# Patient Record
Sex: Male | Born: 1945
Health system: Southern US, Community
[De-identification: ages and names within clinical notes are randomized; demographics above are authoritative.]

## PROBLEM LIST (undated history)

## (undated) DIAGNOSIS — I509 Heart failure, unspecified: Secondary | ICD-10-CM

## (undated) DIAGNOSIS — K219 Gastro-esophageal reflux disease without esophagitis: Secondary | ICD-10-CM

## (undated) DIAGNOSIS — N189 Chronic kidney disease, unspecified: Secondary | ICD-10-CM

## (undated) DIAGNOSIS — E11319 Type 2 diabetes mellitus with unspecified diabetic retinopathy without macular edema: Secondary | ICD-10-CM

## (undated) DIAGNOSIS — I2510 Atherosclerotic heart disease of native coronary artery without angina pectoris: Secondary | ICD-10-CM

## (undated) DIAGNOSIS — E1140 Type 2 diabetes mellitus with diabetic neuropathy, unspecified: Secondary | ICD-10-CM

## (undated) DIAGNOSIS — N183 Chronic kidney disease, stage 3 unspecified: Secondary | ICD-10-CM

## (undated) DIAGNOSIS — M1990 Unspecified osteoarthritis, unspecified site: Secondary | ICD-10-CM

## (undated) DIAGNOSIS — M5030 Other cervical disc degeneration, unspecified cervical region: Secondary | ICD-10-CM

## (undated) DIAGNOSIS — I10 Essential (primary) hypertension: Secondary | ICD-10-CM

## (undated) DIAGNOSIS — E118 Type 2 diabetes mellitus with unspecified complications: Secondary | ICD-10-CM

## (undated) DIAGNOSIS — H409 Unspecified glaucoma: Secondary | ICD-10-CM

## (undated) DIAGNOSIS — D649 Anemia, unspecified: Secondary | ICD-10-CM

## (undated) HISTORY — PX: JOINT REPLACEMENT: SHX530

## (undated) HISTORY — PX: CARDIAC CATHETERIZATION: SHX172

## (undated) HISTORY — PX: CYST EXCISION: SHX5701

## (undated) HISTORY — PX: TOTAL HIP ARTHROPLASTY: SHX124

## (undated) HISTORY — DX: Type 2 diabetes mellitus with unspecified complications: E11.8

## (undated) HISTORY — PX: BACK SURGERY: SHX140

---

## 1997-10-08 ENCOUNTER — Emergency Department (HOSPITAL_COMMUNITY): Admission: EM | Admit: 1997-10-08 | Discharge: 1997-10-08 | Payer: Self-pay | Admitting: Emergency Medicine

## 1997-10-10 ENCOUNTER — Emergency Department (HOSPITAL_COMMUNITY): Admission: EM | Admit: 1997-10-10 | Discharge: 1997-10-10 | Payer: Self-pay | Admitting: Emergency Medicine

## 1999-03-27 ENCOUNTER — Emergency Department (HOSPITAL_COMMUNITY): Admission: EM | Admit: 1999-03-27 | Discharge: 1999-03-27 | Payer: Self-pay | Admitting: Emergency Medicine

## 2000-06-20 ENCOUNTER — Encounter: Payer: Self-pay | Admitting: Pulmonary Disease

## 2000-06-20 ENCOUNTER — Ambulatory Visit (HOSPITAL_COMMUNITY): Admission: RE | Admit: 2000-06-20 | Discharge: 2000-06-20 | Payer: Self-pay | Admitting: Pulmonary Disease

## 2000-08-12 ENCOUNTER — Inpatient Hospital Stay (HOSPITAL_COMMUNITY): Admission: RE | Admit: 2000-08-12 | Discharge: 2000-08-12 | Payer: Self-pay | Admitting: Neurosurgery

## 2000-08-12 ENCOUNTER — Encounter: Payer: Self-pay | Admitting: Neurosurgery

## 2000-10-28 ENCOUNTER — Encounter: Payer: Self-pay | Admitting: Neurosurgery

## 2000-10-28 ENCOUNTER — Ambulatory Visit (HOSPITAL_COMMUNITY): Admission: RE | Admit: 2000-10-28 | Discharge: 2000-10-28 | Payer: Self-pay | Admitting: Neurosurgery

## 2000-11-04 ENCOUNTER — Encounter: Payer: Self-pay | Admitting: Neurosurgery

## 2000-11-04 ENCOUNTER — Encounter: Admission: RE | Admit: 2000-11-04 | Discharge: 2000-11-04 | Payer: Self-pay | Admitting: Neurosurgery

## 2005-05-05 ENCOUNTER — Inpatient Hospital Stay (HOSPITAL_COMMUNITY): Admission: RE | Admit: 2005-05-05 | Discharge: 2005-05-10 | Payer: Self-pay | Admitting: Orthopedic Surgery

## 2005-07-24 ENCOUNTER — Encounter: Admission: RE | Admit: 2005-07-24 | Discharge: 2005-10-22 | Payer: Self-pay | Admitting: Pulmonary Disease

## 2006-08-04 ENCOUNTER — Emergency Department (HOSPITAL_COMMUNITY): Admission: EM | Admit: 2006-08-04 | Discharge: 2006-08-04 | Payer: Self-pay | Admitting: Emergency Medicine

## 2012-06-22 ENCOUNTER — Other Ambulatory Visit (HOSPITAL_COMMUNITY): Payer: Self-pay | Admitting: Pulmonary Disease

## 2012-06-22 DIAGNOSIS — R109 Unspecified abdominal pain: Secondary | ICD-10-CM

## 2012-06-28 ENCOUNTER — Ambulatory Visit (HOSPITAL_COMMUNITY)
Admission: RE | Admit: 2012-06-28 | Discharge: 2012-06-28 | Disposition: A | Discharge status: Home / Self Care | Payer: BC Managed Care – PPO | Source: Ambulatory Visit | Attending: Pulmonary Disease | Admitting: Pulmonary Disease

## 2012-06-28 ENCOUNTER — Other Ambulatory Visit (HOSPITAL_COMMUNITY): Payer: Self-pay | Admitting: Pulmonary Disease

## 2012-06-28 DIAGNOSIS — R109 Unspecified abdominal pain: Secondary | ICD-10-CM

## 2012-06-28 DIAGNOSIS — Z96649 Presence of unspecified artificial hip joint: Secondary | ICD-10-CM | POA: Insufficient documentation

## 2012-06-28 DIAGNOSIS — K7689 Other specified diseases of liver: Secondary | ICD-10-CM | POA: Insufficient documentation

## 2012-06-28 DIAGNOSIS — R319 Hematuria, unspecified: Secondary | ICD-10-CM | POA: Insufficient documentation

## 2012-06-28 MED ORDER — IOHEXOL 300 MG/ML  SOLN
50.0000 mL | Freq: Once | INTRAMUSCULAR | Status: AC | PRN
  Administered 2012-06-28: 50 mL via ORAL

## 2012-06-28 MED ORDER — IOHEXOL 240 MG/ML SOLN: 100.0000 mL | Freq: Once | INTRAMUSCULAR | Status: DC | PRN

## 2012-06-28 MED ORDER — IOHEXOL 300 MG/ML  SOLN
100.0000 mL | Freq: Once | INTRAMUSCULAR | Status: AC | PRN
  Administered 2012-06-28: 100 mL via INTRAVENOUS

## 2013-08-13 ENCOUNTER — Encounter (HOSPITAL_COMMUNITY): Payer: Self-pay | Admitting: Emergency Medicine

## 2013-08-13 ENCOUNTER — Emergency Department (HOSPITAL_COMMUNITY): Payer: BC Managed Care – PPO

## 2013-08-13 ENCOUNTER — Inpatient Hospital Stay (HOSPITAL_COMMUNITY)
Admission: EM | Admit: 2013-08-13 | Discharge: 2013-08-19 | DRG: 392 | Disposition: A | Discharge status: Home / Self Care | Payer: BC Managed Care – PPO | Attending: Internal Medicine | Admitting: Internal Medicine

## 2013-08-13 DIAGNOSIS — R509 Fever, unspecified: Secondary | ICD-10-CM

## 2013-08-13 DIAGNOSIS — Z794 Long term (current) use of insulin: Secondary | ICD-10-CM

## 2013-08-13 DIAGNOSIS — E119 Type 2 diabetes mellitus without complications: Secondary | ICD-10-CM | POA: Diagnosis present

## 2013-08-13 DIAGNOSIS — E876 Hypokalemia: Secondary | ICD-10-CM | POA: Diagnosis present

## 2013-08-13 DIAGNOSIS — R110 Nausea: Secondary | ICD-10-CM

## 2013-08-13 DIAGNOSIS — A088 Other specified intestinal infections: Principal | ICD-10-CM | POA: Diagnosis present

## 2013-08-13 DIAGNOSIS — R51 Headache: Secondary | ICD-10-CM

## 2013-08-13 DIAGNOSIS — M549 Dorsalgia, unspecified: Secondary | ICD-10-CM

## 2013-08-13 DIAGNOSIS — K529 Noninfective gastroenteritis and colitis, unspecified: Secondary | ICD-10-CM

## 2013-08-13 DIAGNOSIS — R5381 Other malaise: Secondary | ICD-10-CM

## 2013-08-13 DIAGNOSIS — I160 Hypertensive urgency: Secondary | ICD-10-CM

## 2013-08-13 DIAGNOSIS — Z23 Encounter for immunization: Secondary | ICD-10-CM

## 2013-08-13 DIAGNOSIS — G8929 Other chronic pain: Secondary | ICD-10-CM

## 2013-08-13 DIAGNOSIS — R109 Unspecified abdominal pain: Secondary | ICD-10-CM

## 2013-08-13 DIAGNOSIS — Z96649 Presence of unspecified artificial hip joint: Secondary | ICD-10-CM

## 2013-08-13 DIAGNOSIS — I517 Cardiomegaly: Secondary | ICD-10-CM | POA: Diagnosis present

## 2013-08-13 DIAGNOSIS — R112 Nausea with vomiting, unspecified: Secondary | ICD-10-CM

## 2013-08-13 DIAGNOSIS — R519 Headache, unspecified: Secondary | ICD-10-CM

## 2013-08-13 DIAGNOSIS — I10 Essential (primary) hypertension: Secondary | ICD-10-CM

## 2013-08-13 DIAGNOSIS — H409 Unspecified glaucoma: Secondary | ICD-10-CM

## 2013-08-13 HISTORY — DX: Unspecified glaucoma: H40.9

## 2013-08-13 HISTORY — DX: Essential (primary) hypertension: I10

## 2013-08-13 LAB — COMPREHENSIVE METABOLIC PANEL
ALK PHOS: 73 U/L (ref 39–117)
ALT: 31 U/L (ref 0–53)
AST: 27 U/L (ref 0–37)
Albumin: 4.4 g/dL (ref 3.5–5.2)
BILIRUBIN TOTAL: 1 mg/dL (ref 0.3–1.2)
BUN: 9 mg/dL (ref 6–23)
CHLORIDE: 94 meq/L — AB (ref 96–112)
CO2: 26 mEq/L (ref 19–32)
Calcium: 10 mg/dL (ref 8.4–10.5)
Creatinine, Ser: 0.69 mg/dL (ref 0.50–1.35)
GFR calc non Af Amer: >90 mL/min (ref >90)
GLUCOSE: 201 mg/dL — AB (ref 70–99)
POTASSIUM: 3.6 meq/L — AB (ref 3.7–5.3)
SODIUM: 138 meq/L (ref 137–147)
TOTAL PROTEIN: 8.6 g/dL — AB (ref 6.0–8.3)

## 2013-08-13 LAB — URINALYSIS, ROUTINE W REFLEX MICROSCOPIC
BILIRUBIN URINE: NEGATIVE
Glucose, UA: >1000 mg/dL — AB
KETONES UR: 40 mg/dL — AB
Leukocytes, UA: NEGATIVE
Nitrite: NEGATIVE
PROTEIN: 100 mg/dL — AB
Specific Gravity, Urine: 1.025 (ref 1.005–1.030)
UROBILINOGEN UA: 0.2 mg/dL (ref 0.0–1.0)
pH: 8 (ref 5.0–8.0)

## 2013-08-13 LAB — I-STAT VENOUS BLOOD GAS, ED
ACID-BASE EXCESS: 3 mmol/L — AB (ref 0.0–2.0)
BICARBONATE: 28.4 meq/L — AB (ref 20.0–24.0)
O2 Saturation: 75 %
Patient temperature: 98.8
TCO2: 30 mmol/L (ref 0–100)
pCO2, Ven: 45.6 mmHg (ref 45.0–50.0)
pH, Ven: 7.402 — ABNORMAL HIGH (ref 7.250–7.300)
pO2, Ven: 41 mmHg (ref 30.0–45.0)

## 2013-08-13 LAB — I-STAT CG4 LACTIC ACID, ED: Lactic Acid, Venous: 1.27 mmol/L (ref 0.5–2.2)

## 2013-08-13 LAB — CBC WITH DIFFERENTIAL/PLATELET
BASOS ABS: 0 10*3/uL (ref 0.0–0.1)
BASOS PCT: 0 % (ref 0–1)
EOS PCT: 0 % (ref 0–5)
Eosinophils Absolute: 0 10*3/uL (ref 0.0–0.7)
HEMATOCRIT: 42.6 % (ref 39.0–52.0)
Hemoglobin: 15.5 g/dL (ref 13.0–17.0)
Lymphocytes Relative: 12 % (ref 12–46)
Lymphs Abs: 1.2 10*3/uL (ref 0.7–4.0)
MCH: 35.3 pg — ABNORMAL HIGH (ref 26.0–34.0)
MCHC: 36.4 g/dL — AB (ref 30.0–36.0)
MCV: 97 fL (ref 78.0–100.0)
MONO ABS: 0.5 10*3/uL (ref 0.1–1.0)
Monocytes Relative: 4 % (ref 3–12)
NEUTROS ABS: 8.6 10*3/uL — AB (ref 1.7–7.7)
Neutrophils Relative %: 84 % — ABNORMAL HIGH (ref 43–77)
PLATELETS: 221 10*3/uL (ref 150–400)
RBC: 4.39 MIL/uL (ref 4.22–5.81)
RDW: 11.8 % (ref 11.5–15.5)
WBC: 10.3 10*3/uL (ref 4.0–10.5)

## 2013-08-13 LAB — URINE MICROSCOPIC-ADD ON

## 2013-08-13 LAB — LIPASE, BLOOD: Lipase: 12 U/L (ref 11–59)

## 2013-08-13 MED ORDER — AMLODIPINE BESYLATE 10 MG PO TABS
10.0000 mg | ORAL_TABLET | Freq: Once | ORAL | Status: AC
  Administered 2013-08-13: 10 mg via ORAL
  Filled 2013-08-13: qty 1

## 2013-08-13 MED ORDER — SODIUM CHLORIDE 0.9 % IV BOLUS (SEPSIS)
1000.0000 mL | Freq: Once | INTRAVENOUS | Status: AC
  Administered 2013-08-13: 1000 mL via INTRAVENOUS

## 2013-08-13 MED ORDER — AMLODIPINE-ATORVASTATIN 10-20 MG PO TABS: 1.0000 | ORAL_TABLET | Freq: Once | ORAL | Status: DC

## 2013-08-13 MED ORDER — ATORVASTATIN CALCIUM 20 MG PO TABS
20.0000 mg | ORAL_TABLET | Freq: Once | ORAL | Status: AC
  Administered 2013-08-13: 20 mg via ORAL
  Filled 2013-08-13: qty 1

## 2013-08-13 MED ORDER — MORPHINE SULFATE 4 MG/ML IJ SOLN
2.0000 mg | Freq: Once | INTRAMUSCULAR | Status: AC
  Administered 2013-08-13: 2 mg via INTRAVENOUS
  Filled 2013-08-13: qty 1

## 2013-08-13 MED ORDER — INSULIN ASPART 100 UNIT/ML ~~LOC~~ SOLN
5.0000 [IU] | Freq: Once | SUBCUTANEOUS | Status: AC
  Administered 2013-08-13: 5 [IU] via SUBCUTANEOUS
  Filled 2013-08-13: qty 1

## 2013-08-13 MED ORDER — ONDANSETRON HCL 4 MG/2ML IJ SOLN
4.0000 mg | Freq: Once | INTRAMUSCULAR | Status: AC
  Administered 2013-08-13: 4 mg via INTRAVENOUS
  Filled 2013-08-13: qty 2

## 2013-08-13 MED ORDER — IOHEXOL 300 MG/ML  SOLN
20.0000 mL | INTRAMUSCULAR | Status: DC
  Administered 2013-08-13: 25 mL via ORAL

## 2013-08-13 MED ORDER — ACETAMINOPHEN 325 MG PO TABS
650.0000 mg | ORAL_TABLET | Freq: Once | ORAL | Status: AC
  Administered 2013-08-13: 650 mg via ORAL
  Filled 2013-08-13: qty 2

## 2013-08-13 NOTE — ED Notes (Signed)
David [name]. At bedside. Reassessing pt. Informed of pain. And nausea

## 2013-08-13 NOTE — ED Notes (Signed)
hes had n/v/abd pain since this morning

## 2013-08-13 NOTE — ED Notes (Signed)
Pt given gingerale and crackers able to keep down but sts still nauseated

## 2013-08-13 NOTE — ED Provider Notes (Signed)
CSN: [id number]     Arrival date & time 08/13/13  1845 History   First MD Initiated Contact with Patient 08/13/13 1856     Chief Complaint  Patient presents with  . Emesis     (Consider location/radiation/quality/duration/timing/severity/associated sxs/prior Treatment) HPI  David [name] is a 68 y.o. male  with a hx of IDDM, HTN (no medications for this today) presents to the Emergency Department complaining of gradual, persistent, progressively worsening general illness onset this morning. 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 Pt reports he slept later than normal this morning and then had not appetite for lunch.  He reports this is the reason he did not take his HTN meds this AM.  Associated symptoms include mild rhinorrhea, fatigue, chills, vomiting x1, dry cough.  Nothing makes it better and nothing makes it worse.  Pt denies fever, neck pain, neck stiffness, weakness, dizziness, syncope, chest pain, SOB, abd pain, rash, dysuria.      Past Medical History  Diagnosis Date  . Diabetes mellitus without complication    Past Surgical History  Procedure Laterality Date  . Joint replacement     History reviewed. No pertinent family history. History  Substance Use Topics  . Smoking status: Never Smoker   . Smokeless tobacco: Not on file  . Alcohol Use: No    Review of Systems  Constitutional: Positive for chills and fatigue. Negative for fever and appetite change.  HENT: Positive for congestion, postnasal drip, rhinorrhea and sinus pressure. Negative for ear discharge, ear pain, mouth sores and sore throat.   Eyes: Negative for visual disturbance.  Respiratory: Positive for cough (dry). Negative for chest tightness, shortness of breath, wheezing and stridor.   Cardiovascular: Negative for chest pain, palpitations and leg swelling.  Gastrointestinal: Negative for nausea, vomiting, abdominal pain and diarrhea.  Genitourinary:  Negative for dysuria, urgency, frequency and hematuria.  Musculoskeletal: Negative for arthralgias, back pain, myalgias and neck stiffness.  Skin: Negative for rash.  Neurological: Positive for headaches (general, throbbing). Negative for syncope, light-headedness and numbness.  Hematological: Negative for adenopathy.  Psychiatric/Behavioral: The patient is not nervous/anxious.   All other systems reviewed and are negative.      Allergies  Review of patient's allergies indicates no known allergies.  Home Medications   Current Outpatient Rx  Name  Route  Sig  Dispense  Refill  . ALPRAZolam (XANAX) 1 MG tablet   Oral   Take 1 mg by mouth daily as needed for anxiety.          Marland Kitchen amlodipine-atorvastatin (CADUET) 10-20 MG per tablet   Oral   Take 1 tablet by mouth daily.         . bimatoprost (LUMIGAN) 0.03 % ophthalmic solution   Both Eyes   Place 1 drop into both eyes at bedtime.         Marland Kitchen FARXIGA 10 MG TABS   Oral   Take 10 mg by mouth daily.         . Hydrocodone-Acetaminophen 7.5-300 MG TABS   Oral   Take 1 tablet by mouth 2 (two) times daily as needed (for pain).          Ernest Mallick FLEXTOUCH 100 UNIT/ML Pen   Subcutaneous   Inject 40 Units into the skin at bedtime.         Marland Kitchen LYRICA 100 MG capsule   Oral   Take 100 mg by mouth 4 (four)  times daily.          . methocarbamol (ROBAXIN) 750 MG tablet   Oral   Take 750 mg by mouth 2 (two) times daily as needed for muscle spasms.          . NON FORMULARY   Injection   Inject 1 each as directed as needed ("cortisone injection").          . NOVOLOG FLEXPEN 100 UNIT/ML FlexPen   Subcutaneous   Inject 10 Units into the skin 3 (three) times daily with meals.         . Oxycodone HCl 20 MG TABS   Oral   Take 20 mg by mouth 2 (two) times daily.          . Tetrahydrozoline HCl (EYE DROPS OP)   Ophthalmic   Apply 2 drops to eye 3 (three) times daily as needed (for dry eyes).          BP  182/82  Pulse 107  Temp(Src) 100.3 F (37.9 C) (Oral)  Resp 18  SpO2 98% Physical Exam  Nursing note and vitals reviewed. Constitutional: He is oriented to person, place, and time. He appears well-developed and well-nourished. No distress.  Awake, alert, nontoxic appearance  HENT:  Head: Normocephalic and atraumatic.  Right Ear: Tympanic membrane, external ear and ear canal normal.  Left Ear: Tympanic membrane, external ear and ear canal normal.  Nose: Mucosal edema and rhinorrhea present. No epistaxis. Right sinus exhibits no maxillary sinus tenderness and no frontal sinus tenderness. Left sinus exhibits no maxillary sinus tenderness and no frontal sinus tenderness.  Mouth/Throat: Uvula is midline, oropharynx is clear and moist and mucous membranes are normal. Mucous membranes are not pale and not cyanotic. No oropharyngeal exudate, posterior oropharyngeal edema, posterior oropharyngeal erythema or tonsillar abscesses.  Eyes: Conjunctivae are normal. Pupils are equal, round, and reactive to light. No scleral icterus.  Neck: Normal range of motion and full passive range of motion without pain. Neck supple.  Full ROM without pain No midline or paraspinal tenderness No nuchal rigidity  Cardiovascular: Normal rate, regular rhythm, normal heart sounds and intact distal pulses.   No murmur heard. Pulmonary/Chest: Effort normal and breath sounds normal. No stridor. No respiratory distress. He has no wheezes.  Abdominal: Soft. Bowel sounds are normal. He exhibits no distension and no mass. There is tenderness in the epigastric area. There is no rebound and no guarding.  Epigastric soreness, but no discrete tenderness and no focal tenderness,  No rigidity, guarding , rebound or peritoneal signs  Musculoskeletal: Normal range of motion. He exhibits no edema.  Lymphadenopathy:    He has no cervical adenopathy.  Neurological: He is alert and oriented to person, place, and time.  Speech is clear  and goal oriented Moves extremities without ataxia  Skin: Skin is warm and dry. No rash noted. He is not diaphoretic. No erythema.  Psychiatric: He has a normal mood and affect.    ED Course  Procedures (including critical care time) Labs Review Labs Reviewed  COMPREHENSIVE METABOLIC PANEL - Abnormal; Notable for the following:    Potassium 3.6 (*)    Chloride 94 (*)    Glucose, Bld 201 (*)    Total Protein 8.6 (*)    All other components within normal limits  CBC WITH DIFFERENTIAL - Abnormal; Notable for the following:    MCH 35.3 (*)    MCHC 36.4 (*)    Neutrophils Relative % 84 (*)    Neutro  Abs 8.6 (*)    All other components within normal limits  URINALYSIS, ROUTINE W REFLEX MICROSCOPIC - Abnormal; Notable for the following:    Glucose, UA >1000 (*)    Hgb urine dipstick TRACE (*)    Ketones, ur 40 (*)    Protein, ur 100 (*)    All other components within normal limits  I-STAT VENOUS BLOOD GAS, ED - Abnormal; Notable for the following:    pH, Ven 7.402 (*)    Bicarbonate 28.4 (*)    Acid-Base Excess 3.0 (*)    All other components within normal limits  LIPASE, BLOOD  URINE MICROSCOPIC-ADD ON  BLOOD GAS, VENOUS  BASIC METABOLIC PANEL  I-STAT CG4 LACTIC ACID, ED   Imaging Review Dg Chest 2 View  08/13/2013   CLINICAL DATA:  Fever.  Chills.  EXAM: CHEST  2 VIEW  COMPARISON:  05/07/2005.  FINDINGS: Poor inspiration. Stable enlarged cardiac silhouette. Clear lungs. Unremarkable bones.  IMPRESSION: No acute abnormality.  Stable cardiomegaly.   Electronically Signed   By: Enrique Sack M.D.   On: 08/13/2013 20:21     EKG Interpretation None      MDM   Final diagnoses:  Fever  Nausea  Abdominal pain   David [name] presents with URI symptoms, chills and generalized fatigue without discrete weakness.  Pt with sick contacts recently.  Will give fluid bolus, check basic labs and re-evaluate.  Pt denies abd pain and reports only 1 episode of NBNB emesis. Abd sore on  exam, but without focal tenderness  10:10 PM Pt appears uncomfortable.  Now with epigastric and RUQ abd pain.  He was found to have low grade fever after several hours in the ED; treated with tylenol.  Pt remains hypertensive without evidence of hypertensive urgency.  Pt given his home BP meds. Liver function, lipase all WNL.  No leukocytosis and UA without evidence of infection.  Pt with AG of 18 and hyperglycemia but no evidence of acidosis on labwork.  Pt has tolerated ginger-ale and crackers without difficulty in the ED prior to developing abd pain.    Will obtain CT scan abd/pelvis.   1:08 AM CT abd/pelvis pending along with Repeat BMP for re-check of elevated anion gap.  Discussed with Antonietta Breach, PA-C and Dr. Reather Converse who will assume care.  Pt given repeat nausea and pain control.    Jarrett Soho Mana Haberl, PA-C 08/14/13 [phone]

## 2013-08-14 ENCOUNTER — Encounter (HOSPITAL_COMMUNITY): Payer: Self-pay | Admitting: Internal Medicine

## 2013-08-14 ENCOUNTER — Emergency Department (HOSPITAL_COMMUNITY): Payer: BC Managed Care – PPO

## 2013-08-14 DIAGNOSIS — R1115 Cyclical vomiting syndrome unrelated to migraine: Secondary | ICD-10-CM

## 2013-08-14 DIAGNOSIS — R509 Fever, unspecified: Secondary | ICD-10-CM

## 2013-08-14 DIAGNOSIS — R112 Nausea with vomiting, unspecified: Secondary | ICD-10-CM

## 2013-08-14 DIAGNOSIS — R519 Headache, unspecified: Secondary | ICD-10-CM | POA: Diagnosis present

## 2013-08-14 DIAGNOSIS — R51 Headache: Secondary | ICD-10-CM

## 2013-08-14 DIAGNOSIS — R109 Unspecified abdominal pain: Secondary | ICD-10-CM

## 2013-08-14 DIAGNOSIS — K529 Noninfective gastroenteritis and colitis, unspecified: Secondary | ICD-10-CM | POA: Diagnosis present

## 2013-08-14 DIAGNOSIS — K5289 Other specified noninfective gastroenteritis and colitis: Secondary | ICD-10-CM

## 2013-08-14 DIAGNOSIS — I160 Hypertensive urgency: Secondary | ICD-10-CM | POA: Diagnosis present

## 2013-08-14 DIAGNOSIS — H409 Unspecified glaucoma: Secondary | ICD-10-CM | POA: Diagnosis present

## 2013-08-14 DIAGNOSIS — R5381 Other malaise: Secondary | ICD-10-CM | POA: Diagnosis present

## 2013-08-14 DIAGNOSIS — M549 Dorsalgia, unspecified: Secondary | ICD-10-CM

## 2013-08-14 DIAGNOSIS — I10 Essential (primary) hypertension: Secondary | ICD-10-CM | POA: Diagnosis present

## 2013-08-14 DIAGNOSIS — G8929 Other chronic pain: Secondary | ICD-10-CM | POA: Diagnosis present

## 2013-08-14 LAB — CBC
HCT: 42.7 % (ref 39.0–52.0)
Hemoglobin: 15.6 g/dL (ref 13.0–17.0)
MCH: 35 pg — ABNORMAL HIGH (ref 26.0–34.0)
MCHC: 36.5 g/dL — ABNORMAL HIGH (ref 30.0–36.0)
MCV: 95.7 fL (ref 78.0–100.0)
PLATELETS: 230 10*3/uL (ref 150–400)
RBC: 4.46 MIL/uL (ref 4.22–5.81)
RDW: 11.8 % (ref 11.5–15.5)
WBC: 12.6 10*3/uL — AB (ref 4.0–10.5)

## 2013-08-14 LAB — BASIC METABOLIC PANEL
BUN: 9 mg/dL (ref 6–23)
BUN: 16 mg/dL (ref 6–23)
CALCIUM: 9.1 mg/dL (ref 8.4–10.5)
CO2: 24 mEq/L (ref 19–32)
CO2: 23 mEq/L (ref 19–32)
CREATININE: 0.66 mg/dL (ref 0.50–1.35)
Calcium: 9.3 mg/dL (ref 8.4–10.5)
Chloride: 95 mEq/L — ABNORMAL LOW (ref 96–112)
Chloride: 99 mEq/L (ref 96–112)
Creatinine, Ser: 0.75 mg/dL (ref 0.50–1.35)
Glucose, Bld: 182 mg/dL — ABNORMAL HIGH (ref 70–99)
Glucose, Bld: 246 mg/dL — ABNORMAL HIGH (ref 70–99)
Potassium: 3.5 mEq/L — ABNORMAL LOW (ref 3.7–5.3)
Potassium: 3.4 mEq/L — ABNORMAL LOW (ref 3.7–5.3)
Sodium: 136 mEq/L — ABNORMAL LOW (ref 137–147)
Sodium: 139 mEq/L (ref 137–147)

## 2013-08-14 LAB — MRSA PCR SCREENING: MRSA by PCR: NEGATIVE

## 2013-08-14 LAB — INFLUENZA PANEL BY PCR (TYPE A & B)
H1N1 flu by pcr: NOT DETECTED
Influenza A By PCR: NEGATIVE
Influenza B By PCR: NEGATIVE

## 2013-08-14 LAB — GLUCOSE, CAPILLARY
GLUCOSE-CAPILLARY: 294 mg/dL — AB (ref 70–99)
Glucose-Capillary: 201 mg/dL — ABNORMAL HIGH (ref 70–99)
Glucose-Capillary: 253 mg/dL — ABNORMAL HIGH (ref 70–99)

## 2013-08-14 LAB — CREATININE, SERUM: CREATININE: 0.71 mg/dL (ref 0.50–1.35)

## 2013-08-14 LAB — HEMOGLOBIN A1C
Hgb A1c MFr Bld: 7.7 % — ABNORMAL HIGH (ref <5.7)
Mean Plasma Glucose: 174 mg/dL — ABNORMAL HIGH (ref <117)

## 2013-08-14 LAB — CBG MONITORING, ED: Glucose-Capillary: 251 mg/dL — ABNORMAL HIGH (ref 70–99)

## 2013-08-14 LAB — PHOSPHORUS: PHOSPHORUS: 3 mg/dL (ref 2.3–4.6)

## 2013-08-14 LAB — MAGNESIUM: MAGNESIUM: 1.9 mg/dL (ref 1.5–2.5)

## 2013-08-14 MED ORDER — NICARDIPINE HCL IN NACL 20-0.86 MG/200ML-% IV SOLN
3.0000 mg/h | INTRAVENOUS | Status: DC
Start: 2013-08-14 — End: 2013-08-16
  Administered 2013-08-14 (×2): 7.5 mg/h via INTRAVENOUS
  Administered 2013-08-14 (×2): 5 mg/h via INTRAVENOUS
  Administered 2013-08-15: 3 mg/h via INTRAVENOUS
  Administered 2013-08-15: 5 mg/h via INTRAVENOUS
  Filled 2013-08-14 (×7): qty 200

## 2013-08-14 MED ORDER — SODIUM CHLORIDE 0.9 % IV SOLN
Freq: Once | INTRAVENOUS | Status: AC
  Administered 2013-08-14: 06:00:00 via INTRAVENOUS

## 2013-08-14 MED ORDER — ASPIRIN 300 MG RE SUPP: 300.0000 mg | RECTAL | Status: AC

## 2013-08-14 MED ORDER — INSULIN DETEMIR 100 UNIT/ML ~~LOC~~ SOLN
40.0000 [IU] | Freq: Every day | SUBCUTANEOUS | Status: DC
  Administered 2013-08-14 – 2013-08-15 (×2): 40 [IU] via SUBCUTANEOUS
  Filled 2013-08-14 (×3): qty 0.4

## 2013-08-14 MED ORDER — INSULIN ASPART 100 UNIT/ML ~~LOC~~ SOLN
0.0000 [IU] | Freq: Three times a day (TID) | SUBCUTANEOUS | Status: DC
Start: 2013-08-14 — End: 2013-08-19
  Administered 2013-08-14 (×2): 8 [IU] via SUBCUTANEOUS
  Administered 2013-08-14: 5 [IU] via SUBCUTANEOUS
  Administered 2013-08-15 (×2): 3 [IU] via SUBCUTANEOUS
  Administered 2013-08-15: 5 [IU] via SUBCUTANEOUS
  Administered 2013-08-16: 2 [IU] via SUBCUTANEOUS
  Administered 2013-08-16: 3 [IU] via SUBCUTANEOUS
  Administered 2013-08-17 (×2): 5 [IU] via SUBCUTANEOUS
  Administered 2013-08-17: 3 [IU] via SUBCUTANEOUS
  Administered 2013-08-18: 8 [IU] via SUBCUTANEOUS
  Administered 2013-08-18: 3 [IU] via SUBCUTANEOUS
  Administered 2013-08-18: 8 [IU] via SUBCUTANEOUS
  Administered 2013-08-19: 3 [IU] via SUBCUTANEOUS
  Administered 2013-08-19: 5 [IU] via SUBCUTANEOUS

## 2013-08-14 MED ORDER — INSULIN DETEMIR 100 UNIT/ML FLEXPEN: 40.0000 [IU] | PEN_INJECTOR | Freq: Every day | SUBCUTANEOUS | Status: DC

## 2013-08-14 MED ORDER — ALPRAZOLAM 0.5 MG PO TABS
0.5000 mg | ORAL_TABLET | Freq: Two times a day (BID) | ORAL | Status: DC | PRN
  Administered 2013-08-14 – 2013-08-18 (×6): 0.5 mg via ORAL
  Filled 2013-08-14 (×7): qty 1

## 2013-08-14 MED ORDER — INSULIN DETEMIR 100 UNIT/ML ~~LOC~~ SOLN: 20.0000 [IU] | Freq: Every day | SUBCUTANEOUS | Status: DC

## 2013-08-14 MED ORDER — IOHEXOL 300 MG/ML  SOLN
100.0000 mL | Freq: Once | INTRAMUSCULAR | Status: AC | PRN
  Administered 2013-08-14: 100 mL via INTRAVENOUS

## 2013-08-14 MED ORDER — INSULIN ASPART 100 UNIT/ML ~~LOC~~ SOLN: 0.0000 [IU] | Freq: Every day | SUBCUTANEOUS | Status: DC

## 2013-08-14 MED ORDER — TETRAHYDROZOLINE HCL 0.05 % OP SOLN: 2.0000 [drp] | Freq: Three times a day (TID) | OPHTHALMIC | Status: DC | PRN

## 2013-08-14 MED ORDER — AMLODIPINE-ATORVASTATIN 10-20 MG PO TABS: 1.0000 | ORAL_TABLET | Freq: Once | ORAL | Status: DC

## 2013-08-14 MED ORDER — INSULIN ASPART 100 UNIT/ML ~~LOC~~ SOLN: 0.0000 [IU] | Freq: Three times a day (TID) | SUBCUTANEOUS | Status: DC

## 2013-08-14 MED ORDER — POTASSIUM CHLORIDE 10 MEQ/100ML IV SOLN
10.0000 meq | INTRAVENOUS | Status: AC
  Administered 2013-08-14 (×3): 10 meq via INTRAVENOUS
  Filled 2013-08-14 (×2): qty 100

## 2013-08-14 MED ORDER — HEPARIN SODIUM (PORCINE) 5000 UNIT/ML IJ SOLN
5000.0000 [IU] | Freq: Three times a day (TID) | INTRAMUSCULAR | Status: DC
Start: 2013-08-14 — End: 2013-08-15
  Administered 2013-08-14 – 2013-08-15 (×3): 5000 [IU] via SUBCUTANEOUS
  Filled 2013-08-14 (×6): qty 1

## 2013-08-14 MED ORDER — HYDRALAZINE HCL 20 MG/ML IJ SOLN: 20.0000 mg | Freq: Four times a day (QID) | INTRAMUSCULAR | Status: DC | PRN

## 2013-08-14 MED ORDER — AMLODIPINE BESYLATE 10 MG PO TABS
10.0000 mg | ORAL_TABLET | Freq: Once | ORAL | Status: AC
  Administered 2013-08-14: 10 mg via ORAL
  Filled 2013-08-14: qty 1

## 2013-08-14 MED ORDER — POTASSIUM CHLORIDE IN NACL 20-0.45 MEQ/L-% IV SOLN
INTRAVENOUS | Status: DC
  Administered 2013-08-14: 11:00:00 via INTRAVENOUS
  Filled 2013-08-14 (×3): qty 1000

## 2013-08-14 MED ORDER — ATORVASTATIN CALCIUM 20 MG PO TABS
20.0000 mg | ORAL_TABLET | Freq: Once | ORAL | Status: AC
  Administered 2013-08-14: 20 mg via ORAL
  Filled 2013-08-14: qty 1

## 2013-08-14 MED ORDER — PROMETHAZINE HCL 25 MG/ML IJ SOLN
12.5000 mg | Freq: Once | INTRAMUSCULAR | Status: AC
  Administered 2013-08-14: 12.5 mg via INTRAVENOUS

## 2013-08-14 MED ORDER — METOPROLOL TARTRATE 1 MG/ML IV SOLN
5.0000 mg | Freq: Once | INTRAVENOUS | Status: AC
  Administered 2013-08-14: 5 mg via INTRAVENOUS
  Filled 2013-08-14: qty 5

## 2013-08-14 MED ORDER — ONDANSETRON HCL 4 MG/2ML IJ SOLN
4.0000 mg | Freq: Four times a day (QID) | INTRAMUSCULAR | Status: DC | PRN
  Administered 2013-08-14 – 2013-08-15 (×3): 4 mg via INTRAVENOUS
  Filled 2013-08-14 (×3): qty 2

## 2013-08-14 MED ORDER — PROMETHAZINE HCL 25 MG/ML IJ SOLN
12.5000 mg | Freq: Once | INTRAMUSCULAR | Status: AC
  Administered 2013-08-14: 12.5 mg via INTRAVENOUS
  Filled 2013-08-14: qty 1

## 2013-08-14 MED ORDER — IBUPROFEN 200 MG PO TABS
600.0000 mg | ORAL_TABLET | Freq: Once | ORAL | Status: AC
  Administered 2013-08-14: 600 mg via ORAL
  Filled 2013-08-14: qty 3

## 2013-08-14 MED ORDER — LORAZEPAM 2 MG/ML IJ SOLN
1.0000 mg | Freq: Once | INTRAMUSCULAR | Status: AC
  Administered 2013-08-14: 1 mg via INTRAVENOUS
  Filled 2013-08-14: qty 1

## 2013-08-14 MED ORDER — LABETALOL HCL 100 MG PO TABS
100.0000 mg | ORAL_TABLET | Freq: Two times a day (BID) | ORAL | Status: DC
  Administered 2013-08-14 – 2013-08-16 (×5): 100 mg via ORAL
  Filled 2013-08-14 (×6): qty 1

## 2013-08-14 MED ORDER — ONDANSETRON HCL 4 MG/2ML IJ SOLN
4.0000 mg | Freq: Once | INTRAMUSCULAR | Status: AC
  Administered 2013-08-14: 4 mg via INTRAVENOUS
  Filled 2013-08-14: qty 2

## 2013-08-14 MED ORDER — ASPIRIN 81 MG PO CHEW
324.0000 mg | CHEWABLE_TABLET | ORAL | Status: AC
  Administered 2013-08-14: 324 mg via ORAL
  Filled 2013-08-14: qty 4

## 2013-08-14 MED ORDER — MORPHINE SULFATE 4 MG/ML IJ SOLN
4.0000 mg | Freq: Once | INTRAMUSCULAR | Status: AC
  Administered 2013-08-14: 4 mg via INTRAVENOUS
  Filled 2013-08-14: qty 1

## 2013-08-14 MED ORDER — HYDRALAZINE HCL 20 MG/ML IJ SOLN
10.0000 mg | Freq: Once | INTRAMUSCULAR | Status: AC
  Administered 2013-08-14: 10 mg via INTRAVENOUS
  Filled 2013-08-14: qty 1

## 2013-08-14 NOTE — Consult Note (Signed)
PULMONARY / CRITICAL CARE MEDICINE   Name: David [name] MRN: [id number] DOB: 01/27/1946    ADMISSION DATE:  08/13/2013 CONSULTATION DATE:  08/14/2013   REFERRING MD :  TRH/EDP PRIMARY SERVICE: PCCM  CHIEF COMPLAINT:  Nausea and vomiting  BRIEF PATIENT DESCRIPTION: 68 y/o male with chronic pain, hypertension and DM2 was admitted to [facility] with hypertensive urgency in the setting of persistent nausea and vomiting due to gastroenteritis.  SIGNIFICANT EVENTS / STUDIES:  3/29 CT abd/pelvis>> possible cystitis, otherwise unremarkable study   LINES / TUBES:   CULTURES: 3/29 influenza pcr >>  ANTIBIOTICS:   HISTORY OF PRESENT ILLNESS:  68 y/o male with chronic pain, hypertension and DM2 was admitted to [facility] with hypertensive urgency in the setting of persistent nausea and vomiting due to gastroenteritis.  His wife provides primarily provides the history because the patient is sleepy from ativan and phenergan.  She stated that he developed the sudden onset of fever, chills, myalgias, headache, nausea, vomiting and abdominal cramping on 3/28.  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  He also had a cough.  He denied diarrhea, weakness, or chest pain.  He was unable to keep down any of his medicines.  He came to the ED on 3/28 because he felt terrible.  In the ED he has been given IV fluids, pain medicine, labetalol, hydralazine, phenergan, zofran, and ativan.  Despite this he has still been unable to tolerate PO intake and his blood pressure has been markedly elevated.  His peak MAP was 134.  PCCM called for consult due to need for continuous IV medicine for his blood pressure.  PAST MEDICAL HISTORY :  Past Medical History  Diagnosis Date  . Diabetes mellitus without complication   . Hypertension   . Glaucoma    Past Surgical History  Procedure Laterality Date  . Joint replacement    . Back surgery    . Cyst excision      on Back  . Total hip arthroplasty       Right    Prior to Admission medications   Medication Sig Start Date End Date Taking? Authorizing Provider  ALPRAZolam Duanne Moron) 1 MG tablet Take 1 mg by mouth daily as needed for anxiety.  05/23/13  Yes Historical Provider, MD  amlodipine-atorvastatin (CADUET) 10-20 MG per tablet Take 1 tablet by mouth daily. 08/09/13  Yes Historical Provider, MD  bimatoprost (LUMIGAN) 0.03 % ophthalmic solution Place 1 drop into both eyes at bedtime.   Yes Historical Provider, MD  FARXIGA 10 MG TABS Take 10 mg by mouth daily. 08/07/13  Yes Historical Provider, MD  Hydrocodone-Acetaminophen 7.5-300 MG TABS Take 1 tablet by mouth 2 (two) times daily as needed (for pain).  07/22/13  Yes Historical Provider, MD  LEVEMIR FLEXTOUCH 100 UNIT/ML Pen Inject 40 Units into the skin at bedtime. 08/04/13  Yes Historical Provider, MD  LYRICA 100 MG capsule Take 100 mg by mouth 4 (four) times daily.  08/01/13  Yes Historical Provider, MD  methocarbamol (ROBAXIN) 750 MG tablet Take 750 mg by mouth 2 (two) times daily as needed for muscle spasms.  06/11/13  Yes Historical Provider, MD  NON FORMULARY Inject 1 each as directed as needed ("cortisone injection").    Yes Historical Provider, MD  NOVOLOG FLEXPEN 100 UNIT/ML FlexPen Inject 10 Units into the skin 3 (three) times daily with meals. 07/28/13  Yes Historical Provider, MD  Oxycodone HCl 20 MG TABS Take 20 mg by  mouth 2 (two) times daily.  07/22/13  Yes Historical Provider, MD  Tetrahydrozoline HCl (EYE DROPS OP) Apply 2 drops to eye 3 (three) times daily as needed (for dry eyes).   Yes Historical Provider, MD   No Known Allergies  FAMILY HISTORY:  History reviewed. No pertinent family history. SOCIAL HISTORY:  reports that he has never smoked. He does not have any smokeless tobacco history on file. He reports that he does not drink alcohol or use illicit drugs.  REVIEW OF SYSTEMS:   Gen: per HPI HEENT: Denies blurred vision, double vision, hearing loss, tinnitus, sinus congestion,  rhinorrhea, sore throat, neck stiffness, dysphagia PULM: Denies shortness of breath, + cough, denies sputum production, hemoptysis, wheezing CV: Denies chest pain, edema, orthopnea, paroxysmal nocturnal dyspnea, palpitations GI: Per HPI GU: Denies dysuria, hematuria, polyuria, oliguria, urethral discharge Endocrine: Denies hot or cold intolerance, polyuria, polyphagia or appetite change Derm: Denies rash, dry skin, scaling or peeling skin change Heme: Denies easy bruising, bleeding, bleeding gums Neuro: Notes headache, but denies numbness, weakness, slurred speech, loss of memory or consciousness   SUBJECTIVE:   VITAL SIGNS: Temp:  [98.8 F (37.1 C)-100.3 F (37.9 C)] 98.8 F (37.1 C) (03/29 0125) Pulse Rate:  [99-113] 105 (03/29 0605) Resp:  [18-21] 21 (03/29 0605) BP: (169-214)/(82-106) 190/99 mmHg (03/29 0605) SpO2:  [97 %-100 %] 98 % (03/29 0605) HEMODYNAMICS:   VENTILATOR SETTINGS:   INTAKE / OUTPUT: Intake/Output     03/28 0701 - 03/29 0700 03/29 0701 - 03/30 0700   Urine 1500    Total Output 1500     Net -1500            PHYSICAL EXAMINATION:  Gen: fatigued, asleep but arouses and nods head to question, follows commands HEENT: NCAT,  EOMi, OP clear, neck supple without masses PULM: CTA B CV: RRR, S4 noted, no JVD AB: BS+, soft, nontender, no hsm Ext: warm, no edema, no clubbing, no cyanosis Derm: no rash or skin breakdown Neuro: A&Ox4, CN II-XII intact, strength 5/5 in all 4 extremities   LABS:  CBC  Recent Labs Lab 08/13/13 1911  WBC 10.3  HGB 15.5  HCT 42.6  PLT 221   Coag's No results found for this basename: APTT, INR,  in the last 168 hours BMET  Recent Labs Lab 08/13/13 1911 08/14/13 0221  NA 138 136*  K 3.6* 3.5*  CL 94* 95*  CO2 26 24  BUN 9 9  CREATININE 0.69 0.66  GLUCOSE 201* 182*   Electrolytes  Recent Labs Lab 08/13/13 1911 08/14/13 0221  CALCIUM 10.0 9.3   Sepsis Markers  Recent Labs Lab 08/13/13 2151   LATICACIDVEN 1.27   ABG No results found for this basename: PHART, PCO2ART, PO2ART,  in the last 168 hours Liver Enzymes  Recent Labs Lab 08/13/13 1911  AST 27  ALT 31  ALKPHOS 73  BILITOT 1.0  ALBUMIN 4.4   Cardiac Enzymes No results found for this basename: TROPONINI, PROBNP,  in the last 168 hours Glucose No results found for this basename: GLUCAP,  in the last 168 hours  Imaging  EKG: Sinus tach (100), QTc 486, no ST wave changes  CXR: Cardiomegally, no other changes  ASSESSMENT / PLAN:  CARDIOVASCULAR A: Hypertensive urgency> no evidence of end organ damage; likely precipitated by inability to take home pain and BP meds Cardiomegaly on CXR, never had echocardiogram P:  -nicardipine drip titrated to goal MAP 100 (25% reduction) for first 24 hours -continue home  amlodipine -start labetalol -ICU monitoring -echo given cardiomegaly   PULMONARY A: No acute issues P:   -monitor O2  RENAL A:  Hypokalemia in setting of nausea and vomiting P:   -IVF with 20 mEq KCL  GASTROINTESTINAL A:  Nausea and vomiting most likely due to viral gastroenteritis, normal abdominal exam and CT abdomen P:   -IVF -advance diet -prn zofran  HEMATOLOGIC A:  No acute issues P:  -monitor for bleeding  INFECTIOUS A:  Viral gastroenteritis P:   -f/u flu pcr  ENDOCRINE A:  DM2 P:   -SSI AC/HS (may need to change to q4h if not able to eat this morning)  NEUROLOGIC A:  Chronic pain Headache without focal neurologic sign P:   -restart home robaxin, pregabalin, oxycodone   TODAY'S SUMMARY: gastroenteritis, hypertensive urgency  Jillyn Hidden PCCM Pager: [phone] Cell: [phone] If no response, call [phone]  08/14/2013, 7:40 AM

## 2013-08-14 NOTE — ED Notes (Signed)
Family at bedside. 

## 2013-08-14 NOTE — ED Provider Notes (Signed)
Medical screening examination/treatment/procedure(s) were performed by non-physician practitioner and as supervising physician I was immediately available for consultation/collaboration.   Dot Lanes, MD 08/14/13 1250

## 2013-08-14 NOTE — H&P (Signed)
[facility] History and Physical  David [name] [id number] DOB: 1945/09/27 DOA: 08/13/2013  Referring physician: EDP PCP: Leola Brazil, MD  Specialists:   Chief Complaint: Nausea and Vomiting  HPI: David [name] is a 68 y.o. male with a history of HTN, DM2, and Glaucoma who presents to the ED with complaints  Of Intractable  N+V since 1 pm yesterday.    He has had fevers and chills and headache.  He denies having any hematemesis.   He reports epigastric area ABD Pain from his increased vomiting.   He reports that he was exposed to a student on Friday with similar symptoms   Review of Systems:  Constitutional: No Weight Loss, No Weight Gain, Night Sweats, Fevers, Chills, Fatigue, or Generalized Weakness HEENT: +Headaches, Difficulty Swallowing,Tooth/Dental Problems,Sore Throat,  No Sneezing, Rhinitis, Ear Ache, Nasal Congestion, or Post Nasal Drip,  Cardio-vascular:  No Chest pain, Orthopnea, PND, Edema in lower extremities, Anasarca, Dizziness, Palpitations  Resp: No Dyspnea, No DOE, No Productive Cough, No Non-Productive Cough, No Hemoptysis, No Change in Color of Mucus,  No Wheezing.    GI: No Heartburn, Indigestion, +Abdominal Pain, +Nausea, +Vomiting, No Hematemesis, No Diarrhea, Change in Bowel Habits,  Loss of Appetite  GU: No Dysuria, Change in Color of Urine, No Urgency or Frequency.  No flank pain.  Musculoskeletal: +Hip and Back Pain, No Joint Swelling.  No Decreased Range of Motion. No Back Pain.  Neurologic: No Syncope, No Seizures, Muscle Weakness, Paresthesia, Vision Disturbance or Loss, No Diplopia, No Vertigo, No Difficulty Walking,  Skin: No Rash or Lesions. Psych: No Change in Mood or Affect. No Depression or Anxiety. No Memory loss. No Confusion or Hallucinations   Past Medical History  Diagnosis Date  . Diabetes mellitus without complication   . Hypertension   . Glaucoma      Past Surgical History  Procedure Laterality Date  . Joint  replacement    . Back surgery    . Cyst excision      on Back  . Total hip arthroplasty      Right       Prior to Admission medications   Medication Sig Start Date End Date Taking? Authorizing Provider  ALPRAZolam Duanne Moron) 1 MG tablet Take 1 mg by mouth daily as needed for anxiety.  05/23/13  Yes Historical Provider, MD  amlodipine-atorvastatin (CADUET) 10-20 MG per tablet Take 1 tablet by mouth daily. 08/09/13  Yes Historical Provider, MD  bimatoprost (LUMIGAN) 0.03 % ophthalmic solution Place 1 drop into both eyes at bedtime.   Yes Historical Provider, MD  FARXIGA 10 MG TABS Take 10 mg by mouth daily. 08/07/13  Yes Historical Provider, MD  Hydrocodone-Acetaminophen 7.5-300 MG TABS Take 1 tablet by mouth 2 (two) times daily as needed (for pain).  07/22/13  Yes Historical Provider, MD  LEVEMIR FLEXTOUCH 100 UNIT/ML Pen Inject 40 Units into the skin at bedtime. 08/04/13  Yes Historical Provider, MD  LYRICA 100 MG capsule Take 100 mg by mouth 4 (four) times daily.  08/01/13  Yes Historical Provider, MD  methocarbamol (ROBAXIN) 750 MG tablet Take 750 mg by mouth 2 (two) times daily as needed for muscle spasms.  06/11/13  Yes Historical Provider, MD  NON FORMULARY Inject 1 each as directed as needed ("cortisone injection").    Yes Historical Provider, MD  NOVOLOG FLEXPEN 100 UNIT/ML FlexPen Inject 10 Units into the skin 3 (three) times daily with meals. 07/28/13  Yes Historical Provider, MD  Oxycodone HCl 20  MG TABS Take 20 mg by mouth 2 (two) times daily.  07/22/13  Yes Historical Provider, MD  Tetrahydrozoline HCl (EYE DROPS OP) Apply 2 drops to eye 3 (three) times daily as needed (for dry eyes).   Yes Historical Provider, MD     No Known Allergies   Social History:  Married  reports that he has never smoked. He does not have any smokeless tobacco history on file. He reports that he does not drink alcohol or use illicit drugs.     History reviewed. No pertinent family history.     Physical  Exam:  GEN:  Ill appearing Obese 68 y.o. African [place] male examined and in no acute distress; cooperative with exam Filed Vitals:   08/14/13 0000 08/14/13 0125 08/14/13 0200 08/14/13 0430  BP: 182/82  209/101 203/105  Pulse: 107  109 113  Temp:  98.8 F (37.1 C)    TempSrc:  Oral    Resp:      SpO2: 98%  97% 98%   Blood pressure 203/105, pulse 113, temperature 98.8 F (37.1 C), temperature source Oral, resp. rate 18, SpO2 98.00%. PSYCH: He is alert and oriented x4; does not appear anxious does not appear depressed; affect is normal HEENT: Normocephalic and Atraumatic, Mucous membranes pink; PERRLA; EOM intact; Fundi:  Benign;  No scleral icterus, Nares: Patent, Oropharynx: Clear, Fair Dentition, Neck:  FROM, no cervical lymphadenopathy nor thyromegaly or carotid bruit; no JVD; Breasts:: Not examined CHEST WALL: No tenderness CHEST: Normal respiration, clear to auscultation bilaterally HEART: Regular rate and rhythm; no murmurs rubs or gallops BACK: No kyphosis or scoliosis; no CVA tenderness ABDOMEN: Positive Bowel Sounds, Obese, soft non-tender; no masses, no organomegaly, no pannus; no intertriginous candida. Rectal Exam: Not done EXTREMITIES: No cyanosis, clubbing or edema; no ulcerations. Genitalia: not examined PULSES: 2+ and symmetric SKIN: Normal hydration no rash or ulceration CNS:  Alert and Oriented X 4, No Focal Deficits.    Vascular: pulses palpable throughout    Labs on Admission:  Basic Metabolic Panel:  Recent Labs Lab 08/13/13 1911 08/14/13 0221  NA 138 136*  K 3.6* 3.5*  CL 94* 95*  CO2 26 24  GLUCOSE 201* 182*  BUN 9 9  CREATININE 0.69 0.66  CALCIUM 10.0 9.3   Liver Function Tests:  Recent Labs Lab 08/13/13 1911  AST 27  ALT 31  ALKPHOS 73  BILITOT 1.0  PROT 8.6*  ALBUMIN 4.4    Recent Labs Lab 08/13/13 1911  LIPASE 12   No results found for this basename: AMMONIA,  in the last 168 hours CBC:  Recent Labs Lab 08/13/13 1911   WBC 10.3  NEUTROABS 8.6*  HGB 15.5  HCT 42.6  MCV 97.0  PLT 221   Cardiac Enzymes: No results found for this basename: CKTOTAL, CKMB, CKMBINDEX, TROPONINI,  in the last 168 hours  BNP (last 3 results) No results found for this basename: PROBNP,  in the last 8760 hours CBG: No results found for this basename: GLUCAP,  in the last 168 hours  Radiological Exams on Admission: Dg Chest 2 View  08/13/2013   CLINICAL DATA:  Fever.  Chills.  EXAM: CHEST  2 VIEW  COMPARISON:  05/07/2005.  FINDINGS: Poor inspiration. Stable enlarged cardiac silhouette. Clear lungs. Unremarkable bones.  IMPRESSION: No acute abnormality.  Stable cardiomegaly.   Electronically Signed   By: Enrique Sack M.D.   On: 08/13/2013 20:21   Ct Abdomen Pelvis W Contrast  08/14/2013   CLINICAL DATA:  Abdominal pain and emesis  EXAM: CT ABDOMEN AND PELVIS WITH CONTRAST  TECHNIQUE: Multidetector CT imaging of the abdomen and pelvis was performed using the standard protocol following bolus administration of intravenous contrast.  CONTRAST:  136mL OMNIPAQUE IOHEXOL 300 MG/ML  SOLN  COMPARISON:  06/28/2012  FINDINGS: BODY WALL: Unremarkable.  LOWER CHEST: Mild, patchy air trapping in the lower lungs. Diffuse coronary atherosclerosis. Borderline cardiomegaly.  ABDOMEN/PELVIS:  Liver: No focal abnormality.  Biliary: No evidence of biliary obstruction or stone.  Pancreas: Diffuse atrophy.  No ductal dilatation.  Spleen: Unremarkable.  Adrenals: Unremarkable.  Kidneys and ureters: No hydronephrosis or stone.  Bladder: Mild haziness of the pericystic fat with questionable circumferential wall thickening.  Reproductive: Unremarkable.  Bowel: No obstruction. Normal appendix.  Few colonic diverticula.  Retroperitoneum: No mass or adenopathy.  Peritoneum: No free fluid or gas.  Vascular: No acute abnormality.  OSSEOUS: Advanced lower lumbar degenerative disc and facet disease, with canal and foraminal stenosis most notable at L4-5 and L5-S1. Total  right hip arthroplasty. No adverse features.  IMPRESSION: 1. Possible cystitis.  Correlate with urinalysis. 2. Otherwise, no evidence of acute intra-abdominal disease.   Electronically Signed   By: Jorje Guild M.D.   On: 08/14/2013 01:44      EKG: Independently reviewed. Sinus Rhythm with occasional PVCs, LVH changes, No acute S-T changes.       Assessment/Plan:   68 y.o. male with  Principal Problem:   Acute gastroenteritis Active Problems:   Intractable nausea and vomiting   Malaise   Hypertension   Chronic back pain   Glaucoma   Headache    1.   Acute Gastroenteritis/ Intractable N+V- IV Anti-Emetics PRN, and IVFs.     2.   Hypertension-   Increased due to pain and Headache and #1-  Hydralazine IV PRN.    3.   Chronic Back Pain-  continue Home Pain Regimen, IV Dilaudid PRN.    4.    Glaucoma- Continue Ophthalmic Drops as prescribed.    5.    Headache- due to #2, IV dilaudid for Pain control PRN, and Blood Pressure control.     6.    DVT prophylaxis with Lovenox.       Code Status: FULL CODE   Family Communication:     Disposition Plan:         Time spent:  [place] [facility] Pager [phone]  If 7PM-7AM, please contact night-coverage [email or web address] Password [facility] 08/14/2013, 5:57 AM

## 2013-08-14 NOTE — ED Notes (Signed)
Returned from [place]. More comfortable. C/o feeling warm. Afebrile.

## 2013-08-14 NOTE — ED Provider Notes (Signed)
Medical screening examination/treatment/procedure(s) were performed by non-physician practitioner and as supervising physician I was immediately available for consultation/collaboration.   EKG Interpretation None        Mariea Clonts, MD 08/14/13 [phone]

## 2013-08-14 NOTE — ED Provider Notes (Signed)
Patient care assumed from [facility], PA-C at shift change. Patient presents for symptoms of general illness with malaise, cough, N/V, myalgias, and chills. Patient with CT abd/pelvis and repeat BMP pending. Plan to disposition based on results of pending work up.  Patient still with mildly elevated anion gap of 17, down from 18. Lactate normal. CT suggests possible cystitis; however, UA findings not consistent with this diagnosis. Patient continues to complain of pain "all over" as well as a headache. Hypertension has recurred for which patient was given IV Lopressor. Have also ordered additional IVF to be administered. Suspect influenza-like illness as cause of symptoms today. Given no improvement in patient's condition over 10 hour ED course including persistent pain, nausea/emesis, persistent HTN, and tachycardia, will admit to [facility]. Have discussed patient case with Dr. Arnoldo Morale who will admit for observation. Temp admit orders placed.   Filed Vitals:   08/14/13 0000 08/14/13 0125 08/14/13 0200 08/14/13 0430  BP: 182/82  209/101 203/105  Pulse: 107  109 113  Temp:  98.8 F (37.1 C)    TempSrc:  Oral    Resp:      SpO2: 98%  97% 98%     Antonietta Breach, PA-C 08/14/13 [phone]

## 2013-08-14 NOTE — ED Notes (Signed)
Pts. bp still labile. Dr. Arnoldo Morale made aware.

## 2013-08-14 NOTE — ED Notes (Signed)
Lab. Dian Situ bmp.

## 2013-08-15 ENCOUNTER — Encounter (HOSPITAL_COMMUNITY): Payer: Self-pay

## 2013-08-15 LAB — CBC
HEMATOCRIT: 41.5 % (ref 39.0–52.0)
HEMOGLOBIN: 15 g/dL (ref 13.0–17.0)
MCH: 34.9 pg — ABNORMAL HIGH (ref 26.0–34.0)
MCHC: 36.1 g/dL — ABNORMAL HIGH (ref 30.0–36.0)
MCV: 96.5 fL (ref 78.0–100.0)
Platelets: 228 10*3/uL (ref 150–400)
RBC: 4.3 MIL/uL (ref 4.22–5.81)
RDW: 12 % (ref 11.5–15.5)
WBC: 11.6 10*3/uL — AB (ref 4.0–10.5)

## 2013-08-15 LAB — GLUCOSE, CAPILLARY
GLUCOSE-CAPILLARY: 177 mg/dL — AB (ref 70–99)
GLUCOSE-CAPILLARY: 209 mg/dL — AB (ref 70–99)
GLUCOSE-CAPILLARY: 225 mg/dL — AB (ref 70–99)
Glucose-Capillary: 173 mg/dL — ABNORMAL HIGH (ref 70–99)
Glucose-Capillary: 192 mg/dL — ABNORMAL HIGH (ref 70–99)

## 2013-08-15 LAB — BASIC METABOLIC PANEL
BUN: 18 mg/dL (ref 6–23)
CO2: 24 meq/L (ref 19–32)
Calcium: 9.2 mg/dL (ref 8.4–10.5)
Chloride: 99 mEq/L (ref 96–112)
Creatinine, Ser: 0.72 mg/dL (ref 0.50–1.35)
GFR calc non Af Amer: >90 mL/min (ref >90)
GLUCOSE: 210 mg/dL — AB (ref 70–99)
POTASSIUM: 3.5 meq/L — AB (ref 3.7–5.3)
Sodium: 138 mEq/L (ref 137–147)

## 2013-08-15 LAB — PHOSPHORUS: Phosphorus: 2.2 mg/dL — ABNORMAL LOW (ref 2.3–4.6)

## 2013-08-15 LAB — MAGNESIUM: Magnesium: 2.1 mg/dL (ref 1.5–2.5)

## 2013-08-15 MED ORDER — SODIUM PHOSPHATE 3 MMOLE/ML IV SOLN
10.0000 mmol | Freq: Once | INTRAVENOUS | Status: AC
  Administered 2013-08-15: 10 mmol via INTRAVENOUS
  Filled 2013-08-15: qty 3.33

## 2013-08-15 MED ORDER — ONDANSETRON HCL 4 MG/2ML IJ SOLN
4.0000 mg | INTRAMUSCULAR | Status: DC | PRN
  Administered 2013-08-15 – 2013-08-19 (×16): 4 mg via INTRAVENOUS
  Filled 2013-08-15 (×16): qty 2

## 2013-08-15 MED ORDER — ENOXAPARIN SODIUM 40 MG/0.4ML ~~LOC~~ SOLN
40.0000 mg | SUBCUTANEOUS | Status: DC
  Administered 2013-08-15 – 2013-08-19 (×5): 40 mg via SUBCUTANEOUS
  Filled 2013-08-15 (×5): qty 0.4

## 2013-08-15 MED ORDER — HYDRALAZINE HCL 20 MG/ML IJ SOLN
10.0000 mg | INTRAMUSCULAR | Status: DC | PRN
  Administered 2013-08-15: 40 mg via INTRAVENOUS
  Administered 2013-08-15: 10 mg via INTRAVENOUS
  Administered 2013-08-16: 15 mg via INTRAVENOUS
  Filled 2013-08-15: qty 1
  Filled 2013-08-15: qty 2
  Filled 2013-08-15: qty 1

## 2013-08-15 MED ORDER — OXYCODONE HCL 5 MG PO TABS
10.0000 mg | ORAL_TABLET | ORAL | Status: DC | PRN
  Administered 2013-08-15 – 2013-08-19 (×16): 10 mg via ORAL
  Filled 2013-08-15 (×16): qty 2

## 2013-08-15 MED ORDER — AMLODIPINE BESYLATE 10 MG PO TABS
10.0000 mg | ORAL_TABLET | Freq: Every day | ORAL | Status: DC
  Administered 2013-08-15 – 2013-08-19 (×5): 10 mg via ORAL
  Filled 2013-08-15 (×5): qty 1

## 2013-08-15 NOTE — Progress Notes (Signed)
[facility] ADULT ICU REPLACEMENT PROTOCOL FOR AM LAB REPLACEMENT ONLY  The patient does not apply for the [facility] Adult ICU Electrolyte Replacment Protocol based on the criteria listed below:   1. Is GFR >/= 40 ml/min? yes  Patient's GFR today is >90 2. Is urine output >/= 0.5 ml/kg/hr for the last 6 hours? no Patient's UOP is   None recorded since 22:00  ml/kg/hr 3. Is BUN < 60 mg/dL? yes  Patient's BUN today is 18 4. Abnormal electrolyte(s): K+3.5 Phos 2.2 5. Ordered repletion with: Phos replaced per protocol 6. If a panic level lab has been reported, has the CCM MD in charge been notified? yes.   Physician:  Gwyndolyn Kaufman [facility] 08/15/2013 6:28 AM

## 2013-08-15 NOTE — Progress Notes (Signed)
PULMONARY / CRITICAL CARE MEDICINE   Name: David [name] MRN: [id number] DOB: 04-Apr-1946    ADMISSION DATE:  08/13/2013 CONSULTATION DATE:  08/14/2013   REFERRING MD :  TRH/EDP PRIMARY SERVICE: PCCM  CHIEF COMPLAINT:  Nausea and vomiting  BRIEF PATIENT DESCRIPTION: 68 y/o male with chronic pain, hypertension and DM2 was admitted to [facility] with hypertensive urgency in the setting of persistent nausea and vomiting due to gastroenteritis.  SIGNIFICANT EVENTS / STUDIES:  3/29 CT abd/pelvis>> possible cystitis, otherwise unremarkable study   LINES / TUBES:   CULTURES: 3/29 influenza pcr >>  ANTIBIOTICS:    SUBJECTIVE:  No new complaints. Nausea improved  VITAL SIGNS: Temp:  [98.1 F (36.7 C)-99.5 F (37.5 C)] 98.7 F (37.1 C) (03/30 1600) Pulse Rate:  [91-107] 100 (03/30 1900) Resp:  [11-31] 16 (03/30 1900) BP: (130-187)/(61-90) 152/62 mmHg (03/30 1900) SpO2:  [93 %-100 %] 96 % (03/30 1900) Weight:  [104.1 kg (229 lb 8 oz)] 104.1 kg (229 lb 8 oz) (03/30 0500) HEMODYNAMICS:   VENTILATOR SETTINGS:   INTAKE / OUTPUT: Intake/Output     03/30 0701 - 03/31 0700   I.V. (mL/kg) 60 (0.6)   IV Piggyback    Total Intake(mL/kg) 60 (0.6)   Urine (mL/kg/hr) 950 (0.7)   Total Output 950   Net -890         PHYSICAL EXAMINATION:  Gen: NAD HEENT: NCAT,  EOMi, PULM: CTA B CV: RRR, S4 noted, no JVD AB: BS+, soft, nontender, no hsm Ext: warm, no edema, no clubbing, no cyanosis Derm: no rash or skin breakdown Neuro: A&Ox4, CN II-XII intact, strength 5/5 in all 4 extremities   LABS: I have reviewed all of today's lab results. Relevant abnormalities are discussed in the A/P section   CXR: NNF  ASSESSMENT / PLAN:  CARDIOVASCULAR A: Hypertensive urgency> no evidence of end organ damage; likely precipitated by inability to take home pain and BP meds Cardiomegaly on CXR, never had echocardiogram P:  Wean nicardipine to off Resume amlodipine PRN hydralazine to maintain  SBP < 170 mmHg Cont labetalol   RENAL A:  Hypokalemia, resolved P:   Monitor BMET intermittently Monitor I/Os Correct electrolytes as indicated   GASTROINTESTINAL A:  Nausea and vomiting most likely due to viral gastroenteritis, normal abdominal exam and CT abdomen P:   Advance diet as tolerated   INFECTIOUS A:  Viral gastroenteritis P:   -f/u flu pcr  ENDOCRINE A:  DM2 P:   -SSI AC/HS  NEUROLOGIC A:  Chronic pain Headache without focal neurologic sign P:   Cont current Rx   TODAY'S SUMMARY:   Merton Border, MD ; [facility] service Mobile [phone].  After 5:30 PM or weekends, call [phone]

## 2013-08-16 LAB — CBC
HEMATOCRIT: 41 % (ref 39.0–52.0)
Hemoglobin: 14.6 g/dL (ref 13.0–17.0)
MCH: 34.8 pg — ABNORMAL HIGH (ref 26.0–34.0)
MCHC: 35.6 g/dL (ref 30.0–36.0)
MCV: 97.9 fL (ref 78.0–100.0)
Platelets: 215 10*3/uL (ref 150–400)
RBC: 4.19 MIL/uL — ABNORMAL LOW (ref 4.22–5.81)
RDW: 12 % (ref 11.5–15.5)
WBC: 11.4 10*3/uL — ABNORMAL HIGH (ref 4.0–10.5)

## 2013-08-16 LAB — GLUCOSE, CAPILLARY
GLUCOSE-CAPILLARY: 122 mg/dL — AB (ref 70–99)
GLUCOSE-CAPILLARY: 176 mg/dL — AB (ref 70–99)
Glucose-Capillary: 109 mg/dL — ABNORMAL HIGH (ref 70–99)
Glucose-Capillary: 152 mg/dL — ABNORMAL HIGH (ref 70–99)

## 2013-08-16 LAB — BASIC METABOLIC PANEL
BUN: 20 mg/dL (ref 6–23)
CO2: 23 mEq/L (ref 19–32)
CREATININE: 0.85 mg/dL (ref 0.50–1.35)
Calcium: 8.8 mg/dL (ref 8.4–10.5)
Chloride: 101 mEq/L (ref 96–112)
GFR calc non Af Amer: 89 mL/min — ABNORMAL LOW (ref >90)
Glucose, Bld: 147 mg/dL — ABNORMAL HIGH (ref 70–99)
POTASSIUM: 3.4 meq/L — AB (ref 3.7–5.3)
Sodium: 139 mEq/L (ref 137–147)

## 2013-08-16 MED ORDER — POTASSIUM CHLORIDE 10 MEQ/100ML IV SOLN
10.0000 meq | INTRAVENOUS | Status: AC
  Administered 2013-08-16 (×4): 10 meq via INTRAVENOUS
  Filled 2013-08-16: qty 100

## 2013-08-16 MED ORDER — METOPROLOL TARTRATE 1 MG/ML IV SOLN: 2.5000 mg | INTRAVENOUS | Status: DC | PRN

## 2013-08-16 MED ORDER — POTASSIUM CHLORIDE IN NACL 20-0.45 MEQ/L-% IV SOLN
INTRAVENOUS | Status: DC
  Administered 2013-08-16 – 2013-08-17 (×2): via INTRAVENOUS
  Filled 2013-08-16 (×4): qty 1000

## 2013-08-16 MED ORDER — LATANOPROST 0.005 % OP SOLN
1.0000 [drp] | Freq: Every day | OPHTHALMIC | Status: DC
  Administered 2013-08-16 – 2013-08-18 (×3): 1 [drp] via OPHTHALMIC
  Filled 2013-08-16: qty 2.5

## 2013-08-16 MED ORDER — LABETALOL HCL 200 MG PO TABS
200.0000 mg | ORAL_TABLET | Freq: Two times a day (BID) | ORAL | Status: DC
  Administered 2013-08-16 – 2013-08-19 (×6): 200 mg via ORAL
  Filled 2013-08-16 (×7): qty 1

## 2013-08-16 MED ORDER — INSULIN DETEMIR 100 UNIT/ML ~~LOC~~ SOLN
20.0000 [IU] | Freq: Every day | SUBCUTANEOUS | Status: DC
  Administered 2013-08-16: 20 [IU] via SUBCUTANEOUS
  Filled 2013-08-16 (×2): qty 0.2

## 2013-08-16 NOTE — Progress Notes (Signed)
Pt found to be sliding sideways out of his bed by environmental services. Pt's head and upper torso hanging off bed, hand on the floor. Pt unable to get himself back into bed - I assisted him. Pt not able to state why he was sliding out of bed. IT sales professional at bedside. Per Dr. Alva Garnet place posey belt. Pt allowed [place] to place belt. Will continue to monitor.  - Soyla Dryer, RN

## 2013-08-16 NOTE — Progress Notes (Signed)
Report given to [facility] RN unit [address].  Desmond Dike RN

## 2013-08-16 NOTE — Progress Notes (Signed)
PULMONARY / CRITICAL CARE MEDICINE   Name: David [name] MRN: [id number] DOB: 08-24-1945    ADMISSION DATE:  08/13/2013 CONSULTATION DATE:  08/14/2013   REFERRING MD :  TRH/EDP PRIMARY SERVICE: PCCM  CHIEF COMPLAINT:  Nausea and vomiting  BRIEF PATIENT DESCRIPTION: 68 y/o male with chronic pain, hypertension and DM2 was admitted to [facility] with hypertensive urgency in the setting of persistent nausea and vomiting due to gastroenteritis.  SIGNIFICANT EVENTS / STUDIES:  3/28 UA negative (0-2 WBC/HPF) 3/29 CT abd/pelvis:  possible cystitis, otherwise unremarkable study  3/30 weaned off nicardipine gtt 3/31 Transferred to med-surg. TRH to assume care as of AM 4/01 and PCCM to sign off  LINES / TUBES:   CULTURES:   ANTIBIOTICS:    SUBJECTIVE:  No new complaints. Nausea persists. NAD  VITAL SIGNS: Temp:  [98 F (36.7 C)-99.6 F (37.6 C)] 98.8 F (37.1 C) (03/31 0334) Pulse Rate:  [88-104] 96 (03/31 0900) Resp:  [15-23] 17 (03/31 0900) BP: (152-181)/(62-89) 172/76 mmHg (03/31 0900) SpO2:  [93 %-98 %] 97 % (03/31 0900) Weight:  [104.1 kg (229 lb 8 oz)] 104.1 kg (229 lb 8 oz) (03/31 0500) HEMODYNAMICS:   VENTILATOR SETTINGS:   INTAKE / OUTPUT: Intake/Output     03/30 0701 - 03/31 0700 03/31 0701 - 04/01 0700   I.V. (mL/kg) 60 (0.6)    IV Piggyback     Total Intake(mL/kg) 60 (0.6)    Urine (mL/kg/hr) 1300 (0.5) 500 (0.6)   Total Output 1300 500   Net -1240 -500        Urine Occurrence 1 x    Stool Occurrence 1 x      PHYSICAL EXAMINATION: Gen: NAD HEENT: NCAT,  EOMi, PULM: CTA B CV: RRR, S4 noted, no JVD AB: BS+, soft, nontender, nondistended Ext: warm, no edema, no clubbing, no cyanosis Derm: no rash or skin breakdown Neuro: A&Ox4, CN II-XII intact, strength 5/5 in all 4 extremities   LABS: I have reviewed all of today's lab results. Relevant abnormalities are discussed in the A/P section   CXR: NNF  ASSESSMENT / PLAN:  CARDIOVASCULAR A:  Hypertensive urgency, resolved  likely precipitated by inability to take home pain and BP meds P:  Cont amlodipine Cont PRN hydralazine to maintain SBP < 170 mmHg Increase labetalol 3/31  RENAL A:  Hypokalemia, recurrent P:   Monitor BMET intermittently Monitor I/Os Correct electrolytes as indicated Cont IVFs until able to meet needs orally  GASTROINTESTINAL A:  Persistent nausea - suspected viral gastroenteritis P:   Advance diet as tolerated   INFECTIOUS A:  Suspected viral gastroenteritis P:   Cont supportive care Monitor clinically  ENDOCRINE A:  DM2 P:   Cont SSI   NEUROLOGIC A:  Chronic pain Headache without focal neurologic sign P:   Cont current Rx   TODAY'S SUMMARY:  Transfer to [employer] and [place] service    Merton Border, MD ; [facility] service Mobile [phone].  After 5:30 PM or weekends, call [phone]

## 2013-08-17 DIAGNOSIS — R5381 Other malaise: Secondary | ICD-10-CM

## 2013-08-17 DIAGNOSIS — R5383 Other fatigue: Secondary | ICD-10-CM

## 2013-08-17 DIAGNOSIS — R110 Nausea: Secondary | ICD-10-CM

## 2013-08-17 LAB — GLUCOSE, CAPILLARY
GLUCOSE-CAPILLARY: 233 mg/dL — AB (ref 70–99)
GLUCOSE-CAPILLARY: 188 mg/dL — AB (ref 70–99)
Glucose-Capillary: 186 mg/dL — ABNORMAL HIGH (ref 70–99)
Glucose-Capillary: 240 mg/dL — ABNORMAL HIGH (ref 70–99)

## 2013-08-17 MED ORDER — INSULIN DETEMIR 100 UNIT/ML ~~LOC~~ SOLN
30.0000 [IU] | Freq: Every day | SUBCUTANEOUS | Status: DC
  Administered 2013-08-17 – 2013-08-18 (×2): 30 [IU] via SUBCUTANEOUS
  Filled 2013-08-17 (×3): qty 0.3

## 2013-08-17 MED ORDER — FUROSEMIDE 10 MG/ML IJ SOLN
40.0000 mg | Freq: Once | INTRAMUSCULAR | Status: AC
  Administered 2013-08-17: 40 mg via INTRAVENOUS
  Filled 2013-08-17: qty 4

## 2013-08-17 MED ORDER — POTASSIUM CHLORIDE CRYS ER 20 MEQ PO TBCR
40.0000 meq | EXTENDED_RELEASE_TABLET | Freq: Four times a day (QID) | ORAL | Status: AC
  Administered 2013-08-17 (×2): 40 meq via ORAL
  Filled 2013-08-17 (×2): qty 2

## 2013-08-17 NOTE — Progress Notes (Signed)
NURSING PROGRESS NOTE  BASHIR KUSNER [id number] Transfer Data: 08/17/2013 6:52 AM Attending Provider: Cherene Altes, MD [id number] Abran Cantor, MD Code Status: full code   STEPHEN RISTINE is a 68 y.o. male patient transferred from  2 MW  -No acute distress noted.  -No complaints of shortness of breath.  -No complaints of chest pain.   Cardiac Monitoring: Box # [id number] in place. Cardiac monitor yields:1 ST DEGREE HB.  Last Documented Vital Signs: Blood pressure 156/85, pulse 83, temperature 98.4 F (36.9 C), temperature source Oral, resp. rate 20, height 6\' 2"  (1.88 m), weight 102.377 kg (225 lb 11.2 oz), SpO2 94.00%.  IV Fluids:  IV in place, occlusive dsg intact without redness, IV cath hand left, condition patent and no redness 1/2 NS W/ 20 KCL  @50  CC/HR}.   Allergies:  Review of patient's allergies indicates no known allergies.  Past Medical History:   has a past medical history of Diabetes mellitus without complication; Hypertension; and Glaucoma.  Past Surgical History:   has past surgical history that includes Joint replacement; Back surgery; Cyst excision; and Total hip arthroplasty.  Social History:   reports that he has never smoked. He does not have any smokeless tobacco history on file. He reports that he does not drink alcohol or use illicit drugs.  Skin: NSI  Patient/Family orientated to room. Information packet given to patient/family. Admission inpatient armband information verified with patient/family to include name and date of birth and placed on patient arm. Side rails up x 2, fall assessment and education completed with patient/family. Patient/family able to verbalize understanding of risk associated with falls and verbalized understanding to call for assistance before getting out of bed. Call light within reach. Patient/family able to voice and demonstrate understanding of unit orientation instructions.    Will continue to evaluate and treat per MD  orders.

## 2013-08-17 NOTE — Progress Notes (Signed)
[facility] PROGRESS NOTE   David [name] [id number] DOB: 1946-03-30 DOA: 08/13/2013 PCP: David Brazil, MD  HPI/Subjective: Patient seen with wife at bedside. Still has nausea but no vomiting.  Assessment/Plan: Principal Problem:   Acute gastroenteritis Active Problems:   Malaise   Intractable nausea and vomiting   Hypertension   Chronic back pain   Glaucoma   Headache   Hypertensive urgency    Acute viral gastroenteritis -Patient came in to the hospital with intractable nausea/vomiting and abdominal pain. -This is likely to be secondary to acute viral gastroenteritis. -Rest of workup was negative including negative UA, chest x-ray. -CT abdomen/pelvis showed no acute events, possible cystitis but UA is negative. -Treated symptomatically with antiemetics and IV fluids. Improving, diet advanced to carb modified diet.  Hypertensive urgency -Likely from not taking/or vomiting medications secondary to nausea and vomiting. -Blood pressure went up to 206/96, patient has to be on narcotic pain drip. -Restarted on home oral medications, blood pressure improving.  Diabetes mellitus type 2 -Hemoglobin A1c of 7.7 which correlate with mean plasma glucose of 174. -Home medications continued. -No previous history of diabetic gastroparesis.  Chronic back pain -Narcotic home medications continued throughout the hospital stay.  Intractable nausea and vomiting -As mentioned above which likely secondary to acute viral gastroenteritis which is resolved. -Other comorbidities which probably exacerbated the situation or diabetes and chronic narcotics.  Code Status: Full code Family Communication: Plan discussed with the patient. Disposition Plan: Remains inpatient   Consultants:  Patient was under CCM service till yesterday, transferred to [facility] on 08/17/2013  Procedures:  None  Antibiotics:   none   Objective: Filed Vitals:   08/17/13 1429  BP: 135/75  Pulse: 75  Temp: 98.2 F (36.8 C)  Resp: 18    Intake/Output Summary (Last 24 hours) at 08/17/13 1501 Last data filed at 08/17/13 0719  Gross per 24 hour  Intake 761.67 ml  Output   1100 ml  Net -338.33 ml   Filed Weights   08/15/13 0500 08/16/13 0500 08/17/13 0438  Weight: 104.1 kg (229 lb 8 oz) 104.1 kg (229 lb 8 oz) 102.377 kg (225 lb 11.2 oz)    Exam: General: Alert and awake, oriented x3, not in any acute distress. HEENT: anicteric sclera, pupils reactive to light and accommodation, EOMI CVS: S1-S2 clear, no murmur rubs or gallops Chest: clear to auscultation bilaterally, no wheezing, rales or rhonchi Abdomen: soft nontender, nondistended, normal bowel sounds, no organomegaly Extremities: no cyanosis, clubbing or edema noted bilaterally Neuro: Cranial nerves II-XII intact, no focal neurological deficits  Data Reviewed: Basic Metabolic Panel:  Recent Labs Lab 08/13/13 1911 08/14/13 0221 08/14/13 1115 08/14/13 1510 08/15/13 0325 08/16/13 0526  NA 138 136*  --  139 138 139  K 3.6* 3.5*  --  3.4* 3.5* 3.4*  CL 94* 95*  --  99 99 101  CO2 26 24  --  23 24 23   GLUCOSE 201* 182*  --  246* 210* 147*  BUN 9 9  --  16 18 20   CREATININE 0.69 0.66 0.71 0.75 0.72 0.85  CALCIUM 10.0 9.3  --  9.1 9.2 8.8  MG  --   --   --  1.9 2.1  --   PHOS  --   --   --  3.0 2.2*  --    Liver Function Tests:  Recent Labs Lab 08/13/13 1911  AST 27  ALT 31  ALKPHOS 73  BILITOT 1.0  PROT 8.6*  ALBUMIN 4.4    Recent Labs Lab 08/13/13 1911  LIPASE 12   No results found for this basename: AMMONIA,  in the last 168 hours CBC:  Recent Labs Lab 08/13/13 1911 08/14/13 1115 08/15/13 0325 08/16/13 0526  WBC 10.3 12.6* 11.6* 11.4*  NEUTROABS 8.6*  --   --   --   HGB 15.5 15.6 15.0 14.6  HCT 42.6 42.7 41.5 41.0  MCV 97.0 95.7 96.5 97.9  PLT 221 230 228 215   Cardiac Enzymes: No results found for this basename: CKTOTAL, CKMB, CKMBINDEX,  TROPONINI,  in the last 168 hours BNP (last 3 results) No results found for this basename: PROBNP,  in the last 8760 hours CBG:  Recent Labs Lab 08/16/13 1222 08/16/13 1728 08/16/13 2220 08/17/13 0737 08/17/13 1207  GLUCAP 109* 152* 176* 186* 233*    Micro Recent Results (from the past 240 hour(s))  MRSA PCR SCREENING     Status: None   Collection Time    08/14/13 10:39 AM      Result Value Ref Range Status   MRSA by PCR NEGATIVE  NEGATIVE Final   Comment:            The GeneXpert MRSA Assay (FDA     approved for NASAL specimens     only), is one component of a     comprehensive MRSA colonization     surveillance program. It is not     intended to diagnose MRSA     infection nor to guide or     monitor treatment for     MRSA infections.     Studies: No results found.  Scheduled Meds: . amLODipine  10 mg Oral Daily  . enoxaparin (LOVENOX) injection  40 mg Subcutaneous Q24H  . insulin aspart  0-15 Units Subcutaneous TID WC  . insulin detemir  20 Units Subcutaneous QHS  . labetalol  200 mg Oral BID  . latanoprost  1 drop Both Eyes QHS   Continuous Infusions: . 0.45 % NaCl with KCl 20 mEq / L 50 mL/hr at 08/17/13 0650       Time spent: 35 minutes    [facility] A  [facility] Pager [phone] If 7PM-7AM, please contact night-coverage at [email or web address], password [facility] 08/17/2013, 3:01 PM  LOS: 4 days

## 2013-08-18 DIAGNOSIS — H409 Unspecified glaucoma: Secondary | ICD-10-CM

## 2013-08-18 LAB — GLUCOSE, CAPILLARY
GLUCOSE-CAPILLARY: 264 mg/dL — AB (ref 70–99)
GLUCOSE-CAPILLARY: 245 mg/dL — AB (ref 70–99)
Glucose-Capillary: 176 mg/dL — ABNORMAL HIGH (ref 70–99)
Glucose-Capillary: 254 mg/dL — ABNORMAL HIGH (ref 70–99)

## 2013-08-18 LAB — URINALYSIS, ROUTINE W REFLEX MICROSCOPIC
Bilirubin Urine: NEGATIVE
GLUCOSE, UA: 100 mg/dL — AB
Hgb urine dipstick: NEGATIVE
Ketones, ur: NEGATIVE mg/dL
Leukocytes, UA: NEGATIVE
Nitrite: NEGATIVE
PH: 5 (ref 5.0–8.0)
Protein, ur: NEGATIVE mg/dL
SPECIFIC GRAVITY, URINE: 1.011 (ref 1.005–1.030)
Urobilinogen, UA: 0.2 mg/dL (ref 0.0–1.0)

## 2013-08-18 LAB — BASIC METABOLIC PANEL
BUN: 16 mg/dL (ref 6–23)
CO2: 26 mEq/L (ref 19–32)
Calcium: 9.3 mg/dL (ref 8.4–10.5)
Chloride: 100 mEq/L (ref 96–112)
Creatinine, Ser: 0.83 mg/dL (ref 0.50–1.35)
GFR calc non Af Amer: 90 mL/min — ABNORMAL LOW (ref >90)
GLUCOSE: 217 mg/dL — AB (ref 70–99)
Potassium: 4.4 mEq/L (ref 3.7–5.3)
SODIUM: 137 meq/L (ref 137–147)

## 2013-08-18 MED ORDER — PANTOPRAZOLE SODIUM 40 MG PO TBEC
40.0000 mg | DELAYED_RELEASE_TABLET | Freq: Two times a day (BID) | ORAL | Status: DC
  Administered 2013-08-18 – 2013-08-19 (×3): 40 mg via ORAL
  Filled 2013-08-18 (×3): qty 1

## 2013-08-18 MED ORDER — POLYETHYLENE GLYCOL 3350 17 G PO PACK
17.0000 g | PACK | Freq: Every day | ORAL | Status: DC
  Administered 2013-08-18 – 2013-08-19 (×2): 17 g via ORAL
  Filled 2013-08-18 (×2): qty 1

## 2013-08-18 MED ORDER — MAGNESIUM HYDROXIDE 400 MG/5ML PO SUSP
30.0000 mL | Freq: Once | ORAL | Status: AC
  Administered 2013-08-18: 30 mL via ORAL
  Filled 2013-08-18: qty 30

## 2013-08-18 MED ORDER — METOCLOPRAMIDE HCL 5 MG/ML IJ SOLN
5.0000 mg | Freq: Three times a day (TID) | INTRAMUSCULAR | Status: DC | PRN
  Filled 2013-08-18: qty 1

## 2013-08-18 MED ORDER — FUROSEMIDE 10 MG/ML IJ SOLN
40.0000 mg | Freq: Once | INTRAMUSCULAR | Status: AC
  Administered 2013-08-18: 40 mg via INTRAVENOUS
  Filled 2013-08-18: qty 4

## 2013-08-18 NOTE — Progress Notes (Signed)
[facility] PROGRESS NOTE   David [name] [id number] DOB: [birth year] DOA: 08/13/2013 PCP: Leola Brazil, MD  HPI/Subjective: Patient seen with wife at bedside. He was eating this morning still complaining about epigastric abdominal pain. Started on Protonix orally, asking if he can be seen by gastroenterologist.  Assessment/Plan: Principal Problem:   Acute gastroenteritis Active Problems:   Malaise   Intractable nausea and vomiting   Hypertension   Chronic back pain   Glaucoma   Headache   Hypertensive urgency    Acute viral gastroenteritis -Patient came in to the hospital with intractable nausea/vomiting and abdominal pain. -This is likely to be secondary to acute viral gastroenteritis. -Rest of workup was negative including negative UA, chest x-ray. -CT abdomen/pelvis showed no acute events, possible cystitis but UA is negative. -Treated symptomatically with antiemetics and IV fluids. Improving, diet advanced to carb modified diet. -Patient is tolerating diet, diet advanced, he might see GI as outpatient.  Hypertensive urgency -Likely from not taking/or vomiting medications secondary to nausea and vomiting. -Blood pressure went up to 206/96, patient has to be on narcotic pain drip. -Restarted on home oral medications, blood pressure improving.  Diabetes mellitus type 2 -Hemoglobin A1c of 7.7 which correlate with mean plasma glucose of 174. -Home medications continued. -No previous history of diabetic gastroparesis.  Chronic back pain -Narcotic home medications continued throughout the hospital stay.  Intractable nausea and vomiting -As mentioned above which likely secondary to acute viral gastroenteritis which is resolved. -Other comorbidities which probably exacerbated the situation or diabetes and chronic narcotics.  Code Status: Full code Family Communication: Plan discussed with the patient. Disposition Plan: Remains  inpatient   Consultants:  Patient was under CCM service till yesterday, transferred to [facility] on 08/17/2013  Procedures:  None  Antibiotics:   none   Objective: Filed Vitals:   08/18/13 1033  BP: 150/80  Pulse: 83  Temp:   Resp:     Intake/Output Summary (Last 24 hours) at 08/18/13 1405 Last data filed at 08/18/13 0700  Gross per 24 hour  Intake    200 ml  Output   2025 ml  Net  -1825 ml   Filed Weights   08/15/13 0500 08/16/13 0500 08/17/13 0438  Weight: 104.1 kg (229 lb 8 oz) 104.1 kg (229 lb 8 oz) 102.377 kg (225 lb 11.2 oz)    Exam: General: Alert and awake, oriented x3, not in any acute distress. HEENT: anicteric sclera, pupils reactive to light and accommodation, EOMI CVS: S1-S2 clear, no murmur rubs or gallops Chest: clear to auscultation bilaterally, no wheezing, rales or rhonchi Abdomen: soft nontender, nondistended, normal bowel sounds, no organomegaly Extremities: no cyanosis, clubbing or edema noted bilaterally Neuro: Cranial nerves II-XII intact, no focal neurological deficits  Data Reviewed: Basic Metabolic Panel:  Recent Labs Lab 08/14/13 0221 08/14/13 1115 08/14/13 1510 08/15/13 0325 08/16/13 0526 08/18/13 0930  NA 136*  --  139 138 139 137  K 3.5*  --  3.4* 3.5* 3.4* 4.4  CL 95*  --  99 99 101 100  CO2 24  --  23 24 23 26   GLUCOSE 182*  --  246* 210* 147* 217*  BUN 9  --  16 18 20 16   CREATININE 0.66 0.71 0.75 0.72 0.85 0.83  CALCIUM 9.3  --  9.1 9.2 8.8 9.3  MG  --   --  1.9 2.1  --   --   PHOS  --   --  3.0 2.2*  --   --  Liver Function Tests:  Recent Labs Lab 08/13/13 1911  AST 27  ALT 31  ALKPHOS 73  BILITOT 1.0  PROT 8.6*  ALBUMIN 4.4    Recent Labs Lab 08/13/13 1911  LIPASE 12   No results found for this basename: AMMONIA,  in the last 168 hours CBC:  Recent Labs Lab 08/13/13 1911 08/14/13 1115 08/15/13 0325 08/16/13 0526  WBC 10.3 12.6* 11.6* 11.4*  NEUTROABS 8.6*  --   --    --   HGB 15.5 15.6 15.0 14.6  HCT 42.6 42.7 41.5 41.0  MCV 97.0 95.7 96.5 97.9  PLT 221 230 228 215   Cardiac Enzymes: No results found for this basename: CKTOTAL, CKMB, CKMBINDEX, TROPONINI,  in the last 168 hours BNP (last 3 results) No results found for this basename: PROBNP,  in the last 8760 hours CBG:  Recent Labs Lab 08/17/13 1207 08/17/13 1702 08/17/13 2150 08/18/13 0737 08/18/13 1205  GLUCAP 233* 240* 188* 176* 254*    Micro Recent Results (from the past 240 hour(s))  MRSA PCR SCREENING     Status: None   Collection Time    08/14/13 10:39 AM      Result Value Ref Range Status   MRSA by PCR NEGATIVE  NEGATIVE Final   Comment:            The GeneXpert MRSA Assay (FDA     approved for NASAL specimens     only), is one component of a     comprehensive MRSA colonization     surveillance program. It is not     intended to diagnose MRSA     infection nor to guide or     monitor treatment for     MRSA infections.     Studies: No results found.  Scheduled Meds: . amLODipine  10 mg Oral Daily  . enoxaparin (LOVENOX) injection  40 mg Subcutaneous Q24H  . insulin aspart  0-15 Units Subcutaneous TID WC  . insulin detemir  30 Units Subcutaneous QHS  . labetalol  200 mg Oral BID  . latanoprost  1 drop Both Eyes QHS  . polyethylene glycol  17 g Oral Daily   Continuous Infusions:       Time spent: 35 minutes    [facility] A  [facility] Pager [phone] If 7PM-7AM, please contact night-coverage at [email or web address], password [facility] 08/18/2013, 2:05 PM  LOS: 5 days

## 2013-08-19 LAB — GLUCOSE, CAPILLARY
GLUCOSE-CAPILLARY: 194 mg/dL — AB (ref 70–99)
GLUCOSE-CAPILLARY: 249 mg/dL — AB (ref 70–99)

## 2013-08-19 MED ORDER — SODIUM CHLORIDE 0.9 % IV SOLN: INTRAVENOUS | Status: DC | PRN

## 2013-08-19 MED ORDER — PANTOPRAZOLE SODIUM 40 MG PO TBEC: 40.0000 mg | DELAYED_RELEASE_TABLET | Freq: Every day | ORAL | Status: DC

## 2013-08-19 NOTE — Discharge Summary (Signed)
Physician Discharge Summary  David [name] [id number] DOB: [birth year] DOA: 08/13/2013  PCP: Leola Brazil, MD  Admit date: 08/13/2013 Discharge date: 08/19/2013  Time spent: 40 minutes  Recommendations for Outpatient Follow-up:  1. Followup with primary care physician within one week.  Discharge Diagnoses:  Principal Problem:   Acute gastroenteritis Active Problems:   Malaise   Intractable nausea and vomiting   Hypertension   Chronic back pain   Glaucoma   Headache   Hypertensive urgency   Discharge Condition: Stable  Diet recommendation: Carb modified diet  Filed Weights   08/17/13 0438 08/18/13 1844 08/19/13 0554  Weight: 102.377 kg (225 lb 11.2 oz) 99.519 kg (219 lb 6.4 oz) 99.5 kg (219 lb 5.7 oz)    History of present illness:  David [name] is a 68 y.o. male with a history of HTN, DM2, and Glaucoma who presents to the ED with complaints Of Intractable N+V since 1 pm yesterday. He has had fevers and chills and headache. He denies having any hematemesis. He reports epigastric area ABD Pain from his increased vomiting. He reports that he was exposed to a student on Friday with similar symptoms  Hospital Course:   Acute viral gastroenteritis  -Patient came in to the hospital with intractable nausea/vomiting and abdominal pain.  -This is likely to be secondary to acute viral gastroenteritis, patient had low-grade fever. -Rest of workup was negative including negative UA, chest x-ray.  -CT abdomen/pelvis showed no acute events, possible cystitis but repeat UA is negative.  -Treated symptomatically with antiemetics and IV fluids. Improving, diet advanced to carb modified diet.  -Patient is tolerating diet, diet advanced. -Recommending to see GI as outpatient for screening colonoscopy and possible EGD.  Hypertensive urgency  -Likely from not taking/or vomiting medications secondary to nausea and vomiting.  -Blood pressure went up to 206/96, patient has to  be on narcotic pain drip.  -Restarted on home oral medications, blood pressure improving.   Diabetes mellitus type 2  -Hemoglobin A1c of 7.7 which correlate with mean plasma glucose of 174.  -Home medications continued.  -No previous history of diabetic gastroparesis.   Chronic back pain  -Narcotic home medications continued throughout the hospital stay.   Intractable nausea and vomiting  -As mentioned above which likely secondary to acute viral gastroenteritis which is resolved.  -Other comorbidities which probably exacerbated the situation or diabetes and chronic narcotics.   Procedures:  None  Consultations:  Patient was under CCM service till yesterday, transferred to [facility] on 08/17/2013   Discharge Exam: Filed Vitals:   08/19/13 1125  BP: 120/70  Pulse: 80  Temp:   Resp:    General: Alert and awake, oriented x3, not in any acute distress. HEENT: anicteric sclera, pupils reactive to light and accommodation, EOMI CVS: S1-S2 clear, no murmur rubs or gallops Chest: clear to auscultation bilaterally, no wheezing, rales or rhonchi Abdomen: soft nontender, nondistended, normal bowel sounds, no organomegaly Extremities: no cyanosis, clubbing or edema noted bilaterally Neuro: Cranial nerves II-XII intact, no focal neurological deficits   Discharge Orders   Future Orders Complete By Expires   Diet Carb Modified  As directed    Increase activity slowly  As directed        Medication List    STOP taking these medications       amlodipine-atorvastatin 10-20 MG per tablet  Commonly known as:  CADUET      TAKE these medications       ALPRAZolam 1  MG tablet  Commonly known as:  XANAX  Take 1 mg by mouth daily as needed for anxiety.     aspirin EC 81 MG tablet  Take 81 mg by mouth daily.     bimatoprost 0.03 % ophthalmic solution  Commonly known as:  LUMIGAN  Place 1 drop into both eyes at bedtime.     carvedilol 12.5 MG tablet  Commonly  known as:  COREG  Take 12.5 mg by mouth 2 (two) times daily with a meal.     CENTRUM SILVER ADULT 50+ Tabs  Take 1 tablet by mouth daily.     EYE DROPS OP  Apply 2 drops to eye 3 (three) times daily as needed (for dry eyes).     FARXIGA 10 MG Tabs  Generic drug:  Dapagliflozin Propanediol  Take 10 mg by mouth daily.     Fish Oil 1000 MG Caps  Take 1,000 mg by mouth daily.     folic acid A999333 MCG tablet  Commonly known as:  FOLVITE  Take 400 mcg by mouth daily.     Hydrocodone-Acetaminophen 7.5-300 MG Tabs  Take 1 tablet by mouth 2 (two) times daily as needed (for pain).     LEVEMIR FLEXTOUCH 100 UNIT/ML Pen  Generic drug:  Insulin Detemir  Inject 40 Units into the skin at bedtime.     LYRICA 100 MG capsule  Generic drug:  pregabalin  Take 100 mg by mouth 4 (four) times daily.     methocarbamol 750 MG tablet  Commonly known as:  ROBAXIN  Take 750 mg by mouth 2 (two) times daily as needed for muscle spasms.     NON FORMULARY  Inject 1 each as directed as needed ("cortisone injection").     NOVOLOG FLEXPEN 100 UNIT/ML FlexPen  Generic drug:  insulin aspart  Inject 10 Units into the skin 3 (three) times daily with meals.     Oxycodone HCl 20 MG Tabs  Take 20 mg by mouth 2 (two) times daily.     pantoprazole 40 MG tablet  Commonly known as:  PROTONIX  Take 1 tablet (40 mg total) by mouth daily.     TRIBENZOR 40-10-25 MG Tabs  Generic drug:  Olmesartan-Amlodipine-HCTZ  Take 1 tablet by mouth daily.     vitamin C 1000 MG tablet  Take 1,000 mg by mouth daily.     Vitamin D-3 1000 UNITS Caps  Take 1,000 Units by mouth daily.     vitamin E 400 UNIT capsule  Take 400 Units by mouth daily.     zinc sulfate 220 MG capsule  Take 220 mg by mouth daily.       No Known Allergies     Follow-up Information   Follow up with KILPATRICK JR,GEORGE R, MD In 1 week.   Specialty:  Pulmonary Disease   Contact information:   [place]  [id number] [phone]        The results of significant diagnostics from this hospitalization (including imaging, microbiology, ancillary and laboratory) are listed below for reference.    Significant Diagnostic Studies: Dg Chest 2 View  08/13/2013   CLINICAL DATA:  Fever.  Chills.  EXAM: CHEST  2 VIEW  COMPARISON:  05/07/2005.  FINDINGS: Poor inspiration. Stable enlarged cardiac silhouette. Clear lungs. Unremarkable bones.  IMPRESSION: No acute abnormality.  Stable cardiomegaly.   Electronically Signed   By: Enrique Sack M.D.   On: 08/13/2013 20:21   Ct Abdomen Pelvis W Contrast  08/14/2013   CLINICAL DATA:  Abdominal pain and emesis  EXAM: CT ABDOMEN AND PELVIS WITH CONTRAST  TECHNIQUE: Multidetector CT imaging of the abdomen and pelvis was performed using the standard protocol following bolus administration of intravenous contrast.  CONTRAST:  150mL OMNIPAQUE IOHEXOL 300 MG/ML  SOLN  COMPARISON:  06/28/2012  FINDINGS: BODY WALL: Unremarkable.  LOWER CHEST: Mild, patchy air trapping in the lower lungs. Diffuse coronary atherosclerosis. Borderline cardiomegaly.  ABDOMEN/PELVIS:  Liver: No focal abnormality.  Biliary: No evidence of biliary obstruction or stone.  Pancreas: Diffuse atrophy.  No ductal dilatation.  Spleen: Unremarkable.  Adrenals: Unremarkable.  Kidneys and ureters: No hydronephrosis or stone.  Bladder: Mild haziness of the pericystic fat with questionable circumferential wall thickening.  Reproductive: Unremarkable.  Bowel: No obstruction. Normal appendix.  Few colonic diverticula.  Retroperitoneum: No mass or adenopathy.  Peritoneum: No free fluid or gas.  Vascular: No acute abnormality.  OSSEOUS: Advanced lower lumbar degenerative disc and facet disease, with canal and foraminal stenosis most notable at L4-5 and L5-S1. Total right hip arthroplasty. No adverse features.  IMPRESSION: 1. Possible cystitis.  Correlate with urinalysis. 2. Otherwise, no evidence of acute intra-abdominal  disease.   Electronically Signed   By: Jorje Guild M.D.   On: 08/14/2013 01:44    Microbiology: Recent Results (from the past 240 hour(s))  MRSA PCR SCREENING     Status: None   Collection Time    08/14/13 10:39 AM      Result Value Ref Range Status   MRSA by PCR NEGATIVE  NEGATIVE Final   Comment:            The GeneXpert MRSA Assay (FDA     approved for NASAL specimens     only), is one component of a     comprehensive MRSA colonization     surveillance program. It is not     intended to diagnose MRSA     infection nor to guide or     monitor treatment for     MRSA infections.     Labs: Basic Metabolic Panel:  Recent Labs Lab 08/14/13 0221 08/14/13 1115 08/14/13 1510 08/15/13 0325 08/16/13 0526 08/18/13 0930  NA 136*  --  139 138 139 137  K 3.5*  --  3.4* 3.5* 3.4* 4.4  CL 95*  --  99 99 101 100  CO2 24  --  23 24 23 26   GLUCOSE 182*  --  246* 210* 147* 217*  BUN 9  --  16 18 20 16   CREATININE 0.66 0.71 0.75 0.72 0.85 0.83  CALCIUM 9.3  --  9.1 9.2 8.8 9.3  MG  --   --  1.9 2.1  --   --   PHOS  --   --  3.0 2.2*  --   --    Liver Function Tests:  Recent Labs Lab 08/13/13 1911  AST 27  ALT 31  ALKPHOS 73  BILITOT 1.0  PROT 8.6*  ALBUMIN 4.4    Recent Labs Lab 08/13/13 1911  LIPASE 12   No results found for this basename: AMMONIA,  in the last 168 hours CBC:  Recent Labs Lab 08/13/13 1911 08/14/13 1115 08/15/13 0325 08/16/13 0526  WBC 10.3 12.6* 11.6* 11.4*  NEUTROABS 8.6*  --   --   --   HGB 15.5 15.6 15.0 14.6  HCT 42.6 42.7 41.5 41.0  MCV 97.0 95.7 96.5 97.9  PLT 221 230 228 215   Cardiac Enzymes:  No results found for this basename: CKTOTAL, CKMB, CKMBINDEX, TROPONINI,  in the last 168 hours BNP: BNP (last 3 results) No results found for this basename: PROBNP,  in the last 8760 hours CBG:  Recent Labs Lab 08/18/13 1205 08/18/13 1705 08/18/13 2048 08/19/13 0746 08/19/13 1159  GLUCAP 254* 264* 245* 194* 249*        Signed:  Karielle Davidow A  [facility] 08/19/2013, 12:56 PM

## 2013-08-19 NOTE — Progress Notes (Signed)
Nsg Discharge Note  Admit Date:  08/13/2013 Discharge date: 08/19/2013   MAXXWELL BE to be D/C'd Home per MD order.  AVS completed.  Copy for chart, and copy for patient signed, and dated. Patient/caregiver able to verbalize understanding.  Discharge Medication:   Medication List    STOP taking these medications       amlodipine-atorvastatin 10-20 MG per tablet  Commonly known as:  CADUET      TAKE these medications       ALPRAZolam 1 MG tablet  Commonly known as:  XANAX  Take 1 mg by mouth daily as needed for anxiety.     aspirin EC 81 MG tablet  Take 81 mg by mouth daily.     bimatoprost 0.03 % ophthalmic solution  Commonly known as:  LUMIGAN  Place 1 drop into both eyes at bedtime.     carvedilol 12.5 MG tablet  Commonly known as:  COREG  Take 12.5 mg by mouth 2 (two) times daily with a meal.     CENTRUM SILVER ADULT 50+ Tabs  Take 1 tablet by mouth daily.     EYE DROPS OP  Apply 2 drops to eye 3 (three) times daily as needed (for dry eyes).     FARXIGA 10 MG Tabs  Generic drug:  Dapagliflozin Propanediol  Take 10 mg by mouth daily.     Fish Oil 1000 MG Caps  Take 1,000 mg by mouth daily.     folic acid A999333 MCG tablet  Commonly known as:  FOLVITE  Take 400 mcg by mouth daily.     Hydrocodone-Acetaminophen 7.5-300 MG Tabs  Take 1 tablet by mouth 2 (two) times daily as needed (for pain).     LEVEMIR FLEXTOUCH 100 UNIT/ML Pen  Generic drug:  Insulin Detemir  Inject 40 Units into the skin at bedtime.     LYRICA 100 MG capsule  Generic drug:  pregabalin  Take 100 mg by mouth 4 (four) times daily.     methocarbamol 750 MG tablet  Commonly known as:  ROBAXIN  Take 750 mg by mouth 2 (two) times daily as needed for muscle spasms.     NON FORMULARY  Inject 1 each as directed as needed ("cortisone injection").     NOVOLOG FLEXPEN 100 UNIT/ML FlexPen  Generic drug:  insulin aspart  Inject 10 Units into the skin 3 (three) times daily with meals.     Oxycodone HCl 20 MG Tabs  Take 20 mg by mouth 2 (two) times daily.     pantoprazole 40 MG tablet  Commonly known as:  PROTONIX  Take 1 tablet (40 mg total) by mouth daily.     TRIBENZOR 40-10-25 MG Tabs  Generic drug:  Olmesartan-Amlodipine-HCTZ  Take 1 tablet by mouth daily.     vitamin C 1000 MG tablet  Take 1,000 mg by mouth daily.     Vitamin D-3 1000 UNITS Caps  Take 1,000 Units by mouth daily.     vitamin E 400 UNIT capsule  Take 400 Units by mouth daily.     zinc sulfate 220 MG capsule  Take 220 mg by mouth daily.        Discharge Assessment: Filed Vitals:   08/19/13 1428  BP: 153/78  Pulse: 68  Temp: 98.8 F (37.1 C)  Resp: 16   Skin clean, dry and intact without evidence of skin break down, no evidence of skin tears noted. IV catheter discontinued intact. Site without signs and symptoms of  complications - no redness or edema noted at insertion site, patient denies c/o pain - only slight tenderness at site.  Dressing with slight pressure applied.  D/c Instructions-Education: Discharge instructions given to patient/family with verbalized understanding. D/c education completed with patient/family including follow up instructions, medication list, d/c activities limitations if indicated, with other d/c instructions as indicated by MD - patient able to verbalize understanding, all questions fully answered. Patient instructed to return to ED, call 911, or call MD for any changes in condition.  Patient escorted via [place], and D/C home via private auto.  Dayle Points, RN 08/19/2013 3:36 PM

## 2013-09-13 ENCOUNTER — Encounter (HOSPITAL_COMMUNITY): Payer: Self-pay | Admitting: Emergency Medicine

## 2013-09-13 ENCOUNTER — Emergency Department (HOSPITAL_COMMUNITY): Payer: BC Managed Care – PPO

## 2013-09-13 ENCOUNTER — Inpatient Hospital Stay (HOSPITAL_COMMUNITY)
Admission: EM | Admit: 2013-09-13 | Discharge: 2013-09-17 | DRG: 690 | Disposition: A | Discharge status: Home / Self Care | Payer: BC Managed Care – PPO | Attending: Internal Medicine | Admitting: Internal Medicine

## 2013-09-13 DIAGNOSIS — K2970 Gastritis, unspecified, without bleeding: Secondary | ICD-10-CM

## 2013-09-13 DIAGNOSIS — M549 Dorsalgia, unspecified: Secondary | ICD-10-CM | POA: Diagnosis present

## 2013-09-13 DIAGNOSIS — G8929 Other chronic pain: Secondary | ICD-10-CM | POA: Diagnosis present

## 2013-09-13 DIAGNOSIS — H409 Unspecified glaucoma: Secondary | ICD-10-CM | POA: Diagnosis present

## 2013-09-13 DIAGNOSIS — R5381 Other malaise: Secondary | ICD-10-CM

## 2013-09-13 DIAGNOSIS — Z96649 Presence of unspecified artificial hip joint: Secondary | ICD-10-CM

## 2013-09-13 DIAGNOSIS — R29898 Other symptoms and signs involving the musculoskeletal system: Secondary | ICD-10-CM | POA: Diagnosis present

## 2013-09-13 DIAGNOSIS — K5900 Constipation, unspecified: Secondary | ICD-10-CM | POA: Diagnosis present

## 2013-09-13 DIAGNOSIS — Z7982 Long term (current) use of aspirin: Secondary | ICD-10-CM

## 2013-09-13 DIAGNOSIS — Z794 Long term (current) use of insulin: Secondary | ICD-10-CM

## 2013-09-13 DIAGNOSIS — G629 Polyneuropathy, unspecified: Secondary | ICD-10-CM | POA: Diagnosis present

## 2013-09-13 DIAGNOSIS — G608 Other hereditary and idiopathic neuropathies: Secondary | ICD-10-CM | POA: Diagnosis present

## 2013-09-13 DIAGNOSIS — R531 Weakness: Secondary | ICD-10-CM | POA: Diagnosis present

## 2013-09-13 DIAGNOSIS — N179 Acute kidney failure, unspecified: Secondary | ICD-10-CM | POA: Diagnosis present

## 2013-09-13 DIAGNOSIS — I959 Hypotension, unspecified: Secondary | ICD-10-CM | POA: Diagnosis present

## 2013-09-13 DIAGNOSIS — Z79899 Other long term (current) drug therapy: Secondary | ICD-10-CM

## 2013-09-13 DIAGNOSIS — E119 Type 2 diabetes mellitus without complications: Secondary | ICD-10-CM | POA: Diagnosis present

## 2013-09-13 DIAGNOSIS — I10 Essential (primary) hypertension: Secondary | ICD-10-CM | POA: Diagnosis present

## 2013-09-13 DIAGNOSIS — N3090 Cystitis, unspecified without hematuria: Principal | ICD-10-CM | POA: Diagnosis present

## 2013-09-13 DIAGNOSIS — G609 Hereditary and idiopathic neuropathy, unspecified: Secondary | ICD-10-CM | POA: Diagnosis present

## 2013-09-13 DIAGNOSIS — R109 Unspecified abdominal pain: Secondary | ICD-10-CM | POA: Diagnosis present

## 2013-09-13 LAB — BASIC METABOLIC PANEL
BUN: 19 mg/dL (ref 6–23)
CHLORIDE: 95 meq/L — AB (ref 96–112)
CO2: 27 mEq/L (ref 19–32)
Calcium: 9.9 mg/dL (ref 8.4–10.5)
Creatinine, Ser: 0.89 mg/dL (ref 0.50–1.35)
GFR calc non Af Amer: 87 mL/min — ABNORMAL LOW (ref >90)
Glucose, Bld: 230 mg/dL — ABNORMAL HIGH (ref 70–99)
Potassium: 4 mEq/L (ref 3.7–5.3)
SODIUM: 137 meq/L (ref 137–147)

## 2013-09-13 LAB — I-STAT TROPONIN, ED
TROPONIN I, POC: 0 ng/mL (ref 0.00–0.08)
TROPONIN I, POC: 0.01 ng/mL (ref 0.00–0.08)

## 2013-09-13 LAB — CBC
HCT: 44.8 % (ref 39.0–52.0)
Hemoglobin: 16 g/dL (ref 13.0–17.0)
MCH: 35.2 pg — ABNORMAL HIGH (ref 26.0–34.0)
MCHC: 35.7 g/dL (ref 30.0–36.0)
MCV: 98.7 fL (ref 78.0–100.0)
Platelets: 273 10*3/uL (ref 150–400)
RBC: 4.54 MIL/uL (ref 4.22–5.81)
RDW: 12.2 % (ref 11.5–15.5)
WBC: 6.8 10*3/uL (ref 4.0–10.5)

## 2013-09-13 LAB — I-STAT CG4 LACTIC ACID, ED: Lactic Acid, Venous: 1.66 mmol/L (ref 0.5–2.2)

## 2013-09-13 MED ORDER — ONDANSETRON 4 MG PO TBDP
4.0000 mg | ORAL_TABLET | Freq: Once | ORAL | Status: AC
  Administered 2013-09-13: 4 mg via ORAL
  Filled 2013-09-13: qty 1

## 2013-09-13 MED ORDER — HYDROCODONE-ACETAMINOPHEN 7.5-325 MG PO TABS
1.0000 | ORAL_TABLET | Freq: Four times a day (QID) | ORAL | Status: DC | PRN
  Filled 2013-09-13: qty 1

## 2013-09-13 MED ORDER — LABETALOL HCL 5 MG/ML IV SOLN
10.0000 mg | Freq: Once | INTRAVENOUS | Status: AC
  Administered 2013-09-13: 10 mg via INTRAVENOUS
  Filled 2013-09-13: qty 4

## 2013-09-13 MED ORDER — PANTOPRAZOLE SODIUM 20 MG PO TBEC: 40.0000 mg | DELAYED_RELEASE_TABLET | Freq: Every day | ORAL | Status: DC

## 2013-09-13 MED ORDER — CARVEDILOL 12.5 MG PO TABS: 12.5000 mg | ORAL_TABLET | Freq: Two times a day (BID) | ORAL | Status: DC

## 2013-09-13 MED ORDER — GI COCKTAIL ~~LOC~~
30.0000 mL | Freq: Once | ORAL | Status: AC
  Administered 2013-09-13: 30 mL via ORAL
  Filled 2013-09-13: qty 30

## 2013-09-13 MED ORDER — CARVEDILOL 12.5 MG PO TABS
12.5000 mg | ORAL_TABLET | Freq: Once | ORAL | Status: AC
  Administered 2013-09-13: 12.5 mg via ORAL
  Filled 2013-09-13: qty 1

## 2013-09-13 MED ORDER — ONDANSETRON HCL 4 MG PO TABS: 4.0000 mg | ORAL_TABLET | Freq: Four times a day (QID) | ORAL | Status: DC

## 2013-09-13 NOTE — ED Notes (Signed)
Reported temperature to Dr. Jodi Mourning.  MD acknowledges, no new orders at this time.

## 2013-09-13 NOTE — ED Notes (Signed)
Pt sts that he was here recently and we only treated his bp and did nothing for his pain when he was here.

## 2013-09-13 NOTE — ED Notes (Signed)
Pt here for persistant stomach pain, weak, not feeling well, sts here for same three weeks ago and today's symptoms started 3 days ago. Reports abdominal swelling, likely a hernia,

## 2013-09-13 NOTE — ED Provider Notes (Signed)
CSN: [id number]     Arrival date & time 09/13/13  1632 History   First MD Initiated Contact with Patient 09/13/13 1831     Chief Complaint  Patient presents with  . Fatigue     (Consider location/radiation/quality/duration/timing/severity/associated sxs/prior Treatment) HPI  This is a 68 y.o. male with PMH of DM, HTN, glaucoma, recently discharged earlier this month for gastroenteritis, presenting with abdominal pain.  It started well before presentation for last admission. Located in the epigastric area.  It waxes and wanes in severity. It is "sore." No alleviating or aggravating factors are identified. Negative for radiation. Positive for intermittent nausea, although patient is able to take blood pressure medication. Patient's wife also states that he has been displaying malaise lately. He has also been falling more frequently and, has been diffusely weak.  Past Medical History  Diagnosis Date  . Diabetes mellitus without complication   . Hypertension   . Glaucoma    Past Surgical History  Procedure Laterality Date  . Joint replacement    . Back surgery    . Cyst excision      on Back  . Total hip arthroplasty      Right    History reviewed. No pertinent family history. History  Substance Use Topics  . Smoking status: Never Smoker   . Smokeless tobacco: Not on file  . Alcohol Use: No    Review of Systems  Constitutional: Positive for fatigue. Negative for fever and chills.  HENT: Negative for facial swelling.   Eyes: Negative for pain and visual disturbance.  Respiratory: Negative for chest tightness and shortness of breath.   Cardiovascular: Negative for chest pain.  Gastrointestinal: Positive for nausea and abdominal pain. Negative for vomiting.  Genitourinary: Negative for dysuria.  Musculoskeletal: Negative for arthralgias and myalgias.  Neurological: Negative for headaches.      Allergies  Review of patient's allergies indicates no known allergies.  Home  Medications   Prior to Admission medications   Medication Sig Start Date End Date Taking? Authorizing Provider  ALPRAZolam Duanne Moron) 1 MG tablet Take 1 mg by mouth daily as needed for anxiety.  05/23/13  Yes Historical Provider, MD  aspirin EC 81 MG tablet Take 81 mg by mouth daily.   Yes Historical Provider, MD  bimatoprost (LUMIGAN) 0.03 % ophthalmic solution Place 1 drop into both eyes at bedtime.   Yes Historical Provider, MD  carvedilol (COREG) 12.5 MG tablet Take 12.5 mg by mouth 2 (two) times daily with a meal.   Yes Historical Provider, MD  FARXIGA 10 MG TABS Take 10 mg by mouth daily. 08/07/13  Yes Historical Provider, MD  Hydrocodone-Acetaminophen 7.5-300 MG TABS Take 1 tablet by mouth 2 (two) times daily as needed (for pain).  07/22/13  Yes Historical Provider, MD  LEVEMIR FLEXTOUCH 100 UNIT/ML Pen Inject 40 Units into the skin at bedtime. 08/04/13  Yes Historical Provider, MD  LYRICA 100 MG capsule Take 100 mg by mouth 4 (four) times daily.  08/01/13  Yes Historical Provider, MD  methocarbamol (ROBAXIN) 750 MG tablet Take 750 mg by mouth 2 (two) times daily as needed for muscle spasms.  06/11/13  Yes Historical Provider, MD  Multiple Vitamins-Minerals (CENTRUM SILVER ADULT 50+) TABS Take 1 tablet by mouth daily.   Yes Historical Provider, MD  NOVOLOG FLEXPEN 100 UNIT/ML FlexPen Inject 10 Units into the skin 3 (three) times daily with meals. 07/28/13  Yes Historical Provider, MD  Olmesartan-Amlodipine-HCTZ (TRIBENZOR) 40-10-25 MG TABS Take 1 tablet by  mouth daily.   Yes Historical Provider, MD  Oxycodone HCl 20 MG TABS Take 20 mg by mouth 2 (two) times daily.  07/22/13  Yes Historical Provider, MD  Tetrahydrozoline HCl (EYE DROPS OP) Apply 2 drops to eye 3 (three) times daily as needed (for dry eyes).   Yes Historical Provider, MD   BP 163/88  Pulse 87  Temp(Src) 98.4 F (36.9 C) (Oral)  Resp 18  Ht 6' 2.5" (1.892 m)  Wt 220 lb (99.791 kg)  BMI 27.88 kg/m2  SpO2 98% Physical Exam   Constitutional: He is oriented to person, place, and time. He appears well-developed and well-nourished. No distress.  HENT:  Head: Normocephalic and atraumatic.  Mouth/Throat: No oropharyngeal exudate.  Eyes: Conjunctivae are normal. Pupils are equal, round, and reactive to light. No scleral icterus.  Neck: Normal range of motion. No tracheal deviation present. No thyromegaly present.  Cardiovascular: Normal rate, regular rhythm and normal heart sounds.  Exam reveals no gallop and no friction rub.   No murmur heard. Pulmonary/Chest: Effort normal and breath sounds normal. No stridor. No respiratory distress. He has no wheezes. He has no rales. He exhibits no tenderness.  Abdominal: Soft. He exhibits no distension and no mass. There is tenderness (epigastric). There is no rebound and no guarding.  Musculoskeletal: Normal range of motion. He exhibits no edema.  Neurological: He is alert and oriented to person, place, and time.  Cranial nerves, sensation, motor function, coordination, reflexes intact.  Normal head impulse, test of skew examinations. Negative for nystagmus on my exam. Patient ambulates without complication and without assistance.  Skin: Skin is warm and dry. He is not diaphoretic.    ED Course  Procedures (including critical care time) Labs Review Labs Reviewed  CBC - Abnormal; Notable for the following:    MCH 35.2 (*)    All other components within normal limits  BASIC METABOLIC PANEL - Abnormal; Notable for the following:    Chloride 95 (*)    Glucose, Bld 230 (*)    GFR calc non Af Amer 87 (*)    All other components within normal limits  URINALYSIS, ROUTINE W REFLEX MICROSCOPIC  I-STAT TROPOININ, ED    Imaging Review Dg Chest 1 View  09/13/2013   CLINICAL DATA:  Weakness and fatigue.  EXAM: CHEST - 1 VIEW  COMPARISON:  DG CHEST 2 VIEW dated 08/13/2013  FINDINGS: Mediastinum and hilar structures are normal. Lungs are clear. Mild cardiomegaly, normal pulmonary  vascularity. No pleural effusion or pneumothorax. No acute bony abnormality.  IMPRESSION: Stable mild cardiomegaly, no acute cardiopulmonary disease.   Electronically Signed   By: Marcello Moores  Register   On: 09/13/2013 18:07     EKG Interpretation   Date/Time:  Tuesday September 13 2013 16:48:02 EDT Ventricular Rate:  88 PR Interval:  218 QRS Duration: 72 QT Interval:  384 QTC Calculation: 464 R Axis:   41 Text Interpretation:  Sinus rhythm with 1st degree A-V block Nonspecific T  wave abnormality Abnormal ECG No significant change was found Confirmed by  Wyvonnia Dusky  MD, STEPHEN [phone]) on 09/13/2013 7:30:53 PM      MDM   Final diagnoses:  None    This is a 68 y.o. male with PMH of DM, HTN, glaucoma, recently discharged earlier this month for gastroenteritis, presenting with abdominal pain.  It started well before presentation for last admission. Located in the epigastric area.  It waxes and wanes in severity. It is "sore." No alleviating or aggravating factors are  identified. Negative for radiation. Positive for intermittent nausea, although patient is able to take blood pressure medication. Patient's wife also states that he has been displaying malaise lately. He has also been falling more frequently and, has been diffusely weak.  Patient states that he fell last week and hit the occiput of his head. He has no headache at this time.  Examination is as above, with normal vital signs. Heart and lung examinations are within normal limits. Abdominal exam reveals tenderness to palpation in the epigastric area. Negative for rebound, rigidity, guarding. Patient's neurologic exam is within normal limits.   Pertaining to abdominal pain, the patient was diagnosed with gastritis, discharged on proton pump inhibitor. Unfortunately, the patient has not filled his prescription. I do not believe that his pain represents a structure in, perforation, ischemia, emergent inflammation, or any other concerning  etiology at this time. I believe that the pain is likely a continuation of the previously diagnosed condition. It was also recommended that he followup with gastroenterology; unfortunately this appointment has not been made.  My attending physician has presented to bedside to reevaluate the patient. He is attempted to ambulate the patient; unfortunately, the patient is unable to ambulate at this time. Due to progressive weakness, malaise, increase in frequency of falls, probably the admission is warranted at this time.  Patient is being admitted in stable condition.  I have discussed case and care has been guided by my attending physician, Dr. Wyvonnia Dusky.  Doy Hutching, MD 09/14/13 [phone]

## 2013-09-13 NOTE — ED Notes (Signed)
Dr. Wyvonnia Dusky and phlebotomy at the bedside.

## 2013-09-13 NOTE — ED Notes (Addendum)
Called CT to inform that patient is ready for transport, but the transporter arrived to take patient to X-ray. Patient transported to X-ray, and while in radiology, will have CT completed before return to [facility].

## 2013-09-13 NOTE — ED Notes (Signed)
Discussed need to void.  Patient will now attempt for urine sample.  He also requests apple sauce to eat.

## 2013-09-13 NOTE — ED Notes (Signed)
clarified care order, Dr. Jodi Mourning ordered a PO challenge.  Discussed with the patient that he still needs to submit urine sample.  Provided 240 mL of water to encourage urination, and provide PO challenge.

## 2013-09-13 NOTE — ED Notes (Signed)
Patient transported to X-ray 

## 2013-09-13 NOTE — ED Notes (Signed)
Discussed with the patient that sample is needed, if not able to void in the next few minutes, in and out cath may be needed.

## 2013-09-13 NOTE — ED Notes (Signed)
Clarified with Dr. Jodi Mourning that patient is to receive Coreg.  Dr. Jodi Mourning says patient is to receive one dose, will administer and continue to monitor.

## 2013-09-13 NOTE — ED Notes (Signed)
Patient still off unit for testing. Provided drink for family.

## 2013-09-13 NOTE — ED Notes (Signed)
Patient now refuses Norco medication because he says that he does not take this at home.  Will return and plan for discharge.

## 2013-09-13 NOTE — Discharge Instructions (Signed)
Gastritis, Adult  Gastritis is soreness and swelling (inflammation) of the lining of the stomach. Gastritis can develop as a sudden onset (acute) or long-term (chronic) condition. If gastritis is not treated, it can lead to stomach bleeding and ulcers.  CAUSES   Gastritis occurs when the stomach lining is weak or damaged. Digestive juices from the stomach then inflame the weakened stomach lining. The stomach lining may be weak or damaged due to viral or bacterial infections. One common bacterial infection is the Helicobacter pylori infection. Gastritis can also result from excessive alcohol consumption, taking certain medicines, or having too much acid in the stomach.   SYMPTOMS   In some cases, there are no symptoms. When symptoms are present, they may include:  · Pain or a burning sensation in the upper abdomen.  · Nausea.  · Vomiting.  · An uncomfortable feeling of fullness after eating.  DIAGNOSIS   Your caregiver may suspect you have gastritis based on your symptoms and a physical exam. To determine the cause of your gastritis, your caregiver may perform the following:  · Blood or stool tests to check for the H pylori bacterium.  · Gastroscopy. A thin, flexible tube (endoscope) is passed down the esophagus and into the stomach. The endoscope has a light and camera on the end. Your caregiver uses the endoscope to view the inside of the stomach.  · Taking a tissue sample (biopsy) from the stomach to examine under a microscope.  TREATMENT   Depending on the cause of your gastritis, medicines may be prescribed. If you have a bacterial infection, such as an H pylori infection, antibiotics may be given. If your gastritis is caused by too much acid in the stomach, H2 blockers or antacids may be given. Your caregiver may recommend that you stop taking aspirin, ibuprofen, or other nonsteroidal anti-inflammatory drugs (NSAIDs).  HOME CARE INSTRUCTIONS  · Only take over-the-counter or prescription medicines as directed by  your caregiver.  · If you were given antibiotic medicines, take them as directed. Finish them even if you start to feel better.  · Drink enough fluids to keep your urine clear or pale yellow.  · Avoid foods and drinks that make your symptoms worse, such as:  · Caffeine or alcoholic drinks.  · Chocolate.  · Peppermint or mint flavorings.  · Garlic and onions.  · Spicy foods.  · Citrus fruits, such as oranges, lemons, or limes.  · Tomato-based foods such as sauce, chili, salsa, and pizza.  · Fried and fatty foods.  · Eat small, frequent meals instead of large meals.  SEEK IMMEDIATE MEDICAL CARE IF:   · You have black or dark red stools.  · You vomit blood or material that looks like coffee grounds.  · You are unable to keep fluids down.  · Your abdominal pain gets worse.  · You have a fever.  · You do not feel better after 1 week.  · You have any other questions or concerns.  MAKE SURE YOU:  · Understand these instructions.  · Will watch your condition.  · Will get help right away if you are not doing well or get worse.  Document Released: 04/29/2001 Document Revised: 11/04/2011 Document Reviewed: 06/18/2011  [employer]® Patient Information ©2014 [employer].

## 2013-09-13 NOTE — ED Notes (Signed)
Spoke with Dr. Jodi Mourning. Patient requests pain medication.  Also explained to the patient that discharge and admission decisions are typically based on clinical findings.

## 2013-09-14 ENCOUNTER — Encounter (HOSPITAL_COMMUNITY): Payer: Self-pay | Admitting: Emergency Medicine

## 2013-09-14 ENCOUNTER — Inpatient Hospital Stay (HOSPITAL_COMMUNITY): Payer: BC Managed Care – PPO

## 2013-09-14 DIAGNOSIS — E119 Type 2 diabetes mellitus without complications: Secondary | ICD-10-CM | POA: Diagnosis present

## 2013-09-14 DIAGNOSIS — R109 Unspecified abdominal pain: Secondary | ICD-10-CM | POA: Diagnosis present

## 2013-09-14 DIAGNOSIS — R531 Weakness: Secondary | ICD-10-CM | POA: Diagnosis present

## 2013-09-14 DIAGNOSIS — R5381 Other malaise: Secondary | ICD-10-CM

## 2013-09-14 DIAGNOSIS — I10 Essential (primary) hypertension: Secondary | ICD-10-CM | POA: Diagnosis present

## 2013-09-14 DIAGNOSIS — R5383 Other fatigue: Secondary | ICD-10-CM

## 2013-09-14 LAB — CBC WITH DIFFERENTIAL/PLATELET
BASOS ABS: 0 10*3/uL (ref 0.0–0.1)
BASOS PCT: 0 % (ref 0–1)
EOS ABS: 0 10*3/uL (ref 0.0–0.7)
Eosinophils Relative: 0 % (ref 0–5)
HCT: 43.8 % (ref 39.0–52.0)
HEMOGLOBIN: 15 g/dL (ref 13.0–17.0)
Lymphocytes Relative: 21 % (ref 12–46)
Lymphs Abs: 1.6 10*3/uL (ref 0.7–4.0)
MCH: 34.1 pg — AB (ref 26.0–34.0)
MCHC: 34.2 g/dL (ref 30.0–36.0)
MCV: 99.5 fL (ref 78.0–100.0)
MONOS PCT: 15 % — AB (ref 3–12)
Monocytes Absolute: 1.1 10*3/uL — ABNORMAL HIGH (ref 0.1–1.0)
NEUTROS ABS: 4.7 10*3/uL (ref 1.7–7.7)
NEUTROS PCT: 64 % (ref 43–77)
Platelets: 262 10*3/uL (ref 150–400)
RBC: 4.4 MIL/uL (ref 4.22–5.81)
RDW: 12.3 % (ref 11.5–15.5)
WBC: 7.4 10*3/uL (ref 4.0–10.5)

## 2013-09-14 LAB — GLUCOSE, CAPILLARY
GLUCOSE-CAPILLARY: 303 mg/dL — AB (ref 70–99)
GLUCOSE-CAPILLARY: 208 mg/dL — AB (ref 70–99)
Glucose-Capillary: 251 mg/dL — ABNORMAL HIGH (ref 70–99)
Glucose-Capillary: 218 mg/dL — ABNORMAL HIGH (ref 70–99)

## 2013-09-14 LAB — TSH: TSH: 2.09 u[IU]/mL (ref 0.350–4.500)

## 2013-09-14 LAB — HEPATIC FUNCTION PANEL
ALK PHOS: 52 U/L (ref 39–117)
ALT: 19 U/L (ref 0–53)
AST: 23 U/L (ref 0–37)
Albumin: 3.9 g/dL (ref 3.5–5.2)
Bilirubin, Direct: <0.2 mg/dL (ref 0.0–0.3)
TOTAL PROTEIN: 8.1 g/dL (ref 6.0–8.3)
Total Bilirubin: 1 mg/dL (ref 0.3–1.2)

## 2013-09-14 LAB — URINALYSIS, ROUTINE W REFLEX MICROSCOPIC
Bilirubin Urine: NEGATIVE
Glucose, UA: >1000 mg/dL — AB
HGB URINE DIPSTICK: NEGATIVE
Ketones, ur: 15 mg/dL — AB
LEUKOCYTES UA: NEGATIVE
Nitrite: NEGATIVE
Protein, ur: 100 mg/dL — AB
Specific Gravity, Urine: 1.029 (ref 1.005–1.030)
UROBILINOGEN UA: 0.2 mg/dL (ref 0.0–1.0)
pH: 5 (ref 5.0–8.0)

## 2013-09-14 LAB — CK: Total CK: 126 U/L (ref 7–232)

## 2013-09-14 LAB — BASIC METABOLIC PANEL
BUN: 25 mg/dL — ABNORMAL HIGH (ref 6–23)
CALCIUM: 9.3 mg/dL (ref 8.4–10.5)
CO2: 24 mEq/L (ref 19–32)
CREATININE: 0.97 mg/dL (ref 0.50–1.35)
Chloride: 92 mEq/L — ABNORMAL LOW (ref 96–112)
GFR calc non Af Amer: 84 mL/min — ABNORMAL LOW (ref >90)
Glucose, Bld: 272 mg/dL — ABNORMAL HIGH (ref 70–99)
Potassium: 4.1 mEq/L (ref 3.7–5.3)
Sodium: 133 mEq/L — ABNORMAL LOW (ref 137–147)

## 2013-09-14 LAB — URINE MICROSCOPIC-ADD ON

## 2013-09-14 LAB — LIPASE, BLOOD: Lipase: 17 U/L (ref 11–59)

## 2013-09-14 LAB — CORTISOL: CORTISOL PLASMA: 17.4 ug/dL

## 2013-09-14 LAB — TROPONIN I: Troponin I: <0.3 ng/mL (ref <0.30)

## 2013-09-14 MED ORDER — HYDROCODONE-ACETAMINOPHEN 7.5-300 MG PO TABS: 1.0000 | ORAL_TABLET | Freq: Two times a day (BID) | ORAL | Status: DC | PRN

## 2013-09-14 MED ORDER — IRBESARTAN 300 MG PO TABS
300.0000 mg | ORAL_TABLET | Freq: Every day | ORAL | Status: DC
  Administered 2013-09-14: 300 mg via ORAL
  Filled 2013-09-14 (×2): qty 1

## 2013-09-14 MED ORDER — DOCUSATE SODIUM 100 MG PO CAPS
200.0000 mg | ORAL_CAPSULE | Freq: Two times a day (BID) | ORAL | Status: DC
  Administered 2013-09-14 – 2013-09-17 (×6): 200 mg via ORAL
  Filled 2013-09-14 (×7): qty 2

## 2013-09-14 MED ORDER — HYDROCODONE-ACETAMINOPHEN 7.5-325 MG PO TABS
1.0000 | ORAL_TABLET | Freq: Two times a day (BID) | ORAL | Status: DC | PRN
  Administered 2013-09-16: 1 via ORAL
  Filled 2013-09-14: qty 1

## 2013-09-14 MED ORDER — INSULIN DETEMIR 100 UNIT/ML FLEXPEN: 40.0000 [IU] | PEN_INJECTOR | Freq: Every day | SUBCUTANEOUS | Status: DC

## 2013-09-14 MED ORDER — INSULIN ASPART 100 UNIT/ML ~~LOC~~ SOLN
0.0000 [IU] | Freq: Three times a day (TID) | SUBCUTANEOUS | Status: DC
  Administered 2013-09-14: 3 [IU] via SUBCUTANEOUS
  Administered 2013-09-14: 5 [IU] via SUBCUTANEOUS
  Administered 2013-09-14: 7 [IU] via SUBCUTANEOUS
  Administered 2013-09-15: 3 [IU] via SUBCUTANEOUS
  Administered 2013-09-15: 2 [IU] via SUBCUTANEOUS
  Administered 2013-09-15: 5 [IU] via SUBCUTANEOUS
  Administered 2013-09-16: 3 [IU] via SUBCUTANEOUS
  Administered 2013-09-16: 2 [IU] via SUBCUTANEOUS
  Administered 2013-09-16: 5 [IU] via SUBCUTANEOUS
  Administered 2013-09-17: 1 [IU] via SUBCUTANEOUS

## 2013-09-14 MED ORDER — ONDANSETRON HCL 4 MG PO TABS: 4.0000 mg | ORAL_TABLET | Freq: Four times a day (QID) | ORAL | Status: DC | PRN

## 2013-09-14 MED ORDER — ACETAMINOPHEN 650 MG RE SUPP: 650.0000 mg | Freq: Four times a day (QID) | RECTAL | Status: DC | PRN

## 2013-09-14 MED ORDER — OXYCODONE HCL 5 MG PO TABS
20.0000 mg | ORAL_TABLET | Freq: Two times a day (BID) | ORAL | Status: DC
  Administered 2013-09-14 – 2013-09-17 (×7): 20 mg via ORAL
  Filled 2013-09-14 (×15): qty 4

## 2013-09-14 MED ORDER — MORPHINE SULFATE 2 MG/ML IJ SOLN: 1.0000 mg | INTRAMUSCULAR | Status: DC | PRN

## 2013-09-14 MED ORDER — INSULIN DETEMIR 100 UNIT/ML ~~LOC~~ SOLN
40.0000 [IU] | Freq: Every day | SUBCUTANEOUS | Status: DC
  Administered 2013-09-14: 40 [IU] via SUBCUTANEOUS
  Filled 2013-09-14 (×2): qty 0.4

## 2013-09-14 MED ORDER — HYDROCHLOROTHIAZIDE 25 MG PO TABS
25.0000 mg | ORAL_TABLET | Freq: Every day | ORAL | Status: DC
  Administered 2013-09-14: 25 mg via ORAL
  Filled 2013-09-14 (×2): qty 1

## 2013-09-14 MED ORDER — PANTOPRAZOLE SODIUM 40 MG IV SOLR
40.0000 mg | Freq: Every day | INTRAVENOUS | Status: DC
  Administered 2013-09-14 (×2): 40 mg via INTRAVENOUS
  Filled 2013-09-14 (×3): qty 40

## 2013-09-14 MED ORDER — SODIUM CHLORIDE 0.9 % IV SOLN
INTRAVENOUS | Status: DC
  Administered 2013-09-14: 05:00:00 via INTRAVENOUS

## 2013-09-14 MED ORDER — LEVOFLOXACIN IN D5W 750 MG/150ML IV SOLN
750.0000 mg | INTRAVENOUS | Status: DC
  Administered 2013-09-14: 750 mg via INTRAVENOUS
  Filled 2013-09-14 (×2): qty 150

## 2013-09-14 MED ORDER — LATANOPROST 0.005 % OP SOLN
1.0000 [drp] | Freq: Every day | OPHTHALMIC | Status: DC
  Administered 2013-09-14 – 2013-09-16 (×3): 1 [drp] via OPHTHALMIC
  Filled 2013-09-14: qty 2.5

## 2013-09-14 MED ORDER — METHOCARBAMOL 750 MG PO TABS
750.0000 mg | ORAL_TABLET | Freq: Two times a day (BID) | ORAL | Status: DC | PRN
  Filled 2013-09-14: qty 1

## 2013-09-14 MED ORDER — DAPAGLIFLOZIN PROPANEDIOL 10 MG PO TABS
10.0000 mg | ORAL_TABLET | Freq: Every day | ORAL | Status: DC
  Administered 2013-09-15 – 2013-09-17 (×3): 10 mg via ORAL

## 2013-09-14 MED ORDER — ALPRAZOLAM 0.5 MG PO TABS
1.0000 mg | ORAL_TABLET | Freq: Every day | ORAL | Status: DC | PRN
  Administered 2013-09-14 – 2013-09-16 (×4): 1 mg via ORAL
  Filled 2013-09-14 (×4): qty 2

## 2013-09-14 MED ORDER — ASPIRIN EC 81 MG PO TBEC
81.0000 mg | DELAYED_RELEASE_TABLET | Freq: Every day | ORAL | Status: DC
  Administered 2013-09-14 – 2013-09-17 (×4): 81 mg via ORAL
  Filled 2013-09-14 (×4): qty 1

## 2013-09-14 MED ORDER — HYDRALAZINE HCL 20 MG/ML IJ SOLN
10.0000 mg | INTRAMUSCULAR | Status: DC | PRN
Start: 2013-09-14 — End: 2013-09-17
  Administered 2013-09-14: 10 mg via INTRAVENOUS
  Filled 2013-09-14: qty 1

## 2013-09-14 MED ORDER — ONDANSETRON HCL 4 MG/2ML IJ SOLN: 4.0000 mg | Freq: Four times a day (QID) | INTRAMUSCULAR | Status: DC | PRN

## 2013-09-14 MED ORDER — ACETAMINOPHEN 325 MG PO TABS: 650.0000 mg | ORAL_TABLET | Freq: Four times a day (QID) | ORAL | Status: DC | PRN

## 2013-09-14 MED ORDER — AMLODIPINE BESYLATE 10 MG PO TABS
10.0000 mg | ORAL_TABLET | Freq: Every day | ORAL | Status: DC
  Administered 2013-09-14 – 2013-09-17 (×4): 10 mg via ORAL
  Filled 2013-09-14 (×5): qty 1

## 2013-09-14 MED ORDER — PREGABALIN 50 MG PO CAPS
100.0000 mg | ORAL_CAPSULE | Freq: Four times a day (QID) | ORAL | Status: DC
  Administered 2013-09-14 (×2): 100 mg via ORAL
  Filled 2013-09-14 (×3): qty 2

## 2013-09-14 MED ORDER — OLMESARTAN-AMLODIPINE-HCTZ 40-10-25 MG PO TABS: 1.0000 | ORAL_TABLET | Freq: Every day | ORAL | Status: DC

## 2013-09-14 NOTE — Evaluation (Signed)
Physical Therapy Evaluation Patient Details Name: David [name] MRN: [id number] DOB: 1945/08/18 Today's Date: 09/14/2013   History of Present Illness  [name] is a 68 y.o. male with history of diabetes mellitus, hypertension, chronic back pain who was recently admitted for nausea vomiting and abdominal pain 4  weeks ago presents to the ER with complaints of increasing weakness and fatigue and abdominal pain.  Clinical Impression  Patient presents with decreased independence and safety with mobility due to deficits listed in PT problem list.  He will benefit from skilled PT in the acute setting to allow return home with intermittent family assist and outpatient PT follow up.    Follow Up Recommendations Outpatient PT    Equipment Recommendations  None recommended by PT    Recommendations for Other Services       Precautions / Restrictions Precautions Precautions: Fall Precaution Comments: 4 falls in past month       Mobility  Bed Mobility Overal bed mobility: Modified Independent                Transfers Overall transfer level: Modified independent Equipment used: Quad cane                Ambulation/Gait Ambulation/Gait assistance: Min assist Ambulation Distance (Feet): 120 Feet Assistive device: Quad cane (and occasional use of wall rail) Gait Pattern/deviations: Step-through pattern;Wide base of support;Trendelenburg;Trunk flexed     General Gait Details: c/o severe fatigue esp left hip with ambulation, reports h/o bursitis left hip and has had one steriod injection month or so ago.  Improper use of quad cane rocking back and forth with step throught pattern and flexed posture and unsafe pattern with one loss of balance min assist to recover  Stairs            Wheelchair Mobility    Modified Rankin (Stroke Patients Only)       Balance Overall balance assessment: History of Falls;Needs assistance   Sitting balance-Leahy Scale:  Good     Standing balance support: Single extremity supported Standing balance-Leahy Scale: Poor Standing balance comment: wide base of support in standing with flexed posture and poor proprioceptive awareness.                             Pertinent Vitals/Pain No pain at rest, 9/10 left hip pain after ambulation RN aware and cold applied    Home Living Family/patient expects to be discharged to:: Private residence Living Arrangements: Spouse/significant other   Type of Home: House Home Access: Stairs to enter Entrance Stairs-Rails: Right;Left;Can reach both Technical brewer of Steps: 3 Home Layout: Two level;Bed/bath upstairs Home Equipment: Cane - quad;Cane - single point      Prior Function Level of Independence: Independent with assistive device(s)         Comments: 4 falls in past mont; Environmental consultant at [employer] Hand: Right    Extremity/Trunk Assessment               Lower Extremity Assessment: RLE deficits/detail;LLE deficits/detail RLE Deficits / Details: AROM WFL, strength hip flexion 4/5, knee extension 4+/5, ankle DF 4/5 LLE Deficits / Details: AROM WFL, strength hip flexion 4-/5, knee extension 4/5. ankle DF 3+/5  Cervical / Trunk Assessment: Kyphotic  Communication   Communication: No difficulties  Cognition Arousal/Alertness: Awake/alert Behavior During Therapy: WFL for tasks assessed/performed Overall Cognitive Status: Within Functional  Limits for tasks assessed                      General Comments      Exercises Education: Educated patient on use of foot wear for neuropathy and benefit of water walking over impact exercise for joint protection with neuropathy, h/o back surgery and right THA, left hip bursitis.  also discussed outpatient PT and pt wishes to go to [facility] orthopedics as he has been there in the past.  Also demonstrated proper gait pattern with both quad cane and  SPC.  Patient not willing to try more due to left hip pain.      Assessment/Plan    PT Assessment Patient needs continued PT services  PT Diagnosis Abnormality of gait;Acute pain;Generalized weakness   PT Problem List Decreased strength;Decreased mobility;Decreased safety awareness;Decreased knowledge of use of DME;Decreased balance;Decreased activity tolerance;Pain  PT Treatment Interventions DME instruction;Therapeutic exercise;Balance training;Gait training;Stair training;Functional mobility training;Therapeutic activities;Patient/family education;Modalities   PT Goals (Current goals can be found in the Care Plan section) Acute Rehab PT Goals Patient Stated Goal: To improve strength PT Goal Formulation: With patient Time For Goal Achievement: 09/21/13 Potential to Achieve Goals: Good    Frequency Min 3X/week   Barriers to discharge        Co-evaluation               End of Session Equipment Utilized During Treatment: Gait belt Activity Tolerance: Patient limited by pain;Patient limited by fatigue Patient left: in bed;with call bell/phone within reach Nurse Communication: Patient requests pain meds         Time: [id number] PT Time Calculation (min): 44 min   Charges:   PT Evaluation $Initial PT Evaluation Tier I: 1 Procedure PT Treatments $Gait Training: 8-22 mins $Self Care/Home Management: 8-22   PT G Codes:          Max Sane 09/14/2013, 12:06 PM Magda Kiel, [place] 09/14/2013

## 2013-09-14 NOTE — Progress Notes (Deleted)
PT Cancellation Note  Patient Details Name: David [name] MRN: [id number] DOB: Feb 16, 1946   Cancelled Treatment:    Reason Eval/Treat Not Completed: Other (comment); patient reports anxious to move leg too much just after surgery due to multiple revisions of right BKA due to bleeding/wound dehiscence, etc.  Will attempt to see tomorrow.   Max Sane 09/14/2013, 11:54 AM

## 2013-09-14 NOTE — Progress Notes (Signed)
I have seen and examined Mr Mulry at bedside and reviewed his chart. Appreciate PT/Neurology. Patient was admitted this morning by Dr. Joelyn Oms with complaints of weakness and low grade fever. There is question of GB syndrome. Review of his medical record shows that patient had cystitis last month but he tells me he didn't get antibiotics. Will therefore start him on Levaquin pending cultures. Will also obtain HIV test/RPR/vitamin B 12 level/sedimentation rate/ANA in addition to other interventions. Please refer to Dr. Marthann Schiller comprehensive assessment for details.

## 2013-09-14 NOTE — ED Provider Notes (Signed)
I saw and evaluated the patient, reviewed the resident's note and I agree with the findings and plan. If applicable, I agree with the resident's interpretation of the EKG.  If applicable, I was present for critical portions of any procedures performed.  Weakness, fatigue, difficulty walking since hospitalization fro hypertension and GI illness last month. On going epigastric pain and nausea is unchanged. He did not follow up as scheduled. Recent CT neg. No focal weakness on exam. Patient able to ambulate with encouragement. Unable to obtain patellar reflexes. D/w Dr. Doy Mince who does not feel there is concern for GBS.  Ezequiel Essex, MD 09/14/13 1014

## 2013-09-14 NOTE — H&P (Signed)
[facility] History and Physical  David [name] [id number] DOB: [birth year] DOA: 09/13/2013  Referring physician: ER physician. PCP: Leola Brazil, MD  Chief Complaint: Weakness and abdominal pain.  HPI: David [name] is a 68 y.o. male with history of diabetes mellitus, hypertension, chronic back pain who was recently admitted for nausea vomiting and abdominal pain 4  weeks ago presents to the ER with complaints of increasing weakness and fatigue and abdominal pain. Patient states that since his last discharge he has been feeling very fatigued and weak. He also has increasing epigastric pain and pain increases on eating. Has nausea but denies any vomiting or diarrhea. In the ER patient's blood pressure was found to be elevated and was given IV labetalol following which his blood pressure improved. Patient also was found to be mildly febrile for which blood cultures have been ordered. Chest x-ray is pending. Abdominal x-ray did not show anything acute. UA is unremarkable. Patient on exam is able to move all extremities 5 x 5 but states that when he tries to walk his legs give away. Denies any incontinence of urine or bowel. Denies any headache or any visual symptoms. CT head is negative for anything acute.  Review of Systems: As presented in the history of presenting illness, rest negative.  Past Medical History  Diagnosis Date  . Diabetes mellitus without complication   . Hypertension   . Glaucoma    Past Surgical History  Procedure Laterality Date  . Joint replacement    . Back surgery    . Cyst excision      on Back  . Total hip arthroplasty      Right    Social History:  reports that he has never smoked. He does not have any smokeless tobacco history on file. He reports that he does not drink alcohol or use illicit drugs. Where does patient live home. Can patient participate in ADLs? Not sure.  No Known Allergies  Family History: History reviewed. No  pertinent family history.    Prior to Admission medications   Medication Sig Start Date End Date Taking? Authorizing Provider  ALPRAZolam Duanne Moron) 1 MG tablet Take 1 mg by mouth daily as needed for anxiety.  05/23/13  Yes Historical Provider, MD  aspirin EC 81 MG tablet Take 81 mg by mouth daily.   Yes Historical Provider, MD  bimatoprost (LUMIGAN) 0.03 % ophthalmic solution Place 1 drop into both eyes at bedtime.   Yes Historical Provider, MD  carvedilol (COREG) 12.5 MG tablet Take 12.5 mg by mouth 2 (two) times daily with a meal.   Yes Historical Provider, MD  FARXIGA 10 MG TABS Take 10 mg by mouth daily. 08/07/13  Yes Historical Provider, MD  Hydrocodone-Acetaminophen 7.5-300 MG TABS Take 1 tablet by mouth 2 (two) times daily as needed (for pain).  07/22/13  Yes Historical Provider, MD  LEVEMIR FLEXTOUCH 100 UNIT/ML Pen Inject 40 Units into the skin at bedtime. 08/04/13  Yes Historical Provider, MD  LYRICA 100 MG capsule Take 100 mg by mouth 4 (four) times daily.  08/01/13  Yes Historical Provider, MD  methocarbamol (ROBAXIN) 750 MG tablet Take 750 mg by mouth 2 (two) times daily as needed for muscle spasms.  06/11/13  Yes Historical Provider, MD  Multiple Vitamins-Minerals (CENTRUM SILVER ADULT 50+) TABS Take 1 tablet by mouth daily.   Yes Historical Provider, MD  NOVOLOG FLEXPEN 100 UNIT/ML FlexPen Inject 10 Units into the skin 3 (three) times daily with meals. 07/28/13  Yes Historical Provider, MD  Olmesartan-Amlodipine-HCTZ (TRIBENZOR) 40-10-25 MG TABS Take 1 tablet by mouth daily.   Yes Historical Provider, MD  Oxycodone HCl 20 MG TABS Take 20 mg by mouth 2 (two) times daily.  07/22/13  Yes Historical Provider, MD  Tetrahydrozoline HCl (EYE DROPS OP) Apply 2 drops to eye 3 (three) times daily as needed (for dry eyes).   Yes Historical Provider, MD  ondansetron (ZOFRAN) 4 MG tablet Take 1 tablet (4 mg total) by mouth every 6 (six) hours. 09/13/13   Doy Hutching, MD  pantoprazole (PROTONIX) 20 MG  tablet Take 2 tablets (40 mg total) by mouth daily. 09/13/13   Doy Hutching, MD    Physical Exam: Filed Vitals:   09/14/13 [id number] 09/14/13 0116 09/14/13 0142 09/14/13 0220  BP: 140/86  173/95 163/90  Pulse: 97  90 88  Temp: 99.4 F (37.4 C) 99.3 F (37.4 C) 100.1 F (37.8 C) 98.8 F (37.1 C)  TempSrc: Oral  Oral Oral  Resp: 20  22 18   Height:    6' 2.5" (1.892 m)  Weight:    101.061 kg (222 lb 12.8 oz)  SpO2: 100%  99% 98%     General:  Well-developed and nourished.  Eyes: Anicteric no pallor.  ENT: No discharge from the ears eyes nose mouth.  Neck: No mass felt.  Cardiovascular: S1-S2 heard.  Respiratory: No rhonchi or crepitations.  Abdomen: Soft nontender bowel sounds present. No guarding or rigidity.  Skin: No rash.  Musculoskeletal: No edema.  Psychiatric: Appears normal.  Neurologic: Alert awake oriented to time place and person. Moves all extremities 5 x 5. No facial asymmetry tongue is midline. Deep tendon reflexes not elicitable.  Labs on Admission:  Basic Metabolic Panel:  Recent Labs Lab 09/13/13 1655  NA 137  K 4.0  CL 95*  CO2 27  GLUCOSE 230*  BUN 19  CREATININE 0.89  CALCIUM 9.9   Liver Function Tests: No results found for this basename: AST, ALT, ALKPHOS, BILITOT, PROT, ALBUMIN,  in the last 168 hours No results found for this basename: LIPASE, AMYLASE,  in the last 168 hours No results found for this basename: AMMONIA,  in the last 168 hours CBC:  Recent Labs Lab 09/13/13 1655  WBC 6.8  HGB 16.0  HCT 44.8  MCV 98.7  PLT 273   Cardiac Enzymes: No results found for this basename: CKTOTAL, CKMB, CKMBINDEX, TROPONINI,  in the last 168 hours  BNP (last 3 results) No results found for this basename: PROBNP,  in the last 8760 hours CBG: No results found for this basename: GLUCAP,  in the last 168 hours  Radiological Exams on Admission: Dg Chest 1 View  09/13/2013   CLINICAL DATA:  Weakness and fatigue.  EXAM: CHEST - 1 VIEW   COMPARISON:  DG CHEST 2 VIEW dated 08/13/2013  FINDINGS: Mediastinum and hilar structures are normal. Lungs are clear. Mild cardiomegaly, normal pulmonary vascularity. No pleural effusion or pneumothorax. No acute bony abnormality.  IMPRESSION: Stable mild cardiomegaly, no acute cardiopulmonary disease.   Electronically Signed   By: Marcello Moores  Register   On: 09/13/2013 18:07   Dg Abd 1 View  09/13/2013   CLINICAL DATA:  Upper mid abdominal pain, fatigue and nausea  EXAM: ABDOMEN - 1 VIEW  COMPARISON:  CT scan abdomen/ pelvis 08/14/2013  FINDINGS: No evidence of obstruction. Unremarkable bowel gas pattern. Colonic stool burden within normal limits. No acute osseous abnormality. Surgical changes of prior right total hip arthroplasty. Degenerative spurring  noted in the thoracic spine.  IMPRESSION: Negative.   Electronically Signed   By: Jacqulynn Cadet M.D.   On: 09/13/2013 21:00   Ct Head Wo Contrast  09/13/2013   CLINICAL DATA:  Fatigue.  EXAM: CT HEAD WITHOUT CONTRAST  TECHNIQUE: Contiguous axial images were obtained from the base of the skull through the vertex without intravenous contrast.  COMPARISON:  None.  FINDINGS: No mass. No hydrocephalus. No hemorrhage. No acute bony abnormality. Visualized paranasal sinuses and mastoids are clear.  IMPRESSION: No acute abnormality.   Electronically Signed   By: Marcello Moores  Register   On: 09/13/2013 21:02     Assessment/Plan Principal Problem:   Weakness Active Problems:   Chronic back pain   Abdominal pain   Hypertension, uncontrolled   Diabetes mellitus   1. Weakness and fatigue and difficulty walking - at this time I have ordered to check CK levels TSH and cortisol levels. I have consulted neurologist Dr. Doy Mince for further recommendations. 2. Abdominal pain - check LFTs and lipase. Patient was originally to followup with gastroenterologist. For now I place patient on Protonix IV. Recent CAT scan was negative for anything acute. May consult  gastroenterologist in a.m. 3. Mild fever - follow blood cultures and chest x-ray. 4. Hypertension uncontrolled -  Continue home medications. I have placed patient on when necessary IV hydralazine for systolic blood pressure more than 160. 5. Diabetes mellitus type 2 - continue home medications. Patient is on long-acting insulin and presently patient is on clear liquid diet. Closely follow CBGs.    Code Status: Full code.  Family Communication: Family at the bedside.  Disposition Plan: Admit to inpatient.    [facility] Pager [phone].  If 7PM-7AM, please contact night-coverage [email or web address] Password [email or web address] 09/14/2013, 4:00 AM

## 2013-09-14 NOTE — ED Notes (Signed)
Stroke swallow test not completed due to patient non-compliance.  He eventually sat upright to drink from cup without straw, but did not drink from the straw because "he didn't want anymore".  He did eat the graham crackers as he said he "was hungry".

## 2013-09-14 NOTE — ED Notes (Signed)
Pt still unable to void at this time 

## 2013-09-14 NOTE — ED Notes (Signed)
Reported to Dr. Jodi Mourning that patient has not yet submitted urine sample.  MD acknowledges, no new orders at this time.

## 2013-09-14 NOTE — ED Notes (Signed)
Attempted to call report

## 2013-09-14 NOTE — Progress Notes (Signed)
Utilization review completed.  

## 2013-09-14 NOTE — ED Notes (Signed)
Went to conduct swallow screen, and patient was attempting to void.  Now complaining of frequent headaches.

## 2013-09-14 NOTE — Consult Note (Addendum)
Reason for Consult:Weakness  Referring Physician: Hal Hope  CC: Weakness, fatigue  HPI: David [name] is an 68 y.o. male who was recently admitted for nausea vomiting and abdominal pain 4 weeks ago presents to the ER with complaints of increasing weakness and fatigue and abdominal pain. Patient states that since his last discharge he has been feeling very fatigued and weak. He has a cane to walk with at baseline but does not fel dependent on it.  Since discharge he is dependent on the cane. He has fallen multiple times.  He is unable to get through a full week of work due to fatigue.  In the ER patient's blood pressure was found to be elevated.  Patient also was found to be mildly febrile for which blood cultures have been ordered. Denies any incontinence of urine or bowel. Denies any headache or any visual symptoms.    Past Medical History  Diagnosis Date  . Diabetes mellitus without complication   . Hypertension   . Glaucoma     Past Surgical History  Procedure Laterality Date  . Joint replacement    . Back surgery    . Cyst excision      on Back  . Total hip arthroplasty      Right     Family history: Father died of natural causes.  Mother died in a car accident  Social History:  reports that he has never smoked. He does not have any smokeless tobacco history on file. He reports that he does not drink alcohol or use illicit drugs.  No Known Allergies  Medications:  I have reviewed the patient's current medications. Prior to Admission:  Prescriptions prior to admission  Medication Sig Dispense Refill  . ALPRAZolam (XANAX) 1 MG tablet Take 1 mg by mouth daily as needed for anxiety.       Marland Kitchen aspirin EC 81 MG tablet Take 81 mg by mouth daily.      . bimatoprost (LUMIGAN) 0.03 % ophthalmic solution Place 1 drop into both eyes at bedtime.      . carvedilol (COREG) 12.5 MG tablet Take 12.5 mg by mouth 2 (two) times daily with a meal.      . FARXIGA 10 MG TABS Take 10 mg by  mouth daily.      . Hydrocodone-Acetaminophen 7.5-300 MG TABS Take 1 tablet by mouth 2 (two) times daily as needed (for pain).       Ernest Mallick FLEXTOUCH 100 UNIT/ML Pen Inject 40 Units into the skin at bedtime.      Marland Kitchen LYRICA 100 MG capsule Take 100 mg by mouth 4 (four) times daily.       . methocarbamol (ROBAXIN) 750 MG tablet Take 750 mg by mouth 2 (two) times daily as needed for muscle spasms.       . Multiple Vitamins-Minerals (CENTRUM SILVER ADULT 50+) TABS Take 1 tablet by mouth daily.      Marland Kitchen NOVOLOG FLEXPEN 100 UNIT/ML FlexPen Inject 10 Units into the skin 3 (three) times daily with meals.      . Olmesartan-Amlodipine-HCTZ (TRIBENZOR) 40-10-25 MG TABS Take 1 tablet by mouth daily.      . Oxycodone HCl 20 MG TABS Take 20 mg by mouth 2 (two) times daily.       . Tetrahydrozoline HCl (EYE DROPS OP) Apply 2 drops to eye 3 (three) times daily as needed (for dry eyes).       Scheduled: . amLODipine  10 mg Oral Daily  .  aspirin EC  81 mg Oral Daily  . Dapagliflozin Propanediol  10 mg Oral Daily  . hydrochlorothiazide  25 mg Oral Daily  . insulin aspart  0-9 Units Subcutaneous TID WC  . insulin detemir  40 Units Subcutaneous QHS  . irbesartan  300 mg Oral Daily  . latanoprost  1 drop Both Eyes QHS  . oxyCODONE  20 mg Oral BID  . pantoprazole (PROTONIX) IV  40 mg Intravenous Q2200  . pregabalin  100 mg Oral QID    ROS: History obtained from the patient  General ROS: poor appetite Psychological ROS: negative for - behavioral disorder, hallucinations, memory difficulties, mood swings or suicidal ideation Ophthalmic ROS: negative for - blurry vision, double vision, eye pain or loss of vision ENT ROS: negative for - epistaxis, nasal discharge, oral lesions, sore throat, tinnitus or vertigo Allergy and Immunology ROS: negative for - hives or itchy/watery eyes Hematological and Lymphatic ROS: negative for - bleeding problems, bruising or swollen lymph nodes Endocrine ROS: negative for -  galactorrhea, hair pattern changes, polydipsia/polyuria or temperature intolerance Respiratory ROS: negative for - cough, hemoptysis, shortness of breath or wheezing Cardiovascular ROS: negative for - chest pain, dyspnea on exertion, edema or irregular heartbeat Gastrointestinal ROS: abdominal pain Genito-Urinary ROS: negative for - dysuria, hematuria, incontinence or urinary frequency/urgency Musculoskeletal ROS: negative for - joint swelling or muscular weakness Neurological ROS: as noted in HPI Dermatological ROS: negative for rash and skin lesion changes  Physical Examination: Blood pressure 164/79, pulse 90, temperature 97.8 F (36.6 C), temperature source Oral, resp. rate 18, height 6' 2.5" (1.892 m), weight 101.061 kg (222 lb 12.8 oz), SpO2 100.00%.  Neurologic Examination Mental Status: Lethargic, oriented, thought content appropriate.  Speech fluent without evidence of aphasia.  Able to follow 3 step commands without difficulty. Cranial Nerves: II: Discs flat bilaterally; Visual fields grossly normal, pupils equal, round, reactive to light and accommodation III,IV, VI: ptosis not present, extra-ocular motions intact bilaterally V,VII: smile symmetric, facial light touch sensation normal bilaterally VIII: hearing normal bilaterally IX,X: gag reflex present XI: bilateral shoulder shrug XII: midline tongue extension Motor: Right : Upper extremity   5/5    Left:     Upper extremity   5/5  Lower extremity   5/5     Lower extremity   5/5 Tone and bulk:normal tone throughout; no atrophy noted Sensory: No lateralizing sensory loss.  Stocking distribution of sensory loss in the lower extremities. Deep Tendon Reflexes: 2+ in the upper extremities and absent in the lower extremities Plantars: Right: downgoing   Left: downgoing Cerebellar: normal finger-to-nose and normal heel-to-shin test Gait: Unable to test CV: pulses palpable throughout     Laboratory Studies:   Basic  Metabolic Panel:  Recent Labs Lab 09/13/13 1655  NA 137  K 4.0  CL 95*  CO2 27  GLUCOSE 230*  BUN 19  CREATININE 0.89  CALCIUM 9.9    Liver Function Tests: No results found for this basename: AST, ALT, ALKPHOS, BILITOT, PROT, ALBUMIN,  in the last 168 hours No results found for this basename: LIPASE, AMYLASE,  in the last 168 hours No results found for this basename: AMMONIA,  in the last 168 hours  CBC:  Recent Labs Lab 09/13/13 1655 09/14/13 0447  WBC 6.8 7.4  NEUTROABS  --  4.7  HGB 16.0 15.0  HCT 44.8 43.8  MCV 98.7 99.5  PLT 273 262    Cardiac Enzymes:  Recent Labs Lab 09/14/13 0447  TROPONINI <0.30  BNP: No components found with this basename: POCBNP,   CBG: No results found for this basename: GLUCAP,  in the last 168 hours  Microbiology: Results for orders placed during the hospital encounter of 08/13/13  MRSA PCR SCREENING     Status: None   Collection Time    08/14/13 10:39 AM      Result Value Ref Range Status   MRSA by PCR NEGATIVE  NEGATIVE Final   Comment:            The GeneXpert MRSA Assay (FDA     approved for NASAL specimens     only), is one component of a     comprehensive MRSA colonization     surveillance program. It is not     intended to diagnose MRSA     infection nor to guide or     monitor treatment for     MRSA infections.    Coagulation Studies: No results found for this basename: LABPROT, INR,  in the last 72 hours  Urinalysis:  Recent Labs Lab 09/14/13 0106  COLORURINE YELLOW  LABSPEC 1.029  PHURINE 5.0  GLUCOSEU >1000*  HGBUR NEGATIVE  BILIRUBINUR NEGATIVE  KETONESUR 15*  PROTEINUR 100*  UROBILINOGEN 0.2  NITRITE NEGATIVE  LEUKOCYTESUR NEGATIVE    Lipid Panel:  No results found for this basename: chol, trig, hdl, cholhdl, vldl, ldlcalc    HgbA1C:  Lab Results  Component Value Date   HGBA1C 7.7* 08/14/2013    Urine Drug Screen:   No results found for this basename: labopia, cocainscrnur,  labbenz, amphetmu, thcu, labbarb    Alcohol Level: No results found for this basename: ETH,  in the last 168 hours  Other results: EKG: sinus rhythm, 1st degree AV block at 88 bpm.  Imaging: Dg Chest 1 View  09/13/2013   CLINICAL DATA:  Weakness and fatigue.  EXAM: CHEST - 1 VIEW  COMPARISON:  DG CHEST 2 VIEW dated 08/13/2013  FINDINGS: Mediastinum and hilar structures are normal. Lungs are clear. Mild cardiomegaly, normal pulmonary vascularity. No pleural effusion or pneumothorax. No acute bony abnormality.  IMPRESSION: Stable mild cardiomegaly, no acute cardiopulmonary disease.   Electronically Signed   By: Marcello Moores  Register   On: 09/13/2013 18:07   Dg Abd 1 View  09/13/2013   CLINICAL DATA:  Upper mid abdominal pain, fatigue and nausea  EXAM: ABDOMEN - 1 VIEW  COMPARISON:  CT scan abdomen/ pelvis 08/14/2013  FINDINGS: No evidence of obstruction. Unremarkable bowel gas pattern. Colonic stool burden within normal limits. No acute osseous abnormality. Surgical changes of prior right total hip arthroplasty. Degenerative spurring noted in the thoracic spine.  IMPRESSION: Negative.   Electronically Signed   By: Jacqulynn Cadet M.D.   On: 09/13/2013 21:00   Ct Head Wo Contrast  09/13/2013   CLINICAL DATA:  Fatigue.  EXAM: CT HEAD WITHOUT CONTRAST  TECHNIQUE: Contiguous axial images were obtained from the base of the skull through the vertex without intravenous contrast.  COMPARISON:  None.  FINDINGS: No mass. No hydrocephalus. No hemorrhage. No acute bony abnormality. Visualized paranasal sinuses and mastoids are clear.  IMPRESSION: No acute abnormality.   Electronically Signed   By: Marcello Moores  Register   On: 09/13/2013 21:02     Assessment/Plan: 68 year old male presenting with fatigue and weakness for the past 3 weeks.  It does not seem from the history that the patient's symptoms have been progressive but that he just has not improved.  Would not suspect GBS  at this time. There is no weakness or  sensory complaints on examination that are consistent.  Patient does have a diabetic PN and therefore likely has no lower extremity DTR's at baseline.  Symptoms now also present for three weeks and would likely not require treatment even if GBS were the case since he has not progressed.  This may be more related to the fact that the patient is still feeling poorly from his previous admission and is not eating.  This may have caused some progression of his PN. Head CT reviewed and shows no acute changes.  Recommendations: 1.  PT 2.  Will continue to follow with you  Alexis Goodell, MD [facility] [phone] 09/14/2013, 6:18 AM

## 2013-09-14 NOTE — ED Notes (Signed)
Called Dr. Chyrl Civatte about patient's temperature of 100.1.  Reviewed complaints of pain in upper abdomen only when ingesting food.  Informed that patient was going to be transferred to the floor soon, MD acknowledges and will enter further orders.  Updated receiving nurse Claiborne Billings, on the plan of care.

## 2013-09-14 NOTE — ED Notes (Signed)
Discussed plan of care with Dr. Jodi Mourning, and patient's refusal of Norco for management.

## 2013-09-14 NOTE — Progress Notes (Signed)
Inpatient Diabetes Program Recommendations  AACE/ADA: New Consensus Statement on Inpatient Glycemic Control (2013)  Target Ranges:  Prepandial:   less than 140 mg/dL      Peak postprandial:   less than 180 mg/dL (1-2 hours)      Critically ill patients:  140 - 180 mg/dL     Results for David [name], David [name] (MRN [id number]) as of 09/14/2013 08:41  Ref. Range 09/14/2013 07:42  Glucose-Capillary Latest Range: 70-99 mg/dL 251 (H)    Admit w/ Weakness, Abdominal pain.  Has history of DM, HTN.  Home DM Meds: Wilder Glade (dapagliflozin) 10 mg daily + Levemir 40 units QHS + Novolog 10 units tid with meals   Note patient has been started on the following meds: Levemir 40 units QHS Novolog Sensitive SSI  Dapagliflozin 10 mg daily   MD- Please d/c order for Dapagliflozin 10 mg daily for now and manage patient on insulin only for now   Will follow Wyn Quaker RN, MSN, CDE Diabetes Coordinator Inpatient Diabetes Program Team Pager: [phone] (8a-10p)

## 2013-09-14 NOTE — ED Notes (Signed)
During orthostatic vital signs, pt had difficulty coming to sitting position and even more difficulty coming to a standing position. He could barely stand. Pt stated he was very tired. Family stated this was  far from his baseline and unusual.

## 2013-09-15 ENCOUNTER — Inpatient Hospital Stay (HOSPITAL_COMMUNITY): Payer: BC Managed Care – PPO

## 2013-09-15 DIAGNOSIS — R29898 Other symptoms and signs involving the musculoskeletal system: Secondary | ICD-10-CM | POA: Diagnosis present

## 2013-09-15 DIAGNOSIS — K2990 Gastroduodenitis, unspecified, without bleeding: Secondary | ICD-10-CM

## 2013-09-15 DIAGNOSIS — G609 Hereditary and idiopathic neuropathy, unspecified: Secondary | ICD-10-CM | POA: Diagnosis present

## 2013-09-15 DIAGNOSIS — K2970 Gastritis, unspecified, without bleeding: Secondary | ICD-10-CM

## 2013-09-15 DIAGNOSIS — K5900 Constipation, unspecified: Secondary | ICD-10-CM | POA: Diagnosis present

## 2013-09-15 DIAGNOSIS — N179 Acute kidney failure, unspecified: Secondary | ICD-10-CM | POA: Diagnosis present

## 2013-09-15 DIAGNOSIS — G629 Polyneuropathy, unspecified: Secondary | ICD-10-CM | POA: Diagnosis present

## 2013-09-15 LAB — VITAMIN B12: Vitamin B-12: 742 pg/mL (ref 211–911)

## 2013-09-15 LAB — COMPREHENSIVE METABOLIC PANEL
ALT: 13 U/L (ref 0–53)
AST: 11 U/L (ref 0–37)
Albumin: 3.3 g/dL — ABNORMAL LOW (ref 3.5–5.2)
Alkaline Phosphatase: 47 U/L (ref 39–117)
BUN: 47 mg/dL — ABNORMAL HIGH (ref 6–23)
CO2: 25 mEq/L (ref 19–32)
CREATININE: 2.52 mg/dL — AB (ref 0.50–1.35)
Calcium: 8.7 mg/dL (ref 8.4–10.5)
Chloride: 93 mEq/L — ABNORMAL LOW (ref 96–112)
GFR calc Af Amer: 29 mL/min — ABNORMAL LOW (ref >90)
GFR calc non Af Amer: 25 mL/min — ABNORMAL LOW (ref >90)
Glucose, Bld: 221 mg/dL — ABNORMAL HIGH (ref 70–99)
Potassium: 3.9 mEq/L (ref 3.7–5.3)
SODIUM: 133 meq/L — AB (ref 137–147)
Total Bilirubin: 0.7 mg/dL (ref 0.3–1.2)
Total Protein: 6.7 g/dL (ref 6.0–8.3)

## 2013-09-15 LAB — CALCIUM, URINE, RANDOM: Calcium, Ur: 2 mg/dL

## 2013-09-15 LAB — CBC
HEMATOCRIT: 38.8 % — AB (ref 39.0–52.0)
HEMOGLOBIN: 13.2 g/dL (ref 13.0–17.0)
MCH: 34.2 pg — AB (ref 26.0–34.0)
MCHC: 34 g/dL (ref 30.0–36.0)
MCV: 100.5 fL — ABNORMAL HIGH (ref 78.0–100.0)
Platelets: 228 10*3/uL (ref 150–400)
RBC: 3.86 MIL/uL — AB (ref 4.22–5.81)
RDW: 12.4 % (ref 11.5–15.5)
WBC: 8.2 10*3/uL (ref 4.0–10.5)

## 2013-09-15 LAB — GLUCOSE, CAPILLARY
GLUCOSE-CAPILLARY: 224 mg/dL — AB (ref 70–99)
Glucose-Capillary: 225 mg/dL — ABNORMAL HIGH (ref 70–99)
Glucose-Capillary: 268 mg/dL — ABNORMAL HIGH (ref 70–99)
Glucose-Capillary: 192 mg/dL — ABNORMAL HIGH (ref 70–99)

## 2013-09-15 LAB — SODIUM, URINE, RANDOM: Sodium, Ur: 20 mEq/L

## 2013-09-15 LAB — SEDIMENTATION RATE: SED RATE: 22 mm/h — AB (ref 0–16)

## 2013-09-15 LAB — MAGNESIUM: Magnesium: 2.3 mg/dL (ref 1.5–2.5)

## 2013-09-15 LAB — RPR

## 2013-09-15 LAB — CREATININE, URINE, RANDOM: Creatinine, Urine: 235.26 mg/dL

## 2013-09-15 LAB — PROTEIN, URINE, RANDOM: TOTAL PROTEIN, URINE: 18.1 mg/dL

## 2013-09-15 LAB — HIV ANTIBODY (ROUTINE TESTING W REFLEX): HIV 1&2 Ab, 4th Generation: NONREACTIVE

## 2013-09-15 LAB — PHOSPHORUS: PHOSPHORUS: 5.9 mg/dL — AB (ref 2.3–4.6)

## 2013-09-15 MED ORDER — PANTOPRAZOLE SODIUM 40 MG PO TBEC
40.0000 mg | DELAYED_RELEASE_TABLET | Freq: Every day | ORAL | Status: DC
  Administered 2013-09-15 – 2013-09-16 (×2): 40 mg via ORAL
  Filled 2013-09-15 (×2): qty 1

## 2013-09-15 MED ORDER — SODIUM CHLORIDE 0.9 % IV BOLUS (SEPSIS)
500.0000 mL | Freq: Once | INTRAVENOUS | Status: AC
  Administered 2013-09-15: 500 mL via INTRAVENOUS

## 2013-09-15 MED ORDER — INSULIN DETEMIR 100 UNIT/ML ~~LOC~~ SOLN
44.0000 [IU] | Freq: Every day | SUBCUTANEOUS | Status: DC
  Administered 2013-09-15 – 2013-09-16 (×2): 44 [IU] via SUBCUTANEOUS
  Filled 2013-09-15 (×3): qty 0.44

## 2013-09-15 MED ORDER — LEVOFLOXACIN IN D5W 750 MG/150ML IV SOLN
750.0000 mg | INTRAVENOUS | Status: DC
  Filled 2013-09-15: qty 150

## 2013-09-15 MED ORDER — SODIUM CHLORIDE 0.9 % IV SOLN
INTRAVENOUS | Status: AC
  Administered 2013-09-15 – 2013-09-16 (×3): via INTRAVENOUS

## 2013-09-15 MED ORDER — LACTULOSE 10 GM/15ML PO SOLN
20.0000 g | Freq: Three times a day (TID) | ORAL | Status: AC
  Administered 2013-09-15: 20 g via ORAL
  Filled 2013-09-15 (×3): qty 30

## 2013-09-15 MED ORDER — HEPARIN SODIUM (PORCINE) 5000 UNIT/ML IJ SOLN
5000.0000 [IU] | Freq: Three times a day (TID) | INTRAMUSCULAR | Status: DC
  Administered 2013-09-15 – 2013-09-17 (×6): 5000 [IU] via SUBCUTANEOUS
  Filled 2013-09-15 (×9): qty 1

## 2013-09-15 MED ORDER — IRBESARTAN 150 MG PO TABS
150.0000 mg | ORAL_TABLET | Freq: Every day | ORAL | Status: DC
  Filled 2013-09-15: qty 1

## 2013-09-15 MED ORDER — PREGABALIN 50 MG PO CAPS
50.0000 mg | ORAL_CAPSULE | Freq: Four times a day (QID) | ORAL | Status: DC
  Administered 2013-09-15 – 2013-09-16 (×5): 50 mg via ORAL
  Filled 2013-09-15 (×5): qty 1

## 2013-09-15 NOTE — Progress Notes (Signed)
Bladder scanned for 430cc, pt states he has not had the urge to void any this am, but can try to void now, pt voided 360cc light amber uine.

## 2013-09-15 NOTE — Progress Notes (Signed)
Subjective: Strength of extremities has improved. Patient is ambulating with a cane. Intake of food and fluid improved. He had no new complaints.  Objective: Current vital signs: BP 117/65  Pulse 67  Temp(Src) 97.4 F (36.3 C) (Oral)  Resp 18  Ht 6' 2.5" (1.892 m)  Wt 101.061 kg (222 lb 12.8 oz)  BMI 28.23 kg/m2  SpO2 97%  Neurologic Exam: Alert and in no acute distress. Mental status was normal. No facial weakness was noted. Speech was normal. Strength of upper extremities was normal proximally and distally, including intrinsic hand muscles. Strength of lower extremities was normal except for minimal tibialis anterior weakness bilaterally. Coordination was normal.  Medications: I have reviewed the patient's current medications.  Assessment/Plan: Progressive lower extremity weakness most likely secondary to deconditioning associated with nutritional and fluid intake, exacerbating underlying diabetes, including neuropathy. Patient is markedly improved since admission. There are no clinical signs of GBS. He feels he is very close to baseline with respect to strength and stamina.  Recommend no changes in current management. No further neurological intervention is warranted. I will see him on an as-needed basis following this visit.  C.R. Nicole Kindred, MD [facility] [phone]  09/15/2013  12:30 PM

## 2013-09-15 NOTE — Progress Notes (Signed)
Physical Therapy Treatment Patient Details Name: David [name] MRN: [id number] DOB: [birth year] Today's Date: 09/15/2013    History of Present Illness David [name] is a 68 y.o. male with history of diabetes mellitus, hypertension, chronic back pain who was recently admitted for nausea vomiting and abdominal pain 4  weeks ago presents to the ER with complaints of increasing weakness and fatigue and abdominal pain.    PT Comments    Pt ambulated ~200' starting with use of quad cane but then transitioned to RW to allow him to offload weight from painful Lt LE & to provide increased stability.  Pt reports feeling more comfortable with use of RW vs cane at this time.  Encouraged pt to ambulate with Nsing daily.     Follow Up Recommendations  Outpatient PT     Equipment Recommendations  Rolling walker with 5" wheels    Recommendations for Other Services       Precautions / Restrictions Precautions Precautions: Fall Precaution Comments: 4 falls in past month     Mobility  Bed Mobility Overal bed mobility: Modified Independent                Transfers Overall transfer level: Modified independent Equipment used: Quad cane;Rolling walker (2 wheeled)                Ambulation/Gait Ambulation/Gait assistance: Min guard Ambulation Distance (Feet): 200 Feet Assistive device: Quad cane;Rolling walker (2 wheeled) Gait Pattern/deviations: Step-through pattern;Decreased stride length;Trunk flexed Gait velocity: decreased   General Gait Details: Pt began ambulating with quad cane but then switched to RW due to Lt hip pain.  Used RW to allow for offloading of weight from Lt LE.  Pt states he feels more comfortable using RW.     Stairs Stairs: Yes Stairs assistance: Min guard Stair Management: Two rails;Step to pattern;Forwards Number of Stairs: 10    Wheelchair Mobility    Modified Rankin (Stroke Patients Only)       Balance                                    Cognition Arousal/Alertness: Awake/alert Behavior During Therapy: WFL for tasks assessed/performed Overall Cognitive Status: Within Functional Limits for tasks assessed                      Exercises      General Comments        Pertinent Vitals/Pain 8/10 Lt hip due to bursitis per pt.  RN notified for pain medication.      Home Living                      Prior Function            PT Goals (current goals can now be found in the care plan section) Acute Rehab PT Goals Patient Stated Goal: To improve strength PT Goal Formulation: With patient Time For Goal Achievement: 09/21/13 Potential to Achieve Goals: Good    Frequency  Min 3X/week    PT Plan Current plan remains appropriate    Co-evaluation             End of Session Equipment Utilized During Treatment: Gait belt Activity Tolerance: Patient tolerated treatment well;Patient limited by pain Patient left: in chair;with call bell/phone within reach;with family/visitor present     Time: [id number] PT Time Calculation (min): 42 min  Charges:  $Gait Training: 23-37 mins $Therapeutic Activity: 8-22 mins                      Sena Hitch 09/15/2013, 12:37 PM  Sarajane Marek, PTA [phone] 09/15/2013

## 2013-09-15 NOTE — Progress Notes (Signed)
[facility] PROGRESS NOTE  ARMOR BAUM [id number] DOB: November 05, 1945 DOA: 09/13/2013 PCP: Leola Brazil, MD  Brief narrative [facility]. David [name] is a 68 y.o. male with diabetes mellitus, hypertension, chronic back pain, and peripheral neuropathy who was recently admitted for nausea, vomiting and abdominal pain, 4 weeks ago, with CT abdomen and pelvis on 08/14/13 showing  "1. Possible cystitis. Correlate with urinalysis. 2. Otherwise, no evidence of acute intra-abdominal disease" who presented to the ER with complaints of increasing weakness and fatigue and abdominal pain. The cause of the weakness is not entirely clear at this point in time.there was consideration for Gullen Barre syndrome. Patient was seen by Neurology, who were less impressed about the possibility of GBS. Appreciate help. It appears the patient has ongoing infection with question of urinary obstruction. He has been hypotensive with worsening renal function. However, patient has been on diuretics and ACE inhibitors which have not helped in setting of renal insufficiency. His sedatives including Lyrica seem to be too large doses especially in setting of renal insufficiency and will need adjustment. The may also be contributing to the weakness. Patient and wife mention multiple problems including constipation and not voiding since last night. I believe part of his weakness is related to hypotension. Will discontinue ACE inhibitor/diuretic, and give IV fluids and continue current antibiotics(adjusted for crcl) and physical therapy. Will also obtain renal ultrasound and random urine lytes in view of worsening renal failure. Patient states that he feels somewhat stronger today. The question is what is the underlying problem causing the constipation and apparent urinary obstruction. Plan Weakness/Chronic back pain/Abdominal pain/ Unspecified constipation/Unspecified hereditary and idiopathic peripheral  neuropathy  Underlying cause of his constellation of symptoms not entirely clear  Decrease Lyrica dose  Follow Neurology recommendations Hypertension, uncontrolled  BP now low  Hold ACE inhibitor/diuretic  IV fluids Diabetes mellitus  Uncontrolled  Adjust insulin dose to optimize control. AKI (acute kidney injury)  Likely related to hypotension and/or post obstruction  Renal ultrasound  Avoid nephrotoxic medications  IV fluids DVT/GI prophylaxis  Heparin subcutaneous  Protonix Code Status: Full Code. Family Communication: Wife at bedside. Disposition Plan: Eventually home   Consultants: Neurology Procedures:  None  Antibiotics:  Levaquin 09/14/13>  HPI/Subjective: Feels a little better but is constipated. Wife says belly is more distended than before  Objective: Filed Vitals:   09/15/13 0500  BP: 94/55  Pulse: 67  Temp: 97.4 F (36.3 C)  Resp: 18    Intake/Output Summary (Last 24 hours) at 09/15/13 1031 Last data filed at 09/14/13 2211  Gross per 24 hour  Intake    495 ml  Output    400 ml  Net     95 ml   Filed Weights   09/13/13 1642 09/14/13 0220  Weight: 99.791 kg (220 lb) 101.061 kg (222 lb 12.8 oz)    Exam:   General:  Sitting up no acute distress  Cardiovascular: S1-S2 normal. RRR. No murmurs.  Respiratory: Lungs clear  Abdomen:  Some abdominal fullness with no localized tenderness or organomegaly. Positive bowel sounds.  Musculoskeletal: No pedal edema.   Data Reviewed: Basic Metabolic Panel:  Recent Labs Lab 09/13/13 1655 09/14/13 0447 09/15/13 0754  NA 137 133* 133*  K 4.0 4.1 3.9  CL 95* 92* 93*  CO2 27 24 25   GLUCOSE 230* 272* 221*  BUN 19 25* 47*  CREATININE 0.89 0.97 2.52*  CALCIUM 9.9 9.3 8.7  MG  --   --  2.3  PHOS  --   --  5.9*   Liver Function Tests:  Recent Labs Lab 09/14/13 0447 09/15/13 0754  AST 23 11  ALT 19 13  ALKPHOS 52 47  BILITOT 1.0 0.7  PROT 8.1 6.7  ALBUMIN 3.9 3.3*     Recent Labs Lab 09/14/13 0447  LIPASE 17   No results found for this basename: AMMONIA,  in the last 168 hours CBC:  Recent Labs Lab 09/13/13 1655 09/14/13 0447 09/15/13 0555  WBC 6.8 7.4 8.2  NEUTROABS  --  4.7  --   HGB 16.0 15.0 13.2  HCT 44.8 43.8 38.8*  MCV 98.7 99.5 100.5*  PLT 273 262 228   Cardiac Enzymes:  Recent Labs Lab 09/14/13 0447  CKTOTAL 126  TROPONINI <0.30   BNP (last 3 results) No results found for this basename: PROBNP,  in the last 8760 hours CBG:  Recent Labs Lab 09/14/13 0742 09/14/13 1129 09/14/13 1613 09/14/13 2043 09/15/13 0756  GLUCAP 251* 303* 218* 208* 225*    Recent Results (from the past 240 hour(s))  CULTURE, BLOOD (ROUTINE X 2)     Status: None   Collection Time    09/14/13  3:17 AM      Result Value Ref Range Status   Specimen Description BLOOD RIGHT ANTECUBITAL   Final   Special Requests BOTTLES DRAWN AEROBIC ONLY 10CC   Final   Culture  Setup Time     Final   Value: 09/14/2013 08:00     Performed at [facility]   Culture     Final   Value:        BLOOD CULTURE RECEIVED NO GROWTH TO DATE CULTURE WILL BE HELD FOR 5 DAYS BEFORE ISSUING A FINAL NEGATIVE REPORT     Performed at [facility]   Report Status PENDING   Incomplete  CULTURE, BLOOD (ROUTINE X 2)     Status: None   Collection Time    09/14/13  3:25 AM      Result Value Ref Range Status   Specimen Description BLOOD RIGHT FOREARM   Final   Special Requests BOTTLES DRAWN AEROBIC ONLY 10CC   Final   Culture  Setup Time     Final   Value: 09/14/2013 08:00     Performed at [facility]   Culture     Final   Value:        BLOOD CULTURE RECEIVED NO GROWTH TO DATE CULTURE WILL BE HELD FOR 5 DAYS BEFORE ISSUING A FINAL NEGATIVE REPORT     Performed at [facility]   Report Status PENDING   Incomplete     Studies: Dg Chest 1 View  09/13/2013   CLINICAL DATA:  Weakness and fatigue.  EXAM: CHEST - 1 VIEW  COMPARISON:  DG CHEST 2  VIEW dated 08/13/2013  FINDINGS: Mediastinum and hilar structures are normal. Lungs are clear. Mild cardiomegaly, normal pulmonary vascularity. No pleural effusion or pneumothorax. No acute bony abnormality.  IMPRESSION: Stable mild cardiomegaly, no acute cardiopulmonary disease.   Electronically Signed   By: Marcello Moores  Register   On: 09/13/2013 18:07   Dg Abd 1 View  09/13/2013   CLINICAL DATA:  Upper mid abdominal pain, fatigue and nausea  EXAM: ABDOMEN - 1 VIEW  COMPARISON:  CT scan abdomen/ pelvis 08/14/2013  FINDINGS: No evidence of obstruction. Unremarkable bowel gas pattern. Colonic stool burden within normal limits. No acute osseous abnormality. Surgical changes of prior right total hip arthroplasty.  Degenerative spurring noted in the thoracic spine.  IMPRESSION: Negative.   Electronically Signed   By: Jacqulynn Cadet M.D.   On: 09/13/2013 21:00   Ct Head Wo Contrast  09/13/2013   CLINICAL DATA:  Fatigue.  EXAM: CT HEAD WITHOUT CONTRAST  TECHNIQUE: Contiguous axial images were obtained from the base of the skull through the vertex without intravenous contrast.  COMPARISON:  None.  FINDINGS: No mass. No hydrocephalus. No hemorrhage. No acute bony abnormality. Visualized paranasal sinuses and mastoids are clear.  IMPRESSION: No acute abnormality.   Electronically Signed   By: Marcello Moores  Register   On: 09/13/2013 21:02   Dg Chest Port 1 View  09/14/2013   CLINICAL DATA:  FEVER.  EXAM: PORTABLE CHEST - 1 VIEW  COMPARISON:  DG CHEST 1 VIEW dated 09/13/2013  FINDINGS: Low lung volumes. Cardiac silhouette is mild-to-moderately enlarged. With technique taken into consideration there is mild prominence of interstitial markings and mild peribronchial prominence. No focal regions of consolidation or focal infiltrates. No acute osseous abnormalities are appreciated.  IMPRESSION: Pulmonary vascular congestion.  No focal regions of consolidation or focal infiltrates.   Electronically Signed   By: Margaree Mackintosh M.D.    On: 09/14/2013 08:09    Scheduled Meds: . amLODipine  10 mg Oral Daily  . aspirin EC  81 mg Oral Daily  . Dapagliflozin Propanediol  10 mg Oral Daily  . docusate sodium  200 mg Oral BID  . insulin aspart  0-9 Units Subcutaneous TID WC  . insulin detemir  44 Units Subcutaneous QHS  . lactulose  20 g Oral TID  . latanoprost  1 drop Both Eyes QHS  . [START ON 09/16/2013] levofloxacin (LEVAQUIN) IV  750 mg Intravenous Q48H  . oxyCODONE  20 mg Oral BID  . pantoprazole  40 mg Oral QHS  . pregabalin  50 mg Oral QID  . sodium chloride  500 mL Intravenous Once   Continuous Infusions: . sodium chloride 10 mL/hr at 09/14/13 0525  . sodium chloride          Sharonne Ricketts  [facility] Pager [phone]. If 7PM-7AM, please contact night-coverage at [email or web address], password [facility] 09/15/2013, 10:31 AM  LOS: 2 days

## 2013-09-16 LAB — CEA: CEA: 0.8 ng/mL (ref 0.0–5.0)

## 2013-09-16 LAB — BASIC METABOLIC PANEL
BUN: 30 mg/dL — AB (ref 6–23)
CALCIUM: 8.8 mg/dL (ref 8.4–10.5)
CO2: 25 mEq/L (ref 19–32)
CREATININE: 1.28 mg/dL (ref 0.50–1.35)
Chloride: 102 mEq/L (ref 96–112)
GFR calc non Af Amer: 57 mL/min — ABNORMAL LOW (ref >90)
GFR, EST AFRICAN AMERICAN: 66 mL/min — AB (ref >90)
GLUCOSE: 206 mg/dL — AB (ref 70–99)
Potassium: 4.4 mEq/L (ref 3.7–5.3)
Sodium: 140 mEq/L (ref 137–147)

## 2013-09-16 LAB — CBC
HEMATOCRIT: 38.8 % — AB (ref 39.0–52.0)
HEMOGLOBIN: 13.2 g/dL (ref 13.0–17.0)
MCH: 34.7 pg — AB (ref 26.0–34.0)
MCHC: 34 g/dL (ref 30.0–36.0)
MCV: 102.1 fL — AB (ref 78.0–100.0)
Platelets: 255 10*3/uL (ref 150–400)
RBC: 3.8 MIL/uL — ABNORMAL LOW (ref 4.22–5.81)
RDW: 12.4 % (ref 11.5–15.5)
WBC: 5.3 10*3/uL (ref 4.0–10.5)

## 2013-09-16 LAB — GLUCOSE, CAPILLARY
GLUCOSE-CAPILLARY: 171 mg/dL — AB (ref 70–99)
Glucose-Capillary: 209 mg/dL — ABNORMAL HIGH (ref 70–99)
Glucose-Capillary: 267 mg/dL — ABNORMAL HIGH (ref 70–99)
Glucose-Capillary: 185 mg/dL — ABNORMAL HIGH (ref 70–99)

## 2013-09-16 LAB — ANA: Anti Nuclear Antibody(ANA): NEGATIVE

## 2013-09-16 LAB — PSA: PSA: 1.51 ng/mL (ref <=4.00)

## 2013-09-16 MED ORDER — PREGABALIN 50 MG PO CAPS
50.0000 mg | ORAL_CAPSULE | Freq: Three times a day (TID) | ORAL | Status: DC
  Administered 2013-09-16 – 2013-09-17 (×2): 50 mg via ORAL
  Filled 2013-09-16 (×2): qty 1

## 2013-09-16 MED ORDER — LEVOFLOXACIN 750 MG PO TABS
750.0000 mg | ORAL_TABLET | ORAL | Status: DC
  Administered 2013-09-16: 750 mg via ORAL
  Filled 2013-09-16: qty 1

## 2013-09-16 NOTE — Progress Notes (Signed)
[facility] PROGRESS NOTE  David [name] [id number] DOB: 17-Jan-1946 DOA: 09/13/2013 PCP: Leola Brazil, MD  Brief narrative  [facility]. David [name] is a 68 y.o. male with diabetes mellitus, hypertension, chronic back pain, and peripheral neuropathy who was recently admitted for nausea, vomiting and abdominal pain, 4 weeks ago, with CT abdomen and pelvis on 08/14/13 showing "1. Possible cystitis. Correlate with urinalysis. 2. Otherwise, no evidence of acute intra-abdominal disease" who presented to the ER with complaints of increasing weakness and fatigue and abdominal pain. The cause of the weakness is not entirely clear at this point in time.there was consideration for Gullen Barre syndrome. Patient was seen by Neurology, who were less impressed about the possibility of GBS. Appreciate help. It appears the patient has ongoing infection with question of urinary obstruction. He has been hypotensive with worsening renal function. However, patient has been on diuretics and ACE inhibitors which have not helped in setting of renal insufficiency. His sedatives, including Lyrica, Robaxin seem to be too large doses especially in setting of renal insufficiency and will need adjustment. The may also be contributing to the weakness. Patient and wife mention multiple problems including constipation and not voiding since last night. I believe part of his weakness is related to hypotension. Discontinued ACE inhibitor/diuretic on 09/15/13, and started IV fluids. I will discontinue Robaxin. Continue current antibiotics(adjusted for crcl) and physical therapy. Renal ultrasound was normal.  Patient states that he feels much better today. Will likely discharge in a.m. if he continues to do better, especially if renal function is improving. Plan  Weakness/Chronic back pain/Abdominal pain/ Unspecified constipation/Unspecified hereditary and idiopathic peripheral neuropathy  Underlying cause  of his constellation of symptoms not entirely clear  Decrease Lyrica frequency to 3 times a day Discontinue Robaxin  Follow Neurology recommendations Hypertension, uncontrolled  BP more stable Hold ACE inhibitor/diuretic  IV fluids Diabetes mellitus  Uncontrolled  Adjust insulin dose to optimize control. AKI (acute kidney injury)  Likely related to hypotension and/or post obstruction  Renal ultrasound normal Avoid nephrotoxic medications  IV fluids Repeat BMP today DVT/GI prophylaxis  Heparin subcutaneous Protonix Code Status: Full Code.  Family Communication: Wife at bedside.  Disposition Plan: Eventually home  Consultants:  Neurology  Procedures:  None Antibiotics:  Levaquin 09/14/13>  HPI/Subjective: Feels better. No complaints.  Objective: Filed Vitals:   09/16/13 1410  BP: 131/78  Pulse: 87  Temp: 98.1 F (36.7 C)  Resp: 18    Intake/Output Summary (Last 24 hours) at 09/16/13 1717 Last data filed at 09/16/13 1416  Gross per 24 hour  Intake    600 ml  Output   3700 ml  Net  -3100 ml   Filed Weights   09/13/13 1642 09/14/13 0220 09/16/13 0443  Weight: 99.791 kg (220 lb) 101.061 kg (222 lb 12.8 oz) 103.511 kg (228 lb 3.2 oz)    Exam:   General:  Comfortable at rest  Cardiovascular: First,second heart sounds heard, normal. RRR.  Respiratory: Lungs clear  Abdomen: Soft and nontender. Normal bowel sounds. No palpable organomegaly.  Musculoskeletal: No pedal edema.  Data Reviewed: Basic Metabolic Panel:  Recent Labs Lab 09/13/13 1655 09/14/13 0447 09/15/13 0754  NA 137 133* 133*  K 4.0 4.1 3.9  CL 95* 92* 93*  CO2 27 24 25   GLUCOSE 230* 272* 221*  BUN 19 25* 47*  CREATININE 0.89 0.97 2.52*  CALCIUM 9.9 9.3 8.7  MG  --   --  2.3  PHOS  --   --  5.9*   Liver Function Tests:  Recent Labs Lab 09/14/13 0447 09/15/13 0754  AST 23 11  ALT 19 13  ALKPHOS 52 47  BILITOT 1.0 0.7  PROT 8.1 6.7  ALBUMIN 3.9 3.3*    Recent Labs Lab  09/14/13 0447  LIPASE 17   No results found for this basename: AMMONIA,  in the last 168 hours CBC:  Recent Labs Lab 09/13/13 1655 09/14/13 0447 09/15/13 0555  WBC 6.8 7.4 8.2  NEUTROABS  --  4.7  --   HGB 16.0 15.0 13.2  HCT 44.8 43.8 38.8*  MCV 98.7 99.5 100.5*  PLT 273 262 228   Cardiac Enzymes:  Recent Labs Lab 09/14/13 0447  CKTOTAL 126  TROPONINI <0.30   BNP (last 3 results) No results found for this basename: PROBNP,  in the last 8760 hours CBG:  Recent Labs Lab 09/15/13 1634 09/15/13 2119 09/16/13 0739 09/16/13 1149 09/16/13 1622  GLUCAP 192* 224* 171* 209* 267*    Recent Results (from the past 240 hour(s))  CULTURE, BLOOD (ROUTINE X 2)     Status: None   Collection Time    09/14/13  3:17 AM      Result Value Ref Range Status   Specimen Description BLOOD RIGHT ANTECUBITAL   Final   Special Requests BOTTLES DRAWN AEROBIC ONLY 10CC   Final   Culture  Setup Time     Final   Value: 09/14/2013 08:00     Performed at [facility]   Culture     Final   Value:        BLOOD CULTURE RECEIVED NO GROWTH TO DATE CULTURE WILL BE HELD FOR 5 DAYS BEFORE ISSUING A FINAL NEGATIVE REPORT     Performed at [facility]   Report Status PENDING   Incomplete  CULTURE, BLOOD (ROUTINE X 2)     Status: None   Collection Time    09/14/13  3:25 AM      Result Value Ref Range Status   Specimen Description BLOOD RIGHT FOREARM   Final   Special Requests BOTTLES DRAWN AEROBIC ONLY 10CC   Final   Culture  Setup Time     Final   Value: 09/14/2013 08:00     Performed at [facility]   Culture     Final   Value:        BLOOD CULTURE RECEIVED NO GROWTH TO DATE CULTURE WILL BE HELD FOR 5 DAYS BEFORE ISSUING A FINAL NEGATIVE REPORT     Performed at [facility]   Report Status PENDING   Incomplete     Studies: US Renal  09/15/2013   CLINICAL DATA:  Acute renal failure.  EXAM: RENAL/URINARY TRACT ULTRASOUND COMPLETE  COMPARISON:  None.  FINDINGS:  Right Kidney:  Length: 12.4 cm. Echogenicity within normal limits. No mass or hydronephrosis visualized.  Left Kidney:  Length: 11.9 cm. Echogenicity within normal limits. No mass or hydronephrosis visualized.  Bladder:  The bladder is empty.  IMPRESSION: Normal exam.   Electronically Signed   By: Rozetta Nunnery M.D.   On: 09/15/2013 20:14    Scheduled Meds: . amLODipine  10 mg Oral Daily  . aspirin EC  81 mg Oral Daily  . Dapagliflozin Propanediol  10 mg Oral Daily  . docusate sodium  200 mg Oral BID  . heparin subcutaneous  5,000 Units Subcutaneous 3 times per day  . insulin aspart  0-9 Units Subcutaneous TID WC  . insulin detemir  44 Units Subcutaneous QHS  . latanoprost  1 drop Both Eyes QHS  . levofloxacin  750 mg Oral Q48H  . oxyCODONE  20 mg Oral BID  . pantoprazole  40 mg Oral QHS  . pregabalin  50 mg Oral TID   Continuous Infusions: . sodium chloride Stopped (09/15/13 0905)  . sodium chloride 75 mL/hr at 09/16/13 [place]  [facility] Pager [phone]. If 7PM-7AM, please contact night-coverage at [email or web address], password [facility] 09/16/2013, 5:17 PM  LOS: 3 days

## 2013-09-17 LAB — CBC
HCT: 34.8 % — ABNORMAL LOW (ref 39.0–52.0)
HEMOGLOBIN: 11.6 g/dL — AB (ref 13.0–17.0)
MCH: 33.8 pg (ref 26.0–34.0)
MCHC: 33.3 g/dL (ref 30.0–36.0)
MCV: 101.5 fL — ABNORMAL HIGH (ref 78.0–100.0)
Platelets: 239 10*3/uL (ref 150–400)
RBC: 3.43 MIL/uL — ABNORMAL LOW (ref 4.22–5.81)
RDW: 12.3 % (ref 11.5–15.5)
WBC: 5.9 10*3/uL (ref 4.0–10.5)

## 2013-09-17 LAB — BASIC METABOLIC PANEL
BUN: 28 mg/dL — ABNORMAL HIGH (ref 6–23)
CALCIUM: 8.7 mg/dL (ref 8.4–10.5)
CO2: 22 mEq/L (ref 19–32)
Chloride: 103 mEq/L (ref 96–112)
Creatinine, Ser: 1.19 mg/dL (ref 0.50–1.35)
GFR calc non Af Amer: 62 mL/min — ABNORMAL LOW (ref >90)
GFR, EST AFRICAN AMERICAN: 72 mL/min — AB (ref >90)
GLUCOSE: 178 mg/dL — AB (ref 70–99)
Potassium: 4.2 mEq/L (ref 3.7–5.3)
SODIUM: 136 meq/L — AB (ref 137–147)

## 2013-09-17 LAB — GLUCOSE, CAPILLARY: Glucose-Capillary: 122 mg/dL — ABNORMAL HIGH (ref 70–99)

## 2013-09-17 MED ORDER — LEVOFLOXACIN 750 MG PO TABS: 750.0000 mg | ORAL_TABLET | Freq: Every day | ORAL | Status: DC

## 2013-09-17 MED ORDER — AMLODIPINE BESYLATE 10 MG PO TABS: 10.0000 mg | ORAL_TABLET | Freq: Every day | ORAL | Status: DC

## 2013-09-17 MED ORDER — PREGABALIN 50 MG PO CAPS: 50.0000 mg | ORAL_CAPSULE | Freq: Three times a day (TID) | ORAL | Status: DC

## 2013-09-17 NOTE — Discharge Summary (Signed)
David [name], is a 68 y.o. male  DOB 1945/07/17  MRN [id number].  Admission date:  09/13/2013  Admitting Physician  Rise Patience, MD  Discharge Date:  09/17/2013   Primary MD  Leola Brazil, MD  Recommendations for primary care physician for things to follow:   Please are just sedatives down once as possible   Admission Diagnosis  Gastritis [535.50]   Discharge Diagnosis  Cystitis    Active Problems:   Chronic back pain   Hypertension, uncontrolled   Diabetes mellitus   Unspecified hereditary and idiopathic peripheral neuropathy   History of present illness and  Hospital Course:     Kindly see H&P for history of present illness and admission details, please review complete Labs, Consult reports and Test reports for all details. In brief , David [name] is a pleasant 68 y.o. male with diabetes mellitus, hypertension, chronic back pain, and peripheral neuropathy who was recently admitted for nausea, vomiting and abdominal pain, 4 weeks ago, with CT abdomen and pelvis on 08/14/13 showing "1. Possible cystitis. Correlate with urinalysis. 2. Otherwise, no evidence of acute intra-abdominal disease" who presented to the ER with complaints of increasing weakness and fatigue and abdominal pain. The cause of the weakness was not entirely clear at the time of admission, but there was consideration for Gullen Barre syndrome. Patient was seen by Neurology, who were less impressed about the possibility of GBS. Patient had a CT brain which was negative. It appeared that the patient had ongoing infection with question of urinary obstruction. He had been hypotensive with worsening renal function. However, patient had been on diuretics and ACE inhibitors which did not help in setting of renal insufficiency. His sedatives, including  Lyrica, Robaxin seemed to be too large doses especially in setting of renal insufficiency and need for adjustment. They were contributing to the weakness. Part of his weakness was related to hypotension. Therefore, discontinued ACE inhibitor/diuretic on 09/15/13, and started IV fluids.Also discontinued Robaxin which I don't believe he still needs. Also decreased daily recut dose and frequency to 50 mg 3 times a day. He complained of severe peripheral neuropathy which needs to be addressed long term but I believe at this point he is benefiting from lower doses as he has more energy and feels better than he has done in months. He was given Levaquin to treat possible cystitis. Renal function has improved back to normal with rehydration and stopping ACE inhibitors. Renal ultrasound was normal. He is discharged in stable condition to follow with his PCP of for further adjustment of his BP meds and sedatives.     Past Medical History  Diagnosis Date  . Diabetes mellitus without complication   . Hypertension   . Glaucoma     Past Surgical History  Procedure Laterality Date  . Joint replacement    . Back surgery    . Cyst excision      on Back  . Total hip arthroplasty      Right  Discharge Condition: Stable.   Follow UP  Follow-up Information   Follow up with [facility]. (Or other Gastroenterology provider if your primary doctor prefers.)    Specialty:  Gastroenterology   Contact information:   [address] [place] [address] [phone]         Discharge Instructions  and  Discharge Medications    Discharge Orders   Future Orders Complete By Expires   Diet Carb Modified  As directed    Increase activity slowly  As directed        Medication List    STOP taking these medications       methocarbamol 750 MG tablet  Commonly known as:  ROBAXIN     Oxycodone HCl 20 MG Tabs     TRIBENZOR 40-10-25 MG Tabs  Generic drug:   Olmesartan-Amlodipine-HCTZ      TAKE these medications       ALPRAZolam 1 MG tablet  Commonly known as:  XANAX  Take 1 mg by mouth daily as needed for anxiety.     amLODipine 10 MG tablet  Commonly known as:  NORVASC  Take 1 tablet (10 mg total) by mouth daily.     aspirin EC 81 MG tablet  Take 81 mg by mouth daily.     bimatoprost 0.03 % ophthalmic solution  Commonly known as:  LUMIGAN  Place 1 drop into both eyes at bedtime.     carvedilol 12.5 MG tablet  Commonly known as:  COREG  Take 12.5 mg by mouth 2 (two) times daily with a meal.     CENTRUM SILVER ADULT 50+ Tabs  Take 1 tablet by mouth daily.     EYE DROPS OP  Apply 2 drops to eye 3 (three) times daily as needed (for dry eyes).     FARXIGA 10 MG Tabs  Generic drug:  Dapagliflozin Propanediol  Take 10 mg by mouth daily.     Hydrocodone-Acetaminophen 7.5-300 MG Tabs  Take 1 tablet by mouth 2 (two) times daily as needed (for pain).     LEVEMIR FLEXTOUCH 100 UNIT/ML Pen  Generic drug:  Insulin Detemir  Inject 40 Units into the skin at bedtime.     levofloxacin 750 MG tablet  Commonly known as:  LEVAQUIN  Take 1 tablet (750 mg total) by mouth daily.     NOVOLOG FLEXPEN 100 UNIT/ML FlexPen  Generic drug:  insulin aspart  Inject 10 Units into the skin 3 (three) times daily with meals.     ondansetron 4 MG tablet  Commonly known as:  ZOFRAN  Take 1 tablet (4 mg total) by mouth every 6 (six) hours.     pantoprazole 20 MG tablet  Commonly known as:  PROTONIX  Take 2 tablets (40 mg total) by mouth daily.     pregabalin 50 MG capsule  Commonly known as:  LYRICA  Take 1 capsule (50 mg total) by mouth 3 (three) times daily.          Diet and Activity recommendation: See Discharge Instructions above   Consults obtained -Neurology   Major procedures and Radiology Reports - PLEASE review detailed and final reports for all details, in brief -    Dg Chest 1 View  09/13/2013   CLINICAL DATA:  Weakness  and fatigue.  EXAM: CHEST - 1 VIEW  COMPARISON:  DG CHEST 2 VIEW dated 08/13/2013  FINDINGS: Mediastinum and hilar structures are normal. Lungs are clear. Mild cardiomegaly, normal pulmonary  vascularity. No pleural effusion or pneumothorax. No acute bony abnormality.  IMPRESSION: Stable mild cardiomegaly, no acute cardiopulmonary disease.   Electronically Signed   By: Marcello Moores  Register   On: 09/13/2013 18:07   Dg Abd 1 View  09/13/2013   CLINICAL DATA:  Upper mid abdominal pain, fatigue and nausea  EXAM: ABDOMEN - 1 VIEW  COMPARISON:  CT scan abdomen/ pelvis 08/14/2013  FINDINGS: No evidence of obstruction. Unremarkable bowel gas pattern. Colonic stool burden within normal limits. No acute osseous abnormality. Surgical changes of prior right total hip arthroplasty. Degenerative spurring noted in the thoracic spine.  IMPRESSION: Negative.   Electronically Signed   By: Jacqulynn Cadet M.D.   On: 09/13/2013 21:00   Ct Head Wo Contrast  09/13/2013   CLINICAL DATA:  Fatigue.  EXAM: CT HEAD WITHOUT CONTRAST  TECHNIQUE: Contiguous axial images were obtained from the base of the skull through the vertex without intravenous contrast.  COMPARISON:  None.  FINDINGS: No mass. No hydrocephalus. No hemorrhage. No acute bony abnormality. Visualized paranasal sinuses and mastoids are clear.  IMPRESSION: No acute abnormality.   Electronically Signed   By: Marcello Moores  Register   On: 09/13/2013 21:02   US Renal  09/15/2013   CLINICAL DATA:  Acute renal failure.  EXAM: RENAL/URINARY TRACT ULTRASOUND COMPLETE  COMPARISON:  None.  FINDINGS: Right Kidney:  Length: 12.4 cm. Echogenicity within normal limits. No mass or hydronephrosis visualized.  Left Kidney:  Length: 11.9 cm. Echogenicity within normal limits. No mass or hydronephrosis visualized.  Bladder:  The bladder is empty.  IMPRESSION: Normal exam.   Electronically Signed   By: Rozetta Nunnery M.D.   On: 09/15/2013 20:14   Dg Chest Port 1 View  09/14/2013   CLINICAL DATA:   FEVER.  EXAM: PORTABLE CHEST - 1 VIEW  COMPARISON:  DG CHEST 1 VIEW dated 09/13/2013  FINDINGS: Low lung volumes. Cardiac silhouette is mild-to-moderately enlarged. With technique taken into consideration there is mild prominence of interstitial markings and mild peribronchial prominence. No focal regions of consolidation or focal infiltrates. No acute osseous abnormalities are appreciated.  IMPRESSION: Pulmonary vascular congestion.  No focal regions of consolidation or focal infiltrates.   Electronically Signed   By: Margaree Mackintosh M.D.   On: 09/14/2013 08:09    Micro Results    Recent Results (from the past 240 hour(s))  CULTURE, BLOOD (ROUTINE X 2)     Status: None   Collection Time    09/14/13  3:17 AM      Result Value Ref Range Status   Specimen Description BLOOD RIGHT ANTECUBITAL   Final   Special Requests BOTTLES DRAWN AEROBIC ONLY 10CC   Final   Culture  Setup Time     Final   Value: 09/14/2013 08:00     Performed at [facility]   Culture     Final   Value:        BLOOD CULTURE RECEIVED NO GROWTH TO DATE CULTURE WILL BE HELD FOR 5 DAYS BEFORE ISSUING A FINAL NEGATIVE REPORT     Performed at [facility]   Report Status PENDING   Incomplete  CULTURE, BLOOD (ROUTINE X 2)     Status: None   Collection Time    09/14/13  3:25 AM      Result Value Ref Range Status   Specimen Description BLOOD RIGHT FOREARM   Final   Special Requests BOTTLES DRAWN AEROBIC ONLY 10CC   Final   Culture  Setup  Time     Final   Value: 09/14/2013 08:00     Performed at [facility]   Culture     Final   Value:        BLOOD CULTURE RECEIVED NO GROWTH TO DATE CULTURE WILL BE HELD FOR 5 DAYS BEFORE ISSUING A FINAL NEGATIVE REPORT     Performed at [facility]   Report Status PENDING   Incomplete       Today   Subjective:   Wolfe Rentz today has no headache,no chest abdominal pain,no new weakness tingling or numbness, feels much better wants to go home today.    Objective:   Blood pressure 127/69, pulse 77, temperature 98.5 F (36.9 C), temperature source Oral, resp. rate 18, height 6' 2.5" (1.892 m), weight 105.2 kg (231 lb 14.8 oz), SpO2 97.00%.   Intake/Output Summary (Last 24 hours) at 09/17/13 0741 Last data filed at 09/17/13 0600  Gross per 24 hour  Intake   1665 ml  Output   2650 ml  Net   -985 ml    Exam Awake Alert, Oriented *3, No new F.N deficits, Normal affect [place].AT,PERRAL Supple Neck,No JVD, No cervical lymphadenopathy appriciated.  Symmetrical Chest wall movement, Good air movement bilaterally, CTAB RRR,No Gallops,Rubs or new Murmurs, No Parasternal Heave +ve B.Sounds, Abd Soft, Non tender, No organomegaly appriciated, No rebound -guarding or rigidity. No Cyanosis, Clubbing or edema, No new Rash or bruise  Data Review   CBC w Diff: Lab Results  Component Value Date   WBC 5.9 09/17/2013   HGB 11.6* 09/17/2013   HCT 34.8* 09/17/2013   PLT 239 09/17/2013   LYMPHOPCT 21 09/14/2013   MONOPCT 15* 09/14/2013   EOSPCT 0 09/14/2013   BASOPCT 0 09/14/2013    CMP: Lab Results  Component Value Date   NA 136* 09/17/2013   K 4.2 09/17/2013   CL 103 09/17/2013   CO2 22 09/17/2013   BUN 28* 09/17/2013   CREATININE 1.19 09/17/2013   PROT 6.7 09/15/2013   ALBUMIN 3.3* 09/15/2013   BILITOT 0.7 09/15/2013   ALKPHOS 47 09/15/2013   AST 11 09/15/2013   ALT 13 09/15/2013  .   Total Time in preparing paper work, data evaluation and todays exam - 35 minutes  Nat Math M.D on 09/17/2013 at 7:41 AM  [facility] Office  [phone]

## 2013-09-18 NOTE — Care Management Note (Signed)
    Page 1 of 1   09/18/2013     7:09:14 AM CARE MANAGEMENT NOTE 09/18/2013  Patient:  David [name], David [name]   Account Number:  [id number]  Date Initiated:  09/18/2013  Documentation initiated by:  [facility]  Subjective/Objective Assessment:   adm: Weakness and abdominal pain     Action/Plan:   discharge planning   Anticipated DC Date:  09/17/2013   Anticipated DC Plan:  [place]  CM consult      Choice offered to / List presented to:     DME arranged  Vassie Moselle      DME agency  [place].        Status of service:  Completed, signed off Medicare Important Message given?   (If response is "NO", the following Medicare IM given date fields will be blank) Date Medicare IM given:   Date Additional Medicare IM given:    Discharge Disposition:  HOME/SELF CARE  Per UR Regulation:    If discussed at Long Length of Stay Meetings, dates discussed:    Comments:  09/17/13 11:00 CM received call to arrange Rolling walker to be delivered to room prior to dishcarge.  DME delivery called and RW was delivered to room.  Pt to receive Outpt PT.  No other CM needs were communicated.  Mariane Masters, BSN, CM [phone].

## 2013-09-20 LAB — CULTURE, BLOOD (ROUTINE X 2)
Culture: NO GROWTH
Culture: NO GROWTH

## 2013-10-09 ENCOUNTER — Encounter (HOSPITAL_COMMUNITY): Payer: Self-pay | Admitting: Emergency Medicine

## 2013-10-09 ENCOUNTER — Emergency Department (HOSPITAL_COMMUNITY)
Admission: EM | Admit: 2013-10-09 | Discharge: 2013-10-09 | Disposition: A | Discharge status: Home / Self Care | Payer: BC Managed Care – PPO | Attending: Emergency Medicine | Admitting: Emergency Medicine

## 2013-10-09 ENCOUNTER — Emergency Department (HOSPITAL_COMMUNITY): Payer: BC Managed Care – PPO

## 2013-10-09 DIAGNOSIS — R1013 Epigastric pain: Secondary | ICD-10-CM | POA: Insufficient documentation

## 2013-10-09 DIAGNOSIS — E860 Dehydration: Secondary | ICD-10-CM

## 2013-10-09 DIAGNOSIS — Z8669 Personal history of other diseases of the nervous system and sense organs: Secondary | ICD-10-CM | POA: Insufficient documentation

## 2013-10-09 DIAGNOSIS — Z7982 Long term (current) use of aspirin: Secondary | ICD-10-CM | POA: Insufficient documentation

## 2013-10-09 DIAGNOSIS — E119 Type 2 diabetes mellitus without complications: Secondary | ICD-10-CM | POA: Insufficient documentation

## 2013-10-09 DIAGNOSIS — I10 Essential (primary) hypertension: Secondary | ICD-10-CM

## 2013-10-09 DIAGNOSIS — Z79899 Other long term (current) drug therapy: Secondary | ICD-10-CM | POA: Insufficient documentation

## 2013-10-09 DIAGNOSIS — Z792 Long term (current) use of antibiotics: Secondary | ICD-10-CM | POA: Insufficient documentation

## 2013-10-09 DIAGNOSIS — R1110 Vomiting, unspecified: Secondary | ICD-10-CM | POA: Insufficient documentation

## 2013-10-09 LAB — COMPREHENSIVE METABOLIC PANEL
ALBUMIN: 4.4 g/dL (ref 3.5–5.2)
ALT: 23 U/L (ref 0–53)
AST: 21 U/L (ref 0–37)
Alkaline Phosphatase: 56 U/L (ref 39–117)
BUN: 14 mg/dL (ref 6–23)
CALCIUM: 9.9 mg/dL (ref 8.4–10.5)
CO2: 24 mEq/L (ref 19–32)
CREATININE: 0.78 mg/dL (ref 0.50–1.35)
Chloride: 98 mEq/L (ref 96–112)
GFR calc Af Amer: >90 mL/min (ref >90)
GFR calc non Af Amer: >90 mL/min (ref >90)
Glucose, Bld: 191 mg/dL — ABNORMAL HIGH (ref 70–99)
Potassium: 3.9 mEq/L (ref 3.7–5.3)
Sodium: 139 mEq/L (ref 137–147)
Total Bilirubin: 1 mg/dL (ref 0.3–1.2)
Total Protein: 8.4 g/dL — ABNORMAL HIGH (ref 6.0–8.3)

## 2013-10-09 LAB — CBC WITH DIFFERENTIAL/PLATELET
BASOS ABS: 0 10*3/uL (ref 0.0–0.1)
Basophils Relative: 0 % (ref 0–1)
EOS PCT: 0 % (ref 0–5)
Eosinophils Absolute: 0 10*3/uL (ref 0.0–0.7)
HEMATOCRIT: 42.7 % (ref 39.0–52.0)
Hemoglobin: 14.4 g/dL (ref 13.0–17.0)
Lymphocytes Relative: 16 % (ref 12–46)
Lymphs Abs: 1.1 10*3/uL (ref 0.7–4.0)
MCH: 33.4 pg (ref 26.0–34.0)
MCHC: 33.7 g/dL (ref 30.0–36.0)
MCV: 99.1 fL (ref 78.0–100.0)
MONO ABS: 0.4 10*3/uL (ref 0.1–1.0)
Monocytes Relative: 6 % (ref 3–12)
Neutro Abs: 5.5 10*3/uL (ref 1.7–7.7)
Neutrophils Relative %: 78 % — ABNORMAL HIGH (ref 43–77)
PLATELETS: 236 10*3/uL (ref 150–400)
RBC: 4.31 MIL/uL (ref 4.22–5.81)
RDW: 12.3 % (ref 11.5–15.5)
WBC: 7 10*3/uL (ref 4.0–10.5)

## 2013-10-09 LAB — URINALYSIS, ROUTINE W REFLEX MICROSCOPIC
Bilirubin Urine: NEGATIVE
Ketones, ur: 40 mg/dL — AB
LEUKOCYTES UA: NEGATIVE
Nitrite: NEGATIVE
PH: 7 (ref 5.0–8.0)
PROTEIN: 100 mg/dL — AB
Specific Gravity, Urine: 1.03 (ref 1.005–1.030)
Urobilinogen, UA: 1 mg/dL (ref 0.0–1.0)

## 2013-10-09 LAB — URINE MICROSCOPIC-ADD ON

## 2013-10-09 LAB — LIPASE, BLOOD: Lipase: 11 U/L (ref 11–59)

## 2013-10-09 LAB — CBG MONITORING, ED: Glucose-Capillary: 173 mg/dL — ABNORMAL HIGH (ref 70–99)

## 2013-10-09 MED ORDER — LABETALOL HCL 100 MG PO TABS: 100.0000 mg | ORAL_TABLET | Freq: Two times a day (BID) | ORAL | Status: DC

## 2013-10-09 MED ORDER — SODIUM CHLORIDE 0.9 % IV BOLUS (SEPSIS)
1000.0000 mL | Freq: Once | INTRAVENOUS | Status: AC
  Administered 2013-10-09: 1000 mL via INTRAVENOUS

## 2013-10-09 MED ORDER — LABETALOL HCL 5 MG/ML IV SOLN
10.0000 mg | Freq: Once | INTRAVENOUS | Status: AC
  Administered 2013-10-09: 10 mg via INTRAVENOUS
  Filled 2013-10-09: qty 4

## 2013-10-09 MED ORDER — HYDRALAZINE HCL 20 MG/ML IJ SOLN: 10.0000 mg | Freq: Once | INTRAMUSCULAR | Status: DC

## 2013-10-09 MED ORDER — CLONIDINE HCL 0.1 MG PO TABS
0.1000 mg | ORAL_TABLET | Freq: Once | ORAL | Status: AC
  Administered 2013-10-09: 0.1 mg via ORAL
  Filled 2013-10-09: qty 1

## 2013-10-09 MED ORDER — ONDANSETRON 4 MG PO TBDP: ORAL_TABLET | ORAL | Status: DC

## 2013-10-09 MED ORDER — ONDANSETRON HCL 4 MG/2ML IJ SOLN
4.0000 mg | Freq: Once | INTRAMUSCULAR | Status: AC
  Administered 2013-10-09: 4 mg via INTRAVENOUS
  Filled 2013-10-09: qty 2

## 2013-10-09 NOTE — Discharge Instructions (Signed)
Continue your meds. Add labetalol to control your blood pressure.  Stay hydrated.   Take zofran for nausea.   Follow up with your doctor. Your blood pressure needs to be rechecked by your doctor.   Return to ER if you have vomiting, worse weakness.

## 2013-10-09 NOTE — ED Notes (Signed)
PT was in here 1 mo ago in order to try and get BS under control, MD increased levimir dose. PT's wife notice the PT had huge swollen ankles last week and Thursday trying to get ready for work, PT wasn't able to put on his shoes d/t swollen feet. PT didn't work Friday, felt worse Saturday and didn't eat. Polyuria Saturday and PT stayed in bed. PT also has had intermittent wheezing for the past month, but didn't notice any wheezing yesterday.

## 2013-10-09 NOTE — ED Notes (Signed)
Pt states that he has had bodyaches, chills, for 5 days pt reports N/V today. Pt reports increased urination. Pt has also had leg swelling for the last month

## 2013-10-09 NOTE — ED Notes (Signed)
Awaiting discharge paperwork from EDP

## 2013-10-09 NOTE — ED Provider Notes (Signed)
CSN: [id number]     Arrival date & time 10/09/13  1054 History   First MD Initiated Contact with Patient 10/09/13 1057     Chief Complaint  Patient presents with  . Chills     (Consider location/radiation/quality/duration/timing/severity/associated sxs/prior Treatment) The history is provided by the patient and the spouse.  David [name] is a 68 y.o. male hx of DM, HTN here with vomiting, chills. Patient has recently admitted for gastroenteritis in March and hypotension and UTI earlier this month. He's had some subjective chills for the last several days, started vomiting today. Has some mild epigastric pain when he vomits. He also had some leg swelling for the last month after the hospitalization and started urinating more often yesterday and the leg swelling improved.     Past Medical History  Diagnosis Date  . Diabetes mellitus without complication   . Hypertension   . Glaucoma    Past Surgical History  Procedure Laterality Date  . Joint replacement    . Back surgery    . Cyst excision      on Back  . Total hip arthroplasty      Right    No family history on file. History  Substance Use Topics  . Smoking status: Never Smoker   . Smokeless tobacco: Not on file  . Alcohol Use: No    Review of Systems  Constitutional: Positive for chills.  Gastrointestinal: Positive for vomiting and abdominal pain.  All other systems reviewed and are negative.     Allergies  Review of patient's allergies indicates no known allergies.  Home Medications   Prior to Admission medications   Medication Sig Start Date End Date Taking? Authorizing Provider  ALPRAZolam Duanne Moron) 1 MG tablet Take 1 mg by mouth daily as needed for anxiety.  05/23/13   Historical Provider, MD  amLODipine (NORVASC) 10 MG tablet Take 1 tablet (10 mg total) by mouth daily. 09/17/13   Simbiso Ranga, MD  aspirin EC 81 MG tablet Take 81 mg by mouth daily.    Historical Provider, MD  bimatoprost (LUMIGAN) 0.03 %  ophthalmic solution Place 1 drop into both eyes at bedtime.    Historical Provider, MD  carvedilol (COREG) 12.5 MG tablet Take 12.5 mg by mouth 2 (two) times daily with a meal.    Historical Provider, MD  FARXIGA 10 MG TABS Take 10 mg by mouth daily. 08/07/13   Historical Provider, MD  Hydrocodone-Acetaminophen 7.5-300 MG TABS Take 1 tablet by mouth 2 (two) times daily as needed (for pain).  07/22/13   Historical Provider, MD  LEVEMIR FLEXTOUCH 100 UNIT/ML Pen Inject 40 Units into the skin at bedtime. 08/04/13   Historical Provider, MD  levofloxacin (LEVAQUIN) 750 MG tablet Take 1 tablet (750 mg total) by mouth daily. 09/17/13   Simbiso Ranga, MD  Multiple Vitamins-Minerals (CENTRUM SILVER ADULT 50+) TABS Take 1 tablet by mouth daily.    Historical Provider, MD  NOVOLOG FLEXPEN 100 UNIT/ML FlexPen Inject 10 Units into the skin 3 (three) times daily with meals. 07/28/13   Historical Provider, MD  ondansetron (ZOFRAN) 4 MG tablet Take 1 tablet (4 mg total) by mouth every 6 (six) hours. 09/13/13   Doy Hutching, MD  pantoprazole (PROTONIX) 20 MG tablet Take 2 tablets (40 mg total) by mouth daily. 09/13/13   Doy Hutching, MD  pregabalin (LYRICA) 50 MG capsule Take 1 capsule (50 mg total) by mouth 3 (three) times daily. 09/17/13   Nat Math, MD  Tetrahydrozoline HCl (  EYE DROPS OP) Apply 2 drops to eye 3 (three) times daily as needed (for dry eyes).    Historical Provider, MD   BP 175/69  Pulse 99  Temp(Src) 98.5 F (36.9 C) (Axillary)  Resp 22  SpO2 99% Physical Exam  Nursing note and vitals reviewed. Constitutional: He is oriented to person, place, and time.  Vomiting, dehydrated   HENT:  Head: Normocephalic.  MM slightly dry   Eyes: Conjunctivae are normal. Pupils are equal, round, and reactive to light.  Neck: Normal range of motion. Neck supple.  Cardiovascular: Normal rate, regular rhythm and normal heart sounds.   Pulmonary/Chest: Effort normal and breath sounds normal. No respiratory  distress. He has no wheezes.  Abdominal: Soft. Bowel sounds are normal.  Mild epigastric tenderness, no rebound   Musculoskeletal: Normal range of motion. He exhibits no edema and no tenderness.  No edema   Neurological: He is alert and oriented to person, place, and time. No cranial nerve deficit. Coordination normal.  Skin: Skin is warm and dry.  Psychiatric: He has a normal mood and affect. His behavior is normal. Judgment and thought content normal.    ED Course  Procedures (including critical care time) Labs Review Labs Reviewed  CBC WITH DIFFERENTIAL - Abnormal; Notable for the following:    Neutrophils Relative % 78 (*)    All other components within normal limits  COMPREHENSIVE METABOLIC PANEL - Abnormal; Notable for the following:    Glucose, Bld 191 (*)    Total Protein 8.4 (*)    All other components within normal limits  URINALYSIS, ROUTINE W REFLEX MICROSCOPIC - Abnormal; Notable for the following:    Glucose, UA >1000 (*)    Hgb urine dipstick TRACE (*)    Ketones, ur 40 (*)    Protein, ur 100 (*)    All other components within normal limits  CBG MONITORING, ED - Abnormal; Notable for the following:    Glucose-Capillary 173 (*)    All other components within normal limits  LIPASE, BLOOD  URINE MICROSCOPIC-ADD ON    Imaging Review Dg Abd Acute W/chest  10/09/2013   CLINICAL DATA:  Weakness.  Epigastric pain.  Vomiting.  EXAM: ACUTE ABDOMEN SERIES (ABDOMEN 2 VIEW & CHEST 1 VIEW)  COMPARISON:  09/14/2013  FINDINGS: There are low lung volumes. Allowing for this, the lungs are clear. No pleural effusion or pneumothorax. The cardiac silhouette is normal in size. Normal mediastinal and hilar contours.  Normal bowel gas pattern. There is mild to moderately increased colonic stool. No obstruction or free air. Soft tissues are unremarkable. Right hip prosthesis is well aligned. Degenerative changes are noted throughout the visualized spine.  IMPRESSION: No acute findings.   Mild to moderate increased stool in the colon.   Electronically Signed   By: Lajean Manes M.D.   On: 10/09/2013 14:27     EKG Interpretation None      MDM   Final diagnoses:  None   David [name] is a 68 y.o. male here with vomiting, chills. Consider gastroenteritis again. No diarrhea to suggest C diff. Unclear why he has these episodes last several months and he hasn't have work up for diabetic gastroparesis. I think the urinary frequency likely from auto diuresis after being in the hospital. Will check labs, CXR, UA and give antiemetics and reassess.   3:19 PM Tolerated juice. Hypertensive, labs at baseline. Given labetalol with good improvement. Will add labetlol 100 mg BID for hypertension. Some ketones in urine  suggestive of dehydration. Given IVF and zofran and tolerated PO. Will d/c home.    Wandra Arthurs, MD 10/09/13 1520

## 2013-10-24 ENCOUNTER — Other Ambulatory Visit: Payer: Self-pay | Admitting: Family Medicine

## 2013-10-24 DIAGNOSIS — M5416 Radiculopathy, lumbar region: Secondary | ICD-10-CM

## 2013-11-02 ENCOUNTER — Ambulatory Visit
Admission: RE | Admit: 2013-11-02 | Discharge: 2013-11-02 | Disposition: A | Discharge status: Home / Self Care | Payer: BC Managed Care – PPO | Source: Ambulatory Visit | Attending: Family Medicine | Admitting: Family Medicine

## 2013-11-02 DIAGNOSIS — M5416 Radiculopathy, lumbar region: Secondary | ICD-10-CM

## 2013-11-04 ENCOUNTER — Other Ambulatory Visit: Payer: Self-pay | Admitting: Family Medicine

## 2013-11-04 DIAGNOSIS — M25552 Pain in left hip: Secondary | ICD-10-CM

## 2013-11-08 ENCOUNTER — Other Ambulatory Visit: Payer: Self-pay | Admitting: Family Medicine

## 2013-11-08 DIAGNOSIS — M25552 Pain in left hip: Secondary | ICD-10-CM

## 2013-11-09 ENCOUNTER — Ambulatory Visit
Admission: RE | Admit: 2013-11-09 | Discharge: 2013-11-09 | Disposition: A | Discharge status: Home / Self Care | Payer: BC Managed Care – PPO | Source: Ambulatory Visit | Attending: Family Medicine | Admitting: Family Medicine

## 2013-11-09 DIAGNOSIS — M25552 Pain in left hip: Secondary | ICD-10-CM

## 2013-11-10 ENCOUNTER — Ambulatory Visit
Admission: RE | Admit: 2013-11-10 | Discharge: 2013-11-10 | Disposition: A | Discharge status: Home / Self Care | Payer: BC Managed Care – PPO | Source: Ambulatory Visit | Attending: Family Medicine | Admitting: Family Medicine

## 2013-11-10 ENCOUNTER — Other Ambulatory Visit: Payer: BC Managed Care – PPO

## 2013-11-10 DIAGNOSIS — M25552 Pain in left hip: Secondary | ICD-10-CM

## 2013-11-10 MED ORDER — IOHEXOL 180 MG/ML  SOLN
1.0000 mL | Freq: Once | INTRAMUSCULAR | Status: AC | PRN
  Administered 2013-11-10: 1 mL via INTRA_ARTICULAR

## 2013-11-10 MED ORDER — METHYLPREDNISOLONE ACETATE 40 MG/ML INJ SUSP (RADIOLOG
120.0000 mg | Freq: Once | INTRAMUSCULAR | Status: AC
  Administered 2013-11-10: 120 mg via INTRA_ARTICULAR

## 2013-11-14 ENCOUNTER — Other Ambulatory Visit: Payer: BC Managed Care – PPO

## 2013-11-15 ENCOUNTER — Other Ambulatory Visit: Payer: BC Managed Care – PPO

## 2013-11-29 ENCOUNTER — Other Ambulatory Visit: Payer: Self-pay | Admitting: Family Medicine

## 2013-11-29 DIAGNOSIS — M5416 Radiculopathy, lumbar region: Secondary | ICD-10-CM

## 2013-11-30 ENCOUNTER — Ambulatory Visit
Admission: RE | Admit: 2013-11-30 | Discharge: 2013-11-30 | Disposition: A | Discharge status: Home / Self Care | Payer: BC Managed Care – PPO | Source: Ambulatory Visit | Attending: Family Medicine | Admitting: Family Medicine

## 2013-11-30 ENCOUNTER — Other Ambulatory Visit: Payer: Self-pay | Admitting: Family Medicine

## 2013-11-30 DIAGNOSIS — M5416 Radiculopathy, lumbar region: Secondary | ICD-10-CM

## 2013-11-30 MED ORDER — METHYLPREDNISOLONE ACETATE 40 MG/ML INJ SUSP (RADIOLOG
120.0000 mg | Freq: Once | INTRAMUSCULAR | Status: AC
  Administered 2013-11-30: 120 mg via EPIDURAL

## 2013-11-30 MED ORDER — IOHEXOL 180 MG/ML  SOLN
1.0000 mL | Freq: Once | INTRAMUSCULAR | Status: AC | PRN
  Administered 2013-11-30: 1 mL via EPIDURAL

## 2013-11-30 NOTE — Discharge Instructions (Signed)

## 2013-12-05 ENCOUNTER — Telehealth: Payer: Self-pay | Admitting: Neurology

## 2013-12-05 ENCOUNTER — Encounter: Payer: BC Managed Care – PPO | Admitting: Neurology

## 2013-12-05 NOTE — Telephone Encounter (Signed)
This patient canceled the EMG appointment several hours prior to the study.

## 2014-01-05 ENCOUNTER — Other Ambulatory Visit: Payer: Self-pay | Admitting: Family Medicine

## 2014-01-05 ENCOUNTER — Ambulatory Visit: Payer: BC Managed Care – PPO

## 2014-01-05 DIAGNOSIS — M5416 Radiculopathy, lumbar region: Secondary | ICD-10-CM

## 2014-02-07 ENCOUNTER — Ambulatory Visit
Admission: RE | Admit: 2014-02-07 | Discharge: 2014-02-07 | Disposition: A | Discharge status: Home / Self Care | Payer: BC Managed Care – PPO | Source: Ambulatory Visit | Attending: Family Medicine | Admitting: Family Medicine

## 2014-02-07 DIAGNOSIS — M5416 Radiculopathy, lumbar region: Secondary | ICD-10-CM

## 2014-02-07 MED ORDER — METHYLPREDNISOLONE ACETATE 40 MG/ML INJ SUSP (RADIOLOG
120.0000 mg | Freq: Once | INTRAMUSCULAR | Status: AC
  Administered 2014-02-07: 120 mg via EPIDURAL

## 2014-02-07 MED ORDER — IOHEXOL 180 MG/ML  SOLN
1.0000 mL | Freq: Once | INTRAMUSCULAR | Status: AC | PRN
  Administered 2014-02-07: 1 mL via EPIDURAL

## 2014-04-05 ENCOUNTER — Encounter: Payer: Self-pay | Admitting: Neurology

## 2014-04-11 ENCOUNTER — Encounter: Payer: Self-pay | Admitting: Neurology

## 2014-05-24 DIAGNOSIS — M48061 Spinal stenosis, lumbar region without neurogenic claudication: Secondary | ICD-10-CM | POA: Insufficient documentation

## 2014-06-28 ENCOUNTER — Encounter: Payer: BC Managed Care – PPO | Attending: Family Medicine | Admitting: *Deleted

## 2014-06-28 ENCOUNTER — Encounter: Payer: Self-pay | Admitting: *Deleted

## 2014-06-28 VITALS — Ht 74.0 in | Wt 246.3 lb

## 2014-06-28 DIAGNOSIS — Z794 Long term (current) use of insulin: Secondary | ICD-10-CM | POA: Insufficient documentation

## 2014-06-28 DIAGNOSIS — Z713 Dietary counseling and surveillance: Secondary | ICD-10-CM | POA: Insufficient documentation

## 2014-06-28 DIAGNOSIS — E119 Type 2 diabetes mellitus without complications: Secondary | ICD-10-CM | POA: Diagnosis not present

## 2014-06-28 NOTE — Patient Instructions (Addendum)
Plan:  Aim for 3-4 Carb Choices per meal (45-60 grams) +/- 1 either way  Aim for 0-15 Carbs per snack if hungry  Include protein in moderation with your meals and snacks Consider reading food labels for Total Carbohydrate and Fat Grams of foods Consider  increasing your activity level daily as tolerated Consider checking BG at alternate times per day as directed by MD  Consider taking medication as directed by MD  Canned fruit - POUR OUT THE SYRUP, RINSE THE FRUIT THEN EAT THE FRUIT! DO NOT BUY THE DONUT STICKS  Write down our thoughts...Marland Kitchenhow do you feel Diabetes, back, legs, loss of parents, depression..... Consider medication for depression, consider therapist Control the things you can, accept the things you can't

## 2014-07-03 ENCOUNTER — Encounter: Payer: Self-pay | Admitting: *Deleted

## 2014-07-03 NOTE — Progress Notes (Signed)
Diabetes Self-Management Education   Appt. Start Time: 1400 Appt. End Time: [id number]  07/03/2014  [name], identified by name and date of birth, is a 69 y.o. male with a diagnosis of Diabetes: Type 2.  Other people present during visit:  Spouse/SO David [name]. Mr and Mrs David [name] present to [facility] for DSME. [name] is presently disabled due to physical limitations. Due to his unsteady gait he does not go down the stairs at the house unless his wife is home. They have an area set up upstairs with food, microwave, refrigerator. [name] does the majority of the grocery shopping. Mrs David [name] does not go the to store with him because he goes up and down every aisle looking at every item. He is making poor food choices and not exercising portion control. [name] noted that since he lost his Mother he has emotionally not been the same. Through extensive discussion it was determined that he had some symptoms of depression, anger, frustration related to his physical disability and his diabetes. When he got uncomfortable with the conversation he would laugh and redirect the conversation. 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 She seems to have some insight into his difficulties and behaviors but is unable to address these with him. He becomes frustrated and angry. I recommended that he discuss his feelings with Dr. Sandi [name]. The potential for some type of antidepressant and sessions with a therapist might be beneficial. I question our ability to be successful with diabetes management prior to addressing his mental state.  I received communication from Mrs. David [name] that David [name] had made some dietary changes since our meeting. She did not mention any discussion with Dr. Sandi [name].  ASSESSMENT  Height 6\' 2"  (1.88 m), weight 246 lb 4.8 oz (111.721 kg). Body mass index is 31.61 kg/(m^2).  Initial Visit Information:  Are you taking your medications as prescribed?: Yes How often do  you need to have someone help you when you read instructions, pamphlets, or other written materials from your doctor or pharmacy?: 1 - Never   Psychosocial:   Patient Belief/Attitude about Diabetes: Other (comment) (not motivated) Self-care barriers: Unsteady gait/risk for falls Self-management support: Doctor's office, Family, CDE visits Other persons present: Spouse/SO Patient Concerns: Nutrition/Meal planning, Glycemic Control Special Needs: Instruct caregiver Preferred Learning Style: No preference indicated Learning Readiness: Other (comment) (Patient exibits S&S of depression. I encouraged discussion with PCP and potential counseling)  Complications:   Last HgB A1C per patient/outside source: 9.3 mg/dL  Diet Intake:  Breakfast: coffee black, apple, donut stick Lunch: dinner left overs,  Dinner: salad, soup, Snack (evening): peanut butter & jelly sandwich  Exercise:  Exercise: ADL's  Individualized Plan for Diabetes Self-Management Training:   Learning Objective:  Patient will have a greater understanding of diabetes self-management. Patient education plan per assessed needs and concerns is to attend individual sessions      Education Topics Reviewed with Patient Today:  Factors that contribute to the development of diabetes Role of diet in the treatment of diabetes and the relationship between the three main macronutrients and blood glucose level, Food label reading, portion sizes and measuring food., Carbohydrate counting, Information on hints to eating out and maintain blood glucose control., Meal options for control of blood glucose level and chronic complications. Purpose and frequency of SMBG., Yearly dilated eye exam, Daily foot exams Taught treatment of hypoglycemia - the 15 rule. Relationship between chronic complications and blood glucose control, Assessed and discussed foot care and  prevention of foot problems, Dental care Worked with patient to identify  barriers to care and solutions, Helped patient identify a support system for diabetes management, Identified and addressed patients feelings and concerns about diabetes   PATIENTS GOALS/Plan (Developed by the patient):  Nutrition: General guidelines for healthy choices and portions discussed Medications: take my medication as prescribed Monitoring : test my blood glucose as discussed (note x per day with comment) Reducing Risk: do foot checks daily  Patient Instructions  Plan:  Aim for 3-4 Carb Choices per meal (45-60 grams) +/- 1 either way  Aim for 0-15 Carbs per snack if hungry  Include protein in moderation with your meals and snacks Consider reading food labels for Total Carbohydrate and Fat Grams of foods Consider  increasing your activity level daily as tolerated Consider checking BG at alternate times per day as directed by MD  Consider taking medication as directed by MD  Canned fruit - POUR OUT THE SYRUP, RINSE THE FRUIT THEN EAT THE FRUIT! DO NOT BUY THE DONUT STICKS  Write down our thoughts...Marland Kitchenhow do you feel Diabetes, back, legs, loss of parents, depression..... Consider medication for depression, consider therapist Control the things you can, accept the things you can't  Expected Outcomes:  Demonstrated interest in learning. Expect positive outcomes  Education material provided: Living Well with Diabetes, A1C conversion sheet, Meal plan card, My Plate and Snack sheet  If problems or questions, patient to contact team via:  Phone and Email  Future DSME appointment: 4-6 wks

## 2014-07-06 ENCOUNTER — Other Ambulatory Visit: Payer: Self-pay | Admitting: Family Medicine

## 2014-07-06 DIAGNOSIS — M5416 Radiculopathy, lumbar region: Secondary | ICD-10-CM

## 2014-07-19 ENCOUNTER — Telehealth: Payer: Self-pay | Admitting: *Deleted

## 2014-07-21 ENCOUNTER — Ambulatory Visit
Admission: RE | Admit: 2014-07-21 | Discharge: 2014-07-21 | Disposition: A | Discharge status: Home / Self Care | Payer: BC Managed Care – PPO | Source: Ambulatory Visit | Attending: Family Medicine | Admitting: Family Medicine

## 2014-07-21 DIAGNOSIS — M5416 Radiculopathy, lumbar region: Secondary | ICD-10-CM

## 2014-07-21 MED ORDER — METHYLPREDNISOLONE ACETATE 40 MG/ML INJ SUSP (RADIOLOG
120.0000 mg | Freq: Once | INTRAMUSCULAR | Status: AC
  Administered 2014-07-21: 120 mg via EPIDURAL

## 2014-07-21 MED ORDER — IOHEXOL 180 MG/ML  SOLN
1.0000 mL | Freq: Once | INTRAMUSCULAR | Status: AC | PRN
  Administered 2014-07-21: 1 mL via EPIDURAL

## 2014-09-05 ENCOUNTER — Ambulatory Visit: Payer: BC Managed Care – PPO | Admitting: *Deleted

## 2015-01-09 ENCOUNTER — Other Ambulatory Visit: Payer: Self-pay | Admitting: Family

## 2015-01-09 ENCOUNTER — Ambulatory Visit
Admission: RE | Admit: 2015-01-09 | Discharge: 2015-01-09 | Disposition: A | Discharge status: Home / Self Care | Payer: BC Managed Care – PPO | Source: Ambulatory Visit | Attending: Family | Admitting: Family

## 2015-01-09 DIAGNOSIS — M79671 Pain in right foot: Secondary | ICD-10-CM

## 2015-01-15 NOTE — Telephone Encounter (Signed)
07/19/2014 01:30 PM Phone (Incoming) David [name], David [name] (Self) [phone] (H)  Left msg that he was making better choices with his food. Has starting eating Fiber 1 rather than donut stick for breakfast. Having fruit for snack. Continuing with insulin as directed.   By Bernie Covey, RN  07/19/2014 02:14 PM Phone (Outgoing) David [name], David [name] (Self) [phone] (H)  Left Message - Encouraged patient on the improvements he has made on his food choices. encouraged him to use abdomen for insulin injection site for best absorption and consistency, hold 10seconds and remove.   By Bernie Covey, RN

## 2015-02-06 ENCOUNTER — Emergency Department (HOSPITAL_COMMUNITY): Payer: BC Managed Care – PPO

## 2015-02-06 ENCOUNTER — Encounter (HOSPITAL_COMMUNITY): Payer: Self-pay | Admitting: Emergency Medicine

## 2015-02-06 ENCOUNTER — Inpatient Hospital Stay (HOSPITAL_COMMUNITY)
Admission: EM | Admit: 2015-02-06 | Discharge: 2015-02-09 | DRG: 872 | Disposition: A | Discharge status: Home / Self Care | Payer: BC Managed Care – PPO | Attending: Internal Medicine | Admitting: Internal Medicine

## 2015-02-06 DIAGNOSIS — L0390 Cellulitis, unspecified: Secondary | ICD-10-CM

## 2015-02-06 DIAGNOSIS — E119 Type 2 diabetes mellitus without complications: Secondary | ICD-10-CM | POA: Diagnosis not present

## 2015-02-06 DIAGNOSIS — E1142 Type 2 diabetes mellitus with diabetic polyneuropathy: Secondary | ICD-10-CM | POA: Diagnosis present

## 2015-02-06 DIAGNOSIS — Z79899 Other long term (current) drug therapy: Secondary | ICD-10-CM

## 2015-02-06 DIAGNOSIS — M549 Dorsalgia, unspecified: Secondary | ICD-10-CM | POA: Diagnosis present

## 2015-02-06 DIAGNOSIS — E11621 Type 2 diabetes mellitus with foot ulcer: Secondary | ICD-10-CM | POA: Diagnosis present

## 2015-02-06 DIAGNOSIS — I10 Essential (primary) hypertension: Secondary | ICD-10-CM | POA: Diagnosis present

## 2015-02-06 DIAGNOSIS — Z8249 Family history of ischemic heart disease and other diseases of the circulatory system: Secondary | ICD-10-CM

## 2015-02-06 DIAGNOSIS — F419 Anxiety disorder, unspecified: Secondary | ICD-10-CM | POA: Diagnosis present

## 2015-02-06 DIAGNOSIS — I129 Hypertensive chronic kidney disease with stage 1 through stage 4 chronic kidney disease, or unspecified chronic kidney disease: Secondary | ICD-10-CM | POA: Diagnosis present

## 2015-02-06 DIAGNOSIS — M25774 Osteophyte, right foot: Secondary | ICD-10-CM | POA: Diagnosis present

## 2015-02-06 DIAGNOSIS — Z96649 Presence of unspecified artificial hip joint: Secondary | ICD-10-CM | POA: Diagnosis present

## 2015-02-06 DIAGNOSIS — N183 Chronic kidney disease, stage 3 (moderate): Secondary | ICD-10-CM | POA: Diagnosis present

## 2015-02-06 DIAGNOSIS — E118 Type 2 diabetes mellitus with unspecified complications: Secondary | ICD-10-CM

## 2015-02-06 DIAGNOSIS — L97519 Non-pressure chronic ulcer of other part of right foot with unspecified severity: Secondary | ICD-10-CM | POA: Diagnosis present

## 2015-02-06 DIAGNOSIS — Z79891 Long term (current) use of opiate analgesic: Secondary | ICD-10-CM

## 2015-02-06 DIAGNOSIS — E1122 Type 2 diabetes mellitus with diabetic chronic kidney disease: Secondary | ICD-10-CM | POA: Diagnosis present

## 2015-02-06 DIAGNOSIS — E11628 Type 2 diabetes mellitus with other skin complications: Secondary | ICD-10-CM | POA: Diagnosis present

## 2015-02-06 DIAGNOSIS — Z833 Family history of diabetes mellitus: Secondary | ICD-10-CM | POA: Diagnosis not present

## 2015-02-06 DIAGNOSIS — L03115 Cellulitis of right lower limb: Secondary | ICD-10-CM | POA: Diagnosis present

## 2015-02-06 DIAGNOSIS — N189 Chronic kidney disease, unspecified: Secondary | ICD-10-CM | POA: Diagnosis not present

## 2015-02-06 DIAGNOSIS — L089 Local infection of the skin and subcutaneous tissue, unspecified: Secondary | ICD-10-CM

## 2015-02-06 DIAGNOSIS — Z7982 Long term (current) use of aspirin: Secondary | ICD-10-CM | POA: Diagnosis not present

## 2015-02-06 DIAGNOSIS — G8929 Other chronic pain: Secondary | ICD-10-CM | POA: Diagnosis present

## 2015-02-06 DIAGNOSIS — H409 Unspecified glaucoma: Secondary | ICD-10-CM | POA: Diagnosis present

## 2015-02-06 DIAGNOSIS — E1165 Type 2 diabetes mellitus with hyperglycemia: Secondary | ICD-10-CM | POA: Diagnosis present

## 2015-02-06 DIAGNOSIS — Z794 Long term (current) use of insulin: Secondary | ICD-10-CM

## 2015-02-06 DIAGNOSIS — N179 Acute kidney failure, unspecified: Secondary | ICD-10-CM

## 2015-02-06 DIAGNOSIS — N182 Chronic kidney disease, stage 2 (mild): Secondary | ICD-10-CM | POA: Diagnosis present

## 2015-02-06 DIAGNOSIS — E871 Hypo-osmolality and hyponatremia: Secondary | ICD-10-CM | POA: Diagnosis present

## 2015-02-06 DIAGNOSIS — A419 Sepsis, unspecified organism: Secondary | ICD-10-CM | POA: Diagnosis present

## 2015-02-06 LAB — COMPREHENSIVE METABOLIC PANEL
ALT: 25 U/L (ref 17–63)
AST: 29 U/L (ref 15–41)
Albumin: 3.8 g/dL (ref 3.5–5.0)
Alkaline Phosphatase: 71 U/L (ref 38–126)
Anion gap: 12 (ref 5–15)
BUN: 22 mg/dL — AB (ref 6–20)
CO2: 27 mmol/L (ref 22–32)
CREATININE: 1.84 mg/dL — AB (ref 0.61–1.24)
Calcium: 9.2 mg/dL (ref 8.9–10.3)
Chloride: 90 mmol/L — ABNORMAL LOW (ref 101–111)
GFR calc non Af Amer: 36 mL/min — ABNORMAL LOW (ref >60)
GFR, EST AFRICAN AMERICAN: 42 mL/min — AB (ref >60)
GLUCOSE: 386 mg/dL — AB (ref 65–99)
Potassium: 3.7 mmol/L (ref 3.5–5.1)
SODIUM: 129 mmol/L — AB (ref 135–145)
Total Bilirubin: 0.8 mg/dL (ref 0.3–1.2)
Total Protein: 8.6 g/dL — ABNORMAL HIGH (ref 6.5–8.1)

## 2015-02-06 LAB — CBC WITH DIFFERENTIAL/PLATELET
BASOS ABS: 0 10*3/uL (ref 0.0–0.1)
Basophils Relative: 0 %
Eosinophils Absolute: 0 10*3/uL (ref 0.0–0.7)
Eosinophils Relative: 0 %
HCT: 36.5 % — ABNORMAL LOW (ref 39.0–52.0)
Hemoglobin: 12.5 g/dL — ABNORMAL LOW (ref 13.0–17.0)
LYMPHS ABS: 0.7 10*3/uL (ref 0.7–4.0)
Lymphocytes Relative: 4 %
MCH: 33.2 pg (ref 26.0–34.0)
MCHC: 34.2 g/dL (ref 30.0–36.0)
MCV: 97.1 fL (ref 78.0–100.0)
MONO ABS: 1.4 10*3/uL — AB (ref 0.1–1.0)
Monocytes Relative: 8 %
NEUTROS ABS: 15.5 10*3/uL — AB (ref 1.7–7.7)
Neutrophils Relative %: 88 %
Platelets: 255 10*3/uL (ref 150–400)
RBC: 3.76 MIL/uL — ABNORMAL LOW (ref 4.22–5.81)
RDW: 12.1 % (ref 11.5–15.5)
WBC: 17.6 10*3/uL — ABNORMAL HIGH (ref 4.0–10.5)

## 2015-02-06 LAB — I-STAT CG4 LACTIC ACID, ED
Lactic Acid, Venous: 2.74 mmol/L (ref 0.5–2.0)
Lactic Acid, Venous: 2.75 mmol/L (ref 0.5–2.0)

## 2015-02-06 LAB — CBG MONITORING, ED: Glucose-Capillary: 400 mg/dL — ABNORMAL HIGH (ref 65–99)

## 2015-02-06 MED ORDER — VANCOMYCIN HCL 10 G IV SOLR
1250.0000 mg | INTRAVENOUS | Status: DC
  Administered 2015-02-07 – 2015-02-08 (×2): 1250 mg via INTRAVENOUS
  Filled 2015-02-06 (×4): qty 1250

## 2015-02-06 MED ORDER — VANCOMYCIN HCL 10 G IV SOLR
2000.0000 mg | Freq: Once | INTRAVENOUS | Status: AC
  Administered 2015-02-06: 2000 mg via INTRAVENOUS
  Filled 2015-02-06: qty 2000

## 2015-02-06 MED ORDER — SODIUM CHLORIDE 0.9 % IV BOLUS (SEPSIS)
1000.0000 mL | INTRAVENOUS | Status: AC
  Administered 2015-02-06 (×3): 1000 mL via INTRAVENOUS

## 2015-02-06 MED ORDER — SODIUM CHLORIDE 0.9 % IV BOLUS (SEPSIS): 500.0000 mL | INTRAVENOUS | Status: DC

## 2015-02-06 MED ORDER — VANCOMYCIN HCL IN DEXTROSE 1-5 GM/200ML-% IV SOLN: 1000.0000 mg | Freq: Once | INTRAVENOUS | Status: DC

## 2015-02-06 MED ORDER — PIPERACILLIN-TAZOBACTAM 3.375 G IVPB
3.3750 g | Freq: Three times a day (TID) | INTRAVENOUS | Status: DC
  Administered 2015-02-07 – 2015-02-08 (×5): 3.375 g via INTRAVENOUS
  Filled 2015-02-06 (×7): qty 50

## 2015-02-06 MED ORDER — PIPERACILLIN-TAZOBACTAM 3.375 G IVPB 30 MIN
3.3750 g | Freq: Once | INTRAVENOUS | Status: AC
  Administered 2015-02-06: 3.375 g via INTRAVENOUS
  Filled 2015-02-06: qty 50

## 2015-02-06 NOTE — Progress Notes (Signed)
ANTIBIOTIC CONSULT NOTE - INITIAL  Pharmacy Consult for Zosyn and vancomycin Indication: Sepsis  No Known Allergies  Patient Measurements: TBW 111 kg as of Feb 2016  Vital Signs: Temp: 102.2 F (39 C) (09/20 2011) Temp Source: Rectal (09/20 2011) BP: 111/53 mmHg (09/20 1822) Pulse Rate: 98 (09/20 1822) Intake/Output from previous day:   Intake/Output from this shift:    Labs:  Recent Labs  02/06/15 1935  WBC 17.6*  HGB 12.5*  PLT 255  CREATININE 1.84*   CrCl cannot be calculated (Unknown ideal weight.). No results for input(s): VANCOTROUGH, VANCOPEAK, VANCORANDOM, GENTTROUGH, GENTPEAK, GENTRANDOM, TOBRATROUGH, TOBRAPEAK, TOBRARND, AMIKACINPEAK, AMIKACINTROU, AMIKACIN in the last 72 hours.   Microbiology: No results found for this or any previous visit (from the past 720 hour(s)).  Medical History: Past Medical History  Diagnosis Date  . Diabetes mellitus without complication   . Hypertension   . Glaucoma     Assessment: 69 yo M presents on 9/20 with wound to his R foot and fever. Pharmacy consulted to dose abx for sepsis. Febrile up to 102.2, WBC elevated at 17.6. SCr elevated at 1.84, normalized CrCl ~72ml/min.  Goal of Therapy:  Vancomycin trough level 15-20 mcg/ml  Resolution of infection  Plan:  Give Zosyn 3.375g IV (30 min infusion) x 1, then start Zosyn 3.375 gm IV q8h (4 hour infusion) Give vancomycin 2g IV x 1, then start 1250mg  IV Q24 tomorrow Monitor clinical picture, renal function, VT at Css F/U C&S, abx deescalation / LOT  Elenor Quinones J 02/06/2015,8:12 PM

## 2015-02-06 NOTE — ED Notes (Signed)
Patient transported to MRI 

## 2015-02-06 NOTE — Progress Notes (Signed)
Patient arrived to floor via stretcher.alert and oriented, Temp 99.2 oral, BP 153/75, HR 88, RR 19 O2sats 100% room air. 2nd bolus of normal saline infusing to Left arm. Wife states patient HAS NOT voided since 12 noon today. Patient has wound to bottom of right foot. Staets it has been there over a month. Cleaned wound with normal saline, gauze dressing and ace kerlex applied to foot. Wife at bedside, oriented both patient and wife to room and procedures. No questions or concerns regarding hospitalization.

## 2015-02-06 NOTE — ED Provider Notes (Signed)
CSN: [id number]     Arrival date & time 02/06/15  1810 History   First MD Initiated Contact with Patient 02/06/15 1946     Chief Complaint  Patient presents with  . Fever  . Wound Check     (Consider location/radiation/quality/duration/timing/severity/associated sxs/prior Treatment) Patient is a 69 y.o. male presenting with fever.  Fever Max temp prior to arrival:  102 Temp source:  Oral Severity:  Moderate Onset quality:  Gradual Timing:  Intermittent Progression:  Worsening Chronicity:  New Relieved by:  Acetaminophen Worsened by:  Nothing tried Ineffective treatments:  None tried Associated symptoms: chills and somnolence   Associated symptoms: no chest pain, no congestion, no diarrhea, no dysuria, no headaches, no myalgias, no nausea, no rhinorrhea, no sore throat and no vomiting   Associated symptoms comment:  Right foot ulcer Risk factors comment:  DM   Past Medical History  Diagnosis Date  . Diabetes mellitus without complication   . Hypertension   . Glaucoma    Past Surgical History  Procedure Laterality Date  . Joint replacement    . Back surgery    . Cyst excision      on Back  . Total hip arthroplasty      Right    Family History  Problem Relation Age of Onset  . Diabetes Other   . Hyperlipidemia Other   . Hypertension Other   . Stroke Other   . Alzheimer's disease Other    Social History  Substance Use Topics  . Smoking status: Never Smoker   . Smokeless tobacco: None  . Alcohol Use: No    Review of Systems  Constitutional: Positive for fever and chills. Negative for appetite change.  HENT: Negative for congestion, rhinorrhea and sore throat.   Eyes: Negative for visual disturbance.  Respiratory: Negative for shortness of breath and wheezing.   Cardiovascular: Negative for chest pain.  Gastrointestinal: Negative for nausea, vomiting, abdominal pain, diarrhea and constipation.  Genitourinary: Negative for dysuria and difficulty urinating.   Musculoskeletal: Negative for myalgias and arthralgias.  Skin: Positive for wound.  Neurological: Negative for syncope and headaches.  Psychiatric/Behavioral: Negative for behavioral problems.  All other systems reviewed and are negative.     Allergies  Review of patient's allergies indicates no known allergies.  Home Medications   Prior to Admission medications   Medication Sig Start Date End Date Taking? Authorizing Provider  ALPRAZolam Duanne Moron) 1 MG tablet Take 1 mg by mouth daily as needed for anxiety.  05/23/13   Historical Provider, MD  amLODipine (NORVASC) 10 MG tablet Take 1 tablet (10 mg total) by mouth daily. 09/17/13   Simbiso Ranga, MD  aspirin EC 81 MG tablet Take 81 mg by mouth daily.    Historical Provider, MD  bimatoprost (LUMIGAN) 0.03 % ophthalmic solution Place 1 drop into both eyes at bedtime.    Historical Provider, MD  carvedilol (COREG) 12.5 MG tablet Take 12.5 mg by mouth 2 (two) times daily with a meal.    Historical Provider, MD  cetirizine (ZYRTEC) 10 MG tablet Take 10 mg by mouth daily.    Historical Provider, MD  FARXIGA 10 MG TABS Take 10 mg by mouth daily. 08/07/13   Historical Provider, MD  Hydrocodone-Acetaminophen 7.5-300 MG TABS Take 1 tablet by mouth 2 (two) times daily as needed (for pain).  07/22/13   Historical Provider, MD  labetalol (NORMODYNE) 100 MG tablet Take 1 tablet (100 mg total) by mouth 2 (two) times daily. 10/09/13   Shanon Brow  Tamsen Meek, MD  LEVEMIR FLEXTOUCH 100 UNIT/ML Pen Inject 40 Units into the skin at bedtime. 08/04/13   Historical Provider, MD  Multiple Vitamins-Minerals (CENTRUM SILVER ADULT 50+) TABS Take 1 tablet by mouth daily.    Historical Provider, MD  NOVOLOG FLEXPEN 100 UNIT/ML FlexPen Inject 10 Units into the skin 3 (three) times daily with meals. 07/28/13   Historical Provider, MD  ondansetron (ZOFRAN ODT) 4 MG disintegrating tablet 4mg  ODT q4 hours prn nausea/vomit 10/09/13   Wandra Arthurs, MD  OxyCODONE (OXYCONTIN) 20 mg T12A 12 hr  tablet Take 20 mg by mouth 3 (three) times daily.    Historical Provider, MD  pantoprazole (PROTONIX) 20 MG tablet Take 2 tablets (40 mg total) by mouth daily. 09/13/13   Doy Hutching, MD  pregabalin (LYRICA) 100 MG capsule Take 100 mg by mouth 2 (two) times daily.    Historical Provider, MD  Tetrahydrozoline HCl (EYE DROPS OP) Apply 2 drops to eye 3 (three) times daily as needed (for dry eyes).    Historical Provider, MD   BP 111/53 mmHg  Pulse 98  Temp(Src) 102.2 F (39 C) (Rectal)  Resp 16  SpO2 98% Physical Exam  Constitutional: He is oriented to person, place, and time. He appears well-developed and well-nourished.  HENT:  Head: Normocephalic and atraumatic.  Eyes: EOM are normal.  Neck: Normal range of motion.  Cardiovascular: Normal rate, regular rhythm and normal heart sounds.   No murmur heard. Pulmonary/Chest: Effort normal and breath sounds normal. No respiratory distress.  Abdominal: Soft. There is no tenderness.  Musculoskeletal: He exhibits no edema.  Neurological: He is alert and oriented to person, place, and time.  Skin: He is not diaphoretic.    ED Course  Procedures (including critical care time) Labs Review Labs Reviewed  COMPREHENSIVE METABOLIC PANEL - Abnormal; Notable for the following:    Sodium 129 (*)    Chloride 90 (*)    Glucose, Bld 386 (*)    BUN 22 (*)    Creatinine, Ser 1.84 (*)    Total Protein 8.6 (*)    GFR calc non Af Amer 36 (*)    GFR calc Af Amer 42 (*)    All other components within normal limits  CBC WITH DIFFERENTIAL/PLATELET - Abnormal; Notable for the following:    WBC 17.6 (*)    RBC 3.76 (*)    Hemoglobin 12.5 (*)    HCT 36.5 (*)    Neutro Abs 15.5 (*)    Monocytes Absolute 1.4 (*)    All other components within normal limits  CBG MONITORING, ED - Abnormal; Notable for the following:    Glucose-Capillary 400 (*)    All other components within normal limits  I-STAT CG4 LACTIC ACID, ED - Abnormal; Notable for the  following:    Lactic Acid, Venous 2.74 (*)    All other components within normal limits  I-STAT CG4 LACTIC ACID, ED - Abnormal; Notable for the following:    Lactic Acid, Venous 2.75 (*)    All other components within normal limits  CULTURE, BLOOD (ROUTINE X 2)  CULTURE, BLOOD (ROUTINE X 2)  URINE CULTURE  URINALYSIS, ROUTINE W REFLEX MICROSCOPIC (NOT AT [facility])    Imaging Review Dg Foot Complete Right  02/06/2015   CLINICAL DATA:  Wound tube ball of right foot x2 days  EXAM: RIGHT FOOT COMPLETE - 3+ VIEW  COMPARISON:  01/08/2015  FINDINGS: Gas and soft tissue swelling overlies the fifth metatarsal phalangeal joint. No  underlying bone erosion identified. No acute fracture or subluxation identified. Second through fifth hammertoe deformities are noted.  IMPRESSION: 1. Soft tissue swelling and gas overlies the fifth MTP joint. No acute bone abnormality noted.   Electronically Signed   By: Kerby Moors M.D.   On: 02/06/2015 19:37   I have personally reviewed and evaluated these images and lab results as part of my medical decision-making.   EKG Interpretation   Date/Time:  Tuesday February 06 2015 20:20:38 EDT Ventricular Rate:  89 PR Interval:  189 QRS Duration: 86 QT Interval:  383 QTC Calculation: 466 R Axis:   27 Text Interpretation:  Sinus rhythm No significant change was found  Confirmed by Wyvonnia Dusky  MD, STEPHEN [phone]) on 02/06/2015 8:32:46 PM      MDM   Final diagnoses:  Soft tissue infection  Sepsis, due to unspecified organism  AKI (acute kidney injury)   Patient is a 69 year old male with a history of diabetes controlled with insulin, hypertension, right diabetic ulcer that presents with 5 days of fever and fatigue. Patient has noticed drainage coming from his foot ulcer that is new. Patient saw his PCP today and was advised to come to the ED for further evaluation, patient was febrile in the PCPs office. Upon arrival to the ED the patient is febrile and normotensive.  Patient has an elevated lactic acid and AK I. A full code sepsis was performed with blood cultures and antibiotics. On exam patient has no focal signs of infection other than his right foot. An x-ray was performed which showed subcutaneous gas, orthopedics was consult at and requested an MRI of the foot.   We will admit the patient to stepdown unit for further management.   Renne Musca, MD 02/06/15 2159  Ezequiel Essex, MD 02/07/15 [phone]

## 2015-02-06 NOTE — ED Notes (Signed)
Pt here for wound to right foot, fever and hyperglycemia; pt sts chills and body aches x 5 days

## 2015-02-07 ENCOUNTER — Encounter (HOSPITAL_COMMUNITY): Payer: Self-pay | Admitting: *Deleted

## 2015-02-07 DIAGNOSIS — N189 Chronic kidney disease, unspecified: Secondary | ICD-10-CM

## 2015-02-07 DIAGNOSIS — E1121 Type 2 diabetes mellitus with diabetic nephropathy: Secondary | ICD-10-CM

## 2015-02-07 DIAGNOSIS — M549 Dorsalgia, unspecified: Secondary | ICD-10-CM

## 2015-02-07 DIAGNOSIS — N182 Chronic kidney disease, stage 2 (mild): Secondary | ICD-10-CM | POA: Diagnosis present

## 2015-02-07 DIAGNOSIS — G8929 Other chronic pain: Secondary | ICD-10-CM

## 2015-02-07 DIAGNOSIS — L0390 Cellulitis, unspecified: Secondary | ICD-10-CM

## 2015-02-07 DIAGNOSIS — I10 Essential (primary) hypertension: Secondary | ICD-10-CM

## 2015-02-07 DIAGNOSIS — A419 Sepsis, unspecified organism: Principal | ICD-10-CM

## 2015-02-07 LAB — COMPREHENSIVE METABOLIC PANEL
ALK PHOS: 59 U/L (ref 38–126)
ALT: 21 U/L (ref 17–63)
AST: 19 U/L (ref 15–41)
Albumin: 3.1 g/dL — ABNORMAL LOW (ref 3.5–5.0)
Anion gap: 8 (ref 5–15)
BILIRUBIN TOTAL: 0.7 mg/dL (ref 0.3–1.2)
BUN: 24 mg/dL — AB (ref 6–20)
CALCIUM: 8.2 mg/dL — AB (ref 8.9–10.3)
CO2: 28 mmol/L (ref 22–32)
CREATININE: 1.69 mg/dL — AB (ref 0.61–1.24)
Chloride: 98 mmol/L — ABNORMAL LOW (ref 101–111)
GFR, EST AFRICAN AMERICAN: 47 mL/min — AB (ref >60)
GFR, EST NON AFRICAN AMERICAN: 40 mL/min — AB (ref >60)
Glucose, Bld: 327 mg/dL — ABNORMAL HIGH (ref 65–99)
Potassium: 3.9 mmol/L (ref 3.5–5.1)
Sodium: 134 mmol/L — ABNORMAL LOW (ref 135–145)
TOTAL PROTEIN: 6.9 g/dL (ref 6.5–8.1)

## 2015-02-07 LAB — PROTIME-INR
INR: 1.23 (ref 0.00–1.49)
Prothrombin Time: 15.6 seconds — ABNORMAL HIGH (ref 11.6–15.2)

## 2015-02-07 LAB — URINALYSIS, ROUTINE W REFLEX MICROSCOPIC
Bilirubin Urine: NEGATIVE
HGB URINE DIPSTICK: NEGATIVE
Ketones, ur: 15 mg/dL — AB
Leukocytes, UA: NEGATIVE
Nitrite: NEGATIVE
Protein, ur: 100 mg/dL — AB
Specific Gravity, Urine: 1.03 (ref 1.005–1.030)
Urobilinogen, UA: 1 mg/dL (ref 0.0–1.0)
pH: 5 (ref 5.0–8.0)

## 2015-02-07 LAB — URINE MICROSCOPIC-ADD ON

## 2015-02-07 LAB — APTT: APTT: 38 s — AB (ref 24–37)

## 2015-02-07 LAB — CBC WITH DIFFERENTIAL/PLATELET
BASOS ABS: 0 10*3/uL (ref 0.0–0.1)
Basophils Relative: 0 %
EOS PCT: 0 %
Eosinophils Absolute: 0 10*3/uL (ref 0.0–0.7)
HEMATOCRIT: 32.8 % — AB (ref 39.0–52.0)
Hemoglobin: 11 g/dL — ABNORMAL LOW (ref 13.0–17.0)
LYMPHS ABS: 1.5 10*3/uL (ref 0.7–4.0)
LYMPHS PCT: 9 %
MCH: 32.9 pg (ref 26.0–34.0)
MCHC: 33.5 g/dL (ref 30.0–36.0)
MCV: 98.2 fL (ref 78.0–100.0)
Monocytes Absolute: 1.4 10*3/uL — ABNORMAL HIGH (ref 0.1–1.0)
Monocytes Relative: 8 %
NEUTROS ABS: 13.5 10*3/uL — AB (ref 1.7–7.7)
Neutrophils Relative %: 83 %
Platelets: 237 10*3/uL (ref 150–400)
RBC: 3.34 MIL/uL — AB (ref 4.22–5.81)
RDW: 12.2 % (ref 11.5–15.5)
WBC: 16.4 10*3/uL — AB (ref 4.0–10.5)

## 2015-02-07 LAB — GLUCOSE, CAPILLARY
GLUCOSE-CAPILLARY: 301 mg/dL — AB (ref 65–99)
GLUCOSE-CAPILLARY: 65 mg/dL (ref 65–99)
GLUCOSE-CAPILLARY: 87 mg/dL (ref 65–99)
GLUCOSE-CAPILLARY: 255 mg/dL — AB (ref 65–99)
Glucose-Capillary: 235 mg/dL — ABNORMAL HIGH (ref 65–99)
Glucose-Capillary: 54 mg/dL — ABNORMAL LOW (ref 65–99)
Glucose-Capillary: 276 mg/dL — ABNORMAL HIGH (ref 65–99)
Glucose-Capillary: 311 mg/dL — ABNORMAL HIGH (ref 65–99)

## 2015-02-07 LAB — MRSA PCR SCREENING: MRSA by PCR: POSITIVE — AB

## 2015-02-07 LAB — PROCALCITONIN: PROCALCITONIN: 2.61 ng/mL

## 2015-02-07 LAB — CK: CK TOTAL: 163 U/L (ref 49–397)

## 2015-02-07 LAB — SEDIMENTATION RATE: Sed Rate: 73 mm/hr — ABNORMAL HIGH (ref 0–16)

## 2015-02-07 LAB — LACTIC ACID, PLASMA: LACTIC ACID, VENOUS: 1.4 mmol/L (ref 0.5–2.0)

## 2015-02-07 LAB — C-REACTIVE PROTEIN: CRP: 8.2 mg/dL — ABNORMAL HIGH (ref <1.0)

## 2015-02-07 MED ORDER — INSULIN DETEMIR 100 UNIT/ML ~~LOC~~ SOLN
25.0000 [IU] | Freq: Every day | SUBCUTANEOUS | Status: DC
  Administered 2015-02-07 – 2015-02-08 (×2): 25 [IU] via SUBCUTANEOUS
  Filled 2015-02-07 (×3): qty 0.25

## 2015-02-07 MED ORDER — SODIUM CHLORIDE 0.9 % IV SOLN
INTRAVENOUS | Status: DC
  Administered 2015-02-07: 800 mL via INTRAVENOUS

## 2015-02-07 MED ORDER — LABETALOL HCL 100 MG PO TABS
100.0000 mg | ORAL_TABLET | Freq: Two times a day (BID) | ORAL | Status: DC
  Administered 2015-02-07 – 2015-02-09 (×5): 100 mg via ORAL
  Filled 2015-02-07 (×5): qty 1

## 2015-02-07 MED ORDER — INSULIN DETEMIR 100 UNIT/ML FLEXPEN: 25.0000 [IU] | PEN_INJECTOR | Freq: Every day | SUBCUTANEOUS | Status: DC

## 2015-02-07 MED ORDER — MUPIROCIN 2 % EX OINT
1.0000 "application " | TOPICAL_OINTMENT | Freq: Two times a day (BID) | CUTANEOUS | Status: DC
  Administered 2015-02-07 – 2015-02-09 (×5): 1 via NASAL
  Filled 2015-02-07 (×4): qty 22

## 2015-02-07 MED ORDER — GABAPENTIN 600 MG PO TABS
600.0000 mg | ORAL_TABLET | Freq: Three times a day (TID) | ORAL | Status: DC
  Administered 2015-02-07 – 2015-02-09 (×8): 600 mg via ORAL
  Filled 2015-02-07 (×8): qty 1

## 2015-02-07 MED ORDER — MORPHINE SULFATE ER 30 MG PO TBCR
60.0000 mg | EXTENDED_RELEASE_TABLET | Freq: Three times a day (TID) | ORAL | Status: DC
  Administered 2015-02-07 – 2015-02-09 (×9): 60 mg via ORAL
  Filled 2015-02-07 (×3): qty 2
  Filled 2015-02-07: qty 4
  Filled 2015-02-07: qty 2
  Filled 2015-02-07: qty 4
  Filled 2015-02-07 (×2): qty 2
  Filled 2015-02-07: qty 4

## 2015-02-07 MED ORDER — CHLORHEXIDINE GLUCONATE CLOTH 2 % EX PADS
6.0000 | MEDICATED_PAD | Freq: Every day | CUTANEOUS | Status: DC
  Administered 2015-02-07 – 2015-02-08 (×2): 6 via TOPICAL

## 2015-02-07 MED ORDER — HEPARIN SODIUM (PORCINE) 5000 UNIT/ML IJ SOLN
5000.0000 [IU] | Freq: Three times a day (TID) | INTRAMUSCULAR | Status: DC
  Administered 2015-02-07 – 2015-02-09 (×8): 5000 [IU] via SUBCUTANEOUS
  Filled 2015-02-07 (×8): qty 1

## 2015-02-07 MED ORDER — POLYETHYLENE GLYCOL 3350 17 G PO PACK
17.0000 g | PACK | Freq: Every day | ORAL | Status: DC
  Filled 2015-02-07 (×2): qty 1

## 2015-02-07 MED ORDER — ASPIRIN EC 81 MG PO TBEC
81.0000 mg | DELAYED_RELEASE_TABLET | Freq: Every day | ORAL | Status: DC
  Administered 2015-02-07 – 2015-02-09 (×3): 81 mg via ORAL
  Filled 2015-02-07 (×3): qty 1

## 2015-02-07 MED ORDER — SENNA 8.6 MG PO TABS
2.0000 | ORAL_TABLET | Freq: Every day | ORAL | Status: DC
  Administered 2015-02-07 – 2015-02-08 (×2): 17.2 mg via ORAL
  Filled 2015-02-07 (×2): qty 2

## 2015-02-07 MED ORDER — ACETAMINOPHEN 325 MG PO TABS
650.0000 mg | ORAL_TABLET | Freq: Four times a day (QID) | ORAL | Status: DC | PRN
  Administered 2015-02-07: 650 mg via ORAL
  Filled 2015-02-07: qty 2

## 2015-02-07 MED ORDER — ONDANSETRON HCL 4 MG/2ML IJ SOLN: 4.0000 mg | Freq: Four times a day (QID) | INTRAMUSCULAR | Status: DC | PRN

## 2015-02-07 MED ORDER — ACETAMINOPHEN 650 MG RE SUPP
650.0000 mg | Freq: Four times a day (QID) | RECTAL | Status: DC | PRN
  Filled 2015-02-07: qty 1

## 2015-02-07 MED ORDER — INSULIN ASPART 100 UNIT/ML ~~LOC~~ SOLN
0.0000 [IU] | SUBCUTANEOUS | Status: DC
  Administered 2015-02-07: 5 [IU] via SUBCUTANEOUS
  Administered 2015-02-07: 8 [IU] via SUBCUTANEOUS
  Administered 2015-02-07: 11 [IU] via SUBCUTANEOUS
  Administered 2015-02-07: 8 [IU] via SUBCUTANEOUS
  Administered 2015-02-07: 11 [IU] via SUBCUTANEOUS
  Administered 2015-02-08: 2 [IU] via SUBCUTANEOUS
  Administered 2015-02-08: 8 [IU] via SUBCUTANEOUS

## 2015-02-07 MED ORDER — ONDANSETRON HCL 4 MG PO TABS: 4.0000 mg | ORAL_TABLET | Freq: Four times a day (QID) | ORAL | Status: DC | PRN

## 2015-02-07 MED ORDER — HYDRALAZINE HCL 20 MG/ML IJ SOLN: 10.0000 mg | Freq: Four times a day (QID) | INTRAMUSCULAR | Status: DC | PRN

## 2015-02-07 MED ORDER — SODIUM CHLORIDE 0.9 % IJ SOLN
3.0000 mL | Freq: Two times a day (BID) | INTRAMUSCULAR | Status: DC
  Administered 2015-02-07 – 2015-02-09 (×5): 3 mL via INTRAVENOUS

## 2015-02-07 MED ORDER — ALPRAZOLAM 0.5 MG PO TABS
1.0000 mg | ORAL_TABLET | Freq: Every day | ORAL | Status: DC | PRN
  Administered 2015-02-09: 1 mg via ORAL
  Filled 2015-02-07: qty 2

## 2015-02-07 NOTE — Progress Notes (Signed)
Attempted to call report  To 5W nurse was not available at this time and she would return my call . Patient also requested to not be transferred until he eats his dinner he is afraid that during the transfer he may not get his dinner on  Time . Will give report to nurse when she calls back and transfer patient after he eats.

## 2015-02-07 NOTE — Progress Notes (Signed)
Pt transferred to room 5W29 in stable condition. Alert and oriented. Pleasant and cooperative with staff. Callus to R foot open to air. Pt oriented to room and call light.

## 2015-02-07 NOTE — Progress Notes (Signed)
Hypoglycemic Event  CBG: 54  Treatment: 15 GM carbohydrate snack  Symptoms: Hungry  Follow-up CBG: Time: 0850 and 0900 CBG Result: 65 then 87  Possible Reasons for Event: Inadequate meal intake  MD at bedside, diet ordered for pt, breakfast try on the way  Previtte, Meda Klinefelter  Remember to initiate Hypoglycemia Order Set & complete

## 2015-02-07 NOTE — Progress Notes (Signed)
Inpatient Diabetes Program Recommendations  AACE/ADA: New Consensus Statement on Inpatient Glycemic Control (2015)  Target Ranges:  Prepandial:   less than 140 mg/dL      Peak postprandial:   less than 180 mg/dL (1-2 hours)      Critically ill patients:  140 - 180 mg/dL   Review of Glycemic Control  Diabetes history: DM 2 Outpatient Diabetes medications: Levemir 55 units QHS Current orders for Inpatient glycemic control: Levemir 25 units QHS, Novolog Moderate Correction scale Q4hrs  Note: Patient's renal function is elevated, Creat 1.69, BUN 24, Patient received a total of 16 units for a glucose in the 300's when first admitted. Patient had hypoglycemia of 54 at 0829 this am. Agree with current inpatient medication regimen and will watch trends while here.  Thanks,  Tama Headings RN, MSN, [facility] Inpatient Diabetes Coordinator Team Pager [phone] (8a-5p)

## 2015-02-07 NOTE — Care Management Note (Signed)
Case Management Note  Patient Details  Name: David [name] MRN: [id number] Date of Birth: 1946/02/10  Subjective/Objective:         Adm w sepsis          Action/Plan: lives w fam, pcp dr kilpatrick   Expected Discharge Date:                  Expected Discharge Plan:  [name]  In-House Referral:     Discharge planning Services     Post Acute Care Choice:    Choice offered to:     DME Arranged:    DME Agency:     HH Arranged:    [place] Agency:     Status of Service:     Medicare Important Message Given:    Date Medicare IM Given:    Medicare IM give by:    Date Additional Medicare IM Given:    Additional Medicare Important Message give by:     If discussed at [place] of Stay Meetings, dates discussed:    Additional Comments: ur review  Lacretia Leigh, RN 02/07/2015, 8:03 AM

## 2015-02-07 NOTE — Progress Notes (Signed)
Pt seen full consult to follow Right foot with callus Mri no abcess or osteo No or at this time  Ok to eat Will trim cullus before he leaves Full consult to follow

## 2015-02-07 NOTE — Progress Notes (Signed)
Report given 5w.  

## 2015-02-07 NOTE — H&P (Signed)
[facility] History and Physical  Patient: David [name]  MRN: [id number]  DOB: 11-Nov-1945  DOS: the patient was seen and examined on 02/07/2015 PCP: Leola Brazil, MD  Referring physician: Dr Robina Ade Chief Complaint: Right foot ulcer  HPI: JAMONIE MENSE is a 69 y.o. male with Past medical history of diabetes mellitus, hypertension, glaucoma. The patient presents with complains of right foot ulcer. Patient mentions that one month ago he has noted a callus. 5 days ago he started having fever with fatigue. He started noticing some dryness coming out from the ulcer today and therefore he decided to see his PCP recommended him to come to the ER. His temperature at home was 102. He started having some chills as well. He was 40 and diaphoretic as well. He denies any chest pain and abdominal pain nausea vomiting or diarrhea. Denies any burning urination. Denies any recent change in his medications. Orthopedic was consulted who recommended an MRI and then reconsulted on the patient. No urgent surgical intervention recommended from orthopedics Dr. Marlou Sa.  The patient is coming from home.  At his baseline ambulates with cane And is independent for most of his ADL; manages his medication on his own.  Review of Systems: as mentioned in the history of present illness.  A comprehensive review of the other systems is negative.  Past Medical History  Diagnosis Date  . Diabetes mellitus without complication   . Hypertension   . Glaucoma    Past Surgical History  Procedure Laterality Date  . Joint replacement    . Back surgery    . Cyst excision      on Back  . Total hip arthroplasty      Right    Social History:  reports that he has never smoked. He does not have any smokeless tobacco history on file. He reports that he does not drink alcohol or use illicit drugs.  No Known Allergies  Family History  Problem Relation Age of Onset  . Diabetes Other   .  Hyperlipidemia Other   . Hypertension Other   . Stroke Other   . Alzheimer's disease Other     Prior to Admission medications   Medication Sig Start Date End Date Taking? Authorizing Quinlin Conant  ALPRAZolam Duanne Moron) 1 MG tablet Take 1 mg by mouth daily as needed for anxiety.  05/23/13  Yes Historical Cathlyn Tersigni, MD  amLODipine (NORVASC) 10 MG tablet Take 1 tablet (10 mg total) by mouth daily. 09/17/13  Yes Simbiso Ranga, MD  aspirin EC 81 MG tablet Take 81 mg by mouth daily.   Yes Historical Cortny Bambach, MD  bimatoprost (LUMIGAN) 0.03 % ophthalmic solution Place 1 drop into both eyes at bedtime.   Yes Historical Luvern Mcisaac, MD  carvedilol (COREG) 12.5 MG tablet Take 12.5 mg by mouth 2 (two) times daily with a meal.   Yes Historical Silvina Hackleman, MD  cetirizine (ZYRTEC) 10 MG tablet Take 10 mg by mouth daily.   Yes Historical Alain Deschene, MD  gabapentin (NEURONTIN) 600 MG tablet Take 600 mg by mouth 3 (three) times daily. 01/31/15  Yes Historical [name] Stainback, MD  labetalol (NORMODYNE) 100 MG tablet Take 1 tablet (100 mg total) by mouth 2 (two) times daily. 10/09/13  Yes Wandra Arthurs, MD  LEVEMIR FLEXTOUCH 100 UNIT/ML Pen Inject 55 Units into the skin at bedtime.  08/04/13  Yes Historical Porter Nakama, MD  losartan (COZAAR) 100 MG tablet Take 100 mg by mouth daily.   Yes Historical Daire Okimoto, MD  morphine (MS  CONTIN) 60 MG 12 hr tablet Take 60 mg by mouth every 8 (eight) hours. 12/28/14  Yes Historical Kavin Weckwerth, MD  Multiple Vitamin (MULTI-VITAMIN PO) Take 1 tablet by mouth daily.   Yes Historical Hailey Stormer, MD  Multiple Vitamins-Minerals (CENTRUM SILVER ADULT 50+) TABS Take 1 tablet by mouth daily.   Yes Historical Lamiyah Schlotter, MD  Tetrahydrozoline HCl (EYE DROPS OP) Apply 2 drops to eye 3 (three) times daily as needed (for dry eyes).   Yes Historical Khyran Riera, MD  Hydrocodone-Acetaminophen 7.5-300 MG TABS Take 1 tablet by mouth 2 (two) times daily as needed (for pain).  07/22/13   Historical Tulio Facundo, MD    Physical Exam: Filed  Vitals:   02/06/15 2355 02/07/15 0100 02/07/15 0107 02/07/15 0412  BP: 123/87 133/77  113/53  Pulse: 77 79  67  Temp: 99.3 F (37.4 C)   98.7 F (37.1 C)  TempSrc: Oral   Oral  Resp: 17 14  14   Height:   6' (1.829 m)   Weight:   102.2 kg (225 lb 5 oz)   SpO2: 96% 96%  99%    General: Alert, Awake and Oriented to Time, Place and Person. Appear in mild distress Eyes: PERRL ENT: Oral Mucosa clear moist. Neck: no JVD Cardiovascular: S1 and S2 Present, no Murmur, Peripheral Pulses Present Respiratory: Bilateral Air entry equal and Decreased,  Clear to Auscultation, no Crackles, no wheezes Abdomen: Bowel Sound present, Soft and no tenderness Skin: no Rash Extremities: no Pedal edema, no calf tenderness Large right foot ulcer Neurologic: Grossly no focal neuro deficit.  Labs on Admission:  CBC:  Recent Labs Lab 02/06/15 1935 02/07/15 0106  WBC 17.6* 16.4*  NEUTROABS 15.5* 13.5*  HGB 12.5* 11.0*  HCT 36.5* 32.8*  MCV 97.1 98.2  PLT 255 237    CMP     Component Value Date/Time   NA 134* 02/07/2015 0106   K 3.9 02/07/2015 0106   CL 98* 02/07/2015 0106   CO2 28 02/07/2015 0106   GLUCOSE 327* 02/07/2015 0106   BUN 24* 02/07/2015 0106   CREATININE 1.69* 02/07/2015 0106   CALCIUM 8.2* 02/07/2015 0106   PROT 6.9 02/07/2015 0106   ALBUMIN 3.1* 02/07/2015 0106   AST 19 02/07/2015 0106   ALT 21 02/07/2015 0106   ALKPHOS 59 02/07/2015 0106   BILITOT 0.7 02/07/2015 0106   GFRNONAA 40* 02/07/2015 0106   GFRAA 47* 02/07/2015 0106     Recent Labs Lab 02/07/15 0106  CKTOTAL 163   BNP (last 3 results) No results for input(s): BNP in the last 8760 hours.  ProBNP (last 3 results) No results for input(s): PROBNP in the last 8760 hours.   Radiological Exams on Admission: Dg Foot Complete Right  02/06/2015   CLINICAL DATA:  Wound tube ball of right foot x2 days  EXAM: RIGHT FOOT COMPLETE - 3+ VIEW  COMPARISON:  01/08/2015  FINDINGS: Gas and soft tissue swelling  overlies the fifth metatarsal phalangeal joint. No underlying bone erosion identified. No acute fracture or subluxation identified. Second through fifth hammertoe deformities are noted.  IMPRESSION: 1. Soft tissue swelling and gas overlies the fifth MTP joint. No acute bone abnormality noted.   Electronically Signed   By: Kerby Moors M.D.   On: 02/06/2015 19:37   Assessment/Plan 1. Sepsis due to cellulitis The patient is presenting with complaint of right foot ulcer. The 40s: But no significant tenderness. No loss of pulse. X-ray showed a subcutaneous gas MRI is pending. Neuro-orthopedic has been already consulted.  Patient remains nothing by mouth at present. Patient has tachycardia lactic acidosis as well as acute on chronic kidney disease. Thus meeting the criteria for sepsis. Along with fever. We will treat him broadly with vancomycin and Zosyn and follow the cultures.  2Hypertension Continue home blood pressure medications. Holding losartan at present in the setting of mild renal insufficiency.  3 Chronic back pain Continuing home pain medications.  4. Diabetes mellitus Placing the patient on sliding scale every 4 hours. Gentle IV hydration.  5.CKD (chronic kidney disease) Mild worsening from baseline serum creatinine. Most likely the setting of infection as well as diabetic nephropathy. Monitor renal function pharmacy consultation.  6. Hyponatremia likely secondary to hyperglycemia. We will recheck the labs.  Nutrition: Nothing by mouth except medications DVT Prophylaxis: subcutaneous Heparin  Advance goals of care discussion: Full code   Consults: Orthopedic Dr. Marlou Sa Family Communication: family was present at bedside, opportunity was given to ask question and all questions were answered satisfactorily at the time of interview. Disposition: Admitted as inpatient, step-down unit.  Author: Berle Mull, MD [facility] Pager: [phone] 02/07/2015  If  7PM-7AM, please contact night-coverage [email or web address] Password [email or web address]

## 2015-02-07 NOTE — Consult Note (Signed)
Reason for Consult: Right great toe callus Referring Physician: Dr David [name] is an 69 y.o. male.  HPI: David [name] is a 69 year old patient with one-week history of right great toe plantar ulcer which started out as a blood blister developed into a calcified. Presented to the hospital with sepsis. Admitted to the medical service. Spent 1 night in stepdown for observation. MRI scan of the foot demonstrates no abscess no osteomyelitis. Currently has improved on IV anabiotic's.  Past Medical History  Diagnosis Date  . Diabetes mellitus without complication   . Hypertension   . Glaucoma     Past Surgical History  Procedure Laterality Date  . Joint replacement    . Back surgery    . Cyst excision      on Back  . Total hip arthroplasty      Right     Family History  Problem Relation Age of Onset  . Diabetes Other   . Hyperlipidemia Other   . Hypertension Other   . Stroke Other   . Alzheimer's disease Other     Social History:  reports that he has never smoked. He does not have any smokeless tobacco history on file. He reports that he does not drink alcohol or use illicit drugs.  Allergies: No Known Allergies  Medications: I have reviewed the patient's current medications.  Results for orders placed or performed during the hospital encounter of 02/06/15 (from the past 48 hour(s))  CBG monitoring, ED     Status: Abnormal   Collection Time: 02/06/15  6:25 PM  Result Value Ref Range   Glucose-Capillary 400 (H) 65 - 99 mg/dL  Blood Culture (routine x 2)     Status: None (Preliminary result)   Collection Time: 02/06/15  7:01 PM  Result Value Ref Range   Specimen Description BLOOD RIGHT ARM    Special Requests BOTTLES DRAWN AEROBIC AND ANAEROBIC 10ML    Culture NO GROWTH < 24 HOURS    Report Status PENDING   I-Stat CG4 Lactic Acid, ED  (not at [facility])     Status: Abnormal   Collection Time: 02/06/15  7:15 PM  Result Value Ref Range   Lactic Acid, Venous 2.74 (HH) 0.5  - 2.0 mmol/L   Comment NOTIFIED PHYSICIAN   Comprehensive metabolic panel     Status: Abnormal   Collection Time: 02/06/15  7:35 PM  Result Value Ref Range   Sodium 129 (L) 135 - 145 mmol/L   Potassium 3.7 3.5 - 5.1 mmol/L   Chloride 90 (L) 101 - 111 mmol/L   CO2 27 22 - 32 mmol/L   Glucose, Bld 386 (H) 65 - 99 mg/dL   BUN 22 (H) 6 - 20 mg/dL   Creatinine, Ser 1.84 (H) 0.61 - 1.24 mg/dL   Calcium 9.2 8.9 - 10.3 mg/dL   Total Protein 8.6 (H) 6.5 - 8.1 g/dL   Albumin 3.8 3.5 - 5.0 g/dL   AST 29 15 - 41 U/L   ALT 25 17 - 63 U/L   Alkaline Phosphatase 71 38 - 126 U/L   Total Bilirubin 0.8 0.3 - 1.2 mg/dL   GFR calc non Af Amer 36 (L) >60 mL/min   GFR calc Af Amer 42 (L) >60 mL/min    Comment: (NOTE) The eGFR has been calculated using the CKD EPI equation. This calculation has not been validated in all clinical situations. eGFR's persistently <60 mL/min signify possible Chronic Kidney Disease.    Anion gap 12 5 -  15  CBC with Differential     Status: Abnormal   Collection Time: 02/06/15  7:35 PM  Result Value Ref Range   WBC 17.6 (H) 4.0 - 10.5 K/uL   RBC 3.76 (L) 4.22 - 5.81 MIL/uL   Hemoglobin 12.5 (L) 13.0 - 17.0 g/dL   HCT 36.5 (L) 39.0 - 52.0 %   MCV 97.1 78.0 - 100.0 fL   MCH 33.2 26.0 - 34.0 pg   MCHC 34.2 30.0 - 36.0 g/dL   RDW 12.1 11.5 - 15.5 %   Platelets 255 150 - 400 K/uL   Neutrophils Relative % 88 %   Neutro Abs 15.5 (H) 1.7 - 7.7 K/uL   Lymphocytes Relative 4 %   Lymphs Abs 0.7 0.7 - 4.0 K/uL   Monocytes Relative 8 %   Monocytes Absolute 1.4 (H) 0.1 - 1.0 K/uL   Eosinophils Relative 0 %   Eosinophils Absolute 0.0 0.0 - 0.7 K/uL   Basophils Relative 0 %   Basophils Absolute 0.0 0.0 - 0.1 K/uL  Blood Culture (routine x 2)     Status: None (Preliminary result)   Collection Time: 02/06/15  8:29 PM  Result Value Ref Range   Specimen Description BLOOD RIGHT ARM    Special Requests BOTTLES DRAWN AEROBIC AND ANAEROBIC 5CC    Culture NO GROWTH < 24 HOURS     Report Status PENDING   I-Stat CG4 Lactic Acid, ED  (not at  [facility])     Status: Abnormal   Collection Time: 02/06/15  8:34 PM  Result Value Ref Range   Lactic Acid, Venous 2.75 (HH) 0.5 - 2.0 mmol/L   Comment NOTIFIED PHYSICIAN   Urinalysis, Routine w reflex microscopic (not at [facility])     Status: Abnormal   Collection Time: 02/07/15 12:14 AM  Result Value Ref Range   Color, Urine AMBER (A) YELLOW    Comment: BIOCHEMICALS MAY BE AFFECTED BY COLOR   APPearance CLEAR CLEAR   Specific Gravity, Urine 1.030 1.005 - 1.030   pH 5.0 5.0 - 8.0   Glucose, UA >1000 (A) NEGATIVE mg/dL   Hgb urine dipstick NEGATIVE NEGATIVE   Bilirubin Urine NEGATIVE NEGATIVE   Ketones, ur 15 (A) NEGATIVE mg/dL   Protein, ur 100 (A) NEGATIVE mg/dL   Urobilinogen, UA 1.0 0.0 - 1.0 mg/dL   Nitrite NEGATIVE NEGATIVE   Leukocytes, UA NEGATIVE NEGATIVE  Urine microscopic-add on     Status: Abnormal   Collection Time: 02/07/15 12:14 AM  Result Value Ref Range   Squamous Epithelial / LPF FEW (A) RARE   WBC, UA 0-2 <3 WBC/hpf   RBC / HPF 0-2 <3 RBC/hpf   Bacteria, UA FEW (A) RARE   Casts HYALINE CASTS (A) NEGATIVE   Crystals CA OXALATE CRYSTALS (A) NEGATIVE   Urine-Other MUCOUS PRESENT   MRSA PCR Screening     Status: Abnormal   Collection Time: 02/07/15 12:17 AM  Result Value Ref Range   MRSA by PCR POSITIVE (A) NEGATIVE    Comment:        The GeneXpert MRSA Assay (FDA approved for NASAL specimens only), is one component of a comprehensive MRSA colonization surveillance program. It is not intended to diagnose MRSA infection nor to guide or monitor treatment for MRSA infections. RESULT CALLED TO, READ BACK BY AND VERIFIED WITH: ABBY STOPHEL _0  02/07/15 MKELLY   Lactic acid, plasma     Status: None   Collection Time: 02/07/15 12:48 AM  Result Value Ref Range  Lactic Acid, Venous 1.4 0.5 - 2.0 mmol/L  CBC with Differential     Status: Abnormal   Collection Time: 02/07/15  1:06 AM  Result Value Ref  Range   WBC 16.4 (H) 4.0 - 10.5 K/uL   RBC 3.34 (L) 4.22 - 5.81 MIL/uL   Hemoglobin 11.0 (L) 13.0 - 17.0 g/dL   HCT 32.8 (L) 39.0 - 52.0 %   MCV 98.2 78.0 - 100.0 fL   MCH 32.9 26.0 - 34.0 pg   MCHC 33.5 30.0 - 36.0 g/dL   RDW 12.2 11.5 - 15.5 %   Platelets 237 150 - 400 K/uL   Neutrophils Relative % 83 %   Neutro Abs 13.5 (H) 1.7 - 7.7 K/uL   Lymphocytes Relative 9 %   Lymphs Abs 1.5 0.7 - 4.0 K/uL   Monocytes Relative 8 %   Monocytes Absolute 1.4 (H) 0.1 - 1.0 K/uL   Eosinophils Relative 0 %   Eosinophils Absolute 0.0 0.0 - 0.7 K/uL   Basophils Relative 0 %   Basophils Absolute 0.0 0.0 - 0.1 K/uL  Comprehensive metabolic panel     Status: Abnormal   Collection Time: 02/07/15  1:06 AM  Result Value Ref Range   Sodium 134 (L) 135 - 145 mmol/L   Potassium 3.9 3.5 - 5.1 mmol/L   Chloride 98 (L) 101 - 111 mmol/L   CO2 28 22 - 32 mmol/L   Glucose, Bld 327 (H) 65 - 99 mg/dL   BUN 24 (H) 6 - 20 mg/dL   Creatinine, Ser 1.69 (H) 0.61 - 1.24 mg/dL   Calcium 8.2 (L) 8.9 - 10.3 mg/dL   Total Protein 6.9 6.5 - 8.1 g/dL   Albumin 3.1 (L) 3.5 - 5.0 g/dL   AST 19 15 - 41 U/L   ALT 21 17 - 63 U/L   Alkaline Phosphatase 59 38 - 126 U/L   Total Bilirubin 0.7 0.3 - 1.2 mg/dL   GFR calc non Af Amer 40 (L) >60 mL/min   GFR calc Af Amer 47 (L) >60 mL/min    Comment: (NOTE) The eGFR has been calculated using the CKD EPI equation. This calculation has not been validated in all clinical situations. eGFR's persistently <60 mL/min signify possible Chronic Kidney Disease.    Anion gap 8 5 - 15  Procalcitonin     Status: None   Collection Time: 02/07/15  1:06 AM  Result Value Ref Range   Procalcitonin 2.61 ng/mL    Comment:        Interpretation: PCT > 2 ng/mL: Systemic infection (sepsis) is likely, unless other causes are known. (NOTE)         ICU PCT Algorithm               Non ICU PCT Algorithm    ----------------------------     ------------------------------         PCT < 0.25 ng/mL                  PCT < 0.1 ng/mL     Stopping of antibiotics            Stopping of antibiotics       strongly encouraged.               strongly encouraged.    ----------------------------     ------------------------------       PCT level decrease by               PCT <  0.25 ng/mL       >= 80% from peak PCT       OR PCT 0.25 - 0.5 ng/mL          Stopping of antibiotics                                             encouraged.     Stopping of antibiotics           encouraged.    ----------------------------     ------------------------------       PCT level decrease by              PCT >= 0.25 ng/mL       < 80% from peak PCT        AND PCT >= 0.5 ng/mL            Continuing antibiotics                                               encouraged.       Continuing antibiotics            encouraged.    ----------------------------     ------------------------------     PCT level increase compared          PCT > 0.5 ng/mL         with peak PCT AND          PCT >= 0.5 ng/mL             Escalation of antibiotics                                          strongly encouraged.      Escalation of antibiotics        strongly encouraged.   Protime-INR     Status: Abnormal   Collection Time: 02/07/15  1:06 AM  Result Value Ref Range   Prothrombin Time 15.6 (H) 11.6 - 15.2 seconds   INR 1.23 0.00 - 1.49  APTT     Status: Abnormal   Collection Time: 02/07/15  1:06 AM  Result Value Ref Range   aPTT 38 (H) 24 - 37 seconds    Comment:        IF BASELINE aPTT IS ELEVATED, SUGGEST PATIENT RISK ASSESSMENT BE USED TO DETERMINE APPROPRIATE ANTICOAGULANT THERAPY.   CK     Status: None   Collection Time: 02/07/15  1:06 AM  Result Value Ref Range   Total CK 163 49 - 397 U/L  Sedimentation rate     Status: Abnormal   Collection Time: 02/07/15  1:06 AM  Result Value Ref Range   Sed Rate 73 (H) 0 - 16 mm/hr  C-reactive protein     Status: Abnormal   Collection Time: 02/07/15  1:06 AM  Result Value  Ref Range   CRP 8.2 (H) <1.0 mg/dL  Glucose, capillary     Status: Abnormal   Collection Time: 02/07/15  1:17 AM  Result Value Ref Range   Glucose-Capillary 301 (H) 65 - 99 mg/dL   Comment 1 Capillary Specimen   Glucose, capillary  Status: Abnormal   Collection Time: 02/07/15  4:09 AM  Result Value Ref Range   Glucose-Capillary 235 (H) 65 - 99 mg/dL   Comment 1 Capillary Specimen   Glucose, capillary     Status: Abnormal   Collection Time: 02/07/15  8:29 AM  Result Value Ref Range   Glucose-Capillary 54 (L) 65 - 99 mg/dL   Comment 1 Capillary Specimen    Comment 2 Notify RN   Glucose, capillary     Status: None   Collection Time: 02/07/15  8:50 AM  Result Value Ref Range   Glucose-Capillary 65 65 - 99 mg/dL   Comment 1 Capillary Specimen   Glucose, capillary     Status: None   Collection Time: 02/07/15  9:09 AM  Result Value Ref Range   Glucose-Capillary 87 65 - 99 mg/dL   Comment 1 Capillary Specimen   Glucose, capillary     Status: Abnormal   Collection Time: 02/07/15 12:06 PM  Result Value Ref Range   Glucose-Capillary 255 (H) 65 - 99 mg/dL   Comment 1 Capillary Specimen   Glucose, capillary     Status: Abnormal   Collection Time: 02/07/15  4:49 PM  Result Value Ref Range   Glucose-Capillary 276 (H) 65 - 99 mg/dL   Comment 1 Capillary Specimen     Mr Foot Right Wo Contrast  02/07/2015   CLINICAL DATA:  Diabetic foot infection. Soft tissue infection of the foot. Initial encounter.  EXAM: MRI OF THE RIGHT FOREFOOT WITHOUT CONTRAST  TECHNIQUE: Multiplanar, multisequence MR imaging was performed. No intravenous contrast was administered.  COMPARISON:  02/06/2015.  FINDINGS: Previous radiographs demonstrated and ulceration over the fifth MTP joint.  On MRI, this area shows infiltration of the subcutaneous tissues however there is no osteomyelitis. No erosion of the cortex of either the fifth metatarsal or the proximal phalanx of the small toe. The ulceration is visible.  There is no soft tissue abscess. 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 Subcutaneous edema in the forefoot compatible with cellulitis in the appropriate clinical setting. Flexor and extensor tendons appear within normal limits. The toes are flexed at the time of imaging.  IMPRESSION: Ulceration over the lateral plantar aspect of the fifth MTP joint without osteomyelitis or soft tissue abscess.   Electronically Signed   By: Dereck Ligas M.D.   On: 02/07/2015 07:45   Dg Foot Complete Right  02/06/2015   CLINICAL DATA:  Wound tube ball of right foot x2 days  EXAM: RIGHT FOOT COMPLETE - 3+ VIEW  COMPARISON:  01/08/2015  FINDINGS: Gas and soft tissue swelling overlies the fifth metatarsal phalangeal joint. No underlying bone erosion identified. No acute fracture or subluxation identified. Second through fifth hammertoe deformities are noted.  IMPRESSION: 1. Soft tissue swelling and gas overlies the fifth MTP joint. No acute bone abnormality noted.   Electronically Signed   By: Kerby Moors M.D.   On: 02/06/2015 19:37    Review of Systems  Constitutional: Positive for fever.  HENT: Negative.   Eyes: Negative.   Respiratory: Negative.   Cardiovascular: Negative.   Gastrointestinal: Negative.   Genitourinary: Negative.   Musculoskeletal: Negative.   Skin: Negative.   Neurological: Negative.   Endo/Heme/Allergies: Negative.   Psychiatric/Behavioral: Negative.    Blood pressure 122/57, pulse 84, temperature 98.7 F (37.1 C), temperature source Oral, resp. rate 17, height 6' (1.829 m), weight 102.2 kg (225 lb 5 oz), SpO2 94 %. Physical Exam  Constitutional: He appears well-developed.  HENT:  Head: Normocephalic.  Eyes: Pupils are equal, round, and reactive to light.  Neck: Normal range of motion.  Cardiovascular: Normal rate.   Respiratory: Effort normal.  Neurological: He is alert.  Skin: Skin is warm.  Psychiatric: He has a normal mood and affect.   examination the right foot  demonstrates somewhat diminished sensation dorsum plantar foot but in general he can he does have protective sensation is palpable pedal pulses does have a callus over the plantar MTP joint. There is no fluctuance erythema tenderness to palpation. No dorsal swelling or plantar swelling is present. Palpable intact nontender anterior tib posterior tib peroneal and Achilles tendon  Assessment/Plan: MRI scan shows no abscess no osteomyelitis impression right great toe callus plantar aspect no evidence of infection or indication for surgical treatment at this time I do plan on trimming the callus prior to discharge. Once he is medically stable he should follow-up with [name] in the clinic in 14 days for clinical recheck on the plantar aspect right great toe  DEAN,GREGORY SCOTT 02/07/2015, 6:28 PM

## 2015-02-07 NOTE — Progress Notes (Addendum)
PATIENT DETAILS Name: David [name] Age: 69 y.o. Sex: male Date of Birth: [birth year] Admit Date: 02/06/2015 Admitting Physician Lavina Hamman, MD FB:[id number] JR,GEORGE R, MD  Subjective: Feels much better.  Assessment/Plan: Principal Problem: Sepsis due to diabetic foot: Sepsis pathophysiology has resolved, patient much improved. Continue empiric IV antibiotics, await further recommendations from orthopedics.MRI right foot negative for osteomyelitis or abscess. Follow cultures.  Active Problems: Acute on chronic kidney disease stage III: Acute renal failure secondary to prerenal azotemia secondary to above. Much improved with hydration, now euvolemic, stop IV fluids, encourage oral intake and follow.  Hypertension: Controlled, continue labetalol. Losartan and amlodipine on hold-resume when able. Follow and adjust accordingly  Type 2 diabetes: CBGs stable-had one episode of hypoglycemia this morning-as NPO-follow CBGs, and resume Levemir at reduced dose at bedtime.  Chronic back pain: Continue with as needed narcotics. Ensure patient on bowel regimen.  Peripheral neuropathy: Continue Neurontin, likely related to diabetes.  History of anxiety: Controlled-continue with as needed Xanax  Disposition: Remain inpatient-but transfer to [place]. Require several more days of  hospitalization  Antimicrobial agents  See below  Anti-infectives    Start     Dose/Rate Route Frequency Ordered Stop   02/07/15 2100  vancomycin (VANCOCIN) 1,250 mg in sodium chloride 0.9 % 250 mL IVPB     1,250 mg 166.7 mL/hr over 90 Minutes Intravenous Every 24 hours 02/06/15 2050     02/07/15 0300  piperacillin-tazobactam (ZOSYN) IVPB 3.375 g     3.375 g 12.5 mL/hr over 240 Minutes Intravenous Every 8 hours 02/06/15 2050     02/06/15 2015  piperacillin-tazobactam (ZOSYN) IVPB 3.375 g     3.375 g 100 mL/hr over 30 Minutes Intravenous  Once 02/06/15 2006 02/06/15 2240   02/06/15  2015  vancomycin (VANCOCIN) IVPB 1000 mg/200 mL premix  Status:  Discontinued     1,000 mg 200 mL/hr over 60 Minutes Intravenous  Once 02/06/15 2006 02/06/15 2012   02/06/15 2015  vancomycin (VANCOCIN) 2,000 mg in sodium chloride 0.9 % 500 mL IVPB     2,000 mg 250 mL/hr over 120 Minutes Intravenous  Once 02/06/15 2012 02/06/15 2240      DVT Prophylaxis: Prophylactic Heparin  Code Status: Full code   Family Communication   Procedures: None  CONSULTS:  orthopedic surgery  Time spent 30 minutes-Greater than 50% of this time was spent in counseling, explanation of diagnosis, planning of further management, and coordination of care.  MEDICATIONS: Scheduled Meds: . aspirin EC  81 mg Oral Daily  . Chlorhexidine Gluconate Cloth  6 each Topical Q0600  . gabapentin  600 mg Oral TID  . heparin  5,000 Units Subcutaneous 3 times per day  . insulin aspart  0-15 Units Subcutaneous 6 times per day  . labetalol  100 mg Oral BID  . morphine  60 mg Oral 3 times per day  . mupirocin ointment  1 application Nasal BID  . piperacillin-tazobactam (ZOSYN)  IV  3.375 g Intravenous Q8H  . sodium chloride  3 mL Intravenous Q12H  . vancomycin  1,250 mg Intravenous Q24H   Continuous Infusions: . sodium chloride Stopped (02/07/15 1009)   PRN Meds:.acetaminophen **OR** acetaminophen, ALPRAZolam, ondansetron **OR** ondansetron (ZOFRAN) IV    PHYSICAL EXAM: Vital signs in last 24 hours: Filed Vitals:   02/07/15 0831 02/07/15 0900 02/07/15 1000 02/07/15 1100  BP:  161/69 150/78 122/57  Pulse:  84 77  84  Temp: 98.9 F (37.2 C)   98.7 F (37.1 C)  TempSrc: Oral   Oral  Resp:  14 16 17   Height:      Weight:      SpO2:  94% 97% 94%    Weight change:  Filed Weights   02/07/15 0107  Weight: 102.2 kg (225 lb 5 oz)   Body mass index is 30.55 kg/(m^2).   Gen Exam: Awake and alert with clear speech.  Neck: Supple, No JVD.   Chest: B/L Clear.   CVS: S1 S2 Regular, no murmurs.  Abdomen:  soft, BS +, non tender, non distended.  Extremities: no edema, lower extremities warm to touch. Neurologic: Non Focal.  Skin: No Rash.   Wounds: see pic below   Intake/Output from previous day:  Intake/Output Summary (Last 24 hours) at 02/07/15 1247 Last data filed at 02/07/15 1100  Gross per 24 hour  Intake 1113.83 ml  Output    750 ml  Net 363.83 ml     LAB RESULTS: CBC  Recent Labs Lab 02/06/15 1935 02/07/15 0106  WBC 17.6* 16.4*  HGB 12.5* 11.0*  HCT 36.5* 32.8*  PLT 255 237  MCV 97.1 98.2  MCH 33.2 32.9  MCHC 34.2 33.5  RDW 12.1 12.2  LYMPHSABS 0.7 1.5  MONOABS 1.4* 1.4*  EOSABS 0.0 0.0  BASOSABS 0.0 0.0    Chemistries   Recent Labs Lab 02/06/15 1935 02/07/15 0106  NA 129* 134*  K 3.7 3.9  CL 90* 98*  CO2 27 28  GLUCOSE 386* 327*  BUN 22* 24*  CREATININE 1.84* 1.69*  CALCIUM 9.2 8.2*    CBG:  Recent Labs Lab 02/07/15 0409 02/07/15 0829 02/07/15 0850 02/07/15 0909 02/07/15 1206  GLUCAP 235* 54* 65 87 255*    GFR Estimated Creatinine Clearance: 52.4 mL/min (by C-G formula based on Cr of 1.69).  Coagulation profile  Recent Labs Lab 02/07/15 0106  INR 1.23    Cardiac Enzymes No results for input(s): CKMB, TROPONINI, MYOGLOBIN in the last 168 hours.  Invalid input(s): CK  Invalid input(s): POCBNP No results for input(s): DDIMER in the last 72 hours. No results for input(s): HGBA1C in the last 72 hours. No results for input(s): CHOL, HDL, LDLCALC, TRIG, CHOLHDL, LDLDIRECT in the last 72 hours. No results for input(s): TSH, T4TOTAL, T3FREE, THYROIDAB in the last 72 hours.  Invalid input(s): FREET3 No results for input(s): VITAMINB12, FOLATE, FERRITIN, TIBC, IRON, RETICCTPCT in the last 72 hours. No results for input(s): LIPASE, AMYLASE in the last 72 hours.  Urine Studies No results for input(s): UHGB, CRYS in the last 72 hours.  Invalid input(s): UACOL, UAPR, USPG, UPH, UTP, UGL, UKET, UBIL, UNIT, UROB, ULEU, UEPI, UWBC,  URBC, UBAC, CAST, UCOM, BILUA  MICROBIOLOGY: Recent Results (from the past 240 hour(s))  Blood Culture (routine x 2)     Status: None (Preliminary result)   Collection Time: 02/06/15  7:01 PM  Result Value Ref Range Status   Specimen Description BLOOD RIGHT ARM  Final   Special Requests BOTTLES DRAWN AEROBIC AND ANAEROBIC 10ML  Final   Culture NO GROWTH < 24 HOURS  Final   Report Status PENDING  Incomplete  Blood Culture (routine x 2)     Status: None (Preliminary result)   Collection Time: 02/06/15  8:29 PM  Result Value Ref Range Status   Specimen Description BLOOD RIGHT ARM  Final   Special Requests BOTTLES DRAWN AEROBIC AND ANAEROBIC 5CC  Final   Culture  NO GROWTH < 24 HOURS  Final   Report Status PENDING  Incomplete  MRSA PCR Screening     Status: Abnormal   Collection Time: 02/07/15 12:17 AM  Result Value Ref Range Status   MRSA by PCR POSITIVE (A) NEGATIVE Final    Comment:        The GeneXpert MRSA Assay (FDA approved for NASAL specimens only), is one component of a comprehensive MRSA colonization surveillance program. It is not intended to diagnose MRSA infection nor to guide or monitor treatment for MRSA infections. RESULT CALLED TO, READ BACK BY AND VERIFIED WITH: ABBY STOPHEL @0202  02/07/15 MKELLY     RADIOLOGY STUDIES/RESULTS: Mr Foot Right Wo Contrast  02/07/2015   CLINICAL DATA:  Diabetic foot infection. Soft tissue infection of the foot. Initial encounter.  EXAM: MRI OF THE RIGHT FOREFOOT WITHOUT CONTRAST  TECHNIQUE: Multiplanar, multisequence MR imaging was performed. No intravenous contrast was administered.  COMPARISON:  02/06/2015.  FINDINGS: Previous radiographs demonstrated and ulceration over the fifth MTP joint.  On MRI, this area shows infiltration of the subcutaneous tissues however there is no osteomyelitis. No erosion of the cortex of either the fifth metatarsal or the proximal phalanx of the small toe. The ulceration is visible. There is no soft  tissue abscess. 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 Subcutaneous edema in the forefoot compatible with cellulitis in the appropriate clinical setting. Flexor and extensor tendons appear within normal limits. The toes are flexed at the time of imaging.  IMPRESSION: Ulceration over the lateral plantar aspect of the fifth MTP joint without osteomyelitis or soft tissue abscess.   Electronically Signed   By: Dereck Ligas M.D.   On: 02/07/2015 07:45   Dg Foot Complete Right  02/06/2015   CLINICAL DATA:  Wound tube ball of right foot x2 days  EXAM: RIGHT FOOT COMPLETE - 3+ VIEW  COMPARISON:  01/08/2015  FINDINGS: Gas and soft tissue swelling overlies the fifth metatarsal phalangeal joint. No underlying bone erosion identified. No acute fracture or subluxation identified. Second through fifth hammertoe deformities are noted.  IMPRESSION: 1. Soft tissue swelling and gas overlies the fifth MTP joint. No acute bone abnormality noted.   Electronically Signed   By: Kerby Moors M.D.   On: 02/06/2015 19:37   Dg Foot Complete Right  01/09/2015   CLINICAL DATA:  Right little toe pain and swelling for 1 week. No known injury.  EXAM: RIGHT FOOT COMPLETE - 3+ VIEW  COMPARISON:  None.  FINDINGS: Soft tissue swelling overlying the fifth MTP joint noted.  There is no evidence of acute fracture, subluxation or dislocation.  No bony erosive changes are identified.  The Lisfranc joints are intact.  IMPRESSION: Soft tissue swelling overlying the fifth MTP joint without acute bony abnormality.   Electronically Signed   By: Margarette Canada M.D.   On: 01/09/2015 14:54    Oren Binet, MD  [facility] Pager:[phone]  If 7PM-7AM, please contact night-coverage [email or web address] Password [email or web address] 02/07/2015, 12:48 PM   LOS: 1 day

## 2015-02-08 DIAGNOSIS — E119 Type 2 diabetes mellitus without complications: Secondary | ICD-10-CM

## 2015-02-08 LAB — CBC
HEMATOCRIT: 32.9 % — AB (ref 39.0–52.0)
Hemoglobin: 10.8 g/dL — ABNORMAL LOW (ref 13.0–17.0)
MCH: 32.7 pg (ref 26.0–34.0)
MCHC: 32.8 g/dL (ref 30.0–36.0)
MCV: 99.7 fL (ref 78.0–100.0)
Platelets: 230 10*3/uL (ref 150–400)
RBC: 3.3 MIL/uL — ABNORMAL LOW (ref 4.22–5.81)
RDW: 12.3 % (ref 11.5–15.5)
WBC: 7.1 10*3/uL (ref 4.0–10.5)

## 2015-02-08 LAB — GLUCOSE, CAPILLARY
GLUCOSE-CAPILLARY: 251 mg/dL — AB (ref 65–99)
GLUCOSE-CAPILLARY: 240 mg/dL — AB (ref 65–99)
Glucose-Capillary: 262 mg/dL — ABNORMAL HIGH (ref 65–99)
Glucose-Capillary: 144 mg/dL — ABNORMAL HIGH (ref 65–99)
Glucose-Capillary: 96 mg/dL (ref 65–99)
Glucose-Capillary: 182 mg/dL — ABNORMAL HIGH (ref 65–99)

## 2015-02-08 LAB — BASIC METABOLIC PANEL
Anion gap: 7 (ref 5–15)
BUN: 14 mg/dL (ref 6–20)
CALCIUM: 8.7 mg/dL — AB (ref 8.9–10.3)
CHLORIDE: 98 mmol/L — AB (ref 101–111)
CO2: 30 mmol/L (ref 22–32)
CREATININE: 1.22 mg/dL (ref 0.61–1.24)
GFR calc Af Amer: >60 mL/min (ref >60)
GFR calc non Af Amer: 60 mL/min — ABNORMAL LOW (ref >60)
GLUCOSE: 101 mg/dL — AB (ref 65–99)
Potassium: 3.6 mmol/L (ref 3.5–5.1)
Sodium: 135 mmol/L (ref 135–145)

## 2015-02-08 LAB — URINE CULTURE: CULTURE: NO GROWTH

## 2015-02-08 MED ORDER — AMLODIPINE BESYLATE 10 MG PO TABS
10.0000 mg | ORAL_TABLET | Freq: Every day | ORAL | Status: DC
  Administered 2015-02-08 – 2015-02-09 (×2): 10 mg via ORAL
  Filled 2015-02-08 (×2): qty 1

## 2015-02-08 MED ORDER — INSULIN ASPART 100 UNIT/ML ~~LOC~~ SOLN: 0.0000 [IU] | Freq: Every day | SUBCUTANEOUS | Status: DC

## 2015-02-08 MED ORDER — INSULIN ASPART 100 UNIT/ML ~~LOC~~ SOLN: 0.0000 [IU] | Freq: Three times a day (TID) | SUBCUTANEOUS | Status: DC

## 2015-02-08 MED ORDER — INSULIN ASPART 100 UNIT/ML ~~LOC~~ SOLN
0.0000 [IU] | Freq: Three times a day (TID) | SUBCUTANEOUS | Status: DC
  Administered 2015-02-08: 5 [IU] via SUBCUTANEOUS
  Administered 2015-02-08: 8 [IU] via SUBCUTANEOUS
  Administered 2015-02-09: 15 [IU] via SUBCUTANEOUS
  Administered 2015-02-09: 5 [IU] via SUBCUTANEOUS

## 2015-02-08 NOTE — Progress Notes (Signed)
[facility] PROGRESS NOTE  David [name] [id number] DOB: 20-Oct-1945 DOA: 02/06/2015 PCP: Leola Brazil, MD  Subjective:  Feeling much better today, more alert and no longer feels dizzy or "foggy".   Assessment/Plan: Principal Problem: Sepsis due to diabetic foot: Sepsis pathophysiology has resolved, patient has greatly improved. Afebrile today,leukocytosis has resolved.  MRI right foot negative for osteomyelitis or abscess. Continue IV Vancomycin, but will d/c Zosyn. Blood cultures negative so far. Orthopedics plans to trim callus prior to discharge, will see patient in clinic 2 weeks post-discharge to recheck. No surgical intervention recommended at this time  Active Problems: Acute on chronic kidney disease stage III: Acute renal failure secondary to prerenal azotemia secondary to above. Much improved with hydration, now euvolemic, stop IV fluids, encourage oral intake and follow. Cr continues to improve, 1.22 today from 1.84 on admission. Continued hold of home BP meds.   Hypertension: Controlled, continue labetalol and restart amlodipine. Hold losartan until Cr improves further. Monitor closely and adjust as needed.  Type 2 diabetes: CBGs much improved from yesterday. Now stable under 150. Continued careful monitoring of CBGs. Levemir at regular dose at bedtime if good tolerance of PO intake. Continue SSI.   Chronic back pain: Continue with  narcotics. Continue bowel regimen.  Peripheral neuropathy: Continue Neurontin, likely related to diabetes.  History of anxiety: Controlled - continue with as needed Xanax  DVT Prophylaxis: Heparin Code Status: Full Family Communication: Wife at bedside  Disposition Plan: Continue as inpatient, possible d/c 9/23  Consultants:  Orthopedics  Procedures:  None  Antibiotics:  Vancomycin 02/07/2015 >>   Zosyn 02/07/2015 >>  Objective: Filed Vitals:   02/08/15 0631  BP: 152/73  Pulse: 70  Temp:   Resp:      Intake/Output Summary (Last 24 hours) at 02/08/15 0933 Last data filed at 02/08/15 0417  Gross per 24 hour  Intake    415 ml  Output   1100 ml  Net   -685 ml   Filed Weights   02/07/15 0107 02/07/15 1859  Weight: 102.2 kg (225 lb 5 oz) 111.585 kg (246 lb)    Exam:   General:  NAD, resting comfortably, awake and alert  Neck: Supple, no JVD  Cardiovascular: RRR, no m/r/g  Respiratory: CTAB, no wheezes  Abdomen: Soft, non tender, non distended, +BS  Musculoskeletal: No edema, ROM intact in all 4  Skin: No rash or cyanosis, warm. Wound on lateral plantar aspect of right foot, minimal discharge and no odor today  Data Reviewed: Basic Metabolic Panel:  Recent Labs Lab 02/06/15 1935 02/07/15 0106 02/08/15 0725  NA 129* 134* 135  K 3.7 3.9 3.6  CL 90* 98* 98*  CO2 27 28 30   GLUCOSE 386* 327* 101*  BUN 22* 24* 14  CREATININE 1.84* 1.69* 1.22  CALCIUM 9.2 8.2* 8.7*   Liver Function Tests:  Recent Labs Lab 02/06/15 1935 02/07/15 0106  AST 29 19  ALT 25 21  ALKPHOS 71 59  BILITOT 0.8 0.7  PROT 8.6* 6.9  ALBUMIN 3.8 3.1*   No results for input(s): LIPASE, AMYLASE in the last 168 hours. No results for input(s): AMMONIA in the last 168 hours. CBC:  Recent Labs Lab 02/06/15 1935 02/07/15 0106 02/08/15 0725  WBC 17.6* 16.4* 7.1  NEUTROABS 15.5* 13.5*  --   HGB 12.5* 11.0* 10.8*  HCT 36.5* 32.8* 32.9*  MCV 97.1 98.2 99.7  PLT 255 237 230   Cardiac Enzymes:  Recent Labs Lab 02/07/15 0106  CKTOTAL 163  BNP (last 3 results) No results for input(s): BNP in the last 8760 hours.  ProBNP (last 3 results) No results for input(s): PROBNP in the last 8760 hours.  CBG:  Recent Labs Lab 02/07/15 1649 02/07/15 1953 02/08/15 0101 02/08/15 0415 02/08/15 0803  GLUCAP 276* 311* 262* 144* 96    Recent Results (from the past 240 hour(s))  Blood Culture (routine x 2)     Status: None (Preliminary result)   Collection Time: 02/06/15  7:01 PM   Result Value Ref Range Status   Specimen Description BLOOD RIGHT ARM  Final   Special Requests BOTTLES DRAWN AEROBIC AND ANAEROBIC 10ML  Final   Culture NO GROWTH < 24 HOURS  Final   Report Status PENDING  Incomplete  Blood Culture (routine x 2)     Status: None (Preliminary result)   Collection Time: 02/06/15  8:29 PM  Result Value Ref Range Status   Specimen Description BLOOD RIGHT ARM  Final   Special Requests BOTTLES DRAWN AEROBIC AND ANAEROBIC 5CC  Final   Culture NO GROWTH < 24 HOURS  Final   Report Status PENDING  Incomplete  Urine culture     Status: None   Collection Time: 02/07/15 12:15 AM  Result Value Ref Range Status   Specimen Description URINE, CLEAN CATCH  Final   Special Requests NONE  Final   Culture NO GROWTH 1 DAY  Final   Report Status 02/08/2015 FINAL  Final  MRSA PCR Screening     Status: Abnormal   Collection Time: 02/07/15 12:17 AM  Result Value Ref Range Status   MRSA by PCR POSITIVE (A) NEGATIVE Final    Comment:        The GeneXpert MRSA Assay (FDA approved for NASAL specimens only), is one component of a comprehensive MRSA colonization surveillance program. It is not intended to diagnose MRSA infection nor to guide or monitor treatment for MRSA infections. RESULT CALLED TO, READ BACK BY AND VERIFIED WITH: ABBY STOPHEL @0202  02/07/15 MKELLY    Studies: Mr Foot Right Wo Contrast  02/07/2015   CLINICAL DATA:  Diabetic foot infection. Soft tissue infection of the foot. Initial encounter.  EXAM: MRI OF THE RIGHT FOREFOOT WITHOUT CONTRAST  TECHNIQUE: Multiplanar, multisequence MR imaging was performed. No intravenous contrast was administered.  COMPARISON:  02/06/2015.  FINDINGS: Previous radiographs demonstrated and ulceration over the fifth MTP joint.  On MRI, this area shows infiltration of the subcutaneous tissues however there is no osteomyelitis. No erosion of the cortex of either the fifth metatarsal or the proximal phalanx of the small toe. The  ulceration is visible. There is no soft tissue abscess. 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 Subcutaneous edema in the forefoot compatible with cellulitis in the appropriate clinical setting. Flexor and extensor tendons appear within normal limits. The toes are flexed at the time of imaging.  IMPRESSION: Ulceration over the lateral plantar aspect of the fifth MTP joint without osteomyelitis or soft tissue abscess.   Electronically Signed   By: Dereck Ligas M.D.   On: 02/07/2015 07:45   Dg Foot Complete Right  02/06/2015   CLINICAL DATA:  Wound tube ball of right foot x2 days  EXAM: RIGHT FOOT COMPLETE - 3+ VIEW  COMPARISON:  01/08/2015  FINDINGS: Gas and soft tissue swelling overlies the fifth metatarsal phalangeal joint. No underlying bone erosion identified. No acute fracture or subluxation identified. Second through fifth hammertoe deformities are noted.  IMPRESSION: 1. Soft tissue swelling and gas overlies the  fifth MTP joint. No acute bone abnormality noted.   Electronically Signed   By: Kerby Moors M.D.   On: 02/06/2015 19:37    Scheduled Meds: . aspirin EC  81 mg Oral Daily  . Chlorhexidine Gluconate Cloth  6 each Topical Q0600  . gabapentin  600 mg Oral TID  . heparin  5,000 Units Subcutaneous 3 times per day  . insulin aspart  0-15 Units Subcutaneous TID WC  . insulin aspart  0-5 Units Subcutaneous QHS  . insulin detemir  25 Units Subcutaneous QHS  . labetalol  100 mg Oral BID  . morphine  60 mg Oral 3 times per day  . mupirocin ointment  1 application Nasal BID  . piperacillin-tazobactam (ZOSYN)  IV  3.375 g Intravenous Q8H  . polyethylene glycol  17 g Oral Daily  . senna  2 tablet Oral QHS  . sodium chloride  3 mL Intravenous Q12H  . vancomycin  1,250 mg Intravenous Q24H   Continuous Infusions:   Principal Problem:   Sepsis due to cellulitis Active Problems:   Hypertension   Chronic back pain   Diabetes mellitus   CKD (chronic kidney disease)  Time  spent: Ovid, Student-PA   [facility] If 7PM-7AM, please contact night-coverage at [email or web address], password [facility] 02/08/2015, 9:33 AM  LOS: 2 days    Attending MD note  Patient was seen, examined,treatment plan was discussed with the PA-S. I have personally reviewed the clinical findings, lab, imaging studies and management of this patient in detail. I agree with the documentation, as recorded by the PA-S.   Patient is significantly improved today, remains afebrile. Leukocytosis and renal function have essentially normalized. Cultures remain negative. Discontinue Zosyn, continue vancomycin. Ambulate with physical therapy today. Suspect home on 9/23. Rest as above  [facility]

## 2015-02-08 NOTE — Progress Notes (Signed)
Physical Therapy Evaluation Patient Details Name: David [name] MRN: [id number] DOB: [birth year] Today's Date: 02/08/2015   History of Present Illness  69 y.o. male admitted for Sepsis due to diabetic foot.  Clinical Impression  Pt admitted with above diagnosis. Pt currently with functional limitations due to the deficits listed below (see PT Problem List). Ambulates with a quad cane, fairly stable but quite an antalgic gait pattern. Good family support at home. Pt motivated to improve his health. Pt will benefit from skilled PT to increase their independence and safety with mobility to allow discharge to the venue listed below.       Follow Up Recommendations No PT follow up;Supervision - Intermittent    Equipment Recommendations  None recommended by PT (possibly shower seat/tub bench)    Recommendations for Other Services       Precautions / Restrictions Precautions Precautions: None Restrictions Weight Bearing Restrictions: No Other Position/Activity Restrictions: No order. Had pt keep weight off of forefoot      Mobility  Bed Mobility Overal bed mobility: Modified Independent             General bed mobility comments: extra time  Transfers Overall transfer level: Needs assistance Equipment used: Quad cane Transfers: Sit to/from Stand Sit to Stand: Supervision         General transfer comment: Supervision for safety. Minor sway noted. Performed x3 from lowest bed setting. VC for hand placement.  Ambulation/Gait Ambulation/Gait assistance: Supervision Ambulation Distance (Feet): 75 Feet Assistive device: Quad cane Gait Pattern/deviations: Step-through pattern;Decreased stride length;Decreased stance time - right;Decreased dorsiflexion - right;Trunk flexed;Antalgic Gait velocity: decreased Gait velocity interpretation: Below normal speed for age/gender General Gait Details: Very antalgic pattern. Knees and hips remain flexed. VC for upright posture and to  maintain weight bearing through Rt heel to unload callus on Rt foot.  Stairs            Wheelchair Mobility    Modified Rankin (Stroke Patients Only)       Balance Overall balance assessment: Needs assistance Sitting-balance support: No upper extremity supported;Feet supported Sitting balance-Leahy Scale: Normal     Standing balance support: No upper extremity supported Standing balance-Leahy Scale: Fair                               Pertinent Vitals/Pain Pain Assessment: 0-10 Pain Score: 4  Pain Location: Rt foot Pain Descriptors / Indicators: Aching Pain Intervention(s): Monitored during session;Repositioned    Home Living Family/patient expects to be discharged to:: Private residence Living Arrangements: Spouse/significant other Available Help at Discharge: Family;Available 24 hours/day Type of Home: House Home Access: Level entry     Home Layout: Two level;Bed/bath upstairs Home Equipment: Walker - 2 wheels;Cane - quad      Prior Function Level of Independence: Independent with assistive device(s)         Comments: Using quad cane for ambulation     Hand Dominance   Dominant Hand: Right    Extremity/Trunk Assessment   Upper Extremity Assessment: Defer to OT evaluation           Lower Extremity Assessment: Generalized weakness (reports Hx of bil knee pain)         Communication   Communication: No difficulties  Cognition Arousal/Alertness: Awake/alert Behavior During Therapy: WFL for tasks assessed/performed Overall Cognitive Status: Within Functional Limits for tasks assessed  General Comments General comments (skin integrity, edema, etc.): Callus on lateral plantar surface of Rt forefoot    Exercises        Assessment/Plan    PT Assessment Patient needs continued PT services  PT Diagnosis Difficulty walking;Abnormality of gait;Generalized weakness;Acute pain   PT Problem List  Decreased strength;Decreased range of motion;Decreased activity tolerance;Decreased balance;Decreased mobility;Decreased knowledge of use of DME;Impaired sensation;Pain  PT Treatment Interventions DME instruction;Gait training;Stair training;Functional mobility training;Therapeutic activities;Therapeutic exercise;Balance training;Neuromuscular re-education;Patient/family education   PT Goals (Current goals can be found in the Care Plan section) Acute Rehab PT Goals Patient Stated Goal: No pain PT Goal Formulation: With patient Time For Goal Achievement: 02/22/15 Potential to Achieve Goals: Good    Frequency Min 3X/week   Barriers to discharge        Co-evaluation               End of Session   Activity Tolerance: Patient tolerated treatment well Patient left: in bed;with call bell/phone within reach Nurse Communication: Mobility status         Time: [id number] PT Time Calculation (min) (ACUTE ONLY): 21 min   Charges:   PT Evaluation $Initial PT Evaluation Tier I: 1 Procedure     PT G CodesEllouise Newer 02/08/2015, 2:42 PM  Camille Bal [place], [place]

## 2015-02-09 DIAGNOSIS — N179 Acute kidney failure, unspecified: Secondary | ICD-10-CM

## 2015-02-09 DIAGNOSIS — E118 Type 2 diabetes mellitus with unspecified complications: Secondary | ICD-10-CM

## 2015-02-09 LAB — GLUCOSE, CAPILLARY
GLUCOSE-CAPILLARY: 230 mg/dL — AB (ref 65–99)
GLUCOSE-CAPILLARY: 291 mg/dL — AB (ref 65–99)
GLUCOSE-CAPILLARY: 355 mg/dL — AB (ref 65–99)

## 2015-02-09 MED ORDER — DOXYCYCLINE HYCLATE 50 MG PO CAPS: 100.0000 mg | ORAL_CAPSULE | Freq: Two times a day (BID) | ORAL | Status: DC

## 2015-02-09 NOTE — Progress Notes (Signed)
Pt stable Callus trimmed Ok for dc F/u duda next week

## 2015-02-09 NOTE — Discharge Summary (Signed)
PATIENT DETAILS Name: David [name] Age: 69 y.o. Sex: male Date of Birth: 17-Dec-1945 MRN: [id number]. Admitting Physician: Lavina Hamman, MD [id number] Abran Cantor, MD  Admit Date: 02/06/2015 Discharge date: 02/09/2015  Recommendations for Outpatient Follow-up:  1. Please ensure follow-up with [name] for continued outpatient wound care 2. Please repeat CBC/BMET at next visit 3. Please follow blood cultures till final  PRIMARY DISCHARGE DIAGNOSIS:  Principal Problem:   Sepsis due to cellulitis Active Problems:   Hypertension   Chronic back pain   Diabetes mellitus   CKD (chronic kidney disease)      PAST MEDICAL HISTORY: Past Medical History  Diagnosis Date  . Diabetes mellitus without complication   . Hypertension   . Glaucoma     DISCHARGE MEDICATIONS: Current Discharge Medication List    START taking these medications   Details  doxycycline (VIBRAMYCIN) 50 MG capsule Take 2 capsules (100 mg total) by mouth 2 (two) times daily. Qty: 28 capsule, Refills: 0      CONTINUE these medications which have NOT CHANGED   Details  ALPRAZolam (XANAX) 1 MG tablet Take 1 mg by mouth daily as needed for anxiety.     amLODipine (NORVASC) 10 MG tablet Take 1 tablet (10 mg total) by mouth daily. Qty: 30 tablet, Refills: 1    aspirin EC 81 MG tablet Take 81 mg by mouth daily.    bimatoprost (LUMIGAN) 0.03 % ophthalmic solution Place 1 drop into both eyes at bedtime.    carvedilol (COREG) 12.5 MG tablet Take 12.5 mg by mouth 2 (two) times daily with a meal.    cetirizine (ZYRTEC) 10 MG tablet Take 10 mg by mouth daily.    gabapentin (NEURONTIN) 600 MG tablet Take 600 mg by mouth 3 (three) times daily.    labetalol (NORMODYNE) 100 MG tablet Take 1 tablet (100 mg total) by mouth 2 (two) times daily. Qty: 30 tablet, Refills: 0    LEVEMIR FLEXTOUCH 100 UNIT/ML Pen Inject 55 Units into the skin at bedtime.     losartan (COZAAR) 100 MG tablet Take 100 mg by mouth  daily.    morphine (MS CONTIN) 60 MG 12 hr tablet Take 60 mg by mouth every 8 (eight) hours. Refills: 0    Multiple Vitamin (MULTI-VITAMIN PO) Take 1 tablet by mouth daily.    Multiple Vitamins-Minerals (CENTRUM SILVER ADULT 50+) TABS Take 1 tablet by mouth daily.    Tetrahydrozoline HCl (EYE DROPS OP) Apply 2 drops to eye 3 (three) times daily as needed (for dry eyes).    Hydrocodone-Acetaminophen 7.5-300 MG TABS Take 1 tablet by mouth 2 (two) times daily as needed (for pain).         ALLERGIES:  No Known Allergies  BRIEF HPI:  See H&P, Labs, Consult and Test reports for all details in brief, patient is a 69 year old male with history of diabetes, hypertension who presented with fever, fatigue and worsening right foot ulcer.  CONSULTATIONS:   orthopedic surgery  PERTINENT RADIOLOGIC STUDIES: Mr Foot Right Wo Contrast  02/07/2015   CLINICAL DATA:  Diabetic foot infection. Soft tissue infection of the foot. Initial encounter.  EXAM: MRI OF THE RIGHT FOREFOOT WITHOUT CONTRAST  TECHNIQUE: Multiplanar, multisequence MR imaging was performed. No intravenous contrast was administered.  COMPARISON:  02/06/2015.  FINDINGS: Previous radiographs demonstrated and ulceration over the fifth MTP joint.  On MRI, this area shows infiltration of the subcutaneous tissues however there is no osteomyelitis. No erosion of the cortex of either  the fifth metatarsal or the proximal phalanx of the small toe. The ulceration is visible. There is no soft tissue abscess. 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 Subcutaneous edema in the forefoot compatible with cellulitis in the appropriate clinical setting. Flexor and extensor tendons appear within normal limits. The toes are flexed at the time of imaging.  IMPRESSION: Ulceration over the lateral plantar aspect of the fifth MTP joint without osteomyelitis or soft tissue abscess.   Electronically Signed   By: Dereck Ligas M.D.   On: 02/07/2015 07:45    Dg Foot Complete Right  02/06/2015   CLINICAL DATA:  Wound tube ball of right foot x2 days  EXAM: RIGHT FOOT COMPLETE - 3+ VIEW  COMPARISON:  01/08/2015  FINDINGS: Gas and soft tissue swelling overlies the fifth metatarsal phalangeal joint. No underlying bone erosion identified. No acute fracture or subluxation identified. Second through fifth hammertoe deformities are noted.  IMPRESSION: 1. Soft tissue swelling and gas overlies the fifth MTP joint. No acute bone abnormality noted.   Electronically Signed   By: Kerby Moors M.D.   On: 02/06/2015 19:37     PERTINENT LAB RESULTS: CBC:  Recent Labs  02/07/15 0106 02/08/15 0725  WBC 16.4* 7.1  HGB 11.0* 10.8*  HCT 32.8* 32.9*  PLT 237 230   CMET CMP     Component Value Date/Time   NA 135 02/08/2015 0725   K 3.6 02/08/2015 0725   CL 98* 02/08/2015 0725   CO2 30 02/08/2015 0725   GLUCOSE 101* 02/08/2015 0725   BUN 14 02/08/2015 0725   CREATININE 1.22 02/08/2015 0725   CALCIUM 8.7* 02/08/2015 0725   PROT 6.9 02/07/2015 0106   ALBUMIN 3.1* 02/07/2015 0106   AST 19 02/07/2015 0106   ALT 21 02/07/2015 0106   ALKPHOS 59 02/07/2015 0106   BILITOT 0.7 02/07/2015 0106   GFRNONAA 60* 02/08/2015 0725   GFRAA >60 02/08/2015 0725    GFR Estimated Creatinine Clearance: 78.1 mL/min (by C-G formula based on Cr of 1.22). No results for input(s): LIPASE, AMYLASE in the last 72 hours.  Recent Labs  02/07/15 0106  CKTOTAL 163   Invalid input(s): POCBNP No results for input(s): DDIMER in the last 72 hours. No results for input(s): HGBA1C in the last 72 hours. No results for input(s): CHOL, HDL, LDLCALC, TRIG, CHOLHDL, LDLDIRECT in the last 72 hours. No results for input(s): TSH, T4TOTAL, T3FREE, THYROIDAB in the last 72 hours.  Invalid input(s): FREET3 No results for input(s): VITAMINB12, FOLATE, FERRITIN, TIBC, IRON, RETICCTPCT in the last 72 hours. Coags:  Recent Labs  02/07/15 0106  INR 1.23   Microbiology: Recent  Results (from the past 240 hour(s))  Blood Culture (routine x 2)     Status: None (Preliminary result)   Collection Time: 02/06/15  7:01 PM  Result Value Ref Range Status   Specimen Description BLOOD RIGHT ARM  Final   Special Requests BOTTLES DRAWN AEROBIC AND ANAEROBIC 10ML  Final   Culture NO GROWTH 2 DAYS  Final   Report Status PENDING  Incomplete  Blood Culture (routine x 2)     Status: None (Preliminary result)   Collection Time: 02/06/15  8:29 PM  Result Value Ref Range Status   Specimen Description BLOOD RIGHT ARM  Final   Special Requests BOTTLES DRAWN AEROBIC AND ANAEROBIC 5CC  Final   Culture NO GROWTH 2 DAYS  Final   Report Status PENDING  Incomplete  Urine culture     Status: None  Collection Time: 02/07/15 12:15 AM  Result Value Ref Range Status   Specimen Description URINE, CLEAN CATCH  Final   Special Requests NONE  Final   Culture NO GROWTH 1 DAY  Final   Report Status 02/08/2015 FINAL  Final  MRSA PCR Screening     Status: Abnormal   Collection Time: 02/07/15 12:17 AM  Result Value Ref Range Status   MRSA by PCR POSITIVE (A) NEGATIVE Final    Comment:        The GeneXpert MRSA Assay (FDA approved for NASAL specimens only), is one component of a comprehensive MRSA colonization surveillance program. It is not intended to diagnose MRSA infection nor to guide or monitor treatment for MRSA infections. RESULT CALLED TO, READ BACK BY AND VERIFIED WITH: ABBY STOPHEL @0202  02/07/15 MKELLY      BRIEF HOSPITAL COURSE:   Principal Problem: Sepsis due to diabetic foot: Sepsis pathophysiology has resolved, patient has greatly improved with empiric vancomycin and Zosyn. He is now afebrile for more than 2 days, leukocytosis has resolved. Blood cultures continue to stay negative at the time of discharge. MRI right foot negative for osteomyelitis or abscess. Orthopedics plans to trim callus prior to discharge, patient also asked to follow-up with [name] on discharge. He  will be transitioned to doxycycline for additional week.  Active Problems: Acute on chronic kidney disease stage III: Acute renal failure secondary to prerenal azotemia secondary to above. Creatinine has normalized supportive care.  Essential hypertension: Resume usual antihypertensives on discharge. Losartan was briefly held because of ARF-however will be resumed on discharge since renal function significantly improved.  Type 2 diabetes: CBGs stable-resume usual regimen of Levemir on discharge.  Chronic back pain: Continue with narcotics. Continue bowel regimen.  Peripheral neuropathy: Continue Neurontin, likely related to diabetes.  History of anxiety: Controlled - continue with as needed Xanax  TODAY-DAY OF DISCHARGE:  Subjective:   Alejandro Ajello today has no headache,no chest abdominal pain,no new weakness tingling or numbness, feels much better wants to go home today.   Objective:   Blood pressure 144/68, pulse 64, temperature 98.4 F (36.9 C), temperature source Oral, resp. rate 18, height 6\' 2"  (1.88 m), weight 111.62 kg (246 lb 1.2 oz), SpO2 98 %.  Intake/Output Summary (Last 24 hours) at 02/09/15 1034 Last data filed at 02/08/15 1539  Gross per 24 hour  Intake    240 ml  Output      0 ml  Net    240 ml   Filed Weights   02/07/15 0107 02/07/15 1859 02/09/15 0631  Weight: 102.2 kg (225 lb 5 oz) 111.585 kg (246 lb) 111.62 kg (246 lb 1.2 oz)    Exam Awake Alert, Oriented *3, No new F.N deficits, Normal affect [place].AT,PERRAL Supple Neck,No JVD, No cervical lymphadenopathy appriciated.  Symmetrical Chest wall movement, Good air movement bilaterally, CTAB RRR,No Gallops,Rubs or new Murmurs, No Parasternal Heave +ve B.Sounds, Abd Soft, Non tender, No organomegaly appriciated, No rebound -guarding or rigidity. No Cyanosis, Clubbing or edema, No new Rash or bruise  DISCHARGE CONDITION: Stable  DISPOSITION: Home  DISCHARGE INSTRUCTIONS:    Activity:  As tolerated  with Full fall precautions use walker/cane & assistance as needed  Get Medicines reviewed and adjusted: Please take all your medications with you for your next visit with your Primary MD  Please request your Primary MD to go over all hospital tests and procedure/radiological results at the follow up, please ask your Primary MD to get all Hospital records  sent to his/her office.  If you experience worsening of your admission symptoms, develop shortness of breath, life threatening emergency, suicidal or homicidal thoughts you must seek medical attention immediately by calling 911 or calling your MD immediately  if symptoms less severe.  You must read complete instructions/literature along with all the possible adverse reactions/side effects for all the Medicines you take and that have been prescribed to you. Take any new Medicines after you have completely understood and accpet all the possible adverse reactions/side effects.   Do not drive when taking Pain medications.   Do not take more than prescribed Pain, Sleep and Anxiety Medications  Special Instructions: If you have smoked or chewed Tobacco  in the last 2 yrs please stop smoking, stop any regular Alcohol  and or any Recreational drug use.  Wear Seat belts while driving.  Please note  You were cared for by a hospitalist during your hospital stay. Once you are discharged, your primary care physician will handle any further medical issues. Please note that NO REFILLS for any discharge medications will be authorized once you are discharged, as it is imperative that you return to your primary care physician (or establish a relationship with a primary care physician if you do not have one) for your aftercare needs so that they can reassess your need for medications and monitor your lab values.   Diet recommendation: Diabetic Diet Heart Healthy diet  Discharge Instructions    Diet - low sodium heart healthy    Complete by:  As directed       Diet Carb Modified    Complete by:  As directed      Increase activity slowly    Complete by:  As directed            Follow-up Information    Follow up with [facility] JR,GEORGE R, MD. Schedule an appointment as soon as possible for a visit in 1 week.   Specialty:  Pulmonary Disease   Contact information:   [place] [address] [phone]       Follow up with DUDA,MARCUS V, MD. Schedule an appointment as soon as possible for a visit in 1 week.   Specialty:  Orthopedic Surgery   Contact information:   [address] [place] [address] [phone]       Total Time spent on discharge equals 45 minutes.  SignedOren Binet 02/09/2015 10:34 AM

## 2015-02-09 NOTE — Care Management Note (Addendum)
Date:  02/09/15 Spoke with patient at the bedside along with wife. Introduced self as Tourist information centre manager and explained role in discharge planning and how to be reached. Verified patient lives in town, with spouse, has DME rolling walker. Expressed potential need for shower stool DME. Verified patient anticipates to go home with family,  at time of discharge and will have full-time supervision by spouse at this time to best of their knowledge. Patient  denied needing help with their medication. Patient drives  to MD appointments. Verified patient has PCP [place].  NCM informed patient that his insurance would not cover a shower stool, only medicaid covers that, but it will be cheaper for him to get the shower stool from [facility].  Plan: CM will continue to follow for discharge planning and [facility] resources.Case Management Note  Patient Details  Name: David [name] MRN: [id number] Date of Birth: Jan 22, 1946  Subjective/Objective:                    Action/Plan:   Expected Discharge Date:                  Expected Discharge Plan:  Home/Self Care  In-House Referral:     Discharge planning Services  CM Consult  Post Acute Care Choice:    Choice offered to:     DME Arranged:    DME Agency:     HH Arranged:    [place] Agency:     Status of Service:  Completed, signed off  Medicare Important Message Given:    Date Medicare IM Given:    Medicare IM give by:    Date Additional Medicare IM Given:    Additional Medicare Important Message give by:     If discussed at [place] of Stay Meetings, dates discussed:    Additional Comments:  Zenon Mayo, RN 02/09/2015, 12:03 PM

## 2015-02-11 LAB — CULTURE, BLOOD (ROUTINE X 2)
Culture: NO GROWTH
Culture: NO GROWTH

## 2015-02-13 ENCOUNTER — Emergency Department (HOSPITAL_COMMUNITY): Payer: BC Managed Care – PPO

## 2015-02-13 ENCOUNTER — Encounter (HOSPITAL_COMMUNITY): Payer: Self-pay | Admitting: *Deleted

## 2015-02-13 ENCOUNTER — Emergency Department (HOSPITAL_COMMUNITY)
Admission: EM | Admit: 2015-02-13 | Discharge: 2015-02-13 | Disposition: A | Discharge status: Home / Self Care | Payer: BC Managed Care – PPO | Attending: Emergency Medicine | Admitting: Emergency Medicine

## 2015-02-13 DIAGNOSIS — Z7982 Long term (current) use of aspirin: Secondary | ICD-10-CM | POA: Diagnosis not present

## 2015-02-13 DIAGNOSIS — Z79899 Other long term (current) drug therapy: Secondary | ICD-10-CM | POA: Insufficient documentation

## 2015-02-13 DIAGNOSIS — R51 Headache: Secondary | ICD-10-CM | POA: Insufficient documentation

## 2015-02-13 DIAGNOSIS — I10 Essential (primary) hypertension: Secondary | ICD-10-CM | POA: Insufficient documentation

## 2015-02-13 DIAGNOSIS — Z792 Long term (current) use of antibiotics: Secondary | ICD-10-CM | POA: Insufficient documentation

## 2015-02-13 DIAGNOSIS — R1084 Generalized abdominal pain: Secondary | ICD-10-CM | POA: Insufficient documentation

## 2015-02-13 DIAGNOSIS — R112 Nausea with vomiting, unspecified: Secondary | ICD-10-CM

## 2015-02-13 DIAGNOSIS — E119 Type 2 diabetes mellitus without complications: Secondary | ICD-10-CM | POA: Insufficient documentation

## 2015-02-13 DIAGNOSIS — R519 Headache, unspecified: Secondary | ICD-10-CM

## 2015-02-13 DIAGNOSIS — Z8669 Personal history of other diseases of the nervous system and sense organs: Secondary | ICD-10-CM | POA: Insufficient documentation

## 2015-02-13 DIAGNOSIS — R109 Unspecified abdominal pain: Secondary | ICD-10-CM

## 2015-02-13 LAB — COMPREHENSIVE METABOLIC PANEL
ALT: 56 U/L (ref 17–63)
AST: 49 U/L — ABNORMAL HIGH (ref 15–41)
Albumin: 3.7 g/dL (ref 3.5–5.0)
Alkaline Phosphatase: 68 U/L (ref 38–126)
Anion gap: 11 (ref 5–15)
BUN: 13 mg/dL (ref 6–20)
CO2: 27 mmol/L (ref 22–32)
Calcium: 9.8 mg/dL (ref 8.9–10.3)
Chloride: 95 mmol/L — ABNORMAL LOW (ref 101–111)
Creatinine, Ser: 0.87 mg/dL (ref 0.61–1.24)
GFR calc Af Amer: >60 mL/min (ref >60)
GFR calc non Af Amer: >60 mL/min (ref >60)
Glucose, Bld: 277 mg/dL — ABNORMAL HIGH (ref 65–99)
Potassium: 3.7 mmol/L (ref 3.5–5.1)
Sodium: 133 mmol/L — ABNORMAL LOW (ref 135–145)
Total Bilirubin: 0.8 mg/dL (ref 0.3–1.2)
Total Protein: 8.6 g/dL — ABNORMAL HIGH (ref 6.5–8.1)

## 2015-02-13 LAB — CBC
HCT: 38.6 % — ABNORMAL LOW (ref 39.0–52.0)
Hemoglobin: 13.5 g/dL (ref 13.0–17.0)
MCH: 33.5 pg (ref 26.0–34.0)
MCHC: 35 g/dL (ref 30.0–36.0)
MCV: 95.8 fL (ref 78.0–100.0)
Platelets: 343 10*3/uL (ref 150–400)
RBC: 4.03 MIL/uL — ABNORMAL LOW (ref 4.22–5.81)
RDW: 11.9 % (ref 11.5–15.5)
WBC: 11.5 10*3/uL — ABNORMAL HIGH (ref 4.0–10.5)

## 2015-02-13 LAB — URINALYSIS, ROUTINE W REFLEX MICROSCOPIC
Bilirubin Urine: NEGATIVE
Glucose, UA: 500 mg/dL — AB
KETONES UR: 40 mg/dL — AB
LEUKOCYTES UA: NEGATIVE
NITRITE: NEGATIVE
PH: 7.5 (ref 5.0–8.0)
Protein, ur: >300 mg/dL — AB
SPECIFIC GRAVITY, URINE: 1.026 (ref 1.005–1.030)
Urobilinogen, UA: 1 mg/dL (ref 0.0–1.0)

## 2015-02-13 LAB — I-STAT CG4 LACTIC ACID, ED
Lactic Acid, Venous: 1.51 mmol/L (ref 0.5–2.0)
Lactic Acid, Venous: 1.43 mmol/L (ref 0.5–2.0)

## 2015-02-13 LAB — URINE MICROSCOPIC-ADD ON

## 2015-02-13 LAB — CBG MONITORING, ED: Glucose-Capillary: 256 mg/dL — ABNORMAL HIGH (ref 65–99)

## 2015-02-13 MED ORDER — ONDANSETRON 4 MG PO TBDP
4.0000 mg | ORAL_TABLET | Freq: Once | ORAL | Status: AC | PRN
  Administered 2015-02-13: 4 mg via ORAL

## 2015-02-13 MED ORDER — MORPHINE SULFATE (PF) 4 MG/ML IV SOLN: 4.0000 mg | Freq: Once | INTRAVENOUS | Status: DC

## 2015-02-13 MED ORDER — LOSARTAN POTASSIUM 50 MG PO TABS
100.0000 mg | ORAL_TABLET | Freq: Every day | ORAL | Status: DC
  Administered 2015-02-13: 100 mg via ORAL
  Filled 2015-02-13: qty 2

## 2015-02-13 MED ORDER — SODIUM CHLORIDE 0.9 % IV BOLUS (SEPSIS)
1000.0000 mL | Freq: Once | INTRAVENOUS | Status: AC
  Administered 2015-02-13: 1000 mL via INTRAVENOUS

## 2015-02-13 MED ORDER — IOHEXOL 300 MG/ML  SOLN
100.0000 mL | Freq: Once | INTRAMUSCULAR | Status: AC | PRN
  Administered 2015-02-13: 100 mL via INTRAVENOUS

## 2015-02-13 MED ORDER — LABETALOL HCL 100 MG PO TABS
100.0000 mg | ORAL_TABLET | Freq: Once | ORAL | Status: AC
  Administered 2015-02-13: 100 mg via ORAL
  Filled 2015-02-13: qty 1

## 2015-02-13 MED ORDER — DIPHENHYDRAMINE HCL 50 MG/ML IJ SOLN
25.0000 mg | Freq: Once | INTRAMUSCULAR | Status: AC
  Administered 2015-02-13: 25 mg via INTRAVENOUS
  Filled 2015-02-13: qty 1

## 2015-02-13 MED ORDER — PROMETHAZINE HCL 25 MG PO TABS: 25.0000 mg | ORAL_TABLET | Freq: Four times a day (QID) | ORAL | Status: DC | PRN

## 2015-02-13 MED ORDER — ONDANSETRON 4 MG PO TBDP
ORAL_TABLET | ORAL | Status: AC
  Filled 2015-02-13: qty 1

## 2015-02-13 MED ORDER — HYDROCODONE-ACETAMINOPHEN 5-325 MG PO TABS: ORAL_TABLET | ORAL | Status: DC

## 2015-02-13 MED ORDER — PROCHLORPERAZINE EDISYLATE 5 MG/ML IJ SOLN
10.0000 mg | Freq: Four times a day (QID) | INTRAMUSCULAR | Status: DC | PRN
  Administered 2015-02-13: 10 mg via INTRAVENOUS
  Filled 2015-02-13: qty 2

## 2015-02-13 NOTE — ED Notes (Signed)
PA [place] at bedside.

## 2015-02-13 NOTE — ED Notes (Signed)
PA at bedside.

## 2015-02-13 NOTE — ED Provider Notes (Signed)
CSN: [id number]     Arrival date & time 02/13/15  1528 History   First MD Initiated Contact with Patient 02/13/15 1844     Chief Complaint  Patient presents with  . Nausea  . Emesis  . Abdominal Pain     (Consider location/radiation/quality/duration/timing/severity/associated sxs/prior Treatment) HPI   Blood pressure 177/113, pulse 94, temperature 98.1 F (36.7 C), temperature source Oral, resp. rate 21, SpO2 100 %.  David [name] is a 69 y.o. male complaining of generalized fatigue, nonbloody, nonbilious, non-coffee ground emesis with associated diffuse colicky abdominal pain and generalized headache. Patient states he's having normal bowel movement, normal urination. Patient denies fever, chills, chest pain, shortness of breath, worsening pain to diabetic ulcer on plantar aspect of right foot. Patient does not report an increase in pain in this area. Patient is not taken his hypertension medications today.  Past Medical History  Diagnosis Date  . Diabetes mellitus without complication   . Hypertension   . Glaucoma    Past Surgical History  Procedure Laterality Date  . Joint replacement    . Back surgery    . Cyst excision      on Back  . Total hip arthroplasty      Right    Family History  Problem Relation Age of Onset  . Diabetes Other   . Hyperlipidemia Other   . Hypertension Other   . Stroke Other   . Alzheimer's disease Other    Social History  Substance Use Topics  . Smoking status: Never Smoker   . Smokeless tobacco: None  . Alcohol Use: No    Review of Systems  10 systems reviewed and found to be negative, except as noted in the HPI.   Allergies  Review of patient's allergies indicates no known allergies.  Home Medications   Prior to Admission medications   Medication Sig Start Date End Date Taking? Authorizing Haide Klinker  ALPRAZolam Duanne Moron) 1 MG tablet Take 1 mg by mouth daily as needed for anxiety.  05/23/13   Historical Anona Giovannini, MD  amLODipine  (NORVASC) 10 MG tablet Take 1 tablet (10 mg total) by mouth daily. 09/17/13   Simbiso Ranga, MD  aspirin EC 81 MG tablet Take 81 mg by mouth daily.    Historical Eula Jaster, MD  bimatoprost (LUMIGAN) 0.03 % ophthalmic solution Place 1 drop into both eyes at bedtime.    Historical Saoirse Legere, MD  carvedilol (COREG) 12.5 MG tablet Take 12.5 mg by mouth 2 (two) times daily with a meal.    Historical Pershing Skidmore, MD  cetirizine (ZYRTEC) 10 MG tablet Take 10 mg by mouth daily.    Historical Michella Detjen, MD  doxycycline (VIBRAMYCIN) 50 MG capsule Take 2 capsules (100 mg total) by mouth 2 (two) times daily. 02/09/15   Shanker Kristeen Mans, MD  gabapentin (NEURONTIN) 600 MG tablet Take 600 mg by mouth 3 (three) times daily. 01/31/15   Historical Raghad Lorenz, MD  Hydrocodone-Acetaminophen 7.5-300 MG TABS Take 1 tablet by mouth 2 (two) times daily as needed (for pain).  07/22/13   Historical Vylet Maffia, MD  labetalol (NORMODYNE) 100 MG tablet Take 1 tablet (100 mg total) by mouth 2 (two) times daily. 10/09/13   Wandra Arthurs, MD  LEVEMIR FLEXTOUCH 100 UNIT/ML Pen Inject 55 Units into the skin at bedtime.  08/04/13   Historical Jymir Dunaj, MD  losartan (COZAAR) 100 MG tablet Take 100 mg by mouth daily.    Historical Brookelyn Gaynor, MD  morphine (MS CONTIN) 60 MG 12 hr tablet Take  60 mg by mouth every 8 (eight) hours. 12/28/14   Historical Bralyn Folkert, MD  Multiple Vitamin (MULTI-VITAMIN PO) Take 1 tablet by mouth daily.    Historical Liddie Chichester, MD  Multiple Vitamins-Minerals (CENTRUM SILVER ADULT 50+) TABS Take 1 tablet by mouth daily.    Historical Verlin Uher, MD  Tetrahydrozoline HCl (EYE DROPS OP) Apply 2 drops to eye 3 (three) times daily as needed (for dry eyes).    Historical George Haggart, MD   BP 177/113 mmHg  Pulse 94  Temp(Src) 98.1 F (36.7 C) (Oral)  Resp 21  SpO2 100% Physical Exam  Constitutional: He is oriented to person, place, and time. He appears well-developed and well-nourished.  HENT:  Head: Normocephalic and atraumatic.   Mouth/Throat: Oropharynx is clear and moist.  Eyes: Conjunctivae and EOM are normal. Pupils are equal, round, and reactive to light.  No TTP of maxillary or frontal sinuses  No TTP or induration of temporal arteries bilaterally  Neck: Normal range of motion. Neck supple.  FROM to C-spine. Pt can touch chin to chest without discomfort. No TTP of midline cervical spine.   Cardiovascular: Normal rate, regular rhythm and intact distal pulses.   Pulmonary/Chest: Effort normal and breath sounds normal. No respiratory distress. He has no wheezes. He has no rales. He exhibits no tenderness.  Abdominal: Soft. Bowel sounds are normal. There is tenderness.  Mild, diffuse tenderness to palpation with no guarding or rebound.  Murphy sign negative, no tenderness to palpation over McBurney's point, Rovsings, Psoas and obturator all negative.   Musculoskeletal: Normal range of motion. He exhibits no edema or tenderness.  Neurological: He is alert and oriented to person, place, and time. No cranial nerve deficit.  II-Visual fields grossly intact. III/IV/VI-Extraocular movements intact.  Pupils reactive bilaterally. V/VII-Smile symmetric, equal eyebrow raise,  facial sensation intact VIII- Hearing grossly intact IX/X-Normal gag XI-bilateral shoulder shrug XII-midline tongue extension Motor: 5/5 bilaterally with normal tone and bulk Cerebellar: Normal finger-to-nose  and normal heel-to-shin test.   Romberg negative Ambulates with a coordinated gait   Skin:  Callus to plantar aspect of right first digit MTP with no warmth, discharge, surrounding induration or significant tenderness to palpation.  Nursing note and vitals reviewed.   ED Course  Procedures (including critical care time) Labs Review Labs Reviewed  COMPREHENSIVE METABOLIC PANEL - Abnormal; Notable for the following:    Sodium 133 (*)    Chloride 95 (*)    Glucose, Bld 277 (*)    Total Protein 8.6 (*)    AST 49 (*)    All other  components within normal limits  CBC - Abnormal; Notable for the following:    WBC 11.5 (*)    RBC 4.03 (*)    HCT 38.6 (*)    All other components within normal limits  CBG MONITORING, ED - Abnormal; Notable for the following:    Glucose-Capillary 256 (*)    All other components within normal limits  I-STAT CG4 LACTIC ACID, ED  I-STAT CG4 LACTIC ACID, ED    Imaging Review Ct Head Wo Contrast  02/13/2015   CLINICAL DATA:  Headache and vomiting, onset today.  EXAM: CT HEAD WITHOUT CONTRAST  TECHNIQUE: Contiguous axial images were obtained from the base of the skull through the vertex without intravenous contrast.  COMPARISON:  Head CT 09/13/2013  FINDINGS: No intracranial hemorrhage, mass effect, or midline shift. No hydrocephalus. The basilar cisterns are patent. No evidence of territorial infarct. No intracranial fluid collection. Calvarium is intact. There is a  mucous retention cyst in the right maxillary sinus. Paranasal sinuses are otherwise clear without fluid level. The mastoid air cells are well aerated.  IMPRESSION: No acute intracranial abnormality.   Electronically Signed   By: Jeb Levering M.D.   On: 02/13/2015 22:42   Ct Abdomen Pelvis W Contrast  02/13/2015   CLINICAL DATA:  Generalized abdomen pain starting yesterday  EXAM: CT ABDOMEN AND PELVIS WITH CONTRAST  TECHNIQUE: Multidetector CT imaging of the abdomen and pelvis was performed using the standard protocol following bolus administration of intravenous contrast.  CONTRAST:  100 mL Omnipaque 300  COMPARISON:  August 14, 2013  FINDINGS: The liver, spleen, pancreas, gallbladder, adrenal glands and kidneys are normal. There is no hydronephrosis bilaterally. There is minimal atherosclerosis of the aorta without aneurysmal dilatation. There is no abdominal lymphadenopathy. There is no small bowel obstruction or diverticulitis. Moderate bowel content is identified throughout colon. The appendix is normal.  Images of the pelvis  demonstrate fluid-filled bladder without abnormality. Evaluation of the lower pelvis is limited due to metallic artifact from right hip replacement. Lung bases are clear. Degenerative joint changes of the spine are noted.  IMPRESSION: No acute abnormality identified in the abdomen and pelvis.  Moderate bowel content identified throughout colon.   Electronically Signed   By: Abelardo Diesel M.D.   On: 02/13/2015 22:44   I have personally reviewed and evaluated these images and lab results as part of my medical decision-making.   EKG Interpretation None      MDM   Final diagnoses:  Headache  Abdominal pain, acute  Non-intractable vomiting with nausea, vomiting of unspecified type      Medications  prochlorperazine (COMPAZINE) injection 10 mg (10 mg Intravenous Given 02/13/15 2004)  losartan (COZAAR) tablet 100 mg (100 mg Oral Given 02/13/15 2308)  ondansetron (ZOFRAN-ODT) disintegrating tablet 4 mg (4 mg Oral Given 02/13/15 1544)  sodium chloride 0.9 % bolus 1,000 mL (0 mLs Intravenous Stopped 02/13/15 2104)  diphenhydrAMINE (BENADRYL) injection 25 mg (25 mg Intravenous Given 02/13/15 2004)  labetalol (NORMODYNE) tablet 100 mg (100 mg Oral Given 02/13/15 2308)  sodium chloride 0.9 % bolus 1,000 mL (1,000 mLs Intravenous New Bag/Given 02/13/15 2235)  iohexol (OMNIPAQUE) 300 MG/ML solution 100 mL (100 mLs Intravenous Contrast Given 02/13/15 2236)    David [name] is a 69 y.o. male presenting with headache, abdominal pain and multiple episodes of nausea and vomiting. Patient abdominal exam is nonsurgical, there is no focal tenderness palpation. He was reporting headache but his neuro exam today is nonfocal. Patient's diabetic ulcer with no signs of secondary infection Love pressure is significantly elevated however he is not taking his blood pressure medications because of the emesis. Blood work is reassuring with no significant leukocytosis. Urinalysis is not consistent with infection. Patient has  a normal lactic acid level. Patient will be given his home dose of blood pressure medications, headache cocktail and will CT abdomen pelvis and head.  CT is negative, patient has past by mouth challenge. Repeat abdominal exam remains nonsurgical. It is unclear how compliant this patient is with his medications and have encouraged him to take medications regularly and follow with his primary care physician as soon as possible.  This is a shared visit with the attending physician who personally evaluated the patient and agrees with the care plan.    Evaluation does not show pathology that would require ongoing emergent intervention or inpatient treatment. Pt is hemodynamically stable and mentating appropriately. Discussed findings and plan with  patient/guardian, who agrees with care plan. All questions answered. Return precautions discussed and outpatient follow up given.   Discharge Medication List as of 02/13/2015 11:02 PM    START taking these medications   Details  HYDROcodone-acetaminophen (NORCO/VICODIN) 5-325 MG tablet Take 1-2 tablets by mouth every 6 hours as needed for pain and/or cough., Print    promethazine (PHENERGAN) 25 MG tablet Take 1 tablet (25 mg total) by mouth every 6 (six) hours as needed for nausea or vomiting., Starting 02/13/2015, Until Discontinued, Print             Monico Blitz, PA-C 02/14/15 0023  Virgel Manifold, MD 02/21/15 1451

## 2015-02-13 NOTE — Discharge Instructions (Signed)
Take vicodin for breakthrough pain, do not drink alcohol, drive, care for children or do other critical tasks while taking vicodin.  Please follow with your primary care doctor in the next 2 days for a check-up. They must obtain records for further management.   Do not hesitate to return to the Emergency Department for any new, worsening or concerning symptoms.    Abdominal Pain Many things can cause belly (abdominal) pain. Most times, the belly pain is not dangerous. Many cases of belly pain can be watched and treated at home. HOME CARE   Do not take medicines that help you go poop (laxatives) unless told to by your doctor.  Only take medicine as told by your doctor.  Eat or drink as told by your doctor. Your doctor will tell you if you should be on a special diet. GET HELP IF:  You do not know what is causing your belly pain.  You have belly pain while you are sick to your stomach (nauseous) or have runny poop (diarrhea).  You have pain while you pee or poop.  Your belly pain wakes you up at night.  You have belly pain that gets worse or better when you eat.  You have belly pain that gets worse when you eat fatty foods.  You have a fever. GET HELP RIGHT AWAY IF:   The pain does not go away within 2 hours.  You keep throwing up (vomiting).  The pain changes and is only in the right or left part of the belly.  You have bloody or tarry looking poop. MAKE SURE YOU:   Understand these instructions.  Will watch your condition.  Will get help right away if you are not doing well or get worse. Document Released: 10/22/2007 Document Revised: 05/10/2013 Document Reviewed: 01/12/2013 [facility] Patient Information 2015 [place], [place]. This information is not intended to replace advice given to you by your health care provider. Make sure you discuss any questions you have with your health care provider.

## 2015-02-13 NOTE — ED Notes (Signed)
Patient reports generalized abdominal pain and nausea that started yesterday, headache and vomiting started today. Pt was just admitted for sepsis due to wound to bottom of the foot per patient.

## 2015-06-20 DIAGNOSIS — M4726 Other spondylosis with radiculopathy, lumbar region: Secondary | ICD-10-CM | POA: Diagnosis not present

## 2015-06-20 DIAGNOSIS — Z79891 Long term (current) use of opiate analgesic: Secondary | ICD-10-CM | POA: Diagnosis not present

## 2015-06-20 DIAGNOSIS — G894 Chronic pain syndrome: Secondary | ICD-10-CM | POA: Diagnosis not present

## 2015-06-20 DIAGNOSIS — M5416 Radiculopathy, lumbar region: Secondary | ICD-10-CM | POA: Diagnosis not present

## 2015-06-27 DIAGNOSIS — B351 Tinea unguium: Secondary | ICD-10-CM | POA: Diagnosis not present

## 2015-06-27 DIAGNOSIS — E1142 Type 2 diabetes mellitus with diabetic polyneuropathy: Secondary | ICD-10-CM | POA: Diagnosis not present

## 2015-06-27 DIAGNOSIS — S8265XA Nondisplaced fracture of lateral malleolus of left fibula, initial encounter for closed fracture: Secondary | ICD-10-CM | POA: Diagnosis not present

## 2015-06-27 DIAGNOSIS — M25562 Pain in left knee: Secondary | ICD-10-CM | POA: Diagnosis not present

## 2015-07-18 DIAGNOSIS — G894 Chronic pain syndrome: Secondary | ICD-10-CM | POA: Diagnosis not present

## 2015-07-18 DIAGNOSIS — M5416 Radiculopathy, lumbar region: Secondary | ICD-10-CM | POA: Diagnosis not present

## 2015-07-18 DIAGNOSIS — Z79891 Long term (current) use of opiate analgesic: Secondary | ICD-10-CM | POA: Diagnosis not present

## 2015-07-18 DIAGNOSIS — M4726 Other spondylosis with radiculopathy, lumbar region: Secondary | ICD-10-CM | POA: Diagnosis not present

## 2015-07-24 DIAGNOSIS — E1142 Type 2 diabetes mellitus with diabetic polyneuropathy: Secondary | ICD-10-CM | POA: Diagnosis not present

## 2015-07-24 DIAGNOSIS — B351 Tinea unguium: Secondary | ICD-10-CM | POA: Diagnosis not present

## 2015-07-24 DIAGNOSIS — S8265XD Nondisplaced fracture of lateral malleolus of left fibula, subsequent encounter for closed fracture with routine healing: Secondary | ICD-10-CM | POA: Diagnosis not present

## 2015-07-27 DIAGNOSIS — E669 Obesity, unspecified: Secondary | ICD-10-CM | POA: Diagnosis not present

## 2015-07-27 DIAGNOSIS — E1165 Type 2 diabetes mellitus with hyperglycemia: Secondary | ICD-10-CM | POA: Diagnosis not present

## 2015-07-27 DIAGNOSIS — Z794 Long term (current) use of insulin: Secondary | ICD-10-CM | POA: Diagnosis not present

## 2015-07-27 DIAGNOSIS — Z6831 Body mass index (BMI) 31.0-31.9, adult: Secondary | ICD-10-CM | POA: Diagnosis not present

## 2015-07-27 DIAGNOSIS — I10 Essential (primary) hypertension: Secondary | ICD-10-CM | POA: Diagnosis not present

## 2015-07-27 DIAGNOSIS — E784 Other hyperlipidemia: Secondary | ICD-10-CM | POA: Diagnosis not present

## 2015-08-03 DIAGNOSIS — Z794 Long term (current) use of insulin: Secondary | ICD-10-CM | POA: Diagnosis not present

## 2015-08-03 DIAGNOSIS — E1165 Type 2 diabetes mellitus with hyperglycemia: Secondary | ICD-10-CM | POA: Diagnosis not present

## 2015-08-14 DIAGNOSIS — F411 Generalized anxiety disorder: Secondary | ICD-10-CM | POA: Diagnosis not present

## 2015-08-14 DIAGNOSIS — I10 Essential (primary) hypertension: Secondary | ICD-10-CM | POA: Diagnosis not present

## 2015-08-14 DIAGNOSIS — G4700 Insomnia, unspecified: Secondary | ICD-10-CM | POA: Diagnosis not present

## 2015-08-22 DIAGNOSIS — S8265XD Nondisplaced fracture of lateral malleolus of left fibula, subsequent encounter for closed fracture with routine healing: Secondary | ICD-10-CM | POA: Diagnosis not present

## 2015-09-06 DIAGNOSIS — M5416 Radiculopathy, lumbar region: Secondary | ICD-10-CM | POA: Diagnosis not present

## 2015-09-06 DIAGNOSIS — M4726 Other spondylosis with radiculopathy, lumbar region: Secondary | ICD-10-CM | POA: Diagnosis not present

## 2015-09-06 DIAGNOSIS — Z79891 Long term (current) use of opiate analgesic: Secondary | ICD-10-CM | POA: Diagnosis not present

## 2015-09-06 DIAGNOSIS — G894 Chronic pain syndrome: Secondary | ICD-10-CM | POA: Diagnosis not present

## 2015-09-28 DIAGNOSIS — Z794 Long term (current) use of insulin: Secondary | ICD-10-CM | POA: Diagnosis not present

## 2015-09-28 DIAGNOSIS — E784 Other hyperlipidemia: Secondary | ICD-10-CM | POA: Diagnosis not present

## 2015-09-28 DIAGNOSIS — I10 Essential (primary) hypertension: Secondary | ICD-10-CM | POA: Diagnosis not present

## 2015-09-28 DIAGNOSIS — E1165 Type 2 diabetes mellitus with hyperglycemia: Secondary | ICD-10-CM | POA: Diagnosis not present

## 2015-10-11 DIAGNOSIS — Z79891 Long term (current) use of opiate analgesic: Secondary | ICD-10-CM | POA: Diagnosis not present

## 2015-10-11 DIAGNOSIS — M5416 Radiculopathy, lumbar region: Secondary | ICD-10-CM | POA: Diagnosis not present

## 2015-10-11 DIAGNOSIS — M4726 Other spondylosis with radiculopathy, lumbar region: Secondary | ICD-10-CM | POA: Diagnosis not present

## 2015-10-11 DIAGNOSIS — G894 Chronic pain syndrome: Secondary | ICD-10-CM | POA: Diagnosis not present

## 2015-12-12 DIAGNOSIS — G894 Chronic pain syndrome: Secondary | ICD-10-CM | POA: Diagnosis not present

## 2015-12-12 DIAGNOSIS — Z79891 Long term (current) use of opiate analgesic: Secondary | ICD-10-CM | POA: Diagnosis not present

## 2015-12-12 DIAGNOSIS — M4726 Other spondylosis with radiculopathy, lumbar region: Secondary | ICD-10-CM | POA: Diagnosis not present

## 2015-12-12 DIAGNOSIS — M5416 Radiculopathy, lumbar region: Secondary | ICD-10-CM | POA: Diagnosis not present

## 2015-12-13 DIAGNOSIS — Z79891 Long term (current) use of opiate analgesic: Secondary | ICD-10-CM | POA: Diagnosis not present

## 2016-02-12 DIAGNOSIS — E1165 Type 2 diabetes mellitus with hyperglycemia: Secondary | ICD-10-CM | POA: Diagnosis not present

## 2016-02-12 DIAGNOSIS — E1140 Type 2 diabetes mellitus with diabetic neuropathy, unspecified: Secondary | ICD-10-CM | POA: Diagnosis not present

## 2016-02-12 DIAGNOSIS — M961 Postlaminectomy syndrome, not elsewhere classified: Secondary | ICD-10-CM | POA: Diagnosis not present

## 2016-02-12 DIAGNOSIS — E784 Other hyperlipidemia: Secondary | ICD-10-CM | POA: Diagnosis not present

## 2016-02-12 DIAGNOSIS — I10 Essential (primary) hypertension: Secondary | ICD-10-CM | POA: Diagnosis not present

## 2016-02-12 DIAGNOSIS — Z794 Long term (current) use of insulin: Secondary | ICD-10-CM | POA: Diagnosis not present

## 2016-02-13 DIAGNOSIS — Z79891 Long term (current) use of opiate analgesic: Secondary | ICD-10-CM | POA: Diagnosis not present

## 2016-02-13 DIAGNOSIS — M5416 Radiculopathy, lumbar region: Secondary | ICD-10-CM | POA: Diagnosis not present

## 2016-02-13 DIAGNOSIS — G894 Chronic pain syndrome: Secondary | ICD-10-CM | POA: Diagnosis not present

## 2016-02-13 DIAGNOSIS — M4726 Other spondylosis with radiculopathy, lumbar region: Secondary | ICD-10-CM | POA: Diagnosis not present

## 2016-03-31 ENCOUNTER — Telehealth (INDEPENDENT_AMBULATORY_CARE_PROVIDER_SITE_OTHER): Payer: Self-pay | Admitting: Orthopedic Surgery

## 2016-03-31 NOTE — Telephone Encounter (Signed)
Patient called scheduled an appointment for 04/09/16. Patient asked in the mean time since his toe has started turning red in the last 2 weeks what should he do in the meantime. Patient said he wears tennis shoes at work.  The number to contact him is [phone]

## 2016-04-01 NOTE — Telephone Encounter (Signed)
I called and spoke with patient. He had a toenail that fallen off a couple months back. There is red discoloration. He feels it is similar to a prior incident when he had gangrene. I offered him a sooner appointment. 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 Made appt for 400pm tomorrow.

## 2016-04-02 ENCOUNTER — Ambulatory Visit (INDEPENDENT_AMBULATORY_CARE_PROVIDER_SITE_OTHER): Payer: Medicare Other | Admitting: Orthopedic Surgery

## 2016-04-02 ENCOUNTER — Encounter (INDEPENDENT_AMBULATORY_CARE_PROVIDER_SITE_OTHER): Payer: Self-pay | Admitting: Orthopedic Surgery

## 2016-04-02 VITALS — Ht 74.0 in | Wt 225.0 lb

## 2016-04-02 DIAGNOSIS — L02611 Cutaneous abscess of right foot: Secondary | ICD-10-CM

## 2016-04-02 DIAGNOSIS — B351 Tinea unguium: Secondary | ICD-10-CM | POA: Diagnosis not present

## 2016-04-02 MED ORDER — DOXYCYCLINE HYCLATE 50 MG PO CAPS: 100.0000 mg | ORAL_CAPSULE | Freq: Two times a day (BID) | ORAL | 0 refills | Status: DC

## 2016-04-02 NOTE — Progress Notes (Signed)
Wound Care Note   Patient: David [name]           Date of Birth: 11/03/1945           MRN: [id number]             PCP: Antony Blackbird, MD Visit Date: 04/02/2016   Assessment & Plan: Visit Diagnoses:  1. Cutaneous abscess of right foot     Plan: Have provided a prescription for Doxycycline. Will cleanse wound daily. Apply antibacterial ointment and dressing. Minimize weight bearing until healed. Have provided a Darco to offload the forefoot. He wiill follow up in 2 weeks.   Follow-Up Instructions: Return in about 2 weeks (around 04/16/2016).  Orders:  No orders of the defined types were placed in this encounter.  Meds ordered this encounter  Medications  . doxycycline (VIBRAMYCIN) 50 MG capsule    Sig: Take 2 capsules (100 mg total) by mouth 2 (two) times daily.    Dispense:  28 capsule    Refill:  0      Procedures: No notes on file   Clinical Data: No additional findings.   No images are attached to the encounter.   Subjective: Chief Complaint  Patient presents with  . Right Great Toe - Wound Check    Patient is here for right great toe evaluation. He states he was raking leaves outside Saturday and noticed blood in his sock when he had gone inside. He also noticed increase in redness. He has swelling. There is no current drainage today. He wanted to be evaluated to his prior history of gangrene. He denies any fever, chills or nausea. He also would like a nail trim for onycho mycotic nails.   Wound Check  He was originally treated 3 to 5 days ago. There has been bloody discharge from the wound. There is new redness present. There is new swelling present. There is new pain present.    Review of Systems  Constitutional: Negative for chills and fever.  Skin: Positive for wound.  All other systems reviewed and are negative.   Miscellaneous:  -Home Health Care: No  -Physical Therapy: No  -Out of Work?: No   Objective: Vital Signs: Ht 6\' 2"  (1.88 m)    Wt 225 lb (102.1 kg)   BMI 28.89 kg/m   Physical Exam: Patient is alert and oriented. No adenopathy. Well-dressed. Normal affect. Respirations easy.  Steady gait. Does not have protective sensation. Has an ulcer beneath the right great toe. There is purulence. Surrounding callus and nonviable tissue was pared. Following debridement wound has no depth. Does no probe. There is a foul odor. Wound with fibrinous exudative tissue in central wound bed.   Toenails are thickened and discolored 10. This I will safely trim nails. These were trimmed today.  Specialty Comments: No specialty comments available.   PMFS History: Patient Active Problem List   Diagnosis Date Noted  . Sepsis due to cellulitis ([place]) 02/07/2015  . CKD (chronic kidney disease) 02/07/2015  . Sepsis ([place]) 02/06/2015  . Unspecified hereditary and idiopathic peripheral neuropathy 09/15/2013  . Hypertension, uncontrolled 09/14/2013  . Diabetes mellitus ([place]) 09/14/2013  . Malaise 08/14/2013  . Intractable nausea and vomiting 08/14/2013  . Acute gastroenteritis 08/14/2013  . Hypertension 08/14/2013  . Chronic back pain 08/14/2013  . Glaucoma 08/14/2013  . Headache 08/14/2013  . Hypertensive urgency 08/14/2013   Past Medical History:  Diagnosis Date  . Diabetes mellitus without complication ([place])   . Glaucoma   .  Hypertension     Family History  Problem Relation Age of Onset  . Diabetes Other   . Hyperlipidemia Other   . Hypertension Other   . Stroke Other   . Alzheimer's disease Other    Past Surgical History:  Procedure Laterality Date  . BACK SURGERY    . CYST EXCISION     on Back  . JOINT REPLACEMENT    . TOTAL HIP ARTHROPLASTY     Right    Social History   Occupational History  . Not on file.   Social History Main Topics  . Smoking status: Never Smoker  . Smokeless tobacco: Never Used  . Alcohol use No  . Drug use: No  . Sexual activity: Yes

## 2016-04-04 ENCOUNTER — Other Ambulatory Visit (INDEPENDENT_AMBULATORY_CARE_PROVIDER_SITE_OTHER): Payer: Self-pay

## 2016-04-04 ENCOUNTER — Telehealth (INDEPENDENT_AMBULATORY_CARE_PROVIDER_SITE_OTHER): Payer: Self-pay | Admitting: Orthopedic Surgery

## 2016-04-04 NOTE — Telephone Encounter (Signed)
David [name] called saying when Dr. Jess Barters assistant called the pharmacy on 11.15.17 to order the Doxycycline, the pharmacy told him they couldn't understand her message so they didn't fill the Rx. [facility] call [facility] [address] again to re-order the medication because it's not been filled and he's having problems with his toe. Please call the patient once this has been taken care of if possible.  Pt's ph# [phone] Thank you.

## 2016-04-04 NOTE — Telephone Encounter (Signed)
Called and lm on vm for pharm to advise of rx and to call if there are any questions about this rx.  I called the pt to advise that this has been done. Left message on vm for him as well. To call with questions.

## 2016-04-09 ENCOUNTER — Ambulatory Visit (INDEPENDENT_AMBULATORY_CARE_PROVIDER_SITE_OTHER): Payer: Medicare Other | Admitting: Orthopedic Surgery

## 2016-04-09 ENCOUNTER — Encounter (INDEPENDENT_AMBULATORY_CARE_PROVIDER_SITE_OTHER): Payer: Self-pay | Admitting: Orthopedic Surgery

## 2016-04-09 VITALS — Ht 74.0 in | Wt 229.0 lb

## 2016-04-09 DIAGNOSIS — L97511 Non-pressure chronic ulcer of other part of right foot limited to breakdown of skin: Secondary | ICD-10-CM | POA: Diagnosis not present

## 2016-04-09 DIAGNOSIS — E1142 Type 2 diabetes mellitus with diabetic polyneuropathy: Secondary | ICD-10-CM | POA: Diagnosis not present

## 2016-04-09 NOTE — Progress Notes (Signed)
Wound Care Note   Patient: David [name]           Date of Birth: [birth year]           MRN: [id number]             PCP: Eloise Levels, NP Visit Date: 04/09/2016   Assessment & Plan: Visit Diagnoses:  1. Diabetic polyneuropathy associated with type 2 diabetes mellitus ([place])   2. Right foot ulcer, limited to breakdown of skin ([place])     Plan: Recommended extra depth protective shoes. Patient will go to chew market. Patient will use a bike ointment and a Band-Aid dressing change the right great toe. Minimize his activities such as raking leaves.   Follow-Up Instructions: Return in about 4 weeks (around 05/07/2016).  Orders:  No orders of the defined types were placed in this encounter.  No orders of the defined types were placed in this encounter.     Procedures: No notes on file   Clinical Data: No additional findings.   No images are attached to the encounter.   Subjective: Chief Complaint  Patient presents with  . Right Foot - Wound Check    Patient returns for 2 week recheck right great toe. Still having some drainage, states he is turning ankle in the Darco shoe. Wearing compression stocking. States it look swollen to him. Patient is taking doxycycline 2 times daily.      Review of Systems  Miscellaneous:  -   Objective: Vital Signs: Ht 6\' 2"  (1.88 m)   Wt 229 lb (103.9 kg)   BMI 29.40 kg/m   Physical Exam: Examination patient is alert oriented no adenopathy well-dressed normal left second respiratory effort he does have an antalgic gait he does not have protective sensation in the right foot he has good pulses. He has a Wagner grade 1 ulcer over the tip of the right great toe after informed consent a 10 blade knife was used to debride the skin and soft tissue back to bleeding viable granulation tissue this was touched with silver nitrate. The ulcers 0.1 mm deep 15 mm in diameter this does not probe to bone or tendon.  Specialty Comments: No  specialty comments available.   PMFS History: Patient Active Problem List   Diagnosis Date Noted  . Diabetic polyneuropathy associated with type 2 diabetes mellitus ([place]) 04/09/2016  . Right foot ulcer, limited to breakdown of skin ([place]) 04/09/2016  . Sepsis due to cellulitis ([place]) 02/07/2015  . CKD (chronic kidney disease) 02/07/2015  . Sepsis ([place]) 02/06/2015  . Unspecified hereditary and idiopathic peripheral neuropathy 09/15/2013  . Hypertension, uncontrolled 09/14/2013  . Diabetes mellitus ([place]) 09/14/2013  . Malaise 08/14/2013  . Intractable nausea and vomiting 08/14/2013  . Acute gastroenteritis 08/14/2013  . Hypertension 08/14/2013  . Chronic back pain 08/14/2013  . Glaucoma 08/14/2013  . Headache 08/14/2013  . Hypertensive urgency 08/14/2013   Past Medical History:  Diagnosis Date  . Diabetes mellitus without complication ([place])   . Glaucoma   . Hypertension     Family History  Problem Relation Age of Onset  . Diabetes Other   . Hyperlipidemia Other   . Hypertension Other   . Stroke Other   . Alzheimer's disease Other    Past Surgical History:  Procedure Laterality Date  . BACK SURGERY    . CYST EXCISION     on Back  . JOINT REPLACEMENT    . TOTAL HIP ARTHROPLASTY     Right  Social History   Occupational History  . Not on file.   Social History Main Topics  . Smoking status: Never Smoker  . Smokeless tobacco: Never Used  . Alcohol use No  . Drug use: No  . Sexual activity: Yes

## 2016-04-15 DIAGNOSIS — Z23 Encounter for immunization: Secondary | ICD-10-CM | POA: Diagnosis not present

## 2016-04-16 ENCOUNTER — Ambulatory Visit (INDEPENDENT_AMBULATORY_CARE_PROVIDER_SITE_OTHER): Payer: Medicare Other | Admitting: Family

## 2016-04-16 VITALS — Ht 74.0 in | Wt 229.0 lb

## 2016-04-16 DIAGNOSIS — Z79891 Long term (current) use of opiate analgesic: Secondary | ICD-10-CM | POA: Diagnosis not present

## 2016-04-16 DIAGNOSIS — G894 Chronic pain syndrome: Secondary | ICD-10-CM | POA: Diagnosis not present

## 2016-04-16 DIAGNOSIS — E1142 Type 2 diabetes mellitus with diabetic polyneuropathy: Secondary | ICD-10-CM

## 2016-04-16 DIAGNOSIS — L97511 Non-pressure chronic ulcer of other part of right foot limited to breakdown of skin: Secondary | ICD-10-CM

## 2016-04-16 DIAGNOSIS — M5416 Radiculopathy, lumbar region: Secondary | ICD-10-CM | POA: Diagnosis not present

## 2016-04-16 DIAGNOSIS — M4726 Other spondylosis with radiculopathy, lumbar region: Secondary | ICD-10-CM | POA: Diagnosis not present

## 2016-04-16 NOTE — Progress Notes (Signed)
Office Visit Note   Patient: David [name]           Date of Birth: 04/20/1946           MRN: [id number] Visit Date: 04/16/2016              Requested by: Eloise Levels, NP [phone], [place] [address] PCP: Eloise Levels, NP   Assessment & Plan: Visit Diagnoses: No diagnosis found.  Plan: Have been advised that he discontinue cotton sock. He will wear his VIve compression stockings daily. May use antifungal powder between the toes. Will place a small drop of antibacterial ointment on the bandage and dressed the great toe with this. Have recommended that he offload pressure of the great toe with weightbearing.  Follow-Up Instructions: No Follow-up on file.   Orders:  No orders of the defined types were placed in this encounter.  No orders of the defined types were placed in this encounter.     Procedures: No procedures performed   Clinical Data: No additional findings.   Subjective: Chief Complaint  Patient presents with  . Right Foot - Wound Check    Great toe ulcer    Patient a 70 year old gentleman who is seen today as a walk in today for concern of worsening to ulcer to the tip of the great toe right foot.  He has returned to work. The pt is full weight bearing in a stiff sole shoe and ambulates with a cane.  No fevers chills or purulence.   Wound Check     Review of Systems  Constitutional: Negative for chills and fever.     Objective: Vital Signs: Ht 6\' 2"  (1.88 m)   Wt 229 lb (103.9 kg)   BMI 29.40 kg/m   Physical Exam  Constitutional: He is oriented to person, place, and time. He appears well-developed and well-nourished.  Pulmonary/Chest: Effort normal.  Neurological: He is alert and oriented to person, place, and time.  Skin:  There is a 1 cm in diameter ulceration to the tip of the great toe right foot. This is filled in with beefy granulation tissue. There is a ring of 5 mm of maceration surrounding this. There was some  surrounding callus which was pared with a #10 blade knife. This was discolored and dark. There is no gangrene. does have a palpable dp pulse.  There is maceration and dermatitis but between the toes.  Psychiatric: He has a normal mood and affect.  Nursing note reviewed.   Ortho Exam  Specialty Comments:  No specialty comments available.  Imaging: No results found.   PMFS History: Patient Active Problem List   Diagnosis Date Noted  . Diabetic polyneuropathy associated with type 2 diabetes mellitus ([place]) 04/09/2016  . Right foot ulcer, limited to breakdown of skin ([place]) 04/09/2016  . Sepsis due to cellulitis ([place]) 02/07/2015  . CKD (chronic kidney disease) 02/07/2015  . Sepsis ([place]) 02/06/2015  . Unspecified hereditary and idiopathic peripheral neuropathy 09/15/2013  . Hypertension, uncontrolled 09/14/2013  . Diabetes mellitus ([place]) 09/14/2013  . Malaise 08/14/2013  . Intractable nausea and vomiting 08/14/2013  . Acute gastroenteritis 08/14/2013  . Hypertension 08/14/2013  . Chronic back pain 08/14/2013  . Glaucoma 08/14/2013  . Headache 08/14/2013  . Hypertensive urgency 08/14/2013   Past Medical History:  Diagnosis Date  . Diabetes mellitus without complication ([place])   . Glaucoma   . Hypertension     Family History  Problem Relation Age of Onset  .  Diabetes Other   . Hyperlipidemia Other   . Hypertension Other   . Stroke Other   . Alzheimer's disease Other     Past Surgical History:  Procedure Laterality Date  . BACK SURGERY    . CYST EXCISION     on Back  . JOINT REPLACEMENT    . TOTAL HIP ARTHROPLASTY     Right    Social History   Occupational History  . Not on file.   Social History Main Topics  . Smoking status: Never Smoker  . Smokeless tobacco: Never Used  . Alcohol use No  . Drug use: No  . Sexual activity: Yes

## 2016-04-21 ENCOUNTER — Ambulatory Visit (INDEPENDENT_AMBULATORY_CARE_PROVIDER_SITE_OTHER): Payer: BC Managed Care – PPO | Admitting: Orthopedic Surgery

## 2016-05-08 ENCOUNTER — Ambulatory Visit (INDEPENDENT_AMBULATORY_CARE_PROVIDER_SITE_OTHER): Payer: BC Managed Care – PPO | Admitting: Orthopedic Surgery

## 2016-05-08 ENCOUNTER — Encounter (INDEPENDENT_AMBULATORY_CARE_PROVIDER_SITE_OTHER): Payer: Self-pay | Admitting: Orthopedic Surgery

## 2016-05-08 VITALS — Ht 74.0 in | Wt 229.0 lb

## 2016-05-08 DIAGNOSIS — E1142 Type 2 diabetes mellitus with diabetic polyneuropathy: Secondary | ICD-10-CM

## 2016-05-08 DIAGNOSIS — L97511 Non-pressure chronic ulcer of other part of right foot limited to breakdown of skin: Secondary | ICD-10-CM

## 2016-05-08 NOTE — Progress Notes (Signed)
Office Visit Note   Patient: David [name]           Date of Birth: Aug 17, 1945           MRN: [id number] Visit Date: 05/08/2016              Requested by: Eloise Levels, NP [phone], [place] [address] PCP: Eloise Levels, NP   Assessment & Plan: Visit Diagnoses:  1. Diabetic polyneuropathy associated with type 2 diabetes mellitus ([place])   2. Right foot ulcer, limited to breakdown of skin ([place])     Plan: Patient's ulcer has gotten larger and deeper. Ulcers debrided of skin and soft tissue today it is very close to being in contact with the bone. Patient will resume his oral doxycycline recommended probiotics recommended nonweightbearing dressing change daily follow up in 2 weeks.  Follow-Up Instructions: Return in about 2 weeks (around 05/22/2016).   Orders:  No orders of the defined types were placed in this encounter.  No orders of the defined types were placed in this encounter.     Procedures: No procedures performed   Clinical Data: No additional findings.   Subjective: Chief Complaint  Patient presents with  . Right Foot - Wound Check    right great toe ulcer.     Right great toe ulcer follow up. The pt is wearing a slide on shoe and a single layer ted hose. He is using antifungal powder and he is weight bearing with a quad cane. There is a thick hard callus at the tip of his great toe. No drainage and no dressing. He has no questions today.    Wound Check     Review of Systems patient states he has been up on his foot doing a lot of walking lately he denies any pain odor or symptoms.   Objective: Vital Signs: Ht 6\' 2"  (1.88 m)   Wt 229 lb (103.9 kg)   BMI 29.40 kg/m   Physical Exam examination patient is alert oriented no adenopathy well-dressed normal affect normal respiratory effort. Does not have protective sensation he does have antalgic gait he's got a good dorsalis pedis and posterior tibial pulse he does have venous stasis  changes but no ulcers. Examination the ulcer of the right great toe was gotten larger. After informed consent a 10 blade knife yes was used to debride the skin and soft tissue back to bleeding viable granulation tissue silver nitrate was used for hemostasis. The ulcer measures 2 cm in diameter and is 3 mm deep. There is good granulation tissue there is no exposed bone but the ulcer comes extremely close to the distal tuft of the right great toe.  Ortho Exam  Specialty Comments:  No specialty comments available.  Imaging: No results found.   PMFS History: Patient Active Problem List   Diagnosis Date Noted  . Diabetic polyneuropathy associated with type 2 diabetes mellitus ([place]) 04/09/2016  . Right foot ulcer, limited to breakdown of skin ([place]) 04/09/2016  . Sepsis due to cellulitis ([place]) 02/07/2015  . CKD (chronic kidney disease) 02/07/2015  . Sepsis ([place]) 02/06/2015  . Unspecified hereditary and idiopathic peripheral neuropathy 09/15/2013  . Hypertension, uncontrolled 09/14/2013  . Diabetes mellitus ([place]) 09/14/2013  . Malaise 08/14/2013  . Intractable nausea and vomiting 08/14/2013  . Acute gastroenteritis 08/14/2013  . Hypertension 08/14/2013  . Chronic back pain 08/14/2013  . Glaucoma 08/14/2013  . Headache 08/14/2013  . Hypertensive urgency 08/14/2013   Past Medical History:  Diagnosis Date  . Diabetes mellitus without complication ([place])   . Glaucoma   . Hypertension     Family History  Problem Relation Age of Onset  . Diabetes Other   . Hyperlipidemia Other   . Hypertension Other   . Stroke Other   . Alzheimer's disease Other     Past Surgical History:  Procedure Laterality Date  . BACK SURGERY    . CYST EXCISION     on Back  . JOINT REPLACEMENT    . TOTAL HIP ARTHROPLASTY     Right    Social History   Occupational History  . Not on file.   Social History Main Topics  . Smoking status: Never Smoker  . Smokeless tobacco: Never Used  . Alcohol use No    . Drug use: No  . Sexual activity: Yes

## 2016-05-14 ENCOUNTER — Telehealth (INDEPENDENT_AMBULATORY_CARE_PROVIDER_SITE_OTHER): Payer: Self-pay | Admitting: *Deleted

## 2016-05-14 NOTE — Telephone Encounter (Signed)
Pt came in today stating he needs doxycycline refilled. Pt states [facility] uses [facility] [place].

## 2016-05-14 NOTE — Telephone Encounter (Signed)
I called and left voicemail, advised there is no physician to ask for additional antibiotics. Reading last office note his wounds were looking worse. Based off this recommended he come in for office evaluation, made him appt for 10am tomorrow with MD. David [name] he call back again if this does not work for his schedule, but again advised that [name] under these circumstances would probably want to repeat eval sooner based off his dictation.

## 2016-05-15 ENCOUNTER — Ambulatory Visit (INDEPENDENT_AMBULATORY_CARE_PROVIDER_SITE_OTHER): Payer: BC Managed Care – PPO | Admitting: Orthopedic Surgery

## 2016-05-15 ENCOUNTER — Other Ambulatory Visit (INDEPENDENT_AMBULATORY_CARE_PROVIDER_SITE_OTHER): Payer: Self-pay | Admitting: Family

## 2016-05-15 ENCOUNTER — Telehealth (INDEPENDENT_AMBULATORY_CARE_PROVIDER_SITE_OTHER): Payer: Self-pay | Admitting: Orthopedic Surgery

## 2016-05-16 ENCOUNTER — Other Ambulatory Visit (INDEPENDENT_AMBULATORY_CARE_PROVIDER_SITE_OTHER): Payer: Self-pay | Admitting: Family

## 2016-05-16 DIAGNOSIS — L02611 Cutaneous abscess of right foot: Secondary | ICD-10-CM

## 2016-05-16 MED ORDER — DOXYCYCLINE HYCLATE 100 MG PO TABS: 100.0000 mg | ORAL_TABLET | Freq: Two times a day (BID) | ORAL | 0 refills | Status: DC

## 2016-05-16 NOTE — Telephone Encounter (Signed)
I sent a refill, if youll just tell him at pharmacy

## 2016-05-16 NOTE — Telephone Encounter (Signed)
Does he need a refill?

## 2016-05-16 NOTE — Telephone Encounter (Signed)
I called pt and lm on vm to advise that rx for doxy has been faxed to pharm.

## 2016-05-29 ENCOUNTER — Ambulatory Visit (INDEPENDENT_AMBULATORY_CARE_PROVIDER_SITE_OTHER): Payer: Medicare Other | Admitting: Orthopedic Surgery

## 2016-05-29 ENCOUNTER — Ambulatory Visit (INDEPENDENT_AMBULATORY_CARE_PROVIDER_SITE_OTHER): Payer: Medicare Other

## 2016-05-29 ENCOUNTER — Encounter (INDEPENDENT_AMBULATORY_CARE_PROVIDER_SITE_OTHER): Payer: Self-pay | Admitting: Orthopedic Surgery

## 2016-05-29 VITALS — Ht 74.0 in | Wt 229.0 lb

## 2016-05-29 DIAGNOSIS — M86271 Subacute osteomyelitis, right ankle and foot: Secondary | ICD-10-CM

## 2016-05-29 DIAGNOSIS — B351 Tinea unguium: Secondary | ICD-10-CM | POA: Insufficient documentation

## 2016-05-29 NOTE — Progress Notes (Signed)
Office Visit Note   Patient: David [name]           Date of Birth: 28-Jul-1945           MRN: [id number] Visit Date: 05/29/2016              Requested by: Eloise Levels, NP [phone], [place] [address] PCP: Eloise Levels, NP  Chief Complaint  Patient presents with  . Right Foot - Follow-up    Ulcer check great toe    HPI: Patient is weight bearing with a cane and wearing a slide on wool lined shoe. He states that he is not having any pain. He is taking his Doxy bid and covering the ulcer with a band aid. 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 Pamella Pert, RMA     Assessment & Plan:   Visit Diagnoses:  1. Subacute osteomyelitis, right ankle and foot ([place])   2. Onychomycosis     Plan: Discussed with the patient we will need to proceed with amputation of the right great toe at the MTP joint. He has destructive osteomyelitis. This does not extend up into the foot. Recommended proceeding with a great toe amputation the MTP joint. Patient elected to proceed as outpatient surgery with a regional block. Risk and benefits were discussed including risk of the wound not healing. Follow up in the office 2 weeks postoperatively. Patient also has thickened discolored onychomycotic nails 9 and the nails are trimmed 9 without complications.  Follow-Up Instructions: Return in about 2 weeks (around 06/12/2016).   Ortho Exam Patient is alert oriented no adenopathy well-dressed normal affect normal respiratory effort.  Patient has a good dorsalis pedis pulse bilaterally he has venous stasis changes but no ascending cellulitis no leg ulcers. He has a Wagner grade 3 ulcer of the right great toe this probes to bone. Radiograph shows destructive bony changes. Ulcer is 20 mm in diameter and 5 mm deep. Patient has thickened discolored onychomycotic  nails times He is unable to safely trim the nails on his own and nails are trimmed 9 without, location's.  Imaging: Xr Foot Complete Right  Result Date: 05/29/2016 Three-view radiographs of the right foot shows destructive osteomyelitis of the tuft of the great toe. The silver nitrate penetrates down to the bone.   Orders:  Orders Placed This Encounter  Procedures  . XR Foot Complete Right   No orders of the defined types were placed in this encounter.    Procedures: No procedures performed  Clinical Data: No additional findings.  Subjective: Review of Systems  Objective: Vital Signs: Ht 6\' 2"  (1.88 m)   Wt 229 lb (103.9 kg)   BMI 29.40 kg/m   Specialty Comments:  No specialty comments available.  PMFS History: Patient Active Problem List   Diagnosis Date Noted  . Subacute osteomyelitis, right ankle and foot ([place]) 05/29/2016  . Onychomycosis 05/29/2016  . Diabetic polyneuropathy associated with type 2 diabetes mellitus ([place]) 04/09/2016  . Right foot ulcer, limited to breakdown of skin ([place]) 04/09/2016  . Sepsis due to cellulitis ([place]) 02/07/2015  . CKD (chronic kidney disease) 02/07/2015  . Sepsis ([place]) 02/06/2015  . Unspecified hereditary and idiopathic peripheral neuropathy 09/15/2013  . Hypertension, uncontrolled 09/14/2013  . Diabetes mellitus ([place]) 09/14/2013  . Malaise 08/14/2013  . Intractable nausea and  vomiting 08/14/2013  . Acute gastroenteritis 08/14/2013  . Hypertension 08/14/2013  . Chronic back pain 08/14/2013  . Glaucoma 08/14/2013  . Headache 08/14/2013  . Hypertensive urgency 08/14/2013   Past Medical History:  Diagnosis Date  . Diabetes mellitus without complication ([place])   . Glaucoma   . Hypertension     Family History  Problem Relation Age of Onset  . Diabetes Other   . Hyperlipidemia Other   . Hypertension Other   . Stroke Other   . Alzheimer's disease Other     Past Surgical History:  Procedure Laterality Date  . BACK  SURGERY    . CYST EXCISION     on Back  . JOINT REPLACEMENT    . TOTAL HIP ARTHROPLASTY     Right    Social History   Occupational History  . Not on file.   Social History Main Topics  . Smoking status: Never Smoker  . Smokeless tobacco: Never Used  . Alcohol use No  . Drug use: No  . Sexual activity: Yes

## 2016-06-07 ENCOUNTER — Other Ambulatory Visit (INDEPENDENT_AMBULATORY_CARE_PROVIDER_SITE_OTHER): Payer: Self-pay | Admitting: Family

## 2016-06-10 ENCOUNTER — Ambulatory Visit (INDEPENDENT_AMBULATORY_CARE_PROVIDER_SITE_OTHER): Payer: Medicare Other | Admitting: Orthopedic Surgery

## 2016-06-13 ENCOUNTER — Other Ambulatory Visit (INDEPENDENT_AMBULATORY_CARE_PROVIDER_SITE_OTHER): Payer: Self-pay | Admitting: Family

## 2016-06-17 ENCOUNTER — Ambulatory Visit (INDEPENDENT_AMBULATORY_CARE_PROVIDER_SITE_OTHER): Payer: Medicare Other | Admitting: Orthopedic Surgery

## 2016-06-17 DIAGNOSIS — E1142 Type 2 diabetes mellitus with diabetic polyneuropathy: Secondary | ICD-10-CM | POA: Diagnosis not present

## 2016-06-17 DIAGNOSIS — M86271 Subacute osteomyelitis, right ankle and foot: Secondary | ICD-10-CM | POA: Diagnosis not present

## 2016-06-17 NOTE — Progress Notes (Signed)
Office Visit Note   Patient: David [name]           Date of Birth: [birth year]           MRN: [id number] Visit Date: 06/17/2016              Requested by: Eloise Levels, NP [phone], [place] [address] PCP: Eloise Levels, NP  Chief Complaint  Patient presents with  . Right Foot - Follow-up    HPI: Patient is because he does not want to have surgery that is scheduled for Feb. He states that his toe is healing and he does not want to proceed with amputation. He is using neosporin to treat and in a slide on shoe. Patient states " I dont want to let anyone cut me" he says that he is doing much better with the antibiotics and wants to wait and see how he does. Pamella Pert, RMA    Assessment & Plan: Visit Diagnoses:  1. Diabetic polyneuropathy associated with type 2 diabetes mellitus ([place])   2. Subacute osteomyelitis, right ankle and foot ([place])     Plan: Discussed proceeding with amputation of the right great toe. MTP joint. Risk and benefits were discussed including nonhealing of the wound potential for transtibial amputation. Patient states he understands wishes to proceed this time follow-up the office 2 weeks postoperatively.  Follow-Up Instructions: Return in about 2 weeks (around 07/01/2016).   Ortho Exam Examination patient is alert oriented 9 open well-dressed normal affect normal respiratory effort he does have an antalgic gait he has a strong dorsalis pedis pulse he has venous stasis swelling he has a callus beneath the fifth metatarsal head after informed consent 10 blade knife was used to repair the callus. Patient has a Wagner grade 3 ulcer of the tip of the great toe the callus was removed the ulcer probes to bone. Patient has sausage digit swelling of the great toe but no ascending cellulitis.  Imaging: No results found.  Orders:  No orders of the defined types were placed in this encounter.  No orders of the defined types were placed in this  encounter.    Procedures: No procedures performed  Clinical Data: No additional findings.  Subjective: Review of Systems  Objective: Vital Signs: There were no vitals taken for this visit.  Specialty Comments:  No specialty comments available.  PMFS History: Patient Active Problem List   Diagnosis Date Noted  . Subacute osteomyelitis, right ankle and foot ([place]) 05/29/2016  . Onychomycosis 05/29/2016  . Diabetic polyneuropathy associated with type 2 diabetes mellitus ([place]) 04/09/2016  . Right foot ulcer, limited to breakdown of skin ([place]) 04/09/2016  . Sepsis due to cellulitis ([place]) 02/07/2015  . CKD (chronic kidney disease) 02/07/2015  . Sepsis ([place]) 02/06/2015  . Unspecified hereditary and idiopathic peripheral neuropathy 09/15/2013  . Hypertension, uncontrolled 09/14/2013  . Diabetes mellitus ([place]) 09/14/2013  . Malaise 08/14/2013  . Intractable nausea and vomiting 08/14/2013  . Acute gastroenteritis 08/14/2013  . Hypertension 08/14/2013  . Chronic back pain 08/14/2013  . Glaucoma 08/14/2013  . Headache 08/14/2013  . Hypertensive urgency 08/14/2013   Past Medical History:  Diagnosis Date  . Diabetes mellitus without complication ([place])   . Glaucoma   . Hypertension     Family History  Problem Relation Age of Onset  . Diabetes Other   . Hyperlipidemia Other   . Hypertension Other   . Stroke Other   . Alzheimer's disease Other  Past Surgical History:  Procedure Laterality Date  . BACK SURGERY    . CYST EXCISION     on Back  . JOINT REPLACEMENT    . TOTAL HIP ARTHROPLASTY     Right    Social History   Occupational History  . Not on file.   Social History Main Topics  . Smoking status: Never Smoker  . Smokeless tobacco: Never Used  . Alcohol use No  . Drug use: No  . Sexual activity: Yes

## 2016-06-26 ENCOUNTER — Encounter (INDEPENDENT_AMBULATORY_CARE_PROVIDER_SITE_OTHER): Payer: Self-pay | Admitting: Orthopedic Surgery

## 2016-06-26 ENCOUNTER — Ambulatory Visit (INDEPENDENT_AMBULATORY_CARE_PROVIDER_SITE_OTHER): Payer: Medicare Other | Admitting: Family

## 2016-06-26 VITALS — Ht 74.0 in | Wt 229.0 lb

## 2016-06-26 DIAGNOSIS — E1142 Type 2 diabetes mellitus with diabetic polyneuropathy: Secondary | ICD-10-CM

## 2016-06-26 DIAGNOSIS — M86271 Subacute osteomyelitis, right ankle and foot: Secondary | ICD-10-CM

## 2016-06-26 NOTE — Progress Notes (Signed)
Office Visit Note   Patient: David [name]           Date of Birth: April 21, 1946           MRN: [id number] Visit Date: 06/26/2016              Requested by: Eloise Levels, NP [phone], [place] [address] PCP: Eloise Levels, NP  Chief Complaint  Patient presents with  . Right Foot - Follow-up    Right foot great toe ulcer    HPI: Pt is scheduled for a right great toe amputation in 2 weeks. He is covering the ulcer with a band aid and neosporin. He is weight bearing in a regular shoe with a cane. Wearing Tommy Copper compression stockings. Would like to discuss surgery pre and post instructions.      Assessment & Plan: Visit Diagnoses: No diagnosis found.  Plan: He will continue with the compression stockings daily. Continue with daily wound care regimen. Follow up 2 weeks post op. Malachy Mood to discuss what to expect on surgery day with the patient.   Follow-Up Instructions: No Follow-up on file.   Ortho Exam Physical Exam  Constitutional: Appears well-developed.  Head: Normocephalic.  Eyes: EOM are normal.  Neck: Normal range of motion.  Cardiovascular: Normal rate.   Pulmonary/Chest: Effort normal.  Neurological: Is alert.  Skin: Skin is warm.  Psychiatric: Has a normal mood and affect. Right foot: great ulceration to distal tip, this is 15 mm in diameter. Wound bed flat and pink. Toe is swollen. No warmth. No ascending cellulitis. No drainage.   Imaging: No results found.  Orders:  No orders of the defined types were placed in this encounter.  No orders of the defined types were placed in this encounter.    Procedures: No procedures performed  Clinical Data: No additional findings.  Subjective: Review of Systems  Constitutional: Negative for chills and fever.  Skin: Positive for wound.    Objective: Vital Signs: There were no vitals taken for this visit.  Specialty Comments:  No specialty comments available.  PMFS History: Patient  Active Problem List   Diagnosis Date Noted  . Subacute osteomyelitis, right ankle and foot ([place]) 05/29/2016  . Onychomycosis 05/29/2016  . Diabetic polyneuropathy associated with type 2 diabetes mellitus ([place]) 04/09/2016  . Right foot ulcer, limited to breakdown of skin ([place]) 04/09/2016  . Sepsis due to cellulitis ([place]) 02/07/2015  . CKD (chronic kidney disease) 02/07/2015  . Sepsis ([place]) 02/06/2015  . Unspecified hereditary and idiopathic peripheral neuropathy 09/15/2013  . Hypertension, uncontrolled 09/14/2013  . Diabetes mellitus ([place]) 09/14/2013  . Malaise 08/14/2013  . Intractable nausea and vomiting 08/14/2013  . Acute gastroenteritis 08/14/2013  . Hypertension 08/14/2013  . Chronic back pain 08/14/2013  . Glaucoma 08/14/2013  . Headache 08/14/2013  . Hypertensive urgency 08/14/2013   Past Medical History:  Diagnosis Date  . Diabetes mellitus without complication ([place])   . Glaucoma   . Hypertension     Family History  Problem Relation Age of Onset  . Diabetes Other   . Hyperlipidemia Other   . Hypertension Other   . Stroke Other   . Alzheimer's disease Other     Past Surgical History:  Procedure Laterality Date  . BACK SURGERY    . CYST EXCISION     on Back  . JOINT REPLACEMENT    . TOTAL HIP ARTHROPLASTY     Right    Social History   Occupational History  .  Not on file.   Social History Main Topics  . Smoking status: Never Smoker  . Smokeless tobacco: Never Used  . Alcohol use No  . Drug use: No  . Sexual activity: Yes

## 2016-07-08 ENCOUNTER — Encounter (HOSPITAL_COMMUNITY): Payer: Self-pay | Admitting: *Deleted

## 2016-07-08 NOTE — Progress Notes (Signed)
Mr Huot denies chest pain or shortness of breath.  Patient reports that CBG's run less than 120 since he has been on Trulicity,.  I instructed patient to check CBG to check CBG and if it is less than 70 to treat it with = 1/2 cup of clear juice like apple juice or cranberry juice, patient verified "no organge juice" I instructed patient to recheck CBG in 15 minutes and if CBG is not greater than 70, to  Call [phone] (pre- op). If it is before pre-op opens to retreat as before and recheck CBG in 15 minutes. I told patient to make note of time that liquid is taken and amount, that surgical time may have to be adjusted.

## 2016-07-09 ENCOUNTER — Encounter (HOSPITAL_COMMUNITY)
Admission: RE | Disposition: A | Discharge status: Home / Self Care | Payer: Self-pay | Source: Ambulatory Visit | Attending: Orthopedic Surgery

## 2016-07-09 ENCOUNTER — Ambulatory Visit (HOSPITAL_COMMUNITY): Payer: Medicare Other | Admitting: Certified Registered Nurse Anesthetist

## 2016-07-09 ENCOUNTER — Encounter (HOSPITAL_COMMUNITY): Payer: Self-pay | Admitting: *Deleted

## 2016-07-09 ENCOUNTER — Ambulatory Visit (HOSPITAL_COMMUNITY)
Admission: RE | Admit: 2016-07-09 | Discharge: 2016-07-09 | Disposition: A | Discharge status: Home / Self Care | Payer: Medicare Other | Source: Ambulatory Visit | Attending: Orthopedic Surgery | Admitting: Orthopedic Surgery

## 2016-07-09 DIAGNOSIS — L97516 Non-pressure chronic ulcer of other part of right foot with bone involvement without evidence of necrosis: Secondary | ICD-10-CM | POA: Insufficient documentation

## 2016-07-09 DIAGNOSIS — M869 Osteomyelitis, unspecified: Secondary | ICD-10-CM | POA: Diagnosis present

## 2016-07-09 DIAGNOSIS — Z794 Long term (current) use of insulin: Secondary | ICD-10-CM | POA: Insufficient documentation

## 2016-07-09 DIAGNOSIS — E1169 Type 2 diabetes mellitus with other specified complication: Secondary | ICD-10-CM | POA: Diagnosis not present

## 2016-07-09 DIAGNOSIS — K219 Gastro-esophageal reflux disease without esophagitis: Secondary | ICD-10-CM | POA: Insufficient documentation

## 2016-07-09 DIAGNOSIS — Z7982 Long term (current) use of aspirin: Secondary | ICD-10-CM | POA: Insufficient documentation

## 2016-07-09 DIAGNOSIS — I10 Essential (primary) hypertension: Secondary | ICD-10-CM | POA: Insufficient documentation

## 2016-07-09 DIAGNOSIS — Z96641 Presence of right artificial hip joint: Secondary | ICD-10-CM | POA: Diagnosis not present

## 2016-07-09 DIAGNOSIS — E1140 Type 2 diabetes mellitus with diabetic neuropathy, unspecified: Secondary | ICD-10-CM | POA: Diagnosis not present

## 2016-07-09 DIAGNOSIS — E11621 Type 2 diabetes mellitus with foot ulcer: Secondary | ICD-10-CM | POA: Diagnosis not present

## 2016-07-09 DIAGNOSIS — M86671 Other chronic osteomyelitis, right ankle and foot: Secondary | ICD-10-CM | POA: Diagnosis not present

## 2016-07-09 DIAGNOSIS — M86271 Subacute osteomyelitis, right ankle and foot: Secondary | ICD-10-CM

## 2016-07-09 DIAGNOSIS — Z79899 Other long term (current) drug therapy: Secondary | ICD-10-CM | POA: Insufficient documentation

## 2016-07-09 HISTORY — DX: Gastro-esophageal reflux disease without esophagitis: K21.9

## 2016-07-09 HISTORY — DX: Type 2 diabetes mellitus with diabetic neuropathy, unspecified: E11.40

## 2016-07-09 HISTORY — DX: Unspecified osteoarthritis, unspecified site: M19.90

## 2016-07-09 HISTORY — PX: AMPUTATION: SHX166

## 2016-07-09 LAB — CBC
HCT: 33.3 % — ABNORMAL LOW (ref 39.0–52.0)
HEMOGLOBIN: 11 g/dL — AB (ref 13.0–17.0)
MCH: 34.2 pg — AB (ref 26.0–34.0)
MCHC: 33 g/dL (ref 30.0–36.0)
MCV: 103.4 fL — AB (ref 78.0–100.0)
Platelets: 241 10*3/uL (ref 150–400)
RBC: 3.22 MIL/uL — AB (ref 4.22–5.81)
RDW: 11.8 % (ref 11.5–15.5)
WBC: 7.7 10*3/uL (ref 4.0–10.5)

## 2016-07-09 LAB — SURGICAL PCR SCREEN
MRSA, PCR: NEGATIVE
Staphylococcus aureus: NEGATIVE

## 2016-07-09 LAB — BASIC METABOLIC PANEL
Anion gap: 11 (ref 5–15)
BUN: 19 mg/dL (ref 6–20)
CHLORIDE: 102 mmol/L (ref 101–111)
CO2: 26 mmol/L (ref 22–32)
Calcium: 9 mg/dL (ref 8.9–10.3)
Creatinine, Ser: 1.25 mg/dL — ABNORMAL HIGH (ref 0.61–1.24)
GFR calc Af Amer: >60 mL/min (ref >60)
GFR calc non Af Amer: 57 mL/min — ABNORMAL LOW (ref >60)
Glucose, Bld: 212 mg/dL — ABNORMAL HIGH (ref 65–99)
POTASSIUM: 4 mmol/L (ref 3.5–5.1)
SODIUM: 139 mmol/L (ref 135–145)

## 2016-07-09 LAB — GLUCOSE, CAPILLARY
GLUCOSE-CAPILLARY: 215 mg/dL — AB (ref 65–99)
Glucose-Capillary: 162 mg/dL — ABNORMAL HIGH (ref 65–99)

## 2016-07-09 SURGERY — AMPUTATION DIGIT
Anesthesia: General | Site: Toe | Laterality: Right

## 2016-07-09 MED ORDER — SUCCINYLCHOLINE CHLORIDE 20 MG/ML IJ SOLN
INTRAMUSCULAR | Status: DC | PRN
  Administered 2016-07-09: 100 mg via INTRAVENOUS

## 2016-07-09 MED ORDER — LACTATED RINGERS IV SOLN
INTRAVENOUS | Status: DC | PRN
  Administered 2016-07-09: 08:00:00 via INTRAVENOUS

## 2016-07-09 MED ORDER — MIDAZOLAM HCL 2 MG/2ML IJ SOLN
INTRAMUSCULAR | Status: AC
  Filled 2016-07-09: qty 2

## 2016-07-09 MED ORDER — 0.9 % SODIUM CHLORIDE (POUR BTL) OPTIME
TOPICAL | Status: DC | PRN
  Administered 2016-07-09: 1000 mL

## 2016-07-09 MED ORDER — CHLORHEXIDINE GLUCONATE 4 % EX LIQD: 60.0000 mL | Freq: Once | CUTANEOUS | Status: DC

## 2016-07-09 MED ORDER — MIDAZOLAM HCL 5 MG/5ML IJ SOLN
INTRAMUSCULAR | Status: DC | PRN
  Administered 2016-07-09: 2 mg via INTRAVENOUS

## 2016-07-09 MED ORDER — PROPOFOL 10 MG/ML IV BOLUS
INTRAVENOUS | Status: DC | PRN
  Administered 2016-07-09: 200 mg via INTRAVENOUS

## 2016-07-09 MED ORDER — LIDOCAINE HCL (CARDIAC) 20 MG/ML IV SOLN
INTRAVENOUS | Status: DC | PRN
  Administered 2016-07-09: 100 mg via INTRAVENOUS

## 2016-07-09 MED ORDER — FENTANYL CITRATE (PF) 100 MCG/2ML IJ SOLN
INTRAMUSCULAR | Status: DC | PRN
  Administered 2016-07-09 (×2): 50 ug via INTRAVENOUS

## 2016-07-09 MED ORDER — FENTANYL CITRATE (PF) 100 MCG/2ML IJ SOLN
INTRAMUSCULAR | Status: AC
  Filled 2016-07-09: qty 2

## 2016-07-09 MED ORDER — FENTANYL CITRATE (PF) 100 MCG/2ML IJ SOLN: 25.0000 ug | INTRAMUSCULAR | Status: DC | PRN

## 2016-07-09 MED ORDER — PROPOFOL 10 MG/ML IV BOLUS
INTRAVENOUS | Status: AC
  Filled 2016-07-09: qty 20

## 2016-07-09 MED ORDER — CEFAZOLIN SODIUM-DEXTROSE 2-4 GM/100ML-% IV SOLN
2.0000 g | INTRAVENOUS | Status: AC
  Administered 2016-07-09: 2 g via INTRAVENOUS
  Filled 2016-07-09: qty 100

## 2016-07-09 SURGICAL SUPPLY — 27 items
BLADE SURG 21 STRL SS (BLADE) ×2 IMPLANT
BNDG COHESIVE 4X5 TAN STRL (GAUZE/BANDAGES/DRESSINGS) ×2 IMPLANT
BNDG ESMARK 4X9 LF (GAUZE/BANDAGES/DRESSINGS) IMPLANT
BNDG GAUZE ELAST 4 BULKY (GAUZE/BANDAGES/DRESSINGS) ×2 IMPLANT
COVER SURGICAL LIGHT HANDLE (MISCELLANEOUS) ×4 IMPLANT
DRAPE U-SHAPE 47X51 STRL (DRAPES) ×2 IMPLANT
DRSG ADAPTIC 3X8 NADH LF (GAUZE/BANDAGES/DRESSINGS) IMPLANT
DRSG PAD ABDOMINAL 8X10 ST (GAUZE/BANDAGES/DRESSINGS) ×2 IMPLANT
DURAPREP 26ML APPLICATOR (WOUND CARE) ×2 IMPLANT
ELECT REM PT RETURN 9FT ADLT (ELECTROSURGICAL) ×2
ELECTRODE REM PT RTRN 9FT ADLT (ELECTROSURGICAL) ×1 IMPLANT
GAUZE SPONGE 4X4 12PLY STRL (GAUZE/BANDAGES/DRESSINGS) IMPLANT
GLOVE BIOGEL PI IND STRL 9 (GLOVE) ×1 IMPLANT
GLOVE BIOGEL PI INDICATOR 9 (GLOVE) ×1
GLOVE SURG ORTHO 9.0 STRL STRW (GLOVE) ×2 IMPLANT
GOWN STRL REUS W/ TWL XL LVL3 (GOWN DISPOSABLE) ×2 IMPLANT
GOWN STRL REUS W/TWL XL LVL3 (GOWN DISPOSABLE) ×2
KIT BASIN OR (CUSTOM PROCEDURE TRAY) ×2 IMPLANT
KIT ROOM TURNOVER OR (KITS) ×2 IMPLANT
MANIFOLD NEPTUNE II (INSTRUMENTS) ×2 IMPLANT
NEEDLE 22X1 1/2 (OR ONLY) (NEEDLE) IMPLANT
NS IRRIG 1000ML POUR BTL (IV SOLUTION) ×2 IMPLANT
PACK ORTHO EXTREMITY (CUSTOM PROCEDURE TRAY) ×2 IMPLANT
PAD ARMBOARD 7.5X6 YLW CONV (MISCELLANEOUS) ×4 IMPLANT
SUT ETHILON 2 0 PSLX (SUTURE) ×2 IMPLANT
SYR CONTROL 10ML LL (SYRINGE) IMPLANT
TOWEL OR 17X26 10 PK STRL BLUE (TOWEL DISPOSABLE) ×2 IMPLANT

## 2016-07-09 NOTE — Transfer of Care (Signed)
Immediate Anesthesia Transfer of Care Note  Patient: David [name]  Procedure(s) Performed: Procedure(s): Right Great Toe Amputation at Metatarsophalangeal Joint (Right)  Patient Location: PACU  Anesthesia Type:General  Level of Consciousness: awake, alert , oriented and patient cooperative  Airway & Oxygen Therapy: Patient Spontanous Breathing and Patient connected to face mask oxygen  Post-op Assessment: Report given to RN and Post -op Vital signs reviewed and stable  Post vital signs: Reviewed and stable  Last Vitals:  Vitals:   07/09/16 0759 07/09/16 0909  BP: (!) 161/82   Pulse: 79   Resp: 20   Temp: 36.9 C 36.9 C    Last Pain:  Vitals:   07/09/16 0759  TempSrc: Oral  PainSc:       Patients Stated Pain Goal: 9 (58/52/77 8242)  Complications: No apparent anesthesia complications

## 2016-07-09 NOTE — Anesthesia Preprocedure Evaluation (Signed)
Anesthesia Evaluation  Patient identified by MRN, date of birth, ID band Patient awake    Reviewed: Allergy & Precautions, NPO status , Patient's Chart, lab work & pertinent test results  Airway Mallampati: II  TM Distance: >3 FB     Dental   Pulmonary    breath sounds clear to auscultation       Cardiovascular hypertension,  Rhythm:Regular Rate:Normal     Neuro/Psych  Headaches,  Neuromuscular disease    GI/Hepatic GERD  ,  Endo/Other  diabetes  Renal/GU Renal disease     Musculoskeletal  (+) Arthritis ,   Abdominal   Peds  Hematology   Anesthesia Other Findings   Reproductive/Obstetrics                             Anesthesia Physical Anesthesia Plan  ASA: III  Anesthesia Plan: General   Post-op Pain Management:    Induction: Intravenous  Airway Management Planned: LMA  Additional Equipment:   Intra-op Plan:   Post-operative Plan: Extubation in OR  Informed Consent: I have reviewed the patients History and Physical, chart, labs and discussed the procedure including the risks, benefits and alternatives for the proposed anesthesia with the patient or authorized representative who has indicated his/her understanding and acceptance.   Dental advisory given  Plan Discussed with: CRNA and Anesthesiologist  Anesthesia Plan Comments:         Anesthesia Quick Evaluation

## 2016-07-09 NOTE — Op Note (Signed)
07/09/2016  8:52 AM  PATIENT:  David [name]    PRE-OPERATIVE DIAGNOSIS:  Osteomeylitis Right Great Toe  POST-OPERATIVE DIAGNOSIS:  Same  PROCEDURE:  Right Great Toe Amputation at Metatarsophalangeal Joint  SURGEON:  [name], MD  PHYSICIAN ASSISTANT:None ANESTHESIA:   General  PREOPERATIVE INDICATIONS:  [name] is a  71 y.o. male with a diagnosis of Osteomeylitis Right Great Toe who failed conservative measures and elected for surgical management.    The risks benefits and alternatives were discussed with the patient preoperatively including but not limited to the risks of infection, bleeding, nerve injury, cardiopulmonary complications, the need for revision surgery, among others, and the patient was willing to proceed.  OPERATIVE IMPLANTS: None  OPERATIVE FINDINGS: Calcified vessels  OPERATIVE PROCEDURE: Patient brought to the operating room and underwent a general anesthetic. After adequate levels anesthesia obtained patient's right lower extremity was prepped using DuraPrep draped into a sterile field. A timeout was called. A fishmouth incision was made just distal to the MTP joint. The great toe was amputation through the MTP joint. Electrocautery was used for hemostasis. Wound was irrigated with normal saline. Incision closed using 2-0 nylon. A sterile dressing was applied patient was extubated taken the PACU in stable condition plan for discharge to home.

## 2016-07-09 NOTE — Progress Notes (Signed)
Orthopedic Tech Progress Note Patient Details:  David [name] 1946-02-07 [id number]  Ortho Devices Type of Ortho Device: Postop shoe/boot Ortho Device/Splint Location: rle Ortho Device/Splint Interventions: Application   Hildred Priest 07/09/2016, 9:35 AM Viewed order from doctor's order list

## 2016-07-09 NOTE — Anesthesia Procedure Notes (Signed)
Procedure Name: Intubation Date/Time: 07/09/2016 8:44 AM Performed by: Shirlyn Goltz Pre-anesthesia Checklist: Patient identified, Emergency Drugs available, Suction available and Patient being monitored Patient Re-evaluated:Patient Re-evaluated prior to inductionOxygen Delivery Method: Circle system utilized Preoxygenation: Pre-oxygenation with 100% oxygen Intubation Type: IV induction Ventilation: Mask ventilation without difficulty Laryngoscope Size: Mac and 4 Grade View: Grade I Tube type: Oral Tube size: 7.0 mm Number of attempts: 1 Airway Equipment and Method: Stylet Placement Confirmation: ETT inserted through vocal cords under direct vision,  positive ETCO2 and breath sounds checked- equal and bilateral Secured at: 21 cm Tube secured with: Tape Dental Injury: Teeth and Oropharynx as per pre-operative assessment

## 2016-07-09 NOTE — H&P (Signed)
David [name] is an 71 y.o. male.   Chief Complaint: Osteomyelitis right great toe HPI: Patient is a 71 year old gentleman diabetic insensate neuropathy with ulceration and chronic osteomyelitis of the right great toe that is failed conservative treatment.  Past Medical History:  Diagnosis Date  . Arthritis   . Diabetes mellitus without complication ([place])    type II  . Diabetic neuropathy ([place])   . GERD (gastroesophageal reflux disease)   . Glaucoma   . Hypertension     Past Surgical History:  Procedure Laterality Date  . BACK SURGERY     4  . CYST EXCISION     on Back  . JOINT REPLACEMENT    . TOTAL HIP ARTHROPLASTY     Right     Family History  Problem Relation Age of Onset  . Diabetes Other   . Hyperlipidemia Other   . Hypertension Other   . Stroke Other   . Alzheimer's disease Other    Social History:  reports that he has never smoked. He has never used smokeless tobacco. He reports that he does not drink alcohol or use drugs.  Allergies:  Allergies  Allergen Reactions  . No Known Allergies     Medications Prior to Admission  Medication Sig Dispense Refill  . amLODipine (NORVASC) 10 MG tablet Take 1 tablet (10 mg total) by mouth daily. 30 tablet 1  . aspirin EC 81 MG tablet Take 81 mg by mouth daily.    . chlorthalidone (HYGROTON) 25 MG tablet Take 25 mg by mouth every morning.  5  . docusate sodium (COLACE) 100 MG capsule Take 100 mg by mouth daily.    Marland Kitchen doxycycline (VIBRA-TABS) 100 MG tablet TAKE 1 TABLET BY MOUTH TWICE A DAY 60 tablet 0  . gabapentin (NEURONTIN) 600 MG tablet Take 600 mg by mouth 2 (two) times daily.     Marland Kitchen HYDROcodone-acetaminophen (NORCO) 10-325 MG tablet Take 1 tablet by mouth 2 (two) times daily as needed for moderate pain.   0  . LEVEMIR FLEXTOUCH 100 UNIT/ML Pen Inject 55 Units into the skin daily.     . pantoprazole (PROTONIX) 40 MG tablet Take 40 mg by mouth daily.  2  . TRULICITY 1.5 BJ/4.7WG SOPN Take 1.5 mg by mouth every  Sunday.  11    No results found for this or any previous visit (from the past 48 hour(s)). No results found.  Review of Systems  All other systems reviewed and are negative.   There were no vitals taken for this visit. Physical Exam  Examination patient is alert oriented no adenopathy well-dressed normal affect normal respiratory effort is normal gait. He has good pulses. He does not have protective sensation. Ulcer probes to bone right great toe. Assessment/Plan Assessment: Diabetic insensate neuropathy Wagner grade 3 ulcer with ostium myelitis right great toe.  Plan: We'll plan for amputation right great toe through the MTP joint. Risk and benefits were discussed including risk of the wound not healing need for additional surgery. Patient states he understands wish to proceed at this time.  [name], MD 07/09/2016, 6:39 AM

## 2016-07-10 ENCOUNTER — Encounter (HOSPITAL_COMMUNITY): Payer: Self-pay | Admitting: Orthopedic Surgery

## 2016-07-11 NOTE — Anesthesia Postprocedure Evaluation (Signed)
Anesthesia Post Note  Patient: AXLE PARFAIT  Procedure(s) Performed: Procedure(s) (LRB): Right Great Toe Amputation at Metatarsophalangeal Joint (Right)  Patient location during evaluation: PACU Anesthesia Type: General Level of consciousness: awake Pain management: pain level controlled Vital Signs Assessment: post-procedure vital signs reviewed and stable Respiratory status: spontaneous breathing Cardiovascular status: stable Anesthetic complications: no       Last Vitals:  Vitals:   07/09/16 0930 07/09/16 0941  BP: (!) 133/91 135/87  Pulse: 71 68  Resp: 16 18  Temp: 36.2 C     Last Pain:  Vitals:   07/09/16 0941  TempSrc:   PainSc: 0-No pain                 Luccia Reinheimer

## 2016-07-16 ENCOUNTER — Telehealth (INDEPENDENT_AMBULATORY_CARE_PROVIDER_SITE_OTHER): Payer: Self-pay | Admitting: Radiology

## 2016-07-16 NOTE — Telephone Encounter (Signed)
Patient was calling having spot drainage from surgical bandage. She had Right GT amputation 07/09/16. Advised to minimize weightbearing, advised weightbearing will increase possibility of bleeding, incision dehiscence. He is going to come in on Friday when his wife is available to bring him, he is unable to drive,  for dressing change.

## 2016-07-18 ENCOUNTER — Encounter (INDEPENDENT_AMBULATORY_CARE_PROVIDER_SITE_OTHER): Payer: Self-pay | Admitting: Orthopedic Surgery

## 2016-07-18 ENCOUNTER — Ambulatory Visit (INDEPENDENT_AMBULATORY_CARE_PROVIDER_SITE_OTHER): Payer: Medicare Other | Admitting: Orthopedic Surgery

## 2016-07-18 DIAGNOSIS — Z89411 Acquired absence of right great toe: Secondary | ICD-10-CM

## 2016-07-18 DIAGNOSIS — S98111A Complete traumatic amputation of right great toe, initial encounter: Secondary | ICD-10-CM

## 2016-07-18 NOTE — Progress Notes (Signed)
Office Visit Note   Patient: David [name]           Date of Birth: Aug 01, 1945           MRN: [id number] Visit Date: 07/18/2016              Requested by: Eloise Levels, NP [phone], [place] [address] PCP: Eloise Levels, NP  Chief Complaint  Patient presents with  . Right Foot - Routine Post Op    Right Great Toe Amputation at Metatarsophalangeal Joint  9 days post op    HPI: Patient is a 71 y.o male who presents today for follow up right great toe amputation. He is 9 days post op. He had called earlier this week complaining of increased bleeding. Surgical dressing was removed. This is slight skin maceration. There is no active drainage. He is full weightbearing with post operative shoe. He is requesting a new shoe today. He is also requesting a nail trim. Maxcine Ham, RT    Assessment & Plan: Visit Diagnoses:  1. Amputated great toe, right ([place])     Plan: Follow-up in one week to reevaluate right foot start soap and water dressing changes and keep his weight off the foot. The foot elevated.  Follow-Up Instructions: Return in about 1 week (around 07/25/2016).   Ortho Exam Examination incision is well approximated is no cellulitis no drainage. ROS: Complete review of systems negative as otherwise noted in the history of present illness Imaging: No results found.  Labs: Lab Results  Component Value Date   HGBA1C 7.7 (H) 08/14/2013   ESRSEDRATE 73 (H) 02/07/2015   ESRSEDRATE 22 (H) 09/15/2013   CRP 8.2 (H) 02/07/2015   REPTSTATUS 02/08/2015 FINAL 02/07/2015   CULT NO GROWTH 1 DAY 02/07/2015    Orders:  No orders of the defined types were placed in this encounter.  No orders of the defined types were placed in this encounter.    Procedures: No procedures performed  Clinical Data: No additional findings.  Subjective: Review of Systems  Objective: Vital Signs: There were no vitals taken for this visit.  Specialty Comments:  No  specialty comments available.  PMFS History: Patient Active Problem List   Diagnosis Date Noted  . Amputated great toe, right ([place]) 07/18/2016  . Subacute osteomyelitis, right ankle and foot ([place]) 05/29/2016  . Onychomycosis 05/29/2016  . Diabetic polyneuropathy associated with type 2 diabetes mellitus ([place]) 04/09/2016  . Right foot ulcer, limited to breakdown of skin ([place]) 04/09/2016  . Sepsis due to cellulitis ([place]) 02/07/2015  . CKD (chronic kidney disease) 02/07/2015  . Sepsis ([place]) 02/06/2015  . Unspecified hereditary and idiopathic peripheral neuropathy 09/15/2013  . Hypertension, uncontrolled 09/14/2013  . Diabetes mellitus ([place]) 09/14/2013  . Malaise 08/14/2013  . Intractable nausea and vomiting 08/14/2013  . Acute gastroenteritis 08/14/2013  . Hypertension 08/14/2013  . Chronic back pain 08/14/2013  . Glaucoma 08/14/2013  . Headache 08/14/2013  . Hypertensive urgency 08/14/2013   Past Medical History:  Diagnosis Date  . Arthritis   . Diabetes mellitus without complication ([place])    type II  . Diabetic neuropathy ([place])   . GERD (gastroesophageal reflux disease)   . Glaucoma   . Hypertension     Family History  Problem Relation Age of Onset  . Diabetes Other   . Hyperlipidemia Other   . Hypertension Other   . Stroke Other   . Alzheimer's disease Other     Past Surgical History:  Procedure Laterality Date  . AMPUTATION Right 07/09/2016   Procedure: Right Great Toe Amputation at Metatarsophalangeal Joint;  Surgeon: [name], MD;  Location: [facility];  Service: Orthopedics;  Laterality: Right;  . BACK SURGERY     4  . CYST EXCISION     on Back  . JOINT REPLACEMENT    . TOTAL HIP ARTHROPLASTY     Right    Social History   Occupational History  . Not on file.   Social History Main Topics  . Smoking status: Never Smoker  . Smokeless tobacco: Never Used  . Alcohol use No  . Drug use: No  . Sexual activity: Yes

## 2016-07-21 ENCOUNTER — Inpatient Hospital Stay (INDEPENDENT_AMBULATORY_CARE_PROVIDER_SITE_OTHER): Payer: Medicare Other | Admitting: Orthopedic Surgery

## 2016-07-22 ENCOUNTER — Inpatient Hospital Stay (INDEPENDENT_AMBULATORY_CARE_PROVIDER_SITE_OTHER): Payer: Medicare Other | Admitting: Orthopedic Surgery

## 2016-07-23 ENCOUNTER — Telehealth (INDEPENDENT_AMBULATORY_CARE_PROVIDER_SITE_OTHER): Payer: Self-pay | Admitting: Orthopedic Surgery

## 2016-07-23 NOTE — Telephone Encounter (Signed)
Patient's wife states that her husband is NON COMPLIANT and she needs guidance.  She doesn't know what to do.  He is not changing the dressing like he should and states he is afraid of "germs".   She thinks he will try to go to work on Thursday and Friday.  He also took the car and is driving today.  He is not elevating the foot. Her concern that he is going to lose the foot by not following directions.

## 2016-07-24 ENCOUNTER — Inpatient Hospital Stay (INDEPENDENT_AMBULATORY_CARE_PROVIDER_SITE_OTHER): Payer: Medicare Other | Admitting: Orthopedic Surgery

## 2016-07-24 ENCOUNTER — Encounter (INDEPENDENT_AMBULATORY_CARE_PROVIDER_SITE_OTHER): Payer: Self-pay

## 2016-07-24 NOTE — Telephone Encounter (Signed)
Patients wife is wanting [place] to reencourage patient about proper wound care, she is fearful he is at loss of limb. She would not like [place] to tell him that she has called. I have tried to call patient again today to ask him how he is doing and hoping to steer him in the right direction. And advising that he cannot leave dressing on for several days. I left him two voicemails already. Can you please try to call patient again tomorrow.

## 2016-07-24 NOTE — Telephone Encounter (Signed)
I left patient a voicemail yesterday asking patient to please call back. Spoke extensively with patients wife about patients noncompliance. He is a 'germophobe' and will not let her change the bandage.

## 2016-07-25 NOTE — Telephone Encounter (Signed)
vm again this afternoon will try again on Monday.

## 2016-07-28 ENCOUNTER — Encounter (INDEPENDENT_AMBULATORY_CARE_PROVIDER_SITE_OTHER): Payer: Self-pay | Admitting: Orthopedic Surgery

## 2016-07-28 ENCOUNTER — Ambulatory Visit (INDEPENDENT_AMBULATORY_CARE_PROVIDER_SITE_OTHER): Payer: Medicare Other | Admitting: Orthopedic Surgery

## 2016-07-28 VITALS — Ht 74.0 in | Wt 226.0 lb

## 2016-07-28 DIAGNOSIS — Z89411 Acquired absence of right great toe: Secondary | ICD-10-CM

## 2016-07-28 DIAGNOSIS — S98111A Complete traumatic amputation of right great toe, initial encounter: Secondary | ICD-10-CM

## 2016-07-28 NOTE — Telephone Encounter (Signed)
Pt has an appt today and will discuss with him at office visit.

## 2016-07-28 NOTE — Progress Notes (Signed)
Office Visit Note   Patient: David [name]           Date of Birth: [birth year]           MRN: [id number] Visit Date: 07/28/2016              Requested by: Eloise Levels, NP [phone], [place] [address] PCP: Eloise Levels, NP  Chief Complaint  Patient presents with  . Right Foot - Routine Post Op    Right great toe amputation 07/09/16    HPI: Pt is s/p a left great toe amputation. He has not changed the dressing since his last visit nor washed the area. The pt's wife called last week to advise that the pt is scared of germs and he thinks that daily wound care will open him up to infection. There is a small amount of greens drain on his dressing. The pt is weight bearing with a cane and a post op shoe. The stitches are intact and the pt does not have any complaints of pain. Pamella Pert, RMA    Assessment & Plan: Visit Diagnoses:  1. Amputated great toe, right ([place])     Plan: Cleanse incision daily. Apply dry dressing. Follow up in 1 more week for suture removal.  Follow-Up Instructions: Return in about 1 week (around 08/04/2016) for suture removal.   Ortho Exam Incision is well approximated. Healing well. Sutures remain in place. No drainage. No erythema. No odor.   Imaging: No results found.  Labs: Lab Results  Component Value Date   HGBA1C 7.7 (H) 08/14/2013   ESRSEDRATE 73 (H) 02/07/2015   ESRSEDRATE 22 (H) 09/15/2013   CRP 8.2 (H) 02/07/2015   REPTSTATUS 02/08/2015 FINAL 02/07/2015   CULT NO GROWTH 1 DAY 02/07/2015    Orders:  No orders of the defined types were placed in this encounter.  No orders of the defined types were placed in this encounter.    Procedures: No procedures performed  Clinical Data: No additional findings.  Subjective: Review of Systems  Constitutional: Negative for chills and fever.    Objective: Vital Signs: Ht 6\' 2"  (1.88 m)   Wt 226 lb (102.5 kg)   BMI 29.02 kg/m   Specialty Comments:  No  specialty comments available.  PMFS History: Patient Active Problem List   Diagnosis Date Noted  . Amputated great toe, right ([place]) 07/18/2016  . Onychomycosis 05/29/2016  . Diabetic polyneuropathy associated with type 2 diabetes mellitus ([place]) 04/09/2016  . Sepsis due to cellulitis ([place]) 02/07/2015  . CKD (chronic kidney disease) 02/07/2015  . Sepsis ([place]) 02/06/2015  . Unspecified hereditary and idiopathic peripheral neuropathy 09/15/2013  . Hypertension, uncontrolled 09/14/2013  . Diabetes mellitus ([place]) 09/14/2013  . Malaise 08/14/2013  . Intractable nausea and vomiting 08/14/2013  . Acute gastroenteritis 08/14/2013  . Hypertension 08/14/2013  . Chronic back pain 08/14/2013  . Glaucoma 08/14/2013  . Headache 08/14/2013  . Hypertensive urgency 08/14/2013   Past Medical History:  Diagnosis Date  . Arthritis   . Diabetes mellitus without complication ([place])    type II  . Diabetic neuropathy ([place])   . GERD (gastroesophageal reflux disease)   . Glaucoma   . Hypertension     Family History  Problem Relation Age of Onset  . Diabetes Other   . Hyperlipidemia Other   . Hypertension Other   . Stroke Other   . Alzheimer's disease Other     Past Surgical History:  Procedure Laterality Date  .  AMPUTATION Right 07/09/2016   Procedure: Right Great Toe Amputation at Metatarsophalangeal Joint;  Surgeon: [name], MD;  Location: [facility];  Service: Orthopedics;  Laterality: Right;  . BACK SURGERY     4  . CYST EXCISION     on Back  . JOINT REPLACEMENT    . TOTAL HIP ARTHROPLASTY     Right    Social History   Occupational History  . Not on file.   Social History Main Topics  . Smoking status: Never Smoker  . Smokeless tobacco: Never Used  . Alcohol use No  . Drug use: No  . Sexual activity: Yes

## 2016-08-04 ENCOUNTER — Ambulatory Visit (INDEPENDENT_AMBULATORY_CARE_PROVIDER_SITE_OTHER): Payer: Medicare Other | Admitting: Family

## 2016-08-04 DIAGNOSIS — Z89411 Acquired absence of right great toe: Secondary | ICD-10-CM | POA: Diagnosis not present

## 2016-08-04 DIAGNOSIS — B351 Tinea unguium: Secondary | ICD-10-CM

## 2016-08-04 DIAGNOSIS — E1142 Type 2 diabetes mellitus with diabetic polyneuropathy: Secondary | ICD-10-CM

## 2016-08-04 DIAGNOSIS — S98111A Complete traumatic amputation of right great toe, initial encounter: Secondary | ICD-10-CM

## 2016-08-04 NOTE — Progress Notes (Signed)
Office Visit Note   Patient: David [name]           Date of Birth: 1946-03-26           MRN: [id number] Visit Date: 08/04/2016              Requested by: Eloise Levels, NP [phone], [place] [address] PCP: Eloise Levels, NP  No chief complaint on file.   HPI: The patient is a 71 year old gentleman who presents today 3 weeks status post right great toe amputation. This has been healing well. Has been doing dry dressings. Is in regular shoewear with diabetic socks today. Complains of him thickened and discolored toenails that he is unable to safely trim on his own bilaterally.    Assessment & Plan: Visit Diagnoses:  1. Amputated great toe, right ([place])   2. Onychomycosis   3. Diabetic polyneuropathy associated with type 2 diabetes mellitus ([place])     Plan: Sutures harvested today. Nails trimmed x 9. Will continue with daily wound care to right great toe amputation site. Dry dressings. Weight bearing as tolerated.   Follow-Up Instructions: Return in about 2 weeks (around 08/18/2016).   Ortho Exam Physical Exam  Constitutional: Appears well-developed.  Head: Normocephalic.  Eyes: EOM are normal.  Neck: Normal range of motion.  Cardiovascular: Normal rate.   Pulmonary/Chest: Effort normal.  Neurological: Is alert.  Skin: Skin is warm.  Psychiatric: Has a normal mood and affect. Right great toe is surgically absent incision is well approxmated and healing well. Sutures harvested today. Over lateral aspect of incision has some exudative tissue. There is no depth. No drainage no odor.  Does have thickened and discolored onychomycotic nails x 9. Is unable to safely trim his own nails. These were trimmed today without incident.  Imaging: No results found.  Labs: Lab Results  Component Value Date   HGBA1C 7.7 (H) 08/14/2013   ESRSEDRATE 73 (H) 02/07/2015   ESRSEDRATE 22 (H) 09/15/2013   CRP 8.2 (H) 02/07/2015   REPTSTATUS 02/08/2015 FINAL 02/07/2015   CULT  NO GROWTH 1 DAY 02/07/2015    Orders:  No orders of the defined types were placed in this encounter.  No orders of the defined types were placed in this encounter.    Procedures: No procedures performed  Clinical Data: No additional findings.  Subjective: Review of Systems  Constitutional: Negative for chills and fever.  Cardiovascular: Negative for leg swelling.    Objective: Vital Signs: There were no vitals taken for this visit.  Specialty Comments:  No specialty comments available.  PMFS History: Patient Active Problem List   Diagnosis Date Noted  . Amputated great toe, right ([place]) 07/18/2016  . Onychomycosis 05/29/2016  . Diabetic polyneuropathy associated with type 2 diabetes mellitus ([place]) 04/09/2016  . Sepsis due to cellulitis ([place]) 02/07/2015  . CKD (chronic kidney disease) 02/07/2015  . Sepsis ([place]) 02/06/2015  . Unspecified hereditary and idiopathic peripheral neuropathy 09/15/2013  . Hypertension, uncontrolled 09/14/2013  . Diabetes mellitus ([place]) 09/14/2013  . Malaise 08/14/2013  . Intractable nausea and vomiting 08/14/2013  . Acute gastroenteritis 08/14/2013  . Hypertension 08/14/2013  . Chronic back pain 08/14/2013  . Glaucoma 08/14/2013  . Headache 08/14/2013  . Hypertensive urgency 08/14/2013   Past Medical History:  Diagnosis Date  . Arthritis   . Diabetes mellitus without complication ([place])    type II  . Diabetic neuropathy ([place])   . GERD (gastroesophageal reflux disease)   . Glaucoma   .  Hypertension     Family History  Problem Relation Age of Onset  . Diabetes Other   . Hyperlipidemia Other   . Hypertension Other   . Stroke Other   . Alzheimer's disease Other     Past Surgical History:  Procedure Laterality Date  . AMPUTATION Right 07/09/2016   Procedure: Right Great Toe Amputation at Metatarsophalangeal Joint;  Surgeon: [name], MD;  Location: [facility];  Service: Orthopedics;  Laterality: Right;  . BACK SURGERY     4  .  CYST EXCISION     on Back  . JOINT REPLACEMENT    . TOTAL HIP ARTHROPLASTY     Right    Social History   Occupational History  . Not on file.   Social History Main Topics  . Smoking status: Never Smoker  . Smokeless tobacco: Never Used  . Alcohol use No  . Drug use: No  . Sexual activity: Yes

## 2016-08-19 ENCOUNTER — Ambulatory Visit (INDEPENDENT_AMBULATORY_CARE_PROVIDER_SITE_OTHER): Payer: Medicare Other | Admitting: Orthopedic Surgery

## 2016-08-19 ENCOUNTER — Encounter (INDEPENDENT_AMBULATORY_CARE_PROVIDER_SITE_OTHER): Payer: Self-pay | Admitting: Orthopedic Surgery

## 2016-08-19 DIAGNOSIS — S98111A Complete traumatic amputation of right great toe, initial encounter: Secondary | ICD-10-CM

## 2016-08-19 DIAGNOSIS — Z89411 Acquired absence of right great toe: Secondary | ICD-10-CM

## 2016-08-19 DIAGNOSIS — M6701 Short Achilles tendon (acquired), right ankle: Secondary | ICD-10-CM

## 2016-08-19 NOTE — Progress Notes (Signed)
Office Visit Note   Patient: David [name]           Date of Birth: 1945/06/25           MRN: [id number] Visit Date: 08/19/2016              Requested by: Eloise Levels, NP [phone], [place] [address] PCP: Eloise Levels, NP  Chief Complaint  Patient presents with  . Right Foot - Follow-up    Right GT Amputation 07/09/16 ~ 6 wks post op      HPI: Patient is status post a right great toe amputation about 6 weeks out of note is developed an abrasion on the left great toe patient has been wearing slip on shoes walked a long distance and was gripping with his toes to hold his shoes on. Patient also has venous stasis changes and has been wearing a pair of thin compression socks.  Assessment & Plan: Visit Diagnoses:  1. Amputated great toe, right ([place])   2. Acquired contracture of Achilles tendon, right     Plan: Recommended a skin new balance walking sneakers with a wide toebox recommended the medical compression stockings with Terry cloth padding. Continue the Band-Aid for the right great toe amputation.  Follow-Up Instructions: Return in about 4 weeks (around 09/16/2016).   Ortho Exam  Patient is alert, oriented, no adenopathy, well-dressed, normal affect, normal respiratory effort. Examination the incision on the right foot is almost completely healed there is a very small open wound about 3 mm there is no redness no cellulitis no drainage no signs of infection. Examination patient developed an abrasion over the tip of the left great toe he has good pulses bilaterally he has a callus beneath the fifth metatarsal head of the right foot secondary to heel cord contracture. No cellulitis no open wound in the left foot.  Imaging: No results found.  Labs: Lab Results  Component Value Date   HGBA1C 7.7 (H) 08/14/2013   ESRSEDRATE 73 (H) 02/07/2015   ESRSEDRATE 22 (H) 09/15/2013   CRP 8.2 (H) 02/07/2015   REPTSTATUS 02/08/2015 FINAL 02/07/2015   CULT NO GROWTH 1  DAY 02/07/2015    Orders:  No orders of the defined types were placed in this encounter.  No orders of the defined types were placed in this encounter.    Procedures: No procedures performed  Clinical Data: No additional findings.  ROS:  All other systems negative, except as noted in the HPI. Review of Systems  Objective: Vital Signs: There were no vitals taken for this visit.  Specialty Comments:  No specialty comments available.  PMFS History: Patient Active Problem List   Diagnosis Date Noted  . Acquired contracture of Achilles tendon, right 08/19/2016  . Amputated great toe, right ([place]) 07/18/2016  . Onychomycosis 05/29/2016  . Diabetic polyneuropathy associated with type 2 diabetes mellitus ([place]) 04/09/2016  . Sepsis due to cellulitis ([place]) 02/07/2015  . CKD (chronic kidney disease) 02/07/2015  . Sepsis ([place]) 02/06/2015  . Unspecified hereditary and idiopathic peripheral neuropathy 09/15/2013  . Hypertension, uncontrolled 09/14/2013  . Diabetes mellitus ([place]) 09/14/2013  . Malaise 08/14/2013  . Intractable nausea and vomiting 08/14/2013  . Acute gastroenteritis 08/14/2013  . Hypertension 08/14/2013  . Chronic back pain 08/14/2013  . Glaucoma 08/14/2013  . Headache 08/14/2013  . Hypertensive urgency 08/14/2013   Past Medical History:  Diagnosis Date  . Arthritis   . Diabetes mellitus without complication ([place])    type II  .  Diabetic neuropathy ([place])   . GERD (gastroesophageal reflux disease)   . Glaucoma   . Hypertension     Family History  Problem Relation Age of Onset  . Diabetes Other   . Hyperlipidemia Other   . Hypertension Other   . Stroke Other   . Alzheimer's disease Other     Past Surgical History:  Procedure Laterality Date  . AMPUTATION Right 07/09/2016   Procedure: Right Great Toe Amputation at Metatarsophalangeal Joint;  Surgeon: [name], MD;  Location: [facility];  Service: Orthopedics;  Laterality: Right;  . BACK SURGERY     4    . CYST EXCISION     on Back  . JOINT REPLACEMENT    . TOTAL HIP ARTHROPLASTY     Right    Social History   Occupational History  . Not on file.   Social History Main Topics  . Smoking status: Never Smoker  . Smokeless tobacco: Never Used  . Alcohol use No  . Drug use: No  . Sexual activity: Yes

## 2016-09-03 ENCOUNTER — Encounter (HOSPITAL_COMMUNITY): Payer: Self-pay | Admitting: *Deleted

## 2016-09-03 ENCOUNTER — Inpatient Hospital Stay (HOSPITAL_COMMUNITY)
Admission: EM | Admit: 2016-09-03 | Discharge: 2016-09-08 | DRG: 291 | Disposition: A | Discharge status: Home / Self Care | Payer: Medicare Other | Attending: Internal Medicine | Admitting: Internal Medicine

## 2016-09-03 ENCOUNTER — Emergency Department (HOSPITAL_COMMUNITY): Payer: Medicare Other

## 2016-09-03 DIAGNOSIS — R5383 Other fatigue: Secondary | ICD-10-CM

## 2016-09-03 DIAGNOSIS — I440 Atrioventricular block, first degree: Secondary | ICD-10-CM | POA: Diagnosis present

## 2016-09-03 DIAGNOSIS — M5030 Other cervical disc degeneration, unspecified cervical region: Secondary | ICD-10-CM | POA: Diagnosis present

## 2016-09-03 DIAGNOSIS — Z8249 Family history of ischemic heart disease and other diseases of the circulatory system: Secondary | ICD-10-CM

## 2016-09-03 DIAGNOSIS — N179 Acute kidney failure, unspecified: Secondary | ICD-10-CM | POA: Diagnosis present

## 2016-09-03 DIAGNOSIS — E1142 Type 2 diabetes mellitus with diabetic polyneuropathy: Secondary | ICD-10-CM | POA: Diagnosis present

## 2016-09-03 DIAGNOSIS — Z96641 Presence of right artificial hip joint: Secondary | ICD-10-CM | POA: Diagnosis present

## 2016-09-03 DIAGNOSIS — D539 Nutritional anemia, unspecified: Secondary | ICD-10-CM | POA: Diagnosis present

## 2016-09-03 DIAGNOSIS — Z9111 Patient's noncompliance with dietary regimen: Secondary | ICD-10-CM

## 2016-09-03 DIAGNOSIS — E1121 Type 2 diabetes mellitus with diabetic nephropathy: Secondary | ICD-10-CM | POA: Diagnosis present

## 2016-09-03 DIAGNOSIS — I130 Hypertensive heart and chronic kidney disease with heart failure and stage 1 through stage 4 chronic kidney disease, or unspecified chronic kidney disease: Secondary | ICD-10-CM | POA: Diagnosis present

## 2016-09-03 DIAGNOSIS — Z794 Long term (current) use of insulin: Secondary | ICD-10-CM | POA: Diagnosis not present

## 2016-09-03 DIAGNOSIS — I5033 Acute on chronic diastolic (congestive) heart failure: Secondary | ICD-10-CM | POA: Diagnosis not present

## 2016-09-03 DIAGNOSIS — F419 Anxiety disorder, unspecified: Secondary | ICD-10-CM | POA: Diagnosis present

## 2016-09-03 DIAGNOSIS — K219 Gastro-esophageal reflux disease without esophagitis: Secondary | ICD-10-CM | POA: Diagnosis present

## 2016-09-03 DIAGNOSIS — E11649 Type 2 diabetes mellitus with hypoglycemia without coma: Secondary | ICD-10-CM | POA: Diagnosis present

## 2016-09-03 DIAGNOSIS — E1122 Type 2 diabetes mellitus with diabetic chronic kidney disease: Secondary | ICD-10-CM | POA: Diagnosis present

## 2016-09-03 DIAGNOSIS — G4730 Sleep apnea, unspecified: Secondary | ICD-10-CM | POA: Diagnosis present

## 2016-09-03 DIAGNOSIS — R0989 Other specified symptoms and signs involving the circulatory and respiratory systems: Secondary | ICD-10-CM

## 2016-09-03 DIAGNOSIS — R0600 Dyspnea, unspecified: Secondary | ICD-10-CM | POA: Diagnosis present

## 2016-09-03 DIAGNOSIS — R5381 Other malaise: Secondary | ICD-10-CM | POA: Diagnosis not present

## 2016-09-03 DIAGNOSIS — H409 Unspecified glaucoma: Secondary | ICD-10-CM | POA: Diagnosis present

## 2016-09-03 DIAGNOSIS — I2722 Pulmonary hypertension due to left heart disease: Secondary | ICD-10-CM | POA: Diagnosis present

## 2016-09-03 DIAGNOSIS — I272 Pulmonary hypertension, unspecified: Secondary | ICD-10-CM | POA: Diagnosis present

## 2016-09-03 DIAGNOSIS — Z89411 Acquired absence of right great toe: Secondary | ICD-10-CM

## 2016-09-03 DIAGNOSIS — Z7982 Long term (current) use of aspirin: Secondary | ICD-10-CM

## 2016-09-03 DIAGNOSIS — E1165 Type 2 diabetes mellitus with hyperglycemia: Secondary | ICD-10-CM | POA: Diagnosis present

## 2016-09-03 DIAGNOSIS — R0609 Other forms of dyspnea: Secondary | ICD-10-CM | POA: Diagnosis not present

## 2016-09-03 DIAGNOSIS — I160 Hypertensive urgency: Secondary | ICD-10-CM | POA: Diagnosis present

## 2016-09-03 DIAGNOSIS — I5021 Acute systolic (congestive) heart failure: Secondary | ICD-10-CM | POA: Diagnosis not present

## 2016-09-03 DIAGNOSIS — N183 Chronic kidney disease, stage 3 (moderate): Secondary | ICD-10-CM

## 2016-09-03 DIAGNOSIS — R0602 Shortness of breath: Secondary | ICD-10-CM

## 2016-09-03 DIAGNOSIS — E119 Type 2 diabetes mellitus without complications: Secondary | ICD-10-CM

## 2016-09-03 DIAGNOSIS — R059 Cough, unspecified: Secondary | ICD-10-CM

## 2016-09-03 DIAGNOSIS — R05 Cough: Secondary | ICD-10-CM | POA: Diagnosis present

## 2016-09-03 DIAGNOSIS — I5031 Acute diastolic (congestive) heart failure: Secondary | ICD-10-CM | POA: Diagnosis not present

## 2016-09-03 DIAGNOSIS — G894 Chronic pain syndrome: Secondary | ICD-10-CM | POA: Diagnosis present

## 2016-09-03 DIAGNOSIS — I10 Essential (primary) hypertension: Secondary | ICD-10-CM | POA: Diagnosis not present

## 2016-09-03 DIAGNOSIS — R609 Edema, unspecified: Secondary | ICD-10-CM | POA: Diagnosis not present

## 2016-09-03 DIAGNOSIS — E785 Hyperlipidemia, unspecified: Secondary | ICD-10-CM | POA: Diagnosis present

## 2016-09-03 DIAGNOSIS — I5043 Acute on chronic combined systolic (congestive) and diastolic (congestive) heart failure: Secondary | ICD-10-CM | POA: Diagnosis present

## 2016-09-03 DIAGNOSIS — M1990 Unspecified osteoarthritis, unspecified site: Secondary | ICD-10-CM | POA: Diagnosis present

## 2016-09-03 HISTORY — DX: Other cervical disc degeneration, unspecified cervical region: M50.30

## 2016-09-03 LAB — CBC
HEMATOCRIT: 31.7 % — AB (ref 39.0–52.0)
HEMOGLOBIN: 10.3 g/dL — AB (ref 13.0–17.0)
MCH: 33 pg (ref 26.0–34.0)
MCHC: 32.5 g/dL (ref 30.0–36.0)
MCV: 101.6 fL — AB (ref 78.0–100.0)
Platelets: 425 10*3/uL — ABNORMAL HIGH (ref 150–400)
RBC: 3.12 MIL/uL — ABNORMAL LOW (ref 4.22–5.81)
RDW: 12.3 % (ref 11.5–15.5)
WBC: 14.4 10*3/uL — ABNORMAL HIGH (ref 4.0–10.5)

## 2016-09-03 LAB — CBG MONITORING, ED: Glucose-Capillary: 223 mg/dL — ABNORMAL HIGH (ref 65–99)

## 2016-09-03 LAB — BASIC METABOLIC PANEL
ANION GAP: 13 (ref 5–15)
BUN: 18 mg/dL (ref 6–20)
CHLORIDE: 100 mmol/L — AB (ref 101–111)
CO2: 25 mmol/L (ref 22–32)
Calcium: 9.2 mg/dL (ref 8.9–10.3)
Creatinine, Ser: 1.27 mg/dL — ABNORMAL HIGH (ref 0.61–1.24)
GFR calc Af Amer: >60 mL/min (ref >60)
GFR, EST NON AFRICAN AMERICAN: 56 mL/min — AB (ref >60)
GLUCOSE: 227 mg/dL — AB (ref 65–99)
POTASSIUM: 3.8 mmol/L (ref 3.5–5.1)
Sodium: 138 mmol/L (ref 135–145)

## 2016-09-03 LAB — I-STAT TROPONIN, ED
Troponin i, poc: 0.01 ng/mL (ref 0.00–0.08)
Troponin i, poc: 0.02 ng/mL (ref 0.00–0.08)

## 2016-09-03 LAB — BRAIN NATRIURETIC PEPTIDE: B NATRIURETIC PEPTIDE 5: 158.8 pg/mL — AB (ref 0.0–100.0)

## 2016-09-03 LAB — HEPATIC FUNCTION PANEL
ALK PHOS: 191 U/L — AB (ref 38–126)
ALT: 42 U/L (ref 17–63)
AST: 23 U/L (ref 15–41)
Albumin: 3.4 g/dL — ABNORMAL LOW (ref 3.5–5.0)
BILIRUBIN TOTAL: 1 mg/dL (ref 0.3–1.2)
Bilirubin, Direct: <0.1 mg/dL — ABNORMAL LOW (ref 0.1–0.5)
TOTAL PROTEIN: 7.9 g/dL (ref 6.5–8.1)

## 2016-09-03 MED ORDER — FUROSEMIDE 10 MG/ML IJ SOLN
20.0000 mg | Freq: Once | INTRAMUSCULAR | Status: AC
  Administered 2016-09-03: 20 mg via INTRAVENOUS
  Filled 2016-09-03: qty 2

## 2016-09-03 MED ORDER — LABETALOL HCL 5 MG/ML IV SOLN
5.0000 mg | Freq: Once | INTRAVENOUS | Status: AC
  Administered 2016-09-03: 5 mg via INTRAVENOUS
  Filled 2016-09-03: qty 4

## 2016-09-03 MED ORDER — HYDROCODONE-ACETAMINOPHEN 5-325 MG PO TABS
2.0000 | ORAL_TABLET | Freq: Once | ORAL | Status: AC
  Administered 2016-09-03: 2 via ORAL
  Filled 2016-09-03: qty 2

## 2016-09-03 NOTE — H&P (Signed)
History and Physical    JABRIEL VANDUYNE [name]:[id number] DOB: [birth year] DOA: 09/03/2016  PCP: Eloise Levels, NP  Patient coming from: Home  Chief Complaint: SOB  HPI: David [name] is a 71 y.o. male with medical history significant of HTN, glaucoma, GERD, DM, Arthritis, anxiety who presents for dyspnea.  Mr. Dupree reports that over the last 3 weeks, he has had increasing fatigue, swelling and SOB.  The SOB is particularly bad when walking.  He will get dyspneic after walking only 5 feet when he formerly was able to walk miles without resting. Further symptoms include orthopnea, and PND.  He also has wheezing and swelling in the ankles.  He has had an intermittent cough during this time and today he had post-tussive emesis which prompted him to get evaluated.  He has had chest congestion and has intermittently coughed up phlegm, but has had no chest pain or hemoptysis.  He has had no recent viral illness or fevers.  He does report some dull pressure at the sternum which comes and goes.  Further symptoms are pain in the lower left back and increased urination every half hour to hour.  He has a history of HTN and reports adherence to his medications.    ED Course: In the ED, he was noted to have a very high blood pressure in the 485I-627O systolic and a very mild tachycardia which improved over the course of his stay.  He had a Cr of 1.27 which is stable and elevated.  LFTs were within normal limits.  TnI were 0.02 --> 0.03 --> 0.05.  WBC was 14.4 with a Hgb of 10.3.    Review of Systems: As per HPI otherwise 10 point review of systems negative.   Past Medical History:  Diagnosis Date  . Arthritis   . DDD (degenerative disc disease), cervical   . Diabetes mellitus without complication (David [name])    type II  . Diabetic neuropathy (David [name])   . GERD (gastroesophageal reflux disease)   . Glaucoma   . Hypertension     Past Surgical History:  Procedure Laterality Date  . AMPUTATION Right 07/09/2016     Procedure: Right Great Toe Amputation at Metatarsophalangeal Joint;  Surgeon: [name], MD;  Location: [facility];  Service: Orthopedics;  Laterality: Right;  . BACK SURGERY     4  . CYST EXCISION     on Back  . JOINT REPLACEMENT    . TOTAL HIP ARTHROPLASTY     Right    Reviewed with patient  reports that he has never smoked. He has never used smokeless tobacco. He reports that he drinks alcohol. He reports that he does not use drugs.  Allergies  Allergen Reactions  . No Known Allergies    Reviewed with patient.  Family History  Problem Relation Age of Onset  . Diabetes Other   . Hyperlipidemia Other   . Hypertension Other   . Stroke Other   . Alzheimer's disease Other   . Thyroid disease Mother   . Diabetes Mellitus II Father     Prior to Admission medications   Medication Sig Start Date End Date Taking? Authorizing Provider  ALPRAZolam (XANAX) 1 MG tablet TAKE 1 TABLET BY MOUTH UP TO TWICE DAILY AS NEEDED 06/17/16   Historical Provider, MD  amLODipine (NORVASC) 10 MG tablet Take 1 tablet (10 mg total) by mouth daily. 09/17/13   Simbiso Ranga, MD  aspirin EC 81 MG tablet Take 81 mg by mouth daily.  Historical Provider, MD  atorvastatin (LIPITOR) 40 MG tablet Take 40 mg by mouth at bedtime. 08/01/16   Historical Provider, MD  B-D UF III MINI PEN NEEDLES 31G X 5 MM MISC USE TO INJECT INSULIN AS DIRECTED 05/26/16   Historical Provider, MD  chlorthalidone (HYGROTON) 25 MG tablet Take 25 mg by mouth every morning. 06/12/16   Historical Provider, MD  docusate sodium (COLACE) 100 MG capsule Take 100 mg by mouth daily.    Historical Provider, MD  doxycycline (VIBRA-TABS) 100 MG tablet TAKE 1 TABLET BY MOUTH TWICE A DAY 06/13/16   Suzan Slick, NP  gabapentin (NEURONTIN) 600 MG tablet Take 600 mg by mouth 2 (two) times daily.  01/31/15   Historical Provider, MD  HYDROcodone-acetaminophen (NORCO) 10-325 MG tablet Take 1 tablet by mouth 2 (two) times daily as needed for moderate pain.   03/19/16   Historical Provider, MD  LEVEMIR FLEXTOUCH 100 UNIT/ML Pen Inject 55 Units into the skin daily.  08/04/13   Historical Provider, MD  LUMIGAN 0.01 % SOLN Place 1 drop into both eyes at bedtime. 07/28/16   Historical Provider, MD  pantoprazole (PROTONIX) 40 MG tablet Take 40 mg by mouth daily. 06/17/16   Historical Provider, MD  TRULICITY 1.5 MW/1.0UV SOPN Take 1.5 mg by mouth every Sunday. 06/15/16   Historical Provider, MD    Physical Exam: Vitals:   09/03/16 2230 09/03/16 2331 09/04/16 0035 09/04/16 0036  BP: (!) 183/93 (!) 170/84 (!) 176/79 (!) 173/87  Pulse: 94 94 95   Resp: 13 14 16    Temp:   99.3 F (37.4 C)   TempSrc:   Oral   SpO2: 92% 98% 97%   Weight:   223 lb 11.2 oz (101.5 kg)   Height:   6\' 2"  (1.88 m)     Constitutional: Somewhat SOB with speaking, but comfortable.  Vitals:   09/03/16 2230 09/03/16 2331 09/04/16 0035 09/04/16 0036  BP: (!) 183/93 (!) 170/84 (!) 176/79 (!) 173/87  Pulse: 94 94 95   Resp: 13 14 16    Temp:   99.3 F (37.4 C)   TempSrc:   Oral   SpO2: 92% 98% 97%   Weight:   223 lb 11.2 oz (101.5 kg)   Height:   6\' 2"  (1.88 m)    Eyes: PERRL, lids and conjunctivae normal ENMT: Mucous membranes are moist. Posterior pharynx clear of any exudate or lesions. Neck: normal, supple, no lymphadenopathy, + JVD to jawline Respiratory: Crackles to mid lung, no wheezing. Normal respiratory effort. No accessory muscle use.  Cardiovascular: tachycardic rate and normal rhythm, no murmurs / rubs / gallops. Edema to mid calf, 1+. 2+ pedal pulses. No carotid bruits.  Abdomen: no tenderness, no masses palpated. +BS, mild distention (he reports similar to previous) Musculoskeletal: no clubbing / cyanosis.  Normal muscle tone.  Skin: no rashes, lesions, ulcers.  He has had a great toe amputation on the right foot.  He has no new wounds noted on feet.  Neurologic: CN 2-12 grossly intact. Sensation intact to light touch.  Strength 5/5 in all 4.  Psychiatric: Normal  judgment and insight. Alert and oriented x 3. Normal mood.    Labs on Admission: I have personally reviewed following labs and imaging studies  CBC:  Recent Labs Lab 09/03/16 1802 09/04/16 0119  WBC 14.4* 12.0*  HGB 10.3* 9.4*  HCT 31.7* 29.4*  MCV 101.6* 101.4*  PLT 425* 253   Basic Metabolic Panel:  Recent Labs Lab 09/03/16 1802  09/04/16 0119  NA 138 135  K 3.8 3.6  CL 100* 99*  CO2 25 26  GLUCOSE 227* 328*  BUN 18 18  CREATININE 1.27* 1.27*  CALCIUM 9.2 8.6*   GFR: Estimated Creatinine Clearance: 69.8 mL/min (A) (by C-G formula based on SCr of 1.27 mg/dL (H)). Liver Function Tests:  Recent Labs Lab 09/03/16 2255  AST 23  ALT 42  ALKPHOS 191*  BILITOT 1.0  PROT 7.9  ALBUMIN 3.4*   No results for input(s): LIPASE, AMYLASE in the last 168 hours. No results for input(s): AMMONIA in the last 168 hours. Coagulation Profile: No results for input(s): INR, PROTIME in the last 168 hours. Cardiac Enzymes:  Recent Labs Lab 09/04/16 0119  TROPONINI 0.05*   BNP (last 3 results) No results for input(s): PROBNP in the last 8760 hours. HbA1C: No results for input(s): HGBA1C in the last 72 hours. CBG:  Recent Labs Lab 09/03/16 1801 09/04/16 0103  GLUCAP 223* 288*   Lipid Profile: No results for input(s): CHOL, HDL, LDLCALC, TRIG, CHOLHDL, LDLDIRECT in the last 72 hours. Thyroid Function Tests:  Recent Labs  09/04/16 0119  TSH 1.411   Anemia Panel: No results for input(s): VITAMINB12, FOLATE, FERRITIN, TIBC, IRON, RETICCTPCT in the last 72 hours. Urine analysis:    Component Value Date/Time   COLORURINE YELLOW 02/13/2015 1934   APPEARANCEUR CLEAR 02/13/2015 1934   LABSPEC 1.026 02/13/2015 1934   PHURINE 7.5 02/13/2015 1934   GLUCOSEU 500 (A) 02/13/2015 1934   HGBUR SMALL (A) 02/13/2015 1934   BILIRUBINUR NEGATIVE 02/13/2015 1934   KETONESUR 40 (A) 02/13/2015 1934   PROTEINUR >300 (A) 02/13/2015 1934   UROBILINOGEN 1.0 02/13/2015 1934    NITRITE NEGATIVE 02/13/2015 1934   LEUKOCYTESUR NEGATIVE 02/13/2015 1934    Radiological Exams on Admission: Dg Chest 2 View  Result Date: 09/03/2016 CLINICAL DATA:  Shortness of breath cough and weakness today. EXAM: CHEST  2 VIEW COMPARISON:  09/14/2013 and prior chest radiographs FINDINGS: Cardiomegaly with mild interstitial pulmonary edema noted. Bilateral lower lung opacities probably represent edema/ atelectasis but superimposed infection is not entirely excluded. Small bilateral pleural effusions are noted. There is no evidence of pneumothorax. IMPRESSION: Cardiomegaly with mild interstitial pulmonary edema and small bilateral pleural effusions. Bilateral lower lung opacities likely representing edema/ atelectasis but superimposed infection is not entirely excluded. Correlate clinically. Electronically Signed   By: Margarette Canada M.D.   On: 09/03/2016 18:44    EKG: Independently reviewed. Sinus tachycardia with PVCs  Assessment/Plan Malaise, DOE - DDx includes acute heart failure related to HTN, myocarditis given elevated WBC or pneumonia - CXR seems to indicate volume overload, but pneumonia cannot be excluded.  Time course seems more likely to be heart failure (3 weeks) - BNP mildly elevated - TnI trending upward to 0.05 overnight - EKG with sinus tachycardia, repeat pending - TTE ordered  - would be initial work up for acute HF or myocarditis - Telemetry - Consider cardiology consult based on results - IV lasix X 1 given in ED - He is lasix naive, started on PO lasix 40mg  - Strict I/O, daily weight  Hypertensive urgency - Home amlodipine, chlorthalidone continued - Add PRN hydralazine to get BP < 160    Diabetes mellitus, uncontrolled - Check A1C - Continue levemir and trulicity - SSI  CKD - Stable, possibly another sign of uncontrolled HTN and DM - Trend  Anxiety Continue xanax per home dosing.   DVT prophylaxis: Lovenox Code Status: Full  Disposition Plan:  d/c in  2-3 days Consults called: None, likely will need cardiology Admission status: Telemetry, inpatient   Gilles Chiquito MD [facility] Pager [phone]  If 7PM-7AM, please contact night-coverage [email or web address] Password [facility]  09/04/2016, 6:16 AM

## 2016-09-03 NOTE — ED Provider Notes (Signed)
[place] DEPT Provider Note   CSN: [id number] Arrival date & time: 09/03/16  1739     History   Chief Complaint Chief Complaint  Patient presents with  . Shortness of Breath    HPI David [name] is a 71 y.o. male.  The history is provided by the patient and the spouse.  Shortness of Breath  This is a new problem. The average episode lasts 3 weeks. The problem occurs continuously.The current episode started more than 1 week ago. The problem has been gradually worsening. Associated symptoms include cough, wheezing, PND, vomiting (Pt had one bout of emesis today) and leg swelling. Pertinent negatives include no fever, no headaches, no coryza, no rhinorrhea, no sore throat, no swollen glands, no ear pain, no neck pain, no sputum production, no hemoptysis, no chest pain, no syncope, no abdominal pain, no rash, no leg pain and no claudication. It is unknown what precipitated the problem. He has tried nothing for the symptoms. The treatment provided no relief. He has had no prior hospitalizations. He has had no prior ED visits. He has had no prior ICU admissions. Associated medical issues do not include COPD, CAD, heart failure or past MI.    Past Medical History:  Diagnosis Date  . Arthritis   . DDD (degenerative disc disease), cervical   . Diabetes mellitus without complication ([place])    type II  . Diabetic neuropathy ([place])   . GERD (gastroesophageal reflux disease)   . Glaucoma   . Hypertension     Patient Active Problem List   Diagnosis Date Noted  . Acquired contracture of Achilles tendon, right 08/19/2016  . Amputated great toe, right ([place]) 07/18/2016  . Onychomycosis 05/29/2016  . Diabetic polyneuropathy associated with type 2 diabetes mellitus ([place]) 04/09/2016  . Sepsis due to cellulitis ([place]) 02/07/2015  . CKD (chronic kidney disease) 02/07/2015  . Sepsis ([place]) 02/06/2015  . Unspecified hereditary and idiopathic peripheral neuropathy 09/15/2013  . Hypertension,  uncontrolled 09/14/2013  . Diabetes mellitus ([place]) 09/14/2013  . Malaise 08/14/2013  . Intractable nausea and vomiting 08/14/2013  . Acute gastroenteritis 08/14/2013  . Hypertension 08/14/2013  . Chronic back pain 08/14/2013  . Glaucoma 08/14/2013  . Headache 08/14/2013  . Hypertensive urgency 08/14/2013    Past Surgical History:  Procedure Laterality Date  . AMPUTATION Right 07/09/2016   Procedure: Right Great Toe Amputation at Metatarsophalangeal Joint;  Surgeon: [name], MD;  Location: [facility];  Service: Orthopedics;  Laterality: Right;  . BACK SURGERY     4  . CYST EXCISION     on Back  . JOINT REPLACEMENT    . TOTAL HIP ARTHROPLASTY     Right        Home Medications    Prior to Admission medications   Medication Sig Start Date End Date Taking? Authorizing Provider  ALPRAZolam (XANAX) 1 MG tablet TAKE 1 TABLET BY MOUTH UP TO TWICE DAILY AS NEEDED 06/17/16   Historical Provider, MD  amLODipine (NORVASC) 10 MG tablet Take 1 tablet (10 mg total) by mouth daily. 09/17/13   Simbiso Ranga, MD  aspirin EC 81 MG tablet Take 81 mg by mouth daily.    Historical Provider, MD  atorvastatin (LIPITOR) 40 MG tablet Take 40 mg by mouth at bedtime. 08/01/16   Historical Provider, MD  B-D UF III MINI PEN NEEDLES 31G X 5 MM MISC USE TO INJECT INSULIN AS DIRECTED 05/26/16   Historical Provider, MD  chlorthalidone (HYGROTON) 25 MG tablet Take 25  mg by mouth every morning. 06/12/16   Historical Provider, MD  docusate sodium (COLACE) 100 MG capsule Take 100 mg by mouth daily.    Historical Provider, MD  doxycycline (VIBRA-TABS) 100 MG tablet TAKE 1 TABLET BY MOUTH TWICE A DAY 06/13/16   Suzan Slick, NP  gabapentin (NEURONTIN) 600 MG tablet Take 600 mg by mouth 2 (two) times daily.  01/31/15   Historical Provider, MD  HYDROcodone-acetaminophen (NORCO) 10-325 MG tablet Take 1 tablet by mouth 2 (two) times daily as needed for moderate pain.  03/19/16   Historical Provider, MD  LEVEMIR FLEXTOUCH 100  UNIT/ML Pen Inject 55 Units into the skin daily.  08/04/13   Historical Provider, MD  LUMIGAN 0.01 % SOLN Place 1 drop into both eyes at bedtime. 07/28/16   Historical Provider, MD  pantoprazole (PROTONIX) 40 MG tablet Take 40 mg by mouth daily. 06/17/16   Historical Provider, MD  TRULICITY 1.5 UM/3.5TI SOPN Take 1.5 mg by mouth every Sunday. 06/15/16   Historical Provider, MD    Family History Family History  Problem Relation Age of Onset  . Diabetes Other   . Hyperlipidemia Other   . Hypertension Other   . Stroke Other   . Alzheimer's disease Other     Social History Social History  Substance Use Topics  . Smoking status: Never Smoker  . Smokeless tobacco: Never Used  . Alcohol use Yes     Comment: occ     Allergies   No known allergies   Review of Systems Review of Systems  Constitutional: Positive for fatigue. Negative for chills and fever.  HENT: Negative for ear pain, rhinorrhea and sore throat.   Eyes: Negative for pain and visual disturbance.  Respiratory: Positive for cough, shortness of breath and wheezing. Negative for hemoptysis and sputum production.   Cardiovascular: Positive for leg swelling and PND. Negative for chest pain, palpitations, claudication and syncope.  Gastrointestinal: Positive for vomiting (Pt had one bout of emesis today). Negative for abdominal pain.  Genitourinary: Negative for dysuria and hematuria.  Musculoskeletal: Negative for arthralgias, back pain and neck pain.  Skin: Negative for color change and rash.  Neurological: Negative for seizures, syncope and headaches.  All other systems reviewed and are negative.    Physical Exam Updated Vital Signs BP (!) 197/100   Pulse (!) 103   Temp 98.5 F (36.9 C) (Oral)   Resp 12   Ht 6\' 2"  (1.88 m)   Wt 106.6 kg   SpO2 96%   BMI 30.17 kg/m   Physical Exam  Constitutional: He appears well-developed and well-nourished.  HENT:  Head: Normocephalic and atraumatic.  Eyes: Conjunctivae  and EOM are normal. Pupils are equal, round, and reactive to light.  Neck: Neck supple. JVD present.  Cardiovascular: Normal rate and regular rhythm.   Occasional extrasystoles are present.  No murmur heard. Pulmonary/Chest: Effort normal. No respiratory distress. He has rhonchi in the right lower field and the left lower field. He has rales in the right lower field and the left lower field.  Abdominal: Soft. There is no tenderness.  Musculoskeletal: He exhibits no edema.  Neurological: He is alert.  Skin: Skin is warm and dry.     Psychiatric: He has a normal mood and affect.  Nursing note and vitals reviewed.    ED Treatments / Results  Labs (all labs ordered are listed, but only abnormal results are displayed) Labs Reviewed  BASIC METABOLIC PANEL - Abnormal; Notable for the following:  Result Value   Chloride 100 (*)    Glucose, Bld 227 (*)    Creatinine, Ser 1.27 (*)    GFR calc non Af Amer 56 (*)    All other components within normal limits  CBC - Abnormal; Notable for the following:    WBC 14.4 (*)    RBC 3.12 (*)    Hemoglobin 10.3 (*)    HCT 31.7 (*)    MCV 101.6 (*)    Platelets 425 (*)    All other components within normal limits  CBG MONITORING, ED - Abnormal; Notable for the following:    Glucose-Capillary 223 (*)    All other components within normal limits  BRAIN NATRIURETIC PEPTIDE  I-STAT TROPOININ, ED    EKG  EKG Interpretation  Date/Time:  Wednesday September 03 2016 17:54:37 EDT Ventricular Rate:  101 PR Interval:  196 QRS Duration: 82 QT Interval:  374 QTC Calculation: 484 R Axis:   68 Text Interpretation:  Sinus tachycardia with frequent Premature ventricular complexes Nonspecific ST abnormality Abnormal ECG Since last tracing ectopy now present Confirmed by MILLER  MD, BRIAN ([id number]) on 09/03/2016 8:14:53 PM       Radiology Dg Chest 2 View  Result Date: 09/03/2016 CLINICAL DATA:  Shortness of breath cough and weakness today. EXAM: CHEST   2 VIEW COMPARISON:  09/14/2013 and prior chest radiographs FINDINGS: Cardiomegaly with mild interstitial pulmonary edema noted. Bilateral lower lung opacities probably represent edema/ atelectasis but superimposed infection is not entirely excluded. Small bilateral pleural effusions are noted. There is no evidence of pneumothorax. IMPRESSION: Cardiomegaly with mild interstitial pulmonary edema and small bilateral pleural effusions. Bilateral lower lung opacities likely representing edema/ atelectasis but superimposed infection is not entirely excluded. Correlate clinically. Electronically Signed   By: Margarette Canada M.D.   On: 09/03/2016 18:44    Procedures Procedures (including critical care time)  Medications Ordered in ED Medications  furosemide (LASIX) injection 20 mg (20 mg Intravenous Given 09/03/16 2041)     Initial Impression / Assessment and Plan / ED Course  I have reviewed the triage vital signs and the nursing notes.  Pertinent labs & imaging results that were available during my care of the patient were reviewed by me and considered in my medical decision making (see chart for details).     71 year old black male with history of hypertension presents in the setting of fatigue, shortness of breath, cough. Patient reports this is going on for approximately 3 weeks. He reports he continues to worsen. Patient reports he has some audible wheezing when he lays down. Patient denies history of lung problems in the past. Patient additionally endorses some mild swelling in bilateral lower extremity. Patient reports today he had a coughing spell that resulted in emesis. For this he came to emergency department.  On arrival patient hemodynamically stable and afebrile. Initial troponin negative. EKG reveals sinus tachycardia with no signs of acute ischemia, ST segment elevation or depression. Chest x-ray concerning for pulmonary congestion and a physical examination patient has JVD and Rales in  bilateral lower lobes. 1+ pitting edema bilateral lower extremities. I have concern for new onset heart failure at this time and will obtain BNP and given Lasix.  BNP reveals slight elevation. At this time as patient has new onset heart failure patient will require admission for further management as condition. Patient stable at time of admission to medicine service.  Final Clinical Impressions(s) / ED Diagnoses   Final diagnoses:  SOB (shortness of breath)  Cough  Other fatigue  JVD (jugular venous distension)  Pulmonary congestion    New Prescriptions New Prescriptions   No medications on file     Esaw Grandchild, MD 09/03/16 0228    Noemi Chapel, MD 09/04/16 [phone]

## 2016-09-03 NOTE — ED Provider Notes (Signed)
I saw and evaluated the patient, reviewed the resident's note and I agree with the findings and plan.  Pertinent History: The patient is a 71 year old male, he does have a history of hypertension and diabetes, he recently had his right great toe amputated.  He denies any fevers but has had a progressive shortness of breath, progressive dyspnea on exertion and progressive orthopnea.   Pertinent Exam findings: The pt has JVD, peripheral edema, rales and mild tachypnea with severe hypertention.  I was personally present and directly supervised the following procedures:  Likely new CHF, ECG without ischemia, BP very elevated, xray with pul edema needs admission   EKG Interpretation  Date/Time:  Wednesday September 03 2016 17:54:37 EDT Ventricular Rate:  101 PR Interval:  196 QRS Duration: 82 QT Interval:  374 QTC Calculation: 484 R Axis:   68 Text Interpretation:  Sinus tachycardia with frequent Premature ventricular complexes Nonspecific ST abnormality Abnormal ECG Since last tracing ectopy now present Confirmed by Astou Lada  MD, Lynlee Stratton ([id number]) on 09/03/2016 8:14:53 PM       I personally interpreted the EKG as well as the resident and agree with the interpretation on the resident's chart.  Final diagnoses:  SOB (shortness of breath)  Cough  Other fatigue  JVD (jugular venous distension)  Pulmonary congestion      Noemi Chapel, MD 09/04/16 [phone]

## 2016-09-03 NOTE — ED Triage Notes (Addendum)
Pt states cough and weakness for "some time".  States he came in today because he almost passed out after coughing.  States pain just below his sternum.

## 2016-09-03 NOTE — ED Notes (Signed)
Pt states at night he starts to feel more SOB

## 2016-09-03 NOTE — ED Notes (Signed)
Pt able to stand

## 2016-09-04 ENCOUNTER — Inpatient Hospital Stay (HOSPITAL_COMMUNITY): Payer: Medicare Other

## 2016-09-04 ENCOUNTER — Encounter (HOSPITAL_COMMUNITY): Payer: Self-pay | Admitting: *Deleted

## 2016-09-04 DIAGNOSIS — I5033 Acute on chronic diastolic (congestive) heart failure: Secondary | ICD-10-CM

## 2016-09-04 DIAGNOSIS — R0989 Other specified symptoms and signs involving the circulatory and respiratory systems: Secondary | ICD-10-CM

## 2016-09-04 DIAGNOSIS — E1165 Type 2 diabetes mellitus with hyperglycemia: Secondary | ICD-10-CM

## 2016-09-04 DIAGNOSIS — E785 Hyperlipidemia, unspecified: Secondary | ICD-10-CM

## 2016-09-04 LAB — GLUCOSE, CAPILLARY
GLUCOSE-CAPILLARY: 216 mg/dL — AB (ref 65–99)
Glucose-Capillary: 288 mg/dL — ABNORMAL HIGH (ref 65–99)
Glucose-Capillary: 238 mg/dL — ABNORMAL HIGH (ref 65–99)
Glucose-Capillary: 253 mg/dL — ABNORMAL HIGH (ref 65–99)
Glucose-Capillary: 82 mg/dL (ref 65–99)

## 2016-09-04 LAB — I-STAT TROPONIN, ED: Troponin i, poc: 0.03 ng/mL (ref 0.00–0.08)

## 2016-09-04 LAB — BASIC METABOLIC PANEL
ANION GAP: 10 (ref 5–15)
BUN: 18 mg/dL (ref 6–20)
CO2: 26 mmol/L (ref 22–32)
Calcium: 8.6 mg/dL — ABNORMAL LOW (ref 8.9–10.3)
Chloride: 99 mmol/L — ABNORMAL LOW (ref 101–111)
Creatinine, Ser: 1.27 mg/dL — ABNORMAL HIGH (ref 0.61–1.24)
GFR calc Af Amer: >60 mL/min (ref >60)
GFR, EST NON AFRICAN AMERICAN: 56 mL/min — AB (ref >60)
GLUCOSE: 328 mg/dL — AB (ref 65–99)
POTASSIUM: 3.6 mmol/L (ref 3.5–5.1)
Sodium: 135 mmol/L (ref 135–145)

## 2016-09-04 LAB — ECHOCARDIOGRAM COMPLETE
CHL CUP MV DEC (S): 156
CHL CUP TV REG PEAK VELOCITY: 357 cm/s
E/e' ratio: 10.8
EWDT: 156 ms
FS: 28 % (ref 28–44)
Height: 74 in
IV/PV OW: 0.79
LA vol: 100 mL
LADIAMINDEX: 1.84 cm/m2
LASIZE: 42 mm
LAVOLA4C: 109 mL
LAVOLIN: 43.9 mL/m2
LEFT ATRIUM END SYS DIAM: 42 mm
LV E/e'average: 10.8
LV PW d: 11.5 mm — AB (ref 0.6–1.1)
LV dias vol index: 53 mL/m2
LV dias vol: 120 mL (ref 62–150)
LV sys vol: 61 mL
LVEEMED: 10.8
LVELAT: 8.25 cm/s
LVOT SV: 61 mL
LVOT VTI: 19.4 cm
LVOT area: 3.14 cm2
LVOTD: 20 mm
LVOTPV: 92.7 cm/s
LVSYSVOLIN: 27 mL/m2
MV Peak grad: 3 mmHg
MV pk A vel: 55.7 m/s
MV pk E vel: 89.1 m/s
RV LATERAL S' VELOCITY: 12.1 cm/s
RV TAPSE: 23.6 mm
RV sys press: 59 mmHg
Simpson's disk: 49
Stroke v: 59 ml
TDI e' lateral: 8.25
TDI e' medial: 7.01
TR max vel: 357 cm/s
Weight: 3579.2 oz

## 2016-09-04 LAB — URINALYSIS, ROUTINE W REFLEX MICROSCOPIC
Bacteria, UA: NONE SEEN
Bilirubin Urine: NEGATIVE
GLUCOSE, UA: NEGATIVE mg/dL
KETONES UR: NEGATIVE mg/dL
LEUKOCYTES UA: NEGATIVE
Nitrite: NEGATIVE
PROTEIN: 100 mg/dL — AB
SQUAMOUS EPITHELIAL / LPF: NONE SEEN
Specific Gravity, Urine: 1.006 (ref 1.005–1.030)
pH: 5 (ref 5.0–8.0)

## 2016-09-04 LAB — CBC
HCT: 28.9 % — ABNORMAL LOW (ref 39.0–52.0)
HEMATOCRIT: 29.4 % — AB (ref 39.0–52.0)
Hemoglobin: 9.4 g/dL — ABNORMAL LOW (ref 13.0–17.0)
Hemoglobin: 9.4 g/dL — ABNORMAL LOW (ref 13.0–17.0)
MCH: 32.4 pg (ref 26.0–34.0)
MCH: 33 pg (ref 26.0–34.0)
MCHC: 32 g/dL (ref 30.0–36.0)
MCHC: 32.5 g/dL (ref 30.0–36.0)
MCV: 101.4 fL — AB (ref 78.0–100.0)
MCV: 101.4 fL — ABNORMAL HIGH (ref 78.0–100.0)
PLATELETS: 376 10*3/uL (ref 150–400)
PLATELETS: 378 10*3/uL (ref 150–400)
RBC: 2.9 MIL/uL — ABNORMAL LOW (ref 4.22–5.81)
RBC: 2.85 MIL/uL — ABNORMAL LOW (ref 4.22–5.81)
RDW: 12.3 % (ref 11.5–15.5)
RDW: 12.4 % (ref 11.5–15.5)
WBC: 12 10*3/uL — AB (ref 4.0–10.5)
WBC: 10.7 10*3/uL — ABNORMAL HIGH (ref 4.0–10.5)

## 2016-09-04 LAB — TROPONIN I
TROPONIN I: 0.05 ng/mL — AB (ref <0.03)
TROPONIN I: 0.06 ng/mL — AB (ref <0.03)
Troponin I: 0.05 ng/mL (ref <0.03)

## 2016-09-04 LAB — PROTIME-INR
INR: 1.21
PROTHROMBIN TIME: 15.4 s — AB (ref 11.4–15.2)

## 2016-09-04 LAB — TSH: TSH: 1.411 u[IU]/mL (ref 0.350–4.500)

## 2016-09-04 LAB — MRSA PCR SCREENING: MRSA by PCR: NEGATIVE

## 2016-09-04 MED ORDER — ASPIRIN EC 81 MG PO TBEC
81.0000 mg | DELAYED_RELEASE_TABLET | Freq: Every day | ORAL | Status: DC
  Administered 2016-09-04 – 2016-09-08 (×5): 81 mg via ORAL
  Filled 2016-09-04 (×5): qty 1

## 2016-09-04 MED ORDER — HYDRALAZINE HCL 20 MG/ML IJ SOLN: 10.0000 mg | Freq: Four times a day (QID) | INTRAMUSCULAR | Status: DC | PRN

## 2016-09-04 MED ORDER — ACETAMINOPHEN 650 MG RE SUPP: 650.0000 mg | Freq: Four times a day (QID) | RECTAL | Status: DC | PRN

## 2016-09-04 MED ORDER — LISINOPRIL 10 MG PO TABS
10.0000 mg | ORAL_TABLET | Freq: Every day | ORAL | Status: DC
  Administered 2016-09-04: 10 mg via ORAL
  Filled 2016-09-04: qty 1

## 2016-09-04 MED ORDER — GABAPENTIN 600 MG PO TABS
600.0000 mg | ORAL_TABLET | Freq: Two times a day (BID) | ORAL | Status: DC
  Administered 2016-09-04 – 2016-09-08 (×10): 600 mg via ORAL
  Filled 2016-09-04 (×10): qty 1

## 2016-09-04 MED ORDER — METOPROLOL TARTRATE 25 MG PO TABS
25.0000 mg | ORAL_TABLET | Freq: Two times a day (BID) | ORAL | Status: DC
  Administered 2016-09-04: 25 mg via ORAL
  Filled 2016-09-04: qty 1

## 2016-09-04 MED ORDER — AMLODIPINE BESYLATE 10 MG PO TABS
10.0000 mg | ORAL_TABLET | Freq: Every day | ORAL | Status: DC
  Administered 2016-09-04 – 2016-09-05 (×2): 10 mg via ORAL
  Filled 2016-09-04 (×2): qty 1

## 2016-09-04 MED ORDER — LATANOPROST 0.005 % OP SOLN
1.0000 [drp] | Freq: Every day | OPHTHALMIC | Status: DC
Start: 2016-09-04 — End: 2016-09-08
  Administered 2016-09-04 – 2016-09-07 (×5): 1 [drp] via OPHTHALMIC
  Filled 2016-09-04: qty 2.5

## 2016-09-04 MED ORDER — FUROSEMIDE 10 MG/ML IJ SOLN
40.0000 mg | Freq: Every day | INTRAMUSCULAR | Status: DC
  Administered 2016-09-04: 40 mg via INTRAVENOUS
  Filled 2016-09-04: qty 4

## 2016-09-04 MED ORDER — INSULIN ASPART 100 UNIT/ML ~~LOC~~ SOLN
0.0000 [IU] | Freq: Three times a day (TID) | SUBCUTANEOUS | Status: DC
  Administered 2016-09-04 (×2): 3 [IU] via SUBCUTANEOUS
  Administered 2016-09-04: 5 [IU] via SUBCUTANEOUS
  Administered 2016-09-05: 2 [IU] via SUBCUTANEOUS
  Administered 2016-09-05: 1 [IU] via SUBCUTANEOUS
  Administered 2016-09-05: 3 [IU] via SUBCUTANEOUS
  Administered 2016-09-06: 2 [IU] via SUBCUTANEOUS
  Administered 2016-09-06 – 2016-09-07 (×3): 1 [IU] via SUBCUTANEOUS
  Administered 2016-09-07: 2 [IU] via SUBCUTANEOUS
  Administered 2016-09-08: 1 [IU] via SUBCUTANEOUS

## 2016-09-04 MED ORDER — ACETAMINOPHEN 325 MG PO TABS: 650.0000 mg | ORAL_TABLET | Freq: Four times a day (QID) | ORAL | Status: DC | PRN

## 2016-09-04 MED ORDER — HYDRALAZINE HCL 10 MG PO TABS
10.0000 mg | ORAL_TABLET | Freq: Four times a day (QID) | ORAL | Status: DC | PRN
  Administered 2016-09-04: 10 mg via ORAL
  Filled 2016-09-04: qty 1

## 2016-09-04 MED ORDER — FUROSEMIDE 40 MG PO TABS: 40.0000 mg | ORAL_TABLET | Freq: Every day | ORAL | Status: DC

## 2016-09-04 MED ORDER — CHLORTHALIDONE 25 MG PO TABS
25.0000 mg | ORAL_TABLET | Freq: Every morning | ORAL | Status: DC
  Filled 2016-09-04: qty 1

## 2016-09-04 MED ORDER — INSULIN DETEMIR 100 UNIT/ML ~~LOC~~ SOLN
62.0000 [IU] | Freq: Every day | SUBCUTANEOUS | Status: DC
Start: 2016-09-04 — End: 2016-09-05
  Administered 2016-09-04: 62 [IU] via SUBCUTANEOUS
  Filled 2016-09-04 (×2): qty 0.62

## 2016-09-04 MED ORDER — FUROSEMIDE 10 MG/ML IJ SOLN
40.0000 mg | Freq: Two times a day (BID) | INTRAMUSCULAR | Status: DC
  Administered 2016-09-04: 40 mg via INTRAVENOUS
  Filled 2016-09-04: qty 4

## 2016-09-04 MED ORDER — ENOXAPARIN SODIUM 40 MG/0.4ML ~~LOC~~ SOLN
40.0000 mg | SUBCUTANEOUS | Status: DC
  Administered 2016-09-04 – 2016-09-07 (×4): 40 mg via SUBCUTANEOUS
  Filled 2016-09-04 (×4): qty 0.4

## 2016-09-04 MED ORDER — ATORVASTATIN CALCIUM 40 MG PO TABS
40.0000 mg | ORAL_TABLET | Freq: Every day | ORAL | Status: DC
  Administered 2016-09-04 – 2016-09-07 (×4): 40 mg via ORAL
  Filled 2016-09-04 (×4): qty 1

## 2016-09-04 MED ORDER — METOPROLOL TARTRATE 25 MG PO TABS
25.0000 mg | ORAL_TABLET | Freq: Two times a day (BID) | ORAL | Status: DC
  Administered 2016-09-05 – 2016-09-08 (×7): 25 mg via ORAL
  Filled 2016-09-04 (×7): qty 1

## 2016-09-04 MED ORDER — DULAGLUTIDE 1.5 MG/0.5ML ~~LOC~~ SOAJ: 1.5000 mg | SUBCUTANEOUS | Status: DC

## 2016-09-04 MED ORDER — HYDROCODONE-ACETAMINOPHEN 10-325 MG PO TABS
1.0000 | ORAL_TABLET | Freq: Four times a day (QID) | ORAL | Status: DC | PRN
  Administered 2016-09-04 (×3): 1 via ORAL
  Administered 2016-09-05 – 2016-09-06 (×5): 2 via ORAL
  Administered 2016-09-07 – 2016-09-08 (×4): 1 via ORAL
  Filled 2016-09-04: qty 2
  Filled 2016-09-04: qty 1
  Filled 2016-09-04: qty 2
  Filled 2016-09-04: qty 1
  Filled 2016-09-04: qty 2
  Filled 2016-09-04: qty 1
  Filled 2016-09-04 (×2): qty 2
  Filled 2016-09-04: qty 1
  Filled 2016-09-04: qty 2
  Filled 2016-09-04 (×2): qty 1

## 2016-09-04 MED ORDER — ISOSORB DINITRATE-HYDRALAZINE 20-37.5 MG PO TABS
1.0000 | ORAL_TABLET | Freq: Three times a day (TID) | ORAL | Status: DC
  Administered 2016-09-04 – 2016-09-08 (×11): 1 via ORAL
  Filled 2016-09-04 (×11): qty 1

## 2016-09-04 MED ORDER — ALPRAZOLAM 0.5 MG PO TABS
1.0000 mg | ORAL_TABLET | Freq: Two times a day (BID) | ORAL | Status: DC | PRN
  Administered 2016-09-04 – 2016-09-05 (×2): 1 mg via ORAL
  Filled 2016-09-04 (×2): qty 2

## 2016-09-04 MED ORDER — INSULIN ASPART 100 UNIT/ML ~~LOC~~ SOLN
0.0000 [IU] | Freq: Every day | SUBCUTANEOUS | Status: DC
  Administered 2016-09-04: 3 [IU] via SUBCUTANEOUS

## 2016-09-04 MED ORDER — INSULIN DETEMIR 100 UNIT/ML ~~LOC~~ SOLN
55.0000 [IU] | Freq: Every day | SUBCUTANEOUS | Status: DC
  Filled 2016-09-04: qty 0.55

## 2016-09-04 MED ORDER — DOCUSATE SODIUM 100 MG PO CAPS
100.0000 mg | ORAL_CAPSULE | Freq: Every day | ORAL | Status: DC
  Administered 2016-09-04 – 2016-09-08 (×5): 100 mg via ORAL
  Filled 2016-09-04 (×5): qty 1

## 2016-09-04 MED ORDER — SODIUM CHLORIDE 0.9% FLUSH
3.0000 mL | Freq: Two times a day (BID) | INTRAVENOUS | Status: DC
  Administered 2016-09-04 – 2016-09-08 (×10): 3 mL via INTRAVENOUS

## 2016-09-04 MED ORDER — HYDROCODONE-ACETAMINOPHEN 10-325 MG PO TABS: 1.0000 | ORAL_TABLET | Freq: Two times a day (BID) | ORAL | Status: DC | PRN

## 2016-09-04 NOTE — Progress Notes (Signed)
PT Cancellation Note  Patient Details Name: David [name] MRN: [id number] DOB: Nov 26, 1945   Cancelled Treatment:    Reason Eval/Treat Not Completed: Patient declined, no reason specified. Pt just finishing up an ultrasound and has to eat breakfast. Requesting PT hold until later on. Will check back as time allows.    Scheryl Marten PT, DPT  [phone]  09/04/2016, 8:52 AM

## 2016-09-04 NOTE — Progress Notes (Signed)
Schorr  was informed  Via text of troponin of 0.05

## 2016-09-04 NOTE — Progress Notes (Signed)
  Echocardiogram 2D Echocardiogram has been performed.  Johny Chess 09/04/2016, 9:02 AM

## 2016-09-04 NOTE — Progress Notes (Signed)
Pt c/o generalized pain 8/20.  Has Norco ordered and pt says he is allergic to it.  Notified Dr. Sonia Side via web link.  Will continue to monitor.  Karie Kirks, Therapist, sports.

## 2016-09-04 NOTE — Evaluation (Signed)
Physical Therapy Evaluation Patient Details Name: David [name] MRN: [id number] DOB: May 26, 1945 Today's Date: 09/04/2016   History of Present Illness  Pt is a 71 yo male admitted through ED for increased dyspnea and swelling over the past 3 weeks. Per chart questioning myocarditis vs HF. PMH significant for HTN, Glaucoma, GERD, DM2, OA, Anxiety, JVD.     Clinical Impression  Pt presents with the above diagnosis and below deficits. Prior to admission, pt was completely independent with adll ADLs and IADLs including driving himself. Pt was no reliant on O2 prior to admission. Pt lives with his wife in a two story home and bedroom/bathroom is on the second floor. During this session, pt's O2 sats remain above 90%at rest but drop to 84% with short distance gait with pt having increased dyspnea following. Pt will benefit from continued acute PT services in order to address the below deficits prior to discharge to venue recommended below.     Follow Up Recommendations Home health PT;Supervision for mobility/OOB    Equipment Recommendations  None recommended by PT    Recommendations for Other Services       Precautions / Restrictions Precautions Precautions: Fall Precaution Comments: reports a fall s/p 1 month ago Restrictions Weight Bearing Restrictions: No      Mobility  Bed Mobility Overal bed mobility: Modified Independent             General bed mobility comments: able to get EOB without assistance  Transfers Overall transfer level: Needs assistance Equipment used: Rolling walker (2 wheeled) Transfers: Sit to/from Stand Sit to Stand: Min guard         General transfer comment: min guard for safety from EOB to RW  Ambulation/Gait Ambulation/Gait assistance: Min guard Ambulation Distance (Feet): 45 Feet Assistive device: Rolling walker (2 wheeled) Gait Pattern/deviations: Step-through pattern;Decreased step length - right;Decreased step length - left Gait  velocity: decreased Gait velocity interpretation: Below normal speed for age/gender General Gait Details: decreased step length and minimal forward trunk lean noted with mobility  Stairs            Wheelchair Mobility    Modified Rankin (Stroke Patients Only)       Balance Overall balance assessment: Needs assistance Sitting-balance support: No upper extremity supported;Feet supported Sitting balance-Leahy Scale: Good     Standing balance support: Bilateral upper extremity supported;During functional activity;No upper extremity supported Standing balance-Leahy Scale: Fair Standing balance comment: Relies on RW for gait, able to stand briefly to don gait belt without UE support                             Pertinent Vitals/Pain Pain Assessment: No/denies pain    Home Living Family/patient expects to be discharged to:: Private residence Living Arrangements: Spouse/significant other Available Help at Discharge: Family Type of Home: House Home Access: Stairs to enter Entrance Stairs-Rails: Psychiatric nurse of Steps: 3 Home Layout: Two level;Bed/bath upstairs Home Equipment: Walker - 2 wheels;Cane - single point      Prior Function Level of Independence: Independent with assistive device(s)         Comments: was doing for himself and driving      Hand Dominance   Dominant Hand: Right    Extremity/Trunk Assessment   Upper Extremity Assessment Upper Extremity Assessment: Overall WFL for tasks assessed    Lower Extremity Assessment Lower Extremity Assessment: Generalized weakness       Communication   Communication:  No difficulties  Cognition Arousal/Alertness: Awake/alert Behavior During Therapy: WFL for tasks assessed/performed Overall Cognitive Status: Within Functional Limits for tasks assessed                                        General Comments      Exercises     Assessment/Plan    PT  Assessment Patient needs continued PT services  PT Problem List Decreased activity tolerance;Decreased balance;Decreased mobility;Decreased knowledge of use of DME       PT Treatment Interventions DME instruction;Gait training;Stair training;Functional mobility training;Therapeutic activities;Therapeutic exercise;Balance training    PT Goals (Current goals can be found in the Care Plan section)  Acute Rehab PT Goals Patient Stated Goal: to get home PT Goal Formulation: With patient Time For Goal Achievement: 09/11/16 Potential to Achieve Goals: Good    Frequency Min 3X/week   Barriers to discharge        Co-evaluation               End of Session Equipment Utilized During Treatment: Gait belt Activity Tolerance: Patient limited by fatigue Patient left: in bed;with call bell/phone within reach;with family/visitor present Nurse Communication: Mobility status PT Visit Diagnosis: Muscle weakness (generalized) (M62.81);Difficulty in walking, not elsewhere classified (R26.2)    Time: 3086-5784 PT Time Calculation (min) (ACUTE ONLY): 24 min   Charges:   PT Evaluation $PT Eval Moderate Complexity: 1 Procedure PT Treatments $Gait Training: 8-22 mins   PT G Codes:        David [name] PT, DPT  [phone]   Jacqulyn Liner Sloan Leiter 09/04/2016, 12:57 PM

## 2016-09-04 NOTE — Progress Notes (Signed)
Notified by Dr. Sonia Side that hydrocononde-acetaminophen that's what pt has been taken at home and wife says that's what pt take at home, just don't like the word norco with the med.  Also spoke with pt's wife who is at the bedside stated the same to nurse.  Karie Kirks, Therapist, sports.

## 2016-09-04 NOTE — Consult Note (Addendum)
CARDIOLOGY CONSULT NOTE  Patient ID: David [name] MRN: [id number] DOB/AGE: [birth year] 70 y.o.  Admit date: 09/03/2016 Referring Physician  Foy [place], MD Primary Physician:  Eloise Levels, NP Reason for Consultation  CHF  HPI: David [name]  is a 71 y.o. male  With History of diabetes with peripheral neuropathy, diabetic toe ulcer, nonhealing status post amputation of the right great toe on 07/09/2016 by [name], chronic back pain, hypertension, hyperlipidemia, admitted to the hospital with 3-4 weeks of gradual onset of worsening shortness of breath and dyspnea on exertion. Over the past 1-2 weeks, he had marked dyspnea even to do minimal activities of daily living and hence presented to the emergency room. Denied any frank chest pain or chest tightness. No history to suggest TIA or claudication.  Past Medical History:  Diagnosis Date  . Arthritis   . DDD (degenerative disc disease), cervical   . Diabetes mellitus without complication ([place])    type II  . Diabetic neuropathy ([place])   . GERD (gastroesophageal reflux disease)   . Glaucoma   . Hypertension      Past Surgical History:  Procedure Laterality Date  . AMPUTATION Right 07/09/2016   Procedure: Right Great Toe Amputation at Metatarsophalangeal Joint;  Surgeon: [name], MD;  Location: [facility];  Service: Orthopedics;  Laterality: Right;  . BACK SURGERY     4  . CYST EXCISION     on Back  . JOINT REPLACEMENT    . TOTAL HIP ARTHROPLASTY     Right      Family History  Problem Relation Age of Onset  . Diabetes Other   . Hyperlipidemia Other   . Hypertension Other   . Stroke Other   . Alzheimer's disease Other   . Thyroid disease Mother   . Diabetes Mellitus II Father      Social History: Social History   Social History  . Marital status: Married    Spouse name: N/A  . Number of children: N/A  . Years of education: N/A   Occupational History  . Not on file.   Social History Main Topics  .  Smoking status: Never Smoker  . Smokeless tobacco: Never Used  . Alcohol use Yes     Comment: occ  . Drug use: No  . Sexual activity: Yes   Other Topics Concern  . Not on file   Social History Narrative  . No history of tobacco use, illicit drug use.      Prescriptions Prior to Admission  Medication Sig Dispense Refill Last Dose  . ALPRAZolam (XANAX) 1 MG tablet TAKE 1 TABLET BY MOUTH UP TO TWICE DAILY AS NEEDED  2 Past Week at Unknown time  . amLODipine (NORVASC) 10 MG tablet Take 1 tablet (10 mg total) by mouth daily. 30 tablet 1 09/03/2016 at Unknown time  . aspirin EC 81 MG tablet Take 81 mg by mouth daily.   09/03/2016 at Unknown time  . atorvastatin (LIPITOR) 40 MG tablet Take 40 mg by mouth at bedtime.  11 09/03/2016 at Unknown time  . chlorthalidone (HYGROTON) 25 MG tablet Take 25 mg by mouth every morning.  5 09/03/2016 at Unknown time  . docusate sodium (COLACE) 100 MG capsule Take 100 mg by mouth daily.   09/03/2016 at Unknown time  . gabapentin (NEURONTIN) 600 MG tablet Take 600 mg by mouth 2 (two) times daily.    Past Week at Unknown time  . HYDROcodone-acetaminophen (NORCO) 10-325 MG tablet Take  1 tablet by mouth 2 (two) times daily as needed for moderate pain.   0 09/03/2016 at Unknown time  . LEVEMIR FLEXTOUCH 100 UNIT/ML Pen Inject 55 Units into the skin daily.    09/02/2016 at Unknown time  . LUMIGAN 0.01 % SOLN Place 1 drop into both eyes at bedtime.  99 09/02/2016 at Unknown time  . TRULICITY 1.5 YN/8.2NF SOPN Take 1.5 mg by mouth every Sunday.  11 Past Month at Unknown time  . B-D UF III MINI PEN NEEDLES 31G X 5 MM MISC USE TO INJECT INSULIN AS DIRECTED  1 Taking  . doxycycline (VIBRA-TABS) 100 MG tablet TAKE 1 TABLET BY MOUTH TWICE A DAY (Patient not taking: Reported on 09/03/2016) 60 tablet 0 Completed Course at Unknown time   ROS:  marked dyspnea, orthopnea, denies symptoms of claudication or TIA, no dark stools or bloody stools. No recent weight changes.     Physical  Exam: Blood pressure (!) 156/71, pulse 85, temperature 99.2 F (37.3 C), temperature source Oral, resp. rate 18, height 6\' 2"  (1.88 m), weight 101.5 kg (223 lb 11.2 oz), SpO2 99 %.   General appearance: alert, cooperative, appears stated age, mild distress and mildly obese Lungs: Bilateral basilar crackles at the bases posteriorly. Heart: S1, S2 normal, S4 present and systolic murmur: early systolic 2/6, blowing at 2nd left intercostal space, at lower left sternal border Abdomen: soft, non-tender; bowel sounds normal; no masses,  no organomegaly Extremities: 2+ leg edema. Skin is warm. No signs of acute arterial insufficiency. Right greater toe amputation site is healed well. Pulses: 2+ and symmetric Neurologic: Grossly normal  Labs:   Lab Results  Component Value Date   WBC 10.7 (H) 09/04/2016   HGB 9.4 (L) 09/04/2016   HCT 28.9 (L) 09/04/2016   MCV 101.4 (H) 09/04/2016   PLT 378 09/04/2016    Recent Labs Lab 09/03/16 2255 09/04/16 0119  NA  --  135  K  --  3.6  CL  --  99*  CO2  --  26  BUN  --  18  CREATININE  --  1.27*  CALCIUM  --  8.6*  PROT 7.9  --   BILITOT 1.0  --   ALKPHOS 191*  --   ALT 42  --   AST 23  --   GLUCOSE  --  328*   BNP (last 3 results)  Recent Labs  09/03/16 1802  BNP 158.8*    HEMOGLOBIN A1C Lab Results  Component Value Date   HGBA1C 7.7 (H) 08/14/2013   MPG 174 (H) 08/14/2013    Cardiac Panel (last 3 results)  Recent Labs  09/04/16 0119 09/04/16 0700 09/04/16 1236  TROPONINI 0.05* 0.06* 0.05*   TSH  Recent Labs  09/04/16 0119  TSH 1.411    EKG 09/04/2016: Sinus rhythm at the rate of 86 bpm with first-degree AV block, left atrial enlargement, LVH. PVC. Nonspecific T abnormality.  Echo:   09/04/2016: Left ventricle: The cavity size was normal. Systolic function was   mildly to moderately reduced. The estimated ejection fraction was   in the range of 40% to 45%. Diffuse hypokinesis. Left ventricular   diastolic function  parameters were normal. - Mitral valve: There was mild regurgitation. - Left atrium: The atrium was mildly dilated. - The right ventricular systolic pressure was  increased consistent with severe pulmonary hypertension. PA  systolic pressure 66 mm Hg.   Radiology: Dg Chest 2 View  Result Date: 09/03/2016 CLINICAL DATA:  Shortness of breath cough and weakness today. EXAM: CHEST  2 VIEW COMPARISON:  09/14/2013 and prior chest radiographs FINDINGS: Cardiomegaly with mild interstitial pulmonary edema noted. Bilateral lower lung opacities probably represent edema/ atelectasis but superimposed infection is not entirely excluded. Small bilateral pleural effusions are noted. There is no evidence of pneumothorax. IMPRESSION: Cardiomegaly with mild interstitial pulmonary edema and small bilateral pleural effusions. Bilateral lower lung opacities likely representing edema/ atelectasis but superimposed infection is not entirely excluded. Correlate clinically. Electronically Signed   By: Margarette Canada M.D.   On: 09/03/2016 18:44   Dg Chest Port 1 View  Result Date: 09/04/2016 CLINICAL DATA:  Shortness of breath, hypertension, diabetes EXAM: PORTABLE CHEST 1 VIEW COMPARISON:  PA and lateral chest x-ray of September 03, 2016 FINDINGS: The lungs are reasonably well inflated. The interstitial markings have improved considerably. The cardiac silhouette remains mildly enlarged. The pulmonary vascularity is less engorged. The left hemidiaphragm is partially obscured. There is calcification in the wall of the aortic arch. The bony thorax is unremarkable. IMPRESSION: Interval improvement in pulmonary interstitial edema. Persistent mild left basilar subsegmental atelectasis and small left pleural effusion. Thoracic aortic atherosclerosis. Electronically Signed   By: David  Martinique M.D.   On: 09/04/2016 07:23    Scheduled Meds: . amLODipine  10 mg Oral Daily  . aspirin EC  81 mg Oral Daily  . atorvastatin  40 mg Oral QHS  .  docusate sodium  100 mg Oral Daily  . [START ON 09/07/2016] Dulaglutide  1.5 mg Oral Q Sun  . enoxaparin (LOVENOX) injection  40 mg Subcutaneous Q24H  . furosemide  40 mg Intravenous BID  . gabapentin  600 mg Oral BID  . insulin aspart  0-5 Units Subcutaneous QHS  . insulin aspart  0-9 Units Subcutaneous TID WC  . insulin detemir  62 Units Subcutaneous Daily  . latanoprost  1 drop Both Eyes QHS  . lisinopril  10 mg Oral Daily  . [START ON 09/05/2016] metoprolol tartrate  25 mg Oral BID  . sodium chloride flush  3 mL Intravenous Q12H   Continuous Infusions: PRN Meds:.acetaminophen **OR** acetaminophen, ALPRAZolam, hydrALAZINE, HYDROcodone-acetaminophen  ASSESSMENT AND PLAN:  1. Acute on chronic systolic and diastolic heart failure, LVEF reduced mild to moderately at 40-45% with global hypokinesis. 2. Diabetes mellitus type 2 uncontrolled 3. Hypertension with hypertensive heart disease, S4 gallop. 4. Diabetic peripheral neuropathy 5. Hyperlipidemia  6. Anemia, macrocytic  Recommendation: I discussed with the patient and his wife at the bedside, patient has very poor lifestyle. They eat out every day, very large portion size, no restrictions with regard to calories or salt restriction. I had a lengthy discussion regarding making lifestyle changes. I have added lisinopril 10 mg daily and will continue IV diuresis and increase the dose of metoprolol. We'll consider addition of Aldactone if no aggressive diuresis and blood pressure is not well maintained. We will also start the patient on BiDil one by mouth 3 times a day. Once heart failure symptoms are stable, will consider further cardiac workup. He will need outpatient referral for diabetes control and diabetes education. Anemia needs workup.   Adrian Prows, MD 09/04/2016, 8:39 AM [facility] [place] Pager: [phone] Office: [phone] If no answer Cell [phone]

## 2016-09-04 NOTE — Progress Notes (Signed)
PROGRESS NOTE        PATIENT DETAILS Name: David [name] Age: 71 y.o. Sex: male Date of Birth: 1946/03/08 Admit Date: 09/03/2016 Admitting Physician Sid Falcon, MD FAO:[id number] Brown, NP  Brief Narrative: Patient is a 71 y.o. male male with history of diabetes, hypertension, dyslipidemia, recent right great toe amputation (07/09/16) admitted with worsening exertional dyspnea, lower extremity edema, thought to have new onset CHF and admitted to the hospitalist service. See below for further details  Subjective: Continues to have exertional dyspnea-still not yet back to his baseline. Lower extremity edema has improved. Denies any chest pain.  Assessment/Plan: New onset decompensated CHF: Although improved with decrease lower extremity edema, continues to have rales,+ JVD and shortness of breath with just walking to the bathroom. Continue intravenous Lasix-and low-dose ACE inhibitor. Await echo to determine EF-cardiology consulted await further recommendations.   Hypertensive urgency: On Lasix-have added ACE inhibitor, cardiology has added beta blocker and amlodipine. Will continue to follow and optimize accordingly.  DM-2: Increase Lantus to 62 units, continue SSI. Follow and adjust accordingly.  Dyslipidemia: Continue statin.  Peripheral neuropathy: Probably related to diabetes, continue Neurontin.  Anemia: Probably related to underlying chronic kidney disease. Stable for outpatient workup.  Chronic kidney disease stage stage III: Prior urinalysis showed proteinuria-suspect diabetic nephropathy. Started on lisinopril-follow.  Anxiety: Continue Xanax  Recent right great toe amputation: Stump site looks clean.  Chronic pain syndrome: Has chronic back pain-claims has had multiple back surgeries-continue home regimen of narcotics.  DVT Prophylaxis: Prophylactic Lovenox   Code Status: Full code  Family Communication: Spouse at  bedside  Disposition Plan: Remain inpatient-home in the next few days  Antimicrobial agents: Anti-infectives    None     Procedures: None  CONSULTS:  cardiology  Time spent: 25- minutes-Greater than 50% of this time was spent in counseling, explanation of diagnosis, planning of further management, and coordination of care.  MEDICATIONS: Scheduled Meds: . amLODipine  10 mg Oral Daily  . aspirin EC  81 mg Oral Daily  . atorvastatin  40 mg Oral QHS  . docusate sodium  100 mg Oral Daily  . [START ON 09/07/2016] Dulaglutide  1.5 mg Oral Q Sun  . enoxaparin (LOVENOX) injection  40 mg Subcutaneous Q24H  . furosemide  40 mg Intravenous BID  . gabapentin  600 mg Oral BID  . insulin aspart  0-5 Units Subcutaneous QHS  . insulin aspart  0-9 Units Subcutaneous TID WC  . insulin detemir  62 Units Subcutaneous Daily  . latanoprost  1 drop Both Eyes QHS  . lisinopril  10 mg Oral Daily  . [START ON 09/05/2016] metoprolol tartrate  25 mg Oral BID  . sodium chloride flush  3 mL Intravenous Q12H   Continuous Infusions: PRN Meds:.acetaminophen **OR** acetaminophen, ALPRAZolam, hydrALAZINE, HYDROcodone-acetaminophen   PHYSICAL EXAM: Vital signs: Vitals:   09/04/16 0036 09/04/16 0636 09/04/16 0820 09/04/16 0947  BP: (!) 173/87 (!) 167/97 (!) 156/71 (!) 146/71  Pulse:  85 85   Resp:  16 18   Temp:  98.7 F (37.1 C) 99.2 F (37.3 C)   TempSrc:  Oral Oral   SpO2:  100% 99%   Weight:      Height:       Filed Weights   09/03/16 1751 09/04/16 0035  Weight: 106.6 kg (235 lb) 101.5 kg (223 lb 11.2  oz)   Body mass index is 28.72 kg/m.   General appearance :Awake, alert, not in any distress. Speech Clear. Not toxic Looking Eyes:, pupils equally reactive to light and accomodation,no scleral icterus.Pink conjunctiva HEENT: Atraumatic and Normocephalic Neck: supple, + JVD. No cervical lymphadenopathy. No thyromegaly Resp:Good air entry bilaterally, Bibasilar rales CVS: S1 S2 regular,  no murmurs.  GI: Bowel sounds present, Non tender and not distended with no gaurding, rigidity or rebound.No organomegaly Extremities: B/L Lower Ext shows no edema, both legs are warm to touch Neurology:  speech clear,Non focal, sensation is grossly intact. Musculoskeletal:No digital cyanosis Skin:No Rash, warm and dry Wounds:N/A  I have personally reviewed following labs and imaging studies  LABORATORY DATA: CBC:  Recent Labs Lab 09/03/16 1802 09/04/16 0119 09/04/16 0700  WBC 14.4* 12.0* 10.7*  HGB 10.3* 9.4* 9.4*  HCT 31.7* 29.4* 28.9*  MCV 101.6* 101.4* 101.4*  PLT 425* 376 824    Basic Metabolic Panel:  Recent Labs Lab 09/03/16 1802 09/04/16 0119  NA 138 135  K 3.8 3.6  CL 100* 99*  CO2 25 26  GLUCOSE 227* 328*  BUN 18 18  CREATININE 1.27* 1.27*  CALCIUM 9.2 8.6*    GFR: Estimated Creatinine Clearance: 69.8 mL/min (A) (by C-G formula based on SCr of 1.27 mg/dL (H)).  Liver Function Tests:  Recent Labs Lab 09/03/16 2255  AST 23  ALT 42  ALKPHOS 191*  BILITOT 1.0  PROT 7.9  ALBUMIN 3.4*   No results for input(s): LIPASE, AMYLASE in the last 168 hours. No results for input(s): AMMONIA in the last 168 hours.  Coagulation Profile:  Recent Labs Lab 09/04/16 0700  INR 1.21    Cardiac Enzymes:  Recent Labs Lab 09/04/16 0119 09/04/16 0700  TROPONINI 0.05* 0.06*    BNP (last 3 results) No results for input(s): PROBNP in the last 8760 hours.  HbA1C: No results for input(s): HGBA1C in the last 72 hours.  CBG:  Recent Labs Lab 09/03/16 1801 09/04/16 0103 09/04/16 0740 09/04/16 1201  GLUCAP 223* 288* 238* 253*    Lipid Profile: No results for input(s): CHOL, HDL, LDLCALC, TRIG, CHOLHDL, LDLDIRECT in the last 72 hours.  Thyroid Function Tests:  Recent Labs  09/04/16 0119  TSH 1.411    Anemia Panel: No results for input(s): VITAMINB12, FOLATE, FERRITIN, TIBC, IRON, RETICCTPCT in the last 72 hours.  Urine analysis:     Component Value Date/Time   COLORURINE YELLOW 02/13/2015 1934   APPEARANCEUR CLEAR 02/13/2015 1934   LABSPEC 1.026 02/13/2015 1934   PHURINE 7.5 02/13/2015 1934   GLUCOSEU 500 (A) 02/13/2015 1934   HGBUR SMALL (A) 02/13/2015 1934   BILIRUBINUR NEGATIVE 02/13/2015 1934   KETONESUR 40 (A) 02/13/2015 1934   PROTEINUR >300 (A) 02/13/2015 1934   UROBILINOGEN 1.0 02/13/2015 1934   NITRITE NEGATIVE 02/13/2015 1934   LEUKOCYTESUR NEGATIVE 02/13/2015 1934    Sepsis Labs: Lactic Acid, Venous    Component Value Date/Time   LATICACIDVEN 1.43 02/13/2015 1958    MICROBIOLOGY: Recent Results (from the past 240 hour(s))  MRSA PCR Screening     Status: None   Collection Time: 09/04/16 12:48 AM  Result Value Ref Range Status   MRSA by PCR NEGATIVE NEGATIVE Final    Comment:        The GeneXpert MRSA Assay (FDA approved for NASAL specimens only), is one component of a comprehensive MRSA colonization surveillance program. It is not intended to diagnose MRSA infection nor to guide or monitor treatment for  MRSA infections.     RADIOLOGY STUDIES/RESULTS: Dg Chest 2 View  Result Date: 09/03/2016 CLINICAL DATA:  Shortness of breath cough and weakness today. EXAM: CHEST  2 VIEW COMPARISON:  09/14/2013 and prior chest radiographs FINDINGS: Cardiomegaly with mild interstitial pulmonary edema noted. Bilateral lower lung opacities probably represent edema/ atelectasis but superimposed infection is not entirely excluded. Small bilateral pleural effusions are noted. There is no evidence of pneumothorax. IMPRESSION: Cardiomegaly with mild interstitial pulmonary edema and small bilateral pleural effusions. Bilateral lower lung opacities likely representing edema/ atelectasis but superimposed infection is not entirely excluded. Correlate clinically. Electronically Signed   By: Margarette Canada M.D.   On: 09/03/2016 18:44   Dg Chest Port 1 View  Result Date: 09/04/2016 CLINICAL DATA:  Shortness of breath,  hypertension, diabetes EXAM: PORTABLE CHEST 1 VIEW COMPARISON:  PA and lateral chest x-ray of September 03, 2016 FINDINGS: The lungs are reasonably well inflated. The interstitial markings have improved considerably. The cardiac silhouette remains mildly enlarged. The pulmonary vascularity is less engorged. The left hemidiaphragm is partially obscured. There is calcification in the wall of the aortic arch. The bony thorax is unremarkable. IMPRESSION: Interval improvement in pulmonary interstitial edema. Persistent mild left basilar subsegmental atelectasis and small left pleural effusion. Thoracic aortic atherosclerosis. Electronically Signed   By: David  Martinique M.D.   On: 09/04/2016 07:23     LOS: 1 day   Oren Binet, MD  [facility] Pager:[phone]  If 7PM-7AM, please contact night-coverage [email or web address] Password [facility] 09/04/2016, 12:38 PM

## 2016-09-05 ENCOUNTER — Inpatient Hospital Stay (HOSPITAL_COMMUNITY): Payer: Medicare Other

## 2016-09-05 DIAGNOSIS — I5031 Acute diastolic (congestive) heart failure: Secondary | ICD-10-CM

## 2016-09-05 DIAGNOSIS — N179 Acute kidney failure, unspecified: Secondary | ICD-10-CM

## 2016-09-05 DIAGNOSIS — I10 Essential (primary) hypertension: Secondary | ICD-10-CM

## 2016-09-05 DIAGNOSIS — R609 Edema, unspecified: Secondary | ICD-10-CM

## 2016-09-05 LAB — BASIC METABOLIC PANEL
ANION GAP: 10 (ref 5–15)
BUN: 31 mg/dL — ABNORMAL HIGH (ref 6–20)
CALCIUM: 8.7 mg/dL — AB (ref 8.9–10.3)
CO2: 31 mmol/L (ref 22–32)
Chloride: 98 mmol/L — ABNORMAL LOW (ref 101–111)
Creatinine, Ser: 2.06 mg/dL — ABNORMAL HIGH (ref 0.61–1.24)
GFR, EST AFRICAN AMERICAN: 36 mL/min — AB (ref >60)
GFR, EST NON AFRICAN AMERICAN: 31 mL/min — AB (ref >60)
Glucose, Bld: 42 mg/dL — CL (ref 65–99)
Potassium: 3.6 mmol/L (ref 3.5–5.1)
Sodium: 139 mmol/L (ref 135–145)

## 2016-09-05 LAB — GLUCOSE, CAPILLARY
GLUCOSE-CAPILLARY: 83 mg/dL (ref 65–99)
Glucose-Capillary: 75 mg/dL (ref 65–99)
Glucose-Capillary: 125 mg/dL — ABNORMAL HIGH (ref 65–99)
Glucose-Capillary: 211 mg/dL — ABNORMAL HIGH (ref 65–99)
Glucose-Capillary: 151 mg/dL — ABNORMAL HIGH (ref 65–99)

## 2016-09-05 MED ORDER — INSULIN DETEMIR 100 UNIT/ML ~~LOC~~ SOLN
50.0000 [IU] | Freq: Every day | SUBCUTANEOUS | Status: DC
  Administered 2016-09-05: 50 [IU] via SUBCUTANEOUS
  Filled 2016-09-05 (×2): qty 0.5

## 2016-09-05 MED ORDER — LISINOPRIL 10 MG PO TABS: 10.0000 mg | ORAL_TABLET | Freq: Every day | ORAL | Status: DC

## 2016-09-05 MED ORDER — FUROSEMIDE 10 MG/ML IJ SOLN
40.0000 mg | Freq: Every day | INTRAMUSCULAR | Status: DC
  Administered 2016-09-06: 40 mg via INTRAVENOUS
  Filled 2016-09-05: qty 4

## 2016-09-05 NOTE — Progress Notes (Signed)
**  Preliminary report by tech**  Bilateral lower extremity venous duplex completed. There is no evidence of deep or superficial vein thrombosis involving the right and left lower extremities. All visualized vessels appear patent and compressible. There is no evidence of Baker's cysts bilaterally. Results were given to the patient's nurse, Nellie.  09/05/16 10:21 AM David [name] RVT

## 2016-09-05 NOTE — Care Management Note (Signed)
Case Management Note  Patient Details  Name: David [name] MRN: [id number] Date of Birth: December 09, 1945  Subjective/Objective:    Admitted with Shortness of Breath               Action/Plan: Patient lives at home with his spouse, PCP: Eloise Levels, NP; has private insurance with [facility] with prescription drug coverage; [facility] of choice [facility]; pt reports no problem getting his medication; he has scales at home and his wife cooks a heart healthy diet; Patient is refusing home health therapy at this time, stated " I don't need that." Patient possibly need home oxygen at discharge; CM will continue to follow for DCP.  Expected Discharge Date:  09/06/16               Expected Discharge Plan:  Home/Self Care  Discharge planning Services  CM Consult  Choice offered to:  Patient  HH Arranged:  Patient Refused HH   Status of Service:  In process, will continue to follow  Sherrilyn Rist [phone] 09/05/2016, 3:16 PM

## 2016-09-05 NOTE — Progress Notes (Signed)
Subjective:  Feels much improved and ambulated in hallway, but still dyspneic. Slept better.  Objective:  Vital Signs in the last 24 hours: Temp:  [98.1 F (36.7 C)-99 F (37.2 C)] 98.7 F (37.1 C) (04/20 0634) Pulse Rate:  [57-69] 57 (04/20 1008) Resp:  [18] 18 (04/20 0634) BP: (111-147)/(54-83) 120/54 (04/20 1006) SpO2:  [97 %-100 %] 100 % (04/20 0634) Weight:  [101.7 kg (224 lb 3.2 oz)] 101.7 kg (224 lb 3.2 oz) (04/20 0634)  Intake/Output from previous day: 04/19 0701 - 04/20 0700 In: 480 [P.O.:480] Out: 1250 [Urine:1250]  Physical Exam: Blood pressure (!) 120/54, pulse (!) 57, temperature 98.7 F (37.1 C), temperature source Oral, resp. rate 18, height 6\' 2"  (1.88 m), weight 101.7 kg (224 lb 3.2 oz), SpO2 100 %.  General appearance: alert, cooperative, appears stated age, mild distress and mildly obese Lungs: Bilateral basilar crackles at the bases posteriorly, improved from yesterday. Heart: S1, S2 normal, S4 present and systolic murmur: early systolic 2/6, blowing at 2nd left intercostal space, at lower left sternal border Abdomen: soft, non-tender; bowel sounds normal; no masses,  no organomegaly Extremities: No leg edema. Skin is warm. No signs of acute arterial insufficiency. Right greater toe amputation site is healed well. Pulses: 2+ and symmetric Neurologic: Grossly normal  Lab Results: BMP  Recent Labs  09/03/16 1802 09/04/16 0119 09/05/16 0324  NA 138 135 139  K 3.8 3.6 3.6  CL 100* 99* 98*  CO2 25 26 31   GLUCOSE 227* 328* 42*  BUN 18 18 31*  CREATININE 1.27* 1.27* 2.06*  CALCIUM 9.2 8.6* 8.7*  GFRNONAA 56* 56* 31*  GFRAA >60 >60 36*    CBC  Recent Labs Lab 09/04/16 0700  WBC 10.7*  RBC 2.85*  HGB 9.4*  HCT 28.9*  PLT 378  MCV 101.4*  MCH 33.0  MCHC 32.5  RDW 12.4    HEMOGLOBIN A1C Lab Results  Component Value Date   HGBA1C 7.7 (H) 08/14/2013   MPG 174 (H) 08/14/2013    Cardiac Panel (last 3 results)  Recent Labs   09/04/16 0119 09/04/16 0700 09/04/16 1236  TROPONINI 0.05* 0.06* 0.05*   TSH  Recent Labs  09/04/16 0119  TSH 1.411   Hepatic Function Panel  Recent Labs  09/03/16 2255  PROT 7.9  ALBUMIN 3.4*  AST 23  ALT 42  ALKPHOS 191*  BILITOT 1.0  BILIDIR <0.1*  IBILI NOT CALCULATED   Imaging: Imaging results have been reviewed  Cardiac Studies:  EKG 09/04/2016: Sinus rhythm at the rate of 86 bpm with first-degree AV block, left atrial enlargement, LVH. PVC. Nonspecific T abnormality.  Echo:   09/04/2016: Left ventricle: The cavity size was normal. Systolic function was mildly to moderately reduced. The estimated ejection fraction wasin the range of 40% to 45%. Diffuse hypokinesis. Left ventricular diastolic function parameters were normal. - Mitral valve: There was mild regurgitation. - Left atrium: The atrium was mildly dilated. - The right ventricular systolic pressure wasincreased consistent with severe pulmonary hypertension. PAsystolic pressure 66 mm Hg.  Scheduled Meds: . amLODipine  10 mg Oral Daily  . aspirin EC  81 mg Oral Daily  . atorvastatin  40 mg Oral QHS  . docusate sodium  100 mg Oral Daily  . enoxaparin (LOVENOX) injection  40 mg Subcutaneous Q24H  . gabapentin  600 mg Oral BID  . insulin aspart  0-5 Units Subcutaneous QHS  . insulin aspart  0-9 Units Subcutaneous TID WC  . insulin detemir  50  Units Subcutaneous Daily  . isosorbide-hydrALAZINE  1 tablet Oral TID  . latanoprost  1 drop Both Eyes QHS  . metoprolol tartrate  25 mg Oral BID  . sodium chloride flush  3 mL Intravenous Q12H   Continuous Infusions: PRN Meds:.acetaminophen **OR** acetaminophen, ALPRAZolam, hydrALAZINE, HYDROcodone-acetaminophen  Assessment/Plan:  1. Acute on chronic systolic and diastolic heart failure, LVEF reduced mild to moderately at 40-45% with global hypokinesis. Elevated serum troponin due to CHF. Severe pulmonary hypertension due to LV failure.  2. Diabetes  mellitus type 2 uncontrolled 3. Hypertension with hypertensive heart disease, S4 gallop. 4. Diabetic peripheral neuropathy 5. Hyperlipidemia  6. Anemia, macrocytic 7. Acute on chronic renal failure due to cardio-renal syndrome and aggressive diuresis.   Recommendation: Continue IV diuresis today, switch to p.o. tomorrow.  Hold off on ACE inhibitor due to acute renal failure.  Ambulate as tolerated.  Once his serum creatinine trends down, he can be discharged from with outpatient follow-up.  I will continue BiDil to 1 tablets t.i.d. for better blood pressure control. Leg edema has resolved.   I do not think he needs invasive workup at this point although his EF is down and he has severe pulmonary hypertension, this is due to acute decompensated heart failure.  I would like to wait till is acute failure is resolved, renal function improves and then consider either left and right heart catheterization versus stress testing.  He appears to be very motivated in making lifestyle changes.  Would recommend drastically reducing the dose of insulin long-acting and he wants to continue short-acting and wants to monitor his blood sugars on a regular basis.  Would highly recommend a agent diabetes education again.  Adrian Prows, M.D. 09/05/2016, 10:30 AM [facility] Pager: [phone] Office: [phone] If no answer: [phone]

## 2016-09-05 NOTE — Progress Notes (Signed)
Hypoglycemic Event  CBG: 42  Treatment: 15 GM carbohydrate snack  Symptoms: None  Follow-up CBG: [id number] CBG Result:75  Possible Reasons for Event: inadequate meal intake  Comments/MD notified:protocol    Rolfe Hartsell L

## 2016-09-05 NOTE — Progress Notes (Signed)
Pt urine with clear amber color. Dr. Sonia Side notified.  No new order.  Will continue to monitor.  Karie Kirks, Therapist, sports. Urine clear.

## 2016-09-05 NOTE — Evaluation (Signed)
Occupational Therapy Evaluation Patient Details Name: David [name] MRN: [id number] DOB: [birth year] Today's Date: 09/05/2016    History of Present Illness Pt is a 71 yo male admitted through ED for increased dyspnea and swelling over the past 3 weeks. Per chart questioning myocarditis vs HF. PMH significant for HTN, Glaucoma, GERD, DM2, OA, Anxiety, JVD.      Clinical Impression   Pt walks with a quad cane and is independent in ADL and IADL. He is a retired Microbiologist. Pt presents with impaired activity tolerance and standing balance. He has difficulty accessing his feet for bathing and dressing. Educated pt in use of AE for LB bathing and dressing, benefits of shower seat and energy conservation. Reinforced EC strategies with handout. Will follow acutely.    Follow Up Recommendations  No OT follow up    Equipment Recommendations  Tub/shower seat    Recommendations for Other Services       Precautions / Restrictions Precautions Precautions: Fall Precaution Comments: reports a fall s/p 1 month ago Restrictions Weight Bearing Restrictions: No      Mobility Bed Mobility Overal bed mobility: Modified Independent             General bed mobility comments: up sitting EOB  Transfers Overall transfer level: Needs assistance Equipment used: Rolling walker (2 wheeled) Transfers: Sit to/from Stand Sit to Stand: Min guard         General transfer comment: min guard for safety from EOB to RW    Balance Overall balance assessment: Needs assistance Sitting-balance support: No upper extremity supported;Feet supported Sitting balance-Leahy Scale: Good       Standing balance-Leahy Scale: Fair                             ADL either performed or assessed with clinical judgement   ADL Overall ADL's : Needs assistance/impaired Eating/Feeding: Independent;Sitting   Grooming: Min guard;Standing;Wash/dry hands   Upper Body Bathing: Set  up;Sitting   Lower Body Bathing: Minimal assistance;Sit to/from stand   Upper Body Dressing : Set up;Sitting   Lower Body Dressing: Minimal assistance;Sit to/from stand   Toilet Transfer: Min guard;Ambulation;RW   Toileting- Water quality scientist and Hygiene: Min guard;Sit to/from stand       Functional mobility during ADLs: Min guard;Rolling walker General ADL Comments: Pt educated in energy conservation. He is familiar with AE and tub transfer bench from prior THA. Recommended compression hose donner.     Vision Baseline Vision/History: Wears glasses Wears Glasses: Reading only       Perception     Praxis      Pertinent Vitals/Pain Pain Assessment: No/denies pain     Hand Dominance Right   Extremity/Trunk Assessment Upper Extremity Assessment Upper Extremity Assessment: Overall WFL for tasks assessed   Lower Extremity Assessment Lower Extremity Assessment: Defer to PT evaluation   Cervical / Trunk Assessment Cervical / Trunk Assessment: Other exceptions (chronic back pain)   Communication Communication Communication: No difficulties   Cognition Arousal/Alertness: Awake/alert Behavior During Therapy: WFL for tasks assessed/performed Overall Cognitive Status: Within Functional Limits for tasks assessed                                     General Comments  SpO2 dropped amb on RA to 83%, maintained on 2L O2 during rest of ambulation wtih SpO2 94%  Exercises     Shoulder Instructions      Home Living Family/patient expects to be discharged to:: Private residence Living Arrangements: Spouse/significant other Available Help at Discharge: Family Type of Home: House Home Access: Stairs to enter Technical brewer of Steps: 3 Entrance Stairs-Rails: Right;Left Home Layout: Two level;Bed/bath upstairs Alternate Level Stairs-Number of Steps: 12 Alternate Level Stairs-Rails: Right Bathroom Shower/Tub: Teacher, early years/pre:  Standard     Home Equipment: Environmental consultant - 2 wheels;Cane - quad;Adaptive equipment Adaptive Equipment: Reacher        Prior Functioning/Environment Level of Independence: Independent with assistive device(s)        Comments: walks with a quad cane, otherwise independent        OT Problem List: Decreased activity tolerance;Impaired balance (sitting and/or standing);Decreased knowledge of use of DME or AE;Cardiopulmonary status limiting activity      OT Treatment/Interventions: Self-care/ADL training;Patient/family education;DME and/or AE instruction;Energy conservation    OT Goals(Current goals can be found in the care plan section) Acute Rehab OT Goals Patient Stated Goal: to get home OT Goal Formulation: With patient Time For Goal Achievement: 09/12/16 Potential to Achieve Goals: Good ADL Goals Additional ADL Goal #1: Pt will state at least 5 energy conservation strategies as instructed.  OT Frequency: Min 2X/week   Barriers to D/C:            Co-evaluation              End of Session Equipment Utilized During Treatment: Gait belt;Rolling walker;Oxygen  Activity Tolerance: Patient tolerated treatment well Patient left: in bed;with call bell/phone within reach;with family/visitor present  OT Visit Diagnosis: Unsteadiness on feet (R26.81);History of falling (Z91.81) (decreased activity tolerance)                Time: 1411-1440 OT Time Calculation (min): 29 min Charges:  OT General Charges $OT Visit: 1 Procedure OT Evaluation $OT Eval Moderate Complexity: 1 Procedure OT Treatments $Self Care/Home Management : 8-22 mins G-Codes:     Malka So 09/05/2016, 2:57 PM  [phone]

## 2016-09-05 NOTE — Progress Notes (Signed)
PROGRESS NOTE        PATIENT DETAILS Name: David [name] Age: 71 y.o. Sex: male Date of Birth: [birth year] Admit Date: 09/03/2016 Admitting Physician Sid Falcon, MD DEY:CXKGYJEH Brown, NP  Brief Narrative: Patient is a 71 y.o. male male with history of diabetes, hypertension, dyslipidemia, recent right great toe amputation (07/09/16) admitted with worsening exertional dyspnea, lower extremity edema, thought to have new onset CHF and admitted to the hospitalist service. See below for further details  Subjective: Improved-still not back to his baseline but seems to be ambulating a little bit more. Lower extremity edema has resolved. No chest pain.   Assessment/Plan: Acute on chronic systolic and diastolic heart failure (EF 40-45% on echo on 09/04/16): Volume status slowly improving-lower extremity edema has resolved, but still has some JVD and rales on exam. Creatinine has increased-we will decrease Lasix to 40 mg daily-hold lisinopril today. Cardiology planning on invasive workup, when patient's volume status has improved.  Acute kidney injury on chronic kidney disease stage III: Suspect hemodynamically mediated kidney injury-due to improvement in blood pressure, ACE inhibitor, diuretic use. Does have some proteinuria-probably has underlying diabetic nephropathy.  Hypertension: Significantly improved-continue amlodipine, and metoprolol.  Insulin-dependent DM-2: Hypoglycemic episode this morning-this is likely secondary to dramatically low oral intake yesterday after he spoke with cardiologist who had recommended that he be more compliant with diet. Per patient's wife-he hardly had any oral intake yesterday. Reduce Levemir to 50 units a more cautiously continue with SSI. Follow CBGs. Counseled that he needs to eat while he is on insulin-but he needs to be compliant with diabetic/heart healthy diet. Will order a nutrition consult.  Severe pulmonary hypertension:  Seen on echocardiogram-discussed with cardiology-Dr Ganji-thinks this is due to left heart failure. Lower extremity Doppler negative. Cardiology planning on right heart catheterization once more stable Retrospectively does acknowledge exertional dyspnea-up on walking to the grocery store, and even while mowing his lawn.   Dyslipidemia: Continue statin.  Peripheral neuropathy: Probably related to diabetes, continue Neurontin.  Anemia: Probably related to underlying chronic kidney disease. Stable for outpatient workup.  Anxiety: Continue Xanax  Recent right great toe amputation: Stump site looks clean.  Chronic pain syndrome: Has chronic back pain-claims has had multiple back surgeries-continue home regimen of narcotics.  DVT Prophylaxis: Prophylactic Lovenox   Code Status: Full code  Family Communication: Spouse at bedside  Disposition Plan: Remain inpatient-home in the next few days  Antimicrobial agents: Anti-infectives    None     Procedures: None  CONSULTS:  cardiology  Time spent: 25- minutes-Greater than 50% of this time was spent in counseling, explanation of diagnosis, planning of further management, and coordination of care.  MEDICATIONS: Scheduled Meds: . amLODipine  10 mg Oral Daily  . aspirin EC  81 mg Oral Daily  . atorvastatin  40 mg Oral QHS  . docusate sodium  100 mg Oral Daily  . enoxaparin (LOVENOX) injection  40 mg Subcutaneous Q24H  . gabapentin  600 mg Oral BID  . insulin aspart  0-5 Units Subcutaneous QHS  . insulin aspart  0-9 Units Subcutaneous TID WC  . insulin detemir  50 Units Subcutaneous Daily  . isosorbide-hydrALAZINE  1 tablet Oral TID  . latanoprost  1 drop Both Eyes QHS  . metoprolol tartrate  25 mg Oral BID  . sodium chloride flush  3 mL Intravenous Q12H  Continuous Infusions: PRN Meds:.acetaminophen **OR** acetaminophen, ALPRAZolam, hydrALAZINE, HYDROcodone-acetaminophen   PHYSICAL EXAM: Vital signs: Vitals:   09/04/16  2357 09/05/16 0634 09/05/16 1006 09/05/16 1008  BP: 111/62 132/83 (!) 120/54   Pulse: 60 60  (!) 57  Resp: 18 18    Temp: 98.6 F (37 C) 98.7 F (37.1 C)    TempSrc: Oral Oral    SpO2: 100% 100%    Weight:  101.7 kg (224 lb 3.2 oz)    Height:       Filed Weights   09/03/16 1751 09/04/16 0035 09/05/16 0634  Weight: 106.6 kg (235 lb) 101.5 kg (223 lb 11.2 oz) 101.7 kg (224 lb 3.2 oz)   Body mass index is 28.79 kg/m.   General appearance :Awake, alert, not in any distress. Speech Clear. Eyes:, pupils equally reactive to light and accomodation,no scleral icterus. HEENT: Atraumatic and Normocephalic Neck: supple, no JVD. No cervical lymphadenopathy.  Resp:Good air entry bilaterally, few bibasilar rales CVS: S1 S2 regular, no murmurs.  GI: Bowel sounds present, Non tender and not distended with no gaurding, rigidity or rebound. Extremities: B/L Lower Ext shows no edema, both legs are warm to touch. Right great toe stump site clean. Neurology:  speech clear,Non focal, sensation is grossly intact. Psychiatric: Normal judgment and insight. Alert and oriented x 3. Normal mood. Musculoskeletal:No digital cyanosis Skin:No Rash, warm and dry Wounds:N/A  I have personally reviewed following labs and imaging studies  LABORATORY DATA: CBC:  Recent Labs Lab 09/03/16 1802 09/04/16 0119 09/04/16 0700  WBC 14.4* 12.0* 10.7*  HGB 10.3* 9.4* 9.4*  HCT 31.7* 29.4* 28.9*  MCV 101.6* 101.4* 101.4*  PLT 425* 376 382    Basic Metabolic Panel:  Recent Labs Lab 09/03/16 1802 09/04/16 0119 09/05/16 0324  NA 138 135 139  K 3.8 3.6 3.6  CL 100* 99* 98*  CO2 25 26 31   GLUCOSE 227* 328* 42*  BUN 18 18 31*  CREATININE 1.27* 1.27* 2.06*  CALCIUM 9.2 8.6* 8.7*    GFR: Estimated Creatinine Clearance: 43.1 mL/min (A) (by C-G formula based on SCr of 2.06 mg/dL (H)).  Liver Function Tests:  Recent Labs Lab 09/03/16 2255  AST 23  ALT 42  ALKPHOS 191*  BILITOT 1.0  PROT 7.9    ALBUMIN 3.4*   No results for input(s): LIPASE, AMYLASE in the last 168 hours. No results for input(s): AMMONIA in the last 168 hours.  Coagulation Profile:  Recent Labs Lab 09/04/16 0700  INR 1.21    Cardiac Enzymes:  Recent Labs Lab 09/04/16 0119 09/04/16 0700 09/04/16 1236  TROPONINI 0.05* 0.06* 0.05*    BNP (last 3 results) No results for input(s): PROBNP in the last 8760 hours.  HbA1C: No results for input(s): HGBA1C in the last 72 hours.  CBG:  Recent Labs Lab 09/04/16 1624 09/04/16 2138 09/05/16 0548 09/05/16 0724 09/05/16 1138  GLUCAP 216* 82 75 125* 211*    Lipid Profile: No results for input(s): CHOL, HDL, LDLCALC, TRIG, CHOLHDL, LDLDIRECT in the last 72 hours.  Thyroid Function Tests:  Recent Labs  09/04/16 0119  TSH 1.411    Anemia Panel: No results for input(s): VITAMINB12, FOLATE, FERRITIN, TIBC, IRON, RETICCTPCT in the last 72 hours.  Urine analysis:    Component Value Date/Time   COLORURINE STRAW (A) 09/04/2016 1233   APPEARANCEUR CLEAR 09/04/2016 1233   LABSPEC 1.006 09/04/2016 1233   PHURINE 5.0 09/04/2016 1233   GLUCOSEU NEGATIVE 09/04/2016 1233   HGBUR SMALL (A) 09/04/2016 1233  BILIRUBINUR NEGATIVE 09/04/2016 1233   KETONESUR NEGATIVE 09/04/2016 1233   PROTEINUR 100 (A) 09/04/2016 1233   UROBILINOGEN 1.0 02/13/2015 1934   NITRITE NEGATIVE 09/04/2016 1233   LEUKOCYTESUR NEGATIVE 09/04/2016 1233    Sepsis Labs: Lactic Acid, Venous    Component Value Date/Time   LATICACIDVEN 1.43 02/13/2015 1958    MICROBIOLOGY: Recent Results (from the past 240 hour(s))  MRSA PCR Screening     Status: None   Collection Time: 09/04/16 12:48 AM  Result Value Ref Range Status   MRSA by PCR NEGATIVE NEGATIVE Final    Comment:        The GeneXpert MRSA Assay (FDA approved for NASAL specimens only), is one component of a comprehensive MRSA colonization surveillance program. It is not intended to diagnose MRSA infection nor to  guide or monitor treatment for MRSA infections.     RADIOLOGY STUDIES/RESULTS: Dg Chest 2 View  Result Date: 09/03/2016 CLINICAL DATA:  Shortness of breath cough and weakness today. EXAM: CHEST  2 VIEW COMPARISON:  09/14/2013 and prior chest radiographs FINDINGS: Cardiomegaly with mild interstitial pulmonary edema noted. Bilateral lower lung opacities probably represent edema/ atelectasis but superimposed infection is not entirely excluded. Small bilateral pleural effusions are noted. There is no evidence of pneumothorax. IMPRESSION: Cardiomegaly with mild interstitial pulmonary edema and small bilateral pleural effusions. Bilateral lower lung opacities likely representing edema/ atelectasis but superimposed infection is not entirely excluded. Correlate clinically. Electronically Signed   By: Margarette Canada M.D.   On: 09/03/2016 18:44   Dg Chest Port 1 View  Result Date: 09/04/2016 CLINICAL DATA:  Shortness of breath, hypertension, diabetes EXAM: PORTABLE CHEST 1 VIEW COMPARISON:  PA and lateral chest x-ray of September 03, 2016 FINDINGS: The lungs are reasonably well inflated. The interstitial markings have improved considerably. The cardiac silhouette remains mildly enlarged. The pulmonary vascularity is less engorged. The left hemidiaphragm is partially obscured. There is calcification in the wall of the aortic arch. The bony thorax is unremarkable. IMPRESSION: Interval improvement in pulmonary interstitial edema. Persistent mild left basilar subsegmental atelectasis and small left pleural effusion. Thoracic aortic atherosclerosis. Electronically Signed   By: David  Martinique M.D.   On: 09/04/2016 07:23   Dg Chest Port 1v Same Day  Result Date: 09/05/2016 CLINICAL DATA:  Shortness breath for several weeks. EXAM: PORTABLE CHEST 1 VIEW COMPARISON:  09/04/2016 FINDINGS: Low lung volumes again noted. Heart size is stable. Persistent opacity seen in the left lung base, without significant change. No new or  worsening areas of pulmonary opacity identified. IMPRESSION: No significant change in low lung volumes and left basilar opacity, which may be due to atelectasis, infiltrate, or pleural effusion. Electronically Signed   By: Earle Gell M.D.   On: 09/05/2016 08:11     LOS: 2 days   Oren Binet, MD  [facility] Pager:[phone]  If 7PM-7AM, please contact night-coverage [email or web address] Password [email or web address] 09/05/2016, 1:48 PM

## 2016-09-05 NOTE — Progress Notes (Signed)
Physical Therapy Treatment Patient Details Name: David [name] MRN: [id number] DOB: 1945/07/05 Today's Date: 09/05/2016    History of Present Illness Pt is a 71 yo male admitted through ED for increased dyspnea and swelling over the past 3 weeks. Per chart questioning myocarditis vs HF. PMH significant for HTN, Glaucoma, GERD, DM2, OA, Anxiety, JVD.       PT Comments    Patient progressing with ambulation distance and able to practice stairs this session.  Remains generally weak, but reported able to tolerate more while wearing O2 than without.  Feel continued skilled PT during acute stay indicated for further progression with stairs and endurance with ambulation as well as follow up HHPT.    Follow Up Recommendations  Home health PT;Supervision for mobility/OOB     Equipment Recommendations  Other (comment) (shower chair)    Recommendations for Other Services       Precautions / Restrictions Precautions Precautions: Fall Precaution Comments: reports a fall s/p 1 month ago    Mobility  Bed Mobility               General bed mobility comments: up sitting EOB  Transfers   Equipment used: Rolling walker (2 wheeled) Transfers: Sit to/from Stand Sit to Stand: Min guard            Ambulation/Gait Ambulation/Gait assistance: Supervision;Min guard Ambulation Distance (Feet): 110 Feet (x 2) Assistive device: Rolling walker (2 wheeled) Gait Pattern/deviations: Step-through pattern;Decreased stride length;Trunk flexed;Decreased weight shift to left     General Gait Details: chronically flexed posture from prior back surgeries, L LE with arthritic changes, uses walker safely, but assist due to general weakness   Stairs Stairs: Yes   Stair Management: One rail Left;Step to pattern;Sideways Number of Stairs: 4 General stair comments: cues and demonstration for technique, some trouble with getting feet on stair safely with descent  Wheelchair Mobility     Modified Rankin (Stroke Patients Only)       Balance Overall balance assessment: Needs assistance Sitting-balance support: No upper extremity supported;Feet supported Sitting balance-Leahy Scale: Good       Standing balance-Leahy Scale: Fair                              Cognition Arousal/Alertness: Awake/alert Behavior During Therapy: WFL for tasks assessed/performed Overall Cognitive Status: Within Functional Limits for tasks assessed                                        Exercises      General Comments General comments (skin integrity, edema, etc.): SpO2 dropped amb on RA to 83%, maintained on 2L O2 during rest of ambulation wtih SpO2 94%      Pertinent Vitals/Pain Pain Assessment: No/denies pain    Home Living                      Prior Function            PT Goals (current goals can now be found in the care plan section) Progress towards PT goals: Progressing toward goals    Frequency    Min 3X/week      PT Plan Current plan remains appropriate    Co-evaluation             End of Session Equipment Utilized During Treatment: Gait  belt Activity Tolerance: Patient limited by fatigue Patient left: in bed;with call bell/phone within reach;with family/visitor present   PT Visit Diagnosis: Muscle weakness (generalized) (M62.81);Difficulty in walking, not elsewhere classified (R26.2)     Time: 2694-8546 PT Time Calculation (min) (ACUTE ONLY): 34 min  Charges:  $Gait Training: 23-37 mins                    G CodesMagda Kiel, [place] 09/05/2016    Reginia Naas 09/05/2016, 1:50 PM

## 2016-09-05 NOTE — Progress Notes (Signed)
SATURATION QUALIFICATIONS: (This note is used to comply with regulatory documentation for home oxygen)  Patient Saturations on Room Air at Rest = 89%  Patient Saturations on Room Air while Ambulating = 83%  Patient Saturations on 2 Liters of oxygen while Ambulating = 93%  Please briefly explain why patient needs home oxygen: Hypoxia with ambulation on RA.  [place], [place] 09/05/2016

## 2016-09-06 DIAGNOSIS — I272 Pulmonary hypertension, unspecified: Secondary | ICD-10-CM

## 2016-09-06 LAB — BASIC METABOLIC PANEL
ANION GAP: 8 (ref 5–15)
Anion gap: 6 (ref 5–15)
BUN: 40 mg/dL — ABNORMAL HIGH (ref 6–20)
BUN: 40 mg/dL — ABNORMAL HIGH (ref 6–20)
CALCIUM: 8.1 mg/dL — AB (ref 8.9–10.3)
CALCIUM: 8.3 mg/dL — AB (ref 8.9–10.3)
CO2: 30 mmol/L (ref 22–32)
CO2: 31 mmol/L (ref 22–32)
CREATININE: 2.05 mg/dL — AB (ref 0.61–1.24)
Chloride: 98 mmol/L — ABNORMAL LOW (ref 101–111)
Chloride: 97 mmol/L — ABNORMAL LOW (ref 101–111)
Creatinine, Ser: 2.07 mg/dL — ABNORMAL HIGH (ref 0.61–1.24)
GFR calc non Af Amer: 31 mL/min — ABNORMAL LOW (ref >60)
GFR calc non Af Amer: 31 mL/min — ABNORMAL LOW (ref >60)
GFR, EST AFRICAN AMERICAN: 36 mL/min — AB (ref >60)
GFR, EST AFRICAN AMERICAN: 36 mL/min — AB (ref >60)
GLUCOSE: 141 mg/dL — AB (ref 65–99)
Glucose, Bld: 58 mg/dL — ABNORMAL LOW (ref 65–99)
POTASSIUM: 4.2 mmol/L (ref 3.5–5.1)
Potassium: 4.6 mmol/L (ref 3.5–5.1)
Sodium: 136 mmol/L (ref 135–145)
Sodium: 134 mmol/L — ABNORMAL LOW (ref 135–145)

## 2016-09-06 LAB — GLUCOSE, CAPILLARY
GLUCOSE-CAPILLARY: 100 mg/dL — AB (ref 65–99)
GLUCOSE-CAPILLARY: 142 mg/dL — AB (ref 65–99)
GLUCOSE-CAPILLARY: 150 mg/dL — AB (ref 65–99)
Glucose-Capillary: 47 mg/dL — ABNORMAL LOW (ref 65–99)
Glucose-Capillary: 162 mg/dL — ABNORMAL HIGH (ref 65–99)

## 2016-09-06 MED ORDER — INSULIN DETEMIR 100 UNIT/ML ~~LOC~~ SOLN
42.0000 [IU] | Freq: Every day | SUBCUTANEOUS | Status: DC
  Filled 2016-09-06: qty 0.42

## 2016-09-06 MED ORDER — INSULIN DETEMIR 100 UNIT/ML ~~LOC~~ SOLN
32.0000 [IU] | Freq: Every day | SUBCUTANEOUS | Status: DC
  Administered 2016-09-07 – 2016-09-08 (×2): 32 [IU] via SUBCUTANEOUS
  Filled 2016-09-06 (×2): qty 0.32

## 2016-09-06 MED ORDER — FUROSEMIDE 40 MG PO TABS
40.0000 mg | ORAL_TABLET | Freq: Two times a day (BID) | ORAL | Status: DC
  Administered 2016-09-06 – 2016-09-08 (×4): 40 mg via ORAL
  Filled 2016-09-06 (×4): qty 1

## 2016-09-06 NOTE — Progress Notes (Addendum)
PROGRESS NOTE        PATIENT DETAILS Name: David [name] Age: 71 y.o. Sex: male Date of Birth: [birth year] Admit Date: 09/03/2016 Admitting Physician Sid Falcon, MD NOB:SJGGEZMO Brown, NP  Brief Narrative: Patient is a 71 y.o. male male with history of diabetes, hypertension, dyslipidemia, recent right great toe amputation (07/09/16) admitted with worsening exertional dyspnea, lower extremity edema, thought to have new onset CHF and admitted to the hospitalist service. See below for further details  Subjective: Slowly improving-continues to have exertional dyspnea but that is resolving.   Assessment/Plan: Acute on chronic systolic and diastolic heart failure (EF 40-45% on echo on 09/04/16): Continues to have elevated volumes on exam-but still has lower extremity edema this morning. Cardiology following, continue Lasix, metoprolol and BiDil.   Acute kidney injury on chronic kidney disease stage III: Likely hemodynamically admitted acute kidney injury in a setting of significantly improved blood pressure, ACE inhibitor use, and diuretic use. Suspect has underlying nephrotic syndrome due to diabetes.   Hypertension: Currently improved-continue with the metoprolol and BiDil. Have discontinued amlodipine-can allow blood pressure to increase slightly to increase renal perfusion.  Insulin-dependent DM-2: Hypoglycemic given this morning. Probably secondary to a better diet compliance-he seems very motivated to change his dietary meds. Suspect will require less insulin-we will reduce Levemir to 32 units. Continue SSI.   Severe pulmonary hypertension: Seen on echo-per cardiology this is likely reflective of his left-sided heart failure. Cardiology planning on LHC/RNC when more stable. Lower extremity Doppler negative. Retrospectively does acknowledge exertional dyspnea-up on walking to the grocery store, and even while mowing his lawn.   Dyslipidemia: Continue  statin  Peripheral neuropathy: Continue Neurontin-related related to diabetes   Anemia: Likely related to underlying chronic diseas-hemoglobin stable, for outpatient workup.   Anxiety: Continue Xanax  Recent right great toe amputation: Stump site looks clean.  Chronic pain syndrome: Has chronic back pain-claims has had multiple back surgeries-continue home regimen of narcotics.  DVT Prophylaxis: Prophylactic Lovenox   Code Status: Full code  Family Communication: Spouse at bedside  Disposition Plan: Remain inpatient-home in the next few days  Antimicrobial agents: Anti-infectives    None     Procedures: None  CONSULTS:  cardiology  Time spent: 25- minutes-Greater than 50% of this time was spent in counseling, explanation of diagnosis, planning of further management, and coordination of care.  MEDICATIONS: Scheduled Meds: . aspirin EC  81 mg Oral Daily  . atorvastatin  40 mg Oral QHS  . docusate sodium  100 mg Oral Daily  . enoxaparin (LOVENOX) injection  40 mg Subcutaneous Q24H  . furosemide  40 mg Oral BID  . gabapentin  600 mg Oral BID  . insulin aspart  0-5 Units Subcutaneous QHS  . insulin aspart  0-9 Units Subcutaneous TID WC  . insulin detemir  42 Units Subcutaneous Daily  . isosorbide-hydrALAZINE  1 tablet Oral TID  . latanoprost  1 drop Both Eyes QHS  . metoprolol tartrate  25 mg Oral BID  . sodium chloride flush  3 mL Intravenous Q12H   Continuous Infusions: PRN Meds:.acetaminophen **OR** acetaminophen, ALPRAZolam, hydrALAZINE, HYDROcodone-acetaminophen   PHYSICAL EXAM: Vital signs: Vitals:   09/05/16 1623 09/05/16 2120 09/06/16 0538 09/06/16 1300  BP: (!) 109/58 (!) 102/58 127/72 109/65  Pulse: 61 61 65 69  Resp: 20 20 20 20   Temp:  98.4 F (  36.9 C) 97.5 F (36.4 C) 98.2 F (36.8 C)  TempSrc:  Oral Oral Oral  SpO2: 100% 100% 97% 100%  Weight:   103.6 kg (228 lb 8 oz)   Height:       Filed Weights   09/04/16 0035 09/05/16 0634  09/06/16 0538  Weight: 101.5 kg (223 lb 11.2 oz) 101.7 kg (224 lb 3.2 oz) 103.6 kg (228 lb 8 oz)   Body mass index is 29.34 kg/m.  General appearance :Awake, alert, not in any distress. Eyes:, pupils equally reactive to light and accomodation,no scleral icterus HEENT: Atraumatic and Normocephalic Neck: supple, no JVD. No cervical lymphadenopathy. No thyromegaly Resp:Good air entry bilaterally, no added sounds  CVS: S1 S2 regular, no murmurs.  GI: Bowel sounds present, Non tender and not distended with no gaurding, rigidity or rebound. Extremities: B/L Lower Ext shows + edema, both legs are warm to touch Neurology:  speech clear,Non focal, sensation is grossly intact. Psychiatric: Normal judgment and insight. Alert and oriented x 3. Normal mood. Musculoskeletal:No digital cyanosis Skin:No Rash, warm and dry Wounds:N/A  I have personally reviewed following labs and imaging studies  LABORATORY DATA: CBC:  Recent Labs Lab 09/03/16 1802 09/04/16 0119 09/04/16 0700  WBC 14.4* 12.0* 10.7*  HGB 10.3* 9.4* 9.4*  HCT 31.7* 29.4* 28.9*  MCV 101.6* 101.4* 101.4*  PLT 425* 376 540    Basic Metabolic Panel:  Recent Labs Lab 09/03/16 1802 09/04/16 0119 09/05/16 0324 09/06/16 0523 09/06/16 1229  NA 138 135 139 136 134*  K 3.8 3.6 3.6 4.2 4.6  CL 100* 99* 98* 98* 97*  CO2 25 26 31 30 31   GLUCOSE 227* 328* 42* 58* 141*  BUN 18 18 31* 40* 40*  CREATININE 1.27* 1.27* 2.06* 2.07* 2.05*  CALCIUM 9.2 8.6* 8.7* 8.1* 8.3*    GFR: Estimated Creatinine Clearance: 43.7 mL/min (A) (by C-G formula based on SCr of 2.05 mg/dL (H)).  Liver Function Tests:  Recent Labs Lab 09/03/16 2255  AST 23  ALT 42  ALKPHOS 191*  BILITOT 1.0  PROT 7.9  ALBUMIN 3.4*   No results for input(s): LIPASE, AMYLASE in the last 168 hours. No results for input(s): AMMONIA in the last 168 hours.  Coagulation Profile:  Recent Labs Lab 09/04/16 0700  INR 1.21    Cardiac Enzymes:  Recent  Labs Lab 09/04/16 0119 09/04/16 0700 09/04/16 1236  TROPONINI 0.05* 0.06* 0.05*    BNP (last 3 results) No results for input(s): PROBNP in the last 8760 hours.  HbA1C: No results for input(s): HGBA1C in the last 72 hours.  CBG:  Recent Labs Lab 09/05/16 1650 09/05/16 2127 09/06/16 0808 09/06/16 0910 09/06/16 1211  GLUCAP 151* 83 47* 100* 162*    Lipid Profile: No results for input(s): CHOL, HDL, LDLCALC, TRIG, CHOLHDL, LDLDIRECT in the last 72 hours.  Thyroid Function Tests:  Recent Labs  09/04/16 0119  TSH 1.411    Anemia Panel: No results for input(s): VITAMINB12, FOLATE, FERRITIN, TIBC, IRON, RETICCTPCT in the last 72 hours.  Urine analysis:    Component Value Date/Time   COLORURINE STRAW (A) 09/04/2016 1233   APPEARANCEUR CLEAR 09/04/2016 1233   LABSPEC 1.006 09/04/2016 1233   PHURINE 5.0 09/04/2016 1233   GLUCOSEU NEGATIVE 09/04/2016 1233   HGBUR SMALL (A) 09/04/2016 1233   BILIRUBINUR NEGATIVE 09/04/2016 1233   KETONESUR NEGATIVE 09/04/2016 1233   PROTEINUR 100 (A) 09/04/2016 1233   UROBILINOGEN 1.0 02/13/2015 1934   NITRITE NEGATIVE 09/04/2016 1233   LEUKOCYTESUR  NEGATIVE 09/04/2016 1233    Sepsis Labs: Lactic Acid, Venous    Component Value Date/Time   LATICACIDVEN 1.43 02/13/2015 1958    MICROBIOLOGY: Recent Results (from the past 240 hour(s))  MRSA PCR Screening     Status: None   Collection Time: 09/04/16 12:48 AM  Result Value Ref Range Status   MRSA by PCR NEGATIVE NEGATIVE Final    Comment:        The GeneXpert MRSA Assay (FDA approved for NASAL specimens only), is one component of a comprehensive MRSA colonization surveillance program. It is not intended to diagnose MRSA infection nor to guide or monitor treatment for MRSA infections.     RADIOLOGY STUDIES/RESULTS: Dg Chest 2 View  Result Date: 09/03/2016 CLINICAL DATA:  Shortness of breath cough and weakness today. EXAM: CHEST  2 VIEW COMPARISON:  09/14/2013 and  prior chest radiographs FINDINGS: Cardiomegaly with mild interstitial pulmonary edema noted. Bilateral lower lung opacities probably represent edema/ atelectasis but superimposed infection is not entirely excluded. Small bilateral pleural effusions are noted. There is no evidence of pneumothorax. IMPRESSION: Cardiomegaly with mild interstitial pulmonary edema and small bilateral pleural effusions. Bilateral lower lung opacities likely representing edema/ atelectasis but superimposed infection is not entirely excluded. Correlate clinically. Electronically Signed   By: Margarette Canada M.D.   On: 09/03/2016 18:44   Dg Chest Port 1 View  Result Date: 09/04/2016 CLINICAL DATA:  Shortness of breath, hypertension, diabetes EXAM: PORTABLE CHEST 1 VIEW COMPARISON:  PA and lateral chest x-ray of September 03, 2016 FINDINGS: The lungs are reasonably well inflated. The interstitial markings have improved considerably. The cardiac silhouette remains mildly enlarged. The pulmonary vascularity is less engorged. The left hemidiaphragm is partially obscured. There is calcification in the wall of the aortic arch. The bony thorax is unremarkable. IMPRESSION: Interval improvement in pulmonary interstitial edema. Persistent mild left basilar subsegmental atelectasis and small left pleural effusion. Thoracic aortic atherosclerosis. Electronically Signed   By: David  Martinique M.D.   On: 09/04/2016 07:23   Dg Chest Port 1v Same Day  Result Date: 09/05/2016 CLINICAL DATA:  Shortness breath for several weeks. EXAM: PORTABLE CHEST 1 VIEW COMPARISON:  09/04/2016 FINDINGS: Low lung volumes again noted. Heart size is stable. Persistent opacity seen in the left lung base, without significant change. No new or worsening areas of pulmonary opacity identified. IMPRESSION: No significant change in low lung volumes and left basilar opacity, which may be due to atelectasis, infiltrate, or pleural effusion. Electronically Signed   By: Earle Gell M.D.    On: 09/05/2016 08:11     LOS: 3 days   Oren Binet, MD  [facility] Pager:[phone]  If 7PM-7AM, please contact night-coverage [email or web address] Password [facility] 09/06/2016, 2:14 PM

## 2016-09-06 NOTE — Progress Notes (Signed)
Subjective:  Patient continues to report improvement in shortness of breath, does report slight cough. Plans to ambulate in hallway more today. Overall feels good. No complaints.   Objective:  Vital Signs in the last 24 hours: Temp:  [97.5 F (36.4 C)-98.4 F (36.9 C)] 97.5 F (36.4 C) (04/21 0538) Pulse Rate:  [52-65] 65 (04/21 0538) Resp:  [18-20] 20 (04/21 0538) BP: (98-127)/(54-72) 127/72 (04/21 0538) SpO2:  [97 %-100 %] 97 % (04/21 0538) Weight:  [103.6 kg (228 lb 8 oz)] 103.6 kg (228 lb 8 oz) (04/21 0538)  Intake/Output from previous day: 04/20 0701 - 04/21 0700 In: 480 [P.O.:480] Out: 750 [Urine:750]  Physical Exam:  General appearance: alert, cooperative, appears stated age, mild distress and mildly obese Lungs: Bilateral basilar crackles at the bases posteriorly, improved from yesterday. Heart: S1, S2 normal, S4 present and systolic murmur: early [id number], blowingat 2nd left intercostal space, at lower left sternal border Abdomen: soft, non-tender; bowel sounds normal; no masses, no organomegaly Extremities: Trace leg edema. Skin is warm. No signs of acute arterial insufficiency. Right greater toe amputation site is healed well. Pulses: 2+ and symmetric Neurologic: Grossly normal     Lab Results: BMP  Recent Labs  09/04/16 0119 09/05/16 0324 09/06/16 0523  NA 135 139 136  K 3.6 3.6 4.2  CL 99* 98* 98*  CO2 26 31 30   GLUCOSE 328* 42* 58*  BUN 18 31* 40*  CREATININE 1.27* 2.06* 2.07*  CALCIUM 8.6* 8.7* 8.1*  GFRNONAA 56* 31* 31*  GFRAA >60 36* 36*    CBC  Recent Labs Lab 09/04/16 0700  WBC 10.7*  RBC 2.85*  HGB 9.4*  HCT 28.9*  PLT 378  MCV 101.4*  MCH 33.0  MCHC 32.5  RDW 12.4    HEMOGLOBIN A1C Lab Results  Component Value Date   HGBA1C 7.7 (H) 08/14/2013   MPG 174 (H) 08/14/2013    Cardiac Panel (last 3 results)  Recent Labs  09/04/16 0119 09/04/16 0700 09/04/16 1236  TROPONINI 0.05* 0.06* 0.05*    BNP (last 3  results) No results for input(s): PROBNP in the last 8760 hours.  TSH  Recent Labs  09/04/16 0119  TSH 1.411    CHOLESTEROL No results for input(s): CHOL in the last 8760 hours.  Hepatic Function Panel  Recent Labs  09/03/16 2255  PROT 7.9  ALBUMIN 3.4*  AST 23  ALT 42  ALKPHOS 191*  BILITOT 1.0  BILIDIR <0.1*  IBILI NOT CALCULATED    Imaging: Imaging results have been reviewed   Cardiac Studies:  EKG 09/04/2016: Sinus rhythm at the rate of 86 bpm with first-degree AV block, left atrial enlargement, LVH. PVC. Nonspecific T abnormality.  Echo: 09/04/2016: Left ventricle: The cavity size was normal. Systolic function was mildly to moderately reduced. The estimated ejection fraction wasin the range of 40% to 45%. Diffuse hypokinesis. Left ventricular diastolic function parameters were normal. - Mitral valve: There was mild regurgitation. - Left atrium: The atrium was mildly dilated. - The right ventricular systolic pressure wasincreased consistent with severe pulmonary hypertension. PAsystolic pressure 66 mm Hg.  Assessment/Plan:  1. Acute on chronic systolic and diastolic heart failure, LVEF reduced mild to moderately at 40-45% with global hypokinesis. Elevated serum troponin due to CHF. Severe pulmonary hypertension due to LV failure.  2. Diabetes mellitus type 2 uncontrolled 3. Hypertension with hypertensive heart disease, S4 gallop. 4. Diabetic peripheral neuropathy 5. Hyperlipidemia  6. Anemia, macrocytic 7. Acute on chronic renal failure due to cardio-renal  syndrome and aggressive diuresis.   Recommendation: Continues to diurese well, will switch to PO lasix today. Continue to hold off on ACE inhibitor due to acute renal failure.  Ambulate as tolerated. No significant change in serum creatinine today. Once his serum creatinine trends down, he can be discharged from with outpatient follow-up.  I will continue BiDil to 1 tablets t.i.d. for better blood  pressure control. Does have slight leg edema today. Will continue to monitor.   I do not think he needs invasive workup at this point although his EF is down and he has severe pulmonary hypertension, this is due to acute decompensated heart failure. Will consider either left and right heart catheterization versus stress testing once acute heart failure and acute renal failure have resolved.  Lifestyle changes again discussed. Continued diabetes management per primary team.    Miquel Dunn, FNP-C. 09/06/2016, 9:55 AM [facility] Pager: [phone] Office: [phone] If no answer: [phone]

## 2016-09-07 LAB — GLUCOSE, CAPILLARY
GLUCOSE-CAPILLARY: 193 mg/dL — AB (ref 65–99)
GLUCOSE-CAPILLARY: 150 mg/dL — AB (ref 65–99)
Glucose-Capillary: 127 mg/dL — ABNORMAL HIGH (ref 65–99)
Glucose-Capillary: 125 mg/dL — ABNORMAL HIGH (ref 65–99)

## 2016-09-07 LAB — BASIC METABOLIC PANEL
Anion gap: 7 (ref 5–15)
BUN: 36 mg/dL — AB (ref 6–20)
CHLORIDE: 96 mmol/L — AB (ref 101–111)
CO2: 30 mmol/L (ref 22–32)
Calcium: 8.2 mg/dL — ABNORMAL LOW (ref 8.9–10.3)
Creatinine, Ser: 1.7 mg/dL — ABNORMAL HIGH (ref 0.61–1.24)
GFR calc Af Amer: 46 mL/min — ABNORMAL LOW (ref >60)
GFR calc non Af Amer: 39 mL/min — ABNORMAL LOW (ref >60)
Glucose, Bld: 193 mg/dL — ABNORMAL HIGH (ref 65–99)
POTASSIUM: 4.8 mmol/L (ref 3.5–5.1)
SODIUM: 133 mmol/L — AB (ref 135–145)

## 2016-09-07 MED ORDER — METHOCARBAMOL 500 MG PO TABS
750.0000 mg | ORAL_TABLET | Freq: Four times a day (QID) | ORAL | Status: DC | PRN
  Administered 2016-09-07: 750 mg via ORAL
  Filled 2016-09-07: qty 2

## 2016-09-07 MED ORDER — FLUTICASONE PROPIONATE 50 MCG/ACT NA SUSP
2.0000 | Freq: Every day | NASAL | Status: DC
  Administered 2016-09-07 – 2016-09-08 (×2): 2 via NASAL
  Filled 2016-09-07: qty 16

## 2016-09-07 NOTE — Progress Notes (Signed)
PROGRESS NOTE        PATIENT DETAILS Name: David [name] Age: 71 y.o. Sex: male Date of Birth: 1946/02/12 Admit Date: 09/03/2016 Admitting Physician Sid Falcon, MD WVP:[id number] Brown, NP  Brief Narrative: Patient is a 71 y.o. male male with history of diabetes, hypertension, dyslipidemia, recent right great toe amputation (07/09/16) admitted with worsening exertional dyspnea, lower extremity edema, thought to have new onset CHF and admitted to the hospitalist service. See below for further details  Subjective: Continues to claim he has exertional dyspnea  Assessment/Plan: Acute on chronic systolic and diastolic heart failure (EF 40-45% on echo on 09/04/16): Still remains with volume overload on exam-edema essentially unchanged, few bibasilar rales. More than 2.8 L diuresis yesterday. Continue Lasix and BiDil-if continues to have volume overload on exam tomorrow, may need to consider switching to IV Lasix.  Acute kidney injury on chronic kidney disease stage III: Suspect hemodynamically mediated kidney injury in the setting of better blood pressure control, ACE inhibitor use and diuretic use. Slowly improving with adjustment of medications. Likely has underlying diabetic nephropathy-given mild proteinuria in UA.   Hypertension: Controlled with metoprolol and BiDil.  Insulin-dependent DM-2: CBGs stable with no further episodes of hypoglycemia with 22 units of Lantus and SSI. Due to better compliance with diet-insulin requirements have come down markedly. Continue to follow CBGs and adjust dosing..   Severe pulmonary hypertension: Seen on echo-per cardiology this is likely due to left-sided heart failure. Cardiology planning on LHC/RNC when more stable. Lower extremity Doppler negative. Retrospectively does acknowledge exertional dyspnea-up on walking to the grocery store, and even while mowing his lawn.   Dyslipidemia: Continue statin  Peripheral neuropathy:  Continue Neurontin-appears stable.   Anemia: Likely related to underlying chronic diseas-hemoglobin stable, for outpatient workup.   Anxiety: Continue Xanax   Recent right great toe amputation: Stump site looks clean.  Chronic pain syndrome: Due to chronic back pain-has had multiple surgeries-continue home regimen of narcotics.  DVT Prophylaxis: Prophylactic Lovenox   Code Status: Full code  Family Communication: Spouse at bedside  Disposition Plan: Remain inpatient-home in the next few days  Antimicrobial agents: Anti-infectives    None     Procedures: None  CONSULTS:  cardiology  Time spent: 25- minutes-Greater than 50% of this time was spent in counseling, explanation of diagnosis, planning of further management, and coordination of care.  MEDICATIONS: Scheduled Meds: . aspirin EC  81 mg Oral Daily  . atorvastatin  40 mg Oral QHS  . docusate sodium  100 mg Oral Daily  . enoxaparin (LOVENOX) injection  40 mg Subcutaneous Q24H  . fluticasone  2 spray Each Nare Daily  . furosemide  40 mg Oral BID  . gabapentin  600 mg Oral BID  . insulin aspart  0-5 Units Subcutaneous QHS  . insulin aspart  0-9 Units Subcutaneous TID WC  . insulin detemir  32 Units Subcutaneous Daily  . isosorbide-hydrALAZINE  1 tablet Oral TID  . latanoprost  1 drop Both Eyes QHS  . metoprolol tartrate  25 mg Oral BID  . sodium chloride flush  3 mL Intravenous Q12H   Continuous Infusions: PRN Meds:.acetaminophen **OR** acetaminophen, ALPRAZolam, hydrALAZINE, HYDROcodone-acetaminophen, methocarbamol   PHYSICAL EXAM: Vital signs: Vitals:   09/06/16 1300 09/06/16 2013 09/07/16 0500 09/07/16 0920  BP: 109/65 (!) 112/53 124/69   Pulse: 69 67 78  Resp: 20 20 20    Temp: 98.2 F (36.8 C) 98.2 F (36.8 C) 99.8 F (37.7 C)   TempSrc: Oral Oral Oral   SpO2: 100% 100% 100% 97%  Weight:   103.8 kg (228 lb 12.8 oz)   Height:       Filed Weights   09/05/16 0634 09/06/16 0538 09/07/16 0500    Weight: 101.7 kg (224 lb 3.2 oz) 103.6 kg (228 lb 8 oz) 103.8 kg (228 lb 12.8 oz)   Body mass index is 29.38 kg/m.   General appearance :Awake, alert, not in any distress. Eyes:, pupils equally reactive to light and accomodation,no scleral icterus. HEENT: Atraumatic and Normocephalic Neck: supple, no JVD. No cervical lymphadenopathy. No thyromegaly Resp:Good air entry bilaterally, few bibasilar rales CVS: S1 S2 regular, no murmurs.  GI: Bowel sounds present, Non tender and not distended with no gaurding, rigidity or rebound.No organomegaly Extremities: B/L Lower Ext shows +edema, both legs are warm to touch Neurology:  speech clear,Non focal, sensation is grossly intact. Psychiatric: Normal judgment and insight. Musculoskeletal:No digital cyanosis Skin:No Rash, warm and dry Wounds:N/A  I have personally reviewed following labs and imaging studies  LABORATORY DATA: CBC:  Recent Labs Lab 09/03/16 1802 09/04/16 0119 09/04/16 0700  WBC 14.4* 12.0* 10.7*  HGB 10.3* 9.4* 9.4*  HCT 31.7* 29.4* 28.9*  MCV 101.6* 101.4* 101.4*  PLT 425* 376 086    Basic Metabolic Panel:  Recent Labs Lab 09/04/16 0119 09/05/16 0324 09/06/16 0523 09/06/16 1229 09/07/16 0557  NA 135 139 136 134* 133*  K 3.6 3.6 4.2 4.6 4.8  CL 99* 98* 98* 97* 96*  CO2 26 31 30 31 30   GLUCOSE 328* 42* 58* 141* 193*  BUN 18 31* 40* 40* 36*  CREATININE 1.27* 2.06* 2.07* 2.05* 1.70*  CALCIUM 8.6* 8.7* 8.1* 8.3* 8.2*    GFR: Estimated Creatinine Clearance: 52.7 mL/min (A) (by C-G formula based on SCr of 1.7 mg/dL (H)).  Liver Function Tests:  Recent Labs Lab 09/03/16 2255  AST 23  ALT 42  ALKPHOS 191*  BILITOT 1.0  PROT 7.9  ALBUMIN 3.4*   No results for input(s): LIPASE, AMYLASE in the last 168 hours. No results for input(s): AMMONIA in the last 168 hours.  Coagulation Profile:  Recent Labs Lab 09/04/16 0700  INR 1.21    Cardiac Enzymes:  Recent Labs Lab 09/04/16 0119  09/04/16 0700 09/04/16 1236  TROPONINI 0.05* 0.06* 0.05*    BNP (last 3 results) No results for input(s): PROBNP in the last 8760 hours.  HbA1C: No results for input(s): HGBA1C in the last 72 hours.  CBG:  Recent Labs Lab 09/06/16 1211 09/06/16 1707 09/06/16 2054 09/07/16 0727 09/07/16 1235  GLUCAP 162* 142* 150* 193* 150*    Lipid Profile: No results for input(s): CHOL, HDL, LDLCALC, TRIG, CHOLHDL, LDLDIRECT in the last 72 hours.  Thyroid Function Tests: No results for input(s): TSH, T4TOTAL, FREET4, T3FREE, THYROIDAB in the last 72 hours.  Anemia Panel: No results for input(s): VITAMINB12, FOLATE, FERRITIN, TIBC, IRON, RETICCTPCT in the last 72 hours.  Urine analysis:    Component Value Date/Time   COLORURINE STRAW (A) 09/04/2016 1233   APPEARANCEUR CLEAR 09/04/2016 1233   LABSPEC 1.006 09/04/2016 1233   PHURINE 5.0 09/04/2016 1233   GLUCOSEU NEGATIVE 09/04/2016 1233   HGBUR SMALL (A) 09/04/2016 1233   BILIRUBINUR NEGATIVE 09/04/2016 1233   KETONESUR NEGATIVE 09/04/2016 1233   PROTEINUR 100 (A) 09/04/2016 1233   UROBILINOGEN 1.0 02/13/2015 1934  NITRITE NEGATIVE 09/04/2016 1233   LEUKOCYTESUR NEGATIVE 09/04/2016 1233    Sepsis Labs: Lactic Acid, Venous    Component Value Date/Time   LATICACIDVEN 1.43 02/13/2015 1958    MICROBIOLOGY: Recent Results (from the past 240 hour(s))  MRSA PCR Screening     Status: None   Collection Time: 09/04/16 12:48 AM  Result Value Ref Range Status   MRSA by PCR NEGATIVE NEGATIVE Final    Comment:        The GeneXpert MRSA Assay (FDA approved for NASAL specimens only), is one component of a comprehensive MRSA colonization surveillance program. It is not intended to diagnose MRSA infection nor to guide or monitor treatment for MRSA infections.     RADIOLOGY STUDIES/RESULTS: Dg Chest 2 View  Result Date: 09/03/2016 CLINICAL DATA:  Shortness of breath cough and weakness today. EXAM: CHEST  2 VIEW COMPARISON:   09/14/2013 and prior chest radiographs FINDINGS: Cardiomegaly with mild interstitial pulmonary edema noted. Bilateral lower lung opacities probably represent edema/ atelectasis but superimposed infection is not entirely excluded. Small bilateral pleural effusions are noted. There is no evidence of pneumothorax. IMPRESSION: Cardiomegaly with mild interstitial pulmonary edema and small bilateral pleural effusions. Bilateral lower lung opacities likely representing edema/ atelectasis but superimposed infection is not entirely excluded. Correlate clinically. Electronically Signed   By: Margarette Canada M.D.   On: 09/03/2016 18:44   Dg Chest Port 1 View  Result Date: 09/04/2016 CLINICAL DATA:  Shortness of breath, hypertension, diabetes EXAM: PORTABLE CHEST 1 VIEW COMPARISON:  PA and lateral chest x-ray of September 03, 2016 FINDINGS: The lungs are reasonably well inflated. The interstitial markings have improved considerably. The cardiac silhouette remains mildly enlarged. The pulmonary vascularity is less engorged. The left hemidiaphragm is partially obscured. There is calcification in the wall of the aortic arch. The bony thorax is unremarkable. IMPRESSION: Interval improvement in pulmonary interstitial edema. Persistent mild left basilar subsegmental atelectasis and small left pleural effusion. Thoracic aortic atherosclerosis. Electronically Signed   By: David  Martinique M.D.   On: 09/04/2016 07:23   Dg Chest Port 1v Same Day  Result Date: 09/05/2016 CLINICAL DATA:  Shortness breath for several weeks. EXAM: PORTABLE CHEST 1 VIEW COMPARISON:  09/04/2016 FINDINGS: Low lung volumes again noted. Heart size is stable. Persistent opacity seen in the left lung base, without significant change. No new or worsening areas of pulmonary opacity identified. IMPRESSION: No significant change in low lung volumes and left basilar opacity, which may be due to atelectasis, infiltrate, or pleural effusion. Electronically Signed   By: Earle Gell M.D.   On: 09/05/2016 08:11     LOS: 4 days   Oren Binet, MD  [facility] Pager:[phone]  If 7PM-7AM, please contact night-coverage [email or web address] Password [facility] 09/07/2016, 2:27 PM

## 2016-09-07 NOTE — Progress Notes (Signed)
Subjective:  Patient presently doing well. Continues to report DOE that is improving slightly.   Objective:  Vital Signs in the last 24 hours: Temp:  [98.2 F (36.8 C)-99.8 F (37.7 C)] 99.8 F (37.7 C) (04/22 0500) Pulse Rate:  [67-78] 78 (04/22 0500) Resp:  [20] 20 (04/22 0500) BP: (109-124)/(53-69) 124/69 (04/22 0500) SpO2:  [97 %-100 %] 97 % (04/22 0920) Weight:  [103.8 kg (228 lb 12.8 oz)] 103.8 kg (228 lb 12.8 oz) (04/22 0500)  Intake/Output from previous day: 04/21 0701 - 04/22 0700 In: 1660 [P.O.:1660] Out: 2550 [Urine:2550]  Physical Exam:  General appearance: alert, cooperative, appears stated age, mild distress and mildly obese Lungs: Clear bilaterally. No rhonchi, wheezes, or crackles. Heart: S1, S2 normal, S4 present and systolic murmur: early [id number]/6, blowingat 2nd left intercostal space, at lower left sternal border Abdomen: soft, non-tender; bowel sounds normal; no masses, no organomegaly Extremities: Trace leg edema, slightly worse than yesterday. Skin is warm. No signs of acute arterial insufficiency. Right greater toe amputation site is healed well. Pulses: 2+ and symmetric Neurologic: Grossly normal    Lab Results: BMP  Recent Labs  09/06/16 0523 09/06/16 1229 09/07/16 0557  NA 136 134* 133*  K 4.2 4.6 4.8  CL 98* 97* 96*  CO2 30 31 30   GLUCOSE 58* 141* 193*  BUN 40* 40* 36*  CREATININE 2.07* 2.05* 1.70*  CALCIUM 8.1* 8.3* 8.2*  GFRNONAA 31* 31* 39*  GFRAA 36* 36* 46*    CBC  Recent Labs Lab 09/04/16 0700  WBC 10.7*  RBC 2.85*  HGB 9.4*  HCT 28.9*  PLT 378  MCV 101.4*  MCH 33.0  MCHC 32.5  RDW 12.4    HEMOGLOBIN A1C Lab Results  Component Value Date   HGBA1C 7.7 (H) 08/14/2013   MPG 174 (H) 08/14/2013    Cardiac Panel (last 3 results)  Recent Labs  09/04/16 0119 09/04/16 0700 09/04/16 1236  TROPONINI 0.05* 0.06* 0.05*    BNP (last 3 results) No results for input(s): PROBNP in the last 8760  hours.  TSH  Recent Labs  09/04/16 0119  TSH 1.411    CHOLESTEROL No results for input(s): CHOL in the last 8760 hours.  Hepatic Function Panel  Recent Labs  09/03/16 2255  PROT 7.9  ALBUMIN 3.4*  AST 23  ALT 42  ALKPHOS 191*  BILITOT 1.0  BILIDIR <0.1*  IBILI NOT CALCULATED    Imaging: Imaging studies reviewed.  Cardiac Studies:  Telemetry 09/07/2016: Sinus rhythm with first degree AV block. Unchanged from EKG 09/04/2016.  EKG 09/04/2016: Sinus rhythm at the rate of 86 bpm with first-degree AV block, left atrial enlargement, LVH. PVC. Nonspecific T abnormality.  Echo: 09/04/2016: Left ventricle: The cavity size was normal. Systolic function was mildly to moderately reduced. The estimated ejection fraction wasin the range of 40% to 45%. Diffuse hypokinesis. Left ventricular diastolic function parameters were normal. - Mitral valve: There was mild regurgitation. - Left atrium: The atrium was mildly dilated. - The right ventricular systolic pressure wasincreased consistent with severe pulmonary hypertension. PAsystolic pressure 66 mm Hg.   Assessment/Plan:  1. Acute on chronic systolic and diastolic heart failure, LVEF reduced mild to moderately at 40-45% with global hypokinesis. Elevated serum troponin due to CHF. Severe pulmonary hypertension due to LV failure.  2. Diabetes mellitus type 2 uncontrolled 3. Hypertension with hypertensive heart disease, S4 gallop. 4. Diabetic peripheral neuropathy 5. Hyperlipidemia  6. Anemia, macrocytic 7. Acute on chronic renal failure due to cardio-renal syndrome  and aggressive diuresis.   Recommendation: Patient continues to have dyspnea on exertion that is slowly improving. Trace leg edema still present; however, he continues to diurese well with PO Lasix BID. He may need to switch back to IV Lasix if leg edema continues to worsen. Kidney function has improved. Would still hold off on ACE inhibitor due to acute renal  failure. I agree with primary team on discontinuing amlodipine for improved renal perfusion. Continue toambulate as tolerated.   I do not think he needs invasive workup at this point although his EF is down and he has severe pulmonary hypertension, this is due to acute decompensated heart failure. Will consider either left and right heart catheterization versus stress testing once acute heart failure and acute renal failure have resolved. Will need outpatient follow up once stable for discharge.   Continued diabetes management per primary team.    Miquel Dunn, FNP-C. 09/07/2016, 12:30 PM [facility] Pager: [phone] Office: [phone] If no answer: [phone]

## 2016-09-08 ENCOUNTER — Inpatient Hospital Stay (HOSPITAL_COMMUNITY): Payer: Medicare Other

## 2016-09-08 DIAGNOSIS — I5021 Acute systolic (congestive) heart failure: Secondary | ICD-10-CM

## 2016-09-08 LAB — BASIC METABOLIC PANEL
ANION GAP: 7 (ref 5–15)
BUN: 31 mg/dL — ABNORMAL HIGH (ref 6–20)
CALCIUM: 8.3 mg/dL — AB (ref 8.9–10.3)
CO2: 31 mmol/L (ref 22–32)
Chloride: 98 mmol/L — ABNORMAL LOW (ref 101–111)
Creatinine, Ser: 1.39 mg/dL — ABNORMAL HIGH (ref 0.61–1.24)
GFR, EST AFRICAN AMERICAN: 58 mL/min — AB (ref >60)
GFR, EST NON AFRICAN AMERICAN: 50 mL/min — AB (ref >60)
Glucose, Bld: 128 mg/dL — ABNORMAL HIGH (ref 65–99)
Potassium: 4.2 mmol/L (ref 3.5–5.1)
SODIUM: 136 mmol/L (ref 135–145)

## 2016-09-08 LAB — GLUCOSE, CAPILLARY
GLUCOSE-CAPILLARY: 124 mg/dL — AB (ref 65–99)
Glucose-Capillary: 161 mg/dL — ABNORMAL HIGH (ref 65–99)

## 2016-09-08 MED ORDER — METOPROLOL TARTRATE 25 MG PO TABS: 25.0000 mg | ORAL_TABLET | Freq: Two times a day (BID) | ORAL | 0 refills | Status: DC

## 2016-09-08 MED ORDER — INSULIN DETEMIR 100 UNIT/ML FLEXPEN: 32.0000 [IU] | PEN_INJECTOR | Freq: Every day | SUBCUTANEOUS | 11 refills | Status: DC

## 2016-09-08 MED ORDER — ISOSORB DINITRATE-HYDRALAZINE 20-37.5 MG PO TABS: 1.0000 | ORAL_TABLET | Freq: Three times a day (TID) | ORAL | 0 refills | Status: DC

## 2016-09-08 MED ORDER — METHOCARBAMOL 750 MG PO TABS: 750.0000 mg | ORAL_TABLET | Freq: Four times a day (QID) | ORAL | 0 refills | Status: DC | PRN

## 2016-09-08 MED ORDER — FUROSEMIDE 40 MG PO TABS: 40.0000 mg | ORAL_TABLET | Freq: Two times a day (BID) | ORAL | 0 refills | Status: DC

## 2016-09-08 NOTE — Progress Notes (Signed)
Occupational Therapy Treatment Patient Details Name: David [name] MRN: [id number] DOB: 18-Feb-1946 Today's Date: 09/08/2016    History of present illness Pt is a 71 yo male admitted through ED for increased dyspnea and swelling over the past 3 weeks. Per chart questioning myocarditis vs HF. PMH significant for HTN, Glaucoma, GERD, DM2, OA, Anxiety, JVD.      OT comments  Pt demonstrating poor memory. Unable to recall last visit's education or management strategies for heart failure. Wife in room and aware. She reports he has had more difficulty with memory over the weekend due to pain medication. Pt educated and practiced use of AE for LB ADL. Pt will have 24 hour supervision of wife at home.  Follow Up Recommendations  No OT follow up    Equipment Recommendations  Tub/shower seat    Recommendations for Other Services      Precautions / Restrictions Precautions Precautions: Fall Precaution Comments: reports a fall s/p 1 month ago Restrictions Weight Bearing Restrictions: No       Mobility Bed Mobility Overal bed mobility: Modified Independent                Transfers                      Balance     Sitting balance-Leahy Scale: Good       Standing balance-Leahy Scale: Fair                             ADL either performed or assessed with clinical judgement   ADL Overall ADL's : Needs assistance/impaired     Grooming: Modified independent;Standing;Wash/dry hands         Lower Body Bathing Details (indicate cue type and reason): educated in use of long handled bath sponge     Lower Body Dressing: Set up;Sitting/lateral leans Lower Body Dressing Details (indicate cue type and reason): educated in use of reacher and sock aide to conserve energy Toilet Transfer: Modified Independent;Ambulation Toilet Transfer Details (indicate cue type and reason): stood to urinate Toileting- Clothing Manipulation and Hygiene: Modified  independent         General ADL Comments: Pt reports reviewing EC handout, but unable to recall any strategies, reeducated.      Vision       Perception     Praxis      Cognition Arousal/Alertness: Awake/alert Behavior During Therapy: WFL for tasks assessed/performed Overall Cognitive Status: Impaired/Different from baseline Area of Impairment: Memory                     Memory: Decreased short-term memory         General Comments: pt unable to retain education in how to manage CHF and energy conservation strategies.         Exercises     Shoulder Instructions       General Comments      Pertinent Vitals/ Pain       Pain Assessment: No/denies pain  Home Living                                          Prior Functioning/Environment              Frequency  Min 2X/week        Progress Toward Goals  OT Goals(current goals can now be found in the care plan section)  Progress towards OT goals: Progressing toward goals  Acute Rehab OT Goals Patient Stated Goal: to get home Time For Goal Achievement: 09/12/16 Potential to Achieve Goals: Good  Plan Discharge plan remains appropriate    Co-evaluation                 End of Session    OT Visit Diagnosis: Unsteadiness on feet (R26.81);History of falling (Z91.81);Other symptoms and signs involving cognitive function   Activity Tolerance Patient tolerated treatment well   Patient Left in bed;with call bell/phone within reach;with family/visitor present   Nurse Communication          Time: 4142-3953 OT Time Calculation (min): 25 min  Charges: OT General Charges $OT Visit: 1 Procedure OT Treatments $Self Care/Home Management : 23-37 mins     Malka So 09/08/2016, 11:15 AM  [phone]

## 2016-09-08 NOTE — Discharge Summary (Signed)
PATIENT DETAILS Name: David [name] Age: 71 y.o. Sex: male Date of Birth: [birth year] MRN: [id number]. Admitting Physician: Sid Falcon, MD AYT:KZSWFUXN Brown, NP  Admit Date: 09/03/2016 Discharge date: 09/08/2016  Recommendations for Outpatient Follow-up:  1. Follow up with PCP in 1-2 weeks 2. Please obtain BMP/CBC in one week 3. Please continue to adjust insulin regimen-since patient now seems motivated and compliant with diet, insulin requirements have come down.  Admitted From:  Home  Disposition: [place]: No  Equipment/Devices: None  Discharge Condition: Stable  CODE STATUS: FULL CODE  Diet recommendation:  Heart Healthy / Carb Modified  Brief Summary: See H&P, Labs, Consult and Test reports for all details in brief,Patient is a 71 y.o. male male with history of diabetes, hypertension, dyslipidemia, recent right great toe amputation (07/09/16) admitted with worsening exertional dyspnea, lower extremity edema, thought to have new onset CHF and admitted to the hospitalist service. See below for further details  Brief Hospital Course: Acute on chronic systolic and diastolic heart failure (EF 40-45% on echo on 09/04/16):  much improved, only minimal lower extremity edema on exam-good diuresis with Lasix, and BiDil. -4.1 L so far, weight decreased to 227 pounds (235 pounds on admission). Spoke with Dr [name], this morning, he recommends that patient be discharged on current regimen, he will follow-up in the outpatient setting in the next week or so, and will plan on her right/left heart catheterization and further optimization of his medication regimen. Patient family agreeable with this plan.  Acute kidney injury on chronic kidney disease stage III: Suspect hemodynamically mediated kidney injury in the setting of better blood pressure control, ACE inhibitor use and diuretic use. Slowly improving with adjustment of medications. Likely has underlying diabetic  nephropathy-given mild proteinuria in UA. Defer to PCP to refer to nephrology in the outpatient setting.  Hypertension: Controlled with metoprolol and BiDil.  Insulin-dependent DM-2:  noncompliant to diet prior to this hospitalization-after extensive counseling, he seems to be very motivated and has had significant reduction in his insulin regimen dosage. He has had episodes of hypoglycemia during the early part of his hospital stay, no further hypoglycemic episodes on 32 units of Levemir. For now I will just continue 30 units of Levemir-and hold all of his other medications to seen by his primary care practitioner   Severe pulmonary hypertension: Seen on echo-per cardiology this is likely due to left-sided heart failure. Cardiology planning on LHC/RNC in the next few weeks as an outpatient. Lower extremity Doppler negative. Retrospectively does acknowledge exertional dyspnea-up on walking to the grocery store, and even while mowing his lawn.   Dyslipidemia: Continue statin  Peripheral neuropathy: Continue Neurontin-appears stable.   Anemia: Likely related to underlying chronic diseas-hemoglobin stable, for outpatient workup.   Anxiety: Continue Xanax   Recent right great toe amputation: Stump site looks clean.  Chronic pain syndrome: Due to chronic back pain-has had multiple surgeries-continue home regimen of narcotics.  ? Sleep apnea: Encouraged patient to seek appointment with PCP for referral for outpatient polysomnography.  Procedures/Studies: Echo 4/23>> -Left ventricle: The cavity size was normal. Systolic function was   mildly to moderately reduced. The estimated ejection fraction was   in the range of 40% to 45%. Diffuse hypokinesis. Left ventricular   diastolic function parameters were normal. - Mitral valve: There was mild regurgitation. - Left atrium: The atrium was mildly dilated. - Atrial septum: No defect or patent foramen ovale was identified. - Impressions:  The right ventricular systolic  pressure was   increased consistent with severe pulmonary hypertension. PA   systolic pressure 66 mm Hg.  Discharge Diagnoses:  Active Problems:   Malaise   Hypertension   Hypertensive urgency   Diabetes mellitus ([place])   DOE (dyspnea on exertion)   JVD (jugular venous distension)   Pulmonary congestion   Discharge Instructions:  Activity:  As tolerated with Full fall precautions use walker/cane & assistance as needed  Discharge Instructions    (HEART FAILURE PATIENTS) Call MD:  Anytime you have any of the following symptoms: 1) 3 pound weight gain in 24 hours or 5 pounds in 1 week 2) shortness of breath, with or without a dry hacking cough 3) swelling in the hands, feet or stomach 4) if you have to sleep on extra pillows at night in order to breathe.    Complete by:  As directed    Diet - low sodium heart healthy    Complete by:  As directed    Heart Failure patients record your daily weight using the same scale at the same time of day    Complete by:  As directed    Increase activity slowly    Complete by:  As directed    STOP any activity that causes chest pain, shortness of breath, dizziness, sweating, or exessive weakness    Complete by:  As directed      Allergies as of 09/08/2016      Reactions   No Known Allergies       Medication List    STOP taking these medications   amLODipine 10 MG tablet Commonly known as:  NORVASC   chlorthalidone 25 MG tablet Commonly known as:  HYGROTON   doxycycline 100 MG tablet Commonly known as:  VIBRA-TABS   TRULICITY 1.5 VH/8.4ON Sopn Generic drug:  Dulaglutide     TAKE these medications   ALPRAZolam 1 MG tablet Commonly known as:  XANAX TAKE 1 TABLET BY MOUTH UP TO TWICE DAILY AS NEEDED   aspirin EC 81 MG tablet Take 81 mg by mouth daily.   atorvastatin 40 MG tablet Commonly known as:  LIPITOR Take 40 mg by mouth at bedtime.   B-D UF III MINI PEN NEEDLES 31G X 5 MM Misc Generic  drug:  Insulin Pen Needle USE TO INJECT INSULIN AS DIRECTED   docusate sodium 100 MG capsule Commonly known as:  COLACE Take 100 mg by mouth daily.   furosemide 40 MG tablet Commonly known as:  LASIX Take 1 tablet (40 mg total) by mouth 2 (two) times daily. Take twice daily for 5 days, and then daily after that   gabapentin 600 MG tablet Commonly known as:  NEURONTIN Take 600 mg by mouth 2 (two) times daily.   HYDROcodone-acetaminophen 10-325 MG tablet Commonly known as:  NORCO Take 1 tablet by mouth 2 (two) times daily as needed for moderate pain.   Insulin Detemir 100 UNIT/ML Pen Commonly known as:  LEVEMIR FLEXTOUCH Inject 32 Units into the skin daily. What changed:  how much to take   isosorbide-hydrALAZINE 20-37.5 MG tablet Commonly known as:  BIDIL Take 1 tablet by mouth 3 (three) times daily.   LUMIGAN 0.01 % Soln Generic drug:  bimatoprost Place 1 drop into both eyes at bedtime.   methocarbamol 750 MG tablet Commonly known as:  ROBAXIN Take 1 tablet (750 mg total) by mouth every 6 (six) hours as needed for muscle spasms.   metoprolol tartrate 25 MG tablet Commonly known as:  LOPRESSOR Take 1 tablet (25 mg total) by mouth 2 (two) times daily.      Follow-up Information    Eloise Levels, NP. Schedule an appointment as soon as possible for a visit in 1 week(s).   Specialty:  Nurse Practitioner Contact information: [phone] [place] [address] [phone]        Adrian Prows, MD. Schedule an appointment as soon as possible for a visit in 1 week(s).   Specialty:  Cardiology Contact information: [address] [place] [address] [phone]          Allergies  Allergen Reactions  . No Known Allergies    Consultations:   cardiology  Other Procedures/Studies: Dg Chest 2 View  Result Date: 09/03/2016 CLINICAL DATA:  Shortness of breath cough and weakness today. EXAM: CHEST  2 VIEW COMPARISON:  09/14/2013 and prior  chest radiographs FINDINGS: Cardiomegaly with mild interstitial pulmonary edema noted. Bilateral lower lung opacities probably represent edema/ atelectasis but superimposed infection is not entirely excluded. Small bilateral pleural effusions are noted. There is no evidence of pneumothorax. IMPRESSION: Cardiomegaly with mild interstitial pulmonary edema and small bilateral pleural effusions. Bilateral lower lung opacities likely representing edema/ atelectasis but superimposed infection is not entirely excluded. Correlate clinically. Electronically Signed   By: Margarette Canada M.D.   On: 09/03/2016 18:44   Dg Chest Port 1 View  Result Date: 09/08/2016 CLINICAL DATA:  71 year old male with shortness of breath EXAM: PORTABLE CHEST 1 VIEW COMPARISON:  Prior chest x-ray 09/05/2017 and 09/13/2013 FINDINGS: Nonspecific patchy airspace opacity in the medial right lower lobe. No pleural effusion or pneumothorax. Cardiac and mediastinal contours are within normal limits. No acute fracture or lytic or blastic osseous lesions. The visualized upper abdominal bowel gas pattern is unremarkable. IMPRESSION: 1. Nonspecific patchy airspace opacity in the medial right lower lobe. Differential considerations include bronchopneumonia, atelectasis, and possibly a region of pleuroparenchymal scarring although this is a new finding compared to 09/13/2013. Followup PA and lateral chest X-ray is recommended in 3-4 weeks following trial of antibiotic therapy to ensure resolution and exclude underlying malignancy. Electronically Signed   By: Jacqulynn Cadet M.D.   On: 09/08/2016 07:41   Dg Chest Port 1 View  Result Date: 09/04/2016 CLINICAL DATA:  Shortness of breath, hypertension, diabetes EXAM: PORTABLE CHEST 1 VIEW COMPARISON:  PA and lateral chest x-ray of September 03, 2016 FINDINGS: The lungs are reasonably well inflated. The interstitial markings have improved considerably. The cardiac silhouette remains mildly enlarged. The  pulmonary vascularity is less engorged. The left hemidiaphragm is partially obscured. There is calcification in the wall of the aortic arch. The bony thorax is unremarkable. IMPRESSION: Interval improvement in pulmonary interstitial edema. Persistent mild left basilar subsegmental atelectasis and small left pleural effusion. Thoracic aortic atherosclerosis. Electronically Signed   By: David  Martinique M.D.   On: 09/04/2016 07:23   Dg Chest Port 1v Same Day  Result Date: 09/05/2016 CLINICAL DATA:  Shortness breath for several weeks. EXAM: PORTABLE CHEST 1 VIEW COMPARISON:  09/04/2016 FINDINGS: Low lung volumes again noted. Heart size is stable. Persistent opacity seen in the left lung base, without significant change. No new or worsening areas of pulmonary opacity identified. IMPRESSION: No significant change in low lung volumes and left basilar opacity, which may be due to atelectasis, infiltrate, or pleural effusion. Electronically Signed   By: Earle Gell M.D.   On: 09/05/2016 08:11      TODAY-DAY OF DISCHARGE:  Subjective:  David [name] today has no headache,no chest abdominal pain,no new weakness tingling or numbness, feels much better wants to go home today.   Objective:   Blood pressure (!) 143/75, pulse 72, temperature 98.1 F (36.7 C), temperature source Oral, resp. rate 20, height 6\' 2"  (1.88 m), weight 103.1 kg (227 lb 3.2 oz), SpO2 99 %.  Intake/Output Summary (Last 24 hours) at 09/08/16 1015 Last data filed at 09/08/16 0530  Gross per 24 hour  Intake              720 ml  Output             1300 ml  Net             -580 ml   Filed Weights   09/06/16 0538 09/07/16 0500 09/08/16 0528  Weight: 103.6 kg (228 lb 8 oz) 103.8 kg (228 lb 12.8 oz) 103.1 kg (227 lb 3.2 oz)    Exam: Awake Alert, Oriented *3, No new F.N deficits, Normal affect [place].AT,PERRAL Supple Neck,No JVD, No cervical lymphadenopathy appriciated.  Symmetrical Chest wall movement, Good air movement bilaterally,  CTAB RRR,No Gallops,Rubs or new Murmurs, No Parasternal Heave +ve B.Sounds, Abd Soft, Non tender, No organomegaly appriciated, No rebound -guarding or rigidity. No Cyanosis, Clubbing No new Rash or bruise. Trace lower ext edema   PERTINENT RADIOLOGIC STUDIES: Dg Chest 2 View  Result Date: 09/03/2016 CLINICAL DATA:  Shortness of breath cough and weakness today. EXAM: CHEST  2 VIEW COMPARISON:  09/14/2013 and prior chest radiographs FINDINGS: Cardiomegaly with mild interstitial pulmonary edema noted. Bilateral lower lung opacities probably represent edema/ atelectasis but superimposed infection is not entirely excluded. Small bilateral pleural effusions are noted. There is no evidence of pneumothorax. IMPRESSION: Cardiomegaly with mild interstitial pulmonary edema and small bilateral pleural effusions. Bilateral lower lung opacities likely representing edema/ atelectasis but superimposed infection is not entirely excluded. Correlate clinically. Electronically Signed   By: Margarette Canada M.D.   On: 09/03/2016 18:44   Dg Chest Port 1 View  Result Date: 09/08/2016 CLINICAL DATA:  71 year old male with shortness of breath EXAM: PORTABLE CHEST 1 VIEW COMPARISON:  Prior chest x-ray 09/05/2017 and 09/13/2013 FINDINGS: Nonspecific patchy airspace opacity in the medial right lower lobe. No pleural effusion or pneumothorax. Cardiac and mediastinal contours are within normal limits. No acute fracture or lytic or blastic osseous lesions. The visualized upper abdominal bowel gas pattern is unremarkable. IMPRESSION: 1. Nonspecific patchy airspace opacity in the medial right lower lobe. Differential considerations include bronchopneumonia, atelectasis, and possibly a region of pleuroparenchymal scarring although this is a new finding compared to 09/13/2013. Followup PA and lateral chest X-ray is recommended in 3-4 weeks following trial of antibiotic therapy to ensure resolution and exclude underlying malignancy.  Electronically Signed   By: Jacqulynn Cadet M.D.   On: 09/08/2016 07:41   Dg Chest Port 1 View  Result Date: 09/04/2016 CLINICAL DATA:  Shortness of breath, hypertension, diabetes EXAM: PORTABLE CHEST 1 VIEW COMPARISON:  PA and lateral chest x-ray of September 03, 2016 FINDINGS: The lungs are reasonably well inflated. The interstitial markings have improved considerably. The cardiac silhouette remains mildly enlarged. The pulmonary vascularity is less engorged. The left hemidiaphragm is partially obscured. There is calcification in the wall of the aortic arch. The bony thorax is unremarkable. IMPRESSION: Interval improvement in pulmonary interstitial edema. Persistent mild left basilar subsegmental atelectasis and small left pleural effusion. Thoracic aortic atherosclerosis. Electronically Signed   By: David  Martinique M.D.   On:  09/04/2016 07:23   Dg Chest Port 1v Same Day  Result Date: 09/05/2016 CLINICAL DATA:  Shortness breath for several weeks. EXAM: PORTABLE CHEST 1 VIEW COMPARISON:  09/04/2016 FINDINGS: Low lung volumes again noted. Heart size is stable. Persistent opacity seen in the left lung base, without significant change. No new or worsening areas of pulmonary opacity identified. IMPRESSION: No significant change in low lung volumes and left basilar opacity, which may be due to atelectasis, infiltrate, or pleural effusion. Electronically Signed   By: Earle Gell M.D.   On: 09/05/2016 08:11     PERTINENT LAB RESULTS: CBC: No results for input(s): WBC, HGB, HCT, PLT in the last 72 hours. CMET CMP     Component Value Date/Time   NA 136 09/08/2016 0508   K 4.2 09/08/2016 0508   CL 98 (L) 09/08/2016 0508   CO2 31 09/08/2016 0508   GLUCOSE 128 (H) 09/08/2016 0508   BUN 31 (H) 09/08/2016 0508   CREATININE 1.39 (H) 09/08/2016 0508   CALCIUM 8.3 (L) 09/08/2016 0508   PROT 7.9 09/03/2016 2255   ALBUMIN 3.4 (L) 09/03/2016 2255   AST 23 09/03/2016 2255   ALT 42 09/03/2016 2255   ALKPHOS  191 (H) 09/03/2016 2255   BILITOT 1.0 09/03/2016 2255   GFRNONAA 50 (L) 09/08/2016 0508   GFRAA 58 (L) 09/08/2016 0508    GFR Estimated Creatinine Clearance: 64.3 mL/min (A) (by C-G formula based on SCr of 1.39 mg/dL (H)). No results for input(s): LIPASE, AMYLASE in the last 72 hours. No results for input(s): CKTOTAL, CKMB, CKMBINDEX, TROPONINI in the last 72 hours. Invalid input(s): POCBNP No results for input(s): DDIMER in the last 72 hours. No results for input(s): HGBA1C in the last 72 hours. No results for input(s): CHOL, HDL, LDLCALC, TRIG, CHOLHDL, LDLDIRECT in the last 72 hours. No results for input(s): TSH, T4TOTAL, T3FREE, THYROIDAB in the last 72 hours.  Invalid input(s): FREET3 No results for input(s): VITAMINB12, FOLATE, FERRITIN, TIBC, IRON, RETICCTPCT in the last 72 hours. Coags: No results for input(s): INR in the last 72 hours.  Invalid input(s): PT Microbiology: Recent Results (from the past 240 hour(s))  MRSA PCR Screening     Status: None   Collection Time: 09/04/16 12:48 AM  Result Value Ref Range Status   MRSA by PCR NEGATIVE NEGATIVE Final    Comment:        The GeneXpert MRSA Assay (FDA approved for NASAL specimens only), is one component of a comprehensive MRSA colonization surveillance program. It is not intended to diagnose MRSA infection nor to guide or monitor treatment for MRSA infections.     FURTHER DISCHARGE INSTRUCTIONS:  Get Medicines reviewed and adjusted: Please take all your medications with you for your next visit with your Primary MD  Laboratory/radiological data: Please request your Primary MD to go over all hospital tests and procedure/radiological results at the follow up, please ask your Primary MD to get all Hospital records sent to his/her office.  In some cases, they will be blood work, cultures and biopsy results pending at the time of your discharge. Please request that your primary care M.D. goes through all the  records of your hospital data and follows up on these results.  Also Note the following: If you experience worsening of your admission symptoms, develop shortness of breath, life threatening emergency, suicidal or homicidal thoughts you must seek medical attention immediately by calling 911 or calling your MD immediately  if symptoms less severe.  You must  read complete instructions/literature along with all the possible adverse reactions/side effects for all the Medicines you take and that have been prescribed to you. Take any new Medicines after you have completely understood and accpet all the possible adverse reactions/side effects.   Do not drive when taking Pain medications or sleeping medications (Benzodaizepines)  Do not take more than prescribed Pain, Sleep and Anxiety Medications. It is not advisable to combine anxiety,sleep and pain medications without talking with your primary care practitioner  Special Instructions: If you have smoked or chewed Tobacco  in the last 2 yrs please stop smoking, stop any regular Alcohol  and or any Recreational drug use.  Wear Seat belts while driving.  Please note: You were cared for by a hospitalist during your hospital stay. Once you are discharged, your primary care physician will handle any further medical issues. Please note that NO REFILLS for any discharge medications will be authorized once you are discharged, as it is imperative that you return to your primary care physician (or establish a relationship with a primary care physician if you do not have one) for your post hospital discharge needs so that they can reassess your need for medications and monitor your lab values.  Total Time spent coordinating discharge including counseling, education and face to face time equals  45 minutes.  SignedOren Binet 09/08/2016 10:15 AM

## 2016-09-08 NOTE — Progress Notes (Signed)
CM talked to patient about DCP with spouse at the bedside; patient refused all [place] services at this time stated" I don't need that." He has 4 glucometer machines at home per spouse and does not qualify for home oxygen. Lots of encouragement given ( pt keeps referring back to when he was ill prior to admission). All questions answered; Aneta Mins [phone]

## 2016-09-08 NOTE — Progress Notes (Signed)
SATURATION QUALIFICATIONS: (This note is used to comply with regulatory documentation for home oxygen)  Patient Saturations on Room Air at Rest = 94%  Patient Saturations on Room Air while Ambulating = 88-93%  Pt does not meet home O2 requirements.

## 2016-09-08 NOTE — Progress Notes (Signed)
Pt has orders to be discharged. Discharge instructions given and pt has no additional questions at this time. Medication regimen reviewed and pt educated. Pt verbalized understanding and has no additional questions. Telemetry box removed. IV removed and site in good condition. Pt stable and waiting for transportation.   Aisling Emigh RN 

## 2016-09-08 NOTE — Progress Notes (Signed)
Subjective:  Patient presently doing well. Continues to report DOE that is improving. Leg edema has improved also. No chest pain no PND or orthopnea.   Objective:  Vital Signs in the last 24 hours: Temp:  [98.1 F (36.7 C)-98.8 F (37.1 C)] 98.1 F (36.7 C) (04/23 0528) Pulse Rate:  [64-72] 72 (04/23 0528) Resp:  [20] 20 (04/23 0528) BP: (120-143)/(66-79) 143/75 (04/23 0528) SpO2:  [97 %-99 %] 99 % (04/23 0528) Weight:  [103.1 kg (227 lb 3.2 oz)] 103.1 kg (227 lb 3.2 oz) (04/23 0528)  Intake/Output from previous day: 04/22 0701 - 04/23 0700 In: 1080 [P.O.:1080] Out: 2500 [Urine:2500]  Physical Exam:  General appearance: alert, cooperative, appears stated age, mild distress and mildly obese Lungs: Clear bilaterally. No rhonchi, wheezes, or crackles. Heart: S1, S2 normal, S4 present and systolic murmur: early [id number]/2, blowingat 2nd left intercostal space, at lower left sternal border Abdomen: soft, non-tender; bowel sounds normal; no masses, no organomegaly Extremities: Trace leg edema, slightly worse than yesterday. Skin is warm. No signs of acute arterial insufficiency. Right greater toe amputation site is healed well. Pulses: 2+ and symmetric Neurologic: Grossly normal    Lab Results: BMP  Recent Labs  09/06/16 1229 09/07/16 0557 09/08/16 0508  NA 134* 133* 136  K 4.6 4.8 4.2  CL 97* 96* 98*  CO2 31 30 31   GLUCOSE 141* 193* 128*  BUN 40* 36* 31*  CREATININE 2.05* 1.70* 1.39*  CALCIUM 8.3* 8.2* 8.3*  GFRNONAA 31* 39* 50*  GFRAA 36* 46* 58*    CBC  Recent Labs Lab 09/04/16 0700  WBC 10.7*  RBC 2.85*  HGB 9.4*  HCT 28.9*  PLT 378  MCV 101.4*  MCH 33.0  MCHC 32.5  RDW 12.4    HEMOGLOBIN A1C Lab Results  Component Value Date   HGBA1C 7.7 (H) 08/14/2013   MPG 174 (H) 08/14/2013    Cardiac Panel (last 3 results)  Recent Labs  09/04/16 0119 09/04/16 0700 09/04/16 1236  TROPONINI 0.05* 0.06* 0.05*   TSH  Recent Labs  09/04/16 0119   TSH 1.411   Hepatic Function Panel  Recent Labs  09/03/16 2255  PROT 7.9  ALBUMIN 3.4*  AST 23  ALT 42  ALKPHOS 191*  BILITOT 1.0  BILIDIR <0.1*  IBILI NOT CALCULATED    Imaging: Imaging studies reviewed.  Cardiac Studies:  EKG 09/04/2016: Sinus rhythm at the rate of 86 bpm with first-degree AV block, left atrial enlargement, LVH. PVC. Nonspecific T abnormality.  Echo: 09/04/2016: Left ventricle: The cavity size was normal. Systolic function was mildly to moderately reduced. The estimated ejection fraction wasin the range of 40% to 45%. Diffuse hypokinesis. Left ventricular diastolic function parameters were normal. - Mitral valve: There was mild regurgitation. - Left atrium: The atrium was mildly dilated. - The right ventricular systolic pressure wasincreased consistent with severe pulmonary hypertension. PAsystolic pressure 66 mm Hg.   Assessment/Plan:  1. Acute on chronic systolic and diastolic heart failure, LVEF reduced mild to moderately at 40-45% with global hypokinesis. Elevated serum troponin due to CHF. Severe pulmonary hypertension due to LV failure.  2. Diabetes mellitus type 2 uncontrolled 3. Hypertension with hypertensive heart disease, S4 gallop. 4. Diabetic peripheral neuropathy 5. Hyperlipidemia  6. Anemia, macrocytic 7. Acute on chronic renal failure due to cardio-renal syndrome, resolved.   Recommendation:  He is now improved and CHF appears stable. He can be discharged home on the present meds and I can see him in 10 d to 2  weeks  I will see him on 09/25/16 at 11:30 AM. He will need left and right heart catheterization and this can be done on an elective fashion.  Renal failure has now resolved.  He appears to be very motivated in making lifestyle changes.  Appointment with our office was made.   Adrian Prows, MD 09/08/2016, 10:19 AM [facility] Pager: [phone] Office: [phone] If no answer: [phone]

## 2016-09-08 NOTE — Progress Notes (Signed)
Nutrition Education Note  RD consulted for nutrition education regarding new onset CHF.  RD provided "Heart failure nutrition therapy" handout from the [employer]. Reviewed patient's dietary recall. Provided examples on ways to decrease sodium intake in diet. Discouraged intake of processed foods and use of salt shaker. Encouraged fresh fruits and vegetables as well as whole grain sources of carbohydrates to maximize fiber intake.   RD discussed why it is important for patient to adhere to diet recommendations, and emphasized the role of fluids, foods to avoid, and importance of weighing self daily. Teach back method used.  Expect fair compliance.  Body mass index is 29.17 kg/m. Pt meets criteria for overweight based on current BMI.  Current diet order is low sodium, patient is consuming approximately 100% of meals at this time. Labs and medications reviewed. No further nutrition interventions warranted at this time. RD contact information provided. If additional nutrition issues arise, please re-consult RD.   Koleen Distance, RD, LDN Pager #- [phone]

## 2016-09-11 ENCOUNTER — Encounter (INDEPENDENT_AMBULATORY_CARE_PROVIDER_SITE_OTHER): Payer: Self-pay | Admitting: Orthopaedic Surgery

## 2016-09-11 ENCOUNTER — Ambulatory Visit (INDEPENDENT_AMBULATORY_CARE_PROVIDER_SITE_OTHER): Payer: Medicare Other | Admitting: Orthopaedic Surgery

## 2016-09-11 ENCOUNTER — Ambulatory Visit (INDEPENDENT_AMBULATORY_CARE_PROVIDER_SITE_OTHER): Payer: Medicare Other

## 2016-09-11 DIAGNOSIS — M79671 Pain in right foot: Secondary | ICD-10-CM | POA: Diagnosis not present

## 2016-09-11 NOTE — Progress Notes (Signed)
Office Visit Note   Patient: David [name]           Date of Birth: [birth year]           MRN: [id number] Visit Date: 09/11/2016              Requested by: Eloise Levels, NP [id number], [place] [address] PCP: Eloise Levels, NP   Assessment & Plan: Visit Diagnoses:  1. Pain in right foot     Plan: I gave him reassurance there is no evidence of infection in his foot. He is wearing deathly appropriately fitting shoes with a wide toe box. Most of the clawing comes from his neuropathy and diabetes. He understands he needs to look at his feet daily to make sure no wounds or developing. I believe he has follow-up in the future with [name] for regular follow-up for his foot.  Follow-Up Instructions: No Follow-up on file.   Orders:  Orders Placed This Encounter  Procedures  . XR Foot Complete Right   No orders of the defined types were placed in this encounter.     Procedures: No procedures performed   Clinical Data: No additional findings.   Subjective: Chief Complaint  Patient presents with  . Right Foot - Pain   The patient is a patient of [name] who had a indentation his great toe up to the MTP joint earlier this year. He said that's been doing well but he was concerned about pain in his foot although he is someone who is a diabetic with neuropathy he is in chronic pain management gets hydrocodone daily. He did listen to Dr. Jess Barters advice and got better fitting shoes with wide toe box. He denies any drainage or open wounds. Says he does have pain in those toes. He wore open back shoes for a long time before elicited [name] about stopping wearing his slippers. HPI  Review of Systems  He denies any recent fever chills or nausea or vomiting. He denies any chest pain or shortness of breath. Objective: Vital Signs: There were no vitals taken for this visit.  Physical Exam He is alert and when 3 in no acute distress Ortho Exam Examination of his  right foot shows no open wounds. He has callus formation at the end of his amputation site of the first ray but there is no drainage from this at all and no open wounds. He does have claw toe deformities of all lesser toes but no significant wounds over the PIP joints. Specialty Comments:  No specialty comments available.  Imaging: Xr Foot Complete Right  Result Date: 09/11/2016 3 views of his right foot show a first ray amputation. There is no evidence of osteomyelitis. It does appear that he has claw versus hammertoes. There is arthritic changes of the MTP joints of the lesser toes but no evidence of osteomyelitis or other deformities.    PMFS History: Patient Active Problem List   Diagnosis Date Noted  . JVD (jugular venous distension)   . Pulmonary congestion   . DOE (dyspnea on exertion) 09/03/2016  . Acquired contracture of Achilles tendon, right 08/19/2016  . Amputated great toe, right ([place]) 07/18/2016  . Onychomycosis 05/29/2016  . Diabetic polyneuropathy associated with type 2 diabetes mellitus ([place]) 04/09/2016  . Sepsis due to cellulitis ([place]) 02/07/2015  . CKD (chronic kidney disease) 02/07/2015  . Sepsis ([place]) 02/06/2015  . Unspecified hereditary and idiopathic peripheral neuropathy 09/15/2013  . Hypertension, uncontrolled 09/14/2013  .  Diabetes mellitus ([place]) 09/14/2013  . Malaise 08/14/2013  . Intractable nausea and vomiting 08/14/2013  . Acute gastroenteritis 08/14/2013  . Hypertension 08/14/2013  . Chronic back pain 08/14/2013  . Glaucoma 08/14/2013  . Headache 08/14/2013  . Hypertensive urgency 08/14/2013   Past Medical History:  Diagnosis Date  . Arthritis   . DDD (degenerative disc disease), cervical   . Diabetes mellitus without complication ([place])    type II  . Diabetic neuropathy ([place])   . GERD (gastroesophageal reflux disease)   . Glaucoma   . Hypertension     Family History  Problem Relation Age of Onset  . Diabetes Other   . Hyperlipidemia  Other   . Hypertension Other   . Stroke Other   . Alzheimer's disease Other   . Thyroid disease Mother   . Diabetes Mellitus II Father     Past Surgical History:  Procedure Laterality Date  . AMPUTATION Right 07/09/2016   Procedure: Right Great Toe Amputation at Metatarsophalangeal Joint;  Surgeon: [name], MD;  Location: [facility];  Service: Orthopedics;  Laterality: Right;  . BACK SURGERY     4  . CYST EXCISION     on Back  . JOINT REPLACEMENT    . TOTAL HIP ARTHROPLASTY     Right    Social History   Occupational History  . Not on file.   Social History Main Topics  . Smoking status: Never Smoker  . Smokeless tobacco: Never Used  . Alcohol use Yes     Comment: occ  . Drug use: No  . Sexual activity: Yes

## 2016-09-23 ENCOUNTER — Ambulatory Visit (INDEPENDENT_AMBULATORY_CARE_PROVIDER_SITE_OTHER): Payer: Medicare Other | Admitting: Orthopedic Surgery

## 2016-09-25 ENCOUNTER — Ambulatory Visit (INDEPENDENT_AMBULATORY_CARE_PROVIDER_SITE_OTHER): Payer: Medicare Other | Admitting: Orthopedic Surgery

## 2017-03-06 ENCOUNTER — Other Ambulatory Visit: Payer: Self-pay | Admitting: Neurosurgery

## 2017-03-29 ENCOUNTER — Inpatient Hospital Stay (HOSPITAL_COMMUNITY)
Admission: EM | Admit: 2017-03-29 | Discharge: 2017-04-09 | DRG: 246 | Disposition: A | Discharge status: Home / Self Care | Payer: Medicare Other | Attending: Internal Medicine | Admitting: Internal Medicine

## 2017-03-29 ENCOUNTER — Encounter (HOSPITAL_COMMUNITY): Payer: Self-pay | Admitting: Emergency Medicine

## 2017-03-29 ENCOUNTER — Emergency Department (HOSPITAL_COMMUNITY): Payer: Medicare Other

## 2017-03-29 ENCOUNTER — Inpatient Hospital Stay (HOSPITAL_COMMUNITY): Payer: Medicare Other

## 2017-03-29 DIAGNOSIS — M549 Dorsalgia, unspecified: Secondary | ICD-10-CM | POA: Diagnosis not present

## 2017-03-29 DIAGNOSIS — G8929 Other chronic pain: Secondary | ICD-10-CM | POA: Diagnosis present

## 2017-03-29 DIAGNOSIS — I10 Essential (primary) hypertension: Secondary | ICD-10-CM | POA: Diagnosis present

## 2017-03-29 DIAGNOSIS — I130 Hypertensive heart and chronic kidney disease with heart failure and stage 1 through stage 4 chronic kidney disease, or unspecified chronic kidney disease: Secondary | ICD-10-CM | POA: Diagnosis present

## 2017-03-29 DIAGNOSIS — I509 Heart failure, unspecified: Secondary | ICD-10-CM

## 2017-03-29 DIAGNOSIS — J9621 Acute and chronic respiratory failure with hypoxia: Secondary | ICD-10-CM | POA: Diagnosis present

## 2017-03-29 DIAGNOSIS — E1142 Type 2 diabetes mellitus with diabetic polyneuropathy: Secondary | ICD-10-CM | POA: Diagnosis present

## 2017-03-29 DIAGNOSIS — Z9111 Patient's noncompliance with dietary regimen: Secondary | ICD-10-CM | POA: Diagnosis not present

## 2017-03-29 DIAGNOSIS — M7989 Other specified soft tissue disorders: Secondary | ICD-10-CM | POA: Diagnosis not present

## 2017-03-29 DIAGNOSIS — E1122 Type 2 diabetes mellitus with diabetic chronic kidney disease: Secondary | ICD-10-CM | POA: Diagnosis present

## 2017-03-29 DIAGNOSIS — Z7982 Long term (current) use of aspirin: Secondary | ICD-10-CM

## 2017-03-29 DIAGNOSIS — M79609 Pain in unspecified limb: Secondary | ICD-10-CM | POA: Diagnosis not present

## 2017-03-29 DIAGNOSIS — Z89411 Acquired absence of right great toe: Secondary | ICD-10-CM

## 2017-03-29 DIAGNOSIS — Z794 Long term (current) use of insulin: Secondary | ICD-10-CM

## 2017-03-29 DIAGNOSIS — I5023 Acute on chronic systolic (congestive) heart failure: Secondary | ICD-10-CM | POA: Diagnosis not present

## 2017-03-29 DIAGNOSIS — Z833 Family history of diabetes mellitus: Secondary | ICD-10-CM

## 2017-03-29 DIAGNOSIS — R0602 Shortness of breath: Secondary | ICD-10-CM | POA: Diagnosis not present

## 2017-03-29 DIAGNOSIS — E119 Type 2 diabetes mellitus without complications: Secondary | ICD-10-CM | POA: Diagnosis not present

## 2017-03-29 DIAGNOSIS — J9601 Acute respiratory failure with hypoxia: Secondary | ICD-10-CM | POA: Diagnosis present

## 2017-03-29 DIAGNOSIS — H409 Unspecified glaucoma: Secondary | ICD-10-CM | POA: Diagnosis present

## 2017-03-29 DIAGNOSIS — I2510 Atherosclerotic heart disease of native coronary artery without angina pectoris: Secondary | ICD-10-CM | POA: Diagnosis present

## 2017-03-29 DIAGNOSIS — N182 Chronic kidney disease, stage 2 (mild): Secondary | ICD-10-CM | POA: Diagnosis not present

## 2017-03-29 DIAGNOSIS — N189 Chronic kidney disease, unspecified: Secondary | ICD-10-CM | POA: Diagnosis not present

## 2017-03-29 DIAGNOSIS — Z955 Presence of coronary angioplasty implant and graft: Secondary | ICD-10-CM

## 2017-03-29 DIAGNOSIS — D631 Anemia in chronic kidney disease: Secondary | ICD-10-CM | POA: Diagnosis present

## 2017-03-29 DIAGNOSIS — N1832 Chronic kidney disease, stage 3b: Secondary | ICD-10-CM

## 2017-03-29 DIAGNOSIS — T502X5A Adverse effect of carbonic-anhydrase inhibitors, benzothiadiazides and other diuretics, initial encounter: Secondary | ICD-10-CM | POA: Diagnosis not present

## 2017-03-29 DIAGNOSIS — N183 Chronic kidney disease, stage 3 unspecified: Secondary | ICD-10-CM

## 2017-03-29 DIAGNOSIS — E1165 Type 2 diabetes mellitus with hyperglycemia: Secondary | ICD-10-CM | POA: Diagnosis present

## 2017-03-29 DIAGNOSIS — I2584 Coronary atherosclerosis due to calcified coronary lesion: Secondary | ICD-10-CM | POA: Diagnosis present

## 2017-03-29 DIAGNOSIS — Z8249 Family history of ischemic heart disease and other diseases of the circulatory system: Secondary | ICD-10-CM

## 2017-03-29 DIAGNOSIS — G4730 Sleep apnea, unspecified: Secondary | ICD-10-CM | POA: Diagnosis present

## 2017-03-29 DIAGNOSIS — I272 Pulmonary hypertension, unspecified: Secondary | ICD-10-CM | POA: Diagnosis present

## 2017-03-29 DIAGNOSIS — J189 Pneumonia, unspecified organism: Secondary | ICD-10-CM

## 2017-03-29 DIAGNOSIS — D539 Nutritional anemia, unspecified: Secondary | ICD-10-CM | POA: Diagnosis present

## 2017-03-29 DIAGNOSIS — N179 Acute kidney failure, unspecified: Secondary | ICD-10-CM | POA: Diagnosis present

## 2017-03-29 DIAGNOSIS — E1169 Type 2 diabetes mellitus with other specified complication: Secondary | ICD-10-CM

## 2017-03-29 DIAGNOSIS — I160 Hypertensive urgency: Secondary | ICD-10-CM | POA: Diagnosis present

## 2017-03-29 DIAGNOSIS — I5033 Acute on chronic diastolic (congestive) heart failure: Secondary | ICD-10-CM | POA: Diagnosis present

## 2017-03-29 DIAGNOSIS — E669 Obesity, unspecified: Secondary | ICD-10-CM | POA: Diagnosis present

## 2017-03-29 DIAGNOSIS — Z79899 Other long term (current) drug therapy: Secondary | ICD-10-CM

## 2017-03-29 DIAGNOSIS — K219 Gastro-esophageal reflux disease without esophagitis: Secondary | ICD-10-CM | POA: Diagnosis present

## 2017-03-29 DIAGNOSIS — R9439 Abnormal result of other cardiovascular function study: Secondary | ICD-10-CM | POA: Diagnosis present

## 2017-03-29 DIAGNOSIS — Z96641 Presence of right artificial hip joint: Secondary | ICD-10-CM | POA: Diagnosis present

## 2017-03-29 DIAGNOSIS — I25118 Atherosclerotic heart disease of native coronary artery with other forms of angina pectoris: Secondary | ICD-10-CM | POA: Diagnosis present

## 2017-03-29 DIAGNOSIS — Z6829 Body mass index (BMI) 29.0-29.9, adult: Secondary | ICD-10-CM

## 2017-03-29 HISTORY — DX: Heart failure, unspecified: I50.9

## 2017-03-29 LAB — CBC WITH DIFFERENTIAL/PLATELET
BASOS PCT: 0 %
Basophils Absolute: 0 10*3/uL (ref 0.0–0.1)
Eosinophils Absolute: 0 10*3/uL (ref 0.0–0.7)
Eosinophils Relative: 0 %
HEMATOCRIT: 36.2 % — AB (ref 39.0–52.0)
HEMOGLOBIN: 11.8 g/dL — AB (ref 13.0–17.0)
LYMPHS ABS: 1.2 10*3/uL (ref 0.7–4.0)
Lymphocytes Relative: 7 %
MCH: 34.3 pg — ABNORMAL HIGH (ref 26.0–34.0)
MCHC: 32.6 g/dL (ref 30.0–36.0)
MCV: 105.2 fL — ABNORMAL HIGH (ref 78.0–100.0)
MONOS PCT: 7 %
Monocytes Absolute: 1.3 10*3/uL — ABNORMAL HIGH (ref 0.1–1.0)
NEUTROS ABS: 14.6 10*3/uL — AB (ref 1.7–7.7)
NEUTROS PCT: 86 %
Platelets: 274 10*3/uL (ref 150–400)
RBC: 3.44 MIL/uL — ABNORMAL LOW (ref 4.22–5.81)
RDW: 12 % (ref 11.5–15.5)
WBC: 17.2 10*3/uL — ABNORMAL HIGH (ref 4.0–10.5)

## 2017-03-29 LAB — BASIC METABOLIC PANEL
ANION GAP: 8 (ref 5–15)
BUN: 19 mg/dL (ref 6–20)
CALCIUM: 8.4 mg/dL — AB (ref 8.9–10.3)
CHLORIDE: 103 mmol/L (ref 101–111)
CO2: 26 mmol/L (ref 22–32)
Creatinine, Ser: 1.32 mg/dL — ABNORMAL HIGH (ref 0.61–1.24)
GFR calc non Af Amer: 53 mL/min — ABNORMAL LOW (ref >60)
Glucose, Bld: 289 mg/dL — ABNORMAL HIGH (ref 65–99)
Potassium: 4 mmol/L (ref 3.5–5.1)
Sodium: 137 mmol/L (ref 135–145)

## 2017-03-29 LAB — I-STAT ARTERIAL BLOOD GAS, ED
Acid-Base Excess: 2 mmol/L (ref 0.0–2.0)
BICARBONATE: 27.7 mmol/L (ref 20.0–28.0)
O2 Saturation: 99 %
PH ART: 7.388 (ref 7.350–7.450)
TCO2: 29 mmol/L (ref 22–32)
pCO2 arterial: 46.1 mmHg (ref 32.0–48.0)
pO2, Arterial: 162 mmHg — ABNORMAL HIGH (ref 83.0–108.0)

## 2017-03-29 LAB — GLUCOSE, CAPILLARY
GLUCOSE-CAPILLARY: 350 mg/dL — AB (ref 65–99)
GLUCOSE-CAPILLARY: 150 mg/dL — AB (ref 65–99)
GLUCOSE-CAPILLARY: 84 mg/dL (ref 65–99)
Glucose-Capillary: 273 mg/dL — ABNORMAL HIGH (ref 65–99)

## 2017-03-29 LAB — CBC
HCT: 31.6 % — ABNORMAL LOW (ref 39.0–52.0)
HEMOGLOBIN: 10.3 g/dL — AB (ref 13.0–17.0)
MCH: 34.2 pg — ABNORMAL HIGH (ref 26.0–34.0)
MCHC: 32.6 g/dL (ref 30.0–36.0)
MCV: 105 fL — ABNORMAL HIGH (ref 78.0–100.0)
Platelets: 220 10*3/uL (ref 150–400)
RBC: 3.01 MIL/uL — AB (ref 4.22–5.81)
RDW: 12 % (ref 11.5–15.5)
WBC: 14 10*3/uL — ABNORMAL HIGH (ref 4.0–10.5)

## 2017-03-29 LAB — PROCALCITONIN

## 2017-03-29 LAB — I-STAT TROPONIN, ED: TROPONIN I, POC: 0 ng/mL (ref 0.00–0.08)

## 2017-03-29 LAB — VITAMIN B12: VITAMIN B 12: 433 pg/mL (ref 180–914)

## 2017-03-29 LAB — BRAIN NATRIURETIC PEPTIDE: B Natriuretic Peptide: 119.9 pg/mL — ABNORMAL HIGH (ref 0.0–100.0)

## 2017-03-29 LAB — D-DIMER, QUANTITATIVE: D-Dimer, Quant: 1.2 ug/mL-FEU — ABNORMAL HIGH (ref 0.00–0.50)

## 2017-03-29 MED ORDER — ATORVASTATIN CALCIUM 40 MG PO TABS
40.0000 mg | ORAL_TABLET | Freq: Every day | ORAL | Status: DC
  Administered 2017-03-29 – 2017-04-08 (×11): 40 mg via ORAL
  Filled 2017-03-29 (×11): qty 1

## 2017-03-29 MED ORDER — DOCUSATE SODIUM 100 MG PO CAPS
100.0000 mg | ORAL_CAPSULE | Freq: Every day | ORAL | Status: DC
  Administered 2017-03-29 – 2017-04-09 (×12): 100 mg via ORAL
  Filled 2017-03-29 (×12): qty 1

## 2017-03-29 MED ORDER — GABAPENTIN 300 MG PO CAPS
600.0000 mg | ORAL_CAPSULE | Freq: Two times a day (BID) | ORAL | Status: DC
  Administered 2017-03-29 – 2017-04-09 (×23): 600 mg via ORAL
  Filled 2017-03-29 (×26): qty 2

## 2017-03-29 MED ORDER — ASPIRIN EC 81 MG PO TBEC
81.0000 mg | DELAYED_RELEASE_TABLET | Freq: Every day | ORAL | Status: DC
  Administered 2017-03-29 – 2017-04-09 (×11): 81 mg via ORAL
  Filled 2017-03-29 (×10): qty 1

## 2017-03-29 MED ORDER — DEXTROSE 5 % IV SOLN
500.0000 mg | Freq: Once | INTRAVENOUS | Status: AC
  Administered 2017-03-29: 500 mg via INTRAVENOUS
  Filled 2017-03-29 (×2): qty 500

## 2017-03-29 MED ORDER — LATANOPROST 0.005 % OP SOLN
1.0000 [drp] | Freq: Every day | OPHTHALMIC | Status: DC
  Administered 2017-03-29 – 2017-04-08 (×11): 1 [drp] via OPHTHALMIC
  Filled 2017-03-29: qty 2.5

## 2017-03-29 MED ORDER — HYDROCODONE-ACETAMINOPHEN 10-325 MG PO TABS
1.0000 | ORAL_TABLET | ORAL | Status: DC | PRN
  Administered 2017-03-29 – 2017-04-09 (×37): 1 via ORAL
  Filled 2017-03-29 (×38): qty 1

## 2017-03-29 MED ORDER — METHOCARBAMOL 500 MG PO TABS
750.0000 mg | ORAL_TABLET | Freq: Four times a day (QID) | ORAL | Status: DC | PRN
  Administered 2017-04-06: 750 mg via ORAL
  Filled 2017-03-29: qty 2

## 2017-03-29 MED ORDER — FUROSEMIDE 10 MG/ML IJ SOLN: 40.0000 mg | Freq: Two times a day (BID) | INTRAMUSCULAR | Status: DC

## 2017-03-29 MED ORDER — INSULIN ASPART 100 UNIT/ML ~~LOC~~ SOLN
0.0000 [IU] | Freq: Three times a day (TID) | SUBCUTANEOUS | Status: DC
  Administered 2017-03-29: 8 [IU] via SUBCUTANEOUS
  Administered 2017-03-29: 11 [IU] via SUBCUTANEOUS
  Administered 2017-03-29: 2 [IU] via SUBCUTANEOUS
  Administered 2017-03-30 (×2): 3 [IU] via SUBCUTANEOUS
  Administered 2017-03-31: 8 [IU] via SUBCUTANEOUS
  Administered 2017-03-31: 2 [IU] via SUBCUTANEOUS
  Administered 2017-03-31: 3 [IU] via SUBCUTANEOUS
  Administered 2017-03-31: 2 [IU] via SUBCUTANEOUS
  Administered 2017-04-01: 8 [IU] via SUBCUTANEOUS
  Administered 2017-04-01: 5 [IU] via SUBCUTANEOUS
  Administered 2017-04-01: 2 [IU] via SUBCUTANEOUS
  Administered 2017-04-02 – 2017-04-03 (×3): 3 [IU] via SUBCUTANEOUS
  Administered 2017-04-03: 2 [IU] via SUBCUTANEOUS
  Administered 2017-04-04: 3 [IU] via SUBCUTANEOUS
  Administered 2017-04-04: 5 [IU] via SUBCUTANEOUS
  Administered 2017-04-05 – 2017-04-06 (×3): 2 [IU] via SUBCUTANEOUS
  Administered 2017-04-07 (×2): 3 [IU] via SUBCUTANEOUS
  Administered 2017-04-07: 11 [IU] via SUBCUTANEOUS
  Administered 2017-04-08: 8 [IU] via SUBCUTANEOUS
  Administered 2017-04-08: 3 [IU] via SUBCUTANEOUS
  Administered 2017-04-08: 2 [IU] via SUBCUTANEOUS

## 2017-03-29 MED ORDER — FUROSEMIDE 10 MG/ML IJ SOLN
40.0000 mg | INTRAMUSCULAR | Status: AC
  Administered 2017-03-29: 40 mg via INTRAVENOUS
  Filled 2017-03-29: qty 4

## 2017-03-29 MED ORDER — ENOXAPARIN SODIUM 40 MG/0.4ML ~~LOC~~ SOLN
40.0000 mg | Freq: Every day | SUBCUTANEOUS | Status: DC
  Administered 2017-03-29 – 2017-04-05 (×8): 40 mg via SUBCUTANEOUS
  Filled 2017-03-29 (×8): qty 0.4

## 2017-03-29 MED ORDER — SODIUM CHLORIDE 0.9% FLUSH: 3.0000 mL | INTRAVENOUS | Status: DC | PRN

## 2017-03-29 MED ORDER — ONDANSETRON HCL 4 MG/2ML IJ SOLN: 4.0000 mg | Freq: Four times a day (QID) | INTRAMUSCULAR | Status: DC | PRN

## 2017-03-29 MED ORDER — IOPAMIDOL (ISOVUE-370) INJECTION 76%
INTRAVENOUS | Status: AC
  Administered 2017-03-29: 100 mL via INTRAVENOUS
  Filled 2017-03-29: qty 100

## 2017-03-29 MED ORDER — ACETAMINOPHEN 325 MG PO TABS: 650.0000 mg | ORAL_TABLET | ORAL | Status: DC | PRN

## 2017-03-29 MED ORDER — CEFTRIAXONE SODIUM 1 G IJ SOLR
1.0000 g | Freq: Once | INTRAMUSCULAR | Status: AC
  Administered 2017-03-29: 1 g via INTRAVENOUS
  Filled 2017-03-29: qty 10

## 2017-03-29 MED ORDER — FUROSEMIDE 10 MG/ML IJ SOLN
40.0000 mg | Freq: Two times a day (BID) | INTRAMUSCULAR | Status: DC
  Administered 2017-03-29: 40 mg via INTRAVENOUS
  Filled 2017-03-29: qty 4

## 2017-03-29 MED ORDER — FUROSEMIDE 40 MG PO TABS
40.0000 mg | ORAL_TABLET | Freq: Two times a day (BID) | ORAL | Status: DC
  Administered 2017-03-29 – 2017-04-02 (×8): 40 mg via ORAL
  Filled 2017-03-29 (×8): qty 1

## 2017-03-29 MED ORDER — HYDRALAZINE HCL 20 MG/ML IJ SOLN: 10.0000 mg | INTRAMUSCULAR | Status: DC | PRN

## 2017-03-29 MED ORDER — INSULIN ASPART 100 UNIT/ML ~~LOC~~ SOLN
0.0000 [IU] | Freq: Every day | SUBCUTANEOUS | Status: DC
  Administered 2017-04-06 – 2017-04-08 (×2): 2 [IU] via SUBCUTANEOUS

## 2017-03-29 MED ORDER — SODIUM CHLORIDE 0.9% FLUSH
3.0000 mL | Freq: Two times a day (BID) | INTRAVENOUS | Status: DC
  Administered 2017-03-29 – 2017-04-05 (×16): 3 mL via INTRAVENOUS

## 2017-03-29 MED ORDER — INSULIN DETEMIR 100 UNIT/ML ~~LOC~~ SOLN
40.0000 [IU] | Freq: Every day | SUBCUTANEOUS | Status: DC
  Administered 2017-03-29 – 2017-04-09 (×10): 40 [IU] via SUBCUTANEOUS
  Filled 2017-03-29 (×12): qty 0.4

## 2017-03-29 MED ORDER — SODIUM CHLORIDE 0.9 % IV SOLN: 250.0000 mL | INTRAVENOUS | Status: DC | PRN

## 2017-03-29 MED ORDER — SODIUM CHLORIDE 0.9 % IV SOLN
INTRAVENOUS | Status: AC
  Administered 2017-03-29: 21:00:00 via INTRAVENOUS

## 2017-03-29 MED ORDER — ISOSORB DINITRATE-HYDRALAZINE 20-37.5 MG PO TABS
1.0000 | ORAL_TABLET | Freq: Three times a day (TID) | ORAL | Status: DC
  Administered 2017-03-29 – 2017-04-04 (×19): 1 via ORAL
  Filled 2017-03-29 (×18): qty 1

## 2017-03-29 MED ORDER — METOPROLOL TARTRATE 25 MG PO TABS
25.0000 mg | ORAL_TABLET | Freq: Two times a day (BID) | ORAL | Status: DC
  Administered 2017-03-29 – 2017-04-01 (×8): 25 mg via ORAL
  Filled 2017-03-29 (×8): qty 1

## 2017-03-29 MED ORDER — SODIUM CHLORIDE 0.9 % IV BOLUS (SEPSIS)
500.0000 mL | Freq: Once | INTRAVENOUS | Status: AC
  Administered 2017-03-29: 500 mL via INTRAVENOUS

## 2017-03-29 MED ORDER — NITROGLYCERIN IN D5W 200-5 MCG/ML-% IV SOLN
0.0000 ug/min | INTRAVENOUS | Status: DC
  Administered 2017-03-29: 5 ug/min via INTRAVENOUS
  Filled 2017-03-29: qty 250

## 2017-03-29 MED ORDER — ALPRAZOLAM 0.5 MG PO TABS
1.0000 mg | ORAL_TABLET | Freq: Two times a day (BID) | ORAL | Status: DC | PRN
Start: 2017-03-29 — End: 2017-04-10
  Administered 2017-03-29: 1 mg via ORAL
  Administered 2017-03-30: 0.5 mg via ORAL
  Filled 2017-03-29 (×3): qty 2

## 2017-03-29 NOTE — Progress Notes (Signed)
Pt returned from CT. Notified from CT that they were unable to obtain scan. On call MD paged. New orders given. Will continue to monitor.

## 2017-03-29 NOTE — Progress Notes (Addendum)
Pt transported from ED B17 to 4E14 without any complications.

## 2017-03-29 NOTE — Progress Notes (Signed)
  PROGRESS NOTE  Patient admitted earlier this morning. See H&P. David [name] is a 71 y.o. male with medical history significant for hypertension, insulin-dependent diabetes mellitus, chronic back pain, chronic kidney disease stage II, and chronic systolic CHF who presented to the ED with dyspnea. Patient reports that he was in his usual state of health until he noted the insidious development of worsening dyspnea approximately 3 days ago.  He denies any significant edema, though his wife at the bedside reports that his ankles have been swollen recently. Patient woke up acutely dyspneic, took a BiDil as he says this often helps him feel better, did not experience any immediate relief, and called EMS  He is found to be saturating 74% on room air with EMS and was treated with BiPAP.   Patient with acute hypoxemic respiratory failure.  He states that he is feeling a little bit better since being admitted to the hospital.  He remains on BiPAP on my examination this morning.  -BNP only 119 and appears euvolemic on exam. Echo pending.  Continue Lasix at home dose.  -Procalcitonin < 0.10, although he does have elevated WBC with multifocal pneumonia on chest x-ray.  Continue Rocephin and Zithromax. -He was recommended for outpatient sleep study, which patient had refused.  I highly recommend that he follows up with a sleep study as he is at risk for OSA.  This may be a contributing factor to his respiratory failure as well.  His wife at bedside states that patient often wakes from his sleep gasping for air. -Check D dimer, rule out PE  -Spoke with RT regarding trial off BiPAP today    [name], DO [facility] [email or web address] Password [email or web address] 03/29/2017, 12:48 PM

## 2017-03-29 NOTE — Progress Notes (Signed)
Pt states that he feels fine and does not feel the need for the BiPAP machine because he is wearing oxygen.  RT will continue to monitor pt and will place the machine if needed or requested.

## 2017-03-29 NOTE — Progress Notes (Signed)
Pt arrived to 4e14. Pt placed on telemetry. Chg completed. Pt oriented to room. Pt on bipap. Plan of care reviewed with family. Will continue to monitor.

## 2017-03-29 NOTE — ED Provider Notes (Signed)
[facility] EMERGENCY DEPARTMENT Provider Note   CSN: [id number] Arrival date & time: 03/29/17  0055     History   Chief Complaint Chief Complaint  Patient presents with  . Shortness of Breath    HPI David [name] is a 71 y.o. male.  David [name] is a 71 y.o. Male with a history of CHF who presents to the ED complaining of sudden onset shortness of breath starting this evening.  Patient reports he has been having some increased shortness of breath of the past 3 days but is worse when he woke up this evening around 11:45 PM.  He was acutely worse when he woke up suddenly tonight. He reports he was acutely short of breath at this time.  Having trouble getting his breath.  EMS reports he had an oxygen saturation of 74% on room air upon their arrival.  He improved with oxygen and then had even greater improvement after being placed on CPAP.  Patient reports a history of CHF and reports he has been compliant with all of his medications-including his diuretics.  He does not use oxygen at home.  He denies any chest pain.  He denies fevers, abdominal pain, nausea, vomiting, diarrhea, leg pain, leg swelling, syncope or rashes.   The history is provided by the patient, medical records and the EMS personnel. No language interpreter was used.  Shortness of Breath  Associated symptoms include cough. Pertinent negatives include no fever, no headaches, no sore throat, no neck pain, no chest pain, no vomiting, no abdominal pain and no rash.    Past Medical History:  Diagnosis Date  . Arthritis   . CHF (congestive heart failure) ([place])   . DDD (degenerative disc disease), cervical   . Diabetes mellitus without complication ([place])    type II  . Diabetic neuropathy ([place])   . GERD (gastroesophageal reflux disease)   . Glaucoma   . Hypertension   . Renal disorder    Stage II    Patient Active Problem List   Diagnosis Date Noted  . Insulin-requiring or dependent type II  diabetes mellitus ([place]) 03/29/2017  . Acute on chronic systolic CHF (congestive heart failure) ([place]) 03/29/2017  . Acute on chronic respiratory failure with hypoxia ([place]) 03/29/2017  . Acute on chronic systolic (congestive) heart failure ([place]) 03/29/2017  . JVD (jugular venous distension)   . Pulmonary congestion   . DOE (dyspnea on exertion) 09/03/2016  . Acquired contracture of Achilles tendon, right 08/19/2016  . Amputated great toe, right ([place]) 07/18/2016  . Onychomycosis 05/29/2016  . Diabetic polyneuropathy associated with type 2 diabetes mellitus ([place]) 04/09/2016  . Sepsis due to cellulitis ([place]) 02/07/2015  . CKD (chronic kidney disease), stage II 02/07/2015  . Sepsis ([place]) 02/06/2015  . Unspecified hereditary and idiopathic peripheral neuropathy 09/15/2013  . Hypertension, uncontrolled 09/14/2013  . Diabetes mellitus ([place]) 09/14/2013  . Malaise 08/14/2013  . Intractable nausea and vomiting 08/14/2013  . Acute gastroenteritis 08/14/2013  . Hypertension 08/14/2013  . Chronic back pain 08/14/2013  . Glaucoma 08/14/2013  . Headache 08/14/2013  . Hypertensive urgency 08/14/2013    Past Surgical History:  Procedure Laterality Date  . BACK SURGERY     4  . CYST EXCISION     on Back  . JOINT REPLACEMENT    . TOTAL HIP ARTHROPLASTY     Right        Home Medications    Prior to Admission medications   Medication Sig Start  Date End Date Taking? Authorizing Provider  ALPRAZolam (XANAX) 1 MG tablet TAKE 1 TABLET BY MOUTH UP TO TWICE DAILY AS NEEDED FOR ANXEITY 06/17/16  Yes [provider]  aspirin EC 81 MG tablet Take 81 mg by mouth daily.   Yes [provider]  atorvastatin (LIPITOR) 40 MG tablet Take 40 mg by mouth at bedtime. 08/01/16  Yes [provider]  docusate sodium (COLACE) 100 MG capsule Take 100 mg by mouth daily.   Yes [provider]  furosemide (LASIX) 40 MG tablet Take 1 tablet (40 mg total) by mouth 2 (two) times daily.  Take twice daily for 5 days, and then daily after that Patient taking differently: Take 40 mg 2 (two) times daily by mouth.  09/08/16  Yes Ghimire, Henreitta Leber, MD  gabapentin (NEURONTIN) 600 MG tablet Take 600 mg by mouth 2 (two) times daily.  01/31/15  Yes [provider]  HYDROcodone-acetaminophen (NORCO) 10-325 MG tablet Take 1 tablet by mouth 2 (two) times daily as needed for moderate pain.  03/19/16  Yes [provider]  Insulin Detemir (LEVEMIR FLEXTOUCH) 100 UNIT/ML Pen Inject 32 Units into the skin daily. Patient taking differently: Inject 40 Units daily into the skin.  09/08/16  Yes Ghimire, Henreitta Leber, MD  isosorbide-hydrALAZINE (BIDIL) 20-37.5 MG tablet Take 1 tablet by mouth 3 (three) times daily. 09/08/16  Yes Ghimire, Shanker M, MD  LUMIGAN 0.01 % SOLN Place 1 drop into both eyes at bedtime. 07/28/16  Yes [provider]  methocarbamol (ROBAXIN) 750 MG tablet Take 1 tablet (750 mg total) by mouth every 6 (six) hours as needed for muscle spasms. 09/08/16  Yes Ghimire, Henreitta Leber, MD  metoprolol tartrate (LOPRESSOR) 25 MG tablet Take 1 tablet (25 mg total) by mouth 2 (two) times daily. 09/08/16  Yes Ghimire, Henreitta Leber, MD    Family History Family History  Problem Relation Age of Onset  . Diabetes Other   . Hyperlipidemia Other   . Hypertension Other   . Stroke Other   . Alzheimer's disease Other   . Thyroid disease Mother   . Diabetes Mellitus II Father     Social History Social History   Tobacco Use  . Smoking status: Never Smoker  . Smokeless tobacco: Never Used  Substance Use Topics  . Alcohol use: Yes    Comment: occ  . Drug use: No     Allergies   No known allergies   Review of Systems Review of Systems  Constitutional: Negative for chills and fever.  HENT: Negative for congestion and sore throat.   Eyes: Negative for visual disturbance.  Respiratory: Positive for cough and shortness of breath.   Cardiovascular: Negative for chest pain  and palpitations.  Gastrointestinal: Negative for abdominal pain, diarrhea, nausea and vomiting.  Genitourinary: Negative for dysuria.  Musculoskeletal: Negative for back pain and neck pain.  Skin: Negative for rash.  Neurological: Negative for headaches.     Physical Exam Updated Vital Signs BP (!) 172/78   Pulse 96   Temp 98 F (36.7 C) (Axillary)   Resp 17   SpO2 98%   Physical Exam  Constitutional: He appears well-developed and well-nourished.  Non-toxic appearance. He does not appear ill.  HENT:  Head: Normocephalic and atraumatic.  Mouth/Throat: Oropharynx is clear and moist.  Eyes: Conjunctivae are normal. Pupils are equal, round, and reactive to light. Right eye exhibits no discharge. Left eye exhibits no discharge.  Neck: Neck supple.  Cardiovascular: Regular rhythm, normal  heart sounds and intact distal pulses. Exam reveals no gallop and no friction rub.  No murmur heard. HR 112 on exam. Bilateral radial, posterior tibialis and dorsalis pedis pulses are intact.    Pulmonary/Chest: Accessory muscle usage present. Tachypnea noted. He has decreased breath sounds in the right lower field and the left lower field. He has no wheezes. He has no rales.  Abdominal: Soft. There is no tenderness.  Musculoskeletal: He exhibits no edema.       Right lower leg: He exhibits no tenderness.       Left lower leg: He exhibits no tenderness.  Bilateral ankle edema.  No calf edema or tenderness bilaterally.  Lymphadenopathy:    He has no cervical adenopathy.  Neurological: He is alert. Coordination normal.  Skin: Skin is warm and dry. Capillary refill takes less than 2 seconds. No rash noted. He is not diaphoretic. No erythema. No pallor.  Psychiatric: He has a normal mood and affect. His behavior is normal.  Nursing note and vitals reviewed.    ED Treatments / Results  Labs (all labs ordered are listed, but only abnormal results are displayed) Labs Reviewed  BASIC METABOLIC PANEL  - Abnormal; Notable for the following components:      Result Value   Glucose, Bld 289 (*)    Creatinine, Ser 1.32 (*)    Calcium 8.4 (*)    GFR calc non Af Amer 53 (*)    All other components within normal limits  CBC WITH DIFFERENTIAL/PLATELET - Abnormal; Notable for the following components:   WBC 17.2 (*)    RBC 3.44 (*)    Hemoglobin 11.8 (*)    HCT 36.2 (*)    MCV 105.2 (*)    MCH 34.3 (*)    Neutro Abs 14.6 (*)    Monocytes Absolute 1.3 (*)    All other components within normal limits  BRAIN NATRIURETIC PEPTIDE - Abnormal; Notable for the following components:   B Natriuretic Peptide 119.9 (*)    All other components within normal limits  I-STAT ARTERIAL BLOOD GAS, ED - Abnormal; Notable for the following components:   pO2, Arterial 162.0 (*)    All other components within normal limits  CULTURE, EXPECTORATED SPUTUM-ASSESSMENT  STREP PNEUMONIAE URINARY ANTIGEN  PROCALCITONIN  VITAMIN B12  FOLATE RBC  CBC  I-STAT TROPONIN, ED    EKG  EKG Interpretation  Date/Time:  Sunday March 29 2017 01:03:58 EST Ventricular Rate:  115 PR Interval:    QRS Duration: 80 QT Interval:  316 QTC Calculation: 437 R Axis:   68 Text Interpretation:  Sinus tachycardia Borderline repolarization abnormality Rate faster Confirmed by Ezequiel Essex [phone]) on 03/29/2017 1:18:12 AM       Radiology Dg Chest Port 1 View  Result Date: 03/29/2017 CLINICAL DATA:  Short of breath EXAM: PORTABLE CHEST 1 VIEW COMPARISON:  09/08/2016 FINDINGS: Mild cardiomegaly. Multifocal airspace disease in the lower lobes and left mid lung. No pleural effusion. No pneumothorax. IMPRESSION: 1. Multifocal airspace disease in the bilateral lung bases and left mid lung suspicious for multifocal pneumonia. 2. Mild cardiomegaly Electronically Signed   By: Donavan Foil M.D.   On: 03/29/2017 01:25    Procedures Procedures (including critical care time)  CRITICAL CARE Performed by: Hanley Hays   Total critical care time: 40 minutes  Critical care time was exclusive of separately billable procedures and treating other patients.  Critical care was necessary to treat or prevent imminent or life-threatening deterioration.  Critical care  was time spent personally by me on the following activities: development of treatment plan with patient and/or surrogate as well as nursing, discussions with consultants, evaluation of patient's response to treatment, examination of patient, obtaining history from patient or surrogate, ordering and performing treatments and interventions, ordering and review of laboratory studies, ordering and review of radiographic studies, pulse oximetry and re-evaluation of patient's condition.   Medications Ordered in ED Medications  cefTRIAXone (ROCEPHIN) 1 g in dextrose 5 % 50 mL IVPB (1 g Intravenous New Bag/Given 03/29/17 0214)  azithromycin (ZITHROMAX) 500 mg in dextrose 5 % 250 mL IVPB (not administered)  ALPRAZolam (XANAX) tablet 1 mg (not administered)  aspirin EC tablet 81 mg (not administered)  atorvastatin (LIPITOR) tablet 40 mg (not administered)  docusate sodium (COLACE) capsule 100 mg (not administered)  gabapentin (NEURONTIN) tablet 600 mg (not administered)  HYDROcodone-acetaminophen (NORCO) 10-325 MG per tablet 1 tablet (not administered)  isosorbide-hydrALAZINE (BIDIL) 20-37.5 MG per tablet 1 tablet (not administered)  latanoprost (XALATAN) 0.005 % ophthalmic solution 1 drop (not administered)  methocarbamol (ROBAXIN) tablet 750 mg (not administered)  metoprolol tartrate (LOPRESSOR) tablet 25 mg (not administered)  sodium chloride flush (NS) 0.9 % injection 3 mL (not administered)  sodium chloride flush (NS) 0.9 % injection 3 mL (not administered)  0.9 %  sodium chloride infusion (not administered)  acetaminophen (TYLENOL) tablet 650 mg (not administered)  ondansetron (ZOFRAN) injection 4 mg (not administered)  enoxaparin (LOVENOX)  injection 40 mg (not administered)  furosemide (LASIX) injection 40 mg (not administered)  insulin detemir (LEVEMIR) injection 40 Units (not administered)  insulin aspart (novoLOG) injection 0-15 Units (not administered)  insulin aspart (novoLOG) injection 0-5 Units (not administered)  furosemide (LASIX) injection 40 mg (40 mg Intravenous Given 03/29/17 0147)  sodium chloride 0.9 % bolus 500 mL (500 mLs Intravenous New Bag/Given 03/29/17 0147)     Initial Impression / Assessment and Plan / ED Course  I have reviewed the triage vital signs and the nursing notes.  Pertinent labs & imaging results that were available during my care of the patient were reviewed by me and considered in my medical decision making (see chart for details).    This is a 71 y.o. Male with a history of CHF who presents to the ED complaining of sudden onset shortness of breath starting this evening.  Patient reports he has been having some increased shortness of breath of the past 3 days but is worse when he woke up this evening around 11:45 PM.  He was acutely worse when he woke up suddenly tonight. He reports he was acutely short of breath at this time.  Having trouble getting his breath.  EMS reports he had an oxygen saturation of 74% on room air upon their arrival.  He improved with oxygen and then had even greater improvement after being placed on CPAP.  Patient reports a history of CHF and reports he has been compliant with all of his medications-including his diuretics.  He does not use oxygen at home.  He denies any chest pain.  On arrival to the emergency room the patient is nontoxic appearing.  He has increased work of breathing and is on CPAP by EMS.  He was transitioned to BiPAP in the emergency department immediately.  He has diminished lung sounds bilaterally with crackles noted to his bilateral bases.  Patient tells me he is feeling much better on BiPAP.  He has trace lower extremity edema.  He is  hypertensive. ABG is unremarkable. Troponin  is not elevated.  BMP shows a creatinine that is around his baseline.  CBC is remarkable for leukocytosis with a white count of 17,000.  BNP is 119. Chest x-ray shows multifocal airspace disease in the bilateral lung bases focal pneumonia.  Mild cardiomegaly.  Question if this is CHF versus community-acquired pneumonia.  He does have an elevated white count has had somewhat increasing shortness of breath over the past several days.  He also had rapid onset of increased shortness of breath that was improved with BiPAP.  Patient provided with Lasix and started on nitro drip for his blood pressure.  He responds to this well.  We will also cover with Rocephin and azithromycin for community-acquired pneumonia with his chest x-ray.  Plan for admission.  Patient and family agree with plan for admission. Patient still feeling much better on BiPAP at reevaluation.  I consulted with [facility] who accepted the patient for admission.  This patient was discussed with and evaluated by Dr. Wyvonnia Dusky who agrees with assessment and plan.  Final Clinical Impressions(s) / ED Diagnoses   Final diagnoses:  Community acquired pneumonia, unspecified laterality  Multifocal pneumonia  Acute on chronic congestive heart failure, unspecified heart failure type [facility])    ED Discharge Orders    None       Waynetta Pean, PA-C 03/29/17 0247    Ezequiel Essex, MD 03/30/17 [phone]

## 2017-03-29 NOTE — Progress Notes (Signed)
Patient came to CT Scan today to have a CTPA to evaluate for PE. After 100 cc injection of Isovue 370, scanner failed. Radiologist (Dr. Derrel Nip) was consulted regarding re-injecting the patient. He said to attempt CT again with only 75cc contrast. This was done and there was technical failure again. Dr. Maylene Roes was notified and given suggestion by radiologist to either do a VQ scan tonight if high probability of PE suspected or hydrate patient, re-check renal status in am and re-try the CTPA again.

## 2017-03-29 NOTE — ED Notes (Signed)
Admitting at [facility]idwe

## 2017-03-29 NOTE — ED Triage Notes (Signed)
Per EMS:  Pt presents to ED for assessment  After 3 days of SOB, but waking up tonight approx 1145p having acute respiratory distress.  74% on RA upon EMS arrival.  Tachypnic.  Pt 96% on 15L NRB, but still anxious, placed on CPAP en route.  EDP at bedside

## 2017-03-29 NOTE — H&P (Signed)
History and Physical    David [name]:[id number] DOB: 12-23-1945 DOA: 03/29/2017  PCP: Eloise Levels, NP (Inactive)   Patient coming from: Home  Chief Complaint: SOB  HPI: David [name] is a 71 y.o. male with medical history significant for hypertension, insulin-dependent diabetes mellitus, chronic back pain, chronic kidney disease stage II, and chronic systolic CHF, now presenting to the emergency department with 3 days of increased dyspnea and then acute worsening that woke him from sleep tonight.  Patient reports that he was in his usual state of health until he noted the insidious development of worsening dyspnea approximately 3 days ago.  He denies any significant edema, though his wife at the bedside reports that his ankles have been swollen recently.  Patient denies fevers or chills and denies any significant cough.  No wheezing, no leg tenderness or hemoptysis, and no rhinorrhea or sore throat.  Patient woke up acutely dyspneic, took a BiDil as he says this often helps him feel better, did not experience any immediate relief, and called EMS.  He is found to be saturating 74% on room air with EMS and was treated with CPAP.  ED Course: Upon arrival to the ED, patient is found to be afebrile, saturating well on BiPAP, tachypneic in the 30s, tachycardic in the 110s, and hypertensive to 170/110.  EKG features a sinus tachycardia with rate 115.  Chest x-ray is notable for multifocal airspace opacities in the bilateral bases and left mid lung suggestive of a multifocal pneumonia.  Chemistry panel reveals a creatinine of 1.32, consistent with his apparent baseline.  CBC is notable for a stable macrocytic anemia with hemoglobin of 11.8, and a leukocytosis to 17,200.  His troponin is undetectable and BNP is elevated to 120.  Patient was given 40 mg IV Lasix, started on nitroglycerin infusion, then given a 500 cc normal saline bolus and Rocephin plus azithromycin.  Blood pressure improved,  patient reported some subjective improvement, continues to be in respiratory distress, and will be admitted to the stepdown unit for ongoing evaluation and management of acute hypoxic respiratory failure, suspected secondary to acute on chronic systolic CHF, though there is a leukocytosis and asymmetric opacities on chest film raising concern for pneumonia.  Review of Systems:  All other systems reviewed and apart from HPI, are negative.  Past Medical History:  Diagnosis Date  . Arthritis   . CHF (congestive heart failure) ([place])   . DDD (degenerative disc disease), cervical   . Diabetes mellitus without complication ([place])    type II  . Diabetic neuropathy ([place])   . GERD (gastroesophageal reflux disease)   . Glaucoma   . Hypertension   . Renal disorder    Stage II    Past Surgical History:  Procedure Laterality Date  . BACK SURGERY     4  . CYST EXCISION     on Back  . JOINT REPLACEMENT    . TOTAL HIP ARTHROPLASTY     Right      reports that  has never smoked. he has never used smokeless tobacco. He reports that he drinks alcohol. He reports that he does not use drugs.  Allergies  Allergen Reactions  . No Known Allergies     Family History  Problem Relation Age of Onset  . Diabetes Other   . Hyperlipidemia Other   . Hypertension Other   . Stroke Other   . Alzheimer's disease Other   . Thyroid disease Mother   . Diabetes Mellitus  II Father      Prior to Admission medications   Medication Sig Start Date End Date Taking? Authorizing Provider  ALPRAZolam (XANAX) 1 MG tablet TAKE 1 TABLET BY MOUTH UP TO TWICE DAILY AS NEEDED FOR ANXEITY 06/17/16  Yes [provider]  aspirin EC 81 MG tablet Take 81 mg by mouth daily.   Yes [provider]  atorvastatin (LIPITOR) 40 MG tablet Take 40 mg by mouth at bedtime. 08/01/16  Yes [provider]  docusate sodium (COLACE) 100 MG capsule Take 100 mg by mouth daily.   Yes [provider]    furosemide (LASIX) 40 MG tablet Take 1 tablet (40 mg total) by mouth 2 (two) times daily. Take twice daily for 5 days, and then daily after that Patient taking differently: Take 40 mg 2 (two) times daily by mouth.  09/08/16  Yes Ghimire, Henreitta Leber, MD  gabapentin (NEURONTIN) 600 MG tablet Take 600 mg by mouth 2 (two) times daily.  01/31/15  Yes [provider]  HYDROcodone-acetaminophen (NORCO) 10-325 MG tablet Take 1 tablet by mouth 2 (two) times daily as needed for moderate pain.  03/19/16  Yes [provider]  Insulin Detemir (LEVEMIR FLEXTOUCH) 100 UNIT/ML Pen Inject 32 Units into the skin daily. Patient taking differently: Inject 40 Units daily into the skin.  09/08/16  Yes Ghimire, Henreitta Leber, MD  isosorbide-hydrALAZINE (BIDIL) 20-37.5 MG tablet Take 1 tablet by mouth 3 (three) times daily. 09/08/16  Yes Ghimire, Shanker M, MD  LUMIGAN 0.01 % SOLN Place 1 drop into both eyes at bedtime. 07/28/16  Yes [provider]  methocarbamol (ROBAXIN) 750 MG tablet Take 1 tablet (750 mg total) by mouth every 6 (six) hours as needed for muscle spasms. 09/08/16  Yes Ghimire, Henreitta Leber, MD  metoprolol tartrate (LOPRESSOR) 25 MG tablet Take 1 tablet (25 mg total) by mouth 2 (two) times daily. 09/08/16  Yes Jonetta Osgood, MD    Physical Exam: Vitals:   03/29/17 0105 03/29/17 0130 03/29/17 0145 03/29/17 0200  BP: (!) 165/115 (!) 169/76 (!) 177/80 (!) 172/78  Pulse: (!) 109 96 (!) 101 96  Resp: (!) 35 19 14 17   Temp: 98 F (36.7 C)     TempSrc: Axillary     SpO2: 99% 100% 100% 98%      Constitutional: No pallor or diaphoresis. In apparent discomfort.  Eyes: PERTLA, lids and conjunctivae normal ENMT: Mucous membranes are moist. Posterior pharynx clear of any exudate or lesions.   Neck: normal, supple, no masses, no thyromegaly Respiratory: Rales bilaterally. Tachypnea, dyspnea with speech. Increased WOB.   Cardiovascular: Rate ~110 and regular. Pretibial pitting edema  bilaterally. JVP 10 cmH20. Abdomen: No distension, no tenderness, no masses palpated. Bowel sounds normal.  Musculoskeletal: no clubbing / cyanosis. No joint deformity upper and lower extremities.   Skin: no significant rashes, lesions, ulcers. Warm, dry, well-perfused. Neurologic: CN 2-12 grossly intact. Sensation intact. Strength 5/5 in all 4 limbs.  Psychiatric: Alert and oriented x 3. Pleasant, cooperative.     Labs on Admission: I have personally reviewed following labs and imaging studies  CBC: Recent Labs  Lab 03/29/17 0102  WBC 17.2*  NEUTROABS 14.6*  HGB 11.8*  HCT 36.2*  MCV 105.2*  PLT 008   Basic Metabolic Panel: Recent Labs  Lab 03/29/17 0102  NA 137  K 4.0  CL 103  CO2 26  GLUCOSE 289*  BUN 19  CREATININE 1.32*  CALCIUM 8.4*   GFR: CrCl cannot  be calculated (Unknown ideal weight.). Liver Function Tests: No results for input(s): AST, ALT, ALKPHOS, BILITOT, PROT, ALBUMIN in the last 168 hours. No results for input(s): LIPASE, AMYLASE in the last 168 hours. No results for input(s): AMMONIA in the last 168 hours. Coagulation Profile: No results for input(s): INR, PROTIME in the last 168 hours. Cardiac Enzymes: No results for input(s): CKTOTAL, CKMB, CKMBINDEX, TROPONINI in the last 168 hours. BNP (last 3 results) No results for input(s): PROBNP in the last 8760 hours. HbA1C: No results for input(s): HGBA1C in the last 72 hours. CBG: No results for input(s): GLUCAP in the last 168 hours. Lipid Profile: No results for input(s): CHOL, HDL, LDLCALC, TRIG, CHOLHDL, LDLDIRECT in the last 72 hours. Thyroid Function Tests: No results for input(s): TSH, T4TOTAL, FREET4, T3FREE, THYROIDAB in the last 72 hours. Anemia Panel: No results for input(s): VITAMINB12, FOLATE, FERRITIN, TIBC, IRON, RETICCTPCT in the last 72 hours. Urine analysis:    Component Value Date/Time   COLORURINE STRAW (A) 09/04/2016 1233   APPEARANCEUR CLEAR 09/04/2016 1233   LABSPEC  1.006 09/04/2016 1233   PHURINE 5.0 09/04/2016 1233   GLUCOSEU NEGATIVE 09/04/2016 1233   HGBUR SMALL (A) 09/04/2016 1233   BILIRUBINUR NEGATIVE 09/04/2016 1233   KETONESUR NEGATIVE 09/04/2016 1233   PROTEINUR 100 (A) 09/04/2016 1233   UROBILINOGEN 1.0 02/13/2015 1934   NITRITE NEGATIVE 09/04/2016 1233   LEUKOCYTESUR NEGATIVE 09/04/2016 1233   Sepsis Labs: @LABRCNTIP (procalcitonin:4,lacticidven:4) )No results found for this or any previous visit (from the past 240 hour(s)).   Radiological Exams on Admission: Dg Chest Port 1 View  Result Date: 03/29/2017 CLINICAL DATA:  Short of breath EXAM: PORTABLE CHEST 1 VIEW COMPARISON:  09/08/2016 FINDINGS: Mild cardiomegaly. Multifocal airspace disease in the lower lobes and left mid lung. No pleural effusion. No pneumothorax. IMPRESSION: 1. Multifocal airspace disease in the bilateral lung bases and left mid lung suspicious for multifocal pneumonia. 2. Mild cardiomegaly Electronically Signed   By: Donavan Foil M.D.   On: 03/29/2017 01:25    EKG: Independently reviewed. Sinus tachycardia (rate 115).   Assessment/Plan  1. Acute hypoxic respiratory failure  - Pt presents with 3 days of increased dyspnea, then acute worsening that woke him from sleep  - EMS found him to have sat of 74% on rm air and he was transported to ED with CPAP  - Pt denies fevers, chills, or cough; noted to have leukocytosis and opacities on CXR concerning for PNA, though clinical picture more consistent with acute CHF  - He was treated with Rocephin and azithromycin in ED, also given Lasix and then a fluid bolus  - Plan to treat acute CHF initially, assess further with sputum culture and procalcitonin and consider continuing abx   2. Acute on chronic systolic CHF  - Pt presents with 3 days of increased dyspnea, now with acute worsening that woke him from sleep; found to have peripheral edema and JVD, consolidations on CXR felt to represent asymmetric edema given the  clinical picture - Managed at home with Lasix 40 BID, Bidil, beta-blocker  - Plan to continue cardiac monitoring, SLIV, fluid-restrict diet, follow daily wts and I/O's, diurese with Lasix 40 mg IV q12h, continue beta-blocker and Bidil, update echo, follow daily chem panel    3. Hypertension with hypertensive urgency   - BP 170/110 range in ED - Managed at home with Lopressor BID, will continue and give a dose now; continue Bidil - Anticipate improvement with diuresis, use hydralazine IVP's prn for now  4. Insulin-dependent DM - A1c was 7.7% in 2015 - Serum glucose is 289 on admission  - Managed at home with Levemir 40 units qD  - Check CBG's, continue Levemir 40 qD and start a SSI with Novolog    5. Chronic back pain  - Stable, following with NSG for elective surgery next month  - Continue home-regimen with prn Norco and Robaxin   6. Macrocytic anemia - Hgb is stable at 11.8, MCV elevated to 105.2  - No bleeding evident  - Check B12 and folate levels, and supplement as needed     DVT prophylaxis: Lovenox Code Status: Full  Family Communication: Wife updated at bedside Disposition Plan: Admit to SDU Consults called: None Admission status: Inpatient    Vianne Bulls, MD [facility] Pager [phone]  If 7PM-7AM, please contact night-coverage [email or web address] Password [email or web address]  03/29/2017, 2:32 AM

## 2017-03-30 ENCOUNTER — Inpatient Hospital Stay (HOSPITAL_COMMUNITY): Payer: Medicare Other

## 2017-03-30 ENCOUNTER — Encounter (HOSPITAL_COMMUNITY): Payer: Self-pay

## 2017-03-30 ENCOUNTER — Other Ambulatory Visit: Payer: Self-pay

## 2017-03-30 DIAGNOSIS — J189 Pneumonia, unspecified organism: Secondary | ICD-10-CM

## 2017-03-30 DIAGNOSIS — I5023 Acute on chronic systolic (congestive) heart failure: Secondary | ICD-10-CM

## 2017-03-30 LAB — CBC
HEMATOCRIT: 28.4 % — AB (ref 39.0–52.0)
HEMOGLOBIN: 9 g/dL — AB (ref 13.0–17.0)
MCH: 33.7 pg (ref 26.0–34.0)
MCHC: 31.7 g/dL (ref 30.0–36.0)
MCV: 106.4 fL — ABNORMAL HIGH (ref 78.0–100.0)
Platelets: 196 10*3/uL (ref 150–400)
RBC: 2.67 MIL/uL — ABNORMAL LOW (ref 4.22–5.81)
RDW: 12.2 % (ref 11.5–15.5)
WBC: 5.8 10*3/uL (ref 4.0–10.5)

## 2017-03-30 LAB — ECHOCARDIOGRAM COMPLETE
HEIGHTINCHES: 74 in
Weight: 3664 oz

## 2017-03-30 LAB — BASIC METABOLIC PANEL
ANION GAP: 4 — AB (ref 5–15)
BUN: 24 mg/dL — ABNORMAL HIGH (ref 6–20)
CO2: 29 mmol/L (ref 22–32)
Calcium: 7.9 mg/dL — ABNORMAL LOW (ref 8.9–10.3)
Chloride: 105 mmol/L (ref 101–111)
Creatinine, Ser: 1.59 mg/dL — ABNORMAL HIGH (ref 0.61–1.24)
GFR calc Af Amer: 49 mL/min — ABNORMAL LOW (ref >60)
GFR, EST NON AFRICAN AMERICAN: 43 mL/min — AB (ref >60)
GLUCOSE: 82 mg/dL (ref 65–99)
POTASSIUM: 3.9 mmol/L (ref 3.5–5.1)
Sodium: 138 mmol/L (ref 135–145)

## 2017-03-30 LAB — GLUCOSE, CAPILLARY
GLUCOSE-CAPILLARY: 83 mg/dL (ref 65–99)
GLUCOSE-CAPILLARY: 154 mg/dL — AB (ref 65–99)
Glucose-Capillary: 169 mg/dL — ABNORMAL HIGH (ref 65–99)
Glucose-Capillary: 157 mg/dL — ABNORMAL HIGH (ref 65–99)

## 2017-03-30 LAB — MRSA PCR SCREENING: MRSA by PCR: NEGATIVE

## 2017-03-30 LAB — FOLATE RBC
FOLATE, HEMOLYSATE: 361.1 ng/mL
Folate, RBC: 1128 ng/mL (ref >498)
Hematocrit: 32 % — ABNORMAL LOW (ref 37.5–51.0)

## 2017-03-30 MED ORDER — IPRATROPIUM-ALBUTEROL 0.5-2.5 (3) MG/3ML IN SOLN
3.0000 mL | Freq: Four times a day (QID) | RESPIRATORY_TRACT | Status: DC
  Administered 2017-03-30 – 2017-04-02 (×10): 3 mL via RESPIRATORY_TRACT
  Filled 2017-03-30 (×9): qty 3

## 2017-03-30 MED ORDER — SODIUM CHLORIDE 0.9 % IV SOLN: INTRAVENOUS | Status: DC

## 2017-03-30 MED ORDER — ALBUTEROL SULFATE (2.5 MG/3ML) 0.083% IN NEBU
2.5000 mg | INHALATION_SOLUTION | RESPIRATORY_TRACT | Status: DC | PRN
  Administered 2017-04-01 – 2017-04-04 (×3): 2.5 mg via RESPIRATORY_TRACT
  Filled 2017-03-30 (×4): qty 3

## 2017-03-30 MED ORDER — TECHNETIUM TC 99M DIETHYLENETRIAME-PENTAACETIC ACID
32.8000 | Freq: Once | INTRAVENOUS | Status: AC | PRN
  Administered 2017-03-30: 32.8 via RESPIRATORY_TRACT

## 2017-03-30 MED ORDER — TECHNETIUM TO 99M ALBUMIN AGGREGATED
4.3500 | Freq: Once | INTRAVENOUS | Status: AC | PRN
  Administered 2017-03-30: 4.35 via INTRAVENOUS

## 2017-03-30 NOTE — Progress Notes (Signed)
  Echocardiogram 2D Echocardiogram has been performed.  David [name] 03/30/2017, 9:14 AM

## 2017-03-30 NOTE — Progress Notes (Signed)
Dr. Maylene Roes made aware of patient sob and wheezing this AM orders received. Will monitor patient. Burma Ketcher, Bettina Gavia RN

## 2017-03-30 NOTE — Progress Notes (Signed)
PROGRESS NOTE    David [name]  NGE:[id number] DOB: 1945/09/16 DOA: 03/29/2017 PCP: Eloise Levels, NP (Inactive)     Brief Narrative:  David [name] a 71 y.o.malewith medical history significant forhypertension, insulin-dependent diabetes mellitus, chronic back pain, chronic kidney disease stage II, and chronic systolic CHF who presented to the ED with dyspnea. Patient reports that he was in his usual state of health until he noted the insidious development of worsening dyspnea approximately 3 days ago. He denies any significant edema, though his wife at the bedside reports that his ankles have been swollen recently.Patient woke up acutely dyspneic, took a BiDil as he says this often helps him feel better, did not experience any immediate relief, and called EMS He is found to be saturating 74% on room air with EMS and was treated with BiPAP.   Assessment & Plan:   Principal Problem:   Acute on chronic systolic CHF (congestive heart failure) (HCC) Active Problems:   Chronic back pain   Hypertensive urgency   CKD (chronic kidney disease), stage II   Insulin-requiring or dependent type II diabetes mellitus (HCC)   Acute on chronic respiratory failure with hypoxia (HCC)   Acute on chronic systolic (congestive) heart failure (H[facility])   Acute hypoxemic respiratory failure -Now off BiPAP -Multifactorial, initially concerned for acute heart failure, however BNP 119 and appeared euvolemic.  Echocardiogram completed today with normal EF and normal diastolic function.  Treatment for pneumonia as below.  He is recommended to complete outpatient sleep study to evaluate for OSA (previously refused).  CTA unable to be completed last night due to CT malfunction x2.  Obtain VQ scan to rule out PE.  Wells score is 3, intermediate probability with positive d-dimer. -Wean O2 as tolerated   Multifocal pneumonia -Procalcitonin < 0.1 but he had elevated WBC with multifocal pneumonia on CXR.  Continue Rocephin and Zithromax  Chronic diastolic heart failure -Follows with Dr. [name] -Previously EF 40-45% with global hypokinesis. Continue home lasix dose.   AKI on CKD stage 2 -In setting of contrast exposure  CAD -Continue aspirin, lipitor, bidil  HTN -Continue lopressor   DM type 2 -Levemir, SSI     DVT prophylaxis: lovenox Code Status: full Family Communication: wife at bedside Disposition Plan: pending improvement    Consultants:   None  Procedures:   None  Antimicrobials:  Anti-infectives (From admission, onward)   Start     Dose/Rate Route Frequency Ordered Stop   03/29/17 0200  cefTRIAXone (ROCEPHIN) 1 g in dextrose 5 % 50 mL IVPB     1 g 100 mL/hr over 30 Minutes Intravenous  Once 03/29/17 0149 03/29/17 0308   03/29/17 0200  azithromycin (ZITHROMAX) 500 mg in dextrose 5 % 250 mL IVPB     500 mg 250 mL/hr over 60 Minutes Intravenous  Once 03/29/17 0149 03/29/17 0459       Subjective: Feeling a little bit better today.  He states that he is very frustrated with being tied to the room.  He states that his breathing is better, has nonproductive cough.  No chest pain or worsening shortness of breath reported.  No nausea or vomiting.  Not sleeping overnight due to constant staff interruptions.  Objective: Vitals:   03/30/17 0454 03/30/17 0600 03/30/17 0952 03/30/17 1137  BP: (!) 146/88   (!) 153/69  Pulse: (!) 56 75  69  Resp: 19   15  Temp: 97.8 F (36.6 C)   98.6 F (37 C)  TempSrc: Oral  Oral  SpO2: 100% 95% 98% 100%  Weight:  103.9 kg (229 lb)    Height:        Intake/Output Summary (Last 24 hours) at 03/30/2017 1208 Last data filed at 03/30/2017 0800 Gross per 24 hour  Intake 1145 ml  Output 1700 ml  Net -555 ml   Filed Weights   03/29/17 0418 03/30/17 0600  Weight: 102.2 kg (225 lb 5 oz) 103.9 kg (229 lb)    Examination:  General exam: Appears calm and comfortable  Respiratory system: +Crackles bilaterally, Respiratory  effort normal. Cardiovascular system: S1 & S2 heard, RRR. No JVD, murmurs, rubs, gallops or clicks. No pedal edema. Gastrointestinal system: Abdomen is nondistended, soft and nontender. No organomegaly or masses felt. Normal bowel sounds heard. Central nervous system: Alert and oriented. No focal neurological deficits. Extremities: Symmetric 5 x 5 power. Skin: No rashes, lesions or ulcers Psychiatry: Judgement and insight appear normal. Mood & affect appropriate.   Data Reviewed: I have personally reviewed following labs and imaging studies  CBC: Recent Labs  Lab 03/29/17 0102 03/29/17 0245 03/30/17 0301  WBC 17.2* 14.0* 5.8  NEUTROABS 14.6*  --   --   HGB 11.8* 10.3* 9.0*  HCT 36.2* 31.6* 28.4*  MCV 105.2* 105.0* 106.4*  PLT 274 220 270   Basic Metabolic Panel: Recent Labs  Lab 03/29/17 0102 03/30/17 0301  NA 137 138  K 4.0 3.9  CL 103 105  CO2 26 29  GLUCOSE 289* 82  BUN 19 24*  CREATININE 1.32* 1.59*  CALCIUM 8.4* 7.9*   GFR: Estimated Creatinine Clearance: 56.4 mL/min (A) (by C-G formula based on SCr of 1.59 mg/dL (H)). Liver Function Tests: No results for input(s): AST, ALT, ALKPHOS, BILITOT, PROT, ALBUMIN in the last 168 hours. No results for input(s): LIPASE, AMYLASE in the last 168 hours. No results for input(s): AMMONIA in the last 168 hours. Coagulation Profile: No results for input(s): INR, PROTIME in the last 168 hours. Cardiac Enzymes: No results for input(s): CKTOTAL, CKMB, CKMBINDEX, TROPONINI in the last 168 hours. BNP (last 3 results) No results for input(s): PROBNP in the last 8760 hours. HbA1C: No results for input(s): HGBA1C in the last 72 hours. CBG: Recent Labs  Lab 03/29/17 1327 03/29/17 1609 03/29/17 2247 03/30/17 0607 03/30/17 1150  GLUCAP 273* 150* 84 83 169*   Lipid Profile: No results for input(s): CHOL, HDL, LDLCALC, TRIG, CHOLHDL, LDLDIRECT in the last 72 hours. Thyroid Function Tests: No results for input(s): TSH, T4TOTAL,  FREET4, T3FREE, THYROIDAB in the last 72 hours. Anemia Panel: Recent Labs    03/29/17 0102  [id number]   Sepsis Labs: Recent Labs  Lab 03/29/17 0102  PROCALCITON <0.10    Recent Results (from the past 240 hour(s))  MRSA PCR Screening     Status: None   Collection Time: 03/29/17 10:37 PM  Result Value Ref Range Status   MRSA by PCR NEGATIVE NEGATIVE Final    Comment:        The GeneXpert MRSA Assay (FDA approved for NASAL specimens only), is one component of a comprehensive MRSA colonization surveillance program. It is not intended to diagnose MRSA infection nor to guide or monitor treatment for MRSA infections.        Radiology Studies: Ct Angio Chest Pe W Or Wo Contrast  Result Date: 03/29/2017 CLINICAL DATA:  Shortness of breath.  Pneumonia. EXAM: CT ANGIOGRAPHY CHEST WITH CONTRAST TECHNIQUE: Multidetector CT imaging of the chest was performed using the standard protocol during  bolus administration of intravenous contrast. Multiplanar CT image reconstructions and MIPs were obtained to evaluate the vascular anatomy. CONTRAST:  197mL ISOVUE-370 IOPAMIDOL (ISOVUE-370) INJECTION 76% COMPARISON:  None. FINDINGS: Patient was given 100 mL Isovue-300 IV, although when scanner was triggered by contrast bolus, no images were produced due to scanner malfunction. A second attempt was made with 75 mL Isovue-300 IV after scanner rebooted. The same malfunction subsequently occurred as known images were obtained. Patient was returned to their room as patient's physician was notified by the [place] technologist. Patient's physician states V/Q scan has been ordered and will address patient hydration. IMPRESSION: As above. Electronically Signed   By: Marin Olp M.D.   On: 03/29/2017 21:43   Dg Chest Port 1 View  Result Date: 03/29/2017 CLINICAL DATA:  Short of breath EXAM: PORTABLE CHEST 1 VIEW COMPARISON:  09/08/2016 FINDINGS: Mild cardiomegaly. Multifocal airspace disease in the lower  lobes and left mid lung. No pleural effusion. No pneumothorax. IMPRESSION: 1. Multifocal airspace disease in the bilateral lung bases and left mid lung suspicious for multifocal pneumonia. 2. Mild cardiomegaly Electronically Signed   By: Donavan Foil M.D.   On: 03/29/2017 01:25      Scheduled Meds: . aspirin EC  81 mg Oral Daily  . atorvastatin  40 mg Oral q1800  . docusate sodium  100 mg Oral Daily  . enoxaparin (LOVENOX) injection  40 mg Subcutaneous Daily  . furosemide  40 mg Oral BID  . gabapentin  600 mg Oral BID  . insulin aspart  0-15 Units Subcutaneous TID WC  . insulin aspart  0-5 Units Subcutaneous QHS  . insulin detemir  40 Units Subcutaneous Daily  . ipratropium-albuterol  3 mL Nebulization Q6H  . isosorbide-hydrALAZINE  1 tablet Oral TID  . latanoprost  1 drop Both Eyes QHS  . metoprolol tartrate  25 mg Oral BID  . sodium chloride flush  3 mL Intravenous Q12H   Continuous Infusions: . sodium chloride       LOS: 1 day    Time spent: 30 minutes   [name], DO [facility] [email or web address] Password [email or web address] 03/30/2017, 12:08 PM

## 2017-03-30 NOTE — Evaluation (Signed)
Physical Therapy Evaluation Patient Details Name: David [name] MRN: [id number] DOB: 22-Apr-1946 Today's Date: 03/30/2017   History of Present Illness  71 y.o. male with medical history significant for hypertension, insulin-dependent diabetes mellitus, chronic back pain, chronic kidney disease stage II, and chronic systolic CHF who presented to the ED with dyspnea. Patient reports that he was in his usual state of health until he noted the insidious development of worsening dyspnea   Clinical Impression  Pt presents with deficits in strength, balance, gait and mobility and will benefit from skilled PT to address deficits and decrease burden of care for safe d/c home. Pt and wife were educated on PT recommendations for follow up PT as well as supervision/assisst with all mobility in and out of the home to reduce fall risk. Both verbalize understanding.    Follow Up Recommendations Outpatient PT;Supervision for mobility/OOB    Equipment Recommendations  None recommended by PT    Recommendations for Other Services       Precautions / Restrictions Precautions Precautions: Fall Restrictions Weight Bearing Restrictions: No      Mobility  Bed Mobility Overal bed mobility: Modified Independent             General bed mobility comments: mod I with use of bedrails  Transfers Overall transfer level: Needs assistance Equipment used: 1 person hand held assist Transfers: Sit to/from Stand Sit to Stand: Min assist         General transfer comment: mild trunkal ataxia, assist for anterior wt shift to stand  Ambulation/Gait Ambulation/Gait assistance: Min assist Ambulation Distance (Feet): 8 Feet Assistive device: 1 person hand held assist       General Gait Details: mild ataxia during gait, small step and stride length, assist to steady trunk during gait  Stairs            Wheelchair Mobility    Modified Rankin (Stroke Patients Only)       Balance Overall  balance assessment: Needs assistance   Sitting balance-Leahy Scale: Good       Standing balance-Leahy Scale: Poor Standing balance comment: standing at edge of bed pt requires min A to maintain static standing balance, cues for posture                             Pertinent Vitals/Pain Pain Assessment: No/denies pain    Home Living Family/patient expects to be discharged to:: Private residence Living Arrangements: Spouse/significant other Available Help at Discharge: Family Type of Home: House Home Access: Stairs to enter Entrance Stairs-Rails: Psychiatric nurse of Steps: 3 Home Layout: Two level;Bed/bath upstairs Home Equipment: Walker - 2 wheels;Cane - quad      Prior Function           Comments: walks with a quad cane, states multiple falls at home     Hand Dominance        Extremity/Trunk Assessment   Upper Extremity Assessment Upper Extremity Assessment: Generalized weakness    Lower Extremity Assessment Lower Extremity Assessment: Generalized weakness    Cervical / Trunk Assessment Cervical / Trunk Assessment: Normal  Communication   Communication: No difficulties  Cognition Arousal/Alertness: Awake/alert Behavior During Therapy: WFL for tasks assessed/performed Overall Cognitive Status: Within Functional Limits for tasks assessed  General Comments      Exercises     Assessment/Plan    PT Assessment Patient needs continued PT services  PT Problem List Decreased strength;Decreased balance;Decreased mobility;Decreased knowledge of use of DME;Cardiopulmonary status limiting activity;Decreased safety awareness;Decreased coordination;Decreased activity tolerance       PT Treatment Interventions DME instruction;Functional mobility training;Balance training;Patient/family education;Gait training;Therapeutic activities;Neuromuscular re-education;Buyer, retail;Therapeutic exercise    PT Goals (Current goals can be found in the Care Plan section)  Acute Rehab PT Goals Patient Stated Goal: be able to go to the gym PT Goal Formulation: With patient/family Time For Goal Achievement: 04/13/17 Potential to Achieve Goals: Good    Frequency Min 3X/week   Barriers to discharge Inaccessible home environment;Decreased caregiver support 2 level home, wife not present at all times    Co-evaluation               AM-PAC PT "6 Clicks" Daily Activity  Outcome Measure Difficulty turning over in bed (including adjusting bedclothes, sheets and blankets)?: None Difficulty moving from lying on back to sitting on the side of the bed? : A Little Difficulty sitting down on and standing up from a chair with arms (e.g., wheelchair, bedside commode, etc,.)?: A Lot Help needed moving to and from a bed to chair (including a wheelchair)?: A Lot Help needed walking in hospital room?: A Lot Help needed climbing 3-5 steps with a railing? : A Lot 6 Click Score: 15    End of Session Equipment Utilized During Treatment: Oxygen Activity Tolerance: Patient tolerated treatment well Patient left: in bed;with call bell/phone within reach;with bed alarm set;with family/visitor present Nurse Communication: Mobility status PT Visit Diagnosis: Unsteadiness on feet (R26.81);Muscle weakness (generalized) (M62.81);Difficulty in walking, not elsewhere classified (R26.2);Ataxic gait (R26.0);Repeated falls (R29.6);History of falling (Z91.81)    Time: 8850-2774 PT Time Calculation (min) (ACUTE ONLY): 20 min   Charges:   PT Evaluation $PT Eval Moderate Complexity: 1 Mod     PT G Codes:        David [name], PT, DPT  David [name] 03/30/2017, 1:40 PM

## 2017-03-31 DIAGNOSIS — I10 Essential (primary) hypertension: Secondary | ICD-10-CM

## 2017-03-31 DIAGNOSIS — E1122 Type 2 diabetes mellitus with diabetic chronic kidney disease: Secondary | ICD-10-CM

## 2017-03-31 DIAGNOSIS — N183 Chronic kidney disease, stage 3 (moderate): Secondary | ICD-10-CM

## 2017-03-31 LAB — BASIC METABOLIC PANEL
ANION GAP: 6 (ref 5–15)
BUN: 22 mg/dL — AB (ref 6–20)
CALCIUM: 8.4 mg/dL — AB (ref 8.9–10.3)
CO2: 29 mmol/L (ref 22–32)
Chloride: 104 mmol/L (ref 101–111)
Creatinine, Ser: 1.58 mg/dL — ABNORMAL HIGH (ref 0.61–1.24)
GFR calc Af Amer: 50 mL/min — ABNORMAL LOW (ref >60)
GFR, EST NON AFRICAN AMERICAN: 43 mL/min — AB (ref >60)
GLUCOSE: 126 mg/dL — AB (ref 65–99)
Potassium: 4.2 mmol/L (ref 3.5–5.1)
SODIUM: 139 mmol/L (ref 135–145)

## 2017-03-31 LAB — CBC
HCT: 29.9 % — ABNORMAL LOW (ref 39.0–52.0)
Hemoglobin: 9.5 g/dL — ABNORMAL LOW (ref 13.0–17.0)
MCH: 34.1 pg — ABNORMAL HIGH (ref 26.0–34.0)
MCHC: 31.8 g/dL (ref 30.0–36.0)
MCV: 107.2 fL — ABNORMAL HIGH (ref 78.0–100.0)
Platelets: 221 10*3/uL (ref 150–400)
RBC: 2.79 MIL/uL — ABNORMAL LOW (ref 4.22–5.81)
RDW: 12.2 % (ref 11.5–15.5)
WBC: 5.9 10*3/uL (ref 4.0–10.5)

## 2017-03-31 LAB — GLUCOSE, CAPILLARY
GLUCOSE-CAPILLARY: 269 mg/dL — AB (ref 65–99)
GLUCOSE-CAPILLARY: 195 mg/dL — AB (ref 65–99)
Glucose-Capillary: 144 mg/dL — ABNORMAL HIGH (ref 65–99)
Glucose-Capillary: 176 mg/dL — ABNORMAL HIGH (ref 65–99)

## 2017-03-31 MED ORDER — FLUTICASONE PROPIONATE 50 MCG/ACT NA SUSP
1.0000 | Freq: Every day | NASAL | Status: DC
  Administered 2017-03-31 – 2017-04-05 (×6): 1 via NASAL
  Filled 2017-03-31: qty 16

## 2017-03-31 MED ORDER — SODIUM CHLORIDE 0.9 % IV SOLN
INTRAVENOUS | Status: AC
  Administered 2017-03-31: 17:00:00 via INTRAVENOUS

## 2017-03-31 NOTE — Progress Notes (Signed)
Pt states that breathing is doing better, does not note much difference with nebs, refused HS neb.  Will change to PRN.  Pt aware.  RT continue to follow.

## 2017-03-31 NOTE — Progress Notes (Signed)
PROGRESS NOTE  QUINLAN VOLLMER ZOX:[id number] DOB: 1946-01-02 DOA: 03/29/2017 PCP: Eloise Levels, NP (Inactive)  HPI/Recap of past 73 hours: 71 year old male with medical history significant for hypertension, diabetes, CKD stage II, chronic systolic CHF, chronic back pain, presented to the ED with dyspnea for about 3 days.  Upon arrival of EMS patient was found to be saturating 74% on room air and was treated with BiPAP.  Today, patient noted to be resting comfortably on the bed, no increased work of breathing noted.  Patient denies any chest pain, worsening shortness of breath, abdominal pain, nausea/vomiting, dizziness.  Patient still frustrated about overall health, worries about having similar episode upon discharge.  Patient reassured  Assessment/Plan: Principal Problem:   Acute hypoxemic respiratory failure (HCC) Active Problems:   Hypertension   Chronic back pain   Hypertensive urgency   Diabetes mellitus ([place])   CKD (chronic kidney disease), stage II   Insulin-requiring or dependent type II diabetes mellitus ([place])   Pneumonia  #Acute hypoxemic respiratory failure Resolving Likely multifactorial, rule out viral infection Unlikely to be CHF as BNP 119 and echo with normal EF and normal diastolic function CT angiogram able to be completed due to malfunction CT.  VQ scan negative for PE Order respiratory viral panel Oxygen as needed PT/OT Monitor closely  #Multifocal pneumonia Unlikely, no symptoms, not ill-appearing Currently afebrile, no leukocytosis Initial chest x-ray showed possible multifocal pneumonia, repeat negative Discontinued IV Rocephin and Zithromax  #History of chronic diastolic heart failure Previous echo showed EF 40-45 with global hypokinesis Echo done on December admission showed normal EF with no diastolic dysfunction Continue home Lasix dose  #AK I on CKD stage II Likely due to contrast exposure We will give very gentle hydration as echo is  normal IV fluids at 75 cc/h for 1 L  #CAD Continue aspirin, Lipitor, bidil  #Hypertension Continue Lopressor  #Type II DM Continue Levemir, SSI      Code Status: Full  Family Communication: Wife at bedside  Disposition Plan: Pending improvement   Consultants:  None  Procedures:  None  Antimicrobials:  None  Status post IV ceftriaxone and azithromycin, discontinued  DVT prophylaxis: Lovenox   Objective: Vitals:   03/31/17 0500 03/31/17 0700 03/31/17 0827 03/31/17 1148  BP:  (!) 167/94 (!) 172/83 (!) 137/98  Pulse: (!) 110 (!) 25 81 (!) 106  Resp: (!) 0 19 (!) 28   Temp:   99.3 F (37.4 C)   TempSrc:   Oral   SpO2: 100% 100% 100%   Weight: 105.2 kg (231 lb 14.8 oz)     Height:        Intake/Output Summary (Last 24 hours) at 03/31/2017 1632 Last data filed at 03/31/2017 1300 Gross per 24 hour  Intake 240 ml  Output 600 ml  Net -360 ml   Filed Weights   03/29/17 0418 03/30/17 0600 03/31/17 0500  Weight: 102.2 kg (225 lb 5 oz) 103.9 kg (229 lb) 105.2 kg (231 lb 14.8 oz)    Exam:   General: Alert, awake, oriented x3  Cardiovascular: S1-S2 present, no added heart sounds  Respiratory: Chest clear bilaterally, no added sounds  Abdomen: Soft, nondistended, nontender  Musculoskeletal: Trace ankle edema on the right, none on the left  Skin: Normal  Psychiatry: Normal mood   Data Reviewed: CBC: Recent Labs  Lab 03/29/17 0102 03/29/17 0245 03/30/17 0301 03/31/17 0253  WBC 17.2* 14.0* 5.8 5.9  NEUTROABS 14.6*  --   --   --  HGB 11.8* 10.3* 9.0* 9.5*  HCT 36.2* 31.6*  32.0* 28.4* 29.9*  MCV 105.2* 105.0* 106.4* 107.2*  PLT 274 220 196 440   Basic Metabolic Panel: Recent Labs  Lab 03/29/17 0102 03/30/17 0301 03/31/17 0253  NA 137 138 139  K 4.0 3.9 4.2  CL 103 105 104  CO2 26 29 29   GLUCOSE 289* 82 126*  BUN 19 24* 22*  CREATININE 1.32* 1.59* 1.58*  CALCIUM 8.4* 7.9* 8.4*   GFR: Estimated Creatinine Clearance: 57  mL/min (A) (by C-G formula based on SCr of 1.58 mg/dL (H)). Liver Function Tests: No results for input(s): AST, ALT, ALKPHOS, BILITOT, PROT, ALBUMIN in the last 168 hours. No results for input(s): LIPASE, AMYLASE in the last 168 hours. No results for input(s): AMMONIA in the last 168 hours. Coagulation Profile: No results for input(s): INR, PROTIME in the last 168 hours. Cardiac Enzymes: No results for input(s): CKTOTAL, CKMB, CKMBINDEX, TROPONINI in the last 168 hours. BNP (last 3 results) No results for input(s): PROBNP in the last 8760 hours. HbA1C: No results for input(s): HGBA1C in the last 72 hours. CBG: Recent Labs  Lab 03/30/17 1150 03/30/17 1622 03/30/17 2053 03/31/17 0554 03/31/17 1143  GLUCAP 169* 154* 157* 144* 269*   Lipid Profile: No results for input(s): CHOL, HDL, LDLCALC, TRIG, CHOLHDL, LDLDIRECT in the last 72 hours. Thyroid Function Tests: No results for input(s): TSH, T4TOTAL, FREET4, T3FREE, THYROIDAB in the last 72 hours. Anemia Panel: Recent Labs    03/29/17 0102  VITAMINB12 433   Urine analysis:    Component Value Date/Time   COLORURINE STRAW (A) 09/04/2016 1233   APPEARANCEUR CLEAR 09/04/2016 1233   LABSPEC 1.006 09/04/2016 1233   PHURINE 5.0 09/04/2016 1233   GLUCOSEU NEGATIVE 09/04/2016 1233   HGBUR SMALL (A) 09/04/2016 1233   BILIRUBINUR NEGATIVE 09/04/2016 1233   KETONESUR NEGATIVE 09/04/2016 1233   PROTEINUR 100 (A) 09/04/2016 1233   UROBILINOGEN 1.0 02/13/2015 1934   NITRITE NEGATIVE 09/04/2016 1233   LEUKOCYTESUR NEGATIVE 09/04/2016 1233   Sepsis Labs: @LABRCNTIP (procalcitonin:4,lacticidven:4)  ) Recent Results (from the past 240 hour(s))  MRSA PCR Screening     Status: None   Collection Time: 03/29/17 10:37 PM  Result Value Ref Range Status   MRSA by PCR NEGATIVE NEGATIVE Final    Comment:        The GeneXpert MRSA Assay (FDA approved for NASAL specimens only), is one component of a comprehensive MRSA  colonization surveillance program. It is not intended to diagnose MRSA infection nor to guide or monitor treatment for MRSA infections.       Studies: No results found.  Scheduled Meds: . aspirin EC  81 mg Oral Daily  . atorvastatin  40 mg Oral q1800  . docusate sodium  100 mg Oral Daily  . enoxaparin (LOVENOX) injection  40 mg Subcutaneous Daily  . furosemide  40 mg Oral BID  . gabapentin  600 mg Oral BID  . insulin aspart  0-15 Units Subcutaneous TID WC  . insulin aspart  0-5 Units Subcutaneous QHS  . insulin detemir  40 Units Subcutaneous Daily  . ipratropium-albuterol  3 mL Nebulization Q6H  . isosorbide-hydrALAZINE  1 tablet Oral TID  . latanoprost  1 drop Both Eyes QHS  . metoprolol tartrate  25 mg Oral BID  . sodium chloride flush  3 mL Intravenous Q12H    Continuous Infusions: . sodium chloride       LOS: 2 days     Adline Peals  Horris Latino, MD [facility]  If 7PM-7AM, please contact night-coverage [email or web address] Password [email or web address] 03/31/2017, 4:32 PM

## 2017-03-31 NOTE — Evaluation (Addendum)
Occupational Therapy Evaluation Patient Details Name: David [name] MRN: [id number] DOB: April 21, 1946 Today's Date: 03/31/2017    History of Present Illness 71 y.o. male with medical history significant for hypertension, insulin-dependent diabetes mellitus, chronic back pain, chronic kidney disease stage II, and chronic systolic CHF who presented to the ED with dyspnea. Patient reports that he was in his usual state of health until he noted the insidious development of worsening dyspnea    Clinical Impression   PTA, pt reports independence with basic ADL and utilizing quad cane for functional mobility. He stays on the upper level of his home and only navigates steps if his wife is home due to previous falls. Pt currently requires min guard-min assist for ADL and functional mobility with RW. Pt with noted knee buckling when standing at the sink and significantly diminished activity tolerance for ADL. VSS throughout session on room air. He would benefit from continued OT services while admitted to improve independence and safety prior to returning home. At current functional level would recommend home health OT follow-up; however, will continue to update D/C recommendation according to pt progress.    Follow Up Recommendations  Home health OT;Supervision/Assistance - 24 hour(depending on progress)    Equipment Recommendations  3 in 1 bedside commode    Recommendations for Other Services       Precautions / Restrictions Precautions Precautions: Fall Restrictions Weight Bearing Restrictions: No      Mobility Bed Mobility               General bed mobility comments: Sitting at EOB on my arrival.   Transfers Overall transfer level: Needs assistance Equipment used: 1 person hand held assist Transfers: Sit to/from Stand Sit to Stand: Min assist;Min guard         General transfer comment: Assist to steady due to B knee buckling at times. Noted poor coordination with  functional mobility.     Balance Overall balance assessment: Needs assistance Sitting-balance support: No upper extremity supported;Feet supported Sitting balance-Leahy Scale: Good     Standing balance support: Bilateral upper extremity supported;During functional activity Standing balance-Leahy Scale: Poor Standing balance comment: Relied UE support during tasks standing at sink.                            ADL either performed or assessed with clinical judgement   ADL Overall ADL's : Needs assistance/impaired Eating/Feeding: Set up;Sitting   Grooming: Minimal assistance;Standing   Upper Body Bathing: Set up;Sitting   Lower Body Bathing: Min guard;Sit to/from stand   Upper Body Dressing : Set up;Sitting   Lower Body Dressing: Min guard;Sit to/from stand   Toilet Transfer: Min guard;Minimal assistance;Ambulation;RW Toilet Transfer Details (indicate cue type and reason): initially min guard assist but increased to min assist when fatigued after standing activity at sink. Toileting- Water quality scientist and Hygiene: Min guard;Sit to/from stand       Functional mobility during ADLs: Minimal assistance;Min guard;Rolling walker General ADL Comments: Pt fatigues easily. Required cues to stand upright at sink.      Vision   Vision Assessment?: No apparent visual deficits     Perception     Praxis      Pertinent Vitals/Pain Pain Assessment: 0-10 Pain Score: 6  Pain Location: back; chest; hips Pain Descriptors / Indicators: Aching Pain Intervention(s): Limited activity within patient's tolerance;Monitored during session;Repositioned(RN present and notified)     Hand Dominance Right   Extremity/Trunk Assessment Upper Extremity  Assessment Upper Extremity Assessment: Generalized weakness   Lower Extremity Assessment Lower Extremity Assessment: Generalized weakness   Cervical / Trunk Assessment Cervical / Trunk Assessment: Normal   Communication  Communication Communication: No difficulties   Cognition Arousal/Alertness: Awake/alert Behavior During Therapy: WFL for tasks assessed/performed Overall Cognitive Status: Within Functional Limits for tasks assessed                                     General Comments       Exercises     Shoulder Instructions      Home Living Family/patient expects to be discharged to:: Private residence Living Arrangements: Spouse/significant other Available Help at Discharge: Family(wife has private practice; she is with pt "most" of the time) Type of Home: House Home Access: Stairs to enter [employer] of Steps: 3 Entrance Stairs-Rails: Right;Left Home Layout: Two level;Bed/bath upstairs Alternate Level Stairs-Number of Steps: 12   Bathroom Shower/Tub: Teacher, early years/pre: Standard     Home Equipment: Environmental consultant - 2 wheels;Cane - quad   Additional Comments: Pt reports that he stays upstairs most of the time and only goes to main floor when wife is present      Prior Functioning/Environment Level of Independence: Independent with assistive device(s)        Comments: walks witha quad cane        OT Problem List: Decreased strength;Decreased activity tolerance;Impaired balance (sitting and/or standing);Decreased cognition;Decreased safety awareness;Decreased knowledge of use of DME or AE;Decreased knowledge of precautions;Pain      OT Treatment/Interventions: Self-care/ADL training;Therapeutic exercise;Energy conservation;DME and/or AE instruction;Therapeutic activities;Patient/family education;Balance training    OT Goals(Current goals can be found in the care plan section) Acute Rehab OT Goals Patient Stated Goal: feel better OT Goal Formulation: With patient Time For Goal Achievement: 04/14/17 Potential to Achieve Goals: Good ADL Goals Pt Will Perform Grooming: (P) with modified independence;standing Pt Will Perform Upper Body Dressing:  (P) sitting;with modified independence Pt Will Perform Lower Body Dressing: (P) with modified independence;sit to/from stand Pt Will Transfer to Toilet: (P) with modified independence;ambulating;regular height toilet;grab bars Pt Will Perform Toileting - Clothing Manipulation and hygiene: (P) with modified independence;sit to/from stand Pt Will Perform Tub/Shower Transfer: (P) Shower transfer;shower seat;ambulating;3 in 1;rolling walker  OT Frequency: Min 2X/week   Barriers to D/C:            Co-evaluation              AM-PAC PT "6 Clicks" Daily Activity     Outcome Measure Help from another person eating meals?: A Little Help from another person taking care of personal grooming?: A Little Help from another person toileting, which includes using toliet, bedpan, or urinal?: A Little Help from another person bathing (including washing, rinsing, drying)?: A Little Help from another person to put on and taking off regular upper body clothing?: A Little Help from another person to put on and taking off regular lower body clothing?: A Little 6 Click Score: 18   End of Session Equipment Utilized During Treatment: Gait belt;Rolling walker Nurse Communication: Mobility status;Other (comment)(encouraged increased mobility)  Activity Tolerance: Patient tolerated treatment well Patient left: in bed;with call bell/phone within reach;with family/visitor present(seated at EOB)  OT Visit Diagnosis: Other abnormalities of gait and mobility (R26.89);Pain;Muscle weakness (generalized) (M62.81) Pain - Right/Left: (bilateral) Pain - part of body: Hip(chest, back , hips)  Time: 0086-7619 OT Time Calculation (min): 27 min Charges:  OT General Charges $OT Visit: 1 Visit OT Evaluation $OT Eval Moderate Complexity: 1 Mod OT Treatments $Self Care/Home Management : 8-22 mins G-Codes:     Norman Herrlich, MS OTR/L  Pager: [place] A Rogan Ecklund 03/31/2017, 11:39 AM

## 2017-04-01 ENCOUNTER — Inpatient Hospital Stay (HOSPITAL_COMMUNITY): Payer: Medicare Other

## 2017-04-01 ENCOUNTER — Encounter (HOSPITAL_COMMUNITY)
Admission: EM | Disposition: A | Discharge status: Home / Self Care | Payer: Self-pay | Source: Home / Self Care | Attending: Internal Medicine

## 2017-04-01 DIAGNOSIS — M7989 Other specified soft tissue disorders: Secondary | ICD-10-CM

## 2017-04-01 DIAGNOSIS — R0602 Shortness of breath: Secondary | ICD-10-CM

## 2017-04-01 LAB — CBC
HEMATOCRIT: 27.3 % — AB (ref 39.0–52.0)
Hemoglobin: 8.7 g/dL — ABNORMAL LOW (ref 13.0–17.0)
MCH: 33.5 pg (ref 26.0–34.0)
MCHC: 31.9 g/dL (ref 30.0–36.0)
MCV: 105 fL — AB (ref 78.0–100.0)
Platelets: 209 10*3/uL (ref 150–400)
RBC: 2.6 MIL/uL — ABNORMAL LOW (ref 4.22–5.81)
RDW: 11.7 % (ref 11.5–15.5)
WBC: 5.8 10*3/uL (ref 4.0–10.5)

## 2017-04-01 LAB — BASIC METABOLIC PANEL
ANION GAP: 4 — AB (ref 5–15)
BUN: 19 mg/dL (ref 6–20)
CHLORIDE: 104 mmol/L (ref 101–111)
CO2: 30 mmol/L (ref 22–32)
Calcium: 8.1 mg/dL — ABNORMAL LOW (ref 8.9–10.3)
Creatinine, Ser: 1.44 mg/dL — ABNORMAL HIGH (ref 0.61–1.24)
GFR calc Af Amer: 56 mL/min — ABNORMAL LOW (ref >60)
GFR, EST NON AFRICAN AMERICAN: 48 mL/min — AB (ref >60)
GLUCOSE: 142 mg/dL — AB (ref 65–99)
POTASSIUM: 3.8 mmol/L (ref 3.5–5.1)
Sodium: 138 mmol/L (ref 135–145)

## 2017-04-01 LAB — HEMOGLOBIN A1C
Hgb A1c MFr Bld: 7.2 % — ABNORMAL HIGH (ref 4.8–5.6)
MEAN PLASMA GLUCOSE: 159.94 mg/dL

## 2017-04-01 LAB — TROPONIN I: Troponin I: <0.03 ng/mL (ref <0.03)

## 2017-04-01 LAB — GLUCOSE, CAPILLARY
GLUCOSE-CAPILLARY: 273 mg/dL — AB (ref 65–99)
Glucose-Capillary: 128 mg/dL — ABNORMAL HIGH (ref 65–99)
Glucose-Capillary: 206 mg/dL — ABNORMAL HIGH (ref 65–99)
Glucose-Capillary: 165 mg/dL — ABNORMAL HIGH (ref 65–99)

## 2017-04-01 SURGERY — LEFT HEART CATH AND CORONARY ANGIOGRAPHY
Anesthesia: LOCAL

## 2017-04-01 MED ORDER — LOSARTAN POTASSIUM 50 MG PO TABS
50.0000 mg | ORAL_TABLET | Freq: Every day | ORAL | Status: DC
  Administered 2017-04-01 – 2017-04-05 (×5): 50 mg via ORAL
  Filled 2017-04-01 (×5): qty 1

## 2017-04-01 NOTE — Progress Notes (Signed)
PROGRESS NOTE  David [name] XIP:[id number] DOB: 02-16-1946 DOA: 03/29/2017 PCP: Eloise Levels, NP (Inactive)  HPI/Recap of past 24 hours: 71 year old male with medical history significant for hypertension, diabetes, CKD stage II, chronic systolic CHF, chronic back pain, presented to the ED with dyspnea for about 3 days.  Upon arrival of EMS patient was found to be saturating 74% on room air and was treated with BiPAP.  Today, patient noted to be resting comfortably on the bed, no increased work of breathing noted.  Patient denies any chest pain, worsening shortness of breath, abdominal pain, nausea/vomiting, dizziness.  Patient able to ambulate today, but still SOB. Still anxious about overall health, worries about having similar episode upon discharge.  Assessment/Plan: Principal Problem:   Acute hypoxemic respiratory failure (HCC) Active Problems:   Hypertension   Chronic back pain   Hypertensive urgency   Diabetes mellitus ([place])   CKD (chronic kidney disease), stage II   Insulin-requiring or dependent type II diabetes mellitus ([place])   Pneumonia  #Acute hypoxemic respiratory failure Resolving Likely multifactorial, Hx of pulm HTN, rule out viral infection Unlikely to be CHF as BNP 119 and echo with normal EF and normal diastolic function CT angiogram able to be completed due to malfunction CT.  VQ scan negative for PE Doppler lower extremities negative for DVT Order respiratory viral panel pending Oxygen as needed PT/OT Monitor closely  #Multifocal pneumonia Unlikely, no symptoms, not ill-appearing Currently afebrile, no leukocytosis Initial chest x-ray showed possible multifocal pneumonia, repeat negative Discontinued IV Rocephin and Zithromax  #History of chronic diastolic heart failure Previous echo showed EF 40-45 with global hypokinesis Echo done on this admission showed normal EF with no diastolic dysfunction Continue home Lasix dose  #AK I on CKD stage  II Improving Likely due to contrast exposure We will give very gentle hydration as echo is normal IV fluids at 75 cc/h for 1 L  #CAD Continue aspirin, Lipitor, bidil  #Hypertension Continue Lopressor  #Type II DM Continue Levemir, SSI      Code Status: Full  Family Communication: Wife at bedside  Disposition Plan: Pending improvement   Consultants:  None  Procedures:  None  Antimicrobials:  None  Status post IV ceftriaxone and azithromycin, discontinued  DVT prophylaxis: Lovenox   Objective: Vitals:   03/31/17 2349 04/01/17 0332 04/01/17 0431 04/01/17 0930  BP: (!) 149/74  (!) 148/69   Pulse: 64  72 78  Resp: 17  12 14   Temp: 98.4 F (36.9 C)  98.2 F (36.8 C)   TempSrc: Oral  Oral   SpO2: 95% 99% 99% 99%  Weight:   104.8 kg (231 lb 1.6 oz)   Height:        Intake/Output Summary (Last 24 hours) at 04/01/2017 1343 Last data filed at 04/01/2017 0900 Gross per 24 hour  Intake 120 ml  Output 920 ml  Net -800 ml   Filed Weights   03/30/17 0600 03/31/17 0500 04/01/17 0431  Weight: 103.9 kg (229 lb) 105.2 kg (231 lb 14.8 oz) 104.8 kg (231 lb 1.6 oz)    Exam:   General: Alert, awake, oriented x3  Cardiovascular: S1-S2 present, no added heart sounds  Respiratory: Chest clear bilaterally, no added sounds  Abdomen: Soft, nondistended, nontender  Musculoskeletal: Trace ankle edema on the right, none on the left  Skin: Normal  Psychiatry: Normal mood   Data Reviewed: CBC: Recent Labs  Lab 03/29/17 0102 03/29/17 0245 03/30/17 0301 03/31/17 0253 04/01/17 0251  WBC 17.2*  14.0* 5.8 5.9 5.8  NEUTROABS 14.6*  --   --   --   --   HGB 11.8* 10.3* 9.0* 9.5* 8.7*  HCT 36.2* 31.6*  32.0* 28.4* 29.9* 27.3*  MCV 105.2* 105.0* 106.4* 107.2* 105.0*  PLT 274 220 196 221 606   Basic Metabolic Panel: Recent Labs  Lab 03/29/17 0102 03/30/17 0301 03/31/17 0253 04/01/17 0251  NA 137 138 139 138  K 4.0 3.9 4.2 3.8  CL 103 105 104 104   CO2 26 29 29 30   GLUCOSE 289* 82 126* 142*  BUN 19 24* 22* 19  CREATININE 1.32* 1.59* 1.58* 1.44*  CALCIUM 8.4* 7.9* 8.4* 8.1*   GFR: Estimated Creatinine Clearance: 62.5 mL/min (A) (by C-G formula based on SCr of 1.44 mg/dL (H)). Liver Function Tests: No results for input(s): AST, ALT, ALKPHOS, BILITOT, PROT, ALBUMIN in the last 168 hours. No results for input(s): LIPASE, AMYLASE in the last 168 hours. No results for input(s): AMMONIA in the last 168 hours. Coagulation Profile: No results for input(s): INR, PROTIME in the last 168 hours. Cardiac Enzymes: No results for input(s): CKTOTAL, CKMB, CKMBINDEX, TROPONINI in the last 168 hours. BNP (last 3 results) No results for input(s): PROBNP in the last 8760 hours. HbA1C: No results for input(s): HGBA1C in the last 72 hours. CBG: Recent Labs  Lab 03/31/17 1143 03/31/17 1632 03/31/17 2141 04/01/17 0608 04/01/17 1100  GLUCAP 269* 176* 195* 128* 206*   Lipid Profile: No results for input(s): CHOL, HDL, LDLCALC, TRIG, CHOLHDL, LDLDIRECT in the last 72 hours. Thyroid Function Tests: No results for input(s): TSH, T4TOTAL, FREET4, T3FREE, THYROIDAB in the last 72 hours. Anemia Panel: No results for input(s): VITAMINB12, FOLATE, FERRITIN, TIBC, IRON, RETICCTPCT in the last 72 hours. Urine analysis:    Component Value Date/Time   COLORURINE STRAW (A) 09/04/2016 1233   APPEARANCEUR CLEAR 09/04/2016 1233   LABSPEC 1.006 09/04/2016 1233   PHURINE 5.0 09/04/2016 1233   GLUCOSEU NEGATIVE 09/04/2016 1233   HGBUR SMALL (A) 09/04/2016 1233   BILIRUBINUR NEGATIVE 09/04/2016 1233   KETONESUR NEGATIVE 09/04/2016 1233   PROTEINUR 100 (A) 09/04/2016 1233   UROBILINOGEN 1.0 02/13/2015 1934   NITRITE NEGATIVE 09/04/2016 1233   LEUKOCYTESUR NEGATIVE 09/04/2016 1233   Sepsis Labs: @LABRCNTIP (procalcitonin:4,lacticidven:4)  ) Recent Results (from the past 240 hour(s))  MRSA PCR Screening     Status: None   Collection Time: 03/29/17  10:37 PM  Result Value Ref Range Status   MRSA by PCR NEGATIVE NEGATIVE Final    Comment:        The GeneXpert MRSA Assay (FDA approved for NASAL specimens only), is one component of a comprehensive MRSA colonization surveillance program. It is not intended to diagnose MRSA infection nor to guide or monitor treatment for MRSA infections.       Studies: No results found.  Scheduled Meds: . aspirin EC  81 mg Oral Daily  . atorvastatin  40 mg Oral q1800  . docusate sodium  100 mg Oral Daily  . enoxaparin (LOVENOX) injection  40 mg Subcutaneous Daily  . fluticasone  1 spray Each Nare Daily  . furosemide  40 mg Oral BID  . gabapentin  600 mg Oral BID  . insulin aspart  0-15 Units Subcutaneous TID WC  . insulin aspart  0-5 Units Subcutaneous QHS  . insulin detemir  40 Units Subcutaneous Daily  . ipratropium-albuterol  3 mL Nebulization Q6H  . isosorbide-hydrALAZINE  1 tablet Oral TID  . latanoprost  1  drop Both Eyes QHS  . metoprolol tartrate  25 mg Oral BID  . sodium chloride flush  3 mL Intravenous Q12H    Continuous Infusions: . sodium chloride       LOS: 3 days     Alma Friendly, MD [facility]  If 7PM-7AM, please contact night-coverage [email or web address] Password [email or web address] 04/01/2017, 1:43 PM

## 2017-04-01 NOTE — Progress Notes (Signed)
Preliminary results by tech - Venous Duplex Lower Ext. Completed. Negative for deep and superficial vein thrombosis. Sarayah Bacchi, BS, RDMS, RVT  

## 2017-04-01 NOTE — Progress Notes (Signed)
Physical Therapy Treatment Patient Details Name: David [name] MRN: [id number] DOB: [birth year] Today's Date: 04/01/2017    History of Present Illness Pt is a 71 y.o. male with PMH significant for HTN, DM, chronic back pain, CKD II, and chronic systolic CHF who presented to the ED on 03/29/17 with dyspnea; worked up for acute on chronic systolic CHF. Pt reports that he was in his usual state of health until he noted the insidious development of worsening dyspnea. Other PMH includes DDD, R THA.   PT Comments    Pt progressing well with mobility, but continues to demonstrate significant static and dynamic balance deficits. Wife present during session. Discussed benefits of outpatient PT to address these deficits upon d/c; both pt and wife in agreement, and wife will be able to provide transport. Will continue to follow acutely to maximize functional mobility and independence.    Follow Up Recommendations  Outpatient PT;Supervision for mobility/OOB     Equipment Recommendations  Standard walker    Recommendations for Other Services       Precautions / Restrictions Precautions Precautions: Fall Restrictions Weight Bearing Restrictions: No    Mobility  Bed Mobility Overal bed mobility: Independent                Transfers Overall transfer level: Needs assistance Equipment used: None;Rolling walker (2 wheeled);1 person hand held assist Transfers: Sit to/from Stand Sit to Stand: Min guard;Min assist         General transfer comment: Pt stood with no AD, immediately returning to sitting secondary to LOB and poor eccentric control. Able to stand again from bed with minA for HHA. Min guard to stand with RW  Ambulation/Gait Ambulation/Gait assistance: Min guard Ambulation Distance (Feet): 200 Feet Assistive device: None;Rolling walker (2 wheeled) Gait Pattern/deviations: Step-through pattern;Antalgic;Drifts right/left;Ataxic Gait velocity: Decreased Gait velocity  interpretation: <1.8 ft/sec, indicative of risk for recurrent falls General Gait Details: Amb to bathroom with intermittent HHA and min guard; pt with significant instability but no LOB; mild ataxic-type gait. Amb an additional 200' with RW and intermittent min guard as pt drifts R/L. SpO2 down to 90% on RA, returning to >95% with rest and deep breathing   Stairs            Wheelchair Mobility    Modified Rankin (Stroke Patients Only)       Balance Overall balance assessment: Needs assistance Sitting-balance support: No upper extremity supported;Feet supported Sitting balance-Leahy Scale: Good     Standing balance support: Bilateral upper extremity supported;During functional activity Standing balance-Leahy Scale: Poor Standing balance comment: Reliant on UE support for dynamic balance                            Cognition Arousal/Alertness: Awake/alert Behavior During Therapy: WFL for tasks assessed/performed Overall Cognitive Status: Within Functional Limits for tasks assessed                                        Exercises      General Comments General comments (skin integrity, edema, etc.): Wife present throughout session      Pertinent Vitals/Pain Pain Assessment: Faces Faces Pain Scale: Hurts even more Pain Location: Back, R hip Pain Descriptors / Indicators: Aching;Sore;Constant Pain Intervention(s): Monitored during session;Limited activity within patient's tolerance    Home Living  Prior Function            PT Goals (current goals can now be found in the care plan section) Acute Rehab PT Goals Patient Stated Goal: feel better PT Goal Formulation: With patient/family Time For Goal Achievement: 04/13/17 Potential to Achieve Goals: Good Progress towards PT goals: Progressing toward goals    Frequency    Min 3X/week      PT Plan Current plan remains appropriate    Co-evaluation               AM-PAC PT "6 Clicks" Daily Activity  Outcome Measure  Difficulty turning over in bed (including adjusting bedclothes, sheets and blankets)?: None Difficulty moving from lying on back to sitting on the side of the bed? : None Difficulty sitting down on and standing up from a chair with arms (e.g., wheelchair, bedside commode, etc,.)?: Unable Help needed moving to and from a bed to chair (including a wheelchair)?: A Little Help needed walking in hospital room?: A Little Help needed climbing 3-5 steps with a railing? : A Lot 6 Click Score: 17    End of Session Equipment Utilized During Treatment: Gait belt Activity Tolerance: Patient tolerated treatment well Patient left: in bed;with call bell/phone within reach;with family/visitor present Nurse Communication: Mobility status PT Visit Diagnosis: Unsteadiness on feet (R26.81);Muscle weakness (generalized) (M62.81);Difficulty in walking, not elsewhere classified (R26.2);Ataxic gait (R26.0);Repeated falls (R29.6);History of falling (Z91.81)     Time: 5102-5852 PT Time Calculation (min) (ACUTE ONLY): 18 min  Charges:  $Gait Training: 8-22 mins                    G Codes:      Mabeline Caras, PT, DPT Acute Rehab Services  Pager: [place] 04/01/2017, 12:32 PM

## 2017-04-01 NOTE — Consult Note (Addendum)
Reason for Consult: Shortness of breath Referring Physician: Dr. [name] is an 71 y.o. male.  HPI:   71 year old African-American male with uncontrolled type II diabetes mellitus, stage III chronic kidney disease, hypertension, hyperlipidemia, LVEF 40-45% with global hypokinesis on echocardiogram in 08/2016. He presented to the hospital on 03/29/2017 after an episode of shortness of breath that woke him up from sleep. He was found to be hypertensive, and has been since admission. His workup showed normal EF, no signifcicnt valvular dysfunction, no PE on VQ scan. He did receive contrast for CTA but had scanner malfunction. His EKG does not show any ischemic changes and troponin is negative.He had some improvement with nitroglycerin.   Prior to this episode, he denies any chest pain or shortness of breath on exertion, at baseline. He is compliant with his medical therapy, but is noncompliant with his diet. His wife states that he does not follow portion control and eats out a lot.   Past Medical History:  Diagnosis Date  . Arthritis   . CHF (congestive heart failure) ([place])   . DDD (degenerative disc disease), cervical   . Diabetes mellitus without complication ([place])    type II  . Diabetic neuropathy ([place])   . GERD (gastroesophageal reflux disease)   . Glaucoma   . Hypertension   . Renal disorder    Stage II    Past Surgical History:  Procedure Laterality Date  . BACK SURGERY     4  . CYST EXCISION     on Back  . JOINT REPLACEMENT    . TOTAL HIP ARTHROPLASTY     Right     Family History  Problem Relation Age of Onset  . Diabetes Other   . Hyperlipidemia Other   . Hypertension Other   . Stroke Other   . Alzheimer's disease Other   . Thyroid disease Mother   . Diabetes Mellitus II Father     Social History:  reports that  has never smoked. he has never used smokeless tobacco. He reports that he drinks alcohol. He reports that he does not use  drugs.  Allergies:  Allergies  Allergen Reactions  . No Known Allergies     Medications: I have reviewed the patient's current medications.   Results for orders placed or performed during the hospital encounter of 03/29/17 (from the past 48 hour(s))  Glucose, capillary     Status: Abnormal   Collection Time: 03/30/17  8:53 PM  Result Value Ref Range   Glucose-Capillary 157 (H) 65 - 99 mg/dL  Basic metabolic panel     Status: Abnormal   Collection Time: 03/31/17  2:53 AM  Result Value Ref Range   Sodium 139 135 - 145 mmol/L   Potassium 4.2 3.5 - 5.1 mmol/L   Chloride 104 101 - 111 mmol/L   CO2 29 22 - 32 mmol/L   Glucose, Bld 126 (H) 65 - 99 mg/dL   BUN 22 (H) 6 - 20 mg/dL   Creatinine, Ser 1.58 (H) 0.61 - 1.24 mg/dL   Calcium 8.4 (L) 8.9 - 10.3 mg/dL   GFR calc non Af Amer 43 (L) >60 mL/min   GFR calc Af Amer 50 (L) >60 mL/min    Comment: (NOTE) The eGFR has been calculated using the CKD EPI equation. This calculation has not been validated in all clinical situations. eGFR's persistently <60 mL/min signify possible Chronic Kidney Disease.    Anion gap 6 5 - 15  CBC  Status: Abnormal   Collection Time: 03/31/17  2:53 AM  Result Value Ref Range   WBC 5.9 4.0 - 10.5 K/uL   RBC 2.79 (L) 4.22 - 5.81 MIL/uL   Hemoglobin 9.5 (L) 13.0 - 17.0 g/dL   HCT 29.9 (L) 39.0 - 52.0 %   MCV 107.2 (H) 78.0 - 100.0 fL   MCH 34.1 (H) 26.0 - 34.0 pg   MCHC 31.8 30.0 - 36.0 g/dL   RDW 12.2 11.5 - 15.5 %   Platelets 221 150 - 400 K/uL  Glucose, capillary     Status: Abnormal   Collection Time: 03/31/17  5:54 AM  Result Value Ref Range   Glucose-Capillary 144 (H) 65 - 99 mg/dL  Glucose, capillary     Status: Abnormal   Collection Time: 03/31/17 11:43 AM  Result Value Ref Range   Glucose-Capillary 269 (H) 65 - 99 mg/dL   Comment 1 Notify RN   Glucose, capillary     Status: Abnormal   Collection Time: 03/31/17  4:32 PM  Result Value Ref Range   Glucose-Capillary 176 (H) 65 - 99  mg/dL   Comment 1 Notify RN   Glucose, capillary     Status: Abnormal   Collection Time: 03/31/17  9:41 PM  Result Value Ref Range   Glucose-Capillary 195 (H) 65 - 99 mg/dL  Basic metabolic panel     Status: Abnormal   Collection Time: 04/01/17  2:51 AM  Result Value Ref Range   Sodium 138 135 - 145 mmol/L   Potassium 3.8 3.5 - 5.1 mmol/L   Chloride 104 101 - 111 mmol/L   CO2 30 22 - 32 mmol/L   Glucose, Bld 142 (H) 65 - 99 mg/dL   BUN 19 6 - 20 mg/dL   Creatinine, Ser 1.44 (H) 0.61 - 1.24 mg/dL   Calcium 8.1 (L) 8.9 - 10.3 mg/dL   GFR calc non Af Amer 48 (L) >60 mL/min   GFR calc Af Amer 56 (L) >60 mL/min    Comment: (NOTE) The eGFR has been calculated using the CKD EPI equation. This calculation has not been validated in all clinical situations. eGFR's persistently <60 mL/min signify possible Chronic Kidney Disease.    Anion gap 4 (L) 5 - 15  CBC     Status: Abnormal   Collection Time: 04/01/17  2:51 AM  Result Value Ref Range   WBC 5.8 4.0 - 10.5 K/uL   RBC 2.60 (L) 4.22 - 5.81 MIL/uL   Hemoglobin 8.7 (L) 13.0 - 17.0 g/dL   HCT 27.3 (L) 39.0 - 52.0 %   MCV 105.0 (H) 78.0 - 100.0 fL   MCH 33.5 26.0 - 34.0 pg   MCHC 31.9 30.0 - 36.0 g/dL   RDW 11.7 11.5 - 15.5 %   Platelets 209 150 - 400 K/uL  Glucose, capillary     Status: Abnormal   Collection Time: 04/01/17  6:08 AM  Result Value Ref Range   Glucose-Capillary 128 (H) 65 - 99 mg/dL  Glucose, capillary     Status: Abnormal   Collection Time: 04/01/17 11:00 AM  Result Value Ref Range   Glucose-Capillary 206 (H) 65 - 99 mg/dL   Comment 1 Notify RN    Comment 2 Document in Chart   Glucose, capillary     Status: Abnormal   Collection Time: 04/01/17  3:49 PM  Result Value Ref Range   Glucose-Capillary 273 (H) 65 - 99 mg/dL   Comment 1 Notify RN  Comment 2 Document in Chart    Lexiscan myoview stress test 10/20/2016:  1. Resting EKG demonstrated normal sinus rhythm, normal axis, poor R-wave progression. Stress  EKG is nondiagnostic for ischemia as it is a pharmacologic stress test. Stress symptoms included dyspnea and chest pressure. 2.  The LV is dilated both at rest and stress images. The LV end diastolic volume was 631 mL. SPECT images demonstrate small perfusion abnormality of mild intensity in the basal inferior and mid inferior myocardial wall(s) on the stress images.  The defect is partially reversible on the resting images consistent with a mixture of myocardial infarction and ischemia.  The left ventricular ejection fraction was calculated or visually estimated to be 48% with global hypokinesis.  This is an intermediate risk study, clinical correlation recommended.  EKG 04/01/2017: Normal sinus rhythm LVH, LAE. First degree AV block.   Echocardiogram 03/30/2017: Study Conclusions  - Left ventricle: The cavity size was mildly dilated. There was   moderate concentric hypertrophy. Systolic function was normal.   The estimated ejection fraction was in the range of 55% to 60%.   Wall motion was normal; there were no regional wall motion   abnormalities. Left ventricular diastolic function parameters   were normal. - Aortic valve: Trileaflet; normal thickness, mildly calcified   leaflets. Valve area (VTI): 2.55 cm^2. Valve area (Vmax): 2.78   cm^2. Valve area (Vmean): 2.51 cm^2. - Mitral valve: Mildly calcified chordae tendinae. - Tricuspid valve: There was trivial regurgitation. - Pulmonic valve: There was trivial regurgitation. - Pulmonary arteries: Systolic pressure could not be accurately   estimated.    No results found.  Review of Systems  Constitutional: Positive for malaise/fatigue.  HENT: Negative.   Respiratory: Positive for shortness of breath. Negative for sputum production.   Cardiovascular: Positive for leg swelling and PND. Negative for chest pain, palpitations and claudication.  Gastrointestinal: Negative for abdominal pain.  Musculoskeletal: Negative.   Neurological:  Negative for loss of consciousness.  Psychiatric/Behavioral: Negative.   All other systems reviewed and are negative.  Blood pressure (!) 170/80, pulse 78, temperature 98.2 F (36.8 C), temperature source Oral, resp. rate 14, height 6' 2" (1.88 m), weight 104.8 kg (231 lb 1.6 oz), SpO2 99 %. Physical Exam  Nursing note and vitals reviewed. Constitutional: He is oriented to person, place, and time. He appears well-developed.  Mildly obese  HENT:  Head: Normocephalic and atraumatic.  Neck: JVD present.  Cardiovascular: Normal rate and regular rhythm.  No murmur (I/VI LLSB and apex) heard. Respiratory: Effort normal. He has rales.  GI: Soft. Bowel sounds are normal.  Musculoskeletal: He exhibits edema (2+ b/l).  Neurological: He is alert and oriented to person, place, and time.  Skin: Skin is warm.    Assessment: 71 y/o AAM w/uncontrolled hypertension, type 2 DM, CKD III, admitted with episode of shortness of breath  Shortness of breath: Likely an episode of orthopnea, PND in the setting of hypertensive urgency. No objective evidence of ischemia on EKG and cardiac biomarkers. CXR consistent with improving pulmonary congestion. He still has peripheral edema, JVD. Recommend aggressive blood pressure control and diuresis.  Hypertensive urgency Uncontrolled type 2 DM Prior abnormal stress test  Recommendations: Agree with lasix, perhaps add IV diuresis for next two days. He has at least 10lbs to diurese.  Add losartan 50 mg. Continue ASA/lipitor/metoprolol 25 mg bid/Bidil 20-37.5 tid. He is not on amlodipine here, somethibng he takes at home. However, I am okay foregoing amlodipine for losartan to achieve diuresis, blood  pressure control, as well as renal protection in the setting of uncontrolled DM. May add spironolactone in future.  Will check A1C. He has prior abnormal stress test with mild RCA territory ischemia, but I do not think unstable angina was the etiology of his presentation.  He does need a cath at some point to rule out CAD as a cause of his diastolic heart failure, but I would rather perform this after further optimization of his heart failure and hypertension. Especially given his overall congestion, CKD III, and recent use of IV contrast for CTA, his risk of CIN is higher than usual. We will plan on performing right and left heart cath either on Monday 11/19, if he is still inpatient, or as an outpatient elective procedure.   I have discussed the plan with patient's primary cardiologist Dr. [name] and he is in agreement. I have had a long talk with the patient regarding dietary compliance, especially calorie control and salt reduction.   Manish J Patwardhan 04/01/2017, 4:46 PM   [place], MD [facility] Pager: [phone] Office: [phone] If no answer Cell [phone]

## 2017-04-01 NOTE — Progress Notes (Signed)
Occupational Therapy Treatment Patient Details Name: David [name] MRN: [id number] DOB: [birth year] Today's Date: 04/01/2017    History of present illness Pt is David 71 y.o. male with PMH significant for HTN, DM, chronic back pain, CKD II, and chronic systolic CHF who presented to the ED on 03/29/17 with dyspnea; worked up for acute on chronic systolic CHF. Pt reports that he was in his usual state of health until he noted the insidious development of worsening dyspnea. Other PMH includes DDD, R THA,    OT comments  Pt demonstrating much improvement toward OT goals. He currently requires supervision for standing grooming tasks and min guard assist for ambulating toilet transfers. Pt demonstrating improving activity tolerance for ADL this session as well. Due to current progress, no longer feel pt will need OT follow-up post-acute D/C. However, will continue to follow while admitted.    Follow Up Recommendations  No OT follow up;Supervision/Assistance - 24 hour    Equipment Recommendations  3 in 1 bedside commode    Recommendations for Other Services      Precautions / Restrictions Precautions Precautions: Fall Restrictions Weight Bearing Restrictions: No       Mobility Bed Mobility               General bed mobility comments: Sitting at EOB on my arrival.   Transfers Overall transfer level: Needs assistance Equipment used: None;Rolling walker (2 wheeled) Transfers: Sit to/from Stand Sit to Stand: Min guard;Min assist         General transfer comment: Pt requiring VC's for hand placement during stand.     Balance Overall balance assessment: Needs assistance Sitting-balance support: No upper extremity supported;Feet supported Sitting balance-Leahy Scale: Good     Standing balance support: Bilateral upper extremity supported;During functional activity Standing balance-Leahy Scale: Poor Standing balance comment: Reliant on UE support for dynamic balance                            ADL either performed or assessed with clinical judgement   ADL Overall ADL's : Needs assistance/impaired     Grooming: Standing;Supervision/safety   Upper Body Bathing: Set up;Sitting               Toilet Transfer: Min guard;Ambulation;RW           Functional mobility during ADLs: Min guard General ADL Comments: Pt with improved activity tolerance this session. SpO2 stable on RA throughout session, HR up to 110 with pt reporting feeling as though it was racing, BP prior to activity 174/80 and 187/89 after activity.      Vision   Vision Assessment?: No apparent visual deficits   Perception     Praxis      Cognition Arousal/Alertness: Awake/alert Behavior During Therapy: WFL for tasks assessed/performed Overall Cognitive Status: Within Functional Limits for tasks assessed                                          Exercises     Shoulder Instructions       General Comments Wife present during session. Pt verbalizing worry over current situation.     Pertinent Vitals/ Pain       Pain Assessment: Faces Faces Pain Scale: Hurts little more Pain Location: Back, R hip Pain Descriptors / Indicators: Aching;Sore;Constant Pain Intervention(s): Monitored during session;RN gave pain meds  during session;Limited activity within patient's tolerance  Home Living                                          Prior Functioning/Environment              Frequency  Min 2X/week        Progress Toward Goals  OT Goals(current goals can now be found in the care plan section)  Progress towards OT goals: Progressing toward goals  Acute Rehab OT Goals Patient Stated Goal: feel better and figure out what is going on OT Goal Formulation: With patient Time For Goal Achievement: 04/14/17 Potential to Achieve Goals: Good  Plan Discharge plan needs to be updated    Co-evaluation                 AM-PAC  PT "6 Clicks" Daily Activity     Outcome Measure   Help from another person eating meals?: David Little Help from another person taking care of personal grooming?: David Little Help from another person toileting, which includes using toliet, bedpan, or urinal?: David Little Help from another person bathing (including washing, rinsing, drying)?: David Little Help from another person to put on and taking off regular upper body clothing?: David Little Help from another person to put on and taking off regular lower body clothing?: David Little 6 Click Score: 18    End of Session Equipment Utilized During Treatment: Gait belt;Rolling walker  OT Visit Diagnosis: Other abnormalities of gait and mobility (R26.89);Pain;Muscle weakness (generalized) (M62.81) Pain - Right/Left: Right Pain - part of body: Hip   Activity Tolerance Patient tolerated treatment well   Patient Left in bed;with call bell/phone within reach;with family/visitor present(seated at [facility])   Nurse Communication Mobility status        Time: 1610-9604 OT Time Calculation (min): 35 min  Charges: OT General Charges $OT Visit: 1 Visit OT Treatments $Self Care/Home Management : 23-37 mins  David Herrlich, MS OTR/L  Pager: [place] David [name] 04/01/2017, 5:05 PM

## 2017-04-02 ENCOUNTER — Encounter (HOSPITAL_COMMUNITY)
Admission: EM | Disposition: A | Discharge status: Home / Self Care | Payer: Self-pay | Source: Home / Self Care | Attending: Internal Medicine

## 2017-04-02 LAB — CBC
HCT: 30.4 % — ABNORMAL LOW (ref 39.0–52.0)
Hemoglobin: 9.7 g/dL — ABNORMAL LOW (ref 13.0–17.0)
MCH: 33.8 pg (ref 26.0–34.0)
MCHC: 31.9 g/dL (ref 30.0–36.0)
MCV: 105.9 fL — AB (ref 78.0–100.0)
Platelets: 236 10*3/uL (ref 150–400)
RBC: 2.87 MIL/uL — ABNORMAL LOW (ref 4.22–5.81)
RDW: 11.8 % (ref 11.5–15.5)
WBC: 6.9 10*3/uL (ref 4.0–10.5)

## 2017-04-02 LAB — BASIC METABOLIC PANEL
Anion gap: 8 (ref 5–15)
BUN: 15 mg/dL (ref 6–20)
CALCIUM: 8.5 mg/dL — AB (ref 8.9–10.3)
CHLORIDE: 102 mmol/L (ref 101–111)
CO2: 29 mmol/L (ref 22–32)
CREATININE: 1.46 mg/dL — AB (ref 0.61–1.24)
GFR calc Af Amer: 55 mL/min — ABNORMAL LOW (ref >60)
GFR calc non Af Amer: 47 mL/min — ABNORMAL LOW (ref >60)
GLUCOSE: 196 mg/dL — AB (ref 65–99)
Potassium: 3.9 mmol/L (ref 3.5–5.1)
Sodium: 139 mmol/L (ref 135–145)

## 2017-04-02 LAB — GLUCOSE, CAPILLARY
GLUCOSE-CAPILLARY: 81 mg/dL (ref 65–99)
GLUCOSE-CAPILLARY: 177 mg/dL — AB (ref 65–99)
GLUCOSE-CAPILLARY: 73 mg/dL (ref 65–99)
Glucose-Capillary: 197 mg/dL — ABNORMAL HIGH (ref 65–99)

## 2017-04-02 SURGERY — LEFT HEART CATH AND CORONARY ANGIOGRAPHY
Anesthesia: LOCAL

## 2017-04-02 MED ORDER — FUROSEMIDE 10 MG/ML IJ SOLN
40.0000 mg | Freq: Every day | INTRAMUSCULAR | Status: DC
  Administered 2017-04-02 – 2017-04-04 (×3): 40 mg via INTRAVENOUS
  Filled 2017-04-02 (×3): qty 4

## 2017-04-02 MED ORDER — METOPROLOL SUCCINATE ER 50 MG PO TB24
50.0000 mg | ORAL_TABLET | Freq: Every day | ORAL | Status: DC
  Administered 2017-04-02 – 2017-04-04 (×3): 50 mg via ORAL
  Filled 2017-04-02 (×3): qty 1

## 2017-04-02 MED ORDER — FUROSEMIDE 10 MG/ML IJ SOLN
20.0000 mg | Freq: Once | INTRAMUSCULAR | Status: AC
  Administered 2017-04-02: 20 mg via INTRAVENOUS
  Filled 2017-04-02: qty 2

## 2017-04-02 MED ORDER — IPRATROPIUM-ALBUTEROL 0.5-2.5 (3) MG/3ML IN SOLN
3.0000 mL | Freq: Two times a day (BID) | RESPIRATORY_TRACT | Status: DC
  Administered 2017-04-02 – 2017-04-05 (×7): 3 mL via RESPIRATORY_TRACT
  Filled 2017-04-02 (×11): qty 3

## 2017-04-02 MED ORDER — FUROSEMIDE 10 MG/ML IJ SOLN: 40.0000 mg | Freq: Two times a day (BID) | INTRAMUSCULAR | Status: DC

## 2017-04-02 NOTE — H&P (View-Only) (Signed)
Subjective:  One episode of shortness of breath and chest tightness last night. EKG and trop negative for iscehmia. Diuresing well. BP still elevated.  Objective:  Vital Signs in the last 24 hours: Temp:  [97.8 F (36.6 C)-98.2 F (36.8 C)] 97.8 F (36.6 C) (11/15 0359) Pulse Rate:  [25-78] 73 (11/15 0359) Resp:  [14-18] 18 (11/15 0359) BP: (158-181)/(80-102) 172/92 (11/15 0359) SpO2:  [87 %-100 %] 100 % (11/15 0429) Weight:  [104.6 kg (230 lb 8 oz)] 104.6 kg (230 lb 8 oz) (11/15 0359)  Intake/Output from previous day: 11/14 0701 - 11/15 0700 In: 1080 [P.O.:1080] Out: 2320 [Urine:2320] Intake/Output from this shift: No intake/output data recorded.   Net -3.5 L this hospitalization  Physical Exam: Nursing note and vitals reviewed. Constitutional: He is oriented to person, place, and time. He appears well-developed.  Mildly obese  HENT:  Head: Normocephalic and atraumatic.  Neck: JVD present.  Cardiovascular: Normal rate and regular rhythm.  No murmur (I/VI LLSB and apex) heard. Respiratory: Effort normal. He has no rales.  GI: Soft. Bowel sounds are normal.  Musculoskeletal: He exhibits edema (2+ b/l).  Neurological: He is alert and oriented to person, place, and time.  Skin: Skin is warm.      Lab Results: Recent Labs    03/31/17 0253 04/01/17 0251  WBC 5.9 5.8  HGB 9.5* 8.7*  PLT 221 209   Recent Labs    03/31/17 0253 04/01/17 0251  NA 139 138  K 4.2 3.8  CL 104 104  CO2 29 30  GLUCOSE 126* 142*  BUN 22* 19  CREATININE 1.58* 1.44*   Recent Labs    04/01/17 1521  TROPONINI <0.03    Imaging: CXR 03/30/2017 CLINICAL DATA:  O current chest complaints. History of CHF, diabetes, hypertension. Also history of respiratory failure.  EXAM: CHEST  2 VIEW  COMPARISON:  Chest x-ray of March 29, 2017  FINDINGS: The lungs are adequately inflated. The interstitial markings are less prominent today. The pulmonary vascularity is less engorged  and more distinct. The heart is top-normal in size. There is calcification in the wall of the aortic arch. There is no pleural effusion. The bony thorax exhibits no acute abnormality.  IMPRESSION: Findings compatible with improving CHF. There is no alveolar pneumonia.  Thoracic aortic atherosclerosis.  Cardiac Studies: Lexiscan myoview stress test 10/20/2016:  1. Resting EKG demonstrated normal sinus rhythm, normal axis, poor R-wave progression. Stress EKG is nondiagnostic for ischemia as it is a pharmacologic stress test. Stress symptoms included dyspnea and chest pressure. 2. The LV is dilated both at rest and stress images. The LV end diastolic volume was 967 mL. SPECT images demonstrate small perfusion abnormality of mild intensity in the basal inferior and mid inferior myocardial wall(s) on the stress images. The defect is partially reversible on the resting images consistent with a mixture of myocardial infarction and ischemia. The left ventricular ejection fraction was calculated or visually estimated to be 48% with global hypokinesis. This is an intermediate risk study, clinical correlation recommended.  EKG 04/01/2017: Normal sinus rhythm LVH, LAE. First degree AV block.   Echocardiogram 03/30/2017: Study Conclusions  - Left ventricle: The cavity size was mildly dilated. There was moderate concentric hypertrophy. Systolic function was normal. The estimated ejection fraction was in the range of 55% to 60%. Wall motion was normal; there were no regional wall motion abnormalities. Left ventricular diastolic function parameters were normal. - Aortic valve: Trileaflet; normal thickness, mildly calcified leaflets. Valve area (VTI): 2.55  cm^2. Valve area (Vmax): 2.78 cm^2. Valve area (Vmean): 2.51 cm^2. - Mitral valve: Mildly calcified chordae tendinae. - Tricuspid valve: There was trivial regurgitation. - Pulmonic valve: There was trivial regurgitation. -  Pulmonary arteries: Systolic pressure could not be accurately estimated.   Assessment: 71 y/o AAM w/uncontrolled hypertension, type 2 DM, CKD III, admitted with episode of shortness of breath  Shortness of breath: Likely an episode of orthopnea, PND in the setting of hypertensive urgency. No objective evidence of ischemia on EKG and cardiac biomarkers. Echocardiogram does not show profound diastolic dysfunction, however, clinical exam, CXR consistent with volume overload and decompensated heart failure. This is likely precipitated by uncontrolled hypertension. His BNP is relatively low, likely due to obesity.  Hypertensive urgency Uncontrolled type 2 DM Prior abnormal stress test  Recommendations: Agree with IV lasix 20 mg this morning. He is diuresing well. Continue 40 mg PO lasix today Continue losartan 50 mg. I have switched metoprolol tartarate 25 mg bid to metoprolol succinate 50 mg once daily.   Continue ASA/lipitor/Bidil 20-37.5 tid. May add spironolactone in future if blood pressure still uncontrolled. If resistant hypertension in spite of maximal medical therapy, may need to consider renal artery stenosis as a differential. Will check BMP today. He has prior abnormal stress test with mild RCA territory ischemia, but I do not think unstable angina was the etiology of his presentation. He does need a cath at some point to rule out CAD as a cause of his diastolic heart failure, but I would rather perform this after further optimization of his heart failure and hypertension. Especially given his overall congestion, CKD III, and recent use of IV contrast for CTA, his risk of CIN is higher than usual. We will plan on performing right and left heart cath either on Monday 11/19, if he is still inpatient, or as an outpatient elective procedure.   I have discussed the plan with patient's primary cardiologist Dr. [name] and he is in agreement. I have had a long talk with the patient regarding  dietary compliance, especially calorie control and salt reduction.     LOS: 4 days    David [name] J Shoshana Johal 04/02/2017, 8:46 AM  [place], MD [facility] [place] Pager: [phone] Office: [phone] If no answer Cell [phone]

## 2017-04-02 NOTE — Care Management Important Message (Signed)
Important Message  Patient Details  Name: David [name] MRN: [id number] Date of Birth: [birth year]   Medicare Important Message Given:  Yes    David [name] 04/02/2017, 10:09 AM

## 2017-04-02 NOTE — Progress Notes (Signed)
Subjective:  One episode of shortness of breath and chest tightness last night. EKG and trop negative for iscehmia. Diuresing well. BP still elevated.  Objective:  Vital Signs in the last 24 hours: Temp:  [97.8 F (36.6 C)-98.2 F (36.8 C)] 97.8 F (36.6 C) (11/15 0359) Pulse Rate:  [25-78] 73 (11/15 0359) Resp:  [14-18] 18 (11/15 0359) BP: (158-181)/(80-102) 172/92 (11/15 0359) SpO2:  [87 %-100 %] 100 % (11/15 0429) Weight:  [104.6 kg (230 lb 8 oz)] 104.6 kg (230 lb 8 oz) (11/15 0359)  Intake/Output from previous day: 11/14 0701 - 11/15 0700 In: 1080 [P.O.:1080] Out: 2320 [Urine:2320] Intake/Output from this shift: No intake/output data recorded.   Net -3.5 L this hospitalization  Physical Exam: Nursing note and vitals reviewed. Constitutional: He is oriented to person, place, and time. He appears well-developed.  Mildly obese  HENT:  Head: Normocephalic and atraumatic.  Neck: JVD present.  Cardiovascular: Normal rate and regular rhythm.  No murmur (I/VI LLSB and apex) heard. Respiratory: Effort normal. He has no rales.  GI: Soft. Bowel sounds are normal.  Musculoskeletal: He exhibits edema (2+ b/l).  Neurological: He is alert and oriented to person, place, and time.  Skin: Skin is warm.      Lab Results: Recent Labs    03/31/17 0253 04/01/17 0251  WBC 5.9 5.8  HGB 9.5* 8.7*  PLT 221 209   Recent Labs    03/31/17 0253 04/01/17 0251  NA 139 138  K 4.2 3.8  CL 104 104  CO2 29 30  GLUCOSE 126* 142*  BUN 22* 19  CREATININE 1.58* 1.44*   Recent Labs    04/01/17 1521  TROPONINI <0.03    Imaging: CXR 03/30/2017 CLINICAL DATA:  O current chest complaints. History of CHF, diabetes, hypertension. Also history of respiratory failure.  EXAM: CHEST  2 VIEW  COMPARISON:  Chest x-ray of March 29, 2017  FINDINGS: The lungs are adequately inflated. The interstitial markings are less prominent today. The pulmonary vascularity is less engorged  and more distinct. The heart is top-normal in size. There is calcification in the wall of the aortic arch. There is no pleural effusion. The bony thorax exhibits no acute abnormality.  IMPRESSION: Findings compatible with improving CHF. There is no alveolar pneumonia.  Thoracic aortic atherosclerosis.  Cardiac Studies: Lexiscan myoview stress test 10/20/2016:  1. Resting EKG demonstrated normal sinus rhythm, normal axis, poor R-wave progression. Stress EKG is nondiagnostic for ischemia as it is a pharmacologic stress test. Stress symptoms included dyspnea and chest pressure. 2. The LV is dilated both at rest and stress images. The LV end diastolic volume was 938 mL. SPECT images demonstrate small perfusion abnormality of mild intensity in the basal inferior and mid inferior myocardial wall(s) on the stress images. The defect is partially reversible on the resting images consistent with a mixture of myocardial infarction and ischemia. The left ventricular ejection fraction was calculated or visually estimated to be 48% with global hypokinesis. This is an intermediate risk study, clinical correlation recommended.  EKG 04/01/2017: Normal sinus rhythm LVH, LAE. First degree AV block.   Echocardiogram 03/30/2017: Study Conclusions  - Left ventricle: The cavity size was mildly dilated. There was moderate concentric hypertrophy. Systolic function was normal. The estimated ejection fraction was in the range of 55% to 60%. Wall motion was normal; there were no regional wall motion abnormalities. Left ventricular diastolic function parameters were normal. - Aortic valve: Trileaflet; normal thickness, mildly calcified leaflets. Valve area (VTI): 2.55  cm^2. Valve area (Vmax): 2.78 cm^2. Valve area (Vmean): 2.51 cm^2. - Mitral valve: Mildly calcified chordae tendinae. - Tricuspid valve: There was trivial regurgitation. - Pulmonic valve: There was trivial regurgitation. -  Pulmonary arteries: Systolic pressure could not be accurately estimated.   Assessment: 71 y/o AAM w/uncontrolled hypertension, type 2 DM, CKD III, admitted with episode of shortness of breath  Shortness of breath: Likely an episode of orthopnea, PND in the setting of hypertensive urgency. No objective evidence of ischemia on EKG and cardiac biomarkers. Echocardiogram does not show profound diastolic dysfunction, however, clinical exam, CXR consistent with volume overload and decompensated heart failure. This is likely precipitated by uncontrolled hypertension. His BNP is relatively low, likely due to obesity.  Hypertensive urgency Uncontrolled type 2 DM Prior abnormal stress test  Recommendations: Agree with IV lasix 20 mg this morning. He is diuresing well. Continue 40 mg PO lasix today Continue losartan 50 mg. I have switched metoprolol tartarate 25 mg bid to metoprolol succinate 50 mg once daily.   Continue ASA/lipitor/Bidil 20-37.5 tid. May add spironolactone in future if blood pressure still uncontrolled. If resistant hypertension in spite of maximal medical therapy, may need to consider renal artery stenosis as a differential. Will check BMP today. He has prior abnormal stress test with mild RCA territory ischemia, but I do not think unstable angina was the etiology of his presentation. He does need a cath at some point to rule out CAD as a cause of his diastolic heart failure, but I would rather perform this after further optimization of his heart failure and hypertension. Especially given his overall congestion, CKD III, and recent use of IV contrast for CTA, his risk of CIN is higher than usual. We will plan on performing right and left heart cath either on Monday 11/19, if he is still inpatient, or as an outpatient elective procedure.   I have discussed the plan with patient's primary cardiologist Dr. [name] and he is in agreement. I have had a long talk with the patient regarding  dietary compliance, especially calorie control and salt reduction.     LOS: 4 days    Marrietta Thunder J Kemon Devincenzi 04/02/2017, 8:46 AM  [place], MD [facility] [place] Pager: [phone] Office: [phone] If no answer Cell [phone]

## 2017-04-02 NOTE — Progress Notes (Addendum)
PROGRESS NOTE  David [name] VOH:[id number] DOB: [birth year] DOA: 03/29/2017 PCP: Eloise Levels, NP (Inactive)  HPI/Recap of past 73 hours: 71 year old male with medical history significant for hypertension, diabetes, CKD stage II, chronic systolic CHF, chronic back pain, presented to the ED with dyspnea for about 3 days.  Upon arrival of EMS patient was found to be saturating 74% on room air and was treated with BiPAP.  Today, patient noted to be resting comfortably on the bed, no increased work of breathing noted.  Patient denies any chest pain, worsening shortness of breath, abdominal pain, nausea/vomiting, dizziness.  Patient able to ambulate today, but still reports episodes of SOB.  Assessment/Plan: Principal Problem:   Acute hypoxemic respiratory failure (HCC) Active Problems:   Hypertension   Chronic back pain   Hypertensive urgency   Diabetes mellitus ([place])   CKD (chronic kidney disease), stage II   Insulin-requiring or dependent type II diabetes mellitus (HCC)   Pneumonia  #Acute hypoxemic respiratory failure Resolving Likely multifactorial, ?? CHF exacerbation, Hx of pulm HTN, rule out viral infection BNP 119, Echo with normal EF and normal diastolic function CT angiogram couldn't be completed due to malfunction CT.  VQ scan negative for PE Doppler lower extremities negative for DVT Oxygen as needed PT/OT Monitor closely  #Multifocal pneumonia Unlikely, no symptoms, not ill-appearing Currently afebrile, no leukocytosis Flu panel neg Initial chest x-ray showed possible multifocal pneumonia, repeat CXR negative Discontinued IV Rocephin and Zithromax  #History of chronic diastolic heart failure Previous echo showed EF 40-45 with global hypokinesis Echo done on this admission showed normal EF with no diastolic dysfunction Cardiology on board IV lasix 40mg  daily, losartan, metoprolol, bidil  #AK I on CKD stage III Improving Likely due to contrast  exposure S/p 1L of IVF on 11/13 Daily BMP  #CAD Continue aspirin, Lipitor, bidil  #Hypertension Losartan, bidil, Lopressor changed to Toprol  #Type II DM Continue Levemir, SSI      Code Status: Full  Family Communication: Wife at bedside  Disposition Plan: Pending improvement   Consultants:  Cardiology  Procedures:  None  Antimicrobials:  None  Status post IV ceftriaxone and azithromycin, discontinued  DVT prophylaxis: Lovenox   Objective: Vitals:   04/02/17 0359 04/02/17 0429 04/02/17 0911 04/02/17 1014  BP: (!) 172/92   (!) 151/80  Pulse: 73  67 72  Resp: 18  20   Temp: 97.8 F (36.6 C)     TempSrc: Axillary     SpO2: 100% 100% 100%   Weight: 104.6 kg (230 lb 8 oz)     Height:        Intake/Output Summary (Last 24 hours) at 04/02/2017 1438 Last data filed at 04/02/2017 1018 Gross per 24 hour  Intake 723 ml  Output 2320 ml  Net -1597 ml   Filed Weights   03/31/17 0500 04/01/17 0431 04/02/17 0359  Weight: 105.2 kg (231 lb 14.8 oz) 104.8 kg (231 lb 1.6 oz) 104.6 kg (230 lb 8 oz)    Exam:   General: Alert, awake, oriented x3  Cardiovascular: S1-S2 present, no added heart sounds  Respiratory: Chest clear bilaterally, no added sounds  Abdomen: Soft, nondistended, nontender  Musculoskeletal: Trace ankle edema on the right, none on the left  Skin: Normal  Psychiatry: Normal mood   Data Reviewed: CBC: Recent Labs  Lab 03/29/17 0102 03/29/17 0245 03/30/17 0301 03/31/17 0253 04/01/17 0251 04/02/17 0926  WBC 17.2* 14.0* 5.8 5.9 5.8 6.9  NEUTROABS 14.6*  --   --   --   --   --  HGB 11.8* 10.3* 9.0* 9.5* 8.7* 9.7*  HCT 36.2* 31.6*  32.0* 28.4* 29.9* 27.3* 30.4*  MCV 105.2* 105.0* 106.4* 107.2* 105.0* 105.9*  PLT 274 220 196 221 209 287   Basic Metabolic Panel: Recent Labs  Lab 03/29/17 0102 03/30/17 0301 03/31/17 0253 04/01/17 0251 04/02/17 0926  NA 137 138 139 138 139  K 4.0 3.9 4.2 3.8 3.9  CL 103 105 104 104 102   CO2 26 29 29 30 29   GLUCOSE 289* 82 126* 142* 196*  BUN 19 24* 22* 19 15  CREATININE 1.32* 1.59* 1.58* 1.44* 1.46*  CALCIUM 8.4* 7.9* 8.4* 8.1* 8.5*   GFR: Estimated Creatinine Clearance: 61.6 mL/min (A) (by C-G formula based on SCr of 1.46 mg/dL (H)). Liver Function Tests: No results for input(s): AST, ALT, ALKPHOS, BILITOT, PROT, ALBUMIN in the last 168 hours. No results for input(s): LIPASE, AMYLASE in the last 168 hours. No results for input(s): AMMONIA in the last 168 hours. Coagulation Profile: No results for input(s): INR, PROTIME in the last 168 hours. Cardiac Enzymes: Recent Labs  Lab 04/01/17 1521  TROPONINI <0.03   BNP (last 3 results) No results for input(s): PROBNP in the last 8760 hours. HbA1C: Recent Labs    04/01/17 1850  HGBA1C 7.2*   CBG: Recent Labs  Lab 04/01/17 1100 04/01/17 1549 04/01/17 2215 04/02/17 0614 04/02/17 1214  GLUCAP 206* 273* 165* 81 177*   Lipid Profile: No results for input(s): CHOL, HDL, LDLCALC, TRIG, CHOLHDL, LDLDIRECT in the last 72 hours. Thyroid Function Tests: No results for input(s): TSH, T4TOTAL, FREET4, T3FREE, THYROIDAB in the last 72 hours. Anemia Panel: No results for input(s): VITAMINB12, FOLATE, FERRITIN, TIBC, IRON, RETICCTPCT in the last 72 hours. Urine analysis:    Component Value Date/Time   COLORURINE STRAW (A) 09/04/2016 1233   APPEARANCEUR CLEAR 09/04/2016 1233   LABSPEC 1.006 09/04/2016 1233   PHURINE 5.0 09/04/2016 1233   GLUCOSEU NEGATIVE 09/04/2016 1233   HGBUR SMALL (A) 09/04/2016 1233   BILIRUBINUR NEGATIVE 09/04/2016 1233   KETONESUR NEGATIVE 09/04/2016 1233   PROTEINUR 100 (A) 09/04/2016 1233   UROBILINOGEN 1.0 02/13/2015 1934   NITRITE NEGATIVE 09/04/2016 1233   LEUKOCYTESUR NEGATIVE 09/04/2016 1233   Sepsis Labs: @LABRCNTIP (procalcitonin:4,lacticidven:4)  ) Recent Results (from the past 240 hour(s))  MRSA PCR Screening     Status: None   Collection Time: 03/29/17 10:37 PM  Result  Value Ref Range Status   MRSA by PCR NEGATIVE NEGATIVE Final    Comment:        The GeneXpert MRSA Assay (FDA approved for NASAL specimens only), is one component of a comprehensive MRSA colonization surveillance program. It is not intended to diagnose MRSA infection nor to guide or monitor treatment for MRSA infections.       Studies: No results found.  Scheduled Meds: . aspirin EC  81 mg Oral Daily  . atorvastatin  40 mg Oral q1800  . docusate sodium  100 mg Oral Daily  . enoxaparin (LOVENOX) injection  40 mg Subcutaneous Daily  . fluticasone  1 spray Each Nare Daily  . furosemide  40 mg Intravenous Daily  . gabapentin  600 mg Oral BID  . insulin aspart  0-15 Units Subcutaneous TID WC  . insulin aspart  0-5 Units Subcutaneous QHS  . insulin detemir  40 Units Subcutaneous Daily  . ipratropium-albuterol  3 mL Nebulization BID  . isosorbide-hydrALAZINE  1 tablet Oral TID  . latanoprost  1 drop Both Eyes QHS  .  losartan  50 mg Oral Daily  . metoprolol succinate  50 mg Oral Daily  . sodium chloride flush  3 mL Intravenous Q12H    Continuous Infusions: . sodium chloride       LOS: 4 days     Alma Friendly, MD [facility]  If 7PM-7AM, please contact night-coverage [email or web address] Password [facility] 04/02/2017, 2:38 PM

## 2017-04-03 LAB — BASIC METABOLIC PANEL
ANION GAP: 8 (ref 5–15)
BUN: 18 mg/dL (ref 6–20)
CO2: 30 mmol/L (ref 22–32)
Calcium: 8.5 mg/dL — ABNORMAL LOW (ref 8.9–10.3)
Chloride: 101 mmol/L (ref 101–111)
Creatinine, Ser: 1.44 mg/dL — ABNORMAL HIGH (ref 0.61–1.24)
GFR, EST AFRICAN AMERICAN: 56 mL/min — AB (ref >60)
GFR, EST NON AFRICAN AMERICAN: 48 mL/min — AB (ref >60)
Glucose, Bld: 89 mg/dL (ref 65–99)
POTASSIUM: 4 mmol/L (ref 3.5–5.1)
Sodium: 139 mmol/L (ref 135–145)

## 2017-04-03 LAB — GLUCOSE, CAPILLARY
GLUCOSE-CAPILLARY: 167 mg/dL — AB (ref 65–99)
GLUCOSE-CAPILLARY: 138 mg/dL — AB (ref 65–99)
GLUCOSE-CAPILLARY: 111 mg/dL — AB (ref 65–99)
Glucose-Capillary: 78 mg/dL (ref 65–99)

## 2017-04-03 LAB — CBC
HEMATOCRIT: 28.7 % — AB (ref 39.0–52.0)
HEMOGLOBIN: 9.1 g/dL — AB (ref 13.0–17.0)
MCH: 33.6 pg (ref 26.0–34.0)
MCHC: 31.7 g/dL (ref 30.0–36.0)
MCV: 105.9 fL — AB (ref 78.0–100.0)
Platelets: 227 10*3/uL (ref 150–400)
RBC: 2.71 MIL/uL — AB (ref 4.22–5.81)
RDW: 11.8 % (ref 11.5–15.5)
WBC: 7.1 10*3/uL (ref 4.0–10.5)

## 2017-04-03 MED ORDER — FUROSEMIDE 10 MG/ML IJ SOLN
20.0000 mg | Freq: Once | INTRAMUSCULAR | Status: AC
  Administered 2017-04-03: 20 mg via INTRAVENOUS
  Filled 2017-04-03: qty 2

## 2017-04-03 MED ORDER — LIVING BETTER WITH HEART FAILURE BOOK: Freq: Once | Status: DC

## 2017-04-03 NOTE — Progress Notes (Signed)
OT Cancellation Note  Patient Details Name: David [name] MRN: [id number] DOB: 1946/04/26   Cancelled Treatment:    Reason Eval/Treat Not Completed: Pt talking on the phone and wife requests OT return later.  Will reattempt as schedule allows  [facility], OTR/L [phone]   Lucille Passy M 04/03/2017, 1:42 PM

## 2017-04-03 NOTE — Progress Notes (Signed)
Physical Therapy Treatment Patient Details Name: David [name] MRN: [id number] DOB: [birth year] Today's Date: 04/03/2017    History of Present Illness Pt is a 71 y.o. male with PMH significant for HTN, DM, chronic back pain, CKD II, and chronic systolic CHF who presented to the ED on 03/29/17 with dyspnea; worked up for acute on chronic systolic CHF. Pt reports that he was in his usual state of health until he noted the insidious development of worsening dyspnea. Other PMH includes DDD, R THA,     PT Comments    Patient progressing well towards PT goals. Tolerated gait training with use of RW today and Min guard assist for safety.Pt relies heavily on UEs to offload pain in back. Sp02 remained >91% on RA during activity and no DOE noted. Pt anxious about going home prior to cardiac cath on Monday and does not seem to have good understanding of CHF diagnosis/prognosis. Encouraged ambulation over the weekend with RN. Will follow.   Follow Up Recommendations  Outpatient PT;Supervision for mobility/OOB     Equipment Recommendations  Rolling walker with 5" wheels    Recommendations for Other Services       Precautions / Restrictions Precautions Precautions: Fall Restrictions Weight Bearing Restrictions: No    Mobility  Bed Mobility               General bed mobility comments: Sitting at EOB on my arrival.   Transfers Overall transfer level: Needs assistance Equipment used: None Transfers: Sit to/from Stand Sit to Stand: Min guard         General transfer comment: Min guard for safety. Cues for upright posture.   Ambulation/Gait Ambulation/Gait assistance: Min guard Ambulation Distance (Feet): 250 Feet Assistive device: Rolling walker (2 wheeled) Gait Pattern/deviations: Step-through pattern;Decreased stride length;Trunk flexed Gait velocity: Decreased   General Gait Details: Very forward flexed trunk posture with heavy reliant on UEs on RW for support. Sp02  ranged from 91-96% on RA. HR stable. No standing rest breaks needed.    Stairs            Wheelchair Mobility    Modified Rankin (Stroke Patients Only)       Balance Overall balance assessment: Needs assistance Sitting-balance support: No upper extremity supported;Feet supported Sitting balance-Leahy Scale: Good Sitting balance - Comments: Able to dress self sitting EOB reaching otuside BoS without difficulty.    Standing balance support: Bilateral upper extremity supported;During functional activity Standing balance-Leahy Scale: Poor Standing balance comment: Reliant on UE support for dynamic balance                            Cognition Arousal/Alertness: Awake/alert Behavior During Therapy: WFL for tasks assessed/performed Overall Cognitive Status: Within Functional Limits for tasks assessed                                        Exercises      General Comments General comments (skin integrity, edema, etc.): Wife present during session. Pt worried about going home prior to cardiac cath on monday and wants 02 at home in case he needs it.      Pertinent Vitals/Pain Pain Assessment: Faces Faces Pain Scale: Hurts little more Pain Location: Back, R hip Pain Descriptors / Indicators: Aching;Sore;Constant Pain Intervention(s): Limited activity within patient's tolerance;Repositioned;Monitored during session;Patient requesting pain meds-RN notified    Home  Living                      Prior Function            PT Goals (current goals can now be found in the care plan section) Progress towards PT goals: Progressing toward goals    Frequency    Min 3X/week      PT Plan Current plan remains appropriate    Co-evaluation              AM-PAC PT "6 Clicks" Daily Activity  Outcome Measure  Difficulty turning over in bed (including adjusting bedclothes, sheets and blankets)?: None Difficulty moving from lying on back  to sitting on the side of the bed? : None Difficulty sitting down on and standing up from a chair with arms (e.g., wheelchair, bedside commode, etc,.)?: None Help needed moving to and from a bed to chair (including a wheelchair)?: A Little Help needed walking in hospital room?: A Little Help needed climbing 3-5 steps with a railing? : A Little 6 Click Score: 21    End of Session Equipment Utilized During Treatment: Gait belt Activity Tolerance: Patient tolerated treatment well Patient left: in bed;with call bell/phone within reach;with family/visitor present(sitting EOB.) Nurse Communication: Mobility status;Other (comment)(not needing 02) PT Visit Diagnosis: Unsteadiness on feet (R26.81);Muscle weakness (generalized) (M62.81);Difficulty in walking, not elsewhere classified (R26.2);Repeated falls (R29.6);History of falling (Z91.81)     Time: 8099-8338 PT Time Calculation (min) (ACUTE ONLY): 22 min  Charges:  $Therapeutic Exercise: 8-22 mins                    G Codes:       Wray Kearns, PT, DPT [phone]     Marguarite Arbour A Serina Nichter 04/03/2017, 3:50 PM

## 2017-04-03 NOTE — Progress Notes (Addendum)
Patient lying in bed, c/o SOB, raised HOB, lung sounds diminished. RT notified.  Patient feels better with nebulizer, and has questions about qualifying for nebs at home.

## 2017-04-03 NOTE — Care Management Note (Addendum)
Case Management Note  Patient Details  Name: [name] MRN: [id number] Date of Birth: [birth year]  Subjective/Objective:       Pt presented for respiratory failure.  Outpatient PT recommended for balance.   Pt from home with wife.  Pt can drive self to appointments.  Wife states she helps pt keep appointments and will drive if necessary.  No home O2 or other equipment.        Action/Plan: Outpt PT set up with [facility].  RN to evaluate for home O2 needs.  Also, pt states he has sleep apnea and would like to have sleep study ordered.    Expected Discharge Date:                  Expected Discharge Plan:  Home/Self Care  In-House Referral:  NA  Discharge planning Services  CM Consult  Post Acute Care Choice:  NA Choice offered to:  NA  DME Arranged:  N/A DME Agency:  NA  HH Arranged:  NA HH Agency:  NA  Status of Service:  Completed, signed off  If discussed at [place] of Stay Meetings, dates discussed:    Additional Comments:  Arley Phenix, RN 04/03/2017, 2:38 PM

## 2017-04-03 NOTE — Progress Notes (Signed)
SATURATION QUALIFICATIONS: (This note is used to comply with regulatory documentation for home oxygen)  Patient Saturations on Room Air at Rest = 97%  Patient Saturations on Room Air while Ambulating = 91%   Please briefly explain why patient needs home oxygen: Pt's Sp02 remained >91% on RA during all mobility.  Wray Kearns, [place], DPT [phone]

## 2017-04-03 NOTE — Progress Notes (Signed)
PROGRESS NOTE  David [name] YWV:[id number] DOB: Apr 27, 1946 DOA: 03/29/2017 PCP: Eloise Levels, NP (Inactive)  HPI/Recap of past 67 hours: 71 year old male with medical history significant for hypertension, diabetes, CKD stage II, chronic systolic CHF, chronic back pain, presented to the ED with dyspnea for about 3 days.  Upon arrival of EMS patient was found to be saturating 74% on room air and was treated with BiPAP. Feeling better, breathing better.  This morning he had an episode of PND.  No chest pain abdominal pain.  Was nauseated last night no vomiting.  Assessment/Plan: Principal Problem:   Acute hypoxemic respiratory failure (HCC) Active Problems:   Hypertension   Chronic back pain   Hypertensive urgency   Diabetes mellitus ([place])   CKD (chronic kidney disease), stage II   Insulin-requiring or dependent type II diabetes mellitus (HCC)   Pneumonia  #Acute hypoxemic respiratory failure Resolving Likely multifactorial, ?? CHF exacerbation, Hx of pulm HTN, rule out viral infection BNP 119, Echo with normal EF and normal diastolic function CT angiogram couldn't be completed due to malfunction CT.  VQ scan negative for PE Doppler lower extremities negative for DVT Oxygen as needed PT/OT Monitor closely  #Multifocal pneumonia Unlikely, no symptoms, not ill-appearing Currently afebrile, no leukocytosis Flu panel neg Initial chest x-ray showed possible multifocal pneumonia, repeat CXR negative Discontinued IV Rocephin and Zithromax  #History of chronic diastolic heart failure Previous echo showed EF 40-45 with global hypokinesis Echo done on this admission showed normal EF with no diastolic dysfunction Cardiology on board IV lasix 40mg  daily, losartan, metoprolol, bidil Weight still up more than his baseline.  Still symptomatic with orthopnea and PND although hypoxia has resolved.  #AK I on CKD stage III Improving Likely due to contrast exposure S/p 1L of IVF on  11/13 Daily BMP  #CAD Continue aspirin, Lipitor, bidil  #Hypertension Losartan, bidil, Lopressor changed to Toprol  #Type II DM Continue Levemir, SSI  Suspect sleep apnea. Patient has increased daytime sleepiness, fatigue, morning headaches, sudden awakening from his sleep with trouble breathing even when he does not have any volume overload.  Symptoms are consistent and concerning for sleep apnea recommend outpatient sleep study.   Code Status: Full  Family Communication: Wife at bedside  Disposition Plan: Pending improvement   Consultants:  Cardiology  Procedures:  None  Antimicrobials:  None  Status post IV ceftriaxone and azithromycin, discontinued  DVT prophylaxis: Lovenox   Objective: Vitals:   04/03/17 0525 04/03/17 0817 04/03/17 0924 04/03/17 0946  BP: (!) 156/88 132/69 (!) 149/69 (!) 149/69  Pulse:  70 69 66  Resp:  20  14  Temp: 98.2 F (36.8 C) 98.6 F (37 C)    TempSrc: Oral Oral    SpO2:  95%  98%  Weight:      Height:        Intake/Output Summary (Last 24 hours) at 04/03/2017 1705 Last data filed at 04/03/2017 1411 Gross per 24 hour  Intake 3 ml  Output 2400 ml  Net -2397 ml   Filed Weights   03/31/17 0500 04/01/17 0431 04/02/17 0359  Weight: 105.2 kg (231 lb 14.8 oz) 104.8 kg (231 lb 1.6 oz) 104.6 kg (230 lb 8 oz)    Exam:   General: Alert, awake, oriented x3  Cardiovascular: S1-S2 present, no added heart sounds  Respiratory: Chest clear bilaterally, no added sounds  Abdomen: Soft, nondistended, nontender  Musculoskeletal: Trace ankle edema on the right, none on the left  Skin: Normal  Psychiatry:  Normal mood   Data Reviewed: CBC: Recent Labs  Lab 03/29/17 0102  03/30/17 0301 03/31/17 0253 04/01/17 0251 04/02/17 0926 04/03/17 0603  WBC 17.2*   < > 5.8 5.9 5.8 6.9 7.1  NEUTROABS 14.6*  --   --   --   --   --   --   HGB 11.8*   < > 9.0* 9.5* 8.7* 9.7* 9.1*  HCT 36.2*   < > 28.4* 29.9* 27.3* 30.4* 28.7*    MCV 105.2*   < > 106.4* 107.2* 105.0* 105.9* 105.9*  PLT 274   < > 196 221 209 236 227   < > = values in this interval not displayed.   Basic Metabolic Panel: Recent Labs  Lab 03/30/17 0301 03/31/17 0253 04/01/17 0251 04/02/17 0926 04/03/17 0603  NA 138 139 138 139 139  K 3.9 4.2 3.8 3.9 4.0  CL 105 104 104 102 101  CO2 29 29 30 29 30   GLUCOSE 82 126* 142* 196* 89  BUN 24* 22* 19 15 18   CREATININE 1.59* 1.58* 1.44* 1.46* 1.44*  CALCIUM 7.9* 8.4* 8.1* 8.5* 8.5*   GFR: Estimated Creatinine Clearance: 62.5 mL/min (A) (by C-G formula based on SCr of 1.44 mg/dL (H)). Liver Function Tests: No results for input(s): AST, ALT, ALKPHOS, BILITOT, PROT, ALBUMIN in the last 168 hours. No results for input(s): LIPASE, AMYLASE in the last 168 hours. No results for input(s): AMMONIA in the last 168 hours. Coagulation Profile: No results for input(s): INR, PROTIME in the last 168 hours. Cardiac Enzymes: Recent Labs  Lab 04/01/17 1521  TROPONINI <0.03   BNP (last 3 results) No results for input(s): PROBNP in the last 8760 hours. HbA1C: Recent Labs    04/01/17 1850  HGBA1C 7.2*   CBG: Recent Labs  Lab 04/02/17 1214 04/02/17 1651 04/02/17 2128 04/03/17 0614 04/03/17 1157  GLUCAP 177* 197* 73 78 167*   Lipid Profile: No results for input(s): CHOL, HDL, LDLCALC, TRIG, CHOLHDL, LDLDIRECT in the last 72 hours. Thyroid Function Tests: No results for input(s): TSH, T4TOTAL, FREET4, T3FREE, THYROIDAB in the last 72 hours. Anemia Panel: No results for input(s): VITAMINB12, FOLATE, FERRITIN, TIBC, IRON, RETICCTPCT in the last 72 hours. Urine analysis:    Component Value Date/Time   COLORURINE STRAW (A) 09/04/2016 1233   APPEARANCEUR CLEAR 09/04/2016 1233   LABSPEC 1.006 09/04/2016 1233   PHURINE 5.0 09/04/2016 1233   GLUCOSEU NEGATIVE 09/04/2016 1233   HGBUR SMALL (A) 09/04/2016 1233   BILIRUBINUR NEGATIVE 09/04/2016 1233   KETONESUR NEGATIVE 09/04/2016 1233   PROTEINUR 100  (A) 09/04/2016 1233   UROBILINOGEN 1.0 02/13/2015 1934   NITRITE NEGATIVE 09/04/2016 1233   LEUKOCYTESUR NEGATIVE 09/04/2016 1233   Sepsis Labs: @LABRCNTIP (procalcitonin:4,lacticidven:4)  ) Recent Results (from the past 240 hour(s))  MRSA PCR Screening     Status: None   Collection Time: 03/29/17 10:37 PM  Result Value Ref Range Status   MRSA by PCR NEGATIVE NEGATIVE Final    Comment:        The GeneXpert MRSA Assay (FDA approved for NASAL specimens only), is one component of a comprehensive MRSA colonization surveillance program. It is not intended to diagnose MRSA infection nor to guide or monitor treatment for MRSA infections.       Studies: No results found.  Scheduled Meds: . aspirin EC  81 mg Oral Daily  . atorvastatin  40 mg Oral q1800  . docusate sodium  100 mg Oral Daily  . enoxaparin (LOVENOX) injection  40 mg Subcutaneous Daily  . fluticasone  1 spray Each Nare Daily  . furosemide  40 mg Intravenous Daily  . gabapentin  600 mg Oral BID  . insulin aspart  0-15 Units Subcutaneous TID WC  . insulin aspart  0-5 Units Subcutaneous QHS  . insulin detemir  40 Units Subcutaneous Daily  . ipratropium-albuterol  3 mL Nebulization BID  . isosorbide-hydrALAZINE  1 tablet Oral TID  . latanoprost  1 drop Both Eyes QHS  . Living Better with Heart Failure Book   Does not apply Once  . losartan  50 mg Oral Daily  . metoprolol succinate  50 mg Oral Daily  . sodium chloride flush  3 mL Intravenous Q12H    Continuous Infusions: . sodium chloride       LOS: 5 days     Berle Mull, MD [facility]  If 7PM-7AM, please contact night-coverage [email or web address] Password [email or web address] 04/03/2017, 5:05 PM

## 2017-04-04 LAB — GLUCOSE, CAPILLARY
GLUCOSE-CAPILLARY: 105 mg/dL — AB (ref 65–99)
GLUCOSE-CAPILLARY: 225 mg/dL — AB (ref 65–99)
GLUCOSE-CAPILLARY: 183 mg/dL — AB (ref 65–99)

## 2017-04-04 LAB — BASIC METABOLIC PANEL
Anion gap: 8 (ref 5–15)
BUN: 20 mg/dL (ref 6–20)
CHLORIDE: 99 mmol/L — AB (ref 101–111)
CO2: 29 mmol/L (ref 22–32)
CREATININE: 1.41 mg/dL — AB (ref 0.61–1.24)
Calcium: 8.5 mg/dL — ABNORMAL LOW (ref 8.9–10.3)
GFR calc Af Amer: 57 mL/min — ABNORMAL LOW (ref >60)
GFR calc non Af Amer: 49 mL/min — ABNORMAL LOW (ref >60)
Glucose, Bld: 229 mg/dL — ABNORMAL HIGH (ref 65–99)
Potassium: 3.9 mmol/L (ref 3.5–5.1)
Sodium: 136 mmol/L (ref 135–145)

## 2017-04-04 LAB — MAGNESIUM: Magnesium: 2.2 mg/dL (ref 1.7–2.4)

## 2017-04-04 MED ORDER — METOPROLOL SUCCINATE ER 50 MG PO TB24
100.0000 mg | ORAL_TABLET | Freq: Every day | ORAL | Status: DC
  Administered 2017-04-05 – 2017-04-09 (×5): 100 mg via ORAL
  Filled 2017-04-04: qty 2
  Filled 2017-04-04: qty 1
  Filled 2017-04-04 (×3): qty 2

## 2017-04-04 MED ORDER — FUROSEMIDE 40 MG PO TABS: 40.0000 mg | ORAL_TABLET | Freq: Two times a day (BID) | ORAL | 0 refills | Status: DC

## 2017-04-04 MED ORDER — FUROSEMIDE 10 MG/ML IJ SOLN
20.0000 mg | Freq: Once | INTRAMUSCULAR | Status: AC
  Administered 2017-04-04: 20 mg via INTRAVENOUS
  Filled 2017-04-04: qty 2

## 2017-04-04 MED ORDER — METOPROLOL SUCCINATE ER 50 MG PO TB24: 50.0000 mg | ORAL_TABLET | Freq: Every day | ORAL | 0 refills | Status: DC

## 2017-04-04 MED ORDER — AMLODIPINE BESYLATE 5 MG PO TABS
5.0000 mg | ORAL_TABLET | Freq: Every day | ORAL | Status: DC
  Administered 2017-04-04 – 2017-04-06 (×3): 5 mg via ORAL
  Filled 2017-04-04 (×2): qty 1

## 2017-04-04 MED ORDER — FUROSEMIDE 10 MG/ML IJ SOLN
40.0000 mg | Freq: Three times a day (TID) | INTRAMUSCULAR | Status: DC
  Administered 2017-04-04 – 2017-04-05 (×2): 40 mg via INTRAVENOUS
  Filled 2017-04-04 (×2): qty 4

## 2017-04-04 MED ORDER — METOPROLOL SUCCINATE ER 50 MG PO TB24
50.0000 mg | ORAL_TABLET | Freq: Once | ORAL | Status: AC
  Administered 2017-04-04: 50 mg via ORAL
  Filled 2017-04-04: qty 1

## 2017-04-04 MED ORDER — ISOSORB DINITRATE-HYDRALAZINE 20-37.5 MG PO TABS
2.0000 | ORAL_TABLET | Freq: Three times a day (TID) | ORAL | Status: DC
  Administered 2017-04-04 – 2017-04-09 (×13): 2 via ORAL
  Filled 2017-04-04 (×14): qty 2

## 2017-04-04 MED ORDER — FLUTICASONE PROPIONATE 50 MCG/ACT NA SUSP: 1.0000 | Freq: Every day | NASAL | 0 refills | Status: DC

## 2017-04-04 MED ORDER — ALBUTEROL SULFATE HFA 108 (90 BASE) MCG/ACT IN AERS: 2.0000 | INHALATION_SPRAY | Freq: Four times a day (QID) | RESPIRATORY_TRACT | 2 refills | Status: DC | PRN

## 2017-04-04 NOTE — Progress Notes (Addendum)
Rested well throughout night reclined in chair. Denied any dyspneic episodes throughout shift. Denies CP. VSS.

## 2017-04-04 NOTE — Discharge Instructions (Signed)
Heart Failure Action Plan A heart failure action plan helps you understand what to do when you have symptoms of heart failure. Follow the plan that was created by you and your health care provider. Review your plan each time you visit your health care provider. Red zone These signs and symptoms mean you should get medical help right away:  You have trouble breathing when resting.  You have a dry cough that is getting worse.  You have swelling or pain in your legs or abdomen that is getting worse.  You suddenly gain more than 2-3 lb (0.9-1.4 kg) in a day, or more than 5 lb (2.3 kg) in one week. This amount may be more or less depending on your condition.  You have trouble staying awake or you feel confused.  You have chest pain.  You do not have an appetite.  You pass out.  If you experience any of these symptoms:  Call your local emergency services (911 in the [place] right away or seek help at the emergency department of the nearest hospital.  Yellow zone These signs and symptoms mean your condition may be getting worse and you should make some changes:  You have trouble breathing when you are active or you need to sleep with extra pillows.  You have swelling in your legs or abdomen.  You gain 2-3 lb (0.9-1.4 kg) in one day, or 5 lb (2.3 kg) in one week. This amount may be more or less depending on your condition.  You get tired easily.  You have trouble sleeping.  You have a dry cough.  If you experience any of these symptoms:  Contact your health care provider within the next day.  Your health care provider may adjust your medicines.  Green zone These signs mean you are doing well and can continue what you are doing:  You do not have shortness of breath.  You have very little swelling or no new swelling.  Your weight is stable (no gain or loss).  You have a normal activity level.  You do not have chest pain or any other new symptoms.  Follow these  instructions at home:  Take over-the-counter and prescription medicines only as told by your health care provider.  Weigh yourself daily.  ? Call your health care provider if you gain more than _____3_____ lb in a day, or more than ____5______ lb in one week.  Eat a heart-healthy diet. Work with a diet and nutrition specialist (dietitian) to create an eating plan that is best for you.  Keep all follow-up visits as told by your health care provider. This is important. Where to find more information:  American Heart Association: www.heart.org Summary  Follow the action plan that was created by you and your health care provider.  Get help right away if you have any symptoms in the Red zone. This information is not intended to replace advice given to you by your health care provider. Make sure you discuss any questions you have with your health care provider. Document Released: 06/14/2016 Document Revised: 06/14/2016 Document Reviewed: 06/14/2016 Elsevier Interactive Patient Education  2018 [place].  Heart Failure Heart failure means your heart has trouble pumping blood. This makes it hard for your body to work well. Heart failure is usually a long-term (chronic) condition. You must take good care of yourself and follow your doctor's treatment plan. Follow these instructions at home:  Take your heart medicine as told by your doctor. ? Do not stop  taking medicine unless your doctor tells you to. ? Do not skip any dose of medicine. ? Refill your medicines before they run out. ? Take other medicines only as told by your doctor or pharmacist.  Stay active if told by your doctor. The elderly and people with severe heart failure should talk with a doctor about physical activity.  Eat heart-healthy foods. Choose foods that are without trans fat and are low in saturated fat, cholesterol, and salt (sodium). This includes fresh or frozen fruits and vegetables, fish, lean meats, fat-free or  low-fat dairy foods, whole grains, and high-fiber foods. Lentils and dried peas and beans (legumes) are also good choices.  Limit salt if told by your doctor.  Cook in a healthy way. Roast, grill, broil, bake, poach, steam, or stir-fry foods.  Limit fluids as told by your doctor.  Weigh yourself every morning. Do this after you pee (urinate) and before you eat breakfast. Write down your weight to give to your doctor.  Take your blood pressure and write it down if your doctor tells you to.  Ask your doctor how to check your pulse. Check your pulse as told.  Lose weight if told by your doctor.  Stop smoking or chewing tobacco. Do not use gum or patches that help you quit without your doctor's approval.  Schedule and go to doctor visits as told.  Nonpregnant women should have no more than 1 drink a day. Men should have no more than 2 drinks a day. Talk to your doctor about drinking alcohol.  Stop illegal drug use.  Stay current with shots (immunizations).  Manage your health conditions as told by your doctor.  Learn to manage your stress.  Rest when you are tired.  If it is really hot outside: ? Avoid intense activities. ? Use air conditioning or fans, or get in a cooler place. ? Avoid caffeine and alcohol. ? Wear loose-fitting, lightweight, and light-colored clothing.  If it is really cold outside: ? Avoid intense activities. ? Layer your clothing. ? Wear mittens or gloves, a hat, and a scarf when going outside. ? Avoid alcohol.  Learn about heart failure and get support as needed.  Get help to maintain or improve your quality of life and your ability to care for yourself as needed. Contact a doctor if:  You gain weight quickly.  You are more short of breath than usual.  You cannot do your normal activities.  You tire easily.  You cough more than normal, especially with activity.  You have any or more puffiness (swelling) in areas such as your hands, feet,  ankles, or belly (abdomen).  You cannot sleep because it is hard to breathe.  You feel like your heart is beating fast (palpitations).  You get dizzy or light-headed when you stand up. Get help right away if:  You have trouble breathing.  There is a change in mental status, such as becoming less alert or not being able to focus.  You have chest pain or discomfort.  You faint. This information is not intended to replace advice given to you by your health care provider. Make sure you discuss any questions you have with your health care provider. Document Released: 02/12/2008 Document Revised: 10/11/2015 Document Reviewed: 06/21/2012 Elsevier Interactive Patient Education  2017 [employer].

## 2017-04-04 NOTE — Progress Notes (Signed)
Subjective:  Has started to feel better. Still dyspnea present. Leg edema persists.   Objective:  Vital Signs in the last 24 hours: Temp:  [98.2 F (36.8 C)-98.7 F (37.1 C)] 98.2 F (36.8 C) (11/17 0832) Pulse Rate:  [67] 67 (11/17 0832) Resp:  [10-22] 18 (11/17 0832) BP: (140-168)/(68-79) 168/70 (11/17 0832) SpO2:  [96 %-99 %] 98 % (11/17 1028) Weight:  [103.9 kg (229 lb 1.6 oz)] 103.9 kg (229 lb 1.6 oz) (11/17 0600)  Intake/Output from previous day: 11/16 0701 - 11/17 0700 In: 423 [P.O.:420; I.V.:3] Out: 2300 [Urine:2300]  Physical Exam: Body mass index is 29.41 kg/m.  General appearance: alert, cooperative, appears stated age and no distress Eyes: negative findings: lids and lashes normal Neck: no adenopathy, no carotid bruit, supple, symmetrical, trachea midline and thyroid not enlarged, symmetric, no tenderness/mass/nodules Neck: JVP - normal, carotids 2+= without bruits Resp: faint crackles at the bases Chest wall: no tenderness, right sided chest wall tenderness Cardio: S4 present GI: soft, non-tender; bowel sounds normal; no masses,  no organomegaly Extremities: edema 2 plus. Vascular exam normal    Lab Results: BMP Recent Labs    04/02/17 0926 04/03/17 0603 04/04/17 1116  NA 139 139 136  K 3.9 4.0 3.9  CL 102 101 99*  CO2 29 30 29   GLUCOSE 196* 89 229*  BUN 15 18 20   CREATININE 1.46* 1.44* 1.41*  CALCIUM 8.5* 8.5* 8.5*  GFRNONAA 47* 48* 49*  GFRAA 55* 56* 57*    CBC Recent Labs  Lab 03/29/17 0102  04/03/17 0603  WBC 17.2*   < > 7.1  RBC 3.44*   < > 2.71*  HGB 11.8*   < > 9.1*  HCT 36.2*   < > 28.7*  PLT 274   < > 227  MCV 105.2*   < > 105.9*  MCH 34.3*   < > 33.6  MCHC 32.6   < > 31.7  RDW 12.0   < > 11.8  LYMPHSABS 1.2  --   --   MONOABS 1.3*  --   --   EOSABS 0.0  --   --   BASOSABS 0.0  --   --    < > = values in this interval not displayed.    HEMOGLOBIN A1C Lab Results  Component Value Date   HGBA1C 7.2 (H) 04/01/2017    MPG 159.94 04/01/2017    Cardiac Panel (last 3 results) Recent Labs    09/04/16 0700 09/04/16 1236 04/01/17 1521  TROPONINI 0.06* 0.05* <0.03    TSH Recent Labs    09/04/16 0119  TSH 1.411   Hepatic Function Panel Recent Labs    09/03/16 2255  PROT 7.9  ALBUMIN 3.4*  AST 23  ALT 42  ALKPHOS 191*  BILITOT 1.0  BILIDIR <0.1*  IBILI NOT CALCULATED    Imaging: Imaging results have been reviewed  Cardiac Studies:  Lexiscan myoview stress test 10/20/2016:  1. Resting EKG demonstrated normal sinus rhythm, normal axis, poor R-wave progression. Stress EKG is nondiagnostic for ischemia as it is a pharmacologic stress test. Stress symptoms included dyspnea and chest pressure. 2. The LV is dilated both at rest and stress images. The LV end diastolic volume was 742 mL. SPECT images demonstrate small perfusion abnormality of mild intensity in the basal inferior and mid inferior myocardial wall(s) on the stress images. The defect is partially reversible on the resting images consistent with a mixture of myocardial infarction and ischemia. The left ventricular ejection fraction was  calculated or visually estimated to be 48% with global hypokinesis. This is an intermediate risk study, clinical correlation recommended.  EKG 04/01/2017: Normal sinus rhythm LVH, LAE. First degree AV block.   Echocardiogram 03/30/2017: Study Conclusions  - Left ventricle: The cavity size was mildly dilated. There was moderate concentric hypertrophy. Systolic function was normal.The estimated ejection fraction was in the range of 55% to 60%. Wall motion was normal; there were no regional wall motion abnormalities. Left ventricular diastolic function parameterswere normal. - Aortic valve: Trileaflet; normal thickness, mildly calcified leaflets. Valve area (VTI): 2.55 cm^2. Valve area (Vmax): 2.78 cm^2. Valve area (Vmean): 2.51 cm^2. - Mitral valve: Mildly calcified chordae  tendinae. - Tricuspid valve: There was trivial regurgitation. - Pulmonic valve: There was trivial regurgitation. - Pulmonary arteries: Systolic pressure could not be accuratelyestimated.  Assessment/Plan:  1. Acute on chronic diastolic heart failure 2. Chronic stage 3 CKD due to DM 3. DM 2 uncontrolled with hyperglycemia 4. Abnormal nuclear stress 5. HTN uncontrolled  Rec: Diurese him with lasix today. I suspect renal function is at baseline. Increase Metoprolol and add Amlodipine to control BP better. Plan cath [facility] unless renal function deteriorates significantly with diuresis. Wife present and all questions answered. Discussed diabetes and non compliance with diet.   David [name], M.D. 04/04/2017, 1:49 PM [facility], [place] Pager: [phone] Office: [phone] If no answer: [phone]

## 2017-04-04 NOTE — Progress Notes (Signed)
PROGRESS NOTE  David [name] TGG:[id number] DOB: 02/12/1946 DOA: 03/29/2017 PCP: Eloise Levels, NP (Inactive)  HPI/Recap of past 72 hours: 71 year old male with medical history significant for hypertension, diabetes, CKD stage II, chronic systolic CHF, chronic back pain, presented to the ED with dyspnea for about 3 days.  Upon arrival of EMS patient was found to be saturating 74% on room air and was treated with BiPAP. Continues to have complaints about having PND and was actually sleeping last night in a recliner.  No chest pain no abdominal pain.  Assessment/Plan: Principal Problem:   Acute hypoxemic respiratory failure (HCC) Active Problems:   Hypertension   Chronic back pain   Hypertensive urgency   Diabetes mellitus ([place])   CKD (chronic kidney disease), stage II   Insulin-requiring or dependent type II diabetes mellitus ([place])   Pneumonia  #Acute hypoxemic respiratory failure Resolving From CHF acute on chronic BNP 119, Echo with normal EF and normal diastolic function CT angiogram couldn't be completed due to malfunction CT.  VQ scan negative for PE Doppler lower extremities negative for DVT Oxygen as needed PT/OT Monitor closely  #Multifocal pneumonia, ruled out Unlikely, no symptoms, not ill-appearing Currently afebrile, no leukocytosis Flu panel neg Initial chest x-ray showed possible multifocal pneumonia, repeat CXR negative Discontinued IV Rocephin and Zithromax  #Acute on chronic combined diastolic CHF  Previous echo showed EF 40-45 with global hypokinesis Echo done on this admission showed normal EF with no diastolic dysfunction Cardiology on board IV lasix 40mg  daily, losartan, metoprolol, bidil dose increase metoprolol and amlodipine per cardiology as well.  Weight still up more than his baseline.  Still symptomatic with orthopnea and PND although hypoxia has resolved.   Give extra 20 mg IV Lasix and monitor.  #AK I on CKD stage III Resolved and  renal function at baseline Likely due to contrast exposure S/p 1L of IVF on 11/13 Daily BMP  #CAD Continue aspirin, Lipitor, bidil Cardiology is considering left and right heart cath on Monday.  Will monitor over weekend.  #Hypertension Losartan, bidil, Lopressor changed to Toprol  #Type II DM Continue Levemir, SSI  Suspect sleep apnea. Patient has increased daytime sleepiness, fatigue, morning headaches, sudden awakening from his sleep with trouble breathing even when he does not have any volume overload.  Symptoms are consistent and concerning for sleep apnea recommend outpatient sleep study.   Code Status: Full  Family Communication: Wife at bedside  Disposition Plan: Pending improvement   Consultants:  Cardiology  Procedures:  None  Antimicrobials:  None  Status post IV ceftriaxone and azithromycin, discontinued  DVT prophylaxis: Lovenox   Objective: Vitals:   04/04/17 0700 04/04/17 0832 04/04/17 1028 04/04/17 1430  BP:  (!) 168/70  (!) 145/67  Pulse:  67  62  Resp: 12 18  18   Temp:  98.2 F (36.8 C)  98.7 F (37.1 C)  TempSrc:  Oral  Oral  SpO2:  99% 98% 99%  Weight:      Height:        Intake/Output Summary (Last 24 hours) at 04/04/2017 1543 Last data filed at 04/04/2017 1300 Gross per 24 hour  Intake 900 ml  Output 800 ml  Net 100 ml   Filed Weights   04/01/17 0431 04/02/17 0359 04/04/17 0600  Weight: 104.8 kg (231 lb 1.6 oz) 104.6 kg (230 lb 8 oz) 103.9 kg (229 lb 1.6 oz)    Exam:   General: Alert, awake, oriented x3  Cardiovascular: S1-S2 present, no added heart  sounds  Respiratory: Chest clear bilaterally, no added sounds  Abdomen: Soft, nondistended, nontender  Musculoskeletal: Trace ankle edema on the right, none on the left  Skin: Normal  Psychiatry: Normal mood   Data Reviewed: CBC: Recent Labs  Lab 03/29/17 0102  03/30/17 0301 03/31/17 0253 04/01/17 0251 04/02/17 0926 04/03/17 0603  WBC 17.2*   < > 5.8  5.9 5.8 6.9 7.1  NEUTROABS 14.6*  --   --   --   --   --   --   HGB 11.8*   < > 9.0* 9.5* 8.7* 9.7* 9.1*  HCT 36.2*   < > 28.4* 29.9* 27.3* 30.4* 28.7*  MCV 105.2*   < > 106.4* 107.2* 105.0* 105.9* 105.9*  PLT 274   < > 196 221 209 236 227   < > = values in this interval not displayed.   Basic Metabolic Panel: Recent Labs  Lab 03/31/17 0253 04/01/17 0251 04/02/17 0926 04/03/17 0603 04/04/17 1116  NA 139 138 139 139 136  K 4.2 3.8 3.9 4.0 3.9  CL 104 104 102 101 99*  CO2 29 30 29 30 29   GLUCOSE 126* 142* 196* 89 229*  BUN 22* 19 15 18 20   CREATININE 1.58* 1.44* 1.46* 1.44* 1.41*  CALCIUM 8.4* 8.1* 8.5* 8.5* 8.5*  MG  --   --   --   --  2.2   GFR: Estimated Creatinine Clearance: 63.6 mL/min (A) (by C-G formula based on SCr of 1.41 mg/dL (H)). Liver Function Tests: No results for input(s): AST, ALT, ALKPHOS, BILITOT, PROT, ALBUMIN in the last 168 hours. No results for input(s): LIPASE, AMYLASE in the last 168 hours. No results for input(s): AMMONIA in the last 168 hours. Coagulation Profile: No results for input(s): INR, PROTIME in the last 168 hours. Cardiac Enzymes: Recent Labs  Lab 04/01/17 1521  TROPONINI <0.03   BNP (last 3 results) No results for input(s): PROBNP in the last 8760 hours. HbA1C: Recent Labs    04/01/17 1850  HGBA1C 7.2*   CBG: Recent Labs  Lab 04/03/17 1157 04/03/17 1735 04/03/17 2201 04/04/17 0623 04/04/17 1121  GLUCAP 167* 138* 111* 105* 225*   Lipid Profile: No results for input(s): CHOL, HDL, LDLCALC, TRIG, CHOLHDL, LDLDIRECT in the last 72 hours. Thyroid Function Tests: No results for input(s): TSH, T4TOTAL, FREET4, T3FREE, THYROIDAB in the last 72 hours. Anemia Panel: No results for input(s): VITAMINB12, FOLATE, FERRITIN, TIBC, IRON, RETICCTPCT in the last 72 hours. Urine analysis:    Component Value Date/Time   COLORURINE STRAW (A) 09/04/2016 1233   APPEARANCEUR CLEAR 09/04/2016 1233   LABSPEC 1.006 09/04/2016 1233    PHURINE 5.0 09/04/2016 1233   GLUCOSEU NEGATIVE 09/04/2016 1233   HGBUR SMALL (A) 09/04/2016 1233   BILIRUBINUR NEGATIVE 09/04/2016 1233   KETONESUR NEGATIVE 09/04/2016 1233   PROTEINUR 100 (A) 09/04/2016 1233   UROBILINOGEN 1.0 02/13/2015 1934   NITRITE NEGATIVE 09/04/2016 1233   LEUKOCYTESUR NEGATIVE 09/04/2016 1233   Sepsis Labs: @LABRCNTIP (procalcitonin:4,lacticidven:4)  ) Recent Results (from the past 240 hour(s))  MRSA PCR Screening     Status: None   Collection Time: 03/29/17 10:37 PM  Result Value Ref Range Status   MRSA by PCR NEGATIVE NEGATIVE Final    Comment:        The GeneXpert MRSA Assay (FDA approved for NASAL specimens only), is one component of a comprehensive MRSA colonization surveillance program. It is not intended to diagnose MRSA infection nor to guide or monitor treatment for MRSA  infections.       Studies: No results found.  Scheduled Meds: . amLODipine  5 mg Oral Daily  . aspirin EC  81 mg Oral Daily  . atorvastatin  40 mg Oral q1800  . docusate sodium  100 mg Oral Daily  . enoxaparin (LOVENOX) injection  40 mg Subcutaneous Daily  . fluticasone  1 spray Each Nare Daily  . furosemide  20 mg Intravenous Once  . furosemide  40 mg Intravenous Q8H  . gabapentin  600 mg Oral BID  . insulin aspart  0-15 Units Subcutaneous TID WC  . insulin aspart  0-5 Units Subcutaneous QHS  . insulin detemir  40 Units Subcutaneous Daily  . ipratropium-albuterol  3 mL Nebulization BID  . isosorbide-hydrALAZINE  2 tablet Oral TID  . latanoprost  1 drop Both Eyes QHS  . Living Better with Heart Failure Book   Does not apply Once  . losartan  50 mg Oral Daily  . [START ON 04/05/2017] metoprolol succinate  100 mg Oral Daily  . metoprolol succinate  50 mg Oral Once  . sodium chloride flush  3 mL Intravenous Q12H    Continuous Infusions: . sodium chloride       LOS: 6 days     Berle Mull, MD [facility]  If 7PM-7AM, please contact  night-coverage [email or web address] Password [facility] 04/04/2017, 3:43 PM

## 2017-04-05 LAB — BASIC METABOLIC PANEL
ANION GAP: 6 (ref 5–15)
BUN: 25 mg/dL — ABNORMAL HIGH (ref 6–20)
CALCIUM: 8.5 mg/dL — AB (ref 8.9–10.3)
CHLORIDE: 99 mmol/L — AB (ref 101–111)
CO2: 32 mmol/L (ref 22–32)
Creatinine, Ser: 1.64 mg/dL — ABNORMAL HIGH (ref 0.61–1.24)
GFR calc non Af Amer: 41 mL/min — ABNORMAL LOW (ref >60)
GFR, EST AFRICAN AMERICAN: 48 mL/min — AB (ref >60)
GLUCOSE: 96 mg/dL (ref 65–99)
POTASSIUM: 4.1 mmol/L (ref 3.5–5.1)
Sodium: 137 mmol/L (ref 135–145)

## 2017-04-05 LAB — GLUCOSE, CAPILLARY
GLUCOSE-CAPILLARY: 89 mg/dL (ref 65–99)
GLUCOSE-CAPILLARY: 179 mg/dL — AB (ref 65–99)
GLUCOSE-CAPILLARY: 150 mg/dL — AB (ref 65–99)
GLUCOSE-CAPILLARY: 179 mg/dL — AB (ref 65–99)
Glucose-Capillary: 114 mg/dL — ABNORMAL HIGH (ref 65–99)

## 2017-04-05 MED ORDER — SODIUM CHLORIDE 0.9% FLUSH: 3.0000 mL | Freq: Two times a day (BID) | INTRAVENOUS | Status: DC

## 2017-04-05 MED ORDER — SODIUM CHLORIDE 0.9% FLUSH: 3.0000 mL | INTRAVENOUS | Status: DC | PRN

## 2017-04-05 MED ORDER — SODIUM CHLORIDE 0.9 % IV SOLN: 250.0000 mL | INTRAVENOUS | Status: DC | PRN

## 2017-04-05 MED ORDER — SODIUM CHLORIDE 0.9 % WEIGHT BASED INFUSION
1.0000 mL/kg/h | INTRAVENOUS | Status: DC
  Administered 2017-04-05 – 2017-04-06 (×2): 1 mL/kg/h via INTRAVENOUS

## 2017-04-05 MED ORDER — FUROSEMIDE 10 MG/ML IJ SOLN: 20.0000 mg | Freq: Once | INTRAMUSCULAR | Status: DC

## 2017-04-05 NOTE — Progress Notes (Addendum)
Subjective:  Has started to feel better. Still dyspnea present. Leg edema persists.   Objective:  Vital Signs in the last 24 hours: Temp:  [97.9 F (36.6 C)-98.4 F (36.9 C)] 98.4 F (36.9 C) (11/18 1341) Pulse Rate:  [66-71] 67 (11/18 1341) Resp:  [18-20] 20 (11/18 1341) BP: (136-158)/(70-86) 155/74 (11/18 1341) SpO2:  [96 %-100 %] 100 % (11/18 1341) Weight:  [102.8 kg (226 lb 11.2 oz)] 102.8 kg (226 lb 11.2 oz) (11/18 0550)  Intake/Output from previous day: 11/17 0701 - 11/18 0700 In: 480 [P.O.:480] Out: 1100 [Urine:1100]  Physical Exam: Body mass index is 29.11 kg/m.  General appearance: alert, cooperative, appears stated age and no distress Eyes: negative findings: lids and lashes normal Neck: no adenopathy, no carotid bruit, supple, symmetrical, trachea midline and thyroid not enlarged, symmetric, no tenderness/mass/nodules Neck: JVP - normal, carotids 2+= without bruits Resp: faint crackles at the bases Chest wall: no tenderness, right sided chest wall tenderness Cardio: S4 present GI: soft, non-tender; bowel sounds normal; no masses,  no organomegaly Extremities: edema Trace. Vascular exam normal    Lab Results: BMP Recent Labs    04/03/17 0603 04/04/17 1116 04/05/17 0525  NA 139 136 137  K 4.0 3.9 4.1  CL 101 99* 99*  CO2 30 29 32  GLUCOSE 89 229* 96  BUN 18 20 25*  CREATININE 1.44* 1.41* 1.64*  CALCIUM 8.5* 8.5* 8.5*  GFRNONAA 48* 49* 41*  GFRAA 56* 57* 48*    CBC Latest Ref Rng & Units 04/03/2017 04/02/2017 04/01/2017  WBC 4.0 - 10.5 K/uL 7.1 6.9 5.8  Hemoglobin 13.0 - 17.0 g/dL 9.1(L) 9.7(L) 8.7(L)  Hematocrit 39.0 - 52.0 % 28.7(L) 30.4(L) 27.3(L)  Platelets 150 - 400 K/uL 227 236 209    HEMOGLOBIN A1C Lab Results  Component Value Date   HGBA1C 7.2 (H) 04/01/2017   MPG 159.94 04/01/2017    Cardiac Panel (last 3 results) Recent Labs    09/04/16 0700 09/04/16 1236 04/01/17 1521  TROPONINI 0.06* 0.05* <0.03    TSH Recent Labs     09/04/16 0119  TSH 1.411   Hepatic Function Panel Recent Labs    09/03/16 2255  PROT 7.9  ALBUMIN 3.4*  AST 23  ALT 42  ALKPHOS 191*  BILITOT 1.0  BILIDIR <0.1*  IBILI NOT CALCULATED   Scheduled Meds: . amLODipine  5 mg Oral Daily  . aspirin EC  81 mg Oral Daily  . atorvastatin  40 mg Oral q1800  . docusate sodium  100 mg Oral Daily  . enoxaparin (LOVENOX) injection  40 mg Subcutaneous Daily  . fluticasone  1 spray Each Nare Daily  . gabapentin  600 mg Oral BID  . insulin aspart  0-15 Units Subcutaneous TID WC  . insulin aspart  0-5 Units Subcutaneous QHS  . insulin detemir  40 Units Subcutaneous Daily  . ipratropium-albuterol  3 mL Nebulization BID  . isosorbide-hydrALAZINE  2 tablet Oral TID  . latanoprost  1 drop Both Eyes QHS  . Living Better with Heart Failure Book   Does not apply Once  . metoprolol succinate  100 mg Oral Daily  . sodium chloride flush  3 mL Intravenous Q12H   Continuous Infusions: . sodium chloride     PRN Meds:.sodium chloride, acetaminophen, albuterol, ALPRAZolam, hydrALAZINE, HYDROcodone-acetaminophen, methocarbamol, ondansetron (ZOFRAN) IV, sodium chloride flush  Imaging: Imaging results have been reviewed  Cardiac Studies:  Lexiscan myoview stress test 10/20/2016:  1. Resting EKG demonstrated normal sinus rhythm, normal axis,  poor R-wave progression. Stress EKG is nondiagnostic for ischemia as it is a pharmacologic stress test. Stress symptoms included dyspnea and chest pressure. 2. The LV is dilated both at rest and stress images. The LV end diastolic volume was 023 mL. SPECT images demonstrate small perfusion abnormality of mild intensity in the basal inferior and mid inferior myocardial wall(s) on the stress images. The defect is partially reversible on the resting images consistent with a mixture of myocardial infarction and ischemia. The left ventricular ejection fraction was calculated or visually estimated to be 48% with global  hypokinesis. This is an intermediate risk study, clinical correlation recommended.  EKG 04/01/2017: Normal sinus rhythm LVH, LAE. First degree AV block.   Echocardiogram 03/30/2017: - Left ventricle: The cavity size was mildly dilated. There wasmoderate concentric hypertrophy. Systolic function was normal.The estimated ejection fraction was in the range of 55% to 60%. Wall motion was normal; there were no regional wall motion abnormalities. Left ventricular diastolic function parameterswere normal. - Aortic valve: Trileaflet; normal thickness, mildly calcified leaflets. Valve area (VTI): 2.55 cm^2. Valve area (Vmax): 2.78 cm^2. Valve area (Vmean): 2.51 cm^2. - Mitral valve: Mildly calcified chordae tendinae. - Tricuspid valve: There was trivial regurgitation. - Pulmonic valve: There was trivial regurgitation. - Pulmonary arteries: Systolic pressure could not be accuratelyestimated.  Assessment/Plan:  1. Acute on chronic diastolic heart failure 2. Shortness of breath 3. Chronic stage 3 CKD due to DM 4. Abnormal nuclear stress 5. HTN uncontrolled 6. DM 2 uncontrolled with hyperglycemia with stage 3 CKD.  Rec: Renal function is at baseline. Tolerating  Metoprolol and add Amlodipine to control BP better. Hold losartan today in preparation for angiogram tomorrow. No further lasix today. Wife present and all questions answered. Discussed diabetes and non compliance with diet again. Edema has resolved with extra dose of IV lasix yesterday. BP still high, will leave it high for now until after heart cath to avoid hypotension in view of possible contrast nephropathy.   He has severe back pain and has been scheduled for laminectomy next month, hence in view of upcoming surgery, unless he has major vessel disease or proximal vessel disease that could be life-threatening, we will not perform angioplasty.  Adrian Prows, M.D. 04/05/2017, 2:45 PM [facility], [place] Pager:  [phone] Office: [phone] If no answer: [phone]

## 2017-04-05 NOTE — Progress Notes (Signed)
PROGRESS NOTE  ECHO ALLSBROOK HQI:[id number] DOB: 1945/05/28 DOA: 03/29/2017 PCP: Eloise Levels, NP (Inactive)  HPI/Recap of past 15 hours: 71 year old male with medical history significant for hypertension, diabetes, CKD stage II, chronic systolic CHF, chronic back pain, presented to the ED with dyspnea for about 3 days.  Upon arrival of EMS patient was found to be saturating 74% on room air and was treated with BiPAP. Breathing is better, swelling is still present.  No chest pain no abdominal pain.  Slept in the recliner again.  Planning to use a wedge at home.  Assessment/Plan: Principal Problem:   Acute hypoxemic respiratory failure (HCC) Active Problems:   Hypertension   Chronic back pain   Hypertensive urgency   Diabetes mellitus ([place])   CKD (chronic kidney disease), stage II   Insulin-requiring or dependent type II diabetes mellitus ([place])   Pneumonia  #Acute hypoxemic respiratory failure Resolving From CHF acute on chronic BNP 119, Echo with normal EF and normal diastolic function CT angiogram couldn't be completed due to malfunction CT.  VQ scan negative for PE Doppler lower extremities negative for DVT Oxygen as needed PT/OT Monitor closely  #Multifocal pneumonia, ruled out Unlikely, no symptoms, not ill-appearing Currently afebrile, no leukocytosis Flu panel neg Initial chest x-ray showed possible multifocal pneumonia, repeat CXR negative Discontinued IV Rocephin and Zithromax  #Acute on chronic combined diastolic CHF  Previous echo showed EF 40-45 with global hypokinesis Echo done on this admission showed normal EF with no diastolic dysfunction Cardiology on board IV lasix 40mg  daily, losartan, metoprolol, bidil dose increase metoprolol and amlodipine per cardiology as well.  Weight still up more than his baseline.  Still symptomatic with orthopnea and PND although hypoxia has resolved.   Give extra 20 mg IV Lasix and monitor.  #AK I on CKD stage  III Resolved and renal function at baseline Likely due to contrast exposure S/p 1L of IVF on 11/13 Daily BMP Mild increase in renal function as compared to yesterday most likely due to overdiuresis.  Currently already received 40 mg of IV Lasix.  Will hold off on further diuresis.  #CAD Continue aspirin, Lipitor, bidil Cardiology is considering left and right heart cath on Monday.  Will monitor over weekend.  #Hypertension Losartan, bidil, Lopressor changed to Toprol  #Type II DM Continue Levemir, SSI  Suspect sleep apnea. Patient has increased daytime sleepiness, fatigue, morning headaches, sudden awakening from his sleep with trouble breathing even when he does not have any volume overload.  Symptoms are consistent and concerning for sleep apnea recommend outpatient sleep study.   Code Status: Full  Family Communication: Wife at bedside  Disposition Plan: Pending improvement   Consultants:  Cardiology  Procedures:  None  Antimicrobials:  None  Status post IV ceftriaxone and azithromycin, discontinued  DVT prophylaxis: Lovenox   Objective: Vitals:   04/04/17 2003 04/05/17 0550 04/05/17 0957 04/05/17 1341  BP: (!) 158/86 136/70  (!) 155/74  Pulse: 66 71  67  Resp: 18 18  20   Temp: 98 F (36.7 C) 97.9 F (36.6 C)  98.4 F (36.9 C)  TempSrc: Oral Oral  Oral  SpO2: 100% 100% 96% 100%  Weight:  102.8 kg (226 lb 11.2 oz)    Height:        Intake/Output Summary (Last 24 hours) at 04/05/2017 1448 Last data filed at 04/05/2017 1227 Gross per 24 hour  Intake 480 ml  Output 1400 ml  Net -920 ml   Filed Weights   04/02/17  2595 04/04/17 0600 04/05/17 0550  Weight: 104.6 kg (230 lb 8 oz) 103.9 kg (229 lb 1.6 oz) 102.8 kg (226 lb 11.2 oz)    Exam:   General: Alert, awake, oriented x3  Cardiovascular: S1-S2 present, no added heart sounds  Respiratory: Chest clear bilaterally, no added sounds  Abdomen: Soft, nondistended,  nontender  Musculoskeletal: Trace ankle edema on the right, none on the left  Skin: Normal  Psychiatry: Normal mood   Data Reviewed: CBC: Recent Labs  Lab 03/30/17 0301 03/31/17 0253 04/01/17 0251 04/02/17 0926 04/03/17 0603  WBC 5.8 5.9 5.8 6.9 7.1  HGB 9.0* 9.5* 8.7* 9.7* 9.1*  HCT 28.4* 29.9* 27.3* 30.4* 28.7*  MCV 106.4* 107.2* 105.0* 105.9* 105.9*  PLT 196 221 209 236 638   Basic Metabolic Panel: Recent Labs  Lab 04/01/17 0251 04/02/17 0926 04/03/17 0603 04/04/17 1116 04/05/17 0525  NA 138 139 139 136 137  K 3.8 3.9 4.0 3.9 4.1  CL 104 102 101 99* 99*  CO2 30 29 30 29  32  GLUCOSE 142* 196* 89 229* 96  BUN 19 15 18 20  25*  CREATININE 1.44* 1.46* 1.44* 1.41* 1.64*  CALCIUM 8.1* 8.5* 8.5* 8.5* 8.5*  MG  --   --   --  2.2  --    GFR: Estimated Creatinine Clearance: 54.4 mL/min (A) (by C-G formula based on SCr of 1.64 mg/dL (H)). Liver Function Tests: No results for input(s): AST, ALT, ALKPHOS, BILITOT, PROT, ALBUMIN in the last 168 hours. No results for input(s): LIPASE, AMYLASE in the last 168 hours. No results for input(s): AMMONIA in the last 168 hours. Coagulation Profile: No results for input(s): INR, PROTIME in the last 168 hours. Cardiac Enzymes: Recent Labs  Lab 04/01/17 1521  TROPONINI <0.03   BNP (last 3 results) No results for input(s): PROBNP in the last 8760 hours. HbA1C: No results for input(s): HGBA1C in the last 72 hours. CBG: Recent Labs  Lab 04/04/17 1121 04/04/17 1644 04/04/17 2101 04/05/17 0632 04/05/17 1215  GLUCAP 225* 183* 114* 89 179*   Lipid Profile: No results for input(s): CHOL, HDL, LDLCALC, TRIG, CHOLHDL, LDLDIRECT in the last 72 hours. Thyroid Function Tests: No results for input(s): TSH, T4TOTAL, FREET4, T3FREE, THYROIDAB in the last 72 hours. Anemia Panel: No results for input(s): VITAMINB12, FOLATE, FERRITIN, TIBC, IRON, RETICCTPCT in the last 72 hours. Urine analysis:    Component Value Date/Time    COLORURINE STRAW (A) 09/04/2016 1233   APPEARANCEUR CLEAR 09/04/2016 1233   LABSPEC 1.006 09/04/2016 1233   PHURINE 5.0 09/04/2016 1233   GLUCOSEU NEGATIVE 09/04/2016 1233   HGBUR SMALL (A) 09/04/2016 1233   BILIRUBINUR NEGATIVE 09/04/2016 1233   KETONESUR NEGATIVE 09/04/2016 1233   PROTEINUR 100 (A) 09/04/2016 1233   UROBILINOGEN 1.0 02/13/2015 1934   NITRITE NEGATIVE 09/04/2016 1233   LEUKOCYTESUR NEGATIVE 09/04/2016 1233   Sepsis Labs: @LABRCNTIP (procalcitonin:4,lacticidven:4)  ) Recent Results (from the past 240 hour(s))  MRSA PCR Screening     Status: None   Collection Time: 03/29/17 10:37 PM  Result Value Ref Range Status   MRSA by PCR NEGATIVE NEGATIVE Final    Comment:        The GeneXpert MRSA Assay (FDA approved for NASAL specimens only), is one component of a comprehensive MRSA colonization surveillance program. It is not intended to diagnose MRSA infection nor to guide or monitor treatment for MRSA infections.       Studies: No results found.  Scheduled Meds: . amLODipine  5 mg  Oral Daily  . aspirin EC  81 mg Oral Daily  . atorvastatin  40 mg Oral q1800  . docusate sodium  100 mg Oral Daily  . enoxaparin (LOVENOX) injection  40 mg Subcutaneous Daily  . fluticasone  1 spray Each Nare Daily  . gabapentin  600 mg Oral BID  . insulin aspart  0-15 Units Subcutaneous TID WC  . insulin aspart  0-5 Units Subcutaneous QHS  . insulin detemir  40 Units Subcutaneous Daily  . ipratropium-albuterol  3 mL Nebulization BID  . isosorbide-hydrALAZINE  2 tablet Oral TID  . latanoprost  1 drop Both Eyes QHS  . Living Better with Heart Failure Book   Does not apply Once  . metoprolol succinate  100 mg Oral Daily  . sodium chloride flush  3 mL Intravenous Q12H    Continuous Infusions: . sodium chloride       LOS: 7 days     Berle Mull, MD [facility]  If 7PM-7AM, please contact night-coverage [email or web address] Password [email or web address] 04/05/2017, 2:48 PM

## 2017-04-06 ENCOUNTER — Encounter (HOSPITAL_COMMUNITY): Payer: Self-pay | Admitting: Cardiology

## 2017-04-06 ENCOUNTER — Encounter (HOSPITAL_COMMUNITY)
Admission: EM | Disposition: A | Discharge status: Home / Self Care | Payer: Self-pay | Source: Home / Self Care | Attending: Internal Medicine

## 2017-04-06 HISTORY — PX: CORONARY STENT INTERVENTION: CATH118234

## 2017-04-06 HISTORY — PX: RIGHT/LEFT HEART CATH AND CORONARY ANGIOGRAPHY: CATH118266

## 2017-04-06 LAB — BASIC METABOLIC PANEL
ANION GAP: 6 (ref 5–15)
BUN: 24 mg/dL — AB (ref 6–20)
CHLORIDE: 101 mmol/L (ref 101–111)
CO2: 30 mmol/L (ref 22–32)
Calcium: 7.9 mg/dL — ABNORMAL LOW (ref 8.9–10.3)
Creatinine, Ser: 1.63 mg/dL — ABNORMAL HIGH (ref 0.61–1.24)
GFR calc Af Amer: 48 mL/min — ABNORMAL LOW (ref >60)
GFR, EST NON AFRICAN AMERICAN: 41 mL/min — AB (ref >60)
GLUCOSE: 266 mg/dL — AB (ref 65–99)
POTASSIUM: 4.2 mmol/L (ref 3.5–5.1)
Sodium: 137 mmol/L (ref 135–145)

## 2017-04-06 LAB — POCT I-STAT 3, VENOUS BLOOD GAS (G3P V)
Acid-Base Excess: 6 mmol/L — ABNORMAL HIGH (ref 0.0–2.0)
BICARBONATE: 32.7 mmol/L — AB (ref 20.0–28.0)
O2 SAT: 67 %
PH VEN: 7.353 (ref 7.250–7.430)
PO2 VEN: 38 mmHg (ref 32.0–45.0)
TCO2: 34 mmol/L — AB (ref 22–32)
pCO2, Ven: 58.8 mmHg (ref 44.0–60.0)

## 2017-04-06 LAB — MAGNESIUM: Magnesium: 2.2 mg/dL (ref 1.7–2.4)

## 2017-04-06 LAB — CBC
HEMATOCRIT: 26.7 % — AB (ref 39.0–52.0)
HEMOGLOBIN: 8.5 g/dL — AB (ref 13.0–17.0)
MCH: 33.7 pg (ref 26.0–34.0)
MCHC: 31.8 g/dL (ref 30.0–36.0)
MCV: 106 fL — AB (ref 78.0–100.0)
Platelets: 235 10*3/uL (ref 150–400)
RBC: 2.52 MIL/uL — ABNORMAL LOW (ref 4.22–5.81)
RDW: 11.6 % (ref 11.5–15.5)
WBC: 6.6 10*3/uL (ref 4.0–10.5)

## 2017-04-06 LAB — POCT I-STAT 3, ART BLOOD GAS (G3+)
ACID-BASE EXCESS: 6 mmol/L — AB (ref 0.0–2.0)
Bicarbonate: 32 mmol/L — ABNORMAL HIGH (ref 20.0–28.0)
O2 SAT: 99 %
PCO2 ART: 51.4 mmHg — AB (ref 32.0–48.0)
TCO2: 34 mmol/L — AB (ref 22–32)
pH, Arterial: 7.403 (ref 7.350–7.450)
pO2, Arterial: 144 mmHg — ABNORMAL HIGH (ref 83.0–108.0)

## 2017-04-06 LAB — GLUCOSE, CAPILLARY
GLUCOSE-CAPILLARY: 151 mg/dL — AB (ref 65–99)
GLUCOSE-CAPILLARY: 216 mg/dL — AB (ref 65–99)
Glucose-Capillary: 164 mg/dL — ABNORMAL HIGH (ref 65–99)
Glucose-Capillary: 133 mg/dL — ABNORMAL HIGH (ref 65–99)

## 2017-04-06 LAB — POCT ACTIVATED CLOTTING TIME
ACTIVATED CLOTTING TIME: 202 s
ACTIVATED CLOTTING TIME: 241 s
ACTIVATED CLOTTING TIME: 274 s
ACTIVATED CLOTTING TIME: 285 s
Activated Clotting Time: 268 seconds
Activated Clotting Time: 428 seconds
Activated Clotting Time: 263 seconds
Activated Clotting Time: 274 seconds

## 2017-04-06 LAB — PROTIME-INR
INR: 0.99
Prothrombin Time: 13 seconds (ref 11.4–15.2)

## 2017-04-06 SURGERY — RIGHT/LEFT HEART CATH AND CORONARY ANGIOGRAPHY
Anesthesia: LOCAL

## 2017-04-06 MED ORDER — IOPAMIDOL (ISOVUE-370) INJECTION 76%
INTRAVENOUS | Status: DC | PRN
Start: 2017-04-06 — End: 2017-04-06
  Administered 2017-04-06: 240 mL via INTRAVENOUS

## 2017-04-06 MED ORDER — LIDOCAINE HCL (PF) 1 % IJ SOLN
INTRAMUSCULAR | Status: AC
  Filled 2017-04-06: qty 30

## 2017-04-06 MED ORDER — MORPHINE SULFATE (PF) 10 MG/ML IV SOLN
INTRAVENOUS | Status: DC | PRN
  Administered 2017-04-06: 2 mL via INTRAVENOUS

## 2017-04-06 MED ORDER — LIDOCAINE HCL (PF) 1 % IJ SOLN
INTRAMUSCULAR | Status: DC | PRN
  Administered 2017-04-06: 1 mL
  Administered 2017-04-06: 2 mL

## 2017-04-06 MED ORDER — HEPARIN SODIUM (PORCINE) 1000 UNIT/ML IJ SOLN
INTRAMUSCULAR | Status: AC
  Filled 2017-04-06: qty 1

## 2017-04-06 MED ORDER — IOPAMIDOL (ISOVUE-370) INJECTION 76%
INTRAVENOUS | Status: AC
  Filled 2017-04-06: qty 50

## 2017-04-06 MED ORDER — FENTANYL CITRATE (PF) 100 MCG/2ML IJ SOLN
INTRAMUSCULAR | Status: AC
  Filled 2017-04-06: qty 2

## 2017-04-06 MED ORDER — HEPARIN SODIUM (PORCINE) 1000 UNIT/ML IJ SOLN
INTRAMUSCULAR | Status: DC | PRN
  Administered 2017-04-06: 2000 [IU] via INTRAVENOUS
  Administered 2017-04-06: 3000 [IU] via INTRAVENOUS
  Administered 2017-04-06: 2000 [IU] via INTRAVENOUS
  Administered 2017-04-06: 3000 [IU] via INTRAVENOUS
  Administered 2017-04-06 (×2): 2000 [IU] via INTRAVENOUS
  Administered 2017-04-06: 4000 [IU] via INTRAVENOUS
  Administered 2017-04-06: 2000 [IU] via INTRAVENOUS

## 2017-04-06 MED ORDER — ONDANSETRON HCL 4 MG/2ML IJ SOLN: 4.0000 mg | Freq: Four times a day (QID) | INTRAMUSCULAR | Status: DC | PRN

## 2017-04-06 MED ORDER — MIDAZOLAM HCL 2 MG/2ML IJ SOLN
INTRAMUSCULAR | Status: AC
  Filled 2017-04-06: qty 2

## 2017-04-06 MED ORDER — VERAPAMIL HCL 2.5 MG/ML IV SOLN
INTRA_ARTERIAL | Status: DC | PRN
  Administered 2017-04-06: 20 mL via INTRA_ARTERIAL

## 2017-04-06 MED ORDER — HYDRALAZINE HCL 20 MG/ML IJ SOLN: 10.0000 mg | INTRAMUSCULAR | Status: DC | PRN

## 2017-04-06 MED ORDER — SODIUM CHLORIDE 0.9 % IV SOLN
INTRAVENOUS | Status: AC | PRN
  Administered 2017-04-06: 10 mL/h via INTRAVENOUS

## 2017-04-06 MED ORDER — HEPARIN (PORCINE) IN NACL 2-0.9 UNIT/ML-% IJ SOLN
INTRAMUSCULAR | Status: AC | PRN
  Administered 2017-04-06: 1000 mL

## 2017-04-06 MED ORDER — HEPARIN (PORCINE) IN NACL 2-0.9 UNIT/ML-% IJ SOLN
INTRAMUSCULAR | Status: AC
  Filled 2017-04-06: qty 1000

## 2017-04-06 MED ORDER — LABETALOL HCL 5 MG/ML IV SOLN: 10.0000 mg | INTRAVENOUS | Status: AC | PRN

## 2017-04-06 MED ORDER — NITROGLYCERIN 1 MG/10 ML FOR IR/CATH LAB
INTRA_ARTERIAL | Status: AC
  Filled 2017-04-06: qty 10

## 2017-04-06 MED ORDER — SODIUM CHLORIDE 0.9 % IV SOLN: 250.0000 mL | INTRAVENOUS | Status: AC | PRN

## 2017-04-06 MED ORDER — FUROSEMIDE 10 MG/ML IJ SOLN
INTRAMUSCULAR | Status: DC | PRN
  Administered 2017-04-06: 40 mg via INTRAVENOUS

## 2017-04-06 MED ORDER — CLOPIDOGREL BISULFATE 300 MG PO TABS
ORAL_TABLET | ORAL | Status: DC | PRN
  Administered 2017-04-06: 600 mg via ORAL

## 2017-04-06 MED ORDER — CLOPIDOGREL BISULFATE 300 MG PO TABS
ORAL_TABLET | ORAL | Status: AC
  Filled 2017-04-06: qty 2

## 2017-04-06 MED ORDER — ACETAMINOPHEN 325 MG PO TABS: 650.0000 mg | ORAL_TABLET | ORAL | Status: DC | PRN

## 2017-04-06 MED ORDER — HYDRALAZINE HCL 20 MG/ML IJ SOLN: 5.0000 mg | INTRAMUSCULAR | Status: AC | PRN

## 2017-04-06 MED ORDER — IOPAMIDOL (ISOVUE-370) INJECTION 76%
INTRAVENOUS | Status: AC
  Filled 2017-04-06: qty 100

## 2017-04-06 MED ORDER — MORPHINE SULFATE (PF) 10 MG/ML IV SOLN
INTRAVENOUS | Status: AC
  Filled 2017-04-06: qty 1

## 2017-04-06 MED ORDER — FENTANYL CITRATE (PF) 100 MCG/2ML IJ SOLN
INTRAMUSCULAR | Status: DC | PRN
  Administered 2017-04-06 (×2): 25 ug via INTRAVENOUS
  Administered 2017-04-06: 50 ug via INTRAVENOUS
  Administered 2017-04-06: 25 ug via INTRAVENOUS

## 2017-04-06 MED ORDER — NITROGLYCERIN 1 MG/10 ML FOR IR/CATH LAB
INTRA_ARTERIAL | Status: DC | PRN
Start: 2017-04-06 — End: 2017-04-06
  Administered 2017-04-06: 200 ug via INTRACORONARY

## 2017-04-06 MED ORDER — SODIUM CHLORIDE 0.9% FLUSH: 3.0000 mL | Freq: Two times a day (BID) | INTRAVENOUS | Status: DC

## 2017-04-06 MED ORDER — HEPARIN (PORCINE) IN NACL 2-0.9 UNIT/ML-% IJ SOLN
INTRAMUSCULAR | Status: AC
  Filled 2017-04-06: qty 500

## 2017-04-06 MED ORDER — NITROGLYCERIN IN D5W 200-5 MCG/ML-% IV SOLN
INTRAVENOUS | Status: AC
  Filled 2017-04-06: qty 250

## 2017-04-06 MED ORDER — MIDAZOLAM HCL 2 MG/2ML IJ SOLN
INTRAMUSCULAR | Status: DC | PRN
  Administered 2017-04-06 (×2): 1 mg via INTRAVENOUS

## 2017-04-06 MED ORDER — FUROSEMIDE 10 MG/ML IJ SOLN
INTRAMUSCULAR | Status: AC
  Filled 2017-04-06: qty 4

## 2017-04-06 MED ORDER — ANGIOPLASTY BOOK
Freq: Once | Status: AC
  Administered 2017-04-06: 22:00:00 1
  Filled 2017-04-06: qty 1

## 2017-04-06 MED ORDER — SODIUM CHLORIDE 0.9% FLUSH: 3.0000 mL | INTRAVENOUS | Status: DC | PRN

## 2017-04-06 MED ORDER — VERAPAMIL HCL 2.5 MG/ML IV SOLN
INTRAVENOUS | Status: AC
  Filled 2017-04-06: qty 2

## 2017-04-06 MED ORDER — HEPARIN (PORCINE) IN NACL 100-0.45 UNIT/ML-% IJ SOLN
1400.0000 [IU]/h | INTRAMUSCULAR | Status: DC
  Administered 2017-04-06: 22:00:00 1400 [IU]/h via INTRAVENOUS
  Filled 2017-04-06 (×2): qty 250

## 2017-04-06 MED ORDER — NITROGLYCERIN IN D5W 200-5 MCG/ML-% IV SOLN
INTRAVENOUS | Status: AC | PRN
  Administered 2017-04-06: 40 ug/min via INTRAVENOUS

## 2017-04-06 MED ORDER — CLOPIDOGREL BISULFATE 75 MG PO TABS
75.0000 mg | ORAL_TABLET | Freq: Every day | ORAL | Status: DC
  Administered 2017-04-07 – 2017-04-09 (×3): 75 mg via ORAL
  Filled 2017-04-06 (×3): qty 1

## 2017-04-06 SURGICAL SUPPLY — 28 items
BALLN MAVERICK OTW 1.5X15 (BALLOONS) ×2
BALLN SAPPHIRE 2.5X10 (BALLOONS) ×2
BALLOON MAVERICK OTW 1.5X15 (BALLOONS) ×1 IMPLANT
BALLOON SAPPHIRE 2.5X10 (BALLOONS) ×1 IMPLANT
CATH BALLN WEDGE 5F 110CM (CATHETERS) ×2 IMPLANT
CATH INFINITI 5 FR JL3.5 (CATHETERS) ×2 IMPLANT
CATH INFINITI JR4 5F (CATHETERS) ×2 IMPLANT
CATH LAUNCHER 6FR EBU3.5 (CATHETERS) ×2 IMPLANT
CATH TRAPPER 6-8F (CATHETERS) ×2 IMPLANT
DEVICE RAD COMP TR BAND LRG (VASCULAR PRODUCTS) ×2 IMPLANT
GLIDESHEATH SLEND A-KIT 6F 22G (SHEATH) ×2 IMPLANT
GUIDELINER 6F (CATHETERS) ×2 IMPLANT
GUIDEWIRE INQWIRE 1.5J.035X260 (WIRE) ×1 IMPLANT
INQWIRE 1.5J .035X260CM (WIRE) ×2
KIT ENCORE 26 ADVANTAGE (KITS) ×2 IMPLANT
KIT ESSENTIALS PG (KITS) ×2 IMPLANT
KIT HEART LEFT (KITS) ×2 IMPLANT
PACK CARDIAC CATHETERIZATION (CUSTOM PROCEDURE TRAY) ×2 IMPLANT
SHEATH GLIDE SLENDER 4/5FR (SHEATH) ×2 IMPLANT
STENT RESOLUTE ONYX 2.75X26 (Permanent Stent) ×2 IMPLANT
TRANSDUCER W/STOPCOCK (MISCELLANEOUS) ×2 IMPLANT
TUBING CIL FLEX 10 FLL-RA (TUBING) ×2 IMPLANT
VALVE GUARDIAN II ~~LOC~~ HEMO (MISCELLANEOUS) ×2 IMPLANT
WIRE EMERALD 3MM-J .025X260CM (WIRE) ×2 IMPLANT
WIRE MAILMAN 182CM (WIRE) ×2 IMPLANT
WIRE MARVEL STR TIP 190CM (WIRE) ×2 IMPLANT
WIRE RUNTHROUGH .014X180CM (WIRE) ×2 IMPLANT
WIRE RUNTHROUGH EXTENSION (WIRE) ×2 IMPLANT

## 2017-04-06 NOTE — Progress Notes (Signed)
PT Cancellation Note  Patient Details Name: David [name] MRN: [id number] DOB: 11/17/1945   Cancelled Treatment:    Reason Eval/Treat Not Completed: Patient at procedure or test/unavailable. Will follow-up for PT treatment as schedule permits.  Mabeline Caras, PT, DPT Acute Rehab Services  Pager: [place] 04/06/2017, 9:07 AM

## 2017-04-06 NOTE — Progress Notes (Signed)
PROGRESS NOTE  David [name] HYQ:[id number] DOB: Nov 15, 1945 DOA: 03/29/2017 PCP: Eloise Levels, NP (Inactive)  HPI/Recap of past 46 hours: 71 year old male with medical history significant for hypertension, diabetes, CKD stage II, chronic systolic CHF, chronic back pain, presented to the ED with dyspnea for about 3 days.  Upon arrival of EMS patient was found to be saturating 74% on room air and was treated with BiPAP. Breathing is better, no chest pain abdominal pain.  Tolerated cardiac cath well.  Assessment/Plan: Principal Problem:   Acute hypoxemic respiratory failure (HCC) Active Problems:   Hypertension   Chronic back pain   Hypertensive urgency   Diabetes mellitus ([place])   CKD (chronic kidney disease), stage II   Insulin-requiring or dependent type II diabetes mellitus ([place])   Pneumonia  #Acute hypoxemic respiratory failure Resolving From CHF acute on chronic BNP 119, Echo with normal EF and normal diastolic function CT angiogram couldn't be completed due to malfunction CT.  VQ scan negative for PE Doppler lower extremities negative for DVT Oxygen as needed PT/OT Monitor closely  #Multifocal pneumonia, ruled out Unlikely, no symptoms, not ill-appearing Currently afebrile, no leukocytosis Flu panel neg Initial chest x-ray showed possible multifocal pneumonia, repeat CXR negative Discontinued IV Rocephin and Zithromax  #Acute on chronic combined diastolic CHF  Previous echo showed EF 40-45 with global hypokinesis Echo done on this admission showed normal EF with no diastolic dysfunction Cardiology on board Treated with IV lasix 40mg  daily, currently on hold Losartan, metoprolol, bidil dose increase metoprolol and amlodipine per cardiology as well.  #AK I on CKD stage III Resolved and renal function at baseline Likely due to contrast exposure S/p 1L of IVF on 11/13 Daily BMP Monitor renal function  #CAD Continue aspirin, Lipitor, bidil Event left  cardiac cath, distal LAD PCI performed.  Staged PCI to mid LAD plan for tomorrow. Continue aspirin and Plavix and heparin and nitroglycerin infusion.  #Hypertension Losartan, bidil, Lopressor changed to Toprol  #Type II DM Continue Levemir, SSI  Suspect sleep apnea. Patient has increased daytime sleepiness, fatigue, morning headaches, sudden awakening from his sleep with trouble breathing even when he does not have any volume overload.  Symptoms are consistent and concerning for sleep apnea recommend outpatient sleep study.   Code Status: Full  Family Communication: Wife at bedside  Disposition Plan: Pending improvement   Consultants:  Cardiology  Procedures:  None  Antimicrobials:  None  Status post IV ceftriaxone and azithromycin, discontinued  DVT prophylaxis: Lovenox   Objective: Vitals:   04/06/17 1129 04/06/17 1134 04/06/17 1139 04/06/17 1144  BP:    (!) 165/146  Pulse: (!) 0 (!) 0 (!) 0 71  Resp: (!) 0 (!) 0 (!) 0 17  Temp:    97.6 F (36.4 C)  TempSrc:    Oral  SpO2: (!) 0% (!) 0% (!) 0% 93%  Weight:      Height:        Intake/Output Summary (Last 24 hours) at 04/06/2017 1632 Last data filed at 04/06/2017 1304 Gross per 24 hour  Intake 1299.17 ml  Output 2825 ml  Net -1525.83 ml   Filed Weights   04/04/17 0600 04/05/17 0550 04/06/17 0416  Weight: 103.9 kg (229 lb 1.6 oz) 102.8 kg (226 lb 11.2 oz) 104.1 kg (229 lb 8 oz)    Exam:   General: Alert, awake, oriented x3  Cardiovascular: S1-S2 present, no added heart sounds  Respiratory: Chest clear bilaterally, no added sounds  Abdomen: Soft, nondistended, nontender  Musculoskeletal: Trace ankle edema on the right, none on the left  Skin: Normal  Psychiatry: Normal mood   Data Reviewed: CBC: Recent Labs  Lab 03/31/17 0253 04/01/17 0251 04/02/17 0926 04/03/17 0603 04/06/17 0235  WBC 5.9 5.8 6.9 7.1 6.6  HGB 9.5* 8.7* 9.7* 9.1* 8.5*  HCT 29.9* 27.3* 30.4* 28.7* 26.7*  MCV  107.2* 105.0* 105.9* 105.9* 106.0*  PLT 221 209 236 227 027   Basic Metabolic Panel: Recent Labs  Lab 04/02/17 0926 04/03/17 0603 04/04/17 1116 04/05/17 0525 04/06/17 0235  NA 139 139 136 137 137  K 3.9 4.0 3.9 4.1 4.2  CL 102 101 99* 99* 101  CO2 29 30 29  32 30  GLUCOSE 196* 89 229* 96 266*  BUN 15 18 20  25* 24*  CREATININE 1.46* 1.44* 1.41* 1.64* 1.63*  CALCIUM 8.5* 8.5* 8.5* 8.5* 7.9*  MG  --   --  2.2  --  2.2   GFR: Estimated Creatinine Clearance: 55.1 mL/min (A) (by C-G formula based on SCr of 1.63 mg/dL (H)). Liver Function Tests: No results for input(s): AST, ALT, ALKPHOS, BILITOT, PROT, ALBUMIN in the last 168 hours. No results for input(s): LIPASE, AMYLASE in the last 168 hours. No results for input(s): AMMONIA in the last 168 hours. Coagulation Profile: Recent Labs  Lab 04/06/17 0543  INR 0.99   Cardiac Enzymes: Recent Labs  Lab 04/01/17 1521  TROPONINI <0.03   BNP (last 3 results) No results for input(s): PROBNP in the last 8760 hours. HbA1C: No results for input(s): HGBA1C in the last 72 hours. CBG: Recent Labs  Lab 04/05/17 1215 04/05/17 1659 04/05/17 2018 04/06/17 0609 04/06/17 1231  GLUCAP 179* 150* 179* 164* 133*   Lipid Profile: No results for input(s): CHOL, HDL, LDLCALC, TRIG, CHOLHDL, LDLDIRECT in the last 72 hours. Thyroid Function Tests: No results for input(s): TSH, T4TOTAL, FREET4, T3FREE, THYROIDAB in the last 72 hours. Anemia Panel: No results for input(s): VITAMINB12, FOLATE, FERRITIN, TIBC, IRON, RETICCTPCT in the last 72 hours. Urine analysis:    Component Value Date/Time   COLORURINE STRAW (A) 09/04/2016 1233   APPEARANCEUR CLEAR 09/04/2016 1233   LABSPEC 1.006 09/04/2016 1233   PHURINE 5.0 09/04/2016 1233   GLUCOSEU NEGATIVE 09/04/2016 1233   HGBUR SMALL (A) 09/04/2016 1233   BILIRUBINUR NEGATIVE 09/04/2016 1233   KETONESUR NEGATIVE 09/04/2016 1233   PROTEINUR 100 (A) 09/04/2016 1233   UROBILINOGEN 1.0 02/13/2015  1934   NITRITE NEGATIVE 09/04/2016 1233   LEUKOCYTESUR NEGATIVE 09/04/2016 1233   Sepsis Labs: @LABRCNTIP (procalcitonin:4,lacticidven:4)  ) Recent Results (from the past 240 hour(s))  MRSA PCR Screening     Status: None   Collection Time: 03/29/17 10:37 PM  Result Value Ref Range Status   MRSA by PCR NEGATIVE NEGATIVE Final    Comment:        The GeneXpert MRSA Assay (FDA approved for NASAL specimens only), is one component of a comprehensive MRSA colonization surveillance program. It is not intended to diagnose MRSA infection nor to guide or monitor treatment for MRSA infections.       Studies: No results found.  Scheduled Meds: . amLODipine  5 mg Oral Daily  . aspirin EC  81 mg Oral Daily  . atorvastatin  40 mg Oral q1800  . [START ON 04/07/2017] clopidogrel  75 mg Oral Daily  . docusate sodium  100 mg Oral Daily  . fluticasone  1 spray Each Nare Daily  . gabapentin  600 mg Oral BID  . insulin aspart  0-15 Units Subcutaneous TID WC  . insulin aspart  0-5 Units Subcutaneous QHS  . insulin detemir  40 Units Subcutaneous Daily  . ipratropium-albuterol  3 mL Nebulization BID  . isosorbide-hydrALAZINE  2 tablet Oral TID  . latanoprost  1 drop Both Eyes QHS  . Living Better with Heart Failure Book   Does not apply Once  . metoprolol succinate  100 mg Oral Daily  . sodium chloride flush  3 mL Intravenous Q12H  . sodium chloride flush  3 mL Intravenous Q12H    Continuous Infusions: . sodium chloride    . sodium chloride    . heparin       LOS: 8 days     Berle Mull, MD [facility]  If 7PM-7AM, please contact night-coverage [email or web address] Password [email or web address] 04/06/2017, 4:32 PM

## 2017-04-06 NOTE — Progress Notes (Signed)
Subjective:  Feels well.  No chest pain Shortness of breath during cath procedure, now improved.  Objective:  Vital Signs in the last 24 hours: Temp:  [97.6 F (36.4 C)-98.1 F (36.7 C)] 97.6 F (36.4 C) (11/19 1144) Pulse Rate:  [0-95] 71 (11/19 1144) Resp:  [0-27] 17 (11/19 1144) BP: (0-203)/(0-170) 165/146 (11/19 1144) SpO2:  [0 %-100 %] 93 % (11/19 1144) Weight:  [104.1 kg (229 lb 8 oz)] 104.1 kg (229 lb 8 oz) (11/19 0416)  Intake/Output from previous day: 11/18 0701 - 11/19 0700 In: 1419.2 [P.O.:960; I.V.:459.2] Out: 1000 [Urine:1000] Intake/Output from this shift: Total I/O In: 360 [P.O.:360] Out: 2325 [Urine:2325]  Physical Exam: Nursing note and vitals reviewed. Constitutional: He is oriented to person, place, and time. He appears well-developed.  Mildly obese  HENT:  Head: Normocephalic and atraumatic.  Neck: No JVD present.  Cardiovascular: Normal rate and regular rhythm.  No murmur (I/VI LLSB and apex) heard. Respiratory: Effort normal. He has no rales.  GI: Soft. Bowel sounds are normal.  Musculoskeletal: He exhibits trace edema.  Neurological: He is alert and oriented to person, place, and time.  Skin: Skin is warm.     Lab Results: Recent Labs    04/06/17 0235  WBC 6.6  HGB 8.5*  PLT 235   Recent Labs    04/05/17 0525 04/06/17 0235  NA 137 137  K 4.1 4.2  CL 99* 101  CO2 32 30  GLUCOSE 96 266*  BUN 25* 24*  CREATININE 1.64* 1.63*    Cardiac Studies:Procedures   CORONARY STENT INTERVENTION  RIGHT/LEFT HEART CATH AND CORONARY ANGIOGRAPHY  Conclusion   Mild-to-moderate RCA and circumflex disease. Severely calcified 80-90% mid to distal LAD stenoses. Pulmonary hypertension WHO group II Successful PTCA and stent placement-distal LAD stenosis with resolute 2.75 x 26 cm stent. PTCA 90% mid LAD stenosis, unsuccessful stent placement.  Recommendations: Observe the patient in telemetry overnight. Continue IV heparin. Continue aspirin  81 mg and Plavix 75 mg. Recommend gentle hydration with 25 mL an hour fluids. Monitor urine output closely.    Assessment: 71 y/o AAM w/uncontrolled hypertension, type 2 DM, CKD III, admitted with episode of shortness of breath  Shortness of breath Angina equivalent  CAD s/p mid-distal LAD PCI today, Staged PCI to mid LAD tomorrow 04/07/2017 Hypertension Type 2 DM Prior abnormal stress test  Recommendations: Staged LAD PCI tomorrow 04/07/2017. NPO after midnight. Continue ASA 81 mg, plavix 75 mg daily. Conitnue IV heparin overnight Gentle hydration Continue lipitor 40 mg Continue metoprolol succinate 100 mg daily, losartan 50 mg daily, Bidil 20-37.5 tid Hold lasix today.    LOS: 8 days    Naleigha Raimondi J Rhianna Raulerson 04/06/2017, 2:59 PM

## 2017-04-06 NOTE — H&P (View-Only) (Signed)
Subjective:  Feels well.  No chest pain Shortness of breath during cath procedure, now improved.  Objective:  Vital Signs in the last 24 hours: Temp:  [97.6 F (36.4 C)-98.1 F (36.7 C)] 97.6 F (36.4 C) (11/19 1144) Pulse Rate:  [0-95] 71 (11/19 1144) Resp:  [0-27] 17 (11/19 1144) BP: (0-203)/(0-170) 165/146 (11/19 1144) SpO2:  [0 %-100 %] 93 % (11/19 1144) Weight:  [104.1 kg (229 lb 8 oz)] 104.1 kg (229 lb 8 oz) (11/19 0416)  Intake/Output from previous day: 11/18 0701 - 11/19 0700 In: 1419.2 [P.O.:960; I.V.:459.2] Out: 1000 [Urine:1000] Intake/Output from this shift: Total I/O In: 360 [P.O.:360] Out: 2325 [Urine:2325]  Physical Exam: Nursing note and vitals reviewed. Constitutional: He is oriented to person, place, and time. He appears well-developed.  Mildly obese  HENT:  Head: Normocephalic and atraumatic.  Neck: No JVD present.  Cardiovascular: Normal rate and regular rhythm.  No murmur (I/VI LLSB and apex) heard. Respiratory: Effort normal. He has no rales.  GI: Soft. Bowel sounds are normal.  Musculoskeletal: He exhibits trace edema.  Neurological: He is alert and oriented to person, place, and time.  Skin: Skin is warm.     Lab Results: Recent Labs    04/06/17 0235  WBC 6.6  HGB 8.5*  PLT 235   Recent Labs    04/05/17 0525 04/06/17 0235  NA 137 137  K 4.1 4.2  CL 99* 101  CO2 32 30  GLUCOSE 96 266*  BUN 25* 24*  CREATININE 1.64* 1.63*    Cardiac Studies:Procedures   CORONARY STENT INTERVENTION  RIGHT/LEFT HEART CATH AND CORONARY ANGIOGRAPHY  Conclusion   Mild-to-moderate RCA and circumflex disease. Severely calcified 80-90% mid to distal LAD stenoses. Pulmonary hypertension WHO group II Successful PTCA and stent placement-distal LAD stenosis with resolute 2.75 x 26 cm stent. PTCA 90% mid LAD stenosis, unsuccessful stent placement.  Recommendations: Observe the patient in telemetry overnight. Continue IV heparin. Continue aspirin  81 mg and Plavix 75 mg. Recommend gentle hydration with 25 mL an hour fluids. Monitor urine output closely.    Assessment: 71 y/o AAM w/uncontrolled hypertension, type 2 DM, CKD III, admitted with episode of shortness of breath  Shortness of breath Angina equivalent  CAD s/p mid-distal LAD PCI today, Staged PCI to mid LAD tomorrow 04/07/2017 Hypertension Type 2 DM Prior abnormal stress test  Recommendations: Staged LAD PCI tomorrow 04/07/2017. NPO after midnight. Continue ASA 81 mg, plavix 75 mg daily. Conitnue IV heparin overnight Gentle hydration Continue lipitor 40 mg Continue metoprolol succinate 100 mg daily, losartan 50 mg daily, Bidil 20-37.5 tid Hold lasix today.    LOS: 8 days    Dilana Mcphie J Tiffannie Sloss 04/06/2017, 2:59 PM

## 2017-04-06 NOTE — Care Management Note (Addendum)
Case Management Note  Patient Details  Name: David [name] MRN: [id number] Date of Birth: 06-Apr-1946  Subjective/Objective:  From home, s/p coronary stent intervention, will be on plavix.  Patient was set up with outpatient pt by previous NCM.   He is still interested in getting a sleep study done, per attending he spoke with patient and he will do this as outpatient thru his PCP.   11/21 [place], BSN - patient was set up for outpatient pt by previous NCM, patient still wants out patient pt,not HHPT.              Action/Plan: NCM will follow for dc needs.   Expected Discharge Date:  04/04/17               Expected Discharge Plan:  Home/Self Care  In-House Referral:  NA  Discharge planning Services  CM Consult  Post Acute Care Choice:  NA Choice offered to:  NA  DME Arranged:  N/A DME Agency:  NA  HH Arranged:  NA HH Agency:  NA  Status of Service:  Completed, signed off  If discussed at [place] of Stay Meetings, dates discussed:    Additional Comments:  Zenon Mayo, RN 04/06/2017, 3:22 PM

## 2017-04-06 NOTE — Progress Notes (Signed)
[place] for heparin  Indication: chest pain/ACS  Allergies  Allergen Reactions  . No Known Allergies     Patient Measurements: Height: 6\' 2"  (188 cm) Weight: 229 lb 8 oz (104.1 kg) IBW/kg (Calculated) : 82.2 Heparin Dosing Weight: 102kg  Vital Signs: Temp: 97.6 F (36.4 C) (11/19 1144) Temp Source: Oral (11/19 1144) BP: 165/146 (11/19 1144) Pulse Rate: 71 (11/19 1144)  Labs: Recent Labs    04/04/17 1116 04/05/17 0525 04/06/17 0235 04/06/17 0543  HGB  --   --  8.5*  --   HCT  --   --  26.7*  --   PLT  --   --  235  --   LABPROT  --   --   --  13.0  INR  --   --   --  0.99  CREATININE 1.41* 1.64* 1.63*  --     Estimated Creatinine Clearance: 55.1 mL/min (A) (by C-G formula based on SCr of 1.63 mg/dL (H)).   Medical History: Past Medical History:  Diagnosis Date  . Arthritis   . CHF (congestive heart failure) ([place])   . DDD (degenerative disc disease), cervical   . Diabetes mellitus without complication ([place])    type II  . Diabetic neuropathy ([place])   . GERD (gastroesophageal reflux disease)   . Glaucoma   . Hypertension   . Renal disorder    Stage II    Assessment: 71 yo male s/p cath with stent placement to distal LAD with plans for staged PCI to proximal LAD on 11/20. Pharmacy to start heparin 8 hours post sheath removal (removed ~ 11:17am; TR band applied)  Goal of Therapy:  Heparin level 0.3-0.7 units/ml Monitor platelets by anticoagulation protocol: Yes   Plan:  -No heparin bolus -Start heparin at 1400 units/hr at 7:30pm -Heparin level daily wth CBC daily  Hildred Laser, Pharm D 04/06/2017 12:01 PM

## 2017-04-06 NOTE — Progress Notes (Signed)
Pt was offered to shave his right groin and right wrist, but he declined the offer and stated that he already shave.

## 2017-04-06 NOTE — Progress Notes (Signed)
OT Cancellation Note  Patient Details Name: David [name] MRN: [id number] DOB: 1945-07-09   Cancelled Treatment:    Reason Eval/Treat Not Completed: Patient at procedure or test/ unavailable. Pt off unit for procedure. Will check back as able to progress with POC.   Norman Herrlich, MS OTR/L  Pager: Joyce A Phelan Schadt 04/06/2017, 8:30 AM

## 2017-04-06 NOTE — Progress Notes (Signed)
BRACHIAL SITE SHEATH REMOVAL   Sheath removed at 1400 and pressure held for 15 minutes site level 0 before sheath pull and level 0 after sheath pull. Dressing applied and instructions given to patient with verbalized understanding,   TR BAND REMOVAL  LOCATION:    right radial  DEFLATED PER PROTOCOL:    Yes.    TIME BAND OFF / DRESSING APPLIED:    [place] ARRIVAL:    Level 0  SITE AFTER BAND REMOVAL:    Level 0  CIRCULATION SENSATION AND MOVEMENT:    Within Normal Limits   Yes.    COMMENTS: Radial and brachial sites checked frequently during shift with no change in assessment noted. Dressings remain dry and intact.

## 2017-04-06 NOTE — Interval H&P Note (Signed)
History and Physical Interval Note:  04/06/2017 7:31 AM  David [name]  has presented today for surgery, with the diagnosis of unstable angina  The various methods of treatment have been discussed with the patient and family. After consideration of risks, benefits and other options for treatment, the patient has consented to  Procedure(s): RIGHT/LEFT HEART CATH AND CORONARY ANGIOGRAPHY (N/A) as a surgical intervention .  The patient's history has been reviewed, patient examined, no change in status, stable for surgery.  I have reviewed the patient's chart and labs.  Questions were answered to the patient's satisfaction.     2016/2017 Appropriate Use Criteria for Coronary Revascularization Clinical Presentation: Diabetes Mellitus? Symptom Status? S/P CABG? Antianginal Therapy (# of long-acting drugs)? Results of Non-invasive testing? FFR/iFR results in all diseased vessels? Patient undergoing renal transplant? Patient undergoing percutaneous valve procedure (TAVR, MitraClip, Others)? Symptom Status:  Ischemic Symptoms  Non-invasive Testing:  Low risk  If no or indeterminate stress test, FFR/iFR results in all diseased vessels:  N/A  Diabetes Mellitus:  Yes  S/P CABG:  No  Antianginal therapy (number of long-acting drugs):  >=2  Patient undergoing renal transplant:  No  Patient undergoing percutaneous valve procedure:  No    newline 1 Vessel Disease PCI CABG  No proximal LAD involvement, No proximal left dominant LCX involvement A (7); Indication 1 M (5); Indication 1   Proximal left dominant LCX involvement A (7); Indication 4 A (7); Indication 4   Proximal LAD involvement A (7); Indication 4 A (7); Indication 4   newline 2 Vessel Disease  No proximal LAD involvement A (7); Indication 7 M (6); Indication 7   Proximal LAD involvement A (7); Indication 13 A (8); Indication 13   newline 3 Vessel Disease  Low disease complexity (e.g., focal stenoses, SYNTAX <=22) A (7); Indication  18 A (8); Indication 18   Intermediate or high disease complexity (e.g., SYNTAX >=23) M (6); Indication 22 A (9); Indication 22   newline Left Main Disease  Isolated LMCA disease: ostial or midshaft A (7); Indication 24 A (9); Indication 24   Isolated LMCA disease: bifurcation involvement M (6); Indication 25 A (9); Indication 25   LMCA ostial or midshaft, concurrent low disease burden multivessel disease (e.g., 1-2 additional focal stenoses, SYNTAX <=22) A (7); Indication 26 A (9); Indication 26   LMCA ostial or midshaft, concurrent intermediate or high disease burden multivessel disease (e.g., 1-2 additional bifurcation stenoses, long stenoses, SYNTAX >=23) M (4); Indication 27 A (9); Indication 27   LMCA bifurcation involvement, concurrent low disease burden multivessel disease (e.g., 1-2 additional focal stenoses, SYNTAX <=22) M (6); Indication 28 A (9); Indication 28   LMCA bifurcation involvement, concurrent intermediate or high disease burden multivessel disease (e.g., 1-2 additional bifurcation stenoses, long stenoses, SYNTAX >=23) R (3); Indication 29 A (9); Indication [place]

## 2017-04-07 ENCOUNTER — Inpatient Hospital Stay (HOSPITAL_COMMUNITY)
Admission: EM | Disposition: A | Discharge status: Home / Self Care | Payer: Self-pay | Source: Home / Self Care | Attending: Internal Medicine

## 2017-04-07 ENCOUNTER — Encounter (HOSPITAL_COMMUNITY): Payer: Self-pay | Admitting: Cardiology

## 2017-04-07 ENCOUNTER — Inpatient Hospital Stay (HOSPITAL_COMMUNITY): Payer: Medicare Other

## 2017-04-07 DIAGNOSIS — M79609 Pain in unspecified limb: Secondary | ICD-10-CM

## 2017-04-07 HISTORY — PX: CORONARY STENT INTERVENTION: CATH118234

## 2017-04-07 LAB — CBC WITH DIFFERENTIAL/PLATELET
BASOS PCT: 0 %
Basophils Absolute: 0 10*3/uL (ref 0.0–0.1)
EOS ABS: 0.1 10*3/uL (ref 0.0–0.7)
Eosinophils Relative: 1 %
HCT: 25 % — ABNORMAL LOW (ref 39.0–52.0)
HEMOGLOBIN: 8.2 g/dL — AB (ref 13.0–17.0)
Lymphocytes Relative: 24 %
Lymphs Abs: 2.1 10*3/uL (ref 0.7–4.0)
MCH: 34.9 pg — ABNORMAL HIGH (ref 26.0–34.0)
MCHC: 32.8 g/dL (ref 30.0–36.0)
MCV: 106.4 fL — ABNORMAL HIGH (ref 78.0–100.0)
Monocytes Absolute: 0.6 10*3/uL (ref 0.1–1.0)
Monocytes Relative: 7 %
NEUTROS PCT: 68 %
Neutro Abs: 5.9 10*3/uL (ref 1.7–7.7)
Platelets: 258 10*3/uL (ref 150–400)
RBC: 2.35 MIL/uL — AB (ref 4.22–5.81)
RDW: 12.1 % (ref 11.5–15.5)
WBC: 8.6 10*3/uL (ref 4.0–10.5)

## 2017-04-07 LAB — POCT ACTIVATED CLOTTING TIME
ACTIVATED CLOTTING TIME: 235 s
ACTIVATED CLOTTING TIME: 307 s
Activated Clotting Time: 252 seconds
Activated Clotting Time: 257 seconds
Activated Clotting Time: 301 seconds
Activated Clotting Time: 307 seconds

## 2017-04-07 LAB — BASIC METABOLIC PANEL
Anion gap: 5 (ref 5–15)
BUN: 21 mg/dL — ABNORMAL HIGH (ref 6–20)
CALCIUM: 7.9 mg/dL — AB (ref 8.9–10.3)
CO2: 29 mmol/L (ref 22–32)
Chloride: 102 mmol/L (ref 101–111)
Creatinine, Ser: 1.51 mg/dL — ABNORMAL HIGH (ref 0.61–1.24)
GFR, EST AFRICAN AMERICAN: 53 mL/min — AB (ref >60)
GFR, EST NON AFRICAN AMERICAN: 45 mL/min — AB (ref >60)
Glucose, Bld: 162 mg/dL — ABNORMAL HIGH (ref 65–99)
Potassium: 3.9 mmol/L (ref 3.5–5.1)
SODIUM: 136 mmol/L (ref 135–145)

## 2017-04-07 LAB — GLUCOSE, CAPILLARY
GLUCOSE-CAPILLARY: 163 mg/dL — AB (ref 65–99)
GLUCOSE-CAPILLARY: 166 mg/dL — AB (ref 65–99)
Glucose-Capillary: 176 mg/dL — ABNORMAL HIGH (ref 65–99)
Glucose-Capillary: 320 mg/dL — ABNORMAL HIGH (ref 65–99)

## 2017-04-07 LAB — MAGNESIUM: MAGNESIUM: 2.1 mg/dL (ref 1.7–2.4)

## 2017-04-07 LAB — HEPARIN LEVEL (UNFRACTIONATED): HEPARIN UNFRACTIONATED: 0.19 [IU]/mL — AB (ref 0.30–0.70)

## 2017-04-07 SURGERY — CORONARY STENT INTERVENTION
Anesthesia: LOCAL

## 2017-04-07 MED ORDER — SODIUM CHLORIDE 0.9 % IV SOLN: INTRAVENOUS | Status: DC

## 2017-04-07 MED ORDER — ACETAMINOPHEN 325 MG PO TABS: 650.0000 mg | ORAL_TABLET | ORAL | Status: DC | PRN

## 2017-04-07 MED ORDER — FENTANYL CITRATE (PF) 100 MCG/2ML IJ SOLN
INTRAMUSCULAR | Status: AC
  Filled 2017-04-07: qty 2

## 2017-04-07 MED ORDER — HEPARIN (PORCINE) IN NACL 2-0.9 UNIT/ML-% IJ SOLN
INTRAMUSCULAR | Status: AC | PRN
  Administered 2017-04-07: 1000 mL

## 2017-04-07 MED ORDER — SODIUM CHLORIDE 0.9 % IV SOLN: 250.0000 mL | INTRAVENOUS | Status: DC | PRN

## 2017-04-07 MED ORDER — SODIUM CHLORIDE 0.9% FLUSH: 3.0000 mL | INTRAVENOUS | Status: DC | PRN

## 2017-04-07 MED ORDER — IOPAMIDOL (ISOVUE-370) INJECTION 76%
INTRAVENOUS | Status: AC
  Filled 2017-04-07: qty 50

## 2017-04-07 MED ORDER — FENTANYL CITRATE (PF) 100 MCG/2ML IJ SOLN
INTRAMUSCULAR | Status: DC | PRN
  Administered 2017-04-07: 50 ug via INTRAVENOUS
  Administered 2017-04-07 (×4): 25 ug via INTRAVENOUS

## 2017-04-07 MED ORDER — LIDOCAINE HCL (PF) 1 % IJ SOLN
INTRAMUSCULAR | Status: DC | PRN
  Administered 2017-04-07: 2 mL via INTRADERMAL

## 2017-04-07 MED ORDER — HEPARIN SODIUM (PORCINE) 1000 UNIT/ML IJ SOLN
INTRAMUSCULAR | Status: AC
  Filled 2017-04-07: qty 1

## 2017-04-07 MED ORDER — HEPARIN SODIUM (PORCINE) 1000 UNIT/ML IJ SOLN
INTRAMUSCULAR | Status: DC | PRN
  Administered 2017-04-07: 7000 [IU] via INTRAVENOUS
  Administered 2017-04-07: 2000 [IU] via INTRAVENOUS
  Administered 2017-04-07: 4000 [IU] via INTRAVENOUS
  Administered 2017-04-07 (×2): 3000 [IU] via INTRAVENOUS

## 2017-04-07 MED ORDER — NITROGLYCERIN 1 MG/10 ML FOR IR/CATH LAB
INTRA_ARTERIAL | Status: AC
  Filled 2017-04-07: qty 10

## 2017-04-07 MED ORDER — LABETALOL HCL 5 MG/ML IV SOLN: 10.0000 mg | INTRAVENOUS | Status: AC | PRN

## 2017-04-07 MED ORDER — CLOPIDOGREL BISULFATE 75 MG PO TABS
ORAL_TABLET | ORAL | Status: AC
  Filled 2017-04-07: qty 1

## 2017-04-07 MED ORDER — HEPARIN (PORCINE) IN NACL 2-0.9 UNIT/ML-% IJ SOLN
INTRAMUSCULAR | Status: AC
  Filled 2017-04-07: qty 1000

## 2017-04-07 MED ORDER — AMLODIPINE BESYLATE 10 MG PO TABS
10.0000 mg | ORAL_TABLET | Freq: Every day | ORAL | Status: DC
  Administered 2017-04-07 – 2017-04-09 (×3): 10 mg via ORAL
  Filled 2017-04-07 (×3): qty 1

## 2017-04-07 MED ORDER — HYDRALAZINE HCL 20 MG/ML IJ SOLN: 5.0000 mg | INTRAMUSCULAR | Status: AC | PRN

## 2017-04-07 MED ORDER — VERAPAMIL HCL 2.5 MG/ML IV SOLN
INTRAVENOUS | Status: AC
  Filled 2017-04-07: qty 2

## 2017-04-07 MED ORDER — SODIUM CHLORIDE 0.9% FLUSH: 3.0000 mL | Freq: Two times a day (BID) | INTRAVENOUS | Status: DC

## 2017-04-07 MED ORDER — DIPHENHYDRAMINE HCL 50 MG/ML IJ SOLN
INTRAMUSCULAR | Status: DC | PRN
  Administered 2017-04-07: 12.5 mg via INTRAVENOUS

## 2017-04-07 MED ORDER — LIDOCAINE HCL (PF) 1 % IJ SOLN
INTRAMUSCULAR | Status: AC
  Filled 2017-04-07: qty 30

## 2017-04-07 MED ORDER — DIPHENHYDRAMINE HCL 50 MG/ML IJ SOLN
INTRAMUSCULAR | Status: AC
  Filled 2017-04-07: qty 1

## 2017-04-07 MED ORDER — MIDAZOLAM HCL 2 MG/2ML IJ SOLN
INTRAMUSCULAR | Status: DC | PRN
  Administered 2017-04-07 (×2): 1 mg via INTRAVENOUS

## 2017-04-07 MED ORDER — ONDANSETRON HCL 4 MG/2ML IJ SOLN: 4.0000 mg | Freq: Four times a day (QID) | INTRAMUSCULAR | Status: DC | PRN

## 2017-04-07 MED ORDER — ASPIRIN 81 MG PO CHEW
81.0000 mg | CHEWABLE_TABLET | ORAL | Status: AC
  Administered 2017-04-07: 81 mg via ORAL

## 2017-04-07 MED ORDER — MIDAZOLAM HCL 2 MG/2ML IJ SOLN
INTRAMUSCULAR | Status: AC
  Filled 2017-04-07: qty 2

## 2017-04-07 MED ORDER — ASPIRIN 81 MG PO CHEW
CHEWABLE_TABLET | ORAL | Status: AC
Start: 2017-04-07 — End: 2017-04-07
  Filled 2017-04-07: qty 1

## 2017-04-07 MED ORDER — IOPAMIDOL (ISOVUE-370) INJECTION 76%
INTRAVENOUS | Status: DC | PRN
  Administered 2017-04-07: 175 mL via INTRA_ARTERIAL

## 2017-04-07 MED ORDER — IOPAMIDOL (ISOVUE-370) INJECTION 76%
INTRAVENOUS | Status: AC
  Filled 2017-04-07: qty 125

## 2017-04-07 MED ORDER — SODIUM CHLORIDE 0.9 % IV SOLN: INTRAVENOUS | Status: AC

## 2017-04-07 MED ORDER — NITROGLYCERIN 1 MG/10 ML FOR IR/CATH LAB
INTRA_ARTERIAL | Status: DC | PRN
  Administered 2017-04-07 (×4): 200 ug via INTRACORONARY

## 2017-04-07 SURGICAL SUPPLY — 26 items
BALLN ~~LOC~~ EMERGE MR 3.25X8 (BALLOONS) ×2
BALLN ~~LOC~~ EUPHORA RX 3.0X12 (BALLOONS) ×2
BALLOON ~~LOC~~ EMERGE MR 3.25X8 (BALLOONS) ×1 IMPLANT
BALLOON ~~LOC~~ EUPHORA RX 3.0X12 (BALLOONS) ×1 IMPLANT
CATH INFINITI 5 FR JL3.5 (CATHETERS) ×2 IMPLANT
CATH LAUNCHER 6FR AL1 (CATHETERS) ×1 IMPLANT
CATH MICROGUIDE FINCRSS 150 CM (MICROCATHETER) ×1 IMPLANT
CATH OPTICROSS 40MHZ (CATHETERS) ×2 IMPLANT
CATHETER LAUNCHER 6FR AL1 (CATHETERS) ×2
DEVICE RAD COMP TR BAND LRG (VASCULAR PRODUCTS) ×2 IMPLANT
GLIDESHEATH SLEND A-KIT 6F 22G (SHEATH) ×2 IMPLANT
GUIDELINER 6F (CATHETERS) ×2 IMPLANT
GUIDEWIRE INQWIRE 1.5J.035X260 (WIRE) ×1 IMPLANT
INQWIRE 1.5J .035X260CM (WIRE) ×2
KIT ENCORE 26 ADVANTAGE (KITS) ×2 IMPLANT
KIT HEART LEFT (KITS) ×2 IMPLANT
MICROGUIDE FINECROSS 150 CM (MICROCATHETER) ×2
PACK CARDIAC CATHETERIZATION (CUSTOM PROCEDURE TRAY) ×2 IMPLANT
PAD ELECT DEFIB RADIOL ZOLL (MISCELLANEOUS) ×2 IMPLANT
SLED PULL BACK IVUS (MISCELLANEOUS) ×2 IMPLANT
STENT SIERRA 3.00 X 18 MM (Permanent Stent) ×2 IMPLANT
STENT SYNERGY DES 3X16 (Permanent Stent) ×2 IMPLANT
TRANSDUCER W/STOPCOCK (MISCELLANEOUS) ×2 IMPLANT
TUBING CIL FLEX 10 FLL-RA (TUBING) ×2 IMPLANT
WIRE ASAHI SOFT 180CM (WIRE) ×2 IMPLANT
WIRE MAILMAN 300CM (WIRE) ×2 IMPLANT

## 2017-04-07 NOTE — Progress Notes (Signed)
PROGRESS NOTE  David [name] VHQ:[id number] DOB: 04-28-1946 DOA: 03/29/2017 PCP: Eloise Levels, NP (Inactive)  HPI/Recap of past 10 hours: 71 year old male with medical history significant for hypertension, diabetes, CKD stage II, chronic systolic CHF, chronic back pain, presented to the ED with dyspnea for about 3 days.  Upon arrival of EMS patient was found to be saturating 74% on room air and was treated with BiPAP. Event repeat cardiac catheterization today.  No acute chest pain no abdominal pain no nausea no vomiting.  Assessment/Plan: Principal Problem:   Acute hypoxemic respiratory failure (HCC) Active Problems:   Hypertension   Chronic back pain   Hypertensive urgency   Diabetes mellitus ([place])   CKD (chronic kidney disease), stage II   Insulin-requiring or dependent type II diabetes mellitus ([place])   Pneumonia  #Acute hypoxemic respiratory failure Resolving From CHF acute on chronic BNP 119, Echo with normal EF and normal diastolic function CT angiogram couldn't be completed due to malfunction CT.  VQ scan negative for PE Doppler lower extremities negative for DVT Oxygen as needed PT/OT Monitor closely  #Multifocal pneumonia, ruled out Unlikely, no symptoms, not ill-appearing Currently afebrile, no leukocytosis Flu panel neg Initial chest x-ray showed possible multifocal pneumonia, repeat CXR negative Discontinued IV Rocephin and Zithromax  #Acute on chronic combined diastolic CHF  Previous echo showed EF 40-45 with global hypokinesis Echo done on this admission showed normal EF with no diastolic dysfunction Cardiology on board Treated with IV lasix 40mg  daily, currently on hold Losartan, metoprolol, bidil dose increase metoprolol and amlodipine per cardiology as well.  #AK I on CKD stage III Resolved and renal function at baseline Likely due to contrast exposure S/p 1L of IVF on 11/13 Daily BMP Monitor renal function  #CAD Continue aspirin,  Lipitor, bidil Event left cardiac cath, distal LAD PCI performed.  Staged PCI to mid LAD successfully completed per cardiology. Continue aspirin and Plavix and heparin and nitroglycerin infusion.  #Hypertension Losartan, bidil, Lopressor changed to Toprol  #Type II DM Continue Levemir, SSI  Suspect sleep apnea. Patient has increased daytime sleepiness, fatigue, morning headaches, sudden awakening from his sleep with trouble breathing even when he does not have any volume overload.  Symptoms are consistent and concerning for sleep apnea recommend outpatient sleep study.  Code Status: Full  Family Communication: Wife at bedside  Disposition Plan: Pending improvement   Consultants:  Cardiology  Procedures:  None  Antimicrobials:  None  Status post IV ceftriaxone and azithromycin, discontinued  DVT prophylaxis: Lovenox   Objective: Vitals:   04/07/17 1028 04/07/17 1033 04/07/17 1047 04/07/17 1647  BP:   (!) 174/80 (!) 155/71  Pulse: (!) 0 (!) 0  71  Resp:    14  Temp:   98.4 F (36.9 C) 98.4 F (36.9 C)  TempSrc:   Oral Oral  SpO2: (!) 0% (!) 0%  95%  Weight:      Height:        Intake/Output Summary (Last 24 hours) at 04/07/2017 1835 Last data filed at 04/07/2017 0700 Gross per 24 hour  Intake 834.25 ml  Output 500 ml  Net 334.25 ml   Filed Weights   04/05/17 0550 04/06/17 0416 04/07/17 0555  Weight: 102.8 kg (226 lb 11.2 oz) 104.1 kg (229 lb 8 oz) 104.1 kg (229 lb 8 oz)    Exam:   General: Alert, awake, oriented x3  Cardiovascular: S1-S2 present, no added heart sounds  Respiratory: Chest clear bilaterally, no added sounds  Abdomen: Soft,  nondistended, nontender  Musculoskeletal: Trace ankle edema on the right, none on the left  Skin: Normal  Psychiatry: Normal mood   Data Reviewed: CBC: Recent Labs  Lab 04/01/17 0251 04/02/17 0926 04/03/17 0603 04/06/17 0235 04/07/17 0156  WBC 5.8 6.9 7.1 6.6 8.6  NEUTROABS  --   --   --   --   5.9  HGB 8.7* 9.7* 9.1* 8.5* 8.2*  HCT 27.3* 30.4* 28.7* 26.7* 25.0*  MCV 105.0* 105.9* 105.9* 106.0* 106.4*  PLT 209 236 227 235 875   Basic Metabolic Panel: Recent Labs  Lab 04/03/17 0603 04/04/17 1116 04/05/17 0525 04/06/17 0235 04/07/17 0156  NA 139 136 137 137 136  K 4.0 3.9 4.1 4.2 3.9  CL 101 99* 99* 101 102  CO2 30 29 32 30 29  GLUCOSE 89 229* 96 266* 162*  BUN 18 20 25* 24* 21*  CREATININE 1.44* 1.41* 1.64* 1.63* 1.51*  CALCIUM 8.5* 8.5* 8.5* 7.9* 7.9*  MG  --  2.2  --  2.2 2.1   GFR: Estimated Creatinine Clearance: 59.4 mL/min (A) (by C-G formula based on SCr of 1.51 mg/dL (H)). Liver Function Tests: No results for input(s): AST, ALT, ALKPHOS, BILITOT, PROT, ALBUMIN in the last 168 hours. No results for input(s): LIPASE, AMYLASE in the last 168 hours. No results for input(s): AMMONIA in the last 168 hours. Coagulation Profile: Recent Labs  Lab 04/06/17 0543  INR 0.99   Cardiac Enzymes: Recent Labs  Lab 04/01/17 1521  TROPONINI <0.03   BNP (last 3 results) No results for input(s): PROBNP in the last 8760 hours. HbA1C: No results for input(s): HGBA1C in the last 72 hours. CBG: Recent Labs  Lab 04/06/17 1716 04/06/17 2157 04/07/17 0557 04/07/17 1120 04/07/17 1645  GLUCAP 151* 216* 163* 176* 320*   Lipid Profile: No results for input(s): CHOL, HDL, LDLCALC, TRIG, CHOLHDL, LDLDIRECT in the last 72 hours. Thyroid Function Tests: No results for input(s): TSH, T4TOTAL, FREET4, T3FREE, THYROIDAB in the last 72 hours. Anemia Panel: No results for input(s): VITAMINB12, FOLATE, FERRITIN, TIBC, IRON, RETICCTPCT in the last 72 hours. Urine analysis:    Component Value Date/Time   COLORURINE STRAW (A) 09/04/2016 1233   APPEARANCEUR CLEAR 09/04/2016 1233   LABSPEC 1.006 09/04/2016 1233   PHURINE 5.0 09/04/2016 1233   GLUCOSEU NEGATIVE 09/04/2016 1233   HGBUR SMALL (A) 09/04/2016 1233   BILIRUBINUR NEGATIVE 09/04/2016 1233   KETONESUR NEGATIVE  09/04/2016 1233   PROTEINUR 100 (A) 09/04/2016 1233   UROBILINOGEN 1.0 02/13/2015 1934   NITRITE NEGATIVE 09/04/2016 1233   LEUKOCYTESUR NEGATIVE 09/04/2016 1233   Sepsis Labs: @LABRCNTIP (procalcitonin:4,lacticidven:4)  ) Recent Results (from the past 240 hour(s))  MRSA PCR Screening     Status: None   Collection Time: 03/29/17 10:37 PM  Result Value Ref Range Status   MRSA by PCR NEGATIVE NEGATIVE Final    Comment:        The GeneXpert MRSA Assay (FDA approved for NASAL specimens only), is one component of a comprehensive MRSA colonization surveillance program. It is not intended to diagnose MRSA infection nor to guide or monitor treatment for MRSA infections.       Studies: No results found.  Scheduled Meds: . amLODipine  10 mg Oral Daily  . aspirin EC  81 mg Oral Daily  . atorvastatin  40 mg Oral q1800  . clopidogrel  75 mg Oral Daily  . docusate sodium  100 mg Oral Daily  . fluticasone  1 spray Each Nare  Daily  . gabapentin  600 mg Oral BID  . insulin aspart  0-15 Units Subcutaneous TID WC  . insulin aspart  0-5 Units Subcutaneous QHS  . insulin detemir  40 Units Subcutaneous Daily  . ipratropium-albuterol  3 mL Nebulization BID  . isosorbide-hydrALAZINE  2 tablet Oral TID  . latanoprost  1 drop Both Eyes QHS  . Living Better with Heart Failure Book   Does not apply Once  . metoprolol succinate  100 mg Oral Daily  . sodium chloride flush  3 mL Intravenous Q12H  . sodium chloride flush  3 mL Intravenous Q12H  . sodium chloride flush  3 mL Intravenous Q12H    Continuous Infusions: . sodium chloride    . sodium chloride       LOS: 9 days     Berle Mull, MD [facility]  If 7PM-7AM, please contact night-coverage [email or web address] Password [facility] 04/07/2017, 6:35 PM

## 2017-04-07 NOTE — Progress Notes (Signed)
PT Cancellation Note  Patient Details Name: David [name] MRN: [id number] DOB: 07-28-1945   Cancelled Treatment:    Reason Eval/Treat Not Completed: Other (comment).  Pt reports he is too sore to get up at this time (in his right arm).  Pt is agreeable for PT to come back later today as time allows.  He reports he is supposed to d/c home tomorrow.    Thanks,    Barbarann Ehlers. Cole Klugh, PT, DPT [phone]   04/07/2017, 3:11 PM

## 2017-04-07 NOTE — Interval H&P Note (Signed)
History and Physical Interval Note:  04/07/2017 7:24 AM  David [name]  has presented today for surgery, with the diagnosis of cad  The various methods of treatment have been discussed with the patient and family. After consideration of risks, benefits and other options for treatment, the patient has consented to  Procedure(s): CORONARY STENT INTERVENTION (N/A) as a surgical intervention .  The patient's history has been reviewed, patient examined, no change in status, stable for surgery.  I have reviewed the patient's chart and labs.  Questions were answered to the patient's satisfaction.    2016/2017 Appropriate Use Criteria for Coronary Revascularization Clinical Presentation: Diabetes Mellitus? Symptom Status? S/P CABG? Antianginal Therapy (# of long-acting drugs)? Results of Non-invasive testing? FFR/iFR results in all diseased vessels? Patient undergoing renal transplant? Patient undergoing percutaneous valve procedure (TAVR, MitraClip, Others)? Symptom Status:  Ischemic Symptoms  Non-invasive Testing:  Intermediate Risk  If no or indeterminate stress test, FFR/iFR results in all diseased vessels:  N/A  Diabetes Mellitus:  Yes  S/P CABG:  No  Antianginal therapy (number of long-acting drugs):  >=2  Patient undergoing renal transplant:  No  Patient undergoing percutaneous valve procedure:  No    newline 1 Vessel Disease PCI CABG  No proximal LAD involvement, No proximal left dominant LCX involvement A (8); Indication 2 M (6); Indication 2   Proximal left dominant LCX involvement A (8); Indication 5 A (8); Indication 5   Proximal LAD involvement A (8); Indication 5 A (8); Indication 5   newline 2 Vessel Disease  No proximal LAD involvement A (8); Indication 8 A (7); Indication 8   Proximal LAD involvement A (8); Indication 14 A (9); Indication 14   newline 3 Vessel Disease  Low disease complexity (e.g., focal stenoses, SYNTAX <=22) A (7); Indication 19 A (9); Indication 19     Intermediate or high disease complexity (e.g., SYNTAX >=23) M (6); Indication 23 A (9); Indication 23   newline Left Main Disease  Isolated LMCA disease: ostial or midshaft A (7); Indication 24 A (9); Indication 24   Isolated LMCA disease: bifurcation involvement M (6); Indication 25 A (9); Indication 25   LMCA ostial or midshaft, concurrent low disease burden multivessel disease (e.g., 1-2 additional focal stenoses, SYNTAX <=22) A (7); Indication 26 A (9); Indication 26   LMCA ostial or midshaft, concurrent intermediate or high disease burden multivessel disease (e.g., 1-2 additional bifurcation stenoses, long stenoses, SYNTAX >=23) M (4); Indication 27 A (9); Indication 27   LMCA bifurcation involvement, concurrent low disease burden multivessel disease (e.g., 1-2 additional focal stenoses, SYNTAX <=22) M (6); Indication 28 A (9); Indication 28   LMCA bifurcation involvement, concurrent intermediate or high disease burden multivessel disease (e.g., 1-2 additional bifurcation stenoses, long stenoses, SYNTAX >=23) R (3); Indication 29 A (9); Indication [place]

## 2017-04-07 NOTE — Progress Notes (Signed)
Physical Therapy Treatment Patient Details Name: David [name] MRN: [id number] DOB: 27-Aug-1945 Today's Date: 04/07/2017    History of Present Illness Pt is a 71 y.o. male with PMH significant for HTN, DM, chronic back pain, CKD II, and chronic systolic CHF who presented to the ED on 03/29/17 with dyspnea; worked up for acute on chronic systolic CHF. Pt reports that he was in his usual state of health until he noted the insidious development of worsening dyspnea. Other PMH includes DDD, R THA.  Pt s/p cath with stent on 04/07/17 (radial access).     PT Comments    Pt reluctant to mobilize with therapy, but was agreeable to gait to bathroom and OOB to chair after bathroom.  Wife explained home situation including 12 stairs with rail on his right radial side to get up to the bedroom.  He will need to practice stairs prior to d/c home and demonstrate safe ambulation with quad cane in his opposite hand than what he is used to.  PT will continue to follow acutely.    Follow Up Recommendations  Home health PT;Supervision for mobility/OOB;Other (comment)(HHPT safety eval to assess home stairs)     Equipment Recommendations  None recommended by PT(post radial access cath, not able to use RW)    Recommendations for Other Services   NA     Precautions / Restrictions Precautions Precautions: Fall;Other (comment) Precaution Comments: radial access cath precautions Restrictions RUE Weight Bearing: Non weight bearing    Mobility  Bed Mobility Overal bed mobility: Modified Independent             General bed mobility comments: uses momentum, left rail, HOB elevated  Transfers Overall transfer level: Needs assistance   Transfers: Sit to/from Stand Sit to Stand: Min guard         General transfer comment: Min guard assist for balance and multiple attempts needed to get to stand from low bed.   Ambulation/Gait Ambulation/Gait assistance: Min guard Ambulation Distance (Feet): 15  Feet Assistive device: 1 person hand held assist Gait Pattern/deviations: Step-through pattern;Staggering right;Staggering left;Trunk flexed Gait velocity: decreased Gait velocity interpretation: Below normal speed for age/gender General Gait Details: Pt with mildly staggering gait pattern, verbal cues to stand for a minute before progressing to gait for safety. Pt reaching with hands for support.           Balance Overall balance assessment: Needs assistance Sitting-balance support: Feet supported;Bilateral upper extremity supported;Single extremity supported Sitting balance-Leahy Scale: Good     Standing balance support: Single extremity supported Standing balance-Leahy Scale: Poor Standing balance comment: Reliant on UE support for dynamic balance                            Cognition Arousal/Alertness: Awake/alert Behavior During Therapy: WFL for tasks assessed/performed Overall Cognitive Status: Within Functional Limits for tasks assessed                                               Pertinent Vitals/Pain Pain Assessment: Faces Faces Pain Scale: Hurts little more Pain Location: right arm Pain Descriptors / Indicators: Aching Pain Intervention(s): Limited activity within patient's tolerance;Monitored during session;Repositioned           PT Goals (current goals can now be found in the care plan section) Acute Rehab PT Goals Patient  Stated Goal: to go home tomorrow.  Progress towards PT goals: Progressing toward goals    Frequency    Min 3X/week      PT Plan Current plan remains appropriate       AM-PAC PT "6 Clicks" Daily Activity  Outcome Measure  Difficulty turning over in bed (including adjusting bedclothes, sheets and blankets)?: A Little Difficulty moving from lying on back to sitting on the side of the bed? : A Little Difficulty sitting down on and standing up from a chair with arms (e.g., wheelchair, bedside  commode, etc,.)?: Unable Help needed moving to and from a bed to chair (including a wheelchair)?: A Little Help needed walking in hospital room?: A Little Help needed climbing 3-5 steps with a railing? : A Little 6 Click Score: 16    End of Session Equipment Utilized During Treatment: Gait belt Activity Tolerance: Patient tolerated treatment well Patient left: Other (comment)(in bathroom to have a BM) Nurse Communication: Mobility status PT Visit Diagnosis: Unsteadiness on feet (R26.81);Muscle weakness (generalized) (M62.81);Difficulty in walking, not elsewhere classified (R26.2);Repeated falls (R29.6);History of falling (Z91.81)     Time: 7096-4383 PT Time Calculation (min) (ACUTE ONLY): 31 min  Charges:  $Therapeutic Activity: 8-22 mins          [name], PT, DPT [phone]            04/07/2017, 6:00 PM

## 2017-04-07 NOTE — Progress Notes (Signed)
Subjective:  Feels well.  No chest pain Resting post PCI  Objective:  Vital Signs in the last 24 hours: Temp:  [98.4 F (36.9 C)-99 F (37.2 C)] 98.4 F (36.9 C) (11/20 1047) Pulse Rate:  [0-82] 0 (11/20 1033) Resp:  [4-72] 72 (11/20 1018) BP: (124-194)/(50-104) 174/80 (11/20 1047) SpO2:  [0 %-100 %] 0 % (11/20 1033) Weight:  [104.1 kg (229 lb 8 oz)] 104.1 kg (229 lb 8 oz) (11/20 0555)  Intake/Output from previous day: 11/19 0701 - 11/20 0700 In: 1194.3 [P.O.:600; I.V.:594.3] Out: 2825 [Urine:2825] Intake/Output from this shift: No intake/output data recorded.  Physical Exam: Nursing note and vitals reviewed. Constitutional: He is oriented to person, place, and time. He appears well-developed.  Mildly obese  HENT:  Head: Normocephalic and atraumatic.  Neck: No JVD present.  Cardiovascular: Normal rate and regular rhythm.  No murmur (I/VI LLSB and apex) heard. Respiratory: Effort normal. He has no rales.  GI: Soft. Bowel sounds are normal.  Musculoskeletal: He exhibits trace edema.  Neurological: He is alert and oriented to person, place, and time.  Skin: Skin is warm.     Lab Results: Recent Labs    04/06/17 0235 04/07/17 0156  WBC 6.6 8.6  HGB 8.5* 8.2*  PLT 235 258   Recent Labs    04/06/17 0235 04/07/17 0156  NA 137 136  K 4.2 3.9  CL 101 102  CO2 30 29  GLUCOSE 266* 162*  BUN 24* 21*  CREATININE 1.63* 1.51*    Cardiac Studies: Procedure 04/06/2017:  CORONARY STENT INTERVENTION  RIGHT/LEFT HEART CATH AND CORONARY ANGIOGRAPHY  Conclusion   Mild-to-moderate RCA and circumflex disease. Severely calcified 80-90% mid to distal LAD stenoses. Pulmonary hypertension WHO group II Successful PTCA and stent placement-distal LAD stenosis with resolute 2.75 x 26 cm stent. PTCA 90% mid LAD stenosis, unsuccessful stent placement.  Recommendations: Observe the patient in telemetry overnight. Continue IV heparin. Continue aspirin 81 mg and Plavix 75  mg. Recommend gentle hydration with 25 mL an hour fluids. Monitor urine output closely.   Procedures 04/07/2017:  CORONARY STENT INTERVENTION  Conclusion   Successful complex PTCA and overlapping stents mid LAD Complexity due to severe calcification and severe tortuosity Xience 3.0 X 18 mm Synergy 3.0 X 16 mm Residual 10% unde expansion of proximal stent  Recommendation: ASA 81 mg lifelong and plavix 75 mg for at least 1 year.  Consider adding 2.5 xarelto. Aggressive risk factor modification, especially hypertension control.     Assessment: 71 y/o AAM w/uncontrolled hypertension, type 2 DM, CKD III, admitted with episode of shortness of breath  Shortness of breath Angina equivalent  CAD s/p mid-distal LAD stagef PCI with 3 overlapping drug eluting stents Hypertension Type 2 DM Prior abnormal stress test  Recommendations: Continue ASA 81 mg, plavix 75 mg daily.  Gentle hydration Continue lipitor 40 mg Continue metoprolol succinate 100 mg daily, losartan 50 mg daily, Bidil 20-37.5 tid Increase amlodipine to 10 mg. Hold lasix today.    LOS: 9 days    David [name] David [name] 04/07/2017, 12:44 PM  [name], MD [facility] [place] Pager: [phone] Office: [phone] If no answer Cell [phone]

## 2017-04-07 NOTE — Progress Notes (Signed)
Patient complained of pain in upper forearm proximal to brachial site. No swelling noted at brachial site. Noted large hematoma area firm and painful. Alfreados Mitchell from cath lab on unit and asked for his assessment.  Alfreados agreed that Dr. Virgina Jock should be notified and no treatment until he assessed site. Dr. Virgina Jock paged and came to assess site.  Orders received for vascular ultrasound on area distal to brachial site to assess for pseudoaneurysm and bleeding. Orders put in computer and vascular tech spoke to Dr. Virgina Jock.  Ultra sound completed at 1625 and no bleeding noted per tech. Ice to area as ordered and q2 hour neuro checks. Neuro checks within normal limits, patient stated the ice decreased discomfort and site felt better. Area feeling slightly softer and less tender.  1930 Dr. Virgina Jock into see patient and assess area. Noted area was softer than earlier and neuros wnls. Reviewed plan of care with patient and wife and reviewed his assessment of site.

## 2017-04-07 NOTE — Progress Notes (Signed)
04/07/2017 I stopped back by and saw pt was up in the chair and he was agreeable to gait now.  We ambulated a good distance down the hallway with pt stabilizing on the IV pole on his left side.  Wife to bring his cane in tomorrow and PT will attempt stairs with him for safe home access to his bedroom on the second floor.  No DOE with gait and pt reported no chest discomfort now.  Barbarann Ehlers Khalil Belote, PT, DPT [phone]      04/07/17 1802  PT Visit Information  Last PT Received On 04/07/17  Assistance Needed +1  History of Present Illness Pt is a 71 y.o. male with PMH significant for HTN, DM, chronic back pain, CKD II, and chronic systolic CHF who presented to the ED on 03/29/17 with dyspnea; worked up for acute on chronic systolic CHF. Pt reports that he was in his usual state of health until he noted the insidious development of worsening dyspnea. Other PMH includes DDD, R THA.  Pt s/p cath with stent on 04/07/17 (radial access).   Subjective Data  Patient Stated Goal to go home tomorrow.   Precautions  Precautions Fall;Other (comment)  Precaution Comments radial access cath precautions  Restrictions  RUE Weight Bearing NWB  Pain Assessment  Pain Assessment Faces  Faces Pain Scale 4  Pain Location right arm  Pain Descriptors / Indicators Aching  Pain Intervention(s) Limited activity within patient's tolerance;Monitored during session;Repositioned  Cognition  Arousal/Alertness Awake/alert  Behavior During Therapy WFL for tasks assessed/performed  Overall Cognitive Status Within Functional Limits for tasks assessed  Bed Mobility  General bed mobility comments Pt is OOB in the recliner chair.    Transfers  Overall transfer level Needs assistance  Equipment used 1 person hand held assist  Transfers Sit to/from Stand  Sit to Stand Min guard  General transfer comment Min guard assist for balance, used hand rail on chair.   Ambulation/Gait  Ambulation/Gait assistance Min guard   Ambulation Distance (Feet) 150 Feet  Assistive device (hallway rail at times, IV pole)  Gait Pattern/deviations Step-through pattern;Staggering right;Staggering left;Trunk flexed  General Gait Details Mildly staggering gait pattern, used IV pole in left hand for support, flexed trunk, no DOE or chest pain.   Gait velocity decreased  Gait velocity interpretation Below normal speed for age/gender  Balance  Overall balance assessment Needs assistance  Sitting-balance support Feet supported;Bilateral upper extremity supported;Single extremity supported  Sitting balance-Leahy Scale Good  Standing balance support Single extremity supported  Standing balance-Leahy Scale Poor  Standing balance comment Reliant on UE support for dynamic balance  PT - End of Session  Equipment Utilized During Treatment Gait belt  Activity Tolerance Patient tolerated treatment well  Patient left Other (comment) (in bathroom to have a BM)  Nurse Communication Mobility status  PT - Assessment/Plan  PT Plan Current plan remains appropriate  PT Visit Diagnosis Unsteadiness on feet (R26.81);Muscle weakness (generalized) (M62.81);Difficulty in walking, not elsewhere classified (R26.2);Repeated falls (R29.6);History of falling (Z91.81)  PT Frequency (ACUTE ONLY) Min 3X/week  Follow Up Recommendations Home health PT;Supervision for mobility/OOB;Other (comment) (HHPT safety eval to assess home stairs)  PT equipment None recommended by PT (post radial access cath, not able to use RW)  AM-PAC PT "6 Clicks" Daily Activity Outcome Measure  Difficulty turning over in bed (including adjusting bedclothes, sheets and blankets)? 3  Difficulty moving from lying on back to sitting on the side of the bed?  3  Difficulty sitting down on  and standing up from a chair with arms (e.g., wheelchair, bedside commode, etc,.)? 1  Help needed moving to and from a bed to chair (including a wheelchair)? 3  Help needed walking in hospital room?  3  Help needed climbing 3-5 steps with a railing?  3  6 Click Score 16  Mobility G Code  CK  PT Goal Progression  Progress towards PT goals Progressing toward goals  PT Time Calculation  PT Start Time (ACUTE ONLY) 1735  PT Stop Time (ACUTE ONLY) 1752  PT Time Calculation (min) (ACUTE ONLY) 17 min  PT General Charges  $$ ACUTE PT VISIT 1 Visit  PT Treatments  $Gait Training 8-22 mins

## 2017-04-07 NOTE — Progress Notes (Signed)
ANTICOAGULATION CONSULT NOTE - Follow Up Consult  Pharmacy Consult for heparin Indication: CAD awaiting staged PCI  Labs: Recent Labs    04/05/17 0525 04/06/17 0235 04/06/17 0543 04/07/17 0156  HGB  --  8.5*  --  8.2*  HCT  --  26.7*  --  25.0*  PLT  --  235  --  258  LABPROT  --   --  13.0  --   INR  --   --  0.99  --   HEPARINUNFRC  --   --   --  0.19*  CREATININE 1.64* 1.63*  --  1.51*    Assessment/Plan:  71yo male subtherapeutic on heparin with initial dosing post-cath though lab drawn just a few hours after gtt started and likely needs more time to accumulate. Will continue gtt at current rate and check additional level if cath schedule delayed.  Wynona Neat, PharmD, BCPS  04/07/2017,2:58 AM

## 2017-04-07 NOTE — Progress Notes (Addendum)
RUE arterial duplex limited. Prelim: no evidence of pseudoaneurysm.

## 2017-04-08 ENCOUNTER — Inpatient Hospital Stay (HOSPITAL_COMMUNITY): Payer: Medicare Other

## 2017-04-08 DIAGNOSIS — M79609 Pain in unspecified limb: Secondary | ICD-10-CM

## 2017-04-08 DIAGNOSIS — N189 Chronic kidney disease, unspecified: Secondary | ICD-10-CM

## 2017-04-08 DIAGNOSIS — N179 Acute kidney failure, unspecified: Secondary | ICD-10-CM

## 2017-04-08 DIAGNOSIS — Z955 Presence of coronary angioplasty implant and graft: Secondary | ICD-10-CM

## 2017-04-08 DIAGNOSIS — I509 Heart failure, unspecified: Secondary | ICD-10-CM

## 2017-04-08 LAB — BASIC METABOLIC PANEL
Anion gap: 6 (ref 5–15)
BUN: 25 mg/dL — AB (ref 6–20)
CHLORIDE: 102 mmol/L (ref 101–111)
CO2: 29 mmol/L (ref 22–32)
CREATININE: 1.58 mg/dL — AB (ref 0.61–1.24)
Calcium: 8 mg/dL — ABNORMAL LOW (ref 8.9–10.3)
GFR calc Af Amer: 50 mL/min — ABNORMAL LOW (ref >60)
GFR calc non Af Amer: 43 mL/min — ABNORMAL LOW (ref >60)
GLUCOSE: 168 mg/dL — AB (ref 65–99)
POTASSIUM: 4.2 mmol/L (ref 3.5–5.1)
SODIUM: 137 mmol/L (ref 135–145)

## 2017-04-08 LAB — GLUCOSE, CAPILLARY
GLUCOSE-CAPILLARY: 287 mg/dL — AB (ref 65–99)
GLUCOSE-CAPILLARY: 123 mg/dL — AB (ref 65–99)
GLUCOSE-CAPILLARY: 207 mg/dL — AB (ref 65–99)
Glucose-Capillary: 165 mg/dL — ABNORMAL HIGH (ref 65–99)

## 2017-04-08 LAB — CBC
HCT: 23.6 % — ABNORMAL LOW (ref 39.0–52.0)
Hemoglobin: 7.5 g/dL — ABNORMAL LOW (ref 13.0–17.0)
MCH: 33.8 pg (ref 26.0–34.0)
MCHC: 31.8 g/dL (ref 30.0–36.0)
MCV: 106.3 fL — AB (ref 78.0–100.0)
PLATELETS: 234 10*3/uL (ref 150–400)
RBC: 2.22 MIL/uL — ABNORMAL LOW (ref 4.22–5.81)
RDW: 11.9 % (ref 11.5–15.5)
WBC: 8.8 10*3/uL (ref 4.0–10.5)

## 2017-04-08 MED FILL — Verapamil HCl IV Soln 2.5 MG/ML: INTRAVENOUS | Qty: 2 | Status: AC

## 2017-04-08 NOTE — Progress Notes (Signed)
Subjective:  Right forearm swelling and pain since 11/20 afternoon. Stable. No chest pain No shortness of breath  Objective:  Vital Signs in the last 24 hours: Temp:  [98.2 F (36.8 C)-99 F (37.2 C)] 98.2 F (36.8 C) (11/21 0718) Pulse Rate:  [0-81] 65 (11/21 0718) Resp:  [4-72] 17 (11/21 0542) BP: (132-177)/(58-96) 145/63 (11/21 0718) SpO2:  [0 %-100 %] 98 % (11/21 0718) Weight:  [103 kg (227 lb 1.2 oz)] 103 kg (227 lb 1.2 oz) (11/21 0542)  Intake/Output from previous day: 11/20 0701 - 11/21 0700 In: 1260 [P.O.:960; I.V.:300] Out: -  Intake/Output from this shift: No intake/output data recorded.  Physical Exam: Nursing note and vitals reviewed. Constitutional: He is oriented to person, place, and time. He appears well-developed.  Mildly obese  HENT:  Head: Normocephalic and atraumatic.  Neck: No JVD present.  Cardiovascular: Normal rate and regular rhythm.  Right forearm with diffuse swelling. No discernable hematoma. Firm and tender. Right radial site with no bleeding, hematoma. Good capillary refill. No paresthesias No murmur (I/VI LLSB and apex) heard. Respiratory: Effort normal. He has no rales.  GI: Soft. Bowel sounds are normal.  Musculoskeletal: He exhibits trace edema.  Neurological: He is alert and oriented to person, place, and time.  Skin: Skin is warm.     Lab Results: Recent Labs    04/07/17 0156 04/08/17 0525  WBC 8.6 8.8  HGB 8.2* 7.5*  PLT 258 234   Recent Labs    04/07/17 0156 04/08/17 0525  NA 136 137  K 3.9 4.2  CL 102 102  CO2 29 29  GLUCOSE 162* 168*  BUN 21* 25*  CREATININE 1.51* 1.58*    Cardiac Studies: Procedure 04/06/2017:  CORONARY STENT INTERVENTION  RIGHT/LEFT HEART CATH AND CORONARY ANGIOGRAPHY  Conclusion   Mild-to-moderate RCA and circumflex disease. Severely calcified 80-90% mid to distal LAD stenoses. Pulmonary hypertension WHO group II Successful PTCA and stent placement-distal LAD stenosis with resolute  2.75 x 26 cm stent. PTCA 90% mid LAD stenosis, unsuccessful stent placement.  Recommendations: Observe the patient in telemetry overnight. Continue IV heparin. Continue aspirin 81 mg and Plavix 75 mg. Recommend gentle hydration with 25 mL an hour fluids. Monitor urine output closely.   Procedures 04/07/2017:  CORONARY STENT INTERVENTION  Conclusion   Successful complex PTCA and overlapping stents mid LAD Complexity due to severe calcification and severe tortuosity Xience 3.0 X 18 mm Synergy 3.0 X 16 mm Residual 10% unde expansion of proximal stent  Recommendation: ASA 81 mg lifelong and plavix 75 mg for at least 1 year.  Consider adding 2.5 xarelto. Aggressive risk factor modification, especially hypertension control.     Assessment: 71 y/o AAM w/uncontrolled hypertension, type 2 DM, CKD III, admitted with episode of shortness of breath  Rt forearm swelling: Vascular ultrasound performed 11/20. Final report pending. I suspect he had some bleeding form a branch but it is stable. No s/s of compartment syndrome. Will also rule out DVT. Shortness of breath, Angina equivalent  CAD s/p mid-distal LAD staged complex PCI with 3 overlapping drug eluting stents Hypertension: Improved Type 2 DM Prior abnormal stress test  Recommendations: Continue ASA 81 mg, plavix 75 mg daily.  Continue lipitor 40 mg Continue metoprolol succinate 100 mg daily, losartan 50 mg daily, Bidil 20-37.5 tid, amlodipine to 10 mg. Monitor renal function for 1-2 more days.     LOS: 10 days    David [name] J David [name] 04/08/2017, 9:55 AM  [place], MD [facility].  [place] Pager: [phone] Office: [phone] If no answer Cell [phone]

## 2017-04-08 NOTE — Progress Notes (Signed)
Occupational Therapy Treatment Patient Details Name: David [name] MRN: [id number] DOB: [birth year] Today's Date: 04/08/2017    History of present illness 71 y.o. male presenting with insidious development of worsening dyspnea. Worked up for acute on chronic systolic CHF. s/p cath with stent 04/07/17 (radial access). PMH significant of HTN, DM, chronic back pain, CKD II, chronic systolic HF, and R THA.    OT comments  Pt able to perform toilet transfer and simulated tub transfer with min guard assist this session. Pt c/o pain in R forearm and limited ROM in RUE; provided pt with squeeze ball and encourage functional use of RUE and ROM in addition to elevation when at rest. Educated pt on energy conservation strategies and fall prevention strategies for home. D/c plan remains appropriate. Will continue to follow acutely.   Follow Up Recommendations  No OT follow up;Supervision/Assistance - 24 hour    Equipment Recommendations  3 in 1 bedside commode    Recommendations for Other Services      Precautions / Restrictions Precautions Precautions: Fall Precaution Comments: radial access cath precautions Restrictions Weight Bearing Restrictions: Yes RUE Weight Bearing: Non weight bearing       Mobility Bed Mobility               General bed mobility comments: Pt OOB in chair upon arrival  Transfers Overall transfer level: Needs assistance Equipment used: Quad cane Transfers: Sit to/from Stand Sit to Stand: Min guard         General transfer comment: Min guard for safety; posterior LOB x2 with sit to stand from chair.    Balance Overall balance assessment: Needs assistance Sitting-balance support: Feet supported;No upper extremity supported Sitting balance-Leahy Scale: Good     Standing balance support: Single extremity supported;During functional activity Standing balance-Leahy Scale: Fair                             ADL either performed or  assessed with clinical judgement   ADL Overall ADL's : Needs assistance/impaired                         Toilet Transfer: Min guard;Ambulation(quad cane)       Tub/ Shower Transfer: Min guard;Tub transfer;Ambulation(quad cane) Tub/Shower Transfer Details (indicate cue type and reason): Simulated in room Functional mobility during ADLs: Min guard;Cane General ADL Comments: Discussed energy conservation and fall prevention strategies for home.      Vision       Perception     Praxis      Cognition Arousal/Alertness: Awake/alert Behavior During Therapy: WFL for tasks assessed/performed Overall Cognitive Status: Within Functional Limits for tasks assessed                                          Exercises Exercises: Other exercises Other Exercises Other Exercises: Pt with decreased ROM RUE. Provided pt with squeeze ball and educated on elevation for edema control. Other Exercises: Encourage functional use of RUE and ROM throughout the day   Shoulder Instructions       General Comments      Pertinent Vitals/ Pain       Pain Assessment: Faces Faces Pain Scale: Hurts even more Pain Location: R forearm Pain Descriptors / Indicators: Aching Pain Intervention(s): Monitored during session;Repositioned  Home Living  Prior Functioning/Environment              Frequency  Min 2X/week        Progress Toward Goals  OT Goals(current goals can now be found in the care plan section)  Progress towards OT goals: Progressing toward goals  Acute Rehab OT Goals Patient Stated Goal: to go home OT Goal Formulation: With patient/family  Plan Discharge plan remains appropriate    Co-evaluation                 AM-PAC PT "6 Clicks" Daily Activity     Outcome Measure   Help from another person eating meals?: A Little Help from another person taking care of personal grooming?: A  Little Help from another person toileting, which includes using toliet, bedpan, or urinal?: A Little Help from another person bathing (including washing, rinsing, drying)?: A Little Help from another person to put on and taking off regular upper body clothing?: A Little Help from another person to put on and taking off regular lower body clothing?: A Little 6 Click Score: 18    End of Session Equipment Utilized During Treatment: Other (comment)(quad cane)  OT Visit Diagnosis: Other abnormalities of gait and mobility (R26.89);Pain;Muscle weakness (generalized) (M62.81) Pain - Right/Left: Right Pain - part of body: Arm   Activity Tolerance Patient tolerated treatment well   Patient Left with call bell/phone within reach;with family/visitor present;Other (comment)(in bathroom)   Nurse Communication Other (comment)(pt requesting to shower after bathroom )        Time: 1422-1445 OT Time Calculation (min): 23 min  Charges: OT General Charges $OT Visit: 1 Visit OT Treatments $Self Care/Home Management : 8-22 mins $Therapeutic Activity: 8-22 mins  Lennette Fader A. Ulice Brilliant, M.S., OTR/L Pager: [phone] 04/08/2017, 3:06 PM

## 2017-04-08 NOTE — Progress Notes (Signed)
RUE venous duplex prelim: negative for DVT and SVT. Corydon Schweiss Eunice, RDMS, RVT  

## 2017-04-08 NOTE — Progress Notes (Signed)
Physical Therapy Treatment Patient Details Name: David [name] MRN: [id number] DOB: 01/29/1946 Today's Date: 04/08/2017    History of Present Illness 71 y.o. male presenting with insidious development of worsening dyspnea. Worked up for acute on chronic systolic CHF. s/p cath with stent 04/07/17 (radial access). PMH significant of HTN, DM, chronic back pain, CKD II, chronic systolic HF, and R THA.     PT Comments    Pt progresses towards PT goals today. Pt tolerates ascending/descending stairs without use of RUE using only right stair railing, as is the setup in his home environment. Educated pt on safe stair negotiation and ambulation in home environment, however pt will benefit from home health PT follow-up to assess compliance and safe home environment. Pt continues to demonstrate decreased balance during standing and ambulation. SpO2 ranged from 88-92% during session on RA. Pt will continue to benefit from skilled PT in order to improve functional mobility and increase level of independence.     Follow Up Recommendations  Home health PT;Supervision for mobility/OOB;Other (comment)     Equipment Recommendations  None recommended by PT    Recommendations for Other Services       Precautions / Restrictions Precautions Precautions: Fall Precaution Comments: radial access cath precautions Restrictions Weight Bearing Restrictions: Yes RUE Weight Bearing: Non weight bearing    Mobility  Bed Mobility               General bed mobility comments: Pt seated EOB upon PT arrival.   Transfers Overall transfer level: Needs assistance Equipment used: Quad cane Transfers: Sit to/from Stand Sit to Stand: Min guard         General transfer comment: Min guard assist for safety and balance, left UE supported by quad cane.  Ambulation/Gait Ambulation/Gait assistance: Min guard Ambulation Distance (Feet): 200 Feet Assistive device: Quad cane Gait Pattern/deviations:  Step-through pattern;Trunk flexed Gait velocity: decreased Gait velocity interpretation: Below normal speed for age/gender General Gait Details: Pt with slightly unstable gait with instances of slight staggering left/right and challenges with maneuvering quad cane using left UE (usually usees RUE).    Stairs Stairs: Yes   Stair Management: One rail Right;Step to pattern;Sideways Number of Stairs: 7 General stair comments: Pt descends stairs sideways with back towrads rail, holding with left UE. Pt ascends stairs sideways facing rail with LUE supported on rail. pt reports silght uneasiness with stairs at first, but is able to traverse with more confidence upon initiating stair ascent and descent.   Wheelchair Mobility    Modified Rankin (Stroke Patients Only)       Balance Overall balance assessment: Needs assistance Sitting-balance support: Feet supported;No upper extremity supported Sitting balance-Leahy Scale: Good Sitting balance - Comments: Pt able to sit in chair with wheels and no arm-rests and shift weight with no UE support.    Standing balance support: Single extremity supported Standing balance-Leahy Scale: Poor Standing balance comment: Pt reliant on quad cane for standing balance. Pt reliant on wall for standing balance when without quad cane.                             Cognition Arousal/Alertness: Awake/alert Behavior During Therapy: WFL for tasks assessed/performed Overall Cognitive Status: Within Functional Limits for tasks assessed  Exercises      General Comments General comments (skin integrity, edema, etc.): Pt's SpO2 ranged from 88-92% during session on RA.       Pertinent Vitals/Pain Pain Assessment: No/denies pain    Home Living                      Prior Function            PT Goals (current goals can now be found in the care plan section) Acute Rehab PT  Goals Patient Stated Goal: to go home PT Goal Formulation: With patient/family Time For Goal Achievement: 04/13/17 Potential to Achieve Goals: Good Progress towards PT goals: Progressing toward goals    Frequency    Min 3X/week      PT Plan Current plan remains appropriate    Co-evaluation              AM-PAC PT "6 Clicks" Daily Activity  Outcome Measure  Difficulty turning over in bed (including adjusting bedclothes, sheets and blankets)?: A Little Difficulty moving from lying on back to sitting on the side of the bed? : A Little Difficulty sitting down on and standing up from a chair with arms (e.g., wheelchair, bedside commode, etc,.)?: Unable Help needed moving to and from a bed to chair (including a wheelchair)?: A Little Help needed walking in hospital room?: A Little Help needed climbing 3-5 steps with a railing? : A Little 6 Click Score: 16    End of Session Equipment Utilized During Treatment: Gait belt Activity Tolerance: Patient tolerated treatment well Patient left: in bed;with call bell/phone within reach;with family/visitor present(seated EOB) Nurse Communication: Mobility status PT Visit Diagnosis: Unsteadiness on feet (R26.81);Muscle weakness (generalized) (M62.81);Difficulty in walking, not elsewhere classified (R26.2);Repeated falls (R29.6);History of falling (Z91.81)     Time: 8453-6468 PT Time Calculation (min) (ACUTE ONLY): 20 min  Charges:  $Gait Training: 8-22 mins                    G Codes:       Judee Clara, SPT   Judee Clara 04/08/2017, 10:50 AM

## 2017-04-08 NOTE — Progress Notes (Signed)
PROGRESS NOTE  David [name] WSF:[id number] DOB: 07-07-1945 DOA: 03/29/2017 PCP: Eloise Levels, NP (Inactive)  HPI/Recap of past 77 hours: 71 year old male with medical history significant for hypertension, diabetes, CKD stage II, chronic systolic CHF, chronic back pain, presented to the ED with dyspnea for about 3 days.  Upon arrival of EMS patient was found to be saturating 74% on room air and was treated with BiPAP.  Well tolerated repeat heart cath. No CP, no SOB. Cr up a little and complaining of RUE swelling and pain. RUE duplex neg.  Assessment/Plan: Principal Problem:   Acute hypoxemic respiratory failure (HCC) Active Problems:   Hypertension   Chronic back pain   Hypertensive urgency   Diabetes mellitus ([place])   CKD (chronic kidney disease), stage II   Insulin-requiring or dependent type II diabetes mellitus ([place])   Pneumonia  #Acute hypoxemic respiratory failure -essentially resolved -associated with CAD and acute on chronic CHF> -VQ scan negative for PE -Doppler lower extremities negative for DVT -RUE duplex also neg for DVT -will follow final rec's from cardiology; but most likely home in am if he remains stable.  #Multifocal pneumonia, ruled out -Unlikely, no symptoms, not ill-appearing -has remained afebrile and with normal WBC's -Flu panel neg -repeat CXR negative -observe off abx's  #Acute on chronic combined diastolic CHF /HTN -Echo done on this admission showed normal EF with no diastolic dysfunction. -Cardiology on board -patient denies SOB. -received IV lasix for diuresis; now on hold -continue Losartan, metoprolol and bidil  -amlodipine added to better control BP as well.  #AK I on CKD stage III -Likely due to contrast exposure -resolving and very close to baseline -will monitor trend -will recommend close follow up BMET after discharge as well to follow Cr.  #CAD -Continue Lipitor, bidil -Event left cardiac cath, distal LAD PCI  performed.  -Staged PCI to mid LAD successfully completed per cardiology. -Continue aspirin and Plavix  -will follow cardiology rec's.  #Hypertension -continue losartan, bidil and toprol now -follow VS and renal function   #Type II DM -will continue SSI and levemir -patient advise to follow low carb diet.  Suspect sleep apnea. -Patient has increased daytime sleepiness, fatigue, morning headaches, sudden awakening from his sleep with trouble breathing even when he does not have any volume overload.   -Symptoms are consistent and concerning for sleep apnea, will recommend outpatient sleep study.  Code Status: Full  Family Communication: Wife at bedside  Disposition Plan: Pending improvement in right arm swelling and proven stability on renal function. Follow cardiology rec's.   Consultants:  Cardiology  Procedures:  None  Antimicrobials:  None  Status post IV ceftriaxone and azithromycin, discontinued  DVT prophylaxis: Lovenox   Objective: Vitals:   04/08/17 0542 04/08/17 0718 04/08/17 2005 04/08/17 2015  BP: 138/65 (!) 145/63  131/61  Pulse: 67 65 71 71  Resp: 17  18 19   Temp: 98.9 F (37.2 C) 98.2 F (36.8 C)  98.3 F (36.8 C)  TempSrc: Oral Oral Oral Oral  SpO2: 94% 98%  98%  Weight: 103 kg (227 lb 1.2 oz)     Height:        Intake/Output Summary (Last 24 hours) at 04/08/2017 2334 Last data filed at 04/08/2017 2034 Gross per 24 hour  Intake 720 ml  Output 400 ml  Net 320 ml   Filed Weights   04/06/17 0416 04/07/17 0555 04/08/17 0542  Weight: 104.1 kg (229 lb 8 oz) 104.1 kg (229 lb 8 oz) 103 kg (  227 lb 1.2 oz)    Exam:   General: AAOX3, no CP, no SOB. denies nausea and vomiting. Complaining of right arm pain and swelling.  Cardiovascular: RRR, no rubs, no gallops, S1 and S2  Respiratory: good air movement, no wheezing, no crackles.  Abdomen: soft, NT, ND, positive BS  Musculoskeletal: trace to 1+ edema bilaterally, no cyanosis  Skin:  right forearm with swelling and tenderness, no erythema or bruises.  Psychiatry: Normal mood   Data Reviewed: CBC: Recent Labs  Lab 04/02/17 0926 04/03/17 0603 04/06/17 0235 04/07/17 0156 04/08/17 0525  WBC 6.9 7.1 6.6 8.6 8.8  NEUTROABS  --   --   --  5.9  --   HGB 9.7* 9.1* 8.5* 8.2* 7.5*  HCT 30.4* 28.7* 26.7* 25.0* 23.6*  MCV 105.9* 105.9* 106.0* 106.4* 106.3*  PLT 236 227 235 258 681   Basic Metabolic Panel: Recent Labs  Lab 04/04/17 1116 04/05/17 0525 04/06/17 0235 04/07/17 0156 04/08/17 0525  NA 136 137 137 136 137  K 3.9 4.1 4.2 3.9 4.2  CL 99* 99* 101 102 102  CO2 29 32 30 29 29   GLUCOSE 229* 96 266* 162* 168*  BUN 20 25* 24* 21* 25*  CREATININE 1.41* 1.64* 1.63* 1.51* 1.58*  CALCIUM 8.5* 8.5* 7.9* 7.9* 8.0*  MG 2.2  --  2.2 2.1  --    GFR: Estimated Creatinine Clearance: 56.5 mL/min (A) (by C-G formula based on SCr of 1.58 mg/dL (H)).   Coagulation Profile: Recent Labs  Lab 04/06/17 0543  INR 0.99   CBG: Recent Labs  Lab 04/07/17 2203 04/08/17 0552 04/08/17 1308 04/08/17 1739 04/08/17 2124  GLUCAP 166* 165* 287* 123* 207*   Urine analysis:    Component Value Date/Time   COLORURINE STRAW (A) 09/04/2016 1233   APPEARANCEUR CLEAR 09/04/2016 1233   LABSPEC 1.006 09/04/2016 1233   PHURINE 5.0 09/04/2016 1233   GLUCOSEU NEGATIVE 09/04/2016 1233   HGBUR SMALL (A) 09/04/2016 1233   BILIRUBINUR NEGATIVE 09/04/2016 1233   KETONESUR NEGATIVE 09/04/2016 1233   PROTEINUR 100 (A) 09/04/2016 1233   UROBILINOGEN 1.0 02/13/2015 1934   NITRITE NEGATIVE 09/04/2016 1233   LEUKOCYTESUR NEGATIVE 09/04/2016 1233    Studies: No results found.  Scheduled Meds: . amLODipine  10 mg Oral Daily  . aspirin EC  81 mg Oral Daily  . atorvastatin  40 mg Oral q1800  . clopidogrel  75 mg Oral Daily  . docusate sodium  100 mg Oral Daily  . fluticasone  1 spray Each Nare Daily  . gabapentin  600 mg Oral BID  . insulin aspart  0-15 Units Subcutaneous TID WC    . insulin aspart  0-5 Units Subcutaneous QHS  . insulin detemir  40 Units Subcutaneous Daily  . ipratropium-albuterol  3 mL Nebulization BID  . isosorbide-hydrALAZINE  2 tablet Oral TID  . latanoprost  1 drop Both Eyes QHS  . Living Better with Heart Failure Book   Does not apply Once  . metoprolol succinate  100 mg Oral Daily  . sodium chloride flush  3 mL Intravenous Q12H  . sodium chloride flush  3 mL Intravenous Q12H  . sodium chloride flush  3 mL Intravenous Q12H    Continuous Infusions: . sodium chloride    . sodium chloride       LOS: 10 days     Barton Dubois, MD [facility] [phone]  If 7PM-7AM, please contact night-coverage [email or web address] Password [facility] 04/08/2017, 11:34 PM

## 2017-04-08 NOTE — Progress Notes (Signed)
[phone] PT to return when pt has cane to walk so I did not walk with pt. Education completed with pt who voiced understanding. Wife came in at end of ed and brief review done. Discussed CRP 2 and will refer to [place]. Reviewed importance of plavix with stents, NTG use and gave low sodium, heart healthy and diabetic diets. Encouraged pt to check sugars and to watch carbs.  He stated he would read handouts at home. Did not give ex ed due to limitation in mobility. Will refer to PT for home ex. Graylon Good RN BSN 04/08/2017 9:34 AM

## 2017-04-09 DIAGNOSIS — N179 Acute kidney failure, unspecified: Secondary | ICD-10-CM

## 2017-04-09 DIAGNOSIS — I509 Heart failure, unspecified: Secondary | ICD-10-CM

## 2017-04-09 DIAGNOSIS — N183 Chronic kidney disease, stage 3 unspecified: Secondary | ICD-10-CM

## 2017-04-09 DIAGNOSIS — Z955 Presence of coronary angioplasty implant and graft: Secondary | ICD-10-CM

## 2017-04-09 LAB — BASIC METABOLIC PANEL
Anion gap: 7 (ref 5–15)
BUN: 25 mg/dL — AB (ref 6–20)
CALCIUM: 8.2 mg/dL — AB (ref 8.9–10.3)
CHLORIDE: 103 mmol/L (ref 101–111)
CO2: 28 mmol/L (ref 22–32)
CREATININE: 1.63 mg/dL — AB (ref 0.61–1.24)
GFR calc non Af Amer: 41 mL/min — ABNORMAL LOW (ref >60)
GFR, EST AFRICAN AMERICAN: 48 mL/min — AB (ref >60)
Glucose, Bld: 120 mg/dL — ABNORMAL HIGH (ref 65–99)
Potassium: 4.4 mmol/L (ref 3.5–5.1)
SODIUM: 138 mmol/L (ref 135–145)

## 2017-04-09 LAB — GLUCOSE, CAPILLARY
GLUCOSE-CAPILLARY: 67 mg/dL (ref 65–99)
Glucose-Capillary: 120 mg/dL — ABNORMAL HIGH (ref 65–99)

## 2017-04-09 LAB — IRON AND TIBC
IRON: 34 ug/dL — AB (ref 45–182)
Saturation Ratios: 15 % — ABNORMAL LOW (ref 17.9–39.5)
TIBC: 230 ug/dL — AB (ref 250–450)
UIBC: 196 ug/dL

## 2017-04-09 LAB — HEMOGLOBIN AND HEMATOCRIT, BLOOD
HEMATOCRIT: 22.2 % — AB (ref 39.0–52.0)
Hemoglobin: 7.2 g/dL — ABNORMAL LOW (ref 13.0–17.0)

## 2017-04-09 MED ORDER — POLYSACCHARIDE IRON COMPLEX 150 MG PO CAPS
150.0000 mg | ORAL_CAPSULE | Freq: Every day | ORAL | 1 refills | Status: DC
Start: 2017-04-09 — End: 2017-12-07

## 2017-04-09 MED ORDER — AMLODIPINE BESYLATE 5 MG PO TABS: 5.0000 mg | ORAL_TABLET | Freq: Every day | ORAL | 1 refills | Status: DC

## 2017-04-09 MED ORDER — INSULIN DETEMIR 100 UNIT/ML FLEXPEN: 40.0000 [IU] | PEN_INJECTOR | Freq: Every day | SUBCUTANEOUS | 1 refills | Status: DC

## 2017-04-09 MED ORDER — ALBUTEROL SULFATE HFA 108 (90 BASE) MCG/ACT IN AERS: 2.0000 | INHALATION_SPRAY | Freq: Four times a day (QID) | RESPIRATORY_TRACT | 2 refills | Status: DC | PRN

## 2017-04-09 MED ORDER — METOPROLOL SUCCINATE ER 100 MG PO TB24: 100.0000 mg | ORAL_TABLET | Freq: Every day | ORAL | 1 refills | Status: DC

## 2017-04-09 MED ORDER — FLUTICASONE PROPIONATE 50 MCG/ACT NA SUSP: 1.0000 | Freq: Every day | NASAL | 1 refills | Status: DC

## 2017-04-09 MED ORDER — FUROSEMIDE 20 MG PO TABS: 20.0000 mg | ORAL_TABLET | Freq: Every day | ORAL | 1 refills | Status: DC

## 2017-04-09 MED ORDER — CLOPIDOGREL BISULFATE 75 MG PO TABS: 75.0000 mg | ORAL_TABLET | Freq: Every day | ORAL | 1 refills | Status: DC

## 2017-04-09 NOTE — Progress Notes (Addendum)
Subjective:  Feels better No chest pain breathing stable Rt forearm pain improved  Cr increased to 1.63, about baseline. BUN flat 25 H/H dropped on 11/21 to 7.5. No melena  Objective:  Vital Signs in the last 24 hours: Temp:  [98.3 F (36.8 C)-99 F (37.2 C)] 99 F (37.2 C) (11/22 0723) Pulse Rate:  [64-71] 64 (11/22 0723) Resp:  [12-21] 21 (11/22 0723) BP: (126-161)/(48-67) 126/48 (11/22 0723) SpO2:  [93 %-98 %] 93 % (11/22 0723) Weight:  [102.1 kg (225 lb)] 102.1 kg (225 lb) (11/22 0549)  Intake/Output from previous day: 11/21 0701 - 11/22 0700 In: 1440 [P.O.:1440] Out: 800 [Urine:800] Intake/Output from this shift: No intake/output data recorded.  Physical Exam: Nursing noteand vitalsreviewed. Constitutional: He isoriented to person, place, and time. He appearswell-developed. Mildly obese HENT:  Head:Normocephalicand atraumatic.  Neck:No JVDpresent.  Cardiovascular:Normal rateand regular rhythm. Right forearm with diffuse swelling. No discernable hematoma. Softer than yesterday. Right radial site with no bleeding, hematoma. Good capillary refill. No paresthesias No murmur (I/VI LLSB and apex)heard. Respiratory:Effort normal. He hasno rales.  CL:EXNT.Bowel sounds are normal.  Musculoskeletal: He exhibits no edema.  Neurological: He isalertand oriented to person, place, and time.  Skin: Skin iswarm.  RUE [place]: No formal report available, but no hematoma per tech ? DVT. Need final report.   Lab Results: Recent Labs    04/07/17 0156 04/08/17 0525  WBC 8.6 8.8  HGB 8.2* 7.5*  PLT 258 234   Recent Labs    04/08/17 0525 04/09/17 0647  NA 137 138  K 4.2 4.4  CL 102 103  CO2 29 28  GLUCOSE 168* 120*  BUN 25* 25*  CREATININE 1.58* 1.63*   Cardiac Studies: Procedure 04/06/2017:  CORONARY STENT INTERVENTION  RIGHT/LEFT HEART CATH AND CORONARY ANGIOGRAPHY  Conclusion   Mild-to-moderate RCA and circumflex disease. Severely  calcified 80-90% mid to distal LAD stenoses. Pulmonary hypertension WHO group II Successful PTCA and stent placement-distal LAD stenosis with resolute 2.75 x 26 cm stent. PTCA 90% mid LAD stenosis, unsuccessful stent placement.  Recommendations: Observe the patient in telemetry overnight. Continue IV heparin. Continue aspirin 81 mg and Plavix 75 mg. Recommend gentle hydration with 25 mL an hour fluids. Monitor urine output closely.   Procedures 04/07/2017:  CORONARY STENT INTERVENTION  Conclusion   Successful complex PTCA and overlapping stents mid LAD Complexity due to severe calcification and severe tortuosity Xience 3.0 X 18 mm Synergy 3.0 X 16 mm Residual 10% unde expansion of proximal stent  Recommendation: ASA 81 mg lifelong and plavix 75 mg for at least 1 year.  Consider adding 2.5 xarelto. Aggressive risk factor modification, especially hypertension control.    Assessment: 71 y/o AAM w/uncontrolled hypertension, type 2 DM, CKD III, admitted with episode of shortness of breath  Rt forearm swelling: Vascular ultrasound performed 11/20. Final report pending. I suspect he had some bleeding form a branch but it is stable. No s/s of compartment syndrome. Also need to rule out DVT. Anemia: Hb baseline around 10-11. 7.5 on 11/21. Repeat H/H. No melena. Suspect this is related to blood loss during procedure and possible blood loss in Rt forearm. Checking iron studies today. Per primary team, plan to discharge today. Will arrange H/H and BMP on 11/26 and clinic f/u on 11/30 Shortness of breath, Angina equivalent  CAD s/p mid-distal LAD staged complex PCI with 3 overlapping drug eluting stents Hypertension: Much improved Type 2 DM Prior abnormal stress test  Recommendations: Continue ASA 81 mg, plavix 75 mg  daily.  Continue lipitor 40 mg Continue metoprolol succinate 100 mg daily, losartan 50 mg daily, Bidil 20-37.5 tid, amlodipine to 10 mg.  Given >30 min radiation  exposure during his cath procedure, I have educated the patient about looking for signs of radiation burns/injury on his back.  We will check for this during his upcoming follow up visits in the office on 04/17/2017    LOS: 11 days    [employer] [facility] J Shiesha Jahn 04/09/2017, 10:08 AM  Nigel Mormon, MD [facility] [place] Pager: [phone] Office: [phone] If no answer Cell [phone]

## 2017-04-09 NOTE — Discharge Summary (Signed)
Physician Discharge Summary  David [name] TDV:[id number] DOB: [birth year] DOA: 03/29/2017  PCP: Eloise Levels, NP (Inactive)  Admit date: 03/29/2017 Discharge date: 04/09/2017  Time spent: 35 minutes  Recommendations for Outpatient Follow-up:  1. Repeat BMET to follow electrolytes and renal function  2. Close follow up of CBG's and A1C; adjust hypoglycemic regimen as needed 3. Repeat CBC to follow Hgb trend    Discharge Diagnoses:  Principal Problem:   Acute hypoxemic respiratory failure (HCC) Active Problems:   Hypertension   Chronic back pain   Hypertensive urgency   Diabetes mellitus ([place])   CKD (chronic kidney disease), stage II   Insulin-requiring or dependent type II diabetes mellitus ([place])   Pneumonia   Acute on chronic congestive heart failure (HCC)   Status post coronary artery stent placement   Acute renal failure superimposed on stage 3 chronic kidney disease ([place])   Discharge Condition: stable and improved. Discharge home with instructions to follow up with PCP and with cardiology as an outpatient.  Diet recommendation: heart healthy and modified carbohydrates diet.  Filed Weights   04/07/17 0555 04/08/17 0542 04/09/17 0549  Weight: 104.1 kg (229 lb 8 oz) 103 kg (227 lb 1.2 oz) 102.1 kg (225 lb)    History of present illness:  71 year old male with medical history significant for hypertension, diabetes, CKD stage II, chronic systolic CHF, chronic back pain, presented to the ED with dyspnea for about 3 days.  Upon arrival of EMS patient was found to be saturating 74% on room air and was treated with BiPAP.  Hospital Course:  #Acute hypoxemic respiratory failure -essentially resolved -associated with CAD and acute on chronic CHF -VQ scan negative for PE -Doppler lower extremities negative for DVT -RUE duplex also neg for DVT -will follow final rec's from cardiology; discharge on low dose lasix on daily basis. -cardiac rehab anticipated as an  outpatient.  #Multifocal pneumonia, ruled out -Unlikely, no symptoms, not ill-appearing -has remained afebrile and with normal WBC's -Flu panel neg -repeat CXR negative -observe off abx's  #Acute on chronic combined diastolic CHF /HTN -Echo done on this admission showed normal EF without diastolic dysfunction. -Cardiology on board and will follow him as an outpatient  -patient denies SOB at discharge. -received IV lasix for diuresis; now discharge on 20mg  daily for volume maintenance. -continue Losartan, metoprolol and bidil   #AKI on CKD stage III -Likely due to contrast exposure and aggressive diuresis -resolving and back to baseline at discharge -will recommend close follow up BMET to follow Cr trend. -discharge on lasix 20 mg daily  #CAD -Continue Lipitor, bidil and B-blocker  -Event left cardiac cath, Staged PCI to mid and distal LAD successfully completed per cardiology. -Continue aspirin and Plavix for 12 months -will follow with cardiology next week.  #Hypertension -continue losartan, bidil, amlodipine and toprol now -follow heart healthy diet   #Type II DM -will continue Lantus at adjusted dose -patient instructed to follow low carb diet -will need close follow up with PCP/endocrinologist to further adjust hypoglycemic regimen base on his CBG's fluctuation.  Suspected sleep apnea. -Patient has increased daytime sleepiness, fatigue, morning headaches, sudden awakening from his sleep with trouble breathing even when he does not have any volume overload.   -Symptoms are consistent and concerning for sleep apnea, will recommend outpatient sleep study.  Anemia: multifactorial due to CKD, repeated labwork, mild blood loss with catheterization and hemodilution . -Hgb stable at discharge -no signs of acute bleeding seen -discharge on niferex daily -patient back  on lasix (which would control extra volume) -will recommend CBC to follow Hgb trend at follow  up.   Procedures:  2-D echo: Preserved ejection fraction, normal wall motion abnormalities  Heart cath: with LAD PCI  Consultations:  Cardiology  Discharge Exam: Vitals:   04/09/17 0549 04/09/17 0723  BP: (!) 161/67 (!) 126/48  Pulse: 71 64  Resp: 12 (!) 21  Temp: 98.7 F (37.1 C) 99 F (37.2 C)  SpO2: 98% 93%    General: AAOX3, no CP, no SOB. denies nausea and vomiting. Complaining of right arm pain and swelling.  Cardiovascular: RRR, no rubs, no gallops, S1 and S2  Respiratory: good air movement, no wheezing, no crackles.  Abdomen: soft, NT, ND, positive BS  Musculoskeletal: trace to 1+ edema bilaterally, no cyanosis  Skin: right forearm with swelling and tenderness, no erythema or bruises.  Psychiatry: Normal mood  Discharge Instructions  Discharge Instructions    (HEART FAILURE PATIENTS) Call MD:  Anytime you have any of the following symptoms: 1) 3 pound weight gain in 24 hours or 5 pounds in 1 week 2) shortness of breath, with or without a dry hacking cough 3) swelling in the hands, feet or stomach 4) if you have to sleep on extra pillows at night in order to breathe.   Complete by:  As directed    AMB Referral to Cardiac Rehabilitation - Phase II   Complete by:  As directed    Diagnosis:  Coronary Stents   AMB Referral to Cardiac Rehabilitation - Phase II   Complete by:  As directed    Diagnosis:  Coronary Stents   Amb Referral to Cardiac Rehabilitation   Complete by:  As directed    Diagnosis:  Coronary Stents   Ambulatory referral to Physical Therapy   Complete by:  As directed    Evaluation and treatment- pt initially presented for hypoxia   Diet - low sodium heart healthy   Complete by:  As directed    Diet - low sodium heart healthy   Complete by:  As directed    Diet Carb Modified   Complete by:  As directed    Discharge instructions   Complete by:  As directed    It is important that you read following instructions as well as go over your  medication list with RN to help you understand your care after this hospitalization.  Discharge Instructions: Please follow-up with PCP in one week  Please request your primary care physician to go over all Hospital Tests and Procedure/Radiological results at the follow up,  Please get all Hospital records sent to your PCP by signing hospital release before you go home.   Do not take more than prescribed Pain, Sleep and Anxiety Medications. You were cared for by a hospitalist during your hospital stay. If you have any questions about your discharge medications or the care you received while you were in the hospital after you are discharged, you can call the unit and ask to speak with the hospitalist on call if the hospitalist that took care of you is not available.  Once you are discharged, your primary care physician will handle any further medical issues. Please note that NO REFILLS for any discharge medications will be authorized once you are discharged, as it is imperative that you return to your primary care physician (or establish a relationship with a primary care physician if you do not have one) for your aftercare needs so that they can reassess your  need for medications and monitor your lab values. You Must read complete instructions/literature along with all the possible adverse reactions/side effects for all the Medicines you take and that have been prescribed to you. Take any new Medicines after you have completely understood and accept all the possible adverse reactions/side effects. Wear Seat belts while driving. If you have smoked or chewed Tobacco in the last 2 yrs please stop smoking and/or stop any Recreational drug use.   Discharge instructions   Complete by:  As directed    Take medications as prescribed Maintain adequate hydration Follow Heart healthy diet and low carbohydrates (less than 2 gram sodium daily) Follow up with PCP in 2 weeks and with cardiology service as  instructed (04/13/17)  Increase activity gradually but steadily as tolerated Check weight on daily basis   Increase activity slowly   Complete by:  As directed    Increase activity slowly   Complete by:  As directed      Current Discharge Medication List    START taking these medications   Details  albuterol (PROVENTIL HFA;VENTOLIN HFA) 108 (90 Base) MCG/ACT inhaler Inhale 2 puffs every 6 (six) hours as needed into the lungs for wheezing or shortness of breath. Qty: 1 Inhaler, Refills: 2    amLODipine (NORVASC) 5 MG tablet Take 1 tablet (5 mg total) by mouth daily. Qty: 30 tablet, Refills: 1    clopidogrel (PLAVIX) 75 MG tablet Take 1 tablet (75 mg total) by mouth daily. Qty: 30 tablet, Refills: 1    fluticasone (FLONASE) 50 MCG/ACT nasal spray Place 1 spray daily into both nostrils. Qty: 16 g, Refills: 0    iron polysaccharides (NIFEREX) 150 MG capsule Take 1 capsule (150 mg total) by mouth daily. Qty: 30 capsule, Refills: 1    metoprolol succinate (TOPROL-XL) 100 MG 24 hr tablet Take 1 tablet (100 mg total) by mouth daily. Take with or immediately following a meal. Qty: 30 tablet, Refills: 1      CONTINUE these medications which have CHANGED   Details  furosemide (LASIX) 20 MG tablet Take 1 tablet (20 mg total) by mouth daily. Take twice daily for 5 days, and then daily after that Qty: 30 tablet, Refills: 1    Insulin Detemir (LEVEMIR FLEXTOUCH) 100 UNIT/ML Pen Inject 40 Units into the skin daily. Qty: 3 mL, Refills: 1      CONTINUE these medications which have NOT CHANGED   Details  ALPRAZolam (XANAX) 1 MG tablet TAKE 1 TABLET BY MOUTH UP TO TWICE DAILY AS NEEDED FOR ANXEITY Refills: 2    aspirin EC 81 MG tablet Take 81 mg by mouth daily.    atorvastatin (LIPITOR) 40 MG tablet Take 40 mg by mouth at bedtime. Refills: 11    docusate sodium (COLACE) 100 MG capsule Take 100 mg by mouth daily.    gabapentin (NEURONTIN) 600 MG tablet Take 600 mg by mouth 2 (two)  times daily.     HYDROcodone-acetaminophen (NORCO) 10-325 MG tablet Take 1 tablet by mouth 2 (two) times daily as needed for moderate pain.  Refills: 0    isosorbide-hydrALAZINE (BIDIL) 20-37.5 MG tablet Take 1 tablet by mouth 3 (three) times daily. Qty: 90 tablet, Refills: 0    LUMIGAN 0.01 % SOLN Place 1 drop into both eyes at bedtime. Refills: 99    methocarbamol (ROBAXIN) 750 MG tablet Take 1 tablet (750 mg total) by mouth every 6 (six) hours as needed for muscle spasms. Qty: 30 tablet, Refills: 0  STOP taking these medications     metoprolol tartrate (LOPRESSOR) 25 MG tablet        Allergies  Allergen Reactions  . No Known Allergies    Follow-up Information    [facility].   Specialty:  Rehabilitation Why:  Office to call you within 2-3 business days.  If office hasn't called you in 1 week, please call number above. Contact information: [address] [place] [address] [phone]       Eloise Levels, NP. Schedule an appointment as soon as possible for a visit in 1 week(s).   Specialty:  Nurse Practitioner Contact information: [phone] [place] [address] [phone]        Adrian Prows, MD. Go on 04/17/2017.   Specialty:  Cardiology Why:  2:30 PM CBC, BMP check 11/26 Contact information: [address] [place] [address] [phone]           The results of significant diagnostics from this hospitalization (including imaging, microbiology, ancillary and laboratory) are listed below for reference.    Significant Diagnostic Studies: Dg Chest 2 View  Result Date: 03/30/2017 CLINICAL DATA:  O current chest complaints. History of CHF, diabetes, hypertension. Also history of respiratory failure. EXAM: CHEST  2 VIEW COMPARISON:  Chest x-ray of March 29, 2017 FINDINGS: The lungs are adequately inflated. The interstitial markings  are less prominent today. The pulmonary vascularity is less engorged and more distinct. The heart is top-normal in size. There is calcification in the wall of the aortic arch. There is no pleural effusion. The bony thorax exhibits no acute abnormality. IMPRESSION: Findings compatible with improving CHF. There is no alveolar pneumonia. Thoracic aortic atherosclerosis. Electronically Signed   By: David  Martinique M.D.   On: 03/30/2017 15:55   Ct Angio Chest Pe W Or Wo Contrast  Result Date: 03/29/2017 CLINICAL DATA:  Shortness of breath.  Pneumonia. EXAM: CT ANGIOGRAPHY CHEST WITH CONTRAST TECHNIQUE: Multidetector CT imaging of the chest was performed using the standard protocol during bolus administration of intravenous contrast. Multiplanar CT image reconstructions and MIPs were obtained to evaluate the vascular anatomy. CONTRAST:  125mL ISOVUE-370 IOPAMIDOL (ISOVUE-370) INJECTION 76% COMPARISON:  None. FINDINGS: Patient was given 100 mL Isovue-300 IV, although when scanner was triggered by contrast bolus, no images were produced due to scanner malfunction. A second attempt was made with 75 mL Isovue-300 IV after scanner rebooted. The same malfunction subsequently occurred as known images were obtained. Patient was returned to their room as patient's physician was notified by the [facility] technologist. Patient's physician states V/Q scan has been ordered and will address patient hydration. IMPRESSION: As above. Electronically Signed   By: Marin Olp M.D.   On: 03/29/2017 21:43   Nm Pulmonary Perf And Vent  Result Date: 03/30/2017 CLINICAL DATA:  Shortness of breath and chest pain beginning abruptly 03/28/2017. EXAM: NUCLEAR MEDICINE VENTILATION - PERFUSION LUNG SCAN TECHNIQUE: Ventilation images were obtained in multiple projections using inhaled aerosol Tc-57m DTPA. Perfusion images were obtained in multiple projections after intravenous injection of Tc-67m MAA. RADIOPHARMACEUTICALS:  32.8 mCi Technetium-36m  DTPA aerosol inhalation and 4.35 mCi Technetium-76m MAA IV COMPARISON:  Chest radiography 03/29/2017. CT angiography 03/29/2017. FINDINGS: Ventilation: No ventilation defects or air trapping. Perfusion: No perfusion defects. IMPRESSION: Normal VQ scan.  No pulmonary emboli. Electronically Signed   By: Nelson Chimes M.D.   On: 03/30/2017 16:03   Dg Chest Port 1 View  Result Date:  03/29/2017 CLINICAL DATA:  Short of breath EXAM: PORTABLE CHEST 1 VIEW COMPARISON:  09/08/2016 FINDINGS: Mild cardiomegaly. Multifocal airspace disease in the lower lobes and left mid lung. No pleural effusion. No pneumothorax. IMPRESSION: 1. Multifocal airspace disease in the bilateral lung bases and left mid lung suspicious for multifocal pneumonia. 2. Mild cardiomegaly Electronically Signed   By: Donavan Foil M.D.   On: 03/29/2017 01:25   Microbiology: No results found for this or any previous visit (from the past 240 hour(s)).   Labs: Basic Metabolic Panel: Recent Labs  Lab 04/04/17 1116 04/05/17 0525 04/06/17 0235 04/07/17 0156 04/08/17 0525 04/09/17 0647  NA 136 137 137 136 137 138  K 3.9 4.1 4.2 3.9 4.2 4.4  CL 99* 99* 101 102 102 103  CO2 29 32 30 29 29 28   GLUCOSE 229* 96 266* 162* 168* 120*  BUN 20 25* 24* 21* 25* 25*  CREATININE 1.41* 1.64* 1.63* 1.51* 1.58* 1.63*  CALCIUM 8.5* 8.5* 7.9* 7.9* 8.0* 8.2*  MG 2.2  --  2.2 2.1  --   --    CBC: Recent Labs  Lab 04/03/17 0603 04/06/17 0235 04/07/17 0156 04/08/17 0525 04/09/17 1045  WBC 7.1 6.6 8.6 8.8  --   NEUTROABS  --   --  5.9  --   --   HGB 9.1* 8.5* 8.2* 7.5* 7.2*  HCT 28.7* 26.7* 25.0* 23.6* 22.2*  MCV 105.9* 106.0* 106.4* 106.3*  --   PLT 227 235 258 234  --     BNP (last 3 results) Recent Labs    09/03/16 1802 03/29/17 0102  BNP 158.8* 119.9*    CBG: Recent Labs  Lab 04/08/17 1308 04/08/17 1739 04/08/17 2124 04/09/17 0559 04/09/17 0638  GLUCAP 287* 123* 207* 67 120*     Signed: Barton Dubois MD.  [facility] 04/09/2017, 1:07 PM

## 2017-04-13 ENCOUNTER — Telehealth (HOSPITAL_COMMUNITY): Payer: Self-pay

## 2017-04-13 NOTE — Telephone Encounter (Signed)
Patients insurance is active and benefits verified through [facility] - $20 co-pay, no deductible, out of pocket amount of $4,000/$2,479.70 has been met, no co-insurance, and no pre-authorization is required. Passport/reference [id number]  Patient will be contacted and scheduled.

## 2017-04-22 ENCOUNTER — Telehealth (HOSPITAL_COMMUNITY): Payer: Self-pay

## 2017-04-22 NOTE — Telephone Encounter (Signed)
Attempted to call patient in regards to Cardiac Rehab - Lm on Vm °

## 2017-04-29 ENCOUNTER — Telehealth (HOSPITAL_COMMUNITY): Payer: Self-pay

## 2017-04-29 ENCOUNTER — Encounter (HOSPITAL_COMMUNITY): Payer: Self-pay

## 2017-04-29 NOTE — Telephone Encounter (Signed)
2nd attempt to call patient in regards to Cardiac Rehab - Lm on Vm. Sending letter. °

## 2017-05-06 ENCOUNTER — Telehealth (HOSPITAL_COMMUNITY): Payer: Self-pay

## 2017-05-06 NOTE — Telephone Encounter (Signed)
3rd attempt to call patient in regards to Cardiac Rehab - Lm on Vm. °

## 2017-05-07 ENCOUNTER — Encounter (HOSPITAL_COMMUNITY): Admission: RE | Payer: Self-pay | Source: Ambulatory Visit

## 2017-05-07 ENCOUNTER — Ambulatory Visit (HOSPITAL_COMMUNITY): Admission: RE | Admit: 2017-05-07 | Payer: Medicare Other | Source: Ambulatory Visit | Admitting: Neurosurgery

## 2017-05-07 SURGERY — LUMBAR LAMINECTOMY/DECOMPRESSION MICRODISCECTOMY 2 LEVELS
Anesthesia: General | Laterality: Bilateral

## 2017-05-14 ENCOUNTER — Telehealth (HOSPITAL_COMMUNITY): Payer: Self-pay

## 2017-05-14 NOTE — Telephone Encounter (Signed)
Patient has not returned phone calls or responded to letter - Closed referral. °

## 2017-05-22 ENCOUNTER — Telehealth (HOSPITAL_COMMUNITY): Payer: Self-pay

## 2017-05-22 NOTE — Telephone Encounter (Signed)
Patient returned call in regards to Cardiac Rehab and is interested in the program. Scheduled orientation on 06/30/2017 at 1:30pm. Patient will attend the 1:15pm exc class.

## 2017-06-10 ENCOUNTER — Ambulatory Visit (INDEPENDENT_AMBULATORY_CARE_PROVIDER_SITE_OTHER): Payer: Medicare Other | Admitting: Family

## 2017-06-10 ENCOUNTER — Encounter (INDEPENDENT_AMBULATORY_CARE_PROVIDER_SITE_OTHER): Payer: Self-pay | Admitting: Family

## 2017-06-10 VITALS — Ht 74.0 in | Wt 225.0 lb

## 2017-06-10 DIAGNOSIS — B351 Tinea unguium: Secondary | ICD-10-CM | POA: Diagnosis not present

## 2017-06-10 DIAGNOSIS — M6701 Short Achilles tendon (acquired), right ankle: Secondary | ICD-10-CM | POA: Diagnosis not present

## 2017-06-10 DIAGNOSIS — E1142 Type 2 diabetes mellitus with diabetic polyneuropathy: Secondary | ICD-10-CM | POA: Diagnosis not present

## 2017-06-10 NOTE — Progress Notes (Signed)
Office Visit Note   Patient: David [name]           Date of Birth: 08-15-1945           MRN: [id number] Visit Date: 06/10/2017              Requested by: [name], MD [address] [place], [place] [address] PCP: [name], MD  Chief Complaint  Patient presents with  . Right Foot - Follow-up    Hx toe amputation       HPI: Patient is status post a right great toe amputation. Has clawing of toes and onychomycotic nails. Patient also has venous stasis changes. States recently was admitted with new diagnosis of heart failure. Has begun Lasix and will be beginning cardiac rehab.  Is in new balance walking shoes. Would like to have bilateral foot check today.  . States wears compression hose. Is not wearing today.  Assessment & Plan: Visit Diagnoses:  No diagnosis found.  Plan: Recommended he continue his new balance walking sneakers with a wide toebox. recommended the medical compression stockings daily. Continue daily foot checks. May follow up in 3 months for nail trim.  Follow-Up Instructions: No Follow-up on file.   Ortho Exam  Patient is alert, oriented, no adenopathy, well-dressed, normal affect, normal respiratory effort. Examination of bilateral feet. No callus build up or skin breakdown. there is no redness no cellulitis no drainage no signs of infection.  he has good pulses bilaterally. Does have thickened and onychomycotic nails bilateral feet. Is unable to safely trim his own nails. Nails were trimmed today without incident.   Imaging: No results found.  Labs: Lab Results  Component Value Date   HGBA1C 7.2 (H) 04/01/2017   HGBA1C 7.7 (H) 08/14/2013   ESRSEDRATE 73 (H) 02/07/2015   ESRSEDRATE 22 (H) 09/15/2013   CRP 8.2 (H) 02/07/2015   REPTSTATUS 02/08/2015 FINAL 02/07/2015   CULT NO GROWTH 1 DAY 02/07/2015    Orders:  No orders of the defined types were placed in this encounter.  No orders of the defined types were placed  in this encounter.    Procedures: No procedures performed  Clinical Data: No additional findings.  ROS:  All other systems negative, except as noted in the HPI. Review of Systems  Constitutional: Negative for chills and fever.  Musculoskeletal: Negative for gait problem.  Skin: Negative for color change and wound.    Objective: Vital Signs: Ht 6\' 2"  (1.88 m)   Wt 225 lb (102.1 kg)   BMI 28.89 kg/m   Specialty Comments:  No specialty comments available.  PMFS History: Patient Active Problem List   Diagnosis Date Noted  . Acute on chronic congestive heart failure ([place])   . Status post coronary artery stent placement   . Acute renal failure superimposed on stage 3 chronic kidney disease ([place])   . Pneumonia 03/30/2017  . Insulin-requiring or dependent type II diabetes mellitus ([place]) 03/29/2017  . Acute hypoxemic respiratory failure ([place]) 03/29/2017  . Acute respiratory failure with hypoxia ([place])   . JVD (jugular venous distension)   . Pulmonary congestion   . Acquired contracture of Achilles tendon, right 08/19/2016  . Amputated great toe, right ([place]) 07/18/2016  . Onychomycosis 05/29/2016  . Diabetic polyneuropathy associated with type 2 diabetes mellitus ([place]) 04/09/2016  . CKD (chronic kidney disease), stage II 02/07/2015  . Unspecified hereditary and idiopathic peripheral neuropathy 09/15/2013  . Diabetes mellitus ([place]) 09/14/2013  . Malaise 08/14/2013  .  Intractable nausea and vomiting 08/14/2013  . Acute gastroenteritis 08/14/2013  . Hypertension 08/14/2013  . Chronic back pain 08/14/2013  . Glaucoma 08/14/2013  . Headache 08/14/2013  . Hypertensive urgency 08/14/2013   Past Medical History:  Diagnosis Date  . Arthritis   . CHF (congestive heart failure) ([place])   . DDD (degenerative disc disease), cervical   . Diabetes mellitus without complication ([place])    type II  . Diabetic neuropathy ([place])   . GERD (gastroesophageal reflux disease)   . Glaucoma     . Hypertension   . Renal disorder    Stage II    Family History  Problem Relation Age of Onset  . Diabetes Other   . Hyperlipidemia Other   . Hypertension Other   . Stroke Other   . Alzheimer's disease Other   . Thyroid disease Mother   . Diabetes Mellitus II Father     Past Surgical History:  Procedure Laterality Date  . AMPUTATION Right 07/09/2016   Procedure: Right Great Toe Amputation at Metatarsophalangeal Joint;  Surgeon: [name], MD;  Location: [facility];  Service: Orthopedics;  Laterality: Right;  . BACK SURGERY     4  . CORONARY STENT INTERVENTION N/A 04/06/2017   Procedure: CORONARY STENT INTERVENTION;  Surgeon: Nigel Mormon, MD;  Location: [facility];  Service: Cardiovascular;  Laterality: N/A;  . CORONARY STENT INTERVENTION N/A 04/07/2017   Procedure: CORONARY STENT INTERVENTION;  Surgeon: Nigel Mormon, MD;  Location: [facility];  Service: Cardiovascular;  Laterality: N/A;  . CYST EXCISION     on Back  . JOINT REPLACEMENT    . RIGHT/LEFT HEART CATH AND CORONARY ANGIOGRAPHY N/A 04/06/2017   Procedure: RIGHT/LEFT HEART CATH AND CORONARY ANGIOGRAPHY;  Surgeon: Nigel Mormon, MD;  Location: [facility];  Service: Cardiovascular;  Laterality: N/A;  . TOTAL HIP ARTHROPLASTY     Right    Social History   Occupational History  . Not on file  Tobacco Use  . Smoking status: Never Smoker  . Smokeless tobacco: Never Used  Substance and Sexual Activity  . Alcohol use: Yes    Comment: occ  . Drug use: No  . Sexual activity: Yes

## 2017-06-23 ENCOUNTER — Telehealth (HOSPITAL_COMMUNITY): Payer: Self-pay | Admitting: Pharmacist

## 2017-06-26 NOTE — Telephone Encounter (Signed)
Cardiac Rehab - Pharmacy Resident Documentation   Patient unable to be reached after three call attempts. Please complete allergy verification and medication review during patient's cardiac rehab appointment.   Jaysa Kise, PharmD PGY1 Acute Care Pharmacy Resident Pager: [phone]  

## 2017-06-30 ENCOUNTER — Ambulatory Visit (HOSPITAL_COMMUNITY): Payer: Medicare Other

## 2017-07-01 ENCOUNTER — Telehealth (HOSPITAL_COMMUNITY): Payer: Self-pay

## 2017-07-01 NOTE — Telephone Encounter (Signed)
Attempted to call patient to reschedule orientation - lm on vm

## 2017-07-03 ENCOUNTER — Telehealth (HOSPITAL_COMMUNITY): Payer: Self-pay

## 2017-07-03 NOTE — Telephone Encounter (Signed)
Patient returned phone call to reschedule orientation - Scheduled orientation on 08/20/2017. Patient will attend 9:45am exc class.

## 2017-07-06 ENCOUNTER — Ambulatory Visit (HOSPITAL_COMMUNITY): Payer: Medicare Other

## 2017-07-06 ENCOUNTER — Encounter (HOSPITAL_COMMUNITY): Payer: Self-pay | Admitting: Cardiology

## 2017-07-08 ENCOUNTER — Ambulatory Visit (HOSPITAL_COMMUNITY): Payer: Medicare Other

## 2017-07-10 ENCOUNTER — Ambulatory Visit (HOSPITAL_COMMUNITY): Payer: Medicare Other

## 2017-07-13 ENCOUNTER — Ambulatory Visit (HOSPITAL_COMMUNITY): Payer: Medicare Other

## 2017-07-15 ENCOUNTER — Ambulatory Visit (HOSPITAL_COMMUNITY): Payer: Medicare Other

## 2017-07-17 ENCOUNTER — Ambulatory Visit (HOSPITAL_COMMUNITY): Payer: Medicare Other

## 2017-07-20 ENCOUNTER — Ambulatory Visit (HOSPITAL_COMMUNITY): Payer: Medicare Other

## 2017-07-22 ENCOUNTER — Ambulatory Visit (HOSPITAL_COMMUNITY): Payer: Medicare Other

## 2017-07-24 ENCOUNTER — Ambulatory Visit (HOSPITAL_COMMUNITY): Payer: Medicare Other

## 2017-07-27 ENCOUNTER — Ambulatory Visit (HOSPITAL_COMMUNITY): Payer: Medicare Other

## 2017-07-29 ENCOUNTER — Ambulatory Visit (HOSPITAL_COMMUNITY): Payer: Medicare Other

## 2017-07-31 ENCOUNTER — Ambulatory Visit (HOSPITAL_COMMUNITY): Payer: Medicare Other

## 2017-08-03 ENCOUNTER — Ambulatory Visit (HOSPITAL_COMMUNITY): Payer: Medicare Other

## 2017-08-05 ENCOUNTER — Ambulatory Visit (HOSPITAL_COMMUNITY): Payer: Medicare Other

## 2017-08-07 ENCOUNTER — Ambulatory Visit (HOSPITAL_COMMUNITY): Payer: Medicare Other

## 2017-08-10 ENCOUNTER — Ambulatory Visit (HOSPITAL_COMMUNITY): Payer: Medicare Other

## 2017-08-12 ENCOUNTER — Ambulatory Visit (HOSPITAL_COMMUNITY): Payer: Medicare Other

## 2017-08-14 ENCOUNTER — Ambulatory Visit (HOSPITAL_COMMUNITY): Payer: Medicare Other

## 2017-08-17 ENCOUNTER — Ambulatory Visit (HOSPITAL_COMMUNITY): Payer: Medicare Other

## 2017-08-18 ENCOUNTER — Telehealth (HOSPITAL_COMMUNITY): Payer: Self-pay

## 2017-08-19 ENCOUNTER — Ambulatory Visit (HOSPITAL_COMMUNITY): Payer: Medicare Other

## 2017-08-19 NOTE — Telephone Encounter (Signed)
Cardiac Rehab - Pharmacy Resident Documentation   Patient unable to be reached. Please complete allergy verification and medication review during patient's cardiac rehab appointment. I left a voicemail asking for him to return my call, or if he could not, to bring in a list of his medications to his appointment.  Patterson Hammersmith PharmD PGY1 Pharmacy Practice Resident 08/19/2017 5:33 PM Pager: [phone]

## 2017-08-20 ENCOUNTER — Encounter (HOSPITAL_COMMUNITY)
Admission: RE | Admit: 2017-08-20 | Discharge: 2017-08-20 | Disposition: A | Discharge status: Home / Self Care | Payer: Medicare Other | Source: Ambulatory Visit | Attending: Cardiology | Admitting: Cardiology

## 2017-08-20 ENCOUNTER — Encounter (HOSPITAL_COMMUNITY): Payer: Self-pay

## 2017-08-20 VITALS — BP 136/64 | HR 73 | Ht 72.25 in | Wt 232.4 lb

## 2017-08-20 DIAGNOSIS — I110 Hypertensive heart disease with heart failure: Secondary | ICD-10-CM | POA: Diagnosis not present

## 2017-08-20 DIAGNOSIS — Z955 Presence of coronary angioplasty implant and graft: Secondary | ICD-10-CM | POA: Diagnosis present

## 2017-08-20 DIAGNOSIS — Z7982 Long term (current) use of aspirin: Secondary | ICD-10-CM | POA: Insufficient documentation

## 2017-08-20 DIAGNOSIS — Z79899 Other long term (current) drug therapy: Secondary | ICD-10-CM | POA: Diagnosis not present

## 2017-08-20 DIAGNOSIS — E119 Type 2 diabetes mellitus without complications: Secondary | ICD-10-CM | POA: Diagnosis not present

## 2017-08-20 DIAGNOSIS — I509 Heart failure, unspecified: Secondary | ICD-10-CM | POA: Diagnosis not present

## 2017-08-20 LAB — GLUCOSE, CAPILLARY: Glucose-Capillary: 132 mg/dL — ABNORMAL HIGH (ref 65–99)

## 2017-08-20 NOTE — Progress Notes (Signed)
David [name] 72 y.o. male DOB: 07-Aug-1945 MRN: [id number]      Nutrition Note  1. Status post coronary artery stent placement 04/06/17 S/P Stent Distal LAD, 04/07/17 S/P Stent Mid LAD    Past Medical History:  Diagnosis Date  . Arthritis   . CHF (congestive heart failure) ([place])   . DDD (degenerative disc disease), cervical   . Diabetes mellitus without complication ([place])    type II  . Diabetic neuropathy ([place])   . GERD (gastroesophageal reflux disease)   . Glaucoma   . Hypertension   . Renal disorder    Stage II   Meds reviewed. Levemir noted  HT: Ht Readings from Last 1 Encounters:  08/20/17 6' 0.25" (1.835 m)    WT: Wt Readings from Last 3 Encounters:  08/20/17 232 lb 5.8 oz (105.4 kg)  06/10/17 225 lb (102.1 kg)  04/09/17 225 lb (102.1 kg)     BMI 31.3   Current tobacco use? No   Labs:  Lipid Panel  No results found for: CHOL, TRIG, HDL, CHOLHDL, VLDL, LDLCALC, LDLDIRECT  Lab Results  Component Value Date   HGBA1C 7.2 (H) 04/01/2017   CBG (last 3)  Recent Labs    08/20/17 1039  GLUCAP 132*    Nutrition Note Spoke with pt. Nutrition plan and goals reviewed with pt. Pt is following Step 2 of the Therapeutic Lifestyle Changes diet. Pt wants to lose wt. Pt has not been actively trying to lose wt. Wt loss tips reviewed.  Pt is diabetic. Last A1c indicates blood glucose not optimally controlled. Pt states Dr. Buddy Duty started him on Humalog before meals. Pt checks CBG's "but does not like to check his blood sugar." Per discussion, pt has been ordered a CGM but has not filled the prescription. Pt fears re: CGM discussed. Pt expressed understanding of the information reviewed. Pt aware of nutrition education classes offered and plans on attending nutrition classes.  Nutrition Diagnosis ? Food-and nutrition-related knowledge deficit related to lack of exposure to information as related to diagnosis of: ? CVD ? DM  ? Overweight related to excessive energy intake as  evidenced by a BMI of 31.3  Nutrition Intervention ? Pt's individual nutrition plan and goals reviewed with pt.  Nutrition Goal(s):  ? Pt to identify food quantities necessary to achieve weight loss of 6-24 lb (2.7-10.9 kg) at graduation from cardiac rehab. Goal wt of 180-190 lb desired.  ? Pt to make good choices when eating out at restaurants. ? Pt to choose healthy, balanced snacks Plan:  Pt to attend nutrition classes ? Nutrition I ? Nutrition II ? Portion Distortion ? Diabetes Blitz ? Diabetes Q & A Will provide client-centered nutrition education as part of interdisciplinary care.   Monitor and evaluate progress toward nutrition goal with team.  Derek Mound, M.Ed, RD, LDN, CDE 08/20/2017 2:57 PM

## 2017-08-20 NOTE — Progress Notes (Signed)
Cardiac Rehab Medication Review by a Nurse  Does the patient  feel that his/her medications are working for him/her?  yes  Has the patient been experiencing any side effects to the medications prescribed?  no  Does the patient measure his/her own blood pressure or blood glucose at home?  yes   Does the patient have any problems obtaining medications due to transportation or finances?   no  Understanding of regimen: excellent Understanding of indications: excellent Potential of compliance: excellent    Nurse comments: [name] has a good understanding of his medications and what they are for.    Harrell Gave RN BSN 08/20/2017 9:59 AM

## 2017-08-20 NOTE — Progress Notes (Signed)
Cardiac Individual Treatment Plan  Patient Details  Name: David [name] MRN: [id number] Date of Birth: 08/29/1945 Referring Provider:     CARDIAC REHAB PHASE II ORIENTATION from 08/20/2017 in [place]  Referring Provider  Adrian Prows MD       Initial Encounter Date:    CARDIAC REHAB PHASE II ORIENTATION from 08/20/2017 in [place]  Date  08/20/17  Referring Provider  Adrian Prows MD       Visit Diagnosis: Status post coronary artery stent placement 04/06/17 S/P Stent Distal LAD, 04/07/17 S/P Stent Mid LAD  Patient's Home Medications on Admission:  Current Outpatient Medications:  .  albuterol (PROVENTIL HFA;VENTOLIN HFA) 108 (90 Base) MCG/ACT inhaler, Inhale 2 puffs into the lungs every 6 (six) hours as needed for wheezing or shortness of breath., Disp: 1 Inhaler, Rfl: 2 .  ALPRAZolam (XANAX) 1 MG tablet, TAKE 1 TABLET BY MOUTH UP TO TWICE DAILY AS NEEDED FOR ANXEITY, Disp: , Rfl: 2 .  amLODipine (NORVASC) 5 MG tablet, Take 1 tablet (5 mg total) by mouth daily., Disp: 30 tablet, Rfl: 1 .  aspirin EC 81 MG tablet, Take 81 mg by mouth daily., Disp: , Rfl:  .  atorvastatin (LIPITOR) 40 MG tablet, Take 40 mg by mouth at bedtime., Disp: , Rfl: 11 .  clopidogrel (PLAVIX) 75 MG tablet, Take 1 tablet (75 mg total) by mouth daily., Disp: 30 tablet, Rfl: 1 .  docusate sodium (COLACE) 100 MG capsule, Take 100 mg by mouth daily., Disp: , Rfl:  .  fluticasone (FLONASE) 50 MCG/ACT nasal spray, Place 1 spray into both nostrils daily., Disp: 16 g, Rfl: 1 .  furosemide (LASIX) 20 MG tablet, Take 1 tablet (20 mg total) by mouth daily. Take twice daily for 5 days, and then daily after that, Disp: 30 tablet, Rfl: 1 .  gabapentin (NEURONTIN) 600 MG tablet, Take 600 mg by mouth 2 (two) times daily. , Disp: , Rfl:  .  HYDROcodone-acetaminophen (NORCO) 10-325 MG tablet, Take 1 tablet by mouth 2 (two) times daily as needed for moderate pain. ,  Disp: , Rfl: 0 .  Insulin Detemir (LEVEMIR FLEXTOUCH) 100 UNIT/ML Pen, Inject 40 Units into the skin daily. (Patient taking differently: Inject 42 Units into the skin daily. ), Disp: 3 mL, Rfl: 1 .  isosorbide-hydrALAZINE (BIDIL) 20-37.5 MG tablet, Take 1 tablet by mouth 3 (three) times daily., Disp: 90 tablet, Rfl: 0 .  LUMIGAN 0.01 % SOLN, Place 1 drop into both eyes at bedtime., Disp: , Rfl: 99 .  metoprolol succinate (TOPROL-XL) 100 MG 24 hr tablet, Take 1 tablet (100 mg total) by mouth daily. Take with or immediately following a meal., Disp: 30 tablet, Rfl: 1 .  iron polysaccharides (NIFEREX) 150 MG capsule, Take 1 capsule (150 mg total) by mouth daily. (Patient not taking: Reported on 08/20/2017), Disp: 30 capsule, Rfl: 1 .  methocarbamol (ROBAXIN) 750 MG tablet, Take 1 tablet (750 mg total) by mouth every 6 (six) hours as needed for muscle spasms. (Patient not taking: Reported on 08/20/2017), Disp: 30 tablet, Rfl: 0  Past Medical History: Past Medical History:  Diagnosis Date  . Arthritis   . CHF (congestive heart failure) ([place])   . DDD (degenerative disc disease), cervical   . Diabetes mellitus without complication ([place])    type II  . Diabetic neuropathy ([place])   . GERD (gastroesophageal reflux disease)   . Glaucoma   . Hypertension   .  Renal disorder    Stage II    Tobacco Use: Social History   Tobacco Use  Smoking Status Never Smoker  Smokeless Tobacco Never Used    Labs: Recent Review Flowsheet Data    Labs for ITP Cardiac and Pulmonary Rehab Latest Ref Rng & Units 08/14/2013 03/29/2017 04/01/2017 04/06/2017 04/06/2017   Hemoglobin A1c 4.8 - 5.6 % 7.7(H) - 7.2(H) - -   PHART 7.350 - 7.450 - 7.388 - - 7.403   PCO2ART 32.0 - 48.0 mmHg - 46.1 - - 51.4(H)   HCO3 20.0 - 28.0 mmol/L - 27.7 - 32.7(H) 32.0(H)   TCO2 22 - 32 mmol/L - 29 - 34(H) 34(H)   O2SAT % - 99.0 - 67.0 99.0      Capillary Blood Glucose: Lab Results  Component Value Date   GLUCAP 132 (H) 08/20/2017    GLUCAP 120 (H) 04/09/2017   GLUCAP 67 04/09/2017   GLUCAP 207 (H) 04/08/2017   GLUCAP 123 (H) 04/08/2017     Exercise Target Goals: Date: 08/20/17  Exercise Program Goal: Individual exercise prescription set using results from initial 6 min walk test and THRR while considering  patient's activity barriers and safety.   Exercise Prescription Goal: Initial exercise prescription builds to 30-45 minutes a day of aerobic activity, 2-3 days per week.  Home exercise guidelines will be given to patient during program as part of exercise prescription that the participant will acknowledge.  Activity Barriers & Risk Stratification: Activity Barriers & Cardiac Risk Stratification - 08/20/17 1300      Activity Barriers & Cardiac Risk Stratification   Comments  Right Artificial Hip,  history Left Ankle Broken: Occasional Pain, Amputated Toe        6 Minute Walk: 6 Minute Walk    Row Name 08/20/17 1242         6 Minute Walk   Phase  Initial     Distance  900 feet     Walk Time  6 minutes     # of Rest Breaks  0     MPH  1.7     METS  2.1     RPE  11     Perceived Dyspnea   0     VO2 Peak  7.4     Symptoms  Yes (comment)     Comments  Pain in Left Hip and Lower Back 4/10     Resting HR  71 bpm     Resting BP  136/64     Resting Oxygen Saturation   98 %     Exercise Oxygen Saturation  during 6 min walk  95 %     Max Ex. HR  91 bpm     Max Ex. BP  160/76     2 Minute Post BP  128/60        Oxygen Initial Assessment:   Oxygen Re-Evaluation:   Oxygen Discharge (Final Oxygen Re-Evaluation):   Initial Exercise Prescription: Initial Exercise Prescription - 08/20/17 1200      Date of Initial Exercise RX and Referring Provider   Date  08/20/17    Referring Provider  Adrian Prows MD       NuStep   Level  2    SPM  70    Minutes  20    METs  1.9      Arm Ergometer   Level  2    Watts  25    Minutes  10    METs  2.05      Prescription Details   Frequency (times per  week)  3x    Duration  Progress to 30 minutes of continuous aerobic without signs/symptoms of physical distress      Intensity   THRR 40-80% of Max Heartrate  60-120    Ratings of Perceived Exertion  11-13    Perceived Dyspnea  0-4      Progression   Progression  Continue progressive overload as per policy without signs/symptoms or physical distress.      Resistance Training   Training Prescription  Yes    Weight  3lbs    Reps  10-15       Perform Capillary Blood Glucose checks as needed.  Exercise Prescription Changes:   Exercise Comments:   Exercise Goals and Review:  Exercise Goals    Row Name 08/20/17 1246             Exercise Goals   Increase Physical Activity  Yes       Intervention  Provide advice, education, support and counseling about physical activity/exercise needs.;Develop an individualized exercise prescription for aerobic and resistive training based on initial evaluation findings, risk stratification, comorbidities and participant's personal goals.       Expected Outcomes  Short Term: Attend rehab on a regular basis to increase amount of physical activity.;Long Term: Add in home exercise to make exercise part of routine and to increase amount of physical activity.;Long Term: Exercising regularly at least 3-5 days a week.       Increase Strength and Stamina  Yes       Intervention  Provide advice, education, support and counseling about physical activity/exercise needs.;Develop an individualized exercise prescription for aerobic and resistive training based on initial evaluation findings, risk stratification, comorbidities and participant's personal goals.       Expected Outcomes  Short Term: Increase workloads from initial exercise prescription for resistance, speed, and METs.;Short Term: Perform resistance training exercises routinely during rehab and add in resistance training at home;Long Term: Improve cardiorespiratory fitness, muscular endurance and  strength as measured by increased METs and functional capacity (6MWT)       Able to understand and use rate of perceived exertion (RPE) scale  Yes       Intervention  Provide education and explanation on how to use RPE scale       Expected Outcomes  Short Term: Able to use RPE daily in rehab to express subjective intensity level;Long Term:  Able to use RPE to guide intensity level when exercising independently       Knowledge and understanding of Target Heart Rate Range (THRR)  Yes       Intervention  Provide education and explanation of THRR including how the numbers were predicted and where they are located for reference       Expected Outcomes  Short Term: Able to state/look up THRR;Short Term: Able to use daily as guideline for intensity in rehab;Long Term: Able to use THRR to govern intensity when exercising independently       Able to check pulse independently  Yes       Intervention  Provide education and demonstration on how to check pulse in carotid and radial arteries.;Review the importance of being able to check your own pulse for safety during independent exercise       Expected Outcomes  Short Term: Able to explain why pulse checking is important during independent exercise;Long Term: Able to check pulse independently and accurately  Understanding of Exercise Prescription  Yes       Intervention  Provide education, explanation, and written materials on patient's individual exercise prescription       Expected Outcomes  Short Term: Able to explain program exercise prescription;Long Term: Able to explain home exercise prescription to exercise independently          Exercise Goals Re-Evaluation :    Discharge Exercise Prescription (Final Exercise Prescription Changes):   Nutrition:  Target Goals: Understanding of nutrition guidelines, daily intake of sodium 1500mg , cholesterol 200mg , calories 30% from fat and 7% or less from saturated fats, daily to have 5 or more servings of  fruits and vegetables.  Biometrics: Pre Biometrics - 08/20/17 1243      Pre Biometrics   Height  6' 0.25" (1.835 m)    Weight  232 lb 5.8 oz (105.4 kg)    Waist Circumference  47.5 inches    Hip Circumference  44.5 inches    Waist to Hip Ratio  1.07 %    BMI (Calculated)  31.3    Triceps Skinfold  10 mm    % Body Fat  30.4 %    Grip Strength  30 kg    Flexibility  0 in    Single Leg Stand  0 seconds        Nutrition Therapy Plan and Nutrition Goals:   Nutrition Assessments:   Nutrition Goals Re-Evaluation:   Nutrition Goals Re-Evaluation:   Nutrition Goals Discharge (Final Nutrition Goals Re-Evaluation):   Psychosocial: Target Goals: Acknowledge presence or absence of significant depression and/or stress, maximize coping skills, provide positive support system. Participant is able to verbalize types and ability to use techniques and skills needed for reducing stress and depression.  Initial Review & Psychosocial Screening: Initial Psych Review & Screening - 08/20/17 1254      Initial Review   Current issues with  None Identified      Family Dynamics   Good Support System?  Yes      Barriers   Psychosocial barriers to participate in program  There are no identifiable barriers or psychosocial needs.      Screening Interventions   Interventions  Encouraged to exercise       Quality of Life Scores: Quality of Life - 08/20/17 1226      Quality of Life Scores   Health/Function Pre  22.57 %    Socioeconomic Pre  26.43 %    Psych/Spiritual Pre  27.43 %    Family Pre  28.8 %    GLOBAL Pre  25.28 %      Scores of 19 and below usually indicate a poorer quality of life in these areas.  A difference of  2-3 points is a clinically meaningful difference.  A difference of 2-3 points in the total score of the Quality of Life Index has been associated with significant improvement in overall quality of life, self-image, physical symptoms, and general health in studies  assessing change in quality of life.  PHQ-9: Recent Review Flowsheet Data    Depression screen [facility] 2/9 07/03/2014   Decreased Interest 1   Down, Depressed, Hopeless 1   PHQ - 2 Score 2   Change in appetite 1   Feeling bad or failure about yourself  1   Moving slowly or fidgety/restless 0     Interpretation of Total Score  Total Score Depression Severity:  1-4 = Minimal depression, 5-9 = Mild depression, 10-14 = Moderate depression, 15-19 = Moderately  severe depression, 20-27 = Severe depression   Psychosocial Evaluation and Intervention:   Psychosocial Re-Evaluation:   Psychosocial Discharge (Final Psychosocial Re-Evaluation):   Vocational Rehabilitation: Provide vocational rehab assistance to qualifying candidates.   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 Participant will state understanding/return demonstration of topics presented.  Learning Barriers/Preferences: Learning Barriers/Preferences - 08/19/17 1041      Learning Barriers/Preferences   Learning Barriers  Sight    Learning Preferences  Written Material       Education Topics: Count Your Pulse:  -Group instruction provided by verbal instruction, demonstration, patient participation and written materials to support subject.  Instructors address importance of being able to find your pulse and how to count your pulse when at home without a heart monitor.  Patients get hands on experience counting their pulse with staff help and individually.   Heart Attack, Angina, and Risk Factor Modification:  -Group instruction provided by verbal instruction, video, and written materials to support subject.   Instructors address signs and symptoms of angina and heart attacks.    Also discuss risk factors for heart disease and how to make changes to improve heart health risk factors.   Functional Fitness:  -Group instruction provided by verbal instruction, demonstration, patient participation, and written materials to support subject.  Instructors address safety measures for doing things around the house.  Discuss how to get up and down off the floor, how to pick things up properly, how to safely get out of a chair without assistance, and balance training.   Meditation and Mindfulness:  -Group instruction provided by verbal instruction, patient participation, and written materials to support subject.  Instructor addresses importance of mindfulness and meditation practice to help reduce stress and improve awareness.  Instructor also leads participants through a meditation exercise.    Stretching for Flexibility and Mobility:  -Group instruction provided by verbal instruction, patient participation, and written materials to support subject.  Instructors lead participants through series of stretches that are designed to increase flexibility thus improving mobility.  These stretches are additional exercise for major muscle groups that are typically performed during regular warm up and cool down.   Hands Only CPR:  -Group verbal, video, and participation provides a basic overview of AHA guidelines for community CPR. Role-play of emergencies allow participants the opportunity to practice calling for help and chest compression technique with discussion of AED use.   Hypertension: -Group verbal and written instruction that provides a basic overview of hypertension including the most recent diagnostic guidelines, risk factor reduction with self-care instructions and medication management.    Nutrition I class: Heart Healthy Eating:  -Group instruction provided by PowerPoint slides, verbal discussion, and  written materials to support subject matter. The instructor gives an explanation and review of the Therapeutic Lifestyle Changes diet recommendations, which includes a discussion on lipid goals, dietary fat, sodium, fiber, plant stanol/sterol esters, sugar, and the components of a well-balanced, healthy diet.   Nutrition II class: Lifestyle Skills:  -Group instruction provided by PowerPoint slides, verbal discussion, and written materials to support subject matter. The instructor gives an explanation and review of label reading, grocery shopping for heart health, heart healthy recipe modifications, and  ways to make healthier choices when eating out.   Diabetes Question & Answer:  -Group instruction provided by PowerPoint slides, verbal discussion, and written materials to support subject matter. The instructor gives an explanation and review of diabetes co-morbidities, pre- and post-prandial blood glucose goals, pre-exercise blood glucose goals, signs, symptoms, and treatment of hypoglycemia and hyperglycemia, and foot care basics.   Diabetes Blitz:  -Group instruction provided by PowerPoint slides, verbal discussion, and written materials to support subject matter. The instructor gives an explanation and review of the physiology behind type 1 and type 2 diabetes, diabetes medications and rational behind using different medications, pre- and post-prandial blood glucose recommendations and Hemoglobin A1c goals, diabetes diet, and exercise including blood glucose guidelines for exercising safely.    Portion Distortion:  -Group instruction provided by PowerPoint slides, verbal discussion, written materials, and food models to support subject matter. The instructor gives an explanation of serving size versus portion size, changes in portions sizes over the last 20 years, and what consists of a serving from each food group.   Stress Management:  -Group instruction provided by verbal instruction,  video, and written materials to support subject matter.  Instructors review role of stress in heart disease and how to cope with stress positively.     Exercising on Your Own:  -Group instruction provided by verbal instruction, power point, and written materials to support subject.  Instructors discuss benefits of exercise, components of exercise, frequency and intensity of exercise, and end points for exercise.  Also discuss use of nitroglycerin and activating EMS.  Review options of places to exercise outside of rehab.  Review guidelines for sex with heart disease.   Cardiac Drugs I:  -Group instruction provided by verbal instruction and written materials to support subject.  Instructor reviews cardiac drug classes: antiplatelets, anticoagulants, beta blockers, and statins.  Instructor discusses reasons, side effects, and lifestyle considerations for each drug class.   Cardiac Drugs II:  -Group instruction provided by verbal instruction and written materials to support subject.  Instructor reviews cardiac drug classes: angiotensin converting enzyme inhibitors (ACE-I), angiotensin II receptor blockers (ARBs), nitrates, and calcium channel blockers.  Instructor discusses reasons, side effects, and lifestyle considerations for each drug class.   Anatomy and Physiology of the Circulatory System:  Group verbal and written instruction and models provide basic cardiac anatomy and physiology, with the coronary electrical and arterial systems. Review of: AMI, Angina, Valve disease, Heart Failure, Peripheral Artery Disease, Cardiac Arrhythmia, Pacemakers, and the ICD.   Other Education:  -Group or individual verbal, written, or video instructions that support the educational goals of the cardiac rehab program.   Holiday Eating Survival Tips:  -Group instruction provided by PowerPoint slides, verbal discussion, and written materials to support subject matter. The instructor gives patients tips,  tricks, and techniques to help them not only survive but enjoy the holidays despite the onslaught of food that accompanies the holidays.   Knowledge Questionnaire Score: Knowledge Questionnaire Score - 08/20/17 1226      Knowledge Questionnaire Score   Pre Score  18/24       Core Components/Risk Factors/Patient Goals at Admission: Personal Goals and Risk Factors at Admission - 08/20/17 1236      Core Components/Risk Factors/Patient Goals on Admission    Weight Management  Yes    Intervention  Weight Management: Develop a combined nutrition and exercise program designed to reach desired caloric intake, while maintaining appropriate intake of nutrient and fiber, sodium and fats, and appropriate energy  expenditure required for the weight goal.;Weight Management: Provide education and appropriate resources to help participant work on and attain dietary goals.;Weight Management/Obesity: Establish reasonable short term and long term weight goals.    Admit Weight  232 lb 5.8 oz (105.4 kg)    Goal Weight: Short Term  225 lb (102.1 kg)    Goal Weight: Long Term  220 lb (99.8 kg)    Expected Outcomes  Short Term: Continue to assess and modify interventions until short term weight is achieved;Long Term: Adherence to nutrition and physical activity/exercise program aimed toward attainment of established weight goal;Weight Loss: Understanding of general recommendations for a balanced deficit meal plan, which promotes 1-2 lb weight loss per week and includes a negative energy balance of [phone] kcal/d;Understanding recommendations for meals to include 15-35% energy as protein, 25-35% energy from fat, 35-60% energy from carbohydrates, less than 200mg  of dietary cholesterol, 20-35 gm of total fiber daily;Understanding of distribution of calorie intake throughout the day with the consumption of 4-5 meals/snacks    Diabetes  Yes    Intervention  Provide education about signs/symptoms and action to take for  hypo/hyperglycemia.;Provide education about proper nutrition, including hydration, and aerobic/resistive exercise prescription along with prescribed medications to achieve blood glucose in normal ranges: Fasting glucose 65-99 mg/dL    Expected Outcomes  Short Term: Participant verbalizes understanding of the signs/symptoms and immediate care of hyper/hypoglycemia, proper foot care and importance of medication, aerobic/resistive exercise and nutrition plan for blood glucose control.;Long Term: Attainment of HbA1C < 7%.    Hypertension  Yes    Intervention  Provide education on lifestyle modifcations including regular physical activity/exercise, weight management, moderate sodium restriction and increased consumption of fresh fruit, vegetables, and low fat dairy, alcohol moderation, and smoking cessation.;Monitor prescription use compliance.    Expected Outcomes  Short Term: Continued assessment and intervention until BP is < 140/9mm HG in hypertensive participants. < 130/94mm HG in hypertensive participants with diabetes, heart failure or chronic kidney disease.;Long Term: Maintenance of blood pressure at goal levels.    Lipids  Yes    Intervention  Provide education and support for participant on nutrition & aerobic/resistive exercise along with prescribed medications to achieve LDL 70mg , HDL >40mg .    Expected Outcomes  Short Term: Participant states understanding of desired cholesterol values and is compliant with medications prescribed. Participant is following exercise prescription and nutrition guidelines.;Long Term: Cholesterol controlled with medications as prescribed, with individualized exercise RX and with personalized nutrition plan. Value goals: LDL < 70mg , HDL > 40 mg.       Core Components/Risk Factors/Patient Goals Review:    Core Components/Risk Factors/Patient Goals at Discharge (Final Review):    ITP Comments: ITP Comments    Row Name 08/20/17 0922           ITP Comments   Dr. Fransico Him, Medical Director          Comments: [name] attended orientation from [phone] to 1100 to review rules and guidelines for program. Completed 6 minute walk test, Intitial ITP, and exercise prescription.  VSS. Telemetry-Sinus Rhythm. [name] does have chronic hip pain and used a rolling walker for stability. [name] completed his walk test without difficulty.Barnet Pall, RN,BSN 08/20/2017 1:06 PM

## 2017-08-21 ENCOUNTER — Ambulatory Visit (HOSPITAL_COMMUNITY): Payer: Medicare Other

## 2017-08-24 ENCOUNTER — Encounter (HOSPITAL_COMMUNITY)
Admission: RE | Admit: 2017-08-24 | Discharge: 2017-08-24 | Disposition: A | Discharge status: Home / Self Care | Payer: Medicare Other | Source: Ambulatory Visit | Attending: Cardiology | Admitting: Cardiology

## 2017-08-24 ENCOUNTER — Encounter (HOSPITAL_COMMUNITY): Payer: Medicare Other

## 2017-08-24 ENCOUNTER — Ambulatory Visit (HOSPITAL_COMMUNITY): Payer: Medicare Other

## 2017-08-24 ENCOUNTER — Telehealth (HOSPITAL_COMMUNITY): Payer: Self-pay | Admitting: *Deleted

## 2017-08-24 DIAGNOSIS — Z955 Presence of coronary angioplasty implant and graft: Secondary | ICD-10-CM | POA: Diagnosis not present

## 2017-08-24 LAB — GLUCOSE, CAPILLARY: GLUCOSE-CAPILLARY: 167 mg/dL — AB (ref 65–99)

## 2017-08-24 NOTE — Progress Notes (Signed)
Incomplete Session Note  Patient Details  Name: David [name] MRN: [id number] Date of Birth: 1945/11/22 Referring Provider:     CARDIAC REHAB PHASE II ORIENTATION from 08/20/2017 in [place]  Referring Provider  Adrian Prows MD       David [name] did not complete his rehab session.  David [name] reported to exercise today. David [name]'s right elbow is swollen. Patient said he hit his elbow getting out of his sports car. I advised that David [name] not exercise today and to have his arm evaluated by his primary care physician. David [name] plans to return to exercise on Wednesday. Barnet Pall, RN,BSN 08/24/2017 9:52 AM

## 2017-08-26 ENCOUNTER — Encounter (HOSPITAL_COMMUNITY)
Admission: RE | Admit: 2017-08-26 | Discharge: 2017-08-26 | Disposition: A | Discharge status: Home / Self Care | Payer: Medicare Other | Source: Ambulatory Visit | Attending: Cardiology | Admitting: Cardiology

## 2017-08-26 ENCOUNTER — Encounter (HOSPITAL_COMMUNITY): Payer: Medicare Other

## 2017-08-26 ENCOUNTER — Ambulatory Visit (HOSPITAL_COMMUNITY): Payer: Medicare Other

## 2017-08-26 DIAGNOSIS — Z955 Presence of coronary angioplasty implant and graft: Secondary | ICD-10-CM

## 2017-08-26 LAB — GLUCOSE, CAPILLARY
GLUCOSE-CAPILLARY: 200 mg/dL — AB (ref 65–99)
GLUCOSE-CAPILLARY: 132 mg/dL — AB (ref 65–99)

## 2017-08-26 NOTE — Progress Notes (Signed)
Daily Session Note  Patient Details  Name: David [name] MRN: [id number] Date of Birth: August 11, 1945 Referring Provider:     CARDIAC REHAB PHASE II ORIENTATION from 08/20/2017 in [place]  Referring Provider  Adrian Prows MD       Encounter Date: 08/26/2017  Check In: Session Check In - 08/26/17 1044      Check-In   Location  [facility]    Staff Present  Amber Fair, MS, ACSM RCEP, Exercise Physiologist;Tyara Nevels, MS,ACSM CEP, Exercise Physiologist;Maria Whitaker, RN, Deland Pretty, MS, ACSM CEP, Exercise Physiologist    Supervising physician immediately available to respond to emergencies  [facility] immediately available    Physician(s)  Dr. Alfredia Ferguson    Medication changes reported      No    Fall or balance concerns reported     No    Tobacco Cessation  No Change    Warm-up and Cool-down  Performed as group-led instruction    Resistance Training Performed  No    VAD Patient?  No      Pain Assessment   Currently in Pain?  No/denies    Multiple Pain Sites  No       Capillary Blood Glucose: Results for orders placed or performed during the hospital encounter of 08/26/17 (from the past 24 hour(s))  Glucose, capillary     Status: Abnormal   Collection Time: 08/26/17  9:37 AM  Result Value Ref Range   Glucose-Capillary 200 (H) 65 - 99 mg/dL  Glucose, capillary     Status: Abnormal   Collection Time: 08/26/17 10:46 AM  Result Value Ref Range   Glucose-Capillary 132 (H) 65 - 99 mg/dL      Social History   Tobacco Use  Smoking Status Never Smoker  Smokeless Tobacco Never Used    Goals Met:  Exercise tolerated well  Goals Unmet:  Not Applicable  Comments: Pt started cardiac rehab today.  Pt tolerated light exercise without difficulty. VSS, telemetry-Sinus Rhythm, asymptomatic.  Medication list reconciled. Pt denies barriers to medicaiton compliance.  PSYCHOSOCIAL ASSESSMENT:  PHQ-0. Pt exhibits positive  coping skills, hopeful outlook with supportive family. No psychosocial needs identified at this time, no psychosocial interventions necessary.    Pt hopes to play golf again.   Pt oriented to exercise equipment and routine.    Understanding verbalized. [name] received permission to start exercise per Dr [name]. No complaints voiced today.Will continue to monitor the patient throughout  the program.Maria Venetia Maxon, RN,BSN 08/26/2017 5:03 PM    Dr. Fransico Him is Medical Director for Cardiac Rehab at [facility].

## 2017-08-28 ENCOUNTER — Ambulatory Visit (HOSPITAL_COMMUNITY): Payer: Medicare Other

## 2017-08-28 ENCOUNTER — Encounter (HOSPITAL_COMMUNITY): Payer: Medicare Other

## 2017-08-28 ENCOUNTER — Encounter (HOSPITAL_COMMUNITY)
Admission: RE | Admit: 2017-08-28 | Discharge: 2017-08-28 | Disposition: A | Discharge status: Home / Self Care | Payer: Medicare Other | Source: Ambulatory Visit | Attending: Cardiology | Admitting: Cardiology

## 2017-08-28 DIAGNOSIS — Z955 Presence of coronary angioplasty implant and graft: Secondary | ICD-10-CM | POA: Diagnosis not present

## 2017-08-28 LAB — GLUCOSE, CAPILLARY
Glucose-Capillary: 254 mg/dL — ABNORMAL HIGH (ref 65–99)
Glucose-Capillary: 227 mg/dL — ABNORMAL HIGH (ref 65–99)

## 2017-08-31 ENCOUNTER — Encounter (HOSPITAL_COMMUNITY): Payer: Medicare Other

## 2017-08-31 ENCOUNTER — Ambulatory Visit (HOSPITAL_COMMUNITY): Payer: Medicare Other

## 2017-08-31 ENCOUNTER — Encounter (HOSPITAL_COMMUNITY)
Admission: RE | Admit: 2017-08-31 | Discharge: 2017-08-31 | Disposition: A | Discharge status: Home / Self Care | Payer: Medicare Other | Source: Ambulatory Visit | Attending: Cardiology | Admitting: Cardiology

## 2017-08-31 DIAGNOSIS — Z955 Presence of coronary angioplasty implant and graft: Secondary | ICD-10-CM

## 2017-08-31 LAB — GLUCOSE, CAPILLARY
GLUCOSE-CAPILLARY: 131 mg/dL — AB (ref 65–99)
GLUCOSE-CAPILLARY: 88 mg/dL (ref 65–99)
Glucose-Capillary: 102 mg/dL — ABNORMAL HIGH (ref 65–99)

## 2017-08-31 NOTE — Progress Notes (Signed)
Home exercise guidelines given to patient including endpoints, temperature precautions, target heart rate and rate of perceived exertion. Pt plans to return to exercise at fitness center as his mode of home exercise. Pt states he's exercised in the past and already knows what he needs to do. Pt voices understanding of instructions given. Sol Passer, MS, ACSM CEP

## 2017-09-01 NOTE — Progress Notes (Signed)
Cardiac Individual Treatment Plan  Patient Details  Name: David [name] MRN: [id number] Date of Birth: Dec 30, 1945 Referring Provider:     CARDIAC REHAB PHASE II ORIENTATION from 08/20/2017 in [place]  Referring Provider  Adrian Prows MD       Initial Encounter Date:    CARDIAC REHAB PHASE II ORIENTATION from 08/20/2017 in [place]  Date  08/20/17  Referring Provider  Adrian Prows MD       Visit Diagnosis: Status post coronary artery stent placement 04/06/17 S/P Stent Distal LAD, 04/07/17 S/P Stent Mid LAD  Patient's Home Medications on Admission:  Current Outpatient Medications:  .  albuterol (PROVENTIL HFA;VENTOLIN HFA) 108 (90 Base) MCG/ACT inhaler, Inhale 2 puffs into the lungs every 6 (six) hours as needed for wheezing or shortness of breath., Disp: 1 Inhaler, Rfl: 2 .  ALPRAZolam (XANAX) 1 MG tablet, TAKE 1 TABLET BY MOUTH UP TO TWICE DAILY AS NEEDED FOR ANXEITY, Disp: , Rfl: 2 .  amLODipine (NORVASC) 5 MG tablet, Take 1 tablet (5 mg total) by mouth daily., Disp: 30 tablet, Rfl: 1 .  aspirin EC 81 MG tablet, Take 81 mg by mouth daily., Disp: , Rfl:  .  atorvastatin (LIPITOR) 40 MG tablet, Take 40 mg by mouth at bedtime., Disp: , Rfl: 11 .  clopidogrel (PLAVIX) 75 MG tablet, Take 1 tablet (75 mg total) by mouth daily., Disp: 30 tablet, Rfl: 1 .  docusate sodium (COLACE) 100 MG capsule, Take 100 mg by mouth daily., Disp: , Rfl:  .  fluticasone (FLONASE) 50 MCG/ACT nasal spray, Place 1 spray into both nostrils daily., Disp: 16 g, Rfl: 1 .  furosemide (LASIX) 20 MG tablet, Take 1 tablet (20 mg total) by mouth daily. Take twice daily for 5 days, and then daily after that, Disp: 30 tablet, Rfl: 1 .  gabapentin (NEURONTIN) 600 MG tablet, Take 600 mg by mouth 2 (two) times daily. , Disp: , Rfl:  .  HYDROcodone-acetaminophen (NORCO) 10-325 MG tablet, Take 1 tablet by mouth 2 (two) times daily as needed for moderate pain. ,  Disp: , Rfl: 0 .  Insulin Detemir (LEVEMIR FLEXTOUCH) 100 UNIT/ML Pen, Inject 40 Units into the skin daily. (Patient taking differently: Inject 42 Units into the skin daily. ), Disp: 3 mL, Rfl: 1 .  iron polysaccharides (NIFEREX) 150 MG capsule, Take 1 capsule (150 mg total) by mouth daily., Disp: 30 capsule, Rfl: 1 .  isosorbide-hydrALAZINE (BIDIL) 20-37.5 MG tablet, Take 1 tablet by mouth 3 (three) times daily., Disp: 90 tablet, Rfl: 0 .  LUMIGAN 0.01 % SOLN, Place 1 drop into both eyes at bedtime., Disp: , Rfl: 99 .  methocarbamol (ROBAXIN) 750 MG tablet, Take 1 tablet (750 mg total) by mouth every 6 (six) hours as needed for muscle spasms., Disp: 30 tablet, Rfl: 0 .  metoprolol succinate (TOPROL-XL) 100 MG 24 hr tablet, Take 1 tablet (100 mg total) by mouth daily. Take with or immediately following a meal., Disp: 30 tablet, Rfl: 1  Past Medical History: Past Medical History:  Diagnosis Date  . Arthritis   . CHF (congestive heart failure) ([place])   . DDD (degenerative disc disease), cervical   . Diabetes mellitus without complication ([place])    type II  . Diabetic neuropathy ([place])   . GERD (gastroesophageal reflux disease)   . Glaucoma   . Hypertension   . Renal disorder    Stage II    Tobacco Use:  Social History   Tobacco Use  Smoking Status Never Smoker  Smokeless Tobacco Never Used    Labs: Recent Review Flowsheet Data    Labs for ITP Cardiac and Pulmonary Rehab Latest Ref Rng & Units 08/14/2013 03/29/2017 04/01/2017 04/06/2017 04/06/2017   Hemoglobin A1c 4.8 - 5.6 % 7.7(H) - 7.2(H) - -   PHART 7.350 - 7.450 - 7.388 - - 7.403   PCO2ART 32.0 - 48.0 mmHg - 46.1 - - 51.4(H)   HCO3 20.0 - 28.0 mmol/L - 27.7 - 32.7(H) 32.0(H)   TCO2 22 - 32 mmol/L - 29 - 34(H) 34(H)   O2SAT % - 99.0 - 67.0 99.0      Capillary Blood Glucose: Lab Results  Component Value Date   GLUCAP 102 (H) 08/31/2017   GLUCAP 88 08/31/2017   GLUCAP 131 (H) 08/31/2017   GLUCAP 227 (H) 08/28/2017    GLUCAP 254 (H) 08/28/2017     Exercise Target Goals:    Exercise Program Goal: Individual exercise prescription set using results from initial 6 min walk test and THRR while considering  patient's activity barriers and safety.   Exercise Prescription Goal: Initial exercise prescription builds to 30-45 minutes a day of aerobic activity, 2-3 days per week.  Home exercise guidelines will be given to patient during program as part of exercise prescription that the participant will acknowledge.  Activity Barriers & Risk Stratification: Activity Barriers & Cardiac Risk Stratification - 08/20/17 1300      Activity Barriers & Cardiac Risk Stratification   Comments  Right Artificial Hip,  history Left Ankle Broken: Occasional Pain, Amputated Toe        6 Minute Walk: 6 Minute Walk    Row Name 08/20/17 1242         6 Minute Walk   Phase  Initial     Distance  900 feet     Walk Time  6 minutes     # of Rest Breaks  0     MPH  1.7     METS  2.1     RPE  11     Perceived Dyspnea   0     VO2 Peak  7.4     Symptoms  Yes (comment)     Comments  Pain in Left Hip and Lower Back 4/10     Resting HR  71 bpm     Resting BP  136/64     Resting Oxygen Saturation   98 %     Exercise Oxygen Saturation  during 6 min walk  95 %     Max Ex. HR  91 bpm     Max Ex. BP  160/76     2 Minute Post BP  128/60        Oxygen Initial Assessment:   Oxygen Re-Evaluation:   Oxygen Discharge (Final Oxygen Re-Evaluation):   Initial Exercise Prescription: Initial Exercise Prescription - 08/20/17 1200      Date of Initial Exercise RX and Referring Provider   Date  08/20/17    Referring Provider  Adrian Prows MD       NuStep   Level  2    SPM  70    Minutes  20    METs  1.9      Arm Ergometer   Level  2    Watts  25    Minutes  10    METs  2.05      Prescription Details   Frequency (  times per week)  3x    Duration  Progress to 30 minutes of continuous aerobic without signs/symptoms of  physical distress      Intensity   THRR 40-80% of Max Heartrate  60-120    Ratings of Perceived Exertion  11-13    Perceived Dyspnea  0-4      Progression   Progression  Continue progressive overload as per policy without signs/symptoms or physical distress.      Resistance Training   Training Prescription  Yes    Weight  3lbs    Reps  10-15       Perform Capillary Blood Glucose checks as needed.  Exercise Prescription Changes: Exercise Prescription Changes    Row Name 08/26/17 0950             Response to Exercise   Blood Pressure (Admit)  148/80       Blood Pressure (Exercise)  142/84       Blood Pressure (Exit)  130/68       Heart Rate (Admit)  71 bpm       Heart Rate (Exercise)  83 bpm       Heart Rate (Exit)  68 bpm       Rating of Perceived Exertion (Exercise)  11       Symptoms  none       Duration  Progress to 30 minutes of  aerobic without signs/symptoms of physical distress       Intensity  THRR unchanged         Progression   Progression  Continue to progress workloads to maintain intensity without signs/symptoms of physical distress.       Average METs  2         Resistance Training   Training Prescription  No Relaxation day, no weights.         Interval Training   Interval Training  No         Bike   Level  1       Minutes  10       METs  1.7         NuStep   Level  2       SPM  85       Minutes  20       METs  2.3         Arm Ergometer   Level  -       Watts  -       Minutes  -       METs  -          Exercise Comments: Exercise Comments    Row Name 08/26/17 1413 08/31/17 1022         Exercise Comments  Reviewed goals with patient.  Reviewed home exercise guidelines and METs with patient.         Exercise Goals and Review: Exercise Goals    Row Name 08/20/17 1246             Exercise Goals   Increase Physical Activity  Yes       Intervention  Provide advice, education, support and counseling about physical  activity/exercise needs.;Develop an individualized exercise prescription for aerobic and resistive training based on initial evaluation findings, risk stratification, comorbidities and participant's personal goals.       Expected Outcomes  Short Term: Attend rehab on a regular basis to increase amount of physical activity.;Long Term: Add in home  exercise to make exercise part of routine and to increase amount of physical activity.;Long Term: Exercising regularly at least 3-5 days a week.       Increase Strength and Stamina  Yes       Intervention  Provide advice, education, support and counseling about physical activity/exercise needs.;Develop an individualized exercise prescription for aerobic and resistive training based on initial evaluation findings, risk stratification, comorbidities and participant's personal goals.       Expected Outcomes  Short Term: Increase workloads from initial exercise prescription for resistance, speed, and METs.;Short Term: Perform resistance training exercises routinely during rehab and add in resistance training at home;Long Term: Improve cardiorespiratory fitness, muscular endurance and strength as measured by increased METs and functional capacity (6MWT)       Able to understand and use rate of perceived exertion (RPE) scale  Yes       Intervention  Provide education and explanation on how to use RPE scale       Expected Outcomes  Short Term: Able to use RPE daily in rehab to express subjective intensity level;Long Term:  Able to use RPE to guide intensity level when exercising independently       Knowledge and understanding of Target Heart Rate Range (THRR)  Yes       Intervention  Provide education and explanation of THRR including how the numbers were predicted and where they are located for reference       Expected Outcomes  Short Term: Able to state/look up THRR;Short Term: Able to use daily as guideline for intensity in rehab;Long Term: Able to use THRR to govern  intensity when exercising independently       Able to check pulse independently  Yes       Intervention  Provide education and demonstration on how to check pulse in carotid and radial arteries.;Review the importance of being able to check your own pulse for safety during independent exercise       Expected Outcomes  Short Term: Able to explain why pulse checking is important during independent exercise;Long Term: Able to check pulse independently and accurately       Understanding of Exercise Prescription  Yes       Intervention  Provide education, explanation, and written materials on patient's individual exercise prescription       Expected Outcomes  Short Term: Able to explain program exercise prescription;Long Term: Able to explain home exercise prescription to exercise independently          Exercise Goals Re-Evaluation : Exercise Goals Re-Evaluation    Row Name 08/26/17 1410 08/31/17 1022           Exercise Goal Re-Evaluation   Exercise Goals Review  Able to understand and use rate of perceived exertion (RPE) scale;Increase Physical Activity  Understanding of Exercise Prescription      Comments  Patient completed 1st day of exercise at cardiac rehab and tolerated low intensity exercise well. Pt able to understand and use the RPE scale appropriately. Pt previously exercised at the gym and participated in the [employer] program. Pt's goal is to return to exercise at the gym.  Home exercise guidelines given. Patient states his goal is to return to exercise at [employer] or [employer] 5 days/week.      Expected Outcomes  Increase workloads as tolerated to help achieve personal health and fitness goals.   Patient will add 1-2 days aerobic exercise walking or at local gym in addition to exercise at  [facility].          Discharge Exercise Prescription (Final Exercise Prescription Changes): Exercise Prescription Changes - 08/26/17 0950      Response to Exercise   Blood Pressure (Admit)   148/80    Blood Pressure (Exercise)  142/84    Blood Pressure (Exit)  130/68    Heart Rate (Admit)  71 bpm    Heart Rate (Exercise)  83 bpm    Heart Rate (Exit)  68 bpm    Rating of Perceived Exertion (Exercise)  11    Symptoms  none    Duration  Progress to 30 minutes of  aerobic without signs/symptoms of physical distress    Intensity  THRR unchanged      Progression   Progression  Continue to progress workloads to maintain intensity without signs/symptoms of physical distress.    Average METs  2      Resistance Training   Training Prescription  No Relaxation day, no weights.      Interval Training   Interval Training  No      Bike   Level  1    Minutes  10    METs  1.7      NuStep   Level  2    SPM  85    Minutes  20    METs  2.3      Arm Ergometer   Level  --    Watts  --    Minutes  --    METs  --       Nutrition:  Target Goals: Understanding of nutrition guidelines, daily intake of sodium 1500mg , cholesterol 200mg , calories 30% from fat and 7% or less from saturated fats, daily to have 5 or more servings of fruits and vegetables.  Biometrics: Pre Biometrics - 08/20/17 1243      Pre Biometrics   Height  6' 0.25" (1.835 m)    Weight  232 lb 5.8 oz (105.4 kg)    Waist Circumference  47.5 inches    Hip Circumference  44.5 inches    Waist to Hip Ratio  1.07 %    BMI (Calculated)  31.3    Triceps Skinfold  10 mm    % Body Fat  30.4 %    Grip Strength  30 kg    Flexibility  0 in    Single Leg Stand  0 seconds        Nutrition Therapy Plan and Nutrition Goals: Nutrition Therapy & Goals - 08/20/17 1449      Nutrition Therapy   Diet  Carb Modified, Heart Healthy      Personal Nutrition Goals   Nutrition Goal  Pt to identify food quantities necessary to achieve weight loss of 6-24 lb (2.7-10.9 kg) at graduation from cardiac rehab. Goal wt of 180-190 lb desired.     Personal Goal #2  Pt to make good choices when eating out at restaurants.    Personal  Goal #3  Pt to choose healthy, balanced snacks      Intervention Plan   Intervention  Prescribe, educate and counsel regarding individualized specific dietary modifications aiming towards targeted core components such as weight, hypertension, lipid management, diabetes, heart failure and other comorbidities.    Expected Outcomes  Short Term Goal: Understand basic principles of dietary content, such as calories, fat, sodium, cholesterol and nutrients.;Long Term Goal: Adherence to prescribed nutrition plan.       Nutrition Assessments: Nutrition Assessments - 08/20/17 1449  MEDFICTS Scores   Pre Score  27       Nutrition Goals Re-Evaluation:   Nutrition Goals Re-Evaluation:   Nutrition Goals Discharge (Final Nutrition Goals Re-Evaluation):   Psychosocial: Target Goals: Acknowledge presence or absence of significant depression and/or stress, maximize coping skills, provide positive support system. Participant is able to verbalize types and ability to use techniques and skills needed for reducing stress and depression.  Initial Review & Psychosocial Screening: Initial Psych Review & Screening - 08/20/17 1254      Initial Review   Current issues with  None Identified      Family Dynamics   Good Support System?  Yes      Barriers   Psychosocial barriers to participate in program  There are no identifiable barriers or psychosocial needs.      Screening Interventions   Interventions  Encouraged to exercise       Quality of Life Scores: Quality of Life - 08/20/17 1226      Quality of Life Scores   Health/Function Pre  22.57 %    Socioeconomic Pre  26.43 %    Psych/Spiritual Pre  27.43 %    Family Pre  28.8 %    GLOBAL Pre  25.28 %      Scores of 19 and below usually indicate a poorer quality of life in these areas.  A difference of  2-3 points is a clinically meaningful difference.  A difference of 2-3 points in the total score of the Quality of Life Index has been  associated with significant improvement in overall quality of life, self-image, physical symptoms, and general health in studies assessing change in quality of life.  PHQ-9: Recent Review Flowsheet Data    Depression screen [facility] 2/9 08/24/2017 07/03/2014   Decreased Interest 0 1   Down, Depressed, Hopeless 0 1   PHQ - 2 Score 0 2   Change in appetite - 1   Feeling bad or failure about yourself  - 1   Moving slowly or fidgety/restless - 0     Interpretation of Total Score  Total Score Depression Severity:  1-4 = Minimal depression, 5-9 = Mild depression, 10-14 = Moderate depression, 15-19 = Moderately severe depression, 20-27 = Severe depression   Psychosocial Evaluation and Intervention:   Psychosocial Re-Evaluation: Psychosocial Re-Evaluation    [place] Name 09/01/17 1204             Psychosocial Re-Evaluation   Current issues with  None Identified       Interventions  Stress management education       Continue Psychosocial Services   No Follow up required          Psychosocial Discharge (Final Psychosocial Re-Evaluation): Psychosocial Re-Evaluation - 09/01/17 1204      Psychosocial Re-Evaluation   Current issues with  None Identified    Interventions  Stress management education    Continue Psychosocial Services   No Follow up required       Vocational Rehabilitation: Provide vocational rehab assistance to qualifying candidates.   Vocational Rehab Evaluation & Intervention: Vocational Rehab - 08/20/17 1306      Initial Vocational Rehab Evaluation & Intervention   Assessment shows need for Vocational Rehabilitation  No       Education: Education Goals: Education classes will be provided on a weekly basis, covering required topics. Participant will state understanding/return demonstration of topics presented.  Learning Barriers/Preferences: Learning Barriers/Preferences - 08/19/17 1041      Learning Barriers/Preferences  Learning Barriers  Sight    Learning  Preferences  Written Material       Education Topics: Count Your Pulse:  -Group instruction provided by verbal instruction, demonstration, patient participation and written materials to support subject.  Instructors address importance of being able to find your pulse and how to count your pulse when at home without a heart monitor.  Patients get hands on experience counting their pulse with staff help and individually.   Heart Attack, Angina, and Risk Factor Modification:  -Group instruction provided by verbal instruction, video, and written materials to support subject.  Instructors address signs and symptoms of angina and heart attacks.    Also discuss risk factors for heart disease and how to make changes to improve heart health risk factors.   Functional Fitness:  -Group instruction provided by verbal instruction, demonstration, patient participation, and written materials to support subject.  Instructors address safety measures for doing things around the house.  Discuss how to get up and down off the floor, how to pick things up properly, how to safely get out of a chair without assistance, and balance training.   Meditation and Mindfulness:  -Group instruction provided by verbal instruction, patient participation, and written materials to support subject.  Instructor addresses importance of mindfulness and meditation practice to help reduce stress and improve awareness.  Instructor also leads participants through a meditation exercise.    Stretching for Flexibility and Mobility:  -Group instruction provided by verbal instruction, patient participation, and written materials to support subject.  Instructors lead participants through series of stretches that are designed to increase flexibility thus improving mobility.  These stretches are additional exercise for major muscle groups that are typically performed during regular warm up and cool down.   Hands Only CPR:  -Group verbal,  video, and participation provides a basic overview of AHA guidelines for community CPR. Role-play of emergencies allow participants the opportunity to practice calling for help and chest compression technique with discussion of AED use.   Hypertension: -Group verbal and written instruction that provides a basic overview of hypertension including the most recent diagnostic guidelines, risk factor reduction with self-care instructions and medication management.    Nutrition I class: Heart Healthy Eating:  -Group instruction provided by PowerPoint slides, verbal discussion, and written materials to support subject matter. The instructor gives an explanation and review of the Therapeutic Lifestyle Changes diet recommendations, which includes a discussion on lipid goals, dietary fat, sodium, fiber, plant stanol/sterol esters, sugar, and the components of a well-balanced, healthy diet.   Nutrition II class: Lifestyle Skills:  -Group instruction provided by PowerPoint slides, verbal discussion, and written materials to support subject matter. The instructor gives an explanation and review of label reading, grocery shopping for heart health, heart healthy recipe modifications, and ways to make healthier choices when eating out.   Diabetes Question & Answer:  -Group instruction provided by PowerPoint slides, verbal discussion, and written materials to support subject matter. The instructor gives an explanation and review of diabetes co-morbidities, pre- and post-prandial blood glucose goals, pre-exercise blood glucose goals, signs, symptoms, and treatment of hypoglycemia and hyperglycemia, and foot care basics.   Diabetes Blitz:  -Group instruction provided by PowerPoint slides, verbal discussion, and written materials to support subject matter. The instructor gives an explanation and review of the physiology behind type 1 and type 2 diabetes, diabetes medications and rational behind using different  medications, pre- and post-prandial blood glucose recommendations and Hemoglobin A1c goals, diabetes diet, and exercise  including blood glucose guidelines for exercising safely.    Portion Distortion:  -Group instruction provided by PowerPoint slides, verbal discussion, written materials, and food models to support subject matter. The instructor gives an explanation of serving size versus portion size, changes in portions sizes over the last 20 years, and what consists of a serving from each food group.   Stress Management:  -Group instruction provided by verbal instruction, video, and written materials to support subject matter.  Instructors review role of stress in heart disease and how to cope with stress positively.     Exercising on Your Own:  -Group instruction provided by verbal instruction, power point, and written materials to support subject.  Instructors discuss benefits of exercise, components of exercise, frequency and intensity of exercise, and end points for exercise.  Also discuss use of nitroglycerin and activating EMS.  Review options of places to exercise outside of rehab.  Review guidelines for sex with heart disease.   Cardiac Drugs I:  -Group instruction provided by verbal instruction and written materials to support subject.  Instructor reviews cardiac drug classes: antiplatelets, anticoagulants, beta blockers, and statins.  Instructor discusses reasons, side effects, and lifestyle considerations for each drug class.   Cardiac Drugs II:  -Group instruction provided by verbal instruction and written materials to support subject.  Instructor reviews cardiac drug classes: angiotensin converting enzyme inhibitors (ACE-I), angiotensin II receptor blockers (ARBs), nitrates, and calcium channel blockers.  Instructor discusses reasons, side effects, and lifestyle considerations for each drug class.   Anatomy and Physiology of the Circulatory System:  Group verbal and written  instruction and models provide basic cardiac anatomy and physiology, with the coronary electrical and arterial systems. Review of: AMI, Angina, Valve disease, Heart Failure, Peripheral Artery Disease, Cardiac Arrhythmia, Pacemakers, and the ICD.   Other Education:  -Group or individual verbal, written, or video instructions that support the educational goals of the cardiac rehab program.   Holiday Eating Survival Tips:  -Group instruction provided by PowerPoint slides, verbal discussion, and written materials to support subject matter. The instructor gives patients tips, tricks, and techniques to help them not only survive but enjoy the holidays despite the onslaught of food that accompanies the holidays.   Knowledge Questionnaire Score: Knowledge Questionnaire Score - 08/20/17 1226      Knowledge Questionnaire Score   Pre Score  18/24       Core Components/Risk Factors/Patient Goals at Admission: Personal Goals and Risk Factors at Admission - 08/20/17 1236      Core Components/Risk Factors/Patient Goals on Admission    Weight Management  Yes    Intervention  Weight Management: Develop a combined nutrition and exercise program designed to reach desired caloric intake, while maintaining appropriate intake of nutrient and fiber, sodium and fats, and appropriate energy expenditure required for the weight goal.;Weight Management: Provide education and appropriate resources to help participant work on and attain dietary goals.;Weight Management/Obesity: Establish reasonable short term and long term weight goals.    Admit Weight  232 lb 5.8 oz (105.4 kg)    Goal Weight: Short Term  225 lb (102.1 kg)    Goal Weight: Long Term  220 lb (99.8 kg)    Expected Outcomes  Short Term: Continue to assess and modify interventions until short term weight is achieved;Long Term: Adherence to nutrition and physical activity/exercise program aimed toward attainment of established weight goal;Weight Loss:  Understanding of general recommendations for a balanced deficit meal plan, which promotes 1-2 lb weight loss per week  and includes a negative energy balance of [phone] kcal/d;Understanding recommendations for meals to include 15-35% energy as protein, 25-35% energy from fat, 35-60% energy from carbohydrates, less than 200mg  of dietary cholesterol, 20-35 gm of total fiber daily;Understanding of distribution of calorie intake throughout the day with the consumption of 4-5 meals/snacks    Diabetes  Yes    Intervention  Provide education about signs/symptoms and action to take for hypo/hyperglycemia.;Provide education about proper nutrition, including hydration, and aerobic/resistive exercise prescription along with prescribed medications to achieve blood glucose in normal ranges: Fasting glucose 65-99 mg/dL    Expected Outcomes  Short Term: Participant verbalizes understanding of the signs/symptoms and immediate care of hyper/hypoglycemia, proper foot care and importance of medication, aerobic/resistive exercise and nutrition plan for blood glucose control.;Long Term: Attainment of HbA1C < 7%.    Hypertension  Yes    Intervention  Provide education on lifestyle modifcations including regular physical activity/exercise, weight management, moderate sodium restriction and increased consumption of fresh fruit, vegetables, and low fat dairy, alcohol moderation, and smoking cessation.;Monitor prescription use compliance.    Expected Outcomes  Short Term: Continued assessment and intervention until BP is < 140/74mm HG in hypertensive participants. < 130/70mm HG in hypertensive participants with diabetes, heart failure or chronic kidney disease.;Long Term: Maintenance of blood pressure at goal levels.    Lipids  Yes    Intervention  Provide education and support for participant on nutrition & aerobic/resistive exercise along with prescribed medications to achieve LDL 70mg , HDL >40mg .    Expected Outcomes  Short  Term: Participant states understanding of desired cholesterol values and is compliant with medications prescribed. Participant is following exercise prescription and nutrition guidelines.;Long Term: Cholesterol controlled with medications as prescribed, with individualized exercise RX and with personalized nutrition plan. Value goals: LDL < 70mg , HDL > 40 mg.       Core Components/Risk Factors/Patient Goals Review:  Goals and Risk Factor Review    Row Name 09/01/17 1200             Core Components/Risk Factors/Patient Goals Review   Personal Goals Review  Weight Management/Obesity;Lipids;Hypertension;Diabetes       Review  Patient off to a good start to exercise.        Expected Outcomes  Patient will continue to take his medications as presribed and come to cardiac rehab for exercise          Core Components/Risk Factors/Patient Goals at Discharge (Final Review):  Goals and Risk Factor Review - 09/01/17 1200      Core Components/Risk Factors/Patient Goals Review   Personal Goals Review  Weight Management/Obesity;Lipids;Hypertension;Diabetes    Review  Patient off to a good start to exercise.     Expected Outcomes  Patient will continue to take his medications as presribed and come to cardiac rehab for exercise       ITP Comments: ITP Comments    Row Name 08/20/17 9767 09/01/17 1158         ITP Comments  Dr. Fransico Him, Medical Director  30 day ITP review. Patient off to a good start to exercise. will continue to monitor CBG's         Comments: See ITP comments.Barnet Pall, RN,BSN 09/01/2017 12:06 PM

## 2017-09-02 ENCOUNTER — Ambulatory Visit (HOSPITAL_COMMUNITY): Payer: Medicare Other

## 2017-09-02 ENCOUNTER — Encounter (HOSPITAL_COMMUNITY)
Admission: RE | Admit: 2017-09-02 | Discharge: 2017-09-02 | Disposition: A | Discharge status: Home / Self Care | Payer: Medicare Other | Source: Ambulatory Visit | Attending: Cardiology | Admitting: Cardiology

## 2017-09-02 ENCOUNTER — Encounter (HOSPITAL_COMMUNITY): Payer: Medicare Other

## 2017-09-02 DIAGNOSIS — Z955 Presence of coronary angioplasty implant and graft: Secondary | ICD-10-CM | POA: Diagnosis not present

## 2017-09-02 LAB — GLUCOSE, CAPILLARY
GLUCOSE-CAPILLARY: 122 mg/dL — AB (ref 65–99)
GLUCOSE-CAPILLARY: 107 mg/dL — AB (ref 65–99)

## 2017-09-02 NOTE — Progress Notes (Signed)
David [name] 72 y.o. male DOB: 03-Sep-1945 MRN: [id number]      Nutrition Note  1. Status post coronary artery stent placement 04/06/17 S/P Stent Distal LAD, 04/07/17 S/P Stent Mid LAD    Meds reviewed. Humalog, Levemir noted  Labs:   Lab Results  Component Value Date   HGBA1C 7.2 (H) 04/01/2017   CBG (last 3)  Recent Labs    08/31/17 1049 08/31/17 1105 09/02/17 0956  GLUCAP 88 102* 122*   Wt Readings from Last 5 Encounters:  08/20/17 232 lb 5.8 oz (105.4 kg)  06/10/17 225 lb (102.1 kg)  04/09/17 225 lb (102.1 kg)  09/08/16 227 lb 3.2 oz (103.1 kg)  07/28/16 226 lb (102.5 kg)   Nutrition Note Spoke with pt. Nutrition plan and survey reviewed with pt. Pt is following a heart healthy diet. Pt wants to lose wt. Pt wt today reportedly 107.3 kg (236 lb).  Pt has gained 4 lb (1.7% increase) over less than 2 weeks. Pt reports he stopped taking Lasix. Pt's wt gain and lasix use discussed with David Pall, RN.  Pt is diabetic. Pt states he checks CBG's 2 times a day  "but does not like needs or checking his blood sugar."  Pt CBG's dropping significantly with exercise since pt started taking Humalog. Per discussion, pt ate an egg McMuffin apples and a banana and black coffee around 9 am. Pre-exercise CBG WNL.Pt expressed understanding of the information reviewed. Pt aware of nutrition education classes offered and plans on attending nutrition classes.  Nutrition Diagnosis ? Food-and nutrition-related knowledge deficit related to lack of exposure to information as related to diagnosis of: ? CVD ? DM  ? Overweight related to excessive energy intake as evidenced by a BMI of 31.3  Nutrition Intervention ? Pt's individual nutrition plan reviewed with pt. ? Benefits of adopting Heart Healthy diet discussed when [employer] reviewed.   ? Pt to hold Humalog before exercise Friday 09/04/17 to try and avoid having to receive a snack.  ? Pt given handouts for: ? Nutrition I class ? Nutrition II  class ? Diabetes Blitz Class   Nutrition Goal(s):  ? Pt to identify food quantities necessary to achieve weight loss of 6-24 lb (2.7-10.9 kg) at graduation from cardiac rehab. Goal wt of 180-190 lb desired.  ? Pt to make good choices when eating out at restaurants. ? Pt to choose healthy, balanced snacks Plan:  Pt to attend nutrition classes ? Nutrition I ? Nutrition II ? Portion Distortion ? Diabetes Blitz ? Diabetes Q & A Will provide client-centered nutrition education as part of interdisciplinary care.   Monitor and evaluate progress toward nutrition goal with team.  Derek Mound, David [name], David [name], David [name], David [name] 09/02/2017 10:32 AM

## 2017-09-04 ENCOUNTER — Ambulatory Visit (HOSPITAL_COMMUNITY): Payer: Medicare Other

## 2017-09-04 ENCOUNTER — Encounter (HOSPITAL_COMMUNITY)
Admission: RE | Admit: 2017-09-04 | Discharge: 2017-09-04 | Disposition: A | Discharge status: Home / Self Care | Payer: Medicare Other | Source: Ambulatory Visit | Attending: Cardiology | Admitting: Cardiology

## 2017-09-04 ENCOUNTER — Encounter (HOSPITAL_COMMUNITY): Payer: Medicare Other

## 2017-09-04 DIAGNOSIS — Z955 Presence of coronary angioplasty implant and graft: Secondary | ICD-10-CM

## 2017-09-04 LAB — GLUCOSE, CAPILLARY
Glucose-Capillary: 187 mg/dL — ABNORMAL HIGH (ref 65–99)
Glucose-Capillary: 159 mg/dL — ABNORMAL HIGH (ref 65–99)

## 2017-09-07 ENCOUNTER — Ambulatory Visit (HOSPITAL_COMMUNITY): Payer: Medicare Other

## 2017-09-07 ENCOUNTER — Encounter (HOSPITAL_COMMUNITY): Payer: Medicare Other

## 2017-09-07 ENCOUNTER — Encounter (HOSPITAL_COMMUNITY)
Admission: RE | Admit: 2017-09-07 | Discharge: 2017-09-07 | Disposition: A | Discharge status: Home / Self Care | Payer: Medicare Other | Source: Ambulatory Visit | Attending: Cardiology | Admitting: Cardiology

## 2017-09-07 DIAGNOSIS — Z955 Presence of coronary angioplasty implant and graft: Secondary | ICD-10-CM | POA: Diagnosis not present

## 2017-09-07 LAB — GLUCOSE, CAPILLARY
GLUCOSE-CAPILLARY: 179 mg/dL — AB (ref 65–99)
Glucose-Capillary: 147 mg/dL — ABNORMAL HIGH (ref 65–99)

## 2017-09-08 ENCOUNTER — Ambulatory Visit (INDEPENDENT_AMBULATORY_CARE_PROVIDER_SITE_OTHER): Payer: Medicare Other | Admitting: Orthopedic Surgery

## 2017-09-09 ENCOUNTER — Ambulatory Visit (HOSPITAL_COMMUNITY): Payer: Medicare Other

## 2017-09-09 ENCOUNTER — Encounter (HOSPITAL_COMMUNITY): Payer: Medicare Other

## 2017-09-09 ENCOUNTER — Encounter (HOSPITAL_COMMUNITY)
Admission: RE | Admit: 2017-09-09 | Discharge: 2017-09-09 | Disposition: A | Discharge status: Home / Self Care | Payer: Medicare Other | Source: Ambulatory Visit | Attending: Cardiology | Admitting: Cardiology

## 2017-09-09 DIAGNOSIS — Z955 Presence of coronary angioplasty implant and graft: Secondary | ICD-10-CM | POA: Diagnosis not present

## 2017-09-09 LAB — GLUCOSE, CAPILLARY
Glucose-Capillary: 107 mg/dL — ABNORMAL HIGH (ref 65–99)
Glucose-Capillary: 98 mg/dL (ref 65–99)

## 2017-09-11 ENCOUNTER — Encounter (HOSPITAL_COMMUNITY)
Admission: RE | Admit: 2017-09-11 | Discharge: 2017-09-11 | Disposition: A | Discharge status: Home / Self Care | Payer: Medicare Other | Source: Ambulatory Visit | Attending: Cardiology | Admitting: Cardiology

## 2017-09-11 ENCOUNTER — Ambulatory Visit (HOSPITAL_COMMUNITY): Payer: Medicare Other

## 2017-09-11 ENCOUNTER — Encounter (HOSPITAL_COMMUNITY): Payer: Medicare Other

## 2017-09-11 DIAGNOSIS — Z955 Presence of coronary angioplasty implant and graft: Secondary | ICD-10-CM | POA: Diagnosis not present

## 2017-09-11 LAB — GLUCOSE, CAPILLARY: Glucose-Capillary: 163 mg/dL — ABNORMAL HIGH (ref 65–99)

## 2017-09-14 ENCOUNTER — Encounter (HOSPITAL_COMMUNITY)
Admission: RE | Admit: 2017-09-14 | Discharge: 2017-09-14 | Disposition: A | Discharge status: Home / Self Care | Payer: Medicare Other | Source: Ambulatory Visit | Attending: Cardiology | Admitting: Cardiology

## 2017-09-14 ENCOUNTER — Ambulatory Visit (HOSPITAL_COMMUNITY): Payer: Medicare Other

## 2017-09-14 ENCOUNTER — Encounter (HOSPITAL_COMMUNITY): Payer: Medicare Other

## 2017-09-14 DIAGNOSIS — Z955 Presence of coronary angioplasty implant and graft: Secondary | ICD-10-CM

## 2017-09-14 LAB — GLUCOSE, CAPILLARY: GLUCOSE-CAPILLARY: 122 mg/dL — AB (ref 65–99)

## 2017-09-16 ENCOUNTER — Ambulatory Visit (HOSPITAL_COMMUNITY): Payer: Medicare Other

## 2017-09-16 ENCOUNTER — Encounter (HOSPITAL_COMMUNITY): Payer: Medicare Other

## 2017-09-18 ENCOUNTER — Encounter (HOSPITAL_COMMUNITY): Payer: Medicare Other

## 2017-09-18 ENCOUNTER — Ambulatory Visit (HOSPITAL_COMMUNITY): Payer: Medicare Other

## 2017-09-21 ENCOUNTER — Encounter (HOSPITAL_COMMUNITY): Payer: Medicare Other

## 2017-09-21 ENCOUNTER — Ambulatory Visit (HOSPITAL_COMMUNITY): Payer: Medicare Other

## 2017-09-23 ENCOUNTER — Ambulatory Visit (HOSPITAL_COMMUNITY): Payer: Medicare Other

## 2017-09-23 ENCOUNTER — Encounter (HOSPITAL_COMMUNITY): Payer: Medicare Other

## 2017-09-25 ENCOUNTER — Encounter (HOSPITAL_COMMUNITY): Payer: Medicare Other

## 2017-09-25 ENCOUNTER — Ambulatory Visit (HOSPITAL_COMMUNITY): Payer: Medicare Other

## 2017-09-28 ENCOUNTER — Encounter (HOSPITAL_COMMUNITY): Payer: Medicare Other

## 2017-09-28 ENCOUNTER — Telehealth (HOSPITAL_COMMUNITY): Payer: Self-pay | Admitting: *Deleted

## 2017-09-30 ENCOUNTER — Encounter (HOSPITAL_COMMUNITY): Payer: Medicare Other

## 2017-09-30 ENCOUNTER — Encounter (HOSPITAL_COMMUNITY): Payer: Self-pay | Admitting: *Deleted

## 2017-09-30 DIAGNOSIS — Z955 Presence of coronary angioplasty implant and graft: Secondary | ICD-10-CM

## 2017-09-30 NOTE — Progress Notes (Signed)
Cardiac Individual Treatment Plan  Patient Details  Name: David [name] MRN: [id number] Date of Birth: 28-Apr-1946 Referring Provider:     CARDIAC REHAB PHASE II ORIENTATION from 08/20/2017 in [place]  Referring Provider  Adrian Prows MD       Initial Encounter Date:    CARDIAC REHAB PHASE II ORIENTATION from 08/20/2017 in [place]  Date  08/20/17  Referring Provider  Adrian Prows MD       Visit Diagnosis: Status post coronary artery stent placement 04/06/17 S/P Stent Distal LAD, 04/07/17 S/P Stent Mid LAD  Patient's Home Medications on Admission:  Current Outpatient Medications:  .  albuterol (PROVENTIL HFA;VENTOLIN HFA) 108 (90 Base) MCG/ACT inhaler, Inhale 2 puffs into the lungs every 6 (six) hours as needed for wheezing or shortness of breath., Disp: 1 Inhaler, Rfl: 2 .  ALPRAZolam (XANAX) 1 MG tablet, TAKE 1 TABLET BY MOUTH UP TO TWICE DAILY AS NEEDED FOR ANXEITY, Disp: , Rfl: 2 .  amLODipine (NORVASC) 5 MG tablet, Take 1 tablet (5 mg total) by mouth daily., Disp: 30 tablet, Rfl: 1 .  aspirin EC 81 MG tablet, Take 81 mg by mouth daily., Disp: , Rfl:  .  atorvastatin (LIPITOR) 40 MG tablet, Take 40 mg by mouth at bedtime., Disp: , Rfl: 11 .  clopidogrel (PLAVIX) 75 MG tablet, Take 1 tablet (75 mg total) by mouth daily., Disp: 30 tablet, Rfl: 1 .  docusate sodium (COLACE) 100 MG capsule, Take 100 mg by mouth daily., Disp: , Rfl:  .  fluticasone (FLONASE) 50 MCG/ACT nasal spray, Place 1 spray into both nostrils daily., Disp: 16 g, Rfl: 1 .  furosemide (LASIX) 20 MG tablet, Take 1 tablet (20 mg total) by mouth daily. Take twice daily for 5 days, and then daily after that, Disp: 30 tablet, Rfl: 1 .  gabapentin (NEURONTIN) 600 MG tablet, Take 600 mg by mouth 2 (two) times daily. , Disp: , Rfl:  .  HYDROcodone-acetaminophen (NORCO) 10-325 MG tablet, Take 1 tablet by mouth 2 (two) times daily as needed for moderate pain. ,  Disp: , Rfl: 0 .  Insulin Detemir (LEVEMIR FLEXTOUCH) 100 UNIT/ML Pen, Inject 40 Units into the skin daily. (Patient taking differently: Inject 42 Units into the skin daily. ), Disp: 3 mL, Rfl: 1 .  iron polysaccharides (NIFEREX) 150 MG capsule, Take 1 capsule (150 mg total) by mouth daily., Disp: 30 capsule, Rfl: 1 .  isosorbide-hydrALAZINE (BIDIL) 20-37.5 MG tablet, Take 1 tablet by mouth 3 (three) times daily., Disp: 90 tablet, Rfl: 0 .  LUMIGAN 0.01 % SOLN, Place 1 drop into both eyes at bedtime., Disp: , Rfl: 99 .  methocarbamol (ROBAXIN) 750 MG tablet, Take 1 tablet (750 mg total) by mouth every 6 (six) hours as needed for muscle spasms., Disp: 30 tablet, Rfl: 0 .  metoprolol succinate (TOPROL-XL) 100 MG 24 hr tablet, Take 1 tablet (100 mg total) by mouth daily. Take with or immediately following a meal., Disp: 30 tablet, Rfl: 1  Past Medical History: Past Medical History:  Diagnosis Date  . Arthritis   . CHF (congestive heart failure) ([place])   . DDD (degenerative disc disease), cervical   . Diabetes mellitus without complication ([place])    type II  . Diabetic neuropathy ([place])   . GERD (gastroesophageal reflux disease)   . Glaucoma   . Hypertension   . Renal disorder    Stage II    Tobacco Use:  Social History   Tobacco Use  Smoking Status Never Smoker  Smokeless Tobacco Never Used    Labs: Recent Review Flowsheet Data    Labs for ITP Cardiac and Pulmonary Rehab Latest Ref Rng & Units 08/14/2013 03/29/2017 04/01/2017 04/06/2017 04/06/2017   Hemoglobin A1c 4.8 - 5.6 % 7.7(H) - 7.2(H) - -   PHART 7.350 - 7.450 - 7.388 - - 7.403   PCO2ART 32.0 - 48.0 mmHg - 46.1 - - 51.4(H)   HCO3 20.0 - 28.0 mmol/L - 27.7 - 32.7(H) 32.0(H)   TCO2 22 - 32 mmol/L - 29 - 34(H) 34(H)   O2SAT % - 99.0 - 67.0 99.0      Capillary Blood Glucose: Lab Results  Component Value Date   GLUCAP 122 (H) 09/14/2017   GLUCAP 163 (H) 09/11/2017   GLUCAP 98 09/09/2017   GLUCAP 107 (H) 09/09/2017    GLUCAP 147 (H) 09/07/2017     Exercise Target Goals:    Exercise Program Goal: Individual exercise prescription set using results from initial 6 min walk test and THRR while considering  patient's activity barriers and safety.   Exercise Prescription Goal: Initial exercise prescription builds to 30-45 minutes a day of aerobic activity, 2-3 days per week.  Home exercise guidelines will be given to patient during program as part of exercise prescription that the participant will acknowledge.  Activity Barriers & Risk Stratification: Activity Barriers & Cardiac Risk Stratification - 08/20/17 1300      Activity Barriers & Cardiac Risk Stratification   Comments  Right Artificial Hip,  history Left Ankle Broken: Occasional Pain, Amputated Toe        6 Minute Walk: 6 Minute Walk    Row Name 08/20/17 1242         6 Minute Walk   Phase  Initial     Distance  900 feet     Walk Time  6 minutes     # of Rest Breaks  0     MPH  1.7     METS  2.1     RPE  11     Perceived Dyspnea   0     VO2 Peak  7.4     Symptoms  Yes (comment)     Comments  Pain in Left Hip and Lower Back 4/10     Resting HR  71 bpm     Resting BP  136/64     Resting Oxygen Saturation   98 %     Exercise Oxygen Saturation  during 6 min walk  95 %     Max Ex. HR  91 bpm     Max Ex. BP  160/76     2 Minute Post BP  128/60        Oxygen Initial Assessment:   Oxygen Re-Evaluation:   Oxygen Discharge (Final Oxygen Re-Evaluation):   Initial Exercise Prescription: Initial Exercise Prescription - 08/20/17 1200      Date of Initial Exercise RX and Referring Provider   Date  08/20/17    Referring Provider  Adrian Prows MD       NuStep   Level  2    SPM  70    Minutes  20    METs  1.9      Arm Ergometer   Level  2    Watts  25    Minutes  10    METs  2.05      Prescription Details   Frequency (  times per week)  3x    Duration  Progress to 30 minutes of continuous aerobic without signs/symptoms of  physical distress      Intensity   THRR 40-80% of Max Heartrate  60-120    Ratings of Perceived Exertion  11-13    Perceived Dyspnea  0-4      Progression   Progression  Continue progressive overload as per policy without signs/symptoms or physical distress.      Resistance Training   Training Prescription  Yes    Weight  3lbs    Reps  10-15       Perform Capillary Blood Glucose checks as needed.  Exercise Prescription Changes:  Exercise Prescription Changes    Row Name 08/26/17 0950 09/07/17 0954 09/14/17 0949         Response to Exercise   Blood Pressure (Admit)  148/80  154/80  136/70     Blood Pressure (Exercise)  142/84  148/70  146/70     Blood Pressure (Exit)  130/68  128/72  122/70     Heart Rate (Admit)  71 bpm  85 bpm  69 bpm     Heart Rate (Exercise)  83 bpm  96 bpm  77 bpm     Heart Rate (Exit)  68 bpm  75 bpm  65 bpm     Rating of Perceived Exertion (Exercise)  11  11  11      Symptoms  none  none  none     Duration  Progress to 30 minutes of  aerobic without signs/symptoms of physical distress  Progress to 30 minutes of  aerobic without signs/symptoms of physical distress  Progress to 30 minutes of  aerobic without signs/symptoms of physical distress     Intensity  THRR unchanged  THRR unchanged  THRR unchanged       Progression   Progression  Continue to progress workloads to maintain intensity without signs/symptoms of physical distress.  Continue to progress workloads to maintain intensity without signs/symptoms of physical distress.  Continue to progress workloads to maintain intensity without signs/symptoms of physical distress.     Average METs  2  2.7  2.2       Resistance Training   Training Prescription  No Relaxation day, no weights.  No  No       Interval Training   Interval Training  No  No  No       Bike   Level  1  -  -     Minutes  10  -  -     METs  1.7  -  -       NuStep   Level  2  2  3      SPM  85  85  85     Minutes  20  20  20       METs  2.3  2.7  2.5       Arm Ergometer   Level  -  4  4     Watts  -  -  -     Minutes  -  10  10     METs  -  -  1.9       Home Exercise Plan   Plans to continue exercise at  -  [employer] (comment)  [employer] (comment)     Frequency  -  Add 2 additional days to program exercise sessions.  Add 2 additional days to program  exercise sessions.     Initial Home Exercises Provided  -  08/31/17  08/31/17        Exercise Comments:  Exercise Comments    Row Name 08/26/17 1413 08/31/17 1022 09/07/17 0954 09/30/17 1150     Exercise Comments  Reviewed goals with patient.  Reviewed home exercise guidelines and METs with patient.  Reviewed METs with patient.  Patient out due to onset of heart failure this past weekend. Pt will be out all this week.       Exercise Goals and Review:  Exercise Goals    Row Name 08/20/17 1246 09/30/17 1149           Exercise Goals   Increase Physical Activity  Yes  Yes      Intervention  Provide advice, education, support and counseling about physical activity/exercise needs.;Develop an individualized exercise prescription for aerobic and resistive training based on initial evaluation findings, risk stratification, comorbidities and participant's personal goals.  Provide advice, education, support and counseling about physical activity/exercise needs.;Develop an individualized exercise prescription for aerobic and resistive training based on initial evaluation findings, risk stratification, comorbidities and participant's personal goals.      Expected Outcomes  Short Term: Attend rehab on a regular basis to increase amount of physical activity.;Long Term: Add in home exercise to make exercise part of routine and to increase amount of physical activity.;Long Term: Exercising regularly at least 3-5 days a week.  Short Term: Attend rehab on a regular basis to increase amount of physical activity.;Long Term: Add in home exercise to make exercise  part of routine and to increase amount of physical activity.;Long Term: Exercising regularly at least 3-5 days a week.      Increase Strength and Stamina  Yes  Yes      Intervention  Provide advice, education, support and counseling about physical activity/exercise needs.;Develop an individualized exercise prescription for aerobic and resistive training based on initial evaluation findings, risk stratification, comorbidities and participant's personal goals.  Provide advice, education, support and counseling about physical activity/exercise needs.;Develop an individualized exercise prescription for aerobic and resistive training based on initial evaluation findings, risk stratification, comorbidities and participant's personal goals.      Expected Outcomes  Short Term: Increase workloads from initial exercise prescription for resistance, speed, and METs.;Short Term: Perform resistance training exercises routinely during rehab and add in resistance training at home;Long Term: Improve cardiorespiratory fitness, muscular endurance and strength as measured by increased METs and functional capacity (6MWT)  Short Term: Increase workloads from initial exercise prescription for resistance, speed, and METs.;Short Term: Perform resistance training exercises routinely during rehab and add in resistance training at home;Long Term: Improve cardiorespiratory fitness, muscular endurance and strength as measured by increased METs and functional capacity (6MWT)      Able to understand and use rate of perceived exertion (RPE) scale  Yes  Yes      Intervention  Provide education and explanation on how to use RPE scale  Provide education and explanation on how to use RPE scale      Expected Outcomes  Short Term: Able to use RPE daily in rehab to express subjective intensity level;Long Term:  Able to use RPE to guide intensity level when exercising independently  Short Term: Able to use RPE daily in rehab to express subjective  intensity level;Long Term:  Able to use RPE to guide intensity level when exercising independently      Knowledge and understanding of Target Heart Rate Range (THRR)  Yes  Yes      Intervention  Provide education and explanation of THRR including how the numbers were predicted and where they are located for reference  Provide education and explanation of THRR including how the numbers were predicted and where they are located for reference      Expected Outcomes  Short Term: Able to state/look up THRR;Short Term: Able to use daily as guideline for intensity in rehab;Long Term: Able to use THRR to govern intensity when exercising independently  Short Term: Able to state/look up THRR;Short Term: Able to use daily as guideline for intensity in rehab;Long Term: Able to use THRR to govern intensity when exercising independently      Able to check pulse independently  Yes  Yes      Intervention  Provide education and demonstration on how to check pulse in carotid and radial arteries.;Review the importance of being able to check your own pulse for safety during independent exercise  Provide education and demonstration on how to check pulse in carotid and radial arteries.;Review the importance of being able to check your own pulse for safety during independent exercise      Expected Outcomes  Short Term: Able to explain why pulse checking is important during independent exercise;Long Term: Able to check pulse independently and accurately  Short Term: Able to explain why pulse checking is important during independent exercise;Long Term: Able to check pulse independently and accurately      Understanding of Exercise Prescription  Yes  Yes      Intervention  Provide education, explanation, and written materials on patient's individual exercise prescription  Provide education, explanation, and written materials on patient's individual exercise prescription      Expected Outcomes  Short Term: Able to explain program  exercise prescription;Long Term: Able to explain home exercise prescription to exercise independently  Short Term: Able to explain program exercise prescription;Long Term: Able to explain home exercise prescription to exercise independently         Exercise Goals Re-Evaluation : Exercise Goals Re-Evaluation    Row Name 08/26/17 1410 08/31/17 1022 09/30/17 1149         Exercise Goal Re-Evaluation   Exercise Goals Review  Able to understand and use rate of perceived exertion (RPE) scale;Increase Physical Activity  Understanding of Exercise Prescription  -     Comments  Patient completed 1st day of exercise at cardiac rehab and tolerated low intensity exercise well. Pt able to understand and use the RPE scale appropriately. Pt previously exercised at the gym and participated in the [employer] program. Pt's goal is to return to exercise at the gym.  Home exercise guidelines given. Patient states his goal is to return to exercise at [employer] or [employer] 5 days/week.  Patient developed heart failure over the weekend and will be out all week. Unable to review goals at this time.      Expected Outcomes  Increase workloads as tolerated to help achieve personal health and fitness goals.   Patient will add 1-2 days aerobic exercise walking or at local gym in addition to exercise at [facility].  -         Discharge Exercise Prescription (Final Exercise Prescription Changes): Exercise Prescription Changes - 09/14/17 0949      Response to Exercise   Blood Pressure (Admit)  136/70    Blood Pressure (Exercise)  146/70    Blood Pressure (Exit)  122/70    Heart Rate (Admit)  69 bpm  Heart Rate (Exercise)  77 bpm    Heart Rate (Exit)  65 bpm    Rating of Perceived Exertion (Exercise)  11    Symptoms  none    Duration  Progress to 30 minutes of  aerobic without signs/symptoms of physical distress    Intensity  THRR unchanged      Progression   Progression  Continue to progress workloads to  maintain intensity without signs/symptoms of physical distress.    Average METs  2.2      Resistance Training   Training Prescription  No      Interval Training   Interval Training  No      NuStep   Level  3    SPM  85    Minutes  20    METs  2.5      Arm Ergometer   Level  4    Minutes  10    METs  1.9      Home Exercise Plan   Plans to continue exercise at  [facility] (comment)    Frequency  Add 2 additional days to program exercise sessions.    Initial Home Exercises Provided  08/31/17       Nutrition:  Target Goals: Understanding of nutrition guidelines, daily intake of sodium 1500mg , cholesterol 200mg , calories 30% from fat and 7% or less from saturated fats, daily to have 5 or more servings of fruits and vegetables.  Biometrics: Pre Biometrics - 08/20/17 1243      Pre Biometrics   Height  6' 0.25" (1.835 m)    Weight  232 lb 5.8 oz (105.4 kg)    Waist Circumference  47.5 inches    Hip Circumference  44.5 inches    Waist to Hip Ratio  1.07 %    BMI (Calculated)  31.3    Triceps Skinfold  10 mm    % Body Fat  30.4 %    Grip Strength  30 kg    Flexibility  0 in    Single Leg Stand  0 seconds        Nutrition Therapy Plan and Nutrition Goals: Nutrition Therapy & Goals - 08/20/17 1449      Nutrition Therapy   Diet  Carb Modified, Heart Healthy      Personal Nutrition Goals   Nutrition Goal  Pt to identify food quantities necessary to achieve weight loss of 6-24 lb (2.7-10.9 kg) at graduation from cardiac rehab. Goal wt of 180-190 lb desired.     Personal Goal #2  Pt to make good choices when eating out at restaurants.    Personal Goal #3  Pt to choose healthy, balanced snacks      Intervention Plan   Intervention  Prescribe, educate and counsel regarding individualized specific dietary modifications aiming towards targeted core components such as weight, hypertension, lipid management, diabetes, heart failure and other comorbidities.     Expected Outcomes  Short Term Goal: Understand basic principles of dietary content, such as calories, fat, sodium, cholesterol and nutrients.;Long Term Goal: Adherence to prescribed nutrition plan.       Nutrition Assessments: Nutrition Assessments - 08/20/17 1449      MEDFICTS Scores   Pre Score  27       Nutrition Goals Re-Evaluation:   Nutrition Goals Re-Evaluation:   Nutrition Goals Discharge (Final Nutrition Goals Re-Evaluation):   Psychosocial: Target Goals: Acknowledge presence or absence of significant depression and/or stress, maximize coping skills, provide positive support system. Participant is  able to verbalize types and ability to use techniques and skills needed for reducing stress and depression.  Initial Review & Psychosocial Screening: Initial Psych Review & Screening - 08/20/17 1254      Initial Review   Current issues with  None Identified      Family Dynamics   Good Support System?  Yes      Barriers   Psychosocial barriers to participate in program  There are no identifiable barriers or psychosocial needs.      Screening Interventions   Interventions  Encouraged to exercise       Quality of Life Scores: Quality of Life - 08/20/17 1226      Quality of Life Scores   Health/Function Pre  22.57 %    Socioeconomic Pre  26.43 %    Psych/Spiritual Pre  27.43 %    Family Pre  28.8 %    GLOBAL Pre  25.28 %      Scores of 19 and below usually indicate a poorer quality of life in these areas.  A difference of  2-3 points is a clinically meaningful difference.  A difference of 2-3 points in the total score of the Quality of Life Index has been associated with significant improvement in overall quality of life, self-image, physical symptoms, and general health in studies assessing change in quality of life.  PHQ-9: Recent Review Flowsheet Data    Depression screen [facility] 2/9 08/24/2017 07/03/2014   Decreased Interest 0 1   Down, Depressed, Hopeless 0 1    PHQ - 2 Score 0 2   Change in appetite - 1   Feeling bad or failure about yourself  - 1   Moving slowly or fidgety/restless - 0     Interpretation of Total Score  Total Score Depression Severity:  1-4 = Minimal depression, 5-9 = Mild depression, 10-14 = Moderate depression, 15-19 = Moderately severe depression, 20-27 = Severe depression   Psychosocial Evaluation and Intervention:   Psychosocial Re-Evaluation: Psychosocial Re-Evaluation    Row Name 09/01/17 1204 09/30/17 2500           Psychosocial Re-Evaluation   Current issues with  None Identified  Current Stress Concerns      Comments  -  David [name] has been out due to edema in legs.      Interventions  Stress management education  Encouraged to attend Cardiac Rehabilitation for the exercise;Stress management education      Continue Psychosocial Services   No Follow up required  No Follow up required        Initial Review   Source of Stress Concerns  -  Chronic Illness         Psychosocial Discharge (Final Psychosocial Re-Evaluation): Psychosocial Re-Evaluation - 09/30/17 3704      Psychosocial Re-Evaluation   Current issues with  Current Stress Concerns    Comments  David [name] has been out due to edema in legs.    Interventions  Encouraged to attend Cardiac Rehabilitation for the exercise;Stress management education    Continue Psychosocial Services   No Follow up required      Initial Review   Source of Stress Concerns  Chronic Illness       Vocational Rehabilitation: Provide vocational rehab assistance to qualifying candidates.   Vocational Rehab Evaluation & Intervention: Vocational Rehab - 08/20/17 1306      Initial Vocational Rehab Evaluation & Intervention   Assessment shows need for Vocational Rehabilitation  No  Education: Education Goals: Education classes will be provided on a weekly basis, covering required topics. Participant will state understanding/return demonstration of topics  presented.  Learning Barriers/Preferences: Learning Barriers/Preferences - 08/19/17 1041      Learning Barriers/Preferences   Learning Barriers  Sight    Learning Preferences  Written Material       Education Topics: Count Your Pulse:  -Group instruction provided by verbal instruction, demonstration, patient participation and written materials to support subject.  Instructors address importance of being able to find your pulse and how to count your pulse when at home without a heart monitor.  Patients get hands on experience counting their pulse with staff help and individually.   Heart Attack, Angina, and Risk Factor Modification:  -Group instruction provided by verbal instruction, video, and written materials to support subject.  Instructors address signs and symptoms of angina and heart attacks.    Also discuss risk factors for heart disease and how to make changes to improve heart health risk factors.   Functional Fitness:  -Group instruction provided by verbal instruction, demonstration, patient participation, and written materials to support subject.  Instructors address safety measures for doing things around the house.  Discuss how to get up and down off the floor, how to pick things up properly, how to safely get out of a chair without assistance, and balance training.   Meditation and Mindfulness:  -Group instruction provided by verbal instruction, patient participation, and written materials to support subject.  Instructor addresses importance of mindfulness and meditation practice to help reduce stress and improve awareness.  Instructor also leads participants through a meditation exercise.    Stretching for Flexibility and Mobility:  -Group instruction provided by verbal instruction, patient participation, and written materials to support subject.  Instructors lead participants through series of stretches that are designed to increase flexibility thus improving mobility.   These stretches are additional exercise for major muscle groups that are typically performed during regular warm up and cool down.   Hands Only CPR:  -Group verbal, video, and participation provides a basic overview of AHA guidelines for community CPR. Role-play of emergencies allow participants the opportunity to practice calling for help and chest compression technique with discussion of AED use.   Hypertension: -Group verbal and written instruction that provides a basic overview of hypertension including the most recent diagnostic guidelines, risk factor reduction with self-care instructions and medication management.    Nutrition I class: Heart Healthy Eating:  -Group instruction provided by PowerPoint slides, verbal discussion, and written materials to support subject matter. The instructor gives an explanation and review of the Therapeutic Lifestyle Changes diet recommendations, which includes a discussion on lipid goals, dietary fat, sodium, fiber, plant stanol/sterol esters, sugar, and the components of a well-balanced, healthy diet.   CARDIAC REHAB PHASE II EXERCISE from 09/02/2017 in [place]  Date  09/02/17  Educator  RD      Nutrition II class: Lifestyle Skills:  -Group instruction provided by PowerPoint slides, verbal discussion, and written materials to support subject matter. The instructor gives an explanation and review of label reading, grocery shopping for heart health, heart healthy recipe modifications, and ways to make healthier choices when eating out.   CARDIAC REHAB PHASE II EXERCISE from 09/02/2017 in [place]  Date  09/02/17  Educator  RD      Diabetes Question & Answer:  -Group instruction provided by PowerPoint slides, verbal discussion, and written materials to support  subject matter. The instructor gives an explanation and review of diabetes co-morbidities, pre- and post-prandial blood  glucose goals, pre-exercise blood glucose goals, signs, symptoms, and treatment of hypoglycemia and hyperglycemia, and foot care basics.   Diabetes Blitz:  -Group instruction provided by PowerPoint slides, verbal discussion, and written materials to support subject matter. The instructor gives an explanation and review of the physiology behind type 1 and type 2 diabetes, diabetes medications and rational behind using different medications, pre- and post-prandial blood glucose recommendations and Hemoglobin A1c goals, diabetes diet, and exercise including blood glucose guidelines for exercising safely.    CARDIAC REHAB PHASE II EXERCISE from 09/02/2017 in [place]  Date  09/02/17  Educator  RD      Portion Distortion:  -Group instruction provided by PowerPoint slides, verbal discussion, written materials, and food models to support subject matter. The instructor gives an explanation of serving size versus portion size, changes in portions sizes over the last 20 years, and what consists of a serving from each food group.   Stress Management:  -Group instruction provided by verbal instruction, video, and written materials to support subject matter.  Instructors review role of stress in heart disease and how to cope with stress positively.     Exercising on Your Own:  -Group instruction provided by verbal instruction, power point, and written materials to support subject.  Instructors discuss benefits of exercise, components of exercise, frequency and intensity of exercise, and end points for exercise.  Also discuss use of nitroglycerin and activating EMS.  Review options of places to exercise outside of rehab.  Review guidelines for sex with heart disease.   Cardiac Drugs I:  -Group instruction provided by verbal instruction and written materials to support subject.  Instructor reviews cardiac drug classes: antiplatelets, anticoagulants, beta blockers, and  statins.  Instructor discusses reasons, side effects, and lifestyle considerations for each drug class.   Cardiac Drugs II:  -Group instruction provided by verbal instruction and written materials to support subject.  Instructor reviews cardiac drug classes: angiotensin converting enzyme inhibitors (ACE-I), angiotensin II receptor blockers (ARBs), nitrates, and calcium [name] blockers.  Instructor discusses reasons, side effects, and lifestyle considerations for each drug class.   Anatomy and Physiology of the Circulatory System:  Group verbal and written instruction and models provide basic cardiac anatomy and physiology, with the coronary electrical and arterial systems. Review of: AMI, Angina, Valve disease, Heart Failure, Peripheral Artery Disease, Cardiac Arrhythmia, Pacemakers, and the ICD.   Other Education:  -Group or individual verbal, written, or video instructions that support the educational goals of the cardiac rehab program.   Holiday Eating Survival Tips:  -Group instruction provided by PowerPoint slides, verbal discussion, and written materials to support subject matter. The instructor gives patients tips, tricks, and techniques to help them not only survive but enjoy the holidays despite the onslaught of food that accompanies the holidays.   Knowledge Questionnaire Score: Knowledge Questionnaire Score - 08/20/17 1226      Knowledge Questionnaire Score   Pre Score  18/24       Core Components/Risk Factors/Patient Goals at Admission: Personal Goals and Risk Factors at Admission - 08/20/17 1236      Core Components/Risk Factors/Patient Goals on Admission    Weight Management  Yes    Intervention  Weight Management: Develop a combined nutrition and exercise program designed to reach desired caloric intake, while maintaining appropriate intake of nutrient and fiber, sodium and fats, and appropriate energy expenditure  required for the weight goal.;Weight Management:  Provide education and appropriate resources to help participant work on and attain dietary goals.;Weight Management/Obesity: Establish reasonable short term and long term weight goals.    Admit Weight  232 lb 5.8 oz (105.4 kg)    Goal Weight: Short Term  225 lb (102.1 kg)    Goal Weight: Long Term  220 lb (99.8 kg)    Expected Outcomes  Short Term: Continue to assess and modify interventions until short term weight is achieved;Long Term: Adherence to nutrition and physical activity/exercise program aimed toward attainment of established weight goal;Weight Loss: Understanding of general recommendations for a balanced deficit meal plan, which promotes 1-2 lb weight loss per week and includes a negative energy balance of [phone] kcal/d;Understanding recommendations for meals to include 15-35% energy as protein, 25-35% energy from fat, 35-60% energy from carbohydrates, less than 200mg  of dietary cholesterol, 20-35 gm of total fiber daily;Understanding of distribution of calorie intake throughout the day with the consumption of 4-5 meals/snacks    Diabetes  Yes    Intervention  Provide education about signs/symptoms and action to take for hypo/hyperglycemia.;Provide education about proper nutrition, including hydration, and aerobic/resistive exercise prescription along with prescribed medications to achieve blood glucose in normal ranges: Fasting glucose 65-99 mg/dL    Expected Outcomes  Short Term: Participant verbalizes understanding of the signs/symptoms and immediate care of hyper/hypoglycemia, proper foot care and importance of medication, aerobic/resistive exercise and nutrition plan for blood glucose control.;Long Term: Attainment of HbA1C < 7%.    Hypertension  Yes    Intervention  Provide education on lifestyle modifcations including regular physical activity/exercise, weight management, moderate sodium restriction and increased consumption of fresh fruit, vegetables, and low fat dairy, alcohol  moderation, and smoking cessation.;Monitor prescription use compliance.    Expected Outcomes  Short Term: Continued assessment and intervention until BP is < 140/30mm HG in hypertensive participants. < 130/18mm HG in hypertensive participants with diabetes, heart failure or chronic kidney disease.;Long Term: Maintenance of blood pressure at goal levels.    Lipids  Yes    Intervention  Provide education and support for participant on nutrition & aerobic/resistive exercise along with prescribed medications to achieve LDL 70mg , HDL >40mg .    Expected Outcomes  Short Term: Participant states understanding of desired cholesterol values and is compliant with medications prescribed. Participant is following exercise prescription and nutrition guidelines.;Long Term: Cholesterol controlled with medications as prescribed, with individualized exercise RX and with personalized nutrition plan. Value goals: LDL < 70mg , HDL > 40 mg.       Core Components/Risk Factors/Patient Goals Review:  Goals and Risk Factor Review    Row Name 09/01/17 1200 09/30/17 0851           Core Components/Risk Factors/Patient Goals Review   Personal Goals Review  Weight Management/Obesity;Lipids;Hypertension;Diabetes  Weight Management/Obesity;Lipids;Hypertension;Diabetes      Review  Patient off to a good start to exercise.   David [name]'s vital signs and CBG's have been stable. Patient has made slow progress due to deconditioning      Expected Outcomes  Patient will continue to take his medications as presribed and come to cardiac rehab for exercise  Patient will continue to take his medications as presribed and come to cardiac rehab for exercise         Core Components/Risk Factors/Patient Goals at Discharge (Final Review):  Goals and Risk Factor Review - 09/30/17 0851      Core Components/Risk Factors/Patient Goals Review   Personal Goals Review  Weight Management/Obesity;Lipids;Hypertension;Diabetes    Review  David [name]'s vital  signs and CBG's have been stable. Patient has made slow progress due to deconditioning    Expected Outcomes  Patient will continue to take his medications as presribed and come to cardiac rehab for exercise       ITP Comments: ITP Comments    Row Name 08/20/17 8811 09/01/17 1158 09/30/17 0850       ITP Comments  Dr. Fransico Him, Medical Director  30 day ITP review. Patient off to a good start to exercise. will continue to monitor CBG's  30 day ITP review. David [name] has been absent for the past two weeks. Patient has made slow progress when in attendance        Comments: See ITP comments. Patient has been absent due to swelling in legs. David [name] has been absent since 09/11/17 and hopes to return to exercise after he sees Dr [name] on 10/09/17.Barnet Pall, RN,BSN 10/01/2017 10:07 AM

## 2017-10-02 ENCOUNTER — Encounter (HOSPITAL_COMMUNITY): Payer: Medicare Other

## 2017-10-05 ENCOUNTER — Encounter (HOSPITAL_COMMUNITY): Payer: Medicare Other

## 2017-10-07 ENCOUNTER — Encounter (HOSPITAL_COMMUNITY): Payer: Medicare Other

## 2017-10-09 ENCOUNTER — Encounter (HOSPITAL_COMMUNITY): Payer: Medicare Other

## 2017-10-14 ENCOUNTER — Encounter (HOSPITAL_COMMUNITY): Payer: Medicare Other

## 2017-10-16 ENCOUNTER — Encounter (HOSPITAL_COMMUNITY): Payer: Medicare Other

## 2017-10-16 ENCOUNTER — Telehealth (HOSPITAL_COMMUNITY): Payer: Self-pay | Admitting: *Deleted

## 2017-10-19 ENCOUNTER — Encounter (HOSPITAL_COMMUNITY): Payer: Medicare Other

## 2017-10-21 ENCOUNTER — Encounter (HOSPITAL_COMMUNITY): Payer: Medicare Other

## 2017-10-21 ENCOUNTER — Encounter (HOSPITAL_COMMUNITY)
Admission: RE | Admit: 2017-10-21 | Discharge: 2017-10-21 | Disposition: A | Discharge status: Home / Self Care | Payer: Medicare Other | Source: Ambulatory Visit | Attending: Cardiology | Admitting: Cardiology

## 2017-10-21 DIAGNOSIS — Z79899 Other long term (current) drug therapy: Secondary | ICD-10-CM | POA: Insufficient documentation

## 2017-10-21 DIAGNOSIS — I110 Hypertensive heart disease with heart failure: Secondary | ICD-10-CM | POA: Diagnosis not present

## 2017-10-21 DIAGNOSIS — I509 Heart failure, unspecified: Secondary | ICD-10-CM | POA: Insufficient documentation

## 2017-10-21 DIAGNOSIS — E119 Type 2 diabetes mellitus without complications: Secondary | ICD-10-CM | POA: Diagnosis not present

## 2017-10-21 DIAGNOSIS — Z7982 Long term (current) use of aspirin: Secondary | ICD-10-CM | POA: Insufficient documentation

## 2017-10-21 DIAGNOSIS — Z955 Presence of coronary angioplasty implant and graft: Secondary | ICD-10-CM

## 2017-10-21 LAB — GLUCOSE, CAPILLARY: Glucose-Capillary: 129 mg/dL — ABNORMAL HIGH (ref 65–99)

## 2017-10-21 NOTE — Progress Notes (Signed)
Patient returned to exercise and completed today's session without difficulty. [name]'s last day of exercise was on 09/11/17.Barnet Pall, RN,BSN 10/21/2017 11:59 AM

## 2017-10-23 ENCOUNTER — Encounter (HOSPITAL_COMMUNITY): Payer: Medicare Other

## 2017-10-23 ENCOUNTER — Encounter (HOSPITAL_COMMUNITY)
Admission: RE | Admit: 2017-10-23 | Discharge: 2017-10-23 | Disposition: A | Discharge status: Home / Self Care | Payer: Medicare Other | Source: Ambulatory Visit | Attending: Cardiology | Admitting: Cardiology

## 2017-10-23 DIAGNOSIS — Z955 Presence of coronary angioplasty implant and graft: Secondary | ICD-10-CM

## 2017-10-23 LAB — GLUCOSE, CAPILLARY: GLUCOSE-CAPILLARY: 148 mg/dL — AB (ref 65–99)

## 2017-10-26 ENCOUNTER — Encounter (HOSPITAL_COMMUNITY): Payer: Medicare Other

## 2017-10-26 ENCOUNTER — Encounter (HOSPITAL_COMMUNITY)
Admission: RE | Admit: 2017-10-26 | Discharge: 2017-10-26 | Disposition: A | Discharge status: Home / Self Care | Payer: Medicare Other | Source: Ambulatory Visit | Attending: Cardiology | Admitting: Cardiology

## 2017-10-26 DIAGNOSIS — Z955 Presence of coronary angioplasty implant and graft: Secondary | ICD-10-CM | POA: Diagnosis not present

## 2017-10-28 ENCOUNTER — Encounter (HOSPITAL_COMMUNITY): Payer: Medicare Other

## 2017-10-28 ENCOUNTER — Encounter (HOSPITAL_COMMUNITY)
Admission: RE | Admit: 2017-10-28 | Discharge: 2017-10-28 | Disposition: A | Discharge status: Home / Self Care | Payer: Medicare Other | Source: Ambulatory Visit | Attending: Cardiology | Admitting: Cardiology

## 2017-10-28 DIAGNOSIS — Z955 Presence of coronary angioplasty implant and graft: Secondary | ICD-10-CM

## 2017-10-29 NOTE — Progress Notes (Signed)
Cardiac Individual Treatment Plan  Patient Details  Name: David [name] MRN: [id number] Date of Birth: 06-Oct-1945 Referring Provider:     CARDIAC REHAB PHASE II ORIENTATION from 08/20/2017 in [facility]  Referring Provider  Adrian Prows MD       Initial Encounter Date:    CARDIAC REHAB PHASE II ORIENTATION from 08/20/2017 in [facility]  Date  08/20/17  Referring Provider  Adrian Prows MD       Visit Diagnosis: Status post coronary artery stent placement 04/06/17 S/P Stent Distal LAD, 04/07/17 S/P Stent Mid LAD  Patient's Home Medications on Admission:  Current Outpatient Medications:  .  albuterol (PROVENTIL HFA;VENTOLIN HFA) 108 (90 Base) MCG/ACT inhaler, Inhale 2 puffs into the lungs every 6 (six) hours as needed for wheezing or shortness of breath., Disp: 1 Inhaler, Rfl: 2 .  ALPRAZolam (XANAX) 1 MG tablet, TAKE 1 TABLET BY MOUTH UP TO TWICE DAILY AS NEEDED FOR ANXEITY, Disp: , Rfl: 2 .  amLODipine (NORVASC) 5 MG tablet, Take 1 tablet (5 mg total) by mouth daily., Disp: 30 tablet, Rfl: 1 .  aspirin EC 81 MG tablet, Take 81 mg by mouth daily., Disp: , Rfl:  .  atorvastatin (LIPITOR) 40 MG tablet, Take 40 mg by mouth at bedtime., Disp: , Rfl: 11 .  clopidogrel (PLAVIX) 75 MG tablet, Take 1 tablet (75 mg total) by mouth daily., Disp: 30 tablet, Rfl: 1 .  docusate sodium (COLACE) 100 MG capsule, Take 100 mg by mouth daily., Disp: , Rfl:  .  fluticasone (FLONASE) 50 MCG/ACT nasal spray, Place 1 spray into both nostrils daily., Disp: 16 g, Rfl: 1 .  furosemide (LASIX) 20 MG tablet, Take 1 tablet (20 mg total) by mouth daily. Take twice daily for 5 days, and then daily after that, Disp: 30 tablet, Rfl: 1 .  gabapentin (NEURONTIN) 600 MG tablet, Take 600 mg by mouth 2 (two) times daily. , Disp: , Rfl:  .  HYDROcodone-acetaminophen (NORCO) 10-325 MG tablet, Take 1 tablet by mouth 2 (two) times daily as needed for moderate pain. ,  Disp: , Rfl: 0 .  Insulin Detemir (LEVEMIR FLEXTOUCH) 100 UNIT/ML Pen, Inject 40 Units into the skin daily. (Patient taking differently: Inject 42 Units into the skin daily. ), Disp: 3 mL, Rfl: 1 .  iron polysaccharides (NIFEREX) 150 MG capsule, Take 1 capsule (150 mg total) by mouth daily., Disp: 30 capsule, Rfl: 1 .  isosorbide-hydrALAZINE (BIDIL) 20-37.5 MG tablet, Take 1 tablet by mouth 3 (three) times daily., Disp: 90 tablet, Rfl: 0 .  LUMIGAN 0.01 % SOLN, Place 1 drop into both eyes at bedtime., Disp: , Rfl: 99 .  methocarbamol (ROBAXIN) 750 MG tablet, Take 1 tablet (750 mg total) by mouth every 6 (six) hours as needed for muscle spasms., Disp: 30 tablet, Rfl: 0 .  metoprolol succinate (TOPROL-XL) 100 MG 24 hr tablet, Take 1 tablet (100 mg total) by mouth daily. Take with or immediately following a meal., Disp: 30 tablet, Rfl: 1  Past Medical History: Past Medical History:  Diagnosis Date  . Arthritis   . CHF (congestive heart failure) ([place])   . DDD (degenerative disc disease), cervical   . Diabetes mellitus without complication ([place])    type II  . Diabetic neuropathy ([place])   . GERD (gastroesophageal reflux disease)   . Glaucoma   . Hypertension   . Renal disorder    Stage II    Tobacco Use:  Social History   Tobacco Use  Smoking Status Never Smoker  Smokeless Tobacco Never Used    Labs: Recent Review Flowsheet Data    Labs for ITP Cardiac and Pulmonary Rehab Latest Ref Rng & Units 08/14/2013 03/29/2017 04/01/2017 04/06/2017 04/06/2017   Hemoglobin A1c 4.8 - 5.6 % 7.7(H) - 7.2(H) - -   PHART 7.350 - 7.450 - 7.388 - - 7.403   PCO2ART 32.0 - 48.0 mmHg - 46.1 - - 51.4(H)   HCO3 20.0 - 28.0 mmol/L - 27.7 - 32.7(H) 32.0(H)   TCO2 22 - 32 mmol/L - 29 - 34(H) 34(H)   O2SAT % - 99.0 - 67.0 99.0      Capillary Blood Glucose: Lab Results  Component Value Date   GLUCAP 148 (H) 10/23/2017   GLUCAP 129 (H) 10/21/2017   GLUCAP 122 (H) 09/14/2017   GLUCAP 163 (H) 09/11/2017    GLUCAP 98 09/09/2017     Exercise Target Goals:    Exercise Program Goal: Individual exercise prescription set using results from initial 6 min walk test and THRR while considering  patient's activity barriers and safety.   Exercise Prescription Goal: Initial exercise prescription builds to 30-45 minutes a day of aerobic activity, 2-3 days per week.  Home exercise guidelines will be given to patient during program as part of exercise prescription that the participant will acknowledge.  Activity Barriers & Risk Stratification: Activity Barriers & Cardiac Risk Stratification - 08/20/17 1300      Activity Barriers & Cardiac Risk Stratification   Comments  Right Artificial Hip,  history Left Ankle Broken: Occasional Pain, Amputated Toe        6 Minute Walk: 6 Minute Walk    Row Name 08/20/17 1242         6 Minute Walk   Phase  Initial     Distance  900 feet     Walk Time  6 minutes     # of Rest Breaks  0     MPH  1.7     METS  2.1     RPE  11     Perceived Dyspnea   0     VO2 Peak  7.4     Symptoms  Yes (comment)     Comments  Pain in Left Hip and Lower Back 4/10     Resting HR  71 bpm     Resting BP  136/64     Resting Oxygen Saturation   98 %     Exercise Oxygen Saturation  during 6 min walk  95 %     Max Ex. HR  91 bpm     Max Ex. BP  160/76     2 Minute Post BP  128/60        Oxygen Initial Assessment:   Oxygen Re-Evaluation:   Oxygen Discharge (Final Oxygen Re-Evaluation):   Initial Exercise Prescription: Initial Exercise Prescription - 08/20/17 1200      Date of Initial Exercise RX and Referring Provider   Date  08/20/17    Referring Provider  Adrian Prows MD       NuStep   Level  2    SPM  70    Minutes  20    METs  1.9      Arm Ergometer   Level  2    Watts  25    Minutes  10    METs  2.05      Prescription Details   Frequency (  times per week)  3x    Duration  Progress to 30 minutes of continuous aerobic without signs/symptoms of  physical distress      Intensity   THRR 40-80% of Max Heartrate  60-120    Ratings of Perceived Exertion  11-13    Perceived Dyspnea  0-4      Progression   Progression  Continue progressive overload as per policy without signs/symptoms or physical distress.      Resistance Training   Training Prescription  Yes    Weight  3lbs    Reps  10-15       Perform Capillary Blood Glucose checks as needed.  Exercise Prescription Changes: Exercise Prescription Changes    Row Name 08/26/17 0950 09/07/17 0954 09/14/17 0949 10/21/17 0950       Response to Exercise   Blood Pressure (Admit)  148/80  154/80  136/70  102/60    Blood Pressure (Exercise)  142/84  148/70  146/70  126/62    Blood Pressure (Exit)  130/68  128/72  122/70  118/60    Heart Rate (Admit)  71 bpm  85 bpm  69 bpm  64 bpm    Heart Rate (Exercise)  83 bpm  96 bpm  77 bpm  74 bpm    Heart Rate (Exit)  68 bpm  75 bpm  65 bpm  58 bpm    Rating of Perceived Exertion (Exercise)  11  11  11  12     Symptoms  none  none  none  none    Duration  Progress to 30 minutes of  aerobic without signs/symptoms of physical distress  Progress to 30 minutes of  aerobic without signs/symptoms of physical distress  Progress to 30 minutes of  aerobic without signs/symptoms of physical distress  Progress to 30 minutes of  aerobic without signs/symptoms of physical distress    Intensity  THRR unchanged  THRR unchanged  THRR unchanged  THRR unchanged      Progression   Progression  Continue to progress workloads to maintain intensity without signs/symptoms of physical distress.  Continue to progress workloads to maintain intensity without signs/symptoms of physical distress.  Continue to progress workloads to maintain intensity without signs/symptoms of physical distress.  Continue to progress workloads to maintain intensity without signs/symptoms of physical distress.    Average METs  2  2.7  2.2  2      Resistance Training   Training Prescription   No Relaxation day, no weights.  No  No  No Relaxation day, no weights      Interval Training   Interval Training  No  No  No  No      Bike   Level  1  -  -  -    Minutes  10  -  -  -    METs  1.7  -  -  -      NuStep   Level  2  2  3  3     SPM  85  85  85  85    Minutes  20  20  20  20     METs  2.3  2.7  2.5  2.4      Arm Ergometer   Level  -  4  4  4     Watts  -  -  -  -    Minutes  -  10  10  10     METs  -  -  1.9  1.6      Home Exercise Plan   Plans to continue exercise at  -  [employer] (comment)  [employer] (comment)  [employer] (comment)    Frequency  -  Add 2 additional days to program exercise sessions.  Add 2 additional days to program exercise sessions.  Add 2 additional days to program exercise sessions.    Initial Home Exercises Provided  -  08/31/17  08/31/17  08/31/17       Exercise Comments: Exercise Comments    Row Name 08/26/17 1413 08/31/17 1022 09/07/17 0954 09/30/17 1150 10/21/17 1000   Exercise Comments  Reviewed goals with patient.  Reviewed home exercise guidelines and METs with patient.  Reviewed METs with patient.  Patient out due to onset of heart failure this past weekend. Pt will be out all this week.  Reviewed METs and goals with patient.      Exercise Goals and Review: Exercise Goals    Row Name 08/20/17 1246 09/30/17 1149           Exercise Goals   Increase Physical Activity  Yes  Yes      Intervention  Provide advice, education, support and counseling about physical activity/exercise needs.;Develop an individualized exercise prescription for aerobic and resistive training based on initial evaluation findings, risk stratification, comorbidities and participant's personal goals.  Provide advice, education, support and counseling about physical activity/exercise needs.;Develop an individualized exercise prescription for aerobic and resistive training based on initial evaluation findings, risk stratification,  comorbidities and participant's personal goals.      Expected Outcomes  Short Term: Attend rehab on a regular basis to increase amount of physical activity.;Long Term: Add in home exercise to make exercise part of routine and to increase amount of physical activity.;Long Term: Exercising regularly at least 3-5 days a week.  Short Term: Attend rehab on a regular basis to increase amount of physical activity.;Long Term: Add in home exercise to make exercise part of routine and to increase amount of physical activity.;Long Term: Exercising regularly at least 3-5 days a week.      Increase Strength and Stamina  Yes  Yes      Intervention  Provide advice, education, support and counseling about physical activity/exercise needs.;Develop an individualized exercise prescription for aerobic and resistive training based on initial evaluation findings, risk stratification, comorbidities and participant's personal goals.  Provide advice, education, support and counseling about physical activity/exercise needs.;Develop an individualized exercise prescription for aerobic and resistive training based on initial evaluation findings, risk stratification, comorbidities and participant's personal goals.      Expected Outcomes  Short Term: Increase workloads from initial exercise prescription for resistance, speed, and METs.;Short Term: Perform resistance training exercises routinely during rehab and add in resistance training at home;Long Term: Improve cardiorespiratory fitness, muscular endurance and strength as measured by increased METs and functional capacity (6MWT)  Short Term: Increase workloads from initial exercise prescription for resistance, speed, and METs.;Short Term: Perform resistance training exercises routinely during rehab and add in resistance training at home;Long Term: Improve cardiorespiratory fitness, muscular endurance and strength as measured by increased METs and functional capacity (6MWT)      Able to  understand and use rate of perceived exertion (RPE) scale  Yes  Yes      Intervention  Provide education and explanation on how to use RPE scale  Provide education and explanation on how to use RPE scale      Expected Outcomes  Short Term: Able to  use RPE daily in rehab to express subjective intensity level;Long Term:  Able to use RPE to guide intensity level when exercising independently  Short Term: Able to use RPE daily in rehab to express subjective intensity level;Long Term:  Able to use RPE to guide intensity level when exercising independently      Knowledge and understanding of Target Heart Rate Range (THRR)  Yes  Yes      Intervention  Provide education and explanation of THRR including how the numbers were predicted and where they are located for reference  Provide education and explanation of THRR including how the numbers were predicted and where they are located for reference      Expected Outcomes  Short Term: Able to state/look up THRR;Short Term: Able to use daily as guideline for intensity in rehab;Long Term: Able to use THRR to govern intensity when exercising independently  Short Term: Able to state/look up THRR;Short Term: Able to use daily as guideline for intensity in rehab;Long Term: Able to use THRR to govern intensity when exercising independently      Able to check pulse independently  Yes  Yes      Intervention  Provide education and demonstration on how to check pulse in carotid and radial arteries.;Review the importance of being able to check your own pulse for safety during independent exercise  Provide education and demonstration on how to check pulse in carotid and radial arteries.;Review the importance of being able to check your own pulse for safety during independent exercise      Expected Outcomes  Short Term: Able to explain why pulse checking is important during independent exercise;Long Term: Able to check pulse independently and accurately  Short Term: Able to explain  why pulse checking is important during independent exercise;Long Term: Able to check pulse independently and accurately      Understanding of Exercise Prescription  Yes  Yes      Intervention  Provide education, explanation, and written materials on patient's individual exercise prescription  Provide education, explanation, and written materials on patient's individual exercise prescription      Expected Outcomes  Short Term: Able to explain program exercise prescription;Long Term: Able to explain home exercise prescription to exercise independently  Short Term: Able to explain program exercise prescription;Long Term: Able to explain home exercise prescription to exercise independently         Exercise Goals Re-Evaluation : Exercise Goals Re-Evaluation    Row Name 08/26/17 1410 08/31/17 1022 09/30/17 1149 10/21/17 1000       Exercise Goal Re-Evaluation   Exercise Goals Review  Able to understand and use rate of perceived exertion (RPE) scale;Increase Physical Activity  Understanding of Exercise Prescription  -  Increase Physical Activity    Comments  Patient completed 1st day of exercise at cardiac rehab and tolerated low intensity exercise well. Pt able to understand and use the RPE scale appropriately. Pt previously exercised at the gym and participated in the [employer] program. Pt's goal is to return to exercise at the gym.  Home exercise guidelines given. Patient states his goal is to return to exercise at [employer] or [employer] 5 days/week.  Patient developed heart failure over the weekend and will be out all week. Unable to review goals at this time.   Patient has been about 3 weeks due to CHF. Pt was able to tolerate low intensity exercise. Encouraged pt to increase pace on his stations.    Expected Outcomes  Increase workloads  as tolerated to help achieve personal health and fitness goals.   Patient will add 1-2 days aerobic exercise walking or at local gym in addition to exercise  at [facility].  -  Increase workloads as tolerated.        Discharge Exercise Prescription (Final Exercise Prescription Changes): Exercise Prescription Changes - 10/21/17 0950      Response to Exercise   Blood Pressure (Admit)  102/60    Blood Pressure (Exercise)  126/62    Blood Pressure (Exit)  118/60    Heart Rate (Admit)  64 bpm    Heart Rate (Exercise)  74 bpm    Heart Rate (Exit)  58 bpm    Rating of Perceived Exertion (Exercise)  12    Symptoms  none    Duration  Progress to 30 minutes of  aerobic without signs/symptoms of physical distress    Intensity  THRR unchanged      Progression   Progression  Continue to progress workloads to maintain intensity without signs/symptoms of physical distress.    Average METs  2      Resistance Training   Training Prescription  No Relaxation day, no weights      Interval Training   Interval Training  No      NuStep   Level  3    SPM  85    Minutes  20    METs  2.4      Arm Ergometer   Level  4    Minutes  10    METs  1.6      Home Exercise Plan   Plans to continue exercise at  [facility] (comment)    Frequency  Add 2 additional days to program exercise sessions.    Initial Home Exercises Provided  08/31/17       Nutrition:  Target Goals: Understanding of nutrition guidelines, daily intake of sodium 1500mg , cholesterol 200mg , calories 30% from fat and 7% or less from saturated fats, daily to have 5 or more servings of fruits and vegetables.  Biometrics: Pre Biometrics - 08/20/17 1243      Pre Biometrics   Height  6' 0.25" (1.835 m)    Weight  232 lb 5.8 oz (105.4 kg)    Waist Circumference  47.5 inches    Hip Circumference  44.5 inches    Waist to Hip Ratio  1.07 %    BMI (Calculated)  31.3    Triceps Skinfold  10 mm    % Body Fat  30.4 %    Grip Strength  30 kg    Flexibility  0 in    Single Leg Stand  0 seconds        Nutrition Therapy Plan and Nutrition Goals: Nutrition Therapy & Goals - 08/20/17  1449      Nutrition Therapy   Diet  Carb Modified, Heart Healthy      Personal Nutrition Goals   Nutrition Goal  Pt to identify food quantities necessary to achieve weight loss of 6-24 lb (2.7-10.9 kg) at graduation from cardiac rehab. Goal wt of 180-190 lb desired.     Personal Goal #2  Pt to make good choices when eating out at restaurants.    Personal Goal #3  Pt to choose healthy, balanced snacks      Intervention Plan   Intervention  Prescribe, educate and counsel regarding individualized specific dietary modifications aiming towards targeted core components such as weight, hypertension, lipid management, diabetes, heart failure and  other comorbidities.    Expected Outcomes  Short Term Goal: Understand basic principles of dietary content, such as calories, fat, sodium, cholesterol and nutrients.;Long Term Goal: Adherence to prescribed nutrition plan.       Nutrition Assessments: Nutrition Assessments - 08/20/17 1449      MEDFICTS Scores   Pre Score  27       Nutrition Goals Re-Evaluation:   Nutrition Goals Re-Evaluation:   Nutrition Goals Discharge (Final Nutrition Goals Re-Evaluation):   Psychosocial: Target Goals: Acknowledge presence or absence of significant depression and/or stress, maximize coping skills, provide positive support system. Participant is able to verbalize types and ability to use techniques and skills needed for reducing stress and depression.  Initial Review & Psychosocial Screening: Initial Psych Review & Screening - 08/20/17 1254      Initial Review   Current issues with  None Identified      Family Dynamics   Good Support System?  Yes      Barriers   Psychosocial barriers to participate in program  There are no identifiable barriers or psychosocial needs.      Screening Interventions   Interventions  Encouraged to exercise       Quality of Life Scores: Quality of Life - 08/20/17 1226      Quality of Life Scores   Health/Function  Pre  22.57 %    Socioeconomic Pre  26.43 %    Psych/Spiritual Pre  27.43 %    Family Pre  28.8 %    GLOBAL Pre  25.28 %      Scores of 19 and below usually indicate a poorer quality of life in these areas.  A difference of  2-3 points is a clinically meaningful difference.  A difference of 2-3 points in the total score of the Quality of Life Index has been associated with significant improvement in overall quality of life, self-image, physical symptoms, and general health in studies assessing change in quality of life.  PHQ-9: Recent Review Flowsheet Data    Depression screen [facility] 2/9 08/24/2017 07/03/2014   Decreased Interest 0 1   Down, Depressed, Hopeless 0 1   PHQ - 2 Score 0 2   Change in appetite - 1   Feeling bad or failure about yourself  - 1   Moving slowly or fidgety/restless - 0     Interpretation of Total Score  Total Score Depression Severity:  1-4 = Minimal depression, 5-9 = Mild depression, 10-14 = Moderate depression, 15-19 = Moderately severe depression, 20-27 = Severe depression   Psychosocial Evaluation and Intervention:   Psychosocial Re-Evaluation: Psychosocial Re-Evaluation    Row Name 09/01/17 1204 09/30/17 0852 10/29/17 1043         Psychosocial Re-Evaluation   Current issues with  None Identified  Current Stress Concerns  Current Stress Concerns     Comments  -  David [name] has been out due to edema in legs.  David [name] retured to exercise at cardiac rehab after being absent for over a month.     Interventions  Stress management education  Encouraged to attend Cardiac Rehabilitation for the exercise;Stress management education  Encouraged to attend Cardiac Rehabilitation for the exercise;Stress management education     Continue Psychosocial Services   No Follow up required  No Follow up required  No Follow up required       Initial Review   Source of Stress Concerns  -  Chronic Illness  Chronic Illness  Psychosocial Discharge (Final Psychosocial  Re-Evaluation): Psychosocial Re-Evaluation - 10/29/17 1043      Psychosocial Re-Evaluation   Current issues with  Current Stress Concerns    Comments  David [name] retured to exercise at cardiac rehab after being absent for over a month.    Interventions  Encouraged to attend Cardiac Rehabilitation for the exercise;Stress management education    Continue Psychosocial Services   No Follow up required      Initial Review   Source of Stress Concerns  Chronic Illness       Vocational Rehabilitation: Provide vocational rehab assistance to qualifying candidates.   Vocational Rehab Evaluation & Intervention: Vocational Rehab - 08/20/17 1306      Initial Vocational Rehab Evaluation & Intervention   Assessment shows need for Vocational Rehabilitation  No       Education: Education Goals: Education classes will be provided on a weekly basis, covering required topics. Participant will state understanding/return demonstration of topics presented.  Learning Barriers/Preferences: Learning Barriers/Preferences - 08/19/17 1041      Learning Barriers/Preferences   Learning Barriers  Sight    Learning Preferences  Written Material       Education Topics: Count Your Pulse:  -Group instruction provided by verbal instruction, demonstration, patient participation and written materials to support subject.  Instructors address importance of being able to find your pulse and how to count your pulse when at home without a heart monitor.  Patients get hands on experience counting their pulse with staff help and individually.   Heart Attack, Angina, and Risk Factor Modification:  -Group instruction provided by verbal instruction, video, and written materials to support subject.  Instructors address signs and symptoms of angina and heart attacks.    Also discuss risk factors for heart disease and how to make changes to improve heart health risk factors.   Functional Fitness:  -Group instruction  provided by verbal instruction, demonstration, patient participation, and written materials to support subject.  Instructors address safety measures for doing things around the house.  Discuss how to get up and down off the floor, how to pick things up properly, how to safely get out of a chair without assistance, and balance training.   Meditation and Mindfulness:  -Group instruction provided by verbal instruction, patient participation, and written materials to support subject.  Instructor addresses importance of mindfulness and meditation practice to help reduce stress and improve awareness.  Instructor also leads participants through a meditation exercise.    Stretching for Flexibility and Mobility:  -Group instruction provided by verbal instruction, patient participation, and written materials to support subject.  Instructors lead participants through series of stretches that are designed to increase flexibility thus improving mobility.  These stretches are additional exercise for major muscle groups that are typically performed during regular warm up and cool down.   Hands Only CPR:  -Group verbal, video, and participation provides a basic overview of AHA guidelines for community CPR. Role-play of emergencies allow participants the opportunity to practice calling for help and chest compression technique with discussion of AED use.   Hypertension: -Group verbal and written instruction that provides a basic overview of hypertension including the most recent diagnostic guidelines, risk factor reduction with self-care instructions and medication management.    Nutrition I class: Heart Healthy Eating:  -Group instruction provided by PowerPoint slides, verbal discussion, and written materials to support subject matter. The instructor gives an explanation and review of the Therapeutic Lifestyle Changes diet recommendations, which includes a discussion on  lipid goals, dietary fat, sodium, fiber,  plant stanol/sterol esters, sugar, and the components of a well-balanced, healthy diet.   CARDIAC REHAB PHASE II EXERCISE from 09/02/2017 in [place]  Date  09/02/17  Educator  RD      Nutrition II class: Lifestyle Skills:  -Group instruction provided by PowerPoint slides, verbal discussion, and written materials to support subject matter. The instructor gives an explanation and review of label reading, grocery shopping for heart health, heart healthy recipe modifications, and ways to make healthier choices when eating out.   CARDIAC REHAB PHASE II EXERCISE from 09/02/2017 in [place]  Date  09/02/17  Educator  RD      Diabetes Question & Answer:  -Group instruction provided by PowerPoint slides, verbal discussion, and written materials to support subject matter. The instructor gives an explanation and review of diabetes co-morbidities, pre- and post-prandial blood glucose goals, pre-exercise blood glucose goals, signs, symptoms, and treatment of hypoglycemia and hyperglycemia, and foot care basics.   Diabetes Blitz:  -Group instruction provided by PowerPoint slides, verbal discussion, and written materials to support subject matter. The instructor gives an explanation and review of the physiology behind type 1 and type 2 diabetes, diabetes medications and rational behind using different medications, pre- and post-prandial blood glucose recommendations and Hemoglobin A1c goals, diabetes diet, and exercise including blood glucose guidelines for exercising safely.    CARDIAC REHAB PHASE II EXERCISE from 09/02/2017 in [place]  Date  09/02/17  Educator  RD      Portion Distortion:  -Group instruction provided by PowerPoint slides, verbal discussion, written materials, and food models to support subject matter. The instructor gives an explanation of serving size versus portion size, changes in  portions sizes over the last 20 years, and what consists of a serving from each food group.   Stress Management:  -Group instruction provided by verbal instruction, video, and written materials to support subject matter.  Instructors review role of stress in heart disease and how to cope with stress positively.     Exercising on Your Own:  -Group instruction provided by verbal instruction, power point, and written materials to support subject.  Instructors discuss benefits of exercise, components of exercise, frequency and intensity of exercise, and end points for exercise.  Also discuss use of nitroglycerin and activating EMS.  Review options of places to exercise outside of rehab.  Review guidelines for sex with heart disease.   Cardiac Drugs I:  -Group instruction provided by verbal instruction and written materials to support subject.  Instructor reviews cardiac drug classes: antiplatelets, anticoagulants, beta blockers, and statins.  Instructor discusses reasons, side effects, and lifestyle considerations for each drug class.   Cardiac Drugs II:  -Group instruction provided by verbal instruction and written materials to support subject.  Instructor reviews cardiac drug classes: angiotensin converting enzyme inhibitors (ACE-I), angiotensin II receptor blockers (ARBs), nitrates, and calcium [name] blockers.  Instructor discusses reasons, side effects, and lifestyle considerations for each drug class.   Anatomy and Physiology of the Circulatory System:  Group verbal and written instruction and models provide basic cardiac anatomy and physiology, with the coronary electrical and arterial systems. Review of: AMI, Angina, Valve disease, Heart Failure, Peripheral Artery Disease, Cardiac Arrhythmia, Pacemakers, and the ICD.   Other Education:  -Group or individual verbal, written, or video instructions that support the educational goals of the cardiac rehab program.   Holiday Eating Survival  Tips:  -Group instruction provided by PowerPoint slides, verbal discussion, and written materials to support subject matter. The instructor gives patients tips, tricks, and techniques to help them not only survive but enjoy the holidays despite the onslaught of food that accompanies the holidays.   Knowledge Questionnaire Score: Knowledge Questionnaire Score - 08/20/17 1226      Knowledge Questionnaire Score   Pre Score  18/24       Core Components/Risk Factors/Patient Goals at Admission: Personal Goals and Risk Factors at Admission - 08/20/17 1236      Core Components/Risk Factors/Patient Goals on Admission    Weight Management  Yes    Intervention  Weight Management: Develop a combined nutrition and exercise program designed to reach desired caloric intake, while maintaining appropriate intake of nutrient and fiber, sodium and fats, and appropriate energy expenditure required for the weight goal.;Weight Management: Provide education and appropriate resources to help participant work on and attain dietary goals.;Weight Management/Obesity: Establish reasonable short term and long term weight goals.    Admit Weight  232 lb 5.8 oz (105.4 kg)    Goal Weight: Short Term  225 lb (102.1 kg)    Goal Weight: Long Term  220 lb (99.8 kg)    Expected Outcomes  Short Term: Continue to assess and modify interventions until short term weight is achieved;Long Term: Adherence to nutrition and physical activity/exercise program aimed toward attainment of established weight goal;Weight Loss: Understanding of general recommendations for a balanced deficit meal plan, which promotes 1-2 lb weight loss per week and includes a negative energy balance of [id number] kcal/d;Understanding recommendations for meals to include 15-35% energy as protein, 25-35% energy from fat, 35-60% energy from carbohydrates, less than 200mg  of dietary cholesterol, 20-35 gm of total fiber daily;Understanding of distribution of calorie intake  throughout the day with the consumption of 4-5 meals/snacks    Diabetes  Yes    Intervention  Provide education about signs/symptoms and action to take for hypo/hyperglycemia.;Provide education about proper nutrition, including hydration, and aerobic/resistive exercise prescription along with prescribed medications to achieve blood glucose in normal ranges: Fasting glucose 65-99 mg/dL    Expected Outcomes  Short Term: Participant verbalizes understanding of the signs/symptoms and immediate care of hyper/hypoglycemia, proper foot care and importance of medication, aerobic/resistive exercise and nutrition plan for blood glucose control.;Long Term: Attainment of HbA1C < 7%.    Hypertension  Yes    Intervention  Provide education on lifestyle modifcations including regular physical activity/exercise, weight management, moderate sodium restriction and increased consumption of fresh fruit, vegetables, and low fat dairy, alcohol moderation, and smoking cessation.;Monitor prescription use compliance.    Expected Outcomes  Short Term: Continued assessment and intervention until BP is < 140/24mm HG in hypertensive participants. < 130/23mm HG in hypertensive participants with diabetes, heart failure or chronic kidney disease.;Long Term: Maintenance of blood pressure at goal levels.    Lipids  Yes    Intervention  Provide education and support for participant on nutrition & aerobic/resistive exercise along with prescribed medications to achieve LDL 70mg , HDL >40mg .    Expected Outcomes  Short Term: Participant states understanding of desired cholesterol values and is compliant with medications prescribed. Participant is following exercise prescription and nutrition guidelines.;Long Term: Cholesterol controlled with medications as prescribed, with individualized exercise RX and with personalized nutrition plan. Value goals: LDL < 70mg , HDL > 40 mg.       Core Components/Risk Factors/Patient Goals Review:  Goals  and Risk Factor Review    Row Name  09/01/17 1200 09/30/17 0851 10/29/17 1042         Core Components/Risk Factors/Patient Goals Review   Personal Goals Review  Weight Management/Obesity;Lipids;Hypertension;Diabetes  Weight Management/Obesity;Lipids;Hypertension;Diabetes  Weight Management/Obesity;Lipids;Hypertension;Diabetes     Review  Patient off to a good start to exercise.   David [name]'s vital signs and CBG's have been stable. Patient has made slow progress due to deconditioning  David [name]'s vital signs and CBG's have been stable. Patient has made slow progress due to deconditioning     Expected Outcomes  Patient will continue to take his medications as presribed and come to cardiac rehab for exercise  Patient will continue to take his medications as presribed and come to cardiac rehab for exercise  Patient will continue to take his medications as presribed and come to cardiac rehab for exercise        Core Components/Risk Factors/Patient Goals at Discharge (Final Review):  Goals and Risk Factor Review - 10/29/17 1042      Core Components/Risk Factors/Patient Goals Review   Personal Goals Review  Weight Management/Obesity;Lipids;Hypertension;Diabetes    Review  David [name]'s vital signs and CBG's have been stable. Patient has made slow progress due to deconditioning    Expected Outcomes  Patient will continue to take his medications as presribed and come to cardiac rehab for exercise       ITP Comments: ITP Comments    Row Name 08/20/17 1950 09/01/17 1158 09/30/17 0850 10/29/17 1039     ITP Comments  Dr. Fransico Him, Medical Director  30 day ITP review. Patient off to a good start to exercise. will continue to monitor CBG's  30 day ITP review. David [name] has been absent for the past two weeks. Patient has made slow progress when in attendance  30 day ITP review. David [name] returned to exercise at cardiac rehab Patient has made slow progress when in attendance.       Comments: See ITP comments. David [name] has been  stable since his return to exercise at cardiac rehab.Will continue to monitor the patient throughout  the program.Braydn Carneiro Venetia Maxon, RN,BSN 10/29/2017 10:51 AM

## 2017-10-30 ENCOUNTER — Encounter (HOSPITAL_COMMUNITY): Payer: Medicare Other

## 2017-10-30 ENCOUNTER — Encounter (HOSPITAL_COMMUNITY)
Admission: RE | Admit: 2017-10-30 | Discharge: 2017-10-30 | Disposition: A | Discharge status: Home / Self Care | Payer: Medicare Other | Source: Ambulatory Visit | Attending: Cardiology | Admitting: Cardiology

## 2017-10-30 DIAGNOSIS — Z955 Presence of coronary angioplasty implant and graft: Secondary | ICD-10-CM | POA: Diagnosis not present

## 2017-11-02 ENCOUNTER — Encounter (HOSPITAL_COMMUNITY): Payer: Medicare Other

## 2017-11-02 ENCOUNTER — Encounter (HOSPITAL_COMMUNITY)
Admission: RE | Admit: 2017-11-02 | Discharge: 2017-11-02 | Disposition: A | Discharge status: Home / Self Care | Payer: Medicare Other | Source: Ambulatory Visit | Attending: Cardiology | Admitting: Cardiology

## 2017-11-02 DIAGNOSIS — Z955 Presence of coronary angioplasty implant and graft: Secondary | ICD-10-CM

## 2017-11-02 LAB — GLUCOSE, CAPILLARY: GLUCOSE-CAPILLARY: 148 mg/dL — AB (ref 65–99)

## 2017-11-04 ENCOUNTER — Encounter (HOSPITAL_COMMUNITY): Payer: Medicare Other

## 2017-11-04 ENCOUNTER — Encounter (HOSPITAL_COMMUNITY)
Admission: RE | Admit: 2017-11-04 | Discharge: 2017-11-04 | Disposition: A | Discharge status: Home / Self Care | Payer: Medicare Other | Source: Ambulatory Visit | Attending: Cardiology | Admitting: Cardiology

## 2017-11-04 DIAGNOSIS — Z955 Presence of coronary angioplasty implant and graft: Secondary | ICD-10-CM

## 2017-11-04 LAB — GLUCOSE, CAPILLARY
GLUCOSE-CAPILLARY: 144 mg/dL — AB (ref 65–99)
Glucose-Capillary: 59 mg/dL — ABNORMAL LOW (ref 65–99)
Glucose-Capillary: 79 mg/dL (ref 65–99)

## 2017-11-06 ENCOUNTER — Encounter (HOSPITAL_COMMUNITY)
Admission: RE | Admit: 2017-11-06 | Discharge: 2017-11-06 | Disposition: A | Discharge status: Home / Self Care | Payer: Medicare Other | Source: Ambulatory Visit | Attending: Cardiology | Admitting: Cardiology

## 2017-11-06 ENCOUNTER — Encounter (HOSPITAL_COMMUNITY): Payer: Medicare Other

## 2017-11-06 DIAGNOSIS — Z955 Presence of coronary angioplasty implant and graft: Secondary | ICD-10-CM

## 2017-11-06 LAB — GLUCOSE, CAPILLARY: GLUCOSE-CAPILLARY: 196 mg/dL — AB (ref 65–99)

## 2017-11-06 NOTE — Progress Notes (Signed)
[name]'s weight was up from 102.2kg to 105.0kg, a total of 2.8kg, since Wednesday 11/04/17.  [name] does not have any swelling.  Lungs CTA.  O2 sat 95%. [name] currently takes 20mg  furosemide daily.  Dr. Irven Shelling office called and spoke to April, RN.  Dr. Virgina Jock advised for pt to take 40mg  furosemide.  This was a new Rx ordered recently. [name] endorses having picked up new Rx from [facility].  Informed patient to take two 20mg  tablets for a total of 40mg  daily and when he runs out of the 20mg  tablets to only take one 40mg  tablet a day.  Informed pt to take another 20mg  tablet of furosemide today to equal 40mg  total. Pt verbalized understaning.

## 2017-11-09 ENCOUNTER — Encounter (HOSPITAL_COMMUNITY)
Admission: RE | Admit: 2017-11-09 | Discharge: 2017-11-09 | Disposition: A | Discharge status: Home / Self Care | Payer: Medicare Other | Source: Ambulatory Visit | Attending: Cardiology | Admitting: Cardiology

## 2017-11-09 ENCOUNTER — Encounter (HOSPITAL_COMMUNITY): Payer: Medicare Other

## 2017-11-09 DIAGNOSIS — Z955 Presence of coronary angioplasty implant and graft: Secondary | ICD-10-CM

## 2017-11-09 LAB — GLUCOSE, CAPILLARY: Glucose-Capillary: 224 mg/dL — ABNORMAL HIGH (ref 65–99)

## 2017-11-11 ENCOUNTER — Encounter (HOSPITAL_COMMUNITY): Payer: Medicare Other

## 2017-11-11 ENCOUNTER — Encounter (HOSPITAL_COMMUNITY)
Admission: RE | Admit: 2017-11-11 | Discharge: 2017-11-11 | Disposition: A | Discharge status: Home / Self Care | Payer: Medicare Other | Source: Ambulatory Visit | Attending: Cardiology | Admitting: Cardiology

## 2017-11-11 DIAGNOSIS — Z955 Presence of coronary angioplasty implant and graft: Secondary | ICD-10-CM

## 2017-11-11 LAB — GLUCOSE, CAPILLARY: Glucose-Capillary: 148 mg/dL — ABNORMAL HIGH (ref 70–99)

## 2017-11-13 ENCOUNTER — Encounter (HOSPITAL_COMMUNITY): Payer: Medicare Other

## 2017-11-16 ENCOUNTER — Encounter (HOSPITAL_COMMUNITY): Payer: Medicare Other

## 2017-11-16 ENCOUNTER — Encounter (HOSPITAL_COMMUNITY)
Admission: RE | Admit: 2017-11-16 | Discharge: 2017-11-16 | Disposition: A | Discharge status: Home / Self Care | Payer: Medicare Other | Source: Ambulatory Visit | Attending: Cardiology | Admitting: Cardiology

## 2017-11-16 DIAGNOSIS — I509 Heart failure, unspecified: Secondary | ICD-10-CM | POA: Diagnosis not present

## 2017-11-16 DIAGNOSIS — Z955 Presence of coronary angioplasty implant and graft: Secondary | ICD-10-CM | POA: Insufficient documentation

## 2017-11-16 DIAGNOSIS — Z7982 Long term (current) use of aspirin: Secondary | ICD-10-CM | POA: Diagnosis not present

## 2017-11-16 DIAGNOSIS — E119 Type 2 diabetes mellitus without complications: Secondary | ICD-10-CM | POA: Diagnosis not present

## 2017-11-16 DIAGNOSIS — I110 Hypertensive heart disease with heart failure: Secondary | ICD-10-CM | POA: Diagnosis not present

## 2017-11-16 DIAGNOSIS — Z79899 Other long term (current) drug therapy: Secondary | ICD-10-CM | POA: Insufficient documentation

## 2017-11-16 LAB — GLUCOSE, CAPILLARY: Glucose-Capillary: 157 mg/dL — ABNORMAL HIGH (ref 70–99)

## 2017-11-18 ENCOUNTER — Encounter (HOSPITAL_COMMUNITY): Payer: Medicare Other

## 2017-11-18 MED FILL — Calcium Gluconate Gel 2.5%: TOPICAL | Qty: 30 | Status: AC

## 2017-11-20 ENCOUNTER — Encounter (HOSPITAL_COMMUNITY): Payer: Medicare Other

## 2017-11-23 ENCOUNTER — Encounter (HOSPITAL_COMMUNITY): Payer: Medicare Other

## 2017-11-24 ENCOUNTER — Encounter (HOSPITAL_COMMUNITY): Payer: Self-pay | Admitting: Emergency Medicine

## 2017-11-24 ENCOUNTER — Emergency Department (HOSPITAL_COMMUNITY): Payer: Medicare Other

## 2017-11-24 ENCOUNTER — Other Ambulatory Visit: Payer: Self-pay

## 2017-11-24 ENCOUNTER — Inpatient Hospital Stay (HOSPITAL_COMMUNITY)
Admission: EM | Admit: 2017-11-24 | Discharge: 2017-12-09 | DRG: 853 | Disposition: A | Discharge status: Skilled Nursing Facility | Payer: Medicare Other | Attending: Family Medicine | Admitting: Family Medicine

## 2017-11-24 ENCOUNTER — Inpatient Hospital Stay (HOSPITAL_COMMUNITY): Payer: Medicare Other

## 2017-11-24 DIAGNOSIS — D631 Anemia in chronic kidney disease: Secondary | ICD-10-CM | POA: Diagnosis present

## 2017-11-24 DIAGNOSIS — Z96641 Presence of right artificial hip joint: Secondary | ICD-10-CM | POA: Diagnosis present

## 2017-11-24 DIAGNOSIS — K630 Abscess of intestine: Secondary | ICD-10-CM | POA: Diagnosis present

## 2017-11-24 DIAGNOSIS — E876 Hypokalemia: Secondary | ICD-10-CM | POA: Diagnosis not present

## 2017-11-24 DIAGNOSIS — E1122 Type 2 diabetes mellitus with diabetic chronic kidney disease: Secondary | ICD-10-CM

## 2017-11-24 DIAGNOSIS — N183 Chronic kidney disease, stage 3 unspecified: Secondary | ICD-10-CM | POA: Diagnosis present

## 2017-11-24 DIAGNOSIS — W19XXXA Unspecified fall, initial encounter: Secondary | ICD-10-CM

## 2017-11-24 DIAGNOSIS — D62 Acute posthemorrhagic anemia: Secondary | ICD-10-CM | POA: Diagnosis not present

## 2017-11-24 DIAGNOSIS — N170 Acute kidney failure with tubular necrosis: Secondary | ICD-10-CM | POA: Diagnosis present

## 2017-11-24 DIAGNOSIS — K3580 Unspecified acute appendicitis: Secondary | ICD-10-CM

## 2017-11-24 DIAGNOSIS — E860 Dehydration: Secondary | ICD-10-CM | POA: Diagnosis present

## 2017-11-24 DIAGNOSIS — E119 Type 2 diabetes mellitus without complications: Secondary | ICD-10-CM

## 2017-11-24 DIAGNOSIS — Z955 Presence of coronary angioplasty implant and graft: Secondary | ICD-10-CM | POA: Diagnosis not present

## 2017-11-24 DIAGNOSIS — A419 Sepsis, unspecified organism: Principal | ICD-10-CM | POA: Diagnosis present

## 2017-11-24 DIAGNOSIS — M25552 Pain in left hip: Secondary | ICD-10-CM | POA: Diagnosis present

## 2017-11-24 DIAGNOSIS — Z79899 Other long term (current) drug therapy: Secondary | ICD-10-CM

## 2017-11-24 DIAGNOSIS — K6289 Other specified diseases of anus and rectum: Secondary | ICD-10-CM | POA: Diagnosis present

## 2017-11-24 DIAGNOSIS — Z794 Long term (current) use of insulin: Secondary | ICD-10-CM

## 2017-11-24 DIAGNOSIS — Z01810 Encounter for preprocedural cardiovascular examination: Secondary | ICD-10-CM | POA: Diagnosis present

## 2017-11-24 DIAGNOSIS — N184 Chronic kidney disease, stage 4 (severe): Secondary | ICD-10-CM | POA: Diagnosis present

## 2017-11-24 DIAGNOSIS — Z7982 Long term (current) use of aspirin: Secondary | ICD-10-CM

## 2017-11-24 DIAGNOSIS — E1140 Type 2 diabetes mellitus with diabetic neuropathy, unspecified: Secondary | ICD-10-CM | POA: Diagnosis present

## 2017-11-24 DIAGNOSIS — E1165 Type 2 diabetes mellitus with hyperglycemia: Secondary | ICD-10-CM | POA: Diagnosis present

## 2017-11-24 DIAGNOSIS — M549 Dorsalgia, unspecified: Secondary | ICD-10-CM | POA: Diagnosis present

## 2017-11-24 DIAGNOSIS — E11319 Type 2 diabetes mellitus with unspecified diabetic retinopathy without macular edema: Secondary | ICD-10-CM | POA: Diagnosis present

## 2017-11-24 DIAGNOSIS — R809 Proteinuria, unspecified: Secondary | ICD-10-CM | POA: Diagnosis present

## 2017-11-24 DIAGNOSIS — G8929 Other chronic pain: Secondary | ICD-10-CM | POA: Diagnosis present

## 2017-11-24 DIAGNOSIS — E870 Hyperosmolality and hypernatremia: Secondary | ICD-10-CM | POA: Diagnosis not present

## 2017-11-24 DIAGNOSIS — E785 Hyperlipidemia, unspecified: Secondary | ICD-10-CM | POA: Diagnosis present

## 2017-11-24 DIAGNOSIS — I2510 Atherosclerotic heart disease of native coronary artery without angina pectoris: Secondary | ICD-10-CM | POA: Diagnosis present

## 2017-11-24 DIAGNOSIS — I10 Essential (primary) hypertension: Secondary | ICD-10-CM | POA: Diagnosis present

## 2017-11-24 DIAGNOSIS — H409 Unspecified glaucoma: Secondary | ICD-10-CM | POA: Diagnosis present

## 2017-11-24 DIAGNOSIS — K219 Gastro-esophageal reflux disease without esophagitis: Secondary | ICD-10-CM | POA: Diagnosis present

## 2017-11-24 DIAGNOSIS — N179 Acute kidney failure, unspecified: Secondary | ICD-10-CM

## 2017-11-24 DIAGNOSIS — K37 Unspecified appendicitis: Secondary | ICD-10-CM | POA: Diagnosis present

## 2017-11-24 DIAGNOSIS — K381 Appendicular concretions: Secondary | ICD-10-CM | POA: Diagnosis present

## 2017-11-24 DIAGNOSIS — K3533 Acute appendicitis with perforation and localized peritonitis, with abscess: Secondary | ICD-10-CM | POA: Diagnosis present

## 2017-11-24 DIAGNOSIS — Z7902 Long term (current) use of antithrombotics/antiplatelets: Secondary | ICD-10-CM

## 2017-11-24 DIAGNOSIS — I959 Hypotension, unspecified: Secondary | ICD-10-CM | POA: Diagnosis not present

## 2017-11-24 DIAGNOSIS — N289 Disorder of kidney and ureter, unspecified: Secondary | ICD-10-CM

## 2017-11-24 DIAGNOSIS — Z833 Family history of diabetes mellitus: Secondary | ICD-10-CM

## 2017-11-24 HISTORY — DX: Chronic kidney disease, unspecified: N18.9

## 2017-11-24 HISTORY — DX: Type 2 diabetes mellitus with unspecified diabetic retinopathy without macular edema: E11.319

## 2017-11-24 HISTORY — DX: Atherosclerotic heart disease of native coronary artery without angina pectoris: I25.10

## 2017-11-24 LAB — URINALYSIS, ROUTINE W REFLEX MICROSCOPIC
Bilirubin Urine: NEGATIVE
Glucose, UA: NEGATIVE mg/dL
KETONES UR: NEGATIVE mg/dL
Leukocytes, UA: NEGATIVE
Nitrite: NEGATIVE
PH: 5 (ref 5.0–8.0)
PROTEIN: 100 mg/dL — AB
Specific Gravity, Urine: 1.017 (ref 1.005–1.030)

## 2017-11-24 LAB — COMPREHENSIVE METABOLIC PANEL
ALBUMIN: 2.7 g/dL — AB (ref 3.5–5.0)
ALK PHOS: 94 U/L (ref 38–126)
ALT: 33 U/L (ref 0–44)
AST: 39 U/L (ref 15–41)
Anion gap: 10 (ref 5–15)
BUN: 48 mg/dL — ABNORMAL HIGH (ref 8–23)
CALCIUM: 8.5 mg/dL — AB (ref 8.9–10.3)
CO2: 25 mmol/L (ref 22–32)
CREATININE: 2.53 mg/dL — AB (ref 0.61–1.24)
Chloride: 102 mmol/L (ref 98–111)
GFR calc Af Amer: 28 mL/min — ABNORMAL LOW (ref >60)
GFR calc non Af Amer: 24 mL/min — ABNORMAL LOW (ref >60)
GLUCOSE: 246 mg/dL — AB (ref 70–99)
Potassium: 3.4 mmol/L — ABNORMAL LOW (ref 3.5–5.1)
SODIUM: 137 mmol/L (ref 135–145)
Total Bilirubin: 0.7 mg/dL (ref 0.3–1.2)
Total Protein: 6.9 g/dL (ref 6.5–8.1)

## 2017-11-24 LAB — CBC
HCT: 30.3 % — ABNORMAL LOW (ref 39.0–52.0)
Hemoglobin: 9.6 g/dL — ABNORMAL LOW (ref 13.0–17.0)
MCH: 33.1 pg (ref 26.0–34.0)
MCHC: 31.7 g/dL (ref 30.0–36.0)
MCV: 104.5 fL — AB (ref 78.0–100.0)
PLATELETS: 278 10*3/uL (ref 150–400)
RBC: 2.9 MIL/uL — ABNORMAL LOW (ref 4.22–5.81)
RDW: 11.5 % (ref 11.5–15.5)
WBC: 16.9 10*3/uL — ABNORMAL HIGH (ref 4.0–10.5)

## 2017-11-24 LAB — LIPASE, BLOOD: LIPASE: 24 U/L (ref 11–51)

## 2017-11-24 LAB — GLUCOSE, CAPILLARY
GLUCOSE-CAPILLARY: 63 mg/dL — AB (ref 70–99)
GLUCOSE-CAPILLARY: 85 mg/dL (ref 70–99)

## 2017-11-24 MED ORDER — HYDRALAZINE HCL 20 MG/ML IJ SOLN
5.0000 mg | INTRAMUSCULAR | Status: DC | PRN
  Administered 2017-11-24 – 2017-12-05 (×4): 5 mg via INTRAVENOUS
  Filled 2017-11-24 (×4): qty 1

## 2017-11-24 MED ORDER — AMLODIPINE BESYLATE 5 MG PO TABS
5.0000 mg | ORAL_TABLET | Freq: Every day | ORAL | Status: DC
  Administered 2017-11-25 – 2017-11-26 (×2): 5 mg via ORAL
  Filled 2017-11-24 (×2): qty 1

## 2017-11-24 MED ORDER — INSULIN ASPART 100 UNIT/ML ~~LOC~~ SOLN: 0.0000 [IU] | Freq: Every day | SUBCUTANEOUS | Status: DC

## 2017-11-24 MED ORDER — ACETAMINOPHEN 325 MG PO TABS
650.0000 mg | ORAL_TABLET | Freq: Four times a day (QID) | ORAL | Status: DC | PRN
  Administered 2017-11-25 – 2017-11-30 (×5): 650 mg via ORAL
  Filled 2017-11-24 (×9): qty 2

## 2017-11-24 MED ORDER — ISOSORB DINITRATE-HYDRALAZINE 20-37.5 MG PO TABS
1.0000 | ORAL_TABLET | Freq: Three times a day (TID) | ORAL | Status: DC
  Administered 2017-11-24 – 2017-12-08 (×36): 1 via ORAL
  Filled 2017-11-24 (×42): qty 1

## 2017-11-24 MED ORDER — INSULIN DETEMIR 100 UNIT/ML ~~LOC~~ SOLN
42.0000 [IU] | Freq: Every day | SUBCUTANEOUS | Status: DC
  Administered 2017-11-25 – 2017-11-26 (×2): 42 [IU] via SUBCUTANEOUS
  Filled 2017-11-24 (×3): qty 0.42

## 2017-11-24 MED ORDER — PIPERACILLIN-TAZOBACTAM 3.375 G IVPB
3.3750 g | Freq: Three times a day (TID) | INTRAVENOUS | Status: DC
  Administered 2017-11-25 – 2017-11-28 (×10): 3.375 g via INTRAVENOUS
  Filled 2017-11-24 (×12): qty 50

## 2017-11-24 MED ORDER — ALPRAZOLAM 0.5 MG PO TABS
1.0000 mg | ORAL_TABLET | Freq: Two times a day (BID) | ORAL | Status: DC | PRN
  Administered 2017-11-24: 0.5 mg via ORAL
  Administered 2017-11-25: 1 mg via ORAL
  Administered 2017-11-26 – 2017-11-29 (×2): 0.5 mg via ORAL
  Administered 2017-11-29 – 2017-12-09 (×7): 1 mg via ORAL
  Filled 2017-11-24 (×2): qty 2
  Filled 2017-11-24: qty 4
  Filled 2017-11-24 (×3): qty 2
  Filled 2017-11-24: qty 4
  Filled 2017-11-24 (×5): qty 2

## 2017-11-24 MED ORDER — DORZOLAMIDE HCL-TIMOLOL MAL 2-0.5 % OP SOLN
1.0000 [drp] | Freq: Two times a day (BID) | OPHTHALMIC | Status: DC
  Administered 2017-11-24 – 2017-12-09 (×27): 1 [drp] via OPHTHALMIC
  Filled 2017-11-24: qty 10

## 2017-11-24 MED ORDER — CEFTRIAXONE SODIUM 2 G IJ SOLR
2.0000 g | Freq: Once | INTRAMUSCULAR | Status: AC
  Administered 2017-11-24: 2 g via INTRAVENOUS
  Filled 2017-11-24: qty 20

## 2017-11-24 MED ORDER — FLUTICASONE PROPIONATE 50 MCG/ACT NA SUSP
1.0000 | Freq: Every day | NASAL | Status: DC
  Administered 2017-11-29 – 2017-12-09 (×4): 1 via NASAL
  Filled 2017-11-24: qty 16

## 2017-11-24 MED ORDER — ONDANSETRON HCL 4 MG/2ML IJ SOLN
4.0000 mg | Freq: Four times a day (QID) | INTRAMUSCULAR | Status: DC | PRN
  Administered 2017-12-01 – 2017-12-04 (×2): 4 mg via INTRAVENOUS
  Filled 2017-11-24 (×2): qty 2

## 2017-11-24 MED ORDER — INSULIN ASPART 100 UNIT/ML ~~LOC~~ SOLN
0.0000 [IU] | Freq: Three times a day (TID) | SUBCUTANEOUS | Status: DC
  Administered 2017-11-25: 3 [IU] via SUBCUTANEOUS

## 2017-11-24 MED ORDER — METOPROLOL SUCCINATE ER 100 MG PO TB24
100.0000 mg | ORAL_TABLET | Freq: Every day | ORAL | Status: DC
  Administered 2017-11-25 – 2017-12-09 (×15): 100 mg via ORAL
  Filled 2017-11-24 (×16): qty 1

## 2017-11-24 MED ORDER — GABAPENTIN 300 MG PO CAPS
600.0000 mg | ORAL_CAPSULE | Freq: Two times a day (BID) | ORAL | Status: DC
  Administered 2017-11-24 – 2017-11-26 (×4): 600 mg via ORAL
  Filled 2017-11-24 (×4): qty 2

## 2017-11-24 MED ORDER — LACTATED RINGERS IV SOLN
INTRAVENOUS | Status: DC
  Administered 2017-11-24 – 2017-11-25 (×4): via INTRAVENOUS

## 2017-11-24 MED ORDER — ONDANSETRON HCL 4 MG PO TABS: 4.0000 mg | ORAL_TABLET | Freq: Four times a day (QID) | ORAL | Status: DC | PRN

## 2017-11-24 MED ORDER — METRONIDAZOLE IN NACL 5-0.79 MG/ML-% IV SOLN
500.0000 mg | Freq: Once | INTRAVENOUS | Status: AC
  Administered 2017-11-24: 500 mg via INTRAVENOUS
  Filled 2017-11-24: qty 100

## 2017-11-24 MED ORDER — SODIUM CHLORIDE 0.9 % IV SOLN
Freq: Once | INTRAVENOUS | Status: AC
  Administered 2017-11-24: 14:00:00 via INTRAVENOUS

## 2017-11-24 MED ORDER — MORPHINE SULFATE (PF) 4 MG/ML IV SOLN
2.0000 mg | INTRAVENOUS | Status: DC | PRN
  Administered 2017-11-24 – 2017-11-25 (×8): 2 mg via INTRAVENOUS
  Filled 2017-11-24 (×9): qty 1

## 2017-11-24 MED ORDER — ATORVASTATIN CALCIUM 40 MG PO TABS
40.0000 mg | ORAL_TABLET | Freq: Every day | ORAL | Status: DC
  Administered 2017-11-24 – 2017-12-08 (×15): 40 mg via ORAL
  Filled 2017-11-24 (×15): qty 1

## 2017-11-24 MED ORDER — PIPERACILLIN-TAZOBACTAM 3.375 G IVPB 30 MIN
3.3750 g | Freq: Once | INTRAVENOUS | Status: AC
  Administered 2017-11-24: 3.375 g via INTRAVENOUS
  Filled 2017-11-24: qty 50

## 2017-11-24 MED ORDER — ACETAMINOPHEN 650 MG RE SUPP: 650.0000 mg | Freq: Four times a day (QID) | RECTAL | Status: DC | PRN

## 2017-11-24 NOTE — ED Notes (Signed)
MD paged regarding BP parameters for hydralazine administration

## 2017-11-24 NOTE — H&P (Signed)
History and Physical    David [name] RWE:[id number] DOB: [birth year] DOA: 11/24/2017  PCP: [name], MD Consultants:  [name] - cardiology; Buddy Duty - endocrinology Patient coming from:  Home - lives with wife; NOK: Wife, [phone]  Chief Complaint:  Abdominal pain  HPI: David [name] is a 72 y.o. male with medical history significant of  Stage 2 CKD: HTN; glaucoma; DM; CAD s/p stenting in 11/18; and h/o CHF with normal Echo in 11/18 presenting with abdominal pain, found to have appendicitis. He has been having "these urges to go to the restroom" every day about 3 AM.  He feels like he really needs to go, wakes him from sleep, "but nothing happens.  Not even air".  He had been constipated but then had some diarrhea.  And then he had no appetite and then vomited on Saturday.  No fever.  Periumbilical pain.  He has been waking up his wife every night with the episodes and she finally made him come to the ER.   ED Course:   Appendicitis.  Also with worsening creatinine, glucose 248.  Review of Systems: As per HPI; otherwise review of systems reviewed and negative.   Ambulatory Status:  Ambulates with a cane   Past Medical History:  Diagnosis Date  . Arthritis   . CAD in native artery    s/p stent in 11/18  . CHF (congestive heart failure) ([place])    normal echo in 11/18  . CKD (chronic kidney disease)   . DDD (degenerative disc disease), cervical   . Diabetes mellitus without complication ([place])    type II  . Diabetic neuropathy ([place])   . Diabetic retinopathy ([place])   . GERD (gastroesophageal reflux disease)   . Glaucoma   . Hypertension   . Renal disorder    Stage II    Past Surgical History:  Procedure Laterality Date  . AMPUTATION Right 07/09/2016   Procedure: Right Great Toe Amputation at Metatarsophalangeal Joint;  Surgeon: [name], MD;  Location: [facility];  Service: Orthopedics;  Laterality: Right;  . BACK SURGERY     4  . CARDIAC CATHETERIZATION    . CORONARY  STENT INTERVENTION N/A 04/06/2017   Procedure: CORONARY STENT INTERVENTION;  Surgeon: Nigel Mormon, MD;  Location: [facility];  Service: Cardiovascular;  Laterality: N/A;  . CORONARY STENT INTERVENTION N/A 04/07/2017   Procedure: CORONARY STENT INTERVENTION;  Surgeon: Nigel Mormon, MD;  Location: [facility];  Service: Cardiovascular;  Laterality: N/A;  . CYST EXCISION     on Back  . JOINT REPLACEMENT    . RIGHT/LEFT HEART CATH AND CORONARY ANGIOGRAPHY N/A 04/06/2017   Procedure: RIGHT/LEFT HEART CATH AND CORONARY ANGIOGRAPHY;  Surgeon: Nigel Mormon, MD;  Location: [facility];  Service: Cardiovascular;  Laterality: N/A;  . TOTAL HIP ARTHROPLASTY     Right     Social History   Socioeconomic History  . Marital status: Married    Spouse name: Not on file  . Number of children: 2  . Years of education: 38  . Highest education level: Master's degree (e.g., MA, MS, MEng, MEd, MSW, MBA)  Occupational History  . Occupation: Retired  Scientific laboratory technician  . Financial resource strain: Not hard at all  . Food insecurity:    Worry: Not on file    Inability: Not on file  . Transportation needs:    Medical: Not on file    Non-medical: Not on file  Tobacco Use  . Smoking status: Never Smoker  . Smokeless tobacco: Never Used  Substance and Sexual Activity  . Alcohol use: Not Currently  . Drug use: No  . Sexual activity: Yes  Lifestyle  . Physical activity:    Days per week: 0 days    Minutes per session: 0 min  . Stress: Not at all  Relationships  . Social connections:    Talks on phone: Not on file    Gets together: Not on file    Attends religious service: Not on file    Active member of club or organization: Not on file    Attends meetings of clubs or organizations: Not on file    Relationship status: Not on file  . Intimate partner violence:    Fear of current or ex partner: Not on file    Emotionally abused: Not on file    Physically  abused: Not on file    Forced sexual activity: Not on file  Other Topics Concern  . Not on file  Social History Narrative  . Not on file    No Known Allergies  Family History  Problem Relation Age of Onset  . Diabetes Other   . Hyperlipidemia Other   . Hypertension Other   . Stroke Other   . Alzheimer's disease Other   . Thyroid disease Mother   . Diabetes Mellitus II Father     Prior to Admission medications   Medication Sig Start Date End Date Taking? Authorizing Provider  albuterol (PROVENTIL HFA;VENTOLIN HFA) 108 (90 Base) MCG/ACT inhaler Inhale 2 puffs into the lungs every 6 (six) hours as needed for wheezing or shortness of breath. 04/09/17   Barton Dubois, MD  ALPRAZolam Duanne Moron) 1 MG tablet TAKE 1 TABLET BY MOUTH UP TO TWICE DAILY AS NEEDED FOR ANXEITY 06/17/16   [provider]  amLODipine (NORVASC) 5 MG tablet Take 1 tablet (5 mg total) by mouth daily. 04/10/17   Barton Dubois, MD  aspirin EC 81 MG tablet Take 81 mg by mouth daily.    [provider]  atorvastatin (LIPITOR) 40 MG tablet Take 40 mg by mouth at bedtime. 08/01/16   [provider]  clopidogrel (PLAVIX) 75 MG tablet Take 1 tablet (75 mg total) by mouth daily. 04/10/17   Barton Dubois, MD  docusate sodium (COLACE) 100 MG capsule Take 100 mg by mouth daily.    [provider]  fluticasone (FLONASE) 50 MCG/ACT nasal spray Place 1 spray into both nostrils daily. 04/09/17   Barton Dubois, MD  furosemide (LASIX) 20 MG tablet Take 1 tablet (20 mg total) by mouth daily. Take twice daily for 5 days, and then daily after that 04/09/17   Barton Dubois, MD  gabapentin (NEURONTIN) 600 MG tablet Take 600 mg by mouth 2 (two) times daily.  01/31/15   [provider]  HYDROcodone-acetaminophen (NORCO) 10-325 MG tablet Take 1 tablet by mouth 2 (two) times daily as needed for moderate pain.  03/19/16   [provider]  Insulin Detemir (LEVEMIR FLEXTOUCH) 100 UNIT/ML Pen  Inject 40 Units into the skin daily. Patient taking differently: Inject 42 Units into the skin daily.  04/09/17   Barton Dubois, MD  iron polysaccharides (NIFEREX) 150 MG capsule Take 1 capsule (150 mg total) by mouth daily. 04/09/17   Barton Dubois, MD  isosorbide-hydrALAZINE (BIDIL) 20-37.5 MG tablet Take 1 tablet by mouth 3 (three) times daily. 09/08/16   Ghimire, Henreitta Leber, MD  LUMIGAN 0.01 % SOLN  Place 1 drop into both eyes at bedtime. 07/28/16   [provider]  methocarbamol (ROBAXIN) 750 MG tablet Take 1 tablet (750 mg total) by mouth every 6 (six) hours as needed for muscle spasms. 09/08/16   Ghimire, Henreitta Leber, MD  metoprolol succinate (TOPROL-XL) 100 MG 24 hr tablet Take 1 tablet (100 mg total) by mouth daily. Take with or immediately following a meal. 04/10/17   Barton Dubois, MD    Physical Exam: Vitals:   11/24/17 1830 11/24/17 1845 11/24/17 1849 11/24/17 1900  BP: (!) 174/80 (!) 176/78 (!) 176/78 (!) 169/80  Pulse: 65 67  69  Resp:      Temp:      TempSrc:      SpO2: 96% 95%  96%  Weight:      Height:         General:  Appears calm and comfortable and is NAD Eyes:   EOMI, normal lids, iris ENT:  grossly normal hearing, lips & tongue, mmm Neck:  no LAD, masses or thyromegaly; no carotid bruits Cardiovascular:  RRR, no m/r/g. No LE edema.  Respiratory:   CTA bilaterally with no wheezes/rales/rhonchi.  Normal respiratory effort. Abdomen:  soft, generally NT with mild periumbilical TTP, ND, NABS Back:   normal alignment, no CVAT Skin:  no rash or induration seen on limited exam Musculoskeletal:  grossly normal tone BUE/BLE, good ROM, no bony abnormality Psychiatric:  grossly normal mood and affect, speech fluent and appropriate, AOx3 Neurologic:  CN 2-12 grossly intact, moves all extremities in coordinated fashion, sensation intact    Radiological Exams on Admission: Ct Abdomen Pelvis Wo Contrast  Result Date: 11/24/2017 CLINICAL DATA:  Abdominal and  periumbilical pain. No bowel movement for 1 week. EXAM: CT ABDOMEN AND PELVIS WITHOUT CONTRAST TECHNIQUE: Multidetector CT imaging of the abdomen and pelvis was performed following the standard protocol without IV contrast. COMPARISON:  February 13, 2015 FINDINGS: Lower chest: No acute abnormality. Hepatobiliary: No focal liver abnormality is seen. No gallstones, gallbladder wall thickening, or biliary dilatation. Pancreas: Unremarkable. No pancreatic ductal dilatation or surrounding inflammatory changes. Spleen: Normal in size without focal abnormality. Adrenals/Urinary Tract: Adrenal glands are unremarkable. Kidneys are normal, without renal calculi, focal lesion, or hydronephrosis. Bladder is unremarkable. Stomach/Bowel: The stomach and small bowel are normal. The appendix is markedly abnormal with a distal appendicolith and distal dilatation measuring up to 2 cm. No extraluminal gas is noted. It would be difficult to confidently exclude a small extraluminal fluid collection but none is definitively seen. There is wall thickening and periappendiceal stranding. There is wall thickening in the cecum near the takeoff of the appendix which may be secondary to the markedly inflamed appendix. The remainder of the colon is unremarkable. Vascular/Lymphatic: Minimal atherosclerosis is seen in the nonaneurysmal aorta. No adenopathy. Reproductive: Prostate is unremarkable. Other: No abdominal wall hernia or abnormality. No abdominopelvic ascites. Musculoskeletal: Status post right hip replacement. Degenerative changes in the visualized spine. IMPRESSION: 1. Appendicitis with marked dilatation, wall thickening, and periappendiceal stranding. There is an appendicolith. Evaluation for a small extraluminal fluid collection would be limited without contrast but none are seen. Appendix: Location: Extends from the right lower quadrant of the abdomen into the right side of the pelvis. Abuts the sigmoid colon. Diameter: 2 cm  Appendicolith: There is a single appendicoliths. Mucosal hyper-enhancement: Not assessed without contrast. Extraluminal gas: None identified. Periappendiceal collection: Difficult to evaluate without contrast but none seen. 2. Minimal atherosclerosis in the nonaneurysmal aorta. Electronically Signed   By:  Dorise Bullion III M.D   On: 11/24/2017 13:18   Dg Chest Port 1 View  Result Date: 11/24/2017 CLINICAL DATA:  Preop cardiovascular exam. EXAM: PORTABLE CHEST 1 VIEW COMPARISON:  03/30/2017 FINDINGS: Cardiomegaly with aortic atherosclerosis. Coronary arterial stent projects over the left heart. Lungs are clear without pulmonary consolidation, overt pulmonary edema, effusion or pneumothorax. Lower cervical degenerative disc and facet arthropathy. No acute osseous abnormality. IMPRESSION: Cardiomegaly with aortic atherosclerosis. No active pulmonary disease. Electronically Signed   By: Ashley Royalty M.D.   On: 11/24/2017 18:20    EKG: Independently reviewed.  NSR with rate 71; nonspecific ST changes with no evidence of acute ischemia; prolonged QT 501   Labs on Admission: I have personally reviewed the available labs and imaging studies at the time of the admission.  Pertinent labs:   Glucose 246 BUN 48/Creatinine 2.53/GFR 28; 25/1.63/48 on 11/22 WBC 16.9 Hgb 9.6  Assessment/Plan Principal Problem:   Appendicitis Active Problems:   Hypertension   Diabetes mellitus ([place])   Status post coronary artery stent placement   Acute renal failure superimposed on stage 3 chronic kidney disease (HCC)   Appendicitis -Patient presenting with abdominal pain, found to have appendicitis with an appendicolith. -Needs appendectomy -Timing and management as per surgery -Clear liquids for now, NPO after MN  CAD with recent stent -He has been recommended to continue ASA lifelong and Plavix for at least 1 year -If possible to operate through Plavix, that is preferred -I have held the AM dose and made the  patient NPO in case surgery thinks it is reasonable to operate at that time -Intraperitoneal surgery is associated with an intermediate (1-5%) cardiovascular risk for cardiac death and nonfatal MI -With his h/o insulin-requiring DM and CAD in conjunction with his current creatinine, his revised cardiac index gives a risk estimate of >11%.  This decreases to 6.6% if his creatinine improves to prior level first. -Because of this risk, he is recommended to have pre-operative EKG testing prior to surgery; this was done in the ER and will be repeated in the AM to reassess QT interval -His Detsky's Modified Cardiac Risk Index score is Class I, with a low cardiac risk -His diabetes is an intermediate clinical predictor, but given his moderate functional capacity, it is reasonable for him to go to the OR without additional evaluation assuming improved creatinine in the AM and willingness to operate just over 24 hours since last Plavix (he last took at 0700 this AM) -Continue Bidil  Acute renal failure on CKD -Likely due to prerenal failure secondary to dehydration and continuation of Metolazone (he stopped taking Lasix but has continued with every other day Zaroxolyn, even while not taking good PO). -Hold diuretics for now -IVF  -Follow up renal function by BMP -Avoid nephrotoxic agents  HTN -Continue Norvasc, Toprol XL and Bidil  DM -Will check A1c, was 7.2 in 11/18 -Continue Levemir -Cover with moderate-scale SSI    DVT prophylaxis: SCDs Code Status:  Full - confirmed with patient/family Family Communication: Wife present throughout evaluation  Disposition Plan:  Home once clinically improved Consults called: Surgery  Admission status: Admit - It is my clinical opinion that admission to INPATIENT is reasonable and necessary because of the expectation that this patient will require hospital care that crosses at least 2 midnights to treat this condition based on the medical complexity of the  problems presented.  Given the aforementioned information, the predictability of an adverse outcome is felt to be significant.  Karmen Bongo MD [facility]  If note is complete, please contact covering daytime or nighttime physician. [email or web address] Password [email or web address]  11/24/2017, 7:05 PM

## 2017-11-24 NOTE — ED Provider Notes (Signed)
[facility] EMERGENCY DEPARTMENT Provider Note   CSN: [id number] Arrival date & time: 11/24/17  7169     History   Chief Complaint Chief Complaint  Patient presents with  . Abdominal Pain    HPI David [name] is a 72 y.o. male.  HPI David [name] is a 72 y.o. male with history of hypertension, diabetes, heart failure, CHF, presents to emergency department complaining of abdominal pain.  Patient states he has had periumbilical and lower abdominal pain for the last week.  He states he has also had difficulty having a bowel movement.  Last normal bowel movement was 1 week ago.  He states he feels pressure in his rectum.  He reports associated nausea and vomiting yesterday.  He has had decreased appetite.  Denies any prior abdominal surgeries.  Tried Pepto-Bismol but it did not help.  Denies any fever or chills.  No urinary symptoms.  No history of any abdominal surgeries in the past.  Past Medical History:  Diagnosis Date  . Arthritis   . CHF (congestive heart failure) ([place])   . DDD (degenerative disc disease), cervical   . Diabetes mellitus without complication ([place])    type II  . Diabetic neuropathy ([place])   . GERD (gastroesophageal reflux disease)   . Glaucoma   . Hypertension   . Renal disorder    Stage II    Patient Active Problem List   Diagnosis Date Noted  . Acute on chronic congestive heart failure ([place])   . Status post coronary artery stent placement   . Acute renal failure superimposed on stage 3 chronic kidney disease ([place])   . Pneumonia 03/30/2017  . Insulin-requiring or dependent type II diabetes mellitus ([place]) 03/29/2017  . Acute hypoxemic respiratory failure ([place]) 03/29/2017  . Acute respiratory failure with hypoxia ([place])   . JVD (jugular venous distension)   . Pulmonary congestion   . Acquired contracture of Achilles tendon, right 08/19/2016  . Amputated great toe, right ([place]) 07/18/2016  . Onychomycosis 05/29/2016  . Diabetic  polyneuropathy associated with type 2 diabetes mellitus ([place]) 04/09/2016  . CKD (chronic kidney disease), stage II 02/07/2015  . Unspecified hereditary and idiopathic peripheral neuropathy 09/15/2013  . Diabetes mellitus ([place]) 09/14/2013  . Malaise 08/14/2013  . Intractable nausea and vomiting 08/14/2013  . Acute gastroenteritis 08/14/2013  . Hypertension 08/14/2013  . Chronic back pain 08/14/2013  . Glaucoma 08/14/2013  . Headache 08/14/2013  . Hypertensive urgency 08/14/2013    Past Surgical History:  Procedure Laterality Date  . AMPUTATION Right 07/09/2016   Procedure: Right Great Toe Amputation at Metatarsophalangeal Joint;  Surgeon: [name], MD;  Location: [facility];  Service: Orthopedics;  Laterality: Right;  . BACK SURGERY     4  . CARDIAC CATHETERIZATION    . CORONARY STENT INTERVENTION N/A 04/06/2017   Procedure: CORONARY STENT INTERVENTION;  Surgeon: Nigel Mormon, MD;  Location: [facility];  Service: Cardiovascular;  Laterality: N/A;  . CORONARY STENT INTERVENTION N/A 04/07/2017   Procedure: CORONARY STENT INTERVENTION;  Surgeon: Nigel Mormon, MD;  Location: [facility];  Service: Cardiovascular;  Laterality: N/A;  . CYST EXCISION     on Back  . JOINT REPLACEMENT    . RIGHT/LEFT HEART CATH AND CORONARY ANGIOGRAPHY N/A 04/06/2017   Procedure: RIGHT/LEFT HEART CATH AND CORONARY ANGIOGRAPHY;  Surgeon: Nigel Mormon, MD;  Location: [facility];  Service: Cardiovascular;  Laterality: N/A;  . TOTAL HIP ARTHROPLASTY  Right         Home Medications    Prior to Admission medications   Medication Sig Start Date End Date Taking? Authorizing Provider  albuterol (PROVENTIL HFA;VENTOLIN HFA) 108 (90 Base) MCG/ACT inhaler Inhale 2 puffs into the lungs every 6 (six) hours as needed for wheezing or shortness of breath. 04/09/17   Barton Dubois, MD  ALPRAZolam Duanne Moron) 1 MG tablet TAKE 1 TABLET BY MOUTH UP TO TWICE DAILY AS NEEDED FOR  ANXEITY 06/17/16   [provider]  amLODipine (NORVASC) 5 MG tablet Take 1 tablet (5 mg total) by mouth daily. 04/10/17   Barton Dubois, MD  aspirin EC 81 MG tablet Take 81 mg by mouth daily.    [provider]  atorvastatin (LIPITOR) 40 MG tablet Take 40 mg by mouth at bedtime. 08/01/16   [provider]  clopidogrel (PLAVIX) 75 MG tablet Take 1 tablet (75 mg total) by mouth daily. 04/10/17   Barton Dubois, MD  docusate sodium (COLACE) 100 MG capsule Take 100 mg by mouth daily.    [provider]  fluticasone (FLONASE) 50 MCG/ACT nasal spray Place 1 spray into both nostrils daily. 04/09/17   Barton Dubois, MD  furosemide (LASIX) 20 MG tablet Take 1 tablet (20 mg total) by mouth daily. Take twice daily for 5 days, and then daily after that 04/09/17   Barton Dubois, MD  gabapentin (NEURONTIN) 600 MG tablet Take 600 mg by mouth 2 (two) times daily.  01/31/15   [provider]  HYDROcodone-acetaminophen (NORCO) 10-325 MG tablet Take 1 tablet by mouth 2 (two) times daily as needed for moderate pain.  03/19/16   [provider]  Insulin Detemir (LEVEMIR FLEXTOUCH) 100 UNIT/ML Pen Inject 40 Units into the skin daily. Patient taking differently: Inject 42 Units into the skin daily.  04/09/17   Barton Dubois, MD  iron polysaccharides (NIFEREX) 150 MG capsule Take 1 capsule (150 mg total) by mouth daily. 04/09/17   Barton Dubois, MD  isosorbide-hydrALAZINE (BIDIL) 20-37.5 MG tablet Take 1 tablet by mouth 3 (three) times daily. 09/08/16   Ghimire, Henreitta Leber, MD  LUMIGAN 0.01 % SOLN Place 1 drop into both eyes at bedtime. 07/28/16   [provider]  methocarbamol (ROBAXIN) 750 MG tablet Take 1 tablet (750 mg total) by mouth every 6 (six) hours as needed for muscle spasms. 09/08/16   Ghimire, Henreitta Leber, MD  metoprolol succinate (TOPROL-XL) 100 MG 24 hr tablet Take 1 tablet (100 mg total) by mouth daily. Take with or immediately following a meal.  04/10/17   Barton Dubois, MD    Family History Family History  Problem Relation Age of Onset  . Diabetes Other   . Hyperlipidemia Other   . Hypertension Other   . Stroke Other   . Alzheimer's disease Other   . Thyroid disease Mother   . Diabetes Mellitus II Father     Social History Social History   Tobacco Use  . Smoking status: Never Smoker  . Smokeless tobacco: Never Used  Substance Use Topics  . Alcohol use: Yes    Comment: occ  . Drug use: No     Allergies   No known allergies   Review of Systems Review of Systems  Constitutional: Negative for chills and fever.  Respiratory: Negative for cough, chest tightness and shortness of breath.   Cardiovascular: Negative for chest pain, palpitations and leg swelling.  Gastrointestinal: Positive for abdominal pain, constipation, nausea and vomiting. Negative for abdominal distention  and diarrhea.  Genitourinary: Negative for dysuria, frequency, hematuria and urgency.  Musculoskeletal: Negative for arthralgias, myalgias, neck pain and neck stiffness.  Skin: Negative for rash.  Allergic/Immunologic: Negative for immunocompromised state.  Neurological: Negative for dizziness, weakness, light-headedness, numbness and headaches.  All other systems reviewed and are negative.    Physical Exam Updated Vital Signs BP (!) 144/63   Pulse 74   Temp 98.4 F (36.9 C)   Resp 16   Ht 6\' 2"  (1.88 m)   Wt 99.8 kg (220 lb)   SpO2 97%   BMI 28.25 kg/m   Physical Exam  Constitutional: He appears well-developed and well-nourished. No distress.  HENT:  Head: Normocephalic and atraumatic.  Eyes: Conjunctivae are normal.  Neck: Neck supple.  Cardiovascular: Normal rate, regular rhythm and normal heart sounds.  Pulmonary/Chest: Effort normal. No respiratory distress. He has no wheezes. He has no rales.  Abdominal: Soft. Bowel sounds are normal. He exhibits no distension. There is tenderness. There is no rebound.  Periumbilical  tenderness, tenderness in the left and right lower quadrants.  No guarding or rebound tenderness  Musculoskeletal: He exhibits no edema.  Neurological: He is alert.  Skin: Skin is warm and dry.  Nursing note and vitals reviewed.    ED Treatments / Results  Labs (all labs ordered are listed, but only abnormal results are displayed) Labs Reviewed  COMPREHENSIVE METABOLIC PANEL - Abnormal; Notable for the following components:      Result Value   Potassium 3.4 (*)    Glucose, Bld 246 (*)    BUN 48 (*)    Creatinine, Ser 2.53 (*)    Calcium 8.5 (*)    Albumin 2.7 (*)    GFR calc non Af Amer 24 (*)    GFR calc Af Amer 28 (*)    All other components within normal limits  CBC - Abnormal; Notable for the following components:   WBC 16.9 (*)    RBC 2.90 (*)    Hemoglobin 9.6 (*)    HCT 30.3 (*)    MCV 104.5 (*)    All other components within normal limits  LIPASE, BLOOD  URINALYSIS, ROUTINE W REFLEX MICROSCOPIC    EKG None  Radiology Ct Abdomen Pelvis Wo Contrast  Result Date: 11/24/2017 CLINICAL DATA:  Abdominal and periumbilical pain. No bowel movement for 1 week. EXAM: CT ABDOMEN AND PELVIS WITHOUT CONTRAST TECHNIQUE: Multidetector CT imaging of the abdomen and pelvis was performed following the standard protocol without IV contrast. COMPARISON:  February 13, 2015 FINDINGS: Lower chest: No acute abnormality. Hepatobiliary: No focal liver abnormality is seen. No gallstones, gallbladder wall thickening, or biliary dilatation. Pancreas: Unremarkable. No pancreatic ductal dilatation or surrounding inflammatory changes. Spleen: Normal in size without focal abnormality. Adrenals/Urinary Tract: Adrenal glands are unremarkable. Kidneys are normal, without renal calculi, focal lesion, or hydronephrosis. Bladder is unremarkable. Stomach/Bowel: The stomach and small bowel are normal. The appendix is markedly abnormal with a distal appendicolith and distal dilatation measuring up to 2 cm. No  extraluminal gas is noted. It would be difficult to confidently exclude a small extraluminal fluid collection but none is definitively seen. There is wall thickening and periappendiceal stranding. There is wall thickening in the cecum near the takeoff of the appendix which may be secondary to the markedly inflamed appendix. The remainder of the colon is unremarkable. Vascular/Lymphatic: Minimal atherosclerosis is seen in the nonaneurysmal aorta. No adenopathy. Reproductive: Prostate is unremarkable. Other: No abdominal wall hernia or abnormality. No abdominopelvic ascites.  Musculoskeletal: Status post right hip replacement. Degenerative changes in the visualized spine. IMPRESSION: 1. Appendicitis with marked dilatation, wall thickening, and periappendiceal stranding. There is an appendicolith. Evaluation for a small extraluminal fluid collection would be limited without contrast but none are seen. Appendix: Location: Extends from the right lower quadrant of the abdomen into the right side of the pelvis. Abuts the sigmoid colon. Diameter: 2 cm Appendicolith: There is a single appendicoliths. Mucosal hyper-enhancement: Not assessed without contrast. Extraluminal gas: None identified. Periappendiceal collection: Difficult to evaluate without contrast but none seen. 2. Minimal atherosclerosis in the nonaneurysmal aorta. Electronically Signed   By: Dorise Bullion III M.D   On: 11/24/2017 13:18    Procedures Procedures (including critical care time)  Medications Ordered in ED Medications - No data to display   Initial Impression / Assessment and Plan / ED Course  I have reviewed the triage vital signs and the nursing notes.  Pertinent labs & imaging results that were available during my care of the patient were reviewed by me and considered in my medical decision making (see chart for details).  Clinical Course as of Nov 24 1508  Tue Nov 24, 2017  1340 CT ABDOMEN PELVIS WO CONTRAST [RH]    Clinical  Course User Index [RH] Rodell Perna, Talitha Givens    Patient with periumbilical and lower abdominal pain for the last week.  He states pain is worse in the right lower quadrant.  Associated anorexia, nausea, episode of emesis.  He is also constipated.  Will check labs, get CT abdomen pelvis to rule out appendicitis/  bowel obstruction/colitis.  Will monitor.   3:10 PM Patient CT scan is positive for appendicitis.  Due to his elevated creatinine, unable to use IV contrast and patient refused oral contrast.  His creatinine today is 2.53.  His last creatinine is 1.8, but 8 months ago.  His glucose elevated here today at 246.  He is anemic, hemoglobin 9.6 but appears to be at baseline.  White blood cell count elevated at 16.9.  Patient has been kept n.p.o.  Antibiotics started.  Spoke with general surgery, asked to admit to medicine due to multiple medical problems.  I spoke with [facility] hospitalist, they will call surgery regarding the admission of this patient.  Vitals:   11/24/17 1003 11/24/17 1004 11/24/17 1418  BP: (!) 144/63  (!) 150/74  Pulse: 74  65  Resp: 16  16  Temp: 98.4 F (36.9 C)  99 F (37.2 C)  TempSrc:   Oral  SpO2: 97%  100%  Weight:  99.8 kg (220 lb)   Height:  6\' 2"  (1.88 m)       Final Clinical Impressions(s) / ED Diagnoses   Final diagnoses:  Acute appendicitis, unspecified acute appendicitis type  Renal insufficiency    ED Discharge Orders    None       Jeannett Senior, PA-C 11/24/17 1547    Sherwood Gambler, MD 11/25/17 1706

## 2017-11-24 NOTE — Consult Note (Addendum)
[facility] Surgery Consult/Admission Note  David [name] 01/02/1946  [id number].    Requesting MD: Dr. Regenia Skeeter Chief Complaint/Reason for Consult: appendicitis  HPI:   Pt is a is a 72 year old male with hypertension, diabetes, Stage 2 CKD, CHF, cardiac cath and stent placement 03/2017 on plavix who presented to the ED with complaints of abdominal pain. Pt states he has been having dull, constant, non radiating pain just below his umbilicus for one week. Associated chills, nausea, and one episode of vomiting yesterday. Pt is also having rectal pain for one month. He states he has not had a BM in one week. He was taking docusate sodium but stopped one week ago. He states he has been having issues with constipation for over one month. Pt took his plavix this am. He has no hx of abdominal surgeries.   CT scan showed appendicitis with marked dilatation, wall thickening, and periappendiceal stranding. There is an appendicolith.  Labs: WBC 16.9, Hg 9.6, Creatinine 2.53, BUN 48   ROS:  Review of Systems  Constitutional: Positive for chills. Negative for diaphoresis and fever.  HENT: Negative for sore throat.   Respiratory: Negative for cough and shortness of breath.   Cardiovascular: Negative for chest pain.  Gastrointestinal: Positive for abdominal pain, constipation, nausea and vomiting. Negative for blood in stool and diarrhea.  Genitourinary: Negative for dysuria and hematuria.  Skin: Negative for rash.  Neurological: Negative for dizziness and loss of consciousness.  All other systems reviewed and are negative.    Family History  Problem Relation Age of Onset  . Diabetes Other   . Hyperlipidemia Other   . Hypertension Other   . Stroke Other   . Alzheimer's disease Other   . Thyroid disease Mother   . Diabetes Mellitus II Father     Past Medical History:  Diagnosis Date  . Arthritis   . CHF (congestive heart failure) ([place])   . DDD (degenerative disc disease),  cervical   . Diabetes mellitus without complication ([name])    type II  . Diabetic neuropathy ([place])   . GERD (gastroesophageal reflux disease)   . Glaucoma   . Hypertension   . Renal disorder    Stage II    Past Surgical History:  Procedure Laterality Date  . AMPUTATION Right 07/09/2016   Procedure: Right Great Toe Amputation at Metatarsophalangeal Joint;  Surgeon: [name], MD;  Location: [facility];  Service: Orthopedics;  Laterality: Right;  . BACK SURGERY     4  . CARDIAC CATHETERIZATION    . CORONARY STENT INTERVENTION N/A 04/06/2017   Procedure: CORONARY STENT INTERVENTION;  Surgeon: Nigel Mormon, MD;  Location: [facility];  Service: Cardiovascular;  Laterality: N/A;  . CORONARY STENT INTERVENTION N/A 04/07/2017   Procedure: CORONARY STENT INTERVENTION;  Surgeon: Nigel Mormon, MD;  Location: [facility];  Service: Cardiovascular;  Laterality: N/A;  . CYST EXCISION     on Back  . JOINT REPLACEMENT    . RIGHT/LEFT HEART CATH AND CORONARY ANGIOGRAPHY N/A 04/06/2017   Procedure: RIGHT/LEFT HEART CATH AND CORONARY ANGIOGRAPHY;  Surgeon: Nigel Mormon, MD;  Location: [facility];  Service: Cardiovascular;  Laterality: N/A;  . TOTAL HIP ARTHROPLASTY     Right     Social History:  reports that he has never smoked. He has never used smokeless tobacco. He reports that he drinks alcohol. He reports that he does not use drugs.  Allergies:  Allergies  Allergen Reactions  . No Known Allergies      (Not in a hospital admission)  Blood pressure (!) 150/74, pulse 65, temperature 99 F (37.2 C), temperature source Oral, resp. rate 16, height '6\' 2"'  (1.88 m), weight 99.8 kg (220 lb), SpO2 100 %.  Physical Exam  Constitutional: He is oriented to person, place, and time. He appears well-developed and well-nourished.  Non-toxic appearance. He does not appear ill. No distress.  HENT:  Head: Normocephalic and atraumatic.  Nose: Nose normal.   Mouth/Throat: Mucous membranes are normal.  Eyes: Conjunctivae and lids are normal. Right eye exhibits no discharge. Left eye exhibits no discharge. No scleral icterus.  Pupils are equal and round  Neck: Normal range of motion. Neck supple. No thyromegaly present.  Cardiovascular: Normal rate, regular rhythm, normal heart sounds and intact distal pulses.  No murmur heard. Pulses:      Radial pulses are 2+ on the right side, and 2+ on the left side.       Dorsalis pedis pulses are 2+ on the right side, and 2+ on the left side.  Pulmonary/Chest: Effort normal and breath sounds normal. No respiratory distress. He has no wheezes. He has no rhonchi. He has no rales.  Abdominal: Soft. Normal appearance and bowel sounds are normal. He exhibits no distension. There is no hepatosplenomegaly. There is tenderness in the right lower quadrant and periumbilical area. There is tenderness at McBurney's point. There is no rigidity and no guarding.  Musculoskeletal: Normal range of motion. He exhibits no edema, tenderness or deformity.  Lymphadenopathy:    He has no cervical adenopathy.  Neurological: He is alert and oriented to person, place, and time.  Skin: Skin is warm and dry. No rash noted. He is not diaphoretic.  Psychiatric: He has a normal mood and affect.  Nursing note and vitals reviewed.   Results for orders placed or performed during the hospital encounter of 11/24/17 (from the past 48 hour(s))  Lipase, blood     Status: None   Collection Time: 11/24/17 10:15 AM  Result Value Ref Range   Lipase 24 11 - 51 U/L    Comment: Performed at [facility], [place] [address] [place], [place] [address]  Comprehensive metabolic panel     Status: Abnormal   Collection Time: 11/24/17 10:15 AM  Result Value Ref Range   Sodium 137 135 - 145 mmol/L   Potassium 3.4 (L) 3.5 - 5.1 mmol/L   Chloride 102 98 - 111 mmol/L    Comment: Please note change in reference range.   CO2 25 22 - 32 mmol/L    Glucose, Bld 246 (H) 70 - 99 mg/dL    Comment: Please note change in reference range.   BUN 48 (H) 8 - 23 mg/dL    Comment: Please note change in reference range.   Creatinine, Ser 2.53 (H) 0.61 - 1.24 mg/dL   Calcium 8.5 (L) 8.9 - 10.3 mg/dL   Total Protein 6.9 6.5 - 8.1 g/dL   Albumin 2.7 (L) 3.5 - 5.0 g/dL   AST 39 15 - 41 U/L   ALT 33 0 - 44 U/L    Comment: Please note change in reference range.   Alkaline Phosphatase 94 38 - 126 U/L   Total Bilirubin 0.7 0.3 - 1.2 mg/dL   GFR calc non Af Amer 24 (L) >60 mL/min   GFR calc Af Amer 28 (L) >60 mL/min    Comment: (NOTE) The eGFR has been calculated  using the CKD EPI equation. This calculation has not been validated in all clinical situations. eGFR's persistently <60 mL/min signify possible Chronic Kidney Disease.    Anion gap 10 5 - 15    Comment: Performed at [facility] [address], [place], [place] [address]  CBC     Status: Abnormal   Collection Time: 11/24/17 10:15 AM  Result Value Ref Range   WBC 16.9 (H) 4.0 - 10.5 K/uL   RBC 2.90 (L) 4.22 - 5.81 MIL/uL   Hemoglobin 9.6 (L) 13.0 - 17.0 g/dL   HCT 30.3 (L) 39.0 - 52.0 %   MCV 104.5 (H) 78.0 - 100.0 fL   MCH 33.1 26.0 - 34.0 pg   MCHC 31.7 30.0 - 36.0 g/dL   RDW 11.5 11.5 - 15.5 %   Platelets 278 150 - 400 K/uL    Comment: Performed at [facility], [place] [address], [place], [place] [address]   Ct Abdomen Pelvis Wo Contrast  Result Date: 11/24/2017 CLINICAL DATA:  Abdominal and periumbilical pain. No bowel movement for 1 week. EXAM: CT ABDOMEN AND PELVIS WITHOUT CONTRAST TECHNIQUE: Multidetector CT imaging of the abdomen and pelvis was performed following the standard protocol without IV contrast. COMPARISON:  February 13, 2015 FINDINGS: Lower chest: No acute abnormality. Hepatobiliary: No focal liver abnormality is seen. No gallstones, gallbladder wall thickening, or biliary dilatation. Pancreas: Unremarkable. No pancreatic ductal dilatation or surrounding  inflammatory changes. Spleen: Normal in size without focal abnormality. Adrenals/Urinary Tract: Adrenal glands are unremarkable. Kidneys are normal, without renal calculi, focal lesion, or hydronephrosis. Bladder is unremarkable. Stomach/Bowel: The stomach and small bowel are normal. The appendix is markedly abnormal with a distal appendicolith and distal dilatation measuring up to 2 cm. No extraluminal gas is noted. It would be difficult to confidently exclude a small extraluminal fluid collection but none is definitively seen. There is wall thickening and periappendiceal stranding. There is wall thickening in the cecum near the takeoff of the appendix which may be secondary to the markedly inflamed appendix. The remainder of the colon is unremarkable. Vascular/Lymphatic: Minimal atherosclerosis is seen in the nonaneurysmal aorta. No adenopathy. Reproductive: Prostate is unremarkable. Other: No abdominal wall hernia or abnormality. No abdominopelvic ascites. Musculoskeletal: Status post right hip replacement. Degenerative changes in the visualized spine. IMPRESSION: 1. Appendicitis with marked dilatation, wall thickening, and periappendiceal stranding. There is an appendicolith. Evaluation for a small extraluminal fluid collection would be limited without contrast but none are seen. Appendix: Location: Extends from the right lower quadrant of the abdomen into the right side of the pelvis. Abuts the sigmoid colon. Diameter: 2 cm Appendicolith: There is a single appendicoliths. Mucosal hyper-enhancement: Not assessed without contrast. Extraluminal gas: None identified. Periappendiceal collection: Difficult to evaluate without contrast but none seen. 2. Minimal atherosclerosis in the nonaneurysmal aorta. Electronically Signed   By: Dorise Bullion III M.D   On: 11/24/2017 13:18      Assessment/Plan  Appendicitis - recommend cards eval to determine if plavix can be held  - okay for diet per medicine. We will  place NPO orders when date of surgery is determined - continue IV antibiotics   Thank you for the consult and we appreciate medicine admission and assistance with pt's co morbidities.   Kalman Drape, [facility] Surgery 11/24/2017, 3:42 PM Pager: [phone] Consults: [phone]  Agree with above.  Will need to be off Plavix for 48 to 72 hours.  Ideally, it would be good to be  off the Plavix for 5 days, but that will probably not be practical.  Alphonsa Overall, MD, [facility] Surgery Pager: [phone] Office phone:  [phone]

## 2017-11-24 NOTE — Progress Notes (Signed)
Pharmacy Antibiotic Note  David [name] is a 72 y.o. male admitted on 11/24/2017 with appendicitis.  Pharmacy has been consulted for zosyn dosing. Pt is afebrile and WBC is elevated at 16.9. SCr is elevated at 2.53.   Plan: Zosyn 3.375gm IV Q8H (4 hr inf) F/u renal fxn, C&S, clinical status and surgery plans  Height: 6\' 2"  (188 cm) Weight: 220 lb (99.8 kg) IBW/kg (Calculated) : 82.2  Temp (24hrs), Avg:98.7 F (37.1 C), Min:98.4 F (36.9 C), Max:99 F (37.2 C)  Recent Labs  Lab 11/24/17 1015  WBC 16.9*  CREATININE 2.53*    Estimated Creatinine Clearance: 34.3 mL/min (A) (by C-G formula based on SCr of 2.53 mg/dL (H)).    No Known Allergies  Antimicrobials this admission: Zosyn 7/9>>  Dose adjustments this admission: N/A  Microbiology results: Pending  Thank you for allowing pharmacy to be a part of this patient's care.  Heddy Vidana, Rande Lawman 11/24/2017 7:52 PM

## 2017-11-24 NOTE — ED Triage Notes (Signed)
Arrived via EMS patient reported abdominal pain periumbilical and not having a BM for one week.

## 2017-11-25 ENCOUNTER — Encounter (HOSPITAL_COMMUNITY): Payer: Self-pay | Admitting: *Deleted

## 2017-11-25 ENCOUNTER — Encounter (HOSPITAL_COMMUNITY): Payer: Medicare Other

## 2017-11-25 ENCOUNTER — Telehealth (HOSPITAL_COMMUNITY): Payer: Self-pay | Admitting: *Deleted

## 2017-11-25 ENCOUNTER — Inpatient Hospital Stay (HOSPITAL_COMMUNITY): Payer: Medicare Other

## 2017-11-25 DIAGNOSIS — Z794 Long term (current) use of insulin: Secondary | ICD-10-CM

## 2017-11-25 DIAGNOSIS — Z955 Presence of coronary angioplasty implant and graft: Secondary | ICD-10-CM

## 2017-11-25 DIAGNOSIS — N183 Chronic kidney disease, stage 3 (moderate): Secondary | ICD-10-CM

## 2017-11-25 DIAGNOSIS — N179 Acute kidney failure, unspecified: Secondary | ICD-10-CM

## 2017-11-25 DIAGNOSIS — E1122 Type 2 diabetes mellitus with diabetic chronic kidney disease: Secondary | ICD-10-CM

## 2017-11-25 DIAGNOSIS — I10 Essential (primary) hypertension: Secondary | ICD-10-CM

## 2017-11-25 LAB — CBC
HEMATOCRIT: 30.4 % — AB (ref 39.0–52.0)
Hemoglobin: 9.6 g/dL — ABNORMAL LOW (ref 13.0–17.0)
MCH: 33 pg (ref 26.0–34.0)
MCHC: 31.6 g/dL (ref 30.0–36.0)
MCV: 104.5 fL — ABNORMAL HIGH (ref 78.0–100.0)
Platelets: 300 10*3/uL (ref 150–400)
RBC: 2.91 MIL/uL — ABNORMAL LOW (ref 4.22–5.81)
RDW: 11.6 % (ref 11.5–15.5)
WBC: 18 10*3/uL — ABNORMAL HIGH (ref 4.0–10.5)

## 2017-11-25 LAB — BASIC METABOLIC PANEL
Anion gap: 13 (ref 5–15)
BUN: 42 mg/dL — AB (ref 8–23)
CALCIUM: 8.4 mg/dL — AB (ref 8.9–10.3)
CO2: 26 mmol/L (ref 22–32)
Chloride: 101 mmol/L (ref 98–111)
Creatinine, Ser: 2.15 mg/dL — ABNORMAL HIGH (ref 0.61–1.24)
GFR calc Af Amer: 34 mL/min — ABNORMAL LOW (ref >60)
GFR, EST NON AFRICAN AMERICAN: 29 mL/min — AB (ref >60)
GLUCOSE: 115 mg/dL — AB (ref 70–99)
POTASSIUM: 3.3 mmol/L — AB (ref 3.5–5.1)
Sodium: 140 mmol/L (ref 135–145)

## 2017-11-25 LAB — GLUCOSE, CAPILLARY
Glucose-Capillary: 122 mg/dL — ABNORMAL HIGH (ref 70–99)
Glucose-Capillary: 183 mg/dL — ABNORMAL HIGH (ref 70–99)
Glucose-Capillary: 125 mg/dL — ABNORMAL HIGH (ref 70–99)
Glucose-Capillary: 93 mg/dL (ref 70–99)

## 2017-11-25 LAB — HEMOGLOBIN A1C
Hgb A1c MFr Bld: 7.5 % — ABNORMAL HIGH (ref 4.8–5.6)
Mean Plasma Glucose: 169 mg/dL

## 2017-11-25 LAB — HIV ANTIBODY (ROUTINE TESTING W REFLEX): HIV SCREEN 4TH GENERATION: NONREACTIVE

## 2017-11-25 MED ORDER — ASPIRIN 300 MG RE SUPP
150.0000 mg | Freq: Every day | RECTAL | Status: DC
  Filled 2017-11-25: qty 1

## 2017-11-25 MED ORDER — INSULIN ASPART 100 UNIT/ML ~~LOC~~ SOLN
0.0000 [IU] | Freq: Three times a day (TID) | SUBCUTANEOUS | Status: DC
  Administered 2017-11-26: 2 [IU] via SUBCUTANEOUS
  Administered 2017-11-27 – 2017-12-01 (×5): 1 [IU] via SUBCUTANEOUS
  Administered 2017-12-02: 2 [IU] via SUBCUTANEOUS
  Administered 2017-12-02: 1 [IU] via SUBCUTANEOUS
  Administered 2017-12-02: 5 [IU] via SUBCUTANEOUS
  Administered 2017-12-05 (×2): 2 [IU] via SUBCUTANEOUS
  Administered 2017-12-05: 1 [IU] via SUBCUTANEOUS
  Administered 2017-12-06: 2 [IU] via SUBCUTANEOUS
  Administered 2017-12-07 (×2): 1 [IU] via SUBCUTANEOUS
  Administered 2017-12-07: 2 [IU] via SUBCUTANEOUS

## 2017-11-25 MED ORDER — ASPIRIN 81 MG PO CHEW: 81.0000 mg | CHEWABLE_TABLET | Freq: Every day | ORAL | Status: DC

## 2017-11-25 MED ORDER — POTASSIUM CHLORIDE CRYS ER 20 MEQ PO TBCR
20.0000 meq | EXTENDED_RELEASE_TABLET | Freq: Once | ORAL | Status: AC
  Administered 2017-11-25: 20 meq via ORAL
  Filled 2017-11-25: qty 1

## 2017-11-25 MED ORDER — ASPIRIN 81 MG PO CHEW
81.0000 mg | CHEWABLE_TABLET | Freq: Every day | ORAL | Status: DC
  Administered 2017-11-25 – 2017-12-09 (×14): 81 mg via ORAL
  Filled 2017-11-25 (×15): qty 1

## 2017-11-25 MED ORDER — SODIUM CHLORIDE 0.9 % IV SOLN
INTRAVENOUS | Status: DC
  Administered 2017-11-25: 19:00:00 via INTRAVENOUS

## 2017-11-25 MED ORDER — SODIUM CHLORIDE 0.9 % IV SOLN
INTRAVENOUS | Status: DC
  Administered 2017-11-25: 12:00:00 via INTRAVENOUS

## 2017-11-25 NOTE — Progress Notes (Signed)
[facility]:  [phone] General Surgery Progress Note   LOS: 1 day  POD -     Chief Complaint: Abdominal pian  Assessment and Plan: 1.  Appendicitis - inflammatory mass on CT scan  WBC - 18,000 (increased) - 11/25/2017  Zosyn - 7/9 >>>  It would be ideal to be off Plavix > 5 days, but I will probably proceed with surgery tomorrow or Friday  He may have sips of clear liquids from the floor today.  Repeat CBC tomorrow.  2.  Anticoagulated - on Plavix - last dose 7/9 3.  CAD  Stent placed 04/07/2017 4.  History of CHF 5.  DM 6.  HTN 7.  CKD   Creatinine - 2.15 - 11/25/2017 (better) 8.  Anemia  Hgb - 9.6 - - 11/25/2017 9.  DVT prophylaxis - on hold   Principal Problem:   Appendicitis Active Problems:   Hypertension   Diabetes mellitus ([place])   Status post coronary artery stent placement   Acute renal failure superimposed on stage 3 chronic kidney disease (HCC)  Subjective:  Sore lower abdomen.  Has urgency for BM, but no stool  Objective:   Vitals:   11/24/17 2007 11/25/17 0426  BP: (!) 138/118 (!) 149/74  Pulse: 80 72  Resp: 18 18  Temp: 99.6 F (37.6 C) (!) 100.9 F (38.3 C)  SpO2: 97% 100%     Intake/Output from previous day:  07/09 0701 - 07/10 0700 In: 1821.2 [I.V.:1571.2; IV Piggyback:250] Out: 800 [Urine:800]  Intake/Output this shift:  No intake/output data recorded.   Physical Exam:   General: Older bearded AA M who is alert and oriented.    HEENT: Normal. Pupils equal. .   Lungs: Clear   Abdomen: Tender lower abdomen.  Rare BS.   Lab Results:    Recent Labs    11/24/17 1015 11/25/17 0419  WBC 16.9* 18.0*  HGB 9.6* 9.6*  HCT 30.3* 30.4*  PLT 278 300    BMET   Recent Labs    11/24/17 1015 11/25/17 0419  NA 137 140  K 3.4* 3.3*  CL 102 101  CO2 25 26  GLUCOSE 246* 115*  BUN 48* 42*  CREATININE 2.53* 2.15*  CALCIUM 8.5* 8.4*    PT/INR  No results for input(s): LABPROT, INR in the last 72  hours.  ABG  No results for input(s): PHART, HCO3 in the last 72 hours.  Invalid input(s): PCO2, PO2   Studies/Results:  Ct Abdomen Pelvis Wo Contrast  Result Date: 11/24/2017 CLINICAL DATA:  Abdominal and periumbilical pain. No bowel movement for 1 week. EXAM: CT ABDOMEN AND PELVIS WITHOUT CONTRAST TECHNIQUE: Multidetector CT imaging of the abdomen and pelvis was performed following the standard protocol without IV contrast. COMPARISON:  February 13, 2015 FINDINGS: Lower chest: No acute abnormality. Hepatobiliary: No focal liver abnormality is seen. No gallstones, gallbladder wall thickening, or biliary dilatation. Pancreas: Unremarkable. No pancreatic ductal dilatation or surrounding inflammatory changes. Spleen: Normal in size without focal abnormality. Adrenals/Urinary Tract: Adrenal glands are unremarkable. Kidneys are normal, without renal calculi, focal lesion, or hydronephrosis. Bladder is unremarkable. Stomach/Bowel: The stomach and small bowel are normal. The appendix is markedly abnormal with a distal appendicolith and distal dilatation measuring up to 2 cm. No extraluminal gas is noted. It would be difficult to confidently exclude a small extraluminal fluid collection but none is definitively seen. There is wall thickening and periappendiceal stranding. There is wall thickening in the cecum near the takeoff of the  appendix which may be secondary to the markedly inflamed appendix. The remainder of the colon is unremarkable. Vascular/Lymphatic: Minimal atherosclerosis is seen in the nonaneurysmal aorta. No adenopathy. Reproductive: Prostate is unremarkable. Other: No abdominal wall hernia or abnormality. No abdominopelvic ascites. Musculoskeletal: Status post right hip replacement. Degenerative changes in the visualized spine. IMPRESSION: 1. Appendicitis with marked dilatation, wall thickening, and periappendiceal stranding. There is an appendicolith. Evaluation for a small extraluminal fluid  collection would be limited without contrast but none are seen. Appendix: Location: Extends from the right lower quadrant of the abdomen into the right side of the pelvis. Abuts the sigmoid colon. Diameter: 2 cm Appendicolith: There is a single appendicoliths. Mucosal hyper-enhancement: Not assessed without contrast. Extraluminal gas: None identified. Periappendiceal collection: Difficult to evaluate without contrast but none seen. 2. Minimal atherosclerosis in the nonaneurysmal aorta. Electronically Signed   By: Dorise Bullion III M.D   On: 11/24/2017 13:18   Dg Chest Port 1 View  Result Date: 11/24/2017 CLINICAL DATA:  Preop cardiovascular exam. EXAM: PORTABLE CHEST 1 VIEW COMPARISON:  03/30/2017 FINDINGS: Cardiomegaly with aortic atherosclerosis. Coronary arterial stent projects over the left heart. Lungs are clear without pulmonary consolidation, overt pulmonary edema, effusion or pneumothorax. Lower cervical degenerative disc and facet arthropathy. No acute osseous abnormality. IMPRESSION: Cardiomegaly with aortic atherosclerosis. No active pulmonary disease. Electronically Signed   By: Ashley Royalty M.D.   On: 11/24/2017 18:20     Anti-infectives:   Anti-infectives (From admission, onward)   Start     Dose/Rate Route Frequency Ordered Stop   11/25/17 0300  piperacillin-tazobactam (ZOSYN) IVPB 3.375 g     3.375 g 12.5 mL/hr over 240 Minutes Intravenous Every 8 hours 11/24/17 1951     11/24/17 1930  piperacillin-tazobactam (ZOSYN) IVPB 3.375 g     3.375 g 100 mL/hr over 30 Minutes Intravenous  Once 11/24/17 1924 11/24/17 2229   11/24/17 1330  cefTRIAXone (ROCEPHIN) 2 g in sodium chloride 0.9 % 100 mL IVPB     2 g 200 mL/hr over 30 Minutes Intravenous  Once 11/24/17 1327 11/24/17 1456   11/24/17 1330  metroNIDAZOLE (FLAGYL) IVPB 500 mg     500 mg 100 mL/hr over 60 Minutes Intravenous  Once 11/24/17 1327 11/24/17 1658      Alphonsa Overall, MD, FACS Pager: [place]  [facility] Office: [phone] 11/25/2017

## 2017-11-25 NOTE — Progress Notes (Signed)
Discharge Progress Report  Patient Details  Name: David [name] MRN: [id number] Date of Birth: Jul 03, 1945 Referring Provider:     CARDIAC REHAB PHASE II ORIENTATION from 08/20/2017 in [place]  Referring Provider  David Prows MD        Number of Visits: 21  Reason for Discharge:  Patient has met program and personal goals.  Smoking History:  Social History   Tobacco Use  Smoking Status Never Smoker  Smokeless Tobacco Never Used    Diagnosis:  Status post coronary artery stent placement 04/06/17 S/P Stent Distal LAD, 04/07/17 S/P Stent Mid LAD  ADL UCSD:   Initial Exercise Prescription:   Discharge Exercise Prescription (Final Exercise Prescription Changes): Exercise Prescription Changes - 11/16/17 0949      Response to Exercise   Blood Pressure (Admit)  130/60    Blood Pressure (Exercise)  140/60    Blood Pressure (Exit)  118/52    Heart Rate (Admit)  70 bpm    Heart Rate (Exercise)  91 bpm    Heart Rate (Exit)  64 bpm    Rating of Perceived Exertion (Exercise)  13    Symptoms  none    Duration  Progress to 30 minutes of  aerobic without signs/symptoms of physical distress    Intensity  THRR unchanged      Progression   Progression  Continue to progress workloads to maintain intensity without signs/symptoms of physical distress.    Average METs  2.2      Resistance Training   Training Prescription  No No waits due to balance issues.      Interval Training   Interval Training  No      NuStep   Level  4    SPM  80    Minutes  20    METs  2.2      Track   Laps  3.5 6MWT 770 feet    Minutes  6    METs  2.12      Home Exercise Plan   Plans to continue exercise at  [facility] (comment)    Frequency  Add 2 additional days to program exercise sessions.    Initial Home Exercises Provided  08/31/17       Functional Capacity: 6 Minute Walk    Row Name 11/16/17 1000         6 Minute Walk   Phase   Discharge     Distance  770 feet     Distance % Change  14.44 %     Distance Feet Change  130 ft     Walk Time  6 minutes     # of Rest Breaks  0     MPH  1.46     METS  1.86     RPE  13     VO2 Peak  6.52     Symptoms  Yes (comment)     Comments  Patient states his walking was limited due to balance issues. Pt pushed rollator to assist with balance.     Resting HR  70 bpm     Resting BP  130/60     Max Ex. HR  91 bpm     Max Ex. BP  140/50     2 Minute Post BP  118/52        Psychological, QOL, Others - Outcomes: PHQ 2/9: Depression screen [facility] 2/9 08/24/2017 07/03/2014  Decreased Interest 0 1  Down, Depressed, Hopeless 0 1  PHQ - 2 Score 0 2  Change in appetite - 1  Feeling bad or failure about yourself  - 1  Moving slowly or fidgety/restless - 0    Quality of Life:   Personal Goals: Goals established at orientation with interventions provided to work toward goal.    Personal Goals Discharge: Goals and Risk Factor Review    Row Name 09/30/17 0851 10/29/17 1042           Core Components/Risk Factors/Patient Goals Review   Personal Goals Review  Weight Management/Obesity;Lipids;Hypertension;Diabetes  Weight Management/Obesity;Lipids;Hypertension;Diabetes      Review  David [name]'s vital signs and CBG's have been stable. Patient has made slow progress due to deconditioning  David [name]'s vital signs and CBG's have been stable. Patient has made slow progress due to deconditioning      Expected Outcomes  Patient will continue to take his medications as presribed and come to cardiac rehab for exercise  Patient will continue to take his medications as presribed and come to cardiac rehab for exercise         Exercise Goals and Review:   Nutrition & Weight - Outcomes:  Post Biometrics - 11/16/17 1008       Post  Biometrics   Waist Circumference  44 inches    Hip Circumference  45 inches    Waist to Hip Ratio  0.98 %    Triceps Skinfold  15 mm    Grip Strength  31 kg     Flexibility  0 in    Single Leg Stand  0 seconds       Nutrition:   Nutrition Discharge: Nutrition Assessments - 12/11/17 1144      MEDFICTS Scores   Pre Score  27    Post Score  -- did not return survey       Education Questionnaire Score:   Goals reviewed with patient; copy given to patient. David [name] attended cardiac rehab from 08/20/17 to 701/19 David [name]'s attendance was fair. David [name] had some orthopedic issues and used his cane. David [name] was out between 09/15/17-11/15/17 due to CHF. David [name] decreased his distance on his post exercise walk test by 200 feet. David [name] did fair with exercise and enjoyed participating in the program. David [name] is currently hospitalized and will be discharged from the program. David [name] lost 5.1 kg during his participation in the program.David [name] [place], RN,BSN 12/17/2017 10:57 AM

## 2017-11-25 NOTE — Consult Note (Signed)
CARDIOLOGY CONSULT NOTE  Patient ID: David [name] MRN: [id number] DOB/AGE: May 07, 1946 72 y.o.  Admit date: 11/24/2017 Referring Physician  Vance Gather, MD Primary Physician:  [name], MD Reason for Consultation  Pre-Op Evaluation  HPI: David [name] is an African-American male with uncontrolled hypertension, uncontrolled diabetes mellitus, chronic diastolic heart failure him a very artery disease with complex angioplasty to LAD in November 2018, stage III to 4 chronic kidney disease, tonic anemia, admitted to the hospital with acute abdominal discomfort with the diagnosis of acute appendicitis.  It is planned to proceed with surgery and appendectomy, I was asked to see the patient for preoperative cardiac risk stratification.  Patient has been doing well, his last episode of heart failure was in beginning of May 2019 and since making lifestyle changes, having enrolled in cardiac rehabilitation, he has not had any further CHF.  No angina pectoris, no leg edema, no PND or orthopnea.  Abdominal discomfort started 5 days ago, got incessantly worse and eventually presented to the emergency room yesterday morning.  Past Medical History:  Diagnosis Date  . Arthritis   . CAD in native artery    s/p stent in 11/18  . CHF (congestive heart failure) ([place])    normal echo in 11/18  . CKD (chronic kidney disease)   . DDD (degenerative disc disease), cervical   . Diabetes mellitus without complication ([place])    type II  . Diabetic neuropathy ([place])   . Diabetic retinopathy ([place])   . GERD (gastroesophageal reflux disease)   . Glaucoma   . Hypertension   . Renal disorder    Stage II     Past Surgical History:  Procedure Laterality Date  . AMPUTATION Right 07/09/2016   Procedure: Right Great Toe Amputation at Metatarsophalangeal Joint;  Surgeon: [name], MD;  Location: [facility];  Service: Orthopedics;  Laterality: Right;  . BACK SURGERY     4  . CARDIAC CATHETERIZATION    .  CORONARY STENT INTERVENTION N/A 04/06/2017   Procedure: CORONARY STENT INTERVENTION;  Surgeon: Nigel Mormon, MD;  Location: [facility];  Service: Cardiovascular;  Laterality: N/A;  . CORONARY STENT INTERVENTION N/A 04/07/2017   Procedure: CORONARY STENT INTERVENTION;  Surgeon: Nigel Mormon, MD;  Location: [facility];  Service: Cardiovascular;  Laterality: N/A;  . CYST EXCISION     on Back  . JOINT REPLACEMENT    . RIGHT/LEFT HEART CATH AND CORONARY ANGIOGRAPHY N/A 04/06/2017   Procedure: RIGHT/LEFT HEART CATH AND CORONARY ANGIOGRAPHY;  Surgeon: Nigel Mormon, MD;  Location: [facility];  Service: Cardiovascular;  Laterality: N/A;  . TOTAL HIP ARTHROPLASTY     Right      Family History  Problem Relation Age of Onset  . Diabetes Other   . Hyperlipidemia Other   . Hypertension Other   . Stroke Other   . Alzheimer's disease Other   . Thyroid disease Mother   . Diabetes Mellitus II Father      Social History: Social History   Socioeconomic History  . Marital status: Married    Spouse name: Not on file  . Number of children: 2  . Years of education: 56  . Highest education level: Master's degree (e.g., MA, MS, MEng, MEd, MSW, MBA)  Occupational History  . Occupation: Retired  Scientific laboratory technician  . Financial resource strain: Not hard at all  . Food insecurity:    Worry: Not on file    Inability:  Not on file  . Transportation needs:    Medical: Not on file    Non-medical: Not on file  Tobacco Use  . Smoking status: Never Smoker  . Smokeless tobacco: Never Used  Substance and Sexual Activity  . Alcohol use: Not Currently  . Drug use: No  . Sexual activity: Yes  Lifestyle  . Physical activity:    Days per week: 0 days    Minutes per session: 0 min  . Stress: Not at all  Relationships  . Social connections:    Talks on phone: Not on file    Gets together: Not on file    Attends religious service: Not on file    Active member of  club or organization: Not on file    Attends meetings of clubs or organizations: Not on file    Relationship status: Not on file  . Intimate partner violence:    Fear of current or ex partner: Not on file    Emotionally abused: Not on file    Physically abused: Not on file    Forced sexual activity: Not on file  Other Topics Concern  . Not on file  Social History Narrative  . Not on file     Medications Prior to Admission  Medication Sig Dispense Refill Last Dose  . albuterol (PROVENTIL HFA;VENTOLIN HFA) 108 (90 Base) MCG/ACT inhaler Inhale 2 puffs into the lungs every 6 (six) hours as needed for wheezing or shortness of breath. 1 Inhaler 2 prn at unk  . ALPRAZolam (XANAX) 1 MG tablet TAKE 1 TABLET BY MOUTH UP TO TWICE DAILY AS NEEDED FOR ANXEITY  2 prn at unk  . amLODipine (NORVASC) 5 MG tablet Take 1 tablet (5 mg total) by mouth daily. 30 tablet 1 11/24/2017 at 0700  . aspirin EC 81 MG tablet Take 81 mg by mouth daily.   11/24/2017 at 0700  . atorvastatin (LIPITOR) 40 MG tablet Take 40 mg by mouth at bedtime.  11 11/23/2017 at 1900  . clopidogrel (PLAVIX) 75 MG tablet Take 1 tablet (75 mg total) by mouth daily. 30 tablet 1 11/24/2017 at 0700  . dorzolamide-timolol (COSOPT) 22.3-6.8 MG/ML ophthalmic solution Place 1 drop into both eyes 2 (two) times daily.  98 11/24/2017 at Unknown time  . fluticasone (FLONASE) 50 MCG/ACT nasal spray Place 1 spray into both nostrils daily. 16 g 1 Past Month at Unknown time  . gabapentin (NEURONTIN) 600 MG tablet Take 600 mg by mouth 2 (two) times daily.    11/24/2017 at 0700  . HUMALOG KWIKPEN 100 UNIT/ML KiwkPen Inject 4 Units into the skin 3 (three) times daily.  4 11/23/2017 at Unknown time  . HYDROcodone-acetaminophen (NORCO) 10-325 MG tablet Take 1 tablet by mouth 2 (two) times daily as needed for moderate pain.   0 11/24/2017 at 0700  . Insulin Detemir (LEVEMIR FLEXTOUCH) 100 UNIT/ML Pen Inject 40 Units into the skin daily. (Patient taking differently: Inject 42  Units into the skin daily. ) 3 mL 1 11/24/2017 at 0700  . isosorbide-hydrALAZINE (BIDIL) 20-37.5 MG tablet Take 1 tablet by mouth 3 (three) times daily. 90 tablet 0 11/24/2017 at 0700  . methocarbamol (ROBAXIN) 750 MG tablet Take 1 tablet (750 mg total) by mouth every 6 (six) hours as needed for muscle spasms. 30 tablet 0 prn at unk  . metoprolol succinate (TOPROL-XL) 100 MG 24 hr tablet Take 1 tablet (100 mg total) by mouth daily. Take with or immediately following a meal. 30 tablet 1  11/24/2017 at 0700  . polyethylene glycol (MIRALAX / GLYCOLAX) packet Take 17 g by mouth daily as needed for mild constipation.   Past Week at Unknown time  . furosemide (LASIX) 20 MG tablet Take 1 tablet (20 mg total) by mouth daily. Take twice daily for 5 days, and then daily after that (Patient not taking: Reported on 11/24/2017) 30 tablet 1 Not Taking at Unknown time  . iron polysaccharides (NIFEREX) 150 MG capsule Take 1 capsule (150 mg total) by mouth daily. (Patient not taking: Reported on 11/24/2017) 30 capsule 1 Not Taking at Unknown time   Review of Systems  Constitutional: Positive for malaise/fatigue. Negative for chills, diaphoresis and fever.  HENT: Negative.   Eyes: Negative.   Respiratory: Negative.   Cardiovascular: Negative.   Gastrointestinal: Positive for abdominal pain and nausea. Negative for blood in stool, diarrhea and melena.  Genitourinary: Negative.   Musculoskeletal: Negative.   Neurological: Positive for tingling (numbness in hand and feet from neuropathy).  Endo/Heme/Allergies: Negative.   Psychiatric/Behavioral: Negative.     Physical Exam: Blood pressure (!) 149/74, pulse 72, temperature (!) 100.9 F (38.3 C), temperature source Oral, resp. rate 18, height 6\' 2"  (1.88 m), weight 99.8 kg (220 lb), SpO2 100 %. Body mass index is 28.25 kg/m.  Physical Exam  Constitutional: He is oriented to person, place, and time. He appears well-developed and well-nourished. He appears distressed (due  to abdominal pain).  HENT:  Head: Atraumatic.  Eyes: Conjunctivae are normal.  Neck: Normal range of motion. Neck supple. No JVD present. No thyromegaly present.  Cardiovascular: Normal rate, regular rhythm, normal heart sounds and intact distal pulses. Exam reveals no friction rub.  No murmur heard. Pulmonary/Chest: Effort normal and breath sounds normal.  Abdominal: Bowel sounds are normal. He exhibits distension. There is tenderness. There is guarding.  Musculoskeletal: Normal range of motion. He exhibits edema (trace).  Neurological: He is alert and oriented to person, place, and time.  Skin: Skin is warm and dry.  Psychiatric: He has a normal mood and affect.    Labs:   Lab Results  Component Value Date   WBC 18.0 (H) 11/25/2017   HGB 9.6 (L) 11/25/2017   HCT 30.4 (L) 11/25/2017   MCV 104.5 (H) 11/25/2017   PLT 300 11/25/2017    Recent Labs  Lab 11/24/17 1015 11/25/17 0419  NA 137 140  K 3.4* 3.3*  CL 102 101  CO2 25 26  BUN 48* 42*  CREATININE 2.53* 2.15*  CALCIUM 8.5* 8.4*  PROT 6.9  --   BILITOT 0.7  --   ALKPHOS 94  --   ALT 33  --   AST 39  --   GLUCOSE 246* 115*    Lipid Panel  No results found for: CHOL, TRIG, HDL, CHOLHDL, VLDL, LDLCALC  BNP (last 3 results) Recent Labs    03/29/17 0102  BNP 119.9*    HEMOGLOBIN A1C Lab Results  Component Value Date   HGBA1C 7.2 (H) 04/01/2017   MPG 159.94 04/01/2017    Cardiac Panel (last 3 results) Recent Labs    04/01/17 1521  TROPONINI <0.03    Lab Results  Component Value Date   CKTOTAL 163 02/07/2015   TROPONINI <0.03 04/01/2017      Radiology: Ct Abdomen Pelvis Wo Contrast  Result Date: 11/24/2017 CLINICAL DATA:  Abdominal and periumbilical pain. No bowel movement for 1 week. EXAM: CT ABDOMEN AND PELVIS WITHOUT CONTRAST TECHNIQUE: Multidetector CT imaging of the abdomen and pelvis was  performed following the standard protocol without IV contrast. COMPARISON:  February 13, 2015 FINDINGS:  Lower chest: No acute abnormality. Hepatobiliary: No focal liver abnormality is seen. No gallstones, gallbladder wall thickening, or biliary dilatation. Pancreas: Unremarkable. No pancreatic ductal dilatation or surrounding inflammatory changes. Spleen: Normal in size without focal abnormality. Adrenals/Urinary Tract: Adrenal glands are unremarkable. Kidneys are normal, without renal calculi, focal lesion, or hydronephrosis. Bladder is unremarkable. Stomach/Bowel: The stomach and small bowel are normal. The appendix is markedly abnormal with a distal appendicolith and distal dilatation measuring up to 2 cm. No extraluminal gas is noted. It would be difficult to confidently exclude a small extraluminal fluid collection but none is definitively seen. There is wall thickening and periappendiceal stranding. There is wall thickening in the cecum near the takeoff of the appendix which may be secondary to the markedly inflamed appendix. The remainder of the colon is unremarkable. Vascular/Lymphatic: Minimal atherosclerosis is seen in the nonaneurysmal aorta. No adenopathy. Reproductive: Prostate is unremarkable. Other: No abdominal wall hernia or abnormality. No abdominopelvic ascites. Musculoskeletal: Status post right hip replacement. Degenerative changes in the visualized spine. IMPRESSION: 1. Appendicitis with marked dilatation, wall thickening, and periappendiceal stranding. There is an appendicolith. Evaluation for a small extraluminal fluid collection would be limited without contrast but none are seen. Appendix: Location: Extends from the right lower quadrant of the abdomen into the right side of the pelvis. Abuts the sigmoid colon. Diameter: 2 cm Appendicolith: There is a single appendicoliths. Mucosal hyper-enhancement: Not assessed without contrast. Extraluminal gas: None identified. Periappendiceal collection: Difficult to evaluate without contrast but none seen. 2. Minimal atherosclerosis in the nonaneurysmal  aorta. Electronically Signed   By: Dorise Bullion III M.D   On: 11/24/2017 13:18   Dg Chest Port 1 View  Result Date: 11/24/2017 CLINICAL DATA:  Preop cardiovascular exam. EXAM: PORTABLE CHEST 1 VIEW COMPARISON:  03/30/2017 FINDINGS: Cardiomegaly with aortic atherosclerosis. Coronary arterial stent projects over the left heart. Lungs are clear without pulmonary consolidation, overt pulmonary edema, effusion or pneumothorax. Lower cervical degenerative disc and facet arthropathy. No acute osseous abnormality. IMPRESSION: Cardiomegaly with aortic atherosclerosis. No active pulmonary disease. Electronically Signed   By: Ashley Royalty M.D.   On: 11/24/2017 18:20    Scheduled Meds: . amLODipine  5 mg Oral Daily  . atorvastatin  40 mg Oral QHS  . dorzolamide-timolol  1 drop Both Eyes BID  . fluticasone  1 spray Each Nare Daily  . gabapentin  600 mg Oral BID  . insulin aspart  0-15 Units Subcutaneous TID WC  . insulin aspart  0-5 Units Subcutaneous QHS  . insulin detemir  42 Units Subcutaneous Daily  . isosorbide-hydrALAZINE  1 tablet Oral TID  . metoprolol succinate  100 mg Oral Daily  . potassium chloride  20 mEq Oral Once   Continuous Infusions: . sodium chloride    . lactated ringers 125 mL/hr at 11/25/17 0243  . piperacillin-tazobactam (ZOSYN)  IV 3.375 g (11/25/17 0242)   PRN Meds:.acetaminophen **OR** acetaminophen, ALPRAZolam, hydrALAZINE, morphine injection, ondansetron **OR** ondansetron (ZOFRAN) IV  CARDIAC STUDIES:  EKG 11/23/2017: Sinus with 1st degree AV Block, LVH with repolarization abnormality and prolonged QT, unchanged from 04/08/2017  Echocardiogram 03/30/2017: - Left ventricle: The cavity size was mildly dilated. There wasmoderate concentric hypertrophy. Systolic function was normal.The estimated ejection fraction was in the range of 55% to 60%. Wall motion was normal; there were no regional wall motion abnormalities. Left ventricular diastolic function  parameterswere normal. - Aortic valve: Trileaflet; normal thickness,  mildly calcified leaflets. Valve area (VTI): 2.55 cm^2. Valve area (Vmax): 2.78 cm^2. Valve area (Vmean): 2.51 cm^2. - Mitral valve: Mildly calcified chordae tendinae. - Tricuspid valve: There was trivial regurgitation. - Pulmonic valve: There was trivial regurgitation. - Pulmonary arteries: Systolic pressure could not be accuratelyestimated.  Coronary angiogram 04/07/2018: Successful complex PTCA and overlapping stents mid LAD Complexity due to severe calcification and severe tortuosity Xience 3.0 X 18 mm Synergy 3.0 X 16 mm, Residual 10% unde expansion of proximal stent. 04/06/2018: Successful PTCA and stent placement-distal LAD stenosis with resolute 2.75 x 26 cm stent. PTCA 90% mid LAD stenosis, unsuccessful stent placement.   ASSESSMENT AND PLAN:  1. Pre operative CV risk stratification 2. CAD native vessel without angina or CHF 3. DM uncontrolled with hyperglycemia with stage 4 CKD 4. Hypertension 5. Hyperlipidemia  Rec: Patient with acute appendicitis and still having pain and abdominal guarding and tenderness. Agree on holding Plavix but would continue ASA 81 mg for now.  No CHF on exam and BP is controlled and DM is stable. Continue statins.  Proceed with surgery with acceptable/low risk for cardiac mortality/morbidity.  Adrian Prows, MD 11/25/2017, 9:42 AM [facility]. [place] Pager: [phone] Office: [phone] If no answer Cell [phone]

## 2017-11-25 NOTE — Progress Notes (Signed)
PROGRESS NOTE  David [name]  QMG:[id number] DOB: 1946/02/09 DOA: 11/24/2017 PCP: [name], MD  Outpatient Specialists: Cardiology, Dr. [name] Brief Narrative: David [name] is a 72 y.o. male with a history of CAD s/p stenting Nov 2018 on DAPT, previous CHF with most recent echo normal, T2DM, stage II CKD, HTN who presented with periumbilical abdominal pain found to have appendicitis related to appendicolith on imaging. Also with AKI and hyperglycemia. Surgery consulted and cardiology saw patient to inform decisions regarding antiplatelet therapy.   Assessment & Plan: Principal Problem:   Appendicitis Active Problems:   Hypertension   Diabetes mellitus ([place])   Status post coronary artery stent placement   Acute renal failure superimposed on stage 3 chronic kidney disease (HCC)  Acute appendicitis:  - Diet per surgery. Planning appendectomy in next 1-2 days.  - Holding plavix (discussed with Dr. [name] this afternoon)  CAD s/p PCI by Dr. [name] 04/06/2017: Maintained on DAPT. Appears euvolemic.  - Cardiology, Dr. [name], evaluated patient. No further work up needed prior to surgery. - Continue ASA, ok to hold plavix with significance of clinical risk imposed by appendicitis.  - Continue statin   Acute renal failure on stage II CKD: Improving with IVFs, suspect due to dehydration with poor po and ongoing metolazone use.  - Continue IVF's again today and hold diuretics.  - Monitor UOP, SCr  HTN:  - Continue metoprolol, bidil, norvasc  IDT2DM: Last HbA1c 7.2% in Nov.  - Update A1c - Continue levemir and give SSI decrease to sensitive  Left hip pain: Reported fall in bathroom a few days PTA with resultant left latera thigh pain.  - Will r/o osseous abnormality with XR. Exam consistent with contusion vs. trochanteric bursitis. Hold NSAIDs. Pt actually not that worried about the pain. - PT/OT post op  DVT prophylaxis: SCDs Code Status: Full Family Communication:  Wife Disposition Plan: Uncertain.   Consultants:   Surgery  Cardiology  Procedures:   None  Antimicrobials:  Cipro, flagyl   Subjective: Abdominal pain remains severe. States he is hungry but his wife states he isn't eating anything or drinking much. Left hip pain is mild now, but his wife states he complains about it all the time.   Objective: Vitals:   11/24/17 1900 11/24/17 2007 11/25/17 0426 11/25/17 1447  BP: (!) 169/80 (!) 138/118 (!) 149/74 (!) 142/69  Pulse: 69 80 72 67  Resp:  18 18 17   Temp:  99.6 F (37.6 C) (!) 100.9 F (38.3 C) 98.7 F (37.1 C)  TempSrc:  Oral Oral Oral  SpO2: 96% 97% 100% 99%  Weight:      Height:        Intake/Output Summary (Last 24 hours) at 11/25/2017 1633 Last data filed at 11/25/2017 1450 Gross per 24 hour  Intake 2081.16 ml  Output 1400 ml  Net 681.16 ml   Filed Weights   11/24/17 1004  Weight: 99.8 kg (220 lb)    Gen: 72 y.o. male in no distress Pulm: Non-labored breathing room air. Clear to auscultation bilaterally.  CV: Regular rate and rhythm. No murmur, rub, or gallop. No JVD, no pedal edema. GI: Abdomen distended, tender periumbilically, no rebound. Normoactive bowel sounds. No organomegaly or masses felt. Ext: Warm, no deformities. Left lateral thigh tenderness over greater trochanter, not severely tender. No ecchymosis.  Skin: No rashes, lesions or ulcers Neuro: Alert and oriented. No focal neurological deficits. Psych: Judgement and insight appear normal. Mood & affect appropriate.   Data Reviewed:  I have personally reviewed following labs and imaging studies  CBC: Recent Labs  Lab 11/24/17 1015 11/25/17 0419  WBC 16.9* 18.0*  HGB 9.6* 9.6*  HCT 30.3* 30.4*  MCV 104.5* 104.5*  PLT 278 417   Basic Metabolic Panel: Recent Labs  Lab 11/24/17 1015 11/25/17 0419  NA 137 140  K 3.4* 3.3*  CL 102 101  CO2 25 26  GLUCOSE 246* 115*  BUN 48* 42*  CREATININE 2.53* 2.15*  CALCIUM 8.5* 8.4*    GFR: Estimated Creatinine Clearance: 40.3 mL/min (A) (by C-G formula based on SCr of 2.15 mg/dL (H)). Liver Function Tests: Recent Labs  Lab 11/24/17 1015  AST 39  ALT 33  ALKPHOS 94  BILITOT 0.7  PROT 6.9  ALBUMIN 2.7*   Recent Labs  Lab 11/24/17 1015  LIPASE 24   No results for input(s): AMMONIA in the last 168 hours. Coagulation Profile: No results for input(s): INR, PROTIME in the last 168 hours. Cardiac Enzymes: No results for input(s): CKTOTAL, CKMB, CKMBINDEX, TROPONINI in the last 168 hours. BNP (last 3 results) No results for input(s): PROBNP in the last 8760 hours. HbA1C: No results for input(s): HGBA1C in the last 72 hours. CBG: Recent Labs  Lab 11/24/17 2056 11/24/17 2244 11/25/17 0658 11/25/17 1142 11/25/17 1621  GLUCAP 63* 85 122* 183* 125*   Lipid Profile: No results for input(s): CHOL, HDL, LDLCALC, TRIG, CHOLHDL, LDLDIRECT in the last 72 hours. Thyroid Function Tests: No results for input(s): TSH, T4TOTAL, FREET4, T3FREE, THYROIDAB in the last 72 hours. Anemia Panel: No results for input(s): VITAMINB12, FOLATE, FERRITIN, TIBC, IRON, RETICCTPCT in the last 72 hours. Urine analysis:    Component Value Date/Time   COLORURINE YELLOW 11/24/2017 1525   APPEARANCEUR HAZY (A) 11/24/2017 1525   LABSPEC 1.017 11/24/2017 1525   PHURINE 5.0 11/24/2017 1525   GLUCOSEU NEGATIVE 11/24/2017 1525   HGBUR MODERATE (A) 11/24/2017 1525   BILIRUBINUR NEGATIVE 11/24/2017 1525   KETONESUR NEGATIVE 11/24/2017 1525   PROTEINUR 100 (A) 11/24/2017 1525   UROBILINOGEN 1.0 02/13/2015 1934   NITRITE NEGATIVE 11/24/2017 1525   LEUKOCYTESUR NEGATIVE 11/24/2017 1525   No results found for this or any previous visit (from the past 240 hour(s)).    Radiology Studies: Ct Abdomen Pelvis Wo Contrast  Result Date: 11/24/2017 CLINICAL DATA:  Abdominal and periumbilical pain. No bowel movement for 1 week. EXAM: CT ABDOMEN AND PELVIS WITHOUT CONTRAST TECHNIQUE: Multidetector  CT imaging of the abdomen and pelvis was performed following the standard protocol without IV contrast. COMPARISON:  February 13, 2015 FINDINGS: Lower chest: No acute abnormality. Hepatobiliary: No focal liver abnormality is seen. No gallstones, gallbladder wall thickening, or biliary dilatation. Pancreas: Unremarkable. No pancreatic ductal dilatation or surrounding inflammatory changes. Spleen: Normal in size without focal abnormality. Adrenals/Urinary Tract: Adrenal glands are unremarkable. Kidneys are normal, without renal calculi, focal lesion, or hydronephrosis. Bladder is unremarkable. Stomach/Bowel: The stomach and small bowel are normal. The appendix is markedly abnormal with a distal appendicolith and distal dilatation measuring up to 2 cm. No extraluminal gas is noted. It would be difficult to confidently exclude a small extraluminal fluid collection but none is definitively seen. There is wall thickening and periappendiceal stranding. There is wall thickening in the cecum near the takeoff of the appendix which may be secondary to the markedly inflamed appendix. The remainder of the colon is unremarkable. Vascular/Lymphatic: Minimal atherosclerosis is seen in the nonaneurysmal aorta. No adenopathy. Reproductive: Prostate is unremarkable. Other: No abdominal wall hernia or  abnormality. No abdominopelvic ascites. Musculoskeletal: Status post right hip replacement. Degenerative changes in the visualized spine. IMPRESSION: 1. Appendicitis with marked dilatation, wall thickening, and periappendiceal stranding. There is an appendicolith. Evaluation for a small extraluminal fluid collection would be limited without contrast but none are seen. Appendix: Location: Extends from the right lower quadrant of the abdomen into the right side of the pelvis. Abuts the sigmoid colon. Diameter: 2 cm Appendicolith: There is a single appendicoliths. Mucosal hyper-enhancement: Not assessed without contrast. Extraluminal gas:  None identified. Periappendiceal collection: Difficult to evaluate without contrast but none seen. 2. Minimal atherosclerosis in the nonaneurysmal aorta. Electronically Signed   By: Dorise Bullion III M.D   On: 11/24/2017 13:18   Dg Chest Port 1 View  Result Date: 11/24/2017 CLINICAL DATA:  Preop cardiovascular exam. EXAM: PORTABLE CHEST 1 VIEW COMPARISON:  03/30/2017 FINDINGS: Cardiomegaly with aortic atherosclerosis. Coronary arterial stent projects over the left heart. Lungs are clear without pulmonary consolidation, overt pulmonary edema, effusion or pneumothorax. Lower cervical degenerative disc and facet arthropathy. No acute osseous abnormality. IMPRESSION: Cardiomegaly with aortic atherosclerosis. No active pulmonary disease. Electronically Signed   By: Ashley Royalty M.D.   On: 11/24/2017 18:20    Scheduled Meds: . amLODipine  5 mg Oral Daily  . aspirin  81 mg Oral Daily  . atorvastatin  40 mg Oral QHS  . dorzolamide-timolol  1 drop Both Eyes BID  . fluticasone  1 spray Each Nare Daily  . gabapentin  600 mg Oral BID  . insulin aspart  0-15 Units Subcutaneous TID WC  . insulin aspart  0-5 Units Subcutaneous QHS  . insulin detemir  42 Units Subcutaneous Daily  . isosorbide-hydrALAZINE  1 tablet Oral TID  . metoprolol succinate  100 mg Oral Daily   Continuous Infusions: . sodium chloride 125 mL/hr at 11/25/17 1209  . lactated ringers 125 mL/hr at 11/25/17 1002  . piperacillin-tazobactam (ZOSYN)  IV 3.375 g (11/25/17 1202)     LOS: 1 day   Time spent: 25 minutes.  Patrecia Pour, MD [facility] [email or web address] Password [facility] 11/25/2017, 4:33 PM

## 2017-11-26 ENCOUNTER — Inpatient Hospital Stay (HOSPITAL_COMMUNITY): Payer: Medicare Other | Admitting: Certified Registered"

## 2017-11-26 ENCOUNTER — Encounter (HOSPITAL_COMMUNITY): Payer: Self-pay | Admitting: Orthopedic Surgery

## 2017-11-26 ENCOUNTER — Encounter (HOSPITAL_COMMUNITY)
Admission: EM | Disposition: A | Discharge status: Skilled Nursing Facility | Payer: Self-pay | Source: Home / Self Care | Attending: Family Medicine

## 2017-11-26 HISTORY — PX: LAPAROSCOPIC ABDOMINAL EXPLORATION: SHX6249

## 2017-11-26 LAB — GLUCOSE, CAPILLARY
GLUCOSE-CAPILLARY: 82 mg/dL (ref 70–99)
GLUCOSE-CAPILLARY: 209 mg/dL — AB (ref 70–99)
Glucose-Capillary: 193 mg/dL — ABNORMAL HIGH (ref 70–99)
Glucose-Capillary: 162 mg/dL — ABNORMAL HIGH (ref 70–99)
Glucose-Capillary: 138 mg/dL — ABNORMAL HIGH (ref 70–99)
Glucose-Capillary: 106 mg/dL — ABNORMAL HIGH (ref 70–99)
Glucose-Capillary: 61 mg/dL — ABNORMAL LOW (ref 70–99)
Glucose-Capillary: 174 mg/dL — ABNORMAL HIGH (ref 70–99)
Glucose-Capillary: 55 mg/dL — ABNORMAL LOW (ref 70–99)

## 2017-11-26 LAB — BASIC METABOLIC PANEL
Anion gap: 13 (ref 5–15)
BUN: 39 mg/dL — ABNORMAL HIGH (ref 8–23)
CALCIUM: 8.2 mg/dL — AB (ref 8.9–10.3)
CO2: 24 mmol/L (ref 22–32)
CREATININE: 2.62 mg/dL — AB (ref 0.61–1.24)
Chloride: 102 mmol/L (ref 98–111)
GFR, EST AFRICAN AMERICAN: 27 mL/min — AB (ref >60)
GFR, EST NON AFRICAN AMERICAN: 23 mL/min — AB (ref >60)
Glucose, Bld: 99 mg/dL (ref 70–99)
Potassium: 3.2 mmol/L — ABNORMAL LOW (ref 3.5–5.1)
Sodium: 139 mmol/L (ref 135–145)

## 2017-11-26 LAB — CBC WITH DIFFERENTIAL/PLATELET
ABS IMMATURE GRANULOCYTES: 0.2 10*3/uL — AB (ref 0.0–0.1)
BASOS ABS: 0.1 10*3/uL (ref 0.0–0.1)
Basophils Relative: 0 %
EOS ABS: 0 10*3/uL (ref 0.0–0.7)
Eosinophils Relative: 0 %
HCT: 31.7 % — ABNORMAL LOW (ref 39.0–52.0)
Hemoglobin: 10.1 g/dL — ABNORMAL LOW (ref 13.0–17.0)
IMMATURE GRANULOCYTES: 1 %
Lymphocytes Relative: 3 %
Lymphs Abs: 0.8 10*3/uL (ref 0.7–4.0)
MCH: 33.3 pg (ref 26.0–34.0)
MCHC: 31.9 g/dL (ref 30.0–36.0)
MCV: 104.6 fL — AB (ref 78.0–100.0)
Monocytes Absolute: 2.2 10*3/uL — ABNORMAL HIGH (ref 0.1–1.0)
Monocytes Relative: 9 %
NEUTROS ABS: 21.2 10*3/uL — AB (ref 1.7–7.7)
NEUTROS PCT: 87 %
PLATELETS: 301 10*3/uL (ref 150–400)
RBC: 3.03 MIL/uL — ABNORMAL LOW (ref 4.22–5.81)
RDW: 11.7 % (ref 11.5–15.5)
WBC: 24.6 10*3/uL — AB (ref 4.0–10.5)

## 2017-11-26 LAB — SURGICAL PCR SCREEN
MRSA, PCR: NEGATIVE
Staphylococcus aureus: NEGATIVE

## 2017-11-26 SURGERY — EXPLORATION, ABDOMEN, LAPAROSCOPIC
Anesthesia: General | Site: Abdomen

## 2017-11-26 MED ORDER — ACETAMINOPHEN 10 MG/ML IV SOLN
INTRAVENOUS | Status: DC | PRN
  Administered 2017-11-26: 1000 mg via INTRAVENOUS

## 2017-11-26 MED ORDER — OXYCODONE HCL 5 MG PO TABS: 5.0000 mg | ORAL_TABLET | Freq: Once | ORAL | Status: DC | PRN

## 2017-11-26 MED ORDER — ESMOLOL HCL 100 MG/10ML IV SOLN
INTRAVENOUS | Status: AC
  Filled 2017-11-26: qty 10

## 2017-11-26 MED ORDER — CISATRACURIUM BESYLATE 20 MG/10ML IV SOLN
INTRAVENOUS | Status: AC
  Filled 2017-11-26: qty 20

## 2017-11-26 MED ORDER — SUCCINYLCHOLINE CHLORIDE 20 MG/ML IJ SOLN
INTRAMUSCULAR | Status: DC | PRN
  Administered 2017-11-26: 100 mg via INTRAVENOUS

## 2017-11-26 MED ORDER — SODIUM CHLORIDE 0.9 % IV SOLN
INTRAVENOUS | Status: DC
  Administered 2017-11-26: 15:00:00 via INTRAVENOUS

## 2017-11-26 MED ORDER — ONDANSETRON HCL 4 MG/2ML IJ SOLN: 4.0000 mg | Freq: Once | INTRAMUSCULAR | Status: DC | PRN

## 2017-11-26 MED ORDER — SUCCINYLCHOLINE CHLORIDE 200 MG/10ML IV SOSY
PREFILLED_SYRINGE | INTRAVENOUS | Status: AC
  Filled 2017-11-26: qty 20

## 2017-11-26 MED ORDER — GLYCOPYRROLATE 0.2 MG/ML IJ SOLN
INTRAMUSCULAR | Status: DC | PRN
  Administered 2017-11-26: .8 mg via INTRAVENOUS
  Administered 2017-11-26 (×2): 0.1 mg via INTRAVENOUS

## 2017-11-26 MED ORDER — PROPOFOL 10 MG/ML IV BOLUS
INTRAVENOUS | Status: DC | PRN
  Administered 2017-11-26: 50 mg via INTRAVENOUS
  Administered 2017-11-26: 150 mg via INTRAVENOUS

## 2017-11-26 MED ORDER — MIDAZOLAM HCL 5 MG/5ML IJ SOLN
INTRAMUSCULAR | Status: DC | PRN
  Administered 2017-11-26: 2 mg via INTRAVENOUS

## 2017-11-26 MED ORDER — KCL IN DEXTROSE-NACL 20-5-0.45 MEQ/L-%-% IV SOLN
INTRAVENOUS | Status: DC
  Administered 2017-11-26: 21:00:00 via INTRAVENOUS
  Filled 2017-11-26: qty 1000

## 2017-11-26 MED ORDER — 0.9 % SODIUM CHLORIDE (POUR BTL) OPTIME
TOPICAL | Status: DC | PRN
  Administered 2017-11-26: 1000 mL

## 2017-11-26 MED ORDER — ARTIFICIAL TEARS OPHTHALMIC OINT
TOPICAL_OINTMENT | OPHTHALMIC | Status: AC
  Filled 2017-11-26: qty 3.5

## 2017-11-26 MED ORDER — LACTATED RINGERS IV SOLN
INTRAVENOUS | Status: DC | PRN
  Administered 2017-11-26: 17:00:00 via INTRAVENOUS

## 2017-11-26 MED ORDER — PHENYLEPHRINE 40 MCG/ML (10ML) SYRINGE FOR IV PUSH (FOR BLOOD PRESSURE SUPPORT)
PREFILLED_SYRINGE | INTRAVENOUS | Status: AC
  Filled 2017-11-26: qty 30

## 2017-11-26 MED ORDER — ONDANSETRON HCL 4 MG/2ML IJ SOLN
INTRAMUSCULAR | Status: DC | PRN
  Administered 2017-11-26 (×2): 4 mg via INTRAVENOUS

## 2017-11-26 MED ORDER — BUPIVACAINE-EPINEPHRINE 0.25% -1:200000 IJ SOLN
INTRAMUSCULAR | Status: DC | PRN
  Administered 2017-11-26: 30 mL

## 2017-11-26 MED ORDER — ROCURONIUM BROMIDE 10 MG/ML (PF) SYRINGE
PREFILLED_SYRINGE | INTRAVENOUS | Status: AC
  Filled 2017-11-26: qty 10

## 2017-11-26 MED ORDER — MIDAZOLAM HCL 2 MG/2ML IJ SOLN
INTRAMUSCULAR | Status: AC
  Filled 2017-11-26: qty 2

## 2017-11-26 MED ORDER — BUPIVACAINE-EPINEPHRINE 0.25% -1:200000 IJ SOLN
INTRAMUSCULAR | Status: AC
  Filled 2017-11-26: qty 1

## 2017-11-26 MED ORDER — FENTANYL CITRATE (PF) 100 MCG/2ML IJ SOLN
INTRAMUSCULAR | Status: DC | PRN
  Administered 2017-11-26: 100 ug via INTRAVENOUS
  Administered 2017-11-26: 50 ug via INTRAVENOUS

## 2017-11-26 MED ORDER — SODIUM CHLORIDE 0.9 % IV SOLN
INTRAVENOUS | Status: DC | PRN
  Administered 2017-11-26: 30 ug/min via INTRAVENOUS

## 2017-11-26 MED ORDER — SODIUM CHLORIDE 0.9 % IR SOLN
Status: DC | PRN
  Administered 2017-11-26: 1000 mL

## 2017-11-26 MED ORDER — SODIUM CHLORIDE 0.9 % IR SOLN
Status: DC | PRN
  Administered 2017-11-26: 3000 mL

## 2017-11-26 MED ORDER — OXYCODONE HCL 5 MG/5ML PO SOLN: 5.0000 mg | Freq: Once | ORAL | Status: DC | PRN

## 2017-11-26 MED ORDER — ONDANSETRON HCL 4 MG/2ML IJ SOLN
INTRAMUSCULAR | Status: AC
  Filled 2017-11-26: qty 4

## 2017-11-26 MED ORDER — SUGAMMADEX SODIUM 200 MG/2ML IV SOLN
INTRAVENOUS | Status: AC
  Filled 2017-11-26: qty 2

## 2017-11-26 MED ORDER — NEOSTIGMINE METHYLSULFATE 5 MG/5ML IV SOSY
PREFILLED_SYRINGE | INTRAVENOUS | Status: AC
  Filled 2017-11-26: qty 5

## 2017-11-26 MED ORDER — PHENYLEPHRINE 40 MCG/ML (10ML) SYRINGE FOR IV PUSH (FOR BLOOD PRESSURE SUPPORT)
PREFILLED_SYRINGE | INTRAVENOUS | Status: DC | PRN
  Administered 2017-11-26 (×2): 80 ug via INTRAVENOUS

## 2017-11-26 MED ORDER — LIDOCAINE 2% (20 MG/ML) 5 ML SYRINGE
INTRAMUSCULAR | Status: AC
  Filled 2017-11-26: qty 5

## 2017-11-26 MED ORDER — GABAPENTIN 300 MG PO CAPS
300.0000 mg | ORAL_CAPSULE | Freq: Two times a day (BID) | ORAL | Status: DC
  Administered 2017-11-26: 300 mg via ORAL
  Filled 2017-11-26: qty 1

## 2017-11-26 MED ORDER — METOCLOPRAMIDE HCL 5 MG/ML IJ SOLN
INTRAMUSCULAR | Status: AC
  Filled 2017-11-26: qty 2

## 2017-11-26 MED ORDER — SODIUM BICARBONATE 8.4 % IV SOLN
INTRAVENOUS | Status: AC
  Filled 2017-11-26: qty 50

## 2017-11-26 MED ORDER — NEOSTIGMINE METHYLSULFATE 10 MG/10ML IV SOLN
INTRAVENOUS | Status: DC | PRN
  Administered 2017-11-26: 4 mg via INTRAVENOUS

## 2017-11-26 MED ORDER — LIDOCAINE 2% (20 MG/ML) 5 ML SYRINGE
INTRAMUSCULAR | Status: DC | PRN
  Administered 2017-11-26: 80 mg via INTRAVENOUS

## 2017-11-26 MED ORDER — DEXTROSE 50 % IV SOLN
INTRAVENOUS | Status: AC
  Administered 2017-11-26: 50 mL
  Filled 2017-11-26: qty 50

## 2017-11-26 MED ORDER — HYDROMORPHONE HCL 1 MG/ML IJ SOLN
INTRAMUSCULAR | Status: AC
  Filled 2017-11-26: qty 1

## 2017-11-26 MED ORDER — HYDROMORPHONE HCL 1 MG/ML IJ SOLN
0.2500 mg | INTRAMUSCULAR | Status: DC | PRN
  Administered 2017-11-26 (×3): 0.5 mg via INTRAVENOUS

## 2017-11-26 MED ORDER — FENTANYL CITRATE (PF) 250 MCG/5ML IJ SOLN
INTRAMUSCULAR | Status: AC
  Filled 2017-11-26: qty 5

## 2017-11-26 MED ORDER — METOCLOPRAMIDE HCL 5 MG/ML IJ SOLN
INTRAMUSCULAR | Status: DC | PRN
  Administered 2017-11-26: 10 mg via INTRAVENOUS

## 2017-11-26 MED ORDER — CISATRACURIUM BESYLATE (PF) 10 MG/5ML IV SOLN
INTRAVENOUS | Status: DC | PRN
  Administered 2017-11-26 (×2): 10 mg via INTRAVENOUS

## 2017-11-26 MED ORDER — HYDROMORPHONE HCL 1 MG/ML IJ SOLN
0.5000 mg | INTRAMUSCULAR | Status: DC | PRN
  Administered 2017-11-26 – 2017-11-27 (×2): 1 mg via INTRAVENOUS
  Administered 2017-11-27: 0.5 mg via INTRAVENOUS
  Administered 2017-11-28 – 2017-12-05 (×25): 1 mg via INTRAVENOUS
  Filled 2017-11-26 (×29): qty 1

## 2017-11-26 SURGICAL SUPPLY — 50 items
APPLIER CLIP ROT 10 11.4 M/L (STAPLE)
BLADE CLIPPER SURG (BLADE) ×3 IMPLANT
CANISTER SUCT 3000ML PPV (MISCELLANEOUS) ×3 IMPLANT
CHLORAPREP W/TINT 26ML (MISCELLANEOUS) ×3 IMPLANT
CLIP APPLIE ROT 10 11.4 M/L (STAPLE) IMPLANT
COVER SURGICAL LIGHT HANDLE (MISCELLANEOUS) ×3 IMPLANT
CUTTER FLEX LINEAR 45M (STAPLE) IMPLANT
DERMABOND ADVANCED (GAUZE/BANDAGES/DRESSINGS) ×1
DERMABOND ADVANCED .7 DNX12 (GAUZE/BANDAGES/DRESSINGS) ×2 IMPLANT
DRAIN CHANNEL 19F RND (DRAIN) ×3 IMPLANT
DRAPE LAPAROSCOPIC ABDOMINAL (DRAPES) ×3 IMPLANT
ELECT REM PT RETURN 9FT ADLT (ELECTROSURGICAL) ×3
ELECTRODE REM PT RTRN 9FT ADLT (ELECTROSURGICAL) ×2 IMPLANT
ENDOLOOP SUT PDS II  0 18 (SUTURE)
ENDOLOOP SUT PDS II 0 18 (SUTURE) IMPLANT
EVACUATOR SILICONE 100CC (DRAIN) ×3 IMPLANT
GLOVE BIO SURGEON STRL SZ7.5 (GLOVE) ×3 IMPLANT
GLOVE BIOGEL PI IND STRL 6 (GLOVE) ×2 IMPLANT
GLOVE BIOGEL PI IND STRL 7.5 (GLOVE) ×2 IMPLANT
GLOVE BIOGEL PI INDICATOR 6 (GLOVE) ×1
GLOVE BIOGEL PI INDICATOR 7.5 (GLOVE) ×1
GLOVE INDICATOR 7.5 STRL GRN (GLOVE) IMPLANT
GLOVE SURG SIGNA 7.5 PF LTX (GLOVE) ×3 IMPLANT
GOWN STRL REUS W/ TWL LRG LVL3 (GOWN DISPOSABLE) ×6 IMPLANT
GOWN STRL REUS W/ TWL XL LVL3 (GOWN DISPOSABLE) ×2 IMPLANT
GOWN STRL REUS W/TWL LRG LVL3 (GOWN DISPOSABLE) ×3
GOWN STRL REUS W/TWL XL LVL3 (GOWN DISPOSABLE) ×1
KIT BASIN OR (CUSTOM PROCEDURE TRAY) ×3 IMPLANT
KIT TURNOVER KIT B (KITS) ×3 IMPLANT
NS IRRIG 1000ML POUR BTL (IV SOLUTION) ×3 IMPLANT
PAD ARMBOARD 7.5X6 YLW CONV (MISCELLANEOUS) ×6 IMPLANT
POUCH RETRIEVAL ECOSAC 10 (ENDOMECHANICALS) ×2 IMPLANT
POUCH RETRIEVAL ECOSAC 10MM (ENDOMECHANICALS) ×1
RELOAD 45 VASCULAR/THIN (ENDOMECHANICALS) IMPLANT
RELOAD STAPLE TA45 3.5 REG BLU (ENDOMECHANICALS) IMPLANT
SET IRRIG TUBING LAPAROSCOPIC (IRRIGATION / IRRIGATOR) ×3 IMPLANT
SHEARS HARMONIC ACE PLUS 36CM (ENDOMECHANICALS) ×3 IMPLANT
SLEEVE ENDOPATH XCEL 5M (ENDOMECHANICALS) ×6 IMPLANT
SPECIMEN JAR SMALL (MISCELLANEOUS) ×3 IMPLANT
SUT ETHILON 2 0 FS 18 (SUTURE) ×3 IMPLANT
SUT MNCRL AB 4-0 PS2 18 (SUTURE) ×3 IMPLANT
SUT VICRYL 0 UR6 27IN ABS (SUTURE) ×3 IMPLANT
TOWEL OR 17X24 6PK STRL BLUE (TOWEL DISPOSABLE) ×3 IMPLANT
TOWEL OR 17X26 10 PK STRL BLUE (TOWEL DISPOSABLE) ×3 IMPLANT
TRAY FOLEY CATH SILVER 16FR (SET/KITS/TRAYS/PACK) ×3 IMPLANT
TRAY LAPAROSCOPIC MC (CUSTOM PROCEDURE TRAY) ×3 IMPLANT
TROCAR XCEL BLUNT TIP 100MML (ENDOMECHANICALS) ×3 IMPLANT
TROCAR XCEL NON-BLD 5MMX100MML (ENDOMECHANICALS) ×3 IMPLANT
TUBING INSUFFLATION (TUBING) ×3 IMPLANT
WATER STERILE IRR 1000ML POUR (IV SOLUTION) ×3 IMPLANT

## 2017-11-26 NOTE — Transfer of Care (Signed)
Immediate Anesthesia Transfer of Care Note  Patient: David [name]  Procedure(s) Performed: LAPAROSCOPIC ABDOMINAL EXPLORATION, DRAINAGE OF APPENDICEAL ABCESS. PLACEMENT OF DRAIN (N/A Abdomen)  Patient Location: PACU  Anesthesia Type:General  Level of Consciousness: awake, alert , oriented and patient cooperative  Airway & Oxygen Therapy: Patient Spontanous Breathing and Patient connected to face mask oxygen  Post-op Assessment: Report given to RN and Post -op Vital signs reviewed and stable  Post vital signs: Reviewed and stable  Last Vitals:  Vitals Value Taken Time  BP 139/63 11/26/2017  6:08 PM  Temp    Pulse 63 11/26/2017  6:12 PM  Resp 16 11/26/2017  6:12 PM  SpO2 96 % 11/26/2017  6:12 PM  Vitals shown include unvalidated device data.  Last Pain:  Vitals:   11/26/17 1422  TempSrc: Oral  PainSc:          Complications: No apparent anesthesia complications

## 2017-11-26 NOTE — Anesthesia Preprocedure Evaluation (Addendum)
Anesthesia Evaluation  Patient identified by MRN, date of birth, ID band Patient awake    Reviewed: Allergy & Precautions, NPO status , Patient's Chart, lab work & pertinent test results  Airway Mallampati: II  TM Distance: >3 FB Neck ROM: Full    Dental  (+) Teeth Intact, Dental Advisory Given   Pulmonary neg pulmonary ROS,    breath sounds clear to auscultation       Cardiovascular hypertension, + CAD, + Cardiac Stents and +CHF   Rhythm:Regular Rate:Normal  Stents in NOV   Neuro/Psych  Headaches,  Neuromuscular disease    GI/Hepatic GERD  ,  Endo/Other  diabetes  Renal/GU Renal InsufficiencyRenal disease     Musculoskeletal  (+) Arthritis ,   Abdominal   Peds  Hematology   Anesthesia Other Findings   Reproductive/Obstetrics                            Anesthesia Physical Anesthesia Plan  ASA: IV  Anesthesia Plan: General   Post-op Pain Management:    Induction:   PONV Risk Score and Plan: 3 and Ondansetron and Treatment may vary due to age or medical condition  Airway Management Planned: Oral ETT  Additional Equipment:   Intra-op Plan:   Post-operative Plan: Extubation in OR  Informed Consent: I have reviewed the patients History and Physical, chart, labs and discussed the procedure including the risks, benefits and alternatives for the proposed anesthesia with the patient or authorized representative who has indicated his/her understanding and acceptance.   Dental advisory given  Plan Discussed with: CRNA  Anesthesia Plan Comments: (Renal, cardiac, and diabetic and PVD)        Anesthesia Quick Evaluation

## 2017-11-26 NOTE — Plan of Care (Signed)
Pain medication given as scheduled PRN

## 2017-11-26 NOTE — Progress Notes (Addendum)
PROGRESS NOTE  David [name]  XBW:[id number] DOB: 06/07/1945 DOA: 11/24/2017 PCP: [name], MD  Outpatient Specialists: Cardiology, Dr. [name] Brief Narrative: OSBORN PULLIN is a 72 y.o. male with a history of CAD s/p stenting Nov 2018 on DAPT, previous CHF with most recent echo normal, T2DM, stage II CKD, HTN who presented with periumbilical abdominal pain found to have appendicitis related to appendicolith on imaging. Also with AKI and hyperglycemia. Surgery consulted and cardiology saw patient to inform decisions regarding antiplatelet therapy. Plavix has been held. Sepsis worsened despite antibiotics and surgery is scheduled.    Assessment & Plan: Principal Problem:   Appendicitis Active Problems:   Hypertension   Diabetes mellitus ([place])   Status post coronary artery stent placement   Acute renal failure superimposed on stage 3 chronic kidney disease (HCC)  Sepsis due to acute appendicitis:  - Worsening leukocytosis, fever, abdominal exam; feel he needs surgery sooner than later. D/w surgery PA who confirms he will have operation following emergent cases by Dr. Lucia Gaskins today. - Continue zosyn  CAD s/p PCI by Dr. [name] 04/06/2017: Maintained on DAPT. Appears euvolemic.  - Cardiology, Dr. [name], evaluated patient. No further work up needed prior to surgery. - Continue ASA, ok to hold plavix with significance of clinical risk imposed by appendicitis.  - Continue statin   Acute renal failure on stage II CKD: Initial improvement with IV fluids, now worsening in light of worsening infection. Making good UOP and not overloaded on exam.  Fortunately not developing hypotension.  - Continue IVF's through today and recheck BMP 7/12.  - Avoid nephrotoxins as able.  - Switch morphine to dilaudid and decrease gabapentin dose. - Monitor UOP, SCr  HTN:  - Continue metoprolol, place hold parameters on bidil, and hold norvasc for now.  IDT2DM: HbA1c 7.5%. - Continue levemir and  sensitive SSI (hold while NPO)  Left hip pain: Reported fall in bathroom a few days PTA with resultant left lateral thigh pain.  - Exam consistent with contusion vs. trochanteric bursitis. Hold NSAIDs. Pt actually not that worried about the pain. XR ordered but declined by patient yesterday. Will defer further work up for now.  - PT/OT post op  DVT prophylaxis: SCDs Code Status: Full Family Communication: None at bedside Disposition Plan: Uncertain.   Consultants:   Surgery  Cardiology  Procedures:   None  Antimicrobials:  Zosyn  Subjective: Abdominal pain is terrible. Feels very weak. Febrile.   Objective: Vitals:   11/25/17 2048 11/25/17 2211 11/26/17 0430 11/26/17 0634  BP: (!) 160/68 (!) 163/78 115/60   Pulse: 78 71 83   Resp: 16 16    Temp: (!) 101.2 F (38.4 C) (!) 101.4 F (38.6 C) (!) 101.7 F (38.7 C) 98.8 F (37.1 C)  TempSrc: Oral Oral Oral Oral  SpO2: 96% 95% 95%   Weight:      Height:        Intake/Output Summary (Last 24 hours) at 11/26/2017 1143 Last data filed at 11/26/2017 0401 Gross per 24 hour  Intake 360 ml  Output 800 ml  Net -440 ml   Filed Weights   11/24/17 1004  Weight: 99.8 kg (220 lb)   Gen: 72 y.o. male in apparent discomfort Pulm: Nonlabored breathing room air. Clear. CV: Regular rate and rhythm. No murmur, rub, or gallop. No JVD, no dependent edema. GI: Abdomen tender in periumbilical area with mild distention and no rebound. +BS.  Ext: Warm, no deformities. Left lateral thigh without tenderness.  Skin:  No rashes, lesions or ulcers on visualized skin.  Neuro: Alert and oriented. No focal neurological deficits. Psych: Judgement and insight appear fair. Mood euthymic & affect congruent. Behavior is appropriate.    Data Reviewed: I have personally reviewed following labs and imaging studies  CBC: Recent Labs  Lab 11/24/17 1015 11/25/17 0419 11/26/17 0334  WBC 16.9* 18.0* 24.6*  NEUTROABS  --   --  21.2*  HGB 9.6* 9.6*  10.1*  HCT 30.3* 30.4* 31.7*  MCV 104.5* 104.5* 104.6*  PLT 278 300 811   Basic Metabolic Panel: Recent Labs  Lab 11/24/17 1015 11/25/17 0419 11/26/17 0334  NA 137 140 139  K 3.4* 3.3* 3.2*  CL 102 101 102  CO2 25 26 24   GLUCOSE 246* 115* 99  BUN 48* 42* 39*  CREATININE 2.53* 2.15* 2.62*  CALCIUM 8.5* 8.4* 8.2*   GFR: Estimated Creatinine Clearance: 33.1 mL/min (A) (by C-G formula based on SCr of 2.62 mg/dL (H)). Liver Function Tests: Recent Labs  Lab 11/24/17 1015  AST 39  ALT 33  ALKPHOS 94  BILITOT 0.7  PROT 6.9  ALBUMIN 2.7*   Recent Labs  Lab 11/24/17 1015  LIPASE 24   No results for input(s): AMMONIA in the last 168 hours. Coagulation Profile: No results for input(s): INR, PROTIME in the last 168 hours. Cardiac Enzymes: No results for input(s): CKTOTAL, CKMB, CKMBINDEX, TROPONINI in the last 168 hours. BNP (last 3 results) No results for input(s): PROBNP in the last 8760 hours. HbA1C: Recent Labs    11/24/17 1015  HGBA1C 7.5*   CBG: Recent Labs  Lab 11/25/17 1621 11/25/17 2217 11/26/17 0102 11/26/17 0614 11/26/17 0809  GLUCAP 125* 93 82 209* 193*   Lipid Profile: No results for input(s): CHOL, HDL, LDLCALC, TRIG, CHOLHDL, LDLDIRECT in the last 72 hours. Thyroid Function Tests: No results for input(s): TSH, T4TOTAL, FREET4, T3FREE, THYROIDAB in the last 72 hours. Anemia Panel: No results for input(s): VITAMINB12, FOLATE, FERRITIN, TIBC, IRON, RETICCTPCT in the last 72 hours. Urine analysis:    Component Value Date/Time   COLORURINE YELLOW 11/24/2017 1525   APPEARANCEUR HAZY (A) 11/24/2017 1525   LABSPEC 1.017 11/24/2017 1525   PHURINE 5.0 11/24/2017 1525   GLUCOSEU NEGATIVE 11/24/2017 1525   HGBUR MODERATE (A) 11/24/2017 1525   BILIRUBINUR NEGATIVE 11/24/2017 1525   KETONESUR NEGATIVE 11/24/2017 1525   PROTEINUR 100 (A) 11/24/2017 1525   UROBILINOGEN 1.0 02/13/2015 1934   NITRITE NEGATIVE 11/24/2017 1525   LEUKOCYTESUR NEGATIVE  11/24/2017 1525   Recent Results (from the past 240 hour(s))  Surgical pcr screen     Status: None   Collection Time: 11/25/17  9:21 PM  Result Value Ref Range Status   MRSA, PCR NEGATIVE NEGATIVE Final   Staphylococcus aureus NEGATIVE NEGATIVE Final    Comment: (NOTE) The Xpert SA Assay (FDA approved for NASAL specimens in patients 60 years of age and older), is one component of a comprehensive surveillance program. It is not intended to diagnose infection nor to guide or monitor treatment. Performed at [facility], [place] [address], [place], [place] [address]       Radiology Studies: Ct Abdomen Pelvis Wo Contrast  Result Date: 11/24/2017 CLINICAL DATA:  Abdominal and periumbilical pain. No bowel movement for 1 week. EXAM: CT ABDOMEN AND PELVIS WITHOUT CONTRAST TECHNIQUE: Multidetector CT imaging of the abdomen and pelvis was performed following the standard protocol without IV contrast. COMPARISON:  February 13, 2015 FINDINGS: Lower chest: No acute abnormality. Hepatobiliary:  No focal liver abnormality is seen. No gallstones, gallbladder wall thickening, or biliary dilatation. Pancreas: Unremarkable. No pancreatic ductal dilatation or surrounding inflammatory changes. Spleen: Normal in size without focal abnormality. Adrenals/Urinary Tract: Adrenal glands are unremarkable. Kidneys are normal, without renal calculi, focal lesion, or hydronephrosis. Bladder is unremarkable. Stomach/Bowel: The stomach and small bowel are normal. The appendix is markedly abnormal with a distal appendicolith and distal dilatation measuring up to 2 cm. No extraluminal gas is noted. It would be difficult to confidently exclude a small extraluminal fluid collection but none is definitively seen. There is wall thickening and periappendiceal stranding. There is wall thickening in the cecum near the takeoff of the appendix which may be secondary to the markedly inflamed appendix. The remainder of the colon is  unremarkable. Vascular/Lymphatic: Minimal atherosclerosis is seen in the nonaneurysmal aorta. No adenopathy. Reproductive: Prostate is unremarkable. Other: No abdominal wall hernia or abnormality. No abdominopelvic ascites. Musculoskeletal: Status post right hip replacement. Degenerative changes in the visualized spine. IMPRESSION: 1. Appendicitis with marked dilatation, wall thickening, and periappendiceal stranding. There is an appendicolith. Evaluation for a small extraluminal fluid collection would be limited without contrast but none are seen. Appendix: Location: Extends from the right lower quadrant of the abdomen into the right side of the pelvis. Abuts the sigmoid colon. Diameter: 2 cm Appendicolith: There is a single appendicoliths. Mucosal hyper-enhancement: Not assessed without contrast. Extraluminal gas: None identified. Periappendiceal collection: Difficult to evaluate without contrast but none seen. 2. Minimal atherosclerosis in the nonaneurysmal aorta. Electronically Signed   By: Dorise Bullion III M.D   On: 11/24/2017 13:18   Dg Chest Port 1 View  Result Date: 11/24/2017 CLINICAL DATA:  Preop cardiovascular exam. EXAM: PORTABLE CHEST 1 VIEW COMPARISON:  03/30/2017 FINDINGS: Cardiomegaly with aortic atherosclerosis. Coronary arterial stent projects over the left heart. Lungs are clear without pulmonary consolidation, overt pulmonary edema, effusion or pneumothorax. Lower cervical degenerative disc and facet arthropathy. No acute osseous abnormality. IMPRESSION: Cardiomegaly with aortic atherosclerosis. No active pulmonary disease. Electronically Signed   By: Ashley Royalty M.D.   On: 11/24/2017 18:20    Scheduled Meds: . amLODipine  5 mg Oral Daily  . aspirin  81 mg Oral Daily  . atorvastatin  40 mg Oral QHS  . dorzolamide-timolol  1 drop Both Eyes BID  . fluticasone  1 spray Each Nare Daily  . gabapentin  600 mg Oral BID  . insulin aspart  0-9 Units Subcutaneous TID WC  . insulin detemir   42 Units Subcutaneous Daily  . isosorbide-hydrALAZINE  1 tablet Oral TID  . metoprolol succinate  100 mg Oral Daily   Continuous Infusions: . sodium chloride 125 mL/hr at 11/25/17 1858  . lactated ringers 125 mL/hr at 11/25/17 1002  . piperacillin-tazobactam (ZOSYN)  IV 3.375 g (11/26/17 0239)     LOS: 2 days   Time spent: 25 minutes.  Patrecia Pour, MD [facility] [email or web address] Password [facility] 11/26/2017, 11:43 AM

## 2017-11-26 NOTE — Progress Notes (Addendum)
[facility] Progress Note     Subjective: CC- abdominal pain Patient is very tired this morning, falls asleep multiple times while I was in the room.  Received morphine, xanax, and tylenol earlier this morning. States that abdominal pain is about the same. Pain is RLQ. Denies n/v. WBC up to 24.6, TMAX 101.7  Objective: Vital signs in last 24 hours: Temp:  [98.7 F (37.1 C)-101.7 F (38.7 C)] 98.8 F (37.1 C) (07/11 0634) Pulse Rate:  [67-83] 83 (07/11 0430) Resp:  [16-17] 16 (07/10 2211) BP: (115-163)/(60-78) 115/60 (07/11 0430) SpO2:  [95 %-99 %] 95 % (07/11 0430) Last BM Date: 11/17/17  Intake/Output from previous day: 07/10 0701 - 07/11 0700 In: 360 [P.O.:360] Out: 800 [Urine:800] Intake/Output this shift: No intake/output data recorded.  PE: Gen:  Alert, NAD, drowsy HEENT: EOM's intact, pupils equal and round Card:  RRR, no M/G/R heard Pulm:  CTAB, no W/R/R, effort normal Abd: Soft, ND, +BS, no HSM, no hernia, TTP RLQ with some guarding Ext:  Calves soft and nontender Skin: no rashes noted, warm and dry  Lab Results:  Recent Labs    11/25/17 0419 11/26/17 0334  WBC 18.0* 24.6*  HGB 9.6* 10.1*  HCT 30.4* 31.7*  PLT 300 301   BMET Recent Labs    11/25/17 0419 11/26/17 0334  NA 140 139  K 3.3* 3.2*  CL 101 102  CO2 26 24  GLUCOSE 115* 99  BUN 42* 39*  CREATININE 2.15* 2.62*  CALCIUM 8.4* 8.2*   PT/INR No results for input(s): LABPROT, INR in the last 72 hours. CMP     Component Value Date/Time   NA 139 11/26/2017 0334   K 3.2 (L) 11/26/2017 0334   CL 102 11/26/2017 0334   CO2 24 11/26/2017 0334   GLUCOSE 99 11/26/2017 0334   BUN 39 (H) 11/26/2017 0334   CREATININE 2.62 (H) 11/26/2017 0334   CALCIUM 8.2 (L) 11/26/2017 0334   PROT 6.9 11/24/2017 1015   ALBUMIN 2.7 (L) 11/24/2017 1015   AST 39 11/24/2017 1015   ALT 33 11/24/2017 1015   ALKPHOS 94 11/24/2017 1015   BILITOT 0.7 11/24/2017 1015   GFRNONAA 23 (L) 11/26/2017  0334   GFRAA 27 (L) 11/26/2017 0334   Lipase     Component Value Date/Time   LIPASE 24 11/24/2017 1015       Studies/Results: Ct Abdomen Pelvis Wo Contrast  Result Date: 11/24/2017 CLINICAL DATA:  Abdominal and periumbilical pain. No bowel movement for 1 week. EXAM: CT ABDOMEN AND PELVIS WITHOUT CONTRAST TECHNIQUE: Multidetector CT imaging of the abdomen and pelvis was performed following the standard protocol without IV contrast. COMPARISON:  February 13, 2015 FINDINGS: Lower chest: No acute abnormality. Hepatobiliary: No focal liver abnormality is seen. No gallstones, gallbladder wall thickening, or biliary dilatation. Pancreas: Unremarkable. No pancreatic ductal dilatation or surrounding inflammatory changes. Spleen: Normal in size without focal abnormality. Adrenals/Urinary Tract: Adrenal glands are unremarkable. Kidneys are normal, without renal calculi, focal lesion, or hydronephrosis. Bladder is unremarkable. Stomach/Bowel: The stomach and small bowel are normal. The appendix is markedly abnormal with a distal appendicolith and distal dilatation measuring up to 2 cm. No extraluminal gas is noted. It would be difficult to confidently exclude a small extraluminal fluid collection but none is definitively seen. There is wall thickening and periappendiceal stranding. There is wall thickening in the cecum near the takeoff of the appendix which may be secondary to the markedly inflamed appendix. The remainder of the colon is  unremarkable. Vascular/Lymphatic: Minimal atherosclerosis is seen in the nonaneurysmal aorta. No adenopathy. Reproductive: Prostate is unremarkable. Other: No abdominal wall hernia or abnormality. No abdominopelvic ascites. Musculoskeletal: Status post right hip replacement. Degenerative changes in the visualized spine. IMPRESSION: 1. Appendicitis with marked dilatation, wall thickening, and periappendiceal stranding. There is an appendicolith. Evaluation for a small extraluminal  fluid collection would be limited without contrast but none are seen. Appendix: Location: Extends from the right lower quadrant of the abdomen into the right side of the pelvis. Abuts the sigmoid colon. Diameter: 2 cm Appendicolith: There is a single appendicoliths. Mucosal hyper-enhancement: Not assessed without contrast. Extraluminal gas: None identified. Periappendiceal collection: Difficult to evaluate without contrast but none seen. 2. Minimal atherosclerosis in the nonaneurysmal aorta. Electronically Signed   By: Dorise Bullion III M.D   On: 11/24/2017 13:18   Dg Chest Port 1 View  Result Date: 11/24/2017 CLINICAL DATA:  Preop cardiovascular exam. EXAM: PORTABLE CHEST 1 VIEW COMPARISON:  03/30/2017 FINDINGS: Cardiomegaly with aortic atherosclerosis. Coronary arterial stent projects over the left heart. Lungs are clear without pulmonary consolidation, overt pulmonary edema, effusion or pneumothorax. Lower cervical degenerative disc and facet arthropathy. No acute osseous abnormality. IMPRESSION: Cardiomegaly with aortic atherosclerosis. No active pulmonary disease. Electronically Signed   By: Ashley Royalty M.D.   On: 11/24/2017 18:20    Anti-infectives: Anti-infectives (From admission, onward)   Start     Dose/Rate Route Frequency Ordered Stop   11/25/17 0300  piperacillin-tazobactam (ZOSYN) IVPB 3.375 g     3.375 g 12.5 mL/hr over 240 Minutes Intravenous Every 8 hours 11/24/17 1951     11/24/17 1930  piperacillin-tazobactam (ZOSYN) IVPB 3.375 g     3.375 g 100 mL/hr over 30 Minutes Intravenous  Once 11/24/17 1924 11/24/17 2229   11/24/17 1330  cefTRIAXone (ROCEPHIN) 2 g in sodium chloride 0.9 % 100 mL IVPB     2 g 200 mL/hr over 30 Minutes Intravenous  Once 11/24/17 1327 11/24/17 1456   11/24/17 1330  metroNIDAZOLE (FLAGYL) IVPB 500 mg     500 mg 100 mL/hr over 60 Minutes Intravenous  Once 11/24/17 1327 11/24/17 1658       Assessment/Plan DM uncontrolled HTN HLD CKD-IV CAD - s/p  stent 04/07/2017 on plavix (last dose 7/9), continue ASA 81mg  Chronic pain (due to back)- sees Dr. Jerilynn Mages. Hardin Negus - at home, on hydrocodone QID  (wife implies that he may take more that that) Back issues - sees Dr. Larose Hires  He was supposed to have back surgery last December  Acute appendicitis - Abdominal pain about the same but WBC up to 24.6 and TMAX 101.7. Keep NPO and continue IV zosyn. Likely OR later today, will discuss with MD.  ID - zosyn 7/9>> FEN - IVF, NPO VTE - SCDs Foley - none   LOS: 2 days    Wellington Hampshire , [facility] 11/26/2017, 7:53 AM Pager: [phone] Consults: [phone]  Agree with above.  With increasing WBC and fever last PM, will proceed with laparoscopic abdominal exploration and appendectomy. He's never had a colonoscopy.  Of note, he has underlying chronic pain issues and chronic back issues.  He is on chronic narcotics, which will make controlling his pain an issue.  Discussed the planned surgery thoroughly with the patient.  Discussed risks including: bleeding (short term off Plavix), infection (which he already has), open surgery, bowel resection, and possible colostomy.  Alphonsa Overall, MD, [facility] Pager: [phone] Office phone:  [phone]

## 2017-11-26 NOTE — Progress Notes (Signed)
Attempted report to short stay x2

## 2017-11-26 NOTE — Op Note (Addendum)
11/24/2017 - 11/26/2017  5:47 PM  PATIENT:  David [name], 72 y.o., male, MRN: [id number]  PREOP DIAGNOSIS:  Acute appendicitis  POSTOP DIAGNOSIS:   Appendiceal abscess, posterior and lateral to cecum {Photos at end of note]  PROCEDURE:   Procedure(s): LAPAROSCOPIC ABDOMINAL EXPLORATION, DRAINAGE OF APPENDICEAL ABCESS. PLACEMENT OF DRAIN  SURGEON:   Alphonsa Overall, M.D.  ASSISTANT:   None  ANESTHESIA:   general  Anesthesiologist: Murvin Natal, MD CRNA: Cleda Daub, CRNA  General  EBL:  minimal  ml  BLOOD ADMINISTERED: none  DRAINS: 19 F Blake drain  LOCAL MEDICATIONS USED:   30 cc 1/4 Marcaine  SPECIMEN:   None  COUNTS CORRECT:  YES  INDICATIONS FOR PROCEDURE:  David [name] is a 72 y.o. (DOB: 1945/12/19) AA male whose primary care physician is [name], MD and comes for laparoscopic exploration for appendiceal abscess.  He has been on Plavix, so we were trying to give this time to reverse.  But he had a fever and increasing WBC, so I decided to proceed with laparoscopic exploration.   The indications and risks of the surgery were explained to the patient.  The risks include, but are not limited to, infection, bleeding, and nerve injury.  PROCEDURE: The patient was taken to OR room #1 at [facility]. He had a Foley catheter placed and an OG tube placed and his abdomen was prepped with ChloraPrep.  His abdomen was sterilely draped.  A timeout was held and surgical checklist run.  I accessed his abdominal cavity through a infraumbilical incision. I placed a 12 mm Hassan trocar through the incision and secured the trocar with a 0 Vicryl suture. I placed 3 additional 5 mm trocars: a 5 mm trocar in the right upper quadrant, a 5 mm trocar car in the left upper abdomen, and a 5 millimeter trocar in the left lower quadrant.  I did an abdominal exploration.  I first identified the liver, gallbladder, and stomach which all looked normal.   The bowel in his  upper abdomen looked normal.  In his lower abdomen, he had evidence of inflammation lateral to the cecum and the cecum stuck along the right peritoneal sidewall. I dissected this off the retroperitoneum and got into a 50 mL collection of pus.  I aspirated and cleaned out this abscess.  I was able to partially mobilize the cecum, because it was densely adhered to the retroperitoneum and peritoneal sidewall.  I turned my attention to the pelvis. He had a looped sigmoid colon was some inflammatory changes behind his bladder and anterior to the distal sigmoid colon. I found no abscess. There was no obvious evidence of malignancy. I thought because of the density of the inflammatory response around the cecum and the inflammation in his pelvis, he would be best served just draining the abscess with no further attempt at surgical intervention. I did not find an appendicolith. I did not clearly identify an appendix.  I placed a 5 mm trocar into the right lower quadrant to place a 19 [place] Blake drain.  The drain was placed by the cecum and into the right pelvis. I irrigated the abdomen with about 600 mL of saline clearing out the abscess cavity.  I then removed the trocars in turn. The umbilical port was closed with 0 Vicryl suture. The skin at each port was closed with 4-0 Monocryl suture and painted with Dermabond.  The sponge and needle count were correct in the case. The patient  was transferred to recovery in good condition.   Pictures are chronologically backwards Drain in place - cecum at 3 o'clock   Lateral cecal abscess (cecum is retracted at 3 o'clock and sigmoid colon is at 6 o'clock)   Alphonsa Overall, MD, [facility] Pager: [phone] Office phone:  [phone]

## 2017-11-26 NOTE — Anesthesia Procedure Notes (Signed)
Procedure Name: Intubation Date/Time: 11/26/2017 4:14 PM Performed by: Cleda Daub, CRNA Pre-anesthesia Checklist: Patient identified, Emergency Drugs available, Suction available and Patient being monitored Patient Re-evaluated:Patient Re-evaluated prior to induction Oxygen Delivery Method: Circle system utilized Preoxygenation: Pre-oxygenation with 100% oxygen Induction Type: IV induction, Rapid sequence and Cricoid Pressure applied Laryngoscope Size: Mac and 3 Grade View: Grade I Tube type: Oral Tube size: 8.0 mm Number of attempts: 1 Airway Equipment and Method: Stylet Placement Confirmation: ETT inserted through vocal cords under direct vision,  positive ETCO2 and breath sounds checked- equal and bilateral Secured at: 24 cm Tube secured with: Tape Dental Injury: Teeth and Oropharynx as per pre-operative assessment

## 2017-11-27 ENCOUNTER — Inpatient Hospital Stay (HOSPITAL_COMMUNITY): Payer: Medicare Other

## 2017-11-27 ENCOUNTER — Encounter (HOSPITAL_COMMUNITY): Payer: Self-pay | Admitting: Surgery

## 2017-11-27 LAB — BASIC METABOLIC PANEL
ANION GAP: 13 (ref 5–15)
BUN: 43 mg/dL — ABNORMAL HIGH (ref 8–23)
CALCIUM: 7.7 mg/dL — AB (ref 8.9–10.3)
CO2: 24 mmol/L (ref 22–32)
Chloride: 101 mmol/L (ref 98–111)
Creatinine, Ser: 3.54 mg/dL — ABNORMAL HIGH (ref 0.61–1.24)
GFR, EST AFRICAN AMERICAN: 19 mL/min — AB (ref >60)
GFR, EST NON AFRICAN AMERICAN: 16 mL/min — AB (ref >60)
GLUCOSE: 140 mg/dL — AB (ref 70–99)
Potassium: 3.7 mmol/L (ref 3.5–5.1)
SODIUM: 138 mmol/L (ref 135–145)

## 2017-11-27 LAB — URINALYSIS, ROUTINE W REFLEX MICROSCOPIC
Bilirubin Urine: NEGATIVE
GLUCOSE, UA: NEGATIVE mg/dL
KETONES UR: NEGATIVE mg/dL
Leukocytes, UA: NEGATIVE
Nitrite: NEGATIVE
PROTEIN: 100 mg/dL — AB
Specific Gravity, Urine: 1.021 (ref 1.005–1.030)
pH: 5 (ref 5.0–8.0)

## 2017-11-27 LAB — CBC
HCT: 28.8 % — ABNORMAL LOW (ref 39.0–52.0)
Hemoglobin: 8.8 g/dL — ABNORMAL LOW (ref 13.0–17.0)
MCH: 32.7 pg (ref 26.0–34.0)
MCHC: 30.6 g/dL (ref 30.0–36.0)
MCV: 107.1 fL — ABNORMAL HIGH (ref 78.0–100.0)
PLATELETS: 302 10*3/uL (ref 150–400)
RBC: 2.69 MIL/uL — ABNORMAL LOW (ref 4.22–5.81)
RDW: 11.9 % (ref 11.5–15.5)
WBC: 17.5 10*3/uL — ABNORMAL HIGH (ref 4.0–10.5)

## 2017-11-27 LAB — GLUCOSE, CAPILLARY
GLUCOSE-CAPILLARY: 192 mg/dL — AB (ref 70–99)
Glucose-Capillary: 107 mg/dL — ABNORMAL HIGH (ref 70–99)
Glucose-Capillary: 137 mg/dL — ABNORMAL HIGH (ref 70–99)
Glucose-Capillary: 150 mg/dL — ABNORMAL HIGH (ref 70–99)
Glucose-Capillary: 151 mg/dL — ABNORMAL HIGH (ref 70–99)
Glucose-Capillary: 165 mg/dL — ABNORMAL HIGH (ref 70–99)

## 2017-11-27 LAB — SODIUM, URINE, RANDOM: Sodium, Ur: 13 mmol/L

## 2017-11-27 LAB — PROTEIN / CREATININE RATIO, URINE
Creatinine, Urine: 245.18 mg/dL
PROTEIN CREATININE RATIO: 0.53 mg/mg{creat} — AB (ref 0.00–0.15)
Total Protein, Urine: 129 mg/dL

## 2017-11-27 MED ORDER — DEXTROSE-NACL 5-0.9 % IV SOLN
INTRAVENOUS | Status: DC
  Administered 2017-11-27 – 2017-11-30 (×3): via INTRAVENOUS

## 2017-11-27 MED ORDER — INSULIN DETEMIR 100 UNIT/ML ~~LOC~~ SOLN
30.0000 [IU] | Freq: Every day | SUBCUTANEOUS | Status: DC
  Administered 2017-11-27 – 2017-12-06 (×10): 30 [IU] via SUBCUTANEOUS
  Filled 2017-11-27 (×10): qty 0.3

## 2017-11-27 NOTE — Progress Notes (Addendum)
[facility] Progress Note  1 Day Post-Op  Subjective: CC-  Patient very tired this morning, having a difficult time keeping his eyes open. Reports abdominal pain and bloating. denies n/v. Passing small amount of flatus. Has not been OOB. Creatinine up to 3.54 today and patient with low UOP, renal u/s has been ordered.  Objective: Vital signs in last 24 hours: Temp:  [97.5 F (36.4 C)-99.7 F (37.6 C)] 98.9 F (37.2 C) (07/12 0412) Pulse Rate:  [52-80] 69 (07/12 0412) Resp:  [8-17] 17 (07/12 0412) BP: (102-155)/(49-92) 133/63 (07/12 0412) SpO2:  [93 %-100 %] 100 % (07/12 0412) Weight:  [220 lb (99.8 kg)] 220 lb (99.8 kg) (07/11 1507) Last BM Date: 11/17/17  Intake/Output from previous day: 07/11 0701 - 07/12 0700 In: 3069 [P.O.:480; I.V.:2371.5; IV Piggyback:217.5] Out: 500 [Urine:350; Drains:140; Blood:10] Intake/Output this shift: Total I/O In: 66 [P.O.:50] Out: -   PE: Gen:  Alert, NAD, drowsy HEENT: EOM's intact, pupils equal and round Card:  RRR Pulm:  CTAB, no W/R/R, effort normal Abd: Soft, moderate distension, few BS heard, lap incisions cdi, drain with minimal serosanguinous drainage in bulb Skin: no rashes noted, warm and dry  Lab Results:  Recent Labs    11/26/17 0334 11/27/17 0525  WBC 24.6* 17.5*  HGB 10.1* 8.8*  HCT 31.7* 28.8*  PLT 301 302   BMET Recent Labs    11/26/17 0334 11/27/17 0525  NA 139 138  K 3.2* 3.7  CL 102 101  CO2 24 24  GLUCOSE 99 140*  BUN 39* 43*  CREATININE 2.62* 3.54*  CALCIUM 8.2* 7.7*   PT/INR No results for input(s): LABPROT, INR in the last 72 hours. CMP     Component Value Date/Time   NA 138 11/27/2017 0525   K 3.7 11/27/2017 0525   CL 101 11/27/2017 0525   CO2 24 11/27/2017 0525   GLUCOSE 140 (H) 11/27/2017 0525   BUN 43 (H) 11/27/2017 0525   CREATININE 3.54 (H) 11/27/2017 0525   CALCIUM 7.7 (L) 11/27/2017 0525   PROT 6.9 11/24/2017 1015   ALBUMIN 2.7 (L) 11/24/2017 1015   AST 39  11/24/2017 1015   ALT 33 11/24/2017 1015   ALKPHOS 94 11/24/2017 1015   BILITOT 0.7 11/24/2017 1015   GFRNONAA 16 (L) 11/27/2017 0525   GFRAA 19 (L) 11/27/2017 0525   Lipase     Component Value Date/Time   LIPASE 24 11/24/2017 1015       Studies/Results: US Renal  Result Date: 11/27/2017 CLINICAL DATA:  Acute kidney injury. EXAM: RENAL / URINARY TRACT ULTRASOUND COMPLETE COMPARISON:  CT abdomen and pelvis 11/24/2017. FINDINGS: Right Kidney: Length: 12.3 cm. Echogenicity within normal limits. No mass or hydronephrosis visualized. Left Kidney: Length: 12.3 cm. Echogenicity within normal limits. No mass or hydronephrosis visualized. Bladder: Appears normal for degree of bladder distention. IMPRESSION: Negative for hydronephrosis.  Negative exam. Electronically Signed   By: Inge Rise M.D.   On: 11/27/2017 09:57    Anti-infectives: Anti-infectives (From admission, onward)   Start     Dose/Rate Route Frequency Ordered Stop   11/25/17 0300  piperacillin-tazobactam (ZOSYN) IVPB 3.375 g     3.375 g 12.5 mL/hr over 240 Minutes Intravenous Every 8 hours 11/24/17 1951     11/24/17 1930  piperacillin-tazobactam (ZOSYN) IVPB 3.375 g     3.375 g 100 mL/hr over 30 Minutes Intravenous  Once 11/24/17 1924 11/24/17 2229   11/24/17 1330  cefTRIAXone (ROCEPHIN) 2 g in sodium chloride 0.9 %  100 mL IVPB     2 g 200 mL/hr over 30 Minutes Intravenous  Once 11/24/17 1327 11/24/17 1456   11/24/17 1330  metroNIDAZOLE (FLAGYL) IVPB 500 mg     500 mg 100 mL/hr over 60 Minutes Intravenous  Once 11/24/17 1327 11/24/17 1658       Assessment/Plan DM uncontrolled HTN HLD CKD-IV CAD - s/p stent 04/07/2017 on plavix (last dose 7/9), continue ASA 81mg  Chronic back pain - sees Dr. Jerilynn Mages. Hardin Negus - at home, on hydrocodone QID  (wife implies that he may take more that that). Also sees Dr. Christella Noa and was supposed to have back surgery last December ARF - Cr up to 3.54 today and low UOP, medicine team  getting renal u/s ABL anemia - Hg 8.8, monitor  Acute appendicitis S/p LAPAROSCOPIC ABDOMINAL EXPLORATION, DRAINAGE OF APPENDICEAL ABCESS. PLACEMENT OF DRAIN 7/11 Dr. Lucia Gaskins - POD 1 - drain with 140cc serosanguinous drainage - never had a colonoscopy before  ID - zosyn 7/9>> FEN - IVF, sips of clears VTE - SCDs Foley - none  Plan: Spoke with patient's nurse and he needs to get OOB at least to chair today.   Continue IV zosyn and drain.   Nebo for sips of clears as tolerated. Labs in AM.   LOS: 3 days   Wellington Hampshire , [facility] 11/27/2017, 10:33 AM Pager: [phone] Consults: [phone]  Agree with above. Wife at the bedside. I went over the operation with the patient.  He is somewhat repetitive with his questions. Bloated, but not tender.  Alphonsa Overall, MD, [facility] Pager: [phone] Office phone:  [phone]

## 2017-11-27 NOTE — Progress Notes (Signed)
PROGRESS NOTE  SOPHIA CUBERO  WUJ:[id number] DOB: 1946-03-04 DOA: 11/24/2017 PCP: [name], MD  Outpatient Specialists: Cardiology, Dr. [name] Brief Narrative: RUXIN RANSOME is a 72 y.o. male with a history of CAD s/p stenting Nov 2018 on DAPT, previous CHF with most recent echo normal, T2DM, stage II CKD, HTN who presented with periumbilical abdominal pain found to have appendicitis related to appendicolith on imaging. Also with AKI and hyperglycemia. Surgery consulted and cardiology saw patient to inform decisions regarding antiplatelet therapy. Plavix has been held. Sepsis worsened despite antibiotics and surgery is scheduled.    Assessment & Plan: Principal Problem:   Appendicitis Active Problems:   Hypertension   Diabetes mellitus ([place])   Status post coronary artery stent placement   Acute renal failure superimposed on stage 3 chronic kidney disease (HCC)  Sepsis due to acute appendiceal abscess: s/p drainage and drain placement 7/11 by Dr. Lucia Gaskins:  - Diet per surgery.  - Discussed with pt, needs to mobilize - Continue zosyn  CAD s/p PCI by Dr. [name] 04/06/2017: Maintained on DAPT. Appears euvolemic.  - Cardiology, Dr. [name], evaluated patient. Continue ASA, ok to hold plavix for surgery. Restart as soon as reasonable per surgery.  - Continue statin   Acute renal failure on stage II CKD: Initial improvement with IV fluids, now worsening in light of worsening infection and postop. UOP has dropped off. Renal U/S negative.  - D/w RN and pt, need strict I/O. Pt woke up considerably at the mention of possible foley. Will hold for now.  - Give isotonic fluids and monitor BMP, CBC in AM. No edema or crackles on exam. If worsening tomorrow, will ask for renal assistance.  - DC'ed gabapentin with lethargy and worsening renal function.  - Urinalysis and lytes not collected yet. Will follow.  Acute blood loss anemia on anemia of chronic disease: Hgb down 10.1 > 8.8 postop with  stable platelet count. - Monitor Hgb in AM, transfuse for hgb < 8 w/CAD.   HTN:  - Continue metoprolol, place hold parameters on bidil, and hold norvasc for now to minimize hypotension.   IDT2DM: HbA1c 7.5%. - Continue levemir and sensitive SSI  Left hip pain: Reported fall in bathroom a few days PTA with resultant left lateral thigh pain.  - Exam benign. Avoid NSAIDs. - PT/OT post op have been ordered  DVT prophylaxis: SCDs Code Status: Full Family Communication: None at bedside Disposition Plan: Uncertain.   Consultants:   Surgery  Cardiology  Procedures:   None  Antimicrobials:  Zosyn  Subjective: Has been lethargic most of the day, hasn't gotten up. Only some flatus but abdominal pain is significant. Not urinating much. Fever curve improving.   Objective: Vitals:   11/26/17 2020 11/26/17 2114 11/26/17 2200 11/27/17 0412  BP: (!) 105/54 (!) 115/53 (!) 155/92 133/63  Pulse: (!) 56  80 69  Resp: 17  16 17   Temp: 97.9 F (36.6 C)  99.7 F (37.6 C) 98.9 F (37.2 C)  TempSrc: Oral  Oral Oral  SpO2: 98%  99% 100%  Weight:      Height:        Intake/Output Summary (Last 24 hours) at 11/27/2017 1207 Last data filed at 11/27/2017 0850 Gross per 24 hour  Intake 3118.95 ml  Output 500 ml  Net 2618.95 ml   Filed Weights   11/24/17 1004 11/26/17 1507  Weight: 99.8 kg (220 lb) 99.8 kg (220 lb)  Gen: 72 y.o. male in no distress Pulm: Nonlabored  breathing room air. Clear. CV: Regular rate and rhythm. No murmur, rub, or gallop. No JVD, no significant dependent edema. GI: Abdomen distended but soft, moderate tenderness without rebound. Lap sites c/d/i with RLQ drain serosanguinous output. +BS.  Ext: Warm, no deformities Skin: No other rashes, lesions or ulcers on visualized skin.  Neuro: Sleeping initially but completely alert after mention of foley catheter. Oriented. No focal neurological deficits. Psych: Judgement and insight appear fair. Mood euthymic & affect  congruent. Behavior is appropriate.     Data Reviewed: I have personally reviewed following labs and imaging studies  CBC: Recent Labs  Lab 11/24/17 1015 11/25/17 0419 11/26/17 0334 11/27/17 0525  WBC 16.9* 18.0* 24.6* 17.5*  NEUTROABS  --   --  21.2*  --   HGB 9.6* 9.6* 10.1* 8.8*  HCT 30.3* 30.4* 31.7* 28.8*  MCV 104.5* 104.5* 104.6* 107.1*  PLT 278 300 301 979   Basic Metabolic Panel: Recent Labs  Lab 11/24/17 1015 11/25/17 0419 11/26/17 0334 11/27/17 0525  NA 137 140 139 138  K 3.4* 3.3* 3.2* 3.7  CL 102 101 102 101  CO2 25 26 24 24   GLUCOSE 246* 115* 99 140*  BUN 48* 42* 39* 43*  CREATININE 2.53* 2.15* 2.62* 3.54*  CALCIUM 8.5* 8.4* 8.2* 7.7*   GFR: Estimated Creatinine Clearance: 24.5 mL/min (A) (by C-G formula based on SCr of 3.54 mg/dL (H)). Liver Function Tests: Recent Labs  Lab 11/24/17 1015  AST 39  ALT 33  ALKPHOS 94  BILITOT 0.7  PROT 6.9  ALBUMIN 2.7*   Recent Labs  Lab 11/24/17 1015  LIPASE 24   No results for input(s): AMMONIA in the last 168 hours. Coagulation Profile: No results for input(s): INR, PROTIME in the last 168 hours. Cardiac Enzymes: No results for input(s): CKTOTAL, CKMB, CKMBINDEX, TROPONINI in the last 168 hours. BNP (last 3 results) No results for input(s): PROBNP in the last 8760 hours. HbA1C: No results for input(s): HGBA1C in the last 72 hours. CBG: Recent Labs  Lab 11/26/17 2133 11/26/17 2202 11/27/17 0017 11/27/17 0405 11/27/17 0647  GLUCAP 55* 174* 150* 151* 107*   Lipid Profile: No results for input(s): CHOL, HDL, LDLCALC, TRIG, CHOLHDL, LDLDIRECT in the last 72 hours. Thyroid Function Tests: No results for input(s): TSH, T4TOTAL, FREET4, T3FREE, THYROIDAB in the last 72 hours. Anemia Panel: No results for input(s): VITAMINB12, FOLATE, FERRITIN, TIBC, IRON, RETICCTPCT in the last 72 hours. Urine analysis:    Component Value Date/Time   COLORURINE YELLOW 11/24/2017 1525   APPEARANCEUR HAZY (A)  11/24/2017 1525   LABSPEC 1.017 11/24/2017 1525   PHURINE 5.0 11/24/2017 1525   GLUCOSEU NEGATIVE 11/24/2017 1525   HGBUR MODERATE (A) 11/24/2017 1525   BILIRUBINUR NEGATIVE 11/24/2017 1525   KETONESUR NEGATIVE 11/24/2017 1525   PROTEINUR 100 (A) 11/24/2017 1525   UROBILINOGEN 1.0 02/13/2015 1934   NITRITE NEGATIVE 11/24/2017 1525   LEUKOCYTESUR NEGATIVE 11/24/2017 1525   Recent Results (from the past 240 hour(s))  Surgical pcr screen     Status: None   Collection Time: 11/25/17  9:21 PM  Result Value Ref Range Status   MRSA, PCR NEGATIVE NEGATIVE Final   Staphylococcus aureus NEGATIVE NEGATIVE Final    Comment: (NOTE) The Xpert SA Assay (FDA approved for NASAL specimens in patients 3 years of age and older), is one component of a comprehensive surveillance program. It is not intended to diagnose infection nor to guide or monitor treatment. Performed at [facility], [place]  [address], [place], [place] [address]       Radiology Studies: US Renal  Result Date: 11/27/2017 CLINICAL DATA:  Acute kidney injury. EXAM: RENAL / URINARY TRACT ULTRASOUND COMPLETE COMPARISON:  CT abdomen and pelvis 11/24/2017. FINDINGS: Right Kidney: Length: 12.3 cm. Echogenicity within normal limits. No mass or hydronephrosis visualized. Left Kidney: Length: 12.3 cm. Echogenicity within normal limits. No mass or hydronephrosis visualized. Bladder: Appears normal for degree of bladder distention. IMPRESSION: Negative for hydronephrosis.  Negative exam. Electronically Signed   By: Inge Rise M.D.   On: 11/27/2017 09:57    Scheduled Meds: . aspirin  81 mg Oral Daily  . atorvastatin  40 mg Oral QHS  . dorzolamide-timolol  1 drop Both Eyes BID  . fluticasone  1 spray Each Nare Daily  . insulin aspart  0-9 Units Subcutaneous TID WC  . insulin detemir  30 Units Subcutaneous Daily  . isosorbide-hydrALAZINE  1 tablet Oral TID  . metoprolol succinate  100 mg Oral Daily   Continuous Infusions: .  sodium chloride 10 mL/hr at 11/26/17 1509  . dextrose 5 % and 0.9% NaCl    . piperacillin-tazobactam (ZOSYN)  IV 3.375 g (11/27/17 1159)     LOS: 3 days   Time spent: 35 minutes.  Patrecia Pour, MD [facility] [email or web address] Password [facility] 11/27/2017, 12:07 PM

## 2017-11-27 NOTE — Care Management Important Message (Signed)
Important Message  Patient Details  Name: David [name] MRN: [id number] Date of Birth: June 04, 1945   Medicare Important Message Given:  Yes    Orbie Pyo 11/27/2017, 3:44 PM

## 2017-11-27 NOTE — Progress Notes (Signed)
CBG 61. Given two cups of apple juice.  CBG @ 2133: 55. Patient given 50% dextrose. CBG rechecked @ 2200 174.   CBG rechecked at 0000 150. Will continue to monitor patient as patient is asleep.

## 2017-11-27 NOTE — Anesthesia Postprocedure Evaluation (Signed)
Anesthesia Post Note  Patient: David [name]  Procedure(s) Performed: LAPAROSCOPIC ABDOMINAL EXPLORATION, DRAINAGE OF APPENDICEAL ABCESS. PLACEMENT OF DRAIN (N/A Abdomen)     Patient location during evaluation: PACU Anesthesia Type: General Level of consciousness: awake and alert Pain management: pain level controlled Vital Signs Assessment: post-procedure vital signs reviewed and stable Respiratory status: spontaneous breathing, nonlabored ventilation, respiratory function stable and patient connected to nasal cannula oxygen Cardiovascular status: blood pressure returned to baseline and stable Postop Assessment: no apparent nausea or vomiting Anesthetic complications: no    Last Vitals:  Vitals:   11/26/17 2200 11/27/17 0412  BP: (!) 155/92 133/63  Pulse: 80 69  Resp: 16 17  Temp: 37.6 C 37.2 C  SpO2: 99% 100%    Last Pain:  Vitals:   11/27/17 0412  TempSrc: Oral  PainSc:                  Karyl Kinnier Pearlene Teat

## 2017-11-27 NOTE — Evaluation (Signed)
Physical Therapy Evaluation Patient Details Name: David [name] MRN: [id number] DOB: 1945/11/02 Today's Date: 11/27/2017   History of Present Illness  Pt is a 72 y/o male admitted secondary to worsening abdominal pain and constipation. Pt is s/p laparascopic abdominal exploration and drainage of appendiceal abscess with placement of drain on 7/11. PMH includes HTN, DM, CAD s/p stent placement, CHF, R THA, CKD, and back surgery per pt report.   Clinical Impression  Pt admitted secondary to problem above with deficits below. Pt with very poor safety awareness and awareness of deficits. Pt ignoring all safety cues throughout session despite max education. Requiring mod A +2 throughout mobility tasks and pt with bilat knee buckling. Feel pt is currently a very high fall risk and will have difficulty performing ADL tasks. Will continue to follow acutely to maximize functional mobility independence and safety.     Follow Up Recommendations SNF;Supervision/Assistance - 24 hour    Equipment Recommendations  3in1 (PT)    Recommendations for Other Services OT consult     Precautions / Restrictions Precautions Precautions: Fall Precaution Comments: JP drain  Restrictions Weight Bearing Restrictions: No      Mobility  Bed Mobility               General bed mobility comments: Sitting EOB with NT upon entry.   Transfers Overall transfer level: Needs assistance Equipment used: Rolling walker (2 wheeled) Transfers: Sit to/from [facility] Sit to Stand: Mod assist;+2 physical assistance Stand pivot transfers: Mod assist       General transfer comment: Mod A +2 to stand. BLE knee buckling noted and required seated rest and second attempt to gain stability. Pt ignoring safety cues to use BSC to increase safety, and ambulated to bathroom despite cues not to do so. Performed stand pivot transfer from toilet to recliner after finishing toileting task.    Ambulation/Gait Ambulation/Gait assistance: Mod assist;+2 physical assistance Gait Distance (Feet): 10 Feet Assistive device: Rolling walker (2 wheeled) Gait Pattern/deviations: Step-through pattern;Decreased stride length;Drifts right/left;Trunk flexed Gait velocity: Decreased    General Gait Details: Pt with very poor safety awareness during gait. Asked pt to perform stand pivot, however, pt ignoring cues and began ambulation to bathroom. HAd NT assist with gait. Pt with poor safety awareness with use of RW and not responding to cues for appropriate use. Very unsteady with 1 instance of bilat knee buckling.   Stairs            Wheelchair Mobility    Modified Rankin (Stroke Patients Only)       Balance Overall balance assessment: Needs assistance Sitting-balance support: No upper extremity supported;Feet supported Sitting balance-Leahy Scale: Fair     Standing balance support: Bilateral upper extremity supported;During functional activity Standing balance-Leahy Scale: Poor Standing balance comment: Reliant on BUE support and external support.                              Pertinent Vitals/Pain Pain Assessment: Faces Faces Pain Scale: Hurts little more Pain Location: abdominal pain  Pain Descriptors / Indicators: Aching;Operative site guarding Pain Intervention(s): Limited activity within patient's tolerance;Monitored during session;Repositioned    Home Living Family/patient expects to be discharged to:: Private residence Living Arrangements: Spouse/significant other Available Help at Discharge: Family;Available PRN/intermittently Type of Home: House Home Access: Stairs to enter Entrance Stairs-Rails: Right;Left;Can reach both Entrance Stairs-Number of Steps: 3 Home Layout: Two level Home Equipment: Walker - 2 wheels;Cane -  quad      Prior Function Level of Independence: Independent with assistive device(s)         Comments: Reports he uses a  quad cane      Hand Dominance   Dominant Hand: Right    Extremity/Trunk Assessment   Upper Extremity Assessment Upper Extremity Assessment: Defer to OT evaluation(reports peripheral neuropathy in hands )    Lower Extremity Assessment Lower Extremity Assessment: RLE deficits/detail;LLE deficits/detail;Generalized weakness RLE Deficits / Details: Decreased coordination and knee buckling noted.  RLE Coordination: decreased fine motor;decreased gross motor LLE Deficits / Details: Decreased coordination and knee buckling noted.  LLE Coordination: decreased fine motor;decreased gross motor    Cervical / Trunk Assessment Cervical / Trunk Assessment: Kyphotic  Communication   Communication: No difficulties  Cognition Arousal/Alertness: Awake/alert Behavior During Therapy: Impulsive Overall Cognitive Status: No family/caregiver present to determine baseline cognitive functioning Area of Impairment: Following commands;Safety/judgement;Problem solving                       Following Commands: Follows one step commands inconsistently Safety/Judgement: Decreased awareness of safety;Decreased awareness of deficits   Problem Solving: Slow processing;Difficulty sequencing;Requires verbal cues;Requires tactile cues General Comments: Pt very unaware of safety and current deficits. Ignored safety cues for mobility throughout session with PT.       General Comments General comments (skin integrity, edema, etc.): No family present during session.     Exercises     Assessment/Plan    PT Assessment Patient needs continued PT services  PT Problem List Decreased strength;Decreased balance;Decreased mobility;Decreased coordination;Decreased cognition;Decreased knowledge of use of DME;Decreased safety awareness;Pain       PT Treatment Interventions DME instruction;Gait training;Stair training;Functional mobility training;Therapeutic activities;Balance training;Therapeutic  exercise;Patient/family education;Cognitive remediation    PT Goals (Current goals can be found in the Care Plan section)  Acute Rehab PT Goals Patient Stated Goal: to be able to walk  PT Goal Formulation: With patient Time For Goal Achievement: 12/11/17 Potential to Achieve Goals: Fair    Frequency Min 2X/week   Barriers to discharge        Co-evaluation               AM-PAC PT "6 Clicks" Daily Activity  Outcome Measure Difficulty turning over in bed (including adjusting bedclothes, sheets and blankets)?: A Little Difficulty moving from lying on back to sitting on the side of the bed? : Unable Difficulty sitting down on and standing up from a chair with arms (e.g., wheelchair, bedside commode, etc,.)?: Unable Help needed moving to and from a bed to chair (including a wheelchair)?: A Lot Help needed walking in hospital room?: A Lot Help needed climbing 3-5 steps with a railing? : Total 6 Click Score: 10    End of Session Equipment Utilized During Treatment: Gait belt Activity Tolerance: Patient tolerated treatment well Patient left: in chair;with call bell/phone within reach;with chair alarm set Nurse Communication: Mobility status PT Visit Diagnosis: Other abnormalities of gait and mobility (R26.89);Unsteadiness on feet (R26.81);Muscle weakness (generalized) (M62.81);Pain Pain - part of body: (abdomen )    Time: 4401-0272 PT Time Calculation (min) (ACUTE ONLY): 30 min   Charges:   PT Evaluation $PT Eval Moderate Complexity: 1 Mod PT Treatments $Therapeutic Activity: 8-22 mins   PT G Codes:        Leighton Ruff, PT, DPT  Acute Rehabilitation Services  Pager: [phone]   Rudean Hitt 11/27/2017, 1:07 PM

## 2017-11-28 LAB — CBC
HCT: 29.7 % — ABNORMAL LOW (ref 39.0–52.0)
Hemoglobin: 8.9 g/dL — ABNORMAL LOW (ref 13.0–17.0)
MCH: 32.5 pg (ref 26.0–34.0)
MCHC: 30 g/dL (ref 30.0–36.0)
MCV: 108.4 fL — ABNORMAL HIGH (ref 78.0–100.0)
PLATELETS: 316 10*3/uL (ref 150–400)
RBC: 2.74 MIL/uL — AB (ref 4.22–5.81)
RDW: 11.9 % (ref 11.5–15.5)
WBC: 17.4 10*3/uL — ABNORMAL HIGH (ref 4.0–10.5)

## 2017-11-28 LAB — BASIC METABOLIC PANEL
Anion gap: 11 (ref 5–15)
BUN: 50 mg/dL — AB (ref 8–23)
CALCIUM: 7.9 mg/dL — AB (ref 8.9–10.3)
CO2: 22 mmol/L (ref 22–32)
Chloride: 103 mmol/L (ref 98–111)
Creatinine, Ser: 4.61 mg/dL — ABNORMAL HIGH (ref 0.61–1.24)
GFR calc Af Amer: 14 mL/min — ABNORMAL LOW (ref >60)
GFR calc non Af Amer: 12 mL/min — ABNORMAL LOW (ref >60)
GLUCOSE: 126 mg/dL — AB (ref 70–99)
POTASSIUM: 3.7 mmol/L (ref 3.5–5.1)
SODIUM: 136 mmol/L (ref 135–145)

## 2017-11-28 LAB — GLUCOSE, CAPILLARY
GLUCOSE-CAPILLARY: 115 mg/dL — AB (ref 70–99)
GLUCOSE-CAPILLARY: 106 mg/dL — AB (ref 70–99)
GLUCOSE-CAPILLARY: 95 mg/dL (ref 70–99)
GLUCOSE-CAPILLARY: 161 mg/dL — AB (ref 70–99)

## 2017-11-28 LAB — SODIUM, URINE, RANDOM: SODIUM UR: 13 mmol/L

## 2017-11-28 LAB — CREATININE, URINE, RANDOM: Creatinine, Urine: 255.38 mg/dL

## 2017-11-28 MED ORDER — PIPERACILLIN-TAZOBACTAM IN DEX 2-0.25 GM/50ML IV SOLN
2.2500 g | Freq: Three times a day (TID) | INTRAVENOUS | Status: DC
  Administered 2017-11-28 – 2017-12-02 (×11): 2.25 g via INTRAVENOUS
  Filled 2017-11-28 (×13): qty 50

## 2017-11-28 MED ORDER — LACTATED RINGERS IV BOLUS: 1000.0000 mL | Freq: Once | INTRAVENOUS | Status: DC

## 2017-11-28 NOTE — Consult Note (Signed)
Reason for Consult: AKI Referring Physician:  Dr. Bonner Puna  Chief Complaint: Abdominal pain  Assessment/Plan: 1. Acute kidney injury on CKD III Cr 1.5-1.6 03/2017 with possible progression but certainly with an acute component of kidney injury likely ATN with the sepsis w/ the appendiceal abscess  + hypotensive episodes.  - Will look under light microscopy but likely ATN. - Proteinuria from the diabetes which the pt has had for 20 years with neuropathy as well. - No acute indication for dialysis; would just give maintenance IVF for now and not be aggressive with the IVF. He has plenty of fluid onboard by history and exam.  - decrease rate to 63m/hr - Renal dose medications for a GFR under 30. - Pt not on any nephrotoxic agents. - ultrasound negative for obstructive etiology.  2. HTN - ok to continue but agree with holding the norvasc to increase perfusion. 3. DM 4. Sepsis s/p drainage of appendiceal abscess on Zosyn - fever curve trending down after surgery. 5. Anemia    HPI: RABHINAV MAYORQUINis an 72y.o. male CKD IIIA, HTN, DM, CASHD with stenting 03/2017, CHF p/w abdominal pain, constipation alternating w/ diarrhea. Pain started in the periumbilical region intensifying to the need to come to the ED. CT  showed possible appendicitis and eventually the pt had a  exlap with drainage of appendiceal abscess + drain on 7/11 by Dr. NLucia Gaskins Pt initially treated with Rocephin and Flagyl before transitioning to Zosyn. His cr was in the 1.5-1.6 range in 03/2017 but during the hospitalization it was 2.53 initially coming down to 2.15 and then steadily increasing to 4.61 despite hydration. He is actually +2850 mL at time of consultation. During the surgery systolic BP dropped as low as in the 60's on 2 different times with MAP dropping as low as low 50's.  ROS Pertinent items are noted in HPI.  Chemistry and CBC: Creatinine, Ser  Date/Time Value Ref Range Status  11/28/2017 03:40 AM 4.61 (H) 0.61  - 1.24 mg/dL Final  11/27/2017 05:25 AM 3.54 (H) 0.61 - 1.24 mg/dL Final  11/26/2017 03:34 AM 2.62 (H) 0.61 - 1.24 mg/dL Final  11/25/2017 04:19 AM 2.15 (H) 0.61 - 1.24 mg/dL Final  11/24/2017 10:15 AM 2.53 (H) 0.61 - 1.24 mg/dL Final  04/09/2017 06:47 AM 1.63 (H) 0.61 - 1.24 mg/dL Final  04/08/2017 05:25 AM 1.58 (H) 0.61 - 1.24 mg/dL Final  04/07/2017 01:56 AM 1.51 (H) 0.61 - 1.24 mg/dL Final  04/06/2017 02:35 AM 1.63 (H) 0.61 - 1.24 mg/dL Final  04/05/2017 05:25 AM 1.64 (H) 0.61 - 1.24 mg/dL Final  04/04/2017 11:16 AM 1.41 (H) 0.61 - 1.24 mg/dL Final  04/03/2017 06:03 AM 1.44 (H) 0.61 - 1.24 mg/dL Final  04/02/2017 09:26 AM 1.46 (H) 0.61 - 1.24 mg/dL Final  04/01/2017 02:51 AM 1.44 (H) 0.61 - 1.24 mg/dL Final  03/31/2017 02:53 AM 1.58 (H) 0.61 - 1.24 mg/dL Final  03/30/2017 03:01 AM 1.59 (H) 0.61 - 1.24 mg/dL Final  03/29/2017 01:02 AM 1.32 (H) 0.61 - 1.24 mg/dL Final  09/08/2016 05:08 AM 1.39 (H) 0.61 - 1.24 mg/dL Final  09/07/2016 05:57 AM 1.70 (H) 0.61 - 1.24 mg/dL Final  09/06/2016 12:29 PM 2.05 (H) 0.61 - 1.24 mg/dL Final  09/06/2016 05:23 AM 2.07 (H) 0.61 - 1.24 mg/dL Final  09/05/2016 03:24 AM 2.06 (H) 0.61 - 1.24 mg/dL Final  09/04/2016 01:19 AM 1.27 (H) 0.61 - 1.24 mg/dL Final  09/03/2016 06:02 PM 1.27 (H) 0.61 - 1.24 mg/dL Final  07/09/2016  07:19 AM 1.25 (H) 0.61 - 1.24 mg/dL Final  02/13/2015 03:58 PM 0.87 0.61 - 1.24 mg/dL Final  02/08/2015 07:25 AM 1.22 0.61 - 1.24 mg/dL Final  02/07/2015 01:06 AM 1.69 (H) 0.61 - 1.24 mg/dL Final  02/06/2015 07:35 PM 1.84 (H) 0.61 - 1.24 mg/dL Final  10/09/2013 11:54 AM 0.78 0.50 - 1.35 mg/dL Final  09/17/2013 04:41 AM 1.19 0.50 - 1.35 mg/dL Final  09/16/2013 06:42 PM 1.28 0.50 - 1.35 mg/dL Final    Comment:    DELTA CHECK NOTED  09/15/2013 07:54 AM 2.52 (H) 0.50 - 1.35 mg/dL Final    Comment:    RESULTS VERIFIED VIA RECOLLECT  09/14/2013 04:47 AM 0.97 0.50 - 1.35 mg/dL Final  09/13/2013 04:55 PM 0.89 0.50 - 1.35 mg/dL Final   08/18/2013 09:30 AM 0.83 0.50 - 1.35 mg/dL Final  08/16/2013 05:26 AM 0.85 0.50 - 1.35 mg/dL Final  08/15/2013 03:25 AM 0.72 0.50 - 1.35 mg/dL Final  08/14/2013 03:10 PM 0.75 0.50 - 1.35 mg/dL Final  08/14/2013 11:15 AM 0.71 0.50 - 1.35 mg/dL Final  08/14/2013 02:21 AM 0.66 0.50 - 1.35 mg/dL Final  08/13/2013 07:11 PM 0.69 0.50 - 1.35 mg/dL Final   Recent Labs  Lab 11/24/17 1015 11/25/17 0419 11/26/17 0334 11/27/17 0525 11/28/17 0340  NA 137 140 139 138 136  K 3.4* 3.3* 3.2* 3.7 3.7  CL 102 101 102 101 103  CO2 _0 GLUCOSE 246* 115* 99 140* 126*  BUN 48* 42* 39* 43* 50*  CREATININE 2.53* 2.15* 2.62* 3.54* 4.61*  CALCIUM 8.5* 8.4* 8.2* 7.7* 7.9*   Recent Labs  Lab 11/25/17 0419 11/26/17 0334 11/27/17 0525 11/28/17 0340  WBC 18.0* 24.6* 17.5* 17.4*  NEUTROABS  --  21.2*  --   --   HGB 9.6* 10.1* 8.8* 8.9*  HCT 30.4* 31.7* 28.8* 29.7*  MCV 104.5* 104.6* 107.1* 108.4*  PLT 300 301 302 316   Liver Function Tests: Recent Labs  Lab 11/24/17 1015  AST 39  ALT 33  ALKPHOS 94  BILITOT 0.7  PROT 6.9  ALBUMIN 2.7*   Recent Labs  Lab 11/24/17 1015  LIPASE 24   No results for input(s): AMMONIA in the last 168 hours. Cardiac Enzymes: No results for input(s): CKTOTAL, CKMB, CKMBINDEX, TROPONINI in the last 168 hours. Iron Studies: No results for input(s): IRON, TIBC, TRANSFERRIN, FERRITIN in the last 72 hours. PT/INR: _1 (inr:5)  Xrays/Other Studies: ) Results for orders placed or performed during the hospital encounter of 11/24/17 (from the past 48 hour(s))  Glucose, capillary     Status: Abnormal   Collection Time: 11/26/17 11:59 AM  Result Value Ref Range   Glucose-Capillary 162 (H) 70 - 99 mg/dL  Glucose, capillary     Status: Abnormal   Collection Time: 11/26/17  2:56 PM  Result Value Ref Range   Glucose-Capillary 138 (H) 70 - 99 mg/dL   Comment 1 Notify RN    Comment 2 Document in Chart   Glucose, capillary     Status: Abnormal    Collection Time: 11/26/17  6:18 PM  Result Value Ref Range   Glucose-Capillary 106 (H) 70 - 99 mg/dL  Glucose, capillary     Status: Abnormal   Collection Time: 11/26/17  9:08 PM  Result Value Ref Range   Glucose-Capillary 61 (L) 70 - 99 mg/dL  Glucose, capillary     Status: Abnormal   Collection Time: 11/26/17  9:33 PM  Result Value Ref Range  Glucose-Capillary 55 (L) 70 - 99 mg/dL  Glucose, capillary     Status: Abnormal   Collection Time: 11/26/17 10:02 PM  Result Value Ref Range   Glucose-Capillary 174 (H) 70 - 99 mg/dL  Glucose, capillary     Status: Abnormal   Collection Time: 11/27/17 12:17 AM  Result Value Ref Range   Glucose-Capillary 150 (H) 70 - 99 mg/dL  Glucose, capillary     Status: Abnormal   Collection Time: 11/27/17  4:05 AM  Result Value Ref Range   Glucose-Capillary 151 (H) 70 - 99 mg/dL  CBC     Status: Abnormal   Collection Time: 11/27/17  5:25 AM  Result Value Ref Range   WBC 17.5 (H) 4.0 - 10.5 K/uL   RBC 2.69 (L) 4.22 - 5.81 MIL/uL   Hemoglobin 8.8 (L) 13.0 - 17.0 g/dL   HCT 28.8 (L) 39.0 - 52.0 %   MCV 107.1 (H) 78.0 - 100.0 fL   MCH 32.7 26.0 - 34.0 pg   MCHC 30.6 30.0 - 36.0 g/dL   RDW 11.9 11.5 - 15.5 %   Platelets 302 150 - 400 K/uL    Comment: Performed at [facility], [place]. [address], [place], [place] [address]  Basic metabolic panel     Status: Abnormal   Collection Time: 11/27/17  5:25 AM  Result Value Ref Range   Sodium 138 135 - 145 mmol/L   Potassium 3.7 3.5 - 5.1 mmol/L   Chloride 101 98 - 111 mmol/L    Comment: Please note change in reference range.   CO2 24 22 - 32 mmol/L   Glucose, Bld 140 (H) 70 - 99 mg/dL    Comment: Please note change in reference range.   BUN 43 (H) 8 - 23 mg/dL    Comment: Please note change in reference range.   Creatinine, Ser 3.54 (H) 0.61 - 1.24 mg/dL   Calcium 7.7 (L) 8.9 - 10.3 mg/dL   GFR calc non Af Amer 16 (L) >60 mL/min   GFR calc Af Amer 19 (L) >60 mL/min    Comment: (NOTE) The eGFR  has been calculated using the CKD EPI equation. This calculation has not been validated in all clinical situations. eGFR's persistently <60 mL/min signify possible Chronic Kidney Disease.    Anion gap 13 5 - 15    Comment: Performed at [facility] [address] [place], [place] [address]  Glucose, capillary     Status: Abnormal   Collection Time: 11/27/17  6:47 AM  Result Value Ref Range   Glucose-Capillary 107 (H) 70 - 99 mg/dL  Glucose, capillary     Status: Abnormal   Collection Time: 11/27/17 12:09 PM  Result Value Ref Range   Glucose-Capillary 137 (H) 70 - 99 mg/dL  Urinalysis, Routine w reflex microscopic     Status: Abnormal   Collection Time: 11/27/17  1:13 PM  Result Value Ref Range   Color, Urine AMBER (A) YELLOW    Comment: BIOCHEMICALS MAY BE AFFECTED BY COLOR   APPearance CLOUDY (A) CLEAR   Specific Gravity, Urine 1.021 1.005 - 1.030   pH 5.0 5.0 - 8.0   Glucose, UA NEGATIVE NEGATIVE mg/dL   Hgb urine dipstick MODERATE (A) NEGATIVE   Bilirubin Urine NEGATIVE NEGATIVE   Ketones, ur NEGATIVE NEGATIVE mg/dL   Protein, ur 100 (A) NEGATIVE mg/dL   Nitrite NEGATIVE NEGATIVE   Leukocytes, UA NEGATIVE NEGATIVE   RBC / HPF 21-50 0 - 5 RBC/hpf  WBC, UA 11-20 0 - 5 WBC/hpf   Bacteria, UA RARE (A) NONE SEEN   Squamous Epithelial / LPF 0-5 0 - 5   Mucus PRESENT     Comment: Performed at [facility], [place] [address], [place], [place] [address]  Protein / creatinine ratio, urine     Status: Abnormal   Collection Time: 11/27/17  1:13 PM  Result Value Ref Range   Creatinine, Urine 245.18 mg/dL   Total Protein, Urine 129 mg/dL    Comment: NO NORMAL RANGE ESTABLISHED FOR THIS TEST   Protein Creatinine Ratio 0.53 (H) 0.00 - 0.15 mg/mg[Cre]    Comment: Performed at [facility] [address], [place], [place] [address]  Sodium, urine, random     Status: None   Collection Time: 11/27/17  1:13 PM  Result Value Ref Range   Sodium, Ur 13 mmol/L    Comment:  Performed at [facility] [address] [place], [place] [address]  Glucose, capillary     Status: Abnormal   Collection Time: 11/27/17  4:45 PM  Result Value Ref Range   Glucose-Capillary 165 (H) 70 - 99 mg/dL  Glucose, capillary     Status: Abnormal   Collection Time: 11/27/17  8:08 PM  Result Value Ref Range   Glucose-Capillary 192 (H) 70 - 99 mg/dL  CBC     Status: Abnormal   Collection Time: 11/28/17  3:40 AM  Result Value Ref Range   WBC 17.4 (H) 4.0 - 10.5 K/uL   RBC 2.74 (L) 4.22 - 5.81 MIL/uL   Hemoglobin 8.9 (L) 13.0 - 17.0 g/dL   HCT 29.7 (L) 39.0 - 52.0 %   MCV 108.4 (H) 78.0 - 100.0 fL   MCH 32.5 26.0 - 34.0 pg   MCHC 30.0 30.0 - 36.0 g/dL   RDW 11.9 11.5 - 15.5 %   Platelets 316 150 - 400 K/uL    Comment: Performed at [facility], [place] [address], [place], [place] [address]  Basic metabolic panel     Status: Abnormal   Collection Time: 11/28/17  3:40 AM  Result Value Ref Range   Sodium 136 135 - 145 mmol/L   Potassium 3.7 3.5 - 5.1 mmol/L   Chloride 103 98 - 111 mmol/L    Comment: Please note change in reference range.   CO2 22 22 - 32 mmol/L   Glucose, Bld 126 (H) 70 - 99 mg/dL    Comment: Please note change in reference range.   BUN 50 (H) 8 - 23 mg/dL    Comment: Please note change in reference range.   Creatinine, Ser 4.61 (H) 0.61 - 1.24 mg/dL   Calcium 7.9 (L) 8.9 - 10.3 mg/dL   GFR calc non Af Amer 12 (L) >60 mL/min   GFR calc Af Amer 14 (L) >60 mL/min    Comment: (NOTE) The eGFR has been calculated using the CKD EPI equation. This calculation has not been validated in all clinical situations. eGFR's persistently <60 mL/min signify possible Chronic Kidney Disease.    Anion gap 11 5 - 15    Comment: Performed at [facility] [address], [place], [place] [address]  Glucose, capillary     Status: Abnormal   Collection Time: 11/28/17  4:40 AM  Result Value Ref Range   Glucose-Capillary 115 (H) 70 - 99 mg/dL   US  Renal  Result Date: 11/27/2017 CLINICAL DATA:  Acute kidney injury. EXAM: RENAL /  URINARY TRACT ULTRASOUND COMPLETE COMPARISON:  CT abdomen and pelvis 11/24/2017. FINDINGS: Right Kidney: Length: 12.3 cm. Echogenicity within normal limits. No mass or hydronephrosis visualized. Left Kidney: Length: 12.3 cm. Echogenicity within normal limits. No mass or hydronephrosis visualized. Bladder: Appears normal for degree of bladder distention. IMPRESSION: Negative for hydronephrosis.  Negative exam. Electronically Signed   By: Inge Rise M.D.   On: 11/27/2017 09:57    PMH:   Past Medical History:  Diagnosis Date  . Arthritis   . CAD in native artery    s/p stent in 11/18  . CHF (congestive heart failure) ([place])    normal echo in 11/18  . CKD (chronic kidney disease)   . DDD (degenerative disc disease), cervical   . Diabetes mellitus without complication ([place])    type II  . Diabetic neuropathy ([place])   . Diabetic retinopathy ([place])   . GERD (gastroesophageal reflux disease)   . Glaucoma   . Hypertension   . Renal disorder    Stage II    PSH:   Past Surgical History:  Procedure Laterality Date  . AMPUTATION Right 07/09/2016   Procedure: Right Great Toe Amputation at Metatarsophalangeal Joint;  Surgeon: [name], MD;  Location: [facility];  Service: Orthopedics;  Laterality: Right;  . BACK SURGERY     4  . CARDIAC CATHETERIZATION    . CORONARY STENT INTERVENTION N/A 04/06/2017   Procedure: CORONARY STENT INTERVENTION;  Surgeon: Nigel Mormon, MD;  Location: [facility];  Service: Cardiovascular;  Laterality: N/A;  . CORONARY STENT INTERVENTION N/A 04/07/2017   Procedure: CORONARY STENT INTERVENTION;  Surgeon: Nigel Mormon, MD;  Location: [facility];  Service: Cardiovascular;  Laterality: N/A;  . CYST EXCISION     on Back  . JOINT REPLACEMENT    . LAPAROSCOPIC ABDOMINAL EXPLORATION N/A 11/26/2017   Procedure: LAPAROSCOPIC ABDOMINAL EXPLORATION, DRAINAGE OF  APPENDICEAL ABCESS. PLACEMENT OF DRAIN;  Surgeon: Alphonsa Overall, MD;  Location: [facility];  Service: General;  Laterality: N/A;  . RIGHT/LEFT HEART CATH AND CORONARY ANGIOGRAPHY N/A 04/06/2017   Procedure: RIGHT/LEFT HEART CATH AND CORONARY ANGIOGRAPHY;  Surgeon: Nigel Mormon, MD;  Location: [facility];  Service: Cardiovascular;  Laterality: N/A;  . TOTAL HIP ARTHROPLASTY     Right     Allergies: No Known Allergies  Medications:   Prior to Admission medications   Medication Sig Start Date End Date Taking? Authorizing Provider  albuterol (PROVENTIL HFA;VENTOLIN HFA) 108 (90 Base) MCG/ACT inhaler Inhale 2 puffs into the lungs every 6 (six) hours as needed for wheezing or shortness of breath. 04/09/17  Yes Barton Dubois, MD  ALPRAZolam (XANAX) 1 MG tablet TAKE 1 TABLET BY MOUTH UP TO TWICE DAILY AS NEEDED FOR ANXEITY 06/17/16  Yes [provider]  amLODipine (NORVASC) 5 MG tablet Take 1 tablet (5 mg total) by mouth daily. 04/10/17  Yes Barton Dubois, MD  aspirin EC 81 MG tablet Take 81 mg by mouth daily.   Yes [provider]  atorvastatin (LIPITOR) 40 MG tablet Take 40 mg by mouth at bedtime. 08/01/16  Yes [provider]  clopidogrel (PLAVIX) 75 MG tablet Take 1 tablet (75 mg total) by mouth daily. 04/10/17  Yes Barton Dubois, MD  dorzolamide-timolol (COSOPT) 22.3-6.8 MG/ML ophthalmic solution Place 1 drop into both eyes 2 (two) times daily. 11/17/17  Yes [provider]  fluticasone (FLONASE) 50 MCG/ACT nasal spray Place 1 spray into both nostrils daily. 04/09/17  Yes  Barton Dubois, MD  gabapentin (NEURONTIN) 600 MG tablet Take 600 mg by mouth 2 (two) times daily.  01/31/15  Yes [provider]  HUMALOG KWIKPEN 100 UNIT/ML KiwkPen Inject 4 Units into the skin 3 (three) times daily. 08/18/17  Yes [provider]  HYDROcodone-acetaminophen (NORCO) 10-325 MG tablet Take 1 tablet by mouth 2 (two) times daily as needed for moderate pain.   03/19/16  Yes [provider]  Insulin Detemir (LEVEMIR FLEXTOUCH) 100 UNIT/ML Pen Inject 40 Units into the skin daily. Patient taking differently: Inject 42 Units into the skin daily.  04/09/17  Yes Barton Dubois, MD  isosorbide-hydrALAZINE (BIDIL) 20-37.5 MG tablet Take 1 tablet by mouth 3 (three) times daily. 09/08/16  Yes Ghimire, Henreitta Leber, MD  methocarbamol (ROBAXIN) 750 MG tablet Take 1 tablet (750 mg total) by mouth every 6 (six) hours as needed for muscle spasms. 09/08/16  Yes Ghimire, Henreitta Leber, MD  metoprolol succinate (TOPROL-XL) 100 MG 24 hr tablet Take 1 tablet (100 mg total) by mouth daily. Take with or immediately following a meal. 04/10/17  Yes Barton Dubois, MD  polyethylene glycol (MIRALAX / GLYCOLAX) packet Take 17 g by mouth daily as needed for mild constipation.   Yes [provider]  furosemide (LASIX) 20 MG tablet Take 1 tablet (20 mg total) by mouth daily. Take twice daily for 5 days, and then daily after that Patient not taking: Reported on 11/24/2017 04/09/17   Barton Dubois, MD  iron polysaccharides (NIFEREX) 150 MG capsule Take 1 capsule (150 mg total) by mouth daily. Patient not taking: Reported on 11/24/2017 04/09/17   Barton Dubois, MD    Discontinued Meds:   Medications Discontinued During This Encounter  Medication Reason  . docusate sodium (COLACE) 100 MG capsule Patient Preference  . LUMIGAN 0.01 % SOLN Change in therapy  . aspirin chewable tablet 81 mg   . aspirin suppository 150 mg   . insulin aspart (novoLOG) injection 0-5 Units   . insulin aspart (novoLOG) injection 0-15 Units   . 0.9 %  sodium chloride infusion   . morphine 4 MG/ML injection 2 mg   . gabapentin (NEURONTIN) capsule 600 mg   . amLODipine (NORVASC) tablet 5 mg   . bupivacaine-EPINEPHrine (MARCAINE W/ EPI) 0.25% -1:200000 (with pres) injection Patient Discharge  . sodium chloride irrigation 0.9 % Patient Discharge  . sodium chloride irrigation 0.9 % Patient Discharge   . 0.9 % irrigation (POUR BTL) Patient Discharge  . HYDROmorphone (DILAUDID) injection 0.25-0.5 mg Patient Transfer  . ondansetron (ZOFRAN) injection 4 mg Patient Transfer  . oxyCODONE (Oxy IR/ROXICODONE) immediate release tablet 5 mg Patient Transfer  . oxyCODONE (ROXICODONE) 5 MG/5ML solution 5 mg Patient Transfer  . 0.9 %  sodium chloride infusion   . insulin detemir (LEVEMIR) injection 42 Units   . dextrose 5 % and 0.45 % NaCl with KCl 20 mEq/L infusion   . lactated ringers infusion   . gabapentin (NEURONTIN) capsule 300 mg     Social History:  reports that he has never smoked. He has never used smokeless tobacco. He reports that he drank alcohol. He reports that he does not use drugs.  Family History:   Family History  Problem Relation Age of Onset  . Diabetes Other   . Hyperlipidemia Other   . Hypertension Other   . Stroke Other   . Alzheimer's disease Other   . Thyroid disease Mother   . Diabetes Mellitus II Father     Blood pressure 120/60,  pulse 70, temperature 98 F (36.7 C), temperature source Oral, resp. rate 16, height 6' 2.02" (1.88 m), weight 99.8 kg (220 lb), SpO2 97 %. General appearance: alert, cooperative and appears stated age Head: Normocephalic, without obvious abnormality, atraumatic Eyes: negative Neck: no adenopathy, no carotid bruit, supple, symmetrical, trachea midline and thyroid not enlarged, symmetric, no tenderness/mass/nodules Back: symmetric, no curvature. ROM normal. No CVA tenderness. Resp: clear to auscultation bilaterally Chest wall: no tenderness Cardio: regular rate and rhythm, S1, S2 normal, no murmur, click, rub or gallop GI: +BS NT Extremities: edema tr to 1+       Jacqui Headen, Hunt Oris, MD 11/28/2017, 9:18 AM

## 2017-11-28 NOTE — Evaluation (Addendum)
Occupational Therapy Evaluation Patient Details Name: David [name] MRN: [id number] DOB: 1945-10-22 Today's Date: 11/28/2017    History of Present Illness Pt is a 72 y/o male admitted secondary to worsening abdominal pain and constipation. Pt is s/p laparascopic abdominal exploration and drainage of appendiceal abscess with placement of drain on 7/11. PMH includes HTN, DM, CAD s/p stent placement, CHF, R THA, CKD, and back surgery per pt report.    Clinical Impression   PTA patient reports independent using quad cane.  Currently he is limited by decreased functional reach, abdominal pain, impaired balance, decreased activity tolerance, decreased sensation in B hands, and generalized weakness, requiring setup assist for UB ADL, mod A for LB bathing, and max A for LB dressing. He refused transfers as reports "I leak (from bowels) every time I move", reports extremely lethargic today due to many interruptions since 3am.  Educated on precautions, safety, importance of mobility and participation, compensatory techniques for increased independence with ADLs, and recommendations. Patient will benefit from continued OT services while admitted and after dc in order to maximize independence with ADLs and mobility prior to dc home with spouse intermittent support (as she works during the day).  Will continue to follow while admitted and update recommendations as patient's tolerance improves. 3     Follow Up Recommendations  SNF;Supervision/Assistance - 24 hour    Equipment Recommendations  Other (comment);3 in 1 bedside commode(TBD at next venue of care)    Recommendations for Other Services       Precautions / Restrictions Precautions Precautions: Fall Precaution Comments: JP drain  Restrictions Weight Bearing Restrictions: No      Mobility Bed Mobility               General bed mobility comments: seated in recliner upon entry  Transfers                 General transfer  comment: unable to assess, patient refused    Balance Overall balance assessment: Needs assistance Sitting-balance support: No upper extremity supported;Feet supported Sitting balance-Leahy Scale: Fair Sitting balance - Comments: limited functional reach and dyanmic balance due to abdominal pain                                    ADL either performed or assessed with clinical judgement   ADL Overall ADL's : Needs assistance/impaired Eating/Feeding: Set up;Sitting   Grooming: Set up;Sitting   Upper Body Bathing: Set up;Sitting   Lower Body Bathing: Moderate assistance;Sitting/lateral leans   Upper Body Dressing : Set up;Sitting   Lower Body Dressing: Maximal assistance;Sitting/lateral leans     Toilet Transfer Details (indicate cue type and reason): pt refused           General ADL Comments: limited evaluation, patient reports "leaking from bowels" with movement, therefore refuses to transfer; limited functional reach to B feet due to abdominal pain, generalized weakness     Vision Baseline Vision/History: Wears glasses Wears Glasses: Reading only Patient Visual Report: No change from baseline Vision Assessment?: No apparent visual deficits     Perception     Praxis      Pertinent Vitals/Pain Pain Assessment: Faces Faces Pain Scale: Hurts little more Pain Location: abdominal pain  Pain Descriptors / Indicators: Aching;Operative site guarding Pain Intervention(s): Limited activity within patient's tolerance     Hand Dominance Right   Extremity/Trunk Assessment Upper Extremity Assessment Upper Extremity Assessment: Generalized  weakness;RUE deficits/detail;LUE deficits/detail(neuropathy B hands, limited sensation) RUE Deficits / Details: AROM WFL, 3+/5  RUE Sensation: history of peripheral neuropathy RUE Coordination: WNL LUE Deficits / Details: AROM WFL, 3+/5  LUE Sensation: history of peripheral neuropathy LUE Coordination: WNL   Lower  Extremity Assessment Lower Extremity Assessment: Defer to PT evaluation       Communication Communication Communication: No difficulties   Cognition Arousal/Alertness: Lethargic(reports exhaustion ) Behavior During Therapy: Flat affect Overall Cognitive Status: No family/caregiver present to determine baseline cognitive functioning(reports "I am just tired". )                                 General Comments: limited eval, patient refusing any mobility therefore unable to assess safety or problem sovling with functional tasks    General Comments       Exercises     Shoulder Instructions      Home Living Family/patient expects to be discharged to:: Private residence Living Arrangements: Spouse/significant other Available Help at Discharge: Family;Available PRN/intermittently Type of Home: House Home Access: Stairs to enter [employer] of Steps: 3 Entrance Stairs-Rails: Right;Left;Can reach both Home Layout: Two level Alternate Level Stairs-Number of Steps: 12 Alternate Level Stairs-Rails: Right Bathroom Shower/Tub: Teacher, early years/pre: Standard     Home Equipment: Environmental consultant - 2 wheels;Cane - quad;Other (comment)(reacher, sock aide)   Additional Comments: patient reports he only completes stairs when his wife is present      Prior Functioning/Environment Level of Independence: Independent with assistive device(s)        Comments: reports using quad cane, but able to complete ADL and IADL without assist        OT Problem List: Decreased strength;Decreased activity tolerance;Impaired balance (sitting and/or standing);Decreased safety awareness;Pain      OT Treatment/Interventions: Self-care/ADL training;Therapeutic exercise;Energy conservation;DME and/or AE instruction;Therapeutic activities;Patient/family education;Balance training    OT Goals(Current goals can be found in the care plan section) Acute Rehab OT Goals Patient  Stated Goal: to feel better OT Goal Formulation: With patient Time For Goal Achievement: 12/12/17 Potential to Achieve Goals: Fair  OT Frequency: Min 2X/week   Barriers to D/C:            Co-evaluation              AM-PAC PT "6 Clicks" Daily Activity     Outcome Measure Help from another person eating meals?: A Little Help from another person taking care of personal grooming?: A Little Help from another person toileting, which includes using toliet, bedpan, or urinal?: A Lot Help from another person bathing (including washing, rinsing, drying)?: A Little Help from another person to put on and taking off regular upper body clothing?: A Little Help from another person to put on and taking off regular lower body clothing?: A Lot 6 Click Score: 16   End of Session    Activity Tolerance: Patient limited by lethargy Patient left: in chair;with call bell/phone within reach  OT Visit Diagnosis: Other abnormalities of gait and mobility (R26.89);Muscle weakness (generalized) (M62.81);Pain Pain - part of body: (stomach)                Time: 0254-2706 OT Time Calculation (min): 13 min Charges:  OT General Charges $OT Visit: 1 Visit OT Evaluation $OT Eval Low Complexity: 1 Low G-Codes:     Delight Stare, OTR/L  Pager [place] 11/28/2017, 2:32 PM

## 2017-11-28 NOTE — Progress Notes (Signed)
[facility] Progress Note  2 Days Post-Op  Subjective: CC:  Ambulating from bathroom to bed with rolling walker. Tolerating sips of clears from the floor - had two bowls of broth, Jell-O, and some other sips. Having small amts flatus and passing small volume stools that are described as mostly mucous. Denies belching, nausea, vomiting. Hungry and requesting to eat. Nephrologist at bedside as well.   Objective: Vital signs in last 24 hours: Temp:  [98 F (36.7 C)-98.2 F (36.8 C)] 98 F (36.7 C) (07/13 0447) Pulse Rate:  [61-70] 70 (07/13 0447) Resp:  [16] 16 (07/13 0447) BP: (116-120)/(59-60) 120/60 (07/13 0447) SpO2:  [97 %-100 %] 97 % (07/13 0447) Last BM Date: 11/17/17  Intake/Output from previous day: 07/12 0701 - 07/13 0700 In: 50 [P.O.:50] Out: 350 [Urine:225; Drains:125] Intake/Output this shift: No intake/output data recorded.  PE: Gen:  Alert, NAD, pleasant Card:  Regular rate and rhythm, pedal pulses 2+ BL Pulm:  Normal effort, clear to auscultation bilaterally Abd: Soft, mild distention, appropriately tender, +BS, small amt serous drainage from RUQ trochar site, RLQ drain with cloudy SS drainage  Skin: warm and dry, no rashes  Psych: A&Ox3   Lab Results:  Recent Labs    11/27/17 0525 11/28/17 0340  WBC 17.5* 17.4*  HGB 8.8* 8.9*  HCT 28.8* 29.7*  PLT 302 316   BMET Recent Labs    11/27/17 0525 11/28/17 0340  NA 138 136  K 3.7 3.7  CL 101 103  CO2 24 22  GLUCOSE 140* 126*  BUN 43* 50*  CREATININE 3.54* 4.61*  CALCIUM 7.7* 7.9*   PT/INR No results for input(s): LABPROT, INR in the last 72 hours. CMP     Component Value Date/Time   NA 136 11/28/2017 0340   K 3.7 11/28/2017 0340   CL 103 11/28/2017 0340   CO2 22 11/28/2017 0340   GLUCOSE 126 (H) 11/28/2017 0340   BUN 50 (H) 11/28/2017 0340   CREATININE 4.61 (H) 11/28/2017 0340   CALCIUM 7.9 (L) 11/28/2017 0340   PROT 6.9 11/24/2017 1015   ALBUMIN 2.7 (L) 11/24/2017 1015   AST 39 11/24/2017 1015   ALT 33 11/24/2017 1015   ALKPHOS 94 11/24/2017 1015   BILITOT 0.7 11/24/2017 1015   GFRNONAA 12 (L) 11/28/2017 0340   GFRAA 14 (L) 11/28/2017 0340   Lipase     Component Value Date/Time   LIPASE 24 11/24/2017 1015       Studies/Results: US Renal  Result Date: 11/27/2017 CLINICAL DATA:  Acute kidney injury. EXAM: RENAL / URINARY TRACT ULTRASOUND COMPLETE COMPARISON:  CT abdomen and pelvis 11/24/2017. FINDINGS: Right Kidney: Length: 12.3 cm. Echogenicity within normal limits. No mass or hydronephrosis visualized. Left Kidney: Length: 12.3 cm. Echogenicity within normal limits. No mass or hydronephrosis visualized. Bladder: Appears normal for degree of bladder distention. IMPRESSION: Negative for hydronephrosis.  Negative exam. Electronically Signed   By: Inge Rise M.D.   On: 11/27/2017 09:57    Anti-infectives: Anti-infectives (From admission, onward)   Start     Dose/Rate Route Frequency Ordered Stop   11/25/17 0300  piperacillin-tazobactam (ZOSYN) IVPB 3.375 g     3.375 g 12.5 mL/hr over 240 Minutes Intravenous Every 8 hours 11/24/17 1951     11/24/17 1930  piperacillin-tazobactam (ZOSYN) IVPB 3.375 g     3.375 g 100 mL/hr over 30 Minutes Intravenous  Once 11/24/17 1924 11/24/17 2229   11/24/17 1330  cefTRIAXone (ROCEPHIN) 2 g in sodium chloride 0.9 %  100 mL IVPB     2 g 200 mL/hr over 30 Minutes Intravenous  Once 11/24/17 1327 11/24/17 1456   11/24/17 1330  metroNIDAZOLE (FLAGYL) IVPB 500 mg     500 mg 100 mL/hr over 60 Minutes Intravenous  Once 11/24/17 1327 11/24/17 1658     Assessment/Plan HTN HLD CAD s/p PCI 2018 Acute on chronic anemia -stable hgb 8.9 from 8.8  DM2 uncontrolled  CKD with acute renal failure - nephrology consulted, Cr increased to over 4 today Chronic back pain  Acute appendicitis S/p LAPAROSCOPIC ABDOMINAL EXPLORATION, DRAINAGE OF APPENDICEAL ABCESS. PLACEMENT OF DRAIN 7/11 Dr. Lucia Gaskins - POD 2, afebrile, VSS, WBC  17.4 from 15.5 - drain with 125cc serosanguinous drainage - having some flatus and small BMs, tolerating sips of clears - advance to full liquids and monitor  - mobilize and up to chair, IS q 1h - never had a colonoscopy before  ID -zosyn 7/9>> FEN -IVF, full liquids  VTE -SCDs Foley -none       LOS: 4 days    Jill Alexanders , [facility] 11/28/2017, 10:55 AM Pager: [phone] Consults: [phone] Mon-Fri 7:00 am-4:30 pm Sat-Sun 7:00 am-11:30 am

## 2017-11-28 NOTE — NC FL2 (Signed)
[place] LEVEL OF CARE SCREENING TOOL     IDENTIFICATION  Patient Name: David [name] Birthdate: 04/07/1946 Sex: male Admission Date (Current Location): 11/24/2017  [facility] and [place] Number:  Herbalist and Address:  [facility] [place], [place] [address] [place], [place] [address]      Provider Number: [id number]  Attending Physician Name and Address:  Patrecia Pour, MD  Relative Name and Phone Number:  Ferd Hibbs; wife; [phone]    Current Level of Care: Hospital Recommended Level of Care: [place] Prior Approval Number:    Date Approved/Denied:   PASRR Number: [id number]  Discharge Plan:      Current Diagnoses: Patient Active Problem List   Diagnosis Date Noted  . Appendicitis 11/24/2017  . Acute on chronic congestive heart failure ([place])   . Status post coronary artery stent placement   . Acute renal failure superimposed on stage 3 chronic kidney disease ([place])   . Pneumonia 03/30/2017  . Insulin-requiring or dependent type II diabetes mellitus ([place]) 03/29/2017  . Acute hypoxemic respiratory failure ([place]) 03/29/2017  . Acute respiratory failure with hypoxia ([place])   . JVD (jugular venous distension)   . Pulmonary congestion   . Acquired contracture of Achilles tendon, right 08/19/2016  . Amputated great toe, right ([place]) 07/18/2016  . Onychomycosis 05/29/2016  . Diabetic polyneuropathy associated with type 2 diabetes mellitus ([place]) 04/09/2016  . CKD (chronic kidney disease), stage II 02/07/2015  . Unspecified hereditary and idiopathic peripheral neuropathy 09/15/2013  . Diabetes mellitus ([place]) 09/14/2013  . Malaise 08/14/2013  . Intractable nausea and vomiting 08/14/2013  . Acute gastroenteritis 08/14/2013  . Hypertension 08/14/2013  . Chronic back pain 08/14/2013  . Glaucoma 08/14/2013  . Headache 08/14/2013  . Hypertensive urgency 08/14/2013    Orientation RESPIRATION BLADDER Height &  Weight     Self, Time, Situation, Place  Normal Continent Weight: 220 lb (99.8 kg) Height:  6' 2.02" (188 cm)  BEHAVIORAL SYMPTOMS/MOOD NEUROLOGICAL BOWEL NUTRITION STATUS      Continent Diet(see discharge summary)  AMBULATORY STATUS COMMUNICATION OF NEEDS Skin   Extensive Assist Verbally Surgical wounds(incision on abdomen with liquid skin glue; incision on toe; jp drain)                       Personal Care Assistance Level of Assistance  Feeding, Bathing, Dressing Bathing Assistance: Maximum assistance Feeding assistance: Independent Dressing Assistance: Limited assistance     Functional Limitations Info  Sight, Hearing, Speech Sight Info: Adequate Hearing Info: Adequate Speech Info: Adequate    SPECIAL CARE FACTORS FREQUENCY  PT (By licensed PT), OT (By licensed OT)     PT Frequency: 5x week OT Frequency: 5x week            Contractures Contractures Info: Not present    Additional Factors Info  Code Status, Allergies, Insulin Sliding Scale Code Status Info: Full Code Allergies Info: No Known Allergies   Insulin Sliding Scale Info: insulin aspart (novoLOG) injection 0-9 Units 3x daily with meals; insulin detemir (LEVEMIR) injection 30 Units daily        Current Medications (11/28/2017):  This is the current hospital active medication list Current Facility-Administered Medications  Medication Dose Route Frequency Provider Last Rate Last Dose  . 0.9 %  sodium chloride infusion   Intravenous Continuous Alphonsa Overall, MD 10 mL/hr at 11/26/17 1509    . acetaminophen (TYLENOL) tablet 650 mg  650 mg Oral  Q6H PRN Alphonsa Overall, MD   650 mg at 11/28/17 1021   Or  . acetaminophen (TYLENOL) suppository 650 mg  650 mg Rectal Q6H PRN Alphonsa Overall, MD      . ALPRAZolam Duanne [place]) tablet 1 mg  1 mg Oral BID PRN Alphonsa Overall, MD   0.5 mg at 11/26/17 2237  . aspirin chewable tablet 81 mg  81 mg Oral Daily Alphonsa Overall, MD   81 mg at 11/28/17 1021  . atorvastatin (LIPITOR)  tablet 40 mg  40 mg Oral QHS Alphonsa Overall, MD   40 mg at 11/27/17 2302  . dextrose 5 %-0.9 % sodium chloride infusion   Intravenous Continuous Dwana Melena, MD 125 mL/hr at 11/27/17 1207    . dorzolamide-timolol (COSOPT) 22.3-6.8 MG/ML ophthalmic solution 1 drop  1 drop Both Eyes BID Alphonsa Overall, MD   1 drop at 11/28/17 1022  . fluticasone (FLONASE) 50 MCG/ACT nasal spray 1 spray  1 spray Each Nare Daily Alphonsa Overall, MD      . hydrALAZINE (APRESOLINE) injection 5 mg  5 mg Intravenous Q4H PRN Alphonsa Overall, MD   5 mg at 11/25/17 2356  . HYDROmorphone (DILAUDID) injection 0.5-1 mg  0.5-1 mg Intravenous Q4H PRN Alphonsa Overall, MD   1 mg at 11/28/17 0331  . insulin aspart (novoLOG) injection 0-9 Units  0-9 Units Subcutaneous TID WC Alphonsa Overall, MD   1 Units at 11/27/17 1332  . insulin detemir (LEVEMIR) injection 30 Units  30 Units Subcutaneous Daily Patrecia Pour, MD   30 Units at 11/28/17 1031  . isosorbide-hydrALAZINE (BIDIL) 20-37.5 MG per tablet 1 tablet  1 tablet Oral TID Alphonsa Overall, MD   1 tablet at 11/27/17 2302  . metoprolol succinate (TOPROL-XL) 24 hr tablet 100 mg  100 mg Oral Daily Alphonsa Overall, MD   100 mg at 11/28/17 1030  . ondansetron (ZOFRAN) tablet 4 mg  4 mg Oral Q6H PRN Alphonsa Overall, MD       Or  . ondansetron [facility]) injection 4 mg  4 mg Intravenous Q6H PRN Alphonsa Overall, MD      . piperacillin-tazobactam (ZOSYN) IVPB 3.375 g  3.375 g Intravenous Camelia Phenes Alphonsa Overall, MD 12.5 mL/hr at 11/28/17 0321 3.375 g at 11/28/17 0321     Discharge Medications: Please see discharge summary for a list of discharge medications.  Relevant Imaging Results:  Relevant Lab Results:   Additional Information SS# [place], [place]

## 2017-11-28 NOTE — Progress Notes (Signed)
OT Cancellation Note  Patient Details Name: David [name] MRN: [id number] DOB: 1946-01-30   Cancelled Treatment:    Reason Eval/Treat Not Completed: Patient declined, no reason specified(using restroom, reports needing more time). Will re attempt OT eval as schedule allows.   Delight Stare 11/28/2017, 12:19 PM

## 2017-11-28 NOTE — Progress Notes (Signed)
PROGRESS NOTE  David [name]  ZOX:[id number] DOB: 1946-01-13 DOA: 11/24/2017 PCP: [name], MD  Outpatient Specialists: Cardiology, Dr. [name] Brief Narrative: David [name] is a 72 y.o. male with a history of CAD s/p stenting Nov 2018 on DAPT, previous CHF with most recent echo normal, T2DM, stage II CKD, HTN who presented with periumbilical abdominal pain found to have appendicitis related to appendicolith on imaging. Also with AKI and hyperglycemia. Surgery consulted and cardiology saw patient to inform decisions regarding antiplatelet therapy. Plavix has been held. Sepsis worsened despite antibiotics and surgery is scheduled.    Assessment & Plan: Principal Problem:   Appendicitis Active Problems:   Hypertension   Diabetes mellitus ([place])   Status post coronary artery stent placement   Acute renal failure superimposed on stage 3 chronic kidney disease (HCC)  Sepsis due to acute appendiceal abscess: s/p drainage and drain placement 7/11 by Dr. Lucia Gaskins:  - Diet per surgery.  - Discussed with pt, needs to mobilize and take fluids - Continue zosyn  Acute renal failure on stage II CKD: Initial improvement with IV fluids, now worsening in light of worsening infection and postop. UOP has dropped off. Renal U/S negative.  - Given 3L in yesterday with 400cc UOP (none charted in PM shift). He is not edematous or showing signs of pulmonary edema. FENa low and continues to have little po intake. Will give 1L bolus, continue 125cc/hr IVF's  - I've asked for nephrology consultation. Appreciate their input.  - DC'ed gabapentin with lethargy and worsening renal function.  - Urinalysis showed pyuria: Denies dysuria, etc. Will check UCx and continue zosyn.  CAD s/p PCI by Dr. [name] 04/06/2017: Maintained on DAPT. Appears euvolemic.  - Cardiology, Dr. [name], evaluated patient. Continue ASA, ok to hold plavix for surgery.  - Defer to surgery, would like to restart plavix as soon as  reasonable.  - Continue statin   Acute blood loss anemia on anemia of chronic disease: Hgb down 10.1 > 8.8 postop with stable platelet count. - Hgb stable today, transfuse for hgb < 8 w/CAD.   HTN:  - Continue metoprolol, place hold parameters on bidil, and hold norvasc for now to minimize hypotension.   IDT2DM: HbA1c 7.5%. - Continue levemir and sensitive SSI  Left hip pain: Reported fall in bathroom a few days PTA with resultant left lateral thigh pain.  - Exam benign. Avoid NSAIDs. - PT/OT post op have been ordered  DVT prophylaxis: SCDs Code Status: Full Family Communication: None at bedside Disposition Plan: Remains uncertain.  Consultants:   Surgery  Cardiology  Procedures:   None  Antimicrobials:  Zosyn  Subjective: More alert today, states he's been urinating less than usual but having no hesitancy, urgency, dysuria. No change in abd pain. +Flatus. No fevers in past 24 hours.   Objective: Vitals:   11/26/17 2200 11/27/17 0412 11/27/17 1900 11/28/17 0447  BP: (!) 155/92 133/63 (!) 116/59 120/60  Pulse: 80 69 61 70  Resp: 16 17 16 16   Temp: 99.7 F (37.6 C) 98.9 F (37.2 C) 98.2 F (36.8 C) 98 F (36.7 C)  TempSrc: Oral Oral Oral Oral  SpO2: 99% 100% 100% 97%  Weight:      Height:        Intake/Output Summary (Last 24 hours) at 11/28/2017 0822 Last data filed at 11/27/2017 1800 Gross per 24 hour  Intake 50 ml  Output 350 ml  Net -300 ml   Filed Weights   11/24/17 1004 11/26/17 1507  Weight: 99.8 kg (220 lb) 99.8 kg (220 lb)   Gen: Tired, pleasant 72 y.o. male in no distress Pulm: Nonlabored breathing room air. Clear. CV: Regular rate and rhythm. No murmur, rub, or gallop. No JVD, no dependent edema. GI: Abdomen soft, bloated without distention, appropriately tender. +BS.   Ext: Warm, no deformities Skin: RLQ drain site with dressing with dried blood, none ongoing. Lap incision sites without significant erythema, dermabonded. Neuro: Alert and  oriented. No focal neurological deficits. Psych: Judgement and insight appear fair. Mood euthymic & affect congruent. Behavior is appropriate.    Data Reviewed: I have personally reviewed following labs and imaging studies  CBC: Recent Labs  Lab 11/24/17 1015 11/25/17 0419 11/26/17 0334 11/27/17 0525 11/28/17 0340  WBC 16.9* 18.0* 24.6* 17.5* 17.4*  NEUTROABS  --   --  21.2*  --   --   HGB 9.6* 9.6* 10.1* 8.8* 8.9*  HCT 30.3* 30.4* 31.7* 28.8* 29.7*  MCV 104.5* 104.5* 104.6* 107.1* 108.4*  PLT 278 300 301 302 132   Basic Metabolic Panel: Recent Labs  Lab 11/24/17 1015 11/25/17 0419 11/26/17 0334 11/27/17 0525 11/28/17 0340  NA 137 140 139 138 136  K 3.4* 3.3* 3.2* 3.7 3.7  CL 102 101 102 101 103  CO2 25 26 24 24 22   GLUCOSE 246* 115* 99 140* 126*  BUN 48* 42* 39* 43* 50*  CREATININE 2.53* 2.15* 2.62* 3.54* 4.61*  CALCIUM 8.5* 8.4* 8.2* 7.7* 7.9*   GFR: Estimated Creatinine Clearance: 18.8 mL/min (A) (by C-G formula based on SCr of 4.61 mg/dL (H)). Liver Function Tests: Recent Labs  Lab 11/24/17 1015  AST 39  ALT 33  ALKPHOS 94  BILITOT 0.7  PROT 6.9  ALBUMIN 2.7*   Recent Labs  Lab 11/24/17 1015  LIPASE 24   No results for input(s): AMMONIA in the last 168 hours. Coagulation Profile: No results for input(s): INR, PROTIME in the last 168 hours. Cardiac Enzymes: No results for input(s): CKTOTAL, CKMB, CKMBINDEX, TROPONINI in the last 168 hours. BNP (last 3 results) No results for input(s): PROBNP in the last 8760 hours. HbA1C: No results for input(s): HGBA1C in the last 72 hours. CBG: Recent Labs  Lab 11/27/17 0647 11/27/17 1209 11/27/17 1645 11/27/17 2008 11/28/17 0440  GLUCAP 107* 137* 165* 192* 115*   Lipid Profile: No results for input(s): CHOL, HDL, LDLCALC, TRIG, CHOLHDL, LDLDIRECT in the last 72 hours. Thyroid Function Tests: No results for input(s): TSH, T4TOTAL, FREET4, T3FREE, THYROIDAB in the last 72 hours. Anemia Panel: No  results for input(s): VITAMINB12, FOLATE, FERRITIN, TIBC, IRON, RETICCTPCT in the last 72 hours. Urine analysis:    Component Value Date/Time   COLORURINE AMBER (A) 11/27/2017 1313   APPEARANCEUR CLOUDY (A) 11/27/2017 1313   LABSPEC 1.021 11/27/2017 1313   PHURINE 5.0 11/27/2017 1313   GLUCOSEU NEGATIVE 11/27/2017 1313   HGBUR MODERATE (A) 11/27/2017 1313   BILIRUBINUR NEGATIVE 11/27/2017 1313   KETONESUR NEGATIVE 11/27/2017 1313   PROTEINUR 100 (A) 11/27/2017 1313   UROBILINOGEN 1.0 02/13/2015 1934   NITRITE NEGATIVE 11/27/2017 1313   LEUKOCYTESUR NEGATIVE 11/27/2017 1313   Recent Results (from the past 240 hour(s))  Surgical pcr screen     Status: None   Collection Time: 11/25/17  9:21 PM  Result Value Ref Range Status   MRSA, PCR NEGATIVE NEGATIVE Final   Staphylococcus aureus NEGATIVE NEGATIVE Final    Comment: (NOTE) The Xpert SA Assay (FDA approved for NASAL specimens in patients 22 years of  age and older), is one component of a comprehensive surveillance program. It is not intended to diagnose infection nor to guide or monitor treatment. Performed at [facility], [place] [address] [place], [place] [address]       Radiology Studies: US Renal  Result Date: 11/27/2017 CLINICAL DATA:  Acute kidney injury. EXAM: RENAL / URINARY TRACT ULTRASOUND COMPLETE COMPARISON:  CT abdomen and pelvis 11/24/2017. FINDINGS: Right Kidney: Length: 12.3 cm. Echogenicity within normal limits. No mass or hydronephrosis visualized. Left Kidney: Length: 12.3 cm. Echogenicity within normal limits. No mass or hydronephrosis visualized. Bladder: Appears normal for degree of bladder distention. IMPRESSION: Negative for hydronephrosis.  Negative exam. Electronically Signed   By: Inge Rise M.D.   On: 11/27/2017 09:57    Scheduled Meds: . aspirin  81 mg Oral Daily  . atorvastatin  40 mg Oral QHS  . dorzolamide-timolol  1 drop Both Eyes BID  . fluticasone  1 spray Each Nare Daily  .  insulin aspart  0-9 Units Subcutaneous TID WC  . insulin detemir  30 Units Subcutaneous Daily  . isosorbide-hydrALAZINE  1 tablet Oral TID  . metoprolol succinate  100 mg Oral Daily   Continuous Infusions: . sodium chloride 10 mL/hr at 11/26/17 1509  . dextrose 5 % and 0.9% NaCl 125 mL/hr at 11/27/17 1207  . lactated ringers    . piperacillin-tazobactam (ZOSYN)  IV 3.375 g (11/28/17 0321)     LOS: 4 days   Time spent: 35 minutes.  Patrecia Pour, MD [facility] [email or web address] Password [email or web address] 11/28/2017, 8:22 AM

## 2017-11-29 LAB — CBC
HCT: 24.9 % — ABNORMAL LOW (ref 39.0–52.0)
HEMOGLOBIN: 7.7 g/dL — AB (ref 13.0–17.0)
MCH: 33 pg (ref 26.0–34.0)
MCHC: 30.9 g/dL (ref 30.0–36.0)
MCV: 106.9 fL — AB (ref 78.0–100.0)
Platelets: 298 10*3/uL (ref 150–400)
RBC: 2.33 MIL/uL — ABNORMAL LOW (ref 4.22–5.81)
RDW: 11.9 % (ref 11.5–15.5)
WBC: 14 10*3/uL — ABNORMAL HIGH (ref 4.0–10.5)

## 2017-11-29 LAB — BASIC METABOLIC PANEL
ANION GAP: 10 (ref 5–15)
BUN: 52 mg/dL — ABNORMAL HIGH (ref 8–23)
CO2: 22 mmol/L (ref 22–32)
CREATININE: 5.91 mg/dL — AB (ref 0.61–1.24)
Calcium: 7.5 mg/dL — ABNORMAL LOW (ref 8.9–10.3)
Chloride: 105 mmol/L (ref 98–111)
GFR calc non Af Amer: 9 mL/min — ABNORMAL LOW (ref >60)
GFR, EST AFRICAN AMERICAN: 10 mL/min — AB (ref >60)
Glucose, Bld: 98 mg/dL (ref 70–99)
Potassium: 3.2 mmol/L — ABNORMAL LOW (ref 3.5–5.1)
Sodium: 137 mmol/L (ref 135–145)

## 2017-11-29 LAB — GLUCOSE, CAPILLARY
GLUCOSE-CAPILLARY: 117 mg/dL — AB (ref 70–99)
GLUCOSE-CAPILLARY: 103 mg/dL — AB (ref 70–99)
GLUCOSE-CAPILLARY: 147 mg/dL — AB (ref 70–99)
Glucose-Capillary: 109 mg/dL — ABNORMAL HIGH (ref 70–99)
Glucose-Capillary: 150 mg/dL — ABNORMAL HIGH (ref 70–99)
Glucose-Capillary: 63 mg/dL — ABNORMAL LOW (ref 70–99)

## 2017-11-29 LAB — URINE CULTURE: Culture: NO GROWTH

## 2017-11-29 LAB — PREPARE RBC (CROSSMATCH)

## 2017-11-29 MED ORDER — SODIUM CHLORIDE 0.9 % IV BOLUS: 1000.0000 mL | Freq: Once | INTRAVENOUS | Status: DC

## 2017-11-29 MED ORDER — SODIUM CHLORIDE 0.9 % IV BOLUS
500.0000 mL | Freq: Once | INTRAVENOUS | Status: AC
  Administered 2017-11-29: 500 mL via INTRAVENOUS

## 2017-11-29 MED ORDER — SODIUM CHLORIDE 0.9% IV SOLUTION
Freq: Once | INTRAVENOUS | Status: AC
  Administered 2017-11-29: 08:00:00 via INTRAVENOUS

## 2017-11-29 NOTE — Progress Notes (Signed)
[facility] Progress Note  3 Days Post-Op  Subjective: CC:  Abdominal soreness with movements. States he doesn't like hospital food. Having flatus and small amt "clear" liquid stools. Dislikes hospital food. Endorses belching and bloating. Denies N/V. Sitting up and trying to increase his activity.   Objective: Vital signs in last 24 hours: Temp:  [98.2 F (36.8 C)-99.5 F (37.5 C)] 99.1 F (37.3 C) (07/14 0650) Pulse Rate:  [72-80] 72 (07/14 0350) Resp:  [16-18] 16 (07/14 0350) BP: (130-144)/(64-70) 144/70 (07/14 0926) SpO2:  [98 %-100 %] 100 % (07/14 0350) Last BM Date: 11/28/17  Intake/Output from previous day: 07/13 0701 - 07/14 0700 In: 4380.7 [P.O.:360; I.V.:2893.5; IV Piggyback:1127.2] Out: 275 [Urine:225; Drains:50] Intake/Output this shift: No intake/output data recorded.  PE: Gen:  Alert, NAD, pleasant Card:  Regular rate and rhythm, pedal pulses 2+ BL Pulm:  Normal effort, clear to auscultation bilaterally Abd: Soft, mild-mod distention, appropriately tender, +BS, RLQ drain with cloudy SS drainage, hypoactive BS  Skin: warm and dry, no rashes  Psych: A&Ox3   Lab Results:  Recent Labs    11/28/17 0340 11/29/17 0711  WBC 17.4* 14.0*  HGB 8.9* 7.7*  HCT 29.7* 24.9*  PLT 316 298   BMET Recent Labs    11/28/17 0340 11/29/17 0711  NA 136 137  K 3.7 3.2*  CL 103 105  CO2 22 22  GLUCOSE 126* 98  BUN 50* 52*  CREATININE 4.61* 5.91*  CALCIUM 7.9* 7.5*   PT/INR No results for input(s): LABPROT, INR in the last 72 hours. CMP     Component Value Date/Time   NA 137 11/29/2017 0711   K 3.2 (L) 11/29/2017 0711   CL 105 11/29/2017 0711   CO2 22 11/29/2017 0711   GLUCOSE 98 11/29/2017 0711   BUN 52 (H) 11/29/2017 0711   CREATININE 5.91 (H) 11/29/2017 0711   CALCIUM 7.5 (L) 11/29/2017 0711   PROT 6.9 11/24/2017 1015   ALBUMIN 2.7 (L) 11/24/2017 1015   AST 39 11/24/2017 1015   ALT 33 11/24/2017 1015   ALKPHOS 94 11/24/2017 1015   BILITOT 0.7 11/24/2017 1015   GFRNONAA 9 (L) 11/29/2017 0711   GFRAA 10 (L) 11/29/2017 0711   Lipase     Component Value Date/Time   LIPASE 24 11/24/2017 1015       Studies/Results: No results found.  Anti-infectives: Anti-infectives (From admission, onward)   Start     Dose/Rate Route Frequency Ordered Stop   11/28/17 1900  piperacillin-tazobactam (ZOSYN) IVPB 2.25 g     2.25 g 100 mL/hr over 30 Minutes Intravenous Every 8 hours 11/28/17 1602     11/25/17 0300  piperacillin-tazobactam (ZOSYN) IVPB 3.375 g  Status:  Discontinued     3.375 g 12.5 mL/hr over 240 Minutes Intravenous Every 8 hours 11/24/17 1951 11/28/17 1602   11/24/17 1930  piperacillin-tazobactam (ZOSYN) IVPB 3.375 g     3.375 g 100 mL/hr over 30 Minutes Intravenous  Once 11/24/17 1924 11/24/17 2229   11/24/17 1330  cefTRIAXone (ROCEPHIN) 2 g in sodium chloride 0.9 % 100 mL IVPB     2 g 200 mL/hr over 30 Minutes Intravenous  Once 11/24/17 1327 11/24/17 1456   11/24/17 1330  metroNIDAZOLE (FLAGYL) IVPB 500 mg     500 mg 100 mL/hr over 60 Minutes Intravenous  Once 11/24/17 1327 11/24/17 1658     Assessment/Plan HTN HLD CAD s/p PCI 2018 Acute on chronic anemia -hgb decreased to 7.7, follow. No overt GI bleeding.  DM2 uncontrolled  CKD with acute renal failure - nephrology consulted, suspect ATN, Cr increased to over 5 today Chronic back pain  Acute appendicitis S/pLAPAROSCOPIC ABDOMINAL EXPLORATION, DRAINAGE OF APPENDICEAL ABCESS. PLACEMENT OF DRAIN7/11 Dr. Lucia Gaskins - POD 3, afebrile, VSS, WBC 14 from 17 -drain with 50cc serosanguinous drainage, continue JP drain - having some flatus and liquid BMs, also w/ distention and belching. Anticipate ileus. - continue full liquids and monitor, do not advance diet today - mobilize and up to chair, IS q 1h - never had a colonoscopy before  ID -zosyn 7/9>> FEN -IVF,full liquids  VTE -SCDs Foley -none      LOS: 5 days    Jill Alexanders ,  [facility] 11/29/2017, 10:23 AM Pager: [phone] Consults: [phone] Mon-Fri 7:00 am-4:30 pm Sat-Sun 7:00 am-11:30 am

## 2017-11-29 NOTE — Plan of Care (Signed)
  Problem: Education: Goal: Knowledge of General Education information will improve Outcome: Progressing   Problem: Health Behavior/Discharge Planning: Goal: Ability to manage health-related needs will improve Outcome: Progressing   Problem: Clinical Measurements: Goal: Will remain free from infection Outcome: Progressing Goal: Diagnostic test results will improve Outcome: Progressing Goal: Respiratory complications will improve Outcome: Progressing Goal: Cardiovascular complication will be avoided Outcome: Progressing   Problem: Activity: Goal: Risk for activity intolerance will decrease Outcome: Progressing   Problem: Nutrition: Goal: Adequate nutrition will be maintained Outcome: Progressing   Problem: Elimination: Goal: Will not experience complications related to bowel motility Outcome: Progressing Goal: Will not experience complications related to urinary retention Outcome: Progressing   Problem: Safety: Goal: Ability to remain free from injury will improve Outcome: Progressing

## 2017-11-29 NOTE — Progress Notes (Signed)
PROGRESS NOTE  David [name]  NWG:[id number] DOB: 27-Feb-1946 DOA: 11/24/2017 PCP: [name], MD  Outpatient Specialists: Cardiology, Dr. [name] Brief Narrative: David [name] is a 72 y.o. male with a history of CAD s/p stenting Nov 2018 on DAPT, previous CHF with most recent echo normal, T2DM, stage II CKD, HTN who presented with periumbilical abdominal pain found to have appendicitis related to appendicolith on imaging. Also with AKI and hyperglycemia. Surgery consulted and cardiology saw patient to inform decisions regarding antiplatelet therapy. Plavix has been held. Sepsis worsened despite antibiotics and surgery performed drainage of appendiceal abscess with drain placement 7/11. There was some perioperative hypotension and evidence of acute renal failure with ATN for which nephrology was consulted.   Assessment & Plan: Principal Problem:   Appendicitis Active Problems:   Hypertension   Diabetes mellitus ([place])   Status post coronary artery stent placement   Acute renal failure superimposed on stage 3 chronic kidney disease (HCC)  Sepsis due to acute appendiceal abscess: s/p drainage and drain placement 7/11 by Dr. Lucia Gaskins:  - Diet per surgery.  - Discussed with pt, needs to mobilize and take fluids - Continue zosyn, consider switching to alternative abx if renal failure continues to worsen. Defer to nephrology.  - Leukocytosis improving, Tmax 99.26F / 24 hrs.    Acute renal failure on stage II CKD: Microscopy consistent with florid ATN. Renal U/S negative.  - UOP continues to be poor, though do suspect increased insensible losses, continue IV rehydration. Should also get some volume from RBCs. Will give additional 1L and continue infusion. There continues to be no edema, crackles, JVD, dyspnea, or orthopnea. - Avoid hypotension, nephrotoxins. - Renally dosing medications - Continue monitoring UOP, Cr. K is mildly low, will check again in AM with Mg. Reluctant to supplement  with renal failure.   CAD s/p PCI by Dr. [name] 04/06/2017: Maintained on DAPT. Appears euvolemic.  - Cardiology, Dr. [name], evaluated patient. Continue ASA, ok to hold plavix for surgery.  - Defer to surgery, would like to restart plavix as soon as reasonable.  - Continue statin   Acute blood loss anemia on anemia of chronic disease: Hgb down 10.1 > 8.8 > 7.7 postop with stable platelet count. - Will give 1u PRBCs 7/14 for hgb < 8 w/CAD. Discussed with patient.  - Recheck CBC in AM  HTN:  - Continue metoprolol, place hold parameters on bidil (don't want to precipitate angina by holding) - Holding norvasc  IDT2DM: HbA1c 7.5%. - Continue levemir and sensitive SSI. At inpatient goal without hypoglycemia.   Left hip pain: Reported fall in bathroom a few days PTA with resultant left lateral thigh pain. Resolved.   DVT prophylaxis: SCDs Code Status: Full Family Communication: None at bedside Disposition Plan: SNF when stabilized.   Consultants:   Surgery  Cardiology  Nephrology  Procedures:  11/26/17 LAPAROSCOPIC ABDOMINAL EXPLORATION, DRAINAGE OF APPENDICEAL ABCESS. PLACEMENT OF Bosie Clos, Shanon Brow, MD   Antimicrobials:  Zosyn  Subjective: States abdominal pain is improving, having flatus and clear BMs. No subjective fever. Not urinating much but denies dysuria, urgency. Denies dyspnea or orthopnea or leg swelling.  Objective: Vitals:   11/29/17 0926 11/29/17 1150 11/29/17 1205 11/29/17 1415  BP: (!) 144/70 125/67 134/69 139/75  Pulse:  84 65 67  Resp:  18 18 18   Temp:  98.2 F (36.8 C) 98.2 F (36.8 C) 98.1 F (36.7 C)  TempSrc:  Oral Oral Oral  SpO2:  98% 98% 98%  Weight:  Height:        Intake/Output Summary (Last 24 hours) at 11/29/2017 1447 Last data filed at 11/29/2017 1400 Gross per 24 hour  Intake 4846.11 ml  Output 100 ml  Net 4746.11 ml   Filed Weights   11/24/17 1004 11/26/17 1507  Weight: 99.8 kg (220 lb) 99.8 kg (220 lb)  Gen: 72 y.o.  male in no distress Pulm: Nonlabored breathing room air. Clear without crackles. CV: Regular rate and rhythm. No murmur, rub, or gallop. No JVD, no dependent edema. GI: Abdomen mildly distended but soft, appropriate tenderness especially in RLQ without rebound. +bowel sounds.  Ext: Warm, no deformities Skin: Incision sites c/d/i without exudate. Neuro: Alert and oriented. No focal neurological deficits. Psych: Judgement and insight appear fair. Mood euthymic & affect congruent.   Data Reviewed: I have personally reviewed following labs and imaging studies  CBC: Recent Labs  Lab 11/25/17 0419 11/26/17 0334 11/27/17 0525 11/28/17 0340 11/29/17 0711  WBC 18.0* 24.6* 17.5* 17.4* 14.0*  NEUTROABS  --  21.2*  --   --   --   HGB 9.6* 10.1* 8.8* 8.9* 7.7*  HCT 30.4* 31.7* 28.8* 29.7* 24.9*  MCV 104.5* 104.6* 107.1* 108.4* 106.9*  PLT 300 301 302 316 130   Basic Metabolic Panel: Recent Labs  Lab 11/25/17 0419 11/26/17 0334 11/27/17 0525 11/28/17 0340 11/29/17 0711  NA 140 139 138 136 137  K 3.3* 3.2* 3.7 3.7 3.2*  CL 101 102 101 103 105  CO2 26 24 24 22 22   GLUCOSE 115* 99 140* 126* 98  BUN 42* 39* 43* 50* 52*  CREATININE 2.15* 2.62* 3.54* 4.61* 5.91*  CALCIUM 8.4* 8.2* 7.7* 7.9* 7.5*   GFR: Estimated Creatinine Clearance: 14.7 mL/min (A) (by C-G formula based on SCr of 5.91 mg/dL (H)). Liver Function Tests: Recent Labs  Lab 11/24/17 1015  AST 39  ALT 33  ALKPHOS 94  BILITOT 0.7  PROT 6.9  ALBUMIN 2.7*   Recent Labs  Lab 11/24/17 1015  LIPASE 24   No results for input(s): AMMONIA in the last 168 hours. Coagulation Profile: No results for input(s): INR, PROTIME in the last 168 hours. Cardiac Enzymes: No results for input(s): CKTOTAL, CKMB, CKMBINDEX, TROPONINI in the last 168 hours. BNP (last 3 results) No results for input(s): PROBNP in the last 8760 hours. HbA1C: No results for input(s): HGBA1C in the last 72 hours. CBG: Recent Labs  Lab 11/28/17 2036  11/29/17 0009 11/29/17 0347 11/29/17 0636 11/29/17 1225  GLUCAP 161* 150* 117* 103* 109*   Lipid Profile: No results for input(s): CHOL, HDL, LDLCALC, TRIG, CHOLHDL, LDLDIRECT in the last 72 hours. Thyroid Function Tests: No results for input(s): TSH, T4TOTAL, FREET4, T3FREE, THYROIDAB in the last 72 hours. Anemia Panel: No results for input(s): VITAMINB12, FOLATE, FERRITIN, TIBC, IRON, RETICCTPCT in the last 72 hours. Urine analysis:    Component Value Date/Time   COLORURINE AMBER (A) 11/27/2017 1313   APPEARANCEUR CLOUDY (A) 11/27/2017 1313   LABSPEC 1.021 11/27/2017 1313   PHURINE 5.0 11/27/2017 1313   GLUCOSEU NEGATIVE 11/27/2017 1313   HGBUR MODERATE (A) 11/27/2017 1313   BILIRUBINUR NEGATIVE 11/27/2017 1313   KETONESUR NEGATIVE 11/27/2017 1313   PROTEINUR 100 (A) 11/27/2017 1313   UROBILINOGEN 1.0 02/13/2015 1934   NITRITE NEGATIVE 11/27/2017 1313   LEUKOCYTESUR NEGATIVE 11/27/2017 1313   Recent Results (from the past 240 hour(s))  Surgical pcr screen     Status: None   Collection Time: 11/25/17  9:21 PM  Result  Value Ref Range Status   MRSA, PCR NEGATIVE NEGATIVE Final   Staphylococcus aureus NEGATIVE NEGATIVE Final    Comment: (NOTE) The Xpert SA Assay (FDA approved for NASAL specimens in patients 40 years of age and older), is one component of a comprehensive surveillance program. It is not intended to diagnose infection nor to guide or monitor treatment. Performed at [facility], [place] [address] [place], [place] [address]   Culture, Urine     Status: None   Collection Time: 11/28/17 10:30 AM  Result Value Ref Range Status   Specimen Description URINE, CLEAN CATCH  Final   Special Requests NONE  Final   Culture   Final    NO GROWTH Performed at [facility], [place] [address], [place], [place] [address]    Report Status 11/29/2017 FINAL  Final      Radiology Studies: No results found.  Scheduled Meds: . sodium chloride   Intravenous Once    . aspirin  81 mg Oral Daily  . atorvastatin  40 mg Oral QHS  . dorzolamide-timolol  1 drop Both Eyes BID  . fluticasone  1 spray Each Nare Daily  . insulin aspart  0-9 Units Subcutaneous TID WC  . insulin detemir  30 Units Subcutaneous Daily  . isosorbide-hydrALAZINE  1 tablet Oral TID  . metoprolol succinate  100 mg Oral Daily   Continuous Infusions: . sodium chloride 10 mL/hr at 11/26/17 1509  . dextrose 5 % and 0.9% NaCl 125 mL/hr at 11/28/17 1309  . piperacillin-tazobactam (ZOSYN)  IV 2.25 g (11/29/17 0921)     LOS: 5 days   Time spent: 35 minutes.  Patrecia Pour, MD [facility] [email or web address] Password [facility] 11/29/2017, 2:47 PM

## 2017-11-29 NOTE — Plan of Care (Signed)
  Problem: Education: Goal: Knowledge of General Education information will improve Outcome: Progressing   Problem: Clinical Measurements: Goal: Ability to maintain clinical measurements within normal limits will improve Outcome: Progressing Goal: Will remain free from infection Outcome: Progressing Goal: Diagnostic test results will improve Outcome: Progressing Goal: Respiratory complications will improve Outcome: Progressing Goal: Cardiovascular complication will be avoided Outcome: Progressing   Problem: Activity: Goal: Risk for activity intolerance will decrease Outcome: Progressing

## 2017-11-29 NOTE — Progress Notes (Signed)
[facility] Progress Note    Assessment/ Plan:    72 y.o. male CKD IIIA, HTN, DM, CASHD with stenting 03/2017, CHF p/w abdominal pain, constipation alternating w/ CT [place] showed possible appendicitis and eventually the pt had a  exlap with drainage of appendiceal abscess + drain on 7/11 by Dr. Lucia Gaskins. Initially treated with Rocephin and Flagyl before transitioning to Zosyn. His cr was in the 1.5-1.6 range in 03/2017 but during the hospitalization it fluctuated before upward trend and is in ATN.  1. Acute kidney injury on CKD III Cr 1.5-1.6 03/2017 with possible progression but certainly with an acute component of kidney injury likely ATN with the sepsis w/ the appendiceal abscess  + hypotensive episodes.   - Light microscopy consistent with florid  ATN.  - Proteinuria from the diabetes which the pt has had for 20 years with neuropathy as well. - No acute indication for dialysis; with the poor UOP, clear lungs and no e/o dyspnea or orthopnea will be more aggressive with fluids today.  - He should be able to tolerate at least 2 liters and if no improvement then would back off to maintenance.  - Renal dose medications for a GFR under 30. - Pt not on any nephrotoxic agents. - ultrasound negative for obstructive etiology.  2. HTN - ok to continue but agree with holding the norvasc to increase perfusion. 3. DM 4. Sepsis s/p drainage of appendiceal abscess on Zosyn - fever curve trending down after surgery. 5. Anemia      Subjective:   Actually denies abd pain. Also denies dyspnea and is lying on his side at no angled in the bed. Denies f/c/n/v.    Objective:   BP (!) 144/70   Pulse 72   Temp 99.1 F (37.3 C) (Oral)   Resp 16   Ht 6' 2.02" (1.88 m)   Wt 99.8 kg (220 lb)   SpO2 100%   BMI 28.23 kg/m   Intake/Output Summary (Last 24 hours) at 11/29/2017 1026 Last data filed at 11/29/2017 0504 Gross per 24 hour  Intake 4380.69 ml  Output 125 ml  Net 4255.69 ml    Weight change:   Physical Exam: General appearance: NCAT Resp: clear to auscultation bilaterally; no rales or rhonchi Cardio: regular rate and rhythm, S1, S2 normal, no murmur, click, rub or gallop GI: +BS NT Extremities: edema tr to 1+    Imaging: No results found.  Labs: BMET Recent Labs  Lab 11/24/17 1015 11/25/17 0419 11/26/17 0334 11/27/17 0525 11/28/17 0340 11/29/17 0711  NA 137 140 139 138 136 137  K 3.4* 3.3* 3.2* 3.7 3.7 3.2*  CL 102 101 102 101 103 105  CO2 25 26 24 24 22 22   GLUCOSE 246* 115* 99 140* 126* 98  BUN 48* 42* 39* 43* 50* 52*  CREATININE 2.53* 2.15* 2.62* 3.54* 4.61* 5.91*  CALCIUM 8.5* 8.4* 8.2* 7.7* 7.9* 7.5*   CBC Recent Labs  Lab 11/26/17 0334 11/27/17 0525 11/28/17 0340 11/29/17 0711  WBC 24.6* 17.5* 17.4* 14.0*  NEUTROABS 21.2*  --   --   --   HGB 10.1* 8.8* 8.9* 7.7*  HCT 31.7* 28.8* 29.7* 24.9*  MCV 104.6* 107.1* 108.4* 106.9*  PLT 301 302 316 298    Medications:    . sodium chloride   Intravenous Once  . aspirin  81 mg Oral Daily  . atorvastatin  40 mg Oral QHS  . dorzolamide-timolol  1 drop Both Eyes BID  . fluticasone  1 spray Each Nare Daily  . insulin aspart  0-9 Units Subcutaneous TID WC  . insulin detemir  30 Units Subcutaneous Daily  . isosorbide-hydrALAZINE  1 tablet Oral TID  . metoprolol succinate  100 mg Oral Daily      Otelia Santee, MD 11/29/2017, 10:26 AM

## 2017-11-29 NOTE — Progress Notes (Signed)
Patient refusing to sign consent for ordered blood transfusion.  This RN explained to patient the need for the transfusion in regards to his hemoglobin level being low.  He stated this was "outrageous" and demanded to speak to the doctor.  Dr. Bonner Puna aware and will speak to patient.

## 2017-11-30 LAB — TYPE AND SCREEN
ABO/RH(D): O POS
ANTIBODY SCREEN: NEGATIVE
Unit division: 0

## 2017-11-30 LAB — GLUCOSE, CAPILLARY
GLUCOSE-CAPILLARY: 139 mg/dL — AB (ref 70–99)
Glucose-Capillary: 126 mg/dL — ABNORMAL HIGH (ref 70–99)
Glucose-Capillary: 134 mg/dL — ABNORMAL HIGH (ref 70–99)
Glucose-Capillary: 105 mg/dL — ABNORMAL HIGH (ref 70–99)

## 2017-11-30 LAB — BPAM RBC
Blood Product Expiration Date: 201908102359
ISSUE DATE / TIME: 201907141148
Unit Type and Rh: 5100

## 2017-11-30 LAB — CBC
HEMATOCRIT: 28.1 % — AB (ref 39.0–52.0)
HEMOGLOBIN: 8.7 g/dL — AB (ref 13.0–17.0)
MCH: 32.2 pg (ref 26.0–34.0)
MCHC: 31 g/dL (ref 30.0–36.0)
MCV: 104.1 fL — AB (ref 78.0–100.0)
Platelets: 313 10*3/uL (ref 150–400)
RBC: 2.7 MIL/uL — ABNORMAL LOW (ref 4.22–5.81)
RDW: 12.8 % (ref 11.5–15.5)
WBC: 13.5 10*3/uL — ABNORMAL HIGH (ref 4.0–10.5)

## 2017-11-30 LAB — BASIC METABOLIC PANEL
ANION GAP: 10 (ref 5–15)
BUN: 49 mg/dL — ABNORMAL HIGH (ref 8–23)
CHLORIDE: 108 mmol/L (ref 98–111)
CO2: 23 mmol/L (ref 22–32)
Calcium: 7.5 mg/dL — ABNORMAL LOW (ref 8.9–10.3)
Creatinine, Ser: 5.38 mg/dL — ABNORMAL HIGH (ref 0.61–1.24)
GFR calc non Af Amer: 10 mL/min — ABNORMAL LOW (ref >60)
GFR, EST AFRICAN AMERICAN: 11 mL/min — AB (ref >60)
Glucose, Bld: 154 mg/dL — ABNORMAL HIGH (ref 70–99)
POTASSIUM: 3.2 mmol/L — AB (ref 3.5–5.1)
Sodium: 141 mmol/L (ref 135–145)

## 2017-11-30 LAB — MAGNESIUM: Magnesium: 2.2 mg/dL (ref 1.7–2.4)

## 2017-11-30 NOTE — Progress Notes (Signed)
PT Cancellation Note  Patient Details Name: David [name] MRN: [id number] DOB: [birth year]   Cancelled Treatment:    Reason Eval/Treat Not Completed: (P) Patient declined, no reason specified(Pt reports he does not want to get OOB to walk.  He reports he doesn't give a flip about what his doctor is suggesting and referred to his doctor as "That Quack!".  Will f/u per POC pending patient participation.  )   [name] 11/30/2017, 11:50 AM  [name], PTA pager [phone]

## 2017-11-30 NOTE — Plan of Care (Signed)
  Problem: Activity: Goal: Risk for activity intolerance will decrease Outcome: Progressing   Problem: Nutrition: Goal: Adequate nutrition will be maintained Outcome: Progressing   

## 2017-11-30 NOTE — Progress Notes (Signed)
Patient ID: David [name], male   DOB: 09-Aug-1945, 72 y.o.   MRN: [id number]    4 Days Post-Op  Subjective: Pt seems irritated to be bothered and to have questioned asked to him.  Wants more to eat than fulls.  Passing flatus and passing mucus.  Denies abdominal pain, but then moans and states he has pain in his RLQ that's been present the entire admission.  Cold, wants the heat turned up.  Walking only in the room.  Says it's too cold in the hallway to walk there.  Objective: Vital signs in last 24 hours: Temp:  [98.1 F (36.7 C)-99.1 F (37.3 C)] 99.1 F (37.3 C) (07/15 0415) Pulse Rate:  [65-84] 79 (07/15 0415) Resp:  [16-18] 16 (07/15 0415) BP: (125-160)/(67-75) 158/74 (07/15 0415) SpO2:  [98 %-100 %] 99 % (07/15 0415) Last BM Date: 11/28/17  Intake/Output from previous day: 07/14 0701 - 07/15 0700 In: 465.4 [I.V.:100; Blood:365.4] Out: 11 [Urine:1; Drains:10] Intake/Output this shift: No intake/output data recorded.  PE: Abd: soft, obese, +BS, NT to palpation, incisions c/d/i, JP drain with serous output. 10cc documented yesterday.  Lab Results:  Recent Labs    11/29/17 0711 11/30/17 0319  WBC 14.0* 13.5*  HGB 7.7* 8.7*  HCT 24.9* 28.1*  PLT 298 313   BMET Recent Labs    11/29/17 0711 11/30/17 0319  NA 137 141  K 3.2* 3.2*  CL 105 108  CO2 22 23  GLUCOSE 98 154*  BUN 52* 49*  CREATININE 5.91* 5.38*  CALCIUM 7.5* 7.5*   PT/INR No results for input(s): LABPROT, INR in the last 72 hours. CMP     Component Value Date/Time   NA 141 11/30/2017 0319   K 3.2 (L) 11/30/2017 0319   CL 108 11/30/2017 0319   CO2 23 11/30/2017 0319   GLUCOSE 154 (H) 11/30/2017 0319   BUN 49 (H) 11/30/2017 0319   CREATININE 5.38 (H) 11/30/2017 0319   CALCIUM 7.5 (L) 11/30/2017 0319   PROT 6.9 11/24/2017 1015   ALBUMIN 2.7 (L) 11/24/2017 1015   AST 39 11/24/2017 1015   ALT 33 11/24/2017 1015   ALKPHOS 94 11/24/2017 1015   BILITOT 0.7 11/24/2017 1015   GFRNONAA 10 (L)  11/30/2017 0319   GFRAA 11 (L) 11/30/2017 0319   Lipase     Component Value Date/Time   LIPASE 24 11/24/2017 1015       Studies/Results: No results found.  Anti-infectives: Anti-infectives (From admission, onward)   Start     Dose/Rate Route Frequency Ordered Stop   11/28/17 1900  piperacillin-tazobactam (ZOSYN) IVPB 2.25 g     2.25 g 100 mL/hr over 30 Minutes Intravenous Every 8 hours 11/28/17 1602     11/25/17 0300  piperacillin-tazobactam (ZOSYN) IVPB 3.375 g  Status:  Discontinued     3.375 g 12.5 mL/hr over 240 Minutes Intravenous Every 8 hours 11/24/17 1951 11/28/17 1602   11/24/17 1930  piperacillin-tazobactam (ZOSYN) IVPB 3.375 g     3.375 g 100 mL/hr over 30 Minutes Intravenous  Once 11/24/17 1924 11/24/17 2229   11/24/17 1330  cefTRIAXone (ROCEPHIN) 2 g in sodium chloride 0.9 % 100 mL IVPB     2 g 200 mL/hr over 30 Minutes Intravenous  Once 11/24/17 1327 11/24/17 1456   11/24/17 1330  metroNIDAZOLE (FLAGYL) IVPB 500 mg     500 mg 100 mL/hr over 60 Minutes Intravenous  Once 11/24/17 1327 11/24/17 1658       Assessment/Plan HTN HLD CAD  s/p PCI 2018 Acute on chronic anemia -hgb decreased to 7.7, follow. No overt GI bleeding. DM2 uncontrolled  CKD with acute renal failure - nephrology consulted, suspect ATN, Cr increased to over 5 today Chronic back pain  Acute appendicitis S/pLAPAROSCOPIC ABDOMINAL EXPLORATION, DRAINAGE OF APPENDICEAL ABCESS. PLACEMENT OF DRAIN7/11 Dr. Lucia Gaskins - POD4, afebrile, VSS, WBC down to 13.5 -drain with 10cc serosanguinous drainage, continue JP drain - having some flatus and liquid BMs. -patient states this is his normal belly and he's hungry with no nausea.  Will advance to soft diet. - mobilize in the halls!!!!!!, IS q 1h - never had a colonoscopy before  ID -zosyn 7/9>> FEN -IVF,full liquids VTE -SCDs Foley -none      LOS: 6 days    Henreitta Cea , [facility] 11/30/2017, 9:15  AM Pager: [phone]

## 2017-11-30 NOTE — Progress Notes (Signed)
[facility] ROUNDING NOTE   Subjective:   No acute events overnight. Patient with several complaints this morning regarding his current diet. He also reports abdominal pain at the site of drain. Denies fever, chills, chest pain and shortness of breath.   Objective:  Vital signs in last 24 hours:  Temp:  [98.1 F (36.7 C)-99.1 F (37.3 C)] 99.1 F (37.3 C) (07/15 0415) Pulse Rate:  [65-84] 79 (07/15 0415) Resp:  [16-18] 16 (07/15 0415) BP: (125-160)/(67-75) 158/74 (07/15 0415) SpO2:  [98 %-100 %] 99 % (07/15 0415)  Weight change:  Filed Weights   11/24/17 1004 11/26/17 1507  Weight: 220 lb (99.8 kg) 220 lb (99.8 kg)    Intake/Output: I/O last 3 completed shifts: In: 2923.1 [P.O.:360; I.V.:1321.6; Blood:365.4; IV Piggyback:876] Out: 111 [Urine:101; Drains:10]   Intake/Output this shift:  No intake/output data recorded.  General: well-developed,well-nourished male, lying flat in bed in no acute distress  CVS- RRR, no mrg  RS- CTAB, no increased WOB  ABD- RLQ pain, drain in place, abdomen soft and non-distended, normoactive bowel sounds  EXT- trace bilateral edema   Basic Metabolic Panel: Recent Labs  Lab 11/26/17 0334 11/27/17 0525 11/28/17 0340 11/29/17 0711 11/30/17 0319  NA 139 138 136 137 141  K 3.2* 3.7 3.7 3.2* 3.2*  CL 102 101 103 105 108  CO2 24 24 22 22 23   GLUCOSE 99 140* 126* 98 154*  BUN 39* 43* 50* 52* 49*  CREATININE 2.62* 3.54* 4.61* 5.91* 5.38*  CALCIUM 8.2* 7.7* 7.9* 7.5* 7.5*  MG  --   --   --   --  2.2    Liver Function Tests: Recent Labs  Lab 11/24/17 1015  AST 39  ALT 33  ALKPHOS 94  BILITOT 0.7  PROT 6.9  ALBUMIN 2.7*   Recent Labs  Lab 11/24/17 1015  LIPASE 24   No results for input(s): AMMONIA in the last 168 hours.  CBC: Recent Labs  Lab 11/26/17 0334 11/27/17 0525 11/28/17 0340 11/29/17 0711 11/30/17 0319  WBC 24.6* 17.5* 17.4* 14.0* 13.5*  NEUTROABS 21.2*  --   --   --   --   HGB 10.1* 8.8*  8.9* 7.7* 8.7*  HCT 31.7* 28.8* 29.7* 24.9* 28.1*  MCV 104.6* 107.1* 108.4* 106.9* 104.1*  PLT 301 302 316 298 313    Cardiac Enzymes: No results for input(s): CKTOTAL, CKMB, CKMBINDEX, TROPONINI in the last 168 hours.  BNP: Invalid input(s): POCBNP  CBG: Recent Labs  Lab 11/29/17 1225 11/29/17 1632 11/29/17 1948 11/30/17 0614 11/30/17 1120  GLUCAP 109* 47* 147* 126* 139*    Microbiology: Results for orders placed or performed during the hospital encounter of 11/24/17  Surgical pcr screen     Status: None   Collection Time: 11/25/17  9:21 PM  Result Value Ref Range Status   MRSA, PCR NEGATIVE NEGATIVE Final   Staphylococcus aureus NEGATIVE NEGATIVE Final    Comment: (NOTE) The Xpert SA Assay (FDA approved for NASAL specimens in patients 51 years of age and older), is one component of a comprehensive surveillance program. It is not intended to diagnose infection nor to guide or monitor treatment. Performed at [facility], [place] [address] [place], [place] [address]   Culture, Urine     Status: None   Collection Time: 11/28/17 10:30 AM  Result Value Ref Range Status   Specimen Description URINE, CLEAN CATCH  Final   Special Requests NONE  Final   Culture  Final    NO GROWTH Performed at [facility], [place] [address] [place], [place] [address]    Report Status 11/29/2017 FINAL  Final    Coagulation Studies: No results for input(s): LABPROT, INR in the last 72 hours.  Urinalysis: Recent Labs    11/27/17 1313  COLORURINE AMBER*  LABSPEC 1.021  PHURINE 5.0  GLUCOSEU NEGATIVE  HGBUR MODERATE*  BILIRUBINUR NEGATIVE  KETONESUR NEGATIVE  PROTEINUR 100*  NITRITE NEGATIVE  LEUKOCYTESUR NEGATIVE      Imaging: No results found.   Medications:   . sodium chloride 10 mL/hr at 11/26/17 1509  . dextrose 5 % and 0.9% NaCl 125 mL/hr at 11/30/17 0944  . piperacillin-tazobactam (ZOSYN)  IV 2.25 g (11/30/17 0947)   . aspirin  81 mg Oral Daily  .  atorvastatin  40 mg Oral QHS  . dorzolamide-timolol  1 drop Both Eyes BID  . fluticasone  1 spray Each Nare Daily  . insulin aspart  0-9 Units Subcutaneous TID WC  . insulin detemir  30 Units Subcutaneous Daily  . isosorbide-hydrALAZINE  1 tablet Oral TID  . metoprolol succinate  100 mg Oral Daily   acetaminophen **OR** acetaminophen, ALPRAZolam, hydrALAZINE, HYDROmorphone (DILAUDID) injection, ondansetron **OR** ondansetron (ZOFRAN) IV  Assessment/ Plan:  David [name] is a 72 yo M with history of CAD s/p PCO 03/2017 on DAPT, CHF, CDK stage II, T2DM and HTN who presented with sepsis secondary to appendicitis due to appendiceal abscess. He is s/p drain placement by general surgery. Surgery was complicated by peri-op hypotension leading to acute renal failure from HTN.   Acute renal failure 2/2 ATN on CKD 2: Urine microscopy showed ATN. Renal US with no evidence of obstruction. Baseline Cr 1.4-1.6. Cr peaked at 5.91, it is at 5.38 today with GFR 11. No I/Os recorded today, but did have one unmeasured output. Appears euvolemic on exam.  - Renal dose medications for a GFR under 30. - Pt not on any nephrotoxic agents. - No acute indication for HD at this time   Anemia of CKD: Hgb ~ 9 at baseline. Required 1 unit of blood yesterday due to acute post-op blood loss. Hgb responded appropriately 7.7-> 8.7.  - Continue to monitor   Sepsis s/p drainage of appendiceal abscess: Afebrile and HDs. On Zosyn. S/p drain placement 7/11.   HTN: Holding Norvasc, continuing Bidil  CAD: On ASA, holding Plavix     LOS: 6 Idalys Santos-Sanchez 11/30/2017 @11 :33 AM   Renal Attending: Hopeful for recovery of renal fct as articulated above.  I agree with the assessment and plan. Erling Cruz, MD

## 2017-11-30 NOTE — Progress Notes (Signed)
PROGRESS NOTE  David [name]  YNW:[id number] DOB: [birth year] DOA: 11/24/2017 PCP: [name], MD  Outpatient Specialists: Cardiology, Dr. [name] Brief Narrative: David [name] is a 72 y.o. male with a history of CAD s/p stenting Nov 2018 on DAPT, previous CHF with most recent echo normal, T2DM, stage II CKD, HTN who presented with periumbilical abdominal pain found to have appendicitis related to appendicolith on imaging. Also with AKI and hyperglycemia. Surgery consulted and cardiology saw patient to inform decisions regarding antiplatelet therapy. Plavix has been held. Sepsis worsened despite antibiotics and surgery performed drainage of appendiceal abscess with drain placement 7/11. There was some perioperative hypotension and evidence of acute renal failure with ATN for which nephrology was consulted.   Assessment & Plan: Principal Problem:   Appendicitis Active Problems:   Hypertension   Diabetes mellitus ([place])   Status post coronary artery stent placement   Acute renal failure superimposed on stage 3 chronic kidney disease (HCC)  Sepsis due to acute appendiceal abscess: s/p drainage and drain placement 7/11 by Dr. Lucia Gaskins: Anticipate slow recovery given age.  - Diet per surgery.  - Discussed with pt, needs to mobilize and take fluids. PT/OT ordered. - Continue zosyn.  - Leukocytosis improving, Tmax 99.99F / 24 hrs.    Acute renal failure on stage II CKD: Microscopy consistent with florid ATN. Renal U/S negative.  - Need to continue IVF's. SCr might have peaked on 7/14, modest improvement today after bolus and PRBCs yesterday.  Still no edema, crackles, JVD, dyspnea, or orthopnea today. - Avoid hypotension, nephrotoxins. - Renally dosing medications - Continue monitoring UOP, Cr. K is mildly low, will check again in AM with Mg. Reluctant to supplement with renal failure.   CAD s/p PCI by Dr. [name] 04/06/2017: Maintained on DAPT. Appears euvolemic.  - Cardiology, Dr.  [name], evaluated patient. Continue ASA, ok to hold plavix for surgery.  - Defer to surgery, would like to restart plavix as soon as reasonable.  - Continue statin   Acute blood loss anemia on anemia of chronic disease: Hgb down 10.1 > 8.8 > 7.7 postop with stable platelet count. - Improved s/p 1u PRBCs 7/14. Continue monitoring and transfuse for hgb < 8 w/CAD. Drain output not significant blood loss.   HTN:  - Continue metoprolol, place hold parameters on bidil (don't want to precipitate angina by holding) - Holding norvasc. BPs up today, may need to restart.   IDT2DM: HbA1c 7.5%. - Continue levemir and sensitive SSI. At inpatient goal without hypoglycemia or much need for SSI.   Left hip pain: Reported fall in bathroom a few days PTA with resultant left lateral thigh pain. Resolved.   DVT prophylaxis: SCDs Code Status: Full Family Communication: None at bedside Disposition Plan: SNF when stabilized.   Consultants:   Surgery  Cardiology  Nephrology  Procedures:  11/26/17 LAPAROSCOPIC ABDOMINAL EXPLORATION, DRAINAGE OF APPENDICEAL ABCESS. PLACEMENT OF Merri Brunette, MD   Antimicrobials:  Zosyn  Subjective: States that he really hasn't had much abdominal pain but then begins grimacing intermittently reporting RLQ pain. Frustrated that drain is still in and he's not allowed to eat. +Flatus. Denies fever. States he's urinating but not documented yesterday.   Objective: Vitals:   11/29/17 1205 11/29/17 1415 11/29/17 1944 11/30/17 0415  BP: 134/69 139/75 (!) 160/71 (!) 158/74  Pulse: 65 67 79 79  Resp: 18 18  16   Temp: 98.2 F (36.8 C) 98.1 F (36.7 C) 98.7 F (37.1 C) 99.1 F (37.3 C)  TempSrc: Oral Oral Oral Oral  SpO2: 98% 98% 100% 99%  Weight:      Height:        Intake/Output Summary (Last 24 hours) at 11/30/2017 0755 Last data filed at 11/30/2017 0331 Gross per 24 hour  Intake 465.42 ml  Output 11 ml  Net 454.42 ml   Filed Weights   11/24/17 1004  11/26/17 1507  Weight: 99.8 kg (220 lb) 99.8 kg (220 lb)   Gen: 72 y.o. male in no distress Pulm: Nonlabored breathing room air. Clear. CV: Regular rate and rhythm. No murmur, rub, or gallop. No JVD, no dependent edema. GI: Abdomen soft, distended with RLQ tenderness, +BS. Drain with scant cloudy sanguinous output.  Ext: Warm, no deformities Skin: Incision sites w/dermabond without erythema, drain site dressing has dried blood, no active bleeding/discharge.  Neuro: Alert and oriented. No focal neurological deficits. Psych: Judgement and insight appear fair. Mood euthymic & affect congruent. Behavior is appropriate.    Data Reviewed: I have personally reviewed following labs and imaging studies  CBC: Recent Labs  Lab 11/26/17 0334 11/27/17 0525 11/28/17 0340 11/29/17 0711 11/30/17 0319  WBC 24.6* 17.5* 17.4* 14.0* 13.5*  NEUTROABS 21.2*  --   --   --   --   HGB 10.1* 8.8* 8.9* 7.7* 8.7*  HCT 31.7* 28.8* 29.7* 24.9* 28.1*  MCV 104.6* 107.1* 108.4* 106.9* 104.1*  PLT 301 302 316 298 500   Basic Metabolic Panel: Recent Labs  Lab 11/26/17 0334 11/27/17 0525 11/28/17 0340 11/29/17 0711 11/30/17 0319  NA 139 138 136 137 141  K 3.2* 3.7 3.7 3.2* 3.2*  CL 102 101 103 105 108  CO2 24 24 22 22 23   GLUCOSE 99 140* 126* 98 154*  BUN 39* 43* 50* 52* 49*  CREATININE 2.62* 3.54* 4.61* 5.91* 5.38*  CALCIUM 8.2* 7.7* 7.9* 7.5* 7.5*  MG  --   --   --   --  2.2   GFR: Estimated Creatinine Clearance: 16.1 mL/min (A) (by C-G formula based on SCr of 5.38 mg/dL (H)). Liver Function Tests: Recent Labs  Lab 11/24/17 1015  AST 39  ALT 33  ALKPHOS 94  BILITOT 0.7  PROT 6.9  ALBUMIN 2.7*   Recent Labs  Lab 11/24/17 1015  LIPASE 24   No results for input(s): AMMONIA in the last 168 hours. Coagulation Profile: No results for input(s): INR, PROTIME in the last 168 hours. Cardiac Enzymes: No results for input(s): CKTOTAL, CKMB, CKMBINDEX, TROPONINI in the last 168 hours. BNP  (last 3 results) No results for input(s): PROBNP in the last 8760 hours. HbA1C: No results for input(s): HGBA1C in the last 72 hours. CBG: Recent Labs  Lab 11/29/17 0636 11/29/17 1225 11/29/17 1632 11/29/17 1948 11/30/17 0614  GLUCAP 103* 109* 63* 147* 126*   Lipid Profile: No results for input(s): CHOL, HDL, LDLCALC, TRIG, CHOLHDL, LDLDIRECT in the last 72 hours. Thyroid Function Tests: No results for input(s): TSH, T4TOTAL, FREET4, T3FREE, THYROIDAB in the last 72 hours. Anemia Panel: No results for input(s): VITAMINB12, FOLATE, FERRITIN, TIBC, IRON, RETICCTPCT in the last 72 hours. Urine analysis:    Component Value Date/Time   COLORURINE AMBER (A) 11/27/2017 1313   APPEARANCEUR CLOUDY (A) 11/27/2017 1313   LABSPEC 1.021 11/27/2017 1313   PHURINE 5.0 11/27/2017 1313   GLUCOSEU NEGATIVE 11/27/2017 1313   HGBUR MODERATE (A) 11/27/2017 1313   BILIRUBINUR NEGATIVE 11/27/2017 1313   KETONESUR NEGATIVE 11/27/2017 1313   PROTEINUR 100 (A) 11/27/2017 1313  UROBILINOGEN 1.0 02/13/2015 1934   NITRITE NEGATIVE 11/27/2017 1313   LEUKOCYTESUR NEGATIVE 11/27/2017 1313   Recent Results (from the past 240 hour(s))  Surgical pcr screen     Status: None   Collection Time: 11/25/17  9:21 PM  Result Value Ref Range Status   MRSA, PCR NEGATIVE NEGATIVE Final   Staphylococcus aureus NEGATIVE NEGATIVE Final    Comment: (NOTE) The Xpert SA Assay (FDA approved for NASAL specimens in patients 28 years of age and older), is one component of a comprehensive surveillance program. It is not intended to diagnose infection nor to guide or monitor treatment. Performed at [facility], [place] [address] [place], [place] [address]   Culture, Urine     Status: None   Collection Time: 11/28/17 10:30 AM  Result Value Ref Range Status   Specimen Description URINE, CLEAN CATCH  Final   Special Requests NONE  Final   Culture   Final    NO GROWTH Performed at [facility], [place]  [address], [place], [place] [address]    Report Status 11/29/2017 FINAL  Final      Radiology Studies: No results found.  Scheduled Meds: . aspirin  81 mg Oral Daily  . atorvastatin  40 mg Oral QHS  . dorzolamide-timolol  1 drop Both Eyes BID  . fluticasone  1 spray Each Nare Daily  . insulin aspart  0-9 Units Subcutaneous TID WC  . insulin detemir  30 Units Subcutaneous Daily  . isosorbide-hydrALAZINE  1 tablet Oral TID  . metoprolol succinate  100 mg Oral Daily   Continuous Infusions: . sodium chloride 10 mL/hr at 11/26/17 1509  . dextrose 5 % and 0.9% NaCl 125 mL/hr at 11/28/17 1309  . piperacillin-tazobactam (ZOSYN)  IV 2.25 g (11/30/17 0421)     LOS: 6 days   Time spent: 25 minutes.  Patrecia Pour, MD [facility] [email or web address] Password [facility] 11/30/2017, 7:55 AM

## 2017-11-30 NOTE — Clinical Social Work Note (Signed)
Clinical Social Work Assessment  Patient Details  Name: David [name] MRN: [id number] Date of Birth: 01-Nov-1945  Date of referral:  11/30/17               Reason for consult:  Facility Placement                Permission sought to share information with:  Family Supports Permission granted to share information::  Yes, Release of Information Signed  Name::     Therapist, art::  [place] facilities  Relationship::  Spouse  Contact Information:  [phone]  Housing/Transportation Living arrangements for the past 2 months:  [place] of Information:  Spouse Patient Interpreter Needed:  None Criminal Activity/Legal Involvement Pertinent to Current Situation/Hospitalization:  No - Comment as needed Significant Relationships:  Spouse Lives with:  Spouse Do you feel safe going back to the place where you live?  No Need for family participation in patient care:  No (Coment)  Care giving concerns:  Pt is alert and oriented. Pt was in the restroom upon CSW arrival. Pt's wife requested to speak to the CSW.   Social Worker assessment / plan:  CSW spoke with pt's spouse at bedside. Pt was in the restroom during conversation. Pt's spouse states she is unable to physical lift, or move the pt as she has some health problems of her own. Pt's spouse states pt is only concerned about getting "three meals a day" wherever he goes. Pt's spouse states pt thinks he is going to be able to have his car there. Pt's spouse ask that pt be faxed out to [facility] facilities. Pt's spouse will be at the hospital tomorrow around 1 and is requesting CSW come back to have the conversation with the pt as well.   Employment status:  Retired Nurse, adult PT Recommendations:  [place] / Referral to community resources:  [place]  Patient/Family's Response to care:  Pt's spouse verbalized understanding of CSW role and  expressed appreciation for support. Pt's spouse denies any concern regarding pt care at this time.   Patient/Family's Understanding of and Emotional Response to Diagnosis, Current Treatment, and Prognosis:  Pt's spouse understanding and realistic regarding pt's physical limitations. Pt's spouse understands the need for pt to go to SNF -- Pt's spouse agreeable at this time. Pt's spouse denies any concern regarding pt's treatment plan at this time. CSW will continue to provide support and facilitate d/c needs.   Emotional Assessment Appearance:  Appears stated age Attitude/Demeanor/Rapport:  Unable to Assess Affect (typically observed):  Unable to Assess Orientation:  Oriented to Self, Oriented to Place, Oriented to  Time, Oriented to Situation Alcohol / Substance use:  Not Applicable Psych involvement (Current and /or in the community):  No (Comment)  Discharge Needs  Concerns to be addressed:  Care Coordination, Basic Needs Readmission within the last 30 days:  No Current discharge risk:  Dependent with Mobility Barriers to Discharge:  Continued Medical Work up   [employer], LCSW 11/30/2017, 4:31 PM

## 2017-11-30 NOTE — Progress Notes (Signed)
OT Cancellation Note  Patient Details Name: David [name] MRN: [id number] DOB: [birth year]   Cancelled Treatment:    Reason Eval/Treat Not Completed: Patient declined, no reason specified. Pt adamantly refused despite being educated on the importance/benefits and therapy. OT also explained to pt what would be expected of him at [place], however pt continued to refuse  David [name] 11/30/2017, 11:49 AM

## 2017-12-01 LAB — GLUCOSE, CAPILLARY
GLUCOSE-CAPILLARY: 80 mg/dL (ref 70–99)
GLUCOSE-CAPILLARY: 150 mg/dL — AB (ref 70–99)
Glucose-Capillary: 75 mg/dL (ref 70–99)
Glucose-Capillary: 131 mg/dL — ABNORMAL HIGH (ref 70–99)

## 2017-12-01 LAB — BASIC METABOLIC PANEL
Anion gap: 9 (ref 5–15)
BUN: 33 mg/dL — AB (ref 8–23)
CHLORIDE: 111 mmol/L (ref 98–111)
CO2: 25 mmol/L (ref 22–32)
CREATININE: 3.07 mg/dL — AB (ref 0.61–1.24)
Calcium: 8.1 mg/dL — ABNORMAL LOW (ref 8.9–10.3)
GFR calc Af Amer: 22 mL/min — ABNORMAL LOW (ref >60)
GFR calc non Af Amer: 19 mL/min — ABNORMAL LOW (ref >60)
GLUCOSE: 85 mg/dL (ref 70–99)
POTASSIUM: 3.2 mmol/L — AB (ref 3.5–5.1)
Sodium: 145 mmol/L (ref 135–145)

## 2017-12-01 MED ORDER — AMLODIPINE BESYLATE 5 MG PO TABS
5.0000 mg | ORAL_TABLET | Freq: Every day | ORAL | Status: DC
  Administered 2017-12-02 – 2017-12-07 (×6): 5 mg via ORAL
  Filled 2017-12-01 (×6): qty 1

## 2017-12-01 MED ORDER — SACCHAROMYCES BOULARDII 250 MG PO CAPS
250.0000 mg | ORAL_CAPSULE | Freq: Two times a day (BID) | ORAL | Status: DC
  Administered 2017-12-01 – 2017-12-09 (×17): 250 mg via ORAL
  Filled 2017-12-01 (×17): qty 1

## 2017-12-01 MED ORDER — DEXTROSE-NACL 5-0.9 % IV SOLN
INTRAVENOUS | Status: DC
  Administered 2017-12-01 – 2017-12-02 (×2): via INTRAVENOUS

## 2017-12-01 MED ORDER — ENSURE ENLIVE PO LIQD
237.0000 mL | Freq: Two times a day (BID) | ORAL | Status: DC
  Administered 2017-12-02: 237 mL via ORAL

## 2017-12-01 NOTE — Progress Notes (Addendum)
[facility] Progress Note  5 Days Post-Op  Subjective: CC- diarrhea Main complaint this morning is diarrhea. States that he is going every hour. It is loose and watery.  Started on a soft diet but states that he does not have much of an appetite so he is not eating. Intermittent nausea, no emesis. States that he still has abdominal pain, mostly RLQ around the drain. Refused to work with therapies yesterday and is not getting out of bed.  Objective: Vital signs in last 24 hours: Temp:  [98 F (36.7 C)-98.3 F (36.8 C)] 98.3 F (36.8 C) (07/16 0520) Pulse Rate:  [65-79] 79 (07/16 0520) Resp:  [16-17] 17 (07/16 0520) BP: (160-169)/(75-82) 169/80 (07/16 1004) SpO2:  [95 %-99 %] 95 % (07/16 0520) Last BM Date: 11/30/17  Intake/Output from previous day: 07/15 0701 - 07/16 0700 In: 480 [P.O.:480] Out: 300 [Urine:300] Intake/Output this shift: Total I/O In: 0  Out: 300 [Urine:300]  PE: Gen:  Alert, NAD Pulm:  effort normal Abd: Soft, distended, +BS, drain with minimal serosanguinous drainage, incisions cdi, mild TTP RLQ around drain  Lab Results:  Recent Labs    11/29/17 0711 11/30/17 0319  WBC 14.0* 13.5*  HGB 7.7* 8.7*  HCT 24.9* 28.1*  PLT 298 313   BMET Recent Labs    11/29/17 0711 11/30/17 0319  NA 137 141  K 3.2* 3.2*  CL 105 108  CO2 22 23  GLUCOSE 98 154*  BUN 52* 49*  CREATININE 5.91* 5.38*  CALCIUM 7.5* 7.5*   PT/INR No results for input(s): LABPROT, INR in the last 72 hours. CMP     Component Value Date/Time   NA 141 11/30/2017 0319   K 3.2 (L) 11/30/2017 0319   CL 108 11/30/2017 0319   CO2 23 11/30/2017 0319   GLUCOSE 154 (H) 11/30/2017 0319   BUN 49 (H) 11/30/2017 0319   CREATININE 5.38 (H) 11/30/2017 0319   CALCIUM 7.5 (L) 11/30/2017 0319   PROT 6.9 11/24/2017 1015   ALBUMIN 2.7 (L) 11/24/2017 1015   AST 39 11/24/2017 1015   ALT 33 11/24/2017 1015   ALKPHOS 94 11/24/2017 1015   BILITOT 0.7 11/24/2017 1015   GFRNONAA  10 (L) 11/30/2017 0319   GFRAA 11 (L) 11/30/2017 0319   Lipase     Component Value Date/Time   LIPASE 24 11/24/2017 1015       Studies/Results: No results found.  Anti-infectives: Anti-infectives (From admission, onward)   Start     Dose/Rate Route Frequency Ordered Stop   11/28/17 1900  piperacillin-tazobactam (ZOSYN) IVPB 2.25 g     2.25 g 100 mL/hr over 30 Minutes Intravenous Every 8 hours 11/28/17 1602     11/25/17 0300  piperacillin-tazobactam (ZOSYN) IVPB 3.375 g  Status:  Discontinued     3.375 g 12.5 mL/hr over 240 Minutes Intravenous Every 8 hours 11/24/17 1951 11/28/17 1602   11/24/17 1930  piperacillin-tazobactam (ZOSYN) IVPB 3.375 g     3.375 g 100 mL/hr over 30 Minutes Intravenous  Once 11/24/17 1924 11/24/17 2229   11/24/17 1330  cefTRIAXone (ROCEPHIN) 2 g in sodium chloride 0.9 % 100 mL IVPB     2 g 200 mL/hr over 30 Minutes Intravenous  Once 11/24/17 1327 11/24/17 1456   11/24/17 1330  metroNIDAZOLE (FLAGYL) IVPB 500 mg     500 mg 100 mL/hr over 60 Minutes Intravenous  Once 11/24/17 1327 11/24/17 1658       Assessment/Plan HTN HLD CAD s/p PCI 2018 Acute  on chronic anemia -hgb 8.7 yesterday, pending today DM2 uncontrolled  CKD with acute renal failure - nephrology consulted,suspect ATN,Cr hopefully peaked at 8.91 and has been trending down, BMP pending today Chronic back pain  Acute appendicitis S/pLAPAROSCOPIC ABDOMINAL EXPLORATION, DRAINAGE OF APPENDICEAL ABCESS. PLACEMENT OF DRAIN7/11 Dr. Lucia Gaskins - POD5, afebrile, VSS, WBCdown to 13.5 -drain with0cc serosanguinous drainage recorded, continue JP drain - having some flatus and hourly looseBMs. - never had a colonoscopy before  ID -zosyn 7/9>> FEN -IVF,soft diet, Ensure VTE -SCDs Foley -none  Plan: Labs pending. Check for c diff. Patient was complaining of not being fed, now on soft diet and not eating due to lack of appetite. Add ensure. Encouraged more OOB/ambulation and to  work with PT/OT. Add probiotic.   LOS: 7 days    [place] , [facility] 12/01/2017, 11:26 AM Pager: [phone] Consults: [phone] Mon 7:00 am -11:30 AM Tues-Fri 7:00 am-4:30 pm Sat-Sun 7:00 am-11:30 am

## 2017-12-01 NOTE — Progress Notes (Signed)
[facility] ROUNDING NOTE   Subjective:   No acute events overnight. sBP 160s. Noted patient has been refusing some of his antihypertensive doses. He is sleepy this morning and does not want to be disturbed as he was unable to sleep last night. States he continues to make urine and is voiding frequently.   Objective:  Vital signs in last 24 hours:  Temp:  [98 F (36.7 C)-98.3 F (36.8 C)] 98.3 F (36.8 C) (07/16 0520) Pulse Rate:  [65-79] 79 (07/16 0520) Resp:  [16-17] 17 (07/16 0520) BP: (160-169)/(75-82) 169/80 (07/16 0520) SpO2:  [95 %-99 %] 95 % (07/16 0520)  Weight change:  Filed Weights   11/24/17 1004 11/26/17 1507  Weight: 220 lb (99.8 kg) 220 lb (99.8 kg)    Intake/Output: I/O last 3 completed shifts: In: 480 [P.O.:480] Out: 311 [Urine:301; Drains:10]   Intake/Output this shift:  Total I/O In: 0  Out: 300 [Urine:300]  General: well-developed, well-nourished male, sleeping in bed in NAD  CVS- RRR, no mrg  RS- CTAD, no increased WOB ABD- abdomen is soft and distended, tender at drain site, normoactive bowel sounds  EXT- no edema bilaterally    Basic Metabolic Panel: Recent Labs  Lab 11/26/17 0334 11/27/17 0525 11/28/17 0340 11/29/17 0711 11/30/17 0319  NA 139 138 136 137 141  K 3.2* 3.7 3.7 3.2* 3.2*  CL 102 101 103 105 108  CO2 24 24 22 22 23   GLUCOSE 99 140* 126* 98 154*  BUN 39* 43* 50* 52* 49*  CREATININE 2.62* 3.54* 4.61* 5.91* 5.38*  CALCIUM 8.2* 7.7* 7.9* 7.5* 7.5*  MG  --   --   --   --  2.2    Liver Function Tests: Recent Labs  Lab 11/24/17 1015  AST 39  ALT 33  ALKPHOS 94  BILITOT 0.7  PROT 6.9  ALBUMIN 2.7*   Recent Labs  Lab 11/24/17 1015  LIPASE 24   No results for input(s): AMMONIA in the last 168 hours.  CBC: Recent Labs  Lab 11/26/17 0334 11/27/17 0525 11/28/17 0340 11/29/17 0711 11/30/17 0319  WBC 24.6* 17.5* 17.4* 14.0* 13.5*  NEUTROABS 21.2*  --   --   --   --   HGB 10.1* 8.8* 8.9* 7.7*  8.7*  HCT 31.7* 28.8* 29.7* 24.9* 28.1*  MCV 104.6* 107.1* 108.4* 106.9* 104.1*  PLT 301 302 316 298 313    Cardiac Enzymes: No results for input(s): CKTOTAL, CKMB, CKMBINDEX, TROPONINI in the last 168 hours.  BNP: Invalid input(s): POCBNP  CBG: Recent Labs  Lab 11/30/17 0614 11/30/17 1120 11/30/17 1634 11/30/17 2129 12/01/17 0640  GLUCAP 126* 139* 134* 105* 5    Microbiology: Results for orders placed or performed during the hospital encounter of 11/24/17  Surgical pcr screen     Status: None   Collection Time: 11/25/17  9:21 PM  Result Value Ref Range Status   MRSA, PCR NEGATIVE NEGATIVE Final   Staphylococcus aureus NEGATIVE NEGATIVE Final    Comment: (NOTE) The Xpert SA Assay (FDA approved for NASAL specimens in patients 57 years of age and older), is one component of a comprehensive surveillance program. It is not intended to diagnose infection nor to guide or monitor treatment. Performed at [facility], [place] [address] [place],  [address]   Culture, Urine     Status: None   Collection Time: 11/28/17 10:30 AM  Result Value Ref Range Status   Specimen Description URINE, CLEAN CATCH  Final  Special Requests NONE  Final   Culture   Final    NO GROWTH Performed at [facility], [place] [address], [place], [place] [address]    Report Status 11/29/2017 FINAL  Final    Coagulation Studies: No results for input(s): LABPROT, INR in the last 72 hours.  Urinalysis: No results for input(s): COLORURINE, LABSPEC, PHURINE, GLUCOSEU, HGBUR, BILIRUBINUR, KETONESUR, PROTEINUR, UROBILINOGEN, NITRITE, LEUKOCYTESUR in the last 72 hours.  Invalid input(s): APPERANCEUR    Imaging: No results found.   Medications:   . sodium chloride 10 mL/hr at 11/26/17 1509  . dextrose 5 % and 0.9% NaCl 125 mL/hr at 11/30/17 0944  . piperacillin-tazobactam (ZOSYN)  IV 2.25 g (12/01/17 0536)   . aspirin  81 mg Oral Daily  . atorvastatin  40 mg Oral QHS  .  dorzolamide-timolol  1 drop Both Eyes BID  . fluticasone  1 spray Each Nare Daily  . insulin aspart  0-9 Units Subcutaneous TID WC  . insulin detemir  30 Units Subcutaneous Daily  . isosorbide-hydrALAZINE  1 tablet Oral TID  . metoprolol succinate  100 mg Oral Daily   acetaminophen **OR** acetaminophen, ALPRAZolam, hydrALAZINE, HYDROmorphone (DILAUDID) injection, ondansetron **OR** ondansetron (ZOFRAN) IV  Assessment/ Plan:  Mr. Macaraeg is a 72 yo M with history of CAD s/p PCO 03/2017 on DAPT, CHF, CDK stage II, T2DM and HTN who presented with sepsis secondary to appendicitis due to appendiceal abscess. He is s/p drain placement by general surgery. Surgery was complicated by peri-op hypotension leading to acute renal failure from HTN.   Acute renal failure 2/2 ATN on CKD 2: Baseline Cr 1.4-1.6. Cr 5.38 yesterday. UOP 300cc with 7 unmeasured outputs. Ordered BMP this AM to check renal function.  - Renal dose medications for a GFR under 30. - Pt not on any nephrotoxic agents. - No acute indication for HD at this time   Anemia of CKD: Hgb ~ 9 at baseline. Required 1 unit of blood yesterday due to acute post-op blood loss. Hgb responded appropriately 7.7-> 8.7. No CBC ordered for today.  - Continue to monitor   Sepsis s/p drainage of appendiceal abscess: Afebrile and HDs. On Zosyn. S/p drain placement 7/11. Surgery following. Now having diarrhea and C diff testing ordered.   HTN: Holding Norvasc, continuing Bidil  CAD: On ASA, holding Plavix     LOS: 7 Trygg Mantz Santos-Sanchez 12/01/2017 10:02 AM

## 2017-12-01 NOTE — Progress Notes (Signed)
PT Cancellation Note  Patient Details Name: David [name] MRN: [id number] DOB: 1945/06/11   Cancelled Treatment:    Reason Eval/Treat Not Completed: Patient declined, no reason specified. Pt reports he has been up to the restroom frequently with nursing and does not wish to ambulate at this time with PT secondary to abdominal pain and diarrhea. Pt request PT try back another day.  Benjiman Core, PTA Pager [phone] Acute Rehab  Allena Katz 12/01/2017, 2:24 PM

## 2017-12-01 NOTE — Progress Notes (Signed)
PROGRESS NOTE  David [name]  XAJ:[id number] DOB: April 20, 1946 DOA: 11/24/2017 PCP: [name], MD  Outpatient Specialists: Cardiology, Dr. [name] Brief Narrative: David [name] is a 72 y.o. male with a history of CAD s/p stenting Nov 2018 on DAPT, previous CHF with most recent echo normal, T2DM, stage II CKD, HTN who presented with periumbilical abdominal pain found to have appendicitis related to appendicolith on imaging. Also with AKI and hyperglycemia. Surgery consulted and cardiology saw patient to inform decisions regarding antiplatelet therapy. Plavix has been held. Sepsis worsened despite antibiotics and surgery performed drainage of appendiceal abscess with drain placement 7/11. There was some perioperative hypotension and evidence of acute renal failure with ATN for which nephrology was consulted.   Assessment & Plan: Principal Problem:   Appendicitis Active Problems:   Hypertension   Diabetes mellitus ([place])   Status post coronary artery stent placement   Acute renal failure superimposed on stage 3 chronic kidney disease (HCC)  Sepsis due to acute appendiceal abscess: s/p drainage and drain placement 7/11 by Dr. Lucia Gaskins: Anticipate slow recovery given age.  - Diet per surgery.  - Discussed with pt, needs to mobilize and take fluids. PT/OT ordered. I've discussed at length with patient need for mobilizing, wife also attempting to encourage patient. His recovery is slower because of reluctance to advance diet and to ambulate.  - Continue zosyn given significant inflammation seen at time of surgery, ongoing leukocytosis.  - Leukocytosis improving, Tmax 98.60F / 24 hrs.    Acute renal failure on stage II CKD: Microscopy consistent with florid ATN. Renal U/S negative.  - Repeat labs pending. Appreciate nephrology recommendations.  - Avoid hypotension, nephrotoxins. - Renally dosing medications - Continue monitoring UOP, Cr.   Hypokalemia:  - Per nephrology  Watery diarrhea:  Fortunately no worsening of abd pain or fever, WBC improving.  - CDiff ordered in setting of significant abx exposure. Treat as indicated. - Probiotics  CAD s/p PCI by Dr. [name] 04/06/2017: Maintained on DAPT. Appears euvolemic.  - Cardiology, Dr. [name], evaluated patient. Continue ASA, ok to hold plavix for surgery.  - Defer to surgery, would like to restart plavix as soon as reasonable.  - Continue statin   Acute blood loss anemia on anemia of chronic disease: Hgb down 10.1 > 8.8 > 7.7 postop with stable platelet count. - Improved s/p 1u PRBCs 7/14. Continue monitoring and transfuse for hgb < 8 w/CAD. Drain output not significant blood loss.  - Daily CBC (pending today)  HTN:  - Continue metoprolol, place hold parameters on bidil (don't want to precipitate angina by holding) - Restart norvasc.  IDT2DM: HbA1c 7.5%. - Continue levemir and sensitive SSI. At inpatient goal without hypoglycemia or much need for SSI. Will decrease levemir to prevent stacking with renal failure and no po intake. May require D5 gtt   Left hip pain: Reported fall in bathroom a few days PTA with resultant left lateral thigh pain. Resolved.   DVT prophylaxis: SCDs Code Status: Full Family Communication: Wife at bedside, and by phone yesterday. Disposition Plan: SNF when stabilized.   Consultants:   Surgery  Cardiology  Nephrology  Procedures:  11/26/17 LAPAROSCOPIC ABDOMINAL EXPLORATION, DRAINAGE OF APPENDICEAL ABCESS. PLACEMENT OF Merri Brunette, MD   Antimicrobials:  Zosyn  Subjective: Having watery stools without blood q1h, feels urgency with any po intake so isn't taking anything at all. Reports good UOP. Doesn't want to get up. His wife states he always has severe back pain with plans for back  surgery previously.    Objective: Vitals:   11/30/17 2007 12/01/17 0520 12/01/17 1004 12/01/17 1421  BP: (!) 165/82 (!) 169/80 (!) 169/80 (!) 170/77  Pulse: 73 79  64  Resp: 16 17  16     Temp: 98 F (36.7 C) 98.3 F (36.8 C)  98.7 F (37.1 C)  TempSrc: Oral Oral  Oral  SpO2: 99% 95%  96%  Weight:      Height:        Intake/Output Summary (Last 24 hours) at 12/01/2017 1516 Last data filed at 12/01/2017 0800 Gross per 24 hour  Intake 120 ml  Output 600 ml  Net -480 ml   Filed Weights   11/24/17 1004 11/26/17 1507  Weight: 99.8 kg (220 lb) 99.8 kg (220 lb)   Gen: 72 y.o. male in no distress Pulm: Nonlabored breathing room air. Clear. CV: Regular rate and rhythm. No murmur, rub, or gallop. No JVD, no dependent edema. GI: Abdomen soft, bloated but not really distended, tender in RLQ without rebound or guarding. +BS. No left-sided tenderness  Ext: Warm, no deformities Skin: Lap incision sites c/d/i with dermabond, RLQ drain site dressing c/d/i. No rashes, lesions or ulcers on visualized skin.  Neuro: Alert and oriented. No focal neurological deficits. Psych: Judgement and insight appear fair. Appears frustrated. Behavior is appropriate.    CBC: Recent Labs  Lab 11/26/17 0334 11/27/17 0525 11/28/17 0340 11/29/17 0711 11/30/17 0319  WBC 24.6* 17.5* 17.4* 14.0* 13.5*  NEUTROABS 21.2*  --   --   --   --   HGB 10.1* 8.8* 8.9* 7.7* 8.7*  HCT 31.7* 28.8* 29.7* 24.9* 28.1*  MCV 104.6* 107.1* 108.4* 106.9* 104.1*  PLT 301 302 316 298 951   Basic Metabolic Panel: Recent Labs  Lab 11/26/17 0334 11/27/17 0525 11/28/17 0340 11/29/17 0711 11/30/17 0319  NA 139 138 136 137 141  K 3.2* 3.7 3.7 3.2* 3.2*  CL 102 101 103 105 108  CO2 24 24 22 22 23   GLUCOSE 99 140* 126* 98 154*  BUN 39* 43* 50* 52* 49*  CREATININE 2.62* 3.54* 4.61* 5.91* 5.38*  CALCIUM 8.2* 7.7* 7.9* 7.5* 7.5*  MG  --   --   --   --  2.2   GFR: Estimated Creatinine Clearance: 16.1 mL/min (A) (by C-G formula based on SCr of 5.38 mg/dL (H)). Liver Function Tests: No results for input(s): AST, ALT, ALKPHOS, BILITOT, PROT, ALBUMIN in the last 168 hours. No results for input(s): LIPASE, AMYLASE  in the last 168 hours. No results for input(s): AMMONIA in the last 168 hours. Coagulation Profile: No results for input(s): INR, PROTIME in the last 168 hours. Cardiac Enzymes: No results for input(s): CKTOTAL, CKMB, CKMBINDEX, TROPONINI in the last 168 hours. BNP (last 3 results) No results for input(s): PROBNP in the last 8760 hours. HbA1C: No results for input(s): HGBA1C in the last 72 hours. CBG: Recent Labs  Lab 11/30/17 1120 11/30/17 1634 11/30/17 2129 12/01/17 0640 12/01/17 1123  GLUCAP 139* 134* 105* 75 80   Lipid Profile: No results for input(s): CHOL, HDL, LDLCALC, TRIG, CHOLHDL, LDLDIRECT in the last 72 hours. Thyroid Function Tests: No results for input(s): TSH, T4TOTAL, FREET4, T3FREE, THYROIDAB in the last 72 hours. Anemia Panel: No results for input(s): VITAMINB12, FOLATE, FERRITIN, TIBC, IRON, RETICCTPCT in the last 72 hours. Urine analysis:    Component Value Date/Time   COLORURINE AMBER (A) 11/27/2017 1313   APPEARANCEUR CLOUDY (A) 11/27/2017 1313   LABSPEC 1.021 11/27/2017 1313  PHURINE 5.0 11/27/2017 1313   GLUCOSEU NEGATIVE 11/27/2017 1313   HGBUR MODERATE (A) 11/27/2017 1313   BILIRUBINUR NEGATIVE 11/27/2017 1313   KETONESUR NEGATIVE 11/27/2017 1313   PROTEINUR 100 (A) 11/27/2017 1313   UROBILINOGEN 1.0 02/13/2015 1934   NITRITE NEGATIVE 11/27/2017 1313   LEUKOCYTESUR NEGATIVE 11/27/2017 1313   Recent Results (from the past 240 hour(s))  Surgical pcr screen     Status: None   Collection Time: 11/25/17  9:21 PM  Result Value Ref Range Status   MRSA, PCR NEGATIVE NEGATIVE Final   Staphylococcus aureus NEGATIVE NEGATIVE Final    Comment: (NOTE) The Xpert SA Assay (FDA approved for NASAL specimens in patients 61 years of age and older), is one component of a comprehensive surveillance program. It is not intended to diagnose infection nor to guide or monitor treatment. Performed at [facility], [place] [address], [place],  [place] [address]   Culture, Urine     Status: None   Collection Time: 11/28/17 10:30 AM  Result Value Ref Range Status   Specimen Description URINE, CLEAN CATCH  Final   Special Requests NONE  Final   Culture   Final    NO GROWTH Performed at [facility], [place] [address] [place], [place] [address]    Report Status 11/29/2017 FINAL  Final      Radiology Studies: No results found.  Scheduled Meds: . aspirin  81 mg Oral Daily  . atorvastatin  40 mg Oral QHS  . dorzolamide-timolol  1 drop Both Eyes BID  . feeding supplement (ENSURE ENLIVE)  237 mL Oral BID BM  . fluticasone  1 spray Each Nare Daily  . insulin aspart  0-9 Units Subcutaneous TID WC  . insulin detemir  30 Units Subcutaneous Daily  . isosorbide-hydrALAZINE  1 tablet Oral TID  . metoprolol succinate  100 mg Oral Daily  . saccharomyces boulardii  250 mg Oral BID   Continuous Infusions: . sodium chloride 10 mL/hr at 11/26/17 1509  . dextrose 5 % and 0.9% NaCl 125 mL/hr at 11/30/17 0944  . piperacillin-tazobactam (ZOSYN)  IV 2.25 g (12/01/17 1130)     LOS: 7 days   Time spent: 25 minutes.  Patrecia Pour, MD [facility] [email or web address] Password [facility] 12/01/2017, 3:16 PM

## 2017-12-02 LAB — BASIC METABOLIC PANEL
ANION GAP: 8 (ref 5–15)
BUN: 27 mg/dL — ABNORMAL HIGH (ref 8–23)
CALCIUM: 7.9 mg/dL — AB (ref 8.9–10.3)
CO2: 25 mmol/L (ref 22–32)
CREATININE: 2.53 mg/dL — AB (ref 0.61–1.24)
Chloride: 112 mmol/L — ABNORMAL HIGH (ref 98–111)
GFR calc non Af Amer: 24 mL/min — ABNORMAL LOW (ref >60)
GFR, EST AFRICAN AMERICAN: 28 mL/min — AB (ref >60)
Glucose, Bld: 143 mg/dL — ABNORMAL HIGH (ref 70–99)
Potassium: 3 mmol/L — ABNORMAL LOW (ref 3.5–5.1)
SODIUM: 145 mmol/L (ref 135–145)

## 2017-12-02 LAB — CBC
HCT: 25.6 % — ABNORMAL LOW (ref 39.0–52.0)
Hemoglobin: 8 g/dL — ABNORMAL LOW (ref 13.0–17.0)
MCH: 32.3 pg (ref 26.0–34.0)
MCHC: 31.3 g/dL (ref 30.0–36.0)
MCV: 103.2 fL — ABNORMAL HIGH (ref 78.0–100.0)
PLATELETS: 318 10*3/uL (ref 150–400)
RBC: 2.48 MIL/uL — AB (ref 4.22–5.81)
RDW: 12.4 % (ref 11.5–15.5)
WBC: 13.5 10*3/uL — ABNORMAL HIGH (ref 4.0–10.5)

## 2017-12-02 LAB — GLUCOSE, CAPILLARY
GLUCOSE-CAPILLARY: 133 mg/dL — AB (ref 70–99)
GLUCOSE-CAPILLARY: 229 mg/dL — AB (ref 70–99)
Glucose-Capillary: 136 mg/dL — ABNORMAL HIGH (ref 70–99)
Glucose-Capillary: 165 mg/dL — ABNORMAL HIGH (ref 70–99)
Glucose-Capillary: 252 mg/dL — ABNORMAL HIGH (ref 70–99)

## 2017-12-02 MED ORDER — PIPERACILLIN-TAZOBACTAM 3.375 G IVPB
3.3750 g | Freq: Three times a day (TID) | INTRAVENOUS | Status: DC
  Administered 2017-12-02 – 2017-12-05 (×9): 3.375 g via INTRAVENOUS
  Filled 2017-12-02 (×10): qty 50

## 2017-12-02 MED ORDER — POTASSIUM CHLORIDE 20 MEQ PO PACK
20.0000 meq | PACK | Freq: Once | ORAL | Status: AC
  Administered 2017-12-02: 20 meq via ORAL
  Filled 2017-12-02: qty 1

## 2017-12-02 MED ORDER — DEXTROSE-NACL 5-0.45 % IV SOLN
INTRAVENOUS | Status: AC
  Administered 2017-12-06 – 2017-12-07 (×2): via INTRAVENOUS

## 2017-12-02 MED ORDER — TRAMADOL HCL 50 MG PO TABS
50.0000 mg | ORAL_TABLET | Freq: Four times a day (QID) | ORAL | Status: DC | PRN
  Administered 2017-12-02: 50 mg via ORAL
  Filled 2017-12-02 (×3): qty 1

## 2017-12-02 NOTE — Progress Notes (Signed)
[facility] Progress Note  6 Days Post-Op  Subjective: CC: decreased appetite Patient reports he is trying to eat but just has no appetite, tolerating ensures. Abdominal pain when getting back into bed. Was having loose BMs yesterday, now reports no flatus and no BMs (although BMs recorded in output). Denies nausea. Did sit up in chair this AM.   Objective: Vital signs in last 24 hours: Temp:  [98.7 F (37.1 C)] 98.7 F (37.1 C) (07/16 2031) Pulse Rate:  [64] 64 (07/17 1042) Resp:  [16] 16 (07/16 2031) BP: (158-170)/(72-77) 158/72 (07/17 1042) SpO2:  [96 %-98 %] 98 % (07/16 2031) Last BM Date: 12/01/17(numerous diarrhea stools x 4)  Intake/Output from previous day: 07/16 0701 - 07/17 0700 In: 987 [P.O.:237; I.V.:700; IV Piggyback:50] Out: 318 [Urine:300; Drains:18] Intake/Output this shift: Total I/O In: -  Out: 300 [Urine:300]  PE: Gen:  Alert, NAD, pleasant Pulm:  Normal effort, clear to auscultation bilaterally Abd: Soft, minimally TTP, mild distention, incisions c/d/i, drain with SS drainage Skin: warm and dry, no rashes  Psych: A&Ox3   Lab Results:  Recent Labs    11/30/17 0319 12/02/17 0446  WBC 13.5* 13.5*  HGB 8.7* 8.0*  HCT 28.1* 25.6*  PLT 313 318   BMET Recent Labs    12/01/17 1417 12/02/17 0446  NA 145 145  K 3.2* 3.0*  CL 111 112*  CO2 25 25  GLUCOSE 85 143*  BUN 33* 27*  CREATININE 3.07* 2.53*  CALCIUM 8.1* 7.9*   PT/INR No results for input(s): LABPROT, INR in the last 72 hours. CMP     Component Value Date/Time   NA 145 12/02/2017 0446   K 3.0 (L) 12/02/2017 0446   CL 112 (H) 12/02/2017 0446   CO2 25 12/02/2017 0446   GLUCOSE 143 (H) 12/02/2017 0446   BUN 27 (H) 12/02/2017 0446   CREATININE 2.53 (H) 12/02/2017 0446   CALCIUM 7.9 (L) 12/02/2017 0446   PROT 6.9 11/24/2017 1015   ALBUMIN 2.7 (L) 11/24/2017 1015   AST 39 11/24/2017 1015   ALT 33 11/24/2017 1015   ALKPHOS 94 11/24/2017 1015   BILITOT 0.7 11/24/2017  1015   GFRNONAA 24 (L) 12/02/2017 0446   GFRAA 28 (L) 12/02/2017 0446   Lipase     Component Value Date/Time   LIPASE 24 11/24/2017 1015       Studies/Results: No results found.  Anti-infectives: Anti-infectives (From admission, onward)   Start     Dose/Rate Route Frequency Ordered Stop   12/02/17 1100  piperacillin-tazobactam (ZOSYN) IVPB 3.375 g     3.375 g 12.5 mL/hr over 240 Minutes Intravenous Every 8 hours 12/02/17 0959     11/28/17 1900  piperacillin-tazobactam (ZOSYN) IVPB 2.25 g  Status:  Discontinued     2.25 g 100 mL/hr over 30 Minutes Intravenous Every 8 hours 11/28/17 1602 12/02/17 0959   11/25/17 0300  piperacillin-tazobactam (ZOSYN) IVPB 3.375 g  Status:  Discontinued     3.375 g 12.5 mL/hr over 240 Minutes Intravenous Every 8 hours 11/24/17 1951 11/28/17 1602   11/24/17 1930  piperacillin-tazobactam (ZOSYN) IVPB 3.375 g     3.375 g 100 mL/hr over 30 Minutes Intravenous  Once 11/24/17 1924 11/24/17 2229   11/24/17 1330  cefTRIAXone (ROCEPHIN) 2 g in sodium chloride 0.9 % 100 mL IVPB     2 g 200 mL/hr over 30 Minutes Intravenous  Once 11/24/17 1327 11/24/17 1456   11/24/17 1330  metroNIDAZOLE (FLAGYL) IVPB 500 mg  500 mg 100 mL/hr over 60 Minutes Intravenous  Once 11/24/17 1327 11/24/17 1658       Assessment/Plan HTN HLD CAD s/p PCI 2018 Acute on chronic anemia -hgb 8.0  today DMT2, uncontrolled - A1C 7.5%, SSI CKD with acute renal failure - nephrology consulted,suspect ATN,Cr 2.53 today Chronic back pain  Acute appendicitis S/pLAPAROSCOPIC ABDOMINAL EXPLORATION, DRAINAGE OF APPENDICEAL ABCESS. PLACEMENT OF DRAIN7/11 Dr. Lucia Gaskins - POD6, afebrile, VSS, WBCdown to 13.5 -drain with18cc serosanguinous drainage recorded, continue JP drain - now denies flatus or BM, abdominal exam benign - mobilize more - never had a colonoscopy before  ID -zosyn 7/9>> FEN -IVF,soft diet, Ensure VTE -SCDs Foley -none  Plan: Encouraged more  OOB/ambulation and to work with PT/OT. Continue ensure. Added tramadol for PO pain control and encouraged wean off dilaudid    LOS: 8 days    Brigid Re , [facility] 12/02/2017, 11:51 AM Pager: [phone] Mon-Fri 7:00 am-4:30 pm Sat-Sun 7:00 am-11:30 am

## 2017-12-02 NOTE — Progress Notes (Signed)
OT Cancellation Note  Patient Details Name: David [name] MRN: [id number] DOB: 04/04/1946   Cancelled Treatment:    Reason Eval/Treat Not Completed: Patient declined, no reason specified. Second attempt to see pt today. First attempt pt reported he was resting (had recently received pain med). This attempt pt stating that he has already been up and down to the bathroom. Discussed pt's baseline activity level being higher than what he is currently doing and that return to baseline is the goal of therapy. Pt states he plans to walk in the hall later this afternoon. Discussed plans for next OT session.   Hortencia Pilar 12/02/2017, 1:29 PM

## 2017-12-02 NOTE — Progress Notes (Signed)
  Assessment/ Plan:  David [name] is a 72 yo M with history of CAD s/p PCO 03/2017 on DAPT, CHF, CDK stage II, T2DM and HTN who presented with sepsis secondary to appendicitis due to appendiceal abscess. He is s/p drain placement by general surgery. Surgery was complicated by peri-op hypotension leading to acute renal failure from HTN.   Acute renal failure 2/2 ATN on CKD 2: Urine microscopy c/w ATN. Renal US with no evidence of obstruction. Baseline Cr 1.4-1.6. Cr peaked at 5.91, it is at 2.53 today and UOP recorded is unreliable.  Renal will sign off as Hospital Medicine Team following.  Sepsis s/p drainage of appendiceal abscess   CAD: On ASA, holding Plavix.  (Ok to restart if intent was a potential renal procedure)  Hypokalemia: Supp   Subjective: Interval History: Overall better, diarrhea better  Objective: Vital signs in last 24 hours: Temp:  [98.7 F (37.1 C)] 98.7 F (37.1 C) (07/16 2031) Pulse Rate:  [64] 64 (07/17 1042) Resp:  [16] 16 (07/16 2031) BP: (158-170)/(72-77) 158/72 (07/17 1042) SpO2:  [96 %-98 %] 98 % (07/16 2031) Weight change:   Intake/Output from previous day: 07/16 0701 - 07/17 0700 In: 987 [P.O.:237; I.V.:700; IV Piggyback:50] Out: 318 [Urine:300; Drains:18] Intake/Output this shift: Total I/O In: -  Out: 300 [Urine:300]  General appearance: alert and cooperative Abd Protub Ext 1+ BLE edema  Lab Results: Recent Labs    11/30/17 0319 12/02/17 0446  WBC 13.5* 13.5*  HGB 8.7* 8.0*  HCT 28.1* 25.6*  PLT 313 318   BMET:  Recent Labs    12/01/17 1417 12/02/17 0446  NA 145 145  K 3.2* 3.0*  CL 111 112*  CO2 25 25  GLUCOSE 85 143*  BUN 33* 27*  CREATININE 3.07* 2.53*  CALCIUM 8.1* 7.9*   No results for input(s): PTH in the last 72 hours. Iron Studies: No results for input(s): IRON, TIBC, TRANSFERRIN, FERRITIN in the last 72 hours. Studies/Results: No results found.  Scheduled: . amLODipine  5 mg Oral Daily  . aspirin  81 mg Oral  Daily  . atorvastatin  40 mg Oral QHS  . dorzolamide-timolol  1 drop Both Eyes BID  . feeding supplement (ENSURE ENLIVE)  237 mL Oral BID BM  . fluticasone  1 spray Each Nare Daily  . insulin aspart  0-9 Units Subcutaneous TID WC  . insulin detemir  30 Units Subcutaneous Daily  . isosorbide-hydrALAZINE  1 tablet Oral TID  . metoprolol succinate  100 mg Oral Daily  . saccharomyces boulardii  250 mg Oral BID     LOS: 8 days   Estanislado Emms 12/02/2017,12:46 PM

## 2017-12-02 NOTE — Progress Notes (Signed)
Patient reports having abd "spasms" and having loose stools this evening.  Stool specimen sent to the lab

## 2017-12-02 NOTE — Progress Notes (Signed)
PROGRESS NOTE  David [name]  HDQ:[id number] DOB: 09/21/1945 DOA: 11/24/2017 PCP: [name], MD  Outpatient Specialists: Cardiology, Dr. [name] Brief Narrative: David [name] is a 72 y.o. male with a history of CAD s/p stenting Nov 2018 on DAPT, previous CHF with most recent echo normal, T2DM, stage II CKD, HTN who presented with periumbilical abdominal pain found to have appendicitis related to appendicolith on imaging. Also with AKI and hyperglycemia. Surgery consulted and cardiology saw patient to inform decisions regarding antiplatelet therapy. Plavix has been held. Sepsis worsened despite antibiotics and surgery performed drainage of appendiceal abscess with drain placement 7/11. There was some perioperative hypotension and evidence of acute renal failure with ATN for which nephrology was consulted. This has slowly improved. He has continued a slow recovery, very little motivation to mobilize and advance diet. PT has recommended SNF.  Assessment & Plan: Principal Problem:   Appendicitis Active Problems:   Hypertension   Diabetes mellitus ([place])   Status post coronary artery stent placement   Acute renal failure superimposed on stage 3 chronic kidney disease (HCC)  Sepsis due to acute appendiceal abscess: s/p drainage and drain placement 7/11 by Dr. Lucia Gaskins: Anticipate slow recovery given age.  - Diet per surgery.  - Leukocytosis stable today, has been improving. Will recheck in AM. Afebrile.  - Continue zosyn per surgery.   Acute renal failure on stage II CKD: Microscopy consistent with florid ATN. Renal U/S negative.  - Repeat labs show steady improvement. UOP not reliable. Nephrology signed off 7/17. - Avoid hypotension, nephrotoxins. - Renally dosing medications - Continue monitoring UOP, Cr.   Hypokalemia:  - Poor po continues. Will supplement.   Watery diarrhea: Fortunately no worsening of abd pain or fever, WBC improving. Diarrhea resolved. DC precautions and CDiff.    - Continue probiotics  CAD s/p PCI by Dr. [name] 04/06/2017: Maintained on DAPT. Appears euvolemic.  - Cardiology, Dr. [name], evaluated patient. Continue ASA, ok to hold plavix for surgery.  - Defer to surgery, when can we restart plavix? - Continue statin   Acute blood loss anemia on anemia of chronic disease: Hgb down 10.1 > 8.8 > 7.7 postop with stable platelet count. - Improved s/p 1u PRBCs 7/14. Continue monitoring and transfuse for hgb < 8 w/CAD. - Daily CBC. TRending down slowly, possibly an element of iatrogenic blood loss.   HTN:  - Continue metoprolol, place hold parameters on bidil (don't want to precipitate angina by holding) - Restart norvasc.  IDT2DM: HbA1c 7.5%. - Continue levemir (dose decreased) and sensitive SSI. At inpatient goal without hypoglycemia or much need for SSI. On D5 to prevent catabolism while advancing diet.  Left hip pain: Reported fall in bathroom a few days PTA with resultant left lateral thigh pain. Resolved.   DVT prophylaxis: SCDs Code Status: Full Family Communication: None at bedside Disposition Plan: SNF when stabilized.   Consultants:   Surgery  Cardiology  Nephrology  Procedures:  11/26/17 LAPAROSCOPIC ABDOMINAL EXPLORATION, DRAINAGE OF APPENDICEAL ABCESS. PLACEMENT OF Merri Brunette, MD   Antimicrobials:  Zosyn  Subjective: No more diarrhea since yesterday morning. Drank ensure and feels less weak, planning to ambulate today but has declined therapy evaluations again. Full tray of food on his plate. Denies abdominal pain except at site of drain.   Objective: Vitals:   12/01/17 2031 12/02/17 0408 12/02/17 1042 12/02/17 1354  BP: (!) 158/72 (!) 171/80 (!) 158/72 (!) 157/74  Pulse: 64 66 64 72  Resp: 16 18  16  Temp: 98.7 F (37.1 C) 98.3 F (36.8 C)  98.7 F (37.1 C)  TempSrc: Oral Oral  Oral  SpO2: 98% 100%  98%  Weight:      Height:        Intake/Output Summary (Last 24 hours) at 12/02/2017 1425 Last data  filed at 12/02/2017 2355 Gross per 24 hour  Intake 987 ml  Output 318 ml  Net 669 ml   Filed Weights   11/24/17 1004 11/26/17 1507  Weight: 99.8 kg (220 lb) 99.8 kg (220 lb)   Gen: 72 y.o. male in no distress Pulm: Nonlabored breathing room air. Clear. CV: Regular rate and rhythm. No murmur, rub, or gallop. No JVD, no dependent edema. GI: Abdomen soft, tender in RLQ without significant surrounding erythema, dressings c/d/i. Serosanguinous output in JP. Non-distended, with normoactive bowel sounds.  Ext: Warm, no deformities Skin: No rashes, lesions or ulcers on visualized skin.  Neuro: Alert and oriented. No focal neurological deficits. Psych: Judgement and insight appear fair. Mood euthymic & affect congruent. Behavior is appropriate.    CBC: Recent Labs  Lab 11/26/17 0334 11/27/17 0525 11/28/17 0340 11/29/17 0711 11/30/17 0319 12/02/17 0446  WBC 24.6* 17.5* 17.4* 14.0* 13.5* 13.5*  NEUTROABS 21.2*  --   --   --   --   --   HGB 10.1* 8.8* 8.9* 7.7* 8.7* 8.0*  HCT 31.7* 28.8* 29.7* 24.9* 28.1* 25.6*  MCV 104.6* 107.1* 108.4* 106.9* 104.1* 103.2*  PLT 301 302 316 298 313 732   Basic Metabolic Panel: Recent Labs  Lab 11/28/17 0340 11/29/17 0711 11/30/17 0319 12/01/17 1417 12/02/17 0446  NA 136 137 141 145 145  K 3.7 3.2* 3.2* 3.2* 3.0*  CL 103 105 108 111 112*  CO2 22 22 23 25 25   GLUCOSE 126* 98 154* 85 143*  BUN 50* 52* 49* 33* 27*  CREATININE 4.61* 5.91* 5.38* 3.07* 2.53*  CALCIUM 7.9* 7.5* 7.5* 8.1* 7.9*  MG  --   --  2.2  --   --    GFR: Estimated Creatinine Clearance: 34.3 mL/min (A) (by C-G formula based on SCr of 2.53 mg/dL (H)). Liver Function Tests: No results for input(s): AST, ALT, ALKPHOS, BILITOT, PROT, ALBUMIN in the last 168 hours. No results for input(s): LIPASE, AMYLASE in the last 168 hours. No results for input(s): AMMONIA in the last 168 hours. Coagulation Profile: No results for input(s): INR, PROTIME in the last 168 hours. Cardiac  Enzymes: No results for input(s): CKTOTAL, CKMB, CKMBINDEX, TROPONINI in the last 168 hours. BNP (last 3 results) No results for input(s): PROBNP in the last 8760 hours. HbA1C: No results for input(s): HGBA1C in the last 72 hours. CBG: Recent Labs  Lab 12/01/17 1629 12/01/17 2052 12/02/17 0100 12/02/17 0413 12/02/17 1139  GLUCAP 131* 150* 133* 136* 165*   Lipid Profile: No results for input(s): CHOL, HDL, LDLCALC, TRIG, CHOLHDL, LDLDIRECT in the last 72 hours. Thyroid Function Tests: No results for input(s): TSH, T4TOTAL, FREET4, T3FREE, THYROIDAB in the last 72 hours. Anemia Panel: No results for input(s): VITAMINB12, FOLATE, FERRITIN, TIBC, IRON, RETICCTPCT in the last 72 hours. Urine analysis:    Component Value Date/Time   COLORURINE AMBER (A) 11/27/2017 1313   APPEARANCEUR CLOUDY (A) 11/27/2017 1313   LABSPEC 1.021 11/27/2017 1313   PHURINE 5.0 11/27/2017 1313   GLUCOSEU NEGATIVE 11/27/2017 1313   HGBUR MODERATE (A) 11/27/2017 1313   BILIRUBINUR NEGATIVE 11/27/2017 1313   KETONESUR NEGATIVE 11/27/2017 1313   PROTEINUR 100 (A) 11/27/2017  1313   UROBILINOGEN 1.0 02/13/2015 1934   NITRITE NEGATIVE 11/27/2017 1313   LEUKOCYTESUR NEGATIVE 11/27/2017 1313   Recent Results (from the past 240 hour(s))  Surgical pcr screen     Status: None   Collection Time: 11/25/17  9:21 PM  Result Value Ref Range Status   MRSA, PCR NEGATIVE NEGATIVE Final   Staphylococcus aureus NEGATIVE NEGATIVE Final    Comment: (NOTE) The Xpert SA Assay (FDA approved for NASAL specimens in patients 36 years of age and older), is one component of a comprehensive surveillance program. It is not intended to diagnose infection nor to guide or monitor treatment. Performed at [facility], [place] [address] [place], [place] [address]   Culture, Urine     Status: None   Collection Time: 11/28/17 10:30 AM  Result Value Ref Range Status   Specimen Description URINE, CLEAN CATCH  Final   Special  Requests NONE  Final   Culture   Final    NO GROWTH Performed at [facility], [place] [address] [place], [place] [address]    Report Status 11/29/2017 FINAL  Final      Radiology Studies: No results found.  Scheduled Meds: . amLODipine  5 mg Oral Daily  . aspirin  81 mg Oral Daily  . atorvastatin  40 mg Oral QHS  . dorzolamide-timolol  1 drop Both Eyes BID  . feeding supplement (ENSURE ENLIVE)  237 mL Oral BID BM  . fluticasone  1 spray Each Nare Daily  . insulin aspart  0-9 Units Subcutaneous TID WC  . insulin detemir  30 Units Subcutaneous Daily  . isosorbide-hydrALAZINE  1 tablet Oral TID  . metoprolol succinate  100 mg Oral Daily  . potassium chloride  20 mEq Oral Once  . saccharomyces boulardii  250 mg Oral BID   Continuous Infusions: . sodium chloride 10 mL/hr at 11/26/17 1509  . dextrose 5 % and 0.9% NaCl 100 mL/hr at 12/02/17 1220  . piperacillin-tazobactam (ZOSYN)  IV 3.375 g (12/02/17 1133)     LOS: 8 days   Time spent: 25 minutes.  Patrecia Pour, MD [facility] [email or web address] Password [email or web address] 12/02/2017, 2:25 PM

## 2017-12-02 NOTE — Progress Notes (Signed)
Physical Therapy Treatment Patient Details Name: David [name] MRN: [id number] DOB: 1945/07/21 Today's Date: 12/02/2017    History of Present Illness Pt is a 72 y/o male admitted secondary to worsening abdominal pain and constipation. Pt is s/p laparascopic abdominal exploration and drainage of appendiceal abscess with placement of drain on 7/11. PMH includes HTN, DM, CAD s/p stent placement, CHF, R THA, CKD, and back surgery per pt report.     PT Comments    Pt remains very limited secondary to abdominal pain and fatigue. He continues to demonstrate poor safety awareness and modest instability with functional movement requiring assistance. Pt would continue to benefit from skilled physical therapy services at this time while admitted and after d/c to address the below listed limitations in order to improve overall safety and independence with functional mobility.    Follow Up Recommendations  SNF;Supervision/Assistance - 24 hour     Equipment Recommendations  3in1 (PT)    Recommendations for Other Services       Precautions / Restrictions Precautions Precautions: Fall Precaution Comments: JP drain  Restrictions Weight Bearing Restrictions: No    Mobility  Bed Mobility               General bed mobility comments: pt OOB on toilet with nurse tech and wife present upon arrival  Transfers Overall transfer level: Needs assistance Equipment used: Rolling walker (2 wheeled) Transfers: Sit to/from Stand Sit to Stand: Min assist         General transfer comment: increased time and effort, assist for stability with transitional movement from toilet x2  Ambulation/Gait Ambulation/Gait assistance: Min assist;Min guard Gait Distance (Feet): 20 Feet Assistive device: Rolling walker (2 wheeled) Gait Pattern/deviations: Step-through pattern;Decreased stride length;Drifts right/left;Trunk flexed Gait velocity: Decreased  Gait velocity interpretation: <1.31 ft/sec,  indicative of household ambulator General Gait Details: pt very slow, cautious but unsteady gait with poor safety awareness; pt requiring constant close min guard with occasional min A for stability and to maintain balance   Stairs             Wheelchair Mobility    Modified Rankin (Stroke Patients Only)       Balance Overall balance assessment: Needs assistance Sitting-balance support: No upper extremity supported;Feet supported Sitting balance-Leahy Scale: Fair     Standing balance support: Bilateral upper extremity supported;During functional activity Standing balance-Leahy Scale: Poor                              Cognition Arousal/Alertness: Awake/alert Behavior During Therapy: Flat affect Overall Cognitive Status: Impaired/Different from baseline Area of Impairment: Safety/judgement;Problem solving                         Safety/Judgement: Decreased awareness of deficits;Decreased awareness of safety   Problem Solving: Difficulty sequencing;Requires verbal cues        Exercises      General Comments        Pertinent Vitals/Pain Pain Assessment: Faces Faces Pain Scale: Hurts whole lot Pain Location: abdominal pain  Pain Descriptors / Indicators: Aching;Operative site guarding Pain Intervention(s): Monitored during session;Repositioned    Home Living                      Prior Function            PT Goals (current goals can now be found in the care plan section) Acute Rehab  PT Goals PT Goal Formulation: With patient Time For Goal Achievement: 12/11/17 Potential to Achieve Goals: Fair Progress towards PT goals: Progressing toward goals    Frequency    Min 2X/week      PT Plan Current plan remains appropriate    Co-evaluation              AM-PAC PT "6 Clicks" Daily Activity  Outcome Measure  Difficulty turning over in bed (including adjusting bedclothes, sheets and blankets)?: A  Little Difficulty moving from lying on back to sitting on the side of the bed? : A Little Difficulty sitting down on and standing up from a chair with arms (e.g., wheelchair, bedside commode, etc,.)?: Unable Help needed moving to and from a bed to chair (including a wheelchair)?: A Little Help needed walking in hospital room?: A Lot Help needed climbing 3-5 steps with a railing? : Total 6 Click Score: 13    End of Session   Activity Tolerance: Patient limited by pain;Patient limited by fatigue Patient left: in chair;with call bell/phone within reach;with chair alarm set;with family/visitor present Nurse Communication: Mobility status PT Visit Diagnosis: Other abnormalities of gait and mobility (R26.89);Unsteadiness on feet (R26.81);Muscle weakness (generalized) (M62.81);Pain Pain - part of body: (abdomen)     Time: 9458-5929 PT Time Calculation (min) (ACUTE ONLY): 28 min  Charges:  $Therapeutic Activity: 23-37 mins                    G Codes:       [place], PT, DPT [place] 12/02/2017, 5:27 PM

## 2017-12-03 ENCOUNTER — Inpatient Hospital Stay (HOSPITAL_COMMUNITY): Payer: Medicare Other

## 2017-12-03 LAB — CBC
HEMATOCRIT: 26.6 % — AB (ref 39.0–52.0)
HEMOGLOBIN: 8.3 g/dL — AB (ref 13.0–17.0)
MCH: 32.5 pg (ref 26.0–34.0)
MCHC: 31.2 g/dL (ref 30.0–36.0)
MCV: 104.3 fL — ABNORMAL HIGH (ref 78.0–100.0)
Platelets: 348 10*3/uL (ref 150–400)
RBC: 2.55 MIL/uL — AB (ref 4.22–5.81)
RDW: 12.5 % (ref 11.5–15.5)
WBC: 12.5 10*3/uL — ABNORMAL HIGH (ref 4.0–10.5)

## 2017-12-03 LAB — BASIC METABOLIC PANEL
ANION GAP: 9 (ref 5–15)
BUN: 19 mg/dL (ref 8–23)
CALCIUM: 8.1 mg/dL — AB (ref 8.9–10.3)
CO2: 25 mmol/L (ref 22–32)
Chloride: 114 mmol/L — ABNORMAL HIGH (ref 98–111)
Creatinine, Ser: 1.96 mg/dL — ABNORMAL HIGH (ref 0.61–1.24)
GFR calc non Af Amer: 33 mL/min — ABNORMAL LOW (ref >60)
GFR, EST AFRICAN AMERICAN: 38 mL/min — AB (ref >60)
GLUCOSE: 141 mg/dL — AB (ref 70–99)
POTASSIUM: 3.3 mmol/L — AB (ref 3.5–5.1)
SODIUM: 148 mmol/L — AB (ref 135–145)

## 2017-12-03 LAB — GLUCOSE, CAPILLARY
GLUCOSE-CAPILLARY: 116 mg/dL — AB (ref 70–99)
GLUCOSE-CAPILLARY: 127 mg/dL — AB (ref 70–99)
GLUCOSE-CAPILLARY: 60 mg/dL — AB (ref 70–99)
GLUCOSE-CAPILLARY: 76 mg/dL (ref 70–99)
Glucose-Capillary: 113 mg/dL — ABNORMAL HIGH (ref 70–99)
Glucose-Capillary: 94 mg/dL (ref 70–99)

## 2017-12-03 MED ORDER — BOOST / RESOURCE BREEZE PO LIQD CUSTOM: 1.0000 | Freq: Three times a day (TID) | ORAL | Status: DC

## 2017-12-03 MED ORDER — POTASSIUM CHLORIDE CRYS ER 10 MEQ PO TBCR
20.0000 meq | EXTENDED_RELEASE_TABLET | Freq: Once | ORAL | Status: DC
  Filled 2017-12-03: qty 2

## 2017-12-03 MED ORDER — IOPAMIDOL (ISOVUE-300) INJECTION 61%
INTRAVENOUS | Status: AC
  Filled 2017-12-03: qty 30

## 2017-12-03 NOTE — Progress Notes (Signed)
PROGRESS NOTE  David [name]  HCW:[id number] DOB: 07/24/1945 DOA: 11/24/2017 PCP: [name], MD  Outpatient Specialists: Cardiology, Dr. [name] Brief Narrative: David [name] is a 72 y.o. male with a history of CAD s/p stenting Nov 2018 on DAPT, previous CHF with most recent echo normal, T2DM, stage II CKD, HTN who presented with periumbilical abdominal pain found to have appendicitis related to appendicolith on imaging. Also with AKI and hyperglycemia. Surgery consulted and cardiology saw patient to inform decisions regarding antiplatelet therapy. Plavix has been held. Sepsis worsened despite antibiotics and surgery performed drainage of appendiceal abscess with drain placement 7/11. There was some perioperative hypotension and evidence of acute renal failure with ATN for which nephrology was consulted. This has slowly improved. He has continued a slow recovery, very little motivation to mobilize and advance diet. PT has recommended SNF. Though renal function is improving, he continues to require IV fluids support due to not taking much po.  Assessment & Plan: Principal Problem:   Appendicitis Active Problems:   Hypertension   Diabetes mellitus ([place])   Status post coronary artery stent placement   Acute renal failure superimposed on stage 3 chronic kidney disease (HCC)  Sepsis due to acute appendiceal abscess: s/p drainage and drain placement 7/11 by Dr. Lucia Gaskins: Anticipate slow recovery given age.  - Diet per surgery.  - Leukocytosis with slow steady improvement. Afebrile. Continue zosyn per surgery.  - Continue drain per surgery.  Acute renal failure on stage II CKD: Microscopy consistent with florid ATN. Renal U/S negative.  - Continuing to improve with IV fluids. UOP not reliable. Nephrology signed off 7/17. - Avoid hypotension, nephrotoxins. - Renally dosing medications - Continue monitoring UOP, Cr.   Hypokalemia:  - Poor po continues. Will supplement again today,  improving.    Hypernatremia: Mild.  - Continue hypotonic fluids and monitor.  Watery diarrhea: Fortunately no worsening of abd pain or fever, WBC improving. Diarrhea resolved. Had loose but not watery BM 7/18. - Continue probiotics  CAD s/p PCI by Dr. [name] 04/06/2017: Maintained on DAPT. Appears euvolemic.  - Cardiology, Dr. [name], evaluated patient. Continue ASA, ok to hold plavix for surgery.  - Defer to surgery, when can we restart plavix? - Continue statin   Acute blood loss anemia on anemia of chronic disease:  - Improved s/p 1u PRBCs 7/14. Stable today. Continue monitoring and transfuse for hgb < 8 w/CAD.  HTN:  - Continue metoprolol, bidil, norvasc  IDT2DM: HbA1c 7.5%. - Continue levemir (dose decreased) and sensitive SSI. At inpatient goal without hypoglycemia or much need for SSI. On D5 to prevent catabolism while advancing diet.  Left hip pain: Reported fall in bathroom a few days PTA with resultant left lateral thigh pain. Resolved.   DVT prophylaxis: SCDs Code Status: Full Family Communication: None at bedside Disposition Plan: Very slow recovery, still eating nearly nothing. May need continued IV fluids. SNF when stabilized.   Consultants:   Surgery  Cardiology  Nephrology  Procedures:  11/26/17 LAPAROSCOPIC ABDOMINAL EXPLORATION, DRAINAGE OF APPENDICEAL ABCESS. PLACEMENT OF Merri Brunette, MD   Antimicrobials:  Zosyn  Subjective: No more diarrhea since yesterday morning. Drank ensure and feels less weak, planning to ambulate today but has declined therapy evaluations again. Full tray of food on his plate. Denies abdominal pain except at site of drain.   Objective: Vitals:   12/02/17 1042 12/02/17 1354 12/02/17 1949 12/03/17 0551  BP: (!) 158/72 (!) 157/74 (!) 158/74 (!) 168/82  Pulse: 64 72 66  69  Resp:  16 18 17   Temp:  98.7 F (37.1 C) 97.7 F (36.5 C) 98.4 F (36.9 C)  TempSrc:  Oral Oral Oral  SpO2:  98% 100% 100%  Weight:        Height:        Intake/Output Summary (Last 24 hours) at 12/03/2017 0935 Last data filed at 12/03/2017 0715 Gross per 24 hour  Intake 779.63 ml  Output 885 ml  Net -105.37 ml   Filed Weights   11/24/17 1004 11/26/17 1507  Weight: 99.8 kg (220 lb) 99.8 kg (220 lb)   Gen: 72 y.o. male in no distress Pulm: Nonlabored breathing room air. Clear. CV: Regular rate and rhythm. No murmur, rub, or gallop. No JVD, no dependent edema. GI: Abdomen soft, tender in RLQ without significant surrounding erythema, dressings c/d/i. Serosanguinous output in JP. Non-distended, with normoactive bowel sounds.  Ext: Warm, no deformities Skin: No rashes, lesions or ulcers on visualized skin.  Neuro: Alert and oriented. No focal neurological deficits. Psych: Judgement and insight appear fair. Mood euthymic & affect congruent. Behavior is appropriate.    CBC: Recent Labs  Lab 11/28/17 0340 11/29/17 0711 11/30/17 0319 12/02/17 0446 12/03/17 0450  WBC 17.4* 14.0* 13.5* 13.5* 12.5*  HGB 8.9* 7.7* 8.7* 8.0* 8.3*  HCT 29.7* 24.9* 28.1* 25.6* 26.6*  MCV 108.4* 106.9* 104.1* 103.2* 104.3*  PLT 316 298 313 318 355   Basic Metabolic Panel: Recent Labs  Lab 11/29/17 0711 11/30/17 0319 12/01/17 1417 12/02/17 0446 12/03/17 0450  NA 137 141 145 145 148*  K 3.2* 3.2* 3.2* 3.0* 3.3*  CL 105 108 111 112* 114*  CO2 22 23 25 25 25   GLUCOSE 98 154* 85 143* 141*  BUN 52* 49* 33* 27* 19  CREATININE 5.91* 5.38* 3.07* 2.53* 1.96*  CALCIUM 7.5* 7.5* 8.1* 7.9* 8.1*  MG  --  2.2  --   --   --    GFR: Estimated Creatinine Clearance: 44.2 mL/min (A) (by C-G formula based on SCr of 1.96 mg/dL (H)). Liver Function Tests: No results for input(s): AST, ALT, ALKPHOS, BILITOT, PROT, ALBUMIN in the last 168 hours. No results for input(s): LIPASE, AMYLASE in the last 168 hours. No results for input(s): AMMONIA in the last 168 hours. Coagulation Profile: No results for input(s): INR, PROTIME in the last 168  hours. Cardiac Enzymes: No results for input(s): CKTOTAL, CKMB, CKMBINDEX, TROPONINI in the last 168 hours. BNP (last 3 results) No results for input(s): PROBNP in the last 8760 hours. HbA1C: No results for input(s): HGBA1C in the last 72 hours. CBG: Recent Labs  Lab 12/02/17 0413 12/02/17 1139 12/02/17 1626 12/02/17 2152 12/03/17 0641  GLUCAP 136* 165* 252* 229* 116*   Lipid Profile: No results for input(s): CHOL, HDL, LDLCALC, TRIG, CHOLHDL, LDLDIRECT in the last 72 hours. Thyroid Function Tests: No results for input(s): TSH, T4TOTAL, FREET4, T3FREE, THYROIDAB in the last 72 hours. Anemia Panel: No results for input(s): VITAMINB12, FOLATE, FERRITIN, TIBC, IRON, RETICCTPCT in the last 72 hours. Urine analysis:    Component Value Date/Time   COLORURINE AMBER (A) 11/27/2017 1313   APPEARANCEUR CLOUDY (A) 11/27/2017 1313   LABSPEC 1.021 11/27/2017 1313   PHURINE 5.0 11/27/2017 1313   GLUCOSEU NEGATIVE 11/27/2017 1313   HGBUR MODERATE (A) 11/27/2017 1313   BILIRUBINUR NEGATIVE 11/27/2017 1313   KETONESUR NEGATIVE 11/27/2017 1313   PROTEINUR 100 (A) 11/27/2017 1313   UROBILINOGEN 1.0 02/13/2015 1934   NITRITE NEGATIVE 11/27/2017 1313   LEUKOCYTESUR  NEGATIVE 11/27/2017 1313   Recent Results (from the past 240 hour(s))  Surgical pcr screen     Status: None   Collection Time: 11/25/17  9:21 PM  Result Value Ref Range Status   MRSA, PCR NEGATIVE NEGATIVE Final   Staphylococcus aureus NEGATIVE NEGATIVE Final    Comment: (NOTE) The Xpert SA Assay (FDA approved for NASAL specimens in patients 66 years of age and older), is one component of a comprehensive surveillance program. It is not intended to diagnose infection nor to guide or monitor treatment. Performed at [facility], [place] [address] [place], [place] [address]   Culture, Urine     Status: None   Collection Time: 11/28/17 10:30 AM  Result Value Ref Range Status   Specimen Description URINE, CLEAN CATCH   Final   Special Requests NONE  Final   Culture   Final    NO GROWTH Performed at [facility], [place] [address], [place],  [address]    Report Status 11/29/2017 FINAL  Final      Radiology Studies: No results found.  Scheduled Meds: . amLODipine  5 mg Oral Daily  . aspirin  81 mg Oral Daily  . atorvastatin  40 mg Oral QHS  . dorzolamide-timolol  1 drop Both Eyes BID  . feeding supplement (ENSURE ENLIVE)  237 mL Oral BID BM  . fluticasone  1 spray Each Nare Daily  . insulin aspart  0-9 Units Subcutaneous TID WC  . insulin detemir  30 Units Subcutaneous Daily  . isosorbide-hydrALAZINE  1 tablet Oral TID  . metoprolol succinate  100 mg Oral Daily  . saccharomyces boulardii  250 mg Oral BID   Continuous Infusions: . sodium chloride 10 mL/hr at 11/26/17 1509  . dextrose 5 % and 0.45% NaCl    . piperacillin-tazobactam (ZOSYN)  IV 3.375 g (12/03/17 0402)     LOS: 9 days   Time spent: 25 minutes.  Patrecia Pour, MD [facility] [email or web address] Password [email or web address] 12/03/2017, 9:35 AM

## 2017-12-03 NOTE — Plan of Care (Signed)
  Problem: Coping: Goal: Level of anxiety will decrease Outcome: Progressing   Problem: Pain Managment: Goal: General experience of comfort will improve Outcome: Progressing   

## 2017-12-03 NOTE — Progress Notes (Addendum)
Initial Nutrition Assessment  DOCUMENTATION CODES:   Not applicable  INTERVENTION:    Boost Breeze po TID, each supplement provides 250 kcal and 9 grams of protein   D/C Ensure Enlive  NUTRITION DIAGNOSIS:   Inadequate oral intake related to poor appetite, diarrhea as evidenced by per patient/family report, meal completion < 25%  GOAL:   Patient will meet greater than or equal to 90% of their needs  MONITOR:   PO intake, Supplement acceptance, Labs, Weight trends, Skin, I & O's  REASON FOR ASSESSMENT:   Malnutrition Screening Tool  ASSESSMENT:   72 y.o. Male with a history of CAD s/p stenting Nov 2018 on DAPT, previous CHF with most recent echo normal, T2DM, stage II CKD, HTN who presented with periumbilical abdominal pain.   Pt admitted for Preop cardiovascular exam [Z01.810] Renal insufficiency [N28.9] Acute appendicitis, unspecified acute appendicitis type [K35.80]   Pt s/p procedures 7/11: LAPAROSCOPIC ABDOMINAL EXPLORATION,  DRAINAGE OF APPENDICEAL ABCESS PLACEMENT OF DRAIN  RD spoke with patient at bedside. He doesn't feel well. Pt reports he's had a poor appetite for a couple of weeks.   He states "everything I eat gets jumbled up and I have diarrhea". Pt doesn't not know if he's lost weight or not. His UBW is 220 lbs.  He tried an [employer] but it "did the same". Medications include Florastor (probiotic).   Labs reviewed. Na 148 (H). K 3.3 (L). CBG's 229-116-127.  NUTRITION - FOCUSED PHYSICAL EXAM:  Completed. No muscle or fat depletions noticed.  Diet Order:   Diet Order           DIET SOFT Room service appropriate? Yes; Fluid consistency: Thin  Diet effective now         EDUCATION NEEDS:   No education needs have been identified at this time  Skin:  Skin Assessment: Reviewed RN Assessment  Last BM:  7/18  Height:   Ht Readings from Last 1 Encounters:  11/26/17 6' 2.02" (1.88 m)   Weight:   Wt Readings from Last  1 Encounters:  11/26/17 220 lb (99.8 kg)   BMI:  Body mass index is 28.23 kg/m.  Estimated Nutritional Needs:   Kcal:  2000-2200  Protein:  100-115 gm  Fluid:  2.0-2.2 L  Arthur Holms, RD, LDN Pager #: [phone] After-Hours Pager #: [phone]

## 2017-12-03 NOTE — Progress Notes (Signed)
Patient ID: David [name], male   DOB: [birth year], 72 y.o.   MRN: [id number]    7 Days Post-Op  Subjective: Pt just doesn't feel well today.  Complains of pain all over, but localizing to the RLQ.  Complains of pain to right shoulder blade, and down in his pelvis, etc.  Still with diarrhea.  Not eating much.  Objective: Vital signs in last 24 hours: Temp:  [97.7 F (36.5 C)-98.7 F (37.1 C)] 98.4 F (36.9 C) (07/18 0551) Pulse Rate:  [64-72] 69 (07/18 0551) Resp:  [16-18] 17 (07/18 0551) BP: (157-168)/(72-82) 168/82 (07/18 0551) SpO2:  [98 %-100 %] 100 % (07/18 0551) Last BM Date: 12/02/17  Intake/Output from previous day: 07/17 0701 - 07/18 0700 In: 779.6 [P.O.:474; IV Piggyback:205.6] Out: 905 [Urine:875; Drains:30] Intake/Output this shift: Total I/O In: -  Out: 280 [Urine:280]  PE: Heart: regular Lungs: CTAB Abd: soft, mild diffuse tenderness, greatest is in RLQ.  Drain with serous output and not much.  Incisions are c/d/i  Lab Results:  Recent Labs    12/02/17 0446 12/03/17 0450  WBC 13.5* 12.5*  HGB 8.0* 8.3*  HCT 25.6* 26.6*  PLT 318 348   BMET Recent Labs    12/02/17 0446 12/03/17 0450  NA 145 148*  K 3.0* 3.3*  CL 112* 114*  CO2 25 25  GLUCOSE 143* 141*  BUN 27* 19  CREATININE 2.53* 1.96*  CALCIUM 7.9* 8.1*   PT/INR No results for input(s): LABPROT, INR in the last 72 hours. CMP     Component Value Date/Time   NA 148 (H) 12/03/2017 0450   K 3.3 (L) 12/03/2017 0450   CL 114 (H) 12/03/2017 0450   CO2 25 12/03/2017 0450   GLUCOSE 141 (H) 12/03/2017 0450   BUN 19 12/03/2017 0450   CREATININE 1.96 (H) 12/03/2017 0450   CALCIUM 8.1 (L) 12/03/2017 0450   PROT 6.9 11/24/2017 1015   ALBUMIN 2.7 (L) 11/24/2017 1015   AST 39 11/24/2017 1015   ALT 33 11/24/2017 1015   ALKPHOS 94 11/24/2017 1015   BILITOT 0.7 11/24/2017 1015   GFRNONAA 33 (L) 12/03/2017 0450   GFRAA 38 (L) 12/03/2017 0450   Lipase     Component Value Date/Time   LIPASE  24 11/24/2017 1015       Studies/Results: No results found.  Anti-infectives: Anti-infectives (From admission, onward)   Start     Dose/Rate Route Frequency Ordered Stop   12/02/17 1100  piperacillin-tazobactam (ZOSYN) IVPB 3.375 g     3.375 g 12.5 mL/hr over 240 Minutes Intravenous Every 8 hours 12/02/17 0959     11/28/17 1900  piperacillin-tazobactam (ZOSYN) IVPB 2.25 g  Status:  Discontinued     2.25 g 100 mL/hr over 30 Minutes Intravenous Every 8 hours 11/28/17 1602 12/02/17 0959   11/25/17 0300  piperacillin-tazobactam (ZOSYN) IVPB 3.375 g  Status:  Discontinued     3.375 g 12.5 mL/hr over 240 Minutes Intravenous Every 8 hours 11/24/17 1951 11/28/17 1602   11/24/17 1930  piperacillin-tazobactam (ZOSYN) IVPB 3.375 g     3.375 g 100 mL/hr over 30 Minutes Intravenous  Once 11/24/17 1924 11/24/17 2229   11/24/17 1330  cefTRIAXone (ROCEPHIN) 2 g in sodium chloride 0.9 % 100 mL IVPB     2 g 200 mL/hr over 30 Minutes Intravenous  Once 11/24/17 1327 11/24/17 1456   11/24/17 1330  metroNIDAZOLE (FLAGYL) IVPB 500 mg     500 mg 100 mL/hr over 60 Minutes Intravenous  Once 11/24/17 1327 11/24/17 1658       Assessment/Plan HTN HLD CAD s/p PCI 2018 Acute on chronic anemia -hgb8.0  today DMT2, uncontrolled - A1C 7.5%, SSI CKD with acute renal failure - nephrology consulted,suspect ATN,Cr2.53 today Chronic back pain  Acute appendicitis S/pLAPAROSCOPIC ABDOMINAL EXPLORATION, DRAINAGE OF APPENDICEAL ABCESS. PLACEMENT OF DRAIN7/11 Dr. Lucia Gaskins - POD7, afebrile, VSS, WBCdown to 12.5 -drain with30cc serosanguinous drainagerecorded, was going to DC drain, but will wait until after scan. - cont probiotics given persistent diarrhea.  C diff negative. -given persistent pain and decrease appetite and not much progression, will order a CT scan today to rule out some source for these issues. - never had a colonoscopy before  ID -zosyn 7/9>> FEN -IVF,soft diet, Ensure VTE  -SCDs Foley -none  Plan: Encouraged more OOB/ambulation and to work with PT/OT. Continue ensure. Added tramadol for PO pain control and encouraged wean off dilaudid.  CT scan today.     LOS: 9 days    Henreitta Cea , [facility] 12/03/2017, 9:47 AM Pager: [phone]

## 2017-12-04 LAB — CBC
HCT: 28.7 % — ABNORMAL LOW (ref 39.0–52.0)
Hemoglobin: 9 g/dL — ABNORMAL LOW (ref 13.0–17.0)
MCH: 32.7 pg (ref 26.0–34.0)
MCHC: 31.4 g/dL (ref 30.0–36.0)
MCV: 104.4 fL — AB (ref 78.0–100.0)
PLATELETS: 380 10*3/uL (ref 150–400)
RBC: 2.75 MIL/uL — ABNORMAL LOW (ref 4.22–5.81)
RDW: 12.7 % (ref 11.5–15.5)
WBC: 13.2 10*3/uL — ABNORMAL HIGH (ref 4.0–10.5)

## 2017-12-04 LAB — BASIC METABOLIC PANEL
Anion gap: 8 (ref 5–15)
BUN: 10 mg/dL (ref 8–23)
CALCIUM: 8.4 mg/dL — AB (ref 8.9–10.3)
CHLORIDE: 111 mmol/L (ref 98–111)
CO2: 27 mmol/L (ref 22–32)
CREATININE: 1.74 mg/dL — AB (ref 0.61–1.24)
GFR calc Af Amer: 44 mL/min — ABNORMAL LOW (ref >60)
GFR calc non Af Amer: 38 mL/min — ABNORMAL LOW (ref >60)
GLUCOSE: 116 mg/dL — AB (ref 70–99)
Potassium: 3.1 mmol/L — ABNORMAL LOW (ref 3.5–5.1)
Sodium: 146 mmol/L — ABNORMAL HIGH (ref 135–145)

## 2017-12-04 LAB — GLUCOSE, CAPILLARY
Glucose-Capillary: 98 mg/dL (ref 70–99)
Glucose-Capillary: 112 mg/dL — ABNORMAL HIGH (ref 70–99)
Glucose-Capillary: 103 mg/dL — ABNORMAL HIGH (ref 70–99)
Glucose-Capillary: 106 mg/dL — ABNORMAL HIGH (ref 70–99)

## 2017-12-04 NOTE — Progress Notes (Signed)
In report RN reported that patient is refusing to drink the contrast. I asked patient reason for refusing he stated that it makes him throw up. I asked him if he attempted to drink it hs stated no because he has congested heart failure and back stenois. I explained this is oral so that MD can evaluate his condition. He still refused to drink. His wife attempted to convince that this would not effect his heart. He still refused. Arthor Captain LPN

## 2017-12-04 NOTE — Progress Notes (Signed)
Pt stated that he was feeling very neaouceous and threw up a little bit and said it was green, RN gave pt zofran and some crackers and ginger ale. Will notify MD and continue to monitor

## 2017-12-04 NOTE — Progress Notes (Signed)
Physical Therapy Treatment Patient Details Name: David [name] MRN: [id number] DOB: 1945-10-26 Today's Date: 12/04/2017    History of Present Illness Pt is a 72 y/o male admitted secondary to worsening abdominal pain and constipation. Pt is s/p laparascopic abdominal exploration and drainage of appendiceal abscess with placement of drain on 7/11. PMH includes HTN, DM, CAD s/p stent placement, CHF, R THA, CKD, and back surgery per pt report.     PT Comments    Pt performed gait training after attempting to toilet with no success.  Pt continues to present with decreased strength and requires minimal assistance for all mobility at this time.  Pt continues to benefit from skilled rehab at SNF short term to improve strength and function.  Pt educated on the importance of mobility and educated to ambulate with nursing staff this weekend.     Follow Up Recommendations  SNF;Supervision/Assistance - 24 hour     Equipment Recommendations  3in1 (PT)    Recommendations for Other Services OT consult     Precautions / Restrictions Precautions Precautions: Fall Precaution Comments: JP drain  Restrictions Weight Bearing Restrictions: No    Mobility  Bed Mobility Overal bed mobility: Needs Assistance             General bed mobility comments: pt OOB on toilet with nurse tech and wife present upon arrival, to return to bed patient required cues for log rolling and assistance to position once in bed (min assist).  Pt placing pressure on drain site to reduce pain when lowering trunk.    Transfers Overall transfer level: Needs assistance Equipment used: Rolling walker (2 wheeled) Transfers: Sit to/from Stand Sit to Stand: Min assist         General transfer comment: Cues for hand placement to stand from toilet.  In standing patient required assistance to pull up his brief.    Ambulation/Gait Ambulation/Gait assistance: Min assist;Min guard Gait Distance (Feet): 20 Feet Assistive  device: Rolling walker (2 wheeled) Gait Pattern/deviations: Step-through pattern;Decreased stride length;Drifts right/left;Trunk flexed Gait velocity: Decreased    General Gait Details: pt very slow, cautious but unsteady gait with poor safety awareness; pt requiring constant close min guard with occasional min A for stability and to maintain balance, cues for upper trunk control.     Stairs             Wheelchair Mobility    Modified Rankin (Stroke Patients Only)       Balance Overall balance assessment: Needs assistance Sitting-balance support: No upper extremity supported;Feet supported Sitting balance-Leahy Scale: Fair       Standing balance-Leahy Scale: Poor Standing balance comment: Reliant on BUE support and external support.                             Cognition Arousal/Alertness: Awake/alert Behavior During Therapy: Flat affect Overall Cognitive Status: Impaired/Different from baseline Area of Impairment: Safety/judgement;Problem solving                       Following Commands: Follows one step commands with increased time Safety/Judgement: Decreased awareness of deficits;Decreased awareness of safety   Problem Solving: Requires verbal cues;Slow processing        Exercises      General Comments        Pertinent Vitals/Pain Pain Assessment: 0-10 Faces Pain Scale: Hurts even more Pain Location: abdominal pain at drain site with movement Pain Descriptors / Indicators:  Aching;Operative site guarding Pain Intervention(s): Monitored during session;Repositioned    Home Living                      Prior Function            PT Goals (current goals can now be found in the care plan section) Acute Rehab PT Goals Patient Stated Goal: to feel better Potential to Achieve Goals: Fair Progress towards PT goals: Progressing toward goals    Frequency    Min 2X/week      PT Plan Current plan remains appropriate     Co-evaluation              AM-PAC PT "6 Clicks" Daily Activity  Outcome Measure  Difficulty turning over in bed (including adjusting bedclothes, sheets and blankets)?: Unable Difficulty moving from lying on back to sitting on the side of the bed? : Unable Difficulty sitting down on and standing up from a chair with arms (e.g., wheelchair, bedside commode, etc,.)?: Unable Help needed moving to and from a bed to chair (including a wheelchair)?: A Little Help needed walking in hospital room?: A Little Help needed climbing 3-5 steps with a railing? : A Little 6 Click Score: 12    End of Session Equipment Utilized During Treatment: Gait belt Activity Tolerance: Patient limited by pain;Patient limited by fatigue Patient left: in chair;with call bell/phone within reach;with chair alarm set;with family/visitor present Nurse Communication: Mobility status PT Visit Diagnosis: Other abnormalities of gait and mobility (R26.89);Unsteadiness on feet (R26.81);Muscle weakness (generalized) (M62.81);Pain Pain - part of body: (abdomen)     Time: 3202-3343 PT Time Calculation (min) (ACUTE ONLY): 35 min  Charges:  $Gait Training: 8-22 mins $Therapeutic Activity: 8-22 mins                    G Codes:       [name], PTA pager [phone]    [name] 12/04/2017, 3:31 PM

## 2017-12-04 NOTE — Progress Notes (Signed)
Patient ID: David [name], male   DOB: [birth year], 72 y.o.   MRN: [id number]  PROGRESS NOTE  GRYPHON VANDERVEEN  QIO:[id number] DOB: 1945-12-04 DOA: 11/24/2017 PCP: [name], MD  Outpatient Specialists: Cardiology, Dr. [name] Brief Narrative: David [name] is a 72 y.o. male with a history of CAD s/p stenting Nov 2018 on DAPT, previous CHF with most recent echo normal, T2DM, stage II CKD, HTN who presented with periumbilical abdominal pain found to have appendicitis related to appendicolith on imaging. Also with AKI and hyperglycemia. Surgery consulted and cardiology saw patient to inform decisions regarding antiplatelet therapy. Plavix has been held. Sepsis worsened despite antibiotics and surgery performed drainage of appendiceal abscess with drain placement 7/11. There was some perioperative hypotension and evidence of acute renal failure with ATN for which nephrology was consulted. This has slowly improved. He has continued a slow recovery, very little motivation to mobilize and advance diet. PT has recommended SNF. Though renal function is improving, he continues to require IV fluids support due to not taking much po.  Assessment & Plan: Principal Problem:   Appendicitis Active Problems:   Hypertension   Diabetes mellitus ([place])   Status post coronary artery stent placement   Acute renal failure superimposed on stage 3 chronic kidney disease (HCC)  Sepsis due to acute appendiceal abscess: s/p drainage and drain placement 7/11 by Dr. Lucia [name]: Anticipate slow recovery given age.  - Diet per surgery.  - Leukocytosis with slow steady improvement. Afebrile. Continue zosyn per surgery.  - Continue drain per surgery.  Acute renal failure on stage II CKD: Microscopy consistent with florid ATN. Renal U/S negative.  - Continuing to improve with IV fluids. UOP not reliable. Nephrology signed off 7/17. - Avoid hypotension, nephrotoxins. - Renally dosing medications - Continue monitoring UOP,  Cr.   Hypokalemia:  - Poor po continues.  Labs pending  Hypernatremia: Mild.  - Continue hypotonic fluids and monitor.  Watery diarrhea: Resolved- Continue probiotics  CAD s/p PCI by Dr. [name] 04/06/2017: Maintained on DAPT. Appears euvolemic.  - Cardiology, Dr. [name], evaluated patient. Continue ASA, ok to hold plavix for surgery.  - Defer to surgery, when can we restart plavix? - Continue statin   Acute blood loss anemia on anemia of chronic disease:  - Improved s/p 1u PRBCs 7/14.  No overt bleeding. Continue monitoring and transfuse for hgb < 8 w/CAD.  HTN:  - Continue metoprolol, bidil, norvasc  IDT2DM: HbA1c 7.5%. - Continue levemir (dose decreased) and sensitive SSI. At inpatient goal without hypoglycemia or much need for SSI. On D5 to prevent catabolism while advancing diet.  Left hip pain: Reported fall in bathroom a few days PTA with resultant left lateral thigh pain. Resolved.   DVT prophylaxis: SCDs Code Status: Full Family Communication: None at bedside Disposition Plan: Very slow recovery, still eating nearly nothing. May need continued IV fluids. SNF when stabilized.   Consultants:   Surgery  Cardiology  Nephrology  Procedures:  11/26/17 LAPAROSCOPIC ABDOMINAL EXPLORATION, DRAINAGE OF APPENDICEAL ABCESS. PLACEMENT OF David Brunette, MD   Antimicrobials:  Zosyn  Subjective: Patient feels he is feeling better.  Refusing labs this morning.  Has not eaten breakfast yet.   Objective: Vitals:   12/03/17 0551 12/03/17 1451 12/03/17 2110 12/04/17 0500  BP: (!) 168/82 (!) 161/91 (!) 179/89 (!) 163/92  Pulse: 69 72 69 79  Resp: 17  18 20   Temp: 98.4 F (36.9 C) 98.8 F (37.1 C) 98.7 F (37.1 C) 98.8 F (37.1  C)  TempSrc: Oral Oral Oral Oral  SpO2: 100% 96% 97% 98%  Weight:      Height:        Intake/Output Summary (Last 24 hours) at 12/04/2017 0945 Last data filed at 12/04/2017 0500 Gross per 24 hour  Intake 300 ml  Output 200 ml  Net  100 ml   Filed Weights   11/24/17 1004 11/26/17 1507  Weight: 99.8 kg (220 lb) 99.8 kg (220 lb)   Gen: 72 y.o. male in no distress Pulm: Nonlabored breathing room air. Clear. CV: Regular rate and rhythm. No murmur, rub, or gallop. No JVD, no dependent edema. GI: Abdomen soft, tender in RLQ without significant surrounding erythema, dressings c/d/i. Serosanguinous output in JP. Non-distended, with normoactive bowel sounds.  Ext: Warm, no deformities Skin: No rashes, lesions or ulcers on visualized skin.  Neuro: Alert and oriented. No focal neurological deficits. Psych: Judgement and insight appear fair. Mood euthymic & affect congruent. Behavior is appropriate.    CBC: Recent Labs  Lab 11/28/17 0340 11/29/17 0711 11/30/17 0319 12/02/17 0446 12/03/17 0450  WBC 17.4* 14.0* 13.5* 13.5* 12.5*  HGB 8.9* 7.7* 8.7* 8.0* 8.3*  HCT 29.7* 24.9* 28.1* 25.6* 26.6*  MCV 108.4* 106.9* 104.1* 103.2* 104.3*  PLT 316 298 313 318 354   Basic Metabolic Panel: Recent Labs  Lab 11/29/17 0711 11/30/17 0319 12/01/17 1417 12/02/17 0446 12/03/17 0450  NA 137 141 145 145 148*  K 3.2* 3.2* 3.2* 3.0* 3.3*  CL 105 108 111 112* 114*  CO2 22 23 25 25 25   GLUCOSE 98 154* 85 143* 141*  BUN 52* 49* 33* 27* 19  CREATININE 5.91* 5.38* 3.07* 2.53* 1.96*  CALCIUM 7.5* 7.5* 8.1* 7.9* 8.1*  MG  --  2.2  --   --   --    GFR: Estimated Creatinine Clearance: 44.2 mL/min (A) (by C-G formula based on SCr of 1.96 mg/dL (H)). Liver Function Tests: No results for input(s): AST, ALT, ALKPHOS, BILITOT, PROT, ALBUMIN in the last 168 hours. No results for input(s): LIPASE, AMYLASE in the last 168 hours. No results for input(s): AMMONIA in the last 168 hours. Coagulation Profile: No results for input(s): INR, PROTIME in the last 168 hours. Cardiac Enzymes: No results for input(s): CKTOTAL, CKMB, CKMBINDEX, TROPONINI in the last 168 hours. BNP (last 3 results) No results for input(s): PROBNP in the last 8760  hours. HbA1C: No results for input(s): HGBA1C in the last 72 hours. CBG: Recent Labs  Lab 12/03/17 1814 12/03/17 2109 12/03/17 2203 12/03/17 2358 12/04/17 0749  GLUCAP 94 60* 76 113* 98   Lipid Profile: No results for input(s): CHOL, HDL, LDLCALC, TRIG, CHOLHDL, LDLDIRECT in the last 72 hours. Thyroid Function Tests: No results for input(s): TSH, T4TOTAL, FREET4, T3FREE, THYROIDAB in the last 72 hours. Anemia Panel: No results for input(s): VITAMINB12, FOLATE, FERRITIN, TIBC, IRON, RETICCTPCT in the last 72 hours. Urine analysis:    Component Value Date/Time   COLORURINE AMBER (A) 11/27/2017 1313   APPEARANCEUR CLOUDY (A) 11/27/2017 1313   LABSPEC 1.021 11/27/2017 1313   PHURINE 5.0 11/27/2017 1313   GLUCOSEU NEGATIVE 11/27/2017 1313   HGBUR MODERATE (A) 11/27/2017 1313   BILIRUBINUR NEGATIVE 11/27/2017 1313   KETONESUR NEGATIVE 11/27/2017 1313   PROTEINUR 100 (A) 11/27/2017 1313   UROBILINOGEN 1.0 02/13/2015 1934   NITRITE NEGATIVE 11/27/2017 1313   LEUKOCYTESUR NEGATIVE 11/27/2017 1313   Recent Results (from the past 240 hour(s))  Surgical pcr screen     Status: None  Collection Time: 11/25/17  9:21 PM  Result Value Ref Range Status   MRSA, PCR NEGATIVE NEGATIVE Final   Staphylococcus aureus NEGATIVE NEGATIVE Final    Comment: (NOTE) The Xpert SA Assay (FDA approved for NASAL specimens in patients 86 years of age and older), is one component of a comprehensive surveillance program. It is not intended to diagnose infection nor to guide or monitor treatment. Performed at [facility], [place] [address] [place], [place] [address]   Culture, Urine     Status: None   Collection Time: 11/28/17 10:30 AM  Result Value Ref Range Status   Specimen Description URINE, CLEAN CATCH  Final   Special Requests NONE  Final   Culture   Final    NO GROWTH Performed at [facility], [place] [address] [place], [place] [address]    Report Status 11/29/2017 FINAL  Final        Radiology Studies: No results found.  Scheduled Meds: . amLODipine  5 mg Oral Daily  . aspirin  81 mg Oral Daily  . atorvastatin  40 mg Oral QHS  . dorzolamide-timolol  1 drop Both Eyes BID  . feeding supplement  1 Container Oral TID BM  . fluticasone  1 spray Each Nare Daily  . insulin aspart  0-9 Units Subcutaneous TID WC  . insulin detemir  30 Units Subcutaneous Daily  . isosorbide-hydrALAZINE  1 tablet Oral TID  . metoprolol succinate  100 mg Oral Daily  . potassium chloride  20 mEq Oral Once  . saccharomyces boulardii  250 mg Oral BID   Continuous Infusions: . sodium chloride 10 mL/hr at 11/26/17 1509  . dextrose 5 % and 0.45% NaCl    . piperacillin-tazobactam (ZOSYN)  IV 3.375 g (12/04/17 0409)     LOS: 10 days   Time spent: 20 minutes.  Phillips Grout, MD [facility] [email or web address] Password [email or web address] 12/04/2017, 9:45 AM

## 2017-12-04 NOTE — Progress Notes (Signed)
Patient is refusing lab draws and finger sticks at this time.

## 2017-12-04 NOTE — Progress Notes (Signed)
RN removed pt JP drain per Saverio Danker, PA order patient tolerated well. RN applied dried dressing will continue to monitor.

## 2017-12-04 NOTE — Progress Notes (Signed)
Patient ID: David [name], male   DOB: [birth year], 72 y.o.   MRN: [id number]    8 Days Post-Op  Subjective: Patient in a good mood this morning.  Says he feels a little better today but that is because his diarrhea has lessened.  In discussion, this seems to be because he hasn't been eating, on purpose to stop his diarrhea.  Objective: Vital signs in last 24 hours: Temp:  [98.7 F (37.1 C)-98.8 F (37.1 C)] 98.8 F (37.1 C) (07/19 0500) Pulse Rate:  [69-79] 79 (07/19 0500) Resp:  [18-20] 20 (07/19 0500) BP: (161-179)/(89-92) 163/92 (07/19 0500) SpO2:  [96 %-98 %] 98 % (07/19 0500) Last BM Date: 12/02/17  Intake/Output from previous day: 07/18 0701 - 07/19 0700 In: 300 [P.O.:120; I.V.:100; IV Piggyback:50] Out: 480 [Urine:480] Intake/Output this shift: No intake/output data recorded.  PE: Abd: soft, minimally tender, incisions are c/d/i.  JP with minimal output, serous.  Lab Results:  Recent Labs    12/03/17 0450 12/04/17 0946  WBC 12.5* 13.2*  HGB 8.3* 9.0*  HCT 26.6* 28.7*  PLT 348 380   BMET Recent Labs    12/03/17 0450 12/04/17 0946  NA 148* 146*  K 3.3* 3.1*  CL 114* 111  CO2 25 27  GLUCOSE 141* 116*  BUN 19 10  CREATININE 1.96* 1.74*  CALCIUM 8.1* 8.4*   PT/INR No results for input(s): LABPROT, INR in the last 72 hours. CMP     Component Value Date/Time   NA 146 (H) 12/04/2017 0946   K 3.1 (L) 12/04/2017 0946   CL 111 12/04/2017 0946   CO2 27 12/04/2017 0946   GLUCOSE 116 (H) 12/04/2017 0946   BUN 10 12/04/2017 0946   CREATININE 1.74 (H) 12/04/2017 0946   CALCIUM 8.4 (L) 12/04/2017 0946   PROT 6.9 11/24/2017 1015   ALBUMIN 2.7 (L) 11/24/2017 1015   AST 39 11/24/2017 1015   ALT 33 11/24/2017 1015   ALKPHOS 94 11/24/2017 1015   BILITOT 0.7 11/24/2017 1015   GFRNONAA 38 (L) 12/04/2017 0946   GFRAA 44 (L) 12/04/2017 0946   Lipase     Component Value Date/Time   LIPASE 24 11/24/2017 1015       Studies/Results: No results  found.  Anti-infectives: Anti-infectives (From admission, onward)   Start     Dose/Rate Route Frequency Ordered Stop   12/02/17 1100  piperacillin-tazobactam (ZOSYN) IVPB 3.375 g     3.375 g 12.5 mL/hr over 240 Minutes Intravenous Every 8 hours 12/02/17 0959     11/28/17 1900  piperacillin-tazobactam (ZOSYN) IVPB 2.25 g  Status:  Discontinued     2.25 g 100 mL/hr over 30 Minutes Intravenous Every 8 hours 11/28/17 1602 12/02/17 0959   11/25/17 0300  piperacillin-tazobactam (ZOSYN) IVPB 3.375 g  Status:  Discontinued     3.375 g 12.5 mL/hr over 240 Minutes Intravenous Every 8 hours 11/24/17 1951 11/28/17 1602   11/24/17 1930  piperacillin-tazobactam (ZOSYN) IVPB 3.375 g     3.375 g 100 mL/hr over 30 Minutes Intravenous  Once 11/24/17 1924 11/24/17 2229   11/24/17 1330  cefTRIAXone (ROCEPHIN) 2 g in sodium chloride 0.9 % 100 mL IVPB     2 g 200 mL/hr over 30 Minutes Intravenous  Once 11/24/17 1327 11/24/17 1456   11/24/17 1330  metroNIDAZOLE (FLAGYL) IVPB 500 mg     500 mg 100 mL/hr over 60 Minutes Intravenous  Once 11/24/17 1327 11/24/17 1658       Assessment/Plan HTN HLD CAD  s/p PCI 2018 Acute on chronic anemia -hgb8.0today DMT2,uncontrolled - A1C 7.5%, SSI CKD with acute renal failure - nephrology consulted,suspect ATN,Cr2.53 today Chronic back pain  Acute appendicitis S/pLAPAROSCOPIC ABDOMINAL EXPLORATION, DRAINAGE OF APPENDICEAL ABCESS. PLACEMENT OF DRAIN7/11 Dr. Lucia Gaskins - POD8, afebrile, VSS, WBCstable at 13 -drain withscant serous output.  Will likely pull drain today if CT read as negative.  Appears to be negative after our review. -cont probiotics given diarrhea.  C diff negative. -encouraged patient to eat and drink today.  Bowels look good on CT scan and told him his gut will regulate better once he begins to eat better.  ID -zosyn 7/9>> FEN -IVF,soft diet, Ensure VTE -SCDs Foley -none  Plan: await CT read, encourage diet, likely DC JP  today.  Will discuss with Dr. Redmond Pulling how much longer he needs abx therapy, completed 10 days.  Can likely DC.     LOS: 10 days    Henreitta Cea , [facility] Surgery 12/04/2017, 11:29 AM Pager: [phone]

## 2017-12-04 NOTE — Progress Notes (Signed)
OT Cancellation Note  Patient Details Name: IVERSON SEES MRN: [id number] DOB: December 06, 1945   Cancelled Treatment:    Reason Eval/Treat Not Completed: Patient declined, no reason specified.  Patient requesting bath from NT, and refusing to engage in ADL with therapy or transitioning to EOB.  Reviewed importance of mobility, and returning to PLOF, reports promising to walk the hall this afternoon; but declining OT.  Patient aware of multiple refusals and advised he will be discharged from OT services per protocol.  If needs arise, and patient is willing to participate, please re-order.     Delight Stare 12/04/2017, 12:48 PM

## 2017-12-05 LAB — BASIC METABOLIC PANEL
ANION GAP: 9 (ref 5–15)
BUN: 12 mg/dL (ref 8–23)
CHLORIDE: 112 mmol/L — AB (ref 98–111)
CO2: 27 mmol/L (ref 22–32)
Calcium: 8.3 mg/dL — ABNORMAL LOW (ref 8.9–10.3)
Creatinine, Ser: 1.76 mg/dL — ABNORMAL HIGH (ref 0.61–1.24)
GFR calc Af Amer: 43 mL/min — ABNORMAL LOW (ref >60)
GFR, EST NON AFRICAN AMERICAN: 37 mL/min — AB (ref >60)
GLUCOSE: 85 mg/dL (ref 70–99)
POTASSIUM: 2.9 mmol/L — AB (ref 3.5–5.1)
Sodium: 148 mmol/L — ABNORMAL HIGH (ref 135–145)

## 2017-12-05 LAB — GLUCOSE, CAPILLARY
GLUCOSE-CAPILLARY: 128 mg/dL — AB (ref 70–99)
GLUCOSE-CAPILLARY: 180 mg/dL — AB (ref 70–99)
GLUCOSE-CAPILLARY: 72 mg/dL (ref 70–99)
GLUCOSE-CAPILLARY: 80 mg/dL (ref 70–99)
GLUCOSE-CAPILLARY: 81 mg/dL (ref 70–99)
Glucose-Capillary: 123 mg/dL — ABNORMAL HIGH (ref 70–99)
Glucose-Capillary: 63 mg/dL — ABNORMAL LOW (ref 70–99)
Glucose-Capillary: 165 mg/dL — ABNORMAL HIGH (ref 70–99)

## 2017-12-05 LAB — CBC
HEMATOCRIT: 27.6 % — AB (ref 39.0–52.0)
HEMOGLOBIN: 8.6 g/dL — AB (ref 13.0–17.0)
MCH: 32.5 pg (ref 26.0–34.0)
MCHC: 31.2 g/dL (ref 30.0–36.0)
MCV: 104.2 fL — ABNORMAL HIGH (ref 78.0–100.0)
PLATELETS: 374 10*3/uL (ref 150–400)
RBC: 2.65 MIL/uL — ABNORMAL LOW (ref 4.22–5.81)
RDW: 12.7 % (ref 11.5–15.5)
WBC: 11.8 10*3/uL — ABNORMAL HIGH (ref 4.0–10.5)

## 2017-12-05 MED ORDER — OXYCODONE-ACETAMINOPHEN 5-325 MG PO TABS
1.0000 | ORAL_TABLET | Freq: Four times a day (QID) | ORAL | Status: DC | PRN
Start: 2017-12-05 — End: 2017-12-09
  Administered 2017-12-05 – 2017-12-07 (×7): 2 via ORAL
  Administered 2017-12-07: 1 via ORAL
  Administered 2017-12-09 (×2): 2 via ORAL
  Filled 2017-12-05 (×10): qty 2

## 2017-12-05 MED ORDER — DEXTROSE 50 % IV SOLN
INTRAVENOUS | Status: AC
  Administered 2017-12-05: 25 mL
  Filled 2017-12-05: qty 50

## 2017-12-05 MED ORDER — POTASSIUM CHLORIDE CRYS ER 20 MEQ PO TBCR
40.0000 meq | EXTENDED_RELEASE_TABLET | Freq: Every day | ORAL | Status: DC
  Filled 2017-12-05: qty 2

## 2017-12-05 MED ORDER — DEXTROSE 50 % IV SOLN: 25.0000 mL | Freq: Once | INTRAVENOUS | Status: DC

## 2017-12-05 MED ORDER — LOPERAMIDE HCL 2 MG PO CAPS: 2.0000 mg | ORAL_CAPSULE | Freq: Three times a day (TID) | ORAL | Status: DC | PRN

## 2017-12-05 MED ORDER — POTASSIUM CHLORIDE 10 MEQ/100ML IV SOLN
10.0000 meq | INTRAVENOUS | Status: AC
  Administered 2017-12-05 (×4): 10 meq via INTRAVENOUS
  Filled 2017-12-05 (×4): qty 100

## 2017-12-05 MED ORDER — TRAMADOL HCL 50 MG PO TABS
50.0000 mg | ORAL_TABLET | Freq: Four times a day (QID) | ORAL | Status: DC | PRN
  Administered 2017-12-08 (×2): 50 mg via ORAL
  Filled 2017-12-05 (×2): qty 1

## 2017-12-05 NOTE — Progress Notes (Signed)
Hypoglycemic Event  CBG:63  Treatment: 17ml dextrose  Symptoms: none  Follow-up CBG: Time:0705 CBG Result:**123  Possible Reasons for Event: poor po intake  Comments/MD notified:no    Owens Shark, Hector Shade

## 2017-12-05 NOTE — Progress Notes (Signed)
Patient requested for IV dilaudid I offered the 50mg  of Tramadol PO stated that the pills don't work for me. I stated that he needed to try to transition to po medication because he will not be getting IV meds at home. He then stated that he had the medication at home. I asked if he takes IV drugs at home he then stated no. I suggested that he try the PO and if that doesn't work then the IV medication will be given. He then stated he didn't need any thing just some black coffee in which I gave him. Arthor Captain LPN

## 2017-12-05 NOTE — Progress Notes (Signed)
Patient ID: David [name], male   DOB: 01-Jul-1945, 72 y.o.   MRN: [id number] Patient ID: David [name], male   DOB: [birth year], 72 y.o.   MRN: [id number]  PROGRESS NOTE  David [name]  NLG:[id number] DOB: [birth year] DOA: 11/24/2017 PCP: [name], MD  Outpatient Specialists: Cardiology, Dr. [name] Brief Narrative: David [name] is a 72 y.o. male with a history of CAD s/p stenting Nov 2018 on DAPT, previous CHF with most recent echo normal, T2DM, stage II CKD, HTN who presented with periumbilical abdominal pain found to have appendicitis related to appendicolith on imaging. Also with AKI and hyperglycemia. Surgery consulted and cardiology saw patient to inform decisions regarding antiplatelet therapy. Plavix has been held. Sepsis worsened despite antibiotics and surgery performed drainage of appendiceal abscess with drain placement 7/11. There was some perioperative hypotension and evidence of acute renal failure with ATN for which nephrology was consulted. This has slowly improved. He has continued a slow recovery, very little motivation to mobilize and advance diet. PT has recommended SNF and OT has signed off because patient refused to participate.  Renal function improving.  JP drain removed on 12/04/2017.  Zosyn stopped 12/05/2017.  Assessment & Plan: Principal Problem:   Appendicitis Active Problems:   Hypertension   Diabetes mellitus ([place])   Status post coronary artery stent placement   Acute renal failure superimposed on stage 3 chronic kidney disease (HCC)  Sepsis due to acute appendiceal abscess: s/p drainage and drain placement 7/11 by Dr. Lucia Gaskins: Anticipate slow recovery given age.  - Diet per surgery.  - Leukocytosis with slow steady improvement. Afebrile.  Agree with stopping Zosyn today -JP drain stopped yesterday by surgery team.  Acute renal failure on stage II CKD: Microscopy consistent with florid ATN. Renal U/S negative.  - Continuing to improve with IV fluids.  UOP not reliable. Nephrology signed off 7/17. - Avoid hypotension, nephrotoxins. - Renally dosing medications - Continue monitoring UOP, Cr.   Hypokalemia:  - Poor po continues.  Placed as needed along with mag levels  Hypernatremia: Mild.  - Continue hypotonic fluids and monitor.  Watery diarrhea: Resolved- Continue probiotics  CAD s/p PCI by Dr. [name] 04/06/2017: Maintained on DAPT. Appears euvolemic.  - Cardiology, Dr. [name], evaluated patient. Continue ASA, ok to hold plavix for surgery.  - Defer to surgery, when can we restart plavix? - Continue statin   Acute blood loss anemia on anemia of chronic disease:  - Improved s/p 1u PRBCs 7/14.  No overt bleeding. Continue monitoring and transfuse for hgb < 8 w/CAD.  HTN:  - Continue metoprolol, bidil, norvasc  IDT2DM: HbA1c 7.5%. - Continue levemir (dose decreased) and sensitive SSI. At inpatient goal without hypoglycemia or much need for SSI. On D5 to prevent catabolism while advancing diet.  Left hip pain: Reported fall in bathroom a few days PTA with resultant left lateral thigh pain. Resolved.   DVT prophylaxis: SCDs Code Status: Full Family Communication: None at bedside Disposition Plan: Rehab when cleared by surgery  Consultants:   Surgery  Cardiology  Nephrology  Procedures:  11/26/17 LAPAROSCOPIC ABDOMINAL EXPLORATION, DRAINAGE OF APPENDICEAL ABCESS. PLACEMENT OF Merri Brunette, MD   Antimicrobials:  Zosyn  Subjective: Patient refusing oral contrast for his CAT scan which was poor quality.  He is refusing physical therapy and Occupational Therapy frequently.  He is refusing lab draws and glucose checks often.  Patient complained today because he reports he has no idea why he is here and what is wrong  with him despite me explaining to him the complications he had with an abscess associated with appendicitis.  J P drain was removed yesterday by general surgery.  Objective: Vitals:   12/04/17 2035  12/04/17 2334 12/05/17 0500 12/05/17 0844  BP: (!) 181/93 (!) 153/74 (!) 171/79 (!) 168/76  Pulse: 64  64 64  Resp:      Temp: 98.5 F (36.9 C)  98.4 F (36.9 C)   TempSrc: Oral  Oral   SpO2: 95%  97%   Weight:      Height:        Intake/Output Summary (Last 24 hours) at 12/05/2017 1018 Last data filed at 12/05/2017 0309 Gross per 24 hour  Intake 480 ml  Output 1100 ml  Net -620 ml   Filed Weights   11/24/17 1004 11/26/17 1507  Weight: 99.8 kg (220 lb) 99.8 kg (220 lb)   Gen: 72 y.o. male in no distress Pulm: Nonlabored breathing room air. Clear. CV: Regular rate and rhythm. No murmur, rub, or gallop. No JVD, no dependent edema. GI: Abdomen soft, tender in RLQ without significant surrounding erythema, dressings c/d/i.  Non-distended, with normoactive bowel sounds.  Ext: Warm, no deformities Skin: No rashes, lesions or ulcers on visualized skin.  Neuro: Alert and oriented. No focal neurological deficits. Psych: Judgement and insight appear fair. Mood euthymic & affect congruent. Behavior is appropriate.    CBC: Recent Labs  Lab 11/30/17 0319 12/02/17 0446 12/03/17 0450 12/04/17 0946 12/05/17 0331  WBC 13.5* 13.5* 12.5* 13.2* 11.8*  HGB 8.7* 8.0* 8.3* 9.0* 8.6*  HCT 28.1* 25.6* 26.6* 28.7* 27.6*  MCV 104.1* 103.2* 104.3* 104.4* 104.2*  PLT 313 318 348 380 169   Basic Metabolic Panel: Recent Labs  Lab 11/30/17 0319 12/01/17 1417 12/02/17 0446 12/03/17 0450 12/04/17 0946 12/05/17 0331  NA 141 145 145 148* 146* 148*  K 3.2* 3.2* 3.0* 3.3* 3.1* 2.9*  CL 108 111 112* 114* 111 112*  CO2 23 25 25 25 27 27   GLUCOSE 154* 85 143* 141* 116* 85  BUN 49* 33* 27* 19 10 12   CREATININE 5.38* 3.07* 2.53* 1.96* 1.74* 1.76*  CALCIUM 7.5* 8.1* 7.9* 8.1* 8.4* 8.3*  MG 2.2  --   --   --   --   --    GFR: Estimated Creatinine Clearance: 49.3 mL/min (A) (by C-G formula based on SCr of 1.76 mg/dL (H)). Liver Function Tests: No results for input(s): AST, ALT, ALKPHOS, BILITOT,  PROT, ALBUMIN in the last 168 hours. No results for input(s): LIPASE, AMYLASE in the last 168 hours. No results for input(s): AMMONIA in the last 168 hours. Coagulation Profile: No results for input(s): INR, PROTIME in the last 168 hours. Cardiac Enzymes: No results for input(s): CKTOTAL, CKMB, CKMBINDEX, TROPONINI in the last 168 hours. BNP (last 3 results) No results for input(s): PROBNP in the last 8760 hours. HbA1C: No results for input(s): HGBA1C in the last 72 hours. CBG: Recent Labs  Lab 12/04/17 0749 12/04/17 1200 12/04/17 1654 12/04/17 2038 12/05/17 0055  GLUCAP 98 112* 103* 106* 80   Lipid Profile: No results for input(s): CHOL, HDL, LDLCALC, TRIG, CHOLHDL, LDLDIRECT in the last 72 hours. Thyroid Function Tests: No results for input(s): TSH, T4TOTAL, FREET4, T3FREE, THYROIDAB in the last 72 hours. Anemia Panel: No results for input(s): VITAMINB12, FOLATE, FERRITIN, TIBC, IRON, RETICCTPCT in the last 72 hours. Urine analysis:    Component Value Date/Time   COLORURINE AMBER (A) 11/27/2017 1313   APPEARANCEUR CLOUDY (A)  11/27/2017 1313   LABSPEC 1.021 11/27/2017 1313   PHURINE 5.0 11/27/2017 1313   GLUCOSEU NEGATIVE 11/27/2017 1313   HGBUR MODERATE (A) 11/27/2017 1313   BILIRUBINUR NEGATIVE 11/27/2017 1313   KETONESUR NEGATIVE 11/27/2017 1313   PROTEINUR 100 (A) 11/27/2017 1313   UROBILINOGEN 1.0 02/13/2015 1934   NITRITE NEGATIVE 11/27/2017 1313   LEUKOCYTESUR NEGATIVE 11/27/2017 1313   Recent Results (from the past 240 hour(s))  Surgical pcr screen     Status: None   Collection Time: 11/25/17  9:21 PM  Result Value Ref Range Status   MRSA, PCR NEGATIVE NEGATIVE Final   Staphylococcus aureus NEGATIVE NEGATIVE Final    Comment: (NOTE) The Xpert SA Assay (FDA approved for NASAL specimens in patients 11 years of age and older), is one component of a comprehensive surveillance program. It is not intended to diagnose infection nor to guide or monitor  treatment. Performed at [facility], [place] [address] [place], [place] [address]   Culture, Urine     Status: None   Collection Time: 11/28/17 10:30 AM  Result Value Ref Range Status   Specimen Description URINE, CLEAN CATCH  Final   Special Requests NONE  Final   Culture   Final    NO GROWTH Performed at [facility], [place] [address], [place],  [address]    Report Status 11/29/2017 FINAL  Final      Radiology Studies: Ct Abdomen Pelvis Wo Contrast  Result Date: 12/04/2017 CLINICAL DATA:  Perforated appendicitis.  Persistent pain. EXAM: CT ABDOMEN AND PELVIS WITHOUT CONTRAST TECHNIQUE: Multidetector CT imaging of the abdomen and pelvis was performed following the standard protocol without IV contrast. COMPARISON:  11/24/2017 FINDINGS: Lower chest: Atherosclerotic calcifications are noted involving the LAD, left circumflex and RCA coronary arteries. Hepatobiliary: There is no focal liver abnormality. The gallbladder appears normal. No biliary dilatation. Pancreas: Unremarkable. No pancreatic ductal dilatation or surrounding inflammatory changes. Spleen: Normal in size without focal abnormality. Adrenals/Urinary Tract: The adrenal glands are normal. No kidney mass or hydronephrosis identified. Urinary bladder is unremarkable. Stomach/Bowel: The stomach appears nondistended. The proximal and mid small bowel loops are unremarkable. Again noted are extensive inflammatory changes involving the right lower quadrant small bowel loops. Changes include large and small bowel wall edema with diffuse surrounding fat stranding. Evaluation of this area is significantly diminished due to lack of both IV and oral contrast material. A right lower quadrant percutaneous drainage catheter is identified which terminates in the central pelvis. There is a calcification within the central pelvis measuring 7 mm which in the setting of recent ruptured appendicitis may represent an appendicolith. No large  fluid collections identified. Vascular/Lymphatic: No significant vascular findings are present. No enlarged abdominal or pelvic lymph nodes. Reproductive: Prostate is unremarkable. Other: Diffuse fat stranding and mesenteric edema is identified. Small amount of free fluid noted within the dependent portion of the pelvis. Musculoskeletal: Degenerative disc disease noted within the lumbar spine. Previous right hip arthroplasty. IMPRESSION: 1. Evaluation of right lower quadrant inflammatory changes is significantly diminished due to lack of IV and oral contrast material. Consider further evaluation with contrast enhanced CT of the abdomen and pelvis following oral contrast administration to assess patency of bowel, rule out abscess, and evaluate for fistula. 2. Again noted are extensive inflammatory changes within the right lower quadrant of the abdomen. As mentioned on previous studies findings may represent the sequelae of ruptured appendicitis. Differential considerations include inflammatory/infectious enteritis. 3. Calcification in the right side of  pelvis is noted which in the setting of ruptured appendicitis may represent and appendicolith Electronically Signed   By: Kerby Moors M.D.   On: 12/04/2017 11:48    Scheduled Meds: . amLODipine  5 mg Oral Daily  . aspirin  81 mg Oral Daily  . atorvastatin  40 mg Oral QHS  . dextrose  25 mL Intravenous Once  . dorzolamide-timolol  1 drop Both Eyes BID  . feeding supplement  1 Container Oral TID BM  . fluticasone  1 spray Each Nare Daily  . insulin aspart  0-9 Units Subcutaneous TID WC  . insulin detemir  30 Units Subcutaneous Daily  . isosorbide-hydrALAZINE  1 tablet Oral TID  . metoprolol succinate  100 mg Oral Daily  . potassium chloride  40 mEq Oral Daily  . saccharomyces boulardii  250 mg Oral BID   Continuous Infusions: . sodium chloride 10 mL/hr at 11/26/17 1509  . dextrose 5 % and 0.45% NaCl       LOS: 11 days   Time spent: 20  minutes.  David Grout, MD [facility] [email or web address] Password [email or web address] 12/05/2017, 10:18 AM

## 2017-12-05 NOTE — Plan of Care (Signed)
  Problem: Activity: Goal: Risk for activity intolerance will decrease Outcome: Progressing   Problem: Coping: Goal: Level of anxiety will decrease Outcome: Progressing   Problem: Elimination: Goal: Will not experience complications related to bowel motility Outcome: Progressing Goal: Will not experience complications related to urinary retention Outcome: Progressing   Problem: Elimination: Goal: Will not experience complications related to urinary retention Outcome: Progressing   Problem: Pain Managment: Goal: General experience of comfort will improve Outcome: Progressing   Problem: Safety: Goal: Ability to remain free from injury will improve Outcome: Progressing

## 2017-12-05 NOTE — Progress Notes (Signed)
Pt refused to take potassium, says it hurts his stomach RN educated pt and he still refused will notify MD

## 2017-12-05 NOTE — Progress Notes (Signed)
Patient expressed concern about his dark colored diarrhea. Arthor Captain LPN

## 2017-12-05 NOTE — Progress Notes (Signed)
Pt making progress in diet he ate some of his breakfast and a bananna as well as he ate his lunch a pb&j sandwich and 2 sides of peaches

## 2017-12-05 NOTE — Progress Notes (Addendum)
Patient ID: David [name], male   DOB: [birth year], 72 y.o.   MRN: [id number]    9 Days Post-Op  Subjective: C/o dark watery stools  Objective: Vital signs in last 24 hours: Temp:  [98.4 F (36.9 C)-98.8 F (37.1 C)] 98.4 F (36.9 C) (07/20 0500) Pulse Rate:  [64-65] 64 (07/20 0844) Resp:  [20] 20 (07/19 1523) BP: (153-181)/(74-93) 168/76 (07/20 0844) SpO2:  [95 %-98 %] 97 % (07/20 0500) Last BM Date: 12/03/17  Intake/Output from previous day: 07/19 0701 - 07/20 0700 In: 720 [P.O.:720] Out: 1700 [Urine:1600; Drains:100] Intake/Output this shift: No intake/output data recorded.  PE: Abd: soft, minimally tender, incisions are c/d/i.  Drain site leaking  Lab Results:  Recent Labs    12/04/17 0946 12/05/17 0331  WBC 13.2* 11.8*  HGB 9.0* 8.6*  HCT 28.7* 27.6*  PLT 380 374   BMET Recent Labs    12/04/17 0946 12/05/17 0331  NA 146* 148*  K 3.1* 2.9*  CL 111 112*  CO2 27 27  GLUCOSE 116* 85  BUN 10 12  CREATININE 1.74* 1.76*  CALCIUM 8.4* 8.3*   PT/INR No results for input(s): LABPROT, INR in the last 72 hours. CMP     Component Value Date/Time   NA 148 (H) 12/05/2017 0331   K 2.9 (L) 12/05/2017 0331   CL 112 (H) 12/05/2017 0331   CO2 27 12/05/2017 0331   GLUCOSE 85 12/05/2017 0331   BUN 12 12/05/2017 0331   CREATININE 1.76 (H) 12/05/2017 0331   CALCIUM 8.3 (L) 12/05/2017 0331   PROT 6.9 11/24/2017 1015   ALBUMIN 2.7 (L) 11/24/2017 1015   AST 39 11/24/2017 1015   ALT 33 11/24/2017 1015   ALKPHOS 94 11/24/2017 1015   BILITOT 0.7 11/24/2017 1015   GFRNONAA 37 (L) 12/05/2017 0331   GFRAA 43 (L) 12/05/2017 0331   Lipase     Component Value Date/Time   LIPASE 24 11/24/2017 1015       Studies/Results: Ct Abdomen Pelvis Wo Contrast  Result Date: 12/04/2017 CLINICAL DATA:  Perforated appendicitis.  Persistent pain. EXAM: CT ABDOMEN AND PELVIS WITHOUT CONTRAST TECHNIQUE: Multidetector CT imaging of the abdomen and pelvis was performed following  the standard protocol without IV contrast. COMPARISON:  11/24/2017 FINDINGS: Lower chest: Atherosclerotic calcifications are noted involving the LAD, left circumflex and RCA coronary arteries. Hepatobiliary: There is no focal liver abnormality. The gallbladder appears normal. No biliary dilatation. Pancreas: Unremarkable. No pancreatic ductal dilatation or surrounding inflammatory changes. Spleen: Normal in size without focal abnormality. Adrenals/Urinary Tract: The adrenal glands are normal. No kidney mass or hydronephrosis identified. Urinary bladder is unremarkable. Stomach/Bowel: The stomach appears nondistended. The proximal and mid small bowel loops are unremarkable. Again noted are extensive inflammatory changes involving the right lower quadrant small bowel loops. Changes include large and small bowel wall edema with diffuse surrounding fat stranding. Evaluation of this area is significantly diminished due to lack of both IV and oral contrast material. A right lower quadrant percutaneous drainage catheter is identified which terminates in the central pelvis. There is a calcification within the central pelvis measuring 7 mm which in the setting of recent ruptured appendicitis may represent an appendicolith. No large fluid collections identified. Vascular/Lymphatic: No significant vascular findings are present. No enlarged abdominal or pelvic lymph nodes. Reproductive: Prostate is unremarkable. Other: Diffuse fat stranding and mesenteric edema is identified. Small amount of free fluid noted within the dependent portion of the pelvis. Musculoskeletal: Degenerative disc disease noted within the lumbar  spine. Previous right hip arthroplasty. IMPRESSION: 1. Evaluation of right lower quadrant inflammatory changes is significantly diminished due to lack of IV and oral contrast material. Consider further evaluation with contrast enhanced CT of the abdomen and pelvis following oral contrast administration to assess  patency of bowel, rule out abscess, and evaluate for fistula. 2. Again noted are extensive inflammatory changes within the right lower quadrant of the abdomen. As mentioned on previous studies findings may represent the sequelae of ruptured appendicitis. Differential considerations include inflammatory/infectious enteritis. 3. Calcification in the right side of pelvis is noted which in the setting of ruptured appendicitis may represent and appendicolith Electronically Signed   By: Kerby Moors M.D.   On: 12/04/2017 11:48    Anti-infectives: Anti-infectives (From admission, onward)   Start     Dose/Rate Route Frequency Ordered Stop   12/02/17 1100  piperacillin-tazobactam (ZOSYN) IVPB 3.375 g  Status:  Discontinued     3.375 g 12.5 mL/hr over 240 Minutes Intravenous Every 8 hours 12/02/17 0959 12/05/17 0934   11/28/17 1900  piperacillin-tazobactam (ZOSYN) IVPB 2.25 g  Status:  Discontinued     2.25 g 100 mL/hr over 30 Minutes Intravenous Every 8 hours 11/28/17 1602 12/02/17 0959   11/25/17 0300  piperacillin-tazobactam (ZOSYN) IVPB 3.375 g  Status:  Discontinued     3.375 g 12.5 mL/hr over 240 Minutes Intravenous Every 8 hours 11/24/17 1951 11/28/17 1602   11/24/17 1930  piperacillin-tazobactam (ZOSYN) IVPB 3.375 g     3.375 g 100 mL/hr over 30 Minutes Intravenous  Once 11/24/17 1924 11/24/17 2229   11/24/17 1330  cefTRIAXone (ROCEPHIN) 2 g in sodium chloride 0.9 % 100 mL IVPB     2 g 200 mL/hr over 30 Minutes Intravenous  Once 11/24/17 1327 11/24/17 1456   11/24/17 1330  metroNIDAZOLE (FLAGYL) IVPB 500 mg     500 mg 100 mL/hr over 60 Minutes Intravenous  Once 11/24/17 1327 11/24/17 1658       Assessment/Plan HTN HLD CAD s/p PCI 2018 Acute on chronic anemia -hgb8.0today DMT2,uncontrolled - A1C 7.5%, SSI CKD with acute renal failure - nephrology consulted,suspect ATN,Cr2.53 today Chronic back pain  Acute appendicitis S/pLAPAROSCOPIC ABDOMINAL EXPLORATION, DRAINAGE OF  APPENDICEAL ABCESS. PLACEMENT OF DRAIN7/11 Dr. Lucia Gaskins - POD9, afebrile, VSS, WBCstable  -drain site leaking, change drsg prn -cont probiotics given diarrhea.  C diff negative.  Will start imodium prn -encouraged patient to eat and drink today.  Bowels look good on CT scan and told him his gut will regulate better once he begins to eat better. hypokalemia: most likely related to GI losses.  Replace and recheck in AM ID -zosyn 7/9>>7/19 FEN -IVF,soft diet, Ensure VTE -SCDs Foley -none  Plan: encourage diet.  D/c zosyn.  Ambulate     LOS: 11 days   Rosario Adie, MD  [facility]

## 2017-12-06 LAB — BASIC METABOLIC PANEL
Anion gap: 6 (ref 5–15)
BUN: 18 mg/dL (ref 8–23)
CHLORIDE: 113 mmol/L — AB (ref 98–111)
CO2: 27 mmol/L (ref 22–32)
Calcium: 8.1 mg/dL — ABNORMAL LOW (ref 8.9–10.3)
Creatinine, Ser: 2.46 mg/dL — ABNORMAL HIGH (ref 0.61–1.24)
GFR, EST AFRICAN AMERICAN: 29 mL/min — AB (ref >60)
GFR, EST NON AFRICAN AMERICAN: 25 mL/min — AB (ref >60)
Glucose, Bld: 49 mg/dL — ABNORMAL LOW (ref 70–99)
POTASSIUM: 3.2 mmol/L — AB (ref 3.5–5.1)
SODIUM: 146 mmol/L — AB (ref 135–145)

## 2017-12-06 LAB — GLUCOSE, CAPILLARY
GLUCOSE-CAPILLARY: 66 mg/dL — AB (ref 70–99)
GLUCOSE-CAPILLARY: 101 mg/dL — AB (ref 70–99)
GLUCOSE-CAPILLARY: 164 mg/dL — AB (ref 70–99)
Glucose-Capillary: 43 mg/dL — CL (ref 70–99)
Glucose-Capillary: 94 mg/dL (ref 70–99)
Glucose-Capillary: 147 mg/dL — ABNORMAL HIGH (ref 70–99)

## 2017-12-06 LAB — CBC
HEMATOCRIT: 26.1 % — AB (ref 39.0–52.0)
HEMOGLOBIN: 7.9 g/dL — AB (ref 13.0–17.0)
MCH: 32.5 pg (ref 26.0–34.0)
MCHC: 30.3 g/dL (ref 30.0–36.0)
MCV: 107.4 fL — AB (ref 78.0–100.0)
Platelets: 340 10*3/uL (ref 150–400)
RBC: 2.43 MIL/uL — ABNORMAL LOW (ref 4.22–5.81)
RDW: 12.9 % (ref 11.5–15.5)
WBC: 9.5 10*3/uL (ref 4.0–10.5)

## 2017-12-06 MED ORDER — POTASSIUM CHLORIDE CRYS ER 20 MEQ PO TBCR
40.0000 meq | EXTENDED_RELEASE_TABLET | Freq: Once | ORAL | Status: AC
Start: 2017-12-06 — End: 2017-12-06
  Administered 2017-12-06: 40 meq via ORAL
  Filled 2017-12-06: qty 2

## 2017-12-06 MED ORDER — CLOPIDOGREL BISULFATE 75 MG PO TABS: 75.0000 mg | ORAL_TABLET | Freq: Every day | ORAL | Status: DC

## 2017-12-06 MED ORDER — SODIUM CHLORIDE 0.45 % IV BOLUS
500.0000 mL | Freq: Once | INTRAVENOUS | Status: AC
  Administered 2017-12-06: 500 mL via INTRAVENOUS

## 2017-12-06 MED ORDER — INSULIN DETEMIR 100 UNIT/ML ~~LOC~~ SOLN
20.0000 [IU] | Freq: Every day | SUBCUTANEOUS | Status: DC
  Administered 2017-12-07 – 2017-12-09 (×3): 20 [IU] via SUBCUTANEOUS
  Filled 2017-12-06 (×3): qty 0.2

## 2017-12-06 NOTE — Progress Notes (Addendum)
Pt ate his breakfast, lunch and dinner states he is feeling a lot better. No diarrhea today

## 2017-12-06 NOTE — Progress Notes (Signed)
David [name]  OJJ:[id number] DOB: 12-09-1945 DOA: 11/24/2017 PCP: [name], MD    Brief Narrative:  72 y.o. male with a history of CAD s/p stenting Nov 2018 on DAPT, previous CHF with most recent echo normal, DM2, stage II CKD, and HTN who presented with periumbilical abdominal pain and was found to have appendicitis. Sepsis worsened despite antibiotics and surgery performed drainage of appendiceal abscess with drain placement 7/11. There was some perioperative hypotension and evidence of acute renal failure with ATN for which Nephrology was consulted.   He has continued with a slow recovery due to very little motivation to mobilize and advance diet. PT has recommended SNF and OT has signed off because patient refused to participate.  JP drain removed on 12/04/2017.  Zosyn stopped 12/05/2017.  Subjective: Reports he is feeling better today.  Denies n/v or diarrhea.  Denies cp or sob.  Is trying to eat more, and to be more mobil.    Assessment & Plan:  Sepsis due to acute appendiceal abscess - s/p drainage and drain placement 7/11  - Diet per Surgery - JP drain removed 7/19  Acute renal failure on CKD stage 2 - most c/w ATN - Renal U/S negative  - crt climbing again - volume resuscitate and follow   Recent Labs  Lab 12/02/17 0446 12/03/17 0450 12/04/17 0946 12/05/17 0331 12/06/17 0520  CREATININE 2.53* 1.96* 1.74* 1.76* 2.46*    Hypokalemia Replace - check Mg  Hypernatremia Cont free water expansion   CAD s/p PCI by Dr. [name] 04/06/2017 Maintained on DAPT - Dr. [name] evaluated patient - resume plavix   Acute blood loss anemia on anemia of chronic disease Improved s/p 1u PRBCs 7/14 - Hgb trending down again - no gross blood loss evident - follow trend   HTN Continue metoprolol, bidil, norvasc  DM2 A1c 7.5  DVT prophylaxis: SCDs Code Status: FULL CODE Family Communication: no family present at time of exam  Disposition Plan:   Consultants:  Gen  Surgery  Cardiology Nephrology   Antimicrobials:  None since 7/19  Objective: Blood pressure (!) 142/78, pulse (!) 52, temperature 98.4 F (36.9 C), temperature source Oral, resp. rate 20, height 6' 2.02" (1.88 m), weight 99.8 kg (220 lb), SpO2 99 %.  Intake/Output Summary (Last 24 hours) at 12/06/2017 1159 Last data filed at 12/05/2017 1700 Gross per 24 hour  Intake 767.97 ml  Output -  Net 767.97 ml   Filed Weights   11/24/17 1004 11/26/17 1507  Weight: 99.8 kg (220 lb) 99.8 kg (220 lb)    Examination: General: No acute respiratory distress Lungs: Clear to auscultation bilaterally without wheezes or crackles Cardiovascular: Regular rate and rhythm without murmur gallop or rub normal S1 and S2 Abdomen: Nontender, nondistended, soft, bowel sounds positive, no rebound, no ascites, no appreciable mass Extremities: No significant cyanosis, clubbing, or edema bilateral lower extremities  CBC: Recent Labs  Lab 12/04/17 0946 12/05/17 0331 12/06/17 0520  WBC 13.2* 11.8* 9.5  HGB 9.0* 8.6* 7.9*  HCT 28.7* 27.6* 26.1*  MCV 104.4* 104.2* 107.4*  PLT 380 374 937   Basic Metabolic Panel: Recent Labs  Lab 11/30/17 0319  12/04/17 0946 12/05/17 0331 12/06/17 0520  NA 141   < > 146* 148* 146*  K 3.2*   < > 3.1* 2.9* 3.2*  CL 108   < > 111 112* 113*  CO2 23   < > 27 27 27   GLUCOSE 154*   < > 116* 85 49*  BUN 49*   < > 10 12 18   CREATININE 5.38*   < > 1.74* 1.76* 2.46*  CALCIUM 7.5*   < > 8.4* 8.3* 8.1*  MG 2.2  --   --   --   --    < > = values in this interval not displayed.   GFR: Estimated Creatinine Clearance: 35.3 mL/min (A) (by C-G formula based on SCr of 2.46 mg/dL (H)).  Liver Function Tests: No results for input(s): AST, ALT, ALKPHOS, BILITOT, PROT, ALBUMIN in the last 168 hours. No results for input(s): LIPASE, AMYLASE in the last 168 hours. No results for input(s): AMMONIA in the last 168 hours.  HbA1C: Hgb A1c MFr Bld  Date/Time Value Ref Range Status    11/24/2017 10:15 AM 7.5 (H) 4.8 - 5.6 % Final    Comment:    (NOTE)         Prediabetes: 5.7 - 6.4         Diabetes: >6.4         Glycemic control for adults with diabetes: <7.0   04/01/2017 06:50 PM 7.2 (H) 4.8 - 5.6 % Final    Comment:    (NOTE) Pre diabetes:          5.7%-6.4% Diabetes:              >6.4% Glycemic control for   <7.0% adults with diabetes     CBG: Recent Labs  Lab 12/05/17 2024 12/05/17 2255 12/06/17 0627 12/06/17 0656 12/06/17 0823  GLUCAP 81 72 43* 66* 94    Recent Results (from the past 240 hour(s))  Culture, Urine     Status: None   Collection Time: 11/28/17 10:30 AM  Result Value Ref Range Status   Specimen Description URINE, CLEAN CATCH  Final   Special Requests NONE  Final   Culture   Final    NO GROWTH Performed at [facility], [place] [address] [place], [place] [address]    Report Status 11/29/2017 FINAL  Final     Scheduled Meds: . amLODipine  5 mg Oral Daily  . aspirin  81 mg Oral Daily  . atorvastatin  40 mg Oral QHS  . dextrose  25 mL Intravenous Once  . dorzolamide-timolol  1 drop Both Eyes BID  . feeding supplement  1 Container Oral TID BM  . fluticasone  1 spray Each Nare Daily  . insulin aspart  0-9 Units Subcutaneous TID WC  . insulin detemir  30 Units Subcutaneous Daily  . isosorbide-hydrALAZINE  1 tablet Oral TID  . metoprolol succinate  100 mg Oral Daily  . saccharomyces boulardii  250 mg Oral BID   Continuous Infusions: . sodium chloride 10 mL/hr at 11/26/17 1509  . dextrose 5 % and 0.45% NaCl       LOS: 12 days   Cherene Altes, MD [facility] Office  [phone] Pager - Text Page per Amion  If 7PM-7AM, please contact night-coverage per Amion 12/06/2017, 11:59 AM

## 2017-12-06 NOTE — Plan of Care (Signed)
  Problem: Clinical Measurements: Goal: Ability to maintain clinical measurements within normal limits will improve Outcome: Progressing   Problem: Activity: Goal: Risk for activity intolerance will decrease Outcome: Progressing   Problem: Nutrition: Goal: Adequate nutrition will be maintained Outcome: Progressing   Problem: Coping: Goal: Level of anxiety will decrease Outcome: Progressing   Problem: Elimination: Goal: Will not experience complications related to bowel motility Outcome: Progressing Goal: Will not experience complications related to urinary retention Outcome: Progressing   Problem: Safety: Goal: Ability to remain free from injury will improve Outcome: Progressing   Problem: Pain Managment: Goal: General experience of comfort will improve Outcome: Progressing   Problem: Safety: Goal: Ability to remain free from injury will improve Outcome: Progressing   Problem: Skin Integrity: Goal: Risk for impaired skin integrity will decrease Outcome: Progressing

## 2017-12-06 NOTE — Progress Notes (Signed)
Hypoglycemic Event  CBG: 43  Treatment: 4 oz orange juice and graham crackers  Symptoms:asymptomatic  Follow-up CBG: [id number] CBG Result:66  Possible Reasons for Event: decrease food and oral intake  Comments/MD notified: Gave another cup of orange juice and endorse to oncoming RN   Adaline Sill Lyn A

## 2017-12-06 NOTE — Progress Notes (Signed)
Patient ID: David [name], male   DOB: June 28, 1945, 72 y.o.   MRN: [id number] 10 Days Post-Op   Subjective: States he feels better today.  No diarrhea since yesterday.  Denies nausea.  Ate most of his breakfast.  Denies pain.  Objective: Vital signs in last 24 hours: Temp:  [98.2 F (36.8 C)-98.4 F (36.9 C)] 98.4 F (36.9 C) (07/21 0428) Pulse Rate:  [52-65] 52 (07/21 0428) Resp:  [20] 20 (07/20 1354) BP: (132-148)/(67-89) 142/78 (07/21 0428) SpO2:  [99 %] 99 % (07/21 0428) Last BM Date: 12/05/17  Intake/Output from previous day: 07/20 0701 - 07/21 0700 In: 1008 [P.O.:720; IV Piggyback:288] Out: -  Intake/Output this shift: No intake/output data recorded.  General appearance: alert, cooperative and no distress GI: Mild diffuse tenderness without guarding.  Mild distention. Incision/Wound: Dressings clean and dry  Lab Results:  Recent Labs    12/05/17 0331 12/06/17 0520  WBC 11.8* 9.5  HGB 8.6* 7.9*  HCT 27.6* 26.1*  PLT 374 340   BMET Recent Labs    12/05/17 0331 12/06/17 0520  NA 148* 146*  K 2.9* 3.2*  CL 112* 113*  CO2 27 27  GLUCOSE 85 49*  BUN 12 18  CREATININE 1.76* 2.46*  CALCIUM 8.3* 8.1*     Studies/Results: Ct Abdomen Pelvis Wo Contrast  Result Date: 12/04/2017 CLINICAL DATA:  Perforated appendicitis.  Persistent pain. EXAM: CT ABDOMEN AND PELVIS WITHOUT CONTRAST TECHNIQUE: Multidetector CT imaging of the abdomen and pelvis was performed following the standard protocol without IV contrast. COMPARISON:  11/24/2017 FINDINGS: Lower chest: Atherosclerotic calcifications are noted involving the LAD, left circumflex and RCA coronary arteries. Hepatobiliary: There is no focal liver abnormality. The gallbladder appears normal. No biliary dilatation. Pancreas: Unremarkable. No pancreatic ductal dilatation or surrounding inflammatory changes. Spleen: Normal in size without focal abnormality. Adrenals/Urinary Tract: The adrenal glands are normal. No kidney  mass or hydronephrosis identified. Urinary bladder is unremarkable. Stomach/Bowel: The stomach appears nondistended. The proximal and mid small bowel loops are unremarkable. Again noted are extensive inflammatory changes involving the right lower quadrant small bowel loops. Changes include large and small bowel wall edema with diffuse surrounding fat stranding. Evaluation of this area is significantly diminished due to lack of both IV and oral contrast material. A right lower quadrant percutaneous drainage catheter is identified which terminates in the central pelvis. There is a calcification within the central pelvis measuring 7 mm which in the setting of recent ruptured appendicitis may represent an appendicolith. No large fluid collections identified. Vascular/Lymphatic: No significant vascular findings are present. No enlarged abdominal or pelvic lymph nodes. Reproductive: Prostate is unremarkable. Other: Diffuse fat stranding and mesenteric edema is identified. Small amount of free fluid noted within the dependent portion of the pelvis. Musculoskeletal: Degenerative disc disease noted within the lumbar spine. Previous right hip arthroplasty. IMPRESSION: 1. Evaluation of right lower quadrant inflammatory changes is significantly diminished due to lack of IV and oral contrast material. Consider further evaluation with contrast enhanced CT of the abdomen and pelvis following oral contrast administration to assess patency of bowel, rule out abscess, and evaluate for fistula. 2. Again noted are extensive inflammatory changes within the right lower quadrant of the abdomen. As mentioned on previous studies findings may represent the sequelae of ruptured appendicitis. Differential considerations include inflammatory/infectious enteritis. 3. Calcification in the right side of pelvis is noted which in the setting of ruptured appendicitis may represent and appendicolith Electronically Signed   By: Queen Slough.D.  On: 12/04/2017 11:48    Anti-infectives: Anti-infectives (From admission, onward)   Start     Dose/Rate Route Frequency Ordered Stop   12/02/17 1100  piperacillin-tazobactam (ZOSYN) IVPB 3.375 g  Status:  Discontinued     3.375 g 12.5 mL/hr over 240 Minutes Intravenous Every 8 hours 12/02/17 0959 12/05/17 0934   11/28/17 1900  piperacillin-tazobactam (ZOSYN) IVPB 2.25 g  Status:  Discontinued     2.25 g 100 mL/hr over 30 Minutes Intravenous Every 8 hours 11/28/17 1602 12/02/17 0959   11/25/17 0300  piperacillin-tazobactam (ZOSYN) IVPB 3.375 g  Status:  Discontinued     3.375 g 12.5 mL/hr over 240 Minutes Intravenous Every 8 hours 11/24/17 1951 11/28/17 1602   11/24/17 1930  piperacillin-tazobactam (ZOSYN) IVPB 3.375 g     3.375 g 100 mL/hr over 30 Minutes Intravenous  Once 11/24/17 1924 11/24/17 2229   11/24/17 1330  cefTRIAXone (ROCEPHIN) 2 g in sodium chloride 0.9 % 100 mL IVPB     2 g 200 mL/hr over 30 Minutes Intravenous  Once 11/24/17 1327 11/24/17 1456   11/24/17 1330  metroNIDAZOLE (FLAGYL) IVPB 500 mg     500 mg 100 mL/hr over 60 Minutes Intravenous  Once 11/24/17 1327 11/24/17 1658      Assessment/Plan: s/p Procedure(s): LAPAROSCOPIC ABDOMINAL EXPLORATION, DRAINAGE OF APPENDICEAL ABCESS. PLACEMENT OF DRAIN Now 12 days postoperative laparoscopic exploration and drainage of appendiceal abscess.  Off IV antibiotics.  Drain removed yesterday.  Had diarrhea that is improved.  Seems to be tolerating diet.  Hopefully home in the next day or 2.   LOS: 12 days    Edward Jolly 12/06/2017

## 2017-12-07 LAB — MAGNESIUM: MAGNESIUM: 1.7 mg/dL (ref 1.7–2.4)

## 2017-12-07 LAB — CBC
HEMATOCRIT: 25.6 % — AB (ref 39.0–52.0)
Hemoglobin: 8 g/dL — ABNORMAL LOW (ref 13.0–17.0)
MCH: 33.1 pg (ref 26.0–34.0)
MCHC: 31.3 g/dL (ref 30.0–36.0)
MCV: 105.8 fL — ABNORMAL HIGH (ref 78.0–100.0)
Platelets: 305 10*3/uL (ref 150–400)
RBC: 2.42 MIL/uL — ABNORMAL LOW (ref 4.22–5.81)
RDW: 12.8 % (ref 11.5–15.5)
WBC: 10.1 10*3/uL (ref 4.0–10.5)

## 2017-12-07 LAB — GLUCOSE, CAPILLARY
GLUCOSE-CAPILLARY: 140 mg/dL — AB (ref 70–99)
GLUCOSE-CAPILLARY: 199 mg/dL — AB (ref 70–99)
GLUCOSE-CAPILLARY: 143 mg/dL — AB (ref 70–99)
GLUCOSE-CAPILLARY: 136 mg/dL — AB (ref 70–99)

## 2017-12-07 LAB — BASIC METABOLIC PANEL
Anion gap: 5 (ref 5–15)
BUN: 21 mg/dL (ref 8–23)
CHLORIDE: 109 mmol/L (ref 98–111)
CO2: 27 mmol/L (ref 22–32)
CREATININE: 2.23 mg/dL — AB (ref 0.61–1.24)
Calcium: 7.9 mg/dL — ABNORMAL LOW (ref 8.9–10.3)
GFR calc Af Amer: 33 mL/min — ABNORMAL LOW (ref >60)
GFR calc non Af Amer: 28 mL/min — ABNORMAL LOW (ref >60)
GLUCOSE: 170 mg/dL — AB (ref 70–99)
Potassium: 3.8 mmol/L (ref 3.5–5.1)
SODIUM: 141 mmol/L (ref 135–145)

## 2017-12-07 LAB — IRON AND TIBC
Iron: 46 ug/dL (ref 45–182)
Saturation Ratios: 27 % (ref 17.9–39.5)
TIBC: 171 ug/dL — ABNORMAL LOW (ref 250–450)
UIBC: 125 ug/dL

## 2017-12-07 LAB — FERRITIN: FERRITIN: 180 ng/mL (ref 24–336)

## 2017-12-07 LAB — RETICULOCYTES
RBC.: 2.42 MIL/uL — ABNORMAL LOW (ref 4.22–5.81)
RETIC COUNT ABSOLUTE: 75 10*3/uL (ref 19.0–186.0)
RETIC CT PCT: 3.1 % (ref 0.4–3.1)

## 2017-12-07 LAB — VITAMIN B12: Vitamin B-12: 610 pg/mL (ref 180–914)

## 2017-12-07 LAB — FOLATE: FOLATE: 8.8 ng/mL (ref >5.9)

## 2017-12-07 MED ORDER — CLOPIDOGREL BISULFATE 75 MG PO TABS
75.0000 mg | ORAL_TABLET | Freq: Every day | ORAL | Status: DC
  Administered 2017-12-07 – 2017-12-09 (×3): 75 mg via ORAL
  Filled 2017-12-07 (×3): qty 1

## 2017-12-07 MED ORDER — AMLODIPINE BESYLATE 10 MG PO TABS
10.0000 mg | ORAL_TABLET | Freq: Every day | ORAL | Status: DC
Start: 2017-12-08 — End: 2017-12-09
  Administered 2017-12-08 – 2017-12-09 (×2): 10 mg via ORAL
  Filled 2017-12-07 (×2): qty 1

## 2017-12-07 NOTE — Discharge Instructions (Signed)
Please arrive at least 30 min before your appointment to complete your check in paperwork.  If you are unable to arrive 30 min prior to your appointment time we may have to cancel or reschedule you. ° °LAPAROSCOPIC SURGERY: POST OP INSTRUCTIONS  °1. DIET: Follow a light bland diet the first 24 hours after arrival home, such as soup, liquids, crackers, etc. Be sure to include lots of fluids daily. Avoid fast food or heavy meals as your are more likely to get nauseated. Eat a low fat the next few days after surgery.  °2. Take your usually prescribed home medications unless otherwise directed. °3. PAIN CONTROL:  °1. Pain is best controlled by a usual combination of three different methods TOGETHER:  °1. Ice/Heat °2. Over the counter pain medication °3. Prescription pain medication °2. Most patients will experience some swelling and bruising around the incisions. Ice packs or heating pads (30-60 minutes up to 6 times a day) will help. Use ice for the first few days to help decrease swelling and bruising, then switch to heat to help relax tight/sore spots and speed recovery. Some people prefer to use ice alone, heat alone, alternating between ice & heat. Experiment to what works for you. Swelling and bruising can take several weeks to resolve.  °3. It is helpful to take an over-the-counter pain medication regularly for the first few weeks. Choose one of the following that works best for you:  °1. Naproxen (Aleve, etc) Two 220mg tabs twice a day °2. Ibuprofen (Advil, etc) Three 200mg tabs four times a day (every meal & bedtime) °3. Acetaminophen (Tylenol, etc) 500-650mg four times a day (every meal & bedtime) °4. A prescription for pain medication (such as oxycodone, hydrocodone, etc) should be given to you upon discharge. Take your pain medication as prescribed.  °1. If you are having problems/concerns with the prescription medicine (does not control pain, nausea, vomiting, rash, itching, etc), please call us [phone] to see if we need to switch you to a different pain medicine that will work better for you and/or control your side effect better. °2. If you need a refill on your pain medication, please contact your pharmacy. They will contact our office to request authorization. Prescriptions will not be filled after 5 pm or on week-ends. °4. Avoid getting constipated. Between the surgery and the pain medications, it is common to experience some constipation. Increasing fluid intake and taking a fiber supplement (such as Metamucil, Citrucel, FiberCon, MiraLax, etc) 1-2 times a day regularly will usually help prevent this problem from occurring. A mild laxative (prune juice, Milk of Magnesia, MiraLax, etc) should be taken according to package directions if there are no bowel movements after 48 hours.  °5. Watch out for diarrhea. If you have many loose bowel movements, simplify your diet to bland foods & liquids for a few days. Stop any stool softeners and decrease your fiber supplement. Switching to mild anti-diarrheal medications (Kayopectate, Pepto Bismol) can help. If this worsens or does not improve, please call us. °6. Wash / shower every day. You may shower over the dressings as they are waterproof. Continue to shower over incision(s) after the dressing is off. °7. Remove your waterproof bandages 5 days after surgery. You may leave the incision open to air. You may replace a dressing/Band-Aid to cover the incision for comfort if you wish.  °8. ACTIVITIES as tolerated:  °1. You may resume regular (light) daily activities beginning the next day--such as daily self-care, walking, climbing stairs--gradually   increasing activities as tolerated. If you can walk 30 minutes without difficulty, it is safe to try more intense activity such as jogging, treadmill, bicycling, low-impact aerobics, swimming, etc. °2. Save the most intensive and strenuous activity for last such as sit-ups, heavy lifting, contact sports, etc Refrain  from any heavy lifting or straining until you are off narcotics for pain control.  °3. DO NOT PUSH THROUGH PAIN. Let pain be your guide: If it hurts to do something, don't do it. Pain is your body warning you to avoid that activity for another week until the pain goes down. °4. You may drive when you are no longer taking prescription pain medication, you can comfortably wear a seatbelt, and you can safely maneuver your car and apply brakes. °5. You may have sexual intercourse when it is comfortable.  °9. FOLLOW UP in our office  °1. Please call [facility] at [phone] to set up an appointment to see your surgeon in the office for a follow-up appointment approximately 2-3 weeks after your surgery. °2. Make sure that you call for this appointment the day you arrive home to insure a convenient appointment time. °     10. IF YOU HAVE DISABILITY OR FAMILY LEAVE FORMS, BRING THEM TO THE               OFFICE FOR PROCESSING.  ° °WHEN TO CALL US [phone]:  °1. Poor pain control °2. Reactions / problems with new medications (rash/itching, nausea, etc)  °3. Fever over 101.5 F (38.5 C) °4. Inability to urinate °5. Nausea and/or vomiting °6. Worsening swelling or bruising °7. Continued bleeding from incision. °8. Increased pain, redness, or drainage from the incision ° °The clinic staff is available to answer your questions during regular business hours (8:30am-5pm). Please don’t hesitate to call and ask to speak to one of our nurses for clinical concerns.  °If you have a medical emergency, go to the nearest emergency room or call 911.  °A surgeon from [facility] is always on call at the hospitals  ° °[facility]  °[address], [address], [place], [place] [address] ?  °MAIN: [phone] ? TOLL FREE: [phone] ?  °FAX [phone]  °[email or web address] ° °

## 2017-12-07 NOTE — Progress Notes (Signed)
Physical Therapy Treatment Patient Details Name: David [name] MRN: [id number] DOB: 04-29-1946 Today's Date: 12/07/2017    History of Present Illness Pt is a 72 y/o male admitted secondary to worsening abdominal pain and constipation. Pt is s/p laparascopic abdominal exploration and drainage of appendiceal abscess with placement of drain on 7/11. PMH includes HTN, DM, CAD s/p stent placement, CHF, R THA, CKD, and back surgery per pt report.     PT Comments    Pt performed gait training and sit to stand transfer during session this pm.  Pt is slow and guarded during gait with narrow BOS with slight scissoring.  Pt's gait speed remains decreased and he is slow to process commands.   Pt fatigued after gt training and requesting to rest.  He remains to make slow progress.  He reports he feels strong enough to start OT now.      Follow Up Recommendations  SNF;Supervision/Assistance - 24 hour     Equipment Recommendations  3in1 (PT)    Recommendations for Other Services       Precautions / Restrictions Precautions Precautions: Fall Precaution Comments: JP drain d/c'd on the 19th.   Restrictions Weight Bearing Restrictions: No    Mobility  Bed Mobility               General bed mobility comments: Pt sitting in recliner on arrival.    Transfers Overall transfer level: Needs assistance Equipment used: Rolling walker (2 wheeled) Transfers: Sit to/from Stand Sit to Stand: Min assist         General transfer comment: Cues for hand placement to and from seated surface, min assistance to boost into standing  Ambulation/Gait Ambulation/Gait assistance: Min guard Gait Distance (Feet): 120 Feet Assistive device: Rolling walker (2 wheeled) Gait Pattern/deviations: Step-through pattern;Narrow base of support;Decreased stride length;Trunk flexed Gait velocity: Decreased    General Gait Details: Cues for B hip extension and trunk extension in standing.  Pt required cues to  increase BOS and to maintain safe position with RW.  Pt required several rest breaks due to fatigue.     Stairs             Wheelchair Mobility    Modified Rankin (Stroke Patients Only)       Balance Overall balance assessment: Needs assistance Sitting-balance support: No upper extremity supported;Feet supported Sitting balance-Leahy Scale: Fair       Standing balance-Leahy Scale: Poor Standing balance comment: Reliant on BUE support and external support.                             Cognition Arousal/Alertness: Awake/alert Behavior During Therapy: Flat affect Overall Cognitive Status: Impaired/Different from baseline Area of Impairment: Safety/judgement;Problem solving                       Following Commands: Follows one step commands with increased time Safety/Judgement: Decreased awareness of deficits;Decreased awareness of safety   Problem Solving: Requires verbal cues;Slow processing        Exercises      General Comments        Pertinent Vitals/Pain Pain Assessment: No/denies pain    Home Living                      Prior Function            PT Goals (current goals can now be found in the care plan  section) Acute Rehab PT Goals Patient Stated Goal: to feel better Potential to Achieve Goals: Fair Progress towards PT goals: Progressing toward goals    Frequency    Min 2X/week      PT Plan Current plan remains appropriate    Co-evaluation              AM-PAC PT "6 Clicks" Daily Activity  Outcome Measure  Difficulty turning over in bed (including adjusting bedclothes, sheets and blankets)?: Unable Difficulty moving from lying on back to sitting on the side of the bed? : Unable Difficulty sitting down on and standing up from a chair with arms (e.g., wheelchair, bedside commode, etc,.)?: Unable Help needed moving to and from a bed to chair (including a wheelchair)?: A Little Help needed walking in  hospital room?: A Little Help needed climbing 3-5 steps with a railing? : A Little 6 Click Score: 12    End of Session Equipment Utilized During Treatment: Gait belt Activity Tolerance: Patient limited by pain;Patient limited by fatigue Patient left: in chair;with call bell/phone within reach;with chair alarm set;with family/visitor present Nurse Communication: Mobility status PT Visit Diagnosis: Other abnormalities of gait and mobility (R26.89);Unsteadiness on feet (R26.81);Muscle weakness (generalized) (M62.81);Pain Pain - part of body: (abdomen)     Time: 6010-9323 PT Time Calculation (min) (ACUTE ONLY): 17 min  Charges:  $Gait Training: 8-22 mins                    G Codes:       [name], PTA pager [phone]    [name] 12/07/2017, 3:04 PM

## 2017-12-07 NOTE — Progress Notes (Signed)
David [name]  INO:[id number] DOB: Oct 03, 1945 DOA: 11/24/2017 PCP: [name], MD    Brief Narrative:  72 y.o. male with a history of CAD s/p stenting Nov 2018 on DAPT, previous CHF with most recent echo normal, DM2, stage II CKD, and HTN who presented with periumbilical abdominal pain and was found to have appendicitis. Sepsis worsened despite antibiotics and Surgery performed drainage of appendiceal abscess with drain placement 7/11. There was some perioperative hypotension and evidence of acute renal failure with ATN for which Nephrology was consulted.   He has continued with a slow recovery due to very little motivation to mobilize and advance diet. PT has recommended SNF and OT has signed off because patient refused to participate.  JP drain removed on 12/04/2017.  Zosyn stopped 12/05/2017.  Subjective: Says he feels tired today.  Reports decreased appetite this morning.  Denies cp, n/v, or HA.  Reports some vague mild abdom discomfort.    Assessment & Plan:  Sepsis due to acute appendiceal abscess - s/p drainage and drain placement 7/11  - now on full soft diet  - JP drain removed 7/19 - antibiotics stopped 7/20  - remains afebrile w/ improving dietary intake   Acute renal failure on CKD stage 2 - most c/w ATN - Renal U/S negative  - crt has improved w/ volume expansion over last 24hrs - stop IVF today and f/u in AM   Recent Labs  Lab 12/03/17 0450 12/04/17 0946 12/05/17 0331 12/06/17 0520 12/07/17 0428  CREATININE 1.96* 1.74* 1.76* 2.46* 2.23*    Hypokalemia Corrected - Mg is in normal range   Hypernatremia Corrected w/  free water expansion   CAD s/p PCI by Dr. [name] 04/06/2017 Maintained on DAPT - Dr. [name] evaluated patient - resume plavix today and follow Hgb   Acute blood loss anemia on anemia of chronic disease Improved s/p 1u PRBCs 7/14 - Hgb appears to be holding steady at ~8 - no gross blood loss   Recent Labs  Lab 12/03/17 0450  12/04/17 0946 12/05/17 0331 12/06/17 0520 12/07/17 0428  HGB 8.3* 9.0* 8.6* 7.9* 8.0*     HTN BP not at goal - adjust tx and follow   DM2 A1c 7.5 - CBG currently controlled   DVT prophylaxis: SCDs Code Status: FULL CODE Family Communication: no family present at time of exam  Disposition Plan: anticipate d/c home in 24-48hrs if Hgb and crt stable / intake consistent   Consultants:  Gen Surgery  Cardiology Nephrology   Antimicrobials:  None since 7/19  Objective: Blood pressure (!) 162/85, pulse 67, temperature 98.4 F (36.9 C), temperature source Oral, resp. rate 18, height 6' 2.02" (1.88 m), weight 99.8 kg (220 lb), SpO2 98 %.  Intake/Output Summary (Last 24 hours) at 12/07/2017 1023 Last data filed at 12/07/2017 0849 Gross per 24 hour  Intake 624.47 ml  Output 5 ml  Net 619.47 ml   Filed Weights   11/24/17 1004 11/26/17 1507  Weight: 99.8 kg (220 lb) 99.8 kg (220 lb)    Examination: General: No acute respiratory distress Lungs: CTA B - no wheezing  Cardiovascular: RRR - no M or rub  Abdomen: Nontender, nondistended, soft, bowel sounds positive, no rebound Extremities: No C/C/E B LE   CBC: Recent Labs  Lab 12/05/17 0331 12/06/17 0520 12/07/17 0428  WBC 11.8* 9.5 10.1  HGB 8.6* 7.9* 8.0*  HCT 27.6* 26.1* 25.6*  MCV 104.2* 107.4* 105.8*  PLT 374 340 096   Basic Metabolic Panel:  Recent Labs  Lab 12/05/17 0331 12/06/17 0520 12/07/17 0428  NA 148* 146* 141  K 2.9* 3.2* 3.8  CL 112* 113* 109  CO2 27 27 27   GLUCOSE 85 49* 170*  BUN 12 18 21   CREATININE 1.76* 2.46* 2.23*  CALCIUM 8.3* 8.1* 7.9*  MG  --   --  1.7   GFR: Estimated Creatinine Clearance: 38.9 mL/min (A) (by C-G formula based on SCr of 2.23 mg/dL (H)).  Liver Function Tests: No results for input(s): AST, ALT, ALKPHOS, BILITOT, PROT, ALBUMIN in the last 168 hours. No results for input(s): LIPASE, AMYLASE in the last 168 hours. No results for input(s): AMMONIA in the last 168  hours.  HbA1C: Hgb A1c MFr Bld  Date/Time Value Ref Range Status  11/24/2017 10:15 AM 7.5 (H) 4.8 - 5.6 % Final    Comment:    (NOTE)         Prediabetes: 5.7 - 6.4         Diabetes: >6.4         Glycemic control for adults with diabetes: <7.0   04/01/2017 06:50 PM 7.2 (H) 4.8 - 5.6 % Final    Comment:    (NOTE) Pre diabetes:          5.7%-6.4% Diabetes:              >6.4% Glycemic control for   <7.0% adults with diabetes     CBG: Recent Labs  Lab 12/06/17 0823 12/06/17 1159 12/06/17 1533 12/06/17 2146 12/07/17 0647  GLUCAP 94 101* 164* 147* 140*    Recent Results (from the past 240 hour(s))  Culture, Urine     Status: None   Collection Time: 11/28/17 10:30 AM  Result Value Ref Range Status   Specimen Description URINE, CLEAN CATCH  Final   Special Requests NONE  Final   Culture   Final    NO GROWTH Performed at [facility], [place] [address], [place], [place] [address]    Report Status 11/29/2017 FINAL  Final     Scheduled Meds: . amLODipine  5 mg Oral Daily  . aspirin  81 mg Oral Daily  . atorvastatin  40 mg Oral QHS  . dorzolamide-timolol  1 drop Both Eyes BID  . feeding supplement  1 Container Oral TID BM  . fluticasone  1 spray Each Nare Daily  . insulin aspart  0-9 Units Subcutaneous TID WC  . insulin detemir  20 Units Subcutaneous Daily  . isosorbide-hydrALAZINE  1 tablet Oral TID  . metoprolol succinate  100 mg Oral Daily  . saccharomyces boulardii  250 mg Oral BID   Continuous Infusions: . dextrose 5 % and 0.45% NaCl 125 mL/hr at 12/07/17 0604     LOS: 13 days   Cherene Altes, MD [facility] Office  [phone] Pager - Text Page per Shea Evans  If 7PM-7AM, please contact night-coverage per Amion 12/07/2017, 10:23 AM

## 2017-12-07 NOTE — Progress Notes (Signed)
[facility] Progress Note  11 Days Post-Op  Subjective: CC: sleepy this AM Patient resting comfortably. States he feels like abdomen still a little bloated but better than previously. No longer having diarrhea, but is having bowel function. Was able to eat a little more yesterday.   Objective: Vital signs in last 24 hours: Temp:  [98.4 F (36.9 C)] 98.4 F (36.9 C) (07/22 0554) Pulse Rate:  [57-70] 67 (07/22 0554) Resp:  [18] 18 (07/21 1344) BP: (124-162)/(69-85) 162/85 (07/22 0554) SpO2:  [98 %-100 %] 98 % (07/22 0554) Last BM Date: 12/05/17  Intake/Output from previous day: 07/21 0701 - 07/22 0700 In: 744.5 [P.O.:720; I.V.:18.2; IV Piggyback:6.3] Out: 6 [Urine:6] Intake/Output this shift: Total I/O In: 120 [P.O.:120] Out: -   PE: Gen:  Alert, NAD, pleasant Card:  Regular rate and rhythm Pulm:  Normal effort, clear to auscultation bilaterally Abd: Soft, mildly TTP in RLQ, mildly distended, bowel sounds present, no HSM, incisions C/D/I Skin: warm and dry, no rashes  Psych: A&Ox3   Lab Results:  Recent Labs    12/06/17 0520 12/07/17 0428  WBC 9.5 10.1  HGB 7.9* 8.0*  HCT 26.1* 25.6*  PLT 340 305   BMET Recent Labs    12/06/17 0520 12/07/17 0428  NA 146* 141  K 3.2* 3.8  CL 113* 109  CO2 27 27  GLUCOSE 49* 170*  BUN 18 21  CREATININE 2.46* 2.23*  CALCIUM 8.1* 7.9*   PT/INR No results for input(s): LABPROT, INR in the last 72 hours. CMP     Component Value Date/Time   NA 141 12/07/2017 0428   K 3.8 12/07/2017 0428   CL 109 12/07/2017 0428   CO2 27 12/07/2017 0428   GLUCOSE 170 (H) 12/07/2017 0428   BUN 21 12/07/2017 0428   CREATININE 2.23 (H) 12/07/2017 0428   CALCIUM 7.9 (L) 12/07/2017 0428   PROT 6.9 11/24/2017 1015   ALBUMIN 2.7 (L) 11/24/2017 1015   AST 39 11/24/2017 1015   ALT 33 11/24/2017 1015   ALKPHOS 94 11/24/2017 1015   BILITOT 0.7 11/24/2017 1015   GFRNONAA 28 (L) 12/07/2017 0428   GFRAA 33 (L) 12/07/2017 0428    Lipase     Component Value Date/Time   LIPASE 24 11/24/2017 1015       Studies/Results: No results found.  Anti-infectives: Anti-infectives (From admission, onward)   Start     Dose/Rate Route Frequency Ordered Stop   12/02/17 1100  piperacillin-tazobactam (ZOSYN) IVPB 3.375 g  Status:  Discontinued     3.375 g 12.5 mL/hr over 240 Minutes Intravenous Every 8 hours 12/02/17 0959 12/05/17 0934   11/28/17 1900  piperacillin-tazobactam (ZOSYN) IVPB 2.25 g  Status:  Discontinued     2.25 g 100 mL/hr over 30 Minutes Intravenous Every 8 hours 11/28/17 1602 12/02/17 0959   11/25/17 0300  piperacillin-tazobactam (ZOSYN) IVPB 3.375 g  Status:  Discontinued     3.375 g 12.5 mL/hr over 240 Minutes Intravenous Every 8 hours 11/24/17 1951 11/28/17 1602   11/24/17 1930  piperacillin-tazobactam (ZOSYN) IVPB 3.375 g     3.375 g 100 mL/hr over 30 Minutes Intravenous  Once 11/24/17 1924 11/24/17 2229   11/24/17 1330  cefTRIAXone (ROCEPHIN) 2 g in sodium chloride 0.9 % 100 mL IVPB     2 g 200 mL/hr over 30 Minutes Intravenous  Once 11/24/17 1327 11/24/17 1456   11/24/17 1330  metroNIDAZOLE (FLAGYL) IVPB 500 mg     500 mg 100 mL/hr over 60  Minutes Intravenous  Once 11/24/17 1327 11/24/17 1658       Assessment/Plan HTN HLD CAD s/p PCI 2018 Acute on chronic anemia -hgb8.0today DMT2,uncontrolled - A1C 7.5%, SSI CKD with acute renal failure - nephrology consulted,suspect ATN,Cr2.23 today Chronic back pain  Acute appendicitis S/pLAPAROSCOPIC ABDOMINAL EXPLORATION, DRAINAGE OF APPENDICEAL ABCESS. PLACEMENT OF DRAIN7/11 Dr. Lucia Gaskins - POD11, afebrile, VSS, WBCstable  -drain removed 7/19 -cont probiotics given diarrhea. C diff negative. Imodium prn - tolerating diet and having bowel function  Hypokalemia -  K 3.8, improved   ID -zosyn 7/9>>7/19 FEN -IVF,soft diet, Ensure VTE -SCDs Foley -none  Plan: stable for discharge from a surgery standpoint. Will make sure  f/u is in the chart.     LOS: 13 days    Brigid Re , [facility] 12/07/2017, 10:13 AM Pager: [phone] Consults: [phone] Mon-Fri 7:00 am-4:30 pm Sat-Sun 7:00 am-11:30 am

## 2017-12-07 NOTE — Progress Notes (Signed)
PT Cancellation Note  Patient Details Name: David [name] MRN: [id number] DOB: Nov 08, 1945   Cancelled Treatment:    Reason Eval/Treat Not Completed: (P) Patient declined, no reason specified(reports he wants to finish lunch then he will be ready to participate.  Will f/u this pm.  )   [name] 12/07/2017, 12:53 PM  [name], PTA pager [phone]

## 2017-12-08 LAB — GLUCOSE, CAPILLARY
GLUCOSE-CAPILLARY: 84 mg/dL (ref 70–99)
Glucose-Capillary: 117 mg/dL — ABNORMAL HIGH (ref 70–99)
Glucose-Capillary: 129 mg/dL — ABNORMAL HIGH (ref 70–99)
Glucose-Capillary: 155 mg/dL — ABNORMAL HIGH (ref 70–99)

## 2017-12-08 LAB — BASIC METABOLIC PANEL
Anion gap: 6 (ref 5–15)
BUN: 25 mg/dL — AB (ref 8–23)
CALCIUM: 8.1 mg/dL — AB (ref 8.9–10.3)
CHLORIDE: 108 mmol/L (ref 98–111)
CO2: 25 mmol/L (ref 22–32)
CREATININE: 2.17 mg/dL — AB (ref 0.61–1.24)
GFR calc Af Amer: 34 mL/min — ABNORMAL LOW (ref >60)
GFR calc non Af Amer: 29 mL/min — ABNORMAL LOW (ref >60)
Glucose, Bld: 93 mg/dL (ref 70–99)
Potassium: 3.7 mmol/L (ref 3.5–5.1)
SODIUM: 139 mmol/L (ref 135–145)

## 2017-12-08 LAB — CBC
HCT: 26.4 % — ABNORMAL LOW (ref 39.0–52.0)
Hemoglobin: 8 g/dL — ABNORMAL LOW (ref 13.0–17.0)
MCH: 32.1 pg (ref 26.0–34.0)
MCHC: 30.3 g/dL (ref 30.0–36.0)
MCV: 106 fL — AB (ref 78.0–100.0)
PLATELETS: 306 10*3/uL (ref 150–400)
RBC: 2.49 MIL/uL — ABNORMAL LOW (ref 4.22–5.81)
RDW: 13 % (ref 11.5–15.5)
WBC: 9.9 10*3/uL (ref 4.0–10.5)

## 2017-12-08 MED ORDER — ISOSORB DINITRATE-HYDRALAZINE 20-37.5 MG PO TABS
2.0000 | ORAL_TABLET | Freq: Three times a day (TID) | ORAL | Status: DC
  Administered 2017-12-08 – 2017-12-09 (×3): 2 via ORAL
  Filled 2017-12-08 (×4): qty 2

## 2017-12-08 NOTE — Progress Notes (Signed)
Patient ID: David [name], male   DOB: 01/19/1946, 72 y.o.   MRN: [id number]    12 Days Post-Op  Subjective: Pt saddened by his diarrhea returning some today.  He is eating some solid food though.  JP drain site with no further serous output currently.  Objective: Vital signs in last 24 hours: Temp:  [97.8 F (36.6 C)-98.3 F (36.8 C)] 98.3 F (36.8 C) (07/23 0408) Pulse Rate:  [63-65] 65 (07/23 0408) Resp:  [16] 16 (07/23 0408) BP: (162-169)/(87-95) 169/87 (07/23 0408) SpO2:  [99 %-100 %] 99 % (07/23 0408) Last BM Date: 12/05/17  Intake/Output from previous day: 07/22 0701 - 07/23 0700 In: 3297.1 [P.O.:600; I.V.:2697.1] Out: 1150 [Urine:1150] Intake/Output this shift: Total I/O In: 120 [P.O.:120] Out: -   PE: Abd: soft, appropriately tender, +BS, all incisions are well-healed.  JP drain site with no further drainage.  Lab Results:  Recent Labs    12/07/17 0428 12/08/17 0536  WBC 10.1 9.9  HGB 8.0* 8.0*  HCT 25.6* 26.4*  PLT 305 306   BMET Recent Labs    12/07/17 0428 12/08/17 0536  NA 141 139  K 3.8 3.7  CL 109 108  CO2 27 25  GLUCOSE 170* 93  BUN 21 25*  CREATININE 2.23* 2.17*  CALCIUM 7.9* 8.1*   PT/INR No results for input(s): LABPROT, INR in the last 72 hours. CMP     Component Value Date/Time   NA 139 12/08/2017 0536   K 3.7 12/08/2017 0536   CL 108 12/08/2017 0536   CO2 25 12/08/2017 0536   GLUCOSE 93 12/08/2017 0536   BUN 25 (H) 12/08/2017 0536   CREATININE 2.17 (H) 12/08/2017 0536   CALCIUM 8.1 (L) 12/08/2017 0536   PROT 6.9 11/24/2017 1015   ALBUMIN 2.7 (L) 11/24/2017 1015   AST 39 11/24/2017 1015   ALT 33 11/24/2017 1015   ALKPHOS 94 11/24/2017 1015   BILITOT 0.7 11/24/2017 1015   GFRNONAA 29 (L) 12/08/2017 0536   GFRAA 34 (L) 12/08/2017 0536   Lipase     Component Value Date/Time   LIPASE 24 11/24/2017 1015       Studies/Results: No results found.  Anti-infectives: Anti-infectives (From admission, onward)   Start     Dose/Rate Route Frequency Ordered Stop   12/02/17 1100  piperacillin-tazobactam (ZOSYN) IVPB 3.375 g  Status:  Discontinued     3.375 g 12.5 mL/hr over 240 Minutes Intravenous Every 8 hours 12/02/17 0959 12/05/17 0934   11/28/17 1900  piperacillin-tazobactam (ZOSYN) IVPB 2.25 g  Status:  Discontinued     2.25 g 100 mL/hr over 30 Minutes Intravenous Every 8 hours 11/28/17 1602 12/02/17 0959   11/25/17 0300  piperacillin-tazobactam (ZOSYN) IVPB 3.375 g  Status:  Discontinued     3.375 g 12.5 mL/hr over 240 Minutes Intravenous Every 8 hours 11/24/17 1951 11/28/17 1602   11/24/17 1930  piperacillin-tazobactam (ZOSYN) IVPB 3.375 g     3.375 g 100 mL/hr over 30 Minutes Intravenous  Once 11/24/17 1924 11/24/17 2229   11/24/17 1330  cefTRIAXone (ROCEPHIN) 2 g in sodium chloride 0.9 % 100 mL IVPB     2 g 200 mL/hr over 30 Minutes Intravenous  Once 11/24/17 1327 11/24/17 1456   11/24/17 1330  metroNIDAZOLE (FLAGYL) IVPB 500 mg     500 mg 100 mL/hr over 60 Minutes Intravenous  Once 11/24/17 1327 11/24/17 1658       Assessment/Plan HTN HLD CAD s/p PCI 2018 Acute on chronic anemia -hgb8.0today  DMT2,uncontrolled - A1C 7.5%, SSI CKD with acute renal failure - nephrology consulted,suspect ATN,Cr2.17 today Chronic back pain  Acute appendicitis S/pLAPAROSCOPIC ABDOMINAL EXPLORATION, DRAINAGE OF APPENDICEAL ABCESS. PLACEMENT OF DRAIN7/11 Dr. Lucia Gaskins - POD12, afebrile, VSS, WBCstable  -drainremoved 7/19, serous drainage has stopped -cont probiotics given diarrhea. C diff negative.Imodium prn - tolerating diet and having bowel function  Hypokalemia -  K 3.7, improved   ID -zosyn 7/9>>7/19 FEN -IVF,soft diet, Ensure VTE -SCDs Foley -none  Plan: stable for discharge from a surgery standpoint. f/u is in the chart.      LOS: 14 days    Henreitta Cea , [facility] 12/08/2017, 10:32 AM Pager: [phone]

## 2017-12-08 NOTE — Progress Notes (Signed)
David [name]  OFB:[id number] DOB: [birth year] DOA: 11/24/2017 PCP: [name], MD    Brief Narrative:  72 y.o. male with a history of CAD s/p stenting Nov 2018 on DAPT, previous CHF with most recent echo normal, DM2, stage II CKD, and HTN who presented with periumbilical abdominal pain and was found to have appendicitis. Sepsis worsened despite antibiotics and Surgery performed drainage of appendiceal abscess with drain placement 7/11. There was some perioperative hypotension and evidence of acute renal failure with ATN for which Nephrology was consulted.   He has continued with a slow recovery due to very little motivation to mobilize and advance diet. PT has recommended SNF and OT has signed off because patient refused to participate.  JP drain removed on 12/04/2017.  Zosyn stopped 12/05/2017.  Subjective: Pt reports the acute onset of multiple loose stools this morning.  With this he reports a poor appetite and decreased intake.  He denies cp, sob, n/v, or signif abdom pain.  He tells me he is open to the idea of a residential physical rehab stay.    Assessment & Plan:  Sepsis due to acute appendiceal abscess - s/p drainage and drain placement 7/11  - now on full soft diet  - JP drain removed 7/19 - antibiotics stopped 7/20  - remains afebrile w/ improving dietary intake   Diarrhea / loose stool No s/sx to suggest an acute infectious etiology - suspect this is simply part of his recovery - encouraged forced liquids and meals today - follow th/o day   Acute renal failure on CKD stage 2 - most c/w ATN - Renal U/S negative  - crt improved w/ volume expansion - stopped IVF 7/22 - assure remains stable on oral intake only    Recent Labs  Lab 12/04/17 0946 12/05/17 0331 12/06/17 0520 12/07/17 0428 12/08/17 0536  CREATININE 1.74* 1.76* 2.46* 2.23* 2.17*    Hypokalemia Corrected - Mg is in normal range   Hypernatremia Corrected w/  free water expansion   CAD s/p PCI by  Dr. [name] 04/06/2017 Maintained on DAPT - Dr. [name] evaluated patient - resumed plavix 7/22   Acute blood loss anemia on anemia of chronic disease Improved s/p 1u PRBCs 7/14 - Hgb appears to be holding steady at ~8 - no gross blood loss   Recent Labs  Lab 12/04/17 0946 12/05/17 0331 12/06/17 0520 12/07/17 0428 12/08/17 0536  HGB 9.0* 8.6* 7.9* 8.0* 8.0*     HTN BP remains elevated - med tx adjusted again today   DM2 A1c 7.5 - CBG currently controlled   DVT prophylaxis: SCDs Code Status: FULL CODE Family Communication: no family present at time of exam  Disposition Plan: for SNF rehab stay, potentially ready for d/c 7/24  Consultants:  Gen Surgery  Cardiology Nephrology   Antimicrobials:  None since 7/19  Objective: Blood pressure (!) 169/87, pulse 65, temperature 98.3 F (36.8 C), temperature source Oral, resp. rate 16, height 6' 2.02" (1.88 m), weight 99.8 kg (220 lb), SpO2 99 %.  Intake/Output Summary (Last 24 hours) at 12/08/2017 1020 Last data filed at 12/08/2017 0900 Gross per 24 hour  Intake 1134.62 ml  Output 550 ml  Net 584.62 ml   Filed Weights   11/24/17 1004 11/26/17 1507  Weight: 99.8 kg (220 lb) 99.8 kg (220 lb)    Examination: General: NAD Lungs: CTA B w/o wheezing or crackles  Cardiovascular: RRR Abdomen: NT/ND, soft, bowel sounds positive, no rebound, no mass  Extremities: No signif  C/C/E B LE   CBC: Recent Labs  Lab 12/06/17 0520 12/07/17 0428 12/08/17 0536  WBC 9.5 10.1 9.9  HGB 7.9* 8.0* 8.0*  HCT 26.1* 25.6* 26.4*  MCV 107.4* 105.8* 106.0*  PLT 340 305 103   Basic Metabolic Panel: Recent Labs  Lab 12/06/17 0520 12/07/17 0428 12/08/17 0536  NA 146* 141 139  K 3.2* 3.8 3.7  CL 113* 109 108  CO2 27 27 25   GLUCOSE 49* 170* 93  BUN 18 21 25*  CREATININE 2.46* 2.23* 2.17*  CALCIUM 8.1* 7.9* 8.1*  MG  --  1.7  --    GFR: Estimated Creatinine Clearance: 40 mL/min (A) (by C-G formula based on SCr of 2.17 mg/dL  (H)).  Liver Function Tests: No results for input(s): AST, ALT, ALKPHOS, BILITOT, PROT, ALBUMIN in the last 168 hours. No results for input(s): LIPASE, AMYLASE in the last 168 hours. No results for input(s): AMMONIA in the last 168 hours.  HbA1C: Hgb A1c MFr Bld  Date/Time Value Ref Range Status  11/24/2017 10:15 AM 7.5 (H) 4.8 - 5.6 % Final    Comment:    (NOTE)         Prediabetes: 5.7 - 6.4         Diabetes: >6.4         Glycemic control for adults with diabetes: <7.0   04/01/2017 06:50 PM 7.2 (H) 4.8 - 5.6 % Final    Comment:    (NOTE) Pre diabetes:          5.7%-6.4% Diabetes:              >6.4% Glycemic control for   <7.0% adults with diabetes     CBG: Recent Labs  Lab 12/07/17 0647 12/07/17 1128 12/07/17 1623 12/07/17 2108 12/08/17 0615  GLUCAP 140* 199* 143* 136* 84    Recent Results (from the past 240 hour(s))  Culture, Urine     Status: None   Collection Time: 11/28/17 10:30 AM  Result Value Ref Range Status   Specimen Description URINE, CLEAN CATCH  Final   Special Requests NONE  Final   Culture   Final    NO GROWTH Performed at [facility], [place] [address] [place], [place] [address]    Report Status 11/29/2017 FINAL  Final     Scheduled Meds: . amLODipine  10 mg Oral Daily  . aspirin  81 mg Oral Daily  . atorvastatin  40 mg Oral QHS  . clopidogrel  75 mg Oral Daily  . dorzolamide-timolol  1 drop Both Eyes BID  . feeding supplement  1 Container Oral TID BM  . fluticasone  1 spray Each Nare Daily  . insulin aspart  0-9 Units Subcutaneous TID WC  . insulin detemir  20 Units Subcutaneous Daily  . isosorbide-hydrALAZINE  1 tablet Oral TID  . metoprolol succinate  100 mg Oral Daily  . saccharomyces boulardii  250 mg Oral BID     LOS: 14 days   Cherene Altes, MD [facility] Office  [phone] Pager - Text Page per Amion  If 7PM-7AM, please contact night-coverage per Amion 12/08/2017, 10:20 AM

## 2017-12-09 LAB — COMPREHENSIVE METABOLIC PANEL
ALT: 28 U/L (ref 0–44)
AST: 38 U/L (ref 15–41)
Albumin: 2.3 g/dL — ABNORMAL LOW (ref 3.5–5.0)
Alkaline Phosphatase: 112 U/L (ref 38–126)
Anion gap: 6 (ref 5–15)
BUN: 24 mg/dL — ABNORMAL HIGH (ref 8–23)
CHLORIDE: 109 mmol/L (ref 98–111)
CO2: 24 mmol/L (ref 22–32)
Calcium: 8 mg/dL — ABNORMAL LOW (ref 8.9–10.3)
Creatinine, Ser: 1.98 mg/dL — ABNORMAL HIGH (ref 0.61–1.24)
GFR, EST AFRICAN AMERICAN: 38 mL/min — AB (ref >60)
GFR, EST NON AFRICAN AMERICAN: 32 mL/min — AB (ref >60)
Glucose, Bld: 122 mg/dL — ABNORMAL HIGH (ref 70–99)
Potassium: 3.8 mmol/L (ref 3.5–5.1)
Sodium: 139 mmol/L (ref 135–145)
Total Bilirubin: 0.5 mg/dL (ref 0.3–1.2)
Total Protein: 6.3 g/dL — ABNORMAL LOW (ref 6.5–8.1)

## 2017-12-09 LAB — CBC
HEMATOCRIT: 24.9 % — AB (ref 39.0–52.0)
Hemoglobin: 7.7 g/dL — ABNORMAL LOW (ref 13.0–17.0)
MCH: 33.2 pg (ref 26.0–34.0)
MCHC: 30.9 g/dL (ref 30.0–36.0)
MCV: 107.3 fL — AB (ref 78.0–100.0)
Platelets: 279 10*3/uL (ref 150–400)
RBC: 2.32 MIL/uL — AB (ref 4.22–5.81)
RDW: 13 % (ref 11.5–15.5)
WBC: 9.6 10*3/uL (ref 4.0–10.5)

## 2017-12-09 LAB — GLUCOSE, CAPILLARY
Glucose-Capillary: 91 mg/dL (ref 70–99)
Glucose-Capillary: 120 mg/dL — ABNORMAL HIGH (ref 70–99)

## 2017-12-09 LAB — MAGNESIUM: Magnesium: 1.7 mg/dL (ref 1.7–2.4)

## 2017-12-09 MED ORDER — ALPRAZOLAM 1 MG PO TABS: 1.0000 mg | ORAL_TABLET | Freq: Two times a day (BID) | ORAL | 0 refills | Status: DC | PRN

## 2017-12-09 MED ORDER — INSULIN DETEMIR 100 UNIT/ML FLEXPEN: 20.0000 [IU] | PEN_INJECTOR | Freq: Every day | SUBCUTANEOUS | Status: DC

## 2017-12-09 MED ORDER — AMLODIPINE BESYLATE 10 MG PO TABS: 10.0000 mg | ORAL_TABLET | Freq: Every day | ORAL | Status: DC

## 2017-12-09 MED ORDER — TRAMADOL HCL 50 MG PO TABS: 50.0000 mg | ORAL_TABLET | Freq: Four times a day (QID) | ORAL | 0 refills | Status: DC | PRN

## 2017-12-09 MED ORDER — SACCHAROMYCES BOULARDII 250 MG PO CAPS: 250.0000 mg | ORAL_CAPSULE | Freq: Two times a day (BID) | ORAL | Status: DC

## 2017-12-09 MED ORDER — ACETAMINOPHEN 325 MG PO TABS: 650.0000 mg | ORAL_TABLET | Freq: Four times a day (QID) | ORAL | Status: DC | PRN

## 2017-12-09 NOTE — Clinical Social Work Placement (Signed)
   CLINICAL SOCIAL WORK PLACEMENT  NOTE  Date:  12/09/2017  Patient Details  Name: David [name] MRN: [id number] Date of Birth: 1945/06/16  Clinical Social Work is seeking post-discharge placement for this patient at the [facility]) level of care (*CSW will initial, date and re-position this form in  chart as items are completed):  Yes   Patient/family provided with [place] Work Department's list of facilities offering this level of care within the geographic area requested by the patient (or if unable, by the patient's family).  Yes   Patient/family informed of their freedom to choose among providers that offer the needed level of care, that participate in Medicare, Medicaid or managed care program needed by the patient, have an available bed and are willing to accept the patient.  Yes   Patient/family informed of [facility]'s ownership interest in [facility] and [facility], as well as of the fact that they are under no obligation to receive care at these facilities.  PASRR submitted to EDS on       PASRR number received on 11/28/17     Existing PASRR number confirmed on       FL2 transmitted to all facilities in geographic area requested by pt/family on 11/28/17     FL2 transmitted to all facilities within larger geographic area on       Patient informed that his/her managed care company has contracts with or will negotiate with certain facilities, including the following:        Yes   Patient/family informed of bed offers received.  Patient chooses bed at [facility] recommends and patient chooses bed at      Patient to be transferred to [facility] on 12/09/17.  Patient to be transferred to facility by [facility]     Patient family notified on 12/09/17 of transfer.  Name of family member notified:  Gates Rigg     PHYSICIAN       Additional Comment:     _______________________________________________ Vinie Sill, [place] 12/09/2017, 11:19 AM

## 2017-12-09 NOTE — Progress Notes (Signed)
Pt discharged to [facility] in stable condition, report called to [facility].  Pt and belongings transported via cart and transporters x 2.  All discharge paperwork and Rx's taken by transport.  [id number]

## 2017-12-09 NOTE — Discharge Summary (Signed)
Physician Discharge Summary  David [name] ATF:[id number] DOB: 1945-08-04 DOA: 11/24/2017  PCP: [name], MD  Admit date: 11/24/2017 Discharge date: 12/09/2017  Admitted From: Home Disposition: SNF   Recommendations for Outpatient Follow-up:  1. Follow up with PCP in 1-2 weeks 2. Follow up BMP, CBC in next 1 week 3. Follow up with general surgery 8/14  Home Health: N/A Equipment/Devices: Per SNF Discharge Condition: Stable CODE STATUS: Full Diet recommendation: Heart healthy, carb-modified  Brief/Interim Summary: David [name] is a 72 y.o. male with a history of CAD s/p stenting Nov 2018 on DAPT, previous CHF with most recent echo normal, T2DM, stage II CKD, HTN who presented with periumbilical abdominal pain found to have appendicitis related to appendicolith on imaging. Also with AKI and hyperglycemia. Surgery consulted and cardiology saw patient to inform decisions regarding antiplatelet therapy. Plavix has been held. Sepsis worsened despite antibiotics and surgery performed drainage of appendiceal abscess with drain placement 7/11. There was some perioperative hypotension and evidence of acute renal failure with ATN for which nephrology was consulted. Fortunately this improved with rehydration and observation. JP drain was removed 7/19 and zosyn stopped 7/20 with continued improvement. PT has recommended SNF.  Discharge Diagnoses:  Principal Problem:   Appendicitis Active Problems:   Hypertension   Diabetes mellitus ([place])   Status post coronary artery stent placement   Acute renal failure superimposed on stage 3 chronic kidney disease (HCC)  Sepsis due to acute appendiceal abscess: s/p drainage and drain placement 7/11 by Dr. Lucia Gaskins. JP removed 7/19, zosyn continued through 7/20. Remains afebrile, taking good po.  - Follow up with surgery 8/14 - Continue pain control w/tramadol  Loose stools: No evidence of acute infectious etiology, resolved over past 24 hours.  -  Monitor and treat symptomatically.   - Probiotics continued   Acute renal failure on stage III CKD: Microscopy consistent with florid ATN. Renal U/S negative. Improved with IVF's (off for 48 hours). Nephrology signed off 7/17. - Avoid hypotension, nephrotoxins. - Renally dose medications - Monitor BMP in next week  Hypokalemia: Improved  Hypernatremia: Mild. Resolved with improved po intake  CAD s/p PCI by Dr. [name] 04/06/2017: Maintained on DAPT. Appears euvolemic.  - Cardiology, Dr. [name], evaluated patient. Continue ASA, held plavix perioperatively, restarted 7/22. Continue statin.  Acute blood loss anemia on anemia of chronic disease:  - Improved s/p 1u PRBCs 7/14. Has been stable since, hgb ~8. Will benefit from stopping daily lab draws.  - Monitor CBC in next week.    HTN:  - Continue metoprolol, bidil, norvasc (increased to 10mg )  IDT2DM: HbA1c 7.5%. - Continue levemir (dose decreased to 20mg  daily) and mealtime insulin.   Spinal stenosis:  - Continue neurontin, robaxin, pain medications prn and PT. Recommend surgery follow up as was planned PTA.  Left hip pain: Reported fall in bathroom a few days PTA with resultant left lateral thigh pain. Resolved.   Discharge Instructions Discharge Instructions    Diet - low sodium heart healthy   Complete by:  As directed    Diet Carb Modified   Complete by:  As directed    Increase activity slowly   Complete by:  As directed      Allergies as of 12/09/2017   No Known Allergies     Medication List    STOP taking these medications   HYDROcodone-acetaminophen 10-325 MG tablet Commonly known as:  NORCO   polyethylene glycol packet Commonly known as:  MIRALAX / GLYCOLAX  TAKE these medications   acetaminophen 325 MG tablet Commonly known as:  TYLENOL Take 2 tablets (650 mg total) by mouth every 6 (six) hours as needed for mild pain or moderate pain (or Fever >/= 101).   albuterol 108 (90 Base) MCG/ACT  inhaler Commonly known as:  PROVENTIL HFA;VENTOLIN HFA Inhale 2 puffs into the lungs every 6 (six) hours as needed for wheezing or shortness of breath.   ALPRAZolam 1 MG tablet Commonly known as:  XANAX Take 1 tablet (1 mg total) by mouth 2 (two) times daily as needed for anxiety. What changed:  See the new instructions.   amLODipine 10 MG tablet Commonly known as:  NORVASC Take 1 tablet (10 mg total) by mouth daily. What changed:    medication strength  how much to take   aspirin EC 81 MG tablet Take 81 mg by mouth daily.   atorvastatin 40 MG tablet Commonly known as:  LIPITOR Take 40 mg by mouth at bedtime.   clopidogrel 75 MG tablet Commonly known as:  PLAVIX Take 1 tablet (75 mg total) by mouth daily.   dorzolamide-timolol 22.3-6.8 MG/ML ophthalmic solution Commonly known as:  COSOPT Place 1 drop into both eyes 2 (two) times daily.   fluticasone 50 MCG/ACT nasal spray Commonly known as:  FLONASE Place 1 spray into both nostrils daily.   gabapentin 600 MG tablet Commonly known as:  NEURONTIN Take 600 mg by mouth 2 (two) times daily.   HUMALOG KWIKPEN 100 UNIT/ML KiwkPen Generic drug:  insulin lispro Inject 4 Units into the skin 3 (three) times daily.   Insulin Detemir 100 UNIT/ML Pen Commonly known as:  LEVEMIR FLEXTOUCH Inject 20 Units into the skin daily. What changed:  how much to take   isosorbide-hydrALAZINE 20-37.5 MG tablet Commonly known as:  BIDIL Take 1 tablet by mouth 3 (three) times daily.   methocarbamol 750 MG tablet Commonly known as:  ROBAXIN Take 1 tablet (750 mg total) by mouth every 6 (six) hours as needed for muscle spasms.   metoprolol succinate 100 MG 24 hr tablet Commonly known as:  TOPROL-XL Take 1 tablet (100 mg total) by mouth daily. Take with or immediately following a meal.   saccharomyces boulardii 250 MG capsule Commonly known as:  FLORASTOR Take 1 capsule (250 mg total) by mouth 2 (two) times daily.   traMADol 50 MG  tablet Commonly known as:  ULTRAM Take 1 tablet (50 mg total) by mouth every 6 (six) hours as needed for severe pain.      Follow-up Information    Alphonsa Overall, MD. Go on 12/30/2017.   Specialty:  General Surgery Why:  Follow up appointment scheduled for 12:00 PM. Please arrive by 11:30 AM for check in. Bring photo ID and insurance information.  Contact information: [place] [address] [phone]        [name], MD. Schedule an appointment as soon as possible for a visit in 2 week(s).   Specialty:  Internal Medicine Contact information: [address] [place] [address] [phone]          No Known Allergies  Consultations:  General surgery  Nephrology  Procedures/Studies: Ct Abdomen Pelvis Wo Contrast  Result Date: 12/04/2017 CLINICAL DATA:  Perforated appendicitis.  Persistent pain. EXAM: CT ABDOMEN AND PELVIS WITHOUT CONTRAST TECHNIQUE: Multidetector CT imaging of the abdomen and pelvis was performed following the standard protocol without IV contrast. COMPARISON:  11/24/2017 FINDINGS: Lower chest: Atherosclerotic calcifications  are noted involving the LAD, left circumflex and RCA coronary arteries. Hepatobiliary: There is no focal liver abnormality. The gallbladder appears normal. No biliary dilatation. Pancreas: Unremarkable. No pancreatic ductal dilatation or surrounding inflammatory changes. Spleen: Normal in size without focal abnormality. Adrenals/Urinary Tract: The adrenal glands are normal. No kidney mass or hydronephrosis identified. Urinary bladder is unremarkable. Stomach/Bowel: The stomach appears nondistended. The proximal and mid small bowel loops are unremarkable. Again noted are extensive inflammatory changes involving the right lower quadrant small bowel loops. Changes include large and small bowel wall edema with diffuse surrounding fat stranding. Evaluation of this area is significantly diminished  due to lack of both IV and oral contrast material. A right lower quadrant percutaneous drainage catheter is identified which terminates in the central pelvis. There is a calcification within the central pelvis measuring 7 mm which in the setting of recent ruptured appendicitis may represent an appendicolith. No large fluid collections identified. Vascular/Lymphatic: No significant vascular findings are present. No enlarged abdominal or pelvic lymph nodes. Reproductive: Prostate is unremarkable. Other: Diffuse fat stranding and mesenteric edema is identified. Small amount of free fluid noted within the dependent portion of the pelvis. Musculoskeletal: Degenerative disc disease noted within the lumbar spine. Previous right hip arthroplasty. IMPRESSION: 1. Evaluation of right lower quadrant inflammatory changes is significantly diminished due to lack of IV and oral contrast material. Consider further evaluation with contrast enhanced CT of the abdomen and pelvis following oral contrast administration to assess patency of bowel, rule out abscess, and evaluate for fistula. 2. Again noted are extensive inflammatory changes within the right lower quadrant of the abdomen. As mentioned on previous studies findings may represent the sequelae of ruptured appendicitis. Differential considerations include inflammatory/infectious enteritis. 3. Calcification in the right side of pelvis is noted which in the setting of ruptured appendicitis may represent and appendicolith Electronically Signed   By: Kerby Moors M.D.   On: 12/04/2017 11:48   Ct Abdomen Pelvis Wo Contrast  Result Date: 11/24/2017 CLINICAL DATA:  Abdominal and periumbilical pain. No bowel movement for 1 week. EXAM: CT ABDOMEN AND PELVIS WITHOUT CONTRAST TECHNIQUE: Multidetector CT imaging of the abdomen and pelvis was performed following the standard protocol without IV contrast. COMPARISON:  February 13, 2015 FINDINGS: Lower chest: No acute abnormality.  Hepatobiliary: No focal liver abnormality is seen. No gallstones, gallbladder wall thickening, or biliary dilatation. Pancreas: Unremarkable. No pancreatic ductal dilatation or surrounding inflammatory changes. Spleen: Normal in size without focal abnormality. Adrenals/Urinary Tract: Adrenal glands are unremarkable. Kidneys are normal, without renal calculi, focal lesion, or hydronephrosis. Bladder is unremarkable. Stomach/Bowel: The stomach and small bowel are normal. The appendix is markedly abnormal with a distal appendicolith and distal dilatation measuring up to 2 cm. No extraluminal gas is noted. It would be difficult to confidently exclude a small extraluminal fluid collection but none is definitively seen. There is wall thickening and periappendiceal stranding. There is wall thickening in the cecum near the takeoff of the appendix which may be secondary to the markedly inflamed appendix. The remainder of the colon is unremarkable. Vascular/Lymphatic: Minimal atherosclerosis is seen in the nonaneurysmal aorta. No adenopathy. Reproductive: Prostate is unremarkable. Other: No abdominal wall hernia or abnormality. No abdominopelvic ascites. Musculoskeletal: Status post right hip replacement. Degenerative changes in the visualized spine. IMPRESSION: 1. Appendicitis with marked dilatation, wall thickening, and periappendiceal stranding. There is an appendicolith. Evaluation for a small extraluminal fluid collection would be limited without contrast but none are seen. Appendix: Location: Extends from the right  lower quadrant of the abdomen into the right side of the pelvis. Abuts the sigmoid colon. Diameter: 2 cm Appendicolith: There is a single appendicoliths. Mucosal hyper-enhancement: Not assessed without contrast. Extraluminal gas: None identified. Periappendiceal collection: Difficult to evaluate without contrast but none seen. 2. Minimal atherosclerosis in the nonaneurysmal aorta. Electronically Signed   By:  Dorise Bullion III M.D   On: 11/24/2017 13:18   US Renal  Result Date: 11/27/2017 CLINICAL DATA:  Acute kidney injury. EXAM: RENAL / URINARY TRACT ULTRASOUND COMPLETE COMPARISON:  CT abdomen and pelvis 11/24/2017. FINDINGS: Right Kidney: Length: 12.3 cm. Echogenicity within normal limits. No mass or hydronephrosis visualized. Left Kidney: Length: 12.3 cm. Echogenicity within normal limits. No mass or hydronephrosis visualized. Bladder: Appears normal for degree of bladder distention. IMPRESSION: Negative for hydronephrosis.  Negative exam. Electronically Signed   By: Inge Rise M.D.   On: 11/27/2017 09:57   Dg Chest Port 1 View  Result Date: 11/24/2017 CLINICAL DATA:  Preop cardiovascular exam. EXAM: PORTABLE CHEST 1 VIEW COMPARISON:  03/30/2017 FINDINGS: Cardiomegaly with aortic atherosclerosis. Coronary arterial stent projects over the left heart. Lungs are clear without pulmonary consolidation, overt pulmonary edema, effusion or pneumothorax. Lower cervical degenerative disc and facet arthropathy. No acute osseous abnormality. IMPRESSION: Cardiomegaly with aortic atherosclerosis. No active pulmonary disease. Electronically Signed   By: Ashley Royalty M.D.   On: 11/24/2017 18:20   11/26/17 LAPAROSCOPIC ABDOMINAL EXPLORATION, DRAINAGE OF APPENDICEAL ABCESS. PLACEMENT OF Merri Brunette, MD   Subjective: No further loose stools in past 24 houirs. Eating much better, getting up daily. Wants to go.  Discharge Exam: Vitals:   12/08/17 1923 12/09/17 0407  BP: (!) 145/73 (!) 152/76  Pulse: 71 65  Resp: 18 18  Temp: 98.1 F (36.7 C) 98.1 F (36.7 C)  SpO2: 95% 99%   General: Pt is alert, awake, not in acute distress Cardiovascular: RRR, S1/S2 +, no rubs, no gallops Respiratory: CTA bilaterally, no wheezing, no rhonchi Abdominal: Soft, not significantly tender. Incision sites c/d/i. Nondistended, bowel sounds + Extremities: No edema, no cyanosis  Labs: BNP (last 3 results) Recent  Labs    03/29/17 0102  BNP 614.4*   Basic Metabolic Panel: Recent Labs  Lab 12/05/17 0331 12/06/17 0520 12/07/17 0428 12/08/17 0536 12/09/17 0338  NA 148* 146* 141 139 139  K 2.9* 3.2* 3.8 3.7 3.8  CL 112* 113* 109 108 109  CO2 27 27 27 25 24   GLUCOSE 85 49* 170* 93 122*  BUN 12 18 21  25* 24*  CREATININE 1.76* 2.46* 2.23* 2.17* 1.98*  CALCIUM 8.3* 8.1* 7.9* 8.1* 8.0*  MG  --   --  1.7  --  1.7   Liver Function Tests: Recent Labs  Lab 12/09/17 0338  AST 38  ALT 28  ALKPHOS 112  BILITOT 0.5  PROT 6.3*  ALBUMIN 2.3*   No results for input(s): LIPASE, AMYLASE in the last 168 hours. No results for input(s): AMMONIA in the last 168 hours. CBC: Recent Labs  Lab 12/05/17 0331 12/06/17 0520 12/07/17 0428 12/08/17 0536 12/09/17 0338  WBC 11.8* 9.5 10.1 9.9 9.6  HGB 8.6* 7.9* 8.0* 8.0* 7.7*  HCT 27.6* 26.1* 25.6* 26.4* 24.9*  MCV 104.2* 107.4* 105.8* 106.0* 107.3*  PLT 374 340 305 306 279   Cardiac Enzymes: No results for input(s): CKTOTAL, CKMB, CKMBINDEX, TROPONINI in the last 168 hours. BNP: Invalid input(s): POCBNP CBG: Recent Labs  Lab 12/08/17 0615 12/08/17 1147 12/08/17 1611 12/08/17 2109 12/09/17 3154  GLUCAP  84 117* 129* 155* 91   D-Dimer No results for input(s): DDIMER in the last 72 hours. Hgb A1c No results for input(s): HGBA1C in the last 72 hours. Lipid Profile No results for input(s): CHOL, HDL, LDLCALC, TRIG, CHOLHDL, LDLDIRECT in the last 72 hours. Thyroid function studies No results for input(s): TSH, T4TOTAL, T3FREE, THYROIDAB in the last 72 hours.  Invalid input(s): FREET3 Anemia work up Recent Labs    12/07/17 0428  VITAMINB12 610  FOLATE 8.8  FERRITIN 180  TIBC 171*  IRON 46  RETICCTPCT 3.1   Urinalysis    Component Value Date/Time   COLORURINE AMBER (A) 11/27/2017 1313   APPEARANCEUR CLOUDY (A) 11/27/2017 1313   LABSPEC 1.021 11/27/2017 1313   PHURINE 5.0 11/27/2017 1313   GLUCOSEU NEGATIVE 11/27/2017 1313    HGBUR MODERATE (A) 11/27/2017 1313   BILIRUBINUR NEGATIVE 11/27/2017 1313   KETONESUR NEGATIVE 11/27/2017 1313   PROTEINUR 100 (A) 11/27/2017 1313   UROBILINOGEN 1.0 02/13/2015 1934   NITRITE NEGATIVE 11/27/2017 1313   LEUKOCYTESUR NEGATIVE 11/27/2017 1313   Time coordinating discharge: Approximately 40 minutes  Patrecia Pour, MD  [facility] 12/09/2017, 8:58 AM Pager [phone]

## 2017-12-09 NOTE — Progress Notes (Addendum)
Pt will DC to: [facility]  DC date:  12/09/2017 Family notified: Ferd Hibbs- Spouse  Transport OF:[facility] Please make sure signed prescriptions goes with the patient.   RN, patient, and facility notified of DC. Discharge Summary sent to facility. RN given number for report, [facility] at  [phone] room 107. DC packet on chart. Ambulance transport requested for patient.   CSW signing off.  Thurmond Butts, [place] Social Worker [phone]

## 2017-12-10 ENCOUNTER — Non-Acute Institutional Stay (SKILLED_NURSING_FACILITY): Payer: Medicare Other | Admitting: Internal Medicine

## 2017-12-10 ENCOUNTER — Encounter: Payer: Self-pay | Admitting: Internal Medicine

## 2017-12-10 DIAGNOSIS — A419 Sepsis, unspecified organism: Secondary | ICD-10-CM

## 2017-12-10 DIAGNOSIS — N183 Chronic kidney disease, stage 3 (moderate): Secondary | ICD-10-CM

## 2017-12-10 DIAGNOSIS — E119 Type 2 diabetes mellitus without complications: Secondary | ICD-10-CM

## 2017-12-10 DIAGNOSIS — N179 Acute kidney failure, unspecified: Secondary | ICD-10-CM | POA: Diagnosis not present

## 2017-12-10 DIAGNOSIS — Z794 Long term (current) use of insulin: Secondary | ICD-10-CM

## 2017-12-10 NOTE — Assessment & Plan Note (Addendum)
Continue basal & low dose ac insulin coverage as A1c is @ goal of <8%

## 2017-12-10 NOTE — Assessment & Plan Note (Signed)
Monitor renal function, avoid nephrotoxic drugs

## 2017-12-10 NOTE — Patient Instructions (Signed)
See assessment and plan under each diagnosis in the problem list and acutely for this visit 

## 2017-12-10 NOTE — Progress Notes (Signed)
NURSING HOME LOCATION:  [facility] ROOM NUMBER:  [id number]  CODE STATUS:  Full Code  PCP:  [name], MD  [address]  [place] [address]  This is a comprehensive admission note to [facility] performed on this date less than 30 days from date of admission. Included are preadmission medical/surgical history; reconciled medication list; family history; social history and comprehensive review of systems.  Corrections and additions to the records were documented. Comprehensive physical exam was also performed. Additionally a clinical summary was entered for each active diagnosis pertinent to this admission in the Problem List to enhance continuity of care.  HPI: Patient was hospitalized 7/9-7/24/19 presenting with periumbilical abdominal pain with appendicitis in the context of appendicolith on imaging. Complications included AKI and hyperglycemia. Surgery was consulted, Plavix was held pending surgery. Sepsis worsened despite antibiotics and surgical drainage of appendiceal abscess with drain placement 7/11.  Perioperatively he had hypotension and renal failure with ATN proposed on stage III CKD. Renal ultrasound was negative. Nephrology consulted, renal function improved with rehydration. JP drain was removed 7/19 Zosyn was discontinued 7/20.  Plavix was restarted postoperatively 7/22. Acute blood loss anemia superimposed upon chronic disease anemia was present. He received 1 unit of packed cells 7/14. Hemoglobin remained stable at approximately 8. Basal and mealtime insulin were was continued for his diabetes. Adequate control subtly present with an A1c of 7.5%. He was discharged to SNF for  PT/OT. Labs at discharge revealed creatinine 1.98, albumin 2.3, total protein 6.3, GFR 38. Hemoglobin was 7.7 and hematocrit 24.9 with significant macrocytosis of 107.3. Despite this B12 and folate values were normal.  Past medical and surgical history: Includes  coronary artery disease, glaucoma, reflux, diabetic retinopathy/neuropathy and degenerative joint disease. Patient had PCI 04/06/17. The right great toe was amputated in 2018 in the context of sepsis.  Social history: Nondrinker, never smoked. 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  Family history: Reviewed   Review of systems: He describes intermittent chills of upper extremities over last 6 months. He continues to have some loose stool. He describes some ill-defined aftertaste which he has difficulty describing.  For panic attack last night he took Xanax with response. He employs nasal strips to aid breathing at night. He states his wife has describes snoring but not definite apnea. At home glucoses were 110-127 fasting. He has diabetic neuropathy of the hands and wears special gloves.  Constitutional: No fever, significant weight change, fatigue  Eyes: No redness, discharge, pain, vision change ENT/mouth: No nasal congestion, purulent discharge, earache, change in hearing, sore throat  Cardiovascular: No chest pain, palpitations, paroxysmal nocturnal dyspnea, claudication, edema  Respiratory: No cough, sputum production, hemoptysis, DOE Gastrointestinal: No heartburn, dysphagia, abdominal pain, nausea /vomiting, rectal bleeding, melena Genitourinary: No dysuria, hematuria, pyuria, incontinence, nocturia Musculoskeletal: No joint stiffness, joint swelling, weakness, pain Dermatologic: No rash, pruritus, change in appearance of skin Neurologic: No dizziness, headache, syncope, seizures Psychiatric: No significant depression Endocrine: No change in hair/skin/nails, excessive thirst, excessive hunger, excessive urination  Hematologic/lymphatic: No significant bruising, lymphadenopathy, abnormal bleeding Allergy/immunology: No itchy/watery eyes, significant sneezing, urticaria, angioedema  Physical exam:  Pertinent or positive findings: He is  wearing a strip over the bridge of the nose. Pattern alopecia is present. Grade 1/2 systolic murmur is present at the right base. The second heart sound is increased. Surgical dressings are noted over the abdomen. Pedal pulses are surprisingly good in the context of  his history of diabetes. 1/2 + edema is present at the sock line. He has irregular scarring of the shins.  Valgus deformity is suggested of the knees. Absent R great toe.  General appearance: Adequately nourished; no acute distress, increased work of breathing is present.   Lymphatic: No lymphadenopathy about the head, neck, axilla. Eyes: No conjunctival inflammation or lid edema is present. There is no scleral icterus. Ears:  External ear exam shows no significant lesions or deformities.   Nose:  External nasal examination shows no deformity or inflammation. Nasal mucosa are pink and moist without lesions, exudates Oral exam: Lips and gums are healthy appearing.There is no oropharyngeal erythema or exudate. Neck:  No thyromegaly, masses, tenderness noted.    Heart:  Normal rate and regular rhythm. S1 normal without gallop, click, rub.  Lungs: Chest clear to auscultation without wheezes, rhonchi, rales, rubs. Abdomen: Bowel sounds are normal.  Abdomen is soft and nontender with no organomegaly, hernias, masses. GU: Deferred  Extremities:  No cyanosis, clubbing. Neurologic exam:  Strength equal  in upper & lower extremities. Skin: Warm & dry w/o tenting. No significant rash.  See clinical summary under each active problem in the Problem List with associated updated therapeutic plan

## 2017-12-10 NOTE — Assessment & Plan Note (Signed)
Surgery follow-up 12/30/17 with Dr. Lucia Gaskins

## 2017-12-15 ENCOUNTER — Encounter: Payer: Self-pay | Admitting: Internal Medicine

## 2017-12-15 ENCOUNTER — Non-Acute Institutional Stay (SKILLED_NURSING_FACILITY): Payer: Medicare Other | Admitting: Internal Medicine

## 2017-12-15 DIAGNOSIS — G894 Chronic pain syndrome: Secondary | ICD-10-CM | POA: Diagnosis not present

## 2017-12-15 DIAGNOSIS — Z9189 Other specified personal risk factors, not elsewhere classified: Secondary | ICD-10-CM

## 2017-12-15 DIAGNOSIS — I509 Heart failure, unspecified: Secondary | ICD-10-CM | POA: Diagnosis not present

## 2017-12-15 NOTE — Patient Instructions (Signed)
See assessment and plan under each diagnosis in the problem list and acutely for this visit Total time 38  minutes; greater than 50% of the visit spent counseling patient and coordinating care for problems addressed at this encounter

## 2017-12-15 NOTE — Assessment & Plan Note (Signed)
Hydrocodone-acetaminophen will be increased to 10-3 25 mg every 6 hours as needed while at SNF Risk discussed with the patient to document informed consent

## 2017-12-15 NOTE — Assessment & Plan Note (Signed)
Hydrocodone-acetaminophen will be increased to 10-3 25 mg every 6 hours as needed here at the SNF Risks related to respiration depression and constipation discussed with the patient  Upon discharge the SNF, all pain medicines will come from Dr. Nicholaus Bloom

## 2017-12-15 NOTE — Assessment & Plan Note (Addendum)
Clinically resolved but he describes some fluid retention amlodipine could be contributing to perception of fluid retention. Furosemide and cardiac drugs will be reconciled based on Dr. Irven Shelling 11/25/17 consult note. Specifically amlodipine will be decreased to 5 mg daily and furosemide 20 mg added daily. No change in the metoprolol succinate 100 mg daily and BiDil 20-37.5 mg 3 times a day. Losartan 100 mg will not be reinitiated unless there persistent hypertension. Blood pressure presently mildly elevated but this may be related to inadequate pain control.

## 2017-12-15 NOTE — Progress Notes (Signed)
NURSING HOME LOCATION:  [facility] ROOM NUMBER:  [id number]  CODE STATUS:  Full Code  PCP:  [name], MD  [address] [place] [address]  This is a nursing facility follow up for specific acute issue of uncontrolled pain.  Interim medical record and care since last [facility] [facility] visit was updated with review of diagnostic studies and change in clinical status since last visit were documented.  HPI: The patient states that his pain is not controlled despite hydrocodone-acetaminophen 5-325 mg 6 hours as needed. [facility] was contacted by FAX. They verified hydrocodone-acetaminophen dosage is 10-3 25 one tablet every 6 hours as needed. 120 pills were administered per the records on 09/23/17 according to the pharmacist at [facility].  The patient states he is not taking the OxyContin-acetaminophen every 6 hours as written He states that he has chronic pain related to 4 back surgeries. In fact he was supposed to have surgery for spinal stenosis in July but this was postponed because of his acute hospitalization for appendicitis.  He's also had hip and knee procedures.  He's also concerned as he does not believe he is on furosemide and has history of heart failure. He realizes his losartan 100 mg was discontinued because of hypotension. He questions whether he is getting the right dose of metoprolol and amlodipine as it has a different appearance and size from his generics. I reviewed Dr. Irven Shelling 11/25/17 consult note. At that time the patient had been on furosemide 20 mg daily, metoprolol succinate 100 mg daily,Bidil 20-3 37.5 mg 3 times a day  and amlodipine 5 mg daily. Active comorbidities for increased risk of respiratory suppression with opioids include history of heart failure, chronic kidney disease, and active Rx for Xanax. He denies any pulmonary history although he has a diagnosis of acute respiratory failure with hypoxia. Also he is on albuterol every  6 hours as needed.  Review of systems: Positive review of systems as outlined above. He feels he is having fluid retention off the furosemide. He denies significant constipation at this time. Constitutional: No fever, significant weight change, fatigue  Cardiovascular: No chest pain, palpitations, paroxysmal nocturnal dyspnea, claudication  Respiratory: No cough, sputum production, hemoptysis, DOE, significant snoring, apnea   Gastrointestinal: No heartburn, dysphagia, abdominal pain, nausea /vomiting, rectal bleeding, melena, change in bowels Genitourinary: No dysuria, hematuria, pyuria, incontinence, nocturia Dermatologic: No rash, pruritus, change in appearance of skin Neurologic: No dizziness, headache, syncope, seizures, new numbness, tingling Psychiatric: No significant anxiety, depression, insomnia, anorexia Endocrine: No change in hair/skin/nails, excessive thirst, excessive hunger, excessive urination  Hematologic/lymphatic: No significant bruising, lymphadenopathy, abnormal bleeding Allergy/immunology: No itchy/watery eyes, significant sneezing, urticaria, angioedema  Physical exam:  Pertinent or positive findings: He is agitated as he feels there has been inappropriate medication reconciliation. Breath sounds are decreased. Very faint systolic murmur at the base. Second heart sound is increased. Bowel sounds are decreased but the abdomen is nontender. No clinical ileus present. There is trace peripheral edema. He is wearing compression glove on the right hand.  General appearance: Adequately nourished; no acute distress, increased work of breathing is present.   Lymphatic: No lymphadenopathy about the head, neck, axilla. Eyes: No conjunctival inflammation or lid edema is present. There is no scleral icterus. Ears:  External ear exam shows no significant lesions or deformities.   Nose:  External nasal examination shows no deformity or inflammation. Nasal mucosa are pink and moist  without lesions, exudates Oral exam:  Lips and gums are healthy appearing.  Neck:  No thyromegaly, masses, tenderness noted.    Heart:  Normal rate and regular rhythm. S1  normal without gallop, click, rub .  Lungs:  without wheezes, rhonchi, rales, rubs. Abdomen:  Abdomen is soft and nontender with no organomegaly, hernias, masses. GU: Deferred  Extremities:  No cyanosis, clubbing  Skin: Warm & dry w/o tenting. No significant lesions or rash.  See summary under each active problem in the Problem List with associated updated therapeutic plan

## 2017-12-19 ENCOUNTER — Other Ambulatory Visit: Payer: Self-pay

## 2017-12-19 ENCOUNTER — Emergency Department (HOSPITAL_COMMUNITY): Payer: Medicare Other

## 2017-12-19 ENCOUNTER — Observation Stay (HOSPITAL_COMMUNITY)
Admission: EM | Admit: 2017-12-19 | Discharge: 2017-12-22 | Disposition: A | Discharge status: Home / Self Care | Payer: Medicare Other | Attending: Internal Medicine | Admitting: Internal Medicine

## 2017-12-19 ENCOUNTER — Encounter (HOSPITAL_COMMUNITY): Payer: Self-pay

## 2017-12-19 DIAGNOSIS — E1142 Type 2 diabetes mellitus with diabetic polyneuropathy: Secondary | ICD-10-CM | POA: Diagnosis not present

## 2017-12-19 DIAGNOSIS — H409 Unspecified glaucoma: Secondary | ICD-10-CM | POA: Diagnosis not present

## 2017-12-19 DIAGNOSIS — I509 Heart failure, unspecified: Secondary | ICD-10-CM

## 2017-12-19 DIAGNOSIS — Z89411 Acquired absence of right great toe: Secondary | ICD-10-CM | POA: Diagnosis not present

## 2017-12-19 DIAGNOSIS — M5030 Other cervical disc degeneration, unspecified cervical region: Secondary | ICD-10-CM | POA: Diagnosis not present

## 2017-12-19 DIAGNOSIS — M1990 Unspecified osteoarthritis, unspecified site: Secondary | ICD-10-CM | POA: Diagnosis not present

## 2017-12-19 DIAGNOSIS — I5033 Acute on chronic diastolic (congestive) heart failure: Secondary | ICD-10-CM | POA: Diagnosis not present

## 2017-12-19 DIAGNOSIS — N183 Chronic kidney disease, stage 3 (moderate): Secondary | ICD-10-CM | POA: Insufficient documentation

## 2017-12-19 DIAGNOSIS — J9601 Acute respiratory failure with hypoxia: Secondary | ICD-10-CM | POA: Diagnosis not present

## 2017-12-19 DIAGNOSIS — M549 Dorsalgia, unspecified: Secondary | ICD-10-CM

## 2017-12-19 DIAGNOSIS — F419 Anxiety disorder, unspecified: Secondary | ICD-10-CM | POA: Diagnosis not present

## 2017-12-19 DIAGNOSIS — I272 Pulmonary hypertension, unspecified: Secondary | ICD-10-CM | POA: Insufficient documentation

## 2017-12-19 DIAGNOSIS — I5032 Chronic diastolic (congestive) heart failure: Secondary | ICD-10-CM

## 2017-12-19 DIAGNOSIS — E119 Type 2 diabetes mellitus without complications: Secondary | ICD-10-CM | POA: Diagnosis not present

## 2017-12-19 DIAGNOSIS — E1122 Type 2 diabetes mellitus with diabetic chronic kidney disease: Secondary | ICD-10-CM | POA: Diagnosis not present

## 2017-12-19 DIAGNOSIS — E11319 Type 2 diabetes mellitus with unspecified diabetic retinopathy without macular edema: Secondary | ICD-10-CM | POA: Insufficient documentation

## 2017-12-19 DIAGNOSIS — Z7902 Long term (current) use of antithrombotics/antiplatelets: Secondary | ICD-10-CM | POA: Diagnosis not present

## 2017-12-19 DIAGNOSIS — K219 Gastro-esophageal reflux disease without esophagitis: Secondary | ICD-10-CM | POA: Insufficient documentation

## 2017-12-19 DIAGNOSIS — J9621 Acute and chronic respiratory failure with hypoxia: Secondary | ICD-10-CM | POA: Insufficient documentation

## 2017-12-19 DIAGNOSIS — I10 Essential (primary) hypertension: Secondary | ICD-10-CM | POA: Diagnosis present

## 2017-12-19 DIAGNOSIS — I5043 Acute on chronic combined systolic (congestive) and diastolic (congestive) heart failure: Secondary | ICD-10-CM | POA: Diagnosis not present

## 2017-12-19 DIAGNOSIS — R0602 Shortness of breath: Secondary | ICD-10-CM | POA: Diagnosis present

## 2017-12-19 DIAGNOSIS — Z955 Presence of coronary angioplasty implant and graft: Secondary | ICD-10-CM | POA: Diagnosis not present

## 2017-12-19 DIAGNOSIS — G894 Chronic pain syndrome: Secondary | ICD-10-CM | POA: Insufficient documentation

## 2017-12-19 DIAGNOSIS — I130 Hypertensive heart and chronic kidney disease with heart failure and stage 1 through stage 4 chronic kidney disease, or unspecified chronic kidney disease: Principal | ICD-10-CM | POA: Insufficient documentation

## 2017-12-19 DIAGNOSIS — D631 Anemia in chronic kidney disease: Secondary | ICD-10-CM | POA: Insufficient documentation

## 2017-12-19 DIAGNOSIS — Z7951 Long term (current) use of inhaled steroids: Secondary | ICD-10-CM | POA: Diagnosis not present

## 2017-12-19 DIAGNOSIS — I2510 Atherosclerotic heart disease of native coronary artery without angina pectoris: Secondary | ICD-10-CM | POA: Insufficient documentation

## 2017-12-19 DIAGNOSIS — E1169 Type 2 diabetes mellitus with other specified complication: Secondary | ICD-10-CM

## 2017-12-19 DIAGNOSIS — Z7982 Long term (current) use of aspirin: Secondary | ICD-10-CM | POA: Insufficient documentation

## 2017-12-19 DIAGNOSIS — Z794 Long term (current) use of insulin: Secondary | ICD-10-CM | POA: Diagnosis not present

## 2017-12-19 DIAGNOSIS — G8929 Other chronic pain: Secondary | ICD-10-CM

## 2017-12-19 LAB — BASIC METABOLIC PANEL
Anion gap: 11 (ref 5–15)
BUN: 28 mg/dL — ABNORMAL HIGH (ref 8–23)
CO2: 23 mmol/L (ref 22–32)
Calcium: 8.6 mg/dL — ABNORMAL LOW (ref 8.9–10.3)
Chloride: 109 mmol/L (ref 98–111)
Creatinine, Ser: 1.82 mg/dL — ABNORMAL HIGH (ref 0.61–1.24)
GFR calc Af Amer: 42 mL/min — ABNORMAL LOW (ref >60)
GFR calc non Af Amer: 36 mL/min — ABNORMAL LOW (ref >60)
Glucose, Bld: 97 mg/dL (ref 70–99)
Potassium: 3.8 mmol/L (ref 3.5–5.1)
Sodium: 143 mmol/L (ref 135–145)

## 2017-12-19 LAB — GLUCOSE, CAPILLARY
GLUCOSE-CAPILLARY: 137 mg/dL — AB (ref 70–99)
GLUCOSE-CAPILLARY: 175 mg/dL — AB (ref 70–99)

## 2017-12-19 LAB — BRAIN NATRIURETIC PEPTIDE: B Natriuretic Peptide: 458.5 pg/mL — ABNORMAL HIGH (ref 0.0–100.0)

## 2017-12-19 LAB — TSH: TSH: 1.906 u[IU]/mL (ref 0.350–4.500)

## 2017-12-19 LAB — CBC
HCT: 27.1 % — ABNORMAL LOW (ref 39.0–52.0)
Hemoglobin: 8.3 g/dL — ABNORMAL LOW (ref 13.0–17.0)
MCH: 33.2 pg (ref 26.0–34.0)
MCHC: 30.6 g/dL (ref 30.0–36.0)
MCV: 108.4 fL — ABNORMAL HIGH (ref 78.0–100.0)
Platelets: 244 10*3/uL (ref 150–400)
RBC: 2.5 MIL/uL — ABNORMAL LOW (ref 4.22–5.81)
RDW: 14.6 % (ref 11.5–15.5)
WBC: 9.7 10*3/uL (ref 4.0–10.5)

## 2017-12-19 LAB — HEMOGLOBIN A1C
HEMOGLOBIN A1C: 6.3 % — AB (ref 4.8–5.6)
Mean Plasma Glucose: 134.11 mg/dL

## 2017-12-19 LAB — I-STAT TROPONIN, ED: Troponin i, poc: 0 ng/mL (ref 0.00–0.08)

## 2017-12-19 MED ORDER — LOSARTAN POTASSIUM 50 MG PO TABS: 100.0000 mg | ORAL_TABLET | Freq: Once | ORAL | Status: DC

## 2017-12-19 MED ORDER — GABAPENTIN 600 MG PO TABS
600.0000 mg | ORAL_TABLET | Freq: Two times a day (BID) | ORAL | Status: DC
  Administered 2017-12-19 – 2017-12-22 (×7): 600 mg via ORAL
  Filled 2017-12-19 (×7): qty 1

## 2017-12-19 MED ORDER — POTASSIUM CHLORIDE CRYS ER 20 MEQ PO TBCR
60.0000 meq | EXTENDED_RELEASE_TABLET | Freq: Once | ORAL | Status: DC
  Filled 2017-12-19: qty 3

## 2017-12-19 MED ORDER — SODIUM CHLORIDE 0.9 % IV SOLN: 250.0000 mL | INTRAVENOUS | Status: DC | PRN

## 2017-12-19 MED ORDER — CLOPIDOGREL BISULFATE 75 MG PO TABS
75.0000 mg | ORAL_TABLET | Freq: Every day | ORAL | Status: DC
  Administered 2017-12-19 – 2017-12-22 (×4): 75 mg via ORAL
  Filled 2017-12-19 (×4): qty 1

## 2017-12-19 MED ORDER — FUROSEMIDE 10 MG/ML IJ SOLN
20.0000 mg | Freq: Two times a day (BID) | INTRAMUSCULAR | Status: DC
  Administered 2017-12-19 – 2017-12-22 (×6): 20 mg via INTRAVENOUS
  Filled 2017-12-19 (×6): qty 2

## 2017-12-19 MED ORDER — METOPROLOL SUCCINATE ER 100 MG PO TB24
100.0000 mg | ORAL_TABLET | Freq: Every day | ORAL | Status: DC
  Administered 2017-12-19 – 2017-12-22 (×4): 100 mg via ORAL
  Filled 2017-12-19 (×4): qty 1

## 2017-12-19 MED ORDER — FLUTICASONE PROPIONATE 50 MCG/ACT NA SUSP
1.0000 | Freq: Every day | NASAL | Status: DC
  Administered 2017-12-20: 1 via NASAL
  Filled 2017-12-19: qty 16

## 2017-12-19 MED ORDER — ALPRAZOLAM 0.25 MG PO TABS
0.2500 mg | ORAL_TABLET | Freq: Every evening | ORAL | Status: DC | PRN
  Administered 2017-12-20 – 2017-12-21 (×2): 0.25 mg via ORAL
  Filled 2017-12-19 (×2): qty 1

## 2017-12-19 MED ORDER — ACETAMINOPHEN 325 MG PO TABS: 650.0000 mg | ORAL_TABLET | Freq: Four times a day (QID) | ORAL | Status: DC | PRN

## 2017-12-19 MED ORDER — INSULIN ASPART 100 UNIT/ML ~~LOC~~ SOLN
4.0000 [IU] | Freq: Three times a day (TID) | SUBCUTANEOUS | Status: DC
  Administered 2017-12-19 – 2017-12-22 (×8): 4 [IU] via SUBCUTANEOUS

## 2017-12-19 MED ORDER — FUROSEMIDE 10 MG/ML IJ SOLN
80.0000 mg | Freq: Once | INTRAMUSCULAR | Status: AC
  Administered 2017-12-19: 80 mg via INTRAVENOUS
  Filled 2017-12-19: qty 8

## 2017-12-19 MED ORDER — AMLODIPINE BESYLATE 5 MG PO TABS
5.0000 mg | ORAL_TABLET | Freq: Every day | ORAL | Status: DC
  Administered 2017-12-19 – 2017-12-22 (×4): 5 mg via ORAL
  Filled 2017-12-19 (×4): qty 1

## 2017-12-19 MED ORDER — INSULIN ASPART 100 UNIT/ML ~~LOC~~ SOLN
0.0000 [IU] | Freq: Three times a day (TID) | SUBCUTANEOUS | Status: DC
  Administered 2017-12-19: 3 [IU] via SUBCUTANEOUS
  Administered 2017-12-20 – 2017-12-21 (×3): 4 [IU] via SUBCUTANEOUS
  Administered 2017-12-22: 3 [IU] via SUBCUTANEOUS

## 2017-12-19 MED ORDER — ALBUTEROL SULFATE HFA 108 (90 BASE) MCG/ACT IN AERS: 2.0000 | INHALATION_SPRAY | Freq: Four times a day (QID) | RESPIRATORY_TRACT | Status: DC | PRN

## 2017-12-19 MED ORDER — ONDANSETRON HCL 4 MG/2ML IJ SOLN: 4.0000 mg | Freq: Four times a day (QID) | INTRAMUSCULAR | Status: DC | PRN

## 2017-12-19 MED ORDER — INSULIN ASPART 100 UNIT/ML ~~LOC~~ SOLN: 0.0000 [IU] | Freq: Every day | SUBCUTANEOUS | Status: DC

## 2017-12-19 MED ORDER — INSULIN LISPRO 100 UNIT/ML (KWIKPEN)
4.0000 [IU] | PEN_INJECTOR | Freq: Three times a day (TID) | SUBCUTANEOUS | Status: DC
  Filled 2017-12-19 (×2): qty 3

## 2017-12-19 MED ORDER — INSULIN DETEMIR 100 UNIT/ML ~~LOC~~ SOLN
20.0000 [IU] | Freq: Every day | SUBCUTANEOUS | Status: DC
  Administered 2017-12-20 – 2017-12-22 (×3): 20 [IU] via SUBCUTANEOUS
  Filled 2017-12-19 (×3): qty 0.2

## 2017-12-19 MED ORDER — ATORVASTATIN CALCIUM 40 MG PO TABS
40.0000 mg | ORAL_TABLET | Freq: Every day | ORAL | Status: DC
  Administered 2017-12-19 – 2017-12-21 (×3): 40 mg via ORAL
  Filled 2017-12-19 (×3): qty 1

## 2017-12-19 MED ORDER — POTASSIUM CHLORIDE 10 MEQ/100ML IV SOLN
10.0000 meq | INTRAVENOUS | Status: AC
  Administered 2017-12-19 (×2): 10 meq via INTRAVENOUS
  Filled 2017-12-19 (×2): qty 100

## 2017-12-19 MED ORDER — LIVING BETTER WITH HEART FAILURE BOOK
Freq: Once | Status: AC
  Administered 2017-12-20: 02:00:00

## 2017-12-19 MED ORDER — ENOXAPARIN SODIUM 40 MG/0.4ML ~~LOC~~ SOLN
40.0000 mg | SUBCUTANEOUS | Status: DC
  Administered 2017-12-19 – 2017-12-21 (×3): 40 mg via SUBCUTANEOUS
  Filled 2017-12-19 (×3): qty 0.4

## 2017-12-19 MED ORDER — SODIUM CHLORIDE 0.9% FLUSH
3.0000 mL | INTRAVENOUS | Status: DC | PRN
  Administered 2017-12-20: 3 mL via INTRAVENOUS
  Filled 2017-12-19: qty 3

## 2017-12-19 MED ORDER — ISOSORB DINITRATE-HYDRALAZINE 20-37.5 MG PO TABS
1.0000 | ORAL_TABLET | Freq: Three times a day (TID) | ORAL | Status: DC
  Administered 2017-12-19 – 2017-12-22 (×10): 1 via ORAL
  Filled 2017-12-19 (×11): qty 1

## 2017-12-19 MED ORDER — SODIUM CHLORIDE 0.9% FLUSH
3.0000 mL | Freq: Two times a day (BID) | INTRAVENOUS | Status: DC
  Administered 2017-12-19 – 2017-12-22 (×6): 3 mL via INTRAVENOUS

## 2017-12-19 MED ORDER — ASPIRIN EC 81 MG PO TBEC
81.0000 mg | DELAYED_RELEASE_TABLET | Freq: Every day | ORAL | Status: DC
  Administered 2017-12-19 – 2017-12-22 (×4): 81 mg via ORAL
  Filled 2017-12-19 (×4): qty 1

## 2017-12-19 MED ORDER — AMLODIPINE BESYLATE 5 MG PO TABS
5.0000 mg | ORAL_TABLET | Freq: Once | ORAL | Status: AC
  Administered 2017-12-19: 5 mg via ORAL
  Filled 2017-12-19: qty 1

## 2017-12-19 MED ORDER — DORZOLAMIDE HCL-TIMOLOL MAL 2-0.5 % OP SOLN
1.0000 [drp] | Freq: Two times a day (BID) | OPHTHALMIC | Status: DC
  Administered 2017-12-19 – 2017-12-22 (×3): 1 [drp] via OPHTHALMIC
  Filled 2017-12-19 (×2): qty 10

## 2017-12-19 MED ORDER — HYDROCODONE-ACETAMINOPHEN 10-325 MG PO TABS
1.0000 | ORAL_TABLET | Freq: Four times a day (QID) | ORAL | Status: DC | PRN
  Administered 2017-12-19 – 2017-12-22 (×4): 1 via ORAL
  Filled 2017-12-19 (×4): qty 1

## 2017-12-19 NOTE — ED Triage Notes (Signed)
Pt brought in by [facility] from home for an increase in SOB since last night. Pt left [facility] AMA yesterday, pt states he did not like his care. Pt was admitted to [facility] for CHF exacerbation and sepsis related to appendicitis. Per EMS rales initially noted in all lung fields, initial room air sat 81% on arrival. Pt placed on nonrebreather, sat 100%, then placed on CPAP with improved of rales- now noted to be in lower lobes. Pt given x2 nitro PTA with relief. Pt states he takes lasix at home but has not had any today. Pt A+Ox4 and c/o being "cold". Pt temp 98.43F oral.

## 2017-12-19 NOTE — Progress Notes (Signed)
Received pt on unit from ED nurse [place].  Oriented pt to unit, vital signs stable, alert and oriented, provided orientation to unit, needs met.

## 2017-12-19 NOTE — ED Provider Notes (Signed)
[facility] EMERGENCY DEPARTMENT Provider Note   CSN: [id number] Arrival date & time: 12/19/17  7510     History   Chief Complaint Chief Complaint  Patient presents with  . C-PAP/CHF  . Shortness of Breath    HPI David [name] is a 72 y.o. male.  HPI    72 y.o.malewith a history of CAD s/p stenting Nov 2018, previous CHF with most recent echo normal, T2DM, stage II CKD, HTN with recent admission 11/24/17-12/09/17 with appendicitis. Surgery performed drainage of appendiceal abscess with drain placement 7/11. Developed ATN/AKI on CKD which improved prior to DC. He was dischargd to [facility]. He left AMA yesterday because he was unhappy with his care. He was concerned because he says they were administering medications based on prior records which didn't reflect most UTD recommendations and that it generally wasn't a restful place to recover in. He went home around 5p yesterday. Around 9p when he went to bed he developed shortness of breath which worsens throughout the night until he finally called EMS this morning.  He has had increasing swelling in his lower extremities.  He states that his legs feel tight and it is more difficult to bend his knees.  Occasional cough.  Denies any acute chest pain.  He states that his abdominal pain has been progressively improving.  Denies any fevers or chills.  He does endorse orthopnea. He had all his medications yesterday as recommended but has yet to take his morning medications today.  On EMS arrival patient was hypoxic on room air at 81%.  Decreased breath sounds bilaterally.  Oxygen saturations improved on her percent was placed on a nonrebreather and then he was transitioned to CPAP with improved aeration.  He also given 2 nitroglycerin which he does not feel sniffily helped the symptoms.  On my initial assessment he was on BiPAP and appeared to be comfortable.  He was able to speak in nearly complete sentences.    Past Medical  History:  Diagnosis Date  . Arthritis   . CAD in native artery    s/p stent in 11/18  . CHF (congestive heart failure) ([place])    normal echo in 11/18  . CKD (chronic kidney disease)   . DDD (degenerative disc disease), cervical   . Diabetes mellitus with complication (HCC)    Type II  . Diabetic neuropathy ([place])   . Diabetic retinopathy ([place])   . GERD (gastroesophageal reflux disease)   . Glaucoma   . Hypertension   . Renal disorder    Stage II    Patient Active Problem List   Diagnosis Date Noted  . At risk for adverse drug reaction 12/15/2017  . Chronic pain syndrome 12/15/2017  . Appendicitis 11/24/2017  . Acute on chronic congestive heart failure ([place])   . Status post coronary artery stent placement   . Acute renal failure superimposed on stage 3 chronic kidney disease ([place])   . Pneumonia 03/30/2017  . Insulin-requiring or dependent type II diabetes mellitus ([place]) 03/29/2017  . Acute hypoxemic respiratory failure ([place]) 03/29/2017  . Acute respiratory failure with hypoxia ([place])   . JVD (jugular venous distension)   . Pulmonary congestion   . Acquired contracture of Achilles tendon, right 08/19/2016  . Amputated great toe, right ([place]) 07/18/2016  . Onychomycosis 05/29/2016  . Diabetic polyneuropathy associated with type 2 diabetes mellitus ([place]) 04/09/2016  . Sepsis ([place]) 02/06/2015  . Unspecified hereditary and idiopathic peripheral neuropathy 09/15/2013  . Malaise 08/14/2013  .  Intractable nausea and vomiting 08/14/2013  . Acute gastroenteritis 08/14/2013  . Hypertension 08/14/2013  . Chronic back pain 08/14/2013  . Glaucoma 08/14/2013  . Headache 08/14/2013  . Hypertensive urgency 08/14/2013    Past Surgical History:  Procedure Laterality Date  . AMPUTATION Right 07/09/2016   Procedure: Right Great Toe Amputation at Metatarsophalangeal Joint;  Surgeon: [name], MD;  Location: [facility];  Service: Orthopedics;  Laterality: Right;  . BACK SURGERY     4  .  CARDIAC CATHETERIZATION    . CORONARY STENT INTERVENTION N/A 04/06/2017   Procedure: CORONARY STENT INTERVENTION;  Surgeon: Nigel Mormon, MD;  Location: [facility];  Service: Cardiovascular;  Laterality: N/A;  . CORONARY STENT INTERVENTION N/A 04/07/2017   Procedure: CORONARY STENT INTERVENTION;  Surgeon: Nigel Mormon, MD;  Location: [facility];  Service: Cardiovascular;  Laterality: N/A;  . CYST EXCISION     on Back  . JOINT REPLACEMENT    . LAPAROSCOPIC ABDOMINAL EXPLORATION N/A 11/26/2017   Procedure: LAPAROSCOPIC ABDOMINAL EXPLORATION, DRAINAGE OF APPENDICEAL ABCESS. PLACEMENT OF DRAIN;  Surgeon: Alphonsa Overall, MD;  Location: [facility];  Service: General;  Laterality: N/A;  . RIGHT/LEFT HEART CATH AND CORONARY ANGIOGRAPHY N/A 04/06/2017   Procedure: RIGHT/LEFT HEART CATH AND CORONARY ANGIOGRAPHY;  Surgeon: Nigel Mormon, MD;  Location: [facility];  Service: Cardiovascular;  Laterality: N/A;  . TOTAL HIP ARTHROPLASTY     Right         Home Medications    Prior to Admission medications   Medication Sig Start Date End Date Taking? Authorizing Provider  acetaminophen (TYLENOL) 325 MG tablet Take 2 tablets (650 mg total) by mouth every 6 (six) hours as needed for mild pain or moderate pain (or Fever >/= 101). 12/09/17   Patrecia Pour, MD  albuterol (PROVENTIL HFA;VENTOLIN HFA) 108 (90 Base) MCG/ACT inhaler Inhale 2 puffs into the lungs every 6 (six) hours as needed for wheezing or shortness of breath. 04/09/17   Barton Dubois, MD  ALPRAZolam Duanne Moron) 1 MG tablet Take 1 tablet (1 mg total) by mouth 2 (two) times daily as needed for anxiety. 12/09/17   Patrecia Pour, MD  amLODipine (NORVASC) 10 MG tablet Take 1 tablet (10 mg total) by mouth daily. 12/09/17   Patrecia Pour, MD  aspirin EC 81 MG tablet Take 81 mg by mouth daily.    [provider]  atorvastatin (LIPITOR) 40 MG tablet Take 40 mg by mouth at bedtime. 08/01/16   [provider]  clopidogrel (PLAVIX) 75 MG tablet Take 1 tablet (75 mg total) by mouth daily. 04/10/17   Barton Dubois, MD  dorzolamide-timolol (COSOPT) 22.3-6.8 MG/ML ophthalmic solution Place 1 drop into both eyes 2 (two) times daily. 11/17/17   [provider]  fluticasone (FLONASE) 50 MCG/ACT nasal spray Place 1 spray into both nostrils daily. 04/09/17   Barton Dubois, MD  gabapentin (NEURONTIN) 600 MG tablet Take 600 mg by mouth 2 (two) times daily.  01/31/15   [provider]  HUMALOG KWIKPEN 100 UNIT/ML KiwkPen Inject 4 Units into the skin 3 (three) times daily. 08/18/17   [provider]  Insulin Detemir (LEVEMIR FLEXTOUCH) 100 UNIT/ML Pen Inject 20 Units into the skin daily. 12/09/17   Patrecia Pour, MD  isosorbide-hydrALAZINE (BIDIL) 20-37.5 MG tablet Take 1 tablet by mouth 3 (three) times daily. 09/08/16   Ghimire, Henreitta Leber, MD  methocarbamol (ROBAXIN) 750 MG tablet Take 1 tablet (750  mg total) by mouth every 6 (six) hours as needed for muscle spasms. 09/08/16   Ghimire, Henreitta Leber, MD  metoprolol succinate (TOPROL-XL) 100 MG 24 hr tablet Take 1 tablet (100 mg total) by mouth daily. Take with or immediately following a meal. 04/10/17   Barton Dubois, MD  saccharomyces boulardii (FLORASTOR) 250 MG capsule Take 1 capsule (250 mg total) by mouth 2 (two) times daily. 12/09/17   Patrecia Pour, MD  traMADol (ULTRAM) 50 MG tablet Take 1 tablet (50 mg total) by mouth every 6 (six) hours as needed for severe pain. 12/09/17   Patrecia Pour, MD    Family History Family History  Problem Relation Age of Onset  . Diabetes Other   . Hyperlipidemia Other   . Hypertension Other   . Stroke Other   . Alzheimer's disease Other   . Thyroid disease Mother   . Diabetes Mellitus II Father     Social History Social History   Tobacco Use  . Smoking status: Never Smoker  . Smokeless tobacco: Never Used  Substance Use Topics  . Alcohol use: Not Currently  . Drug use: No     Allergies     Patient has no known allergies.   Review of Systems Review of Systems  All systems reviewed and negative, other than as noted in HPI.  Physical Exam Updated Vital Signs BP (!) 186/91   Pulse 82   Temp 98.3 F (36.8 C) (Oral)   Resp (!) 21   Ht 6\' 2"  (1.88 m)   Wt 106.1 kg (234 lb)   SpO2 100%   BMI 30.04 kg/m   Physical Exam  Constitutional: He appears well-developed and well-nourished.  HENT:  Head: Normocephalic and atraumatic.  jvd  Eyes: Conjunctivae are normal. Right eye exhibits no discharge. Left eye exhibits no discharge.  Neck: Neck supple.  Cardiovascular: Normal rate, regular rhythm and normal heart sounds. Exam reveals no gallop and no friction rub.  No murmur heard. Pulmonary/Chest: Tachypnea noted. No respiratory distress. He has decreased breath sounds.  Decreased breath sounds b/l. Bipap.   Abdominal: Soft. He exhibits no distension. There is no tenderness.  Musculoskeletal: He exhibits no tenderness.       Right lower leg: He exhibits edema.  Neurological: He is alert.  Skin: Skin is warm and dry.  Psychiatric: He has a normal mood and affect. His behavior is normal. Thought content normal.  Nursing note and vitals reviewed.    ED Treatments / Results  Labs (all labs ordered are listed, but only abnormal results are displayed) Labs Reviewed  BASIC METABOLIC PANEL - Abnormal; Notable for the following components:      Result Value   BUN 28 (*)    Creatinine, Ser 1.82 (*)    Calcium 8.6 (*)    GFR calc non Af Amer 36 (*)    GFR calc Af Amer 42 (*)    All other components within normal limits  CBC - Abnormal; Notable for the following components:   RBC 2.50 (*)    Hemoglobin 8.3 (*)    HCT 27.1 (*)    MCV 108.4 (*)    All other components within normal limits  BRAIN NATRIURETIC PEPTIDE - Abnormal; Notable for the following components:   B Natriuretic Peptide 458.5 (*)    All other components within normal limits  I-STAT TROPONIN, ED     EKG EKG Interpretation  Date/Time:  Saturday December 19 2017 08:33:36 EDT Ventricular Rate:  86  PR Interval:    QRS Duration: 88 QT Interval:  405 QTC Calculation: 485 R Axis:   53 Text Interpretation:  Sinus rhythm Prolonged PR interval Probable left atrial enlargement RSR' in V1 or V2, probably normal variant Non-specific ST-t changes Borderline prolonged QT interval similar to previous EKGg from 11/24/17 Confirmed by Virgel Manifold [phone]) on 12/19/2017 8:43:29 AM   Radiology Dg Chest Port 1 View  Result Date: 12/19/2017 CLINICAL DATA:  Shortness of breath EXAM: PORTABLE CHEST 1 VIEW COMPARISON:  11/24/2017 FINDINGS: Cardiac shadow is enlarged but stable. Increased central vascular congestion is noted as well as some atelectatic changes. No bony abnormality is seen. IMPRESSION: Changes of mild CHF without definitive edema. Mild atelectatic changes are noted. Electronically Signed   By: Inez Catalina M.D.   On: 12/19/2017 09:09    Procedures Procedures (including critical care time)  Medications Ordered in ED Medications  clopidogrel (PLAVIX) tablet 75 mg (has no administration in time range)  isosorbide-hydrALAZINE (BIDIL) 20-37.5 MG per tablet 1 tablet (has no administration in time range)  metoprolol succinate (TOPROL-XL) 24 hr tablet 100 mg (has no administration in time range)  amLODipine (NORVASC) tablet 5 mg (has no administration in time range)  furosemide (LASIX) injection 80 mg (has no administration in time range)     Initial Impression / Assessment and Plan / ED Course  I have reviewed the triage vital signs and the nursing notes.  Pertinent labs & imaging results that were available during my care of the patient were reviewed by me and considered in my medical decision making (see chart for details).     72 year old male with acute respiratory failure with hypoxia.  Likely related to heart failure.  He does appear to be volume overloaded. JVD.  He increased  reports increasing lower extremity edema.  He is anemic, but his hemoglobin is improved from 10 days ago.  EKG looks stable as compared to prior.  He denies any acute pain.  He now appears comfortable on BiPAP.  We will see if we can transition him to a nasal cannula.  He arrived very hypertensive. He missed his am meds.  His home medications were ordered in addition to a higher dose of IV Lasix. Consider more aggressive BP management if doesn't begin to come down with this.  10:36 AM Pt was placed in nasal cannula but became increasingly symptomatic and placed back on bipap.   Final Clinical Impressions(s) / ED Diagnoses   Final diagnoses:  Acute on chronic congestive heart failure, unspecified heart failure type [facility])    ED Discharge Orders    None       Virgel Manifold, MD 12/23/17 1539

## 2017-12-19 NOTE — ED Notes (Signed)
Called lab- stated they can add on BNP

## 2017-12-19 NOTE — ED Notes (Signed)
EDP at bedside  

## 2017-12-19 NOTE — ED Notes (Signed)
Admitting provider at bedside.

## 2017-12-19 NOTE — H&P (Addendum)
History and Physical    David [name] TDD:[id number] DOB: [birth year] DOA: 12/19/2017  PCP: [name], MD  Patient coming from: Home   Chief Complaint: SOB  HPI: David [name] is a 72 y.o. male with medical history significant of CKD, HTN, GERD, HFpEF (concentric hypertrophy, diastolic parameters normal on latest echo; previously 53 - 45%), CAD, DM2 who presents for SOB.  David [name] was recently admitted from 7/9 - 7/24 for acute appendicitis s/p surgery.  He had sepsis, ATN which improved with fluids. PT recommended SNF placement.  He was in [place] until yesterday.  He was not happy with his care there and so checked himself out yesterday.  He reports that on discharge from the hospital, his lasix was held, which is confirmed in the discharge summary, due to renal dysfunction.  Starting Tuesday, he developed SOB and required oxygen.  He felt like he was getting more and more volume overloaded and requested his lasix, but was not given it.  He went to see one of his doctors on Monday who was going to order lasix for every 2-3 days or PRN, but this order was not completed. He also has chronic back pain and he was not started on his chronic hydrocodone which led to some worsening of his back pain.  All of these issues led [facility]  Since being admitted there, he has had progressive LE swelling, SOB and feeling like his lungs were "filling with water."   He denies chest pain, fever, chills, recent illness, nausea, vomiting, diarrhea, constipation. He has a wound on his right shin which has not healed well.  These tend to come on when he is volume overloaded with has edema in his legs.   ED Course: In the ED, he was found to have a stable Cr from discharge; WBC was 9.7, H/H were 8.3 and 27 which were also stable, with an elevated MCV of 108.  Initial troponin was 0.00 and BNP was 458 ( up from 119 in November of last year ).  CXR showed likely mild CHF.  EKG showed sinus rhythm  with a prolonged PR.  The EDP attempted to titrate him off of CPAP, but he became more tachypneic and anxious and so was placed back on the mask. He has received some of his blood pressure medications, including beta blocker, and lasix 80mg  IV X 1 with some improvement in symptoms.    Review of Systems: As per HPI otherwise 10 point review of systems negative.    Past Medical History:  Diagnosis Date  . Arthritis   . CAD in native artery    s/p stent in 11/18  . CHF (congestive heart failure) ([place])    normal echo in 11/18  . CKD (chronic kidney disease)   . DDD (degenerative disc disease), cervical   . Diabetes mellitus with complication (HCC)    Type II  . Diabetic neuropathy ([place])   . Diabetic retinopathy ([place])   . GERD (gastroesophageal reflux disease)   . Glaucoma   . Hypertension   . Renal disorder    Stage II    Past Surgical History:  Procedure Laterality Date  . AMPUTATION Right 07/09/2016   Procedure: Right Great Toe Amputation at Metatarsophalangeal Joint;  Surgeon: [name], MD;  Location: [facility];  Service: Orthopedics;  Laterality: Right;  . BACK SURGERY     4  . CARDIAC CATHETERIZATION    . CORONARY STENT INTERVENTION N/A 04/06/2017  Procedure: CORONARY STENT INTERVENTION;  Surgeon: Nigel Mormon, MD;  Location: [facility];  Service: Cardiovascular;  Laterality: N/A;  . CORONARY STENT INTERVENTION N/A 04/07/2017   Procedure: CORONARY STENT INTERVENTION;  Surgeon: Nigel Mormon, MD;  Location: [facility];  Service: Cardiovascular;  Laterality: N/A;  . CYST EXCISION     on Back  . JOINT REPLACEMENT    . LAPAROSCOPIC ABDOMINAL EXPLORATION N/A 11/26/2017   Procedure: LAPAROSCOPIC ABDOMINAL EXPLORATION, DRAINAGE OF APPENDICEAL ABCESS. PLACEMENT OF DRAIN;  Surgeon: Alphonsa Overall, MD;  Location: [facility];  Service: General;  Laterality: N/A;  . RIGHT/LEFT HEART CATH AND CORONARY ANGIOGRAPHY N/A 04/06/2017   Procedure: RIGHT/LEFT HEART CATH  AND CORONARY ANGIOGRAPHY;  Surgeon: Nigel Mormon, MD;  Location: [facility];  Service: Cardiovascular;  Laterality: N/A;  . TOTAL HIP ARTHROPLASTY     Right      reports that he has never smoked. He has never used smokeless tobacco. He reports that he drank alcohol. He reports that he does not use drugs.  No Known Allergies  Family History  Problem Relation Age of Onset  . Diabetes Other   . Hyperlipidemia Other   . Hypertension Other   . Stroke Other   . Alzheimer's disease Other   . Thyroid disease Mother   . Diabetes Mellitus II Father      Prior to Admission medications   Medication Sig Start Date End Date Taking? Authorizing Provider  acetaminophen (TYLENOL) 325 MG tablet Take 2 tablets (650 mg total) by mouth every 6 (six) hours as needed for mild pain or moderate pain (or Fever >/= 101). 12/09/17  Yes Patrecia Pour, MD  albuterol (PROVENTIL HFA;VENTOLIN HFA) 108 (90 Base) MCG/ACT inhaler Inhale 2 puffs into the lungs every 6 (six) hours as needed for wheezing or shortness of breath. 04/09/17  Yes Barton Dubois, MD  ALPRAZolam Duanne Moron) 1 MG tablet Take 1 tablet (1 mg total) by mouth 2 (two) times daily as needed for anxiety. 12/09/17  Yes Patrecia Pour, MD  amLODipine (NORVASC) 10 MG tablet Take 1 tablet (10 mg total) by mouth daily. 12/09/17  Yes Patrecia Pour, MD  aspirin EC 81 MG tablet Take 81 mg by mouth daily.    [provider]  atorvastatin (LIPITOR) 40 MG tablet Take 40 mg by mouth at bedtime. 08/01/16   [provider]  clopidogrel (PLAVIX) 75 MG tablet Take 1 tablet (75 mg total) by mouth daily. 04/10/17   Barton Dubois, MD  dorzolamide-timolol (COSOPT) 22.3-6.8 MG/ML ophthalmic solution Place 1 drop into both eyes 2 (two) times daily. 11/17/17   [provider]  fluticasone (FLONASE) 50 MCG/ACT nasal spray Place 1 spray into both nostrils daily. 04/09/17   Barton Dubois, MD  gabapentin (NEURONTIN) 600 MG tablet Take 600 mg by  mouth 2 (two) times daily.  01/31/15   [provider]  HUMALOG KWIKPEN 100 UNIT/ML KiwkPen Inject 4 Units into the skin 3 (three) times daily. 08/18/17   [provider]  Insulin Detemir (LEVEMIR FLEXTOUCH) 100 UNIT/ML Pen Inject 20 Units into the skin daily. 12/09/17   Patrecia Pour, MD  isosorbide-hydrALAZINE (BIDIL) 20-37.5 MG tablet Take 1 tablet by mouth 3 (three) times daily. 09/08/16   Ghimire, Henreitta Leber, MD  methocarbamol (ROBAXIN) 750 MG tablet Take 1 tablet (750 mg total) by mouth every 6 (six) hours as needed for muscle spasms. 09/08/16   Ghimire, Henreitta Leber, MD  metoprolol succinate (TOPROL-XL)  100 MG 24 hr tablet Take 1 tablet (100 mg total) by mouth daily. Take with or immediately following a meal. 04/10/17   Barton Dubois, MD  saccharomyces boulardii (FLORASTOR) 250 MG capsule Take 1 capsule (250 mg total) by mouth 2 (two) times daily. 12/09/17   Patrecia Pour, MD  traMADol (ULTRAM) 50 MG tablet Take 1 tablet (50 mg total) by mouth every 6 (six) hours as needed for severe pain. 12/09/17   Patrecia Pour, MD    Physical Exam:  Constitutional: NAD, calm, comfortable Vitals:   12/19/17 1015 12/19/17 1030 12/19/17 1045 12/19/17 1100  BP: (!) 156/111 (!) 162/75 (!) 149/70 (!) 152/76  Pulse: 72 68 65 66  Resp: 17 19 15 19   Temp:      TempSrc:      SpO2: 100% 100% 100% 100%  Weight:      Height:       Eyes: PERRL, lids and conjunctivae normal ENMT: Mucous membranes are moist. Normal dentition.  BIPAP mask in place.  Neck: normal, supple, unable to assess JVD due to BIPAP mask Respiratory: Rales to mid lungs, otherwise clear, no wheezing Cardiovascular: RR, NR, no murmur.  Heart sounds were distant Abdomen: + BS, NT, ND Musculoskeletal: He has 2+ pitting edema to the bilateral mid calves.  He has post surgical changes to the right foot with the great toe removed.  He has darkened toenails and chronic wounds to feet.  Skin: He has a wound to right anterior calf, he has  chronic skin changes related to venous insufficiency.  He has a blackened spot on the distal site of previous surgery, non tender.  He has some blackened toenails.  Pulses were faint, but able to be palpated in DP Neurologic: Grossly normal, moving all extremities well.  Psychiatric: Normal judgment and insight. Alert and oriented x 3. Normal mood.     Labs on Admission: I have personally reviewed following labs and imaging studies  CBC: Recent Labs  Lab 12/19/17 0835  WBC 9.7  HGB 8.3*  HCT 27.1*  MCV 108.4*  PLT 485   Basic Metabolic Panel: Recent Labs  Lab 12/19/17 0835  NA 143  K 3.8  CL 109  CO2 23  GLUCOSE 97  BUN 28*  CREATININE 1.82*  CALCIUM 8.6*   GFR: Estimated Creatinine Clearance: 49 mL/min (A) (by C-G formula based on SCr of 1.82 mg/dL (H)). Liver Function Tests: No results for input(s): AST, ALT, ALKPHOS, BILITOT, PROT, ALBUMIN in the last 168 hours. No results for input(s): LIPASE, AMYLASE in the last 168 hours. No results for input(s): AMMONIA in the last 168 hours. Coagulation Profile: No results for input(s): INR, PROTIME in the last 168 hours. Cardiac Enzymes: No results for input(s): CKTOTAL, CKMB, CKMBINDEX, TROPONINI in the last 168 hours. BNP (last 3 results) No results for input(s): PROBNP in the last 8760 hours. HbA1C: No results for input(s): HGBA1C in the last 72 hours. CBG: No results for input(s): GLUCAP in the last 168 hours. Lipid Profile: No results for input(s): CHOL, HDL, LDLCALC, TRIG, CHOLHDL, LDLDIRECT in the last 72 hours. Thyroid Function Tests: No results for input(s): TSH, T4TOTAL, FREET4, T3FREE, THYROIDAB in the last 72 hours. Anemia Panel: No results for input(s): VITAMINB12, FOLATE, FERRITIN, TIBC, IRON, RETICCTPCT in the last 72 hours. Urine analysis:    Component Value Date/Time   COLORURINE AMBER (A) 11/27/2017 1313   APPEARANCEUR CLOUDY (A) 11/27/2017 1313   LABSPEC 1.021 11/27/2017 1313   PHURINE 5.0  11/27/2017 1313   GLUCOSEU NEGATIVE 11/27/2017 1313   HGBUR MODERATE (A) 11/27/2017 1313   BILIRUBINUR NEGATIVE 11/27/2017 1313   KETONESUR NEGATIVE 11/27/2017 1313   PROTEINUR 100 (A) 11/27/2017 1313   UROBILINOGEN 1.0 02/13/2015 1934   NITRITE NEGATIVE 11/27/2017 1313   LEUKOCYTESUR NEGATIVE 11/27/2017 1313    Radiological Exams on Admission: Dg Chest Port 1 View  Result Date: 12/19/2017 CLINICAL DATA:  Shortness of breath EXAM: PORTABLE CHEST 1 VIEW COMPARISON:  11/24/2017 FINDINGS: Cardiac shadow is enlarged but stable. Increased central vascular congestion is noted as well as some atelectatic changes. No bony abnormality is seen. IMPRESSION: Changes of mild CHF without definitive edema. Mild atelectatic changes are noted. Electronically Signed   By: Inez Catalina M.D.   On: 12/19/2017 09:09    EKG: Independently reviewed. NSR with a prolonged PR interval.   Assessment/Plan Acute hypoxemic respiratory failure due to heart failure - CXR with edema, improved with lasix and BIPAP - Requiring oxygen at the SNF, but I think this is also related to volume issues - Wean off of Bipap and then oxygen - Lasix dosage as noted below  Acute on chronic congestive heart failure 2/2 medication changes, not on diuretic - Patient has had difficult to control volume status due to CKD.  He might have cardio-renal syndrome.  Family reports that he was taken off of lasix prior to last admission, and that he was recently told he should likely take it every 2-3 days.  He has not been taking it at the SNF or during last hospitalization from what I can tell - Lasix 80mg  IV X 1 was given - Lasix IV 20mg  BID for at least 24 hours - Monitor K + - BMET in the AM - Not on ace or arb due to renal dysfunction, on Bidil which was continued - Continue statin, plavix, metoprolol - Strict I/O - Daily weights - Consider HF consult if not improving as expected    Hypertension - BP  High here, most recent 155/73 -  Resume home Bidil, metoprolol - Start lasix as above    Chronic back pain, follows with pain clinic - Previously stopped off of chronic hydrocodone, will restart at this time to help with pain management.     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  - Consider checking smear if not already done  CKD stage 3 - Chronic issue, making diuresis challenging for him - Daily BMET while on diuresis - Previous ATN during last hospital stay  Anxiety - On home alprazolam, taking 0.5mg  as needed but may be too strong for him - Start on 0.25mg  of alprazolam PRN to see if this helps  Shin wound, skin changes on feet - Wound care consult.    DVT prophylaxis: Lovenox Code Status: Full Family Communication: Wife at bedside Disposition Plan: Admit for diuresis Consults called: None, consider cardiology if not doing well Admission status: SDU, inpatient   Gilles Chiquito MD [facility] Pager [phone]  If 7PM-7AM, please contact night-coverage  [email or web address] Password [email or web address]  12/19/2017, 11:28 AM

## 2017-12-20 ENCOUNTER — Inpatient Hospital Stay (HOSPITAL_COMMUNITY): Payer: Medicare Other

## 2017-12-20 DIAGNOSIS — I10 Essential (primary) hypertension: Secondary | ICD-10-CM | POA: Diagnosis not present

## 2017-12-20 DIAGNOSIS — E1122 Type 2 diabetes mellitus with diabetic chronic kidney disease: Secondary | ICD-10-CM | POA: Diagnosis not present

## 2017-12-20 DIAGNOSIS — N183 Chronic kidney disease, stage 3 (moderate): Secondary | ICD-10-CM | POA: Diagnosis not present

## 2017-12-20 DIAGNOSIS — I509 Heart failure, unspecified: Secondary | ICD-10-CM | POA: Diagnosis not present

## 2017-12-20 DIAGNOSIS — E1142 Type 2 diabetes mellitus with diabetic polyneuropathy: Secondary | ICD-10-CM | POA: Diagnosis not present

## 2017-12-20 DIAGNOSIS — Z794 Long term (current) use of insulin: Secondary | ICD-10-CM

## 2017-12-20 DIAGNOSIS — J9601 Acute respiratory failure with hypoxia: Secondary | ICD-10-CM

## 2017-12-20 DIAGNOSIS — E119 Type 2 diabetes mellitus without complications: Secondary | ICD-10-CM

## 2017-12-20 DIAGNOSIS — I130 Hypertensive heart and chronic kidney disease with heart failure and stage 1 through stage 4 chronic kidney disease, or unspecified chronic kidney disease: Secondary | ICD-10-CM | POA: Diagnosis not present

## 2017-12-20 DIAGNOSIS — I5033 Acute on chronic diastolic (congestive) heart failure: Secondary | ICD-10-CM | POA: Diagnosis not present

## 2017-12-20 LAB — BASIC METABOLIC PANEL
Anion gap: 8 (ref 5–15)
BUN: 29 mg/dL — AB (ref 8–23)
CO2: 27 mmol/L (ref 22–32)
CREATININE: 1.77 mg/dL — AB (ref 0.61–1.24)
Calcium: 8.2 mg/dL — ABNORMAL LOW (ref 8.9–10.3)
Chloride: 107 mmol/L (ref 98–111)
GFR calc Af Amer: 43 mL/min — ABNORMAL LOW (ref >60)
GFR, EST NON AFRICAN AMERICAN: 37 mL/min — AB (ref >60)
GLUCOSE: 147 mg/dL — AB (ref 70–99)
Potassium: 3.9 mmol/L (ref 3.5–5.1)
SODIUM: 142 mmol/L (ref 135–145)

## 2017-12-20 LAB — GLUCOSE, CAPILLARY
GLUCOSE-CAPILLARY: 158 mg/dL — AB (ref 70–99)
GLUCOSE-CAPILLARY: 97 mg/dL (ref 70–99)
Glucose-Capillary: 142 mg/dL — ABNORMAL HIGH (ref 70–99)
Glucose-Capillary: 173 mg/dL — ABNORMAL HIGH (ref 70–99)

## 2017-12-20 NOTE — Progress Notes (Signed)
PROGRESS NOTE    David [name]  XQJ:[id number] DOB: July 27, 1945 DOA: 12/19/2017 PCP: [name], MD   Brief Narrative:  David [name] is a 72 y.o. male with medical history significant of CKD, HTN, GERD, HFpEF (concentric hypertrophy, diastolic parameters normal on latest echo; previously 71 - 45%), CAD, DM2 who presents for SOB.  David [name] was recently admitted from 7/9 - 7/24 for acute appendicitis s/p surgery.  He had sepsis, ATN which improved with fluids. PT recommended SNF placement.  He was in [place] until 8/2.  He was not happy with his care there and so checked himself out yesterday.  He reports that on discharge from the hospital, his lasix was held, which is confirmed in the discharge summary, due to renal dysfunction. Since then pt reports having sob and he ws admitted for acute respiratory failure with hypoxia.     Assessment & Plan:   Active Problems:   Hypertension   Chronic back pain   Diabetic polyneuropathy associated with type 2 diabetes mellitus ([place])   Insulin-requiring or dependent type II diabetes mellitus ([place])   Acute hypoxemic respiratory failure (HCC)   Acute on chronic congestive heart failure (HCC)   Acute on chronic heart failure (HCC)   Acute respiratory failure with hypoxia secondary to acute on chronic CHF. Last echo showed normal LVEF and normal diastolic function.  Repeat echocardiogram this admission.  Pt reports his cardiologist asked to hold the lasix as his creatinine is worsening.  He initially required BIPAP and now transitioned to [place] oxygen.  Started him on IV lasix 20 mg BID. diuresed about 3 lit since admission.  Continue with strict intake and output.  Daily weights.  Monitor renal parameters while on IV lasix.  Replete potassium as needed.     CAD: NO CHEST PAIN.  Resume aspirin and plavix.     Insulin requiring DM: CBG (last 3)  Recent Labs    12/19/17 2112 12/20/17 0640 12/20/17 1219  GLUCAP 175* 158* 142*    resume SSI and levemir.    Hypertension: well controlled.   Stage 3 CKD: Creatinine is 1.7 at baseline.    GERD stable.    Chronic back pain: Controlled.     DVT prophylaxis: lovenox.  Code Status:  Full code.  Family Communication: (none at bedside.  Disposition Plan:  Pending further clinical improvement.   Consultants:   None.   Procedures: None.  Antimicrobials:none.   Subjective: No chest pain or sob. Breathing has improved.   Objective: Vitals:   12/20/17 0051 12/20/17 0502 12/20/17 0822 12/20/17 0939  BP: (!) 151/70 (!) 158/69 (!) 164/84 (!) 151/69  Pulse: (!) 59 (!) 58  62  Resp: 20 16 15    Temp: 98.7 F (37.1 C) 98 F (36.7 C) 98.3 F (36.8 C)   TempSrc: Axillary Oral Oral   SpO2: 100% 98% 98%   Weight:      Height:        Intake/Output Summary (Last 24 hours) at 12/20/2017 1545 Last data filed at 12/20/2017 1300 Gross per 24 hour  Intake 940 ml  Output 2100 ml  Net -1160 ml   Filed Weights   12/19/17 0840 12/19/17 1245  Weight: 106.1 kg (234 lb) 105.6 kg (232 lb 12.8 oz)    Examination:  General exam: Appears calm and comfortable  Respiratory system: Clear to auscultation. Respiratory effort normal. Cardiovascular system: S1 & S2 heard, RRR.Marland Kitchen No pedal edema. Gastrointestinal system: Abdomen is nondistended, soft and nontender. No organomegaly  or masses felt. Normal bowel sounds heard. Central nervous system: Alert and oriented. No focal neurological deficits. Extremities: Symmetric 5 x 5 power.pedal edema present.  Skin: No rashes, lesions or ulcers Psychiatry:  Mood & affect appropriate.     Data Reviewed: I have personally reviewed following labs and imaging studies  CBC: Recent Labs  Lab 12/19/17 0835  WBC 9.7  HGB 8.3*  HCT 27.1*  MCV 108.4*  PLT 782   Basic Metabolic Panel: Recent Labs  Lab 12/19/17 0835 12/20/17 0312  NA 143 142  K 3.8 3.9  CL 109 107  CO2 23 27  GLUCOSE 97 147*  BUN 28* 29*  CREATININE  1.82* 1.77*  CALCIUM 8.6* 8.2*   GFR: Estimated Creatinine Clearance: 50.3 mL/min (A) (by C-G formula based on SCr of 1.77 mg/dL (H)). Liver Function Tests: No results for input(s): AST, ALT, ALKPHOS, BILITOT, PROT, ALBUMIN in the last 168 hours. No results for input(s): LIPASE, AMYLASE in the last 168 hours. No results for input(s): AMMONIA in the last 168 hours. Coagulation Profile: No results for input(s): INR, PROTIME in the last 168 hours. Cardiac Enzymes: No results for input(s): CKTOTAL, CKMB, CKMBINDEX, TROPONINI in the last 168 hours. BNP (last 3 results) No results for input(s): PROBNP in the last 8760 hours. HbA1C: Recent Labs    12/19/17 1343  HGBA1C 6.3*   CBG: Recent Labs  Lab 12/19/17 1617 12/19/17 2112 12/20/17 0640 12/20/17 1219  GLUCAP 137* 175* 158* 142*   Lipid Profile: No results for input(s): CHOL, HDL, LDLCALC, TRIG, CHOLHDL, LDLDIRECT in the last 72 hours. Thyroid Function Tests: Recent Labs    12/19/17 1343  TSH 1.906   Anemia Panel: No results for input(s): VITAMINB12, FOLATE, FERRITIN, TIBC, IRON, RETICCTPCT in the last 72 hours. Sepsis Labs: No results for input(s): PROCALCITON, LATICACIDVEN in the last 168 hours.  No results found for this or any previous visit (from the past 240 hour(s)).       Radiology Studies: Dg Chest Port 1 View  Result Date: 12/20/2017 CLINICAL DATA:  Acute on chronic heart failure EXAM: PORTABLE CHEST 1 VIEW COMPARISON:  12/19/2017 FINDINGS: Cardiac shadow is stable. Mild vascular congestion remains although improved when compare with the prior study. No focal confluent infiltrate is seen. No bony abnormality is noted. IMPRESSION: Improving vascular congestion when compared with the prior study. Electronically Signed   By: Inez Catalina M.D.   On: 12/20/2017 07:57   Dg Chest Port 1 View  Result Date: 12/19/2017 CLINICAL DATA:  Shortness of breath EXAM: PORTABLE CHEST 1 VIEW COMPARISON:  11/24/2017 FINDINGS:  Cardiac shadow is enlarged but stable. Increased central vascular congestion is noted as well as some atelectatic changes. No bony abnormality is seen. IMPRESSION: Changes of mild CHF without definitive edema. Mild atelectatic changes are noted. Electronically Signed   By: Inez Catalina M.D.   On: 12/19/2017 09:09        Scheduled Meds: . amLODipine  5 mg Oral Daily  . aspirin EC  81 mg Oral Daily  . atorvastatin  40 mg Oral QHS  . clopidogrel  75 mg Oral Daily  . dorzolamide-timolol  1 drop Both Eyes BID  . enoxaparin (LOVENOX) injection  40 mg Subcutaneous Q24H  . fluticasone  1 spray Each Nare Daily  . furosemide  20 mg Intravenous Q12H  . gabapentin  600 mg Oral BID  . insulin aspart  0-20 Units Subcutaneous TID WC  . insulin aspart  0-5 Units Subcutaneous QHS  .  insulin aspart  4 Units Subcutaneous TID WC  . insulin detemir  20 Units Subcutaneous Daily  . isosorbide-hydrALAZINE  1 tablet Oral TID  . metoprolol succinate  100 mg Oral Daily  . sodium chloride flush  3 mL Intravenous Q12H   Continuous Infusions: . sodium chloride       LOS: 1 day    Time spent: 35 minutes.     Hosie Poisson, MD [facility] Pager [phone]  If 7PM-7AM, please contact night-coverage [email or web address] Password [facility] 12/20/2017, 3:45 PM

## 2017-12-21 DIAGNOSIS — I509 Heart failure, unspecified: Secondary | ICD-10-CM | POA: Diagnosis not present

## 2017-12-21 DIAGNOSIS — E1142 Type 2 diabetes mellitus with diabetic polyneuropathy: Secondary | ICD-10-CM | POA: Diagnosis not present

## 2017-12-21 DIAGNOSIS — I10 Essential (primary) hypertension: Secondary | ICD-10-CM | POA: Diagnosis not present

## 2017-12-21 DIAGNOSIS — J9601 Acute respiratory failure with hypoxia: Secondary | ICD-10-CM | POA: Diagnosis not present

## 2017-12-21 LAB — BASIC METABOLIC PANEL
Anion gap: 10 (ref 5–15)
BUN: 25 mg/dL — ABNORMAL HIGH (ref 8–23)
CALCIUM: 8.5 mg/dL — AB (ref 8.9–10.3)
CHLORIDE: 101 mmol/L (ref 98–111)
CO2: 28 mmol/L (ref 22–32)
CREATININE: 1.71 mg/dL — AB (ref 0.61–1.24)
GFR calc non Af Amer: 39 mL/min — ABNORMAL LOW (ref >60)
GFR, EST AFRICAN AMERICAN: 45 mL/min — AB (ref >60)
Glucose, Bld: 180 mg/dL — ABNORMAL HIGH (ref 70–99)
Potassium: 4.4 mmol/L (ref 3.5–5.1)
SODIUM: 139 mmol/L (ref 135–145)

## 2017-12-21 LAB — GLUCOSE, CAPILLARY
GLUCOSE-CAPILLARY: 108 mg/dL — AB (ref 70–99)
GLUCOSE-CAPILLARY: 164 mg/dL — AB (ref 70–99)
Glucose-Capillary: 66 mg/dL — ABNORMAL LOW (ref 70–99)
Glucose-Capillary: 105 mg/dL — ABNORMAL HIGH (ref 70–99)

## 2017-12-21 MED ORDER — MUPIROCIN CALCIUM 2 % EX CREA
TOPICAL_CREAM | Freq: Every day | CUTANEOUS | Status: DC
  Administered 2017-12-21 – 2017-12-22 (×2): via TOPICAL
  Filled 2017-12-21: qty 15

## 2017-12-21 NOTE — Consult Note (Signed)
[facility] Nurse wound consult note Reason for Consult: Consult requested for BLE.  Wound type: Pt had previous amputation of right great toe and has calluses to the plantar surface of his foot under this location related to chronic weight distribution when walking. Darker-colored skin, slightly raised above skin level, dry and intact without open wound, drainage, or fluctuance.  No topical treatment is indicated.  Encouraged patient to follow-up with a podiatrist to keep calluses trimmed. Measurement: Right calf with chronic stasis ulcer; 3X1X.1cm, red moist wound bed, no odor, small amt yellow drainage Left great toe tip with partial thickness abrasion; .5X.5X.1cm, red moist wound bed, no odor, small amt bloody drainage. Dressing procedure/placement/frequency: Bactroban to promote moist healing to left toe.  Foam dressing to right calf to protect and promote healing.  Discussed plan of care with patient and family at the bedside. Please re-consult if further assistance is needed.  Thank-you,  Julien Girt MSN, [place], [place], [place], [place]

## 2017-12-21 NOTE — Progress Notes (Signed)
Pt refused am labs. Stated its not happening today, I just have my breathing issues, my blood work is fine. Reeducated pt, but pt still refused. Will continue to monitor.

## 2017-12-21 NOTE — Care Management Note (Addendum)
Case Management Note  Patient Details  Name: David [name] MRN: [id number] Date of Birth: December 16, 1945  Subjective/Objective:              Patient admitted with CHF, HTN. Patient was DC'd to [facility] 7/15, left AMA and was home less than 24 hours prior to readmission.      Action/Plan:  Spoke to patient, wife and daughter at bedside. Patient wife was visibly upset during conversation with spouse stating she was "beyond frustrated" because he is "stubborn."  CM began conversation with establishing mutual goal of him getting home and being able to stay there without being readmitted.  When talking to patient he is fixated on getting supplemental oxygen at home, stating that is what he needs to prevent readmission. Had lengthy discussion of CHF and self care. Discussed diet, medication compliance especially diuretics for which his wife explains he does not take as prescribed. Discussed daily weights and how close management of these things will prevent the feelings of needing oxygen and being short of breath. Patient returned the conversation repeatedly back to O2 until he was unwilling to continue the conversation. He stated several times that African Americans are not prescribed oxygen when they need it, that he this was a research article. I told him I was not familiar with this research and said he found it online. I reassured him if he needed it he would get it, however he currently did not.  HH with telemedicine would be ideal, his wife is supportive of this, however I do not feel I could get patient to agree to it today. Will continue to follow and re approach closer to DC.   Expected Discharge Date:  12/22/17               Expected Discharge Plan:  [place]  In-House Referral:     Discharge planning Services  CM Consult  Post Acute Care Choice:    Choice offered to:     DME Arranged:    DME Agency:     HH Arranged:    HH Agency:     Status of Service:  In  process, will continue to follow  If discussed at Long Length of Stay Meetings, dates discussed:    Additional Comments:  Carles Collet, RN 12/21/2017, 1:33 PM

## 2017-12-21 NOTE — Progress Notes (Signed)
PROGRESS NOTE    David [name]  QGB:[id number] DOB: 1946/04/10 DOA: 12/19/2017 PCP: [name], MD   Brief Narrative:  David [name] is a 72 y.o. male with medical history significant of CKD, HTN, GERD, HFpEF (concentric hypertrophy, diastolic parameters normal on latest echo; previously 42 - 45%), CAD, DM2 who presents for SOB.  Mr. Viernes was recently admitted from 7/9 - 7/24 for acute appendicitis s/p surgery.  He had sepsis, ATN which improved with fluids. PT recommended SNF placement.  He was in [place] until 8/2.  He was not happy with his care there and so checked himself out yesterday.  He reports that on discharge from the hospital, his lasix was held, which is confirmed in the discharge summary, due to renal dysfunction. Since then pt reports having sob and he ws admitted for acute respiratory failure with hypoxia.     Assessment & Plan:   Active Problems:   Hypertension   Chronic back pain   Diabetic polyneuropathy associated with type 2 diabetes mellitus ([place])   Insulin-requiring or dependent type II diabetes mellitus ([place])   Acute hypoxemic respiratory failure (HCC)   Acute on chronic congestive heart failure (HCC)   Acute on chronic heart failure (HCC)   Acute respiratory failure with hypoxia secondary to acute on chronic CHF. Last echo showed normal LVEF and normal diastolic function.  Repeat echocardiogram this admission.  Pt reports his cardiologist asked to hold the lasix as his creatinine is worsening.  He initially required BIPAP,  transitioned to [place] oxygen and he is off the oxygen today.  Started him on IV lasix 20 mg BID. diuresed about 4 lit since admission. His creatinine stable around 1.7 today with IV lasix.  Continue with strict intake and output.  Daily weights.  Monitor renal parameters while on IV lasix.  Replete potassium as needed.     CAD: NO CHEST PAIN.  Resume aspirin and plavix.     Insulin requiring DM: CBG (last 3)  Recent  Labs    12/20/17 2219 12/21/17 0620 12/21/17 1131  GLUCAP 97 108* 164*  resume SSI and levemir.  No change in meds.    Hypertension: well controlled.   Stage 3 CKD: Creatinine is 1.7 at baseline.   Anemia of chronic disease : Hemoglobin at 8.3.  Anemia panel will be ordered.   GERD stable.    Chronic back pain: Controlled.     DVT prophylaxis: lovenox.  Code Status:  Full code.  Family Communication: (none at bedside.  Disposition Plan:  Pending further clinical improvement. Possible d/c tomorrow home .  Consultants:   None.   Procedures: None.  Antimicrobials:none.   Subjective: No chest pain or sob. Breathing has improved. No nausea, or vomiting.   Objective: Vitals:   12/21/17 0024 12/21/17 0426 12/21/17 0831 12/21/17 0834  BP: (!) 141/73 139/76 (!) 148/66 (!) 148/66  Pulse: 61 (!) 59  71  Resp: 18 16  16   Temp: 98.2 F (36.8 C) 98.8 F (37.1 C)  98.7 F (37.1 C)  TempSrc: Oral Oral  Oral  SpO2: 100% 100%  96%  Weight:      Height:        Intake/Output Summary (Last 24 hours) at 12/21/2017 1416 Last data filed at 12/21/2017 1119 Gross per 24 hour  Intake 863 ml  Output 1600 ml  Net -737 ml   Filed Weights   12/19/17 0840 12/19/17 1245  Weight: 106.1 kg (234 lb) 105.6 kg (232 lb 12.8 oz)  Examination:  General exam: Appears calm and comfortable  Transitioned off oxygen.  Respiratory system: Clear to auscultation. Respiratory effort normal. No wheezing or rhonchi.  Cardiovascular system: S1 & S2 heard, RRR.Marland Kitchen  Gastrointestinal system: Abdomen is soft non tender non distended bowel sounds good.  Central nervous system: Alert and oriented.non focal.  Extremities: Symmetric 5 x 5 power.pedal edema present. Right calf with chronic stasis ulcer.  Psychiatry:  Mood & affect appropriate.     Data Reviewed: I have personally reviewed following labs and imaging studies  CBC: Recent Labs  Lab 12/19/17 0835  WBC 9.7  HGB 8.3*  HCT 27.1*    MCV 108.4*  PLT 485   Basic Metabolic Panel: Recent Labs  Lab 12/19/17 0835 12/20/17 0312 12/21/17 1244  NA 143 142 139  K 3.8 3.9 4.4  CL 109 107 101  CO2 23 27 28   GLUCOSE 97 147* 180*  BUN 28* 29* 25*  CREATININE 1.82* 1.77* 1.71*  CALCIUM 8.6* 8.2* 8.5*   GFR: Estimated Creatinine Clearance: 52.1 mL/min (A) (by C-G formula based on SCr of 1.71 mg/dL (H)). Liver Function Tests: No results for input(s): AST, ALT, ALKPHOS, BILITOT, PROT, ALBUMIN in the last 168 hours. No results for input(s): LIPASE, AMYLASE in the last 168 hours. No results for input(s): AMMONIA in the last 168 hours. Coagulation Profile: No results for input(s): INR, PROTIME in the last 168 hours. Cardiac Enzymes: No results for input(s): CKTOTAL, CKMB, CKMBINDEX, TROPONINI in the last 168 hours. BNP (last 3 results) No results for input(s): PROBNP in the last 8760 hours. HbA1C: Recent Labs    12/19/17 1343  HGBA1C 6.3*   CBG: Recent Labs  Lab 12/20/17 1219 12/20/17 1623 12/20/17 2219 12/21/17 0620 12/21/17 1131  GLUCAP 142* 173* 97 108* 164*   Lipid Profile: No results for input(s): CHOL, HDL, LDLCALC, TRIG, CHOLHDL, LDLDIRECT in the last 72 hours. Thyroid Function Tests: Recent Labs    12/19/17 1343  TSH 1.906   Anemia Panel: No results for input(s): VITAMINB12, FOLATE, FERRITIN, TIBC, IRON, RETICCTPCT in the last 72 hours. Sepsis Labs: No results for input(s): PROCALCITON, LATICACIDVEN in the last 168 hours.  No results found for this or any previous visit (from the past 240 hour(s)).       Radiology Studies: Dg Chest Port 1 View  Result Date: 12/20/2017 CLINICAL DATA:  Acute on chronic heart failure EXAM: PORTABLE CHEST 1 VIEW COMPARISON:  12/19/2017 FINDINGS: Cardiac shadow is stable. Mild vascular congestion remains although improved when compare with the prior study. No focal confluent infiltrate is seen. No bony abnormality is noted. IMPRESSION: Improving vascular  congestion when compared with the prior study. Electronically Signed   By: Inez Catalina M.D.   On: 12/20/2017 07:57        Scheduled Meds: . amLODipine  5 mg Oral Daily  . aspirin EC  81 mg Oral Daily  . atorvastatin  40 mg Oral QHS  . clopidogrel  75 mg Oral Daily  . dorzolamide-timolol  1 drop Both Eyes BID  . enoxaparin (LOVENOX) injection  40 mg Subcutaneous Q24H  . fluticasone  1 spray Each Nare Daily  . furosemide  20 mg Intravenous Q12H  . gabapentin  600 mg Oral BID  . insulin aspart  0-20 Units Subcutaneous TID WC  . insulin aspart  0-5 Units Subcutaneous QHS  . insulin aspart  4 Units Subcutaneous TID WC  . insulin detemir  20 Units Subcutaneous Daily  . isosorbide-hydrALAZINE  1 tablet  Oral TID  . metoprolol succinate  100 mg Oral Daily  . mupirocin cream   Topical Daily  . sodium chloride flush  3 mL Intravenous Q12H   Continuous Infusions: . sodium chloride       LOS: 2 days    Time spent: 35 minutes.     Hosie Poisson, MD [facility] Pager [phone]  If 7PM-7AM, please contact night-coverage [email or web address] Password [email or web address] 12/21/2017, 2:16 PM

## 2017-12-21 NOTE — Progress Notes (Signed)
PT ambulated 450 feet. O2 sat never dropped below 93% on room air. Pt had no complaints. Pt will ambulate x1 this evening for night shift for follow up. Jerald Kief

## 2017-12-22 ENCOUNTER — Observation Stay (HOSPITAL_COMMUNITY): Payer: Medicare Other

## 2017-12-22 DIAGNOSIS — I5032 Chronic diastolic (congestive) heart failure: Secondary | ICD-10-CM

## 2017-12-22 DIAGNOSIS — Z794 Long term (current) use of insulin: Secondary | ICD-10-CM | POA: Diagnosis not present

## 2017-12-22 DIAGNOSIS — I5033 Acute on chronic diastolic (congestive) heart failure: Secondary | ICD-10-CM

## 2017-12-22 DIAGNOSIS — I10 Essential (primary) hypertension: Secondary | ICD-10-CM | POA: Diagnosis not present

## 2017-12-22 DIAGNOSIS — E119 Type 2 diabetes mellitus without complications: Secondary | ICD-10-CM | POA: Diagnosis not present

## 2017-12-22 LAB — ECHOCARDIOGRAM COMPLETE
HEIGHTINCHES: 74 in
WEIGHTICAEL: 3728 [oz_av]

## 2017-12-22 LAB — GLUCOSE, CAPILLARY
GLUCOSE-CAPILLARY: 150 mg/dL — AB (ref 70–99)
Glucose-Capillary: 110 mg/dL — ABNORMAL HIGH (ref 70–99)

## 2017-12-22 NOTE — Progress Notes (Signed)
RT offered pt BIPAP for the night. Pt declined BIPAP for the night. RT informed pt that if he needs it at a later time it is ordered. Last documentation of pt wearing BIPAP was on 12/19/17 @ 0835. RT will continue to monitor.

## 2017-12-22 NOTE — Care Management Obs Status (Signed)
[facility] NOTIFICATION   Patient Details  Name: KIMBALL APPLEBY MRN: [id number] Date of Birth: 04/11/1946   Medicare Observation Status Notification Given:  Yes    Carles Collet, RN 12/22/2017, 10:58 AM

## 2017-12-22 NOTE — Progress Notes (Signed)
12/22/2017 1533 Pt given AVS, reviewed discharge instructions and meds. All questions answered. D/Ced IV and Tele. Pt brought down via wheelchair with staff in no acute distress.

## 2017-12-22 NOTE — Evaluation (Signed)
Physical Therapy Evaluation Patient Details Name: David [name] MRN: [id number] DOB: Aug 08, 1945 Today's Date: 12/22/2017   History of Present Illness  David [name] is a 72 y.o. male with medical history significant of CKD, HTN, GERD, HFpEF (concentric hypertrophy, diastolic parameters normal on latest echo; previously 72 - 45%), CAD, DM2 who presents for SOB.  David [name] was recently admitted from 7/9 - 7/24 for acute appendicitis s/p surgery.  He had sepsis, ATN which improved with fluids. PT recommended SNF placement.  He was in [place] until yesterday.  He was not happy with his care there and so checked himself out yesterday.  He reports that on discharge from the hospital, his lasix was held, which is confirmed in the discharge summary, due to renal dysfunction.  Starting Tuesday, he developed SOB and required oxygen.  He felt like he was getting more and more volume overloaded and requested his lasix, but was not given it.  He went to see one of his doctors on Monday who was going to order lasix for every 2-3 days or PRN, but this order was not completed. He also has chronic back pain and he was not started on his chronic hydrocodone which led to some worsening of his back pain.  All of these issues led to his [facility].  Since being admitted there, he has had progressive LE swelling, SOB and feeling like his lungs were "filling with water."   He denies chest pain, fever, chills, recent illness, nausea, vomiting, diarrhea, constipation. He has a wound on his right shin which has not healed well.  These tend to come on when he is volume overloaded with has edema in his legs.     Clinical Impression  Pt admitted with above. Pt functioning a supervision level with exception of min guard on the stairs. Pt safe to return home with spouse but remains at increased falls risk and deconditioned. Would benefit from outpt PT to address these deficits and transition off the RW back to ambulation  without AD. Acute PT to cont to follow.    Follow Up Recommendations Outpatient PT;Supervision/Assistance - 24 hour    Equipment Recommendations  3in1 (PT)    Recommendations for Other Services       Precautions / Restrictions Precautions Precautions: Fall Restrictions Weight Bearing Restrictions: No      Mobility  Bed Mobility               General bed mobility comments: pt received standing at sink  Transfers Overall transfer level: Needs assistance Equipment used: Rolling walker (2 wheeled) Transfers: Sit to/from Stand Sit to Stand: Supervision         General transfer comment: v/c's to reach back to control descent to EOB, pt with good technique pushing up from bed, even from low surface heigth  Ambulation/Gait Ambulation/Gait assistance: Supervision Gait Distance (Feet): 200 Feet Assistive device: Rolling walker (2 wheeled) Gait Pattern/deviations: Step-through pattern;Trunk flexed Gait velocity: slow Gait velocity interpretation: 1.31 - 2.62 ft/sec, indicative of limited community ambulator General Gait Details: pt with increased trunk flexion due to previous back surgeries, pt able to achieve trunk extension but not maintain it. P  Stairs Stairs: Yes Stairs assistance: Min guard Stair Management: One rail Left;Step to pattern;Sideways Number of Stairs: 4 General stair comments: mimic home set up  Wheelchair Mobility    Modified Rankin (Stroke Patients Only)       Balance Overall balance assessment: Needs assistance Sitting-balance support: No upper extremity supported Sitting  balance-Leahy Scale: Good Sitting balance - Comments: pt able to don sneakers and tie them without difficulty   Standing balance support: Single extremity supported Standing balance-Leahy Scale: Fair Standing balance comment: pt standing at sink brushing teeth upon arrival, pt supporting self with L UE while using R UE to brush teeth                              Pertinent Vitals/Pain Pain Assessment: 0-10 Pain Score: 6  Pain Location: all over/every where Pain Descriptors / Indicators: Dull Pain Intervention(s): Monitored during session    Home Living Family/patient expects to be discharged to:: Private residence Living Arrangements: Spouse/significant other Available Help at Discharge: Family;Available PRN/intermittently(wife works during the day) Type of Home: House Home Access: Stairs to enter Entrance Stairs-Rails: Right;Left;Can reach both Technical brewer of Steps: 3 Home Layout: Two level Home Equipment: Environmental consultant - 2 wheels;Cane - quad;Other (comment) Additional Comments: was at [facility] until ED visit    Prior Function Level of Independence: Independent with assistive device(s)         Comments: used cane before surgery now using RW     Hand Dominance   Dominant Hand: Right    Extremity/Trunk Assessment   Upper Extremity Assessment Upper Extremity Assessment: Generalized weakness(bilat hand diabetic neuropathy)    Lower Extremity Assessment Lower Extremity Assessment: Generalized weakness(bilat foot diabetic neuropathy)    Cervical / Trunk Assessment Cervical / Trunk Assessment: (has had multiple back surgeries)  Communication   Communication: No difficulties  Cognition Arousal/Alertness: Awake/alert Behavior During Therapy: WFL for tasks assessed/performed Overall Cognitive Status: Within Functional Limits for tasks assessed                                        General Comments General comments (skin integrity, edema, etc.): VSS    Exercises     Assessment/Plan    PT Assessment Patient needs continued PT services  PT Problem List Decreased strength;Decreased balance;Decreased mobility;Decreased coordination;Decreased cognition;Decreased knowledge of use of DME;Decreased safety awareness;Pain       PT Treatment Interventions DME instruction;Gait training;Stair  training;Functional mobility training;Therapeutic activities;Balance training;Therapeutic exercise;Patient/family education;Cognitive remediation    PT Goals (Current goals can be found in the Care Plan section)  Acute Rehab PT Goals Patient Stated Goal: go ome today PT Goal Formulation: With patient Time For Goal Achievement: 01/05/18 Potential to Achieve Goals: Good    Frequency Min 3X/week   Barriers to discharge        Co-evaluation               AM-PAC PT "6 Clicks" Daily Activity  Outcome Measure Difficulty turning over in bed (including adjusting bedclothes, sheets and blankets)?: None Difficulty moving from lying on back to sitting on the side of the bed? : None Difficulty sitting down on and standing up from a chair with arms (e.g., wheelchair, bedside commode, etc,.)?: None Help needed moving to and from a bed to chair (including a wheelchair)?: None Help needed walking in hospital room?: None Help needed climbing 3-5 steps with a railing? : A Little 6 Click Score: 23    End of Session Equipment Utilized During Treatment: Gait belt Activity Tolerance: Patient tolerated treatment well Patient left: in bed;with call bell/phone within reach(sitting EOB) Nurse Communication: Mobility status PT Visit Diagnosis: Unsteadiness on feet (R26.81)    Time:  0959-1028 PT Time Calculation (min) (ACUTE ONLY): 29 min   Charges:   PT Evaluation $PT Eval Low Complexity: 1 Low PT Treatments $Gait Training: 8-22 mins        Kittie Plater, PT, DPT Pager #: [phone] Office #: [phone]   [place] 12/22/2017, 12:38 PM

## 2017-12-22 NOTE — Discharge Summary (Signed)
Physician Discharge Summary  David [name]:[id number] DOB: 05-13-1946 DOA: 12/19/2017  PCP: [name], MD  Admit date: 12/19/2017 Discharge date: 12/22/2017  Admitted From: Home. Disposition:  Home.   Recommendations for Outpatient Follow-up:  1. Follow up with PCP in 1-2 weeks 2. Please obtain BMP/CBC in one week Please follow up with Dr [name] by the end of the week.   Discharge Condition:stable.  CODE STATUS: full code.  Diet recommendation: Heart Healthy   Brief/Interim Summary: David [name] Lanieris a 72 y.o.malewith medical history significant ofCKD, HTN, GERD, HFpEF (concentric hypertrophy, diastolic parameters normal on latest echo; previously 40 - 45%), CAD, DM2 who presents for SOB. David [name] was recently admitted from 7/9 - 7/24 for acute appendicitis s/p surgery. He had sepsis, ATN which improved with fluids. PT recommended SNF placement. He was in [place] until 8/2. He was not happy with his care there and so checked himself out yesterday. He reports that on discharge from the hospital, his lasix was held, which is confirmed in the discharge summary, due to renal dysfunction. Since then pt reports having sob and he ws admitted for acute respiratory failure with hypoxia.     Discharge Diagnoses:  Active Problems:   Hypertension   Chronic back pain   Diabetic polyneuropathy associated with type 2 diabetes mellitus ([place])   Insulin-requiring or dependent type II diabetes mellitus ([place])   Acute hypoxemic respiratory failure (HCC)   Acute on chronic congestive heart failure (HCC)   Acute on chronic heart failure (HCC)   CHF (congestive heart failure) (HCC)  Acute respiratory failure with hypoxia secondary to acute on chronic CHF. Last echo showed normal LVEF and normal diastolic function.  Repeat echocardiogram shows grade 2 diastolic dysfunction and mod pulm hypertension.  He initially required BIPAP,  transitioned to DISH oxygen and he is off the oxygen  today.  Started him on IV lasix 20 mg BID. diuresed about 4 lit since admission. His creatinine stable around 1.7 , . He reports his breathing is better, we were able to wean him off the oxygen .  recommend to Continue with strict intake and output.  Daily weights.  Recommend to continue home dose of lasix and follow up with cardiology in less than a week.      CAD: NO CHEST PAIN.  Resume aspirin and plavix.     Insulin requiring DM: CBG (last 3)  RecentLabs(last2labs)  Recent Labs    12/20/17 2219 12/21/17 0620 12/21/17 1131  GLUCAP 97 108* 164*    resume SSI and levemir.  No change in meds.    Hypertension: well controlled.   Stage 3 CKD: Creatinine is 1.7 at baseline and stable.   Anemia of chronic disease : Hemoglobin at 8.3.  Pt refused blood draw this morning for evaluation of anemia.  Recommend outpatient follow up with anemia panel.   GERD stable.    Chronic back pain: Controlled.       Discharge Instructions  Discharge Instructions    (HEART FAILURE PATIENTS) Call MD:  Anytime you have any of the following symptoms: 1) 3 pound weight gain in 24 hours or 5 pounds in 1 week 2) shortness of breath, with or without a dry hacking cough 3) swelling in the hands, feet or stomach 4) if you have to sleep on extra pillows at night in order to breathe.   Complete by:  As directed    Ambulatory referral to Physical Therapy   Complete by:  As directed  Diet - low sodium heart healthy   Complete by:  As directed    Discharge instructions   Complete by:  As directed    Please follow up with Dr [name] by the end of the week.     Allergies as of 12/22/2017   No Known Allergies     Medication List    TAKE these medications   acetaminophen 325 MG tablet Commonly known as:  TYLENOL Take 2 tablets (650 mg total) by mouth every 6 (six) hours as needed for mild pain or moderate pain (or Fever >/= 101).   albuterol 108 (90 Base) MCG/ACT  inhaler Commonly known as:  PROVENTIL HFA;VENTOLIN HFA Inhale 2 puffs into the lungs every 6 (six) hours as needed for wheezing or shortness of breath.   ALPRAZolam 1 MG tablet Commonly known as:  XANAX Take 1 tablet (1 mg total) by mouth 2 (two) times daily as needed for anxiety.   amLODipine 10 MG tablet Commonly known as:  NORVASC Take 1 tablet (10 mg total) by mouth daily. What changed:  how much to take   aspirin EC 81 MG tablet Take 81 mg by mouth daily.   atorvastatin 40 MG tablet Commonly known as:  LIPITOR Take 40 mg by mouth at bedtime.   clopidogrel 75 MG tablet Commonly known as:  PLAVIX Take 1 tablet (75 mg total) by mouth daily.   dorzolamide-timolol 22.3-6.8 MG/ML ophthalmic solution Commonly known as:  COSOPT Place 1 drop into both eyes 2 (two) times daily.   fluticasone 50 MCG/ACT nasal spray Commonly known as:  FLONASE Place 1 spray into both nostrils daily.   furosemide 20 MG tablet Commonly known as:  LASIX Take 20 mg by mouth daily.   gabapentin 600 MG tablet Commonly known as:  NEURONTIN Take 600 mg by mouth 2 (two) times daily.   HUMALOG KWIKPEN 100 UNIT/ML KiwkPen Generic drug:  insulin lispro Inject 4 Units into the skin 3 (three) times daily.   HYDROcodone-acetaminophen 10-325 MG tablet Commonly known as:  NORCO Take 1 tablet by mouth every 6 (six) hours as needed for moderate pain.   Insulin Detemir 100 UNIT/ML Pen Commonly known as:  LEVEMIR FLEXTOUCH Inject 20 Units into the skin daily.   isosorbide-hydrALAZINE 20-37.5 MG tablet Commonly known as:  BIDIL Take 1 tablet by mouth 3 (three) times daily.   methocarbamol 750 MG tablet Commonly known as:  ROBAXIN Take 1 tablet (750 mg total) by mouth every 6 (six) hours as needed for muscle spasms.   metoprolol succinate 100 MG 24 hr tablet Commonly known as:  TOPROL-XL Take 1 tablet (100 mg total) by mouth daily. Take with or immediately following a meal.   saccharomyces boulardii  250 MG capsule Commonly known as:  FLORASTOR Take 1 capsule (250 mg total) by mouth 2 (two) times daily.   traMADol 50 MG tablet Commonly known as:  ULTRAM Take 1 tablet (50 mg total) by mouth every 6 (six) hours as needed for severe pain.      Follow-up Information    [name], MD. Schedule an appointment as soon as possible for a visit in 1 week(s).   Specialty:  Internal Medicine Contact information: [address] [place] [address] [phone]        Adrian Prows, MD. Schedule an appointment as soon as possible for a visit in 1 week(s).   Specialty:  Cardiology Contact information: [address] [place] [address] [phone]  No Known Allergies  Consultations:  None.    Procedures/Studies: Ct Abdomen Pelvis Wo Contrast  Result Date: 12/04/2017 CLINICAL DATA:  Perforated appendicitis.  Persistent pain. EXAM: CT ABDOMEN AND PELVIS WITHOUT CONTRAST TECHNIQUE: Multidetector CT imaging of the abdomen and pelvis was performed following the standard protocol without IV contrast. COMPARISON:  11/24/2017 FINDINGS: Lower chest: Atherosclerotic calcifications are noted involving the LAD, left circumflex and RCA coronary arteries. Hepatobiliary: There is no focal liver abnormality. The gallbladder appears normal. No biliary dilatation. Pancreas: Unremarkable. No pancreatic ductal dilatation or surrounding inflammatory changes. Spleen: Normal in size without focal abnormality. Adrenals/Urinary Tract: The adrenal glands are normal. No kidney mass or hydronephrosis identified. Urinary bladder is unremarkable. Stomach/Bowel: The stomach appears nondistended. The proximal and mid small bowel loops are unremarkable. Again noted are extensive inflammatory changes involving the right lower quadrant small bowel loops. Changes include large and small bowel wall edema with diffuse surrounding fat stranding. Evaluation of this area is significantly  diminished due to lack of both IV and oral contrast material. A right lower quadrant percutaneous drainage catheter is identified which terminates in the central pelvis. There is a calcification within the central pelvis measuring 7 mm which in the setting of recent ruptured appendicitis may represent an appendicolith. No large fluid collections identified. Vascular/Lymphatic: No significant vascular findings are present. No enlarged abdominal or pelvic lymph nodes. Reproductive: Prostate is unremarkable. Other: Diffuse fat stranding and mesenteric edema is identified. Small amount of free fluid noted within the dependent portion of the pelvis. Musculoskeletal: Degenerative disc disease noted within the lumbar spine. Previous right hip arthroplasty. IMPRESSION: 1. Evaluation of right lower quadrant inflammatory changes is significantly diminished due to lack of IV and oral contrast material. Consider further evaluation with contrast enhanced CT of the abdomen and pelvis following oral contrast administration to assess patency of bowel, rule out abscess, and evaluate for fistula. 2. Again noted are extensive inflammatory changes within the right lower quadrant of the abdomen. As mentioned on previous studies findings may represent the sequelae of ruptured appendicitis. Differential considerations include inflammatory/infectious enteritis. 3. Calcification in the right side of pelvis is noted which in the setting of ruptured appendicitis may represent and appendicolith Electronically Signed   By: Kerby Moors M.D.   On: 12/04/2017 11:48   Ct Abdomen Pelvis Wo Contrast  Result Date: 11/24/2017 CLINICAL DATA:  Abdominal and periumbilical pain. No bowel movement for 1 week. EXAM: CT ABDOMEN AND PELVIS WITHOUT CONTRAST TECHNIQUE: Multidetector CT imaging of the abdomen and pelvis was performed following the standard protocol without IV contrast. COMPARISON:  February 13, 2015 FINDINGS: Lower chest: No acute  abnormality. Hepatobiliary: No focal liver abnormality is seen. No gallstones, gallbladder wall thickening, or biliary dilatation. Pancreas: Unremarkable. No pancreatic ductal dilatation or surrounding inflammatory changes. Spleen: Normal in size without focal abnormality. Adrenals/Urinary Tract: Adrenal glands are unremarkable. Kidneys are normal, without renal calculi, focal lesion, or hydronephrosis. Bladder is unremarkable. Stomach/Bowel: The stomach and small bowel are normal. The appendix is markedly abnormal with a distal appendicolith and distal dilatation measuring up to 2 cm. No extraluminal gas is noted. It would be difficult to confidently exclude a small extraluminal fluid collection but none is definitively seen. There is wall thickening and periappendiceal stranding. There is wall thickening in the cecum near the takeoff of the appendix which may be secondary to the markedly inflamed appendix. The remainder of the colon is unremarkable. Vascular/Lymphatic: Minimal atherosclerosis is seen in the nonaneurysmal aorta. No adenopathy. Reproductive: Prostate  is unremarkable. Other: No abdominal wall hernia or abnormality. No abdominopelvic ascites. Musculoskeletal: Status post right hip replacement. Degenerative changes in the visualized spine. IMPRESSION: 1. Appendicitis with marked dilatation, wall thickening, and periappendiceal stranding. There is an appendicolith. Evaluation for a small extraluminal fluid collection would be limited without contrast but none are seen. Appendix: Location: Extends from the right lower quadrant of the abdomen into the right side of the pelvis. Abuts the sigmoid colon. Diameter: 2 cm Appendicolith: There is a single appendicoliths. Mucosal hyper-enhancement: Not assessed without contrast. Extraluminal gas: None identified. Periappendiceal collection: Difficult to evaluate without contrast but none seen. 2. Minimal atherosclerosis in the nonaneurysmal aorta. Electronically  Signed   By: Dorise Bullion III M.D   On: 11/24/2017 13:18   US Renal  Result Date: 11/27/2017 CLINICAL DATA:  Acute kidney injury. EXAM: RENAL / URINARY TRACT ULTRASOUND COMPLETE COMPARISON:  CT abdomen and pelvis 11/24/2017. FINDINGS: Right Kidney: Length: 12.3 cm. Echogenicity within normal limits. No mass or hydronephrosis visualized. Left Kidney: Length: 12.3 cm. Echogenicity within normal limits. No mass or hydronephrosis visualized. Bladder: Appears normal for degree of bladder distention. IMPRESSION: Negative for hydronephrosis.  Negative exam. Electronically Signed   By: Inge Rise M.D.   On: 11/27/2017 09:57   Dg Chest Port 1 View  Result Date: 12/20/2017 CLINICAL DATA:  Acute on chronic heart failure EXAM: PORTABLE CHEST 1 VIEW COMPARISON:  12/19/2017 FINDINGS: Cardiac shadow is stable. Mild vascular congestion remains although improved when compare with the prior study. No focal confluent infiltrate is seen. No bony abnormality is noted. IMPRESSION: Improving vascular congestion when compared with the prior study. Electronically Signed   By: Inez Catalina M.D.   On: 12/20/2017 07:57   Dg Chest Port 1 View  Result Date: 12/19/2017 CLINICAL DATA:  Shortness of breath EXAM: PORTABLE CHEST 1 VIEW COMPARISON:  11/24/2017 FINDINGS: Cardiac shadow is enlarged but stable. Increased central vascular congestion is noted as well as some atelectatic changes. No bony abnormality is seen. IMPRESSION: Changes of mild CHF without definitive edema. Mild atelectatic changes are noted. Electronically Signed   By: Inez Catalina M.D.   On: 12/19/2017 09:09   Dg Chest Port 1 View  Result Date: 11/24/2017 CLINICAL DATA:  Preop cardiovascular exam. EXAM: PORTABLE CHEST 1 VIEW COMPARISON:  03/30/2017 FINDINGS: Cardiomegaly with aortic atherosclerosis. Coronary arterial stent projects over the left heart. Lungs are clear without pulmonary consolidation, overt pulmonary edema, effusion or pneumothorax. Lower  cervical degenerative disc and facet arthropathy. No acute osseous abnormality. IMPRESSION: Cardiomegaly with aortic atherosclerosis. No active pulmonary disease. Electronically Signed   By: Ashley Royalty M.D.   On: 11/24/2017 18:20    Echocardiogram shows grade 2 diastolic dysfunction and moderate pulmonary  Hypertension.    Subjective: No chest pain or sob.   Discharge Exam: Vitals:   12/22/17 0455 12/22/17 1043  BP: (!) 155/86 (!) 154/57  Pulse:  65  Resp:    Temp: 98.6 F (37 C)   SpO2:     Vitals:   12/21/17 2120 12/21/17 2352 12/22/17 0455 12/22/17 1043  BP: 139/83 (!) 147/76 (!) 155/86 (!) 154/57  Pulse: 64   65  Resp: 11     Temp: 98.2 F (36.8 C) 99.8 F (37.7 C) 98.6 F (37 C)   TempSrc: Oral Oral Oral   SpO2: 99%     Weight:   105.7 kg (233 lb)   Height:        General: Pt is alert, awake, not in  acute distress Cardiovascular: RRR, S1/S2 +, no rubs, no gallops Respiratory: CTA bilaterally, no wheezing, no rhonchi Abdominal: Soft, NT, ND, bowel sounds + Extremities: no edema, no cyanosis    The results of significant diagnostics from this hospitalization (including imaging, microbiology, ancillary and laboratory) are listed below for reference.     Microbiology: No results found for this or any previous visit (from the past 240 hour(s)).   Labs: BNP (last 3 results) Recent Labs    03/29/17 0102 12/19/17 0902  BNP 119.9* 161.0*   Basic Metabolic Panel: Recent Labs  Lab 12/19/17 0835 12/20/17 0312 12/21/17 1244  NA 143 142 139  K 3.8 3.9 4.4  CL 109 107 101  CO2 23 27 28   GLUCOSE 97 147* 180*  BUN 28* 29* 25*  CREATININE 1.82* 1.77* 1.71*  CALCIUM 8.6* 8.2* 8.5*   Liver Function Tests: No results for input(s): AST, ALT, ALKPHOS, BILITOT, PROT, ALBUMIN in the last 168 hours. No results for input(s): LIPASE, AMYLASE in the last 168 hours. No results for input(s): AMMONIA in the last 168 hours. CBC: Recent Labs  Lab 12/19/17 0835  WBC  9.7  HGB 8.3*  HCT 27.1*  MCV 108.4*  PLT 244   Cardiac Enzymes: No results for input(s): CKTOTAL, CKMB, CKMBINDEX, TROPONINI in the last 168 hours. BNP: Invalid input(s): POCBNP CBG: Recent Labs  Lab 12/21/17 1131 12/21/17 1645 12/21/17 2204 12/22/17 0612 12/22/17 1109  GLUCAP 164* 66* 105* 110* 150*   D-Dimer No results for input(s): DDIMER in the last 72 hours. Hgb A1c No results for input(s): HGBA1C in the last 72 hours. Lipid Profile No results for input(s): CHOL, HDL, LDLCALC, TRIG, CHOLHDL, LDLDIRECT in the last 72 hours. Thyroid function studies No results for input(s): TSH, T4TOTAL, T3FREE, THYROIDAB in the last 72 hours.  Invalid input(s): FREET3 Anemia work up No results for input(s): VITAMINB12, FOLATE, FERRITIN, TIBC, IRON, RETICCTPCT in the last 72 hours. Urinalysis    Component Value Date/Time   COLORURINE AMBER (A) 11/27/2017 1313   APPEARANCEUR CLOUDY (A) 11/27/2017 1313   LABSPEC 1.021 11/27/2017 1313   PHURINE 5.0 11/27/2017 1313   GLUCOSEU NEGATIVE 11/27/2017 1313   HGBUR MODERATE (A) 11/27/2017 1313   BILIRUBINUR NEGATIVE 11/27/2017 1313   KETONESUR NEGATIVE 11/27/2017 1313   PROTEINUR 100 (A) 11/27/2017 1313   UROBILINOGEN 1.0 02/13/2015 1934   NITRITE NEGATIVE 11/27/2017 1313   LEUKOCYTESUR NEGATIVE 11/27/2017 1313   Sepsis Labs Invalid input(s): PROCALCITONIN,  WBC,  LACTICIDVEN Microbiology No results found for this or any previous visit (from the past 240 hour(s)).   Time coordinating discharge: 35  minutes  SIGNED:   Hosie Poisson, MD  [facility] 12/22/2017, 6:38 PM Pager   If 7PM-7AM, please contact night-coverage [email or web address] Password [email or web address]

## 2017-12-22 NOTE — Care Management CC44 (Signed)
Condition Code 44 Documentation Completed  Patient Details  Name: SHAQUELLE HERNON MRN: [id number] Date of Birth: 28-May-1945   Condition Code 44 given:  Yes Patient signature on Condition Code 44 notice:  Yes Documentation of 2 MD's agreement:  Yes Code 44 added to claim:  Yes    Carles Collet, RN 12/22/2017, 10:58 AM

## 2017-12-22 NOTE — Care Management Important Message (Signed)
Important Message  Patient Details  Name: David [name] MRN: [id number] Date of Birth: 1945-09-03   Medicare Important Message Given:  Yes    Sana Tessmer P Knobel 12/22/2017, 3:24 PM

## 2017-12-22 NOTE — Progress Notes (Signed)
  Echocardiogram 2D Echocardiogram has been performed.  12/22/2017, 2:50 PM

## 2017-12-23 ENCOUNTER — Ambulatory Visit (INDEPENDENT_AMBULATORY_CARE_PROVIDER_SITE_OTHER): Payer: Medicare Other | Admitting: Family

## 2017-12-23 ENCOUNTER — Encounter (INDEPENDENT_AMBULATORY_CARE_PROVIDER_SITE_OTHER): Payer: Self-pay | Admitting: Family

## 2017-12-23 DIAGNOSIS — L97511 Non-pressure chronic ulcer of other part of right foot limited to breakdown of skin: Secondary | ICD-10-CM | POA: Diagnosis not present

## 2017-12-23 NOTE — Progress Notes (Signed)
Office Visit Note   Patient: David [name]           Date of Birth: July 22, 1945           MRN: [id number] Visit Date: 12/23/2017              Requested by: [name], MD [address] [place], [place] [address] PCP: [name], MD  No chief complaint on file.     HPI: The patient is a 72 year old gentleman who is seen today for bilateral foot evaluation.  Is well-known to our clinic.  Has a history of right great toe amputation.  States while in the hospital his left foot was scraped against a wall has ulceration to the tip of his left great toe.  Has been doing Bactroban dressing changes daily to this.  To the right foot has callus beneath the metatarsal head of the great toe.  The great toe is surgically absent.  Recent hospitalization for heart failure.  Assessment & Plan: Visit Diagnoses:  1. Right foot ulcer, limited to breakdown of skin ([place])     Plan: Continue daily Dial soap cleansing and Bactroban dressing changes to both foot ulcers.  Place a doughnut for pressure relief on the right.  Nonweightbearing on the right is much as possible.  Follow-up in the office in 2 weeks.  Follow-Up Instructions: Return in about 2 weeks (around 01/06/2018).   Ortho Exam  Patient is alert, oriented, no adenopathy, well-dressed, normal affect, normal respiratory effort. Callus ulceration beneath the first metatarsal head this was part of the 10 blade knife back to viable tissue there is ulceration this is 1 cm in diameter and 1 mm deep there is no drainage today.  There is some surrounding ecchymosis and discoloration.  There is no odor no erythema no sign of infection.  Does have thickened and onychomycotic nails x9 these were trimmed today without incident.  Patient is unable to safely trim his own toenails.  The left foot has a 1 cm in diameter 1 mm thick callus beneath the fifth metatarsal head this is also part of the 10 blade knife.  There is superficial  ulceration to the tip of his great toe on the left this is fully granulating there is no sign of infection.  Imaging: No results found. No images are attached to the encounter.  Labs: Lab Results  Component Value Date   HGBA1C 6.3 (H) 12/19/2017   HGBA1C 7.5 (H) 11/24/2017   HGBA1C 7.2 (H) 04/01/2017   ESRSEDRATE 73 (H) 02/07/2015   ESRSEDRATE 22 (H) 09/15/2013   CRP 8.2 (H) 02/07/2015   REPTSTATUS 11/29/2017 FINAL 11/28/2017   CULT  11/28/2017    NO GROWTH Performed at [facility] [address] [place], [place] [address]      Lab Results  Component Value Date   ALBUMIN 2.3 (L) 12/09/2017   ALBUMIN 2.7 (L) 11/24/2017   ALBUMIN 3.4 (L) 09/03/2016    There is no height or weight on file to calculate BMI.  Orders:  No orders of the defined types were placed in this encounter.  No orders of the defined types were placed in this encounter.    Procedures: No procedures performed  Clinical Data: No additional findings.  ROS:  All other systems negative, except as noted in the HPI. Review of Systems  Constitutional: Negative for chills and fever.  Cardiovascular: Positive for leg swelling.  Skin: Positive for wound. Negative for  color change.    Objective: Vital Signs: There were no vitals taken for this visit.  Specialty Comments:  No specialty comments available.  PMFS History: Patient Active Problem List   Diagnosis Date Noted  . CHF (congestive heart failure) ([place]) 12/22/2017  . Acute on chronic heart failure ([place]) 12/19/2017  . At risk for adverse drug reaction 12/15/2017  . Chronic pain syndrome 12/15/2017  . Appendicitis 11/24/2017  . Acute on chronic congestive heart failure ([place])   . Status post coronary artery stent placement   . Acute renal failure superimposed on stage 3 chronic kidney disease ([place])   . Pneumonia 03/30/2017  . Insulin-requiring or dependent type II diabetes mellitus ([place]) 03/29/2017  . Acute hypoxemic respiratory  failure ([place]) 03/29/2017  . Acute respiratory failure with hypoxia ([place])   . JVD (jugular venous distension)   . Pulmonary congestion   . Acquired contracture of Achilles tendon, right 08/19/2016  . Amputated great toe, right ([place]) 07/18/2016  . Onychomycosis 05/29/2016  . Diabetic polyneuropathy associated with type 2 diabetes mellitus ([place]) 04/09/2016  . Right foot ulcer, limited to breakdown of skin ([place]) 04/09/2016  . Sepsis ([place]) 02/06/2015  . Unspecified hereditary and idiopathic peripheral neuropathy 09/15/2013  . Malaise 08/14/2013  . Intractable nausea and vomiting 08/14/2013  . Acute gastroenteritis 08/14/2013  . Hypertension 08/14/2013  . Chronic back pain 08/14/2013  . Glaucoma 08/14/2013  . Headache 08/14/2013  . Hypertensive urgency 08/14/2013   Past Medical History:  Diagnosis Date  . Arthritis   . CAD in native artery    s/p stent in 11/18  . CHF (congestive heart failure) ([place])    normal echo in 11/18  . CKD (chronic kidney disease)   . DDD (degenerative disc disease), cervical   . Diabetes mellitus with complication (HCC)    Type II  . Diabetic neuropathy ([place])   . Diabetic retinopathy ([place])   . GERD (gastroesophageal reflux disease)   . Glaucoma   . Hypertension   . Renal disorder    Stage II    Family History  Problem Relation Age of Onset  . Diabetes Other   . Hyperlipidemia Other   . Hypertension Other   . Stroke Other   . Alzheimer's disease Other   . Thyroid disease Mother   . Diabetes Mellitus II Father     Past Surgical History:  Procedure Laterality Date  . AMPUTATION Right 07/09/2016   Procedure: Right Great Toe Amputation at Metatarsophalangeal Joint;  Surgeon: [name], MD;  Location: [facility];  Service: Orthopedics;  Laterality: Right;  . BACK SURGERY     4  . CARDIAC CATHETERIZATION    . CORONARY STENT INTERVENTION N/A 04/06/2017   Procedure: CORONARY STENT INTERVENTION;  Surgeon: Nigel Mormon, MD;  Location: [facility];  Service: Cardiovascular;  Laterality: N/A;  . CORONARY STENT INTERVENTION N/A 04/07/2017   Procedure: CORONARY STENT INTERVENTION;  Surgeon: Nigel Mormon, MD;  Location: [facility];  Service: Cardiovascular;  Laterality: N/A;  . CYST EXCISION     on Back  . JOINT REPLACEMENT    . LAPAROSCOPIC ABDOMINAL EXPLORATION N/A 11/26/2017   Procedure: LAPAROSCOPIC ABDOMINAL EXPLORATION, DRAINAGE OF APPENDICEAL ABCESS. PLACEMENT OF DRAIN;  Surgeon: Alphonsa Overall, MD;  Location: [facility];  Service: General;  Laterality: N/A;  . RIGHT/LEFT HEART CATH AND CORONARY ANGIOGRAPHY N/A 04/06/2017   Procedure: RIGHT/LEFT HEART CATH AND CORONARY ANGIOGRAPHY;  Surgeon: Nigel Mormon, MD;  Location: [facility]  [facility];  Service: Cardiovascular;  Laterality: N/A;  . TOTAL HIP ARTHROPLASTY     Right    Social History   Occupational History  . Occupation: Retired  Tobacco Use  . Smoking status: Never Smoker  . Smokeless tobacco: Never Used  Substance and Sexual Activity  . Alcohol use: Not Currently  . Drug use: No  . Sexual activity: Yes

## 2018-01-06 ENCOUNTER — Encounter (INDEPENDENT_AMBULATORY_CARE_PROVIDER_SITE_OTHER): Payer: Self-pay | Admitting: Family

## 2018-01-06 ENCOUNTER — Ambulatory Visit (INDEPENDENT_AMBULATORY_CARE_PROVIDER_SITE_OTHER): Payer: Medicare Other | Admitting: Family

## 2018-01-06 VITALS — Ht 74.0 in | Wt 233.0 lb

## 2018-01-06 DIAGNOSIS — L97511 Non-pressure chronic ulcer of other part of right foot limited to breakdown of skin: Secondary | ICD-10-CM

## 2018-01-06 DIAGNOSIS — S98111A Complete traumatic amputation of right great toe, initial encounter: Secondary | ICD-10-CM

## 2018-01-06 MED ORDER — SULFAMETHOXAZOLE-TRIMETHOPRIM 800-160 MG PO TABS: 1.0000 | ORAL_TABLET | Freq: Two times a day (BID) | ORAL | 0 refills | Status: DC

## 2018-01-06 MED ORDER — SULFAMETHOXAZOLE-TRIMETHOPRIM 800-160 MG PO TABS
1.0000 | ORAL_TABLET | Freq: Two times a day (BID) | ORAL | 0 refills | Status: DC
Start: 2018-01-06 — End: 2018-05-05

## 2018-01-06 NOTE — Progress Notes (Signed)
Office Visit Note   Patient: David [name]           Date of Birth: 1945/07/26           MRN: [id number] Visit Date: 01/06/2018              Requested by: [name], MD [address] [place], [place] [address] PCP: [name], MD  Chief Complaint  Patient presents with  . Left Foot - Wound Check  . Right Foot - Wound Check      HPI: The patient is a 72 year old gentleman who is seen today for right foot evaluation.  Is well-known to our clinic.  Has a history of right great toe amputation.  Today is predominantly concern for ulceration beneath the first metatarsal head on the right.  The right great toe is surgically absent.  States has had green drainage from his ulceration.  Most recently hospitalized for appendicitis just before that hospitalized for heart failure.  Assessment & Plan: Visit Diagnoses:  1. Right foot ulcer, limited to breakdown of skin ([place])   2. Amputated great toe, right (HCC)     Plan: Continue daily Dial soap cleansing and Bactroban dressing changes to both foot ulcers.  Place a doughnut for pressure relief on the right.  Nonweightbearing on the right is much as possible.  Follow-up in the office in 1 week.  Follow-Up Instructions: Return in about 1 week (around 01/13/2018).   Ortho Exam  Patient is alert, oriented, no adenopathy, well-dressed, normal affect, normal respiratory effort. Callus ulceration beneath the first metatarsal head this was part of the 10 blade knife back to viable tissue there is ulceration this is 1 cm in diameter and 2 mm deep there is no active drainage today.  Drainage seen on dressing that is bright green.  Surrounding callus with ischemic changes, this was debrided, underlying epithelialization.  There is no odor no erythema. No cellulitis..  .  Imaging: No results found. No images are attached to the encounter.  Labs: Lab Results  Component Value Date   HGBA1C 6.3 (H) 12/19/2017   HGBA1C 7.5  (H) 11/24/2017   HGBA1C 7.2 (H) 04/01/2017   ESRSEDRATE 73 (H) 02/07/2015   ESRSEDRATE 22 (H) 09/15/2013   CRP 8.2 (H) 02/07/2015   REPTSTATUS 11/29/2017 FINAL 11/28/2017   CULT  11/28/2017    NO GROWTH Performed at [facility] [address], [place], [place] [address]      Lab Results  Component Value Date   ALBUMIN 2.3 (L) 12/09/2017   ALBUMIN 2.7 (L) 11/24/2017   ALBUMIN 3.4 (L) 09/03/2016    Body mass index is 29.92 kg/m.  Orders:  No orders of the defined types were placed in this encounter.  No orders of the defined types were placed in this encounter.    Procedures: No procedures performed  Clinical Data: No additional findings.  ROS:  All other systems negative, except as noted in the HPI. Review of Systems  Constitutional: Negative for chills and fever.  Cardiovascular: Positive for leg swelling.  Skin: Positive for wound. Negative for color change.    Objective: Vital Signs: Ht 6\' 2"  (1.88 m)   Wt 233 lb (105.7 kg)   BMI 29.92 kg/m   Specialty Comments:  No specialty comments available.  PMFS History: Patient Active Problem List   Diagnosis Date Noted  . CHF (congestive heart failure) ([place]) 12/22/2017  . Acute on chronic heart failure ([place])  12/19/2017  . At risk for adverse drug reaction 12/15/2017  . Chronic pain syndrome 12/15/2017  . Appendicitis 11/24/2017  . Acute on chronic congestive heart failure ([place])   . Status post coronary artery stent placement   . Acute renal failure superimposed on stage 3 chronic kidney disease ([place])   . Pneumonia 03/30/2017  . Insulin-requiring or dependent type II diabetes mellitus ([place]) 03/29/2017  . Acute hypoxemic respiratory failure ([place]) 03/29/2017  . Acute respiratory failure with hypoxia ([place])   . JVD (jugular venous distension)   . Pulmonary congestion   . Acquired contracture of Achilles tendon, right 08/19/2016  . Amputated great toe, right ([place]) 07/18/2016  . Onychomycosis  05/29/2016  . Diabetic polyneuropathy associated with type 2 diabetes mellitus ([place]) 04/09/2016  . Right foot ulcer, limited to breakdown of skin ([place]) 04/09/2016  . Sepsis ([place]) 02/06/2015  . Unspecified hereditary and idiopathic peripheral neuropathy 09/15/2013  . Malaise 08/14/2013  . Intractable nausea and vomiting 08/14/2013  . Acute gastroenteritis 08/14/2013  . Hypertension 08/14/2013  . Chronic back pain 08/14/2013  . Glaucoma 08/14/2013  . Headache 08/14/2013  . Hypertensive urgency 08/14/2013   Past Medical History:  Diagnosis Date  . Arthritis   . CAD in native artery    s/p stent in 11/18  . CHF (congestive heart failure) ([place])    normal echo in 11/18  . CKD (chronic kidney disease)   . DDD (degenerative disc disease), cervical   . Diabetes mellitus with complication (HCC)    Type II  . Diabetic neuropathy ([place])   . Diabetic retinopathy ([place])   . GERD (gastroesophageal reflux disease)   . Glaucoma   . Hypertension   . Renal disorder    Stage II    Family History  Problem Relation Age of Onset  . Diabetes Other   . Hyperlipidemia Other   . Hypertension Other   . Stroke Other   . Alzheimer's disease Other   . Thyroid disease Mother   . Diabetes Mellitus II Father     Past Surgical History:  Procedure Laterality Date  . AMPUTATION Right 07/09/2016   Procedure: Right Great Toe Amputation at Metatarsophalangeal Joint;  Surgeon: [name], MD;  Location: [facility];  Service: Orthopedics;  Laterality: Right;  . BACK SURGERY     4  . CARDIAC CATHETERIZATION    . CORONARY STENT INTERVENTION N/A 04/06/2017   Procedure: CORONARY STENT INTERVENTION;  Surgeon: Nigel Mormon, MD;  Location: [facility];  Service: Cardiovascular;  Laterality: N/A;  . CORONARY STENT INTERVENTION N/A 04/07/2017   Procedure: CORONARY STENT INTERVENTION;  Surgeon: Nigel Mormon, MD;  Location: [facility];  Service: Cardiovascular;  Laterality: N/A;  . CYST  EXCISION     on Back  . JOINT REPLACEMENT    . LAPAROSCOPIC ABDOMINAL EXPLORATION N/A 11/26/2017   Procedure: LAPAROSCOPIC ABDOMINAL EXPLORATION, DRAINAGE OF APPENDICEAL ABCESS. PLACEMENT OF DRAIN;  Surgeon: Alphonsa Overall, MD;  Location: [facility];  Service: General;  Laterality: N/A;  . RIGHT/LEFT HEART CATH AND CORONARY ANGIOGRAPHY N/A 04/06/2017   Procedure: RIGHT/LEFT HEART CATH AND CORONARY ANGIOGRAPHY;  Surgeon: Nigel Mormon, MD;  Location: [facility];  Service: Cardiovascular;  Laterality: N/A;  . TOTAL HIP ARTHROPLASTY     Right    Social History   Occupational History  . Occupation: Retired  Tobacco Use  . Smoking status: Never Smoker  . Smokeless tobacco: Never Used  Substance and Sexual Activity  .  Alcohol use: Not Currently  . Drug use: No  . Sexual activity: Yes

## 2018-01-06 NOTE — Addendum Note (Signed)
Addended by: Suzan Slick on: 01/06/2018 03:36 PM   Modules accepted: Orders

## 2018-01-12 ENCOUNTER — Other Ambulatory Visit (HOSPITAL_BASED_OUTPATIENT_CLINIC_OR_DEPARTMENT_OTHER): Payer: Self-pay

## 2018-01-12 DIAGNOSIS — R0683 Snoring: Secondary | ICD-10-CM

## 2018-01-12 DIAGNOSIS — G4710 Hypersomnia, unspecified: Secondary | ICD-10-CM

## 2018-01-12 DIAGNOSIS — G4730 Sleep apnea, unspecified: Secondary | ICD-10-CM

## 2018-01-14 ENCOUNTER — Ambulatory Visit (INDEPENDENT_AMBULATORY_CARE_PROVIDER_SITE_OTHER): Payer: Medicare Other | Admitting: Orthopedic Surgery

## 2018-01-14 ENCOUNTER — Encounter (INDEPENDENT_AMBULATORY_CARE_PROVIDER_SITE_OTHER): Payer: Self-pay | Admitting: Orthopedic Surgery

## 2018-01-14 DIAGNOSIS — L97511 Non-pressure chronic ulcer of other part of right foot limited to breakdown of skin: Secondary | ICD-10-CM

## 2018-01-14 DIAGNOSIS — M6701 Short Achilles tendon (acquired), right ankle: Secondary | ICD-10-CM

## 2018-01-14 DIAGNOSIS — Z89412 Acquired absence of left great toe: Secondary | ICD-10-CM | POA: Diagnosis not present

## 2018-01-14 DIAGNOSIS — S98111A Complete traumatic amputation of right great toe, initial encounter: Secondary | ICD-10-CM

## 2018-01-14 DIAGNOSIS — E1142 Type 2 diabetes mellitus with diabetic polyneuropathy: Secondary | ICD-10-CM | POA: Diagnosis not present

## 2018-01-14 NOTE — Progress Notes (Signed)
Office Visit Note   Patient: David [name]           Date of Birth: Sep 22, 1945           MRN: [id number] Visit Date: 01/14/2018              Requested by: [name], MD [address] [place], [place] [address] PCP: [name], MD  Chief Complaint  Patient presents with  . Right Foot - Pain, Wound Check      HPI: Patient is a 72 year old gentleman who presents in follow-up for both lower extremities he has had traumatic venous ulcers on both legs which he states have healed with his medical compression stockings.  Patient recently has had debridement of ulcer beneath the first metatarsal head of the right foot he presents complaining of a new ulcer beneath the fifth metatarsal head of the right foot.  He states he fell 2 days ago.  Is currently on Bactrim DS.  Patient's states that he was recently hospitalized for congestive heart failure he had appendicitis underwent exploratory laparoscopy but his appendix was not identified.  Assessment & Plan: Visit Diagnoses:  1. Right foot ulcer, limited to breakdown of skin ([place])   2. Amputated great toe, right ([place])   3. Acquired contracture of Achilles tendon, right   4. Diabetic polyneuropathy associated with type 2 diabetes mellitus ([place])     Plan: The ulcer was debrided of skin and soft tissue Band-Aids were applied a felt relieving pad was placed under his orthotic to relieve pressure from the first and fifth metatarsal head.  Reevaluate venous ulcers and plantar ulcers at follow-up.  Follow-Up Instructions: Return in about 4 weeks (around 02/11/2018).   Ortho Exam  Patient is alert, oriented, no adenopathy, well-dressed, normal affect, normal respiratory effort. Examination patient has recurrent heel cord contracture he states he has not been doing his Achilles stretching he has dorsiflexion just short of neutral.  He has some superficial venous traumatic ulcers on the right leg which appear to be healing  these are less than 10 mm in diameter.  Examination of the right foot he has a Waggoner grade 1 ulcer beneath the first metatarsal head this is 5 mm in diameter 1 mm deep with healthy granulation tissue there is no exposed bone or tendon.  He has a new ulcer beneath the fifth metatarsal head of the right foot.  After informed consent a 10 blade knife was used to debride the skin and soft tissue back to healthy viable granulation tissue this was touched with silver nitrate a Band-Aid was applied.  The ulcer is 15 mm in diameter 1 mm deep.  Imaging: No results found. No images are attached to the encounter.  Labs: Lab Results  Component Value Date   HGBA1C 6.3 (H) 12/19/2017   HGBA1C 7.5 (H) 11/24/2017   HGBA1C 7.2 (H) 04/01/2017   ESRSEDRATE 73 (H) 02/07/2015   ESRSEDRATE 22 (H) 09/15/2013   CRP 8.2 (H) 02/07/2015   REPTSTATUS 11/29/2017 FINAL 11/28/2017   CULT  11/28/2017    NO GROWTH Performed at [facility] [address] [place],  [address]      Lab Results  Component Value Date   ALBUMIN 2.3 (L) 12/09/2017   ALBUMIN 2.7 (L) 11/24/2017   ALBUMIN 3.4 (L) 09/03/2016    There is no height or weight on file to calculate BMI.  Orders:  No orders of the defined types were placed in this  encounter.  No orders of the defined types were placed in this encounter.    Procedures: No procedures performed  Clinical Data: No additional findings.  ROS:  All other systems negative, except as noted in the HPI. Review of Systems  Objective: Vital Signs: There were no vitals taken for this visit.  Specialty Comments:  No specialty comments available.  PMFS History: Patient Active Problem List   Diagnosis Date Noted  . CHF (congestive heart failure) ([place]) 12/22/2017  . Acute on chronic heart failure ([place]) 12/19/2017  . At risk for adverse drug reaction 12/15/2017  . Chronic pain syndrome 12/15/2017  . Appendicitis 11/24/2017  . Acute on chronic congestive  heart failure ([place])   . Status post coronary artery stent placement   . Acute renal failure superimposed on stage 3 chronic kidney disease ([place])   . Pneumonia 03/30/2017  . Insulin-requiring or dependent type II diabetes mellitus ([place]) 03/29/2017  . Acute hypoxemic respiratory failure ([place]) 03/29/2017  . Acute respiratory failure with hypoxia ([place])   . JVD (jugular venous distension)   . Pulmonary congestion   . Acquired contracture of Achilles tendon, right 08/19/2016  . Amputated great toe, right ([place]) 07/18/2016  . Onychomycosis 05/29/2016  . Diabetic polyneuropathy associated with type 2 diabetes mellitus ([place]) 04/09/2016  . Right foot ulcer, limited to breakdown of skin ([place]) 04/09/2016  . Sepsis ([place]) 02/06/2015  . Unspecified hereditary and idiopathic peripheral neuropathy 09/15/2013  . Malaise 08/14/2013  . Intractable nausea and vomiting 08/14/2013  . Acute gastroenteritis 08/14/2013  . Hypertension 08/14/2013  . Chronic back pain 08/14/2013  . Glaucoma 08/14/2013  . Headache 08/14/2013  . Hypertensive urgency 08/14/2013   Past Medical History:  Diagnosis Date  . Arthritis   . CAD in native artery    s/p stent in 11/18  . CHF (congestive heart failure) ([place])    normal echo in 11/18  . CKD (chronic kidney disease)   . DDD (degenerative disc disease), cervical   . Diabetes mellitus with complication (HCC)    Type II  . Diabetic neuropathy ([place])   . Diabetic retinopathy ([place])   . GERD (gastroesophageal reflux disease)   . Glaucoma   . Hypertension   . Renal disorder    Stage II    Family History  Problem Relation Age of Onset  . Diabetes Other   . Hyperlipidemia Other   . Hypertension Other   . Stroke Other   . Alzheimer's disease Other   . Thyroid disease Mother   . Diabetes Mellitus II Father     Past Surgical History:  Procedure Laterality Date  . AMPUTATION Right 07/09/2016   Procedure: Right Great Toe Amputation at Metatarsophalangeal Joint;   Surgeon: [name], MD;  Location: [facility];  Service: Orthopedics;  Laterality: Right;  . BACK SURGERY     4  . CARDIAC CATHETERIZATION    . CORONARY STENT INTERVENTION N/A 04/06/2017   Procedure: CORONARY STENT INTERVENTION;  Surgeon: Nigel Mormon, MD;  Location: [facility];  Service: Cardiovascular;  Laterality: N/A;  . CORONARY STENT INTERVENTION N/A 04/07/2017   Procedure: CORONARY STENT INTERVENTION;  Surgeon: Nigel Mormon, MD;  Location: [facility];  Service: Cardiovascular;  Laterality: N/A;  . CYST EXCISION     on Back  . JOINT REPLACEMENT    . LAPAROSCOPIC ABDOMINAL EXPLORATION N/A 11/26/2017   Procedure: LAPAROSCOPIC ABDOMINAL EXPLORATION, DRAINAGE OF APPENDICEAL ABCESS. PLACEMENT OF DRAIN;  Surgeon: Alphonsa Overall, MD;  [facility];  Service: General;  Laterality: N/A;  . RIGHT/LEFT HEART CATH AND CORONARY ANGIOGRAPHY N/A 04/06/2017   Procedure: RIGHT/LEFT HEART CATH AND CORONARY ANGIOGRAPHY;  Surgeon: Nigel Mormon, MD;  Location: [facility];  Service: Cardiovascular;  Laterality: N/A;  . TOTAL HIP ARTHROPLASTY     Right    Social History   Occupational History  . Occupation: Retired  Tobacco Use  . Smoking status: Never Smoker  . Smokeless tobacco: Never Used  Substance and Sexual Activity  . Alcohol use: Not Currently  . Drug use: No  . Sexual activity: Yes

## 2018-01-15 ENCOUNTER — Ambulatory Visit
Admission: RE | Admit: 2018-01-15 | Discharge: 2018-01-15 | Disposition: A | Discharge status: Home / Self Care | Payer: Medicare Other | Source: Ambulatory Visit | Attending: Internal Medicine | Admitting: Internal Medicine

## 2018-01-15 ENCOUNTER — Other Ambulatory Visit: Payer: Self-pay | Admitting: Internal Medicine

## 2018-01-15 DIAGNOSIS — R0789 Other chest pain: Secondary | ICD-10-CM

## 2018-01-20 ENCOUNTER — Ambulatory Visit (INDEPENDENT_AMBULATORY_CARE_PROVIDER_SITE_OTHER): Payer: Medicare Other | Admitting: Family

## 2018-02-11 ENCOUNTER — Ambulatory Visit (HOSPITAL_BASED_OUTPATIENT_CLINIC_OR_DEPARTMENT_OTHER): Payer: Medicare Other | Attending: Internal Medicine | Admitting: Internal Medicine

## 2018-02-11 DIAGNOSIS — G4710 Hypersomnia, unspecified: Secondary | ICD-10-CM | POA: Diagnosis present

## 2018-02-11 DIAGNOSIS — R0683 Snoring: Secondary | ICD-10-CM | POA: Diagnosis not present

## 2018-02-11 DIAGNOSIS — G4730 Sleep apnea, unspecified: Secondary | ICD-10-CM | POA: Diagnosis not present

## 2018-02-16 ENCOUNTER — Encounter (INDEPENDENT_AMBULATORY_CARE_PROVIDER_SITE_OTHER): Payer: Self-pay | Admitting: Orthopedic Surgery

## 2018-02-16 ENCOUNTER — Ambulatory Visit (INDEPENDENT_AMBULATORY_CARE_PROVIDER_SITE_OTHER): Payer: Medicare Other | Admitting: Orthopedic Surgery

## 2018-02-16 VITALS — Ht 74.0 in | Wt 233.0 lb

## 2018-02-16 DIAGNOSIS — L97511 Non-pressure chronic ulcer of other part of right foot limited to breakdown of skin: Secondary | ICD-10-CM | POA: Diagnosis not present

## 2018-02-16 NOTE — Progress Notes (Signed)
Office Visit Note   Patient: David [name]           Date of Birth: 1946-04-27           MRN: [id number] Visit Date: 02/16/2018              Requested by: [name], MD [address] [place] [address] [place], [place] [address] PCP: [name], MD  Chief Complaint  Patient presents with  . Right Foot - Pain, Follow-up      HPI: Patient is a 72 year old gentleman with diabetic insensate neuropathy venous insufficiency currently wearing compression stockings.  Patient states that he did fall sustaining some abrasions to his legs with venous insufficiency.  Patient has occlusive OpSite dressings over these wounds.  Assessment & Plan: Visit Diagnoses:  1. Right foot ulcer, limited to breakdown of skin ([place])     Plan: Recommended patient use a large compression stocking his calf is 36 cm in circumference he is wearing the extra-large stocking which is not providing enough compression recommended using the compression sock directly against the wounds to promote healing decrease maceration.  Discussed the importance of probiotics do not feel he needs antibiotics at this time.  Follow-Up Instructions: Return in about 4 weeks (around 03/16/2018).   Ortho Exam  Patient is alert, oriented, no adenopathy, well-dressed, normal affect, normal respiratory effort. Examination patient has multiple traumatic venous ulcers of both legs his skin is very thin and atrophic there is multiple areas of maceration from the occlusive OpSite dressings.  Patient has a grade 1 ulcer beneath the right foot first metatarsal head.  After informed consent a 10 blade knife was used to debride the skin and soft tissue back to healthy viable granulation tissue the ulcer is 15 mm in diameter and 1 mm deep.  This does not probe to bone or tendon.  He has pitting edema in both legs but no cellulitis.  Imaging: No results found. No images are attached to the encounter.  Labs: Lab Results  Component Value  Date   HGBA1C 6.3 (H) 12/19/2017   HGBA1C 7.5 (H) 11/24/2017   HGBA1C 7.2 (H) 04/01/2017   ESRSEDRATE 73 (H) 02/07/2015   ESRSEDRATE 22 (H) 09/15/2013   CRP 8.2 (H) 02/07/2015   REPTSTATUS 11/29/2017 FINAL 11/28/2017   CULT  11/28/2017    NO GROWTH Performed at [facility] [address], [place],  [address]      Lab Results  Component Value Date   ALBUMIN 2.3 (L) 12/09/2017   ALBUMIN 2.7 (L) 11/24/2017   ALBUMIN 3.4 (L) 09/03/2016    Body mass index is 29.92 kg/m.  Orders:  No orders of the defined types were placed in this encounter.  No orders of the defined types were placed in this encounter.    Procedures: No procedures performed  Clinical Data: No additional findings.  ROS:  All other systems negative, except as noted in the HPI. Review of Systems  Objective: Vital Signs: Ht 6\' 2"  (1.88 m)   Wt 233 lb (105.7 kg)   BMI 29.92 kg/m   Specialty Comments:  No specialty comments available.  PMFS History: Patient Active Problem List   Diagnosis Date Noted  . Snoring 02/11/2018  . CHF (congestive heart failure) ([place]) 12/22/2017  . Acute on chronic heart failure ([place]) 12/19/2017  . At risk for adverse drug reaction 12/15/2017  . Chronic pain syndrome 12/15/2017  . Appendicitis 11/24/2017  . Acute on chronic congestive heart failure ([place])   .  Status post coronary artery stent placement   . Acute renal failure superimposed on stage 3 chronic kidney disease ([place])   . Pneumonia 03/30/2017  . Insulin-requiring or dependent type II diabetes mellitus ([place]) 03/29/2017  . Acute hypoxemic respiratory failure ([place]) 03/29/2017  . Acute respiratory failure with hypoxia ([place])   . JVD (jugular venous distension)   . Pulmonary congestion   . Acquired contracture of Achilles tendon, right 08/19/2016  . Amputated great toe, right ([place]) 07/18/2016  . Onychomycosis 05/29/2016  . Diabetic polyneuropathy associated with type 2 diabetes mellitus ([place])  04/09/2016  . Right foot ulcer, limited to breakdown of skin ([place]) 04/09/2016  . Sepsis ([place]) 02/06/2015  . Unspecified hereditary and idiopathic peripheral neuropathy 09/15/2013  . Malaise 08/14/2013  . Intractable nausea and vomiting 08/14/2013  . Acute gastroenteritis 08/14/2013  . Hypertension 08/14/2013  . Chronic back pain 08/14/2013  . Glaucoma 08/14/2013  . Headache 08/14/2013  . Hypertensive urgency 08/14/2013   Past Medical History:  Diagnosis Date  . Arthritis   . CAD in native artery    s/p stent in 11/18  . CHF (congestive heart failure) ([place])    normal echo in 11/18  . CKD (chronic kidney disease)   . DDD (degenerative disc disease), cervical   . Diabetes mellitus with complication (HCC)    Type II  . Diabetic neuropathy ([place])   . Diabetic retinopathy ([place])   . GERD (gastroesophageal reflux disease)   . Glaucoma   . Hypertension   . Renal disorder    Stage II    Family History  Problem Relation Age of Onset  . Diabetes Other   . Hyperlipidemia Other   . Hypertension Other   . Stroke Other   . Alzheimer's disease Other   . Thyroid disease Mother   . Diabetes Mellitus II Father     Past Surgical History:  Procedure Laterality Date  . AMPUTATION Right 07/09/2016   Procedure: Right Great Toe Amputation at Metatarsophalangeal Joint;  Surgeon: [name], MD;  Location: [facility];  Service: Orthopedics;  Laterality: Right;  . BACK SURGERY     4  . CARDIAC CATHETERIZATION    . CORONARY STENT INTERVENTION N/A 04/06/2017   Procedure: CORONARY STENT INTERVENTION;  Surgeon: Nigel Mormon, MD;  Location: [facility];  Service: Cardiovascular;  Laterality: N/A;  . CORONARY STENT INTERVENTION N/A 04/07/2017   Procedure: CORONARY STENT INTERVENTION;  Surgeon: Nigel Mormon, MD;  Location: [facility];  Service: Cardiovascular;  Laterality: N/A;  . CYST EXCISION     on Back  . JOINT REPLACEMENT    . LAPAROSCOPIC ABDOMINAL EXPLORATION N/A  11/26/2017   Procedure: LAPAROSCOPIC ABDOMINAL EXPLORATION, DRAINAGE OF APPENDICEAL ABCESS. PLACEMENT OF DRAIN;  Surgeon: Alphonsa Overall, MD;  Location: [facility];  Service: General;  Laterality: N/A;  . RIGHT/LEFT HEART CATH AND CORONARY ANGIOGRAPHY N/A 04/06/2017   Procedure: RIGHT/LEFT HEART CATH AND CORONARY ANGIOGRAPHY;  Surgeon: Nigel Mormon, MD;  Location: [facility];  Service: Cardiovascular;  Laterality: N/A;  . TOTAL HIP ARTHROPLASTY     Right    Social History   Occupational History  . Occupation: Retired  Tobacco Use  . Smoking status: Never Smoker  . Smokeless tobacco: Never Used  Substance and Sexual Activity  . Alcohol use: Not Currently  . Drug use: No  . Sexual activity: Yes

## 2018-02-20 NOTE — Procedures (Signed)
   NAME: David [name] DATE OF BIRTH:  February 28, 1946 MEDICAL RECORD NUMBER [id number]  LOCATION: [facility]  PHYSICIAN: Marius Ditch  DATE OF STUDY: 02/11/2018  SLEEP STUDY TYPE: Nocturnal Polysomnogram               REFERRING PHYSICIAN: Marius Ditch, MD  INDICATION FOR STUDY: Persistent nocturnal hypoxemia  EPWORTH SLEEPINESS SCORE:  NA HEIGHT:    WEIGHT:      There is no height or weight on file to calculate BMI.  NECK SIZE: 15.5 in.  MEDICATIONS  Patient self administered medications include: N/A. Medications administered during study include Sleep medicine administered - xanax at 08:38:29 AM.   SLEEP STUDY TECHNIQUE  A multi-channel overnight Polysomnography study was performed. The channels recorded and monitored were central and occipital EEG, electrooculogram (EOG), submentalis EMG (chin), nasal and oral airflow, thoracic and abdominal wall motion, anterior tibialis EMG, snore microphone, electrocardiogram, and a pulse oximetry.   TECHNICAL COMMENTS  Comments added by Technician: PVC'S THROUGHOUT THE NIGHT Comments added by Scorer: N/A   SLEEP ARCHITECTURE  The study was initiated at 11:28:01 PM and terminated at 5:36:29 AM. The total recorded time was 368.5 minutes. EEG confirmed total sleep time was 333 minutes yielding a sleep efficiency of 90.4%%. Sleep onset after lights out was 6.2 minutes with a REM latency of 79.5 minutes. The patient spent 6.2%% of the night in stage N1 sleep, 82.0%% in stage N2 sleep, 0.0%% in stage N3 and 11.9% in REM. Wake after sleep onset (WASO) was 29.2 minutes. The Arousal Index was 5.8/hour.   RESPIRATORY PARAMETERS  There were a total of 12 respiratory disturbances out of which 1 were apneas ( 1 obstructive, 0 mixed, 0 central) and 11 hypopneas. The apnea/hypopnea index (AHI) was 2.2 events/hour. The central sleep apnea index was 0.0 events/hour. The REM AHI was 18.2 events/hour and NREM AHI was 0.0 events/hour.  The supine AHI was 2.2 events/hour and the non supine AHI was 0 events/hour. RDI was the same as AHI. Respiratory disturbances were associated with oxygen desaturation down to a nadir of 85.0% during sleep. The mean oxygen saturation during the study was 94.8%. The cumulative time under 88% oxygen saturation was 5.5 minutes.  LEG MOVEMENT DATA  The total leg movements were 55 with a resulting leg movement index of 9.9/hr . Associated arousal with leg movement index was 0.4/hr.   CARDIAC DATA  The underlying cardiac rhythm was most consistent with sinus rhythm. Mean heart rate during sleep was 60.0 bpm. Additional rhythm abnormalities include PVCs.   IMPRESSIONS  Sleep disordered breathing is present in REM with some oxygen sats down to 85%. Overall, there is no Significant Obstructive Sleep apnea(OSA) No Significant Central Sleep Apnea (CSA)  DIAGNOSIS  Nocturnal Hypoxemia (327.26 [G47.36 ICD-10])  RECOMMENDATIONS  There is no indication for treatment of sleep disordered breathing.   Marius Ditch Sleep specialist, [place] Board of Internal Medicine  ELECTRONICALLY SIGNED ON:  02/20/2018, 9:41 PM [place] PH: [phone]   FX: [phone] [place]

## 2018-03-23 ENCOUNTER — Ambulatory Visit (INDEPENDENT_AMBULATORY_CARE_PROVIDER_SITE_OTHER): Payer: Medicare Other | Admitting: Physician Assistant

## 2018-03-23 ENCOUNTER — Encounter (INDEPENDENT_AMBULATORY_CARE_PROVIDER_SITE_OTHER): Payer: Self-pay | Admitting: Orthopedic Surgery

## 2018-03-23 VITALS — Ht 74.0 in | Wt 233.0 lb

## 2018-03-23 DIAGNOSIS — Z89411 Acquired absence of right great toe: Secondary | ICD-10-CM | POA: Diagnosis not present

## 2018-03-23 DIAGNOSIS — M6701 Short Achilles tendon (acquired), right ankle: Secondary | ICD-10-CM | POA: Diagnosis not present

## 2018-03-23 DIAGNOSIS — B351 Tinea unguium: Secondary | ICD-10-CM

## 2018-03-23 DIAGNOSIS — L97511 Non-pressure chronic ulcer of other part of right foot limited to breakdown of skin: Secondary | ICD-10-CM | POA: Diagnosis not present

## 2018-03-23 DIAGNOSIS — E1142 Type 2 diabetes mellitus with diabetic polyneuropathy: Secondary | ICD-10-CM | POA: Diagnosis not present

## 2018-03-23 DIAGNOSIS — S98111A Complete traumatic amputation of right great toe, initial encounter: Secondary | ICD-10-CM

## 2018-03-24 ENCOUNTER — Encounter (INDEPENDENT_AMBULATORY_CARE_PROVIDER_SITE_OTHER): Payer: Self-pay | Admitting: Physician Assistant

## 2018-03-24 NOTE — Progress Notes (Signed)
Office Visit Note   Patient: David [name]           Date of Birth: 1946/02/19           MRN: [id number] Visit Date: 03/23/2018              Requested by: [name], MD [address] [place] [address] [place], [place] [address] PCP: [name], MD  Chief Complaint  Patient presents with  . Right Foot - Follow-up  . Left Foot - Follow-up      HPI: The patient is a 72 yo male with diabetic insensate neuropathy of bilateral lower extremities and venous insufficiency who is seen for right foot callus/ulcers. He is using silver compression socks and is very pleased with the results. He reports no pain or swelling issues and is walking with a cane without problems. He comes in today for trim of callus and some of his toe nails.   Assessment & Plan: Visit Diagnoses:  1. Right foot ulcer, limited to breakdown of skin ([place])   2. Amputated great toe, right ([place])   3. Acquired contracture of Achilles tendon, right   4. Diabetic polyneuropathy associated with type 2 diabetes mellitus ([place])   5. Onychomycosis     Plan: After informed consent the callus over the base of the 5th metatarsal and the 1st metatarsal were debrided of callus and skin and the patient tolerated the procedure well. Several nails trimmed.  Continue compression socks. follow up in 4 weeks.   Follow-Up Instructions: Return in about 4 weeks (around 04/20/2018).   Ortho Exam  Patient is alert, oriented, no adenopathy, well-dressed, normal affect, normal respiratory effort. The small traumatic ulcers of the anterior calves have healed except for a small ~ 1 cm diameter superficial area on the right proximal calf. No signs of cellulitis or infection in either leg.  Right 1st and 5th metatarsal plantar callus were debrided with a #10 blade knife after informed consent and the patient tolerated this well. Several nails trimmed.   Imaging: No results found. No images are attached to the encounter.  Labs: Lab  Results  Component Value Date   HGBA1C 6.3 (H) 12/19/2017   HGBA1C 7.5 (H) 11/24/2017   HGBA1C 7.2 (H) 04/01/2017   ESRSEDRATE 73 (H) 02/07/2015   ESRSEDRATE 22 (H) 09/15/2013   CRP 8.2 (H) 02/07/2015   REPTSTATUS 11/29/2017 FINAL 11/28/2017   CULT  11/28/2017    NO GROWTH Performed at [facility] [address], [place], [place] [address]      Lab Results  Component Value Date   ALBUMIN 2.3 (L) 12/09/2017   ALBUMIN 2.7 (L) 11/24/2017   ALBUMIN 3.4 (L) 09/03/2016    Body mass index is 29.92 kg/m.  Orders:  No orders of the defined types were placed in this encounter.  No orders of the defined types were placed in this encounter.    Procedures: No procedures performed  Clinical Data: No additional findings.  ROS:  All other systems negative, except as noted in the HPI. Review of Systems  Objective: Vital Signs: Ht 6\' 2"  (1.88 m)   Wt 233 lb (105.7 kg)   BMI 29.92 kg/m   Specialty Comments:  No specialty comments available.  PMFS History: Patient Active Problem List   Diagnosis Date Noted  . Snoring 02/11/2018  . CHF (congestive heart failure) ([place]) 12/22/2017  . Acute on chronic heart failure ([place]) 12/19/2017  . At risk for adverse drug reaction 12/15/2017  .  Chronic pain syndrome 12/15/2017  . Appendicitis 11/24/2017  . Acute on chronic congestive heart failure ([place])   . Status post coronary artery stent placement   . Acute renal failure superimposed on stage 3 chronic kidney disease ([place])   . Pneumonia 03/30/2017  . Insulin-requiring or dependent type II diabetes mellitus ([place]) 03/29/2017  . Acute hypoxemic respiratory failure ([place]) 03/29/2017  . Acute respiratory failure with hypoxia ([place])   . JVD (jugular venous distension)   . Pulmonary congestion   . Acquired contracture of Achilles tendon, right 08/19/2016  . Amputated great toe, right ([place]) 07/18/2016  . Onychomycosis 05/29/2016  . Diabetic polyneuropathy associated with type 2  diabetes mellitus ([place]) 04/09/2016  . Right foot ulcer, limited to breakdown of skin ([place]) 04/09/2016  . Sepsis ([place]) 02/06/2015  . Unspecified hereditary and idiopathic peripheral neuropathy 09/15/2013  . Malaise 08/14/2013  . Intractable nausea and vomiting 08/14/2013  . Acute gastroenteritis 08/14/2013  . Hypertension 08/14/2013  . Chronic back pain 08/14/2013  . Glaucoma 08/14/2013  . Headache 08/14/2013  . Hypertensive urgency 08/14/2013   Past Medical History:  Diagnosis Date  . Arthritis   . CAD in native artery    s/p stent in 11/18  . CHF (congestive heart failure) ([place])    normal echo in 11/18  . CKD (chronic kidney disease)   . DDD (degenerative disc disease), cervical   . Diabetes mellitus with complication (HCC)    Type II  . Diabetic neuropathy ([place])   . Diabetic retinopathy ([place])   . GERD (gastroesophageal reflux disease)   . Glaucoma   . Hypertension   . Renal disorder    Stage II    Family History  Problem Relation Age of Onset  . Diabetes Other   . Hyperlipidemia Other   . Hypertension Other   . Stroke Other   . Alzheimer's disease Other   . Thyroid disease Mother   . Diabetes Mellitus II Father     Past Surgical History:  Procedure Laterality Date  . AMPUTATION Right 07/09/2016   Procedure: Right Great Toe Amputation at Metatarsophalangeal Joint;  Surgeon: [name], MD;  Location: [facility];  Service: Orthopedics;  Laterality: Right;  . BACK SURGERY     4  . CARDIAC CATHETERIZATION    . CORONARY STENT INTERVENTION N/A 04/06/2017   Procedure: CORONARY STENT INTERVENTION;  Surgeon: Nigel Mormon, MD;  Location: [facility];  Service: Cardiovascular;  Laterality: N/A;  . CORONARY STENT INTERVENTION N/A 04/07/2017   Procedure: CORONARY STENT INTERVENTION;  Surgeon: Nigel Mormon, MD;  Location: [facility];  Service: Cardiovascular;  Laterality: N/A;  . CYST EXCISION     on Back  . JOINT REPLACEMENT    . LAPAROSCOPIC  ABDOMINAL EXPLORATION N/A 11/26/2017   Procedure: LAPAROSCOPIC ABDOMINAL EXPLORATION, DRAINAGE OF APPENDICEAL ABCESS. PLACEMENT OF DRAIN;  Surgeon: Alphonsa Overall, MD;  Location: [facility];  Service: General;  Laterality: N/A;  . RIGHT/LEFT HEART CATH AND CORONARY ANGIOGRAPHY N/A 04/06/2017   Procedure: RIGHT/LEFT HEART CATH AND CORONARY ANGIOGRAPHY;  Surgeon: Nigel Mormon, MD;  Location: [facility];  Service: Cardiovascular;  Laterality: N/A;  . TOTAL HIP ARTHROPLASTY     Right    Social History   Occupational History  . Occupation: Retired  Tobacco Use  . Smoking status: Never Smoker  . Smokeless tobacco: Never Used  Substance and Sexual Activity  . Alcohol use: Not Currently  . Drug use: No  . Sexual  activity: Yes

## 2018-04-21 ENCOUNTER — Ambulatory Visit (INDEPENDENT_AMBULATORY_CARE_PROVIDER_SITE_OTHER): Payer: Medicare Other | Admitting: Family

## 2018-04-21 ENCOUNTER — Encounter (INDEPENDENT_AMBULATORY_CARE_PROVIDER_SITE_OTHER): Payer: Self-pay | Admitting: Family

## 2018-04-21 VITALS — Ht 74.0 in | Wt 233.0 lb

## 2018-04-21 DIAGNOSIS — L97511 Non-pressure chronic ulcer of other part of right foot limited to breakdown of skin: Secondary | ICD-10-CM

## 2018-04-21 DIAGNOSIS — E1142 Type 2 diabetes mellitus with diabetic polyneuropathy: Secondary | ICD-10-CM

## 2018-04-21 DIAGNOSIS — M6701 Short Achilles tendon (acquired), right ankle: Secondary | ICD-10-CM

## 2018-04-21 NOTE — Progress Notes (Signed)
Office Visit Note   Patient: David [name]           Date of Birth: 24-Jul-1945           MRN: [id number] Visit Date: 04/21/2018              Requested by: [name], MD [address] [place] [address] [place], [place] [address] PCP: [name], MD  Chief Complaint  Patient presents with  . Right Foot - Follow-up      HPI: The patient is a 72 yo male with diabetic insensate neuropathy of bilateral lower extremities and venous insufficiency who is seen for right foot callus/ulcers. He is using silver compression socks. He reports no pain or swelling issues and is walking with a cane without problems. He comes in today for trim of callus.   Assessment & Plan: Visit Diagnoses:  1. Right foot ulcer, limited to breakdown of skin ([place])   2. Acquired contracture of Achilles tendon, right   3. Diabetic polyneuropathy associated with type 2 diabetes mellitus ([place])     Plan: After informed consent the callus over the base of the 1st metatarsal were debrided of callus and skin and the patient tolerated the procedure well. Daily wound care. Antibacterial ointment and dressing. minimize weignt bearing. Given darco shoe to offload forefoot. Continue compression socks. follow up in 4 weeks.   Follow-Up Instructions: Return in about 19 days (around 05/10/2018).   Ortho Exam  Patient is alert, oriented, no adenopathy, well-dressed, normal affect, normal respiratory effort. Right 1st metatarsal plantar callus were debrided with a #10 blade knife after informed consent and the patient tolerated this well. Central ulceration is 6 mm in diameter and 3 mm deep. Granulation in bed. No drainage. No odor. No surrounding erythema.   Imaging: No results found. No images are attached to the encounter.  Labs: Lab Results  Component Value Date   HGBA1C 6.3 (H) 12/19/2017   HGBA1C 7.5 (H) 11/24/2017   HGBA1C 7.2 (H) 04/01/2017   ESRSEDRATE 73 (H) 02/07/2015   ESRSEDRATE 22 (H) 09/15/2013   CRP 8.2 (H) 02/07/2015   REPTSTATUS 11/29/2017 FINAL 11/28/2017   CULT  11/28/2017    NO GROWTH Performed at [facility] [address] [place], [place] [address]      Lab Results  Component Value Date   ALBUMIN 2.3 (L) 12/09/2017   ALBUMIN 2.7 (L) 11/24/2017   ALBUMIN 3.4 (L) 09/03/2016    Body mass index is 29.92 kg/m.  Orders:  No orders of the defined types were placed in this encounter.  No orders of the defined types were placed in this encounter.    Procedures: No procedures performed  Clinical Data: No additional findings.  ROS:  All other systems negative, except as noted in the HPI. Review of Systems  Constitutional: Negative for chills and fever.  Cardiovascular: Negative for leg swelling.  Skin: Positive for wound. Negative for color change.    Objective: Vital Signs: Ht 6\' 2"  (1.88 m)   Wt 233 lb (105.7 kg)   BMI 29.92 kg/m   Specialty Comments:  No specialty comments available.  PMFS History: Patient Active Problem List   Diagnosis Date Noted  . Snoring 02/11/2018  . CHF (congestive heart failure) ([place]) 12/22/2017  . Acute on chronic heart failure ([place]) 12/19/2017  . At risk for adverse drug reaction 12/15/2017  . Chronic pain syndrome 12/15/2017  . Appendicitis 11/24/2017  . Acute on chronic congestive heart failure ([place])   .  Status post coronary artery stent placement   . Acute renal failure superimposed on stage 3 chronic kidney disease ([place])   . Pneumonia 03/30/2017  . Insulin-requiring or dependent type II diabetes mellitus ([place]) 03/29/2017  . Acute hypoxemic respiratory failure ([place]) 03/29/2017  . Acute respiratory failure with hypoxia ([place])   . JVD (jugular venous distension)   . Pulmonary congestion   . Acquired contracture of Achilles tendon, right 08/19/2016  . Amputated great toe, right ([place]) 07/18/2016  . Onychomycosis 05/29/2016  . Diabetic polyneuropathy associated with type 2 diabetes mellitus ([place]) 04/09/2016   . Right foot ulcer, limited to breakdown of skin ([place]) 04/09/2016  . Sepsis ([place]) 02/06/2015  . Unspecified hereditary and idiopathic peripheral neuropathy 09/15/2013  . Malaise 08/14/2013  . Intractable nausea and vomiting 08/14/2013  . Acute gastroenteritis 08/14/2013  . Hypertension 08/14/2013  . Chronic back pain 08/14/2013  . Glaucoma 08/14/2013  . Headache 08/14/2013  . Hypertensive urgency 08/14/2013   Past Medical History:  Diagnosis Date  . Arthritis   . CAD in native artery    s/p stent in 11/18  . CHF (congestive heart failure) ([place])    normal echo in 11/18  . CKD (chronic kidney disease)   . DDD (degenerative disc disease), cervical   . Diabetes mellitus with complication (HCC)    Type II  . Diabetic neuropathy ([place])   . Diabetic retinopathy ([place])   . GERD (gastroesophageal reflux disease)   . Glaucoma   . Hypertension   . Renal disorder    Stage II    Family History  Problem Relation Age of Onset  . Diabetes Other   . Hyperlipidemia Other   . Hypertension Other   . Stroke Other   . Alzheimer's disease Other   . Thyroid disease Mother   . Diabetes Mellitus II Father     Past Surgical History:  Procedure Laterality Date  . AMPUTATION Right 07/09/2016   Procedure: Right Great Toe Amputation at Metatarsophalangeal Joint;  Surgeon: [name], MD;  Location: [facility];  Service: Orthopedics;  Laterality: Right;  . BACK SURGERY     4  . CARDIAC CATHETERIZATION    . CORONARY STENT INTERVENTION N/A 04/06/2017   Procedure: CORONARY STENT INTERVENTION;  Surgeon: Nigel Mormon, MD;  Location: [facility];  Service: Cardiovascular;  Laterality: N/A;  . CORONARY STENT INTERVENTION N/A 04/07/2017   Procedure: CORONARY STENT INTERVENTION;  Surgeon: Nigel Mormon, MD;  Location: [facility];  Service: Cardiovascular;  Laterality: N/A;  . CYST EXCISION     on Back  . JOINT REPLACEMENT    . LAPAROSCOPIC ABDOMINAL EXPLORATION N/A 11/26/2017     Procedure: LAPAROSCOPIC ABDOMINAL EXPLORATION, DRAINAGE OF APPENDICEAL ABCESS. PLACEMENT OF DRAIN;  Surgeon: Alphonsa Overall, MD;  Location: [facility];  Service: General;  Laterality: N/A;  . RIGHT/LEFT HEART CATH AND CORONARY ANGIOGRAPHY N/A 04/06/2017   Procedure: RIGHT/LEFT HEART CATH AND CORONARY ANGIOGRAPHY;  Surgeon: Nigel Mormon, MD;  Location: [facility];  Service: Cardiovascular;  Laterality: N/A;  . TOTAL HIP ARTHROPLASTY     Right    Social History   Occupational History  . Occupation: Retired  Tobacco Use  . Smoking status: Never Smoker  . Smokeless tobacco: Never Used  Substance and Sexual Activity  . Alcohol use: Not Currently  . Drug use: No  . Sexual activity: Yes

## 2018-05-05 ENCOUNTER — Ambulatory Visit (INDEPENDENT_AMBULATORY_CARE_PROVIDER_SITE_OTHER): Payer: Medicare Other | Admitting: Family

## 2018-05-05 ENCOUNTER — Telehealth (INDEPENDENT_AMBULATORY_CARE_PROVIDER_SITE_OTHER): Payer: Self-pay | Admitting: Family

## 2018-05-05 ENCOUNTER — Ambulatory Visit (INDEPENDENT_AMBULATORY_CARE_PROVIDER_SITE_OTHER): Payer: Self-pay

## 2018-05-05 ENCOUNTER — Encounter (INDEPENDENT_AMBULATORY_CARE_PROVIDER_SITE_OTHER): Payer: Self-pay | Admitting: Family

## 2018-05-05 DIAGNOSIS — L97511 Non-pressure chronic ulcer of other part of right foot limited to breakdown of skin: Secondary | ICD-10-CM

## 2018-05-05 MED ORDER — DOXYCYCLINE HYCLATE 100 MG PO TABS: 100.0000 mg | ORAL_TABLET | Freq: Two times a day (BID) | ORAL | 0 refills | Status: DC

## 2018-05-05 NOTE — Progress Notes (Signed)
Office Visit Note   Patient: David [name]           Date of Birth: 1945-09-19           MRN: [id number] Visit Date: 05/05/2018              Requested by: [name], MD [address] [place] [address] [place], [place] [address] PCP: [name], MD  Chief Complaint  Patient presents with  . Right Foot - Wound Check      HPI: The patient is a 72 yo male with diabetic insensate neuropathy of bilateral lower extremities and venous insufficiency who is seen for right foot ulcers. He is using silver compression socks. He reports no pain issues and is walking with a cane. He comes in today for trim of callus. Wondering why the second toe is now swollen.   Assessment & Plan: Visit Diagnoses:  1. Right foot ulcer, limited to breakdown of skin ([place])     Plan: dorsal ulceration probes to bone on second toe. Discussed amputation of the second toe. Does want to proceed with that until after the holidays. Have provided Doxycycline prescription. Discussed strict return precautions. Daily wound care. Antibacterial ointment and dressing. minimize weignt bearing.  Follow-Up Instructions: No follow-ups on file.   Ortho Exam  Patient is alert, oriented, no adenopathy, well-dressed, normal affect, normal respiratory effort. Right 1st metatarsal plantar callus is 10 mm in diameter. Surrounding maceration. Is 1 mm deep. No drainage or erythema. The second toe has dorsal ulcer, this is 10 mm in diameter and probes to bone. There is sausage digit swelling to the second toe. No odor. No drainage.   Unable to safely trim own nails due to insensate neuropathy. Trimmed in office without incident x 9.  Imaging: No results found. No images are attached to the encounter.  Labs: Lab Results  Component Value Date   HGBA1C 6.3 (H) 12/19/2017   HGBA1C 7.5 (H) 11/24/2017   HGBA1C 7.2 (H) 04/01/2017   ESRSEDRATE 73 (H) 02/07/2015   ESRSEDRATE 22 (H) 09/15/2013   CRP 8.2 (H) 02/07/2015   REPTSTATUS 11/29/2017 FINAL 11/28/2017   CULT  11/28/2017    NO GROWTH Performed at [facility] [address] [place], [place] [address]      Lab Results  Component Value Date   ALBUMIN 2.3 (L) 12/09/2017   ALBUMIN 2.7 (L) 11/24/2017   ALBUMIN 3.4 (L) 09/03/2016    There is no height or weight on file to calculate BMI.  Orders:  Orders Placed This Encounter  Procedures  . XR Foot 2 Views Right   No orders of the defined types were placed in this encounter.    Procedures: No procedures performed  Clinical Data: No additional findings.  ROS:  All other systems negative, except as noted in the HPI. Review of Systems  Constitutional: Negative for chills and fever.  Cardiovascular: Negative for leg swelling.  Skin: Positive for wound. Negative for color change.    Objective: Vital Signs: There were no vitals taken for this visit.  Specialty Comments:  No specialty comments available.  PMFS History: Patient Active Problem List   Diagnosis Date Noted  . Snoring 02/11/2018  . CHF (congestive heart failure) ([place]) 12/22/2017  . Acute on chronic heart failure ([place]) 12/19/2017  . At risk for adverse drug reaction 12/15/2017  . Chronic pain syndrome 12/15/2017  . Appendicitis 11/24/2017  . Acute on chronic congestive heart failure ([place])   . Status  post coronary artery stent placement   . Acute renal failure superimposed on stage 3 chronic kidney disease ([place])   . Pneumonia 03/30/2017  . Insulin-requiring or dependent type II diabetes mellitus ([place]) 03/29/2017  . Acute hypoxemic respiratory failure ([place]) 03/29/2017  . Acute respiratory failure with hypoxia ([place])   . JVD (jugular venous distension)   . Pulmonary congestion   . Acquired contracture of Achilles tendon, right 08/19/2016  . Amputated great toe, right ([place]) 07/18/2016  . Onychomycosis 05/29/2016  . Diabetic polyneuropathy associated with type 2 diabetes mellitus ([place]) 04/09/2016  . Right foot  ulcer, limited to breakdown of skin ([place]) 04/09/2016  . Sepsis ([place]) 02/06/2015  . Unspecified hereditary and idiopathic peripheral neuropathy 09/15/2013  . Malaise 08/14/2013  . Intractable nausea and vomiting 08/14/2013  . Acute gastroenteritis 08/14/2013  . Hypertension 08/14/2013  . Chronic back pain 08/14/2013  . Glaucoma 08/14/2013  . Headache 08/14/2013  . Hypertensive urgency 08/14/2013   Past Medical History:  Diagnosis Date  . Arthritis   . CAD in native artery    s/p stent in 11/18  . CHF (congestive heart failure) ([place])    normal echo in 11/18  . CKD (chronic kidney disease)   . DDD (degenerative disc disease), cervical   . Diabetes mellitus with complication (HCC)    Type II  . Diabetic neuropathy ([place])   . Diabetic retinopathy ([place])   . GERD (gastroesophageal reflux disease)   . Glaucoma   . Hypertension   . Renal disorder    Stage II    Family History  Problem Relation Age of Onset  . Diabetes Other   . Hyperlipidemia Other   . Hypertension Other   . Stroke Other   . Alzheimer's disease Other   . Thyroid disease Mother   . Diabetes Mellitus II Father     Past Surgical History:  Procedure Laterality Date  . AMPUTATION Right 07/09/2016   Procedure: Right Great Toe Amputation at Metatarsophalangeal Joint;  Surgeon: [name], MD;  Location: [facility];  Service: Orthopedics;  Laterality: Right;  . BACK SURGERY     4  . CARDIAC CATHETERIZATION    . CORONARY STENT INTERVENTION N/A 04/06/2017   Procedure: CORONARY STENT INTERVENTION;  Surgeon: Nigel Mormon, MD;  Location: [facility];  Service: Cardiovascular;  Laterality: N/A;  . CORONARY STENT INTERVENTION N/A 04/07/2017   Procedure: CORONARY STENT INTERVENTION;  Surgeon: Nigel Mormon, MD;  Location: [facility];  Service: Cardiovascular;  Laterality: N/A;  . CYST EXCISION     on Back  . JOINT REPLACEMENT    . LAPAROSCOPIC ABDOMINAL EXPLORATION N/A 11/26/2017   Procedure:  LAPAROSCOPIC ABDOMINAL EXPLORATION, DRAINAGE OF APPENDICEAL ABCESS. PLACEMENT OF DRAIN;  Surgeon: Alphonsa Overall, MD;  Location: [facility];  Service: General;  Laterality: N/A;  . RIGHT/LEFT HEART CATH AND CORONARY ANGIOGRAPHY N/A 04/06/2017   Procedure: RIGHT/LEFT HEART CATH AND CORONARY ANGIOGRAPHY;  Surgeon: Nigel Mormon, MD;  Location: [facility];  Service: Cardiovascular;  Laterality: N/A;  . TOTAL HIP ARTHROPLASTY     Right    Social History   Occupational History  . Occupation: Retired  Tobacco Use  . Smoking status: Never Smoker  . Smokeless tobacco: Never Used  Substance and Sexual Activity  . Alcohol use: Not Currently  . Drug use: No  . Sexual activity: Yes

## 2018-05-05 NOTE — Addendum Note (Signed)
Addended by: Dondra Prader R on: 05/05/2018 01:35 PM   Modules accepted: Orders

## 2018-05-19 HISTORY — PX: TOE SURGERY: SHX1073

## 2018-05-24 ENCOUNTER — Encounter (INDEPENDENT_AMBULATORY_CARE_PROVIDER_SITE_OTHER): Payer: Self-pay | Admitting: Orthopedic Surgery

## 2018-05-24 ENCOUNTER — Ambulatory Visit (INDEPENDENT_AMBULATORY_CARE_PROVIDER_SITE_OTHER): Payer: Medicare Other | Admitting: Orthopedic Surgery

## 2018-05-24 VITALS — Ht 74.0 in | Wt 233.0 lb

## 2018-05-24 DIAGNOSIS — Z89411 Acquired absence of right great toe: Secondary | ICD-10-CM | POA: Diagnosis not present

## 2018-05-24 DIAGNOSIS — L97514 Non-pressure chronic ulcer of other part of right foot with necrosis of bone: Secondary | ICD-10-CM

## 2018-05-24 DIAGNOSIS — E1142 Type 2 diabetes mellitus with diabetic polyneuropathy: Secondary | ICD-10-CM

## 2018-05-24 DIAGNOSIS — B351 Tinea unguium: Secondary | ICD-10-CM | POA: Diagnosis not present

## 2018-05-24 DIAGNOSIS — S98111A Complete traumatic amputation of right great toe, initial encounter: Secondary | ICD-10-CM

## 2018-05-24 NOTE — Progress Notes (Signed)
Office Visit Note   Patient: David [name]           Date of Birth: Mar 03, 1946           MRN: [id number] Visit Date: 05/24/2018              Requested by: [name], MD [address] [place] [address] [place], [place] [address] PCP: [name], MD  Chief Complaint  Patient presents with  . Right Foot - Follow-up, Pain      HPI: The patient is a 73 year old gentleman with diabetic insensate neuropathy of bilateral lower extremities and venous insufficiency who is seen for follow-up of his right second toe ulceration.  The ulceration has worsened and does probe to bone.  He has been utilizing Vive compression stockings, and has been on doxycycline for the past 2 weeks.  He has been trying to minimize his weightbearing as much as possible.  Assessment & Plan: Visit Diagnoses:  1. Right second toe ulcer, with necrosis of bone ([place])   2. Diabetic polyneuropathy associated with type 2 diabetes mellitus ([address] [place] [address])   3. Onychomycosis   4. Amputated great toe, right ([place])     Plan: We will plan for the OR this Friday for amputation of the right second toe.  The patient would like to try to only amputate the second toes he does feel like having the right great toe amputated previously did affect his ability to walk.  We did have a long discussion about control of his diabetes and this effect on healing and the patient reports he is going to try to improve his compliance with his dietary restrictions.  Given low plan for OR this Friday and see him in 1 week postop.  Follow-Up Instructions: Return in about 2 weeks (around 06/07/2018).   Ortho Exam  Patient is alert, oriented, no adenopathy, well-dressed, normal affect, normal respiratory effort. He has worsening of the right second toe dorsal ulcer which is approximately 1 cm in diameter and does probe to bone.  He has continued swelling and erythema and scant drainage from the area.  He has right first metatarsal plantar callus which is  improved and without signs of current cellulitis or infection.  He has excellent palpable pedal pulse.  Imaging: No results found. No images are attached to the encounter.  Labs: Lab Results  Component Value Date   HGBA1C 6.3 (H) 12/19/2017   HGBA1C 7.5 (H) 11/24/2017   HGBA1C 7.2 (H) 04/01/2017   ESRSEDRATE 73 (H) 02/07/2015   ESRSEDRATE 22 (H) 09/15/2013   CRP 8.2 (H) 02/07/2015   REPTSTATUS 11/29/2017 FINAL 11/28/2017   CULT  11/28/2017    NO GROWTH Performed at [facility] [address], [place], [place] [address]      Lab Results  Component Value Date   ALBUMIN 2.3 (L) 12/09/2017   ALBUMIN 2.7 (L) 11/24/2017   ALBUMIN 3.4 (L) 09/03/2016    Body mass index is 29.92 kg/m.  Orders:  No orders of the defined types were placed in this encounter.  No orders of the defined types were placed in this encounter.    Procedures: No procedures performed  Clinical Data: No additional findings.  ROS:  All other systems negative, except as noted in the HPI. Review of Systems  Objective: Vital Signs: Ht 6\' 2"  (1.88 m)   Wt 233 lb (105.7 kg)   BMI 29.92 kg/m   Specialty Comments:  No specialty comments available.  PMFS History: Patient  Active Problem List   Diagnosis Date Noted  . Snoring 02/11/2018  . CHF (congestive heart failure) ([place]) 12/22/2017  . Acute on chronic heart failure ([place]) 12/19/2017  . At risk for adverse drug reaction 12/15/2017  . Chronic pain syndrome 12/15/2017  . Appendicitis 11/24/2017  . Acute on chronic congestive heart failure ([place])   . Status post coronary artery stent placement   . Acute renal failure superimposed on stage 3 chronic kidney disease ([place])   . Pneumonia 03/30/2017  . Insulin-requiring or dependent type II diabetes mellitus ([place]) 03/29/2017  . Acute hypoxemic respiratory failure ([place]) 03/29/2017  . Acute respiratory failure with hypoxia ([place])   . JVD (jugular venous distension)   . Pulmonary congestion     . Acquired contracture of Achilles tendon, right 08/19/2016  . Amputated great toe, right ([place]) 07/18/2016  . Onychomycosis 05/29/2016  . Diabetic polyneuropathy associated with type 2 diabetes mellitus ([place]) 04/09/2016  . Right foot ulcer, limited to breakdown of skin ([place]) 04/09/2016  . Sepsis ([place]) 02/06/2015  . Unspecified hereditary and idiopathic peripheral neuropathy 09/15/2013  . Malaise 08/14/2013  . Intractable nausea and vomiting 08/14/2013  . Acute gastroenteritis 08/14/2013  . Hypertension 08/14/2013  . Chronic back pain 08/14/2013  . Glaucoma 08/14/2013  . Headache 08/14/2013  . Hypertensive urgency 08/14/2013   Past Medical History:  Diagnosis Date  . Arthritis   . CAD in native artery    s/p stent in 11/18  . CHF (congestive heart failure) ([place])    normal echo in 11/18  . CKD (chronic kidney disease)   . DDD (degenerative disc disease), cervical   . Diabetes mellitus with complication (HCC)    Type II  . Diabetic neuropathy ([place])   . Diabetic retinopathy ([place])   . GERD (gastroesophageal reflux disease)   . Glaucoma   . Hypertension   . Renal disorder    Stage II    Family History  Problem Relation Age of Onset  . Diabetes Other   . Hyperlipidemia Other   . Hypertension Other   . Stroke Other   . Alzheimer's disease Other   . Thyroid disease Mother   . Diabetes Mellitus II Father     Past Surgical History:  Procedure Laterality Date  . AMPUTATION Right 07/09/2016   Procedure: Right Great Toe Amputation at Metatarsophalangeal Joint;  Surgeon: [name], MD;  Location: [facility];  Service: Orthopedics;  Laterality: Right;  . BACK SURGERY     4  . CARDIAC CATHETERIZATION    . CORONARY STENT INTERVENTION N/A 04/06/2017   Procedure: CORONARY STENT INTERVENTION;  Surgeon: Nigel Mormon, MD;  Location: [facility];  Service: Cardiovascular;  Laterality: N/A;  . CORONARY STENT INTERVENTION N/A 04/07/2017   Procedure: CORONARY STENT  INTERVENTION;  Surgeon: Nigel Mormon, MD;  Location: [facility];  Service: Cardiovascular;  Laterality: N/A;  . CYST EXCISION     on Back  . JOINT REPLACEMENT    . LAPAROSCOPIC ABDOMINAL EXPLORATION N/A 11/26/2017   Procedure: LAPAROSCOPIC ABDOMINAL EXPLORATION, DRAINAGE OF APPENDICEAL ABCESS. PLACEMENT OF DRAIN;  Surgeon: Alphonsa Overall, MD;  Location: [facility];  Service: General;  Laterality: N/A;  . RIGHT/LEFT HEART CATH AND CORONARY ANGIOGRAPHY N/A 04/06/2017   Procedure: RIGHT/LEFT HEART CATH AND CORONARY ANGIOGRAPHY;  Surgeon: Nigel Mormon, MD;  Location: [facility];  Service: Cardiovascular;  Laterality: N/A;  . TOTAL HIP ARTHROPLASTY     Right    Social History  Occupational History  . Occupation: Retired  Tobacco Use  . Smoking status: Never Smoker  . Smokeless tobacco: Never Used  Substance and Sexual Activity  . Alcohol use: Not Currently  . Drug use: No  . Sexual activity: Yes

## 2018-05-25 ENCOUNTER — Ambulatory Visit (INDEPENDENT_AMBULATORY_CARE_PROVIDER_SITE_OTHER): Payer: Self-pay | Admitting: Physician Assistant

## 2018-05-26 ENCOUNTER — Ambulatory Visit (INDEPENDENT_AMBULATORY_CARE_PROVIDER_SITE_OTHER): Payer: Medicare Other | Admitting: Family

## 2018-05-27 ENCOUNTER — Encounter (HOSPITAL_COMMUNITY): Payer: Self-pay | Admitting: *Deleted

## 2018-05-27 NOTE — Progress Notes (Signed)
David [name] called in, we have called him 3 days.  Patient said that he can not talk long- too many things to do before surgery. PCP is Dr Delfina Redwood, cardiologist is Dr Nadyne Coombes; I have requested records from both offices.  David [name] quickly went over medications- "they are the same as they were when I left last time."  Patient reports that Plavix was stopped in November, Levemir has been increased to 28 units every am. David [name] states that CBGs are good-, wouldn't not elaborate, they rarely are less than 70. I instructed patient that if CBG is greater than 70 to take 1/2 dose of Poet- 14 units. Do not take Humulog in am.  I instructed patient to check CBG after awaking and every 2 hours until arrival  to the hospital.  I Instructed patient if CBG is less than 70 to  drink 1/2 cup of a clear juice. Recheck CBG in 15 minutes then call pre- op desk at [phone] for further instructions. If scheduled to receive Insulin, do not take Insulin. David [name] denies chest pain or shortness of breath.

## 2018-05-27 NOTE — Anesthesia Preprocedure Evaluation (Addendum)
Anesthesia Evaluation  Patient identified by MRN, date of birth, ID band Patient awake    Reviewed: Allergy & Precautions, NPO status , Patient's Chart, lab work & pertinent test results, reviewed documented beta blocker date and time   Airway Mallampati: II  TM Distance: >3 FB Neck ROM: Full    Dental  (+) Poor Dentition   Pulmonary neg pulmonary ROS,    Pulmonary exam normal breath sounds clear to auscultation       Cardiovascular hypertension, Pt. on medications and Pt. on home beta blockers + CAD, + Cardiac Stents (x 3 or 4) and +CHF  Normal cardiovascular exam Rhythm:Regular Rate:Normal  ECHO: The right ventricular systolic pressure was increased consistent with severe pulmonary hypertension. LV EF: 50% -   55%  ECG: SR, rate 86  Sees cargiologist ([place])   Neuro/Psych  Headaches, PSYCHIATRIC DISORDERS Anxiety Diabetic neuropathy    GI/Hepatic negative GI ROS, Neg liver ROS,   Endo/Other  diabetes, Insulin Dependent  Renal/GU CRFRenal disease     Musculoskeletal negative musculoskeletal ROS (+)   Abdominal   Peds  Hematology  (+) anemia , HLD   Anesthesia Other Findings Right 2nd Toe Osteomyelitis  Reproductive/Obstetrics                            Anesthesia Physical Anesthesia Plan  ASA: III  Anesthesia Plan: MAC   Post-op Pain Management:    Induction: Intravenous  PONV Risk Score and Plan: 1 and Propofol infusion, Treatment may vary due to age or medical condition and Ondansetron  Airway Management Planned: Simple Face Mask  Additional Equipment:   Intra-op Plan:   Post-operative Plan:   Informed Consent: I have reviewed the patients History and Physical, chart, labs and discussed the procedure including the risks, benefits and alternatives for the proposed anesthesia with the patient or authorized representative who has indicated his/her understanding and  acceptance.   Dental advisory given  Plan Discussed with: CRNA  Anesthesia Plan Comments:        Anesthesia Quick Evaluation

## 2018-05-28 ENCOUNTER — Ambulatory Visit (HOSPITAL_COMMUNITY): Payer: Medicare Other | Admitting: Anesthesiology

## 2018-05-28 ENCOUNTER — Ambulatory Visit (HOSPITAL_COMMUNITY)
Admission: RE | Admit: 2018-05-28 | Discharge: 2018-05-28 | Disposition: A | Discharge status: Home / Self Care | Payer: Medicare Other | Attending: Orthopedic Surgery | Admitting: Orthopedic Surgery

## 2018-05-28 ENCOUNTER — Encounter (HOSPITAL_COMMUNITY)
Admission: RE | Disposition: A | Discharge status: Home / Self Care | Payer: Self-pay | Source: Home / Self Care | Attending: Orthopedic Surgery

## 2018-05-28 ENCOUNTER — Other Ambulatory Visit: Payer: Self-pay

## 2018-05-28 ENCOUNTER — Encounter (HOSPITAL_COMMUNITY): Payer: Self-pay | Admitting: *Deleted

## 2018-05-28 DIAGNOSIS — Z794 Long term (current) use of insulin: Secondary | ICD-10-CM | POA: Insufficient documentation

## 2018-05-28 DIAGNOSIS — E11319 Type 2 diabetes mellitus with unspecified diabetic retinopathy without macular edema: Secondary | ICD-10-CM | POA: Insufficient documentation

## 2018-05-28 DIAGNOSIS — N182 Chronic kidney disease, stage 2 (mild): Secondary | ICD-10-CM | POA: Insufficient documentation

## 2018-05-28 DIAGNOSIS — E1140 Type 2 diabetes mellitus with diabetic neuropathy, unspecified: Secondary | ICD-10-CM | POA: Diagnosis not present

## 2018-05-28 DIAGNOSIS — M869 Osteomyelitis, unspecified: Secondary | ICD-10-CM | POA: Diagnosis present

## 2018-05-28 DIAGNOSIS — I130 Hypertensive heart and chronic kidney disease with heart failure and stage 1 through stage 4 chronic kidney disease, or unspecified chronic kidney disease: Secondary | ICD-10-CM | POA: Diagnosis not present

## 2018-05-28 DIAGNOSIS — Z833 Family history of diabetes mellitus: Secondary | ICD-10-CM | POA: Insufficient documentation

## 2018-05-28 DIAGNOSIS — I509 Heart failure, unspecified: Secondary | ICD-10-CM | POA: Insufficient documentation

## 2018-05-28 DIAGNOSIS — I2510 Atherosclerotic heart disease of native coronary artery without angina pectoris: Secondary | ICD-10-CM | POA: Insufficient documentation

## 2018-05-28 DIAGNOSIS — H409 Unspecified glaucoma: Secondary | ICD-10-CM | POA: Diagnosis not present

## 2018-05-28 DIAGNOSIS — Z8249 Family history of ischemic heart disease and other diseases of the circulatory system: Secondary | ICD-10-CM | POA: Diagnosis not present

## 2018-05-28 DIAGNOSIS — E1151 Type 2 diabetes mellitus with diabetic peripheral angiopathy without gangrene: Secondary | ICD-10-CM | POA: Insufficient documentation

## 2018-05-28 DIAGNOSIS — Z955 Presence of coronary angioplasty implant and graft: Secondary | ICD-10-CM | POA: Insufficient documentation

## 2018-05-28 DIAGNOSIS — E1169 Type 2 diabetes mellitus with other specified complication: Secondary | ICD-10-CM | POA: Insufficient documentation

## 2018-05-28 DIAGNOSIS — M1990 Unspecified osteoarthritis, unspecified site: Secondary | ICD-10-CM | POA: Diagnosis not present

## 2018-05-28 DIAGNOSIS — E1122 Type 2 diabetes mellitus with diabetic chronic kidney disease: Secondary | ICD-10-CM | POA: Diagnosis not present

## 2018-05-28 DIAGNOSIS — M86671 Other chronic osteomyelitis, right ankle and foot: Secondary | ICD-10-CM | POA: Diagnosis not present

## 2018-05-28 HISTORY — PX: AMPUTATION: SHX166

## 2018-05-28 LAB — CBC
HEMATOCRIT: 32.6 % — AB (ref 39.0–52.0)
Hemoglobin: 10.2 g/dL — ABNORMAL LOW (ref 13.0–17.0)
MCH: 33.7 pg (ref 26.0–34.0)
MCHC: 31.3 g/dL (ref 30.0–36.0)
MCV: 107.6 fL — ABNORMAL HIGH (ref 80.0–100.0)
Platelets: 239 10*3/uL (ref 150–400)
RBC: 3.03 MIL/uL — ABNORMAL LOW (ref 4.22–5.81)
RDW: 11 % — AB (ref 11.5–15.5)
WBC: 7.5 10*3/uL (ref 4.0–10.5)
nRBC: 0 % (ref 0.0–0.2)

## 2018-05-28 LAB — BASIC METABOLIC PANEL
Anion gap: 7 (ref 5–15)
BUN: 31 mg/dL — ABNORMAL HIGH (ref 8–23)
CHLORIDE: 108 mmol/L (ref 98–111)
CO2: 23 mmol/L (ref 22–32)
CREATININE: 2.21 mg/dL — AB (ref 0.61–1.24)
Calcium: 8.8 mg/dL — ABNORMAL LOW (ref 8.9–10.3)
GFR calc Af Amer: 33 mL/min — ABNORMAL LOW (ref >60)
GFR calc non Af Amer: 29 mL/min — ABNORMAL LOW (ref >60)
Glucose, Bld: 198 mg/dL — ABNORMAL HIGH (ref 70–99)
Potassium: 4.1 mmol/L (ref 3.5–5.1)
Sodium: 138 mmol/L (ref 135–145)

## 2018-05-28 LAB — HEMOGLOBIN A1C
Hgb A1c MFr Bld: 7.1 % — ABNORMAL HIGH (ref 4.8–5.6)
Mean Plasma Glucose: 157.07 mg/dL

## 2018-05-28 LAB — GLUCOSE, CAPILLARY
Glucose-Capillary: 194 mg/dL — ABNORMAL HIGH (ref 70–99)
Glucose-Capillary: 174 mg/dL — ABNORMAL HIGH (ref 70–99)

## 2018-05-28 SURGERY — AMPUTATION DIGIT
Anesthesia: Monitor Anesthesia Care | Site: Foot | Laterality: Right

## 2018-05-28 MED ORDER — CEFAZOLIN SODIUM-DEXTROSE 2-4 GM/100ML-% IV SOLN
INTRAVENOUS | Status: AC
  Filled 2018-05-28: qty 100

## 2018-05-28 MED ORDER — FENTANYL CITRATE (PF) 250 MCG/5ML IJ SOLN
INTRAMUSCULAR | Status: AC
  Filled 2018-05-28: qty 5

## 2018-05-28 MED ORDER — CEFAZOLIN SODIUM-DEXTROSE 2-4 GM/100ML-% IV SOLN
2.0000 g | INTRAVENOUS | Status: AC
  Administered 2018-05-28: 2 g via INTRAVENOUS

## 2018-05-28 MED ORDER — LIDOCAINE HCL 1 % IJ SOLN
INTRAMUSCULAR | Status: DC | PRN
  Administered 2018-05-28: 10 mL

## 2018-05-28 MED ORDER — LIDOCAINE HCL (PF) 1 % IJ SOLN
INTRAMUSCULAR | Status: AC
  Filled 2018-05-28: qty 30

## 2018-05-28 MED ORDER — PROPOFOL 1000 MG/100ML IV EMUL
INTRAVENOUS | Status: AC
  Filled 2018-05-28: qty 300

## 2018-05-28 MED ORDER — PROPOFOL 10 MG/ML IV BOLUS
INTRAVENOUS | Status: DC | PRN
  Administered 2018-05-28: 30 mg via INTRAVENOUS

## 2018-05-28 MED ORDER — FENTANYL CITRATE (PF) 250 MCG/5ML IJ SOLN
INTRAMUSCULAR | Status: DC | PRN
  Administered 2018-05-28 (×2): 50 ug via INTRAVENOUS

## 2018-05-28 MED ORDER — CHLORHEXIDINE GLUCONATE 4 % EX LIQD: 60.0000 mL | Freq: Once | CUTANEOUS | Status: DC

## 2018-05-28 MED ORDER — ONDANSETRON HCL 4 MG/2ML IJ SOLN
INTRAMUSCULAR | Status: DC | PRN
  Administered 2018-05-28: 4 mg via INTRAVENOUS

## 2018-05-28 MED ORDER — PROPOFOL 500 MG/50ML IV EMUL
INTRAVENOUS | Status: DC | PRN
  Administered 2018-05-28: 25 ug/kg/min via INTRAVENOUS

## 2018-05-28 MED ORDER — 0.9 % SODIUM CHLORIDE (POUR BTL) OPTIME
TOPICAL | Status: DC | PRN
  Administered 2018-05-28: 1000 mL

## 2018-05-28 MED ORDER — LACTATED RINGERS IV SOLN
INTRAVENOUS | Status: DC | PRN
  Administered 2018-05-28: 10:00:00 via INTRAVENOUS

## 2018-05-28 SURGICAL SUPPLY — 29 items
BLADE SURG 21 STRL SS (BLADE) ×2 IMPLANT
BNDG COHESIVE 4X5 TAN STRL (GAUZE/BANDAGES/DRESSINGS) ×2 IMPLANT
BNDG ESMARK 4X9 LF (GAUZE/BANDAGES/DRESSINGS) IMPLANT
BNDG GAUZE ELAST 4 BULKY (GAUZE/BANDAGES/DRESSINGS) ×2 IMPLANT
COVER SURGICAL LIGHT HANDLE (MISCELLANEOUS) ×4 IMPLANT
COVER WAND RF STERILE (DRAPES) ×2 IMPLANT
DRAPE U-SHAPE 47X51 STRL (DRAPES) ×2 IMPLANT
DRSG ADAPTIC 3X8 NADH LF (GAUZE/BANDAGES/DRESSINGS) ×1 IMPLANT
DRSG PAD ABDOMINAL 8X10 ST (GAUZE/BANDAGES/DRESSINGS) ×2 IMPLANT
DURAPREP 26ML APPLICATOR (WOUND CARE) ×2 IMPLANT
ELECT REM PT RETURN 9FT ADLT (ELECTROSURGICAL) ×2
ELECTRODE REM PT RTRN 9FT ADLT (ELECTROSURGICAL) ×1 IMPLANT
GAUZE SPONGE 4X4 12PLY STRL (GAUZE/BANDAGES/DRESSINGS) ×1 IMPLANT
GLOVE BIOGEL PI IND STRL 9 (GLOVE) ×1 IMPLANT
GLOVE BIOGEL PI INDICATOR 9 (GLOVE) ×1
GLOVE SURG ORTHO 9.0 STRL STRW (GLOVE) ×2 IMPLANT
GOWN STRL REUS W/ TWL XL LVL3 (GOWN DISPOSABLE) ×2 IMPLANT
GOWN STRL REUS W/TWL XL LVL3 (GOWN DISPOSABLE) ×2
KIT BASIN OR (CUSTOM PROCEDURE TRAY) ×2 IMPLANT
KIT TURNOVER KIT B (KITS) ×2 IMPLANT
MANIFOLD NEPTUNE II (INSTRUMENTS) ×2 IMPLANT
NEEDLE 22X1 1/2 (OR ONLY) (NEEDLE) IMPLANT
NS IRRIG 1000ML POUR BTL (IV SOLUTION) ×2 IMPLANT
PACK ORTHO EXTREMITY (CUSTOM PROCEDURE TRAY) ×2 IMPLANT
PAD ABD 8X10 STRL (GAUZE/BANDAGES/DRESSINGS) ×1 IMPLANT
PAD ARMBOARD 7.5X6 YLW CONV (MISCELLANEOUS) ×4 IMPLANT
SUT ETHILON 2 0 PSLX (SUTURE) ×2 IMPLANT
SYR CONTROL 10ML LL (SYRINGE) IMPLANT
TOWEL OR 17X26 10 PK STRL BLUE (TOWEL DISPOSABLE) ×2 IMPLANT

## 2018-05-28 NOTE — Anesthesia Postprocedure Evaluation (Signed)
Anesthesia Post Note  Patient: David [name]  Procedure(s) Performed: RIGHT SECOND TOE AMPUTATION (Right Foot)     Patient location during evaluation: PACU Anesthesia Type: MAC Level of consciousness: awake and alert Pain management: pain level controlled Vital Signs Assessment: post-procedure vital signs reviewed and stable Respiratory status: spontaneous breathing, nonlabored ventilation, respiratory function stable and patient connected to nasal cannula oxygen Cardiovascular status: stable and blood pressure returned to baseline Postop Assessment: no apparent nausea or vomiting Anesthetic complications: no    Last Vitals:  Vitals:   05/28/18 1100 05/28/18 1115  BP: 130/64 140/68  Pulse: (!) 50 (!) 52  Resp: (!) 7 10  Temp:    SpO2: 100% 100%    Last Pain:  Vitals:   05/28/18 1115  TempSrc:   PainSc: 0-No pain                 Krisha Beegle P Analycia Khokhar

## 2018-05-28 NOTE — H&P (Signed)
David [name] is an 73 y.o. male.   Chief Complaint: Right second toe osteomyelitis  HPI: The patient is a 73 year old gentleman with diabetic insensate neuropathy of bilateral lower extremities and venous insufficiency who is seen for follow-up of his right second toe ulceration.  The ulceration has worsened and does probe to bone.  He has been utilizing Vive compression stockings, and has been on doxycycline for the past 2 weeks.  He presents for right second toe amputation.   Past Medical History:  Diagnosis Date  . Arthritis   . CAD in native artery    s/p stent in 11/18  . CHF (congestive heart failure) ([place])    normal echo in 11/18  . CKD (chronic kidney disease)   . DDD (degenerative disc disease), cervical   . Diabetes mellitus with complication (HCC)    Type II  . Diabetic neuropathy ([place])   . Diabetic retinopathy ([place])   . GERD (gastroesophageal reflux disease)   . Glaucoma   . Hypertension   . Renal disorder    Stage II, 05/27/2018- patient said "I do not have kidney disease"    Past Surgical History:  Procedure Laterality Date  . AMPUTATION Right 07/09/2016   Procedure: Right Great Toe Amputation at Metatarsophalangeal Joint;  Surgeon: [name], MD;  Location: [facility];  Service: Orthopedics;  Laterality: Right;  . BACK SURGERY     4  . CARDIAC CATHETERIZATION    . CORONARY STENT INTERVENTION N/A 04/06/2017   Procedure: CORONARY STENT INTERVENTION;  Surgeon: Nigel Mormon, MD;  Location: [facility];  Service: Cardiovascular;  Laterality: N/A;  . CORONARY STENT INTERVENTION N/A 04/07/2017   Procedure: CORONARY STENT INTERVENTION;  Surgeon: Nigel Mormon, MD;  Location: [facility];  Service: Cardiovascular;  Laterality: N/A;  . CYST EXCISION     on Back  . JOINT REPLACEMENT    . LAPAROSCOPIC ABDOMINAL EXPLORATION N/A 11/26/2017   Procedure: LAPAROSCOPIC ABDOMINAL EXPLORATION, DRAINAGE OF APPENDICEAL ABCESS. PLACEMENT OF DRAIN;  Surgeon:  Alphonsa Overall, MD;  Location: [facility];  Service: General;  Laterality: N/A;  . RIGHT/LEFT HEART CATH AND CORONARY ANGIOGRAPHY N/A 04/06/2017   Procedure: RIGHT/LEFT HEART CATH AND CORONARY ANGIOGRAPHY;  Surgeon: Nigel Mormon, MD;  Location: [facility];  Service: Cardiovascular;  Laterality: N/A;  . TOTAL HIP ARTHROPLASTY     Right     Family History  Problem Relation Age of Onset  . Diabetes Other   . Hyperlipidemia Other   . Hypertension Other   . Stroke Other   . Alzheimer's disease Other   . Thyroid disease Mother   . Diabetes Mellitus II Father    Social History:  reports that he has never smoked. He has never used smokeless tobacco. He reports previous alcohol use. He reports that he does not use drugs.  Allergies: No Known Allergies  No medications prior to admission.    No results found for this or any previous visit (from the past 48 hour(s)). No results found.  Review of Systems  All other systems reviewed and are negative.   There were no vitals taken for this visit. Physical Exam  Constitutional: He is oriented to person, place, and time. He appears well-developed and well-nourished. No distress.  HENT:  Head: Normocephalic and atraumatic.  Neck: Neck supple. No tracheal deviation present. No thyromegaly present.  Cardiovascular: Normal rate and regular rhythm.  Respiratory: Effort normal. No respiratory distress.  GI: Soft. He exhibits  no distension.  Neurological: He is alert and oriented to person, place, and time. No cranial nerve deficit.  Skin: Skin is warm.  Psychiatric: He has a normal mood and affect. His behavior is normal. Judgment and thought content normal.     Assessment/Plan Right second toe osteomyelitis- Presents for right second toe amputation. The procedure and risks and benefits were reviewed with the patient and he desires to proceed and consent was obtained.   Erlinda Hong, PA-C 05/28/2018, 7:08 AM [facility] [phone]

## 2018-05-28 NOTE — Progress Notes (Signed)
Orthopedic Tech Progress Note Patient Details:  David [name] 05-Jun-1945 [id number]  Ortho Devices Type of Ortho Device: Postop shoe/boot Ortho Device/Splint Interventions: Ordered, Application, Adjustment   Post Interventions Patient Tolerated: Well Instructions Provided: Care of device, Adjustment of device   Karolee Stamps 05/28/2018, 10:54 AM

## 2018-05-28 NOTE — Transfer of Care (Signed)
Immediate Anesthesia Transfer of Care Note  Patient: David [name]  Procedure(s) Performed: RIGHT SECOND TOE AMPUTATION (Right Foot)  Patient Location: PACU  Anesthesia Type:MAC  Level of Consciousness: awake, alert  and oriented  Airway & Oxygen Therapy: Patient Spontanous Breathing  Post-op Assessment: Report given to RN and Post -op Vital signs reviewed and stable  Post vital signs: Reviewed and stable  Last Vitals:  Vitals Value Taken Time  BP 130/63   Temp    Pulse 56 05/28/2018 10:14 AM  Resp 13 05/28/2018 10:14 AM  SpO2 100 % 05/28/2018 10:14 AM  Vitals shown include unvalidated device data.  Last Pain:  Vitals:   05/28/18 0832  TempSrc:   PainSc: 0-No pain      Patients Stated Pain Goal: 4 (59/16/38 4665)  Complications: No apparent anesthesia complications

## 2018-05-28 NOTE — Op Note (Signed)
05/28/2018  10:11 AM  PATIENT:  David [name]    PRE-OPERATIVE DIAGNOSIS:  Right Second Toe Osteomyelitis  POST-OPERATIVE DIAGNOSIS:  Same  PROCEDURE:  RIGHT SECOND TOE AMPUTATION  SURGEON:  [name], MD  PHYSICIAN ASSISTANT:None ANESTHESIA:   General  PREOPERATIVE INDICATIONS:  David [name] is a  72 y.o. male with a diagnosis of Right Second Toe Osteomyelitis who failed conservative measures and elected for surgical management.    The risks benefits and alternatives were discussed with the patient preoperatively including but not limited to the risks of infection, bleeding, nerve injury, cardiopulmonary complications, the need for revision surgery, among others, and the patient was willing to proceed.  OPERATIVE IMPLANTS: None.  @ENCIMAGES @  OPERATIVE FINDINGS: Minimal petechial bleeding.  OPERATIVE PROCEDURE: Patient was brought the operating room and underwent a digital block.  After adequate levels of anesthesia were obtained patient's right lower extremity was prepped using DuraPrep draped into a sterile field a timeout was called.  A fishmouth incision was made just distal to the MTP joint.  The second toe was amputated through the MTP joint.  Electrocautery was used for hemostasis the wound was irrigated with normal saline.  There was minimal petechial bleeding.  The incision was closed using 2-0 nylon.  A sterile dressing was applied patient was taken the PACU in stable condition.   DISCHARGE PLANNING:  Antibiotic duration: Preoperative antibiotics  Weightbearing: Touchdown weightbearing on the right  Pain medication: Patient has medication from pain management  Dressing care/ Wound VAC: Change dressing to follow-up in the office in 1 week  Ambulatory devices: Walker  Discharge to: Home  Follow-up: In the office 1 week post operative.

## 2018-05-29 ENCOUNTER — Encounter (HOSPITAL_COMMUNITY): Payer: Self-pay | Admitting: Orthopedic Surgery

## 2018-06-04 ENCOUNTER — Inpatient Hospital Stay (INDEPENDENT_AMBULATORY_CARE_PROVIDER_SITE_OTHER): Payer: Medicare Other | Admitting: Physician Assistant

## 2018-06-04 ENCOUNTER — Inpatient Hospital Stay (INDEPENDENT_AMBULATORY_CARE_PROVIDER_SITE_OTHER): Payer: Medicare Other | Admitting: Family Medicine

## 2018-06-08 ENCOUNTER — Encounter (INDEPENDENT_AMBULATORY_CARE_PROVIDER_SITE_OTHER): Payer: Self-pay | Admitting: Physician Assistant

## 2018-06-08 ENCOUNTER — Ambulatory Visit (INDEPENDENT_AMBULATORY_CARE_PROVIDER_SITE_OTHER): Payer: Medicare Other | Admitting: Physician Assistant

## 2018-06-08 VITALS — Ht 74.0 in | Wt 217.0 lb

## 2018-06-08 DIAGNOSIS — Z89411 Acquired absence of right great toe: Secondary | ICD-10-CM

## 2018-06-08 DIAGNOSIS — Z89421 Acquired absence of other right toe(s): Secondary | ICD-10-CM

## 2018-06-08 DIAGNOSIS — E1142 Type 2 diabetes mellitus with diabetic polyneuropathy: Secondary | ICD-10-CM

## 2018-06-08 DIAGNOSIS — S98111A Complete traumatic amputation of right great toe, initial encounter: Secondary | ICD-10-CM

## 2018-06-08 MED ORDER — MUPIROCIN 2 % EX OINT: 1.0000 "application " | TOPICAL_OINTMENT | Freq: Every day | CUTANEOUS | 0 refills | Status: DC

## 2018-06-08 NOTE — Progress Notes (Signed)
Office Visit Note   Patient: David [name]           Date of Birth: Nov 28, 1945           MRN: [id number] Visit Date: 06/08/2018              Requested by: [name], MD [address] [place], [place] [address] PCP: [name], MD  Chief Complaint  Patient presents with  . Right Foot - Routine Post Op      HPI: The patient is a 73 yo gentleman who is seen for post operative follow up following right 2nd toe amputation for osteomyelitis on 05/28/2018. He had previously undergone right great toe amputation for osteomyelitis. He reports he has been walking with his Darco shoe.   Assessment & Plan: Visit Diagnoses:  1. History of complete ray amputation of second toe of right foot ([place])   2. Diabetic polyneuropathy associated with type 2 diabetes mellitus ([place])   3. Amputated great toe, right ([place])     Plan: Will leave sutures in place for at least 2 more weeks. Counseled to elevate as much as possible, and non weight bearing as much as possible. No driving. May wash foot with Dial soap and water and dry gauze to the incisional area. Bactroban ointment to the 1st Metatarsal head ulcer as well. Follow up in 1 week.   Follow-Up Instructions: Return in about 1 week (around 06/15/2018).   Ortho Exam  Patient is alert, oriented, no adenopathy, well-dressed, normal affect, normal respiratory effort. The right foot 2nd toe amputation site is slightly open with scant bloody drainage. No signs of infection or cellulitis. Good pedal pulses. There is a callus and ulcer over the right plantar 1st MTH area which was debrided with a #10 blade knife and the residual ulcer is ~ 1 cm and to fat layer.   Imaging: No results found. No images are attached to the encounter.  Labs: Lab Results  Component Value Date   HGBA1C 7.1 (H) 05/28/2018   HGBA1C 6.3 (H) 12/19/2017   HGBA1C 7.5 (H) 11/24/2017   ESRSEDRATE 73 (H) 02/07/2015   ESRSEDRATE 22 (H) 09/15/2013   CRP 8.2  (H) 02/07/2015   REPTSTATUS 11/29/2017 FINAL 11/28/2017   CULT  11/28/2017    NO GROWTH Performed at [facility] [address], [place], [place] [address]      Lab Results  Component Value Date   ALBUMIN 2.3 (L) 12/09/2017   ALBUMIN 2.7 (L) 11/24/2017   ALBUMIN 3.4 (L) 09/03/2016    Body mass index is 27.86 kg/m.  Orders:  No orders of the defined types were placed in this encounter.  Meds ordered this encounter  Medications  . mupirocin ointment (BACTROBAN) 2 %    Sig: Apply 1 application topically daily.    Dispense:  22 g    Refill:  0     Procedures: No procedures performed  Clinical Data: No additional findings.  ROS:  All other systems negative, except as noted in the HPI. Review of Systems  Objective: Vital Signs: Ht 6\' 2"  (1.88 m)   Wt 217 lb (98.4 kg)   BMI 27.86 kg/m   Specialty Comments:  No specialty comments available.  PMFS History: Patient Active Problem List   Diagnosis Date Noted  . Snoring 02/11/2018  . CHF (congestive heart failure) ([place]) 12/22/2017  . Acute on chronic heart failure ([place]) 12/19/2017  . At risk for adverse drug reaction 12/15/2017  .  Chronic pain syndrome 12/15/2017  . Appendicitis 11/24/2017  . Acute on chronic congestive heart failure ([place])   . Status post coronary artery stent placement   . Acute renal failure superimposed on stage 3 chronic kidney disease ([place])   . Pneumonia 03/30/2017  . Insulin-requiring or dependent type II diabetes mellitus ([place]) 03/29/2017  . Acute hypoxemic respiratory failure ([place]) 03/29/2017  . Acute respiratory failure with hypoxia ([place])   . JVD (jugular venous distension)   . Pulmonary congestion   . Acquired contracture of Achilles tendon, right 08/19/2016  . Amputated great toe, right ([place]) 07/18/2016  . Onychomycosis 05/29/2016  . Diabetic polyneuropathy associated with type 2 diabetes mellitus ([place]) 04/09/2016  . Right foot ulcer, limited to breakdown of skin ([place])  04/09/2016  . Sepsis ([place]) 02/06/2015  . Unspecified hereditary and idiopathic peripheral neuropathy 09/15/2013  . Malaise 08/14/2013  . Intractable nausea and vomiting 08/14/2013  . Acute gastroenteritis 08/14/2013  . Hypertension 08/14/2013  . Chronic back pain 08/14/2013  . Glaucoma 08/14/2013  . Headache 08/14/2013  . Hypertensive urgency 08/14/2013   Past Medical History:  Diagnosis Date  . Arthritis   . CAD in native artery    s/p stent in 11/18  . CHF (congestive heart failure) ([place])    normal echo in 11/18  . CKD (chronic kidney disease)   . DDD (degenerative disc disease), cervical   . Diabetes mellitus with complication (HCC)    Type II  . Diabetic neuropathy ([place])   . Diabetic retinopathy ([place])   . GERD (gastroesophageal reflux disease)   . Glaucoma   . Hypertension   . Renal disorder    Stage II, 05/27/2018- patient said "I do not have kidney disease"    Family History  Problem Relation Age of Onset  . Diabetes Other   . Hyperlipidemia Other   . Hypertension Other   . Stroke Other   . Alzheimer's disease Other   . Thyroid disease Mother   . Diabetes Mellitus II Father     Past Surgical History:  Procedure Laterality Date  . AMPUTATION Right 07/09/2016   Procedure: Right Great Toe Amputation at Metatarsophalangeal Joint;  Surgeon: [name], MD;  Location: [facility];  Service: Orthopedics;  Laterality: Right;  . AMPUTATION Right 05/28/2018   Procedure: RIGHT SECOND TOE AMPUTATION;  Surgeon: [name], MD;  Location: [facility];  Service: Orthopedics;  Laterality: Right;  . BACK SURGERY     4  . CARDIAC CATHETERIZATION    . CORONARY STENT INTERVENTION N/A 04/06/2017   Procedure: CORONARY STENT INTERVENTION;  Surgeon: Nigel Mormon, MD;  Location: [facility];  Service: Cardiovascular;  Laterality: N/A;  . CORONARY STENT INTERVENTION N/A 04/07/2017   Procedure: CORONARY STENT INTERVENTION;  Surgeon: Nigel Mormon, MD;  Location: [facility];  Service: Cardiovascular;  Laterality: N/A;  . CYST EXCISION     on Back  . JOINT REPLACEMENT    . LAPAROSCOPIC ABDOMINAL EXPLORATION N/A 11/26/2017   Procedure: LAPAROSCOPIC ABDOMINAL EXPLORATION, DRAINAGE OF APPENDICEAL ABCESS. PLACEMENT OF DRAIN;  Surgeon: Alphonsa Overall, MD;  Location: [facility];  Service: General;  Laterality: N/A;  . RIGHT/LEFT HEART CATH AND CORONARY ANGIOGRAPHY N/A 04/06/2017   Procedure: RIGHT/LEFT HEART CATH AND CORONARY ANGIOGRAPHY;  Surgeon: Nigel Mormon, MD;  Location: [facility];  Service: Cardiovascular;  Laterality: N/A;  . TOTAL HIP ARTHROPLASTY     Right    Social History   Occupational History  .  Occupation: Retired  Tobacco Use  . Smoking status: Never Smoker  . Smokeless tobacco: Never Used  Substance and Sexual Activity  . Alcohol use: Not Currently  . Drug use: No  . Sexual activity: Yes

## 2018-06-15 ENCOUNTER — Ambulatory Visit (INDEPENDENT_AMBULATORY_CARE_PROVIDER_SITE_OTHER): Payer: Medicare Other | Admitting: Physician Assistant

## 2018-06-15 VITALS — Ht 74.0 in | Wt 217.0 lb

## 2018-06-15 DIAGNOSIS — Z89421 Acquired absence of other right toe(s): Secondary | ICD-10-CM

## 2018-06-15 DIAGNOSIS — Z89411 Acquired absence of right great toe: Secondary | ICD-10-CM

## 2018-06-15 DIAGNOSIS — S98111A Complete traumatic amputation of right great toe, initial encounter: Secondary | ICD-10-CM

## 2018-06-15 DIAGNOSIS — E1142 Type 2 diabetes mellitus with diabetic polyneuropathy: Secondary | ICD-10-CM

## 2018-06-16 ENCOUNTER — Encounter (INDEPENDENT_AMBULATORY_CARE_PROVIDER_SITE_OTHER): Payer: Self-pay | Admitting: Physician Assistant

## 2018-06-16 NOTE — Progress Notes (Signed)
Office Visit Note   Patient: David [name]           Date of Birth: March 19, 1946           MRN: [id number] Visit Date: 06/15/2018              Requested by: [name], MD [address] [place], [place] [address] PCP: [name], MD  Chief Complaint  Patient presents with  . Right Foot - Routine Post Op    05/28/2018 right foot 2nd toe amputation       HPI: Patient is a 73 year old gentleman who was seen for postoperative follow-up following a right foot second toe amputation on 05/28/2018.  He has been ambulating with a quad cane in a postop shoe.  Assessment & Plan: Visit Diagnoses:  1. History of complete ray amputation of second toe of right foot ([place])   2. Diabetic polyneuropathy associated with type 2 diabetes mellitus ([place])   3. Amputated great toe, right ([place])     Plan: Sutures were harvested this visit.  Patient was cautioned to continue to offload as much as possible.  He is going to resume wearing his silver compression stocking.  Recommended some AmLactin for dry skin on both feet.  He will follow-up here in 2 weeks or sooner if he has difficulties in the interim.  Follow-Up Instructions: Return in about 2 weeks (around 06/29/2018).   Ortho Exam  Patient is alert, oriented, no adenopathy, well-dressed, normal affect, normal respiratory effort. The incision is healing well with no signs of infection or cellulitis.  Sutures were removed today.  He has good palpable pedal pulse.  Imaging: No results found.   Labs: Lab Results  Component Value Date   HGBA1C 7.1 (H) 05/28/2018   HGBA1C 6.3 (H) 12/19/2017   HGBA1C 7.5 (H) 11/24/2017   ESRSEDRATE 73 (H) 02/07/2015   ESRSEDRATE 22 (H) 09/15/2013   CRP 8.2 (H) 02/07/2015   REPTSTATUS 11/29/2017 FINAL 11/28/2017   CULT  11/28/2017    NO GROWTH Performed at [facility] [address], [place], [place] [address]      Lab Results  Component Value Date   ALBUMIN 2.3 (L)  12/09/2017   ALBUMIN 2.7 (L) 11/24/2017   ALBUMIN 3.4 (L) 09/03/2016    Body mass index is 27.86 kg/m.  Orders:  No orders of the defined types were placed in this encounter.  No orders of the defined types were placed in this encounter.    Procedures: No procedures performed  Clinical Data: No additional findings.  ROS:  All other systems negative, except as noted in the HPI. Review of Systems  Objective: Vital Signs: Ht 6\' 2"  (1.88 m)   Wt 217 lb (98.4 kg)   BMI 27.86 kg/m   Specialty Comments:  No specialty comments available.  PMFS History: Patient Active Problem List   Diagnosis Date Noted  . Snoring 02/11/2018  . CHF (congestive heart failure) ([place]) 12/22/2017  . Acute on chronic heart failure ([place]) 12/19/2017  . At risk for adverse drug reaction 12/15/2017  . Chronic pain syndrome 12/15/2017  . Appendicitis 11/24/2017  . Acute on chronic congestive heart failure ([place])   . Status post coronary artery stent placement   . Acute renal failure superimposed on stage 3 chronic kidney disease ([place])   . Pneumonia 03/30/2017  . Insulin-requiring or dependent type II diabetes mellitus ([place]) 03/29/2017  . Acute hypoxemic respiratory failure ([place]) 03/29/2017  . Acute respiratory  failure with hypoxia ([place])   . JVD (jugular venous distension)   . Pulmonary congestion   . Acquired contracture of Achilles tendon, right 08/19/2016  . Amputated great toe, right ([place]) 07/18/2016  . Onychomycosis 05/29/2016  . Diabetic polyneuropathy associated with type 2 diabetes mellitus ([place]) 04/09/2016  . Right foot ulcer, limited to breakdown of skin ([place]) 04/09/2016  . Sepsis ([place]) 02/06/2015  . Unspecified hereditary and idiopathic peripheral neuropathy 09/15/2013  . Malaise 08/14/2013  . Intractable nausea and vomiting 08/14/2013  . Acute gastroenteritis 08/14/2013  . Hypertension 08/14/2013  . Chronic back pain 08/14/2013  . Glaucoma 08/14/2013  . Headache 08/14/2013  .  Hypertensive urgency 08/14/2013   Past Medical History:  Diagnosis Date  . Arthritis   . CAD in native artery    s/p stent in 11/18  . CHF (congestive heart failure) ([place])    normal echo in 11/18  . CKD (chronic kidney disease)   . DDD (degenerative disc disease), cervical   . Diabetes mellitus with complication (HCC)    Type II  . Diabetic neuropathy ([place])   . Diabetic retinopathy ([place])   . GERD (gastroesophageal reflux disease)   . Glaucoma   . Hypertension   . Renal disorder    Stage II, 05/27/2018- patient said "I do not have kidney disease"    Family History  Problem Relation Age of Onset  . Diabetes Other   . Hyperlipidemia Other   . Hypertension Other   . Stroke Other   . Alzheimer's disease Other   . Thyroid disease Mother   . Diabetes Mellitus II Father     Past Surgical History:  Procedure Laterality Date  . AMPUTATION Right 07/09/2016   Procedure: Right Great Toe Amputation at Metatarsophalangeal Joint;  Surgeon: [name], MD;  Location: [facility];  Service: Orthopedics;  Laterality: Right;  . AMPUTATION Right 05/28/2018   Procedure: RIGHT SECOND TOE AMPUTATION;  Surgeon: [name], MD;  Location: [facility];  Service: Orthopedics;  Laterality: Right;  . BACK SURGERY     4  . CARDIAC CATHETERIZATION    . CORONARY STENT INTERVENTION N/A 04/06/2017   Procedure: CORONARY STENT INTERVENTION;  Surgeon: Nigel Mormon, MD;  Location: [facility];  Service: Cardiovascular;  Laterality: N/A;  . CORONARY STENT INTERVENTION N/A 04/07/2017   Procedure: CORONARY STENT INTERVENTION;  Surgeon: Nigel Mormon, MD;  Location: [facility];  Service: Cardiovascular;  Laterality: N/A;  . CYST EXCISION     on Back  . JOINT REPLACEMENT    . LAPAROSCOPIC ABDOMINAL EXPLORATION N/A 11/26/2017   Procedure: LAPAROSCOPIC ABDOMINAL EXPLORATION, DRAINAGE OF APPENDICEAL ABCESS. PLACEMENT OF DRAIN;  Surgeon: Alphonsa Overall, MD;  Location: [facility];  Service: General;   Laterality: N/A;  . RIGHT/LEFT HEART CATH AND CORONARY ANGIOGRAPHY N/A 04/06/2017   Procedure: RIGHT/LEFT HEART CATH AND CORONARY ANGIOGRAPHY;  Surgeon: Nigel Mormon, MD;  Location: [facility];  Service: Cardiovascular;  Laterality: N/A;  . TOTAL HIP ARTHROPLASTY     Right    Social History   Occupational History  . Occupation: Retired  Tobacco Use  . Smoking status: Never Smoker  . Smokeless tobacco: Never Used  Substance and Sexual Activity  . Alcohol use: Not Currently  . Drug use: No  . Sexual activity: Yes

## 2018-06-22 ENCOUNTER — Other Ambulatory Visit: Payer: Self-pay | Admitting: Cardiology

## 2018-06-29 ENCOUNTER — Ambulatory Visit (INDEPENDENT_AMBULATORY_CARE_PROVIDER_SITE_OTHER): Payer: Medicare Other | Admitting: Physician Assistant

## 2018-06-29 ENCOUNTER — Encounter (INDEPENDENT_AMBULATORY_CARE_PROVIDER_SITE_OTHER): Payer: Self-pay | Admitting: Physician Assistant

## 2018-06-29 VITALS — Ht 74.0 in | Wt 217.0 lb

## 2018-06-29 DIAGNOSIS — B351 Tinea unguium: Secondary | ICD-10-CM

## 2018-06-29 DIAGNOSIS — Z89421 Acquired absence of other right toe(s): Secondary | ICD-10-CM

## 2018-06-29 DIAGNOSIS — E1142 Type 2 diabetes mellitus with diabetic polyneuropathy: Secondary | ICD-10-CM

## 2018-06-29 MED ORDER — SULFAMETHOXAZOLE-TRIMETHOPRIM 800-160 MG PO TABS: 1.0000 | ORAL_TABLET | Freq: Two times a day (BID) | ORAL | 0 refills | Status: DC

## 2018-06-29 NOTE — Progress Notes (Signed)
Office Visit Note   Patient: David [name]           Date of Birth: [birth year]           MRN: [id number] Visit Date: 06/29/2018              Requested by: [name], MD [address] [place], [place] [address] PCP: [name], MD  Chief Complaint  Patient presents with  . Right Foot - Routine Post Op    05/28/2018 right foot 2nd toe amputation       HPI: The patient is a 73 year old gentleman who is seen for postoperative follow-up following a right foot second toe amputation on 05/28/2018.  He has been ambulating with a cane.  He is wearing a regular shoe.  Assessment & Plan: Visit Diagnoses:  1. History of complete ray amputation of second toe of right foot ([place])   2. Diabetic polyneuropathy associated with type 2 diabetes mellitus ([place])   3. Onychomycosis     Plan: Patient to start on Septra DS 1 p.o. twice daily.  Trimmed onychomycotic nails of the right foot x3.  Also debrided callus over the right first metatarsal head.  He should continue to minimize weightbearing as much as possible and continue his silver compression stockings.  He will follow-up in 2 weeks or sooner should he have difficulty in the interim.  Follow-Up Instructions: Return in about 2 weeks (around 07/13/2018).   Ortho Exam  Patient is alert, oriented, no adenopathy, well-dressed, normal affect, normal respiratory effort. The right second toe amputation site is healing well.  He has residual callus over the right first metatarsal head and this was debrided to healthy-appearing tissue.  There is a small underlying ulcer which is about 4 mm diameter x 1 mm depth without signs of infection or periwound cellulitis.  Did trim onychomycotic nails of the right residual toes x3.  He does have some slight pressure areas over the dorsum of the residual toes with some slight erythema and working to start antibiotics for this.  He has mild peripheral edema.  He has palpable pedal  pulses.  Imaging: No results found.   Labs: Lab Results  Component Value Date   HGBA1C 7.1 (H) 05/28/2018   HGBA1C 6.3 (H) 12/19/2017   HGBA1C 7.5 (H) 11/24/2017   ESRSEDRATE 73 (H) 02/07/2015   ESRSEDRATE 22 (H) 09/15/2013   CRP 8.2 (H) 02/07/2015   REPTSTATUS 11/29/2017 FINAL 11/28/2017   CULT  11/28/2017    NO GROWTH Performed at [facility] [address] [place],  [address]      Lab Results  Component Value Date   ALBUMIN 2.3 (L) 12/09/2017   ALBUMIN 2.7 (L) 11/24/2017   ALBUMIN 3.4 (L) 09/03/2016    Body mass index is 27.86 kg/m.  Orders:  No orders of the defined types were placed in this encounter.  Meds ordered this encounter  Medications  . sulfamethoxazole-trimethoprim (BACTRIM DS,SEPTRA DS) 800-160 MG tablet    Sig: Take 1 tablet by mouth 2 (two) times daily.    Dispense:  20 tablet    Refill:  0     Procedures: No procedures performed  Clinical Data: No additional findings.  ROS:  All other systems negative, except as noted in the HPI. Review of Systems  Objective: Vital Signs: Ht 6\' 2"  (1.88 m)   Wt 217 lb (98.4 kg)   BMI 27.86 kg/m   Specialty Comments:  No specialty comments  available.  PMFS History: Patient Active Problem List   Diagnosis Date Noted  . Snoring 02/11/2018  . CHF (congestive heart failure) ([place]) 12/22/2017  . Acute on chronic heart failure ([place]) 12/19/2017  . At risk for adverse drug reaction 12/15/2017  . Chronic pain syndrome 12/15/2017  . Appendicitis 11/24/2017  . Acute on chronic congestive heart failure ([place])   . Status post coronary artery stent placement   . Acute renal failure superimposed on stage 3 chronic kidney disease ([place])   . Pneumonia 03/30/2017  . Insulin-requiring or dependent type II diabetes mellitus ([place]) 03/29/2017  . Acute hypoxemic respiratory failure ([place]) 03/29/2017  . Acute respiratory failure with hypoxia ([place])   . JVD (jugular venous distension)   . Pulmonary  congestion   . Acquired contracture of Achilles tendon, right 08/19/2016  . Amputated great toe, right ([place]) 07/18/2016  . Onychomycosis 05/29/2016  . Diabetic polyneuropathy associated with type 2 diabetes mellitus ([place]) 04/09/2016  . Right foot ulcer, limited to breakdown of skin ([place]) 04/09/2016  . Sepsis ([place]) 02/06/2015  . Unspecified hereditary and idiopathic peripheral neuropathy 09/15/2013  . Malaise 08/14/2013  . Intractable nausea and vomiting 08/14/2013  . Acute gastroenteritis 08/14/2013  . Hypertension 08/14/2013  . Chronic back pain 08/14/2013  . Glaucoma 08/14/2013  . Headache 08/14/2013  . Hypertensive urgency 08/14/2013   Past Medical History:  Diagnosis Date  . Arthritis   . CAD in native artery    s/p stent in 11/18  . CHF (congestive heart failure) ([place])    normal echo in 11/18  . CKD (chronic kidney disease)   . DDD (degenerative disc disease), cervical   . Diabetes mellitus with complication (HCC)    Type II  . Diabetic neuropathy ([place])   . Diabetic retinopathy ([place])   . GERD (gastroesophageal reflux disease)   . Glaucoma   . Hypertension   . Renal disorder    Stage II, 05/27/2018- patient said "I do not have kidney disease"    Family History  Problem Relation Age of Onset  . Diabetes Other   . Hyperlipidemia Other   . Hypertension Other   . Stroke Other   . Alzheimer's disease Other   . Thyroid disease Mother   . Diabetes Mellitus II Father     Past Surgical History:  Procedure Laterality Date  . AMPUTATION Right 07/09/2016   Procedure: Right Great Toe Amputation at Metatarsophalangeal Joint;  Surgeon: [name], MD;  Location: [facility];  Service: Orthopedics;  Laterality: Right;  . AMPUTATION Right 05/28/2018   Procedure: RIGHT SECOND TOE AMPUTATION;  Surgeon: [name], MD;  Location: [facility];  Service: Orthopedics;  Laterality: Right;  . BACK SURGERY     4  . CARDIAC CATHETERIZATION    . CORONARY STENT INTERVENTION N/A 04/06/2017    Procedure: CORONARY STENT INTERVENTION;  Surgeon: Nigel Mormon, MD;  Location: [facility];  Service: Cardiovascular;  Laterality: N/A;  . CORONARY STENT INTERVENTION N/A 04/07/2017   Procedure: CORONARY STENT INTERVENTION;  Surgeon: Nigel Mormon, MD;  Location: [facility];  Service: Cardiovascular;  Laterality: N/A;  . CYST EXCISION     on Back  . JOINT REPLACEMENT    . LAPAROSCOPIC ABDOMINAL EXPLORATION N/A 11/26/2017   Procedure: LAPAROSCOPIC ABDOMINAL EXPLORATION, DRAINAGE OF APPENDICEAL ABCESS. PLACEMENT OF DRAIN;  Surgeon: Alphonsa Overall, MD;  Location: [facility];  Service: General;  Laterality: N/A;  . RIGHT/LEFT HEART CATH AND CORONARY ANGIOGRAPHY N/A 04/06/2017  Procedure: RIGHT/LEFT HEART CATH AND CORONARY ANGIOGRAPHY;  Surgeon: Nigel Mormon, MD;  Location: [facility];  Service: Cardiovascular;  Laterality: N/A;  . TOTAL HIP ARTHROPLASTY     Right    Social History   Occupational History  . Occupation: Retired  Tobacco Use  . Smoking status: Never Smoker  . Smokeless tobacco: Never Used  Substance and Sexual Activity  . Alcohol use: Not Currently  . Drug use: No  . Sexual activity: Yes

## 2018-06-30 ENCOUNTER — Other Ambulatory Visit: Payer: Self-pay | Admitting: Cardiology

## 2018-06-30 DIAGNOSIS — I10 Essential (primary) hypertension: Secondary | ICD-10-CM

## 2018-06-30 DIAGNOSIS — I2510 Atherosclerotic heart disease of native coronary artery without angina pectoris: Secondary | ICD-10-CM

## 2018-06-30 DIAGNOSIS — R0989 Other specified symptoms and signs involving the circulatory and respiratory systems: Secondary | ICD-10-CM

## 2018-07-01 ENCOUNTER — Encounter (INDEPENDENT_AMBULATORY_CARE_PROVIDER_SITE_OTHER): Payer: Self-pay | Admitting: Physician Assistant

## 2018-07-13 ENCOUNTER — Ambulatory Visit (INDEPENDENT_AMBULATORY_CARE_PROVIDER_SITE_OTHER): Payer: Medicare Other | Admitting: Physician Assistant

## 2018-07-20 ENCOUNTER — Other Ambulatory Visit (HOSPITAL_COMMUNITY): Payer: Self-pay | Admitting: Cardiology

## 2018-07-20 ENCOUNTER — Ambulatory Visit: Payer: Medicare Other

## 2018-07-20 DIAGNOSIS — R0989 Other specified symptoms and signs involving the circulatory and respiratory systems: Secondary | ICD-10-CM | POA: Diagnosis not present

## 2018-07-20 DIAGNOSIS — I10 Essential (primary) hypertension: Secondary | ICD-10-CM | POA: Diagnosis not present

## 2018-07-20 DIAGNOSIS — I2510 Atherosclerotic heart disease of native coronary artery without angina pectoris: Secondary | ICD-10-CM | POA: Diagnosis not present

## 2018-07-20 DIAGNOSIS — I6523 Occlusion and stenosis of bilateral carotid arteries: Secondary | ICD-10-CM

## 2018-07-21 LAB — BASIC METABOLIC PANEL
BUN/Creatinine Ratio: 14 (ref 10–24)
BUN: 22 mg/dL (ref 8–27)
CO2: 22 mmol/L (ref 20–29)
Calcium: 8.8 mg/dL (ref 8.6–10.2)
Chloride: 105 mmol/L (ref 96–106)
Creatinine, Ser: 1.52 mg/dL — ABNORMAL HIGH (ref 0.76–1.27)
GFR calc Af Amer: 53 mL/min/{1.73_m2} — ABNORMAL LOW (ref >59)
GFR calc non Af Amer: 45 mL/min/{1.73_m2} — ABNORMAL LOW (ref >59)
GLUCOSE: 218 mg/dL — AB (ref 65–99)
POTASSIUM: 4.4 mmol/L (ref 3.5–5.2)
Sodium: 140 mmol/L (ref 134–144)

## 2018-07-21 LAB — CBC WITH DIFFERENTIAL/PLATELET
Basophils Absolute: 0 10*3/uL (ref 0.0–0.2)
Basos: 0 %
EOS (ABSOLUTE): 0.1 10*3/uL (ref 0.0–0.4)
Eos: 1 %
HEMOGLOBIN: 10 g/dL — AB (ref 13.0–17.7)
Hematocrit: 30.1 % — ABNORMAL LOW (ref 37.5–51.0)
Immature Grans (Abs): 0 10*3/uL (ref 0.0–0.1)
Immature Granulocytes: 0 %
LYMPHS ABS: 1.4 10*3/uL (ref 0.7–3.1)
Lymphs: 21 %
MCH: 34.8 pg — ABNORMAL HIGH (ref 26.6–33.0)
MCHC: 33.2 g/dL (ref 31.5–35.7)
MCV: 105 fL — ABNORMAL HIGH (ref 79–97)
Monocytes Absolute: 0.8 10*3/uL (ref 0.1–0.9)
Monocytes: 12 %
Neutrophils Absolute: 4.4 10*3/uL (ref 1.4–7.0)
Neutrophils: 66 %
Platelets: 220 10*3/uL (ref 150–450)
RBC: 2.87 x10E6/uL — ABNORMAL LOW (ref 4.14–5.80)
RDW: 11.1 % — ABNORMAL LOW (ref 11.6–15.4)
WBC: 6.6 10*3/uL (ref 3.4–10.8)

## 2018-07-24 ENCOUNTER — Other Ambulatory Visit: Payer: Self-pay | Admitting: Cardiology

## 2018-07-24 ENCOUNTER — Telehealth: Payer: Self-pay | Admitting: Cardiology

## 2018-07-24 DIAGNOSIS — I6523 Occlusion and stenosis of bilateral carotid arteries: Secondary | ICD-10-CM

## 2018-07-24 NOTE — Telephone Encounter (Signed)
Let him know that the left carotid stenosis is around 65-70%, slightly progressed compared to previous, very mild stenosis right, we will recheck in 6 months.

## 2018-07-25 NOTE — Progress Notes (Signed)
Patient is here for follow up visit.  Subjective:   David [name], male    DOB: 1946/04/10, 73 y.o.   MRN: [id number]   Chief Complaint  Patient presents with  . Results    Carotid Dup  . Follow-up    HPI  73 year old African-American male with coronary artery disease status post complex LAD PCI 03/2017, HFpEF, pulmonary hypertension, type II diabetes mellitus, hypertension, CKD III.  Patient is here with his wife. He has bene doing well since his last visit. While he does not do a lot of physical activity, he denies chest pain, shortness of breath, palpitations, orthopnea, PND, TIA/syncope. He has occasional leg edema. He had his left foot 2nd toe amputated in 06/2018 due to osteomyelitis.   His Cr has remarkably improved. He did have AKI, with Cr up to 5 in 11/2017. His most recent Cr is 1.52. He has leg edema has improved, but still persists.   His DM is managed by Dr. Buddy Duty. Unfortunately, he still enjoys his sweet tea.   Past Medical History:  Diagnosis Date  . Arthritis   . CAD in native artery    s/p stent in 11/18  . CHF (congestive heart failure) ([place])    normal echo in 11/18  . CKD (chronic kidney disease)   . DDD (degenerative disc disease), cervical   . Diabetes mellitus with complication (HCC)    Type II  . Diabetic neuropathy ([place])   . Diabetic retinopathy ([place])   . GERD (gastroesophageal reflux disease)   . Glaucoma   . Hypertension      Past Surgical History:  Procedure Laterality Date  . AMPUTATION Right 07/09/2016   Procedure: Right Great Toe Amputation at Metatarsophalangeal Joint;  Surgeon: [name], MD;  Location: [facility];  Service: Orthopedics;  Laterality: Right;  . AMPUTATION Right 05/28/2018   Procedure: RIGHT SECOND TOE AMPUTATION;  Surgeon: [name], MD;  Location: [facility];  Service: Orthopedics;  Laterality: Right;  . BACK SURGERY     4  . CARDIAC CATHETERIZATION    . CORONARY STENT INTERVENTION N/A 04/06/2017   Procedure:  CORONARY STENT INTERVENTION;  Surgeon: Nigel Mormon, MD;  Location: [facility];  Service: Cardiovascular;  Laterality: N/A;  . CORONARY STENT INTERVENTION N/A 04/07/2017   Procedure: CORONARY STENT INTERVENTION;  Surgeon: Nigel Mormon, MD;  Location: [facility];  Service: Cardiovascular;  Laterality: N/A;  . CYST EXCISION     on Back  . JOINT REPLACEMENT    . LAPAROSCOPIC ABDOMINAL EXPLORATION N/A 11/26/2017   Procedure: LAPAROSCOPIC ABDOMINAL EXPLORATION, DRAINAGE OF APPENDICEAL ABCESS. PLACEMENT OF DRAIN;  Surgeon: Alphonsa Overall, MD;  Location: [facility];  Service: General;  Laterality: N/A;  . RIGHT/LEFT HEART CATH AND CORONARY ANGIOGRAPHY N/A 04/06/2017   Procedure: RIGHT/LEFT HEART CATH AND CORONARY ANGIOGRAPHY;  Surgeon: Nigel Mormon, MD;  Location: [facility];  Service: Cardiovascular;  Laterality: N/A;  . TOTAL HIP ARTHROPLASTY     Right      Social History   Socioeconomic History  . Marital status: Married    Spouse name: Not on file  . Number of children: 2  . Years of education: 66  . Highest education level: Master's degree (e.g., MA, MS, MEng, MEd, MSW, MBA)  Occupational History  . Occupation: Retired  Scientific laboratory technician  . Financial resource strain: Not hard at all  . Food insecurity:    Worry: Not on file  Inability: Not on file  . Transportation needs:    Medical: Not on file    Non-medical: Not on file  Tobacco Use  . Smoking status: Never Smoker  . Smokeless tobacco: Never Used  Substance and Sexual Activity  . Alcohol use: Not Currently  . Drug use: No  . Sexual activity: Yes  Lifestyle  . Physical activity:    Days per week: 0 days    Minutes per session: 0 min  . Stress: Not at all  Relationships  . Social connections:    Talks on phone: Twice a week    Gets together: Twice a week    Attends religious service: 1 to 4 times per year    Active member of club or organization: Yes    Attends meetings of clubs  or organizations: 1 to 4 times per year    Relationship status: Married  . Intimate partner violence:    Fear of current or ex partner: No    Emotionally abused: No    Physically abused: No    Forced sexual activity: No  Other Topics Concern  . Not on file  Social History Narrative  . Not on file     Current Outpatient Medications on File Prior to Visit  Medication Sig Dispense Refill  . acetaminophen (TYLENOL) 325 MG tablet Take 2 tablets (650 mg total) by mouth every 6 (six) hours as needed for mild pain or moderate pain (or Fever >/= 101).    Marland Kitchen albuterol (PROVENTIL HFA;VENTOLIN HFA) 108 (90 Base) MCG/ACT inhaler Inhale 2 puffs into the lungs every 6 (six) hours as needed for wheezing or shortness of breath. 1 Inhaler 2  . amLODipine (NORVASC) 10 MG tablet Take 1 tablet (10 mg total) by mouth daily.    Marland Kitchen aspirin EC 81 MG tablet Take 81 mg by mouth daily.    . dorzolamide-timolol (COSOPT) 22.3-6.8 MG/ML ophthalmic solution Place 1 drop into both eyes 2 (two) times daily.  98  . ferrous sulfate 325 (65 FE) MG tablet Take 325 mg by mouth daily with breakfast.    . fluticasone (FLONASE) 50 MCG/ACT nasal spray Place 1 spray into both nostrils daily. 16 g 1  . HUMALOG KWIKPEN 100 UNIT/ML KiwkPen Inject 4 Units into the skin 3 (three) times daily.  4  . HYDROcodone-acetaminophen (NORCO) 10-325 MG tablet Take 1 tablet by mouth every 6 (six) hours as needed for moderate pain.    . hyoscyamine (LEVBID) 0.375 MG 12 hr tablet Take by mouth continuous as needed.    . Insulin Detemir (LEVEMIR FLEXTOUCH) 100 UNIT/ML Pen Inject 20 Units into the skin daily. (Patient taking differently: Inject 32 Units into the skin daily. )    . isosorbide-hydrALAZINE (BIDIL) 20-37.5 MG tablet Take 1 tablet by mouth 3 (three) times daily. (Patient taking differently: Take 2 tablets by mouth 2 (two) times daily. ) 90 tablet 0  . methocarbamol (ROBAXIN) 750 MG tablet Take 1 tablet (750 mg total) by mouth every 6 (six)  hours as needed for muscle spasms. 30 tablet 0  . metoprolol succinate (TOPROL-XL) 100 MG 24 hr tablet TAKE 1 TABLET BY MOUTH EVERY DAY 30 tablet 5  . mupirocin ointment (BACTROBAN) 2 % Apply 1 application topically daily. 22 g 0  . saccharomyces boulardii (FLORASTOR) 250 MG capsule Take 1 capsule (250 mg total) by mouth 2 (two) times daily.    Marland Kitchen torsemide (DEMADEX) 20 MG tablet Take 10 mg by mouth daily.    Marland Kitchen ALPRAZolam (  XANAX) 1 MG tablet Take 1 tablet (1 mg total) by mouth 2 (two) times daily as needed for anxiety. (Patient not taking: Reported on 07/30/2018) 6 tablet 0  . gabapentin (NEURONTIN) 600 MG tablet 2 (two) times daily.     No current facility-administered medications on file prior to visit.     Cardiovascular studies:  EKG 07/30/2018: Sinus rhythm 70 bpm First degree A-V block  Left atrial enlargement.  Nonspecific T-abnormality.   Carotid artery duplex  07/20/2018 Stenosis in the right internal carotid artery (16-49%). Severe stenosis in the left internal carotid artery (50-69%). The left PSV internal/common carotid artery ratio of 4.0 is consistent with a stenosis of >70%. Stenosis in the left external carotid artery (>50%). Antegrade right vertebral artery flow. Antegrade left vertebral artery flow. Compared to 12/29/2017, left ICA stenosis appears to have slightly progressed. Follow up in six months is appropriate if clinically indicated.  Echocardiogram 12/22/2017: Left ventricle: The cavity size was normal. There was moderate   concentric hypertrophy. Systolic function was normal. The   estimated ejection fraction was in the range of 50% to 55%.   Features are consistent with a pseudonormal left ventricular   filling pattern, with concomitant abnormal relaxation and   increased filling pressure (grade 2 diastolic dysfunction).   Doppler parameters are consistent with both elevated ventricular   end-diastolic filling pressure and elevated left atrial filling    pressure. - Ventricular septum: The contour showed diastolic flattening. - Mitral valve: There was mild regurgitation. - Right ventricle: The cavity size was mildly dilated. - Atrial septum: No defect or patent foramen ovale was identified. - Pulmonary arteries: PA peak pressure: 64 mm Hg (S). - Compared to 03/30/2017, pulmonary hypertensin and RV strain is   new.  Cath 04/06/2017: Successful complex PTCA and overlapping stents mid LAD Complexity due to severe calcification and severe tortuosity Resolute 3.0 X 18 mm Synergy 3.0 X 16 mm Residual 10% unde expansion of proximal stent  dLAD 2.75 X 26 mm Resolure DES   Recent labs:  Results for RAIMUNDO, CORBIT (MRN [id number]) as of 07/30/2018 18:03  Ref. Range 07/20/2018 93:90  BASIC METABOLIC PANEL Unknown Rpt (A)  Sodium Latest Ref Range: 134 - 144 mmol/L 140  Potassium Latest Ref Range: 3.5 - 5.2 mmol/L 4.4  Chloride Latest Ref Range: 96 - 106 mmol/L 105  CO2 Latest Ref Range: 20 - 29 mmol/L 22  Glucose Latest Ref Range: 65 - 99 mg/dL 218 (H)  BUN Latest Ref Range: 8 - 27 mg/dL 22  Creatinine Latest Ref Range: 0.76 - 1.27 mg/dL 1.52 (H)  Calcium Latest Ref Range: 8.6 - 10.2 mg/dL 8.8  BUN/Creatinine Ratio Latest Ref Range: 10 - 24  14  GFR, Est Non African American Latest Ref Range: >59 mL/min/1.73 45 (L)  GFR, Est African American Latest Ref Range: >59 mL/min/1.73 53 (L)    Review of Systems  Constitution: Negative for decreased appetite, malaise/fatigue, weight gain and weight loss.  HENT: Negative for congestion.   Eyes: Negative for visual disturbance.  Cardiovascular: Negative for chest pain, dyspnea on exertion, leg swelling, palpitations and syncope.  Respiratory: Negative for shortness of breath.   Endocrine: Negative for cold intolerance.  Hematologic/Lymphatic: Does not bruise/bleed easily.  Skin: Negative for itching and rash.  Musculoskeletal: Negative for myalgias.  Gastrointestinal: Negative for abdominal  pain, nausea and vomiting.  Genitourinary: Negative for dysuria.  Neurological: Negative for dizziness and weakness.  Psychiatric/Behavioral: The patient is not nervous/anxious.   All other  systems reviewed and are negative.      Objective:    Vitals:   07/30/18 1344  BP: (!) 146/64  Pulse: 68  SpO2: 95%     Physical Exam  Constitutional: He is oriented to person, place, and time. He appears well-developed and well-nourished. No distress.  HENT:  Head: Normocephalic and atraumatic.  Eyes: Pupils are equal, round, and reactive to light. Conjunctivae are normal.  Neck: No JVD present.  Cardiovascular: Normal rate, regular rhythm and intact distal pulses.  Pulmonary/Chest: Effort normal and breath sounds normal. He has no wheezes. He has no rales.  Abdominal: Soft. Bowel sounds are normal. There is no rebound.  Musculoskeletal:        General: Deformity (S/p amputation left foot 1st and 2nd toe. Healing well) present. No edema (Swelling in knees, none in legs. Patient is wearing socks up to knees).  Lymphadenopathy:    He has no cervical adenopathy.  Neurological: He is alert and oriented to person, place, and time. No cranial nerve deficit.  Skin: Skin is warm and dry.  Psychiatric: He has a normal mood and affect.  Nursing note and vitals reviewed.       Assessment & Recommendations:   73 year old African-American male with coronary artery disease status post complex LAD PCI 03/2017, HFpEF, pulmonary hypertension, type II diabetes mellitus, hypertension, CKD III.  HFpEF: Stage II-III. Continue torsemide 10 mg PO daily, with additional dose, as necessary. Given his renal function which has significantly improved, I would avoid aggressive diuresis, unless absolutely necessary.   CAD: Stable. No current angina. Continue aspirin, metoprolol succinate. Increase lipitor to 80 mg daily.   Pulmonary hypertension: WHO Grp II, possibly out of proportion. Continue managmenet  with diuretics at this time.  Hypertension: Improved. Continue current therapy.  Moderate carotid stenoses: Asymptomatic. Continue risk factor modification. Repeat carotid ultrasound in 01/2019. Increased lipitor to 80 mg daily. Repea  Follow up in 6 months after lipid panel and carotid US  Fayelynn Distel J Aldeen Riga, MD [facility] Pager: [phone] Office: [phone] If no answer Cell [phone]

## 2018-07-27 NOTE — Telephone Encounter (Signed)
Called Pt and left voicemail for callback.

## 2018-07-30 ENCOUNTER — Encounter: Payer: Self-pay | Admitting: Cardiology

## 2018-07-30 ENCOUNTER — Other Ambulatory Visit: Payer: Self-pay

## 2018-07-30 ENCOUNTER — Ambulatory Visit: Payer: Medicare Other | Admitting: Cardiology

## 2018-07-30 VITALS — BP 146/64 | HR 68 | Ht 74.0 in | Wt 222.3 lb

## 2018-07-30 DIAGNOSIS — I5032 Chronic diastolic (congestive) heart failure: Secondary | ICD-10-CM | POA: Diagnosis not present

## 2018-07-30 DIAGNOSIS — I2510 Atherosclerotic heart disease of native coronary artery without angina pectoris: Secondary | ICD-10-CM | POA: Diagnosis not present

## 2018-07-30 DIAGNOSIS — I10 Essential (primary) hypertension: Secondary | ICD-10-CM | POA: Diagnosis not present

## 2018-07-30 DIAGNOSIS — I6523 Occlusion and stenosis of bilateral carotid arteries: Secondary | ICD-10-CM | POA: Diagnosis not present

## 2018-07-30 MED ORDER — ATORVASTATIN CALCIUM 80 MG PO TABS: 80.0000 mg | ORAL_TABLET | Freq: Every day | ORAL | 3 refills | Status: DC

## 2018-08-05 ENCOUNTER — Inpatient Hospital Stay (HOSPITAL_COMMUNITY)
Admission: EM | Admit: 2018-08-05 | Discharge: 2018-08-15 | DRG: 871 | Disposition: A | Discharge status: Home / Self Care | Payer: Medicare Other | Attending: Internal Medicine | Admitting: Internal Medicine

## 2018-08-05 ENCOUNTER — Emergency Department (HOSPITAL_COMMUNITY): Payer: Medicare Other

## 2018-08-05 ENCOUNTER — Other Ambulatory Visit: Payer: Self-pay

## 2018-08-05 ENCOUNTER — Encounter (HOSPITAL_COMMUNITY): Payer: Self-pay

## 2018-08-05 DIAGNOSIS — L84 Corns and callosities: Secondary | ICD-10-CM | POA: Diagnosis not present

## 2018-08-05 DIAGNOSIS — Z6826 Body mass index (BMI) 26.0-26.9, adult: Secondary | ICD-10-CM

## 2018-08-05 DIAGNOSIS — R0602 Shortness of breath: Secondary | ICD-10-CM

## 2018-08-05 DIAGNOSIS — R945 Abnormal results of liver function studies: Secondary | ICD-10-CM

## 2018-08-05 DIAGNOSIS — E11319 Type 2 diabetes mellitus with unspecified diabetic retinopathy without macular edema: Secondary | ICD-10-CM | POA: Diagnosis present

## 2018-08-05 DIAGNOSIS — J80 Acute respiratory distress syndrome: Secondary | ICD-10-CM

## 2018-08-05 DIAGNOSIS — N1832 Chronic kidney disease, stage 3b: Secondary | ICD-10-CM

## 2018-08-05 DIAGNOSIS — J189 Pneumonia, unspecified organism: Secondary | ICD-10-CM | POA: Diagnosis present

## 2018-08-05 DIAGNOSIS — I21A1 Myocardial infarction type 2: Secondary | ICD-10-CM | POA: Diagnosis not present

## 2018-08-05 DIAGNOSIS — M25431 Effusion, right wrist: Secondary | ICD-10-CM

## 2018-08-05 DIAGNOSIS — J9600 Acute respiratory failure, unspecified whether with hypoxia or hypercapnia: Secondary | ICD-10-CM

## 2018-08-05 DIAGNOSIS — K5909 Other constipation: Secondary | ICD-10-CM | POA: Diagnosis present

## 2018-08-05 DIAGNOSIS — N189 Chronic kidney disease, unspecified: Secondary | ICD-10-CM | POA: Diagnosis not present

## 2018-08-05 DIAGNOSIS — J9621 Acute and chronic respiratory failure with hypoxia: Secondary | ICD-10-CM | POA: Diagnosis not present

## 2018-08-05 DIAGNOSIS — E1122 Type 2 diabetes mellitus with diabetic chronic kidney disease: Secondary | ICD-10-CM | POA: Diagnosis present

## 2018-08-05 DIAGNOSIS — M12531 Traumatic arthropathy, right wrist: Secondary | ICD-10-CM | POA: Diagnosis present

## 2018-08-05 DIAGNOSIS — I272 Pulmonary hypertension, unspecified: Secondary | ICD-10-CM | POA: Diagnosis present

## 2018-08-05 DIAGNOSIS — T4275XA Adverse effect of unspecified antiepileptic and sedative-hypnotic drugs, initial encounter: Secondary | ICD-10-CM | POA: Diagnosis not present

## 2018-08-05 DIAGNOSIS — N184 Chronic kidney disease, stage 4 (severe): Secondary | ICD-10-CM | POA: Diagnosis not present

## 2018-08-05 DIAGNOSIS — R451 Restlessness and agitation: Secondary | ICD-10-CM | POA: Diagnosis not present

## 2018-08-05 DIAGNOSIS — E1169 Type 2 diabetes mellitus with other specified complication: Secondary | ICD-10-CM

## 2018-08-05 DIAGNOSIS — Z20828 Contact with and (suspected) exposure to other viral communicable diseases: Secondary | ICD-10-CM

## 2018-08-05 DIAGNOSIS — Z79891 Long term (current) use of opiate analgesic: Secondary | ICD-10-CM

## 2018-08-05 DIAGNOSIS — I2510 Atherosclerotic heart disease of native coronary artery without angina pectoris: Secondary | ICD-10-CM | POA: Diagnosis present

## 2018-08-05 DIAGNOSIS — E876 Hypokalemia: Secondary | ICD-10-CM | POA: Diagnosis not present

## 2018-08-05 DIAGNOSIS — E1140 Type 2 diabetes mellitus with diabetic neuropathy, unspecified: Secondary | ICD-10-CM | POA: Diagnosis present

## 2018-08-05 DIAGNOSIS — D72829 Elevated white blood cell count, unspecified: Secondary | ICD-10-CM | POA: Diagnosis not present

## 2018-08-05 DIAGNOSIS — J181 Lobar pneumonia, unspecified organism: Secondary | ICD-10-CM

## 2018-08-05 DIAGNOSIS — G92 Toxic encephalopathy: Secondary | ICD-10-CM | POA: Diagnosis not present

## 2018-08-05 DIAGNOSIS — R7989 Other specified abnormal findings of blood chemistry: Secondary | ICD-10-CM

## 2018-08-05 DIAGNOSIS — H409 Unspecified glaucoma: Secondary | ICD-10-CM | POA: Diagnosis present

## 2018-08-05 DIAGNOSIS — Z7951 Long term (current) use of inhaled steroids: Secondary | ICD-10-CM

## 2018-08-05 DIAGNOSIS — Y92230 Patient room in hospital as the place of occurrence of the external cause: Secondary | ICD-10-CM | POA: Diagnosis not present

## 2018-08-05 DIAGNOSIS — R52 Pain, unspecified: Secondary | ICD-10-CM

## 2018-08-05 DIAGNOSIS — Z01818 Encounter for other preprocedural examination: Secondary | ICD-10-CM

## 2018-08-05 DIAGNOSIS — Z7982 Long term (current) use of aspirin: Secondary | ICD-10-CM

## 2018-08-05 DIAGNOSIS — Z820 Family history of epilepsy and other diseases of the nervous system: Secondary | ICD-10-CM

## 2018-08-05 DIAGNOSIS — N179 Acute kidney failure, unspecified: Secondary | ICD-10-CM | POA: Diagnosis present

## 2018-08-05 DIAGNOSIS — N183 Chronic kidney disease, stage 3 (moderate): Secondary | ICD-10-CM | POA: Diagnosis present

## 2018-08-05 DIAGNOSIS — A491 Streptococcal infection, unspecified site: Secondary | ICD-10-CM | POA: Diagnosis not present

## 2018-08-05 DIAGNOSIS — I5032 Chronic diastolic (congestive) heart failure: Secondary | ICD-10-CM | POA: Diagnosis present

## 2018-08-05 DIAGNOSIS — R509 Fever, unspecified: Secondary | ICD-10-CM | POA: Diagnosis present

## 2018-08-05 DIAGNOSIS — J9601 Acute respiratory failure with hypoxia: Secondary | ICD-10-CM | POA: Diagnosis not present

## 2018-08-05 DIAGNOSIS — Z8349 Family history of other endocrine, nutritional and metabolic diseases: Secondary | ICD-10-CM

## 2018-08-05 DIAGNOSIS — J9602 Acute respiratory failure with hypercapnia: Secondary | ICD-10-CM | POA: Diagnosis not present

## 2018-08-05 DIAGNOSIS — R001 Bradycardia, unspecified: Secondary | ICD-10-CM | POA: Diagnosis not present

## 2018-08-05 DIAGNOSIS — M11231 Other chondrocalcinosis, right wrist: Secondary | ICD-10-CM | POA: Diagnosis present

## 2018-08-05 DIAGNOSIS — I130 Hypertensive heart and chronic kidney disease with heart failure and stage 1 through stage 4 chronic kidney disease, or unspecified chronic kidney disease: Secondary | ICD-10-CM | POA: Diagnosis present

## 2018-08-05 DIAGNOSIS — M25531 Pain in right wrist: Secondary | ICD-10-CM | POA: Diagnosis not present

## 2018-08-05 DIAGNOSIS — E119 Type 2 diabetes mellitus without complications: Secondary | ICD-10-CM | POA: Diagnosis not present

## 2018-08-05 DIAGNOSIS — I6523 Occlusion and stenosis of bilateral carotid arteries: Secondary | ICD-10-CM | POA: Diagnosis present

## 2018-08-05 DIAGNOSIS — A408 Other streptococcal sepsis: Principal | ICD-10-CM | POA: Diagnosis present

## 2018-08-05 DIAGNOSIS — Z833 Family history of diabetes mellitus: Secondary | ICD-10-CM

## 2018-08-05 DIAGNOSIS — Z89411 Acquired absence of right great toe: Secondary | ICD-10-CM | POA: Diagnosis not present

## 2018-08-05 DIAGNOSIS — Z789 Other specified health status: Secondary | ICD-10-CM

## 2018-08-05 DIAGNOSIS — Z781 Physical restraint status: Secondary | ICD-10-CM

## 2018-08-05 DIAGNOSIS — Z794 Long term (current) use of insulin: Secondary | ICD-10-CM | POA: Diagnosis not present

## 2018-08-05 DIAGNOSIS — D539 Nutritional anemia, unspecified: Secondary | ICD-10-CM | POA: Diagnosis present

## 2018-08-05 DIAGNOSIS — E663 Overweight: Secondary | ICD-10-CM | POA: Diagnosis present

## 2018-08-05 DIAGNOSIS — Z955 Presence of coronary angioplasty implant and graft: Secondary | ICD-10-CM

## 2018-08-05 DIAGNOSIS — Z8249 Family history of ischemic heart disease and other diseases of the circulatory system: Secondary | ICD-10-CM

## 2018-08-05 DIAGNOSIS — Z4659 Encounter for fitting and adjustment of other gastrointestinal appliance and device: Secondary | ICD-10-CM

## 2018-08-05 DIAGNOSIS — Z823 Family history of stroke: Secondary | ICD-10-CM

## 2018-08-05 DIAGNOSIS — Z5329 Procedure and treatment not carried out because of patient's decision for other reasons: Secondary | ICD-10-CM | POA: Diagnosis not present

## 2018-08-05 LAB — INFLUENZA PANEL BY PCR (TYPE A & B)
Influenza A By PCR: NEGATIVE
Influenza B By PCR: NEGATIVE

## 2018-08-05 LAB — URINALYSIS, ROUTINE W REFLEX MICROSCOPIC
Bacteria, UA: NONE SEEN
Bilirubin Urine: NEGATIVE
Glucose, UA: NEGATIVE mg/dL
Ketones, ur: NEGATIVE mg/dL
Leukocytes,Ua: NEGATIVE
Nitrite: NEGATIVE
PROTEIN: 100 mg/dL — AB
Specific Gravity, Urine: 1.019 (ref 1.005–1.030)
pH: 5 (ref 5.0–8.0)

## 2018-08-05 LAB — CBC WITH DIFFERENTIAL/PLATELET
Basophils Absolute: 0 10*3/uL (ref 0.0–0.1)
Basophils Relative: 0 %
EOS PCT: 1 %
Eosinophils Absolute: 0.1 10*3/uL (ref 0.0–0.5)
HCT: 31.1 % — ABNORMAL LOW (ref 39.0–52.0)
Hemoglobin: 9.5 g/dL — ABNORMAL LOW (ref 13.0–17.0)
LYMPHS PCT: 4 %
Lymphs Abs: 0.5 10*3/uL — ABNORMAL LOW (ref 0.7–4.0)
MCH: 34.4 pg — ABNORMAL HIGH (ref 26.0–34.0)
MCHC: 30.5 g/dL (ref 30.0–36.0)
MCV: 112.7 fL — ABNORMAL HIGH (ref 80.0–100.0)
Monocytes Absolute: 0.1 10*3/uL (ref 0.1–1.0)
Monocytes Relative: 1 %
Neutro Abs: 11.1 10*3/uL — ABNORMAL HIGH (ref 1.7–7.7)
Neutrophils Relative %: 94 %
Platelets: 231 10*3/uL (ref 150–400)
RBC: 2.76 MIL/uL — ABNORMAL LOW (ref 4.22–5.81)
RDW: 11.5 % (ref 11.5–15.5)
WBC Morphology: INCREASED
WBC: 11.9 10*3/uL — ABNORMAL HIGH (ref 4.0–10.5)
nRBC: 0 % (ref 0.0–0.2)

## 2018-08-05 LAB — COMPREHENSIVE METABOLIC PANEL
ALT: 18 U/L (ref 0–44)
AST: 29 U/L (ref 15–41)
Albumin: 2.8 g/dL — ABNORMAL LOW (ref 3.5–5.0)
Alkaline Phosphatase: 147 U/L — ABNORMAL HIGH (ref 38–126)
Anion gap: 12 (ref 5–15)
BUN: 24 mg/dL — ABNORMAL HIGH (ref 8–23)
CHLORIDE: 105 mmol/L (ref 98–111)
CO2: 19 mmol/L — ABNORMAL LOW (ref 22–32)
Calcium: 8.1 mg/dL — ABNORMAL LOW (ref 8.9–10.3)
Creatinine, Ser: 2.55 mg/dL — ABNORMAL HIGH (ref 0.61–1.24)
GFR calc Af Amer: 28 mL/min — ABNORMAL LOW (ref >60)
GFR calc non Af Amer: 24 mL/min — ABNORMAL LOW (ref >60)
Glucose, Bld: 193 mg/dL — ABNORMAL HIGH (ref 70–99)
Potassium: 4 mmol/L (ref 3.5–5.1)
Sodium: 136 mmol/L (ref 135–145)
Total Bilirubin: 1.2 mg/dL (ref 0.3–1.2)
Total Protein: 6.8 g/dL (ref 6.5–8.1)

## 2018-08-05 LAB — STREP PNEUMONIAE URINARY ANTIGEN: Strep Pneumo Urinary Antigen: NEGATIVE

## 2018-08-05 LAB — GLUCOSE, CAPILLARY
Glucose-Capillary: 231 mg/dL — ABNORMAL HIGH (ref 70–99)
Glucose-Capillary: 172 mg/dL — ABNORMAL HIGH (ref 70–99)
Glucose-Capillary: 162 mg/dL — ABNORMAL HIGH (ref 70–99)

## 2018-08-05 LAB — PROCALCITONIN: Procalcitonin: 1.28 ng/mL

## 2018-08-05 LAB — LACTIC ACID, PLASMA: LACTIC ACID, VENOUS: 1.9 mmol/L (ref 0.5–1.9)

## 2018-08-05 MED ORDER — SODIUM CHLORIDE 0.9 % IV SOLN
2.0000 g | INTRAVENOUS | Status: DC
  Administered 2018-08-05 – 2018-08-06 (×2): 2 g via INTRAVENOUS
  Filled 2018-08-05 (×3): qty 20

## 2018-08-05 MED ORDER — SODIUM CHLORIDE 0.9 % IV SOLN
INTRAVENOUS | Status: DC
  Administered 2018-08-05 – 2018-08-07 (×4): via INTRAVENOUS

## 2018-08-05 MED ORDER — TORSEMIDE 20 MG PO TABS
10.0000 mg | ORAL_TABLET | Freq: Every day | ORAL | Status: DC
  Administered 2018-08-05 – 2018-08-06 (×2): 10 mg via ORAL
  Filled 2018-08-05 (×2): qty 1

## 2018-08-05 MED ORDER — INSULIN ASPART 100 UNIT/ML ~~LOC~~ SOLN: 0.0000 [IU] | Freq: Every day | SUBCUTANEOUS | Status: DC

## 2018-08-05 MED ORDER — ALBUTEROL SULFATE HFA 108 (90 BASE) MCG/ACT IN AERS
2.0000 | INHALATION_SPRAY | Freq: Four times a day (QID) | RESPIRATORY_TRACT | Status: DC | PRN
  Filled 2018-08-05: qty 6.7

## 2018-08-05 MED ORDER — ACETAMINOPHEN 325 MG PO TABS: 650.0000 mg | ORAL_TABLET | Freq: Four times a day (QID) | ORAL | Status: DC | PRN

## 2018-08-05 MED ORDER — ENSURE ENLIVE PO LIQD
237.0000 mL | Freq: Two times a day (BID) | ORAL | Status: DC
  Administered 2018-08-05: 237 mL via ORAL

## 2018-08-05 MED ORDER — ACETAMINOPHEN 650 MG RE SUPP: 650.0000 mg | Freq: Four times a day (QID) | RECTAL | Status: DC | PRN

## 2018-08-05 MED ORDER — HYOSCYAMINE SULFATE 0.125 MG SL SUBL
0.2500 mg | SUBLINGUAL_TABLET | Freq: Once | SUBLINGUAL | Status: AC
  Administered 2018-08-05: 0.25 mg via ORAL
  Filled 2018-08-05: qty 2

## 2018-08-05 MED ORDER — VANCOMYCIN HCL 10 G IV SOLR: 1750.0000 mg | INTRAVENOUS | Status: DC

## 2018-08-05 MED ORDER — SODIUM CHLORIDE 0.9 % IV BOLUS (SEPSIS)
1000.0000 mL | Freq: Once | INTRAVENOUS | Status: AC
  Administered 2018-08-05: 1000 mL via INTRAVENOUS

## 2018-08-05 MED ORDER — SODIUM CHLORIDE 0.9 % IV SOLN
2.0000 g | Freq: Once | INTRAVENOUS | Status: AC
  Administered 2018-08-05: 2 g via INTRAVENOUS
  Filled 2018-08-05: qty 2

## 2018-08-05 MED ORDER — METRONIDAZOLE IN NACL 5-0.79 MG/ML-% IV SOLN
500.0000 mg | Freq: Once | INTRAVENOUS | Status: AC
  Administered 2018-08-05: 500 mg via INTRAVENOUS
  Filled 2018-08-05: qty 100

## 2018-08-05 MED ORDER — ONDANSETRON HCL 4 MG/2ML IJ SOLN: 4.0000 mg | Freq: Four times a day (QID) | INTRAMUSCULAR | Status: DC | PRN

## 2018-08-05 MED ORDER — ACETAMINOPHEN 500 MG PO TABS
500.0000 mg | ORAL_TABLET | Freq: Once | ORAL | Status: AC
  Administered 2018-08-05: 500 mg via ORAL
  Filled 2018-08-05: qty 1

## 2018-08-05 MED ORDER — VANCOMYCIN HCL 10 G IV SOLR
2000.0000 mg | Freq: Once | INTRAVENOUS | Status: AC
  Administered 2018-08-05: 2000 mg via INTRAVENOUS
  Filled 2018-08-05: qty 2000

## 2018-08-05 MED ORDER — VANCOMYCIN HCL IN DEXTROSE 1-5 GM/200ML-% IV SOLN: 1000.0000 mg | Freq: Once | INTRAVENOUS | Status: DC

## 2018-08-05 MED ORDER — ATORVASTATIN CALCIUM 80 MG PO TABS
80.0000 mg | ORAL_TABLET | Freq: Every day | ORAL | Status: DC
  Administered 2018-08-05 – 2018-08-09 (×5): 80 mg via ORAL
  Filled 2018-08-05 (×6): qty 1

## 2018-08-05 MED ORDER — ASPIRIN EC 81 MG PO TBEC
81.0000 mg | DELAYED_RELEASE_TABLET | Freq: Every day | ORAL | Status: DC
  Administered 2018-08-05 – 2018-08-07 (×3): 81 mg via ORAL
  Filled 2018-08-05 (×3): qty 1

## 2018-08-05 MED ORDER — SODIUM CHLORIDE 0.9 % IV SOLN
INTRAVENOUS | Status: AC
  Administered 2018-08-05 (×2): via INTRAVENOUS

## 2018-08-05 MED ORDER — INSULIN ASPART 100 UNIT/ML ~~LOC~~ SOLN
0.0000 [IU] | Freq: Three times a day (TID) | SUBCUTANEOUS | Status: DC
  Administered 2018-08-05: 3 [IU] via SUBCUTANEOUS
  Administered 2018-08-05: 2 [IU] via SUBCUTANEOUS
  Administered 2018-08-06: 1 [IU] via SUBCUTANEOUS
  Administered 2018-08-07 (×2): 2 [IU] via SUBCUTANEOUS

## 2018-08-05 MED ORDER — ALPRAZOLAM 0.5 MG PO TABS
0.5000 mg | ORAL_TABLET | Freq: Two times a day (BID) | ORAL | Status: DC | PRN
  Administered 2018-08-05 – 2018-08-10 (×5): 0.5 mg via ORAL
  Filled 2018-08-05: qty 1
  Filled 2018-08-05: qty 2
  Filled 2018-08-05: qty 1
  Filled 2018-08-05 (×2): qty 2
  Filled 2018-08-05: qty 1

## 2018-08-05 MED ORDER — METOPROLOL SUCCINATE ER 100 MG PO TB24
100.0000 mg | ORAL_TABLET | Freq: Every day | ORAL | Status: DC
  Administered 2018-08-05 – 2018-08-07 (×3): 100 mg via ORAL
  Filled 2018-08-05 (×4): qty 1

## 2018-08-05 MED ORDER — AZITHROMYCIN 500 MG PO TABS
500.0000 mg | ORAL_TABLET | ORAL | Status: DC
  Administered 2018-08-05 – 2018-08-06 (×2): 500 mg via ORAL
  Filled 2018-08-05: qty 2
  Filled 2018-08-05: qty 1
  Filled 2018-08-05: qty 2

## 2018-08-05 MED ORDER — HYDROCODONE-ACETAMINOPHEN 10-325 MG PO TABS
1.0000 | ORAL_TABLET | Freq: Four times a day (QID) | ORAL | Status: DC | PRN
  Administered 2018-08-05 – 2018-08-15 (×14): 1 via ORAL
  Filled 2018-08-05 (×14): qty 1

## 2018-08-05 MED ORDER — INSULIN DETEMIR 100 UNIT/ML ~~LOC~~ SOLN
10.0000 [IU] | Freq: Every day | SUBCUTANEOUS | Status: DC
  Administered 2018-08-05 – 2018-08-06 (×2): 10 [IU] via SUBCUTANEOUS
  Filled 2018-08-05 (×3): qty 0.1

## 2018-08-05 MED ORDER — DORZOLAMIDE HCL-TIMOLOL MAL 2-0.5 % OP SOLN
1.0000 [drp] | Freq: Two times a day (BID) | OPHTHALMIC | Status: DC
  Administered 2018-08-05 – 2018-08-14 (×17): 1 [drp] via OPHTHALMIC
  Filled 2018-08-05 (×3): qty 10

## 2018-08-05 MED ORDER — SODIUM CHLORIDE 0.9% FLUSH
3.0000 mL | Freq: Two times a day (BID) | INTRAVENOUS | Status: DC
  Administered 2018-08-05 – 2018-08-15 (×17): 3 mL via INTRAVENOUS

## 2018-08-05 MED ORDER — ONDANSETRON HCL 4 MG PO TABS: 4.0000 mg | ORAL_TABLET | Freq: Four times a day (QID) | ORAL | Status: DC | PRN

## 2018-08-05 MED ORDER — SODIUM CHLORIDE 0.9 % IV SOLN: 2.0000 g | Freq: Two times a day (BID) | INTRAVENOUS | Status: DC

## 2018-08-05 NOTE — ED Provider Notes (Addendum)
TIME SEEN: 4:08 AM  CHIEF COMPLAINT: Fever, chills, generalized weakness  HPI: Patient is a 73 year old male with history of hypertension, diabetes, chronic kidney disease, CAD status post stent, CHF who presents to the emergency department with complaints of fevers, chills, generalized weakness.  Denies headache, chest pain, abdominal pain.  No cough, shortness of breath, vomiting, diarrhea.  No rash.  No known sick contacts or recent travel.  Did have a flu vaccination this year.  ROS: See HPI Constitutional:  fever  Eyes: no drainage  ENT: no runny nose   Cardiovascular:  no chest pain  Resp: no SOB  GI: no vomiting GU: no dysuria Integumentary: no rash  Allergy: no hives  Musculoskeletal: no leg swelling  Neurological: no slurred speech ROS otherwise negative  PAST MEDICAL HISTORY/PAST SURGICAL HISTORY:  Past Medical History:  Diagnosis Date  . Arthritis   . CAD in native artery    s/p stent in 11/18  . CHF (congestive heart failure) ([place])    normal echo in 11/18  . CKD (chronic kidney disease)   . DDD (degenerative disc disease), cervical   . Diabetes mellitus with complication (HCC)    Type II  . Diabetic neuropathy ([place])   . Diabetic retinopathy ([place])   . GERD (gastroesophageal reflux disease)   . Glaucoma   . Hypertension     MEDICATIONS:  Prior to Admission medications   Medication Sig Start Date End Date Taking? Authorizing Provider  acetaminophen (TYLENOL) 325 MG tablet Take 2 tablets (650 mg total) by mouth every 6 (six) hours as needed for mild pain or moderate pain (or Fever >/= 101). 12/09/17   Patrecia Pour, MD  albuterol (PROVENTIL HFA;VENTOLIN HFA) 108 (90 Base) MCG/ACT inhaler Inhale 2 puffs into the lungs every 6 (six) hours as needed for wheezing or shortness of breath. 04/09/17   Barton Dubois, MD  ALPRAZolam Duanne Moron) 1 MG tablet Take 1 tablet (1 mg total) by mouth 2 (two) times daily as needed for anxiety. Patient not taking: Reported on 07/30/2018  12/09/17   Patrecia Pour, MD  amLODipine (NORVASC) 10 MG tablet Take 1 tablet (10 mg total) by mouth daily. 12/09/17   Patrecia Pour, MD  aspirin EC 81 MG tablet Take 81 mg by mouth daily.    [provider]  atorvastatin (LIPITOR) 80 MG tablet Take 1 tablet (80 mg total) by mouth at bedtime. 07/30/18   Patwardhan, Reynold Bowen, MD  dorzolamide-timolol (COSOPT) 22.3-6.8 MG/ML ophthalmic solution Place 1 drop into both eyes 2 (two) times daily. 11/17/17   [provider]  ferrous sulfate 325 (65 FE) MG tablet Take 325 mg by mouth daily with breakfast.    [provider]  fluticasone (FLONASE) 50 MCG/ACT nasal spray Place 1 spray into both nostrils daily. 04/09/17   Barton Dubois, MD  gabapentin (NEURONTIN) 600 MG tablet 2 (two) times daily. 07/29/18   [provider]  HUMALOG KWIKPEN 100 UNIT/ML KiwkPen Inject 4 Units into the skin 3 (three) times daily. 08/18/17   [provider]  HYDROcodone-acetaminophen (NORCO) 10-325 MG tablet Take 1 tablet by mouth every 6 (six) hours as needed for moderate pain.    [provider]  hyoscyamine (LEVBID) 0.375 MG 12 hr tablet Take by mouth continuous as needed. 05/04/18   [provider]  Insulin Detemir (LEVEMIR FLEXTOUCH) 100 UNIT/ML Pen Inject 20 Units into the skin daily. Patient taking differently: Inject 32 Units into the skin daily.  12/09/17   Vance Gather  B, MD  isosorbide-hydrALAZINE (BIDIL) 20-37.5 MG tablet Take 1 tablet by mouth 3 (three) times daily. Patient taking differently: Take 2 tablets by mouth 2 (two) times daily.  09/08/16   Ghimire, Henreitta Leber, MD  methocarbamol (ROBAXIN) 750 MG tablet Take 1 tablet (750 mg total) by mouth every 6 (six) hours as needed for muscle spasms. 09/08/16   Ghimire, Henreitta Leber, MD  metoprolol succinate (TOPROL-XL) 100 MG 24 hr tablet TAKE 1 TABLET BY MOUTH EVERY DAY 06/24/18   Miquel Dunn, NP  mupirocin ointment (BACTROBAN) 2 % Apply 1 application topically  daily. 06/08/18   Rayburn, Neta Mends, PA-C  saccharomyces boulardii (FLORASTOR) 250 MG capsule Take 1 capsule (250 mg total) by mouth 2 (two) times daily. 12/09/17   Patrecia Pour, MD  torsemide (DEMADEX) 20 MG tablet Take 10 mg by mouth daily.    [provider]    ALLERGIES:  No Known Allergies  SOCIAL HISTORY:  Social History   Tobacco Use  . Smoking status: Never Smoker  . Smokeless tobacco: Never Used  Substance Use Topics  . Alcohol use: Not Currently    FAMILY HISTORY: Family History  Problem Relation Age of Onset  . Diabetes Other   . Hyperlipidemia Other   . Hypertension Other   . Stroke Other   . Alzheimer's disease Other   . Thyroid disease Mother   . Diabetes Mellitus II Father   . Alzheimer's disease Father     EXAM: BP (!) 160/65   Pulse (!) 106   Temp (!) 104.9 F (40.5 C) (Rectal)   Resp 17   Ht 6\' 2"  (1.88 m)   Wt 99.8 kg   SpO2 96%   BMI 28.25 kg/m  CONSTITUTIONAL: Alert and oriented and responds appropriately to questions.  Elderly, febrile, nontoxic-appearing HEAD: Normocephalic EYES: Conjunctivae clear, pupils appear equal, EOMI ENT: normal nose; moist mucous membranes; No pharyngeal erythema or petechiae, no tonsillar hypertrophy or exudate, no uvular deviation, no unilateral swelling, no trismus or drooling, no muffled voice, normal phonation, no stridor, no dental caries present, no drainable dental abscess noted, no Ludwig's angina, tongue sits flat in the bottom of the mouth, no angioedema, no facial erythema or warmth, no facial swelling; no pain with movement of the neck. NECK: Supple, no meningismus, no nuchal rigidity, no LAD  CARD: Regular and tachycardic; S1 and S2 appreciated; no murmurs, no clicks, no rubs, no gallops RESP: Normal chest excursion without splinting or tachypnea; breath sounds clear and equal bilaterally; no wheezes, no rhonchi, no rales, no hypoxia or respiratory distress, speaking full sentences ABD/GI:  Normal bowel sounds; non-distended; soft, non-tender, no rebound, no guarding, no peritoneal signs, no hepatosplenomegaly BACK:  The back appears normal and is non-tender to palpation, there is no CVA tenderness EXT: Normal ROM in all joints; non-tender to palpation; no edema; normal capillary refill; no cyanosis, no calf tenderness or swelling    SKIN: Normal color for age and race; warm; no rash NEURO: Moves all extremities equally PSYCH: The patient's mood and manner are appropriate. Grooming and personal hygiene are appropriate.  MEDICAL DECISION MAKING: Patient here with fever of unknown source.  Fever as high as 104.9.  Given 1 g of Tylenol in route with EMS.  Tachycardic but otherwise hemodynamically stable.  Will check labs, cultures, urine, chest x-ray, flu swab.  No sick contacts or recent travel.  He meets sirs criteria at this time.  We will start broad-spectrum antibiotics.  Will begin hydrating patient  given history of CHF, I do not think at this time he needs 30 mL/kg IV fluid bolus given he is not hypotensive.  ED PROGRESS: Patient has normal lactate.  Chest x-ray shows bilateral pneumonia.  Flu swab pending.  Will discuss with medicine for admission.  PCP is Dr. Delfina Redwood.   5:55 AM Discussed patient's case with hospitalist, Dr. Alcario Drought.  I have recommended admission and patient (and family if present) agree with this plan. Admitting physician will place admission orders.   I reviewed all nursing notes, vitals, pertinent previous records, EKGs, lab and urine results, imaging (as available).    6:10 AM  Spoke with Maudie Mercury in lab.  At this time there is no one present in micro to run influenza testing.  Someone should be here this morning.    EKG Interpretation  Date/Time:  Thursday August 05 2018 03:37:09 EDT Ventricular Rate:  111 PR Interval:    QRS Duration: 85 QT Interval:  303 QTC Calculation: 412 R Axis:   49 Text Interpretation:  Sinus tachycardia Abnormal R-wave  progression, early transition Repol abnrm, severe global ischemia (LM/MVD) that is new and may be rate related Baseline wander in lead(s) V4 Confirmed by Pryor Curia [phone]) on 08/05/2018 3:47:15 AM        CRITICAL CARE Performed by: Cyril Mourning December Hedtke   Total critical care time: 55 minutes  Critical care time was exclusive of separately billable procedures and treating other patients.  Critical care was necessary to treat or prevent imminent or life-threatening deterioration.  Critical care was time spent personally by me on the following activities: development of treatment plan with patient and/or surrogate as well as nursing, discussions with consultants, evaluation of patient's response to treatment, examination of patient, obtaining history from patient or surrogate, ordering and performing treatments and interventions, ordering and review of laboratory studies, ordering and review of radiographic studies, pulse oximetry and re-evaluation of patient's condition.    Garrie Elenes, Delice Bison, DO 08/05/18 [place], Delice Bison, DO 08/05/18 [phone]

## 2018-08-05 NOTE — ED Notes (Signed)
ED TO INPATIENT HANDOFF REPORT  ED Nurse Name and Phone #: [phone]  S Name/Age/Gender David [name] 73 y.o. male Room/Bed: 024C/024C  Code Status   Code Status: Prior  Home/SNF/Other Home Patient oriented to: self, place, time and situation Is this baseline? Yes   Triage Complete: Triage complete  Chief Complaint sepsis  Triage Note Patient reports fever of 101 at home since this evening and generalized weakness since yesterday. Denies chest pain, SOB, nausea, vomiting, and diarrhea. Given 1000mg  Tylenol by EMS.    Allergies No Known Allergies  Level of Care/Admitting Diagnosis ED Disposition    ED Disposition Condition [facility]: [place] [[id number]]  Level of Care: Med-Surg [16]  Diagnosis: Pneumonia [967893]  Admitting Physician: [place], [place]  Attending Physician: Samuella Cota [[id number]]  Estimated length of stay: past midnight tomorrow  Certification:: I certify this patient will need inpatient services for at least 2 midnights  Bed request comments: COVID rule out  PT Class (Do Not Modify): Inpatient [101]  PT Acc Code (Do Not Modify): Private [1]       B Medical/Surgery History Past Medical History:  Diagnosis Date  . Arthritis   . CAD in native artery    s/p stent in 11/18  . CHF (congestive heart failure) ([place])    normal echo in 11/18  . CKD (chronic kidney disease)   . DDD (degenerative disc disease), cervical   . Diabetes mellitus with complication (HCC)    Type II  . Diabetic neuropathy ([place])   . Diabetic retinopathy ([place])   . GERD (gastroesophageal reflux disease)   . Glaucoma   . Hypertension    Past Surgical History:  Procedure Laterality Date  . AMPUTATION Right 07/09/2016   Procedure: Right Great Toe Amputation at Metatarsophalangeal Joint;  Surgeon: [name], MD;  Location: [facility];  Service: Orthopedics;  Laterality: Right;  . AMPUTATION Right 05/28/2018   Procedure: RIGHT  SECOND TOE AMPUTATION;  Surgeon: [name], MD;  Location: [facility];  Service: Orthopedics;  Laterality: Right;  . BACK SURGERY     4  . CARDIAC CATHETERIZATION    . CORONARY STENT INTERVENTION N/A 04/06/2017   Procedure: CORONARY STENT INTERVENTION;  Surgeon: Nigel Mormon, MD;  Location: [facility];  Service: Cardiovascular;  Laterality: N/A;  . CORONARY STENT INTERVENTION N/A 04/07/2017   Procedure: CORONARY STENT INTERVENTION;  Surgeon: Nigel Mormon, MD;  Location: [facility];  Service: Cardiovascular;  Laterality: N/A;  . CYST EXCISION     on Back  . JOINT REPLACEMENT    . LAPAROSCOPIC ABDOMINAL EXPLORATION N/A 11/26/2017   Procedure: LAPAROSCOPIC ABDOMINAL EXPLORATION, DRAINAGE OF APPENDICEAL ABCESS. PLACEMENT OF DRAIN;  Surgeon: Alphonsa Overall, MD;  Location: [facility];  Service: General;  Laterality: N/A;  . RIGHT/LEFT HEART CATH AND CORONARY ANGIOGRAPHY N/A 04/06/2017   Procedure: RIGHT/LEFT HEART CATH AND CORONARY ANGIOGRAPHY;  Surgeon: Nigel Mormon, MD;  Location: [facility];  Service: Cardiovascular;  Laterality: N/A;  . TOE SURGERY  05/2018   Left   . TOTAL HIP ARTHROPLASTY     Right      A IV Location/Drains/Wounds Patient Lines/Drains/Airways Status   Active Line/Drains/Airways    Name:   Placement date:   Placement time:   Site:   Days:   Peripheral IV 08/05/18 Left Antecubital   08/05/18    0337    Antecubital   less than  1   Peripheral IV 08/05/18 Right Antecubital   08/05/18    0415    Antecubital   less than 1   Airway   05/28/18    0950     69   Incision (Closed) 11/26/17 Abdomen Other (Comment)   11/26/17    1749     252   Incision (Closed) 05/28/18 Foot   05/28/18    1006     69   Incision - 4 Ports Abdomen 1: Umbilicus 2: Left;Lower 3: Left;Upper 4: Right;Upper   11/26/17    1728     252   Wound / Incision (Open or Dehisced) 12/19/17 Other (Comment) Pretibial Right unapproximated edges, blister popped    12/19/17    -     Pretibial   229          Intake/Output Last 24 hours No intake or output data in the 24 hours ending 08/05/18 1047  Labs/Imaging Results for orders placed or performed during the hospital encounter of 08/05/18 (from the past 48 hour(s))  Lactic acid, plasma     Status: None   Collection Time: 08/05/18  4:09 AM  Result Value Ref Range   Lactic Acid, Venous 1.9 0.5 - 1.9 mmol/L    Comment: Performed at [facility], [place] [address], [place], [place] [address]  Comprehensive metabolic panel     Status: Abnormal   Collection Time: 08/05/18  4:09 AM  Result Value Ref Range   Sodium 136 135 - 145 mmol/L   Potassium 4.0 3.5 - 5.1 mmol/L   Chloride 105 98 - 111 mmol/L   CO2 19 (L) 22 - 32 mmol/L   Glucose, Bld 193 (H) 70 - 99 mg/dL   BUN 24 (H) 8 - 23 mg/dL   Creatinine, Ser 2.55 (H) 0.61 - 1.24 mg/dL   Calcium 8.1 (L) 8.9 - 10.3 mg/dL   Total Protein 6.8 6.5 - 8.1 g/dL   Albumin 2.8 (L) 3.5 - 5.0 g/dL   AST 29 15 - 41 U/L   ALT 18 0 - 44 U/L   Alkaline Phosphatase 147 (H) 38 - 126 U/L   Total Bilirubin 1.2 0.3 - 1.2 mg/dL   GFR calc non Af Amer 24 (L) >60 mL/min   GFR calc Af Amer 28 (L) >60 mL/min   Anion gap 12 5 - 15    Comment: Performed at [facility], [place] [address], [place],  [address]  CBC WITH DIFFERENTIAL     Status: Abnormal   Collection Time: 08/05/18  4:09 AM  Result Value Ref Range   WBC 11.9 (H) 4.0 - 10.5 K/uL   RBC 2.76 (L) 4.22 - 5.81 MIL/uL   Hemoglobin 9.5 (L) 13.0 - 17.0 g/dL   HCT 31.1 (L) 39.0 - 52.0 %   MCV 112.7 (H) 80.0 - 100.0 fL   MCH 34.4 (H) 26.0 - 34.0 pg   MCHC 30.5 30.0 - 36.0 g/dL   RDW 11.5 11.5 - 15.5 %   Platelets 231 150 - 400 K/uL   nRBC 0.0 0.0 - 0.2 %   Neutrophils Relative % 94 %   Neutro Abs 11.1 (H) 1.7 - 7.7 K/uL   Lymphocytes Relative 4 %   Lymphs Abs 0.5 (L) 0.7 - 4.0 K/uL   Monocytes Relative 1 %   Monocytes Absolute 0.1 0.1 - 1.0 K/uL   Eosinophils Relative 1 %   Eosinophils Absolute 0.1 0.0 - 0.5  K/uL   Basophils  Relative 0 %   Basophils Absolute 0.0 0.0 - 0.1 K/uL   WBC Morphology INCREASED BANDS (>20% BANDS)     Comment: MILD LEFT SHIFT (1-5% METAS, OCC MYELO, OCC BANDS) TOXIC GRANULATION Performed at [facility] [address] [place], [place] [address]   Procalcitonin     Status: None   Collection Time: 08/05/18  4:09 AM  Result Value Ref Range   Procalcitonin 1.28 ng/mL    Comment:        Interpretation: PCT > 0.5 ng/mL and <= 2 ng/mL: Systemic infection (sepsis) is possible, but other conditions are known to elevate PCT as well. (NOTE)       Sepsis PCT Algorithm           Lower Respiratory Tract                                      Infection PCT Algorithm    ----------------------------     ----------------------------         PCT < 0.25 ng/mL                PCT < 0.10 ng/mL         Strongly encourage             Strongly discourage   discontinuation of antibiotics    initiation of antibiotics    ----------------------------     -----------------------------       PCT 0.25 - 0.50 ng/mL            PCT 0.10 - 0.25 ng/mL               OR       >80% decrease in PCT            Discourage initiation of                                            antibiotics      Encourage discontinuation           of antibiotics    ----------------------------     -----------------------------         PCT >= 0.50 ng/mL              PCT 0.26 - 0.50 ng/mL                AND       <80% decrease in PCT             Encourage initiation of                                             antibiotics       Encourage continuation           of antibiotics    ----------------------------     -----------------------------        PCT >= 0.50 ng/mL                  PCT > 0.50 ng/mL               AND         increase in PCT  Strongly encourage                                      initiation of antibiotics    Strongly encourage escalation           of antibiotics                                      -----------------------------                                           PCT <= 0.25 ng/mL                                                 OR                                        > 80% decrease in PCT                                     Discontinue / Do not initiate                                             antibiotics Performed at [facility], [place] [address], [place], [place] [address]   Urinalysis, Routine w reflex microscopic     Status: Abnormal   Collection Time: 08/05/18  4:19 AM  Result Value Ref Range   Color, Urine AMBER (A) YELLOW    Comment: BIOCHEMICALS MAY BE AFFECTED BY COLOR   [place] HAZY (A) CLEAR   Specific Gravity, Urine 1.019 1.005 - 1.030   pH 5.0 5.0 - 8.0   Glucose, UA NEGATIVE NEGATIVE mg/dL   Hgb urine dipstick MODERATE (A) NEGATIVE   Bilirubin Urine NEGATIVE NEGATIVE   Ketones, ur NEGATIVE NEGATIVE mg/dL   Protein, ur 100 (A) NEGATIVE mg/dL   Nitrite NEGATIVE NEGATIVE   Leukocytes,Ua NEGATIVE NEGATIVE   RBC / HPF 21-50 0 - 5 RBC/hpf   WBC, UA 0-5 0 - 5 WBC/hpf   Bacteria, UA NONE SEEN NONE SEEN   Squamous Epithelial / LPF 0-5 0 - 5   Mucus PRESENT    Hyaline Casts, UA PRESENT     Comment: Performed at [facility], [address], [place], [place] [address]  Influenza panel by PCR (type A & B)     Status: None   Collection Time: 08/05/18  4:20 AM  Result Value Ref Range   Influenza A By PCR NEGATIVE NEGATIVE   Influenza B By PCR NEGATIVE NEGATIVE    Comment: (NOTE) The Xpert Xpress Flu assay is intended as an aid in the diagnosis of  influenza and should not be used as a sole basis for treatment.  This  assay is FDA approved for nasopharyngeal swab specimens  only. Nasal  washings and aspirates are unacceptable for Xpert Xpress Flu testing. Performed at [facility], [place] [address], [place],  [address]    Dg Chest Port 1 View  Result Date: 08/05/2018 CLINICAL DATA:  Fever EXAM: PORTABLE  CHEST 1 VIEW COMPARISON:  01/15/2018 FINDINGS: Cardiomegaly with coronary stenting. Low volume chest. Fine interstitial opacity on both sides without Kerley lines, vascular pedicle widening, or effusion. No pneumothorax IMPRESSION: 1. Low volume chest with bilateral fine interstitial opacity. History of fever suggest bronchitis or interstitial pneumonia. 2. Chronic cardiomegaly. Electronically Signed   By: Monte Fantasia M.D.   On: 08/05/2018 04:37    Pending Labs Unresulted Labs (From admission, onward)    Start     Ordered   08/05/18 1037  Novel Coronavirus, NAA (hospital order; send-out to ref lab)  (Novel Coronavirus, NAA [facility] Order; send-out to ref lab) with precautions panel)  Add-on,   R    Question Answer Comment  Approved by (Infection Prevention staff member's name): non needed   Patient immune status Normal      08/05/18 1037   08/05/18 0404  Blood Culture (routine x 2)  BLOOD CULTURE X 2,   STAT     08/05/18 0404   08/05/18 0404  Urine culture  ONCE - STAT,   STAT     08/05/18 0404          Vitals/Pain Today's Vitals   08/05/18 0945 08/05/18 1000 08/05/18 1015 08/05/18 1030  BP: 124/63 116/60 117/65 124/67  Pulse: 74 72 70 72  Resp: 15 10 17 14   Temp:      TempSrc:      SpO2: 98% 92% 98% 100%  Weight:      Height:      PainSc:        Isolation Precautions Droplet and Contact precautions  Medications Medications  ceFEPIme (MAXIPIME) 2 g in sodium chloride 0.9 % 100 mL IVPB (has no administration in time range)  vancomycin (VANCOCIN) 1,750 mg in sodium chloride 0.9 % 500 mL IVPB (has no administration in time range)  0.9 %  sodium chloride infusion ( Intravenous New Bag/Given 08/05/18 0630)  aspirin EC tablet 81 mg (has no administration in time range)  HYDROcodone-acetaminophen (NORCO) 10-325 MG per tablet 1 tablet (has no administration in time range)  atorvastatin (LIPITOR) tablet 80 mg (has no administration in time range)  torsemide (DEMADEX) tablet  10 mg (has no administration in time range)  ALPRAZolam (XANAX) tablet 0.5 mg (has no administration in time range)  albuterol (PROVENTIL HFA;VENTOLIN HFA) 108 (90 Base) MCG/ACT inhaler 2 puff (has no administration in time range)  dorzolamide-timolol (COSOPT) 22.3-6.8 MG/ML ophthalmic solution 1 drop (has no administration in time range)  Insulin Detemir (LEVEMIR) FlexPen 10 Units (has no administration in time range)  metoprolol succinate (TOPROL-XL) 24 hr tablet 100 mg (has no administration in time range)  ceFEPIme (MAXIPIME) 2 g in sodium chloride 0.9 % 100 mL IVPB (0 g Intravenous Stopped 08/05/18 0447)  metroNIDAZOLE (FLAGYL) IVPB 500 mg (0 mg Intravenous Stopped 08/05/18 0520)  sodium chloride 0.9 % bolus 1,000 mL (0 mLs Intravenous Stopped 08/05/18 0450)  vancomycin (VANCOCIN) 2,000 mg in sodium chloride 0.9 % 500 mL IVPB (0 mg Intravenous Stopped 08/05/18 0923)  acetaminophen (TYLENOL) tablet 500 mg (500 mg Oral Given 08/05/18 6301)    Mobility walks Low fall risk   Focused Assessments Pulmonary Assessment Handoff:  Lung sounds:   O2 Device: Room Air  R Recommendations: See Admitting Provider Note  Report given to:   Additional Notes: .

## 2018-08-05 NOTE — ED Triage Notes (Addendum)
Patient reports fever of 101 at home since this evening and generalized weakness since yesterday. Denies chest pain, SOB, nausea, vomiting, and diarrhea. Given 1000mg  Tylenol by EMS.

## 2018-08-05 NOTE — Progress Notes (Signed)
Pharmacy Antibiotic Note  David [name] is a 73 y.o. male admitted on 08/05/2018 with sepsis.  Pharmacy has been consulted for Vancomycin and Cefepime dosing.  Plan: Cefepime 2gm IV q12h Vancomycin 2000mg  IV now then 1750 mg IV Q 24 hrs. Goal AUC 400-550. Expected AUC: 514 SCr used: 1.52 Will f/u renal function, micro data, and pt's clinical condition Vanc levels prn   Height: 6\' 2"  (188 cm) Weight: 220 lb (99.8 kg) IBW/kg (Calculated) : 82.2  Temp (24hrs), Avg:104.9 F (40.5 C), Min:104.9 F (40.5 C), Max:104.9 F (40.5 C)  No results for input(s): WBC, CREATININE, LATICACIDVEN, VANCOTROUGH, VANCOPEAK, VANCORANDOM, GENTTROUGH, GENTPEAK, GENTRANDOM, TOBRATROUGH, TOBRAPEAK, TOBRARND, AMIKACINPEAK, AMIKACINTROU, AMIKACIN in the last 168 hours.  Estimated Creatinine Clearance: 56.2 mL/min (A) (by C-G formula based on SCr of 1.52 mg/dL (H)).    No Known Allergies  Antimicrobials this admission: 3/19 Vanc >>  3/19 Cefepime >>   Microbiology results: 3/19 BCx:  3/19 UCx:    Thank you for allowing pharmacy to be a part of this patient's care.  Sherlon Handing, PharmD, BCPS Clinical pharmacist  **Pharmacist phone directory can now be found on [email or web address] ([email or web address]).  Listed under [place]. 08/05/2018 4:27 AM

## 2018-08-05 NOTE — H&P (Signed)
History and Physical  David [name] WRU:[id number] DOB: 05-Oct-1945 DOA: 08/05/2018  PCP: [name], MD   Chief Complaint: fever  HPI:  [id number] PMH CAD, diastolic CHF, pulm HTN, DM type 2, CKD III  Presented with fever up to 104.9, generalized weakness, dry cough within last 24 hours. CXR suggests pneumonia, flu swab sent, EDP request admission.  No pain, no SOB, no other complaints. Dry cough per wife. No specific aggravating or alleviating factors.  COVID SCREEN Fever: YES Cough: YES dry last 12 hours   SOB: NO URI symptoms: NO GI symptoms: NO V/D.  Pt has chronic constipation. Very poor appetite (very unusual) Travel: not out of county BUT has had contact with people traveling from [place] contacts: NO  ED Course: cefepime, metronidazole, vancomycin, flu swab  Review of Systems:  Negative for visual changes, sore throat, rash, new muscle aches, chest pain, dysuria, bleeding, n/v/abdominal pain.  Otherwise as in HPI  Past Medical History:  Diagnosis Date  . Arthritis   . CAD in native artery    s/p stent in 11/18  . CHF (congestive heart failure) ([place])    normal echo in 11/18  . CKD (chronic kidney disease)   . DDD (degenerative disc disease), cervical   . Diabetes mellitus with complication (HCC)    Type II  . Diabetic neuropathy ([place])   . Diabetic retinopathy ([place])   . GERD (gastroesophageal reflux disease)   . Glaucoma   . Hypertension     Past Surgical History:  Procedure Laterality Date  . AMPUTATION Right 07/09/2016   Procedure: Right Great Toe Amputation at Metatarsophalangeal Joint;  Surgeon: [name], MD;  Location: [facility];  Service: Orthopedics;  Laterality: Right;  . AMPUTATION Right 05/28/2018   Procedure: RIGHT SECOND TOE AMPUTATION;  Surgeon: [name], MD;  Location: [place];  Service: Orthopedics;  Laterality: Right;  . BACK SURGERY     4  . CARDIAC CATHETERIZATION    . CORONARY STENT INTERVENTION N/A 04/06/2017   Procedure:  CORONARY STENT INTERVENTION;  Surgeon: Nigel Mormon, MD;  Location: [facility];  Service: Cardiovascular;  Laterality: N/A;  . CORONARY STENT INTERVENTION N/A 04/07/2017   Procedure: CORONARY STENT INTERVENTION;  Surgeon: Nigel Mormon, MD;  Location: [facility];  Service: Cardiovascular;  Laterality: N/A;  . CYST EXCISION     on Back  . JOINT REPLACEMENT    . LAPAROSCOPIC ABDOMINAL EXPLORATION N/A 11/26/2017   Procedure: LAPAROSCOPIC ABDOMINAL EXPLORATION, DRAINAGE OF APPENDICEAL ABCESS. PLACEMENT OF DRAIN;  Surgeon: Alphonsa Overall, MD;  Location: [facility];  Service: General;  Laterality: N/A;  . RIGHT/LEFT HEART CATH AND CORONARY ANGIOGRAPHY N/A 04/06/2017   Procedure: RIGHT/LEFT HEART CATH AND CORONARY ANGIOGRAPHY;  Surgeon: Nigel Mormon, MD;  Location: [facility];  Service: Cardiovascular;  Laterality: N/A;  . TOE SURGERY  05/2018   Left   . TOTAL HIP ARTHROPLASTY     Right      reports that he has never smoked. He has never used smokeless tobacco. He reports previous alcohol use. He reports that he does not use drugs.  No Known Allergies  Family History  Problem Relation Age of Onset  . Diabetes Other   . Hyperlipidemia Other   . Hypertension Other   . Stroke Other   . Alzheimer's disease Other   . Thyroid disease Mother   . Diabetes Mellitus II Father   . Alzheimer's disease Father  Prior to Admission medications   Medication Sig Start Date End Date Taking? Authorizing Provider  acetaminophen (TYLENOL) 325 MG tablet Take 2 tablets (650 mg total) by mouth every 6 (six) hours as needed for mild pain or moderate pain (or Fever >/= 101). 12/09/17  Yes Patrecia Pour, MD  albuterol (PROVENTIL HFA;VENTOLIN HFA) 108 (90 Base) MCG/ACT inhaler Inhale 2 puffs into the lungs every 6 (six) hours as needed for wheezing or shortness of breath. 04/09/17  Yes Barton Dubois, MD  ALPRAZolam Duanne Moron) 1 MG tablet Take 1 tablet (1 mg total) by mouth 2  (two) times daily as needed for anxiety. 12/09/17  Yes Patrecia Pour, MD  amLODipine (NORVASC) 10 MG tablet Take 1 tablet (10 mg total) by mouth daily. 12/09/17  Yes Patrecia Pour, MD  aspirin EC 81 MG tablet Take 81 mg by mouth daily.   Yes [provider]  atorvastatin (LIPITOR) 80 MG tablet Take 1 tablet (80 mg total) by mouth at bedtime. 07/30/18  Yes Patwardhan, Manish J, MD  dorzolamide-timolol (COSOPT) 22.3-6.8 MG/ML ophthalmic solution Place 1 drop into both eyes 2 (two) times daily. 11/17/17  Yes [provider]  ferrous sulfate 325 (65 FE) MG tablet Take 325 mg by mouth daily with breakfast.   Yes [provider]  gabapentin (NEURONTIN) 600 MG tablet Take 600 mg by mouth 2 (two) times daily.  07/29/18  Yes [provider]  HUMALOG KWIKPEN 100 UNIT/ML KiwkPen Inject 4 Units into the skin 3 (three) times daily. 08/18/17  Yes [provider]  HYDROcodone-acetaminophen (NORCO) 10-325 MG tablet Take 1 tablet by mouth every 6 (six) hours as needed for moderate pain.   Yes [provider]  Insulin Detemir (LEVEMIR FLEXTOUCH) 100 UNIT/ML Pen Inject 20 Units into the skin daily. 12/09/17  Yes Patrecia Pour, MD  isosorbide-hydrALAZINE (BIDIL) 20-37.5 MG tablet Take 1 tablet by mouth 3 (three) times daily. Patient taking differently: Take 2 tablets by mouth 3 (three) times daily.  09/08/16  Yes Ghimire, Henreitta Leber, MD  methocarbamol (ROBAXIN) 750 MG tablet Take 1 tablet (750 mg total) by mouth every 6 (six) hours as needed for muscle spasms. 09/08/16  Yes Ghimire, Henreitta Leber, MD  metoprolol succinate (TOPROL-XL) 100 MG 24 hr tablet TAKE 1 TABLET BY MOUTH EVERY DAY Patient taking differently: Take 100 mg by mouth daily.  06/24/18  Yes Miquel Dunn, NP  saccharomyces boulardii (FLORASTOR) 250 MG capsule Take 1 capsule (250 mg total) by mouth 2 (two) times daily. 12/09/17  Yes Patrecia Pour, MD  torsemide (DEMADEX) 20 MG tablet Take 10 mg by mouth daily.    Yes [provider]  fluticasone (FLONASE) 50 MCG/ACT nasal spray Place 1 spray into both nostrils daily. Patient not taking: Reported on 08/05/2018 04/09/17   Barton Dubois, MD  mupirocin ointment (BACTROBAN) 2 % Apply 1 application topically daily. Patient not taking: Reported on 08/05/2018 06/08/18   Rayburn, Neta Mends, PA-C    Physical Exam: Vitals:   08/05/18 0700 08/05/18 0730  BP: 112/61 (!) 110/54  Pulse: 81 76  Resp: 13 14  Temp:    SpO2: 97% 96%    Constitutional:   . Appears calm and comfortable Eyes:  . pupils and irises appear normal . Normal lids   ENMT:  . grossly normal hearing  . Lips appear normal Neck:  . neck appears normal, no masses  . no thyromegaly Respiratory:  . CTA bilaterally, no w/r/r.  . Respiratory effort  normal.  Cardiovascular:  . RRR, no m/r/g . No LE extremity edema   Abdomen:  . no tenderness or masses; distended . No hernia noted Musculoskeletal:  . Digits/nails BUE: no clubbing, cyanosis, petechiae, infection . RUE, LUE, RLE, LLE   o strength and tone normal, no atrophy, no abnormal movements o No tenderness, masses Skin:  . No rashes, lesions, ulcers . palpation of skin: no induration or nodules Psychiatric:  . Mental status o Mood, affect appropriate . judgment and insight appear intact    I have personally reviewed following labs and imaging studies  Labs:   Urine negative except blood  Other as below  Imaging studies:   As below   Medical tests:   EKG independently reviewed: ST, inferolateral ST depression consider ischemia, new compared to previous    Active Problems:   * No active hospital problems. *   Assessment/Plan 72yom presents with fever 104.9, cough, with bilateral interstitial opacities on CXR suggestive of pneumonia. Flu negative.  Bilateral pneumonia --no evidence of sepsis, no oxygen requirement --empiric abx, r/o COVID --airborne precautions  Flu: negative RVP: not  sent CBC: WBC HIGH; no leukopenia, lymphopenia  BMP: BUN/Cr might be slightly high, baseline is variable LFTs: NORMAL AST/ALT/Tbili  CRP, LDH: not ordered Procalcitonin: HIGH CXR: bilateral fine interstitial opacity. History of fever suggest bronchitis or interstitial pneumonia  COVID subjective risk assessment: moderate (high fever, abnormal CXR, flu negative, contact with people from [place], not lobar pneumonia but not classic findings in labs including has high procalcitonin) Physician PPE: max: gown, gloves, N95, hair net, shoe covers, face shield Patient PPE: surgical mask   COVID Testing: ordered and pending  CKD stage IV vs CKD stage III with AKI --hold diuretic --gentle IVF --BMP in AM  Diastolic CHF --dry now. IVF. Monitor I/O for volume overload. Resume diuretic when appropriate  DM type 2 --stable, longacting insulin + SSI   Severity of Illness: The appropriate patient status for this patient is INPATIENT. Inpatient status is judged to be reasonable and necessary in order to provide the required intensity of service to ensure the patient's safety. The patient's presenting symptoms, physical exam findings, and initial radiographic and laboratory data in the context of their chronic comorbidities is felt to place them at high risk for further clinical deterioration. Furthermore, it is not anticipated that the patient will be medically stable for discharge from the hospital within 2 midnights of admission. The following factors support the patient status of inpatient.   " The patient's presenting symptoms include HIGH FEVER, cough. " The worrisome physical exam findings include benign. " The initial radiographic and laboratory data are worrisome because of bilateral pulmonary infiltrates and negative flu " The chronic co-morbidities include DM, chronic diastolic CHF.   * I certify that at the point of admission it is my clinical judgment that the patient will require  inpatient hospital care spanning beyond 2 midnights from the point of admission due to high intensity of service, high risk for further deterioration and high frequency of surveillance required.*     DVT prophylaxis:SCDs Code Status: Full Family Communication: wife at bedside Consults called: none    Time spent: 73 minutes  Murray Hodgkins, MD  [facility] Direct contact: see [email or web address]  7PM-7AM contact night coverage as below   1. Check the care team in [facility] and look for a) attending/consulting [facility] provider listed and b) the [facility] team listed 2. Log into [email or web address] and use [place]'s universal password to access.  If you do not have the password, please contact the hospital operator. 3. Locate the [facility] provider you are looking for under [facility] and page to a number that you can be directly reached. 4. If you still have difficulty reaching the provider, please page the [facility] (Director on Call) for the Hospitalists listed on amion for assistance.   08/05/2018, 8:01 AM

## 2018-08-06 DIAGNOSIS — N189 Chronic kidney disease, unspecified: Secondary | ICD-10-CM

## 2018-08-06 DIAGNOSIS — N179 Acute kidney failure, unspecified: Secondary | ICD-10-CM

## 2018-08-06 DIAGNOSIS — I6523 Occlusion and stenosis of bilateral carotid arteries: Secondary | ICD-10-CM

## 2018-08-06 LAB — BLOOD CULTURE ID PANEL (REFLEXED)
Acinetobacter baumannii: NOT DETECTED
Candida albicans: NOT DETECTED
Candida glabrata: NOT DETECTED
Candida krusei: NOT DETECTED
Candida parapsilosis: NOT DETECTED
Candida tropicalis: NOT DETECTED
Enterobacter cloacae complex: NOT DETECTED
Enterobacteriaceae species: NOT DETECTED
Enterococcus species: NOT DETECTED
Escherichia coli: NOT DETECTED
Haemophilus influenzae: NOT DETECTED
Klebsiella oxytoca: NOT DETECTED
Klebsiella pneumoniae: NOT DETECTED
LISTERIA MONOCYTOGENES: NOT DETECTED
Neisseria meningitidis: NOT DETECTED
PROTEUS SPECIES: NOT DETECTED
PSEUDOMONAS AERUGINOSA: NOT DETECTED
STAPHYLOCOCCUS AUREUS BCID: NOT DETECTED
STAPHYLOCOCCUS SPECIES: NOT DETECTED
STREPTOCOCCUS PYOGENES: NOT DETECTED
Serratia marcescens: NOT DETECTED
Streptococcus agalactiae: NOT DETECTED
Streptococcus pneumoniae: NOT DETECTED
Streptococcus species: DETECTED — AB

## 2018-08-06 LAB — CBC
HCT: 25.1 % — ABNORMAL LOW (ref 39.0–52.0)
Hemoglobin: 7.8 g/dL — ABNORMAL LOW (ref 13.0–17.0)
MCH: 34.5 pg — ABNORMAL HIGH (ref 26.0–34.0)
MCHC: 31.1 g/dL (ref 30.0–36.0)
MCV: 111.1 fL — ABNORMAL HIGH (ref 80.0–100.0)
Platelets: 210 10*3/uL (ref 150–400)
RBC: 2.26 MIL/uL — AB (ref 4.22–5.81)
RDW: 11.5 % (ref 11.5–15.5)
WBC: 15.4 10*3/uL — ABNORMAL HIGH (ref 4.0–10.5)
nRBC: 0 % (ref 0.0–0.2)

## 2018-08-06 LAB — COMPREHENSIVE METABOLIC PANEL
ALBUMIN: 2.3 g/dL — AB (ref 3.5–5.0)
ALT: 25 U/L (ref 0–44)
ANION GAP: 5 (ref 5–15)
AST: 31 U/L (ref 15–41)
Alkaline Phosphatase: 104 U/L (ref 38–126)
BUN: 31 mg/dL — ABNORMAL HIGH (ref 8–23)
CO2: 22 mmol/L (ref 22–32)
Calcium: 7.8 mg/dL — ABNORMAL LOW (ref 8.9–10.3)
Chloride: 112 mmol/L — ABNORMAL HIGH (ref 98–111)
Creatinine, Ser: 2.78 mg/dL — ABNORMAL HIGH (ref 0.61–1.24)
GFR calc non Af Amer: 22 mL/min — ABNORMAL LOW (ref >60)
GFR, EST AFRICAN AMERICAN: 25 mL/min — AB (ref >60)
Glucose, Bld: 150 mg/dL — ABNORMAL HIGH (ref 70–99)
Potassium: 4.2 mmol/L (ref 3.5–5.1)
Sodium: 139 mmol/L (ref 135–145)
Total Bilirubin: 0.7 mg/dL (ref 0.3–1.2)
Total Protein: 6.2 g/dL — ABNORMAL LOW (ref 6.5–8.1)

## 2018-08-06 LAB — URINE CULTURE

## 2018-08-06 LAB — GLUCOSE, CAPILLARY
Glucose-Capillary: 123 mg/dL — ABNORMAL HIGH (ref 70–99)
Glucose-Capillary: 108 mg/dL — ABNORMAL HIGH (ref 70–99)
Glucose-Capillary: 119 mg/dL — ABNORMAL HIGH (ref 70–99)
Glucose-Capillary: 147 mg/dL — ABNORMAL HIGH (ref 70–99)

## 2018-08-06 MED ORDER — ISOSORB DINITRATE-HYDRALAZINE 20-37.5 MG PO TABS
1.0000 | ORAL_TABLET | Freq: Three times a day (TID) | ORAL | Status: DC
  Administered 2018-08-06 – 2018-08-15 (×28): 1 via ORAL
  Filled 2018-08-06 (×30): qty 1

## 2018-08-06 MED ORDER — GLUCERNA SHAKE PO LIQD
237.0000 mL | Freq: Three times a day (TID) | ORAL | Status: DC
  Administered 2018-08-06: 237 mL via ORAL

## 2018-08-06 MED ORDER — DICYCLOMINE HCL 10 MG PO CAPS
10.0000 mg | ORAL_CAPSULE | Freq: Three times a day (TID) | ORAL | Status: DC
  Administered 2018-08-06 – 2018-08-10 (×17): 10 mg via ORAL
  Filled 2018-08-06 (×20): qty 1

## 2018-08-06 MED ORDER — BOOST / RESOURCE BREEZE PO LIQD CUSTOM
1.0000 | Freq: Three times a day (TID) | ORAL | Status: DC
  Administered 2018-08-06: 1 via ORAL

## 2018-08-06 NOTE — Progress Notes (Signed)
PHARMACY - PHYSICIAN COMMUNICATION CRITICAL VALUE ALERT - BLOOD CULTURE IDENTIFICATION (BCID)  David [name] is an 73 y.o. male who presented to [facility] on 08/05/2018 with a chief complaint of fever.  Assessment:  He is being treated for pneumonia with Ceftriaxone. He has 1 blood culture bottle growing Gram positive cocci. BCID detected a Streptococcus species. This likely represents contamination. If it is a true infection Ceftriaxone would be the treatment of choice, so he is currently covered.  Name of physician (or Provider) Contacted: Dr. Wynelle Cleveland   Current antibiotics: Ceftriaxone  Changes to prescribed antibiotics recommended:  Patient is on recommended antibiotics - No changes needed  Results for orders placed or performed during the hospital encounter of 08/05/18  Blood Culture ID Panel (Reflexed) (Collected: 08/05/2018  4:09 AM)  Result Value Ref Range   Enterococcus species NOT DETECTED NOT DETECTED   Listeria monocytogenes NOT DETECTED NOT DETECTED   Staphylococcus species NOT DETECTED NOT DETECTED   Staphylococcus aureus (BCID) NOT DETECTED NOT DETECTED   Streptococcus species DETECTED (A) NOT DETECTED   Streptococcus agalactiae NOT DETECTED NOT DETECTED   Streptococcus pneumoniae NOT DETECTED NOT DETECTED   Streptococcus pyogenes NOT DETECTED NOT DETECTED   Acinetobacter baumannii NOT DETECTED NOT DETECTED   Enterobacteriaceae species NOT DETECTED NOT DETECTED   Enterobacter cloacae complex NOT DETECTED NOT DETECTED   Escherichia coli NOT DETECTED NOT DETECTED   Klebsiella oxytoca NOT DETECTED NOT DETECTED   Klebsiella pneumoniae NOT DETECTED NOT DETECTED   Proteus species NOT DETECTED NOT DETECTED   Serratia marcescens NOT DETECTED NOT DETECTED   Haemophilus influenzae NOT DETECTED NOT DETECTED   Neisseria meningitidis NOT DETECTED NOT DETECTED   Pseudomonas aeruginosa NOT DETECTED NOT DETECTED   Candida albicans NOT DETECTED NOT DETECTED   Candida glabrata NOT  DETECTED NOT DETECTED   Candida krusei NOT DETECTED NOT DETECTED   Candida parapsilosis NOT DETECTED NOT DETECTED   Candida tropicalis NOT DETECTED NOT DETECTED   Legrand Como, Pharm.D., BCPS, BCIDP Clinical Pharmacist Phone: [phone] Please check AMION for all [place] numbers 08/06/2018, 9:47 AM

## 2018-08-06 NOTE — Progress Notes (Signed)
PROGRESS NOTE    David [name]   ALP:[id number]  DOB: March 08, 1946  DOA: 08/05/2018 PCP: [name], MD   Brief Narrative:  David [name] is a 73 y/o with PMH for CAD, diastolic CHF, severe pulm HTN, DM2, CKD 3 who presents to the hospital for fever, sever fatigue and is found to have a fever of 104.9, CXR with b/l infiltrates and AKI.    Subjective: He states he feels a little better today. He has been urinating normally. He has no complaints.     Assessment & Plan:   Principal Problem:   Pneumonia - sepsis- fever/ leukocytosis/ tachycardia  - cont Azithromycin and Ceftriaxone, checking for COVID 19  Active Problems:   AKI (acute kidney injury)-   Chronic heart failure with preserved ejection fraction (HFpEF)  - ? Prerenal as he has been on Demadex at home and recently the dose was cut back by Dr [name]- follow- he is urinating well    Insulin-requiring or dependent type II diabetes mellitus  - cont Levemir and SSI    Bilateral carotid artery stenosis   Coronary artery disease involving native coronary artery of native heart without angina pectoris - cont Aspirin and Statin   Time spent in minutes: 35 DVT prophylaxis:  SCD Code Status: Full code Family Communication: no family at bedside Disposition Plan: home in 1-2 days Consultants:   none Procedures:   none Antimicrobials:  Anti-infectives (From admission, onward)   Start     Dose/Rate Route Frequency Ordered Stop   08/06/18 0600  vancomycin (VANCOCIN) 1,750 mg in sodium chloride 0.9 % 500 mL IVPB  Status:  Discontinued     1,750 mg 250 mL/hr over 120 Minutes Intravenous Every 24 hours 08/05/18 0434 08/05/18 1049   08/05/18 1700  ceFEPIme (MAXIPIME) 2 g in sodium chloride 0.9 % 100 mL IVPB  Status:  Discontinued     2 g 200 mL/hr over 30 Minutes Intravenous Every 12 hours 08/05/18 0434 08/05/18 1049   08/05/18 1230  azithromycin (ZITHROMAX) tablet 500 mg     500 mg Oral Every 24 hours 08/05/18 1049  08/12/18 1229   08/05/18 1200  cefTRIAXone (ROCEPHIN) 2 g in sodium chloride 0.9 % 100 mL IVPB     2 g 200 mL/hr over 30 Minutes Intravenous Every 24 hours 08/05/18 1049 08/12/18 1159   08/05/18 0415  ceFEPIme (MAXIPIME) 2 g in sodium chloride 0.9 % 100 mL IVPB     2 g 200 mL/hr over 30 Minutes Intravenous  Once 08/05/18 0404 08/05/18 0447   08/05/18 0415  metroNIDAZOLE (FLAGYL) IVPB 500 mg     500 mg 100 mL/hr over 60 Minutes Intravenous  Once 08/05/18 0404 08/05/18 0520   08/05/18 0415  vancomycin (VANCOCIN) IVPB 1000 mg/200 mL premix  Status:  Discontinued     1,000 mg 200 mL/hr over 60 Minutes Intravenous  Once 08/05/18 0404 08/05/18 0407   08/05/18 0415  vancomycin (VANCOCIN) 2,000 mg in sodium chloride 0.9 % 500 mL IVPB     2,000 mg 250 mL/hr over 120 Minutes Intravenous  Once 08/05/18 0407 08/05/18 0923       Objective: Vitals:   08/05/18 1317 08/05/18 1600 08/06/18 0007 08/06/18 0812  BP: 120/80 140/80 124/82 (!) 143/74  Pulse: 80  78 77  Resp:   16 19  Temp: 98 F (36.7 C)  99.1 F (37.3 C) 99.6 F (37.6 C)  TempSrc: Oral  Oral Oral  SpO2: 96%  97%   Weight:  Height:        Intake/Output Summary (Last 24 hours) at 08/06/2018 1355 Last data filed at 08/06/2018 0400 Gross per 24 hour  Intake 2002.12 ml  Output -  Net 2002.12 ml   Filed Weights   08/05/18 0336  Weight: 99.8 kg    Examination: General exam: Appears comfortable  HEENT: PERRLA, oral mucosa moist, no sclera icterus or thrush Respiratory system: Clear to auscultation. Respiratory effort normal. Cardiovascular system: S1 & S2 heard, RRR.   Gastrointestinal system: Abdomen soft, non-tender, nondistended. Normal bowel sounds. Central nervous system: Alert and oriented. No focal neurological deficits. Extremities: No cyanosis, clubbing or edema Skin: No rashes or ulcers Psychiatry:  Mood & affect appropriate.     Data Reviewed: I have personally reviewed following labs and imaging studies   CBC: Recent Labs  Lab 08/05/18 0409 08/06/18 0430  WBC 11.9* 15.4*  NEUTROABS 11.1*  --   HGB 9.5* 7.8*  HCT 31.1* 25.1*  MCV 112.7* 111.1*  PLT 231 213   Basic Metabolic Panel: Recent Labs  Lab 08/05/18 0409 08/06/18 0430  NA 136 139  K 4.0 4.2  CL 105 112*  CO2 19* 22  GLUCOSE 193* 150*  BUN 24* 31*  CREATININE 2.55* 2.78*  CALCIUM 8.1* 7.8*   GFR: Estimated Creatinine Clearance: 30.7 mL/min (A) (by C-G formula based on SCr of 2.78 mg/dL (H)). Liver Function Tests: Recent Labs  Lab 08/05/18 0409 08/06/18 0430  AST 29 31  ALT 18 25  ALKPHOS 147* 104  BILITOT 1.2 0.7  PROT 6.8 6.2*  ALBUMIN 2.8* 2.3*   No results for input(s): LIPASE, AMYLASE in the last 168 hours. No results for input(s): AMMONIA in the last 168 hours. Coagulation Profile: No results for input(s): INR, PROTIME in the last 168 hours. Cardiac Enzymes: No results for input(s): CKTOTAL, CKMB, CKMBINDEX, TROPONINI in the last 168 hours. BNP (last 3 results) No results for input(s): PROBNP in the last 8760 hours. HbA1C: No results for input(s): HGBA1C in the last 72 hours. CBG: Recent Labs  Lab 08/05/18 1212 08/05/18 1602 08/05/18 2153 08/06/18 0813 08/06/18 1142  GLUCAP 231* 172* 162* 123* 108*   Lipid Profile: No results for input(s): CHOL, HDL, LDLCALC, TRIG, CHOLHDL, LDLDIRECT in the last 72 hours. Thyroid Function Tests: No results for input(s): TSH, T4TOTAL, FREET4, T3FREE, THYROIDAB in the last 72 hours. Anemia Panel: No results for input(s): VITAMINB12, FOLATE, FERRITIN, TIBC, IRON, RETICCTPCT in the last 72 hours. Urine analysis:    Component Value Date/Time   COLORURINE AMBER (A) 08/05/2018 0419   APPEARANCEUR HAZY (A) 08/05/2018 0419   LABSPEC 1.019 08/05/2018 0419   PHURINE 5.0 08/05/2018 0419   GLUCOSEU NEGATIVE 08/05/2018 0419   HGBUR MODERATE (A) 08/05/2018 0419   BILIRUBINUR NEGATIVE 08/05/2018 0419   KETONESUR NEGATIVE 08/05/2018 0419   PROTEINUR 100 (A)  08/05/2018 0419   UROBILINOGEN 1.0 02/13/2015 1934   NITRITE NEGATIVE 08/05/2018 0419   LEUKOCYTESUR NEGATIVE 08/05/2018 0419   Sepsis Labs: @LABRCNTIP (procalcitonin:4,lacticidven:4) ) Recent Results (from the past 240 hour(s))  Blood Culture (routine x 2)     Status: None (Preliminary result)   Collection Time: 08/05/18  4:09 AM  Result Value Ref Range Status   Specimen Description BLOOD LEFT ANTECUBITAL  Final   Special Requests   Final    BOTTLES DRAWN AEROBIC AND ANAEROBIC Blood Culture adequate volume   Culture  Setup Time   Final    GRAM POSITIVE COCCI ANAEROBIC BOTTLE ONLY Organism ID to follow CRITICAL  RESULT CALLED TO, READ BACK BY AND VERIFIED WITH: Burman Foster [phone] [id number] FCP    Culture   Final    NO GROWTH 1 DAY Performed at [facility], [place] [address] [place], [place] [address]    Report Status PENDING  Incomplete  Blood Culture (routine x 2)     Status: None (Preliminary result)   Collection Time: 08/05/18  4:09 AM  Result Value Ref Range Status   Specimen Description BLOOD RIGHT ARM  Final   Special Requests   Final    BOTTLES DRAWN AEROBIC AND ANAEROBIC Blood Culture adequate volume   Culture   Final    NO GROWTH 1 DAY Performed at [facility], [place] [address] [place], [place] [address]    Report Status PENDING  Incomplete  Blood Culture ID Panel (Reflexed)     Status: Abnormal   Collection Time: 08/05/18  4:09 AM  Result Value Ref Range Status   Enterococcus species NOT DETECTED NOT DETECTED Final   Listeria monocytogenes NOT DETECTED NOT DETECTED Final   Staphylococcus species NOT DETECTED NOT DETECTED Final   Staphylococcus aureus (BCID) NOT DETECTED NOT DETECTED Final   Streptococcus species DETECTED (A) NOT DETECTED Final    Comment: Not Enterococcus species, Streptococcus agalactiae, Streptococcus pyogenes, or Streptococcus pneumoniae. CRITICAL RESULT CALLED TO, READ BACK BY AND VERIFIED WITH: PHARMD TURNER, M [id number] [id number] FCP     Streptococcus agalactiae NOT DETECTED NOT DETECTED Final   Streptococcus pneumoniae NOT DETECTED NOT DETECTED Final   Streptococcus pyogenes NOT DETECTED NOT DETECTED Final   Acinetobacter baumannii NOT DETECTED NOT DETECTED Final   Enterobacteriaceae species NOT DETECTED NOT DETECTED Final   Enterobacter cloacae complex NOT DETECTED NOT DETECTED Final   Escherichia coli NOT DETECTED NOT DETECTED Final   Klebsiella oxytoca NOT DETECTED NOT DETECTED Final   Klebsiella pneumoniae NOT DETECTED NOT DETECTED Final   Proteus species NOT DETECTED NOT DETECTED Final   Serratia marcescens NOT DETECTED NOT DETECTED Final   Haemophilus influenzae NOT DETECTED NOT DETECTED Final   Neisseria meningitidis NOT DETECTED NOT DETECTED Final   Pseudomonas aeruginosa NOT DETECTED NOT DETECTED Final   Candida albicans NOT DETECTED NOT DETECTED Final   Candida glabrata NOT DETECTED NOT DETECTED Final   Candida krusei NOT DETECTED NOT DETECTED Final   Candida parapsilosis NOT DETECTED NOT DETECTED Final   Candida tropicalis NOT DETECTED NOT DETECTED Final    Comment: Performed at [facility], [place] [address] [place], [place] [address]  Urine culture     Status: Abnormal   Collection Time: 08/05/18  4:20 AM  Result Value Ref Range Status   Specimen Description URINE, RANDOM  Final   Special Requests NONE  Final   Culture (A)  Final    <10,000 COLONIES/mL INSIGNIFICANT GROWTH Performed at [facility], [place] [address], [place], [place] [address]    Report Status 08/06/2018 FINAL  Final         Radiology Studies: Dg Chest Port 1 View  Result Date: 08/05/2018 CLINICAL DATA:  Fever EXAM: PORTABLE CHEST 1 VIEW COMPARISON:  01/15/2018 FINDINGS: Cardiomegaly with coronary stenting. Low volume chest. Fine interstitial opacity on both sides without Kerley lines, vascular pedicle widening, or effusion. No pneumothorax IMPRESSION: 1. Low volume chest with bilateral fine interstitial opacity.  History of fever suggest bronchitis or interstitial pneumonia. 2. Chronic cardiomegaly. Electronically Signed   By: Monte Fantasia M.D.   On: 08/05/2018 04:37  Scheduled Meds: . aspirin EC  81 mg Oral Daily  . atorvastatin  80 mg Oral QHS  . azithromycin  500 mg Oral Q24H  . dicyclomine  10 mg Oral TID AC & HS  . dorzolamide-timolol  1 drop Both Eyes BID  . feeding supplement (ENSURE ENLIVE)  237 mL Oral BID BM  . insulin aspart  0-5 Units Subcutaneous QHS  . insulin aspart  0-9 Units Subcutaneous TID WC  . insulin detemir  10 Units Subcutaneous Daily  . isosorbide-hydrALAZINE  1 tablet Oral TID  . metoprolol succinate  100 mg Oral Daily  . sodium chloride flush  3 mL Intravenous Q12H   Continuous Infusions: . sodium chloride 125 mL/hr at 08/06/18 0857  . cefTRIAXone (ROCEPHIN)  IV 2 g (08/06/18 1206)     LOS: 1 day      Debbe Odea, MD [facility] Pager: [email or web address] Password [email or web address] 08/06/2018, 1:55 PM

## 2018-08-06 NOTE — Progress Notes (Signed)
Initial Nutrition Assessment  DOCUMENTATION CODES:   Not applicable  INTERVENTION:    Boost Breeze po TID, each supplement provides 250 kcal and 9 grams of protein  NUTRITION DIAGNOSIS:   Increased nutrient needs related to acute illness as evidenced by estimated needs  GOAL:   Patient will meet greater than or equal to 90% of their needs   MONITOR:   PO intake, Supplement acceptance, Labs, Skin, Weight trends, I & O's  REASON FOR ASSESSMENT:   Malnutrition Screening Tool  ASSESSMENT:   73 yo Male with PMH CAD, diastolic CHF, pulm HTN, DM type 2, CKD III; presented with fever up to 104.9, generalized weakness and dry cough within last 24 hours.   Pt admitted with PNA, AKI and fever.  RD unable to obtain nutrition history from pt. He is currently being ruled out for COVID-19. Spoke with Andee Poles, RN.  RN reports pt does not have an appetite. He does not want Ensure supplements as well. Will trial Boost Breeze supplements.  Per nutrition screen pt has also lost weight without trying. Per readings below pt has had a 5% weight loss since 05/2018. Weight loss is not significant for time frame.  Labs & medications reviewed. CBG's [phone].  NUTRITION - FOCUSED PHYSICAL EXAM:  Unable to assess at this time  Diet Order:   Diet Order            Diet heart healthy/carb modified Room service appropriate? Yes; Fluid consistency: Thin  Diet effective now             EDUCATION NEEDS:   Not appropriate for education at this time  Skin:  Skin Assessment: Reviewed RN Assessment  Last BM:  3/19  Height:   Ht Readings from Last 1 Encounters:  08/05/18 6\' 2"  (1.88 m)   Weight:   Wt Readings from Last 1 Encounters:  08/05/18 99.8 kg   Wt Readings from Last 10 Encounters:  08/05/18 99.8 kg  07/30/18 100.8 kg  06/29/18 98.4 kg  06/15/18 98.4 kg  06/08/18 98.4 kg  05/28/18 98.4 kg  05/24/18 105.7 kg  04/21/18 105.7 kg  03/23/18 105.7 kg  02/16/18  105.7 kg   BMI:  Body mass index is 28.25 kg/m.  Estimated Nutritional Needs:   Kcal:  1900-2100  Protein:  90-105 gm  Fluid:  1.9-2.1 L  Arthur Holms, RD, LDN Pager #: [phone] After-Hours Pager #: [phone]

## 2018-08-07 ENCOUNTER — Inpatient Hospital Stay (HOSPITAL_COMMUNITY): Payer: Medicare Other

## 2018-08-07 ENCOUNTER — Encounter (HOSPITAL_COMMUNITY): Payer: Self-pay | Admitting: Pulmonary Disease

## 2018-08-07 DIAGNOSIS — J80 Acute respiratory distress syndrome: Secondary | ICD-10-CM

## 2018-08-07 DIAGNOSIS — Z789 Other specified health status: Secondary | ICD-10-CM

## 2018-08-07 HISTORY — PX: ENDOTRACHEAL INTUBATION EMERGENT: SUR448

## 2018-08-07 LAB — COMPREHENSIVE METABOLIC PANEL
ALT: 27 U/L (ref 0–44)
AST: 38 U/L (ref 15–41)
Albumin: 2.6 g/dL — ABNORMAL LOW (ref 3.5–5.0)
Alkaline Phosphatase: 277 U/L — ABNORMAL HIGH (ref 38–126)
Anion gap: 12 (ref 5–15)
BUN: 27 mg/dL — ABNORMAL HIGH (ref 8–23)
CO2: 18 mmol/L — ABNORMAL LOW (ref 22–32)
Calcium: 8.4 mg/dL — ABNORMAL LOW (ref 8.9–10.3)
Chloride: 112 mmol/L — ABNORMAL HIGH (ref 98–111)
Creatinine, Ser: 2.33 mg/dL — ABNORMAL HIGH (ref 0.61–1.24)
GFR calc Af Amer: 31 mL/min — ABNORMAL LOW (ref >60)
GFR calc non Af Amer: 27 mL/min — ABNORMAL LOW (ref >60)
Glucose, Bld: 181 mg/dL — ABNORMAL HIGH (ref 70–99)
Potassium: 4 mmol/L (ref 3.5–5.1)
SODIUM: 142 mmol/L (ref 135–145)
Total Bilirubin: 1.4 mg/dL — ABNORMAL HIGH (ref 0.3–1.2)
Total Protein: 7.8 g/dL (ref 6.5–8.1)

## 2018-08-07 LAB — POCT I-STAT 7, (LYTES, BLD GAS, ICA,H+H)
Acid-base deficit: 6 mmol/L — ABNORMAL HIGH (ref 0.0–2.0)
Bicarbonate: 18.7 mmol/L — ABNORMAL LOW (ref 20.0–28.0)
Calcium, Ion: 1.12 mmol/L — ABNORMAL LOW (ref 1.15–1.40)
HCT: 28 % — ABNORMAL LOW (ref 39.0–52.0)
Hemoglobin: 9.5 g/dL — ABNORMAL LOW (ref 13.0–17.0)
O2 Saturation: 99 %
PH ART: 7.358 (ref 7.350–7.450)
Potassium: 4.2 mmol/L (ref 3.5–5.1)
Sodium: 142 mmol/L (ref 135–145)
TCO2: 20 mmol/L — ABNORMAL LOW (ref 22–32)
pCO2 arterial: 33.2 mmHg (ref 32.0–48.0)
pO2, Arterial: 119 mmHg — ABNORMAL HIGH (ref 83.0–108.0)

## 2018-08-07 LAB — BLOOD GAS, ARTERIAL
ACID-BASE DEFICIT: 6.6 mmol/L — AB (ref 0.0–2.0)
Acid-base deficit: 7.2 mmol/L — ABNORMAL HIGH (ref 0.0–2.0)
Bicarbonate: 21.3 mmol/L (ref 20.0–28.0)
Bicarbonate: 19.1 mmol/L — ABNORMAL LOW (ref 20.0–28.0)
Drawn by: 270111
Drawn by: 270111
FIO2: 100
MECHVT: 550 mL
O2 CONTENT: 4 L/min
O2 Saturation: 66.8 %
O2 Saturation: 94.1 %
PEEP: 10 cmH2O
PH ART: 7.137 — AB (ref 7.350–7.450)
PO2 ART: 84.2 mmHg (ref 83.0–108.0)
Patient temperature: 98.1
Patient temperature: 98.1
RATE: 28 resp/min
pCO2 arterial: 65.4 mmHg (ref 32.0–48.0)
pCO2 arterial: 47.5 mmHg (ref 32.0–48.0)
pH, Arterial: 7.227 — ABNORMAL LOW (ref 7.350–7.450)
pO2, Arterial: 47 mmHg — ABNORMAL LOW (ref 83.0–108.0)

## 2018-08-07 LAB — GLUCOSE, CAPILLARY
GLUCOSE-CAPILLARY: 119 mg/dL — AB (ref 70–99)
Glucose-Capillary: 166 mg/dL — ABNORMAL HIGH (ref 70–99)
Glucose-Capillary: 166 mg/dL — ABNORMAL HIGH (ref 70–99)
Glucose-Capillary: 154 mg/dL — ABNORMAL HIGH (ref 70–99)

## 2018-08-07 LAB — POCT ACTIVATED CLOTTING TIME: Activated Clotting Time: >1000 seconds

## 2018-08-07 LAB — CBC
HCT: 33.1 % — ABNORMAL LOW (ref 39.0–52.0)
Hemoglobin: 10.2 g/dL — ABNORMAL LOW (ref 13.0–17.0)
MCH: 34.6 pg — AB (ref 26.0–34.0)
MCHC: 30.8 g/dL (ref 30.0–36.0)
MCV: 112.2 fL — ABNORMAL HIGH (ref 80.0–100.0)
Platelets: 370 10*3/uL (ref 150–400)
RBC: 2.95 MIL/uL — ABNORMAL LOW (ref 4.22–5.81)
RDW: 11.8 % (ref 11.5–15.5)
WBC: 31.9 10*3/uL — ABNORMAL HIGH (ref 4.0–10.5)
nRBC: 0 % (ref 0.0–0.2)

## 2018-08-07 LAB — BRAIN NATRIURETIC PEPTIDE: B Natriuretic Peptide: 501 pg/mL — ABNORMAL HIGH (ref 0.0–100.0)

## 2018-08-07 LAB — TRIGLYCERIDES: Triglycerides: 175 mg/dL — ABNORMAL HIGH (ref <150)

## 2018-08-07 LAB — MRSA PCR SCREENING: MRSA BY PCR: NEGATIVE

## 2018-08-07 LAB — TROPONIN I
Troponin I: 0.07 ng/mL (ref <0.03)
Troponin I: 0.15 ng/mL (ref <0.03)
Troponin I: 0.7 ng/mL (ref <0.03)

## 2018-08-07 LAB — PROCALCITONIN: Procalcitonin: 59.31 ng/mL

## 2018-08-07 MED ORDER — PANTOPRAZOLE SODIUM 40 MG IV SOLR
40.0000 mg | Freq: Every day | INTRAVENOUS | Status: DC
  Administered 2018-08-07 – 2018-08-09 (×3): 40 mg via INTRAVENOUS
  Filled 2018-08-07 (×3): qty 40

## 2018-08-07 MED ORDER — PIPERACILLIN-TAZOBACTAM 3.375 G IVPB
3.3750 g | Freq: Three times a day (TID) | INTRAVENOUS | Status: DC
  Filled 2018-08-07: qty 50

## 2018-08-07 MED ORDER — LACTATED RINGERS IV BOLUS
500.0000 mL | Freq: Once | INTRAVENOUS | Status: AC
  Administered 2018-08-07: 500 mL via INTRAVENOUS

## 2018-08-07 MED ORDER — NITROGLYCERIN 0.4 MG SL SUBL
SUBLINGUAL_TABLET | SUBLINGUAL | Status: AC
  Administered 2018-08-07: 0.4 mg
  Filled 2018-08-07: qty 1

## 2018-08-07 MED ORDER — FUROSEMIDE 10 MG/ML IJ SOLN
40.0000 mg | Freq: Once | INTRAMUSCULAR | Status: AC
  Administered 2018-08-07: 40 mg via INTRAVENOUS

## 2018-08-07 MED ORDER — INSULIN ASPART 100 UNIT/ML ~~LOC~~ SOLN
0.0000 [IU] | SUBCUTANEOUS | Status: DC
  Administered 2018-08-07: 1 [IU] via SUBCUTANEOUS
  Administered 2018-08-07: 2 [IU] via SUBCUTANEOUS
  Administered 2018-08-08 – 2018-08-09 (×8): 1 [IU] via SUBCUTANEOUS
  Administered 2018-08-09: 2 [IU] via SUBCUTANEOUS
  Administered 2018-08-09: 1 [IU] via SUBCUTANEOUS
  Administered 2018-08-10: 2 [IU] via SUBCUTANEOUS
  Administered 2018-08-10 (×2): 1 [IU] via SUBCUTANEOUS
  Administered 2018-08-10 (×2): 2 [IU] via SUBCUTANEOUS
  Administered 2018-08-10: 1 [IU] via SUBCUTANEOUS
  Administered 2018-08-11: 3 [IU] via SUBCUTANEOUS
  Administered 2018-08-11 (×3): 2 [IU] via SUBCUTANEOUS
  Administered 2018-08-11: 3 [IU] via SUBCUTANEOUS
  Administered 2018-08-11 – 2018-08-12 (×2): 2 [IU] via SUBCUTANEOUS
  Administered 2018-08-12 (×2): 1 [IU] via SUBCUTANEOUS

## 2018-08-07 MED ORDER — AZITHROMYCIN 200 MG/5ML PO SUSR
500.0000 mg | Freq: Every day | ORAL | Status: DC
  Administered 2018-08-07: 500 mg
  Filled 2018-08-07 (×3): qty 15

## 2018-08-07 MED ORDER — ENOXAPARIN SODIUM 30 MG/0.3ML ~~LOC~~ SOLN
30.0000 mg | SUBCUTANEOUS | Status: DC
  Administered 2018-08-07: 30 mg via SUBCUTANEOUS
  Filled 2018-08-07: qty 0.3

## 2018-08-07 MED ORDER — MIDAZOLAM HCL 2 MG/2ML IJ SOLN
2.0000 mg | INTRAMUSCULAR | Status: DC | PRN
  Administered 2018-08-08 – 2018-08-11 (×8): 2 mg via INTRAVENOUS
  Filled 2018-08-07 (×8): qty 2

## 2018-08-07 MED ORDER — SUCCINYLCHOLINE CHLORIDE 20 MG/ML IJ SOLN
20.0000 mg | Freq: Once | INTRAMUSCULAR | Status: AC
  Administered 2018-08-07: 20 mg via INTRAVENOUS

## 2018-08-07 MED ORDER — ASPIRIN 81 MG PO CHEW
81.0000 mg | CHEWABLE_TABLET | Freq: Every day | ORAL | Status: DC
  Administered 2018-08-08 – 2018-08-15 (×8): 81 mg via ORAL
  Filled 2018-08-07 (×8): qty 1

## 2018-08-07 MED ORDER — HEPARIN (PORCINE) 25000 UT/250ML-% IV SOLN
1200.0000 [IU]/h | INTRAVENOUS | Status: DC
  Administered 2018-08-08 (×2): 1200 [IU]/h via INTRAVENOUS
  Filled 2018-08-07 (×2): qty 250

## 2018-08-07 MED ORDER — FENTANYL 2500MCG IN NS 250ML (10MCG/ML) PREMIX INFUSION
0.0000 ug/h | INTRAVENOUS | Status: DC
  Administered 2018-08-07: 25 ug/h via INTRAVENOUS
  Administered 2018-08-08: 325 ug/h via INTRAVENOUS
  Administered 2018-08-08: 350 ug/h via INTRAVENOUS
  Administered 2018-08-08: 225 ug/h via INTRAVENOUS
  Administered 2018-08-09 – 2018-08-10 (×2): 50 ug/h via INTRAVENOUS
  Administered 2018-08-10: 200 ug/h via INTRAVENOUS
  Filled 2018-08-07 (×8): qty 250

## 2018-08-07 MED ORDER — ORAL CARE MOUTH RINSE
15.0000 mL | OROMUCOSAL | Status: DC
  Administered 2018-08-07 – 2018-08-11 (×38): 15 mL via OROMUCOSAL

## 2018-08-07 MED ORDER — FUROSEMIDE 10 MG/ML IJ SOLN
INTRAMUSCULAR | Status: AC
  Administered 2018-08-07: 40 mg via INTRAVENOUS
  Filled 2018-08-07: qty 4

## 2018-08-07 MED ORDER — PROPOFOL 1000 MG/100ML IV EMUL
INTRAVENOUS | Status: AC
  Filled 2018-08-07: qty 100

## 2018-08-07 MED ORDER — FENTANYL CITRATE (PF) 100 MCG/2ML IJ SOLN
100.0000 ug | INTRAMUSCULAR | Status: DC | PRN
  Administered 2018-08-07 – 2018-08-10 (×5): 100 ug via INTRAVENOUS

## 2018-08-07 MED ORDER — CHLORHEXIDINE GLUCONATE 0.12% ORAL RINSE (MEDLINE KIT)
15.0000 mL | Freq: Two times a day (BID) | OROMUCOSAL | Status: DC
  Administered 2018-08-07 – 2018-08-11 (×8): 15 mL via OROMUCOSAL

## 2018-08-07 MED ORDER — NITROGLYCERIN 0.4 MG SL SUBL
SUBLINGUAL_TABLET | SUBLINGUAL | Status: AC
  Administered 2018-08-07: 0.4 mg via SUBLINGUAL
  Filled 2018-08-07: qty 1

## 2018-08-07 MED ORDER — HEPARIN BOLUS VIA INFUSION
2000.0000 [IU] | Freq: Once | INTRAVENOUS | Status: AC
  Administered 2018-08-08: 2000 [IU] via INTRAVENOUS
  Filled 2018-08-07: qty 2000

## 2018-08-07 MED ORDER — PROPOFOL 1000 MG/100ML IV EMUL
5.0000 ug/kg/min | INTRAVENOUS | Status: DC
  Administered 2018-08-07: 30 ug/kg/min via INTRAVENOUS
  Administered 2018-08-07: 20 ug/kg/min via INTRAVENOUS
  Administered 2018-08-07: 40 ug/kg/min via INTRAVENOUS
  Filled 2018-08-07 (×3): qty 100

## 2018-08-07 MED ORDER — ETOMIDATE 2 MG/ML IV SOLN
40.0000 mg | Freq: Once | INTRAVENOUS | Status: AC
  Administered 2018-08-07: 40 mg via INTRAVENOUS

## 2018-08-07 NOTE — Significant Event (Addendum)
Rapid Response Event Note RN called for RD Overview: Time Called: 8003 Arrival Time: 0945 Event Type: Respiratory  Initial Focused Assessment: On arrival pt sitting upright in bed, moaning and grimacing while holding his chest, very restless and agitated, skin warm and dry, BP 179/86, HR 112, RR 28, SpO2 70% on NRB. Dr. Wynelle Cleveland at bedside, new orders given and completed. Plan to apply bipap repeat ABG in 1 hour F/U 1045 pt unable to tolerate Bipap, increased agitation and desating to 50-60%.  PCCM contacted, Dr. Rennis Harding to bedside. decision made to transfer to ICU and intubate immediately.   Interventions: ABG 7.13/65.4/47.0/21.3 EKG 20 g SL LPW Collected am labs pt had originally refused  80 mg Lasix IVP  Bipap   Event Summary: Name of Physician Notified: Dr. Wynelle Cleveland  at [phone]  Name of Consulting Physician Notified: Dr. Rennis Harding  at 1045  Outcome: Transferred (Comment)(65m10)  Event End Time: [place], [place]

## 2018-08-07 NOTE — Progress Notes (Signed)
ANTICOAGULATION CONSULT NOTE - Initial Consult  Pharmacy Consult for heparin Indication: chest pain/ACS  No Known Allergies  Patient Measurements: Height: _0  (188 cm) Weight: 220 lb (99.8 kg) IBW/kg (Calculated) : 82.2  Vital Signs: Temp: 97.6 F (36.4 C) (03/21 1938) Temp Source: Oral (03/21 1938) BP: 139/69 (03/21 2300) Pulse Rate: 56 (03/21 2300)  Labs: Recent Labs    08/05/18 0409 08/06/18 0430 08/07/18 1105 08/07/18 1330 08/07/18 1405 08/07/18 2111  HGB 9.5* 7.8* 10.2*  --  9.5*  --   HCT 31.1* 25.1* 33.1*  --  28.0*  --   PLT 231 210 370  --   --   --   CREATININE 2.55* 2.78* 2.33*  --   --   --   TROPONINI  --   --  0.07* 0.15*  --  0.70*    Estimated Creatinine Clearance: 36.7 mL/min (A) (by C-G formula based on SCr of 2.33 mg/dL (H)).   Medical History: Past Medical History:  Diagnosis Date  . Arthritis   . CAD in native artery    s/p stent in 11/18  . CHF (congestive heart failure) ([place])    normal echo in 11/18  . CKD (chronic kidney disease)   . DDD (degenerative disc disease), cervical   . Diabetes mellitus with complication (HCC)    Type II  . Diabetic neuropathy ([place])   . Diabetic retinopathy ([place])   . GERD (gastroesophageal reflux disease)   . Glaucoma   . Hypertension     Medications:  Medications Prior to Admission  Medication Sig Dispense Refill Last Dose  . acetaminophen (TYLENOL) 325 MG tablet Take 2 tablets (650 mg total) by mouth every 6 (six) hours as needed for mild pain or moderate pain (or Fever >/= 101).   Past Month at Unknown time  . albuterol (PROVENTIL HFA;VENTOLIN HFA) 108 (90 Base) MCG/ACT inhaler Inhale 2 puffs into the lungs every 6 (six) hours as needed for wheezing or shortness of breath. 1 Inhaler 2 Past Month at Unknown time  . ALPRAZolam (XANAX) 1 MG tablet Take 1 tablet (1 mg total) by mouth 2 (two) times daily as needed for anxiety. 6 tablet 0 Past Week at Unknown time  . amLODipine (NORVASC) 10 MG tablet Take  1 tablet (10 mg total) by mouth daily.   08/04/2018 at Unknown time  . aspirin EC 81 MG tablet Take 81 mg by mouth daily.   08/04/2018 at Unknown time  . atorvastatin (LIPITOR) 80 MG tablet Take 1 tablet (80 mg total) by mouth at bedtime. 90 tablet 3 08/04/2018 at Unknown time  . dorzolamide-timolol (COSOPT) 22.3-6.8 MG/ML ophthalmic solution Place 1 drop into both eyes 2 (two) times daily.  98 08/04/2018 at Unknown time  . ferrous sulfate 325 (65 FE) MG tablet Take 325 mg by mouth daily with breakfast.   08/04/2018 at Unknown time  . gabapentin (NEURONTIN) 600 MG tablet Take 600 mg by mouth 2 (two) times daily.    08/04/2018 at Unknown time  . HUMALOG KWIKPEN 100 UNIT/ML KiwkPen Inject 4 Units into the skin 3 (three) times daily.  4 08/04/2018 at Unknown time  . HYDROcodone-acetaminophen (NORCO) 10-325 MG tablet Take 1 tablet by mouth every 6 (six) hours as needed for moderate pain.   08/04/2018 at Unknown time  . Insulin Detemir (LEVEMIR FLEXTOUCH) 100 UNIT/ML Pen Inject 20 Units into the skin daily.   08/04/2018 at Unknown time  . isosorbide-hydrALAZINE (BIDIL) 20-37.5 MG tablet Take 1 tablet by mouth  3 (three) times daily. (Patient taking differently: Take 2 tablets by mouth 3 (three) times daily. ) 90 tablet 0 08/04/2018 at Unknown time  . methocarbamol (ROBAXIN) 750 MG tablet Take 1 tablet (750 mg total) by mouth every 6 (six) hours as needed for muscle spasms. 30 tablet 0 Past Month at Unknown time  . metoprolol succinate (TOPROL-XL) 100 MG 24 hr tablet TAKE 1 TABLET BY MOUTH EVERY DAY (Patient taking differently: Take 100 mg by mouth daily. ) 30 tablet 5 08/04/2018 at 0800  . saccharomyces boulardii (FLORASTOR) 250 MG capsule Take 1 capsule (250 mg total) by mouth 2 (two) times daily.   08/04/2018 at Unknown time  . torsemide (DEMADEX) 20 MG tablet Take 10 mg by mouth daily.   08/04/2018 at Unknown time  . fluticasone (FLONASE) 50 MCG/ACT nasal spray Place 1 spray into both nostrils daily. (Patient not  taking: Reported on 08/05/2018) 16 g 1 Not Taking at Unknown time  . mupirocin ointment (BACTROBAN) 2 % Apply 1 application topically daily. (Patient not taking: Reported on 08/05/2018) 22 g 0 Completed Course at Unknown time   Scheduled:  . [START ON 08/08/2018] aspirin  81 mg Oral Daily  . atorvastatin  80 mg Oral QHS  . azithromycin  500 mg Per Tube Daily  . chlorhexidine gluconate (MEDLINE KIT)  15 mL Mouth Rinse BID  . dicyclomine  10 mg Oral TID AC & HS  . dorzolamide-timolol  1 drop Both Eyes BID  . insulin aspart  0-9 Units Subcutaneous Q4H  . isosorbide-hydrALAZINE  1 tablet Oral TID  . mouth rinse  15 mL Mouth Rinse 10 times per day  . metoprolol succinate  100 mg Oral Daily  . pantoprazole (PROTONIX) IV  40 mg Intravenous Daily  . sodium chloride flush  3 mL Intravenous Q12H   Infusions:  . propofol (DIPRIVAN) infusion 40 mcg/kg/min (08/07/18 2300)  . propofol      Assessment: 73yo male admitted 3/19 w/ PNA/sepsis and r/o Covid-19, now w/ positive and increasing troponins w/ abnormalities on EKG >> to start heparin.  Goal of Therapy:  Heparin level 0.3-0.7 units/ml Monitor platelets by anticoagulation protocol: Yes   Plan:  Rec'd LMWH DVT Px this afternoon; will give small heparin bolus of 2000 units x1 followed by gtt at 1200 units/hr and monitor heparin levels and CBC.  Wynona Neat, PharmD, BCPS  08/07/2018,11:31 PM

## 2018-08-07 NOTE — Progress Notes (Signed)
[facility] Progress Note Patient Name: David [name] DOB: September 02, 1945 MRN: [id number]   Date of Service  08/07/2018  HPI/Events of Note  Bradycardia - HR = 54 (HR was 103 earlier today). Likely d/t Propofol IV infusion.   eICU Interventions  Will order: 1. Will D/C Propofol IV infusion. 2. Fentanyl IV infusion. Titrate to RASS = 0.      Intervention Category Major Interventions: Arrhythmia - evaluation and management  Elainna Eshleman Cornelia Copa 08/07/2018, 11:35 PM

## 2018-08-07 NOTE — Plan of Care (Signed)
  Problem: Clinical Measurements: Goal: Ability to maintain clinical measurements within normal limits will improve Outcome: Not Progressing Goal: Will remain free from infection Outcome: Not Progressing Goal: Diagnostic test results will improve Outcome: Not Progressing Goal: Respiratory complications will improve Outcome: Not Progressing Goal: Cardiovascular complication will be avoided Outcome: Not Progressing  Patient experienced decline in respiratory status, requiring transfer to higher level of care and eventual intubation.  Wife kept apprised of status.  She is concerned that his high level of anxiety will provoke future episodes.

## 2018-08-07 NOTE — Progress Notes (Signed)
Pharmacy Antibiotic Note  UTAH David [name] is a 73 y.o. male admitted on 08/05/2018 with pneumonia and sepsis.  Pharmacy has been consulted for zosyn dosing. Pt is currently afebrile and WBC is significantly elevated at 31.9. SCr is elevated at 2.33 but down from yesterday.   Plan: Zosyn 3.375gm IV Q8H (4 hr inf) F/u renal fxn, C&S, clinical status   Height: 6\' 2"  (188 cm) Weight: 220 lb (99.8 kg) IBW/kg (Calculated) : 82.2  Temp (24hrs), Avg:98.3 F (36.8 C), Min:97.6 F (36.4 C), Max:98.8 F (37.1 C)  Recent Labs  Lab 08/05/18 0409 08/06/18 0430 08/07/18 1105  WBC 11.9* 15.4* 31.9*  CREATININE 2.55* 2.78* 2.33*  LATICACIDVEN 1.9  --   --     Estimated Creatinine Clearance: 36.7 mL/min (A) (by C-G formula based on SCr of 2.33 mg/dL (H)).    No Known Allergies  Antimicrobials this admission: 3/19 Vanc >> 3/19 3/19 Cefepime >> 3/19 CTX 3/19 >>3/21 Azith 3/19>>(3/26) Lajean Silvius 3/21>>  Dose adjustments this admission: N/A  Microbiology results: Pending  Thank you for allowing pharmacy to be a part of this patient's care.  Erykah Lippert, Rande Lawman 08/07/2018 12:48 PM

## 2018-08-07 NOTE — Progress Notes (Signed)
Pt did not tolerate BiPAP very long.  While on the BiPaP sp02 was 100% on 60%.  He became very agitated and ripped it off.  Placed pt back on NRB at 100%.  Sp02 87%

## 2018-08-07 NOTE — Progress Notes (Addendum)
PROGRESS NOTE    David [name]   DDU:[id number]  DOB: [birth year]  DOA: 08/05/2018 PCP: [name], MD   Brief Narrative:  David [name] is a 73 y/o with PMH for CAD, diastolic CHF, severe pulm HTN, DM2, CKD 3 who presents to the hospital for fever, sever fatigue and is found to have a fever of 104.9, CXR with b/l infiltrates and AKI.    Subjective: Complaining of chest pressure today when he walked to the bathroom. When he returned to bed pulse ox noted to be in 60s on room air. He was placed on a non-rebreather mask and an EKG was attempted for his chest pain but he was agitated and trashing and thus it could not be performed. He had no IV (had pulled it out in the bathroom somehow) and thus 2 IVs were placed.  An ABG was ordered and Nitroglycerin was given for his chest pain. CXR done this AM showed b/l infiltrates, CHF vs pneumonia.  Lasix 40 mg IV STAT and another dose was given 30 min later but he did not have any urine output.  ABG > pH 7.137, PCO2 65.4, PO2 47.0. BiPAP was ordered but the patient did not tolerate it and thus PCCM was called to intubate him.  He had refused labs this AM and lab eventually came back to re-draw them.     Assessment & Plan:   Principal Problem:   Bilateral Pneumonia - sepsis- fever 104/ leukocytosis/ tachycardia  Acute respiratory failure - being treated with Azithromycin and Ceftriaxone- change Ceftriaxone to Zosyn - checking for COVID 19 which is still pending - PCCM consulted for respiratory failure and need for intubation - ? Fluid overloaded- have given him Lasix- follow - likely needs a CT scan of his chest  Active Problems:   AKI (acute kidney injury)-   Chronic heart failure with preserved ejection fraction (HFpEF)  - ? If AKI was prerenal as he has been on Demadex at home and recently the dose was cut back by Dr [name]- follow- he is urinating well - due to respiratory distress this AM, IVF were stopped and Lasix 80 mg IV given   Severe agitation - restrains ordered to prevent him from pulling out IVs and non rebreather mask    Insulin-requiring or dependent type II diabetes mellitus  - cont Levemir and SSI    Bilateral carotid artery stenosis   Coronary artery disease involving native coronary artery of native heart without angina pectoris - on Aspirin and Statin   Time spent in minutes: 35 DVT prophylaxis:  SCD Code Status: Full code Family Communication: no family at bedside Disposition Plan: transfer to ICU Consultants:   PCCM called Procedures:   none Antimicrobials:  Anti-infectives (From admission, onward)   Start     Dose/Rate Route Frequency Ordered Stop   08/06/18 0600  vancomycin (VANCOCIN) 1,750 mg in sodium chloride 0.9 % 500 mL IVPB  Status:  Discontinued     1,750 mg 250 mL/hr over 120 Minutes Intravenous Every 24 hours 08/05/18 0434 08/05/18 1049   08/05/18 1700  ceFEPIme (MAXIPIME) 2 g in sodium chloride 0.9 % 100 mL IVPB  Status:  Discontinued     2 g 200 mL/hr over 30 Minutes Intravenous Every 12 hours 08/05/18 0434 08/05/18 1049   08/05/18 1230  azithromycin (ZITHROMAX) tablet 500 mg     500 mg Oral Every 24 hours 08/05/18 1049 08/12/18 1229   08/05/18 1200  cefTRIAXone (ROCEPHIN) 2 g in sodium  chloride 0.9 % 100 mL IVPB     2 g 200 mL/hr over 30 Minutes Intravenous Every 24 hours 08/05/18 1049 08/12/18 1159   08/05/18 0415  ceFEPIme (MAXIPIME) 2 g in sodium chloride 0.9 % 100 mL IVPB     2 g 200 mL/hr over 30 Minutes Intravenous  Once 08/05/18 0404 08/05/18 0447   08/05/18 0415  metroNIDAZOLE (FLAGYL) IVPB 500 mg     500 mg 100 mL/hr over 60 Minutes Intravenous  Once 08/05/18 0404 08/05/18 0520   08/05/18 0415  vancomycin (VANCOCIN) IVPB 1000 mg/200 mL premix  Status:  Discontinued     1,000 mg 200 mL/hr over 60 Minutes Intravenous  Once 08/05/18 0404 08/05/18 0407   08/05/18 0415  vancomycin (VANCOCIN) 2,000 mg in sodium chloride 0.9 % 500 mL IVPB     2,000 mg 250 mL/hr  over 120 Minutes Intravenous  Once 08/05/18 0407 08/05/18 0923       Objective: Vitals:   08/07/18 0749 08/07/18 0751 08/07/18 0838 08/07/18 1050  BP: (!) 138/123 (!) 157/82 (!) 173/79 (!) 179/86  Pulse: 88 88  (!) 103  Resp: (!) 26     Temp: 98.3 F (36.8 C)     TempSrc: Oral     SpO2: (!) 83% 100% 94% (!) 85%  Weight:      Height:       No intake or output data in the 24 hours ending 08/07/18 1132 Filed Weights   08/05/18 0336  Weight: 99.8 kg    Examination: General exam: Appears agitated and uncomfortable  HEENT: PERRLA, oral mucosa moist, no sclera icterus or thrush Respiratory system: difficult to auscultate- the patient is constantly moaning. Cardiovascular system: S1 & S2 heard,    Gastrointestinal system: Abdomen soft, non-tender, nondistended. Normal bowel sounds   Central nervous system: Alert    Extremities: No cyanosis, clubbing or edema Skin: No rashes or ulcers Psychiatry:  very agitated  Data Reviewed: I have personally reviewed following labs and imaging studies  CBC: Recent Labs  Lab 08/05/18 0409 08/06/18 0430  WBC 11.9* 15.4*  NEUTROABS 11.1*  --   HGB 9.5* 7.8*  HCT 31.1* 25.1*  MCV 112.7* 111.1*  PLT 231 417   Basic Metabolic Panel: Recent Labs  Lab 08/05/18 0409 08/06/18 0430  NA 136 139  K 4.0 4.2  CL 105 112*  CO2 19* 22  GLUCOSE 193* 150*  BUN 24* 31*  CREATININE 2.55* 2.78*  CALCIUM 8.1* 7.8*   GFR: Estimated Creatinine Clearance: 30.7 mL/min (A) (by C-G formula based on SCr of 2.78 mg/dL (H)). Liver Function Tests: Recent Labs  Lab 08/05/18 0409 08/06/18 0430  AST 29 31  ALT 18 25  ALKPHOS 147* 104  BILITOT 1.2 0.7  PROT 6.8 6.2*  ALBUMIN 2.8* 2.3*   No results for input(s): LIPASE, AMYLASE in the last 168 hours. No results for input(s): AMMONIA in the last 168 hours. Coagulation Profile: No results for input(s): INR, PROTIME in the last 168 hours. Cardiac Enzymes: No results for input(s): CKTOTAL, CKMB,  CKMBINDEX, TROPONINI in the last 168 hours. BNP (last 3 results) No results for input(s): PROBNP in the last 8760 hours. HbA1C: No results for input(s): HGBA1C in the last 72 hours. CBG: Recent Labs  Lab 08/06/18 0813 08/06/18 1142 08/06/18 1608 08/06/18 2256 08/07/18 0744  GLUCAP 123* 108* 119* 147* 119*   Lipid Profile: No results for input(s): CHOL, HDL, LDLCALC, TRIG, CHOLHDL, LDLDIRECT in the last 72 hours. Thyroid Function Tests:  No results for input(s): TSH, T4TOTAL, FREET4, T3FREE, THYROIDAB in the last 72 hours. Anemia Panel: No results for input(s): VITAMINB12, FOLATE, FERRITIN, TIBC, IRON, RETICCTPCT in the last 72 hours. Urine analysis:    Component Value Date/Time   COLORURINE AMBER (A) 08/05/2018 0419   APPEARANCEUR HAZY (A) 08/05/2018 0419   LABSPEC 1.019 08/05/2018 0419   PHURINE 5.0 08/05/2018 0419   GLUCOSEU NEGATIVE 08/05/2018 0419   HGBUR MODERATE (A) 08/05/2018 0419   BILIRUBINUR NEGATIVE 08/05/2018 0419   KETONESUR NEGATIVE 08/05/2018 0419   PROTEINUR 100 (A) 08/05/2018 0419   UROBILINOGEN 1.0 02/13/2015 1934   NITRITE NEGATIVE 08/05/2018 0419   LEUKOCYTESUR NEGATIVE 08/05/2018 0419   Sepsis Labs: @LABRCNTIP (procalcitonin:4,lacticidven:4) ) Recent Results (from the past 240 hour(s))  Blood Culture (routine x 2)     Status: None (Preliminary result)   Collection Time: 08/05/18  4:09 AM  Result Value Ref Range Status   Specimen Description BLOOD LEFT ANTECUBITAL  Final   Special Requests   Final    BOTTLES DRAWN AEROBIC AND ANAEROBIC Blood Culture adequate volume   Culture  Setup Time   Final    GRAM POSITIVE COCCI ANAEROBIC BOTTLE ONLY CRITICAL RESULT CALLED TO, READ BACK BY AND VERIFIED WITH: [place], [place] [id number] FCP Performed at [facility], [place] [address] [place], [place] [address]    Culture GRAM POSITIVE COCCI  Final   Report Status PENDING  Incomplete  Blood Culture (routine x 2)     Status: None (Preliminary result)    Collection Time: 08/05/18  4:09 AM  Result Value Ref Range Status   Specimen Description BLOOD RIGHT ARM  Final   Special Requests   Final    BOTTLES DRAWN AEROBIC AND ANAEROBIC Blood Culture adequate volume   Culture   Final    NO GROWTH 2 DAYS Performed at [facility], [address], [place],  [address]    Report Status PENDING  Incomplete  Blood Culture ID Panel (Reflexed)     Status: Abnormal   Collection Time: 08/05/18  4:09 AM  Result Value Ref Range Status   Enterococcus species NOT DETECTED NOT DETECTED Final   Listeria monocytogenes NOT DETECTED NOT DETECTED Final   Staphylococcus species NOT DETECTED NOT DETECTED Final   Staphylococcus aureus (BCID) NOT DETECTED NOT DETECTED Final   Streptococcus species DETECTED (A) NOT DETECTED Final    Comment: Not Enterococcus species, Streptococcus agalactiae, Streptococcus pyogenes, or Streptococcus pneumoniae. CRITICAL RESULT CALLED TO, READ BACK BY AND VERIFIED WITH: PHARMD TURNER, M [id number] FCP    Streptococcus agalactiae NOT DETECTED NOT DETECTED Final   Streptococcus pneumoniae NOT DETECTED NOT DETECTED Final   Streptococcus pyogenes NOT DETECTED NOT DETECTED Final   Acinetobacter baumannii NOT DETECTED NOT DETECTED Final   Enterobacteriaceae species NOT DETECTED NOT DETECTED Final   Enterobacter cloacae complex NOT DETECTED NOT DETECTED Final   Escherichia coli NOT DETECTED NOT DETECTED Final   Klebsiella oxytoca NOT DETECTED NOT DETECTED Final   Klebsiella pneumoniae NOT DETECTED NOT DETECTED Final   Proteus species NOT DETECTED NOT DETECTED Final   Serratia marcescens NOT DETECTED NOT DETECTED Final   Haemophilus influenzae NOT DETECTED NOT DETECTED Final   Neisseria meningitidis NOT DETECTED NOT DETECTED Final   Pseudomonas aeruginosa NOT DETECTED NOT DETECTED Final   Candida albicans NOT DETECTED NOT DETECTED Final   Candida glabrata NOT DETECTED NOT DETECTED Final   Candida krusei NOT DETECTED NOT  DETECTED Final   Candida parapsilosis  NOT DETECTED NOT DETECTED Final   Candida tropicalis NOT DETECTED NOT DETECTED Final    Comment: Performed at [facility], [place] [address], [place], [place] [address]  Urine culture     Status: Abnormal   Collection Time: 08/05/18  4:20 AM  Result Value Ref Range Status   Specimen Description URINE, RANDOM  Final   Special Requests NONE  Final   Culture (A)  Final    <10,000 COLONIES/mL INSIGNIFICANT GROWTH Performed at [facility], [place] [address] [place], [place] [address]    Report Status 08/06/2018 FINAL  Final         Radiology Studies: Dg Chest Port 1 View  Result Date: 08/07/2018 CLINICAL DATA:  Pneumonia. EXAM: PORTABLE CHEST 1 VIEW COMPARISON:  08/05/2018 FINDINGS: Lungs are adequately inflated demonstrate patchy hazy airspace opacification worse over the left midlung as there is slight interval worsening. Findings may be due to pneumonia versus asymmetric edema. Mild stable cardiomegaly. Remainder of the exam is unchanged. IMPRESSION: Slight interval worsening of hazy patchy bilateral airspace opacification worse over the left midlung as findings may be due to asymmetric edema versus pneumonia. Stable cardiomegaly. Electronically Signed   By: Marin Olp M.D.   On: 08/07/2018 09:01      Scheduled Meds: . aspirin EC  81 mg Oral Daily  . atorvastatin  80 mg Oral QHS  . azithromycin  500 mg Oral Q24H  . dicyclomine  10 mg Oral TID AC & HS  . dorzolamide-timolol  1 drop Both Eyes BID  . feeding supplement  1 Container Oral TID BM  . insulin aspart  0-5 Units Subcutaneous QHS  . insulin aspart  0-9 Units Subcutaneous TID WC  . insulin detemir  10 Units Subcutaneous Daily  . isosorbide-hydrALAZINE  1 tablet Oral TID  . metoprolol succinate  100 mg Oral Daily  . sodium chloride flush  3 mL Intravenous Q12H   Continuous Infusions: . cefTRIAXone (ROCEPHIN)  IV 2 g (08/06/18 1206)     LOS: 2 days      Debbe Odea, MD [facility] Pager: [email or web address] Password [facility] 08/07/2018, 11:32 AM

## 2018-08-07 NOTE — Consult Note (Signed)
PULMONARY / CRITICAL CARE MEDICINE   NAME:  David [name], MRN:  [id number], DOB:  01/23/1946, LOS: 2 ADMISSION DATE:  08/05/2018, CONSULTATION DATE:  08/07/2018 REFERRING MD:  Reggy Eye. CHIEF COMPLAINT:  Acute hypoxemic, hypercapneic respiratory failure  BRIEF HISTORY:    This is a 74y. o B M with a PMH CAD-es/p stent placement, diastolic CHF, pulm HTN, DM type 2, CKD III. He presented to the ED 3/19 with with fever up to 104.9, generalized weakness, chills and a dry cough within the 24 hours pta. He denied headache, chest pain, abdominal pain, cough, shortness of breath, vomiting, diarrhea, or rash.  However, the COVID screen did report a dry cough within the 12 hours pta. There were no known sick contacts or recent travel; did have contact with individuals traveling from [place].  Did have a flu vaccination this year. In the ED he was treated for presumptive SIRS and iniitated on Rocephin, Zithromax and vanc 2 g x 1. Procalcitonin was 1.28 on adm and has risen to 59.31 today (3/21). WBC has risen from 15.4 on admission to 31.9 today. Initial PCXR (personally reviewed) was somewhat underinflated, but no definitive interstitial or acinar infiltrates and O2 sat was 96%. HISTORY OF PRESENT ILLNESS   As above SIGNIFICANT PAST MEDICAL HISTORY   CAD, es/p stent 11/18 CKD Type II DM  SIGNIFICANT EVENTS:  The patient had improved somewhat yesterday but late this AM became more dyspneic and hypoxemic. He was tried on BiPAP with initial correction of hypoxemia (Sat was 100% on 60% FIO2).  However, he became very agitated and ripped it off.  He was placed back on NRB at 100% with progressive falls in his Sp02. Repeat ABGs were 67/47/65/7.14. PCCM was called for consultation. Upon my arrival the patietn was acutely dyspneic and agitated with O2 sat fluctuating between high 70s-low 80s. PCXR from 08:55 today showed slight increase in interstitial infiltrates. He was tx to the ICU for urgent intubation. At  no time was the patient hypotensive. STUDIES:   Post intubation PCXR - bilateral infiltrates c/w ARDS Influenza A/B - neg CULTURES:  BC 3/18: R arm NG after 2 d; Left antecubital - S. viridans in anaerobic bottle only COVID-19: Pending  ANTIBIOTICS:  Rocephin 3/19>> Zithromax 3/19>> Vanc 3/19 x1  LINES/TUBES:   ETT 3/21 OGT 3/21 CONSULTANTS:  None SUBJECTIVE:  Intubated/sedated  CONSTITUTIONAL: BP (!) 179/86 (BP Location: Left Arm)   Pulse (!) 103   Temp 98.3 F (36.8 C) (Oral)   Resp (!) 26   Ht '6\' 2"'  (1.88 m)   Wt 99.8 kg   SpO2 (!) 85%   BMI 28.25 kg/m   I/O last 3 completed shifts: In: 68 [P.O.:360; IV Piggyback:100] Out: -         PHYSICAL EXAM: General:  WD slightly overweight BM now sedated and intubated Neuro:  Non-focal HEENT:  [place]/AT; PRRL; O-P neg with ETT/OGT now in place Cardiovascular:  Tachy but regular. No m/r/g Lungs:  Clear bilaterally Abdomen:  Protuberant. No guarding Musculoskeletal:  No active joints  Skin:  No c/c/e  RESOLVED PROBLEM LIST   ASSESSMENT AND PLAN   1) ARDS. Has hypoxemic and hypercapneic respiratory failure. Post-intubation PCXR  Now with definitive bilateral interstitial and alveolar infiltrates. Clinical deterioration not compatible with sepsis as patient never hypotensive. As patient does have chronic conditions that can impair immune function, COVID-19 must be considered pending test result. Will obtain WBC diff to see if leukocytosis is lymphocytic-driven. Willa lso send trach  aspirate for culture. Consider d/c'ing current Abx if final cultures are unremarkable. ARDS ventilator protocol/bundle  2) Type II DM. Has elevated sugars. Place on SSI  3) CKD. Creat slightly lower since admission. Maintenance fluids  4) Macrocytic anemia. Will check B12 and folate  5) Increasing alk phos/ bili. Transaminases wnl and patient had no c/o abd pain prior to decompensation. Will trend; if further rising will scan.  6)  Increased cTn and BNP. 0.07 and 501, respectively. Will obtain ECG and serial cTn    SUMMARY OF TODAY'S PLAN:  As above  Best Practice / Goals of Care / Disposition.   DVT PROPHYLAXIS:SCD/Lovenox NUTRITION:NPO MOBILITY:BR GOALS OF CARE:Supportive FAMILY DISCUSSIONS: family not present DISPOSITION ICU  LABS  Glucose Recent Labs  Lab 08/05/18 2153 08/06/18 0813 08/06/18 1142 08/06/18 1608 08/06/18 2256 08/07/18 0744  GLUCAP 162* 123* 108* 119* 147* 119*    BMET Recent Labs  Lab 08/05/18 0409 08/06/18 0430  NA 136 139  K 4.0 4.2  CL 105 112*  CO2 19* 22  BUN 24* 31*  CREATININE 2.55* 2.78*  GLUCOSE 193* 150*    Liver Enzymes Recent Labs  Lab 08/05/18 0409 08/06/18 0430  AST 29 31  ALT 18 25  ALKPHOS 147* 104  BILITOT 1.2 0.7  ALBUMIN 2.8* 2.3*    Electrolytes Recent Labs  Lab 08/05/18 0409 08/06/18 0430  CALCIUM 8.1* 7.8*    CBC Recent Labs  Lab 08/05/18 0409 08/06/18 0430  WBC 11.9* 15.4*  HGB 9.5* 7.8*  HCT 31.1* 25.1*  PLT 231 210    ABG Recent Labs  Lab 08/07/18 1005  PHART 7.137*  PCO2ART 65.4*  PO2ART 47.0*    Coag's No results for input(s): APTT, INR in the last 168 hours.  Sepsis Markers Recent Labs  Lab 08/05/18 0409  LATICACIDVEN 1.9  PROCALCITON 1.28    Cardiac Enzymes No results for input(s): TROPONINI, PROBNP in the last 168 hours.  PAST MEDICAL HISTORY :   He  has a past medical history of Arthritis, CAD in native artery, CHF (congestive heart failure) ([place]), CKD (chronic kidney disease), DDD (degenerative disc disease), cervical, Diabetes mellitus with complication ([place]), Diabetic neuropathy ([place]), Diabetic retinopathy ([place]), GERD (gastroesophageal reflux disease), Glaucoma, and Hypertension.  PAST SURGICAL HISTORY:  He  has a past surgical history that includes Joint replacement; Cyst excision; Total hip arthroplasty; Back surgery; Amputation (Right, 07/09/2016); RIGHT/LEFT HEART CATH AND CORONARY  ANGIOGRAPHY (N/A, 04/06/2017); CORONARY STENT INTERVENTION (N/A, 04/06/2017); CORONARY STENT INTERVENTION (N/A, 04/07/2017); Cardiac catheterization; Laparoscopic abdominal exploration (N/A, 11/26/2017); Amputation (Right, 05/28/2018); and Toe Surgery (05/2018).  No Known Allergies  No current facility-administered medications on file prior to encounter.    Current Outpatient Medications on File Prior to Encounter  Medication Sig  . acetaminophen (TYLENOL) 325 MG tablet Take 2 tablets (650 mg total) by mouth every 6 (six) hours as needed for mild pain or moderate pain (or Fever >/= 101).  Marland Kitchen albuterol (PROVENTIL HFA;VENTOLIN HFA) 108 (90 Base) MCG/ACT inhaler Inhale 2 puffs into the lungs every 6 (six) hours as needed for wheezing or shortness of breath.  . ALPRAZolam (XANAX) 1 MG tablet Take 1 tablet (1 mg total) by mouth 2 (two) times daily as needed for anxiety.  Marland Kitchen amLODipine (NORVASC) 10 MG tablet Take 1 tablet (10 mg total) by mouth daily.  Marland Kitchen aspirin EC 81 MG tablet Take 81 mg by mouth daily.  Marland Kitchen atorvastatin (LIPITOR) 80 MG tablet Take 1 tablet (80 mg total) by mouth at bedtime.  Marland Kitchen  dorzolamide-timolol (COSOPT) 22.3-6.8 MG/ML ophthalmic solution Place 1 drop into both eyes 2 (two) times daily.  . ferrous sulfate 325 (65 FE) MG tablet Take 325 mg by mouth daily with breakfast.  . gabapentin (NEURONTIN) 600 MG tablet Take 600 mg by mouth 2 (two) times daily.   Marland Kitchen HUMALOG KWIKPEN 100 UNIT/ML KiwkPen Inject 4 Units into the skin 3 (three) times daily.  Marland Kitchen HYDROcodone-acetaminophen (NORCO) 10-325 MG tablet Take 1 tablet by mouth every 6 (six) hours as needed for moderate pain.  . Insulin Detemir (LEVEMIR FLEXTOUCH) 100 UNIT/ML Pen Inject 20 Units into the skin daily.  . isosorbide-hydrALAZINE (BIDIL) 20-37.5 MG tablet Take 1 tablet by mouth 3 (three) times daily. (Patient taking differently: Take 2 tablets by mouth 3 (three) times daily. )  . methocarbamol (ROBAXIN) 750 MG tablet Take 1 tablet (750 mg  total) by mouth every 6 (six) hours as needed for muscle spasms.  . metoprolol succinate (TOPROL-XL) 100 MG 24 hr tablet TAKE 1 TABLET BY MOUTH EVERY DAY (Patient taking differently: Take 100 mg by mouth daily. )  . saccharomyces boulardii (FLORASTOR) 250 MG capsule Take 1 capsule (250 mg total) by mouth 2 (two) times daily.  Marland Kitchen torsemide (DEMADEX) 20 MG tablet Take 10 mg by mouth daily.  . fluticasone (FLONASE) 50 MCG/ACT nasal spray Place 1 spray into both nostrils daily. (Patient not taking: Reported on 08/05/2018)  . mupirocin ointment (BACTROBAN) 2 % Apply 1 application topically daily. (Patient not taking: Reported on 08/05/2018)    FAMILY HISTORY:   His family history includes Alzheimer's disease in his father and another family member; Diabetes in an other family member; Diabetes Mellitus II in his father; Hyperlipidemia in an other family member; Hypertension in an other family member; Stroke in an other family member; Thyroid disease in his mother.  SOCIAL HISTORY:  He  reports that he has never smoked. He has never used smokeless tobacco. He reports current alcohol use. He reports that he does not use drugs.  REVIEW OF SYSTEMS:    As per ED note/HPI  Addendum: At ~13:15, the patient was noted to have a BP of 80/53 with pulse 56. Will give fluid bolus.   Critical care time: 60 min

## 2018-08-07 NOTE — Progress Notes (Signed)
Dr. Wynelle Cleveland present and gave verbal order for restraints x 4 to wrists and ankles with siderails up x 4 due to increased patient confusion and agitation with attempts to pull out lines / pull off O2, thus interfering with his care.

## 2018-08-07 NOTE — Progress Notes (Signed)
Patient having increased SOB and restlessness.  Very anxious and diaphoretic.  States continues to have "burning" in chest.  Unable to obtain EKG due to patient's increased restlessness and inability to follow commands.  MD aware.  Patient in obvious distress.  Coarse rhonchi auscultated Bilateral lower lung fields.  Fine crackles auscultated in Bilateral upper lung fields.  Respiratory notified.  Rapid response also notified of change in patient's status.    EKG ultimately obtained.  No change from previous.

## 2018-08-07 NOTE — Progress Notes (Signed)
Called to room by patient, who was screaming "help" from bathroom.  IV had dislodged and he was bleeding, but not significantly.  Patient given bath while in BR and blood cleaned using hospital protocol.  Patient complaining of SOB, and "burning in my chest".  STAT EKG will be obtained.  Dr. Wynelle Cleveland aware of patient's status.  Patient refusing bloodwork, but allowed CXR to be completed.  States he "wants to get out of this room".  Moaning continuously.  10 am meds given early with Xanax and hydrocodone.

## 2018-08-07 NOTE — Procedures (Signed)
Procedure:  The patient was pre-medicated with etomidate and paralyzed with succinylcholine. He was then intubated with a 7.5 ETT via the GlideScope and visualization of the ETT passing through the cords. Confirmation was via CO2 monitor and audible breath sounds. ETT placement confirmed on post-procedure PCXR.

## 2018-08-07 NOTE — Progress Notes (Signed)
David [name] DOB: [birth year] MRN: [id number]   Date of Service  08/07/2018  HPI/Events of Note  Troponin = 0.07 --> 0.15 --> 0.7. EKG reveals sinus bradycardia ST & T wave abnormality, consider anterior ischemia Prolonged QT. BP = 139/69 and HR = 56. Already on ASA. Can't B-Block d/t bradycardia.  eICU Interventions  Will order: 1. Heparin for ACS per pharmacy consult.  2. Continue to cycle Troponin.      Intervention Category Intermediate Interventions: Diagnostic test evaluation  [name],[name] 08/07/2018, 11:15 PM

## 2018-08-07 NOTE — Progress Notes (Signed)
Nutrition Follow-up  INTERVENTION:   If unable to extubate pt within 24-48 hours, recommend intiation of enteral nutrition: - Vital AF 1.2 @ 50 ml/hr (1200 ml/day) - Pro-stat 30 ml daily  Tube feeding regimen provides 1540 kcal, 105 grams of protein, and 973 ml of H2O.   Tube feeding regimen and current propofol provides 2182 total kcal (98% of kcal needs).  NUTRITION DIAGNOSIS:   Increased nutrient needs related to acute illness as evidenced by estimated needs.  Ongoing  GOAL:   Patient will meet greater than or equal to 90% of their needs  Unmet, pt intubated and sedated and no TF at this time  MONITOR:   PO intake, Supplement acceptance, Labs, Skin, Weight trends, I & O's  REASON FOR ASSESSMENT:   Malnutrition Screening Tool    ASSESSMENT:   73 yo Male with PMH CAD, diastolic CHF, pulm HTN, DM type 2, CKD III; presented with fever up to 104.9, generalized weakness and dry cough within last 24 hours.  Pt admitted with PNA, AKI, and fever.  Pt intubated this morning and transferred to the ICU. Pt with ARDS. COVID-19 test pending.  Patient is currently intubated on ventilator support. OG tube tip "at the level of the pylorus or proximal duodenum," currently to low intermittent suction. MV: 15.9 L/min Temp (24hrs), Avg:98.3 F (36.8 C), Min:97.6 F (36.4 C), Max:98.8 F (37.1 C)  Propofol: 24.3 ml/hr (provides 642 kcal daily)  Medications reviewed and include: SSI, Levemir 10 units daily, Protonix  Labs reviewed: BUN 27 (H), creatinine 2.33 (H), hemoglobin 9.5 (L) CBG's: 166, 119, 147, 119 x 24 hours  NUTRITION - FOCUSED PHYSICAL EXAM:  Unable to complete at this time as pt is being ruled out for COVID-19.  Diet Order:   Diet Order            Diet NPO time specified  Diet effective now              EDUCATION NEEDS:   Not appropriate for education at this time  Skin:  Skin Assessment: Skin Integrity Issues: Diabetic Ulcer: right foot  Last  BM:  3/21 large type 6  Height:   Ht Readings from Last 1 Encounters:  08/05/18 6\' 2"  (1.88 m)    Weight:   Wt Readings from Last 1 Encounters:  08/05/18 99.8 kg    Ideal Body Weight:  86.4 kg  BMI:  Body mass index is 28.25 kg/m.  Estimated Nutritional Needs:   Kcal:  2231  Protein:  105-120 grams  Fluid:  1.9-2.1 L    Gaynell Face, MS, RD, LDN Inpatient Clinical Dietitian Pager: [phone] Weekend/After Hours: [phone]

## 2018-08-07 NOTE — Progress Notes (Signed)
Patient's wife called and alerted as to change in patient's status and that he was now in ICU on respirator and the rationale for the change in his plan of care.  She stated she would be here this afternoon to see him and spoke of her appreciation of the high quality of his care.

## 2018-08-07 NOTE — Progress Notes (Signed)
CRITICAL VALUE ALERT  Critical Value:  Troponin 0.15  Date & Time Notied:  08/07/18  1430  Provider Notified: Dr. Elsworth Soho  Orders Received/Actions taken: No new orders  Rich Paprocki, Zenovia Jordan, RN

## 2018-08-07 NOTE — Progress Notes (Signed)
RSI kit, 100 fentanyl, 2 versed pulled from pyxis.  Fentanyl and versed was not used, unable to return because of COVID-19 rule out.  Passed meds onto night RN Alma.   

## 2018-08-07 NOTE — Progress Notes (Signed)
RT NOTE:  Vent changes per MD. MD wants plat pressures 30 or under with a VT of 7cc (570).

## 2018-08-07 NOTE — Progress Notes (Signed)
CRITICAL VALUE ALERT  Critical Value:  Troponin .70  Date & Time Notied:  2231 08/07/18  Provider Notified: Warren Lacy  Orders Received/Actions taken: see note/orders

## 2018-08-08 ENCOUNTER — Inpatient Hospital Stay (HOSPITAL_COMMUNITY): Payer: Medicare Other

## 2018-08-08 DIAGNOSIS — N179 Acute kidney failure, unspecified: Secondary | ICD-10-CM

## 2018-08-08 DIAGNOSIS — J189 Pneumonia, unspecified organism: Secondary | ICD-10-CM

## 2018-08-08 LAB — CBC WITH DIFFERENTIAL/PLATELET
Abs Immature Granulocytes: 0.17 10*3/uL — ABNORMAL HIGH (ref 0.00–0.07)
Basophils Absolute: 0 10*3/uL (ref 0.0–0.1)
Basophils Relative: 0 %
EOS ABS: 0 10*3/uL (ref 0.0–0.5)
EOS PCT: 0 %
HCT: 24 % — ABNORMAL LOW (ref 39.0–52.0)
Hemoglobin: 7.6 g/dL — ABNORMAL LOW (ref 13.0–17.0)
Immature Granulocytes: 1 %
Lymphocytes Relative: 9 %
Lymphs Abs: 1.2 10*3/uL (ref 0.7–4.0)
MCH: 34.1 pg — ABNORMAL HIGH (ref 26.0–34.0)
MCHC: 31.7 g/dL (ref 30.0–36.0)
MCV: 107.6 fL — ABNORMAL HIGH (ref 80.0–100.0)
MONO ABS: 0.9 10*3/uL (ref 0.1–1.0)
Monocytes Relative: 6 %
Neutro Abs: 11.6 10*3/uL — ABNORMAL HIGH (ref 1.7–7.7)
Neutrophils Relative %: 84 %
Platelets: 265 10*3/uL (ref 150–400)
RBC: 2.23 MIL/uL — ABNORMAL LOW (ref 4.22–5.81)
RDW: 11.7 % (ref 11.5–15.5)
WBC: 13.9 10*3/uL — ABNORMAL HIGH (ref 4.0–10.5)
nRBC: 0 % (ref 0.0–0.2)

## 2018-08-08 LAB — RESPIRATORY PANEL BY PCR

## 2018-08-08 LAB — COMPREHENSIVE METABOLIC PANEL
ALT: 19 U/L (ref 0–44)
AST: 31 U/L (ref 15–41)
Albumin: 2.1 g/dL — ABNORMAL LOW (ref 3.5–5.0)
Alkaline Phosphatase: 180 U/L — ABNORMAL HIGH (ref 38–126)
Anion gap: 10 (ref 5–15)
BUN: 27 mg/dL — ABNORMAL HIGH (ref 8–23)
CO2: 18 mmol/L — AB (ref 22–32)
Calcium: 7.2 mg/dL — ABNORMAL LOW (ref 8.9–10.3)
Chloride: 113 mmol/L — ABNORMAL HIGH (ref 98–111)
Creatinine, Ser: 2.09 mg/dL — ABNORMAL HIGH (ref 0.61–1.24)
GFR, EST AFRICAN AMERICAN: 36 mL/min — AB (ref >60)
GFR, EST NON AFRICAN AMERICAN: 31 mL/min — AB (ref >60)
Glucose, Bld: 123 mg/dL — ABNORMAL HIGH (ref 70–99)
Potassium: 3.3 mmol/L — ABNORMAL LOW (ref 3.5–5.1)
SODIUM: 141 mmol/L (ref 135–145)
Total Bilirubin: 0.8 mg/dL (ref 0.3–1.2)
Total Protein: 5.9 g/dL — ABNORMAL LOW (ref 6.5–8.1)

## 2018-08-08 LAB — GLUCOSE, CAPILLARY
GLUCOSE-CAPILLARY: 121 mg/dL — AB (ref 70–99)
Glucose-Capillary: 130 mg/dL — ABNORMAL HIGH (ref 70–99)
Glucose-Capillary: 117 mg/dL — ABNORMAL HIGH (ref 70–99)
Glucose-Capillary: 131 mg/dL — ABNORMAL HIGH (ref 70–99)
Glucose-Capillary: 131 mg/dL — ABNORMAL HIGH (ref 70–99)
Glucose-Capillary: 133 mg/dL — ABNORMAL HIGH (ref 70–99)

## 2018-08-08 LAB — VITAMIN B12: Vitamin B-12: 1499 pg/mL — ABNORMAL HIGH (ref 180–914)

## 2018-08-08 LAB — HEPARIN LEVEL (UNFRACTIONATED): Heparin Unfractionated: 0.41 IU/mL (ref 0.30–0.70)

## 2018-08-08 LAB — ECHOCARDIOGRAM LIMITED
HEIGHTINCHES: 74 in
WEIGHTICAEL: 3520 [oz_av]

## 2018-08-08 LAB — CULTURE, BLOOD (ROUTINE X 2): Special Requests: ADEQUATE

## 2018-08-08 LAB — PROCALCITONIN: Procalcitonin: 60.37 ng/mL

## 2018-08-08 LAB — TROPONIN I
Troponin I: 1.37 ng/mL
Troponin I: 1.15 ng/mL (ref <0.03)

## 2018-08-08 MED ORDER — DEXMEDETOMIDINE HCL IN NACL 400 MCG/100ML IV SOLN
0.2000 ug/kg/h | INTRAVENOUS | Status: DC
  Administered 2018-08-08: 0.4 ug/kg/h via INTRAVENOUS
  Administered 2018-08-08: 0.5 ug/kg/h via INTRAVENOUS
  Administered 2018-08-09 (×2): 1 ug/kg/h via INTRAVENOUS
  Administered 2018-08-09: 0.7 ug/kg/h via INTRAVENOUS
  Administered 2018-08-09: 0.4 ug/kg/h via INTRAVENOUS
  Administered 2018-08-10: 0.7 ug/kg/h via INTRAVENOUS
  Administered 2018-08-10: 0.6 ug/kg/h via INTRAVENOUS
  Administered 2018-08-10: 1 ug/kg/h via INTRAVENOUS
  Administered 2018-08-10: 0.5 ug/kg/h via INTRAVENOUS
  Administered 2018-08-10: 1 ug/kg/h via INTRAVENOUS
  Administered 2018-08-11: 0.4 ug/kg/h via INTRAVENOUS
  Filled 2018-08-08 (×3): qty 100
  Filled 2018-08-08: qty 200
  Filled 2018-08-08 (×7): qty 100

## 2018-08-08 MED ORDER — PIPERACILLIN-TAZOBACTAM 3.375 G IVPB
3.3750 g | Freq: Three times a day (TID) | INTRAVENOUS | Status: DC
  Administered 2018-08-08 – 2018-08-11 (×9): 3.375 g via INTRAVENOUS
  Filled 2018-08-08 (×9): qty 50

## 2018-08-08 MED ORDER — METOPROLOL TARTRATE 25 MG/10 ML ORAL SUSPENSION
25.0000 mg | Freq: Two times a day (BID) | ORAL | Status: DC
  Administered 2018-08-09 – 2018-08-10 (×2): 25 mg
  Filled 2018-08-08 (×7): qty 10

## 2018-08-08 MED ORDER — METOPROLOL TARTRATE 25 MG/10 ML ORAL SUSPENSION
50.0000 mg | Freq: Two times a day (BID) | ORAL | Status: DC
  Filled 2018-08-08: qty 20

## 2018-08-08 MED ORDER — POTASSIUM CHLORIDE 20 MEQ/15ML (10%) PO SOLN
40.0000 meq | Freq: Once | ORAL | Status: AC
  Administered 2018-08-08: 40 meq
  Filled 2018-08-08: qty 30

## 2018-08-08 MED ORDER — AZITHROMYCIN 200 MG/5ML PO SUSR
500.0000 mg | Freq: Every day | ORAL | Status: DC
  Administered 2018-08-08: 500 mg
  Filled 2018-08-08 (×3): qty 15

## 2018-08-08 MED ORDER — MIDAZOLAM 50MG/50ML (1MG/ML) PREMIX INFUSION
0.5000 mg/h | INTRAVENOUS | Status: DC
  Administered 2018-08-08: 0.5 mg/h via INTRAVENOUS
  Filled 2018-08-08: qty 50

## 2018-08-08 NOTE — Progress Notes (Signed)
[facility] Progress Note Patient Name: David [name] DOB: 1945/09/23 MRN: [id number]   Date of Service  08/08/2018  HPI/Events of Note  Agitation - Patient now on ceiling Fentanyl IV infusion dose of 400 mcg/hour  eICU Interventions  Will order: 1. Low dose Versed IV infusion. Titrate to RAAS = 0 to -1.      Intervention Category Major Interventions: Delirium, psychosis, severe agitation - evaluation and management  Drae Mitzel Eugene 08/08/2018, 12:29 AM

## 2018-08-08 NOTE — Progress Notes (Signed)
RT NOTE:  Ventilator changes made per verbal order of MD

## 2018-08-08 NOTE — Progress Notes (Signed)
Pharmacy Antibiotic Note  David [name] is a 73 y.o. male admitted on 08/05/2018 with pneumonia and sepsis.  Pharmacy has been consulted for zosyn dosing for possible nosocomial infection. Creatinine improving, CrCl ~41.  Plan: Zosyn 3.375gm IV Q8H (4 hr inf) F/u renal fxn, C&S, clinical status   Height: 6\' 2"  (188 cm) Weight: 220 lb (99.8 kg) IBW/kg (Calculated) : 82.2  Temp (24hrs), Avg:99.1 F (37.3 C), Min:97.6 F (36.4 C), Max:100.1 F (37.8 C)  Recent Labs  Lab 08/05/18 0409 08/06/18 0430 08/07/18 1105 08/08/18 0255  WBC 11.9* 15.4* 31.9* 13.9*  CREATININE 2.55* 2.78* 2.33* 2.09*  LATICACIDVEN 1.9  --   --   --     Estimated Creatinine Clearance: 40.9 mL/min (A) (by C-G formula based on SCr of 2.09 mg/dL (H)).    No Known Allergies  Antimicrobials this admission: 3/19 Vanc >> 3/19 3/19 Cefepime >> 3/19 CTX 3/19 >>3/21 Azith 3/19>>(3/26) Zosyn 3/22>>  Dose adjustments this admission: N/A  Microbiology results: Pending  Thank you for allowing pharmacy to be a part of this patient's care.   Arrie Senate, PharmD, BCPS Clinical Pharmacist Please check AMION for all [facility] Pharmacy numbers 08/08/2018

## 2018-08-08 NOTE — Progress Notes (Signed)
  Echocardiogram 2D Echocardiogram has been performed.  The study was completed as a limited as to reduce employee exposure.  David [name] 08/08/2018, 8:55 AM

## 2018-08-08 NOTE — Progress Notes (Signed)
ANTICOAGULATION CONSULT NOTE  Pharmacy Consult for heparin Indication: chest pain/ACS  No Known Allergies  Patient Measurements: Height: 6\' 2"  (188 cm) Weight: 220 lb (99.8 kg) IBW/kg (Calculated) : 82.2  Vital Signs: Temp: 98.8 F (37.1 C) (03/22 0400) Temp Source: Oral (03/22 0400) BP: 156/70 (03/22 0800) Pulse Rate: 64 (03/22 0800)  Labs: Recent Labs    08/06/18 0430  08/07/18 1105 08/07/18 1330 08/07/18 1405 08/07/18 2111 08/08/18 0255 08/08/18 0753  HGB 7.8*  --  10.2*  --  9.5*  --  7.6*  --   HCT 25.1*  --  33.1*  --  28.0*  --  24.0*  --   PLT 210  --  370  --   --   --  265  --   HEPARINUNFRC  --   --   --   --   --   --   --  0.41  CREATININE 2.78*  --  2.33*  --   --   --  2.09*  --   TROPONINI  --    < > 0.07* 0.15*  --  0.70* 1.37*  --    < > = values in this interval not displayed.    Estimated Creatinine Clearance: 40.9 mL/min (A) (by C-G formula based on SCr of 2.09 mg/dL (H)).  Assessment: 73yo male admitted 3/19 w/ PNA/sepsis and r/o Covid-19, now w/ positive and increasing troponins w/ abnormalities on EKG continues on IV heparin. Initial heparin level is therapeutic. No bleeding or other issues noted.   Goal of Therapy:  Heparin level 0.3-0.7 units/ml Monitor platelets by anticoagulation protocol: Yes   Plan:  Continue heparin gtt 1200 units/hr Daily heparin level and CBC  Salome Arnt, PharmD, BCPS Please see AMION for all pharmacy numbers 08/08/2018 9:18 AM

## 2018-08-08 NOTE — Progress Notes (Signed)
PULMONARY / CRITICAL CARE MEDICINE   NAME:  David [name], MRN:  [id number], DOB:  Jun 15, 1945, LOS: 3 ADMISSION DATE:  08/05/2018, CONSULTATION DATE:  08/07/2018 REFERRING MD:  Reggy Eye. CHIEF COMPLAINT:  Acute hypoxemic, hypercapneic respiratory failure  BRIEF HISTORY:    This is a 73y. o B M with a PMH CAD-es/p stent placement, diastolic CHF, pulm HTN, DM type 2, CKD III. He presented to the ED 3/19 with with fever up to 104.9, generalized weakness, chills and a dry cough within the 24 hours pta. He denied headache, chest pain, abdominal pain, cough, shortness of breath, vomiting, diarrhea, or rash.  However, the COVID screen did report a dry cough within the 12 hours pta. There were no known sick contacts or recent travel; did have contact with individuals traveling from [place].  Did have a flu vaccination this year. In the ED he was treated for presumptive SIRS and iniitated on Rocephin, Zithromax and vanc 2 g x 1. Procalcitonin was 1.28 on adm and has risen to 59.31 (3/21). WBC has risen from 15.4 on admission to 31.9 today. Initial PCXR (personally reviewed) was somewhat underinflated,with interstitial  infiltrates which worsened on subsequent films  Intubated for ARDS  SIGNIFICANT PAST MEDICAL HISTORY   CAD, es/p stent 11/18 CKD Type II DM  SIGNIFICANT EVENTS:  3/21 tr to ICU & intubated, failed bipap 3/22 heparin gtt for trop pos 3/22 bradycardia with propofol, Versed drip started  STUDIES:   Post intubation PCXR - bilateral infiltrates c/w ARDS Influenza A/B - neg CULTURES:  BC 3/18: S. Viridans 1/2 Flu neg RVP 3/22 >>  ANTIBIOTICS:  Rocephin 3/19>> Zithromax 3/19>> Vanc 3/19 x1  LINES/TUBES:  ETT 3/21 OGT 3/21  CONSULTANTS:  None SUBJECTIVE:   Overnight he was bradycardic with propofol and Versed drip started. Afebrile Remains critically ill, intubated, hemodynamically stable   CONSTITUTIONAL: BP (!) 156/70 (BP Location: Right Arm)   Pulse 64   Temp  98.8 F (37.1 C) (Oral)   Resp 20   Ht '6\' 2"'  (1.88 m)   Wt 99.8 kg   SpO2 100%   BMI 28.25 kg/m   I/O last 3 completed shifts: In: 4190.7 [I.V.:3410.6; NG/GT:180; IV Piggyback:600.1] Out: 1430 [Urine:1180; Emesis/NG output:250]     Vent Mode: PRVC FiO2 (%):  [60 %-100 %] 60 % Set Rate:  [22 bmp-28 bmp] 28 bmp Vt Set:  [570 mL-650 mL] 570 mL PEEP:  [10 cmH20] 10 cmH20 Plateau Pressure:  [27 cmH20-31 cmH20] 30 cmH20  PHYSICAL EXAM: Obese man, orally intubated, sedated on Versed and fentanyl drips Mild pallor, no icterus, no JVD Decreased breath sounds bilateral, no rhonchi S1-S2 bradycardia, sinus on monitor Soft, obese abdomen, nontender, RA SS 0, wakes up easily and follows commands No edema No rash  Chest x-ray 3/22 personally reviewed which shows improved aeration bilaterally with decreased airspace disease, which may be an effective PEEP   RESOLVED PROBLEM LIST   ASSESSMENT AND PLAN   1) ARDS. Has hypoxemic and hypercapneic respiratory failure.  -Lung protective ventilation Improving hypoxia, can drop PEEP to 5 and start spontaneous breathing trials  Community-acquired pneumonia -flu negative, await respiratory viral panel which was added on today  As patient does have chronic conditions that can impair immune function, COVID-19 must be considered pending test result.  Dominant neutrophils on differential Willa lso send trach aspirate for culture.  Continue ceftriaxone azithromycin for now Obtain urine strep antigen and Legionella for completion  2) Type II DM. Has elevated sugars. Place  on SSI  3) AKI on CKD. Baseline 1.5   4) Macrocytic anemia. Will check B12 and folate  5) Increasing alk phos/ bili.  Normalized, continue Lipitor  6) elevated troponin-rising to 1.4, will continue to trend to capture peak, given ASA and started on IV heparin, obtain echo for completion  7) acute encephalopathy-related to sedation Use low-dose Precedex if bradycardia  tolerates rather than Versed and continue fentanyl. Decrease Lopressor to 25 twice daily   SUMMARY OF TODAY'S PLAN:  Severe pneumonia with organism unclear but favor community-acquired, COVID being considered with current scenario-clinically improving and hopeful for extubation    Best Practice / Goals of Care / Disposition.   DVT PROPHYLAXIS:SCD/heparin gtt NUTRITION:NPO MOBILITY:BR GOALS OF CARE:Supportive FAMILY DISCUSSIONS: family not present DISPOSITION ICU   The patient is critically ill with multiple organ systems failure and requires high complexity decision making for assessment and support, frequent evaluation and titration of therapies, application of advanced monitoring technologies and extensive interpretation of multiple databases. Critical Care Time devoted to patient care services described in this note independent of APP/resident  time is 35 minutes.   Kara Mead MD. Shade Flood. [facility] Pager [phone] If no response call [phone]   08/08/2018

## 2018-08-08 NOTE — Progress Notes (Signed)
RT NOTE:  Changed pt's ETT hollister.

## 2018-08-08 NOTE — Progress Notes (Signed)
Attempted to place bite block due to patient chewing on tube and agitated.  Pt kept spitting bite block out and coughing/gagging. RN called for sedation.

## 2018-08-08 NOTE — Progress Notes (Signed)
Used 76m of Versed that was left in the room by day shift. (scanned)   Per pharmacy ok to wipe down what was left of RSI kit and send back to pharmacy.    100 mcg of Fentanyl that was left from day shift discarded in sharps bin. Witnessed by JVivia EwingRN.

## 2018-08-08 NOTE — Progress Notes (Signed)
[facility] Progress Note Patient Name: David [name] DOB: [birth year] MRN: [id number]   Date of Service  08/08/2018  HPI/Events of Note  K+ = 3.3 and Creatinine = 2.09. Ca++ = 7.2 which corrects to 8.72 given albumin = 2.1.  eICU Interventions  Will replace K+.      Intervention Category Major Interventions: Electrolyte abnormality - evaluation and management  [name],[name] 08/08/2018, 6:01 AM

## 2018-08-08 NOTE — Progress Notes (Signed)
CRITICAL VALUE ALERT  Critical Value:  Troponin 1.37  Date & Time Notied:  08/08/18 0414  Provider Notified: Warren Lacy  Orders Received/Actions taken: see orders/notes

## 2018-08-09 ENCOUNTER — Inpatient Hospital Stay (HOSPITAL_COMMUNITY): Payer: Medicare Other

## 2018-08-09 LAB — GLUCOSE, CAPILLARY
GLUCOSE-CAPILLARY: 133 mg/dL — AB (ref 70–99)
Glucose-Capillary: 123 mg/dL — ABNORMAL HIGH (ref 70–99)
Glucose-Capillary: 135 mg/dL — ABNORMAL HIGH (ref 70–99)
Glucose-Capillary: 143 mg/dL — ABNORMAL HIGH (ref 70–99)
Glucose-Capillary: 145 mg/dL — ABNORMAL HIGH (ref 70–99)
Glucose-Capillary: 152 mg/dL — ABNORMAL HIGH (ref 70–99)

## 2018-08-09 LAB — BASIC METABOLIC PANEL
Anion gap: 10 (ref 5–15)
BUN: 19 mg/dL (ref 8–23)
CO2: 19 mmol/L — ABNORMAL LOW (ref 22–32)
CREATININE: 1.73 mg/dL — AB (ref 0.61–1.24)
Calcium: 8.1 mg/dL — ABNORMAL LOW (ref 8.9–10.3)
Chloride: 112 mmol/L — ABNORMAL HIGH (ref 98–111)
GFR calc Af Amer: 45 mL/min — ABNORMAL LOW (ref >60)
GFR calc non Af Amer: 39 mL/min — ABNORMAL LOW (ref >60)
Glucose, Bld: 144 mg/dL — ABNORMAL HIGH (ref 70–99)
Potassium: 3.6 mmol/L (ref 3.5–5.1)
Sodium: 141 mmol/L (ref 135–145)

## 2018-08-09 LAB — CBC WITH DIFFERENTIAL/PLATELET
Abs Immature Granulocytes: 0.22 10*3/uL — ABNORMAL HIGH (ref 0.00–0.07)
Basophils Absolute: 0 10*3/uL (ref 0.0–0.1)
Basophils Relative: 0 %
Eosinophils Absolute: 0.1 10*3/uL (ref 0.0–0.5)
Eosinophils Relative: 1 %
HCT: 25.5 % — ABNORMAL LOW (ref 39.0–52.0)
Hemoglobin: 8.2 g/dL — ABNORMAL LOW (ref 13.0–17.0)
IMMATURE GRANULOCYTES: 2 %
Lymphocytes Relative: 10 %
Lymphs Abs: 1.2 10*3/uL (ref 0.7–4.0)
MCH: 34.5 pg — ABNORMAL HIGH (ref 26.0–34.0)
MCHC: 32.2 g/dL (ref 30.0–36.0)
MCV: 107.1 fL — ABNORMAL HIGH (ref 80.0–100.0)
Monocytes Absolute: 0.9 10*3/uL (ref 0.1–1.0)
Monocytes Relative: 7 %
NEUTROS ABS: 10.1 10*3/uL — AB (ref 1.7–7.7)
NEUTROS PCT: 80 %
Platelets: 257 10*3/uL (ref 150–400)
RBC: 2.38 MIL/uL — ABNORMAL LOW (ref 4.22–5.81)
RDW: 11.9 % (ref 11.5–15.5)
WBC: 12.6 10*3/uL — ABNORMAL HIGH (ref 4.0–10.5)
nRBC: 0 % (ref 0.0–0.2)

## 2018-08-09 LAB — PHOSPHORUS: Phosphorus: 3.1 mg/dL (ref 2.5–4.6)

## 2018-08-09 LAB — PROCALCITONIN: Procalcitonin: 25.59 ng/mL

## 2018-08-09 LAB — HEPARIN LEVEL (UNFRACTIONATED): Heparin Unfractionated: 0.51 IU/mL (ref 0.30–0.70)

## 2018-08-09 LAB — TROPONIN I: Troponin I: 0.38 ng/mL (ref <0.03)

## 2018-08-09 LAB — MAGNESIUM: Magnesium: 1.9 mg/dL (ref 1.7–2.4)

## 2018-08-09 LAB — FOLATE RBC
Folate, Hemolysate: 312 ng/mL
Folate, RBC: 1387 ng/mL (ref >498)
Hematocrit: 22.5 % — ABNORMAL LOW (ref 37.5–51.0)

## 2018-08-09 MED ORDER — AMLODIPINE BESYLATE 5 MG PO TABS
5.0000 mg | ORAL_TABLET | Freq: Every day | ORAL | Status: DC
  Administered 2018-08-09 – 2018-08-11 (×4): 5 mg
  Filled 2018-08-09 (×4): qty 1

## 2018-08-09 MED ORDER — AZITHROMYCIN 200 MG/5ML PO SUSR
500.0000 mg | Freq: Every day | ORAL | Status: AC
  Administered 2018-08-09 – 2018-08-11 (×3): 500 mg
  Filled 2018-08-09 (×3): qty 12.5

## 2018-08-09 MED ORDER — PANTOPRAZOLE SODIUM 40 MG PO PACK
40.0000 mg | PACK | Freq: Every day | ORAL | Status: DC
  Administered 2018-08-10 – 2018-08-11 (×2): 40 mg
  Filled 2018-08-09 (×2): qty 20

## 2018-08-09 MED ORDER — HEPARIN SODIUM (PORCINE) 5000 UNIT/ML IJ SOLN
5000.0000 [IU] | Freq: Three times a day (TID) | INTRAMUSCULAR | Status: DC
  Administered 2018-08-09 – 2018-08-15 (×16): 5000 [IU] via SUBCUTANEOUS
  Filled 2018-08-09 (×17): qty 1

## 2018-08-09 NOTE — Progress Notes (Signed)
PULMONARY / CRITICAL CARE MEDICINE   NAME:  David [name], MRN:  [id number], DOB:  05/01/1946, LOS: 4 ADMISSION DATE:  08/05/2018, CONSULTATION DATE:  08/07/2018 REFERRING MD:  Reggy Eye. CHIEF COMPLAINT:  Acute hypoxemic, hypercapneic respiratory failure  BRIEF HISTORY:    This is a 73y. o B M with a PMH CAD-es/p stent placement, diastolic CHF, pulm HTN, DM type 2, CKD III. He presented to the ED 3/19 with with fever up to 104.9, generalized weakness, chills and a dry cough within the 24 hours pta. He denied headache, chest pain, abdominal pain, cough, shortness of breath, vomiting, diarrhea, or rash.  However, the COVID screen did report a dry cough within the 12 hours pta. There were no known sick contacts or recent travel; did have contact with individuals traveling from [place].  Did have a flu vaccination this year. In the ED he was treated for presumptive SIRS and iniitated on Rocephin, Zithromax and vanc 2 g x 1. Procalcitonin was 1.28 on adm and has risen to 59.31 (3/21). WBC has risen from 15.4 on admission to 31.9 today. Initial PCXR (personally reviewed) was somewhat underinflated,with interstitial  infiltrates which worsened on subsequent films  Intubated for ARDS  SIGNIFICANT PAST MEDICAL HISTORY   CAD, es/p stent 11/18 CKD Type II DM  SIGNIFICANT EVENTS:  3/21 tr to ICU & intubated, failed bipap 3/22 heparin gtt for trop pos 3/22 bradycardia with propofol, Versed drip started  STUDIES:   Post intubation PCXR - bilateral infiltrates c/w ARDS Influenza A/B - neg TTE 3/22 >> low normal systolic function 47-42%, concentric LVH.  Unable to truly assess diastolic function, right heart function  CULTURES:  BC 3/18: S. Viridans 1 of 2 Urine pneumococcal antigen 3/19 >> negative Flu 3/22 >> neg RVP 3/22 >> negative Novel CV 3/19 >>  Tracheal asp 3/21 >>   ANTIBIOTICS:  Vanc 3/19 x1 Cefepime 3/19 x 1 Rocephin 3/19>> 3/22 Zithromax 3/19>> Zosyn 3/22 >>   LINES/TUBES:   ETT 3/21 >>  OGT 3/21 >>   CONSULTANTS:  None  SUBJECTIVE:   Tolerating PSV 5 this am Ceftriaxone changed to Zosyn yesterday   CONSTITUTIONAL: BP (!) 173/78   Pulse 80   Temp 97.7 F (36.5 C) (Oral)   Resp 17   Ht 6\' 2"  (1.88 m)   Wt 99.8 kg   SpO2 99%   BMI 28.25 kg/m   I/O last 3 completed shifts: In: 1872.3 [I.V.:1652; NG/GT:120; IV Piggyback:100.3] Out: 2630 [Urine:2530; Emesis/NG output:100]     Vent Mode: PSV;CPAP FiO2 (%):  [40 %] 40 % Set Rate:  [20 bmp] 20 bmp Vt Set:  [570 mL] 570 mL PEEP:  [5 cmH20] 5 cmH20 Pressure Support:  [5 cmH20-10 cmH20] 5 cmH20 Plateau Pressure:  [16 cmH20-25 cmH20] 25 cmH20  PHYSICAL EXAM: Gen: Ill-appearing man, ventilated, sedated Neuro: Wide-awake, nods to questions, somewhat anxious, attempting to communicate.  Moves all extremities with good strength HEENT: ET tube in place, G-tube in place, oropharynx moist, no significant secretions Lungs: Somewhat coarse bilaterally, no crackles, no wheezes CV: Regular, no murmur Abdomen: Soft, nondistended with positive bowel sounds Extremities: No significant lower extremity edema Skin: No rash  CXR 3/23 reviewed by me, continues to show improvement in B interstitial infiltrates. Has not fully cleared   RESOLVED PROBLEM LIST  Elevated bilirubin, alkaline phosphatase  ASSESSMENT AND PLAN   ARDS.  Suspect due to CAP, considering also COVID-19 source, consider ALI due to S. Viridans bacteremia.  Plan: Continue lung protective  ventilator strategy, 6 cc/kg.  PEEP and FiO2 have been weaned appropriately, currently 5, 0.40 Spontaneous breathing trials as he can tolerate, moving towards extubation Antibiotics as below pending culture data, COVID testing Volume removal as he can tolerate.   Community-acquired pneumonia -flu negative, RVP negative, novel CV pending. Ur strep Ag negative Procalcitonin improving last 24 hours Plan: As above patient considered high risk for possible  COVID-19 based on presentation, possible exposure. Following culture date Ceftriaxone broadened to Zosyn on 3/22, continue azithromycin  Viridans strep bacteremia. ? Contaminant vs true pathogen. TTE 3/22 no evidence endocarditis.  Plan: Covered with zosyn (was on ceftriaxone since 3/19)  May need to consider TEE depending on index suspicion, would certainly defer until we are able to establish COVID-19 negative  Type II DM. Has elevated sugars.  Plan: Continue SSI per protocol Got some coughing going on and there AKI on CKD. Baseline 1.5; Plan: Serum creatinine continues to improve.  Would like to initiate diuresis soon, consider 3/24  Macrocytic anemia.  Vitamin B12 reassuring Plan: Folate pending  Apparent distress non-ST elevation MI, troponin peak at 1.37 (3/22) Echocardiogram reassuring Plan: Noted echocardiogram results; unable to assess diastolic fxn On heparin infusion 3/21 pm, should be able to discontinue 3/23 and convert to subcutaneous heparin prophylaxis Atorvastatin Aspirin 81  HTN with hx chronic diastolic CHF Plan:  Goal restart diuresis soon as renal function improves, note on home torsemide Isosorbide/hydralazine as ordered Metoprolol 25 twice daily Amlodipine 5  Acute encephalopathy-related to sedation Plan: Currently on Precedex, fentanyl gtts    SUMMARY OF TODAY'S PLAN:  Severe pneumonia with organism unclear but favor community-acquired, COVID being considered with current scenario-clinically improving and hopeful for extubation soon. Consider also component diastolic CHF given resolving infiltrates on CXR    Best Practice / Goals of Care / Disposition.   DVT PROPHYLAXIS:SCD/heparin sq NUTRITION:NPO MOBILITY:BR GOALS OF CARE:Supportive FAMILY DISCUSSIONS: no family present 3/23 DISPOSITION ICU   The patient is critically ill with multiple organ systems failure and requires high complexity decision making for assessment and support, frequent  evaluation and titration of therapies, application of advanced monitoring technologies and extensive interpretation of multiple databases.   Independent CC time 35 minutes  Baltazar Apo, MD, PhD 08/09/2018, 10:29 AM [facility] [phone] or if no answer [phone]

## 2018-08-09 NOTE — Progress Notes (Signed)
[facility] Progress Note Patient Name: David [name] DOB: 03/20/1946 MRN: [id number]   Date of Service  08/09/2018  HPI/Events of Note  Hypertension - BP = 175/70. Already on Isosorbid-Hydralazine. Metoprolol held d/t bradycardia.   eICU Interventions  Will order: 1. Amlodipine 5 mg per tube now and Q day.      Intervention Category Major Interventions: Hypertension - evaluation and management  Kasin Tonkinson Cornelia Copa 08/09/2018, 12:37 AM

## 2018-08-09 NOTE — Progress Notes (Signed)
[facility] for heparin Indication: chest pain/ACS  No Known Allergies  Patient Measurements: Height: 6\' 2"  (188 cm) Weight: 220 lb (99.8 kg) IBW/kg (Calculated) : 82.2  Vital Signs: Temp: 97.7 F (36.5 C) (03/23 0745) Temp Source: Oral (03/23 0745) BP: 173/78 (03/23 0900) Pulse Rate: 80 (03/23 0900)  Labs: Recent Labs    08/07/18 1105  08/07/18 1405  08/08/18 0255 08/08/18 0753 08/08/18 0951 08/09/18 0447  HGB 10.2*  --  9.5*  --  7.6*  --   --  8.2*  HCT 33.1*  --  28.0*  --  24.0*  --   --  25.5*  PLT 370  --   --   --  265  --   --  257  HEPARINUNFRC  --   --   --   --   --  0.41  --  0.51  CREATININE 2.33*  --   --   --  2.09*  --   --  1.73*  TROPONINI 0.07*   < >  --    < > 1.37*  --  1.15* 0.38*   < > = values in this interval not displayed.    Estimated Creatinine Clearance: 49.4 mL/min (A) (by C-G formula based on SCr of 1.73 mg/dL (H)).  Assessment: 73yo male admitted 3/19 w/ PNA/sepsis and r/o Covid-19, now w/ positive and increasing troponins w/ abnormalities on EKG continues on IV heparin.  Heparin level this morning remains therapeutic at 0.51. Troponin down to 0.38 today (peak 1.37). CBC remains stable. No bleeding or infusion issues noted.     Goal of Therapy:  Heparin level 0.3-0.7 units/ml Monitor platelets by anticoagulation protocol: Yes   Plan:  Continue heparin gtt 1200 units/hr Monitor daily heparin level and CBC F/u LOT  Jackson Latino, PharmD PGY1 Pharmacy Resident Phone [phone] 08/09/2018     10:09 AM

## 2018-08-09 NOTE — Consult Note (Signed)
[facility] Nurse wound consult note Patient receiving care in [facility]. Patient on COVID-19 isolation.  The consult was completed by camera and input from the patient's primary RN Reason for Consult: Diabetic Foot Ulcer The right foot has undergone prior amputation of 1st and 2nd toes and this surgical site is healed.  The plantar first metatarsal head has a dried callused area that does not have an open wound, no odor, no drainage, no induration.  The remaining toes do not have open wounds, however, toes 3, 4 and 5 are hammer toes.  There are foam dressings over the areas to serve as protection.  Please continue these foam dressings. Monitor the wound area(s) for worsening of condition such as: Signs/symptoms of infection,  Increase in size,  Development of or worsening of odor, Development of pain, or increased pain at the affected locations.  Notify the medical team if any of these develop.  Thank you for the consult.  Discussed plan of care with the patient and bedside nurse.  [facility] nurse will not follow at this time.  Please re-consult the [facility] team if needed.  Val Riles, RN, MSN, CWOCN, CNS-BC, pager [phone]

## 2018-08-10 ENCOUNTER — Inpatient Hospital Stay (HOSPITAL_COMMUNITY): Payer: Medicare Other

## 2018-08-10 DIAGNOSIS — I5032 Chronic diastolic (congestive) heart failure: Secondary | ICD-10-CM

## 2018-08-10 DIAGNOSIS — J9601 Acute respiratory failure with hypoxia: Secondary | ICD-10-CM

## 2018-08-10 LAB — CBC
HCT: 25.7 % — ABNORMAL LOW (ref 39.0–52.0)
Hemoglobin: 8.1 g/dL — ABNORMAL LOW (ref 13.0–17.0)
MCH: 33.8 pg (ref 26.0–34.0)
MCHC: 31.5 g/dL (ref 30.0–36.0)
MCV: 107.1 fL — ABNORMAL HIGH (ref 80.0–100.0)
Platelets: 276 10*3/uL (ref 150–400)
RBC: 2.4 MIL/uL — ABNORMAL LOW (ref 4.22–5.81)
RDW: 11.8 % (ref 11.5–15.5)
WBC: 14.1 10*3/uL — ABNORMAL HIGH (ref 4.0–10.5)
nRBC: 0 % (ref 0.0–0.2)

## 2018-08-10 LAB — BASIC METABOLIC PANEL
Anion gap: 16 — ABNORMAL HIGH (ref 5–15)
BUN: 11 mg/dL (ref 8–23)
CO2: 18 mmol/L — AB (ref 22–32)
Calcium: 8.4 mg/dL — ABNORMAL LOW (ref 8.9–10.3)
Chloride: 107 mmol/L (ref 98–111)
Creatinine, Ser: 1.56 mg/dL — ABNORMAL HIGH (ref 0.61–1.24)
GFR calc Af Amer: 51 mL/min — ABNORMAL LOW (ref >60)
GFR calc non Af Amer: 44 mL/min — ABNORMAL LOW (ref >60)
GLUCOSE: 140 mg/dL — AB (ref 70–99)
Potassium: 3.1 mmol/L — ABNORMAL LOW (ref 3.5–5.1)
Sodium: 141 mmol/L (ref 135–145)

## 2018-08-10 LAB — CULTURE, BLOOD (ROUTINE X 2)
Culture: NO GROWTH
Special Requests: ADEQUATE

## 2018-08-10 LAB — GLUCOSE, CAPILLARY
GLUCOSE-CAPILLARY: 125 mg/dL — AB (ref 70–99)
Glucose-Capillary: 134 mg/dL — ABNORMAL HIGH (ref 70–99)
Glucose-Capillary: 140 mg/dL — ABNORMAL HIGH (ref 70–99)
Glucose-Capillary: 159 mg/dL — ABNORMAL HIGH (ref 70–99)
Glucose-Capillary: 160 mg/dL — ABNORMAL HIGH (ref 70–99)
Glucose-Capillary: 165 mg/dL — ABNORMAL HIGH (ref 70–99)

## 2018-08-10 LAB — CULTURE, RESPIRATORY W GRAM STAIN

## 2018-08-10 LAB — MAGNESIUM: Magnesium: 1.8 mg/dL (ref 1.7–2.4)

## 2018-08-10 LAB — CULTURE, RESPIRATORY

## 2018-08-10 MED ORDER — FUROSEMIDE 10 MG/ML IJ SOLN
40.0000 mg | Freq: Three times a day (TID) | INTRAMUSCULAR | Status: AC
  Administered 2018-08-10 – 2018-08-11 (×3): 40 mg via INTRAVENOUS
  Filled 2018-08-10 (×3): qty 4

## 2018-08-10 MED ORDER — POTASSIUM CHLORIDE 20 MEQ PO PACK
40.0000 meq | PACK | ORAL | Status: DC
  Administered 2018-08-10 (×2): 40 meq via ORAL
  Filled 2018-08-10 (×3): qty 2

## 2018-08-10 MED ORDER — POTASSIUM CHLORIDE 20 MEQ/15ML (10%) PO SOLN
40.0000 meq | Freq: Once | ORAL | Status: AC
  Administered 2018-08-10: 40 meq
  Filled 2018-08-10: qty 30

## 2018-08-10 MED ORDER — ALPRAZOLAM 0.5 MG PO TABS
0.5000 mg | ORAL_TABLET | Freq: Two times a day (BID) | ORAL | Status: DC | PRN
  Administered 2018-08-10: 0.5 mg

## 2018-08-10 MED ORDER — ATORVASTATIN CALCIUM 80 MG PO TABS
80.0000 mg | ORAL_TABLET | Freq: Every day | ORAL | Status: DC
  Administered 2018-08-10: 80 mg
  Filled 2018-08-10: qty 1

## 2018-08-10 MED ORDER — JEVITY 1.2 CAL PO LIQD
1000.0000 mL | ORAL | Status: DC
  Filled 2018-08-10: qty 1000

## 2018-08-10 MED ORDER — VITAL AF 1.2 CAL PO LIQD: 1000.0000 mL | ORAL | Status: DC

## 2018-08-10 MED ORDER — DICYCLOMINE HCL 10 MG/5ML PO SOLN
10.0000 mg | Freq: Three times a day (TID) | ORAL | Status: DC
  Administered 2018-08-10 – 2018-08-12 (×5): 10 mg
  Filled 2018-08-10 (×7): qty 5

## 2018-08-10 NOTE — Progress Notes (Signed)
PULMONARY / CRITICAL CARE MEDICINE   NAME:  David [name], MRN:  [id number], DOB:  [birth year], LOS: 5 ADMISSION DATE:  08/05/2018, CONSULTATION DATE:  08/07/2018 REFERRING MD:  Reggy Eye. CHIEF COMPLAINT:  Acute hypoxemic, hypercapneic respiratory failure  BRIEF HISTORY:    This is a 73y. o B M with a PMH CAD-es/p stent placement, diastolic CHF, pulm HTN, DM type 2, CKD III. He presented to the ED 3/19 with with fever up to 104.9, generalized weakness, chills and a dry cough within the 24 hours pta. He denied headache, chest pain, abdominal pain, cough, shortness of breath, vomiting, diarrhea, or rash.  However, the COVID screen did report a dry cough within the 12 hours pta. There were no known sick contacts or recent travel; did have contact with individuals traveling from [place].  Did have a flu vaccination this year. In the ED he was treated for presumptive SIRS and iniitated on Rocephin, Zithromax and vanc 2 g x 1. Procalcitonin was 1.28 on adm and has risen to 59.31 (3/21). WBC has risen from 15.4 on admission to 31.9 today. Initial PCXR (personally reviewed) was somewhat underinflated,with interstitial  infiltrates which worsened on subsequent films  Intubated for ARDS  SIGNIFICANT PAST MEDICAL HISTORY   CAD, es/p stent 11/18 CKD Type II DM  SIGNIFICANT EVENTS:  3/21 tr to ICU & intubated, failed bipap 3/22 heparin gtt for trop pos 3/22 bradycardia with propofol, Versed drip started  STUDIES:   Post intubation PCXR - bilateral infiltrates c/w ARDS Influenza A/B - neg TTE 3/22 >> low normal systolic function 32-95%, concentric LVH.  Unable to truly assess diastolic function, right heart function  CULTURES:  BC 3/18: S. Viridans 1 of 2 Urine pneumococcal antigen 3/19 >> negative Flu 3/22 >> neg RVP 3/22 >> negative Novel CV 3/19 >>  Tracheal asp 3/21 >>   ANTIBIOTICS:  Vanc 3/19 x1 Cefepime 3/19 x 1 Rocephin 3/19>> 3/22 Zithromax 3/19>> Zosyn 3/22 >>   LINES/TUBES:   ETT 3/21 >>  OGT 3/21 >>   CONSULTANTS:  None  SUBJECTIVE:  Bradycardia  Tolerating some PSV but has been lethargic, back to [facility] ETT tube partially withdrawn on 3/24, had to be readvanced   CONSTITUTIONAL: BP (!) 188/77   Pulse 60   Temp 97.7 F (36.5 C) (Oral)   Resp 13   Ht 6\' 2"  (1.88 m)   Wt 99.8 kg   SpO2 100%   BMI 28.25 kg/m   I/O last 3 completed shifts: In: 2095.5 [I.V.:1455.2; NG/GT:410; IV Piggyback:230.3] Out: 3405 [Urine:3155; Emesis/NG output:250]     Vent Mode: PRVC FiO2 (%):  [40 %] 40 % Set Rate:  [20 bmp] 20 bmp Vt Set:  [570 mL] 570 mL PEEP:  [5 cmH20] 5 cmH20 Pressure Support:  [5 cmH20] 5 cmH20 Plateau Pressure:  [22 cmH20-29 cmH20] 22 cmH20  PHYSICAL EXAM: Gen: Ill-appearing man, ventilated, currently sedated Neuro: Did wake up to voice, nodded to questions and follow commands.  Moves all extremities.  Less anxious at this time although he is intermittently been anxious through the morning HEENT: ET tube in good position, G-tube in place Lungs: Somewhat coarse bilaterally, no crackles, no wheezes CV: Regular, bradycardic to 40s Abdomen: Soft, nondistended with positive bowel sounds Extremities: No significant lower extremity edema Skin: No rash  Chest x-ray 3/24 reviewed by me.  Stable bilateral interstitial infiltrates   RESOLVED PROBLEM LIST  Elevated bilirubin, alkaline phosphatase  ASSESSMENT AND PLAN   ARDS.  Suspect due to CAP,  considering also COVID-19 source, consider ALI due to S. Viridans bacteremia.  Plan: Continue ARDS protocol, sick cc per kilogram.  PEEP and FiO2 at goal Continue spontaneous breathing trials.  Mental status and need for sedation, agitation have been barriers. He would likely benefit from some diuresis, note that chest x-ray has improved Antibiotics as below pending culture data, novel coronavirus testing  Community-acquired pneumonia -flu negative, RVP negative, novel CV pending. Ur strep Ag  negative Procalcitonin improving last 24 hours Plan: As above patient considered high risk for possible COVID-19 based on presentation, possible exposure.  Continue to follow for novel coronavirus, culture data Azithromycin, Zosyn as ordered  Viridans strep bacteremia. ? Contaminant vs true pathogen. TTE 3/22 no evidence endocarditis.  Plan: Continue zosyn (was on ceftriaxone since 3/19)  May need to consider TEE depending on index suspicion, would certainly defer until we are able to establish COVID-19 negative.  Repeat blood cultures.  Persistent positive would influence our decision making regarding TEE  AKI on CKD. Peak creatinine 2.78 on 3/20; Baseline 1.5; Initiate moderate dose diuresis on 3/24 Follow urine output, chest x-ray, renal function, electrolytes  Type II DM. Has elevated sugars.  Plan: Continue SSI per protocol  Macrocytic anemia.  Vitamin B12 reassuring Plan: Folate pending  Apparent distress non-ST elevation MI, troponin peak at 1.37 (3/22) Echocardiogram reassuring Plan: Noted echocardiogram results; unable to assess diastolic fxn Heparin gtt stopped 3/23 Atorvastatin Aspirin 81  HTN with hx chronic diastolic CHF Plan:  Restart diuresis on 3/23, note on home torsemide Isosorbide/hydralazine as ordered Metoprolol 25 twice daily Amlodipine 5  Acute encephalopathy-related to sedation Plan: Currently on Precedex, fentanyl gtts  Xanax    SUMMARY OF TODAY'S PLAN:  Severe pneumonia with organism unclear but favor community-acquired, COVID being considered with current scenario-clinically improving and hopeful for extubation soon. Consider also component diastolic CHF given resolving infiltrates on CXR    Best Practice / Goals of Care / Disposition.   DVT PROPHYLAXIS:SCD/heparin sq NUTRITION:NPO MOBILITY:BR GOALS OF CARE:Supportive FAMILY DISCUSSIONS: Wife updated by phone by RN [facility] ICU   The patient is critically ill with multiple organ  systems failure and requires high complexity decision making for assessment and support, frequent evaluation and titration of therapies, application of advanced monitoring technologies and extensive interpretation of multiple databases.   Independent CC time 34 minutes  Baltazar Apo, MD, PhD 08/10/2018, 11:28 AM [facility] [phone] or if no answer [phone]

## 2018-08-10 NOTE — Progress Notes (Addendum)
Nutrition Follow-up RD working remotely.  DOCUMENTATION CODES:   Not applicable  INTERVENTION:   Change TF:  Vital AF 1.2 at 20 ml/h, when able to advance, goal rate is 75 ml/h (1800 ml per day)  Provides 2160 kcal, 135 gm protein, 1460 ml free water daily  NUTRITION DIAGNOSIS:   Inadequate oral intake related to inability to eat as evidenced by NPO status.  Ongoing  GOAL:   Patient will meet greater than or equal to 90% of their needs  Unmet  MONITOR:   Vent status, Labs, Skin, I & O's  ASSESSMENT:   73 yo male with PMH of CAD, diastolic CHF, pulmonary HTN, DM-2, CKD III who presented with fever up to 104.9, generalized weakness and dry cough within last 24 hours. Pt admitted with PNA, AKI, and fever.  Novel coronavirus test results are pending. OGT in place. TF has not been initiated. Medical team has put in trickle TF orders: Jevity 1.2 at 20 ml/h.  Patient is currently intubated on ventilator support MV: 11.2 L/min Temp (24hrs), Avg:98 F (36.7 C), Min:97 F (36.1 C), Max:99 F (37.2 C)   Labs reviewed. Potassium 3.1 (L), creatinine 1.56 (H) CBG's: 160-134-125-140 Medications reviewed and include novolog, lasix, Klor-Con, precedex.   NUTRITION - FOCUSED PHYSICAL EXAM:  unable to complete  Diet Order:   Diet Order            Diet NPO time specified  Diet effective now              EDUCATION NEEDS:   Not appropriate for education at this time  Skin:  Skin Assessment: Skin Integrity Issues: Skin Integrity Issues:: Diabetic Ulcer Diabetic Ulcer: right foot  Last BM:  3/21 large type 6  Height:   Ht Readings from Last 1 Encounters:  08/05/18 6\' 2"  (1.88 m)    Weight:   Wt Readings from Last 1 Encounters:  08/05/18 99.8 kg   No new weight since admission  Ideal Body Weight:  86.4 kg  BMI:  Body mass index is 28.25 kg/m.  Estimated Nutritional Needs:   Kcal:  2100  Protein:  120-140 gm  Fluid:  2.1 L    Molli Barrows, RD, LDN, [facility] Pager [phone] After Hours Pager [phone]

## 2018-08-11 ENCOUNTER — Inpatient Hospital Stay (HOSPITAL_COMMUNITY): Payer: Medicare Other

## 2018-08-11 DIAGNOSIS — J9621 Acute and chronic respiratory failure with hypoxia: Secondary | ICD-10-CM

## 2018-08-11 LAB — GLUCOSE, CAPILLARY
Glucose-Capillary: 191 mg/dL — ABNORMAL HIGH (ref 70–99)
Glucose-Capillary: 213 mg/dL — ABNORMAL HIGH (ref 70–99)
Glucose-Capillary: 226 mg/dL — ABNORMAL HIGH (ref 70–99)
Glucose-Capillary: 184 mg/dL — ABNORMAL HIGH (ref 70–99)

## 2018-08-11 LAB — BASIC METABOLIC PANEL
Anion gap: 12 (ref 5–15)
BUN: 12 mg/dL (ref 8–23)
CO2: 21 mmol/L — ABNORMAL LOW (ref 22–32)
CREATININE: 1.74 mg/dL — AB (ref 0.61–1.24)
Calcium: 8.3 mg/dL — ABNORMAL LOW (ref 8.9–10.3)
Chloride: 108 mmol/L (ref 98–111)
GFR calc Af Amer: 45 mL/min — ABNORMAL LOW (ref >60)
GFR calc non Af Amer: 39 mL/min — ABNORMAL LOW (ref >60)
Glucose, Bld: 212 mg/dL — ABNORMAL HIGH (ref 70–99)
Potassium: 3.7 mmol/L (ref 3.5–5.1)
Sodium: 141 mmol/L (ref 135–145)

## 2018-08-11 LAB — CBC
HCT: 24.4 % — ABNORMAL LOW (ref 39.0–52.0)
Hemoglobin: 7.9 g/dL — ABNORMAL LOW (ref 13.0–17.0)
MCH: 34.8 pg — AB (ref 26.0–34.0)
MCHC: 32.4 g/dL (ref 30.0–36.0)
MCV: 107.5 fL — AB (ref 80.0–100.0)
Platelets: 307 10*3/uL (ref 150–400)
RBC: 2.27 MIL/uL — ABNORMAL LOW (ref 4.22–5.81)
RDW: 12 % (ref 11.5–15.5)
WBC: 14.7 10*3/uL — ABNORMAL HIGH (ref 4.0–10.5)
nRBC: 0 % (ref 0.0–0.2)

## 2018-08-11 LAB — NOVEL CORONAVIRUS, NAA (HOSP ORDER, SEND-OUT TO REF LAB; TAT 18-24 HRS): SARS-CoV-2, NAA: NOT DETECTED

## 2018-08-11 LAB — MAGNESIUM: Magnesium: 1.7 mg/dL (ref 1.7–2.4)

## 2018-08-11 MED ORDER — AMLODIPINE BESYLATE 2.5 MG PO TABS
5.0000 mg | ORAL_TABLET | Freq: Every day | ORAL | Status: DC
  Administered 2018-08-12 – 2018-08-15 (×4): 5 mg via ORAL
  Filled 2018-08-11: qty 1
  Filled 2018-08-11 (×3): qty 2

## 2018-08-11 MED ORDER — ATORVASTATIN CALCIUM 80 MG PO TABS
80.0000 mg | ORAL_TABLET | Freq: Every day | ORAL | Status: DC
  Administered 2018-08-11 – 2018-08-14 (×4): 80 mg via ORAL
  Filled 2018-08-11 (×5): qty 1

## 2018-08-11 MED ORDER — ALBUTEROL SULFATE (2.5 MG/3ML) 0.083% IN NEBU: 2.5000 mg | INHALATION_SOLUTION | Freq: Four times a day (QID) | RESPIRATORY_TRACT | Status: DC | PRN

## 2018-08-11 MED ORDER — ORAL CARE MOUTH RINSE
15.0000 mL | Freq: Two times a day (BID) | OROMUCOSAL | Status: DC
  Administered 2018-08-11 – 2018-08-14 (×3): 15 mL via OROMUCOSAL

## 2018-08-11 MED ORDER — METOPROLOL TARTRATE 25 MG/10 ML ORAL SUSPENSION
25.0000 mg | Freq: Two times a day (BID) | ORAL | Status: DC
  Administered 2018-08-11: 25 mg via ORAL
  Filled 2018-08-11 (×2): qty 10

## 2018-08-11 MED ORDER — ALPRAZOLAM 0.5 MG PO TABS
0.5000 mg | ORAL_TABLET | Freq: Two times a day (BID) | ORAL | Status: DC | PRN
  Administered 2018-08-12 – 2018-08-15 (×3): 0.5 mg via ORAL
  Filled 2018-08-11 (×4): qty 1

## 2018-08-11 MED ORDER — FENTANYL CITRATE (PF) 100 MCG/2ML IJ SOLN: 25.0000 ug | INTRAMUSCULAR | Status: DC | PRN

## 2018-08-11 MED ORDER — PANTOPRAZOLE SODIUM 40 MG PO PACK
40.0000 mg | PACK | Freq: Every day | ORAL | Status: DC
  Filled 2018-08-11: qty 20

## 2018-08-11 MED ORDER — TORSEMIDE 20 MG PO TABS
10.0000 mg | ORAL_TABLET | Freq: Every day | ORAL | Status: DC
  Administered 2018-08-11 – 2018-08-15 (×5): 10 mg via ORAL
  Filled 2018-08-11 (×5): qty 1

## 2018-08-11 NOTE — Progress Notes (Signed)
Patient complaining of severe pain in Right wrist/hand area. Urging this RN to call MD to get an xray. Elink paged

## 2018-08-11 NOTE — Progress Notes (Signed)
PT Cancellation Note  Patient Details Name: David [name] MRN: [id number] DOB: 12-07-1945   Cancelled Treatment:    Reason Eval/Treat Not Completed: Medical issues which prohibited therapy Per health system leadership, therapy services are being held at this time until patient tests negative for COVID-19.    Leighton Ruff, PT, DPT  Acute Rehabilitation Services  Pager: [phone] Office: [phone]  Rudean Hitt 08/11/2018, 10:56 AM

## 2018-08-11 NOTE — Evaluation (Signed)
Clinical/Bedside Swallow Evaluation Patient Details  Name: David [name] MRN: [id number] Date of Birth: 11-Aug-1945  Today's Date: 08/11/2018 Time: SLP Start Time (ACUTE ONLY): 1200 SLP Stop Time (ACUTE ONLY): 1211 SLP Time Calculation (min) (ACUTE ONLY): 11 min  Past Medical History:  Past Medical History:  Diagnosis Date  . Arthritis   . CAD in native artery    s/p stent in 11/18  . CHF (congestive heart failure) ([facility])    normal echo in 11/18  . CKD (chronic kidney disease)   . DDD (degenerative disc disease), cervical   . Diabetes mellitus with complication (HCC)    Type II  . Diabetic neuropathy ([place])   . Diabetic retinopathy ([facility])   . GERD (gastroesophageal reflux disease)   . Glaucoma   . Hypertension    Past Surgical History:  Past Surgical History:  Procedure Laterality Date  . AMPUTATION Right 07/09/2016   Procedure: Right Great Toe Amputation at Metatarsophalangeal Joint;  Surgeon: [name], MD;  Location: [facility];  Service: Orthopedics;  Laterality: Right;  . AMPUTATION Right 05/28/2018   Procedure: RIGHT SECOND TOE AMPUTATION;  Surgeon: [name], MD;  Location: [facility];  Service: Orthopedics;  Laterality: Right;  . BACK SURGERY     4  . CARDIAC CATHETERIZATION    . CORONARY STENT INTERVENTION N/A 04/06/2017   Procedure: CORONARY STENT INTERVENTION;  Surgeon: Nigel Mormon, MD;  Location: [facility];  Service: Cardiovascular;  Laterality: N/A;  . CORONARY STENT INTERVENTION N/A 04/07/2017   Procedure: CORONARY STENT INTERVENTION;  Surgeon: Nigel Mormon, MD;  Location: [facility];  Service: Cardiovascular;  Laterality: N/A;  . CYST EXCISION     on Back  . ENDOTRACHEAL INTUBATION EMERGENT  08/07/2018      . JOINT REPLACEMENT    . LAPAROSCOPIC ABDOMINAL EXPLORATION N/A 11/26/2017   Procedure: LAPAROSCOPIC ABDOMINAL EXPLORATION, DRAINAGE OF APPENDICEAL ABCESS. PLACEMENT OF DRAIN;  Surgeon: Alphonsa Overall, MD;  Location: [facility];   Service: General;  Laterality: N/A;  . RIGHT/LEFT HEART CATH AND CORONARY ANGIOGRAPHY N/A 04/06/2017   Procedure: RIGHT/LEFT HEART CATH AND CORONARY ANGIOGRAPHY;  Surgeon: Nigel Mormon, MD;  Location: [facility];  Service: Cardiovascular;  Laterality: N/A;  . TOE SURGERY  05/2018   Left   . TOTAL HIP ARTHROPLASTY     Right    HPI:  This is a 73 y/o with a PMH CAD-es/p stent placement, diastolic CHF, pulm HTN, DM type 2, CKD III. He presented to the ED 3/19 with with fever up to 104.9, generalized weakness, chills and a dry cough within the 24 hours pta. Intubated for ARDS 3/21, extubated 3/25, found to be COVID negative.    Assessment / Plan / Recommendation Clinical Impression  Pt demonstrates normal swallow function, no signs of aspiration with 3 oz water swallow screen. Pt refused solids but anticipated to masticate functionally. RN to advance diet per his wishes, regular texture and thin liquids recommended. He does not want any further PO right now. No SLP f/u needed will sign off.       Aspiration Risk  Mild aspiration risk    Diet Recommendation Regular;Thin liquid   Liquid Administration via: Cup;Straw Medication Administration: Whole meds with liquid Supervision: Patient able to self feed Postural Changes: Seated upright at 90 degrees    Other  Recommendations     Follow up Recommendations        Frequency and Duration  Prognosis        Swallow Study   General HPI: This is a 73 y/o with a PMH CAD-es/p stent placement, diastolic CHF, pulm HTN, DM type 2, CKD III. He presented to the ED 3/19 with with fever up to 104.9, generalized weakness, chills and a dry cough within the 24 hours pta. Intubated for ARDS 3/21, extubated 3/25, found to be COVID negative.  Type of Study: Bedside Swallow Evaluation Diet Prior to this Study: NPO Temperature Spikes Noted: No Respiratory Status: Nasal cannula History of Recent Intubation: Yes Length of  Intubations (days): 5 days Date extubated: 08/11/18 Behavior/Cognition: Alert;Cooperative;Pleasant mood Oral Cavity Assessment: Within Functional Limits Oral Care Completed by SLP: No Oral Cavity - Dentition: Adequate natural dentition Vision: Functional for self-feeding Self-Feeding Abilities: Able to feed self Patient Positioning: Upright in bed Baseline Vocal Quality: Low vocal intensity Volitional Cough: Other (Comment)(pt refused to cough due to pain) Volitional Swallow: Able to elicit    Oral/Motor/Sensory Function Overall Oral Motor/Sensory Function: Within functional limits   Ice Chips     Thin Liquid Thin Liquid: Within functional limits    Nectar Thick Nectar Thick Liquid: Not tested   Honey Thick Honey Thick Liquid: Not tested   Puree Puree: Within functional limits   Solid     Solid: Not tested Other Comments: pt refused     Herbie Baltimore, MA [place] Pager [phone] Office [phone]  Lynann Beaver 08/11/2018,12:17 PM

## 2018-08-11 NOTE — Progress Notes (Deleted)
SLP Cancellation Note  Patient Details Name: David [name] MRN: [id number] DOB: June 07, 1945   Cancelled treatment:       Reason Eval/Treat Not Completed: Patient not medically ready Per health system leadership, therapy services are being held at this time until patient tests negative for COVID-19.  Will discuss screening methods with pts RN.   Herbie Baltimore, MA CCC-SLP  Acute Rehabilitation Services Pager [phone] Office [phone]    Lynann Beaver 08/11/2018, 11:48 AM

## 2018-08-11 NOTE — Progress Notes (Signed)
PULMONARY / CRITICAL CARE MEDICINE   NAME:  David [name], MRN:  [id number], DOB:  04-30-1946, LOS: 6 ADMISSION DATE:  08/05/2018, CONSULTATION DATE:  08/07/2018 REFERRING MD:  Reggy Eye. CHIEF COMPLAINT:  Acute hypoxemic, hypercapneic respiratory failure  BRIEF HISTORY:    This is a 73y. o B M with a PMH CAD-es/p stent placement, diastolic CHF, pulm HTN, DM type 2, CKD III. He presented to the ED 3/19 with with fever up to 104.9, generalized weakness, chills and a dry cough within the 24 hours pta. He denied headache, chest pain, abdominal pain, cough, shortness of breath, vomiting, diarrhea, or rash.  However, the COVID screen did report a dry cough within the 12 hours pta. There were no known sick contacts or recent travel; did have contact with individuals traveling from [place].  Did have a flu vaccination this year. In the ED he was treated for presumptive SIRS and iniitated on Rocephin, Zithromax and vanc 2 g x 1. Procalcitonin was 1.28 on adm and has risen to 59.31 (3/21). WBC has risen from 15.4 on admission to 31.9 today. Initial PCXR (personally reviewed) was somewhat underinflated,with interstitial  infiltrates which worsened on subsequent films  Intubated for ARDS  SIGNIFICANT PAST MEDICAL HISTORY   CAD, es/p stent 11/18 CKD Type II DM  SIGNIFICANT EVENTS:  3/21 tr to ICU & intubated, failed bipap 3/22 heparin gtt for trop pos 3/22 bradycardia with propofol, Versed drip started 3/25 COVID-19 NEGATIVE, extubated   STUDIES:   Post intubation PCXR - bilateral infiltrates c/w ARDS Influenza A/B - neg TTE 3/22 >> low normal systolic function 25-05%, concentric LVH.  Unable to truly assess diastolic function, right heart function  CXR> LLL opacity   CULTURES:  BC 3/18: S. Viridans 1 of 2 Urine pneumococcal antigen 3/19 >> negative Flu 3/22 >> neg RVP 3/22 >> negative Novel CV 3/19 >> Negative  Tracheal asp 3/21 >> no organisms  BCx > likely contaminant viridans  streptococcus BCx 3/24> NG at 24hrs  ANTIBIOTICS:  Vanc 3/19 x1 Cefepime 3/19 x 1 Rocephin 3/19>> 3/22 Zithromax 3/19>> 3/25 Zosyn 3/22 >> 3/25   LINES/TUBES:  ETT 3/21 >>  OGT 3/21 >>   CONSULTANTS:  None  SUBJECTIVE:  -Extubated this morning, doing well  COVID-19 result negative   CONSTITUTIONAL: BP (!) 149/59   Pulse 68   Temp 99.9 F (37.7 C) (Oral)   Resp (!) 22   Ht 6\' 2"  (1.88 m)   Wt 99.8 kg   SpO2 100%   BMI 28.25 kg/m   I/O last 3 completed shifts: In: 3778.7 [P.O.:1600; I.V.:1100.1; NG/GT:871.7; IV Piggyback:207] Out: 4640 [Urine:4390; Emesis/NG output:250]     Vent Mode: PSV;CPAP FiO2 (%):  [40 %] 40 % Set Rate:  [20 bmp] 20 bmp Vt Set:  [490 mL-570 mL] 490 mL PEEP:  [5 cmH20] 5 cmH20 Pressure Support:  [5 cmH20] 5 cmH20 Plateau Pressure:  [19 cmH20-22 cmH20] 19 cmH20  PHYSICAL EXAM: Gen: Adult male, laying in bed, NAD  Neuro: Awake alert oriented x3, following commands. PERRL  HEENT: NCAT pink mmm trachea midline  Lungs: Course breath sounds bilaterally. Symmetrical chest wall expansion,  No accessory muscle recruitment  CV: RRR s1s2 no r/g/m no JVD  Abdomen: Soft, round, ndnt, normoactive x4  Extremities: Symmetrical bulk and tone, trace edema BLE  Skin: clean, dry, warm intact no rash    RESOLVED PROBLEM LIST  Elevated bilirubin, alkaline phosphatase  ASSESSMENT AND PLAN   ARDS.   Suspect due to  CAP, consider ALI due to S. Viridans bacteremia although this is likely contaminant   COVID-19 negative  Pulmonary vascular congestion on CXR Plan: Extubated this morning, doing well O2 support for SpO2 goal >92%  S/p 7 days azithromycin, ceftriaxone x4 zosyn x3  Community-acquired pneumonia -flu negative, RVP negative, novel CV pending. Ur strep Ag negative Procalcitonin improving last 24 hours Plan: Completed 7 day course Azithromycin and 4 day ceftriaxone to 3 day zosyn  Continue to monitor WBC, temperature   Viridans strep  bacteremia.  ? Contaminant vs true pathogen.  TTE 3/22 no evidence endocarditis.  3/24 BCx no growth, supports notion that prior BCx was contaminant  Plan: -abx course completed as above -follow BCx results, given no growth seems likely that prior BCx were contaminant. However if positive may be worth considering TEE   AKI on CKD. Peak creatinine 2.78 on 3/20; Baseline 1.5; Initiate moderate dose diuresis on 3/24 P Cr increase to 1.74 s/p Lasix 40 q8 (3 doses) Restart home torsemide  Follow urine output, CXR, BMP May require additional diuresis   Type II DM.  Plan: Continue SSI per protocol  Macrocytic anemia.  Vitamin B12 reassuring Plan: Folate pending  Apparent distress non-ST elevation MI, troponin peak at 1.37 (3/22) Echocardiogram reassuring Plan: Noted echocardiogram results; unable to assess diastolic fxn Heparin gtt stopped 3/23 Atorvastatin Aspirin 81  HTN with hx chronic diastolic CHF Plan:  Restart home torsemide 10mg  qD  Isosorbide/hydralazine as ordered Metoprolol 25 twice daily Amlodipine 5  Acute encephalopathy-related to sedation Plan: Dc precedex fentanyl gtt  Xanax  ICU delirium precautions   SUMMARY OF TODAY'S PLAN:  COVID-19 negative Extubated today, titrate supplemental O2 PRN Restarting home diuretic regimen, may require additional diuresis however given Cr do not favor additional diuresis at this time Completed 7d course of abx for CAP   Best Practice / Goals of Care / Disposition.   DVT PROPHYLAXIS:SCD/heparin sq NUTRITION:NPO pending swallow eval MOBILITY:BR Code Status: Full  FAMILY DISCUSSIONS: None at bedside  DISPOSITION ICU   Critical Care Time: 45 minutes  Eliseo Gum MSN, AGACNP-BC [facility] [phone] If no answer, [phone] 08/11/2018, 12:05 PM

## 2018-08-11 NOTE — Procedures (Signed)
Extubation Procedure Note  Patient Details:   Name: David [name] DOB: [birth year] MRN: [id number]   Airway Documentation:    Vent end date: (P) 08/11/18 Vent end time: (P) 0932   Evaluation  O2 sats: stable throughout Complications: No apparent complications Patient did tolerate procedure well. Bilateral Breath Sounds: Clear, Diminished   Yes   Positive cuff leak.  Pt placed on [place] 4 L with humidity, no stridor noted.  Pt able to reach 750 mL using incentive spirometer.   Mingo Amber Sherrell Weir 08/11/2018, 11:13 AM

## 2018-08-12 ENCOUNTER — Inpatient Hospital Stay (HOSPITAL_COMMUNITY): Payer: Medicare Other

## 2018-08-12 LAB — CBC
HCT: 24.1 % — ABNORMAL LOW (ref 39.0–52.0)
Hemoglobin: 7.7 g/dL — ABNORMAL LOW (ref 13.0–17.0)
MCH: 34.4 pg — ABNORMAL HIGH (ref 26.0–34.0)
MCHC: 32 g/dL (ref 30.0–36.0)
MCV: 107.6 fL — ABNORMAL HIGH (ref 80.0–100.0)
Platelets: 285 10*3/uL (ref 150–400)
RBC: 2.24 MIL/uL — AB (ref 4.22–5.81)
RDW: 11.9 % (ref 11.5–15.5)
WBC: 16.2 10*3/uL — ABNORMAL HIGH (ref 4.0–10.5)
nRBC: 0 % (ref 0.0–0.2)

## 2018-08-12 LAB — GLUCOSE, CAPILLARY
GLUCOSE-CAPILLARY: 155 mg/dL — AB (ref 70–99)
Glucose-Capillary: 156 mg/dL — ABNORMAL HIGH (ref 70–99)
Glucose-Capillary: 135 mg/dL — ABNORMAL HIGH (ref 70–99)
Glucose-Capillary: 130 mg/dL — ABNORMAL HIGH (ref 70–99)
Glucose-Capillary: 179 mg/dL — ABNORMAL HIGH (ref 70–99)
Glucose-Capillary: 265 mg/dL — ABNORMAL HIGH (ref 70–99)
Glucose-Capillary: 219 mg/dL — ABNORMAL HIGH (ref 70–99)
Glucose-Capillary: 202 mg/dL — ABNORMAL HIGH (ref 70–99)

## 2018-08-12 LAB — BASIC METABOLIC PANEL
ANION GAP: 11 (ref 5–15)
BUN: 10 mg/dL (ref 8–23)
CO2: 22 mmol/L (ref 22–32)
Calcium: 8.4 mg/dL — ABNORMAL LOW (ref 8.9–10.3)
Chloride: 109 mmol/L (ref 98–111)
Creatinine, Ser: 1.83 mg/dL — ABNORMAL HIGH (ref 0.61–1.24)
GFR calc non Af Amer: 36 mL/min — ABNORMAL LOW (ref >60)
GFR, EST AFRICAN AMERICAN: 42 mL/min — AB (ref >60)
Glucose, Bld: 152 mg/dL — ABNORMAL HIGH (ref 70–99)
Potassium: 3.5 mmol/L (ref 3.5–5.1)
Sodium: 142 mmol/L (ref 135–145)

## 2018-08-12 LAB — URIC ACID: Uric Acid, Serum: 4.4 mg/dL (ref 3.7–8.6)

## 2018-08-12 MED ORDER — METOPROLOL TARTRATE 25 MG PO TABS
25.0000 mg | ORAL_TABLET | Freq: Two times a day (BID) | ORAL | Status: DC
  Administered 2018-08-12 – 2018-08-15 (×7): 25 mg via ORAL
  Filled 2018-08-12 (×2): qty 1
  Filled 2018-08-12: qty 2
  Filled 2018-08-12 (×4): qty 1

## 2018-08-12 MED ORDER — ENSURE MAX PROTEIN PO LIQD
11.0000 [oz_av] | Freq: Two times a day (BID) | ORAL | Status: DC
  Administered 2018-08-12: 11 [oz_av] via ORAL
  Filled 2018-08-12 (×7): qty 330

## 2018-08-12 MED ORDER — PANTOPRAZOLE SODIUM 40 MG PO TBEC
40.0000 mg | DELAYED_RELEASE_TABLET | Freq: Every day | ORAL | Status: DC
  Administered 2018-08-12: 40 mg via ORAL
  Filled 2018-08-12: qty 1

## 2018-08-12 MED ORDER — ADULT MULTIVITAMIN W/MINERALS CH
1.0000 | ORAL_TABLET | Freq: Every day | ORAL | Status: DC
  Administered 2018-08-12 – 2018-08-15 (×4): 1 via ORAL
  Filled 2018-08-12 (×4): qty 1

## 2018-08-12 MED ORDER — DICYCLOMINE HCL 10 MG PO CAPS
10.0000 mg | ORAL_CAPSULE | Freq: Three times a day (TID) | ORAL | Status: DC
  Administered 2018-08-12 – 2018-08-15 (×11): 10 mg via ORAL
  Filled 2018-08-12 (×13): qty 1

## 2018-08-12 MED ORDER — INSULIN ASPART 100 UNIT/ML ~~LOC~~ SOLN
0.0000 [IU] | Freq: Three times a day (TID) | SUBCUTANEOUS | Status: DC
  Administered 2018-08-12: 2 [IU] via SUBCUTANEOUS
  Administered 2018-08-12: 3 [IU] via SUBCUTANEOUS
  Administered 2018-08-13: 5 [IU] via SUBCUTANEOUS
  Administered 2018-08-13: 2 [IU] via SUBCUTANEOUS
  Administered 2018-08-14: 6 [IU] via SUBCUTANEOUS

## 2018-08-12 MED ORDER — POTASSIUM CHLORIDE CRYS ER 20 MEQ PO TBCR
40.0000 meq | EXTENDED_RELEASE_TABLET | Freq: Once | ORAL | Status: AC
  Administered 2018-08-12: 40 meq via ORAL
  Filled 2018-08-12: qty 2

## 2018-08-12 NOTE — Progress Notes (Addendum)
Inpatient Diabetes Program Recommendations  AACE/ADA: New Consensus Statement on Inpatient Glycemic Control (2015)  Target Ranges:  Prepandial:   less than 140 mg/dL      Peak postprandial:   less than 180 mg/dL (1-2 hours)      Critically ill patients:  140 - 180 mg/dL   Lab Results  Component Value Date   GLUCAP 265 (H) 08/12/2018   HGBA1C 7.1 (H) 05/28/2018    Review of Glycemic Control  Diabetes history: type 2 Outpatient Diabetes medications: Levemir 20 units daily, Humalog 4 units TID Current orders for Inpatient glycemic control: Novolog SENSITIVE correction scale TID  Inpatient Diabetes Program Recommendations:    Noted that CBG up to 265 mg/dl today at noon. If blood sugars continue to be greater than 180 mg/dl, recommend adding Levemir 10 units daily, continuing Novolog SENSITIVE correction scale TID. Titrate dosage as needed.  Harvel Ricks RN BSN CDE Diabetes Coordinator Pager: [phone]  8am-5pm

## 2018-08-12 NOTE — Evaluation (Signed)
Occupational Therapy Evaluation Patient Details Name: David [name] MRN: [id number] DOB: 1946-01-14 Today's Date: 08/12/2018    History of Present Illness This is a 73y. o B M with a PMH CAD-es/p stent placement, diastolic CHF, pulm HTN, DM type 2, CKD III. He presented to the ED 3/19 with with fever up to 104.9, generalized weakness, chills and a dry cough within the 24 hours pta   Clinical Impression   Pt PTA: per spouse via phone convo with PT, pt was mostly independent with ADL; falls often and short term memory is fair. 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 Pt currently, following 1 step commands; minA for UB ADL and modA for LB ADL- overall modA. Pt ambulating with knees flexed and multiple cues to stand upright with RW and pt would slouch very easily. Pt performing transfers with modA +2. Pt requires chair follow for safety. Pt would greatly benefit from continued OT skilled services for ADL, mobility and safety in SNF setting vs [facility] setting. OT to follow acutely.  O2 >90% on RA and HR  <120 BPM wth activity.    Follow Up Recommendations  SNF;Supervision/Assistance - 24 hour    Equipment Recommendations  3 in 1 bedside commode    Recommendations for Other Services       Precautions / Restrictions Precautions Precautions: Fall Restrictions Weight Bearing Restrictions: No      Mobility Bed Mobility Overal bed mobility: Needs Assistance Bed Mobility: Sit to Supine       Sit to supine: Supervision   General bed mobility comments: increased time and effort  Transfers Overall transfer level: Modified independent Equipment used: Rolling walker (2 wheeled)             General transfer comment: min A x2 to stand    Balance Overall balance assessment: Needs assistance;History of Falls   Sitting balance-Leahy Scale: Fair       Standing balance-Leahy Scale: Poor                             ADL either performed or assessed with clinical  judgement   ADL Overall ADL's : Needs assistance/impaired Eating/Feeding: Set up;Sitting   Grooming: Minimal assistance;Wash/dry hands;Wash/dry face;Oral care;Sitting   Upper Body Bathing: Minimal assistance;Sitting   Lower Body Bathing: Moderate assistance;Sitting/lateral leans;Sit to/from stand   Upper Body Dressing : Minimal assistance;Sitting   Lower Body Dressing: Moderate assistance;Sitting/lateral leans;Sit to/from stand   Toilet Transfer: Moderate assistance;Stand-pivot   Toileting- Clothing Manipulation and Hygiene: Moderate assistance;Sitting/lateral lean;Sit to/from stand       Functional mobility during ADLs: Minimal assistance;Rolling walker(unsafe, LOB epiosdes, knees flexed) General ADL Comments: Pt performing ADL functional mobility with minA; UB minA; LB ADL. Follwos 1 step commands, but history info was not matched to [employer]. per spouse "pt tells you what you want to hear."     Vision Baseline Vision/History: Wears glasses Vision Assessment?: No apparent visual deficits     Perception     Praxis      Pertinent Vitals/Pain       Hand Dominance Right   Extremity/Trunk Assessment Upper Extremity Assessment Upper Extremity Assessment: Generalized weakness   Lower Extremity Assessment Lower Extremity Assessment: Generalized weakness   Cervical / Trunk Assessment Cervical / Trunk Assessment: Normal   Communication Communication Communication: No difficulties   Cognition Arousal/Alertness: Awake/alert Behavior During Therapy: Impulsive Overall Cognitive Status: History of cognitive impairments - at baseline  General Comments: mild cognitive deficits per wife, memory.    General Comments  Pt following commands with decreased accuracy and spouse able to confirm that pt was not telling the truth about DME at home.     Exercises     Shoulder Instructions      Home Living Family/patient  expects to be discharged to:: Private residence Living Arrangements: Spouse/significant other Available Help at Discharge: Family;Available PRN/intermittently Type of Home: House Home Access: Stairs to enter [employer] of Steps: 3 Entrance Stairs-Rails: Right;Left;Can reach both Home Layout: Two level Alternate Level Stairs-Number of Steps: 13 Alternate Level Stairs-Rails: Right Bathroom Shower/Tub: Teacher, early years/pre: Standard     Home Equipment: Environmental consultant - 2 wheels;Cane - quad;Shower seat   Additional Comments: per wife via phonecall, patient has had multiple falls at home      Prior Functioning/Environment Level of Independence: Independent with assistive device(s)        Comments: used cane before surgery now using RW        OT Problem List: Decreased strength;Decreased activity tolerance;Impaired balance (sitting and/or standing);Decreased safety awareness      OT Treatment/Interventions: Self-care/ADL training;Therapeutic exercise;Neuromuscular education;Energy conservation;Therapeutic activities;DME and/or AE instruction;Patient/family education;Balance training    OT Goals(Current goals can be found in the care plan section) Acute Rehab OT Goals Patient Stated Goal: go home OT Goal Formulation: With patient Time For Goal Achievement: 08/26/18 Potential to Achieve Goals: Good ADL Goals Pt Will Perform Grooming: with modified independence;sitting Pt Will Perform Lower Body Dressing: with modified independence;with adaptive equipment Pt Will Transfer to Toilet: with supervision;ambulating Additional ADL Goal #1: Pt will perform ADL functional transfers and ADL functional mobility with supervisionA using least restrictive device.  OT Frequency: Min 3X/week   Barriers to D/C:            Co-evaluation              AM-PAC OT "6 Clicks" Daily Activity     Outcome Measure Help from another person eating meals?: A Little Help from  another person taking care of personal grooming?: A Little Help from another person toileting, which includes using toliet, bedpan, or urinal?: A Lot Help from another person bathing (including washing, rinsing, drying)?: A Lot Help from another person to put on and taking off regular upper body clothing?: A Little Help from another person to put on and taking off regular lower body clothing?: A Lot 6 Click Score: 15   End of Session Equipment Utilized During Treatment: Gait belt;Rolling walker Nurse Communication: Mobility status  Activity Tolerance: Patient tolerated treatment well Patient left: in chair;with call bell/phone within reach;with chair alarm set  OT Visit Diagnosis: Unsteadiness on feet (R26.81);Muscle weakness (generalized) (M62.81)                Time: 8325-4982 OT Time Calculation (min): 38 min Charges:  OT General Charges $OT Visit: 1 Visit OT Evaluation $OT Eval Moderate Complexity: 1 Mod  Darryl Nestle) Marsa Aris OTR/L Acute Rehabilitation Services Pager: [phone] Office: [phone]   Fredda Hammed 08/12/2018, 3:06 PM

## 2018-08-12 NOTE — Progress Notes (Signed)
Patient refused ensure max

## 2018-08-12 NOTE — TOC Initial Note (Signed)
Transition of Care [facility]) - Initial/Assessment Note    Patient Details  Name: David [name] MRN: [id number] Date of Birth: 1946-04-29  Transition of [facility]) CM/SW Contact:    Maryclare Labrador, RN Phone Number: 08/12/2018, 1:21 PM  Clinical Narrative:   PTA independent from home with wife, pt has PCP and wife denied barriers with paying for medications as prescribed.  PT has quad cane and RW in the home however per wife pt refuses to use RW.   TOC following for discharge planning                Expected Discharge Plan: [place]     Patient Goals and CMS Choice        Expected Discharge Plan and Services Expected Discharge Plan: [place] arrangements for the past 2 months: Single Family Home                          Prior Living Arrangements/Services Living arrangements for the past 2 months: Single Family Home Lives with:: Spouse          Need for Family Participation in Patient Care: Yes (Comment) Care giver support system in place?: Yes (comment)(wife)      Activities of Daily Living Home Assistive Devices/Equipment: None ADL Screening (condition at time of admission) Patient's cognitive ability adequate to safely complete daily activities?: Yes Is the patient deaf or have difficulty hearing?: No Does the patient have difficulty seeing, even when wearing glasses/contacts?: No Does the patient have difficulty concentrating, remembering, or making decisions?: Yes Patient able to express need for assistance with ADLs?: Yes Does the patient have difficulty dressing or bathing?: Yes Independently performs ADLs?: Yes (appropriate for developmental age) Does the patient have difficulty walking or climbing stairs?: Yes Weakness of Legs: Both Weakness of Arms/Hands: Both  Permission Sought/Granted                  Emotional Assessment              Admission diagnosis:  Fever  [R50.9] Pneumonia of both lower lobes due to infectious organism [facility]) [J18.1] Acute renal failure superimposed on chronic kidney disease, unspecified CKD stage, unspecified acute renal failure type ([place]) [N17.9, N18.9] Patient Active Problem List   Diagnosis Date Noted  . AKI (acute kidney injury) ([place]) 08/06/2018  . Bilateral carotid artery stenosis 07/30/2018  . Coronary artery disease involving native coronary artery of native heart without angina pectoris 07/30/2018  . Snoring 02/11/2018  . Chronic heart failure with preserved ejection fraction (HFpEF) ([place]) 12/22/2017  . At risk for adverse drug reaction 12/15/2017  . Chronic pain syndrome 12/15/2017  . Status post coronary artery stent placement   . Multifocal pneumonia 03/30/2017  . Insulin-requiring or dependent type II diabetes mellitus ([employer]) 03/29/2017  . Acute on chronic respiratory failure with hypoxia ([place])   . Pulmonary congestion   . Acquired contracture of Achilles tendon, right 08/19/2016  . Amputated great toe, right ([place]) 07/18/2016  . Onychomycosis 05/29/2016  . Diabetic polyneuropathy associated with type 2 diabetes mellitus ([place]) 04/09/2016  . Right foot ulcer, limited to breakdown of skin ([place]) 04/09/2016  . Unspecified hereditary and idiopathic peripheral neuropathy 09/15/2013  . Malaise 08/14/2013  . Hypertension 08/14/2013  . Chronic back pain 08/14/2013  . Glaucoma 08/14/2013  . Headache 08/14/2013   PCP:  [name], MD Pharmacy:  [facility] [place], [place] [address] [place] [address] Phone: [phone] Fax: [phone]  [id number] [place], [place] - [address] AT [facility] [address] [place] [address] Phone: [phone] Fax: [phone]     Social Determinants of Health (SDOH) Interventions    Readmission Risk Interventions Readmission Risk Prevention Plan 08/12/2018   Transportation Screening Complete  HRI or Home Care Consult Complete  Palliative Care Screening Complete  Some recent data might be hidden

## 2018-08-12 NOTE — Progress Notes (Signed)
Nutrition Follow-up RD working remotely.  DOCUMENTATION CODES:   Not applicable  INTERVENTION:    Ensure Max po BID, each supplement provides 150 kcal and 30 grams of protein.   Multivitamin daily  NUTRITION DIAGNOSIS:   Increased nutrient needs related to wound healing as evidenced by estimated needs.  Ongoing  GOAL:   Patient will meet greater than or equal to 90% of their needs  Progressing  MONITOR:   PO intake, Supplement acceptance, Labs, Skin  ASSESSMENT:   73 yo male with PMH of CAD, diastolic CHF, pulmonary HTN, DM-2, CKD III who presented with fever up to 104.9, generalized weakness and dry cough within last 24 hours. Pt admitted with PNA, AKI, and fever.  Extubated 3/25. Diet has been advanced to heart healthy, CHO modified. No meal completion recorded. Unable to speak with patient or RN at this time. Will add PO supplement to ensure adequate intake of protein to support wound healing.  Labs reviewed. CBG's: [phone] Medications reviewed and include Novolog.  Diet Order:   Diet Order            Diet heart healthy/carb modified Room service appropriate? Yes; Fluid consistency: Thin  Diet effective now              EDUCATION NEEDS:   Not appropriate for education at this time  Skin:  Skin Assessment: Skin Integrity Issues: Skin Integrity Issues:: Diabetic Ulcer Diabetic Ulcer: right foot  Last BM:  3/26 (type 4)  Height:   Ht Readings from Last 1 Encounters:  08/05/18 6\' 2"  (1.88 m)    Weight:   Wt Readings from Last 1 Encounters:  08/12/18 95.9 kg    Ideal Body Weight:  86.4 kg  BMI:  Body mass index is 27.14 kg/m.  Estimated Nutritional Needs:   Kcal:  2200-2400  Protein:  120-140 gm  Fluid:  2.2 L    Molli Barrows, RD, LDN, [place] Pager [phone] After Hours Pager [phone]

## 2018-08-12 NOTE — Evaluation (Signed)
Physical Therapy Evaluation Patient Details Name: David [name] MRN: [id number] DOB: 05-10-1946 Today's Date: 08/12/2018   History of Present Illness  This is a 73y. o B M with a PMH CAD-es/p stent placement, diastolic CHF, pulm HTN, DM type 2, CKD III. He presented to the ED 3/19 with with fever up to 104.9, generalized weakness, chills and a dry cough within the 24 hours pta    Clinical Impression  Pt admitted with above diagnosis. Pt currently with functional limitations due to the deficits listed below (see PT Problem List). PTA, pt living with wife. Denies falls however per wife on phone has had several. Walks with quad cane or RW at home. Today, ambulating near baseline with unsteadiness and chair follow. Unsafe for OOB mobility without supervision relied this information to wife. Pt and wife wish to return home.   Pt will benefit from skilled PT to increase their independence and safety with mobility to allow discharge to the venue listed below.    50' of gait de sat to 85% on RA.     Follow Up Recommendations SNF(if patient declines, HHPT next best option. )    Equipment Recommendations  None recommended by PT    Recommendations for Other Services       Precautions / Restrictions Precautions Precautions: Fall Restrictions Weight Bearing Restrictions: No      Mobility  Bed Mobility Overal bed mobility: Needs Assistance Bed Mobility: Sit to Supine       Sit to supine: Supervision   General bed mobility comments: increased time and effort  Transfers Overall transfer level: Modified independent Equipment used: Rolling walker (2 wheeled)             General transfer comment: min A x2 to stand  Ambulation/Gait Ambulation/Gait assistance: Min assist Gait Distance (Feet): 40 Feet(20' 20') Assistive device: Rolling walker (2 wheeled) Gait Pattern/deviations: Step-to pattern Gait velocity: decreased   General Gait Details: chair follow as pt fatigues and  needs to sit quickly. pt with usnteady gait, flexed knees, short step length. cues for posture  Stairs            Wheelchair Mobility    Modified Rankin (Stroke Patients Only)       Balance Overall balance assessment: Needs assistance;History of Falls   Sitting balance-Leahy Scale: Fair       Standing balance-Leahy Scale: Poor                               Pertinent Vitals/Pain      Home Living Family/patient expects to be discharged to:: Private residence Living Arrangements: Spouse/significant other Available Help at Discharge: Family;Available PRN/intermittently Type of Home: House Home Access: Stairs to enter Entrance Stairs-Rails: Right;Left;Can reach both Entrance Stairs-Number of Steps: 3 Home Layout: Two level Home Equipment: Walker - 2 wheels;Cane - quad;Shower seat Additional Comments: per wife via phonecall, patient has had multiple falls at home    Prior Function Level of Independence: Independent with assistive device(s)         Comments: used cane before surgery now using RW     Hand Dominance   Dominant Hand: Right    Extremity/Trunk Assessment   Upper Extremity Assessment Upper Extremity Assessment: Generalized weakness    Lower Extremity Assessment Lower Extremity Assessment: Generalized weakness       Communication   Communication: No difficulties  Cognition Arousal/Alertness: Awake/alert Behavior During Therapy: Impulsive Overall Cognitive Status: History of  cognitive impairments - at baseline                                 General Comments: mild cognitive deficits per wife, memory.       General Comments      Exercises     Assessment/Plan    PT Assessment Patient needs continued PT services  PT Problem List Decreased strength       PT Treatment Interventions DME instruction;Gait training;Stair training;Functional mobility training;Therapeutic activities;Therapeutic exercise;Balance  training    PT Goals (Current goals can be found in the Care Plan section)  Acute Rehab PT Goals Patient Stated Goal: go home PT Goal Formulation: With patient/family Time For Goal Achievement: 08/26/18 Potential to Achieve Goals: Fair    Frequency Min 3X/week   Barriers to discharge Inaccessible home environment sleeps on 2nd story    Co-evaluation               AM-PAC PT "6 Clicks" Mobility  Outcome Measure Help needed turning from your back to your side while in a flat bed without using bedrails?: None Help needed moving from lying on your back to sitting on the side of a flat bed without using bedrails?: A Little Help needed moving to and from a bed to a chair (including a wheelchair)?: A Little Help needed standing up from a chair using your arms (e.g., wheelchair or bedside chair)?: A Little Help needed to walk in hospital room?: A Little Help needed climbing 3-5 steps with a railing? : A Lot 6 Click Score: 18    End of Session Equipment Utilized During Treatment: Gait belt Activity Tolerance: Patient tolerated treatment well Patient left: with call bell/phone within reach;in chair;with chair alarm set Nurse Communication: Mobility status PT Visit Diagnosis: Unsteadiness on feet (R26.81)    Time: 1017-5102 PT Time Calculation (min) (ACUTE ONLY): 58 min   Charges:   PT Evaluation $PT Eval Low Complexity: 1 Low PT Treatments $Gait Training: 8-22 mins       Reinaldo Berber, PT, DPT Acute Rehabilitation Services Pager: [phone] Office: [place] 08/12/2018, 12:20 PM

## 2018-08-12 NOTE — Progress Notes (Signed)
[facility] Progress Note Patient Name: DHILLON COMUNALE DOB: 1946/05/18 MRN: [id number]   Date of Service  08/12/2018  HPI/Events of Note  Right wrist pain without obvious trauma.  eICU Interventions  Right wrist xray and Uric acid level ordered        Shannon Kirkendall U Tayshaun Kroh 08/12/2018, 12:05 AM

## 2018-08-12 NOTE — Progress Notes (Signed)
PULMONARY / CRITICAL CARE MEDICINE   NAME:  David [name], MRN:  [id number], DOB:  09/05/1945, LOS: 7 ADMISSION DATE:  08/05/2018, CONSULTATION DATE:  08/07/2018 REFERRING MD:  Reggy Eye. CHIEF COMPLAINT:  Acute hypoxemic, hypercapneic respiratory failure  BRIEF HISTORY:    This is a 73y. o B M with a PMH CAD-es/p stent placement, diastolic CHF, pulm HTN, DM type 2, CKD III. He presented to the ED 3/19 with with fever up to 104.9, generalized weakness, chills and a dry cough within the 24 hours pta. He denied headache, chest pain, abdominal pain, cough, shortness of breath, vomiting, diarrhea, or rash.  However, the COVID screen did report a dry cough within the 12 hours pta. There were no known sick contacts or recent travel; did have contact with individuals traveling from [place].  Did have a flu vaccination this year. In the ED he was treated for presumptive SIRS and iniitated on Rocephin, Zithromax and vanc 2 g x 1. Procalcitonin was 1.28 on adm and has risen to 59.31 (3/21). WBC has risen from 15.4 on admission to 31.9 today. Initial PCXR (personally reviewed) was somewhat underinflated,with interstitial  infiltrates which worsened on subsequent films  Intubated for ARDS  SIGNIFICANT PAST MEDICAL HISTORY   CAD, es/p stent 11/18 CKD Type II DM  SIGNIFICANT EVENTS:  3/21 tr to ICU & intubated, failed bipap 3/22 heparin gtt for trop pos 3/22 bradycardia with propofol, Versed drip started 3/25 COVID-19 NEGATIVE, extubated   STUDIES:   Post intubation PCXR - bilateral infiltrates c/w ARDS Influenza A/B - neg TTE 3/22 >> low normal systolic function 90-30%, concentric LVH.  Unable to truly assess diastolic function, right heart function  CXR> LLL opacity   CULTURES:  BC 3/18: S. Viridans 1 of 2 Urine pneumococcal antigen 3/19 >> negative Flu 3/22 >> neg RVP 3/22 >> negative Novel CV 3/19 >> Negative  Tracheal asp 3/21 >> no organisms  BCx > likely contaminant viridans  streptococcus BCx 3/24> NG at 24hrs  ANTIBIOTICS:  Vanc 3/19 x1 Cefepime 3/19 x 1 Rocephin 3/19>> 3/22 Zithromax 3/19>> 3/25 Zosyn 3/22 >> 3/25   LINES/TUBES:  ETT 3/21 >>  OGT 3/21 >>   CONSULTANTS:  None  SUBJECTIVE:  24 hours post extubation no acute distress meets criteria for moving to floor   CONSTITUTIONAL: BP (!) 176/73   Pulse 91   Temp 99.5 F (37.5 C) (Axillary)   Resp 16   Ht 6\' 2"  (1.88 m)   Wt 95.9 kg   SpO2 100%   BMI 27.14 kg/m   I/O last 3 completed shifts: In: 1199.3 [P.O.:390; I.V.:256.9; NG/GT:440; IV Piggyback:112.4] Out: 3865 [Urine:3865]        PHYSICAL EXAM: General: Awake alert no acute distress HEENT: JVD or lymphadenopathy is appreciated Neuro: Follows commands CV: Heart sounds are regular, currently in sinus rhythm with a rate of 85 blood pressure was systolic 092 PULM: even/non-labored, lungs bilaterally diminished in the bases ZR:AQTM, non-tender, bsx4 active  Extremities: warm/dry, right hand extremely tender x-ray reveals severe degenerative joint changes Skin: no rashes or lesions    RESOLVED PROBLEM LIST  Elevated bilirubin, alkaline phosphatase  ASSESSMENT AND PLAN   ARDS.   Suspect due to CAP, consider ALI due to S. Viridans bacteremia although this is likely contaminant   COVID-19 negative  Pulmonary vascular congestion on CXR Plan: Extubated 08/11/2018 Continue diuresis Transfer to floor and to [facility] service  Community-acquired pneumonia -flu negative, RVP negative, novel CV pending. Ur strep Ag negative  Procalcitonin improving last 24 hours Plan: Pleated 7-day course of antimicrobial therapy  Viridans strep bacteremia.  ? Contaminant vs true pathogen.  TTE 3/22 no evidence endocarditis.  3/24 BCx no growth, supports notion that prior BCx was contaminant  Plan: Completed antimicrobials  AKI on CKD. Peak creatinine 2.78 on 3/20; Baseline 1.5; Lab Results  Component Value Date   CREATININE 1.83 (H)  08/12/2018   CREATININE 1.74 (H) 08-28-2018   CREATININE 1.56 (H) 08/10/2018    P Resume home diuretic Monitor creatinine  Type II DM.  Plan: Sliding scale insulin protocol  Macrocytic anemia.  Vitamin B12 reassuring Plan: Folate level 8.8  Apparent distress non-ST elevation MI, troponin peak at 1.37 (3/22) Echocardiogram reassuring Plan: Continue aspirin 81 mg Continue statin  HTN with hx chronic diastolic CHF Plan:  Resume home diuretics and hypertensive   Acute encephalopathy-related to sedation Plan: Currently resolved  SUMMARY OF TODAY'S PLAN:  Coded-19- negative Debated 08-28-18 doing well transfer to floor Note complaining of right hand pain x-ray of hand negative shows severe arthritic changes  Best Practice / Goals of Care / Disposition.   DVT PROPHYLAXIS:SCD/heparin sq NUTRITION: Advance diet as passed swallow evaluation MOBILITY:BR Code Status: Full  FAMILY DISCUSSIONS: 08/12/2018 patient updated at bedside DISPOSITION transfer to telemetry to [facility] service 08/13/2018   Richardson Landry Lakelyn Straus ACNP Maryanna Shape PCCM Pager [phone] till 1 pm If no answer page [phone] 08/12/2018, 10:05 AM

## 2018-08-13 ENCOUNTER — Inpatient Hospital Stay (HOSPITAL_COMMUNITY): Payer: Medicare Other

## 2018-08-13 DIAGNOSIS — I2510 Atherosclerotic heart disease of native coronary artery without angina pectoris: Secondary | ICD-10-CM

## 2018-08-13 LAB — GLUCOSE, CAPILLARY
Glucose-Capillary: 185 mg/dL — ABNORMAL HIGH (ref 70–99)
Glucose-Capillary: 265 mg/dL — ABNORMAL HIGH (ref 70–99)
Glucose-Capillary: 201 mg/dL — ABNORMAL HIGH (ref 70–99)

## 2018-08-13 NOTE — Progress Notes (Signed)
Noted patient does not want staff in the room. CM attempted to call patient on the telephone but its off the hook. CM talked to Trixie RN to ask patient if he wanted to talk to the CM about DCP; CM was informed that he does not want to talk to the CM and does not want any assistance. CM signing off at this time. If patient changed his mind and wants assistance at home please contact CM; B Pennie Rushing [phone]

## 2018-08-13 NOTE — Progress Notes (Signed)
OT Cancellation Note  Patient Details Name: David [name] MRN: [id number] DOB: 17-Apr-1946   Cancelled Treatment:    Reason Eval/Treat Not Completed: Patient declined, no reason specified. Despite max education, information about importance of safety for him AND his wife, Pt declined working with OT. When asked how he would perform ADL tasks at home he stated "I'll figure it out or my wife will do it for me" OT told him we would be back tomorrow to attempt working with him and that we are still recommending that he go to skilled rehab.  [name] [name] 08/13/2018, 4:30 PM  [name] OTR/L Acute Rehabilitation Services Pager: [phone] Office: [phone]

## 2018-08-13 NOTE — Progress Notes (Signed)
Patient refusing morning labs and vitals. States he does not want to be bothered. Lab tech asked by this RN to come back later and try. RN will attempt to get vitals later. Patient is in bed resting. No complaints at this time. Will continue to monitor.

## 2018-08-13 NOTE — Progress Notes (Signed)
Patient refusing morning CBG. Does not want staff in his room.

## 2018-08-13 NOTE — Care Management Important Message (Signed)
Important Message  Patient Details  Name: David [name] MRN: [id number] Date of Birth: 01/27/1946   Medicare Important Message Given:  Yes    Orbie Pyo 08/13/2018, 4:08 PM

## 2018-08-13 NOTE — Progress Notes (Signed)
Inpatient Diabetes Program Recommendations  AACE/ADA: New Consensus Statement on Inpatient Glycemic Control (2015)  Target Ranges:  Prepandial:   less than 140 mg/dL      Peak postprandial:   less than 180 mg/dL (1-2 hours)      Critically ill patients:  140 - 180 mg/dL   Results for HOA, DERISO (MRN [id number]) as of 08/13/2018 11:04  Ref. Range 08/12/2018 07:38 08/12/2018 11:30 08/12/2018 13:50 08/12/2018 16:12 08/12/2018 21:18  Glucose-Capillary Latest Ref Range: 70 - 99 mg/dL 130 (H) 179 (H) 265 (H) 219 (H) 202 (H)   Review of Glycemic Control  Diabetes history: type 2 Outpatient Diabetes medications: Levemir 20 units daily, Humalog 4 units TID Current orders for Inpatient glycemic control: Novolog SENSITIVE correction scale TID  Inpatient Diabetes Program Recommendations:     Noted Patient refusing am CBG. Glucose trends seem to increase after meals, patient is also on a nutritional supplement (Ensure max) which does not influence glucose trends. If patient is consuming at least 50% of meals may can consider low dose meal coverage, Novolog 2 units tid.  Thanks,  Tama Headings RN, MSN, BC-ADM Inpatient Diabetes Coordinator Team Pager [phone] (8a-5p)

## 2018-08-13 NOTE — Progress Notes (Signed)
Physical Therapy Treatment Patient Details Name: David [name] MRN: [id number] DOB: Feb 27, 1946 Today's Date: 08/13/2018    History of Present Illness This is a 73y. o B M with a PMH CAD-es/p stent placement, diastolic CHF, pulm HTN, DM type 2, CKD III. He presented to the ED 3/19 with with fever up to 104.9, generalized weakness, chills and a dry cough within the 24 hours pta    PT Comments    Patient initially agreeable to therapy session after scheduling with this PT earlier this AM but then not cooperative or willing. Irritated when asked any questions. Pt with definite cognitive deficits- impulsive with poor awareness of safety, deficits and situation. Pt urgently getting out of bed to get to bathroom despite not waiting for PT to get RW and incontinent of urine requiring min A due to imbalance and grabbing onto furniture. Right hand swelling noted as well as tenderness and pain. Pt sat EOB donning shoes/socks with assist and performing mini clean up after incontinence however refuses any further mobility. Not safe to be home without 24/7 supervision/hands on assist. Continue to recommend SNF as pt HIGH FALL RISK. Will follow.   Follow Up Recommendations  SNF;Supervision for mobility/OOB;Supervision/Assistance - 24 hour(if pt declines, HH services)     Equipment Recommendations  None recommended by PT    Recommendations for Other Services       Precautions / Restrictions Precautions Precautions: Fall Restrictions Weight Bearing Restrictions: No RUE Weight Bearing: Weight bearing as tolerated    Mobility  Bed Mobility Overal bed mobility: Modified Independent Bed Mobility: Supine to Sit     Supine to sit: Modified independent (Device/Increase time);HOB elevated     General bed mobility comments: Impulsive due to needing to use the restroom; not waiting for RW, almost falling onto counter, unsafe.   Transfers Overall transfer level: Needs assistance Equipment used:  Rolling walker (2 wheeled) Transfers: Sit to/from Stand Sit to Stand: Min guard         General transfer comment: Min guard for safety in standing; not able to use RLE. Impulsive.   Ambulation/Gait Ambulation/Gait assistance: Min assist Gait Distance (Feet): 15 Feet(x2 bouts) Assistive device: None Gait Pattern/deviations: Trunk flexed;Step-through pattern Gait velocity: increased   General Gait Details: Urgently getting to bathroom requiring Min A due to imbalance and poor safety. Hunched over grabbing onto furniture.    Stairs             Wheelchair Mobility    Modified Rankin (Stroke Patients Only)       Balance Overall balance assessment: Needs assistance;History of Falls Sitting-balance support: Feet supported;No upper extremity supported Sitting balance-Leahy Scale: Fair Sitting balance - Comments: Able to assist donning left shoe but no right due to right hand pain/swelling   Standing balance support: During functional activity Standing balance-Leahy Scale: Poor                              Cognition Arousal/Alertness: Awake/alert Behavior During Therapy: Impulsive(irritated) Overall Cognitive Status: No family/caregiver present to determine baseline cognitive functioning                                 General Comments: mild cognitive deficits per wife, memory.  pt irritated with all questions asked. Scheduled a time to come see him and pt initially agreeable but then sat EOB for 10 mins stating he is  not ready and only will get to chair. Impulsively running to bathroom and incontinent of urine on the way, very unsafe. No awareness of deficits/safety. "my hand is swollen cause a tech in ICU was trying to get my blood and i said no and pulled me on the floor and it broke my fall."       Exercises      General Comments        Pertinent Vitals/Pain Pain Assessment: Faces Pain Score: 10-Worst pain ever Pain Location: Right  hand with movement Pain Descriptors / Indicators: Sore;Guarding;Grimacing;Moaning;Tender Pain Intervention(s): Monitored during session;Repositioned;Limited activity within patient's tolerance    Home Living                      Prior Function            PT Goals (current goals can now be found in the care plan section) Progress towards PT goals: Not progressing toward goals - comment(secondary to pt not being cooperative/cognition?)    Frequency    Min 3X/week      PT Plan Current plan remains appropriate    Co-evaluation              AM-PAC PT "6 Clicks" Mobility   Outcome Measure  Help needed turning from your back to your side while in a flat bed without using bedrails?: None Help needed moving from lying on your back to sitting on the side of a flat bed without using bedrails?: A Little Help needed moving to and from a bed to a chair (including a wheelchair)?: A Little Help needed standing up from a chair using your arms (e.g., wheelchair or bedside chair)?: A Little Help needed to walk in hospital room?: A Little Help needed climbing 3-5 steps with a railing? : A Lot 6 Click Score: 18    End of Session Equipment Utilized During Treatment: Gait belt Activity Tolerance: Other (comment)(not motivated or willing despite dictating therapy time and planning prior too, refusing all care) Patient left: in chair;with call bell/phone within reach;with chair alarm set Nurse Communication: Mobility status PT Visit Diagnosis: Unsteadiness on feet (R26.81)     Time: 1200-1230 PT Time Calculation (min) (ACUTE ONLY): 30 min  Charges:  $Therapeutic Activity: 23-37 mins                     Wray Kearns, PT, DPT Acute Rehabilitation Services Pager [phone] Office [phone]       David [name] 08/13/2018, 12:55 PM

## 2018-08-13 NOTE — Progress Notes (Signed)
Patient has been refusing lab draw 3x this morning. MD made aware.

## 2018-08-13 NOTE — Progress Notes (Signed)
Pt refusing bed alarm.

## 2018-08-13 NOTE — Plan of Care (Signed)
  Problem: Clinical Measurements: Goal: Ability to maintain clinical measurements within normal limits will improve Outcome: Progressing Goal: Will remain free from infection Outcome: Progressing Goal: Diagnostic test results will improve Outcome: Progressing Goal: Respiratory complications will improve Outcome: Progressing Goal: Cardiovascular complication will be avoided Outcome: Progressing   Problem: Health Behavior/Discharge Planning: Goal: Ability to manage health-related needs will improve Outcome: Progressing   Problem: Clinical Measurements: Goal: Ability to maintain clinical measurements within normal limits will improve Outcome: Progressing Goal: Will remain free from infection Outcome: Progressing Goal: Diagnostic test results will improve Outcome: Progressing Goal: Respiratory complications will improve Outcome: Progressing Goal: Cardiovascular complication will be avoided Outcome: Progressing   Problem: Activity: Goal: Risk for activity intolerance will decrease Outcome: Progressing   Problem: Nutrition: Goal: Adequate nutrition will be maintained Outcome: Progressing   Problem: Elimination: Goal: Will not experience complications related to bowel motility Outcome: Progressing Goal: Will not experience complications related to urinary retention Outcome: Progressing   Problem: Pain Managment: Goal: General experience of comfort will improve Outcome: Progressing   Problem: Safety: Goal: Ability to remain free from injury will improve Outcome: Progressing   Problem: Skin Integrity: Goal: Risk for impaired skin integrity will decrease Outcome: Progressing   Problem: Activity: Goal: Ability to tolerate increased activity will improve Outcome: Progressing   Problem: Clinical Measurements: Goal: Ability to maintain a body temperature in the normal range will improve Outcome: Progressing   Problem: Respiratory: Goal: Ability to maintain adequate  ventilation will improve Outcome: Progressing Goal: Ability to maintain a clear airway will improve Outcome: Progressing   Problem: Activity: Goal: Ability to tolerate increased activity will improve Outcome: Progressing   Problem: Respiratory: Goal: Ability to maintain a clear airway and adequate ventilation will improve Outcome: Progressing   Problem: Role Relationship: Goal: Method of communication will improve Outcome: Progressing

## 2018-08-13 NOTE — Progress Notes (Signed)
PROGRESS NOTE    David [name]  RWE:[id number] DOB: 1945-05-29 DOA: 08/05/2018 PCP: [name], MD   Brief Narrative:  The patient is ana 73y. o B M with a PMH CAD s/p stent placement, diastolic CHF, pulm HTN, DM type 2, CKD III along with other comorbidites who presented to the ED 3/19 with with fever up to 104.9, generalized weakness, chills and a dry cough within the 24 hours pta. He denied headache, chest pain, abdominal pain, cough, shortness of breath, vomiting, diarrhea, or rash. However, the COVID screen did report a dry cough within the 12 hours pta. There were no known sick contacts or recent travel; did have contact with individuals traveling from [place]. Did have a flu vaccination this year.  In the ED he was treated for presumptive SIRS and initated on Rocephin, Zithromax and vanc 2 g x 1. Procalcitonin was 1.28 on adm and had risen to 59.31 (3/21). WBC had risen from 15.4 on admission to 31.9 . Initial PCXR was somewhat underinflated,with interstitial  infiltrates which worsened on subsequent films. He was Intubated for ARDS on 08/07/2018 and then treated and extubated on 3/25. COVID-19 SARS Co-V2 was Negative. He was Transitioned to [facility] on 08/13/2018 and has been refusing all interventions and threatening to leave AMA.   Assessment & Plan:   Principal Problem:   Multifocal pneumonia Active Problems:   Insulin-requiring or dependent type II diabetes mellitus ([place])   Acute on chronic respiratory failure with hypoxia (HCC)   Chronic heart failure with preserved ejection fraction (HFpEF) (HCC)   Bilateral carotid artery stenosis   Coronary artery disease involving native coronary artery of native heart without angina pectoris   AKI (acute kidney injury) ([place])  ARDS from Multifocal PNA -Suspected due to CAP, consider ALI due to S. Viridans bacteremia although this is likely contaminant   -COVID-19 negative  -Had Pulmonary vascular congestion on CXR; Repeat CXR  this AM showed "Stable top-normal heart size. Low bilateral lung volumes. Mild residual interstitial prominence without overt edema, focal airspace consolidation, pleural fluid or pneumothorax." -Extubated 08/11/2018 -Continue diuresis and is now on Torsemide -Transferred to [place] on 08/13/2018 -C/w Albuterol 2.5 mg Neb q6hprn Wheezing and SOB -Will need Ambulatory Home O2 Screen Prior to D/C -PT/OT recommending SNF  Community-acquired pneumonia  -Flu negative, RVP negative, novel CV [place]. Ur strep Ag negative -Procalcitonin improving last 24 hours -Completed 7-day course of antimicrobial therapy -Nebs As above -C/w Flutter valve and Incentive Spirometry   Viridans strep bacteremia.  -? Contaminant vs true pathogen.  -TTE 3/22 no evidence endocarditis.  3/24 BCx no growth, supports notion that prior BCx was contaminant  -Completed antimicrobials  AKI on CKD Stage 3 -Peak creatinine 2.78 on 3/20; Baseline 1.5; Labs(Brief)       Lab Results  Component Value Date   CREATININE 1.83 (H) 08/12/2018   CREATININE 1.74 (H) 08/11/2018   CREATININE 1.56 (H) 08/10/2018    -Would not let us repeat Labwork this A -Resumed home diuretic this AM -Continue to Monitor Cr if patient allows [place]  Type II DM.  -C/w Sensitive Novolog Sliding scale insulin protocol AC -CBG's ranging from 135-265 -Continue to Adjust Insulin as Necessary  Macrocytic Anemia -Vitamin B12 reassuring -Folate level 8.8 -Patient's Hb/Hct has been dropping for the last few days and went from 8.1/25.7 -> 7.9/24.4 -> 7.7/24.1 and would not allow [place] to get blood work on him today -Continue to Monitor for S/Sx of Bleeding -Repeat CBC in AM  Non-ST elevation MI -Troponin peak at 1.37 (3/22) -Echocardiogram reassuring -Likely in the setting of Demand Ischemia -S/p Heparin gtt -C/w ASA 81 mg po Daily, Atorvastatin 80 mg po Daily, Metoprolol Tartrate 25 mg po BID  HTN  -C/w Home Antihypertensives with  Amlodipine 5 mg po Daily, Metoprolol Tartrate 25 mg po BID, Isosorbide-Hydralazine 20-37.5 1 tab po TID and Torsemide 10 mg po Daily   Chronic Diastolic CHF -Resumed home diuretics and hypertensive -Strict I's/O's and Daily Weights -C/w Metoprolol Tartrate 25 mg po BID, Isosorbide-Hydralazine 20-37.5 1 tab po TID and Torsemide 10 mg po Daily  -Patient is +1.595 Liters since admission and Weight is down 11 lbs -Continue to Monitor Volume Status Carefully   Acute Encephalopathy -Related to sedation -Improved but patient was agitated and refusing all interventions threatening to leave AMA  Leukocytosis -Patient's WBC went from 14.1 -> 14.7 -> 16.2 and he did not allow [place] to repeat blood work on him this AM -s/p Abx Course -Continue to Monitor for S/Sx of Infection -Repeat CBC in AM   DVT prophylaxis: Heparin 5,000 units sq q8h Code Status: FULL CODE Family Communication: No family present at bedside  Disposition Plan: SNF if Agreeable but if declines HHPT  Consultants:   PCCM Transfer   SIGNIFICANT EVENTS 3/21 tr to ICU & intubated, failed bipap 3/22 heparin gtt for trop pos 3/22 bradycardia with propofol, Versed drip started 3/25 COVID-19 NEGATIVE, extubated   STUDIES:   Post intubation PCXR - bilateral infiltrates c/w ARDS Influenza A/B - neg TTE 3/22 >> low normal systolic function 23-76%, concentric LVH.  Unable to truly assess diastolic function, right heart function  CXR> LLL opacity   Antimicrobials:  Anti-infectives (From admission, onward)   Start     Dose/Rate Route Frequency Ordered Stop   08/09/18 1000  azithromycin (ZITHROMAX) 200 MG/5ML suspension 500 mg     500 mg Per Tube Daily 08/09/18 0405 08/11/18 0908   08/08/18 1630  piperacillin-tazobactam (ZOSYN) IVPB 3.375 g  Status:  Discontinued     3.375 g 12.5 mL/hr over 240 Minutes Intravenous Every 8 hours 08/08/18 1625 08/11/18 1148   08/08/18 1000  azithromycin (ZITHROMAX) 200 MG/5ML suspension 500 mg   Status:  Discontinued     500 mg Per Tube Daily 08/08/18 0407 08/09/18 0405   08/07/18 1330  azithromycin (ZITHROMAX) 200 MG/5ML suspension 500 mg  Status:  Discontinued     500 mg Per Tube Daily 08/07/18 1324 08/08/18 0407   08/07/18 1300  piperacillin-tazobactam (ZOSYN) IVPB 3.375 g  Status:  Discontinued     3.375 g 12.5 mL/hr over 240 Minutes Intravenous Every 8 hours 08/07/18 1240 08/07/18 1325   08/06/18 0600  vancomycin (VANCOCIN) 1,750 mg in sodium chloride 0.9 % 500 mL IVPB  Status:  Discontinued     1,750 mg 250 mL/hr over 120 Minutes Intravenous Every 24 hours 08/05/18 0434 08/05/18 1049   08/05/18 1700  ceFEPIme (MAXIPIME) 2 g in sodium chloride 0.9 % 100 mL IVPB  Status:  Discontinued     2 g 200 mL/hr over 30 Minutes Intravenous Every 12 hours 08/05/18 0434 08/05/18 1049   08/05/18 1230  azithromycin (ZITHROMAX) tablet 500 mg  Status:  Discontinued     500 mg Oral Every 24 hours 08/05/18 1049 08/07/18 1324   08/05/18 1200  cefTRIAXone (ROCEPHIN) 2 g in sodium chloride 0.9 % 100 mL IVPB  Status:  Discontinued     2 g 200 mL/hr over 30 Minutes Intravenous Every 24 hours  08/05/18 1049 08/07/18 1220   08/05/18 0415  ceFEPIme (MAXIPIME) 2 g in sodium chloride 0.9 % 100 mL IVPB     2 g 200 mL/hr over 30 Minutes Intravenous  Once 08/05/18 0404 08/05/18 0447   08/05/18 0415  metroNIDAZOLE (FLAGYL) IVPB 500 mg     500 mg 100 mL/hr over 60 Minutes Intravenous  Once 08/05/18 0404 08/05/18 0520   08/05/18 0415  vancomycin (VANCOCIN) IVPB 1000 mg/200 mL premix  Status:  Discontinued     1,000 mg 200 mL/hr over 60 Minutes Intravenous  Once 08/05/18 0404 08/05/18 0407   08/05/18 0415  vancomycin (VANCOCIN) 2,000 mg in sodium chloride 0.9 % 500 mL IVPB     2,000 mg 250 mL/hr over 120 Minutes Intravenous  Once 08/05/18 0407 08/05/18 0923     Subjective: Seen and examined at bedside a was very agitated as he " get any rest".  States that people kept bothering him and he has not slept in  3 days.  No nausea or vomiting.  Refused blood work this morning along with medications and other interventions.  Threatened to leave [place] later on the day but decided to stay after discussion with his wife.  PT evaluated and recommending SNF.  Agreeable to obtaining blood work late in the evening.  No other concerns or complaints at this time.  Objective: Vitals:   08/13/18 0048 08/13/18 0956 08/13/18 1213 08/13/18 1723  BP: (!) 148/71 (!) 148/69 (!) 132/113 (!) 163/91  Pulse: 73 92 83 83  Resp: 18  18 16   Temp: 98.9 F (37.2 C)  98.6 F (37 C) 98.9 F (37.2 C)  TempSrc: Oral  Oral Oral  SpO2: 95%  98% 96%  Weight: 94.8 kg     Height:        Intake/Output Summary (Last 24 hours) at 08/13/2018 1815 Last data filed at 08/13/2018 1800 Gross per 24 hour  Intake 1063 ml  Output 850 ml  Net 213 ml   Filed Weights   08/05/18 0336 08/12/18 0430 08/13/18 0048  Weight: 99.8 kg 95.9 kg 94.8 kg   Examination: Physical Exam:  Constitutional: WN/WD overweight AAM in NAD and appears agitated  Eyes: Lids and conjunctivae normal, sclerae anicteric  ENMT: External Ears, Nose appear normal. Grossly normal hearing.  Neck: Appears normal, supple, no cervical masses, normal ROM, no appreciable thyromegaly; no JVD Respiratory: Diminished to auscultation bilaterally, no wheezing, rales, rhonchi or crackles. Normal respiratory effort and patient is not tachypenic. No accessory muscle use. Unlabored Breathing  Cardiovascular: RRR, no murmurs / rubs / gallops. S1 and S2 auscultated. Trace extremity edema.  Abdomen: Soft, non-tender, Distended slightly 2/2 to body habiuts. No masses palpated. No appreciable hepatosplenomegaly. Bowel sounds positive.  GU: Deferred. Musculoskeletal: No clubbing / cyanosis of digits/nails. No joint deformity upper and lower extremities.  Skin: No rashes, lesions, ulcers on a limited skin evaluation. No induration; Warm and dry.  Neurologic: CN 2-12  grossly intact with no focal deficits.  Romberg sign and cerebellar reflexes not assessed.  Psychiatric: Normal judgment and insight. Alert and oriented x 3. Extremely agitated mood and appropriate affect.   Data Reviewed: I have personally reviewed following labs and imaging studies  CBC: Recent Labs  Lab 08/08/18 0255 08/09/18 0447 08/10/18 0629 08/11/18 0500 08/12/18 0239  WBC 13.9* 12.6* 14.1* 14.7* 16.2*  NEUTROABS 11.6* 10.1*  --   --   --   HGB 7.6* 8.2* 8.1* 7.9* 7.7*  HCT 24.0*  22.5* 25.5* 25.7* 24.4* 24.1*  MCV 107.6* 107.1* 107.1* 107.5* 107.6*  PLT 265 257 276 307 532   Basic Metabolic Panel: Recent Labs  Lab 08/08/18 0255 08/09/18 0447 08/10/18 0629 08/11/18 0500 08/12/18 0239  NA 141 141 141 141 142  K 3.3* 3.6 3.1* 3.7 3.5  CL 113* 112* 107 108 109  CO2 18* 19* 18* 21* 22  GLUCOSE 123* 144* 140* 212* 152*  BUN 27* 19 11 12 10   CREATININE 2.09* 1.73* 1.56* 1.74* 1.83*  CALCIUM 7.2* 8.1* 8.4* 8.3* 8.4*  MG  --  1.9 1.8 1.7  --   PHOS  --  3.1  --   --   --    GFR: Estimated Creatinine Clearance: 43 mL/min (A) (by C-G formula based on SCr of 1.83 mg/dL (H)). Liver Function Tests: Recent Labs  Lab 08/07/18 1105 08/08/18 0255  AST 38 31  ALT 27 19  ALKPHOS 277* 180*  BILITOT 1.4* 0.8  PROT 7.8 5.9*  ALBUMIN 2.6* 2.1*   No results for input(s): LIPASE, AMYLASE in the last 168 hours. No results for input(s): AMMONIA in the last 168 hours. Coagulation Profile: No results for input(s): INR, PROTIME in the last 168 hours. Cardiac Enzymes: Recent Labs  Lab 08/07/18 1330 08/07/18 2111 08/08/18 0255 08/08/18 0951 08/09/18 0447  TROPONINI 0.15* 0.70* 1.37* 1.15* 0.38*   BNP (last 3 results) No results for input(s): PROBNP in the last 8760 hours. HbA1C: No results for input(s): HGBA1C in the last 72 hours. CBG: Recent Labs  Lab 08/12/18 1350 08/12/18 1612 08/12/18 2118 08/13/18 1210 08/13/18 1721  GLUCAP 265* 219* 202* 185* 265*    Lipid Profile: No results for input(s): CHOL, HDL, LDLCALC, TRIG, CHOLHDL, LDLDIRECT in the last 72 hours. Thyroid Function Tests: No results for input(s): TSH, T4TOTAL, FREET4, T3FREE, THYROIDAB in the last 72 hours. Anemia Panel: No results for input(s): VITAMINB12, FOLATE, FERRITIN, TIBC, IRON, RETICCTPCT in the last 72 hours. Sepsis Labs: Recent Labs  Lab 08/07/18 1105 08/08/18 0255 08/09/18 0447  PROCALCITON 59.31 60.37 25.59    Recent Results (from the past 240 hour(s))  Blood Culture (routine x 2)     Status: Abnormal   Collection Time: 08/05/18  4:09 AM  Result Value Ref Range Status   Specimen Description BLOOD LEFT ANTECUBITAL  Final   Special Requests   Final    BOTTLES DRAWN AEROBIC AND ANAEROBIC Blood Culture adequate volume   Culture  Setup Time   Final    GRAM POSITIVE COCCI ANAEROBIC BOTTLE ONLY CRITICAL RESULT CALLED TO, READ BACK BY AND VERIFIED WITH: [place], M [phone] [id number] FCP    Culture (A)  Final    VIRIDANS STREPTOCOCCUS THE SIGNIFICANCE OF ISOLATING THIS ORGANISM FROM A SINGLE SET OF BLOOD CULTURES WHEN MULTIPLE SETS ARE DRAWN IS UNCERTAIN. PLEASE NOTIFY THE MICROBIOLOGY DEPARTMENT WITHIN ONE WEEK IF SPECIATION AND SENSITIVITIES ARE REQUIRED. Performed at [facility], [place] [address], [place], [place] [address]    Report Status 08/08/2018 FINAL  Final  Blood Culture (routine x 2)     Status: None   Collection Time: 08/05/18  4:09 AM  Result Value Ref Range Status   Specimen Description BLOOD RIGHT ARM  Final   Special Requests   Final    BOTTLES DRAWN AEROBIC AND ANAEROBIC Blood Culture adequate volume   Culture   Final    NO GROWTH 5 DAYS Performed at [facility], [place] [address], [place], [place] [address]  Report Status 08/10/2018 FINAL  Final  Blood Culture ID Panel (Reflexed)     Status: Abnormal   Collection Time: 08/05/18  4:09 AM  Result Value Ref Range Status   Enterococcus species NOT DETECTED NOT DETECTED Final    Listeria monocytogenes NOT DETECTED NOT DETECTED Final   Staphylococcus species NOT DETECTED NOT DETECTED Final   Staphylococcus aureus (BCID) NOT DETECTED NOT DETECTED Final   Streptococcus species DETECTED (A) NOT DETECTED Final    Comment: Not Enterococcus species, Streptococcus agalactiae, Streptococcus pyogenes, or Streptococcus pneumoniae. CRITICAL RESULT CALLED TO, READ BACK BY AND VERIFIED WITH: PHARMD TURNER, M [phone] [id number] FCP    Streptococcus agalactiae NOT DETECTED NOT DETECTED Final   Streptococcus pneumoniae NOT DETECTED NOT DETECTED Final   Streptococcus pyogenes NOT DETECTED NOT DETECTED Final   Acinetobacter baumannii NOT DETECTED NOT DETECTED Final   Enterobacteriaceae species NOT DETECTED NOT DETECTED Final   Enterobacter cloacae complex NOT DETECTED NOT DETECTED Final   Escherichia coli NOT DETECTED NOT DETECTED Final   Klebsiella oxytoca NOT DETECTED NOT DETECTED Final   Klebsiella pneumoniae NOT DETECTED NOT DETECTED Final   Proteus species NOT DETECTED NOT DETECTED Final   Serratia marcescens NOT DETECTED NOT DETECTED Final   Haemophilus influenzae NOT DETECTED NOT DETECTED Final   Neisseria meningitidis NOT DETECTED NOT DETECTED Final   Pseudomonas aeruginosa NOT DETECTED NOT DETECTED Final   Candida albicans NOT DETECTED NOT DETECTED Final   Candida glabrata NOT DETECTED NOT DETECTED Final   Candida krusei NOT DETECTED NOT DETECTED Final   Candida parapsilosis NOT DETECTED NOT DETECTED Final   Candida tropicalis NOT DETECTED NOT DETECTED Final    Comment: Performed at [facility], [place] [address] [place], [place] [address]  Urine culture     Status: Abnormal   Collection Time: 08/05/18  4:20 AM  Result Value Ref Range Status   Specimen Description URINE, RANDOM  Final   Special Requests NONE  Final   Culture (A)  Final    <10,000 COLONIES/mL INSIGNIFICANT GROWTH Performed at [facility], [place] [address] [place], [place] [address]    Report  Status 08/06/2018 FINAL  Final  Novel Coronavirus, NAA (hospital order; send-out to ref lab)     Status: None   Collection Time: 08/05/18  4:20 AM  Result Value Ref Range Status   SARS-CoV-2, NAA NOT DETECTED NOT DETECTED Final    Comment: (NOTE) This test was developed and its performance characteristics determined by [employer]. This test has not been FDA cleared or approved. This test has been authorized by FDA under an Emergency Use Authorization (EUA). This test has been validated in accordance with the FDA's Guidance Document (Policy for [place] in Laboratories Certified to Perform High Complexity Testing under CLIA prior to Emergency Use Authorization for Coronavirus [id number] during the [facility] Emergency) issued on February 29th, 2020. FDA independent review of this validation is pending. This test is only authorized for the duration of time the declaration that circumstances exist justifying the authorization of the emergency use of in vitro diagnostic tests for detection of SARS-CoV- 2 virus and/or diagnosis of COVID-19 infection under section 564(b)(1) of the Act, 21 U.S.C. 941DEY-8(X)(4), unless the authorization is terminated or revoked sooner. Performed At: [facility] [address] [place], [place] [id number] Rush Farmer MD HF:[id number]    Coronavirus Source NASAL SWAB  Final    Comment: Performed at [facility], [place] [address], [place], [place] [address]  MRSA PCR Screening     Status: None   Collection Time: 08/07/18 12:12 PM  Result Value Ref Range Status   MRSA by PCR NEGATIVE NEGATIVE Final    Comment:        The GeneXpert MRSA Assay (FDA approved for NASAL specimens only), is one component of a comprehensive MRSA colonization surveillance program. It is not intended to diagnose MRSA infection nor to guide or monitor treatment for MRSA infections. Performed at [facility], [place] [address]  [place], [place] [address]   Culture, respiratory (non-expectorated)     Status: None   Collection Time: 08/07/18  2:30 PM  Result Value Ref Range Status   Specimen Description TRACHEAL ASPIRATE  Final   Special Requests NONE  Final   Gram Stain   Final    MODERATE WBC PRESENT, PREDOMINANTLY PMN NO ORGANISMS SEEN    Culture   Final    FEW DIPHTHEROIDS(CORYNEBACTERIUM SPECIES) Standardized susceptibility testing for this organism is not available. Performed at [facility], [place] [address] [place], [place] [address]    Report Status 08/10/2018 FINAL  Final  Respiratory Panel by PCR     Status: None   Collection Time: 08/08/18  9:22 AM  Result Value Ref Range Status   Adenovirus NOT DETECTED NOT DETECTED Final   Coronavirus 229E NOT DETECTED NOT DETECTED Final    Comment: (NOTE) The Coronavirus on the Respiratory Panel, DOES NOT test for the novel  Coronavirus (2019 nCoV)    Coronavirus HKU1 NOT DETECTED NOT DETECTED Final   Coronavirus NL63 NOT DETECTED NOT DETECTED Final   Coronavirus OC43 NOT DETECTED NOT DETECTED Final   Metapneumovirus NOT DETECTED NOT DETECTED Final   Rhinovirus / Enterovirus NOT DETECTED NOT DETECTED Final   Influenza A NOT DETECTED NOT DETECTED Final   Influenza B NOT DETECTED NOT DETECTED Final   Parainfluenza Virus 1 NOT DETECTED NOT DETECTED Final   Parainfluenza Virus 2 NOT DETECTED NOT DETECTED Final   Parainfluenza Virus 3 NOT DETECTED NOT DETECTED Final   Parainfluenza Virus 4 NOT DETECTED NOT DETECTED Final   Respiratory Syncytial Virus NOT DETECTED NOT DETECTED Final   Bordetella pertussis NOT DETECTED NOT DETECTED Final   Chlamydophila pneumoniae NOT DETECTED NOT DETECTED Final   Mycoplasma pneumoniae NOT DETECTED NOT DETECTED Final    Comment: Performed at [facility], [address], [place], [place] [address]  Culture, blood (Routine X 2) w Reflex to ID Panel     Status: None (Preliminary result)   Collection Time: 08/10/18  1:31  PM  Result Value Ref Range Status   Specimen Description BLOOD BLOOD RIGHT HAND  Final   Special Requests   Final    BOTTLES DRAWN AEROBIC AND ANAEROBIC Blood Culture adequate volume   Culture   Final    NO GROWTH 3 DAYS Performed at [facility], [place] [address], [place], [place] [address]    Report Status PENDING  Incomplete  Culture, blood (Routine X 2) w Reflex to ID Panel     Status: None (Preliminary result)   Collection Time: 08/10/18  1:31 PM  Result Value Ref Range Status   Specimen Description BLOOD RIGHT ANTECUBITAL  Final   Special Requests   Final    BOTTLES DRAWN AEROBIC AND ANAEROBIC Blood Culture adequate volume   Culture   Final    NO GROWTH 3 DAYS Performed at [facility], [place] [address], [place], [place] [address]  Report Status PENDING  Incomplete    Estimated body mass index is 26.83 kg/m as calculated from the following:   Height as of this encounter: 6\' 2"  (1.88 m).   Weight as of this encounter: 94.8 kg.  Malnutrition Type:  Nutrition Problem: Increased nutrient needs Etiology: wound healing   Malnutrition Characteristics:  Signs/Symptoms: estimated needs   Nutrition Interventions:  Interventions: MVI, Refer to RD note for recommendations   Radiology Studies: Dg Wrist 2 Views Right  Result Date: 08/12/2018 CLINICAL DATA:  73 year old male with pain after blunt trauma. EXAM: RIGHT WRIST - 2 VIEW COMPARISON:  None. FINDINGS: Chondrocalcinosis. Severe joint space loss and subchondral sclerosis at the lunate-triquetrum articulation. Distal radius and ulna are intact. No carpal bone fracture identified. Visible metacarpals appear intact. No discrete soft tissue injury. IMPRESSION: Degenerative changes.  No acute osseous abnormality identified. Electronically Signed   By: Genevie Ann M.D.   On: 08/12/2018 00:32   Dg Chest Port 1 View  Result Date: 08/13/2018 CLINICAL DATA:  Pneumonia, respiratory failure and ARDS. EXAM: PORTABLE CHEST 1  VIEW COMPARISON:  08/12/2018 FINDINGS: Stable top-normal heart size. Low bilateral lung volumes. Mild residual interstitial prominence without overt edema, focal airspace consolidation, pleural fluid or pneumothorax. IMPRESSION: Low bilateral lung volumes.  Mild residual interstitial prominence. Electronically Signed   By: Aletta Edouard M.D.   On: 08/13/2018 08:38   Dg Chest Port 1 View  Result Date: 08/12/2018 CLINICAL DATA:  Pneumonia EXAM: PORTABLE CHEST 1 VIEW COMPARISON:  07/22/2018 FINDINGS: Cardiac enlargement. Left coronary stent. Endotracheal tube and NG tube removed. Improvement in bilateral airspace disease. Improved aeration of the lungs. Mild residual interstitial airspace disease. IMPRESSION: Interval improvement in bibasilar airspace disease. Endotracheal tube and NG tube removed. Electronically Signed   By: Franchot Gallo M.D.   On: 08/12/2018 08:15   Scheduled Meds:  amLODipine  5 mg Oral Daily   aspirin  81 mg Oral Daily   atorvastatin  80 mg Oral q1800   dicyclomine  10 mg Oral TID AC & HS   dorzolamide-timolol  1 drop Both Eyes BID   heparin injection (subcutaneous)  5,000 Units Subcutaneous Q8H   insulin aspart  0-9 Units Subcutaneous TID WC   isosorbide-hydrALAZINE  1 tablet Oral TID   mouth rinse  15 mL Mouth Rinse BID   metoprolol tartrate  25 mg Oral BID   multivitamin with minerals  1 tablet Oral Daily   Ensure Max Protein  11 oz Oral BID   sodium chloride flush  3 mL Intravenous Q12H   torsemide  10 mg Oral Daily   Continuous Infusions:   LOS: 8 days    Kerney Elbe, DO [facility] PAGER is on AMION  If 7PM-7AM, please contact night-coverage [email or web address] Password [facility] 08/13/2018, 6:15 PM

## 2018-08-14 ENCOUNTER — Inpatient Hospital Stay (HOSPITAL_COMMUNITY): Payer: Medicare Other

## 2018-08-14 DIAGNOSIS — M25431 Effusion, right wrist: Secondary | ICD-10-CM

## 2018-08-14 DIAGNOSIS — M25531 Pain in right wrist: Secondary | ICD-10-CM

## 2018-08-14 DIAGNOSIS — A491 Streptococcal infection, unspecified site: Secondary | ICD-10-CM

## 2018-08-14 LAB — CBC WITH DIFFERENTIAL/PLATELET
Abs Immature Granulocytes: 0.23 10*3/uL — ABNORMAL HIGH (ref 0.00–0.07)
BASOS ABS: 0 10*3/uL (ref 0.0–0.1)
Basophils Relative: 0 %
Eosinophils Absolute: 0 10*3/uL (ref 0.0–0.5)
Eosinophils Relative: 0 %
HCT: 24.5 % — ABNORMAL LOW (ref 39.0–52.0)
Hemoglobin: 7.6 g/dL — ABNORMAL LOW (ref 13.0–17.0)
Immature Granulocytes: 1 %
LYMPHS PCT: 14 %
Lymphs Abs: 2.3 10*3/uL (ref 0.7–4.0)
MCH: 33 pg (ref 26.0–34.0)
MCHC: 31 g/dL (ref 30.0–36.0)
MCV: 106.5 fL — ABNORMAL HIGH (ref 80.0–100.0)
Monocytes Absolute: 1.5 10*3/uL — ABNORMAL HIGH (ref 0.1–1.0)
Monocytes Relative: 9 %
Neutro Abs: 12.9 10*3/uL — ABNORMAL HIGH (ref 1.7–7.7)
Neutrophils Relative %: 76 %
Platelets: 304 10*3/uL (ref 150–400)
RBC: 2.3 MIL/uL — AB (ref 4.22–5.81)
RDW: 11.7 % (ref 11.5–15.5)
WBC: 17 10*3/uL — AB (ref 4.0–10.5)
nRBC: 0 % (ref 0.0–0.2)

## 2018-08-14 LAB — GLUCOSE, CAPILLARY
Glucose-Capillary: 268 mg/dL — ABNORMAL HIGH (ref 70–99)
Glucose-Capillary: 285 mg/dL — ABNORMAL HIGH (ref 70–99)
Glucose-Capillary: 292 mg/dL — ABNORMAL HIGH (ref 70–99)

## 2018-08-14 LAB — COMPREHENSIVE METABOLIC PANEL
ALBUMIN: 2.4 g/dL — AB (ref 3.5–5.0)
ALT: 48 U/L — ABNORMAL HIGH (ref 0–44)
ANION GAP: 12 (ref 5–15)
AST: 57 U/L — ABNORMAL HIGH (ref 15–41)
Alkaline Phosphatase: 187 U/L — ABNORMAL HIGH (ref 38–126)
BUN: 12 mg/dL (ref 8–23)
CALCIUM: 8.4 mg/dL — AB (ref 8.9–10.3)
CO2: 23 mmol/L (ref 22–32)
Chloride: 104 mmol/L (ref 98–111)
Creatinine, Ser: 1.74 mg/dL — ABNORMAL HIGH (ref 0.61–1.24)
GFR calc Af Amer: 45 mL/min — ABNORMAL LOW (ref >60)
GFR calc non Af Amer: 39 mL/min — ABNORMAL LOW (ref >60)
Glucose, Bld: 214 mg/dL — ABNORMAL HIGH (ref 70–99)
Potassium: 3.4 mmol/L — ABNORMAL LOW (ref 3.5–5.1)
SODIUM: 139 mmol/L (ref 135–145)
TOTAL PROTEIN: 7.6 g/dL (ref 6.5–8.1)
Total Bilirubin: 1.1 mg/dL (ref 0.3–1.2)

## 2018-08-14 LAB — MAGNESIUM: Magnesium: 1.8 mg/dL (ref 1.7–2.4)

## 2018-08-14 LAB — PHOSPHORUS: Phosphorus: 3.6 mg/dL (ref 2.5–4.6)

## 2018-08-14 MED ORDER — PREDNISONE 20 MG PO TABS
40.0000 mg | ORAL_TABLET | Freq: Every day | ORAL | Status: DC
  Administered 2018-08-14 – 2018-08-15 (×2): 40 mg via ORAL
  Filled 2018-08-14 (×3): qty 2

## 2018-08-14 MED ORDER — SODIUM CHLORIDE 0.9 % IV SOLN
INTRAVENOUS | Status: DC | PRN
  Administered 2018-08-14: 250 mL via INTRAVENOUS

## 2018-08-14 MED ORDER — POTASSIUM CHLORIDE CRYS ER 20 MEQ PO TBCR
40.0000 meq | EXTENDED_RELEASE_TABLET | Freq: Once | ORAL | Status: AC
  Administered 2018-08-14: 40 meq via ORAL
  Filled 2018-08-14: qty 2

## 2018-08-14 MED ORDER — GUAIFENESIN ER 600 MG PO TB12
1200.0000 mg | ORAL_TABLET | Freq: Two times a day (BID) | ORAL | Status: DC
  Administered 2018-08-14 – 2018-08-15 (×3): 1200 mg via ORAL
  Filled 2018-08-14 (×3): qty 2

## 2018-08-14 MED ORDER — LEVALBUTEROL HCL 0.63 MG/3ML IN NEBU
0.6300 mg | INHALATION_SOLUTION | Freq: Four times a day (QID) | RESPIRATORY_TRACT | Status: DC
  Administered 2018-08-14: 0.63 mg via RESPIRATORY_TRACT
  Filled 2018-08-14 (×3): qty 3

## 2018-08-14 MED ORDER — SODIUM CHLORIDE 0.9 % IV SOLN
2.0000 g | INTRAVENOUS | Status: DC
  Administered 2018-08-14: 2 g via INTRAVENOUS
  Filled 2018-08-14 (×2): qty 20

## 2018-08-14 MED ORDER — IPRATROPIUM BROMIDE 0.02 % IN SOLN
0.5000 mg | Freq: Four times a day (QID) | RESPIRATORY_TRACT | Status: DC
  Administered 2018-08-14: 0.5 mg via RESPIRATORY_TRACT
  Filled 2018-08-14 (×3): qty 2.5

## 2018-08-14 NOTE — Progress Notes (Signed)
PT Cancellation Note  Patient Details Name: David [name] MRN: [id number] DOB: [birth year]   Cancelled Treatment:      Patient declined despite cuing and education. He reports he will participate on 08/15/2018.    Carney Living 08/14/2018, 3:30 PM

## 2018-08-14 NOTE — Progress Notes (Signed)
Patient is refusing lab work and wants to go home explained the importance of each test patient continues to have fluctuating behavior.

## 2018-08-14 NOTE — Progress Notes (Signed)
Pt has refused morning blood sugar. Will continue to monitor pt

## 2018-08-14 NOTE — Progress Notes (Signed)
RT Note: Took patient the flutter that was ordered, explained how to use it, and what it is used for.  Asked patient to demonstrate to see patient perform, patient refused and asked me to lay it near the sink.  Patient had refused medications earlier when I came by to do them.

## 2018-08-14 NOTE — Progress Notes (Signed)
PROGRESS NOTE    KEE DRUDGE  FIE:[id number] DOB: 1945/09/16 DOA: 08/05/2018 PCP: [name], MD   Brief Narrative:  The patient is ana 73y. o B M with a PMH CAD s/p stent placement, diastolic CHF, pulm HTN, DM type 2, CKD III along with other comorbidites who presented to the ED 3/19 with with fever up to 104.9, generalized weakness, chills and a dry cough within the 24 hours pta. He denied headache, chest pain, abdominal pain, cough, shortness of breath, vomiting, diarrhea, or rash. However, the COVID screen did report a dry cough within the 12 hours pta. There were no known sick contacts or recent travel; did have contact with individuals traveling from [place]. Did have a flu vaccination this year.  In the ED he was treated for presumptive SIRS and initated on Rocephin, Zithromax and vanc 2 g x 1. Procalcitonin was 1.28 on adm and had risen to 59.31 (3/21). WBC had risen from 15.4 on admission to 31.9 . Initial PCXR was somewhat underinflated,with interstitial  infiltrates which worsened on subsequent films. He was Intubated for ARDS on 08/07/2018 and then treated and extubated on 3/25. COVID-19 SARS Co-V2 was Negative. He was Transitioned to [facility] on 08/13/2018 and has been refusing all interventions and threatening to leave AMA. Complaining of Right Arm/Wrist Pain so CT of Wrist was ordered. Leukocytosis is worsening currently.   Assessment & Plan:   Principal Problem:   Multifocal pneumonia Active Problems:   Insulin-requiring or dependent type II diabetes mellitus ([place])   Acute on chronic respiratory failure with hypoxia (HCC)   Chronic heart failure with preserved ejection fraction (HFpEF) (HCC)   Bilateral carotid artery stenosis   Coronary artery disease involving native coronary artery of native heart without angina pectoris   AKI (acute kidney injury) ([place])  ARDS from Multifocal PNA in the setting of Strep Viridans Bacteremia  -Suspected due to CAP, consider ALI  due to S. Viridans bacteremia -COVID-19 negative  -Had Pulmonary vascular congestion on CXR; Repeat CXR this AM showed "Stable top-normal heart size. Low bilateral lung volumes. Mild residual interstitial prominence without overt edema, focal airspace consolidation, pleural fluid or pneumothorax." -Extubated 08/11/2018 -Continue diuresis and is now on Torsemide -Transferred to [place] on 08/13/2018 -C/w Albuterol 2.5 mg Neb q6hprn Wheezing and SOB and added leave albuterol/Ipratropium 6 Also added guaifenesin 12 mg p.o. twice daily -Will need Ambulatory Home O2 Screen Prior to D/C -PT/OT recommending SNF  Community-acquired pneumonia  -Flu negative, RVP negative, novel CV [place]. Ur strep Ag negative -Procalcitonin improving last 24 hours -Completed 7-day course of antimicrobial therapy -Nebs As above -C/w Flutter valve and Incentive Spirometry  -Repeat CXR this AM showed Mild chronic interstitial markings, favoring chronic interstitial lung disease.Mild bibasilar opacities, possibly atelectasis -Will also add Flutter Valve and Incentive Spirometry   Viridans strep bacteremia.  -Originally thought to be Contaminant (1/2 Bottles) vs true pathogen but it is a True Pathogen per ID  -TTE 3/22 no evidence endocarditis.  -3/24 BCx no growth at 4 days -Completed antimicrobials but restarted IV Ceftriaxone at the recommendation of ID for 5 More days of Treatment  -Complaining of Right Wrist Pain so will have Orthopedics Tap wrist and send for analysis  -Continue to Monitor and repeat CBC and Procalcitonin Level in AM   AKI on CKD Stage 3 -Peak creatinine 2.78 on 3/20; Baseline 1.5; -BUN/Cr has now improved and went to 12/1.74 -Resumed home diuretic  -Continue to Monitor Cr if patient allows [place]  Type II DM.  -C/w Sensitive Novolog Sliding scale insulin protocol AC -CBG's ranging from 135-265 -Continue to Adjust Insulin as Necessary  Macrocytic Anemia -Vitamin B12  reassuring -Folate level 8.8 -Patient's Hb/Hct has been dropping for the last few days and went from 8.1/25.7 -> 7.9/24.4 -> 7.7/24.1 -> 7.6/24.5  -Continue to Monitor for S/Sx of Bleeding -Repeat CBC in AM   Non-ST elevation MI -Troponin peak at 1.37 (3/22) -Echocardiogram reassuring -Likely in the setting of Demand Ischemia -S/p Heparin gtt -C/w ASA 81 mg po Daily, Atorvastatin 80 mg po Daily, Metoprolol Tartrate 25 mg po BID  HTN  -C/w Home Antihypertensives with Amlodipine 5 mg po Daily, Metoprolol Tartrate 25 mg po BID, Isosorbide-Hydralazine 20-37.5 1 tab po TID and Torsemide 10 mg po Daily  -BP is now elevated today and is 932/67  Chronic Diastolic CHF -Resumed home diuretics and hypertensive -Strict I's/O's and Daily Weights -C/w Metoprolol Tartrate 25 mg po BID, Isosorbide-Hydralazine 20-37.5 1 tab po TID and Torsemide 10 mg po Daily  -Patient is +1.278 Liters since admission and Weight is down 11 lbs and not done today  -Continue to Monitor Volume Status Carefully   Acute Encephalopathy -Related to sedation vs. Active Infection  -Improved but patient was agitated and refusing all interventions threatening to leave AMA yesterday. Calmer today   Leukocytosis, worsening  -Patient's WBC was improving but started to worsen an  went from 12.6 -> 14.1 -> 14.7 -> 16.2 -> 17.0  -s/p Abx Course but resumed Ceftriaxone 2 grams q24h for Bacteremia  -Repeat Blood Cx from 08/10/2018 showed NGTD at 4 Days -Repeat Urinalysis and Urine Cx -Repeat Procalcitonin Level  -Continue to Monitor for S/Sx of Infection -If continues to worsen will call ID formally  -Repeat CBC in AM   Right Wrist Pain -X-Ray showed Chondrocalcinosis. Severe joint space loss and subchondral sclerosis at the lunate-triquetrum articulation. Distal radius and ulna are intact. No carpal bone fracture identified. Visible metacarpals appear intact. No discrete soft tissue injury -CT of Wrist showed Chondrocalcinosis  with some erosions, possibly CPPD arthropathy. No appreciable fracture. -Will start Abx for ? Seeding from Streptococcus Viridans  -Will consult Orthopedics for evaluation and will have them Tap wrist -Will also start the patient on Prednisone  HypoKalemia -Patient's potassium this morning was 3.4 -Replete with p.o. potassium chloride 40 mEq -Continue monitor replete as necessary -Repeat CMP in a.m.  Abnormal LFTs -LFTs are starting to slightly elevate -AST is now 57 and ALT is 48 -Obtain acute hepatitis panel and right upper quadrant ultrasound -Right upper quadrant ultrasound showed No definite abnormality seen in the right upper quadrant of the abdomen -To need to monitor and trend hepatic function -Repeat CMP in the a.m.   DVT prophylaxis: Heparin 5,000 units sq q8h Code Status: FULL CODE Family Communication: No family present at bedside  Disposition Plan: SNF if Agreeable but if declines HHPT  Consultants:   PCCM Transfer   -Discussed with ID Physician Dr. Johnnye Sima via Telephone Conversation  Orthopedics Dr. Erlinda Hong  SIGNIFICANT EVENTS 3/21 tr to ICU & intubated, failed bipap 3/22 heparin gtt for trop pos 3/22 bradycardia with propofol, Versed drip started 3/25 COVID-19 NEGATIVE, extubated   STUDIES:   Post intubation PCXR - bilateral infiltrates c/w ARDS Influenza A/B - neg TTE 3/22 >> low normal systolic function 12-45%, concentric LVH.  Unable to truly assess diastolic function, right heart function  CXR> LLL opacity   Antimicrobials:  Anti-infectives (From admission, onward)   Start  Dose/Rate Route Frequency Ordered Stop   08/09/18 1000  azithromycin (ZITHROMAX) 200 MG/5ML suspension 500 mg     500 mg Per Tube Daily 08/09/18 0405 08/11/18 0908   08/08/18 1630  piperacillin-tazobactam (ZOSYN) IVPB 3.375 g  Status:  Discontinued     3.375 g 12.5 mL/hr over 240 Minutes Intravenous Every 8 hours 08/08/18 1625 08/11/18 1148   08/08/18 1000  azithromycin  (ZITHROMAX) 200 MG/5ML suspension 500 mg  Status:  Discontinued     500 mg Per Tube Daily 08/08/18 0407 08/09/18 0405   08/07/18 1330  azithromycin (ZITHROMAX) 200 MG/5ML suspension 500 mg  Status:  Discontinued     500 mg Per Tube Daily 08/07/18 1324 08/08/18 0407   08/07/18 1300  piperacillin-tazobactam (ZOSYN) IVPB 3.375 g  Status:  Discontinued     3.375 g 12.5 mL/hr over 240 Minutes Intravenous Every 8 hours 08/07/18 1240 08/07/18 1325   08/06/18 0600  vancomycin (VANCOCIN) 1,750 mg in sodium chloride 0.9 % 500 mL IVPB  Status:  Discontinued     1,750 mg 250 mL/hr over 120 Minutes Intravenous Every 24 hours 08/05/18 0434 08/05/18 1049   08/05/18 1700  ceFEPIme (MAXIPIME) 2 g in sodium chloride 0.9 % 100 mL IVPB  Status:  Discontinued     2 g 200 mL/hr over 30 Minutes Intravenous Every 12 hours 08/05/18 0434 08/05/18 1049   08/05/18 1230  azithromycin (ZITHROMAX) tablet 500 mg  Status:  Discontinued     500 mg Oral Every 24 hours 08/05/18 1049 08/07/18 1324   08/05/18 1200  cefTRIAXone (ROCEPHIN) 2 g in sodium chloride 0.9 % 100 mL IVPB  Status:  Discontinued     2 g 200 mL/hr over 30 Minutes Intravenous Every 24 hours 08/05/18 1049 08/07/18 1220   08/05/18 0415  ceFEPIme (MAXIPIME) 2 g in sodium chloride 0.9 % 100 mL IVPB     2 g 200 mL/hr over 30 Minutes Intravenous  Once 08/05/18 0404 08/05/18 0447   08/05/18 0415  metroNIDAZOLE (FLAGYL) IVPB 500 mg     500 mg 100 mL/hr over 60 Minutes Intravenous  Once 08/05/18 0404 08/05/18 0520   08/05/18 0415  vancomycin (VANCOCIN) IVPB 1000 mg/200 mL premix  Status:  Discontinued     1,000 mg 200 mL/hr over 60 Minutes Intravenous  Once 08/05/18 0404 08/05/18 0407   08/05/18 0415  vancomycin (VANCOCIN) 2,000 mg in sodium chloride 0.9 % 500 mL IVPB     2,000 mg 250 mL/hr over 120 Minutes Intravenous  Once 08/05/18 0407 08/05/18 3734     Subjective: Seen and examined at bedside and was wanting to go home but understood that he needs to get  better.  Complaining of right wrist pain and some shortness of breath this morning.  Felt as if he was wheezing and needed oxygen.  No nausea or vomiting.  No lightheadedness or dizziness.  No other concerns or complaints at this time and right wrist is wrapped and patient does not want to move it because of significant pain..  Objective: Vitals:   08/13/18 2015 08/13/18 2132 08/14/18 0033 08/14/18 1000  BP: (!) 155/74 (!) 160/77 (!) 142/76 (!) 161/77  Pulse: 86 86 77 94  Resp: 18  16 18   Temp:   99.2 F (37.3 C)   TempSrc:   Oral   SpO2:   95% 98%  Weight:      Height:        Intake/Output Summary (Last 24 hours) at 08/14/2018 1358 Last data  filed at 08/14/2018 0425 Gross per 24 hour  Intake 483 ml  Output 800 ml  Net -317 ml   Filed Weights   08/05/18 0336 08/12/18 0430 08/13/18 0048  Weight: 99.8 kg 95.9 kg 94.8 kg   Examination: Physical Exam:  Constitutional: Alert, well-developed overweight African-American male currently no acute distress appears calmer today and still wanting to go home. Eyes: Lids and conjunctive are normal.  Sclera anicteric ENMT: External ears nose appear normal.  Grossly normal hearing Neck: Appears supple no JVD Respiratory: Diminished auscultation bilaterally with no mild expiratory wheezing, rales, rhonchi but does have some coarse breath sounds.  He is wearing 1 L supplemental oxygen via nasal cannula but per nurse was not hypoxic.  Has unlabored breathing. Cardiovascular: Regular rate and rhythm.  No appreciable murmurs, rubs, gallops.  Mild lower extremity edema Abdomen: Soft, nontender, distended slightly secondary body habitus.  Bowel sounds present GU: Deferred Musculoskeletal: Has right toe amputations but no other real appreciable joint deformities on the limited evaluation.  Right wrist and forearm is wrapped in an Ace bandage Skin: No appreciable rashes or lesions on to skin evaluation and right wrist is wrapped Neurologic: Cranial  nerves II through XII grossly intact no appreciable focal deficits.  Romberg sign is cerebellar reflexes were not assessed. Psychiatric: Pleasant mood and affect today he was not agitated at all.  He is awake and alert  Data Reviewed: I have personally reviewed following labs and imaging studies  CBC: Recent Labs  Lab 08/08/18 0255 08/09/18 0447 08/10/18 0629 08/11/18 0500 08/12/18 0239 08/14/18 0350  WBC 13.9* 12.6* 14.1* 14.7* 16.2* 17.0*  NEUTROABS 11.6* 10.1*  --   --   --  12.9*  HGB 7.6* 8.2* 8.1* 7.9* 7.7* 7.6*  HCT 24.0*   22.5* 25.5* 25.7* 24.4* 24.1* 24.5*  MCV 107.6* 107.1* 107.1* 107.5* 107.6* 106.5*  PLT 265 257 276 307 285 182   Basic Metabolic Panel: Recent Labs  Lab 08/09/18 0447 08/10/18 0629 08/11/18 0500 08/12/18 0239 08/14/18 0350  NA 141 141 141 142 139  K 3.6 3.1* 3.7 3.5 3.4*  CL 112* 107 108 109 104  CO2 19* 18* 21* 22 23  GLUCOSE 144* 140* 212* 152* 214*  BUN 19 11 12 10 12   CREATININE 1.73* 1.56* 1.74* 1.83* 1.74*  CALCIUM 8.1* 8.4* 8.3* 8.4* 8.4*  MG 1.9 1.8 1.7  --  1.8  PHOS 3.1  --   --   --  3.6   GFR: Estimated Creatinine Clearance: 45.3 mL/min (A) (by C-G formula based on SCr of 1.74 mg/dL (H)). Liver Function Tests: Recent Labs  Lab 08/08/18 0255 08/14/18 0350  AST 31 57*  ALT 19 48*  ALKPHOS 180* 187*  BILITOT 0.8 1.1  PROT 5.9* 7.6  ALBUMIN 2.1* 2.4*   No results for input(s): LIPASE, AMYLASE in the last 168 hours. No results for input(s): AMMONIA in the last 168 hours. Coagulation Profile: No results for input(s): INR, PROTIME in the last 168 hours. Cardiac Enzymes: Recent Labs  Lab 08/07/18 2111 08/08/18 0255 08/08/18 0951 08/09/18 0447  TROPONINI 0.70* 1.37* 1.15* 0.38*   BNP (last 3 results) No results for input(s): PROBNP in the last 8760 hours. HbA1C: No results for input(s): HGBA1C in the last 72 hours. CBG: Recent Labs  Lab 08/12/18 2118 08/13/18 1210 08/13/18 1721 08/13/18 2123 08/14/18 1139   GLUCAP 202* 185* 265* 201* 268*   Lipid Profile: No results for input(s): CHOL, HDL, LDLCALC, TRIG, CHOLHDL, LDLDIRECT in the  last 72 hours. Thyroid Function Tests: No results for input(s): TSH, T4TOTAL, FREET4, T3FREE, THYROIDAB in the last 72 hours. Anemia Panel: No results for input(s): VITAMINB12, FOLATE, FERRITIN, TIBC, IRON, RETICCTPCT in the last 72 hours. Sepsis Labs: Recent Labs  Lab 08/08/18 0255 08/09/18 0447  PROCALCITON 60.37 25.59    Recent Results (from the past 240 hour(s))  Blood Culture (routine x 2)     Status: Abnormal   Collection Time: 08/05/18  4:09 AM  Result Value Ref Range Status   Specimen Description BLOOD LEFT ANTECUBITAL  Final   Special Requests   Final    BOTTLES DRAWN AEROBIC AND ANAEROBIC Blood Culture adequate volume   Culture  Setup Time   Final    GRAM POSITIVE COCCI ANAEROBIC BOTTLE ONLY CRITICAL RESULT CALLED TO, READ BACK BY AND VERIFIED WITH: Roxie, [phone] [phone] [id number] FCP    Culture (A)  Final    VIRIDANS STREPTOCOCCUS THE SIGNIFICANCE OF ISOLATING THIS ORGANISM FROM A SINGLE SET OF BLOOD CULTURES WHEN MULTIPLE SETS ARE DRAWN IS UNCERTAIN. PLEASE NOTIFY THE MICROBIOLOGY DEPARTMENT WITHIN ONE WEEK IF SPECIATION AND SENSITIVITIES ARE REQUIRED. Performed at [facility], [place] [address] [place], [place] [address]    Report Status 08/08/2018 FINAL  Final  Blood Culture (routine x 2)     Status: None   Collection Time: 08/05/18  4:09 AM  Result Value Ref Range Status   Specimen Description BLOOD RIGHT ARM  Final   Special Requests   Final    BOTTLES DRAWN AEROBIC AND ANAEROBIC Blood Culture adequate volume   Culture   Final    NO GROWTH 5 DAYS Performed at [facility], [place] [address], [place], [place] [address]    Report Status 08/10/2018 FINAL  Final  Blood Culture ID Panel (Reflexed)     Status: Abnormal   Collection Time: 08/05/18  4:09 AM  Result Value Ref Range Status   Enterococcus species NOT DETECTED NOT  DETECTED Final   Listeria monocytogenes NOT DETECTED NOT DETECTED Final   Staphylococcus species NOT DETECTED NOT DETECTED Final   Staphylococcus aureus (BCID) NOT DETECTED NOT DETECTED Final   Streptococcus species DETECTED (A) NOT DETECTED Final    Comment: Not Enterococcus species, Streptococcus agalactiae, Streptococcus pyogenes, or Streptococcus pneumoniae. CRITICAL RESULT CALLED TO, READ BACK BY AND VERIFIED WITH: PHARMD TURNER, M [phone] [id number] FCP    Streptococcus agalactiae NOT DETECTED NOT DETECTED Final   Streptococcus pneumoniae NOT DETECTED NOT DETECTED Final   Streptococcus pyogenes NOT DETECTED NOT DETECTED Final   Acinetobacter baumannii NOT DETECTED NOT DETECTED Final   Enterobacteriaceae species NOT DETECTED NOT DETECTED Final   Enterobacter cloacae complex NOT DETECTED NOT DETECTED Final   Escherichia coli NOT DETECTED NOT DETECTED Final   Klebsiella oxytoca NOT DETECTED NOT DETECTED Final   Klebsiella pneumoniae NOT DETECTED NOT DETECTED Final   Proteus species NOT DETECTED NOT DETECTED Final   Serratia marcescens NOT DETECTED NOT DETECTED Final   Haemophilus influenzae NOT DETECTED NOT DETECTED Final   Neisseria meningitidis NOT DETECTED NOT DETECTED Final   Pseudomonas aeruginosa NOT DETECTED NOT DETECTED Final   Candida albicans NOT DETECTED NOT DETECTED Final   Candida glabrata NOT DETECTED NOT DETECTED Final   Candida krusei NOT DETECTED NOT DETECTED Final   Candida parapsilosis NOT DETECTED NOT DETECTED Final   Candida tropicalis NOT DETECTED NOT DETECTED Final    Comment: Performed at [facility], [place] [address] [place], [place] [address]  Urine culture     Status: Abnormal   Collection Time: 08/05/18  4:20 AM  Result Value Ref Range Status   Specimen Description URINE, RANDOM  Final   Special Requests NONE  Final   Culture (A)  Final    <10,000 COLONIES/mL INSIGNIFICANT GROWTH Performed at [facility], [address], [place], [place]  [address]    Report Status 08/06/2018 FINAL  Final  Novel Coronavirus, NAA (hospital order; send-out to ref lab)     Status: None   Collection Time: 08/05/18  4:20 AM  Result Value Ref Range Status   SARS-CoV-2, NAA NOT DETECTED NOT DETECTED Final    Comment: (NOTE) This test was developed and its performance characteristics determined by [employer]. This test has not been FDA cleared or approved. This test has been authorized by FDA under an Emergency Use Authorization (EUA). This test has been validated in accordance with the FDA's Guidance Document (Policy for [place] in Laboratories Certified to Perform High Complexity Testing under CLIA prior to Emergency Use Authorization for Coronavirus [id number] during the [facility] Emergency) issued on February 29th, 2020. FDA independent review of this validation is pending. This test is only authorized for the duration of time the declaration that circumstances exist justifying the authorization of the emergency use of in vitro diagnostic tests for detection of SARS-CoV- 2 virus and/or diagnosis of COVID-19 infection under section 564(b)(1) of the Act, 21 U.S.C. 229NLG-9(Q)(1), unless the authorization is terminated or revoked sooner. Performed At: [facility] [address] [place], [place], [place] [id number] Rush Farmer MD KG:[id number]    Coronavirus Source NASAL SWAB  Final    Comment: Performed at [facility], [place] [address], [place], [place] [address]  MRSA PCR Screening     Status: None   Collection Time: 08/07/18 12:12 PM  Result Value Ref Range Status   MRSA by PCR NEGATIVE NEGATIVE Final    Comment:        The GeneXpert MRSA Assay (FDA approved for NASAL specimens only), is one component of a comprehensive MRSA colonization surveillance program. It is not intended to diagnose MRSA infection nor to guide or monitor treatment for MRSA infections. Performed at [facility],  [place]  [address], [place], [place] [address]   Culture, respiratory (non-expectorated)     Status: None   Collection Time: 08/07/18  2:30 PM  Result Value Ref Range Status   Specimen Description TRACHEAL ASPIRATE  Final   Special Requests NONE  Final   Gram Stain   Final    MODERATE WBC PRESENT, PREDOMINANTLY PMN NO ORGANISMS SEEN    Culture   Final    FEW DIPHTHEROIDS(CORYNEBACTERIUM SPECIES) Standardized susceptibility testing for this organism is not available. Performed at [facility], [place] [address], [place], [place] [address]    Report Status 08/10/2018 FINAL  Final  Respiratory Panel by PCR     Status: None   Collection Time: 08/08/18  9:22 AM  Result Value Ref Range Status   Adenovirus NOT DETECTED NOT DETECTED Final   Coronavirus 229E NOT DETECTED NOT DETECTED Final    Comment: (NOTE) The Coronavirus on the Respiratory Panel, DOES NOT test for the novel  Coronavirus (2019 nCoV)    Coronavirus HKU1 NOT DETECTED NOT DETECTED Final   Coronavirus NL63 NOT DETECTED NOT DETECTED Final   Coronavirus OC43 NOT DETECTED NOT DETECTED Final   Metapneumovirus NOT DETECTED NOT DETECTED Final   Rhinovirus / Enterovirus NOT  DETECTED NOT DETECTED Final   Influenza A NOT DETECTED NOT DETECTED Final   Influenza B NOT DETECTED NOT DETECTED Final   Parainfluenza Virus 1 NOT DETECTED NOT DETECTED Final   Parainfluenza Virus 2 NOT DETECTED NOT DETECTED Final   Parainfluenza Virus 3 NOT DETECTED NOT DETECTED Final   Parainfluenza Virus 4 NOT DETECTED NOT DETECTED Final   Respiratory Syncytial Virus NOT DETECTED NOT DETECTED Final   Bordetella pertussis NOT DETECTED NOT DETECTED Final   Chlamydophila pneumoniae NOT DETECTED NOT DETECTED Final   Mycoplasma pneumoniae NOT DETECTED NOT DETECTED Final    Comment: Performed at [facility], [place] [address], [place], [place] [address]  Culture, blood (Routine X 2) w Reflex to ID Panel     Status: None (Preliminary result)   Collection Time:  08/10/18  1:31 PM  Result Value Ref Range Status   Specimen Description BLOOD BLOOD RIGHT HAND  Final   Special Requests   Final    BOTTLES DRAWN AEROBIC AND ANAEROBIC Blood Culture adequate volume   Culture   Final    NO GROWTH 4 DAYS Performed at [facility], [place] [address], [place], [place] [address]    Report Status PENDING  Incomplete  Culture, blood (Routine X 2) w Reflex to ID Panel     Status: None (Preliminary result)   Collection Time: 08/10/18  1:31 PM  Result Value Ref Range Status   Specimen Description BLOOD RIGHT ANTECUBITAL  Final   Special Requests   Final    BOTTLES DRAWN AEROBIC AND ANAEROBIC Blood Culture adequate volume   Culture   Final    NO GROWTH 4 DAYS Performed at [facility], [place] [address] [place], [place] [address]    Report Status PENDING  Incomplete    Estimated body mass index is 26.83 kg/m as calculated from the following:   Height as of this encounter: 6\' 2"  (1.88 m).   Weight as of this encounter: 94.8 kg.  Malnutrition Type:  Nutrition Problem: Increased nutrient needs Etiology: wound healing   Malnutrition Characteristics:  Signs/Symptoms: estimated needs   Nutrition Interventions:  Interventions: MVI, Refer to RD note for recommendations   Radiology Studies: Ct Wrist Right Wo Contrast  Result Date: 08/14/2018 CLINICAL DATA:  Wrist injury and wrist pain. EXAM: CT OF THE RIGHT WRIST WITHOUT CONTRAST TECHNIQUE: Multidetector CT imaging of the right wrist was performed according to the standard protocol. Multiplanar CT image reconstructions were also generated. COMPARISON:  08/12/2018 FINDINGS: Bones/Joint/Cartilage Chondrocalcinosis observed notable the margins of the radiocarpal joint and along the scapholunate ligament region. There is chondrocalcinosis of the TFCC disc. Prominent loss of articular space the lunatotriquetral articulation. There is also degenerative loss of articular space in various portions the carpus,  for example between the trapezium and trapezoid. Suspected erosion along the dorsal lunate on image 180/208. No fracture is identified. Ligaments Suboptimally assessed by CT. Muscles and Tendons Grossly unremarkable Soft tissues Grossly unremarkable IMPRESSION: 1. Chondrocalcinosis with some erosions, possibly CPPD arthropathy. 2. No appreciable fracture. Electronically Signed   By: Van Clines M.D.   On: 08/14/2018 11:36   Dg Chest Port 1 View  Result Date: 08/14/2018 CLINICAL DATA:  Shortness of breath EXAM: PORTABLE CHEST 1 VIEW COMPARISON:  08/13/2018 FINDINGS: Mild increased interstitial markings, chronic. Mild bibasilar opacities. No pleural effusion or pneumothorax. Cardiomegaly. IMPRESSION: Mild chronic interstitial markings, favoring chronic interstitial lung disease. Mild bibasilar opacities, possibly atelectasis. Electronically Signed   By: Julian Hy  M.D.   On: 08/14/2018 07:37   Dg Chest Port 1 View  Result Date: 08/13/2018 CLINICAL DATA:  Pneumonia, respiratory failure and ARDS. EXAM: PORTABLE CHEST 1 VIEW COMPARISON:  08/12/2018 FINDINGS: Stable top-normal heart size. Low bilateral lung volumes. Mild residual interstitial prominence without overt edema, focal airspace consolidation, pleural fluid or pneumothorax. IMPRESSION: Low bilateral lung volumes.  Mild residual interstitial prominence. Electronically Signed   By: Aletta Edouard M.D.   On: 08/13/2018 08:38   US Abdomen Limited Ruq  Result Date: 08/14/2018 CLINICAL DATA:  Abnormal liver function tests. EXAM: ULTRASOUND ABDOMEN LIMITED RIGHT UPPER QUADRANT COMPARISON:  CT scan of December 03, 2017. FINDINGS: Gallbladder: No gallstones or wall thickening visualized. No sonographic Murphy sign noted by sonographer. Common bile duct: Diameter: 4 mm which is within normal limits. Liver: No focal lesion identified. Within normal limits in parenchymal echogenicity. Portal vein is patent on color Doppler imaging with normal  direction of blood flow towards the liver. IMPRESSION: No definite abnormality seen in the right upper quadrant of the abdomen. Electronically Signed   By: Marijo Conception, M.D.   On: 08/14/2018 09:46   Scheduled Meds:  amLODipine  5 mg Oral Daily   aspirin  81 mg Oral Daily   atorvastatin  80 mg Oral q1800   dicyclomine  10 mg Oral TID AC & HS   dorzolamide-timolol  1 drop Both Eyes BID   guaiFENesin  1,200 mg Oral BID   heparin injection (subcutaneous)  5,000 Units Subcutaneous Q8H   insulin aspart  0-9 Units Subcutaneous TID WC   ipratropium  0.5 mg Nebulization Q6H   isosorbide-hydrALAZINE  1 tablet Oral TID   levalbuterol  0.63 mg Nebulization Q6H   mouth rinse  15 mL Mouth Rinse BID   metoprolol tartrate  25 mg Oral BID   multivitamin with minerals  1 tablet Oral Daily   Ensure Max Protein  11 oz Oral BID   sodium chloride flush  3 mL Intravenous Q12H   torsemide  10 mg Oral Daily   Continuous Infusions:   LOS: 9 days    Kerney Elbe, DO [facility] PAGER is on AMION  If 7PM-7AM, please contact night-coverage [email or web address] Password [email or web address] 08/14/2018, 1:58 PM

## 2018-08-14 NOTE — Progress Notes (Signed)
Pt has refused daily weight and morning vitals. Pt states he wants to rest his arm and not use his right hand at all. RN will attempt vitals later. Pt in no acute distress. Will continue to monitor

## 2018-08-14 NOTE — Plan of Care (Signed)
  Problem: Clinical Measurements: Goal: Ability to maintain clinical measurements within normal limits will improve Outcome: Progressing   Problem: Education: Goal: Knowledge of General Education information will improve Description Including pain rating scale, medication(s)/side effects and non-pharmacologic comfort measures Outcome: Progressing   Problem: Health Behavior/Discharge Planning: Goal: Ability to manage health-related needs will improve Outcome: Progressing

## 2018-08-14 NOTE — Consult Note (Signed)
ORTHOPAEDIC CONSULTATION  REQUESTING PHYSICIAN: Kerney Elbe, DO  Chief Complaint: Right wrist pain  HPI: David [name] is a 73 y.o. male who was admitted on 3/19 for fever 104.9, generalized weakness, chills, dry cough.  Was subsequently intubated and placed in ICU.  Was ruled out for COVID.  Diagnosed with pneumonia and sepsis.  He has since improved and been moved to the floor.  He has been c/o right wrist pain which is improved per patient.  He denies h/o gout or any joint pains like this.  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  Ortho consulted to evaluate and r/o septic arthritis.  Past Medical History:  Diagnosis Date   Arthritis    CAD in native artery    s/p stent in 11/18   CHF (congestive heart failure) (HCC)    normal echo in 11/18   CKD (chronic kidney disease)    DDD (degenerative disc disease), cervical    Diabetes mellitus with complication (HCC)    Type II   Diabetic neuropathy (HCC)    Diabetic retinopathy (HCC)    GERD (gastroesophageal reflux disease)    Glaucoma    Hypertension    Past Surgical History:  Procedure Laterality Date   AMPUTATION Right 07/09/2016   Procedure: Right Great Toe Amputation at Metatarsophalangeal Joint;  Surgeon: [name], MD;  Location: [facility];  Service: Orthopedics;  Laterality: Right;   AMPUTATION Right 05/28/2018   Procedure: RIGHT SECOND TOE AMPUTATION;  Surgeon: [name], MD;  Location: [place];  Service: Orthopedics;  Laterality: Right;   BACK SURGERY     4   CARDIAC CATHETERIZATION     CORONARY STENT INTERVENTION N/A 04/06/2017   Procedure: CORONARY STENT INTERVENTION;  Surgeon: Nigel Mormon, MD;  Location: [facility];  Service: Cardiovascular;  Laterality: N/A;   CORONARY STENT INTERVENTION N/A 04/07/2017   Procedure: CORONARY STENT INTERVENTION;  Surgeon: Nigel Mormon, MD;  Location: [place] [facility];  Service: Cardiovascular;  Laterality:  N/A;   CYST EXCISION     on Back   ENDOTRACHEAL INTUBATION EMERGENT  08/07/2018       JOINT REPLACEMENT     LAPAROSCOPIC ABDOMINAL EXPLORATION N/A 11/26/2017   Procedure: LAPAROSCOPIC ABDOMINAL EXPLORATION, DRAINAGE OF APPENDICEAL ABCESS. PLACEMENT OF DRAIN;  Surgeon: Alphonsa Overall, MD;  Location: [facility];  Service: General;  Laterality: N/A;   RIGHT/LEFT HEART CATH AND CORONARY ANGIOGRAPHY N/A 04/06/2017   Procedure: RIGHT/LEFT HEART CATH AND CORONARY ANGIOGRAPHY;  Surgeon: Nigel Mormon, MD;  Location: [facility];  Service: Cardiovascular;  Laterality: N/A;   TOE SURGERY  05/2018   Left    TOTAL HIP ARTHROPLASTY     Right    Social History   Socioeconomic History   Marital status: Married    Spouse name: Not on file   Number of children: 2   Years of education: 16   Highest education level: Master's degree (e.g., MA, MS, MEng, MEd, MSW, MBA)  Occupational History   Occupation: Retired  Scientist, product/process development strain: Not hard at [employer] insecurity:    Worry: Not on file    Inability: Not on Lexicographer needs:    Medical: Not on file    Non-medical: Not on file  Tobacco Use   Smoking status: Never Smoker   Smokeless tobacco: Never Used  Substance and Sexual Activity   Alcohol use: Yes  Drug use: No   Sexual activity: Yes  Lifestyle   Physical activity:    Days per week: 0 days    Minutes per session: 0 min   Stress: Not at all  Relationships   Social connections:    Talks on phone: Twice a week    Gets together: Twice a week    Attends religious service: 1 to 4 times per year    Active member of club or organization: Yes    Attends meetings of clubs or organizations: 1 to 4 times per year    Relationship status: Married  Other Topics Concern   Not on file  Social History Narrative   Not on file   Family History  Problem Relation Age of Onset   Diabetes Other    Hyperlipidemia Other     Hypertension Other    Stroke Other    Alzheimer's disease Other    Thyroid disease Mother    Diabetes Mellitus II Father    Alzheimer's disease Father    - negative except otherwise stated in the family history section No Known Allergies Prior to Admission medications   Medication Sig Start Date End Date Taking? Authorizing Provider  acetaminophen (TYLENOL) 325 MG tablet Take 2 tablets (650 mg total) by mouth every 6 (six) hours as needed for mild pain or moderate pain (or Fever >/= 101). 12/09/17  Yes Patrecia Pour, MD  albuterol (PROVENTIL HFA;VENTOLIN HFA) 108 (90 Base) MCG/ACT inhaler Inhale 2 puffs into the lungs every 6 (six) hours as needed for wheezing or shortness of breath. 04/09/17  Yes Barton Dubois, MD  ALPRAZolam Duanne Moron) 1 MG tablet Take 1 tablet (1 mg total) by mouth 2 (two) times daily as needed for anxiety. 12/09/17  Yes Patrecia Pour, MD  amLODipine (NORVASC) 10 MG tablet Take 1 tablet (10 mg total) by mouth daily. 12/09/17  Yes Patrecia Pour, MD  aspirin EC 81 MG tablet Take 81 mg by mouth daily.   Yes [provider]  atorvastatin (LIPITOR) 80 MG tablet Take 1 tablet (80 mg total) by mouth at bedtime. 07/30/18  Yes Patwardhan, Manish J, MD  dorzolamide-timolol (COSOPT) 22.3-6.8 MG/ML ophthalmic solution Place 1 drop into both eyes 2 (two) times daily. 11/17/17  Yes [provider]  ferrous sulfate 325 (65 FE) MG tablet Take 325 mg by mouth daily with breakfast.   Yes [provider]  gabapentin (NEURONTIN) 600 MG tablet Take 600 mg by mouth 2 (two) times daily.  07/29/18  Yes [provider]  HUMALOG KWIKPEN 100 UNIT/ML KiwkPen Inject 4 Units into the skin 3 (three) times daily. 08/18/17  Yes [provider]  HYDROcodone-acetaminophen (NORCO) 10-325 MG tablet Take 1 tablet by mouth every 6 (six) hours as needed for moderate pain.   Yes [provider]  Insulin Detemir (LEVEMIR FLEXTOUCH) 100 UNIT/ML Pen Inject 20 Units into  the skin daily. 12/09/17  Yes Patrecia Pour, MD  isosorbide-hydrALAZINE (BIDIL) 20-37.5 MG tablet Take 1 tablet by mouth 3 (three) times daily. Patient taking differently: Take 2 tablets by mouth 3 (three) times daily.  09/08/16  Yes Ghimire, Henreitta Leber, MD  methocarbamol (ROBAXIN) 750 MG tablet Take 1 tablet (750 mg total) by mouth every 6 (six) hours as needed for muscle spasms. 09/08/16  Yes Ghimire, Henreitta Leber, MD  metoprolol succinate (TOPROL-XL) 100 MG 24 hr tablet TAKE 1 TABLET BY MOUTH EVERY DAY Patient taking differently: Take 100 mg by mouth daily.  06/24/18  Yes Miquel Dunn, NP  saccharomyces boulardii (FLORASTOR) 250 MG capsule Take 1 capsule (250 mg total) by mouth 2 (two) times daily. 12/09/17  Yes Patrecia Pour, MD  torsemide (DEMADEX) 20 MG tablet Take 10 mg by mouth daily.   Yes [provider]  fluticasone (FLONASE) 50 MCG/ACT nasal spray Place 1 spray into both nostrils daily. Patient not taking: Reported on 08/05/2018 04/09/17   Barton Dubois, MD  mupirocin ointment (BACTROBAN) 2 % Apply 1 application topically daily. Patient not taking: Reported on 08/05/2018 06/08/18   Rayburn, Neta Mends, PA-C   Ct Wrist Right Wo Contrast  Result Date: 08/14/2018 CLINICAL DATA:  Wrist injury and wrist pain. EXAM: CT OF THE RIGHT WRIST WITHOUT CONTRAST TECHNIQUE: Multidetector CT imaging of the right wrist was performed according to the standard protocol. Multiplanar CT image reconstructions were also generated. COMPARISON:  08/12/2018 FINDINGS: Bones/Joint/Cartilage Chondrocalcinosis observed notable the margins of the radiocarpal joint and along the scapholunate ligament region. There is chondrocalcinosis of the TFCC disc. Prominent loss of articular space the lunatotriquetral articulation. There is also degenerative loss of articular space in various portions the carpus, for example between the trapezium and trapezoid. Suspected erosion along the dorsal lunate on image 180/208.  No fracture is identified. Ligaments Suboptimally assessed by CT. Muscles and Tendons Grossly unremarkable Soft tissues Grossly unremarkable IMPRESSION: 1. Chondrocalcinosis with some erosions, possibly CPPD arthropathy. 2. No appreciable fracture. Electronically Signed   By: Van Clines M.D.   On: 08/14/2018 11:36   Dg Chest Port 1 View  Result Date: 08/14/2018 CLINICAL DATA:  Shortness of breath EXAM: PORTABLE CHEST 1 VIEW COMPARISON:  08/13/2018 FINDINGS: Mild increased interstitial markings, chronic. Mild bibasilar opacities. No pleural effusion or pneumothorax. Cardiomegaly. IMPRESSION: Mild chronic interstitial markings, favoring chronic interstitial lung disease. Mild bibasilar opacities, possibly atelectasis. Electronically Signed   By: Julian Hy M.D.   On: 08/14/2018 07:37   Dg Chest Port 1 View  Result Date: 08/13/2018 CLINICAL DATA:  Pneumonia, respiratory failure and ARDS. EXAM: PORTABLE CHEST 1 VIEW COMPARISON:  08/12/2018 FINDINGS: Stable top-normal heart size. Low bilateral lung volumes. Mild residual interstitial prominence without overt edema, focal airspace consolidation, pleural fluid or pneumothorax. IMPRESSION: Low bilateral lung volumes.  Mild residual interstitial prominence. Electronically Signed   By: Aletta Edouard M.D.   On: 08/13/2018 08:38   US Abdomen Limited Ruq  Result Date: 08/14/2018 CLINICAL DATA:  Abnormal liver function tests. EXAM: ULTRASOUND ABDOMEN LIMITED RIGHT UPPER QUADRANT COMPARISON:  CT scan of December 03, 2017. FINDINGS: Gallbladder: No gallstones or wall thickening visualized. No sonographic Murphy sign noted by sonographer. Common bile duct: Diameter: 4 mm which is within normal limits. Liver: No focal lesion identified. Within normal limits in parenchymal echogenicity. Portal vein is patent on color Doppler imaging with normal direction of blood flow towards the liver. IMPRESSION: No definite abnormality seen in the right upper quadrant of  the abdomen. Electronically Signed   By: Marijo Conception, M.D.   On: 08/14/2018 09:46   - pertinent xrays, CT, MRI studies were reviewed and independently interpreted  Positive ROS: All other systems have been reviewed and were otherwise negative with the exception of those mentioned in the HPI and as above.  Physical Exam: General: Alert, no acute distress Cardiovascular: No pedal edema Respiratory: No cyanosis, no use of accessory musculature GI: No organomegaly, abdomen is soft and non-tender Skin: No lesions in the area of chief complaint Neurologic: Sensation intact distally Psychiatric: Patient is competent for  consent with normal mood and affect Lymphatic: No axillary or cervical lymphadenopathy  MUSCULOSKELETAL:  - moderate generalized swelling of the hand and wrist worse on the dorsum - moderate pain with passive movement of wrist - NVI  Assessment: Right wrist pain secondary to CPPD flare up from recent stress of pneumonia, etc.  Plan: - right wrist joint was aspirated under sterile conditions without return of any joint fluid - treat symptomatically with ice, elevation, steroids  Thank you for the consult and the opportunity to see David [name]. Eduard Roux, MD [place] 8:14 PM

## 2018-08-15 ENCOUNTER — Inpatient Hospital Stay (HOSPITAL_COMMUNITY): Payer: Medicare Other

## 2018-08-15 DIAGNOSIS — M25431 Effusion, right wrist: Secondary | ICD-10-CM

## 2018-08-15 DIAGNOSIS — I130 Hypertensive heart and chronic kidney disease with heart failure and stage 1 through stage 4 chronic kidney disease, or unspecified chronic kidney disease: Secondary | ICD-10-CM

## 2018-08-15 DIAGNOSIS — Z794 Long term (current) use of insulin: Secondary | ICD-10-CM

## 2018-08-15 DIAGNOSIS — D72829 Elevated white blood cell count, unspecified: Secondary | ICD-10-CM

## 2018-08-15 DIAGNOSIS — L84 Corns and callosities: Secondary | ICD-10-CM

## 2018-08-15 DIAGNOSIS — E1122 Type 2 diabetes mellitus with diabetic chronic kidney disease: Secondary | ICD-10-CM

## 2018-08-15 DIAGNOSIS — M25531 Pain in right wrist: Secondary | ICD-10-CM

## 2018-08-15 DIAGNOSIS — Z89411 Acquired absence of right great toe: Secondary | ICD-10-CM

## 2018-08-15 DIAGNOSIS — N183 Chronic kidney disease, stage 3 (moderate): Secondary | ICD-10-CM

## 2018-08-15 DIAGNOSIS — I272 Pulmonary hypertension, unspecified: Secondary | ICD-10-CM

## 2018-08-15 LAB — CBC WITH DIFFERENTIAL/PLATELET
Abs Immature Granulocytes: 0.26 10*3/uL — ABNORMAL HIGH (ref 0.00–0.07)
BASOS PCT: 0 %
Basophils Absolute: 0 10*3/uL (ref 0.0–0.1)
EOS ABS: 0 10*3/uL (ref 0.0–0.5)
Eosinophils Relative: 0 %
HCT: 25.2 % — ABNORMAL LOW (ref 39.0–52.0)
Hemoglobin: 7.8 g/dL — ABNORMAL LOW (ref 13.0–17.0)
Immature Granulocytes: 1 %
Lymphocytes Relative: 5 %
Lymphs Abs: 0.9 10*3/uL (ref 0.7–4.0)
MCH: 33.2 pg (ref 26.0–34.0)
MCHC: 31 g/dL (ref 30.0–36.0)
MCV: 107.2 fL — ABNORMAL HIGH (ref 80.0–100.0)
Monocytes Absolute: 0.4 10*3/uL (ref 0.1–1.0)
Monocytes Relative: 2 %
Neutro Abs: 18 10*3/uL — ABNORMAL HIGH (ref 1.7–7.7)
Neutrophils Relative %: 92 %
Platelets: 315 10*3/uL (ref 150–400)
RBC: 2.35 MIL/uL — ABNORMAL LOW (ref 4.22–5.81)
RDW: 11.9 % (ref 11.5–15.5)
WBC: 19.5 10*3/uL — AB (ref 4.0–10.5)
nRBC: 0 % (ref 0.0–0.2)

## 2018-08-15 LAB — URINALYSIS, ROUTINE W REFLEX MICROSCOPIC
Bacteria, UA: NONE SEEN
Bilirubin Urine: NEGATIVE
Glucose, UA: 150 mg/dL — AB
Ketones, ur: 5 mg/dL — AB
Leukocytes,Ua: NEGATIVE
Nitrite: NEGATIVE
Protein, ur: 100 mg/dL — AB
Specific Gravity, Urine: 1.021 (ref 1.005–1.030)
pH: 5 (ref 5.0–8.0)

## 2018-08-15 LAB — COMPREHENSIVE METABOLIC PANEL
ALBUMIN: 2.5 g/dL — AB (ref 3.5–5.0)
ALT: 65 U/L — ABNORMAL HIGH (ref 0–44)
AST: 65 U/L — ABNORMAL HIGH (ref 15–41)
Alkaline Phosphatase: 197 U/L — ABNORMAL HIGH (ref 38–126)
Anion gap: 9 (ref 5–15)
BUN: 15 mg/dL (ref 8–23)
CO2: 23 mmol/L (ref 22–32)
Calcium: 8.4 mg/dL — ABNORMAL LOW (ref 8.9–10.3)
Chloride: 101 mmol/L (ref 98–111)
Creatinine, Ser: 1.88 mg/dL — ABNORMAL HIGH (ref 0.61–1.24)
GFR calc Af Amer: 41 mL/min — ABNORMAL LOW (ref >60)
GFR calc non Af Amer: 35 mL/min — ABNORMAL LOW (ref >60)
Glucose, Bld: 350 mg/dL — ABNORMAL HIGH (ref 70–99)
Potassium: 4.4 mmol/L (ref 3.5–5.1)
Sodium: 133 mmol/L — ABNORMAL LOW (ref 135–145)
Total Bilirubin: 1 mg/dL (ref 0.3–1.2)
Total Protein: 7.5 g/dL (ref 6.5–8.1)

## 2018-08-15 LAB — GLUCOSE, CAPILLARY
Glucose-Capillary: 297 mg/dL — ABNORMAL HIGH (ref 70–99)
Glucose-Capillary: 447 mg/dL — ABNORMAL HIGH (ref 70–99)

## 2018-08-15 LAB — CULTURE, BLOOD (ROUTINE X 2)
Culture: NO GROWTH
Culture: NO GROWTH
Special Requests: ADEQUATE
Special Requests: ADEQUATE

## 2018-08-15 LAB — MAGNESIUM: Magnesium: 1.9 mg/dL (ref 1.7–2.4)

## 2018-08-15 LAB — PHOSPHORUS: Phosphorus: 3.6 mg/dL (ref 2.5–4.6)

## 2018-08-15 LAB — PROCALCITONIN: Procalcitonin: 0.36 ng/mL

## 2018-08-15 MED ORDER — ADULT MULTIVITAMIN W/MINERALS CH: 1.0000 | ORAL_TABLET | Freq: Every day | ORAL | 0 refills | Status: DC

## 2018-08-15 MED ORDER — INSULIN ASPART 100 UNIT/ML ~~LOC~~ SOLN
7.0000 [IU] | Freq: Once | SUBCUTANEOUS | Status: AC
  Administered 2018-08-15: 7 [IU] via SUBCUTANEOUS

## 2018-08-15 MED ORDER — LEVALBUTEROL HCL 0.63 MG/3ML IN NEBU: 0.6300 mg | INHALATION_SOLUTION | Freq: Four times a day (QID) | RESPIRATORY_TRACT | Status: DC | PRN

## 2018-08-15 MED ORDER — INSULIN ASPART 100 UNIT/ML ~~LOC~~ SOLN: 0.0000 [IU] | Freq: Every day | SUBCUTANEOUS | Status: DC

## 2018-08-15 MED ORDER — PREDNISONE 20 MG PO TABS: 40.0000 mg | ORAL_TABLET | Freq: Every day | ORAL | 0 refills | Status: DC

## 2018-08-15 MED ORDER — GUAIFENESIN ER 600 MG PO TB12: 600.0000 mg | ORAL_TABLET | Freq: Two times a day (BID) | ORAL | 0 refills | Status: DC

## 2018-08-15 MED ORDER — DICYCLOMINE HCL 10 MG PO CAPS: 10.0000 mg | ORAL_CAPSULE | Freq: Three times a day (TID) | ORAL | 0 refills | Status: DC

## 2018-08-15 MED ORDER — ENSURE MAX PROTEIN PO LIQD: 11.0000 [oz_av] | Freq: Two times a day (BID) | ORAL | 0 refills | Status: DC

## 2018-08-15 MED ORDER — INSULIN DETEMIR 100 UNIT/ML ~~LOC~~ SOLN
20.0000 [IU] | Freq: Every day | SUBCUTANEOUS | Status: DC
  Administered 2018-08-15: 20 [IU] via SUBCUTANEOUS
  Filled 2018-08-15: qty 0.2

## 2018-08-15 MED ORDER — INSULIN ASPART 100 UNIT/ML ~~LOC~~ SOLN
0.0000 [IU] | Freq: Three times a day (TID) | SUBCUTANEOUS | Status: DC
  Administered 2018-08-15: 8 [IU] via SUBCUTANEOUS
  Administered 2018-08-15: 15 [IU] via SUBCUTANEOUS

## 2018-08-15 NOTE — Progress Notes (Signed)
PT Cancellation Note  Patient Details Name: David [name] MRN: [id number] DOB: [birth year]   Cancelled Treatment:    Reason Eval/Treat Not Completed: Other (comment)(Note pt left AMA prior to PT being able to see him)   Denice Paradise 08/15/2018, 2:16 PM  Smith [place],PT Acute Rehabilitation Services Pager:  [phone]  Office:  [phone]

## 2018-08-15 NOTE — Consult Note (Signed)
[facility]    Date of Admission:  08/05/2018 Total days of antibiotics: 3-19 --> 3-25.        Ceftriaxone 3-28 -->               Reason for Consult: Leukocytosis    Referring Provider: Alfredia Ferguson   Assessment: Leukocytosis HTN DM2  Plan: 1. Stop ceftriaxone 2. D/c to home with PCP f/u when amenable.  Comment- The etiology of his previous WBC (12.6 --> 17.0 ) is unclear. Would suggest flare of his CPPD. It also be due to adverse drug reaction. I am concerned that it has increased further and will continue to do so due to prednisone. I do not see any systemic underlying illness that would require him to stay in hospital.    Thank you so much for this interesting consult, Available as needed.   Principal Problem:   Multifocal pneumonia Active Problems:   Insulin-requiring or dependent type II diabetes mellitus ([place])   Acute on chronic respiratory failure with hypoxia (HCC)   Chronic heart failure with preserved ejection fraction (HFpEF) (HCC)   Bilateral carotid artery stenosis   Coronary artery disease involving native coronary artery of native heart without angina pectoris   AKI (acute kidney injury) (HCC)   Pain and swelling of wrist, right    amLODipine  5 mg Oral Daily   aspirin  81 mg Oral Daily   atorvastatin  80 mg Oral q1800   dicyclomine  10 mg Oral TID AC & HS   dorzolamide-timolol  1 drop Both Eyes BID   guaiFENesin  1,200 mg Oral BID   heparin injection (subcutaneous)  5,000 Units Subcutaneous Q8H   insulin aspart  0-15 Units Subcutaneous TID WC   insulin aspart  0-5 Units Subcutaneous QHS   isosorbide-hydrALAZINE  1 tablet Oral TID   mouth rinse  15 mL Mouth Rinse BID   metoprolol tartrate  25 mg Oral BID   multivitamin with minerals  1 tablet Oral Daily   predniSONE  40 mg Oral Q breakfast   Ensure Max Protein  11 oz Oral BID   sodium chloride flush  3 mL Intravenous Q12H   torsemide  10 mg Oral  Daily    HPI: David [name] is a 73 y.o. male with hx of DM2 (x ~10 yrs), CKD3, pulm HTN, diastolic CHF, comes to ED on 3-19 with fever 104.9 and dry cough for 24h.  He was started on cefepime, flagyl and vanco after his  CXR revealed 1. Low volume chest with bilateral fine interstitial opacity. History of fever suggest bronchitis or interstitial pneumonia. 2. Chronic cardiomegaly And Cr was 2.55 (prev 1.52).  His WBC was 11.9 and increased to 31.9.  He was isolated for COVID and changed to ceftriaxone/azithro.   His BCx grew 1/2 Strep (viridans) species that were felt to be contaminants.  By 3-21 he developed worsening hypoxia (PO2 47 after walking to bathroom) and his CXR was felt to be consistent with ARDS. He required intubation. His Procalcitonin increased from 1.28 to 59.31.  His anbx were changed to zosyn/azithro on 3-22.  His COVID test returned (-). He was able to be extubated on 3-25.  By 3-27 he began to have worsening leukocytosis and also noted R wrist pain. His ceftriaxone was restarted. He was seen by ortho and had R wrist arthrocentesis (no fluid found). He was felt to have CPPD. His CT showed Chondrocalcinosis with  some erosions, possibly CPPD arthropathy.  Today his WBC is 19.5. His CXR is (-). He was started on prednisone 3-28.   Review of Systems: Review of Systems  Constitutional: Negative for chills and fever.  Eyes: Negative for blurred vision.  Respiratory: Negative for cough and sputum production.   Cardiovascular: Negative for chest pain.  Gastrointestinal: Negative for constipation and diarrhea.  Genitourinary: Negative for dysuria.  Neurological: Positive for sensory change.  Please see HPI. All other systems reviewed and negative.   Past Medical History:  Diagnosis Date   Arthritis    CAD in native artery    s/p stent in 11/18   CHF (congestive heart failure) (HCC)    normal echo in 11/18   CKD (chronic kidney disease)    DDD (degenerative  disc disease), cervical    Diabetes mellitus with complication (HCC)    Type II   Diabetic neuropathy (HCC)    Diabetic retinopathy (HCC)    GERD (gastroesophageal reflux disease)    Glaucoma    Hypertension     Social History   Tobacco Use   Smoking status: Never Smoker   Smokeless tobacco: Never Used  Substance Use Topics   Alcohol use: Yes   Drug use: No    Family History  Problem Relation Age of Onset   Diabetes Other    Hyperlipidemia Other    Hypertension Other    Stroke Other    Alzheimer's disease Other    Thyroid disease Mother    Diabetes Mellitus II Father    Alzheimer's disease Father      Medications:  Scheduled:  amLODipine  5 mg Oral Daily   aspirin  81 mg Oral Daily   atorvastatin  80 mg Oral q1800   dicyclomine  10 mg Oral TID AC & HS   dorzolamide-timolol  1 drop Both Eyes BID   guaiFENesin  1,200 mg Oral BID   heparin injection (subcutaneous)  5,000 Units Subcutaneous Q8H   insulin aspart  0-15 Units Subcutaneous TID WC   insulin aspart  0-5 Units Subcutaneous QHS   isosorbide-hydrALAZINE  1 tablet Oral TID   mouth rinse  15 mL Mouth Rinse BID   metoprolol tartrate  25 mg Oral BID   multivitamin with minerals  1 tablet Oral Daily   predniSONE  40 mg Oral Q breakfast   Ensure Max Protein  11 oz Oral BID   sodium chloride flush  3 mL Intravenous Q12H   torsemide  10 mg Oral Daily    Abtx:  Anti-infectives (From admission, onward)   Start     Dose/Rate Route Frequency Ordered Stop   08/14/18 1600  cefTRIAXone (ROCEPHIN) 2 g in sodium chloride 0.9 % 100 mL IVPB     2 g 200 mL/hr over 30 Minutes Intravenous Every 24 hours 08/14/18 1416     08/09/18 1000  azithromycin (ZITHROMAX) 200 MG/5ML suspension 500 mg     500 mg Per Tube Daily 08/09/18 0405 08/11/18 0908   08/08/18 1630  piperacillin-tazobactam (ZOSYN) IVPB 3.375 g  Status:  Discontinued     3.375 g 12.5 mL/hr over 240 Minutes Intravenous Every 8  hours 08/08/18 1625 08/11/18 1148   08/08/18 1000  azithromycin (ZITHROMAX) 200 MG/5ML suspension 500 mg  Status:  Discontinued     500 mg Per Tube Daily 08/08/18 0407 08/09/18 0405   08/07/18 1330  azithromycin (ZITHROMAX) 200 MG/5ML suspension 500 mg  Status:  Discontinued     500 mg Per Tube  Daily 08/07/18 1324 08/08/18 0407   08/07/18 1300  piperacillin-tazobactam (ZOSYN) IVPB 3.375 g  Status:  Discontinued     3.375 g 12.5 mL/hr over 240 Minutes Intravenous Every 8 hours 08/07/18 1240 08/07/18 1325   08/06/18 0600  vancomycin (VANCOCIN) 1,750 mg in sodium chloride 0.9 % 500 mL IVPB  Status:  Discontinued     1,750 mg 250 mL/hr over 120 Minutes Intravenous Every 24 hours 08/05/18 0434 08/05/18 1049   08/05/18 1700  ceFEPIme (MAXIPIME) 2 g in sodium chloride 0.9 % 100 mL IVPB  Status:  Discontinued     2 g 200 mL/hr over 30 Minutes Intravenous Every 12 hours 08/05/18 0434 08/05/18 1049   08/05/18 1230  azithromycin (ZITHROMAX) tablet 500 mg  Status:  Discontinued     500 mg Oral Every 24 hours 08/05/18 1049 08/07/18 1324   08/05/18 1200  cefTRIAXone (ROCEPHIN) 2 g in sodium chloride 0.9 % 100 mL IVPB  Status:  Discontinued     2 g 200 mL/hr over 30 Minutes Intravenous Every 24 hours 08/05/18 1049 08/07/18 1220   08/05/18 0415  ceFEPIme (MAXIPIME) 2 g in sodium chloride 0.9 % 100 mL IVPB     2 g 200 mL/hr over 30 Minutes Intravenous  Once 08/05/18 0404 08/05/18 0447   08/05/18 0415  metroNIDAZOLE (FLAGYL) IVPB 500 mg     500 mg 100 mL/hr over 60 Minutes Intravenous  Once 08/05/18 0404 08/05/18 0520   08/05/18 0415  vancomycin (VANCOCIN) IVPB 1000 mg/200 mL premix  Status:  Discontinued     1,000 mg 200 mL/hr over 60 Minutes Intravenous  Once 08/05/18 0404 08/05/18 0407   08/05/18 0415  vancomycin (VANCOCIN) 2,000 mg in sodium chloride 0.9 % 500 mL IVPB     2,000 mg 250 mL/hr over 120 Minutes Intravenous  Once 08/05/18 0407 08/05/18 0923        OBJECTIVE: Blood pressure (!)  154/76, pulse 97, temperature 98 F (36.7 C), temperature source Oral, resp. rate 20, height 6\' 2"  (1.88 m), weight 94.8 kg, SpO2 99 %.  Physical Exam Constitutional:      General: He is not in acute distress.    Appearance: Normal appearance. He is not ill-appearing or toxic-appearing.  HENT:     Mouth/Throat:     Mouth: Mucous membranes are moist.     Pharynx: No oropharyngeal exudate.  Eyes:     Extraocular Movements: Extraocular movements intact.     Pupils: Pupils are equal, round, and reactive to light.  Neck:     Musculoskeletal: Normal range of motion and neck supple.  Cardiovascular:     Rate and Rhythm: Normal rate and regular rhythm.  Pulmonary:     Effort: Pulmonary effort is normal.     Breath sounds: Normal breath sounds.  Abdominal:     General: Bowel sounds are normal. There is no distension.     Palpations: Abdomen is soft.     Tenderness: There is no abdominal tenderness.  Musculoskeletal:       Arms:     Right lower leg: No edema.     Left lower leg: No edema.       Feet:  Neurological:     Mental Status: He is alert.     Cranial Nerves: No cranial nerve deficit.     Sensory: No sensory deficit.  Psychiatric:        Mood and Affect: Mood normal.     Lab Results Results for orders placed or  performed during the hospital encounter of 08/05/18 (from the past 48 hour(s))  Glucose, capillary     Status: Abnormal   Collection Time: 08/13/18 12:10 PM  Result Value Ref Range   Glucose-Capillary 185 (H) 70 - 99 mg/dL  Glucose, capillary     Status: Abnormal   Collection Time: 08/13/18  5:21 PM  Result Value Ref Range   Glucose-Capillary 265 (H) 70 - 99 mg/dL  Glucose, capillary     Status: Abnormal   Collection Time: 08/13/18  9:23 PM  Result Value Ref Range   Glucose-Capillary 201 (H) 70 - 99 mg/dL   Comment 1 Notify RN   CBC with Differential/Platelet     Status: Abnormal   Collection Time: 08/14/18  3:50 AM  Result Value Ref Range   WBC 17.0 (H)  4.0 - 10.5 K/uL   RBC 2.30 (L) 4.22 - 5.81 MIL/uL   Hemoglobin 7.6 (L) 13.0 - 17.0 g/dL   HCT 24.5 (L) 39.0 - 52.0 %   MCV 106.5 (H) 80.0 - 100.0 fL   MCH 33.0 26.0 - 34.0 pg   MCHC 31.0 30.0 - 36.0 g/dL   RDW 11.7 11.5 - 15.5 %   Platelets 304 150 - 400 K/uL   nRBC 0.0 0.0 - 0.2 %   Neutrophils Relative % 76 %   Neutro Abs 12.9 (H) 1.7 - 7.7 K/uL   Lymphocytes Relative 14 %   Lymphs Abs 2.3 0.7 - 4.0 K/uL   Monocytes Relative 9 %   Monocytes Absolute 1.5 (H) 0.1 - 1.0 K/uL   Eosinophils Relative 0 %   Eosinophils Absolute 0.0 0.0 - 0.5 K/uL   Basophils Relative 0 %   Basophils Absolute 0.0 0.0 - 0.1 K/uL   Immature Granulocytes 1 %   Abs Immature Granulocytes 0.23 (H) 0.00 - 0.07 K/uL    Comment: Performed at [facility], [address] [place], [place] [address]  Comprehensive metabolic panel     Status: Abnormal   Collection Time: 08/14/18  3:50 AM  Result Value Ref Range   Sodium 139 135 - 145 mmol/L   Potassium 3.4 (L) 3.5 - 5.1 mmol/L   Chloride 104 98 - 111 mmol/L   CO2 23 22 - 32 mmol/L   Glucose, Bld 214 (H) 70 - 99 mg/dL   BUN 12 8 - 23 mg/dL   Creatinine, Ser 1.74 (H) 0.61 - 1.24 mg/dL   Calcium 8.4 (L) 8.9 - 10.3 mg/dL   Total Protein 7.6 6.5 - 8.1 g/dL   Albumin 2.4 (L) 3.5 - 5.0 g/dL   AST 57 (H) 15 - 41 U/L   ALT 48 (H) 0 - 44 U/L   Alkaline Phosphatase 187 (H) 38 - 126 U/L   Total Bilirubin 1.1 0.3 - 1.2 mg/dL   GFR calc non Af Amer 39 (L) >60 mL/min   GFR calc Af Amer 45 (L) >60 mL/min   Anion gap 12 5 - 15    Comment: Performed at [facility], [place] [address] [place], [place] [address]  Magnesium     Status: None   Collection Time: 08/14/18  3:50 AM  Result Value Ref Range   Magnesium 1.8 1.7 - 2.4 mg/dL    Comment: Performed at [facility] [address] [place], [place] [address]  Phosphorus     Status: None   Collection Time: 08/14/18  3:50 AM  Result Value Ref Range   Phosphorus 3.6 2.5 - 4.6  mg/dL    Comment:  Performed at [facility], [place] [address], [place], [place] [address]  Glucose, capillary     Status: Abnormal   Collection Time: 08/14/18 11:39 AM  Result Value Ref Range   Glucose-Capillary 268 (H) 70 - 99 mg/dL   Comment 1 Notify RN    Comment 2 Document in Chart   Glucose, capillary     Status: Abnormal   Collection Time: 08/14/18  4:41 PM  Result Value Ref Range   Glucose-Capillary 285 (H) 70 - 99 mg/dL  Glucose, capillary     Status: Abnormal   Collection Time: 08/14/18  9:08 PM  Result Value Ref Range   Glucose-Capillary 292 (H) 70 - 99 mg/dL  Procalcitonin     Status: None   Collection Time: 08/15/18  3:17 AM  Result Value Ref Range   Procalcitonin 0.36 ng/mL    Comment:        Interpretation: PCT (Procalcitonin) <= 0.5 ng/mL: Systemic infection (sepsis) is not likely. Local bacterial infection is possible. (NOTE)       Sepsis PCT Algorithm           Lower Respiratory Tract                                      Infection PCT Algorithm    ----------------------------     ----------------------------         PCT < 0.25 ng/mL                PCT < 0.10 ng/mL         Strongly encourage             Strongly discourage   discontinuation of antibiotics    initiation of antibiotics    ----------------------------     -----------------------------       PCT 0.25 - 0.50 ng/mL            PCT 0.10 - 0.25 ng/mL               OR       >80% decrease in PCT            Discourage initiation of                                            antibiotics      Encourage discontinuation           of antibiotics    ----------------------------     -----------------------------         PCT >= 0.50 ng/mL              PCT 0.26 - 0.50 ng/mL               AND        <80% decrease in PCT             Encourage initiation of                                             antibiotics       Encourage continuation           of  antibiotics    ----------------------------      -----------------------------        PCT >= 0.50 ng/mL                  PCT > 0.50 ng/mL               AND         increase in PCT                  Strongly encourage                                      initiation of antibiotics    Strongly encourage escalation           of antibiotics                                     -----------------------------                                           PCT <= 0.25 ng/mL                                                 OR                                        > 80% decrease in PCT                                     Discontinue / Do not initiate                                             antibiotics Performed at [facility], [address] [place], [place] [address]   CBC with Differential/Platelet     Status: Abnormal   Collection Time: 08/15/18  3:17 AM  Result Value Ref Range   WBC 19.5 (H) 4.0 - 10.5 K/uL   RBC 2.35 (L) 4.22 - 5.81 MIL/uL   Hemoglobin 7.8 (L) 13.0 - 17.0 g/dL   HCT 25.2 (L) 39.0 - 52.0 %   MCV 107.2 (H) 80.0 - 100.0 fL   MCH 33.2 26.0 - 34.0 pg   MCHC 31.0 30.0 - 36.0 g/dL   RDW 11.9 11.5 - 15.5 %   Platelets 315 150 - 400 K/uL   nRBC 0.0 0.0 - 0.2 %   Neutrophils Relative % 92 %   Neutro Abs 18.0 (H) 1.7 - 7.7 K/uL   Lymphocytes Relative 5 %   Lymphs Abs 0.9 0.7 - 4.0 K/uL   Monocytes Relative 2 %   Monocytes Absolute 0.4 0.1 - 1.0 K/uL   Eosinophils Relative 0 %   Eosinophils Absolute 0.0 0.0 - 0.5 K/uL   Basophils Relative 0 %  Basophils Absolute 0.0 0.0 - 0.1 K/uL   Immature Granulocytes 1 %   Abs Immature Granulocytes 0.26 (H) 0.00 - 0.07 K/uL    Comment: Performed at [facility], [place] [address] [place], [place] [address]  Comprehensive metabolic panel     Status: Abnormal   Collection Time: 08/15/18  3:17 AM  Result Value Ref Range   Sodium 133 (L) 135 - 145 mmol/L   Potassium 4.4 3.5 - 5.1 mmol/L   Chloride 101 98 - 111 mmol/L   CO2 23 22 - 32 mmol/L   Glucose, Bld 350 (H) 70 - 99  mg/dL   BUN 15 8 - 23 mg/dL   Creatinine, Ser 1.88 (H) 0.61 - 1.24 mg/dL   Calcium 8.4 (L) 8.9 - 10.3 mg/dL   Total Protein 7.5 6.5 - 8.1 g/dL   Albumin 2.5 (L) 3.5 - 5.0 g/dL   AST 65 (H) 15 - 41 U/L   ALT 65 (H) 0 - 44 U/L   Alkaline Phosphatase 197 (H) 38 - 126 U/L   Total Bilirubin 1.0 0.3 - 1.2 mg/dL   GFR calc non Af Amer 35 (L) >60 mL/min   GFR calc Af Amer 41 (L) >60 mL/min   Anion gap 9 5 - 15    Comment: Performed at [facility], [place] [address] [place], [place] [address]  Magnesium     Status: None   Collection Time: 08/15/18  3:17 AM  Result Value Ref Range   Magnesium 1.9 1.7 - 2.4 mg/dL    Comment: Performed at [facility] [address] [place], [place] [address]  Phosphorus     Status: None   Collection Time: 08/15/18  3:17 AM  Result Value Ref Range   Phosphorus 3.6 2.5 - 4.6 mg/dL    Comment: Performed at [facility] [address] [place], [place] [address]  Glucose, capillary     Status: Abnormal   Collection Time: 08/15/18  8:21 AM  Result Value Ref Range   Glucose-Capillary 297 (H) 70 - 99 mg/dL  Urinalysis, Routine w reflex microscopic     Status: Abnormal   Collection Time: 08/15/18  9:11 AM  Result Value Ref Range   Color, Urine YELLOW YELLOW   APPearance HAZY (A) CLEAR   Specific Gravity, Urine 1.021 1.005 - 1.030   pH 5.0 5.0 - 8.0   Glucose, UA 150 (A) NEGATIVE mg/dL   Hgb urine dipstick SMALL (A) NEGATIVE   Bilirubin Urine NEGATIVE NEGATIVE   Ketones, ur 5 (A) NEGATIVE mg/dL   Protein, ur 100 (A) NEGATIVE mg/dL   Nitrite NEGATIVE NEGATIVE   Leukocytes,Ua NEGATIVE NEGATIVE   RBC / HPF 0-5 0 - 5 RBC/hpf   WBC, UA 0-5 0 - 5 WBC/hpf   Bacteria, UA NONE SEEN NONE SEEN   Squamous Epithelial / LPF 0-5 0 - 5   Mucus PRESENT    Hyaline Casts, UA PRESENT     Comment: Performed at [facility], [place] [address] [place], [place] [address]  Glucose, capillary     Status: Abnormal   Collection Time: 08/15/18 11:35 AM   Result Value Ref Range   Glucose-Capillary 447 (H) 70 - 99 mg/dL   Comment 1 Notify RN    Comment 2 Document in Chart       Component Value Date/Time   SDES BLOOD BLOOD RIGHT HAND 08/10/2018 1331   SDES BLOOD RIGHT ANTECUBITAL 08/10/2018 1331   SPECREQUEST  08/10/2018 1331    BOTTLES DRAWN AEROBIC AND ANAEROBIC Blood Culture adequate volume   SPECREQUEST  08/10/2018 1331    BOTTLES DRAWN AEROBIC AND ANAEROBIC Blood Culture adequate volume   CULT  08/10/2018 1331    NO GROWTH 4 DAYS Performed at [facility], [place] [address] [place], [place] [address]    CULT  08/10/2018 1331    NO GROWTH 4 DAYS Performed at [facility], [place] [address] [place], [place] [address]    REPTSTATUS PENDING 08/10/2018 1331   REPTSTATUS PENDING 08/10/2018 1331   Ct Wrist Right Wo Contrast  Result Date: 08/14/2018 CLINICAL DATA:  Wrist injury and wrist pain. EXAM: CT OF THE RIGHT WRIST WITHOUT CONTRAST TECHNIQUE: Multidetector CT imaging of the right wrist was performed according to the standard protocol. Multiplanar CT image reconstructions were also generated. COMPARISON:  08/12/2018 FINDINGS: Bones/Joint/Cartilage Chondrocalcinosis observed notable the margins of the radiocarpal joint and along the scapholunate ligament region. There is chondrocalcinosis of the TFCC disc. Prominent loss of articular space the lunatotriquetral articulation. There is also degenerative loss of articular space in various portions the carpus, for example between the trapezium and trapezoid. Suspected erosion along the dorsal lunate on image 180/208. No fracture is identified. Ligaments Suboptimally assessed by CT. Muscles and Tendons Grossly unremarkable Soft tissues Grossly unremarkable IMPRESSION: 1. Chondrocalcinosis with some erosions, possibly CPPD arthropathy. 2. No appreciable fracture. Electronically Signed   By: Van Clines M.D.   On: 08/14/2018 11:36   Dg Chest Port 1 View  Result Date:  08/15/2018 CLINICAL DATA:  Shortness of breath EXAM: PORTABLE CHEST 1 VIEW COMPARISON:  08/14/2018 FINDINGS: Faint chronic interstitial markings in the subpleural lungs bilaterally, left greater than right. No focal consolidation. No pleural effusion or pneumothorax. Cardiomegaly.  Coronary atherosclerosis. IMPRESSION: No evidence of acute cardiopulmonary disease. Electronically Signed   By: Julian Hy M.D.   On: 08/15/2018 08:09   Dg Chest Port 1 View  Result Date: 08/14/2018 CLINICAL DATA:  Shortness of breath EXAM: PORTABLE CHEST 1 VIEW COMPARISON:  08/13/2018 FINDINGS: Mild increased interstitial markings, chronic. Mild bibasilar opacities. No pleural effusion or pneumothorax. Cardiomegaly. IMPRESSION: Mild chronic interstitial markings, favoring chronic interstitial lung disease. Mild bibasilar opacities, possibly atelectasis. Electronically Signed   By: Julian Hy M.D.   On: 08/14/2018 07:37   US Abdomen Limited Ruq  Result Date: 08/14/2018 CLINICAL DATA:  Abnormal liver function tests. EXAM: ULTRASOUND ABDOMEN LIMITED RIGHT UPPER QUADRANT COMPARISON:  CT scan of December 03, 2017. FINDINGS: Gallbladder: No gallstones or wall thickening visualized. No sonographic Murphy sign noted by sonographer. Common bile duct: Diameter: 4 mm which is within normal limits. Liver: No focal lesion identified. Within normal limits in parenchymal echogenicity. Portal vein is patent on color Doppler imaging with normal direction of blood flow towards the liver. IMPRESSION: No definite abnormality seen in the right upper quadrant of the abdomen. Electronically Signed   By: Marijo Conception, M.D.   On: 08/14/2018 09:46   Recent Results (from the past 240 hour(s))  MRSA PCR Screening     Status: None   Collection Time: 08/07/18 12:12 PM  Result Value Ref Range Status   MRSA by PCR NEGATIVE NEGATIVE Final    Comment:        The GeneXpert MRSA Assay (FDA approved for NASAL specimens only), is one component  of a comprehensive MRSA colonization surveillance program. It is not intended to diagnose MRSA infection nor to guide or monitor treatment for MRSA  infections. Performed at [facility], [place] [address] [place], [place] [address]   Culture, respiratory (non-expectorated)     Status: None   Collection Time: 08/07/18  2:30 PM  Result Value Ref Range Status   Specimen Description TRACHEAL ASPIRATE  Final   Special Requests NONE  Final   Gram Stain   Final    MODERATE WBC PRESENT, PREDOMINANTLY PMN NO ORGANISMS SEEN    Culture   Final    FEW DIPHTHEROIDS(CORYNEBACTERIUM SPECIES) Standardized susceptibility testing for this organism is not available. Performed at [facility], [place] [address], [place], [place] [address]    Report Status 08/10/2018 FINAL  Final  Respiratory Panel by PCR     Status: None   Collection Time: 08/08/18  9:22 AM  Result Value Ref Range Status   Adenovirus NOT DETECTED NOT DETECTED Final   Coronavirus 229E NOT DETECTED NOT DETECTED Final    Comment: (NOTE) The Coronavirus on the Respiratory Panel, DOES NOT test for the novel  Coronavirus (2019 nCoV)    Coronavirus HKU1 NOT DETECTED NOT DETECTED Final   Coronavirus NL63 NOT DETECTED NOT DETECTED Final   Coronavirus OC43 NOT DETECTED NOT DETECTED Final   Metapneumovirus NOT DETECTED NOT DETECTED Final   Rhinovirus / Enterovirus NOT DETECTED NOT DETECTED Final   Influenza A NOT DETECTED NOT DETECTED Final   Influenza B NOT DETECTED NOT DETECTED Final   Parainfluenza Virus 1 NOT DETECTED NOT DETECTED Final   Parainfluenza Virus 2 NOT DETECTED NOT DETECTED Final   Parainfluenza Virus 3 NOT DETECTED NOT DETECTED Final   Parainfluenza Virus 4 NOT DETECTED NOT DETECTED Final   Respiratory Syncytial Virus NOT DETECTED NOT DETECTED Final   Bordetella pertussis NOT DETECTED NOT DETECTED Final   Chlamydophila pneumoniae NOT DETECTED NOT DETECTED Final   Mycoplasma pneumoniae NOT DETECTED NOT DETECTED  Final    Comment: Performed at [facility], [address], [place], [place] [address]  Culture, blood (Routine X 2) w Reflex to ID Panel     Status: None (Preliminary result)   Collection Time: 08/10/18  1:31 PM  Result Value Ref Range Status   Specimen Description BLOOD BLOOD RIGHT HAND  Final   Special Requests   Final    BOTTLES DRAWN AEROBIC AND ANAEROBIC Blood Culture adequate volume   Culture   Final    NO GROWTH 4 DAYS Performed at [facility], [place] [address], [place], [place] [address]    Report Status PENDING  Incomplete  Culture, blood (Routine X 2) w Reflex to ID Panel     Status: None (Preliminary result)   Collection Time: 08/10/18  1:31 PM  Result Value Ref Range Status   Specimen Description BLOOD RIGHT ANTECUBITAL  Final   Special Requests   Final    BOTTLES DRAWN AEROBIC AND ANAEROBIC Blood Culture adequate volume   Culture   Final    NO GROWTH 4 DAYS Performed at [facility], [place] [address], [place], [place] [address]    Report Status PENDING  Incomplete    Microbiology: Recent Results (from the past 240 hour(s))  MRSA PCR Screening     Status: None   Collection Time: 08/07/18 12:12 PM  Result Value Ref Range Status   MRSA by PCR NEGATIVE NEGATIVE Final    Comment:        The GeneXpert MRSA Assay (FDA approved for NASAL specimens only), is one component of a comprehensive MRSA colonization  surveillance program. It is not intended to diagnose MRSA infection nor to guide or monitor treatment for MRSA infections. Performed at [facility], [place] [address], [place], [place] [address]   Culture, respiratory (non-expectorated)     Status: None   Collection Time: 08/07/18  2:30 PM  Result Value Ref Range Status   Specimen Description TRACHEAL ASPIRATE  Final   Special Requests NONE  Final   Gram Stain   Final    MODERATE WBC PRESENT, PREDOMINANTLY PMN NO ORGANISMS SEEN    Culture   Final    FEW DIPHTHEROIDS(CORYNEBACTERIUM  SPECIES) Standardized susceptibility testing for this organism is not available. Performed at [facility], [place] [address], [place], [place] [address]    Report Status 08/10/2018 FINAL  Final  Respiratory Panel by PCR     Status: None   Collection Time: 08/08/18  9:22 AM  Result Value Ref Range Status   Adenovirus NOT DETECTED NOT DETECTED Final   Coronavirus 229E NOT DETECTED NOT DETECTED Final    Comment: (NOTE) The Coronavirus on the Respiratory Panel, DOES NOT test for the novel  Coronavirus (2019 nCoV)    Coronavirus HKU1 NOT DETECTED NOT DETECTED Final   Coronavirus NL63 NOT DETECTED NOT DETECTED Final   Coronavirus OC43 NOT DETECTED NOT DETECTED Final   Metapneumovirus NOT DETECTED NOT DETECTED Final   Rhinovirus / Enterovirus NOT DETECTED NOT DETECTED Final   Influenza A NOT DETECTED NOT DETECTED Final   Influenza B NOT DETECTED NOT DETECTED Final   Parainfluenza Virus 1 NOT DETECTED NOT DETECTED Final   Parainfluenza Virus 2 NOT DETECTED NOT DETECTED Final   Parainfluenza Virus 3 NOT DETECTED NOT DETECTED Final   Parainfluenza Virus 4 NOT DETECTED NOT DETECTED Final   Respiratory Syncytial Virus NOT DETECTED NOT DETECTED Final   Bordetella pertussis NOT DETECTED NOT DETECTED Final   Chlamydophila pneumoniae NOT DETECTED NOT DETECTED Final   Mycoplasma pneumoniae NOT DETECTED NOT DETECTED Final    Comment: Performed at [facility], [address], [place], [place] [address]  Culture, blood (Routine X 2) w Reflex to ID Panel     Status: None (Preliminary result)   Collection Time: 08/10/18  1:31 PM  Result Value Ref Range Status   Specimen Description BLOOD BLOOD RIGHT HAND  Final   Special Requests   Final    BOTTLES DRAWN AEROBIC AND ANAEROBIC Blood Culture adequate volume   Culture   Final    NO GROWTH 4 DAYS Performed at [facility], [place] [address] [place], [place] [address]    Report Status PENDING  Incomplete  Culture, blood (Routine X 2) w  Reflex to ID Panel     Status: None (Preliminary result)   Collection Time: 08/10/18  1:31 PM  Result Value Ref Range Status   Specimen Description BLOOD RIGHT ANTECUBITAL  Final   Special Requests   Final    BOTTLES DRAWN AEROBIC AND ANAEROBIC Blood Culture adequate volume   Culture   Final    NO GROWTH 4 DAYS Performed at [facility], [place] [address] [place], [place] [address]    Report Status PENDING  Incomplete    Radiographs and labs were personally reviewed by me.   Bobby Rumpf, MD [facility] [phone] 08/15/2018, 11:51 AM

## 2018-08-15 NOTE — Progress Notes (Signed)
Patient has expressed no needs going home and wants to be discharged today does not want to sign AMA paper and agree to wait ID with in a reasonable amount of time.

## 2018-08-15 NOTE — Discharge Summary (Signed)
Physician Discharge Summary  ALLISON SILVA IHK:[id number] DOB: Apr 27, 1946 DOA: 08/05/2018  PCP: [name], MD  Admit date: 08/05/2018 Discharge date: 08/15/2018  Admitted From: Home Disposition: Home as he has refused [place] and SNF  Recommendations for Outpatient Follow-up:  1. Follow up with PCP in 1-2 weeks 2. Follow up with Cardiology Dr. [name] within 1 week 3. Follow up with Orthopedic Surgery within 1 week 4. Repeat CXR in 3-6 weeks 5. Please obtain CMP/CBC, Mag, Phos in one week 6. Please follow up on the following pending results:  Home Health: No as he refused  Equipment/Devices: None  Discharge Condition: Stable  CODE STATUS: FULL CODE Diet recommendation: Heart Healthy Carb Modified Diet   Brief/Interim Summary: The patient is ana 73y. o B M with a PMH CAD s/p stent placement, diastolic CHF, pulm HTN, DM type 2, CKD III along with other comorbidites who presented to the ED 3/19 with with fever up to 104.9, generalized weakness, chills and a dry cough within the 24 hours pta. He denied headache, chest pain, abdominal pain, cough, shortness of breath, vomiting, diarrhea, or rash. However, the COVID screen did report a dry cough within the 12 hours pta. There were no known sick contacts or recent travel; did have contact with individuals traveling from [place]. Did have a flu vaccination this year.  In the ED he was treated for presumptive SIRS and initated on Rocephin, Zithromax and vanc 2 g x 1. Procalcitonin was 1.28 on adm and had risen to 59.31 (3/21). WBC had risen from 15.4 on admission to 31.9 . Initial PCXR was somewhat underinflated,with interstitial infiltrates which worsened on subsequent films. He was Intubated for ARDS on 08/07/2018 and then treated and extubated on 3/25. COVID-19 SARS Co-V2 was Negative. He was Transitioned to [facility] on 08/13/2018 and had been refusing all interventions and threatening to leave AMA. Yesterday Complaining  of Right Arm/Wrist Pain so CT of Wrist was ordered which was consistent with CPPD Arthopathy. Orthopoedic Surgery evaluated and tried to aspirate but nothing came out. He was placed on po Steroids after his Leukocytosis started worsening.   Because of his leukocytosis worsening prior to steroid initiation infectious diseases was consulted and patient was placed back on empiric antibiotics with IV ceftriaxone for given concern of strep viridans bacteremia.  Dr. Johnnye Sima of infectious diseases evaluated the patient and did not feel he had a systemic underlying illness causing his leukocytosis and the etiology was unclear would suggest that is a flare of his CPPD or also due to adverse drug reaction.  Dr. Johnnye Sima recommended stopping ceftriaxone.  Patient was evaluated by physical therapy and they recommended skilled nursing facility and patient refused both skilled nursing facility as well as home health services and was deemed stable to be discharged home.  He had been threatening to leave [place] for last few days and almost left AMA infectious diseases seeing him.  He will need to follow-up with PCP, cardiology, orthopedic surgery in the outpatient setting as he is stable to D/C home.   Discharge Diagnoses:  Principal Problem:   Multifocal pneumonia Active Problems:   Insulin-requiring or dependent type II diabetes mellitus ([place])   Acute on chronic respiratory failure with hypoxia (HCC)   Chronic heart failure with preserved ejection fraction (HFpEF) (HCC)   Bilateral carotid artery stenosis   Coronary artery disease involving native coronary artery of native heart without angina pectoris   AKI (acute kidney injury) ([place])   Pain and  swelling of wrist, right  ARDS from Multifocal PNA in the setting of Strep Viridans Bacteremia  -Suspected due to CAP, consider ALI due to S. Viridans bacteremia -COVID-19 negative  -Had Pulmonary vascular congestion on CXR; Repeat CXR this AM showed  "Stable top-normal heart size. Low bilateral lung volumes. Mild residual interstitial prominence without overt edema, focal airspace consolidation, pleural fluid or pneumothorax." -Extubated 08/11/2018 -Continue diuresis and is now on Torsemide -Transferred to [place] on 08/13/2018 -C/w Albuterol 2.5 mg Neb q6hprn Wheezing and SOB and added Levalbuteropl/Ipratropium q6h but patient did well and did not need them -Also added guaifenesin 12 mg p.o. twice daily -Will need Ambulatory Home O2 Screen Prior to D/C and refused -PT/OT recommending SNF but patient declined SNF as well as home Health Services -Follow up with PCP at D/C   Community-acquired pneumonia -Flu negative, RVP negative, novel CV Negaice. Ur strep Ag negative -Procalcitonin improved significantly  -Completed 7-day course of antimicrobial therapy -Nebs As above -C/w Flutter valve and Incentive Spirometry  -Repeat CXR this AM showed Mild chronic interstitial markings, favoring chronic interstitial lung disease.Mild bibasilar opacities, possibly atelectasis -Resumed Nebs but patient did not need them  -Will need Repeat CXR in 3-6 weeks  Viridans strep bacteremia.  -Originally thought to be Contaminant (1/2 Bottles) vs true pathogen but it is a True Pathogen per ID  -TTE 3/22 no evidence endocarditis.  -3/24 BCx no growth at 5 days -Completed antimicrobials but restarted IV Ceftriaxone at the recommendation of ID for 5 More days of Treatment  -Complaining of Right Wrist Pain so will have Orthopedics Tap wrist and send for analysis but unable to get any fluid  -ID Consulted and recommended Stopping Abx -PCT was 0.36 now -Repeat CBC in the outpatient setting   AKI on CKD Stage 3 -Peak creatinine 2.78 on 3/20; Baseline 1.5; -BUN/Cr improved to 12/1.74 and is now 15/1.88 -Resumed home diuretic  -Continue to Monitor Cr in the out patient setting   Type II DM.  -C/w Sensitive Novolog Sliding scale insulin protocol  AC -CBG's ranging from 135-265 -Resumed Home Levemir today -Resume Home Blood Sugar Medications at D/C -Blood Sugar worsened and Elevated in the setting of Prednisone use -Continue to Adjust Insulin as Necessary and follow up with PCP   Macrocytic Anemia -Vitamin B12 reassuring -Folate level 8.8 -Patient's Hb/Hct has been dropping for the last few days and went from 8.1/25.7 -> 7.9/24.4 -> 7.7/24.1 -> 7.6/24.5  now it is stable at 7.8/25.2 -Continue to Monitor for S/Sx of Bleeding -Repeat CBC as an outpatient   Non-ST elevation MI -Troponin peak at 1.37 (3/22) -Echocardiogram reassuring -Likely in the setting of Demand Ischemia -S/p Heparin gtt -C/w ASA 81 mg po Daily, Atorvastatin 80 mg po Daily, Metoprolol Tartrate 25 mg po BID -Follow up with Cardiology Dr. [name] at D/C  HTN  -C/w Home Antihypertensives with Amlodipine 5 mg po Daily, Metoprolol Tartrate 25 mg po BID, Isosorbide-Hydralazine 20-37.5 1 tab po TID and Torsemide 10 mg po Daily  -BP is now P379/02  Chronic Diastolic CHF -Resumed home diuretics and hypertensive -Strict I's/O's and Daily Weights -C/w Metoprolol Tartrate 25 mg po BID, Isosorbide-Hydralazine 20-37.5 1 tab po TID and Torsemide 10 mg po Daily  -Patient is +1.742 Liters since admission and Weight is down 11 lbs and not done today  -Continue to Monitor Volume Status Carefully  -Follow up with Cardiology in the outpatient setting   Acute Encephalopathy, improved  -Related to sedation vs. Active Infection, now improved -  Improved but patient was agitated and refusing all interventions threatening to leave AMA yesterday. 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 -Will need outpatient management for it with PCP   Leukocytosis, worsened -Patient's WBC was improving but started to worsen an  went from 12.6 -> 14.1 -> 14.7 -> 16.2 -> 17.0 -> 19.5  -s/p Abx Course but resumed Ceftriaxone 2 grams q24h for  Bacteremia and now stopped per ID Recc's -Repeat Blood Cx from 08/10/2018 showed NGTD at 4 Days -Repeat Urinalysis Negative  -Repeat Procalcitonin Level trended down to 0.36 -Continue to Monitor for S/Sx of Infection -Infectious Diseases Consulted and they don't feel he has a systemic infection and feel his Leukocytosis is related to CPPD or Adverse Drug Reaction -Will Stop Abx per ID Reccs -Repeat CBC with 1 week to evaluate WBC count   Right Wrist Pain -X-Ray showed Chondrocalcinosis. Severe joint space loss and subchondral sclerosis at the lunate-triquetrum articulation. Distal radius and ulna are intact. No carpal bone fracture identified. Visible metacarpals appear intact. No discrete soft tissue injury -CT of Wrist showed Chondrocalcinosis with some erosions, possibly CPPD arthropathy. No appreciable fracture. -Will start Abx for ? Seeding from Streptococcus Viridans  -Consult Orthopedics for evaluation and will have them Tap wrist but unable to tap and get anything -Will also start the patient on Prednisone 40 mg x5 days total   HypoKalemia -Patient's potassium this morning was 4.4 -Continue monitor replete as necessary -Repeat CMP in a.m.  Abnormal LFTs, slightly worsened  -LFTs are starting to slightly elevate and slightly worsened  -AST went from 57 -> 65 and ALT went from 48 -> 65 -Obtain Acute Hepatitis panel and right upper quadrant ultrasound; Patient refused Hepatitis Blood Draw -Right upper quadrant ultrasound showed No definite abnormality seen in the right upper quadrant of the abdomen -Continue to monitor and trend hepatic function in the outpatient setting with PCP  HypoNatremia -Patient's sodium was 133 -And dropped from 139 likely due to resumption of diuretics -Mildly continue to monitor repeat CMP in the outpatient setting  Discharge Instructions  Discharge Instructions    (HEART FAILURE PATIENTS) Call MD:  Anytime you have any of the following symptoms:  1) 3 pound weight gain in 24 hours or 5 pounds in 1 week 2) shortness of breath, with or without a dry hacking cough 3) swelling in the hands, feet or stomach 4) if you have to sleep on extra pillows at night in order to breathe.   Complete by:  As directed    Call MD for:  difficulty breathing, headache or visual disturbances   Complete by:  As directed    Call MD for:  extreme fatigue   Complete by:  As directed    Call MD for:  hives   Complete by:  As directed    Call MD for:  persistant dizziness or light-headedness   Complete by:  As directed    Call MD for:  persistant nausea and vomiting   Complete by:  As directed    Call MD for:  redness, tenderness, or signs of infection (pain, swelling, redness, odor or green/yellow discharge around incision site)   Complete by:  As directed    Call MD for:  severe uncontrolled pain   Complete by:  As directed    Call MD for:  temperature >100.4   Complete by:  As directed    Diet - low sodium heart healthy  Complete by:  As directed    Diet Carb Modified   Complete by:  As directed    Discharge instructions   Complete by:  As directed    You were cared for by a hospitalist during your hospital stay. If you have any questions about your discharge medications or the care you received while you were in the hospital after you are discharged, you can call the unit and ask to speak with the hospitalist on call if the hospitalist that took care of you is not available. Once you are discharged, your primary care physician will handle any further medical issues. Please note that NO REFILLS for any discharge medications will be authorized once you are discharged, as it is imperative that you return to your primary care physician (or establish a relationship with a primary care physician if you do not have one) for your aftercare needs so that they can reassess your need for medications and monitor your lab values.  Follow up with PCP, Cardiology,  Pulmonary within 1 week. Take all medications as prescribed. If symptoms change or worsen please return to the ED for evaluation   Increase activity slowly   Complete by:  As directed      Allergies as of 08/15/2018   No Known Allergies     Medication List    TAKE these medications   acetaminophen 325 MG tablet Commonly known as:  TYLENOL Take 2 tablets (650 mg total) by mouth every 6 (six) hours as needed for mild pain or moderate pain (or Fever >/= 101).   albuterol 108 (90 Base) MCG/ACT inhaler Commonly known as:  PROVENTIL HFA;VENTOLIN HFA Inhale 2 puffs into the lungs every 6 (six) hours as needed for wheezing or shortness of breath.   ALPRAZolam 1 MG tablet Commonly known as:  XANAX Take 1 tablet (1 mg total) by mouth 2 (two) times daily as needed for anxiety.   amLODipine 10 MG tablet Commonly known as:  NORVASC Take 1 tablet (10 mg total) by mouth daily.   aspirin EC 81 MG tablet Take 81 mg by mouth daily.   atorvastatin 80 MG tablet Commonly known as:  LIPITOR Take 1 tablet (80 mg total) by mouth at bedtime.   dicyclomine 10 MG capsule Commonly known as:  BENTYL Take 1 capsule (10 mg total) by mouth 4 (four) times daily -  before meals and at bedtime.   dorzolamide-timolol 22.3-6.8 MG/ML ophthalmic solution Commonly known as:  COSOPT Place 1 drop into both eyes 2 (two) times daily.   Ensure Max Protein Liqd Take 330 mLs (11 oz total) by mouth 2 (two) times daily.   ferrous sulfate 325 (65 FE) MG tablet Take 325 mg by mouth daily with breakfast.   fluticasone 50 MCG/ACT nasal spray Commonly known as:  FLONASE Place 1 spray into both nostrils daily.   gabapentin 600 MG tablet Commonly known as:  NEURONTIN Take 600 mg by mouth 2 (two) times daily.   guaiFENesin 600 MG 12 hr tablet Commonly known as:  MUCINEX Take 1 tablet (600 mg total) by mouth 2 (two) times daily.   HumaLOG KwikPen 100 UNIT/ML KwikPen Generic drug:  insulin lispro Inject 4 Units  into the skin 3 (three) times daily.   HYDROcodone-acetaminophen 10-325 MG tablet Commonly known as:  NORCO Take 1 tablet by mouth every 6 (six) hours as needed for moderate pain.   Insulin Detemir 100 UNIT/ML Pen Commonly known as:  Levemir FlexTouch Inject 20 Units into the  skin daily.   isosorbide-hydrALAZINE 20-37.5 MG tablet Commonly known as:  BIDIL Take 1 tablet by mouth 3 (three) times daily. What changed:  how much to take   methocarbamol 750 MG tablet Commonly known as:  ROBAXIN Take 1 tablet (750 mg total) by mouth every 6 (six) hours as needed for muscle spasms.   metoprolol succinate 100 MG 24 hr tablet Commonly known as:  TOPROL-XL TAKE 1 TABLET BY MOUTH EVERY DAY   multivitamin with minerals Tabs tablet Take 1 tablet by mouth daily. Start taking on:  August 16, 2018   mupirocin ointment 2 % Commonly known as:  Bactroban Apply 1 application topically daily.   predniSONE 20 MG tablet Commonly known as:  DELTASONE Take 2 tablets (40 mg total) by mouth daily with breakfast. Start taking on:  August 16, 2018   saccharomyces boulardii 250 MG capsule Commonly known as:  FLORASTOR Take 1 capsule (250 mg total) by mouth 2 (two) times daily.   torsemide 20 MG tablet Commonly known as:  DEMADEX Take 10 mg by mouth daily.       No Known Allergies  Consultations:  PCCM Transfer   Infectious Diseases   Orthopedics Dr. Erlinda Hong  SIGNIFICANT EVENTS 3/21 tr to ICU & intubated, failed bipap 3/22 heparin gtt for trop pos 3/22 bradycardia with propofol, Versed drip started 3/25 COVID-19 NEGATIVE, extubated   STUDIES:  Post intubation PCXR - bilateral infiltrates c/w ARDS Influenza A/B - neg TTE 3/22 >>low normal systolic function 59-56%, concentric LVH. Unable to truly assess diastolic function, right heart function  CXR>LLL opacity   Procedures/Studies: Dg Wrist 2 Views Right  Result Date: 08/12/2018 CLINICAL DATA:  73 year old male with pain after  blunt trauma. EXAM: RIGHT WRIST - 2 VIEW COMPARISON:  None. FINDINGS: Chondrocalcinosis. Severe joint space loss and subchondral sclerosis at the lunate-triquetrum articulation. Distal radius and ulna are intact. No carpal bone fracture identified. Visible metacarpals appear intact. No discrete soft tissue injury. IMPRESSION: Degenerative changes.  No acute osseous abnormality identified. Electronically Signed   By: Genevie Ann M.D.   On: 08/12/2018 00:32   Dg Abd 1 View  Result Date: 08/07/2018 CLINICAL DATA:  OG tube placement. EXAM: ABDOMEN - 1 VIEW COMPARISON:  None. FINDINGS: OG tube appears well positioned with tip at the level of the pylorus or proximal duodenum. IMPRESSION: OG tube well positioned with tip at the level of the pylorus or proximal duodenum. Electronically Signed   By: Franki Cabot M.D.   On: 08/07/2018 12:57   Ct Wrist Right Wo Contrast  Result Date: 08/14/2018 CLINICAL DATA:  Wrist injury and wrist pain. EXAM: CT OF THE RIGHT WRIST WITHOUT CONTRAST TECHNIQUE: Multidetector CT imaging of the right wrist was performed according to the standard protocol. Multiplanar CT image reconstructions were also generated. COMPARISON:  08/12/2018 FINDINGS: Bones/Joint/Cartilage Chondrocalcinosis observed notable the margins of the radiocarpal joint and along the scapholunate ligament region. There is chondrocalcinosis of the TFCC disc. Prominent loss of articular space the lunatotriquetral articulation. There is also degenerative loss of articular space in various portions the carpus, for example between the trapezium and trapezoid. Suspected erosion along the dorsal lunate on image 180/208. No fracture is identified. Ligaments Suboptimally assessed by CT. Muscles and Tendons Grossly unremarkable Soft tissues Grossly unremarkable IMPRESSION: 1. Chondrocalcinosis with some erosions, possibly CPPD arthropathy. 2. No appreciable fracture. Electronically Signed   By: Van Clines M.D.   On:  08/14/2018 11:36   Dg Chest Port 1 View  Result Date:  08/15/2018 CLINICAL DATA:  Shortness of breath EXAM: PORTABLE CHEST 1 VIEW COMPARISON:  08/14/2018 FINDINGS: Faint chronic interstitial markings in the subpleural lungs bilaterally, left greater than right. No focal consolidation. No pleural effusion or pneumothorax. Cardiomegaly.  Coronary atherosclerosis. IMPRESSION: No evidence of acute cardiopulmonary disease. Electronically Signed   By: Julian Hy M.D.   On: 08/15/2018 08:09   Dg Chest Port 1 View  Result Date: 08/14/2018 CLINICAL DATA:  Shortness of breath EXAM: PORTABLE CHEST 1 VIEW COMPARISON:  08/13/2018 FINDINGS: Mild increased interstitial markings, chronic. Mild bibasilar opacities. No pleural effusion or pneumothorax. Cardiomegaly. IMPRESSION: Mild chronic interstitial markings, favoring chronic interstitial lung disease. Mild bibasilar opacities, possibly atelectasis. Electronically Signed   By: Julian Hy M.D.   On: 08/14/2018 07:37   Dg Chest Port 1 View  Result Date: 08/13/2018 CLINICAL DATA:  Pneumonia, respiratory failure and ARDS. EXAM: PORTABLE CHEST 1 VIEW COMPARISON:  08/12/2018 FINDINGS: Stable top-normal heart size. Low bilateral lung volumes. Mild residual interstitial prominence without overt edema, focal airspace consolidation, pleural fluid or pneumothorax. IMPRESSION: Low bilateral lung volumes.  Mild residual interstitial prominence. Electronically Signed   By: Aletta Edouard M.D.   On: 08/13/2018 08:38   Dg Chest Port 1 View  Result Date: 08/12/2018 CLINICAL DATA:  Pneumonia EXAM: PORTABLE CHEST 1 VIEW COMPARISON:  07/22/2018 FINDINGS: Cardiac enlargement. Left coronary stent. Endotracheal tube and NG tube removed. Improvement in bilateral airspace disease. Improved aeration of the lungs. Mild residual interstitial airspace disease. IMPRESSION: Interval improvement in bibasilar airspace disease. Endotracheal tube and NG tube removed. Electronically  Signed   By: Franchot Gallo M.D.   On: 08/12/2018 08:15   Dg Chest Port 1 View  Result Date: 08/11/2018 CLINICAL DATA:  Acute on chronic respiratory failure with hypoxia EXAM: PORTABLE CHEST 1 VIEW COMPARISON:  Yesterday FINDINGS: Endotracheal tube tip just below the clavicular heads. The orogastric tube reaches the stomach. Low volume chest with airspace opacity primarily at the left base. Cardiomegaly. Coronary stenting. No effusion or pneumothorax. IMPRESSION: 1. Pneumonia with stable asymmetric left base opacification. 2. Stable hardware positioning. Electronically Signed   By: Monte Fantasia M.D.   On: 08/11/2018 07:47   Dg Chest Port 1 View  Result Date: 08/10/2018 CLINICAL DATA:  Hypoxia EXAM: PORTABLE CHEST 1 VIEW COMPARISON:  08/09/2018 FINDINGS: Cardiac shadow is stable. Endotracheal tube and gastric catheter are again seen and stable. Coronary stenting is noted. The lungs are well aerated bilaterally. Persistent bibasilar infiltrate left greater than right is noted. No new focal abnormality is seen. IMPRESSION: Stable bibasilar infiltrates left greater than right. Tubes and lines in satisfactory position. Electronically Signed   By: Inez Catalina M.D.   On: 08/10/2018 07:03   Dg Chest Port 1 View  Result Date: 08/09/2018 CLINICAL DATA:  Acute respiratory failure EXAM: PORTABLE CHEST 1 VIEW COMPARISON:  Yesterday FINDINGS: Endotracheal tube tip just below the clavicular heads. The orogastric tube reaches the stomach. Cardiomegaly. Coronary stenting. Haziness of the bilateral chest that is unchanged. No pneumothorax. IMPRESSION: 1. Stable hardware positioning. 2. History of pneumonia with stable bilateral lung opacity. Electronically Signed   By: Monte Fantasia M.D.   On: 08/09/2018 07:25   Dg Chest Port 1 View  Result Date: 08/08/2018 CLINICAL DATA:  73 year old male with history of acute respiratory distress syndrome. Possible COVID-19. EXAM: PORTABLE CHEST 1 VIEW COMPARISON:  Chest x-ray  08/07/2018. FINDINGS: An endotracheal tube is in place with tip 4.9 cm above the carina. A nasogastric tube is seen extending  into the stomach, however, the tip of the nasogastric tube extends below the lower margin of the image. Lung volumes remain low. There is significantly improved aeration throughout the lungs bilaterally compared with yesterday's examination. There continues to be patchy asymmetrically distributed hazy opacities throughout both lungs (right greater than left), with relative sparing of the left upper lobe, strongly favored to reflect resolving multilobar pneumonia. Probable small bilateral pleural effusions. No cephalization of the pulmonary vasculature to suggest pulmonary edema. Heart size is borderline enlarged. Upper mediastinal contours are within normal limits. Aortic atherosclerosis. IMPRESSION: 1. Support apparatus, as above. 2. Improved aeration throughout the lungs bilaterally, with an appearance most compatible with resolving multilobar pneumonia. Electronically Signed   By: Vinnie Langton M.D.   On: 08/08/2018 07:24   Dg Chest Port 1 View  Result Date: 08/07/2018 CLINICAL DATA:  Intubation of airway performed without difficulty EXAM: PORTABLE CHEST 1 VIEW COMPARISON:  Chest x-ray from earlier same day. FINDINGS: Endotracheal tube appears well positioned with tip approximately 4 cm above the carina. Increasing bilateral airspace opacities, pulmonary edema versus multifocal pneumonia. No pleural effusions seen. No pneumothorax seen. Heart size and mediastinal contours appear stable. No acute or suspicious osseous finding. IMPRESSION: 1. Endotracheal tube well positioned with tip approximately 4 cm above the carina. 2. Increasing bilateral airspace opacities, pulmonary edema versus multifocal pneumonia. Electronically Signed   By: Franki Cabot M.D.   On: 08/07/2018 12:57   Dg Chest Port 1 View  Result Date: 08/07/2018 CLINICAL DATA:  Pneumonia. EXAM: PORTABLE CHEST 1 VIEW  COMPARISON:  08/05/2018 FINDINGS: Lungs are adequately inflated demonstrate patchy hazy airspace opacification worse over the left midlung as there is slight interval worsening. Findings may be due to pneumonia versus asymmetric edema. Mild stable cardiomegaly. Remainder of the exam is unchanged. IMPRESSION: Slight interval worsening of hazy patchy bilateral airspace opacification worse over the left midlung as findings may be due to asymmetric edema versus pneumonia. Stable cardiomegaly. Electronically Signed   By: Marin Olp M.D.   On: 08/07/2018 09:01   Dg Chest Port 1 View  Result Date: 08/05/2018 CLINICAL DATA:  Fever EXAM: PORTABLE CHEST 1 VIEW COMPARISON:  01/15/2018 FINDINGS: Cardiomegaly with coronary stenting. Low volume chest. Fine interstitial opacity on both sides without Kerley lines, vascular pedicle widening, or effusion. No pneumothorax IMPRESSION: 1. Low volume chest with bilateral fine interstitial opacity. History of fever suggest bronchitis or interstitial pneumonia. 2. Chronic cardiomegaly. Electronically Signed   By: Monte Fantasia M.D.   On: 08/05/2018 04:37   Dg Abd Portable 1v  Result Date: 08/10/2018 CLINICAL DATA:  Evaluate new placement of OG tube. EXAM: PORTABLE ABDOMEN - 1 VIEW COMPARISON:  08/07/2018 FINDINGS: The tip of an orogastric tube is seen crossing midline into the right upper quadrant of the abdomen possibly in the expected location of the gastric antrum/gastric pylorus or duodenal bulb. Mild dilatation of included small bowel loops. Cardiomegaly with patchy airspace disease, slightly improved at the right base is noted. IMPRESSION: 1. Satisfactory orogastric tube position. 2. Cardiomegaly patchy airspace disease left greater than right, improved at the right lung base since prior. Electronically Signed   By: Ashley Royalty M.D.   On: 08/10/2018 20:46   Pcv Carotid Duplex (bilateral)  Result Date: 07/24/2018 Carotid artery duplex  07/20/2018 Stenosis in the right  internal carotid artery (16-49%). Severe stenosis in the left internal carotid artery (50-69%). The left PSV internal/common carotid artery ratio of 4.0 is consistent with a stenosis of >70%. Stenosis in the  left external carotid artery (>50%). Antegrade right vertebral artery flow. Antegrade left vertebral artery flow. Compared to 12/29/2017, left ICA stenosis appears to have slightly progressed. Follow up in six months is appropriate if clinically indicated.  US Abdomen Limited Ruq  Result Date: 08/14/2018 CLINICAL DATA:  Abnormal liver function tests. EXAM: ULTRASOUND ABDOMEN LIMITED RIGHT UPPER QUADRANT COMPARISON:  CT scan of December 03, 2017. FINDINGS: Gallbladder: No gallstones or wall thickening visualized. No sonographic Murphy sign noted by sonographer. Common bile duct: Diameter: 4 mm which is within normal limits. Liver: No focal lesion identified. Within normal limits in parenchymal echogenicity. Portal vein is patent on color Doppler imaging with normal direction of blood flow towards the liver. IMPRESSION: No definite abnormality seen in the right upper quadrant of the abdomen. Electronically Signed   By: Marijo Conception, M.D.   On: 08/14/2018 09:46    ECHOCARDIOGRAM 08/08/2018 IMPRESSIONS    1. The left ventricle has low normal systolic function, with an ejection fraction of 50-55%. There is concentric left ventricular hypertrophy. No evidence of left ventricular regional wall motion abnormalities. Diastolic function not assessed.  2. The right ventricle has normal systolc function. There is no increase in right ventricular wall thickness.  3. Mild tricuspid regurgitation. This is a limited study and inadequate to assess RV systolic pressure.  4. Unlike previous study in 12/2017, pulmonary hypertension and diastolic dysfunction not adequately evaluated in this limited study. No other significant change noted.  FINDINGS  Left Ventricle: The left ventricle has low normal systolic  function, with an ejection fraction of 50-55%. There is concentric left ventricular hypertrophy. No evidence of left ventricular regional wall motion abnormalities. Right Ventricle: The right ventricle has normal systolic function. There is no increase in right ventricular wall thickness. Mitral Valve: Mild thickening of the mitral valve leaflet. Mitral valve regurgitation is not visualized by color flow Doppler. Tricuspid Valve: The tricuspid valve was normal in structure. Tricuspid valve regurgitation is mild by color flow Doppler. Aortic Valve: Mild thickening of the aortic valve. Aortic valve regurgitation was not visualized by color flow Doppler.   LEFT VENTRICLE PLAX 2D LVIDd:         5.60 cm LV PW:         1.00 cm LV IVS:        1.20 cm    AORTA Ao Root diam: 3.30 cm Ao Asc diam:  3.70 cm  Subjective: Seen And examined at bedside he was agitated and wanted to go home.  I was left AGAINST MEDICAL ADVICE but waited for infectious disease doctor to evaluate him.  No chest pain, lightheadedness or dizziness.  Frustrated that he is in the hospital this long and refusing skilled nursing facility and home health services.  Remains significantly weak and and it is difficult for him to ambulate but he will be going home follow-up with his primary care physician within 1 week.  Discharge Exam: Vitals:   08/15/18 0434 08/15/18 0920  BP: (!) 145/97 (!) 154/76  Pulse: 81 97  Resp: 20   Temp: 98 F (36.7 C)   SpO2: 97% 99%   Vitals:   08/14/18 1946 08/14/18 2153 08/15/18 0434 08/15/18 0920  BP: (!) 143/64 (!) 156/74 (!) 145/97 (!) 154/76  Pulse: 80 79 81 97  Resp: 20  20   Temp: (!) 97.4 F (36.3 C)  98 F (36.7 C)   TempSrc: Oral  Oral   SpO2: 98%  97% 99%  Weight:  Height:       General: Pt is alert, awake, not in acute distress Cardiovascular: RRR, S1/S2 +, no rubs, no gallops Respiratory: Diminished to auscultation bilaterally, no wheezing, no rhonchi; Unlabored  breathing  Abdominal: Soft, NT, Distended slightly, bowel sounds + Extremities: Trace LE edema, no cyanosis; Right wrist no longer bandaged and not as painful  The results of significant diagnostics from this hospitalization (including imaging, microbiology, ancillary and laboratory) are listed below for reference.    Microbiology: Recent Results (from the past 240 hour(s))  MRSA PCR Screening     Status: None   Collection Time: 08/07/18 12:12 PM  Result Value Ref Range Status   MRSA by PCR NEGATIVE NEGATIVE Final    Comment:        The GeneXpert MRSA Assay (FDA approved for NASAL specimens only), is one component of a comprehensive MRSA colonization surveillance program. It is not intended to diagnose MRSA infection nor to guide or monitor treatment for MRSA infections. Performed at [facility], [place] [address], [place], [place] [address]   Culture, respiratory (non-expectorated)     Status: None   Collection Time: 08/07/18  2:30 PM  Result Value Ref Range Status   Specimen Description TRACHEAL ASPIRATE  Final   Special Requests NONE  Final   Gram Stain   Final    MODERATE WBC PRESENT, PREDOMINANTLY PMN NO ORGANISMS SEEN    Culture   Final    FEW DIPHTHEROIDS(CORYNEBACTERIUM SPECIES) Standardized susceptibility testing for this organism is not available. Performed at [facility], [place] [address], [place], [place] [address]    Report Status 08/10/2018 FINAL  Final  Respiratory Panel by PCR     Status: None   Collection Time: 08/08/18  9:22 AM  Result Value Ref Range Status   Adenovirus NOT DETECTED NOT DETECTED Final   Coronavirus 229E NOT DETECTED NOT DETECTED Final    Comment: (NOTE) The Coronavirus on the Respiratory Panel, DOES NOT test for the novel  Coronavirus (2019 nCoV)    Coronavirus HKU1 NOT DETECTED NOT DETECTED Final   Coronavirus NL63 NOT DETECTED NOT DETECTED Final   Coronavirus OC43 NOT DETECTED NOT DETECTED Final   Metapneumovirus NOT  DETECTED NOT DETECTED Final   Rhinovirus / Enterovirus NOT DETECTED NOT DETECTED Final   Influenza A NOT DETECTED NOT DETECTED Final   Influenza B NOT DETECTED NOT DETECTED Final   Parainfluenza Virus 1 NOT DETECTED NOT DETECTED Final   Parainfluenza Virus 2 NOT DETECTED NOT DETECTED Final   Parainfluenza Virus 3 NOT DETECTED NOT DETECTED Final   Parainfluenza Virus 4 NOT DETECTED NOT DETECTED Final   Respiratory Syncytial Virus NOT DETECTED NOT DETECTED Final   Bordetella pertussis NOT DETECTED NOT DETECTED Final   Chlamydophila pneumoniae NOT DETECTED NOT DETECTED Final   Mycoplasma pneumoniae NOT DETECTED NOT DETECTED Final    Comment: Performed at [facility], [address] [place], [place] [address]  Culture, blood (Routine X 2) w Reflex to ID Panel     Status: None (Preliminary result)   Collection Time: 08/10/18  1:31 PM  Result Value Ref Range Status   Specimen Description BLOOD BLOOD RIGHT HAND  Final   Special Requests   Final    BOTTLES DRAWN AEROBIC AND ANAEROBIC Blood Culture adequate volume   Culture   Final    NO GROWTH 4 DAYS Performed at [facility], [place] [address], [place], [place] [address]    Report Status  PENDING  Incomplete  Culture, blood (Routine X 2) w Reflex to ID Panel     Status: None (Preliminary result)   Collection Time: 08/10/18  1:31 PM  Result Value Ref Range Status   Specimen Description BLOOD RIGHT ANTECUBITAL  Final   Special Requests   Final    BOTTLES DRAWN AEROBIC AND ANAEROBIC Blood Culture adequate volume   Culture   Final    NO GROWTH 4 DAYS Performed at [facility], [address] [place], [place] [address]    Report Status PENDING  Incomplete    Labs: BNP (last 3 results) Recent Labs    12/19/17 0902 08/07/18 1105  BNP 458.5* 408.1*   Basic Metabolic Panel: Recent Labs  Lab 08/09/18 0447 08/10/18 0629 08/11/18 0500 08/12/18 0239 08/14/18 0350 08/15/18 0317  NA 141 141 141 142 139 133*  K 3.6  3.1* 3.7 3.5 3.4* 4.4  CL 112* 107 108 109 104 101  CO2 19* 18* 21* 22 23 23   GLUCOSE 144* 140* 212* 152* 214* 350*  BUN 19 11 12 10 12 15   CREATININE 1.73* 1.56* 1.74* 1.83* 1.74* 1.88*  CALCIUM 8.1* 8.4* 8.3* 8.4* 8.4* 8.4*  MG 1.9 1.8 1.7  --  1.8 1.9  PHOS 3.1  --   --   --  3.6 3.6   Liver Function Tests: Recent Labs  Lab 08/14/18 0350 08/15/18 0317  AST 57* 65*  ALT 48* 65*  ALKPHOS 187* 197*  BILITOT 1.1 1.0  PROT 7.6 7.5  ALBUMIN 2.4* 2.5*   No results for input(s): LIPASE, AMYLASE in the last 168 hours. No results for input(s): AMMONIA in the last 168 hours. CBC: Recent Labs  Lab 08/09/18 0447 08/10/18 0629 08/11/18 0500 08/12/18 0239 08/14/18 0350 08/15/18 0317  WBC 12.6* 14.1* 14.7* 16.2* 17.0* 19.5*  NEUTROABS 10.1*  --   --   --  12.9* 18.0*  HGB 8.2* 8.1* 7.9* 7.7* 7.6* 7.8*  HCT 25.5* 25.7* 24.4* 24.1* 24.5* 25.2*  MCV 107.1* 107.1* 107.5* 107.6* 106.5* 107.2*  PLT 257 276 307 285 304 315   Cardiac Enzymes: Recent Labs  Lab 08/09/18 0447  TROPONINI 0.38*   BNP: Invalid input(s): POCBNP CBG: Recent Labs  Lab 08/14/18 1139 08/14/18 1641 08/14/18 2108 08/15/18 0821 08/15/18 1135  GLUCAP 268* 285* 292* 297* 447*   D-Dimer No results for input(s): DDIMER in the last 72 hours. Hgb A1c No results for input(s): HGBA1C in the last 72 hours. Lipid Profile No results for input(s): CHOL, HDL, LDLCALC, TRIG, CHOLHDL, LDLDIRECT in the last 72 hours. Thyroid function studies No results for input(s): TSH, T4TOTAL, T3FREE, THYROIDAB in the last 72 hours.  Invalid input(s): FREET3 Anemia work up No results for input(s): VITAMINB12, FOLATE, FERRITIN, TIBC, IRON, RETICCTPCT in the last 72 hours. Urinalysis    Component Value Date/Time   COLORURINE YELLOW 08/15/2018 0911   APPEARANCEUR HAZY (A) 08/15/2018 0911   LABSPEC 1.021 08/15/2018 0911   PHURINE 5.0 08/15/2018 0911   GLUCOSEU 150 (A) 08/15/2018 0911   HGBUR SMALL (A) 08/15/2018 0911    BILIRUBINUR NEGATIVE 08/15/2018 0911   KETONESUR 5 (A) 08/15/2018 0911   PROTEINUR 100 (A) 08/15/2018 0911   UROBILINOGEN 1.0 02/13/2015 1934   NITRITE NEGATIVE 08/15/2018 0911   LEUKOCYTESUR NEGATIVE 08/15/2018 0911   Sepsis Labs Invalid input(s): PROCALCITONIN,  WBC,  LACTICIDVEN Microbiology Recent Results (from the past 240 hour(s))  MRSA PCR Screening     Status: None   Collection Time: 08/07/18  12:12 PM  Result Value Ref Range Status   MRSA by PCR NEGATIVE NEGATIVE Final    Comment:        The GeneXpert MRSA Assay (FDA approved for NASAL specimens only), is one component of a comprehensive MRSA colonization surveillance program. It is not intended to diagnose MRSA infection nor to guide or monitor treatment for MRSA infections. Performed at [facility], [place] [address] [place], [place] [address]   Culture, respiratory (non-expectorated)     Status: None   Collection Time: 08/07/18  2:30 PM  Result Value Ref Range Status   Specimen Description TRACHEAL ASPIRATE  Final   Special Requests NONE  Final   Gram Stain   Final    MODERATE WBC PRESENT, PREDOMINANTLY PMN NO ORGANISMS SEEN    Culture   Final    FEW DIPHTHEROIDS(CORYNEBACTERIUM SPECIES) Standardized susceptibility testing for this organism is not available. Performed at [facility], [place] [address], [place], [place] [address]    Report Status 08/10/2018 FINAL  Final  Respiratory Panel by PCR     Status: None   Collection Time: 08/08/18  9:22 AM  Result Value Ref Range Status   Adenovirus NOT DETECTED NOT DETECTED Final   Coronavirus 229E NOT DETECTED NOT DETECTED Final    Comment: (NOTE) The Coronavirus on the Respiratory Panel, DOES NOT test for the novel  Coronavirus (2019 nCoV)    Coronavirus HKU1 NOT DETECTED NOT DETECTED Final   Coronavirus NL63 NOT DETECTED NOT DETECTED Final   Coronavirus OC43 NOT DETECTED NOT DETECTED Final   Metapneumovirus NOT DETECTED NOT DETECTED Final    Rhinovirus / Enterovirus NOT DETECTED NOT DETECTED Final   Influenza A NOT DETECTED NOT DETECTED Final   Influenza B NOT DETECTED NOT DETECTED Final   Parainfluenza Virus 1 NOT DETECTED NOT DETECTED Final   Parainfluenza Virus 2 NOT DETECTED NOT DETECTED Final   Parainfluenza Virus 3 NOT DETECTED NOT DETECTED Final   Parainfluenza Virus 4 NOT DETECTED NOT DETECTED Final   Respiratory Syncytial Virus NOT DETECTED NOT DETECTED Final   Bordetella pertussis NOT DETECTED NOT DETECTED Final   Chlamydophila pneumoniae NOT DETECTED NOT DETECTED Final   Mycoplasma pneumoniae NOT DETECTED NOT DETECTED Final    Comment: Performed at [facility], [address], [place], [place] [address]  Culture, blood (Routine X 2) w Reflex to ID Panel     Status: None (Preliminary result)   Collection Time: 08/10/18  1:31 PM  Result Value Ref Range Status   Specimen Description BLOOD BLOOD RIGHT HAND  Final   Special Requests   Final    BOTTLES DRAWN AEROBIC AND ANAEROBIC Blood Culture adequate volume   Culture   Final    NO GROWTH 4 DAYS Performed at [facility], [place] [address], [place], [place] [address]    Report Status PENDING  Incomplete  Culture, blood (Routine X 2) w Reflex to ID Panel     Status: None (Preliminary result)   Collection Time: 08/10/18  1:31 PM  Result Value Ref Range Status   Specimen Description BLOOD RIGHT ANTECUBITAL  Final   Special Requests   Final    BOTTLES DRAWN AEROBIC AND ANAEROBIC Blood Culture adequate volume   Culture   Final    NO GROWTH 4 DAYS Performed at [facility], [place] [address] [place], [place] [address]    Report Status PENDING  Incomplete   Time coordinating discharge: 35 minutes  SIGNED:  Kerney Elbe, DO [facility] 08/15/2018, 12:39 PM Pager is on [place]  If 7PM-7AM, please contact night-coverage [email or web address] Password [email or web address]

## 2018-08-15 NOTE — Progress Notes (Signed)
Patient has removed himself from telemetry.  And planning to leave [facility]a

## 2018-08-16 LAB — URINE CULTURE

## 2018-08-16 LAB — HEPATITIS PANEL, ACUTE
HCV AB: 0.1 {s_co_ratio} (ref 0.0–0.9)
HEP B S AG: NEGATIVE
Hep A IgM: NEGATIVE
Hep B C IgM: NEGATIVE

## 2018-08-24 ENCOUNTER — Telehealth: Payer: Self-pay

## 2018-08-24 NOTE — Telephone Encounter (Signed)
Pt's wife called and wanted to know if there was a change in his atorvastatin dose she said notes from discharge say 10, but she has always given him 80 mg.

## 2018-08-25 ENCOUNTER — Ambulatory Visit (INDEPENDENT_AMBULATORY_CARE_PROVIDER_SITE_OTHER): Payer: Medicare Other | Admitting: Family

## 2018-08-30 ENCOUNTER — Telehealth: Payer: Self-pay

## 2018-08-30 ENCOUNTER — Other Ambulatory Visit: Payer: Self-pay

## 2018-08-30 DIAGNOSIS — E782 Mixed hyperlipidemia: Secondary | ICD-10-CM

## 2018-08-30 DIAGNOSIS — R748 Abnormal levels of other serum enzymes: Secondary | ICD-10-CM

## 2018-08-30 MED ORDER — ATORVASTATIN CALCIUM 10 MG PO TABS: 10.0000 mg | ORAL_TABLET | Freq: Every day | ORAL | 1 refills | Status: DC

## 2018-08-30 NOTE — Telephone Encounter (Signed)
Per our conversation Atorvastatin dose decrease to 10 mg, pt to have blood work, you were going to put order in. I left a VM message for pt.

## 2018-09-07 ENCOUNTER — Telehealth: Payer: Self-pay

## 2018-09-07 NOTE — Telephone Encounter (Signed)
Please schedule a virtual visit. Will discuss at length. He did not have type 1 MI.

## 2018-09-07 NOTE — Telephone Encounter (Signed)
Pt wife called and stated pt has been nauseous for 2 days. Also she mentioned the PCP mentioned the  pt having had a heart attack in the hospital in march based on his labs. . Also pt is weak in his falling, his legs gave away..  Please advise.  Wife number [phone]

## 2018-09-10 ENCOUNTER — Other Ambulatory Visit: Payer: Self-pay

## 2018-09-10 ENCOUNTER — Encounter: Payer: Self-pay | Admitting: Cardiology

## 2018-09-10 ENCOUNTER — Ambulatory Visit (INDEPENDENT_AMBULATORY_CARE_PROVIDER_SITE_OTHER): Payer: Medicare Other | Admitting: Cardiology

## 2018-09-10 VITALS — BP 134/75 | HR 63 | Ht 74.0 in | Wt 230.0 lb

## 2018-09-10 DIAGNOSIS — I2510 Atherosclerotic heart disease of native coronary artery without angina pectoris: Secondary | ICD-10-CM

## 2018-09-10 DIAGNOSIS — D649 Anemia, unspecified: Secondary | ICD-10-CM | POA: Diagnosis not present

## 2018-09-10 DIAGNOSIS — R748 Abnormal levels of other serum enzymes: Secondary | ICD-10-CM

## 2018-09-10 DIAGNOSIS — N183 Chronic kidney disease, stage 3 unspecified: Secondary | ICD-10-CM

## 2018-09-10 DIAGNOSIS — N1832 Chronic kidney disease, stage 3b: Secondary | ICD-10-CM | POA: Insufficient documentation

## 2018-09-10 MED ORDER — AMLODIPINE BESYLATE 10 MG PO TABS: 5.0000 mg | ORAL_TABLET | Freq: Every day | ORAL | Status: DC

## 2018-09-10 MED ORDER — AMLODIPINE BESYLATE 10 MG PO TABS: 10.0000 mg | ORAL_TABLET | Freq: Every day | ORAL | Status: DC

## 2018-09-10 NOTE — Progress Notes (Signed)
Virtual Visit via Video Note   Subjective:   David [name], male    DOB: [birth year], 73 y.o.   MRN: [id number]   I connected with the patient on 09/10/18 by a video enabled telemedicine application and verified that I am speaking with the correct person using two identifiers.     I discussed the limitations of evaluation and management by telemedicine and the availability of in person appointments. The patient expressed understanding and agreed to proceed.   This visit type was conducted due to national recommendations for restrictions regarding the COVID-19 Pandemic (e.g. social distancing).  This format is felt to be most appropriate for this patient at this time.  All issues noted in this document were discussed and addressed.  No physical exam was performed (except for noted visual exam findings with Tele health visits).  The patient has consented to conduct a Tele health visit and understands insurance will be billed.      Chief complaint:  Leg swelling   HPI  73 year old African-American male with coronary artery disease status post complex LAD PCI 03/2017, HFpEF, pulmonary hypertension, type II diabetes mellitus, hypertension, CKD III.  Patient was admitted to [facility] ICU in 07/2018 with fever, cough, septic shock, hypoxic and hypercapnic respiratory failure and ARDS requiring mechanical ventilation. COVID 19 test was negative. He was trated with ceftriazone and azithromycin for multifocal pneumonia. During the hospitalization, his Troponin did increase to 1.37, likely type 2 MI in the setting of septic shock. His liver enzymes were mildly elevated to 50s and 60s. Lipitor dose was thus reduced on discharge. He also had AKI w/Cr up 2.3, improved to 1.8 on discharge.   Since his discharge, he is recovering well. He denies any chest pain, shortness of breath. He has noticed swelling in his legs. His weight is 230 lb, up from his office visit weight of 223 lb. He has  increased his torsemide to 20 mg one tab once daily from 1/2 tab once daily. He has not had any follow up labwork since his hospital discharge on 08/16/2018.   Past Medical History:  Diagnosis Date  . Arthritis   . CAD in native artery    s/p stent in 11/18  . CHF (congestive heart failure) ([place])    normal echo in 11/18  . CKD (chronic kidney disease)   . DDD (degenerative disc disease), cervical   . Diabetes mellitus with complication (HCC)    Type II  . Diabetic neuropathy ([place])   . Diabetic retinopathy ([place])   . GERD (gastroesophageal reflux disease)   . Glaucoma   . Hypertension      Past Surgical History:  Procedure Laterality Date  . AMPUTATION Right 07/09/2016   Procedure: Right Great Toe Amputation at Metatarsophalangeal Joint;  Surgeon: [name], MD;  Location: [facility];  Service: Orthopedics;  Laterality: Right;  . AMPUTATION Right 05/28/2018   Procedure: RIGHT SECOND TOE AMPUTATION;  Surgeon: [name], MD;  Location: [facility];  Service: Orthopedics;  Laterality: Right;  . BACK SURGERY     4  . CARDIAC CATHETERIZATION    . CORONARY STENT INTERVENTION N/A 04/06/2017   Procedure: CORONARY STENT INTERVENTION;  Surgeon: Nigel Mormon, MD;  Location: [facility];  Service: Cardiovascular;  Laterality: N/A;  . CORONARY STENT INTERVENTION N/A 04/07/2017   Procedure: CORONARY STENT INTERVENTION;  Surgeon: Nigel Mormon, MD;  Location: [facility];  Service: Cardiovascular;  Laterality: N/A;  .  CYST EXCISION     on Back  . ENDOTRACHEAL INTUBATION EMERGENT  08/07/2018      . JOINT REPLACEMENT    . LAPAROSCOPIC ABDOMINAL EXPLORATION N/A 11/26/2017   Procedure: LAPAROSCOPIC ABDOMINAL EXPLORATION, DRAINAGE OF APPENDICEAL ABCESS. PLACEMENT OF DRAIN;  Surgeon: Alphonsa Overall, MD;  Location: [facility];  Service: General;  Laterality: N/A;  . RIGHT/LEFT HEART CATH AND CORONARY ANGIOGRAPHY N/A 04/06/2017   Procedure: RIGHT/LEFT HEART CATH AND CORONARY  ANGIOGRAPHY;  Surgeon: Nigel Mormon, MD;  Location: [facility];  Service: Cardiovascular;  Laterality: N/A;  . TOE SURGERY  05/2018   Left   . TOTAL HIP ARTHROPLASTY     Right      Social History   Socioeconomic History  . Marital status: Married    Spouse name: Not on file  . Number of children: 2  . Years of education: 17  . Highest education level: Master's degree (e.g., MA, MS, MEng, MEd, MSW, MBA)  Occupational History  . Occupation: Retired  Scientific laboratory technician  . Financial resource strain: Not hard at all  . Food insecurity:    Worry: Not on file    Inability: Not on file  . Transportation needs:    Medical: Not on file    Non-medical: Not on file  Tobacco Use  . Smoking status: Never Smoker  . Smokeless tobacco: Never Used  Substance and Sexual Activity  . Alcohol use: Yes  . Drug use: No  . Sexual activity: Yes  Lifestyle  . Physical activity:    Days per week: 0 days    Minutes per session: 0 min  . Stress: Not at all  Relationships  . Social connections:    Talks on phone: Twice a week    Gets together: Twice a week    Attends religious service: 1 to 4 times per year    Active member of club or organization: Yes    Attends meetings of clubs or organizations: 1 to 4 times per year    Relationship status: Married  . Intimate partner violence:    Fear of current or ex partner: No    Emotionally abused: No    Physically abused: No    Forced sexual activity: No  Other Topics Concern  . Not on file  Social History Narrative  . Not on file     Family History  Problem Relation Age of Onset  . Diabetes Other   . Hyperlipidemia Other   . Hypertension Other   . Stroke Other   . Alzheimer's disease Other   . Thyroid disease Mother   . Diabetes Mellitus II Father   . Alzheimer's disease Father      Current Outpatient Medications on File Prior to Visit  Medication Sig Dispense Refill  . acetaminophen (TYLENOL) 325 MG tablet Take 2 tablets  (650 mg total) by mouth every 6 (six) hours as needed for mild pain or moderate pain (or Fever >/= 101).    Marland Kitchen albuterol (PROVENTIL HFA;VENTOLIN HFA) 108 (90 Base) MCG/ACT inhaler Inhale 2 puffs into the lungs every 6 (six) hours as needed for wheezing or shortness of breath. 1 Inhaler 2  . ALPRAZolam (XANAX) 1 MG tablet Take 1 tablet (1 mg total) by mouth 2 (two) times daily as needed for anxiety. 6 tablet 0  . amLODipine (NORVASC) 10 MG tablet Take 1 tablet (10 mg total) by mouth daily.    Marland Kitchen aspirin EC 81 MG tablet Take 81 mg by mouth  daily.    . atorvastatin (LIPITOR) 10 MG tablet Take 1 tablet (10 mg total) by mouth daily. 90 tablet 1  . dicyclomine (BENTYL) 10 MG capsule Take 1 capsule (10 mg total) by mouth 4 (four) times daily -  before meals and at bedtime. 10 capsule 0  . dorzolamide-timolol (COSOPT) 22.3-6.8 MG/ML ophthalmic solution Place 1 drop into both eyes 2 (two) times daily.  98  . Ensure Max Protein (ENSURE MAX PROTEIN) LIQD Take 330 mLs (11 oz total) by mouth 2 (two) times daily. 330 mL 0  . ferrous sulfate 325 (65 FE) MG tablet Take 325 mg by mouth daily with breakfast.    . fluticasone (FLONASE) 50 MCG/ACT nasal spray Place 1 spray into both nostrils daily. (Patient not taking: Reported on 08/05/2018) 16 g 1  . gabapentin (NEURONTIN) 600 MG tablet Take 600 mg by mouth 2 (two) times daily.     Marland Kitchen guaiFENesin (MUCINEX) 600 MG 12 hr tablet Take 1 tablet (600 mg total) by mouth 2 (two) times daily. 10 tablet 0  . HUMALOG KWIKPEN 100 UNIT/ML KiwkPen Inject 4 Units into the skin 3 (three) times daily.  4  . HYDROcodone-acetaminophen (NORCO) 10-325 MG tablet Take 1 tablet by mouth every 6 (six) hours as needed for moderate pain.    . Insulin Detemir (LEVEMIR FLEXTOUCH) 100 UNIT/ML Pen Inject 20 Units into the skin daily.    . isosorbide-hydrALAZINE (BIDIL) 20-37.5 MG tablet Take 1 tablet by mouth 3 (three) times daily. (Patient taking differently: Take 2 tablets by mouth 3 (three) times  daily. ) 90 tablet 0  . methocarbamol (ROBAXIN) 750 MG tablet Take 1 tablet (750 mg total) by mouth every 6 (six) hours as needed for muscle spasms. 30 tablet 0  . metoprolol succinate (TOPROL-XL) 100 MG 24 hr tablet TAKE 1 TABLET BY MOUTH EVERY DAY (Patient taking differently: Take 100 mg by mouth daily. ) 30 tablet 5  . Multiple Vitamin (MULTIVITAMIN WITH MINERALS) TABS tablet Take 1 tablet by mouth daily. 30 tablet 0  . mupirocin ointment (BACTROBAN) 2 % Apply 1 application topically daily. (Patient not taking: Reported on 08/05/2018) 22 g 0  . predniSONE (DELTASONE) 20 MG tablet Take 2 tablets (40 mg total) by mouth daily with breakfast. 6 tablet 0  . saccharomyces boulardii (FLORASTOR) 250 MG capsule Take 1 capsule (250 mg total) by mouth 2 (two) times daily.    Marland Kitchen torsemide (DEMADEX) 20 MG tablet Take 10 mg by mouth daily.     No current facility-administered medications on file prior to visit.     Cardiovascular studies:  Hospital Echocardiogram 07/19/2018:  1. The left ventricle has low normal systolic function, with an ejection fraction of 50-55%. There is concentric left ventricular hypertrophy. No evidence of left ventricular regional wall motion abnormalities. Diastolic function not assessed.  2. The right ventricle has normal systolc function. There is no increase in right ventricular wall thickness.  3. Mild tricuspid regurgitation. This is a limited study and inadequate to assess RV systolic pressure.  4. Unlike previous study in 12/2017, pulmonary hypertension and diastolic dysfunction not adequately evaluated in this limited study. No other significant change noted.  EKG 07/30/2018: Sinus rhythm 70 bpm First degree A-V block  Left atrial enlargement.  Nonspecific T-abnormality.   Carotid artery duplex 07/20/2018 Stenosis in the right internal carotid artery (16-49%). Severe stenosis in the left internal carotid artery (50-69%). The left PSV internal/common carotid artery  ratio of 4.0 is consistent with a stenosis of >  70%. Stenosis in the left external carotid artery (>50%). Antegrade right vertebral artery flow. Antegrade left vertebral artery flow. Compared to 12/29/2017, left ICA stenosis appears to have slightly progressed. Follow up in six months is appropriate if clinically indicated.  Echocardiogram 12/22/2017: Left ventricle: The cavity size was normal. There was moderate  concentric hypertrophy. Systolic function was normal. The  estimated ejection fraction was in the range of 50% to 55%.  Features are consistent with a pseudonormal left ventricular  filling pattern, with concomitant abnormal relaxation and  increased filling pressure (grade 2 diastolic dysfunction).  Doppler parameters are consistent with both elevated ventricular  end-diastolic filling pressure and elevated left atrial filling  pressure. - Ventricular septum: The contour showed diastolic flattening. - Mitral valve: There was mild regurgitation. - Right ventricle: The cavity size was mildly dilated. - Atrial septum: No defect or patent foramen ovale was identified. - Pulmonary arteries: PA peak pressure: 64 mm Hg (S). - Compared to 03/30/2017, pulmonary hypertensin and RV strain is  new.  Cath 04/06/2017: Successful complex PTCA and overlapping stents mid LAD Complexity due to severe calcification and severe tortuosity Resolute 3.0 X 18 mm Synergy 3.0 X 16 mm Residual 10% unde expansion of proximal stent  dLAD 2.75 X 26 mm Resolure DES  Recent labs:  Results for RASHIDI, LOH (MRN [id number]) as of 09/10/2018 11:38  Ref. Range 08/07/2018 21:11 08/08/2018 02:55 08/08/2018 09:51 08/09/2018 04:47 08/10/2018 06:29 08/11/2018 05:00 08/12/2018 02:39 08/14/2018 03:50 08/15/2018 02:54  BASIC METABOLIC PANEL Unknown    Rpt (A) Rpt (A) Rpt (A) Rpt (A)    COMPREHENSIVE METABOLIC PANEL Unknown  Rpt (A)      Rpt (A) Rpt (A)  Sodium Latest Ref Range: 135 - 145 mmol/L  141  141  141 141 142 139 133 (L)  Potassium Latest Ref Range: 3.5 - 5.1 mmol/L  3.3 (L)  3.6 3.1 (L) 3.7 3.5 3.4 (L) 4.4  Chloride Latest Ref Range: 98 - 111 mmol/L  113 (H)  112 (H) 107 108 109 104 101  CO2 Latest Ref Range: 22 - 32 mmol/L  18 (L)  19 (L) 18 (L) 21 (L) 22 23 23   Glucose Latest Ref Range: 70 - 99 mg/dL  123 (H)  144 (H) 140 (H) 212 (H) 152 (H) 214 (H) 350 (H)  BUN Latest Ref Range: 8 - 23 mg/dL  27 (H)  19 11 12 10 12 15   Creatinine Latest Ref Range: 0.61 - 1.24 mg/dL  2.09 (H)  1.73 (H) 1.56 (H) 1.74 (H) 1.83 (H) 1.74 (H) 1.88 (H)  Calcium Latest Ref Range: 8.9 - 10.3 mg/dL  7.2 (L)  8.1 (L) 8.4 (L) 8.3 (L) 8.4 (L) 8.4 (L) 8.4 (L)  Anion gap Latest Ref Range: 5 - 15   10  10 16  (H) 12 11 12 9   Phosphorus Latest Ref Range: 2.5 - 4.6 mg/dL    3.1    3.6 3.6  Magnesium Latest Ref Range: 1.7 - 2.4 mg/dL    1.9 1.8 1.7  1.8 1.9  Alkaline Phosphatase Latest Ref Range: 38 - 126 U/L  180 (H)      187 (H) 197 (H)  Albumin Latest Ref Range: 3.5 - 5.0 g/dL  2.1 (L)      2.4 (L) 2.5 (L)  Uric Acid, Serum Latest Ref Range: 3.7 - 8.6 mg/dL       4.4    AST Latest Ref Range: 15 - 41 U/L  31  57 (H) 65 (H)  ALT Latest Ref Range: 0 - 44 U/L  19      48 (H) 65 (H)  Total Protein Latest Ref Range: 6.5 - 8.1 g/dL  5.9 (L)      7.6 7.5  Total Bilirubin Latest Ref Range: 0.3 - 1.2 mg/dL  0.8      1.1 1.0  GFR, Est Non African American Latest Ref Range: >60 mL/min  31 (L)  39 (L) 44 (L) 39 (L) 36 (L) 39 (L) 35 (L)  GFR, Est African American Latest Ref Range: >60 mL/min  36 (L)  45 (L) 51 (L) 45 (L) 42 (L) 45 (L) 41 (L)  Troponin I Latest Ref Range: <0.03 ng/mL 0.70 (HH) 1.37 (HH) 1.15 (HH) 0.38 (HH)       Folate, Hemolysate Latest Ref Range: Not Estab. ng/mL  312.0         Folate, RBC Latest Ref Range: >498 ng/mL  1,387         Procalcitonin Latest Units: ng/mL  60.37  25.59     0.36  Vitamin B12 Latest Ref Range: 180 - 914 pg/mL  1,499 (H)          Results for David [name], David [name] (MRN [id number]) as of  09/10/2018 11:38  Ref. Range 08/07/2018 11:05 08/07/2018 14:05 08/08/2018 02:55 08/08/2018 02:55 08/09/2018 04:47 08/10/2018 06:29 08/11/2018 05:00 08/12/2018 02:39 08/14/2018 03:50 08/15/2018 03:17  WBC Latest Ref Range: 4.0 - 10.5 K/uL 31.9 (H)  13.9 (H)  12.6 (H) 14.1 (H) 14.7 (H) 16.2 (H) 17.0 (H) 19.5 (H)  RBC Latest Ref Range: 4.22 - 5.81 MIL/uL 2.95 (L)  2.23 (L)  2.38 (L) 2.40 (L) 2.27 (L) 2.24 (L) 2.30 (L) 2.35 (L)  Hemoglobin Latest Ref Range: 13.0 - 17.0 g/dL 10.2 (L) 9.5 (L) 7.6 (L)  8.2 (L) 8.1 (L) 7.9 (L) 7.7 (L) 7.6 (L) 7.8 (L)  HCT Latest Ref Range: 39.0 - 52.0 % 33.1 (L) 28.0 (L) 24.0 (L) 22.5 (L) 25.5 (L) 25.7 (L) 24.4 (L) 24.1 (L) 24.5 (L) 25.2 (L)  MCV Latest Ref Range: 80.0 - 100.0 fL 112.2 (H)  107.6 (H)  107.1 (H) 107.1 (H) 107.5 (H) 107.6 (H) 106.5 (H) 107.2 (H)  MCH Latest Ref Range: 26.0 - 34.0 pg 34.6 (H)  34.1 (H)  34.5 (H) 33.8 34.8 (H) 34.4 (H) 33.0 33.2  MCHC Latest Ref Range: 30.0 - 36.0 g/dL 30.8  31.7  32.2 31.5 32.4 32.0 31.0 31.0  RDW Latest Ref Range: 11.5 - 15.5 % 11.8  11.7  11.9 11.8 12.0 11.9 11.7 11.9  Platelets Latest Ref Range: 150 - 400 K/uL 370  265  257 276 307 285 304 315  nRBC Latest Ref Range: 0.0 - 0.2 % 0.0  0.0  0.0 0.0 0.0 0.0 0.0 0.0  Neutrophils Latest Units: %   84  80    76 92  Lymphocytes Latest Units: %   9  10    14 5   Monocytes Relative Latest Units: %   6  7    9 2   Eosinophil Latest Units: %   0  1    0 0  Basophil Latest Units: %   0  0    0 0  Immature Granulocytes Latest Units: %   1  2    1 1   NEUT# Latest Ref Range: 1.7 - 7.7 K/uL   11.6 (H)  10.1 (H)    12.9 (H) 18.0 (H)  Lymphocyte # Latest  Ref Range: 0.7 - 4.0 K/uL   1.2  1.2    2.3 0.9  Monocyte # Latest Ref Range: 0.1 - 1.0 K/uL   0.9  0.9    1.5 (H) 0.4  Eosinophils Absolute Latest Ref Range: 0.0 - 0.5 K/uL   0.0  0.1    0.0 0.0  Basophils Absolute Latest Ref Range: 0.0 - 0.1 K/uL   0.0  0.0    0.0 0.0  Abs Immature Granulocytes Latest Ref Range: 0.00 - 0.07 K/uL   0.17 (H)  0.22  (H)    0.23 (H) 0.26 (H)    Review of Systems  Constitution: Negative for decreased appetite, malaise/fatigue, weight gain and weight loss.  HENT: Negative for congestion.   Eyes: Negative for visual disturbance.  Cardiovascular: Positive for leg swelling. Negative for chest pain, dyspnea on exertion, palpitations and syncope.  Respiratory: Negative for shortness of breath.   Endocrine: Negative for cold intolerance.  Hematologic/Lymphatic: Does not bruise/bleed easily.  Skin: Negative for itching and rash.  Musculoskeletal: Negative for myalgias.  Gastrointestinal: Negative for abdominal pain, nausea and vomiting.  Genitourinary: Negative for dysuria.  Neurological: Negative for dizziness and weakness.  Psychiatric/Behavioral: The patient is not nervous/anxious.   All other systems reviewed and are negative.        Vitals:   09/10/18 1210  BP: 134/75  Pulse: 63   (Measured by the patient using a home BP monitor)   Observation/findings during video visit   Objective:    Physical Exam  Constitutional: He is oriented to person, place, and time. He appears well-developed and well-nourished. No distress.  Pulmonary/Chest: Effort normal.  Musculoskeletal:        General: Edema (1-2+, mostly in upper legs. He wears compression stockings) present.  Neurological: He is alert and oriented to person, place, and time.  Psychiatric: He has a normal mood and affect.  Nursing note and vitals reviewed.         Assessment & Recommendations:   73 year old African-American male with coronary artery disease status post complex LAD PCI 03/2017, HFpEF, pulmonary hypertension, type II diabetes mellitus, hypertension, CKD III. Recent COVID negative, flu negative, multifocal pneumonia, sepsis, respiratory failure 07/2018, now recovering.  Leg edema: Continue torsemide 20 mg daily. Reduce amlodipine to 5 mg daily.  HFpEF: Continue torsemide 20 mg PO daily for now.  CAD: Stable. No  current angina. Troponin elevation on hospital admission in 07/2018 likely due to supply demand mismatch in the setting of septic shock. Continue aspirin, metoprolol succinate. Lipitor is currently reduced to 10 mg due elevated liver enzymes. Will check CMP today  Pulmonary hypertension: WHO Grp II, possibly out of proportion. Continue managmenet with diuretics at this time.  Hypertension: Controlled. If SBP >150 mmHg, increase Bidil to 2 tab 3 times daily.  Moderate carotid stenoses: Asymptomatic. Continue risk factor modification. Repeat carotid ultrasound in 01/2019.  Anemia: On hospital discharge in 07/2018. Will check CBC  Follow up in 1 week to discuss CMP, CBC, and to reassess leg swelling and blood pressure.   Nigel Mormon, MD [facility] Pager: [phone] Office: [phone] If no answer Cell [phone]

## 2018-09-11 LAB — COMPREHENSIVE METABOLIC PANEL
ALT: 22 IU/L (ref 0–44)
AST: 25 IU/L (ref 0–40)
Albumin/Globulin Ratio: 1.2 (ref 1.2–2.2)
Albumin: 3.7 g/dL (ref 3.7–4.7)
Alkaline Phosphatase: 101 IU/L (ref 39–117)
BUN/Creatinine Ratio: 11 (ref 10–24)
BUN: 21 mg/dL (ref 8–27)
Bilirubin Total: 0.6 mg/dL (ref 0.0–1.2)
CO2: 24 mmol/L (ref 20–29)
Calcium: 8.6 mg/dL (ref 8.6–10.2)
Chloride: 100 mmol/L (ref 96–106)
Creatinine, Ser: 1.84 mg/dL — ABNORMAL HIGH (ref 0.76–1.27)
GFR calc Af Amer: 42 mL/min/{1.73_m2} — ABNORMAL LOW (ref >59)
GFR calc non Af Amer: 36 mL/min/{1.73_m2} — ABNORMAL LOW (ref >59)
Globulin, Total: 3.1 g/dL (ref 1.5–4.5)
Glucose: 93 mg/dL (ref 65–99)
Potassium: 5.2 mmol/L (ref 3.5–5.2)
Sodium: 140 mmol/L (ref 134–144)
Total Protein: 6.8 g/dL (ref 6.0–8.5)

## 2018-09-11 LAB — CBC
Hematocrit: 23.6 % — ABNORMAL LOW (ref 37.5–51.0)
Hemoglobin: 8 g/dL — ABNORMAL LOW (ref 13.0–17.7)
MCH: 36.7 pg — ABNORMAL HIGH (ref 26.6–33.0)
MCHC: 33.9 g/dL (ref 31.5–35.7)
MCV: 108 fL — ABNORMAL HIGH (ref 79–97)
Platelets: 206 10*3/uL (ref 150–450)
RBC: 2.18 x10E6/uL — CL (ref 4.14–5.80)
RDW: 12.5 % (ref 11.6–15.4)
WBC: 6.3 10*3/uL (ref 3.4–10.8)

## 2018-09-17 ENCOUNTER — Encounter: Payer: Self-pay | Admitting: Cardiology

## 2018-09-17 ENCOUNTER — Ambulatory Visit (INDEPENDENT_AMBULATORY_CARE_PROVIDER_SITE_OTHER): Payer: Medicare Other | Admitting: Cardiology

## 2018-09-17 ENCOUNTER — Other Ambulatory Visit: Payer: Self-pay

## 2018-09-17 VITALS — BP 144/71 | HR 69 | Wt 220.0 lb

## 2018-09-17 DIAGNOSIS — N183 Chronic kidney disease, stage 3 unspecified: Secondary | ICD-10-CM

## 2018-09-17 DIAGNOSIS — I129 Hypertensive chronic kidney disease with stage 1 through stage 4 chronic kidney disease, or unspecified chronic kidney disease: Secondary | ICD-10-CM | POA: Diagnosis not present

## 2018-09-17 DIAGNOSIS — I5032 Chronic diastolic (congestive) heart failure: Secondary | ICD-10-CM

## 2018-09-17 DIAGNOSIS — I10 Essential (primary) hypertension: Secondary | ICD-10-CM

## 2018-09-17 DIAGNOSIS — R600 Localized edema: Secondary | ICD-10-CM | POA: Insufficient documentation

## 2018-09-17 MED ORDER — TORSEMIDE 20 MG PO TABS: 20.0000 mg | ORAL_TABLET | Freq: Two times a day (BID) | ORAL | 1 refills | Status: DC

## 2018-09-17 NOTE — Progress Notes (Signed)
Virtual Visit via Video Note   Subjective:   DEONDRAE [name], male    DOB: [birth year], 73 y.o.   MRN: [id number]   I connected with the patient on 09/17/18 by a video enabled telemedicine application and verified that I am speaking with the correct person using two identifiers.     I discussed the limitations of evaluation and management by telemedicine and the availability of in person appointments. The patient expressed understanding and agreed to proceed.   This visit type was conducted due to national recommendations for restrictions regarding the COVID-19 Pandemic (e.g. social distancing).  This format is felt to be most appropriate for this patient at this time.  All issues noted in this document were discussed and addressed.  No physical exam was performed (except for noted visual exam findings with Tele health visits).  The patient has consented to conduct a Tele health visit and understands insurance will be billed.      Chief complaint:  Leg swelling   HPI  73 year old African-American male with coronary artery disease status post complex LAD PCI 03/2017, HFpEF, pulmonary hypertension, type II diabetes mellitus, hypertension, CKD III.  At last visit, I continued torsemide 20 mg daily, reduced amlodipine 5 mg daily, increased bidil to 1 tab tid. Since his las visit, he has lost close to 10 lb. However, he continues to have leg edema. He had one sharp chest pain yesterday while lifting grocery bagsthat only lasted for a few seconds. He has not felt any pain with walking.   Past Medical History:  Diagnosis Date  . Arthritis   . CAD in native artery    s/p stent in 11/18  . CHF (congestive heart failure) ([place])    normal echo in 11/18  . CKD (chronic kidney disease)   . DDD (degenerative disc disease), cervical   . Diabetes mellitus with complication (HCC)    Type II  . Diabetic neuropathy ([place])   . Diabetic retinopathy ([place])   . GERD (gastroesophageal reflux disease)    . Glaucoma   . Hypertension      Past Surgical History:  Procedure Laterality Date  . AMPUTATION Right 07/09/2016   Procedure: Right Great Toe Amputation at Metatarsophalangeal Joint;  Surgeon: [name], MD;  Location: [facility];  Service: Orthopedics;  Laterality: Right;  . AMPUTATION Right 05/28/2018   Procedure: RIGHT SECOND TOE AMPUTATION;  Surgeon: [name], MD;  Location: [facility];  Service: Orthopedics;  Laterality: Right;  . BACK SURGERY     4  . CARDIAC CATHETERIZATION    . CORONARY STENT INTERVENTION N/A 04/06/2017   Procedure: CORONARY STENT INTERVENTION;  Surgeon: Nigel Mormon, MD;  Location: [facility];  Service: Cardiovascular;  Laterality: N/A;  . CORONARY STENT INTERVENTION N/A 04/07/2017   Procedure: CORONARY STENT INTERVENTION;  Surgeon: Nigel Mormon, MD;  Location: [facility];  Service: Cardiovascular;  Laterality: N/A;  . CYST EXCISION     on Back  . ENDOTRACHEAL INTUBATION EMERGENT  08/07/2018      . JOINT REPLACEMENT    . LAPAROSCOPIC ABDOMINAL EXPLORATION N/A 11/26/2017   Procedure: LAPAROSCOPIC ABDOMINAL EXPLORATION, DRAINAGE OF APPENDICEAL ABCESS. PLACEMENT OF DRAIN;  Surgeon: Alphonsa Overall, MD;  Location: [facility];  Service: General;  Laterality: N/A;  . RIGHT/LEFT HEART CATH AND CORONARY ANGIOGRAPHY N/A 04/06/2017   Procedure: RIGHT/LEFT HEART CATH AND CORONARY ANGIOGRAPHY;  Surgeon: Nigel Mormon, MD;  Location: [facility];  Service: Cardiovascular;  Laterality: N/A;  . TOE SURGERY  05/2018   Left   . TOTAL HIP ARTHROPLASTY     Right      Social History   Socioeconomic History  . Marital status: Married    Spouse name: Not on file  . Number of children: 2  . Years of education: 38  . Highest education level: Master's degree (e.g., MA, MS, MEng, MEd, MSW, MBA)  Occupational History  . Occupation: Retired  Scientific laboratory technician  . Financial resource strain: Not hard at all  . Food insecurity:    Worry: Not on  file    Inability: Not on file  . Transportation needs:    Medical: Not on file    Non-medical: Not on file  Tobacco Use  . Smoking status: Never Smoker  . Smokeless tobacco: Never Used  Substance and Sexual Activity  . Alcohol use: Yes  . Drug use: No  . Sexual activity: Yes  Lifestyle  . Physical activity:    Days per week: 0 days    Minutes per session: 0 min  . Stress: Not at all  Relationships  . Social connections:    Talks on phone: Twice a week    Gets together: Twice a week    Attends religious service: 1 to 4 times per year    Active member of club or organization: Yes    Attends meetings of clubs or organizations: 1 to 4 times per year    Relationship status: Married  . Intimate partner violence:    Fear of current or ex partner: No    Emotionally abused: No    Physically abused: No    Forced sexual activity: No  Other Topics Concern  . Not on file  Social History Narrative  . Not on file     Family History  Problem Relation Age of Onset  . Diabetes Other   . Hyperlipidemia Other   . Hypertension Other   . Stroke Other   . Alzheimer's disease Other   . Thyroid disease Mother   . Diabetes Mellitus II Father   . Alzheimer's disease Father      Current Outpatient Medications on File Prior to Visit  Medication Sig Dispense Refill  . albuterol (PROVENTIL HFA;VENTOLIN HFA) 108 (90 Base) MCG/ACT inhaler Inhale 2 puffs into the lungs every 6 (six) hours as needed for wheezing or shortness of breath. (Patient not taking: Reported on 09/10/2018) 1 Inhaler 2  . amLODipine (NORVASC) 10 MG tablet Take 0.5 tablets (5 mg total) by mouth daily.    Marland Kitchen aspirin EC 81 MG tablet Take 81 mg by mouth daily.    Marland Kitchen atorvastatin (LIPITOR) 10 MG tablet Take 1 tablet (10 mg total) by mouth daily. 90 tablet 1  . dicyclomine (BENTYL) 10 MG capsule Take 1 capsule (10 mg total) by mouth 4 (four) times daily -  before meals and at bedtime. 10 capsule 0  . dorzolamide-timolol (COSOPT)  22.3-6.8 MG/ML ophthalmic solution Place 1 drop into both eyes 2 (two) times daily.  98  . Ensure Max Protein (ENSURE MAX PROTEIN) LIQD Take 330 mLs (11 oz total) by mouth 2 (two) times daily. 330 mL 0  . ferrous sulfate 325 (65 FE) MG tablet Take 325 mg by mouth daily with breakfast.    . gabapentin (NEURONTIN) 600 MG tablet Take 600 mg by mouth 2 (two) times daily.     Marland Kitchen guaiFENesin (MUCINEX) 600 MG 12 hr tablet Take 1 tablet (600 mg total)  by mouth 2 (two) times daily. 10 tablet 0  . HUMALOG KWIKPEN 100 UNIT/ML KiwkPen Inject 4 Units into the skin 3 (three) times daily.  4  . HYDROcodone-acetaminophen (NORCO) 10-325 MG tablet Take 1 tablet by mouth every 6 (six) hours as needed for moderate pain.    . Insulin Detemir (LEVEMIR FLEXTOUCH) 100 UNIT/ML Pen Inject 20 Units into the skin daily. (Patient taking differently: Inject 30 Units into the skin daily. )    . isosorbide-hydrALAZINE (BIDIL) 20-37.5 MG tablet Take 1 tablet by mouth 3 (three) times daily. (Patient taking differently: Take 2 tablets by mouth 2 (two) times a day. ) 90 tablet 0  . methocarbamol (ROBAXIN) 750 MG tablet Take 1 tablet (750 mg total) by mouth every 6 (six) hours as needed for muscle spasms. 30 tablet 0  . metoprolol succinate (TOPROL-XL) 100 MG 24 hr tablet TAKE 1 TABLET BY MOUTH EVERY DAY (Patient taking differently: Take 100 mg by mouth daily. ) 30 tablet 5  . Multiple Vitamin (MULTIVITAMIN WITH MINERALS) TABS tablet Take 1 tablet by mouth daily. 30 tablet 0  . mupirocin ointment (BACTROBAN) 2 % Apply 1 application topically daily. 22 g 0  . saccharomyces boulardii (FLORASTOR) 250 MG capsule Take 1 capsule (250 mg total) by mouth 2 (two) times daily.    Marland Kitchen torsemide (DEMADEX) 20 MG tablet Take 20 mg by mouth daily.      No current facility-administered medications on file prior to visit.     Cardiovascular studies:  Hospital Echocardiogram 07/19/2018:  1. The left ventricle has low normal systolic function, with an  ejection fraction of 50-55%. There is concentric left ventricular hypertrophy. No evidence of left ventricular regional wall motion abnormalities. Diastolic function not assessed.  2. The right ventricle has normal systolc function. There is no increase in right ventricular wall thickness.  3. Mild tricuspid regurgitation. This is a limited study and inadequate to assess RV systolic pressure.  4. Unlike previous study in 12/2017, pulmonary hypertension and diastolic dysfunction not adequately evaluated in this limited study. No other significant change noted.  EKG 07/30/2018: Sinus rhythm 70 bpm First degree A-V block  Left atrial enlargement.  Nonspecific T-abnormality.   Carotid artery duplex 07/20/2018 Stenosis in the right internal carotid artery (16-49%). Severe stenosis in the left internal carotid artery (50-69%). The left PSV internal/common carotid artery ratio of 4.0 is consistent with a stenosis of >70%. Stenosis in the left external carotid artery (>50%). Antegrade right vertebral artery flow. Antegrade left vertebral artery flow. Compared to 12/29/2017, left ICA stenosis appears to have slightly progressed. Follow up in six months is appropriate if clinically indicated.  Echocardiogram 12/22/2017: Left ventricle: The cavity size was normal. There was moderate  concentric hypertrophy. Systolic function was normal. The  estimated ejection fraction was in the range of 50% to 55%.  Features are consistent with a pseudonormal left ventricular  filling pattern, with concomitant abnormal relaxation and  increased filling pressure (grade 2 diastolic dysfunction).  Doppler parameters are consistent with both elevated ventricular  end-diastolic filling pressure and elevated left atrial filling  pressure. - Ventricular septum: The contour showed diastolic flattening. - Mitral valve: There was mild regurgitation. - Right ventricle: The cavity size was mildly dilated. -  Atrial septum: No defect or patent foramen ovale was identified. - Pulmonary arteries: PA peak pressure: 64 mm Hg (S). - Compared to 03/30/2017, pulmonary hypertensin and RV strain is  new.  Cath 04/06/2017: Successful complex PTCA and overlapping stents mid LAD  Complexity due to severe calcification and severe tortuosity Resolute 3.0 X 18 mm Synergy 3.0 X 16 mm Residual 10% unde expansion of proximal stent  dLAD 2.75 X 26 mm Resolure DES  Recent labs:  Results for KAYL, STOGDILL (MRN [id number]) as of 09/17/2018 12:52  Ref. Range 08/15/2018 03:17 09/10/2018 15:26  COMPREHENSIVE METABOLIC PANEL Unknown Rpt (A) Rpt (A)  Sodium Latest Ref Range: 134 - 144 mmol/L 133 (L) 140  Potassium Latest Ref Range: 3.5 - 5.2 mmol/L 4.4 5.2  Chloride Latest Ref Range: 96 - 106 mmol/L 101 100  CO2 Latest Ref Range: 20 - 29 mmol/L 23 24  Glucose Latest Ref Range: 65 - 99 mg/dL 350 (H) 93  BUN Latest Ref Range: 8 - 27 mg/dL 15 21  Creatinine Latest Ref Range: 0.76 - 1.27 mg/dL 1.88 (H) 1.84 (H)  Calcium Latest Ref Range: 8.6 - 10.2 mg/dL 8.4 (L) 8.6  Anion gap Latest Ref Range: 5 - 15  9   BUN/Creatinine Ratio Latest Ref Range: 10 - 24   11  Phosphorus Latest Ref Range: 2.5 - 4.6 mg/dL 3.6   Magnesium Latest Ref Range: 1.7 - 2.4 mg/dL 1.9   Alkaline Phosphatase Latest Ref Range: 39 - 117 IU/L 197 (H) 101  Albumin Latest Ref Range: 3.7 - 4.7 g/dL 2.5 (L) 3.7  Albumin/Globulin Ratio Latest Ref Range: 1.2 - 2.2   1.2  AST Latest Ref Range: 0 - 40 IU/L 65 (H) 25  ALT Latest Ref Range: 0 - 44 IU/L 65 (H) 22  Total Protein Latest Ref Range: 6.0 - 8.5 g/dL 7.5 6.8  Total Bilirubin Latest Ref Range: 0.0 - 1.2 mg/dL 1.0 0.6  GFR, Est Non African American Latest Ref Range: >59 mL/min/1.73 35 (L) 36 (L)  GFR, Est African American Latest Ref Range: >59 mL/min/1.73 41 (L) 42 (L)    Results for SPYROS, WINCH (MRN [id number]) as of 09/17/2018 12:52  Ref. Range 08/15/2018 03:17 09/10/2018 15:27  WBC Latest  Ref Range: 3.4 - 10.8 x10E3/uL 19.5 (H) 6.3  RBC Latest Ref Range: 4.14 - 5.80 x10E6/uL 2.35 (L) 2.18 (LL)  Hemoglobin Latest Ref Range: 13.0 - 17.7 g/dL 7.8 (L) 8.0 (L)  HCT Latest Ref Range: 37.5 - 51.0 % 25.2 (L) 23.6 (L)  MCV Latest Ref Range: 79 - 97 fL 107.2 (H) 108 (H)  MCH Latest Ref Range: 26.6 - 33.0 pg 33.2 36.7 (H)  MCHC Latest Ref Range: 31.5 - 35.7 g/dL 31.0 33.9  RDW Latest Ref Range: 11.6 - 15.4 % 11.9 12.5  Platelets Latest Ref Range: 150 - 450 x10E3/uL 315 206  nRBC Latest Ref Range: 0.0 - 0.2 % 0.0     Review of Systems  Constitution: Negative for decreased appetite, malaise/fatigue, weight gain and weight loss.  HENT: Negative for congestion.   Eyes: Negative for visual disturbance.  Cardiovascular: Positive for leg swelling. Negative for chest pain, dyspnea on exertion, palpitations and syncope.  Respiratory: Negative for shortness of breath.   Endocrine: Negative for cold intolerance.  Hematologic/Lymphatic: Does not bruise/bleed easily.  Skin: Negative for itching and rash.  Musculoskeletal: Negative for myalgias.  Gastrointestinal: Negative for abdominal pain, nausea and vomiting.  Genitourinary: Negative for dysuria.  Neurological: Negative for dizziness and weakness.  Psychiatric/Behavioral: The patient is not nervous/anxious.   All other systems reviewed and are negative.        There were no vitals filed for this visit. (Measured by the patient using a home BP monitor)  Observation/findings during video visit   Objective:    Physical Exam  Constitutional: He is oriented to person, place, and time. He appears well-developed and well-nourished. No distress.  Pulmonary/Chest: Effort normal.  Musculoskeletal:        General: Edema (1-2+, mostly in upper legs. He wears compression stockings) present.  Neurological: He is alert and oriented to person, place, and time.  Psychiatric: He has a normal mood and affect.  Nursing note and vitals  reviewed.         Assessment & Recommendations:   73 year old African-American male with coronary artery disease status post complex LAD PCI 03/2017, HFpEF, pulmonary hypertension, type II diabetes mellitus, hypertension, CKD III. Recent COVID negative, flu negative, multifocal pneumonia, sepsis, respiratory failure 07/2018, now recovering.  Leg edema/HFpEF: Increase torsemide 20 mg bid. Recheck BMP in 10 days. I counseled him regarding salt restriction diet.   CAD: Stable. No current angina. Troponin elevation on hospital admission in 07/2018 likely due to supply demand mismatch in the setting of septic shock. Continue aspirin, metoprolol succinate. Lipitor is currently reduced to 10 mg due elevated liver enzymes. LFT's have normalized. Will increase this. Given his anemia, I recommend medical conservative management only.  Pulmonary hypertension: WHO Grp II, possibly out of proportion. Continue managmenet with diuretics at this time.  Hypertension: Continue current therapy.   Moderate carotid stenoses: Asymptomatic. Continue risk factor modification. Repeat carotid ultrasound in 01/2019.  Anemia: Stable. >ikely due to CKD. Continue iron supplementation. Follow p with PCP.  Follow up in 2 weeks.  Nigel Mormon, MD [facility] Pager: [phone] Office: [phone] If no answer Cell [phone]

## 2018-09-23 ENCOUNTER — Other Ambulatory Visit: Payer: Self-pay | Admitting: Cardiology

## 2018-09-23 DIAGNOSIS — I5030 Unspecified diastolic (congestive) heart failure: Secondary | ICD-10-CM

## 2018-09-23 NOTE — Telephone Encounter (Signed)
Please fill

## 2018-09-24 ENCOUNTER — Other Ambulatory Visit: Payer: Self-pay | Admitting: Cardiology

## 2018-09-24 NOTE — Telephone Encounter (Signed)
Bidil he is taking 2 in the morning and 2 at night / Amlodipine 10 mg QD ; Pt says that the swelling has gone down and his BP was 151/86 pulse 60 @ 8:32am 146/85 in the right arm ; weight this morning was 220lb

## 2018-09-29 ENCOUNTER — Other Ambulatory Visit: Payer: Self-pay | Admitting: Cardiology

## 2018-09-29 MED ORDER — AMLODIPINE BESYLATE 10 MG PO TABS: 10.0000 mg | ORAL_TABLET | Freq: Every day | ORAL | 2 refills | Status: DC

## 2018-10-08 ENCOUNTER — Ambulatory Visit: Payer: Medicare Other | Admitting: Cardiology

## 2018-10-13 LAB — BASIC METABOLIC PANEL
BUN/Creatinine Ratio: 12 (ref 10–24)
BUN: 23 mg/dL (ref 8–27)
CO2: 24 mmol/L (ref 20–29)
Calcium: 8.8 mg/dL (ref 8.6–10.2)
Chloride: 102 mmol/L (ref 96–106)
Creatinine, Ser: 1.85 mg/dL — ABNORMAL HIGH (ref 0.76–1.27)
GFR calc Af Amer: 41 mL/min/{1.73_m2} — ABNORMAL LOW (ref >59)
GFR calc non Af Amer: 36 mL/min/{1.73_m2} — ABNORMAL LOW (ref >59)
Glucose: 247 mg/dL — ABNORMAL HIGH (ref 65–99)
Potassium: 4.8 mmol/L (ref 3.5–5.2)
Sodium: 139 mmol/L (ref 134–144)

## 2018-10-15 ENCOUNTER — Ambulatory Visit (INDEPENDENT_AMBULATORY_CARE_PROVIDER_SITE_OTHER): Payer: Medicare Other | Admitting: Cardiology

## 2018-10-15 ENCOUNTER — Other Ambulatory Visit: Payer: Self-pay

## 2018-10-15 ENCOUNTER — Encounter: Payer: Self-pay | Admitting: Cardiology

## 2018-10-15 VITALS — BP 137/65 | HR 60 | Ht 74.0 in | Wt 220.0 lb

## 2018-10-15 DIAGNOSIS — I2510 Atherosclerotic heart disease of native coronary artery without angina pectoris: Secondary | ICD-10-CM | POA: Diagnosis not present

## 2018-10-15 DIAGNOSIS — N183 Chronic kidney disease, stage 3 unspecified: Secondary | ICD-10-CM

## 2018-10-15 DIAGNOSIS — I5032 Chronic diastolic (congestive) heart failure: Secondary | ICD-10-CM | POA: Diagnosis not present

## 2018-10-15 DIAGNOSIS — E782 Mixed hyperlipidemia: Secondary | ICD-10-CM | POA: Diagnosis not present

## 2018-10-15 DIAGNOSIS — I129 Hypertensive chronic kidney disease with stage 1 through stage 4 chronic kidney disease, or unspecified chronic kidney disease: Secondary | ICD-10-CM

## 2018-10-15 MED ORDER — AMLODIPINE BESYLATE 5 MG PO TABS: 5.0000 mg | ORAL_TABLET | Freq: Every day | ORAL | 1 refills | Status: DC

## 2018-10-15 MED ORDER — ATORVASTATIN CALCIUM 40 MG PO TABS: 40.0000 mg | ORAL_TABLET | Freq: Every day | ORAL | 1 refills | Status: DC

## 2018-10-15 NOTE — Progress Notes (Signed)
Virtual Visit via Video Note   Subjective:   David [name], male    DOB: [birth year], 73 y.o.   MRN: [id number]   I connected with the patient on 10/15/18 by a video enabled telemedicine application and verified that I am speaking with the correct person using two identifiers.     I discussed the limitations of evaluation and management by telemedicine and the availability of in person appointments. The patient expressed understanding and agreed to proceed.   This visit type was conducted due to national recommendations for restrictions regarding the COVID-19 Pandemic (e.g. social distancing).  This format is felt to be most appropriate for this patient at this time.  All issues noted in this document were discussed and addressed.  No physical exam was performed (except for noted visual exam findings with Tele health visits).  The patient has consented to conduct a Tele health visit and understands insurance will be billed.      Chief complaint:  Leg swelling   HPI  73 year old African-American male with coronary artery disease status post complex LAD PCI 03/2017, HFpEF, pulmonary hypertension, type II diabetes mellitus, hypertension, CKD III.  He is doing well. Leg swelling has resolved. He is more energetic and doing more activity around the house. He denies chest pain, shortness of breath, palpitations, leg edema, orthopnea, PND, TIA/syncope.  Past Medical History:  Diagnosis Date  . Arthritis   . CAD in native artery    s/p stent in 11/18  . CHF (congestive heart failure) ([place])    normal echo in 11/18  . CKD (chronic kidney disease)   . DDD (degenerative disc disease), cervical   . Diabetes mellitus with complication (HCC)    Type II  . Diabetic neuropathy ([place])   . Diabetic retinopathy ([place])   . GERD (gastroesophageal reflux disease)   . Glaucoma   . Hypertension      Past Surgical History:  Procedure Laterality Date  . AMPUTATION Right 07/09/2016   Procedure:  Right Great Toe Amputation at Metatarsophalangeal Joint;  Surgeon: [name], MD;  Location: [place];  Service: Orthopedics;  Laterality: Right;  . AMPUTATION Right 05/28/2018   Procedure: RIGHT SECOND TOE AMPUTATION;  Surgeon: [name], MD;  Location: [place];  Service: Orthopedics;  Laterality: Right;  . BACK SURGERY     4  . CARDIAC CATHETERIZATION    . CORONARY STENT INTERVENTION N/A 04/06/2017   Procedure: CORONARY STENT INTERVENTION;  Surgeon: Nigel Mormon, MD;  Location: [facility];  Service: Cardiovascular;  Laterality: N/A;  . CORONARY STENT INTERVENTION N/A 04/07/2017   Procedure: CORONARY STENT INTERVENTION;  Surgeon: Nigel Mormon, MD;  Location: [facility] [facility];  Service: Cardiovascular;  Laterality: N/A;  . CYST EXCISION     on Back  . ENDOTRACHEAL INTUBATION EMERGENT  08/07/2018      . JOINT REPLACEMENT    . LAPAROSCOPIC ABDOMINAL EXPLORATION N/A 11/26/2017   Procedure: LAPAROSCOPIC ABDOMINAL EXPLORATION, DRAINAGE OF APPENDICEAL ABCESS. PLACEMENT OF DRAIN;  Surgeon: Alphonsa Overall, MD;  Location: [place];  Service: General;  Laterality: N/A;  . RIGHT/LEFT HEART CATH AND CORONARY ANGIOGRAPHY N/A 04/06/2017   Procedure: RIGHT/LEFT HEART CATH AND CORONARY ANGIOGRAPHY;  Surgeon: Nigel Mormon, MD;  Location: [facility];  Service: Cardiovascular;  Laterality: N/A;  . TOE SURGERY  05/2018   Left   . TOTAL HIP ARTHROPLASTY     Right      Social History  Socioeconomic History  . Marital status: Married    Spouse name: Not on file  . Number of children: 2  . Years of education: 45  . Highest education level: Master's degree (e.g., MA, MS, MEng, MEd, MSW, MBA)  Occupational History  . Occupation: Retired  Scientific laboratory technician  . Financial resource strain: Not hard at all  . Food insecurity:    Worry: Not on file    Inability: Not on file  . Transportation needs:    Medical: Not on file    Non-medical: Not on file  Tobacco Use  .  Smoking status: Never Smoker  . Smokeless tobacco: Never Used  Substance and Sexual Activity  . Alcohol use: Yes  . Drug use: No  . Sexual activity: Yes  Lifestyle  . Physical activity:    Days per week: 0 days    Minutes per session: 0 min  . Stress: Not at all  Relationships  . Social connections:    Talks on phone: Twice a week    Gets together: Twice a week    Attends religious service: 1 to 4 times per year    Active member of club or organization: Yes    Attends meetings of clubs or organizations: 1 to 4 times per year    Relationship status: Married  . Intimate partner violence:    Fear of current or ex partner: No    Emotionally abused: No    Physically abused: No    Forced sexual activity: No  Other Topics Concern  . Not on file  Social History Narrative  . Not on file     Family History  Problem Relation Age of Onset  . Diabetes Other   . Hyperlipidemia Other   . Hypertension Other   . Stroke Other   . Alzheimer's disease Other   . Thyroid disease Mother   . Diabetes Mellitus II Father   . Alzheimer's disease Father      Current Outpatient Medications on File Prior to Visit  Medication Sig Dispense Refill  . albuterol (PROVENTIL HFA;VENTOLIN HFA) 108 (90 Base) MCG/ACT inhaler Inhale 2 puffs into the lungs every 6 (six) hours as needed for wheezing or shortness of breath. 1 Inhaler 2  . amLODipine (NORVASC) 10 MG tablet Take 1 tablet (10 mg total) by mouth daily. (Patient taking differently: Take 5 mg by mouth daily. ) 90 tablet 2  . aspirin EC 81 MG tablet Take 81 mg by mouth daily.    Marland Kitchen atorvastatin (LIPITOR) 10 MG tablet Take 1 tablet (10 mg total) by mouth daily. 90 tablet 1  . dicyclomine (BENTYL) 10 MG capsule Take 1 capsule (10 mg total) by mouth 4 (four) times daily -  before meals and at bedtime. (Patient taking differently: Take 10 mg by mouth 3 (three) times daily before meals. ) 10 capsule 0  . dorzolamide-timolol (COSOPT) 22.3-6.8 MG/ML  ophthalmic solution Place 1 drop into both eyes 2 (two) times daily.  98  . Ensure Max Protein (ENSURE MAX PROTEIN) LIQD Take 330 mLs (11 oz total) by mouth 2 (two) times daily. 330 mL 0  . ferrous sulfate 325 (65 FE) MG tablet Take 325 mg by mouth daily with breakfast.    . gabapentin (NEURONTIN) 600 MG tablet Take 600 mg by mouth 2 (two) times daily.     Marland Kitchen HUMALOG KWIKPEN 100 UNIT/ML KiwkPen Inject 4 Units into the skin 3 (three) times daily.  4  . HYDROcodone-acetaminophen (NORCO) 10-325 MG tablet  Take 1 tablet by mouth 2 (two) times a day.     . Insulin Detemir (LEVEMIR FLEXTOUCH) 100 UNIT/ML Pen Inject 20 Units into the skin daily. (Patient taking differently: Inject 30 Units into the skin daily. )    . isosorbide-hydrALAZINE (BIDIL) 20-37.5 MG tablet Take 2 tablets by mouth 2 (two) times a day. 180 tablet 2  . methocarbamol (ROBAXIN) 750 MG tablet Take 1 tablet (750 mg total) by mouth every 6 (six) hours as needed for muscle spasms. 30 tablet 0  . metoprolol succinate (TOPROL-XL) 100 MG 24 hr tablet TAKE 1 TABLET BY MOUTH EVERY DAY (Patient taking differently: Take 100 mg by mouth daily. ) 30 tablet 5  . Multiple Vitamin (MULTIVITAMIN WITH MINERALS) TABS tablet Take 1 tablet by mouth daily. 30 tablet 0  . mupirocin ointment (BACTROBAN) 2 % Apply 1 application topically daily. 22 g 0  . saccharomyces boulardii (FLORASTOR) 250 MG capsule Take 1 capsule (250 mg total) by mouth 2 (two) times daily.    Marland Kitchen torsemide (DEMADEX) 20 MG tablet Take 1 tablet (20 mg total) by mouth 2 (two) times daily. 30 tablet 1  . traZODone (DESYREL) 50 MG tablet Take 100 mg by mouth at bedtime.     Marland Kitchen guaiFENesin (MUCINEX) 600 MG 12 hr tablet Take 1 tablet (600 mg total) by mouth 2 (two) times daily. (Patient not taking: Reported on 10/15/2018) 10 tablet 0   No current facility-administered medications on file prior to visit.     Cardiovascular studies:  Hospital Echocardiogram 07/19/2018:  1. The left ventricle has  low normal systolic function, with an ejection fraction of 50-55%. There is concentric left ventricular hypertrophy. No evidence of left ventricular regional wall motion abnormalities. Diastolic function not assessed.  2. The right ventricle has normal systolc function. There is no increase in right ventricular wall thickness.  3. Mild tricuspid regurgitation. This is a limited study and inadequate to assess RV systolic pressure.  4. Unlike previous study in 12/2017, pulmonary hypertension and diastolic dysfunction not adequately evaluated in this limited study. No other significant change noted.  EKG 07/30/2018: Sinus rhythm 70 bpm First degree A-V block  Left atrial enlargement.  Nonspecific T-abnormality.   Carotid artery duplex 07/20/2018 Stenosis in the right internal carotid artery (16-49%). Severe stenosis in the left internal carotid artery (50-69%). The left PSV internal/common carotid artery ratio of 4.0 is consistent with a stenosis of >70%. Stenosis in the left external carotid artery (>50%). Antegrade right vertebral artery flow. Antegrade left vertebral artery flow. Compared to 12/29/2017, left ICA stenosis appears to have slightly progressed. Follow up in six months is appropriate if clinically indicated.  Echocardiogram 12/22/2017: Left ventricle: The cavity size was normal. There was moderate  concentric hypertrophy. Systolic function was normal. The  estimated ejection fraction was in the range of 50% to 55%.  Features are consistent with a pseudonormal left ventricular  filling pattern, with concomitant abnormal relaxation and  increased filling pressure (grade 2 diastolic dysfunction).  Doppler parameters are consistent with both elevated ventricular  end-diastolic filling pressure and elevated left atrial filling  pressure. - Ventricular septum: The contour showed diastolic flattening. - Mitral valve: There was mild regurgitation. - Right ventricle: The  cavity size was mildly dilated. - Atrial septum: No defect or patent foramen ovale was identified. - Pulmonary arteries: PA peak pressure: 64 mm Hg (S). - Compared to 03/30/2017, pulmonary hypertensin and RV strain is  new.  Cath 04/06/2017: Successful complex PTCA and overlapping stents  mid LAD Complexity due to severe calcification and severe tortuosity Resolute 3.0 X 18 mm Synergy 3.0 X 16 mm Residual 10% unde expansion of proximal stent  dLAD 2.75 X 26 mm Resolure DES  Recent labs:  Results for David [name], David [name] (MRN [id number]) as of 10/15/2018 13:50  Ref. Range 08/15/2018 03:17 09/10/2018 15:26 10/12/2018 75:17  BASIC METABOLIC PANEL Unknown   Rpt (A)  COMPREHENSIVE METABOLIC PANEL Unknown Rpt (A) Rpt (A)   Sodium Latest Ref Range: 134 - 144 mmol/L 133 (L) 140 139  Potassium Latest Ref Range: 3.5 - 5.2 mmol/L 4.4 5.2 4.8  Chloride Latest Ref Range: 96 - 106 mmol/L 101 100 102  CO2 Latest Ref Range: 20 - 29 mmol/L 23 24 24   Glucose Latest Ref Range: 65 - 99 mg/dL 350 (H) 93 247 (H)  BUN Latest Ref Range: 8 - 27 mg/dL 15 21 23   Creatinine Latest Ref Range: 0.76 - 1.27 mg/dL 1.88 (H) 1.84 (H) 1.85 (H)  Calcium Latest Ref Range: 8.6 - 10.2 mg/dL 8.4 (L) 8.6 8.8  Anion gap Latest Ref Range: 5 - 15  9    BUN/Creatinine Ratio Latest Ref Range: 10 - 24   11 12   Phosphorus Latest Ref Range: 2.5 - 4.6 mg/dL 3.6    Magnesium Latest Ref Range: 1.7 - 2.4 mg/dL 1.9    Alkaline Phosphatase Latest Ref Range: 39 - 117 IU/L 197 (H) 101   Albumin Latest Ref Range: 3.7 - 4.7 g/dL 2.5 (L) 3.7   Albumin/Globulin Ratio Latest Ref Range: 1.2 - 2.2   1.2   AST Latest Ref Range: 0 - 40 IU/L 65 (H) 25   ALT Latest Ref Range: 0 - 44 IU/L 65 (H) 22   Total Protein Latest Ref Range: 6.0 - 8.5 g/dL 7.5 6.8   Total Bilirubin Latest Ref Range: 0.0 - 1.2 mg/dL 1.0 0.6   GFR, Est Non African American Latest Ref Range: >59 mL/min/1.73 35 (L) 36 (L) 36 (L)  GFR, Est African American Latest Ref Range: >59  mL/min/1.73 41 (L) 42 (L) 41 (L)    Review of Systems  Constitution: Negative for decreased appetite, malaise/fatigue, weight gain and weight loss.  HENT: Negative for congestion.   Eyes: Negative for visual disturbance.  Cardiovascular: Positive for leg swelling. Negative for chest pain, dyspnea on exertion, palpitations and syncope.  Respiratory: Negative for shortness of breath.   Endocrine: Negative for cold intolerance.  Hematologic/Lymphatic: Does not bruise/bleed easily.  Skin: Negative for itching and rash.  Musculoskeletal: Negative for myalgias.  Gastrointestinal: Negative for abdominal pain, nausea and vomiting.  Genitourinary: Negative for dysuria.  Neurological: Negative for dizziness and weakness.  Psychiatric/Behavioral: The patient is not nervous/anxious.   All other systems reviewed and are negative.        Vitals:   10/15/18 1335  BP: 137/65  Pulse: 60   (Measured by the patient using a home BP monitor)   Observation/findings during video visit   Objective:    Physical Exam  Constitutional: He is oriented to person, place, and time. He appears well-developed and well-nourished. No distress.  Pulmonary/Chest: Effort normal.  Musculoskeletal:        General: Edema (1-2+, mostly in upper legs. He wears compression stockings) present.  Neurological: He is alert and oriented to person, place, and time.  Psychiatric: He has a normal mood and affect.  Nursing note and vitals reviewed.         Assessment & Recommendations:   73 year old African-American male  with coronary artery disease status post complex LAD PCI 03/2017, HFpEF, pulmonary hypertension, type II diabetes mellitus, hypertension, CKD III. Recent COVID negative, flu negative, multifocal pneumonia, sepsis, respiratory failure 07/2018, now recovering.  Leg edema/HFpEF: Resolved. Take Torsemide 20 mg in am, and only as needed in pm.   CAD: Stable. No current angina. Troponin elevation on  hospital admission in 07/2018 likely due to supply demand mismatch in the setting of septic shock. Continue aspirin, metoprolol succinate. Increase lipitor to 40 mg daily. Check lipid panel and CMP in 01/2019.   Pulmonary hypertension: WHO Grp II, possibly out of proportion. Continue managmenet with diuretics at this time.  Hypertension: Continue current therapy.   Moderate carotid stenoses: Asymptomatic. Continue risk factor modification. Repeat carotid ultrasound in 01/2019.  Anemia: Stable. Likely due to CKD. Continue iron supplementation. Follow p with PCP.  Follow up in 01/2019.  Nigel Mormon, MD [facility] Pager: [phone] Office: [phone] If no answer Cell [phone]

## 2018-10-20 ENCOUNTER — Other Ambulatory Visit: Payer: Self-pay

## 2018-10-20 DIAGNOSIS — I2510 Atherosclerotic heart disease of native coronary artery without angina pectoris: Secondary | ICD-10-CM

## 2018-10-20 MED ORDER — NITROGLYCERIN 0.4 MG SL SUBL: 0.4000 mg | SUBLINGUAL_TABLET | SUBLINGUAL | 3 refills | Status: DC | PRN

## 2018-10-26 ENCOUNTER — Other Ambulatory Visit: Payer: Self-pay

## 2018-10-26 DIAGNOSIS — I5032 Chronic diastolic (congestive) heart failure: Secondary | ICD-10-CM

## 2018-10-26 MED ORDER — TORSEMIDE 20 MG PO TABS: 20.0000 mg | ORAL_TABLET | Freq: Two times a day (BID) | ORAL | 1 refills | Status: DC

## 2018-11-10 ENCOUNTER — Other Ambulatory Visit: Payer: Self-pay

## 2018-11-10 ENCOUNTER — Ambulatory Visit (INDEPENDENT_AMBULATORY_CARE_PROVIDER_SITE_OTHER): Payer: Medicare Other | Admitting: Family

## 2018-11-10 ENCOUNTER — Encounter: Payer: Self-pay | Admitting: Family

## 2018-11-10 VITALS — Ht 74.0 in | Wt 220.0 lb

## 2018-11-10 DIAGNOSIS — M6701 Short Achilles tendon (acquired), right ankle: Secondary | ICD-10-CM

## 2018-11-10 DIAGNOSIS — E1142 Type 2 diabetes mellitus with diabetic polyneuropathy: Secondary | ICD-10-CM

## 2018-11-10 DIAGNOSIS — L97511 Non-pressure chronic ulcer of other part of right foot limited to breakdown of skin: Secondary | ICD-10-CM | POA: Diagnosis not present

## 2018-11-10 DIAGNOSIS — R600 Localized edema: Secondary | ICD-10-CM

## 2018-11-10 DIAGNOSIS — B351 Tinea unguium: Secondary | ICD-10-CM

## 2018-11-10 MED ORDER — ATORVASTATIN CALCIUM 40 MG PO TABS: 40.0000 mg | ORAL_TABLET | Freq: Every day | ORAL | 1 refills | Status: DC

## 2018-11-10 NOTE — Progress Notes (Signed)
Office Visit Note   Patient: David [name]           Date of Birth: 21-Jun-1945           MRN: [id number] Visit Date: 11/10/2018              Requested by: [name], MD [address] [place],  [place] [address] PCP: [name], MD  Chief Complaint  Patient presents with  . Right Foot - Pain      HPI: The patient is a 73 year old gentleman who is seen today in routine follow-up for onychomycotic nails x8 as well as wound beneath the right foot.  He is been regular shoewear full weightbearing with a four-point walker. Assessment & Plan: Visit Diagnoses:  No diagnosis found.  Plan: Trimmed onychomycotic nails of the right foot x3 and the left foot x5.  Also debrided callus over the right first metatarsal head.  He should continue to minimize weightbearing of the right foot as much as possible and continue his silver compression stockings.  He will follow-up in 4 weeks or sooner should he have difficulty in the interim.  Follow-Up Instructions: No follow-ups on file.   Ortho Exam  Patient is alert, oriented, no adenopathy, well-dressed, normal affect, normal respiratory effort. The right second toe amputation site is well-healed.  He has residual callus beneath the right first metatarsal head and this was debrided to healthy tissue.  There is a small underlying ulcer which is about 8 mm diameter x 2 mm depth without signs of infection or periwound cellulitis.  No drainage no odor.  Did trim onychomycotic nails of the right residual toes x3 as well as the left foot x5.  Patient is unable to safely trim his own nails.    He has mild peripheral edema.  He has palpable pedal pulses.  Imaging: No results found.   Labs: Lab Results  Component Value Date   HGBA1C 7.1 (H) 05/28/2018   HGBA1C 6.3 (H) 12/19/2017   HGBA1C 7.5 (H) 11/24/2017   ESRSEDRATE 73 (H) 02/07/2015   ESRSEDRATE 22 (H) 09/15/2013   CRP 8.2 (H) 02/07/2015   LABURIC 4.4 08/12/2018   REPTSTATUS 08/16/2018 FINAL 08/15/2018   GRAMSTAIN  08/07/2018    MODERATE WBC PRESENT, PREDOMINANTLY PMN NO ORGANISMS SEEN    CULT MULTIPLE SPECIES PRESENT, SUGGEST RECOLLECTION (A) 08/15/2018     Lab Results  Component Value Date   ALBUMIN 3.7 09/10/2018   ALBUMIN 2.5 (L) 08/15/2018   ALBUMIN 2.4 (L) 08/14/2018   LABURIC 4.4 08/12/2018    Body mass index is 28.25 kg/m.  Orders:  No orders of the defined types were placed in this encounter.  No orders of the defined types were placed in this encounter.    Procedures: No procedures performed  Clinical Data: No additional findings.  ROS:  All other systems negative, except as noted in the HPI. Review of Systems  Constitutional: Negative for chills and fever.  Cardiovascular: Negative for leg swelling.  Skin: Positive for wound. Negative for color change.    Objective: Vital Signs: Ht 6\' 2"  (1.88 m)   Wt 220 lb (99.8 kg)   BMI 28.25 kg/m   Specialty Comments:  No specialty comments available.  PMFS History: Patient Active Problem List   Diagnosis Date Noted  . Mixed hyperlipidemia 10/15/2018  . Bilateral leg edema 09/17/2018  . CKD (chronic kidney disease) stage 3, GFR 30-59 ml/min (HCC) 09/10/2018  . Anemia 09/10/2018  . Pain  and swelling of wrist, right 08/14/2018  . Bilateral carotid artery stenosis 07/30/2018  . Coronary artery disease involving native coronary artery of native heart without angina pectoris 07/30/2018  . Snoring 02/11/2018  . Chronic heart failure with preserved ejection fraction (HFpEF) ([place]) 12/22/2017  . At risk for adverse drug reaction 12/15/2017  . Chronic pain syndrome 12/15/2017  . Status post coronary artery stent placement   . Multifocal pneumonia 03/30/2017  . Insulin-requiring or dependent type II diabetes mellitus ([place]) 03/29/2017  . Acute on chronic respiratory failure with hypoxia ([place])   . Pulmonary congestion   . Acquired contracture of Achilles tendon, right  08/19/2016  . Amputated great toe, right ([place]) 07/18/2016  . Onychomycosis 05/29/2016  . Diabetic polyneuropathy associated with type 2 diabetes mellitus ([place]) 04/09/2016  . Right foot ulcer, limited to breakdown of skin ([place]) 04/09/2016  . Unspecified hereditary and idiopathic peripheral neuropathy 09/15/2013  . Malaise 08/14/2013  . Essential hypertension 08/14/2013  . Chronic back pain 08/14/2013  . Glaucoma 08/14/2013  . Headache 08/14/2013   Past Medical History:  Diagnosis Date  . Arthritis   . CAD in native artery    s/p stent in 11/18  . CHF (congestive heart failure) ([place])    normal echo in 11/18  . CKD (chronic kidney disease)   . DDD (degenerative disc disease), cervical   . Diabetes mellitus with complication (HCC)    Type II  . Diabetic neuropathy ([place])   . Diabetic retinopathy ([place])   . GERD (gastroesophageal reflux disease)   . Glaucoma   . Hypertension     Family History  Problem Relation Age of Onset  . Diabetes Other   . Hyperlipidemia Other   . Hypertension Other   . Stroke Other   . Alzheimer's disease Other   . Thyroid disease Mother   . Diabetes Mellitus II Father   . Alzheimer's disease Father     Past Surgical History:  Procedure Laterality Date  . AMPUTATION Right 07/09/2016   Procedure: Right Great Toe Amputation at Metatarsophalangeal Joint;  Surgeon: [name], MD;  Location: [place];  Service: Orthopedics;  Laterality: Right;  . AMPUTATION Right 05/28/2018   Procedure: RIGHT SECOND TOE AMPUTATION;  Surgeon: [name], MD;  Location: [place];  Service: Orthopedics;  Laterality: Right;  . BACK SURGERY     4  . CARDIAC CATHETERIZATION    . CORONARY STENT INTERVENTION N/A 04/06/2017   Procedure: CORONARY STENT INTERVENTION;  Surgeon: Nigel Mormon, MD;  Location: [place] [facility];  Service: Cardiovascular;  Laterality: N/A;  . CORONARY STENT INTERVENTION N/A 04/07/2017   Procedure: CORONARY STENT INTERVENTION;  Surgeon:  Nigel Mormon, MD;  Location: [facility] [facility];  Service: Cardiovascular;  Laterality: N/A;  . CYST EXCISION     on Back  . ENDOTRACHEAL INTUBATION EMERGENT  08/07/2018      . JOINT REPLACEMENT    . LAPAROSCOPIC ABDOMINAL EXPLORATION N/A 11/26/2017   Procedure: LAPAROSCOPIC ABDOMINAL EXPLORATION, DRAINAGE OF APPENDICEAL ABCESS. PLACEMENT OF DRAIN;  Surgeon: Alphonsa Overall, MD;  Location: [place];  Service: General;  Laterality: N/A;  . RIGHT/LEFT HEART CATH AND CORONARY ANGIOGRAPHY N/A 04/06/2017   Procedure: RIGHT/LEFT HEART CATH AND CORONARY ANGIOGRAPHY;  Surgeon: Nigel Mormon, MD;  Location: [facility];  Service: Cardiovascular;  Laterality: N/A;  . TOE SURGERY  05/2018   Left   . TOTAL HIP ARTHROPLASTY     Right    Social History  Occupational History  . Occupation: Retired  Tobacco Use  . Smoking status: Never Smoker  . Smokeless tobacco: Never Used  Substance and Sexual Activity  . Alcohol use: Yes  . Drug use: No  . Sexual activity: Yes

## 2018-11-21 IMAGING — DX DG CHEST 1V PORT
1 series · 1 of 1 positions shown · non-contrast
Comparison: 12/19/2017

CLINICAL DATA: Acute on chronic heart failure

EXAM:
PORTABLE CHEST 1 VIEW

[chest ap]
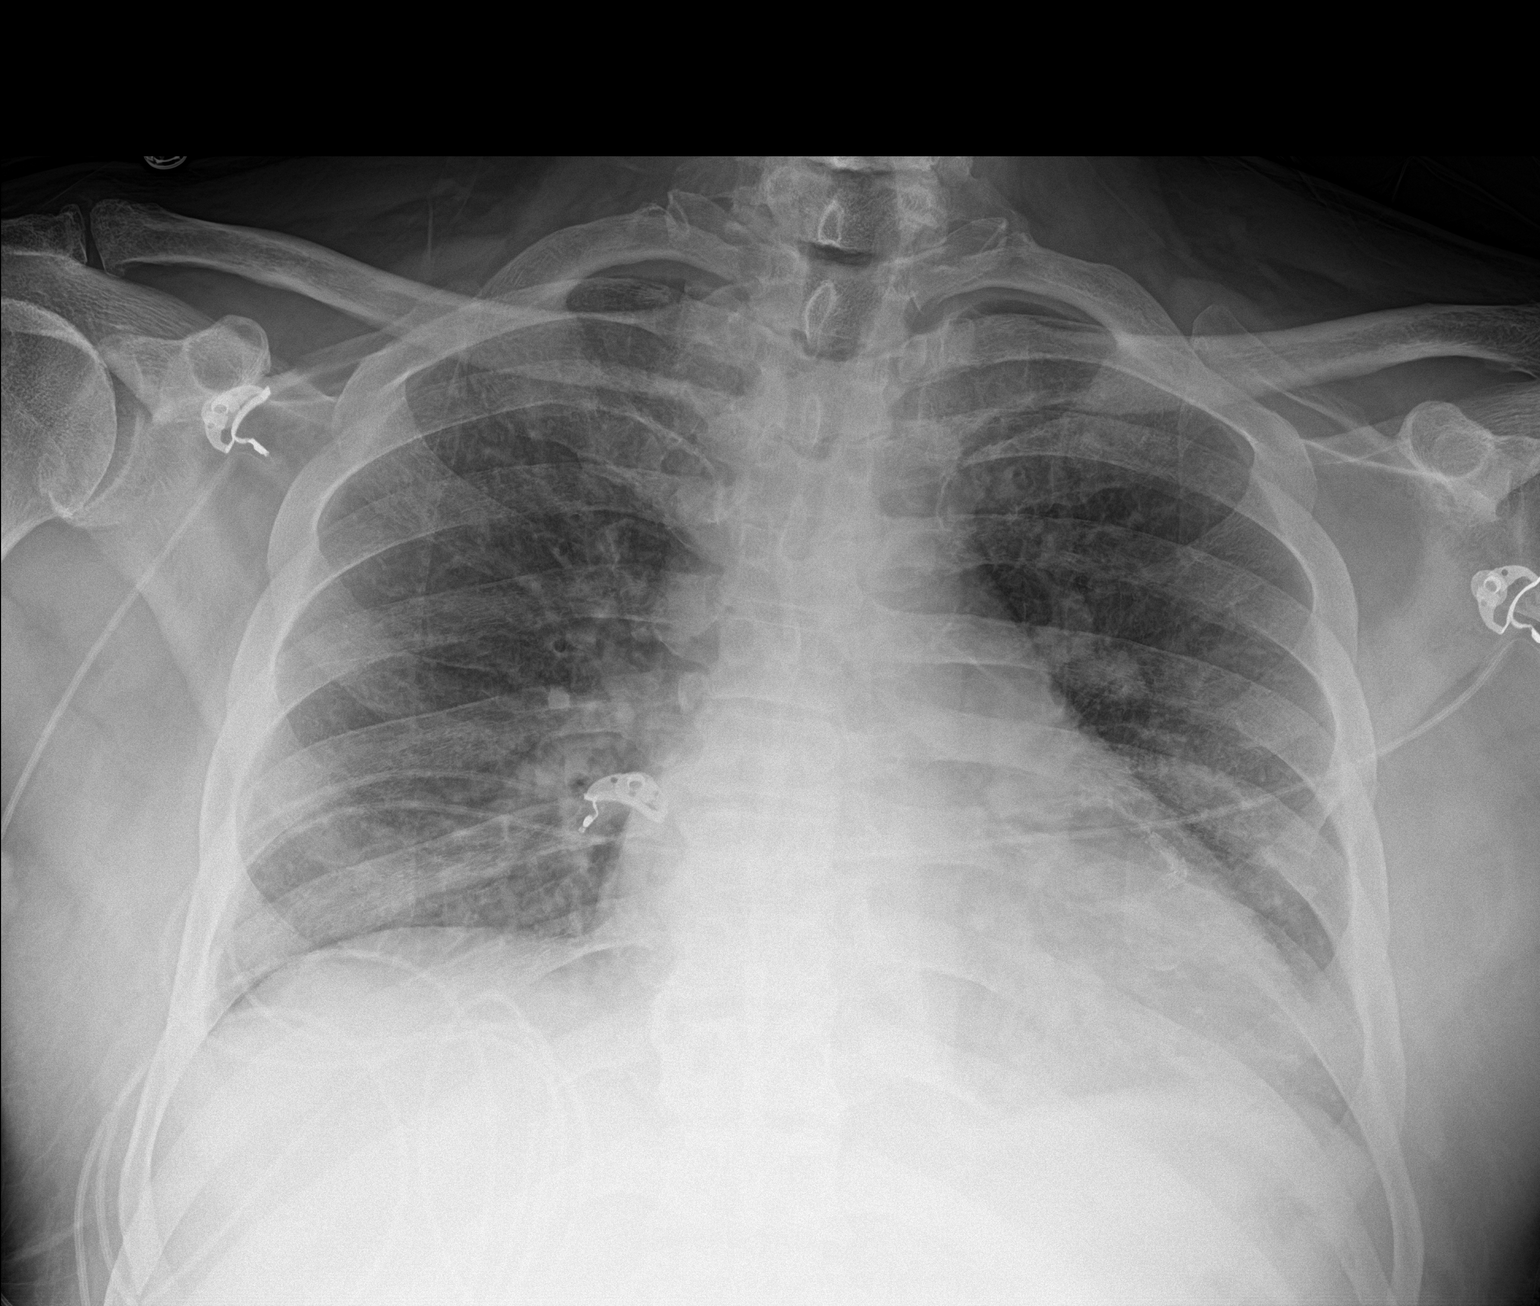

[1 of 1 positions shown; findings below may reference images not displayed]

FINDINGS: Cardiac shadow is stable. Mild vascular congestion remains although
improved when compare with the prior study. No focal confluent
infiltrate is seen. No bony abnormality is noted.
IMPRESSION: Improving vascular congestion when compared with the prior study.

## 2018-12-06 ENCOUNTER — Other Ambulatory Visit: Payer: Self-pay | Admitting: Cardiology

## 2018-12-14 ENCOUNTER — Encounter: Payer: Self-pay | Admitting: Family

## 2018-12-14 ENCOUNTER — Ambulatory Visit (INDEPENDENT_AMBULATORY_CARE_PROVIDER_SITE_OTHER): Payer: Medicare Other | Admitting: Family

## 2018-12-14 VITALS — Ht 74.0 in | Wt 220.0 lb

## 2018-12-14 DIAGNOSIS — L97514 Non-pressure chronic ulcer of other part of right foot with necrosis of bone: Secondary | ICD-10-CM

## 2018-12-14 DIAGNOSIS — L97511 Non-pressure chronic ulcer of other part of right foot limited to breakdown of skin: Secondary | ICD-10-CM | POA: Diagnosis not present

## 2018-12-14 DIAGNOSIS — Z89411 Acquired absence of right great toe: Secondary | ICD-10-CM

## 2018-12-14 DIAGNOSIS — B351 Tinea unguium: Secondary | ICD-10-CM

## 2018-12-14 DIAGNOSIS — E1142 Type 2 diabetes mellitus with diabetic polyneuropathy: Secondary | ICD-10-CM

## 2018-12-14 DIAGNOSIS — S98111A Complete traumatic amputation of right great toe, initial encounter: Secondary | ICD-10-CM

## 2018-12-14 NOTE — Progress Notes (Signed)
Office Visit Note   Patient: David [name]           Date of Birth: 1946-02-28           MRN: [id number] Visit Date: 12/14/2018              Requested by: [name], MD [address] [place],  [place] [address] PCP: [name], MD  Chief Complaint  Patient presents with  . Left Foot - Follow-up  . Right Foot - Follow-up      HPI: The patient is a 73 year old gentleman seen today in routine follow-up.  He does have a new area to the dorsum of his left foot he is concerned about this is dark in color.  There is no open area he has not had any fevers or chills.   States that he frequently hits his right shin with his cane today has a traumatic ulcer to his shin as well.  She  Assessment & Plan: Visit Diagnoses:  No diagnosis found.  Plan: He will continue with his compression garments daily.  Reassurance for the dark area to the dorsum of his left foot.  He will wear his medical compression stocking with direct skin contact of the traumatic ulcer on the right.    He will follow-up in 4 weeks or sooner should he have difficulty in the interim.  Follow-Up Instructions: No follow-ups on file.   Ortho Exam  Patient is alert, oriented, no adenopathy, well-dressed, normal affect, normal respiratory effort.  On examination of the right lower extremity the second toe and great toe amputation sites are well-healed there is callus ulceration beneath the first metatarsal head today this was debrided with a 10 blade knife back to viable tissue.  Underlying ulcer is 1 cm in diameter and 2 mm deep there is no surrounding erythema no drainage no signs of infection.  Does also have a centimeter in diameter traumatic ulceration over his right shin this is superficial no active drainage filled in with granulation. The left lower extremity is without edema there is a 3 mm in diameter dark area where he previously had a blister over the dorsum of his foot this is not open  there is no drainage no erythema no sign of infection  He has palpable pedal pulses.  Imaging: No results found.   Labs: Lab Results  Component Value Date   HGBA1C 7.1 (H) 05/28/2018   HGBA1C 6.3 (H) 12/19/2017   HGBA1C 7.5 (H) 11/24/2017   ESRSEDRATE 73 (H) 02/07/2015   ESRSEDRATE 22 (H) 09/15/2013   CRP 8.2 (H) 02/07/2015   LABURIC 4.4 08/12/2018   REPTSTATUS 08/16/2018 FINAL 08/15/2018   GRAMSTAIN  08/07/2018    MODERATE WBC PRESENT, PREDOMINANTLY PMN NO ORGANISMS SEEN    CULT MULTIPLE SPECIES PRESENT, SUGGEST RECOLLECTION (A) 08/15/2018     Lab Results  Component Value Date   ALBUMIN 3.7 09/10/2018   ALBUMIN 2.5 (L) 08/15/2018   ALBUMIN 2.4 (L) 08/14/2018   LABURIC 4.4 08/12/2018    Body mass index is 28.25 kg/m.  Orders:  No orders of the defined types were placed in this encounter.  No orders of the defined types were placed in this encounter.    Procedures: No procedures performed  Clinical Data: No additional findings.  ROS:  All other systems negative, except as noted in the HPI. Review of Systems  Constitutional: Negative for chills and fever.  Cardiovascular: Negative for leg swelling.  Skin: Positive for  wound. Negative for color change.    Objective: Vital Signs: Ht 6\' 2"  (1.88 m)   Wt 220 lb (99.8 kg)   BMI 28.25 kg/m   Specialty Comments:  No specialty comments available.  PMFS History: Patient Active Problem List   Diagnosis Date Noted  . Mixed hyperlipidemia 10/15/2018  . Bilateral leg edema 09/17/2018  . CKD (chronic kidney disease) stage 3, GFR 30-59 ml/min (HCC) 09/10/2018  . Anemia 09/10/2018  . Pain and swelling of wrist, right 08/14/2018  . Bilateral carotid artery stenosis 07/30/2018  . Coronary artery disease involving native coronary artery of native heart without angina pectoris 07/30/2018  . Snoring 02/11/2018  . Chronic heart failure with preserved ejection fraction (HFpEF) ([place]) 12/22/2017  . At risk for  adverse drug reaction 12/15/2017  . Chronic pain syndrome 12/15/2017  . Status post coronary artery stent placement   . Multifocal pneumonia 03/30/2017  . Insulin-requiring or dependent type II diabetes mellitus ([place]) 03/29/2017  . Acute on chronic respiratory failure with hypoxia ([place])   . Pulmonary congestion   . Acquired contracture of Achilles tendon, right 08/19/2016  . Amputated great toe, right ([place]) 07/18/2016  . Onychomycosis 05/29/2016  . Diabetic polyneuropathy associated with type 2 diabetes mellitus ([place]) 04/09/2016  . Right foot ulcer, limited to breakdown of skin ([place]) 04/09/2016  . Unspecified hereditary and idiopathic peripheral neuropathy 09/15/2013  . Malaise 08/14/2013  . Essential hypertension 08/14/2013  . Chronic back pain 08/14/2013  . Glaucoma 08/14/2013  . Headache 08/14/2013   Past Medical History:  Diagnosis Date  . Arthritis   . CAD in native artery    s/p stent in 11/18  . CHF (congestive heart failure) ([place])    normal echo in 11/18  . CKD (chronic kidney disease)   . DDD (degenerative disc disease), cervical   . Diabetes mellitus with complication (HCC)    Type II  . Diabetic neuropathy ([place])   . Diabetic retinopathy ([place])   . GERD (gastroesophageal reflux disease)   . Glaucoma   . Hypertension     Family History  Problem Relation Age of Onset  . Diabetes Other   . Hyperlipidemia Other   . Hypertension Other   . Stroke Other   . Alzheimer's disease Other   . Thyroid disease Mother   . Diabetes Mellitus II Father   . Alzheimer's disease Father     Past Surgical History:  Procedure Laterality Date  . AMPUTATION Right 07/09/2016   Procedure: Right Great Toe Amputation at Metatarsophalangeal Joint;  Surgeon: [name], MD;  Location: [facility];  Service: Orthopedics;  Laterality: Right;  . AMPUTATION Right 05/28/2018   Procedure: RIGHT SECOND TOE AMPUTATION;  Surgeon: [name], MD;  Location: [facility];  Service: Orthopedics;  Laterality:  Right;  . BACK SURGERY     4  . CARDIAC CATHETERIZATION    . CORONARY STENT INTERVENTION N/A 04/06/2017   Procedure: CORONARY STENT INTERVENTION;  Surgeon: Nigel Mormon, MD;  Location: [facility];  Service: Cardiovascular;  Laterality: N/A;  . CORONARY STENT INTERVENTION N/A 04/07/2017   Procedure: CORONARY STENT INTERVENTION;  Surgeon: Nigel Mormon, MD;  Location: [facility];  Service: Cardiovascular;  Laterality: N/A;  . CYST EXCISION     on Back  . ENDOTRACHEAL INTUBATION EMERGENT  08/07/2018      . JOINT REPLACEMENT    . LAPAROSCOPIC ABDOMINAL EXPLORATION N/A 11/26/2017   Procedure: LAPAROSCOPIC ABDOMINAL EXPLORATION, DRAINAGE OF APPENDICEAL ABCESS. PLACEMENT  OF DRAIN;  Surgeon: Alphonsa Overall, MD;  Location: [facility];  Service: General;  Laterality: N/A;  . RIGHT/LEFT HEART CATH AND CORONARY ANGIOGRAPHY N/A 04/06/2017   Procedure: RIGHT/LEFT HEART CATH AND CORONARY ANGIOGRAPHY;  Surgeon: Nigel Mormon, MD;  Location: [facility];  Service: Cardiovascular;  Laterality: N/A;  . TOE SURGERY  05/2018   Left   . TOTAL HIP ARTHROPLASTY     Right    Social History   Occupational History  . Occupation: Retired  Tobacco Use  . Smoking status: Never Smoker  . Smokeless tobacco: Never Used  Substance and Sexual Activity  . Alcohol use: Yes  . Drug use: No  . Sexual activity: Yes

## 2019-01-11 ENCOUNTER — Encounter: Payer: Self-pay | Admitting: Family

## 2019-01-11 ENCOUNTER — Ambulatory Visit (INDEPENDENT_AMBULATORY_CARE_PROVIDER_SITE_OTHER): Payer: Medicare Other | Admitting: Family

## 2019-01-11 VITALS — Ht 74.0 in | Wt 220.0 lb

## 2019-01-11 DIAGNOSIS — B351 Tinea unguium: Secondary | ICD-10-CM | POA: Diagnosis not present

## 2019-01-11 DIAGNOSIS — E1142 Type 2 diabetes mellitus with diabetic polyneuropathy: Secondary | ICD-10-CM | POA: Diagnosis not present

## 2019-01-11 DIAGNOSIS — L97511 Non-pressure chronic ulcer of other part of right foot limited to breakdown of skin: Secondary | ICD-10-CM | POA: Diagnosis not present

## 2019-01-11 DIAGNOSIS — M6701 Short Achilles tendon (acquired), right ankle: Secondary | ICD-10-CM

## 2019-01-11 NOTE — Progress Notes (Signed)
Office Visit Note   Patient: David [name]           Date of Birth: [birth year]           MRN: [id number] Visit Date: 01/11/2019              Requested by: [name], MD [address] [place],  [place] [address] PCP: [name], MD  No chief complaint on file.     HPI: The patient is a 73 year old gentleman seen today in follow-up, concerned for worsening of right foot ulcer.    Increase in size, depth and pain of his ulcer. No fever, chills or drainage. Has been wearing Vive compression hose with direct skin contact, bilaterally.  Assessment & Plan: Visit Diagnoses:  1. Acquired contracture of Achilles tendon, right   2. Onychomycosis   3. Right foot ulcer, limited to breakdown of skin ([place])   4. Diabetic polyneuropathy associated with type 2 diabetes mellitus ([place])     Plan: He will continue with his compression garments daily.  Mupirocin dressings to right foot ulcer. Encouraged use of custom orthotics and pressure relieving padding. Minimize weight bearing and pressure to wound area. He will follow-up in 4 weeks or sooner should he have difficulty in the interim.  Follow-Up Instructions: Return in about 4 weeks (around 02/08/2019).   Ortho Exam  Patient is alert, oriented, no adenopathy, well-dressed, normal affect, normal respiratory effort.  On examination of the right lower extremity, has ulceration beneath the first metatarsal head today this was debrided with a 10 blade knife back to viable tissue.  Surrounding callus and nonviable tissue to ulcer, was debrided with a 10 blade. Underlying ulcer is 1 cm in diameter and 5 mm deep there is no surrounding erythema no drainage no signs of infection.  He has palpable pedal pulses.  Imaging: No results found.   Labs: Lab Results  Component Value Date   HGBA1C 7.1 (H) 05/28/2018   HGBA1C 6.3 (H) 12/19/2017   HGBA1C 7.5 (H) 11/24/2017   ESRSEDRATE 73 (H) 02/07/2015   ESRSEDRATE 22 (H)  09/15/2013   CRP 8.2 (H) 02/07/2015   LABURIC 4.4 08/12/2018   REPTSTATUS 08/16/2018 FINAL 08/15/2018   GRAMSTAIN  08/07/2018    MODERATE WBC PRESENT, PREDOMINANTLY PMN NO ORGANISMS SEEN    CULT MULTIPLE SPECIES PRESENT, SUGGEST RECOLLECTION (A) 08/15/2018     Lab Results  Component Value Date   ALBUMIN 3.7 09/10/2018   ALBUMIN 2.5 (L) 08/15/2018   ALBUMIN 2.4 (L) 08/14/2018   LABURIC 4.4 08/12/2018    Body mass index is 28.25 kg/m.  Orders:  No orders of the defined types were placed in this encounter.  No orders of the defined types were placed in this encounter.    Procedures: No procedures performed  Clinical Data: No additional findings.  ROS:  All other systems negative, except as noted in the HPI. Review of Systems  Constitutional: Negative for chills and fever.  Cardiovascular: Negative for leg swelling.  Skin: Positive for wound. Negative for color change.    Objective: Vital Signs: Ht 6\' 2"  (1.88 m)   Wt 220 lb (99.8 kg)   BMI 28.25 kg/m   Specialty Comments:  No specialty comments available.  PMFS History: Patient Active Problem List   Diagnosis Date Noted  . Mixed hyperlipidemia 10/15/2018  . Bilateral leg edema 09/17/2018  . CKD (chronic kidney disease) stage 3, GFR 30-59 ml/min (HCC) 09/10/2018  . Anemia 09/10/2018  . Pain  and swelling of wrist, right 08/14/2018  . Bilateral carotid artery stenosis 07/30/2018  . Coronary artery disease involving native coronary artery of native heart without angina pectoris 07/30/2018  . Snoring 02/11/2018  . Chronic heart failure with preserved ejection fraction (HFpEF) ([place]) 12/22/2017  . At risk for adverse drug reaction 12/15/2017  . Chronic pain syndrome 12/15/2017  . Status post coronary artery stent placement   . Multifocal pneumonia 03/30/2017  . Insulin-requiring or dependent type II diabetes mellitus ([place]) 03/29/2017  . Acute on chronic respiratory failure with hypoxia ([place])   . Pulmonary  congestion   . Acquired contracture of Achilles tendon, right 08/19/2016  . Amputated great toe, right ([place]) 07/18/2016  . Onychomycosis 05/29/2016  . Diabetic polyneuropathy associated with type 2 diabetes mellitus ([place]) 04/09/2016  . Right foot ulcer, limited to breakdown of skin ([place]) 04/09/2016  . Unspecified hereditary and idiopathic peripheral neuropathy 09/15/2013  . Malaise 08/14/2013  . Essential hypertension 08/14/2013  . Chronic back pain 08/14/2013  . Glaucoma 08/14/2013  . Headache 08/14/2013   Past Medical History:  Diagnosis Date  . Arthritis   . CAD in native artery    s/p stent in 11/18  . CHF (congestive heart failure) ([place])    normal echo in 11/18  . CKD (chronic kidney disease)   . DDD (degenerative disc disease), cervical   . Diabetes mellitus with complication (HCC)    Type II  . Diabetic neuropathy ([place])   . Diabetic retinopathy ([place])   . GERD (gastroesophageal reflux disease)   . Glaucoma   . Hypertension     Family History  Problem Relation Age of Onset  . Diabetes Other   . Hyperlipidemia Other   . Hypertension Other   . Stroke Other   . Alzheimer's disease Other   . Thyroid disease Mother   . Diabetes Mellitus II Father   . Alzheimer's disease Father     Past Surgical History:  Procedure Laterality Date  . AMPUTATION Right 07/09/2016   Procedure: Right Great Toe Amputation at Metatarsophalangeal Joint;  Surgeon: [name], MD;  Location: [facility];  Service: Orthopedics;  Laterality: Right;  . AMPUTATION Right 05/28/2018   Procedure: RIGHT SECOND TOE AMPUTATION;  Surgeon: [name], MD;  Location: [facility];  Service: Orthopedics;  Laterality: Right;  . BACK SURGERY     4  . CARDIAC CATHETERIZATION    . CORONARY STENT INTERVENTION N/A 04/06/2017   Procedure: CORONARY STENT INTERVENTION;  Surgeon: Nigel Mormon, MD;  Location: [facility];  Service: Cardiovascular;  Laterality: N/A;  . CORONARY STENT INTERVENTION N/A  04/07/2017   Procedure: CORONARY STENT INTERVENTION;  Surgeon: Nigel Mormon, MD;  Location: [facility];  Service: Cardiovascular;  Laterality: N/A;  . CYST EXCISION     on Back  . ENDOTRACHEAL INTUBATION EMERGENT  08/07/2018      . JOINT REPLACEMENT    . LAPAROSCOPIC ABDOMINAL EXPLORATION N/A 11/26/2017   Procedure: LAPAROSCOPIC ABDOMINAL EXPLORATION, DRAINAGE OF APPENDICEAL ABCESS. PLACEMENT OF DRAIN;  Surgeon: Alphonsa Overall, MD;  Location: [facility];  Service: General;  Laterality: N/A;  . RIGHT/LEFT HEART CATH AND CORONARY ANGIOGRAPHY N/A 04/06/2017   Procedure: RIGHT/LEFT HEART CATH AND CORONARY ANGIOGRAPHY;  Surgeon: Nigel Mormon, MD;  Location: [facility];  Service: Cardiovascular;  Laterality: N/A;  . TOE SURGERY  05/2018   Left   . TOTAL HIP ARTHROPLASTY     Right    Social History  Occupational History  . Occupation: Retired  Tobacco Use  . Smoking status: Never Smoker  . Smokeless tobacco: Never Used  Substance and Sexual Activity  . Alcohol use: Yes  . Drug use: No  . Sexual activity: Yes

## 2019-01-18 ENCOUNTER — Other Ambulatory Visit: Payer: Self-pay | Admitting: Cardiology

## 2019-01-18 ENCOUNTER — Ambulatory Visit: Payer: Medicare Other | Admitting: Family

## 2019-01-18 DIAGNOSIS — I5030 Unspecified diastolic (congestive) heart failure: Secondary | ICD-10-CM

## 2019-02-01 ENCOUNTER — Other Ambulatory Visit: Payer: Self-pay

## 2019-02-01 ENCOUNTER — Ambulatory Visit (INDEPENDENT_AMBULATORY_CARE_PROVIDER_SITE_OTHER): Payer: Medicare Other

## 2019-02-01 DIAGNOSIS — I6523 Occlusion and stenosis of bilateral carotid arteries: Secondary | ICD-10-CM | POA: Diagnosis not present

## 2019-02-06 ENCOUNTER — Other Ambulatory Visit: Payer: Self-pay | Admitting: Cardiology

## 2019-02-06 DIAGNOSIS — I6523 Occlusion and stenosis of bilateral carotid arteries: Secondary | ICD-10-CM

## 2019-02-06 NOTE — Progress Notes (Deleted)
Virtual Visit via Video Note   Subjective:   David [name], male    DOB: 11-07-1945, 73 y.o.   MRN: [id number]   I connected with the patient on 02/06/19 by a video enabled telemedicine application and verified that I am speaking with the correct person using two identifiers.     I discussed the limitations of evaluation and management by telemedicine and the availability of in person appointments. The patient expressed understanding and agreed to proceed.   This visit type was conducted due to national recommendations for restrictions regarding the COVID-19 Pandemic (e.g. social distancing).  This format is felt to be most appropriate for this patient at this time.  All issues noted in this document were discussed and addressed.  No physical exam was performed (except for noted visual exam findings with Tele health visits).  The patient has consented to conduct a Tele health visit and understands insurance will be billed.      Chief complaint:  Leg swelling   HPI  73 year old African-American male with coronary artery disease status post complex LAD PCI 03/2017, HFpEF, pulmonary hypertension, type II diabetes mellitus, hypertension, CKD III.  He is doing well. Leg swelling has resolved. He is more energetic and doing more activity around the house. He denies chest pain, shortness of breath, palpitations, leg edema, orthopnea, PND, TIA/syncope.  Past Medical History:  Diagnosis Date  . Arthritis   . CAD in native artery    s/p stent in 11/18  . CHF (congestive heart failure) ([place])    normal echo in 11/18  . CKD (chronic kidney disease)   . DDD (degenerative disc disease), cervical   . Diabetes mellitus with complication (HCC)    Type II  . Diabetic neuropathy ([place])   . Diabetic retinopathy ([place])   . GERD (gastroesophageal reflux disease)   . Glaucoma   . Hypertension      Past Surgical History:  Procedure Laterality Date  . AMPUTATION Right 07/09/2016   Procedure:  Right Great Toe Amputation at Metatarsophalangeal Joint;  Surgeon: [name], MD;  Location: [facility];  Service: Orthopedics;  Laterality: Right;  . AMPUTATION Right 05/28/2018   Procedure: RIGHT SECOND TOE AMPUTATION;  Surgeon: [name], MD;  Location: [facility];  Service: Orthopedics;  Laterality: Right;  . BACK SURGERY     4  . CARDIAC CATHETERIZATION    . CORONARY STENT INTERVENTION N/A 04/06/2017   Procedure: CORONARY STENT INTERVENTION;  Surgeon: Nigel Mormon, MD;  Location: [facility];  Service: Cardiovascular;  Laterality: N/A;  . CORONARY STENT INTERVENTION N/A 04/07/2017   Procedure: CORONARY STENT INTERVENTION;  Surgeon: Nigel Mormon, MD;  Location: [place];  Service: Cardiovascular;  Laterality: N/A;  . CYST EXCISION     on Back  . ENDOTRACHEAL INTUBATION EMERGENT  08/07/2018      . JOINT REPLACEMENT    . LAPAROSCOPIC ABDOMINAL EXPLORATION N/A 11/26/2017   Procedure: LAPAROSCOPIC ABDOMINAL EXPLORATION, DRAINAGE OF APPENDICEAL ABCESS. PLACEMENT OF DRAIN;  Surgeon: Alphonsa Overall, MD;  Location: [facility];  Service: General;  Laterality: N/A;  . RIGHT/LEFT HEART CATH AND CORONARY ANGIOGRAPHY N/A 04/06/2017   Procedure: RIGHT/LEFT HEART CATH AND CORONARY ANGIOGRAPHY;  Surgeon: Nigel Mormon, MD;  Location: [facility];  Service: Cardiovascular;  Laterality: N/A;  . TOE SURGERY  05/2018   Left   . TOTAL HIP ARTHROPLASTY     Right      Social History  Socioeconomic History  . Marital status: Married    Spouse name: Not on file  . Number of children: 2  . Years of education: 70  . Highest education level: Master's degree (e.g., MA, MS, MEng, MEd, MSW, MBA)  Occupational History  . Occupation: Retired  Scientific laboratory technician  . Financial resource strain: Not hard at all  . Food insecurity    Worry: Not on file    Inability: Not on file  . Transportation needs    Medical: Not on file    Non-medical: Not on file  Tobacco Use  .  Smoking status: Never Smoker  . Smokeless tobacco: Never Used  Substance and Sexual Activity  . Alcohol use: Yes  . Drug use: No  . Sexual activity: Yes  Lifestyle  . Physical activity    Days per week: 0 days    Minutes per session: 0 min  . Stress: Not at all  Relationships  . Social Herbalist on phone: Twice a week    Gets together: Twice a week    Attends religious service: 1 to 4 times per year    Active member of club or organization: Yes    Attends meetings of clubs or organizations: 1 to 4 times per year    Relationship status: Married  . Intimate partner violence    Fear of current or ex partner: No    Emotionally abused: No    Physically abused: No    Forced sexual activity: No  Other Topics Concern  . Not on file  Social History Narrative  . Not on file     Family History  Problem Relation Age of Onset  . Diabetes Other   . Hyperlipidemia Other   . Hypertension Other   . Stroke Other   . Alzheimer's disease Other   . Thyroid disease Mother   . Diabetes Mellitus II Father   . Alzheimer's disease Father      Current Outpatient Medications on File Prior to Visit  Medication Sig Dispense Refill  . albuterol (PROVENTIL HFA;VENTOLIN HFA) 108 (90 Base) MCG/ACT inhaler Inhale 2 puffs into the lungs every 6 (six) hours as needed for wheezing or shortness of breath. 1 Inhaler 2  . amLODipine (NORVASC) 5 MG tablet Take 1 tablet (5 mg total) by mouth daily. 90 tablet 1  . aspirin EC 81 MG tablet Take 81 mg by mouth daily.    Marland Kitchen atorvastatin (LIPITOR) 40 MG tablet Take 1 tablet (40 mg total) by mouth daily for 90 doses. 90 tablet 1  . BIDIL 20-37.5 MG tablet TAKE 2 TABLETS BY MOUTH 2 TIMES A DAY. 120 tablet 4  . dicyclomine (BENTYL) 10 MG capsule Take 1 capsule (10 mg total) by mouth 4 (four) times daily -  before meals and at bedtime. (Patient taking differently: Take 10 mg by mouth 3 (three) times daily before meals. ) 10 capsule 0  . dorzolamide-timolol  (COSOPT) 22.3-6.8 MG/ML ophthalmic solution Place 1 drop into both eyes 2 (two) times daily.  98  . Ensure Max Protein (ENSURE MAX PROTEIN) LIQD Take 330 mLs (11 oz total) by mouth 2 (two) times daily. 330 mL 0  . ferrous sulfate 325 (65 FE) MG tablet Take 325 mg by mouth daily with breakfast.    . gabapentin (NEURONTIN) 600 MG tablet Take 600 mg by mouth 2 (two) times daily.     Marland Kitchen guaiFENesin (MUCINEX) 600 MG 12 hr tablet Take 1 tablet (600 mg total)  by mouth 2 (two) times daily. 10 tablet 0  . HUMALOG KWIKPEN 100 UNIT/ML KiwkPen Inject 4 Units into the skin 3 (three) times daily.  4  . HYDROcodone-acetaminophen (NORCO) 10-325 MG tablet Take 1 tablet by mouth 2 (two) times a day.     . Insulin Detemir (LEVEMIR FLEXTOUCH) 100 UNIT/ML Pen Inject 20 Units into the skin daily. (Patient taking differently: Inject 30 Units into the skin daily. )    . methocarbamol (ROBAXIN) 750 MG tablet Take 1 tablet (750 mg total) by mouth every 6 (six) hours as needed for muscle spasms. 30 tablet 0  . metoprolol succinate (TOPROL-XL) 100 MG 24 hr tablet TAKE 1 TABLET BY MOUTH EVERY DAY 30 tablet 5  . Multiple Vitamin (MULTIVITAMIN WITH MINERALS) TABS tablet Take 1 tablet by mouth daily. 30 tablet 0  . mupirocin ointment (BACTROBAN) 2 % Apply 1 application topically daily. 22 g 0  . nitroGLYCERIN (NITROSTAT) 0.4 MG SL tablet Place 1 tablet (0.4 mg total) under the tongue every 5 (five) minutes as needed for chest pain. 30 tablet 3  . saccharomyces boulardii (FLORASTOR) 250 MG capsule Take 1 capsule (250 mg total) by mouth 2 (two) times daily.    Marland Kitchen torsemide (DEMADEX) 20 MG tablet Take 1 tablet (20 mg total) by mouth 2 (two) times daily. 180 tablet 1  . traZODone (DESYREL) 50 MG tablet Take 100 mg by mouth at bedtime.      No current facility-administered medications on file prior to visit.     Cardiovascular studies:  Carotid artery duplex  02/11/19: Stenosis in the right internal carotid artery (16-49%).  Stenosis in the right external carotid artery (<50%). Stenosis in the left internal carotid artery (50-69%). Stenosis in the left external carotid artery (<50%). Antegrade right vertebral artery flow. Antegrade left vertebral artery flow. Compared to 07/20/2018, left ICA velocity previously suggested >70% stenosis. Follow up in six months is appropriate if clinically indicated.  Hospital Echocardiogram 07/19/2018:  1. The left ventricle has low normal systolic function, with an ejection fraction of 50-55%. There is concentric left ventricular hypertrophy. No evidence of left ventricular regional wall motion abnormalities. Diastolic function not assessed.  2. The right ventricle has normal systolc function. There is no increase in right ventricular wall thickness.  3. Mild tricuspid regurgitation. This is a limited study and inadequate to assess RV systolic pressure.  4. Unlike previous study in 12/2017, pulmonary hypertension and diastolic dysfunction not adequately evaluated in this limited study. No other significant change noted.  EKG 07/30/2018: Sinus rhythm 70 bpm First degree A-V block  Left atrial enlargement.  Nonspecific T-abnormality.   Coronary angiography 04/06/2017: Successful complex PTCA and overlapping stents mid LAD Complexity due to severe calcification and severe tortuosity Resolute 3.0 X 18 mm Synergy 3.0 X 16 mm Residual 10% unde expansion of proximal stent  dLAD 2.75 X 26 mm Resolure DES  Recent labs:  Results for BRISON, WIST (MRN [id number]) as of 10/15/2018 13:50  Ref. Range 08/15/2018 03:17 09/10/2018 15:26 10/12/2018 [id number]  BASIC METABOLIC PANEL Unknown   Rpt (A)  COMPREHENSIVE METABOLIC PANEL Unknown Rpt (A) Rpt (A)   Sodium Latest Ref Range: 134 - 144 mmol/L 133 (L) 140 139  Potassium Latest Ref Range: 3.5 - 5.2 mmol/L 4.4 5.2 4.8  Chloride Latest Ref Range: 96 - 106 mmol/L 101 100 102  CO2 Latest Ref Range: 20 - 29 mmol/L 23 24 24   Glucose Latest Ref  Range: 65 - 99 mg/dL 350 (H) 93  247 (H)  BUN Latest Ref Range: 8 - 27 mg/dL 15 21 23   Creatinine Latest Ref Range: 0.76 - 1.27 mg/dL 1.88 (H) 1.84 (H) 1.85 (H)  Calcium Latest Ref Range: 8.6 - 10.2 mg/dL 8.4 (L) 8.6 8.8  Anion gap Latest Ref Range: 5 - 15  9    BUN/Creatinine Ratio Latest Ref Range: 10 - 24   11 12   Phosphorus Latest Ref Range: 2.5 - 4.6 mg/dL 3.6    Magnesium Latest Ref Range: 1.7 - 2.4 mg/dL 1.9    Alkaline Phosphatase Latest Ref Range: 39 - 117 IU/L 197 (H) 101   Albumin Latest Ref Range: 3.7 - 4.7 g/dL 2.5 (L) 3.7   Albumin/Globulin Ratio Latest Ref Range: 1.2 - 2.2   1.2   AST Latest Ref Range: 0 - 40 IU/L 65 (H) 25   ALT Latest Ref Range: 0 - 44 IU/L 65 (H) 22   Total Protein Latest Ref Range: 6.0 - 8.5 g/dL 7.5 6.8   Total Bilirubin Latest Ref Range: 0.0 - 1.2 mg/dL 1.0 0.6   GFR, Est Non African American Latest Ref Range: >59 mL/min/1.73 35 (L) 36 (L) 36 (L)  GFR, Est African American Latest Ref Range: >59 mL/min/1.73 41 (L) 42 (L) 41 (L)    Review of Systems  Constitution: Negative for decreased appetite, malaise/fatigue, weight gain and weight loss.  HENT: Negative for congestion.   Eyes: Negative for visual disturbance.  Cardiovascular: Positive for leg swelling. Negative for chest pain, dyspnea on exertion, palpitations and syncope.  Respiratory: Negative for shortness of breath.   Endocrine: Negative for cold intolerance.  Hematologic/Lymphatic: Does not bruise/bleed easily.  Skin: Negative for itching and rash.  Musculoskeletal: Negative for myalgias.  Gastrointestinal: Negative for abdominal pain, nausea and vomiting.  Genitourinary: Negative for dysuria.  Neurological: Negative for dizziness and weakness.  Psychiatric/Behavioral: The patient is not nervous/anxious.   All other systems reviewed and are negative.       *** There were no vitals filed for this visit.  Objective:    Physical Exam  Constitutional: He is oriented to person, place,  and time. He appears well-developed and well-nourished. No distress.  Pulmonary/Chest: Effort normal.  Musculoskeletal:        General: Edema (1-2+, mostly in upper legs. He wears compression stockings) present.  Neurological: He is alert and oriented to person, place, and time.  Psychiatric: He has a normal mood and affect.  Nursing note and vitals reviewed.         Assessment & Recommendations:   73 year old African-American male with coronary artery disease status post complex LAD PCI 03/2017, HFpEF, pulmonary hypertension, type II diabetes mellitus, hypertension, CKD III. Recent COVID negative, flu negative, multifocal pneumonia, sepsis, respiratory failure 07/2018, now recovering.  Leg edema/HFpEF: Resolved. Take Torsemide 20 mg in am, and only as needed in pm.   CAD: Stable. No current angina. Troponin elevation on hospital admission in 07/2018 likely due to supply demand mismatch in the setting of septic shock. Continue aspirin, metoprolol succinate. Increase lipitor to 40 mg daily. Check lipid panel and CMP in 01/2019.   Pulmonary hypertension: WHO Grp II, possibly out of proportion. Continue managmenet with diuretics at this time.  Hypertension: Continue current therapy.   Moderate carotid stenoses: Asymptomatic. Continue risk factor modification. Repeat carotid ultrasound in 01/2019.  Anemia: Stable. Likely due to CKD. Continue iron supplementation. Follow p with PCP.  Follow up in 01/2019.  Nigel Mormon, MD [facility] Pager: [phone] Office:  [phone] If no answer Cell [phone]

## 2019-02-06 NOTE — Progress Notes (Signed)
He is seeing you soon

## 2019-02-11 ENCOUNTER — Other Ambulatory Visit: Payer: Self-pay

## 2019-02-11 ENCOUNTER — Encounter: Payer: Self-pay | Admitting: Cardiology

## 2019-02-11 ENCOUNTER — Ambulatory Visit (INDEPENDENT_AMBULATORY_CARE_PROVIDER_SITE_OTHER): Payer: Medicare Other | Admitting: Cardiology

## 2019-02-11 ENCOUNTER — Ambulatory Visit: Payer: Medicare Other | Admitting: Cardiology

## 2019-02-11 VITALS — BP 138/67 | HR 70 | Ht 74.0 in | Wt 213.0 lb

## 2019-02-11 DIAGNOSIS — I10 Essential (primary) hypertension: Secondary | ICD-10-CM

## 2019-02-11 DIAGNOSIS — I2510 Atherosclerotic heart disease of native coronary artery without angina pectoris: Secondary | ICD-10-CM

## 2019-02-11 DIAGNOSIS — N183 Chronic kidney disease, stage 3 unspecified: Secondary | ICD-10-CM

## 2019-02-11 DIAGNOSIS — E782 Mixed hyperlipidemia: Secondary | ICD-10-CM

## 2019-02-11 DIAGNOSIS — I6523 Occlusion and stenosis of bilateral carotid arteries: Secondary | ICD-10-CM | POA: Diagnosis not present

## 2019-02-11 DIAGNOSIS — I5032 Chronic diastolic (congestive) heart failure: Secondary | ICD-10-CM

## 2019-02-11 DIAGNOSIS — I129 Hypertensive chronic kidney disease with stage 1 through stage 4 chronic kidney disease, or unspecified chronic kidney disease: Secondary | ICD-10-CM

## 2019-02-11 MED ORDER — AMLODIPINE BESYLATE 10 MG PO TABS: 10.0000 mg | ORAL_TABLET | Freq: Every day | ORAL | 3 refills | Status: DC

## 2019-02-11 NOTE — Progress Notes (Signed)
Virtual Visit via Video Note   Subjective:   David [name], male    DOB: 01-May-1946, 73 y.o.   MRN: [id number]       Chief complaint:  Leg swelling   HPI  73 year old African-American male with hypertension, type 2 DM, CKD 3, CAD s/p complex LAD PCI 03/2017, mod carotid stenosis, HFpEF, WHO grp II pulmonary hypertension.  Recent carotid duplex showed mod carotid artery stenosis. He is doing very well without any cardiac symptoms. Blood pressure is very well controlled. He has had issues with his diabetic retinopathy for which he seeing a retina specialist.   Past Medical History:  Diagnosis Date  . Arthritis   . CAD in native artery    s/p stent in 11/18  . CHF (congestive heart failure) ([place])    normal echo in 11/18  . CKD (chronic kidney disease)   . DDD (degenerative disc disease), cervical   . Diabetes mellitus with complication (HCC)    Type II  . Diabetic neuropathy ([place])   . Diabetic retinopathy ([place])   . GERD (gastroesophageal reflux disease)   . Glaucoma   . Hypertension      Past Surgical History:  Procedure Laterality Date  . AMPUTATION Right 07/09/2016   Procedure: Right Great Toe Amputation at Metatarsophalangeal Joint;  Surgeon: [name], MD;  Location: [place];  Service: Orthopedics;  Laterality: Right;  . AMPUTATION Right 05/28/2018   Procedure: RIGHT SECOND TOE AMPUTATION;  Surgeon: [name], MD;  Location: [place];  Service: Orthopedics;  Laterality: Right;  . BACK SURGERY     4  . CARDIAC CATHETERIZATION    . CORONARY STENT INTERVENTION N/A 04/06/2017   Procedure: CORONARY STENT INTERVENTION;  Surgeon: Nigel Mormon, MD;  Location: [facility];  Service: Cardiovascular;  Laterality: N/A;  . CORONARY STENT INTERVENTION N/A 04/07/2017   Procedure: CORONARY STENT INTERVENTION;  Surgeon: Nigel Mormon, MD;  Location: [facility];  Service: Cardiovascular;  Laterality: N/A;  . CYST EXCISION     on Back  .  ENDOTRACHEAL INTUBATION EMERGENT  08/07/2018      . JOINT REPLACEMENT    . LAPAROSCOPIC ABDOMINAL EXPLORATION N/A 11/26/2017   Procedure: LAPAROSCOPIC ABDOMINAL EXPLORATION, DRAINAGE OF APPENDICEAL ABCESS. PLACEMENT OF DRAIN;  Surgeon: Alphonsa Overall, MD;  Location: [facility];  Service: General;  Laterality: N/A;  . RIGHT/LEFT HEART CATH AND CORONARY ANGIOGRAPHY N/A 04/06/2017   Procedure: RIGHT/LEFT HEART CATH AND CORONARY ANGIOGRAPHY;  Surgeon: Nigel Mormon, MD;  Location: [facility];  Service: Cardiovascular;  Laterality: N/A;  . TOE SURGERY  05/2018   Left   . TOTAL HIP ARTHROPLASTY     Right      Social History   Socioeconomic History  . Marital status: Married    Spouse name: Not on file  . Number of children: 2  . Years of education: 30  . Highest education level: Master's degree (e.g., MA, MS, MEng, MEd, MSW, MBA)  Occupational History  . Occupation: Retired  Scientific laboratory technician  . Financial resource strain: Not hard at all  . Food insecurity    Worry: Not on file    Inability: Not on file  . Transportation needs    Medical: Not on file    Non-medical: Not on file  Tobacco Use  . Smoking status: Never Smoker  . Smokeless tobacco: Never Used  Substance and Sexual Activity  . Alcohol use: Yes  . Drug use: No  .  Sexual activity: Yes  Lifestyle  . Physical activity    Days per week: 0 days    Minutes per session: 0 min  . Stress: Not at all  Relationships  . Social Herbalist on phone: Twice a week    Gets together: Twice a week    Attends religious service: 1 to 4 times per year    Active member of club or organization: Yes    Attends meetings of clubs or organizations: 1 to 4 times per year    Relationship status: Married  . Intimate partner violence    Fear of current or ex partner: No    Emotionally abused: No    Physically abused: No    Forced sexual activity: No  Other Topics Concern  . Not on file  Social History Narrative  . Not on  file     Family History  Problem Relation Age of Onset  . Diabetes Other   . Hyperlipidemia Other   . Hypertension Other   . Stroke Other   . Alzheimer's disease Other   . Thyroid disease Mother   . Diabetes Mellitus II Father   . Alzheimer's disease Father      Current Outpatient Medications on File Prior to Visit  Medication Sig Dispense Refill  . albuterol (PROVENTIL HFA;VENTOLIN HFA) 108 (90 Base) MCG/ACT inhaler Inhale 2 puffs into the lungs every 6 (six) hours as needed for wheezing or shortness of breath. 1 Inhaler 2  . amLODipine (NORVASC) 5 MG tablet Take 1 tablet (5 mg total) by mouth daily. 90 tablet 1  . aspirin EC 81 MG tablet Take 81 mg by mouth daily.    Marland Kitchen atorvastatin (LIPITOR) 40 MG tablet Take 1 tablet (40 mg total) by mouth daily for 90 doses. 90 tablet 1  . BIDIL 20-37.5 MG tablet TAKE 2 TABLETS BY MOUTH 2 TIMES A DAY. 120 tablet 4  . dicyclomine (BENTYL) 10 MG capsule Take 1 capsule (10 mg total) by mouth 4 (four) times daily -  before meals and at bedtime. (Patient taking differently: Take 10 mg by mouth 3 (three) times daily before meals. ) 10 capsule 0  . dorzolamide-timolol (COSOPT) 22.3-6.8 MG/ML ophthalmic solution Place 1 drop into both eyes 2 (two) times daily.  98  . Ensure Max Protein (ENSURE MAX PROTEIN) LIQD Take 330 mLs (11 oz total) by mouth 2 (two) times daily. 330 mL 0  . ferrous sulfate 325 (65 FE) MG tablet Take 325 mg by mouth daily with breakfast.    . gabapentin (NEURONTIN) 600 MG tablet Take 600 mg by mouth 2 (two) times daily.     Marland Kitchen guaiFENesin (MUCINEX) 600 MG 12 hr tablet Take 1 tablet (600 mg total) by mouth 2 (two) times daily. 10 tablet 0  . HUMALOG KWIKPEN 100 UNIT/ML KiwkPen Inject 4 Units into the skin 3 (three) times daily.  4  . HYDROcodone-acetaminophen (NORCO) 10-325 MG tablet Take 1 tablet by mouth 2 (two) times a day.     . Insulin Detemir (LEVEMIR FLEXTOUCH) 100 UNIT/ML Pen Inject 20 Units into the skin daily. (Patient taking  differently: Inject 30 Units into the skin daily. )    . methocarbamol (ROBAXIN) 750 MG tablet Take 1 tablet (750 mg total) by mouth every 6 (six) hours as needed for muscle spasms. 30 tablet 0  . metoprolol succinate (TOPROL-XL) 100 MG 24 hr tablet TAKE 1 TABLET BY MOUTH EVERY DAY 30 tablet 5  . Multiple Vitamin (  MULTIVITAMIN WITH MINERALS) TABS tablet Take 1 tablet by mouth daily. 30 tablet 0  . mupirocin ointment (BACTROBAN) 2 % Apply 1 application topically daily. 22 g 0  . nitroGLYCERIN (NITROSTAT) 0.4 MG SL tablet Place 1 tablet (0.4 mg total) under the tongue every 5 (five) minutes as needed for chest pain. 30 tablet 3  . saccharomyces boulardii (FLORASTOR) 250 MG capsule Take 1 capsule (250 mg total) by mouth 2 (two) times daily.    Marland Kitchen torsemide (DEMADEX) 20 MG tablet Take 1 tablet (20 mg total) by mouth 2 (two) times daily. 180 tablet 1  . traZODone (DESYREL) 50 MG tablet Take 100 mg by mouth at bedtime.      No current facility-administered medications on file prior to visit.     Cardiovascular studies:  EKG 02/11/2019: Sinus rhythm 66 bpm. First degree A-V block   Carotid artery duplex  02/01/2019: Stenosis in the right internal carotid artery (16-49%). Stenosis in the right external carotid artery (<50%). Stenosis in the left internal carotid artery (50-69%). Stenosis in the left external carotid artery (<50%). Antegrade right vertebral artery flow. Antegrade left vertebral artery flow. Compared to 07/20/2018, left ICA velocity previously suggested >70% stenosis. Follow up in six months is appropriate if clinically indicated.  Hospital Echocardiogram 07/19/2018:  1. The left ventricle has low normal systolic function, with an ejection fraction of 50-55%. There is concentric left ventricular hypertrophy. No evidence of left ventricular regional wall motion abnormalities. Diastolic function not assessed.  2. The right ventricle has normal systolc function. There is no increase in  right ventricular wall thickness.  3. Mild tricuspid regurgitation. This is a limited study and inadequate to assess RV systolic pressure.  4. Unlike previous study in 12/2017, pulmonary hypertension and diastolic dysfunction not adequately evaluated in this limited study. No other significant change noted.  EKG 07/30/2018: Sinus rhythm 70 bpm First degree A-V block  Left atrial enlargement.  Nonspecific T-abnormality.   Carotid artery duplex 07/20/2018 Stenosis in the right internal carotid artery (16-49%). Severe stenosis in the left internal carotid artery (50-69%). The left PSV internal/common carotid artery ratio of 4.0 is consistent with a stenosis of >70%. Stenosis in the left external carotid artery (>50%). Antegrade right vertebral artery flow. Antegrade left vertebral artery flow. Compared to 12/29/2017, left ICA stenosis appears to have slightly progressed. Follow up in six months is appropriate if clinically indicated.  Echocardiogram 12/22/2017: Left ventricle: The cavity size was normal. There was moderate  concentric hypertrophy. Systolic function was normal. The  estimated ejection fraction was in the range of 50% to 55%.  Features are consistent with a pseudonormal left ventricular  filling pattern, with concomitant abnormal relaxation and  increased filling pressure (grade 2 diastolic dysfunction).  Doppler parameters are consistent with both elevated ventricular  end-diastolic filling pressure and elevated left atrial filling  pressure. - Ventricular septum: The contour showed diastolic flattening. - Mitral valve: There was mild regurgitation. - Right ventricle: The cavity size was mildly dilated. - Atrial septum: No defect or patent foramen ovale was identified. - Pulmonary arteries: PA peak pressure: 64 mm Hg (S). - Compared to 03/30/2017, pulmonary hypertensin and RV strain is  new.  Cath 04/06/2017: Successful complex PTCA and overlapping  stents mid LAD Complexity due to severe calcification and severe tortuosity Resolute 3.0 X 18 mm Synergy 3.0 X 16 mm Residual 10% unde expansion of proximal stent  dLAD 2.75 X 26 mm Resolure DES  Recent labs:  Results for Madilyn Fireman, Apostolos E (  MRN [id number]) as of 10/15/2018 13:50  Ref. Range 08/15/2018 03:17 09/10/2018 15:26 10/12/2018 [id number]  BASIC METABOLIC PANEL Unknown   Rpt (A)  COMPREHENSIVE METABOLIC PANEL Unknown Rpt (A) Rpt (A)   Sodium Latest Ref Range: 134 - 144 mmol/L 133 (L) 140 139  Potassium Latest Ref Range: 3.5 - 5.2 mmol/L 4.4 5.2 4.8  Chloride Latest Ref Range: 96 - 106 mmol/L 101 100 102  CO2 Latest Ref Range: 20 - 29 mmol/L 23 24 24   Glucose Latest Ref Range: 65 - 99 mg/dL 350 (H) 93 247 (H)  BUN Latest Ref Range: 8 - 27 mg/dL 15 21 23   Creatinine Latest Ref Range: 0.76 - 1.27 mg/dL 1.88 (H) 1.84 (H) 1.85 (H)  Calcium Latest Ref Range: 8.6 - 10.2 mg/dL 8.4 (L) 8.6 8.8  Anion gap Latest Ref Range: 5 - 15  9    BUN/Creatinine Ratio Latest Ref Range: 10 - 24   11 12   Phosphorus Latest Ref Range: 2.5 - 4.6 mg/dL 3.6    Magnesium Latest Ref Range: 1.7 - 2.4 mg/dL 1.9    Alkaline Phosphatase Latest Ref Range: 39 - 117 IU/L 197 (H) 101   Albumin Latest Ref Range: 3.7 - 4.7 g/dL 2.5 (L) 3.7   Albumin/Globulin Ratio Latest Ref Range: 1.2 - 2.2   1.2   AST Latest Ref Range: 0 - 40 IU/L 65 (H) 25   ALT Latest Ref Range: 0 - 44 IU/L 65 (H) 22   Total Protein Latest Ref Range: 6.0 - 8.5 g/dL 7.5 6.8   Total Bilirubin Latest Ref Range: 0.0 - 1.2 mg/dL 1.0 0.6   GFR, Est Non African American Latest Ref Range: >59 mL/min/1.73 35 (L) 36 (L) 36 (L)  GFR, Est African American Latest Ref Range: >59 mL/min/1.73 41 (L) 42 (L) 41 (L)    Review of Systems  Constitution: Negative for decreased appetite, malaise/fatigue, weight gain and weight loss.  HENT: Negative for congestion.   Eyes: Negative for visual disturbance.  Cardiovascular: Positive for leg swelling. Negative for chest  pain, dyspnea on exertion, palpitations and syncope.  Respiratory: Negative for shortness of breath.   Endocrine: Negative for cold intolerance.  Hematologic/Lymphatic: Does not bruise/bleed easily.  Skin: Negative for itching and rash.  Musculoskeletal: Negative for myalgias.  Gastrointestinal: Negative for abdominal pain, nausea and vomiting.  Genitourinary: Negative for dysuria.  Neurological: Negative for dizziness and weakness.  Psychiatric/Behavioral: The patient is not nervous/anxious.   All other systems reviewed and are negative.        Vitals:   02/11/19 1258  BP: 138/67  Pulse: 70  SpO2: 96%     Objective:    Physical Exam  Constitutional: He is oriented to person, place, and time. He appears well-developed and well-nourished. No distress.  HENT:  Head: Normocephalic and atraumatic.  Eyes: Pupils are equal, round, and reactive to light. Conjunctivae are normal.  Neck: No JVD present.  Cardiovascular: Normal rate, regular rhythm and intact distal pulses.  Pulses:      Carotid pulses are on the left side with bruit. Pulmonary/Chest: Effort normal and breath sounds normal. He has no wheezes. He has no rales.  Abdominal: Soft. Bowel sounds are normal. There is no rebound.  Musculoskeletal:        General: No edema.  Lymphadenopathy:    He has no cervical adenopathy.  Neurological: He is alert and oriented to person, place, and time. No cranial nerve deficit.  Skin: Skin is warm and dry.  Psychiatric:  He has a normal mood and affect.  Nursing note and vitals reviewed.         Assessment & Recommendations:   73 year old African-American male with hypertension, type 2 DM, CKD 3, CAD s/p complex LAD PCI 03/2017, mod carotid stenosis, HFpEF, WHO grp II pulmonary hypertension.  Leg edema/HFpEF: Resolved. Take Torsemide 20 mg in am, and only as needed in pm.   CAD: Stable. No current angina. Troponin elevation on hospital admission in 07/2018 likely due to  supply demand mismatch in the setting of septic shock. Continue aspirin, metoprolol succinate. Increase lipitor to 40 mg daily. Check lipid panel and CMP In march 2020.  Pulmonary hypertension: WHO Grp II, possibly out of proportion. Continue managmenet with diuretics at this time.  Hypertension: Continue current therapy.   Moderate carotid stenoses: Asymptomatic. Continue risk factor modification. Repeat carotid ultrasound in 07/2019.  Anemia: Stable. Likely due to CKD. Continue iron supplementation. Follow p with PCP.  Follow up in 01/2019.  Nigel Mormon, MD [facility]. [place] Pager: [phone] Office: [phone] If no answer Cell [phone]

## 2019-02-12 ENCOUNTER — Encounter: Payer: Self-pay | Admitting: Cardiology

## 2019-02-15 ENCOUNTER — Ambulatory Visit: Payer: Medicare Other | Admitting: Family

## 2019-02-22 ENCOUNTER — Other Ambulatory Visit: Payer: Self-pay

## 2019-02-22 ENCOUNTER — Encounter: Payer: Self-pay | Admitting: Family

## 2019-02-22 ENCOUNTER — Ambulatory Visit (INDEPENDENT_AMBULATORY_CARE_PROVIDER_SITE_OTHER): Payer: Medicare Other | Admitting: Family

## 2019-02-22 VITALS — Ht 74.0 in | Wt 213.0 lb

## 2019-02-22 DIAGNOSIS — R600 Localized edema: Secondary | ICD-10-CM

## 2019-02-22 DIAGNOSIS — M6701 Short Achilles tendon (acquired), right ankle: Secondary | ICD-10-CM

## 2019-02-22 DIAGNOSIS — L97511 Non-pressure chronic ulcer of other part of right foot limited to breakdown of skin: Secondary | ICD-10-CM

## 2019-02-22 DIAGNOSIS — E1142 Type 2 diabetes mellitus with diabetic polyneuropathy: Secondary | ICD-10-CM | POA: Diagnosis not present

## 2019-02-22 DIAGNOSIS — B351 Tinea unguium: Secondary | ICD-10-CM | POA: Diagnosis not present

## 2019-02-22 NOTE — Progress Notes (Signed)
Office Visit Note   Patient: David [name]           Date of Birth: 11-27-1945           MRN: [id number] Visit Date: 02/22/2019              Requested by: [name], MD [address] [place],  [place] [id number] PCP: [name], MD  Chief Complaint  Patient presents with  . Right Foot - Pain, Follow-up      HPI: The patient is a 73 year old gentleman seen today in follow-up, concerned for worsening of right foot ulcer.  He has been having increasing pain to the wound beneath his first metatarsal head on the right with weightbearing.  Has not had any change in size of his ulcer he is just concerned for increased pain.  No fever no chills  Has been wearing Vive compression hose with direct skin contact, bilaterally. In regular shoewear.  Assessment & Plan: Visit Diagnoses:  1. Acquired contracture of Achilles tendon, right   2. Right foot ulcer, limited to breakdown of skin ([place])   3. Onychomycosis   4. Diabetic polyneuropathy associated with type 2 diabetes mellitus ([place])   5. Bilateral leg edema     Plan: He will continue with his compression garments daily.  Mupirocin dressings to right foot ulcer. Encouraged use of custom orthotics and pressure relieving padding. Minimize weight bearing and pressure to wound area. He will follow-up in 4 weeks or sooner should he have difficulty in the interim.  Follow-Up Instructions: Return in about 4 weeks (around 03/22/2019).   Ortho Exam  Patient is alert, oriented, no adenopathy, well-dressed, normal affect, normal respiratory effort.  On examination of the right lower extremity, has ulceration beneath the first metatarsal head today this was debrided with a 10 blade knife back to viable tissue.  Surrounding callus and nonviable tissue to ulcer, was debrided with a 10 blade. Underlying ulcer is 1 cm in diameter and 5 mm deep there is no surrounding erythema no drainage no signs of infection.  Slight surrounding  maceration.  He has palpable pedal pulses.  The fifth toenail on the right is avulsed to the trim the toenails of the right foot as patient is unable to the treatment nails.  Imaging: No results found.   Labs: Lab Results  Component Value Date   HGBA1C 7.1 (H) 05/28/2018   HGBA1C 6.3 (H) 12/19/2017   HGBA1C 7.5 (H) 11/24/2017   ESRSEDRATE 73 (H) 02/07/2015   ESRSEDRATE 22 (H) 09/15/2013   CRP 8.2 (H) 02/07/2015   LABURIC 4.4 08/12/2018   REPTSTATUS 08/16/2018 FINAL 08/15/2018   GRAMSTAIN  08/07/2018    MODERATE WBC PRESENT, PREDOMINANTLY PMN NO ORGANISMS SEEN    CULT MULTIPLE SPECIES PRESENT, SUGGEST RECOLLECTION (A) 08/15/2018     Lab Results  Component Value Date   ALBUMIN 3.7 09/10/2018   ALBUMIN 2.5 (L) 08/15/2018   ALBUMIN 2.4 (L) 08/14/2018   LABURIC 4.4 08/12/2018    Body mass index is 27.35 kg/m.  Orders:  No orders of the defined types were placed in this encounter.  No orders of the defined types were placed in this encounter.    Procedures: No procedures performed  Clinical Data: No additional findings.  ROS:  All other systems negative, except as noted in the HPI. Review of Systems  Constitutional: Negative for chills and fever.  Cardiovascular: Negative for leg swelling.  Skin: Positive for wound. Negative for color change.  Objective: Vital Signs: Ht 6\' 2"  (1.88 m)   Wt 213 lb (96.6 kg)   BMI 27.35 kg/m   Specialty Comments:  No specialty comments available.  PMFS History: Patient Active Problem List   Diagnosis Date Noted  . Mixed hyperlipidemia 10/15/2018  . Bilateral leg edema 09/17/2018  . CKD (chronic kidney disease) stage 3, GFR 30-59 ml/min 09/10/2018  . Anemia 09/10/2018  . Pain and swelling of wrist, right 08/14/2018  . Bilateral carotid artery stenosis 07/30/2018  . Coronary artery disease involving native coronary artery of native heart without angina pectoris 07/30/2018  . Snoring 02/11/2018  . Chronic heart  failure with preserved ejection fraction (HFpEF) ([place]) 12/22/2017  . At risk for adverse drug reaction 12/15/2017  . Chronic pain syndrome 12/15/2017  . Status post coronary artery stent placement   . Multifocal pneumonia 03/30/2017  . Insulin-requiring or dependent type II diabetes mellitus ([place]) 03/29/2017  . Acute on chronic respiratory failure with hypoxia ([place])   . Pulmonary congestion   . Acquired contracture of Achilles tendon, right 08/19/2016  . Amputated great toe, right ([place]) 07/18/2016  . Onychomycosis 05/29/2016  . Diabetic polyneuropathy associated with type 2 diabetes mellitus ([place]) 04/09/2016  . Right foot ulcer, limited to breakdown of skin ([place]) 04/09/2016  . Unspecified hereditary and idiopathic peripheral neuropathy 09/15/2013  . Malaise 08/14/2013  . Essential hypertension 08/14/2013  . Chronic back pain 08/14/2013  . Glaucoma 08/14/2013  . Headache 08/14/2013   Past Medical History:  Diagnosis Date  . Arthritis   . CAD in native artery    s/p stent in 11/18  . CHF (congestive heart failure) ([place])    normal echo in 11/18  . CKD (chronic kidney disease)   . DDD (degenerative disc disease), cervical   . Diabetes mellitus with complication (HCC)    Type II  . Diabetic neuropathy ([place])   . Diabetic retinopathy ([place])   . GERD (gastroesophageal reflux disease)   . Glaucoma   . Hypertension     Family History  Problem Relation Age of Onset  . Diabetes Other   . Hyperlipidemia Other   . Hypertension Other   . Stroke Other   . Alzheimer's disease Other   . Thyroid disease Mother   . Diabetes Mellitus II Father   . Alzheimer's disease Father     Past Surgical History:  Procedure Laterality Date  . AMPUTATION Right 07/09/2016   Procedure: Right Great Toe Amputation at Metatarsophalangeal Joint;  Surgeon: [name], MD;  Location: [place];  Service: Orthopedics;  Laterality: Right;  . AMPUTATION Right 05/28/2018   Procedure: RIGHT SECOND TOE AMPUTATION;   Surgeon: [name], MD;  Location: [place];  Service: Orthopedics;  Laterality: Right;  . BACK SURGERY     4  . CARDIAC CATHETERIZATION    . CORONARY STENT INTERVENTION N/A 04/06/2017   Procedure: CORONARY STENT INTERVENTION;  Surgeon: Nigel Mormon, MD;  Location: [facility];  Service: Cardiovascular;  Laterality: N/A;  . CORONARY STENT INTERVENTION N/A 04/07/2017   Procedure: CORONARY STENT INTERVENTION;  Surgeon: Nigel Mormon, MD;  Location: [facility];  Service: Cardiovascular;  Laterality: N/A;  . CYST EXCISION     on Back  . ENDOTRACHEAL INTUBATION EMERGENT  08/07/2018      . JOINT REPLACEMENT    . LAPAROSCOPIC ABDOMINAL EXPLORATION N/A 11/26/2017   Procedure: LAPAROSCOPIC ABDOMINAL EXPLORATION, DRAINAGE OF APPENDICEAL ABCESS. PLACEMENT OF DRAIN;  Surgeon: Alphonsa Overall, MD;  Location:  [facility];  Service: General;  Laterality: N/A;  . RIGHT/LEFT HEART CATH AND CORONARY ANGIOGRAPHY N/A 04/06/2017   Procedure: RIGHT/LEFT HEART CATH AND CORONARY ANGIOGRAPHY;  Surgeon: Nigel Mormon, MD;  Location: [facility];  Service: Cardiovascular;  Laterality: N/A;  . TOE SURGERY  05/2018   Left   . TOTAL HIP ARTHROPLASTY     Right    Social History   Occupational History  . Occupation: Retired  Tobacco Use  . Smoking status: Never Smoker  . Smokeless tobacco: Never Used  Substance and Sexual Activity  . Alcohol use: Yes  . Drug use: No  . Sexual activity: Yes

## 2019-03-23 ENCOUNTER — Telehealth: Payer: Self-pay

## 2019-03-23 NOTE — Telephone Encounter (Signed)
Pt wife called in regard to pt Amlodipine. Wife want to know if he was taking the 5 or the 10. Inform her pt is taking 5mg .

## 2019-03-29 ENCOUNTER — Encounter: Payer: Self-pay | Admitting: Family

## 2019-03-29 ENCOUNTER — Ambulatory Visit: Payer: Medicare Other | Admitting: Family

## 2019-03-29 ENCOUNTER — Other Ambulatory Visit: Payer: Self-pay

## 2019-03-29 VITALS — Ht 74.0 in | Wt 213.0 lb

## 2019-03-29 DIAGNOSIS — L97511 Non-pressure chronic ulcer of other part of right foot limited to breakdown of skin: Secondary | ICD-10-CM

## 2019-03-29 NOTE — Progress Notes (Signed)
Office Visit Note   Patient: David [name]           Date of Birth: 1946-03-12           MRN: [id number] Visit Date: 03/29/2019              Requested by: [name], MD [address] [place],  [place] [address] PCP: [name], MD  Chief Complaint  Patient presents with  . Right Foot - Follow-up      HPI: Patient is s/p great toe and 2nd toe amputation right foot. Comes in today for periodic trimming of ulcer on the plantar surface of his forefoot. No new complaints. Wearing compression socks  Assessment & Plan: Visit Diagnoses: R plantar forefoot ulcer s/p 1st ray amputation  Plan: Continue to use offloading pad, achilles stretching follow up 1 month  Follow-Up Instructions: 1 month  Ortho Exam  Patient is alert, oriented, no adenopathy, well-dressed, normal affect, normal respiratory effort. Swelling controlled. Foot warm. Palpable DP pulse. Healed amputation stump. Ulcer with surrounding macerated skin. No odor or drainage. 5 cm in diameter.  Imaging: No results found. No images are attached to the encounter.  Labs: Lab Results  Component Value Date   HGBA1C 7.1 (H) 05/28/2018   HGBA1C 6.3 (H) 12/19/2017   HGBA1C 7.5 (H) 11/24/2017   ESRSEDRATE 73 (H) 02/07/2015   ESRSEDRATE 22 (H) 09/15/2013   CRP 8.2 (H) 02/07/2015   LABURIC 4.4 08/12/2018   REPTSTATUS 08/16/2018 FINAL 08/15/2018   GRAMSTAIN  08/07/2018    MODERATE WBC PRESENT, PREDOMINANTLY PMN NO ORGANISMS SEEN    CULT MULTIPLE SPECIES PRESENT, SUGGEST RECOLLECTION (A) 08/15/2018     Lab Results  Component Value Date   ALBUMIN 3.7 09/10/2018   ALBUMIN 2.5 (L) 08/15/2018   ALBUMIN 2.4 (L) 08/14/2018   LABURIC 4.4 08/12/2018    Lab Results  Component Value Date   MG 1.9 08/15/2018   MG 1.8 08/14/2018   MG 1.7 08/11/2018   No results found for: VD25OH  No results found for: PREALBUMIN CBC EXTENDED Latest Ref Rng & Units 09/10/2018 08/15/2018 08/14/2018  WBC 3.4 -  10.8 x10E3/uL 6.3 19.5(H) 17.0(H)  RBC 4.14 - 5.80 x10E6/uL 2.18(LL) 2.35(L) 2.30(L)  HGB 13.0 - 17.7 g/dL 8.0(L) 7.8(L) 7.6(L)  HCT 37.5 - 51.0 % 23.6(L) 25.2(L) 24.5(L)  PLT 150 - 450 x10E3/uL 206 315 304  NEUTROABS 1.7 - 7.7 K/uL - 18.0(H) 12.9(H)  LYMPHSABS 0.7 - 4.0 K/uL - 0.9 2.3     Body mass index is 27.35 kg/m.  Orders:  No orders of the defined types were placed in this encounter.  No orders of the defined types were placed in this encounter.    Procedures: After obtaining verbal consent ulcer was trimmed to bleeding surfaces. Bandage applied Clinical Data: No additional findings.  ROS: All other systems negative, except as noted in the HPI. Review of Systems  Objective: Vital Signs: Ht 6\' 2"  (1.88 m)   Wt 213 lb (96.6 kg)   BMI 27.35 kg/m   Specialty Comments:  No specialty comments available.  PMFS History: Patient Active Problem List   Diagnosis Date Noted  . Mixed hyperlipidemia 10/15/2018  . Bilateral leg edema 09/17/2018  . CKD (chronic kidney disease) stage 3, GFR 30-59 ml/min 09/10/2018  . Anemia 09/10/2018  . Pain and swelling of wrist, right 08/14/2018  . Bilateral carotid artery stenosis 07/30/2018  . Coronary artery disease involving native coronary artery of native heart without  angina pectoris 07/30/2018  . Snoring 02/11/2018  . Chronic heart failure with preserved ejection fraction (HFpEF) ([place]) 12/22/2017  . At risk for adverse drug reaction 12/15/2017  . Chronic pain syndrome 12/15/2017  . Status post coronary artery stent placement   . Multifocal pneumonia 03/30/2017  . Insulin-requiring or dependent type II diabetes mellitus ([place]) 03/29/2017  . Acute on chronic respiratory failure with hypoxia ([place])   . Pulmonary congestion   . Acquired contracture of Achilles tendon, right 08/19/2016  . Amputated great toe, right ([name]) 07/18/2016  . Onychomycosis 05/29/2016  . Diabetic polyneuropathy associated with type 2 diabetes mellitus  ([place]) 04/09/2016  . Right foot ulcer, limited to breakdown of skin ([place]) 04/09/2016  . Unspecified hereditary and idiopathic peripheral neuropathy 09/15/2013  . Malaise 08/14/2013  . Essential hypertension 08/14/2013  . Chronic back pain 08/14/2013  . Glaucoma 08/14/2013  . Headache 08/14/2013   Past Medical History:  Diagnosis Date  . Arthritis   . CAD in native artery    s/p stent in 11/18  . CHF (congestive heart failure) ([place])    normal echo in 11/18  . CKD (chronic kidney disease)   . DDD (degenerative disc disease), cervical   . Diabetes mellitus with complication (HCC)    Type II  . Diabetic neuropathy ([place])   . Diabetic retinopathy ([place])   . GERD (gastroesophageal reflux disease)   . Glaucoma   . Hypertension     Family History  Problem Relation Age of Onset  . Diabetes Other   . Hyperlipidemia Other   . Hypertension Other   . Stroke Other   . Alzheimer's disease Other   . Thyroid disease Mother   . Diabetes Mellitus II Father   . Alzheimer's disease Father     Past Surgical History:  Procedure Laterality Date  . AMPUTATION Right 07/09/2016   Procedure: Right Great Toe Amputation at Metatarsophalangeal Joint;  Surgeon: [name], MD;  Location: [facility];  Service: Orthopedics;  Laterality: Right;  . AMPUTATION Right 05/28/2018   Procedure: RIGHT SECOND TOE AMPUTATION;  Surgeon: [name], MD;  Location: [facility];  Service: Orthopedics;  Laterality: Right;  . BACK SURGERY     4  . CARDIAC CATHETERIZATION    . CORONARY STENT INTERVENTION N/A 04/06/2017   Procedure: CORONARY STENT INTERVENTION;  Surgeon: Nigel Mormon, MD;  Location: [facility];  Service: Cardiovascular;  Laterality: N/A;  . CORONARY STENT INTERVENTION N/A 04/07/2017   Procedure: CORONARY STENT INTERVENTION;  Surgeon: Nigel Mormon, MD;  Location: [facility];  Service: Cardiovascular;  Laterality: N/A;  . CYST EXCISION     on Back  . ENDOTRACHEAL INTUBATION  EMERGENT  08/07/2018      . JOINT REPLACEMENT    . LAPAROSCOPIC ABDOMINAL EXPLORATION N/A 11/26/2017   Procedure: LAPAROSCOPIC ABDOMINAL EXPLORATION, DRAINAGE OF APPENDICEAL ABCESS. PLACEMENT OF DRAIN;  Surgeon: Alphonsa Overall, MD;  Location: [facility];  Service: General;  Laterality: N/A;  . RIGHT/LEFT HEART CATH AND CORONARY ANGIOGRAPHY N/A 04/06/2017   Procedure: RIGHT/LEFT HEART CATH AND CORONARY ANGIOGRAPHY;  Surgeon: Nigel Mormon, MD;  Location: [facility];  Service: Cardiovascular;  Laterality: N/A;  . TOE SURGERY  05/2018   Left   . TOTAL HIP ARTHROPLASTY     Right    Social History   Occupational History  . Occupation: Retired  Tobacco Use  . Smoking status: Never Smoker  . Smokeless tobacco: Never Used  Substance and Sexual  Activity  . Alcohol use: Yes  . Drug use: No  . Sexual activity: Yes

## 2019-04-04 ENCOUNTER — Other Ambulatory Visit: Payer: Self-pay | Admitting: Cardiology

## 2019-04-26 ENCOUNTER — Ambulatory Visit: Payer: Medicare Other | Admitting: Family

## 2019-05-05 ENCOUNTER — Other Ambulatory Visit: Payer: Self-pay | Admitting: Cardiology

## 2019-06-03 ENCOUNTER — Other Ambulatory Visit: Payer: Self-pay

## 2019-06-03 ENCOUNTER — Other Ambulatory Visit: Payer: Self-pay | Admitting: Cardiology

## 2019-06-03 DIAGNOSIS — I5030 Unspecified diastolic (congestive) heart failure: Secondary | ICD-10-CM

## 2019-06-03 MED ORDER — METOPROLOL SUCCINATE ER 100 MG PO TB24: 100.0000 mg | ORAL_TABLET | Freq: Every day | ORAL | 5 refills | Status: DC

## 2019-06-14 ENCOUNTER — Other Ambulatory Visit: Payer: Self-pay

## 2019-06-14 ENCOUNTER — Ambulatory Visit: Payer: Medicare Other | Admitting: Family

## 2019-06-14 ENCOUNTER — Encounter: Payer: Self-pay | Admitting: Family

## 2019-06-14 ENCOUNTER — Ambulatory Visit: Payer: Self-pay

## 2019-06-14 VITALS — Ht 74.0 in | Wt 213.0 lb

## 2019-06-14 DIAGNOSIS — L97511 Non-pressure chronic ulcer of other part of right foot limited to breakdown of skin: Secondary | ICD-10-CM | POA: Diagnosis not present

## 2019-06-14 NOTE — Progress Notes (Signed)
Office Visit Note   Patient: David [name]           Date of Birth: June 28, 1945           MRN: [id number] Visit Date: 06/14/2019              Requested by: [name], MD [address] [place],  [place] [address] PCP: [name], MD  Chief Complaint  Patient presents with  . Right Foot - Pain      HPI: Patient presents in follow-up for his right plantar first metatarsal ulcer.  He has been getting it periodically debrided.  He states over the course the last 2 days he has had some increasing pain and swelling  Assessment & Plan: Visit Diagnoses:  1. Right foot ulcer, limited to breakdown of skin ([place])     Plan: We have made a doughnut for his shoe for pressure relief.  He will follow-up in 4 weeks.  Follow-Up Instructions: No follow-ups on file.   Ortho Exam  Patient is alert, oriented, no adenopathy, well-dressed, normal affect, normal respiratory effort.  Focused examination of his foot does not demonstrate any fluctuance or purulent drainage.  He has callus that does not probe all the way to bone.  After verbal consent this was debrided to healthy bleeding tissue a Band-Aid was applied after hemostasis was achieved  Imaging: No results found. No images are attached to the encounter.  Labs: Lab Results  Component Value Date   HGBA1C 7.1 (H) 05/28/2018   HGBA1C 6.3 (H) 12/19/2017   HGBA1C 7.5 (H) 11/24/2017   ESRSEDRATE 73 (H) 02/07/2015   ESRSEDRATE 22 (H) 09/15/2013   CRP 8.2 (H) 02/07/2015   LABURIC 4.4 08/12/2018   REPTSTATUS 08/16/2018 FINAL 08/15/2018   GRAMSTAIN  08/07/2018    MODERATE WBC PRESENT, PREDOMINANTLY PMN NO ORGANISMS SEEN    CULT MULTIPLE SPECIES PRESENT, SUGGEST RECOLLECTION (A) 08/15/2018     Lab Results  Component Value Date   ALBUMIN 3.7 09/10/2018   ALBUMIN 2.5 (L) 08/15/2018   ALBUMIN 2.4 (L) 08/14/2018   LABURIC 4.4 08/12/2018    Lab Results  Component Value Date   MG 1.9 08/15/2018   MG 1.8  08/14/2018   MG 1.7 08/11/2018   No results found for: VD25OH  No results found for: PREALBUMIN CBC EXTENDED Latest Ref Rng & Units 09/10/2018 08/15/2018 08/14/2018  WBC 3.4 - 10.8 x10E3/uL 6.3 19.5(H) 17.0(H)  RBC 4.14 - 5.80 x10E6/uL 2.18(LL) 2.35(L) 2.30(L)  HGB 13.0 - 17.7 g/dL 8.0(L) 7.8(L) 7.6(L)  HCT 37.5 - 51.0 % 23.6(L) 25.2(L) 24.5(L)  PLT 150 - 450 x10E3/uL 206 315 304  NEUTROABS 1.7 - 7.7 K/uL - 18.0(H) 12.9(H)  LYMPHSABS 0.7 - 4.0 K/uL - 0.9 2.3     Body mass index is 27.35 kg/m.  Orders:  Orders Placed This Encounter  Procedures  . XR Foot 2 Views Right   No orders of the defined types were placed in this encounter.    Procedures: No procedures performed  Clinical Data: No additional findings.  ROS:  All other systems negative, except as noted in the HPI. Review of Systems  Objective: Vital Signs: Ht 6\' 2"  (1.88 m)   Wt 213 lb (96.6 kg)   BMI 27.35 kg/m   Specialty Comments:  No specialty comments available.  PMFS History: Patient Active Problem List   Diagnosis Date Noted  . Mixed hyperlipidemia 10/15/2018  . Bilateral leg edema 09/17/2018  . CKD (chronic kidney  disease) stage 3, GFR 30-59 ml/min 09/10/2018  . Anemia 09/10/2018  . Pain and swelling of wrist, right 08/14/2018  . Bilateral carotid artery stenosis 07/30/2018  . Coronary artery disease involving native coronary artery of native heart without angina pectoris 07/30/2018  . Snoring 02/11/2018  . Chronic heart failure with preserved ejection fraction (HFpEF) ([place]) 12/22/2017  . At risk for adverse drug reaction 12/15/2017  . Chronic pain syndrome 12/15/2017  . Status post coronary artery stent placement   . Multifocal pneumonia 03/30/2017  . Insulin-requiring or dependent type II diabetes mellitus ([place]) 03/29/2017  . Acute on chronic respiratory failure with hypoxia ([place])   . Pulmonary congestion   . Acquired contracture of Achilles tendon, right 08/19/2016  . Amputated great  toe, right ([place]) 07/18/2016  . Onychomycosis 05/29/2016  . Diabetic polyneuropathy associated with type 2 diabetes mellitus ([place]) 04/09/2016  . Right foot ulcer, limited to breakdown of skin ([place]) 04/09/2016  . Unspecified hereditary and idiopathic peripheral neuropathy 09/15/2013  . Malaise 08/14/2013  . Essential hypertension 08/14/2013  . Chronic back pain 08/14/2013  . Glaucoma 08/14/2013  . Headache 08/14/2013   Past Medical History:  Diagnosis Date  . Arthritis   . CAD in native artery    s/p stent in 11/18  . CHF (congestive heart failure) ([place])    normal echo in 11/18  . CKD (chronic kidney disease)   . DDD (degenerative disc disease), cervical   . Diabetes mellitus with complication (HCC)    Type II  . Diabetic neuropathy ([place])   . Diabetic retinopathy ([place])   . GERD (gastroesophageal reflux disease)   . Glaucoma   . Hypertension     Family History  Problem Relation Age of Onset  . Diabetes Other   . Hyperlipidemia Other   . Hypertension Other   . Stroke Other   . Alzheimer's disease Other   . Thyroid disease Mother   . Diabetes Mellitus II Father   . Alzheimer's disease Father     Past Surgical History:  Procedure Laterality Date  . AMPUTATION Right 07/09/2016   Procedure: Right Great Toe Amputation at Metatarsophalangeal Joint;  Surgeon: [name], MD;  Location: [facility];  Service: Orthopedics;  Laterality: Right;  . AMPUTATION Right 05/28/2018   Procedure: RIGHT SECOND TOE AMPUTATION;  Surgeon: [name], MD;  Location: [facility];  Service: Orthopedics;  Laterality: Right;  . BACK SURGERY     4  . CARDIAC CATHETERIZATION    . CORONARY STENT INTERVENTION N/A 04/06/2017   Procedure: CORONARY STENT INTERVENTION;  Surgeon: Nigel Mormon, MD;  Location: [facility];  Service: Cardiovascular;  Laterality: N/A;  . CORONARY STENT INTERVENTION N/A 04/07/2017   Procedure: CORONARY STENT INTERVENTION;  Surgeon: Nigel Mormon, MD;  Location: [facility];  Service: Cardiovascular;  Laterality: N/A;  . CYST EXCISION     on Back  . ENDOTRACHEAL INTUBATION EMERGENT  08/07/2018      . JOINT REPLACEMENT    . LAPAROSCOPIC ABDOMINAL EXPLORATION N/A 11/26/2017   Procedure: LAPAROSCOPIC ABDOMINAL EXPLORATION, DRAINAGE OF APPENDICEAL ABCESS. PLACEMENT OF DRAIN;  Surgeon: Alphonsa Overall, MD;  Location: [facility];  Service: General;  Laterality: N/A;  . RIGHT/LEFT HEART CATH AND CORONARY ANGIOGRAPHY N/A 04/06/2017   Procedure: RIGHT/LEFT HEART CATH AND CORONARY ANGIOGRAPHY;  Surgeon: Nigel Mormon, MD;  Location: [facility];  Service: Cardiovascular;  Laterality: N/A;  . TOE SURGERY  05/2018   Left   .  TOTAL HIP ARTHROPLASTY     Right    Social History   Occupational History  . Occupation: Retired  Tobacco Use  . Smoking status: Never Smoker  . Smokeless tobacco: Never Used  Substance and Sexual Activity  . Alcohol use: Yes  . Drug use: No  . Sexual activity: Yes

## 2019-06-28 ENCOUNTER — Other Ambulatory Visit: Payer: Self-pay | Admitting: Cardiology

## 2019-06-28 DIAGNOSIS — I5032 Chronic diastolic (congestive) heart failure: Secondary | ICD-10-CM

## 2019-06-29 NOTE — Telephone Encounter (Signed)
refill 

## 2019-07-12 ENCOUNTER — Ambulatory Visit: Payer: Medicare Other | Admitting: Family

## 2019-07-12 IMAGING — DX PORTABLE CHEST - 1 VIEW
1 series · 1 of 1 positions shown · non-contrast
Comparison: 08/09/2018

CLINICAL DATA: Hypoxia

EXAM:
PORTABLE CHEST 1 VIEW

[chest ap]
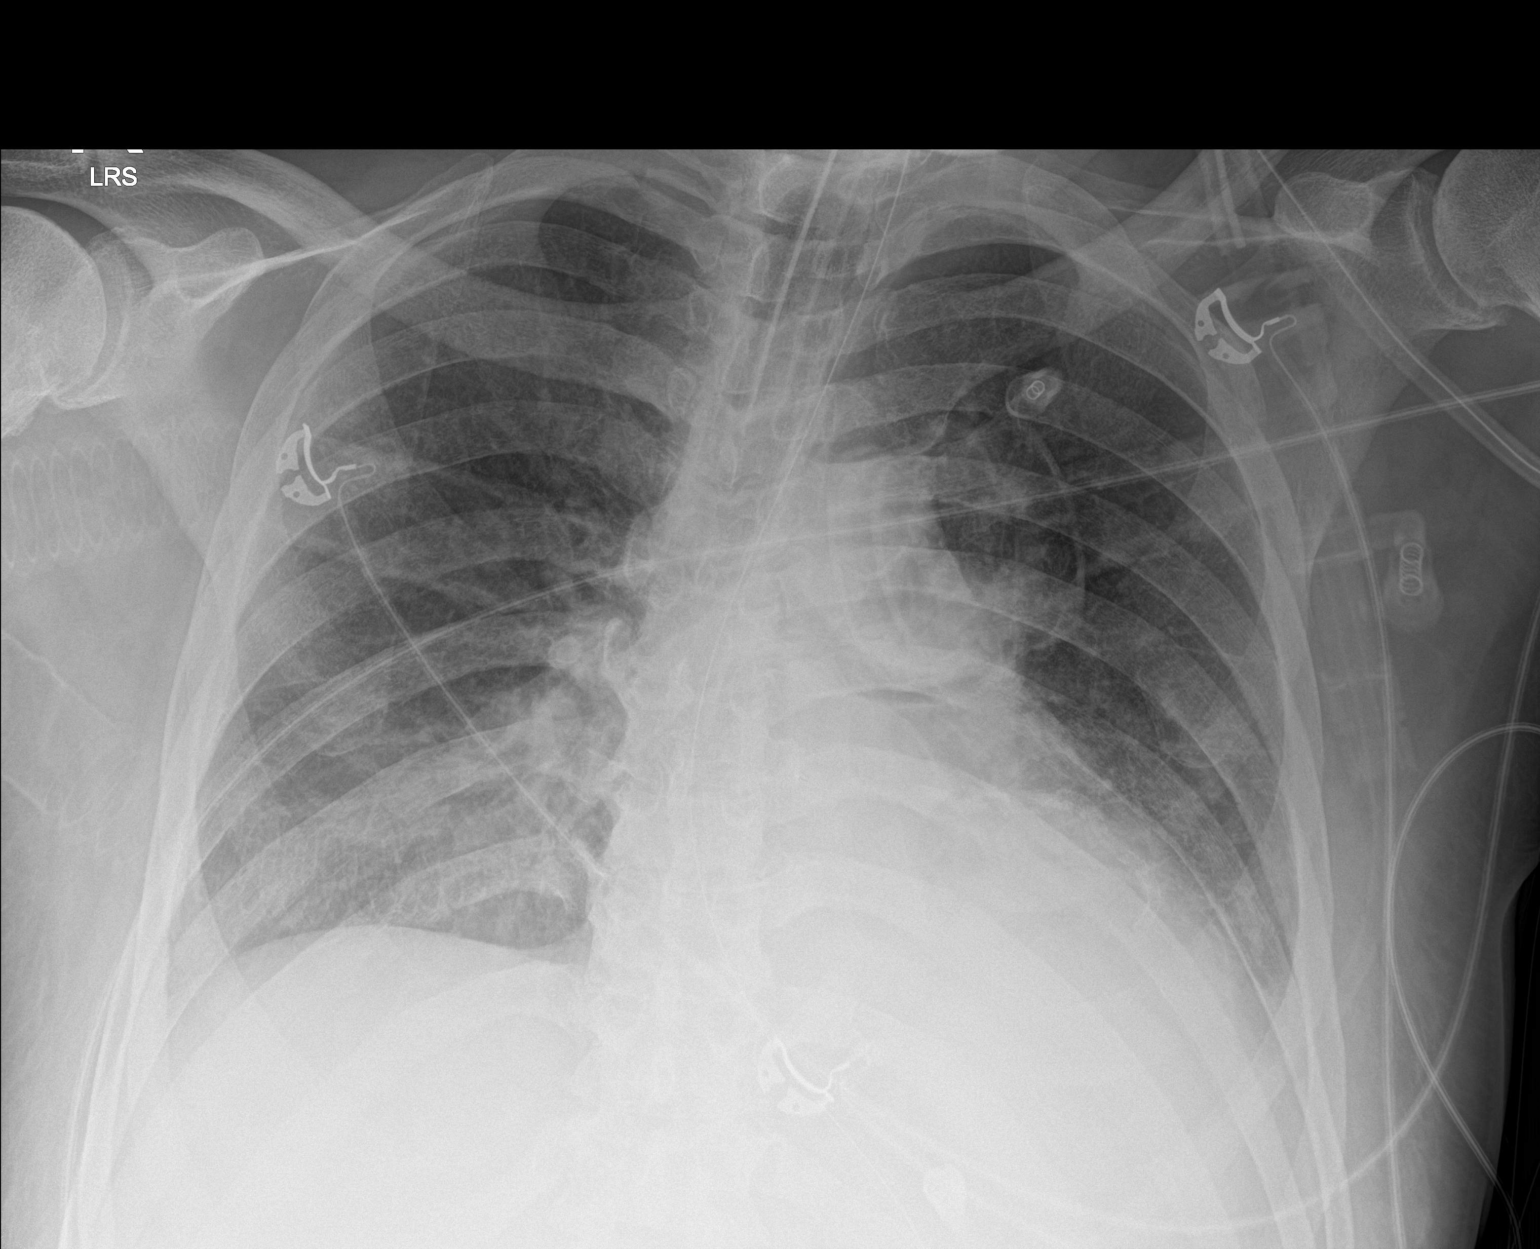

[1 of 1 positions shown; findings below may reference images not displayed]

FINDINGS: Cardiac shadow is stable. Endotracheal tube and gastric catheter are
again seen and stable. Coronary stenting is noted. The lungs are
well aerated bilaterally. Persistent bibasilar infiltrate left
greater than right is noted. No new focal abnormality is seen.
IMPRESSION: Stable bibasilar infiltrates left greater than right.

Tubes and lines in satisfactory position.

## 2019-07-12 IMAGING — DX PORTABLE ABDOMEN - 1 VIEW
1 series · 2 of 2 positions shown · non-contrast
Comparison: 08/07/2018

CLINICAL DATA: Evaluate new placement of OG tube.

EXAM:
PORTABLE ABDOMEN - 1 VIEW

[Series 1: abdomen · 0.14mm/px · 2 of 2 slices shown]
[im 1/2]
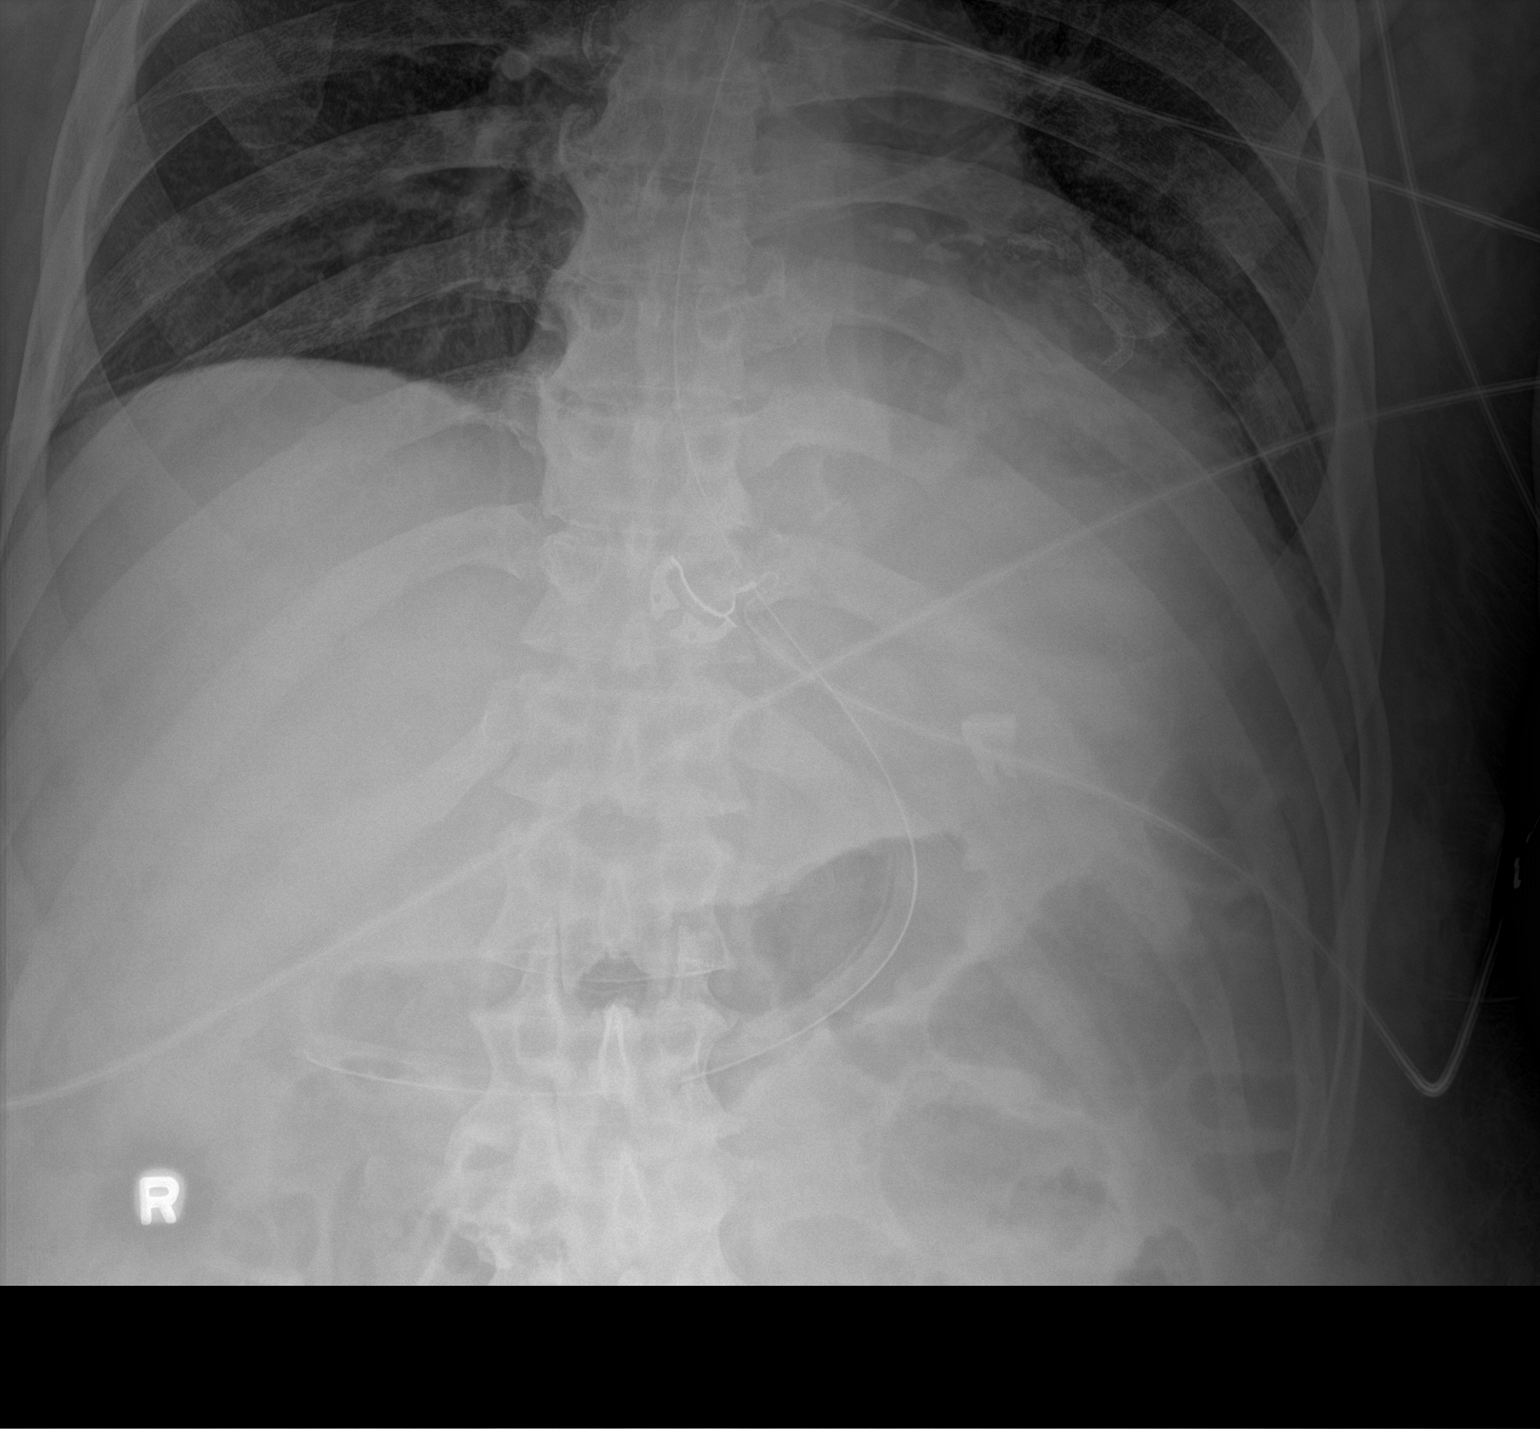
[im 2/2]
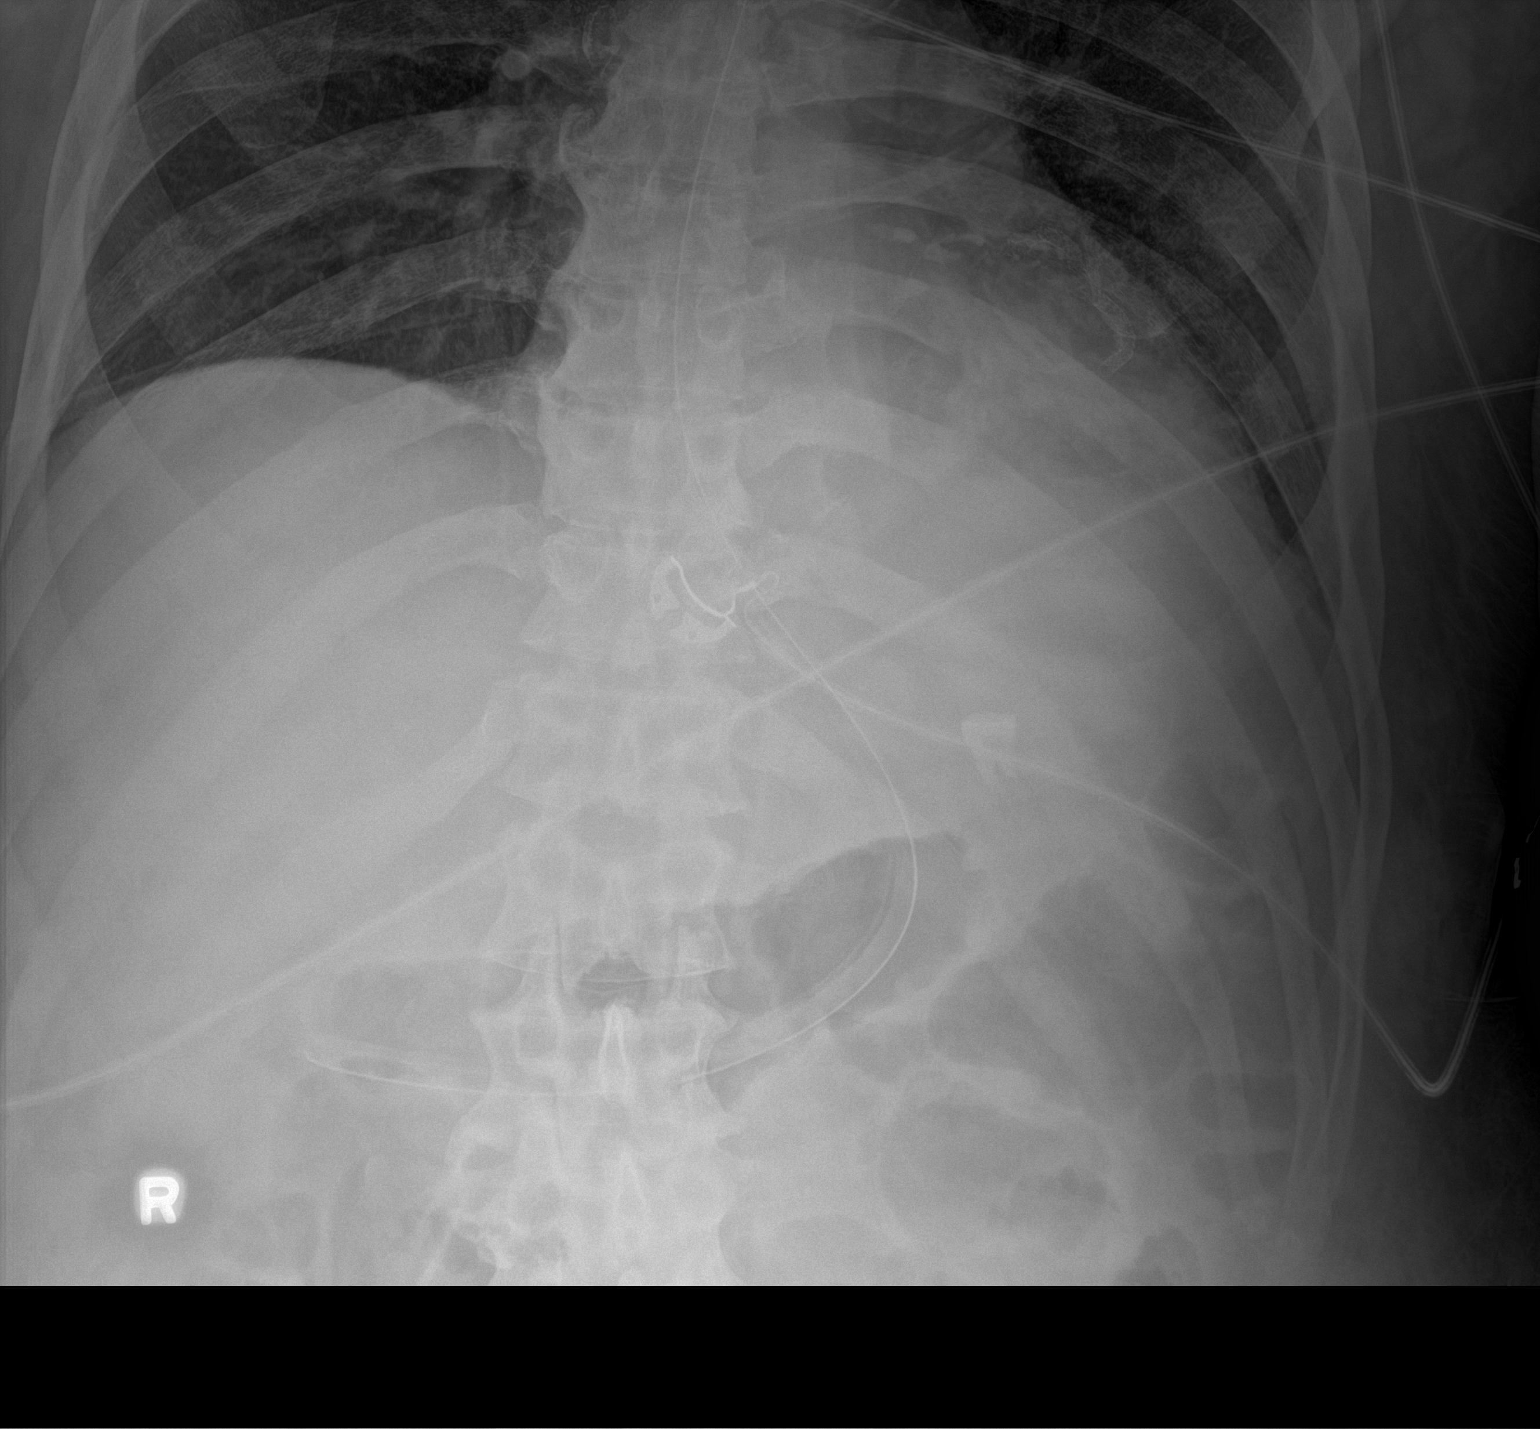

[2 of 2 positions shown; findings below may reference images not displayed]

FINDINGS: The tip of an orogastric tube is seen crossing midline into the
right upper quadrant of the abdomen possibly in the expected
location of the gastric antrum/gastric pylorus or duodenal bulb.
Mild dilatation of included small bowel loops. Cardiomegaly with
patchy airspace disease, slightly improved at the right base is
noted.
IMPRESSION: 1. Satisfactory orogastric tube position.
2. Cardiomegaly patchy airspace disease left greater than right,
improved at the right lung base since prior.

## 2019-07-13 IMAGING — DX PORTABLE CHEST - 1 VIEW
1 series · 1 of 1 positions shown · non-contrast
Comparison: Yesterday

CLINICAL DATA: Acute on chronic respiratory failure with hypoxia

EXAM:
PORTABLE CHEST 1 VIEW

[chest ap]
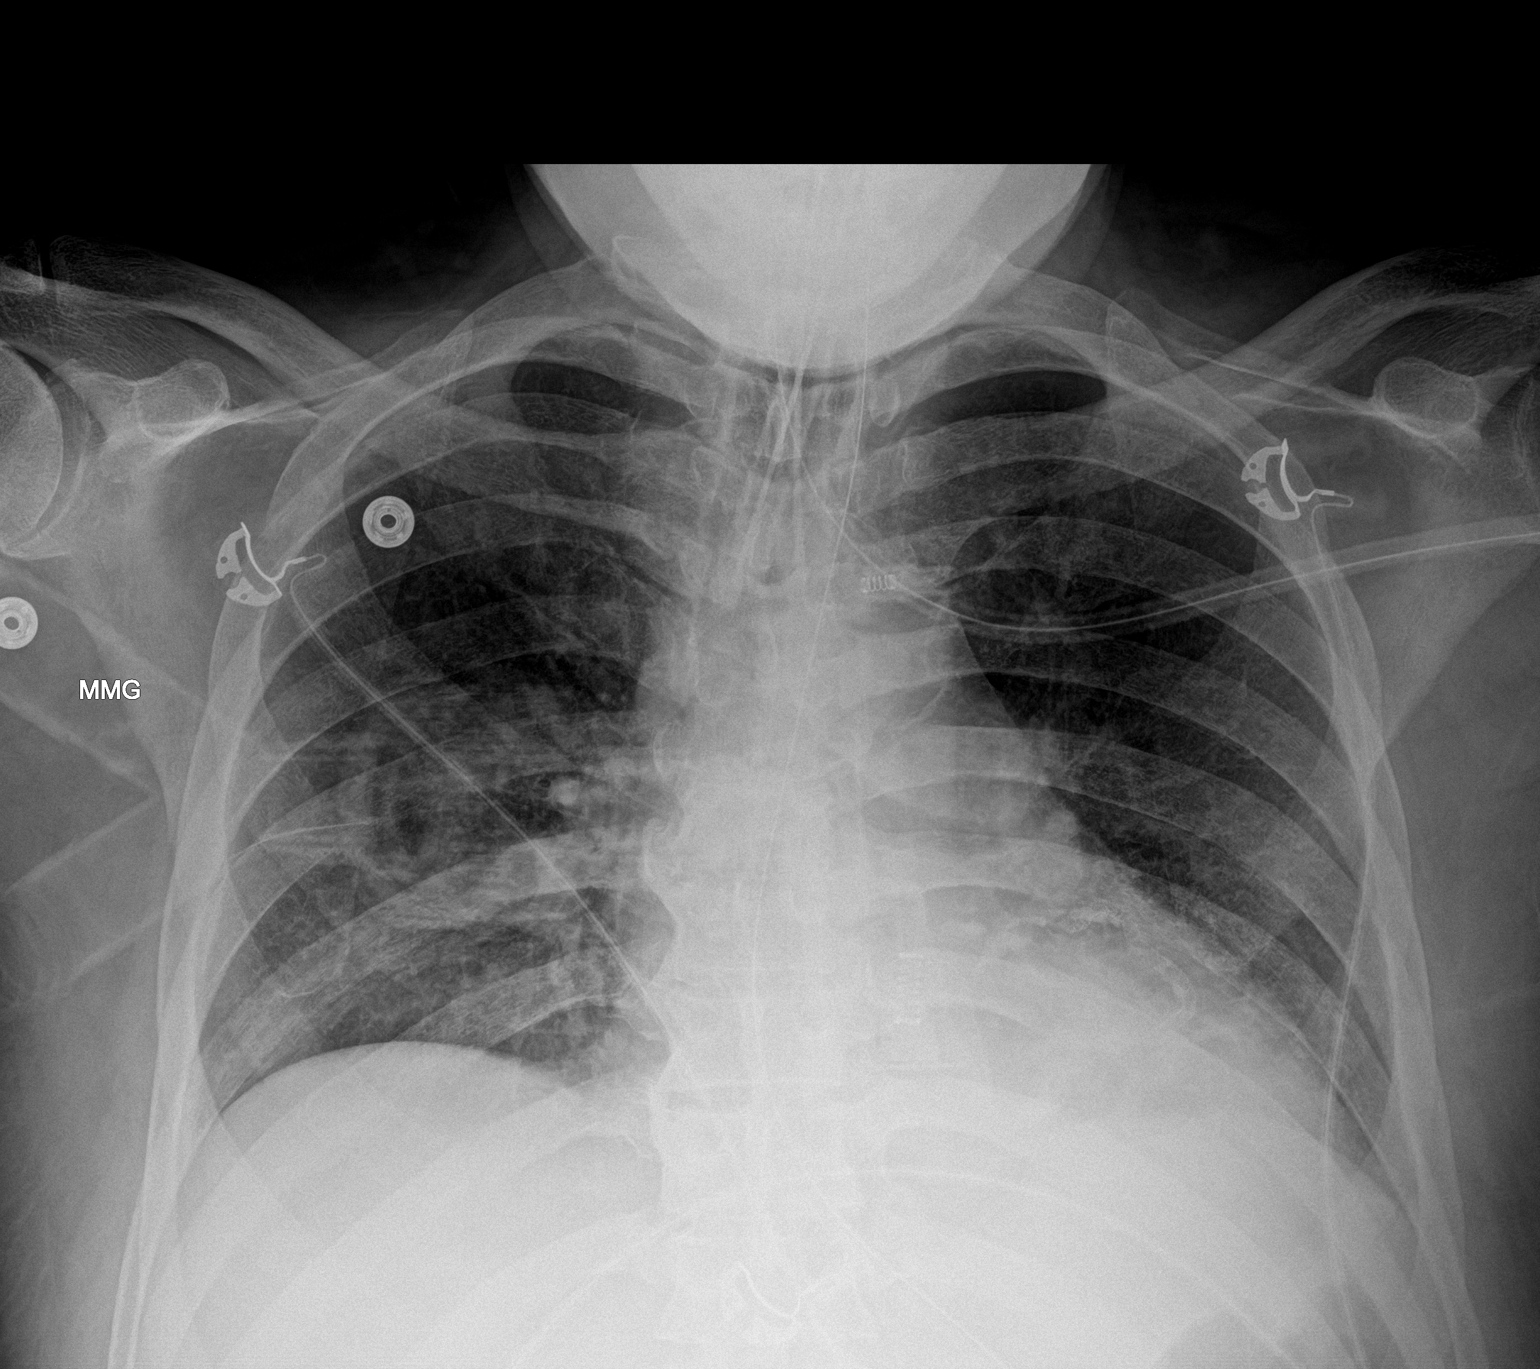

[1 of 1 positions shown; findings below may reference images not displayed]

FINDINGS: Endotracheal tube tip just below the clavicular heads. The
orogastric tube reaches the stomach.

Low volume chest with airspace opacity primarily at the left base.
Cardiomegaly. Coronary stenting. No effusion or pneumothorax.
IMPRESSION: 1. Pneumonia with stable asymmetric left base opacification.
2. Stable hardware positioning.

## 2019-07-16 IMAGING — CT CT OF THE RIGHT WRIST WITHOUT CONTRAST
1 series · 16 of 21 positions shown, 20 images · non-contrast
Comparison: 08/12/2018

CLINICAL DATA: Wrist injury and wrist pain.

EXAM:
CT OF THE RIGHT WRIST WITHOUT CONTRAST
TECHNIQUE: Multidetector CT imaging of the right wrist was performed according
to the standard protocol. Multiplanar CT image reconstructions were
also generated.

[Series 205: mpr · sagittal · 0.25mm/px · 16 of 35 slices shown, 20 images]
[im 3/35  soft-tissue]
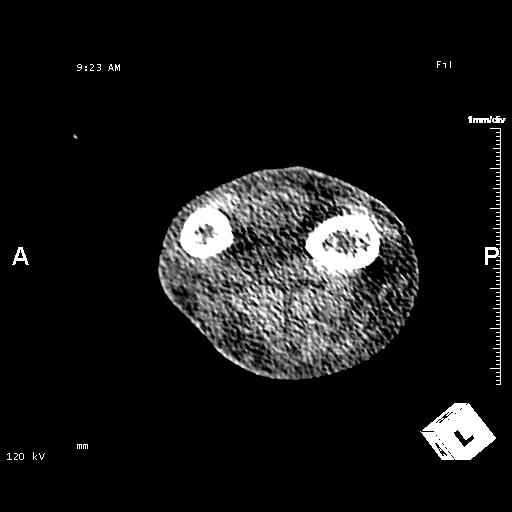
[im 3/35  bone]
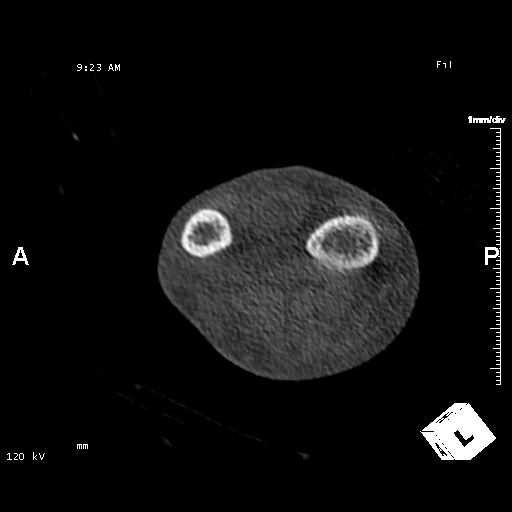
[im 6/35  bone]
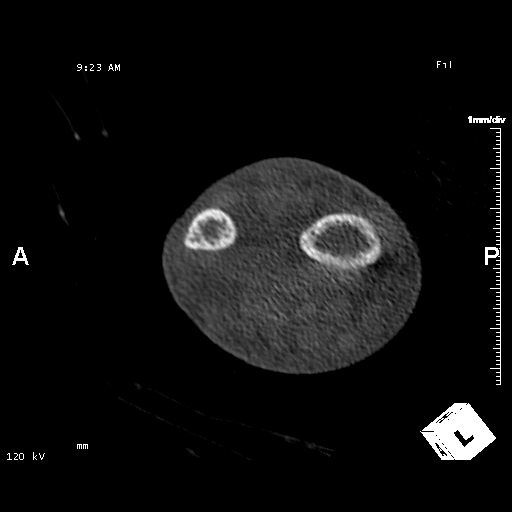
[im 8/35  bone]
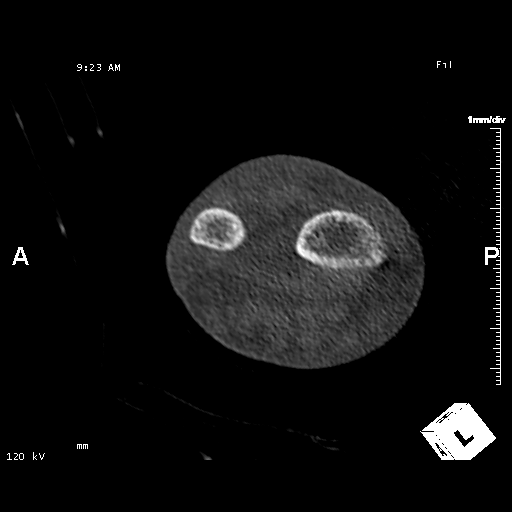
[im 11/35  bone]
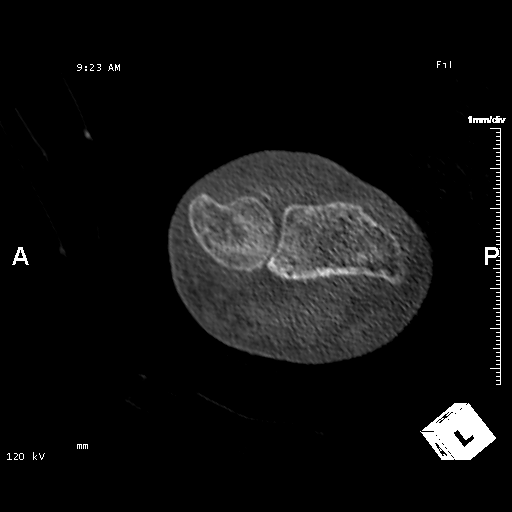
[im 12/35  soft-tissue]
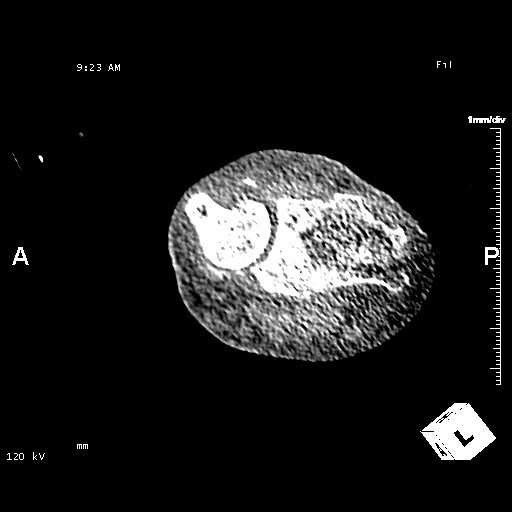
[im 12/35  bone]
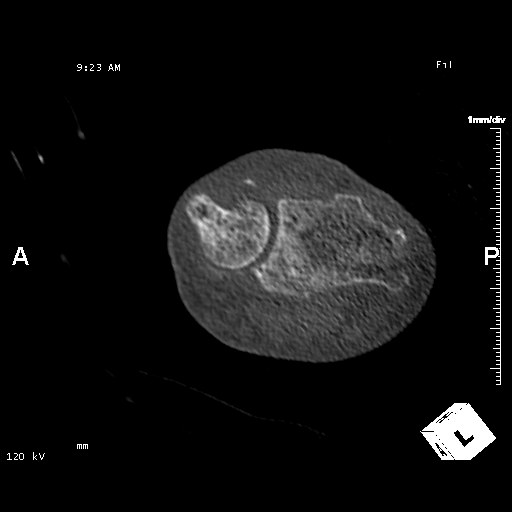
[im 14/35  bone]
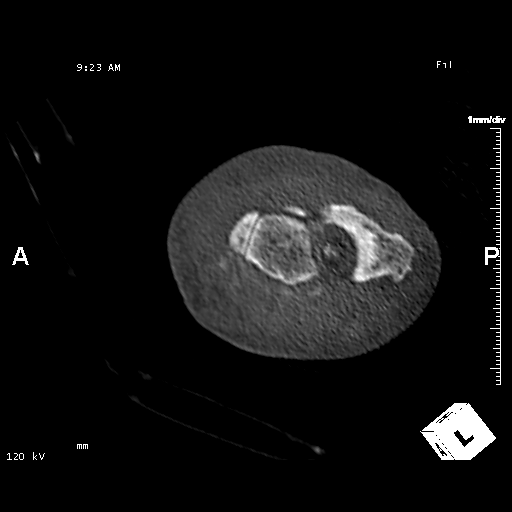
[im 15/35  bone]
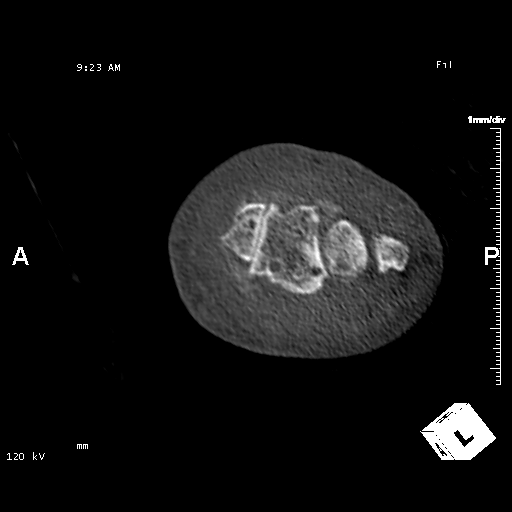
[im 16/35  bone]
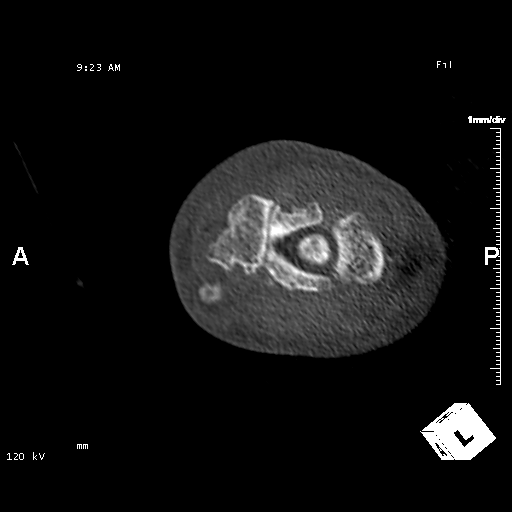
[im 19/35  soft-tissue]
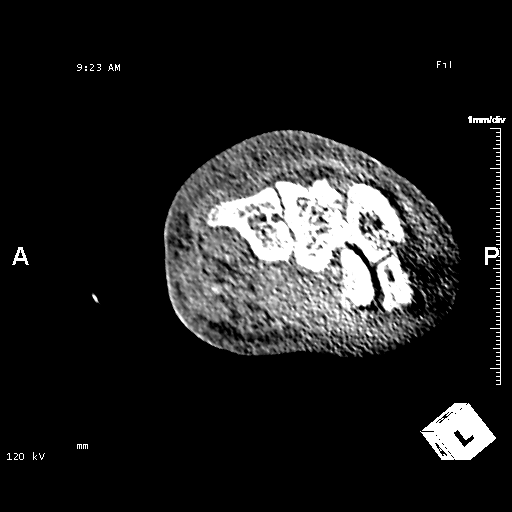
[im 19/35  bone]
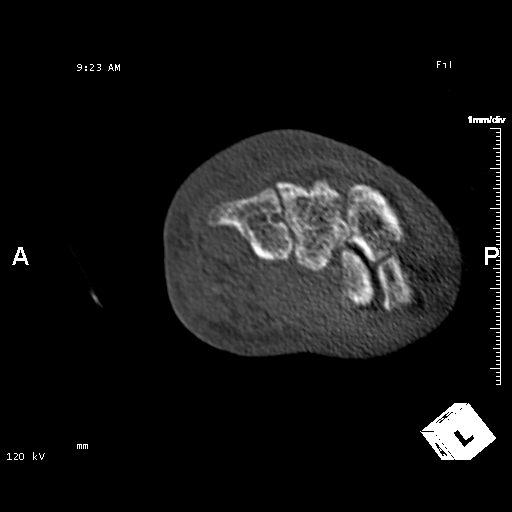
[im 20/35  bone]
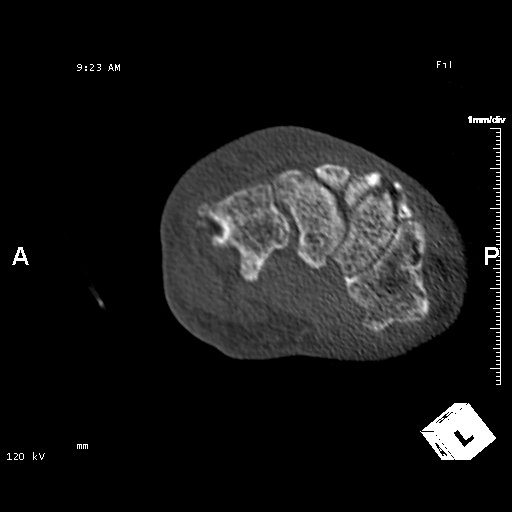
[im 21/35  bone]
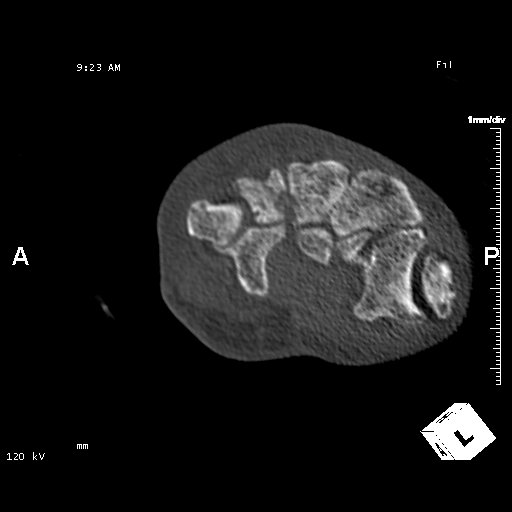
[im 23/35  bone]
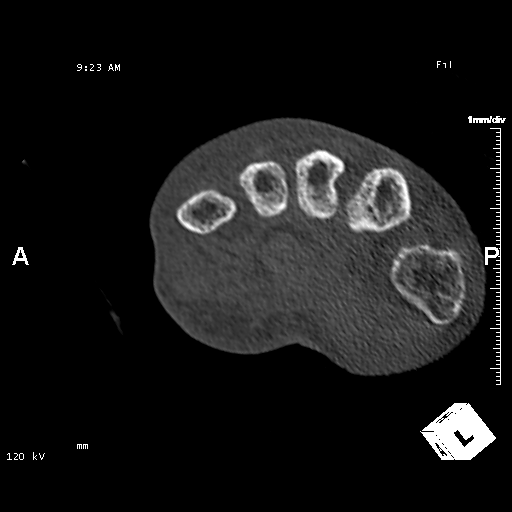
[im 24/35  soft-tissue]
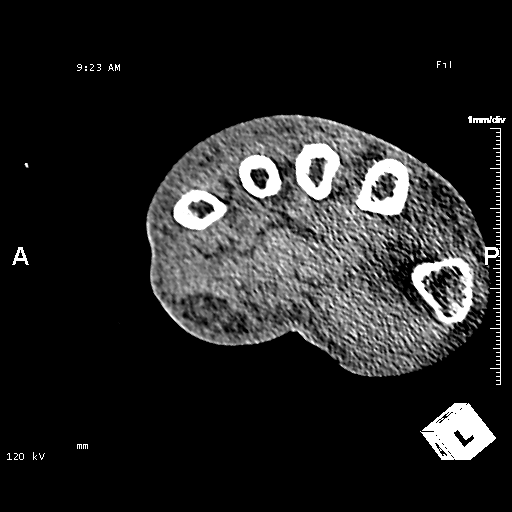
[im 24/35  bone]
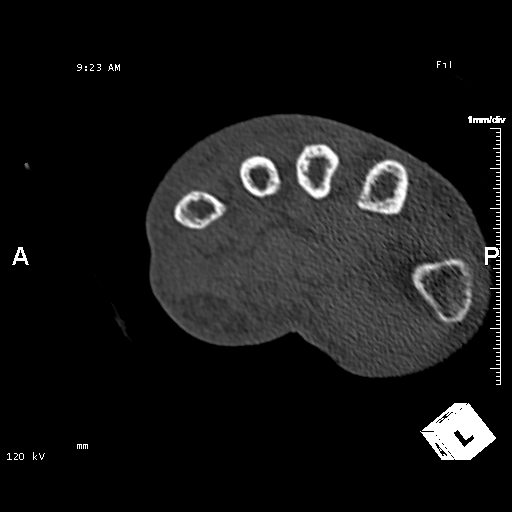
[im 27/35  bone]
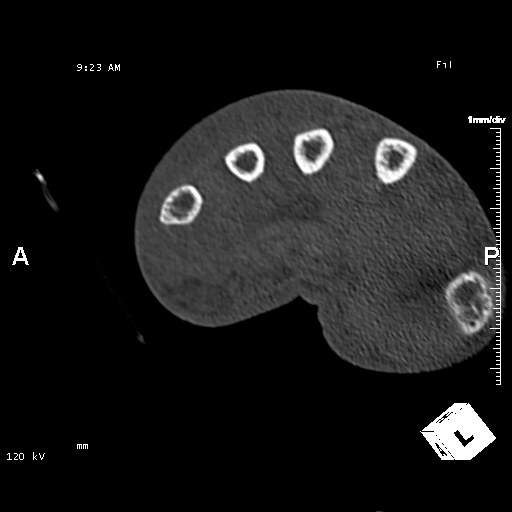
[im 29/35  bone]
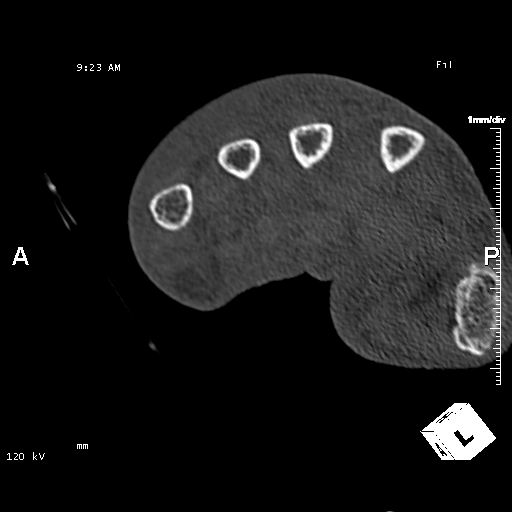
[im 32/35  bone]
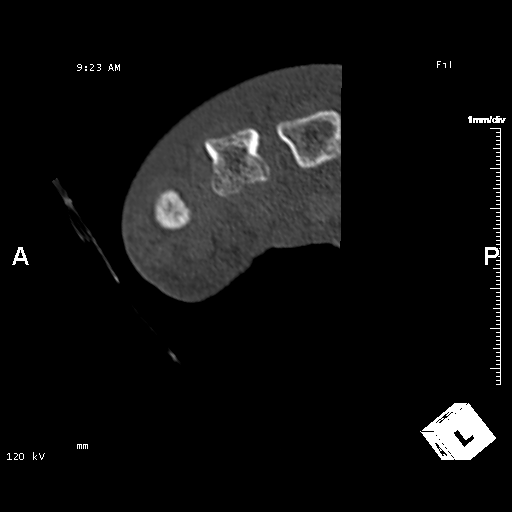

[16 of 21 positions shown; findings below may reference images not displayed]

FINDINGS: Bones/Joint/Cartilage

Chondrocalcinosis observed notable the margins of the radiocarpal
joint and along the scapholunate ligament region. There is
chondrocalcinosis of the TFCC disc.

Prominent loss of articular space the lunatotriquetral articulation.
There is also degenerative loss of articular space in various
portions the carpus, for example between the trapezium and
trapezoid.

Suspected erosion along the dorsal lunate on image 180/208. No
fracture is identified.

Ligaments

Suboptimally assessed by CT.

Muscles and Tendons

Grossly unremarkable

Soft tissues

Grossly unremarkable
IMPRESSION: 1. Chondrocalcinosis with some erosions, possibly CPPD arthropathy.
2. No appreciable fracture.

## 2019-08-09 ENCOUNTER — Other Ambulatory Visit: Payer: Medicare Other

## 2019-08-16 ENCOUNTER — Other Ambulatory Visit: Payer: Self-pay

## 2019-08-16 ENCOUNTER — Telehealth: Payer: Self-pay

## 2019-08-16 ENCOUNTER — Ambulatory Visit: Payer: Medicare Other

## 2019-08-16 DIAGNOSIS — I6523 Occlusion and stenosis of bilateral carotid arteries: Secondary | ICD-10-CM

## 2019-08-16 NOTE — Telephone Encounter (Signed)
Will discuss during upcoming visit on 4/9.  Thanks MJP

## 2019-08-17 NOTE — Telephone Encounter (Signed)
DISREGARD

## 2019-08-19 ENCOUNTER — Ambulatory Visit: Payer: Medicare Other | Admitting: Cardiology

## 2019-08-21 NOTE — Progress Notes (Signed)
Carotid artery duplex  08/16/2019: Minimal stenosis in the right internal carotid artery (1-15%). Stenosis in the right external carotid artery (<50%). Senosis in the left internal carotid artery (50-69%). Stenosis in the left external carotid artery (>50%). Antegrade right vertebral artery flow. Antegrade left vertebral artery flow. Follow up in six months is appropriate if clinically indicated. Compared to 02/01/2019, right ICA stenosis of 15-49% not seen and no change in left ICA stenosis.

## 2019-08-26 ENCOUNTER — Other Ambulatory Visit (HOSPITAL_COMMUNITY): Payer: Self-pay | Admitting: Cardiology

## 2019-08-26 ENCOUNTER — Ambulatory Visit (INDEPENDENT_AMBULATORY_CARE_PROVIDER_SITE_OTHER): Payer: Medicare Other | Admitting: Cardiology

## 2019-08-26 ENCOUNTER — Encounter: Payer: Self-pay | Admitting: Cardiology

## 2019-08-26 ENCOUNTER — Other Ambulatory Visit: Payer: Self-pay

## 2019-08-26 VITALS — BP 168/80 | HR 66 | Temp 98.2°F | Ht 74.0 in | Wt 228.0 lb

## 2019-08-26 DIAGNOSIS — I6523 Occlusion and stenosis of bilateral carotid arteries: Secondary | ICD-10-CM

## 2019-08-26 DIAGNOSIS — I5032 Chronic diastolic (congestive) heart failure: Secondary | ICD-10-CM

## 2019-08-26 DIAGNOSIS — I2510 Atherosclerotic heart disease of native coronary artery without angina pectoris: Secondary | ICD-10-CM

## 2019-08-26 NOTE — Progress Notes (Signed)
Subjective:   David [name], male    DOB: 1945/11/22, 74 y.o.   MRN: [id number]    Chief complaint:  Leg swelling   HPI  74 year old African-American male with hypertension, type 2 DM, CKD 3, CAD s/p complex LAD PCI 03/2017, mod carotid stenosis, HFpEF, WHO grp II pulmonary hypertension.  He complains about having to urinate frequently while taking torsemide. However, he has stability in his volume status while on torsemide, and has not had any worsening of his exertional dyspnea. He denies chest pain, shortness of breath, palpitations, leg edema, orthopnea, PND, TIA/syncope.   Current Outpatient Medications on File Prior to Visit  Medication Sig Dispense Refill  . albuterol (PROVENTIL HFA;VENTOLIN HFA) 108 (90 Base) MCG/ACT inhaler Inhale 2 puffs into the lungs every 6 (six) hours as needed for wheezing or shortness of breath. 1 Inhaler 2  . amLODipine (NORVASC) 10 MG tablet Take 1 tablet (10 mg total) by mouth daily. 90 tablet 3  . amLODipine (NORVASC) 5 MG tablet TAKE 1 TABLET BY MOUTH EVERY DAY 30 tablet 5  . aspirin EC 81 MG tablet Take 81 mg by mouth daily.    Marland Kitchen atorvastatin (LIPITOR) 40 MG tablet TAKE 1 TABLET BY MOUTH DAILY 90 tablet 3  . BIDIL 20-37.5 MG tablet TAKE 2 TABLETS BY MOUTH 2 TIMES A DAY. 120 tablet 4  . dicyclomine (BENTYL) 10 MG capsule Take 1 capsule (10 mg total) by mouth 4 (four) times daily -  before meals and at bedtime. (Patient taking differently: Take 10 mg by mouth 3 (three) times daily before meals. ) 10 capsule 0  . dorzolamide-timolol (COSOPT) 22.3-6.8 MG/ML ophthalmic solution Place 1 drop into both eyes 2 (two) times daily.  98  . Ensure Max Protein (ENSURE MAX PROTEIN) LIQD Take 330 mLs (11 oz total) by mouth 2 (two) times daily. 330 mL 0  . ferrous sulfate 325 (65 FE) MG tablet Take 325 mg by mouth daily with breakfast.    . gabapentin (NEURONTIN) 600 MG tablet Take 600 mg by mouth 2 (two) times daily.     Marland Kitchen guaiFENesin (MUCINEX) 600 MG 12  hr tablet Take 1 tablet (600 mg total) by mouth 2 (two) times daily. 10 tablet 0  . HUMALOG KWIKPEN 100 UNIT/ML KiwkPen Inject 4 Units into the skin 3 (three) times daily.  4  . HYDROcodone-acetaminophen (NORCO) 10-325 MG tablet Take 1 tablet by mouth 2 (two) times a day.     . Insulin Detemir (LEVEMIR FLEXTOUCH) 100 UNIT/ML Pen Inject 20 Units into the skin daily. (Patient taking differently: Inject 30 Units into the skin daily. )    . methocarbamol (ROBAXIN) 750 MG tablet Take 1 tablet (750 mg total) by mouth every 6 (six) hours as needed for muscle spasms. 30 tablet 0  . metoprolol succinate (TOPROL-XL) 100 MG 24 hr tablet Take 1 tablet (100 mg total) by mouth daily. Take with or immediately following a meal. 30 tablet 5  . Multiple Vitamin (MULTIVITAMIN WITH MINERALS) TABS tablet Take 1 tablet by mouth daily. 30 tablet 0  . mupirocin ointment (BACTROBAN) 2 % Apply 1 application topically daily. 22 g 0  . nitroGLYCERIN (NITROSTAT) 0.4 MG SL tablet Place 1 tablet (0.4 mg total) under the tongue every 5 (five) minutes as needed for chest pain. 30 tablet 3  . saccharomyces boulardii (FLORASTOR) 250 MG capsule Take 1 capsule (250 mg total) by mouth 2 (two) times daily.    Marland Kitchen torsemide (DEMADEX) 20 MG  tablet TAKE 1 TABLET (20 MG TOTAL) BY MOUTH 2 (TWO) TIMES DAILY. 60 tablet 5  . traZODone (DESYREL) 50 MG tablet Take 100 mg by mouth at bedtime.      No current facility-administered medications on file prior to visit.    Cardiovascular studies:  EKG 08/26/2019: Sinus rhythm 64 bpm. First degree A-V block   Carotid artery duplex 08/16/2019: Minimal stenosis in the right internal carotid artery (1-15%). Stenosis in the right external carotid artery (<50%). Senosis in the left internal carotid artery (50-69%). Stenosis in the left external carotid artery (>50%). Antegrade right vertebral artery flow. Antegrade left vertebral artery flow. Follow up in six months is appropriate if clinically  indicated. Compared to 02/01/2019, right ICA stenosis of 15-49% not seen and no change in left ICA stenosis.  Hospital Echocardiogram 07/19/2018:  1. The left ventricle has low normal systolic function, with an ejection fraction of 50-55%. There is concentric left ventricular hypertrophy. No evidence of left ventricular regional wall motion abnormalities. Diastolic function not assessed.  2. The right ventricle has normal systolc function. There is no increase in right ventricular wall thickness.  3. Mild tricuspid regurgitation. This is a limited study and inadequate to assess RV systolic pressure.  4. Unlike previous study in 12/2017, pulmonary hypertension and diastolic dysfunction not adequately evaluated in this limited study. No other significant change noted.  Coronary intervention 04/06/2017: Successful complex PTCA and overlapping stents mid LAD Complexity due to severe calcification and severe tortuosity Resolute 3.0 X 18 mm Synergy 3.0 X 16 mm Residual 10% unde expansion of proximal stent  dLAD 2.75 X 26 mm Resolure DES  Recent labs: 10/12/2018: Glucose 247, BUN/Cr 23/1.85. EGFR 41. Na/K 139/4.8. Rest of the CMP normal H/H 8.0/23.6. MCV 108. Platelets 206   Review of Systems  Cardiovascular: Positive for leg swelling. Negative for chest pain, dyspnea on exertion, palpitations and syncope.         Vitals:   08/26/19 1422 08/26/19 1423  BP: (!) 175/75 (!) 168/80  Pulse: 66 66  Temp: 98.2 F (36.8 C)   SpO2: 97%      Objective:    Physical Exam  Constitutional: No distress.  Neck: No JVD present.  Cardiovascular: Normal rate, regular rhythm and intact distal pulses.  Pulses:      Carotid pulses are on the left side with bruit. Pulmonary/Chest: Effort normal and breath sounds normal. He has no wheezes. He has no rales.  Abdominal: Soft. Bowel sounds are normal. There is no rebound.  Musculoskeletal:        General: No edema.  Neurological: No cranial nerve  deficit.  Psychiatric: He has a normal mood and affect.  Nursing note and vitals reviewed.         Assessment & Recommendations:   74 year old African-American male with hypertension, type 2 DM, CKD 3, CAD s/p complex LAD PCI 03/2017, mod carotid stenosis, HFpEF, WHO grp II pulmonary hypertension.  HFpEF: Stable, euvolumic. Continue current management.  CAD: Stable. No current angina.  Continue current management. .  Pulmonary hypertension: WHO Grp II, possibly out of proportion. Continue managmenet with diuretics at this time.  Hypertension: Uncontrolled. Will arrange remote patient monitoring. If BP remains elevated, could increase Bidil 3 times daily or increase dose of amlodipine.   Moderate carotid stenoses: Asymptomatic. Continue risk factor modification.   Anemia: Stable. Likely due to CKD. Continue iron supplementation. Follow up with PCP.  Will check labs today- BMP, lipid panel.   Nigel Mormon, MD  [facility] Pager: [phone] Office: [phone] If no answer Cell [phone]

## 2019-08-27 LAB — BASIC METABOLIC PANEL
BUN/Creatinine Ratio: 14 (ref 10–24)
BUN: 32 mg/dL — ABNORMAL HIGH (ref 8–27)
CO2: 26 mmol/L (ref 20–29)
Calcium: 8.8 mg/dL (ref 8.6–10.2)
Chloride: 96 mmol/L (ref 96–106)
Creatinine, Ser: 2.34 mg/dL — ABNORMAL HIGH (ref 0.76–1.27)
GFR calc Af Amer: 31 mL/min/{1.73_m2} — ABNORMAL LOW (ref >59)
GFR calc non Af Amer: 27 mL/min/{1.73_m2} — ABNORMAL LOW (ref >59)
Glucose: 421 mg/dL — ABNORMAL HIGH (ref 65–99)
Potassium: 5.1 mmol/L (ref 3.5–5.2)
Sodium: 135 mmol/L (ref 134–144)

## 2019-10-01 ENCOUNTER — Other Ambulatory Visit: Payer: Self-pay | Admitting: Cardiology

## 2019-10-04 ENCOUNTER — Ambulatory Visit: Payer: Medicare Other | Admitting: Family

## 2019-10-04 ENCOUNTER — Other Ambulatory Visit: Payer: Self-pay

## 2019-10-04 ENCOUNTER — Encounter: Payer: Self-pay | Admitting: Family

## 2019-10-04 DIAGNOSIS — B351 Tinea unguium: Secondary | ICD-10-CM

## 2019-10-04 DIAGNOSIS — M6701 Short Achilles tendon (acquired), right ankle: Secondary | ICD-10-CM

## 2019-10-04 DIAGNOSIS — L97511 Non-pressure chronic ulcer of other part of right foot limited to breakdown of skin: Secondary | ICD-10-CM

## 2019-10-04 DIAGNOSIS — E1142 Type 2 diabetes mellitus with diabetic polyneuropathy: Secondary | ICD-10-CM

## 2019-10-04 NOTE — Progress Notes (Signed)
Office Visit Note   Patient: David [name]           Date of Birth: 21-Feb-1946           MRN: [id number] Visit Date: 10/04/2019              Requested by: [name], MD [address] [place],  [place] [address] PCP: [name], MD  Chief Complaint  Patient presents with  . Right Foot - Follow-up      HPI: The patient is a 74 year old gentleman seen today in follow-up, concerned for worsening of right foot ulcer.  He has been having increasing pain to the wound beneath his first metatarsal head on the right with weightbearing.  Has not had any change in size of his ulcer he is just concerned for increased pain.  No fever no chills  Has been wearing Vive compression hose with direct skin contact, bilaterally. In regular shoewear.  Assessment & Plan: Visit Diagnoses:  1. Right foot ulcer, limited to breakdown of skin ([place])   2. Acquired contracture of Achilles tendon, right   3. Onychomycosis   4. Diabetic polyneuropathy associated with type 2 diabetes mellitus ([place])     Plan: He will continue with his compression garments daily.  Mupirocin dressings to right foot ulcer. Encouraged use of custom orthotics and pressure relieving padding. Minimize weight bearing and pressure to wound area. He will follow-up in 4 weeks or sooner should he have difficulty in the interim.  Follow-Up Instructions: Return in about 3 weeks (around 10/25/2019), or if symptoms worsen or fail to improve.   Ortho Exam  Patient is alert, oriented, no adenopathy, well-dressed, normal affect, normal respiratory effort.  On examination of the right lower extremity he continues with ulceration beneath the first metatarsal head this is 2 cm in diameter.  This was debrided with a 10 blade knife back to viable tissue.  There is open central ulcer of 1 cm in diameter this is 2 mm deep.  There is some surrounding epiboly there is no active drainage no surrounding erythema no sign of infection.   He does have some superficial callus to the fifth metatarsal head this was debrided with a 10 blade knife as well.  On examination of the left lower extremity he has no open ulcerations.  The great toe is quite thickened and discolored onychomycotic.  Onychomycotic nails were trimmed today x5.  Patient is unable to safely trim his nails.    Imaging: No results found.   Labs: Lab Results  Component Value Date   HGBA1C 7.1 (H) 05/28/2018   HGBA1C 6.3 (H) 12/19/2017   HGBA1C 7.5 (H) 11/24/2017   ESRSEDRATE 73 (H) 02/07/2015   ESRSEDRATE 22 (H) 09/15/2013   CRP 8.2 (H) 02/07/2015   LABURIC 4.4 08/12/2018   REPTSTATUS 08/16/2018 FINAL 08/15/2018   GRAMSTAIN  08/07/2018    MODERATE WBC PRESENT, PREDOMINANTLY PMN NO ORGANISMS SEEN    CULT MULTIPLE SPECIES PRESENT, SUGGEST RECOLLECTION (A) 08/15/2018     Lab Results  Component Value Date   ALBUMIN 3.7 09/10/2018   ALBUMIN 2.5 (L) 08/15/2018   ALBUMIN 2.4 (L) 08/14/2018   LABURIC 4.4 08/12/2018    There is no height or weight on file to calculate BMI.  Orders:  No orders of the defined types were placed in this encounter.  No orders of the defined types were placed in this encounter.    Procedures: No procedures performed  Clinical Data: No additional  findings.  ROS:  All other systems negative, except as noted in the HPI. Review of Systems  Constitutional: Negative for chills and fever.  Cardiovascular: Negative for leg swelling.  Skin: Positive for wound. Negative for color change.    Objective: Vital Signs: There were no vitals taken for this visit.  Specialty Comments:  No specialty comments available.  PMFS History: Patient Active Problem List   Diagnosis Date Noted  . Mixed hyperlipidemia 10/15/2018  . Bilateral leg edema 09/17/2018  . CKD (chronic kidney disease) stage 3, GFR 30-59 ml/min 09/10/2018  . Anemia 09/10/2018  . Pain and swelling of wrist, right 08/14/2018  . Bilateral carotid artery  stenosis 07/30/2018  . Coronary artery disease involving native coronary artery of native heart without angina pectoris 07/30/2018  . Snoring 02/11/2018  . Chronic heart failure with preserved ejection fraction (HFpEF) ([place]) 12/22/2017  . At risk for adverse drug reaction 12/15/2017  . Chronic pain syndrome 12/15/2017  . Status post coronary artery stent placement   . Multifocal pneumonia 03/30/2017  . Insulin-requiring or dependent type II diabetes mellitus ([place]) 03/29/2017  . Acute on chronic respiratory failure with hypoxia ([place])   . Pulmonary congestion   . Acquired contracture of Achilles tendon, right 08/19/2016  . Amputated great toe, right ([place]) 07/18/2016  . Onychomycosis 05/29/2016  . Diabetic polyneuropathy associated with type 2 diabetes mellitus ([place]) 04/09/2016  . Right foot ulcer, limited to breakdown of skin ([place]) 04/09/2016  . Unspecified hereditary and idiopathic peripheral neuropathy 09/15/2013  . Malaise 08/14/2013  . Essential hypertension 08/14/2013  . Chronic back pain 08/14/2013  . Glaucoma 08/14/2013  . Headache 08/14/2013   Past Medical History:  Diagnosis Date  . Arthritis   . CAD in native artery    s/p stent in 11/18  . CHF (congestive heart failure) ([place])    normal echo in 11/18  . CKD (chronic kidney disease)   . DDD (degenerative disc disease), cervical   . Diabetes mellitus with complication (HCC)    Type II  . Diabetic neuropathy ([place])   . Diabetic retinopathy ([place])   . GERD (gastroesophageal reflux disease)   . Glaucoma   . Hypertension     Family History  Problem Relation Age of Onset  . Diabetes Other   . Hyperlipidemia Other   . Hypertension Other   . Stroke Other   . Alzheimer's disease Other   . Thyroid disease Mother   . Diabetes Mellitus II Father   . Alzheimer's disease Father     Past Surgical History:  Procedure Laterality Date  . AMPUTATION Right 07/09/2016   Procedure: Right Great Toe Amputation at Metatarsophalangeal  Joint;  Surgeon: [name], MD;  Location: [facility];  Service: Orthopedics;  Laterality: Right;  . AMPUTATION Right 05/28/2018   Procedure: RIGHT SECOND TOE AMPUTATION;  Surgeon: [name], MD;  Location: [facility];  Service: Orthopedics;  Laterality: Right;  . BACK SURGERY     4  . CARDIAC CATHETERIZATION    . CORONARY STENT INTERVENTION N/A 04/06/2017   Procedure: CORONARY STENT INTERVENTION;  Surgeon: Nigel Mormon, MD;  Location: [facility];  Service: Cardiovascular;  Laterality: N/A;  . CORONARY STENT INTERVENTION N/A 04/07/2017   Procedure: CORONARY STENT INTERVENTION;  Surgeon: Nigel Mormon, MD;  Location: [facility];  Service: Cardiovascular;  Laterality: N/A;  . CYST EXCISION     on Back  . ENDOTRACHEAL INTUBATION EMERGENT  08/07/2018      .  JOINT REPLACEMENT    . LAPAROSCOPIC ABDOMINAL EXPLORATION N/A 11/26/2017   Procedure: LAPAROSCOPIC ABDOMINAL EXPLORATION, DRAINAGE OF APPENDICEAL ABCESS. PLACEMENT OF DRAIN;  Surgeon: Alphonsa Overall, MD;  Location: [facility];  Service: General;  Laterality: N/A;  . RIGHT/LEFT HEART CATH AND CORONARY ANGIOGRAPHY N/A 04/06/2017   Procedure: RIGHT/LEFT HEART CATH AND CORONARY ANGIOGRAPHY;  Surgeon: Nigel Mormon, MD;  Location: [facility];  Service: Cardiovascular;  Laterality: N/A;  . TOE SURGERY  05/2018   Left   . TOTAL HIP ARTHROPLASTY     Right    Social History   Occupational History  . Occupation: Retired  Tobacco Use  . Smoking status: Never Smoker  . Smokeless tobacco: Never Used  Substance and Sexual Activity  . Alcohol use: Yes  . Drug use: No  . Sexual activity: Yes

## 2019-10-19 ENCOUNTER — Other Ambulatory Visit: Payer: Self-pay | Admitting: Cardiology

## 2019-10-19 DIAGNOSIS — I2510 Atherosclerotic heart disease of native coronary artery without angina pectoris: Secondary | ICD-10-CM

## 2019-10-27 ENCOUNTER — Other Ambulatory Visit: Payer: Self-pay | Admitting: Nephrology

## 2019-10-28 ENCOUNTER — Other Ambulatory Visit: Payer: Self-pay | Admitting: Nephrology

## 2019-10-28 DIAGNOSIS — N1832 Chronic kidney disease, stage 3b: Secondary | ICD-10-CM

## 2019-10-29 ENCOUNTER — Other Ambulatory Visit: Payer: Self-pay | Admitting: Cardiology

## 2019-10-29 DIAGNOSIS — I5030 Unspecified diastolic (congestive) heart failure: Secondary | ICD-10-CM

## 2019-11-30 ENCOUNTER — Other Ambulatory Visit: Payer: Self-pay | Admitting: Cardiology

## 2019-12-23 ENCOUNTER — Other Ambulatory Visit: Payer: Self-pay

## 2019-12-23 ENCOUNTER — Ambulatory Visit: Payer: Medicare Other | Admitting: Cardiology

## 2019-12-23 ENCOUNTER — Encounter: Payer: Self-pay | Admitting: Cardiology

## 2019-12-23 VITALS — BP 161/67 | HR 55 | Resp 17 | Ht 74.0 in | Wt 245.0 lb

## 2019-12-23 DIAGNOSIS — I2510 Atherosclerotic heart disease of native coronary artery without angina pectoris: Secondary | ICD-10-CM

## 2019-12-23 DIAGNOSIS — I5032 Chronic diastolic (congestive) heart failure: Secondary | ICD-10-CM

## 2019-12-23 DIAGNOSIS — I10 Essential (primary) hypertension: Secondary | ICD-10-CM

## 2019-12-23 DIAGNOSIS — E782 Mixed hyperlipidemia: Secondary | ICD-10-CM

## 2019-12-23 DIAGNOSIS — E1122 Type 2 diabetes mellitus with diabetic chronic kidney disease: Secondary | ICD-10-CM

## 2019-12-23 DIAGNOSIS — N1832 Chronic kidney disease, stage 3b: Secondary | ICD-10-CM

## 2019-12-23 DIAGNOSIS — Z794 Long term (current) use of insulin: Secondary | ICD-10-CM

## 2019-12-23 MED ORDER — LABETALOL HCL 200 MG PO TABS: 200.0000 mg | ORAL_TABLET | Freq: Two times a day (BID) | ORAL | 3 refills | Status: DC

## 2019-12-23 NOTE — Progress Notes (Signed)
Subjective:   David [name], male    DOB: 01-08-1946, 74 y.o.   MRN: [id number]    Chief complaint:  CAD, Hypertension   HPI  74 year old African-American male with hypertension, type 2 DM with complications including CKD 3, diabetic retinopathy and peripheral neuropathy, CAD s/p complex LAD PCI 03/2017, mod carotid stenosis, HFpEF, WHO grp II pulmonary hypertension.  Patient presents here for his 31-monthoffice visit.  He has not had any recurrence of angina pectoris, denies dyspnea and leg edema is essentially resolved.  He is tolerating all his medications well.  Accompanied by his wife.  Outpatient Medications Prior to Visit  Medication Sig Dispense Refill  . amLODipine (NORVASC) 5 MG tablet TAKE 1 TABLET BY MOUTH EVERY DAY 30 tablet 5  . aspirin EC 81 MG tablet Take 81 mg by mouth daily.    .Marland Kitchenatorvastatin (LIPITOR) 40 MG tablet TAKE 1 TABLET BY MOUTH DAILY 90 tablet 3  . BIDIL 20-37.5 MG tablet TAKE 2 TABLETS BY MOUTH 2 TIMES A DAY. 120 tablet 4  . dicyclomine (BENTYL) 10 MG capsule Take 1 capsule (10 mg total) by mouth 4 (four) times daily -  before meals and at bedtime. (Patient taking differently: Take 10 mg by mouth 3 (three) times daily before meals. ) 10 capsule 0  . dorzolamide-timolol (COSOPT) 22.3-6.8 MG/ML ophthalmic solution Place 1 drop into both eyes 2 (two) times daily.  98  . ferrous sulfate 325 (65 FE) MG tablet Take 325 mg by mouth daily with breakfast.    . gabapentin (NEURONTIN) 600 MG tablet Take 600 mg by mouth 2 (two) times daily.     .Marland KitchenguaiFENesin (MUCINEX) 600 MG 12 hr tablet Take 1 tablet (600 mg total) by mouth 2 (two) times daily. 10 tablet 0  . HUMALOG KWIKPEN 100 UNIT/ML KiwkPen Inject 4 Units into the skin 3 (three) times daily.  4  . Multiple Vitamin (MULTIVITAMIN WITH MINERALS) TABS tablet Take 1 tablet by mouth daily. 30 tablet 0  . saccharomyces boulardii (FLORASTOR) 250 MG capsule Take 1 capsule (250 mg total) by mouth 2 (two) times daily.     .Marland Kitchentorsemide (DEMADEX) 20 MG tablet TAKE 1 TABLET (20 MG TOTAL) BY MOUTH 2 (TWO) TIMES DAILY. 60 tablet 5  . traZODone (DESYREL) 50 MG tablet Take 100 mg by mouth at bedtime.     . TRULICITY 05.02MDX/4.1OISOPN Inject 0.75 mg into the skin once a week.    . metoprolol succinate (TOPROL-XL) 100 MG 24 hr tablet TAKE 1 TABLET BY MOUTH EVERY DAY IMMEDIATELY FOLLOWING A MEAL 30 tablet 5  . albuterol (PROVENTIL HFA;VENTOLIN HFA) 108 (90 Base) MCG/ACT inhaler Inhale 2 puffs into the lungs every 6 (six) hours as needed for wheezing or shortness of breath. (Patient not taking: Reported on 12/23/2019) 1 Inhaler 2  . Ensure Max Protein (ENSURE MAX PROTEIN) LIQD Take 330 mLs (11 oz total) by mouth 2 (two) times daily. (Patient not taking: Reported on 12/23/2019) 330 mL 0  . HYDROcodone-acetaminophen (NORCO) 10-325 MG tablet Take 1 tablet by mouth 2 (two) times a day.     . methocarbamol (ROBAXIN) 750 MG tablet Take 1 tablet (750 mg total) by mouth every 6 (six) hours as needed for muscle spasms. (Patient not taking: Reported on 12/23/2019) 30 tablet 0  . nitroGLYCERIN (NITROSTAT) 0.4 MG SL tablet PLACE 1 TABLET (0.4 MG TOTAL) UNDER THE TONGUE EVERY 5 (FIVE) MINUTES AS NEEDED FOR CHEST PAIN. (Patient not taking: Reported  on 12/23/2019) 25 tablet 3  . Insulin Detemir (LEVEMIR FLEXTOUCH) 100 UNIT/ML Pen Inject 20 Units into the skin daily. (Patient taking differently: Inject 30 Units into the skin daily. )    . mupirocin ointment (BACTROBAN) 2 % Apply 1 application topically daily. 22 g 0   No facility-administered medications prior to visit.   Meds ordered this encounter  Medications  . DISCONTD: labetalol (NORMODYNE) 200 MG tablet    Sig: Take 1 tablet (200 mg total) by mouth 2 (two) times daily.    Dispense:  60 tablet    Refill:  3    Discontinue Metoprolol  . labetalol (NORMODYNE) 200 MG tablet    Sig: Take 1 tablet (200 mg total) by mouth 2 (two) times daily.    Dispense:  60 tablet    Refill:  3     Discontinue Metoprolol   Medications Discontinued During This Encounter  Medication Reason  . Insulin Detemir (LEVEMIR FLEXTOUCH) 100 UNIT/ML Pen Change in therapy  . mupirocin ointment (BACTROBAN) 2 % Completed Course  . metoprolol succinate (TOPROL-XL) 100 MG 24 hr tablet Change in therapy  . labetalol (NORMODYNE) 200 MG tablet       Cardiovascular studies:  EKG 08/26/2019: Sinus rhythm 64 bpm. First degree A-V block   Carotid artery duplex 08/16/2019: Minimal stenosis in the right internal carotid artery (1-15%). Stenosis in the right external carotid artery (<50%). Senosis in the left internal carotid artery (50-69%). Stenosis in the left external carotid artery (>50%). Antegrade right vertebral artery flow. Antegrade left vertebral artery flow. Follow up in six months is appropriate if clinically indicated. Compared to 02/01/2019, right ICA stenosis of 15-49% not seen and no change in left ICA stenosis.  Hospital Echocardiogram 07/19/2018:  1. The left ventricle has low normal systolic function, with an ejection fraction of 50-55%. There is concentric left ventricular hypertrophy. No evidence of left ventricular regional wall motion abnormalities. Diastolic function not assessed.  2. The right ventricle has normal systolc function. There is no increase in right ventricular wall thickness.  3. Mild tricuspid regurgitation. This is a limited study and inadequate to assess RV systolic pressure.  4. Unlike previous study in 12/2017, pulmonary hypertension and diastolic dysfunction not adequately evaluated in this limited study. No other significant change noted.  Coronary intervention 04/06/2017: Successful complex PTCA and overlapping stents mid LAD Complexity due to severe calcification and severe tortuosity Resolute 3.0 X 18 mm Synergy 3.0 X 16 mm Residual 10% unde expansion of proximal stent  dLAD 2.75 X 26 mm Resolure DES  Recent labs: 08/26/2019: Glucose 421, BUN/Cr  32/2.34. EGFR 27. Na/K 135/5.1. Rest of the CMP normal  10/12/2018: Glucose 247, BUN/Cr 23/1.85. EGFR 41. Na/K 139/4.8. Rest of the CMP normal H/H 8.0/23.6. MCV 108. Platelets 206  Lab 10/04/2019:  Serum glucose 68 mg, BUN 29, creatinine 2.17, EGFR 36 mL, potassium 4.4.  CMP otherwise normal.  A1c 10.8%.  Total cholesterol 143, triglycerides 152, HDL 40, LDL 76.   Review of Systems  Cardiovascular: Positive for leg swelling (Stable and improved). Negative for chest pain, dyspnea on exertion, palpitations and syncope.  Neurological: Positive for paresthesias.   Vitals with BMI 12/23/2019 08/26/2019 08/26/2019  Height _0  - _1   Weight 245 lbs - 228 lbs  BMI 74.16 - 38.45  Systolic 364 680 321  Diastolic 67 80 75  Pulse 55 66 66      Objective:    Physical Exam Vitals and nursing note reviewed.  Constitutional:  General: He is not in acute distress. Neck:     Vascular: No JVD.  Cardiovascular:     Rate and Rhythm: Normal rate and regular rhythm.     Pulses: Intact distal pulses.          Carotid pulses are on the right side with bruit and on the left side with bruit.    Comments: Bilateral trace leg edema and 1-2+ ankle edema, pitting.  No JVD. Pulmonary:     Effort: Pulmonary effort is normal.     Breath sounds: Normal breath sounds. No wheezing or rales.  Abdominal:     General: Bowel sounds are normal.     Palpations: Abdomen is soft.     Tenderness: There is no rebound.  Neurological:     Cranial Nerves: No cranial nerve deficit.    Assessment & Recommendations:   74 year old African-American male with hypertension, type 2 DM, CKD 3, CAD s/p complex LAD PCI 03/2017, mod carotid stenosis, HFpEF, WHO grp II pulmonary hypertension.  HFpEF: Stable, euvolumic. Continue current management.  CAD: Stable. No current angina.  Continue current management. .  Pulmonary hypertension: WHO Grp II, possibly out of proportion. Continue managmenet with diuretics at  this time.  Hypertension: Uncontrolled.  He has not been able to take BiDil 3 times a day as he wakes up very late in the morning and sleeps fairly early.  I am skeptical of increasing the dose of amlodipine due to prior history of leg edema, as he has underlying sinus bradycardia and first-degree AV block, will discontinue metoprolol and switch him to labetalol.  Moderate carotid stenoses: Asymptomatic. Continue risk factor modification.  Continue surveillance.  Anemia: Stable. Likely due to CKD. Continue iron supplementation. Follow up with PCP.  Hyperlipidemia: I initially ordered labs however realized that we have faxed reports from PCP, lipids are under good control, would like to get his LDL to <70, I have discussed with him regarding weight loss and diet.  Also his diabetes continues to be very much uncontrolled with marked elevation in A1c.  He will f/u in 6 weeks for hypertension and bradycardia with repeat EKG.     ICD-10-CM   1. Coronary artery disease involving native coronary artery of native heart without angina pectoris  I25.10 CANCELED: Lipid Panel With LDL/HDL Ratio  2. Chronic heart failure with preserved ejection fraction (HFpEF) (HCC)  I50.32   3. Stage 3b chronic kidney disease  N18.32   4. Mixed hyperlipidemia  E78.2   5. Type 2 diabetes mellitus with stage 3b chronic kidney disease, with long-term current use of insulin (HCC)  E11.21 CANCELED: TSH   N18.32 CANCELED: Hgb A1c w/o eAG   Z79.4   6. Essential hypertension  I10 labetalol (NORMODYNE) 200 MG tablet    DISCONTINUED: labetalol (NORMODYNE) 200 MG tablet    2nd labetalol order sent to [facility].    Adrian Prows, MD, [facility] 12/23/2019, 4:35 PM Office: [phone]

## 2019-12-28 ENCOUNTER — Telehealth: Payer: Self-pay

## 2019-12-29 NOTE — Telephone Encounter (Signed)
Can try stopping labetolol and go back to metoprolol xL 100 mg daily.  Thanks MJP

## 2019-12-30 NOTE — Telephone Encounter (Signed)
Left vm to cb.

## 2020-01-02 ENCOUNTER — Telehealth: Payer: Self-pay

## 2020-01-02 NOTE — Telephone Encounter (Signed)
Tried calling. No answer. Left voicemail. I could see him sooner than 9/17 if he would prefer.  Thanks MJP

## 2020-01-02 NOTE — Telephone Encounter (Signed)
Patient called and advised that he is having a side effect from the medication Labetalol says 12 hrs after starting med he is having ringing in his ears , headaches , " all over discomfort " . Patient also says he has went back taking metoprolol . He wants a different medication and requesting a call back . Thanks

## 2020-01-03 ENCOUNTER — Telehealth: Payer: Self-pay

## 2020-01-03 NOTE — Telephone Encounter (Signed)
Patient called stating that since you put him on Labetalol, he has been having a strong loud ringing in his ears and is refusing to take it now. He is asking that you contact him through MyChart or give him a call, on what he should do at this point.

## 2020-01-03 NOTE — Telephone Encounter (Signed)
I have replied before to this message. Recommend stopping labetalol, and going back to metoprolol at the previous dose. I tried calling the patient on 8/16, 8/17 with no response.   Staff, please arrange office visit. Will need to recheck blood pressures on metoprolol.  Thanks MJP

## 2020-01-03 NOTE — Telephone Encounter (Signed)
Please call patient to schedule office visit.

## 2020-01-04 ENCOUNTER — Other Ambulatory Visit: Payer: Self-pay | Admitting: Cardiology

## 2020-01-04 ENCOUNTER — Telehealth: Payer: Self-pay

## 2020-01-04 NOTE — Telephone Encounter (Signed)
Vml for pt to call back   -[place]

## 2020-01-05 NOTE — Telephone Encounter (Signed)
I tried calling patient several times, NA, LMAM to callback about scheduling appt.

## 2020-01-13 NOTE — Telephone Encounter (Signed)
From pt

## 2020-01-13 NOTE — Telephone Encounter (Signed)
Thank you for letting me know. I will try to see if I can see him sooner than 9/17.    Staff, can you please check?  Thanks MJP

## 2020-01-13 NOTE — Telephone Encounter (Signed)
Could you please look into this for me? Thank you!

## 2020-01-14 ENCOUNTER — Other Ambulatory Visit: Payer: Self-pay | Admitting: Cardiology

## 2020-01-14 DIAGNOSIS — I5032 Chronic diastolic (congestive) heart failure: Secondary | ICD-10-CM

## 2020-01-17 ENCOUNTER — Other Ambulatory Visit: Payer: Self-pay

## 2020-01-17 DIAGNOSIS — I5032 Chronic diastolic (congestive) heart failure: Secondary | ICD-10-CM

## 2020-01-17 MED ORDER — TORSEMIDE 20 MG PO TABS: 20.0000 mg | ORAL_TABLET | Freq: Two times a day (BID) | ORAL | 5 refills | Status: DC

## 2020-01-20 ENCOUNTER — Ambulatory Visit: Payer: Medicare Other | Admitting: Cardiology

## 2020-01-24 NOTE — Telephone Encounter (Signed)
Send metoprolol? Okay to send.

## 2020-01-25 ENCOUNTER — Other Ambulatory Visit: Payer: Self-pay

## 2020-01-25 DIAGNOSIS — I5032 Chronic diastolic (congestive) heart failure: Secondary | ICD-10-CM

## 2020-01-25 MED ORDER — TORSEMIDE 20 MG PO TABS: 20.0000 mg | ORAL_TABLET | Freq: Two times a day (BID) | ORAL | 3 refills | Status: DC

## 2020-01-25 MED ORDER — METOPROLOL SUCCINATE ER 100 MG PO TB24: 100.0000 mg | ORAL_TABLET | Freq: Every day | ORAL | 3 refills | Status: DC

## 2020-02-02 ENCOUNTER — Other Ambulatory Visit: Payer: Self-pay

## 2020-02-02 DIAGNOSIS — I5032 Chronic diastolic (congestive) heart failure: Secondary | ICD-10-CM

## 2020-02-02 MED ORDER — METOPROLOL SUCCINATE ER 100 MG PO TB24: 100.0000 mg | ORAL_TABLET | Freq: Every day | ORAL | 3 refills | Status: DC

## 2020-02-02 MED ORDER — TORSEMIDE 20 MG PO TABS: 20.0000 mg | ORAL_TABLET | Freq: Two times a day (BID) | ORAL | 3 refills | Status: DC

## 2020-02-03 ENCOUNTER — Ambulatory Visit: Payer: Medicare Other | Admitting: Cardiology

## 2020-02-24 ENCOUNTER — Other Ambulatory Visit: Payer: Self-pay

## 2020-02-24 MED ORDER — METOPROLOL SUCCINATE ER 100 MG PO TB24: 100.0000 mg | ORAL_TABLET | Freq: Every day | ORAL | 3 refills | Status: DC

## 2020-02-24 NOTE — Telephone Encounter (Signed)
From patient.

## 2020-02-24 NOTE — Telephone Encounter (Signed)
From pt

## 2020-02-24 NOTE — Telephone Encounter (Signed)
Please send the prescriptions

## 2020-03-06 ENCOUNTER — Ambulatory Visit: Payer: Medicare Other | Admitting: Family

## 2020-03-06 ENCOUNTER — Encounter: Payer: Self-pay | Admitting: Family

## 2020-03-06 DIAGNOSIS — B351 Tinea unguium: Secondary | ICD-10-CM

## 2020-03-06 DIAGNOSIS — E1142 Type 2 diabetes mellitus with diabetic polyneuropathy: Secondary | ICD-10-CM

## 2020-03-06 DIAGNOSIS — L97511 Non-pressure chronic ulcer of other part of right foot limited to breakdown of skin: Secondary | ICD-10-CM

## 2020-03-06 DIAGNOSIS — M6701 Short Achilles tendon (acquired), right ankle: Secondary | ICD-10-CM

## 2020-03-06 NOTE — Progress Notes (Signed)
Office Visit Note   Patient: David [name]           Date of Birth: Oct 06, 1945           MRN: [id number] Visit Date: 03/06/2020              Requested by: [name], MD [address] [place],  [place] [address] PCP: [name], MD  No chief complaint on file.     HPI: The patient is a 74 year old gentleman seen today in follow-up, concerned for worsening of right foot ulcer.  It has been many months since his last visit.   Worried for increased clear drainage from wound, soaking bandaid daily.   Wound beneath his first metatarsal head on the right. Has been wearing tommy copper compression hose with direct skin contact, bilaterally. In regular shoewear.  Assessment & Plan: Visit Diagnoses:  No diagnosis found.  Plan: He will continue with his compression garments daily.  Mupirocin dressings to right foot ulcer. Encouraged use of custom orthotics and pressure relieving padding. Minimize weight bearing and pressure to wound area. He will follow-up in 4 weeks or sooner should he have difficulty in the interim.  Follow-Up Instructions: Return in about 2 months (around 05/06/2020).   Ortho Exam  Patient is alert, oriented, no adenopathy, well-dressed, normal affect, normal respiratory effort.  On examination of the right lower extremity he continues with ulceration beneath the first metatarsal head this is 1 cm in diameter.  This was debrided with a 10 blade knife back to viable tissue.  There is open central ulcer of 1 cm in diameter with no depth.  There is some surrounding epiboly there is no active drainage no surrounding erythema no sign of infection.  The great toenail of left foot is quite thickened and discolored onychomycotic.  Onychomycotic nails were trimmed today bilaterally.  Patient is unable to safely trim his nails.    Imaging: No results found.   Labs: Lab Results  Component Value Date   HGBA1C 7.1 (H) 05/28/2018   HGBA1C 6.3 (H)  12/19/2017   HGBA1C 7.5 (H) 11/24/2017   ESRSEDRATE 73 (H) 02/07/2015   ESRSEDRATE 22 (H) 09/15/2013   CRP 8.2 (H) 02/07/2015   LABURIC 4.4 08/12/2018   REPTSTATUS 08/16/2018 FINAL 08/15/2018   GRAMSTAIN  08/07/2018    MODERATE WBC PRESENT, PREDOMINANTLY PMN NO ORGANISMS SEEN    CULT MULTIPLE SPECIES PRESENT, SUGGEST RECOLLECTION (A) 08/15/2018     Lab Results  Component Value Date   ALBUMIN 3.7 09/10/2018   ALBUMIN 2.5 (L) 08/15/2018   ALBUMIN 2.4 (L) 08/14/2018   LABURIC 4.4 08/12/2018    There is no height or weight on file to calculate BMI.  Orders:  No orders of the defined types were placed in this encounter.  No orders of the defined types were placed in this encounter.    Procedures: No procedures performed  Clinical Data: No additional findings.  ROS:  All other systems negative, except as noted in the HPI. Review of Systems  Constitutional: Negative for chills and fever.  Cardiovascular: Negative for leg swelling.  Skin: Positive for wound. Negative for color change.    Objective: Vital Signs: There were no vitals taken for this visit.  Specialty Comments:  No specialty comments available.  PMFS History: Patient Active Problem List   Diagnosis Date Noted  . Mixed hyperlipidemia 10/15/2018  . Bilateral leg edema 09/17/2018  . Stage 3b chronic kidney disease ([place]) 09/10/2018  .  Anemia 09/10/2018  . Pain and swelling of wrist, right 08/14/2018  . Bilateral carotid artery stenosis 07/30/2018  . Coronary artery disease involving native coronary artery of native heart without angina pectoris 07/30/2018  . Snoring 02/11/2018  . Chronic heart failure with preserved ejection fraction (HFpEF) ([place]) 12/22/2017  . At risk for adverse drug reaction 12/15/2017  . Chronic pain syndrome 12/15/2017  . Status post coronary artery stent placement   . Multifocal pneumonia 03/30/2017  . Insulin-requiring or dependent type II diabetes mellitus ([place]) 03/29/2017   . Acute on chronic respiratory failure with hypoxia ([place])   . Pulmonary congestion   . Acquired contracture of Achilles tendon, right 08/19/2016  . Amputated great toe, right ([place]) 07/18/2016  . Onychomycosis 05/29/2016  . Diabetic polyneuropathy associated with type 2 diabetes mellitus ([place]) 04/09/2016  . Right foot ulcer, limited to breakdown of skin ([place]) 04/09/2016  . Unspecified hereditary and idiopathic peripheral neuropathy 09/15/2013  . Malaise 08/14/2013  . Essential hypertension 08/14/2013  . Chronic back pain 08/14/2013  . Glaucoma 08/14/2013  . Headache 08/14/2013   Past Medical History:  Diagnosis Date  . Arthritis   . CAD in native artery    s/p stent in 11/18  . CHF (congestive heart failure) ([place])    normal echo in 11/18  . CKD (chronic kidney disease)   . DDD (degenerative disc disease), cervical   . Diabetes mellitus with complication (HCC)    Type II  . Diabetic neuropathy ([place])   . Diabetic retinopathy ([place])   . GERD (gastroesophageal reflux disease)   . Glaucoma   . Hypertension     Family History  Problem Relation Age of Onset  . Diabetes Other   . Hyperlipidemia Other   . Hypertension Other   . Stroke Other   . Alzheimer's disease Other   . Thyroid disease Mother   . Diabetes Mellitus II Father   . Alzheimer's disease Father     Past Surgical History:  Procedure Laterality Date  . AMPUTATION Right 07/09/2016   Procedure: Right Great Toe Amputation at Metatarsophalangeal Joint;  Surgeon: [name], MD;  Location: [facility];  Service: Orthopedics;  Laterality: Right;  . AMPUTATION Right 05/28/2018   Procedure: RIGHT SECOND TOE AMPUTATION;  Surgeon: [name], MD;  Location: [facility];  Service: Orthopedics;  Laterality: Right;  . BACK SURGERY     4  . CARDIAC CATHETERIZATION    . CORONARY STENT INTERVENTION N/A 04/06/2017   Procedure: CORONARY STENT INTERVENTION;  Surgeon: Nigel Mormon, MD;  Location: [facility];  Service:  Cardiovascular;  Laterality: N/A;  . CORONARY STENT INTERVENTION N/A 04/07/2017   Procedure: CORONARY STENT INTERVENTION;  Surgeon: Nigel Mormon, MD;  Location: [facility];  Service: Cardiovascular;  Laterality: N/A;  . CYST EXCISION     on Back  . ENDOTRACHEAL INTUBATION EMERGENT  08/07/2018      . JOINT REPLACEMENT    . LAPAROSCOPIC ABDOMINAL EXPLORATION N/A 11/26/2017   Procedure: LAPAROSCOPIC ABDOMINAL EXPLORATION, DRAINAGE OF APPENDICEAL ABCESS. PLACEMENT OF DRAIN;  Surgeon: Alphonsa Overall, MD;  Location: [facility];  Service: General;  Laterality: N/A;  . RIGHT/LEFT HEART CATH AND CORONARY ANGIOGRAPHY N/A 04/06/2017   Procedure: RIGHT/LEFT HEART CATH AND CORONARY ANGIOGRAPHY;  Surgeon: Nigel Mormon, MD;  Location: [facility];  Service: Cardiovascular;  Laterality: N/A;  . TOE SURGERY  05/2018   Left   . TOTAL HIP ARTHROPLASTY     Right  Social History   Occupational History  . Occupation: Retired  Tobacco Use  . Smoking status: Former Smoker    Packs/day: 0.25    Years: 5.00    Pack years: 1.25    Types: Cigarettes    Quit date: 1996    Years since quitting: 25.8  . Smokeless tobacco: Never Used  Vaping Use  . Vaping Use: Never used  Substance and Sexual Activity  . Alcohol use: Yes    Comment: occ  . Drug use: No  . Sexual activity: Yes

## 2020-03-20 ENCOUNTER — Other Ambulatory Visit: Payer: Self-pay | Admitting: Cardiology

## 2020-03-20 DIAGNOSIS — I5030 Unspecified diastolic (congestive) heart failure: Secondary | ICD-10-CM

## 2020-04-17 ENCOUNTER — Ambulatory Visit: Payer: Medicare Other | Admitting: Family

## 2020-04-17 ENCOUNTER — Other Ambulatory Visit: Payer: Self-pay | Admitting: Cardiology

## 2020-04-18 ENCOUNTER — Ambulatory Visit: Payer: Medicare Other | Admitting: Family

## 2020-04-24 ENCOUNTER — Encounter: Payer: Self-pay | Admitting: Physician Assistant

## 2020-04-24 ENCOUNTER — Ambulatory Visit: Payer: Medicare Other | Admitting: Physician Assistant

## 2020-04-24 DIAGNOSIS — M6701 Short Achilles tendon (acquired), right ankle: Secondary | ICD-10-CM | POA: Diagnosis not present

## 2020-04-24 NOTE — Progress Notes (Signed)
Office Visit Note   Patient: David [name]           Date of Birth: [birth year]           MRN: [id number] Visit Date: 04/24/2020              Requested by: [name], MD [address] [place],  [place] [address] PCP: [name], MD  Chief Complaint  Patient presents with  . Right Foot - Follow-up      HPI: Patient presents in follow-up today.  He was recently seen for a blister on his left lower extremity by urgent care.  He was told to place of Xeroform dressing and since then it has gotten much better.  He still has some swelling in his bilateral lower extremities and he admits he is not always taking his diuretics as prescribed.  He is also asking to have his nails trimmed today.  Would also like help putting on his compression stockings  Assessment & Plan: Visit Diagnoses: No diagnosis found.  Plan: Emphasized the importance of working with his physician on management with the diuretics.  Overall his legs look good with no new ulcers and the one area of concern has resolved.  Bilateral vive stockings were placed.  Follow-up in 1 month or sooner if they are concerned about anything onychomycotic nails were trimmed x6 also trimmed ulcer beneath first MTP to healthy soft surfaces emphasized the importance of Achilles stretching to avoid forefoot overload and recurrence and deepening of his ulcer  Follow-Up Instructions: No follow-ups on file.   Ortho Exam  Patient is alert, oriented, no adenopathy, well-dressed, normal affect, normal respiratory effort.  Left lower extremity resolved blister.  He does have venous stasis disease but no open ulcers at this time.  Distal pulses are intact.  On the right foot he is status post great toe amputation he does have a thickened callus and grade 1 ulcer on the underside of his first metatarsal.  This was debrided to a stable rim.  He does have noted Achilles contracture.  Neither leg or feet show any signs of ascending  cellulitis  Imaging: No results found. No images are attached to the encounter.  Labs: Lab Results  Component Value Date   HGBA1C 7.1 (H) 05/28/2018   HGBA1C 6.3 (H) 12/19/2017   HGBA1C 7.5 (H) 11/24/2017   ESRSEDRATE 73 (H) 02/07/2015   ESRSEDRATE 22 (H) 09/15/2013   CRP 8.2 (H) 02/07/2015   LABURIC 4.4 08/12/2018   REPTSTATUS 08/16/2018 FINAL 08/15/2018   GRAMSTAIN  08/07/2018    MODERATE WBC PRESENT, PREDOMINANTLY PMN NO ORGANISMS SEEN    CULT MULTIPLE SPECIES PRESENT, SUGGEST RECOLLECTION (A) 08/15/2018     Lab Results  Component Value Date   ALBUMIN 3.7 09/10/2018   ALBUMIN 2.5 (L) 08/15/2018   ALBUMIN 2.4 (L) 08/14/2018   LABURIC 4.4 08/12/2018    Lab Results  Component Value Date   MG 1.9 08/15/2018   MG 1.8 08/14/2018   MG 1.7 08/11/2018   No results found for: VD25OH  No results found for: PREALBUMIN CBC EXTENDED Latest Ref Rng & Units 09/10/2018 08/15/2018 08/14/2018  WBC 3.4 - 10.8 x10E3/uL 6.3 19.5(H) 17.0(H)  RBC 4.14 - 5.80 x10E6/uL 2.18(LL) 2.35(L) 2.30(L)  HGB 13.0 - 17.7 g/dL 8.0(L) 7.8(L) 7.6(L)  HCT 37.5 - 51.0 % 23.6(L) 25.2(L) 24.5(L)  PLT 150 - 450 x10E3/uL 206 315 304  NEUTROABS 1.7 - 7.7 K/uL - 18.0(H) 12.9(H)  LYMPHSABS 0.7 -  4.0 K/uL - 0.9 2.3     There is no height or weight on file to calculate BMI.  Orders:  No orders of the defined types were placed in this encounter.  No orders of the defined types were placed in this encounter.    Procedures: No procedures performed  Clinical Data: No additional findings.  ROS:  All other systems negative, except as noted in the HPI. Review of Systems  Objective: Vital Signs: There were no vitals taken for this visit.  Specialty Comments:  No specialty comments available.  PMFS History: Patient Active Problem List   Diagnosis Date Noted  . Mixed hyperlipidemia 10/15/2018  . Bilateral leg edema 09/17/2018  . Stage 3b chronic kidney disease ([place]) 09/10/2018  . Anemia  09/10/2018  . Pain and swelling of wrist, right 08/14/2018  . Bilateral carotid artery stenosis 07/30/2018  . Coronary artery disease involving native coronary artery of native heart without angina pectoris 07/30/2018  . Snoring 02/11/2018  . Chronic heart failure with preserved ejection fraction (HFpEF) ([place]) 12/22/2017  . At risk for adverse drug reaction 12/15/2017  . Chronic pain syndrome 12/15/2017  . Status post coronary artery stent placement   . Multifocal pneumonia 03/30/2017  . Insulin-requiring or dependent type II diabetes mellitus ([place]) 03/29/2017  . Acute on chronic respiratory failure with hypoxia ([place])   . Pulmonary congestion   . Acquired contracture of Achilles tendon, right 08/19/2016  . Amputated great toe, right ([place]) 07/18/2016  . Onychomycosis 05/29/2016  . Diabetic polyneuropathy associated with type 2 diabetes mellitus ([place]) 04/09/2016  . Right foot ulcer, limited to breakdown of skin ([place]) 04/09/2016  . Unspecified hereditary and idiopathic peripheral neuropathy 09/15/2013  . Malaise 08/14/2013  . Essential hypertension 08/14/2013  . Chronic back pain 08/14/2013  . Glaucoma 08/14/2013  . Headache 08/14/2013   Past Medical History:  Diagnosis Date  . Arthritis   . CAD in native artery    s/p stent in 11/18  . CHF (congestive heart failure) ([place])    normal echo in 11/18  . CKD (chronic kidney disease)   . DDD (degenerative disc disease), cervical   . Diabetes mellitus with complication (HCC)    Type II  . Diabetic neuropathy ([place])   . Diabetic retinopathy ([place])   . GERD (gastroesophageal reflux disease)   . Glaucoma   . Hypertension     Family History  Problem Relation Age of Onset  . Diabetes Other   . Hyperlipidemia Other   . Hypertension Other   . Stroke Other   . Alzheimer's disease Other   . Thyroid disease Mother   . Diabetes Mellitus II Father   . Alzheimer's disease Father     Past Surgical History:  Procedure Laterality Date  .  AMPUTATION Right 07/09/2016   Procedure: Right Great Toe Amputation at Metatarsophalangeal Joint;  Surgeon: [name], MD;  Location: [facility];  Service: Orthopedics;  Laterality: Right;  . AMPUTATION Right 05/28/2018   Procedure: RIGHT SECOND TOE AMPUTATION;  Surgeon: [name], MD;  Location: [facility];  Service: Orthopedics;  Laterality: Right;  . BACK SURGERY     4  . CARDIAC CATHETERIZATION    . CORONARY STENT INTERVENTION N/A 04/06/2017   Procedure: CORONARY STENT INTERVENTION;  Surgeon: Nigel Mormon, MD;  Location: [facility];  Service: Cardiovascular;  Laterality: N/A;  . CORONARY STENT INTERVENTION N/A 04/07/2017   Procedure: CORONARY STENT INTERVENTION;  Surgeon: Nigel Mormon, MD;  Location: [facility]  INVASIVE [facility];  Service: Cardiovascular;  Laterality: N/A;  . CYST EXCISION     on Back  . ENDOTRACHEAL INTUBATION EMERGENT  08/07/2018      . JOINT REPLACEMENT    . LAPAROSCOPIC ABDOMINAL EXPLORATION N/A 11/26/2017   Procedure: LAPAROSCOPIC ABDOMINAL EXPLORATION, DRAINAGE OF APPENDICEAL ABCESS. PLACEMENT OF DRAIN;  Surgeon: Alphonsa Overall, MD;  Location: [facility];  Service: General;  Laterality: N/A;  . RIGHT/LEFT HEART CATH AND CORONARY ANGIOGRAPHY N/A 04/06/2017   Procedure: RIGHT/LEFT HEART CATH AND CORONARY ANGIOGRAPHY;  Surgeon: Nigel Mormon, MD;  Location: [facility];  Service: Cardiovascular;  Laterality: N/A;  . TOE SURGERY  05/2018   Left   . TOTAL HIP ARTHROPLASTY     Right    Social History   Occupational History  . Occupation: Retired  Tobacco Use  . Smoking status: Former Smoker    Packs/day: 0.25    Years: 5.00    Pack years: 1.25    Types: Cigarettes    Quit date: 1996    Years since quitting: 25.9  . Smokeless tobacco: Never Used  Vaping Use  . Vaping Use: Never used  Substance and Sexual Activity  . Alcohol use: Yes    Comment: occ  . Drug use: No  . Sexual activity: Yes

## 2020-05-22 ENCOUNTER — Ambulatory Visit: Payer: Medicare Other | Admitting: Orthopedic Surgery

## 2020-07-18 ENCOUNTER — Ambulatory Visit (INDEPENDENT_AMBULATORY_CARE_PROVIDER_SITE_OTHER): Payer: Medicare Other | Admitting: Family

## 2020-07-18 ENCOUNTER — Encounter: Payer: Self-pay | Admitting: Family

## 2020-07-18 DIAGNOSIS — B351 Tinea unguium: Secondary | ICD-10-CM

## 2020-07-18 DIAGNOSIS — M6701 Short Achilles tendon (acquired), right ankle: Secondary | ICD-10-CM

## 2020-07-18 DIAGNOSIS — Z89421 Acquired absence of other right toe(s): Secondary | ICD-10-CM

## 2020-07-18 DIAGNOSIS — L97421 Non-pressure chronic ulcer of left heel and midfoot limited to breakdown of skin: Secondary | ICD-10-CM

## 2020-07-25 NOTE — Progress Notes (Signed)
Office Visit Note   Patient: David [name]           Date of Birth: 1946-01-27           MRN: [id number] Visit Date: 07/18/2020              Requested by: [name], MD [address] [place],  [place] [address] PCP: [name], MD  No chief complaint on file.     HPI: The patient is a 75 year old gentleman who presents today complaining of pain over the lateral aspect of his left foot.  He states that he had stopped taking his Lasix due to multiple episodes of incontinence and unfortunately his edema had gotten out of control.  He has been having increasing pain of the left foot.  He also would like a nail trim as well as a check of the calluses of his right foot  Assessment & Plan: Visit Diagnoses: No diagnosis found.  Plan: Unfortunately he has developed a new ulcer over the base of his fifth metatarsal on the left this was debrided back to viable tissue.  He will begin doing silver cell dressing changes of this ulcer.  Continue his compression garments around-the-clock except for with bathing.  Discussed the importance of taking his Lasix as prescribed.  Discussed return precautions.  Follow-Up Instructions: Return in about 2 weeks (around 08/01/2020).   Ortho Exam  Patient is alert, oriented, no adenopathy, well-dressed, normal affect, normal respiratory effort. On examination of the right lower extremity he does have good wrinkling of his skin some hemosiderin staining along the anterior shin.  Well-healed amputation.  There is callus to the right first metatarsal head this was debrided of nonviable tissue back to viable tissue.  There is no underlying ulcer no drainage no erythema no tenderness callus to the medial border of his great toe debrided as well no underlying ulceration or sign of infection.  On examination of the left lower extremity he again has good wrinkling of the skin trace edema with hemosiderin staining of the lower leg there is new open  ulceration over the lateral aspect extending to the plantar aspect of his foot along the base of the fifth metatarsal this is the size of a half dollar there is surrounding callus and nonviable tissue this was debrided with a 10 blade knife there is 100% granulation tissue with about 2 mm of depth of this ulcer no purulence no erythema no warmth does have a palpable dorsalis pedis pulse. Thickened and discolored onychomycotic nails. he is unable to safely trim the nails of his left foot these were trimmed today x5 without incident  Imaging: No results found. No images are attached to the encounter.  Labs: Lab Results  Component Value Date   HGBA1C 7.1 (H) 05/28/2018   HGBA1C 6.3 (H) 12/19/2017   HGBA1C 7.5 (H) 11/24/2017   ESRSEDRATE 73 (H) 02/07/2015   ESRSEDRATE 22 (H) 09/15/2013   CRP 8.2 (H) 02/07/2015   LABURIC 4.4 08/12/2018   REPTSTATUS 08/16/2018 FINAL 08/15/2018   GRAMSTAIN  08/07/2018    MODERATE WBC PRESENT, PREDOMINANTLY PMN NO ORGANISMS SEEN    CULT MULTIPLE SPECIES PRESENT, SUGGEST RECOLLECTION (A) 08/15/2018     Lab Results  Component Value Date   ALBUMIN 3.7 09/10/2018   ALBUMIN 2.5 (L) 08/15/2018   ALBUMIN 2.4 (L) 08/14/2018   LABURIC 4.4 08/12/2018    Lab Results  Component Value Date   MG 1.9 08/15/2018   MG 1.8  08/14/2018   MG 1.7 08/11/2018   No results found for: VD25OH  No results found for: PREALBUMIN CBC EXTENDED Latest Ref Rng & Units 09/10/2018 08/15/2018 08/14/2018  WBC 3.4 - 10.8 x10E3/uL 6.3 19.5(H) 17.0(H)  RBC 4.14 - 5.80 x10E6/uL 2.18(LL) 2.35(L) 2.30(L)  HGB 13.0 - 17.7 g/dL 8.0(L) 7.8(L) 7.6(L)  HCT 37.5 - 51.0 % 23.6(L) 25.2(L) 24.5(L)  PLT 150 - 450 x10E3/uL 206 315 304  NEUTROABS 1.7 - 7.7 K/uL - 18.0(H) 12.9(H)  LYMPHSABS 0.7 - 4.0 K/uL - 0.9 2.3     There is no height or weight on file to calculate BMI.  Orders:  No orders of the defined types were placed in this encounter.  No orders of the defined types were placed in  this encounter.    Procedures: No procedures performed  Clinical Data: No additional findings.  ROS:  All other systems negative, except as noted in the HPI. Review of Systems  Objective: Vital Signs: There were no vitals taken for this visit.  Specialty Comments:  No specialty comments available.  PMFS History: Patient Active Problem List   Diagnosis Date Noted  . Mixed hyperlipidemia 10/15/2018  . Bilateral leg edema 09/17/2018  . Stage 3b chronic kidney disease ([place]) 09/10/2018  . Anemia 09/10/2018  . Pain and swelling of wrist, right 08/14/2018  . Bilateral carotid artery stenosis 07/30/2018  . Coronary artery disease involving native coronary artery of native heart without angina pectoris 07/30/2018  . Snoring 02/11/2018  . Chronic heart failure with preserved ejection fraction (HFpEF) ([place]) 12/22/2017  . At risk for adverse drug reaction 12/15/2017  . Chronic pain syndrome 12/15/2017  . Status post coronary artery stent placement   . Multifocal pneumonia 03/30/2017  . Insulin-requiring or dependent type II diabetes mellitus ([place]) 03/29/2017  . Acute on chronic respiratory failure with hypoxia ([place])   . Pulmonary congestion   . Acquired contracture of Achilles tendon, right 08/19/2016  . Amputated great toe, right ([place]) 07/18/2016  . Onychomycosis 05/29/2016  . Diabetic polyneuropathy associated with type 2 diabetes mellitus ([place]) 04/09/2016  . Right foot ulcer, limited to breakdown of skin ([place]) 04/09/2016  . Unspecified hereditary and idiopathic peripheral neuropathy 09/15/2013  . Malaise 08/14/2013  . Essential hypertension 08/14/2013  . Chronic back pain 08/14/2013  . Glaucoma 08/14/2013  . Headache 08/14/2013   Past Medical History:  Diagnosis Date  . Arthritis   . CAD in native artery    s/p stent in 11/18  . CHF (congestive heart failure) ([place])    normal echo in 11/18  . CKD (chronic kidney disease)   . DDD (degenerative disc disease), cervical    . Diabetes mellitus with complication (HCC)    Type II  . Diabetic neuropathy ([place])   . Diabetic retinopathy ([place])   . GERD (gastroesophageal reflux disease)   . Glaucoma   . Hypertension     Family History  Problem Relation Age of Onset  . Diabetes Other   . Hyperlipidemia Other   . Hypertension Other   . Stroke Other   . Alzheimer's disease Other   . Thyroid disease Mother   . Diabetes Mellitus II Father   . Alzheimer's disease Father     Past Surgical History:  Procedure Laterality Date  . AMPUTATION Right 07/09/2016   Procedure: Right Great Toe Amputation at Metatarsophalangeal Joint;  Surgeon: [name], MD;  Location: [facility];  Service: Orthopedics;  Laterality: Right;  . AMPUTATION Right 05/28/2018   Procedure: RIGHT  SECOND TOE AMPUTATION;  Surgeon: [name], MD;  Location: [facility];  Service: Orthopedics;  Laterality: Right;  . BACK SURGERY     4  . CARDIAC CATHETERIZATION    . CORONARY STENT INTERVENTION N/A 04/06/2017   Procedure: CORONARY STENT INTERVENTION;  Surgeon: Nigel Mormon, MD;  Location: [facility];  Service: Cardiovascular;  Laterality: N/A;  . CORONARY STENT INTERVENTION N/A 04/07/2017   Procedure: CORONARY STENT INTERVENTION;  Surgeon: Nigel Mormon, MD;  Location: [facility];  Service: Cardiovascular;  Laterality: N/A;  . CYST EXCISION     on Back  . ENDOTRACHEAL INTUBATION EMERGENT  08/07/2018      . JOINT REPLACEMENT    . LAPAROSCOPIC ABDOMINAL EXPLORATION N/A 11/26/2017   Procedure: LAPAROSCOPIC ABDOMINAL EXPLORATION, DRAINAGE OF APPENDICEAL ABCESS. PLACEMENT OF DRAIN;  Surgeon: Alphonsa Overall, MD;  Location: [facility];  Service: General;  Laterality: N/A;  . RIGHT/LEFT HEART CATH AND CORONARY ANGIOGRAPHY N/A 04/06/2017   Procedure: RIGHT/LEFT HEART CATH AND CORONARY ANGIOGRAPHY;  Surgeon: Nigel Mormon, MD;  Location: [facility];  Service: Cardiovascular;  Laterality: N/A;  . TOE SURGERY  05/2018   Left    . TOTAL HIP ARTHROPLASTY     Right    Social History   Occupational History  . Occupation: Retired  Tobacco Use  . Smoking status: Former Smoker    Packs/day: 0.25    Years: 5.00    Pack years: 1.25    Types: Cigarettes    Quit date: 1996    Years since quitting: 26.2  . Smokeless tobacco: Never Used  Vaping Use  . Vaping Use: Never used  Substance and Sexual Activity  . Alcohol use: Yes    Comment: occ  . Drug use: No  . Sexual activity: Yes

## 2020-07-30 ENCOUNTER — Encounter: Payer: Self-pay | Admitting: Orthopedic Surgery

## 2020-07-31 ENCOUNTER — Ambulatory Visit: Payer: Self-pay

## 2020-07-31 ENCOUNTER — Encounter: Payer: Self-pay | Admitting: Orthopedic Surgery

## 2020-07-31 ENCOUNTER — Other Ambulatory Visit: Payer: Self-pay

## 2020-07-31 ENCOUNTER — Ambulatory Visit: Payer: Medicare Other | Admitting: Orthopedic Surgery

## 2020-07-31 DIAGNOSIS — E1142 Type 2 diabetes mellitus with diabetic polyneuropathy: Secondary | ICD-10-CM | POA: Diagnosis not present

## 2020-07-31 DIAGNOSIS — M6702 Short Achilles tendon (acquired), left ankle: Secondary | ICD-10-CM

## 2020-07-31 DIAGNOSIS — R600 Localized edema: Secondary | ICD-10-CM | POA: Diagnosis not present

## 2020-07-31 DIAGNOSIS — M86172 Other acute osteomyelitis, left ankle and foot: Secondary | ICD-10-CM

## 2020-07-31 DIAGNOSIS — M6701 Short Achilles tendon (acquired), right ankle: Secondary | ICD-10-CM | POA: Diagnosis not present

## 2020-07-31 DIAGNOSIS — M25552 Pain in left hip: Secondary | ICD-10-CM | POA: Diagnosis not present

## 2020-07-31 NOTE — Progress Notes (Signed)
Office Visit Note   Patient: David [name]           Date of Birth: 01/15/1946           MRN: [id number] Visit Date: 07/31/2020              Requested by: [name], MD [address] [place],  [place] [address] PCP: [name], MD  Chief Complaint  Patient presents with  . Left Foot - Follow-up  . Right Foot - Follow-up  . Left Hip - Pain      HPI: Patient is a 75 year old gentleman who is seen for multiple issues.  He complains of lateral left hip pain that is periodic not related to any specific activities.  He denies any groin pain.  Patient states he is currently on Demadex for his swelling but has not been taking this as scheduled.  Patient has been wearing his knee-high compression stockings for the venous swelling of both legs and he states the ulcers have healed on both legs.  He complains of callus beneath the first of the fifth metatarsal head of the right foot and complains of a painful ulcer beneath the fifth metatarsal head of the left foot.  Assessment & Plan: Visit Diagnoses:  1. Pain in left hip   2. Diabetic polyneuropathy associated with type 2 diabetes mellitus ([place])   3. Bilateral leg edema   4. Achilles tendon contracture, bilateral   5. Acute osteomyelitis of metatarsal bone, left (HCC)     Plan: We will need to proceed with 5th ray amputation of the left patient does live in a two-story house and most likely will be unable to navigate the stairs so we will admit him after the ray amputation and work with therapy for stair gait training.  Risk and benefits of surgery were discussed patient states he understands wishes to proceed with surgery on Friday  Follow-Up Instructions: No follow-ups on file.   Ortho Exam  Patient is alert, oriented, no adenopathy, well-dressed, normal affect, normal respiratory effort. Examination patient has brawny edema in both legs but the skin is wrinkling well and there are no open ulcers.  He has a  strong palpable dorsalis pedis pulse and posterior tibial pulse on the left.  Right foot he has callus beneath the first and fifth metatarsal head this was pared there are no deep ulcers no signs of infection.  He has equinus contracture of both heels with dorsiflexion about 10 degrees short of neutral.  Semination the left foot he has a chronic ulcer beneath the fifth metatarsal head there is hyper granulation tissue it is tender to palpation and there is exposed bone that is palpable at the base of the wound.  There is some mild ischemic changes at the base of the fifth metatarsal his main problem is osteomyelitis of the fifth metatarsal head left foot.  Imaging: XR HIP UNILAT W OR W/O PELVIS 2-3 VIEWS LEFT  Result Date: 07/31/2020 2 view radiographs of the left hip shows mild arthritic changes.  The visualized portion of the lumbar spine shows advanced degenerative arthritic changes with large osteophytic bone spurs.  No images are attached to the encounter.  Labs: Lab Results  Component Value Date   HGBA1C 7.1 (H) 05/28/2018   HGBA1C 6.3 (H) 12/19/2017   HGBA1C 7.5 (H) 11/24/2017   ESRSEDRATE 73 (H) 02/07/2015   ESRSEDRATE 22 (H) 09/15/2013   CRP 8.2 (H) 02/07/2015   LABURIC 4.4 08/12/2018  REPTSTATUS 08/16/2018 FINAL 08/15/2018   GRAMSTAIN  08/07/2018    MODERATE WBC PRESENT, PREDOMINANTLY PMN NO ORGANISMS SEEN    CULT MULTIPLE SPECIES PRESENT, SUGGEST RECOLLECTION (A) 08/15/2018     Lab Results  Component Value Date   ALBUMIN 3.7 09/10/2018   ALBUMIN 2.5 (L) 08/15/2018   ALBUMIN 2.4 (L) 08/14/2018   LABURIC 4.4 08/12/2018    Lab Results  Component Value Date   MG 1.9 08/15/2018   MG 1.8 08/14/2018   MG 1.7 08/11/2018   No results found for: VD25OH  No results found for: PREALBUMIN CBC EXTENDED Latest Ref Rng & Units 09/10/2018 08/15/2018 08/14/2018  WBC 3.4 - 10.8 x10E3/uL 6.3 19.5(H) 17.0(H)  RBC 4.14 - 5.80 x10E6/uL 2.18(LL) 2.35(L) 2.30(L)  HGB 13.0 - 17.7 g/dL  8.0(L) 7.8(L) 7.6(L)  HCT 37.5 - 51.0 % 23.6(L) 25.2(L) 24.5(L)  PLT 150 - 450 x10E3/uL 206 315 304  NEUTROABS 1.7 - 7.7 K/uL - 18.0(H) 12.9(H)  LYMPHSABS 0.7 - 4.0 K/uL - 0.9 2.3     There is no height or weight on file to calculate BMI.  Orders:  Orders Placed This Encounter  Procedures  . XR HIP UNILAT W OR W/O PELVIS 2-3 VIEWS LEFT   No orders of the defined types were placed in this encounter.    Procedures: No procedures performed  Clinical Data: No additional findings.  ROS:  All other systems negative, except as noted in the HPI. Review of Systems  Objective: Vital Signs: There were no vitals taken for this visit.  Specialty Comments:  No specialty comments available.  PMFS History: Patient Active Problem List   Diagnosis Date Noted  . Mixed hyperlipidemia 10/15/2018  . Bilateral leg edema 09/17/2018  . Stage 3b chronic kidney disease ([place]) 09/10/2018  . Anemia 09/10/2018  . Pain and swelling of wrist, right 08/14/2018  . Bilateral carotid artery stenosis 07/30/2018  . Coronary artery disease involving native coronary artery of native heart without angina pectoris 07/30/2018  . Snoring 02/11/2018  . Chronic heart failure with preserved ejection fraction (HFpEF) ([place]) 12/22/2017  . At risk for adverse drug reaction 12/15/2017  . Chronic pain syndrome 12/15/2017  . Status post coronary artery stent placement   . Multifocal pneumonia 03/30/2017  . Insulin-requiring or dependent type II diabetes mellitus ([place]) 03/29/2017  . Acute on chronic respiratory failure with hypoxia ([place])   . Pulmonary congestion   . Acquired contracture of Achilles tendon, right 08/19/2016  . Amputated great toe, right ([place]) 07/18/2016  . Onychomycosis 05/29/2016  . Diabetic polyneuropathy associated with type 2 diabetes mellitus ([place]) 04/09/2016  . Right foot ulcer, limited to breakdown of skin ([place]) 04/09/2016  . Unspecified hereditary and idiopathic peripheral neuropathy  09/15/2013  . Malaise 08/14/2013  . Essential hypertension 08/14/2013  . Chronic back pain 08/14/2013  . Glaucoma 08/14/2013  . Headache 08/14/2013   Past Medical History:  Diagnosis Date  . Arthritis   . CAD in native artery    s/p stent in 11/18  . CHF (congestive heart failure) ([place])    normal echo in 11/18  . CKD (chronic kidney disease)   . DDD (degenerative disc disease), cervical   . Diabetes mellitus with complication (HCC)    Type II  . Diabetic neuropathy ([place])   . Diabetic retinopathy ([place])   . GERD (gastroesophageal reflux disease)   . Glaucoma   . Hypertension     Family History  Problem Relation Age of Onset  . Diabetes Other   .  Hyperlipidemia Other   . Hypertension Other   . Stroke Other   . Alzheimer's disease Other   . Thyroid disease Mother   . Diabetes Mellitus II Father   . Alzheimer's disease Father     Past Surgical History:  Procedure Laterality Date  . AMPUTATION Right 07/09/2016   Procedure: Right Great Toe Amputation at Metatarsophalangeal Joint;  Surgeon: [name], MD;  Location: [facility];  Service: Orthopedics;  Laterality: Right;  . AMPUTATION Right 05/28/2018   Procedure: RIGHT SECOND TOE AMPUTATION;  Surgeon: [name], MD;  Location: [facility];  Service: Orthopedics;  Laterality: Right;  . BACK SURGERY     4  . CARDIAC CATHETERIZATION    . CORONARY STENT INTERVENTION N/A 04/06/2017   Procedure: CORONARY STENT INTERVENTION;  Surgeon: Nigel Mormon, MD;  Location: [facility];  Service: Cardiovascular;  Laterality: N/A;  . CORONARY STENT INTERVENTION N/A 04/07/2017   Procedure: CORONARY STENT INTERVENTION;  Surgeon: Nigel Mormon, MD;  Location: [facility];  Service: Cardiovascular;  Laterality: N/A;  . CYST EXCISION     on Back  . ENDOTRACHEAL INTUBATION EMERGENT  08/07/2018      . JOINT REPLACEMENT    . LAPAROSCOPIC ABDOMINAL EXPLORATION N/A 11/26/2017   Procedure: LAPAROSCOPIC ABDOMINAL EXPLORATION,  DRAINAGE OF APPENDICEAL ABCESS. PLACEMENT OF DRAIN;  Surgeon: Alphonsa Overall, MD;  Location: [facility];  Service: General;  Laterality: N/A;  . RIGHT/LEFT HEART CATH AND CORONARY ANGIOGRAPHY N/A 04/06/2017   Procedure: RIGHT/LEFT HEART CATH AND CORONARY ANGIOGRAPHY;  Surgeon: Nigel Mormon, MD;  Location: [facility];  Service: Cardiovascular;  Laterality: N/A;  . TOE SURGERY  05/2018   Left   . TOTAL HIP ARTHROPLASTY     Right    Social History   Occupational History  . Occupation: Retired  Tobacco Use  . Smoking status: Former Smoker    Packs/day: 0.25    Years: 5.00    Pack years: 1.25    Types: Cigarettes    Quit date: 1996    Years since quitting: 26.2  . Smokeless tobacco: Never Used  Vaping Use  . Vaping Use: Never used  Substance and Sexual Activity  . Alcohol use: Yes    Comment: occ  . Drug use: No  . Sexual activity: Yes

## 2020-08-01 ENCOUNTER — Other Ambulatory Visit: Payer: Self-pay

## 2020-08-01 ENCOUNTER — Other Ambulatory Visit: Payer: Self-pay | Admitting: Physician Assistant

## 2020-08-02 NOTE — Progress Notes (Signed)
Discussed with Dr. Therisa Doyne cardiac history.  MD aware.

## 2020-08-02 NOTE — Progress Notes (Addendum)
Unable to reach patient.  Left instructions on voicemial   EKG: 08/26/19 CXR: 08/15/18 ECHO: 08/08/18 Stress Test:  Cardiac Cath: 04/06/17  Fasting Blood Sugar-na Checks Blood Sugar__na_ times a day  OSA/CPAP": Unknown  ASA: Unknown last dose Blood Thinners: No  Anesthesia Review: Yes, CHF, CAD with cardiac stent  Patient denies shortness of breath, fever, cough, and chest pain at PAT appointment.  Patient verbalized understanding of instructions provided today at the PAT appointment.  Patient asked to review instructions at home and day of surgery.

## 2020-08-03 ENCOUNTER — Encounter (HOSPITAL_COMMUNITY): Payer: Self-pay | Admitting: Orthopedic Surgery

## 2020-08-03 ENCOUNTER — Other Ambulatory Visit: Payer: Self-pay

## 2020-08-03 ENCOUNTER — Ambulatory Visit (HOSPITAL_COMMUNITY): Payer: Medicare Other | Admitting: Anesthesiology

## 2020-08-03 ENCOUNTER — Observation Stay (HOSPITAL_COMMUNITY)
Admission: RE | Admit: 2020-08-03 | Discharge: 2020-08-04 | Disposition: A | Discharge status: Home / Self Care | Payer: Medicare Other | Attending: Orthopedic Surgery | Admitting: Orthopedic Surgery

## 2020-08-03 ENCOUNTER — Encounter (HOSPITAL_COMMUNITY)
Admission: RE | Disposition: A | Discharge status: Home / Self Care | Payer: Self-pay | Source: Home / Self Care | Attending: Orthopedic Surgery

## 2020-08-03 DIAGNOSIS — N189 Chronic kidney disease, unspecified: Secondary | ICD-10-CM | POA: Insufficient documentation

## 2020-08-03 DIAGNOSIS — Z20822 Contact with and (suspected) exposure to covid-19: Secondary | ICD-10-CM | POA: Diagnosis not present

## 2020-08-03 DIAGNOSIS — M869 Osteomyelitis, unspecified: Secondary | ICD-10-CM

## 2020-08-03 DIAGNOSIS — E1122 Type 2 diabetes mellitus with diabetic chronic kidney disease: Secondary | ICD-10-CM | POA: Diagnosis not present

## 2020-08-03 DIAGNOSIS — R600 Localized edema: Secondary | ICD-10-CM

## 2020-08-03 DIAGNOSIS — I509 Heart failure, unspecified: Secondary | ICD-10-CM | POA: Diagnosis not present

## 2020-08-03 DIAGNOSIS — I130 Hypertensive heart and chronic kidney disease with heart failure and stage 1 through stage 4 chronic kidney disease, or unspecified chronic kidney disease: Secondary | ICD-10-CM | POA: Insufficient documentation

## 2020-08-03 DIAGNOSIS — E1140 Type 2 diabetes mellitus with diabetic neuropathy, unspecified: Secondary | ICD-10-CM | POA: Insufficient documentation

## 2020-08-03 DIAGNOSIS — L02612 Cutaneous abscess of left foot: Secondary | ICD-10-CM | POA: Diagnosis not present

## 2020-08-03 DIAGNOSIS — M62462 Contracture of muscle, left lower leg: Secondary | ICD-10-CM | POA: Diagnosis not present

## 2020-08-03 DIAGNOSIS — Z87891 Personal history of nicotine dependence: Secondary | ICD-10-CM | POA: Diagnosis not present

## 2020-08-03 DIAGNOSIS — I2510 Atherosclerotic heart disease of native coronary artery without angina pectoris: Secondary | ICD-10-CM | POA: Insufficient documentation

## 2020-08-03 DIAGNOSIS — M86172 Other acute osteomyelitis, left ankle and foot: Principal | ICD-10-CM | POA: Insufficient documentation

## 2020-08-03 DIAGNOSIS — E11319 Type 2 diabetes mellitus with unspecified diabetic retinopathy without macular edema: Secondary | ICD-10-CM | POA: Insufficient documentation

## 2020-08-03 HISTORY — PX: AMPUTATION: SHX166

## 2020-08-03 LAB — GLUCOSE, CAPILLARY
Glucose-Capillary: 71 mg/dL (ref 70–99)
Glucose-Capillary: 81 mg/dL (ref 70–99)
Glucose-Capillary: 88 mg/dL (ref 70–99)
Glucose-Capillary: 174 mg/dL — ABNORMAL HIGH (ref 70–99)
Glucose-Capillary: 105 mg/dL — ABNORMAL HIGH (ref 70–99)

## 2020-08-03 LAB — CBC
HCT: 30.3 % — ABNORMAL LOW (ref 39.0–52.0)
Hemoglobin: 9.5 g/dL — ABNORMAL LOW (ref 13.0–17.0)
MCH: 35.2 pg — ABNORMAL HIGH (ref 26.0–34.0)
MCHC: 31.4 g/dL (ref 30.0–36.0)
MCV: 112.2 fL — ABNORMAL HIGH (ref 80.0–100.0)
Platelets: 232 10*3/uL (ref 150–400)
RBC: 2.7 MIL/uL — ABNORMAL LOW (ref 4.22–5.81)
RDW: 12.2 % (ref 11.5–15.5)
WBC: 8 10*3/uL (ref 4.0–10.5)
nRBC: 0 % (ref 0.0–0.2)

## 2020-08-03 LAB — BASIC METABOLIC PANEL
Anion gap: 9 (ref 5–15)
BUN: 42 mg/dL — ABNORMAL HIGH (ref 8–23)
CO2: 22 mmol/L (ref 22–32)
Calcium: 8.8 mg/dL — ABNORMAL LOW (ref 8.9–10.3)
Chloride: 106 mmol/L (ref 98–111)
Creatinine, Ser: 1.98 mg/dL — ABNORMAL HIGH (ref 0.61–1.24)
GFR, Estimated: 35 mL/min — ABNORMAL LOW (ref >60)
Glucose, Bld: 71 mg/dL (ref 70–99)
Potassium: 3.9 mmol/L (ref 3.5–5.1)
Sodium: 137 mmol/L (ref 135–145)

## 2020-08-03 LAB — HEMOGLOBIN A1C
Hgb A1c MFr Bld: 6.2 % — ABNORMAL HIGH (ref 4.8–5.6)
Mean Plasma Glucose: 131.24 mg/dL

## 2020-08-03 LAB — SARS CORONAVIRUS 2 BY RT PCR (HOSPITAL ORDER, PERFORMED IN ~~LOC~~ HOSPITAL LAB): SARS Coronavirus 2: NEGATIVE

## 2020-08-03 SURGERY — AMPUTATION, FOOT, RAY
Anesthesia: Monitor Anesthesia Care | Site: Foot | Laterality: Left

## 2020-08-03 MED ORDER — TRAZODONE HCL 100 MG PO TABS
100.0000 mg | ORAL_TABLET | Freq: Every day | ORAL | Status: DC
  Administered 2020-08-03: 100 mg via ORAL
  Filled 2020-08-03: qty 1

## 2020-08-03 MED ORDER — LACTATED RINGERS IV SOLN: INTRAVENOUS | Status: DC

## 2020-08-03 MED ORDER — METOCLOPRAMIDE HCL 5 MG/ML IJ SOLN: 5.0000 mg | Freq: Three times a day (TID) | INTRAMUSCULAR | Status: DC | PRN

## 2020-08-03 MED ORDER — AMISULPRIDE (ANTIEMETIC) 5 MG/2ML IV SOLN: 10.0000 mg | Freq: Once | INTRAVENOUS | Status: DC | PRN

## 2020-08-03 MED ORDER — DULOXETINE HCL 30 MG PO CPEP
30.0000 mg | ORAL_CAPSULE | Freq: Every day | ORAL | Status: DC
Start: 2020-08-04 — End: 2020-08-05
  Administered 2020-08-04: 30 mg via ORAL
  Filled 2020-08-03: qty 1

## 2020-08-03 MED ORDER — ATORVASTATIN CALCIUM 40 MG PO TABS
40.0000 mg | ORAL_TABLET | Freq: Every day | ORAL | Status: DC
  Administered 2020-08-04: 40 mg via ORAL
  Filled 2020-08-03: qty 1

## 2020-08-03 MED ORDER — OXYCODONE HCL 5 MG PO TABS: 5.0000 mg | ORAL_TABLET | Freq: Once | ORAL | Status: DC | PRN

## 2020-08-03 MED ORDER — ISOSORB DINITRATE-HYDRALAZINE 20-37.5 MG PO TABS
2.0000 | ORAL_TABLET | Freq: Three times a day (TID) | ORAL | Status: DC
  Administered 2020-08-03 – 2020-08-04 (×4): 2 via ORAL
  Filled 2020-08-03 (×4): qty 2

## 2020-08-03 MED ORDER — OXYCODONE HCL 5 MG/5ML PO SOLN: 5.0000 mg | Freq: Once | ORAL | Status: DC | PRN

## 2020-08-03 MED ORDER — ASPIRIN EC 81 MG PO TBEC
81.0000 mg | DELAYED_RELEASE_TABLET | Freq: Every day | ORAL | Status: DC
  Administered 2020-08-04: 81 mg via ORAL
  Filled 2020-08-03: qty 1

## 2020-08-03 MED ORDER — PROPOFOL 10 MG/ML IV BOLUS
INTRAVENOUS | Status: DC | PRN
  Administered 2020-08-03: 10 mg via INTRAVENOUS
  Administered 2020-08-03: 20 mg via INTRAVENOUS
  Administered 2020-08-03 (×2): 25 mg via INTRAVENOUS

## 2020-08-03 MED ORDER — ONDANSETRON HCL 4 MG/2ML IJ SOLN: 4.0000 mg | Freq: Four times a day (QID) | INTRAMUSCULAR | Status: DC | PRN

## 2020-08-03 MED ORDER — OXYCODONE HCL 5 MG PO TABS: 5.0000 mg | ORAL_TABLET | ORAL | 0 refills | Status: DC | PRN

## 2020-08-03 MED ORDER — ACETAMINOPHEN 325 MG PO TABS
325.0000 mg | ORAL_TABLET | Freq: Four times a day (QID) | ORAL | Status: DC | PRN
  Administered 2020-08-04: 650 mg via ORAL
  Filled 2020-08-03: qty 2

## 2020-08-03 MED ORDER — ONDANSETRON HCL 4 MG/2ML IJ SOLN: 4.0000 mg | Freq: Once | INTRAMUSCULAR | Status: DC | PRN

## 2020-08-03 MED ORDER — ORAL CARE MOUTH RINSE: 15.0000 mL | Freq: Once | OROMUCOSAL | Status: AC

## 2020-08-03 MED ORDER — OXYCODONE HCL 5 MG PO TABS
5.0000 mg | ORAL_TABLET | ORAL | Status: DC | PRN
Start: 2020-08-03 — End: 2020-08-05
  Administered 2020-08-03 – 2020-08-04 (×6): 10 mg via ORAL
  Filled 2020-08-03 (×6): qty 2

## 2020-08-03 MED ORDER — BUPIVACAINE HCL (PF) 0.25 % IJ SOLN
INTRAMUSCULAR | Status: DC | PRN
  Administered 2020-08-03: 10 mL

## 2020-08-03 MED ORDER — ONDANSETRON HCL 4 MG PO TABS: 4.0000 mg | ORAL_TABLET | Freq: Four times a day (QID) | ORAL | Status: DC | PRN

## 2020-08-03 MED ORDER — FENTANYL CITRATE (PF) 100 MCG/2ML IJ SOLN
25.0000 ug | INTRAMUSCULAR | Status: DC | PRN
Start: 2020-08-03 — End: 2020-08-03

## 2020-08-03 MED ORDER — SODIUM CHLORIDE 0.9 % IV SOLN: INTRAVENOUS | Status: DC | PRN

## 2020-08-03 MED ORDER — SODIUM CHLORIDE 0.9 % IV SOLN: INTRAVENOUS | Status: DC

## 2020-08-03 MED ORDER — INSULIN ASPART 100 UNIT/ML ~~LOC~~ SOLN
0.0000 [IU] | Freq: Three times a day (TID) | SUBCUTANEOUS | Status: DC
  Administered 2020-08-03: 3 [IU] via SUBCUTANEOUS

## 2020-08-03 MED ORDER — CHLORHEXIDINE GLUCONATE 0.12 % MT SOLN
15.0000 mL | Freq: Once | OROMUCOSAL | Status: AC
  Administered 2020-08-03: 15 mL via OROMUCOSAL
  Filled 2020-08-03: qty 15

## 2020-08-03 MED ORDER — ALBUTEROL SULFATE HFA 108 (90 BASE) MCG/ACT IN AERS
2.0000 | INHALATION_SPRAY | Freq: Four times a day (QID) | RESPIRATORY_TRACT | Status: DC | PRN
  Filled 2020-08-03: qty 6.7

## 2020-08-03 MED ORDER — FENTANYL CITRATE (PF) 250 MCG/5ML IJ SOLN
INTRAMUSCULAR | Status: AC
  Filled 2020-08-03: qty 5

## 2020-08-03 MED ORDER — INSULIN ASPART PROT & ASPART (70-30 MIX) 100 UNIT/ML ~~LOC~~ SUSP
16.0000 [IU] | Freq: Two times a day (BID) | SUBCUTANEOUS | Status: DC
  Administered 2020-08-03 – 2020-08-04 (×2): 16 [IU] via SUBCUTANEOUS
  Filled 2020-08-03 (×2): qty 10

## 2020-08-03 MED ORDER — AMLODIPINE BESYLATE 5 MG PO TABS
5.0000 mg | ORAL_TABLET | Freq: Every day | ORAL | Status: DC
  Administered 2020-08-04: 5 mg via ORAL
  Filled 2020-08-03: qty 1

## 2020-08-03 MED ORDER — CEFAZOLIN SODIUM-DEXTROSE 2-3 GM-%(50ML) IV SOLR
INTRAVENOUS | Status: DC | PRN
  Administered 2020-08-03: 2 g via INTRAVENOUS

## 2020-08-03 MED ORDER — DORZOLAMIDE HCL-TIMOLOL MAL 2-0.5 % OP SOLN
1.0000 [drp] | Freq: Two times a day (BID) | OPHTHALMIC | Status: DC
  Administered 2020-08-03 (×2): 1 [drp] via OPHTHALMIC
  Filled 2020-08-03: qty 10

## 2020-08-03 MED ORDER — DICYCLOMINE HCL 10 MG PO CAPS
10.0000 mg | ORAL_CAPSULE | Freq: Every day | ORAL | Status: DC
  Administered 2020-08-03: 10 mg via ORAL
  Filled 2020-08-03: qty 1

## 2020-08-03 MED ORDER — BUPIVACAINE HCL (PF) 0.25 % IJ SOLN
INTRAMUSCULAR | Status: AC
  Filled 2020-08-03: qty 30

## 2020-08-03 MED ORDER — TORSEMIDE 20 MG PO TABS
20.0000 mg | ORAL_TABLET | Freq: Every day | ORAL | Status: DC
  Administered 2020-08-03 – 2020-08-04 (×2): 20 mg via ORAL
  Filled 2020-08-03 (×2): qty 1

## 2020-08-03 MED ORDER — METOCLOPRAMIDE HCL 5 MG PO TABS: 5.0000 mg | ORAL_TABLET | Freq: Three times a day (TID) | ORAL | Status: DC | PRN

## 2020-08-03 MED ORDER — ONDANSETRON HCL 4 MG/2ML IJ SOLN
INTRAMUSCULAR | Status: DC | PRN
  Administered 2020-08-03: 4 mg via INTRAVENOUS

## 2020-08-03 MED ORDER — CEFAZOLIN SODIUM-DEXTROSE 2-4 GM/100ML-% IV SOLN
2.0000 g | Freq: Four times a day (QID) | INTRAVENOUS | Status: AC
  Administered 2020-08-03 – 2020-08-04 (×3): 2 g via INTRAVENOUS
  Filled 2020-08-03 (×3): qty 100

## 2020-08-03 MED ORDER — HYDROMORPHONE HCL 1 MG/ML IJ SOLN
0.5000 mg | INTRAMUSCULAR | Status: DC | PRN
Start: 2020-08-03 — End: 2020-08-05
  Filled 2020-08-03 (×2): qty 1

## 2020-08-03 MED ORDER — DOCUSATE SODIUM 100 MG PO CAPS
100.0000 mg | ORAL_CAPSULE | Freq: Two times a day (BID) | ORAL | Status: DC
  Administered 2020-08-03 – 2020-08-04 (×3): 100 mg via ORAL
  Filled 2020-08-03 (×3): qty 1

## 2020-08-03 MED ORDER — NITROGLYCERIN 0.4 MG SL SUBL: 0.4000 mg | SUBLINGUAL_TABLET | SUBLINGUAL | Status: DC | PRN

## 2020-08-03 MED ORDER — CEFAZOLIN SODIUM-DEXTROSE 2-4 GM/100ML-% IV SOLN
2.0000 g | INTRAVENOUS | Status: DC
  Filled 2020-08-03: qty 100

## 2020-08-03 MED ORDER — METOPROLOL SUCCINATE ER 100 MG PO TB24
100.0000 mg | ORAL_TABLET | Freq: Every day | ORAL | Status: DC
  Administered 2020-08-04: 100 mg via ORAL
  Filled 2020-08-03: qty 1

## 2020-08-03 SURGICAL SUPPLY — 29 items
BLADE SAW SGTL MED 73X18.5 STR (BLADE) IMPLANT
BLADE SURG 21 STRL SS (BLADE) ×2 IMPLANT
BNDG COHESIVE 4X5 TAN STRL (GAUZE/BANDAGES/DRESSINGS) ×2 IMPLANT
BNDG GAUZE ELAST 4 BULKY (GAUZE/BANDAGES/DRESSINGS) ×2 IMPLANT
CANISTER WOUNDNEG PRESSURE 500 (CANNISTER) ×2 IMPLANT
COVER SURGICAL LIGHT HANDLE (MISCELLANEOUS) ×4 IMPLANT
COVER WAND RF STERILE (DRAPES) ×2 IMPLANT
DRAPE U-SHAPE 47X51 STRL (DRAPES) ×2 IMPLANT
DRSG ADAPTIC 3X8 NADH LF (GAUZE/BANDAGES/DRESSINGS) IMPLANT
DRSG PAD ABDOMINAL 8X10 ST (GAUZE/BANDAGES/DRESSINGS) IMPLANT
DURAPREP 26ML APPLICATOR (WOUND CARE) ×2 IMPLANT
ELECT REM PT RETURN 9FT ADLT (ELECTROSURGICAL) ×2
ELECTRODE REM PT RTRN 9FT ADLT (ELECTROSURGICAL) ×1 IMPLANT
GAUZE SPONGE 4X4 12PLY STRL (GAUZE/BANDAGES/DRESSINGS) IMPLANT
GLOVE BIOGEL PI IND STRL 9 (GLOVE) ×1 IMPLANT
GLOVE BIOGEL PI INDICATOR 9 (GLOVE) ×1
GLOVE SURG ORTHO 9.0 STRL STRW (GLOVE) ×2 IMPLANT
GOWN STRL REUS W/ TWL XL LVL3 (GOWN DISPOSABLE) ×2 IMPLANT
GOWN STRL REUS W/TWL XL LVL3 (GOWN DISPOSABLE) ×4
KIT BASIN OR (CUSTOM PROCEDURE TRAY) ×2 IMPLANT
KIT TURNOVER KIT B (KITS) ×2 IMPLANT
NS IRRIG 1000ML POUR BTL (IV SOLUTION) ×2 IMPLANT
PACK ORTHO EXTREMITY (CUSTOM PROCEDURE TRAY) ×2 IMPLANT
PAD ARMBOARD 7.5X6 YLW CONV (MISCELLANEOUS) ×4 IMPLANT
STOCKINETTE IMPERVIOUS LG (DRAPES) ×2 IMPLANT
SUT ETHILON 2 0 PSLX (SUTURE) ×2 IMPLANT
TOWEL GREEN STERILE (TOWEL DISPOSABLE) ×2 IMPLANT
TUBE CONNECTING 12X1/4 (SUCTIONS) ×2 IMPLANT
YANKAUER SUCT BULB TIP NO VENT (SUCTIONS) ×2 IMPLANT

## 2020-08-03 NOTE — Anesthesia Preprocedure Evaluation (Signed)
Anesthesia Evaluation  Patient identified by MRN, date of birth, ID band Patient awake    Reviewed: Allergy & Precautions, NPO status , Patient's Chart, lab work & pertinent test results, reviewed documented beta blocker date and time   History of Anesthesia Complications Negative for: history of anesthetic complications  Airway Mallampati: II  TM Distance: >3 FB Neck ROM: Full    Dental  (+) Teeth Intact   Pulmonary neg pulmonary ROS, former smoker,    Pulmonary exam normal        Cardiovascular hypertension, Pt. on medications and Pt. on home beta blockers + CAD, + Cardiac Stents (2018) and +CHF  Normal cardiovascular exam     Neuro/Psych negative neurological ROS  negative psych ROS   GI/Hepatic Neg liver ROS, GERD  ,  Endo/Other  diabetes, Type 2, Insulin Dependent  Renal/GU Renal InsufficiencyRenal disease  negative genitourinary   Musculoskeletal  (+) Arthritis ,   Abdominal   Peds  Hematology  (+) anemia ,   Anesthesia Other Findings   Reproductive/Obstetrics                             Anesthesia Physical Anesthesia Plan  ASA: III  Anesthesia Plan: MAC   Post-op Pain Management:    Induction: Intravenous  PONV Risk Score and Plan: 1 and Propofol infusion, TIVA and Treatment may vary due to age or medical condition  Airway Management Planned: Natural Airway, Nasal Cannula and Simple Face Mask  Additional Equipment: None  Intra-op Plan:   Post-operative Plan:   Informed Consent: I have reviewed the patients History and Physical, chart, labs and discussed the procedure including the risks, benefits and alternatives for the proposed anesthesia with the patient or authorized representative who has indicated his/her understanding and acceptance.       Plan Discussed with:   Anesthesia Plan Comments:         Anesthesia Quick Evaluation

## 2020-08-03 NOTE — Anesthesia Postprocedure Evaluation (Signed)
Anesthesia Post Note  Patient: David [name]  Procedure(s) Performed: LEFT 5TH RAY AMPUTATION (Left Foot)     Patient location during evaluation: PACU Anesthesia Type: MAC Level of consciousness: awake and alert Pain management: pain level controlled Vital Signs Assessment: post-procedure vital signs reviewed and stable Respiratory status: spontaneous breathing, nonlabored ventilation and respiratory function stable Cardiovascular status: blood pressure returned to baseline and stable Postop Assessment: no apparent nausea or vomiting Anesthetic complications: no   No complications documented.  Last Vitals:  Vitals:   08/03/20 1258 08/03/20 1551  BP: 136/69 139/68  Pulse: (!) 54 63  Resp: 16 17  Temp: 36.4 C 36.6 C  SpO2: 99% 98%    Last Pain:  Vitals:   08/03/20 1551  TempSrc: Oral  PainSc:                  Lidia Collum

## 2020-08-03 NOTE — Transfer of Care (Signed)
Immediate Anesthesia Transfer of Care Note  Patient: David [name]  Procedure(s) Performed: LEFT 5TH RAY AMPUTATION (Left Foot)  Patient Location: PACU  Anesthesia Type:MAC  Level of Consciousness: drowsy and patient cooperative  Airway & Oxygen Therapy: Patient Spontanous Breathing  Post-op Assessment: Report given to RN, Post -op Vital signs reviewed and stable and Patient moving all extremities X 4  Post vital signs: Reviewed and stable  Last Vitals:  Vitals Value Taken Time  BP 133/70 08/03/20 1130  Temp    Pulse 53 08/03/20 1130  Resp 13 08/03/20 1130  SpO2 98 % 08/03/20 1130  Vitals shown include unvalidated device data.  Last Pain:  Vitals:   08/03/20 0731  TempSrc: Oral         Complications: No complications documented.

## 2020-08-03 NOTE — H&P (Signed)
David [name] is an 75 y.o. male.   Chief Complaint: left 5th toe osteomyelitis HPI: Patient is a 76 year old gentleman who is seen for multiple issues.  He complains of lateral left hip pain that is periodic not related to any specific activities.  He denies any groin pain.  Patient states he is currently on Demadex for his swelling but has not been taking this as scheduled.  Patient has been wearing his knee-high compression stockings for the venous swelling of both legs and he states the ulcers have healed on both legs.  He complains of callus beneath the first of the fifth metatarsal head of the right foot and complains of a painful ulcer beneath the fifth metatarsal head of the left foot.  Past Medical History:  Diagnosis Date  . Arthritis   . CAD in native artery    s/p stent in 11/18  . CHF (congestive heart failure) ([place])    normal echo in 11/18  . CKD (chronic kidney disease)   . DDD (degenerative disc disease), cervical   . Diabetes mellitus with complication (HCC)    Type II  . Diabetic neuropathy ([place])   . Diabetic retinopathy ([place])   . GERD (gastroesophageal reflux disease)   . Glaucoma   . Hypertension     Past Surgical History:  Procedure Laterality Date  . AMPUTATION Right 07/09/2016   Procedure: Right Great Toe Amputation at Metatarsophalangeal Joint;  Surgeon: [name], MD;  Location: [facility];  Service: Orthopedics;  Laterality: Right;  . AMPUTATION Right 05/28/2018   Procedure: RIGHT SECOND TOE AMPUTATION;  Surgeon: [name], MD;  Location: [facility];  Service: Orthopedics;  Laterality: Right;  . BACK SURGERY     4  . CARDIAC CATHETERIZATION    . CORONARY STENT INTERVENTION N/A 04/06/2017   Procedure: CORONARY STENT INTERVENTION;  Surgeon: Nigel Mormon, MD;  Location: [facility];  Service: Cardiovascular;  Laterality: N/A;  . CORONARY STENT INTERVENTION N/A 04/07/2017   Procedure: CORONARY STENT INTERVENTION;  Surgeon: Nigel Mormon,  MD;  Location: [facility];  Service: Cardiovascular;  Laterality: N/A;  . CYST EXCISION     on Back  . ENDOTRACHEAL INTUBATION EMERGENT  08/07/2018      . JOINT REPLACEMENT    . LAPAROSCOPIC ABDOMINAL EXPLORATION N/A 11/26/2017   Procedure: LAPAROSCOPIC ABDOMINAL EXPLORATION, DRAINAGE OF APPENDICEAL ABCESS. PLACEMENT OF DRAIN;  Surgeon: Alphonsa Overall, MD;  Location: [facility];  Service: General;  Laterality: N/A;  . RIGHT/LEFT HEART CATH AND CORONARY ANGIOGRAPHY N/A 04/06/2017   Procedure: RIGHT/LEFT HEART CATH AND CORONARY ANGIOGRAPHY;  Surgeon: Nigel Mormon, MD;  Location: [facility];  Service: Cardiovascular;  Laterality: N/A;  . TOE SURGERY  05/2018   Left   . TOTAL HIP ARTHROPLASTY     Right     Family History  Problem Relation Age of Onset  . Diabetes Other   . Hyperlipidemia Other   . Hypertension Other   . Stroke Other   . Alzheimer's disease Other   . Thyroid disease Mother   . Diabetes Mellitus II Father   . Alzheimer's disease Father    Social History:  reports that he quit smoking about 26 years ago. His smoking use included cigarettes. He has a 1.25 pack-year smoking history. He has never used smokeless tobacco. He reports current alcohol use. He reports that he does not use drugs.  Allergies: No Known Allergies  No medications prior to admission.  No results found for this or any previous visit (from the past 48 hour(s)). No results found.  Review of Systems  All other systems reviewed and are negative.   There were no vitals taken for this visit. Physical Exam  Patient is alert, oriented, no adenopathy, well-dressed, normal affect, normal respiratory effort. Examination patient has brawny edema in both legs but the skin is wrinkling well and there are no open ulcers.  He has a strong palpable dorsalis pedis pulse and posterior tibial pulse on the left.  Right foot he has callus beneath the first and fifth metatarsal head this was pared  there are no deep ulcers no signs of infection.  He has equinus contracture of both heels with dorsiflexion about 10 degrees short of neutral.  Semination the left foot he has a chronic ulcer beneath the fifth metatarsal head there is hyper granulation tissue it is tender to palpation and there is exposed bone that is palpable at the base of the wound.  There is some mild ischemic changes at the base of the fifth metatarsal his main problem is osteomyelitis of the fifth metatarsal head left foot.Heart RRR Lungs Clear  Assessment/Plan 1. Pain in left hip   2. Diabetic polyneuropathy associated with type 2 diabetes mellitus ([place])   3. Bilateral leg edema   4. Achilles tendon contracture, bilateral   5. Acute osteomyelitis of metatarsal bone, left (HCC)     Plan: We will need to proceed with 5th ray amputation of the left patient does live in a two-story house and most likely will be unable to navigate the stairs so we will admit him after the ray amputation and work with therapy for stair gait training.  Risk and benefits of surgery were discussed patient states he understands wishes to proceed with surgery on Friday    [facility], [place] 08/03/2020, 6:37 AM

## 2020-08-03 NOTE — Evaluation (Signed)
Occupational Therapy Evaluation Patient Details Name: David [name] MRN: [id number] DOB: Apr 23, 1946 Today's Date: 08/03/2020    History of Present Illness Pt is a 75 y/o man who presented with complaints of callus beneath the first of the fifth metatarsal head of the right foot and complains of a painful ulcer beneath the fifth metatarsal head of the left foot. Pt underwent L 5th ray amputation due to osteomyelitis of L 5th metatarsal head.   Clinical Impression   PTA, pt lives with spouse in a two-story home and reports typically able to complete ADLs and ambulatory with RW outside of the home. Pt reports unable to use RW in the home due to clutter. Wife completes ADLs. Pt presents now s/p surgery, initially receptive to therapy education on ADLs, mobility and maintaining precautions. Pt was also collaborative about various DME options for safety and independence at home. However, once it was time to mobilize out of bed, pt became a bit agitated and reported various reasons (need to resituate blankets, etc) for being unable to get out of bed. Despite therapist and wife encouragement, as well as education on need to assess functional status to make adequate DC/DME recommendations, pt adamantly declined any EOB/OOB activities. Hope to further assess pt tomorrow to make finalized DC recommendations pending pt cooperation.     Follow Up Recommendations  (TBD)    Equipment Recommendations   (TBD)    Recommendations for Other Services       Precautions / Restrictions Precautions Precautions: Fall;Other (comment) Precaution Comments: wound vac Required Braces or Orthoses: Other Brace Other Brace: post op shoe Restrictions Weight Bearing Restrictions: Yes LLE Weight Bearing: Touchdown weight bearing      Mobility Bed Mobility               General bed mobility comments: refused    Transfers                 General transfer comment: refused    Balance Overall  balance assessment:  (to be further assessed)                                         ADL either performed or assessed with clinical judgement   ADL Overall ADL's : Needs assistance/impaired                                       General ADL Comments: Unable to fully assess due to pt sudden adamant about not engaging in activities. Educated on safety with DME, tips for LB ADLs, tub transfers when ready to shower, and possible step negotiation     Vision Patient Visual Report: No change from baseline Vision Assessment?: No apparent visual deficits     Perception     Praxis      Pertinent Vitals/Pain Pain Assessment: Faces Faces Pain Scale: No hurt Pain Intervention(s): Monitored during session     Hand Dominance Right   Extremity/Trunk Assessment Upper Extremity Assessment Upper Extremity Assessment: RUE deficits/detail;LUE deficits/detail RUE Deficits / Details: wearing copper gloves due to neuropathy LUE Deficits / Details: wearing copper gloves due to neuropathy   Lower Extremity Assessment Lower Extremity Assessment: Defer to PT evaluation   Cervical / Trunk Assessment Cervical / Trunk Assessment: Normal   Communication Communication Communication: No difficulties  Cognition Arousal/Alertness: Awake/alert Behavior During Therapy: WFL for tasks assessed/performed;Flat affect Overall Cognitive Status: Impaired/Different from baseline Area of Impairment: Memory;Safety/judgement;Awareness;Problem solving                     Memory: Decreased short-term memory;Decreased recall of precautions   Safety/Judgement: Decreased awareness of deficits;Decreased awareness of safety Awareness: Emergent Problem Solving: Requires verbal cues;Slow processing;Difficulty sequencing General Comments: Wife had to correct pt on home setup info. Pt initially receptive to therapy but then when it was time to get out of bed, pt reports its "too  soon for this. i cant stand. dont have my knee braces. will have to fix the blankets. & im cold.", etc. Pt searching for any reason to not get out of bed despite encouragement from therapist and wife, educated extensively on planned DC tomorrow and need to assess functional status to make proper therapy, DME recommendations. When initially educated about steps being a barrier with WB precautions, pt reports "I hadnt thought about that".Marland KitchenMarland KitchenLater, pt reports "Everything you're telling me, I've already thought about". Difficult to reason and redirect   General Comments  Wife present and attempted to motivate pt to participate.    Exercises     Shoulder Instructions      Home Living Family/patient expects to be discharged to:: Private residence Living Arrangements: Spouse/significant other Available Help at Discharge: Family;Available PRN/intermittently Type of Home: House Home Access: Stairs to enter [employer] of Steps: 3 Entrance Stairs-Rails: Right;Left Home Layout: Two level Alternate Level Stairs-Number of Steps: 12-13 Alternate Level Stairs-Rails: Right Bathroom Shower/Tub: Teacher, early years/pre: Standard     Home Equipment: Environmental consultant - 2 wheels;Cane - single point;Shower seat;Other (comment) (B railings built in floor beside toilet)   Additional Comments: wife works part time      Prior Functioning/Environment Level of Independence: Needs assistance  Gait / Transfers Assistance Needed: reports use of RW outside of the home. Unable to use RW in the home due to clutter ADL's / Homemaking Assistance Needed: able to complete ADLs, wife assists with IADLs            OT Problem List: Decreased strength;Decreased activity tolerance;Impaired balance (sitting and/or standing);Decreased safety awareness;Decreased knowledge of use of DME or AE;Decreased knowledge of precautions      OT Treatment/Interventions: Self-care/ADL training;Therapeutic exercise;DME  and/or AE instruction;Therapeutic activities;Patient/family education;Balance training    OT Goals(Current goals can be found in the care plan section) Acute Rehab OT Goals Patient Stated Goal: try therapy later OT Goal Formulation: With patient/family Time For Goal Achievement: 08/17/20 Potential to Achieve Goals: Fair ADL Goals Pt Will Perform Lower Body Bathing: with min assist;sitting/lateral leans;sit to/from stand Pt Will Perform Lower Body Dressing: with min assist;sitting/lateral leans;sit to/from stand Pt Will Transfer to Toilet: with min assist;ambulating Pt Will Perform Toileting - Clothing Manipulation and hygiene: with min assist;sitting/lateral leans;sit to/from stand Additional ADL Goal #1: Pt to demonstrate ability to adhere to WB precautions during ADLs with min verbal cues  OT Frequency: Min 2X/week   Barriers to D/C:            Co-evaluation              AM-PAC OT "6 Clicks" Daily Activity     Outcome Measure Help from another person eating meals?: A Little Help from another person taking care of personal grooming?: A Little Help from another person toileting, which includes using toliet, bedpan, or urinal?: A Lot Help from another person bathing (  including washing, rinsing, drying)?: A Lot Help from another person to put on and taking off regular upper body clothing?: A Little Help from another person to put on and taking off regular lower body clothing?: A Lot 6 Click Score: 15   End of Session Nurse Communication: Mobility status;Weight bearing status  Activity Tolerance: Treatment limited secondary to agitation Patient left: in bed;with call bell/phone within reach;with bed alarm set;with family/visitor present  OT Visit Diagnosis: Unsteadiness on feet (R26.81);Other abnormalities of gait and mobility (R26.89);Muscle weakness (generalized) (M62.81)                Time: [id number] OT Time Calculation (min): 25 min Charges:  OT General Charges $OT  Visit: 1 Visit OT Evaluation $OT Eval Moderate Complexity: 1 Mod OT Treatments $Therapeutic Activity: 8-22 mins  Malachy Chamber, OTR/L Acute Rehab Services Office: [phone]  Layla Maw 08/03/2020, 3:07 PM

## 2020-08-03 NOTE — Interval H&P Note (Signed)
History and Physical Interval Note:  08/03/2020 9:49 AM  David [name]  has presented today for surgery, with the diagnosis of Osteomyelitis Left 5th Metatarsal Head.  The various methods of treatment have been discussed with the patient and family. After consideration of risks, benefits and other options for treatment, the patient has consented to  Procedure(s): LEFT 5TH RAY AMPUTATION (Left) as a surgical intervention.  The patient's history has been reviewed, patient examined, no change in status, stable for surgery.  I have reviewed the patient's chart and labs.  Questions were answered to the patient's satisfaction.     [name]

## 2020-08-03 NOTE — Op Note (Signed)
08/03/2020  11:45 AM  PATIENT:  [name]    PRE-OPERATIVE DIAGNOSIS:  Osteomyelitis Left 5th Metatarsal Head  POST-OPERATIVE DIAGNOSIS:  Same  PROCEDURE:  LEFT 5TH RAY AMPUTATION Local tissue rearrangement for wound closure 9 x 4 cm. Prevena 13 cm wound VAC.  SURGEON:  [name], MD  PHYSICIAN ASSISTANT:None ANESTHESIA:   General  PREOPERATIVE INDICATIONS:  David [name] is a  75 y.o. male with a diagnosis of Osteomyelitis Left 5th Metatarsal Head who failed conservative measures and elected for surgical management.    The risks benefits and alternatives were discussed with the patient preoperatively including but not limited to the risks of infection, bleeding, nerve injury, cardiopulmonary complications, the need for revision surgery, among others, and the patient was willing to proceed.  OPERATIVE IMPLANTS: Praveena 13 cm wound VAC.  _0 @  OPERATIVE FINDINGS: No abscess at the level of amputation.  Tissue margins were clear.  OPERATIVE PROCEDURE: Patient was brought the operating room and underwent a MAC anesthetic.  Patient's left lower extremity was then prepped using DuraPrep draped into a sterile field a timeout was called.  Patient underwent a digital block with 10 cc of quarter percent Marcaine plain.  After adequate levels anesthesia were obtained a racquet incision was made around the toe and the ulcerative tissue and the fifth ray was resected through the base of the fifth metatarsal.  This left a wound that was 9 x 4 cm.  Electrocautery was used hemostasis wound was irrigated with normal saline.  Local tissue rearrangement was used to close the wound with 2-0 nylon.  Wound closure 9 x 4 cm.  A 13 cm wound VAC was applied this had a good suction fit his foot and leg were overwrapped and [place] patient was taken the PACU in stable condition.   DISCHARGE PLANNING:  Antibiotic duration: Continue antibiotics for 24 hours  Weightbearing: Touchdown  weightbearing on the left  Pain medication: Opioid pathway  Dressing care/ Wound VAC: Wound VAC for 1 week  Ambulatory devices: Walker or crutches  Discharge to: Plan for 23-hour observation with discharge to home tomorrow.  Follow-up: In the office 1 week post operative.

## 2020-08-04 ENCOUNTER — Encounter (HOSPITAL_COMMUNITY): Payer: Self-pay | Admitting: Orthopedic Surgery

## 2020-08-04 DIAGNOSIS — M86172 Other acute osteomyelitis, left ankle and foot: Secondary | ICD-10-CM | POA: Diagnosis not present

## 2020-08-04 LAB — GLUCOSE, CAPILLARY
Glucose-Capillary: 57 mg/dL — ABNORMAL LOW (ref 70–99)
Glucose-Capillary: 72 mg/dL (ref 70–99)
Glucose-Capillary: 186 mg/dL — ABNORMAL HIGH (ref 70–99)
Glucose-Capillary: 106 mg/dL — ABNORMAL HIGH (ref 70–99)

## 2020-08-04 MED ORDER — OXYCODONE-ACETAMINOPHEN 5-325 MG PO TABS: 1.0000 | ORAL_TABLET | Freq: Four times a day (QID) | ORAL | 0 refills | Status: DC | PRN

## 2020-08-04 NOTE — Progress Notes (Signed)
Occupational Therapy Treatment Patient Details Name: David [name] MRN: [id number] DOB: 1946/01/11 Today's Date: 08/04/2020    History of present illness Pt is a 75 y/o man who presented with complaints of callus beneath the first of the fifth metatarsal head of the right foot and complains of a painful ulcer beneath the fifth metatarsal head of the left foot. Pt underwent L 5th ray amputation due to osteomyelitis of L 5th metatarsal head.   OT comments  Pt initially reluctant to work with therapy "I am saving my energy for my flight of stairs," but agreed after encouragement. Pt shared that he spends most of the day in his recliner upstairs at his home. Today he was mod A +2 safety for transfer from bed>recliner. "I never get up without my knee braces" which was evident by R knee buckling and very unsteady during SPT. Pt did maintain heel on the ground with LLE (unsure of WB with transfer - educated on TDWB) spent significant time with PT problem solving stairs and bathroom transfers. Pt agreed to "bump up on bottom" up the stairs and PT completing demo of safe transition from top of stairs to chair or to RW. Pt verbalized understanding. OT edcuated specifically for 3 in 1, multiple uses (BSC, toilet riser, shower chair). At this time Pt is not safe for tub transfer. Pt is max A for LB ADL especially with LLE/foot at this time. Pt is set up for grooming and UB ADL. Therapy encouraged Pt to consider ambulance ride home to get in home safety and Pt declined - plans to bump up on bottom 3 steps to get in house. He has a brick walk way. At the end of the session Pt verbalized "This was much harder and more complicated than the last time that I had toes amputated" Pt will require skilled OT at the [facility] level and if he remains in the hospital, OT will continue to follow acutely to focus on safe transfers, safety awareness for ADL.   Follow Up Recommendations  Supervision/Assistance - 24 hour;Home health  OT    Equipment Recommendations  3 in 1 bedside commode;Wheelchair (measurements OT);Wheelchair cushion (measurements OT)    Recommendations for Other Services      Precautions / Restrictions Precautions Precautions: Fall;Other (comment) Precaution Comments: wound vac Required Braces or Orthoses: Other Brace Other Brace: post op shoe Restrictions Weight Bearing Restrictions: Yes LLE Weight Bearing: Touchdown weight bearing       Mobility Bed Mobility Overal bed mobility: Needs Assistance Bed Mobility: Supine to Sit     Supine to sit: Min guard;HOB elevated     General bed mobility comments: use of bed rails to assist, increased time required    Transfers Overall transfer level: Needs assistance Equipment used: Rolling walker (2 wheeled) Transfers: Sit to/from [facility] Sit to Stand: Mod assist;+2 safety/equipment;From elevated surface (vc for safe hand placement, mod A for boost) Stand pivot transfers: Mod assist;+2 safety/equipment;From elevated surface       General transfer comment: Pt typically wears knee braces for transfers (not present at this time) R knee buckling throughout transfer, LLE positioned out with heel for support. Mod A from therapy team and had to use gait belt to assist with hips around to recliner. This transfer was very difficult and would have been very unsafe without assist.    Balance Overall balance assessment: Needs assistance (to be further assessed) Sitting-balance support: Feet supported;Single extremity supported Sitting balance-Leahy Scale: Good Sitting balance - Comments:  for sitting unchallenged EOB   Standing balance support: Bilateral upper extremity supported Standing balance-Leahy Scale: Poor Standing balance comment: HEAVILY dependent on BUE support from RW                           ADL either performed or assessed with clinical judgement   ADL Overall ADL's : Needs  assistance/impaired Eating/Feeding: Independent;Bed level   Grooming: Set up;Sitting   Upper Body Bathing: Set up;Sitting   Lower Body Bathing: Moderate assistance;Sitting/lateral leans   Upper Body Dressing : Set up;Sitting   Lower Body Dressing: Maximal assistance;Sitting/lateral leans Lower Body Dressing Details (indicate cue type and reason): unable to reach down to toes Toilet Transfer: +2 for safety/equipment;Stand-pivot;Moderate assistance;RW;BSC Toilet Transfer Details (indicate cue type and reason): simulated through recliner transfer Toileting- Clothing Manipulation and Hygiene: Min guard;Sitting/lateral lean Toileting - Clothing Manipulation Details (indicate cue type and reason): for urine management - mod A for BM     Functional mobility during ADLs: Moderate assistance;+2 for safety/equipment;Rolling walker;Cueing for sequencing (unsafe - typically wears Bil knee braces for transfers and not present)       Vision   Vision Assessment?: No apparent visual deficits   Perception     Praxis      Cognition Arousal/Alertness: Awake/alert Behavior During Therapy: WFL for tasks assessed/performed;Flat affect Overall Cognitive Status: Impaired/Different from baseline Area of Impairment: Memory;Safety/judgement;Awareness;Problem solving                     Memory: Decreased short-term memory;Decreased recall of precautions   Safety/Judgement: Decreased awareness of deficits;Decreased awareness of safety Awareness: Emergent Problem Solving: Requires verbal cues;Slow processing;Difficulty sequencing General Comments: Wife had to correct pt on home setup info. Pt initially receptive to therapy but then when it was time to get out of bed, pt reports its "too soon for this. i cant stand. dont have my knee braces. will have to fix the blankets. & im cold.", etc. Pt searching for any reason to not get out of bed despite encouragement from therapist and wife, educated  extensively on planned DC tomorrow and need to assess functional status to make proper therapy, DME recommendations. When initially educated about steps being a barrier with WB precautions, pt reports "I hadnt thought about that".Marland KitchenMarland KitchenLater, pt reports "Everything you're telling me, I've already thought about". Difficult to reason and redirect        Exercises     Shoulder Instructions       General Comments wife not present, Pt called wife and she is going to bring knee braces for assist with transfer    Pertinent Vitals/ Pain       Pain Assessment: Faces Faces Pain Scale: Hurts even more Pain Location: L foot at amputation site Pain Descriptors / Indicators: Discomfort;Grimacing;Moaning Pain Intervention(s): Monitored during session;Repositioned  Home Living                                          Prior Functioning/Environment              Frequency  Min 2X/week        Progress Toward Goals  OT Goals(current goals can now be found in the care plan section)  Progress towards OT goals: Progressing toward goals  Acute Rehab OT Goals Patient Stated Goal: get home and inside safely OT Goal Formulation:  With patient Time For Goal Achievement: 08/17/20 Potential to Achieve Goals: [place] Discharge plan needs to be updated;Equipment recommendations need to be updated    Co-evaluation    PT/OT/SLP Co-Evaluation/Treatment: Yes Reason for Co-Treatment: For patient/therapist safety;To address functional/ADL transfers PT goals addressed during session: Mobility/safety with mobility;Balance;Proper use of DME OT goals addressed during session: ADL's and self-care      AM-PAC OT "6 Clicks" Daily Activity     Outcome Measure   Help from another person eating meals?: A Little Help from another person taking care of personal grooming?: A Little Help from another person toileting, which includes using toliet, bedpan, or urinal?: A Lot Help from another  person bathing (including washing, rinsing, drying)?: A Lot Help from another person to put on and taking off regular upper body clothing?: A Little Help from another person to put on and taking off regular lower body clothing?: A Lot 6 Click Score: 15    End of Session Equipment Utilized During Treatment: Gait belt;Rolling walker  OT Visit Diagnosis: Unsteadiness on feet (R26.81);Other abnormalities of gait and mobility (R26.89);Muscle weakness (generalized) (M62.81);History of falling (Z91.81);Pain Pain - Right/Left: Left Pain - part of body: Ankle and joints of foot   Activity Tolerance Patient tolerated treatment well   Patient Left with call bell/phone within reach;in chair   Nurse Communication Mobility status;Weight bearing status (DME needs - EPIC chat to LCSW as well)        Time: [id number] OT Time Calculation (min): 59 min  Charges: OT General Charges $OT Visit: 1 Visit OT Treatments $Self Care/Home Management : 8-22 mins $Therapeutic Activity: 8-22 mins  Jesse Sans OTR/L Acute Rehabilitation Services Pager: [phone] Office: [place] 08/04/2020, 2:40 PM

## 2020-08-04 NOTE — Care Management (Addendum)
WC and RW to be delivered to the room prior to DC.  Spoke w patient and wife at bedside. They requested medical transportation. PTAR set up for 7pm, forms on chart.

## 2020-08-04 NOTE — Progress Notes (Signed)
Dr Sharol Given at bedside. This rn assessed home needs, pt and md concur no PT or home health needed. Verbal md order received for home wound vac.

## 2020-08-04 NOTE — Progress Notes (Signed)
Orthopedic Tech Progress Note Patient Details:  David [name] [birth year] [id number]  Ortho Devices Type of Ortho Device: Postop shoe/boot Ortho Device/Splint Location: LLE Ortho Device/Splint Interventions: Ordered,Application   Post Interventions Patient Tolerated: Well   [name] [name] 08/04/2020, 1:34 PM

## 2020-08-04 NOTE — Care Management (Signed)
    Durable Medical Equipment  (From admission, onward)         Start     Ordered   08/04/20 1519  For home use only DME 3 n 1  Once        08/04/20 1518   08/04/20 1518  For home use only DME lightweight manual wheelchair with seat cushion  Once       Comments: Patient suffers from weakness which impairs their ability to perform daily activities like walking in the home.  A walker will not resolve  issue with performing activities of daily living. A lightweight wheelchair will allow patient to safely perform daily activities. Patient is not able to propel themselves in the home using a standard weight wheelchair due to weakness. Patient can self propel in the lightweight wheelchair. Length of need lifetime. Accessories: elevating leg rests (ELRs), wheel locks, extensions and anti-tippers.   08/04/20 1518

## 2020-08-04 NOTE — Discharge Summary (Signed)
Discharge Diagnoses:  Active Problems:   Osteomyelitis of fifth toe of left foot (HCC)   Abscess of left foot   Surgeries: Procedure(s): LEFT 5TH RAY AMPUTATION on 08/03/2020    Consultants:   Discharged Condition: Improved  Hospital Course: David [name] is an 75 y.o. male who was admitted 08/03/2020 with a chief complaint of osteomyelitis left foot fifth metatarsal head, with a final diagnosis of Osteomyelitis Left 5th Metatarsal Head.  Patient was brought to the operating room on 08/03/2020 and underwent Procedure(s): LEFT 5TH RAY AMPUTATION.    Patient was given perioperative antibiotics:  Anti-infectives (From admission, onward)   Start     Dose/Rate Route Frequency Ordered Stop   08/03/20 1700  ceFAZolin (ANCEF) IVPB 2g/100 mL premix        2 g 200 mL/hr over 30 Minutes Intravenous Every 6 hours 08/03/20 1226 08/04/20 0438   08/03/20 0815  ceFAZolin (ANCEF) IVPB 2g/100 mL premix  Status:  Discontinued        2 g 200 mL/hr over 30 Minutes Intravenous On call to O.R. 08/03/20 [id number] 08/03/20 1226    .  Patient was given sequential compression devices, early ambulation, and aspirin for DVT prophylaxis.  Recent vital signs:  Patient Vitals for the past 24 hrs:  BP Temp Temp src Pulse Resp SpO2  08/04/20 0403 128/63 98.9 F (37.2 C) Oral (!) 58 18 96 %  08/03/20 2352 134/76 98.7 F (37.1 C) Oral 60 16 98 %  08/03/20 1955 134/79 98.6 F (37 C) Oral (!) 57 16 98 %  08/03/20 1551 139/68 97.8 F (36.6 C) Oral 63 17 98 %  08/03/20 1258 136/69 97.6 F (36.4 C) Oral (!) 54 16 99 %  08/03/20 1215 135/68 -- -- (!) 49 10 97 %  08/03/20 1200 (!) 153/75 -- -- (!) 51 11 98 %  08/03/20 1145 135/76 -- -- (!) 51 10 99 %  08/03/20 1130 133/70 97.6 F (36.4 C) -- (!) 53 16 98 %  .  Recent laboratory studies: No results found.  Discharge Medications:   Allergies as of 08/04/2020   No Known Allergies     Medication List    STOP taking these medications    HYDROcodone-acetaminophen 10-325 MG tablet Commonly known as: NORCO     TAKE these medications   albuterol 108 (90 Base) MCG/ACT inhaler Commonly known as: VENTOLIN HFA Inhale 2 puffs into the lungs every 6 (six) hours as needed for wheezing or shortness of breath.   amLODipine 5 MG tablet Commonly known as: NORVASC TAKE 1 TABLET BY MOUTH EVERY DAY   aspirin EC 81 MG tablet Take 81 mg by mouth daily.   atorvastatin 40 MG tablet Commonly known as: LIPITOR TAKE 1 TABLET BY MOUTH EVERY DAY   BiDil 20-37.5 MG tablet Generic drug: isosorbide-hydrALAZINE TAKE 2 TABLETS BY MOUTH 2 TIMES A DAY. What changed: See the new instructions.   dicyclomine 10 MG capsule Commonly known as: BENTYL Take 1 capsule (10 mg total) by mouth 4 (four) times daily -  before meals and at bedtime. What changed: when to take this   dorzolamide-timolol 22.3-6.8 MG/ML ophthalmic solution Commonly known as: COSOPT Place 1 drop into both eyes 2 (two) times daily.   DULoxetine 30 MG capsule Commonly known as: CYMBALTA Take 30 mg by mouth daily.   ferrous sulfate 325 (65 FE) MG tablet Take 325 mg by mouth daily with breakfast.   HumaLOG Mix 75/25 KwikPen (75-25) 100 UNIT/ML Kwikpen Generic drug:  Insulin Lispro Prot & Lispro Inject 22 Units into the skin in the morning and at bedtime.   metoprolol succinate 100 MG 24 hr tablet Commonly known as: TOPROL-XL Take 1 tablet (100 mg total) by mouth daily. Take with or immediately following a meal.   MUCINEX PO Take 1,200 mg by mouth daily.   multivitamin with minerals Tabs tablet Take 1 tablet by mouth daily.   nitroGLYCERIN 0.4 MG SL tablet Commonly known as: NITROSTAT Place 0.4 mg under the tongue every 5 (five) minutes as needed for chest pain.   oxyCODONE 5 MG immediate release tablet Commonly known as: Oxy IR/ROXICODONE Take 1-2 tablets (5-10 mg total) by mouth every 4 (four) hours as needed for moderate pain (pain score 4-6).   torsemide  20 MG tablet Commonly known as: DEMADEX Take 1 tablet (20 mg total) by mouth 2 (two) times daily. What changed: when to take this   traZODone 100 MG tablet Commonly known as: DESYREL Take 100 mg by mouth at bedtime.       Diagnostic Studies: XR HIP UNILAT W OR W/O PELVIS 2-3 VIEWS LEFT  Result Date: 07/31/2020 2 view radiographs of the left hip shows mild arthritic changes.  The visualized portion of the lumbar spine shows advanced degenerative arthritic changes with large osteophytic bone spurs.   Patient benefited maximally from their hospital stay and there were no complications.     Disposition: Discharge disposition: 01-Home or Self Care      Discharge Instructions    Call MD / Call 911   Complete by: As directed    If you experience chest pain or shortness of breath, CALL 911 and be transported to the hospital emergency room.  If you develope a fever above 101 F, pus (white drainage) or increased drainage or redness at the wound, or calf pain, call your surgeon's office.   Call MD / Call 911   Complete by: As directed    If you experience chest pain or shortness of breath, CALL 911 and be transported to the hospital emergency room.  If you develope a fever above 101 F, pus (white drainage) or increased drainage or redness at the wound, or calf pain, call your surgeon's office.   Constipation Prevention   Complete by: As directed    Drink plenty of fluids.  Prune juice may be helpful.  You may use a stool softener, such as Colace (over the counter) 100 mg twice a day.  Use MiraLax (over the counter) for constipation as needed.   Constipation Prevention   Complete by: As directed    Drink plenty of fluids.  Prune juice may be helpful.  You may use a stool softener, such as Colace (over the counter) 100 mg twice a day.  Use MiraLax (over the counter) for constipation as needed.   Diet - low sodium heart healthy   Complete by: As directed    Diet - low sodium heart healthy    Complete by: As directed    Discharge instructions   Complete by: As directed    Touchdown weightbearing. Elevate Foot. Call office if vac alarms   Increase activity slowly as tolerated   Complete by: As directed    Increase activity slowly as tolerated   Complete by: As directed    Negative Pressure Wound Therapy - Incisional   Complete by: As directed    Show patient how to attch prevena vac. Should call office if alarms      Follow-up  Information    Suzan Slick, NP In 1 week.   Specialty: Orthopedic Surgery Contact information: [address] [place] [address] [phone]                Signed: [name] 08/04/2020, 10:34 AM

## 2020-08-04 NOTE — Progress Notes (Addendum)
Discharge orders reviewed and given by nurse malinda, praveena wound vac assembled and connected by nurse malinda. This rn informed pt/wife dr Lorin Mercy instruction to f/u and make appointment with pt cardiologist to be seen in next few days, pt states he already has a scheduled appt for next week. Pt discharged home with wheelchair, rollator walker and 3 in 1

## 2020-08-04 NOTE — Progress Notes (Signed)
Patient ID: David [name], male   DOB: 1946-04-17, 75 y.o.   MRN: [id number] Patient has no complaints this morning wishes to discharge today.  Patient will need the portable Praveena wound VAC pump for discharge prescription sent to his pharmacy for Percocet.

## 2020-08-04 NOTE — Evaluation (Signed)
Physical Therapy Evaluation Patient Details Name: David [name] MRN: [id number] DOB: May 28, 1945 Today's Date: 08/04/2020   History of Present Illness  Pt is a 75 y/o man who presented with complaints of callus beneath the first of the fifth metatarsal head of the right foot and complains of a painful ulcer beneath the fifth metatarsal head of the left foot. Pt underwent L 5th ray amputation due to osteomyelitis of L 5th metatarsal head.  Clinical Impression   Patient is s/p above surgery resulting in functional limitations due to the deficits listed below (see PT Problem List). Lives at home with his wife (works part-time) in a two-story home with 3 steps to enter; Flight of steps with one rail on the R side to access his bedroom and bathroom; Prior to this admission, pt was modified independent with mobiltiy and ADLs, used a RW occasionally; Has been using bilateral knee braces for stability for at least a year -- indicated his knees tend to buckle without his braces; Presents to PT with LLE weight bearing restrictions, generalized functional weakness, effecting independence and safety with ADLs; Currently requires Moderate assist of 2 people and use of RW for safe transfer to stand and stand pivot to recliner; The fact that he is to keep weight off of his LLE is making walking unsafe; recommend wheelchair to ensure safe mobility while keeping weight off of his LLE;  Lengthy discussion re: options for stair management, and ultimately, David [name] stated he plans to sit and bump up the stair backwards, which is likely the most manageable way to get up the steps; we discussed and demonstrated options for getting up into a chair at the top of his stairs (i.e. via stepstool, or going through quadruped); He voiced understanding; Briefly discussed perhaps an ambulance ride home (which would be the easiest way up the stairs), however pt declined; Patient will benefit from skilled PT to increase their  independence and safety with mobility to allow discharge to the venue listed below.       Follow Up Recommendations Home health PT;Supervision/Assistance - 24 hour    Equipment Recommendations  Rolling walker with 5" wheels;3in1 (PT);Wheelchair (measurements PT);Wheelchair cushion (measurements PT);Other (comment) (consider ambulance ride home; pt will likely decline)    Recommendations for Other Services OT consult     Precautions / Restrictions Precautions Precautions: Fall;Other (comment) Precaution Comments: wound vac Required Braces or Orthoses: Other Brace Other Brace: post op shoe Restrictions Weight Bearing Restrictions: Yes LLE Weight Bearing: Touchdown weight bearing      Mobility  Bed Mobility Overal bed mobility: Needs Assistance Bed Mobility: Supine to Sit     Supine to sit: Supervision     General bed mobility comments: use of bed rails to assist, increased time required    Transfers Overall transfer level: Needs assistance Equipment used: Rolling walker (2 wheeled) Transfers: Sit to/from [facility] Sit to Stand: Mod assist;+2 safety/equipment;From elevated surface (vc for safe hand placement, mod A for boost) Stand pivot transfers: Mod assist;+2 safety/equipment;From elevated surface       General transfer comment: Pt typically wears knee braces for transfers (not present at this time) R knee buckling throughout transfer, LLE positioned out with heel for support. Mod A from therapy team and had to use gait belt to assist with hips around to recliner. This transfer was very difficult and would have been very unsafe without assist.  Ambulation/Gait             General Gait  Details: Given difficulty with standing and pivot transfers, opted to hold ambulation trials at this time  Stairs         General stair comments: Lengthy discussion re: options for stair management, and ultimately, David [name] stated he plans to sit and bump  up the stair backwards, which is likely the most manageable way to get up the steps; we discussed and demonstrated options for getting up into a cahir at the top of his stairs (i.e. via stepstool, or getting to quadruped); He voiced understanding; Briefly discussed perhaps an ambulance ride home (which would be the easiest way up the stairs), however pt declined  Wheelchair Mobility    Modified Rankin (Stroke Patients Only)       Balance Overall balance assessment: Needs assistance (to be further assessed) Sitting-balance support: Feet supported;Single extremity supported Sitting balance-Leahy Scale: Good Sitting balance - Comments: for sitting unchallenged EOB   Standing balance support: Bilateral upper extremity supported Standing balance-Leahy Scale: Poor Standing balance comment: HEAVILY dependent on BUE support from RW                             Pertinent Vitals/Pain Pain Assessment: Faces Faces Pain Scale: Hurts even more Pain Location: L foot at amputation site Pain Descriptors / Indicators: Discomfort;Grimacing;Moaning Pain Intervention(s): Monitored during session    Home Living Family/patient expects to be discharged to:: Private residence Living Arrangements: Spouse/significant other Available Help at Discharge: Family;Available PRN/intermittently Type of Home: House Home Access: Stairs to enter Entrance Stairs-Rails: Psychiatric nurse of Steps: 3 Home Layout: Two level Home Equipment: [place] - 2 wheels;Cane - single point;Shower seat;Other (comment) (B railings built in floor beside toilet) Additional Comments: wife works part time    Prior Function Level of Independence: Needs assistance   Gait / Transfers Assistance Needed: reports use of RW outside of the home. Unable to use RW in the home due to clutter  ADL's / Homemaking Assistance Needed: able to complete ADLs, wife assists with IADLs        Hand Dominance   Dominant  Hand: Right    Extremity/Trunk Assessment   Upper Extremity Assessment Upper Extremity Assessment: Defer to OT evaluation RUE Deficits / Details: wearing copper gloves due to neuropathy LUE Deficits / Details: wearing copper gloves due to neuropathy    Lower Extremity Assessment Lower Extremity Assessment: RLE deficits/detail;LLE deficits/detail;Generalized weakness RLE Deficits / Details: Functional weakness present; typically wears knee brace for mobility with RW, and R foot has previous toe amputations LLE Deficits / Details: typically wears knee brace for support, wound vac in place and post-op shoe applied       Communication   Communication: No difficulties  Cognition Arousal/Alertness: Awake/alert Behavior During Therapy: WFL for tasks assessed/performed;Flat affect Overall Cognitive Status: Impaired/Different from baseline Area of Impairment: Memory;Safety/judgement;Awareness;Problem solving                     Memory: Decreased short-term memory;Decreased recall of precautions   Safety/Judgement: Decreased awareness of deficits;Decreased awareness of safety Awareness: Emergent Problem Solving: Requires verbal cues;Slow processing;Difficulty sequencing General Comments: Wife had to correct pt on home setup info. Pt initially receptive to therapy but then when it was time to get out of bed, pt reports its "too soon for this. i cant stand. dont have my knee braces. will have to fix the blankets. & im cold.", etc. Pt searching for any reason to not get out of bed  despite encouragement from therapist and wife, educated extensively on planned DC tomorrow and need to assess functional status to make proper therapy, DME recommendations. When initially educated about steps being a barrier with WB precautions, pt reports "I hadnt thought about that".Marland KitchenMarland KitchenLater, pt reports "Everything you're telling me, I've already thought about". Difficult to reason and redirect      General  Comments General comments (skin integrity, edema, etc.): wife not present, Pt called wife and she is going to bring knee braces for assist with transfer    Exercises     Assessment/Plan    PT Assessment Patient needs continued PT services  PT Problem List Decreased strength;Decreased range of motion;Decreased activity tolerance;Decreased mobility;Decreased balance;Decreased coordination;Decreased cognition;Decreased knowledge of use of DME;Decreased safety awareness;Decreased knowledge of precautions;Decreased skin integrity;Pain       PT Treatment Interventions DME instruction;Gait training;Stair training;Functional mobility training;Therapeutic activities;Therapeutic exercise;Balance training;Neuromuscular re-education;Cognitive remediation;Patient/family education;Wheelchair mobility training    PT Goals (Current goals can be found in the Care Plan section)  Acute Rehab PT Goals Patient Stated Goal: get home and inside safely PT Goal Formulation: With patient Time For Goal Achievement: 08/18/20 Potential to Achieve Goals: Good    Frequency Min 3X/week   Barriers to discharge Other (comment) Flight of steps to access bedroom/bathroom    Co-evaluation PT/OT/SLP Co-Evaluation/Treatment: Yes Reason for Co-Treatment: For patient/therapist safety PT goals addressed during session: Mobility/safety with mobility OT goals addressed during session: ADL's and self-care       AM-PAC PT "6 Clicks" Mobility  Outcome Measure Help needed turning from your back to your side while in a flat bed without using bedrails?: None Help needed moving from lying on your back to sitting on the side of a flat bed without using bedrails?: A Little Help needed moving to and from a bed to a chair (including a wheelchair)?: A Lot Help needed standing up from a chair using your arms (e.g., wheelchair or bedside chair)?: A Lot Help needed to walk in hospital room?: Total Help needed climbing 3-5 steps with  a railing? : Total 6 Click Score: 13    End of Session Equipment Utilized During Treatment: Gait belt Activity Tolerance: Patient tolerated treatment well Patient left: in chair;with call bell/phone within reach Nurse Communication: Mobility status;Other (comment) (DME needs for dc home) PT Visit Diagnosis: Unsteadiness on feet (R26.81);Other abnormalities of gait and mobility (R26.89);Muscle weakness (generalized) (M62.81);Pain Pain - Right/Left: Left Pain - part of body:  (Foot)    Time: [id number] PT Time Calculation (min) (ACUTE ONLY): 53 min   Charges:   PT Evaluation $PT Eval Moderate Complexity: 1 Mod PT Treatments $Therapeutic Activity: 8-22 mins        Roney Marion, PT  Acute Rehabilitation Services Pager [phone] Office [phone]   [place]ta [place] 08/04/2020, 3:21 PM

## 2020-08-04 NOTE — Progress Notes (Signed)
Patient had low CBG of 57. 4OZ orange juice given and follow up CBG is 72. Will continue to monitor

## 2020-08-04 NOTE — Progress Notes (Signed)
Discharge orders for pt placed by md. Pt states wife not able to transport him home until 1300.

## 2020-08-06 ENCOUNTER — Ambulatory Visit: Payer: Medicare Other | Admitting: Cardiology

## 2020-08-10 ENCOUNTER — Encounter: Payer: Self-pay | Admitting: Family

## 2020-08-10 ENCOUNTER — Ambulatory Visit (INDEPENDENT_AMBULATORY_CARE_PROVIDER_SITE_OTHER): Payer: Medicare Other | Admitting: Family

## 2020-08-10 DIAGNOSIS — Z89432 Acquired absence of left foot: Secondary | ICD-10-CM

## 2020-08-10 NOTE — Progress Notes (Signed)
Post-Op Visit Note   Patient: David [name]           Date of Birth: 1946/02/25           MRN: [id number] Visit Date: 08/10/2020 PCP: [name], MD  Chief Complaint: No chief complaint on file.   HPI:  HPI The patient is a 75 year old gentleman seen today 1 week status post left fifth ray amputation.  Wound VAC is in place.  Reports this has not been working or draining since surgery.  He has had some bloody drainage out of the surgical dressing.  He is also not been able to take his torsemide as prescribed has been taking it once daily.  His wife reports he is also been doing heel walking. Ortho Exam On examination of the left lower extremity there is pitting edema to the left lower extremity up to the tibial tubercle.  The incision is puckering along the entire length.  There is some gaping no probing.  There is scant bloody drainage there is some surrounding maceration.  No erythema no purulence  Visit Diagnoses: No diagnosis found.  Plan: We will apply a Dynaflex wrap with silver cell over the incision discussed importance of strict nonweightbearing elevation for swelling taking torsemide as prescribed  Follow-Up Instructions: Return in about 4 days (around 08/14/2020).   Imaging: No results found.  Orders:  No orders of the defined types were placed in this encounter.  No orders of the defined types were placed in this encounter.    PMFS History: Patient Active Problem List   Diagnosis Date Noted  . Abscess of left foot 08/03/2020  . Osteomyelitis of fifth toe of left foot ([place])   . Mixed hyperlipidemia 10/15/2018  . Bilateral leg edema 09/17/2018  . Stage 3b chronic kidney disease ([place]) 09/10/2018  . Anemia 09/10/2018  . Pain and swelling of wrist, right 08/14/2018  . Bilateral carotid artery stenosis 07/30/2018  . Coronary artery disease involving native coronary artery of native heart without angina pectoris 07/30/2018  . Snoring 02/11/2018  . Chronic  heart failure with preserved ejection fraction (HFpEF) ([place]) 12/22/2017  . At risk for adverse drug reaction 12/15/2017  . Chronic pain syndrome 12/15/2017  . Status post coronary artery stent placement   . Multifocal pneumonia 03/30/2017  . Insulin-requiring or dependent type II diabetes mellitus ([place]) 03/29/2017  . Acute on chronic respiratory failure with hypoxia ([place])   . Pulmonary congestion   . Acquired contracture of Achilles tendon, right 08/19/2016  . Amputated great toe, right ([place]) 07/18/2016  . Onychomycosis 05/29/2016  . Diabetic polyneuropathy associated with type 2 diabetes mellitus ([place]) 04/09/2016  . Right foot ulcer, limited to breakdown of skin ([place]) 04/09/2016  . Unspecified hereditary and idiopathic peripheral neuropathy 09/15/2013  . Malaise 08/14/2013  . Essential hypertension 08/14/2013  . Chronic back pain 08/14/2013  . Glaucoma 08/14/2013  . Headache 08/14/2013   Past Medical History:  Diagnosis Date  . Arthritis   . CAD in native artery    s/p stent in 11/18  . CHF (congestive heart failure) ([place])    normal echo in 11/18  . CKD (chronic kidney disease)   . DDD (degenerative disc disease), cervical   . Diabetes mellitus with complication (HCC)    Type II  . Diabetic neuropathy ([place])   . Diabetic retinopathy ([place])   . GERD (gastroesophageal reflux disease)   . Glaucoma   . Hypertension     Family History  Problem Relation Age  of Onset  . Diabetes Other   . Hyperlipidemia Other   . Hypertension Other   . Stroke Other   . Alzheimer's disease Other   . Thyroid disease Mother   . Diabetes Mellitus II Father   . Alzheimer's disease Father     Past Surgical History:  Procedure Laterality Date  . AMPUTATION Right 07/09/2016   Procedure: Right Great Toe Amputation at Metatarsophalangeal Joint;  Surgeon: [name], MD;  Location: [facility];  Service: Orthopedics;  Laterality: Right;  . AMPUTATION Right 05/28/2018   Procedure: RIGHT SECOND TOE  AMPUTATION;  Surgeon: [name], MD;  Location: [facility];  Service: Orthopedics;  Laterality: Right;  . AMPUTATION Left 08/03/2020   Procedure: LEFT 5TH RAY AMPUTATION;  Surgeon: [name], MD;  Location: [facility];  Service: Orthopedics;  Laterality: Left;  . BACK SURGERY     4  . CARDIAC CATHETERIZATION    . CORONARY STENT INTERVENTION N/A 04/06/2017   Procedure: CORONARY STENT INTERVENTION;  Surgeon: Nigel Mormon, MD;  Location: [facility];  Service: Cardiovascular;  Laterality: N/A;  . CORONARY STENT INTERVENTION N/A 04/07/2017   Procedure: CORONARY STENT INTERVENTION;  Surgeon: Nigel Mormon, MD;  Location: [facility];  Service: Cardiovascular;  Laterality: N/A;  . CYST EXCISION     on Back  . ENDOTRACHEAL INTUBATION EMERGENT  08/07/2018      . JOINT REPLACEMENT    . LAPAROSCOPIC ABDOMINAL EXPLORATION N/A 11/26/2017   Procedure: LAPAROSCOPIC ABDOMINAL EXPLORATION, DRAINAGE OF APPENDICEAL ABCESS. PLACEMENT OF DRAIN;  Surgeon: Alphonsa Overall, MD;  Location: [facility];  Service: General;  Laterality: N/A;  . RIGHT/LEFT HEART CATH AND CORONARY ANGIOGRAPHY N/A 04/06/2017   Procedure: RIGHT/LEFT HEART CATH AND CORONARY ANGIOGRAPHY;  Surgeon: Nigel Mormon, MD;  Location: [facility];  Service: Cardiovascular;  Laterality: N/A;  . TOE SURGERY  05/2018   Left   . TOTAL HIP ARTHROPLASTY     Right    Social History   Occupational History  . Occupation: Retired  Tobacco Use  . Smoking status: Former Smoker    Packs/day: 0.25    Years: 5.00    Pack years: 1.25    Types: Cigarettes    Quit date: 1996    Years since quitting: 26.2  . Smokeless tobacco: Never Used  Vaping Use  . Vaping Use: Never used  Substance and Sexual Activity  . Alcohol use: Yes    Comment: occ  . Drug use: No  . Sexual activity: Yes

## 2020-08-14 ENCOUNTER — Encounter: Payer: Self-pay | Admitting: Orthopedic Surgery

## 2020-08-14 ENCOUNTER — Ambulatory Visit (INDEPENDENT_AMBULATORY_CARE_PROVIDER_SITE_OTHER): Payer: Medicare Other | Admitting: Physician Assistant

## 2020-08-14 DIAGNOSIS — M869 Osteomyelitis, unspecified: Secondary | ICD-10-CM

## 2020-08-14 MED ORDER — PENTOXIFYLLINE ER 400 MG PO TBCR: 400.0000 mg | EXTENDED_RELEASE_TABLET | Freq: Three times a day (TID) | ORAL | 3 refills | Status: DC

## 2020-08-14 MED ORDER — OXYCODONE-ACETAMINOPHEN 5-325 MG PO TABS: 1.0000 | ORAL_TABLET | Freq: Four times a day (QID) | ORAL | 0 refills | Status: AC | PRN

## 2020-08-14 MED ORDER — NITROGLYCERIN 0.2 MG/HR TD PT24: 0.2000 mg | MEDICATED_PATCH | Freq: Every day | TRANSDERMAL | 12 refills | Status: DC

## 2020-08-14 NOTE — Progress Notes (Signed)
Office Visit Note   Patient: David [name]           Date of Birth: 02/16/1946           MRN: [id number] Visit Date: 08/14/2020              Requested by: [name], MD [address] [place],  [place] [address] PCP: [name], MD  Chief Complaint  Patient presents with  . Left Foot - Follow-up      HPI: Patient presents today 2 weeks status post left fifth ray amputation.  He was placed in a compression wrap last week because of significant swelling in his left lower leg.  He still continues to place weight on his heel.  Assessment & Plan: Visit Diagnoses: No diagnosis found.  Plan: Discussed the importance of remaining nonweightbearing.  He does have some necrosis and some dehiscence of the wound so I have ordered him an instructed his family on how to place a nitroglycerin patch as well as placing him on Trental.  He will follow-up in 1 week.  Follow-Up Instructions: No follow-ups on file.   Ortho Exam  Patient is alert, oriented, no adenopathy, well-dressed, normal affect, normal respiratory effort. Examination significant decrease in swelling from what was described a week ago.  Wound has some dehiscence and at the proximal end some superficial necrosis.  There is no surrounding erythema or no ascending cellulitis.  He has a strong palpable dorsalis pedis pulse.  No foul odor no purulent drainage  Imaging: No results found. No images are attached to the encounter.  Labs: Lab Results  Component Value Date   HGBA1C 6.2 (H) 08/03/2020   HGBA1C 7.1 (H) 05/28/2018   HGBA1C 6.3 (H) 12/19/2017   ESRSEDRATE 73 (H) 02/07/2015   ESRSEDRATE 22 (H) 09/15/2013   CRP 8.2 (H) 02/07/2015   LABURIC 4.4 08/12/2018   REPTSTATUS 08/16/2018 FINAL 08/15/2018   GRAMSTAIN  08/07/2018    MODERATE WBC PRESENT, PREDOMINANTLY PMN NO ORGANISMS SEEN    CULT MULTIPLE SPECIES PRESENT, SUGGEST RECOLLECTION (A) 08/15/2018     Lab Results  Component Value Date    ALBUMIN 3.7 09/10/2018   ALBUMIN 2.5 (L) 08/15/2018   ALBUMIN 2.4 (L) 08/14/2018    Lab Results  Component Value Date   MG 1.9 08/15/2018   MG 1.8 08/14/2018   MG 1.7 08/11/2018   No results found for: VD25OH  No results found for: PREALBUMIN CBC EXTENDED Latest Ref Rng & Units 08/03/2020 09/10/2018 08/15/2018  WBC 4.0 - 10.5 K/uL 8.0 6.3 19.5(H)  RBC 4.22 - 5.81 MIL/uL 2.70(L) 2.18(LL) 2.35(L)  HGB 13.0 - 17.0 g/dL 9.5(L) 8.0(L) 7.8(L)  HCT 39.0 - 52.0 % 30.3(L) 23.6(L) 25.2(L)  PLT 150 - 400 K/uL 232 206 315  NEUTROABS 1.7 - 7.7 K/uL - - 18.0(H)  LYMPHSABS 0.7 - 4.0 K/uL - - 0.9     There is no height or weight on file to calculate BMI.  Orders:  No orders of the defined types were placed in this encounter.  Meds ordered this encounter  Medications  . pentoxifylline (TRENTAL) 400 MG CR tablet    Sig: Take 1 tablet (400 mg total) by mouth 3 (three) times daily with meals.    Dispense:  90 tablet    Refill:  3  . nitroGLYCERIN (NITRODUR - DOSED IN MG/24 HR) 0.2 mg/hr patch    Sig: Place 1 patch (0.2 mg total) onto the skin daily.  Dispense:  30 patch    Refill:  12  . oxyCODONE-acetaminophen (PERCOCET) 5-325 MG tablet    Sig: Take 1 tablet by mouth every 6 (six) hours as needed for severe pain.    Dispense:  30 tablet    Refill:  0    Post op pain     Procedures: No procedures performed  Clinical Data: No additional findings.  ROS:  All other systems negative, except as noted in the HPI. Review of Systems  Objective: Vital Signs: There were no vitals taken for this visit.  Specialty Comments:  No specialty comments available.  PMFS History: Patient Active Problem List   Diagnosis Date Noted  . Abscess of left foot 08/03/2020  . Osteomyelitis of fifth toe of left foot ([place])   . Mixed hyperlipidemia 10/15/2018  . Bilateral leg edema 09/17/2018  . Stage 3b chronic kidney disease ([place]) 09/10/2018  . Anemia 09/10/2018  . Pain and swelling of wrist,  right 08/14/2018  . Bilateral carotid artery stenosis 07/30/2018  . Coronary artery disease involving native coronary artery of native heart without angina pectoris 07/30/2018  . Snoring 02/11/2018  . Chronic heart failure with preserved ejection fraction (HFpEF) ([place]) 12/22/2017  . At risk for adverse drug reaction 12/15/2017  . Chronic pain syndrome 12/15/2017  . Status post coronary artery stent placement   . Multifocal pneumonia 03/30/2017  . Insulin-requiring or dependent type II diabetes mellitus ([place]) 03/29/2017  . Acute on chronic respiratory failure with hypoxia ([place])   . Pulmonary congestion   . Acquired contracture of Achilles tendon, right 08/19/2016  . Amputated great toe, right ([place]) 07/18/2016  . Onychomycosis 05/29/2016  . Diabetic polyneuropathy associated with type 2 diabetes mellitus ([place]) 04/09/2016  . Right foot ulcer, limited to breakdown of skin ([place]) 04/09/2016  . Unspecified hereditary and idiopathic peripheral neuropathy 09/15/2013  . Malaise 08/14/2013  . Essential hypertension 08/14/2013  . Chronic back pain 08/14/2013  . Glaucoma 08/14/2013  . Headache 08/14/2013   Past Medical History:  Diagnosis Date  . Arthritis   . CAD in native artery    s/p stent in 11/18  . CHF (congestive heart failure) ([place])    normal echo in 11/18  . CKD (chronic kidney disease)   . DDD (degenerative disc disease), cervical   . Diabetes mellitus with complication (HCC)    Type II  . Diabetic neuropathy ([place])   . Diabetic retinopathy ([place])   . GERD (gastroesophageal reflux disease)   . Glaucoma   . Hypertension     Family History  Problem Relation Age of Onset  . Diabetes Other   . Hyperlipidemia Other   . Hypertension Other   . Stroke Other   . Alzheimer's disease Other   . Thyroid disease Mother   . Diabetes Mellitus II Father   . Alzheimer's disease Father     Past Surgical History:  Procedure Laterality Date  . AMPUTATION Right 07/09/2016   Procedure:  Right Great Toe Amputation at Metatarsophalangeal Joint;  Surgeon: [name], MD;  Location: [facility];  Service: Orthopedics;  Laterality: Right;  . AMPUTATION Right 05/28/2018   Procedure: RIGHT SECOND TOE AMPUTATION;  Surgeon: [name], MD;  Location: [facility];  Service: Orthopedics;  Laterality: Right;  . AMPUTATION Left 08/03/2020   Procedure: LEFT 5TH RAY AMPUTATION;  Surgeon: [name], MD;  Location: [facility];  Service: Orthopedics;  Laterality: Left;  . BACK SURGERY     4  . CARDIAC CATHETERIZATION    .  CORONARY STENT INTERVENTION N/A 04/06/2017   Procedure: CORONARY STENT INTERVENTION;  Surgeon: Nigel Mormon, MD;  Location: [facility];  Service: Cardiovascular;  Laterality: N/A;  . CORONARY STENT INTERVENTION N/A 04/07/2017   Procedure: CORONARY STENT INTERVENTION;  Surgeon: Nigel Mormon, MD;  Location: [facility];  Service: Cardiovascular;  Laterality: N/A;  . CYST EXCISION     on Back  . ENDOTRACHEAL INTUBATION EMERGENT  08/07/2018      . JOINT REPLACEMENT    . LAPAROSCOPIC ABDOMINAL EXPLORATION N/A 11/26/2017   Procedure: LAPAROSCOPIC ABDOMINAL EXPLORATION, DRAINAGE OF APPENDICEAL ABCESS. PLACEMENT OF DRAIN;  Surgeon: Alphonsa Overall, MD;  Location: [facility];  Service: General;  Laterality: N/A;  . RIGHT/LEFT HEART CATH AND CORONARY ANGIOGRAPHY N/A 04/06/2017   Procedure: RIGHT/LEFT HEART CATH AND CORONARY ANGIOGRAPHY;  Surgeon: Nigel Mormon, MD;  Location: [facility];  Service: Cardiovascular;  Laterality: N/A;  . TOE SURGERY  05/2018   Left   . TOTAL HIP ARTHROPLASTY     Right    Social History   Occupational History  . Occupation: Retired  Tobacco Use  . Smoking status: Former Smoker    Packs/day: 0.25    Years: 5.00    Pack years: 1.25    Types: Cigarettes    Quit date: 1996    Years since quitting: 26.2  . Smokeless tobacco: Never Used  Vaping Use  . Vaping Use: Never used  Substance and Sexual Activity  . Alcohol  use: Yes    Comment: occ  . Drug use: No  . Sexual activity: Yes

## 2020-08-16 ENCOUNTER — Other Ambulatory Visit: Payer: Self-pay | Admitting: Cardiology

## 2020-08-16 DIAGNOSIS — I5030 Unspecified diastolic (congestive) heart failure: Secondary | ICD-10-CM

## 2020-08-16 DIAGNOSIS — I5032 Chronic diastolic (congestive) heart failure: Secondary | ICD-10-CM

## 2020-08-21 ENCOUNTER — Encounter: Payer: Self-pay | Admitting: Orthopedic Surgery

## 2020-08-21 ENCOUNTER — Ambulatory Visit (INDEPENDENT_AMBULATORY_CARE_PROVIDER_SITE_OTHER): Payer: Medicare Other | Admitting: Physician Assistant

## 2020-08-21 DIAGNOSIS — M869 Osteomyelitis, unspecified: Secondary | ICD-10-CM

## 2020-08-21 NOTE — Progress Notes (Signed)
Office Visit Note   Patient: David [name]           Date of Birth: 10-20-1945           MRN: [id number] Visit Date: 08/21/2020              Requested by: [name], MD [address] [place],  [place] [address] PCP: [name], MD  Chief Complaint  Patient presents with  . Left Foot - Follow-up      HPI: Patient presents today status post left foot fifth ray amputation.  He has been slow to heal.  At his last visit I gave him nitroglycerin patches and he was placed on Trental.  He has not been using the nitroglycerin patches.  He also has been not washing his foot daily.  Assessment & Plan: Visit Diagnoses: No diagnosis found.  Plan: Spoke with he and his wife.  He does have a pair of compression socks I think those would do the best to debride the wound and help with some of his venous stasis disease on his leg.  I went over with them that he should place a patch and I showed him where to do this I placed the patch today followed by the sock.  He should change his every 24 hours.  She wash the foot with Dial soap and water.  We will follow-up in 1 week.  Follow-Up Instructions: No follow-ups on file.   Ortho Exam  Patient is alert, oriented, no adenopathy, well-dressed, normal affect, normal respiratory effort. Examination demonstrates brawny skin changes consistent with venous stasis disease.  He does not have any open ulcers.  Swelling is fairly well controlled.  The foot he does have some superficial wound dehiscence.  Surgical sutures are placed.  No foul odor no ascending cellulitis  Imaging: No results found. No images are attached to the encounter.  Labs: Lab Results  Component Value Date   HGBA1C 6.2 (H) 08/03/2020   HGBA1C 7.1 (H) 05/28/2018   HGBA1C 6.3 (H) 12/19/2017   ESRSEDRATE 73 (H) 02/07/2015   ESRSEDRATE 22 (H) 09/15/2013   CRP 8.2 (H) 02/07/2015   LABURIC 4.4 08/12/2018   REPTSTATUS 08/16/2018 FINAL 08/15/2018   GRAMSTAIN   08/07/2018    MODERATE WBC PRESENT, PREDOMINANTLY PMN NO ORGANISMS SEEN    CULT MULTIPLE SPECIES PRESENT, SUGGEST RECOLLECTION (A) 08/15/2018     Lab Results  Component Value Date   ALBUMIN 3.7 09/10/2018   ALBUMIN 2.5 (L) 08/15/2018   ALBUMIN 2.4 (L) 08/14/2018    Lab Results  Component Value Date   MG 1.9 08/15/2018   MG 1.8 08/14/2018   MG 1.7 08/11/2018   No results found for: VD25OH  No results found for: PREALBUMIN CBC EXTENDED Latest Ref Rng & Units 08/03/2020 09/10/2018 08/15/2018  WBC 4.0 - 10.5 K/uL 8.0 6.3 19.5(H)  RBC 4.22 - 5.81 MIL/uL 2.70(L) 2.18(LL) 2.35(L)  HGB 13.0 - 17.0 g/dL 9.5(L) 8.0(L) 7.8(L)  HCT 39.0 - 52.0 % 30.3(L) 23.6(L) 25.2(L)  PLT 150 - 400 K/uL 232 206 315  NEUTROABS 1.7 - 7.7 K/uL - - 18.0(H)  LYMPHSABS 0.7 - 4.0 K/uL - - 0.9     There is no height or weight on file to calculate BMI.  Orders:  No orders of the defined types were placed in this encounter.  No orders of the defined types were placed in this encounter.    Procedures: No procedures performed  Clinical Data: No additional  findings.  ROS:  All other systems negative, except as noted in the HPI. Review of Systems  Objective: Vital Signs: There were no vitals taken for this visit.  Specialty Comments:  No specialty comments available.  PMFS History: Patient Active Problem List   Diagnosis Date Noted  . Abscess of left foot 08/03/2020  . Osteomyelitis of fifth toe of left foot ([facility])   . Mixed hyperlipidemia 10/15/2018  . Bilateral leg edema 09/17/2018  . Stage 3b chronic kidney disease ([place]) 09/10/2018  . Anemia 09/10/2018  . Pain and swelling of wrist, right 08/14/2018  . Bilateral carotid artery stenosis 07/30/2018  . Coronary artery disease involving native coronary artery of native heart without angina pectoris 07/30/2018  . Snoring 02/11/2018  . Chronic heart failure with preserved ejection fraction (HFpEF) ([place]) 12/22/2017  . At risk for adverse  drug reaction 12/15/2017  . Chronic pain syndrome 12/15/2017  . Status post coronary artery stent placement   . Multifocal pneumonia 03/30/2017  . Insulin-requiring or dependent type II diabetes mellitus ([place]) 03/29/2017  . Acute on chronic respiratory failure with hypoxia ([place])   . Pulmonary congestion   . Acquired contracture of Achilles tendon, right 08/19/2016  . Amputated great toe, right ([place]) 07/18/2016  . Onychomycosis 05/29/2016  . Diabetic polyneuropathy associated with type 2 diabetes mellitus ([facility]) 04/09/2016  . Right foot ulcer, limited to breakdown of skin ([place]) 04/09/2016  . Unspecified hereditary and idiopathic peripheral neuropathy 09/15/2013  . Malaise 08/14/2013  . Essential hypertension 08/14/2013  . Chronic back pain 08/14/2013  . Glaucoma 08/14/2013  . Headache 08/14/2013   Past Medical History:  Diagnosis Date  . Arthritis   . CAD in native artery    s/p stent in 11/18  . CHF (congestive heart failure) ([place])    normal echo in 11/18  . CKD (chronic kidney disease)   . DDD (degenerative disc disease), cervical   . Diabetes mellitus with complication (HCC)    Type II  . Diabetic neuropathy ([place])   . Diabetic retinopathy ([place])   . GERD (gastroesophageal reflux disease)   . Glaucoma   . Hypertension     Family History  Problem Relation Age of Onset  . Diabetes Other   . Hyperlipidemia Other   . Hypertension Other   . Stroke Other   . Alzheimer's disease Other   . Thyroid disease Mother   . Diabetes Mellitus II Father   . Alzheimer's disease Father     Past Surgical History:  Procedure Laterality Date  . AMPUTATION Right 07/09/2016   Procedure: Right Great Toe Amputation at Metatarsophalangeal Joint;  Surgeon: [name], MD;  Location: [facility];  Service: Orthopedics;  Laterality: Right;  . AMPUTATION Right 05/28/2018   Procedure: RIGHT SECOND TOE AMPUTATION;  Surgeon: [name], MD;  Location: [facility];  Service: Orthopedics;  Laterality: Right;   . AMPUTATION Left 08/03/2020   Procedure: LEFT 5TH RAY AMPUTATION;  Surgeon: [name], MD;  Location: [facility];  Service: Orthopedics;  Laterality: Left;  . BACK SURGERY     4  . CARDIAC CATHETERIZATION    . CORONARY STENT INTERVENTION N/A 04/06/2017   Procedure: CORONARY STENT INTERVENTION;  Surgeon: Nigel Mormon, MD;  Location: [facility];  Service: Cardiovascular;  Laterality: N/A;  . CORONARY STENT INTERVENTION N/A 04/07/2017   Procedure: CORONARY STENT INTERVENTION;  Surgeon: Nigel Mormon, MD;  Location: [facility];  Service: Cardiovascular;  Laterality: N/A;  . CYST  EXCISION     on Back  . ENDOTRACHEAL INTUBATION EMERGENT  08/07/2018      . JOINT REPLACEMENT    . LAPAROSCOPIC ABDOMINAL EXPLORATION N/A 11/26/2017   Procedure: LAPAROSCOPIC ABDOMINAL EXPLORATION, DRAINAGE OF APPENDICEAL ABCESS. PLACEMENT OF DRAIN;  Surgeon: Alphonsa Overall, MD;  Location: [facility];  Service: General;  Laterality: N/A;  . RIGHT/LEFT HEART CATH AND CORONARY ANGIOGRAPHY N/A 04/06/2017   Procedure: RIGHT/LEFT HEART CATH AND CORONARY ANGIOGRAPHY;  Surgeon: Nigel Mormon, MD;  Location: [facility];  Service: Cardiovascular;  Laterality: N/A;  . TOE SURGERY  05/2018   Left   . TOTAL HIP ARTHROPLASTY     Right    Social History   Occupational History  . Occupation: Retired  Tobacco Use  . Smoking status: Former Smoker    Packs/day: 0.25    Years: 5.00    Pack years: 1.25    Types: Cigarettes    Quit date: 1996    Years since quitting: 26.2  . Smokeless tobacco: Never Used  Vaping Use  . Vaping Use: Never used  Substance and Sexual Activity  . Alcohol use: Yes    Comment: occ  . Drug use: No  . Sexual activity: Yes

## 2020-08-28 ENCOUNTER — Encounter: Payer: Self-pay | Admitting: Orthopedic Surgery

## 2020-08-28 ENCOUNTER — Ambulatory Visit (INDEPENDENT_AMBULATORY_CARE_PROVIDER_SITE_OTHER): Payer: Medicare Other | Admitting: Physician Assistant

## 2020-08-28 DIAGNOSIS — M869 Osteomyelitis, unspecified: Secondary | ICD-10-CM

## 2020-08-28 NOTE — Progress Notes (Signed)
Office Visit Note   Patient: David [name]           Date of Birth: April 06, 1946           MRN: [id number] Visit Date: 08/28/2020              Requested by: [name], MD [address] [place],  [place] [address] PCP: [name], MD  Chief Complaint  Patient presents with  . Left Foot - Routine Post Op    08/03/20 lett 5th ray amputation       HPI: Patient is a pleasant 75 year old gentleman who is now 4 weeks status post left fifth ray amputation.  At his last visit we discussed changing the sock daily as well as the nitroglycerin patch.  He will not allow his wife to change the patch or the sock.  He comes in today wearing the same sock that I placed on him a week ago and the same nitroglycerin patch he is also currently taking Trental and he does acknowledge that he is elevating his leg  Assessment & Plan: Visit Diagnoses: No diagnosis found.  Plan: Patient was also seen today by [name].  He is to change the socks daily and cleanse his foot.  He is to change the nitroglycerin patch daily placing it in a different spot.  He understands if he does not do this he is at a high risk for further amputation.  Should also use protein supplement vitamin C and vitamin D3  Follow-Up Instructions: No follow-ups on file.   Ortho Exam  Patient is alert, oriented, no adenopathy, well-dressed, normal affect, normal respiratory effort. Examination he has a palpable dorsalis pedis pulse.  He has wound dehiscence over the lateral aspect of the foot with a ray amputation took place.  There is some necrosis.  Mild foul odor no purulent drainage or fluctuance nitroglycerin patch from 1 week ago was removed with no skin breakdown but moisture beneath.  New socks and nitroglycerin patch were placed today  Imaging: No results found. No images are attached to the encounter.  Labs: Lab Results  Component Value Date   HGBA1C 6.2 (H) 08/03/2020   HGBA1C 7.1 (H) 05/28/2018    HGBA1C 6.3 (H) 12/19/2017   ESRSEDRATE 73 (H) 02/07/2015   ESRSEDRATE 22 (H) 09/15/2013   CRP 8.2 (H) 02/07/2015   LABURIC 4.4 08/12/2018   REPTSTATUS 08/16/2018 FINAL 08/15/2018   GRAMSTAIN  08/07/2018    MODERATE WBC PRESENT, PREDOMINANTLY PMN NO ORGANISMS SEEN    CULT MULTIPLE SPECIES PRESENT, SUGGEST RECOLLECTION (A) 08/15/2018     Lab Results  Component Value Date   ALBUMIN 3.7 09/10/2018   ALBUMIN 2.5 (L) 08/15/2018   ALBUMIN 2.4 (L) 08/14/2018    Lab Results  Component Value Date   MG 1.9 08/15/2018   MG 1.8 08/14/2018   MG 1.7 08/11/2018   No results found for: VD25OH  No results found for: PREALBUMIN CBC EXTENDED Latest Ref Rng & Units 08/03/2020 09/10/2018 08/15/2018  WBC 4.0 - 10.5 K/uL 8.0 6.3 19.5(H)  RBC 4.22 - 5.81 MIL/uL 2.70(L) 2.18(LL) 2.35(L)  HGB 13.0 - 17.0 g/dL 9.5(L) 8.0(L) 7.8(L)  HCT 39.0 - 52.0 % 30.3(L) 23.6(L) 25.2(L)  PLT 150 - 400 K/uL 232 206 315  NEUTROABS 1.7 - 7.7 K/uL - - 18.0(H)  LYMPHSABS 0.7 - 4.0 K/uL - - 0.9     There is no height or weight on file to calculate BMI.  Orders:  No orders of the defined types were placed in this encounter.  No orders of the defined types were placed in this encounter.    Procedures: No procedures performed  Clinical Data: No additional findings.  ROS:  All other systems negative, except as noted in the HPI. Review of Systems  Objective: Vital Signs: There were no vitals taken for this visit.  Specialty Comments:  No specialty comments available.  PMFS History: Patient Active Problem List   Diagnosis Date Noted  . Abscess of left foot 08/03/2020  . Osteomyelitis of fifth toe of left foot ([facility])   . Mixed hyperlipidemia 10/15/2018  . Bilateral leg edema 09/17/2018  . Stage 3b chronic kidney disease ([facility] [facility]) 09/10/2018  . Anemia 09/10/2018  . Pain and swelling of wrist, right 08/14/2018  . Bilateral carotid artery stenosis 07/30/2018  . Coronary artery disease involving native  coronary artery of native heart without angina pectoris 07/30/2018  . Snoring 02/11/2018  . Chronic heart failure with preserved ejection fraction (HFpEF) ([facility]) 12/22/2017  . At risk for adverse drug reaction 12/15/2017  . Chronic pain syndrome 12/15/2017  . Status post coronary artery stent placement   . Multifocal pneumonia 03/30/2017  . Insulin-requiring or dependent type II diabetes mellitus ([facility]) 03/29/2017  . Acute on chronic respiratory failure with hypoxia ([facility])   . Pulmonary congestion   . Acquired contracture of Achilles tendon, right 08/19/2016  . Amputated great toe, right ([place]) 07/18/2016  . Onychomycosis 05/29/2016  . Diabetic polyneuropathy associated with type 2 diabetes mellitus ([place]) 04/09/2016  . Right foot ulcer, limited to breakdown of skin ([place]) 04/09/2016  . Unspecified hereditary and idiopathic peripheral neuropathy 09/15/2013  . Malaise 08/14/2013  . Essential hypertension 08/14/2013  . Chronic back pain 08/14/2013  . Glaucoma 08/14/2013  . Headache 08/14/2013   Past Medical History:  Diagnosis Date  . Arthritis   . CAD in native artery    s/p stent in 11/18  . CHF (congestive heart failure) ([facility])    normal echo in 11/18  . CKD (chronic kidney disease)   . DDD (degenerative disc disease), cervical   . Diabetes mellitus with complication (HCC)    Type II  . Diabetic neuropathy ([facility])   . Diabetic retinopathy ([place])   . GERD (gastroesophageal reflux disease)   . Glaucoma   . Hypertension     Family History  Problem Relation Age of Onset  . Diabetes Other   . Hyperlipidemia Other   . Hypertension Other   . Stroke Other   . Alzheimer's disease Other   . Thyroid disease Mother   . Diabetes Mellitus II Father   . Alzheimer's disease Father     Past Surgical History:  Procedure Laterality Date  . AMPUTATION Right 07/09/2016   Procedure: Right Great Toe Amputation at Metatarsophalangeal Joint;  Surgeon: [name], MD;  Location: [facility];  Service:  Orthopedics;  Laterality: Right;  . AMPUTATION Right 05/28/2018   Procedure: RIGHT SECOND TOE AMPUTATION;  Surgeon: [name], MD;  Location: [facility];  Service: Orthopedics;  Laterality: Right;  . AMPUTATION Left 08/03/2020   Procedure: LEFT 5TH RAY AMPUTATION;  Surgeon: [name], MD;  Location: [facility];  Service: Orthopedics;  Laterality: Left;  . BACK SURGERY     4  . CARDIAC CATHETERIZATION    . CORONARY STENT INTERVENTION N/A 04/06/2017   Procedure: CORONARY STENT INTERVENTION;  Surgeon: Nigel Mormon, MD;  Location: [facility];  Service: Cardiovascular;  Laterality:  N/A;  . CORONARY STENT INTERVENTION N/A 04/07/2017   Procedure: CORONARY STENT INTERVENTION;  Surgeon: Nigel Mormon, MD;  Location: [facility];  Service: Cardiovascular;  Laterality: N/A;  . CYST EXCISION     on Back  . ENDOTRACHEAL INTUBATION EMERGENT  08/07/2018      . JOINT REPLACEMENT    . LAPAROSCOPIC ABDOMINAL EXPLORATION N/A 11/26/2017   Procedure: LAPAROSCOPIC ABDOMINAL EXPLORATION, DRAINAGE OF APPENDICEAL ABCESS. PLACEMENT OF DRAIN;  Surgeon: Alphonsa Overall, MD;  Location: [facility];  Service: General;  Laterality: N/A;  . RIGHT/LEFT HEART CATH AND CORONARY ANGIOGRAPHY N/A 04/06/2017   Procedure: RIGHT/LEFT HEART CATH AND CORONARY ANGIOGRAPHY;  Surgeon: Nigel Mormon, MD;  Location: [facility];  Service: Cardiovascular;  Laterality: N/A;  . TOE SURGERY  05/2018   Left   . TOTAL HIP ARTHROPLASTY     Right    Social History   Occupational History  . Occupation: Retired  Tobacco Use  . Smoking status: Former Smoker    Packs/day: 0.25    Years: 5.00    Pack years: 1.25    Types: Cigarettes    Quit date: 1996    Years since quitting: 26.2  . Smokeless tobacco: Never Used  Vaping Use  . Vaping Use: Never used  Substance and Sexual Activity  . Alcohol use: Yes    Comment: occ  . Drug use: No  . Sexual activity: Yes

## 2020-09-04 ENCOUNTER — Ambulatory Visit: Payer: Medicare Other | Admitting: Orthopedic Surgery

## 2020-09-11 ENCOUNTER — Ambulatory Visit (INDEPENDENT_AMBULATORY_CARE_PROVIDER_SITE_OTHER): Payer: Medicare Other | Admitting: Physician Assistant

## 2020-09-11 ENCOUNTER — Encounter: Payer: Self-pay | Admitting: Orthopedic Surgery

## 2020-09-11 DIAGNOSIS — M869 Osteomyelitis, unspecified: Secondary | ICD-10-CM

## 2020-09-11 NOTE — Progress Notes (Signed)
Office Visit Note   Patient: David [name]           Date of Birth: 01/17/1946           MRN: [id number] Visit Date: 09/11/2020              Requested by: [name], MD [address] [place],  [place] [id number] PCP: [name], MD  Chief Complaint  Patient presents with  . Left Foot - Follow-up      HPI: Patient is little over a month status post left fifth ray amputation.  He is also status post right great toe and second toe amputation.  He is doing well.  He has been changing his socks more frequently and replacing the nitroglycerin patch  Assessment & Plan: Visit Diagnoses: No diagnosis found.   Plan: Improvement from 2 weeks continue current plan I did furnish them a prescription to work with [employer] for orthotics and extra-depth shoes follow-up in 2 weeks  Follow-Up Instructions: No follow-ups on file.   Ortho Exam  Patient is alert, oriented, no adenopathy, well-dressed, normal affect, normal respiratory effort. Left foot wound is healing better he does have some skin maceration and 1 small area of dehiscence this does not probe deeply there is no surrounding cellulitis no foul odor no drainage  Imaging: No results found. No images are attached to the encounter.  Labs: Lab Results  Component Value Date   HGBA1C 6.2 (H) 08/03/2020   HGBA1C 7.1 (H) 05/28/2018   HGBA1C 6.3 (H) 12/19/2017   ESRSEDRATE 73 (H) 02/07/2015   ESRSEDRATE 22 (H) 09/15/2013   CRP 8.2 (H) 02/07/2015   LABURIC 4.4 08/12/2018   REPTSTATUS 08/16/2018 FINAL 08/15/2018   GRAMSTAIN  08/07/2018    MODERATE WBC PRESENT, PREDOMINANTLY PMN NO ORGANISMS SEEN    CULT MULTIPLE SPECIES PRESENT, SUGGEST RECOLLECTION (A) 08/15/2018     Lab Results  Component Value Date   ALBUMIN 3.7 09/10/2018   ALBUMIN 2.5 (L) 08/15/2018   ALBUMIN 2.4 (L) 08/14/2018    Lab Results  Component Value Date   MG 1.9 08/15/2018   MG 1.8 08/14/2018   MG 1.7 08/11/2018   No results  found for: VD25OH  No results found for: PREALBUMIN CBC EXTENDED Latest Ref Rng & Units 08/03/2020 09/10/2018 08/15/2018  WBC 4.0 - 10.5 K/uL 8.0 6.3 19.5(H)  RBC 4.22 - 5.81 MIL/uL 2.70(L) 2.18(LL) 2.35(L)  HGB 13.0 - 17.0 g/dL 9.5(L) 8.0(L) 7.8(L)  HCT 39.0 - 52.0 % 30.3(L) 23.6(L) 25.2(L)  PLT 150 - 400 K/uL 232 206 315  NEUTROABS 1.7 - 7.7 K/uL - - 18.0(H)  LYMPHSABS 0.7 - 4.0 K/uL - - 0.9     There is no height or weight on file to calculate BMI.  Orders:  No orders of the defined types were placed in this encounter.  No orders of the defined types were placed in this encounter.    Procedures: No procedures performed  Clinical Data: No additional findings.  ROS:  All other systems negative, except as noted in the HPI. Review of Systems  Objective: Vital Signs: There were no vitals taken for this visit.  Specialty Comments:  No specialty comments available.  PMFS History: Patient Active Problem List   Diagnosis Date Noted  . Abscess of left foot 08/03/2020  . Osteomyelitis of fifth toe of left foot ([place])   . Mixed hyperlipidemia 10/15/2018  . Bilateral leg edema 09/17/2018  . Stage 3b chronic kidney disease ([place])  09/10/2018  . Anemia 09/10/2018  . Pain and swelling of wrist, right 08/14/2018  . Bilateral carotid artery stenosis 07/30/2018  . Coronary artery disease involving native coronary artery of native heart without angina pectoris 07/30/2018  . Snoring 02/11/2018  . Chronic heart failure with preserved ejection fraction (HFpEF) ([place]) 12/22/2017  . At risk for adverse drug reaction 12/15/2017  . Chronic pain syndrome 12/15/2017  . Status post coronary artery stent placement   . Multifocal pneumonia 03/30/2017  . Insulin-requiring or dependent type II diabetes mellitus ([place]) 03/29/2017  . Acute on chronic respiratory failure with hypoxia ([place])   . Pulmonary congestion   . Acquired contracture of Achilles tendon, right 08/19/2016  . Amputated great  toe, right ([place]) 07/18/2016  . Onychomycosis 05/29/2016  . Diabetic polyneuropathy associated with type 2 diabetes mellitus ([place]) 04/09/2016  . Right foot ulcer, limited to breakdown of skin ([place]) 04/09/2016  . Unspecified hereditary and idiopathic peripheral neuropathy 09/15/2013  . Malaise 08/14/2013  . Essential hypertension 08/14/2013  . Chronic back pain 08/14/2013  . Glaucoma 08/14/2013  . Headache 08/14/2013   Past Medical History:  Diagnosis Date  . Arthritis   . CAD in native artery    s/p stent in 11/18  . CHF (congestive heart failure) ([place])    normal echo in 11/18  . CKD (chronic kidney disease)   . DDD (degenerative disc disease), cervical   . Diabetes mellitus with complication (HCC)    Type II  . Diabetic neuropathy ([place])   . Diabetic retinopathy ([place])   . GERD (gastroesophageal reflux disease)   . Glaucoma   . Hypertension     Family History  Problem Relation Age of Onset  . Diabetes Other   . Hyperlipidemia Other   . Hypertension Other   . Stroke Other   . Alzheimer's disease Other   . Thyroid disease Mother   . Diabetes Mellitus II Father   . Alzheimer's disease Father     Past Surgical History:  Procedure Laterality Date  . AMPUTATION Right 07/09/2016   Procedure: Right Great Toe Amputation at Metatarsophalangeal Joint;  Surgeon: [name], MD;  Location: [facility];  Service: Orthopedics;  Laterality: Right;  . AMPUTATION Right 05/28/2018   Procedure: RIGHT SECOND TOE AMPUTATION;  Surgeon: [name], MD;  Location: [facility];  Service: Orthopedics;  Laterality: Right;  . AMPUTATION Left 08/03/2020   Procedure: LEFT 5TH RAY AMPUTATION;  Surgeon: [name], MD;  Location: [facility];  Service: Orthopedics;  Laterality: Left;  . BACK SURGERY     4  . CARDIAC CATHETERIZATION    . CORONARY STENT INTERVENTION N/A 04/06/2017   Procedure: CORONARY STENT INTERVENTION;  Surgeon: Nigel Mormon, MD;  Location: [facility];  Service:  Cardiovascular;  Laterality: N/A;  . CORONARY STENT INTERVENTION N/A 04/07/2017   Procedure: CORONARY STENT INTERVENTION;  Surgeon: Nigel Mormon, MD;  Location: [facility];  Service: Cardiovascular;  Laterality: N/A;  . CYST EXCISION     on Back  . ENDOTRACHEAL INTUBATION EMERGENT  08/07/2018      . JOINT REPLACEMENT    . LAPAROSCOPIC ABDOMINAL EXPLORATION N/A 11/26/2017   Procedure: LAPAROSCOPIC ABDOMINAL EXPLORATION, DRAINAGE OF APPENDICEAL ABCESS. PLACEMENT OF DRAIN;  Surgeon: Alphonsa Overall, MD;  Location: [facility];  Service: General;  Laterality: N/A;  . RIGHT/LEFT HEART CATH AND CORONARY ANGIOGRAPHY N/A 04/06/2017   Procedure: RIGHT/LEFT HEART CATH AND CORONARY ANGIOGRAPHY;  Surgeon: Nigel Mormon, MD;  Location:  [facility];  Service: Cardiovascular;  Laterality: N/A;  . TOE SURGERY  05/2018   Left   . TOTAL HIP ARTHROPLASTY     Right    Social History   Occupational History  . Occupation: Retired  Tobacco Use  . Smoking status: Former Smoker    Packs/day: 0.25    Years: 5.00    Pack years: 1.25    Types: Cigarettes    Quit date: 1996    Years since quitting: 26.3  . Smokeless tobacco: Never Used  Vaping Use  . Vaping Use: Never used  Substance and Sexual Activity  . Alcohol use: Yes    Comment: occ  . Drug use: No  . Sexual activity: Yes

## 2020-09-14 ENCOUNTER — Other Ambulatory Visit: Payer: Self-pay | Admitting: Cardiology

## 2020-09-25 ENCOUNTER — Ambulatory Visit (INDEPENDENT_AMBULATORY_CARE_PROVIDER_SITE_OTHER): Payer: Medicare Other | Admitting: Physician Assistant

## 2020-09-25 ENCOUNTER — Encounter: Payer: Self-pay | Admitting: Orthopedic Surgery

## 2020-09-25 DIAGNOSIS — M869 Osteomyelitis, unspecified: Secondary | ICD-10-CM

## 2020-09-25 NOTE — Progress Notes (Signed)
Office Visit Note   Patient: David [name]           Date of Birth: July 14, 1945           MRN: [id number] Visit Date: 09/25/2020              Requested by: [name], MD [address] [place],  [place] [address] PCP: [name], MD  Chief Complaint  Patient presents with  . Left Foot - Follow-up      HPI: Patient is a pleasant 75 year old gentleman who follows up today 2 months status post left fifth ray amputation.  He had been slow to heal this and was using Trental and a nitroglycerin patch he also has been compliant in wearing his vive medical compression stockings i  Assessment & Plan: Visit Diagnoses: No diagnosis found.  Plan: Continue with stockings he will be getting orthotics.  We will follow-up for final visit in 1 month.  He may discontinue the nitroglycerin and Trental  Follow-Up Instructions: No follow-ups on file.   Ortho Exam  Patient is alert, oriented, no adenopathy, well-dressed, normal affect, normal respiratory effort. Examination wound is now well-healed there is no drainage.  He just has some very thin eschar.  No swelling no ascending cellulitis.  He does have lower extremity skin changes consistent with venous insufficiency no signs of infection  Imaging: No results found. No images are attached to the encounter.  Labs: Lab Results  Component Value Date   HGBA1C 6.2 (H) 08/03/2020   HGBA1C 7.1 (H) 05/28/2018   HGBA1C 6.3 (H) 12/19/2017   ESRSEDRATE 73 (H) 02/07/2015   ESRSEDRATE 22 (H) 09/15/2013   CRP 8.2 (H) 02/07/2015   LABURIC 4.4 08/12/2018   REPTSTATUS 08/16/2018 FINAL 08/15/2018   GRAMSTAIN  08/07/2018    MODERATE WBC PRESENT, PREDOMINANTLY PMN NO ORGANISMS SEEN    CULT MULTIPLE SPECIES PRESENT, SUGGEST RECOLLECTION (A) 08/15/2018     Lab Results  Component Value Date   ALBUMIN 3.7 09/10/2018   ALBUMIN 2.5 (L) 08/15/2018   ALBUMIN 2.4 (L) 08/14/2018    Lab Results  Component Value Date   MG 1.9  08/15/2018   MG 1.8 08/14/2018   MG 1.7 08/11/2018   No results found for: VD25OH  No results found for: PREALBUMIN CBC EXTENDED Latest Ref Rng & Units 08/03/2020 09/10/2018 08/15/2018  WBC 4.0 - 10.5 K/uL 8.0 6.3 19.5(H)  RBC 4.22 - 5.81 MIL/uL 2.70(L) 2.18(LL) 2.35(L)  HGB 13.0 - 17.0 g/dL 9.5(L) 8.0(L) 7.8(L)  HCT 39.0 - 52.0 % 30.3(L) 23.6(L) 25.2(L)  PLT 150 - 400 K/uL 232 206 315  NEUTROABS 1.7 - 7.7 K/uL - - 18.0(H)  LYMPHSABS 0.7 - 4.0 K/uL - - 0.9     There is no height or weight on file to calculate BMI.  Orders:  No orders of the defined types were placed in this encounter.  No orders of the defined types were placed in this encounter.    Procedures: No procedures performed  Clinical Data: No additional findings.  ROS:  All other systems negative, except as noted in the HPI. Review of Systems  Objective: Vital Signs: There were no vitals taken for this visit.  Specialty Comments:  No specialty comments available.  PMFS History: Patient Active Problem List   Diagnosis Date Noted  . Abscess of left foot 08/03/2020  . Osteomyelitis of fifth toe of left foot ([place])   . Mixed hyperlipidemia 10/15/2018  . Bilateral leg edema 09/17/2018  .  Stage 3b chronic kidney disease ([place]) 09/10/2018  . Anemia 09/10/2018  . Pain and swelling of wrist, right 08/14/2018  . Bilateral carotid artery stenosis 07/30/2018  . Coronary artery disease involving native coronary artery of native heart without angina pectoris 07/30/2018  . Snoring 02/11/2018  . Chronic heart failure with preserved ejection fraction (HFpEF) ([place]) 12/22/2017  . At risk for adverse drug reaction 12/15/2017  . Chronic pain syndrome 12/15/2017  . Status post coronary artery stent placement   . Multifocal pneumonia 03/30/2017  . Insulin-requiring or dependent type II diabetes mellitus ([place]) 03/29/2017  . Acute on chronic respiratory failure with hypoxia ([place])   . Pulmonary congestion   . Acquired  contracture of Achilles tendon, right 08/19/2016  . Amputated great toe, right ([place]) 07/18/2016  . Onychomycosis 05/29/2016  . Diabetic polyneuropathy associated with type 2 diabetes mellitus ([place]) 04/09/2016  . Right foot ulcer, limited to breakdown of skin ([place]) 04/09/2016  . Unspecified hereditary and idiopathic peripheral neuropathy 09/15/2013  . Malaise 08/14/2013  . Essential hypertension 08/14/2013  . Chronic back pain 08/14/2013  . Glaucoma 08/14/2013  . Headache 08/14/2013   Past Medical History:  Diagnosis Date  . Arthritis   . CAD in native artery    s/p stent in 11/18  . CHF (congestive heart failure) ([place])    normal echo in 11/18  . CKD (chronic kidney disease)   . DDD (degenerative disc disease), cervical   . Diabetes mellitus with complication (HCC)    Type II  . Diabetic neuropathy ([place])   . Diabetic retinopathy ([place])   . GERD (gastroesophageal reflux disease)   . Glaucoma   . Hypertension     Family History  Problem Relation Age of Onset  . Diabetes Other   . Hyperlipidemia Other   . Hypertension Other   . Stroke Other   . Alzheimer's disease Other   . Thyroid disease Mother   . Diabetes Mellitus II Father   . Alzheimer's disease Father     Past Surgical History:  Procedure Laterality Date  . AMPUTATION Right 07/09/2016   Procedure: Right Great Toe Amputation at Metatarsophalangeal Joint;  Surgeon: [name], MD;  Location: [facility];  Service: Orthopedics;  Laterality: Right;  . AMPUTATION Right 05/28/2018   Procedure: RIGHT SECOND TOE AMPUTATION;  Surgeon: [name], MD;  Location: [facility];  Service: Orthopedics;  Laterality: Right;  . AMPUTATION Left 08/03/2020   Procedure: LEFT 5TH RAY AMPUTATION;  Surgeon: [name], MD;  Location: [facility];  Service: Orthopedics;  Laterality: Left;  . BACK SURGERY     4  . CARDIAC CATHETERIZATION    . CORONARY STENT INTERVENTION N/A 04/06/2017   Procedure: CORONARY STENT INTERVENTION;  Surgeon: Nigel Mormon, MD;  Location: [facility];  Service: Cardiovascular;  Laterality: N/A;  . CORONARY STENT INTERVENTION N/A 04/07/2017   Procedure: CORONARY STENT INTERVENTION;  Surgeon: Nigel Mormon, MD;  Location: [facility];  Service: Cardiovascular;  Laterality: N/A;  . CYST EXCISION     on Back  . ENDOTRACHEAL INTUBATION EMERGENT  08/07/2018      . JOINT REPLACEMENT    . LAPAROSCOPIC ABDOMINAL EXPLORATION N/A 11/26/2017   Procedure: LAPAROSCOPIC ABDOMINAL EXPLORATION, DRAINAGE OF APPENDICEAL ABCESS. PLACEMENT OF DRAIN;  Surgeon: Alphonsa Overall, MD;  Location: [facility];  Service: General;  Laterality: N/A;  . RIGHT/LEFT HEART CATH AND CORONARY ANGIOGRAPHY N/A 04/06/2017   Procedure: RIGHT/LEFT HEART CATH AND CORONARY ANGIOGRAPHY;  Surgeon:  Patwardhan, Reynold Bowen, MD;  Location: [facility];  Service: Cardiovascular;  Laterality: N/A;  . TOE SURGERY  05/2018   Left   . TOTAL HIP ARTHROPLASTY     Right    Social History   Occupational History  . Occupation: Retired  Tobacco Use  . Smoking status: Former Smoker    Packs/day: 0.25    Years: 5.00    Pack years: 1.25    Types: Cigarettes    Quit date: 1996    Years since quitting: 26.3  . Smokeless tobacco: Never Used  Vaping Use  . Vaping Use: Never used  Substance and Sexual Activity  . Alcohol use: Yes    Comment: occ  . Drug use: No  . Sexual activity: Yes

## 2020-10-23 ENCOUNTER — Encounter: Payer: Self-pay | Admitting: Orthopedic Surgery

## 2020-10-23 ENCOUNTER — Ambulatory Visit (INDEPENDENT_AMBULATORY_CARE_PROVIDER_SITE_OTHER): Payer: Medicare Other | Admitting: Physician Assistant

## 2020-10-23 DIAGNOSIS — R600 Localized edema: Secondary | ICD-10-CM | POA: Diagnosis not present

## 2020-10-23 NOTE — Progress Notes (Signed)
Office Visit Note   Patient: David [name]           Date of Birth: [birth year]           MRN: [id number] Visit Date: 10/23/2020              Requested by: [name], MD [address] [place],  [place] [address] PCP: [name], MD  Chief Complaint  Patient presents with  . Left Foot - Follow-up      HPI: Patient is a pleasant 75 year old gentleman who follows up today for his bilateral chronic venous insufficiency.  He has been wearing his medical compression stockings.  He is also status post great toe amputation on the right.  Overall he is doing well.  He has had a little bit more swelling but had not taken his diuretic  Assessment & Plan: Visit Diagnoses: No diagnosis found.  Plan: Continue with medical compression stockings emphasized the importance of Achilles stretching especially on the right follow-up in 1 month.  Follow-Up Instructions: No follow-ups on file.   Ortho Exam  Patient is alert, oriented, no adenopathy, well-dressed, normal affect, normal respiratory effort. Bilateral lower extremities have brawny skin changes but swelling is well controlled with his socks.  No open ulcers he does have some scaling of his skin.  He is status post right great toe amputation.  He still has stiffness in the Achilles he has chronic clawing of the lesser toes with a small abrasion on one of his toes secondary to tight Achilles.  No ulcers on the plantar surface of his foot no signs of infection  Imaging: No results found. No images are attached to the encounter.  Labs: Lab Results  Component Value Date   HGBA1C 6.2 (H) 08/03/2020   HGBA1C 7.1 (H) 05/28/2018   HGBA1C 6.3 (H) 12/19/2017   ESRSEDRATE 73 (H) 02/07/2015   ESRSEDRATE 22 (H) 09/15/2013   CRP 8.2 (H) 02/07/2015   LABURIC 4.4 08/12/2018   REPTSTATUS 08/16/2018 FINAL 08/15/2018   GRAMSTAIN  08/07/2018    MODERATE WBC PRESENT, PREDOMINANTLY PMN NO ORGANISMS SEEN    CULT MULTIPLE  SPECIES PRESENT, SUGGEST RECOLLECTION (A) 08/15/2018     Lab Results  Component Value Date   ALBUMIN 3.7 09/10/2018   ALBUMIN 2.5 (L) 08/15/2018   ALBUMIN 2.4 (L) 08/14/2018    Lab Results  Component Value Date   MG 1.9 08/15/2018   MG 1.8 08/14/2018   MG 1.7 08/11/2018   No results found for: VD25OH  No results found for: PREALBUMIN CBC EXTENDED Latest Ref Rng & Units 08/03/2020 09/10/2018 08/15/2018  WBC 4.0 - 10.5 K/uL 8.0 6.3 19.5(H)  RBC 4.22 - 5.81 MIL/uL 2.70(L) 2.18(LL) 2.35(L)  HGB 13.0 - 17.0 g/dL 9.5(L) 8.0(L) 7.8(L)  HCT 39.0 - 52.0 % 30.3(L) 23.6(L) 25.2(L)  PLT 150 - 400 K/uL 232 206 315  NEUTROABS 1.7 - 7.7 K/uL - - 18.0(H)  LYMPHSABS 0.7 - 4.0 K/uL - - 0.9     There is no height or weight on file to calculate BMI.  Orders:  No orders of the defined types were placed in this encounter.  No orders of the defined types were placed in this encounter.    Procedures: No procedures performed  Clinical Data: No additional findings.  ROS:  All other systems negative, except as noted in the HPI. Review of Systems  Objective: Vital Signs: There were no vitals taken for this visit.  Specialty Comments:  No  specialty comments available.  PMFS History: Patient Active Problem List   Diagnosis Date Noted  . Abscess of left foot 08/03/2020  . Osteomyelitis of fifth toe of left foot ([place])   . Mixed hyperlipidemia 10/15/2018  . Bilateral leg edema 09/17/2018  . Stage 3b chronic kidney disease ([place]) 09/10/2018  . Anemia 09/10/2018  . Pain and swelling of wrist, right 08/14/2018  . Bilateral carotid artery stenosis 07/30/2018  . Coronary artery disease involving native coronary artery of native heart without angina pectoris 07/30/2018  . Snoring 02/11/2018  . Chronic heart failure with preserved ejection fraction (HFpEF) ([place]) 12/22/2017  . At risk for adverse drug reaction 12/15/2017  . Chronic pain syndrome 12/15/2017  . Status post coronary artery  stent placement   . Multifocal pneumonia 03/30/2017  . Insulin-requiring or dependent type II diabetes mellitus ([place]) 03/29/2017  . Acute on chronic respiratory failure with hypoxia ([place])   . Pulmonary congestion   . Acquired contracture of Achilles tendon, right 08/19/2016  . Amputated great toe, right ([place]) 07/18/2016  . Onychomycosis 05/29/2016  . Diabetic polyneuropathy associated with type 2 diabetes mellitus ([place]) 04/09/2016  . Right foot ulcer, limited to breakdown of skin ([place]) 04/09/2016  . Unspecified hereditary and idiopathic peripheral neuropathy 09/15/2013  . Malaise 08/14/2013  . Essential hypertension 08/14/2013  . Chronic back pain 08/14/2013  . Glaucoma 08/14/2013  . Headache 08/14/2013   Past Medical History:  Diagnosis Date  . Arthritis   . CAD in native artery    s/p stent in 11/18  . CHF (congestive heart failure) ([place])    normal echo in 11/18  . CKD (chronic kidney disease)   . DDD (degenerative disc disease), cervical   . Diabetes mellitus with complication (HCC)    Type II  . Diabetic neuropathy ([place])   . Diabetic retinopathy ([place])   . GERD (gastroesophageal reflux disease)   . Glaucoma   . Hypertension     Family History  Problem Relation Age of Onset  . Diabetes Other   . Hyperlipidemia Other   . Hypertension Other   . Stroke Other   . Alzheimer's disease Other   . Thyroid disease Mother   . Diabetes Mellitus II Father   . Alzheimer's disease Father     Past Surgical History:  Procedure Laterality Date  . AMPUTATION Right 07/09/2016   Procedure: Right Great Toe Amputation at Metatarsophalangeal Joint;  Surgeon: [name], MD;  Location: [facility];  Service: Orthopedics;  Laterality: Right;  . AMPUTATION Right 05/28/2018   Procedure: RIGHT SECOND TOE AMPUTATION;  Surgeon: [name], MD;  Location: [facility];  Service: Orthopedics;  Laterality: Right;  . AMPUTATION Left 08/03/2020   Procedure: LEFT 5TH RAY AMPUTATION;  Surgeon: [name],  MD;  Location: [facility];  Service: Orthopedics;  Laterality: Left;  . BACK SURGERY     4  . CARDIAC CATHETERIZATION    . CORONARY STENT INTERVENTION N/A 04/06/2017   Procedure: CORONARY STENT INTERVENTION;  Surgeon: Nigel Mormon, MD;  Location: [facility];  Service: Cardiovascular;  Laterality: N/A;  . CORONARY STENT INTERVENTION N/A 04/07/2017   Procedure: CORONARY STENT INTERVENTION;  Surgeon: Nigel Mormon, MD;  Location: [facility];  Service: Cardiovascular;  Laterality: N/A;  . CYST EXCISION     on Back  . ENDOTRACHEAL INTUBATION EMERGENT  08/07/2018      . JOINT REPLACEMENT    . LAPAROSCOPIC ABDOMINAL EXPLORATION N/A 11/26/2017   Procedure:  LAPAROSCOPIC ABDOMINAL EXPLORATION, DRAINAGE OF APPENDICEAL ABCESS. PLACEMENT OF DRAIN;  Surgeon: Alphonsa Overall, MD;  Location: [facility];  Service: General;  Laterality: N/A;  . RIGHT/LEFT HEART CATH AND CORONARY ANGIOGRAPHY N/A 04/06/2017   Procedure: RIGHT/LEFT HEART CATH AND CORONARY ANGIOGRAPHY;  Surgeon: Nigel Mormon, MD;  Location: [facility];  Service: Cardiovascular;  Laterality: N/A;  . TOE SURGERY  05/2018   Left   . TOTAL HIP ARTHROPLASTY     Right    Social History   Occupational History  . Occupation: Retired  Tobacco Use  . Smoking status: Former Smoker    Packs/day: 0.25    Years: 5.00    Pack years: 1.25    Types: Cigarettes    Quit date: 1996    Years since quitting: 26.4  . Smokeless tobacco: Never Used  Vaping Use  . Vaping Use: Never used  Substance and Sexual Activity  . Alcohol use: Yes    Comment: occ  . Drug use: No  . Sexual activity: Yes

## 2020-11-20 ENCOUNTER — Ambulatory Visit: Payer: Medicare Other | Admitting: Orthopedic Surgery

## 2020-12-12 ENCOUNTER — Other Ambulatory Visit: Payer: Self-pay | Admitting: Internal Medicine

## 2020-12-12 DIAGNOSIS — R0989 Other specified symptoms and signs involving the circulatory and respiratory systems: Secondary | ICD-10-CM

## 2020-12-18 ENCOUNTER — Ambulatory Visit
Admission: RE | Admit: 2020-12-18 | Discharge: 2020-12-18 | Disposition: A | Discharge status: Home / Self Care | Payer: Medicare Other | Source: Ambulatory Visit | Attending: Internal Medicine | Admitting: Internal Medicine

## 2020-12-18 DIAGNOSIS — R0989 Other specified symptoms and signs involving the circulatory and respiratory systems: Secondary | ICD-10-CM

## 2021-01-15 ENCOUNTER — Encounter: Payer: Medicare Other | Admitting: Vascular Surgery

## 2021-01-18 ENCOUNTER — Other Ambulatory Visit: Payer: Self-pay | Admitting: Cardiology

## 2021-01-18 DIAGNOSIS — I5030 Unspecified diastolic (congestive) heart failure: Secondary | ICD-10-CM

## 2021-01-29 ENCOUNTER — Encounter: Payer: Medicare Other | Admitting: Vascular Surgery

## 2021-02-08 ENCOUNTER — Other Ambulatory Visit: Payer: Self-pay | Admitting: Cardiology

## 2021-02-08 DIAGNOSIS — I5030 Unspecified diastolic (congestive) heart failure: Secondary | ICD-10-CM

## 2021-02-17 ENCOUNTER — Other Ambulatory Visit: Payer: Self-pay | Admitting: Cardiology

## 2021-02-17 DIAGNOSIS — I5032 Chronic diastolic (congestive) heart failure: Secondary | ICD-10-CM

## 2021-02-17 DIAGNOSIS — I5030 Unspecified diastolic (congestive) heart failure: Secondary | ICD-10-CM

## 2021-03-08 ENCOUNTER — Other Ambulatory Visit: Payer: Self-pay | Admitting: Cardiology

## 2021-03-08 DIAGNOSIS — I5032 Chronic diastolic (congestive) heart failure: Secondary | ICD-10-CM

## 2021-03-08 DIAGNOSIS — I5030 Unspecified diastolic (congestive) heart failure: Secondary | ICD-10-CM

## 2021-03-19 ENCOUNTER — Other Ambulatory Visit: Payer: Self-pay | Admitting: Cardiology

## 2021-03-19 DIAGNOSIS — I5030 Unspecified diastolic (congestive) heart failure: Secondary | ICD-10-CM

## 2021-03-21 ENCOUNTER — Ambulatory Visit: Payer: Medicare Other | Admitting: Cardiology

## 2021-03-21 ENCOUNTER — Other Ambulatory Visit: Payer: Self-pay

## 2021-03-21 ENCOUNTER — Encounter: Payer: Self-pay | Admitting: Cardiology

## 2021-03-21 VITALS — BP 168/76 | HR 79 | Temp 97.5°F | Resp 16 | Ht 73.0 in | Wt 246.0 lb

## 2021-03-21 DIAGNOSIS — I2510 Atherosclerotic heart disease of native coronary artery without angina pectoris: Secondary | ICD-10-CM

## 2021-03-21 DIAGNOSIS — E782 Mixed hyperlipidemia: Secondary | ICD-10-CM

## 2021-03-21 DIAGNOSIS — I6523 Occlusion and stenosis of bilateral carotid arteries: Secondary | ICD-10-CM

## 2021-03-21 DIAGNOSIS — I5032 Chronic diastolic (congestive) heart failure: Secondary | ICD-10-CM

## 2021-03-21 DIAGNOSIS — I10 Essential (primary) hypertension: Secondary | ICD-10-CM

## 2021-03-21 DIAGNOSIS — Z794 Long term (current) use of insulin: Secondary | ICD-10-CM

## 2021-03-21 DIAGNOSIS — L97921 Non-pressure chronic ulcer of unspecified part of left lower leg limited to breakdown of skin: Secondary | ICD-10-CM

## 2021-03-21 DIAGNOSIS — I70222 Atherosclerosis of native arteries of extremities with rest pain, left leg: Secondary | ICD-10-CM

## 2021-03-21 MED ORDER — ISOSORB DINITRATE-HYDRALAZINE 20-37.5 MG PO TABS: 2.0000 | ORAL_TABLET | Freq: Three times a day (TID) | ORAL | 3 refills | Status: DC

## 2021-03-21 NOTE — Progress Notes (Signed)
Subjective:   David [name], male    DOB: [birth year], 75 y.o.   MRN: [id number]    Chief complaint:  CAD, Hypertension   HPI  75 year old African-American male with hypertension, type 2 DM with complications including CKD 3, diabetic retinopathy and peripheral neuropathy, CAD s/p complex LAD PCI 03/2017, mod carotid stenosis, HFpEF, WHO grp II pulmonary hypertension.  Patient underwent left 5th toe amputation due to osteomyelitis in 07/2020.  He has not seen me in over a year.  He has not seen [name] since his fifth toe amputation in March 2022.  He has not seen wound care long time.  His wife does dressings herself at home.  Patient's ambulation is limited, but he is not using orthotics recommended by Dr. Jess Barters office back in March 2022.  Blood pressure is uncontrolled.  He is not taking torsemide regularly.  Both legs have swelling.  He has not had any recent labs since March 2022.   Outpatient Medications Prior to Visit  Medication Sig Dispense Refill   albuterol (PROVENTIL HFA;VENTOLIN HFA) 108 (90 Base) MCG/ACT inhaler Inhale 2 puffs into the lungs every 6 (six) hours as needed for wheezing or shortness of breath. 1 Inhaler 2   amLODipine (NORVASC) 5 MG tablet TAKE 1 TABLET BY MOUTH EVERY DAY 30 tablet 5   aspirin EC 81 MG tablet Take 81 mg by mouth daily.     atorvastatin (LIPITOR) 40 MG tablet TAKE 1 TABLET BY MOUTH EVERY DAY 30 tablet 11   dicyclomine (BENTYL) 10 MG capsule Take 1 capsule (10 mg total) by mouth 4 (four) times daily -  before meals and at bedtime. (Patient taking differently: Take 10 mg by mouth at bedtime.) 10 capsule 0   dorzolamide-timolol (COSOPT) 22.3-6.8 MG/ML ophthalmic solution Place 1 drop into both eyes 2 (two) times daily.  98   ferrous sulfate 325 (65 FE) MG tablet Take 325 mg by mouth daily with breakfast.     guaiFENesin (MUCINEX PO) Take 1,200 mg by mouth daily.     HUMALOG MIX 75/25 KWIKPEN (75-25) 100 UNIT/ML Kwikpen Inject 22 Units into  the skin in the morning and at bedtime.     isosorbide-hydrALAZINE (BIDIL) 20-37.5 MG tablet TAKE 2 TABLETS BY MOUTH TWICE A DAY 120 tablet 0   metoprolol succinate (TOPROL-XL) 100 MG 24 hr tablet TAKE 1 TABLET BY MOUTH DAILY, TAKE WITH OR IMMEDIATELY FOLLOWING A MEAL. 30 tablet 11   Multiple Vitamin (MULTIVITAMIN WITH MINERALS) TABS tablet Take 1 tablet by mouth daily. 30 tablet 0   nitroGLYCERIN (NITROSTAT) 0.4 MG SL tablet Place 0.4 mg under the tongue every 5 (five) minutes as needed for chest pain.     oxyCODONE-acetaminophen (PERCOCET) 5-325 MG tablet Take 1 tablet by mouth every 6 (six) hours as needed for severe pain. 30 tablet 0   pentoxifylline (TRENTAL) 400 MG CR tablet Take 1 tablet (400 mg total) by mouth 3 (three) times daily with meals. 90 tablet 3   torsemide (DEMADEX) 20 MG tablet TAKE 1 TABLET BY MOUTH TWICE A DAY 60 tablet 5   traZODone (DESYREL) 100 MG tablet Take 100 mg by mouth at bedtime.      DULoxetine (CYMBALTA) 30 MG capsule Take 30 mg by mouth daily.     nitroGLYCERIN (NITRODUR - DOSED IN MG/24 HR) 0.2 mg/hr patch Place 1 patch (0.2 mg total) onto the skin daily. 30 patch 12   oxyCODONE (OXY IR/ROXICODONE) 5 MG immediate release tablet Take 1-2  tablets (5-10 mg total) by mouth every 4 (four) hours as needed for moderate pain (pain score 4-6). 30 tablet 0   No facility-administered medications prior to visit.   No orders of the defined types were placed in this encounter.  Medications Discontinued During This Encounter  Medication Reason   DULoxetine (CYMBALTA) 30 MG capsule Error   nitroGLYCERIN (NITRODUR - DOSED IN MG/24 HR) 0.2 mg/hr patch Error   oxyCODONE (OXY IR/ROXICODONE) 5 MG immediate release tablet Error      Cardiovascular studies:  EKG 03/21/2021: Sinus rhythm 82 bpm First degree A-V block   US arterial segment multiple lower extremity 12/18/2020: Right: Resting ABI 1.36 borderline elevated potentially from noncompressible vessels. Segmental exam  demonstrates no significant arterial occlusive disease to the ankle. Evidence of small vessel disease of the right foot.   Left: Resting ABI 1.17 within normal limits though may be falsely elevated. Segmental exam demonstrates tibial artery occlusive disease.   Carotid artery duplex  08/16/2019: Minimal stenosis in the right internal carotid artery (1-15%). Stenosis in the right external carotid artery (<50%). Senosis in the left internal carotid artery (50-69%). Stenosis in the left external carotid artery (>50%). Antegrade right vertebral artery flow. Antegrade left vertebral artery flow. Follow up in six months is appropriate if clinically indicated. Compared to 02/01/2019, right ICA stenosis of 15-49% not seen and no change in left ICA stenosis.   Hospital Echocardiogram 07/19/2018:  1. The left ventricle has low normal systolic function, with an ejection fraction of 50-55%. There is concentric left ventricular hypertrophy. No evidence of left ventricular regional wall motion abnormalities. Diastolic function not assessed.  2. The right ventricle has normal systolc function. There is no increase in right ventricular wall thickness.  3. Mild tricuspid regurgitation. This is a limited study and inadequate to assess RV systolic pressure.  4. Unlike previous study in 12/2017, pulmonary hypertension and diastolic dysfunction not adequately evaluated in this limited study. No other significant change noted.   Coronary intervention 04/06/2017: Successful complex PTCA and overlapping stents mid LAD Complexity due to severe calcification and severe tortuosity Resolute 3.0 X 18 mm Synergy 3.0 X 16 mm Residual 10% unde expansion of proximal stent  dLAD 2.75 X 26 mm Resolure DES   Recent labs: 08/03/2020: Glucose 71, BUN/Cr 42/1.98. EGFR 35. Na/K 137/3.9.  H/H 9.5/30.3. MCV 112. Platelets 232 HbA1C 6.2%  08/26/2019: Glucose 421, BUN/Cr 32/2.34. EGFR 27. Na/K 135/5.1. Rest of the CMP  normal  10/12/2018: Glucose 247, BUN/Cr 23/1.85. EGFR 41. Na/K 139/4.8. Rest of the CMP normal H/H 8.0/23.6. MCV 108. Platelets 206  Lab 10/04/2019:  Serum glucose 68 mg, BUN 29, creatinine 2.17, EGFR 36 mL, potassium 4.4.  CMP otherwise normal.  A1c 10.8%.  Total cholesterol 143, triglycerides 152, HDL 40, LDL 76.   Review of Systems  Cardiovascular:  Positive for leg swelling (1-2+). Negative for chest pain, dyspnea on exertion, palpitations and syncope.  Neurological:  Positive for paresthesias.   Today's Vitals   03/21/21 1502  BP: (!) 168/76  Pulse: 79  Resp: 16  Temp: (!) 97.5 F (36.4 C)  SpO2: 94%  Weight: 246 lb (111.6 kg)  Height: '6\' 1"'  (1.854 m)   Body mass index is 32.46 kg/m.   Objective:    Physical Exam Vitals and nursing note reviewed.  Constitutional:      General: He is not in acute distress. Neck:     Vascular: No JVD.  Cardiovascular:     Rate and Rhythm: Normal rate  and regular rhythm.     Pulses: Intact distal pulses.          Carotid pulses are  on the right side with bruit and  on the left side with bruit.      Dorsalis pedis pulses are 2+ on the right side and 1+ on the left side.       Posterior tibial pulses are 0 on the right side and 0 on the left side.     Comments: Non-healing pressure ulcer left sole Pulmonary:     Effort: Pulmonary effort is normal.     Breath sounds: Normal breath sounds. No wheezing or rales.  Abdominal:     General: Bowel sounds are normal.     Palpations: Abdomen is soft.     Tenderness: There is no rebound.  Neurological:     Cranial Nerves: No cranial nerve deficit.      Assessment & Recommendations:   75 year old African-American male with hypertension, type 2 DM, CKD 3, CAD s/p complex LAD PCI 03/2017, mod carotid stenosis, HFpEF, PAD now with nonhealing ulcer left foot  Non-healing ulcer: Pressure ulcer on left sole present for several months. Segmental pressures suggestive of occlusive  tibial artery disease. Needs wound care referral.  Also recommend following up with [name] again for management of orthotics. In view of his clinical limb ischemia, I recommend lower extremity arterial duplex ultrasound followed by lower extremity angiography and possible intervention for limb salvage. Given his CKD, he will likely need CO2 to minimize contrast exposure. Will check baseline labs. Recommend torsemide 20 mg twice daily to reduce leg edema. Continue aspirin 81 mg daily, statin, aggressive risk factor modification. Check labs-including lipid panel and A1C.  HFpEF: Bilateral leg edema.  Recommend torsemide 20 mg twice daily. Check echocardiogram.  Hypertension: Uncontrolled.  He is taking BiDil 2 tablets twice daily.  Increase to 2 tablets 3 times daily.  Continues on antihypertensive therapy.    CAD: Stable. No current angina.  Continue current management. .  Moderate carotid stenoses: Asymptomatic. Continue risk factor modification.  Continue surveillance.   Check carotid US  Hyperlipidemia: Check lipid panel.    Nigel Mormon, MD, [facility] 03/21/2021, 12:37 PM Office: [phone]

## 2021-04-01 ENCOUNTER — Telehealth: Payer: Self-pay

## 2021-04-01 NOTE — Telephone Encounter (Signed)
Tests are necessary before the procedure on 11/22.  Thanks MJP

## 2021-04-01 NOTE — Telephone Encounter (Signed)
FYI-pt cx'd all testing stating swelling has improved since last ov, does not feel its needed

## 2021-04-05 ENCOUNTER — Other Ambulatory Visit: Payer: Medicare Other

## 2021-04-09 ENCOUNTER — Ambulatory Visit (HOSPITAL_COMMUNITY): Admission: RE | Admit: 2021-04-09 | Payer: Medicare Other | Source: Home / Self Care | Admitting: Cardiology

## 2021-04-09 ENCOUNTER — Encounter (HOSPITAL_COMMUNITY): Admission: RE | Payer: Self-pay | Source: Home / Self Care

## 2021-04-09 SURGERY — LOWER EXTREMITY ANGIOGRAPHY
Anesthesia: LOCAL

## 2021-04-19 ENCOUNTER — Ambulatory Visit: Payer: Medicare Other | Admitting: Cardiology

## 2021-04-26 ENCOUNTER — Ambulatory Visit: Payer: Medicare Other | Admitting: Cardiology

## 2021-07-24 ENCOUNTER — Other Ambulatory Visit: Payer: Self-pay | Admitting: Cardiology

## 2021-07-24 DIAGNOSIS — I10 Essential (primary) hypertension: Secondary | ICD-10-CM

## 2021-08-06 DIAGNOSIS — L989 Disorder of the skin and subcutaneous tissue, unspecified: Secondary | ICD-10-CM | POA: Diagnosis not present

## 2021-08-06 DIAGNOSIS — E11621 Type 2 diabetes mellitus with foot ulcer: Secondary | ICD-10-CM | POA: Diagnosis not present

## 2021-08-06 DIAGNOSIS — E1165 Type 2 diabetes mellitus with hyperglycemia: Secondary | ICD-10-CM | POA: Diagnosis not present

## 2021-08-06 DIAGNOSIS — R718 Other abnormality of red blood cells: Secondary | ICD-10-CM | POA: Diagnosis not present

## 2021-08-06 DIAGNOSIS — I10 Essential (primary) hypertension: Secondary | ICD-10-CM | POA: Diagnosis not present

## 2021-08-06 DIAGNOSIS — E11319 Type 2 diabetes mellitus with unspecified diabetic retinopathy without macular edema: Secondary | ICD-10-CM | POA: Diagnosis not present

## 2021-08-06 DIAGNOSIS — Z794 Long term (current) use of insulin: Secondary | ICD-10-CM | POA: Diagnosis not present

## 2021-08-06 DIAGNOSIS — Z89411 Acquired absence of right great toe: Secondary | ICD-10-CM | POA: Diagnosis not present

## 2021-08-06 DIAGNOSIS — I5030 Unspecified diastolic (congestive) heart failure: Secondary | ICD-10-CM | POA: Diagnosis not present

## 2021-08-06 DIAGNOSIS — I739 Peripheral vascular disease, unspecified: Secondary | ICD-10-CM | POA: Diagnosis not present

## 2021-08-06 DIAGNOSIS — E7800 Pure hypercholesterolemia, unspecified: Secondary | ICD-10-CM | POA: Diagnosis not present

## 2021-08-06 DIAGNOSIS — N1832 Chronic kidney disease, stage 3b: Secondary | ICD-10-CM | POA: Diagnosis not present

## 2021-08-09 DIAGNOSIS — H401131 Primary open-angle glaucoma, bilateral, mild stage: Secondary | ICD-10-CM | POA: Diagnosis not present

## 2021-08-09 DIAGNOSIS — H2512 Age-related nuclear cataract, left eye: Secondary | ICD-10-CM | POA: Diagnosis not present

## 2021-08-23 ENCOUNTER — Other Ambulatory Visit: Payer: Self-pay | Admitting: Cardiology

## 2021-08-23 DIAGNOSIS — I5032 Chronic diastolic (congestive) heart failure: Secondary | ICD-10-CM

## 2021-09-11 ENCOUNTER — Other Ambulatory Visit: Payer: Self-pay

## 2021-09-11 DIAGNOSIS — I5032 Chronic diastolic (congestive) heart failure: Secondary | ICD-10-CM

## 2021-09-11 DIAGNOSIS — I10 Essential (primary) hypertension: Secondary | ICD-10-CM

## 2021-09-11 MED ORDER — ISOSORB DINITRATE-HYDRALAZINE 20-37.5 MG PO TABS: ORAL_TABLET | ORAL | 3 refills | Status: DC

## 2021-09-11 MED ORDER — AMLODIPINE BESYLATE 5 MG PO TABS: 5.0000 mg | ORAL_TABLET | Freq: Every day | ORAL | 5 refills | Status: DC

## 2021-09-11 MED ORDER — METOPROLOL SUCCINATE ER 100 MG PO TB24: 100.0000 mg | ORAL_TABLET | Freq: Every day | ORAL | 11 refills | Status: DC

## 2021-09-11 MED ORDER — TORSEMIDE 20 MG PO TABS: 20.0000 mg | ORAL_TABLET | Freq: Two times a day (BID) | ORAL | 5 refills | Status: DC

## 2021-09-11 MED ORDER — ATORVASTATIN CALCIUM 40 MG PO TABS: 40.0000 mg | ORAL_TABLET | Freq: Every day | ORAL | 11 refills | Status: DC

## 2021-12-03 DIAGNOSIS — D509 Iron deficiency anemia, unspecified: Secondary | ICD-10-CM | POA: Diagnosis not present

## 2021-12-03 DIAGNOSIS — K5903 Drug induced constipation: Secondary | ICD-10-CM | POA: Diagnosis not present

## 2021-12-11 DIAGNOSIS — M4726 Other spondylosis with radiculopathy, lumbar region: Secondary | ICD-10-CM | POA: Diagnosis not present

## 2021-12-11 DIAGNOSIS — M5416 Radiculopathy, lumbar region: Secondary | ICD-10-CM | POA: Diagnosis not present

## 2021-12-11 DIAGNOSIS — Z79891 Long term (current) use of opiate analgesic: Secondary | ICD-10-CM | POA: Diagnosis not present

## 2021-12-11 DIAGNOSIS — G894 Chronic pain syndrome: Secondary | ICD-10-CM | POA: Diagnosis not present

## 2022-01-09 ENCOUNTER — Other Ambulatory Visit: Payer: Self-pay | Admitting: Cardiology

## 2022-01-09 DIAGNOSIS — I10 Essential (primary) hypertension: Secondary | ICD-10-CM

## 2022-01-21 DIAGNOSIS — Z743 Need for continuous supervision: Secondary | ICD-10-CM | POA: Diagnosis not present

## 2022-01-21 DIAGNOSIS — R079 Chest pain, unspecified: Secondary | ICD-10-CM | POA: Diagnosis not present

## 2022-01-21 DIAGNOSIS — R739 Hyperglycemia, unspecified: Secondary | ICD-10-CM | POA: Diagnosis not present

## 2022-01-21 DIAGNOSIS — R6889 Other general symptoms and signs: Secondary | ICD-10-CM | POA: Diagnosis not present

## 2022-01-21 DIAGNOSIS — I499 Cardiac arrhythmia, unspecified: Secondary | ICD-10-CM | POA: Diagnosis not present

## 2022-03-10 ENCOUNTER — Other Ambulatory Visit: Payer: Self-pay | Admitting: Cardiology

## 2022-03-10 DIAGNOSIS — I5032 Chronic diastolic (congestive) heart failure: Secondary | ICD-10-CM

## 2022-04-09 ENCOUNTER — Other Ambulatory Visit: Payer: Self-pay | Admitting: Cardiology

## 2022-04-09 DIAGNOSIS — I10 Essential (primary) hypertension: Secondary | ICD-10-CM

## 2022-04-17 ENCOUNTER — Other Ambulatory Visit: Payer: Self-pay

## 2022-04-17 DIAGNOSIS — I10 Essential (primary) hypertension: Secondary | ICD-10-CM

## 2022-04-17 MED ORDER — ISOSORB DINITRATE-HYDRALAZINE 20-37.5 MG PO TABS: 2.0000 | ORAL_TABLET | Freq: Three times a day (TID) | ORAL | 0 refills | Status: DC

## 2022-04-18 ENCOUNTER — Ambulatory Visit: Payer: Self-pay | Admitting: Cardiology

## 2022-04-26 NOTE — Progress Notes (Signed)
Error

## 2022-05-02 ENCOUNTER — Encounter: Payer: Self-pay | Admitting: Cardiology

## 2022-05-02 ENCOUNTER — Ambulatory Visit: Payer: Medicare Other | Admitting: Cardiology

## 2022-05-02 VITALS — BP 160/81 | HR 82 | Resp 16 | Ht 73.0 in | Wt 234.0 lb

## 2022-05-02 DIAGNOSIS — I5032 Chronic diastolic (congestive) heart failure: Secondary | ICD-10-CM

## 2022-05-02 DIAGNOSIS — I10 Essential (primary) hypertension: Secondary | ICD-10-CM

## 2022-05-02 DIAGNOSIS — I2510 Atherosclerotic heart disease of native coronary artery without angina pectoris: Secondary | ICD-10-CM | POA: Diagnosis not present

## 2022-05-02 DIAGNOSIS — I440 Atrioventricular block, first degree: Secondary | ICD-10-CM | POA: Diagnosis not present

## 2022-05-02 MED ORDER — NITROGLYCERIN 0.4 MG SL SUBL: 0.4000 mg | SUBLINGUAL_TABLET | SUBLINGUAL | 3 refills | Status: DC | PRN

## 2022-05-02 MED ORDER — AMLODIPINE BESYLATE 10 MG PO TABS: 10.0000 mg | ORAL_TABLET | Freq: Every day | ORAL | 3 refills | Status: DC

## 2022-05-02 NOTE — Progress Notes (Signed)
Subjective:   David [name], male    DOB: 1945-08-27, 76 y.o.   MRN: [id number]    Chief complaint:  CAD, Hypertension   HPI  76 year old African-American male with hypertension, type 2 DM with complications including CKD 3, diabetic retinopathy and peripheral neuropathy, CAD s/p complex LAD PCI 03/2017, mod carotid stenosis, HFpEF, WHO grp II pulmonary hypertension.  And also the patient 03/2021.  He has since not needed to any follow-up appointments with me. He is here with his wife today. Remarkably, he has made it through without any hospitalizations, or any MALE events.  However, his quality of life has remarkably decreased.  This is primarily due to balance issues and neuropathy.  Likely that he started getting ready for today's visit in the afternoon, at 6:30 AM.  He is barely able to walk any at home.  However, when he does walk/scoot, he has retrosternal burning sensation.  Blood pressure elevated today.   Current Outpatient Medications:    albuterol (PROVENTIL HFA;VENTOLIN HFA) 108 (90 Base) MCG/ACT inhaler, Inhale 2 puffs into the lungs every 6 (six) hours as needed for wheezing or shortness of breath., Disp: 1 Inhaler, Rfl: 2   amLODipine (NORVASC) 5 MG tablet, Take 1 tablet by mouth daily., Disp: 30 tablet, Rfl: 4   aspirin EC 81 MG tablet, Take 81 mg by mouth daily., Disp: , Rfl:    atorvastatin (LIPITOR) 40 MG tablet, Take 1 tablet (40 mg total) by mouth daily., Disp: 30 tablet, Rfl: 11   dicyclomine (BENTYL) 10 MG capsule, Take 1 capsule (10 mg total) by mouth 4 (four) times daily -  before meals and at bedtime. (Patient taking differently: Take 10 mg by mouth at bedtime.), Disp: 10 capsule, Rfl: 0   dorzolamide-timolol (COSOPT) 22.3-6.8 MG/ML ophthalmic solution, Place 1 drop into both eyes 2 (two) times daily., Disp: , Rfl: 98   ferrous sulfate 325 (65 FE) MG tablet, Take 325 mg by mouth daily with breakfast., Disp: , Rfl:    guaiFENesin (MUCINEX PO), Take 1,200 mg  by mouth daily., Disp: , Rfl:    HUMALOG MIX 75/25 KWIKPEN (75-25) 100 UNIT/ML Kwikpen, Inject 22 Units into the skin in the morning and at bedtime., Disp: , Rfl:    isosorbide-hydrALAZINE (BIDIL) 20-37.5 MG tablet, Take 2 tablets by mouth 3 (three) times daily., Disp: 180 tablet, Rfl: 0   metoprolol succinate (TOPROL-XL) 100 MG 24 hr tablet, Take 1 tablet (100 mg total) by mouth daily. TAKE WITH OR IMMEDIATELY FOLLOWING A MEAL., Disp: 30 tablet, Rfl: 11   Multiple Vitamin (MULTIVITAMIN WITH MINERALS) TABS tablet, Take 1 tablet by mouth daily., Disp: 30 tablet, Rfl: 0   nitroGLYCERIN (NITROSTAT) 0.4 MG SL tablet, Place 0.4 mg under the tongue every 5 (five) minutes as needed for chest pain., Disp: , Rfl:    pentoxifylline (TRENTAL) 400 MG CR tablet, Take 1 tablet (400 mg total) by mouth 3 (three) times daily with meals., Disp: 90 tablet, Rfl: 3   traZODone (DESYREL) 100 MG tablet, Take 100 mg by mouth at bedtime. , Disp: , Rfl:    torsemide (DEMADEX) 20 MG tablet, Take 1 tablet by mouth twice daily. (Patient not taking: Reported on 05/02/2022), Disp: 60 tablet, Rfl: 4   Cardiovascular studies:  EKG 05/02/2022: Sinus rhythm 84 bpm First degree A-V block Occasional ectopic ventricular beat   Nonspecific ST depression -Nondiagnostic   US arterial segment multiple lower extremity 12/18/2020: Right: Resting ABI 1.36 borderline elevated potentially from noncompressible  vessels. Segmental exam demonstrates no significant arterial occlusive disease to the ankle. Evidence of small vessel disease of the right foot.   Left: Resting ABI 1.17 within normal limits though may be falsely elevated. Segmental exam demonstrates tibial artery occlusive disease.   Carotid artery duplex  08/16/2019: Minimal stenosis in the right internal carotid artery (1-15%). Stenosis in the right external carotid artery (<50%). Senosis in the left internal carotid artery (50-69%). Stenosis in the left external carotid  artery (>50%). Antegrade right vertebral artery flow. Antegrade left vertebral artery flow. Follow up in six months is appropriate if clinically indicated. Compared to 02/01/2019, right ICA stenosis of 15-49% not seen and no change in left ICA stenosis.   Hospital Echocardiogram 07/19/2018:  1. The left ventricle has low normal systolic function, with an ejection fraction of 50-55%. There is concentric left ventricular hypertrophy. No evidence of left ventricular regional wall motion abnormalities. Diastolic function not assessed.  2. The right ventricle has normal systolc function. There is no increase in right ventricular wall thickness.  3. Mild tricuspid regurgitation. This is a limited study and inadequate to assess RV systolic pressure.  4. Unlike previous study in 12/2017, pulmonary hypertension and diastolic dysfunction not adequately evaluated in this limited study. No other significant change noted.   Coronary intervention 04/06/2017: Successful complex PTCA and overlapping stents mid LAD Complexity due to severe calcification and severe tortuosity Resolute 3.0 X 18 mm Synergy 3.0 X 16 mm Residual 10% unde expansion of proximal stent  dLAD 2.75 X 26 mm Resolure DES   Recent labs: 08/03/2020: Glucose 71, BUN/Cr 42/1.98. EGFR 35. Na/K 137/3.9.  H/H 9.5/30.3. MCV 112. Platelets 232 HbA1C 6.2%  08/26/2019: Glucose 421, BUN/Cr 32/2.34. EGFR 27. Na/K 135/5.1. Rest of the CMP normal  10/12/2018: Glucose 247, BUN/Cr 23/1.85. EGFR 41. Na/K 139/4.8. Rest of the CMP normal H/H 8.0/23.6. MCV 108. Platelets 206  Lab 10/04/2019:  Serum glucose 68 mg, BUN 29, creatinine 2.17, EGFR 36 mL, potassium 4.4.  CMP otherwise normal.  A1c 10.8%.  Total cholesterol 143, triglycerides 152, HDL 40, LDL 76.   Review of Systems  Cardiovascular:  Positive for leg swelling (1-2+). Negative for chest pain, dyspnea on exertion, palpitations and syncope.  Neurological:  Positive for paresthesias.     Today's Vitals   05/02/22 1407 05/02/22 1411  BP: (!) 164/78 (!) 160/81  Pulse: 88 82  Resp: 16   SpO2: 95%   Weight: 234 lb (106.1 kg)   Height: _0  (1.854 m)    Body mass index is 30.87 kg/m.   Objective:    Physical Exam Vitals and nursing note reviewed.  Constitutional:      General: He is not in acute distress. Neck:     Vascular: No JVD.  Cardiovascular:     Rate and Rhythm: Normal rate and regular rhythm.     Pulses: Intact distal pulses.          Carotid pulses are  on the right side with bruit and  on the left side with bruit.      Dorsalis pedis pulses are 0 on the right side and 0 on the left side.       Posterior tibial pulses are 0 on the right side and 0 on the left side.  Pulmonary:     Effort: Pulmonary effort is normal.     Breath sounds: Normal breath sounds. No wheezing or rales.  Abdominal:     General: Bowel sounds are normal.  Palpations: Abdomen is soft.     Tenderness: There is no rebound.  Musculoskeletal:     Right lower leg: No edema.     Left lower leg: No edema.  Neurological:     Cranial Nerves: No cranial nerve deficit.     Assessment & Recommendations:   76 year old African-American male with hypertension, type 2 DM, CKD 3, CAD s/p complex LAD PCI 03/2017, mod carotid stenosis, HFpEF, PAD   CAD: Stable angina symptoms with minimal physical activity.  Increase amlodipine to 10 mg daily. Continue metoprolol succinate 100 mg daily, Bidil. In future, could add Ranexa. Continue Aspirin, statin Refilled SL NTG Will check baseline labs today.   PAD: S/p toe amputations in both feet. No nonhealing wounds at this time. With his nearly non-existent physical activity, he has no symptoms of claudication. Continue medical management. No plans for invasive management at this time.   My initial plan was to proceed with stress testing, echocardiogram, but potential consideration for invasive management in future.  However after  talking more with the patient and his wife, it is evident that patient is more concerned about his quality of life and longevity.  He does not want to consider invasive therapy as far as possible.  In fact, I have even encouraged him to discuss at subsequent visits, advance care directive. He prefers future visits and a video visit format, which is okay with me.  Time spent: 40-minute   Nigel Mormon, MD, [facility] 05/02/2022, 12:22 PM Office: [phone]

## 2022-05-03 LAB — BASIC METABOLIC PANEL
BUN/Creatinine Ratio: 14 (ref 10–24)
BUN: 29 mg/dL — ABNORMAL HIGH (ref 8–27)
CO2: 20 mmol/L (ref 20–29)
Calcium: 8.9 mg/dL (ref 8.6–10.2)
Chloride: 103 mmol/L (ref 96–106)
Creatinine, Ser: 2.04 mg/dL — ABNORMAL HIGH (ref 0.76–1.27)
Glucose: 271 mg/dL — ABNORMAL HIGH (ref 70–99)
Potassium: 4.5 mmol/L (ref 3.5–5.2)
Sodium: 138 mmol/L (ref 134–144)
eGFR: 33 mL/min/{1.73_m2} — ABNORMAL LOW (ref >59)

## 2022-06-08 ENCOUNTER — Other Ambulatory Visit: Payer: Self-pay | Admitting: Cardiology

## 2022-06-08 DIAGNOSIS — I10 Essential (primary) hypertension: Secondary | ICD-10-CM

## 2022-06-13 ENCOUNTER — Telehealth: Payer: Self-pay | Admitting: Cardiology

## 2022-06-13 VITALS — BP 160/87 | HR 74 | Ht 73.0 in | Wt 230.0 lb

## 2022-06-13 DIAGNOSIS — I739 Peripheral vascular disease, unspecified: Secondary | ICD-10-CM | POA: Insufficient documentation

## 2022-06-13 DIAGNOSIS — I2510 Atherosclerotic heart disease of native coronary artery without angina pectoris: Secondary | ICD-10-CM

## 2022-06-13 NOTE — Progress Notes (Signed)
Telephone visit  Subjective:   David [name], male    DOB: 03/11/1946, 77 y.o.   MRN: [id number]    Chief complaint:  CAD, Hypertension   HPI  77 year old African-American male with hypertension, type 2 DM with complications including CKD 3, diabetic retinopathy and peripheral neuropathy, CAD s/p complex LAD PCI 03/2017, mod carotid stenosis, HFpEF, WHO grp II pulmonary hypertension.    Current Outpatient Medications:    albuterol (PROVENTIL HFA;VENTOLIN HFA) 108 (90 Base) MCG/ACT inhaler, Inhale 2 puffs into the lungs every 6 (six) hours as needed for wheezing or shortness of breath., Disp: 1 Inhaler, Rfl: 2   aspirin EC 81 MG tablet, Take 81 mg by mouth daily., Disp: , Rfl:    atorvastatin (LIPITOR) 40 MG tablet, Take 1 tablet (40 mg total) by mouth daily., Disp: 30 tablet, Rfl: 11   dicyclomine (BENTYL) 10 MG capsule, Take 1 capsule (10 mg total) by mouth 4 (four) times daily -  before meals and at bedtime., Disp: 10 capsule, Rfl: 0   docusate sodium (COLACE) 100 MG capsule, Take 100 mg by mouth daily., Disp: , Rfl:    dorzolamide-timolol (COSOPT) 22.3-6.8 MG/ML ophthalmic solution, Place 1 drop into both eyes 2 (two) times daily., Disp: , Rfl: 98   ferrous sulfate 325 (65 FE) MG tablet, Take 325 mg by mouth daily with breakfast., Disp: , Rfl:    guaiFENesin (MUCINEX PO), Take 1,200 mg by mouth daily., Disp: , Rfl:    HUMALOG MIX 75/25 KWIKPEN (75-25) 100 UNIT/ML Kwikpen, Inject 36 Units into the skin in the morning and at bedtime., Disp: , Rfl:    HYDROcodone-acetaminophen (NORCO) 10-325 MG tablet, Take 1 tablet by mouth daily., Disp: , Rfl:    isosorbide-hydrALAZINE (BIDIL) 20-37.5 MG tablet, Take 2 tablets by mouth three times daily., Disp: 180 tablet, Rfl: 0   metoprolol succinate (TOPROL-XL) 100 MG 24 hr tablet, Take 1 tablet (100 mg total) by mouth daily. TAKE WITH OR IMMEDIATELY FOLLOWING A MEAL., Disp: 30 tablet, Rfl: 11   Multiple Vitamin (MULTIVITAMIN WITH MINERALS)  TABS tablet, Take 1 tablet by mouth daily., Disp: 30 tablet, Rfl: 0   nitroGLYCERIN (NITROSTAT) 0.4 MG SL tablet, Place 1 tablet (0.4 mg total) under the tongue every 5 (five) minutes as needed for chest pain., Disp: 30 tablet, Rfl: 3   traZODone (DESYREL) 100 MG tablet, Take 100 mg by mouth at bedtime. , Disp: , Rfl:    amLODipine (NORVASC) 10 MG tablet, Take 1 tablet (10 mg total) by mouth daily., Disp: 90 tablet, Rfl: 3   torsemide (DEMADEX) 20 MG tablet, Take 1 tablet by mouth twice daily. (Patient not taking: Reported on 06/13/2022), Disp: 60 tablet, Rfl: 4   Cardiovascular studies:  EKG 05/02/2022: Sinus rhythm 84 bpm First degree A-V block Occasional ectopic ventricular beat   Nonspecific ST depression -Nondiagnostic   US arterial segment multiple lower extremity 12/18/2020: Right: Resting ABI 1.36 borderline elevated potentially from noncompressible vessels. Segmental exam demonstrates no significant arterial occlusive disease to the ankle. Evidence of small vessel disease of the right foot.   Left: Resting ABI 1.17 within normal limits though may be falsely elevated. Segmental exam demonstrates tibial artery occlusive disease.   Carotid artery duplex  08/16/2019: Minimal stenosis in the right internal carotid artery (1-15%). Stenosis in the right external carotid artery (<50%). Senosis in the left internal carotid artery (50-69%). Stenosis in the left external carotid artery (>50%). Antegrade right vertebral artery flow. Antegrade left vertebral artery flow. Follow  up in six months is appropriate if clinically indicated. Compared to 02/01/2019, right ICA stenosis of 15-49% not seen and no change in left ICA stenosis.   Hospital Echocardiogram 07/19/2018:  1. The left ventricle has low normal systolic function, with an ejection fraction of 50-55%. There is concentric left ventricular hypertrophy. No evidence of left ventricular regional wall motion abnormalities. Diastolic  function not assessed.  2. The right ventricle has normal systolc function. There is no increase in right ventricular wall thickness.  3. Mild tricuspid regurgitation. This is a limited study and inadequate to assess RV systolic pressure.  4. Unlike previous study in 12/2017, pulmonary hypertension and diastolic dysfunction not adequately evaluated in this limited study. No other significant change noted.   Coronary intervention 04/06/2017: Successful complex PTCA and overlapping stents mid LAD Complexity due to severe calcification and severe tortuosity Resolute 3.0 X 18 mm Synergy 3.0 X 16 mm Residual 10% unde expansion of proximal stent  dLAD 2.75 X 26 mm Resolure DES   Recent labs: 08/03/2020: Glucose 71, BUN/Cr 42/1.98. EGFR 35. Na/K 137/3.9.  H/H 9.5/30.3. MCV 112. Platelets 232 HbA1C 6.2%  08/26/2019: Glucose 421, BUN/Cr 32/2.34. EGFR 27. Na/K 135/5.1. Rest of the CMP normal  10/12/2018: Glucose 247, BUN/Cr 23/1.85. EGFR 41. Na/K 139/4.8. Rest of the CMP normal H/H 8.0/23.6. MCV 108. Platelets 206  Lab 10/04/2019:  Serum glucose 68 mg, BUN 29, creatinine 2.17, EGFR 36 mL, potassium 4.4.  CMP otherwise normal.  A1c 10.8%.  Total cholesterol 143, triglycerides 152, HDL 40, LDL 76.   Review of Systems  Cardiovascular:  Positive for leg swelling (1-2+). Negative for chest pain, dyspnea on exertion, palpitations and syncope.  Neurological:  Positive for paresthesias.    Today's Vitals   06/13/22 1448  Weight: 230 lb (104.3 kg)  Height: 6\' 1"  (1.854 m)   Body mass index is 30.34 kg/m.   Objective:    Physical Exam Vitals and nursing note reviewed.  Constitutional:      General: He is not in acute distress. Neck:     Vascular: No JVD.  Cardiovascular:     Rate and Rhythm: Normal rate and regular rhythm.     Pulses: Intact distal pulses.          Carotid pulses are  on the right side with bruit and  on the left side with bruit.      Dorsalis pedis pulses are 0  on the right side and 0 on the left side.       Posterior tibial pulses are 0 on the right side and 0 on the left side.  Pulmonary:     Effort: Pulmonary effort is normal.     Breath sounds: Normal breath sounds. No wheezing or rales.  Abdominal:     General: Bowel sounds are normal.     Palpations: Abdomen is soft.     Tenderness: There is no rebound.  Musculoskeletal:     Right lower leg: No edema.     Left lower leg: No edema.  Neurological:     Cranial Nerves: No cranial nerve deficit.     Assessment & Recommendations:   77 year old African-American male with hypertension, type 2 DM, CKD 3, CAD s/p complex LAD PCI 03/2017, mod carotid stenosis, HFpEF, PAD   CAD: Stable angina symptoms with minimal physical activity.  Continue amlodipine to 10 mg daily, metoprolol succinate 100 mg daily, Bidil. In future, could add Ranexa. Continue Aspirin, statin Refilled SL NTG  PAD: S/p toe  amputations in both feet. No nonhealing wounds at this time. With his nearly non-existent physical activity, he has no symptoms of claudication. Continue medical management. No plans for invasive management at this time.  Conservative management. No further invasive workup.     Nigel Mormon, MD, [facility] 06/13/2022, 3:14 PM Office: [phone]

## 2022-06-29 ENCOUNTER — Other Ambulatory Visit: Payer: Self-pay

## 2022-06-29 ENCOUNTER — Encounter (HOSPITAL_COMMUNITY): Payer: Self-pay | Admitting: Family Medicine

## 2022-06-29 ENCOUNTER — Inpatient Hospital Stay (HOSPITAL_COMMUNITY)
Admission: EM | Admit: 2022-06-29 | Discharge: 2022-07-04 | DRG: 291 | Discharge status: Against Medical Advice | Payer: Medicare Other | Attending: Internal Medicine | Admitting: Internal Medicine

## 2022-06-29 ENCOUNTER — Emergency Department (HOSPITAL_COMMUNITY): Payer: Medicare Other

## 2022-06-29 DIAGNOSIS — Z794 Long term (current) use of insulin: Secondary | ICD-10-CM

## 2022-06-29 DIAGNOSIS — I2510 Atherosclerotic heart disease of native coronary artery without angina pectoris: Secondary | ICD-10-CM | POA: Diagnosis present

## 2022-06-29 DIAGNOSIS — I5043 Acute on chronic combined systolic (congestive) and diastolic (congestive) heart failure: Secondary | ICD-10-CM

## 2022-06-29 DIAGNOSIS — H409 Unspecified glaucoma: Secondary | ICD-10-CM | POA: Diagnosis present

## 2022-06-29 DIAGNOSIS — Z820 Family history of epilepsy and other diseases of the nervous system: Secondary | ICD-10-CM

## 2022-06-29 DIAGNOSIS — I5033 Acute on chronic diastolic (congestive) heart failure: Secondary | ICD-10-CM | POA: Diagnosis present

## 2022-06-29 DIAGNOSIS — E1122 Type 2 diabetes mellitus with diabetic chronic kidney disease: Secondary | ICD-10-CM | POA: Diagnosis not present

## 2022-06-29 DIAGNOSIS — N1832 Chronic kidney disease, stage 3b: Secondary | ICD-10-CM | POA: Diagnosis not present

## 2022-06-29 DIAGNOSIS — E11319 Type 2 diabetes mellitus with unspecified diabetic retinopathy without macular edema: Secondary | ICD-10-CM | POA: Diagnosis not present

## 2022-06-29 DIAGNOSIS — I272 Pulmonary hypertension, unspecified: Secondary | ICD-10-CM | POA: Diagnosis present

## 2022-06-29 DIAGNOSIS — E1151 Type 2 diabetes mellitus with diabetic peripheral angiopathy without gangrene: Secondary | ICD-10-CM | POA: Diagnosis present

## 2022-06-29 DIAGNOSIS — D631 Anemia in chronic kidney disease: Secondary | ICD-10-CM | POA: Diagnosis not present

## 2022-06-29 DIAGNOSIS — R609 Edema, unspecified: Secondary | ICD-10-CM | POA: Diagnosis not present

## 2022-06-29 DIAGNOSIS — Z89422 Acquired absence of other left toe(s): Secondary | ICD-10-CM | POA: Diagnosis not present

## 2022-06-29 DIAGNOSIS — E785 Hyperlipidemia, unspecified: Secondary | ICD-10-CM | POA: Diagnosis not present

## 2022-06-29 DIAGNOSIS — I130 Hypertensive heart and chronic kidney disease with heart failure and stage 1 through stage 4 chronic kidney disease, or unspecified chronic kidney disease: Secondary | ICD-10-CM | POA: Diagnosis not present

## 2022-06-29 DIAGNOSIS — K219 Gastro-esophageal reflux disease without esophagitis: Secondary | ICD-10-CM | POA: Diagnosis present

## 2022-06-29 DIAGNOSIS — Z833 Family history of diabetes mellitus: Secondary | ICD-10-CM

## 2022-06-29 DIAGNOSIS — Z23 Encounter for immunization: Secondary | ICD-10-CM | POA: Diagnosis not present

## 2022-06-29 DIAGNOSIS — Z89411 Acquired absence of right great toe: Secondary | ICD-10-CM

## 2022-06-29 DIAGNOSIS — E669 Obesity, unspecified: Secondary | ICD-10-CM | POA: Diagnosis present

## 2022-06-29 DIAGNOSIS — I10 Essential (primary) hypertension: Secondary | ICD-10-CM | POA: Diagnosis not present

## 2022-06-29 DIAGNOSIS — I509 Heart failure, unspecified: Secondary | ICD-10-CM

## 2022-06-29 DIAGNOSIS — Z1152 Encounter for screening for COVID-19: Secondary | ICD-10-CM

## 2022-06-29 DIAGNOSIS — E119 Type 2 diabetes mellitus without complications: Secondary | ICD-10-CM | POA: Diagnosis present

## 2022-06-29 DIAGNOSIS — Z8249 Family history of ischemic heart disease and other diseases of the circulatory system: Secondary | ICD-10-CM

## 2022-06-29 DIAGNOSIS — I739 Peripheral vascular disease, unspecified: Secondary | ICD-10-CM | POA: Diagnosis present

## 2022-06-29 DIAGNOSIS — Z6830 Body mass index (BMI) 30.0-30.9, adult: Secondary | ICD-10-CM

## 2022-06-29 DIAGNOSIS — Z5329 Procedure and treatment not carried out because of patient's decision for other reasons: Secondary | ICD-10-CM | POA: Diagnosis present

## 2022-06-29 DIAGNOSIS — Z83438 Family history of other disorder of lipoprotein metabolism and other lipidemia: Secondary | ICD-10-CM

## 2022-06-29 DIAGNOSIS — Z79899 Other long term (current) drug therapy: Secondary | ICD-10-CM | POA: Diagnosis not present

## 2022-06-29 DIAGNOSIS — E1140 Type 2 diabetes mellitus with diabetic neuropathy, unspecified: Secondary | ICD-10-CM | POA: Diagnosis present

## 2022-06-29 DIAGNOSIS — Z89421 Acquired absence of other right toe(s): Secondary | ICD-10-CM

## 2022-06-29 DIAGNOSIS — R0602 Shortness of breath: Secondary | ICD-10-CM | POA: Diagnosis not present

## 2022-06-29 DIAGNOSIS — J810 Acute pulmonary edema: Secondary | ICD-10-CM | POA: Diagnosis not present

## 2022-06-29 DIAGNOSIS — Z955 Presence of coronary angioplasty implant and graft: Secondary | ICD-10-CM

## 2022-06-29 DIAGNOSIS — E1169 Type 2 diabetes mellitus with other specified complication: Secondary | ICD-10-CM | POA: Diagnosis present

## 2022-06-29 DIAGNOSIS — Z743 Need for continuous supervision: Secondary | ICD-10-CM | POA: Diagnosis not present

## 2022-06-29 DIAGNOSIS — Z8349 Family history of other endocrine, nutritional and metabolic diseases: Secondary | ICD-10-CM

## 2022-06-29 DIAGNOSIS — R739 Hyperglycemia, unspecified: Secondary | ICD-10-CM | POA: Diagnosis not present

## 2022-06-29 DIAGNOSIS — Z7982 Long term (current) use of aspirin: Secondary | ICD-10-CM

## 2022-06-29 DIAGNOSIS — E66811 Obesity, class 1: Secondary | ICD-10-CM | POA: Diagnosis present

## 2022-06-29 DIAGNOSIS — R0902 Hypoxemia: Secondary | ICD-10-CM | POA: Diagnosis not present

## 2022-06-29 DIAGNOSIS — Z87891 Personal history of nicotine dependence: Secondary | ICD-10-CM

## 2022-06-29 DIAGNOSIS — R6889 Other general symptoms and signs: Secondary | ICD-10-CM | POA: Diagnosis not present

## 2022-06-29 DIAGNOSIS — Z96641 Presence of right artificial hip joint: Secondary | ICD-10-CM | POA: Diagnosis present

## 2022-06-29 DIAGNOSIS — Z823 Family history of stroke: Secondary | ICD-10-CM

## 2022-06-29 LAB — CBC WITH DIFFERENTIAL/PLATELET
Abs Immature Granulocytes: 0.05 10*3/uL (ref 0.00–0.07)
Basophils Absolute: 0 10*3/uL (ref 0.0–0.1)
Basophils Relative: 0 %
Eosinophils Absolute: 0 10*3/uL (ref 0.0–0.5)
Eosinophils Relative: 0 %
HCT: 32.8 % — ABNORMAL LOW (ref 39.0–52.0)
Hemoglobin: 10.8 g/dL — ABNORMAL LOW (ref 13.0–17.0)
Immature Granulocytes: 0 %
Lymphocytes Relative: 5 %
Lymphs Abs: 0.6 10*3/uL — ABNORMAL LOW (ref 0.7–4.0)
MCH: 34.8 pg — ABNORMAL HIGH (ref 26.0–34.0)
MCHC: 32.9 g/dL (ref 30.0–36.0)
MCV: 105.8 fL — ABNORMAL HIGH (ref 80.0–100.0)
Monocytes Absolute: 0.8 10*3/uL (ref 0.1–1.0)
Monocytes Relative: 7 %
Neutro Abs: 10.4 10*3/uL — ABNORMAL HIGH (ref 1.7–7.7)
Neutrophils Relative %: 88 %
Platelets: 257 10*3/uL (ref 150–400)
RBC: 3.1 MIL/uL — ABNORMAL LOW (ref 4.22–5.81)
RDW: 12.4 % (ref 11.5–15.5)
WBC: 11.8 10*3/uL — ABNORMAL HIGH (ref 4.0–10.5)
nRBC: 0 % (ref 0.0–0.2)

## 2022-06-29 LAB — CBG MONITORING, ED: Glucose-Capillary: 372 mg/dL — ABNORMAL HIGH (ref 70–99)

## 2022-06-29 LAB — BRAIN NATRIURETIC PEPTIDE: B Natriuretic Peptide: 243.4 pg/mL — ABNORMAL HIGH (ref 0.0–100.0)

## 2022-06-29 LAB — BASIC METABOLIC PANEL
Anion gap: 12 (ref 5–15)
BUN: 21 mg/dL (ref 8–23)
CO2: 21 mmol/L — ABNORMAL LOW (ref 22–32)
Calcium: 8.8 mg/dL — ABNORMAL LOW (ref 8.9–10.3)
Chloride: 101 mmol/L (ref 98–111)
Creatinine, Ser: 1.87 mg/dL — ABNORMAL HIGH (ref 0.61–1.24)
GFR, Estimated: 37 mL/min — ABNORMAL LOW (ref >60)
Glucose, Bld: 462 mg/dL — ABNORMAL HIGH (ref 70–99)
Potassium: 3.9 mmol/L (ref 3.5–5.1)
Sodium: 134 mmol/L — ABNORMAL LOW (ref 135–145)

## 2022-06-29 LAB — HEMOGLOBIN A1C
Hgb A1c MFr Bld: 8.6 % — ABNORMAL HIGH (ref 4.8–5.6)
Mean Plasma Glucose: 200.12 mg/dL

## 2022-06-29 LAB — RESP PANEL BY RT-PCR (RSV, FLU A&B, COVID)  RVPGX2
Influenza A by PCR: NEGATIVE
Influenza B by PCR: NEGATIVE
Resp Syncytial Virus by PCR: NEGATIVE
SARS Coronavirus 2 by RT PCR: NEGATIVE

## 2022-06-29 LAB — GLUCOSE, CAPILLARY
Glucose-Capillary: 345 mg/dL — ABNORMAL HIGH (ref 70–99)
Glucose-Capillary: 104 mg/dL — ABNORMAL HIGH (ref 70–99)

## 2022-06-29 LAB — TROPONIN I (HIGH SENSITIVITY)
Troponin I (High Sensitivity): 18 ng/L — ABNORMAL HIGH (ref <18)
Troponin I (High Sensitivity): 90 ng/L — ABNORMAL HIGH (ref <18)

## 2022-06-29 MED ORDER — HYDROCODONE-ACETAMINOPHEN 10-325 MG PO TABS
1.0000 | ORAL_TABLET | Freq: Three times a day (TID) | ORAL | Status: DC | PRN
  Administered 2022-06-29 – 2022-07-04 (×9): 1 via ORAL
  Filled 2022-06-29 (×9): qty 1

## 2022-06-29 MED ORDER — TRAZODONE HCL 50 MG PO TABS
100.0000 mg | ORAL_TABLET | Freq: Every day | ORAL | Status: DC
  Administered 2022-06-29 – 2022-07-03 (×5): 100 mg via ORAL
  Filled 2022-06-29 (×5): qty 2

## 2022-06-29 MED ORDER — FUROSEMIDE 10 MG/ML IJ SOLN
40.0000 mg | INTRAMUSCULAR | Status: AC
  Administered 2022-06-29: 40 mg via INTRAVENOUS
  Filled 2022-06-29: qty 4

## 2022-06-29 MED ORDER — NITROGLYCERIN 0.4 MG SL SUBL: 0.4000 mg | SUBLINGUAL_TABLET | SUBLINGUAL | Status: DC | PRN

## 2022-06-29 MED ORDER — DORZOLAMIDE HCL-TIMOLOL MAL 2-0.5 % OP SOLN
1.0000 [drp] | Freq: Two times a day (BID) | OPHTHALMIC | Status: DC
  Administered 2022-06-29 – 2022-07-04 (×11): 1 [drp] via OPHTHALMIC
  Filled 2022-06-29: qty 10

## 2022-06-29 MED ORDER — DICYCLOMINE HCL 10 MG PO CAPS
10.0000 mg | ORAL_CAPSULE | Freq: Three times a day (TID) | ORAL | Status: DC
  Administered 2022-06-29 – 2022-07-04 (×20): 10 mg via ORAL
  Filled 2022-06-29 (×23): qty 1

## 2022-06-29 MED ORDER — TORSEMIDE 20 MG PO TABS
20.0000 mg | ORAL_TABLET | Freq: Two times a day (BID) | ORAL | Status: DC
  Administered 2022-06-29 (×2): 20 mg via ORAL
  Filled 2022-06-29 (×2): qty 1

## 2022-06-29 MED ORDER — FERROUS SULFATE 325 (65 FE) MG PO TABS
325.0000 mg | ORAL_TABLET | Freq: Every day | ORAL | Status: DC
  Administered 2022-06-30 – 2022-07-04 (×5): 325 mg via ORAL
  Filled 2022-06-29 (×6): qty 1

## 2022-06-29 MED ORDER — FUROSEMIDE 10 MG/ML IJ SOLN
INTRAMUSCULAR | Status: AC
  Filled 2022-06-29: qty 4

## 2022-06-29 MED ORDER — INFLUENZA VAC A&B SA ADJ QUAD 0.5 ML IM PRSY
0.5000 mL | PREFILLED_SYRINGE | INTRAMUSCULAR | Status: AC
  Administered 2022-07-01: 0.5 mL via INTRAMUSCULAR
  Filled 2022-06-29: qty 0.5

## 2022-06-29 MED ORDER — METOPROLOL SUCCINATE ER 100 MG PO TB24
100.0000 mg | ORAL_TABLET | Freq: Every day | ORAL | Status: DC
  Administered 2022-06-29 – 2022-07-04 (×6): 100 mg via ORAL
  Filled 2022-06-29: qty 1
  Filled 2022-06-29: qty 4
  Filled 2022-06-29 (×4): qty 1

## 2022-06-29 MED ORDER — TORSEMIDE 20 MG PO TABS: 20.0000 mg | ORAL_TABLET | Freq: Two times a day (BID) | ORAL | Status: DC

## 2022-06-29 MED ORDER — ASPIRIN 81 MG PO TBEC
81.0000 mg | DELAYED_RELEASE_TABLET | Freq: Every day | ORAL | Status: DC
  Administered 2022-06-30 – 2022-07-04 (×5): 81 mg via ORAL
  Filled 2022-06-29 (×6): qty 1

## 2022-06-29 MED ORDER — ACETAMINOPHEN 325 MG PO TABS: 650.0000 mg | ORAL_TABLET | ORAL | Status: DC | PRN

## 2022-06-29 MED ORDER — ATORVASTATIN CALCIUM 40 MG PO TABS
40.0000 mg | ORAL_TABLET | Freq: Every day | ORAL | Status: DC
  Administered 2022-06-29 – 2022-07-04 (×6): 40 mg via ORAL
  Filled 2022-06-29 (×6): qty 1

## 2022-06-29 MED ORDER — ALBUTEROL SULFATE (2.5 MG/3ML) 0.083% IN NEBU: 2.5000 mg | INHALATION_SOLUTION | Freq: Four times a day (QID) | RESPIRATORY_TRACT | Status: DC | PRN

## 2022-06-29 MED ORDER — INSULIN ASPART 100 UNIT/ML IJ SOLN
0.0000 [IU] | Freq: Three times a day (TID) | INTRAMUSCULAR | Status: DC
  Administered 2022-06-29: 15 [IU] via SUBCUTANEOUS
  Administered 2022-06-29: 20 [IU] via SUBCUTANEOUS
  Administered 2022-06-30 (×2): 4 [IU] via SUBCUTANEOUS
  Administered 2022-06-30: 3 [IU] via SUBCUTANEOUS
  Administered 2022-07-01: 7 [IU] via SUBCUTANEOUS
  Administered 2022-07-01: 15 [IU] via SUBCUTANEOUS
  Administered 2022-07-02: 7 [IU] via SUBCUTANEOUS
  Administered 2022-07-02 (×2): 11 [IU] via SUBCUTANEOUS
  Administered 2022-07-03: 7 [IU] via SUBCUTANEOUS
  Administered 2022-07-03: 11 [IU] via SUBCUTANEOUS
  Administered 2022-07-03 – 2022-07-04 (×3): 4 [IU] via SUBCUTANEOUS

## 2022-06-29 MED ORDER — ISOSORB DINITRATE-HYDRALAZINE 20-37.5 MG PO TABS
2.0000 | ORAL_TABLET | Freq: Three times a day (TID) | ORAL | Status: DC
  Administered 2022-06-29 – 2022-07-04 (×15): 2 via ORAL
  Filled 2022-06-29 (×15): qty 2

## 2022-06-29 MED ORDER — AMLODIPINE BESYLATE 10 MG PO TABS
10.0000 mg | ORAL_TABLET | Freq: Every day | ORAL | Status: DC
  Administered 2022-06-29 – 2022-07-04 (×6): 10 mg via ORAL
  Filled 2022-06-29 (×5): qty 1
  Filled 2022-06-29: qty 2

## 2022-06-29 MED ORDER — SODIUM CHLORIDE 0.9 % IV SOLN: 250.0000 mL | INTRAVENOUS | Status: DC | PRN

## 2022-06-29 MED ORDER — ONDANSETRON HCL 4 MG/2ML IJ SOLN: 4.0000 mg | Freq: Four times a day (QID) | INTRAMUSCULAR | Status: DC | PRN

## 2022-06-29 MED ORDER — SODIUM CHLORIDE 0.9% FLUSH
3.0000 mL | Freq: Two times a day (BID) | INTRAVENOUS | Status: DC
  Administered 2022-06-29 – 2022-07-04 (×11): 3 mL via INTRAVENOUS

## 2022-06-29 MED ORDER — SODIUM CHLORIDE 0.9% FLUSH: 3.0000 mL | INTRAVENOUS | Status: DC | PRN

## 2022-06-29 MED ORDER — ENOXAPARIN SODIUM 40 MG/0.4ML IJ SOSY
40.0000 mg | PREFILLED_SYRINGE | INTRAMUSCULAR | Status: DC
  Administered 2022-06-29 – 2022-07-02 (×4): 40 mg via SUBCUTANEOUS
  Filled 2022-06-29 (×4): qty 0.4

## 2022-06-29 MED ORDER — DOCUSATE SODIUM 100 MG PO CAPS
100.0000 mg | ORAL_CAPSULE | Freq: Every day | ORAL | Status: DC
  Administered 2022-06-29 – 2022-07-04 (×6): 100 mg via ORAL
  Filled 2022-06-29 (×6): qty 1

## 2022-06-29 NOTE — Assessment & Plan Note (Signed)
Last A1C 08/03/20 6.2%  Plan A1C  Sliding scale coverage  Farixga 10  mg daily

## 2022-06-29 NOTE — ED Notes (Signed)
Patient refused Lasix. Stated "My cardiologist said I don't need to take it all the time. I have some at home and take it. I don't want it." RN explained to patient the purpose of the medication. Patient verbalized understanding and refused. EDP aware

## 2022-06-29 NOTE — Subjective & Objective (Signed)
77 y.o. M with history of CAD s/p PCI LAD, carotid stenosis, pulmonary HTN, CHF with EF 50-55% last echo, CKD, presenting to the ED with SOB. States he has been feeling SOB for the past 6-7 hours, worse with any type of movement . He denies any chest pain. EMS reports hypoxemia on their arrival with sat of 85%. Brought to [facility] for further evaluation

## 2022-06-29 NOTE — ED Triage Notes (Signed)
Pt BIBA to ED SOB x7 hrs ago. Hx CHF and kidney failure. 85% RA, placed on 4L [place] and SpO2 95%. Currently 96% 6L. Given 1 nitro PTA. BGL 560  EMS vitals  176/84 80's  85% RA.

## 2022-06-29 NOTE — H&P (Signed)
History and Physical    BRADELY PESCH [id number] DOB: 16-Jun-1945 DOA: 06/29/2022  DOS: the patient was seen and examined on 06/29/2022  PCP: [name], MD   Patient coming from: Home  I have personally briefly reviewed patient's old medical records in [place]  77 y.o. M with history of CAD s/p PCI LAD, carotid stenosis, pulmonary HTN, CHF with EF 50-55% last echo, CKD, presenting to the ED with SOB. States he has been feeling SOB for the past 6-7 hours, worse with any type of movement . He denies any chest pain. EMS reports hypoxemia on their arrival with sat of 85%. Brought to [facility] for further evaluation   ED Course: T 97.7  153/119 to 168/90 at admission exam. Lb glucose 462  Cr 1.87 (baseline), WBC 11.8 with 88/5/7  Hgb 10.8 (chronic), BNP 243. CXR with progressive ASD c/w CHF. He was given IV lasix in ED. TRH called to admit for continued management of acute on chronic CHF  Review of Systems:  Review of Systems  Constitutional: Negative.   HENT: Negative.    Eyes: Negative.   Respiratory:  Positive for shortness of breath. Negative for cough, sputum production and wheezing.        Reports hearing "gurgling" sounds with inspiration  Cardiovascular:  Negative for chest pain and orthopnea.       Advanced claudication limiting ambulation to 3 feet - he is basically wheelchair bound  Gastrointestinal: Negative.   Genitourinary: Negative.   Musculoskeletal:  Positive for back pain.  Skin: Negative.   Neurological:  Positive for sensory change and weakness.       Decreased feeling in his feet. Poor balance  Endo/Heme/Allergies: Negative.   Psychiatric/Behavioral: Negative.      Past Medical History:  Diagnosis Date   Arthritis    CAD in native artery    s/p stent in 11/18   CHF (congestive heart failure) (HCC)    normal echo in 11/18   CKD (chronic kidney disease)    DDD (degenerative disc disease), cervical    Diabetes mellitus with complication (HCC)     Type II   Diabetic neuropathy (HCC)    Diabetic retinopathy (HCC)    GERD (gastroesophageal reflux disease)    Glaucoma    Hypertension     Past Surgical History:  Procedure Laterality Date   AMPUTATION Right 07/09/2016   Procedure: Right Great Toe Amputation at Metatarsophalangeal Joint;  Surgeon: [name], MD;  Location: [place];  Service: Orthopedics;  Laterality: Right;   AMPUTATION Right 05/28/2018   Procedure: RIGHT SECOND TOE AMPUTATION;  Surgeon: [name], MD;  Location: [place];  Service: Orthopedics;  Laterality: Right;   AMPUTATION Left 08/03/2020   Procedure: LEFT 5TH RAY AMPUTATION;  Surgeon: [name], MD;  Location: [place];  Service: Orthopedics;  Laterality: Left;   BACK SURGERY     4   CARDIAC CATHETERIZATION     CORONARY STENT INTERVENTION N/A 04/06/2017   Procedure: CORONARY STENT INTERVENTION;  Surgeon: Nigel Mormon, MD;  Location: [place] [facility];  Service: Cardiovascular;  Laterality: N/A;   CORONARY STENT INTERVENTION N/A 04/07/2017   Procedure: CORONARY STENT INTERVENTION;  Surgeon: Nigel Mormon, MD;  Location: [place] [facility];  Service: Cardiovascular;  Laterality: N/A;   CYST EXCISION     on Back   ENDOTRACHEAL INTUBATION EMERGENT  08/07/2018       JOINT REPLACEMENT     LAPAROSCOPIC ABDOMINAL EXPLORATION N/A  11/26/2017   Procedure: LAPAROSCOPIC ABDOMINAL EXPLORATION, DRAINAGE OF APPENDICEAL ABCESS. PLACEMENT OF DRAIN;  Surgeon: Alphonsa Overall, MD;  Location: [place];  Service: General;  Laterality: N/A;   RIGHT/LEFT HEART CATH AND CORONARY ANGIOGRAPHY N/A 04/06/2017   Procedure: RIGHT/LEFT HEART CATH AND CORONARY ANGIOGRAPHY;  Surgeon: Nigel Mormon, MD;  Location: [place] [facility];  Service: Cardiovascular;  Laterality: N/A;   TOE SURGERY  05/2018   Left    TOTAL HIP ARTHROPLASTY     Right     Soc Hx - !st marriage ended in divorce. Produced 2 children. He has been with his current spouse for almost 31 years. They  have no children. Worked as a Microbiologist -Estate agent- and Production assistant, radio. Desires to be full code   reports that he quit smoking about 28 years ago. His smoking use included cigarettes. He has a 1.25 pack-year smoking history. He has never used smokeless tobacco. He reports current alcohol use. He reports that he does not use drugs.  No Known Allergies  Family History  Problem Relation Age of Onset   Diabetes Other    Hyperlipidemia Other    Hypertension Other    Stroke Other    Alzheimer's disease Other    Thyroid disease Mother    Diabetes Mellitus II Father    Alzheimer's disease Father     Prior to Admission medications   Medication Sig Start Date End Date Taking? Authorizing Provider  albuterol (PROVENTIL HFA;VENTOLIN HFA) 108 (90 Base) MCG/ACT inhaler Inhale 2 puffs into the lungs every 6 (six) hours as needed for wheezing or shortness of breath. 04/09/17   Barton Dubois, MD  amLODipine (NORVASC) 10 MG tablet Take 1 tablet (10 mg total) by mouth daily. 05/02/22   Patwardhan, Reynold Bowen, MD  aspirin EC 81 MG tablet Take 81 mg by mouth daily.    [provider]  atorvastatin (LIPITOR) 40 MG tablet Take 1 tablet (40 mg total) by mouth daily. 09/11/21   Patwardhan, Reynold Bowen, MD  dicyclomine (BENTYL) 10 MG capsule Take 1 capsule (10 mg total) by mouth 4 (four) times daily -  before meals and at bedtime. 08/15/18   Raiford Noble Latif, DO  docusate sodium (COLACE) 100 MG capsule Take 100 mg by mouth daily.    [provider]  dorzolamide-timolol (COSOPT) 22.3-6.8 MG/ML ophthalmic solution Place 1 drop into both eyes 2 (two) times daily. 11/17/17   [provider]  ferrous sulfate 325 (65 FE) MG tablet Take 325 mg by mouth daily with breakfast.    [provider]  guaiFENesin (MUCINEX PO) Take 1,200 mg by mouth daily.    [provider]  HUMALOG MIX 75/25 KWIKPEN (75-25) 100 UNIT/ML Kwikpen Inject 36 Units into the skin  in the morning and at bedtime. 07/31/20   [provider]  HYDROcodone-acetaminophen (NORCO) 10-325 MG tablet Take 1 tablet by mouth daily.    [provider]  isosorbide-hydrALAZINE (BIDIL) 20-37.5 MG tablet Take 2 tablets by mouth three times daily. 06/09/22   Patwardhan, Reynold Bowen, MD  metoprolol succinate (TOPROL-XL) 100 MG 24 hr tablet Take 1 tablet (100 mg total) by mouth daily. TAKE WITH OR IMMEDIATELY FOLLOWING A MEAL. 09/11/21   Patwardhan, Reynold Bowen, MD  Multiple Vitamin (MULTIVITAMIN WITH MINERALS) TABS tablet Take 1 tablet by mouth daily. 08/16/18   Raiford Noble Latif, DO  nitroGLYCERIN (NITROSTAT) 0.4 MG SL tablet Place 1 tablet (0.4 mg total) under the tongue every 5 (five) minutes  as needed for chest pain. 05/02/22   Patwardhan, Reynold Bowen, MD  torsemide (DEMADEX) 20 MG tablet Take 1 tablet by mouth twice daily. Patient not taking: Reported on 06/13/2022 03/10/22   Nigel Mormon, MD  traZODone (DESYREL) 100 MG tablet Take 100 mg by mouth at bedtime.     [provider]    Physical Exam: Vitals:   06/29/22 0944 06/29/22 0945 06/29/22 1000 06/29/22 1015  BP:  (!) 174/87 (!) 173/87 (!) 165/83  Pulse:  81 74 75  Resp:      Temp: 97.7 F (36.5 C)     TempSrc:      SpO2:  98% 99% 100%  Weight:      Height:        Physical Exam Vitals and nursing note reviewed.  Constitutional:      General: He is in acute distress.     Appearance: He is well-developed. He is obese. He is not ill-appearing.  HENT:     Head: Normocephalic and atraumatic.     Mouth/Throat:     Mouth: Mucous membranes are moist.     Pharynx: Oropharynx is clear.  Eyes:     Extraocular Movements: Extraocular movements intact.     Pupils: Pupils are equal, round, and reactive to light.  Neck:     Thyroid: No thyromegaly.     Vascular: No JVD.  Cardiovascular:     Rate and Rhythm: Normal rate and regular rhythm.     Heart sounds: Normal heart sounds. No murmur heard.     Comments: 2+ radial pulses, 1+ DP pulses both feet. Feet warm to touch Pulmonary:     Effort: Pulmonary effort is normal. No tachypnea or accessory muscle usage.     Breath sounds: Examination of the right-middle field reveals rales. Examination of the left-middle field reveals decreased breath sounds. Examination of the right-lower field reveals rales. Examination of the left-lower field reveals decreased breath sounds and rales. Decreased breath sounds and rales present. No wheezing or rhonchi.  Chest:     Chest wall: No mass or tenderness. There is dullness to percussion.     Comments: Dull to percussion left base Abdominal:     General: Bowel sounds are normal.     Palpations: Abdomen is soft.     Tenderness: There is no guarding or rebound.  Musculoskeletal:     Cervical back: Normal range of motion and neck supple.     Comments: Did not remove socks to exam feet. Both feet warm. No distal peripheral edema. No evidence of venous stasis. No ulceration distal LE or ankles.   Skin:    General: Skin is warm and dry.  Neurological:     Mental Status: He is alert and oriented to person, place, and time.     Cranial Nerves: No cranial nerve deficit.  Psychiatric:        Mood and Affect: Mood normal.        Behavior: Behavior normal.      Labs on Admission: I have personally reviewed following labs and imaging studies  CBC: Recent Labs  Lab 06/29/22 0454  WBC 11.8*  NEUTROABS 10.4*  HGB 10.8*  HCT 32.8*  MCV 105.8*  PLT 99991111   Basic Metabolic Panel: Recent Labs  Lab 06/29/22 0454  NA 134*  K 3.9  CL 101  CO2 21*  GLUCOSE 462*  BUN 21  CREATININE 1.87*  CALCIUM 8.8*   GFR: Estimated Creatinine Clearance: 43.3 mL/min (A) (by  C-G formula based on SCr of 1.87 mg/dL (H)). Liver Function Tests: No results for input(s): "AST", "ALT", "ALKPHOS", "BILITOT", "PROT", "ALBUMIN" in the last 168 hours. No results for input(s): "LIPASE", "AMYLASE" in the last 168 hours. No  results for input(s): "AMMONIA" in the last 168 hours. Coagulation Profile: No results for input(s): "INR", "PROTIME" in the last 168 hours. Cardiac Enzymes: No results for input(s): "CKTOTAL", "CKMB", "CKMBINDEX", "TROPONINI" in the last 168 hours. BNP (last 3 results) No results for input(s): "PROBNP" in the last 8760 hours. HbA1C: No results for input(s): "HGBA1C" in the last 72 hours. CBG: No results for input(s): "GLUCAP" in the last 168 hours. Lipid Profile: No results for input(s): "CHOL", "HDL", "LDLCALC", "TRIG", "CHOLHDL", "LDLDIRECT" in the last 72 hours. Thyroid Function Tests: No results for input(s): "TSH", "T4TOTAL", "FREET4", "T3FREE", "THYROIDAB" in the last 72 hours. Anemia Panel: No results for input(s): "VITAMINB12", "FOLATE", "FERRITIN", "TIBC", "IRON", "RETICCTPCT" in the last 72 hours. Urine analysis:    Component Value Date/Time   COLORURINE YELLOW 08/15/2018 0911   APPEARANCEUR HAZY (A) 08/15/2018 0911   LABSPEC 1.021 08/15/2018 0911   PHURINE 5.0 08/15/2018 0911   GLUCOSEU 150 (A) 08/15/2018 0911   HGBUR SMALL (A) 08/15/2018 0911   BILIRUBINUR NEGATIVE 08/15/2018 0911   KETONESUR 5 (A) 08/15/2018 0911   PROTEINUR 100 (A) 08/15/2018 0911   UROBILINOGEN 1.0 02/13/2015 1934   NITRITE NEGATIVE 08/15/2018 0911   LEUKOCYTESUR NEGATIVE 08/15/2018 0911    Radiological Exams on Admission: I have personally reviewed images DG Chest Port 1 View  Result Date: 06/29/2022 CLINICAL DATA:  Shortness of breath. EXAM: PORTABLE CHEST 1 VIEW COMPARISON:  08/15/2018 FINDINGS: 0545 hours. Low volume film. The cardio pericardial silhouette is enlarged. Diffuse interstitial and mid/basilar lung airspace disease is progressive in the interval. Probable small bilateral pleural effusions. No evidence for pneumothorax. Telemetry leads overlie the chest. IMPRESSION: Interval progression of diffuse interstitial and mid/basilar lung airspace disease with probable small bilateral  pleural effusions. Imaging appearance suggest pulmonary edema. Electronically Signed   By: Misty Stanley M.D.   On: 06/29/2022 05:53    EKG: I have personally reviewed EKG: sinus rhythm. Repolarization suggestive of ischemia  Assessment/Plan Principal Problem:   Acute on chronic congestive heart failure (HCC) Active Problems:   Essential hypertension   Type 2 diabetes mellitus with stage 3b chronic kidney disease, with long-term current use of insulin (HCC)   PAD (peripheral artery disease) (HCC)    Assessment and Plan: * Acute on chronic congestive heart failure ([place]) Patient's last ECHO 08/08/18 eith preserved EF at 50-55%, DD not assessed. Echo 12/22/17 EF 50-55%, Grade II DD. He has not been taking diuertic. Presents with SOB, rales on EDP exam, BNP 243. Troponin #1 18, # 2 90. Rise likely to represent strain. No acute EKG changes, no c/o chest pain. Given IV lasix in ED.  Plan Obs admit  Trend troponins -   Continue cardiac meds  Resume torsemide 20 mg BID  ECHO  PAD (peripheral artery disease) ([place]) Patient reports this is a known problem and that he has started an evalulation but has been hesitant to proceed  Plan Continue outpatient evaluation with vascular surgery, I.e. angiogram, etc.  Type 2 diabetes mellitus with stage 3b chronic kidney disease, with long-term current use of insulin (HCC) Last A1C 08/03/20 6.2%  Plan A1C  Sliding scale coverage  Farixga 10  mg daily  Essential hypertension BP elevated at admission - improved over ED course.  Plan Continue home  meds including resumption of torsemide       DVT prophylaxis: Lovenox Code Status: Full Code Family Communication: wife present during interview and exam. No questions  Disposition Plan: home 24-48 hours  Consults called: none  Admission status: Inpatient, Telemetry bed   Adella Hare, MD [facility] 06/29/2022, 11:15 AM

## 2022-06-29 NOTE — Assessment & Plan Note (Signed)
Continue blood pressure control with amlodipine, metoprolol, and Bidil Diuresis with IV furosemide

## 2022-06-29 NOTE — ED Notes (Signed)
ED TO INPATIENT HANDOFF REPORT  ED Nurse Name and Phone #: Caryl Pina [phone]  S Name/Age/Gender David [name] 77 y.o. male Room/Bed: 043C/043C  Code Status   Code Status: Full Code  Home/SNF/Other Home Patient oriented to: self, place, time, and situation Is this baseline? Yes   Triage Complete: Triage complete  Chief Complaint Acute on chronic diastolic (congestive) heart failure ([place]) [I50.33]  Triage Note Pt BIBA to ED SOB x7 hrs ago. Hx CHF and kidney failure. 85% RA, placed on 4L [place] and SpO2 95%. Currently 96% 6L. Given 1 nitro PTA. BGL 560  EMS vitals  176/84 80's  85% RA.   Allergies No Known Allergies  Level of Care/Admitting Diagnosis ED Disposition     ED Disposition  Admit   Condition  --   Comment  H[facility] Area: [facility] [[id number]]  Level of Care: Telemetry Cardiac [103]  May admit patient to [facility] or [facility] if equivalent level of care is available:: Yes  Covid Evaluation: Confirmed COVID Negative  Diagnosis: Acute on chronic diastolic (congestive) heart failure [facility]) [id number]  Admitting Physician: Vianne Bulls [id number]  Attending Physician: Vianne Bulls [id number]  Certification:: I certify this patient will need inpatient services for at least 2 midnights  Estimated Length of Stay: 3          B Medical/Surgery History Past Medical History:  Diagnosis Date   Arthritis    CAD in native artery    s/p stent in 11/18   CHF (congestive heart failure) ([place])    normal echo in 11/18   CKD (chronic kidney disease)    DDD (degenerative disc disease), cervical    Diabetes mellitus with complication (HCC)    Type II   Diabetic neuropathy ([place])    Diabetic retinopathy ([place])    GERD (gastroesophageal reflux disease)    Glaucoma    Hypertension    Past Surgical History:  Procedure Laterality Date   AMPUTATION Right 07/09/2016   Procedure: Right Great Toe Amputation at [place];  Surgeon:  [name], MD;  Location: [place];  Service: Orthopedics;  Laterality: Right;   AMPUTATION Right 05/28/2018   Procedure: RIGHT SECOND TOE AMPUTATION;  Surgeon: [name], MD;  Location: [place];  Service: Orthopedics;  Laterality: Right;   AMPUTATION Left 08/03/2020   Procedure: LEFT 5TH RAY AMPUTATION;  Surgeon: [name], MD;  Location: [place];  Service: Orthopedics;  Laterality: Left;   BACK SURGERY     4   CARDIAC CATHETERIZATION     CORONARY STENT INTERVENTION N/A 04/06/2017   Procedure: CORONARY STENT INTERVENTION;  Surgeon: Nigel Mormon, MD;  Location: [place] [facility];  Service: Cardiovascular;  Laterality: N/A;   CORONARY STENT INTERVENTION N/A 04/07/2017   Procedure: CORONARY STENT INTERVENTION;  Surgeon: Nigel Mormon, MD;  Location: [place] [facility];  Service: Cardiovascular;  Laterality: N/A;   CYST EXCISION     on Back   ENDOTRACHEAL INTUBATION EMERGENT  08/07/2018       JOINT REPLACEMENT     LAPAROSCOPIC ABDOMINAL EXPLORATION N/A 11/26/2017   Procedure: LAPAROSCOPIC ABDOMINAL EXPLORATION, DRAINAGE OF APPENDICEAL ABCESS. PLACEMENT OF DRAIN;  Surgeon: Alphonsa Overall, MD;  Location: [place];  Service: General;  Laterality: N/A;   RIGHT/LEFT HEART CATH AND CORONARY ANGIOGRAPHY N/A 04/06/2017   Procedure: RIGHT/LEFT HEART CATH AND CORONARY ANGIOGRAPHY;  Surgeon: Nigel Mormon, MD;  Location: [place] [facility];  Service: Cardiovascular;  Laterality: N/A;  TOE SURGERY  05/2018   Left    TOTAL HIP ARTHROPLASTY     Right      A IV Location/Drains/Wounds Patient Lines/Drains/Airways Status     Active Line/Drains/Airways     Name Placement date Placement time Site Days   Peripheral IV 06/29/22 20 G Left Antecubital 06/29/22  0525  Antecubital  less than 1   Negative Pressure Wound Therapy Toe (Comment  which one) Anterior;Right 08/03/20  1102  --  695   Incision (Closed) 05/28/18 Foot 05/28/18  1006  -- 1493   Incision (Closed) 08/03/20 Foot Left  08/03/20  1109  -- 695   Wound / Incision (Open or Dehisced) 08/07/18 Diabetic ulcer Foot Right 08/07/18  1200  Foot  1422            Intake/Output Last 24 hours  Intake/Output Summary (Last 24 hours) at 06/29/2022 1453 Last data filed at 06/29/2022 1111 Gross per 24 hour  Intake --  Output 800 ml  Net -800 ml    Labs/Imaging Results for orders placed or performed during the hospital encounter of 06/29/22 (from the past 48 hour(s))  CBC with Differential     Status: Abnormal   Collection Time: 06/29/22  4:54 AM  Result Value Ref Range   WBC 11.8 (H) 4.0 - 10.5 K/uL   RBC 3.10 (L) 4.22 - 5.81 MIL/uL   Hemoglobin 10.8 (L) 13.0 - 17.0 g/dL   HCT 32.8 (L) 39.0 - 52.0 %   MCV 105.8 (H) 80.0 - 100.0 fL   MCH 34.8 (H) 26.0 - 34.0 pg   MCHC 32.9 30.0 - 36.0 g/dL   RDW 12.4 11.5 - 15.5 %   Platelets 257 150 - 400 K/uL   nRBC 0.0 0.0 - 0.2 %   Neutrophils Relative % 88 %   Neutro Abs 10.4 (H) 1.7 - 7.7 K/uL   Lymphocytes Relative 5 %   Lymphs Abs 0.6 (L) 0.7 - 4.0 K/uL   Monocytes Relative 7 %   Monocytes Absolute 0.8 0.1 - 1.0 K/uL   Eosinophils Relative 0 %   Eosinophils Absolute 0.0 0.0 - 0.5 K/uL   Basophils Relative 0 %   Basophils Absolute 0.0 0.0 - 0.1 K/uL   Immature Granulocytes 0 %   Abs Immature Granulocytes 0.05 0.00 - 0.07 K/uL    Comment: Performed at [facility], [place] [address], [place], [place] [id number]  Basic metabolic panel     Status: Abnormal   Collection Time: 06/29/22  4:54 AM  Result Value Ref Range   Sodium 134 (L) 135 - 145 mmol/L   Potassium 3.9 3.5 - 5.1 mmol/L   Chloride 101 98 - 111 mmol/L   CO2 21 (L) 22 - 32 mmol/L   Glucose, Bld 462 (H) 70 - 99 mg/dL    Comment: Glucose reference range applies only to samples taken after fasting for at least 8 hours.   BUN 21 8 - 23 mg/dL   Creatinine, Ser 1.87 (H) 0.61 - 1.24 mg/dL   Calcium 8.8 (L) 8.9 - 10.3 mg/dL   GFR, Estimated 37 (L) >60 mL/min    Comment: (NOTE) Calculated using the  CKD-EPI Creatinine Equation (2021)    Anion gap 12 5 - 15    Comment: Performed at [facility] [address] [place], [place] [address]  Brain natriuretic peptide     Status: Abnormal   Collection Time: 06/29/22  4:54 AM  Result Value Ref Range  B Natriuretic Peptide 243.4 (H) 0.0 - 100.0 pg/mL    Comment: Performed at [facility], [place] [address], [place], [place] [address]  Troponin I (High Sensitivity)     Status: Abnormal   Collection Time: 06/29/22  4:54 AM  Result Value Ref Range   Troponin I (High Sensitivity) 18 (H) <18 ng/L    Comment: (NOTE) Elevated high sensitivity troponin I (hsTnI) values and significant  changes across serial measurements may suggest ACS but many other  chronic and acute conditions are known to elevate hsTnI results.  Refer to the "Links" section for chest pain algorithms and additional  guidance. Performed at [facility], [place] [address] [place], [place] [address]   Resp panel by RT-PCR (RSV, Flu A&B, Covid) Anterior Nasal Swab     Status: None   Collection Time: 06/29/22  4:55 AM   Specimen: Anterior Nasal Swab  Result Value Ref Range   SARS Coronavirus 2 by RT PCR NEGATIVE NEGATIVE   Influenza A by PCR NEGATIVE NEGATIVE   Influenza B by PCR NEGATIVE NEGATIVE    Comment: (NOTE) The Xpert Xpress SARS-CoV-2/FLU/RSV plus assay is intended as an aid in the diagnosis of influenza from Nasopharyngeal swab specimens and should not be used as a sole basis for treatment. Nasal washings and aspirates are unacceptable for Xpert Xpress SARS-CoV-2/FLU/RSV testing.  Fact Sheet for Patients: EntrepreneurPulse.com.au  Fact Sheet for Healthcare Providers: IncredibleEmployment.be  This test is not yet approved or cleared by the [place] FDA and has been authorized for detection and/or diagnosis of SARS-CoV-2 by FDA under an Emergency Use Authorization (EUA). This EUA will remain in effect  (meaning this test can be used) for the duration of the COVID-19 declaration under Section 564(b)(1) of the Act, 21 U.S.C. section 360bbb-3(b)(1), unless the authorization is terminated or revoked.     Resp Syncytial Virus by PCR NEGATIVE NEGATIVE    Comment: (NOTE) Fact Sheet for Patients: EntrepreneurPulse.com.au  Fact Sheet for Healthcare Providers: IncredibleEmployment.be  This test is not yet approved or cleared by the [place] FDA and has been authorized for detection and/or diagnosis of SARS-CoV-2 by FDA under an Emergency Use Authorization (EUA). This EUA will remain in effect (meaning this test can be used) for the duration of the COVID-19 declaration under Section 564(b)(1) of the Act, 21 U.S.C. section 360bbb-3(b)(1), unless the authorization is terminated or revoked.  Performed at [facility], [place] [address], [place], [place] [address]   Troponin I (High Sensitivity)     Status: Abnormal   Collection Time: 06/29/22  7:50 AM  Result Value Ref Range   Troponin I (High Sensitivity) 90 (H) <18 ng/L    Comment: RESULT CALLED TO, READ BACK BY AND VERIFIED WITH J,BLUE RN @0919$  06/29/22 E,[place] DELTA CHECK NOTED (NOTE) Elevated high sensitivity troponin I (hsTnI) values and significant  changes across serial measurements may suggest ACS but many other  chronic and acute conditions are known to elevate hsTnI results.  Refer to the "Links" section for chest pain algorithms and additional  guidance. Performed at [facility], [place] [address] [place], [place] [address]   Hemoglobin A1c     Status: Abnormal   Collection Time: 06/29/22 11:30 AM  Result Value Ref Range   Hgb A1c MFr Bld 8.6 (H) 4.8 - 5.6 %    Comment: (NOTE) Pre diabetes:          5.7%-6.4%  Diabetes:              >  6.4%  Glycemic control for   <7.0% adults with diabetes    Mean Plasma Glucose 200.12 mg/dL    Comment: Performed at [facility] [address] [place], [place] [address]  CBG monitoring, ED     Status: Abnormal   Collection Time: 06/29/22  1:12 PM  Result Value Ref Range   Glucose-Capillary 372 (H) 70 - 99 mg/dL    Comment: Glucose reference range applies only to samples taken after fasting for at least 8 hours.   DG Chest Port 1 View  Result Date: 06/29/2022 CLINICAL DATA:  Shortness of breath. EXAM: PORTABLE CHEST 1 VIEW COMPARISON:  08/15/2018 FINDINGS: 0545 hours. Low volume film. The cardio pericardial silhouette is enlarged. Diffuse interstitial and mid/basilar lung airspace disease is progressive in the interval. Probable small bilateral pleural effusions. No evidence for pneumothorax. Telemetry leads overlie the chest. IMPRESSION: Interval progression of diffuse interstitial and mid/basilar lung airspace disease with probable small bilateral pleural effusions. Imaging appearance suggest pulmonary edema. Electronically Signed   By: Misty Stanley M.D.   On: 06/29/2022 05:53    Pending Labs Unresulted Labs (From admission, onward)     Start     Ordered   07/06/22 0500  Creatinine, serum  (enoxaparin (LOVENOX)    CrCl >/= 30 ml/min)  Weekly,   R     Comments: while on enoxaparin therapy    06/29/22 1129   06/30/22 [id number]  Basic metabolic panel  Daily,   R       Comments: As Scheduled for 5 days    06/29/22 1129            Vitals/Pain Today's Vitals   06/29/22 1218 06/29/22 1230 06/29/22 1304 06/29/22 1315  BP:  (!) 163/85 (!) 165/77 (!) 177/87  Pulse:  65 79 79  Resp: 20   20  Temp:      TempSrc:      SpO2: 95% 97%  95%  Weight:      Height:      PainSc:        Isolation Precautions Airborne and Contact precautions  Medications Medications  furosemide (LASIX) 10 MG/ML injection (  Canceled Entry 06/29/22 0938)  aspirin EC tablet 81 mg (has no administration in time range)  HYDROcodone-acetaminophen (NORCO) 10-325 MG per tablet 1 tablet (has no administration in time range)   amLODipine (NORVASC) tablet 10 mg (10 mg Oral Given 06/29/22 1304)  atorvastatin (LIPITOR) tablet 40 mg (40 mg Oral Given 06/29/22 1306)  isosorbide-hydrALAZINE (BIDIL) 20-37.5 MG per tablet 2 tablet (2 tablets Oral Given 06/29/22 1306)  metoprolol succinate (TOPROL-XL) 24 hr tablet 100 mg (100 mg Oral Given 06/29/22 1304)  nitroGLYCERIN (NITROSTAT) SL tablet 0.4 mg (has no administration in time range)  torsemide (DEMADEX) tablet 20 mg (20 mg Oral Given 06/29/22 1304)  traZODone (DESYREL) tablet 100 mg (has no administration in time range)  dicyclomine (BENTYL) capsule 10 mg (10 mg Oral Given 06/29/22 1306)  docusate sodium (COLACE) capsule 100 mg (100 mg Oral Given 06/29/22 1304)  ferrous sulfate tablet 325 mg (has no administration in time range)  albuterol (PROVENTIL) (2.5 MG/3ML) 0.083% nebulizer solution 2.5 mg (has no administration in time range)  dorzolamide-timolol (COSOPT) 2-0.5 % ophthalmic solution 1 drop (has no administration in time range)  sodium chloride flush (NS) 0.9 % injection 3 mL (3 mLs Intravenous Given 06/29/22 1307)  sodium chloride flush (NS) 0.9 % injection 3 mL (has no administration in time range)  0.9 %  sodium chloride infusion (has no administration in time range)  acetaminophen (TYLENOL) tablet 650 mg (has no administration in time range)  ondansetron (ZOFRAN) injection 4 mg (has no administration in time range)  enoxaparin (LOVENOX) injection 40 mg (has no administration in time range)  insulin aspart (novoLOG) injection 0-20 Units (20 Units Subcutaneous Given 06/29/22 1322)  furosemide (LASIX) injection 40 mg (40 mg Intravenous Given 06/29/22 [id number])    Mobility walks       R Recommendations: See Admitting Provider Note  Report given to:   Additional Notes: n/a

## 2022-06-29 NOTE — Assessment & Plan Note (Signed)
Patient reports this is a known problem and that he has started an evalulation but has been hesitant to proceed  Plan Continue outpatient evaluation with vascular surgery, I.e. angiogram, etc.

## 2022-06-29 NOTE — Assessment & Plan Note (Deleted)
Patient's last ECHO 08/08/18 eith preserved EF at 50-55%, DD not assessed. Echo 12/22/17 EF 50-55%, Grade II DD. He has not been taking diuertic. Presents with SOB, rales on EDP exam, BNP 243. Troponin #1 18, # 2 90. Rise likely to represent strain. No acute EKG changes, no c/o chest pain. Given IV lasix in ED.  Plan Obs admit  Trend troponins -   Continue cardiac meds  Resume torsemide 20 mg BID  ECHO

## 2022-06-29 NOTE — ED Notes (Addendum)
Troponin 90, critical lab reported from lab. paige Primary RN notifed.

## 2022-06-29 NOTE — ED Provider Notes (Signed)
[facility] Provider Note   CSN: [id number] Arrival date & time: 06/29/22  0445     History  Chief Complaint  Patient presents with   Shortness of Breath    David [name] is a 77 y.o. male.  The history is provided by the patient and medical records.  Shortness of Breath   77 y.o. M with history of CAD s/p PCI LAD, carotid stenosis, pulmonary HTN, CHF with EF 50-55% last echo, CKD, presenting to the ED with SOB.  States he has been feeling SOB for the past 6-7 hours, worse with any type of movement.  States it makes him feel incredibly "winded" and fatigued.  He denies any real chest pain.  Has had some chills but denies known fever.  No sick contacts reported.  He is followed closely by cardiology Dr. Virgina Jock and has had medications increased recently to try to help with fluid balance.  Was hypoxic with EMS, 85% on RA, improved to 90's with 4L. He is not generally oxygen dependent.   Home Medications Prior to Admission medications   Medication Sig Start Date End Date Taking? Authorizing Provider  albuterol (PROVENTIL HFA;VENTOLIN HFA) 108 (90 Base) MCG/ACT inhaler Inhale 2 puffs into the lungs every 6 (six) hours as needed for wheezing or shortness of breath. 04/09/17   Barton Dubois, MD  amLODipine (NORVASC) 10 MG tablet Take 1 tablet (10 mg total) by mouth daily. 05/02/22   Patwardhan, Reynold Bowen, MD  aspirin EC 81 MG tablet Take 81 mg by mouth daily.    [provider]  atorvastatin (LIPITOR) 40 MG tablet Take 1 tablet (40 mg total) by mouth daily. 09/11/21   Patwardhan, Reynold Bowen, MD  dicyclomine (BENTYL) 10 MG capsule Take 1 capsule (10 mg total) by mouth 4 (four) times daily -  before meals and at bedtime. 08/15/18   Raiford Noble Latif, DO  docusate sodium (COLACE) 100 MG capsule Take 100 mg by mouth daily.    [provider]  dorzolamide-timolol (COSOPT) 22.3-6.8 MG/ML ophthalmic solution Place 1 drop into both  eyes 2 (two) times daily. 11/17/17   [provider]  ferrous sulfate 325 (65 FE) MG tablet Take 325 mg by mouth daily with breakfast.    [provider]  guaiFENesin (MUCINEX PO) Take 1,200 mg by mouth daily.    [provider]  HUMALOG MIX 75/25 KWIKPEN (75-25) 100 UNIT/ML Kwikpen Inject 36 Units into the skin in the morning and at bedtime. 07/31/20   [provider]  HYDROcodone-acetaminophen (NORCO) 10-325 MG tablet Take 1 tablet by mouth daily.    [provider]  isosorbide-hydrALAZINE (BIDIL) 20-37.5 MG tablet Take 2 tablets by mouth three times daily. 06/09/22   Patwardhan, Reynold Bowen, MD  metoprolol succinate (TOPROL-XL) 100 MG 24 hr tablet Take 1 tablet (100 mg total) by mouth daily. TAKE WITH OR IMMEDIATELY FOLLOWING A MEAL. 09/11/21   Patwardhan, Reynold Bowen, MD  Multiple Vitamin (MULTIVITAMIN WITH MINERALS) TABS tablet Take 1 tablet by mouth daily. 08/16/18   Sheikh, Georgina Quint Latif, DO  nitroGLYCERIN (NITROSTAT) 0.4 MG SL tablet Place 1 tablet (0.4 mg total) under the tongue every 5 (five) minutes as needed for chest pain. 05/02/22   Patwardhan, Reynold Bowen, MD  torsemide (DEMADEX) 20 MG tablet Take 1 tablet by mouth twice daily. Patient not taking: Reported on 06/13/2022 03/10/22   Nigel Mormon, MD  traZODone (DESYREL) 100 MG tablet Take 100 mg by mouth at  bedtime.     [provider]      Allergies    Patient has no known allergies.    Review of Systems   Review of Systems  Respiratory:  Positive for shortness of breath.   Cardiovascular:  Positive for leg swelling.  All other systems reviewed and are negative.   Physical Exam Updated Vital Signs BP (!) 172/42   Pulse 87   Temp 97.8 F (36.6 C) (Oral)   Resp 18   Ht 6' 1"$  (1.854 m)   Wt 104.3 kg   SpO2 99%   BMI 30.34 kg/m   Physical Exam Vitals and nursing note reviewed.  Constitutional:      Appearance: He is well-developed.  HENT:     Head: Normocephalic and  atraumatic.  Eyes:     Conjunctiva/sclera: Conjunctivae normal.     Pupils: Pupils are equal, round, and reactive to light.  Cardiovascular:     Rate and Rhythm: Normal rate and regular rhythm.     Heart sounds: Normal heart sounds.  Pulmonary:     Effort: Pulmonary effort is normal.     Breath sounds: Rales present. No wheezing or rhonchi.     Comments: 4L supplemental O2 in use, gets winded if moving around on stretcher too much Abdominal:     General: Bowel sounds are normal.     Palpations: Abdomen is soft.  Musculoskeletal:        General: Normal range of motion.     Cervical back: Normal range of motion.     Comments: Bilateral ankle edema noted, grossly symmetric  Skin:    General: Skin is warm and dry.  Neurological:     Mental Status: He is alert and oriented to person, place, and time.     ED Results / Procedures / Treatments   Labs (all labs ordered are listed, but only abnormal results are displayed) Labs Reviewed  CBC WITH DIFFERENTIAL/PLATELET - Abnormal; Notable for the following components:      Result Value   WBC 11.8 (*)    RBC 3.10 (*)    Hemoglobin 10.8 (*)    HCT 32.8 (*)    MCV 105.8 (*)    MCH 34.8 (*)    Neutro Abs 10.4 (*)    Lymphs Abs 0.6 (*)    All other components within normal limits  BASIC METABOLIC PANEL - Abnormal; Notable for the following components:   Sodium 134 (*)    CO2 21 (*)    Glucose, Bld 462 (*)    Creatinine, Ser 1.87 (*)    Calcium 8.8 (*)    GFR, Estimated 37 (*)    All other components within normal limits  BRAIN NATRIURETIC PEPTIDE - Abnormal; Notable for the following components:   B Natriuretic Peptide 243.4 (*)    All other components within normal limits  TROPONIN I (HIGH SENSITIVITY) - Abnormal; Notable for the following components:   Troponin I (High Sensitivity) 18 (*)    All other components within normal limits  RESP PANEL BY RT-PCR (RSV, FLU A&B, COVID)  [id number]    EKG EKG  Interpretation  Date/Time:  Sunday June 29 2022 04:47:07 EST Ventricular Rate:  89 PR Interval:    QRS Duration: 90 QT Interval:  343 QTC Calculation: 418 R Axis:   65 Text Interpretation: Sinus rhythm Repol abnrm, severe global ischemia (LM/MVD) Confirmed by Orpah Greek [phone]) on 06/29/2022 5:52:09 AM  Radiology DG Chest [facility] 1 View  Result  Date: 06/29/2022 CLINICAL DATA:  Shortness of breath. EXAM: PORTABLE CHEST 1 VIEW COMPARISON:  08/15/2018 FINDINGS: 0545 hours. Low volume film. The cardio pericardial silhouette is enlarged. Diffuse interstitial and mid/basilar lung airspace disease is progressive in the interval. Probable small bilateral pleural effusions. No evidence for pneumothorax. Telemetry leads overlie the chest. IMPRESSION: Interval progression of diffuse interstitial and mid/basilar lung airspace disease with probable small bilateral pleural effusions. Imaging appearance suggest pulmonary edema. Electronically Signed   By: Misty Stanley M.D.   On: 06/29/2022 05:53    Procedures Procedures    Medications Ordered in ED Medications  furosemide (LASIX) injection 40 mg (has no administration in time range)    ED Course/ Medical Decision Making/ A&P                             Medical Decision Making Amount and/or Complexity of Data Reviewed Labs: ordered. Radiology: ordered and independent interpretation performed. ECG/medicine tests: ordered and independent interpretation performed.  Risk Prescription drug management. Decision regarding hospitalization.  77 year old male presenting to the ED with shortness of breath over the past several hours.  Does have history of CHF, reports worsening DOE.  Has also had some increased lower extremity edema.  Afebrile, nontoxic in appearance here.  He is currently requiring 4 L supplemental oxygen, is not generally oxygen dependent.  He does have some pitting edema of the ankles bilaterally, grossly symmetric.  He  does seem to get winded when having to move around or shift on the stretcher.  Concern for likely CHF.  EKG, labs, RVP, chest x-ray.  Anticipate admission given his new oxygen requirement.  Labs as above--minimal leukocytosis.  Does have some hyperglycemia but maintains normal anion gap.  Renal function appears around baseline.  Troponin equivocal at 18.  BNP 243.  CXR with findings of pulmonary edema.  Given dose of lasix.  He will require admission.  Discussed with hospitalist, Dr. Myna Hidalgo-- will admit for onoing care.  Final Clinical Impression(s) / ED Diagnoses Final diagnoses:  Acute pulmonary edema [facility])    Rx / DC Orders ED Discharge Orders     None         Larene Pickett, PA-C 06/29/22 0617    Orpah Greek, MD 06/29/22 [phone]

## 2022-06-29 NOTE — ED Notes (Signed)
Patient refused to get into a gown on arrival.

## 2022-06-30 ENCOUNTER — Inpatient Hospital Stay (HOSPITAL_COMMUNITY): Payer: Medicare Other

## 2022-06-30 DIAGNOSIS — I10 Essential (primary) hypertension: Secondary | ICD-10-CM

## 2022-06-30 DIAGNOSIS — N1832 Chronic kidney disease, stage 3b: Secondary | ICD-10-CM | POA: Diagnosis not present

## 2022-06-30 DIAGNOSIS — I5033 Acute on chronic diastolic (congestive) heart failure: Secondary | ICD-10-CM

## 2022-06-30 DIAGNOSIS — E1169 Type 2 diabetes mellitus with other specified complication: Secondary | ICD-10-CM

## 2022-06-30 DIAGNOSIS — E785 Hyperlipidemia, unspecified: Secondary | ICD-10-CM

## 2022-06-30 DIAGNOSIS — I739 Peripheral vascular disease, unspecified: Secondary | ICD-10-CM

## 2022-06-30 DIAGNOSIS — E669 Obesity, unspecified: Secondary | ICD-10-CM

## 2022-06-30 LAB — BASIC METABOLIC PANEL WITH GFR
Anion gap: 7 (ref 5–15)
BUN: 21 mg/dL (ref 8–23)
CO2: 26 mmol/L (ref 22–32)
Calcium: 8.7 mg/dL — ABNORMAL LOW (ref 8.9–10.3)
Chloride: 103 mmol/L (ref 98–111)
Creatinine, Ser: 2.09 mg/dL — ABNORMAL HIGH (ref 0.61–1.24)
GFR, Estimated: 32 mL/min — ABNORMAL LOW
Glucose, Bld: 124 mg/dL — ABNORMAL HIGH (ref 70–99)
Potassium: 3.6 mmol/L (ref 3.5–5.1)
Sodium: 136 mmol/L (ref 135–145)

## 2022-06-30 LAB — ECHOCARDIOGRAM COMPLETE
AR max vel: 2.23 cm2
AV Area VTI: 2.18 cm2
AV Area mean vel: 2.07 cm2
AV Mean grad: 4 mmHg
AV Peak grad: 7 mmHg
Ao pk vel: 1.33 m/s
Area-P 1/2: 4.06 cm2
Calc EF: 52.3 %
Height: 73 in
MV M vel: 2.5 m/s
MV Peak grad: 25 mmHg
S' Lateral: 3.7 cm
Single Plane A2C EF: 47.6 %
Single Plane A4C EF: 54.5 %
Weight: 3703.73 oz

## 2022-06-30 LAB — GLUCOSE, CAPILLARY
Glucose-Capillary: 128 mg/dL — ABNORMAL HIGH (ref 70–99)
Glucose-Capillary: 164 mg/dL — ABNORMAL HIGH (ref 70–99)
Glucose-Capillary: 168 mg/dL — ABNORMAL HIGH (ref 70–99)
Glucose-Capillary: 144 mg/dL — ABNORMAL HIGH (ref 70–99)

## 2022-06-30 MED ORDER — PERFLUTREN LIPID MICROSPHERE
1.0000 mL | INTRAVENOUS | Status: AC | PRN
  Administered 2022-06-30: 2 mL via INTRAVENOUS

## 2022-06-30 MED ORDER — FUROSEMIDE 10 MG/ML IJ SOLN
60.0000 mg | Freq: Two times a day (BID) | INTRAMUSCULAR | Status: DC
  Administered 2022-06-30 – 2022-07-02 (×6): 60 mg via INTRAVENOUS
  Filled 2022-06-30 (×6): qty 6

## 2022-06-30 NOTE — Assessment & Plan Note (Signed)
Echocardiogram with preserved LV systolic function with EF 50 to 55%, mild LVH, RV systolic function preserved, mild enlarged RV. RVSP 70.1., LA with mild dilatation, no significant valvular disease.   Pulmonary hypertension Acute on chronic core pulmonale.   Urine output is [id number] Systolic blood pressure 123456 to 130 mmHg.   Continue diuresis with furosemide IV 60 mg q12.  After load reduction with Bidil and continue metoprolol.  Blood pressure control with amlodipine.  Hold on RAS inhibition until renal function more stable.

## 2022-06-30 NOTE — Assessment & Plan Note (Addendum)
Renal function with serum cr at 2,0 with K at 3,6 and serum bicarbonate at 26, Plan to contin diuresis with IV furosemide to further target a negative fluid balance.  Follow up renal function in am.   Anemia of chronic renal disease, with stable Hgb

## 2022-06-30 NOTE — Progress Notes (Signed)
Progress Note   Patient: David [name] [id number] DOB: 1945/11/18 DOA: 06/29/2022     1 DOS: the patient was seen and examined on 06/30/2022   Brief hospital course: David [name] was admitted to the hospital with the working diagnosis of decompensated heart failure.   77 yo male with the past medical history of coronary artery diease, heart failure, pulmonary hypertension, and CKD who presented with dyspnea. He discontinue torsemide 4 weeks ago due to polyuria. Reported drinking about a gallon of orange juice the the prior.  He reported acute onset of dyspnea, he called EMS and he was found hypoxemic with 02 saturation 85%. He was placed on supplemental 02 per [place] and transported to the ED. On his initial physical examination his blood pressure was 153/119, 174/87, HR 81 and 02 saturation 98%, lungs with bilateral rales, decreased breath sounds bilaterally, heart with S1 and S2 present and rhythmic with no gallops, rubs or murmurs, abdomen not distended and no lower extremity edema.   Na 134, K 3,9 CL 101, bicarbonate 21, glucose 462, bun 21 cr 1,87  BNP 243,4 High sensitive troponin 18 and 90  Wbc 11.8 hgb 10,8 plt 257  Sars covid 19 negative   Chest radiograph with mild cardiomegaly, with bilateral hilar vascular congestion and bilateral interstitial symmetrical infiltrates.   EKG 89 bpm, normal axis, normal intervals, sinus rhythm, with no significant ST changes, new negative T wave lead II, III, AvF, V4 to V6.   Patient was placed on furosemide for diuresis.   Assessment and Plan: * Acute on chronic diastolic CHF (congestive heart failure) (HCC) Echocardiogram from 2020 with preserved LV systolic function.  Pending echo report for today.   Urine output is [id number] ml Systolic blood pressure [id number] to 114 mmHg.   Plan to continue diuresis with furosemide IV 60 mg q12.  After load reduction with Bidil and continue metoprolol.  Blood pressure control with amlodipine.  Hold on RAS  inhibition until renal function more stable.   Essential hypertension Continue blood pressure control with amlodipine, metoprolol, and Bidil Diuresis with IV furosemide   Stage 3b chronic kidney disease ([place]) Renal function with serum cr at 2,0 with K at 3,6 and serum bicarbonate at 26, Plan to contin diuresis with IV furosemide to further target a negative fluid balance.  Follow up renal function in am.   Anemia of chronic renal disease, with stable Hgb   Type 2 diabetes mellitus with hyperlipidemia (HCC) Continue glucose cover and monitoring with insulin sliding scale.    PAD (peripheral artery disease) (HCC) Continue blood pressure control, aspirin and statin therapy. .  Class 1 obesity Calculated BMI 30,5         Subjective: Patient with improvement in dyspnea but not back to baseline, continue to have symptoms with movement. No chest pain   Physical Exam: Vitals:   06/30/22 0433 06/30/22 0758 06/30/22 0800 06/30/22 0903  BP:   (!) 121/108 114/84  Pulse:   69 64  Resp:   19 15  Temp:    98.5 F (36.9 C)  TempSrc:  Oral  Oral  SpO2:   97% 97%  Weight: 105 kg     Height:       Neurology awake and alert ENT with mild pallor Cardiovascular with S1 and S2 present and rhythmic with no gallops, rubs or murmurs Respiratory with mild rales with no wheezing or rhonchi Abdomen with no distention  Positive lower extremity edema +   Data Reviewed:  Family Communication: no family at the bedside   Disposition: Status is: Inpatient Remains inpatient appropriate because: IV diuersis   Planned Discharge Destination: Home     Author: Tawni Millers, MD 06/30/2022 9:36 AM  For on call review [email or web address].

## 2022-06-30 NOTE — Evaluation (Signed)
Physical Therapy Evaluation Patient Details Name: David [name] MRN: [id number] DOB: 03-16-1946 Today's Date: 06/30/2022  History of Present Illness  77 yo male adm 2/11 with the past medical history of coronary artery diease, heart failure, pulmonary hypertension, and CKD who presented with dyspnea, and diagnosed with decompensated heart failure.  Clinical Impression  Patient presents with decreased mobility due to generalized weakness, decreased balance, decreased activity tolerance and cardiorespiratory endurance with history of falls.  Currently  mod A for transfers and in room ambulation with min A for getting back into bed.  States he prefers home over post-acute rehab.  Patient likely to need increased level of assistance in the home if able to get [facility] aide and HHPT at d/c.  PT will continue to follow.  If not quickly improved possibly will need SNF.       Recommendations for follow up therapy are one component of a multi-disciplinary discharge planning process, led by the attending physician.  Recommendations may be updated based on patient status, additional functional criteria and insurance authorization.  Follow Up Recommendations Home health PT [facility])      Assistance Recommended at Discharge Frequent or constant Supervision/Assistance  Patient can return home with the following  A lot of help with walking and/or transfers;Help with stairs or ramp for entrance;Assist for transportation;Assistance with cooking/housework;A lot of help with bathing/dressing/bathroom    Equipment Recommendations None recommended by PT  Recommendations for Other Services       Functional Status Assessment Patient has had a recent decline in their functional status and demonstrates the ability to make significant improvements in function in a reasonable and predictable amount of time.     Precautions / Restrictions Precautions Precautions: Fall Precaution Comments: 3 falls in 6 months       Mobility  Bed Mobility Overal bed mobility: Needs Assistance Bed Mobility: Sit to Supine       Sit to supine: Min assist   General bed mobility comments: assist for 1 leg up onto bed, pt not able to lay flat so elevated HOB to him; sleeps in recliner at home    Transfers Overall transfer level: Needs assistance Equipment used: Rolling walker (2 wheels) Transfers: Sit to/from Stand Sit to Stand: Mod assist, Min assist, From elevated surface           General transfer comment: up from recliner needing 2 attempts with heavy mod A; from EOB at higher level with min A x 2 reps for LE strength    Ambulation/Gait Ambulation/Gait assistance: Mod assist Gait Distance (Feet): 12 Feet Assistive device: Rolling walker (2 wheels) Gait Pattern/deviations: Step-to pattern, Wide base of support, Trunk flexed, Decreased dorsiflexion - right, Decreased dorsiflexion - left       General Gait Details: walked around bed to get in on opposite side with mod A for balance, increased time,  SpO2 down to 85% on 2L O2 and HR 60's.  Stairs            Wheelchair Mobility    Modified Rankin (Stroke Patients Only)       Balance Overall balance assessment: Needs assistance   Sitting balance-Leahy Scale: Good     Standing balance support: Bilateral upper extremity supported Standing balance-Leahy Scale: Poor Standing balance comment: heavy UE reliance                             Pertinent Vitals/Pain Pain Assessment Pain Score: 5  Pain  Location: generalized Pain Descriptors / Indicators: Aching, Discomfort, Grimacing Pain Intervention(s): Monitored during session, Limited activity within patient's tolerance    Home Living Family/patient expects to be discharged to:: Private residence Living Arrangements: Spouse/significant other Available Help at Discharge: Family;Available PRN/intermittently Type of Home: House Home Access: Stairs to enter Entrance  Stairs-Rails: Right;Left Entrance Stairs-Number of Steps: 3 Alternate Level Stairs-Number of Steps: full flight, typically backs up the stairs on his bottom Home Layout: Two level Home Equipment: Conservation officer, nature (2 wheels);Cane - single point;Shower seat;Wheelchair - manual      Prior Function Prior Level of Function : Needs assist             Mobility Comments: Stating now using RW in and out of the home.  Spouse generally right beside him when walking. ADLs Comments: Spouse assist with community mobility, ADL, iADL, and medication management.     Hand Dominance   Dominant Hand: Right    Extremity/Trunk Assessment   Upper Extremity Assessment Upper Extremity Assessment: Defer to OT evaluation    Lower Extremity Assessment Lower Extremity Assessment: Generalized weakness;RLE deficits/detail;LLE deficits/detail RLE Deficits / Details: hip and knee AROM WFL, ankle DF limited with symptoms of neuropathy though pt relates sensation intact to light touch; missing 2 toes on R foot and 1 on L foot RLE Sensation: history of peripheral neuropathy RLE Coordination: decreased gross motor LLE Deficits / Details: hip and knee AROM WFL, ankle DF limited with symptoms of neuropathy though pt relates sensation intact to light touch; missing 2 toes on R foot and 1 on L foot LLE Sensation: history of peripheral neuropathy LLE Coordination: decreased gross motor    Cervical / Trunk Assessment Cervical / Trunk Assessment: Kyphotic  Communication   Communication: No difficulties  Cognition Arousal/Alertness: Awake/alert Behavior During Therapy: WFL for tasks assessed/performed Overall Cognitive Status: Within Functional Limits for tasks assessed                                          General Comments General comments (skin integrity, edema, etc.): SpO2 85% after ambulation around bed on 2L O2; back tp 91% after seated rest on O2 for 2 minutes    Exercises Other  Exercises Other Exercises: sit<>stand x 2 reps from elevated height   Assessment/Plan    PT Assessment Patient needs continued PT services  PT Problem List Decreased strength;Decreased balance;Decreased cognition;Decreased knowledge of precautions;Decreased mobility;Decreased activity tolerance;Decreased safety awareness;Decreased knowledge of use of DME       PT Treatment Interventions DME instruction;Functional mobility training;Balance training;Patient/family education;Therapeutic activities;Gait training;Therapeutic exercise    PT Goals (Current goals can be found in the Care Plan section)  Acute Rehab PT Goals Patient Stated Goal: to go home PT Goal Formulation: With patient Time For Goal Achievement: 07/14/22 Potential to Achieve Goals: Fair    Frequency Min 3X/week     Co-evaluation               AM-PAC PT "6 Clicks" Mobility  Outcome Measure Help needed turning from your back to your side while in a flat bed without using bedrails?: A Little Help needed moving from lying on your back to sitting on the side of a flat bed without using bedrails?: A Little Help needed moving to and from a bed to a chair (including a wheelchair)?: A Lot Help needed standing up from a chair using your arms (e.g.,  wheelchair or bedside chair)?: A Lot Help needed to walk in hospital room?: Total Help needed climbing 3-5 steps with a railing? : Total 6 Click Score: 12    End of Session Equipment Utilized During Treatment: Gait belt;Oxygen Activity Tolerance: Patient limited by fatigue Patient left: in bed;with bed alarm set;with call bell/phone within reach   PT Visit Diagnosis: Other abnormalities of gait and mobility (R26.89);History of falling (Z91.81);Other symptoms and signs involving the nervous system (R29.898)    Time: [id number] PT Time Calculation (min) (ACUTE ONLY): 37 min   Charges:   PT Evaluation $PT Eval Moderate Complexity: 1 Mod PT Treatments $Gait Training:  8-22 mins        Magda Kiel, PT Acute Rehabilitation Services Office:[phone] 06/30/2022   Reginia Naas 06/30/2022, 4:57 PM

## 2022-06-30 NOTE — Plan of Care (Signed)

## 2022-06-30 NOTE — Progress Notes (Signed)
Amitted from home for CHF. Orders for heart failure and therapy consults. TOC following.

## 2022-06-30 NOTE — Progress Notes (Signed)
Heart Failure Navigator Progress Note  Assessed for Heart & Vascular TOC clinic readiness.  Patient does not meet criteria due to [facility] cardiology patient.   Navigator will sign off at this time.   Earnestine Leys, BSN, Clinical cytogeneticist Only

## 2022-06-30 NOTE — Evaluation (Signed)
Occupational Therapy Evaluation Patient Details Name: David [name] MRN: [id number] DOB: 1945/06/13 Today's Date: 06/30/2022   History of Present Illness 77 yo male adm 2/11 with the past medical history of coronary artery diease, heart failure, pulmonary hypertension, and CKD who presented with dyspnea, and diagnosed with decompensated heart failure.   Clinical Impression   Patient admitted for the diagnosis above.  Patient lives at home with his spouse, who assists as needed for mobility, ADL and iADL.  Patient stating that his spouse does continue to work part time 3 days/week.  They have considered hiring PCA while she is at work, but have not made that commitment as of yet.  Currently he is needing up to Mod A for lower body ADL and sit to stand transfers.  OT will follow in the acute setting to address deficits listed, and assist with hopeful transition home.  If patient does not progress with transfers and mobility closer to baseline, SNF for short term rehab may be needed.        Recommendations for follow up therapy are one component of a multi-disciplinary discharge planning process, led by the attending physician.  Recommendations may be updated based on patient status, additional functional criteria and insurance authorization.   Follow Up Recommendations  Home health OT     Assistance Recommended at Discharge Intermittent Supervision/Assistance  Patient can return home with the following Assist for transportation;Assistance with cooking/housework;Direct supervision/assist for medications management;A lot of help with walking and/or transfers;A lot of help with bathing/dressing/bathroom    Functional Status Assessment  Patient has had a recent decline in their functional status and demonstrates the ability to make significant improvements in function in a reasonable and predictable amount of time.  Equipment Recommendations  None recommended by OT    Recommendations for  Other Services       Precautions / Restrictions Precautions Precautions: Fall Restrictions Weight Bearing Restrictions: No      Mobility Bed Mobility Overal bed mobility: Needs Assistance Bed Mobility: Supine to Sit     Supine to sit: Supervision, HOB elevated       Patient Response: Cooperative  Transfers Overall transfer level: Needs assistance Equipment used: Rolling walker (2 wheels) Transfers: Sit to/from Stand, Bed to chair/wheelchair/BSC Sit to Stand: Mod assist Stand pivot transfers: Mod assist                Balance Overall balance assessment: Needs assistance Sitting-balance support: Feet supported Sitting balance-Leahy Scale: Good     Standing balance support: Reliant on assistive device for balance Standing balance-Leahy Scale: Poor                             ADL either performed or assessed with clinical judgement   ADL       Grooming: Wash/dry hands;Wash/dry face;Set up;Sitting           Upper Body Dressing : Sitting;Supervision/safety   Lower Body Dressing: Moderate assistance;Sit to/from stand   Toilet Transfer: Moderate assistance;Stand-pivot;Rolling walker (2 wheels);BSC/3in1                   Vision Patient Visual Report: No change from baseline       Perception     Praxis      Pertinent Vitals/Pain Pain Assessment Pain Assessment: Faces Faces Pain Scale: Hurts a little bit Pain Location: B knees with movement Pain Descriptors / Indicators: Aching Pain Intervention(s): Monitored during session  Hand Dominance Right   Extremity/Trunk Assessment Upper Extremity Assessment Upper Extremity Assessment: Generalized weakness   Lower Extremity Assessment Lower Extremity Assessment: Overall WFL for tasks assessed   Cervical / Trunk Assessment Cervical / Trunk Assessment: Kyphotic   Communication Communication Communication: No difficulties   Cognition Arousal/Alertness:  Awake/alert Behavior During Therapy: WFL for tasks assessed/performed Overall Cognitive Status: No family/caregiver present to determine baseline cognitive functioning Area of Impairment: Attention, Memory, Following commands, Problem solving                   Current Attention Level: Selective Memory: Decreased short-term memory Following Commands: Follows one step commands with increased time     Problem Solving: Requires verbal cues, Slow processing       General Comments   Watch BP    Exercises     Shoulder Instructions      Home Living Family/patient expects to be discharged to:: Private residence Living Arrangements: Spouse/significant other Available Help at Discharge: Family;Available PRN/intermittently Type of Home: House Home Access: Stairs to enter [employer] of Steps: 3 Entrance Stairs-Rails: Right;Left Home Layout: Two level Alternate Level Stairs-Number of Steps: full flight, typically backs up the stairs on his bottom Alternate Level Stairs-Rails: Right Bathroom Shower/Tub: Teacher, early years/pre: Standard Bathroom Accessibility: Yes How Accessible: Accessible via walker Home Equipment: [place] (2 wheels);Cane - single point;Shower seat          Prior Functioning/Environment Prior Level of Function : Needs assist       Physical Assist : Mobility (physical);ADLs (physical) Mobility (physical): Transfers   Mobility Comments: Stating now using RW in and out of the home.  Spouse generally right beside him when walking. ADLs Comments: Spouse assist with community mobility, ADL, iADL, and medication management.        OT Problem List: Decreased strength;Decreased activity tolerance;Impaired balance (sitting and/or standing);Pain      OT Treatment/Interventions: Self-care/ADL training;Therapeutic activities;Patient/family education;Balance training;DME and/or AE instruction;Energy conservation    OT  Goals(Current goals can be found in the care plan section) Acute Rehab OT Goals Patient Stated Goal: Return home OT Goal Formulation: With patient Time For Goal Achievement: 07/14/22 Potential to Achieve Goals: Fair ADL Goals Pt Will Perform Grooming: with min guard assist;standing Pt Will Perform Lower Body Dressing: with min assist;sit to/from stand Pt Will Transfer to Toilet: with min guard assist;ambulating;regular height toilet  OT Frequency: Min 2X/week    Co-evaluation              AM-PAC OT "6 Clicks" Daily Activity     Outcome Measure Help from another person eating meals?: None Help from another person taking care of personal grooming?: None Help from another person toileting, which includes using toliet, bedpan, or urinal?: A Lot Help from another person bathing (including washing, rinsing, drying)?: A Lot Help from another person to put on and taking off regular upper body clothing?: A Little Help from another person to put on and taking off regular lower body clothing?: A Lot 6 Click Score: 17   End of Session Equipment Utilized During Treatment: Gait belt;Rolling walker (2 wheels);Oxygen Nurse Communication: Mobility status  Activity Tolerance: Patient tolerated treatment well Patient left: in chair;with call bell/phone within reach;with chair alarm set  OT Visit Diagnosis: Unsteadiness on feet (R26.81);Muscle weakness (generalized) (M62.81);Pain Pain - Right/Left: Right Pain - part of body: Knee                Time: [id number] OT Time Calculation (  min): 21 min Charges:  OT General Charges $OT Visit: 1 Visit OT Evaluation $OT Eval Moderate Complexity: 1 Mod  06/30/2022  RP, OTR/L  Acute Rehabilitation Services  Office:  [phone]   Metta Clines 06/30/2022, 11:36 AM

## 2022-06-30 NOTE — Progress Notes (Signed)
  Echocardiogram 2D Echocardiogram has been performed.  David [name] 06/30/2022, 8:54 AM

## 2022-06-30 NOTE — Hospital Course (Addendum)
David [name] was admitted to the hospital with the working diagnosis of decompensated heart failure.   77 yo male with the past medical history of coronary artery diease, heart failure, pulmonary hypertension, and CKD who presented with dyspnea. He discontinue torsemide 4 weeks ago due to polyuria. Reported drinking about a gallon of orange juice the the prior.  He reported acute onset of dyspnea, he called EMS and he was found hypoxemic with 02 saturation 85%. He was placed on supplemental 02 per [place] and transported to the ED. On his initial physical examination his blood pressure was 153/119, 174/87, HR 81 and 02 saturation 98%, lungs with bilateral rales, decreased breath sounds bilaterally, heart with S1 and S2 present and rhythmic with no gallops, rubs or murmurs, abdomen not distended and no lower extremity edema.   Na 134, K 3,9 CL 101, bicarbonate 21, glucose 462, bun 21 cr 1,87  BNP 243,4 High sensitive troponin 18 and 90  Wbc 11.8 hgb 10,8 plt 257  Sars covid 19 negative   Chest radiograph with mild cardiomegaly, with bilateral hilar vascular congestion and bilateral interstitial symmetrical infiltrates.   EKG 89 bpm, normal axis, normal intervals, sinus rhythm, with no significant ST changes, new negative T wave lead II, III, AvF, V4 to V6.   Patient was placed on furosemide for diuresis.   02/13 this morning patient declined blood work, his symptoms are improving.

## 2022-06-30 NOTE — Inpatient Diabetes Management (Signed)
Inpatient Diabetes Program Recommendations  AACE/ADA: New Consensus Statement on Inpatient Glycemic Control (2015)  Target Ranges:  Prepandial:   less than 140 mg/dL      Peak postprandial:   less than 180 mg/dL (1-2 hours)      Critically ill patients:  140 - 180 mg/dL   Lab Results  Component Value Date   GLUCAP 164 (H) 06/30/2022   HGBA1C 8.6 (H) 06/29/2022    Review of Glycemic Control  Latest Reference Range & Units 06/29/22 15:55 06/29/22 21:04 06/30/22 06:24 06/30/22 11:24  Glucose-Capillary 70 - 99 mg/dL 345 (H) 104 (H) 128 (H) 164 (H)  (H): Data is abnormally high Diabetes history: Type 2 DM Outpatient Diabetes medications: Humalog 75/25 36 units BID Current orders for Inpatient glycemic control: Novolog 0-20 units TID  Inpatient Diabetes Program Recommendations:    If patient to remain inpatient consider: -changing correction to Novolog 0-9 units TID given recent creatinine -Adding Semglee 10 units Qd  Thanks, Bronson Curb, MSN, RNC-OB Diabetes Coordinator [phone] (8a-5p)

## 2022-06-30 NOTE — Assessment & Plan Note (Signed)
Calculated BMI 30,5

## 2022-07-01 DIAGNOSIS — N1832 Chronic kidney disease, stage 3b: Secondary | ICD-10-CM | POA: Diagnosis not present

## 2022-07-01 DIAGNOSIS — I5033 Acute on chronic diastolic (congestive) heart failure: Secondary | ICD-10-CM | POA: Diagnosis not present

## 2022-07-01 DIAGNOSIS — E1169 Type 2 diabetes mellitus with other specified complication: Secondary | ICD-10-CM | POA: Diagnosis not present

## 2022-07-01 DIAGNOSIS — I10 Essential (primary) hypertension: Secondary | ICD-10-CM | POA: Diagnosis not present

## 2022-07-01 LAB — GLUCOSE, CAPILLARY
Glucose-Capillary: 131 mg/dL — ABNORMAL HIGH (ref 70–99)
Glucose-Capillary: 205 mg/dL — ABNORMAL HIGH (ref 70–99)
Glucose-Capillary: 320 mg/dL — ABNORMAL HIGH (ref 70–99)
Glucose-Capillary: 192 mg/dL — ABNORMAL HIGH (ref 70–99)

## 2022-07-01 LAB — BASIC METABOLIC PANEL
Anion gap: 9 (ref 5–15)
BUN: 31 mg/dL — ABNORMAL HIGH (ref 8–23)
CO2: 26 mmol/L (ref 22–32)
Calcium: 8.2 mg/dL — ABNORMAL LOW (ref 8.9–10.3)
Chloride: 102 mmol/L (ref 98–111)
Creatinine, Ser: 2.53 mg/dL — ABNORMAL HIGH (ref 0.61–1.24)
GFR, Estimated: 26 mL/min — ABNORMAL LOW (ref >60)
Glucose, Bld: 358 mg/dL — ABNORMAL HIGH (ref 70–99)
Potassium: 3.9 mmol/L (ref 3.5–5.1)
Sodium: 137 mmol/L (ref 135–145)

## 2022-07-01 NOTE — Progress Notes (Signed)
Mobility Specialist - Progress Note   07/01/22 1500  Mobility  Activity Refused mobility    Pt stated he was fatigued and had just returned to the bed from recliner. After encouragement, he said "maybe tomorrow." Will follow up as able.   [place] Specialist Please contact via SecureChat or Rehab office at [phone]

## 2022-07-01 NOTE — Progress Notes (Signed)
PROGRESS NOTE    David [name]  [id number] DOB: 02/12/1946 DOA: 06/29/2022 PCP: [name], MD   [id number] w/ CAD, diastolic CHF, pulmonary hypertension, and CKD who presented with dyspnea. He discontinued torsemide 4 weeks ago due to polyuria. Reported drinking about a gallon of orange juice the week prior.  He reported acute onset of dyspnea, O2 sats 85% In ED,  cr 1,87, BNP 243, troponin 18 and 90, CXR w/ with bilateral hilar vascular congestion and bilateral interstitial symmetrical infiltrates.  -02/13 this morning patient declined blood work, his symptoms are improving.  -2/14 adamant to leave tomorrow regardless of what we say, considering AMA    Subjective: Breathing improving   Assessment and Plan:  Acute on chronic diastolic CHF  Pulm HTN Echo with preserved LV systolic function with EF 50 to 55%, mild LVH, RV systolic function preserved, -diuresing on IV lasix, he is 5.2 L negative -Continue diuresis with furosemide IV 60 mg q12.,  Labs pending would not allow early morning lab draws -continue Bidil, metoprolol.  -GDMT limited by CKD -BMP in a.m.  Essential hypertension -amlodipine, metoprolol, and Bidil Diuresis with IV furosemide   Stage 3b chronic kidney disease ([place]) Patient had declined blood work yesterday -baseline creat around 2, now 2.3 -Monitor  Anemia of chronic renal disease,  -stable Hgb   Type 2 diabetes mellitus with hyperlipidemia (HCC) Continue glucose cover and monitoring with insulin sliding scale.   PAD (peripheral artery disease) (HCC) Continue blood pressure control, aspirin and statin therapy. .  Class 1 obesity Calculated BMI 30,5     DVT prophylaxis: lovenox Code Status: Full code Family Communication: None present Disposition Plan: Home likely 48 hours  Consultants:    Procedures:   Antimicrobials:    Objective: Vitals:   07/01/22 1117 07/01/22 1526 07/01/22 1812 07/01/22 1904  BP: (!) 156/71 131/64 126/67  135/64  Pulse: 62 60 63 (!) 59  Resp: 13 12 13 17  $ Temp: 98.2 F (36.8 C) 97.6 F (36.4 C)    TempSrc: Oral Oral  Oral  SpO2: 99% 100% 95% 95%  Weight:      Height:        Intake/Output Summary (Last 24 hours) at 07/01/2022 2027 Last data filed at 07/01/2022 1905 Gross per 24 hour  Intake 720 ml  Output 2700 ml  Net -1980 ml   Filed Weights   06/29/22 0444 06/30/22 0433  Weight: 104.3 kg 105 kg    Examination:  General exam: Appears calm and comfortable  Respiratory system: Clear to auscultation Cardiovascular system: S1 & S2 heard, RRR.  Abd: nondistended, soft and nontender.Normal bowel sounds heard. Central nervous system: Alert and oriented. No focal neurological deficits. Extremities: no edema Skin: No rashes Psychiatry:  Mood & affect appropriate.     Data Reviewed:   CBC: Recent Labs  Lab 06/29/22 0454  WBC 11.8*  NEUTROABS 10.4*  HGB 10.8*  HCT 32.8*  MCV 105.8*  PLT [id number]   Basic Metabolic Panel: Recent Labs  Lab 06/29/22 0454 06/30/22 0025 07/01/22 1544  NA 134* 136 137  K 3.9 3.6 3.9  CL 101 103 102  CO2 21* 26 26  GLUCOSE 462* 124* 358*  BUN 21 21 31*  CREATININE 1.87* 2.09* 2.53*  CALCIUM 8.8* 8.7* 8.2*   GFR: Estimated Creatinine Clearance: 32.1 mL/min (A) (by C-G formula based on SCr of 2.53 mg/dL (H)). Liver Function Tests: No results for input(s): "AST", "ALT", "ALKPHOS", "BILITOT", "PROT", "ALBUMIN" in the last 168 hours. No  results for input(s): "LIPASE", "AMYLASE" in the last 168 hours. No results for input(s): "AMMONIA" in the last 168 hours. Coagulation Profile: No results for input(s): "INR", "PROTIME" in the last 168 hours. Cardiac Enzymes: No results for input(s): "CKTOTAL", "CKMB", "CKMBINDEX", "TROPONINI" in the last 168 hours. BNP (last 3 results) No results for input(s): "PROBNP" in the last 8760 hours. HbA1C: Recent Labs    06/29/22 1130  HGBA1C 8.6*   CBG: Recent Labs  Lab 06/30/22 1602 06/30/22 2113  07/01/22 0616 07/01/22 1145 07/01/22 1643  GLUCAP 168* 144* 131* 205* 320*   Lipid Profile: No results for input(s): "CHOL", "HDL", "LDLCALC", "TRIG", "CHOLHDL", "LDLDIRECT" in the last 72 hours. Thyroid Function Tests: No results for input(s): "TSH", "T4TOTAL", "FREET4", "T3FREE", "THYROIDAB" in the last 72 hours. Anemia Panel: No results for input(s): "VITAMINB12", "FOLATE", "FERRITIN", "TIBC", "IRON", "RETICCTPCT" in the last 72 hours. Urine analysis:    Component Value Date/Time   COLORURINE YELLOW 08/15/2018 0911   APPEARANCEUR HAZY (A) 08/15/2018 0911   LABSPEC 1.021 08/15/2018 0911   PHURINE 5.0 08/15/2018 0911   GLUCOSEU 150 (A) 08/15/2018 0911   HGBUR SMALL (A) 08/15/2018 0911   BILIRUBINUR NEGATIVE 08/15/2018 0911   KETONESUR 5 (A) 08/15/2018 0911   PROTEINUR 100 (A) 08/15/2018 0911   UROBILINOGEN 1.0 02/13/2015 1934   NITRITE NEGATIVE 08/15/2018 0911   LEUKOCYTESUR NEGATIVE 08/15/2018 0911   Sepsis Labs: @LABRCNTIP$ (procalcitonin:4,lacticidven:4)  ) Recent Results (from the past 240 hour(s))  Resp panel by RT-PCR (RSV, Flu A&B, Covid) Anterior Nasal Swab     Status: None   Collection Time: 06/29/22  4:55 AM   Specimen: Anterior Nasal Swab  Result Value Ref Range Status   SARS Coronavirus 2 by RT PCR NEGATIVE NEGATIVE Final   Influenza A by PCR NEGATIVE NEGATIVE Final   Influenza B by PCR NEGATIVE NEGATIVE Final    Comment: (NOTE) The Xpert Xpress SARS-CoV-2/FLU/RSV plus assay is intended as an aid in the diagnosis of influenza from Nasopharyngeal swab specimens and should not be used as a sole basis for treatment. Nasal washings and aspirates are unacceptable for Xpert Xpress SARS-CoV-2/FLU/RSV testing.  Fact Sheet for Patients: EntrepreneurPulse.com.au  Fact Sheet for Healthcare Providers: IncredibleEmployment.be  This test is not yet approved or cleared by the [place] FDA and has been authorized for detection  and/or diagnosis of SARS-CoV-2 by FDA under an Emergency Use Authorization (EUA). This EUA will remain in effect (meaning this test can be used) for the duration of the COVID-19 declaration under Section 564(b)(1) of the Act, 21 U.S.C. section 360bbb-3(b)(1), unless the authorization is terminated or revoked.     Resp Syncytial Virus by PCR NEGATIVE NEGATIVE Final    Comment: (NOTE) Fact Sheet for Patients: EntrepreneurPulse.com.au  Fact Sheet for Healthcare Providers: IncredibleEmployment.be  This test is not yet approved or cleared by the [place] FDA and has been authorized for detection and/or diagnosis of SARS-CoV-2 by FDA under an Emergency Use Authorization (EUA). This EUA will remain in effect (meaning this test can be used) for the duration of the COVID-19 declaration under Section 564(b)(1) of the Act, 21 U.S.C. section 360bbb-3(b)(1), unless the authorization is terminated or revoked.  Performed at [facility], [place] [address], [place], [place] [address]      Radiology Studies: ECHOCARDIOGRAM COMPLETE  Result Date: 06/30/2022    ECHOCARDIOGRAM REPORT   Patient Name:   GABIN SCHWIETERMAN Date of Exam: 06/30/2022 Medical Rec #:  [id number]  Height:       73.0 in Accession #:    [id number]       Weight:       231.5 lb Date of Birth:  Jul 24, 1945       BSA:          2.290 m Patient Age:    77 years         BP:           121/108 mmHg Patient Gender: M                HR:           68 bpm. Exam Location:  Inpatient Procedure: 2D Echo and Intracardiac Opacification Agent Indications:    CHF  History:        Patient has prior history of Echocardiogram examinations, most                 recent 12/22/2017. PAD; Risk Factors:Diabetes and Hypertension.  Sonographer:    Harvie Junior Referring Phys: [phone] MICHAEL E NORINS  Sonographer Comments: Technically difficult study due to poor echo windows. IMPRESSIONS  1. Left ventricular ejection  fraction, by estimation, is 50 to 55%. The left ventricle has low normal function. The left ventricle has no regional wall motion abnormalities. There is mild concentric left ventricular hypertrophy. Left ventricular diastolic parameters are indeterminate.  2. Right ventricular systolic function is normal. The right ventricular size is mildly enlarged. There is severely elevated pulmonary artery systolic pressure. The estimated right ventricular systolic pressure is [id number] mmHg.  3. Left atrial size was mildly dilated.  4. The mitral valve is normal in structure. Trivial mitral valve regurgitation. No evidence of mitral stenosis.  5. The aortic valve is tricuspid. There is mild calcification of the aortic valve. There is mild thickening of the aortic valve. Aortic valve regurgitation is not visualized. Aortic valve sclerosis/calcification is present, without any evidence of aortic stenosis.  6. The inferior vena cava is dilated in size with <50% respiratory variability, suggesting right atrial pressure of 15 mmHg. Comparison(s): No significant change from prior study. Conclusion(s)/Recommendation(s): Severe pulmonary hypertension. FINDINGS  Left Ventricle: Left ventricular ejection fraction, by estimation, is 50 to 55%. The left ventricle has low normal function. The left ventricle has no regional wall motion abnormalities. Definity contrast agent was given IV to delineate the left ventricular endocardial borders. The left ventricular internal cavity size was normal in size. There is mild concentric left ventricular hypertrophy. Left ventricular diastolic parameters are indeterminate. Right Ventricle: The right ventricular size is mildly enlarged. Right vetricular wall thickness was not well visualized. Right ventricular systolic function is normal. There is severely elevated pulmonary artery systolic pressure. The tricuspid regurgitant velocity is 3.71 m/s, and with an assumed right atrial pressure of 15 mmHg, the  estimated right ventricular systolic pressure is [id number] mmHg. Left Atrium: Left atrial size was mildly dilated. Right Atrium: Right atrial size was normal in size. Pericardium: Trivial pericardial effusion is present. Mitral Valve: The mitral valve is normal in structure. Trivial mitral valve regurgitation. No evidence of mitral valve stenosis. Tricuspid Valve: The tricuspid valve is normal in structure. Tricuspid valve regurgitation is mild . No evidence of tricuspid stenosis. Aortic Valve: The aortic valve is tricuspid. There is mild calcification of the aortic valve. There is mild thickening of the aortic valve. Aortic valve regurgitation is not visualized. Aortic valve sclerosis/calcification is present, without any evidence of aortic stenosis. Aortic valve mean gradient measures 4.0 mmHg. Aortic valve  peak gradient measures 7.0 mmHg. Aortic valve area, by VTI measures 2.18 cm. Pulmonic Valve: The pulmonic valve was grossly normal. Pulmonic valve regurgitation is trivial. No evidence of pulmonic stenosis. Aorta: The aortic root, ascending aorta, aortic arch and descending aorta are all structurally normal, with no evidence of dilitation or obstruction. Venous: The inferior vena cava is dilated in size with less than 50% respiratory variability, suggesting right atrial pressure of 15 mmHg. IAS/Shunts: The atrial septum is grossly normal. Additional Comments: There is a small pleural effusion in both left and right lateral regions.  LEFT VENTRICLE PLAX 2D LVIDd:         5.10 cm      Diastology LVIDs:         3.70 cm      LV e' medial:    6.22 cm/s LV PW:         1.20 cm      LV E/e' medial:  10.9 LV IVS:        1.10 cm      LV e' lateral:   8.39 cm/s LVOT diam:     2.10 cm      LV E/e' lateral: 8.1 LV SV:         61 LV SV Index:   26 LVOT Area:     3.46 cm  LV Volumes (MOD) LV vol d, MOD A2C: 105.0 ml LV vol d, MOD A4C: 151.0 ml LV vol s, MOD A2C: 55.0 ml LV vol s, MOD A4C: 68.7 ml LV SV MOD A2C:     50.0 ml LV SV  MOD A4C:     151.0 ml LV SV MOD BP:      67.4 ml RIGHT VENTRICLE RV Basal diam:  4.20 cm RV Mid diam:    3.50 cm RV S prime:     10.40 cm/s TAPSE (M-mode): 2.5 cm LEFT ATRIUM           Index        RIGHT ATRIUM           Index LA diam:      4.10 cm 1.79 cm/m   RA Area:     18.50 cm LA Vol (A2C): 60.7 ml 26.51 ml/m  RA Volume:   55.60 ml  24.28 ml/m LA Vol (A4C): 73.1 ml 31.93 ml/m  AORTIC VALVE AV Area (Vmax):    2.23 cm AV Area (Vmean):   2.07 cm AV Area (VTI):     2.18 cm AV Vmax:           132.67 cm/s AV Vmean:          88.900 cm/s AV VTI:            0.278 m AV Peak Grad:      7.0 mmHg AV Mean Grad:      4.0 mmHg LVOT Vmax:         85.30 cm/s LVOT Vmean:        53.100 cm/s LVOT VTI:          0.175 m LVOT/AV VTI ratio: 0.63  AORTA Ao Root diam: 3.50 cm MITRAL VALVE               TRICUSPID VALVE MV Area (PHT): 4.06 cm    TR Peak grad:   55.1 mmHg MV Decel Time: 187 msec    TR Vmax:        371.00 cm/s MR Peak grad: 25.0 mmHg MR Vmax:  250.00 cm/s  SHUNTS MV E velocity: 67.90 cm/s  Systemic VTI:  0.18 m MV A velocity: 61.00 cm/s  Systemic Diam: 2.10 cm MV E/A ratio:  1.11 Buford Dresser MD Electronically signed by Buford Dresser MD Signature Date/Time: 06/30/2022/10:26:02 AM    Final      Scheduled Meds:  amLODipine  10 mg Oral Daily   aspirin EC  81 mg Oral Daily   atorvastatin  40 mg Oral Daily   dicyclomine  10 mg Oral TID AC & HS   docusate sodium  100 mg Oral Daily   dorzolamide-timolol  1 drop Both Eyes BID   enoxaparin (LOVENOX) injection  40 mg Subcutaneous Q24H   ferrous sulfate  325 mg Oral Q breakfast   furosemide  60 mg Intravenous Q12H   insulin aspart  0-20 Units Subcutaneous TID WC   isosorbide-hydrALAZINE  2 tablet Oral TID   metoprolol succinate  100 mg Oral Daily   sodium chloride flush  3 mL Intravenous Q12H   traZODone  100 mg Oral QHS   Continuous Infusions:  sodium chloride       LOS: 2 days    Time spent: 39mn    PDomenic Polite MD [facility]   07/01/2022, 8:27 PM

## 2022-07-01 NOTE — Progress Notes (Signed)
SATURATION QUALIFICATIONS: (This note is used to comply with regulatory documentation for home oxygen)  Patient Saturations on Room Air at Rest = 88%  Patient Saturations on Room Air while Ambulating = 80%  Patient Saturations on 2 Liters of oxygen while Ambulating = 90%  Please briefly explain why patient needs home oxygen:Pt requires supplemental 02 to maintain oxygen saturations >90%.   Marisa Severin, PT, DPT Acute Rehabilitation Services Secure chat preferred Office [phone]

## 2022-07-01 NOTE — Progress Notes (Signed)
Patient refused AM lab

## 2022-07-01 NOTE — Progress Notes (Signed)
Physical Therapy Treatment Patient Details Name: CHIRAG DAIGNEAULT MRN: [id number] DOB: Aug 08, 1945 Today's Date: 07/01/2022   History of Present Illness 77 yo male adm 2/11 with the past medical history of coronary artery diease, heart failure, pulmonary hypertension, and CKD who presented with dyspnea, and diagnosed with decompensated heart failure.    PT Comments    Patient not progressing well towards PT goals. Pt stating he wants to go home. Continues to require Min A for standing from elevated bed height, maintaining flexed posture. Noted to have weakness in BLEs. Able to take a few steps to get to chair with Min A for balance and RW management however declined further distance stating, "I don't normally walk much at home." Sp02 89% on RA at rest, dropped to 80% with activity, donned 2L and able to recover within 1-2 mins with cues for pursed lip breathing. Reports he does not use 02 at home but needing it at this time. Will need hands on assist for all mobility at home to decrease falls. Will follow.    Recommendations for follow up therapy are one component of a multi-disciplinary discharge planning process, led by the attending physician.  Recommendations may be updated based on patient status, additional functional criteria and insurance authorization.  Follow Up Recommendations  Other (comment) ([place] aide)     Assistance Recommended at Discharge Frequent or constant Supervision/Assistance  Patient can return home with the following A lot of help with walking and/or transfers;Help with stairs or ramp for entrance;Assist for transportation;Assistance with cooking/housework;A lot of help with bathing/dressing/bathroom   Equipment Recommendations  None recommended by PT    Recommendations for Other Services       Precautions / Restrictions Precautions Precautions: Fall;Other (comment) Precaution Comments: 3 falls in 6 months Restrictions Weight Bearing Restrictions: No      Mobility  Bed Mobility Overal bed mobility: Needs Assistance Bed Mobility: Supine to Sit     Supine to sit: Supervision, HOB elevated     General bed mobility comments: Increased time to get to EOB with use of rails, normally sleeps in recliner at home.    Transfers Overall transfer level: Needs assistance Equipment used: Rolling walker (2 wheels) Transfers: Sit to/from Stand, Bed to chair/wheelchair/BSC Sit to Stand: Min assist, From elevated surface   Step pivot transfers: Min assist       General transfer comment: Min A to power to standing with cues for hand placement/technique, difficulty getting into full hip/knee extension with flexed posture. Able to take a few steps to get to chair with Min A for balance and RW management, bil knees flexed throughout. Sp02 dropped to 80% on RA, donned 2L and able to recover with cues for pursed lip breathing.    Ambulation/Gait               General Gait Details: "I don't walk much at home" so declined walking today.   Stairs             Wheelchair Mobility    Modified Rankin (Stroke Patients Only)       Balance Overall balance assessment: Needs assistance Sitting-balance support: Feet supported, No upper extremity supported Sitting balance-Leahy Scale: Good     Standing balance support: During functional activity, Bilateral upper extremity supported Standing balance-Leahy Scale: Poor Standing balance comment: heavy UE reliance and Min A for balance.  Cognition Arousal/Alertness: Awake/alert Behavior During Therapy: WFL for tasks assessed/performed Overall Cognitive Status: Impaired/Different from baseline Area of Impairment: Safety/judgement, Following commands, Attention, Problem solving                   Current Attention Level: Selective   Following Commands: Follows one step commands with increased time Safety/Judgement: Decreased awareness of safety,  Decreased awareness of deficits   Problem Solving: Requires verbal cues, Slow processing General Comments: poor understanding of heart failure and reason to take diuretic as well as need for supplemental 02.        Exercises      General Comments General comments (skin integrity, edema, etc.): Sp02 89% on RA at rest, dropped to 80% with activity, donned 2L and able to recover within 1-2 mins with cues for pursed lip breathing.      Pertinent Vitals/Pain Pain Assessment Pain Assessment: No/denies pain    Home Living                          Prior Function            PT Goals (current goals can now be found in the care plan section) Progress towards PT goals: Not progressing toward goals - comment (self limiting)    Frequency    Min 3X/week      PT Plan Current plan remains appropriate    Co-evaluation              AM-PAC PT "6 Clicks" Mobility   Outcome Measure  Help needed turning from your back to your side while in a flat bed without using bedrails?: A Little Help needed moving from lying on your back to sitting on the side of a flat bed without using bedrails?: A Little Help needed moving to and from a bed to a chair (including a wheelchair)?: A Lot Help needed standing up from a chair using your arms (e.g., wheelchair or bedside chair)?: A Lot Help needed to walk in hospital room?: Total Help needed climbing 3-5 steps with a railing? : Total 6 Click Score: 12    End of Session Equipment Utilized During Treatment: Gait belt;Oxygen Activity Tolerance: Patient limited by fatigue;Treatment limited secondary to medical complications (Comment) (drop in sp02) Patient left: in chair;with call bell/phone within reach;with nursing/sitter in room (with tech present in room getting blood sugar) Nurse Communication: Mobility status;Other (comment) (02 dropping) PT Visit Diagnosis: Other abnormalities of gait and mobility (R26.89);History of falling  (Z91.81);Other symptoms and signs involving the nervous system (R29.898)     Time: [id number] PT Time Calculation (min) (ACUTE ONLY): 22 min  Charges:  $Therapeutic Activity: 8-22 mins                     Marisa Severin, PT, DPT Acute Rehabilitation Services Secure chat preferred Office [phone]      Marguarite Arbour A [name] Heck 07/01/2022, 12:27 PM

## 2022-07-01 NOTE — Progress Notes (Signed)
Progress Note   Patient: David [name] [id number] DOB: Mar 01, 1946 DOA: 06/29/2022     2 DOS: the patient was seen and examined on 07/01/2022   Brief hospital course: David [name] was admitted to the hospital with the working diagnosis of decompensated heart failure.   77 yo male with the past medical history of coronary artery diease, heart failure, pulmonary hypertension, and CKD who presented with dyspnea. He discontinue torsemide 4 weeks ago due to polyuria. Reported drinking about a gallon of orange juice the the prior.  He reported acute onset of dyspnea, he called EMS and he was found hypoxemic with 02 saturation 85%. He was placed on supplemental 02 per [place] and transported to the ED. On his initial physical examination his blood pressure was 153/119, 174/87, HR 81 and 02 saturation 98%, lungs with bilateral rales, decreased breath sounds bilaterally, heart with S1 and S2 present and rhythmic with no gallops, rubs or murmurs, abdomen not distended and no lower extremity edema.   Na 134, K 3,9 CL 101, bicarbonate 21, glucose 462, bun 21 cr 1,87  BNP 243,4 High sensitive troponin 18 and 90  Wbc 11.8 hgb 10,8 plt 257  Sars covid 19 negative   Chest radiograph with mild cardiomegaly, with bilateral hilar vascular congestion and bilateral interstitial symmetrical infiltrates.   EKG 89 bpm, normal axis, normal intervals, sinus rhythm, with no significant ST changes, new negative T wave lead II, III, AvF, V4 to V6.   Patient was placed on furosemide for diuresis.   02/13 this morning patient declined blood work, his symptoms are improving.   Assessment and Plan: * Acute on chronic diastolic CHF (congestive heart failure) (HCC) Echocardiogram with preserved LV systolic function with EF 50 to 55%, mild LVH, RV systolic function preserved, mild enlarged RV. RVSP 70.1., LA with mild dilatation, no significant valvular disease.   Pulmonary hypertension Acute on chronic core pulmonale.    Urine output is [id number] ml Systolic blood pressure 123456 to 130 mmHg.   Continue diuresis with furosemide IV 60 mg q12.  After load reduction with Bidil and continue metoprolol.  Blood pressure control with amlodipine.  Hold on RAS inhibition until renal function more stable.   Essential hypertension Continue blood pressure control with amlodipine, metoprolol, and Bidil Diuresis with IV furosemide   Stage 3b chronic kidney disease ([place]) Patient had declined blood work this morning, but now has agreed. Will continue close monitoring renal function and correct electrolytes. Keep K at 4 and Mg at 2.   Anemia of chronic renal disease, with stable Hgb   Type 2 diabetes mellitus with hyperlipidemia (HCC) Continue glucose cover and monitoring with insulin sliding scale.    PAD (peripheral artery disease) (HCC) Continue blood pressure control, aspirin and statin therapy. .  Class 1 obesity Calculated BMI 30,5         Subjective: Patient is feeling better but not yet back to his baseline, no chest pain, mild edema.,   Physical Exam: Vitals:   07/01/22 0225 07/01/22 0755 07/01/22 1117 07/01/22 1526  BP: (!) 153/76 (!) 122/54 (!) 156/71 131/64  Pulse: 66 66 62 60  Resp: 14 (!) 24 13 12  $ Temp:  98 F (36.7 C) 98.2 F (36.8 C) 97.6 F (36.4 C)  TempSrc:  Oral Oral Oral  SpO2: 91% 98% 99% 100%  Weight:      Height:       Neurology awake and alert ENT with mild pallor Cardiovascular with S1 and S2 present and  rhythmic with no gallops, rubs or murmurs Moderate JVD Lower extremity edema + Respiratory with rales bilaterally with no wheezing or rhonchi Abdomen with mild distention with no tenderness  Data Reviewed:    Family Communication: I spoke with patient's wife at the bedside, we talked in detail about patient's condition, plan of care and prognosis and all questions were addressed.   Disposition: Status is: Inpatient Remains inpatient appropriate because: IV  diuresis for heart failure   Planned Discharge Destination: Home  Author: Tawni Millers, MD 07/01/2022 4:05 PM  For on call review [email or web address].

## 2022-07-01 NOTE — TOC Initial Note (Signed)
Transition of Care [facility]) - Initial/Assessment Note    Patient Details  Name: David [name] MRN: [id number] Date of Birth: 07/03/1945  [facility]) CM/SW Contact:    Cyndi Bender, RN Phone Number: 07/01/2022, 3:29 PM  Clinical Narrative:                 Spoke to patient at bedside regarding transition needs.  Patient lives with his wife. Patient declines need for home health.  Patient currently qualifies for home 02. Bedside RN to recheck closer to discharge.    Address, Phone number and PCP verified.  TOC continue to follow for needs.    Expected Discharge Plan: [place] Barriers to Discharge: Continued Medical Work up   Patient Goals and CMS Choice Patient states their goals for this hospitalization and ongoing recovery are:: return home          Expected Discharge Plan and Services   Discharge Planning Services: CM Consult   Living arrangements for the past 2 months: Single Family Home                                      Prior Living Arrangements/Services Living arrangements for the past 2 months: Single Family Home Lives with:: Spouse Patient language and need for interpreter reviewed:: Yes Do you feel safe going back to the place where you live?: Yes      Need for Family Participation in Patient Care: Yes (Comment) Care giver support system in place?: Yes (comment) Current home services: DME Criminal Activity/Legal Involvement Pertinent to Current Situation/Hospitalization: No - Comment as needed  Activities of Daily Living Home Assistive Devices/Equipment: Wheelchair, Environmental consultant (specify type), Shower chair with back ADL Screening (condition at time of admission) Patient's cognitive ability adequate to safely complete daily activities?: Yes Is the patient deaf or have difficulty hearing?: No Does the patient have difficulty seeing, even when wearing glasses/contacts?: No Does the patient have difficulty  concentrating, remembering, or making decisions?: No Patient able to express need for assistance with ADLs?: Yes Does the patient have difficulty dressing or bathing?: No Independently performs ADLs?: Yes (appropriate for developmental age) Does the patient have difficulty walking or climbing stairs?: Yes Weakness of Legs: Both Weakness of Arms/Hands: None  Permission Sought/Granted                  Emotional Assessment Appearance:: Appears stated age Attitude/Demeanor/Rapport: Gracious Affect (typically observed): Accepting Orientation: : Oriented to Self, Oriented to Place, Oriented to  Time, Oriented to Situation Alcohol / Substance Use: Not Applicable Psych Involvement: No (comment)  Admission diagnosis:  Acute pulmonary edema (HCC) [J81.0] Acute on chronic diastolic (congestive) heart failure (HCC) [I50.33] Patient Active Problem List   Diagnosis Date Noted   Class 1 obesity 06/30/2022   Acute on chronic diastolic CHF (congestive heart failure) ([place]) 06/29/2022   PAD (peripheral artery disease) ([place]) 06/13/2022   Critical limb ischemia of left lower extremity ([place]) 03/21/2021   Nonhealing ulcer of left lower extremity limited to breakdown of skin ([place]) 03/21/2021   Abscess of left foot 08/03/2020   Osteomyelitis of fifth toe of left foot ([place])    Mixed hyperlipidemia 10/15/2018   Bilateral leg edema 09/17/2018   Stage 3b chronic kidney disease ([place]) 09/10/2018   Anemia 09/10/2018   Pain and swelling of wrist, right 08/14/2018   Bilateral carotid artery stenosis 07/30/2018  Coronary artery disease involving native coronary artery of native heart without angina pectoris 07/30/2018   Snoring 02/11/2018   Chronic heart failure with preserved ejection fraction (HFpEF) ([place]) 12/22/2017   At risk for adverse drug reaction 12/15/2017   Chronic pain syndrome 12/15/2017   Status post coronary artery stent placement    Multifocal pneumonia 03/30/2017   Type 2 diabetes  mellitus with hyperlipidemia ([place]) 03/29/2017   Acute on chronic respiratory failure with hypoxia (HCC)    Pulmonary congestion    Acquired contracture of Achilles tendon, right 08/19/2016   Amputated great toe, right ([place]) 07/18/2016   Onychomycosis 05/29/2016   Diabetic polyneuropathy associated with type 2 diabetes mellitus ([place]) 04/09/2016   Right foot ulcer, limited to breakdown of skin ([place]) 04/09/2016   Unspecified hereditary and idiopathic peripheral neuropathy 09/15/2013   Malaise 08/14/2013   Essential hypertension 08/14/2013   Chronic back pain 08/14/2013   Glaucoma 08/14/2013   Headache 08/14/2013   PCP:  [name], MD Pharmacy:   [facility] [address] [place] [place] [address] Phone: [phone] Fax: [phone]     Social Determinants of Health (SDOH) Social History: SDOH Screenings   Food Insecurity: No Food Insecurity (06/29/2022)  Housing: Low Risk  (06/29/2022)  Transportation Needs: No Transportation Needs (06/29/2022)  Utilities: Not At Risk (06/29/2022)  Financial Resource Strain: Low Risk  (08/20/2017)  Physical Activity: Inactive (08/20/2017)  Social Connections: Socially Integrated (12/19/2017)  Stress: No Stress Concern Present (08/20/2017)  Tobacco Use: Medium Risk (06/29/2022)   SDOH Interventions:     Readmission Risk Interventions     No data to display

## 2022-07-02 DIAGNOSIS — I5033 Acute on chronic diastolic (congestive) heart failure: Secondary | ICD-10-CM | POA: Diagnosis not present

## 2022-07-02 LAB — BASIC METABOLIC PANEL
Anion gap: 10 (ref 5–15)
BUN: 31 mg/dL — ABNORMAL HIGH (ref 8–23)
CO2: 27 mmol/L (ref 22–32)
Calcium: 8.3 mg/dL — ABNORMAL LOW (ref 8.9–10.3)
Chloride: 99 mmol/L (ref 98–111)
Creatinine, Ser: 2.32 mg/dL — ABNORMAL HIGH (ref 0.61–1.24)
GFR, Estimated: 29 mL/min — ABNORMAL LOW (ref >60)
Glucose, Bld: 233 mg/dL — ABNORMAL HIGH (ref 70–99)
Potassium: 3.4 mmol/L — ABNORMAL LOW (ref 3.5–5.1)
Sodium: 136 mmol/L (ref 135–145)

## 2022-07-02 LAB — GLUCOSE, CAPILLARY
Glucose-Capillary: 215 mg/dL — ABNORMAL HIGH (ref 70–99)
Glucose-Capillary: 257 mg/dL — ABNORMAL HIGH (ref 70–99)
Glucose-Capillary: 281 mg/dL — ABNORMAL HIGH (ref 70–99)
Glucose-Capillary: 171 mg/dL — ABNORMAL HIGH (ref 70–99)

## 2022-07-02 MED ORDER — POTASSIUM CHLORIDE CRYS ER 20 MEQ PO TBCR
40.0000 meq | EXTENDED_RELEASE_TABLET | Freq: Two times a day (BID) | ORAL | Status: AC
  Administered 2022-07-02 (×2): 40 meq via ORAL
  Filled 2022-07-02 (×2): qty 2

## 2022-07-02 MED ORDER — GUAIFENESIN ER 600 MG PO TB12
600.0000 mg | ORAL_TABLET | Freq: Two times a day (BID) | ORAL | Status: DC
  Administered 2022-07-02 – 2022-07-04 (×5): 600 mg via ORAL
  Filled 2022-07-02 (×5): qty 1

## 2022-07-02 NOTE — Progress Notes (Signed)
Mobility Specialist Progress Note:   07/02/22 1130  Mobility  Activity Dangled on edge of bed  Level of Assistance Minimal assist, patient does 75% or more  Assistive Device None  Activity Response Tolerated well  $Mobility charge 1 Mobility   Pt in bed willing to sit EOB to each lunch. No complaints of pain but declined getting up to bed d/t bottom hurting. Left EOB with call bell in reach and all needs met.   Gareth Eagle Jessicalynn Deshong Mobility Specialist Please contact via [employer] or  Rehab Office at [phone]

## 2022-07-02 NOTE — Care Management Important Message (Signed)
Important Message  Patient Details  Name: David [name] MRN: [id number] Date of Birth: 1945-12-05   Medicare Important Message Given:  Yes     Orbie Pyo 07/02/2022, 2:21 PM

## 2022-07-02 NOTE — Inpatient Diabetes Management (Signed)
Inpatient Diabetes Program Recommendations  AACE/ADA: New Consensus Statement on Inpatient Glycemic Control   Target Ranges:  Prepandial:   less than 140 mg/dL      Peak postprandial:   less than 180 mg/dL (1-2 hours)      Critically ill patients:  140 - 180 mg/dL    Latest Reference Range & Units 07/01/22 06:16 07/01/22 11:45 07/01/22 16:43 07/01/22 21:24 07/02/22 06:03  Glucose-Capillary 70 - 99 mg/dL 131 (H) 205 (H) 320 (H) 192 (H) 215 (H)    Review of Glycemic Control  Diabetes history: DM2 Outpatient Diabetes medications: Humalog 75/25 36 units BID Current orders for Inpatient glycemic control: Novolog 0-20 units TID with meals  Inpatient Diabetes Program Recommendations:    Insulin: Please consider ordering Semglee 5 units Q24H, adding Novolog 3 units TID with meals for meal coverage if patient eats at least 50% of meals, and decreasing Novolog to 0-9 units TID with meals.  Thanks, Barnie Alderman, RN, MSN, [place] Diabetes Coordinator Inpatient Diabetes Program [phone] (Team Pager from 8am to [place])

## 2022-07-03 DIAGNOSIS — I5033 Acute on chronic diastolic (congestive) heart failure: Secondary | ICD-10-CM | POA: Diagnosis not present

## 2022-07-03 LAB — BASIC METABOLIC PANEL
Anion gap: 11 (ref 5–15)
BUN: 43 mg/dL — ABNORMAL HIGH (ref 8–23)
CO2: 26 mmol/L (ref 22–32)
Calcium: 8.2 mg/dL — ABNORMAL LOW (ref 8.9–10.3)
Chloride: 98 mmol/L (ref 98–111)
Creatinine, Ser: 2.86 mg/dL — ABNORMAL HIGH (ref 0.61–1.24)
GFR, Estimated: 22 mL/min — ABNORMAL LOW (ref >60)
Glucose, Bld: 240 mg/dL — ABNORMAL HIGH (ref 70–99)
Potassium: 4 mmol/L (ref 3.5–5.1)
Sodium: 135 mmol/L (ref 135–145)

## 2022-07-03 LAB — CBC
HCT: 25.4 % — ABNORMAL LOW (ref 39.0–52.0)
Hemoglobin: 8.3 g/dL — ABNORMAL LOW (ref 13.0–17.0)
MCH: 34.4 pg — ABNORMAL HIGH (ref 26.0–34.0)
MCHC: 32.7 g/dL (ref 30.0–36.0)
MCV: 105.4 fL — ABNORMAL HIGH (ref 80.0–100.0)
Platelets: 216 10*3/uL (ref 150–400)
RBC: 2.41 MIL/uL — ABNORMAL LOW (ref 4.22–5.81)
RDW: 12.4 % (ref 11.5–15.5)
WBC: 9.5 10*3/uL (ref 4.0–10.5)
nRBC: 0 % (ref 0.0–0.2)

## 2022-07-03 LAB — GLUCOSE, CAPILLARY
Glucose-Capillary: 210 mg/dL — ABNORMAL HIGH (ref 70–99)
Glucose-Capillary: 266 mg/dL — ABNORMAL HIGH (ref 70–99)
Glucose-Capillary: 189 mg/dL — ABNORMAL HIGH (ref 70–99)
Glucose-Capillary: 248 mg/dL — ABNORMAL HIGH (ref 70–99)

## 2022-07-03 MED ORDER — INSULIN DETEMIR 100 UNIT/ML ~~LOC~~ SOLN
10.0000 [IU] | Freq: Every day | SUBCUTANEOUS | Status: DC
  Administered 2022-07-03 – 2022-07-04 (×2): 10 [IU] via SUBCUTANEOUS
  Filled 2022-07-03 (×2): qty 0.1

## 2022-07-03 MED ORDER — ENOXAPARIN SODIUM 30 MG/0.3ML IJ SOSY
30.0000 mg | PREFILLED_SYRINGE | INTRAMUSCULAR | Status: DC
  Administered 2022-07-03: 30 mg via SUBCUTANEOUS
  Filled 2022-07-03: qty 0.3

## 2022-07-03 NOTE — Progress Notes (Signed)
Occupational Therapy Treatment Patient Details Name: David [name] MRN: [id number] DOB: 1946-02-06 Today's Date: 07/03/2022   History of present illness 77 yo male adm 2/11 with the past medical history of coronary artery diease, heart failure, pulmonary hypertension, and CKD who presented with dyspnea, and diagnosed with decompensated heart failure.   OT comments  Patient with abbreviated session, walked with PT earlier, and still recovering.  Agreed to short sit edge of bed, and a few exercises as outlined below.  Patient continues to need acute level OT to address deficits listed, and he continues to plan on discharge home.  David [name] OT can be considered, but it appears he is probably beginning to need increasing assist and support at home.  Patient is concerned about the 3 stairs to enter his home, but anticipates discharging home tomorrow.  When SNF level rehab is discussed, he declines.     Recommendations for follow up therapy are one component of a multi-disciplinary discharge planning process, led by the attending physician.  Recommendations may be updated based on patient status, additional functional criteria and insurance authorization.    Follow Up Recommendations  Home health OT     Assistance Recommended at Discharge Intermittent Supervision/Assistance  Patient can return home with the following  Assist for transportation;Assistance with cooking/housework;Direct supervision/assist for medications management;A lot of help with walking and/or transfers;A lot of help with bathing/dressing/bathroom   Equipment Recommendations       Recommendations for Other Services      Precautions / Restrictions Precautions Precautions: Fall Precaution Comments: 3 falls in 6 months. Watch SpO2 Restrictions Weight Bearing Restrictions: No       Mobility Bed Mobility Overal bed mobility: Needs Assistance Bed Mobility: Supine to Sit, Sit to Supine     Supine to sit: Supervision Sit to  supine: Supervision        Transfers                   General transfer comment: declined     Balance Overall balance assessment: Needs assistance Sitting-balance support: Feet supported Sitting balance-Leahy Scale: Good                                     ADL either performed or assessed with clinical judgement   ADL                                              Extremity/Trunk Assessment Upper Extremity Assessment Upper Extremity Assessment: Generalized weakness                              Cognition Arousal/Alertness: Awake/alert Behavior During Therapy: WFL for tasks assessed/performed Overall Cognitive Status: Impaired/Different from baseline                                          Exercises General Exercises - Upper Extremity Shoulder Flexion: AROM, Both, 10 reps, Seated General Exercises - Lower Extremity Quad Sets: AROM, Seated, Both, 10 reps Hip Flexion/Marching: AROM, Both, 10 reps, Seated    Shoulder Instructions       General Comments SpO2 86% amb on RA. SpO2 94%  amb on 2L    Pertinent Vitals/ Pain       Pain Assessment Pain Assessment: Faces Faces Pain Scale: Hurts a little bit Pain Location: generalized Pain Descriptors / Indicators: Aching, Discomfort, Grimacing Pain Intervention(s): Monitored during session                                                          Frequency  Min 2X/week        Progress Toward Goals  OT Goals(current goals can now be found in the care plan section)  Progress towards OT goals: Progressing toward goals  Acute Rehab OT Goals OT Goal Formulation: With patient Time For Goal Achievement: 07/14/22 Potential to Achieve Goals: David [name] Discharge plan remains appropriate    Co-evaluation                 AM-PAC OT "6 Clicks" Daily Activity     Outcome Measure   Help from another person eating  meals?: None Help from another person taking care of personal grooming?: None Help from another person toileting, which includes using toliet, bedpan, or urinal?: A Lot Help from another person bathing (including washing, rinsing, drying)?: A Lot Help from another person to put on and taking off regular upper body clothing?: A Little Help from another person to put on and taking off regular lower body clothing?: A Lot 6 Click Score: 17    End of Session Equipment Utilized During Treatment: Oxygen  OT Visit Diagnosis: Unsteadiness on feet (R26.81);Muscle weakness (generalized) (M62.81);Pain   Activity Tolerance Patient limited by fatigue   Patient Left in bed;with call bell/phone within reach   Nurse Communication Mobility status        Time: 1425-1435 OT Time Calculation (min): 10 min  Charges: OT General Charges $OT Visit: 1 Visit OT Treatments $Self Care/Home Management : 8-22 mins  07/03/2022  RP, OTR/L  Acute Rehabilitation Services  Office:  [phone]   Metta Clines 07/03/2022, 2:39 PM

## 2022-07-03 NOTE — Progress Notes (Addendum)
Physical Therapy Treatment Patient Details Name: David [name] MRN: [id number] DOB: 1945/07/19 Today's Date: 07/03/2022   History of Present Illness 77 yo male adm 2/11 with the past medical history of coronary artery diease, heart failure, pulmonary hypertension, and CKD who presented with dyspnea, and diagnosed with decompensated heart failure.    PT Comments    Pt making slow progress with mobility. Able to amb short distance in room with assist. Is requiring O2 to maintain adequate SpO2. Will continue to work on incr activity tolerance and mobility.  Pt will likely need to use w/c for anything other than household distance at home.   Recommendations for follow up therapy are one component of a multi-disciplinary discharge planning process, led by the attending physician.  Recommendations may be updated based on patient status, additional functional criteria and insurance authorization.  Follow Up Recommendations  Home health PT     Assistance Recommended at Discharge Frequent or constant Supervision/Assistance  Patient can return home with the following A little help with walking and/or transfers;Help with stairs or ramp for entrance;Assist for transportation;A lot of help with bathing/dressing/bathroom   Equipment Recommendations  None recommended by PT    Recommendations for Other Services       Precautions / Restrictions Precautions Precautions: Fall;Other (comment) Precaution Comments: 3 falls in 6 months. Watch SpO2 Restrictions Weight Bearing Restrictions: No     Mobility  Bed Mobility Overal bed mobility: Needs Assistance Bed Mobility: Supine to Sit, Sit to Supine     Supine to sit: Supervision, HOB elevated Sit to supine: Supervision   General bed mobility comments: Incr time and effort but no physical assist. Sleeps in recliner at home    Transfers Overall transfer level: Needs assistance Equipment used: Rolling walker (2 wheels) Transfers: Sit  to/from Stand Sit to Stand: Min assist, Min guard           General transfer comment: Min assist on initial stand to bring hips up. Min guard on subsequent stands. Pt stands with trunk flexed forward bringing hips up and then extending trunk. Unable to fully extend trunk (baseline).    Ambulation/Gait Ambulation/Gait assistance: Min assist Gait Distance (Feet): 12 Feet (x 2)   Gait Pattern/deviations: Step-to pattern, Wide base of support, Trunk flexed, Decreased dorsiflexion - right, Decreased dorsiflexion - left Gait velocity: decr Gait velocity interpretation: <1.31 ft/sec, indicative of household ambulator   General Gait Details: Assist for balance and support. Pt with heavy support on walker. Verbal cues to stay closer to walker as pt tends to get too far behind walker which is less stable/supportive.   Stairs             Wheelchair Mobility    Modified Rankin (Stroke Patients Only)       Balance Overall balance assessment: Needs assistance Sitting-balance support: Feet supported, No upper extremity supported Sitting balance-Leahy Scale: Good     Standing balance support: During functional activity, Bilateral upper extremity supported Standing balance-Leahy Scale: Poor Standing balance comment: heavy UE support on walker and min guard for static standing                            Cognition Arousal/Alertness: Awake/alert Behavior During Therapy: WFL for tasks assessed/performed Overall Cognitive Status: Impaired/Different from baseline Area of Impairment: Safety/judgement, Problem solving                         Safety/Judgement:  Decreased awareness of safety, Decreased awareness of deficits   Problem Solving: Requires verbal cues, Slow processing          Exercises      General Comments General comments (skin integrity, edema, etc.): SpO2 86% amb on RA. SpO2 94% amb on 2L      Pertinent Vitals/Pain Pain Assessment Pain  Assessment: No/denies pain    Home Living                          Prior Function            PT Goals (current goals can now be found in the care plan section) Acute Rehab PT Goals Patient Stated Goal: to go home Progress towards PT goals: Progressing toward goals (slow)    Frequency    Min 3X/week      PT Plan Current plan remains appropriate    Co-evaluation              AM-PAC PT "6 Clicks" Mobility   Outcome Measure  Help needed turning from your back to your side while in a flat bed without using bedrails?: A Little Help needed moving from lying on your back to sitting on the side of a flat bed without using bedrails?: A Little Help needed moving to and from a bed to a chair (including a wheelchair)?: A Little Help needed standing up from a chair using your arms (e.g., wheelchair or bedside chair)?: A Little Help needed to walk in hospital room?: Total Help needed climbing 3-5 steps with a railing? : Total 6 Click Score: 14    End of Session Equipment Utilized During Treatment: Gait belt;Oxygen Activity Tolerance: Patient limited by fatigue Patient left: in bed;with call bell/phone within reach;with bed alarm set Nurse Communication: Mobility status;Other (comment) (ambulatory SpO2) PT Visit Diagnosis: Other abnormalities of gait and mobility (R26.89);History of falling (Z91.81);Other symptoms and signs involving the nervous system (R29.898)     Time: [id number] PT Time Calculation (min) (ACUTE ONLY): 40 min  Charges:  $Gait Training: 38-52 mins                     [place] [facility] [place] 07/03/2022, 11:53 AM

## 2022-07-03 NOTE — Inpatient Diabetes Management (Addendum)
Inpatient Diabetes Program Recommendations  AACE/ADA: New Consensus Statement on Inpatient Glycemic Control   Target Ranges:  Prepandial:   less than 140 mg/dL      Peak postprandial:   less than 180 mg/dL (1-2 hours)      Critically ill patients:  140 - 180 mg/dL    Latest Reference Range & Units 07/03/22 06:22  Glucose-Capillary 70 - 99 mg/dL 210 (H)  Novolog 7 units    Latest Reference Range & Units 07/02/22 06:03 07/02/22 11:16 07/02/22 16:12 07/02/22 21:11  Glucose-Capillary 70 - 99 mg/dL 215 (H)  Novolog 7 units 257 (H)  Novolog 11 units 281 (H)  Novolog 11 units 171 (H)    Review of Glycemic Control  Diabetes history: DM2 Outpatient Diabetes medications: Humalog 75/25 36 units BID Current orders for Inpatient glycemic control: Novolog 0-20 units TID with meals   Inpatient Diabetes Program Recommendations:     Insulin: Please consider ordering Semglee 7 units Q24H, adding Novolog 4 units TID with meals for meal coverage if patient eats at least 50% of meals, and decreasing Novolog to 0-9 units TID with meals.   Thanks, Barnie Alderman, RN, MSN, [place] Diabetes Coordinator Inpatient Diabetes Program [phone] (Team Pager from 8am to [place])

## 2022-07-03 NOTE — Plan of Care (Signed)

## 2022-07-03 NOTE — Plan of Care (Signed)
  Problem: Education: Goal: Ability to describe self-care measures that may prevent or decrease complications (Diabetes Survival Skills Education) will improve Outcome: Progressing Goal: Individualized Educational Video(s) Outcome: Progressing   Problem: Coping: Goal: Ability to adjust to condition or change in health will improve Outcome: Progressing   Problem: Fluid Volume: Goal: Ability to maintain a balanced intake and output will improve Outcome: Progressing   Problem: Health Behavior/Discharge Planning: Goal: Ability to identify and utilize available resources and services will improve Outcome: Progressing Goal: Ability to manage health-related needs will improve Outcome: Progressing   Problem: Metabolic: Goal: Ability to maintain appropriate glucose levels will improve Outcome: Progressing   Problem: Nutritional: Goal: Maintenance of adequate nutrition will improve Outcome: Progressing Goal: Progress toward achieving an optimal weight will improve Outcome: Progressing   Problem: Skin Integrity: Goal: Risk for impaired skin integrity will decrease Outcome: Progressing   Problem: Tissue Perfusion: Goal: Adequacy of tissue perfusion will improve Outcome: Progressing   Problem: Education: Goal: Knowledge of General Education information will improve Description: Including pain rating scale, medication(s)/side effects and non-pharmacologic comfort measures Outcome: Progressing   Problem: Health Behavior/Discharge Planning: Goal: Ability to manage health-related needs will improve Outcome: Progressing   Problem: Clinical Measurements: Goal: Ability to maintain clinical measurements within normal limits will improve Outcome: Progressing Goal: Will remain free from infection Outcome: Progressing Goal: Diagnostic test results will improve Outcome: Progressing Goal: Respiratory complications will improve Outcome: Progressing Goal: Cardiovascular complication will  be avoided Outcome: Progressing   Problem: Activity: Goal: Risk for activity intolerance will decrease Outcome: Progressing   Problem: Nutrition: Goal: Adequate nutrition will be maintained Outcome: Progressing   Problem: Coping: Goal: Level of anxiety will decrease Outcome: Progressing   Problem: Elimination: Goal: Will not experience complications related to bowel motility Outcome: Progressing Goal: Will not experience complications related to urinary retention Outcome: Progressing   Problem: Pain Managment: Goal: General experience of comfort will improve Outcome: Progressing   Problem: Safety: Goal: Ability to remain free from injury will improve Outcome: Progressing   Problem: Skin Integrity: Goal: Risk for impaired skin integrity will decrease Outcome: Progressing   Problem: Education: Goal: Ability to demonstrate management of disease process will improve Outcome: Progressing Goal: Ability to verbalize understanding of medication therapies will improve Outcome: Progressing Goal: Individualized Educational Video(s) Outcome: Progressing   Problem: Activity: Goal: Capacity to carry out activities will improve Outcome: Progressing   Problem: Cardiac: Goal: Ability to achieve and maintain adequate cardiopulmonary perfusion will improve Outcome: Progressing   

## 2022-07-03 NOTE — Progress Notes (Signed)
PROGRESS NOTE    David [name]  [id number] DOB: [birth year] DOA: 06/29/2022 PCP: [name], MD   [id number] w/ CAD, diastolic CHF, pulmonary hypertension, and CKD who presented with dyspnea. He discontinued torsemide 4 weeks ago due to polyuria. Reported drinking about a gallon of orange juice the week prior.  He reported acute onset of dyspnea, O2 sats 85% In ED,  cr 1,87, BNP 243, troponin 18 and 90, CXR w/ with bilateral hilar vascular congestion and bilateral interstitial symmetrical infiltrates.  -02/13 this morning patient declined blood work, his symptoms are improving.  -2/14 adamant to leave tomorrow regardless of what we say, considering AMA -2/15, creatinine bumped to 2.8, holding diuretics  Subjective: Feels better overall, off oxygen, creatinine bumped to   Assessment and Plan:  Acute on chronic diastolic CHF  Pulm HTN Echo with preserved LV systolic function with EF 50 to 55%, mild LVH, RV systolic function preserved, -diuresing on IV lasix, he is 6.9 L negative, volume status has improved considerably, unfortunately creatinine has bumped to 2.8 this morning, hold IV Lasix -continue Bidil, metoprolol.  -GDMT limited by CKD -BMP in a.m. -Increase activity, weaned off oxygen  Essential hypertension -amlodipine, metoprolol, and Bidil  Stage 3b chronic kidney disease ([place]) Patient had declined blood work yesterday -baseline creat around 2, now 2.3 -Monitor  Anemia of chronic renal disease,  -stable Hgb   Type 2 diabetes mellitus with hyperlipidemia (HCC) -Add Levemir  PAD (peripheral artery disease) (HCC) Continue blood pressure control, aspirin and statin therapy. .  Class 1 obesity Calculated BMI 30,5     DVT prophylaxis: lovenox Code Status: Full code Family Communication: None present Disposition Plan: Home likely 1 to 2 days  Consultants:    Procedures:   Antimicrobials:    Objective: Vitals:   07/03/22 0843 07/03/22 0844 07/03/22  0845 07/03/22 0846  BP:    130/60  Pulse: (!) 58 (!) 58 (!) 59 (!) 59  Resp: 11 16 15 12  $ Temp:    98.6 F (37 C)  TempSrc:    Oral  SpO2: 93% 92% 93% 95%  Weight:      Height:        Intake/Output Summary (Last 24 hours) at 07/03/2022 1138 Last data filed at 07/03/2022 0900 Gross per 24 hour  Intake 243 ml  Output 1600 ml  Net -1357 ml   Filed Weights   06/30/22 0433 07/02/22 0505 07/03/22 0417  Weight: 105 kg 102.6 kg 102.2 kg    Examination:  General exam: Chronically ill male sitting up in bed, AAOx3, no distress off oxygen this morning CVS: S1-S2, regular rhythm Lungs: Decreased breath sounds at the bases otherwise clear Abdomen: Soft, nontender, bowel sounds present Extremities: Trace edema  Skin: No rashes Psychiatry:  Mood & affect appropriate.     Data Reviewed:   CBC: Recent Labs  Lab 06/29/22 0454 07/03/22 0015  WBC 11.8* 9.5  NEUTROABS 10.4*  --   HGB 10.8* 8.3*  HCT 32.8* 25.4*  MCV 105.8* 105.4*  PLT 257 [id number]   Basic Metabolic Panel: Recent Labs  Lab 06/29/22 0454 06/30/22 0025 07/01/22 1544 07/02/22 0757 07/03/22 0015  NA 134* 136 137 136 135  K 3.9 3.6 3.9 3.4* 4.0  CL 101 103 102 99 98  CO2 21* 26 26 27 26  $ GLUCOSE 462* 124* 358* 233* 240*  BUN 21 21 31* 31* 43*  CREATININE 1.87* 2.09* 2.53* 2.32* 2.86*  CALCIUM 8.8* 8.7* 8.2* 8.3* 8.2*   GFR: Estimated  Creatinine Clearance: 28 mL/min (A) (by C-G formula based on SCr of 2.86 mg/dL (H)). Liver Function Tests: No results for input(s): "AST", "ALT", "ALKPHOS", "BILITOT", "PROT", "ALBUMIN" in the last 168 hours. No results for input(s): "LIPASE", "AMYLASE" in the last 168 hours. No results for input(s): "AMMONIA" in the last 168 hours. Coagulation Profile: No results for input(s): "INR", "PROTIME" in the last 168 hours. Cardiac Enzymes: No results for input(s): "CKTOTAL", "CKMB", "CKMBINDEX", "TROPONINI" in the last 168 hours. BNP (last 3 results) No results for input(s):  "PROBNP" in the last 8760 hours. HbA1C: No results for input(s): "HGBA1C" in the last 72 hours.  CBG: Recent Labs  Lab 07/02/22 1116 07/02/22 1612 07/02/22 2111 07/03/22 0622 07/03/22 1134  GLUCAP 257* 281* 171* 210* 266*   Lipid Profile: No results for input(s): "CHOL", "HDL", "LDLCALC", "TRIG", "CHOLHDL", "LDLDIRECT" in the last 72 hours. Thyroid Function Tests: No results for input(s): "TSH", "T4TOTAL", "FREET4", "T3FREE", "THYROIDAB" in the last 72 hours. Anemia Panel: No results for input(s): "VITAMINB12", "FOLATE", "FERRITIN", "TIBC", "IRON", "RETICCTPCT" in the last 72 hours. Urine analysis:    Component Value Date/Time   COLORURINE YELLOW 08/15/2018 0911   APPEARANCEUR HAZY (A) 08/15/2018 0911   LABSPEC 1.021 08/15/2018 0911   PHURINE 5.0 08/15/2018 0911   GLUCOSEU 150 (A) 08/15/2018 0911   HGBUR SMALL (A) 08/15/2018 0911   BILIRUBINUR NEGATIVE 08/15/2018 0911   KETONESUR 5 (A) 08/15/2018 0911   PROTEINUR 100 (A) 08/15/2018 0911   UROBILINOGEN 1.0 02/13/2015 1934   NITRITE NEGATIVE 08/15/2018 0911   LEUKOCYTESUR NEGATIVE 08/15/2018 0911   Sepsis Labs: @LABRCNTIP$ (procalcitonin:4,lacticidven:4)  ) Recent Results (from the past 240 hour(s))  Resp panel by RT-PCR (RSV, Flu A&B, Covid) Anterior Nasal Swab     Status: None   Collection Time: 06/29/22  4:55 AM   Specimen: Anterior Nasal Swab  Result Value Ref Range Status   SARS Coronavirus 2 by RT PCR NEGATIVE NEGATIVE Final   Influenza A by PCR NEGATIVE NEGATIVE Final   Influenza B by PCR NEGATIVE NEGATIVE Final    Comment: (NOTE) The Xpert Xpress SARS-CoV-2/FLU/RSV plus assay is intended as an aid in the diagnosis of influenza from Nasopharyngeal swab specimens and should not be used as a sole basis for treatment. Nasal washings and aspirates are unacceptable for Xpert Xpress SARS-CoV-2/FLU/RSV testing.  Fact Sheet for Patients: EntrepreneurPulse.com.au  Fact Sheet for Healthcare  Providers: IncredibleEmployment.be  This test is not yet approved or cleared by the [place] FDA and has been authorized for detection and/or diagnosis of SARS-CoV-2 by FDA under an Emergency Use Authorization (EUA). This EUA will remain in effect (meaning this test can be used) for the duration of the COVID-19 declaration under Section 564(b)(1) of the Act, 21 U.S.C. section 360bbb-3(b)(1), unless the authorization is terminated or revoked.     Resp Syncytial Virus by PCR NEGATIVE NEGATIVE Final    Comment: (NOTE) Fact Sheet for Patients: EntrepreneurPulse.com.au  Fact Sheet for Healthcare Providers: IncredibleEmployment.be  This test is not yet approved or cleared by the [place] FDA and has been authorized for detection and/or diagnosis of SARS-CoV-2 by FDA under an Emergency Use Authorization (EUA). This EUA will remain in effect (meaning this test can be used) for the duration of the COVID-19 declaration under Section 564(b)(1) of the Act, 21 U.S.C. section 360bbb-3(b)(1), unless the authorization is terminated or revoked.  Performed at [facility], [place] [address], [place], [place] [address]      Radiology Studies: No results found.  Scheduled Meds:  amLODipine  10 mg Oral Daily   aspirin EC  81 mg Oral Daily   atorvastatin  40 mg Oral Daily   dicyclomine  10 mg Oral TID AC & HS   docusate sodium  100 mg Oral Daily   dorzolamide-timolol  1 drop Both Eyes BID   enoxaparin (LOVENOX) injection  40 mg Subcutaneous Q24H   ferrous sulfate  325 mg Oral Q breakfast   guaiFENesin  600 mg Oral BID   insulin aspart  0-20 Units Subcutaneous TID WC   isosorbide-hydrALAZINE  2 tablet Oral TID   metoprolol succinate  100 mg Oral Daily   sodium chloride flush  3 mL Intravenous Q12H   traZODone  100 mg Oral QHS   Continuous Infusions:  sodium chloride       LOS: 4 days    Time spent:  80mn    PDomenic Polite MD [facility]   07/03/2022, 11:38 AM

## 2022-07-04 LAB — GLUCOSE, CAPILLARY
Glucose-Capillary: 181 mg/dL — ABNORMAL HIGH (ref 70–99)
Glucose-Capillary: 193 mg/dL — ABNORMAL HIGH (ref 70–99)

## 2022-07-04 NOTE — Progress Notes (Addendum)
Mobility Specialist Progress Note:   07/04/22 0937  Mobility  Activity Refused mobility   09:37 am: Pt in bed stating he's tired and needs to nap. Will follow-up as time allows.   11:50 am: Attempted to see pt again and he became frustrated with not feeling better and feeling like he's a "pin cushion" here. Tried to encourage movement but pt refused.   Gareth Eagle Adelyna Brockman Mobility Specialist Please contact via [employer] or  Rehab Office at [phone]

## 2022-07-04 NOTE — Progress Notes (Addendum)
PROGRESS NOTE    David [name]  [id number] DOB: 1945/12/08 DOA: 06/29/2022 PCP: [name], MD   [id number] w/ CAD, diastolic CHF, pulmonary hypertension, and CKD who presented with dyspnea. He discontinued torsemide 4 weeks ago due to polyuria. Reported drinking about a gallon of orange juice the week prior.  He reported acute onset of dyspnea, O2 sats 85% In ED,  cr 1,87, BNP 243, troponin 18 and 90, CXR w/ with bilateral hilar vascular congestion and bilateral interstitial symmetrical infiltrates.  -02/13 this morning patient declined blood work, his symptoms are improving.  -2/14 adamant to leave tomorrow regardless of what we say, considering AMA -2/15, creatinine bumped to 2.8, holding diuretics 2/16: Refused labs, again threatens to leave   Subjective: Feels better overall, off oxygen, creatinine bumped to   Assessment and Plan:  Acute on chronic diastolic CHF  Pulm HTN Echo with preserved LV systolic function with EF 50 to 55%, mild LVH, RV systolic function preserved, -diuresing on IV lasix, he is 6.9 L negative, volume status has improved considerably, unfortunately creatinine has bumped to 2.8 yesterday, IV Lasix held, patient refused labs earlier this morning, requested labs to be done later this morning, poor compliance even in the hospital limits and complicates patient care -continue Bidil, metoprolol.  -GDMT limited by CKD -BMP in a.m.  Essential hypertension -amlodipine, metoprolol, and Bidil  Stage 3b chronic kidney disease ([place]) Patient had declined blood work 2/13, after much pleading agreed to labs 2/15 -baseline creat around 2,, trended up to 2.8 yesterday, diuretics held, Lasix on hold -Refuses labs again today, after seeing him this morning and requesting a.m. labs I called him in the room and requested labs again which he adamantly refused  Anemia of chronic renal disease,  -stable Hgb   Type 2 diabetes mellitus with hyperlipidemia ([place]) -Continue  Levemir  PAD (peripheral artery disease) ([place]) Continue blood pressure control, aspirin and statin therapy. .  Class 1 obesity Calculated BMI 30,5   DVT prophylaxis: lovenox Code Status: Full code Family Communication: None present Disposition Plan: Home likely 1 to 2 days  Consultants:    Procedures:   Antimicrobials:    Objective: Vitals:   07/03/22 2302 07/04/22 0640 07/04/22 0745 07/04/22 1045  BP: 118/81 (!) 164/78 130/63 (!) 114/100  Pulse: 62 65 63 66  Resp: 15 16 12 15  $ Temp: 97.7 F (36.5 C) 97.6 F (36.4 C) 97.7 F (36.5 C) 97.7 F (36.5 C)  TempSrc: Oral Oral Oral Oral  SpO2: 97% 94% 91% 92%  Weight:  103.1 kg    Height:        Intake/Output Summary (Last 24 hours) at 07/04/2022 1111 Last data filed at 07/04/2022 [id number] Gross per 24 hour  Intake --  Output 1050 ml  Net -1050 ml   Filed Weights   07/02/22 0505 07/03/22 0417 07/04/22 0640  Weight: 102.6 kg 102.2 kg 103.1 kg    Examination:  General exam: Chronically ill male sitting up in bed, AAOx3, no distress CVS: S1-S2, regular rhythm Lungs: Decreased breath sounds to bases Abdomen: Soft, nontender, bowel sounds present Remedies: No edema  Skin: No rashes Psychiatry:  Mood & affect appropriate.     Data Reviewed:   CBC: Recent Labs  Lab 06/29/22 0454 07/03/22 0015  WBC 11.8* 9.5  NEUTROABS 10.4*  --   HGB 10.8* 8.3*  HCT 32.8* 25.4*  MCV 105.8* 105.4*  PLT 257 [id number]   Basic Metabolic Panel: Recent Labs  Lab 06/29/22 0454 06/30/22 0025  07/01/22 1544 07/02/22 0757 07/03/22 0015  NA 134* 136 137 136 135  K 3.9 3.6 3.9 3.4* 4.0  CL 101 103 102 99 98  CO2 21* 26 26 27 26  $ GLUCOSE 462* 124* 358* 233* 240*  BUN 21 21 31* 31* 43*  CREATININE 1.87* 2.09* 2.53* 2.32* 2.86*  CALCIUM 8.8* 8.7* 8.2* 8.3* 8.2*   GFR: Estimated Creatinine Clearance: 28.2 mL/min (A) (by C-G formula based on SCr of 2.86 mg/dL (H)). Liver Function Tests: No results for input(s): "AST", "ALT",  "ALKPHOS", "BILITOT", "PROT", "ALBUMIN" in the last 168 hours. No results for input(s): "LIPASE", "AMYLASE" in the last 168 hours. No results for input(s): "AMMONIA" in the last 168 hours. Coagulation Profile: No results for input(s): "INR", "PROTIME" in the last 168 hours. Cardiac Enzymes: No results for input(s): "CKTOTAL", "CKMB", "CKMBINDEX", "TROPONINI" in the last 168 hours. BNP (last 3 results) No results for input(s): "PROBNP" in the last 8760 hours. HbA1C: No results for input(s): "HGBA1C" in the last 72 hours.  CBG: Recent Labs  Lab 07/03/22 1134 07/03/22 1634 07/03/22 2155 07/04/22 0636 07/04/22 1043  GLUCAP 266* 189* 248* 181* 193*   Lipid Profile: No results for input(s): "CHOL", "HDL", "LDLCALC", "TRIG", "CHOLHDL", "LDLDIRECT" in the last 72 hours. Thyroid Function Tests: No results for input(s): "TSH", "T4TOTAL", "FREET4", "T3FREE", "THYROIDAB" in the last 72 hours. Anemia Panel: No results for input(s): "VITAMINB12", "FOLATE", "FERRITIN", "TIBC", "IRON", "RETICCTPCT" in the last 72 hours. Urine analysis:    Component Value Date/Time   COLORURINE YELLOW 08/15/2018 0911   APPEARANCEUR HAZY (A) 08/15/2018 0911   LABSPEC 1.021 08/15/2018 0911   PHURINE 5.0 08/15/2018 0911   GLUCOSEU 150 (A) 08/15/2018 0911   HGBUR SMALL (A) 08/15/2018 0911   BILIRUBINUR NEGATIVE 08/15/2018 0911   KETONESUR 5 (A) 08/15/2018 0911   PROTEINUR 100 (A) 08/15/2018 0911   UROBILINOGEN 1.0 02/13/2015 1934   NITRITE NEGATIVE 08/15/2018 0911   LEUKOCYTESUR NEGATIVE 08/15/2018 0911   Sepsis Labs: @LABRCNTIP$ (procalcitonin:4,lacticidven:4)  ) Recent Results (from the past 240 hour(s))  Resp panel by RT-PCR (RSV, Flu A&B, Covid) Anterior Nasal Swab     Status: None   Collection Time: 06/29/22  4:55 AM   Specimen: Anterior Nasal Swab  Result Value Ref Range Status   SARS Coronavirus 2 by RT PCR NEGATIVE NEGATIVE Final   Influenza A by PCR NEGATIVE NEGATIVE Final   Influenza B by  PCR NEGATIVE NEGATIVE Final    Comment: (NOTE) The Xpert Xpress SARS-CoV-2/FLU/RSV plus assay is intended as an aid in the diagnosis of influenza from Nasopharyngeal swab specimens and should not be used as a sole basis for treatment. Nasal washings and aspirates are unacceptable for Xpert Xpress SARS-CoV-2/FLU/RSV testing.  Fact Sheet for Patients: EntrepreneurPulse.com.au  Fact Sheet for Healthcare Providers: IncredibleEmployment.be  This test is not yet approved or cleared by the [place] FDA and has been authorized for detection and/or diagnosis of SARS-CoV-2 by FDA under an Emergency Use Authorization (EUA). This EUA will remain in effect (meaning this test can be used) for the duration of the COVID-19 declaration under Section 564(b)(1) of the Act, 21 U.S.C. section 360bbb-3(b)(1), unless the authorization is terminated or revoked.     Resp Syncytial Virus by PCR NEGATIVE NEGATIVE Final    Comment: (NOTE) Fact Sheet for Patients: EntrepreneurPulse.com.au  Fact Sheet for Healthcare Providers: IncredibleEmployment.be  This test is not yet approved or cleared by the [place] FDA and has been authorized for detection and/or diagnosis of SARS-CoV-2 by FDA  under an Emergency Use Authorization (EUA). This EUA will remain in effect (meaning this test can be used) for the duration of the COVID-19 declaration under Section 564(b)(1) of the Act, 21 U.S.C. section 360bbb-3(b)(1), unless the authorization is terminated or revoked.  Performed at [facility], [place] [address], [place], [place] [address]      Radiology Studies: No results found.   Scheduled Meds:  amLODipine  10 mg Oral Daily   aspirin EC  81 mg Oral Daily   atorvastatin  40 mg Oral Daily   dicyclomine  10 mg Oral TID AC & HS   docusate sodium  100 mg Oral Daily   dorzolamide-timolol  1 drop Both Eyes BID   enoxaparin  (LOVENOX) injection  30 mg Subcutaneous Q24H   ferrous sulfate  325 mg Oral Q breakfast   guaiFENesin  600 mg Oral BID   insulin aspart  0-20 Units Subcutaneous TID WC   insulin detemir  10 Units Subcutaneous Daily   isosorbide-hydrALAZINE  2 tablet Oral TID   metoprolol succinate  100 mg Oral Daily   sodium chloride flush  3 mL Intravenous Q12H   traZODone  100 mg Oral QHS   Continuous Infusions:  sodium chloride       LOS: 5 days    Time spent: 74mn    PDomenic Polite MD [facility]   07/04/2022, 11:11 AM

## 2022-07-04 NOTE — Progress Notes (Signed)
Patient refusing labs and requesting to be discharged. Notified provider and reiterated necessity of labs and care. Patient declining cares will leave AMA. AMA paperwork signed by patient and wife arrived to patient bedside to pick up patient.

## 2022-07-07 NOTE — Discharge Summary (Signed)
Physician Discharge Summary  David [name] [id number] DOB: August 25, 1945 DOA: 06/29/2022  PCP: [name], MD  Admit date: 06/29/2022 Discharge date: 07/04/2022  Time spent: 35 minutes  Recommendations for Outpatient Follow-up:  LEFT AMA   Discharge Diagnoses:  Principal Problem:   Acute on chronic diastolic CHF (congestive heart failure) ([place]) Active Problems:   Essential hypertension   Stage 3b chronic kidney disease ([place])   Type 2 diabetes mellitus with hyperlipidemia (HCC)   PAD (peripheral artery disease) ([place])   Class 1 obesity   Filed Weights   07/02/22 0505 07/03/22 0417 07/04/22 0640  Weight: 102.6 kg 102.2 kg 103.1 kg    History of present illness:  [id number] w/ CAD, diastolic CHF, pulmonary hypertension, and CKD who presented with dyspnea. He discontinued torsemide 4 weeks ago due to polyuria. Reported drinking about a gallon of orange juice the week prior.  He reported acute onset of dyspnea, O2 sats 85% In ED,  cr 1,87, BNP 243, troponin 18 and 90, CXR w/ with bilateral hilar vascular congestion and bilateral interstitial symmetrical infiltrates.  -02/13  morning patient declined blood work, his symptoms are improving.  -2/14 adamant to leave tomorrow regardless of what we say, considering AMA -2/15, creatinine bumped to 2.8, holding diuretics 2/16: Refused labs, again threatens to leave   Hospital Course:   Acute on chronic diastolic CHF  Pulm HTN Echo with preserved LV systolic function with EF 50 to 55%, mild LVH, RV systolic function preserved, -diuresing on IV lasix, he is 6.9 L negative, volume status has improved considerably, unfortunately creatinine has bumped to 2.8 yesterday, IV Lasix held, patient refused labs earlier this morning, requested labs to be done later this morning, poor compliance even in the hospital limits and complicates patient care -continue Bidil, metoprolol.  -GDMT limited by CKD -Patient continues to refuse labs this morning  he was advised of importance of compliance and how it complicates his care, then left AMA   Essential hypertension -amlodipine, metoprolol, and Bidil   Stage 3b chronic kidney disease ([place]) Patient had declined blood work 2/13, after much pleading agreed to labs 2/15 -baseline creat around 2,, trended up to 2.8 yesterday, diuretics held, Lasix on hold -Refuses labs again today, after seeing him this morning and requesting a.m. labs I called him in the room and requested labs again which he adamantly refused   Anemia of chronic renal disease,  -stable Hgb    Type 2 diabetes mellitus with hyperlipidemia ([place]) -Continue Levemir   PAD (peripheral artery disease) ([place]) Continue blood pressure control, aspirin and statin therapy. .   Class 1 obesity Calculated BMI 30,5    Discharge Exam: Vitals:   07/04/22 0745 07/04/22 1045  BP: 130/63 (!) 114/100  Pulse: 63 66  Resp: 12 15  Temp: 97.7 F (36.5 C) 97.7 F (36.5 C)  SpO2: 91% 92%     Discharge Instructions    Allergies as of 07/04/2022   No Known Allergies      Medication List     ASK your doctor about these medications    albuterol 108 (90 Base) MCG/ACT inhaler Commonly known as: VENTOLIN HFA Inhale 2 puffs into the lungs every 6 (six) hours as needed for wheezing or shortness of breath.   amLODipine 10 MG tablet Commonly known as: NORVASC Take 1 tablet (10 mg total) by mouth daily.   aspirin EC 81 MG tablet Take 81 mg by mouth daily.   atorvastatin 40 MG tablet Commonly known as: LIPITOR Take  1 tablet (40 mg total) by mouth daily.   dicyclomine 10 MG capsule Commonly known as: BENTYL Take 1 capsule (10 mg total) by mouth 4 (four) times daily -  before meals and at bedtime.   docusate sodium 100 MG capsule Commonly known as: COLACE Take 100 mg by mouth daily.   dorzolamide-timolol 2-0.5 % ophthalmic solution Commonly known as: COSOPT Place 1 drop into both eyes 2 (two) times daily.   ferrous  sulfate 325 (65 FE) MG tablet Take 325 mg by mouth daily with breakfast.   HumaLOG Mix 75/25 KwikPen (75-25) 100 UNIT/ML Kwikpen Generic drug: Insulin Lispro Prot & Lispro Inject 36 Units into the skin in the morning and at bedtime.   HYDROcodone-acetaminophen 10-325 MG tablet Commonly known as: NORCO Take 1 tablet by mouth daily.   isosorbide-hydrALAZINE 20-37.5 MG tablet Commonly known as: BIDIL Take 2 tablets by mouth three times daily.   metoprolol succinate 100 MG 24 hr tablet Commonly known as: TOPROL-XL Take 1 tablet (100 mg total) by mouth daily. TAKE WITH OR IMMEDIATELY FOLLOWING A MEAL.   MUCINEX PO Take 1,200 mg by mouth daily.   multivitamin with minerals Tabs tablet Take 1 tablet by mouth daily.   nitroGLYCERIN 0.4 MG SL tablet Commonly known as: NITROSTAT Place 1 tablet (0.4 mg total) under the tongue every 5 (five) minutes as needed for chest pain.   torsemide 20 MG tablet Commonly known as: DEMADEX Take 1 tablet by mouth twice daily.   traZODone 100 MG tablet Commonly known as: DESYREL Take 100 mg by mouth at bedtime.       No Known Allergies    The results of significant diagnostics from this hospitalization (including imaging, microbiology, ancillary and laboratory) are listed below for reference.    Significant Diagnostic Studies: ECHOCARDIOGRAM COMPLETE  Result Date: 06/30/2022    ECHOCARDIOGRAM REPORT   Patient Name:   CORDERRA [name] Date of Exam: 06/30/2022 Medical Rec #:  [id number]        Height:       73.0 in Accession #:    [id number]       Weight:       231.5 lb Date of Birth:  1945/10/31       BSA:          2.290 m Patient Age:    77 years         BP:           121/108 mmHg Patient Gender: M                HR:           68 bpm. Exam Location:  Inpatient Procedure: 2D Echo and Intracardiac Opacification Agent Indications:    CHF  History:        Patient has prior history of Echocardiogram examinations, most                 recent 12/22/2017.  PAD; Risk Factors:Diabetes and Hypertension.  Sonographer:    Harvie Junior Referring Phys: [phone] MICHAEL E NORINS  Sonographer Comments: Technically difficult study due to poor echo windows. IMPRESSIONS  1. Left ventricular ejection fraction, by estimation, is 50 to 55%. The left ventricle has low normal function. The left ventricle has no regional wall motion abnormalities. There is mild concentric left ventricular hypertrophy. Left ventricular diastolic parameters are indeterminate.  2. Right ventricular systolic function is normal. The right ventricular size is mildly enlarged. There is severely elevated pulmonary artery  systolic pressure. The estimated right ventricular systolic pressure is [id number] mmHg.  3. Left atrial size was mildly dilated.  4. The mitral valve is normal in structure. Trivial mitral valve regurgitation. No evidence of mitral stenosis.  5. The aortic valve is tricuspid. There is mild calcification of the aortic valve. There is mild thickening of the aortic valve. Aortic valve regurgitation is not visualized. Aortic valve sclerosis/calcification is present, without any evidence of aortic stenosis.  6. The inferior vena cava is dilated in size with <50% respiratory variability, suggesting right atrial pressure of 15 mmHg. Comparison(s): No significant change from prior study. Conclusion(s)/Recommendation(s): Severe pulmonary hypertension. FINDINGS  Left Ventricle: Left ventricular ejection fraction, by estimation, is 50 to 55%. The left ventricle has low normal function. The left ventricle has no regional wall motion abnormalities. Definity contrast agent was given IV to delineate the left ventricular endocardial borders. The left ventricular internal cavity size was normal in size. There is mild concentric left ventricular hypertrophy. Left ventricular diastolic parameters are indeterminate. Right Ventricle: The right ventricular size is mildly enlarged. Right vetricular wall thickness was not  well visualized. Right ventricular systolic function is normal. There is severely elevated pulmonary artery systolic pressure. The tricuspid regurgitant velocity is 3.71 m/s, and with an assumed right atrial pressure of 15 mmHg, the estimated right ventricular systolic pressure is [id number] mmHg. Left Atrium: Left atrial size was mildly dilated. Right Atrium: Right atrial size was normal in size. Pericardium: Trivial pericardial effusion is present. Mitral Valve: The mitral valve is normal in structure. Trivial mitral valve regurgitation. No evidence of mitral valve stenosis. Tricuspid Valve: The tricuspid valve is normal in structure. Tricuspid valve regurgitation is mild . No evidence of tricuspid stenosis. Aortic Valve: The aortic valve is tricuspid. There is mild calcification of the aortic valve. There is mild thickening of the aortic valve. Aortic valve regurgitation is not visualized. Aortic valve sclerosis/calcification is present, without any evidence of aortic stenosis. Aortic valve mean gradient measures 4.0 mmHg. Aortic valve peak gradient measures 7.0 mmHg. Aortic valve area, by VTI measures 2.18 cm. Pulmonic Valve: The pulmonic valve was grossly normal. Pulmonic valve regurgitation is trivial. No evidence of pulmonic stenosis. Aorta: The aortic root, ascending aorta, aortic arch and descending aorta are all structurally normal, with no evidence of dilitation or obstruction. Venous: The inferior vena cava is dilated in size with less than 50% respiratory variability, suggesting right atrial pressure of 15 mmHg. IAS/Shunts: The atrial septum is grossly normal. Additional Comments: There is a small pleural effusion in both left and right lateral regions.  LEFT VENTRICLE PLAX 2D LVIDd:         5.10 cm      Diastology LVIDs:         3.70 cm      LV e' medial:    6.22 cm/s LV PW:         1.20 cm      LV E/e' medial:  10.9 LV IVS:        1.10 cm      LV e' lateral:   8.39 cm/s LVOT diam:     2.10 cm      LV E/e'  lateral: 8.1 LV SV:         61 LV SV Index:   26 LVOT Area:     3.46 cm  LV Volumes (MOD) LV vol d, MOD A2C: 105.0 ml LV vol d, MOD A4C: 151.0 ml LV vol s, MOD A2C: 55.0 ml  LV vol s, MOD A4C: 68.7 ml LV SV MOD A2C:     50.0 ml LV SV MOD A4C:     151.0 ml LV SV MOD BP:      67.4 ml RIGHT VENTRICLE RV Basal diam:  4.20 cm RV Mid diam:    3.50 cm RV S prime:     10.40 cm/s TAPSE (M-mode): 2.5 cm LEFT ATRIUM           Index        RIGHT ATRIUM           Index LA diam:      4.10 cm 1.79 cm/m   RA Area:     18.50 cm LA Vol (A2C): 60.7 ml 26.51 ml/m  RA Volume:   55.60 ml  24.28 ml/m LA Vol (A4C): 73.1 ml 31.93 ml/m  AORTIC VALVE AV Area (Vmax):    2.23 cm AV Area (Vmean):   2.07 cm AV Area (VTI):     2.18 cm AV Vmax:           132.67 cm/s AV Vmean:          88.900 cm/s AV VTI:            0.278 m AV Peak Grad:      7.0 mmHg AV Mean Grad:      4.0 mmHg LVOT Vmax:         85.30 cm/s LVOT Vmean:        53.100 cm/s LVOT VTI:          0.175 m LVOT/AV VTI ratio: 0.63  AORTA Ao Root diam: 3.50 cm MITRAL VALVE               TRICUSPID VALVE MV Area (PHT): 4.06 cm    TR Peak grad:   55.1 mmHg MV Decel Time: 187 msec    TR Vmax:        371.00 cm/s MR Peak grad: 25.0 mmHg MR Vmax:      250.00 cm/s  SHUNTS MV E velocity: 67.90 cm/s  Systemic VTI:  0.18 m MV A velocity: 61.00 cm/s  Systemic Diam: 2.10 cm MV E/A ratio:  1.11 Buford Dresser MD Electronically signed by Buford Dresser MD Signature Date/Time: 06/30/2022/10:26:02 AM    Final    DG Chest Port 1 View  Result Date: 06/29/2022 CLINICAL DATA:  Shortness of breath. EXAM: PORTABLE CHEST 1 VIEW COMPARISON:  08/15/2018 FINDINGS: 0545 hours. Low volume film. The cardio pericardial silhouette is enlarged. Diffuse interstitial and mid/basilar lung airspace disease is progressive in the interval. Probable small bilateral pleural effusions. No evidence for pneumothorax. Telemetry leads overlie the chest. IMPRESSION: Interval progression of diffuse interstitial  and mid/basilar lung airspace disease with probable small bilateral pleural effusions. Imaging appearance suggest pulmonary edema. Electronically Signed   By: Misty Stanley M.D.   On: 06/29/2022 05:53    Microbiology: Recent Results (from the past 240 hour(s))  Resp panel by RT-PCR (RSV, Flu A&B, Covid) Anterior Nasal Swab     Status: None   Collection Time: 06/29/22  4:55 AM   Specimen: Anterior Nasal Swab  Result Value Ref Range Status   SARS Coronavirus 2 by RT PCR NEGATIVE NEGATIVE Final   Influenza A by PCR NEGATIVE NEGATIVE Final   Influenza B by PCR NEGATIVE NEGATIVE Final    Comment: (NOTE) The Xpert Xpress SARS-CoV-2/FLU/RSV plus assay is intended as an aid in the diagnosis of influenza from Nasopharyngeal swab specimens and should not be  used as a sole basis for treatment. Nasal washings and aspirates are unacceptable for Xpert Xpress SARS-CoV-2/FLU/RSV testing.  Fact Sheet for Patients: EntrepreneurPulse.com.au  Fact Sheet for Healthcare Providers: IncredibleEmployment.be  This test is not yet approved or cleared by the [place] FDA and has been authorized for detection and/or diagnosis of SARS-CoV-2 by FDA under an Emergency Use Authorization (EUA). This EUA will remain in effect (meaning this test can be used) for the duration of the COVID-19 declaration under Section 564(b)(1) of the Act, 21 U.S.C. section 360bbb-3(b)(1), unless the authorization is terminated or revoked.     Resp Syncytial Virus by PCR NEGATIVE NEGATIVE Final    Comment: (NOTE) Fact Sheet for Patients: EntrepreneurPulse.com.au  Fact Sheet for Healthcare Providers: IncredibleEmployment.be  This test is not yet approved or cleared by the [place] FDA and has been authorized for detection and/or diagnosis of SARS-CoV-2 by FDA under an Emergency Use Authorization (EUA). This EUA will remain in effect (meaning this  test can be used) for the duration of the COVID-19 declaration under Section 564(b)(1) of the Act, 21 U.S.C. section 360bbb-3(b)(1), unless the authorization is terminated or revoked.  Performed at [facility], [place] [address] [place], [place] [address]      Labs: Basic Metabolic Panel: Recent Labs  Lab 07/01/22 1544 07/02/22 0757 07/03/22 0015  NA 137 136 135  K 3.9 3.4* 4.0  CL 102 99 98  CO2 26 27 26  $ GLUCOSE 358* 233* 240*  BUN 31* 31* 43*  CREATININE 2.53* 2.32* 2.86*  CALCIUM 8.2* 8.3* 8.2*   Liver Function Tests: No results for input(s): "AST", "ALT", "ALKPHOS", "BILITOT", "PROT", "ALBUMIN" in the last 168 hours. No results for input(s): "LIPASE", "AMYLASE" in the last 168 hours. No results for input(s): "AMMONIA" in the last 168 hours. CBC: Recent Labs  Lab 07/03/22 0015  WBC 9.5  HGB 8.3*  HCT 25.4*  MCV 105.4*  PLT 216   Cardiac Enzymes: No results for input(s): "CKTOTAL", "CKMB", "CKMBINDEX", "TROPONINI" in the last 168 hours. BNP: BNP (last 3 results) Recent Labs    06/29/22 0454  BNP 243.4*    ProBNP (last 3 results) No results for input(s): "PROBNP" in the last 8760 hours.  CBG: Recent Labs  Lab 07/03/22 1134 07/03/22 1634 07/03/22 2155 07/04/22 0636 07/04/22 1043  GLUCAP 266* 189* 248* 181* 193*       Signed:  Domenic Polite MD.  [facility] 07/07/2022, 2:19 PM

## 2022-07-08 ENCOUNTER — Other Ambulatory Visit: Payer: Self-pay | Admitting: Cardiology

## 2022-07-08 DIAGNOSIS — I10 Essential (primary) hypertension: Secondary | ICD-10-CM

## 2022-08-07 ENCOUNTER — Other Ambulatory Visit: Payer: Self-pay | Admitting: Cardiology

## 2022-08-07 DIAGNOSIS — I5032 Chronic diastolic (congestive) heart failure: Secondary | ICD-10-CM

## 2022-09-04 ENCOUNTER — Other Ambulatory Visit: Payer: Self-pay | Admitting: Cardiology

## 2022-09-04 DIAGNOSIS — I10 Essential (primary) hypertension: Secondary | ICD-10-CM

## 2022-11-05 ENCOUNTER — Other Ambulatory Visit: Payer: Self-pay | Admitting: Cardiology

## 2022-11-05 DIAGNOSIS — I10 Essential (primary) hypertension: Secondary | ICD-10-CM

## 2022-12-05 ENCOUNTER — Other Ambulatory Visit: Payer: Self-pay | Admitting: Cardiology

## 2022-12-05 DIAGNOSIS — I5032 Chronic diastolic (congestive) heart failure: Secondary | ICD-10-CM

## 2022-12-05 DIAGNOSIS — I10 Essential (primary) hypertension: Secondary | ICD-10-CM

## 2023-02-28 ENCOUNTER — Other Ambulatory Visit: Payer: Self-pay | Admitting: Cardiology

## 2023-02-28 DIAGNOSIS — I10 Essential (primary) hypertension: Secondary | ICD-10-CM

## 2023-03-02 ENCOUNTER — Other Ambulatory Visit: Payer: Self-pay

## 2023-03-02 DIAGNOSIS — I10 Essential (primary) hypertension: Secondary | ICD-10-CM

## 2023-03-02 MED ORDER — ISOSORB DINITRATE-HYDRALAZINE 20-37.5 MG PO TABS
2.0000 | ORAL_TABLET | Freq: Three times a day (TID) | ORAL | 0 refills | Status: DC
Start: 2023-03-02 — End: 2023-03-13

## 2023-03-11 ENCOUNTER — Telehealth: Payer: Self-pay | Admitting: Cardiology

## 2023-03-11 NOTE — Telephone Encounter (Signed)
Called patient to schedule virtual visit.

## 2023-03-11 NOTE — Telephone Encounter (Signed)
This was from my note on him on 12/15.  My initial plan was to proceed with stress testing, echocardiogram, but potential consideration for invasive management in future.  However after talking more with the patient and his wife, it is evident that patient is more concerned about his quality of life and longevity.  He does not want to consider invasive therapy as far as possible.  In fact, I have even encouraged him to discuss at subsequent visits, advance care directive. He prefers future visits and a video visit format, which is okay with me.

## 2023-03-11 NOTE — Telephone Encounter (Signed)
Patient is returning call and is requesting call back.  

## 2023-03-11 NOTE — Telephone Encounter (Signed)
*  STAT* If patient is at the pharmacy, call can be transferred to refill team.   1. Which medications need to be refilled? (please list name of each medication and dose if known)   traZODone (DESYREL) 100 MG tablet    2. Would you like to learn more about the convenience, safety, & potential cost savings by using the [facility]? No    3. Are you open to using the [facility] (Type [facility]. No    4. Which pharmacy/location (including street and city if local pharmacy) is medication to be sent to?  [id number] - [place], [place] - [address]    5. Do they need a 30 day or 90 day supply? 90 day supply  Only has 4 pills left.

## 2023-03-11 NOTE — Telephone Encounter (Signed)
Patient is requesting a callback to setup a virtual appointment with Dr. Rosemary Holms. He states he is having trouble sleeping, but does not want to be a narcotic for it, so he wants the appointment to discuss his options further.   Was unable to arrange follow up for patient due to Dr. Rosemary Holms not having any virtual slots.  Please advise.

## 2023-03-11 NOTE — Telephone Encounter (Signed)
Pt reports can no longer walk and Dr. Rosemary Holms has agreed to see him virtually.   Reports unable to sleep would like him to prescribe Trazodone.   Advised pt MD does not prescribe sleep aids will need to reach out to PCP.  Pt reports would like MD to prescribe.  Pt is also requesting a virtual visit with MD.  David [name] will send message to MD to address.

## 2023-03-11 NOTE — Telephone Encounter (Signed)
Left a message to call back.

## 2023-03-13 ENCOUNTER — Encounter: Payer: Self-pay | Admitting: Cardiology

## 2023-03-13 ENCOUNTER — Ambulatory Visit: Payer: Medicare Other | Attending: Cardiology | Admitting: Cardiology

## 2023-03-13 DIAGNOSIS — I110 Hypertensive heart disease with heart failure: Secondary | ICD-10-CM

## 2023-03-13 DIAGNOSIS — I2510 Atherosclerotic heart disease of native coronary artery without angina pectoris: Secondary | ICD-10-CM | POA: Diagnosis not present

## 2023-03-13 DIAGNOSIS — I5032 Chronic diastolic (congestive) heart failure: Secondary | ICD-10-CM

## 2023-03-13 DIAGNOSIS — I10 Essential (primary) hypertension: Secondary | ICD-10-CM

## 2023-03-13 MED ORDER — ATORVASTATIN CALCIUM 40 MG PO TABS: 40.0000 mg | ORAL_TABLET | Freq: Every day | ORAL | 3 refills | Status: DC

## 2023-03-13 MED ORDER — TRAZODONE HCL 100 MG PO TABS: 100.0000 mg | ORAL_TABLET | Freq: Every day | ORAL | 1 refills | Status: DC

## 2023-03-13 MED ORDER — AMLODIPINE BESYLATE 10 MG PO TABS
10.0000 mg | ORAL_TABLET | Freq: Every day | ORAL | 3 refills | Status: DC
Start: 2023-03-13 — End: 2023-05-25

## 2023-03-13 MED ORDER — ISOSORB DINITRATE-HYDRALAZINE 20-37.5 MG PO TABS
2.0000 | ORAL_TABLET | Freq: Three times a day (TID) | ORAL | 1 refills | Status: DC
Start: 2023-03-13 — End: 2023-05-25

## 2023-03-13 MED ORDER — TORSEMIDE 20 MG PO TABS
20.0000 mg | ORAL_TABLET | Freq: Two times a day (BID) | ORAL | 2 refills | Status: DC
Start: 2023-03-13 — End: 2023-05-05

## 2023-03-13 MED ORDER — METOPROLOL SUCCINATE ER 100 MG PO TB24: 100.0000 mg | ORAL_TABLET | Freq: Every day | ORAL | 3 refills | Status: DC

## 2023-03-13 NOTE — Progress Notes (Addendum)
Cardiology Office Note:  .   Date:  03/13/2023  ID:  David [name], DOB [birth year], MRN [id number] PCP: Renford Dills, MD  David [name] Cardiologist:  Truett Mainland, MD PCP: Renford Dills, MD  Patient location: Residence Provider location: [facility] Time spent: 15 min  Chief Complaint  Patient presents with   Coronary Artery Disease   Follow-up   Given patient's limited functional ability and lack of transportation, patient requested virtual/telephone visit   History of Present Illness: .    David [name] is a 77 y.o. male with  hypertension, type 2 DM with complications including CKD 3-4, diabetic retinopathy and peripheral neuropathy, CAD s/p complex LAD PCI 03/2017, mod carotid stenosis, HFpEF, WHO grp II pulmonary hypertension.  Patient's functional capacity is severely limited due to neuropathy.  Needs assistance with all ADLs, his wife helps with these.  He has not had any recent chest pain or shortness of breath symptoms.  He has not had any PCP evaluation recently.  He tells me categorically that he is not able to visit PCP or lab for any tests.   Vitals:   03/13/23 1259 03/13/23 1303  BP: (!) 162/91 (!) 150/82  Pulse: 76 73     ROS:  Review of Systems  Constitutional: Positive for decreased appetite.     Studies Reviewed: Marland Kitchen       Labs 06/2022: Creatinine 2.86   Physical Exam:   Physical Exam Not performed, telephone visit  VISIT DIAGNOSES:   ICD-10-CM   1. Chronic heart failure with preserved ejection fraction (HFpEF) (HCC)  I50.32 torsemide (DEMADEX) 20 MG tablet    2. Essential hypertension  I10 isosorbide-hydrALAZINE (BIDIL) 20-37.5 MG tablet    3. Coronary artery disease involving native coronary artery of native heart without angina pectoris  I25.10 amLODipine (NORVASC) 10 MG tablet       ASSESSMENT AND PLAN: .    David [name] is a 77 y.o. male with  hypertension, type 2 DM, CKD 3-4, CAD s/p  complex LAD PCI 03/2017, mod carotid stenosis, HFpEF, PAD    CAD: No recent angina symptoms and is limited physical capacity. Continue Aspirin, statin   PAD: S/p toe amputations in both feet. No nonhealing wounds at this time. With his nearly non-existent physical activity, he has no symptoms of claudication. Continue medical management. No plans for invasive management at this time.  At patient's request, I refilled trazodone.  However, have encouraged him to request PCP for future refills.  He currently does not have a PCP and states that he is unable to visit for any appointment.  Have encouraged him to look into seeing a provider through "[facility]".   Conservative management. No further invasive workup.  Meds ordered this encounter  Medications   traZODone (DESYREL) 100 MG tablet    Sig: Take 1 tablet (100 mg total) by mouth at bedtime.    Dispense:  90 tablet    Refill:  1    Further refills should come from the pts PCP.   torsemide (DEMADEX) 20 MG tablet    Sig: Take 1 tablet (20 mg total) by mouth 2 (two) times daily.    Dispense:  180 tablet    Refill:  2   metoprolol succinate (TOPROL-XL) 100 MG 24 hr tablet    Sig: Take 1 tablet (100 mg total) by mouth daily. Take with or immediately following a meal.    Dispense:  90 tablet  Refill:  3   isosorbide-hydrALAZINE (BIDIL) 20-37.5 MG tablet    Sig: Take 2 tablets by mouth 3 (three) times daily.    Dispense:  540 tablet    Refill:  1   atorvastatin (LIPITOR) 40 MG tablet    Sig: Take 1 tablet (40 mg total) by mouth daily.    Dispense:  90 tablet    Refill:  3   amLODipine (NORVASC) 10 MG tablet    Sig: Take 1 tablet (10 mg total) by mouth daily.    Dispense:  90 tablet    Refill:  3     F/u in 1 year  Signed, Elder Negus, MD

## 2023-03-13 NOTE — Patient Instructions (Signed)
Medication Instructions:   Your physician recommends that you continue on your current medications as directed. Please refer to the Current Medication list given to you today.  *If you need a refill on your cardiac medications before your next appointment, please call your pharmacy*    Follow-Up:  As advised by Dr. Rosemary Holms

## 2023-05-04 ENCOUNTER — Emergency Department (HOSPITAL_COMMUNITY): Payer: Medicare Other

## 2023-05-04 ENCOUNTER — Other Ambulatory Visit: Payer: Self-pay

## 2023-05-04 ENCOUNTER — Inpatient Hospital Stay (HOSPITAL_COMMUNITY)
Admission: EM | Admit: 2023-05-04 | Discharge: 2023-05-11 | DRG: 291 | Disposition: A | Discharge status: Skilled Nursing Facility | Payer: Medicare Other | Attending: Internal Medicine | Admitting: Internal Medicine

## 2023-05-04 DIAGNOSIS — D631 Anemia in chronic kidney disease: Secondary | ICD-10-CM | POA: Diagnosis present

## 2023-05-04 DIAGNOSIS — J189 Pneumonia, unspecified organism: Secondary | ICD-10-CM | POA: Diagnosis present

## 2023-05-04 DIAGNOSIS — Z91148 Patient's other noncompliance with medication regimen for other reason: Secondary | ICD-10-CM

## 2023-05-04 DIAGNOSIS — Z8249 Family history of ischemic heart disease and other diseases of the circulatory system: Secondary | ICD-10-CM

## 2023-05-04 DIAGNOSIS — Z89411 Acquired absence of right great toe: Secondary | ICD-10-CM

## 2023-05-04 DIAGNOSIS — E785 Hyperlipidemia, unspecified: Secondary | ICD-10-CM

## 2023-05-04 DIAGNOSIS — J9811 Atelectasis: Secondary | ICD-10-CM | POA: Diagnosis not present

## 2023-05-04 DIAGNOSIS — I509 Heart failure, unspecified: Secondary | ICD-10-CM | POA: Diagnosis not present

## 2023-05-04 DIAGNOSIS — I5031 Acute diastolic (congestive) heart failure: Secondary | ICD-10-CM | POA: Diagnosis not present

## 2023-05-04 DIAGNOSIS — Z87891 Personal history of nicotine dependence: Secondary | ICD-10-CM

## 2023-05-04 DIAGNOSIS — R946 Abnormal results of thyroid function studies: Secondary | ICD-10-CM | POA: Diagnosis not present

## 2023-05-04 DIAGNOSIS — Z6827 Body mass index (BMI) 27.0-27.9, adult: Secondary | ICD-10-CM

## 2023-05-04 DIAGNOSIS — I5033 Acute on chronic diastolic (congestive) heart failure: Secondary | ICD-10-CM | POA: Diagnosis not present

## 2023-05-04 DIAGNOSIS — L089 Local infection of the skin and subcutaneous tissue, unspecified: Secondary | ICD-10-CM | POA: Diagnosis present

## 2023-05-04 DIAGNOSIS — I272 Pulmonary hypertension, unspecified: Secondary | ICD-10-CM | POA: Diagnosis not present

## 2023-05-04 DIAGNOSIS — Z96649 Presence of unspecified artificial hip joint: Secondary | ICD-10-CM | POA: Diagnosis present

## 2023-05-04 DIAGNOSIS — I5021 Acute systolic (congestive) heart failure: Principal | ICD-10-CM

## 2023-05-04 DIAGNOSIS — E1151 Type 2 diabetes mellitus with diabetic peripheral angiopathy without gangrene: Secondary | ICD-10-CM | POA: Diagnosis not present

## 2023-05-04 DIAGNOSIS — R0902 Hypoxemia: Secondary | ICD-10-CM

## 2023-05-04 DIAGNOSIS — E1169 Type 2 diabetes mellitus with other specified complication: Secondary | ICD-10-CM | POA: Diagnosis present

## 2023-05-04 DIAGNOSIS — I2510 Atherosclerotic heart disease of native coronary artery without angina pectoris: Secondary | ICD-10-CM | POA: Diagnosis not present

## 2023-05-04 DIAGNOSIS — Z79899 Other long term (current) drug therapy: Secondary | ICD-10-CM

## 2023-05-04 DIAGNOSIS — E119 Type 2 diabetes mellitus without complications: Secondary | ICD-10-CM | POA: Diagnosis present

## 2023-05-04 DIAGNOSIS — E11319 Type 2 diabetes mellitus with unspecified diabetic retinopathy without macular edema: Secondary | ICD-10-CM | POA: Diagnosis not present

## 2023-05-04 DIAGNOSIS — Z89421 Acquired absence of other right toe(s): Secondary | ICD-10-CM

## 2023-05-04 DIAGNOSIS — Z7982 Long term (current) use of aspirin: Secondary | ICD-10-CM

## 2023-05-04 DIAGNOSIS — E1140 Type 2 diabetes mellitus with diabetic neuropathy, unspecified: Secondary | ICD-10-CM | POA: Diagnosis not present

## 2023-05-04 DIAGNOSIS — D649 Anemia, unspecified: Secondary | ICD-10-CM | POA: Diagnosis not present

## 2023-05-04 DIAGNOSIS — J90 Pleural effusion, not elsewhere classified: Secondary | ICD-10-CM

## 2023-05-04 DIAGNOSIS — R918 Other nonspecific abnormal finding of lung field: Secondary | ICD-10-CM | POA: Diagnosis not present

## 2023-05-04 DIAGNOSIS — J9601 Acute respiratory failure with hypoxia: Secondary | ICD-10-CM | POA: Diagnosis present

## 2023-05-04 DIAGNOSIS — R0602 Shortness of breath: Secondary | ICD-10-CM | POA: Diagnosis not present

## 2023-05-04 DIAGNOSIS — N1832 Chronic kidney disease, stage 3b: Secondary | ICD-10-CM | POA: Diagnosis present

## 2023-05-04 DIAGNOSIS — I739 Peripheral vascular disease, unspecified: Secondary | ICD-10-CM | POA: Diagnosis present

## 2023-05-04 DIAGNOSIS — I130 Hypertensive heart and chronic kidney disease with heart failure and stage 1 through stage 4 chronic kidney disease, or unspecified chronic kidney disease: Principal | ICD-10-CM | POA: Diagnosis present

## 2023-05-04 DIAGNOSIS — Z743 Need for continuous supervision: Secondary | ICD-10-CM | POA: Diagnosis not present

## 2023-05-04 DIAGNOSIS — I5043 Acute on chronic combined systolic (congestive) and diastolic (congestive) heart failure: Secondary | ICD-10-CM | POA: Diagnosis not present

## 2023-05-04 DIAGNOSIS — I10 Essential (primary) hypertension: Secondary | ICD-10-CM | POA: Diagnosis present

## 2023-05-04 DIAGNOSIS — S31000A Unspecified open wound of lower back and pelvis without penetration into retroperitoneum, initial encounter: Secondary | ICD-10-CM | POA: Diagnosis present

## 2023-05-04 DIAGNOSIS — E11628 Type 2 diabetes mellitus with other skin complications: Secondary | ICD-10-CM

## 2023-05-04 DIAGNOSIS — M79672 Pain in left foot: Secondary | ICD-10-CM | POA: Diagnosis present

## 2023-05-04 DIAGNOSIS — E1165 Type 2 diabetes mellitus with hyperglycemia: Secondary | ICD-10-CM | POA: Diagnosis not present

## 2023-05-04 DIAGNOSIS — Z820 Family history of epilepsy and other diseases of the nervous system: Secondary | ICD-10-CM

## 2023-05-04 DIAGNOSIS — E782 Mixed hyperlipidemia: Secondary | ICD-10-CM | POA: Diagnosis not present

## 2023-05-04 DIAGNOSIS — E440 Moderate protein-calorie malnutrition: Secondary | ICD-10-CM | POA: Diagnosis not present

## 2023-05-04 DIAGNOSIS — R531 Weakness: Secondary | ICD-10-CM | POA: Diagnosis not present

## 2023-05-04 DIAGNOSIS — D638 Anemia in other chronic diseases classified elsewhere: Secondary | ICD-10-CM | POA: Diagnosis not present

## 2023-05-04 DIAGNOSIS — I5030 Unspecified diastolic (congestive) heart failure: Secondary | ICD-10-CM | POA: Diagnosis present

## 2023-05-04 DIAGNOSIS — Z8349 Family history of other endocrine, nutritional and metabolic diseases: Secondary | ICD-10-CM

## 2023-05-04 DIAGNOSIS — R197 Diarrhea, unspecified: Secondary | ICD-10-CM | POA: Diagnosis not present

## 2023-05-04 DIAGNOSIS — Z89422 Acquired absence of other left toe(s): Secondary | ICD-10-CM

## 2023-05-04 DIAGNOSIS — I110 Hypertensive heart disease with heart failure: Secondary | ICD-10-CM | POA: Diagnosis not present

## 2023-05-04 DIAGNOSIS — Z833 Family history of diabetes mellitus: Secondary | ICD-10-CM

## 2023-05-04 DIAGNOSIS — T501X6A Underdosing of loop [high-ceiling] diuretics, initial encounter: Secondary | ICD-10-CM | POA: Diagnosis present

## 2023-05-04 DIAGNOSIS — R7989 Other specified abnormal findings of blood chemistry: Secondary | ICD-10-CM | POA: Diagnosis present

## 2023-05-04 DIAGNOSIS — E1122 Type 2 diabetes mellitus with diabetic chronic kidney disease: Secondary | ICD-10-CM | POA: Diagnosis present

## 2023-05-04 DIAGNOSIS — I700 Atherosclerosis of aorta: Secondary | ICD-10-CM | POA: Diagnosis not present

## 2023-05-04 DIAGNOSIS — G4700 Insomnia, unspecified: Secondary | ICD-10-CM | POA: Diagnosis present

## 2023-05-04 DIAGNOSIS — L02612 Cutaneous abscess of left foot: Secondary | ICD-10-CM | POA: Diagnosis present

## 2023-05-04 DIAGNOSIS — I517 Cardiomegaly: Secondary | ICD-10-CM | POA: Diagnosis not present

## 2023-05-04 DIAGNOSIS — Z91128 Patient's intentional underdosing of medication regimen for other reason: Secondary | ICD-10-CM | POA: Diagnosis not present

## 2023-05-04 DIAGNOSIS — Z823 Family history of stroke: Secondary | ICD-10-CM

## 2023-05-04 DIAGNOSIS — Z5320 Procedure and treatment not carried out because of patient's decision for unspecified reasons: Secondary | ICD-10-CM | POA: Diagnosis not present

## 2023-05-04 DIAGNOSIS — Z955 Presence of coronary angioplasty implant and graft: Secondary | ICD-10-CM

## 2023-05-04 LAB — CBC WITH DIFFERENTIAL/PLATELET
Abs Immature Granulocytes: 0.03 10*3/uL (ref 0.00–0.07)
Basophils Absolute: 0 10*3/uL (ref 0.0–0.1)
Basophils Relative: 0 %
Eosinophils Absolute: 0.1 10*3/uL (ref 0.0–0.5)
Eosinophils Relative: 1 %
HCT: 29.4 % — ABNORMAL LOW (ref 39.0–52.0)
Hemoglobin: 9 g/dL — ABNORMAL LOW (ref 13.0–17.0)
Immature Granulocytes: 0 %
Lymphocytes Relative: 8 %
Lymphs Abs: 0.7 10*3/uL (ref 0.7–4.0)
MCH: 33.7 pg (ref 26.0–34.0)
MCHC: 30.6 g/dL (ref 30.0–36.0)
MCV: 110.1 fL — ABNORMAL HIGH (ref 80.0–100.0)
Monocytes Absolute: 0.8 10*3/uL (ref 0.1–1.0)
Monocytes Relative: 9 %
Neutro Abs: 7.6 10*3/uL (ref 1.7–7.7)
Neutrophils Relative %: 82 %
Platelets: 293 10*3/uL (ref 150–400)
RBC: 2.67 MIL/uL — ABNORMAL LOW (ref 4.22–5.81)
RDW: 14.4 % (ref 11.5–15.5)
WBC: 9.3 10*3/uL (ref 4.0–10.5)
nRBC: 0 % (ref 0.0–0.2)

## 2023-05-04 LAB — HEPATIC FUNCTION PANEL
ALT: 34 U/L (ref 0–44)
AST: 30 U/L (ref 15–41)
Albumin: 2.9 g/dL — ABNORMAL LOW (ref 3.5–5.0)
Alkaline Phosphatase: 161 U/L — ABNORMAL HIGH (ref 38–126)
Bilirubin, Direct: 0.1 mg/dL (ref 0.0–0.2)
Indirect Bilirubin: 0.8 mg/dL (ref 0.3–0.9)
Total Bilirubin: 0.9 mg/dL (ref <1.2)
Total Protein: 7.5 g/dL (ref 6.5–8.1)

## 2023-05-04 LAB — RETICULOCYTES
Immature Retic Fract: 20.9 % — ABNORMAL HIGH (ref 2.3–15.9)
RBC.: 2.69 MIL/uL — ABNORMAL LOW (ref 4.22–5.81)
Retic Count, Absolute: 85.5 10*3/uL (ref 19.0–186.0)
Retic Ct Pct: 3.2 % — ABNORMAL HIGH (ref 0.4–3.1)

## 2023-05-04 LAB — I-STAT VENOUS BLOOD GAS, ED
Acid-base deficit: 2 mmol/L (ref 0.0–2.0)
Bicarbonate: 22.8 mmol/L (ref 20.0–28.0)
Calcium, Ion: 1.12 mmol/L — ABNORMAL LOW (ref 1.15–1.40)
HCT: 28 % — ABNORMAL LOW (ref 39.0–52.0)
Hemoglobin: 9.5 g/dL — ABNORMAL LOW (ref 13.0–17.0)
O2 Saturation: 99 %
Potassium: 4.2 mmol/L (ref 3.5–5.1)
Sodium: 139 mmol/L (ref 135–145)
TCO2: 24 mmol/L (ref 22–32)
pCO2, Ven: 38.6 mm[Hg] — ABNORMAL LOW (ref 44–60)
pH, Ven: 7.379 (ref 7.25–7.43)
pO2, Ven: 144 mm[Hg] — ABNORMAL HIGH (ref 32–45)

## 2023-05-04 LAB — TROPONIN I (HIGH SENSITIVITY)
Troponin I (High Sensitivity): 15 ng/L (ref <18)
Troponin I (High Sensitivity): 14 ng/L (ref <18)

## 2023-05-04 LAB — URINALYSIS, ROUTINE W REFLEX MICROSCOPIC
Bilirubin Urine: NEGATIVE
Glucose, UA: NEGATIVE mg/dL
Hgb urine dipstick: NEGATIVE
Ketones, ur: NEGATIVE mg/dL
Nitrite: NEGATIVE
Protein, ur: 100 mg/dL — AB
Specific Gravity, Urine: 1.014 (ref 1.005–1.030)
WBC, UA: >50 WBC/hpf (ref 0–5)
pH: 5 (ref 5.0–8.0)

## 2023-05-04 LAB — BASIC METABOLIC PANEL
Anion gap: 9 (ref 5–15)
BUN: 41 mg/dL — ABNORMAL HIGH (ref 8–23)
CO2: 20 mmol/L — ABNORMAL LOW (ref 22–32)
Calcium: 8.7 mg/dL — ABNORMAL LOW (ref 8.9–10.3)
Chloride: 110 mmol/L (ref 98–111)
Creatinine, Ser: 2.2 mg/dL — ABNORMAL HIGH (ref 0.61–1.24)
GFR, Estimated: 30 mL/min — ABNORMAL LOW (ref >60)
Glucose, Bld: 206 mg/dL — ABNORMAL HIGH (ref 70–99)
Potassium: 4.2 mmol/L (ref 3.5–5.1)
Sodium: 139 mmol/L (ref 135–145)

## 2023-05-04 LAB — FERRITIN: Ferritin: 212 ng/mL (ref 24–336)

## 2023-05-04 LAB — IRON AND TIBC
Iron: 39 ug/dL — ABNORMAL LOW (ref 45–182)
Saturation Ratios: 19 % (ref 17.9–39.5)
TIBC: 203 ug/dL — ABNORMAL LOW (ref 250–450)
UIBC: 164 ug/dL

## 2023-05-04 LAB — I-STAT CG4 LACTIC ACID, ED: Lactic Acid, Venous: 0.9 mmol/L (ref 0.5–1.9)

## 2023-05-04 LAB — TSH: TSH: 4.587 u[IU]/mL — ABNORMAL HIGH (ref 0.350–4.500)

## 2023-05-04 LAB — D-DIMER, QUANTITATIVE: D-Dimer, Quant: 3.69 ug{FEU}/mL — ABNORMAL HIGH (ref 0.00–0.50)

## 2023-05-04 LAB — HEMOGLOBIN A1C
Hgb A1c MFr Bld: 6.4 % — ABNORMAL HIGH (ref 4.8–5.6)
Mean Plasma Glucose: 136.98 mg/dL

## 2023-05-04 LAB — BRAIN NATRIURETIC PEPTIDE: B Natriuretic Peptide: 455.1 pg/mL — ABNORMAL HIGH (ref 0.0–100.0)

## 2023-05-04 LAB — PHOSPHORUS: Phosphorus: 4 mg/dL (ref 2.5–4.6)

## 2023-05-04 LAB — CK: Total CK: 106 U/L (ref 49–397)

## 2023-05-04 LAB — MAGNESIUM: Magnesium: 2.7 mg/dL — ABNORMAL HIGH (ref 1.7–2.4)

## 2023-05-04 MED ORDER — INSULIN ASPART 100 UNIT/ML IJ SOLN: 0.0000 [IU] | Freq: Every day | INTRAMUSCULAR | Status: DC

## 2023-05-04 MED ORDER — SODIUM CHLORIDE 0.9 % IV SOLN
1.0000 g | INTRAVENOUS | Status: DC
  Administered 2023-05-05: 1 g via INTRAVENOUS
  Filled 2023-05-04: qty 10

## 2023-05-04 MED ORDER — FUROSEMIDE 10 MG/ML IJ SOLN
60.0000 mg | Freq: Once | INTRAMUSCULAR | Status: AC
Start: 2023-05-04 — End: 2023-05-04
  Administered 2023-05-04: 60 mg via INTRAVENOUS
  Filled 2023-05-04: qty 6

## 2023-05-04 MED ORDER — INSULIN ASPART 100 UNIT/ML IJ SOLN: 0.0000 [IU] | Freq: Three times a day (TID) | INTRAMUSCULAR | Status: DC

## 2023-05-04 MED ORDER — NITROGLYCERIN 2 % TD OINT
1.0000 [in_us] | TOPICAL_OINTMENT | Freq: Once | TRANSDERMAL | Status: AC
  Administered 2023-05-04: 1 [in_us] via TOPICAL
  Filled 2023-05-04: qty 1

## 2023-05-04 NOTE — ED Provider Notes (Addendum)
[place] EMERGENCY DEPARTMENT AT [facility], [place] Provider Note   CSN: [id number] Arrival date & time: 05/04/23  1736     History  Chief Complaint  Patient presents with   Shortness of Breath    David [name] is a 77 y.o. male.  Patient presents with worsening shortness of breath over the past few weeks.  Patient not taking medications regularly and has had worsening symptoms in general weakness over this time.  Patient has history of heart failure.  Patient is not on oxygen at home.  Patient has exertional shortness of breath worsening unsure amount of weight gain.  Chronic leg edema.  Patient denies blood clot history and no recent surgeries.  The history is provided by the patient.  Shortness of Breath Severity:  Moderate Associated symptoms: cough (minimal)   Associated symptoms: no abdominal pain, no chest pain, no fever, no headaches, no neck pain, no rash and no vomiting        Home Medications Prior to Admission medications   Medication Sig Start Date End Date Taking? Authorizing Provider  albuterol (PROVENTIL HFA;VENTOLIN HFA) 108 (90 Base) MCG/ACT inhaler Inhale 2 puffs into the lungs every 6 (six) hours as needed for wheezing or shortness of breath. 04/09/17   Vassie Loll, MD  amLODipine (NORVASC) 10 MG tablet Take 1 tablet (10 mg total) by mouth daily. 03/13/23   Patwardhan, Anabel Bene, MD  aspirin EC 81 MG tablet Take 81 mg by mouth daily.    [provider]  atorvastatin (LIPITOR) 40 MG tablet Take 1 tablet (40 mg total) by mouth daily. 03/13/23   Patwardhan, Anabel Bene, MD  dicyclomine (BENTYL) 10 MG capsule Take 1 capsule (10 mg total) by mouth 4 (four) times daily -  before meals and at bedtime. 08/15/18   Marguerita Merles Latif, DO  docusate sodium (COLACE) 100 MG capsule Take 100 mg by mouth daily.    [provider]  dorzolamide-timolol (COSOPT) 22.3-6.8 MG/ML ophthalmic solution Place 1 drop into both eyes 2 (two) times daily. 11/17/17    [provider]  ferrous sulfate 325 (65 FE) MG tablet Take 325 mg by mouth daily with breakfast.    [provider]  guaiFENesin (MUCINEX PO) Take 1,200 mg by mouth daily.    [provider]  HUMALOG MIX 75/25 KWIKPEN (75-25) 100 UNIT/ML Kwikpen Inject 36 Units into the skin in the morning and at bedtime. 07/31/20   [provider]  HYDROcodone-acetaminophen (NORCO) 10-325 MG tablet Take 1 tablet by mouth daily. Patient not taking: Reported on 03/13/2023    [provider]  isosorbide-hydrALAZINE (BIDIL) 20-37.5 MG tablet Take 2 tablets by mouth 3 (three) times daily. 03/13/23   Patwardhan, Anabel Bene, MD  metoprolol succinate (TOPROL-XL) 100 MG 24 hr tablet Take 1 tablet (100 mg total) by mouth daily. Take with or immediately following a meal. 03/13/23   Patwardhan, Manish J, MD  Multiple Vitamin (MULTIVITAMIN WITH MINERALS) TABS tablet Take 1 tablet by mouth daily. 08/16/18   Sheikh, Kateri Mc Latif, DO  nitroGLYCERIN (NITROSTAT) 0.4 MG SL tablet Place 1 tablet (0.4 mg total) under the tongue every 5 (five) minutes as needed for chest pain. 05/02/22   Patwardhan, Anabel Bene, MD  torsemide (DEMADEX) 20 MG tablet Take 1 tablet (20 mg total) by mouth 2 (two) times daily. 03/13/23   Patwardhan, Anabel Bene, MD  traZODone (DESYREL) 100 MG tablet Take 1 tablet (100 mg total) by mouth at bedtime. 03/13/23   Patwardhan, Manish J,  MD      Allergies    Patient has no known allergies.    Review of Systems   Review of Systems  Constitutional:  Positive for fatigue. Negative for chills and fever.  HENT:  Negative for congestion.   Eyes:  Negative for visual disturbance.  Respiratory:  Positive for cough (minimal) and shortness of breath (worse at night, orthopnea).   Cardiovascular:  Negative for chest pain.  Gastrointestinal:  Negative for abdominal pain and vomiting.  Genitourinary:  Negative for dysuria and flank pain.  Musculoskeletal:  Negative for back pain, neck  pain and neck stiffness.  Skin:  Negative for rash.  Neurological:  Negative for light-headedness and headaches.    Physical Exam Updated Vital Signs BP (!) 171/89   Pulse 71   Temp 98.3 F (36.8 C) (Oral)   Resp 18   SpO2 99%  Physical Exam Vitals and nursing note reviewed.  Constitutional:      General: He is not in acute distress.    Appearance: He is well-developed.  HENT:     Head: Normocephalic and atraumatic.     Mouth/Throat:     Mouth: Mucous membranes are moist.  Eyes:     General:        Right eye: No discharge.        Left eye: No discharge.     Conjunctiva/sclera: Conjunctivae normal.  Neck:     Trachea: No tracheal deviation.  Cardiovascular:     Rate and Rhythm: Normal rate and regular rhythm.     Heart sounds: No murmur heard. Pulmonary:     Effort: Pulmonary effort is normal.     Breath sounds: Decreased breath sounds and rales present.  Abdominal:     General: There is no distension.     Palpations: Abdomen is soft.     Tenderness: There is no abdominal tenderness. There is no guarding.  Musculoskeletal:        General: Normal range of motion.     Cervical back: Normal range of motion and neck supple. No rigidity.     Right lower leg: Edema present.     Left lower leg: Edema present.  Skin:    General: Skin is warm.     Capillary Refill: Capillary refill takes less than 2 seconds.     Findings: Rash (chronic thickened skin/ dry and scaly bilateral LE) present.     Comments: Patient has chronic skin changes bilateral lower extremities extending to the feet.  Patient does have soft tender area calcaneus region left foot no erythema.  Neurological:     General: No focal deficit present.     Mental Status: He is alert.     Cranial Nerves: No cranial nerve deficit.  Psychiatric:        Mood and Affect: Mood normal.     ED Results / Procedures / Treatments   Labs (all labs ordered are listed, but only abnormal results are displayed) Labs  Reviewed  BASIC METABOLIC PANEL - Abnormal; Notable for the following components:      Result Value   CO2 20 (*)    Glucose, Bld 206 (*)    BUN 41 (*)    Creatinine, Ser 2.20 (*)    Calcium 8.7 (*)    GFR, Estimated 30 (*)    All other components within normal limits  CBC WITH DIFFERENTIAL/PLATELET - Abnormal; Notable for the following components:   RBC 2.67 (*)    Hemoglobin 9.0 (*)  HCT 29.4 (*)    MCV 110.1 (*)    All other components within normal limits  HEPATIC FUNCTION PANEL - Abnormal; Notable for the following components:   Albumin 2.9 (*)    Alkaline Phosphatase 161 (*)    All other components within normal limits  URINALYSIS, ROUTINE W REFLEX MICROSCOPIC - Abnormal; Notable for the following components:   Color, Urine AMBER (*)    APPearance CLOUDY (*)    Protein, ur 100 (*)    Leukocytes,Ua LARGE (*)    Bacteria, UA MANY (*)    All other components within normal limits  BRAIN NATRIURETIC PEPTIDE - Abnormal; Notable for the following components:   B Natriuretic Peptide 455.1 (*)    All other components within normal limits  D-DIMER, QUANTITATIVE - Abnormal; Notable for the following components:   D-Dimer, Quant 3.69 (*)    All other components within normal limits  IRON AND TIBC - Abnormal; Notable for the following components:   Iron 39 (*)    TIBC 203 (*)    All other components within normal limits  MAGNESIUM - Abnormal; Notable for the following components:   Magnesium 2.7 (*)    All other components within normal limits  TSH - Abnormal; Notable for the following components:   TSH 4.587 (*)    All other components within normal limits  HEMOGLOBIN A1C - Abnormal; Notable for the following components:   Hgb A1c MFr Bld 6.4 (*)    All other components within normal limits  RETICULOCYTES - Abnormal; Notable for the following components:   Retic Ct Pct 3.2 (*)    RBC. 2.69 (*)    Immature Retic Fract 20.9 (*)    All other components within normal limits   I-STAT VENOUS BLOOD GAS, ED - Abnormal; Notable for the following components:   pCO2, Ven 38.6 (*)    pO2, Ven 144 (*)    Calcium, Ion 1.12 (*)    HCT 28.0 (*)    Hemoglobin 9.5 (*)    All other components within normal limits  URINE CULTURE  CK  FERRITIN  PHOSPHORUS  BLOOD GAS, VENOUS  PREALBUMIN  VITAMIN B12  FOLATE  PROCALCITONIN  I-STAT CG4 LACTIC ACID, ED  TROPONIN I (HIGH SENSITIVITY)  TROPONIN I (HIGH SENSITIVITY)    EKG None  Radiology DG Chest 2 View Result Date: 05/04/2023 CLINICAL DATA:  Shortness of breath EXAM: CHEST - 2 VIEW COMPARISON:  06/29/2022 FINDINGS: Stable cardiomegaly. Aortic atherosclerotic calcification. Moderate layering right pleural effusion and associated airspace opacities. The left lung is clear. No pneumothorax. No displaced rib fractures. IMPRESSION: Moderate right pleural effusion and associated atelectasis or pneumonia. Electronically Signed   By: Minerva Fester M.D.   On: 05/04/2023 19:50    Procedures Procedures    Medications Ordered in ED Medications  cefTRIAXone (ROCEPHIN) 1 g in sodium chloride 0.9 % 100 mL IVPB (has no administration in time range)  insulin aspart (novoLOG) injection 0-5 Units (has no administration in time range)  insulin aspart (novoLOG) injection 0-9 Units (has no administration in time range)  furosemide (LASIX) injection 60 mg (60 mg Intravenous Given 05/04/23 2205)  nitroGLYCERIN (NITROGLYN) 2 % ointment 1 inch (1 inch Topical Given 05/04/23 2208)    ED Course/ Medical Decision Making/ A&P                                 Medical Decision Making Amount and/or  Complexity of Data Reviewed Labs: ordered. Radiology: ordered. ECG/medicine tests: ordered.  Risk Prescription drug management. Decision regarding hospitalization.   Patient with known heart failure history and recently noncompliant medications presents with clinical concern for acute heart failure exacerbation given decreased breath  sounds on the right and crackles in medical history.  Patient has chronic leg edema unsure worsening or not.  Patient has minimal cough, no fever, unlikely pneumonia we will hold on antibiotics at this time.  Patient requiring 1 L nasal cannula.  Blood work ordered independently reviewed showing hemoglobin 9 overall similar to previous, potassium 4.2, creatinine 2.2 recently 2.8, BNP 455 and previous to 43.  Last echo reviewed 50% ejection fraction.  Lasix IV ordered.  Paged hospitalist for admission.  Discussed with hospitalist for admission, patient agrees with plan.  Discussed with hospitalist at bedside, patient does have chronic skin changes however area left calcaneus does appear on exam to have fluid beneath.  Plan for x-ray and further workup inpatient likely MRI.      Final Clinical Impression(s) / ED Diagnoses Final diagnoses:  Acute systolic congestive heart failure (HCC)  Hypoxia  Pleural effusion, right  Anemia, chronic disease    Rx / DC Orders ED Discharge Orders     None         Blane Ohara, MD 05/04/23 2225    Blane Ohara, MD 05/05/23 0006

## 2023-05-04 NOTE — ED Notes (Signed)
CCM contacted and pt being monitored.

## 2023-05-04 NOTE — ED Notes (Signed)
Patient requested for RN to see if his O2 is on, RN confirmed that it is and is still at 2L.

## 2023-05-04 NOTE — ED Triage Notes (Signed)
Patient BIB [facility] from home for [facility] and diarrhea starting three weeks ago. Patient has hx of CHF, EMS reports diminished lower lobes, 95% RA but shortness of breath with exertion. A&Ox4, progressive weakness x 3 weeks. Strong odor of urine

## 2023-05-04 NOTE — H&P (Signed)
David [name] QMV:[id number] DOB: [birth year] DOA: 05/04/2023     PCP: Oneita Hurt, No   Outpatient Specialists:   CARDS:   Dr.   Elder Negus, MD    Patient arrived to ER on 05/04/23 at 1736 Referred by Attending Blane Ohara, MD   Patient coming from:    home Lives  With family    Chief Complaint:   Chief Complaint  Patient presents with   Shortness of Breath    HPI: David [name] is a 77 y.o. male with medical history significant of CAD, diastolic CHF, pulmonary hypertension, and CKD      Presented with   Shortness of breath Patient presents from home with shortness of breath and diarrhea for the past 3 weeks has known history of CHF 95% on room air but having increased shortness of breath so started on 2 L Reports for the past 3 weeks has been progressively feeling weak Foul-smelling urine Not on oxygen baseline May have gained some weight he has chronic leg edema reports mild cough no chest pain no fever  He has been off his meds was unsure what     Denies significant ETOH intake   Does not smoke   Lab Results  Component Value Date   SARSCOV2NAA NEGATIVE 06/29/2022   SARSCOV2NAA NEGATIVE 08/03/2020   SARSCOV2NAA NOT DETECTED 08/05/2018        Regarding pertinent Chronic problems:     Hyperlipidemia -  on statins Lipitor (atorvastatin)  Lipid Panel     Component Value Date/Time   TRIG 175 (H) 08/07/2018 1330     HTN on Bidil, metoprolol. norvasc   chronic CHF diastolic - last echo  Recent Results (from the past 28413 hours)  ECHOCARDIOGRAM COMPLETE   Collection Time: 06/30/22  8:52 AM  Result Value   Weight 3,703.73   Height 73   BP 121/108   Single Plane A2C EF 47.6   Single Plane A4C EF 54.5   Calc EF 52.3   S' Lateral 3.70   AR max vel 2.23   AV Area VTI 2.18   AV Mean grad 4.0   AV Peak grad 7.0   Ao pk vel 1.33   Area-P 1/2 4.06   MV M vel 2.50   AV Area mean vel 2.07   MV Peak grad 25.0   Est EF 50 - 55%   Narrative       ECHOCARDIOGRAM REPORT       1. Left ventricular ejection fraction, by estimation, is 50 to 55%. The left ventricle has low normal function. The left ventricle has no regional wall motion abnormalities. There is mild concentric left ventricular hypertrophy. Left ventricular  diastolic parameters are indeterminate.  2. Right ventricular systolic function is normal. The right ventricular size is mildly enlarged. There is severely elevated pulmonary artery systolic pressure. The estimated right ventricular systolic pressure is 70.1 mmHg.  3. Left atrial size was mildly dilated.  4. The mitral valve is normal in structure. Trivial mitral valve regurgitation. No evidence of mitral stenosis.  5. The aortic valve is tricuspid. There is mild calcification of the aortic valve. There is mild thickening of the aortic valve. Aortic valve regurgitation is not visualized. Aortic valve sclerosis/calcification is present, without any evidence of  aortic stenosis.  6. The inferior vena cava is dilated in size with <50% respiratory variability, suggesting right atrial pressure of 15 mmHg.           CAD  -  On Aspirin, statin, betablocker,                  -  followed by cardiology                        DM 2 -  Lab Results  Component Value Date   HGBA1C 8.6 (H) 06/29/2022   on insulin,      CKD stage IIIb   baseline Cr 2.3 CrCl cannot be calculated (Unknown ideal weight.).  Lab Results  Component Value Date   CREATININE 2.20 (H) 05/04/2023   CREATININE 2.86 (H) 07/03/2022   CREATININE 2.32 (H) 07/02/2022   Lab Results  Component Value Date   NA 139 05/04/2023   CL 110 05/04/2023   K 4.2 05/04/2023   CO2 20 (L) 05/04/2023   BUN 41 (H) 05/04/2023   CREATININE 2.20 (H) 05/04/2023   GFRNONAA 30 (L) 05/04/2023   CALCIUM 8.7 (L) 05/04/2023   PHOS 3.6 08/15/2018   ALBUMIN 2.9 (L) 05/04/2023   GLUCOSE 206 (H) 05/04/2023    Hepatic Function Panel     Component Value Date/Time   PROT  7.5 05/04/2023 2045   PROT 6.8 09/10/2018 1526   ALBUMIN 2.9 (L) 05/04/2023 2045   ALBUMIN 3.7 09/10/2018 1526   AST 30 05/04/2023 2045   ALT 34 05/04/2023 2045   ALKPHOS 161 (H) 05/04/2023 2045   BILITOT 0.9 05/04/2023 2045   BILITOT 0.6 09/10/2018 1526   BILIDIR 0.1 05/04/2023 2045   IBILI 0.8 05/04/2023 2045   No results found for requested labs within last 1095 days.     Chronic anemia - baseline hg Hemoglobin & Hematocrit  Recent Labs    06/29/22 0454 07/03/22 0015 05/04/23 2045  HGB 10.8* 8.3* 9.0*   Iron/TIBC/Ferritin/ %Sat    Component Value Date/Time   IRON 46 12/07/2017 0428   TIBC 171 (L) 12/07/2017 0428   FERRITIN 180 12/07/2017 0428   IRONPCTSAT 27 12/07/2017 0428    PAD - on aspirin and Statin   While in ER:    CXR showed pleural effusion on the right      Lab Orders         Urine Culture         Basic metabolic panel         CBC with Differential         Hepatic function panel         Urinalysis, Routine w reflex microscopic -Urine, Clean Catch         Brain natriuretic peptide       CXR - Moderate right pleural effusion and associated atelectasis or pneumonia.    Following Medications were ordered in ER: Medications  nitroGLYCERIN (NITROGLYN) 2 % ointment 1 inch (has no administration in time range)  furosemide (LASIX) injection 60 mg (60 mg Intravenous Given 05/04/23 2205)       ED Triage Vitals  Encounter Vitals Group     BP 05/04/23 1739 (!) 142/81     Systolic BP Percentile --      Diastolic BP Percentile --      Pulse Rate 05/04/23 1739 61     Resp 05/04/23 1739 16     Temp 05/04/23 1740 98.3 F (36.8 C)     Temp Source 05/04/23 1740 Oral     SpO2 05/04/23 1739 98 %     Weight --      Height --  Head Circumference --      Peak Flow --      Pain Score --      Pain Loc --      Pain Education --      Exclude from Growth Chart --   XWRU(04)@     _________________________________________ Significant initial   Findings: Abnormal Labs Reviewed  BASIC METABOLIC PANEL - Abnormal; Notable for the following components:      Result Value   CO2 20 (*)    Glucose, Bld 206 (*)    BUN 41 (*)    Creatinine, Ser 2.20 (*)    Calcium 8.7 (*)    GFR, Estimated 30 (*)    All other components within normal limits  CBC WITH DIFFERENTIAL/PLATELET - Abnormal; Notable for the following components:   RBC 2.67 (*)    Hemoglobin 9.0 (*)    HCT 29.4 (*)    MCV 110.1 (*)    All other components within normal limits  HEPATIC FUNCTION PANEL - Abnormal; Notable for the following components:   Albumin 2.9 (*)    Alkaline Phosphatase 161 (*)    All other components within normal limits  URINALYSIS, ROUTINE W REFLEX MICROSCOPIC - Abnormal; Notable for the following components:   Color, Urine AMBER (*)    APPearance CLOUDY (*)    Protein, ur 100 (*)    Leukocytes,Ua LARGE (*)    Bacteria, UA MANY (*)    All other components within normal limits  BRAIN NATRIURETIC PEPTIDE - Abnormal; Notable for the following components:   B Natriuretic Peptide 455.1 (*)    All other components within normal limits      _________________________ Troponin   Cardiac Panel (last 3 results) Recent Labs    05/04/23 2045  TROPONINIHS 15     ECG: Ordered Personally reviewed and interpreted by me showing: HR : 59 Rhythm:Sinus bradycardia with 1st degree A-V block Nonspecific T wave abnormality Abnormal ECG QTC 459  BNP (last 3 results) Recent Labs    06/29/22 0454 05/04/23 2047  BNP 243.4* 455.1*     COVID-19 Labs  No results for input(s): "DDIMER", "FERRITIN", "LDH", "CRP" in the last 72 hours.  Lab Results  Component Value Date   SARSCOV2NAA NEGATIVE 06/29/2022   SARSCOV2NAA NEGATIVE 08/03/2020   SARSCOV2NAA NOT DETECTED 08/05/2018     ____________________ This patient meets SIRS Criteria and may be septic.    The recent clinical data is shown below. Vitals:   05/04/23 1912 05/04/23 2020 05/04/23 2145  05/04/23 2200  BP:  (!) 163/73 (!) 166/81 (!) 162/96  Pulse:  64 66 66  Resp:  (!) 24 17 15   Temp:      TempSrc:      SpO2: 98% 96% 98% 99%    WBC     Component Value Date/Time   WBC 9.3 05/04/2023 2045   LYMPHSABS 0.7 05/04/2023 2045   LYMPHSABS 1.4 07/20/2018 1456   MONOABS 0.8 05/04/2023 2045   EOSABS 0.1 05/04/2023 2045   EOSABS 0.1 07/20/2018 1456   BASOSABS 0.0 05/04/2023 2045   BASOSABS 0.0 07/20/2018 1456    Procalcitonin   Ordered      UA   evidence of UTI      Urine analysis:    Component Value Date/Time   COLORURINE AMBER (A) 05/04/2023 2008   APPEARANCEUR CLOUDY (A) 05/04/2023 2008   LABSPEC 1.014 05/04/2023 2008   PHURINE 5.0 05/04/2023 2008   GLUCOSEU NEGATIVE 05/04/2023 2008   HGBUR NEGATIVE  05/04/2023 2008   BILIRUBINUR NEGATIVE 05/04/2023 2008   KETONESUR NEGATIVE 05/04/2023 2008   PROTEINUR 100 (A) 05/04/2023 2008   UROBILINOGEN 1.0 02/13/2015 1934   NITRITE NEGATIVE 05/04/2023 2008   LEUKOCYTESUR LARGE (A) 05/04/2023 2008    Results for orders placed or performed during the hospital encounter of 06/29/22  Resp panel by RT-PCR (RSV, Flu A&B, Covid) Anterior Nasal Swab     Status: None   Collection Time: 06/29/22  4:55 AM   Specimen: Anterior Nasal Swab  Result Value Ref Range Status   SARS Coronavirus 2 by RT PCR NEGATIVE NEGATIVE Final   Influenza A by PCR NEGATIVE NEGATIVE Final   Influenza B by PCR NEGATIVE NEGATIVE Final         Resp Syncytial Virus by PCR NEGATIVE NEGATIVE Final         ___________________________________________________ VBG pendng  ABG    Component Value Date/Time   PHART 7.358 08/07/2018 1405   PCO2ART 33.2 08/07/2018 1405   PO2ART 119.0 (H) 08/07/2018 1405   HCO3 22.8 05/04/2023 2317   TCO2 24 05/04/2023 2317   ACIDBASEDEF 2.0 05/04/2023 2317   O2SAT 99 05/04/2023 2317    __________________________________________________________ Recent Labs  Lab 05/04/23 2045  NA 139  K 4.2  CO2 20*  GLUCOSE 206*   BUN 41*  CREATININE 2.20*  CALCIUM 8.7*    Cr    stable,    Lab Results  Component Value Date   CREATININE 2.20 (H) 05/04/2023   CREATININE 2.86 (H) 07/03/2022   CREATININE 2.32 (H) 07/02/2022    Recent Labs  Lab 05/04/23 2045  AST 30  ALT 34  ALKPHOS 161*  BILITOT 0.9  PROT 7.5  ALBUMIN 2.9*   Lab Results  Component Value Date   CALCIUM 8.7 (L) 05/04/2023   PHOS 3.6 08/15/2018          Plt: Lab Results  Component Value Date   PLT 293 05/04/2023       Recent Labs  Lab 05/04/23 2045  WBC 9.3  NEUTROABS 7.6  HGB 9.0*  HCT 29.4*  MCV 110.1*  PLT 293    HG/HCT  stable,     Component Value Date/Time   HGB 9.0 (L) 05/04/2023 2045   HGB 8.0 (L) 09/10/2018 1527   HCT 29.4 (L) 05/04/2023 2045   HCT 23.6 (L) 09/10/2018 1527   MCV 110.1 (H) 05/04/2023 2045   MCV 108 (H) 09/10/2018 1527    _______________________________________________ Hospitalist was called for admission for   Acute systolic congestive heart failure (  Pleural effusion, right    Anemia, chronic disease     The following Work up has been ordered so far:  Orders Placed This Encounter  Procedures   Urine Culture   DG Chest 2 View   Basic metabolic panel   CBC with Differential   Hepatic function panel   Urinalysis, Routine w reflex microscopic -Urine, Clean Catch   Brain natriuretic peptide   ED Cardiac monitoring   Utilize spacer/aerochamber with mdi inhaler for COVID-19 positive patients or PUI for COVID-19   If O2 Sat <94% administer O2 at 2 liters/minute via nasal cannula   Consult to hospitalist   ED EKG   EKG 12-Lead   Insert peripheral IV     OTHER Significant initial  Findings:  labs showing:     DM  labs:  HbA1C: Recent Labs    06/29/22 1130  HGBA1C 8.6*       CBG (last 3)  No results for input(s): "GLUCAP" in the last 72 hours.        Cultures:    Component Value Date/Time   SDES URINE, RANDOM 08/15/2018 0911   SPECREQUEST  08/15/2018 0911     NONE Performed at [facility], [address] [place], [place] [address]    CULT MULTIPLE SPECIES PRESENT, SUGGEST RECOLLECTION (A) 08/15/2018 0911   REPTSTATUS 08/16/2018 FINAL 08/15/2018 0911     Radiological Exams on Admission: DG Chest 2 View Result Date: 05/04/2023 CLINICAL DATA:  Shortness of breath EXAM: CHEST - 2 VIEW COMPARISON:  06/29/2022 FINDINGS: Stable cardiomegaly. Aortic atherosclerotic calcification. Moderate layering right pleural effusion and associated airspace opacities. The left lung is clear. No pneumothorax. No displaced rib fractures. IMPRESSION: Moderate right pleural effusion and associated atelectasis or pneumonia. Electronically Signed   By: Minerva Fester M.D.   On: 05/04/2023 19:50   _______________________________________________________________________________________________________ Latest  Blood pressure (!) 162/96, pulse 66, temperature 98.3 F (36.8 C), temperature source Oral, resp. rate 15, SpO2 99%.   Vitals  labs and radiology finding personally reviewed  Review of Systems:    Pertinent positives include:  shortness of breath at rest.  Bilateral lower extremity swelling  Constitutional:  No weight loss, night sweats, Fevers, chills, fatigue, weight loss  HEENT:  No headaches, Difficulty swallowing,Tooth/dental problems,Sore throat,  No sneezing, itching, ear ache, nasal congestion, post nasal drip,  Cardio-vascular:  No chest pain, Orthopnea, PND, anasarca, dizziness, palpitations.no GI:  No heartburn, indigestion, abdominal pain, nausea, vomiting, diarrhea, change in bowel habits, loss of appetite, melena, blood in stool, hematemesis Resp:  no  No dyspnea on exertion, No excess mucus, no productive cough, No non-productive cough, No coughing up of blood.No change in color of mucus.No wheezing. Skin:  no rash or lesions. No jaundice GU:  no dysuria, change in color of urine, no urgency or frequency. No straining to urinate.  No  flank pain.  Musculoskeletal:  No joint pain or no joint swelling. No decreased range of motion. No back pain.  Psych:  No change in mood or affect. No depression or anxiety. No memory loss.  Neuro: no localizing neurological complaints, no tingling, no weakness, no double vision, no gait abnormality, no slurred speech, no confusion  All systems reviewed and apart from HOPI all are negative _______________________________________________________________________________________________ Past Medical History:   Past Medical History:  Diagnosis Date   Arthritis    CAD in native artery    s/p stent in 11/18   CHF (congestive heart failure) (HCC)    normal echo in 11/18   CKD (chronic kidney disease)    DDD (degenerative disc disease), cervical    Diabetes mellitus with complication (HCC)    Type II   Diabetic neuropathy (HCC)    Diabetic retinopathy (HCC)    GERD (gastroesophageal reflux disease)    Glaucoma    Hypertension       Past Surgical History:  Procedure Laterality Date   AMPUTATION Right 07/09/2016   Procedure: Right Great Toe Amputation at Metatarsophalangeal Joint;  Surgeon: Nadara Mustard, MD;  Location: [facility] OR;  Service: Orthopedics;  Laterality: Right;   AMPUTATION Right 05/28/2018   Procedure: RIGHT SECOND TOE AMPUTATION;  Surgeon: Nadara Mustard, MD;  Location: [facility] OR;  Service: Orthopedics;  Laterality: Right;   AMPUTATION Left 08/03/2020   Procedure: LEFT 5TH RAY AMPUTATION;  Surgeon: Nadara Mustard, MD;  Location: [facility] OR;  Service: Orthopedics;  Laterality: Left;   BACK SURGERY  4   CARDIAC CATHETERIZATION     CORONARY STENT INTERVENTION N/A 04/06/2017   Procedure: CORONARY STENT INTERVENTION;  Surgeon: Elder Negus, MD;  Location: [facility] LAB;  Service: Cardiovascular;  Laterality: N/A;   CORONARY STENT INTERVENTION N/A 04/07/2017   Procedure: CORONARY STENT INTERVENTION;  Surgeon: Elder Negus, MD;  Location: [facility];   Service: Cardiovascular;  Laterality: N/A;   CYST EXCISION     on Back   ENDOTRACHEAL INTUBATION EMERGENT  08/07/2018       JOINT REPLACEMENT     LAPAROSCOPIC ABDOMINAL EXPLORATION N/A 11/26/2017   Procedure: LAPAROSCOPIC ABDOMINAL EXPLORATION, DRAINAGE OF APPENDICEAL ABCESS. PLACEMENT OF DRAIN;  Surgeon: Ovidio Kin, MD;  Location: [facility] OR;  Service: General;  Laterality: N/A;   RIGHT/LEFT HEART CATH AND CORONARY ANGIOGRAPHY N/A 04/06/2017   Procedure: RIGHT/LEFT HEART CATH AND CORONARY ANGIOGRAPHY;  Surgeon: Elder Negus, MD;  Location: [facility];  Service: Cardiovascular;  Laterality: N/A;   TOE SURGERY  05/2018   Left    TOTAL HIP ARTHROPLASTY     Right     Social History:  Ambulatory   cane, walker       reports that he quit smoking about 28 years ago. His smoking use included cigarettes. He started smoking about 33 years ago. He has a 1.3 pack-year smoking history. He has never used smokeless tobacco. He reports current alcohol use. He reports that he does not use drugs.    Family History:   Family History  Problem Relation Age of Onset   Diabetes Other    Hyperlipidemia Other    Hypertension Other    Stroke Other    Alzheimer's disease Other    Thyroid disease Mother    Diabetes Mellitus II Father    Alzheimer's disease Father    ______________________________________________________________________________________________ Allergies: No Known Allergies   Prior to Admission medications   Medication Sig Start Date End Date Taking? Authorizing Provider  albuterol (PROVENTIL HFA;VENTOLIN HFA) 108 (90 Base) MCG/ACT inhaler Inhale 2 puffs into the lungs every 6 (six) hours as needed for wheezing or shortness of breath. 04/09/17   Vassie Loll, MD  amLODipine (NORVASC) 10 MG tablet Take 1 tablet (10 mg total) by mouth daily. 03/13/23   Patwardhan, Anabel Bene, MD  aspirin EC 81 MG tablet Take 81 mg by mouth daily.    [provider]  atorvastatin  (LIPITOR) 40 MG tablet Take 1 tablet (40 mg total) by mouth daily. 03/13/23   Patwardhan, Anabel Bene, MD  dicyclomine (BENTYL) 10 MG capsule Take 1 capsule (10 mg total) by mouth 4 (four) times daily -  before meals and at bedtime. 08/15/18   Marguerita Merles Latif, DO  docusate sodium (COLACE) 100 MG capsule Take 100 mg by mouth daily.    [provider]  dorzolamide-timolol (COSOPT) 22.3-6.8 MG/ML ophthalmic solution Place 1 drop into both eyes 2 (two) times daily. 11/17/17   [provider]  ferrous sulfate 325 (65 FE) MG tablet Take 325 mg by mouth daily with breakfast.    [provider]  guaiFENesin (MUCINEX PO) Take 1,200 mg by mouth daily.    [provider]  HUMALOG MIX 75/25 KWIKPEN (75-25) 100 UNIT/ML Kwikpen Inject 36 Units into the skin in the morning and at bedtime. 07/31/20   [provider]  HYDROcodone-acetaminophen (NORCO) 10-325 MG tablet Take 1 tablet by mouth daily. Patient not taking: Reported on 03/13/2023    [provider]  isosorbide-hydrALAZINE (  BIDIL) 20-37.5 MG tablet Take 2 tablets by mouth 3 (three) times daily. 03/13/23   Patwardhan, Anabel Bene, MD  metoprolol succinate (TOPROL-XL) 100 MG 24 hr tablet Take 1 tablet (100 mg total) by mouth daily. Take with or immediately following a meal. 03/13/23   Patwardhan, Manish J, MD  Multiple Vitamin (MULTIVITAMIN WITH MINERALS) TABS tablet Take 1 tablet by mouth daily. 08/16/18   Sheikh, Kateri Mc Latif, DO  nitroGLYCERIN (NITROSTAT) 0.4 MG SL tablet Place 1 tablet (0.4 mg total) under the tongue every 5 (five) minutes as needed for chest pain. 05/02/22   Patwardhan, Anabel Bene, MD  torsemide (DEMADEX) 20 MG tablet Take 1 tablet (20 mg total) by mouth 2 (two) times daily. 03/13/23   Patwardhan, Anabel Bene, MD  traZODone (DESYREL) 100 MG tablet Take 1 tablet (100 mg total) by mouth at bedtime. 03/13/23   Elder Negus, MD     ___________________________________________________________________________________________________ Physical Exam:    05/04/2023   10:00 PM 05/04/2023    9:45 PM 05/04/2023    8:20 PM  Vitals with BMI  Systolic 162 166 161  Diastolic 96 81 73  Pulse 66 66 64     1. General:  in No  Acute distress   Chronically ill  -appearing 2. Psychological: Alert and   Oriented 3. Head/ENT:   Moist  Mucous Membranes                          Head Non traumatic, neck supple                            Poor Dentition 4. SKIN: normal  Skin turgor,  Skin clean Dry and intact no rash Venous stasis changes of lower extremity bilaterally left heel with evidence of possible necrosis versus abscess    5. Heart: Regular rate and rhythm no  Murmur, no Rub or gallop 6. Lungs:  no wheezes some  crackles  decreased on the right 7. Abdomen: Soft,  non-tender, Non distended   obese  bowel sounds present 8. Lower extremities: no clubbing, cyanosis  2+ edema 9. Neurologically Grossly intact, moving all 4 extremities equally   10. MSK: Normal range of motion    Chart has been reviewed  ______________________________________________________________________________________________  Assessment/Plan 77 y.o. male with medical history significant of CAD, diastolic CHF, pulmonary hypertension, and CKD   Admitted for   Acute systolic congestive heart failure, possible abscess of left foot, pleural effusion on the right      Present on Admission:  Diastolic CHF (HCC)  Essential hypertension  Stage 3b chronic kidney disease (HCC)  Type 2 diabetes mellitus with hyperlipidemia (HCC)  PAD (peripheral artery disease) (HCC)  Diabetic foot infection (HCC)  Acute on chronic diastolic CHF (congestive heart failure) (HCC)  Abscess of left foot  Anemia  Coronary artery disease involving native coronary artery of native heart without angina pectoris  Mixed hyperlipidemia  Sacral wound  Pleural effusion  Elevated  TSH     Essential hypertension Continue home meds  NOrvasc 10 mg po q day Bidil Hold toprol for tonight  Pt has not been compliant with meds according to family   Stage 3b chronic kidney disease (HCC)  -chronic avoid nephrotoxic medications such as NSAIDs, Vanco Zosyn combo,  avoid hypotension, continue to follow renal function   Type 2 diabetes mellitus with hyperlipidemia (HCC)  - Order Sensitive SSI    -  check TSH and HgA1C  - Hold by mouth medications  Pt states he is on 75/25 mix 36 units Decresed to nPH 20 units q day for now  Diabetes coordinator consult    PAD (peripheral artery disease) (HCC) Continue Aspirin 81 mg po q day cont lipiotr 40 mg po q day  Diabetic foot infection (HCC) Start on rocephin, metronidazole, vanc   Order sed rate, ESR Plain imaging MRi in am   Will need orthopedics consult in AM   Acute on chronic diastolic CHF (congestive heart failure) (HCC) - Pt diagnosed with CHF based on presence of the following:  PND, OA, rales on exam, JVD,  , Pulmonary edema on CXR, and   bilateral leg edema,  pleural effusion  With noted response to IV diuretic in ER  admit on telemetry,  cycle cardiac enzymes, Troponin 14    obtain serial ECG  to evaluate for ischemia as a cause of heart failure  monitor daily weight:  Filed Weights   05/05/23 0000  Weight: 90.7 kg   Last BNP BNP (last 3 results) Recent Labs    06/29/22 0454 05/04/23 2047  BNP 243.4* 455.1*       diurese with IV lasix 40 mg q day and monitor orthostatics and creatinine to avoid over diuresis.  Order echogram to evaluate EF and valves  ACE/ARBi  Contraindicated        Abscess of left foot Ordered Sed rate CRP  Start on rocephin, metronidazole vanc Will need imaging and ABI  Start with plain films and likley will need MRI for AM  Will need orthopedics consult in AM   Anemia Obtain anemia panel  Transfuse for Hg <7 , rapidly dropping or  if  symptomatic   Coronary artery disease involving native coronary artery of native heart without angina pectoris Continue Aspirin 81 mg po q day and lipitor 40 mgpo po q day   Mixed hyperlipidemia Continue Lipitor 40 mg po q day  Sacral wound Would benefit from wound care consult  Pleural effusion Noted Righ side pleural effusion Pt with no fever has chronic dry cough  Doubt infection Ordered CT chest to further eval  May need thoracentesis if not improved  Elevated TSH Check T3 t4    Other plan as per orders.  DVT prophylaxis:  SCD      Code Status:    Code Status: Prior FULL CODE   as per patient   I had personally discussed CODE STATUS with patient and family   ACP   none    Family Communication:   Family   at  Bedside  plan of care was discussed   with  Wife,   Diet diabetic Npo post midnight   Disposition Plan:    To home once workup is complete and patient is stable   Following barriers for discharge:                                                         Electrolytes corrected                               Anemia   stable  Pain controlled with PO medications                                                           Will need consultants to evaluate patient prior to discharge       Consult Orders  (From admission, onward)           Start     Ordered   05/04/23 2154  Consult to hospitalist  Paged by Annice Pih  Once       Provider:  (Not yet assigned)  Question Answer Comment  Place call to: [facility]   Reason for Consult Admit      05/04/23 2154                               Would benefit from PT/OT eval prior to DC  Ordered                                       Diabetes care coordinator                                       Nutrition    consulted                  Wound care  consulted                                       Consults called: Please consult orthopedics in a.m. May need IR tap for  thoracentesis if large pleural effusion on CT   Admission status:  ED Disposition     ED Disposition  Admit   Condition  --   Comment  Hospital Area: [facility] [[id number]]  Level of Care: Telemetry Cardiac [[id number]]  May place patient in observation at [facility] or [facility] if equivalent level of care is available:: No  Covid Evaluation: Asymptomatic - no recent exposure (last 10 days) testing not required  Diagnosis: Diastolic CHF [facility]) [[id number]]  Admitting Physician: Therisa Doyne [[id number]]  Attending Physician: Therisa Doyne [[id number]]           Obs      Level of care     tele  For  24H      Lab Results  Component Value Date   SARSCOV2NAA NEGATIVE 06/29/2022      David [name] 05/05/2023, 1:16 AM    [facility]     after 2 AM please page floor coverage PA If 7AM-7PM, please contact the day team taking care of the patient using [email or web address]

## 2023-05-04 NOTE — Assessment & Plan Note (Signed)
Continue Aspirin 81 mg po q day cont lipiotr 40 mg po q day

## 2023-05-04 NOTE — Subjective & Objective (Signed)
Patient presents from home with shortness of breath and diarrhea for the past 3 weeks has known history of CHF 95% on room air but having increased shortness of breath so started on 2 L Reports for the past 3 weeks has been progressively feeling weak Foul-smelling urine Not on oxygen baseline May have gained some weight he has chronic leg edema reports mild cough no chest pain no fever

## 2023-05-04 NOTE — Assessment & Plan Note (Addendum)
Continue home meds  NOrvasc 10 mg po q day Bidil Hold toprol for tonight  Pt has not been compliant with meds according to family

## 2023-05-04 NOTE — Assessment & Plan Note (Signed)
-  chronic avoid nephrotoxic medications such as NSAIDs, Vanco Zosyn combo,  avoid hypotension, continue to follow renal function  

## 2023-05-04 NOTE — Assessment & Plan Note (Addendum)
-   Order Sensitive SSI    -  check TSH and HgA1C  - Hold by mouth medications  Pt states he is on 75/25 mix 36 units Decresed to nPH 20 units q day for now  Diabetes coordinator consult

## 2023-05-04 NOTE — ED Notes (Signed)
Patient refused to get on bed from wheelchair. Was yelling and cursing at staff.

## 2023-05-05 ENCOUNTER — Observation Stay (HOSPITAL_COMMUNITY): Payer: Medicare Other

## 2023-05-05 ENCOUNTER — Inpatient Hospital Stay (HOSPITAL_COMMUNITY): Payer: Medicare Other

## 2023-05-05 ENCOUNTER — Encounter (HOSPITAL_COMMUNITY): Payer: Self-pay | Admitting: Internal Medicine

## 2023-05-05 ENCOUNTER — Other Ambulatory Visit: Payer: Self-pay

## 2023-05-05 DIAGNOSIS — J9811 Atelectasis: Secondary | ICD-10-CM | POA: Diagnosis not present

## 2023-05-05 DIAGNOSIS — E11319 Type 2 diabetes mellitus with unspecified diabetic retinopathy without macular edema: Secondary | ICD-10-CM | POA: Diagnosis not present

## 2023-05-05 DIAGNOSIS — J918 Pleural effusion in other conditions classified elsewhere: Secondary | ICD-10-CM | POA: Diagnosis not present

## 2023-05-05 DIAGNOSIS — E1169 Type 2 diabetes mellitus with other specified complication: Secondary | ICD-10-CM | POA: Diagnosis present

## 2023-05-05 DIAGNOSIS — S31000A Unspecified open wound of lower back and pelvis without penetration into retroperitoneum, initial encounter: Secondary | ICD-10-CM | POA: Diagnosis present

## 2023-05-05 DIAGNOSIS — R0602 Shortness of breath: Secondary | ICD-10-CM | POA: Diagnosis not present

## 2023-05-05 DIAGNOSIS — R0902 Hypoxemia: Secondary | ICD-10-CM | POA: Diagnosis not present

## 2023-05-05 DIAGNOSIS — I739 Peripheral vascular disease, unspecified: Secondary | ICD-10-CM

## 2023-05-05 DIAGNOSIS — N1832 Chronic kidney disease, stage 3b: Secondary | ICD-10-CM | POA: Diagnosis present

## 2023-05-05 DIAGNOSIS — L03115 Cellulitis of right lower limb: Secondary | ICD-10-CM | POA: Diagnosis not present

## 2023-05-05 DIAGNOSIS — E1140 Type 2 diabetes mellitus with diabetic neuropathy, unspecified: Secondary | ICD-10-CM | POA: Diagnosis not present

## 2023-05-05 DIAGNOSIS — I5033 Acute on chronic diastolic (congestive) heart failure: Secondary | ICD-10-CM | POA: Diagnosis not present

## 2023-05-05 DIAGNOSIS — I110 Hypertensive heart disease with heart failure: Secondary | ICD-10-CM | POA: Diagnosis not present

## 2023-05-05 DIAGNOSIS — I130 Hypertensive heart and chronic kidney disease with heart failure and stage 1 through stage 4 chronic kidney disease, or unspecified chronic kidney disease: Secondary | ICD-10-CM | POA: Diagnosis not present

## 2023-05-05 DIAGNOSIS — J90 Pleural effusion, not elsewhere classified: Secondary | ICD-10-CM | POA: Diagnosis present

## 2023-05-05 DIAGNOSIS — Z79899 Other long term (current) drug therapy: Secondary | ICD-10-CM | POA: Diagnosis not present

## 2023-05-05 DIAGNOSIS — E782 Mixed hyperlipidemia: Secondary | ICD-10-CM | POA: Diagnosis present

## 2023-05-05 DIAGNOSIS — R946 Abnormal results of thyroid function studies: Secondary | ICD-10-CM | POA: Diagnosis present

## 2023-05-05 DIAGNOSIS — Z743 Need for continuous supervision: Secondary | ICD-10-CM | POA: Diagnosis not present

## 2023-05-05 DIAGNOSIS — I501 Left ventricular failure: Secondary | ICD-10-CM | POA: Diagnosis not present

## 2023-05-05 DIAGNOSIS — Z7401 Bed confinement status: Secondary | ICD-10-CM | POA: Diagnosis not present

## 2023-05-05 DIAGNOSIS — Z8249 Family history of ischemic heart disease and other diseases of the circulatory system: Secondary | ICD-10-CM | POA: Diagnosis not present

## 2023-05-05 DIAGNOSIS — R140 Abdominal distension (gaseous): Secondary | ICD-10-CM | POA: Diagnosis not present

## 2023-05-05 DIAGNOSIS — L089 Local infection of the skin and subcutaneous tissue, unspecified: Secondary | ICD-10-CM | POA: Diagnosis present

## 2023-05-05 DIAGNOSIS — E1151 Type 2 diabetes mellitus with diabetic peripheral angiopathy without gangrene: Secondary | ICD-10-CM | POA: Diagnosis present

## 2023-05-05 DIAGNOSIS — R0600 Dyspnea, unspecified: Secondary | ICD-10-CM | POA: Diagnosis not present

## 2023-05-05 DIAGNOSIS — N184 Chronic kidney disease, stage 4 (severe): Secondary | ICD-10-CM | POA: Diagnosis not present

## 2023-05-05 DIAGNOSIS — E1165 Type 2 diabetes mellitus with hyperglycemia: Secondary | ICD-10-CM | POA: Diagnosis present

## 2023-05-05 DIAGNOSIS — J984 Other disorders of lung: Secondary | ICD-10-CM | POA: Diagnosis not present

## 2023-05-05 DIAGNOSIS — I5043 Acute on chronic combined systolic (congestive) and diastolic (congestive) heart failure: Secondary | ICD-10-CM | POA: Diagnosis not present

## 2023-05-05 DIAGNOSIS — J9621 Acute and chronic respiratory failure with hypoxia: Secondary | ICD-10-CM | POA: Diagnosis not present

## 2023-05-05 DIAGNOSIS — Z1152 Encounter for screening for COVID-19: Secondary | ICD-10-CM | POA: Diagnosis not present

## 2023-05-05 DIAGNOSIS — J189 Pneumonia, unspecified organism: Secondary | ICD-10-CM | POA: Diagnosis present

## 2023-05-05 DIAGNOSIS — R7989 Other specified abnormal findings of blood chemistry: Secondary | ICD-10-CM | POA: Diagnosis present

## 2023-05-05 DIAGNOSIS — I440 Atrioventricular block, first degree: Secondary | ICD-10-CM | POA: Diagnosis not present

## 2023-05-05 DIAGNOSIS — E785 Hyperlipidemia, unspecified: Secondary | ICD-10-CM | POA: Diagnosis not present

## 2023-05-05 DIAGNOSIS — Z89422 Acquired absence of other left toe(s): Secondary | ICD-10-CM | POA: Diagnosis not present

## 2023-05-05 DIAGNOSIS — Z955 Presence of coronary angioplasty implant and graft: Secondary | ICD-10-CM | POA: Diagnosis not present

## 2023-05-05 DIAGNOSIS — M5030 Other cervical disc degeneration, unspecified cervical region: Secondary | ICD-10-CM | POA: Diagnosis not present

## 2023-05-05 DIAGNOSIS — N179 Acute kidney failure, unspecified: Secondary | ICD-10-CM | POA: Diagnosis not present

## 2023-05-05 DIAGNOSIS — Z794 Long term (current) use of insulin: Secondary | ICD-10-CM | POA: Diagnosis not present

## 2023-05-05 DIAGNOSIS — T501X6A Underdosing of loop [high-ceiling] diuretics, initial encounter: Secondary | ICD-10-CM | POA: Diagnosis present

## 2023-05-05 DIAGNOSIS — J9601 Acute respiratory failure with hypoxia: Secondary | ICD-10-CM | POA: Diagnosis present

## 2023-05-05 DIAGNOSIS — I2510 Atherosclerotic heart disease of native coronary artery without angina pectoris: Secondary | ICD-10-CM | POA: Diagnosis not present

## 2023-05-05 DIAGNOSIS — Z96641 Presence of right artificial hip joint: Secondary | ICD-10-CM | POA: Diagnosis not present

## 2023-05-05 DIAGNOSIS — R918 Other nonspecific abnormal finding of lung field: Secondary | ICD-10-CM | POA: Diagnosis not present

## 2023-05-05 DIAGNOSIS — G4700 Insomnia, unspecified: Secondary | ICD-10-CM | POA: Diagnosis present

## 2023-05-05 DIAGNOSIS — M79672 Pain in left foot: Secondary | ICD-10-CM | POA: Diagnosis present

## 2023-05-05 DIAGNOSIS — R069 Unspecified abnormalities of breathing: Secondary | ICD-10-CM | POA: Diagnosis not present

## 2023-05-05 DIAGNOSIS — E440 Moderate protein-calorie malnutrition: Secondary | ICD-10-CM | POA: Diagnosis present

## 2023-05-05 DIAGNOSIS — Z4682 Encounter for fitting and adjustment of non-vascular catheter: Secondary | ICD-10-CM | POA: Diagnosis not present

## 2023-05-05 DIAGNOSIS — R600 Localized edema: Secondary | ICD-10-CM | POA: Diagnosis not present

## 2023-05-05 DIAGNOSIS — I499 Cardiac arrhythmia, unspecified: Secondary | ICD-10-CM | POA: Diagnosis not present

## 2023-05-05 DIAGNOSIS — D631 Anemia in chronic kidney disease: Secondary | ICD-10-CM | POA: Diagnosis not present

## 2023-05-05 DIAGNOSIS — S91301A Unspecified open wound, right foot, initial encounter: Secondary | ICD-10-CM | POA: Diagnosis not present

## 2023-05-05 DIAGNOSIS — R6889 Other general symptoms and signs: Secondary | ICD-10-CM | POA: Diagnosis not present

## 2023-05-05 DIAGNOSIS — I509 Heart failure, unspecified: Secondary | ICD-10-CM | POA: Diagnosis not present

## 2023-05-05 DIAGNOSIS — Z87891 Personal history of nicotine dependence: Secondary | ICD-10-CM | POA: Diagnosis not present

## 2023-05-05 DIAGNOSIS — E1122 Type 2 diabetes mellitus with diabetic chronic kidney disease: Secondary | ICD-10-CM | POA: Diagnosis not present

## 2023-05-05 DIAGNOSIS — M7989 Other specified soft tissue disorders: Secondary | ICD-10-CM | POA: Diagnosis not present

## 2023-05-05 DIAGNOSIS — H409 Unspecified glaucoma: Secondary | ICD-10-CM | POA: Diagnosis not present

## 2023-05-05 DIAGNOSIS — Z91128 Patient's intentional underdosing of medication regimen for other reason: Secondary | ICD-10-CM | POA: Diagnosis not present

## 2023-05-05 DIAGNOSIS — I272 Pulmonary hypertension, unspecified: Secondary | ICD-10-CM | POA: Diagnosis not present

## 2023-05-05 DIAGNOSIS — Z6827 Body mass index (BMI) 27.0-27.9, adult: Secondary | ICD-10-CM | POA: Diagnosis not present

## 2023-05-05 DIAGNOSIS — Z5982 Transportation insecurity: Secondary | ICD-10-CM | POA: Diagnosis not present

## 2023-05-05 DIAGNOSIS — E119 Type 2 diabetes mellitus without complications: Secondary | ICD-10-CM | POA: Diagnosis not present

## 2023-05-05 DIAGNOSIS — R739 Hyperglycemia, unspecified: Secondary | ICD-10-CM | POA: Diagnosis not present

## 2023-05-05 LAB — ECHOCARDIOGRAM COMPLETE
Area-P 1/2: 3.11 cm2
Height: 72 in
S' Lateral: 4.8 cm
Weight: 3200 [oz_av]

## 2023-05-05 LAB — FOLATE: Folate: 32.9 ng/mL (ref >5.9)

## 2023-05-05 LAB — COMPREHENSIVE METABOLIC PANEL
ALT: 29 U/L (ref 0–44)
AST: 23 U/L (ref 15–41)
Albumin: 2.8 g/dL — ABNORMAL LOW (ref 3.5–5.0)
Alkaline Phosphatase: 143 U/L — ABNORMAL HIGH (ref 38–126)
Anion gap: 5 (ref 5–15)
BUN: 40 mg/dL — ABNORMAL HIGH (ref 8–23)
CO2: 23 mmol/L (ref 22–32)
Calcium: 8.4 mg/dL — ABNORMAL LOW (ref 8.9–10.3)
Chloride: 107 mmol/L (ref 98–111)
Creatinine, Ser: 1.93 mg/dL — ABNORMAL HIGH (ref 0.61–1.24)
GFR, Estimated: 35 mL/min — ABNORMAL LOW (ref >60)
Glucose, Bld: 179 mg/dL — ABNORMAL HIGH (ref 70–99)
Potassium: 4.1 mmol/L (ref 3.5–5.1)
Sodium: 135 mmol/L (ref 135–145)
Total Bilirubin: 1.1 mg/dL (ref <1.2)
Total Protein: 7.1 g/dL (ref 6.5–8.1)

## 2023-05-05 LAB — CBC
HCT: 27.4 % — ABNORMAL LOW (ref 39.0–52.0)
Hemoglobin: 8.6 g/dL — ABNORMAL LOW (ref 13.0–17.0)
MCH: 34.7 pg — ABNORMAL HIGH (ref 26.0–34.0)
MCHC: 31.4 g/dL (ref 30.0–36.0)
MCV: 110.5 fL — ABNORMAL HIGH (ref 80.0–100.0)
Platelets: 279 10*3/uL (ref 150–400)
RBC: 2.48 MIL/uL — ABNORMAL LOW (ref 4.22–5.81)
RDW: 14.2 % (ref 11.5–15.5)
WBC: 8.1 10*3/uL (ref 4.0–10.5)
nRBC: 0 % (ref 0.0–0.2)

## 2023-05-05 LAB — GLUCOSE, CAPILLARY
Glucose-Capillary: 168 mg/dL — ABNORMAL HIGH (ref 70–99)
Glucose-Capillary: 174 mg/dL — ABNORMAL HIGH (ref 70–99)

## 2023-05-05 LAB — PROCALCITONIN: Procalcitonin: <0.1 ng/mL

## 2023-05-05 LAB — CBG MONITORING, ED
Glucose-Capillary: 117 mg/dL — ABNORMAL HIGH (ref 70–99)
Glucose-Capillary: 166 mg/dL — ABNORMAL HIGH (ref 70–99)
Glucose-Capillary: 201 mg/dL — ABNORMAL HIGH (ref 70–99)
Glucose-Capillary: 207 mg/dL — ABNORMAL HIGH (ref 70–99)

## 2023-05-05 LAB — PHOSPHORUS: Phosphorus: 4.5 mg/dL (ref 2.5–4.6)

## 2023-05-05 LAB — T4, FREE: Free T4: 1.14 ng/dL — ABNORMAL HIGH (ref 0.61–1.12)

## 2023-05-05 LAB — C-REACTIVE PROTEIN: CRP: 3.4 mg/dL — ABNORMAL HIGH (ref <1.0)

## 2023-05-05 LAB — SEDIMENTATION RATE: Sed Rate: 96 mm/h — ABNORMAL HIGH (ref 0–16)

## 2023-05-05 LAB — MAGNESIUM: Magnesium: 2.5 mg/dL — ABNORMAL HIGH (ref 1.7–2.4)

## 2023-05-05 LAB — VITAMIN B12: Vitamin B-12: 1002 pg/mL — ABNORMAL HIGH (ref 180–914)

## 2023-05-05 LAB — PREALBUMIN: Prealbumin: 20 mg/dL (ref 18–38)

## 2023-05-05 MED ORDER — VANCOMYCIN HCL IN DEXTROSE 1-5 GM/200ML-% IV SOLN
1000.0000 mg | Freq: Once | INTRAVENOUS | Status: AC
  Administered 2023-05-05: 1000 mg via INTRAVENOUS
  Filled 2023-05-05: qty 200

## 2023-05-05 MED ORDER — GERHARDT'S BUTT CREAM
TOPICAL_CREAM | Freq: Two times a day (BID) | CUTANEOUS | Status: DC
  Administered 2023-05-06: 1 via TOPICAL
  Filled 2023-05-05: qty 60

## 2023-05-05 MED ORDER — HYDROCERIN EX CREA
TOPICAL_CREAM | Freq: Every day | CUTANEOUS | Status: DC
  Filled 2023-05-05: qty 113

## 2023-05-05 MED ORDER — INSULIN NPH (HUMAN) (ISOPHANE) 100 UNIT/ML ~~LOC~~ SUSP: 20.0000 [IU] | Freq: Every day | SUBCUTANEOUS | Status: DC

## 2023-05-05 MED ORDER — ACETAMINOPHEN 325 MG PO TABS
650.0000 mg | ORAL_TABLET | Freq: Four times a day (QID) | ORAL | Status: DC | PRN
  Administered 2023-05-05: 650 mg via ORAL
  Filled 2023-05-05: qty 2

## 2023-05-05 MED ORDER — SODIUM CHLORIDE 0.9 % IV SOLN
250.0000 mL | INTRAVENOUS | Status: AC | PRN
Start: 2023-05-05 — End: 2023-05-06

## 2023-05-05 MED ORDER — GUAIFENESIN ER 600 MG PO TB12
600.0000 mg | ORAL_TABLET | Freq: Two times a day (BID) | ORAL | Status: DC
  Administered 2023-05-05 – 2023-05-10 (×11): 600 mg via ORAL
  Filled 2023-05-05 (×12): qty 1

## 2023-05-05 MED ORDER — INSULIN ASPART 100 UNIT/ML IJ SOLN
0.0000 [IU] | INTRAMUSCULAR | Status: DC
  Administered 2023-05-05: 2 [IU] via SUBCUTANEOUS
  Administered 2023-05-05: 3 [IU] via SUBCUTANEOUS
  Administered 2023-05-05 (×2): 2 [IU] via SUBCUTANEOUS
  Administered 2023-05-06 (×6): 1 [IU] via SUBCUTANEOUS
  Administered 2023-05-07: 2 [IU] via SUBCUTANEOUS
  Administered 2023-05-07: 5 [IU] via SUBCUTANEOUS
  Administered 2023-05-07: 2 [IU] via SUBCUTANEOUS
  Administered 2023-05-07 (×2): 1 [IU] via SUBCUTANEOUS
  Administered 2023-05-08: 2 [IU] via SUBCUTANEOUS
  Administered 2023-05-08: 1 [IU] via SUBCUTANEOUS
  Administered 2023-05-08: 2 [IU] via SUBCUTANEOUS
  Administered 2023-05-08 – 2023-05-09 (×5): 1 [IU] via SUBCUTANEOUS

## 2023-05-05 MED ORDER — PNEUMOCOCCAL 20-VAL CONJ VACC 0.5 ML IM SUSY
0.5000 mL | PREFILLED_SYRINGE | INTRAMUSCULAR | Status: DC
  Filled 2023-05-05: qty 0.5

## 2023-05-05 MED ORDER — SODIUM CHLORIDE 0.9% FLUSH
3.0000 mL | Freq: Two times a day (BID) | INTRAVENOUS | Status: DC
  Administered 2023-05-05 – 2023-05-10 (×10): 3 mL via INTRAVENOUS

## 2023-05-05 MED ORDER — TRAZODONE HCL 50 MG PO TABS
100.0000 mg | ORAL_TABLET | Freq: Every day | ORAL | Status: DC
  Administered 2023-05-05: 100 mg via ORAL
  Filled 2023-05-05: qty 2

## 2023-05-05 MED ORDER — ONDANSETRON HCL 4 MG/2ML IJ SOLN: 4.0000 mg | Freq: Four times a day (QID) | INTRAMUSCULAR | Status: DC | PRN

## 2023-05-05 MED ORDER — FUROSEMIDE 10 MG/ML IJ SOLN
40.0000 mg | Freq: Every day | INTRAMUSCULAR | Status: DC
  Administered 2023-05-05: 40 mg via INTRAVENOUS
  Filled 2023-05-05: qty 4

## 2023-05-05 MED ORDER — ALPRAZOLAM 0.25 MG PO TABS: 1.0000 mg | ORAL_TABLET | Freq: Every evening | ORAL | Status: DC | PRN

## 2023-05-05 MED ORDER — VANCOMYCIN HCL IN DEXTROSE 1-5 GM/200ML-% IV SOLN
1000.0000 mg | INTRAVENOUS | Status: DC
Start: 2023-05-06 — End: 2023-05-07
  Administered 2023-05-06 – 2023-05-07 (×2): 1000 mg via INTRAVENOUS
  Filled 2023-05-05 (×2): qty 200

## 2023-05-05 MED ORDER — SODIUM CHLORIDE 0.9 % IV SOLN
2.0000 g | INTRAVENOUS | Status: DC
  Administered 2023-05-05 – 2023-05-10 (×6): 2 g via INTRAVENOUS
  Filled 2023-05-05 (×6): qty 20

## 2023-05-05 MED ORDER — SODIUM CHLORIDE 0.9% FLUSH: 3.0000 mL | INTRAVENOUS | Status: DC | PRN

## 2023-05-05 MED ORDER — ONDANSETRON HCL 4 MG PO TABS: 4.0000 mg | ORAL_TABLET | Freq: Four times a day (QID) | ORAL | Status: DC | PRN

## 2023-05-05 MED ORDER — HYDROCODONE-ACETAMINOPHEN 5-325 MG PO TABS
1.0000 | ORAL_TABLET | ORAL | Status: DC | PRN
  Administered 2023-05-06 – 2023-05-08 (×11): 2 via ORAL
  Administered 2023-05-09: 1 via ORAL
  Administered 2023-05-09 – 2023-05-11 (×5): 2 via ORAL
  Administered 2023-05-11: 1 via ORAL
  Filled 2023-05-05 (×18): qty 2

## 2023-05-05 MED ORDER — ALBUTEROL SULFATE (2.5 MG/3ML) 0.083% IN NEBU: 2.5000 mg | INHALATION_SOLUTION | RESPIRATORY_TRACT | Status: DC | PRN

## 2023-05-05 MED ORDER — METRONIDAZOLE 500 MG/100ML IV SOLN
500.0000 mg | Freq: Two times a day (BID) | INTRAVENOUS | Status: DC
  Administered 2023-05-05 – 2023-05-07 (×5): 500 mg via INTRAVENOUS
  Filled 2023-05-05 (×5): qty 100

## 2023-05-05 MED ORDER — PERFLUTREN LIPID MICROSPHERE
1.0000 mL | INTRAVENOUS | Status: AC | PRN
  Administered 2023-05-05: 2 mL via INTRAVENOUS

## 2023-05-05 MED ORDER — ISOSORB DINITRATE-HYDRALAZINE 20-37.5 MG PO TABS
2.0000 | ORAL_TABLET | Freq: Three times a day (TID) | ORAL | Status: DC
  Administered 2023-05-05 – 2023-05-11 (×20): 2 via ORAL
  Filled 2023-05-05 (×20): qty 2

## 2023-05-05 MED ORDER — ATORVASTATIN CALCIUM 40 MG PO TABS
40.0000 mg | ORAL_TABLET | Freq: Every day | ORAL | Status: DC
  Administered 2023-05-05 – 2023-05-10 (×6): 40 mg via ORAL
  Filled 2023-05-05 (×7): qty 1

## 2023-05-05 MED ORDER — AMLODIPINE BESYLATE 10 MG PO TABS
10.0000 mg | ORAL_TABLET | Freq: Every day | ORAL | Status: DC
  Administered 2023-05-05 – 2023-05-10 (×6): 10 mg via ORAL
  Filled 2023-05-05: qty 2
  Filled 2023-05-05 (×6): qty 1

## 2023-05-05 MED ORDER — LORAZEPAM 2 MG/ML IJ SOLN
1.0000 mg | Freq: Once | INTRAMUSCULAR | Status: AC
  Administered 2023-05-05: 1 mg via INTRAVENOUS
  Filled 2023-05-05: qty 1

## 2023-05-05 MED ORDER — INFLUENZA VAC A&B SURF ANT ADJ 0.5 ML IM SUSY
0.5000 mL | PREFILLED_SYRINGE | INTRAMUSCULAR | Status: DC
  Filled 2023-05-05: qty 0.5

## 2023-05-05 MED ORDER — HYDROCODONE-ACETAMINOPHEN 5-325 MG PO TABS
1.0000 | ORAL_TABLET | ORAL | Status: DC | PRN
Start: 2023-05-05 — End: 2023-05-05
  Administered 2023-05-05: 2 via ORAL
  Administered 2023-05-05: 1 via ORAL
  Administered 2023-05-05: 2 via ORAL
  Filled 2023-05-05 (×3): qty 2

## 2023-05-05 MED ORDER — ACETAMINOPHEN 650 MG RE SUPP
650.0000 mg | Freq: Four times a day (QID) | RECTAL | Status: DC | PRN
Start: 2023-05-05 — End: 2023-05-11

## 2023-05-05 NOTE — Plan of Care (Signed)
  Problem: Education: Goal: Ability to describe self-care measures that may prevent or decrease complications (Diabetes Survival Skills Education) will improve Outcome: Not Progressing Goal: Individualized Educational Video(s) Outcome: Not Progressing   Problem: Coping: Goal: Ability to adjust to condition or change in health will improve Outcome: Not Progressing   Problem: Fluid Volume: Goal: Ability to maintain a balanced intake and output will improve Outcome: Not Progressing   Problem: Health Behavior/Discharge Planning: Goal: Ability to identify and utilize available resources and services will improve Outcome: Not Progressing Goal: Ability to manage health-related needs will improve Outcome: Not Progressing   Problem: Metabolic: Goal: Ability to maintain appropriate glucose levels will improve Outcome: Not Progressing   Problem: Nutritional: Goal: Maintenance of adequate nutrition will improve Outcome: Not Progressing Goal: Progress toward achieving an optimal weight will improve Outcome: Not Progressing   Problem: Skin Integrity: Goal: Risk for impaired skin integrity will decrease Outcome: Not Progressing   Problem: Tissue Perfusion: Goal: Adequacy of tissue perfusion will improve Outcome: Not Progressing   Problem: Education: Goal: Knowledge of General Education information will improve Description: Including pain rating scale, medication(s)/side effects and non-pharmacologic comfort measures Outcome: Not Progressing   Problem: Health Behavior/Discharge Planning: Goal: Ability to manage health-related needs will improve Outcome: Not Progressing   Problem: Clinical Measurements: Goal: Ability to maintain clinical measurements within normal limits will improve Outcome: Not Progressing Goal: Will remain free from infection Outcome: Not Progressing Goal: Diagnostic test results will improve Outcome: Not Progressing Goal: Respiratory complications will  improve Outcome: Not Progressing Goal: Cardiovascular complication will be avoided Outcome: Not Progressing   Problem: Activity: Goal: Risk for activity intolerance will decrease Outcome: Not Progressing   Problem: Nutrition: Goal: Adequate nutrition will be maintained Outcome: Not Progressing   Problem: Coping: Goal: Level of anxiety will decrease Outcome: Not Progressing   Problem: Elimination: Goal: Will not experience complications related to bowel motility Outcome: Not Progressing Goal: Will not experience complications related to urinary retention Outcome: Not Progressing   Problem: Pain Management: Goal: General experience of comfort will improve Outcome: Not Progressing   Problem: Safety: Goal: Ability to remain free from injury will improve Outcome: Not Progressing   Problem: Skin Integrity: Goal: Risk for impaired skin integrity will decrease Outcome: Not Progressing   Problem: Education: Goal: Ability to demonstrate management of disease process will improve Outcome: Not Progressing Goal: Ability to verbalize understanding of medication therapies will improve Outcome: Not Progressing Goal: Individualized Educational Video(s) Outcome: Not Progressing   Problem: Activity: Goal: Capacity to carry out activities will improve Outcome: Not Progressing   Problem: Cardiac: Goal: Ability to achieve and maintain adequate cardiopulmonary perfusion will improve Outcome: Not Progressing

## 2023-05-05 NOTE — ED Notes (Signed)
PT at bedside.

## 2023-05-05 NOTE — Progress Notes (Signed)
Attempted to bring patient down for MRI, still refusing to have exam.

## 2023-05-05 NOTE — Progress Notes (Addendum)
Patient arrived to room 3E01 in NAD, VS stable and patient free from pain. Patient oriented to room and call bell in reach. Wife made aware of patient's arrival. No belongings with patient. Patient's wife bringing ipad and phone, advised about belonging policy.   Unable to get standing weight, patient can't stand.

## 2023-05-05 NOTE — ED Notes (Signed)
Returned from MRI 

## 2023-05-05 NOTE — Assessment & Plan Note (Signed)
Noted Righ side pleural effusion Pt with no fever has chronic dry cough  Doubt infection Ordered CT chest to further eval  May need thoracentesis if not improved

## 2023-05-05 NOTE — ED Notes (Signed)
This RN to pt room to check pulse ox with ambulation per order. Pt communicates with this RN "I can't walk" This RN to encourage pt, pt does not want to try. This RN notified attending.

## 2023-05-05 NOTE — Assessment & Plan Note (Signed)
-   Pt diagnosed with CHF based on presence of the following:  PND, OA, rales on exam, JVD,  , Pulmonary edema on CXR, and   bilateral leg edema,  pleural effusion  With noted response to IV diuretic in ER  admit on telemetry,  cycle cardiac enzymes, Troponin 14    obtain serial ECG  to evaluate for ischemia as a cause of heart failure  monitor daily weight:  Filed Weights   05/05/23 0000  Weight: 90.7 kg   Last BNP BNP (last 3 results) Recent Labs    06/29/22 0454 05/04/23 2047  BNP 243.4* 455.1*       diurese with IV lasix 40 mg q day and monitor orthostatics and creatinine to avoid over diuresis.  Order echogram to evaluate EF and valves  ACE/ARBi  Contraindicated

## 2023-05-05 NOTE — Consult Note (Addendum)
WOC Nurse Consult Note: Consult requested for sacrum and bilat legs.  Pt has pink intact scar tissue to sacrum/buttocks from previous wounds which have healed.  Currently no open wounds.  Bilat buttocks/sacrum with red moist macerated skin; appearance is consistent with moisture associated skin damage. Pt is incontinent of stool at times, so leave foam dressing off to avoid trapping stool against the skin.  Bilat legs with dry crusted loose nonviable skin.  Few scattered patchy areas of pink moist partial thickness skin loss revealed underneath where skin has fallen off.  Assessed bilat heels and there are no pressure injuries.   Topical treatment orders provided for bedside nurses to perform as follows to protect and promote healing: Apply Eucerin cream to bilat legs Q day and lightly scrub with a towel after each bathing to assist with removal of loose skin. Apply Gerhardts cream to buttocks BID and PRN when turning or cleaning Please re-consult if further assistance is needed.  Thank-you,  Cammie Mcgee MSN, RN, CWOCN, [place], CNS [phone]

## 2023-05-05 NOTE — Assessment & Plan Note (Signed)
Would benefit from wound care consult

## 2023-05-05 NOTE — Assessment & Plan Note (Signed)
Start on rocephin, metronidazole, vanc   Order sed rate, ESR Plain imaging MRi in am   Will need orthopedics consult in AM

## 2023-05-05 NOTE — Progress Notes (Signed)
Pharmacy Antibiotic Note  David [name] is a 77 y.o. male admitted on 05/04/2023 with concern for diabetic foot infection. Foot xray with no concern for osteomyelitis. Pharmacy has been consulted for vancomycin dosing. Ceftriaxone and flagyl per MD.   Plan: Vancomycin 2000mg  load > vancomycin 1000mg  q24h (eAUC 503, Scr 2.2) F/u renal function, micro data and narrow as able  Height: 6' (182.9 cm) Weight: 90.7 kg (200 lb) (per pt, thinks he has recently lost ~20 pounds) IBW/kg (Calculated) : 77.6  Temp (24hrs), Avg:98.4 F (36.9 C), Min:97.9 F (36.6 C), Max:98.9 F (37.2 C)  Recent Labs  Lab 05/04/23 2045 05/04/23 2302  WBC 9.3  --   CREATININE 2.20*  --   LATICACIDVEN  --  0.9    Estimated Creatinine Clearance: 31.4 mL/min (A) (by C-G formula based on SCr of 2.2 mg/dL (H)).    No Known Allergies  Antimicrobials this admission: Vancomycin 12/17 > Ceftriaxone 12/17 > Flagyl 12/17 >  Microbiology results: 12/17 MRSA nares: IP  12/17 Ucx: IP   Thank you for allowing pharmacy to be a part of this patient's care.  Marja Kays 05/05/2023 2:15 AM

## 2023-05-05 NOTE — Progress Notes (Signed)
Insomnia: Patient is requesting for Xanax 1 mg that he usually takes at bedtime for sleep.  Trazodone does not help him much.  I am discontinuing trazodone and continuing Xanax 1 mg nightly as needed for sleep.  Continue fall precaution

## 2023-05-05 NOTE — Evaluation (Signed)
Physical Therapy Evaluation Patient Details Name: David [name] MRN: [id number] DOB: [birth year] Today's Date: 05/05/2023  History of Present Illness  77 y.o. male Presented 05/04/23 with shortness of breath and diarrhea. +acute CHF, possible abscess Lt foot and Rt pleural effusion. PMH significant of CAD, diastolic CHF, pulmonary hypertension, and CKD  Clinical Impression   Pt admitted secondary to problem above with deficits below. Per patient, PTA patient was living at home, walking short distances with a quad cane, bumping up the outside and inside steps on his bottom. He reports he could only stand a few seconds before his legs would give out. States there is not enough room for him to use his wheelchair throughout the house. Pt currently only agreed to bed level evaluation. Discussed his limitations with current order for NWB LLE. Patient insists he can still return home, but then acknowledges that he uses his left leg to get up off the floor when at the top of the steps and he would be unable to do this if remains NWB LLE. Discussed likely need for skilled inpatient therapy <3 hours/day and he does not want to go anywhere except home. Do not see home as a safe option if remains LLE NWB.  Anticipate patient will benefit from PT to address problems listed below.Will continue to follow acutely to maximize functional mobility independence and safety.           If plan is discharge home, recommend the following: Two people to help with walking and/or transfers;Assist for transportation;Help with stairs or ramp for entrance   Can travel by private vehicle   No    Equipment Recommendations Hoyer lift  Recommendations for Other Services  OT consult    Functional Status Assessment Patient has had a recent decline in their functional status and demonstrates the ability to make significant improvements in function in a reasonable and predictable amount of time.     Precautions /  Restrictions Precautions Precautions: Fall Restrictions Weight Bearing Restrictions Per Provider Order: Yes LLE Weight Bearing Per Provider Order: Non weight bearing      Mobility  Bed Mobility               General bed mobility comments: pt refuses; "I haven't even been admitted yet!" (pt seen in ED)    Transfers                   General transfer comment: discussed MD has ordered NWB LLE and how this will impact his mobiility and likely need for discharge to SNF; he only wants to go home    Ambulation/Gait                  Stairs            Wheelchair Mobility     Tilt Bed    Modified Rankin (Stroke Patients Only)       Balance                                             Pertinent Vitals/Pain Pain Assessment Pain Assessment: Faces Faces Pain Scale: Hurts little more Pain Location: left heel Pain Descriptors / Indicators: Shooting Pain Intervention(s): Limited activity within patient's tolerance    Home Living Family/patient expects to be discharged to:: Private residence Living Arrangements: Spouse/significant other Available Help at Discharge: Family;Available PRN/intermittently Type of Home:  House Home Access: Stairs to enter Entrance Stairs-Rails: Doctor, general practice of Steps: 3 (reports goes up on his bottom) Alternate Level Stairs-Number of Steps: full flight, typically backs up the stairs on his bottom Home Layout: Two level Home Equipment: Agricultural consultant (2 wheels);Cane - single point;Shower seat;Wheelchair - manual      Prior Function Prior Level of Function : Needs assist             Mobility Comments: Stating now using quad cane in and out of the home. Can only walk short distances and does not use RW because he has to go sideways in the hall. Reports he does exterior and interior stairs on his bottom and able to "muscle himself up" when he gets to the top and has to do floor to  chair transfer. ADLs Comments: per previous record--Spouse assist with community mobility, ADL, iADL, and medication management.     Extremity/Trunk Assessment   Upper Extremity Assessment Upper Extremity Assessment: RUE deficits/detail;LUE deficits/detail;Right hand dominant RUE Deficits / Details: biceps 3+, triceps 4 LUE Deficits / Details: biceps 3+, triceps 3+    Lower Extremity Assessment Lower Extremity Assessment: Generalized weakness;RLE deficits/detail;LLE deficits/detail RLE Deficits / Details: +edema and woody appearance; able to flex knee to ~90 in supine; strength at least 3+ LLE Deficits / Details: +edema and woody appearance; able to flex knee to ~90 in supine; strength at least 3+    Cervical / Trunk Assessment Cervical / Trunk Assessment: Other exceptions Cervical / Trunk Exceptions: overweight  Communication   Communication Communication: No apparent difficulties  Cognition Arousal: Alert Behavior During Therapy: [facility] for tasks assessed/performed                                   General Comments: not fully assessed; provides same home set-up as in chart from 06/2022;        General Comments      Exercises     Assessment/Plan    PT Assessment Patient needs continued PT services  PT Problem List Decreased strength;Decreased activity tolerance;Decreased balance;Decreased mobility;Decreased knowledge of use of DME;Decreased cognition;Decreased knowledge of precautions;Obesity;Pain       PT Treatment Interventions DME instruction;Gait training;Functional mobility training;Stair training;Therapeutic activities;Therapeutic exercise;Cognitive remediation;Patient/family education;Wheelchair mobility training    PT Goals (Current goals can be found in the Care Plan section)  Acute Rehab PT Goals Patient Stated Goal: return home PT Goal Formulation: With patient Time For Goal Achievement: 05/19/23 Potential to Achieve Goals: Fair     Frequency Min 1X/week     Co-evaluation               AM-PAC PT "6 Clicks" Mobility  Outcome Measure Help needed turning from your back to your side while in a flat bed without using bedrails?: A Little Help needed moving from lying on your back to sitting on the side of a flat bed without using bedrails?: A Lot Help needed moving to and from a bed to a chair (including a wheelchair)?: Total Help needed standing up from a chair using your arms (e.g., wheelchair or bedside chair)?: Total Help needed to walk in hospital room?: Total Help needed climbing 3-5 steps with a railing? : Total 6 Click Score: 9    End of Session Equipment Utilized During Treatment: Oxygen Activity Tolerance: Other (comment) (pt refusal to fully participate) Patient left: in bed;with call bell/phone within reach   PT Visit Diagnosis: Muscle  weakness (generalized) (M62.81);Difficulty in walking, not elsewhere classified (R26.2)    Time: 1610-9604 PT Time Calculation (min) (ACUTE ONLY): 18 min   Charges:   PT Evaluation $PT Eval Low Complexity: 1 Low   PT General Charges $$ ACUTE PT VISIT: 1 Visit          Jerolyn Center, PT Acute Rehabilitation Services  Office [phone]   Zena Amos 05/05/2023, 3:42 PM

## 2023-05-05 NOTE — ED Notes (Signed)
Transported to MRI

## 2023-05-05 NOTE — ED Notes (Signed)
Eccho at bedside

## 2023-05-05 NOTE — ED Notes (Signed)
NT to bedside to assist pt with lunch

## 2023-05-05 NOTE — ED Notes (Signed)
Patient moved to hospital bed for comfort 

## 2023-05-05 NOTE — Assessment & Plan Note (Signed)
Check T3/t4

## 2023-05-05 NOTE — Progress Notes (Signed)
PROGRESS NOTE  David [name]:[id number] DOB: 07/12/1945 DOA: 05/04/2023 PCP: Pcp, No   LOS: 0 days   Brief Narrative / Interim history: 77 year old male with history of CAD, chronic diastolic CHF, pulmonary hypertension comes into the hospital with shortness of breath.  Patient is not reliable historian but I was able to speak with his wife, she reports that he has been more more dyspneic over the last several days/1 to 2 weeks.  She reports that he is compliant with all medication except for home torsemide that he does not like to take because he is urinating too much.  There is also reports of worsening lower extremity edema.  He is not a smoker, does not drink.  Vitals are stable in the ER, he was placed on supplemental oxygen but not sure whether he was truly hypoxic or not.  Chest x-ray on admission showed moderate right pleural effusion with atelectasis/pneumonia  Subjective / 24h Interval events: He is doing well this morning, just received Xanax earlier this morning and seems to be a bit confused.  He does not know why he is here, tells me that he wants "a checkup".  Denies any fever or chills.  Denies any chest pain  Assesement and Plan: Principal Problem:   Diastolic CHF (HCC) Active Problems:   Acute on chronic diastolic CHF (congestive heart failure) (HCC)   Essential hypertension   Stage 3b chronic kidney disease (HCC)   Type 2 diabetes mellitus with hyperlipidemia (HCC)   PAD (peripheral artery disease) (HCC)   Coronary artery disease involving native coronary artery of native heart without angina pectoris   Anemia   Mixed hyperlipidemia   Abscess of left foot   Diabetic foot infection (HCC)   Sacral wound   Pleural effusion   Elevated TSH   Principal problem Acute on chronic diastolic CHF, pulmonary hypertension-most recent 2D echo was in February 2024, showing LVEF 50-55%, RV was normal.  He has some evidence of fluid overload with a pleural effusion, lower  extremity edema in the setting of him to be nonadherent to home diuretics. -Has received IV furosemide in the ED, placed on standing, continue for now.  Watch renal function.  Monitor ins and outs, daily weights -Repeat 2D echo pending  Active problems Possible community-acquired pneumonia-CT scan of the chest done on admission showed right lower lobe consolidation/atelectasis with moderate volume right pleural effusion.  There is no loculation noted.  There may be some additional patchy airspace disease at the left lung base versus edema.  Has been empirically placed on ceftriaxone and metronidazole, continue for now -No culture sent unfortunately  Chronic kidney disease stage IIIb -baseline creatinine ranging from 1.8-2.5, currently at baseline.  Watch with diuresis  Anemia of chronic renal disease-hemoglobin stable, no bleeding  Essential hypertension-continue amlodipine  Hyperlipidemia-continue statin  Coronary artery disease -with prior PCI in the past, no chest pain, this appears stable  Concern for left foot infection-MRI pending, vascular ultrasound ABI/TBI pending  DM2, controlled, with hyperglycemia-continue sliding scale  Lab Results  Component Value Date   HGBA1C 6.4 (H) 05/04/2023   CBG (last 3)  Recent Labs    05/05/23 0051 05/05/23 0426 05/05/23 0815  GLUCAP 201* 207* 166*    Scheduled Meds:  amLODipine  10 mg Oral Daily   atorvastatin  40 mg Oral Daily   furosemide  40 mg Intravenous Daily   guaiFENesin  600 mg Oral BID   insulin aspart  0-9 Units Subcutaneous Q4H   isosorbide-hydrALAZINE  2 tablet Oral TID   sodium chloride flush  3 mL Intravenous Q12H   Continuous Infusions:  sodium chloride     cefTRIAXone (ROCEPHIN)  IV     metronidazole Stopped (05/05/23 0233)   [START ON 05/06/2023] vancomycin     PRN Meds:.sodium chloride, acetaminophen **OR** acetaminophen, albuterol, HYDROcodone-acetaminophen, ondansetron **OR** ondansetron (ZOFRAN) IV,  sodium chloride flush  Current Outpatient Medications  Medication Instructions   albuterol (PROVENTIL HFA;VENTOLIN HFA) 108 (90 Base) MCG/ACT inhaler 2 puffs, Inhalation, Every 6 hours PRN   amLODipine (NORVASC) 10 mg, Oral, Daily   aspirin EC 81 mg, Oral, Daily   atorvastatin (LIPITOR) 40 mg, Oral, Daily   ferrous sulfate 325 mg, Oral, Daily with breakfast   HUMALOG MIX 75/25 KWIKPEN (75-25) 100 UNIT/ML Kwikpen 26-36 Units, Subcutaneous, Daily   isosorbide-hydrALAZINE (BIDIL) 20-37.5 MG tablet 2 tablets, Oral, 3 times daily   latanoprost (XALATAN) 0.005 % ophthalmic solution 1 drop, Both Eyes, Daily at bedtime   metoprolol succinate (TOPROL-XL) 100 mg, Oral, Daily, Take with or immediately following a meal.   Multiple Vitamin (MULTIVITAMIN WITH MINERALS) TABS tablet 1 tablet, Oral, Daily   nitroGLYCERIN (NITROSTAT) 0.4 mg, Sublingual, Every 5 min PRN    Diet Orders (From admission, onward)     Start     Ordered   05/05/23 0114  Diet NPO time specified  Diet effective now        05/05/23 0113            DVT prophylaxis: SCDs Start: 05/05/23 0053   Lab Results  Component Value Date   PLT 279 05/05/2023      Code Status: Full Code  Family Communication: d/w patient's wife over the phone  Status is: Observation The patient will require care spanning > 2 midnights and should be moved to inpatient because: IV diuresis, antibiotics   Level of care: Telemetry Cardiac  Consultants:  None  Objective: Vitals:   05/05/23 0630 05/05/23 0651 05/05/23 0800 05/05/23 0830  BP: 115/73  (!) 153/78 (!) 159/80  Pulse:    (!) 58  Resp:  17 14 14   Temp:    97.9 F (36.6 C)  TempSrc:      SpO2: 100%   100%  Weight:      Height:        Intake/Output Summary (Last 24 hours) at 05/05/2023 0927 Last data filed at 05/05/2023 0656 Gross per 24 hour  Intake 601.45 ml  Output 825 ml  Net -223.55 ml   Wt Readings from Last 3 Encounters:  05/05/23 90.7 kg  07/04/22 103.1 kg   06/13/22 104.3 kg    Examination:  Constitutional: NAD Eyes: no scleral icterus ENMT: Mucous membranes are moist.  Neck: normal, supple Respiratory: clear to auscultation bilaterally, no wheezing, no crackles. Cardiovascular: Regular rate and rhythm, no murmurs / rubs / gallops. 1+ LE edema.  Abdomen: non distended, no tenderness. Bowel sounds positive.  Musculoskeletal: no clubbing / cyanosis.    Data Reviewed: I have independently reviewed following labs and imaging studies   CBC Recent Labs  Lab 05/04/23 2045 05/04/23 2317 05/05/23 0732  WBC 9.3  --  8.1  HGB 9.0* 9.5* 8.6*  HCT 29.4* 28.0* 27.4*  PLT 293  --  279  MCV 110.1*  --  110.5*  MCH 33.7  --  34.7*  MCHC 30.6  --  31.4  RDW 14.4  --  14.2  LYMPHSABS 0.7  --   --   MONOABS 0.8  --   --  EOSABS 0.1  --   --   BASOSABS 0.0  --   --     Recent Labs  Lab 05/04/23 2040 05/04/23 2045 05/04/23 2047 05/04/23 2302 05/04/23 2305 05/04/23 2317  NA  --  139  --   --   --  139  K  --  4.2  --   --   --  4.2  CL  --  110  --   --   --   --   CO2  --  20*  --   --   --   --   GLUCOSE  --  206*  --   --   --   --   BUN  --  41*  --   --   --   --   CREATININE  --  2.20*  --   --   --   --   CALCIUM  --  8.7*  --   --   --   --   AST  --  30  --   --   --   --   ALT  --  34  --   --   --   --   ALKPHOS  --  161*  --   --   --   --   BILITOT  --  0.9  --   --   --   --   ALBUMIN  --  2.9*  --   --   --   --   MG  --   --   --   --  2.7*  --   CRP  --   --   --   --  3.4*  --   DDIMER 3.69*  --   --   --   --   --   PROCALCITON  --   --   --   --  <0.10  --   LATICACIDVEN  --   --   --  0.9  --   --   TSH  --   --   --   --  4.587*  --   HGBA1C  --   --   --   --  6.4*  --   BNP  --   --  455.1*  --   --   --     ------------------------------------------------------------------------------------------------------------------ No results for input(s): "CHOL", "HDL", "LDLCALC", "TRIG", "CHOLHDL",  "LDLDIRECT" in the last 72 hours.  Lab Results  Component Value Date   HGBA1C 6.4 (H) 05/04/2023   ------------------------------------------------------------------------------------------------------------------ Recent Labs    05/04/23 2305  TSH 4.587*    Cardiac Enzymes No results for input(s): "CKMB", "TROPONINI", "MYOGLOBIN" in the last 168 hours.  Invalid input(s): "CK" ------------------------------------------------------------------------------------------------------------------    Component Value Date/Time   BNP 455.1 (H) 05/04/2023 2047    CBG: Recent Labs  Lab 05/05/23 0051 05/05/23 0426 05/05/23 0815  GLUCAP 201* 207* 166*    No results found for this or any previous visit (from the past 240 hours).   Radiology Studies: CT CHEST WO CONTRAST Result Date: 05/05/2023 CLINICAL DATA:  Pneumonia, shortness of breath and right pleural effusion. EXAM: CT CHEST WITHOUT CONTRAST TECHNIQUE: Multidetector CT imaging of the chest was performed following the standard protocol without IV contrast. RADIATION DOSE REDUCTION: This exam was performed according to the departmental dose-optimization program which includes automated exposure control, adjustment of the mA and/or kV according to patient size and/or use  of iterative reconstruction technique. COMPARISON:  Chest x-ray yesterday as well as other prior imaging. FINDINGS: Cardiovascular: Carotid, thoracic aortic and extensive coronary atherosclerosis with prior coronary stent placement. Top-normal/mildly enlarged heart. No pericardial fluid identified. Mildly dilated central pulmonary arteries with the main pulmonary artery measuring 3.4 cm. Normal caliber thoracic aorta. Mediastinum/Nodes: Small scattered mediastinal and supraclavicular lymph nodes without visualized enlarged lymph nodes. Normal tracheal patency. Lungs/Pleura: Dense right lower lobe consolidation/atelectasis with associated moderate volume right pleural  effusion. Density of pleural fluid is low and the effusion is not loculated. Minimal/trace pleural fluid on the left. Additional reticulonodular pattern of interstitial thickening in both upper lobes and the lingula may be on the basis of atypical infection, chronic lung disease, heart failure or other interstitial disease. Also suggestion of some potential patchy airspace disease versus atelectasis at the left lung base. Upper Abdomen: Heterogeneous appearance of the liver is nonspecific. Some degree of underlying parenchymal hepatic disease suspected. Correlation suggested with any risk factors for liver disease. Musculoskeletal: No chest wall mass or suspicious bone lesions identified. IMPRESSION: 1. Dense right lower lobe consolidation/atelectasis with associated moderate volume right pleural effusion. Density of pleural fluid is low and the effusion is not loculated. 2. Minimal/trace pleural fluid on the left. 3. Additional reticulonodular pattern of interstitial thickening in both upper lobes and the lingula may be on the basis of atypical infection, chronic lung disease, heart failure or other interstitial disease. Also suggestion of some potential patchy airspace disease versus atelectasis at the left lung base. Follow-up chest CT may be helpful to follow interstitial disease. 4. Heterogeneous appearance of the liver is nonspecific. Some degree of underlying parenchymal hepatic disease suspected. Correlation suggested with any risk factors for liver disease. 5. Carotid, thoracic aortic and extensive coronary atherosclerosis with prior coronary stent placement. 6. Mildly dilated central pulmonary arteries with the main pulmonary artery measuring 3.4 cm. This is suggestive of some degree of underlying pulmonary arterial hypertension. Aortic Atherosclerosis (ICD10-I70.0). Electronically Signed   By: Irish Lack M.D.   On: 05/05/2023 08:54   DG Foot 2 Views Left Result Date: 05/05/2023 CLINICAL DATA:   Swelling and chronic skin changes EXAM: LEFT FOOT - 2 VIEW COMPARISON:  None Available. FINDINGS: Demineralization. Amputation of the fifth toe at the proximal metatarsal diaphysis. No evidence of acute fracture or osteomyelitis. No dislocation. IMPRESSION: No evidence of acute fracture or osteomyelitis. Electronically Signed   By: Minerva Fester M.D.   On: 05/05/2023 02:12   DG Chest 2 View Result Date: 05/04/2023 CLINICAL DATA:  Shortness of breath EXAM: CHEST - 2 VIEW COMPARISON:  06/29/2022 FINDINGS: Stable cardiomegaly. Aortic atherosclerotic calcification. Moderate layering right pleural effusion and associated airspace opacities. The left lung is clear. No pneumothorax. No displaced rib fractures. IMPRESSION: Moderate right pleural effusion and associated atelectasis or pneumonia. Electronically Signed   By: Minerva Fester M.D.   On: 05/04/2023 19:50     Pamella Pert, MD, PhD [facility]  Between 7 am - 7 pm I am available, please contact me via Amion (for emergencies) or Securechat (non urgent messages)  Between 7 pm - 7 am I am not available, please contact night coverage MD/APP via Amion

## 2023-05-05 NOTE — Assessment & Plan Note (Signed)
Ordered Sed rate CRP  Start on rocephin, metronidazole vanc Will need imaging and ABI  Start with plain films and likley will need MRI for AM  Will need orthopedics consult in AM

## 2023-05-05 NOTE — Assessment & Plan Note (Signed)
Continue Lipitor 40 mg po qday

## 2023-05-05 NOTE — Assessment & Plan Note (Signed)
Continue Aspirin 81 mg po q day and lipitor 40 mgpo po q day

## 2023-05-05 NOTE — Assessment & Plan Note (Signed)
 Obtain anemia panel  Transfuse for Hg <7 , rapidly dropping or  if symptomatic

## 2023-05-06 ENCOUNTER — Inpatient Hospital Stay (HOSPITAL_COMMUNITY): Payer: Medicare Other

## 2023-05-06 DIAGNOSIS — E440 Moderate protein-calorie malnutrition: Secondary | ICD-10-CM | POA: Insufficient documentation

## 2023-05-06 DIAGNOSIS — I5033 Acute on chronic diastolic (congestive) heart failure: Secondary | ICD-10-CM | POA: Diagnosis not present

## 2023-05-06 LAB — COMPREHENSIVE METABOLIC PANEL
ALT: 25 U/L (ref 0–44)
AST: 19 U/L (ref 15–41)
Albumin: 2.6 g/dL — ABNORMAL LOW (ref 3.5–5.0)
Alkaline Phosphatase: 136 U/L — ABNORMAL HIGH (ref 38–126)
Anion gap: 8 (ref 5–15)
BUN: 42 mg/dL — ABNORMAL HIGH (ref 8–23)
CO2: 21 mmol/L — ABNORMAL LOW (ref 22–32)
Calcium: 8.3 mg/dL — ABNORMAL LOW (ref 8.9–10.3)
Chloride: 109 mmol/L (ref 98–111)
Creatinine, Ser: 2.45 mg/dL — ABNORMAL HIGH (ref 0.61–1.24)
GFR, Estimated: 27 mL/min — ABNORMAL LOW (ref >60)
Glucose, Bld: 103 mg/dL — ABNORMAL HIGH (ref 70–99)
Potassium: 4.4 mmol/L (ref 3.5–5.1)
Sodium: 138 mmol/L (ref 135–145)
Total Bilirubin: 0.6 mg/dL (ref <1.2)
Total Protein: 6.7 g/dL (ref 6.5–8.1)

## 2023-05-06 LAB — CBC
HCT: 25.6 % — ABNORMAL LOW (ref 39.0–52.0)
Hemoglobin: 7.9 g/dL — ABNORMAL LOW (ref 13.0–17.0)
MCH: 34.1 pg — ABNORMAL HIGH (ref 26.0–34.0)
MCHC: 30.9 g/dL (ref 30.0–36.0)
MCV: 110.3 fL — ABNORMAL HIGH (ref 80.0–100.0)
Platelets: 253 10*3/uL (ref 150–400)
RBC: 2.32 MIL/uL — ABNORMAL LOW (ref 4.22–5.81)
RDW: 14.2 % (ref 11.5–15.5)
WBC: 8 10*3/uL (ref 4.0–10.5)
nRBC: 0 % (ref 0.0–0.2)

## 2023-05-06 LAB — GLUCOSE, CAPILLARY
Glucose-Capillary: 121 mg/dL — ABNORMAL HIGH (ref 70–99)
Glucose-Capillary: 122 mg/dL — ABNORMAL HIGH (ref 70–99)
Glucose-Capillary: 128 mg/dL — ABNORMAL HIGH (ref 70–99)
Glucose-Capillary: 141 mg/dL — ABNORMAL HIGH (ref 70–99)
Glucose-Capillary: 144 mg/dL — ABNORMAL HIGH (ref 70–99)
Glucose-Capillary: 145 mg/dL — ABNORMAL HIGH (ref 70–99)

## 2023-05-06 LAB — T3: T3, Total: 65 ng/dL — ABNORMAL LOW (ref 71–180)

## 2023-05-06 LAB — MAGNESIUM: Magnesium: 2.6 mg/dL — ABNORMAL HIGH (ref 1.7–2.4)

## 2023-05-06 MED ORDER — LORAZEPAM 2 MG/ML IJ SOLN
1.0000 mg | Freq: Once | INTRAMUSCULAR | Status: AC | PRN
  Administered 2023-05-06: 1 mg via INTRAVENOUS
  Filled 2023-05-06: qty 1

## 2023-05-06 MED ORDER — ENOXAPARIN SODIUM 30 MG/0.3ML IJ SOSY
30.0000 mg | PREFILLED_SYRINGE | Freq: Every day | INTRAMUSCULAR | Status: DC
  Administered 2023-05-06 – 2023-05-10 (×5): 30 mg via SUBCUTANEOUS
  Filled 2023-05-06 (×5): qty 0.3

## 2023-05-06 MED ORDER — ENSURE ENLIVE PO LIQD
237.0000 mL | Freq: Two times a day (BID) | ORAL | Status: DC
  Administered 2023-05-06 – 2023-05-10 (×5): 237 mL via ORAL

## 2023-05-06 MED ORDER — ALPRAZOLAM 0.5 MG PO TABS
0.5000 mg | ORAL_TABLET | Freq: Once | ORAL | Status: AC
  Administered 2023-05-06: 0.5 mg via ORAL
  Filled 2023-05-06: qty 1

## 2023-05-06 NOTE — Progress Notes (Signed)
PROGRESS NOTE  David [name] ION:[id number] DOB: 06-06-1945 DOA: 05/04/2023 PCP: Renford Dills, MD   LOS: 1 day   Brief Narrative / Interim history: 77 year old male with history of CAD, chronic diastolic CHF, pulmonary hypertension comes into the hospital with shortness of breath.  Patient is not reliable historian but I was able to speak with his wife, she reports that he has been more more dyspneic over the last several days/1 to 2 weeks.  She reports that he is compliant with all medication except for home torsemide that he does not like to take because he is urinating too much.  There is also reports of worsening lower extremity edema.  He is not a smoker, does not drink.  Vitals are stable in the ER, he was placed on supplemental oxygen but not sure whether he was truly hypoxic or not.  Chest x-ray on admission showed moderate right pleural effusion with atelectasis/pneumonia  Subjective / 24h Interval events: Much more alert today.  Complains of shortness of breath when laying flat.  Assesement and Plan: Principal Problem:   Diastolic CHF (HCC) Active Problems:   Acute on chronic diastolic CHF (congestive heart failure) (HCC)   Essential hypertension   Stage 3b chronic kidney disease (HCC)   Type 2 diabetes mellitus with hyperlipidemia (HCC)   PAD (peripheral artery disease) (HCC)   Coronary artery disease involving native coronary artery of native heart without angina pectoris   Anemia   Mixed hyperlipidemia   Abscess of left foot   Diabetic foot infection (HCC)   Sacral wound   Pleural effusion   Elevated TSH   Acute on chronic diastolic (congestive) heart failure (HCC)   Principal problem Acute on chronic diastolic CHF, pulmonary hypertension-most recent 2D echo was in February 2024, showing LVEF 50-55%, RV was normal.  He has some evidence of fluid overload with a pleural effusion, lower extremity edema in the setting of him to be nonadherent to home diuretics. -Has  received IV furosemide in the ED, hold this morning due to slight creatinine bump -Repeat 2D echo showed LVEF 40-45%, mild concentric LVH, indeterminate diastolic parameters and RV function was mildly reduced.  He has mildly elevated PA pressure, improved however from 70 mmHg at the beginning of this year to 51.  Active problems Possible community-acquired pneumonia-CT scan of the chest done on admission showed right lower lobe consolidation/atelectasis with moderate volume right pleural effusion.  There is no loculation noted.  There may be some additional patchy airspace disease at the left lung base versus edema.  Has been empirically placed on ceftriaxone and metronidazole, continue for now -No culture sent unfortunately, will repeat chest x-ray tomorrow after more Lasix  Chronic kidney disease stage IIIb -baseline creatinine ranging from 1.8-2.5, currently close to baseline.   -Hold Lasix this morning, resume this afternoon when he is back from the MRI  Anemia of chronic renal disease-hemoglobin stable, no bleeding  Essential hypertension-continue amlodipine, blood pressure overall stable  Hyperlipidemia-continue statin  Coronary artery disease -with prior PCI in the past, no chest pain, this appears stable  Concern for left foot infection-MRI pending, vascular ultrasound ABI/TBI pending  DM2, controlled, with hyperglycemia-continue sliding scale  Lab Results  Component Value Date   HGBA1C 6.4 (H) 05/04/2023   CBG (last 3)  Recent Labs    05/05/23 2024 05/06/23 0013 05/06/23 0357  GLUCAP 174* 122* 121*    Scheduled Meds:  amLODipine  10 mg Oral Daily   atorvastatin  40 mg Oral Daily  Gerhardt's butt cream   Topical BID   guaiFENesin  600 mg Oral BID   hydrocerin   Topical Daily   influenza vaccine adjuvanted  0.5 mL Intramuscular Tomorrow-1000   insulin aspart  0-9 Units Subcutaneous Q4H   isosorbide-hydrALAZINE  2 tablet Oral TID   pneumococcal 20-valent conjugate  vaccine  0.5 mL Intramuscular Tomorrow-1000   sodium chloride flush  3 mL Intravenous Q12H   Continuous Infusions:  cefTRIAXone (ROCEPHIN)  IV 2 g (05/05/23 2135)   metronidazole 500 mg (05/06/23 0043)   vancomycin 1,000 mg (05/06/23 0312)   PRN Meds:.acetaminophen **OR** acetaminophen, albuterol, HYDROcodone-acetaminophen, ondansetron **OR** ondansetron (ZOFRAN) IV, sodium chloride flush  Current Outpatient Medications  Medication Instructions   albuterol (PROVENTIL HFA;VENTOLIN HFA) 108 (90 Base) MCG/ACT inhaler 2 puffs, Inhalation, Every 6 hours PRN   amLODipine (NORVASC) 10 mg, Oral, Daily   aspirin EC 81 mg, Oral, Daily   atorvastatin (LIPITOR) 40 mg, Oral, Daily   ferrous sulfate 325 mg, Oral, Daily with breakfast   HUMALOG MIX 75/25 KWIKPEN (75-25) 100 UNIT/ML Kwikpen 26-36 Units, Subcutaneous, Daily   isosorbide-hydrALAZINE (BIDIL) 20-37.5 MG tablet 2 tablets, Oral, 3 times daily   latanoprost (XALATAN) 0.005 % ophthalmic solution 1 drop, Both Eyes, Daily at bedtime   metoprolol succinate (TOPROL-XL) 100 mg, Oral, Daily, Take with or immediately following a meal.   Multiple Vitamin (MULTIVITAMIN WITH MINERALS) TABS tablet 1 tablet, Oral, Daily   nitroGLYCERIN (NITROSTAT) 0.4 mg, Sublingual, Every 5 min PRN    Diet Orders (From admission, onward)     Start     Ordered   05/05/23 0956  Diet 2 gram sodium Room service appropriate? Yes; Fluid consistency: Thin; Fluid restriction: 1500 mL Fluid  Diet effective now       Question Answer Comment  Room service appropriate? Yes   Fluid consistency: Thin   Fluid restriction: 1500 mL Fluid      05/05/23 0955            DVT prophylaxis: SCDs Start: 05/05/23 0053   Lab Results  Component Value Date   PLT 253 05/06/2023      Code Status: Full Code  Family Communication: d/w patient's wife over the phone  Status is: Inpatient   Level of care: Telemetry Cardiac  Consultants:  None  Objective: Vitals:   05/06/23  0009 05/06/23 0309 05/06/23 0354 05/06/23 0730  BP: 120/67  133/75 (!) 153/87  Pulse: 76  (!) 50 60  Resp: 17  17 18   Temp: 97.7 F (36.5 C)  98.1 F (36.7 C) 97.8 F (36.6 C)  TempSrc: Oral  Oral Oral  SpO2: 97%  98% 98%  Weight:  96.1 kg    Height:        Intake/Output Summary (Last 24 hours) at 05/06/2023 0951 Last data filed at 05/06/2023 0843 Gross per 24 hour  Intake 570.66 ml  Output 1210 ml  Net -639.34 ml   Wt Readings from Last 3 Encounters:  05/06/23 96.1 kg  07/04/22 103.1 kg  06/13/22 104.3 kg    Examination:  Constitutional: NAD Eyes: lids and conjunctivae normal, no scleral icterus ENMT: mmm Neck: normal, supple Respiratory: Bilateral rhonchi, no wheezing Cardiovascular: Regular rate and rhythm, no murmurs / rubs / gallops. No LE edema. Abdomen: soft, no distention, no tenderness. Bowel sounds positive.   Data Reviewed: I have independently reviewed following labs and imaging studies   CBC Recent Labs  Lab 05/04/23 2045 05/04/23 2317 05/05/23 0732 05/06/23 0251  WBC 9.3  --  8.1 8.0  HGB 9.0* 9.5* 8.6* 7.9*  HCT 29.4* 28.0* 27.4* 25.6*  PLT 293  --  279 253  MCV 110.1*  --  110.5* 110.3*  MCH 33.7  --  34.7* 34.1*  MCHC 30.6  --  31.4 30.9  RDW 14.4  --  14.2 14.2  LYMPHSABS 0.7  --   --   --   MONOABS 0.8  --   --   --   EOSABS 0.1  --   --   --   BASOSABS 0.0  --   --   --     Recent Labs  Lab 05/04/23 2040 05/04/23 2045 05/04/23 2047 05/04/23 2302 05/04/23 2305 05/04/23 2317 05/05/23 0732 05/06/23 0251  NA  --  139  --   --   --  139 135 138  K  --  4.2  --   --   --  4.2 4.1 4.4  CL  --  110  --   --   --   --  107 109  CO2  --  20*  --   --   --   --  23 21*  GLUCOSE  --  206*  --   --   --   --  179* 103*  BUN  --  41*  --   --   --   --  40* 42*  CREATININE  --  2.20*  --   --   --   --  1.93* 2.45*  CALCIUM  --  8.7*  --   --   --   --  8.4* 8.3*  AST  --  30  --   --   --   --  23 19  ALT  --  34  --   --   --   --   29 25  ALKPHOS  --  161*  --   --   --   --  143* 136*  BILITOT  --  0.9  --   --   --   --  1.1 0.6  ALBUMIN  --  2.9*  --   --   --   --  2.8* 2.6*  MG  --   --   --   --  2.7*  --  2.5* 2.6*  CRP  --   --   --   --  3.4*  --   --   --   DDIMER 3.69*  --   --   --   --   --   --   --   PROCALCITON  --   --   --   --  <0.10  --   --   --   LATICACIDVEN  --   --   --  0.9  --   --   --   --   TSH  --   --   --   --  4.587*  --   --   --   HGBA1C  --   --   --   --  6.4*  --   --   --   BNP  --   --  455.1*  --   --   --   --   --     ------------------------------------------------------------------------------------------------------------------ No results for input(s): "CHOL", "HDL", "LDLCALC", "TRIG", "CHOLHDL", "LDLDIRECT" in the last 72 hours.  Lab Results  Component Value Date   HGBA1C 6.4 (  H) 05/04/2023   ------------------------------------------------------------------------------------------------------------------ Recent Labs    05/04/23 2305  TSH 4.587*    Cardiac Enzymes No results for input(s): "CKMB", "TROPONINI", "MYOGLOBIN" in the last 168 hours.  Invalid input(s): "CK" ------------------------------------------------------------------------------------------------------------------    Component Value Date/Time   BNP 455.1 (H) 05/04/2023 2047    CBG: Recent Labs  Lab 05/05/23 1146 05/05/23 1548 05/05/23 2024 05/06/23 0013 05/06/23 0357  GLUCAP 117* 168* 174* 122* 121*    No results found for this or any previous visit (from the past 240 hours).   Radiology Studies: VAS [place] ABI WITH/WO TBI Result Date: 05/05/2023  LOWER EXTREMITY DOPPLER STUDY Patient Name:  David [name]  Date of Exam:   05/05/2023 Medical Rec #: [id number]         Accession #:    [id number] Date of Birth: 05/06/1946        Patient Gender: M Patient Age:   40 years Exam Location:  [facility] Procedure:      VAS [place] ABI WITH/WO TBI Referring Phys: Jonny Ruiz DOUTOVA  --------------------------------------------------------------------------------  Indications: Claudication. High Risk Factors: Hypertension, coronary artery disease.  Comparison Study: No prior exam. Performing Technologist: Fernande Bras  Examination Guidelines: A complete evaluation includes at minimum, Doppler waveform signals and systolic blood pressure reading at the level of bilateral brachial, anterior tibial, and posterior tibial arteries, when vessel segments are accessible. Bilateral testing is considered an integral part of a complete examination. Photoelectric Plethysmograph (PPG) waveforms and toe systolic pressure readings are included as required and additional duplex testing as needed. Limited examinations for reoccurring indications may be performed as noted.  ABI Findings: +--------+------------------+-----+----------+--------+ Right   Rt Pressure (mmHg)IndexWaveform  Comment  +--------+------------------+-----+----------+--------+ [id number]                    triphasic          +--------+------------------+-----+----------+--------+ DP      157               1.08 monophasic         +--------+------------------+-----+----------+--------+ +---------+------------------+-----+----------+-------+ Left     Lt Pressure (mmHg)IndexWaveform  Comment +---------+------------------+-----+----------+-------+ Brachial 139                    triphasic         +---------+------------------+-----+----------+-------+ PTA      91                0.63 monophasic        +---------+------------------+-----+----------+-------+ DP       88                0.61 monophasic        +---------+------------------+-----+----------+-------+ Great Toe126               0.87 Abnormal          +---------+------------------+-----+----------+-------+ Right great to amputation, subsequent digits curled in and unable to elongate to be evaluated.  Summary: Right: Resting right  ankle-brachial index is within normal range. Left: Resting left ankle-brachial index indicates moderate left lower extremity arterial disease. The left toe-brachial index is normal. *See table(s) above for measurements and observations.  Electronically signed by Lemar Livings MD on 05/05/2023 at 6:55:31 PM.    Final    ECHOCARDIOGRAM COMPLETE Result Date: 05/05/2023    ECHOCARDIOGRAM REPORT   Patient Name:   David [name] Date of Exam: 05/05/2023 Medical Rec #:  [id number]        Height:  72.0 in Accession #:    [id number]       Weight:       200.0 lb Date of Birth:  [birth year]       BSA:          2.131 m Patient Age:    77 years         BP:           154/69 mmHg Patient Gender: M                HR:           49 bpm. Exam Location:  Inpatient Procedure: 2D Echo, Cardiac Doppler and Color Doppler Indications:    CHF  History:        Patient has prior history of Echocardiogram examinations, most                 recent 06/30/2022. CHF, CAD; Risk Factors:Dyslipidemia, Diabetes                 and Hypertension.  Sonographer:    Melissa Morford RDCS (AE, PE) Referring Phys: [id number] ANASTASSIA DOUTOVA IMPRESSIONS  1. Left ventricular ejection fraction, by estimation, is 40 to 45%. The left ventricle has mildly decreased function. The left ventricle has no regional wall motion abnormalities. There is mild concentric left ventricular hypertrophy. Left ventricular diastolic parameters are indeterminate.  2. Right ventricular systolic function is moderately reduced. The right ventricular size is mildly enlarged. There is moderately elevated pulmonary artery systolic pressure. The estimated right ventricular systolic pressure is 51.0 mmHg.  3. Left atrial size was mild to moderately dilated.  4. Right atrial size was moderately dilated.  5. The mitral valve is normal in structure. No evidence of mitral valve regurgitation. No evidence of mitral stenosis.  6. Tricuspid valve regurgitation is mild to moderate.  7. The  aortic valve is tricuspid. There is mild calcification of the aortic valve. Aortic valve regurgitation is not visualized. No aortic stenosis is present.  8. Aortic dilatation noted. There is borderline dilatation of the ascending aorta, measuring 38 mm.  9. The inferior vena cava is dilated in size with <50% respiratory variability, suggesting right atrial pressure of 15 mmHg. FINDINGS  Left Ventricle: Left ventricular ejection fraction, by estimation, is 40 to 45%. The left ventricle has mildly decreased function. The left ventricle has no regional wall motion abnormalities. Definity contrast agent was given IV to delineate the left ventricular endocardial borders. The left ventricular internal cavity size was normal in size. There is mild concentric left ventricular hypertrophy. Left ventricular diastolic parameters are indeterminate. Right Ventricle: The right ventricular size is mildly enlarged. No increase in right ventricular wall thickness. Right ventricular systolic function is moderately reduced. There is moderately elevated pulmonary artery systolic pressure. The tricuspid regurgitant velocity is 3.00 m/s, and with an assumed right atrial pressure of 15 mmHg, the estimated right ventricular systolic pressure is 51.0 mmHg. Left Atrium: Left atrial size was mild to moderately dilated. Right Atrium: Right atrial size was moderately dilated. Pericardium: There is no evidence of pericardial effusion. Mitral Valve: The mitral valve is normal in structure. No evidence of mitral valve regurgitation. No evidence of mitral valve stenosis. Tricuspid Valve: The tricuspid valve is normal in structure. Tricuspid valve regurgitation is mild to moderate. No evidence of tricuspid stenosis. Aortic Valve: The aortic valve is tricuspid. There is mild calcification of the aortic valve. Aortic valve regurgitation is not visualized. No aortic stenosis is present. Pulmonic Valve:  The pulmonic valve was normal in structure.  Pulmonic valve regurgitation is trivial. No evidence of pulmonic stenosis. Aorta: Aortic dilatation noted. There is borderline dilatation of the ascending aorta, measuring 38 mm. Venous: The inferior vena cava is dilated in size with less than 50% respiratory variability, suggesting right atrial pressure of 15 mmHg. IAS/Shunts: No atrial level shunt detected by color flow Doppler.  LEFT VENTRICLE PLAX 2D LVIDd:         5.20 cm   Diastology LVIDs:         4.80 cm   LV e' medial:    6.84 cm/s LV PW:         1.00 cm   LV E/e' medial:  8.8 LV IVS:        1.10 cm   LV e' lateral:   6.99 cm/s LVOT diam:     2.20 cm   LV E/e' lateral: 8.6 LV SV:         76 LV SV Index:   36 LVOT Area:     3.80 cm  RIGHT VENTRICLE RV S prime:     8.08 cm/s TAPSE (M-mode): 1.7 cm LEFT ATRIUM              Index        RIGHT ATRIUM           Index LA diam:        3.80 cm  1.78 cm/m   RA Area:     19.90 cm LA Vol (A2C):   101.0 ml 47.41 ml/m  RA Volume:   58.20 ml  27.32 ml/m LA Vol (A4C):   96.6 ml  45.34 ml/m LA Biplane Vol: 102.0 ml 47.87 ml/m  AORTIC VALVE LVOT Vmax:   81.10 cm/s LVOT Vmean:  57.400 cm/s LVOT VTI:    0.200 m  AORTA Ao Root diam: 3.20 cm Ao Asc diam:  3.95 cm MITRAL VALVE               TRICUSPID VALVE MV Area (PHT): 3.11 cm    TR Peak grad:   36.0 mmHg MV Decel Time: 244 msec    TR Vmax:        300.00 cm/s MV E velocity: 60.20 cm/s MV A velocity: 27.20 cm/s  SHUNTS MV E/A ratio:  2.21        Systemic VTI:  0.20 m                            Systemic Diam: 2.20 cm Arvilla Meres MD Electronically signed by Arvilla Meres MD Signature Date/Time: 05/05/2023/11:27:07 AM    Final      Pamella Pert, MD, PhD [facility]  Between 7 am - 7 pm I am available, please contact me via Amion (for emergencies) or Securechat (non urgent messages)  Between 7 pm - 7 am I am not available, please contact night coverage MD/APP via Amion

## 2023-05-06 NOTE — Progress Notes (Signed)
OT Cancellation Note  Patient Details Name: DREVAN ASBILL MRN: [id number] DOB: 1945/05/26   Cancelled Treatment:    Reason Eval/Treat Not Completed: Patient at procedure or test/ unavailable (At MRI; will return as schedile allows)  Tyler Deis, OTR/L [facility] Office: [phone]   Myrla Halsted 05/06/2023, 9:18 AM

## 2023-05-06 NOTE — TOC Progression Note (Addendum)
Transition of Care [facility]) - Progression Note    Patient Details  Name: David [name] MRN: [id number] Date of Birth: 1946-02-24  [facility]) CM/SW Contact  Michaela Corner, [place] Phone Number: 05/06/2023, 10:35 AM  Clinical Narrative:   CSW spoke w/ pts spouse, [name], about PT recs for SNF. [name] states that pt will not be agreeable but to talk w/ him about SNF.   12:19PM: CSW spoke with pt about PT recs for SNF. Pt declined SNF at this time, CSW asked pt if referrals could be sent just so he could look at bed options - Pt stated no. CSW asked pt about supports at home, pt stated he lives with his wife and he would like to look into [facility]. CSW notified NCM.          Expected Discharge Plan and Services                                               Social Determinants of Health (SDOH) Interventions SDOH Screenings   Food Insecurity: No Food Insecurity (05/05/2023)  Housing: Low Risk  (05/05/2023)  Transportation Needs: No Transportation Needs (05/05/2023)  Recent Concern: Transportation Needs - Unmet Transportation Needs (05/05/2023)  Utilities: Not At Risk (05/05/2023)  Financial Resource Strain: Low Risk  (08/20/2017)  Physical Activity: Inactive (08/20/2017)  Social Connections: Socially Integrated (12/19/2017)  Stress: No Stress Concern Present (08/20/2017)  Tobacco Use: Medium Risk (05/05/2023)    Readmission Risk Interventions     No data to display

## 2023-05-06 NOTE — Progress Notes (Signed)
Initial Nutrition Assessment  DOCUMENTATION CODES:  Non-severe (moderate) malnutrition in context of chronic illness  INTERVENTION:  - Ensure Enlive BID between meals (prefers Vanilla and at room temp) -HS snack  -Discussed menu ordering process with patient and encouraged protein intake at all meals; discussed importance of adequate nutrition for wound healing -Recommend assistance with meal set up and cutting of food, as needed - entered care order   NUTRITION DIAGNOSIS:  Moderate Malnutrition related to chronic illness as evidenced by energy intake < 75% for > or equal to 1 month, severe muscle depletion, moderate muscle depletion.  GOAL:  Patient will meet greater than or equal to 90% of their needs    MONITOR:  PO intake, Weight trends, Supplement acceptance  REASON FOR ASSESSMENT:  Consult Wound healing  ASSESSMENT:  Patient admitted for worsening SOB and diarrhea over last few weeks. Wife endorses not taking torsemide as ordered with generalized weakness that has been progressing over the same time period with fluid build up. Found to have R pleural effusion w/ atelectasis/PNA, infected diabetic foot ulcer, and sacral wound on admission. PMH: CHF, pulmonary HTN, T2DM, GERD, arthritis, CKD III, PAD, CAD w/ stent placement 11/18.  Current intake x1 meal is 120%, per RN note. Patient endorses that he eats 1-2 meals per day. Wife works, so she will sometimes make him breakfast before she leaves. They always eat supper. He reports he becomes fatigued with meals when requiring extensive set up such as cutting meat due to his diabetic neuropathy and poor eyesight. Sometimes patient's wife does have to feed him. No issues chewing/swallowing.   24 Hr Recall: B: mandarin oranges, grits, apple sauce, english muffin, eggs L: Skips D: greens, sweet potato, no protein    Current Weight: 96.1kg Admission Weight: 90.7kg accurate?  Questionable accuracy of admission weight. UBW prior to  admission was over 200# >6 months ago, per patient at bedside. However wife endorsed he has shown significant fluid gains over last few weeks, in Hospitalist note on 12/17. This is  2/2  reported noncompliance with torsemide at home. Holding diuretics today due to bump in creatinine.   Currently lives at home and would prefer to return, but with significant ambulatory issues there. Currently NWB LLE, which brings up concerns with safety to return home. Patient reports he sits for most of the day while his wife is at work and has urinal at chairside. Only ambulates to get up to go to the bathroom, but tries to avoid doing so. Does endorse hunger between meals.  Per WOC note, 12/17, moisture associated skin damage to sacral area as well as some dry crusted loose nonviable skin to bilateral legs .Pt is incontinent.  12/18 - ABX initiated  12/17 - Ad[facility] to [facility] 12/16 - Arrived at ED via EMS from home  Labs: CBGs 121- 207 Hgb A1c 6.4 CRP 3.4 eGFR 27 <- 35  Meds: insulin SSI, amlodipine, hydralazine, ceftriaxone, metronidazole, vancomycin, hydrocodone-acetaminophen, lorazepam   NUTRITION - FOCUSED PHYSICAL EXAM:  Flowsheet Row Most Recent Value  Orbital Region No depletion  Upper Arm Region No depletion  Thoracic and Lumbar Region Moderate depletion  Buccal Region Mild depletion  Temple Region No depletion  Clavicle Bone Region Mild depletion  Clavicle and Acromion Bone Region Mild depletion  Scapular Bone Region Mild depletion  Dorsal Hand Severe depletion  Patellar Region --  [L severe, R mild]  Anterior Thigh Region --  [L severe,  R mild]  Posterior Calf Region Unable to assess  Edema (RD Assessment) Unable to assess  [unable to assess BLE]  Hair Reviewed  Eyes Reviewed  Mouth Reviewed  Skin Reviewed  [dry and flaky, see WOC RN note]  Nails Reviewed      Diet Order:   Diet Order             Diet 2 gram sodium Room service appropriate? Yes; Fluid consistency: Thin; Fluid  restriction: 1500 mL Fluid  Diet effective now                  EDUCATION NEEDS:   Education needs have been addressed  Skin:  Skin Assessment: Skin Integrity Issues: (See WOC note) Skin Integrity Issues:: Stage II Stage II: sacrum - per bedside RN (per WOC RN only MASD).     Last BM:  Per RN report, 12/16 was last BM  Height:  Ht Readings from Last 1 Encounters:  05/05/23 6\' 1"  (1.854 m)    Weight:  Wt Readings from Last 1 Encounters:  05/06/23 96.1 kg    Ideal Body Weight:  84 kg  BMI:  Body mass index is 27.95 kg/m.  Estimated Nutritional Needs:   Kcal:  2100-2300 kcals  Protein:  95-105g  Fluid:  <1.5L   Myrtie Cruise MS, RD, LDN Registered Dietitian Clinical Nutrition RD Inpatient Contact Info in Amion

## 2023-05-06 NOTE — Evaluation (Signed)
Occupational Therapy Evaluation Patient Details Name: DEMAREO DAVISON MRN: [id number] DOB: 04/06/1946 Today's Date: 05/06/2023   History of Present Illness 77 y.o. male Presented 05/04/23 with shortness of breath and diarrhea. +acute CHF, possible abscess Lt foot and Rt pleural effusion. PMH significant of CAD, diastolic CHF, pulmonary hypertension, and CKD   Clinical Impression   PTA, pt lived at home with wife who per chart assisted with ADL, IADL, medication management, and community mobility. Per pt report, he was previously independent in ADL. Upon eval, pt with generalized weakness, max confused perseverating on waking up in a hospital and stating he did not know what he was here for and doesn't know why. Intermittently needing max cues to follow basic commands. Attempted STS transfer today with total A +2 and deferred after 3 attempts with pt constantly attempting to weightbear through LLE despite education regarding orders. Able to lateral scoot with mod A and +2 present for safety. Per pt his halls are narrow at home, his chair will not fit, and he has to use cane or side step with RW. Do not believe this is a safe option with weightbearing restrictions. Will continue to follow. Patient will benefit from continued inpatient follow up therapy, <3 hours/day       If plan is discharge home, recommend the following: Two people to help with walking and/or transfers;Two people to help with bathing/dressing/bathroom;Assistance with cooking/housework;Help with stairs or ramp for entrance;Assist for transportation    Functional Status Assessment  Patient has had a recent decline in their functional status and demonstrates the ability to make significant improvements in function in a reasonable and predictable amount of time.  Equipment Recommendations  Other (comment) (defer; if home, drop arm BSC)    Recommendations for Other Services       Precautions / Restrictions  Precautions Precautions: Fall Restrictions Weight Bearing Restrictions Per Provider Order: Yes LLE Weight Bearing Per Provider Order: Non weight bearing      Mobility Bed Mobility Overal bed mobility: Needs Assistance Bed Mobility: Supine to Sit, Sit to Supine     Supine to sit: Min assist Sit to supine: Min assist   General bed mobility comments: for truncal elevation and then to bring BLE back into bed    Transfers Overall transfer level: Needs assistance Equipment used: Rolling walker (2 wheels), None Transfers: Sit to/from Stand, Bed to chair/wheelchair/BSC Sit to Stand: Total assist, +2 physical assistance, +2 safety/equipment, From elevated surface (initiated with total A +2 but constantly having to stop attempt due to pt not adherent to weightbearing precautions)          Lateral/Scoot Transfers: Mod assist, +2 safety/equipment General transfer comment: Mod A for LLE management to scoot up toward [facility] with multiple scoots. +2 for safety. Unable to come to full stand today due to LE weakness and pt unable to be redirected to maintain weightbearing precautions      Balance Overall balance assessment: Needs assistance Sitting-balance support: No upper extremity supported, Feet supported Sitting balance-Leahy Scale: Good     Standing balance support: Bilateral upper extremity supported Standing balance-Leahy Scale: Zero Standing balance comment: cannot stand and maintain weightbearing precautions even with +2 A this session                           ADL either performed or assessed with clinical judgement   ADL Overall ADL's : Needs assistance/impaired Eating/Feeding: Set up;Sitting   Grooming: Set up;Sitting  Upper Body Bathing: Set up;Sitting   Lower Body Bathing: Maximal assistance;Sitting/lateral leans   Upper Body Dressing : Set up;Sitting   Lower Body Dressing: Total assistance Lower Body Dressing Details (indicate cue type and reason): don  socks Toilet Transfer: +2 for safety/equipment;Moderate assistance Toilet Transfer Details (indicate cue type and reason): lateral scoot                 Vision Ability to See in Adequate Light: 2 Moderately impaired Patient Visual Report: No change from baseline Additional Comments: 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     Perception         Praxis         Pertinent Vitals/Pain Pain Assessment Pain Assessment: Faces Faces Pain Scale: No hurt     Extremity/Trunk Assessment Upper Extremity Assessment Upper Extremity Assessment: RUE deficits/detail;LUE deficits/detail RUE Deficits / Details: biceps 3+, triceps 4 LUE Deficits / Details: biceps 3+, triceps 3+   Lower Extremity Assessment Lower Extremity Assessment: Defer to PT evaluation   Cervical / Trunk Assessment Cervical / Trunk Assessment: Other exceptions Cervical / Trunk Exceptions: overweight   Communication Communication Communication: No apparent difficulties   Cognition Arousal: Alert Behavior During Therapy: WFL for tasks assessed/performed Overall Cognitive Status: No family/caregiver present to determine baseline cognitive functioning                                 General Comments: Pt not oriented to place or situation on entry and with limited carryover of situation constantly stating "I just woke up and I was here but I expected to be at home in my bed". needing max multimodal cues and asisst to maintain NWB on LLE. Used OT foot as cue not to put weight on foot and pt repositioning attempting to initiate standing pushing weight down into therapist foot and could not be redirected despite ability to state his precautions back to OT and knowing he could "only stand on his RLE".     General Comments       Exercises     Shoulder Instructions      Home Living Family/patient expects to be discharged to:: Private residence Living Arrangements: Spouse/significant  other Available Help at Discharge: Family;Available PRN/intermittently Type of Home: House Home Access: Stairs to enter [employer] of Steps: 3 (pt reports he goes up on his bottom) Entrance Stairs-Rails: Right;Left Home Layout: Two level Alternate Level Stairs-Number of Steps: full flight, typically backs up the stairs on his bottom Alternate Level Stairs-Rails: Right Bathroom Shower/Tub: Chief Strategy Officer: Standard     Home Equipment: Agricultural consultant (2 wheels);Cane - single point;Shower seat;Wheelchair - manual          Prior Functioning/Environment Prior Level of Function : Needs assist             Mobility Comments: Stating now using quad cane in and out of the home. Can only walk short distances and does not use RW because he has to go sideways in the hall. Reports he does exterior and interior stairs on his bottom and able to "muscle himself up" when he gets to the top and has to do floor to chair transfer. ADLs Comments: per previous record--Spouse assist with community mobility, ADL, iADL, and medication management. Per pt today, wife assists with IADL        OT Problem List: Decreased strength;Decreased activity tolerance;Impaired balance (sitting and/or standing);Decreased  safety awareness;Cardiopulmonary status limiting activity;Decreased knowledge of use of DME or AE;Decreased cognition      OT Treatment/Interventions: Self-care/ADL training;DME and/or AE instruction;Therapeutic exercise;Balance training;Patient/family education;Therapeutic activities    OT Goals(Current goals can be found in the care plan section) Acute Rehab OT Goals Patient Stated Goal: go home OT Goal Formulation: With patient Time For Goal Achievement: 05/20/23 Potential to Achieve Goals: Good  OT Frequency: Min 1X/week    Co-evaluation              AM-PAC OT "6 Clicks" Daily Activity     Outcome Measure Help from another person eating meals?:  None Help from another person taking care of personal grooming?: A Little Help from another person toileting, which includes using toliet, bedpan, or urinal?: Total Help from another person bathing (including washing, rinsing, drying)?: A Lot Help from another person to put on and taking off regular upper body clothing?: A Little Help from another person to put on and taking off regular lower body clothing?: Total 6 Click Score: 14   End of Session Equipment Utilized During Treatment: Gait belt;Rolling walker (2 wheels) Nurse Communication: Mobility status;Weight bearing status;Precautions (concerns from OT about home with weightbearing precautions in home not accessible)  Activity Tolerance: Patient tolerated treatment well Patient left: in bed;with call bell/phone within reach;with bed alarm set  OT Visit Diagnosis: Unsteadiness on feet (R26.81);Muscle weakness (generalized) (M62.81);Other symptoms and signs involving cognitive function                Time: 8295-6213 OT Time Calculation (min): 32 min Charges:  OT General Charges $OT Visit: 1 Visit OT Evaluation $OT Eval Moderate Complexity: 1 Mod OT Treatments $Self Care/Home Management : 8-22 mins  Tyler Deis, OTR/L [facility] Acute Rehabilitation Office: [phone]   Myrla Halsted 05/06/2023, 5:12 PM

## 2023-05-06 NOTE — TOC Progression Note (Signed)
Transition of Care [facility]) - Progression Note    Patient Details  Name: David [name] MRN: [id number] Date of Birth: [birth year]  Transition of Care [facility]) CM/SW Contact  Leone Haven, RN Phone Number: 05/06/2023, 12:39 PM  Clinical Narrative:    NCM notified by CSW that patient declined SNF and wants HH, NCM offered choice, he does not have a preference,  NCM made referral to [facility] with [facility], she is able to take referral.  Soc will begin 24 to 48 hrs post dc.  He will need ambulance transport at dc.          Expected Discharge Plan and Services                                               Social Determinants of Health (SDOH) Interventions SDOH Screenings   Food Insecurity: No Food Insecurity (05/05/2023)  Housing: Low Risk  (05/05/2023)  Transportation Needs: No Transportation Needs (05/05/2023)  Recent Concern: Transportation Needs - Unmet Transportation Needs (05/05/2023)  Utilities: Not At Risk (05/05/2023)  Financial Resource Strain: Low Risk  (08/20/2017)  Physical Activity: Inactive (08/20/2017)  Social Connections: Socially Integrated (12/19/2017)  Stress: No Stress Concern Present (08/20/2017)  Tobacco Use: Medium Risk (05/05/2023)    Readmission Risk Interventions     No data to display

## 2023-05-06 NOTE — Progress Notes (Signed)
Patient off floor to MRI

## 2023-05-07 ENCOUNTER — Inpatient Hospital Stay (HOSPITAL_COMMUNITY): Payer: Medicare Other

## 2023-05-07 DIAGNOSIS — I5033 Acute on chronic diastolic (congestive) heart failure: Secondary | ICD-10-CM | POA: Diagnosis not present

## 2023-05-07 LAB — GLUCOSE, CAPILLARY
Glucose-Capillary: 117 mg/dL — ABNORMAL HIGH (ref 70–99)
Glucose-Capillary: 142 mg/dL — ABNORMAL HIGH (ref 70–99)
Glucose-Capillary: 149 mg/dL — ABNORMAL HIGH (ref 70–99)
Glucose-Capillary: 157 mg/dL — ABNORMAL HIGH (ref 70–99)
Glucose-Capillary: 171 mg/dL — ABNORMAL HIGH (ref 70–99)
Glucose-Capillary: 179 mg/dL — ABNORMAL HIGH (ref 70–99)
Glucose-Capillary: 281 mg/dL — ABNORMAL HIGH (ref 70–99)

## 2023-05-07 MED ORDER — FUROSEMIDE 10 MG/ML IJ SOLN
40.0000 mg | Freq: Once | INTRAMUSCULAR | Status: AC
  Administered 2023-05-07: 40 mg via INTRAVENOUS
  Filled 2023-05-07: qty 4

## 2023-05-07 NOTE — Progress Notes (Signed)
RT called to patient's room because patient states he can't breathe. States he "can't feel the oxygen." RT turned oxygen up to 4l for patient comfort. RN aware. RT will continue to monitor.

## 2023-05-07 NOTE — TOC Progression Note (Signed)
Transition of Care [facility]) - Progression Note    Patient Details  Name: David [name] MRN: [id number] Date of Birth: [birth year]  Transition of Care [facility]) CM/SW Contact  Leone Haven, RN Phone Number: 05/07/2023, 10:23 AM  Clinical Narrative:    Per doctor, patient does not need home oxygen but patient wants to pay for it out of pocket.  NCM spoke with patient regarding this, he states he is going to pay out of pocket for the oxygen.  He is ok with [employer] supplying this for him.  NCM made referral to [facility].  Plan is for dc tomorrow , he will need ambulance transport home at dc.  He is set up with [facility] for HHRN, HHPT, HHOT.    Expected Discharge Plan: Home w Home Health Services Barriers to Discharge: Continued Medical Work up  Expected Discharge Plan and Services   Discharge Planning Services: CM Consult Post Acute Care Choice: Durable Medical Equipment, Home Health Living arrangements for the past 2 months: Single Family Home                 DME Arranged: Oxygen DME Agency: [employer]       HH Arranged: RN, PT, OT HH Agency: [facility] Date HH Agency Contacted: 05/06/23 Time HH Agency Contacted: 1300 Representative spoke with at [facility] Agency: Tresa Endo   Social Determinants of Health (SDOH) Interventions SDOH Screenings   Food Insecurity: No Food Insecurity (05/05/2023)  Housing: Low Risk  (05/05/2023)  Transportation Needs: No Transportation Needs (05/05/2023)  Recent Concern: Transportation Needs - Unmet Transportation Needs (05/05/2023)  Utilities: Not At Risk (05/05/2023)  Financial Resource Strain: Low Risk  (08/20/2017)  Physical Activity: Inactive (08/20/2017)  Social Connections: Socially Integrated (12/19/2017)  Stress: No Stress Concern Present (08/20/2017)  Tobacco Use: Medium Risk (05/05/2023)    Readmission Risk Interventions     No data to display

## 2023-05-07 NOTE — Progress Notes (Signed)
PROGRESS NOTE  David ABNEY ZOX:[id number] DOB: 29-Jul-1945 DOA: 05/04/2023 PCP: Renford Dills, MD   LOS: 2 days   Brief Narrative / Interim history: 77 year old male with history of CAD, chronic diastolic CHF, pulmonary hypertension comes into the hospital with shortness of breath.  Patient is not reliable historian but I was able to speak with his wife, she reports that he has been more more dyspneic over the last several days/1 to 2 weeks.  She reports that he is compliant with all medication except for home torsemide that he does not like to take because he is urinating too much.  There is also reports of worsening lower extremity edema.  He is not a smoker, does not drink.  Vitals are stable in the ER, he was placed on supplemental oxygen but not sure whether he was truly hypoxic or not.  Chest x-ray on admission showed moderate right pleural effusion with atelectasis/pneumonia  Subjective / 24h Interval events: He tells me that overall he is feeling better.  Still short of breath and feels the need to keep oxygen on and requested prescription for oxygen at home.  He refused labs this morning telling me "my body is the same as yesterday", and "I have the same kidneys", he refused a chest x-ray telling me I have the same amount that I had yesterday"  Assesement and Plan: Principal Problem:   Diastolic CHF (HCC) Active Problems:   Acute on chronic diastolic CHF (congestive heart failure) (HCC)   Essential hypertension   Stage 3b chronic kidney disease (HCC)   Type 2 diabetes mellitus with hyperlipidemia (HCC)   PAD (peripheral artery disease) (HCC)   Coronary artery disease involving native coronary artery of native heart without angina pectoris   Anemia   Mixed hyperlipidemia   Abscess of left foot   Diabetic foot infection (HCC)   Sacral wound   Pleural effusion   Elevated TSH   Acute on chronic diastolic (congestive) heart failure (HCC)   Malnutrition of moderate  degree   Principal problem Acute on chronic diastolic CHF, pulmonary hypertension-most recent 2D echo was in February 2024, showing LVEF 50-55%, RV was normal.  He has some evidence of fluid overload with a pleural effusion, lower extremity edema in the setting of him to be nonadherent to home diuretics. -Has received IV furosemide in the ED, repeat furosemide today.  Unfortunately it is difficult to properly care for him given his persistent refusal to do repeat chest x-ray as well as repeat blood work.  Appears net -1.6 L -Repeat 2D echo showed LVEF 40-45%, mild concentric LVH, indeterminate diastolic parameters and RV function was mildly reduced.  He has mildly elevated PA pressure, improved however from 70 mmHg at the beginning of this year to 51.  Active problems Possible community-acquired pneumonia-CT scan of the chest done on admission showed right lower lobe consolidation/atelectasis with moderate volume right pleural effusion.  There is no loculation noted.  There may be some additional patchy airspace disease at the left lung base versus edema.  Has been empirically placed on ceftriaxone and metronidazole, continue for now -No culture sent unfortunately.  Refusing repeat chest x-ray today.  I will treat empirically for a 5-day course with antibiotics  Acute hypoxic respiratory failure-on admission, due to #1 and #2.  Patient is stable on room air with request to have oxygen prescribed for home.  He is willing to self-pay, case management consulted  Chronic kidney disease stage IIIb -baseline creatinine ranging from 1.8-2.5, close  to baseline on admission however has been refusing blood work since.   Anemia of chronic renal disease-hemoglobin stable, no bleeding, refusing further blood work  Essential hypertension-continue amlodipine, blood pressure overall stable  Hyperlipidemia-continue statin  Coronary artery disease -with prior PCI in the past, no chest pain, this appears  stable  Concern for left foot infection-MRI negative for osteomyelitis or deep infections  DM2, controlled, with hyperglycemia-continue sliding scale  Lab Results  Component Value Date   HGBA1C 6.4 (H) 05/04/2023   CBG (last 3)  Recent Labs    05/07/23 0003 05/07/23 0436 05/07/23 0801  GLUCAP 117* 142* 171*    Scheduled Meds:  amLODipine  10 mg Oral Daily   atorvastatin  40 mg Oral Daily   enoxaparin (LOVENOX) injection  30 mg Subcutaneous QHS   feeding supplement  237 mL Oral BID BM   Gerhardt's butt cream   Topical BID   guaiFENesin  600 mg Oral BID   hydrocerin   Topical Daily   influenza vaccine adjuvanted  0.5 mL Intramuscular Tomorrow-1000   insulin aspart  0-9 Units Subcutaneous Q4H   isosorbide-hydrALAZINE  2 tablet Oral TID   pneumococcal 20-valent conjugate vaccine  0.5 mL Intramuscular Tomorrow-1000   sodium chloride flush  3 mL Intravenous Q12H   Continuous Infusions:  cefTRIAXone (ROCEPHIN)  IV 2 g (05/06/23 2245)   metronidazole 500 mg (05/07/23 0002)   vancomycin 1,000 mg (05/07/23 0414)   PRN Meds:.acetaminophen **OR** acetaminophen, albuterol, HYDROcodone-acetaminophen, ondansetron **OR** ondansetron (ZOFRAN) IV, sodium chloride flush  Current Outpatient Medications  Medication Instructions   albuterol (PROVENTIL HFA;VENTOLIN HFA) 108 (90 Base) MCG/ACT inhaler 2 puffs, Inhalation, Every 6 hours PRN   amLODipine (NORVASC) 10 mg, Oral, Daily   aspirin EC 81 mg, Oral, Daily   atorvastatin (LIPITOR) 40 mg, Oral, Daily   ferrous sulfate 325 mg, Oral, Daily with breakfast   HUMALOG MIX 75/25 KWIKPEN (75-25) 100 UNIT/ML Kwikpen 26-36 Units, Subcutaneous, Daily   isosorbide-hydrALAZINE (BIDIL) 20-37.5 MG tablet 2 tablets, Oral, 3 times daily   latanoprost (XALATAN) 0.005 % ophthalmic solution 1 drop, Both Eyes, Daily at bedtime   metoprolol succinate (TOPROL-XL) 100 mg, Oral, Daily, Take with or immediately following a meal.   Multiple Vitamin (MULTIVITAMIN  WITH MINERALS) TABS tablet 1 tablet, Oral, Daily   nitroGLYCERIN (NITROSTAT) 0.4 mg, Sublingual, Every 5 min PRN    Diet Orders (From admission, onward)     Start     Ordered   05/05/23 0956  Diet 2 gram sodium Room service appropriate? Yes; Fluid consistency: Thin; Fluid restriction: 1500 mL Fluid  Diet effective now       Question Answer Comment  Room service appropriate? Yes   Fluid consistency: Thin   Fluid restriction: 1500 mL Fluid      05/05/23 0955            DVT prophylaxis: enoxaparin (LOVENOX) injection 30 mg Start: 05/06/23 2200 SCDs Start: 05/05/23 0053   Lab Results  Component Value Date   PLT 253 05/06/2023      Code Status: Full Code  Family Communication: d/w patient's wife over the phone  Status is: Inpatient   Level of care: Telemetry Cardiac  Consultants:  None  Objective: Vitals:   05/07/23 0720 05/07/23 0923 05/07/23 0944 05/07/23 0959  BP: (!) 152/103     Pulse: 80   71  Resp: 17     Temp: 98 F (36.7 C)     TempSrc: Oral     SpO2:  97%  97% 98%  Weight:  99.6 kg    Height:        Intake/Output Summary (Last 24 hours) at 05/07/2023 1017 Last data filed at 05/07/2023 0925 Gross per 24 hour  Intake 540 ml  Output 1000 ml  Net -460 ml   Wt Readings from Last 3 Encounters:  05/07/23 99.6 kg  07/04/22 103.1 kg  06/13/22 104.3 kg    Examination:  Constitutional: NAD Eyes: lids and conjunctivae normal, no scleral icterus ENMT: mmm Neck: normal, supple Respiratory: clear to auscultation bilaterally, no wheezing, no crackles. Normal respiratory effort.  Cardiovascular: Regular rate and rhythm, no murmurs / rubs / gallops. No LE edema. Abdomen: soft, no distention, no tenderness. Bowel sounds positive.   Data Reviewed: I have independently reviewed following labs and imaging studies   CBC Recent Labs  Lab 05/04/23 2045 05/04/23 2317 05/05/23 0732 05/06/23 0251  WBC 9.3  --  8.1 8.0  HGB 9.0* 9.5* 8.6* 7.9*  HCT  29.4* 28.0* 27.4* 25.6*  PLT 293  --  279 253  MCV 110.1*  --  110.5* 110.3*  MCH 33.7  --  34.7* 34.1*  MCHC 30.6  --  31.4 30.9  RDW 14.4  --  14.2 14.2  LYMPHSABS 0.7  --   --   --   MONOABS 0.8  --   --   --   EOSABS 0.1  --   --   --   BASOSABS 0.0  --   --   --     Recent Labs  Lab 05/04/23 2040 05/04/23 2045 05/04/23 2047 05/04/23 2302 05/04/23 2305 05/04/23 2317 05/05/23 0732 05/06/23 0251  NA  --  139  --   --   --  139 135 138  K  --  4.2  --   --   --  4.2 4.1 4.4  CL  --  110  --   --   --   --  107 109  CO2  --  20*  --   --   --   --  23 21*  GLUCOSE  --  206*  --   --   --   --  179* 103*  BUN  --  41*  --   --   --   --  40* 42*  CREATININE  --  2.20*  --   --   --   --  1.93* 2.45*  CALCIUM  --  8.7*  --   --   --   --  8.4* 8.3*  AST  --  30  --   --   --   --  23 19  ALT  --  34  --   --   --   --  29 25  ALKPHOS  --  161*  --   --   --   --  143* 136*  BILITOT  --  0.9  --   --   --   --  1.1 0.6  ALBUMIN  --  2.9*  --   --   --   --  2.8* 2.6*  MG  --   --   --   --  2.7*  --  2.5* 2.6*  CRP  --   --   --   --  3.4*  --   --   --   DDIMER 3.69*  --   --   --   --   --   --   --  PROCALCITON  --   --   --   --  <0.10  --   --   --   LATICACIDVEN  --   --   --  0.9  --   --   --   --   TSH  --   --   --   --  4.587*  --   --   --   HGBA1C  --   --   --   --  6.4*  --   --   --   BNP  --   --  455.1*  --   --   --   --   --     ------------------------------------------------------------------------------------------------------------------ No results for input(s): "CHOL", "HDL", "LDLCALC", "TRIG", "CHOLHDL", "LDLDIRECT" in the last 72 hours.  Lab Results  Component Value Date   HGBA1C 6.4 (H) 05/04/2023   ------------------------------------------------------------------------------------------------------------------ Recent Labs    05/04/23 2305  TSH 4.587*    Cardiac Enzymes No results for input(s): "CKMB", "TROPONINI", "MYOGLOBIN" in  the last 168 hours.  Invalid input(s): "CK" ------------------------------------------------------------------------------------------------------------------    Component Value Date/Time   BNP 455.1 (H) 05/04/2023 2047    CBG: Recent Labs  Lab 05/06/23 1605 05/06/23 1959 05/07/23 0003 05/07/23 0436 05/07/23 0801  GLUCAP 128* 145* 117* 142* 171*    No results found for this or any previous visit (from the past 240 hours).   Radiology Studies: No results found.    Pamella Pert, MD, PhD [facility]  Between 7 am - 7 pm I am available, please contact me via Amion (for emergencies) or Securechat (non urgent messages)  Between 7 pm - 7 am I am not available, please contact night coverage MD/APP via Amion

## 2023-05-07 NOTE — Progress Notes (Signed)
Physical Therapy Treatment Patient Details Name: JOSEANTONIO HEINO MRN: [id number] DOB: August 29, 1945 Today's Date: 05/07/2023   History of Present Illness 77 y.o. male Presented 05/04/23 with shortness of breath and diarrhea. +acute CHF, possible abscess Lt foot and Rt pleural effusion. PMH significant of CAD, diastolic CHF, pulmonary hypertension, and CKD    PT Comments  Pt resting in bed and reports just waking up, with encouragement pt agreeable to therapy. Pt able to initiate walking LE's off EOB with min assist required to complete and raise trunk to sit upright. Pt educated on updated WB orders for WBAT on Lt LE and session focused on sit<>stands. Standing scale utilized for updated standing weight. EOB elevated to facilitate power up and Mod assist provided for rise and to stabilize standing balance. Pt achieved upright posture on first stand but fatigued on second and trunk flexed with increased assist required to remain upright. Pt unable to stand with RW from fatigue and lateral scoots with min assist performed to move towards [facility] prior to return to supine. Reviewed follow up therapy recs as pt's wife encouraging pt to pursue higher level of rehab. Patient will benefit from continued inpatient follow up therapy, <3 hours/day. Will continue to progress pt as able. If pt returns home he will require maximized [facility] services and below listed DME.   If plan is discharge home, recommend the following: Two people to help with walking and/or transfers;Assist for transportation;Help with stairs or ramp for entrance;Two people to help with bathing/dressing/bathroom;Assistance with cooking/housework   Can travel by private vehicle     No  Equipment Recommendations  Wheelchair (measurements PT);Wheelchair cushion (measurements PT);Hospital bed (slideboard; drop arm BSC)    Recommendations for Other Services OT consult     Precautions / Restrictions Precautions Precautions:  Fall Restrictions Weight Bearing Restrictions Per Provider Order: Yes LLE Weight Bearing Per Provider Order: Non weight bearing     Mobility  Bed Mobility Overal bed mobility: Needs Assistance Bed Mobility: Supine to Sit, Sit to Supine     Supine to sit: Min assist, HOB elevated, Used rails Sit to supine: Min assist, HOB elevated, Used rails   General bed mobility comments: cues to use bed features, min assist to raise trunk upright. Pt able to use UE's to scoot anterior at EOB.    Transfers Overall transfer level: Needs assistance Equipment used: None Transfers: Sit to/from Stand, Bed to chair/wheelchair/BSC Sit to Stand: Mod assist, From elevated surface          Lateral/Scoot Transfers: Min assist General transfer comment: Mod assist for power up from EOB to scale. Pt able to complete 2x from EOB, silghtly elevated, with mod assist. posture flexed and pt fatiguing more on second stand and increased assist to maintain upright posture. pt required min assist for lateral scoots along EOB to move towards HOB.    Ambulation/Gait                   Stairs             Wheelchair Mobility     Tilt Bed    Modified Rankin (Stroke Patients Only)       Balance Overall balance assessment: Needs assistance Sitting-balance support: Feet supported, Bilateral upper extremity supported, Single extremity supported Sitting balance-Leahy Scale: Fair     Standing balance support: Bilateral upper extremity supported Standing balance-Leahy Scale: Poor Standing balance comment: heavy reliance on external support  Cognition Arousal: Alert Behavior During Therapy: WFL for tasks assessed/performed Overall Cognitive Status: No family/caregiver present to determine baseline cognitive functioning                                          Exercises      General Comments        Pertinent Vitals/Pain Pain  Assessment Pain Assessment: Faces Faces Pain Scale: No hurt Pain Intervention(s): Monitored during session, Repositioned    Home Living                          Prior Function            PT Goals (current goals can now be found in the care plan section) Acute Rehab PT Goals Patient Stated Goal: return home PT Goal Formulation: With patient Time For Goal Achievement: 05/19/23 Potential to Achieve Goals: Fair Progress towards PT goals: Progressing toward goals    Frequency    Min 1X/week      PT Plan      Co-evaluation              AM-PAC PT "6 Clicks" Mobility   Outcome Measure  Help needed turning from your back to your side while in a flat bed without using bedrails?: A Little Help needed moving from lying on your back to sitting on the side of a flat bed without using bedrails?: A Little Help needed moving to and from a bed to a chair (including a wheelchair)?: A Lot Help needed standing up from a chair using your arms (e.g., wheelchair or bedside chair)?: Total Help needed to walk in hospital room?: Total Help needed climbing 3-5 steps with a railing? : Total 6 Click Score: 11    End of Session Equipment Utilized During Treatment: Gait belt;Oxygen Activity Tolerance: Patient tolerated treatment well Patient left: in bed;with call bell/phone within reach;with bed alarm set;with family/visitor present Nurse Communication: Mobility status PT Visit Diagnosis: Muscle weakness (generalized) (M62.81);Difficulty in walking, not elsewhere classified (R26.2)     Time: 1308-6578 PT Time Calculation (min) (ACUTE ONLY): 39 min  Charges:    $Therapeutic Activity: 38-52 mins PT General Charges $$ ACUTE PT VISIT: 1 Visit                     Wynn Maudlin, DPT Acute Rehabilitation Services Office [phone]  05/07/23 5:39 PM

## 2023-05-07 NOTE — Plan of Care (Signed)
  Problem: Coping: Goal: Ability to adjust to condition or change in health will improve Outcome: Not Progressing

## 2023-05-07 NOTE — Progress Notes (Signed)
Heart Failure Navigator Progress Note  Assessed for Heart & Vascular TOC clinic readiness.  Patient EF 40-45%, spoke with patient and he see's Dr. Rosemary Holms for Cardiology, stated he would like to stay with him and will call for a follow up appointment. .   Navigator will sign off at this time.   Rhae Hammock, BSN, Scientist, clinical (histocompatibility and immunogenetics) Only

## 2023-05-07 NOTE — Progress Notes (Signed)
Patient has been sating between 95-100 percent on room. Patient shares that he feel short of breath and unable to breath without 0xygen. MD notified. 02  1L applied per patient's request.

## 2023-05-08 ENCOUNTER — Inpatient Hospital Stay (HOSPITAL_COMMUNITY): Payer: Medicare Other

## 2023-05-08 DIAGNOSIS — I5033 Acute on chronic diastolic (congestive) heart failure: Secondary | ICD-10-CM | POA: Diagnosis not present

## 2023-05-08 LAB — COMPREHENSIVE METABOLIC PANEL
ALT: 25 U/L (ref 0–44)
AST: 26 U/L (ref 15–41)
Albumin: 2.7 g/dL — ABNORMAL LOW (ref 3.5–5.0)
Alkaline Phosphatase: 162 U/L — ABNORMAL HIGH (ref 38–126)
Anion gap: 7 (ref 5–15)
BUN: 44 mg/dL — ABNORMAL HIGH (ref 8–23)
CO2: 22 mmol/L (ref 22–32)
Calcium: 8.3 mg/dL — ABNORMAL LOW (ref 8.9–10.3)
Chloride: 107 mmol/L (ref 98–111)
Creatinine, Ser: 2.82 mg/dL — ABNORMAL HIGH (ref 0.61–1.24)
GFR, Estimated: 22 mL/min — ABNORMAL LOW (ref >60)
Glucose, Bld: 168 mg/dL — ABNORMAL HIGH (ref 70–99)
Potassium: 4.8 mmol/L (ref 3.5–5.1)
Sodium: 136 mmol/L (ref 135–145)
Total Bilirubin: 0.5 mg/dL (ref <1.2)
Total Protein: 6.9 g/dL (ref 6.5–8.1)

## 2023-05-08 LAB — GLUCOSE, CAPILLARY
Glucose-Capillary: 145 mg/dL — ABNORMAL HIGH (ref 70–99)
Glucose-Capillary: 124 mg/dL — ABNORMAL HIGH (ref 70–99)
Glucose-Capillary: 147 mg/dL — ABNORMAL HIGH (ref 70–99)
Glucose-Capillary: 167 mg/dL — ABNORMAL HIGH (ref 70–99)
Glucose-Capillary: 201 mg/dL — ABNORMAL HIGH (ref 70–99)

## 2023-05-08 LAB — CBC
HCT: 24.8 % — ABNORMAL LOW (ref 39.0–52.0)
Hemoglobin: 7.6 g/dL — ABNORMAL LOW (ref 13.0–17.0)
MCH: 34.2 pg — ABNORMAL HIGH (ref 26.0–34.0)
MCHC: 30.6 g/dL (ref 30.0–36.0)
MCV: 111.7 fL — ABNORMAL HIGH (ref 80.0–100.0)
Platelets: 232 10*3/uL (ref 150–400)
RBC: 2.22 MIL/uL — ABNORMAL LOW (ref 4.22–5.81)
RDW: 14.4 % (ref 11.5–15.5)
WBC: 8.1 10*3/uL (ref 4.0–10.5)
nRBC: 0 % (ref 0.0–0.2)

## 2023-05-08 LAB — MAGNESIUM: Magnesium: 2.5 mg/dL — ABNORMAL HIGH (ref 1.7–2.4)

## 2023-05-08 NOTE — Progress Notes (Signed)
Occupational Therapy Treatment Patient Details Name: David [name] MRN: [id number] DOB: [birth year] Today's Date: 05/08/2023   History of present illness 77 y.o. male Presented 05/04/23 with shortness of breath and diarrhea. +acute CHF, possible abscess Lt foot and Rt pleural effusion. PMH significant of CAD, diastolic CHF, pulmonary hypertension, and CKD   OT comments  OT session focused on training in techniques for increased safety and independence with ADLs with pt currently demonstrating ability to largely complete UB ADLs with Set up to Min assist, LB ADLs with Max to Total assist, and functional lateral scoot transfers using a sliding board with Min assist +2 for safety. Pt participated well in session and is making progress toward goals. Pt will benefit from continued acute skilled OT services to address deficits outlined below, decrease caregiver burden, and increase safety and independence with functional tasks. Post acute discharge, pt will benefit from intensive inpatient skilled rehab services < 3 hours per day to maximize rehab potential.       If plan is discharge home, recommend the following:  Two people to help with walking and/or transfers;Two people to help with bathing/dressing/bathroom;Assistance with cooking/housework;Help with stairs or ramp for entrance;Assist for transportation   Equipment Recommendations  Other (comment) (defer to next level of care; if pt discharging home, pt will require a drop-arm BSC with large platform seat for sliding board transfers and a sliding board)    Recommendations for Other Services      Precautions / Restrictions Precautions Precautions: Fall Restrictions Weight Bearing Restrictions Per Provider Order: Yes LLE Weight Bearing Per Provider Order: Weight bearing as tolerated       Mobility Bed Mobility Overal bed mobility: Needs Assistance Bed Mobility: Supine to Sit     Supine to sit: Contact guard, Min assist, HOB  elevated, Used rails          Transfers Overall transfer level: Needs assistance Equipment used: Sliding board, Rolling walker (2 wheels) Transfers: Bed to chair/wheelchair/BSC, Sit to/from Stand Sit to Stand: Max assist, +2 physical assistance, +2 safety/equipment          Lateral/Scoot Transfers: Min assist, +2 safety/equipment, With slide board General transfer comment: Pt unable to come to full stand this session with Max assist +2 and use of RW but making good attempt.     Balance Overall balance assessment: Needs assistance Sitting-balance support: Single extremity supported, Bilateral upper extremity supported, Feet supported Sitting balance-Leahy Scale: Fair     Standing balance support: Bilateral upper extremity supported, During functional activity, Reliant on assistive device for balance Standing balance-Leahy Scale: Poor Standing balance comment: heavy reliance on external support; unable to come to full stand this session                           ADL either performed or assessed with clinical judgement   ADL Overall ADL's : Needs assistance/impaired Eating/Feeding: Set up;Sitting Eating/Feeding Details (indicate cue type and reason): Pt with limited intake per wife and RN with pt reporting he has not been hungry.             Upper Body Dressing : Set up;Sitting   Lower Body Dressing: Maximal assistance;Sit to/from stand;Sitting/lateral leans   Toilet Transfer: Minimal assistance;+2 for safety/equipment;Cueing for sequencing;Requires drop arm;BSC/3in1;Transfer board (Cues for technique) Toilet Transfer Details (indicate cue type and reason): simulated with drop-arm recliner           General ADL Comments: Pt with decreased activity tolerance  and noted mild SOB with activity while on 5L continuous O2 through nasal cannula. Pt occasionally appearing frustrated or discouraged by current level of function but with pt responding well to  encouragement and positive reinforcement of successes during session.    Extremity/Trunk Assessment Upper Extremity Assessment Upper Extremity Assessment: Right hand dominant;Generalized weakness;RUE deficits/detail;LUE deficits/detail RUE Deficits / Details: biceps 3+, triceps 4; hx of neuropathy; decreased coordination RUE Sensation: history of peripheral neuropathy RUE Coordination: decreased fine motor;decreased gross motor (mild) LUE Deficits / Details: biceps 3+, triceps 3+; hx of neuropathy; decreased coordination LUE Sensation: history of peripheral neuropathy LUE Coordination: decreased gross motor;decreased fine motor (mild)   Lower Extremity Assessment Lower Extremity Assessment: Defer to PT evaluation        Vision       Perception     Praxis      Cognition Arousal: Alert Behavior During Therapy: WFL for tasks assessed/performed Overall Cognitive Status: Impaired/Different from baseline Area of Impairment: Attention, Memory, Following commands, Safety/judgement, Awareness, Problem solving                   Current Attention Level: Sustained Memory: Decreased recall of precautions, Decreased short-term memory Following Commands: Follows one step commands consistently, Follows one step commands with increased time Safety/Judgement: Decreased awareness of safety, Decreased awareness of deficits Awareness: Emergent Problem Solving: Slow processing, Difficulty sequencing, Requires verbal cues General Comments: Oriented to self, family, place, and situation and pleasant throughout session but requiring reminders regarding prior education and conversations from earlier in the day and previous day. Pt benefits from encouragement and positive reinforcement throughout session.        Exercises      Shoulder Instructions       General Comments Pt's wife present throughout session. RN present during a portion of session.    Pertinent Vitals/ Pain       Pain  Assessment Pain Assessment: Faces Faces Pain Scale: Hurts little more Pain Location: L heel, B UE Pain Descriptors / Indicators: Discomfort, Grimacing Pain Intervention(s): Limited activity within patient's tolerance, Monitored during session, Repositioned, Other (comment) (covered arm rests of recliner when pt in sitting position for increased comfort secondary to neuropathic pain in B UE when touching cold surfaces)  Home Living                                          Prior Functioning/Environment              Frequency  Min 1X/week        Progress Toward Goals  OT Goals(current goals can now be found in the care plan section)  Progress towards OT goals: Progressing toward goals  Acute Rehab OT Goals Patient Stated Goal: to be able to stand and return home  Plan      Co-evaluation    PT/OT/SLP Co-Evaluation/Treatment: Yes Reason for Co-Treatment: For patient/therapist safety;To address functional/ADL transfers   OT goals addressed during session: ADL's and self-care;Proper use of Adaptive equipment and DME      AM-PAC OT "6 Clicks" Daily Activity     Outcome Measure   Help from another person eating meals?: A Little Help from another person taking care of personal grooming?: A Little Help from another person toileting, which includes using toliet, bedpan, or urinal?: Total Help from another person bathing (including washing, rinsing, drying)?: A Lot Help from another person  to put on and taking off regular upper body clothing?: A Little Help from another person to put on and taking off regular lower body clothing?: A Lot 6 Click Score: 14    End of Session Equipment Utilized During Treatment: Gait belt;Rolling walker (2 wheels);Oxygen;Other (comment) (sliding board)  OT Visit Diagnosis: Unsteadiness on feet (R26.81);Other abnormalities of gait and mobility (R26.89);Muscle weakness (generalized) (M62.81);Other symptoms and signs involving  cognitive function;Other (comment) (decreased activity tolerance)   Activity Tolerance Patient tolerated treatment well   Patient Left in chair;with call bell/phone within reach;with chair alarm set;with family/visitor present   Nurse Communication Mobility status;Other (comment) (Pt requiring Min assist +2 for safety with sliding board transfer)        Time: 4132-4401 OT Time Calculation (min): 40 min  Charges: OT General Charges $OT Visit: 1 Visit OT Treatments $Self Care/Home Management : 8-22 mins  Imelda Dandridge "Orson Eva., OTR/L, MA Acute Rehab [phone]   Lendon Colonel 05/08/2023, 1:23 PM

## 2023-05-08 NOTE — Progress Notes (Signed)
Physical Therapy Treatment Patient Details Name: David [name] MRN: [id number] DOB: 1945-09-13 Today's Date: 05/08/2023   History of Present Illness 77 y.o. male Presented 05/04/23 with shortness of breath and diarrhea. +acute CHF, possible abscess Lt foot and Rt pleural effusion. PMH significant of CAD, diastolic CHF, pulmonary hypertension, and CKD    PT Comments  Patient resting in bed and agreeable to mobilize with therapy, spouse present at bedside. Pt required min assist and heavy use of bed features to move supine>sit. Session initiated with education on slide board transfer, and multistep lateral scoot completed for bed>chair. Once in recliner pt provided RW for attempted sit<>stand however significantly challenging due to low height. Max +2 assist provided for rise and pt achieved ~50% stand. Continued discussion on mobility and rehab benefits. Continue to recommend continued inpatient follow up therapy, <3 hours/day. Will continue to progress pt as able.    If plan is discharge home, recommend the following: Two people to help with walking and/or transfers;Assist for transportation;Help with stairs or ramp for entrance;Two people to help with bathing/dressing/bathroom;Assistance with cooking/housework   Can travel by private vehicle     No  Equipment Recommendations  Wheelchair (measurements PT);Wheelchair cushion (measurements PT);Hospital bed (slideboard; drop arm BSC)    Recommendations for Other Services OT consult     Precautions / Restrictions Precautions Precautions: Fall Restrictions Weight Bearing Restrictions Per Provider Order: Yes LLE Weight Bearing Per Provider Order: Weight bearing as tolerated     Mobility  Bed Mobility Overal bed mobility: Needs Assistance Bed Mobility: Supine to Sit     Supine to sit: Contact guard, Min assist, HOB elevated, Used rails     General bed mobility comments: cues to use bed features, min assist to raise trunk upright.  Pt able to use UE's to scoot anterior at EOB.    Transfers Overall transfer level: Needs assistance Equipment used: Sliding board, Rolling walker (2 wheels) Transfers: Bed to chair/wheelchair/BSC, Sit to/from Stand Sit to Stand: Max assist, +2 physical assistance, +2 safety/equipment          Lateral/Scoot Transfers: Min assist, +2 safety/equipment, With slide board General transfer comment: Pt unable to come to full stand this session with Max assist +2 and use of RW but making good attempt.    Ambulation/Gait                   Stairs             Wheelchair Mobility     Tilt Bed    Modified Rankin (Stroke Patients Only)       Balance Overall balance assessment: Needs assistance Sitting-balance support: Single extremity supported, Bilateral upper extremity supported, Feet supported Sitting balance-Leahy Scale: Fair     Standing balance support: Bilateral upper extremity supported, During functional activity, Reliant on assistive device for balance Standing balance-Leahy Scale: Poor Standing balance comment: heavy reliance on external support; unable to come to full stand this session                            Cognition Arousal: Alert Behavior During Therapy: WFL for tasks assessed/performed Overall Cognitive Status: Impaired/Different from baseline Area of Impairment: Attention, Memory, Following commands, Safety/judgement, Awareness, Problem solving                   Current Attention Level: Sustained Memory: Decreased recall of precautions, Decreased short-term memory Following Commands: Follows one step commands consistently, Follows one  step commands with increased time Safety/Judgement: Decreased awareness of safety, Decreased awareness of deficits Awareness: Emergent Problem Solving: Slow processing, Difficulty sequencing, Requires verbal cues General Comments: Oriented to self, family, place, and situation and pleasant  throughout session but requiring reminders regarding prior education and conversations from earlier in the day and previous day. Pt benefits from encouragement and positive reinforcement throughout session.        Exercises      General Comments General comments (skin integrity, edema, etc.): Pt's wife present throughout session. RN present during a portion of session.      Pertinent Vitals/Pain Pain Assessment Pain Assessment: Faces Faces Pain Scale: Hurts little more Pain Location: L heel, B UE Pain Descriptors / Indicators: Discomfort, Grimacing Pain Intervention(s): Limited activity within patient's tolerance, Monitored during session, Repositioned    Home Living                          Prior Function            PT Goals (current goals can now be found in the care plan section) Acute Rehab PT Goals Patient Stated Goal: return home PT Goal Formulation: With patient Time For Goal Achievement: 05/19/23 Potential to Achieve Goals: Fair Progress towards PT goals: Progressing toward goals    Frequency    Min 1X/week      PT Plan      Co-evaluation PT/OT/SLP Co-Evaluation/Treatment: Yes Reason for Co-Treatment: For patient/therapist safety;To address functional/ADL transfers PT goals addressed during session: Mobility/safety with mobility;Balance;Proper use of DME OT goals addressed during session: ADL's and self-care;Proper use of Adaptive equipment and DME      AM-PAC PT "6 Clicks" Mobility   Outcome Measure  Help needed turning from your back to your side while in a flat bed without using bedrails?: A Little Help needed moving from lying on your back to sitting on the side of a flat bed without using bedrails?: A Little Help needed moving to and from a bed to a chair (including a wheelchair)?: A Lot Help needed standing up from a chair using your arms (e.g., wheelchair or bedside chair)?: Total Help needed to walk in hospital room?: Total Help  needed climbing 3-5 steps with a railing? : Total 6 Click Score: 11    End of Session Equipment Utilized During Treatment: Gait belt;Oxygen Activity Tolerance: Patient tolerated treatment well Patient left: with family/visitor present;in chair;with call bell/phone within reach;with chair alarm set Nurse Communication: Mobility status PT Visit Diagnosis: Muscle weakness (generalized) (M62.81);Difficulty in walking, not elsewhere classified (R26.2)     Time: 1308-6578 PT Time Calculation (min) (ACUTE ONLY): 40 min  Charges:    $Therapeutic Activity: 23-37 mins PT General Charges $$ ACUTE PT VISIT: 1 Visit                     Wynn Maudlin, DPT Acute Rehabilitation Services Office [phone]  05/08/23 1:42 PM

## 2023-05-08 NOTE — TOC Progression Note (Addendum)
Transition of Care [facility]) - Progression Note    Patient Details  Name: David [name] MRN: [id number] Date of Birth: 05/07/1946  Transition of Care [facility]) CM/SW Contact  Michaela Corner, [place] Phone Number: 05/08/2023, 8:52 AM  Clinical Narrative:   CSW spoke with pts wife, David [name], and family would like to pursue SNF at this time. CSW completed SNF workup; pending bed offers.   10:30AM: CSW provided Medicare.gov bed list to pt with accepting facilities. Pt has not made a choice of SNF at this time. CSW will f/u with pt and wife.   Skilled Nursing Rehab Facilities-   [email or web address]     Ratings out of 5 stars (5 the highest)    Name Address  Phone # Quality Care Staffing Health Inspection Overall  [facility] [address] [phone] 2 2 5 5   [facility] [address], [place] [phone] 2 1 2 1   [facility] [address], [place] [phone] 5 1 2 2   [facility] [address], [place] [phone] 2 2 2 2   [facility] [address] [phone] 3 1 1 1      Expected Discharge Plan: Home w Home Health Services Barriers to Discharge: Continued Medical Work up  Expected Discharge Plan and Services   Discharge Planning Services: CM Consult Post Acute Care Choice: Durable Medical Equipment, Home Health Living arrangements for the past 2 months: Single Family Home                 DME Arranged: Oxygen DME Agency: [employer]       HH Arranged: RN, PT, OT HH Agency: [facility] Date [facility] Agency Contacted: 05/06/23 Time HH Agency Contacted: 1300 Representative spoke with at [facility] Agency: Tresa Endo   Social Determinants of Health (SDOH) Interventions SDOH Screenings   Food Insecurity: No Food Insecurity (05/05/2023)  Housing: Low Risk  (05/05/2023)  Transportation Needs: No Transportation Needs (05/05/2023)  Recent Concern:  Transportation Needs - Unmet Transportation Needs (05/05/2023)  Utilities: Not At Risk (05/05/2023)  Financial Resource Strain: Low Risk  (08/20/2017)  Physical Activity: Inactive (08/20/2017)  Social Connections: Socially Integrated (12/19/2017)  Stress: No Stress Concern Present (08/20/2017)  Tobacco Use: Medium Risk (05/05/2023)    Readmission Risk Interventions     No data to display

## 2023-05-08 NOTE — Progress Notes (Signed)
2:15PM: CSW met pt and spouse at bedside for choice of SNF. Pts wife, [name], states that she is going to tour accepting facilities and would like someone to follow up with her on her choice.  Johnnette Gourd, MSW, LCSWA Transitions of Care [phone]

## 2023-05-08 NOTE — [place] FL2 (Signed)
[place] MEDICAID FL2 LEVEL OF CARE FORM     IDENTIFICATION  Patient Name: David [name] Birthdate: [birth year] Sex: male Admission Date (Current Location): 05/04/2023  [facility] [place] [facility] [place] Number:  Producer, television/film/video and Address:  [facility] [place], [address] [place], [place] [address]      Provider Number: [id number]  Attending Physician Name and Address:  Leatha Gilding, MD  Relative Name and Phone Number:       Current Level of Care: Hospital Recommended Level of Care: Skilled Nursing Facility Prior Approval Number:    Date Approved/Denied:   PASRR Number: [id number]  Discharge Plan: SNF    Current Diagnoses: Patient Active Problem List   Diagnosis Date Noted   Malnutrition of moderate degree 05/06/2023   Diabetic foot infection (HCC) 05/05/2023   Sacral wound 05/05/2023   Pleural effusion 05/05/2023   Elevated TSH 05/05/2023   Acute on chronic diastolic (congestive) heart failure (HCC) 05/05/2023   Diastolic CHF (HCC) 05/04/2023   Class 1 obesity 06/30/2022   Acute on chronic diastolic CHF (congestive heart failure) (HCC) 06/29/2022   PAD (peripheral artery disease) (HCC) 06/13/2022   Critical limb ischemia of left lower extremity (HCC) 03/21/2021   Nonhealing ulcer of left lower extremity limited to breakdown of skin (HCC) 03/21/2021   Abscess of left foot 08/03/2020   Osteomyelitis of fifth toe of left foot (HCC)    Mixed hyperlipidemia 10/15/2018   Bilateral leg edema 09/17/2018   Stage 3b chronic kidney disease (HCC) 09/10/2018   Anemia 09/10/2018   Pain and swelling of wrist, right 08/14/2018   Bilateral carotid artery stenosis 07/30/2018   Coronary artery disease involving native coronary artery of native heart without angina pectoris 07/30/2018   Snoring 02/11/2018   Chronic heart failure with preserved ejection fraction (HFpEF) (HCC) 12/22/2017   At risk for adverse drug reaction 12/15/2017   Chronic pain syndrome  12/15/2017   Status post coronary artery stent placement    Multifocal pneumonia 03/30/2017   Type 2 diabetes mellitus with hyperlipidemia (HCC) 03/29/2017   Acute on chronic respiratory failure with hypoxia (HCC)    Pulmonary congestion    Acquired contracture of Achilles tendon, right 08/19/2016   Amputated great toe, right (HCC) 07/18/2016   Onychomycosis 05/29/2016   Diabetic polyneuropathy associated with type 2 diabetes mellitus (HCC) 04/09/2016   Right foot ulcer, limited to breakdown of skin (HCC) 04/09/2016   Hereditary and idiopathic peripheral neuropathy 09/15/2013   Malaise 08/14/2013   Essential hypertension 08/14/2013   Chronic back pain 08/14/2013   Glaucoma 08/14/2013   Headache 08/14/2013    Orientation RESPIRATION BLADDER Height & Weight     Self, Time, Situation, Place  O2 (3L O2 [place]) External catheter, Incontinent Weight: 222 lb 3.6 oz (100.8 kg) Height:  6\' 1"  (185.4 cm)  BEHAVIORAL SYMPTOMS/MOOD NEUROLOGICAL BOWEL NUTRITION STATUS      Continent Diet (See dc summary)  AMBULATORY STATUS COMMUNICATION OF NEEDS Skin   Extensive Assist Verbally PU Stage and Appropriate Care, Other (Comment) (Pressure Injury Sacrum Medial Stage 2; Wound / Incision Non-pressure wound Foot Right;Left)                       Personal Care Assistance Level of Assistance  Bathing, Feeding, Dressing Bathing Assistance: Maximum assistance Feeding assistance: Limited assistance Dressing Assistance: Maximum assistance     Functional Limitations Info  Sight, Speech, Hearing Sight Info: Adequate Hearing Info: Adequate Speech Info: Adequate  SPECIAL CARE FACTORS FREQUENCY  PT (By licensed PT), OT (By licensed OT)     PT Frequency: 5x week OT Frequency: 5x week            Contractures Contractures Info: Not present    Additional Factors Info  Code Status Code Status Info: Full             Current Medications (05/08/2023):  This is the current hospital active  medication list Current Facility-Administered Medications  Medication Dose Route Frequency Provider Last Rate Last Admin   acetaminophen (TYLENOL) tablet 650 mg  650 mg Oral Q6H PRN Therisa Doyne, MD   650 mg at 05/05/23 1225   Or   acetaminophen (TYLENOL) suppository 650 mg  650 mg Rectal Q6H PRN Doutova, Anastassia, MD       albuterol (PROVENTIL) (2.5 MG/3ML) 0.083% nebulizer solution 2.5 mg  2.5 mg Nebulization Q2H PRN Doutova, Anastassia, MD       amLODipine (NORVASC) tablet 10 mg  10 mg Oral Daily Doutova, Anastassia, MD   10 mg at 05/07/23 0841   atorvastatin (LIPITOR) tablet 40 mg  40 mg Oral Daily Doutova, Jonny Ruiz, MD   40 mg at 05/07/23 0841   cefTRIAXone (ROCEPHIN) 2 g in sodium chloride 0.9 % 100 mL IVPB  2 g Intravenous Q24H Doutova, Anastassia, MD 200 mL/hr at 05/07/23 2349 2 g at 05/07/23 2349   enoxaparin (LOVENOX) injection 30 mg  30 mg Subcutaneous QHS Leatha Gilding, MD   30 mg at 05/07/23 2334   feeding supplement (ENSURE ENLIVE / ENSURE PLUS) liquid 237 mL  237 mL Oral BID BM Leatha Gilding, MD   237 mL at 05/07/23 0841   Gerhardt's butt cream   Topical BID Leatha Gilding, MD   Given at 05/07/23 0841   guaiFENesin (MUCINEX) 12 hr tablet 600 mg  600 mg Oral BID Therisa Doyne, MD   600 mg at 05/07/23 0841   hydrocerin (EUCERIN) cream   Topical Daily Leatha Gilding, MD   Given at 05/07/23 0840   HYDROcodone-acetaminophen (NORCO/VICODIN) 5-325 MG per tablet 1-2 tablet  1-2 tablet Oral Q4H PRN Leatha Gilding, MD   2 tablet at 05/08/23 0006   influenza vaccine adjuvanted (FLUAD) injection 0.5 mL  0.5 mL Intramuscular Tomorrow-1000 Gherghe, Daylene Katayama, MD       insulin aspart (novoLOG) injection 0-9 Units  0-9 Units Subcutaneous Q4H Therisa Doyne, MD   1 Units at 05/08/23 0431   isosorbide-hydrALAZINE (BIDIL) 20-37.5 MG per tablet 2 tablet  2 tablet Oral TID Therisa Doyne, MD   2 tablet at 05/07/23 2003   ondansetron (ZOFRAN) tablet 4 mg  4 mg  Oral Q6H PRN Therisa Doyne, MD       Or   ondansetron (ZOFRAN) injection 4 mg  4 mg Intravenous Q6H PRN Doutova, Anastassia, MD       pneumococcal 20-valent conjugate vaccine (PREVNAR 20) injection 0.5 mL  0.5 mL Intramuscular Tomorrow-1000 Gherghe, Daylene Katayama, MD       sodium chloride flush (NS) 0.9 % injection 3 mL  3 mL Intravenous Q12H Doutova, Anastassia, MD   3 mL at 05/07/23 2300   sodium chloride flush (NS) 0.9 % injection 3 mL  3 mL Intravenous PRN Therisa Doyne, MD         Discharge Medications: Please see discharge summary for a list of discharge medications.  Relevant Imaging Results:  Relevant Lab Results:   Additional Information SS# [id number]  [address]  Gertie Exon, LCSWA

## 2023-05-08 NOTE — Progress Notes (Signed)
Patient's wife likes [facility] at [address], please get back to her about this selection. Thank you

## 2023-05-08 NOTE — Progress Notes (Signed)
PROGRESS NOTE  David [name] ZOX:[id number] DOB: 04-May-1946 DOA: 05/04/2023 PCP: Renford Dills, MD   LOS: 3 days   Brief Narrative / Interim history: 77 year old male with history of CAD, chronic diastolic CHF, pulmonary hypertension comes into the hospital with shortness of breath.  Patient is not reliable historian but I was able to speak with his wife, she reports that he has been more more dyspneic over the last several days/1 to 2 weeks.  She reports that he is compliant with all medication except for home torsemide that he does not like to take because he is urinating too much.  There is also reports of worsening lower extremity edema.  He is not a smoker, does not drink.  Vitals are stable in the ER, he was placed on supplemental oxygen but not sure whether he was truly hypoxic or not.  Chest x-ray on admission showed moderate right pleural effusion with atelectasis/pneumonia  Subjective / 24h Interval events: Feeling better, breathing better.  He denies any chest pain.  Assesement and Plan: Principal Problem:   Diastolic CHF (HCC) Active Problems:   Acute on chronic diastolic CHF (congestive heart failure) (HCC)   Essential hypertension   Stage 3b chronic kidney disease (HCC)   Type 2 diabetes mellitus with hyperlipidemia (HCC)   PAD (peripheral artery disease) (HCC)   Coronary artery disease involving native coronary artery of native heart without angina pectoris   Anemia   Mixed hyperlipidemia   Abscess of left foot   Diabetic foot infection (HCC)   Sacral wound   Pleural effusion   Elevated TSH   Acute on chronic diastolic (congestive) heart failure (HCC)   Malnutrition of moderate degree   Principal problem Acute on chronic diastolic CHF, pulmonary hypertension-most recent 2D echo was in February 2024, showing LVEF 50-55%, RV was normal.  He has some evidence of fluid overload with a pleural effusion, lower extremity edema in the setting of him to be nonadherent to  home diuretics. -Repeat 2D echo showed LVEF 40-45%, mild concentric LVH, indeterminate diastolic parameters and RV function was mildly reduced.  He has mildly elevated PA pressure, improved however from 70 mmHg at the beginning of this year to 51. -Status post IV furosemide, net -2 L.  Weights are inconsistent as they are down in bed -Hold IV furosemide today due to slight increase in creatinine  Active problems Possible community-acquired pneumonia-CT scan of the chest done on admission showed right lower lobe consolidation/atelectasis with moderate volume right pleural effusion.  There is no loculation noted.  There may be some additional patchy airspace disease at the left lung base versus edema.  Has been empirically placed on ceftriaxone and metronidazole, continue for now, plan for total of 7 days -No culture sent unfortunately.  Chest x-ray somewhat unchanged but I appreciate improved aeration  Acute hypoxic respiratory failure-on admission, due to #1 and #2.  Patient is stable on room air with request to have oxygen prescribed for home.  He is willing to self-pay, case management consulted  Chronic kidney disease stage IIIb -baseline creatinine ranging from 1.8-2.5, still close to baseline, creatinine slightly up today  Anemia of chronic renal disease-no bleeding, overall stable  Essential hypertension-continue amlodipine, blood pressure acceptable  Hyperlipidemia-continue statin  Coronary artery disease -with prior PCI in the past, no chest pain, this appears stable  Concern for left foot infection-MRI negative for osteomyelitis or deep infections  DM2, controlled, with hyperglycemia-continue sliding scale  Lab Results  Component Value Date  HGBA1C 6.4 (H) 05/04/2023   CBG (last 3)  Recent Labs    05/07/23 2333 05/08/23 0427 05/08/23 0753  GLUCAP 149* 145* 124*    Scheduled Meds:  amLODipine  10 mg Oral Daily   atorvastatin  40 mg Oral Daily   enoxaparin (LOVENOX)  injection  30 mg Subcutaneous QHS   feeding supplement  237 mL Oral BID BM   Gerhardt's butt cream   Topical BID   guaiFENesin  600 mg Oral BID   hydrocerin   Topical Daily   influenza vaccine adjuvanted  0.5 mL Intramuscular Tomorrow-1000   insulin aspart  0-9 Units Subcutaneous Q4H   isosorbide-hydrALAZINE  2 tablet Oral TID   pneumococcal 20-valent conjugate vaccine  0.5 mL Intramuscular Tomorrow-1000   sodium chloride flush  3 mL Intravenous Q12H   Continuous Infusions:  cefTRIAXone (ROCEPHIN)  IV 2 g (05/07/23 2349)   PRN Meds:.acetaminophen **OR** acetaminophen, albuterol, HYDROcodone-acetaminophen, ondansetron **OR** ondansetron (ZOFRAN) IV, sodium chloride flush  Current Outpatient Medications  Medication Instructions   albuterol (PROVENTIL HFA;VENTOLIN HFA) 108 (90 Base) MCG/ACT inhaler 2 puffs, Inhalation, Every 6 hours PRN   amLODipine (NORVASC) 10 mg, Oral, Daily   aspirin EC 81 mg, Oral, Daily   atorvastatin (LIPITOR) 40 mg, Oral, Daily   ferrous sulfate 325 mg, Oral, Daily with breakfast   HUMALOG MIX 75/25 KWIKPEN (75-25) 100 UNIT/ML Kwikpen 26-36 Units, Subcutaneous, Daily   isosorbide-hydrALAZINE (BIDIL) 20-37.5 MG tablet 2 tablets, Oral, 3 times daily   latanoprost (XALATAN) 0.005 % ophthalmic solution 1 drop, Both Eyes, Daily at bedtime   metoprolol succinate (TOPROL-XL) 100 mg, Oral, Daily, Take with or immediately following a meal.   Multiple Vitamin (MULTIVITAMIN WITH MINERALS) TABS tablet 1 tablet, Oral, Daily   nitroGLYCERIN (NITROSTAT) 0.4 mg, Sublingual, Every 5 min PRN    Diet Orders (From admission, onward)     Start     Ordered   05/05/23 0956  Diet 2 gram sodium Room service appropriate? Yes; Fluid consistency: Thin; Fluid restriction: 1500 mL Fluid  Diet effective now       Question Answer Comment  Room service appropriate? Yes   Fluid consistency: Thin   Fluid restriction: 1500 mL Fluid      05/05/23 0955            DVT prophylaxis:  enoxaparin (LOVENOX) injection 30 mg Start: 05/06/23 2200 SCDs Start: 05/05/23 0053   Lab Results  Component Value Date   PLT 232 05/08/2023      Code Status: Full Code  Family Communication: d/w patient's wife at bedside  Status is: Inpatient   Level of care: Telemetry Cardiac  Consultants:  None  Objective: Vitals:   05/08/23 0003 05/08/23 0409 05/08/23 0539 05/08/23 0730  BP: (!) 144/63 (!) 146/75  (!) 156/77  Pulse: 74 76  79  Resp: 17 17  16   Temp: 98.1 F (36.7 C) 98.6 F (37 C)  98.5 F (36.9 C)  TempSrc: Oral Oral  Oral  SpO2: 97% 98%  96%  Weight:   100.8 kg   Height:        Intake/Output Summary (Last 24 hours) at 05/08/2023 1114 Last data filed at 05/08/2023 0421 Gross per 24 hour  Intake 360 ml  Output 1050 ml  Net -690 ml   Wt Readings from Last 3 Encounters:  05/08/23 100.8 kg  07/04/22 103.1 kg  06/13/22 104.3 kg    Examination: Constitutional: NAD Eyes: lids and conjunctivae normal, no scleral icterus ENMT: mmm Neck:  normal, supple Respiratory: clear to auscultation bilaterally, no wheezing, no crackles. Normal respiratory effort.  Cardiovascular: Regular rate and rhythm, no murmurs / rubs / gallops. No LE edema. Abdomen: soft, no distention, no tenderness. Bowel sounds positive.   Data Reviewed: I have independently reviewed following labs and imaging studies   CBC Recent Labs  Lab 05/04/23 2045 05/04/23 2317 05/05/23 0732 05/06/23 0251 05/08/23 0240  WBC 9.3  --  8.1 8.0 8.1  HGB 9.0* 9.5* 8.6* 7.9* 7.6*  HCT 29.4* 28.0* 27.4* 25.6* 24.8*  PLT 293  --  279 253 232  MCV 110.1*  --  110.5* 110.3* 111.7*  MCH 33.7  --  34.7* 34.1* 34.2*  MCHC 30.6  --  31.4 30.9 30.6  RDW 14.4  --  14.2 14.2 14.4  LYMPHSABS 0.7  --   --   --   --   MONOABS 0.8  --   --   --   --   EOSABS 0.1  --   --   --   --   BASOSABS 0.0  --   --   --   --     Recent Labs  Lab 05/04/23 2040 05/04/23 2045 05/04/23 2047 05/04/23 2302  05/04/23 2305 05/04/23 2317 05/05/23 0732 05/06/23 0251 05/08/23 0240  NA  --  139  --   --   --  139 135 138 136  K  --  4.2  --   --   --  4.2 4.1 4.4 4.8  CL  --  110  --   --   --   --  107 109 107  CO2  --  20*  --   --   --   --  23 21* 22  GLUCOSE  --  206*  --   --   --   --  179* 103* 168*  BUN  --  41*  --   --   --   --  40* 42* 44*  CREATININE  --  2.20*  --   --   --   --  1.93* 2.45* 2.82*  CALCIUM  --  8.7*  --   --   --   --  8.4* 8.3* 8.3*  AST  --  30  --   --   --   --  23 19 26   ALT  --  34  --   --   --   --  29 25 25   ALKPHOS  --  161*  --   --   --   --  143* 136* 162*  BILITOT  --  0.9  --   --   --   --  1.1 0.6 0.5  ALBUMIN  --  2.9*  --   --   --   --  2.8* 2.6* 2.7*  MG  --   --   --   --  2.7*  --  2.5* 2.6* 2.5*  CRP  --   --   --   --  3.4*  --   --   --   --   DDIMER 3.69*  --   --   --   --   --   --   --   --   PROCALCITON  --   --   --   --  <0.10  --   --   --   --   LATICACIDVEN  --   --   --  0.9  --   --   --   --   --   TSH  --   --   --   --  4.587*  --   --   --   --   HGBA1C  --   --   --   --  6.4*  --   --   --   --   BNP  --   --  455.1*  --   --   --   --   --   --     ------------------------------------------------------------------------------------------------------------------ No results for input(s): "CHOL", "HDL", "LDLCALC", "TRIG", "CHOLHDL", "LDLDIRECT" in the last 72 hours.  Lab Results  Component Value Date   HGBA1C 6.4 (H) 05/04/2023   ------------------------------------------------------------------------------------------------------------------ No results for input(s): "TSH", "T4TOTAL", "T3FREE", "THYROIDAB" in the last 72 hours.  Invalid input(s): "FREET3"   Cardiac Enzymes No results for input(s): "CKMB", "TROPONINI", "MYOGLOBIN" in the last 168 hours.  Invalid input(s): "CK" ------------------------------------------------------------------------------------------------------------------    Component  Value Date/Time   BNP 455.1 (H) 05/04/2023 2047    CBG: Recent Labs  Lab 05/07/23 1519 05/07/23 1938 05/07/23 2333 05/08/23 0427 05/08/23 0753  GLUCAP 157* 179* 149* 145* 124*    No results found for this or any previous visit (from the past 240 hours).   Radiology Studies: DG CHEST PORT 1 VIEW Result Date: 05/08/2023 CLINICAL DATA:  Shortness of breath.  Pneumonia. EXAM: PORTABLE CHEST 1 VIEW COMPARISON:  CT chest dated May 05, 2023. FINDINGS: Stable cardiomediastinal silhouette. Moderate right pleural effusion with adjacent right lower lobe collapse/consolidation, not significantly changed. No pneumothorax. No acute osseous abnormality. IMPRESSION: 1. Moderate right pleural effusion with adjacent right lower lobe collapse/consolidation, not significantly changed. Electronically Signed   By: Obie Dredge M.D.   On: 05/08/2023 10:33      Pamella Pert, MD, PhD [facility]  Between 7 am - 7 pm I am available, please contact me via Amion (for emergencies) or Securechat (non urgent messages)  Between 7 pm - 7 am I am not available, please contact night coverage MD/APP via Amion

## 2023-05-08 NOTE — Plan of Care (Signed)

## 2023-05-09 DIAGNOSIS — I5033 Acute on chronic diastolic (congestive) heart failure: Secondary | ICD-10-CM | POA: Diagnosis not present

## 2023-05-09 LAB — GLUCOSE, CAPILLARY
Glucose-Capillary: 135 mg/dL — ABNORMAL HIGH (ref 70–99)
Glucose-Capillary: 141 mg/dL — ABNORMAL HIGH (ref 70–99)
Glucose-Capillary: 122 mg/dL — ABNORMAL HIGH (ref 70–99)
Glucose-Capillary: 160 mg/dL — ABNORMAL HIGH (ref 70–99)
Glucose-Capillary: 175 mg/dL — ABNORMAL HIGH (ref 70–99)
Glucose-Capillary: 167 mg/dL — ABNORMAL HIGH (ref 70–99)

## 2023-05-09 MED ORDER — INSULIN ASPART 100 UNIT/ML IJ SOLN
0.0000 [IU] | Freq: Three times a day (TID) | INTRAMUSCULAR | Status: DC
  Administered 2023-05-09 – 2023-05-10 (×4): 2 [IU] via SUBCUTANEOUS
  Administered 2023-05-10: 1 [IU] via SUBCUTANEOUS
  Administered 2023-05-11 (×3): 2 [IU] via SUBCUTANEOUS

## 2023-05-09 MED ORDER — FUROSEMIDE 10 MG/ML IJ SOLN
40.0000 mg | Freq: Once | INTRAMUSCULAR | Status: AC
  Administered 2023-05-09: 40 mg via INTRAVENOUS
  Filled 2023-05-09: qty 4

## 2023-05-09 NOTE — Progress Notes (Signed)
PROGRESS NOTE  David [name] WGN:[id number] DOB: Jul 11, 1945 DOA: 05/04/2023 PCP: Renford Dills, MD   LOS: 4 days   Brief Narrative / Interim history: 77 year old male with history of CAD, chronic diastolic CHF, pulmonary hypertension comes into the hospital with shortness of breath.  Patient is not reliable historian but I was able to speak with his wife, she reports that he has been more more dyspneic over the last several days/1 to 2 weeks.  She reports that he is compliant with all medication except for home torsemide that he does not like to take because he is urinating too much.  There is also reports of worsening lower extremity edema.  He is not a smoker, does not drink.  Vitals are stable in the ER, he was placed on supplemental oxygen but not sure whether he was truly hypoxic or not.  Chest x-ray on admission showed moderate right pleural effusion with atelectasis/pneumonia  Subjective / 24h Interval events: Feeling better, breathing better.  No chest pain  Assesement and Plan: Principal Problem:   Diastolic CHF (HCC) Active Problems:   Acute on chronic diastolic CHF (congestive heart failure) (HCC)   Essential hypertension   Stage 3b chronic kidney disease (HCC)   Type 2 diabetes mellitus with hyperlipidemia (HCC)   PAD (peripheral artery disease) (HCC)   Coronary artery disease involving native coronary artery of native heart without angina pectoris   Anemia   Mixed hyperlipidemia   Abscess of left foot   Diabetic foot infection (HCC)   Sacral wound   Pleural effusion   Elevated TSH   Acute on chronic diastolic (congestive) heart failure (HCC)   Malnutrition of moderate degree   Principal problem Acute on chronic diastolic CHF, pulmonary hypertension-most recent 2D echo was in February 2024, showing LVEF 50-55%, RV was normal.  He has some evidence of fluid overload with a pleural effusion, lower extremity edema in the setting of him to be nonadherent to home  diuretics. -Repeat 2D echo showed LVEF 40-45%, mild concentric LVH, indeterminate diastolic parameters and RV function was mildly reduced.  He has mildly elevated PA pressure, improved however from 70 mmHg at the beginning of this year to 51. -Status post IV furosemide, net negative about 2 L.  Weights are inconsistent as they are down in bed -Redose furosemide again today  Active problems Possible community-acquired pneumonia-CT scan of the chest done on admission showed right lower lobe consolidation/atelectasis with moderate volume right pleural effusion.  There is no loculation noted.  There may be some additional patchy airspace disease at the left lung base versus edema.  Has been empirically placed on ceftriaxone and metronidazole, continue for now, plan for total of 7 days -No culture sent unfortunately.  Chest x-ray somewhat unchanged but I appreciate improved aeration -I offered patient thoracentesis to remove that fluid from the right side however he declined.  Acute hypoxic respiratory failure-on admission, due to #1 and #2.  Patient is stable on room air with request to have oxygen prescribed for home.  He is willing to self-pay, case management consulted  Chronic kidney disease stage IIIb -baseline creatinine ranging from 1.8-2.5, still close to baseline, creatinine slightly up today but suspect this is actually his new baseline  Anemia of chronic renal disease-no bleeding, overall stable  Essential hypertension-continue amlodipine, blood pressure acceptable  Hyperlipidemia-continue statin  Coronary artery disease -with prior PCI in the past, no chest pain, this appears stable  Concern for left foot infection-MRI negative for osteomyelitis or deep  infections  DM2, controlled, with hyperglycemia-continue sliding scale  Lab Results  Component Value Date   HGBA1C 6.4 (H) 05/04/2023   CBG (last 3)  Recent Labs    05/08/23 1929 05/09/23 0109 05/09/23 0501  GLUCAP 201*  135* 141*    Scheduled Meds:  amLODipine  10 mg Oral Daily   atorvastatin  40 mg Oral Daily   enoxaparin (LOVENOX) injection  30 mg Subcutaneous QHS   feeding supplement  237 mL Oral BID BM   furosemide  40 mg Intravenous Once   Gerhardt's butt cream   Topical BID   guaiFENesin  600 mg Oral BID   hydrocerin   Topical Daily   influenza vaccine adjuvanted  0.5 mL Intramuscular Tomorrow-1000   insulin aspart  0-9 Units Subcutaneous TID AC & HS   isosorbide-hydrALAZINE  2 tablet Oral TID   pneumococcal 20-valent conjugate vaccine  0.5 mL Intramuscular Tomorrow-1000   sodium chloride flush  3 mL Intravenous Q12H   Continuous Infusions:  cefTRIAXone (ROCEPHIN)  IV 2 g (05/08/23 2135)   PRN Meds:.acetaminophen **OR** acetaminophen, albuterol, HYDROcodone-acetaminophen, ondansetron **OR** ondansetron (ZOFRAN) IV, sodium chloride flush  Current Outpatient Medications  Medication Instructions   albuterol (PROVENTIL HFA;VENTOLIN HFA) 108 (90 Base) MCG/ACT inhaler 2 puffs, Inhalation, Every 6 hours PRN   amLODipine (NORVASC) 10 mg, Oral, Daily   aspirin EC 81 mg, Oral, Daily   atorvastatin (LIPITOR) 40 mg, Oral, Daily   ferrous sulfate 325 mg, Oral, Daily with breakfast   HUMALOG MIX 75/25 KWIKPEN (75-25) 100 UNIT/ML Kwikpen 26-36 Units, Subcutaneous, Daily   isosorbide-hydrALAZINE (BIDIL) 20-37.5 MG tablet 2 tablets, Oral, 3 times daily   latanoprost (XALATAN) 0.005 % ophthalmic solution 1 drop, Both Eyes, Daily at bedtime   metoprolol succinate (TOPROL-XL) 100 mg, Oral, Daily, Take with or immediately following a meal.   Multiple Vitamin (MULTIVITAMIN WITH MINERALS) TABS tablet 1 tablet, Oral, Daily   nitroGLYCERIN (NITROSTAT) 0.4 mg, Sublingual, Every 5 min PRN    Diet Orders (From admission, onward)     Start     Ordered   05/05/23 0956  Diet 2 gram sodium Room service appropriate? Yes; Fluid consistency: Thin; Fluid restriction: 1500 mL Fluid  Diet effective now       Question  Answer Comment  Room service appropriate? Yes   Fluid consistency: Thin   Fluid restriction: 1500 mL Fluid      05/05/23 0955            DVT prophylaxis: enoxaparin (LOVENOX) injection 30 mg Start: 05/06/23 2200 SCDs Start: 05/05/23 0053   Lab Results  Component Value Date   PLT 232 05/08/2023      Code Status: Full Code  Family Communication: d/w patient's wife at bedside  Status is: Inpatient   Level of care: Telemetry Cardiac  Consultants:  None  Objective: Vitals:   05/08/23 1532 05/08/23 1932 05/09/23 0112 05/09/23 0502  BP: (!) 152/70 130/68 (!) 145/69 (!) 152/72  Pulse: 74 67 75 77  Resp: 16 18 19 20   Temp: 98.7 F (37.1 C) 98.6 F (37 C) 98.3 F (36.8 C) 98.7 F (37.1 C)  TempSrc: Oral Oral Oral Oral  SpO2: 98% 100% 97% 99%  Weight:      Height:        Intake/Output Summary (Last 24 hours) at 05/09/2023 0931 Last data filed at 05/08/2023 1844 Gross per 24 hour  Intake 60 ml  Output --  Net 60 ml   Wt Readings from Last 3 Encounters:  05/08/23 100.8 kg  07/04/22 103.1 kg  06/13/22 104.3 kg    Examination: Constitutional: NAD Eyes: lids and conjunctivae normal, no scleral icterus ENMT: mmm Neck: normal, supple Respiratory: clear to auscultation bilaterally, no wheezing, no crackles. Normal respiratory effort.  Cardiovascular: Regular rate and rhythm, no murmurs / rubs / gallops. No LE edema. Abdomen: soft, no distention, no tenderness. Bowel sounds positive.  Skin: no rashes Neurologic: no focal deficits, equal strength  Data Reviewed: I have independently reviewed following labs and imaging studies   CBC Recent Labs  Lab 05/04/23 2045 05/04/23 2317 05/05/23 0732 05/06/23 0251 05/08/23 0240  WBC 9.3  --  8.1 8.0 8.1  HGB 9.0* 9.5* 8.6* 7.9* 7.6*  HCT 29.4* 28.0* 27.4* 25.6* 24.8*  PLT 293  --  279 253 232  MCV 110.1*  --  110.5* 110.3* 111.7*  MCH 33.7  --  34.7* 34.1* 34.2*  MCHC 30.6  --  31.4 30.9 30.6  RDW 14.4  --   14.2 14.2 14.4  LYMPHSABS 0.7  --   --   --   --   MONOABS 0.8  --   --   --   --   EOSABS 0.1  --   --   --   --   BASOSABS 0.0  --   --   --   --     Recent Labs  Lab 05/04/23 2040 05/04/23 2045 05/04/23 2047 05/04/23 2302 05/04/23 2305 05/04/23 2317 05/05/23 0732 05/06/23 0251 05/08/23 0240  NA  --  139  --   --   --  139 135 138 136  K  --  4.2  --   --   --  4.2 4.1 4.4 4.8  CL  --  110  --   --   --   --  107 109 107  CO2  --  20*  --   --   --   --  23 21* 22  GLUCOSE  --  206*  --   --   --   --  179* 103* 168*  BUN  --  41*  --   --   --   --  40* 42* 44*  CREATININE  --  2.20*  --   --   --   --  1.93* 2.45* 2.82*  CALCIUM  --  8.7*  --   --   --   --  8.4* 8.3* 8.3*  AST  --  30  --   --   --   --  23 19 26   ALT  --  34  --   --   --   --  29 25 25   ALKPHOS  --  161*  --   --   --   --  143* 136* 162*  BILITOT  --  0.9  --   --   --   --  1.1 0.6 0.5  ALBUMIN  --  2.9*  --   --   --   --  2.8* 2.6* 2.7*  MG  --   --   --   --  2.7*  --  2.5* 2.6* 2.5*  CRP  --   --   --   --  3.4*  --   --   --   --   DDIMER 3.69*  --   --   --   --   --   --   --   --  PROCALCITON  --   --   --   --  <0.10  --   --   --   --   LATICACIDVEN  --   --   --  0.9  --   --   --   --   --   TSH  --   --   --   --  4.587*  --   --   --   --   HGBA1C  --   --   --   --  6.4*  --   --   --   --   BNP  --   --  455.1*  --   --   --   --   --   --     ------------------------------------------------------------------------------------------------------------------ No results for input(s): "CHOL", "HDL", "LDLCALC", "TRIG", "CHOLHDL", "LDLDIRECT" in the last 72 hours.  Lab Results  Component Value Date   HGBA1C 6.4 (H) 05/04/2023   ------------------------------------------------------------------------------------------------------------------ No results for input(s): "TSH", "T4TOTAL", "T3FREE", "THYROIDAB" in the last 72 hours.  Invalid input(s): "FREET3"   Cardiac Enzymes No  results for input(s): "CKMB", "TROPONINI", "MYOGLOBIN" in the last 168 hours.  Invalid input(s): "CK" ------------------------------------------------------------------------------------------------------------------    Component Value Date/Time   BNP 455.1 (H) 05/04/2023 2047    CBG: Recent Labs  Lab 05/08/23 1125 05/08/23 1535 05/08/23 1929 05/09/23 0109 05/09/23 0501  GLUCAP 147* 167* 201* 135* 141*    No results found for this or any previous visit (from the past 240 hours).   Radiology Studies: No results found.     Pamella Pert, MD, PhD [facility]  Between 7 am - 7 pm I am available, please contact me via Amion (for emergencies) or Securechat (non urgent messages)  Between 7 pm - 7 am I am not available, please contact night coverage MD/APP via Amion

## 2023-05-09 NOTE — Plan of Care (Signed)
Pt still has edema to lower extremities and bilateral arms

## 2023-05-10 DIAGNOSIS — I5033 Acute on chronic diastolic (congestive) heart failure: Secondary | ICD-10-CM | POA: Diagnosis not present

## 2023-05-10 LAB — GLUCOSE, CAPILLARY
Glucose-Capillary: 147 mg/dL — ABNORMAL HIGH (ref 70–99)
Glucose-Capillary: 181 mg/dL — ABNORMAL HIGH (ref 70–99)
Glucose-Capillary: 198 mg/dL — ABNORMAL HIGH (ref 70–99)

## 2023-05-10 MED ORDER — POLYETHYLENE GLYCOL 3350 17 G PO PACK
17.0000 g | PACK | Freq: Every day | ORAL | Status: DC | PRN
  Administered 2023-05-10: 17 g via ORAL
  Filled 2023-05-10: qty 1

## 2023-05-10 MED ORDER — ALPRAZOLAM 0.5 MG PO TABS
1.0000 mg | ORAL_TABLET | Freq: Every evening | ORAL | Status: DC | PRN
  Administered 2023-05-10: 1 mg via ORAL
  Filled 2023-05-10: qty 2

## 2023-05-10 NOTE — Progress Notes (Signed)
PROGRESS NOTE  David [name] WNU:[id number] DOB: [birth year] DOA: 05/04/2023 PCP: Renford Dills, MD   LOS: 5 days   Brief Narrative / Interim history: 77 year old male with history of CAD, chronic diastolic CHF, pulmonary hypertension comes into the hospital with shortness of breath.  Patient is not reliable historian but I was able to speak with his wife, she reports that he has been more more dyspneic over the last several days/1 to 2 weeks.  She reports that he is compliant with all medication except for home torsemide that he does not like to take because he is urinating too much.  There is also reports of worsening lower extremity edema.  He is not a smoker, does not drink.  Vitals are stable in the ER, he was placed on supplemental oxygen but not sure whether he was truly hypoxic or not.  Chest x-ray on admission showed moderate right pleural effusion with atelectasis/pneumonia  Subjective / 24h Interval events: Feeling better, breathing better.  Has been coughing up more but still unable to expectorate so much  Assesement and Plan: Principal Problem:   Diastolic CHF (HCC) Active Problems:   Acute on chronic diastolic CHF (congestive heart failure) (HCC)   Essential hypertension   Stage 3b chronic kidney disease (HCC)   Type 2 diabetes mellitus with hyperlipidemia (HCC)   PAD (peripheral artery disease) (HCC)   Coronary artery disease involving native coronary artery of native heart without angina pectoris   Anemia   Mixed hyperlipidemia   Abscess of left foot   Diabetic foot infection (HCC)   Sacral wound   Pleural effusion   Elevated TSH   Acute on chronic diastolic (congestive) heart failure (HCC)   Malnutrition of moderate degree   Principal problem Acute on chronic diastolic CHF, pulmonary hypertension-most recent 2D echo was in February 2024, showing LVEF 50-55%, RV was normal.  He has some evidence of fluid overload with a pleural effusion, lower extremity edema in  the setting of him to be nonadherent to home diuretics. -Repeat 2D echo showed LVEF 40-45%, mild concentric LVH, indeterminate diastolic parameters and RV function was mildly reduced.  He has mildly elevated PA pressure, improved however from 70 mmHg at the beginning of this year to 51. -Status post intermittent furosemide, with difficulties given refusal to get blood work on certain days, he is net negative about 3 L and clinically improved..  Weights are inconsistent as they are taken in bed -Recheck renal function tomorrow morning if he will allow  Active problems Possible community-acquired pneumonia-CT scan of the chest done on admission showed right lower lobe consolidation/atelectasis with moderate volume right pleural effusion.  There is no loculation noted.  There may be some additional patchy airspace disease at the left lung base versus edema.  Has been empirically placed on ceftriaxone and metronidazole, continue for now, plan for total of 7 days.  He does have a productive cough -No culture sent unfortunately.  -I offered patient thoracentesis to remove that fluid from the right side to help his breathing even better, however he declined.  Acute hypoxic respiratory failure-on admission, due to #1 and #2.  This is improved, he is stable on room air however he chooses to use oxygen as he feels better with oxygen on despite satting in the 90s on room air.  He agrees to go to SNF and when wanted to go home, he was agreeable to pay out-of-pocket for oxygen just so that he has it for comfort  Chronic kidney  disease stage IIIb -baseline creatinine ranging from 1.8-2.5, most recent one 2.8 after furosemide, will attempt to recheck labs tomorrow morning if he will allow  Anemia of chronic renal disease-no bleeding, overall stable.  Attempt CBC tomorrow morning  Essential hypertension-continue amlodipine, blood pressure is stable  Hyperlipidemia-continue statin  Coronary artery disease -with  prior PCI in the past, no chest pain, this appears stable  Concern for left foot infection-MRI negative for osteomyelitis or deep infections  DM2, controlled, with hyperglycemia-continue sliding scale  Lab Results  Component Value Date   HGBA1C 6.4 (H) 05/04/2023   CBG (last 3)  Recent Labs    05/09/23 1747 05/09/23 2135 05/10/23 0613  GLUCAP 175* 167* 147*   Disposition-now agreeable to SNF, wife picked up place, insurance authorization pending  Scheduled Meds:  amLODipine  10 mg Oral Daily   atorvastatin  40 mg Oral Daily   enoxaparin (LOVENOX) injection  30 mg Subcutaneous QHS   feeding supplement  237 mL Oral BID BM   Gerhardt's butt cream   Topical BID   guaiFENesin  600 mg Oral BID   hydrocerin   Topical Daily   influenza vaccine adjuvanted  0.5 mL Intramuscular Tomorrow-1000   insulin aspart  0-9 Units Subcutaneous TID AC & HS   isosorbide-hydrALAZINE  2 tablet Oral TID   pneumococcal 20-valent conjugate vaccine  0.5 mL Intramuscular Tomorrow-1000   sodium chloride flush  3 mL Intravenous Q12H   Continuous Infusions:  cefTRIAXone (ROCEPHIN)  IV Stopped (05/10/23 0236)   PRN Meds:.acetaminophen **OR** acetaminophen, albuterol, ALPRAZolam, HYDROcodone-acetaminophen, ondansetron **OR** ondansetron (ZOFRAN) IV, sodium chloride flush  Current Outpatient Medications  Medication Instructions   albuterol (PROVENTIL HFA;VENTOLIN HFA) 108 (90 Base) MCG/ACT inhaler 2 puffs, Inhalation, Every 6 hours PRN   amLODipine (NORVASC) 10 mg, Oral, Daily   aspirin EC 81 mg, Oral, Daily   atorvastatin (LIPITOR) 40 mg, Oral, Daily   ferrous sulfate 325 mg, Oral, Daily with breakfast   HUMALOG MIX 75/25 KWIKPEN (75-25) 100 UNIT/ML Kwikpen 26-36 Units, Subcutaneous, Daily   isosorbide-hydrALAZINE (BIDIL) 20-37.5 MG tablet 2 tablets, Oral, 3 times daily   latanoprost (XALATAN) 0.005 % ophthalmic solution 1 drop, Both Eyes, Daily at bedtime   metoprolol succinate (TOPROL-XL) 100 mg,  Oral, Daily, Take with or immediately following a meal.   Multiple Vitamin (MULTIVITAMIN WITH MINERALS) TABS tablet 1 tablet, Oral, Daily   nitroGLYCERIN (NITROSTAT) 0.4 mg, Sublingual, Every 5 min PRN    Diet Orders (From admission, onward)     Start     Ordered   05/05/23 0956  Diet 2 gram sodium Room service appropriate? Yes; Fluid consistency: Thin; Fluid restriction: 1500 mL Fluid  Diet effective now       Question Answer Comment  Room service appropriate? Yes   Fluid consistency: Thin   Fluid restriction: 1500 mL Fluid      05/05/23 0955            DVT prophylaxis: enoxaparin (LOVENOX) injection 30 mg Start: 05/06/23 2200 SCDs Start: 05/05/23 0053   Lab Results  Component Value Date   PLT 232 05/08/2023      Code Status: Full Code  Family Communication: No family at bedside  Status is: Inpatient   Level of care: Telemetry Cardiac  Consultants:  None  Objective: Vitals:   05/09/23 1532 05/09/23 1935 05/10/23 0002 05/10/23 0500  BP: (!) 152/76 129/66 132/63 139/68  Pulse: 78 62 66   Resp: 18 17 18 20   Temp: 99.1 F (37.3  C) 98.9 F (37.2 C) 98.7 F (37.1 C) 98.6 F (37 C)  TempSrc: Oral Oral Oral Oral  SpO2: 98% 97% 99% 96%  Weight:    101.3 kg  Height:        Intake/Output Summary (Last 24 hours) at 05/10/2023 1401 Last data filed at 05/10/2023 0500 Gross per 24 hour  Intake 340 ml  Output 1270 ml  Net -930 ml   Wt Readings from Last 3 Encounters:  05/10/23 101.3 kg  07/04/22 103.1 kg  06/13/22 104.3 kg    Examination: Constitutional: NAD Eyes: lids and conjunctivae normal, no scleral icterus ENMT: mmm Neck: normal, supple Respiratory: clear to auscultation bilaterally, no wheezing, no crackles. Normal respiratory effort.  Cardiovascular: Regular rate and rhythm, no murmurs / rubs / gallops. Trace LE edema. Abdomen: soft, no distention, no tenderness. Bowel sounds positive.   Data Reviewed: I have independently reviewed following  labs and imaging studies   CBC Recent Labs  Lab 05/04/23 2045 05/04/23 2317 05/05/23 0732 05/06/23 0251 05/08/23 0240  WBC 9.3  --  8.1 8.0 8.1  HGB 9.0* 9.5* 8.6* 7.9* 7.6*  HCT 29.4* 28.0* 27.4* 25.6* 24.8*  PLT 293  --  279 253 232  MCV 110.1*  --  110.5* 110.3* 111.7*  MCH 33.7  --  34.7* 34.1* 34.2*  MCHC 30.6  --  31.4 30.9 30.6  RDW 14.4  --  14.2 14.2 14.4  LYMPHSABS 0.7  --   --   --   --   MONOABS 0.8  --   --   --   --   EOSABS 0.1  --   --   --   --   BASOSABS 0.0  --   --   --   --     Recent Labs  Lab 05/04/23 2040 05/04/23 2045 05/04/23 2047 05/04/23 2302 05/04/23 2305 05/04/23 2317 05/05/23 0732 05/06/23 0251 05/08/23 0240  NA  --  139  --   --   --  139 135 138 136  K  --  4.2  --   --   --  4.2 4.1 4.4 4.8  CL  --  110  --   --   --   --  107 109 107  CO2  --  20*  --   --   --   --  23 21* 22  GLUCOSE  --  206*  --   --   --   --  179* 103* 168*  BUN  --  41*  --   --   --   --  40* 42* 44*  CREATININE  --  2.20*  --   --   --   --  1.93* 2.45* 2.82*  CALCIUM  --  8.7*  --   --   --   --  8.4* 8.3* 8.3*  AST  --  30  --   --   --   --  23 19 26   ALT  --  34  --   --   --   --  29 25 25   ALKPHOS  --  161*  --   --   --   --  143* 136* 162*  BILITOT  --  0.9  --   --   --   --  1.1 0.6 0.5  ALBUMIN  --  2.9*  --   --   --   --  2.8* 2.6* 2.7*  MG  --   --   --   --  2.7*  --  2.5* 2.6* 2.5*  CRP  --   --   --   --  3.4*  --   --   --   --   DDIMER 3.69*  --   --   --   --   --   --   --   --   PROCALCITON  --   --   --   --  <0.10  --   --   --   --   LATICACIDVEN  --   --   --  0.9  --   --   --   --   --   TSH  --   --   --   --  4.587*  --   --   --   --   HGBA1C  --   --   --   --  6.4*  --   --   --   --   BNP  --   --  455.1*  --   --   --   --   --   --     ------------------------------------------------------------------------------------------------------------------ No results for input(s): "CHOL", "HDL", "LDLCALC", "TRIG",  "CHOLHDL", "LDLDIRECT" in the last 72 hours.  Lab Results  Component Value Date   HGBA1C 6.4 (H) 05/04/2023   ------------------------------------------------------------------------------------------------------------------ No results for input(s): "TSH", "T4TOTAL", "T3FREE", "THYROIDAB" in the last 72 hours.  Invalid input(s): "FREET3"   Cardiac Enzymes No results for input(s): "CKMB", "TROPONINI", "MYOGLOBIN" in the last 168 hours.  Invalid input(s): "CK" ------------------------------------------------------------------------------------------------------------------    Component Value Date/Time   BNP 455.1 (H) 05/04/2023 2047    CBG: Recent Labs  Lab 05/09/23 0733 05/09/23 1104 05/09/23 1747 05/09/23 2135 05/10/23 0613  GLUCAP 122* 160* 175* 167* 147*    No results found for this or any previous visit (from the past 240 hours).   Radiology Studies: No results found.     Pamella Pert, MD, PhD [facility]  Between 7 am - 7 pm I am available, please contact me via Amion (for emergencies) or Securechat (non urgent messages)  Between 7 pm - 7 am I am not available, please contact night coverage MD/APP via Amion

## 2023-05-10 NOTE — Plan of Care (Signed)
  Problem: Elimination: Goal: Will not experience complications related to bowel motility Outcome: Progressing Goal: Will not experience complications related to urinary retention Outcome: Progressing   Problem: Safety: Goal: Ability to remain free from injury will improve Outcome: Progressing   

## 2023-05-10 NOTE — TOC Progression Note (Signed)
Transition of Care [facility]) - Progression Note    Patient Details  Name: David [name] MRN: [id number] Date of Birth: 10/21/1945  [facility] of [facility] CM/SW Contact  Jaydyn Menon A [place], [place] Phone Number: 05/10/2023, 9:16 AM  Clinical Narrative:     CSW was informed by provider that pt is medically stable for DC and that pt and pt's wife selected [facility] for rehab.   CSW followed up with facility, can accept pt today if authorization is approved.   CSW started authorization request for insurance. Auth status pending. Reference ID: [id number]. Provider notified.    TOC will continue to follow.   Expected Discharge Plan: Skilled Nursing Facility Barriers to Discharge: Continued Medical Work up, Other (must enter comment) (waiting on family decision of snf)  Expected Discharge Plan and Services   Discharge Planning Services: CM Consult Post Acute Care Choice: Durable Medical Equipment, Home Health Living arrangements for the past 2 months: Single Family Home                 DME Arranged: Oxygen DME Agency: [employer]       HH Arranged: RN, PT, OT [facility] Agency: [facility] Date [facility] Agency Contacted: 05/06/23 Time HH Agency Contacted: 1300 Representative spoke with at [facility] Agency: Tresa Endo   Social Determinants of Health (SDOH) Interventions SDOH Screenings   Food Insecurity: No Food Insecurity (05/05/2023)  Housing: Low Risk  (05/05/2023)  Transportation Needs: No Transportation Needs (05/05/2023)  Recent Concern: Transportation Needs - Unmet Transportation Needs (05/05/2023)  Utilities: Not At Risk (05/05/2023)  Financial Resource Strain: Low Risk  (08/20/2017)  Physical Activity: Inactive (08/20/2017)  Social Connections: Socially Integrated (12/19/2017)  Stress: No Stress Concern Present (08/20/2017)  Tobacco Use: Medium Risk (05/05/2023)    Readmission Risk Interventions     No data to display

## 2023-05-10 NOTE — Progress Notes (Signed)
Patient kept on  refusing to be repositioned every 2 hours and off load the heels. Patient education provided.

## 2023-05-11 ENCOUNTER — Other Ambulatory Visit: Payer: Self-pay

## 2023-05-11 ENCOUNTER — Emergency Department (HOSPITAL_COMMUNITY): Payer: Medicare Other

## 2023-05-11 ENCOUNTER — Inpatient Hospital Stay (HOSPITAL_COMMUNITY)
Admission: EM | Admit: 2023-05-11 | Discharge: 2023-05-25 | DRG: 208 | Disposition: A | Discharge status: Home Health | Payer: Medicare Other | Attending: Internal Medicine | Admitting: Internal Medicine

## 2023-05-11 DIAGNOSIS — N184 Chronic kidney disease, stage 4 (severe): Secondary | ICD-10-CM | POA: Diagnosis present

## 2023-05-11 DIAGNOSIS — J984 Other disorders of lung: Secondary | ICD-10-CM | POA: Diagnosis not present

## 2023-05-11 DIAGNOSIS — J9621 Acute and chronic respiratory failure with hypoxia: Principal | ICD-10-CM | POA: Diagnosis present

## 2023-05-11 DIAGNOSIS — Z4682 Encounter for fitting and adjustment of non-vascular catheter: Secondary | ICD-10-CM | POA: Diagnosis not present

## 2023-05-11 DIAGNOSIS — I5033 Acute on chronic diastolic (congestive) heart failure: Secondary | ICD-10-CM | POA: Diagnosis not present

## 2023-05-11 DIAGNOSIS — Z89422 Acquired absence of other left toe(s): Secondary | ICD-10-CM

## 2023-05-11 DIAGNOSIS — D631 Anemia in chronic kidney disease: Secondary | ICD-10-CM | POA: Diagnosis not present

## 2023-05-11 DIAGNOSIS — J90 Pleural effusion, not elsewhere classified: Secondary | ICD-10-CM | POA: Diagnosis present

## 2023-05-11 DIAGNOSIS — Z8249 Family history of ischemic heart disease and other diseases of the circulatory system: Secondary | ICD-10-CM | POA: Diagnosis not present

## 2023-05-11 DIAGNOSIS — Z1152 Encounter for screening for COVID-19: Secondary | ICD-10-CM | POA: Diagnosis not present

## 2023-05-11 DIAGNOSIS — Z5982 Transportation insecurity: Secondary | ICD-10-CM | POA: Diagnosis not present

## 2023-05-11 DIAGNOSIS — I5043 Acute on chronic combined systolic (congestive) and diastolic (congestive) heart failure: Secondary | ICD-10-CM

## 2023-05-11 DIAGNOSIS — I440 Atrioventricular block, first degree: Secondary | ICD-10-CM | POA: Diagnosis not present

## 2023-05-11 DIAGNOSIS — Y95 Nosocomial condition: Secondary | ICD-10-CM | POA: Diagnosis not present

## 2023-05-11 DIAGNOSIS — Z87891 Personal history of nicotine dependence: Secondary | ICD-10-CM | POA: Diagnosis not present

## 2023-05-11 DIAGNOSIS — E1140 Type 2 diabetes mellitus with diabetic neuropathy, unspecified: Secondary | ICD-10-CM | POA: Diagnosis present

## 2023-05-11 DIAGNOSIS — E1122 Type 2 diabetes mellitus with diabetic chronic kidney disease: Secondary | ICD-10-CM | POA: Diagnosis not present

## 2023-05-11 DIAGNOSIS — N179 Acute kidney failure, unspecified: Secondary | ICD-10-CM | POA: Diagnosis present

## 2023-05-11 DIAGNOSIS — Z96641 Presence of right artificial hip joint: Secondary | ICD-10-CM | POA: Diagnosis not present

## 2023-05-11 DIAGNOSIS — H409 Unspecified glaucoma: Secondary | ICD-10-CM | POA: Diagnosis not present

## 2023-05-11 DIAGNOSIS — I272 Pulmonary hypertension, unspecified: Secondary | ICD-10-CM | POA: Diagnosis not present

## 2023-05-11 DIAGNOSIS — Z79899 Other long term (current) drug therapy: Secondary | ICD-10-CM

## 2023-05-11 DIAGNOSIS — R001 Bradycardia, unspecified: Secondary | ICD-10-CM | POA: Diagnosis not present

## 2023-05-11 DIAGNOSIS — Z7982 Long term (current) use of aspirin: Secondary | ICD-10-CM

## 2023-05-11 DIAGNOSIS — I130 Hypertensive heart and chronic kidney disease with heart failure and stage 1 through stage 4 chronic kidney disease, or unspecified chronic kidney disease: Principal | ICD-10-CM | POA: Diagnosis present

## 2023-05-11 DIAGNOSIS — J9601 Acute respiratory failure with hypoxia: Secondary | ICD-10-CM | POA: Diagnosis present

## 2023-05-11 DIAGNOSIS — E785 Hyperlipidemia, unspecified: Secondary | ICD-10-CM | POA: Diagnosis not present

## 2023-05-11 DIAGNOSIS — E11319 Type 2 diabetes mellitus with unspecified diabetic retinopathy without macular edema: Secondary | ICD-10-CM | POA: Diagnosis not present

## 2023-05-11 DIAGNOSIS — I509 Heart failure, unspecified: Principal | ICD-10-CM

## 2023-05-11 DIAGNOSIS — Z89411 Acquired absence of right great toe: Secondary | ICD-10-CM

## 2023-05-11 DIAGNOSIS — J918 Pleural effusion in other conditions classified elsewhere: Secondary | ICD-10-CM | POA: Diagnosis present

## 2023-05-11 DIAGNOSIS — Z955 Presence of coronary angioplasty implant and graft: Secondary | ICD-10-CM

## 2023-05-11 DIAGNOSIS — Z794 Long term (current) use of insulin: Secondary | ICD-10-CM

## 2023-05-11 DIAGNOSIS — Z8349 Family history of other endocrine, nutritional and metabolic diseases: Secondary | ICD-10-CM

## 2023-05-11 DIAGNOSIS — M5030 Other cervical disc degeneration, unspecified cervical region: Secondary | ICD-10-CM | POA: Diagnosis not present

## 2023-05-11 DIAGNOSIS — I110 Hypertensive heart disease with heart failure: Secondary | ICD-10-CM | POA: Diagnosis not present

## 2023-05-11 DIAGNOSIS — I2510 Atherosclerotic heart disease of native coronary artery without angina pectoris: Secondary | ICD-10-CM | POA: Diagnosis not present

## 2023-05-11 DIAGNOSIS — J189 Pneumonia, unspecified organism: Secondary | ICD-10-CM | POA: Diagnosis not present

## 2023-05-11 DIAGNOSIS — R680 Hypothermia, not associated with low environmental temperature: Secondary | ICD-10-CM | POA: Diagnosis present

## 2023-05-11 DIAGNOSIS — I10 Essential (primary) hypertension: Secondary | ICD-10-CM

## 2023-05-11 DIAGNOSIS — Z833 Family history of diabetes mellitus: Secondary | ICD-10-CM

## 2023-05-11 DIAGNOSIS — R140 Abdominal distension (gaseous): Secondary | ICD-10-CM | POA: Diagnosis not present

## 2023-05-11 DIAGNOSIS — Z89421 Acquired absence of other right toe(s): Secondary | ICD-10-CM

## 2023-05-11 LAB — CBC
HCT: 23.8 % — ABNORMAL LOW (ref 39.0–52.0)
Hemoglobin: 7.2 g/dL — ABNORMAL LOW (ref 13.0–17.0)
MCH: 34 pg (ref 26.0–34.0)
MCHC: 30.3 g/dL (ref 30.0–36.0)
MCV: 112.3 fL — ABNORMAL HIGH (ref 80.0–100.0)
Platelets: 202 10*3/uL (ref 150–400)
RBC: 2.12 MIL/uL — ABNORMAL LOW (ref 4.22–5.81)
RDW: 13.6 % (ref 11.5–15.5)
WBC: 6.9 10*3/uL (ref 4.0–10.5)
nRBC: 0 % (ref 0.0–0.2)

## 2023-05-11 LAB — CBC WITH DIFFERENTIAL/PLATELET

## 2023-05-11 LAB — BASIC METABOLIC PANEL
Anion gap: 6 (ref 5–15)
BUN: 52 mg/dL — ABNORMAL HIGH (ref 8–23)
CO2: 23 mmol/L (ref 22–32)
Calcium: 8.5 mg/dL — ABNORMAL LOW (ref 8.9–10.3)
Chloride: 106 mmol/L (ref 98–111)
Creatinine, Ser: 2.87 mg/dL — ABNORMAL HIGH (ref 0.61–1.24)
GFR, Estimated: 22 mL/min — ABNORMAL LOW (ref >60)
Glucose, Bld: 182 mg/dL — ABNORMAL HIGH (ref 70–99)
Potassium: 4.9 mmol/L (ref 3.5–5.1)
Sodium: 135 mmol/L (ref 135–145)

## 2023-05-11 LAB — GLUCOSE, CAPILLARY
Glucose-Capillary: 165 mg/dL — ABNORMAL HIGH (ref 70–99)
Glucose-Capillary: 183 mg/dL — ABNORMAL HIGH (ref 70–99)
Glucose-Capillary: 182 mg/dL — ABNORMAL HIGH (ref 70–99)

## 2023-05-11 LAB — I-STAT CG4 LACTIC ACID, ED: Lactic Acid, Venous: 1.5 mmol/L (ref 0.5–1.9)

## 2023-05-11 LAB — APTT: aPTT: 44 s — ABNORMAL HIGH (ref 24–36)

## 2023-05-11 LAB — CBG MONITORING, ED: Glucose-Capillary: 168 mg/dL — ABNORMAL HIGH (ref 70–99)

## 2023-05-11 LAB — MAGNESIUM: Magnesium: 2.6 mg/dL — ABNORMAL HIGH (ref 1.7–2.4)

## 2023-05-11 LAB — PROTIME-INR
INR: 1.2 (ref 0.8–1.2)
Prothrombin Time: 14.9 s (ref 11.4–15.2)

## 2023-05-11 MED ORDER — ROCURONIUM BROMIDE 10 MG/ML (PF) SYRINGE
PREFILLED_SYRINGE | INTRAVENOUS | Status: DC | PRN
  Administered 2023-05-11: 200 mg via INTRAVENOUS

## 2023-05-11 MED ORDER — ETOMIDATE 2 MG/ML IV SOLN
INTRAVENOUS | Status: DC | PRN
  Administered 2023-05-11: 20 mg via INTRAVENOUS

## 2023-05-11 MED ORDER — ROCURONIUM BROMIDE 10 MG/ML (PF) SYRINGE
PREFILLED_SYRINGE | INTRAVENOUS | Status: AC
  Filled 2023-05-11: qty 10

## 2023-05-11 MED ORDER — MIDAZOLAM HCL 2 MG/2ML IJ SOLN
INTRAMUSCULAR | Status: AC
  Filled 2023-05-11: qty 2

## 2023-05-11 MED ORDER — PROPOFOL 1000 MG/100ML IV EMUL
5.0000 ug/kg/min | INTRAVENOUS | Status: DC
  Administered 2023-05-11: 20 ug/kg/min via INTRAVENOUS
  Administered 2023-05-12: 30 ug/kg/min via INTRAVENOUS
  Administered 2023-05-12: 10 ug/kg/min via INTRAVENOUS
  Administered 2023-05-12: 50 ug/kg/min via INTRAVENOUS
  Administered 2023-05-12 – 2023-05-13 (×2): 30 ug/kg/min via INTRAVENOUS
  Filled 2023-05-11 (×6): qty 100

## 2023-05-11 MED ORDER — FENTANYL CITRATE PF 50 MCG/ML IJ SOSY
PREFILLED_SYRINGE | INTRAMUSCULAR | Status: AC
  Filled 2023-05-11: qty 2

## 2023-05-11 MED ORDER — KETAMINE HCL 50 MG/5ML IJ SOSY
PREFILLED_SYRINGE | INTRAMUSCULAR | Status: AC
  Filled 2023-05-11: qty 10

## 2023-05-11 MED ORDER — ETOMIDATE 2 MG/ML IV SOLN
INTRAVENOUS | Status: AC
  Filled 2023-05-11: qty 20

## 2023-05-11 MED ORDER — SUCCINYLCHOLINE CHLORIDE 200 MG/10ML IV SOSY
PREFILLED_SYRINGE | INTRAVENOUS | Status: AC
  Filled 2023-05-11: qty 10

## 2023-05-11 MED ORDER — INSULIN ASPART 100 UNIT/ML IJ SOLN: 0.0000 [IU] | Freq: Three times a day (TID) | INTRAMUSCULAR | Status: DC

## 2023-05-11 NOTE — Progress Notes (Signed)
Attempted to call [facility] do give patient report. Informed the nurse has back to back calls, and will return call shortly.

## 2023-05-11 NOTE — Plan of Care (Signed)
  Problem: Metabolic: Goal: Ability to maintain appropriate glucose levels will improve Outcome: Progressing   Problem: Skin Integrity: Goal: Risk for impaired skin integrity will decrease Outcome: Progressing   Problem: Pain Management: Goal: General experience of comfort will improve Outcome: Progressing   Problem: Skin Integrity: Goal: Risk for impaired skin integrity will decrease Outcome: Progressing   Problem: Cardiac: Goal: Ability to achieve and maintain adequate cardiopulmonary perfusion will improve Outcome: Progressing

## 2023-05-11 NOTE — ED Triage Notes (Signed)
Pt BIB [facility] from [facility] c/o respiratory distress. Facility gave 1 neb prior to calling ems. EMS administered 2.4mg  nitroglycerin and placed pt on CPAP, O2 remaining at 84%.

## 2023-05-11 NOTE — Consult Note (Signed)
[facility] Liaison Consult Note   05/11/2023  David [name] Oct 14, 1945 [id number]  Insurance: [place] [employer] Medicare  Primary Care Provider: Renford Dills, MD is with Deboraha Sprang at [facility] provider is listed for the transition of care follow up appointments  and TOC calls  Patient was reviewed for medium risk score 6 days length of stay barriers to care  Patient was screened for hospitalization and on behalf of [facility]  Care Coordination to assess for post hospital community care needs.  Patient is being considered for a skilled nursing facility level of care for post hospital transition.  If the patient goes to a THN/VBCI affiliated facility then, patient can be followed by [facility] RN with traditional Medicare and approved Medicare Advantage plans.   Plan:  Patient currently showing a transition to a NON-THN/VBCI at [facility]. Will sign off after transition  For questions or referrals, please contact:  Charlesetta Shanks, RN, BSN, CCM [place]  [facility], [facility] Liaison Direct Dial: [phone] or secure chat Email: [email or web address]

## 2023-05-11 NOTE — Progress Notes (Signed)
Patient called out wanting to talk to nurse. Patient expressed being concerned that nursing had turned off his oxygen without his permission. Nurse ensured nasal cannula was still in placed and ensured O2 was still running at 5L. Nurse explained to patient that the oxygen was still on and acknowledged his concerns. He expressed that he thinks its off because he can't breath. Satting at 99% on his normal 5L [place], no SOB, however patient was insistent that it be turned up. Nurse placed O2 on 6L per patient request, now satting at 100%. Will continue to monitor.

## 2023-05-11 NOTE — TOC Progression Note (Addendum)
Transition of Care [facility]) - Progression Note    Patient Details  Name: David [name] MRN: [id number] Date of Birth: 1945-11-20  Transition of Care [facility]) CM/SW Contact  Michaela Corner, [place] Phone Number: 05/11/2023, 10:29 AM  Clinical Narrative:   CSW spoke with pts wife, [name], and she decided that they want to go w/ [facility] SNF. CSW explained that [facility] has not answered about referral at this time. [name] states that if [facility] does not respond, family is okay w/ [facility]. CSW followed up and left VM for [employer], Josh, in regard to pt referral.   10:35AM: Per [employer], they accept pts on 3L O2 of less. CSW informed RN.   10:43AM: Ins auth approved 05/11/2023-05/14/2023 Auth ID: [id number]  12:35PM: CSW left VM for [facility] regarding update on SNF preference. Per RN, pt wants to be on 5L O2 and [facility] is unable to manage that. Per Tammy w/ [facility], they are able to accommodate pt being on 5L O2. CSW notified MD.   Expected Discharge Plan: Skilled Nursing Facility Barriers to Discharge: Continued Medical Work up, Other (must enter comment) (waiting on family decision of snf)  Expected Discharge Plan and Services   Discharge Planning Services: CM Consult Post Acute Care Choice: Durable Medical Equipment, Home Health Living arrangements for the past 2 months: Single Family Home                 DME Arranged: Oxygen DME Agency: [employer]       HH Arranged: RN, PT, OT [facility] Agency: [facility] Date [facility] Agency Contacted: 05/06/23 Time HH Agency Contacted: 1300 Representative spoke with at [facility] Agency: Tresa Endo   Social Determinants of Health (SDOH) Interventions SDOH Screenings   Food Insecurity: No Food Insecurity (05/05/2023)  Housing: Low Risk  (05/05/2023)  Transportation Needs: No Transportation Needs (05/05/2023)  Recent Concern: Transportation Needs - Unmet Transportation Needs (05/05/2023)  Utilities: Not At  Risk (05/05/2023)  Financial Resource Strain: Low Risk  (08/20/2017)  Physical Activity: Inactive (08/20/2017)  Social Connections: Socially Integrated (12/19/2017)  Stress: No Stress Concern Present (08/20/2017)  Tobacco Use: Medium Risk (05/05/2023)    Readmission Risk Interventions     No data to display

## 2023-05-11 NOTE — ED Provider Notes (Signed)
David [name] EMERGENCY DEPARTMENT AT [facility] Provider Note  HPI   David [name] is a 77 y.o. male patient with a PMHx of CAD, chronic diastolic CHF, pulmonary hypertension who presents in acute respiratory distress.  EMS provides all the history, and states that The patient is currently out of facility, recent discharge, and was having some shortness of breath earlier, facility reportedly give some albuterol, and he did not improve his respiratory status, when EMS arrived, he was saturating in the 60s, and they put him on CPAP and he only improved to the 70s.  They gave him 2 sublingual nitroglycerin   ROS Negative except as per HPI   Medical Decision Making   Upon presentation, the patient is hypothermic to 35.8, mildly tachycardic, and hypertensive to 140s 150s with tachypnea accessory muscle use and respiratory failure despite CPAP ventilation.  Intubation was required as he was only saturating in the mid to high 60s on full ventilatory support.  Of note differential of flash pulmonary edema was considered, however he was not that hypertensive and not tachycardic, and had been slowly worsening throughout the day and we did not pursue a nitroglycerin drip.   Initially patient was on CPAP saturating in the mid to high 60s, after induction and paralyzing we performed bag valve ventilation which improved saturations to the 90s. . Attempted intubation with after insertion of GlideScope 4, blade, patient had significant aspiration despite suction attempts.  Thoroughly suctioned the airway, difficulty passing the tube although we did have a grade 1 view, just very rigid neck and difficult anatomy.  Patient did not desaturate during this, and we performed bag valve ventilation, second attempt EZ first pass success grade 1 view to best smoother this time required additional suction  Initial lactic acid 1.5, leukocytosis 13.1, hemoglobin 9.2, glucose check was 168 upfront, chest x-ray  confirmed tube placement, has significant whiteout of the right lung as well as partially on the left.  ECG was obtained which shows sinus rhythm with prolonged PR interval.  We are still pending metabolic panel BNP troponins respiratory panel, blood cultures, and we are holding off currently on antibiotics at this point.  This patient required a medical ICU admission for his presentation, and my attending personally will assume care of the patient and finalize disposition.   Per chart review, patient was discharged yesterday from a hospitalization for a heart failure exacerbation, community-acquired pneumonia, hypoxic respiratory failure.  No diagnosis found.  @DISPOSITION @  Rx / DC Orders ED Discharge Orders     None        Past Medical History:  Diagnosis Date   Arthritis    CAD in native artery    s/p stent in 11/18   CHF (congestive heart failure) (HCC)    normal echo in 11/18   CKD (chronic kidney disease)    DDD (degenerative disc disease), cervical    Diabetes mellitus with complication (HCC)    Type II   Diabetic neuropathy (HCC)    Diabetic retinopathy (HCC)    GERD (gastroesophageal reflux disease)    Glaucoma    Hypertension    Past Surgical History:  Procedure Laterality Date   AMPUTATION Right 07/09/2016   Procedure: Right Great Toe Amputation at Metatarsophalangeal Joint;  Surgeon: Nadara Mustard, MD;  Location: [facility] OR;  Service: Orthopedics;  Laterality: Right;   AMPUTATION Right 05/28/2018   Procedure: RIGHT SECOND TOE AMPUTATION;  Surgeon: Nadara Mustard, MD;  Location: [facility] OR;  Service: Orthopedics;  Laterality: Right;   AMPUTATION Left 08/03/2020   Procedure: LEFT 5TH RAY AMPUTATION;  Surgeon: Nadara Mustard, MD;  Location: [facility] OR;  Service: Orthopedics;  Laterality: Left;   BACK SURGERY     4   CARDIAC CATHETERIZATION     CORONARY STENT INTERVENTION N/A 04/06/2017   Procedure: CORONARY STENT INTERVENTION;  Surgeon: Elder Negus, MD;  Location: [facility]  INVASIVE CV LAB;  Service: Cardiovascular;  Laterality: N/A;   CORONARY STENT INTERVENTION N/A 04/07/2017   Procedure: CORONARY STENT INTERVENTION;  Surgeon: Elder Negus, MD;  Location: [facility] INVASIVE CV LAB;  Service: Cardiovascular;  Laterality: N/A;   CYST EXCISION     on Back   ENDOTRACHEAL INTUBATION EMERGENT  08/07/2018       JOINT REPLACEMENT     LAPAROSCOPIC ABDOMINAL EXPLORATION N/A 11/26/2017   Procedure: LAPAROSCOPIC ABDOMINAL EXPLORATION, DRAINAGE OF APPENDICEAL ABCESS. PLACEMENT OF DRAIN;  Surgeon: Ovidio Kin, MD;  Location: [facility] OR;  Service: General;  Laterality: N/A;   RIGHT/LEFT HEART CATH AND CORONARY ANGIOGRAPHY N/A 04/06/2017   Procedure: RIGHT/LEFT HEART CATH AND CORONARY ANGIOGRAPHY;  Surgeon: Elder Negus, MD;  Location: [facility] INVASIVE CV LAB;  Service: Cardiovascular;  Laterality: N/A;   TOE SURGERY  05/2018   Left    TOTAL HIP ARTHROPLASTY     Right    Family History  Problem Relation Age of Onset   Diabetes Other    Hyperlipidemia Other    Hypertension Other    Stroke Other    Alzheimer's disease Other    Thyroid disease Mother    Diabetes Mellitus II Father    Alzheimer's disease Father    Social History   Socioeconomic History   Marital status: Married    Spouse name: Not on file   Number of children: 2   Years of education: 16   Highest education level: Master's degree (e.g., MA, MS, MEng, MEd, MSW, MBA)  Occupational History   Occupation: Retired  Tobacco Use   Smoking status: Former    Current packs/day: 0.00    Average packs/day: 0.3 packs/day for 5.0 years (1.3 ttl pk-yrs)    Types: Cigarettes    Start date: 75    Quit date: 1996    Years since quitting: 28.9    Passive exposure: Past   Smokeless tobacco: Never  Vaping Use   Vaping status: Never Used  Substance and Sexual Activity   Alcohol use: Not Currently    Comment: occ   Drug use: No   Sexual activity: Yes  Other Topics Concern   Not on file  Social History  Narrative   Not on file   Social Drivers of Health   Financial Resource Strain: Low Risk  (08/20/2017)   Overall Financial Resource Strain (CARDIA)    Difficulty of Paying Living Expenses: Not hard at all  Food Insecurity: No Food Insecurity (05/05/2023)   Hunger Vital Sign    Worried About Running Out of Food in the Last Year: Never true    Ran Out of Food in the Last Year: Never true  Transportation Needs: No Transportation Needs (05/05/2023)   PRAPARE - Administrator, Civil Service (Medical): No    Lack of Transportation (Non-Medical): No  Recent Concern: Transportation Needs - Unmet Transportation Needs (05/05/2023)   PRAPARE - Transportation    Lack of Transportation (Medical): Yes    Lack of Transportation (Non-Medical): Yes  Physical Activity: Inactive (08/20/2017)   Exercise Vital Sign    Days  of Exercise per Week: 0 days    Minutes of Exercise per Session: 0 min  Stress: No Stress Concern Present (08/20/2017)   [employer] of Occupational Health - Occupational Stress Questionnaire    Feeling of Stress : Not at all  Social Connections: Socially Integrated (12/19/2017)   Social Connection and Isolation Panel [NHANES]    Frequency of Communication with Friends and Family: Twice a week    Frequency of Social Gatherings with Friends and Family: Twice a week    Attends Religious Services: 1 to 4 times per year    Active Member of [employer] or Organizations: Yes    Attends Banker Meetings: 1 to 4 times per year    Marital Status: Married  Catering manager Violence: Not At Risk (05/05/2023)   Humiliation, Afraid, Rape, and Kick questionnaire    Fear of Current or Ex-Partner: No    Emotionally Abused: No    Physically Abused: No    Sexually Abused: No     Physical Exam   Vitals:   05/11/23 2316  BP: (!) 156/84  Pulse: (!) 110  Resp: (!) 38  Temp: (!) 96.4 F (35.8 C)  TempSrc: Temporal  SpO2: 95%    Physical Exam Vitals and nursing note  reviewed.  HENT:     Head: Normocephalic and atraumatic.     Right Ear: External ear normal.     Left Ear: External ear normal.     Nose: Nose normal.     Mouth/Throat:     Mouth: Mucous membranes are moist.  Eyes:     Extraocular Movements: Extraocular movements intact.     Pupils: Pupils are equal, round, and reactive to light.  Cardiovascular:     Rate and Rhythm: Normal rate.  Pulmonary:     Comments: Patient is tachypneic, accessory muscle use, on CPAP, on nasal cannula, wheezing and crackles heard bilaterally Abdominal:     Tenderness: There is no abdominal tenderness.  Musculoskeletal:     Comments: Patient has extensive venous stasis changes and skin changes over his bilateral lower extremities up to the knees, he has pitting edema  Skin:    General: Skin is warm and dry.  Neurological:     Mental Status: He is alert.      Procedures   If procedures were preformed on this patient, they are listed below:  Procedure Name: Intubation Date/Time: 05/11/2023 11:53 PM  Performed by: Gunnar Bulla, MDPre-anesthesia Checklist: Patient identified, Patient being monitored, Emergency Drugs available, Timeout performed and Suction available Preoxygenation: Pre-oxygenation with 100% oxygen Induction Type: Rapid sequence Laryngoscope Size: Glidescope and 4 Grade View: Grade I Tube size: 7.5 mm Number of attempts: 2 Airway Equipment and Method: Rigid stylet Placement Confirmation: ETT inserted through vocal cords under direct vision, CO2 detector, Breath sounds checked- equal and bilateral and Positive ETCO2 Secured at: 25 cm Tube secured with: ETT holder Comments: Initially patient was on CPAP saturating in the mid to high 60s, after induction and paralyzing we performed bag valve ventilation which improved saturations to the 90s. . Attempted intubation with after insertion of GlideScope 4, blade, patient had significant aspiration despite suction attempts.  Thoroughly suctioned  the airway, difficulty passing the tube although we did have a grade 1 view, just very rigid neck and difficult anatomy.  Patient did not desaturate during this, and we performed bag valve ventilation, second attempt EZ first pass success grade 1 view to best smoother this time required additional suction  The patient was seen, evaluated, and treated in conjunction with the attending physician, who voiced agreement in the care provided.  Note generated using Dragon voice dictation software and may contain dictation errors. Please contact me for any clarification or with any questions.   Electronically signed by:  Osvaldo Shipper, M.D. (PGY-2)    Gunnar Bulla, MD 05/11/23 2359    Gilda Crease, MD 05/12/23 [phone]

## 2023-05-11 NOTE — Plan of Care (Signed)
  Problem: Health Behavior/Discharge Planning: Goal: Ability to manage health-related needs will improve Outcome: Progressing   Problem: Education: Goal: Knowledge of General Education information will improve Description: Including pain rating scale, medication(s)/side effects and non-pharmacologic comfort measures Outcome: Progressing   

## 2023-05-11 NOTE — Care Management Important Message (Signed)
Important Message  Patient Details  Name: David [name] MRN: [id number] Date of Birth: 04-15-1946   Important Message Given:  Yes - Medicare IM     [name] 05/11/2023, 9:56 AM

## 2023-05-11 NOTE — Progress Notes (Signed)
Progress Note    JADARRIAN BRUZEK   SAY:[id number]  DOB: [birth year]  DOA: 05/04/2023     6 PCP: Renford Dills, MD  Initial CC: SOB  Hospital Course: Mr. Rawlinson is a 77 yo male with PMH CAD, chronic diastolic CHF, pulmonary hypertension who presented with SOB.  Patient's wife provided collateral information on admission noting that he had been dyspneic for approximately 1 to 2 weeks.  He has been compliant with home medications except for torsemide due to frequent voiding from it.  He was also having worsening lower extremity edema. Chest x-ray on admission showed moderate right pleural effusion with atelectasis/pneumonia.    Interval History:  No events overnight. Resting in bed. Seems to be breathing better. Legs appear chronic.   Assessment and Plan:  Acute on chronic diastolic CHF, pulmonary hypertension-most recent 2D echo was in February 2024, showing LVEF 50-55%, RV was normal.  He has some evidence of fluid overload with a pleural effusion, lower extremity edema in the setting of him to be nonadherent to home diuretics. -Repeat 2D echo showed LVEF 40-45%, mild concentric LVH, indeterminate diastolic parameters and RV function was mildly reduced.  He has mildly elevated PA pressure, improved however from 70 mmHg at the beginning of this year to 51. -Status post intermittent furosemide, with difficulties given refusal to get blood work on certain days, he is net negative about 3 L and clinically improved..  Weights are inconsistent as they are taken in bed - renal function stable    Possible community-acquired pneumonia-CT scan of the chest done on admission showed right lower lobe consolidation/atelectasis with moderate volume right pleural effusion.  There is no loculation noted.  There may be some additional patchy airspace disease at the left lung base versus edema.  Has been empirically placed on ceftriaxone and metronidazole, continue for now, plan for total of 7 days.  He  does have a productive cough -No culture sent unfortunately.  -patient offered thoracentesis to remove that fluid from the right side to help his breathing even better, however he declined.   Acute hypoxic respiratory failure-on admission, due to #1 and #2.  This is improved, he is stable on room air however he chooses to use oxygen as he feels better with oxygen on despite satting in the 90s on room air.  He agrees to go to SNF and when wanted to go home, he was agreeable to pay out-of-pocket for oxygen just so that he has it for comfort   Chronic kidney disease stage IIIb -baseline creatinine ranging from 1.8-2.5,  - continue trending BMP intermittently    Anemia of chronic renal disease-no bleeding, overall stable.  Attempt CBC tomorrow morning   Essential hypertension-continue amlodipine, blood pressure is stable   Hyperlipidemia-continue statin   Coronary artery disease -with prior PCI in the past, no chest pain, this appears stable   Concern for left foot infection-MRI negative for osteomyelitis or deep infections   DM2, controlled, with hyperglycemia-continue sliding scale   Old records reviewed in assessment of this patient  Antimicrobials: Rocephin 05/05/2023 >> current Vancomycin 05/05/2023 >> 05/07/2023 Flagyl 05/05/2023 >> 05/07/2023  DVT prophylaxis:  enoxaparin (LOVENOX) injection 30 mg Start: 05/06/23 2200 SCDs Start: 05/05/23 0053   Code Status:   Code Status: Full Code  Mobility Assessment (Last 72 Hours)     Mobility Assessment     Row Name 05/10/23 2140 05/10/23 0714 05/09/23 1930 05/09/23 0900 05/08/23 1932   Does patient have an order for bedrest or  is patient medically unstable No - Continue assessment No - Continue assessment No - Continue assessment No - Continue assessment No - Continue assessment   What is the highest level of mobility based on the progressive mobility assessment? Level 2 (Chairfast) - Balance while sitting on edge of bed and cannot  stand Level 1 (Bedfast) - Unable to balance while sitting on edge of bed Level 2 (Chairfast) - Balance while sitting on edge of bed and cannot stand Level 1 (Bedfast) - Unable to balance while sitting on edge of bed Level 1 (Bedfast) - Unable to balance while sitting on edge of bed   Is the above level different from baseline mobility prior to current illness? Yes - Recommend PT order No - Consider discontinuing PT/OT No - Consider discontinuing PT/OT No - Consider discontinuing PT/OT No - Consider discontinuing PT/OT    Row Name 05/08/23 1611 05/08/23 1339 05/08/23 1301       Does patient have an order for bedrest or is patient medically unstable No - Continue assessment -- --     What is the highest level of mobility based on the progressive mobility assessment? Level 2 (Chairfast) - Balance while sitting on edge of bed and cannot stand Level 2 (Chairfast) - Balance while sitting on edge of bed and cannot stand Level 2 (Chairfast) - Balance while sitting on edge of bed and cannot stand     Is the above level different from baseline mobility prior to current illness? Yes - Recommend PT order -- --              Barriers to discharge: none Disposition Plan:  SNF Status is: Inpt  Objective: Blood pressure (!) 155/81, pulse 75, temperature (!) 97.5 F (36.4 C), temperature source Oral, resp. rate 19, height 6\' 1"  (1.854 m), weight 103 kg, SpO2 97%.  Examination:  Physical Exam Constitutional:      General: He is not in acute distress.    Appearance: Normal appearance.  HENT:     Head: Normocephalic and atraumatic.     Mouth/Throat:     Mouth: Mucous membranes are moist.  Eyes:     Extraocular Movements: Extraocular movements intact.  Cardiovascular:     Rate and Rhythm: Normal rate and regular rhythm.  Pulmonary:     Effort: Pulmonary effort is normal. No respiratory distress.     Breath sounds: Normal breath sounds. No wheezing.  Abdominal:     General: Bowel sounds are normal.  There is no distension.     Palpations: Abdomen is soft.     Tenderness: There is no abdominal tenderness.  Musculoskeletal:     Cervical back: Normal range of motion and neck supple.     Right lower leg: Edema (3+) present.     Left lower leg: Edema (3+) present.  Skin:    Comments: Severe scaly skin noted in legs appears almost as seborrheic keratoses  Neurological:     General: No focal deficit present.     Mental Status: He is alert.  Psychiatric:        Mood and Affect: Mood normal.        Behavior: Behavior normal.      Consultants:    Procedures:    Data Reviewed: Results for orders placed or performed during the hospital encounter of 05/04/23 (from the past 24 hours)  Glucose, capillary     Status: Abnormal   Collection Time: 05/10/23  5:34 PM  Result Value Ref Range  Glucose-Capillary 181 (H) 70 - 99 mg/dL  Glucose, capillary     Status: Abnormal   Collection Time: 05/10/23 10:12 PM  Result Value Ref Range   Glucose-Capillary 198 (H) 70 - 99 mg/dL  Basic metabolic panel     Status: Abnormal   Collection Time: 05/11/23  2:23 AM  Result Value Ref Range   Sodium 135 135 - 145 mmol/L   Potassium 4.9 3.5 - 5.1 mmol/L   Chloride 106 98 - 111 mmol/L   CO2 23 22 - 32 mmol/L   Glucose, Bld 182 (H) 70 - 99 mg/dL   BUN 52 (H) 8 - 23 mg/dL   Creatinine, Ser 1.61 (H) 0.61 - 1.24 mg/dL   Calcium 8.5 (L) 8.9 - 10.3 mg/dL   GFR, Estimated 22 (L) >60 mL/min   Anion gap 6 5 - 15  Magnesium     Status: Abnormal   Collection Time: 05/11/23  2:23 AM  Result Value Ref Range   Magnesium 2.6 (H) 1.7 - 2.4 mg/dL  CBC     Status: Abnormal   Collection Time: 05/11/23  2:23 AM  Result Value Ref Range   WBC 6.9 4.0 - 10.5 K/uL   RBC 2.12 (L) 4.22 - 5.81 MIL/uL   Hemoglobin 7.2 (L) 13.0 - 17.0 g/dL   HCT 09.6 (L) 04.5 - 40.9 %   MCV 112.3 (H) 80.0 - 100.0 fL   MCH 34.0 26.0 - 34.0 pg   MCHC 30.3 30.0 - 36.0 g/dL   RDW 81.1 91.4 - 78.2 %   Platelets 202 150 - 400 K/uL   nRBC  0.0 0.0 - 0.2 %  Glucose, capillary     Status: Abnormal   Collection Time: 05/11/23  6:20 AM  Result Value Ref Range   Glucose-Capillary 165 (H) 70 - 99 mg/dL  Glucose, capillary     Status: Abnormal   Collection Time: 05/11/23 11:29 AM  Result Value Ref Range   Glucose-Capillary 183 (H) 70 - 99 mg/dL    I have reviewed pertinent nursing notes, vitals, labs, and images as necessary. I have ordered labwork to follow up on as indicated.  I have reviewed the last notes from staff over past 24 hours. I have discussed patient's care plan and test results with nursing staff, CM/SW, and other staff as appropriate.  Time spent: Greater than 50% of the 55 minute visit was spent in counseling/coordination of care for the patient as laid out in the A&P.   LOS: 6 days   Lewie Chamber, MD [facility] 05/11/2023, 12:27 PM

## 2023-05-11 NOTE — TOC Transition Note (Signed)
Transition of Care [facility]) - Discharge Note   Patient Details  Name: David [name] MRN: [id number] Date of Birth: 1946/04/03  Transition of Care [facility]) CM/SW Contact:  Michaela Corner, LCSWA Phone Number: 05/11/2023, 1:07 PM   Clinical Narrative:   Patient will DC to: [facility]  Anticipated DC date: 05/11/2023 Family notified: [name] (spouse) Transport by: Sharin Mons   Per MD patient ready for DC to [facility] . RN to call report prior to discharge [phone]; room 122). RN, patient, patient's family, and facility notified of DC. Discharge Summary and FL2 sent to facility. DC packet on chart. Ambulance transport requested for patient.   CSW will sign off for now as social work intervention is no longer needed. Please consult [place] again if new needs arise.      Final next level of care: Skilled Nursing Facility Barriers to Discharge: Continued Medical Work up   Patient Goals and CMS Choice Patient states their goals for this hospitalization and ongoing recovery are:: return home with [facility] CMS Medicare.gov Compare Post Acute Care list provided to:: Patient Choice offered to / list presented to : Patient      Discharge Placement                Patient to be transferred to facility by: [facility] Name of family member notified: [name] (spouse) Patient and family notified of of transfer: 05/11/23  Discharge Plan and Services Additional resources added to the After Visit Summary for     Discharge Planning Services: CM Consult Post Acute Care Choice: Durable Medical Equipment, Home Health          DME Arranged: Oxygen DME Agency: [employer]       HH Arranged: RN, PT, OT [facility] Agency: [facility] Date [facility] Agency Contacted: 05/06/23 Time HH Agency Contacted: 1300 Representative spoke with at [facility] Agency: Tresa Endo  Social Drivers of Health (SDOH) Interventions SDOH Screenings   Food Insecurity: No Food Insecurity (05/05/2023)  Housing: Low Risk   (05/05/2023)  Transportation Needs: No Transportation Needs (05/05/2023)  Recent Concern: Transportation Needs - Unmet Transportation Needs (05/05/2023)  Utilities: Not At Risk (05/05/2023)  Financial Resource Strain: Low Risk  (08/20/2017)  Physical Activity: Inactive (08/20/2017)  Social Connections: Socially Integrated (12/19/2017)  Stress: No Stress Concern Present (08/20/2017)  Tobacco Use: Medium Risk (05/05/2023)     Readmission Risk Interventions     No data to display

## 2023-05-11 NOTE — Discharge Summary (Signed)
Physician Discharge Summary   David [name] GNF:[id number] DOB: 1945-08-13 DOA: 05/04/2023  PCP: David Dills, MD  Admit date: 05/04/2023 Discharge date: 05/11/2023  Admitted From: Home Disposition:  SNF Discharging physician: David Chamber, MD Barriers to discharge: none   Discharge Condition: stable CODE STATUS: FULL Diet recommendation:  Diet Orders (From admission, onward)     Start     Ordered   05/11/23 0000  Diet - low sodium heart healthy        05/11/23 1253   05/05/23 0956  Diet 2 gram sodium Room service appropriate? Yes; Fluid consistency: Thin; Fluid restriction: 1500 mL Fluid  Diet effective now       Question Answer Comment  Room service appropriate? Yes   Fluid consistency: Thin   Fluid restriction: 1500 mL Fluid      05/05/23 0955            Hospital Course: [name] is a 77 yo male with PMH CAD, chronic diastolic CHF, pulmonary hypertension who presented with SOB.  Patient's wife provided collateral information on admission noting that he had been dyspneic for approximately 1 to 2 weeks.  He has been compliant with home medications except for torsemide due to frequent voiding from it.  He was also having worsening lower extremity edema. Chest x-ray on admission showed moderate right pleural effusion with atelectasis/pneumonia.    Assessment and Plan:  Acute on chronic diastolic CHF, pulmonary hypertension-most recent 2D echo was in February 2024, showing LVEF 50-55%, RV was normal.  He has some evidence of fluid overload with a pleural effusion, lower extremity edema in the setting of him to be nonadherent to home diuretics. -Repeat 2D echo showed LVEF 40-45%, mild concentric LVH, indeterminate diastolic parameters and RV function was mildly reduced.  He has mildly elevated PA pressure, improved however from 70 mmHg at the beginning of this year to 51. -Status post intermittent furosemide, with difficulties given refusal to get blood work on  certain days, he is net negative about 3 L and clinically improved..  Weights are inconsistent as they are taken in bed - renal function stable    Possible community-acquired pneumonia-CT scan of the chest done on admission showed right lower lobe consolidation/atelectasis with moderate volume right pleural effusion.  There is no loculation noted.  There may be some additional patchy airspace disease at the left lung base versus edema.  Has been empirically placed on ceftriaxone and metronidazole -No culture sent unfortunately.  -patient offered thoracentesis to remove that fluid from the right side to help his breathing even better, however he declined. -Antibiotic course completed during hospitalization   Acute hypoxic respiratory failure-on admission, due to #1 and #2.  This is improved, he is stable on room air however he chooses to use oxygen as he feels better with oxygen on despite satting in the 90s on room air.  He agrees to go to SNF and when wanted to go home, he was agreeable to pay out-of-pocket for oxygen just so that he has it for comfort   Chronic kidney disease stage IIIb -baseline creatinine ranging from 1.8-2.5   Anemia of chronic renal disease-no bleeding, overall stable.  Attempt CBC tomorrow morning   Essential hypertension-continue amlodipine, blood pressure is stable   Hyperlipidemia-continue statin   Coronary artery disease -with prior PCI in the past, no chest pain, this appears stable   Concern for left foot infection-MRI negative for osteomyelitis or deep infections   DM2, controlled, with hyperglycemia-continue sliding scale  Principal Diagnosis: Diastolic CHF [facility])  Discharge Diagnoses: Active Hospital Problems   Diagnosis Date Noted   Diastolic CHF (HCC) 05/04/2023   Acute on chronic diastolic CHF (congestive heart failure) (HCC) 06/29/2022    Priority: 1.   Essential hypertension 08/14/2013    Priority: 2.   Stage 3b chronic kidney disease (HCC)  09/10/2018    Priority: 3.   Type 2 diabetes mellitus with hyperlipidemia (HCC) 03/29/2017    Priority: 4.   PAD (peripheral artery disease) (HCC) 06/13/2022    Priority: 5.   Malnutrition of moderate degree 05/06/2023   Diabetic foot infection (HCC) 05/05/2023   Sacral wound 05/05/2023   Pleural effusion 05/05/2023   Elevated TSH 05/05/2023   Acute on chronic diastolic (congestive) heart failure (HCC) 05/05/2023   Abscess of left foot 08/03/2020   Mixed hyperlipidemia 10/15/2018   Anemia 09/10/2018   Coronary artery disease involving native coronary artery of native heart without angina pectoris 07/30/2018    Resolved Hospital Problems  No resolved problems to display.     Discharge Instructions     Diet - low sodium heart healthy   Complete by: As directed    Increase activity slowly   Complete by: As directed    No wound care   Complete by: As directed       Allergies as of 05/11/2023   No Known Allergies      Medication List     STOP taking these medications    HumaLOG Mix 75/25 KwikPen (75-25) 100 UNIT/ML KwikPen Generic drug: Insulin Lispro Prot & Lispro       TAKE these medications    albuterol 108 (90 Base) MCG/ACT inhaler Commonly known as: VENTOLIN HFA Inhale 2 puffs into the lungs every 6 (six) hours as needed for wheezing or shortness of breath.   amLODipine 10 MG tablet Commonly known as: NORVASC Take 1 tablet (10 mg total) by mouth daily.   aspirin EC 81 MG tablet Take 81 mg by mouth daily.   atorvastatin 40 MG tablet Commonly known as: LIPITOR Take 1 tablet (40 mg total) by mouth daily.   ferrous sulfate 325 (65 FE) MG tablet Take 325 mg by mouth daily with breakfast.   insulin aspart 100 UNIT/ML injection Commonly known as: novoLOG Inject 0-9 Units into the skin 4 (four) times daily -  before meals and at bedtime.   isosorbide-hydrALAZINE 20-37.5 MG tablet Commonly known as: BIDIL Take 2 tablets by mouth 3 (three) times  daily.   latanoprost 0.005 % ophthalmic solution Commonly known as: XALATAN Place 1 drop into both eyes at bedtime.   metoprolol succinate 100 MG 24 hr tablet Commonly known as: TOPROL-XL Take 1 tablet (100 mg total) by mouth daily. Take with or immediately following a meal.   multivitamin with minerals Tabs tablet Take 1 tablet by mouth daily.   nitroGLYCERIN 0.4 MG SL tablet Commonly known as: NITROSTAT Place 1 tablet (0.4 mg total) under the tongue every 5 (five) minutes as needed for chest pain.               Durable Medical Equipment  (From admission, onward)           Start     Ordered   05/07/23 1018  For home use only DME oxygen  Once       Question Answer Comment  Length of Need Lifetime   Mode or (Route) Nasal cannula   Liters per Minute 2   Frequency Continuous (stationary and  portable oxygen unit needed)   Oxygen delivery system Gas      05/07/23 1017            Follow-up Information     [facility].   Specialty: Home Health Services Why: Agency will call you to set up apt times Contact information: [address] [place] [address] [phone]                No Known Allergies  Consultations:   Procedures:   Discharge Exam: BP (!) 155/81 (BP Location: Right Arm)   Pulse 75   Temp (!) 97.5 F (36.4 C) (Oral)   Resp 19   Ht 6\' 1"  (1.854 m)   Wt 103 kg   SpO2 97%   BMI 29.96 kg/m  Physical Exam Constitutional:      General: He is not in acute distress.    Appearance: Normal appearance.  HENT:     Head: Normocephalic and atraumatic.     Mouth/Throat:     Mouth: Mucous membranes are moist.  Eyes:     Extraocular Movements: Extraocular movements intact.  Cardiovascular:     Rate and Rhythm: Normal rate and regular rhythm.  Pulmonary:     Effort: Pulmonary effort is normal. No respiratory distress.     Breath sounds: Normal breath sounds. No wheezing.  Abdominal:     General:  Bowel sounds are normal. There is no distension.     Palpations: Abdomen is soft.     Tenderness: There is no abdominal tenderness.  Musculoskeletal:     Cervical back: Normal range of motion and neck supple.     Right lower leg: Edema (3+) present.     Left lower leg: Edema (3+) present.  Skin:    Comments: Severe scaly skin noted in legs  Neurological:     General: No focal deficit present.     Mental Status: He is alert.  Psychiatric:        Mood and Affect: Mood normal.        Behavior: Behavior normal.      The results of significant diagnostics from this hospitalization (including imaging, microbiology, ancillary and laboratory) are listed below for reference.   Microbiology: No results found for this or any previous visit (from the past 240 hours).   Labs: BNP (last 3 results) Recent Labs    06/29/22 0454 05/04/23 2047  BNP 243.4* 455.1*   Basic Metabolic Panel: Recent Labs  Lab 05/04/23 2045 05/04/23 2305 05/04/23 2317 05/05/23 0732 05/06/23 0251 05/08/23 0240 05/11/23 0223  NA 139  --  139 135 138 136 135  K 4.2  --  4.2 4.1 4.4 4.8 4.9  CL 110  --   --  107 109 107 106  CO2 20*  --   --  23 21* 22 23  GLUCOSE 206*  --   --  179* 103* 168* 182*  BUN 41*  --   --  40* 42* 44* 52*  CREATININE 2.20*  --   --  1.93* 2.45* 2.82* 2.87*  CALCIUM 8.7*  --   --  8.4* 8.3* 8.3* 8.5*  MG  --  2.7*  --  2.5* 2.6* 2.5* 2.6*  PHOS  --  4.0  --  4.5  --   --   --    Liver Function Tests: Recent Labs  Lab 05/04/23 2045 05/05/23 0732 05/06/23 0251 05/08/23 0240  AST 30 23 19  26  ALT 34 29 25 25   ALKPHOS 161* 143* 136* 162*  BILITOT 0.9 1.1 0.6 0.5  PROT 7.5 7.1 6.7 6.9  ALBUMIN 2.9* 2.8* 2.6* 2.7*   No results for input(s): "LIPASE", "AMYLASE" in the last 168 hours. No results for input(s): "AMMONIA" in the last 168 hours. CBC: Recent Labs  Lab 05/04/23 2045 05/04/23 2317 05/05/23 0732 05/06/23 0251 05/08/23 0240 05/11/23 0223  WBC 9.3  --  8.1 8.0  8.1 6.9  NEUTROABS 7.6  --   --   --   --   --   HGB 9.0* 9.5* 8.6* 7.9* 7.6* 7.2*  HCT 29.4* 28.0* 27.4* 25.6* 24.8* 23.8*  MCV 110.1*  --  110.5* 110.3* 111.7* 112.3*  PLT 293  --  279 253 232 202   Cardiac Enzymes: Recent Labs  Lab 05/04/23 2305  CKTOTAL 106   BNP: Invalid input(s): "POCBNP" CBG: Recent Labs  Lab 05/10/23 0613 05/10/23 1734 05/10/23 2212 05/11/23 0620 05/11/23 1129  GLUCAP 147* 181* 198* 165* 183*   D-Dimer No results for input(s): "DDIMER" in the last 72 hours. Hgb A1c No results for input(s): "HGBA1C" in the last 72 hours. Lipid Profile No results for input(s): "CHOL", "HDL", "LDLCALC", "TRIG", "CHOLHDL", "LDLDIRECT" in the last 72 hours. Thyroid function studies No results for input(s): "TSH", "T4TOTAL", "T3FREE", "THYROIDAB" in the last 72 hours.  Invalid input(s): "FREET3" Anemia work up No results for input(s): "VITAMINB12", "FOLATE", "FERRITIN", "TIBC", "IRON", "RETICCTPCT" in the last 72 hours. Urinalysis    Component Value Date/Time   COLORURINE AMBER (A) 05/04/2023 2008   APPEARANCEUR CLOUDY (A) 05/04/2023 2008   LABSPEC 1.014 05/04/2023 2008   PHURINE 5.0 05/04/2023 2008   GLUCOSEU NEGATIVE 05/04/2023 2008   HGBUR NEGATIVE 05/04/2023 2008   BILIRUBINUR NEGATIVE 05/04/2023 2008   KETONESUR NEGATIVE 05/04/2023 2008   PROTEINUR 100 (A) 05/04/2023 2008   UROBILINOGEN 1.0 02/13/2015 1934   NITRITE NEGATIVE 05/04/2023 2008   LEUKOCYTESUR LARGE (A) 05/04/2023 2008   Sepsis Labs Recent Labs  Lab 05/05/23 0732 05/06/23 0251 05/08/23 0240 05/11/23 0223  WBC 8.1 8.0 8.1 6.9   Microbiology No results found for this or any previous visit (from the past 240 hours).  Procedures/Studies: DG CHEST PORT 1 VIEW Result Date: 05/08/2023 CLINICAL DATA:  Shortness of breath.  Pneumonia. EXAM: PORTABLE CHEST 1 VIEW COMPARISON:  CT chest dated May 05, 2023. FINDINGS: Stable cardiomediastinal silhouette. Moderate right pleural effusion  with adjacent right lower lobe collapse/consolidation, not significantly changed. No pneumothorax. No acute osseous abnormality. IMPRESSION: 1. Moderate right pleural effusion with adjacent right lower lobe collapse/consolidation, not significantly changed. Electronically Signed   By: Obie Dredge M.D.   On: 05/08/2023 10:33   DG Foot 2 Views Right Result Date: 05/06/2023 CLINICAL DATA:  Right foot wound.  Cellulitis.  History of diabetes. EXAM: RIGHT FOOT - 2 VIEW COMPARISON:  Right foot radiographs 06/14/2019 FINDINGS: There is a new lucency likely representing an ulcer at the skin surface of the dorsal midfoot at the level of the tarsometatarsal joints, measuring up to approximately 2 cm in longitudinal length of the right and 0.6 cm in craniocaudal depth. No definite cortical erosion is seen. Redemonstration of prior amputation of the phalanges of the first and second digits. Within the limitation of diffuse decreased bone mineralization, no definite new cortical erosion is seen to indicate radiographic evidence of acute osteomyelitis. Mild tarsometatarsal joint space narrowing diffusely. Mild atherosclerotic calcifications. IMPRESSION: 1. New lucency likely representing an ulcer at the skin surface  of the dorsal midfoot at the level of the tarsometatarsal joints. No definite cortical erosion is seen to indicate radiographic evidence of acute osteomyelitis. 2. Redemonstration of prior amputation of the phalanges of the first and second digits. Electronically Signed   By: Neita Garnet M.D.   On: 05/06/2023 12:16   MR FOOT LEFT WO CONTRAST Result Date: 05/06/2023 CLINICAL DATA:  Foot swelling, diabetic, osteomyelitis suspected. Left foot swelling without specific wound. EXAM: MRI OF THE LEFT FOOT WITHOUT CONTRAST TECHNIQUE: Multiplanar, multisequence MR imaging of the left foot was performed. No intravenous contrast was administered. COMPARISON:  Radiographs 05/05/2023 FINDINGS: Technical note: Despite  efforts by the technologist and patient, mild motion artifact is present on today's exam and could not be eliminated. This reduces exam sensitivity and specificity. Bones/Joint/Cartilage Status post amputation through the proximal 5th metatarsal diaphysis. The amputation appears well-healed without cortical destruction or marrow edema. No evidence of osteomyelitis elsewhere in the foot. No evidence of acute fracture or dislocation. The bones are demineralized. There are moderate degenerative changes at the 1st metatarsophalangeal joint. The tibial sesamoid of the 1st metatarsal appears bipartite. No significant joint effusions. The alignment at the Lisfranc joint is normal. Ligaments The Lisfranc ligament is intact. The collateral ligaments of the metatarsophalangeal joints appear intact. Muscles and Tendons Generalized muscular atrophy and edema attributed to diabetes. Mild T2 hyperintensity surrounding the flexor tendons within the forefoot. No evidence of tendon disruption. Soft tissues Decreased T1 and T2 signal within the subcutaneous fat plantar to the 4th metatarsal head may reflect scarring or a pressure lesion. Additional probable pressure lesion plantar to the 1st metatarsal head. No focal skin ulceration or focal fluid collection identified. Nonspecific subcutaneous edema throughout the foot, greatest dorsally. IMPRESSION: 1. No evidence of osteomyelitis or abscess. 2. Status post amputation through the proximal 5th metatarsal diaphysis. 3. Nonspecific subcutaneous edema throughout the foot, greatest dorsally. 4. Generalized muscular atrophy and edema attributed to diabetes. Electronically Signed   By: Carey Bullocks M.D.   On: 05/06/2023 11:20   VAS [place] ABI WITH/WO TBI Result Date: 05/05/2023  LOWER EXTREMITY DOPPLER STUDY Patient Name:  David [name]  Date of Exam:   05/05/2023 Medical Rec #: [id number]         Accession #:    [id number] Date of Birth: [birth year]        Patient Gender: M Patient  Age:   38 years Exam Location:  [facility] Procedure:      VAS [place] ABI WITH/WO TBI Referring Phys: Jonny Ruiz DOUTOVA --------------------------------------------------------------------------------  Indications: Claudication. High Risk Factors: Hypertension, coronary artery disease.  Comparison Study: No prior exam. Performing Technologist: Fernande Bras  Examination Guidelines: A complete evaluation includes at minimum, Doppler waveform signals and systolic blood pressure reading at the level of bilateral brachial, anterior tibial, and posterior tibial arteries, when vessel segments are accessible. Bilateral testing is considered an integral part of a complete examination. Photoelectric Plethysmograph (PPG) waveforms and toe systolic pressure readings are included as required and additional duplex testing as needed. Limited examinations for reoccurring indications may be performed as noted.  ABI Findings: +--------+------------------+-----+----------+--------+ Right   Rt Pressure (mmHg)IndexWaveform  Comment  +--------+------------------+-----+----------+--------+ [id number]                    triphasic          +--------+------------------+-----+----------+--------+ DP      157               1.08 monophasic         +--------+------------------+-----+----------+--------+ +---------+------------------+-----+----------+-------+  Left     Lt Pressure (mmHg)IndexWaveform  Comment +---------+------------------+-----+----------+-------+ Brachial 139                    triphasic         +---------+------------------+-----+----------+-------+ PTA      91                0.63 monophasic        +---------+------------------+-----+----------+-------+ DP       88                0.61 monophasic        +---------+------------------+-----+----------+-------+ Great Toe126               0.87 Abnormal          +---------+------------------+-----+----------+-------+ Right  great to amputation, subsequent digits curled in and unable to elongate to be evaluated.  Summary: Right: Resting right ankle-brachial index is within normal range. Left: Resting left ankle-brachial index indicates moderate left lower extremity arterial disease. The left toe-brachial index is normal. *See table(s) above for measurements and observations.  Electronically signed by Lemar Livings MD on 05/05/2023 at 6:55:31 PM.    Final    ECHOCARDIOGRAM COMPLETE Result Date: 05/05/2023    ECHOCARDIOGRAM REPORT   Patient Name:   David [name] Date of Exam: 05/05/2023 Medical Rec #:  [id number]        Height:       72.0 in Accession #:    [id number]       Weight:       200.0 lb Date of Birth:  [birth year]       BSA:          2.131 m Patient Age:    77 years         BP:           154/69 mmHg Patient Gender: M                HR:           49 bpm. Exam Location:  Inpatient Procedure: 2D Echo, Cardiac Doppler and Color Doppler Indications:    CHF  History:        Patient has prior history of Echocardiogram examinations, most                 recent 06/30/2022. CHF, CAD; Risk Factors:Dyslipidemia, Diabetes                 and Hypertension.  Sonographer:    Melissa Morford RDCS (AE, PE) Referring Phys: [id number] ANASTASSIA DOUTOVA IMPRESSIONS  1. Left ventricular ejection fraction, by estimation, is 40 to 45%. The left ventricle has mildly decreased function. The left ventricle has no regional wall motion abnormalities. There is mild concentric left ventricular hypertrophy. Left ventricular diastolic parameters are indeterminate.  2. Right ventricular systolic function is moderately reduced. The right ventricular size is mildly enlarged. There is moderately elevated pulmonary artery systolic pressure. The estimated right ventricular systolic pressure is 51.0 mmHg.  3. Left atrial size was mild to moderately dilated.  4. Right atrial size was moderately dilated.  5. The mitral valve is normal in structure. No evidence of mitral  valve regurgitation. No evidence of mitral stenosis.  6. Tricuspid valve regurgitation is mild to moderate.  7. The aortic valve is tricuspid. There is mild calcification of the aortic valve. Aortic valve regurgitation is not visualized. No aortic stenosis is present.  8. Aortic dilatation noted. There is borderline  dilatation of the ascending aorta, measuring 38 mm.  9. The inferior vena cava is dilated in size with <50% respiratory variability, suggesting right atrial pressure of 15 mmHg. FINDINGS  Left Ventricle: Left ventricular ejection fraction, by estimation, is 40 to 45%. The left ventricle has mildly decreased function. The left ventricle has no regional wall motion abnormalities. Definity contrast agent was given IV to delineate the left ventricular endocardial borders. The left ventricular internal cavity size was normal in size. There is mild concentric left ventricular hypertrophy. Left ventricular diastolic parameters are indeterminate. Right Ventricle: The right ventricular size is mildly enlarged. No increase in right ventricular wall thickness. Right ventricular systolic function is moderately reduced. There is moderately elevated pulmonary artery systolic pressure. The tricuspid regurgitant velocity is 3.00 m/s, and with an assumed right atrial pressure of 15 mmHg, the estimated right ventricular systolic pressure is 51.0 mmHg. Left Atrium: Left atrial size was mild to moderately dilated. Right Atrium: Right atrial size was moderately dilated. Pericardium: There is no evidence of pericardial effusion. Mitral Valve: The mitral valve is normal in structure. No evidence of mitral valve regurgitation. No evidence of mitral valve stenosis. Tricuspid Valve: The tricuspid valve is normal in structure. Tricuspid valve regurgitation is mild to moderate. No evidence of tricuspid stenosis. Aortic Valve: The aortic valve is tricuspid. There is mild calcification of the aortic valve. Aortic valve regurgitation  is not visualized. No aortic stenosis is present. Pulmonic Valve: The pulmonic valve was normal in structure. Pulmonic valve regurgitation is trivial. No evidence of pulmonic stenosis. Aorta: Aortic dilatation noted. There is borderline dilatation of the ascending aorta, measuring 38 mm. Venous: The inferior vena cava is dilated in size with less than 50% respiratory variability, suggesting right atrial pressure of 15 mmHg. IAS/Shunts: No atrial level shunt detected by color flow Doppler.  LEFT VENTRICLE PLAX 2D LVIDd:         5.20 cm   Diastology LVIDs:         4.80 cm   LV e' medial:    6.84 cm/s LV PW:         1.00 cm   LV E/e' medial:  8.8 LV IVS:        1.10 cm   LV e' lateral:   6.99 cm/s LVOT diam:     2.20 cm   LV E/e' lateral: 8.6 LV SV:         76 LV SV Index:   36 LVOT Area:     3.80 cm  RIGHT VENTRICLE RV S prime:     8.08 cm/s TAPSE (M-mode): 1.7 cm LEFT ATRIUM              Index        RIGHT ATRIUM           Index LA diam:        3.80 cm  1.78 cm/m   RA Area:     19.90 cm LA Vol (A2C):   101.0 ml 47.41 ml/m  RA Volume:   58.20 ml  27.32 ml/m LA Vol (A4C):   96.6 ml  45.34 ml/m LA Biplane Vol: 102.0 ml 47.87 ml/m  AORTIC VALVE LVOT Vmax:   81.10 cm/s LVOT Vmean:  57.400 cm/s LVOT VTI:    0.200 m  AORTA Ao Root diam: 3.20 cm Ao Asc diam:  3.95 cm MITRAL VALVE               TRICUSPID VALVE MV Area (PHT): 3.11 cm  TR Peak grad:   36.0 mmHg MV Decel Time: 244 msec    TR Vmax:        300.00 cm/s MV E velocity: 60.20 cm/s MV A velocity: 27.20 cm/s  SHUNTS MV E/A ratio:  2.21        Systemic VTI:  0.20 m                            Systemic Diam: 2.20 cm Arvilla Meres MD Electronically signed by Arvilla Meres MD Signature Date/Time: 05/05/2023/11:27:07 AM    Final    CT CHEST WO CONTRAST Result Date: 05/05/2023 CLINICAL DATA:  Pneumonia, shortness of breath and right pleural effusion. EXAM: CT CHEST WITHOUT CONTRAST TECHNIQUE: Multidetector CT imaging of the chest was performed following the  standard protocol without IV contrast. RADIATION DOSE REDUCTION: This exam was performed according to the departmental dose-optimization program which includes automated exposure control, adjustment of the mA and/or kV according to patient size and/or use of iterative reconstruction technique. COMPARISON:  Chest x-ray yesterday as well as other prior imaging. FINDINGS: Cardiovascular: Carotid, thoracic aortic and extensive coronary atherosclerosis with prior coronary stent placement. Top-normal/mildly enlarged heart. No pericardial fluid identified. Mildly dilated central pulmonary arteries with the main pulmonary artery measuring 3.4 cm. Normal caliber thoracic aorta. Mediastinum/Nodes: Small scattered mediastinal and supraclavicular lymph nodes without visualized enlarged lymph nodes. Normal tracheal patency. Lungs/Pleura: Dense right lower lobe consolidation/atelectasis with associated moderate volume right pleural effusion. Density of pleural fluid is low and the effusion is not loculated. Minimal/trace pleural fluid on the left. Additional reticulonodular pattern of interstitial thickening in both upper lobes and the lingula may be on the basis of atypical infection, chronic lung disease, heart failure or other interstitial disease. Also suggestion of some potential patchy airspace disease versus atelectasis at the left lung base. Upper Abdomen: Heterogeneous appearance of the liver is nonspecific. Some degree of underlying parenchymal hepatic disease suspected. Correlation suggested with any risk factors for liver disease. Musculoskeletal: No chest wall mass or suspicious bone lesions identified. IMPRESSION: 1. Dense right lower lobe consolidation/atelectasis with associated moderate volume right pleural effusion. Density of pleural fluid is low and the effusion is not loculated. 2. Minimal/trace pleural fluid on the left. 3. Additional reticulonodular pattern of interstitial thickening in both upper lobes and  the lingula may be on the basis of atypical infection, chronic lung disease, heart failure or other interstitial disease. Also suggestion of some potential patchy airspace disease versus atelectasis at the left lung base. Follow-up chest CT may be helpful to follow interstitial disease. 4. Heterogeneous appearance of the liver is nonspecific. Some degree of underlying parenchymal hepatic disease suspected. Correlation suggested with any risk factors for liver disease. 5. Carotid, thoracic aortic and extensive coronary atherosclerosis with prior coronary stent placement. 6. Mildly dilated central pulmonary arteries with the main pulmonary artery measuring 3.4 cm. This is suggestive of some degree of underlying pulmonary arterial hypertension. Aortic Atherosclerosis (ICD10-I70.0). Electronically Signed   By: Irish Lack M.D.   On: 05/05/2023 08:54   DG Foot 2 Views Left Result Date: 05/05/2023 CLINICAL DATA:  Swelling and chronic skin changes EXAM: LEFT FOOT - 2 VIEW COMPARISON:  None Available. FINDINGS: Demineralization. Amputation of the fifth toe at the proximal metatarsal diaphysis. No evidence of acute fracture or osteomyelitis. No dislocation. IMPRESSION: No evidence of acute fracture or osteomyelitis. Electronically Signed   By: Minerva Fester M.D.   On: 05/05/2023 02:12  DG Chest 2 View Result Date: 05/04/2023 CLINICAL DATA:  Shortness of breath EXAM: CHEST - 2 VIEW COMPARISON:  06/29/2022 FINDINGS: Stable cardiomegaly. Aortic atherosclerotic calcification. Moderate layering right pleural effusion and associated airspace opacities. The left lung is clear. No pneumothorax. No displaced rib fractures. IMPRESSION: Moderate right pleural effusion and associated atelectasis or pneumonia. Electronically Signed   By: Minerva Fester M.D.   On: 05/04/2023 19:50     Time coordinating discharge: Over 30 minutes    David Chamber, MD  [facility] 05/11/2023, 12:53 PM

## 2023-05-11 NOTE — Progress Notes (Signed)
Report called to [facility], Cameron LPN. All questions answered

## 2023-05-11 NOTE — Hospital Course (Signed)
David [name] is a 77 yo male with PMH CAD, chronic diastolic CHF, pulmonary hypertension who presented with SOB.  Patient's wife provided collateral information on admission noting that he had been dyspneic for approximately 1 to 2 weeks.  He has been compliant with home medications except for torsemide due to frequent voiding from it.  He was also having worsening lower extremity edema. Chest x-ray on admission showed moderate right pleural effusion with atelectasis/pneumonia.

## 2023-05-12 ENCOUNTER — Inpatient Hospital Stay (HOSPITAL_COMMUNITY): Payer: Medicare Other

## 2023-05-12 DIAGNOSIS — R918 Other nonspecific abnormal finding of lung field: Secondary | ICD-10-CM | POA: Diagnosis not present

## 2023-05-12 DIAGNOSIS — E1140 Type 2 diabetes mellitus with diabetic neuropathy, unspecified: Secondary | ICD-10-CM | POA: Diagnosis not present

## 2023-05-12 DIAGNOSIS — E11319 Type 2 diabetes mellitus with unspecified diabetic retinopathy without macular edema: Secondary | ICD-10-CM | POA: Diagnosis present

## 2023-05-12 DIAGNOSIS — J9601 Acute respiratory failure with hypoxia: Secondary | ICD-10-CM | POA: Diagnosis not present

## 2023-05-12 DIAGNOSIS — I2510 Atherosclerotic heart disease of native coronary artery without angina pectoris: Secondary | ICD-10-CM | POA: Diagnosis present

## 2023-05-12 DIAGNOSIS — Z955 Presence of coronary angioplasty implant and graft: Secondary | ICD-10-CM | POA: Diagnosis not present

## 2023-05-12 DIAGNOSIS — Z87891 Personal history of nicotine dependence: Secondary | ICD-10-CM | POA: Diagnosis not present

## 2023-05-12 DIAGNOSIS — Z48813 Encounter for surgical aftercare following surgery on the respiratory system: Secondary | ICD-10-CM | POA: Diagnosis not present

## 2023-05-12 DIAGNOSIS — I700 Atherosclerosis of aorta: Secondary | ICD-10-CM | POA: Diagnosis not present

## 2023-05-12 DIAGNOSIS — Y95 Nosocomial condition: Secondary | ICD-10-CM | POA: Diagnosis not present

## 2023-05-12 DIAGNOSIS — N179 Acute kidney failure, unspecified: Secondary | ICD-10-CM | POA: Diagnosis not present

## 2023-05-12 DIAGNOSIS — E785 Hyperlipidemia, unspecified: Secondary | ICD-10-CM | POA: Diagnosis present

## 2023-05-12 DIAGNOSIS — J90 Pleural effusion, not elsewhere classified: Secondary | ICD-10-CM

## 2023-05-12 DIAGNOSIS — Z794 Long term (current) use of insulin: Secondary | ICD-10-CM | POA: Diagnosis not present

## 2023-05-12 DIAGNOSIS — J9621 Acute and chronic respiratory failure with hypoxia: Secondary | ICD-10-CM | POA: Diagnosis not present

## 2023-05-12 DIAGNOSIS — Z5982 Transportation insecurity: Secondary | ICD-10-CM | POA: Diagnosis not present

## 2023-05-12 DIAGNOSIS — I509 Heart failure, unspecified: Secondary | ICD-10-CM | POA: Diagnosis not present

## 2023-05-12 DIAGNOSIS — R0602 Shortness of breath: Secondary | ICD-10-CM | POA: Diagnosis not present

## 2023-05-12 DIAGNOSIS — R848 Other abnormal findings in specimens from respiratory organs and thorax: Secondary | ICD-10-CM | POA: Diagnosis not present

## 2023-05-12 DIAGNOSIS — J189 Pneumonia, unspecified organism: Secondary | ICD-10-CM | POA: Diagnosis not present

## 2023-05-12 DIAGNOSIS — Z8249 Family history of ischemic heart disease and other diseases of the circulatory system: Secondary | ICD-10-CM | POA: Diagnosis not present

## 2023-05-12 DIAGNOSIS — N184 Chronic kidney disease, stage 4 (severe): Secondary | ICD-10-CM | POA: Diagnosis not present

## 2023-05-12 DIAGNOSIS — E119 Type 2 diabetes mellitus without complications: Secondary | ICD-10-CM

## 2023-05-12 DIAGNOSIS — M5030 Other cervical disc degeneration, unspecified cervical region: Secondary | ICD-10-CM | POA: Diagnosis present

## 2023-05-12 DIAGNOSIS — Z1152 Encounter for screening for COVID-19: Secondary | ICD-10-CM | POA: Diagnosis not present

## 2023-05-12 DIAGNOSIS — D631 Anemia in chronic kidney disease: Secondary | ICD-10-CM | POA: Diagnosis not present

## 2023-05-12 DIAGNOSIS — E1122 Type 2 diabetes mellitus with diabetic chronic kidney disease: Secondary | ICD-10-CM | POA: Diagnosis not present

## 2023-05-12 DIAGNOSIS — J918 Pleural effusion in other conditions classified elsewhere: Secondary | ICD-10-CM | POA: Diagnosis not present

## 2023-05-12 DIAGNOSIS — R059 Cough, unspecified: Secondary | ICD-10-CM | POA: Diagnosis not present

## 2023-05-12 DIAGNOSIS — Z96641 Presence of right artificial hip joint: Secondary | ICD-10-CM | POA: Diagnosis present

## 2023-05-12 DIAGNOSIS — Z4682 Encounter for fitting and adjustment of non-vascular catheter: Secondary | ICD-10-CM | POA: Diagnosis not present

## 2023-05-12 DIAGNOSIS — I5043 Acute on chronic combined systolic (congestive) and diastolic (congestive) heart failure: Secondary | ICD-10-CM

## 2023-05-12 DIAGNOSIS — I130 Hypertensive heart and chronic kidney disease with heart failure and stage 1 through stage 4 chronic kidney disease, or unspecified chronic kidney disease: Secondary | ICD-10-CM | POA: Diagnosis not present

## 2023-05-12 DIAGNOSIS — I440 Atrioventricular block, first degree: Secondary | ICD-10-CM | POA: Diagnosis present

## 2023-05-12 DIAGNOSIS — I272 Pulmonary hypertension, unspecified: Secondary | ICD-10-CM | POA: Diagnosis not present

## 2023-05-12 DIAGNOSIS — R609 Edema, unspecified: Secondary | ICD-10-CM | POA: Diagnosis not present

## 2023-05-12 DIAGNOSIS — H409 Unspecified glaucoma: Secondary | ICD-10-CM | POA: Diagnosis present

## 2023-05-12 LAB — CBC WITH DIFFERENTIAL/PLATELET
Basophils Relative: 0.1 10*3/uL (ref 0.0–0.1)
Eosinophils Absolute: 1 10*3/uL (ref 0.0–0.5)
Eosinophils Relative: 0.1 10*3/uL (ref 0.0–0.5)
HCT: 30.8 % — ABNORMAL LOW (ref 39.0–52.0)
Hemoglobin: 9.2 g/dL — ABNORMAL LOW (ref 13.0–17.0)
Lymphocytes Relative: 6 %
Lymphs Abs: 0.8 10*3/uL (ref 0.7–4.0)
MCH: 33.8 pg (ref 26.0–34.0)
MCHC: 29.9 g/dL — ABNORMAL LOW (ref 30.0–36.0)
MCV: 113.2 fL — ABNORMAL HIGH (ref 80.0–100.0)
Monocytes Absolute: 1 10*3/uL (ref 0.1–1.0)
Monocytes Relative: 0.9 10*3/uL (ref 0.1–1.0)
Monocytes Relative: 7 10*3/uL (ref 0.7–4.0)
Neutro Abs: 11.1 10*3/uL — ABNORMAL HIGH (ref 1.7–7.7)
Neutrophils Relative %: 85 %
Platelets: 313 10*3/uL (ref 150–400)
RBC: 2.72 MIL/uL — ABNORMAL LOW (ref 4.22–5.81)
RDW: 13.8 % (ref 11.5–15.5)
WBC Morphology: 0 10*3/uL (ref 0.00–0.07)
WBC: 13.1 10*3/uL — ABNORMAL HIGH (ref 4.0–10.5)
nRBC: 0 % (ref 0.0–0.2)
nRBC: 0 10*3/uL

## 2023-05-12 LAB — I-STAT ARTERIAL BLOOD GAS, ED
Acid-base deficit: 4 mmol/L — ABNORMAL HIGH (ref 0.0–2.0)
Bicarbonate: 22.9 mmol/L (ref 20.0–28.0)
Calcium, Ion: 1.25 mmol/L (ref 1.15–1.40)
HCT: 29 % — ABNORMAL LOW (ref 39.0–52.0)
Hemoglobin: 9.9 g/dL — ABNORMAL LOW (ref 13.0–17.0)
O2 Saturation: 96 %
Patient temperature: 98.6
Potassium: 4.8 mmol/L (ref 3.5–5.1)
Sodium: 138 mmol/L (ref 135–145)
TCO2: 24 mmol/L (ref 22–32)
pCO2 arterial: 47.1 mm[Hg] (ref 32–48)
pH, Arterial: 7.295 — ABNORMAL LOW (ref 7.35–7.45)
pO2, Arterial: 93 mm[Hg] (ref 83–108)

## 2023-05-12 LAB — BASIC METABOLIC PANEL
Anion gap: 12 (ref 5–15)
BUN: 54 mg/dL — ABNORMAL HIGH (ref 8–23)
CO2: 19 mmol/L — ABNORMAL LOW (ref 22–32)
Calcium: 8.9 mg/dL (ref 8.9–10.3)
Chloride: 106 mmol/L (ref 98–111)
Creatinine, Ser: 2.88 mg/dL — ABNORMAL HIGH (ref 0.61–1.24)
GFR, Estimated: 22 mL/min — ABNORMAL LOW (ref >60)
Glucose, Bld: 207 mg/dL — ABNORMAL HIGH (ref 70–99)
Potassium: 4.9 mmol/L (ref 3.5–5.1)
Sodium: 137 mmol/L (ref 135–145)

## 2023-05-12 LAB — URINALYSIS, W/ REFLEX TO CULTURE (INFECTION SUSPECTED)
Bacteria, UA: NONE SEEN
Bilirubin Urine: NEGATIVE
Glucose, UA: NEGATIVE mg/dL
Ketones, ur: NEGATIVE mg/dL
Leukocytes,Ua: NEGATIVE
Nitrite: NEGATIVE
Protein, ur: 100 mg/dL — AB
Specific Gravity, Urine: 1.017 (ref 1.005–1.030)
pH: 5 (ref 5.0–8.0)

## 2023-05-12 LAB — COMPREHENSIVE METABOLIC PANEL
ALT: 25 U/L (ref 0–44)
AST: 26 U/L (ref 15–41)
Albumin: 3 g/dL — ABNORMAL LOW (ref 3.5–5.0)
Alkaline Phosphatase: 205 U/L — ABNORMAL HIGH (ref 38–126)
Anion gap: 9 (ref 5–15)
BUN: 54 mg/dL — ABNORMAL HIGH (ref 8–23)
CO2: 19 mmol/L — ABNORMAL LOW (ref 22–32)
Calcium: 8.9 mg/dL (ref 8.9–10.3)
Chloride: 105 mmol/L (ref 98–111)
Creatinine, Ser: 2.85 mg/dL — ABNORMAL HIGH (ref 0.61–1.24)
GFR, Estimated: 22 mL/min — ABNORMAL LOW (ref >60)
Glucose, Bld: 194 mg/dL — ABNORMAL HIGH (ref 70–99)
Potassium: 4.8 mmol/L (ref 3.5–5.1)
Sodium: 133 mmol/L — ABNORMAL LOW (ref 135–145)
Total Bilirubin: 0.5 mg/dL (ref <1.2)
Total Protein: 8.3 g/dL — ABNORMAL HIGH (ref 6.5–8.1)

## 2023-05-12 LAB — POCT I-STAT 7, (LYTES, BLD GAS, ICA,H+H)
Acid-base deficit: 4 mmol/L — ABNORMAL HIGH (ref 0.0–2.0)
Bicarbonate: 22 mmol/L (ref 20.0–28.0)
Calcium, Ion: 1.21 mmol/L (ref 1.15–1.40)
HCT: 29 % — ABNORMAL LOW (ref 39.0–52.0)
Hemoglobin: 9.9 g/dL — ABNORMAL LOW (ref 13.0–17.0)
O2 Saturation: 91 %
Patient temperature: 36.4
Potassium: 4.8 mmol/L (ref 3.5–5.1)
Sodium: 138 mmol/L (ref 135–145)
TCO2: 23 mmol/L (ref 22–32)
pCO2 arterial: 44.7 mm[Hg] (ref 32–48)
pH, Arterial: 7.298 — ABNORMAL LOW (ref 7.35–7.45)
pO2, Arterial: 66 mm[Hg] — ABNORMAL LOW (ref 83–108)

## 2023-05-12 LAB — MAGNESIUM: Magnesium: 2.7 mg/dL — ABNORMAL HIGH (ref 1.7–2.4)

## 2023-05-12 LAB — CBC
HCT: 29 % — ABNORMAL LOW (ref 39.0–52.0)
Hemoglobin: 9 g/dL — ABNORMAL LOW (ref 13.0–17.0)
MCH: 34.1 pg — ABNORMAL HIGH (ref 26.0–34.0)
MCHC: 31 g/dL (ref 30.0–36.0)
MCV: 109.8 fL — ABNORMAL HIGH (ref 80.0–100.0)
Platelets: 276 10*3/uL (ref 150–400)
RBC: 2.64 MIL/uL — ABNORMAL LOW (ref 4.22–5.81)
RDW: 13.8 % (ref 11.5–15.5)
WBC: 13.8 10*3/uL — ABNORMAL HIGH (ref 4.0–10.5)
nRBC: 0 % (ref 0.0–0.2)

## 2023-05-12 LAB — RESP PANEL BY RT-PCR (RSV, FLU A&B, COVID)  RVPGX2
Influenza A by PCR: NEGATIVE
Influenza B by PCR: NEGATIVE
Resp Syncytial Virus by PCR: NEGATIVE
SARS Coronavirus 2 by RT PCR: NEGATIVE

## 2023-05-12 LAB — BODY FLUID CELL COUNT WITH DIFFERENTIAL
Eos, Fluid: 0 %
Lymphs, Fluid: 18 %
Monocyte-Macrophage-Serous Fluid: 10 % — ABNORMAL LOW (ref 50–90)
Neutrophil Count, Fluid: 72 % — ABNORMAL HIGH (ref 0–25)
Total Nucleated Cell Count, Fluid: 146 uL (ref 0–1000)

## 2023-05-12 LAB — GLUCOSE, CAPILLARY
Glucose-Capillary: 191 mg/dL — ABNORMAL HIGH (ref 70–99)
Glucose-Capillary: 193 mg/dL — ABNORMAL HIGH (ref 70–99)
Glucose-Capillary: 194 mg/dL — ABNORMAL HIGH (ref 70–99)
Glucose-Capillary: 189 mg/dL — ABNORMAL HIGH (ref 70–99)
Glucose-Capillary: 178 mg/dL — ABNORMAL HIGH (ref 70–99)
Glucose-Capillary: 134 mg/dL — ABNORMAL HIGH (ref 70–99)

## 2023-05-12 LAB — I-STAT CG4 LACTIC ACID, ED: Lactic Acid, Venous: 1.3 mmol/L (ref 0.5–1.9)

## 2023-05-12 LAB — GLUCOSE, PLEURAL OR PERITONEAL FLUID: Glucose, Fluid: 206 mg/dL

## 2023-05-12 LAB — LACTATE DEHYDROGENASE, PLEURAL OR PERITONEAL FLUID: LD, Fluid: 83 U/L — ABNORMAL HIGH (ref 3–23)

## 2023-05-12 LAB — MRSA NEXT GEN BY PCR, NASAL: MRSA by PCR Next Gen: NOT DETECTED

## 2023-05-12 LAB — LACTATE DEHYDROGENASE: LDH: 204 U/L — ABNORMAL HIGH (ref 98–192)

## 2023-05-12 LAB — PROTEIN, PLEURAL OR PERITONEAL FLUID: Total protein, fluid: 3.1 g/dL

## 2023-05-12 LAB — PROTEIN, TOTAL: Total Protein: 6.8 g/dL (ref 6.5–8.1)

## 2023-05-12 LAB — PHOSPHORUS: Phosphorus: 5.8 mg/dL — ABNORMAL HIGH (ref 2.5–4.6)

## 2023-05-12 LAB — BRAIN NATRIURETIC PEPTIDE: B Natriuretic Peptide: 202.7 pg/mL — ABNORMAL HIGH (ref 0.0–100.0)

## 2023-05-12 LAB — TROPONIN I (HIGH SENSITIVITY)
Troponin I (High Sensitivity): 16 ng/L (ref <18)
Troponin I (High Sensitivity): 40 ng/L — ABNORMAL HIGH (ref <18)

## 2023-05-12 LAB — TRIGLYCERIDES: Triglycerides: 88 mg/dL (ref <150)

## 2023-05-12 LAB — PROCALCITONIN: Procalcitonin: 1.16 ng/mL

## 2023-05-12 MED ORDER — ALBUTEROL SULFATE (2.5 MG/3ML) 0.083% IN NEBU
INHALATION_SOLUTION | RESPIRATORY_TRACT | Status: AC
  Administered 2023-05-12: 2.5 mg
  Filled 2023-05-12: qty 3

## 2023-05-12 MED ORDER — CHLORHEXIDINE GLUCONATE CLOTH 2 % EX PADS
6.0000 | MEDICATED_PAD | Freq: Every day | CUTANEOUS | Status: DC
  Administered 2023-05-12 – 2023-05-25 (×10): 6 via TOPICAL

## 2023-05-12 MED ORDER — DOCUSATE SODIUM 100 MG PO CAPS: 100.0000 mg | ORAL_CAPSULE | Freq: Two times a day (BID) | ORAL | Status: DC

## 2023-05-12 MED ORDER — FAMOTIDINE 20 MG PO TABS
20.0000 mg | ORAL_TABLET | Freq: Every day | ORAL | Status: DC
  Administered 2023-05-12 – 2023-05-14 (×3): 20 mg
  Filled 2023-05-12 (×4): qty 1

## 2023-05-12 MED ORDER — MEDIHONEY WOUND/BURN DRESSING EX PSTE
1.0000 | PASTE | Freq: Every day | CUTANEOUS | Status: DC
Start: 2023-05-12 — End: 2023-05-25
  Administered 2023-05-12 – 2023-05-25 (×13): 1 via TOPICAL
  Filled 2023-05-12: qty 44

## 2023-05-12 MED ORDER — ASPIRIN 81 MG PO CHEW
81.0000 mg | CHEWABLE_TABLET | Freq: Every day | ORAL | Status: DC
  Administered 2023-05-12 – 2023-05-14 (×3): 81 mg
  Filled 2023-05-12 (×4): qty 1

## 2023-05-12 MED ORDER — POLYETHYLENE GLYCOL 3350 17 G PO PACK
17.0000 g | PACK | Freq: Every day | ORAL | Status: DC
  Administered 2023-05-12 – 2023-05-14 (×3): 17 g
  Filled 2023-05-12 (×4): qty 1

## 2023-05-12 MED ORDER — AMLODIPINE BESYLATE 10 MG PO TABS
10.0000 mg | ORAL_TABLET | Freq: Every day | ORAL | Status: DC
  Administered 2023-05-13 – 2023-05-14 (×2): 10 mg
  Filled 2023-05-12 (×2): qty 1

## 2023-05-12 MED ORDER — POLYETHYLENE GLYCOL 3350 17 G PO PACK
17.0000 g | PACK | Freq: Every day | ORAL | Status: DC | PRN
Start: 2023-05-12 — End: 2023-05-12

## 2023-05-12 MED ORDER — ATORVASTATIN CALCIUM 40 MG PO TABS
40.0000 mg | ORAL_TABLET | Freq: Every day | ORAL | Status: DC
  Filled 2023-05-12: qty 1

## 2023-05-12 MED ORDER — SODIUM CHLORIDE 0.9 % IV SOLN
3.0000 g | Freq: Once | INTRAVENOUS | Status: AC
  Administered 2023-05-12: 3 g via INTRAVENOUS
  Filled 2023-05-12: qty 8

## 2023-05-12 MED ORDER — ISOSORB DINITRATE-HYDRALAZINE 20-37.5 MG PO TABS
2.0000 | ORAL_TABLET | Freq: Three times a day (TID) | ORAL | Status: DC
  Administered 2023-05-13 – 2023-05-14 (×3): 2
  Filled 2023-05-12 (×7): qty 2

## 2023-05-12 MED ORDER — ORAL CARE MOUTH RINSE
15.0000 mL | OROMUCOSAL | Status: DC
  Administered 2023-05-12 – 2023-05-13 (×17): 15 mL via OROMUCOSAL

## 2023-05-12 MED ORDER — FENTANYL CITRATE PF 50 MCG/ML IJ SOSY: 25.0000 ug | PREFILLED_SYRINGE | INTRAMUSCULAR | Status: DC | PRN

## 2023-05-12 MED ORDER — METOPROLOL SUCCINATE ER 50 MG PO TB24
100.0000 mg | ORAL_TABLET | Freq: Every day | ORAL | Status: DC
  Filled 2023-05-12: qty 2

## 2023-05-12 MED ORDER — FERROUS SULFATE 325 (65 FE) MG PO TABS
325.0000 mg | ORAL_TABLET | Freq: Every day | ORAL | Status: DC
  Administered 2023-05-12 – 2023-05-14 (×2): 325 mg via ORAL
  Filled 2023-05-12 (×5): qty 1

## 2023-05-12 MED ORDER — HEPARIN SODIUM (PORCINE) 5000 UNIT/ML IJ SOLN
5000.0000 [IU] | Freq: Three times a day (TID) | INTRAMUSCULAR | Status: DC
  Administered 2023-05-12 – 2023-05-25 (×39): 5000 [IU] via SUBCUTANEOUS
  Filled 2023-05-12 (×39): qty 1

## 2023-05-12 MED ORDER — HYDROCERIN EX CREA
TOPICAL_CREAM | Freq: Two times a day (BID) | CUTANEOUS | Status: DC
Start: 2023-05-12 — End: 2023-05-25
  Administered 2023-05-12: 1 via TOPICAL
  Filled 2023-05-12: qty 113

## 2023-05-12 MED ORDER — FUROSEMIDE 10 MG/ML IJ SOLN
80.0000 mg | Freq: Four times a day (QID) | INTRAMUSCULAR | Status: DC
  Administered 2023-05-12: 80 mg via INTRAVENOUS
  Filled 2023-05-12: qty 8

## 2023-05-12 MED ORDER — DOCUSATE SODIUM 50 MG/5ML PO LIQD
100.0000 mg | Freq: Two times a day (BID) | ORAL | Status: DC
  Administered 2023-05-12 – 2023-05-13 (×4): 100 mg
  Filled 2023-05-12 (×5): qty 10

## 2023-05-12 MED ORDER — POLYETHYLENE GLYCOL 3350 17 G PO PACK: 17.0000 g | PACK | Freq: Every day | ORAL | Status: DC | PRN

## 2023-05-12 MED ORDER — ASPIRIN 81 MG PO TBEC
81.0000 mg | DELAYED_RELEASE_TABLET | Freq: Every day | ORAL | Status: DC
  Filled 2023-05-12: qty 1

## 2023-05-12 MED ORDER — LACTATED RINGERS IV BOLUS
1000.0000 mL | Freq: Once | INTRAVENOUS | Status: AC
  Administered 2023-05-12: 1000 mL via INTRAVENOUS

## 2023-05-12 MED ORDER — ORAL CARE MOUTH RINSE: 15.0000 mL | OROMUCOSAL | Status: DC | PRN

## 2023-05-12 MED ORDER — FENTANYL CITRATE PF 50 MCG/ML IJ SOSY
25.0000 ug | PREFILLED_SYRINGE | INTRAMUSCULAR | Status: DC | PRN
  Administered 2023-05-12 (×3): 50 ug via INTRAVENOUS
  Administered 2023-05-12: 100 ug via INTRAVENOUS
  Administered 2023-05-13: 50 ug via INTRAVENOUS
  Filled 2023-05-12: qty 1
  Filled 2023-05-12: qty 2
  Filled 2023-05-12 (×3): qty 1

## 2023-05-12 MED ORDER — ALBUTEROL SULFATE (2.5 MG/3ML) 0.083% IN NEBU
2.5000 mg | INHALATION_SOLUTION | RESPIRATORY_TRACT | Status: AC
  Administered 2023-05-12: 2.5 mg via RESPIRATORY_TRACT
  Filled 2023-05-12: qty 3

## 2023-05-12 MED ORDER — INSULIN ASPART 100 UNIT/ML IJ SOLN
0.0000 [IU] | INTRAMUSCULAR | Status: DC
  Administered 2023-05-12 (×3): 3 [IU] via SUBCUTANEOUS
  Administered 2023-05-13 (×4): 2 [IU] via SUBCUTANEOUS
  Administered 2023-05-14: 3 [IU] via SUBCUTANEOUS

## 2023-05-12 MED ORDER — SODIUM CHLORIDE 0.9 % IV SOLN
2.0000 g | INTRAVENOUS | Status: AC
  Administered 2023-05-12 – 2023-05-16 (×5): 2 g via INTRAVENOUS
  Filled 2023-05-12 (×5): qty 12.5

## 2023-05-12 MED ORDER — DOCUSATE SODIUM 100 MG PO CAPS: 100.0000 mg | ORAL_CAPSULE | Freq: Two times a day (BID) | ORAL | Status: DC | PRN

## 2023-05-12 MED ORDER — ATORVASTATIN CALCIUM 40 MG PO TABS
40.0000 mg | ORAL_TABLET | Freq: Every day | ORAL | Status: DC
  Administered 2023-05-12 – 2023-05-14 (×3): 40 mg
  Filled 2023-05-12 (×3): qty 1

## 2023-05-12 MED ORDER — HYDRALAZINE HCL 20 MG/ML IJ SOLN: 10.0000 mg | INTRAMUSCULAR | Status: DC | PRN

## 2023-05-12 MED ORDER — METOPROLOL TARTRATE 25 MG PO TABS
50.0000 mg | ORAL_TABLET | Freq: Two times a day (BID) | ORAL | Status: DC
  Administered 2023-05-12 – 2023-05-14 (×3): 50 mg
  Filled 2023-05-12 (×5): qty 2

## 2023-05-12 MED ORDER — FUROSEMIDE 10 MG/ML IJ SOLN
80.0000 mg | Freq: Once | INTRAMUSCULAR | Status: AC
  Administered 2023-05-12: 80 mg via INTRAVENOUS
  Filled 2023-05-12: qty 8

## 2023-05-12 MED ORDER — GERHARDT'S BUTT CREAM
TOPICAL_CREAM | Freq: Two times a day (BID) | CUTANEOUS | Status: DC
  Administered 2023-05-12 – 2023-05-17 (×2): 1 via TOPICAL
  Filled 2023-05-12: qty 60

## 2023-05-12 MED ORDER — AMLODIPINE BESYLATE 10 MG PO TABS
10.0000 mg | ORAL_TABLET | Freq: Every day | ORAL | Status: DC
  Filled 2023-05-12: qty 1

## 2023-05-12 MED ORDER — INSULIN ASPART 100 UNIT/ML IJ SOLN: 0.0000 [IU] | INTRAMUSCULAR | Status: DC

## 2023-05-12 MED ORDER — ISOSORB DINITRATE-HYDRALAZINE 20-37.5 MG PO TABS
2.0000 | ORAL_TABLET | Freq: Three times a day (TID) | ORAL | Status: DC
Start: 2023-05-12 — End: 2023-05-12
  Filled 2023-05-12: qty 2

## 2023-05-12 MED ORDER — INSULIN ASPART 100 UNIT/ML IJ SOLN
0.0000 [IU] | Freq: Three times a day (TID) | INTRAMUSCULAR | Status: DC
  Administered 2023-05-12: 2 [IU] via SUBCUTANEOUS

## 2023-05-12 NOTE — Procedures (Signed)
Thoracentesis  Procedure Note  David [name]  [id number]  04/07/1946  Date:05/12/23  Time:10:45 AM   Provider Performing:Marilynne Dupuis E  Cherlynn Polo   Procedure: Thoracentesis with imaging guidance (19147)  Indication(s) Pleural Effusion  Consent Risks of the procedure as well as the alternatives and risks of each were explained to the patient and/or caregiver.  Consent for the procedure was obtained and is signed in the bedside chart  Anesthesia Topical only with 1% lidocaine    Time Out Verified patient identification, verified procedure, site/side was marked, verified correct patient position, special equipment/implants available, medications/allergies/relevant history reviewed, required imaging and test results available.   Sterile Technique Maximal sterile technique including full sterile barrier drape, hand hygiene, sterile gown, sterile gloves, mask, hair covering, sterile ultrasound probe cover (if used).  Procedure Description Ultrasound was used to identify appropriate pleural anatomy for placement and overlying skin marked.  Area of drainage cleaned and draped in sterile fashion. Lidocaine was used to anesthetize the skin and subcutaneous tissue.  1900 cc's of yellow, clear appearing fluid was drained from the right pleural space. Catheter then removed and bandaid applied to site.   Complications/Tolerance None; patient tolerated the procedure well. Chest X-ray is ordered to confirm no post-procedural complication.   EBL Minimal   Specimen(s) Pleural fluid  Under direct supervision of Durel Salts, MD  Cristopher Peru, PA-C [facility] 05/12/23 10:46 AM  Please see [email or web address] for pager details.  From 7A-7P if no response, please call [phone] After hours, please call ELink [phone]

## 2023-05-12 NOTE — Progress Notes (Signed)
Started patients tidal volume out at  78ml/kg due to increased plateau pressures.

## 2023-05-12 NOTE — Progress Notes (Addendum)
NAME:  David [name], MRN:  [id number], DOB:  February 19, 1946, LOS: 0 ADMISSION DATE:  05/11/2023, CONSULTATION DATE:  05/11/2023 REFERRING MD:  Gunnar Bulla, MD, CHIEF COMPLAINT:  resp distress last night   History of Present Illness:  A 77 y.o. male patient with a PMHx of CAD (PCI), HTN, dyslipidemia, chronic systolic/diastolic CHF (May 05, 2023: Echo EF 40-45%), pulmonary hypertension (51 mmHg), anemia of chronic illness, DM-2, neuropathy, CKD-4, and possible OSA who was discharged yesterday for CHF exacerbation, acute hypoxic resp failure, and PNA, who presents from NH after being there for 4 hrs with acute respiratory distress.  EMS gave some albuterol, and he did not improve his respiratory status, when EMS arrived, he was saturating in the 60s, and they put him on CPAP and he only improved to the 70s. He failed NRM. They gave him 2 sublingual nitroglycerin for HTN. He was hypothermic to 35.8 F, mildly tachycardic, and hypertensive to 140s 150s with tachypnea accessory muscle use and respiratory failure despite CPAP ventilation. He became lethargic, so he was intubated. Wife at the bedside denied choking but she noticed that he has apneas and last sleep eval was 2019. He quit smoking many yrs ago and he smoked few cigarettes daily.    Pertinent  Medical History  CAD (PCI), HTN, dyslipidemia, chronic systolic/diastolic CHF (May 05, 2023: Echo EF 40-45%), pulmonary hypertension (51 mmHg), anemia of chronic illness, DM-2, CKD-4,  possible OSA, peripheral neuropathy, diabatic foot amputations   Significant Hospital Events: Including procedures, antibiotic start and stop dates in addition to other pertinent events   Given Lasix 80 mg IV in ED Intubated on Propofol   Interim History / Subjective:  No overnight issues. Minimal response to lasix Reported aspiration prior to admission   Objective   Blood pressure (!) 120/58, pulse 76, temperature 98.2 F (36.8 C), resp. rate 20, height 5\' 11"   (1.803 m), weight 103.5 kg, SpO2 100%.    Vent Mode: PRVC FiO2 (%):  [90 %-100 %] 90 % Set Rate:  [20 bmp-24 bmp] 20 bmp Vt Set:  [470 mL-520 mL] 520 mL PEEP:  [8 cmH20-10 cmH20] 10 cmH20 Plateau Pressure:  [28 cmH20-39 cmH20] 38 cmH20   Intake/Output Summary (Last 24 hours) at 05/12/2023 1059 Last data filed at 05/12/2023 0800 Gross per 24 hour  Intake 294.61 ml  Output 950 ml  Net -655.39 ml   Filed Weights   05/12/23 0400  Weight: 103.5 kg    Examination: Gen:      Intubated, sedated, acutely ill appearing HEENT:  ETT to vent Lungs:    sounds of mechanical ventilation auscultated, diminishd right lung base no wheeze CV:         RRR Abd:      + bowel sounds; soft, non-tender; no palpable masses, no distension Ext:    No edema Skin:      bilateral venous stasis, wounds poa Neuro:   sedated, RASS -2, moves all 4 extremities     CBGs>180 ABG 7.298/44.7/PO2 66  Na 137 K 4.9 Cr 2.88 BUN 54 Phos 5.8 WBC 13.8 Hgb 9.0  BNP 202 Mrsa swab negative    Resolved Hospital Problem list     Assessment & Plan:  Acute hypoxemic respiratory failure Right pleural effusion HAP - broaden abx to HAP coverage with cefepime - minimal response to lasix, BNP low, will stop lasix, may need additional gentle IVF - large right sided free flowing effusion. Thoracentesis today, will send for cultures, studies to r/o parapneumonic  effusin  CAD (PCI), HTN, dyslipidemia, chronic systolic/diastolic CHF (May 05, 2023: Echo EF 40-45%), pulmonary hypertension (51 mmHg) Chronic HFrEF - Resume home meds - stop lasix - gentle IVF x 1 L  Anemia of chronic illness Monitor CBC Recent anemia w/u was noted  DM-2 (well controlled, HbA1c 6.4%) Glycemic control Increase SSI and switch to q4hour  Peripheral neuropathy Pain Mx after extubation  CKD-4 Monitor BMP I/O chart IV Lasix Renal U/S  Possible OSA Needs NPSG as outpatient   Best Practice (right click and "Reselect all  SmartList Selections" daily)   Diet/type: NPO w/ oral meds DVT prophylaxis prophylactic heparin  Pressure ulcer(s): N/A GI prophylaxis: H2B Lines: N/A Foley:  Yes, and it is still needed Code Status:  full code Last date of multidisciplinary goals of care discussion [wife - will call to update today]   Critical care time:     The patient is critically ill due to respiratory failure.  Critical care was necessary to treat or prevent imminent or life-threatening deterioration.  Critical care was time spent personally by me on the following activities: development of treatment plan with patient and/or surrogate as well as nursing, discussions with consultants, evaluation of patient's response to treatment, examination of patient, obtaining history from patient or surrogate, ordering and performing treatments and interventions, ordering and review of laboratory studies, ordering and review of radiographic studies, pulse oximetry, re-evaluation of patient's condition and participation in multidisciplinary rounds.   Critical Care Time devoted to patient care services described in this note is 39 minutes. This time reflects time of care of this signee Charlott Holler . This critical care time does not reflect separately billable procedures or procedure time, teaching time or supervisory time of PA/NP/Med student/Med Resident etc but could involve care discussion time.       Charlott Holler [place] [facility] 05/12/2023 10:59 AM  Pager: see AMION  If no response to pager , please call critical care on call (see AMION) until 7pm After 7:00 pm call Elink

## 2023-05-12 NOTE — TOC Initial Note (Signed)
Transition of Care [facility]) - Initial/Assessment Note    Patient Details  Name: David [name] MRN: [id number] Date of Birth: [birth year]  Transition of [facility] CM/SW Contact:    Marliss Coots, LCSW Phone Number: 05/12/2023, 11:27 AM  Clinical Narrative:                  11:27 AM Per chart review, patient is from [facility] and currently on a ventilator. TOC will continue to follow.  Admission diagnosis:  CHF (congestive heart failure) (HCC) [I50.9] Patient Active Problem List   Diagnosis Date Noted   CHF (congestive heart failure) (HCC) 05/12/2023   Malnutrition of moderate degree 05/06/2023   Diabetic foot infection (HCC) 05/05/2023   Sacral wound 05/05/2023   Pleural effusion on right 05/05/2023   Elevated TSH 05/05/2023   Acute on chronic diastolic (congestive) heart failure (HCC) 05/05/2023   Diastolic CHF (HCC) 05/04/2023   Class 1 obesity 06/30/2022   Acute on chronic diastolic CHF (congestive heart failure) (HCC) 06/29/2022   PAD (peripheral artery disease) (HCC) 06/13/2022   Critical limb ischemia of left lower extremity (HCC) 03/21/2021   Nonhealing ulcer of left lower extremity limited to breakdown of skin (HCC) 03/21/2021   Abscess of left foot 08/03/2020   Osteomyelitis of fifth toe of left foot (HCC)    Mixed hyperlipidemia 10/15/2018   Bilateral leg edema 09/17/2018   Stage 3b chronic kidney disease (HCC) 09/10/2018   Anemia 09/10/2018   Pain and swelling of wrist, right 08/14/2018   Bilateral carotid artery stenosis 07/30/2018   Coronary artery disease involving native coronary artery of native heart without angina pectoris 07/30/2018   Snoring 02/11/2018   Chronic heart failure with preserved ejection fraction (HFpEF) (HCC) 12/22/2017   At risk for adverse drug reaction 12/15/2017   Chronic pain syndrome 12/15/2017   Status post coronary artery stent placement    Multifocal pneumonia 03/30/2017   Type 2 diabetes mellitus with hyperlipidemia  (HCC) 03/29/2017   Acute respiratory failure with hypoxemia (HCC) 03/29/2017   Acute on chronic respiratory failure with hypoxia (HCC)    Pulmonary congestion    Acquired contracture of Achilles tendon, right 08/19/2016   Amputated great toe, right (HCC) 07/18/2016   Onychomycosis 05/29/2016   Diabetic polyneuropathy associated with type 2 diabetes mellitus (HCC) 04/09/2016   Right foot ulcer, limited to breakdown of skin (HCC) 04/09/2016   Hereditary and idiopathic peripheral neuropathy 09/15/2013   Malaise 08/14/2013   Essential hypertension 08/14/2013   Chronic back pain 08/14/2013   Glaucoma 08/14/2013   Headache 08/14/2013   PCP:  Renford Dills, MD Pharmacy:   [facility] - [place], [place] - [address] [place] [address] Phone: [phone] Fax: [phone]  [facility] [address] [place] - [address] [place] [address] Phone: [phone] Fax: [phone]     Social Drivers of Health (SDOH) Social History: SDOH Screenings   Food Insecurity: Patient Unable To Answer (05/12/2023)  Housing: Patient Unable To Answer (05/12/2023)  Transportation Needs: Patient Unable To Answer (05/12/2023)  Recent Concern: Transportation Needs - Unmet Transportation Needs (05/05/2023)  Utilities: Patient Unable To Answer (05/12/2023)  Financial Resource Strain: Low Risk  (08/20/2017)  Physical Activity: Inactive (08/20/2017)  Social Connections: Socially Integrated (12/19/2017)  Stress: No Stress Concern Present (08/20/2017)  Tobacco Use: Medium Risk (05/05/2023)   SDOH Interventions:     Readmission Risk Interventions     No  data to display

## 2023-05-12 NOTE — Progress Notes (Signed)
Patient arrived from ED with the following belongings: cell phone, tablet, charger, multiple pieces of mail, work gloves and hat. All belongings secured in patient belonging bag placed in patient room.   Barbaraann Cao RN

## 2023-05-12 NOTE — Progress Notes (Signed)
ED Pharmacy Antibiotic Sign Off An antibiotic consult was received from an ED provider for Unasyn per pharmacy dosing for Asp PNA. A chart review was completed to assess appropriateness.   The following one time order(s) were placed:   -Unasyn 3g IV x1  Further antibiotic and/or antibiotic pharmacy consults should be ordered by the admitting provider if indicated.   Thank you for allowing pharmacy to be a part of this patient's care.   Arabella Merles, [facility]  Clinical Pharmacist 05/12/23 1:11 AM

## 2023-05-12 NOTE — H&P (Signed)
NAME:  David [name], MRN:  [id number], DOB:  1946/02/06, LOS: 0 ADMISSION DATE:  05/11/2023, CONSULTATION DATE:  05/11/2023 REFERRING MD:  Gunnar Bulla, MD, CHIEF COMPLAINT:  resp distress last night   History of Present Illness:  A 77 y.o. male patient with a PMHx of CAD (PCI), HTN, dyslipidemia, chronic systolic/diastolic CHF (May 05, 2023: Echo EF 40-45%), pulmonary hypertension (51 mmHg), anemia of chronic illness, DM-2, neuropathy, CKD-4, and possible OSA who was discharged yesterday for CHF exacerbation, acute hypoxic resp failure, and PNA, who presents from NH after being there for 4 hrs with acute respiratory distress.  EMS gave some albuterol, and he did not improve his respiratory status, when EMS arrived, he was saturating in the 60s, and they put him on CPAP and he only improved to the 70s. He failed NRM. They gave him 2 sublingual nitroglycerin for HTN. He was hypothermic to 35.8 F, mildly tachycardic, and hypertensive to 140s 150s with tachypnea accessory muscle use and respiratory failure despite CPAP ventilation. He became lethargic, so he was intubated. Wife at the bedside denied choking but she noticed that he has apneas and last sleep eval was 2019. He quit smoking many yrs ago and he smoked few cigarettes daily.    Pertinent  Medical History  CAD (PCI), HTN, dyslipidemia, chronic systolic/diastolic CHF (May 05, 2023: Echo EF 40-45%), pulmonary hypertension (51 mmHg), anemia of chronic illness, DM-2, CKD-4,  possible OSA, peripheral neuropathy, diabatic foot amputations   Significant Hospital Events: Including procedures, antibiotic start and stop dates in addition to other pertinent events   Given Lasix 80 mg IV in ED Intubated on Propofol   Interim History / Subjective:    Objective   Blood pressure (!) 146/70, pulse 85, temperature (!) 96.7 F (35.9 C), resp. rate 20, height 5\' 11"  (1.803 m), SpO2 100%.    Vent Mode: PRVC FiO2 (%):  [100 %] 100 % Set Rate:  [24  bmp] 24 bmp Vt Set:  [470 mL] 470 mL PEEP:  [8 cmH20] 8 cmH20 Plateau Pressure:  [38 cmH20] 38 cmH20  No intake or output data in the 24 hours ending 05/12/23 0223 There were no vitals filed for this visit.  Examination: General: intubated with ETT 7.5 @ 23 cm at teeth. PRVC 20/500/+10/100% Sedated, comfortable. SpO2 100%  HENT: PERL, normal pharynx and oral mucosa. No LNE or thyromegaly. +ve JVD Lungs: symmetrical air entry bilaterally. Diffuse crackles. No wheezing Cardiovascular: NL S1/S2. No m/g/r Abdomen: no distension or tenderness Extremities: +2 edema. Symmetrical  Neuro: sedated   Resolved Hospital Problem list     Assessment & Plan:  Acute hypoxic resp failure due to CHF exacerbation  I/O chart Daily wt IV Lasix Fluid restriction  Serial BMP, Mg, PO4 BP control ABG PCXR VAP bundle and PADIS protocol PCT  CAD (PCI), HTN, dyslipidemia, chronic systolic/diastolic CHF (May 05, 2023: Echo EF 40-45%), pulmonary hypertension (51 mmHg) Resume home meds  Anemia of chronic illness Monitor CBC Recent anemia w/u was noted  DM-2 (well controlled, HbA1c 6.4%) Glycemic control ISS  Peripheral neuropathy Pain Mx after extubation  CKD-4 Monitor BMP I/O chart IV Lasix Renal U/S  Possible OSA Needs NPSG as outpatient   Best Practice (right click and "Reselect all SmartList Selections" daily)   Diet/type: NPO w/ oral meds DVT prophylaxis prophylactic heparin  Pressure ulcer(s): N/A GI prophylaxis: H2B Lines: N/A Foley:  Yes, and it is still needed Code Status:  full code Last date of multidisciplinary goals of care  discussion [wife]  Labs   CBC: Recent Labs  Lab 05/05/23 0732 05/06/23 0251 05/08/23 0240 05/11/23 0223 05/11/23 2310 05/12/23 0043  WBC 8.1 8.0 8.1 6.9 13.1*  --   NEUTROABS  --   --   --   --  11.1*  --   HGB 8.6* 7.9* 7.6* 7.2* 9.2* 9.9*  HCT 27.4* 25.6* 24.8* 23.8* 30.8* 29.0*  MCV 110.5* 110.3* 111.7* 112.3* 113.2*  --   PLT 279  253 232 202 313  --     Basic Metabolic Panel: Recent Labs  Lab 05/05/23 0732 05/06/23 0251 05/08/23 0240 05/11/23 0223 05/11/23 2310 05/12/23 0043  NA 135 138 136 135 133* 138  K 4.1 4.4 4.8 4.9 4.8 4.8  CL 107 109 107 106 105  --   CO2 23 21* 22 23 19*  --   GLUCOSE 179* 103* 168* 182* 194*  --   BUN 40* 42* 44* 52* 54*  --   CREATININE 1.93* 2.45* 2.82* 2.87* 2.85*  --   CALCIUM 8.4* 8.3* 8.3* 8.5* 8.9  --   MG 2.5* 2.6* 2.5* 2.6*  --   --   PHOS 4.5  --   --   --   --   --    GFR: Estimated Creatinine Clearance: 26.9 mL/min (A) (by C-G formula based on SCr of 2.85 mg/dL (H)). Recent Labs  Lab 05/06/23 0251 05/08/23 0240 05/11/23 0223 05/11/23 2310 05/11/23 2333 05/12/23 0158  WBC 8.0 8.1 6.9 13.1*  --   --   LATICACIDVEN  --   --   --   --  1.5 1.3    Liver Function Tests: Recent Labs  Lab 05/05/23 0732 05/06/23 0251 05/08/23 0240 05/11/23 2310  AST 23 19 26 26   ALT 29 25 25 25   ALKPHOS 143* 136* 162* 205*  BILITOT 1.1 0.6 0.5 0.5  PROT 7.1 6.7 6.9 8.3*  ALBUMIN 2.8* 2.6* 2.7* 3.0*   No results for input(s): "LIPASE", "AMYLASE" in the last 168 hours. No results for input(s): "AMMONIA" in the last 168 hours.  ABG    Component Value Date/Time   PHART 7.295 (L) 05/12/2023 0043   PCO2ART 47.1 05/12/2023 0043   PO2ART 93 05/12/2023 0043   HCO3 22.9 05/12/2023 0043   TCO2 24 05/12/2023 0043   ACIDBASEDEF 4.0 (H) 05/12/2023 0043   O2SAT 96 05/12/2023 0043     Coagulation Profile: Recent Labs  Lab 05/11/23 2310  INR 1.2    Cardiac Enzymes: No results for input(s): "CKTOTAL", "CKMB", "CKMBINDEX", "TROPONINI" in the last 168 hours.  HbA1C: Hgb A1c MFr Bld  Date/Time Value Ref Range Status  05/04/2023 11:05 PM 6.4 (H) 4.8 - 5.6 % Final    Comment:    (NOTE) Pre diabetes:          5.7%-6.4%  Diabetes:              >6.4%  Glycemic control for   <7.0% adults with diabetes   06/29/2022 11:30 AM 8.6 (H) 4.8 - 5.6 % Final    Comment:     (NOTE) Pre diabetes:          5.7%-6.4%  Diabetes:              >6.4%  Glycemic control for   <7.0% adults with diabetes     CBG: Recent Labs  Lab 05/10/23 2212 05/11/23 0620 05/11/23 1129 05/11/23 1530 05/11/23 2304  GLUCAP 198* 165* 183* 182* 168*    Review of  Systems:   Intubated   Past Medical History:  He,  has a past medical history of Arthritis, CAD in native artery, CHF (congestive heart failure) (HCC), CKD (chronic kidney disease), DDD (degenerative disc disease), cervical, Diabetes mellitus with complication (HCC), Diabetic neuropathy (HCC), Diabetic retinopathy (HCC), GERD (gastroesophageal reflux disease), Glaucoma, and Hypertension.   Surgical History:   Past Surgical History:  Procedure Laterality Date   AMPUTATION Right 07/09/2016   Procedure: Right Great Toe Amputation at Metatarsophalangeal Joint;  Surgeon: Nadara Mustard, MD;  Location: [facility] OR;  Service: Orthopedics;  Laterality: Right;   AMPUTATION Right 05/28/2018   Procedure: RIGHT SECOND TOE AMPUTATION;  Surgeon: Nadara Mustard, MD;  [facility];  Service: Orthopedics;  Laterality: Right;   AMPUTATION Left 08/03/2020   Procedure: LEFT 5TH RAY AMPUTATION;  Surgeon: Nadara Mustard, MD;  Location: [facility] OR;  Service: Orthopedics;  Laterality: Left;   BACK SURGERY     4   CARDIAC CATHETERIZATION     CORONARY STENT INTERVENTION N/A 04/06/2017   Procedure: CORONARY STENT INTERVENTION;  Surgeon: Elder Negus, MD;  Location: [facility] INVASIVE CV LAB;  Service: Cardiovascular;  Laterality: N/A;   CORONARY STENT INTERVENTION N/A 04/07/2017   Procedure: CORONARY STENT INTERVENTION;  Surgeon: Elder Negus, MD;  Location: [facility] INVASIVE CV LAB;  Service: Cardiovascular;  Laterality: N/A;   CYST EXCISION     on Back   ENDOTRACHEAL INTUBATION EMERGENT  08/07/2018       JOINT REPLACEMENT     LAPAROSCOPIC ABDOMINAL EXPLORATION N/A 11/26/2017   Procedure: LAPAROSCOPIC ABDOMINAL EXPLORATION, DRAINAGE OF  APPENDICEAL ABCESS. PLACEMENT OF DRAIN;  Surgeon: Ovidio Kin, MD;  Location: [facility] OR;  Service: General;  Laterality: N/A;   RIGHT/LEFT HEART CATH AND CORONARY ANGIOGRAPHY N/A 04/06/2017   Procedure: RIGHT/LEFT HEART CATH AND CORONARY ANGIOGRAPHY;  Surgeon: Elder Negus, MD;  Location: [facility] INVASIVE CV LAB;  Service: Cardiovascular;  Laterality: N/A;   TOE SURGERY  05/2018   Left    TOTAL HIP ARTHROPLASTY     Right      Social History:   reports that he quit smoking about 29 years ago. His smoking use included cigarettes. He started smoking about 34 years ago. He has a 1.3 pack-year smoking history. He has been exposed to tobacco smoke. He has never used smokeless tobacco. He reports that he does not currently use alcohol. He reports that he does not use drugs.   Family History:  His family history includes Alzheimer's disease in his father and another family member; Diabetes in an other family member; Diabetes Mellitus II in his father; Hyperlipidemia in an other family member; Hypertension in an other family member; Stroke in an other family member; Thyroid disease in his mother.   Allergies No Known Allergies   Home Medications  Prior to Admission medications   Medication Sig Start Date End Date Taking? Authorizing Provider  albuterol (PROVENTIL HFA;VENTOLIN HFA) 108 (90 Base) MCG/ACT inhaler Inhale 2 puffs into the lungs every 6 (six) hours as needed for wheezing or shortness of breath. 04/09/17   Vassie Loll, MD  amLODipine (NORVASC) 10 MG tablet Take 1 tablet (10 mg total) by mouth daily. 03/13/23   Patwardhan, Anabel Bene, MD  aspirin EC 81 MG tablet Take 81 mg by mouth daily.    [provider]  atorvastatin (LIPITOR) 40 MG tablet Take 1 tablet (40 mg total) by mouth daily. 03/13/23   Patwardhan, Anabel Bene, MD  ferrous sulfate 325 (65 FE)  MG tablet Take 325 mg by mouth daily with breakfast.    [provider]  insulin aspart (NOVOLOG) 100 UNIT/ML injection  Inject 0-9 Units into the skin 4 (four) times daily -  before meals and at bedtime. 05/11/23   Lewie Chamber, MD  isosorbide-hydrALAZINE (BIDIL) 20-37.5 MG tablet Take 2 tablets by mouth 3 (three) times daily. 03/13/23   Patwardhan, Manish J, MD  latanoprost (XALATAN) 0.005 % ophthalmic solution Place 1 drop into both eyes at bedtime. 04/10/23   [provider]  metoprolol succinate (TOPROL-XL) 100 MG 24 hr tablet Take 1 tablet (100 mg total) by mouth daily. Take with or immediately following a meal. 03/13/23   Patwardhan, Manish J, MD  Multiple Vitamin (MULTIVITAMIN WITH MINERALS) TABS tablet Take 1 tablet by mouth daily. 08/16/18   Sheikh, Kateri Mc Latif, DO  nitroGLYCERIN (NITROSTAT) 0.4 MG SL tablet Place 1 tablet (0.4 mg total) under the tongue every 5 (five) minutes as needed for chest pain. 05/02/22   Patwardhan, Anabel Bene, MD    D/w wife in details about living will and future Mx like HD, intubation, and resuscitation    Critical care time: 60 min

## 2023-05-12 NOTE — Progress Notes (Signed)
eLink Physician-Brief Progress Note Patient Name: David [name] DOB: 1945-07-06 MRN: [id number]   Date of Service  05/12/2023  HPI/Events of Note  Just arrived from ER. CCM note reviewed. O2 sat 96 while on PRVC 100%, peep 10, RR 20, VT 500. BP and HR are good. Only on propofol. Just given lasix. RN requested some PRN fentnayl.   eICU Interventions  PRN fentanyl ordered  Has AM labs including ABG ordered Wean fio2 as tolerated Monitor lasix response Call E link if needed     Intervention Category Major Interventions: Respiratory failure - evaluation and management Evaluation Type: New Patient Evaluation  Oretha Milch 05/12/2023, 3:54 AM

## 2023-05-12 NOTE — Consult Note (Signed)
WOC Nurse Consult Note: this consult performed remotely after review of EMR including photo documentation  Reason for Consult: DTI heel L heel   Wound type: 1. Deep Tissue Pressure Injury  2. Stage 3 Pressure Injuries sacrum/upper buttocks 3.  Venous stasis changes of lower legs Pressure Injury POA: Yes Measurement: See nursing flowsheet  Wound bed: 1.  L heel purple maroon discoloration, skin appears intact  2.  Sacrum Stage 3 Pressure Injuries 50% pink moist 50% yellow  3.  Dry hyperkeratotic skin to lower legs  Drainage (amount, consistency, odor) see nursing flowsheet  Periwound: minimal moisture associated skin damage to surrounding skin buttocks/sacrum  Dressing procedure/placement/frequency:  Cleanse sacral wounds with NS, apply Medihoney to wound beds daily, cover with dry gauze and silicone foam. Coat surrounding skin of sacrum/buttocks with Gerhardt's Butt Cream 2 times a day and prn soiling.  Cleanse L heel with NS and apply Xeroform gauze [place] [phone]) to area of purple maroon discoloration daily, cover with dry gauze and Kerlix roll gauze.  Place L foot in Prevalon boot to offload pressure.    Apply Eucerin cream 2 times daily to skin of lower legs and feet.  DO NOT PLACE IN BETWEEN TOES.   POC discussed with bedside nurse. WOC team will not follow. Re-consult if further needs arise.   Thank you,    Priscella Mann MSN, RN-BC, [employer] [phone]

## 2023-05-12 NOTE — Progress Notes (Signed)
Pt tranported via ventilator by RN & RT without complications to 2M10 from TRAB

## 2023-05-13 DIAGNOSIS — J9601 Acute respiratory failure with hypoxia: Secondary | ICD-10-CM | POA: Diagnosis not present

## 2023-05-13 DIAGNOSIS — I509 Heart failure, unspecified: Secondary | ICD-10-CM | POA: Diagnosis not present

## 2023-05-13 LAB — CBC
HCT: 24.4 % — ABNORMAL LOW (ref 39.0–52.0)
Hemoglobin: 7.7 g/dL — ABNORMAL LOW (ref 13.0–17.0)
MCH: 33.6 pg (ref 26.0–34.0)
MCHC: 31.6 g/dL (ref 30.0–36.0)
MCV: 106.6 fL — ABNORMAL HIGH (ref 80.0–100.0)
Platelets: 198 10*3/uL (ref 150–400)
RBC: 2.29 MIL/uL — ABNORMAL LOW (ref 4.22–5.81)
RDW: 14.2 % (ref 11.5–15.5)
WBC: 11.2 10*3/uL — ABNORMAL HIGH (ref 4.0–10.5)
nRBC: 0 % (ref 0.0–0.2)

## 2023-05-13 LAB — HEMOGLOBIN AND HEMATOCRIT, BLOOD
HCT: 25.3 % — ABNORMAL LOW (ref 39.0–52.0)
Hemoglobin: 8 g/dL — ABNORMAL LOW (ref 13.0–17.0)

## 2023-05-13 LAB — GLUCOSE, CAPILLARY
Glucose-Capillary: 120 mg/dL — ABNORMAL HIGH (ref 70–99)
Glucose-Capillary: 120 mg/dL — ABNORMAL HIGH (ref 70–99)
Glucose-Capillary: 131 mg/dL — ABNORMAL HIGH (ref 70–99)
Glucose-Capillary: 137 mg/dL — ABNORMAL HIGH (ref 70–99)
Glucose-Capillary: 92 mg/dL (ref 70–99)
Glucose-Capillary: 97 mg/dL (ref 70–99)

## 2023-05-13 LAB — BASIC METABOLIC PANEL
Anion gap: 11 (ref 5–15)
BUN: 57 mg/dL — ABNORMAL HIGH (ref 8–23)
CO2: 21 mmol/L — ABNORMAL LOW (ref 22–32)
Calcium: 8.5 mg/dL — ABNORMAL LOW (ref 8.9–10.3)
Chloride: 106 mmol/L (ref 98–111)
Creatinine, Ser: 2.58 mg/dL — ABNORMAL HIGH (ref 0.61–1.24)
GFR, Estimated: 25 mL/min — ABNORMAL LOW (ref >60)
Glucose, Bld: 119 mg/dL — ABNORMAL HIGH (ref 70–99)
Potassium: 4.5 mmol/L (ref 3.5–5.1)
Sodium: 138 mmol/L (ref 135–145)

## 2023-05-13 LAB — MAGNESIUM: Magnesium: 2.5 mg/dL — ABNORMAL HIGH (ref 1.7–2.4)

## 2023-05-13 MED ORDER — OXYCODONE HCL 5 MG PO TABS
5.0000 mg | ORAL_TABLET | Freq: Four times a day (QID) | ORAL | Status: DC | PRN
Start: 2023-05-13 — End: 2023-05-14
  Administered 2023-05-13 – 2023-05-14 (×2): 5 mg via ORAL
  Filled 2023-05-13 (×2): qty 1

## 2023-05-13 MED ORDER — ACETAMINOPHEN 325 MG PO TABS: 650.0000 mg | ORAL_TABLET | Freq: Four times a day (QID) | ORAL | Status: DC | PRN

## 2023-05-13 MED ORDER — DEXMEDETOMIDINE HCL IN NACL 400 MCG/100ML IV SOLN
0.0000 ug/kg/h | INTRAVENOUS | Status: DC
  Administered 2023-05-13: 0.4 ug/kg/h via INTRAVENOUS
  Filled 2023-05-13 (×2): qty 100

## 2023-05-13 NOTE — Progress Notes (Signed)
Pt heart rate dropped to 60, so had to turn off Precedex. Elink notified

## 2023-05-13 NOTE — Progress Notes (Signed)
eLink Physician-Brief Progress Note Patient Name: David [name] DOB: [birth year] MRN: [id number]   Date of Service  05/13/2023  HPI/Events of Note  RN noted intermittent pain while trying to urinate but no issues with urine draining from the foley. Has been started on precedex. Comfortable on camera seen x 2. Asleep at this time. Has oxycodone ordered for pain as well.   eICU Interventions  Tylenol also ordered Try to avoid too many sedating agents. Extubated only at 3 pm     Intervention Category Major Interventions: Respiratory failure - evaluation and management  Oretha Milch 05/13/2023, 9:36 PM  Addendum at 4 am - asked to modify starting dose of precedex to 0.2 , currently ordered at 0.4. HR in 50s while on this and bp in 120s.

## 2023-05-13 NOTE — Procedures (Signed)
Extubation Procedure Note  Patient Details:   Name: David [name] DOB: 1946/03/15 MRN: [id number]   Airway Documentation:    Vent end date: 05/13/23 Vent end time: 1456   Evaluation  O2 sats: stable throughout Complications: No apparent complications Patient did tolerate procedure well. Bilateral Breath Sounds: Clear, Diminished   Yes  Pt extubated to bipap per order. Pt had a positive cuff leak, good cough, no stridor and able to state name.   Kerri Perches 05/13/2023, 2:56 PM

## 2023-05-13 NOTE — Progress Notes (Signed)
eLink Physician-Brief Progress Note Patient Name: David [name] DOB: 1945/07/31 MRN: [id number]   Date of Service  05/13/2023  HPI/Events of Note  Bedside RN asking if you want to repeat morning labs at 8, because the Hgb dropped from 9 yesterday to 7.7 . Platelet 198.  Discussed with RN VS stable, no bleeding.   Labs reviewed Cr 2.28, k is ok   eICU Interventions  Could have anemia of CKD Follow Hg at 9 AM, if dropping  < 7, consider anemia work up and transfusion       Intervention Category Intermediate Interventions: Other:  Ranee Gosselin 05/13/2023, 5:50 AM

## 2023-05-13 NOTE — Progress Notes (Signed)
Pt taken off bipap and placed on 4L [place] per MD. Bipap at bedside to be used while alseep.

## 2023-05-13 NOTE — Progress Notes (Signed)
Pt placed on bipap after extubation per MD. Pt is tolerating it well at this time. RT will continue to monitor as needed.   05/13/23 1457  Vent Select  Invasive or Noninvasive Noninvasive  Adult Vent Y  Adult Ventilator Settings  Vent Type Servo i  Humidity HME  Vent Mode BIPAP;PSV  FiO2 (%) 40 %  Pressure Support 10 cmH20  PEEP 5 cmH20  Adult Ventilator Measurements  Peak Airway Pressure 15 L/min  Mean Airway Pressure 7 cmH20  Resp Rate Spontaneous 22 br/min  Resp Rate Total 22 br/min  Spont TV 717 mL  Measured Ve 16.3 L  Auto PEEP 0 cmH20  Total PEEP 5 cmH20

## 2023-05-13 NOTE — Progress Notes (Signed)
NAME:  David [name], MRN:  [id number], DOB:  Dec 08, 1945, LOS: 1 ADMISSION DATE:  05/11/2023, CONSULTATION DATE:  05/11/2023 REFERRING MD:  Gunnar Bulla, MD, CHIEF COMPLAINT:  resp distress last night   History of Present Illness:  A 77 y.o. male patient with a PMHx of CAD (PCI), HTN, dyslipidemia, chronic systolic/diastolic CHF (May 05, 2023: Echo EF 40-45%), pulmonary hypertension (51 mmHg), anemia of chronic illness, DM-2, neuropathy, CKD-4, and possible OSA who was discharged yesterday for CHF exacerbation, acute hypoxic resp failure, and PNA, who presents from NH after being there for 4 hrs with acute respiratory distress.  EMS gave some albuterol, and he did not improve his respiratory status, when EMS arrived, he was saturating in the 60s, and they put him on CPAP and he only improved to the 70s. He failed NRM. They gave him 2 sublingual nitroglycerin for HTN. He was hypothermic to 35.8 F, mildly tachycardic, and hypertensive to 140s 150s with tachypnea accessory muscle use and respiratory failure despite CPAP ventilation. He became lethargic, so he was intubated. Wife at the bedside denied choking but she noticed that he has apneas and last sleep eval was 2019. He quit smoking many yrs ago and he smoked few cigarettes daily.    Pertinent  Medical History  CAD (PCI), HTN, dyslipidemia, chronic systolic/diastolic CHF (May 05, 2023: Echo EF 40-45%), pulmonary hypertension (51 mmHg), anemia of chronic illness, DM-2, CKD-4,  possible OSA, peripheral neuropathy, diabatic foot amputations   Significant Hospital Events: Including procedures, antibiotic start and stop dates in addition to other pertinent events   12/24Given Lasix 80 mg IV in ED 12/24 Intubated on Propofol  12/24 thoracentesis  Interim History / Subjective:  No overnight issues Thoracentesis almost 2L off.   Objective   Blood pressure (!) 149/62, pulse 90, temperature 99 F (37.2 C), resp. rate (!) 21, height 5\' 11"  (1.803  m), weight 100.1 kg, SpO2 100%.    Vent Mode: CPAP;PSV FiO2 (%):  [40 %-50 %] 40 % Set Rate:  [20 bmp] 20 bmp Vt Set:  [520 mL] 520 mL PEEP:  [8 cmH20-10 cmH20] 8 cmH20 Pressure Support:  [15 cmH20] 15 cmH20 Plateau Pressure:  [27 cmH20-35 cmH20] 27 cmH20   Intake/Output Summary (Last 24 hours) at 05/13/2023 7829 Last data filed at 05/13/2023 0300 Gross per 24 hour  Intake 1487 ml  Output 1300 ml  Net 187 ml   Filed Weights   05/12/23 0400 05/13/23 0045 05/13/23 0500  Weight: 103.5 kg 101.5 kg 100.1 kg    Examination: Gen:      Intubated, sedated, acutely ill appearing HEENT:  ETT to vent Lungs:    sounds of mechanical ventilation auscultated no wheeze, diminished right lung base CV:         RRR no mrg Abd:      + bowel sounds; soft, non-tender; no palpable masses, no distension Ext:    No edema Skin:      Warm and dry; no rashes Neuro:   awake, alert, follow commands  Pleural fluid studies c/w transudative effusion   Resolved Hospital Problem list     Assessment & Plan:  Acute hypoxemic respiratory failure Right pleural effusion s/p thoracentesis 12/24 HAP - minimal response to lasix - hold diuresis for now - continue cefepime for HAP - unable to obtain tracheal aspirate due to minimal secretions - will hold propofol, switch to fentanyl/precedex to facilitate extubation  CAD (PCI), HTN, dyslipidemia, chronic systolic/diastolic CHF (May 05, 2023: Echo EF 40-45%),  pulmonary hypertension (51 mmHg) Chronic HFrEF - Resume home meds - hold diuresis given AKI  Anemia of chronic illness Monitor CBC Recent anemia w/u was noted  DM-2 (well controlled, HbA1c 6.4%) Glycemic control SSI  Peripheral neuropathy Pain Mx after extubation  CKD-4 Monitor BMP I/O chart IV Lasix Renal U/S  Possible OSA Needs NPSG as outpatient   Best Practice (right click and "Reselect all SmartList Selections" daily)   Diet/type: NPO w/ oral meds DVT prophylaxis prophylactic  heparin  Pressure ulcer(s): N/A GI prophylaxis: H2B Lines: N/A Foley:  Yes, and it is still needed Code Status:  full code Last date of multidisciplinary goals of care discussion [wife lauren updated by phone 12/25 am.]   Critical care time:     The patient is critically ill due to respiratory failure.  Critical care was necessary to treat or prevent imminent or life-threatening deterioration.  Critical care was time spent personally by me on the following activities: development of treatment plan with patient and/or surrogate as well as nursing, discussions with consultants, evaluation of patient's response to treatment, examination of patient, obtaining history from patient or surrogate, ordering and performing treatments and interventions, ordering and review of laboratory studies, ordering and review of radiographic studies, pulse oximetry, re-evaluation of patient's condition and participation in multidisciplinary rounds.   Critical Care Time devoted to patient care services described in this note is 42 minutes. This time reflects time of care of this signee Charlott Holler . This critical care time does not reflect separately billable procedures or procedure time, teaching time or supervisory time of PA/NP/Med student/Med Resident etc but could involve care discussion time.       Charlott Holler [facility] 05/13/2023 9:18 AM  Pager: see AMION  If no response to pager , please call critical care on call (see AMION) until 7pm After 7:00 pm call Elink

## 2023-05-13 NOTE — Plan of Care (Addendum)
Received care of pt in bed on ventilator, pt medicated with prn Fentanyl for vent desynchrony and restlessness. Propofol increased for comfort and sedation. VSS, afebrile. Foley patent and intact draining clear yellow urine, good uop. Wife at bedside. OGT patent and intact to LIWS, with clear bilious output. No s/s distress at this time will continue to monitor. Drop in Hgb, MD notified repeat H&H scheduled for 0900. No s/s of bleeding.

## 2023-05-14 ENCOUNTER — Encounter (HOSPITAL_COMMUNITY): Payer: Self-pay | Admitting: Pulmonary Disease

## 2023-05-14 LAB — MAGNESIUM: Magnesium: 2.6 mg/dL — ABNORMAL HIGH (ref 1.7–2.4)

## 2023-05-14 LAB — CBC
HCT: 22.7 % — ABNORMAL LOW (ref 39.0–52.0)
Hemoglobin: 7 g/dL — ABNORMAL LOW (ref 13.0–17.0)
MCH: 33.8 pg (ref 26.0–34.0)
MCHC: 30.8 g/dL (ref 30.0–36.0)
MCV: 109.7 fL — ABNORMAL HIGH (ref 80.0–100.0)
Platelets: 214 10*3/uL (ref 150–400)
RBC: 2.07 MIL/uL — ABNORMAL LOW (ref 4.22–5.81)
RDW: 14.6 % (ref 11.5–15.5)
WBC: 10.5 10*3/uL (ref 4.0–10.5)
nRBC: 0 % (ref 0.0–0.2)

## 2023-05-14 LAB — BASIC METABOLIC PANEL
Anion gap: 11 (ref 5–15)
BUN: 57 mg/dL — ABNORMAL HIGH (ref 8–23)
CO2: 20 mmol/L — ABNORMAL LOW (ref 22–32)
Calcium: 8.6 mg/dL — ABNORMAL LOW (ref 8.9–10.3)
Chloride: 108 mmol/L (ref 98–111)
Creatinine, Ser: 2.61 mg/dL — ABNORMAL HIGH (ref 0.61–1.24)
GFR, Estimated: 25 mL/min — ABNORMAL LOW (ref >60)
Glucose, Bld: 98 mg/dL (ref 70–99)
Potassium: 4.6 mmol/L (ref 3.5–5.1)
Sodium: 139 mmol/L (ref 135–145)

## 2023-05-14 LAB — GLUCOSE, CAPILLARY
Glucose-Capillary: 97 mg/dL (ref 70–99)
Glucose-Capillary: 138 mg/dL — ABNORMAL HIGH (ref 70–99)
Glucose-Capillary: 162 mg/dL — ABNORMAL HIGH (ref 70–99)
Glucose-Capillary: 206 mg/dL — ABNORMAL HIGH (ref 70–99)
Glucose-Capillary: 113 mg/dL — ABNORMAL HIGH (ref 70–99)

## 2023-05-14 LAB — CYTOLOGY - NON PAP

## 2023-05-14 MED ORDER — FERROUS SULFATE 325 (65 FE) MG PO TABS
325.0000 mg | ORAL_TABLET | Freq: Every day | ORAL | Status: DC
  Administered 2023-05-15 – 2023-05-25 (×11): 325 mg via ORAL
  Filled 2023-05-14 (×11): qty 1

## 2023-05-14 MED ORDER — QUETIAPINE FUMARATE 50 MG PO TABS: 50.0000 mg | ORAL_TABLET | Freq: Every evening | ORAL | Status: DC | PRN

## 2023-05-14 MED ORDER — OXYCODONE HCL 5 MG PO TABS
5.0000 mg | ORAL_TABLET | Freq: Four times a day (QID) | ORAL | Status: DC | PRN
Start: 2023-05-14 — End: 2023-05-14

## 2023-05-14 MED ORDER — INSULIN ASPART 100 UNIT/ML IJ SOLN
0.0000 [IU] | Freq: Three times a day (TID) | INTRAMUSCULAR | Status: DC
  Administered 2023-05-14: 5 [IU] via SUBCUTANEOUS
  Administered 2023-05-15: 3 [IU] via SUBCUTANEOUS

## 2023-05-14 MED ORDER — POLYETHYLENE GLYCOL 3350 17 G PO PACK
17.0000 g | PACK | Freq: Every day | ORAL | Status: DC | PRN
  Administered 2023-05-20 – 2023-05-23 (×2): 17 g via ORAL
  Filled 2023-05-14 (×3): qty 1

## 2023-05-14 MED ORDER — ACETAMINOPHEN 325 MG PO TABS
650.0000 mg | ORAL_TABLET | Freq: Four times a day (QID) | ORAL | Status: DC | PRN
  Administered 2023-05-21 – 2023-05-22 (×2): 650 mg via ORAL
  Filled 2023-05-14 (×2): qty 2

## 2023-05-14 MED ORDER — OXYCODONE HCL 5 MG PO TABS
5.0000 mg | ORAL_TABLET | Freq: Four times a day (QID) | ORAL | Status: AC | PRN
Start: 2023-05-14 — End: 2023-05-16
  Administered 2023-05-15 – 2023-05-16 (×2): 5 mg via ORAL
  Filled 2023-05-14 (×2): qty 1

## 2023-05-14 MED ORDER — FERROUS SULFATE 300 (60 FE) MG/5ML PO SOLN: 300.0000 mg | Freq: Every day | ORAL | Status: DC

## 2023-05-14 MED ORDER — FUROSEMIDE 10 MG/ML IJ SOLN
40.0000 mg | Freq: Two times a day (BID) | INTRAMUSCULAR | Status: AC
  Administered 2023-05-14 (×2): 40 mg via INTRAVENOUS
  Filled 2023-05-14 (×2): qty 4

## 2023-05-14 MED ORDER — METOPROLOL TARTRATE 25 MG PO TABS: 50.0000 mg | ORAL_TABLET | Freq: Two times a day (BID) | ORAL | Status: DC

## 2023-05-14 MED ORDER — DOCUSATE SODIUM 100 MG PO CAPS: 100.0000 mg | ORAL_CAPSULE | Freq: Two times a day (BID) | ORAL | Status: DC

## 2023-05-14 MED ORDER — AMLODIPINE BESYLATE 10 MG PO TABS: 10.0000 mg | ORAL_TABLET | Freq: Every day | ORAL | Status: DC

## 2023-05-14 MED ORDER — FAMOTIDINE 20 MG PO TABS: 20.0000 mg | ORAL_TABLET | Freq: Every day | ORAL | Status: DC

## 2023-05-14 MED ORDER — ATORVASTATIN CALCIUM 40 MG PO TABS
40.0000 mg | ORAL_TABLET | Freq: Every day | ORAL | Status: DC
  Administered 2023-05-15 – 2023-05-25 (×11): 40 mg via ORAL
  Filled 2023-05-14 (×12): qty 1

## 2023-05-14 MED ORDER — POLYETHYLENE GLYCOL 3350 17 G PO PACK: 17.0000 g | PACK | Freq: Every day | ORAL | Status: DC

## 2023-05-14 MED ORDER — DOCUSATE SODIUM 100 MG PO CAPS
100.0000 mg | ORAL_CAPSULE | Freq: Every day | ORAL | Status: DC | PRN
  Administered 2023-05-22: 100 mg via ORAL
  Filled 2023-05-14 (×3): qty 1

## 2023-05-14 MED ORDER — ISOSORB DINITRATE-HYDRALAZINE 20-37.5 MG PO TABS
2.0000 | ORAL_TABLET | Freq: Three times a day (TID) | ORAL | Status: DC
  Administered 2023-05-14 – 2023-05-25 (×33): 2 via ORAL
  Filled 2023-05-14 (×35): qty 2

## 2023-05-14 MED ORDER — ACETAMINOPHEN 325 MG PO TABS: 650.0000 mg | ORAL_TABLET | Freq: Four times a day (QID) | ORAL | Status: DC | PRN

## 2023-05-14 MED ORDER — ASPIRIN 81 MG PO TBEC
81.0000 mg | DELAYED_RELEASE_TABLET | Freq: Every day | ORAL | Status: DC
  Administered 2023-05-15 – 2023-05-25 (×11): 81 mg via ORAL
  Filled 2023-05-14 (×12): qty 1

## 2023-05-14 NOTE — Progress Notes (Signed)
Patient requesting Xanax 1 mg for sleep. Dr. Lonzo Candy notified of pt request. Dr. Lonzo Candy did not provide order d/t OSA and bradycardia.

## 2023-05-14 NOTE — TOC Progression Note (Signed)
Transition of Care [facility]) - Progression Note    Patient Details  Name: David [name] MRN: [id number] Date of Birth: [birth year]  Transition of Care [facility]) CM/SW Contact  Michaela Corner, [place] Phone Number: 05/14/2023, 4:21 PM  Clinical Narrative:   CSW spoke with pts wife. She would like to look at pt going to [facility]. CSW explained [facility] can only accept pts on 3L of O2 or less. If pt is weened, CSW will look at sending referral to [facility]. CSW asked pts spouse if [facility] is unable to accept can pt return to [facility], pts wife agreed.          Expected Discharge Plan and Services                                               Social Determinants of Health (SDOH) Interventions SDOH Screenings   Food Insecurity: No Food Insecurity (05/14/2023)  Housing: Low Risk  (05/14/2023)  Transportation Needs: No Transportation Needs (05/14/2023)  Recent Concern: Transportation Needs - Unmet Transportation Needs (05/05/2023)  Utilities: Not At Risk (05/14/2023)  Financial Resource Strain: Low Risk  (08/20/2017)  Physical Activity: Inactive (08/20/2017)  Social Connections: Socially Integrated (12/19/2017)  Stress: No Stress Concern Present (08/20/2017)  Tobacco Use: Medium Risk (05/14/2023)    Readmission Risk Interventions     No data to display

## 2023-05-14 NOTE — Plan of Care (Signed)
  Problem: Nutritional: Goal: Maintenance of adequate nutrition will improve Outcome: Not Progressing   Problem: Health Behavior/Discharge Planning: Goal: Ability to manage health-related needs will improve Outcome: Not Progressing   Problem: Nutrition: Goal: Adequate nutrition will be maintained Outcome: Not Progressing

## 2023-05-14 NOTE — Evaluation (Signed)
Physical Therapy Evaluation Patient Details Name: David [name] MRN: [id number] DOB: Nov 17, 1945 Today's Date: 05/14/2023  History of Present Illness  77 y.o. male presented 05/11/23 with Acute hypoxemic respiratory failure,  Acute pulmonary edema,  HAP,  Right pleural effusion s/p thoracentesis.  PMHx of CAD (PCI), HTN, dyslipidemia, chronic systolic/diastolic CHF, pulmonary hypertension, anemia of chronic illness, DM-2, neuropathy, CKD-4, and possible OSA.    Clinical Impression  Pt admitted with above diagnosis. From SNF but did not stay long enough after previous hospital d/c to initiate PT services. Pt currently requires up to mod assist for bed mobility. He became bradycardic upon sitting EOB (38 bpm) and lethargic. See vital details below. Assisted back to bed, and vitals improved, tried egress mode on bed, pt sitting in chair position VSS but still having intermittent spasms, reportedly in the penis. Tolerated LE exercise well and encouraged performance multiple times per day to reduce atrophy during hospitalization. Pt currently with functional limitations due to the deficits listed below (see PT Problem List). Pt will benefit from acute skilled PT to increase their independence and safety with mobility to allow discharge.      Pre activity reclined in bed: BP 85/73, HR 61, SpO2 92% on 5L, asymptomatic. After sitting EOB, pt became lethargic and bradycardic down to 38 at nadir, with BP recorded at 96/72, SpO2 89% on 5L, still responsive, not dizzy. After returning to supine, pt HR improved to 61 again, BP 123/61, SpO2 91% on 5L     If plan is discharge home, recommend the following: Two people to help with walking and/or transfers;Assist for transportation;Help with stairs or ramp for entrance;Two people to help with bathing/dressing/bathroom;Assistance with cooking/housework   Can travel by private vehicle   No    Equipment Recommendations None recommended by PT (TBD next venue)   Recommendations for Other Services  OT consult    Functional Status Assessment Patient has had a recent decline in their functional status and demonstrates the ability to make significant improvements in function in a reasonable and predictable amount of time.     Precautions / Restrictions Precautions Precautions: Fall Restrictions Weight Bearing Restrictions Per Provider Order: No      Mobility  Bed Mobility Overal bed mobility: Needs Assistance Bed Mobility: Rolling, Sidelying to Sit, Sit to Sidelying Rolling: Min assist Sidelying to sit: Mod assist, Used rails, HOB elevated     Sit to sidelying: Mod assist, Used rails General bed mobility comments: Min assist to roll to Lt side, moving LEs >75% of the way off of the bed with slight assist on Rt towards the end. Mod assist for trunk support to rise and reposition squarely on EOB. Mod assist to bring LEs back into bed, cues for technique to lie back onto side using rail.    Transfers                   General transfer comment: Deferred due to drop in HR.    Ambulation/Gait                  Stairs            Wheelchair Mobility     Tilt Bed    Modified Rankin (Stroke Patients Only)       Balance Overall balance assessment: Needs assistance Sitting-balance support: Single extremity supported, Feet supported Sitting balance-Leahy Scale: Poor Sitting balance - Comments: Progressed to CGA EOB, but developed fatigue (bradycardic) and started leaning onto Lt elbow for comfort.  Postural control: Left lateral lean                                   Pertinent Vitals/Pain Pain Assessment Pain Assessment: Faces Faces Pain Scale: Hurts whole lot Pain Location: Penis, spasms Pain Descriptors / Indicators: Moaning, Spasm Pain Intervention(s): Limited activity within patient's tolerance, Monitored during session, Repositioned    Home Living Family/patient expects to be discharged  to:: Skilled nursing facility Living Arrangements: Spouse/significant other Available Help at Discharge: Family;Available PRN/intermittently Type of Home: House Home Access: Stairs to enter Entrance Stairs-Rails: Doctor, general practice of Steps: 3 (pt reports he goes up on his bottom) Alternate Level Stairs-Number of Steps: full flight, typically backs up the stairs on his bottom Home Layout: Two level;Able to live on main level with bedroom/bathroom Home Equipment: Rolling Walker (2 wheels);Cane - single point;Shower seat;Wheelchair - manual      Prior Function Prior Level of Function : Needs assist             Mobility Comments: Using quad cane in and out of the home. Has not mobilized much since February. Can only walk short distances and does not use RW because he has to go sideways in the hall. Did not stay at SNF long enough for PT, or he doesn't remember. ADLs Comments: per previous record--Spouse assist with community mobility, ADL, iADL, and medication management. Per pt today, wife assists with IADL     Extremity/Trunk Assessment   Upper Extremity Assessment Upper Extremity Assessment: Defer to OT evaluation    Lower Extremity Assessment Lower Extremity Assessment: Generalized weakness RLE Deficits / Details: edemat LLE Deficits / Details: edemat Lt heel bandaged       Communication   Communication Communication: No apparent difficulties  Cognition Arousal: Alert Behavior During Therapy: WFL for tasks assessed/performed Overall Cognitive Status: Within Functional Limits for tasks assessed                                          General Comments General comments (skin integrity, edema, etc.): Pre activity reclined in bed supine: BP85/73, HR 61, SpO2 92% on 5L, asymptomatic. After sitting EOB, pt became lethargic and bradycardic down to 38 at nadir, with BP recorded at 96/72, SpO2 89% on 5L, still responsive, not dizzy. After  returning to supine, pt HR improved to 61 again, BP 123/61, SpO2 91% on 5L    Exercises General Exercises - Lower Extremity Ankle Circles/Pumps: AROM, Both, 15 reps, Supine ((limited on Lt)) Quad Sets: Strengthening, Both, 10 reps, Supine Gluteal Sets: Strengthening, Both, 10 reps, Supine Short Arc Quad: Strengthening, Both, 5 reps, Supine Hip ABduction/ADduction: Strengthening, Both, 10 reps, Supine   Assessment/Plan    PT Assessment Patient needs continued PT services  PT Problem List Decreased strength;Decreased activity tolerance;Decreased balance;Decreased mobility;Decreased knowledge of use of DME;Decreased cognition;Decreased knowledge of precautions;Pain;Decreased range of motion;Impaired sensation;Cardiopulmonary status limiting activity;Decreased skin integrity       PT Treatment Interventions DME instruction;Gait training;Functional mobility training;Stair training;Therapeutic activities;Therapeutic exercise;Wheelchair mobility training;Balance training;Neuromuscular re-education;Patient/family education    PT Goals (Current goals can be found in the Care Plan section)  Acute Rehab PT Goals Patient Stated Goal: Get well PT Goal Formulation: With patient Time For Goal Achievement: 05/28/23 Potential to Achieve Goals: Fair    Frequency Min 1X/week  Co-evaluation               AM-PAC PT "6 Clicks" Mobility  Outcome Measure Help needed turning from your back to your side while in a flat bed without using bedrails?: A Little Help needed moving from lying on your back to sitting on the side of a flat bed without using bedrails?: A Lot Help needed moving to and from a bed to a chair (including a wheelchair)?: A Lot Help needed standing up from a chair using your arms (e.g., wheelchair or bedside chair)?: Total Help needed to walk in hospital room?: Total Help needed climbing 3-5 steps with a railing? : Total 6 Click Score: 10    End of Session Equipment  Utilized During Treatment: Gait belt;Oxygen Activity Tolerance: Treatment limited secondary to medical complications (Comment) (Bradycardic after sitting EOB (38 bpm)) Patient left: with call bell/phone within reach;in bed;with bed alarm set;with nursing/sitter in room (1/4 turn onto Rt side, chair position in bed) Nurse Communication: Mobility status;Other (comment) (bradycardic, rn aware in room) PT Visit Diagnosis: Muscle weakness (generalized) (M62.81);Difficulty in walking, not elsewhere classified (R26.2);Pain Pain - part of body:  (Penis)    Time: 1610-9604 PT Time Calculation (min) (ACUTE ONLY): 49 min   Charges:   PT Evaluation $PT Eval Moderate Complexity: 1 Mod PT Treatments $Therapeutic Exercise: 8-22 mins $Therapeutic Activity: 8-22 mins PT General Charges $$ ACUTE PT VISIT: 1 Visit         Kathlyn Sacramento, PT, DPT [facility]  Rehabilitation Services Physical Therapist Office: [phone] Website: [email or web address]   Berton Mount 05/14/2023, 12:40 PM

## 2023-05-14 NOTE — Progress Notes (Signed)
Patient HR dropped to lowest 36 while sitting edge of bed with PT. Patient states he is just tired but denies chest pain. This RN paged CCM Celine Mans, in which an EKG was ordered. EKG performed and placed in chart. Future metoprolol dose held and MD approval to continue with patient transfer to [facility]. Receiving RN made aware of low HR event.

## 2023-05-14 NOTE — Progress Notes (Signed)
Notified by nursing for bradycardia down to 36 while working with PT. Patient endorsed feeling fatigued, no chest pain.   12 lead EKG shows sinus bradycardia HR 61, prolonged PR interval. Patient also get bradycardic with precedex overnight. Will hold metoprolol and amlodipine for now. These can be gradually folded back in. If he has recurrence of bradycardia while off CCB/BB, consider cardiology consultation to make sure he doesn't have any conduction abnormalities.   Ok for transfer to progressive with telemetry.

## 2023-05-14 NOTE — Progress Notes (Signed)
Pt currently off BiPAP and on 4L [place]. Pt tolerating well with SVS. BiPAP (servo) is on stby at bedside.

## 2023-05-14 NOTE — Progress Notes (Signed)
NAME:  David [name], MRN:  [id number], DOB:  1946/03/23, LOS: 2 ADMISSION DATE:  05/11/2023, CONSULTATION DATE:  05/11/2023 REFERRING MD:  Gunnar Bulla, MD, CHIEF COMPLAINT:  resp distress last night   History of Present Illness:  A 77 y.o. male patient with a PMHx of CAD (PCI), HTN, dyslipidemia, chronic systolic/diastolic CHF (May 05, 2023: Echo EF 40-45%), pulmonary hypertension (51 mmHg), anemia of chronic illness, DM-2, neuropathy, CKD-4, and possible OSA who was discharged yesterday for CHF exacerbation, acute hypoxic resp failure, and PNA, who presents from NH after being there for 4 hrs with acute respiratory distress.  EMS gave some albuterol, and he did not improve his respiratory status, when EMS arrived, he was saturating in the 60s, and they put him on CPAP and he only improved to the 70s. He failed NRM. They gave him 2 sublingual nitroglycerin for HTN. He was hypothermic to 35.8 F, mildly tachycardic, and hypertensive to 140s 150s with tachypnea accessory muscle use and respiratory failure despite CPAP ventilation. He became lethargic, so he was intubated. Wife at the bedside denied choking but she noticed that he has apneas and last sleep eval was 2019. He quit smoking many yrs ago and he smoked few cigarettes daily.    Pertinent  Medical History  CAD (PCI), HTN, dyslipidemia, chronic systolic/diastolic CHF (May 05, 2023: Echo EF 40-45%), pulmonary hypertension (51 mmHg), anemia of chronic illness, DM-2, CKD-4,  possible OSA, peripheral neuropathy, diabatic foot amputations   Significant Hospital Events: Including procedures, antibiotic start and stop dates in addition to other pertinent events   12/24Given Lasix 80 mg IV in ED 12/24 Intubated on Propofol  12/24 thoracentesis  Interim History / Subjective:  Extubated yesterday afternoon. Did ok on bipap for 1-2 hours and then about 3 hours overnight before removing it himself.   Objective   Blood pressure 139/76, pulse (!)  56, temperature 98.6 F (37 C), resp. rate 15, height 5\' 11"  (1.803 m), weight 98.3 kg, SpO2 93%.    Vent Mode: BIPAP;PSV FiO2 (%):  [40 %] 40 % PEEP:  [5 cmH20] 5 cmH20 Pressure Support:  [5 cmH20-10 cmH20] 10 cmH20   Intake/Output Summary (Last 24 hours) at 05/14/2023 1102 Last data filed at 05/14/2023 1000 Gross per 24 hour  Intake 333.44 ml  Output 900 ml  Net -566.56 ml   Filed Weights   05/13/23 0045 05/13/23 0500 05/14/23 0500  Weight: 101.5 kg 100.1 kg 98.3 kg    Examination: Sitting up on nasal cannula No respiratory distress Bibasilar crackles, no wheeze No peripheral edema   Pleural fluid studies c/w transudative effusion  Na 139 K 4.6 Cr 2.61 WBC 10.5 Hgb 7.0 PCT >1 CBGs wnl  Resolved Hospital Problem list     Assessment & Plan:  Acute hypoxemic respiratory failure Acute pulmonary edema HAP Right pleural effusion s/p thoracentesis - continue cefepime for HAP x 5 days - unable to obtain tracheal aspirate due to minimal secretions - needs OOB, incentive spirometry, pulmonary toilet to come down on oxygen support - prn nocturnal bipap  CAD (PCI), HTN, dyslipidemia, chronic systolic/diastolic CHF (May 05, 2023: Echo EF 40-45%), pulmonary hypertension (51 mmHg) Chronic HFrEF - Resume home meds - hold diuresis given AKI  Anemia of chronic illness Monitor CBC Recent anemia w/u was noted  DM-2 (well controlled, HbA1c 6.4%) Glycemic control SSI  Peripheral neuropathy Pain meds  CKD-4 Monitor BMP I/O chart IV Lasix Renal U/S  Possible OSA Needs NPSG as outpatient   Best Practice (  right click and "Reselect all SmartList Selections" daily)   Diet/type: NPO w/ oral meds DVT prophylaxis prophylactic heparin  Pressure ulcer(s): N/A GI prophylaxis: H2B Lines: N/A Foley:  Yes, and it is still needed Code Status:  full code Last date of multidisciplinary goals of care discussion [wife lauren updated 12/25 at bedside]  I spent 50 minutes  in total visit time for this patient, with more than 50% spent counseling/coordinating care. Will sign out to [facility] to assume care 12/27, transfer to progressive  Durel Salts, MD Pulmonary and Critical Care Medicine [facility] 05/14/2023 11:05 AM Pager: see AMION  If no response to pager, please call critical care on call (see AMION) until 7pm After 7:00 pm call Elink

## 2023-05-15 DIAGNOSIS — I509 Heart failure, unspecified: Secondary | ICD-10-CM | POA: Diagnosis not present

## 2023-05-15 LAB — BASIC METABOLIC PANEL
Anion gap: 12 (ref 5–15)
BUN: 62 mg/dL — ABNORMAL HIGH (ref 8–23)
CO2: 20 mmol/L — ABNORMAL LOW (ref 22–32)
Calcium: 8.6 mg/dL — ABNORMAL LOW (ref 8.9–10.3)
Chloride: 108 mmol/L (ref 98–111)
Creatinine, Ser: 3.17 mg/dL — ABNORMAL HIGH (ref 0.61–1.24)
GFR, Estimated: 20 mL/min — ABNORMAL LOW (ref >60)
Glucose, Bld: 119 mg/dL — ABNORMAL HIGH (ref 70–99)
Potassium: 4.4 mmol/L (ref 3.5–5.1)
Sodium: 140 mmol/L (ref 135–145)

## 2023-05-15 LAB — CBC
HCT: 22.5 % — ABNORMAL LOW (ref 39.0–52.0)
Hemoglobin: 7.1 g/dL — ABNORMAL LOW (ref 13.0–17.0)
MCH: 34.6 pg — ABNORMAL HIGH (ref 26.0–34.0)
MCHC: 31.6 g/dL (ref 30.0–36.0)
MCV: 109.8 fL — ABNORMAL HIGH (ref 80.0–100.0)
Platelets: 248 10*3/uL (ref 150–400)
RBC: 2.05 MIL/uL — ABNORMAL LOW (ref 4.22–5.81)
RDW: 14.4 % (ref 11.5–15.5)
WBC: 10.6 10*3/uL — ABNORMAL HIGH (ref 4.0–10.5)
nRBC: 0 % (ref 0.0–0.2)

## 2023-05-15 LAB — MAGNESIUM: Magnesium: 2.8 mg/dL — ABNORMAL HIGH (ref 1.7–2.4)

## 2023-05-15 LAB — BODY FLUID CULTURE W GRAM STAIN
Culture: NO GROWTH
Gram Stain: NONE SEEN

## 2023-05-15 LAB — GLUCOSE, CAPILLARY
Glucose-Capillary: 112 mg/dL — ABNORMAL HIGH (ref 70–99)
Glucose-Capillary: 175 mg/dL — ABNORMAL HIGH (ref 70–99)
Glucose-Capillary: 148 mg/dL — ABNORMAL HIGH (ref 70–99)
Glucose-Capillary: 153 mg/dL — ABNORMAL HIGH (ref 70–99)

## 2023-05-15 MED ORDER — INSULIN ASPART 100 UNIT/ML IJ SOLN
0.0000 [IU] | Freq: Every day | INTRAMUSCULAR | Status: DC
  Administered 2023-05-19 – 2023-05-24 (×3): 2 [IU] via SUBCUTANEOUS

## 2023-05-15 MED ORDER — INSULIN ASPART 100 UNIT/ML IJ SOLN
0.0000 [IU] | Freq: Three times a day (TID) | INTRAMUSCULAR | Status: DC
  Administered 2023-05-16 (×2): 2 [IU] via SUBCUTANEOUS
  Administered 2023-05-16: 5 [IU] via SUBCUTANEOUS
  Administered 2023-05-17: 2 [IU] via SUBCUTANEOUS
  Administered 2023-05-17 – 2023-05-18 (×5): 3 [IU] via SUBCUTANEOUS
  Administered 2023-05-19: 5 [IU] via SUBCUTANEOUS
  Administered 2023-05-19 – 2023-05-20 (×2): 3 [IU] via SUBCUTANEOUS
  Administered 2023-05-20: 5 [IU] via SUBCUTANEOUS
  Administered 2023-05-20: 3 [IU] via SUBCUTANEOUS
  Administered 2023-05-21: 5 [IU] via SUBCUTANEOUS
  Administered 2023-05-21: 2 [IU] via SUBCUTANEOUS
  Administered 2023-05-21 – 2023-05-23 (×5): 3 [IU] via SUBCUTANEOUS
  Administered 2023-05-23: 2 [IU] via SUBCUTANEOUS
  Administered 2023-05-24: 5 [IU] via SUBCUTANEOUS
  Administered 2023-05-24: 3 [IU] via SUBCUTANEOUS
  Administered 2023-05-24 – 2023-05-25 (×2): 2 [IU] via SUBCUTANEOUS

## 2023-05-15 MED ORDER — FUROSEMIDE 10 MG/ML IJ SOLN: 40.0000 mg | Freq: Every day | INTRAMUSCULAR | Status: DC

## 2023-05-15 MED ORDER — TRAZODONE HCL 100 MG PO TABS
100.0000 mg | ORAL_TABLET | Freq: Every day | ORAL | Status: DC
Start: 2023-05-15 — End: 2023-05-25
  Administered 2023-05-15 – 2023-05-24 (×9): 100 mg via ORAL
  Filled 2023-05-15 (×10): qty 1

## 2023-05-15 MED ORDER — TRAZODONE HCL 50 MG PO TABS: 50.0000 mg | ORAL_TABLET | Freq: Every day | ORAL | Status: DC

## 2023-05-15 NOTE — Progress Notes (Signed)
PROGRESS NOTE    [name]  VPX:[id number] DOB: [birth year] DOA: 05/11/2023 PCP: Renford Dills, MD  Chief Complaint  Patient presents with   Respiratory Distress    Brief Narrative:   David [name] 77 y.o. male patient with David [name] PMHx of CAD (PCI), HTN, dyslipidemia, chronic systolic/diastolic CHF (May 05, 2023: Echo EF 40-45%), pulmonary hypertension (51 mmHg), anemia of chronic illness, DM-2, neuropathy, CKD-4, and possible OSA who was discharged yesterday for CHF exacerbation, acute hypoxic resp failure, and PNA, who presents from NH after being there for 4 hrs with acute respiratory distress. EMS gave some albuterol, and he did not improve his respiratory status, when EMS arrived, he was saturating in the 60s, and they put him on CPAP and he only improved to the 70s. He failed NRM. They gave him 2 sublingual nitroglycerin for HTN. He was hypothermic to 35.8 F, mildly tachycardic, and hypertensive to 140s 150s with tachypnea accessory muscle use and respiratory failure despite CPAP ventilation. He became lethargic, so he was intubated. Wife at the bedside denied choking but she noticed that he has apneas and last sleep eval was 2019. He quit smoking many yrs ago and he smoked few cigarettes daily.   Significant Events 12/24Given Lasix 80 mg IV in ED 12/24 Intubated on Propofol  12/24 thoracentesis  Assessment & Plan:   Principal Problem:   CHF (congestive heart failure) (HCC) Active Problems:   Acute respiratory failure with hypoxemia (HCC)   Pleural effusion on right  Acute Hypoxic Respiratory Failure Due to pneumonia, pleural effusion, and volume overload/pulmonary edema As noted below  Hospital Acquired Pneumonia Plan for cefepime x5 days Resp culture with abundant staph epi -> possible contaminant, will follow susceptibilities - seems to be improving on current regimen Blood cx NGTD x3  Transudative Pleural Effusion   1.9 L removed on 12/24 Cytology without malignant cells, cx  with Ngx3 Follow intermittent CXR  Volume Overload  HFmrEF with Exacerbation  Pulmonary Hypertension Echo 04/2023 with EF 40-45% Overloaded on exam with edema Hold lasix with bump in creatinine today Strict I/O, daily weights  AKI on CKD IV Creatinine bumped to 3.17 today Hold diuretics for today Will monitor   Coronary Artery Disease Aspirin, statin   Anemia of Chronic Disease Trend  Type 2 Diabetes SSI   Peripheral Neuropathy Doesn't appear to be on any pain meds for neuropathic pain  Suspected OSA Needs outpatient sleep study     DVT prophylaxis: heparin Code Status: full Family Communication: none Disposition:   Status is: Inpatient Remains inpatient appropriate because: need for continued inpatient care   Consultants:  none  Procedures:  ABI Summary:  Right: Resting right ankle-brachial index is within normal range.   Left: Resting left ankle-brachial index indicates moderate left lower  extremity arterial disease. The left toe-brachial index is normal.   Echo IMPRESSIONS     1. Left ventricular ejection fraction, by estimation, is 40 to 45%. The  left ventricle has mildly decreased function. The left ventricle has no  regional wall motion abnormalities. There is mild concentric left  ventricular hypertrophy. Left ventricular  diastolic parameters are indeterminate.   2. Right ventricular systolic function is moderately reduced. The right  ventricular size is mildly enlarged. There is moderately elevated  pulmonary artery systolic pressure. The estimated right ventricular  systolic pressure is 51.0 mmHg.   3. Left atrial size was mild to moderately dilated.   4. Right atrial size was moderately dilated.   5. The mitral valve is  normal in structure. No evidence of mitral valve  regurgitation. No evidence of mitral stenosis.   6. Tricuspid valve regurgitation is mild to moderate.   7. The aortic valve is tricuspid. There is mild calcification  of the  aortic valve. Aortic valve regurgitation is not visualized. No aortic  stenosis is present.   8. Aortic dilatation noted. There is borderline dilatation of the  ascending aorta, measuring 38 mm.   9. The inferior vena cava is dilated in size with <50% respiratory  variability, suggesting right atrial pressure of 15 mmHg.   Antimicrobials:  Anti-infectives (From admission, onward)    Start     Dose/Rate Route Frequency Ordered Stop   05/12/23 1000  ceFEPIme (MAXIPIME) 2 g in sodium chloride 0.9 % 100 mL IVPB        2 g 200 mL/hr over 30 Minutes Intravenous Every 24 hours 05/12/23 0910 05/17/23 0959   05/12/23 0115  Ampicillin-Sulbactam (UNASYN) 3 g in sodium chloride 0.9 % 100 mL IVPB        3 g 200 mL/hr over 30 Minutes Intravenous  Once 05/12/23 0111 05/12/23 0238       Subjective: Asking for something to help him sleep  Objective: Vitals:   05/15/23 0027 05/15/23 0705 05/15/23 0822 05/15/23 1133  BP: (!) 105/59  (!) 140/68 (!) 130/93  Pulse: 77  74 75  Resp: 16  14 20   Temp: (!) 97.3 F (36.3 C)  99 F (37.2 C) 98.9 F (37.2 C)  TempSrc: Oral  Oral Oral  SpO2: 92%  92% 92%  Weight:  98.3 kg    Height:        Intake/Output Summary (Last 24 hours) at 05/15/2023 1429 Last data filed at 05/15/2023 0845 Gross per 24 hour  Intake 240 ml  Output 2 ml  Net 238 ml   Filed Weights   05/13/23 0500 05/14/23 0500 05/15/23 0705  Weight: 100.1 kg 98.3 kg 98.3 kg    Examination:  General exam: chronically ill appearing Respiratory system: unlabored Cardiovascular system: RRR Gastrointestinal system: Abdomen is nondistended, soft and nontender.  Central nervous system: Alert and oriented. No focal neurological deficits. Extremities: bilateral LE edema   Data Reviewed: I have personally reviewed following labs and imaging studies  CBC: Recent Labs  Lab 05/11/23 2310 05/12/23 0043 05/12/23 0419 05/13/23 0236 05/13/23 0839 05/14/23 0303 05/15/23 0820   WBC 13.1*  --  13.8* 11.2*  --  10.5 10.6*  NEUTROABS 11.1*  --   --   --   --   --   --   HGB 9.2*   < > 9.0* 7.7* 8.0* 7.0* 7.1*  HCT 30.8*   < > 29.0* 24.4* 25.3* 22.7* 22.5*  MCV 113.2*  --  109.8* 106.6*  --  109.7* 109.8*  PLT 313  --  276 198  --  214 248   < > = values in this interval not displayed.    Basic Metabolic Panel: Recent Labs  Lab 05/11/23 0223 05/11/23 2310 05/12/23 0043 05/12/23 0354 05/12/23 0419 05/13/23 0236 05/14/23 0303 05/15/23 0820  NA 135 133*   < > 138 137 138 139 140  K 4.9 4.8   < > 4.8 4.9 4.5 4.6 4.4  CL 106 105  --   --  106 106 108 108  CO2 23 19*  --   --  19* 21* 20* 20*  GLUCOSE 182* 194*  --   --  207* 119* 98 119*  BUN  52* 54*  --   --  54* 57* 57* 62*  CREATININE 2.87* 2.85*  --   --  2.88* 2.58* 2.61* 3.17*  CALCIUM 8.5* 8.9  --   --  8.9 8.5* 8.6* 8.6*  MG 2.6*  --   --   --  2.7* 2.5* 2.6* 2.8*  PHOS  --   --   --   --  5.8*  --   --   --    < > = values in this interval not displayed.    GFR: Estimated Creatinine Clearance: 23.7 mL/min (Salimatou Simone) (by C-G formula based on SCr of 3.17 mg/dL (H)).  Liver Function Tests: Recent Labs  Lab 05/11/23 2310 05/12/23 1214  AST 26  --   ALT 25  --   ALKPHOS 205*  --   BILITOT 0.5  --   PROT 8.3* 6.8  ALBUMIN 3.0*  --     CBG: Recent Labs  Lab 05/14/23 1125 05/14/23 1557 05/14/23 2130 05/15/23 0818 05/15/23 1130  GLUCAP 162* 206* 113* 112* 175*     Recent Results (from the past 240 hours)  Resp panel by RT-PCR (RSV, Flu Lindsay Soulliere&B, Covid) Anterior Nasal Swab     Status: None   Collection Time: 05/12/23 12:21 AM   Specimen: Anterior Nasal Swab  Result Value Ref Range Status   SARS Coronavirus 2 by RT PCR NEGATIVE NEGATIVE Final   Influenza Siddalee Vanderheiden by PCR NEGATIVE NEGATIVE Final   Influenza B by PCR NEGATIVE NEGATIVE Final    Comment: (NOTE) The Xpert Xpress SARS-CoV-2/FLU/RSV plus assay is intended as an aid in the diagnosis of influenza from Nasopharyngeal swab specimens  and should not be used as Jedd Schulenburg sole basis for treatment. Nasal washings and aspirates are unacceptable for Xpert Xpress SARS-CoV-2/FLU/RSV testing.  Fact Sheet for Patients: BloggerCourse.com  Fact Sheet for Healthcare Providers: SeriousBroker.it  This test is not yet approved or cleared by the [place] FDA and has been authorized for detection and/or diagnosis of SARS-CoV-2 by FDA under an Emergency Use Authorization (EUA). This EUA will remain in effect (meaning this test can be used) for the duration of the COVID-19 declaration under Section 564(b)(1) of the Act, 21 U.S.C. section 360bbb-3(b)(1), unless the authorization is terminated or revoked.     Resp Syncytial Virus by PCR NEGATIVE NEGATIVE Final    Comment: (NOTE) Fact Sheet for Patients: BloggerCourse.com  Fact Sheet for Healthcare Providers: SeriousBroker.it  This test is not yet approved or cleared by the [place] FDA and has been authorized for detection and/or diagnosis of SARS-CoV-2 by FDA under an Emergency Use Authorization (EUA). This EUA will remain in effect (meaning this test can be used) for the duration of the COVID-19 declaration under Section 564(b)(1) of the Act, 21 U.S.C. section 360bbb-3(b)(1), unless the authorization is terminated or revoked.  Performed at [facility], [address], [place], [place] [address]   Blood Culture (routine x 2)     Status: None (Preliminary result)   Collection Time: 05/12/23 12:34 AM   Specimen: BLOOD RIGHT ARM  Result Value Ref Range Status   Specimen Description BLOOD RIGHT ARM  Final   Special Requests   Final    BOTTLES DRAWN AEROBIC AND ANAEROBIC Blood Culture adequate volume   Culture   Final    NO GROWTH 3 DAYS Performed at [facility], [address] [place], [place] [address]    Report Status PENDING  Incomplete  Blood Culture  (routine  x 2)     Status: None (Preliminary result)   Collection Time: 05/12/23 12:45 AM   Specimen: BLOOD RIGHT HAND  Result Value Ref Range Status   Specimen Description BLOOD RIGHT HAND  Final   Special Requests   Final    BOTTLES DRAWN AEROBIC AND ANAEROBIC Blood Culture adequate volume   Culture   Final    NO GROWTH 3 DAYS Performed at [facility], [address], [place], [place] [address]    Report Status PENDING  Incomplete  MRSA Next Gen by PCR, Nasal     Status: None   Collection Time: 05/12/23  3:45 AM   Specimen: Nasal Mucosa; Nasal Swab  Result Value Ref Range Status   MRSA by PCR Next Gen NOT DETECTED NOT DETECTED Final    Comment: (NOTE) The GeneXpert MRSA Assay (FDA approved for NASAL specimens only), is one component of Arretta Toenjes comprehensive MRSA colonization surveillance program. It is not intended to diagnose MRSA infection nor to guide or monitor treatment for MRSA infections. Test performance is not FDA approved in patients less than 110 years old. Performed at [facility], [name], [place], [place] [address]   Culture, Respiratory w Gram Stain     Status: None (Preliminary result)   Collection Time: 05/12/23  9:10 AM   Specimen: Tracheal Aspirate; Respiratory  Result Value Ref Range Status   Specimen Description TRACHEAL ASPIRATE  Final   Special Requests NONE  Final   Gram Stain   Final    NO WBC SEEN FEW GRAM POSITIVE COCCI IN CLUSTERS RARE GRAM POSITIVE RODS    Culture   Final    ABUNDANT STAPHYLOCOCCUS EPIDERMIDIS SUSCEPTIBILITIES TO FOLLOW Performed at [facility], [address] [place], [place] [address]    Report Status PENDING  Incomplete  Body fluid culture w Gram Stain     Status: None   Collection Time: 05/12/23 10:49 AM   Specimen: Pleural Fluid  Result Value Ref Range Status   Specimen Description PLEURAL  Final   Special Requests NONE  Final   Gram Stain NO WBC SEEN NO ORGANISMS SEEN   Final   Culture    Final    NO GROWTH 3 DAYS Performed at [facility], [address] [place], [place] [address]    Report Status 05/15/2023 FINAL  Final         Radiology Studies: No results found.      Scheduled Meds:  aspirin EC  81 mg Oral Daily   atorvastatin  40 mg Oral Daily   Chlorhexidine Gluconate Cloth  6 each Topical Daily   ferrous sulfate  325 mg Oral Q breakfast   Gerhardt's butt cream   Topical BID   heparin  5,000 Units Subcutaneous Q8H   hydrocerin   Topical BID   insulin aspart  0-15 Units Subcutaneous TID WC   insulin aspart  0-5 Units Subcutaneous QHS   isosorbide-hydrALAZINE  2 tablet Oral TID   leptospermum manuka honey  1 Application Topical Daily   traZODone  100 mg Oral QHS   Continuous Infusions:  ceFEPime (MAXIPIME) IV 2 g (05/15/23 1002)     LOS: 3 days    Time spent: over 30 min     Lacretia Nicks, MD [facility]   To contact the attending provider between 7A-7P or the covering provider during after hours 7P-7A, please log into the web site [email or web address] and access using universal [place]  password for that web site. If you do not have the password, please call the hospital operator.  05/15/2023, 2:29 PM

## 2023-05-15 NOTE — Progress Notes (Signed)
Patient refusing to have his vital signs completed. He stated "I won't let you do anything until I get my Xanax". Educated patient on the reasons the doctor did not order his Xanax. Pt still refuses vitals at this time.

## 2023-05-15 NOTE — Progress Notes (Signed)
Patient moaning in pain. Offered pain medication. Patient refusing stating "I'll just deal with it". Told pt to let me know if he changed his mind.

## 2023-05-15 NOTE — NC FL2 (Signed)
[place] MEDICAID FL2 LEVEL OF CARE FORM     IDENTIFICATION  Patient Name: David [name] Birthdate: 18-Jan-1946 Sex: male Admission Date (Current Location): 05/11/2023  [facility] and [place] Number:  Producer, television/film/video and Address:  [facility] [place], [address] [place], [place] [address]      Provider Number: [id number]  Attending Physician Name and Address:  Zigmund Daniel., *  Relative Name and Phone Number:       Current Level of Care: Hospital Recommended Level of Care: Skilled Nursing Facility Prior Approval Number:    Date Approved/Denied:   PASRR Number: [id number]  Discharge Plan: SNF    Current Diagnoses: Patient Active Problem List   Diagnosis Date Noted   CHF (congestive heart failure) (HCC) 05/12/2023   Malnutrition of moderate degree 05/06/2023   Diabetic foot infection (HCC) 05/05/2023   Sacral wound 05/05/2023   Pleural effusion on right 05/05/2023   Elevated TSH 05/05/2023   Acute on chronic diastolic (congestive) heart failure (HCC) 05/05/2023   Diastolic CHF (HCC) 05/04/2023   Class 1 obesity 06/30/2022   Acute on chronic diastolic CHF (congestive heart failure) (HCC) 06/29/2022   PAD (peripheral artery disease) (HCC) 06/13/2022   Critical limb ischemia of left lower extremity (HCC) 03/21/2021   Nonhealing ulcer of left lower extremity limited to breakdown of skin (HCC) 03/21/2021   Abscess of left foot 08/03/2020   Osteomyelitis of fifth toe of left foot (HCC)    Mixed hyperlipidemia 10/15/2018   Bilateral leg edema 09/17/2018   Stage 3b chronic kidney disease (HCC) 09/10/2018   Anemia 09/10/2018   Pain and swelling of wrist, right 08/14/2018   Bilateral carotid artery stenosis 07/30/2018   Coronary artery disease involving native coronary artery of native heart without angina pectoris 07/30/2018   Snoring 02/11/2018   Chronic heart failure with preserved ejection fraction (HFpEF) (HCC) 12/22/2017   At  risk for adverse drug reaction 12/15/2017   Chronic pain syndrome 12/15/2017   Status post coronary artery stent placement    Multifocal pneumonia 03/30/2017   Type 2 diabetes mellitus with hyperlipidemia (HCC) 03/29/2017   Acute respiratory failure with hypoxemia (HCC) 03/29/2017   Acute on chronic respiratory failure with hypoxia (HCC)    Pulmonary congestion    Acquired contracture of Achilles tendon, right 08/19/2016   Amputated great toe, right (HCC) 07/18/2016   Onychomycosis 05/29/2016   Diabetic polyneuropathy associated with type 2 diabetes mellitus (HCC) 04/09/2016   Right foot ulcer, limited to breakdown of skin (HCC) 04/09/2016   Hereditary and idiopathic peripheral neuropathy 09/15/2013   Malaise 08/14/2013   Essential hypertension 08/14/2013   Chronic back pain 08/14/2013   Glaucoma 08/14/2013   Headache 08/14/2013    Orientation RESPIRATION BLADDER Height & Weight     Place, Situation, Time, Self  O2 (4L O2 [place]) Incontinent Weight: 216 lb 11.4 oz (98.3 kg) (patient refused) Height:  5\' 11"  (180.3 cm)  BEHAVIORAL SYMPTOMS/MOOD NEUROLOGICAL BOWEL NUTRITION STATUS      Incontinent Diet (see dc summary)  AMBULATORY STATUS COMMUNICATION OF NEEDS Skin   Extensive Assist Verbally PU Stage and Appropriate Care (Pressure Injury Heel Left Deep Tissue Pressure Injury; Pressure Injury Coccyx Mid Stage 2)                       Personal Care Assistance Level of Assistance  Bathing, Feeding, Dressing Bathing Assistance:  (see dc summary) Feeding assistance:  (see dc summary) Dressing Assistance:  (  see dc summary)     Functional Limitations Info  Hearing, Speech Sight Info: Impaired (Vision impairement both eyes) Hearing Info: Adequate Speech Info: Adequate    SPECIAL CARE FACTORS FREQUENCY  PT (By licensed PT), OT (By licensed OT)     PT Frequency: 5x week OT Frequency: 5x week            Contractures Contractures Info: Not present    Additional Factors  Info  Code Status Code Status Info: Full             Current Medications (05/15/2023):  This is the current hospital active medication list Current Facility-Administered Medications  Medication Dose Route Frequency Provider Last Rate Last Admin   acetaminophen (TYLENOL) tablet 650 mg  650 mg Oral Q6H PRN Quenton Fetter, RPH       aspirin EC tablet 81 mg  81 mg Oral Daily Quenton Fetter, RPH   81 mg at 05/15/23 0949   atorvastatin (LIPITOR) tablet 40 mg  40 mg Oral Daily Link Snuffer B, RPH   40 mg at 05/15/23 0949   ceFEPIme (MAXIPIME) 2 g in sodium chloride 0.9 % 100 mL IVPB  2 g Intravenous Q24H Charlott Holler, MD 200 mL/hr at 05/15/23 1002 2 g at 05/15/23 1002   Chlorhexidine Gluconate Cloth 2 % PADS 6 each  6 each Topical Daily Patrici Ranks, MD   6 each at 05/15/23 9147   docusate sodium (COLACE) capsule 100 mg  100 mg Oral Daily PRN Charlott Holler, MD       ferrous sulfate tablet 325 mg  325 mg Oral Q breakfast Quenton Fetter, RPH   325 mg at 05/15/23 8295   Gerhardt's butt cream   Topical BID Patrici Ranks, MD   Given at 05/15/23 6213   heparin injection 5,000 Units  5,000 Units Subcutaneous Q8H Patrici Ranks, MD   5,000 Units at 05/14/23 2152   hydrALAZINE (APRESOLINE) injection 10 mg  10 mg Intravenous Q4H PRN Pontiff Clam, NP       hydrocerin (EUCERIN) cream   Topical BID Patrici Ranks, MD   Given at 05/15/23 0865   insulin aspart (novoLOG) injection 0-15 Units  0-15 Units Subcutaneous TID WC Zigmund Daniel., MD       insulin aspart (novoLOG) injection 0-5 Units  0-5 Units Subcutaneous QHS Zigmund Daniel., MD       isosorbide-hydrALAZINE (BIDIL) 20-37.5 MG per tablet 2 tablet  2 tablet Oral TID Quenton Fetter, RPH   2 tablet at 05/15/23 0949   leptospermum manuka honey (MEDIHONEY) paste 1 Application  1 Application Topical Daily Patrici Ranks, MD   1 Application at 05/15/23 0951   oxyCODONE (Oxy IR/ROXICODONE) immediate release  tablet 5 mg  5 mg Oral Q6H PRN Link Snuffer B, RPH   5 mg at 05/15/23 0949   polyethylene glycol (MIRALAX / GLYCOLAX) packet 17 g  17 g Oral Daily PRN Charlott Holler, MD       traZODone (DESYREL) tablet 100 mg  100 mg Oral QHS Zigmund Daniel., MD         Discharge Medications: Please see discharge summary for a list of discharge medications.  Relevant Imaging Results:  Relevant Lab Results:   Additional Information SS# [id number] [place]

## 2023-05-15 NOTE — Progress Notes (Signed)
Patient refusing lab draw until after 0700.

## 2023-05-15 NOTE — Plan of Care (Signed)
Care plan reviewed.

## 2023-05-15 NOTE — Progress Notes (Signed)
Patient refusing AM vitals, blood sugars and dressing change at this time. Educated patient on importance of each. Patient still refusing at this time.

## 2023-05-16 DIAGNOSIS — I509 Heart failure, unspecified: Secondary | ICD-10-CM | POA: Diagnosis not present

## 2023-05-16 LAB — GLUCOSE, CAPILLARY
Glucose-Capillary: 131 mg/dL — ABNORMAL HIGH (ref 70–99)
Glucose-Capillary: 204 mg/dL — ABNORMAL HIGH (ref 70–99)
Glucose-Capillary: 141 mg/dL — ABNORMAL HIGH (ref 70–99)
Glucose-Capillary: 190 mg/dL — ABNORMAL HIGH (ref 70–99)

## 2023-05-16 LAB — COMPREHENSIVE METABOLIC PANEL
ALT: 18 U/L (ref 0–44)
AST: 22 U/L (ref 15–41)
Albumin: 2.3 g/dL — ABNORMAL LOW (ref 3.5–5.0)
Alkaline Phosphatase: 202 U/L — ABNORMAL HIGH (ref 38–126)
Anion gap: 10 (ref 5–15)
BUN: 59 mg/dL — ABNORMAL HIGH (ref 8–23)
CO2: 22 mmol/L (ref 22–32)
Calcium: 8.7 mg/dL — ABNORMAL LOW (ref 8.9–10.3)
Chloride: 108 mmol/L (ref 98–111)
Creatinine, Ser: 2.86 mg/dL — ABNORMAL HIGH (ref 0.61–1.24)
GFR, Estimated: 22 mL/min — ABNORMAL LOW (ref >60)
Glucose, Bld: 155 mg/dL — ABNORMAL HIGH (ref 70–99)
Potassium: 4.6 mmol/L (ref 3.5–5.1)
Sodium: 140 mmol/L (ref 135–145)
Total Bilirubin: 0.7 mg/dL (ref <1.2)
Total Protein: 6.9 g/dL (ref 6.5–8.1)

## 2023-05-16 LAB — CULTURE, RESPIRATORY W GRAM STAIN: Gram Stain: NONE SEEN

## 2023-05-16 LAB — CBC WITH DIFFERENTIAL/PLATELET
Abs Immature Granulocytes: 0 10*3/uL (ref 0.00–0.07)
Basophils Absolute: 0 10*3/uL (ref 0.0–0.1)
Basophils Relative: 0 %
Eosinophils Absolute: 0.2 10*3/uL (ref 0.0–0.5)
Eosinophils Relative: 2 %
HCT: 22.6 % — ABNORMAL LOW (ref 39.0–52.0)
Hemoglobin: 7.1 g/dL — ABNORMAL LOW (ref 13.0–17.0)
Lymphocytes Relative: 6 %
Lymphs Abs: 0.5 10*3/uL — ABNORMAL LOW (ref 0.7–4.0)
MCH: 34.6 pg — ABNORMAL HIGH (ref 26.0–34.0)
MCHC: 31.4 g/dL (ref 30.0–36.0)
MCV: 110.2 fL — ABNORMAL HIGH (ref 80.0–100.0)
Monocytes Absolute: 0.4 10*3/uL (ref 0.1–1.0)
Monocytes Relative: 4 %
Neutro Abs: 7.8 10*3/uL — ABNORMAL HIGH (ref 1.7–7.7)
Neutrophils Relative %: 88 %
Platelets: 256 10*3/uL (ref 150–400)
RBC: 2.05 MIL/uL — ABNORMAL LOW (ref 4.22–5.81)
RDW: 14 % (ref 11.5–15.5)
WBC: 8.9 10*3/uL (ref 4.0–10.5)
nRBC: 0 % (ref 0.0–0.2)
nRBC: 0 /100{WBCs}

## 2023-05-16 LAB — PHOSPHORUS: Phosphorus: 4.4 mg/dL (ref 2.5–4.6)

## 2023-05-16 LAB — MAGNESIUM: Magnesium: 3 mg/dL — ABNORMAL HIGH (ref 1.7–2.4)

## 2023-05-16 NOTE — Progress Notes (Signed)
Patient not using BIPAP at night. No unit in room at this time.

## 2023-05-16 NOTE — Evaluation (Signed)
Occupational Therapy Evaluation Patient Details Name: David [name] MRN: [id number] DOB: 20-Apr-1946 Today's Date: 05/16/2023   History of Present Illness 77 y.o. male presented 05/11/23 with Acute hypoxemic respiratory failure,  Acute pulmonary edema,  HAP,  Right pleural effusion s/p thoracentesis.  PMHx of CAD (PCI), HTN, dyslipidemia, chronic systolic/diastolic CHF, pulmonary hypertension, anemia of chronic illness, DM-2, neuropathy, CKD-4, and possible OSA.   Clinical Impression   Pt admitted for above, he presents as generally weak in his BLEs/BUEs and needs significant assist for bed mobility and +2 assist for transfers. Based on reported baseline pt demonstrating poor balance needing min A + UE support to  maintain a static sitting position. Pt would benefit from continued acute skilled OT services to address listed deficits and help transition to next level of care. Patient would benefit from post acute skilled rehab facility with <3 hours of therapy and 24/7 support        If plan is discharge home, recommend the following: Two people to help with walking and/or transfers;Two people to help with bathing/dressing/bathroom;Assistance with cooking/housework;Help with stairs or ramp for entrance;Assist for transportation    Functional Status Assessment  Patient has had a recent decline in their functional status and demonstrates the ability to make significant improvements in function in a reasonable and predictable amount of time.  Equipment Recommendations  Other (comment) (TBD at next level of care)    Recommendations for Other Services       Precautions / Restrictions Precautions Precautions: Fall Restrictions Weight Bearing Restrictions Per Provider Order: No      Mobility Bed Mobility Overal bed mobility: Needs Assistance Bed Mobility: Rolling, Sidelying to Sit Rolling: Min assist, Used rails Sidelying to sit: Mod assist, Used rails, HOB elevated       General  bed mobility comments: cues to sequence roll and use of rails for bed mobility    Transfers Overall transfer level: Needs assistance   Transfers: Bed to chair/wheelchair/BSC Sit to Stand: Max assist, +2 physical assistance, From elevated surface, +2 safety/equipment           General transfer comment: pt with poor sitting balance, deferred use of slide board due to his likelihood of falling      Balance Overall balance assessment: Needs assistance Sitting-balance support: Single extremity supported, Feet supported Sitting balance-Leahy Scale: Poor Sitting balance - Comments: Pt needing UE support on bed rails to maintain static sitting balance with occasional min A     Standing balance-Leahy Scale: Poor Standing balance comment: Reliant on therapists                           ADL either performed or assessed with clinical judgement   ADL Overall ADL's : Needs assistance/impaired Eating/Feeding: Set up;Sitting   Grooming: Sitting;Minimal assistance Grooming Details (indicate cue type and reason): min A to maintain sitting balance to complete ADLs Upper Body Bathing: Sitting;Minimal assistance Upper Body Bathing Details (indicate cue type and reason): min A to maintain sitting balance to complete ADLs Lower Body Bathing: Maximal assistance;Sitting/lateral leans   Upper Body Dressing : Bed level;Set up   Lower Body Dressing: Total assistance;Bed level Lower Body Dressing Details (indicate cue type and reason): don socks Toilet Transfer: Maximal assistance;+2 for physical assistance;Stand-pivot Toilet Transfer Details (indicate cue type and reason): simulated with drop-arm recliner Toileting- Clothing Manipulation and Hygiene: Maximal assistance               Vision Ability  to See in Adequate Light: 2 Moderately impaired Vision Assessment?: Vision impaired- to be further tested in functional context Additional Comments: 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,  impaired vision to R side     Perception         Praxis         Pertinent Vitals/Pain Pain Assessment Pain Assessment: Faces Faces Pain Scale: Hurts little more Pain Location: BLEs with movement Pain Descriptors / Indicators: Aching, Discomfort, Grimacing Pain Intervention(s): Limited activity within patient's tolerance, Monitored during session, Repositioned     Extremity/Trunk Assessment Upper Extremity Assessment Upper Extremity Assessment: Generalized weakness RUE Deficits / Details: Good grip strength, limited shoulder AROM that is worse than L but acheives 70 degrees RUE Sensation: history of peripheral neuropathy LUE Deficits / Details: Good grip strength, limited shoulder AROM but able to get 100 degrees LUE Sensation: history of peripheral neuropathy           Communication Communication Communication: No apparent difficulties   Cognition Arousal: Alert Behavior During Therapy: WFL for tasks assessed/performed Overall Cognitive Status: Within Functional Limits for tasks assessed                                       General Comments  VSS on 5L, HR in the 70s with activity, but was low 80s at rest. Pt left in the chair, upon check in he was still in chair with vitals stable, mentioned to pt that PT would be able to get him back in bed if he desired    Exercises General Exercises - Lower Extremity Long Arc Quad: Both, 10 reps, Seated, AROM Hip Flexion/Marching: AROM, Seated, Both, 10 reps   Shoulder Instructions      Home Living Family/patient expects to be discharged to:: Skilled nursing facility Living Arrangements: Spouse/significant other Available Help at Discharge: Family;Available PRN/intermittently Type of Home: House Home Access: Stairs to enter [employer] of Steps: 3 (pt reports he goes up on his bottom) Entrance Stairs-Rails: Right;Left Home Layout: Two level;Able to live on main level with  bedroom/bathroom Alternate Level Stairs-Number of Steps: full flight, typically backs up the stairs on his bottom Alternate Level Stairs-Rails: Right Bathroom Shower/Tub: Tub/shower unit   Bathroom Toilet: Standard Bathroom Accessibility: Yes   Home Equipment: Agricultural consultant (2 wheels);Cane - single point;Shower seat;Wheelchair - manual          Prior Functioning/Environment Prior Level of Function : Needs assist             Mobility Comments: Using quad cane in and out of the home. Has not mobilized much since February. Can only walk short distances and does not use RW because he has to go sideways in the hall. Did not stay at SNF long enough for PT, or he doesn't remember. ADLs Comments: per previous record--Spouse assist with community mobility, ADL, iADL, and medication management. Per pt today, wife assists with IADL        OT Problem List: Decreased strength;Decreased activity tolerance;Impaired balance (sitting and/or standing);Cardiopulmonary status limiting activity      OT Treatment/Interventions: Self-care/ADL training;DME and/or AE instruction;Therapeutic exercise;Balance training;Patient/family education;Therapeutic activities    OT Goals(Current goals can be found in the care plan section) Acute Rehab OT Goals Patient Stated Goal: To get better; not drop low (HR) OT Goal Formulation: With patient Time For Goal Achievement: 05/30/23 Potential to Achieve Goals: Good ADL Goals Pt Will Perform Grooming:  sitting;with set-up Pt Will Transfer to Toilet: with mod assist;bedside commode;stand pivot transfer;with +2 assist Pt/caregiver will Perform Home Exercise Program: Increased strength;Both right and left upper extremity;With theraband;With Supervision;With written HEP provided Additional ADL Goal #2: Pt will tolerate 3 mins sitting EOB without UE support in preparation to complete seated ADLs  OT Frequency: Min 1X/week    Co-evaluation              AM-PAC  OT "6 Clicks" Daily Activity     Outcome Measure Help from another person eating meals?: A Little Help from another person taking care of personal grooming?: A Little Help from another person toileting, which includes using toliet, bedpan, or urinal?: Total Help from another person bathing (including washing, rinsing, drying)?: A Lot Help from another person to put on and taking off regular upper body clothing?: A Little Help from another person to put on and taking off regular lower body clothing?: A Lot 6 Click Score: 14   End of Session Equipment Utilized During Treatment: Gait belt;Rolling walker (2 wheels);Oxygen Nurse Communication: Mobility status;Need for lift equipment (use stedy +2 people to transfer)  Activity Tolerance: Patient tolerated treatment well Patient left: in chair;with call bell/phone within reach;with chair alarm set;with family/visitor present  OT Visit Diagnosis: Unsteadiness on feet (R26.81);Other abnormalities of gait and mobility (R26.89);Muscle weakness (generalized) (M62.81);Other (comment) (SOB)                Time: 4782-9562 OT Time Calculation (min): 30 min Charges:  OT General Charges $OT Visit: 1 Visit OT Evaluation $OT Eval Moderate Complexity: 1 Mod OT Treatments $Therapeutic Activity: 8-22 mins  05/16/2023  AB, OTR/L  Acute Rehabilitation Services  Office: [phone]   Tristan Schroeder 05/16/2023, 1:05 PM

## 2023-05-16 NOTE — Progress Notes (Addendum)
Pt refused midnight CBG monitoring and pulse ox monitoring. Pt refusing to remove his gloves and will not allow [place] to place a pulse ox anywhere else. Pt education provided.

## 2023-05-16 NOTE — Progress Notes (Addendum)
PROGRESS NOTE    David [name]  NWG:[id number] DOB: [birth year] DOA: 05/11/2023 PCP: Renford Dills, MD  Chief Complaint  Patient presents with   Respiratory Distress    Brief Narrative:   David [name] 77 y.o. male patient with David [name] PMHx of CAD (PCI), HTN, dyslipidemia, chronic systolic/diastolic CHF (May 05, 2023: Echo EF 40-45%), pulmonary hypertension (51 mmHg), anemia of chronic illness, DM-2, neuropathy, CKD-4, and possible OSA who was discharged yesterday for CHF exacerbation, acute hypoxic resp failure, and PNA, who presents from NH after being there for 4 hrs with acute respiratory distress. EMS gave some albuterol, and he did not improve his respiratory status, when EMS arrived, he was saturating in the 60s, and they put him on CPAP and he only improved to the 70s. He failed NRM. They gave him 2 sublingual nitroglycerin for HTN. He was hypothermic to 35.8 F, mildly tachycardic, and hypertensive to 140s 150s with tachypnea accessory muscle use and respiratory failure despite CPAP ventilation. He became lethargic, so he was intubated. Wife at the bedside denied choking but she noticed that he has apneas and last sleep eval was 2019. He quit smoking many yrs ago and he smoked few cigarettes daily.   Significant Events 12/24Given Lasix 80 mg IV in ED 12/24 Intubated on Propofol  12/24 thoracentesis  Assessment & Plan:   Principal Problem:   CHF (congestive heart failure) (HCC) Active Problems:   Acute respiratory failure with hypoxemia (HCC)   Pleural effusion on right  Acute Hypoxic Respiratory Failure Due to pneumonia, pleural effusion, and volume overload/pulmonary edema As noted below  Hospital Acquired Pneumonia Plan for cefepime x5 days Resp culture with abundant staph epi (oxacillin and tetracycline resistant) -> possible contaminant, will follow susceptibilities - seems to be improving on current regimen (will discuss with pulm -> recommended CXR and procal and consider treating  staph epi based on this) Blood cx NGTD x3  Transudative Pleural Effusion   1.9 L removed on 12/24 Cytology without malignant cells, cx with Ngx3 Follow intermittent CXR  Volume Overload  HFmrEF with Exacerbation  Pulmonary Hypertension Echo 04/2023 with EF 40-45% Overloaded on exam with edema Hold lasix with bump in creatinine today Strict I/O, daily weights (net negative 2 L)  AKI on CKD IV Creatinine downtrending Hold diuretics for today Will monitor   Coronary Artery Disease Aspirin, statin   Anemia of Chronic Disease Trend  Type 2 Diabetes SSI   Peripheral Neuropathy Doesn't appear to be on any pain meds for neuropathic pain  Suspected OSA Needs outpatient sleep study    DVT prophylaxis: heparin Code Status: full Family Communication: none Disposition:   Status is: Inpatient Remains inpatient appropriate because: need for continued inpatient care   Consultants:  none  Procedures:  ABI Summary:  Right: Resting right ankle-brachial index is within normal range.   Left: Resting left ankle-brachial index indicates moderate left lower  extremity arterial disease. The left toe-brachial index is normal.   Echo IMPRESSIONS     1. Left ventricular ejection fraction, by estimation, is 40 to 45%. The  left ventricle has mildly decreased function. The left ventricle has no  regional wall motion abnormalities. There is mild concentric left  ventricular hypertrophy. Left ventricular  diastolic parameters are indeterminate.   2. Right ventricular systolic function is moderately reduced. The right  ventricular size is mildly enlarged. There is moderately elevated  pulmonary artery systolic pressure. The estimated right ventricular  systolic pressure is 51.0 mmHg.   3. Left atrial size  was mild to moderately dilated.   4. Right atrial size was moderately dilated.   5. The mitral valve is normal in structure. No evidence of mitral valve  regurgitation. No  evidence of mitral stenosis.   6. Tricuspid valve regurgitation is mild to moderate.   7. The aortic valve is tricuspid. There is mild calcification of the  aortic valve. Aortic valve regurgitation is not visualized. No aortic  stenosis is present.   8. Aortic dilatation noted. There is borderline dilatation of the  ascending aorta, measuring 38 mm.   9. The inferior vena cava is dilated in size with <50% respiratory  variability, suggesting right atrial pressure of 15 mmHg.   Antimicrobials:  Anti-infectives (From admission, onward)    Start     Dose/Rate Route Frequency Ordered Stop   05/12/23 1000  ceFEPIme (MAXIPIME) 2 g in sodium chloride 0.9 % 100 mL IVPB        2 g 200 mL/hr over 30 Minutes Intravenous Every 24 hours 05/12/23 0910 05/16/23 0912   05/12/23 0115  Ampicillin-Sulbactam (UNASYN) 3 g in sodium chloride 0.9 % 100 mL IVPB        3 g 200 mL/hr over 30 Minutes Intravenous  Once 05/12/23 0111 05/12/23 0238       Subjective: C/o blood draws Asking for help with his nasal cannula  Objective: Vitals:   05/16/23 1118 05/16/23 1400 05/16/23 1500 05/16/23 1552  BP: (!) 110/94   (!) 140/70  Pulse: 79   71  Resp: 14   20  Temp: 98.5 F (36.9 C)   97.8 F (36.6 C)  TempSrc: Oral   Oral  SpO2: 91% 98% 99% 96%  Weight:      Height:        Intake/Output Summary (Last 24 hours) at 05/16/2023 1656 Last data filed at 05/16/2023 1558 Gross per 24 hour  Intake 757 ml  Output 2150 ml  Net -1393 ml   Filed Weights   05/14/23 0500 05/15/23 0705 05/16/23 0405  Weight: 98.3 kg 98.3 kg 102.7 kg    Examination:  General: No acute distress. Cardiovascular: RRR Lungs: unlabored Neurological: Alert and oriented 3. Moves all extremities 4 with equal strength. Cranial nerves II through XII grossly intact. Extremities: trace edema  Data Reviewed: I have personally reviewed following labs and imaging studies  CBC: Recent Labs  Lab 05/11/23 2310 05/12/23 0043  05/12/23 0419 05/13/23 0236 05/13/23 0839 05/14/23 0303 05/15/23 0820 05/16/23 0229  WBC 13.1*  --  13.8* 11.2*  --  10.5 10.6* 8.9  NEUTROABS 11.1*  --   --   --   --   --   --  7.8*  HGB 9.2*   < > 9.0* 7.7* 8.0* 7.0* 7.1* 7.1*  HCT 30.8*   < > 29.0* 24.4* 25.3* 22.7* 22.5* 22.6*  MCV 113.2*  --  109.8* 106.6*  --  109.7* 109.8* 110.2*  PLT 313  --  276 198  --  214 248 256   < > = values in this interval not displayed.    Basic Metabolic Panel: Recent Labs  Lab 05/12/23 0419 05/13/23 0236 05/14/23 0303 05/15/23 0820 05/16/23 0229  NA 137 138 139 140 140  K 4.9 4.5 4.6 4.4 4.6  CL 106 106 108 108 108  CO2 19* 21* 20* 20* 22  GLUCOSE 207* 119* 98 119* 155*  BUN 54* 57* 57* 62* 59*  CREATININE 2.88* 2.58* 2.61* 3.17* 2.86*  CALCIUM 8.9 8.5* 8.6* 8.6*  8.7*  MG 2.7* 2.5* 2.6* 2.8* 3.0*  PHOS 5.8*  --   --   --  4.4    GFR: Estimated Creatinine Clearance: 26.8 mL/min (Catrina Fellenz) (by C-G formula based on SCr of 2.86 mg/dL (H)).  Liver Function Tests: Recent Labs  Lab 05/11/23 2310 05/12/23 1214 05/16/23 0229  AST 26  --  22  ALT 25  --  18  ALKPHOS 205*  --  202*  BILITOT 0.5  --  0.7  PROT 8.3* 6.8 6.9  ALBUMIN 3.0*  --  2.3*    CBG: Recent Labs  Lab 05/15/23 1542 05/15/23 2052 05/16/23 0739 05/16/23 1117 05/16/23 1555  GLUCAP 148* 153* 131* 204* 141*     Recent Results (from the past 240 hours)  Resp panel by RT-PCR (RSV, Flu Moreen Piggott&B, Covid) Anterior Nasal Swab     Status: None   Collection Time: 05/12/23 12:21 AM   Specimen: Anterior Nasal Swab  Result Value Ref Range Status   SARS Coronavirus 2 by RT PCR NEGATIVE NEGATIVE Final   Influenza Torryn Fiske by PCR NEGATIVE NEGATIVE Final   Influenza B by PCR NEGATIVE NEGATIVE Final    Comment: (NOTE) The Xpert Xpress SARS-CoV-2/FLU/RSV plus assay is intended as an aid in the diagnosis of influenza from Nasopharyngeal swab specimens and should not be used as Brit Wernette sole basis for treatment. Nasal washings and aspirates are  unacceptable for Xpert Xpress SARS-CoV-2/FLU/RSV testing.  Fact Sheet for Patients: BloggerCourse.com  Fact Sheet for Healthcare Providers: SeriousBroker.it  This test is not yet approved or cleared by the [place] FDA and has been authorized for detection and/or diagnosis of SARS-CoV-2 by FDA under an Emergency Use Authorization (EUA). This EUA will remain in effect (meaning this test can be used) for the duration of the COVID-19 declaration under Section 564(b)(1) of the Act, 21 U.S.C. section 360bbb-3(b)(1), unless the authorization is terminated or revoked.     Resp Syncytial Virus by PCR NEGATIVE NEGATIVE Final    Comment: (NOTE) Fact Sheet for Patients: BloggerCourse.com  Fact Sheet for Healthcare Providers: SeriousBroker.it  This test is not yet approved or cleared by the [place] FDA and has been authorized for detection and/or diagnosis of SARS-CoV-2 by FDA under an Emergency Use Authorization (EUA). This EUA will remain in effect (meaning this test can be used) for the duration of the COVID-19 declaration under Section 564(b)(1) of the Act, 21 U.S.C. section 360bbb-3(b)(1), unless the authorization is terminated or revoked.  Performed at [facility], [address], [place], [place] [address]   Blood Culture (routine x 2)     Status: None (Preliminary result)   Collection Time: 05/12/23 12:34 AM   Specimen: BLOOD RIGHT ARM  Result Value Ref Range Status   Specimen Description BLOOD RIGHT ARM  Final   Special Requests   Final    BOTTLES DRAWN AEROBIC AND ANAEROBIC Blood Culture adequate volume   Culture   Final    NO GROWTH 4 DAYS Performed at [facility], [address] [place], [place] [address]    Report Status PENDING  Incomplete  Blood Culture (routine x 2)     Status: None (Preliminary result)   Collection Time: 05/12/23 12:45 AM    Specimen: BLOOD RIGHT HAND  Result Value Ref Range Status   Specimen Description BLOOD RIGHT HAND  Final   Special Requests   Final    BOTTLES DRAWN AEROBIC AND ANAEROBIC Blood Culture adequate volume   Culture  Final    NO GROWTH 4 DAYS Performed at [facility], [address] [place], [place] [address]    Report Status PENDING  Incomplete  MRSA Next Gen by PCR, Nasal     Status: None   Collection Time: 05/12/23  3:45 AM   Specimen: Nasal Mucosa; Nasal Swab  Result Value Ref Range Status   MRSA by PCR Next Gen NOT DETECTED NOT DETECTED Final    Comment: (NOTE) The GeneXpert MRSA Assay (FDA approved for NASAL specimens only), is one component of Keren Alverio comprehensive MRSA colonization surveillance program. It is not intended to diagnose MRSA infection nor to guide or monitor treatment for MRSA infections. Test performance is not FDA approved in patients less than 39 years old. Performed at [facility], [address], [place], [place] [address]   Culture, Respiratory w Gram Stain     Status: None   Collection Time: 05/12/23  9:10 AM   Specimen: Tracheal Aspirate; Respiratory  Result Value Ref Range Status   Specimen Description TRACHEAL ASPIRATE  Final   Special Requests NONE  Final   Gram Stain   Final    NO WBC SEEN FEW GRAM POSITIVE COCCI IN CLUSTERS RARE GRAM POSITIVE RODS Performed at [facility], [address] [place], [place] [address]    Culture ABUNDANT STAPHYLOCOCCUS EPIDERMIDIS  Final   Report Status 05/16/2023 FINAL  Final   Organism ID, Bacteria STAPHYLOCOCCUS EPIDERMIDIS  Final      Susceptibility   Staphylococcus epidermidis - MIC*    CIPROFLOXACIN <=0.5 SENSITIVE Sensitive     ERYTHROMYCIN <=0.25 SENSITIVE Sensitive     GENTAMICIN <=0.5 SENSITIVE Sensitive     OXACILLIN >=4 RESISTANT Resistant     TETRACYCLINE >=16 RESISTANT Resistant     VANCOMYCIN 1 SENSITIVE Sensitive     TRIMETH/SULFA <=10 SENSITIVE Sensitive     CLINDAMYCIN  <=0.25 SENSITIVE Sensitive     RIFAMPIN <=0.5 SENSITIVE Sensitive     Inducible Clindamycin NEGATIVE Sensitive     * ABUNDANT STAPHYLOCOCCUS EPIDERMIDIS  Body fluid culture w Gram Stain     Status: None   Collection Time: 05/12/23 10:49 AM   Specimen: Pleural Fluid  Result Value Ref Range Status   Specimen Description PLEURAL  Final   Special Requests NONE  Final   Gram Stain NO WBC SEEN NO ORGANISMS SEEN   Final   Culture   Final    NO GROWTH 3 DAYS Performed at [facility], [address] [place], [place] [address]    Report Status 05/15/2023 FINAL  Final         Radiology Studies: No results found.      Scheduled Meds:  aspirin EC  81 mg Oral Daily   atorvastatin  40 mg Oral Daily   Chlorhexidine Gluconate Cloth  6 each Topical Daily   ferrous sulfate  325 mg Oral Q breakfast   Gerhardt's butt cream   Topical BID   heparin  5,000 Units Subcutaneous Q8H   hydrocerin   Topical BID   insulin aspart  0-15 Units Subcutaneous TID WC   insulin aspart  0-5 Units Subcutaneous QHS   isosorbide-hydrALAZINE  2 tablet Oral TID   leptospermum manuka honey  1 Application Topical Daily   traZODone  100 mg Oral QHS   Continuous Infusions:     LOS: 4 days    Time spent: over 30 min     Lacretia Nicks, MD [facility]  To contact the attending provider between 7A-7P or the covering provider during after hours 7P-7A, please log into the web site [email or web address] and access using universal [place] password for that web site. If you do not have the password, please call the hospital operator.  05/16/2023, 4:56 PM

## 2023-05-16 NOTE — Progress Notes (Signed)
Pt refused 4 am CBG monitoring due to not wanting to remove his gloves. He expressed frustration regarding morning labs as well but agreed to have them drawn. Pt did allow me to perform his wound care this morning.

## 2023-05-17 ENCOUNTER — Inpatient Hospital Stay (HOSPITAL_COMMUNITY): Payer: Medicare Other

## 2023-05-17 DIAGNOSIS — I5043 Acute on chronic combined systolic (congestive) and diastolic (congestive) heart failure: Secondary | ICD-10-CM | POA: Diagnosis not present

## 2023-05-17 DIAGNOSIS — R609 Edema, unspecified: Secondary | ICD-10-CM

## 2023-05-17 LAB — IRON AND TIBC
Iron: 43 ug/dL — ABNORMAL LOW (ref 45–182)
Saturation Ratios: 23 % (ref 17.9–39.5)
TIBC: 188 ug/dL — ABNORMAL LOW (ref 250–450)
UIBC: 145 ug/dL

## 2023-05-17 LAB — COMPREHENSIVE METABOLIC PANEL
ALT: 21 U/L (ref 0–44)
AST: 30 U/L (ref 15–41)
Albumin: 2.4 g/dL — ABNORMAL LOW (ref 3.5–5.0)
Alkaline Phosphatase: 222 U/L — ABNORMAL HIGH (ref 38–126)
Anion gap: 9 (ref 5–15)
BUN: 52 mg/dL — ABNORMAL HIGH (ref 8–23)
CO2: 23 mmol/L (ref 22–32)
Calcium: 8.4 mg/dL — ABNORMAL LOW (ref 8.9–10.3)
Chloride: 106 mmol/L (ref 98–111)
Creatinine, Ser: 2.43 mg/dL — ABNORMAL HIGH (ref 0.61–1.24)
GFR, Estimated: 27 mL/min — ABNORMAL LOW (ref >60)
Glucose, Bld: 173 mg/dL — ABNORMAL HIGH (ref 70–99)
Potassium: 4.6 mmol/L (ref 3.5–5.1)
Sodium: 138 mmol/L (ref 135–145)
Total Bilirubin: 0.6 mg/dL (ref <1.2)
Total Protein: 7.4 g/dL (ref 6.5–8.1)

## 2023-05-17 LAB — CBC WITH DIFFERENTIAL/PLATELET
Abs Immature Granulocytes: 0.19 10*3/uL — ABNORMAL HIGH (ref 0.00–0.07)
Basophils Absolute: 0 10*3/uL (ref 0.0–0.1)
Basophils Relative: 0 %
Eosinophils Absolute: 0.1 10*3/uL (ref 0.0–0.5)
Eosinophils Relative: 1 %
HCT: 24 % — ABNORMAL LOW (ref 39.0–52.0)
Hemoglobin: 7.5 g/dL — ABNORMAL LOW (ref 13.0–17.0)
Immature Granulocytes: 2 %
Lymphocytes Relative: 9 %
Lymphs Abs: 0.8 10*3/uL (ref 0.7–4.0)
MCH: 33.9 pg (ref 26.0–34.0)
MCHC: 31.3 g/dL (ref 30.0–36.0)
MCV: 108.6 fL — ABNORMAL HIGH (ref 80.0–100.0)
Monocytes Absolute: 0.8 10*3/uL (ref 0.1–1.0)
Monocytes Relative: 10 %
Neutro Abs: 6.7 10*3/uL (ref 1.7–7.7)
Neutrophils Relative %: 78 %
Platelets: 297 10*3/uL (ref 150–400)
RBC: 2.21 MIL/uL — ABNORMAL LOW (ref 4.22–5.81)
RDW: 14.3 % (ref 11.5–15.5)
WBC: 8.6 10*3/uL (ref 4.0–10.5)
nRBC: 0 % (ref 0.0–0.2)

## 2023-05-17 LAB — PROCALCITONIN: Procalcitonin: 0.98 ng/mL

## 2023-05-17 LAB — FOLATE: Folate: 21.4 ng/mL (ref >5.9)

## 2023-05-17 LAB — CULTURE, BLOOD (ROUTINE X 2)
Culture: NO GROWTH
Culture: NO GROWTH
Special Requests: ADEQUATE
Special Requests: ADEQUATE

## 2023-05-17 LAB — RETICULOCYTES
Immature Retic Fract: 34.6 % — ABNORMAL HIGH (ref 2.3–15.9)
RBC.: 2.2 MIL/uL — ABNORMAL LOW (ref 4.22–5.81)
Retic Count, Absolute: 73.9 10*3/uL (ref 19.0–186.0)
Retic Ct Pct: 3.4 % — ABNORMAL HIGH (ref 0.4–3.1)

## 2023-05-17 LAB — GLUCOSE, CAPILLARY
Glucose-Capillary: 182 mg/dL — ABNORMAL HIGH (ref 70–99)
Glucose-Capillary: 188 mg/dL — ABNORMAL HIGH (ref 70–99)
Glucose-Capillary: 142 mg/dL — ABNORMAL HIGH (ref 70–99)
Glucose-Capillary: 140 mg/dL — ABNORMAL HIGH (ref 70–99)

## 2023-05-17 LAB — VITAMIN B12: Vitamin B-12: 1305 pg/mL — ABNORMAL HIGH (ref 180–914)

## 2023-05-17 LAB — FERRITIN: Ferritin: 327 ng/mL (ref 24–336)

## 2023-05-17 MED ORDER — FUROSEMIDE 10 MG/ML IJ SOLN
80.0000 mg | Freq: Two times a day (BID) | INTRAMUSCULAR | Status: DC
  Administered 2023-05-17 – 2023-05-20 (×7): 80 mg via INTRAVENOUS
  Filled 2023-05-17 (×9): qty 8

## 2023-05-17 NOTE — Progress Notes (Signed)
Patient refusing lab draw until after 0700

## 2023-05-17 NOTE — Progress Notes (Addendum)
PROGRESS NOTE    David OSTERGARD  ZOX:[id number] DOB: [birth year] DOA: 05/11/2023 PCP: Renford Dills, MD   77 y.o. male patient with a PMHx of CAD (PCI), HTN, dyslipidemia, chronic systolic/diastolic CHF (May 05, 2023: Echo EF 40-45%), pulmonary hypertension (51 mmHg), anemia of chronic illness, DM-2, neuropathy, CKD-4, and possible OSA who was discharged 12/23 for CHF exacerbation, acute hypoxic resp failure, and PNA declined thoracentesis then, who presented from NH after being there for 4 hrs with acute respiratory distress. when EMS arrived, he was saturating in the 60s, and they put him on CPAP and he only improved to the 70s. He failed NRM.  tachycardic, and hypertensive to 140s 150s with tachypnea accessory muscle use and respiratory failure despite CPAP ventilation. He became lethargic, so he was intubated.Marland Kitchen He quit smoking many yrs ago and he smoked few cigarettes daily.    Significant Events 12/24Given Lasix 80 mg IV in ED 12/24 Intubated on Propofol  12/24 thoracentesis -    Subjective: Complains of some shortness of breath, denies chest pain   Assessment and Plan:  Acute Hypoxic Respiratory Failure Due to pneumonia, pleural effusion, and volume overload/pulmonary edema, as below  Acute chronic combined CHF, RV failure Pulmonary hypertension -Last echo 12/24 with a EF 40-45%, moderately reduced RV, moderately elevated PA systolic pressures -Remains volume overloaded and hypoxic, follow-up x-ray,  -Restart IV Lasix today -GDMT limited by CKD 4 -Unfortunately patient continues to refuse morning labs, emphasized importance   Hospital Acquired Pneumonia -Completed 5 days of cefepime -Respiratory culture with Staph epidermidis, likely contaminant Blood cx NGTD x3   Transudative Pleural Effusion   1.9 L removed on 12/24 Cytology without malignant cells, cx with Ngx3 Follow intermittent CXR   AKI on CKD IV Baseline creatinine around 2.5-3 Creatinine 2.8 yesterday,  restarting IV Lasix  Acute on chronic anemia -Hemoglobin has been in the 7.1 range last few days, check anemia panel, transfuse if it drops any further   Coronary Artery Disease Aspirin, statin   PAD History of multiple toe amputations, follow-up with vascular surgery -Continue aspirin and statin  Type 2 Diabetes Stable, SSI   Peripheral Neuropathy   Suspected OSA Needs outpatient sleep study    DVT prophylaxis: heparin Code Status: full Family Communication: none Disposition Plan: Likely SNF  Consultants:    Procedures:   Antimicrobials:    Objective: Vitals:   05/17/23 0505 05/17/23 0716 05/17/23 1115 05/17/23 1122  BP: 138/66 135/66 132/71   Pulse: 82 79 78 80  Resp: (!) 21 18 20 20   Temp: 97.8 F (36.6 C) 98.2 F (36.8 C) 98.7 F (37.1 C)   TempSrc: Oral Oral Oral   SpO2: 92% 91% (!) 86% 93%  Weight:      Height:        Intake/Output Summary (Last 24 hours) at 05/17/2023 1211 Last data filed at 05/17/2023 0718 Gross per 24 hour  Intake 80 ml  Output 1375 ml  Net -1295 ml   Filed Weights   05/14/23 0500 05/15/23 0705 05/16/23 0405  Weight: 98.3 kg 98.3 kg 102.7 kg    Examination:  General exam: Chronically ill male laying in bed, AAOx3 HEENT: Positive JVD CVS: S1-S2, regular rhythm Lungs: Poor air movement bilaterally, few bilateral Rales Abd: nondistended, soft and nontender.Normal bowel sounds heard. Extremities: Chronic ischemic skin changes in both feet, amputation of multiple toes Skin: As above  Data Reviewed:   CBC: Recent Labs  Lab 05/11/23 2310 05/12/23 0043 05/12/23 0419 05/13/23 0236 05/13/23 8119  05/14/23 0303 05/15/23 0820 05/16/23 0229  WBC 13.1*  --  13.8* 11.2*  --  10.5 10.6* 8.9  NEUTROABS 11.1*  --   --   --   --   --   --  7.8*  HGB 9.2*   < > 9.0* 7.7* 8.0* 7.0* 7.1* 7.1*  HCT 30.8*   < > 29.0* 24.4* 25.3* 22.7* 22.5* 22.6*  MCV 113.2*  --  109.8* 106.6*  --  109.7* 109.8* 110.2*  PLT 313  --  276 198  --   214 248 256   < > = values in this interval not displayed.   Basic Metabolic Panel: Recent Labs  Lab 05/12/23 0419 05/13/23 0236 05/14/23 0303 05/15/23 0820 05/16/23 0229  NA 137 138 139 140 140  K 4.9 4.5 4.6 4.4 4.6  CL 106 106 108 108 108  CO2 19* 21* 20* 20* 22  GLUCOSE 207* 119* 98 119* 155*  BUN 54* 57* 57* 62* 59*  CREATININE 2.88* 2.58* 2.61* 3.17* 2.86*  CALCIUM 8.9 8.5* 8.6* 8.6* 8.7*  MG 2.7* 2.5* 2.6* 2.8* 3.0*  PHOS 5.8*  --   --   --  4.4   GFR: Estimated Creatinine Clearance: 26.8 mL/min (A) (by C-G formula based on SCr of 2.86 mg/dL (H)). Liver Function Tests: Recent Labs  Lab 05/11/23 2310 05/12/23 1214 05/16/23 0229  AST 26  --  22  ALT 25  --  18  ALKPHOS 205*  --  202*  BILITOT 0.5  --  0.7  PROT 8.3* 6.8 6.9  ALBUMIN 3.0*  --  2.3*   No results for input(s): "LIPASE", "AMYLASE" in the last 168 hours. No results for input(s): "AMMONIA" in the last 168 hours. Coagulation Profile: Recent Labs  Lab 05/11/23 2310  INR 1.2   Cardiac Enzymes: No results for input(s): "CKTOTAL", "CKMB", "CKMBINDEX", "TROPONINI" in the last 168 hours. BNP (last 3 results) No results for input(s): "PROBNP" in the last 8760 hours. HbA1C: No results for input(s): "HGBA1C" in the last 72 hours. CBG: Recent Labs  Lab 05/16/23 1117 05/16/23 1555 05/16/23 2112 05/17/23 0553 05/17/23 1109  GLUCAP 204* 141* 190* 182* 188*   Lipid Profile: No results for input(s): "CHOL", "HDL", "LDLCALC", "TRIG", "CHOLHDL", "LDLDIRECT" in the last 72 hours. Thyroid Function Tests: No results for input(s): "TSH", "T4TOTAL", "FREET4", "T3FREE", "THYROIDAB" in the last 72 hours. Anemia Panel: No results for input(s): "VITAMINB12", "FOLATE", "FERRITIN", "TIBC", "IRON", "RETICCTPCT" in the last 72 hours. Urine analysis:    Component Value Date/Time   COLORURINE YELLOW 05/12/2023 0022   APPEARANCEUR HAZY (A) 05/12/2023 0022   LABSPEC 1.017 05/12/2023 0022   PHURINE 5.0 05/12/2023  0022   GLUCOSEU NEGATIVE 05/12/2023 0022   HGBUR SMALL (A) 05/12/2023 0022   BILIRUBINUR NEGATIVE 05/12/2023 0022   KETONESUR NEGATIVE 05/12/2023 0022   PROTEINUR 100 (A) 05/12/2023 0022   UROBILINOGEN 1.0 02/13/2015 1934   NITRITE NEGATIVE 05/12/2023 0022   LEUKOCYTESUR NEGATIVE 05/12/2023 0022   Sepsis Labs: @LABRCNTIP (procalcitonin:4,lacticidven:4)  ) Recent Results (from the past 240 hours)  Resp panel by RT-PCR (RSV, Flu A&B, Covid) Anterior Nasal Swab     Status: None   Collection Time: 05/12/23 12:21 AM   Specimen: Anterior Nasal Swab  Result Value Ref Range Status   SARS Coronavirus 2 by RT PCR NEGATIVE NEGATIVE Final   Influenza A by PCR NEGATIVE NEGATIVE Final   Influenza B by PCR NEGATIVE NEGATIVE Final    Comment: (NOTE) The Xpert Xpress SARS-CoV-2/FLU/RSV plus  assay is intended as an aid in the diagnosis of influenza from Nasopharyngeal swab specimens and should not be used as a sole basis for treatment. Nasal washings and aspirates are unacceptable for Xpert Xpress SARS-CoV-2/FLU/RSV testing.  Fact Sheet for Patients: BloggerCourse.com  Fact Sheet for Healthcare Providers: SeriousBroker.it  This test is not yet approved or cleared by the [place] FDA and has been authorized for detection and/or diagnosis of SARS-CoV-2 by FDA under an Emergency Use Authorization (EUA). This EUA will remain in effect (meaning this test can be used) for the duration of the COVID-19 declaration under Section 564(b)(1) of the Act, 21 U.S.C. section 360bbb-3(b)(1), unless the authorization is terminated or revoked.     Resp Syncytial Virus by PCR NEGATIVE NEGATIVE Final    Comment: (NOTE) Fact Sheet for Patients: BloggerCourse.com  Fact Sheet for Healthcare Providers: SeriousBroker.it  This test is not yet approved or cleared by the [place] FDA and has been  authorized for detection and/or diagnosis of SARS-CoV-2 by FDA under an Emergency Use Authorization (EUA). This EUA will remain in effect (meaning this test can be used) for the duration of the COVID-19 declaration under Section 564(b)(1) of the Act, 21 U.S.C. section 360bbb-3(b)(1), unless the authorization is terminated or revoked.  Performed at [facility], [address], [place], [place] [address]   Blood Culture (routine x 2)     Status: None   Collection Time: 05/12/23 12:34 AM   Specimen: BLOOD RIGHT ARM  Result Value Ref Range Status   Specimen Description BLOOD RIGHT ARM  Final   Special Requests   Final    BOTTLES DRAWN AEROBIC AND ANAEROBIC Blood Culture adequate volume   Culture   Final    NO GROWTH 5 DAYS Performed at [facility], [address] [place], [place] [address]    Report Status 05/17/2023 FINAL  Final  Blood Culture (routine x 2)     Status: None   Collection Time: 05/12/23 12:45 AM   Specimen: BLOOD RIGHT HAND  Result Value Ref Range Status   Specimen Description BLOOD RIGHT HAND  Final   Special Requests   Final    BOTTLES DRAWN AEROBIC AND ANAEROBIC Blood Culture adequate volume   Culture   Final    NO GROWTH 5 DAYS Performed at [facility], [address], [place], [place] [address]    Report Status 05/17/2023 FINAL  Final  MRSA Next Gen by PCR, Nasal     Status: None   Collection Time: 05/12/23  3:45 AM   Specimen: Nasal Mucosa; Nasal Swab  Result Value Ref Range Status   MRSA by PCR Next Gen NOT DETECTED NOT DETECTED Final    Comment: (NOTE) The GeneXpert MRSA Assay (FDA approved for NASAL specimens only), is one component of a comprehensive MRSA colonization surveillance program. It is not intended to diagnose MRSA infection nor to guide or monitor treatment for MRSA infections. Test performance is not FDA approved in patients less than 25 years old. Performed at [facility], [address] [place],  [place] [address]   Culture, Respiratory w Gram Stain     Status: None   Collection Time: 05/12/23  9:10 AM   Specimen: Tracheal Aspirate; Respiratory  Result Value Ref Range Status   Specimen Description TRACHEAL ASPIRATE  Final   Special Requests NONE  Final   Gram Stain   Final    NO WBC SEEN FEW GRAM POSITIVE COCCI IN  CLUSTERS RARE GRAM POSITIVE RODS Performed at [facility], [address] [place], [place] [address]    Culture ABUNDANT STAPHYLOCOCCUS EPIDERMIDIS  Final   Report Status 05/16/2023 FINAL  Final   Organism ID, Bacteria STAPHYLOCOCCUS EPIDERMIDIS  Final      Susceptibility   Staphylococcus epidermidis - MIC*    CIPROFLOXACIN <=0.5 SENSITIVE Sensitive     ERYTHROMYCIN <=0.25 SENSITIVE Sensitive     GENTAMICIN <=0.5 SENSITIVE Sensitive     OXACILLIN >=4 RESISTANT Resistant     TETRACYCLINE >=16 RESISTANT Resistant     VANCOMYCIN 1 SENSITIVE Sensitive     TRIMETH/SULFA <=10 SENSITIVE Sensitive     CLINDAMYCIN <=0.25 SENSITIVE Sensitive     RIFAMPIN <=0.5 SENSITIVE Sensitive     Inducible Clindamycin NEGATIVE Sensitive     * ABUNDANT STAPHYLOCOCCUS EPIDERMIDIS  Body fluid culture w Gram Stain     Status: None   Collection Time: 05/12/23 10:49 AM   Specimen: Pleural Fluid  Result Value Ref Range Status   Specimen Description PLEURAL  Final   Special Requests NONE  Final   Gram Stain NO WBC SEEN NO ORGANISMS SEEN   Final   Culture   Final    NO GROWTH 3 DAYS Performed at [facility], [address] [place], [place] [address]    Report Status 05/15/2023 FINAL  Final     Radiology Studies: No results found.   Scheduled Meds:  aspirin EC  81 mg Oral Daily   atorvastatin  40 mg Oral Daily   Chlorhexidine Gluconate Cloth  6 each Topical Daily   ferrous sulfate  325 mg Oral Q breakfast   furosemide  80 mg Intravenous BID   Gerhardt's butt cream   Topical BID   heparin  5,000 Units Subcutaneous Q8H   hydrocerin   Topical BID   insulin aspart   0-15 Units Subcutaneous TID WC   insulin aspart  0-5 Units Subcutaneous QHS   isosorbide-hydrALAZINE  2 tablet Oral TID   leptospermum manuka honey  1 Application Topical Daily   traZODone  100 mg Oral QHS   Continuous Infusions:   LOS: 5 days    Time spent:    Zannie Cove, MD [facility]   05/17/2023, 12:11 PM

## 2023-05-17 NOTE — Progress Notes (Signed)
IV team to bedside to evaluate pt's current vascular access and need for intervention. Pt refusing assessment and treatment, states that he is busy and that "you folks think you're more important than anyone else". Discussed purpose of IV, reason it is needed for treatment. Pt continues to refuse assessment and interventions. Primary RN informed.

## 2023-05-17 NOTE — Progress Notes (Signed)
Patient is being kind and cooperative.  He allowed lab draw and PIV insertion.  His appetite is poor.  Nutrition consulted.

## 2023-05-17 NOTE — Progress Notes (Signed)
Patient refused lab draw again.  This was after the 7:19 attempt.  Phlebotomist said that lab stops after three attempts.

## 2023-05-17 NOTE — Progress Notes (Signed)
Pt refusing lab draw again. States he wants to wait until after he eats breakfast.

## 2023-05-17 NOTE — Progress Notes (Signed)
Patient's O2 sats dropped to 86-89% on 3 liters nasal cannula.  Placed pt on 6 liters nasal cannula.  O2 sats 93-95%.  Pt said that he no longer feels shob.  Notified MD.

## 2023-05-18 ENCOUNTER — Encounter (HOSPITAL_COMMUNITY): Payer: Self-pay | Admitting: Pulmonary Disease

## 2023-05-18 DIAGNOSIS — I5043 Acute on chronic combined systolic (congestive) and diastolic (congestive) heart failure: Secondary | ICD-10-CM | POA: Diagnosis not present

## 2023-05-18 LAB — BASIC METABOLIC PANEL
Anion gap: 10 (ref 5–15)
BUN: 48 mg/dL — ABNORMAL HIGH (ref 8–23)
CO2: 21 mmol/L — ABNORMAL LOW (ref 22–32)
Calcium: 8.6 mg/dL — ABNORMAL LOW (ref 8.9–10.3)
Chloride: 108 mmol/L (ref 98–111)
Creatinine, Ser: 2.32 mg/dL — ABNORMAL HIGH (ref 0.61–1.24)
GFR, Estimated: 28 mL/min — ABNORMAL LOW (ref >60)
Glucose, Bld: 151 mg/dL — ABNORMAL HIGH (ref 70–99)
Potassium: 4.5 mmol/L (ref 3.5–5.1)
Sodium: 139 mmol/L (ref 135–145)

## 2023-05-18 LAB — CBC
HCT: 24 % — ABNORMAL LOW (ref 39.0–52.0)
Hemoglobin: 7.5 g/dL — ABNORMAL LOW (ref 13.0–17.0)
MCH: 33.9 pg (ref 26.0–34.0)
MCHC: 31.3 g/dL (ref 30.0–36.0)
MCV: 108.6 fL — ABNORMAL HIGH (ref 80.0–100.0)
Platelets: 264 10*3/uL (ref 150–400)
RBC: 2.21 MIL/uL — ABNORMAL LOW (ref 4.22–5.81)
RDW: 14.3 % (ref 11.5–15.5)
WBC: 9.8 10*3/uL (ref 4.0–10.5)
nRBC: 0 % (ref 0.0–0.2)

## 2023-05-18 LAB — GLUCOSE, CAPILLARY
Glucose-Capillary: 160 mg/dL — ABNORMAL HIGH (ref 70–99)
Glucose-Capillary: 180 mg/dL — ABNORMAL HIGH (ref 70–99)
Glucose-Capillary: 187 mg/dL — ABNORMAL HIGH (ref 70–99)
Glucose-Capillary: 164 mg/dL — ABNORMAL HIGH (ref 70–99)

## 2023-05-18 MED ORDER — DARBEPOETIN ALFA 60 MCG/0.3ML IJ SOSY
60.0000 ug | PREFILLED_SYRINGE | Freq: Once | INTRAMUSCULAR | Status: AC
  Administered 2023-05-18: 60 ug via SUBCUTANEOUS
  Filled 2023-05-18: qty 0.3

## 2023-05-18 MED ORDER — ALBUTEROL SULFATE (2.5 MG/3ML) 0.083% IN NEBU
INHALATION_SOLUTION | RESPIRATORY_TRACT | Status: AC
  Filled 2023-05-18: qty 3

## 2023-05-18 MED ORDER — ENSURE ENLIVE PO LIQD
237.0000 mL | Freq: Two times a day (BID) | ORAL | Status: DC
  Administered 2023-05-19 – 2023-05-25 (×6): 237 mL via ORAL

## 2023-05-18 MED ORDER — ALBUTEROL SULFATE (2.5 MG/3ML) 0.083% IN NEBU
2.5000 mg | INHALATION_SOLUTION | RESPIRATORY_TRACT | Status: DC | PRN
  Administered 2023-05-18: 2.5 mg via RESPIRATORY_TRACT

## 2023-05-18 MED ORDER — INSULIN GLARGINE-YFGN 100 UNIT/ML ~~LOC~~ SOLN
8.0000 [IU] | Freq: Every day | SUBCUTANEOUS | Status: DC
  Administered 2023-05-19 – 2023-05-20 (×2): 8 [IU] via SUBCUTANEOUS
  Filled 2023-05-18 (×3): qty 0.08

## 2023-05-18 NOTE — Care Management Important Message (Signed)
Important Message  Patient Details  Name: David [name] MRN: [id number] Date of Birth: November 03, 1945   Important Message Given:  Yes - Medicare IM     [name] 05/18/2023, 8:41 AM

## 2023-05-18 NOTE — Progress Notes (Signed)
Physical Therapy Treatment Patient Details Name: David [name] MRN: [id number] DOB: May 18, 1946 Today's Date: 05/18/2023   History of Present Illness 77 y.o. male presented 05/11/23 with Acute hypoxemic respiratory failure,  Acute pulmonary edema,  HAP,  Right pleural effusion s/p thoracentesis.  PMHx of CAD (PCI), HTN, dyslipidemia, chronic systolic/diastolic CHF, pulmonary hypertension, anemia of chronic illness, DM-2, neuropathy, CKD-4, and possible OSA.    PT Comments  Patient resting in bed and agreeable to sit up EOB but declining transfer OOB today. Pt required min-mod assist for bed mobility and heavy use of bed features to raise trunk. Pt required mod fading to min assist to sit upright due to strong posterior lean and cues intermittently to shift trunk anteriorly and use UE's to hold knees for support. Pt completed LE exercises at EOB with support and required rest breaks between sets due to fatigue. EOS pt returned to supine and able to assist with repositioning using headboard in trendelenburg tilt of bed. Patient will benefit from continued inpatient follow up therapy, <3 hours/day. Will continue to progress as able.    If plan is discharge home, recommend the following: Two people to help with walking and/or transfers;Assist for transportation;Help with stairs or ramp for entrance;Two people to help with bathing/dressing/bathroom;Assistance with cooking/housework   Can travel by private vehicle     No  Equipment Recommendations  None recommended by PT    Recommendations for Other Services OT consult     Precautions / Restrictions Precautions Precautions: Fall Restrictions Weight Bearing Restrictions Per Provider Order: No     Mobility  Bed Mobility Overal bed mobility: Needs Assistance Bed Mobility: Supine to Sit, Sit to Supine     Supine to sit: Min assist, Used rails, HOB elevated Sit to supine: Mod assist, HOB elevated, Used rails   General bed mobility  comments: cues for sequencing use of bed rail and to bring LE's off EOB pt required min assist with use of bed pad to pivot hips. Assist to maintain upright balance at EOB due to heavy posterior lean initially. Mod assist to return to supine.    Transfers                   General transfer comment: pt with poor sititng balacne and posterior lean, deferred sit<>stand for +2 assist.    Ambulation/Gait                   Stairs             Wheelchair Mobility     Tilt Bed    Modified Rankin (Stroke Patients Only)       Balance Overall balance assessment: Needs assistance Sitting-balance support: Bilateral upper extremity supported, Feet supported Sitting balance-Leahy Scale: Poor Sitting balance - Comments: cues for anterior trunk lean Postural control: Posterior lean                                  Cognition Arousal: Alert Behavior During Therapy: WFL for tasks assessed/performed Overall Cognitive Status: Within Functional Limits for tasks assessed                                          Exercises General Exercises - Lower Extremity Long Arc Quad: Both, 10 reps, Seated, AROM, Limitations Long Arc Quad Limitations: 2x5 Hip  Flexion/Marching: AROM, Seated, Both, 5 reps    General Comments        Pertinent Vitals/Pain Pain Assessment Pain Assessment: No/denies pain    Home Living                          Prior Function            PT Goals (current goals can now be found in the care plan section) Acute Rehab PT Goals PT Goal Formulation: With patient Time For Goal Achievement: 05/28/23 Potential to Achieve Goals: Fair Progress towards PT goals: Progressing toward goals    Frequency    Min 1X/week      PT Plan      Co-evaluation              AM-PAC PT "6 Clicks" Mobility   Outcome Measure  Help needed turning from your back to your side while in a flat bed without using  bedrails?: A Little Help needed moving from lying on your back to sitting on the side of a flat bed without using bedrails?: A Lot Help needed moving to and from a bed to a chair (including a wheelchair)?: A Lot Help needed standing up from a chair using your arms (e.g., wheelchair or bedside chair)?: Total Help needed to walk in hospital room?: Total Help needed climbing 3-5 steps with a railing? : Total 6 Click Score: 10    End of Session Equipment Utilized During Treatment: Oxygen Activity Tolerance: Patient tolerated treatment well Patient left: with call bell/phone within reach;in bed;with bed alarm set Nurse Communication: Mobility status PT Visit Diagnosis: Muscle weakness (generalized) (M62.81);Difficulty in walking, not elsewhere classified (R26.2);Pain     Time: 8119-1478 PT Time Calculation (min) (ACUTE ONLY): 30 min  Charges:    $Therapeutic Exercise: 8-22 mins $Therapeutic Activity: 8-22 mins PT General Charges $$ ACUTE PT VISIT: 1 Visit                     Wynn Maudlin, DPT Acute Rehabilitation Services Office [phone]  05/18/23 5:06 PM

## 2023-05-18 NOTE — Progress Notes (Signed)
Heart Failure Nurse Navigator Progress Note  PCP: Renford Dills, MD PCP-Cardiologist: Patwardhan Admission Diagnosis: None Admitted from: Operating Room Services via EMS  Presentation:   OZIE PHOU presented from [facility] a few hours after being discharged from the hospital for respiratory distress, O2 sats 84%, given a neb treatment and placed on CPAP. At ED patient was somewhat somnolent, appeared tired, decision to intubate. CXR showed asymmetric opacifications, likely pulmonary edema plus aspiration.   Patient was educated on the sign and symptoms of heart failure, daily weights, when to call his doctor or go to the ED, Diet/ fluid restrictions, patient reported that his wife brings home take out food a lot during the week, continued education on taking all medications as prescribed and attending all medical appointments. Patient verbalized his understanding, a HF TOC appointment was scheduled for 05/27/2023 @ 2 pm, per Dr Jomarie Longs request.   ECHO/ LVEF: 40-45%  Clinical Course:  Past Medical History:  Diagnosis Date   Arthritis    CAD in native artery    s/p stent in 11/18   CHF (congestive heart failure) (HCC)    normal echo in 11/18   CKD (chronic kidney disease)    DDD (degenerative disc disease), cervical    Diabetes mellitus with complication (HCC)    Type II   Diabetic neuropathy (HCC)    Diabetic retinopathy (HCC)    GERD (gastroesophageal reflux disease)    Glaucoma    Hypertension      Social History   Socioeconomic History   Marital status: Married    Spouse name: [name]   Number of children: 2   Years of education: 16   Highest education level: Master's degree (e.g., MA, MS, MEng, MEd, MSW, MBA)  Occupational History   Occupation: Retired  Tobacco Use   Smoking status: Former    Current packs/day: 0.00    Average packs/day: 0.3 packs/day for 5.0 years (1.3 ttl pk-yrs)    Types: Cigarettes    Start date: 75    Quit date: 1996    Years since  quitting: 29.0    Passive exposure: Past   Smokeless tobacco: Never  Vaping Use   Vaping status: Never Used  Substance and Sexual Activity   Alcohol use: Not Currently    Comment: occ   Drug use: No   Sexual activity: Yes  Other Topics Concern   Not on file  Social History Narrative   Not on file   Social Drivers of Health   Financial Resource Strain: Low Risk  (05/18/2023)   Overall Financial Resource Strain (CARDIA)    Difficulty of Paying Living Expenses: Not very hard  Food Insecurity: No Food Insecurity (05/14/2023)   Hunger Vital Sign    Worried About Running Out of Food in the Last Year: Never true    Ran Out of Food in the Last Year: Never true  Transportation Needs: No Transportation Needs (05/18/2023)   PRAPARE - Administrator, Civil Service (Medical): No    Lack of Transportation (Non-Medical): No  Recent Concern: Transportation Needs - Unmet Transportation Needs (05/05/2023)   PRAPARE - Transportation    Lack of Transportation (Medical): Yes    Lack of Transportation (Non-Medical): Yes  Physical Activity: Inactive (08/20/2017)   Exercise Vital Sign    Days of Exercise per Week: 0 days    Minutes of Exercise per Session: 0 min  Stress: No Stress Concern Present (08/20/2017)   [employer] of Occupational Health - Occupational Stress  Questionnaire    Feeling of Stress : Not at all  Social Connections: Socially Integrated (12/19/2017)   Social Connection and Isolation Panel [NHANES]    Frequency of Communication with Friends and Family: Twice a week    Frequency of Social Gatherings with Friends and Family: Twice a week    Attends Religious Services: 1 to 4 times per year    Active Member of [employer] or Organizations: Yes    Attends Banker Meetings: 1 to 4 times per year    Marital Status: Married   Water engineer and Provision:  Detailed education and instructions provided on heart failure disease management including the  following:  Signs and symptoms of Heart Failure When to call the physician Importance of daily weights Low sodium diet Fluid restriction Medication management Anticipated future follow-up appointments  Patient education given on each of the above topics.  Patient acknowledges understanding via teach back method and acceptance of all instructions.  Education Materials:  "Living Better With Heart Failure" Booklet, HF zone tool, & Daily Weight Tracker Tool.  Patient has scale at home: yes Patient has pill box at home: NA    High Risk Criteria for Readmission and/or Poor Patient Outcomes: Heart failure hospital admissions (last 6 months): 2  No Show rate: 5% Difficult social situation: No, lives with wife Demonstrates medication adherence: yes Primary Language: English Literacy level: Reading, writing, and comprehension  Barriers of Care:   Readmission Diet/ fluid restrictions/ daily weights  Considerations/Referrals:   Referral made to Heart Failure Pharmacist Stewardship: yes Referral made to Heart Failure CSW/NCM TOC: No Referral made to Heart & Vascular TOC clinic: yes, 05/27/2023 @ 2 pm  Items for Follow-up on DC/TOC: Continued HF education Diet/ fluid restrictions/ daily weights   Rhae Hammock, BSN, RN Heart Failure Teacher, adult education Only

## 2023-05-18 NOTE — TOC Progression Note (Signed)
Transition of Care [facility]) - Progression Note    Patient Details  Name: David [name] MRN: [id number] Date of Birth: 21-Aug-1945  Transition of Care [facility] CM/SW Contact  Michaela Corner, [place] Phone Number: 05/18/2023, 10:40 AM  Clinical Narrative:   CSW spoke with pt about PT recs for SNF. Pt states he does not want to go to any SNF at this time. Per chart review, spouse has previously stated she wants pt to go to SNF (preferably [facility]). CSW informed pt of wifes preference for [facility] - pt still denied SNF at this time. CSW left VM for pts wife, [name] to further discuss.          Expected Discharge Plan and Services                                               Social Determinants of Health (SDOH) Interventions SDOH Screenings   Food Insecurity: No Food Insecurity (05/14/2023)  Housing: Low Risk  (05/14/2023)  Transportation Needs: No Transportation Needs (05/14/2023)  Recent Concern: Transportation Needs - Unmet Transportation Needs (05/05/2023)  Utilities: Not At Risk (05/14/2023)  Financial Resource Strain: Low Risk  (08/20/2017)  Physical Activity: Inactive (08/20/2017)  Social Connections: Socially Integrated (12/19/2017)  Stress: No Stress Concern Present (08/20/2017)  Tobacco Use: Medium Risk (05/14/2023)    Readmission Risk Interventions     No data to display

## 2023-05-18 NOTE — Progress Notes (Signed)
PROGRESS NOTE    YURIAH MYLER  ACZ:[id number] DOB: 1945-10-09 DOA: 05/11/2023 PCP: Renford Dills, MD   77 y.o. male patient with a PMHx of CAD (PCI), HTN, dyslipidemia, chronic systolic/diastolic CHF (May 05, 2023: Echo EF 40-45%), pulmonary hypertension (51 mmHg), anemia of chronic illness, DM-2, neuropathy, CKD-4, and possible OSA who was discharged 12/23 for CHF exacerbation, acute hypoxic resp failure, and PNA declined thoracentesis then, who presented from NH after being there for 4 hrs with acute respiratory distress. when EMS arrived, he was saturating in the 60s, and they put him on CPAP and he only improved to the 70s. He failed NRM.  tachycardic, and hypertensive to 140s 150s with tachypnea accessory muscle use and respiratory failure despite CPAP ventilation. He became lethargic, so he was intubated.Marland Kitchen He quit smoking many yrs ago and he smoked few cigarettes daily.    Significant Events 12/24Given Lasix 80 mg IV in ED 12/24 Intubated on Propofol  12/24 thoracentesis -Complicated by AKI/CKD, slowly improving    Subjective: Nose is blocked, some shortness of breath  Assessment and Plan:  Acute Hypoxic Respiratory Failure Due to pneumonia, pleural effusion, and volume overload/pulmonary edema, as below  Acute chronic combined CHF, RV failure Pulmonary hypertension -Last echo 12/24 with a EF 40-45%, moderately reduced RV, moderately elevated PA systolic pressures -Remains volume overloaded and hypoxic -Restarted IV Lasix yesterday, 5.9 L negative, creatinine improving, continue Lasix, wean O2 -GDMT limited by CKD 4 -PT OT following, SNF recommended   Hospital Acquired Pneumonia -Completed 5 days of cefepime -Respiratory culture with Staph epidermidis, likely contaminant Blood cx NGTD x3   Transudative Pleural Effusion   1.9 L removed on 12/24 Cytology without malignant cells, cx with Ngx3 Follow intermittent CXR   AKI on CKD IV Baseline creatinine around  2.5-3 Creatinine 2.8 yesterday, restarting IV Lasix  Acute on chronic anemia -Hemoglobin has been in the 7.1 range last few days, check anemia panel, transfuse if it drops any further   Coronary Artery Disease Aspirin, statin   PAD History of multiple toe amputations, follow-up with vascular surgery -Continue aspirin and statin  Type 2 Diabetes Stable, SSI   Peripheral Neuropathy   Suspected OSA Needs outpatient sleep study    DVT prophylaxis: heparin Code Status: full Family Communication: none Disposition Plan: Likely SNF  Consultants:    Procedures:   Antimicrobials:    Objective: Vitals:   05/17/23 2357 05/18/23 0511 05/18/23 0700 05/18/23 0825  BP: 123/65 (!) 140/64  (!) 139/57  Pulse:    72  Resp: 18 15  13   Temp: 98.1 F (36.7 C) 98.9 F (37.2 C)  99.1 F (37.3 C)  TempSrc: Oral Oral  Oral  SpO2: 95% 95%  97%  Weight:   98.9 kg   Height:        Intake/Output Summary (Last 24 hours) at 05/18/2023 1237 Last data filed at 05/18/2023 1021 Gross per 24 hour  Intake 220 ml  Output 2825 ml  Net -2605 ml   Filed Weights   05/15/23 0705 05/16/23 0405 05/18/23 0700  Weight: 98.3 kg 102.7 kg 98.9 kg    Examination:  General exam: Chronically ill male laying in bed, AAOx3 HEENT: Positive JVD CVS: S1-S2, regular rhythm Lungs: Poor air movement bilaterally, few bilateral Rales Abd: nondistended, soft and nontender.Normal bowel sounds heard. Extremities: Chronic ischemic skin changes in both feet, amputation of multiple toes Skin: As above  Data Reviewed:   CBC: Recent Labs  Lab 05/11/23 2310 05/12/23 0043 05/14/23 0303  05/15/23 0820 05/16/23 0229 05/17/23 1449 05/18/23 0246  WBC 13.1*   < > 10.5 10.6* 8.9 8.6 9.8  NEUTROABS 11.1*  --   --   --  7.8* 6.7  --   HGB 9.2*   < > 7.0* 7.1* 7.1* 7.5* 7.5*  HCT 30.8*   < > 22.7* 22.5* 22.6* 24.0* 24.0*  MCV 113.2*   < > 109.7* 109.8* 110.2* 108.6* 108.6*  PLT 313   < > 214 248 256 297 264   <  > = values in this interval not displayed.   Basic Metabolic Panel: Recent Labs  Lab 05/12/23 0419 05/13/23 0236 05/14/23 0303 05/15/23 0820 05/16/23 0229 05/17/23 1449 05/18/23 0246  NA 137 138 139 140 140 138 139  K 4.9 4.5 4.6 4.4 4.6 4.6 4.5  CL 106 106 108 108 108 106 108  CO2 19* 21* 20* 20* 22 23 21*  GLUCOSE 207* 119* 98 119* 155* 173* 151*  BUN 54* 57* 57* 62* 59* 52* 48*  CREATININE 2.88* 2.58* 2.61* 3.17* 2.86* 2.43* 2.32*  CALCIUM 8.9 8.5* 8.6* 8.6* 8.7* 8.4* 8.6*  MG 2.7* 2.5* 2.6* 2.8* 3.0*  --   --   PHOS 5.8*  --   --   --  4.4  --   --    GFR: Estimated Creatinine Clearance: 32.5 mL/min (A) (by C-G formula based on SCr of 2.32 mg/dL (H)). Liver Function Tests: Recent Labs  Lab 05/11/23 2310 05/12/23 1214 05/16/23 0229 05/17/23 1449  AST 26  --  22 30  ALT 25  --  18 21  ALKPHOS 205*  --  202* 222*  BILITOT 0.5  --  0.7 0.6  PROT 8.3* 6.8 6.9 7.4  ALBUMIN 3.0*  --  2.3* 2.4*   No results for input(s): "LIPASE", "AMYLASE" in the last 168 hours. No results for input(s): "AMMONIA" in the last 168 hours. Coagulation Profile: Recent Labs  Lab 05/11/23 2310  INR 1.2   Cardiac Enzymes: No results for input(s): "CKTOTAL", "CKMB", "CKMBINDEX", "TROPONINI" in the last 168 hours. BNP (last 3 results) No results for input(s): "PROBNP" in the last 8760 hours. HbA1C: No results for input(s): "HGBA1C" in the last 72 hours. CBG: Recent Labs  Lab 05/17/23 1109 05/17/23 1612 05/17/23 2046 05/18/23 0646 05/18/23 1121  GLUCAP 188* 142* 140* 160* 180*   Lipid Profile: No results for input(s): "CHOL", "HDL", "LDLCALC", "TRIG", "CHOLHDL", "LDLDIRECT" in the last 72 hours. Thyroid Function Tests: No results for input(s): "TSH", "T4TOTAL", "FREET4", "T3FREE", "THYROIDAB" in the last 72 hours. Anemia Panel: Recent Labs    05/17/23 1449  VITAMINB12 1,305*  FOLATE 21.4  FERRITIN 327  TIBC 188*  IRON 43*  RETICCTPCT 3.4*   Urine analysis:     Component Value Date/Time   COLORURINE YELLOW 05/12/2023 0022   APPEARANCEUR HAZY (A) 05/12/2023 0022   LABSPEC 1.017 05/12/2023 0022   PHURINE 5.0 05/12/2023 0022   GLUCOSEU NEGATIVE 05/12/2023 0022   HGBUR SMALL (A) 05/12/2023 0022   BILIRUBINUR NEGATIVE 05/12/2023 0022   KETONESUR NEGATIVE 05/12/2023 0022   PROTEINUR 100 (A) 05/12/2023 0022   UROBILINOGEN 1.0 02/13/2015 1934   NITRITE NEGATIVE 05/12/2023 0022   LEUKOCYTESUR NEGATIVE 05/12/2023 0022   Sepsis Labs: @LABRCNTIP (procalcitonin:4,lacticidven:4)  ) Recent Results (from the past 240 hours)  Resp panel by RT-PCR (RSV, Flu A&B, Covid) Anterior Nasal Swab     Status: None   Collection Time: 05/12/23 12:21 AM   Specimen: Anterior Nasal Swab  Result Value Ref  Range Status   SARS Coronavirus 2 by RT PCR NEGATIVE NEGATIVE Final   Influenza A by PCR NEGATIVE NEGATIVE Final   Influenza B by PCR NEGATIVE NEGATIVE Final    Comment: (NOTE) The Xpert Xpress SARS-CoV-2/FLU/RSV plus assay is intended as an aid in the diagnosis of influenza from Nasopharyngeal swab specimens and should not be used as a sole basis for treatment. Nasal washings and aspirates are unacceptable for Xpert Xpress SARS-CoV-2/FLU/RSV testing.  Fact Sheet for Patients: BloggerCourse.com  Fact Sheet for Healthcare Providers: SeriousBroker.it  This test is not yet approved or cleared by the [place] FDA and has been authorized for detection and/or diagnosis of SARS-CoV-2 by FDA under an Emergency Use Authorization (EUA). This EUA will remain in effect (meaning this test can be used) for the duration of the COVID-19 declaration under Section 564(b)(1) of the Act, 21 U.S.C. section 360bbb-3(b)(1), unless the authorization is terminated or revoked.     Resp Syncytial Virus by PCR NEGATIVE NEGATIVE Final    Comment: (NOTE) Fact Sheet for Patients: BloggerCourse.com  Fact  Sheet for Healthcare Providers: SeriousBroker.it  This test is not yet approved or cleared by the [place] FDA and has been authorized for detection and/or diagnosis of SARS-CoV-2 by FDA under an Emergency Use Authorization (EUA). This EUA will remain in effect (meaning this test can be used) for the duration of the COVID-19 declaration under Section 564(b)(1) of the Act, 21 U.S.C. section 360bbb-3(b)(1), unless the authorization is terminated or revoked.  Performed at [facility], [address] [place], [place] [address]   Blood Culture (routine x 2)     Status: None   Collection Time: 05/12/23 12:34 AM   Specimen: BLOOD RIGHT ARM  Result Value Ref Range Status   Specimen Description BLOOD RIGHT ARM  Final   Special Requests   Final    BOTTLES DRAWN AEROBIC AND ANAEROBIC Blood Culture adequate volume   Culture   Final    NO GROWTH 5 DAYS Performed at [facility], [address], [place], [place] [address]    Report Status 05/17/2023 FINAL  Final  Blood Culture (routine x 2)     Status: None   Collection Time: 05/12/23 12:45 AM   Specimen: BLOOD RIGHT HAND  Result Value Ref Range Status   Specimen Description BLOOD RIGHT HAND  Final   Special Requests   Final    BOTTLES DRAWN AEROBIC AND ANAEROBIC Blood Culture adequate volume   Culture   Final    NO GROWTH 5 DAYS Performed at [facility], [address], [place], [place] [address]    Report Status 05/17/2023 FINAL  Final  MRSA Next Gen by PCR, Nasal     Status: None   Collection Time: 05/12/23  3:45 AM   Specimen: Nasal Mucosa; Nasal Swab  Result Value Ref Range Status   MRSA by PCR Next Gen NOT DETECTED NOT DETECTED Final    Comment: (NOTE) The GeneXpert MRSA Assay (FDA approved for NASAL specimens only), is one component of a comprehensive MRSA colonization surveillance program. It is not intended to diagnose MRSA infection nor to guide or monitor treatment for  MRSA infections. Test performance is not FDA approved in patients less than 45 years old. Performed at [facility], [address], [place], [place] [address]   Culture, Respiratory w Gram Stain     Status: None   Collection Time: 05/12/23  9:10 AM   Specimen: Tracheal  Aspirate; Respiratory  Result Value Ref Range Status   Specimen Description TRACHEAL ASPIRATE  Final   Special Requests NONE  Final   Gram Stain   Final    NO WBC SEEN FEW GRAM POSITIVE COCCI IN CLUSTERS RARE GRAM POSITIVE RODS Performed at [facility], [address], [place], [place] [address]    Culture ABUNDANT STAPHYLOCOCCUS EPIDERMIDIS  Final   Report Status 05/16/2023 FINAL  Final   Organism ID, Bacteria STAPHYLOCOCCUS EPIDERMIDIS  Final      Susceptibility   Staphylococcus epidermidis - MIC*    CIPROFLOXACIN <=0.5 SENSITIVE Sensitive     ERYTHROMYCIN <=0.25 SENSITIVE Sensitive     GENTAMICIN <=0.5 SENSITIVE Sensitive     OXACILLIN >=4 RESISTANT Resistant     TETRACYCLINE >=16 RESISTANT Resistant     VANCOMYCIN 1 SENSITIVE Sensitive     TRIMETH/SULFA <=10 SENSITIVE Sensitive     CLINDAMYCIN <=0.25 SENSITIVE Sensitive     RIFAMPIN <=0.5 SENSITIVE Sensitive     Inducible Clindamycin NEGATIVE Sensitive     * ABUNDANT STAPHYLOCOCCUS EPIDERMIDIS  Body fluid culture w Gram Stain     Status: None   Collection Time: 05/12/23 10:49 AM   Specimen: Pleural Fluid  Result Value Ref Range Status   Specimen Description PLEURAL  Final   Special Requests NONE  Final   Gram Stain NO WBC SEEN NO ORGANISMS SEEN   Final   Culture   Final    NO GROWTH 3 DAYS Performed at [facility], [address], [place], [place] [address]    Report Status 05/15/2023 FINAL  Final     Radiology Studies: VAS [place] LOWER EXTREMITY VENOUS (DVT) Result Date: 05/18/2023  Lower Venous DVT Study Patient Name:  SAATHVIK ARAGONEZ  Date of Exam:   05/17/2023 Medical Rec #: [id number]         Accession #:    [id number] Date  of Birth: 1946/02/05        Patient Gender: M Patient Age:   73 years Exam [facility]  [facility] Procedure:      VAS [place] LOWER EXTREMITY VENOUS (DVT) Referring Phys: A POWELL JR --------------------------------------------------------------------------------  Indications: Edema.  Comparison Study: Previous study on 11.14.2028. Performing Technologist: Fernande Bras  Examination Guidelines: A complete evaluation includes B-mode imaging, spectral Doppler, color Doppler, and power Doppler as needed of all accessible portions of each vessel. Bilateral testing is considered an integral part of a complete examination. Limited examinations for reoccurring indications may be performed as noted. The reflux portion of the exam is performed with the patient in reverse Trendelenburg.  +---------+---------------+---------+-----------+----------+--------------+ RIGHT    CompressibilityPhasicitySpontaneityPropertiesThrombus Aging +---------+---------------+---------+-----------+----------+--------------+ CFV      Full           Yes      Yes                                 +---------+---------------+---------+-----------+----------+--------------+ SFJ      Full           Yes      Yes                                 +---------+---------------+---------+-----------+----------+--------------+ FV Prox  Full                                                        +---------+---------------+---------+-----------+----------+--------------+  FV Mid   Full                                                        +---------+---------------+---------+-----------+----------+--------------+ FV DistalFull                                                        +---------+---------------+---------+-----------+----------+--------------+ PFV      Full                                                        +---------+---------------+---------+-----------+----------+--------------+ POP       Full           Yes      Yes                                 +---------+---------------+---------+-----------+----------+--------------+ PTV      Full                                                        +---------+---------------+---------+-----------+----------+--------------+ PERO     Full                                                        +---------+---------------+---------+-----------+----------+--------------+   +---------+---------------+---------+-----------+----------+-------------------+ LEFT     CompressibilityPhasicitySpontaneityPropertiesThrombus Aging      +---------+---------------+---------+-----------+----------+-------------------+ CFV      Full           Yes      Yes                                      +---------+---------------+---------+-----------+----------+-------------------+ SFJ      Full           Yes      Yes                                      +---------+---------------+---------+-----------+----------+-------------------+ FV Prox  Full                                                             +---------+---------------+---------+-----------+----------+-------------------+ FV Mid   Full                                                             +---------+---------------+---------+-----------+----------+-------------------+  FV DistalFull                                                             +---------+---------------+---------+-----------+----------+-------------------+ PFV      Full                                                             +---------+---------------+---------+-----------+----------+-------------------+ POP      Full           Yes      Yes                                      +---------+---------------+---------+-----------+----------+-------------------+ PTV                                                   Limited, patent by                                                         color.              +---------+---------------+---------+-----------+----------+-------------------+ PERO     Full                                                             +---------+---------------+---------+-----------+----------+-------------------+     Summary: BILATERAL: - No evidence of deep vein thrombosis seen in the lower extremities, bilaterally. -No evidence of popliteal cyst, bilaterally.   *See table(s) above for measurements and observations. Electronically signed by Carolynn Sayers on 05/18/2023 at 10:57:30 AM.    Final    DG CHEST PORT 1 VIEW Result Date: 05/17/2023 CLINICAL DATA:  Cough. EXAM: PORTABLE CHEST 1 VIEW COMPARISON:  May 12, 2023. FINDINGS: Stable cardiomediastinal silhouette. Endotracheal and nasogastric tubes have been removed. Bilateral lung opacities are noted, right greater than left, concerning for pneumonia possibly edema. Small right pleural effusion is noted which is slightly enlarged compared to prior exam. Bony thorax is unremarkable. IMPRESSION: Bilateral lung opacities are noted, right greater than left concerning for pneumonia or possibly edema. Small right pleural effusion is noted which is slightly enlarged compared to prior exam. Electronically Signed   By: Lupita Raider M.D.   On: 05/17/2023 12:26     Scheduled Meds:  aspirin EC  81 mg Oral Daily   atorvastatin  40 mg Oral Daily   Chlorhexidine Gluconate Cloth  6 each Topical Daily   ferrous sulfate  325 mg Oral Q breakfast   furosemide  80 mg Intravenous BID   Gerhardt's butt  cream   Topical BID   heparin  5,000 Units Subcutaneous Q8H   hydrocerin   Topical BID   insulin aspart  0-15 Units Subcutaneous TID WC   insulin aspart  0-5 Units Subcutaneous QHS   isosorbide-hydrALAZINE  2 tablet Oral TID   leptospermum manuka honey  1 Application Topical Daily   traZODone  100 mg Oral QHS   Continuous Infusions:   LOS: 6 days    Time spent:    Zannie Cove,  MD [facility]   05/18/2023, 12:37 PM

## 2023-05-18 NOTE — Progress Notes (Signed)
Pt currently on room air tolerating well at this time with SVS and in no distress. BiPAP order in place, but BiPAP is NIR.

## 2023-05-18 NOTE — Progress Notes (Addendum)
Initial Nutrition Assessment  DOCUMENTATION CODES:   Non-severe (moderate) malnutrition in context of chronic illness  INTERVENTION:  - Ensure Enlive BID between meals - vanilla - Increased finger foods - Liberalize diet -Discussion of importance of intake to progress toward discharge -Meal set up/assistance to prevent difficulty w/ PO intake  NUTRITION DIAGNOSIS:  Moderate Malnutrition related to chronic illness as evidenced by energy intake < 75% for > or equal to 1 month, moderate fat depletion, severe muscle depletion.  GOAL:  Patient will meet greater than or equal to 90% of their needs  MONITOR:  PO intake, Supplement acceptance, Weight trends  REASON FOR ASSESSMENT:  Consult Assessment of nutrition requirement/status  ASSESSMENT:  77 y.o. male previously admitted for SOB and diarrhea. Discharged from hospital on 12/23 to [facility] and returned to hospital 4 hours later c/o respiratory distress and required intubation. PMH: CHF (12/17: Echo EF 40-45%), pulmonary HTN, T2DM, GERD, arthritis, CKD IV, PAD, CAD w/ stent placement 11/18, diabetic foot amputations.   Completed 5 days of cefepime r/t hospital acquired PNA. Endorses BMs are regular. Stating his appetite is poor as he becomes exhausted and has anxiety at the sight of food and the perceived level of exhaustion that comes with consuming a meal.   Ordering Ensure Enlive to supplement poor intake as well as incorporating more finger foods into his diet. He states that staff help him with set up, which is appreciated. Also states that he cannot see out of his R eye, and therefore food should be set up more towards his left side so he can visualize it.   Reiterated importance of intake as it relates to wound healing and preservation of muscle he has remaining. Wounds elevated his resting estimated needs as well.  24-Hour Recall: B: Mandarin oranges, grits, applesauce, English muffin, eggs L: Skips most days D:  greens, sweet potato, no protein  UBW prior to admission was a little over 200 pounds >6 months ago, however wife endorsed he has shown significant fluid gains 2/2 noncompliance with torsemide.   Weight remains stable, however continues with documented/observed edema 2/2 noncompliance with torsemide. Questionable as to whether patient will return to SNF at discharge or discharge home with [facility].   Admission Weight: 103.5kg Current Weight: 102.7 kg  12/23 - Discharged to [facility] 12/23 - Returned to hospital; intubated on propofol 12/24 - Thoracentesis (1.9L removed) 12/25 - Extubated  Labs:  CBGs 98-173 x 5 days BNP 202.7<--455.1 B12 1305 Hgb 7.5<--7.5<--7.1 Hgb A1c 6.4  Meds: darbepoetin alfa, ferrous sulfate, furosemide, insulin SSI, trazadone, fentanyl   NUTRITION - FOCUSED PHYSICAL EXAM:  Flowsheet Row Most Recent Value  Orbital Region No depletion  Upper Arm Region No depletion  Thoracic and Lumbar Region Moderate depletion  Buccal Region Mild depletion  Temple Region No depletion  Clavicle Bone Region Mild depletion  Clavicle and Acromion Bone Region Mild depletion  Scapular Bone Region Mild depletion  Dorsal Hand Severe depletion  Patellar Region --  [L severe, R mild]  Anterior Thigh Region --  [L severe, R mild]  Posterior Calf Region Unable to assess  Edema (RD Assessment) Unable to assess  Hair Reviewed  Eyes Reviewed  Mouth Reviewed  Skin Reviewed  [dry, flaky, see WOC note]  Nails Reviewed      Diet Order:   Diet Order             Diet heart healthy/carb modified Room service appropriate? Yes with Assist; Fluid consistency: Thin  Diet  effective now            EDUCATION NEEDS:   Education needs have been addressed  Skin:  Skin Assessment: Skin Integrity Issues: Skin Integrity Issues:: Stage III Stage III: stage III to sacrum and L heel  Last BM:  12/29  Height:  Ht Readings from Last 1 Encounters:  05/11/23 5\' 11"  (1.803 m)     Weight:  Wt Readings from Last 1 Encounters:  05/18/23 98.9 kg    Ideal Body Weight:  78.2 kg  BMI:  Body mass index is 30.41 kg/m.  Estimated Nutritional Needs:   Kcal:  2100-2300kcal  Protein:  105-115g  Fluid:  >2L   Myrtie Cruise MS, RD, LDN Registered Dietitian Clinical Nutrition RD Inpatient Contact Info in Amion

## 2023-05-19 ENCOUNTER — Inpatient Hospital Stay (HOSPITAL_COMMUNITY): Payer: Medicare Other

## 2023-05-19 DIAGNOSIS — I5043 Acute on chronic combined systolic (congestive) and diastolic (congestive) heart failure: Secondary | ICD-10-CM | POA: Diagnosis not present

## 2023-05-19 LAB — GLUCOSE, CAPILLARY
Glucose-Capillary: 210 mg/dL — ABNORMAL HIGH (ref 70–99)
Glucose-Capillary: 156 mg/dL — ABNORMAL HIGH (ref 70–99)
Glucose-Capillary: 167 mg/dL — ABNORMAL HIGH (ref 70–99)
Glucose-Capillary: 215 mg/dL — ABNORMAL HIGH (ref 70–99)

## 2023-05-19 LAB — CBC
HCT: 22.2 % — ABNORMAL LOW (ref 39.0–52.0)
Hemoglobin: 6.9 g/dL — CL (ref 13.0–17.0)
MCH: 34.2 pg — ABNORMAL HIGH (ref 26.0–34.0)
MCHC: 31.1 g/dL (ref 30.0–36.0)
MCV: 109.9 fL — ABNORMAL HIGH (ref 80.0–100.0)
Platelets: 255 10*3/uL (ref 150–400)
RBC: 2.02 MIL/uL — ABNORMAL LOW (ref 4.22–5.81)
RDW: 14.4 % (ref 11.5–15.5)
WBC: 8.9 10*3/uL (ref 4.0–10.5)
nRBC: 0 % (ref 0.0–0.2)

## 2023-05-19 LAB — BASIC METABOLIC PANEL
Anion gap: 8 (ref 5–15)
BUN: 44 mg/dL — ABNORMAL HIGH (ref 8–23)
CO2: 26 mmol/L (ref 22–32)
Calcium: 8.4 mg/dL — ABNORMAL LOW (ref 8.9–10.3)
Chloride: 106 mmol/L (ref 98–111)
Creatinine, Ser: 2.36 mg/dL — ABNORMAL HIGH (ref 0.61–1.24)
GFR, Estimated: 28 mL/min — ABNORMAL LOW (ref >60)
Glucose, Bld: 217 mg/dL — ABNORMAL HIGH (ref 70–99)
Potassium: 4.2 mmol/L (ref 3.5–5.1)
Sodium: 140 mmol/L (ref 135–145)

## 2023-05-19 LAB — PREPARE RBC (CROSSMATCH)

## 2023-05-19 MED ORDER — HYDROCODONE-ACETAMINOPHEN 5-325 MG PO TABS
1.0000 | ORAL_TABLET | Freq: Four times a day (QID) | ORAL | Status: DC | PRN
  Administered 2023-05-20 – 2023-05-25 (×5): 1 via ORAL
  Filled 2023-05-19 (×6): qty 1

## 2023-05-19 MED ORDER — GUAIFENESIN ER 600 MG PO TB12
600.0000 mg | ORAL_TABLET | Freq: Two times a day (BID) | ORAL | Status: DC
  Administered 2023-05-19 – 2023-05-25 (×13): 600 mg via ORAL
  Filled 2023-05-19 (×14): qty 1

## 2023-05-19 MED ORDER — FUROSEMIDE 10 MG/ML IJ SOLN
40.0000 mg | Freq: Once | INTRAMUSCULAR | Status: AC
  Administered 2023-05-19: 40 mg via INTRAVENOUS
  Filled 2023-05-19: qty 4

## 2023-05-19 MED ORDER — SODIUM CHLORIDE 0.9% IV SOLUTION: Freq: Once | INTRAVENOUS | Status: DC

## 2023-05-19 MED ORDER — IPRATROPIUM-ALBUTEROL 0.5-2.5 (3) MG/3ML IN SOLN: 3.0000 mL | Freq: Four times a day (QID) | RESPIRATORY_TRACT | Status: DC

## 2023-05-19 MED ORDER — IPRATROPIUM-ALBUTEROL 0.5-2.5 (3) MG/3ML IN SOLN
3.0000 mL | Freq: Four times a day (QID) | RESPIRATORY_TRACT | Status: DC
Start: 2023-05-19 — End: 2023-05-22
  Administered 2023-05-19 – 2023-05-21 (×8): 3 mL via RESPIRATORY_TRACT
  Filled 2023-05-19 (×9): qty 3

## 2023-05-19 MED ORDER — ALPRAZOLAM 0.25 MG PO TABS
0.2500 mg | ORAL_TABLET | Freq: Two times a day (BID) | ORAL | Status: DC | PRN
  Administered 2023-05-19 – 2023-05-22 (×3): 0.25 mg via ORAL
  Filled 2023-05-19 (×3): qty 1

## 2023-05-19 NOTE — Progress Notes (Addendum)
 PROGRESS NOTE    David [name]  FMW:[id number] DOB: 31-Jan-1946 DOA: 05/11/2023 PCP: Rexanne Ingle, MD   77 y.o. male patient with a PMHx of CAD (PCI), HTN, dyslipidemia, chronic systolic/diastolic CHF (May 05, 2023: Echo EF 40-45%), pulmonary hypertension (51 mmHg), anemia of chronic illness, DM-2, neuropathy, CKD-4, and possible OSA who was discharged 12/23 for CHF exacerbation, acute hypoxic resp failure, and PNA declined thoracentesis then, who presented from NH after being there for 4 hrs with acute respiratory distress. when EMS arrived, he was saturating in the 60s, and they put him on CPAP and he only improved to the 70s. He failed NRM.  tachycardic, and hypertensive to 140s 150s with tachypnea accessory muscle use and respiratory failure despite CPAP ventilation. He became lethargic, so he was intubated.[name] He quit smoking many yrs ago and he smoked few cigarettes daily.    Significant Events 12/24Given Lasix  80 mg IV in ED 12/24 Intubated on Propofol   12/24 thoracentesis -Complicated by AKI/CKD, slowly improving    Subjective: Complains of congestion, low-grade temp overnight Tmax of 99.4  Assessment and Plan:  Acute Hypoxic Respiratory Failure Due to pneumonia, pleural effusion, and volume overload/pulmonary edema, as below -12/31 >now with URI symptoms, add nebs for pulmonary toilet, Mucinex   Acute chronic combined CHF, RV failure Pulmonary hypertension -Last echo 12/24 with a EF 40-45%, moderately reduced RV, moderately elevated PA systolic pressures -Remains volume overloaded and hypoxic -Restarted IV Lasix , 9.2 L negative, weight down 19 LB, creatinine improving as well, continue Lasix , bidil  O2 -GDMT limited by CKD 4 -PT OT following, SNF recommended   Hospital Acquired Pneumonia -Completed 5 days of cefepime  -Respiratory culture with Staph epidermidis, likely contaminant Blood cx NGTD x3   Transudative Pleural Effusion   1.9 L removed on 12/24 Cytology  without malignant cells, cx with Ngx3 Follow intermittent CXR   AKI on CKD IV Baseline creatinine around 2.5-3 Improving, Lasix  as above  Acute on chronic anemia -Hemoglobin has been in the 7.1 range last few days, anemia panel suggestive of chronic disease, Epogen given 12/30 -Hemoglobin 6.9 today, no overt bleeding noted, transfuse 1 unit PRBC   Coronary Artery Disease Aspirin , statin   PAD History of multiple toe amputations, follow-up with vascular surgery -Continue aspirin  and statin  Type 2 Diabetes Stable, SSI   Peripheral Neuropathy   Suspected OSA Needs outpatient sleep study    DVT prophylaxis: heparin  Code Status: full Family Communication: none Disposition Plan: Likely SNF, 48 hours  Consultants:    Procedures:   Antimicrobials:    Objective: Vitals:   05/19/23 0515 05/19/23 0527 05/19/23 0824 05/19/23 0828  BP: (!) 143/74 (!) 142/71  (!) 143/81  Pulse: 70 69  71  Resp: 18 16  19   Temp: 99.4 F (37.4 C) 99.4 F (37.4 C) 99.3 F (37.4 C) 99.3 F (37.4 C)  TempSrc: Oral Oral Oral Oral  SpO2: 100% 100%  99%  Weight:      Height:        Intake/Output Summary (Last 24 hours) at 05/19/2023 1217 Last data filed at 05/19/2023 1000 Gross per 24 hour  Intake 741.67 ml  Output 4600 ml  Net -3858.33 ml   Filed Weights   05/16/23 0405 05/18/23 0700 05/19/23 0508  Weight: 102.7 kg 98.9 kg 95.1 kg    Examination:  General exam: Chronically ill male laying in bed, AAOx3 HEENT: Positive JVD CVS: S1-S2, regular rhythm Lungs: Improved air movement, few scattered rhonchi and basilar Rales  Abd: nondistended, soft and  nontender.Normal bowel sounds heard. Extremities: Chronic ischemic skin changes in both feet, amputation of multiple toes Skin: As above  Data Reviewed:   CBC: Recent Labs  Lab 05/15/23 0820 05/16/23 0229 05/17/23 1449 05/18/23 0246 05/19/23 0304  WBC 10.6* 8.9 8.6 9.8 8.9  NEUTROABS  --  7.8* 6.7  --   --   HGB 7.1* 7.1*  7.5* 7.5* 6.9*  HCT 22.5* 22.6* 24.0* 24.0* 22.2*  MCV 109.8* 110.2* 108.6* 108.6* 109.9*  PLT 248 256 297 264 255   Basic Metabolic Panel: Recent Labs  Lab 05/13/23 0236 05/14/23 0303 05/15/23 0820 05/16/23 0229 05/17/23 1449 05/18/23 0246 05/19/23 0304  NA 138 139 140 140 138 139 140  K 4.5 4.6 4.4 4.6 4.6 4.5 4.2  CL 106 108 108 108 106 108 106  CO2 21* 20* 20* 22 23 21* 26  GLUCOSE 119* 98 119* 155* 173* 151* 217*  BUN 57* 57* 62* 59* 52* 48* 44*  CREATININE 2.58* 2.61* 3.17* 2.86* 2.43* 2.32* 2.36*  CALCIUM  8.5* 8.6* 8.6* 8.7* 8.4* 8.6* 8.4*  MG 2.5* 2.6* 2.8* 3.0*  --   --   --   PHOS  --   --   --  4.4  --   --   --    GFR: Estimated Creatinine Clearance: 31.3 mL/min (A) (by C-G formula based on SCr of 2.36 mg/dL (H)). Liver Function Tests: Recent Labs  Lab 05/16/23 0229 05/17/23 1449  AST 22 30  ALT 18 21  ALKPHOS 202* 222*  BILITOT 0.7 0.6  PROT 6.9 7.4  ALBUMIN  2.3* 2.4*   No results for input(s): LIPASE, AMYLASE in the last 168 hours. No results for input(s): AMMONIA in the last 168 hours. Coagulation Profile: No results for input(s): INR, PROTIME in the last 168 hours.  Cardiac Enzymes: No results for input(s): CKTOTAL, CKMB, CKMBINDEX, TROPONINI in the last 168 hours. BNP (last 3 results) No results for input(s): PROBNP in the last 8760 hours. HbA1C: No results for input(s): HGBA1C in the last 72 hours. CBG: Recent Labs  Lab 05/18/23 0646 05/18/23 1121 05/18/23 1524 05/18/23 2126 05/19/23 0604  GLUCAP 160* 180* 187* 164* 210*   Lipid Profile: No results for input(s): CHOL, HDL, LDLCALC, TRIG, CHOLHDL, LDLDIRECT in the last 72 hours. Thyroid  Function Tests: No results for input(s): TSH, T4TOTAL, FREET4, T3FREE, THYROIDAB in the last 72 hours. Anemia Panel: Recent Labs    05/17/23 1449  VITAMINB12 1,305*  FOLATE 21.4  FERRITIN 327  TIBC 188*  IRON  43*  RETICCTPCT 3.4*   Urine analysis:     Component Value Date/Time   COLORURINE YELLOW 05/12/2023 0022   APPEARANCEUR HAZY (A) 05/12/2023 0022   LABSPEC 1.017 05/12/2023 0022   PHURINE 5.0 05/12/2023 0022   GLUCOSEU NEGATIVE 05/12/2023 0022   HGBUR SMALL (A) 05/12/2023 0022   BILIRUBINUR NEGATIVE 05/12/2023 0022   KETONESUR NEGATIVE 05/12/2023 0022   PROTEINUR 100 (A) 05/12/2023 0022   UROBILINOGEN 1.0 02/13/2015 1934   NITRITE NEGATIVE 05/12/2023 0022   LEUKOCYTESUR NEGATIVE 05/12/2023 0022   Sepsis Labs: @LABRCNTIP (procalcitonin:4,lacticidven:4)  ) Recent Results (from the past 240 hours)  Resp panel by RT-PCR (RSV, Flu A&B, Covid) Anterior Nasal Swab     Status: None   Collection Time: 05/12/23 12:21 AM   Specimen: Anterior Nasal Swab  Result Value Ref Range Status   SARS Coronavirus 2 by RT PCR NEGATIVE NEGATIVE Final   Influenza A by PCR NEGATIVE NEGATIVE Final   Influenza B by PCR NEGATIVE NEGATIVE  Final    Comment: (NOTE) The Xpert Xpress SARS-CoV-2/FLU/RSV plus assay is intended as an aid in the diagnosis of influenza from Nasopharyngeal swab specimens and should not be used as a sole basis for treatment. Nasal washings and aspirates are unacceptable for Xpert Xpress SARS-CoV-2/FLU/RSV testing.  Fact Sheet for Patients: bloggercourse.com  Fact Sheet for Healthcare Providers: seriousbroker.it  This test is not yet approved or cleared by the [place]  FDA and has been authorized for detection and/or diagnosis of SARS-CoV-2 by FDA under an Emergency Use Authorization (EUA). This EUA will remain in effect (meaning this test can be used) for the duration of the COVID-19 declaration under Section 564(b)(1) of the Act, 21 U.S.C. section 360bbb-3(b)(1), unless the authorization is terminated or revoked.     Resp Syncytial Virus by PCR NEGATIVE NEGATIVE Final    Comment: (NOTE) Fact Sheet for Patients: bloggercourse.com  Fact  Sheet for Healthcare Providers: seriousbroker.it  This test is not yet approved or cleared by the [place]  FDA and has been authorized for detection and/or diagnosis of SARS-CoV-2 by FDA under an Emergency Use Authorization (EUA). This EUA will remain in effect (meaning this test can be used) for the duration of the COVID-19 declaration under Section 564(b)(1) of the Act, 21 U.S.C. section 360bbb-3(b)(1), unless the authorization is terminated or revoked.  Performed at [facility], [address], [place], [place] [address]   Blood Culture (routine x 2)     Status: None   Collection Time: 05/12/23 12:34 AM   Specimen: BLOOD RIGHT ARM  Result Value Ref Range Status   Specimen Description BLOOD RIGHT ARM  Final   Special Requests   Final    BOTTLES DRAWN AEROBIC AND ANAEROBIC Blood Culture adequate volume   Culture   Final    NO GROWTH 5 DAYS Performed at [facility], [address] [place], [place] [address]    Report Status 05/17/2023 FINAL  Final  Blood Culture (routine x 2)     Status: None   Collection Time: 05/12/23 12:45 AM   Specimen: BLOOD RIGHT HAND  Result Value Ref Range Status   Specimen Description BLOOD RIGHT HAND  Final   Special Requests   Final    BOTTLES DRAWN AEROBIC AND ANAEROBIC Blood Culture adequate volume   Culture   Final    NO GROWTH 5 DAYS Performed at [facility], [address] [place], [place] [address]    Report Status 05/17/2023 FINAL  Final  MRSA Next Gen by PCR, Nasal     Status: None   Collection Time: 05/12/23  3:45 AM   Specimen: Nasal Mucosa; Nasal Swab  Result Value Ref Range Status   MRSA by PCR Next Gen NOT DETECTED NOT DETECTED Final    Comment: (NOTE) The GeneXpert MRSA Assay (FDA approved for NASAL specimens only), is one component of a comprehensive MRSA colonization surveillance program. It is not intended to diagnose MRSA infection nor to guide or monitor treatment for  MRSA infections. Test performance is not FDA approved in patients less than 74 years old. Performed at [facility], [address], [place], [place] [address]   Culture, Respiratory w Gram Stain     Status: None   Collection Time: 05/12/23  9:10 AM   Specimen: Tracheal Aspirate; Respiratory  Result Value Ref Range Status   Specimen Description TRACHEAL ASPIRATE  Final   Special Requests NONE  Final   Gram Stain   Final  NO WBC SEEN FEW GRAM POSITIVE COCCI IN CLUSTERS RARE GRAM POSITIVE RODS Performed at [facility], [address], [place], [place] [address]    Culture ABUNDANT STAPHYLOCOCCUS EPIDERMIDIS  Final   Report Status 05/16/2023 FINAL  Final   Organism ID, Bacteria STAPHYLOCOCCUS EPIDERMIDIS  Final      Susceptibility   Staphylococcus epidermidis - MIC*    CIPROFLOXACIN  <=0.5 SENSITIVE Sensitive     ERYTHROMYCIN <=0.25 SENSITIVE Sensitive     GENTAMICIN <=0.5 SENSITIVE Sensitive     OXACILLIN >=4 RESISTANT Resistant     TETRACYCLINE >=16 RESISTANT Resistant     VANCOMYCIN  1 SENSITIVE Sensitive     TRIMETH /SULFA  <=10 SENSITIVE Sensitive     CLINDAMYCIN <=0.25 SENSITIVE Sensitive     RIFAMPIN <=0.5 SENSITIVE Sensitive     Inducible Clindamycin NEGATIVE Sensitive     * ABUNDANT STAPHYLOCOCCUS EPIDERMIDIS  Body fluid culture w Gram Stain     Status: None   Collection Time: 05/12/23 10:49 AM   Specimen: Pleural Fluid  Result Value Ref Range Status   Specimen Description PLEURAL  Final   Special Requests NONE  Final   Gram Stain NO WBC SEEN NO ORGANISMS SEEN   Final   Culture   Final    NO GROWTH 3 DAYS Performed at [facility], [address], [place], [place] [address]    Report Status 05/15/2023 FINAL  Final     Radiology Studies: DG CHEST PORT 1 VIEW Result Date: 05/19/2023 CLINICAL DATA:  Cough for 3 days.  Shortness of breath EXAM: PORTABLE CHEST 1 VIEW COMPARISON:  05/17/2023 and older FINDINGS: Stable cardiopericardial silhouette.  Calcified aorta. Overlapping cardiac leads. No pneumothorax. Moderate right effusion and tiny left, similar to previous. Adjacent lung opacities also noted. No edema. Degenerative changes along the spine. IMPRESSION: No significant interval change when adjusting for technique. Electronically Signed   By: Ranell Bring M.D.   On: 05/19/2023 11:39   VAS US  LOWER EXTREMITY VENOUS (DVT) Result Date: 05/18/2023  Lower Venous DVT Study Patient Name:  David [name]  Date of Exam:   05/17/2023 Medical Rec #: [id number]         Accession #:    [id number] Date of Birth: 01/25/1946        Patient Gender: M Patient Age:   57 years Exam Location:  [facility] Procedure:      VAS US  LOWER EXTREMITY VENOUS (DVT) Referring Phys: A POWELL JR --------------------------------------------------------------------------------  Indications: Edema.  Comparison Study: Previous study on 11.14.2028. Performing Technologist: Edilia Elden Appl  Examination Guidelines: A complete evaluation includes B-mode imaging, spectral Doppler, color Doppler, and power Doppler as needed of all accessible portions of each vessel. Bilateral testing is considered an integral part of a complete examination. Limited examinations for reoccurring indications may be performed as noted. The reflux portion of the exam is performed with the patient in reverse Trendelenburg.  +---------+---------------+---------+-----------+----------+--------------+ RIGHT    CompressibilityPhasicitySpontaneityPropertiesThrombus Aging +---------+---------------+---------+-----------+----------+--------------+ CFV      Full           Yes      Yes                                 +---------+---------------+---------+-----------+----------+--------------+ SFJ      Full           Yes      Yes                                 +---------+---------------+---------+-----------+----------+--------------+  FV Prox  Full                                                         +---------+---------------+---------+-----------+----------+--------------+ FV Mid   Full                                                        +---------+---------------+---------+-----------+----------+--------------+ FV DistalFull                                                        +---------+---------------+---------+-----------+----------+--------------+ PFV      Full                                                        +---------+---------------+---------+-----------+----------+--------------+ POP      Full           Yes      Yes                                 +---------+---------------+---------+-----------+----------+--------------+ PTV      Full                                                        +---------+---------------+---------+-----------+----------+--------------+ PERO     Full                                                        +---------+---------------+---------+-----------+----------+--------------+   +---------+---------------+---------+-----------+----------+-------------------+ LEFT     CompressibilityPhasicitySpontaneityPropertiesThrombus Aging      +---------+---------------+---------+-----------+----------+-------------------+ CFV      Full           Yes      Yes                                      +---------+---------------+---------+-----------+----------+-------------------+ SFJ      Full           Yes      Yes                                      +---------+---------------+---------+-----------+----------+-------------------+ FV Prox  Full                                                             +---------+---------------+---------+-----------+----------+-------------------+  FV Mid   Full                                                             +---------+---------------+---------+-----------+----------+-------------------+ FV DistalFull                                                              +---------+---------------+---------+-----------+----------+-------------------+ PFV      Full                                                             +---------+---------------+---------+-----------+----------+-------------------+ POP      Full           Yes      Yes                                      +---------+---------------+---------+-----------+----------+-------------------+ PTV                                                   Limited, patent by                                                        color.              +---------+---------------+---------+-----------+----------+-------------------+ PERO     Full                                                             +---------+---------------+---------+-----------+----------+-------------------+     Summary: BILATERAL: - No evidence of deep vein thrombosis seen in the lower extremities, bilaterally. -No evidence of popliteal cyst, bilaterally.   *See table(s) above for measurements and observations. Electronically signed by Norman Serve on 05/18/2023 at 10:57:30 AM.    Final      Scheduled Meds:  sodium chloride    Intravenous Once   aspirin  EC  81 mg Oral Daily   atorvastatin   40 mg Oral Daily   Chlorhexidine  Gluconate Cloth  6 each Topical Daily   feeding supplement  237 mL Oral BID BM   ferrous sulfate   325 mg Oral Q breakfast   furosemide   80 mg Intravenous BID   Gerhardt's butt cream   Topical BID   heparin   5,000 Units Subcutaneous Q8H   hydrocerin   Topical BID   insulin  aspart  0-15 Units Subcutaneous  TID WC   insulin  aspart  0-5 Units Subcutaneous QHS   insulin  glargine-yfgn  8 Units Subcutaneous Daily   isosorbide -hydrALAZINE   2 tablet Oral TID   leptospermum manuka honey  1 Application Topical Daily   traZODone   100 mg Oral QHS   Continuous Infusions:   LOS: 7 days    Time spent:    Sigurd Pac, MD [facility]   05/19/2023, 12:17 PM

## 2023-05-19 NOTE — Progress Notes (Signed)
 Orthopedic Tech Progress Note Patient Details:  David [name] [birth year] [id number]  Clicked on wrong patient   Patient ID: David [name], male   DOB: 08-10-1945, 77 y.o.   MRN: [id number]  David [name] 05/19/2023, 12:40 PM

## 2023-05-19 NOTE — TOC Progression Note (Signed)
 Transition of Care [facility]) - Progression Note    Patient Details  Name: David [name] MRN: [id number] Date of Birth: [birth year]  [facility]) CM/SW Contact  Luise JAYSON Pan, [place] Phone Number: 05/19/2023, 4:29 PM  Clinical Narrative:   CSW spoke with pt and wife to discuss plan for discharge (SNF vs. HH). Pt declined SNF and states he wants to go home with HHPT/OT/SN at this time. Wife agrees to pt coming home as pt states my mind is made up I do not want to go to another facility. CSW notified treatment team at this time.          Expected Discharge Plan and Services                                               Social Determinants of Health (SDOH) Interventions SDOH Screenings   Food Insecurity: No Food Insecurity (05/14/2023)  Housing: Low Risk  (05/18/2023)  Transportation Needs: No Transportation Needs (05/18/2023)  Recent Concern: Transportation Needs - Unmet Transportation Needs (05/05/2023)  Utilities: Not At Risk (05/14/2023)  Alcohol Screen: Low Risk  (05/18/2023)  Financial Resource Strain: Low Risk  (05/18/2023)  Physical Activity: Inactive (08/20/2017)  Social Connections: Socially Integrated (05/19/2023)  Stress: No Stress Concern Present (08/20/2017)  Tobacco Use: Medium Risk (05/14/2023)    Readmission Risk Interventions     No data to display

## 2023-05-19 NOTE — Progress Notes (Signed)
   05/19/23 0000  BiPAP/CPAP/SIPAP  Reason BIPAP/CPAP not in use Non-compliant (pt refused Cpap for the night)

## 2023-05-19 NOTE — Progress Notes (Signed)
   05/19/23 2300  BiPAP/CPAP/SIPAP  Reason BIPAP/CPAP not in use Non-compliant (pt refused)

## 2023-05-19 NOTE — Progress Notes (Signed)
   Heart Failure Stewardship Pharmacist Progress Note   PCP: Rexanne Ingle, MD PCP-Cardiologist: None    HPI:  77 yo M with PMH of CAD, CHF, HLD, pulmonary HTN, anemia, T2DM, neuropathy, CKD IV, and OSA.   Admitted from 12/16-12/23 with CHF exacerbation, CAP, and COPD. ECHO 12/17 showed LVEF 40-45%, mild concentric LVH, RV moderately reduced. He was discharged to SNF but had respiratory distress and came back to the ED. He was then intubated in the ED. S/p thoracentesis on 12/24 removing 1900 cc's of fluid from the right pleural space. Extubated on 12/25.   States that he is still feeling congested. Just took a dose of mucinex . Feels like he should still be on antibiotics. He completed a 5 day course of cefepime  on 12/28.   Current HF Medications: Diuretic: furosemide  80 mg IV BID  Other: BiDil  20/37.5 mg 2 tabs TID  Prior to admission HF Medications: Diuretic: furosemide  40 mg daily Beta blocker: metoprolol  XL 100 mg daily Other: BiDil  20/37.5 mg 2 tabs TID  Pertinent Lab Values: Serum creatinine 2.36, BUN 44, Potassium 4.2, Sodium 140, Magnesium  3.0, A1c 6.4, BNP 202.7  Vital Signs: Weight: 209 lbs (admission weight: 228 lbs) Blood pressure: 130/80s  Heart rate: 60s  I/O: net -3.5L yesterday; net -8.5L since admission  Medication Assistance / Insurance Benefits Check: Does the patient have prescription insurance?  Yes Type of insurance plan: [employer] [facility] [employer]  Outpatient Pharmacy:  Prior to admission outpatient pharmacy: Pill pack by [facility] Is the patient willing to use [facility] TOC pharmacy at discharge? Yes Is the patient willing to transition their outpatient pharmacy to utilize a [facility] outpatient pharmacy?   No    Assessment: 1. Acute on chronic systolic CHF (LVEF 40-45%). NYHA class III symptoms. - Continue furosemide  80 mg IV BID. Strict I/Os and daily weights. Keep K>4 and Mg>2. - Holding PTA metoprolol  XL with bradycardia. HR in the 60s today. - Continue BiDil   20/37.5 mg 2 tabs TID - GDMT limited by advanced CKD   Plan: 1) Medication changes recommended at this time: - None  2) Patient assistance: - None pending  3)  Education  - Patient has been educated on current HF medications and potential additions to HF medication regimen - Patient verbalizes understanding that over the next few months, these medication doses may change and more medications may be added to optimize HF regimen - Patient has been educated on basic disease state pathophysiology and goals of therapy   Duwaine Plant, PharmD, BCPS Heart Failure Stewardship Pharmacist Phone [phone]

## 2023-05-20 DIAGNOSIS — I5043 Acute on chronic combined systolic (congestive) and diastolic (congestive) heart failure: Secondary | ICD-10-CM | POA: Diagnosis not present

## 2023-05-20 LAB — GLUCOSE, CAPILLARY
Glucose-Capillary: 213 mg/dL — ABNORMAL HIGH (ref 70–99)
Glucose-Capillary: 187 mg/dL — ABNORMAL HIGH (ref 70–99)
Glucose-Capillary: 216 mg/dL — ABNORMAL HIGH (ref 70–99)
Glucose-Capillary: 157 mg/dL — ABNORMAL HIGH (ref 70–99)

## 2023-05-20 LAB — TYPE AND SCREEN
ABO/RH(D): O POS
Antibody Screen: NEGATIVE
Unit division: 0

## 2023-05-20 LAB — BASIC METABOLIC PANEL
Anion gap: 8 (ref 5–15)
BUN: 45 mg/dL — ABNORMAL HIGH (ref 8–23)
CO2: 28 mmol/L (ref 22–32)
Calcium: 8.3 mg/dL — ABNORMAL LOW (ref 8.9–10.3)
Chloride: 101 mmol/L (ref 98–111)
Creatinine, Ser: 2.41 mg/dL — ABNORMAL HIGH (ref 0.61–1.24)
GFR, Estimated: 27 mL/min — ABNORMAL LOW (ref >60)
Glucose, Bld: 265 mg/dL — ABNORMAL HIGH (ref 70–99)
Potassium: 3.9 mmol/L (ref 3.5–5.1)
Sodium: 137 mmol/L (ref 135–145)

## 2023-05-20 LAB — CBC
HCT: 24.2 % — ABNORMAL LOW (ref 39.0–52.0)
Hemoglobin: 7.5 g/dL — ABNORMAL LOW (ref 13.0–17.0)
MCH: 33.3 pg (ref 26.0–34.0)
MCHC: 31 g/dL (ref 30.0–36.0)
MCV: 107.6 fL — ABNORMAL HIGH (ref 80.0–100.0)
Platelets: 289 10*3/uL (ref 150–400)
RBC: 2.25 MIL/uL — ABNORMAL LOW (ref 4.22–5.81)
RDW: 16 % — ABNORMAL HIGH (ref 11.5–15.5)
WBC: 10.6 10*3/uL — ABNORMAL HIGH (ref 4.0–10.5)
nRBC: 0 % (ref 0.0–0.2)

## 2023-05-20 LAB — BPAM RBC
Blood Product Expiration Date: 202501232359
ISSUE DATE / TIME: 202412310506
Unit Type and Rh: 5100

## 2023-05-20 MED ORDER — INSULIN GLARGINE-YFGN 100 UNIT/ML ~~LOC~~ SOLN
15.0000 [IU] | Freq: Every day | SUBCUTANEOUS | Status: DC
  Administered 2023-05-21 – 2023-05-25 (×5): 15 [IU] via SUBCUTANEOUS
  Filled 2023-05-20 (×5): qty 0.15

## 2023-05-20 NOTE — Progress Notes (Signed)
 PROGRESS NOTE    David [name]  FMW:[id number] DOB: [birth year] DOA: 05/11/2023 PCP: Rexanne Ingle, MD   78 y.o. male patient with a PMHx of CAD (PCI), HTN, dyslipidemia, chronic systolic/diastolic CHF (May 05, 2023: Echo EF 40-45%), pulmonary hypertension (51 mmHg), anemia of chronic illness, DM-2, neuropathy, CKD-4, and possible OSA who was discharged 12/23 for CHF exacerbation, acute hypoxic resp failure, and PNA declined thoracentesis then, who presented from NH after being there for 4 hrs with acute respiratory distress. when EMS arrived, he was saturating in the 60s, and they put him on CPAP and he only improved to the 70s. He failed NRM.  tachycardic, and hypertensive to 140s 150s with tachypnea accessory muscle use and respiratory failure despite CPAP ventilation. He became lethargic, so he was intubated.[name] He quit smoking many yrs ago and he smoked few cigarettes daily.    Significant Events 12/24Given Lasix  80 mg IV in ED 12/24 Intubated on Propofol   12/24 thoracentesis -Complicated by AKI/CKD, slowly improving    Subjective: Feels okay overall, breathing is improving, adamant about not going to SNF  Assessment and Plan:  Acute Hypoxic Respiratory Failure Due to pneumonia, pleural effusion, and volume overload/pulmonary edema, as below -12/31 >now with URI symptoms, nebs, Mucinex   Acute chronic combined CHF, RV failure Pulmonary hypertension -Last echo 12/24 with a EF 40-45%, moderately reduced RV, moderately elevated PA systolic pressures -Volume overloaded on admission, hypoxic, slowly improving -Continue IV Lasix  1 more day, he is 11.3 L negative, weight down 25 LB, continue BiDil  -GDMT limited by CKD 4 -PT OT following, SNF recommended> patient adamantly declines this, will discharge home with home health services, high risk of readmission   Hospital Acquired Pneumonia -Completed 5 days of cefepime  -Respiratory culture with Staph epidermidis, likely  contaminant Blood cx NGTD x3   Transudative Pleural Effusion   1.9 L removed on 12/24 Cytology without malignant cells, cx with Ngx3 -Improved on repeat x-rays   AKI on CKD IV Baseline creatinine around 2.5-3 Improving, Lasix  as above  Acute on chronic anemia -Hemoglobin has been in the 7.1 range last few days, anemia panel suggestive of chronic disease, Epogen given 12/30 -hb was 6.9 on 12/31, transfused 1 unit of PRBC, anemia panel suggestive of chronic disease, given EPO X1, will repeat tomorrow   Coronary Artery Disease Aspirin , statin   PAD History of multiple toe amputations, follow-up with vascular surgery -Continue aspirin  and statin - previously declined angiogram as recommended by vascular  Type 2 Diabetes Stable, SSI   Peripheral Neuropathy   Suspected OSA Needs outpatient sleep study    DVT prophylaxis: heparin  Code Status: full Family Communication: none Disposition Plan: Home likely 48 hours  Consultants:    Procedures:   Antimicrobials:    Objective: Vitals:   05/20/23 0452 05/20/23 0603 05/20/23 0804 05/20/23 0823  BP: (!) 149/75   (!) 141/76  Pulse: 80   80  Resp: 17   17  Temp: 98.9 F (37.2 C)   99.1 F (37.3 C)  TempSrc: Oral   Oral  SpO2: 98%  96% 97%  Weight:  92.8 kg    Height:        Intake/Output Summary (Last 24 hours) at 05/20/2023 1059 Last data filed at 05/20/2023 0923 Gross per 24 hour  Intake 480 ml  Output 2850 ml  Net -2370 ml   Filed Weights   05/18/23 0700 05/19/23 0508 05/20/23 0603  Weight: 98.9 kg 95.1 kg 92.8 kg    Examination:  General exam:  Chronically ill male AAOx3 HEENT: Positive JVD CVS: S1-S2, regular rhythm Lungs: Improved air movement, few scattered rhonchi and basilar Rales Abd: nondistended, soft and nontender.Normal bowel sounds heard. Extremities: Chronic ischemic skin changes in both feet, amputation of multiple toes Skin: As above  Data Reviewed:   CBC: Recent Labs  Lab  05/16/23 0229 05/17/23 1449 05/18/23 0246 05/19/23 0304 05/20/23 0251  WBC 8.9 8.6 9.8 8.9 10.6*  NEUTROABS 7.8* 6.7  --   --   --   HGB 7.1* 7.5* 7.5* 6.9* 7.5*  HCT 22.6* 24.0* 24.0* 22.2* 24.2*  MCV 110.2* 108.6* 108.6* 109.9* 107.6*  PLT 256 297 264 255 289   Basic Metabolic Panel: Recent Labs  Lab 05/14/23 0303 05/15/23 0820 05/16/23 0229 05/17/23 1449 05/18/23 0246 05/19/23 0304 05/20/23 0251  NA 139 140 140 138 139 140 137  K 4.6 4.4 4.6 4.6 4.5 4.2 3.9  CL 108 108 108 106 108 106 101  CO2 20* 20* 22 23 21* 26 28  GLUCOSE 98 119* 155* 173* 151* 217* 265*  BUN 57* 62* 59* 52* 48* 44* 45*  CREATININE 2.61* 3.17* 2.86* 2.43* 2.32* 2.36* 2.41*  CALCIUM  8.6* 8.6* 8.7* 8.4* 8.6* 8.4* 8.3*  MG 2.6* 2.8* 3.0*  --   --   --   --   PHOS  --   --  4.4  --   --   --   --    GFR: Estimated Creatinine Clearance: 30.4 mL/min (A) (by C-G formula based on SCr of 2.41 mg/dL (H)). Liver Function Tests: Recent Labs  Lab 05/16/23 0229 05/17/23 1449  AST 22 30  ALT 18 21  ALKPHOS 202* 222*  BILITOT 0.7 0.6  PROT 6.9 7.4  ALBUMIN  2.3* 2.4*   No results for input(s): LIPASE, AMYLASE in the last 168 hours. No results for input(s): AMMONIA in the last 168 hours. Coagulation Profile: No results for input(s): INR, PROTIME in the last 168 hours.  Cardiac Enzymes: No results for input(s): CKTOTAL, CKMB, CKMBINDEX, TROPONINI in the last 168 hours. BNP (last 3 results) No results for input(s): PROBNP in the last 8760 hours. HbA1C: No results for input(s): HGBA1C in the last 72 hours. CBG: Recent Labs  Lab 05/19/23 0604 05/19/23 1214 05/19/23 1820 05/19/23 2123 05/20/23 0603  GLUCAP 210* 156* 167* 215* 213*   Lipid Profile: No results for input(s): CHOL, HDL, LDLCALC, TRIG, CHOLHDL, LDLDIRECT in the last 72 hours. Thyroid  Function Tests: No results for input(s): TSH, T4TOTAL, FREET4, T3FREE, THYROIDAB in the last 72  hours. Anemia Panel: Recent Labs    05/17/23 1449  VITAMINB12 1,305*  FOLATE 21.4  FERRITIN 327  TIBC 188*  IRON  43*  RETICCTPCT 3.4*   Urine analysis:    Component Value Date/Time   COLORURINE YELLOW 05/12/2023 0022   APPEARANCEUR HAZY (A) 05/12/2023 0022   LABSPEC 1.017 05/12/2023 0022   PHURINE 5.0 05/12/2023 0022   GLUCOSEU NEGATIVE 05/12/2023 0022   HGBUR SMALL (A) 05/12/2023 0022   BILIRUBINUR NEGATIVE 05/12/2023 0022   KETONESUR NEGATIVE 05/12/2023 0022   PROTEINUR 100 (A) 05/12/2023 0022   UROBILINOGEN 1.0 02/13/2015 1934   NITRITE NEGATIVE 05/12/2023 0022   LEUKOCYTESUR NEGATIVE 05/12/2023 0022   Sepsis Labs: @LABRCNTIP (procalcitonin:4,lacticidven:4)  ) Recent Results (from the past 240 hours)  Resp panel by RT-PCR (RSV, Flu A&B, Covid) Anterior Nasal Swab     Status: None   Collection Time: 05/12/23 12:21 AM   Specimen: Anterior Nasal Swab  Result Value Ref Range Status  SARS Coronavirus 2 by RT PCR NEGATIVE NEGATIVE Final   Influenza A by PCR NEGATIVE NEGATIVE Final   Influenza B by PCR NEGATIVE NEGATIVE Final    Comment: (NOTE) The Xpert Xpress SARS-CoV-2/FLU/RSV plus assay is intended as an aid in the diagnosis of influenza from Nasopharyngeal swab specimens and should not be used as a sole basis for treatment. Nasal washings and aspirates are unacceptable for Xpert Xpress SARS-CoV-2/FLU/RSV testing.  Fact Sheet for Patients: bloggercourse.com  Fact Sheet for Healthcare Providers: seriousbroker.it  This test is not yet approved or cleared by the [place]  FDA and has been authorized for detection and/or diagnosis of SARS-CoV-2 by FDA under an Emergency Use Authorization (EUA). This EUA will remain in effect (meaning this test can be used) for the duration of the COVID-19 declaration under Section 564(b)(1) of the Act, 21 U.S.C. section 360bbb-3(b)(1), unless the authorization is terminated  or revoked.     Resp Syncytial Virus by PCR NEGATIVE NEGATIVE Final    Comment: (NOTE) Fact Sheet for Patients: bloggercourse.com  Fact Sheet for Healthcare Providers: seriousbroker.it  This test is not yet approved or cleared by the [place]  FDA and has been authorized for detection and/or diagnosis of SARS-CoV-2 by FDA under an Emergency Use Authorization (EUA). This EUA will remain in effect (meaning this test can be used) for the duration of the COVID-19 declaration under Section 564(b)(1) of the Act, 21 U.S.C. section 360bbb-3(b)(1), unless the authorization is terminated or revoked.  Performed at [facility], [address] [place], [place] [address]   Blood Culture (routine x 2)     Status: None   Collection Time: 05/12/23 12:34 AM   Specimen: BLOOD RIGHT ARM  Result Value Ref Range Status   Specimen Description BLOOD RIGHT ARM  Final   Special Requests   Final    BOTTLES DRAWN AEROBIC AND ANAEROBIC Blood Culture adequate volume   Culture   Final    NO GROWTH 5 DAYS Performed at [facility], [address], [place], [place] [address]    Report Status 05/17/2023 FINAL  Final  Blood Culture (routine x 2)     Status: None   Collection Time: 05/12/23 12:45 AM   Specimen: BLOOD RIGHT HAND  Result Value Ref Range Status   Specimen Description BLOOD RIGHT HAND  Final   Special Requests   Final    BOTTLES DRAWN AEROBIC AND ANAEROBIC Blood Culture adequate volume   Culture   Final    NO GROWTH 5 DAYS Performed at [facility], [address] [place], [place] [address]    Report Status 05/17/2023 FINAL  Final  MRSA Next Gen by PCR, Nasal     Status: None   Collection Time: 05/12/23  3:45 AM   Specimen: Nasal Mucosa; Nasal Swab  Result Value Ref Range Status   MRSA by PCR Next Gen NOT DETECTED NOT DETECTED Final    Comment: (NOTE) The GeneXpert MRSA Assay (FDA approved for NASAL specimens  only), is one component of a comprehensive MRSA colonization surveillance program. It is not intended to diagnose MRSA infection nor to guide or monitor treatment for MRSA infections. Test performance is not FDA approved in patients less than 23 years old. Performed at [facility], [address] [place], [place] [address]   Culture, Respiratory w Gram Stain     Status: None   Collection Time: 05/12/23  9:10 AM   Specimen: Tracheal Aspirate; Respiratory  Result  Value Ref Range Status   Specimen Description TRACHEAL ASPIRATE  Final   Special Requests NONE  Final   Gram Stain   Final    NO WBC SEEN FEW GRAM POSITIVE COCCI IN CLUSTERS RARE GRAM POSITIVE RODS Performed at [facility], [address], [place], [place] [address]    Culture ABUNDANT STAPHYLOCOCCUS EPIDERMIDIS  Final   Report Status 05/16/2023 FINAL  Final   Organism ID, Bacteria STAPHYLOCOCCUS EPIDERMIDIS  Final      Susceptibility   Staphylococcus epidermidis - MIC*    CIPROFLOXACIN  <=0.5 SENSITIVE Sensitive     ERYTHROMYCIN <=0.25 SENSITIVE Sensitive     GENTAMICIN <=0.5 SENSITIVE Sensitive     OXACILLIN >=4 RESISTANT Resistant     TETRACYCLINE >=16 RESISTANT Resistant     VANCOMYCIN  1 SENSITIVE Sensitive     TRIMETH /SULFA  <=10 SENSITIVE Sensitive     CLINDAMYCIN <=0.25 SENSITIVE Sensitive     RIFAMPIN <=0.5 SENSITIVE Sensitive     Inducible Clindamycin NEGATIVE Sensitive     * ABUNDANT STAPHYLOCOCCUS EPIDERMIDIS  Body fluid culture w Gram Stain     Status: None   Collection Time: 05/12/23 10:49 AM   Specimen: Pleural Fluid  Result Value Ref Range Status   Specimen Description PLEURAL  Final   Special Requests NONE  Final   Gram Stain NO WBC SEEN NO ORGANISMS SEEN   Final   Culture   Final    NO GROWTH 3 DAYS Performed at [facility], [address], [place], [place] [address]    Report Status 05/15/2023 FINAL  Final     Radiology Studies: DG CHEST PORT 1 VIEW Result Date:  05/19/2023 CLINICAL DATA:  Cough for 3 days.  Shortness of breath EXAM: PORTABLE CHEST 1 VIEW COMPARISON:  05/17/2023 and older FINDINGS: Stable cardiopericardial silhouette. Calcified aorta. Overlapping cardiac leads. No pneumothorax. Moderate right effusion and tiny left, similar to previous. Adjacent lung opacities also noted. No edema. Degenerative changes along the spine. IMPRESSION: No significant interval change when adjusting for technique. Electronically Signed   By: Ranell Bring M.D.   On: 05/19/2023 11:39     Scheduled Meds:  sodium chloride    Intravenous Once   aspirin  EC  81 mg Oral Daily   atorvastatin   40 mg Oral Daily   Chlorhexidine  Gluconate Cloth  6 each Topical Daily   feeding supplement  237 mL Oral BID BM   ferrous sulfate   325 mg Oral Q breakfast   furosemide   80 mg Intravenous BID   Gerhardt's butt cream   Topical BID   guaiFENesin   600 mg Oral BID   heparin   5,000 Units Subcutaneous Q8H   hydrocerin   Topical BID   insulin  aspart  0-15 Units Subcutaneous TID WC   insulin  aspart  0-5 Units Subcutaneous QHS   insulin  glargine-yfgn  8 Units Subcutaneous Daily   ipratropium-albuterol   3 mL Nebulization Q6H   isosorbide -hydrALAZINE   2 tablet Oral TID   leptospermum manuka honey  1 Application Topical Daily   traZODone   100 mg Oral QHS   Continuous Infusions:   LOS: 8 days    Time spent:    Sigurd Pac, MD [facility]   05/20/2023, 10:59 AM

## 2023-05-21 DIAGNOSIS — I5043 Acute on chronic combined systolic (congestive) and diastolic (congestive) heart failure: Secondary | ICD-10-CM | POA: Diagnosis not present

## 2023-05-21 LAB — CBC
HCT: 24.4 % — ABNORMAL LOW (ref 39.0–52.0)
Hemoglobin: 7.7 g/dL — ABNORMAL LOW (ref 13.0–17.0)
MCH: 33.3 pg (ref 26.0–34.0)
MCHC: 31.6 g/dL (ref 30.0–36.0)
MCV: 105.6 fL — ABNORMAL HIGH (ref 80.0–100.0)
Platelets: 307 10*3/uL (ref 150–400)
RBC: 2.31 MIL/uL — ABNORMAL LOW (ref 4.22–5.81)
RDW: 15.6 % — ABNORMAL HIGH (ref 11.5–15.5)
WBC: 10.6 10*3/uL — ABNORMAL HIGH (ref 4.0–10.5)
nRBC: 0 % (ref 0.0–0.2)

## 2023-05-21 LAB — BASIC METABOLIC PANEL
Anion gap: 9 (ref 5–15)
BUN: 46 mg/dL — ABNORMAL HIGH (ref 8–23)
CO2: 29 mmol/L (ref 22–32)
Calcium: 8.4 mg/dL — ABNORMAL LOW (ref 8.9–10.3)
Chloride: 102 mmol/L (ref 98–111)
Creatinine, Ser: 2.17 mg/dL — ABNORMAL HIGH (ref 0.61–1.24)
GFR, Estimated: 31 mL/min — ABNORMAL LOW (ref >60)
Glucose, Bld: 168 mg/dL — ABNORMAL HIGH (ref 70–99)
Potassium: 3.8 mmol/L (ref 3.5–5.1)
Sodium: 140 mmol/L (ref 135–145)

## 2023-05-21 LAB — GLUCOSE, CAPILLARY
Glucose-Capillary: 140 mg/dL — ABNORMAL HIGH (ref 70–99)
Glucose-Capillary: 159 mg/dL — ABNORMAL HIGH (ref 70–99)
Glucose-Capillary: 243 mg/dL — ABNORMAL HIGH (ref 70–99)
Glucose-Capillary: 198 mg/dL — ABNORMAL HIGH (ref 70–99)

## 2023-05-21 MED ORDER — TORSEMIDE 20 MG PO TABS
40.0000 mg | ORAL_TABLET | Freq: Two times a day (BID) | ORAL | Status: DC
  Administered 2023-05-21 – 2023-05-22 (×3): 40 mg via ORAL
  Filled 2023-05-21 (×3): qty 2

## 2023-05-21 NOTE — Progress Notes (Signed)
   Heart Failure Stewardship Pharmacist Progress Note   PCP: Rexanne Ingle, MD PCP-Cardiologist: None    HPI:  78 yo M with PMH of CAD, CHF, HLD, pulmonary HTN, anemia, T2DM, neuropathy, CKD IV, and OSA.   Admitted from 12/16-12/23 with CHF exacerbation, CAP, and COPD. ECHO 12/17 showed LVEF 40-45%, mild concentric LVH, RV moderately reduced. He was discharged to SNF but had respiratory distress and came back to the ED. He was then intubated in the ED. S/p thoracentesis on 12/24 removing 1900 cc's of fluid from the right pleural space. Extubated on 12/25.   States that he is still experiencing some shortness of breath but attributes this to chest congestion. States the mucinex  helped some. Has diuresed well and transitioned to torsemide .    Current HF Medications: Diuretic: torsemide  40 mg BID Other: BiDil  20/37.5 mg 2 tabs TID  Prior to admission HF Medications: Diuretic: furosemide  40 mg daily Beta blocker: metoprolol  XL 100 mg daily Other: BiDil  20/37.5 mg 2 tabs TID  Pertinent Lab Values: Serum creatinine 2.17, BUN 46, Potassium 3.8, Sodium 140, Magnesium  3.0, A1c 6.4, BNP 202.7  Vital Signs: Weight: 201 lbs (admission weight: 228 lbs) Blood pressure: 130/80s  Heart rate: 70s  I/O: net -2.3L yesterday; net -14.1L since admission  Medication Assistance / Insurance Benefits Check: Does the patient have prescription insurance?  Yes Type of insurance plan: [facility] [employer]  Outpatient Pharmacy:  Prior to admission outpatient pharmacy: Pill pack by [facility] Is the patient willing to use [facility] pharmacy at discharge? Yes Is the patient willing to transition their outpatient pharmacy to utilize a [facility] outpatient pharmacy?   No    Assessment: 1. Acute on chronic systolic CHF (LVEF 40-45%). NYHA class II symptoms. - Now off IV lasix . Agree with transitioning to torsemide  40 mg BID. Strict I/Os and daily weights. Keep K>4 and Mg>2. - Holding PTA metoprolol  XL COPD  exacerbation - Continue BiDil  20/37.5 mg 2 tabs TID - GDMT limited by advanced CKD   Plan: 1) Medication changes recommended at this time: - Agree with changes  2) Patient assistance: - None pending  3)  Education  - Patient has been educated on current HF medications and potential additions to HF medication regimen - Patient verbalizes understanding that over the next few months, these medication doses may change and more medications may be added to optimize HF regimen - Patient has been educated on basic disease state pathophysiology and goals of therapy   Duwaine Plant, PharmD, BCPS Heart Failure Stewardship Pharmacist Phone [phone]

## 2023-05-21 NOTE — Progress Notes (Signed)
 PROGRESS NOTE    CHUNG CHAGOYA  FMW:[id number] DOB: [birth year] DOA: 05/11/2023 PCP: Rexanne Ingle, MD   78 y.o. male patient with a PMHx of CAD (PCI), HTN, dyslipidemia, chronic systolic/diastolic CHF (May 05, 2023: Echo EF 40-45%), pulmonary hypertension (51 mmHg), anemia of chronic illness, DM-2, neuropathy, CKD-4, and possible OSA who was discharged 12/23 for CHF exacerbation, acute hypoxic resp failure, and PNA declined thoracentesis then, who presented from NH after being there for 4 hrs with acute respiratory distress. when EMS arrived, he was saturating in the 60s, and they put him on CPAP and he only improved to the 70s. He failed NRM.  tachycardic, and hypertensive to 140s 150s with tachypnea accessory muscle use and respiratory failure despite CPAP ventilation. He became lethargic, so he was intubated.[name] He quit smoking many yrs ago and he smoked few cigarettes daily.    Significant Events 12/24 Intubated on Propofol   12/24 thoracentesis -Transferred from PCCM to [facility] service, slowly improving with diuresis -Complicated by AKI/CKD, slowly improving    Subjective: Feels fair, breathing overall better, does not think he is close to discharge yet  Assessment and Plan:  Acute Hypoxic Respiratory Failure Due to pneumonia, pleural effusion, and volume overload/pulmonary edema, as below -12/31 >with URI symptoms, nebs, Mucinex  -Improving with supportive care  Acute chronic combined CHF, RV failure Pulmonary hypertension -Last echo 12/24 with a EF 40-45%, moderately reduced RV, moderately elevated PA systolic pressures -Volume overloaded on admission, hypoxic, slowly improving -Diuresed aggressively with IV Lasix , weight down 28 LB, changed to oral torsemide , continue BiDil   -GDMT limited by CKD 4 -PT OT following, unable to walk, SNF recommended> patient adamantly declines this, will discharge home with home health services, high risk of readmission   Hospital Acquired  Pneumonia -Completed 5 days of cefepime  -Respiratory culture with Staph epidermidis, likely contaminant Blood cx NGTD x3  Suspect undiagnosed COPD -Former smoker quit few years ago   Transudative Pleural Effusion   1.9 L removed on 12/24 Cytology without malignant cells, cx with Ngx3 -Improved on repeat x-rays   AKI on CKD IV Baseline creatinine around 2.5-3 Improving, Lasix  as above  Acute on chronic anemia -Hemoglobin has been in the 7.1 range last few days, anemia panel suggestive of chronic disease, Epogen given 12/30 -hb was 6.9 on 12/31, transfused 1 unit of PRBC, anemia panel suggestive of chronic disease, given EPO X1, will repeat today   Coronary Artery Disease Aspirin , statin   PAD History of multiple toe amputations, follow-up with vascular surgery -Continue aspirin  and statin - previously declined angiogram as recommended by vascular  Type 2 Diabetes Stable, SSI   Peripheral Neuropathy   Suspected OSA Needs outpatient sleep study    DVT prophylaxis: heparin  Code Status: full Family Communication: none Disposition Plan: Home likely 1 to 2 days with home health services, he declines rehab  Consultants:    Procedures:   Antimicrobials:    Objective: Vitals:   05/20/23 2006 05/21/23 0002 05/21/23 0451 05/21/23 0839  BP:  (!) 144/74 137/81   Pulse:  80 78   Resp:  15 20   Temp:  98.7 F (37.1 C) 98.8 F (37.1 C)   TempSrc:  Oral Oral   SpO2: 96% 93% 96% 97%  Weight:   91.2 kg   Height:        Intake/Output Summary (Last 24 hours) at 05/21/2023 1014 Last data filed at 05/21/2023 0457 Gross per 24 hour  Intake 306 ml  Output 2300 ml  Net -1994  ml   Filed Weights   05/19/23 0508 05/20/23 0603 05/21/23 0451  Weight: 95.1 kg 92.8 kg 91.2 kg    Examination:  General exam: Chronically ill male sitting up in bed, AAOx3 HEENT: No JVD CVS: S1-S2, regular rhythm Lungs: Improved air movement, few scattered rhonchi  Abd: nondistended, soft and  nontender.Normal bowel sounds heard. Extremities: Chronic ischemic skin changes in both feet, amputation of multiple toes Skin: As above  Data Reviewed:   CBC: Recent Labs  Lab 05/16/23 0229 05/17/23 1449 05/18/23 0246 05/19/23 0304 05/20/23 0251 05/21/23 0240  WBC 8.9 8.6 9.8 8.9 10.6* 10.6*  NEUTROABS 7.8* 6.7  --   --   --   --   HGB 7.1* 7.5* 7.5* 6.9* 7.5* 7.7*  HCT 22.6* 24.0* 24.0* 22.2* 24.2* 24.4*  MCV 110.2* 108.6* 108.6* 109.9* 107.6* 105.6*  PLT 256 297 264 255 289 307   Basic Metabolic Panel: Recent Labs  Lab 05/15/23 0820 05/16/23 0229 05/17/23 1449 05/18/23 0246 05/19/23 0304 05/20/23 0251 05/21/23 0240  NA 140 140 138 139 140 137 140  K 4.4 4.6 4.6 4.5 4.2 3.9 3.8  CL 108 108 106 108 106 101 102  CO2 20* 22 23 21* 26 28 29   GLUCOSE 119* 155* 173* 151* 217* 265* 168*  BUN 62* 59* 52* 48* 44* 45* 46*  CREATININE 3.17* 2.86* 2.43* 2.32* 2.36* 2.41* 2.17*  CALCIUM  8.6* 8.7* 8.4* 8.6* 8.4* 8.3* 8.4*  MG 2.8* 3.0*  --   --   --   --   --   PHOS  --  4.4  --   --   --   --   --    GFR: Estimated Creatinine Clearance: 33.5 mL/min (A) (by C-G formula based on SCr of 2.17 mg/dL (H)). Liver Function Tests: Recent Labs  Lab 05/16/23 0229 05/17/23 1449  AST 22 30  ALT 18 21  ALKPHOS 202* 222*  BILITOT 0.7 0.6  PROT 6.9 7.4  ALBUMIN  2.3* 2.4*   No results for input(s): LIPASE, AMYLASE in the last 168 hours. No results for input(s): AMMONIA in the last 168 hours. Coagulation Profile: No results for input(s): INR, PROTIME in the last 168 hours.  Cardiac Enzymes: No results for input(s): CKTOTAL, CKMB, CKMBINDEX, TROPONINI in the last 168 hours. BNP (last 3 results) No results for input(s): PROBNP in the last 8760 hours. HbA1C: No results for input(s): HGBA1C in the last 72 hours. CBG: Recent Labs  Lab 05/20/23 0603 05/20/23 1117 05/20/23 1629 05/20/23 2101 05/21/23 0613  GLUCAP 213* 187* 216* 157* 140*   Lipid  Profile: No results for input(s): CHOL, HDL, LDLCALC, TRIG, CHOLHDL, LDLDIRECT in the last 72 hours. Thyroid  Function Tests: No results for input(s): TSH, T4TOTAL, FREET4, T3FREE, THYROIDAB in the last 72 hours. Anemia Panel: No results for input(s): VITAMINB12, FOLATE, FERRITIN, TIBC, IRON , RETICCTPCT in the last 72 hours.  Urine analysis:    Component Value Date/Time   COLORURINE YELLOW 05/12/2023 0022   APPEARANCEUR HAZY (A) 05/12/2023 0022   LABSPEC 1.017 05/12/2023 0022   PHURINE 5.0 05/12/2023 0022   GLUCOSEU NEGATIVE 05/12/2023 0022   HGBUR SMALL (A) 05/12/2023 0022   BILIRUBINUR NEGATIVE 05/12/2023 0022   KETONESUR NEGATIVE 05/12/2023 0022   PROTEINUR 100 (A) 05/12/2023 0022   UROBILINOGEN 1.0 02/13/2015 1934   NITRITE NEGATIVE 05/12/2023 0022   LEUKOCYTESUR NEGATIVE 05/12/2023 0022   Sepsis Labs: @LABRCNTIP (procalcitonin:4,lacticidven:4)  ) Recent Results (from the past 240 hours)  Resp panel by RT-PCR (RSV, Flu  A&B, Covid) Anterior Nasal Swab     Status: None   Collection Time: 05/12/23 12:21 AM   Specimen: Anterior Nasal Swab  Result Value Ref Range Status   SARS Coronavirus 2 by RT PCR NEGATIVE NEGATIVE Final   Influenza A by PCR NEGATIVE NEGATIVE Final   Influenza B by PCR NEGATIVE NEGATIVE Final    Comment: (NOTE) The Xpert Xpress SARS-CoV-2/FLU/RSV plus assay is intended as an aid in the diagnosis of influenza from Nasopharyngeal swab specimens and should not be used as a sole basis for treatment. Nasal washings and aspirates are unacceptable for Xpert Xpress SARS-CoV-2/FLU/RSV testing.  Fact Sheet for Patients: bloggercourse.com  Fact Sheet for Healthcare Providers: seriousbroker.it  This test is not yet approved or cleared by the [place]  FDA and has been authorized for detection and/or diagnosis of SARS-CoV-2 by FDA under an Emergency Use Authorization (EUA).  This EUA will remain in effect (meaning this test can be used) for the duration of the COVID-19 declaration under Section 564(b)(1) of the Act, 21 U.S.C. section 360bbb-3(b)(1), unless the authorization is terminated or revoked.     Resp Syncytial Virus by PCR NEGATIVE NEGATIVE Final    Comment: (NOTE) Fact Sheet for Patients: bloggercourse.com  Fact Sheet for Healthcare Providers: seriousbroker.it  This test is not yet approved or cleared by the [place]  FDA and has been authorized for detection and/or diagnosis of SARS-CoV-2 by FDA under an Emergency Use Authorization (EUA). This EUA will remain in effect (meaning this test can be used) for the duration of the COVID-19 declaration under Section 564(b)(1) of the Act, 21 U.S.C. section 360bbb-3(b)(1), unless the authorization is terminated or revoked.  Performed at [facility], [address], [place], [place] [address]   Blood Culture (routine x 2)     Status: None   Collection Time: 05/12/23 12:34 AM   Specimen: BLOOD RIGHT ARM  Result Value Ref Range Status   Specimen Description BLOOD RIGHT ARM  Final   Special Requests   Final    BOTTLES DRAWN AEROBIC AND ANAEROBIC Blood Culture adequate volume   Culture   Final    NO GROWTH 5 DAYS Performed at [facility], [address], [place], [place] [address]    Report Status 05/17/2023 FINAL  Final  Blood Culture (routine x 2)     Status: None   Collection Time: 05/12/23 12:45 AM   Specimen: BLOOD RIGHT HAND  Result Value Ref Range Status   Specimen Description BLOOD RIGHT HAND  Final   Special Requests   Final    BOTTLES DRAWN AEROBIC AND ANAEROBIC Blood Culture adequate volume   Culture   Final    NO GROWTH 5 DAYS Performed at [facility], [address], [place], [place] [address]    Report Status 05/17/2023 FINAL  Final  MRSA Next Gen by PCR, Nasal     Status: None   Collection Time: 05/12/23   3:45 AM   Specimen: Nasal Mucosa; Nasal Swab  Result Value Ref Range Status   MRSA by PCR Next Gen NOT DETECTED NOT DETECTED Final    Comment: (NOTE) The GeneXpert MRSA Assay (FDA approved for NASAL specimens only), is one component of a comprehensive MRSA colonization surveillance program. It is not intended to diagnose MRSA infection nor to guide or monitor treatment for MRSA infections. Test performance is not FDA approved in patients less than 23 years old. Performed at [facility], [address]  [place], [place] [address]   Culture, Respiratory w Gram Stain     Status: None   Collection Time: 05/12/23  9:10 AM   Specimen: Tracheal Aspirate; Respiratory  Result Value Ref Range Status   Specimen Description TRACHEAL ASPIRATE  Final   Special Requests NONE  Final   Gram Stain   Final    NO WBC SEEN FEW GRAM POSITIVE COCCI IN CLUSTERS RARE GRAM POSITIVE RODS Performed at [facility], [address], [place], [place] [address]    Culture ABUNDANT STAPHYLOCOCCUS EPIDERMIDIS  Final   Report Status 05/16/2023 FINAL  Final   Organism ID, Bacteria STAPHYLOCOCCUS EPIDERMIDIS  Final      Susceptibility   Staphylococcus epidermidis - MIC*    CIPROFLOXACIN  <=0.5 SENSITIVE Sensitive     ERYTHROMYCIN <=0.25 SENSITIVE Sensitive     GENTAMICIN <=0.5 SENSITIVE Sensitive     OXACILLIN >=4 RESISTANT Resistant     TETRACYCLINE >=16 RESISTANT Resistant     VANCOMYCIN  1 SENSITIVE Sensitive     TRIMETH /SULFA  <=10 SENSITIVE Sensitive     CLINDAMYCIN <=0.25 SENSITIVE Sensitive     RIFAMPIN <=0.5 SENSITIVE Sensitive     Inducible Clindamycin NEGATIVE Sensitive     * ABUNDANT STAPHYLOCOCCUS EPIDERMIDIS  Body fluid culture w Gram Stain     Status: None   Collection Time: 05/12/23 10:49 AM   Specimen: Pleural Fluid  Result Value Ref Range Status   Specimen Description PLEURAL  Final   Special Requests NONE  Final   Gram Stain NO WBC SEEN NO ORGANISMS SEEN   Final   Culture    Final    NO GROWTH 3 DAYS Performed at [facility], [address], [place], [place] [address]    Report Status 05/15/2023 FINAL  Final     Radiology Studies: No results found.    Scheduled Meds:  aspirin  EC  81 mg Oral Daily   atorvastatin   40 mg Oral Daily   Chlorhexidine  Gluconate Cloth  6 each Topical Daily   feeding supplement  237 mL Oral BID BM   ferrous sulfate   325 mg Oral Q breakfast   Gerhardt's butt cream   Topical BID   guaiFENesin   600 mg Oral BID   heparin   5,000 Units Subcutaneous Q8H   hydrocerin   Topical BID   insulin  aspart  0-15 Units Subcutaneous TID WC   insulin  aspart  0-5 Units Subcutaneous QHS   insulin  glargine-yfgn  15 Units Subcutaneous Daily   ipratropium-albuterol   3 mL Nebulization Q6H   isosorbide -hydrALAZINE   2 tablet Oral TID   leptospermum manuka honey  1 Application Topical Daily   torsemide   40 mg Oral BID   traZODone   100 mg Oral QHS   Continuous Infusions:   LOS: 9 days    Time spent:    Sigurd Pac, MD [facility]   05/21/2023, 10:14 AM

## 2023-05-21 NOTE — Plan of Care (Signed)

## 2023-05-21 NOTE — Progress Notes (Signed)
 PT Cancellation Note  Patient Details Name: David [name] MRN: [id number] DOB: 1946-02-03   Cancelled Treatment:    Reason Eval/Treat Not Completed: Patient declined, no reason specified (despite encouragement pt declined mobility OOB or bed level exercises. Will follow up at later date/time as pt able and schedule allows.)   Vernell KNIGHTS PT, DPT Acute Rehabilitation Services Office [phone]  05/21/23 2:53 PM

## 2023-05-22 DIAGNOSIS — I5043 Acute on chronic combined systolic (congestive) and diastolic (congestive) heart failure: Secondary | ICD-10-CM | POA: Diagnosis not present

## 2023-05-22 LAB — GLUCOSE, CAPILLARY
Glucose-Capillary: 192 mg/dL — ABNORMAL HIGH (ref 70–99)
Glucose-Capillary: 160 mg/dL — ABNORMAL HIGH (ref 70–99)
Glucose-Capillary: 102 mg/dL — ABNORMAL HIGH (ref 70–99)

## 2023-05-22 LAB — CBC
HCT: 24.3 % — ABNORMAL LOW (ref 39.0–52.0)
Hemoglobin: 7.5 g/dL — ABNORMAL LOW (ref 13.0–17.0)
MCH: 33.5 pg (ref 26.0–34.0)
MCHC: 30.9 g/dL (ref 30.0–36.0)
MCV: 108.5 fL — ABNORMAL HIGH (ref 80.0–100.0)
Platelets: 320 10*3/uL (ref 150–400)
RBC: 2.24 MIL/uL — ABNORMAL LOW (ref 4.22–5.81)
RDW: 15.5 % (ref 11.5–15.5)
WBC: 9.8 10*3/uL (ref 4.0–10.5)
nRBC: 0 % (ref 0.0–0.2)

## 2023-05-22 LAB — BASIC METABOLIC PANEL
Anion gap: 9 (ref 5–15)
BUN: 52 mg/dL — ABNORMAL HIGH (ref 8–23)
CO2: 28 mmol/L (ref 22–32)
Calcium: 8.3 mg/dL — ABNORMAL LOW (ref 8.9–10.3)
Chloride: 100 mmol/L (ref 98–111)
Creatinine, Ser: 2.72 mg/dL — ABNORMAL HIGH (ref 0.61–1.24)
GFR, Estimated: 23 mL/min — ABNORMAL LOW (ref >60)
Glucose, Bld: 219 mg/dL — ABNORMAL HIGH (ref 70–99)
Potassium: 4.2 mmol/L (ref 3.5–5.1)
Sodium: 137 mmol/L (ref 135–145)

## 2023-05-22 MED ORDER — TORSEMIDE 20 MG PO TABS
40.0000 mg | ORAL_TABLET | Freq: Two times a day (BID) | ORAL | Status: DC
  Administered 2023-05-23: 40 mg via ORAL
  Filled 2023-05-22: qty 2

## 2023-05-22 MED ORDER — IPRATROPIUM-ALBUTEROL 0.5-2.5 (3) MG/3ML IN SOLN
3.0000 mL | Freq: Two times a day (BID) | RESPIRATORY_TRACT | Status: DC
  Administered 2023-05-22 – 2023-05-23 (×2): 3 mL via RESPIRATORY_TRACT
  Filled 2023-05-22 (×4): qty 3

## 2023-05-22 MED ORDER — DARBEPOETIN ALFA 60 MCG/0.3ML IJ SOSY
60.0000 ug | PREFILLED_SYRINGE | Freq: Once | INTRAMUSCULAR | Status: AC
  Administered 2023-05-23: 60 ug via SUBCUTANEOUS
  Filled 2023-05-22 (×2): qty 0.3

## 2023-05-22 NOTE — Progress Notes (Signed)
 PROGRESS NOTE    David [name]  FMW:[id number] DOB: January 16, 1946 DOA: 05/11/2023 PCP: David Ingle, MD   78 y.o. male patient with a PMHx of CAD (PCI), HTN, dyslipidemia, chronic systolic/diastolic CHF (May 05, 2023: Echo EF 40-45%), pulmonary hypertension (51 mmHg), anemia of chronic illness, DM-2, neuropathy, CKD-4, and possible OSA who was discharged 12/23 for CHF exacerbation, acute hypoxic resp failure, and PNA declined thoracentesis then, who presented from NH after being there for 4 hrs with acute respiratory distress. when EMS arrived, he was saturating in the 60s, and they put him on CPAP and he only improved to the 70s. He failed NRM.  tachycardic, and hypertensive to 140s 150s with tachypnea accessory muscle use and respiratory failure despite CPAP ventilation. He became lethargic, so he was intubated.David [name] He quit smoking many yrs ago and he smoked few cigarettes daily.    Significant Events 12/24 Intubated on Propofol   12/24 thoracentesis -Transferred from PCCM to [facility] service, slowly improving with diuresis -Complicated by AKI/CKD, slowly improving    Subjective: Feels a little better overall, cough is better -David [name] that he continues to need hospital care  Assessment and Plan:  Acute Hypoxic Respiratory Failure Due to pneumonia, pleural effusion, and volume overload/pulmonary edema, as below -12/31 >with URI symptoms, nebs, Mucinex  -Improving with supportive care  Acute chronic combined CHF, RV failure Pulmonary hypertension -Last echo 12/24 with a EF 40-45%, moderately reduced RV, moderately elevated PA systolic pressures -Volume overloaded on admission, hypoxic, slowly improving -Diuresed aggressively with IV Lasix , weight down 28 LB, changed to oral torsemide , continue BiDil , hold next torsemide  dose today with uptrending creatinine -GDMT limited by CKD 4 -PT OT following, unable to stand or walk, SNF recommended> patient adamantly declines this, TOC to discussed  with spouse  Hospital Acquired Pneumonia -Completed 5 days of cefepime  -Respiratory culture with Staph epidermidis, likely contaminant Blood cx NGTD x3  Suspect undiagnosed COPD -Former smoker quit few years ago   Transudative Pleural Effusion   1.9 L removed on 12/24 Cytology without malignant cells, cx with Ngx3 -Improved on repeat x-rays   AKI on CKD IV Baseline creatinine around 2.5-3 Improving, Lasix  as above  Acute on chronic anemia -Hemoglobin has been in the 7.1 range last few days, anemia panel suggestive of chronic disease, Epogen given 12/30 -hb was 6.9 on 12/31, transfused 1 unit of PRBC, anemia panel suggestive of chronic disease, given EPO X1, will repeat today   Coronary Artery Disease Aspirin , statin   PAD History of multiple toe amputations, follow-up with vascular surgery -Continue aspirin  and statin - previously declined angiogram as recommended by vascular  Type 2 Diabetes Stable, SSI   Peripheral Neuropathy   Suspected OSA Needs outpatient sleep study    DVT prophylaxis: heparin  Code Status: full Family Communication: none Disposition Plan: Needs SNF, declines this, home with home health services  Consultants:    Procedures:   Antimicrobials:    Objective: Vitals:   05/21/23 2010 05/22/23 0036 05/22/23 0342 05/22/23 0810  BP: (!) 125/52 128/61 134/63 (!) 145/68  Pulse:  75  71  Resp: 17 17 17  (!) 21  Temp: 98.9 F (37.2 C) 98.6 F (37 C) 99 F (37.2 C) 98.6 F (37 C)  TempSrc: Oral Oral Oral Oral  SpO2: 100% 98% 99% 98%  Weight:   91.7 kg   Height:        Intake/Output Summary (Last 24 hours) at 05/22/2023 1206 Last data filed at 05/22/2023 0445 Gross per 24 hour  Intake 240  ml  Output 2050 ml  Net -1810 ml   Filed Weights   05/20/23 0603 05/21/23 0451 05/22/23 0342  Weight: 92.8 kg 91.2 kg 91.7 kg    Examination:  General exam: Chronically ill male sitting up in bed, AAOx3 HEENT: No JVD CVS: S1-S2, regular  rhythm Lungs: Few scattered rhonchi, improved air movement  Abd: nondistended, soft and nontender.Normal bowel sounds heard. Extremities: Chronic ischemic skin changes in both feet, amputation of multiple toes Skin: As above  Data Reviewed:   CBC: Recent Labs  Lab 05/16/23 0229 05/17/23 1449 05/18/23 0246 05/19/23 0304 05/20/23 0251 05/21/23 0240 05/22/23 0312  WBC 8.9 8.6 9.8 8.9 10.6* 10.6* 9.8  NEUTROABS 7.8* 6.7  --   --   --   --   --   HGB 7.1* 7.5* 7.5* 6.9* 7.5* 7.7* 7.5*  HCT 22.6* 24.0* 24.0* 22.2* 24.2* 24.4* 24.3*  MCV 110.2* 108.6* 108.6* 109.9* 107.6* 105.6* 108.5*  PLT 256 297 264 255 289 307 320   Basic Metabolic Panel: Recent Labs  Lab 05/16/23 0229 05/17/23 1449 05/18/23 0246 05/19/23 0304 05/20/23 0251 05/21/23 0240 05/22/23 0312  NA 140   < > 139 140 137 140 137  K 4.6   < > 4.5 4.2 3.9 3.8 4.2  CL 108   < > 108 106 101 102 100  CO2 22   < > 21* 26 28 29 28   GLUCOSE 155*   < > 151* 217* 265* 168* 219*  BUN 59*   < > 48* 44* 45* 46* 52*  CREATININE 2.86*   < > 2.32* 2.36* 2.41* 2.17* 2.72*  CALCIUM  8.7*   < > 8.6* 8.4* 8.3* 8.4* 8.3*  MG 3.0*  --   --   --   --   --   --   PHOS 4.4  --   --   --   --   --   --    < > = values in this interval not displayed.   GFR: Estimated Creatinine Clearance: 26.8 mL/min (A) (by C-G formula based on SCr of 2.72 mg/dL (H)). Liver Function Tests: Recent Labs  Lab 05/16/23 0229 05/17/23 1449  AST 22 30  ALT 18 21  ALKPHOS 202* 222*  BILITOT 0.7 0.6  PROT 6.9 7.4  ALBUMIN  2.3* 2.4*   No results for input(s): LIPASE, AMYLASE in the last 168 hours. No results for input(s): AMMONIA in the last 168 hours. Coagulation Profile: No results for input(s): INR, PROTIME in the last 168 hours.  Cardiac Enzymes: No results for input(s): CKTOTAL, CKMB, CKMBINDEX, TROPONINI in the last 168 hours. BNP (last 3 results) No results for input(s): PROBNP in the last 8760 hours. HbA1C: No results  for input(s): HGBA1C in the last 72 hours. CBG: Recent Labs  Lab 05/21/23 1150 05/21/23 1646 05/21/23 2130 05/22/23 0605 05/22/23 1130  GLUCAP 159* 243* 198* 192* 160*   Lipid Profile: No results for input(s): CHOL, HDL, LDLCALC, TRIG, CHOLHDL, LDLDIRECT in the last 72 hours. Thyroid  Function Tests: No results for input(s): TSH, T4TOTAL, FREET4, T3FREE, THYROIDAB in the last 72 hours. Anemia Panel: No results for input(s): VITAMINB12, FOLATE, FERRITIN, TIBC, IRON , RETICCTPCT in the last 72 hours.  Urine analysis:    Component Value Date/Time   COLORURINE YELLOW 05/12/2023 0022   APPEARANCEUR HAZY (A) 05/12/2023 0022   LABSPEC 1.017 05/12/2023 0022   PHURINE 5.0 05/12/2023 0022   GLUCOSEU NEGATIVE 05/12/2023 0022   HGBUR SMALL (A) 05/12/2023 0022   BILIRUBINUR  NEGATIVE 05/12/2023 0022   KETONESUR NEGATIVE 05/12/2023 0022   PROTEINUR 100 (A) 05/12/2023 0022   UROBILINOGEN 1.0 02/13/2015 1934   NITRITE NEGATIVE 05/12/2023 0022   LEUKOCYTESUR NEGATIVE 05/12/2023 0022   Sepsis Labs: @LABRCNTIP (procalcitonin:4,lacticidven:4)  ) No results found for this or any previous visit (from the past 240 hours).    Radiology Studies: No results found.    Scheduled Meds:  aspirin  EC  81 mg Oral Daily   atorvastatin   40 mg Oral Daily   Chlorhexidine  Gluconate Cloth  6 each Topical Daily   feeding supplement  237 mL Oral BID BM   ferrous sulfate   325 mg Oral Q breakfast   Gerhardt's butt cream   Topical BID   guaiFENesin   600 mg Oral BID   heparin   5,000 Units Subcutaneous Q8H   hydrocerin   Topical BID   insulin  aspart  0-15 Units Subcutaneous TID WC   insulin  aspart  0-5 Units Subcutaneous QHS   insulin  glargine-yfgn  15 Units Subcutaneous Daily   ipratropium-albuterol   3 mL Nebulization BID   isosorbide -hydrALAZINE   2 tablet Oral TID   leptospermum manuka honey  1 Application Topical Daily   [START ON 05/23/2023] torsemide   40 mg Oral BID    traZODone   100 mg Oral QHS   Continuous Infusions:   LOS: 10 days    Time spent:    Sigurd Pac, MD [facility]   05/22/2023, 12:06 PM

## 2023-05-22 NOTE — TOC Progression Note (Signed)
 Transition of Care [facility]) - Progression Note    Patient Details  Name: ORANGE HILLIGOSS MRN: [id number] Date of Birth: 1946/03/07  Transition of [facility]) CM/SW Contact  Waddell Barnie Rama, RN Phone Number: 05/22/2023, 4:53 PM  Clinical Narrative:    NCM offered choice, he has no preference, NCM made referral to [facility] for HHRN, HHPT, HHOT and CSW.  She is able to take referral.  Soc will begin 24 to 48 hrs post dc.  Will need orders.         Expected Discharge Plan and Services                                               Social Determinants of Health (SDOH) Interventions SDOH Screenings   Food Insecurity: No Food Insecurity (05/14/2023)  Housing: Low Risk  (05/18/2023)  Transportation Needs: No Transportation Needs (05/18/2023)  Recent Concern: Transportation Needs - Unmet Transportation Needs (05/05/2023)  Utilities: Not At Risk (05/14/2023)  Alcohol Screen: Low Risk  (05/18/2023)  Financial Resource Strain: Low Risk  (05/18/2023)  Physical Activity: Inactive (08/20/2017)  Social Connections: Socially Integrated (05/19/2023)  Stress: No Stress Concern Present (08/20/2017)  Tobacco Use: Medium Risk (05/14/2023)    Readmission Risk Interventions     No data to display

## 2023-05-22 NOTE — Plan of Care (Signed)

## 2023-05-22 NOTE — Progress Notes (Signed)
 Physical Therapy Treatment Patient Details Name: David [name] MRN: [id number] DOB: 03/19/1946 Today's Date: 05/22/2023   History of Present Illness 78 y.o. male presented 05/11/23 with Acute hypoxemic respiratory failure,  Acute pulmonary edema,  HAP,  Right pleural effusion s/p thoracentesis.  PMHx of CAD (PCI), HTN, dyslipidemia, chronic systolic/diastolic CHF, pulmonary hypertension, anemia of chronic illness, DM-2, neuropathy, CKD-4, and possible OSA.    PT Comments  Patient resting in bed and discussed goal to mobilize OOB with RN earlier this morning, pt agreeable to participate in therapy.  Mod assist required for supine>sit EOB and heavy use of bed features. Pt required cues to obtain more midline posture at EOB for stable sitting balance and tending to lean/weight shift Rt to avoid sitting on bottom due to sore. Patient required Mod-Max +2 assist for sit<>stand to [facility] and EOB elevated significantly for pt's knee comfort and to be able to power up. Dependent transfer bed>chair completed and EOS reviewed benefits of time OOB and mobility. Pt agreeable to remain OOB at EOS, Alarm on and call bell within reach. Will continue to progress as able and continue to recommend continued inpatient follow up therapy, <3 hours/day.    If plan is discharge home, recommend the following: Two people to help with walking and/or transfers;Assist for transportation;Help with stairs or ramp for entrance;Two people to help with bathing/dressing/bathroom;Assistance with cooking/housework;Direct supervision/assist for medications management   Can travel by private vehicle     No  Equipment Recommendations  Hoyer lift;Hospital bed;Wheelchair (measurements PT);Wheelchair cushion (measurements PT)    Recommendations for Other Services       Precautions / Restrictions Precautions Precautions: Fall Restrictions Weight Bearing Restrictions Per Provider Order: No     Mobility  Bed Mobility Overal bed  mobility: Needs Assistance Bed Mobility: Supine to Sit, Rolling Rolling: Mod assist, Used rails   Supine to sit: Mod assist, HOB elevated, Used rails     General bed mobility comments: cues for sequencing use of bed rails and to walk bil LE's to bring them off EOB. cues for midline weight shift and to place bil hands on knees to improve posture. pt shifted Rt to aoivd wounds on buttocks.    Transfers Overall transfer level: Needs assistance Equipment used: Ambulation equipment used Transfers: Sit to/from Stand, Bed to chair/wheelchair/BSC Sit to Stand: Mod assist, Max assist, +2 physical assistance, +2 safety/equipment, From elevated surface, Via lift equipment           General transfer comment: cues for safe hand placement on crossbar of Stedy for transfer. pt required EOB greatly elevated and mod-max assist +2 to rise and dependent transfer completed for bed>chair transfer. Transfer via Lift Equipment: Stedy  Ambulation/Gait                   Stairs             Wheelchair Mobility     Tilt Bed    Modified Rankin (Stroke Patients Only)       Balance Overall balance assessment: Needs assistance Sitting-balance support: Bilateral upper extremity supported, Feet supported Sitting balance-Leahy Scale: Poor     Standing balance support: Bilateral upper extremity supported, During functional activity, Reliant on assistive device for balance Standing balance-Leahy Scale: Poor Standing balance comment: Reliant on therapists and external device/support                            Cognition Arousal: Alert Behavior During Therapy:  WFL for tasks assessed/performed Overall Cognitive Status: Within Functional Limits for tasks assessed Area of Impairment: Attention, Memory, Following commands, Safety/judgement, Awareness, Problem solving                   Current Attention Level: Sustained Memory: Decreased recall of precautions, Decreased  short-term memory Following Commands: Follows one step commands consistently, Follows one step commands with increased time, Follows one step commands inconsistently Safety/Judgement: Decreased awareness of safety, Decreased awareness of deficits Awareness: Emergent Problem Solving: Slow processing, Difficulty sequencing, Requires verbal cues          Exercises      General Comments        Pertinent Vitals/Pain Pain Assessment Pain Assessment: Faces Faces Pain Scale: Hurts little more Pain Location: generalized and buttocks Pain Descriptors / Indicators: Aching, Discomfort, Grimacing, Moaning Pain Intervention(s): Limited activity within patient's tolerance, Monitored during session, Repositioned, Patient requesting pain meds-RN notified    Home Living                          Prior Function            PT Goals (current goals can now be found in the care plan section) Acute Rehab PT Goals PT Goal Formulation: With patient Time For Goal Achievement: 05/28/23 Potential to Achieve Goals: Fair Progress towards PT goals: Progressing toward goals    Frequency    Min 1X/week      PT Plan      Co-evaluation PT/OT/SLP Co-Evaluation/Treatment: Yes Reason for Co-Treatment: For patient/therapist safety;To address functional/ADL transfers PT goals addressed during session: Mobility/safety with mobility;Balance;Proper use of DME OT goals addressed during session: ADL's and self-care;Proper use of Adaptive equipment and DME      AM-PAC PT 6 Clicks Mobility   Outcome Measure  Help needed turning from your back to your side while in a flat bed without using bedrails?: A Lot Help needed moving from lying on your back to sitting on the side of a flat bed without using bedrails?: A Lot Help needed moving to and from a bed to a chair (including a wheelchair)?: Total Help needed standing up from a chair using your arms (e.g., wheelchair or bedside chair)?:  Total Help needed to walk in hospital room?: Total Help needed climbing 3-5 steps with a railing? : Total 6 Click Score: 8    End of Session Equipment Utilized During Treatment: Gait belt Activity Tolerance: Patient tolerated treatment well Patient left: in chair;with call bell/phone within reach;with chair alarm set Nurse Communication: Mobility status PT Visit Diagnosis: Muscle weakness (generalized) (M62.81);Difficulty in walking, not elsewhere classified (R26.2);Pain     Time: 9056-8986 PT Time Calculation (min) (ACUTE ONLY): 30 min  Charges:    $Therapeutic Activity: 8-22 mins PT General Charges $$ ACUTE PT VISIT: 1 Visit                     Vernell DONEEN KLEIN, DPT Acute Rehabilitation Services Office [phone]  05/22/23 1:26 PM

## 2023-05-22 NOTE — Progress Notes (Signed)
 Mobility Specialist Progress Note:    05/22/23 1552  Mobility  Activity Transferred from chair to bed  Level of Assistance Maximum assist, patient does 25-49% (+2)  Assistive Device Stedy  Activity Response Tolerated well  Mobility Referral Yes  Mobility visit 1 Mobility  Mobility Specialist Start Time (ACUTE ONLY) 1400  Mobility Specialist Stop Time (ACUTE ONLY) 1420  Mobility Specialist Time Calculation (min) (ACUTE ONLY) 20 min   Pt received in bed requesting to get back to bed. Pt required MaxA +2 w/ the stedy to get back to bed. C/o L leg pain, othewise no c/o. Left in bed w/ call bell and personal belongings in reach. All needs met. Bed alarm on.  Thersia Minder Mobility Specialist  Please contact vis Secure Chat or  Rehab Office [phone]

## 2023-05-22 NOTE — Progress Notes (Signed)
   Heart Failure Stewardship Pharmacist Progress Note   PCP: Rexanne Ingle, MD PCP-Cardiologist: None    HPI:  78 yo M with PMH of CAD, CHF, HLD, pulmonary HTN, anemia, T2DM, neuropathy, CKD IV, and OSA.   Admitted from 12/16-12/23 with CHF exacerbation, CAP, and COPD. ECHO 12/17 showed LVEF 40-45%, mild concentric LVH, RV moderately reduced. He was discharged to SNF but had respiratory distress and came back to the ED. He was then intubated in the ED. S/p thoracentesis on 12/24 removing 1900 cc's of fluid from the right pleural space. Extubated on 12/25.   Has diuresed well and transitioned to torsemide .  Creatinine now increased from 2.17>2.72 today. Denies shortness of breath. Now off oxygen. States that he is no longer having the nasal drip after the nasal cannula was removed.   Current HF Medications: Diuretic: torsemide  40 mg BID Other: BiDil  20/37.5 mg 2 tabs TID  Prior to admission HF Medications: Diuretic: furosemide  40 mg daily Beta blocker: metoprolol  XL 100 mg daily Other: BiDil  20/37.5 mg 2 tabs TID  Pertinent Lab Values: Serum creatinine 2.17>2.72, BUN 52, Potassium 4.2, Sodium 137, Magnesium  3.0, A1c 6.4, BNP 202.7  Vital Signs: Weight: 202 lbs (admission weight: 228 lbs) Blood pressure: 130-140/60s  Heart rate: 70s  I/O: net -1.5L yesterday; net -16L since admission  Medication Assistance / Insurance Benefits Check: Does the patient have prescription insurance?  Yes Type of insurance plan: [facility] Medicare  Outpatient Pharmacy:  Prior to admission outpatient pharmacy: Pill pack by [facility] Is the patient willing to use [facility] TOC pharmacy at discharge? Yes Is the patient willing to transition their outpatient pharmacy to utilize a [facility] outpatient pharmacy?   No    Assessment: 1. Acute on chronic systolic CHF (LVEF 40-45%). NYHA class II symptoms. - On torsemide  40 mg BID, creatinine increased from 2.17>2.72. Consider holding torsemide  today. Strict I/Os and  daily weights. Keep K>4 and Mg>2. - Holding PTA metoprolol  XL COPD exacerbation - Continue BiDil  20/37.5 mg 2 tabs TID - GDMT limited by advanced CKD   Plan: 1) Medication changes recommended at this time: - Hold torsemide  today with AKI  2) Patient assistance: - None pending  3)  Education  - Patient has been educated on current HF medications and potential additions to HF medication regimen - Patient verbalizes understanding that over the next few months, these medication doses may change and more medications may be added to optimize HF regimen - Patient has been educated on basic disease state pathophysiology and goals of therapy   Duwaine Plant, PharmD, BCPS Heart Failure Stewardship Pharmacist Phone [phone]

## 2023-05-22 NOTE — Progress Notes (Signed)
 Occupational Therapy Treatment Patient Details Name: David [name] MRN: [id number] DOB: 1945-08-23 Today's Date: 05/22/2023   History of present illness 78 y.o. male presented 05/11/23 with Acute hypoxemic respiratory failure,  Acute pulmonary edema,  HAP,  Right pleural effusion s/p thoracentesis.  PMHx of CAD (PCI), HTN, dyslipidemia, chronic systolic/diastolic CHF, pulmonary hypertension, anemia of chronic illness, DM-2, neuropathy, CKD-4, and possible OSA.   OT comments  Pt. Seen for skilled therapy session with PT.  Pt. Able to complete bed mobility with MOD A.  Able to maintain supported and un supported sitting balance eob for attempts at [facility] ADL task.  Prefers RUE support on bed rail but able to sit unsupported with encouragement.  Use of stedy transfer equipment from eob to recliner.  Pt. With good use of BUEs to assist with pulling into semi standing position on stedy.  Max verbal and tactile cues for all task initiation and completion.  Encouragement and review of benefits of sitting up in recliner.  Pt. Verbalized understanding and agreed.  Cont. With acute OT POC.        If plan is discharge home, recommend the following:  Two people to help with walking and/or transfers;Two people to help with bathing/dressing/bathroom;Assistance with cooking/housework;Help with stairs or ramp for entrance;Assist for transportation   Equipment Recommendations  Other (comment)    Recommendations for Other Services      Precautions / Restrictions Precautions Precautions: Fall       Mobility Bed Mobility Overal bed mobility: Needs Assistance Bed Mobility: Supine to Sit, Rolling Rolling: Mod assist, Used rails         General bed mobility comments: cues for sequencing use of bed rails to aide in pulling/semi-rolling towards eob R side.  cues for moving each leg to bring them towards eob.  cues for upright sitting, heavy lean to R pt. stating it was secondary to pain on buttocks.  with  encouragement able to sit upright in un supported sitting    Transfers Overall transfer level: Needs assistance Equipment used: Ambulation equipment used Transfers: Bed to chair/wheelchair/BSC, Sit to/from Stand Sit to Stand: Max assist, +2 physical assistance, From elevated surface, +2 safety/equipment           General transfer comment: improved sitting balance with encouragement and cues for hand placement.  able to use stedy with one step commands and intermittent tactile guidance for proper hand placement and sequencing Transfer via Lift Equipment: Stedy   Balance                                           ADL either performed or assessed with clinical judgement   ADL Overall ADL's : Needs assistance/impaired                     Lower Body Dressing: Total assistance;Sitting/lateral leans Lower Body Dressing Details (indicate cue type and reason): attempted compensatory strategies and education with pt. and he adamantly refused stating he could not Toilet Transfer: Maximal assistance;+2 for safety/equipment;+2 for physical assistance;Cueing for safety;Cueing for sequencing Toilet Transfer Details (indicate cue type and reason): simulated during stedy transfer to recliner         Functional mobility during ADLs: Maximal assistance;+2 for safety/equipment;+2 for physical assistance (stedy transfer equipment used)      Extremity/Trunk Assessment  Vision       Perception     Praxis      Cognition Arousal: Alert Behavior During Therapy: WFL for tasks assessed/performed Overall Cognitive Status: Within Functional Limits for tasks assessed Area of Impairment: Attention, Memory, Following commands, Safety/judgement, Awareness, Problem solving                   Current Attention Level: Sustained Memory: Decreased recall of precautions, Decreased short-term memory Following Commands: Follows one step commands  consistently, Follows one step commands with increased time, Follows one step commands inconsistently Safety/Judgement: Decreased awareness of safety, Decreased awareness of deficits Awareness: Emergent Problem Solving: Slow processing, Difficulty sequencing, Requires verbal cues General Comments: pt. asking for clarification with all one step commands ie: please lift your left leg what do you mean what does lift mean, which leg even with tactile clarification,demo, and explanation pt would continue to repeat and ask questions following one step commands        Exercises      Shoulder Instructions       General Comments      Pertinent Vitals/ Pain       Pain Assessment Pain Assessment: Faces Faces Pain Scale: Hurts little more Pain Location: BLEs with movement, and buttocks Pain Descriptors / Indicators: Aching, Discomfort, Grimacing Pain Intervention(s): Limited activity within patient's tolerance, Monitored during session, Repositioned, Patient requesting pain meds-RN notified, Other (comment) (PT notified RN of medication needs)  Home Living                                          Prior Functioning/Environment              Frequency  Min 1X/week        Progress Toward Goals  OT Goals(current goals can now be found in the care plan section)  Progress towards OT goals: Progressing toward goals     Plan      Co-evaluation    PT/OT/SLP Co-Evaluation/Treatment: Yes Reason for Co-Treatment: For patient/therapist safety;To address functional/ADL transfers   OT goals addressed during session: ADL's and self-care;Proper use of Adaptive equipment and DME      AM-PAC OT 6 Clicks Daily Activity     Outcome Measure   Help from another person eating meals?: A Little Help from another person taking care of personal grooming?: A Little Help from another person toileting, which includes using toliet, bedpan, or urinal?: Total Help from  another person bathing (including washing, rinsing, drying)?: A Lot Help from another person to put on and taking off regular upper body clothing?: A Little Help from another person to put on and taking off regular lower body clothing?: A Lot 6 Click Score: 14    End of Session Equipment Utilized During Treatment: Gait belt;Oxygen;Other (comment) (stedy)  OT Visit Diagnosis: Unsteadiness on feet (R26.81);Other abnormalities of gait and mobility (R26.89);Muscle weakness (generalized) (M62.81);Other (comment)   Activity Tolerance Patient tolerated treatment well   Patient Left in chair;with call bell/phone within reach;with chair alarm set;Other (comment) (hoyer pad in place if needed)   Nurse Communication Other (comment);Patient requests pain meds (PT alerted RN of medication requests)        Time: [phone] OT Time Calculation (min): 23 min  Charges: OT General Charges $OT Visit: 1 Visit OT Treatments $Self Care/Home Management : 8-22 mins  David [name], COTA/L Acute Rehabilitation [phone]   M,  David Tickner [place]-COTA/L 05/22/2023, 10:29 AM

## 2023-05-23 DIAGNOSIS — I5043 Acute on chronic combined systolic (congestive) and diastolic (congestive) heart failure: Secondary | ICD-10-CM | POA: Diagnosis not present

## 2023-05-23 LAB — CBC
HCT: 25.7 % — ABNORMAL LOW (ref 39.0–52.0)
Hemoglobin: 8 g/dL — ABNORMAL LOW (ref 13.0–17.0)
MCH: 33.5 pg (ref 26.0–34.0)
MCHC: 31.1 g/dL (ref 30.0–36.0)
MCV: 107.5 fL — ABNORMAL HIGH (ref 80.0–100.0)
Platelets: 343 10*3/uL (ref 150–400)
RBC: 2.39 MIL/uL — ABNORMAL LOW (ref 4.22–5.81)
RDW: 15.5 % (ref 11.5–15.5)
WBC: 12.5 10*3/uL — ABNORMAL HIGH (ref 4.0–10.5)
nRBC: 0 % (ref 0.0–0.2)

## 2023-05-23 LAB — GLUCOSE, CAPILLARY
Glucose-Capillary: 131 mg/dL — ABNORMAL HIGH (ref 70–99)
Glucose-Capillary: 197 mg/dL — ABNORMAL HIGH (ref 70–99)
Glucose-Capillary: 178 mg/dL — ABNORMAL HIGH (ref 70–99)
Glucose-Capillary: 244 mg/dL — ABNORMAL HIGH (ref 70–99)

## 2023-05-23 LAB — BASIC METABOLIC PANEL
Anion gap: 9 (ref 5–15)
BUN: 55 mg/dL — ABNORMAL HIGH (ref 8–23)
CO2: 31 mmol/L (ref 22–32)
Calcium: 8.7 mg/dL — ABNORMAL LOW (ref 8.9–10.3)
Chloride: 98 mmol/L (ref 98–111)
Creatinine, Ser: 2.74 mg/dL — ABNORMAL HIGH (ref 0.61–1.24)
GFR, Estimated: 23 mL/min — ABNORMAL LOW (ref >60)
Glucose, Bld: 133 mg/dL — ABNORMAL HIGH (ref 70–99)
Potassium: 4.3 mmol/L (ref 3.5–5.1)
Sodium: 138 mmol/L (ref 135–145)

## 2023-05-23 MED ORDER — TORSEMIDE 20 MG PO TABS
20.0000 mg | ORAL_TABLET | Freq: Two times a day (BID) | ORAL | Status: DC
  Administered 2023-05-24 – 2023-05-25 (×3): 20 mg via ORAL
  Filled 2023-05-23 (×3): qty 1

## 2023-05-23 NOTE — Plan of Care (Signed)
  Problem: Education: Goal: Ability to describe self-care measures that may prevent or decrease complications (Diabetes Survival Skills Education) will improve Outcome: Progressing   Problem: Nutritional: Goal: Maintenance of adequate nutrition will improve Outcome: Progressing   Problem: Education: Goal: Knowledge of General Education information will improve Description: Including pain rating scale, medication(s)/side effects and non-pharmacologic comfort measures Outcome: Progressing

## 2023-05-23 NOTE — Progress Notes (Signed)
 PROGRESS NOTE    David [name]  FMW:[id number] DOB: 1946-03-30 DOA: 05/11/2023 PCP: Rexanne Ingle, MD   78 y.o. male patient with a PMHx of CAD (PCI), HTN, dyslipidemia, chronic systolic/diastolic CHF (May 05, 2023: Echo EF 40-45%), pulmonary hypertension (51 mmHg), anemia of chronic illness, DM-2, neuropathy, CKD-4, and possible OSA who was discharged 12/23 for CHF exacerbation, acute hypoxic resp failure, and PNA declined thoracentesis then, who presented from NH after being there for 4 hrs with acute respiratory distress. when EMS arrived, he was saturating in the 60s, and they put him on CPAP and he only improved to the 70s. He failed NRM.  tachycardic, and hypertensive to 140s 150s with tachypnea accessory muscle use and respiratory failure despite CPAP ventilation. He became lethargic, so he was intubated.[name] He quit smoking many yrs ago and he smoked few cigarettes daily.    Significant Events 12/24 Intubated on Propofol   12/24 thoracentesis -Transferred from PCCM to [facility] service, slowly improving with diuresis -Complicated by AKI/CKD, slowly improving    Subjective: Better spirits, feels better today, finally agrees to leave the hospital but will not be before Monday  Assessment and Plan:  Acute Hypoxic Respiratory Failure Due to pneumonia, pleural effusion, and volume overload/pulmonary edema, as below -12/31 >with URI symptoms, nebs, Mucinex  -Improving with supportive care  Acute chronic combined CHF, RV failure Pulmonary hypertension -Last echo 12/24 with a EF 40-45%, moderately reduced RV, moderately elevated PA systolic pressures -Volume overloaded on admission, hypoxic, slowly improving -Diuresed aggressively with IV Lasix , weight down 28 LB, changed to oral torsemide , continue BiDil ,  -Decrease torsemide  dose with uptrending creatinine,-GDMT limited by CKD 4 -PT OT following, unable to stand or walk, SNF recommended> patient adamantly declines this, plan for home  health services  Hospital Acquired Pneumonia -Completed 5 days of cefepime  -Respiratory culture with Staph epidermidis, likely contaminant Blood cx NGTD x3  Suspect undiagnosed COPD -Former smoker quit few years ago   Transudative Pleural Effusion   1.9 L removed on 12/24 Cytology without malignant cells, cx with Ngx3 -Improved on repeat x-rays   AKI on CKD IV Baseline creatinine around 2.5-3 -Torsemide  dose decreased, see discussion above  Acute on chronic anemia -Hemoglobin has been in the 7.1 range last few days, anemia panel suggestive of chronic disease, Epogen given 12/30 -hb was 6.9 on 12/31, transfused 1 unit of PRBC, anemia panel suggestive of chronic disease, repeat epo given   Coronary Artery Disease Aspirin , statin   PAD History of multiple toe amputations, follow-up with vascular surgery -Continue aspirin  and statin - previously declined angiogram as recommended by vascular  Type 2 Diabetes Stable, SSI   Peripheral Neuropathy   Suspected OSA Needs outpatient sleep study    DVT prophylaxis: heparin  Code Status: full Family Communication: none Disposition Plan: Needs SNF, declines this, home with home health services  Consultants:    Procedures:   Antimicrobials:    Objective: Vitals:   05/22/23 2259 05/23/23 0132 05/23/23 0522 05/23/23 0725  BP:  139/74 (!) 147/69 (!) 156/88  Pulse:   76 70  Resp: 11 15 20 16   Temp: 98.7 F (37.1 C) 97.9 F (36.6 C) 97.9 F (36.6 C) 98 F (36.7 C)  TempSrc: Oral Oral Oral Oral  SpO2:  96% 97% 96%  Weight:   95 kg   Height:        Intake/Output Summary (Last 24 hours) at 05/23/2023 1137 Last data filed at 05/23/2023 0758 Gross per 24 hour  Intake 480 ml  Output  2050 ml  Net -1570 ml   Filed Weights   05/21/23 0451 05/22/23 0342 05/23/23 0522  Weight: 91.2 kg 91.7 kg 95 kg    Examination:  General exam: Chronically ill male sitting up in bed, AAOx3 HEENT: No JVD CVS: S1-S2, regular  rhythm Lungs: Improving air movement, rare scattered rhonchi Abd: nondistended, soft and nontender.Normal bowel sounds heard. Extremities: Chronic ischemic skin changes in both feet, amputation of multiple toes Skin: As above  Data Reviewed:   CBC: Recent Labs  Lab 05/17/23 1449 05/18/23 0246 05/19/23 0304 05/20/23 0251 05/21/23 0240 05/22/23 0312 05/23/23 0225  WBC 8.6   < > 8.9 10.6* 10.6* 9.8 12.5*  NEUTROABS 6.7  --   --   --   --   --   --   HGB 7.5*   < > 6.9* 7.5* 7.7* 7.5* 8.0*  HCT 24.0*   < > 22.2* 24.2* 24.4* 24.3* 25.7*  MCV 108.6*   < > 109.9* 107.6* 105.6* 108.5* 107.5*  PLT 297   < > 255 289 307 320 343   < > = values in this interval not displayed.   Basic Metabolic Panel: Recent Labs  Lab 05/19/23 0304 05/20/23 0251 05/21/23 0240 05/22/23 0312 05/23/23 0225  NA 140 137 140 137 138  K 4.2 3.9 3.8 4.2 4.3  CL 106 101 102 100 98  CO2 26 28 29 28 31   GLUCOSE 217* 265* 168* 219* 133*  BUN 44* 45* 46* 52* 55*  CREATININE 2.36* 2.41* 2.17* 2.72* 2.74*  CALCIUM  8.4* 8.3* 8.4* 8.3* 8.7*   GFR: Estimated Creatinine Clearance: 27 mL/min (A) (by C-G formula based on SCr of 2.74 mg/dL (H)). Liver Function Tests: Recent Labs  Lab 05/17/23 1449  AST 30  ALT 21  ALKPHOS 222*  BILITOT 0.6  PROT 7.4  ALBUMIN  2.4*   No results for input(s): LIPASE, AMYLASE in the last 168 hours. No results for input(s): AMMONIA in the last 168 hours. Coagulation Profile: No results for input(s): INR, PROTIME in the last 168 hours.  Cardiac Enzymes: No results for input(s): CKTOTAL, CKMB, CKMBINDEX, TROPONINI in the last 168 hours. BNP (last 3 results) No results for input(s): PROBNP in the last 8760 hours. HbA1C: No results for input(s): HGBA1C in the last 72 hours. CBG: Recent Labs  Lab 05/21/23 2130 05/22/23 0605 05/22/23 1130 05/22/23 1653 05/23/23 0705  GLUCAP 198* 192* 160* 102* 131*   Lipid Profile: No results for input(s):  CHOL, HDL, LDLCALC, TRIG, CHOLHDL, LDLDIRECT in the last 72 hours. Thyroid  Function Tests: No results for input(s): TSH, T4TOTAL, FREET4, T3FREE, THYROIDAB in the last 72 hours. Anemia Panel: No results for input(s): VITAMINB12, FOLATE, FERRITIN, TIBC, IRON , RETICCTPCT in the last 72 hours.  Urine analysis:    Component Value Date/Time   COLORURINE YELLOW 05/12/2023 0022   APPEARANCEUR HAZY (A) 05/12/2023 0022   LABSPEC 1.017 05/12/2023 0022   PHURINE 5.0 05/12/2023 0022   GLUCOSEU NEGATIVE 05/12/2023 0022   HGBUR SMALL (A) 05/12/2023 0022   BILIRUBINUR NEGATIVE 05/12/2023 0022   KETONESUR NEGATIVE 05/12/2023 0022   PROTEINUR 100 (A) 05/12/2023 0022   UROBILINOGEN 1.0 02/13/2015 1934   NITRITE NEGATIVE 05/12/2023 0022   LEUKOCYTESUR NEGATIVE 05/12/2023 0022   Sepsis Labs: @LABRCNTIP (procalcitonin:4,lacticidven:4)  ) No results found for this or any previous visit (from the past 240 hours).    Radiology Studies: No results found.    Scheduled Meds:  aspirin  EC  81 mg Oral Daily   atorvastatin   40  mg Oral Daily   Chlorhexidine  Gluconate Cloth  6 each Topical Daily   darbepoetin (ARANESP ) injection - NON-DIALYSIS  60 mcg Subcutaneous Once   feeding supplement  237 mL Oral BID BM   ferrous sulfate   325 mg Oral Q breakfast   Gerhardt's butt cream   Topical BID   guaiFENesin   600 mg Oral BID   heparin   5,000 Units Subcutaneous Q8H   hydrocerin   Topical BID   insulin  aspart  0-15 Units Subcutaneous TID WC   insulin  aspart  0-5 Units Subcutaneous QHS   insulin  glargine-yfgn  15 Units Subcutaneous Daily   ipratropium-albuterol   3 mL Nebulization BID   isosorbide -hydrALAZINE   2 tablet Oral TID   leptospermum manuka honey  1 Application Topical Daily   [START ON 05/24/2023] torsemide   20 mg Oral BID   traZODone   100 mg Oral QHS   Continuous Infusions:   LOS: 11 days    Time spent:    Sigurd Pac, MD [facility]   05/23/2023, 11:37 AM

## 2023-05-24 DIAGNOSIS — I5043 Acute on chronic combined systolic (congestive) and diastolic (congestive) heart failure: Secondary | ICD-10-CM | POA: Diagnosis not present

## 2023-05-24 LAB — BASIC METABOLIC PANEL
Anion gap: 10 (ref 5–15)
BUN: 54 mg/dL — ABNORMAL HIGH (ref 8–23)
CO2: 31 mmol/L (ref 22–32)
Calcium: 8.4 mg/dL — ABNORMAL LOW (ref 8.9–10.3)
Chloride: 94 mmol/L — ABNORMAL LOW (ref 98–111)
Creatinine, Ser: 2.59 mg/dL — ABNORMAL HIGH (ref 0.61–1.24)
GFR, Estimated: 25 mL/min — ABNORMAL LOW (ref >60)
Glucose, Bld: 174 mg/dL — ABNORMAL HIGH (ref 70–99)
Potassium: 4.1 mmol/L (ref 3.5–5.1)
Sodium: 135 mmol/L (ref 135–145)

## 2023-05-24 LAB — CBC
HCT: 25.9 % — ABNORMAL LOW (ref 39.0–52.0)
Hemoglobin: 8.2 g/dL — ABNORMAL LOW (ref 13.0–17.0)
MCH: 33.7 pg (ref 26.0–34.0)
MCHC: 31.7 g/dL (ref 30.0–36.0)
MCV: 106.6 fL — ABNORMAL HIGH (ref 80.0–100.0)
Platelets: 361 10*3/uL (ref 150–400)
RBC: 2.43 MIL/uL — ABNORMAL LOW (ref 4.22–5.81)
RDW: 15.2 % (ref 11.5–15.5)
WBC: 10.9 10*3/uL — ABNORMAL HIGH (ref 4.0–10.5)
nRBC: 0 % (ref 0.0–0.2)

## 2023-05-24 LAB — GLUCOSE, CAPILLARY
Glucose-Capillary: 148 mg/dL — ABNORMAL HIGH (ref 70–99)
Glucose-Capillary: 167 mg/dL — ABNORMAL HIGH (ref 70–99)
Glucose-Capillary: 228 mg/dL — ABNORMAL HIGH (ref 70–99)
Glucose-Capillary: 207 mg/dL — ABNORMAL HIGH (ref 70–99)

## 2023-05-24 MED ORDER — IPRATROPIUM-ALBUTEROL 0.5-2.5 (3) MG/3ML IN SOLN: 3.0000 mL | Freq: Four times a day (QID) | RESPIRATORY_TRACT | Status: DC | PRN

## 2023-05-24 MED ORDER — POTASSIUM CHLORIDE CRYS ER 20 MEQ PO TBCR
40.0000 meq | EXTENDED_RELEASE_TABLET | Freq: Once | ORAL | Status: AC
  Administered 2023-05-24: 40 meq via ORAL
  Filled 2023-05-24: qty 2

## 2023-05-24 NOTE — Progress Notes (Signed)
 Pt w/ essn clear BBS; states he does not use nebs at home; change to duo prn per rt assessment

## 2023-05-24 NOTE — Plan of Care (Signed)

## 2023-05-24 NOTE — Progress Notes (Signed)
 PROGRESS NOTE    David [name]  FMW:[id number] DOB: October 31, 1945 DOA: 05/11/2023 PCP: Rexanne Ingle, MD   78 y.o. male patient with a PMHx of CAD (PCI), HTN, dyslipidemia, chronic systolic/diastolic CHF (May 05, 2023: Echo EF 40-45%), pulmonary hypertension (51 mmHg), anemia of chronic illness, DM-2, neuropathy, CKD-4, and possible OSA who was discharged 12/23 for CHF exacerbation, acute hypoxic resp failure, and PNA declined thoracentesis then, who presented from NH after being there for 4 hrs with acute respiratory distress. when EMS arrived, he was saturating in the 60s, and they put him on CPAP and he only improved to the 70s. He failed NRM.  tachycardic, and hypertensive to 140s 150s with tachypnea accessory muscle use and respiratory failure despite CPAP ventilation. He became lethargic, so he was intubated.[name] He quit smoking many yrs ago and he smoked few cigarettes daily.    Significant Events 12/24 Intubated on Propofol   12/24 thoracentesis -Transferred from PCCM to [facility] service, slowly improving with diuresis -Complicated by AKI/CKD, slowly improving    Subjective: Feels better overall, mild cough but overall breathing better  Assessment and Plan:  Acute Hypoxic Respiratory Failure Due to pneumonia, pleural effusion, and volume overload/pulmonary edema, as below -12/31 >with URI symptoms, nebs, Mucinex  -Improved with supportive care  Acute chronic combined CHF, RV failure Pulmonary hypertension -Last echo 12/24 with a EF 40-45%, moderately reduced RV, moderately elevated PA systolic pressures -Volume overloaded on admission, hypoxic, slowly improving -Diuresed aggressively with IV Lasix , weight down 28 LB, changed to oral torsemide , continue BiDil ,  -Some mild temporarily held with worsening creatinine, restarted torsemide  -GDMT limited by CKD 4 -PT OT following, unable to stand or walk, SNF recommended> patient adamantly declines this, plan for home health services  tomorrow  Hospital Acquired Pneumonia -Completed 5 days of cefepime  -Respiratory culture with Staph epidermidis, likely contaminant Blood cx NGTD x3  Suspect undiagnosed COPD -Former smoker quit few years ago   Transudative Pleural Effusion   1.9 L removed on 12/24 Cytology without malignant cells, cx with Ngx3 -Improved on repeat x-rays   AKI on CKD IV Baseline creatinine around 2.5-3 -Torsemide  dose decreased, see discussion above  Acute on chronic anemia -Hemoglobin has been in the 7.1 range last few days, anemia panel suggestive of chronic disease, Epogen given 12/30 -hb was 6.9 on 12/31, transfused 1 unit of PRBC, anemia panel suggestive of chronic disease, repeat epo given   Coronary Artery Disease Aspirin , statin   PAD History of multiple toe amputations, follow-up with vascular surgery -Continue aspirin  and statin - previously declined angiogram as recommended by vascular  Type 2 Diabetes Stable, SSI   Peripheral Neuropathy   Suspected OSA Needs outpatient sleep study    DVT prophylaxis: heparin  Code Status: full Family Communication: none Disposition Plan: Needs SNF, declines this, home with home health services tomorrow  Consultants:    Procedures:   Antimicrobials:    Objective: Vitals:   05/23/23 1956 05/24/23 0007 05/24/23 0607 05/24/23 1000  BP: (!) 131/57 138/64 139/65 (!) 128/56  Pulse: 69  65   Resp: 19 19 19 17   Temp: 99 F (37.2 C) 98.9 F (37.2 C) 98.9 F (37.2 C) 98 F (36.7 C)  TempSrc: Oral Oral Oral   SpO2: 100% 98% 100%   Weight:   94.9 kg   Height:        Intake/Output Summary (Last 24 hours) at 05/24/2023 1050 Last data filed at 05/24/2023 0918 Gross per 24 hour  Intake 600 ml  Output 2050 ml  Net -1450 ml   Filed Weights   05/22/23 0342 05/23/23 0522 05/24/23 0607  Weight: 91.7 kg 95 kg 94.9 kg    Examination:  General exam: Chronically ill male sitting up in bed, AAOx3 HEENT: No JVD CVS: S1-S2, regular  rhythm Lungs: Improving air movement, rare basilar rhonchi  Abd: nondistended, soft and nontender.Normal bowel sounds heard. Extremities: Chronic ischemic skin changes in both feet, amputation of multiple toes Skin: As above  Data Reviewed:   CBC: Recent Labs  Lab 05/17/23 1449 05/18/23 0246 05/20/23 0251 05/21/23 0240 05/22/23 0312 05/23/23 0225 05/24/23 0233  WBC 8.6   < > 10.6* 10.6* 9.8 12.5* 10.9*  NEUTROABS 6.7  --   --   --   --   --   --   HGB 7.5*   < > 7.5* 7.7* 7.5* 8.0* 8.2*  HCT 24.0*   < > 24.2* 24.4* 24.3* 25.7* 25.9*  MCV 108.6*   < > 107.6* 105.6* 108.5* 107.5* 106.6*  PLT 297   < > 289 307 320 343 361   < > = values in this interval not displayed.   Basic Metabolic Panel: Recent Labs  Lab 05/20/23 0251 05/21/23 0240 05/22/23 0312 05/23/23 0225 05/24/23 0233  NA 137 140 137 138 135  K 3.9 3.8 4.2 4.3 4.1  CL 101 102 100 98 94*  CO2 28 29 28 31 31   GLUCOSE 265* 168* 219* 133* 174*  BUN 45* 46* 52* 55* 54*  CREATININE 2.41* 2.17* 2.72* 2.74* 2.59*  CALCIUM  8.3* 8.4* 8.3* 8.7* 8.4*   GFR: Estimated Creatinine Clearance: 28.5 mL/min (A) (by C-G formula based on SCr of 2.59 mg/dL (H)). Liver Function Tests: Recent Labs  Lab 05/17/23 1449  AST 30  ALT 21  ALKPHOS 222*  BILITOT 0.6  PROT 7.4  ALBUMIN  2.4*   No results for input(s): LIPASE, AMYLASE in the last 168 hours. No results for input(s): AMMONIA in the last 168 hours. Coagulation Profile: No results for input(s): INR, PROTIME in the last 168 hours.  Cardiac Enzymes: No results for input(s): CKTOTAL, CKMB, CKMBINDEX, TROPONINI in the last 168 hours. BNP (last 3 results) No results for input(s): PROBNP in the last 8760 hours. HbA1C: No results for input(s): HGBA1C in the last 72 hours. CBG: Recent Labs  Lab 05/23/23 0705 05/23/23 1122 05/23/23 1548 05/23/23 2108 05/24/23 0609  GLUCAP 131* 197* 178* 244* 148*   Lipid Profile: No results for input(s):  CHOL, HDL, LDLCALC, TRIG, CHOLHDL, LDLDIRECT in the last 72 hours. Thyroid  Function Tests: No results for input(s): TSH, T4TOTAL, FREET4, T3FREE, THYROIDAB in the last 72 hours. Anemia Panel: No results for input(s): VITAMINB12, FOLATE, FERRITIN, TIBC, IRON , RETICCTPCT in the last 72 hours.  Urine analysis:    Component Value Date/Time   COLORURINE YELLOW 05/12/2023 0022   APPEARANCEUR HAZY (A) 05/12/2023 0022   LABSPEC 1.017 05/12/2023 0022   PHURINE 5.0 05/12/2023 0022   GLUCOSEU NEGATIVE 05/12/2023 0022   HGBUR SMALL (A) 05/12/2023 0022   BILIRUBINUR NEGATIVE 05/12/2023 0022   KETONESUR NEGATIVE 05/12/2023 0022   PROTEINUR 100 (A) 05/12/2023 0022   UROBILINOGEN 1.0 02/13/2015 1934   NITRITE NEGATIVE 05/12/2023 0022   LEUKOCYTESUR NEGATIVE 05/12/2023 0022   Sepsis Labs: @LABRCNTIP (procalcitonin:4,lacticidven:4)  ) No results found for this or any previous visit (from the past 240 hours).    Radiology Studies: No results found.    Scheduled Meds:  aspirin  EC  81 mg Oral Daily   atorvastatin   40 mg Oral  Daily   Chlorhexidine  Gluconate Cloth  6 each Topical Daily   feeding supplement  237 mL Oral BID BM   ferrous sulfate   325 mg Oral Q breakfast   Gerhardt's butt cream   Topical BID   guaiFENesin   600 mg Oral BID   heparin   5,000 Units Subcutaneous Q8H   hydrocerin   Topical BID   insulin  aspart  0-15 Units Subcutaneous TID WC   insulin  aspart  0-5 Units Subcutaneous QHS   insulin  glargine-yfgn  15 Units Subcutaneous Daily   isosorbide -hydrALAZINE   2 tablet Oral TID   leptospermum manuka honey  1 Application Topical Daily   torsemide   20 mg Oral BID   traZODone   100 mg Oral QHS   Continuous Infusions:   LOS: 12 days    Time spent:    Sigurd Pac, MD [facility]   05/24/2023, 10:50 AM

## 2023-05-25 ENCOUNTER — Other Ambulatory Visit (HOSPITAL_COMMUNITY): Payer: Self-pay

## 2023-05-25 DIAGNOSIS — I5043 Acute on chronic combined systolic (congestive) and diastolic (congestive) heart failure: Secondary | ICD-10-CM | POA: Diagnosis not present

## 2023-05-25 LAB — CBC
HCT: 25.2 % — ABNORMAL LOW (ref 39.0–52.0)
Hemoglobin: 7.8 g/dL — ABNORMAL LOW (ref 13.0–17.0)
MCH: 33.6 pg (ref 26.0–34.0)
MCHC: 31 g/dL (ref 30.0–36.0)
MCV: 108.6 fL — ABNORMAL HIGH (ref 80.0–100.0)
Platelets: 376 10*3/uL (ref 150–400)
RBC: 2.32 MIL/uL — ABNORMAL LOW (ref 4.22–5.81)
RDW: 15.5 % (ref 11.5–15.5)
WBC: 11.3 10*3/uL — ABNORMAL HIGH (ref 4.0–10.5)
nRBC: 0 % (ref 0.0–0.2)

## 2023-05-25 LAB — BASIC METABOLIC PANEL
Anion gap: 10 (ref 5–15)
BUN: 55 mg/dL — ABNORMAL HIGH (ref 8–23)
CO2: 30 mmol/L (ref 22–32)
Calcium: 8.3 mg/dL — ABNORMAL LOW (ref 8.9–10.3)
Chloride: 97 mmol/L — ABNORMAL LOW (ref 98–111)
Creatinine, Ser: 2.59 mg/dL — ABNORMAL HIGH (ref 0.61–1.24)
GFR, Estimated: 25 mL/min — ABNORMAL LOW (ref >60)
Glucose, Bld: 125 mg/dL — ABNORMAL HIGH (ref 70–99)
Potassium: 4.4 mmol/L (ref 3.5–5.1)
Sodium: 137 mmol/L (ref 135–145)

## 2023-05-25 LAB — GLUCOSE, CAPILLARY
Glucose-Capillary: 103 mg/dL — ABNORMAL HIGH (ref 70–99)
Glucose-Capillary: 130 mg/dL — ABNORMAL HIGH (ref 70–99)

## 2023-05-25 MED ORDER — POLYETHYLENE GLYCOL 3350 17 GM/SCOOP PO POWD
17.0000 g | Freq: Every day | ORAL | 0 refills | Status: DC
  Filled 2023-05-25: qty 238, 14d supply, fill #0

## 2023-05-25 MED ORDER — TRAZODONE HCL 50 MG PO TABS
50.0000 mg | ORAL_TABLET | Freq: Every evening | ORAL | 0 refills | Status: DC | PRN
  Filled 2023-05-25: qty 20, 20d supply, fill #0

## 2023-05-25 MED ORDER — BLOOD GLUCOSE TEST VI STRP
1.0000 | ORAL_STRIP | Freq: Three times a day (TID) | 0 refills | Status: DC
  Filled 2023-05-25: qty 100, 34d supply, fill #0

## 2023-05-25 MED ORDER — TORSEMIDE 20 MG PO TABS
20.0000 mg | ORAL_TABLET | Freq: Two times a day (BID) | ORAL | 1 refills | Status: DC
  Filled 2023-05-25: qty 60, 30d supply, fill #0

## 2023-05-25 MED ORDER — BD PEN NEEDLE NANO U/F 32G X 4 MM MISC
1.0000 | Freq: Three times a day (TID) | 0 refills | Status: DC
  Filled 2023-05-25: qty 100, 33d supply, fill #0

## 2023-05-25 MED ORDER — BLOOD GLUCOSE MONITOR SYSTEM W/DEVICE KIT
1.0000 | PACK | Freq: Three times a day (TID) | 0 refills | Status: DC
  Filled 2023-05-25: qty 1, 30d supply, fill #0

## 2023-05-25 MED ORDER — ISOSORB DINITRATE-HYDRALAZINE 20-37.5 MG PO TABS
2.0000 | ORAL_TABLET | Freq: Three times a day (TID) | ORAL | 1 refills | Status: DC
  Filled 2023-05-25: qty 180, 30d supply, fill #0

## 2023-05-25 MED ORDER — LANCET DEVICE MISC
1.0000 | Freq: Three times a day (TID) | 0 refills | Status: DC
  Filled 2023-05-25: qty 1, fill #0

## 2023-05-25 MED ORDER — LANCETS MISC
1.0000 | Freq: Three times a day (TID) | 0 refills | Status: DC
  Filled 2023-05-25: qty 100, 33d supply, fill #0

## 2023-05-25 MED ORDER — HYDROCODONE-ACETAMINOPHEN 5-325 MG PO TABS
1.0000 | ORAL_TABLET | Freq: Once | ORAL | Status: AC
  Administered 2023-05-25: 1 via ORAL
  Filled 2023-05-25: qty 1

## 2023-05-25 MED ORDER — HYDROCODONE-ACETAMINOPHEN 5-325 MG PO TABS
1.0000 | ORAL_TABLET | Freq: Four times a day (QID) | ORAL | 0 refills | Status: DC | PRN
  Filled 2023-05-25: qty 30, 7d supply, fill #0

## 2023-05-25 MED ORDER — INSULIN GLARGINE 100 UNIT/ML SOLOSTAR PEN
12.0000 [IU] | PEN_INJECTOR | Freq: Every day | SUBCUTANEOUS | 1 refills | Status: DC
  Filled 2023-05-25: qty 3, 25d supply, fill #0

## 2023-05-25 NOTE — TOC Transition Note (Addendum)
 Transition of Care [facility]) - Discharge Note   Patient Details  Name: David [name] MRN: [id number] Date of Birth: [birth year]  [facility] CM/SW Contact:  Waddell Barnie Rama, RN Phone Number: 05/25/2023, 12:08 PM   Clinical Narrative:    For dc today, patient does not have preference for DME agency, NCM made referral to Jermaine with [employer] for home oxygen.  Patient will be going home by ptar.  Will need the oxygen to be delivered to home before can by PTAR.  NCM notified Cindie with [employer] of dc today.  Patient was adamant about going home with [facility] services instead of going to SNF which the doctor and NCM told him he needed.  NCM will schedule PTAR once oxygen has been delivered to home  1443- oxygen has been deliverd, PTAR scheduled.          Patient Goals and CMS Choice            Discharge Placement                       Discharge Plan and Services Additional resources added to the After Visit Summary for                                       Social Drivers of Health (SDOH) Interventions SDOH Screenings   Food Insecurity: No Food Insecurity (05/14/2023)  Housing: Low Risk  (05/18/2023)  Transportation Needs: No Transportation Needs (05/18/2023)  Recent Concern: Transportation Needs - Unmet Transportation Needs (05/05/2023)  Utilities: Not At Risk (05/14/2023)  Alcohol Screen: Low Risk  (05/18/2023)  Financial Resource Strain: Low Risk  (05/18/2023)  Physical Activity: Inactive (08/20/2017)  Social Connections: Socially Integrated (05/19/2023)  Stress: No Stress Concern Present (08/20/2017)  Tobacco Use: Medium Risk (05/14/2023)     Readmission Risk Interventions     No data to display

## 2023-05-25 NOTE — Plan of Care (Signed)
  Problem: Education: Goal: Ability to describe self-care measures that may prevent or decrease complications (Diabetes Survival Skills Education) will improve Outcome: Adequate for Discharge Goal: Individualized Educational Video(s) Outcome: Adequate for Discharge   Problem: Coping: Goal: Ability to adjust to condition or change in health will improve Outcome: Adequate for Discharge   Problem: Fluid Volume: Goal: Ability to maintain a balanced intake and output will improve Outcome: Adequate for Discharge   Problem: Health Behavior/Discharge Planning: Goal: Ability to identify and utilize available resources and services will improve Outcome: Adequate for Discharge Goal: Ability to manage health-related needs will improve Outcome: Adequate for Discharge   Problem: Metabolic: Goal: Ability to maintain appropriate glucose levels will improve Outcome: Adequate for Discharge   Problem: Nutritional: Goal: Maintenance of adequate nutrition will improve Outcome: Adequate for Discharge Goal: Progress toward achieving an optimal weight will improve Outcome: Adequate for Discharge   Problem: Skin Integrity: Goal: Risk for impaired skin integrity will decrease Outcome: Adequate for Discharge   Problem: Tissue Perfusion: Goal: Adequacy of tissue perfusion will improve Outcome: Adequate for Discharge   Problem: Education: Goal: Knowledge of General Education information will improve Description: Including pain rating scale, medication(s)/side effects and non-pharmacologic comfort measures Outcome: Adequate for Discharge   Problem: Health Behavior/Discharge Planning: Goal: Ability to manage health-related needs will improve Outcome: Adequate for Discharge   Problem: Clinical Measurements: Goal: Ability to maintain clinical measurements within normal limits will improve Outcome: Adequate for Discharge Goal: Will remain free from infection Outcome: Adequate for Discharge Goal:  Diagnostic test results will improve Outcome: Adequate for Discharge Goal: Respiratory complications will improve Outcome: Adequate for Discharge Goal: Cardiovascular complication will be avoided Outcome: Adequate for Discharge   Problem: Activity: Goal: Risk for activity intolerance will decrease Outcome: Adequate for Discharge   Problem: Nutrition: Goal: Adequate nutrition will be maintained Outcome: Adequate for Discharge   Problem: Coping: Goal: Level of anxiety will decrease Outcome: Adequate for Discharge   Problem: Elimination: Goal: Will not experience complications related to bowel motility Outcome: Adequate for Discharge Goal: Will not experience complications related to urinary retention Outcome: Adequate for Discharge   Problem: Pain Management: Goal: General experience of comfort will improve Outcome: Adequate for Discharge   Problem: Safety: Goal: Ability to remain free from injury will improve Outcome: Adequate for Discharge   Problem: Skin Integrity: Goal: Risk for impaired skin integrity will decrease Outcome: Adequate for Discharge   Problem: Acute Rehab PT Goals(only PT should resolve) Goal: Pt Will Go Supine/Side To Sit Outcome: Adequate for Discharge Goal: Pt Will Go Sit To Supine/Side Outcome: Adequate for Discharge Goal: Patient Will Transfer Sit To/From Stand Outcome: Adequate for Discharge Goal: Pt/caregiver will Perform Home Exercise Program Outcome: Adequate for Discharge   Problem: Acute Rehab OT Goals (only OT should resolve) Goal: Pt. Will Perform Grooming Outcome: Adequate for Discharge Goal: Pt. Will Transfer To Toilet Outcome: Adequate for Discharge Goal: Pt/Caregiver Will Perform Home Exercise Program Outcome: Adequate for Discharge Goal: OT Additional ADL Goal #2 Outcome: Adequate for Discharge   Problem: Increased Nutrient Needs (NI-5.1) Goal: Food and/or nutrient delivery Description: Individualized approach for  food/nutrient provision. Outcome: Adequate for Discharge

## 2023-05-25 NOTE — Progress Notes (Signed)
 SATURATION QUALIFICATIONS: (This note is used to comply with regulatory documentation for home oxygen)  Patient Saturations on Room Air at Rest = 88%  Patient Saturations on Room Air while Ambulating = N/A - patient unable to walk  Patient Saturations on 2 Liters of oxygen - 96%  Please briefly explain why patient needs home oxygen:

## 2023-05-25 NOTE — Discharge Summary (Signed)
 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 male patient with a PMHx of CAD (PCI), HTN, dyslipidemia, chronic systolic/diastolic CHF (May 05, 2023: Echo EF 40-45%), pulmonary hypertension (51 mmHg), anemia of chronic illness, DM-2, neuropathy, CKD-4, and possible OSA who was discharged 12/23 for CHF exacerbation, acute hypoxic resp failure, and PNA declined thoracentesis then, who presented from NH after being there for 4 hrs with acute respiratory distress. when EMS arrived, he was saturating in the 60s, and they put him on CPAP and he only improved to the 70s. He failed NRM.  tachycardic, and hypertensive to 140s 150s with tachypnea accessory muscle use and respiratory failure despite CPAP ventilation. He became lethargic, so he was intubated.[name] He quit smoking many yrs ago and he smoked few cigarettes daily.  12/24 Intubated on Propofol   12/24 thoracentesis -Transferred from PCCM to [facility] service, slowly improving with diuresis -Complicated by AKI/CKD, slowly  improving  Hospital Course:   Acute Hypoxic Respiratory Failure Due to pneumonia, pleural effusion, and volume overload/pulmonary edema, as below -12/31 >with URI symptoms, nebs, Mucinex  -Improved with supportive care -Set up with 2 L home O2 at discharge   Acute chronic combined CHF, RV failure Pulmonary hypertension -Last echo 12/24 with a EF 40-45%, moderately reduced RV, moderately elevated PA systolic pressures -Volume overloaded on admission, hypoxic, slowly improving -Diuresed aggressively with IV Lasix , weight down 28 LB, changed to oral torsemide , continue BiDil ,  -GDMT limited by CKD 4 -PT OT following, unable to stand or walk, SNF recommended> patient adamantly declines this, plan for home health services   Hospital Acquired Pneumonia -Completed 5 days of cefepime  -Respiratory culture with Staph epidermidis, likely contaminant Blood cx NGTD x3   Suspect undiagnosed COPD -Former smoker quit few years ago   Transudative Pleural Effusion   1.9 L removed on 12/24 Cytology without malignant cells, cx with Ngx3 -Improved on repeat x-rays   AKI on CKD IV Baseline creatinine around 2.5-3 -Torsemide  dose decreased, see discussion above   Acute on chronic anemia -Hemoglobin has been in the 7.1 range last few days, anemia panel suggestive of chronic disease, Epogen given 12/30 -hb was 6.9 on 12/31, transfused 1 unit of PRBC, anemia panel suggestive of chronic disease, repeat epo given   Coronary Artery Disease Aspirin , statin    PAD History of multiple toe amputations -Continue aspirin  and statin - previously declined angiogram as recommended by vascular -Recommended outpatient vascular surgery follow-up  Type 2 Diabetes Stable, SSI   Peripheral Neuropathy   Suspected OSA Needs outpatient sleep study  Discharge Exam: Vitals:   05/25/23 1046 05/25/23 1130  BP:  (!) 127/50  Pulse:  70  Resp:  19  Temp:  98 F (36.7 C)  SpO2: (!) 88% 94%   General exam:  Chronically ill male sitting up in bed, AAOx3 HEENT: No JVD CVS: S1-S2, regular rhythm Lungs: Improving air movement, rare basilar rhonchi  Abd: nondistended, soft and nontender.Normal bowel sounds heard. Extremities: Chronic ischemic skin changes in both feet, amputation of multiple toes Skin: As above  Discharge Instructions   Discharge Instructions     Diet - low sodium heart healthy   Complete by: As directed    Diet Carb Modified   Complete by: As directed    Discharge wound care:   Complete by: As directed    Sacral wound :cleanse wounds with normal saline, apply Medihoney to wound beds, cover with dry gauze and silicone foam Left heel wound cleanse with normal saline, apply Xeroform gauze daily, cover with dry gauze and Kirlex   Increase activity slowly   Complete by: As directed       Allergies as of 05/25/2023   No Known Allergies      Medication List     STOP taking these medications    amLODipine  10 MG tablet Commonly known as: NORVASC    furosemide  20 MG tablet Commonly known as: LASIX    insulin  aspart 100 UNIT/ML injection Commonly known as: novoLOG    metoprolol  succinate 100 MG 24 hr tablet Commonly known as: TOPROL -XL       TAKE these medications    Accu-Chek Guide Test test strip Generic drug: glucose blood Use as directed 3 (three) times daily to check blood sugar.   Accu-Chek Guide w/Device Kit Use as directed 3 (three) times daily to check blood sugar   Accu-Chek Softclix Lancets lancets Use as directed 3 (three) times daily to check blood sugar.   albuterol  108 (90 Base) MCG/ACT inhaler Commonly known as: VENTOLIN  HFA Inhale 2 puffs into the lungs every 6 (six) hours as needed for wheezing or shortness of breath. What changed: when to take this   aspirin  EC 81 MG tablet Take 81 mg by mouth daily.   atorvastatin  40 MG tablet Commonly known as: LIPITOR  Take 1 tablet (40 mg total) by mouth daily.   BD Pen Needle Nano U/F 32G X 4  MM Misc Generic drug: Insulin  Pen Needle Use as directed 3 (three) times daily.   ferrous sulfate  325 (65 FE) MG tablet Take 325 mg by mouth daily with breakfast.   HYDROcodone -acetaminophen  5-325 MG tablet Commonly known as: NORCO/VICODIN Take 1 tablet by mouth every 6 (six) hours as needed for severe pain (pain score 7-10).   isosorbide -hydrALAZINE  20-37.5 MG tablet Commonly known as: BIDIL  Take 2 tablets by mouth 3 (three) times daily.   Lancet Device Misc Use as directed 3 times daily to assist with checking blood sugar.   Lantus  SoloStar 100 UNIT/ML Solostar Pen Generic drug: insulin  glargine Inject 12 Units into the skin daily. Start taking on: May 26, 2023   latanoprost  0.005 % ophthalmic solution Commonly known as: XALATAN  Place 1 drop into both eyes at bedtime.   multivitamin with minerals Tabs tablet Take 1 tablet by mouth daily.   nitroGLYCERIN  0.4 MG SL tablet Commonly known as: NITROSTAT  Place 1 tablet (0.4 mg total) under the tongue every 5 (five) minutes as needed for chest pain.   polyethylene glycol powder 17 GM/SCOOP powder Commonly known as: GLYCOLAX /MIRALAX  Take 1 capful (17 g) by mouth daily.   torsemide  20 MG tablet Commonly known as: DEMADEX  Take 1 tablet (20 mg total) by mouth 2 (two)  times daily.   traZODone  50 MG tablet Commonly known as: DESYREL  Take 1 tablet (50 mg total) by mouth at bedtime as needed for sleep.               Durable Medical Equipment  (From admission, onward)           Start     Ordered   05/25/23 1207  For home use only DME oxygen  Once       Question Answer Comment  Length of Need 6 Months   Mode or (Route) Nasal cannula   Liters per Minute 2   Frequency Continuous (stationary and portable oxygen unit needed)   Oxygen conserving device Yes   Oxygen delivery system Gas      05/25/23 1206              Discharge Care Instructions  (From admission, onward)           Start     Ordered    05/25/23 0000  Discharge wound care:       Comments: Sacral wound :cleanse wounds with normal saline, apply Medihoney to wound beds, cover with dry gauze and silicone foam Left heel wound cleanse with normal saline, apply Xeroform gauze daily, cover with dry gauze and Kirlex   05/25/23 1004           No Known Allergies  Follow-up Information     [facility] Follow up.   Specialty: Home Health Services Why: Agency will call you to set up apt times Contact information: [address] [place] [address] [phone]         Rexanne Ingle, MD Follow up.   Specialty: Internal Medicine Why: Please follow up in a week. Contact information: [address] [place] [address] [phone]         [facility]. Go in 15 day(s).   Specialty: Cardiology Why: Hospital follow up 06/09/2023 @ 3 pm PLEASE bring a current medication list to appointment FREE valet parking, Entrance C, of [employer] information: [address] [place]  [phone]        [facility] Follow up.   Why: home oxygen Contact information: [phone]                 The results of significant diagnostics from this hospitalization (including imaging, microbiology, ancillary and laboratory) are listed below for reference.    Significant Diagnostic Studies: DG CHEST PORT 1 VIEW Result Date: 05/19/2023 CLINICAL DATA:  Cough for 3 days.  Shortness of breath EXAM: PORTABLE CHEST 1 VIEW COMPARISON:  05/17/2023 and older FINDINGS: Stable cardiopericardial silhouette. Calcified aorta. Overlapping cardiac leads. No pneumothorax. Moderate right effusion and tiny left, similar to previous. Adjacent lung opacities also noted. No edema. Degenerative changes along the spine. IMPRESSION: No significant interval change when adjusting for technique. Electronically Signed   By:  Ranell Bring M.D.   On: 05/19/2023 11:39   VAS US  LOWER EXTREMITY VENOUS (DVT) Result Date: 05/18/2023  Lower Venous DVT Study Patient Name:  ARTIN MCEUEN  Date of Exam:   05/17/2023 Medical Rec #: [id number]         Accession #:    [id number] Date of Birth: 1945/12/12        Patient Gender: M Patient Age:   33 years Exam Location:  [facility] Procedure:      VAS  US  LOWER EXTREMITY VENOUS (DVT) Referring Phys: A POWELL JR --------------------------------------------------------------------------------  Indications: Edema.  Comparison Study: Previous study on 11.14.2028. Performing Technologist: Edilia Elden Appl  Examination Guidelines: A complete evaluation includes B-mode imaging, spectral Doppler, color Doppler, and power Doppler as needed of all accessible portions of each vessel. Bilateral testing is considered an integral part of a complete examination. Limited examinations for reoccurring indications may be performed as noted. The reflux portion of the exam is performed with the patient in reverse Trendelenburg.  +---------+---------------+---------+-----------+----------+--------------+ RIGHT    CompressibilityPhasicitySpontaneityPropertiesThrombus Aging +---------+---------------+---------+-----------+----------+--------------+ CFV      Full           Yes      Yes                                 +---------+---------------+---------+-----------+----------+--------------+ SFJ      Full           Yes      Yes                                 +---------+---------------+---------+-----------+----------+--------------+ FV Prox  Full                                                        +---------+---------------+---------+-----------+----------+--------------+ FV Mid   Full                                                        +---------+---------------+---------+-----------+----------+--------------+ FV DistalFull                                                         +---------+---------------+---------+-----------+----------+--------------+ PFV      Full                                                        +---------+---------------+---------+-----------+----------+--------------+ POP      Full           Yes      Yes                                 +---------+---------------+---------+-----------+----------+--------------+ PTV      Full                                                        +---------+---------------+---------+-----------+----------+--------------+ PERO     Full                                                        +---------+---------------+---------+-----------+----------+--------------+   +---------+---------------+---------+-----------+----------+-------------------+  LEFT     CompressibilityPhasicitySpontaneityPropertiesThrombus Aging      +---------+---------------+---------+-----------+----------+-------------------+ CFV      Full           Yes      Yes                                      +---------+---------------+---------+-----------+----------+-------------------+ SFJ      Full           Yes      Yes                                      +---------+---------------+---------+-----------+----------+-------------------+ FV Prox  Full                                                             +---------+---------------+---------+-----------+----------+-------------------+ FV Mid   Full                                                             +---------+---------------+---------+-----------+----------+-------------------+ FV DistalFull                                                             +---------+---------------+---------+-----------+----------+-------------------+ PFV      Full                                                             +---------+---------------+---------+-----------+----------+-------------------+ POP      Full           Yes       Yes                                      +---------+---------------+---------+-----------+----------+-------------------+ PTV                                                   Limited, patent by                                                        color.              +---------+---------------+---------+-----------+----------+-------------------+ PERO     Full                                                             +---------+---------------+---------+-----------+----------+-------------------+  Summary: BILATERAL: - No evidence of deep vein thrombosis seen in the lower extremities, bilaterally. -No evidence of popliteal cyst, bilaterally.   *See table(s) above for measurements and observations. Electronically signed by Norman Serve on 05/18/2023 at 10:57:30 AM.    Final    DG CHEST PORT 1 VIEW Result Date: 05/17/2023 CLINICAL DATA:  Cough. EXAM: PORTABLE CHEST 1 VIEW COMPARISON:  May 12, 2023. FINDINGS: Stable cardiomediastinal silhouette. Endotracheal and nasogastric tubes have been removed. Bilateral lung opacities are noted, right greater than left, concerning for pneumonia possibly edema. Small right pleural effusion is noted which is slightly enlarged compared to prior exam. Bony thorax is unremarkable. IMPRESSION: Bilateral lung opacities are noted, right greater than left concerning for pneumonia or possibly edema. Small right pleural effusion is noted which is slightly enlarged compared to prior exam. Electronically Signed   By: Lynwood Landy Raddle M.D.   On: 05/17/2023 12:26   DG CHEST PORT 1 VIEW Result Date: 05/12/2023 CLINICAL DATA:  Pleural effusion.  Post thoracentesis. EXAM: PORTABLE CHEST 1 VIEW COMPARISON:  Radiographs 05/10/2022 and 05/08/2023.  CT 05/05/2023. FINDINGS: 1103 hours. Tip of the endotracheal tube is 4.2 cm above the carina, similar to previous study. Enteric tube projects below the diaphragm. Interval improvement in diffuse bilateral  airspace opacities which remain asymmetric to the right. The right pleural effusion appears slightly smaller. No evidence of pneumothorax. The heart size and mediastinal contours appear unchanged. IMPRESSION: Interval improvement in diffuse bilateral airspace opacities and right pleural effusion. No evidence of pneumothorax. Electronically Signed   By: Elsie Perone M.D.   On: 05/12/2023 14:57   US  RENAL Result Date: 05/12/2023 CLINICAL DATA:  CHF. EXAM: RENAL / URINARY TRACT ULTRASOUND COMPLETE COMPARISON:  None Available. FINDINGS: Right Kidney: Renal measurements: 9.2 cm x 5.1 cm x 4.7 cm = volume: 115.3 mL. Echogenicity within normal limits. No mass or hydronephrosis visualized. Left Kidney: Renal measurements: 9.9 cm x 5.4 cm x 5.1 cm = volume: 141.9 mL. Echogenicity within normal limits. No mass or hydronephrosis visualized. Bladder: Appears normal for degree of bladder distention. Other: None. IMPRESSION: Unremarkable renal ultrasound. Electronically Signed   By: Suzen Dials M.D.   On: 05/12/2023 03:29   DG Chest Portable 1 View Result Date: 05/12/2023 CLINICAL DATA:  Intubation EXAM: PORTABLE CHEST 1 VIEW COMPARISON:  05/08/2023 FINDINGS: endotracheal tube is 5 cm above the carina. Heart and mediastinal contours within normal limits. Diffuse bilateral airspace disease, right greater than left. Layering right pleural effusion. No acute bony abnormality. IMPRESSION: 1. Endotracheal tube 5 cm above the carina. 2. Diffuse bilateral airspace disease, right greater than left. This could reflect edema or infection. Layering right pleural effusion. Electronically Signed   By: Franky Crease M.D.   On: 05/12/2023 01:09   DG Abdomen 1 View Result Date: 05/12/2023 CLINICAL DATA:  OG tube placement EXAM: ABDOMEN - 1 VIEW COMPARISON:  08/10/2018 FINDINGS: OG tube tip is in the distal stomach. Significant gaseous distention of the stomach. Gas within nondistended large and small bowel. IMPRESSION: OG  tube tip in the distal stomach. Significant gaseous distention of the stomach. Electronically Signed   By: Franky Crease M.D.   On: 05/12/2023 01:08   DG CHEST PORT 1 VIEW Result Date: 05/08/2023 CLINICAL DATA:  Shortness of breath.  Pneumonia. EXAM: PORTABLE CHEST 1 VIEW COMPARISON:  CT chest dated May 05, 2023. FINDINGS: Stable cardiomediastinal silhouette. Moderate right pleural effusion with adjacent right lower lobe collapse/consolidation, not significantly changed. No pneumothorax. No acute osseous  abnormality. IMPRESSION: 1. Moderate right pleural effusion with adjacent right lower lobe collapse/consolidation, not significantly changed. Electronically Signed   By: Elsie ONEIDA Shoulder M.D.   On: 05/08/2023 10:33   DG Foot 2 Views Right Result Date: 05/06/2023 CLINICAL DATA:  Right foot wound.  Cellulitis.  History of diabetes. EXAM: RIGHT FOOT - 2 VIEW COMPARISON:  Right foot radiographs 06/14/2019 FINDINGS: There is a new lucency likely representing an ulcer at the skin surface of the dorsal midfoot at the level of the tarsometatarsal joints, measuring up to approximately 2 cm in longitudinal length of the right and 0.6 cm in craniocaudal depth. No definite cortical erosion is seen. Redemonstration of prior amputation of the phalanges of the first and second digits. Within the limitation of diffuse decreased bone mineralization, no definite new cortical erosion is seen to indicate radiographic evidence of acute osteomyelitis. Mild tarsometatarsal joint space narrowing diffusely. Mild atherosclerotic calcifications. IMPRESSION: 1. New lucency likely representing an ulcer at the skin surface of the dorsal midfoot at the level of the tarsometatarsal joints. No definite cortical erosion is seen to indicate radiographic evidence of acute osteomyelitis. 2. Redemonstration of prior amputation of the phalanges of the first and second digits. Electronically Signed   By: Tanda Lyons M.D.   On: 05/06/2023  12:16   MR FOOT LEFT WO CONTRAST Result Date: 05/06/2023 CLINICAL DATA:  Foot swelling, diabetic, osteomyelitis suspected. Left foot swelling without specific wound. EXAM: MRI OF THE LEFT FOOT WITHOUT CONTRAST TECHNIQUE: Multiplanar, multisequence MR imaging of the left foot was performed. No intravenous contrast was administered. COMPARISON:  Radiographs 05/05/2023 FINDINGS: Technical note: Despite efforts by the technologist and patient, mild motion artifact is present on today's exam and could not be eliminated. This reduces exam sensitivity and specificity. Bones/Joint/Cartilage Status post amputation through the proximal 5th metatarsal diaphysis. The amputation appears well-healed without cortical destruction or marrow edema. No evidence of osteomyelitis elsewhere in the foot. No evidence of acute fracture or dislocation. The bones are demineralized. There are moderate degenerative changes at the 1st metatarsophalangeal joint. The tibial sesamoid of the 1st metatarsal appears bipartite. No significant joint effusions. The alignment at the Lisfranc joint is normal. Ligaments The Lisfranc ligament is intact. The collateral ligaments of the metatarsophalangeal joints appear intact. Muscles and Tendons Generalized muscular atrophy and edema attributed to diabetes. Mild T2 hyperintensity surrounding the flexor tendons within the forefoot. No evidence of tendon disruption. Soft tissues Decreased T1 and T2 signal within the subcutaneous fat plantar to the 4th metatarsal head may reflect scarring or a pressure lesion. Additional probable pressure lesion plantar to the 1st metatarsal head. No focal skin ulceration or focal fluid collection identified. Nonspecific subcutaneous edema throughout the foot, greatest dorsally. IMPRESSION: 1. No evidence of osteomyelitis or abscess. 2. Status post amputation through the proximal 5th metatarsal diaphysis. 3. Nonspecific subcutaneous edema throughout the foot, greatest  dorsally. 4. Generalized muscular atrophy and edema attributed to diabetes. Electronically Signed   By: Elsie Perone M.D.   On: 05/06/2023 11:20   VAS US  ABI WITH/WO TBI Result Date: 05/05/2023  LOWER EXTREMITY DOPPLER STUDY Patient Name:  LORN BUTCHER  Date of Exam:   05/05/2023 Medical Rec #: [id number]         Accession #:    [id number] Date of Birth: 1945/10/26        Patient Gender: M Patient Age:   16 years Exam Location:  [facility] Procedure:      VAS US  ABI WITH/WO TBI Referring  Phys: ANASTASSIA DOUTOVA --------------------------------------------------------------------------------  Indications: Claudication. High Risk Factors: Hypertension, coronary artery disease.  Comparison Study: No prior exam. Performing Technologist: Edilia Elden Appl  Examination Guidelines: A complete evaluation includes at minimum, Doppler waveform signals and systolic blood pressure reading at the level of bilateral brachial, anterior tibial, and posterior tibial arteries, when vessel segments are accessible. Bilateral testing is considered an integral part of a complete examination. Photoelectric Plethysmograph (PPG) waveforms and toe systolic pressure readings are included as required and additional duplex testing as needed. Limited examinations for reoccurring indications may be performed as noted.  ABI Findings: +--------+------------------+-----+----------+--------+ Right   Rt Pressure (mmHg)IndexWaveform  Comment  +--------+------------------+-----+----------+--------+ [id number]                    triphasic          +--------+------------------+-----+----------+--------+ DP      157               1.08 monophasic         +--------+------------------+-----+----------+--------+ +---------+------------------+-----+----------+-------+ Left     Lt Pressure (mmHg)IndexWaveform  Comment +---------+------------------+-----+----------+-------+ Brachial 139                     triphasic         +---------+------------------+-----+----------+-------+ PTA      91                0.63 monophasic        +---------+------------------+-----+----------+-------+ DP       88                0.61 monophasic        +---------+------------------+-----+----------+-------+ Great Toe126               0.87 Abnormal          +---------+------------------+-----+----------+-------+ Right great to amputation, subsequent digits curled in and unable to elongate to be evaluated.  Summary: Right: Resting right ankle-brachial index is within normal range. Left: Resting left ankle-brachial index indicates moderate left lower extremity arterial disease. The left toe-brachial index is normal. *See table(s) above for measurements and observations.  Electronically signed by Penne Colorado MD on 05/05/2023 at 6:55:31 PM.    Final    ECHOCARDIOGRAM COMPLETE Result Date: 05/05/2023    ECHOCARDIOGRAM REPORT   Patient Name:   VOLNEY REIERSON Date of Exam: 05/05/2023 Medical Rec #:  [id number]        Height:       72.0 in Accession #:    [id number]       Weight:       200.0 lb Date of Birth:  05/08/1946       BSA:          2.131 m Patient Age:    77 years         BP:           154/69 mmHg Patient Gender: M                HR:           49 bpm. Exam Location:  Inpatient Procedure: 2D Echo, Cardiac Doppler and Color Doppler Indications:    CHF  History:        Patient has prior history of Echocardiogram examinations, most                 recent 06/30/2022. CHF, CAD; Risk Factors:Dyslipidemia, Diabetes  and Hypertension.  Sonographer:    Melissa Morford RDCS (AE, PE) Referring Phys: [id number] ANASTASSIA DOUTOVA IMPRESSIONS  1. Left ventricular ejection fraction, by estimation, is 40 to 45%. The left ventricle has mildly decreased function. The left ventricle has no regional wall motion abnormalities. There is mild concentric left ventricular hypertrophy. Left ventricular diastolic parameters are  indeterminate.  2. Right ventricular systolic function is moderately reduced. The right ventricular size is mildly enlarged. There is moderately elevated pulmonary artery systolic pressure. The estimated right ventricular systolic pressure is 51.0 mmHg.  3. Left atrial size was mild to moderately dilated.  4. Right atrial size was moderately dilated.  5. The mitral valve is normal in structure. No evidence of mitral valve regurgitation. No evidence of mitral stenosis.  6. Tricuspid valve regurgitation is mild to moderate.  7. The aortic valve is tricuspid. There is mild calcification of the aortic valve. Aortic valve regurgitation is not visualized. No aortic stenosis is present.  8. Aortic dilatation noted. There is borderline dilatation of the ascending aorta, measuring 38 mm.  9. The inferior vena cava is dilated in size with <50% respiratory variability, suggesting right atrial pressure of 15 mmHg. FINDINGS  Left Ventricle: Left ventricular ejection fraction, by estimation, is 40 to 45%. The left ventricle has mildly decreased function. The left ventricle has no regional wall motion abnormalities. Definity  contrast agent was given IV to delineate the left ventricular endocardial borders. The left ventricular internal cavity size was normal in size. There is mild concentric left ventricular hypertrophy. Left ventricular diastolic parameters are indeterminate. Right Ventricle: The right ventricular size is mildly enlarged. No increase in right ventricular wall thickness. Right ventricular systolic function is moderately reduced. There is moderately elevated pulmonary artery systolic pressure. The tricuspid regurgitant velocity is 3.00 m/s, and with an assumed right atrial pressure of 15 mmHg, the estimated right ventricular systolic pressure is 51.0 mmHg. Left Atrium: Left atrial size was mild to moderately dilated. Right Atrium: Right atrial size was moderately dilated. Pericardium: There is no evidence of  pericardial effusion. Mitral Valve: The mitral valve is normal in structure. No evidence of mitral valve regurgitation. No evidence of mitral valve stenosis. Tricuspid Valve: The tricuspid valve is normal in structure. Tricuspid valve regurgitation is mild to moderate. No evidence of tricuspid stenosis. Aortic Valve: The aortic valve is tricuspid. There is mild calcification of the aortic valve. Aortic valve regurgitation is not visualized. No aortic stenosis is present. Pulmonic Valve: The pulmonic valve was normal in structure. Pulmonic valve regurgitation is trivial. No evidence of pulmonic stenosis. Aorta: Aortic dilatation noted. There is borderline dilatation of the ascending aorta, measuring 38 mm. Venous: The inferior vena cava is dilated in size with less than 50% respiratory variability, suggesting right atrial pressure of 15 mmHg. IAS/Shunts: No atrial level shunt detected by color flow Doppler.  LEFT VENTRICLE PLAX 2D LVIDd:         5.20 cm   Diastology LVIDs:         4.80 cm   LV e' medial:    6.84 cm/s LV PW:         1.00 cm   LV E/e' medial:  8.8 LV IVS:        1.10 cm   LV e' lateral:   6.99 cm/s LVOT diam:     2.20 cm   LV E/e' lateral: 8.6 LV SV:         76 LV SV Index:   36 LVOT Area:  3.80 cm  RIGHT VENTRICLE RV S prime:     8.08 cm/s TAPSE (M-mode): 1.7 cm LEFT ATRIUM              Index        RIGHT ATRIUM           Index LA diam:        3.80 cm  1.78 cm/m   RA Area:     19.90 cm LA Vol (A2C):   101.0 ml 47.41 ml/m  RA Volume:   58.20 ml  27.32 ml/m LA Vol (A4C):   96.6 ml  45.34 ml/m LA Biplane Vol: 102.0 ml 47.87 ml/m  AORTIC VALVE LVOT Vmax:   81.10 cm/s LVOT Vmean:  57.400 cm/s LVOT VTI:    0.200 m  AORTA Ao Root diam: 3.20 cm Ao Asc diam:  3.95 cm MITRAL VALVE               TRICUSPID VALVE MV Area (PHT): 3.11 cm    TR Peak grad:   36.0 mmHg MV Decel Time: 244 msec    TR Vmax:        300.00 cm/s MV E velocity: 60.20 cm/s MV A velocity: 27.20 cm/s  SHUNTS MV E/A ratio:  2.21         Systemic VTI:  0.20 m                            Systemic Diam: 2.20 cm Toribio Fuel MD Electronically signed by Toribio Fuel MD Signature Date/Time: 05/05/2023/11:27:07 AM    Final    CT CHEST WO CONTRAST Result Date: 05/05/2023 CLINICAL DATA:  Pneumonia, shortness of breath and right pleural effusion. EXAM: CT CHEST WITHOUT CONTRAST TECHNIQUE: Multidetector CT imaging of the chest was performed following the standard protocol without IV contrast. RADIATION DOSE REDUCTION: This exam was performed according to the departmental dose-optimization program which includes automated exposure control, adjustment of the mA and/or kV according to patient size and/or use of iterative reconstruction technique. COMPARISON:  Chest x-ray yesterday as well as other prior imaging. FINDINGS: Cardiovascular: Carotid, thoracic aortic and extensive coronary atherosclerosis with prior coronary stent placement. Top-normal/mildly enlarged heart. No pericardial fluid identified. Mildly dilated central pulmonary arteries with the main pulmonary artery measuring 3.4 cm. Normal caliber thoracic aorta. Mediastinum/Nodes: Small scattered mediastinal and supraclavicular lymph nodes without visualized enlarged lymph nodes. Normal tracheal patency. Lungs/Pleura: Dense right lower lobe consolidation/atelectasis with associated moderate volume right pleural effusion. Density of pleural fluid is low and the effusion is not loculated. Minimal/trace pleural fluid on the left. Additional reticulonodular pattern of interstitial thickening in both upper lobes and the lingula may be on the basis of atypical infection, chronic lung disease, heart failure or other interstitial disease. Also suggestion of some potential patchy airspace disease versus atelectasis at the left lung base. Upper Abdomen: Heterogeneous appearance of the liver is nonspecific. Some degree of underlying parenchymal hepatic disease suspected. Correlation suggested with any  risk factors for liver disease. Musculoskeletal: No chest wall mass or suspicious bone lesions identified. IMPRESSION: 1. Dense right lower lobe consolidation/atelectasis with associated moderate volume right pleural effusion. Density of pleural fluid is low and the effusion is not loculated. 2. Minimal/trace pleural fluid on the left. 3. Additional reticulonodular pattern of interstitial thickening in both upper lobes and the lingula may be on the basis of atypical infection, chronic lung disease, heart failure or other interstitial disease. Also suggestion of some  potential patchy airspace disease versus atelectasis at the left lung base. Follow-up chest CT may be helpful to follow interstitial disease. 4. Heterogeneous appearance of the liver is nonspecific. Some degree of underlying parenchymal hepatic disease suspected. Correlation suggested with any risk factors for liver disease. 5. Carotid, thoracic aortic and extensive coronary atherosclerosis with prior coronary stent placement. 6. Mildly dilated central pulmonary arteries with the main pulmonary artery measuring 3.4 cm. This is suggestive of some degree of underlying pulmonary arterial hypertension. Aortic Atherosclerosis (ICD10-I70.0). Electronically Signed   By: Marcey Moan M.D.   On: 05/05/2023 08:54   DG Foot 2 Views Left Result Date: 05/05/2023 CLINICAL DATA:  Swelling and chronic skin changes EXAM: LEFT FOOT - 2 VIEW COMPARISON:  None Available. FINDINGS: Demineralization. Amputation of the fifth toe at the proximal metatarsal diaphysis. No evidence of acute fracture or osteomyelitis. No dislocation. IMPRESSION: No evidence of acute fracture or osteomyelitis. Electronically Signed   By: Norman Gatlin M.D.   On: 05/05/2023 02:12   DG Chest 2 View Result Date: 05/04/2023 CLINICAL DATA:  Shortness of breath EXAM: CHEST - 2 VIEW COMPARISON:  06/29/2022 FINDINGS: Stable cardiomegaly. Aortic atherosclerotic calcification. Moderate layering  right pleural effusion and associated airspace opacities. The left lung is clear. No pneumothorax. No displaced rib fractures. IMPRESSION: Moderate right pleural effusion and associated atelectasis or pneumonia. Electronically Signed   By: Norman Gatlin M.D.   On: 05/04/2023 19:50    Microbiology: No results found for this or any previous visit (from the past 240 hours).   Labs: Basic Metabolic Panel: Recent Labs  Lab 05/21/23 0240 05/22/23 0312 05/23/23 0225 05/24/23 0233 05/25/23 0228  NA 140 137 138 135 137  K 3.8 4.2 4.3 4.1 4.4  CL 102 100 98 94* 97*  CO2 29 28 31 31 30   GLUCOSE 168* 219* 133* 174* 125*  BUN 46* 52* 55* 54* 55*  CREATININE 2.17* 2.72* 2.74* 2.59* 2.59*  CALCIUM  8.4* 8.3* 8.7* 8.4* 8.3*   Liver Function Tests: No results for input(s): AST, ALT, ALKPHOS, BILITOT, PROT, ALBUMIN  in the last 168 hours. No results for input(s): LIPASE, AMYLASE in the last 168 hours. No results for input(s): AMMONIA in the last 168 hours. CBC: Recent Labs  Lab 05/21/23 0240 05/22/23 0312 05/23/23 0225 05/24/23 0233 05/25/23 0228  WBC 10.6* 9.8 12.5* 10.9* 11.3*  HGB 7.7* 7.5* 8.0* 8.2* 7.8*  HCT 24.4* 24.3* 25.7* 25.9* 25.2*  MCV 105.6* 108.5* 107.5* 106.6* 108.6*  PLT 307 320 343 361 376   Cardiac Enzymes: No results for input(s): CKTOTAL, CKMB, CKMBINDEX, TROPONINI in the last 168 hours. BNP: BNP (last 3 results) Recent Labs    06/29/22 0454 05/04/23 2047 05/11/23 2310  BNP 243.4* 455.1* 202.7*    ProBNP (last 3 results) No results for input(s): PROBNP in the last 8760 hours.  CBG: Recent Labs  Lab 05/24/23 1109 05/24/23 1558 05/24/23 2138 05/25/23 0557 05/25/23 1126  GLUCAP 167* 228* 207* 103* 130*       Signed:  Sigurd Pac MD.  [facility] 05/25/2023, 12:21 PM

## 2023-05-25 NOTE — Progress Notes (Signed)
   Heart Failure Stewardship Pharmacist Progress Note   PCP: Rexanne Ingle, MD PCP-Cardiologist: None    HPI:  78 yo M with PMH of CAD, CHF, HLD, pulmonary HTN, anemia, T2DM, neuropathy, CKD IV, and OSA.   Admitted from 12/16-12/23 with CHF exacerbation, CAP, and COPD. ECHO 12/17 showed LVEF 40-45%, mild concentric LVH, RV moderately reduced. He was discharged to SNF but had respiratory distress and came back to the ED. He was then intubated in the ED. S/p thoracentesis on 12/24 removing 1900 cc's of fluid from the right pleural space. Extubated on 12/25.   Has diuresed well and transitioned to torsemide . Creatinine has improved and stable from yesterday. Patient sleeping.   Current HF Medications: Diuretic: torsemide  20 mg BID Other: BiDil  20/37.5 mg 2 tabs TID  Prior to admission HF Medications: Diuretic: furosemide  40 mg daily Beta blocker: metoprolol  XL 100 mg daily Other: BiDil  20/37.5 mg 2 tabs TID  Pertinent Lab Values: Serum creatinine 2.59, BUN 55, Potassium 4.4, Sodium 137, Magnesium  3.0, A1c 6.4, BNP 202.7  Vital Signs: Weight: 207 lbs (admission weight: 228 lbs) Blood pressure: 120-140/60s  Heart rate: 60-70s  I/O: net +0.3L yesterday; net -18.7L since admission  Medication Assistance / Insurance Benefits Check: Does the patient have prescription insurance?  Yes Type of insurance plan: [facility] Medicare  Outpatient Pharmacy:  Prior to admission outpatient pharmacy: Pill pack by [facility] Is the patient willing to use [facility] TOC pharmacy at discharge? Yes Is the patient willing to transition their outpatient pharmacy to utilize a [facility] outpatient pharmacy?   No    Assessment: 1. Acute on chronic systolic CHF (LVEF 40-45%). NYHA class II symptoms. - On torsemide  20 mg BID, creatinine improving, stable. Strict I/Os and daily weights. Keep K>4 and Mg>2. - Holding PTA metoprolol  XL due to COPD exacerbation - Continue BiDil  20/37.5 mg 2 tabs TID - GDMT limited by  advanced CKD   Plan: 1) Medication changes recommended at this time: - Continue current regimen  2) Patient assistance: - None pending  3)  Education  - Patient has been educated on current HF medications and potential additions to HF medication regimen - Patient verbalizes understanding that over the next few months, these medication doses may change and more medications may be added to optimize HF regimen - Patient has been educated on basic disease state pathophysiology and goals of therapy   Duwaine Plant, PharmD, BCPS Heart Failure Stewardship Pharmacist Phone [phone]

## 2023-05-25 NOTE — Progress Notes (Signed)
 Heart Failure Nurse Navigator Progress Note   Spoke with patient with the need to change his HF TOC appointment from 05/27/2023, due to still being hospitalized. Appointment changed to Tuesday, 06/09/2023 @ 3 pm, per patient request for only on a Tuesday can do appointments, because that's when his wife can drive him. Patient received a new appointment card and voiced his understanding.   Stephane Haddock, BSN, Scientist, Clinical (histocompatibility And Immunogenetics) Only

## 2023-05-25 NOTE — Care Management Important Message (Signed)
 Important Message  Patient Details  Name: David [name] MRN: [id number] Date of Birth: 05-16-1946   Important Message Given:  Yes - Medicare IM     [name] 05/25/2023, 12:02 PM

## 2023-05-25 NOTE — Progress Notes (Signed)
 Nutrition Follow-up  DOCUMENTATION CODES:   Non-severe (moderate) malnutrition in context of chronic illness  INTERVENTION:  - Ensure Enlive BID between meals - vanilla - Increased finger foods - Liberalize diet -Discussion of importance of intake to progress toward discharge -Meal set up/assistance to prevent difficulty w/ PO intake  NUTRITION DIAGNOSIS:   Moderate Malnutrition related to chronic illness as evidenced by energy intake < 75% for > or equal to 1 month, moderate fat depletion, severe muscle depletion.  GOAL:  Patient will meet greater than or equal to 90% of their needs  MONITOR:  PO intake, Supplement acceptance, Weight trends  REASON FOR ASSESSMENT:  Consult Assessment of nutrition requirement/status  ASSESSMENT:  78 y.o. male previously admitted for SOB and diarrhea. Discharged from hospital on 12/23 to [facility] and returned to hospital 4 hours later c/o respiratory distress and required intubation. PMH: CHF (12/17: Echo EF 40-45%), pulmonary HTN, T2DM, GERD, arthritis, CKD IV, PAD, CAD w/ stent placement 11/18, diabetic foot amputations.  12/23 - Discharged to [facility] 12/23 - Returned to hospital; intubated on propofol  12/24 - Thoracentesis (1.9L removed) 12/25 - Extubated 12/28 - Completed cefepime  01/06 - Discharging  Pt discharging today. He endorses feeling better at bedside visit today. Appetite greatly improved with average 78% intake x8 meals documented.  Admission Weight: 103.5kg Lowest Weight: 91.2kg (01/02) Current Weight: 94.2kg  Weight trends desirable. Pt endorsing no issues with meal intake or difficulty chewing or swallowing. Will be discharging home with HHPT/OT/SN.   Discussed continued goal of increasing HBV protein intake to manage blood sugar and preserve lean body mass. Recommended continuing supplementation at home if he feels he has not eaten enough. Her verbalized understanding.   Labs: CBGs 125-219mg /dL x5  days CRP 3.4  Meds: darbepoetin alfa , ferrous sulfate , torsemide , SSI, semglee , trazadone   Diet Order:   Diet Order             Diet - low sodium heart healthy           Diet Carb Modified           Diet regular Room service appropriate? Yes with Assist; Fluid consistency: Thin  Diet effective now            EDUCATION NEEDS:   Education needs have been addressed  Skin:  Skin Assessment: Skin Integrity Issues: Skin Integrity Issues:: Stage III Stage III: stage III to sacrum and L heel  Last BM:  12/29  Height:  Ht Readings from Last 1 Encounters:  05/11/23 5' 11 (1.803 m)    Weight:  Wt Readings from Last 1 Encounters:  05/25/23 94.2 kg    Ideal Body Weight:  78.2 kg  BMI:  Body mass index is 28.96 kg/m.  Estimated Nutritional Needs:   Kcal:  2100-2300kcal  Protein:  105-115g  Fluid:  >2L  Blair Deaner MS, RD, LDN Registered Dietitian Clinical Nutrition RD Inpatient Contact Info in Amion

## 2023-05-25 NOTE — TOC Progression Note (Signed)
 Transition of Care [facility]) - Progression Note    Patient Details  Name: David [name] MRN: [id number] Date of Birth: 16-Dec-1945  Transition of [facility] CM/SW Contact  Waddell Barnie Rama, RN Phone Number: 05/25/2023, 10:43 AM  Clinical Narrative:    For dc today, he will need oxygen and ptar transport, will need oxygen saturations and oxygen orders, and HH orders.         Expected Discharge Plan and Services         Expected Discharge Date: 05/25/23                                     Social Determinants of Health (SDOH) Interventions SDOH Screenings   Food Insecurity: No Food Insecurity (05/14/2023)  Housing: Low Risk  (05/18/2023)  Transportation Needs: No Transportation Needs (05/18/2023)  Recent Concern: Transportation Needs - Unmet Transportation Needs (05/05/2023)  Utilities: Not At Risk (05/14/2023)  Alcohol Screen: Low Risk  (05/18/2023)  Financial Resource Strain: Low Risk  (05/18/2023)  Physical Activity: Inactive (08/20/2017)  Social Connections: Socially Integrated (05/19/2023)  Stress: No Stress Concern Present (08/20/2017)  Tobacco Use: Medium Risk (05/14/2023)    Readmission Risk Interventions     No data to display

## 2023-05-26 ENCOUNTER — Other Ambulatory Visit (HOSPITAL_COMMUNITY): Payer: Self-pay

## 2023-05-27 ENCOUNTER — Encounter (HOSPITAL_COMMUNITY): Payer: Medicare Other

## 2023-05-30 ENCOUNTER — Other Ambulatory Visit: Payer: Self-pay

## 2023-05-30 ENCOUNTER — Emergency Department (HOSPITAL_COMMUNITY): Payer: Medicare Other

## 2023-05-30 ENCOUNTER — Emergency Department (HOSPITAL_COMMUNITY)
Admission: EM | Admit: 2023-05-30 | Discharge: 2023-06-02 | Disposition: A | Discharge status: Home / Self Care | Payer: Medicare Other | Attending: Emergency Medicine | Admitting: Emergency Medicine

## 2023-05-30 DIAGNOSIS — R0682 Tachypnea, not elsewhere classified: Secondary | ICD-10-CM | POA: Insufficient documentation

## 2023-05-30 DIAGNOSIS — M25552 Pain in left hip: Secondary | ICD-10-CM | POA: Insufficient documentation

## 2023-05-30 DIAGNOSIS — M25562 Pain in left knee: Secondary | ICD-10-CM | POA: Diagnosis not present

## 2023-05-30 DIAGNOSIS — R600 Localized edema: Secondary | ICD-10-CM | POA: Diagnosis not present

## 2023-05-30 DIAGNOSIS — R531 Weakness: Secondary | ICD-10-CM | POA: Diagnosis present

## 2023-05-30 DIAGNOSIS — Z794 Long term (current) use of insulin: Secondary | ICD-10-CM | POA: Diagnosis not present

## 2023-05-30 DIAGNOSIS — Z7982 Long term (current) use of aspirin: Secondary | ICD-10-CM | POA: Insufficient documentation

## 2023-05-30 LAB — COMPREHENSIVE METABOLIC PANEL
ALT: 20 U/L (ref 0–44)
AST: 24 U/L (ref 15–41)
Albumin: 3 g/dL — ABNORMAL LOW (ref 3.5–5.0)
Alkaline Phosphatase: 126 U/L (ref 38–126)
Anion gap: 8 (ref 5–15)
BUN: 46 mg/dL — ABNORMAL HIGH (ref 8–23)
CO2: 29 mmol/L (ref 22–32)
Calcium: 8.9 mg/dL (ref 8.9–10.3)
Chloride: 99 mmol/L (ref 98–111)
Creatinine, Ser: 2.11 mg/dL — ABNORMAL HIGH (ref 0.61–1.24)
GFR, Estimated: 32 mL/min — ABNORMAL LOW (ref >60)
Glucose, Bld: 155 mg/dL — ABNORMAL HIGH (ref 70–99)
Potassium: 4.5 mmol/L (ref 3.5–5.1)
Sodium: 136 mmol/L (ref 135–145)
Total Bilirubin: 0.7 mg/dL (ref 0.0–1.2)
Total Protein: 7.7 g/dL (ref 6.5–8.1)

## 2023-05-30 LAB — CBC WITH DIFFERENTIAL/PLATELET
Abs Immature Granulocytes: 0.04 10*3/uL (ref 0.00–0.07)
Basophils Absolute: 0 10*3/uL (ref 0.0–0.1)
Basophils Relative: 0 %
Eosinophils Absolute: 0.1 10*3/uL (ref 0.0–0.5)
Eosinophils Relative: 1 %
HCT: 30.4 % — ABNORMAL LOW (ref 39.0–52.0)
Hemoglobin: 9.7 g/dL — ABNORMAL LOW (ref 13.0–17.0)
Immature Granulocytes: 0 %
Lymphocytes Relative: 9 %
Lymphs Abs: 0.8 10*3/uL (ref 0.7–4.0)
MCH: 34.9 pg — ABNORMAL HIGH (ref 26.0–34.0)
MCHC: 31.9 g/dL (ref 30.0–36.0)
MCV: 109.4 fL — ABNORMAL HIGH (ref 80.0–100.0)
Monocytes Absolute: 1.2 10*3/uL — ABNORMAL HIGH (ref 0.1–1.0)
Monocytes Relative: 13 %
Neutro Abs: 7.3 10*3/uL (ref 1.7–7.7)
Neutrophils Relative %: 77 %
Platelets: 340 10*3/uL (ref 150–400)
RBC: 2.78 MIL/uL — ABNORMAL LOW (ref 4.22–5.81)
RDW: 14.9 % (ref 11.5–15.5)
WBC: 9.5 10*3/uL (ref 4.0–10.5)
nRBC: 0 % (ref 0.0–0.2)

## 2023-05-30 LAB — CBG MONITORING, ED
Glucose-Capillary: 157 mg/dL — ABNORMAL HIGH (ref 70–99)
Glucose-Capillary: 119 mg/dL — ABNORMAL HIGH (ref 70–99)

## 2023-05-30 MED ORDER — HYDROCODONE-ACETAMINOPHEN 5-325 MG PO TABS
1.0000 | ORAL_TABLET | Freq: Four times a day (QID) | ORAL | Status: DC | PRN
  Administered 2023-05-30 – 2023-06-02 (×4): 1 via ORAL
  Filled 2023-05-30 (×4): qty 1

## 2023-05-30 MED ORDER — POLYETHYLENE GLYCOL 3350 17 G PO PACK
17.0000 g | PACK | Freq: Every day | ORAL | Status: DC
  Filled 2023-05-30 (×2): qty 1

## 2023-05-30 MED ORDER — ATORVASTATIN CALCIUM 40 MG PO TABS
40.0000 mg | ORAL_TABLET | Freq: Every day | ORAL | Status: DC
  Administered 2023-05-30 – 2023-06-02 (×4): 40 mg via ORAL
  Filled 2023-05-30 (×4): qty 1

## 2023-05-30 MED ORDER — TORSEMIDE 20 MG PO TABS
20.0000 mg | ORAL_TABLET | Freq: Two times a day (BID) | ORAL | Status: DC
  Administered 2023-05-30: 20 mg via ORAL
  Filled 2023-05-30 (×4): qty 1

## 2023-05-30 MED ORDER — LATANOPROST 0.005 % OP SOLN
1.0000 [drp] | Freq: Every day | OPHTHALMIC | Status: DC
  Administered 2023-05-30 – 2023-05-31 (×2): 1 [drp] via OPHTHALMIC
  Filled 2023-05-30: qty 2.5

## 2023-05-30 MED ORDER — TRAZODONE HCL 100 MG PO TABS
50.0000 mg | ORAL_TABLET | Freq: Every evening | ORAL | Status: DC | PRN
  Administered 2023-05-31 – 2023-06-02 (×2): 50 mg via ORAL
  Filled 2023-05-30 (×2): qty 1

## 2023-05-30 MED ORDER — ALBUTEROL SULFATE HFA 108 (90 BASE) MCG/ACT IN AERS: 2.0000 | INHALATION_SPRAY | Freq: Four times a day (QID) | RESPIRATORY_TRACT | Status: DC | PRN

## 2023-05-30 MED ORDER — ASPIRIN 81 MG PO TBEC
81.0000 mg | DELAYED_RELEASE_TABLET | Freq: Every day | ORAL | Status: DC
  Administered 2023-05-30 – 2023-06-02 (×4): 81 mg via ORAL
  Filled 2023-05-30 (×4): qty 1

## 2023-05-30 MED ORDER — ISOSORB DINITRATE-HYDRALAZINE 20-37.5 MG PO TABS
2.0000 | ORAL_TABLET | Freq: Three times a day (TID) | ORAL | Status: DC
  Administered 2023-05-30 – 2023-06-02 (×8): 2 via ORAL
  Filled 2023-05-30 (×11): qty 2

## 2023-05-30 MED ORDER — INSULIN GLARGINE-YFGN 100 UNIT/ML ~~LOC~~ SOLN
12.0000 [IU] | Freq: Every day | SUBCUTANEOUS | Status: DC
  Administered 2023-05-30 – 2023-06-02 (×4): 12 [IU] via SUBCUTANEOUS
  Filled 2023-05-30 (×4): qty 0.12

## 2023-05-30 MED ORDER — ALBUTEROL SULFATE (2.5 MG/3ML) 0.083% IN NEBU: 2.5000 mg | INHALATION_SOLUTION | Freq: Four times a day (QID) | RESPIRATORY_TRACT | Status: DC | PRN

## 2023-05-30 NOTE — ED Triage Notes (Signed)
 Pt brought in via EMS, called out for lift assist out of chair to bedside commode upon arrival pt reports he was hospitalized for 3 weeks and has been discharged for 4 days, pt unable to walk or care for himself at home.

## 2023-05-30 NOTE — ED Notes (Signed)
 RN assumed care at this time. Pt is breathing easy with equal chest rise and in no acute distress. Pt reports pain in his LLE knee with a score of 9 on a scale of 0-10. Pt on cardiac monitor and VSS. Call light in reach and family at bedside. Pt waiting for inpatient bed

## 2023-05-30 NOTE — ED Provider Notes (Signed)
 [place] [facility] DEPARTMENT AT [facility] Provider Note   CSN: [id number] Arrival date & time: 05/30/23  1636     History  Chief Complaint  Patient presents with   Weakness    David [name] is a 78 y.o. male.  HPI Patient presents 5 days after being discharged from this facility, now with concern for worsening generalized weakness.  He notes that on discharge he went home, after conversation about SNF.  Since being home he has been unable to function in a typical manner, has been essentially bedbound, unable to stand.  No areas of new pain though he has ongoing pain in his left hip, left knee, as well as generalized weakness, this is also unchanged.  No reported new fever, fall.     Home Medications Prior to Admission medications   Medication Sig Start Date End Date Taking? Authorizing Provider  albuterol  (PROVENTIL  HFA;VENTOLIN  HFA) 108 (90 Base) MCG/ACT inhaler Inhale 2 puffs into the lungs every 6 (six) hours as needed for wheezing or shortness of breath. Patient taking differently: Inhale 2 puffs into the lungs every 6 (six) hours. 04/09/17   Ricky Fines, MD  aspirin  EC 81 MG tablet Take 81 mg by mouth daily.    [provider]  atorvastatin  (LIPITOR ) 40 MG tablet Take 1 tablet (40 mg total) by mouth daily. 03/13/23   Patwardhan, Newman PARAS, MD  Blood Glucose Monitoring Suppl (BLOOD GLUCOSE MONITOR SYSTEM) w/Device KIT Use as directed 3 (three) times daily to check blood sugar 05/25/23   Fairy Frames, MD  ferrous sulfate  325 (65 FE) MG tablet Take 325 mg by mouth daily with breakfast.    [provider]  Glucose Blood (BLOOD GLUCOSE TEST STRIPS) STRP Use as directed 3 (three) times daily to check blood sugar. 05/25/23   Fairy Frames, MD  HYDROcodone -acetaminophen  (NORCO/VICODIN) 5-325 MG tablet Take 1 tablet by mouth every 6 (six) hours as needed for severe pain (pain score 7-10). 05/25/23   Fairy Frames, MD  insulin  glargine (LANTUS ) 100  UNIT/ML Solostar Pen Inject 12 Units into the skin daily. 05/26/23   Fairy Frames, MD  Insulin  Pen Needle (BD PEN NEEDLE NANO U/F) 32G X 4 MM MISC Use as directed 3 (three) times daily. 05/25/23   Fairy Frames, MD  isosorbide -hydrALAZINE  (BIDIL ) 20-37.5 MG tablet Take 2 tablets by mouth 3 (three) times daily. 05/25/23   Fairy Frames, MD  Lancet Device MISC Use as directed 3 times daily to assist with checking blood sugar. 05/25/23   Fairy Frames, MD  Lancets MISC Use as directed 3 (three) times daily to check blood sugar. 05/25/23   Fairy Frames, MD  latanoprost  (XALATAN ) 0.005 % ophthalmic solution Place 1 drop into both eyes at bedtime. 04/10/23   [provider]  Multiple Vitamin (MULTIVITAMIN WITH MINERALS) TABS tablet Take 1 tablet by mouth daily. 08/16/18   Sheikh, Alejandro Latif, DO  nitroGLYCERIN  (NITROSTAT ) 0.4 MG SL tablet Place 1 tablet (0.4 mg total) under the tongue every 5 (five) minutes as needed for chest pain. 05/02/22   Patwardhan, Newman PARAS, MD  polyethylene glycol powder (GLYCOLAX /MIRALAX ) 17 GM/SCOOP powder Take 1 capful (17 g) by mouth daily. 05/25/23   Fairy Frames, MD  torsemide  (DEMADEX ) 20 MG tablet Take 1 tablet (20 mg total) by mouth 2 (two) times daily. 05/25/23   Fairy Frames, MD  traZODone  (DESYREL ) 50 MG tablet Take 1 tablet (50 mg total) by mouth at bedtime as needed for sleep. 05/25/23   Fairy,  Sigurd, MD      Allergies    Patient has no known allergies.    Review of Systems   Review of Systems  Physical Exam Updated Vital Signs BP 127/65   Pulse 77   Temp 98.5 F (36.9 C) (Oral)   Resp 20   Ht 5' 11 (1.803 m)   Wt 94.2 kg   SpO2 96%   BMI 28.96 kg/m  Physical Exam Vitals and nursing note reviewed.  HENT:     Head: Normocephalic and atraumatic.     Right Ear: External ear normal.     Left Ear: External ear normal.     Nose: Nose normal.     Mouth/Throat:     Mouth: Mucous membranes are moist.  Eyes:     Extraocular Movements:  Extraocular movements intact.     Pupils: Pupils are equal, round, and reactive to light.  Cardiovascular:     Rate and Rhythm: Normal rate.  Pulmonary:     Comments: Patient is tachypneic, accessory muscle use, on CPAP, on nasal cannula, wheezing and crackles heard bilaterally Abdominal:     Tenderness: There is no abdominal tenderness.  Musculoskeletal:     Comments: Patient has extensive venous stasis changes and skin changes over his bilateral lower extremities up to the knees, he has pitting edema. Several toes are post amputation.  Skin:    General: Skin is warm and dry.       Neurological:     Mental Status: He is alert.     Motor: Weakness present.     ED Results / Procedures / Treatments   Labs (all labs ordered are listed, but only abnormal results are displayed) Labs Reviewed  COMPREHENSIVE METABOLIC PANEL - Abnormal; Notable for the following components:      Result Value   Glucose, Bld 155 (*)    BUN 46 (*)    Creatinine, Ser 2.11 (*)    Albumin  3.0 (*)    GFR, Estimated 32 (*)    All other components within normal limits  CBC WITH DIFFERENTIAL/PLATELET - Abnormal; Notable for the following components:   RBC 2.78 (*)    Hemoglobin 9.7 (*)    HCT 30.4 (*)    MCV 109.4 (*)    MCH 34.9 (*)    Monocytes Absolute 1.2 (*)    All other components within normal limits  CBG MONITORING, ED - Abnormal; Notable for the following components:   Glucose-Capillary 157 (*)    All other components within normal limits    EKG None  Radiology No results found.  Procedures Procedures    Medications Ordered in ED Medications - No data to display  ED Course/ Medical Decision Making/ A&P                                 Medical Decision Making Elderly male presents after recent hospitalization now with concern for weakness.  Patient is awake, alert, speaking clearly is hemodynamically unremarkable, pulse ox 96% room air normal Cardiac 75 sinus normal Patient's  medical problems include venous stasis changes, prior potation's, vasculopathy, and open wounds.  Concern for cellulitis, bacteremia, sepsis, electrolyte abnormalities.  Amount and/or Complexity of Data Reviewed External Data Reviewed: notes. Labs: ordered. Decision-making details documented in ED Course.  Risk Prescription drug management. Decision regarding hospitalization. Diagnosis or treatment significantly limited by social determinants of health.   7:17 PM Patient remains in unremarkable condition,  labs notable for improved renal function.  Given his multiple medical issues including substantial lymphedematous changes, inability to take care of himself, patient will have social work consult placed.        Final Clinical Impression(s) / ED Diagnoses Final diagnoses:  Weakness    Rx / DC Orders ED Discharge Orders     None         Garrick Charleston, MD 05/30/23 1920

## 2023-05-31 ENCOUNTER — Encounter (HOSPITAL_COMMUNITY): Payer: Self-pay | Admitting: Emergency Medicine

## 2023-05-31 LAB — CBG MONITORING, ED: Glucose-Capillary: 74 mg/dL (ref 70–99)

## 2023-05-31 MED ORDER — ONDANSETRON 4 MG PO TBDP: 4.0000 mg | ORAL_TABLET | Freq: Once | ORAL | Status: DC

## 2023-05-31 MED ORDER — ONDANSETRON 4 MG PO TBDP
4.0000 mg | ORAL_TABLET | Freq: Once | ORAL | Status: AC
  Administered 2023-05-31: 4 mg via ORAL
  Filled 2023-05-31: qty 1

## 2023-05-31 NOTE — Evaluation (Signed)
 Physical Therapy Evaluation Patient Details Name: David [name] MRN: [id number] DOB: 07/03/1945 Today's Date: 05/31/2023  History of Present Illness  78 y.o. male admitted from home 2* weakness and inability to stand or self care.  Pt with multiple recent hospital admits with most recent 05/11/23 with Acute hypoxemic respiratory failure,  Acute pulmonary edema,  HAP,  Right pleural effusion s/p thoracentesis.  PMHx of CAD (PCI), HTN, dyslipidemia, chronic systolic/diastolic CHF, pulmonary hypertension, anemia of chronic illness, DM-2, neuropathy, CKD-4, and possible OSA.  Clinical Impression  Pt admitted as above and presenting with functional mobility limitations 2* to significant generalized weakness, peripheral neuropathy all 4s, significant pain in L knee with all attempts to mobilize and noted prior balance and endurance deficits.  Pt currently requiring assist of two and lift equipment for safe performance of basic mobility tasks and Patient will benefit from continued inpatient follow up therapy, <3 hours/day       If plan is discharge home, recommend the following: Two people to help with walking and/or transfers;Assist for transportation;Help with stairs or ramp for entrance;Two people to help with bathing/dressing/bathroom;Assistance with cooking/housework;Direct supervision/assist for medications management   Can travel by private vehicle   No    Equipment Recommendations Hoyer lift;Hospital bed;Wheelchair (measurements PT);Wheelchair cushion (measurements PT) (if going home)  Recommendations for Other Services  OT consult    Functional Status Assessment Patient has had a recent decline in their functional status and demonstrates the ability to make significant improvements in function in a reasonable and predictable amount of time.     Precautions / Restrictions Precautions Precautions: Fall Restrictions Weight Bearing Restrictions Per Provider Order: No       Mobility  Bed Mobility               General bed mobility comments: Unable to assess 2* pt shouting out in pain with any attempts to move L LE    Transfers                        Ambulation/Gait                  Stairs            Wheelchair Mobility     Tilt Bed    Modified Rankin (Stroke Patients Only)       Balance                                             Pertinent Vitals/Pain Pain Assessment Pain Assessment: 0-10 Pain Score: 10-Worst pain ever Pain Location: L LE focused at knee with any attempt to move L LE Pain Descriptors / Indicators: Grimacing, Guarding, Moaning, Other (Comment) (shouting out) Pain Intervention(s): Limited activity within patient's tolerance, Monitored during session, Patient requesting pain meds-RN notified    Home Living Family/patient expects to be discharged to:: Skilled nursing facility Living Arrangements: Spouse/significant other Available Help at Discharge: Family;Available PRN/intermittently Type of Home: House Home Access: Stairs to enter Entrance Stairs-Rails: Right;Left Entrance Stairs-Number of Steps: 3 Alternate Level Stairs-Number of Steps: full flight, typically backs up the stairs on his bottom Home Layout: Two level;Able to live on main level with bedroom/bathroom Home Equipment: Rolling Walker (2 wheels);Cane - single point;Shower seat;Wheelchair - manual      Prior Function Prior Level of Function : Needs assist  Physical Assist : Mobility (physical);ADLs (physical) Mobility (physical): Transfers   Mobility Comments: Pt states since last hospital admit has progressed to inability to stand, transfer  or make it to the bathroom.  Ltd by overall weakness and L knee pain ADLs Comments: per pt, spouse assists but she is too small to provide level or assist currenlty needed     Extremity/Trunk Assessment   Upper Extremity Assessment Upper Extremity  Assessment: Generalized weakness (pt with heavy gloves on 2* neuropathy) RUE Sensation: history of peripheral neuropathy LUE Sensation: history of peripheral neuropathy    Lower Extremity Assessment Lower Extremity Assessment: Generalized weakness;RLE deficits/detail;LLE deficits/detail RLE Deficits / Details: Strength ~ 3-/5 with hip and knee flex to 90 and DF limited RLE Sensation: history of peripheral neuropathy LLE: Unable to fully assess due to pain LLE Sensation: history of peripheral neuropathy       Communication   Communication Communication: No apparent difficulties  Cognition Arousal: Alert Behavior During Therapy: WFL for tasks assessed/performed Overall Cognitive Status: Within Functional Limits for tasks assessed                         Following Commands: Follows one step commands consistently, Follows one step commands with increased time                General Comments      Exercises General Exercises - Lower Extremity Ankle Circles/Pumps: AROM, Both, 15 reps, Supine Quad Sets: Strengthening, Both, 10 reps, Supine Heel Slides: Right, 10 reps, Supine, AROM   Assessment/Plan    PT Assessment Patient needs continued PT services  PT Problem List Decreased strength;Decreased activity tolerance;Decreased balance;Decreased mobility;Decreased knowledge of use of DME;Decreased cognition;Decreased knowledge of precautions;Pain;Decreased range of motion;Impaired sensation;Cardiopulmonary status limiting activity;Decreased skin integrity       PT Treatment Interventions DME instruction;Functional mobility training;Therapeutic activities;Therapeutic exercise;Wheelchair mobility training;Balance training;Neuromuscular re-education;Patient/family education;Gait training    PT Goals (Current goals can be found in the Care Plan section)  Acute Rehab PT Goals Patient Stated Goal: Less pain; regain IND PT Goal Formulation: With patient Time For Goal  Achievement: 06/14/23 Potential to Achieve Goals: Fair    Frequency Min 1X/week     Co-evaluation               AM-PAC PT 6 Clicks Mobility  Outcome Measure Help needed turning from your back to your side while in a flat bed without using bedrails?: Total Help needed moving from lying on your back to sitting on the side of a flat bed without using bedrails?: Total Help needed moving to and from a bed to a chair (including a wheelchair)?: Total Help needed standing up from a chair using your arms (e.g., wheelchair or bedside chair)?: Total Help needed to walk in hospital room?: Total Help needed climbing 3-5 steps with a railing? : Total 6 Click Score: 6    End of Session Equipment Utilized During Treatment: Oxygen Activity Tolerance: Patient limited by pain Patient left: Other (comment) (gurney in ED) Nurse Communication: Mobility status;Patient requests pain meds;Need for lift equipment PT Visit Diagnosis: Muscle weakness (generalized) (M62.81);Difficulty in walking, not elsewhere classified (R26.2);Pain Pain - Right/Left: Left Pain - part of body: Knee;Leg    Time: 8676-8652 PT Time Calculation (min) (ACUTE ONLY): 24 min   Charges:   PT Evaluation $PT Eval Moderate Complexity: 1 Mod   PT General Charges $$ ACUTE PT VISIT: 1 Visit         Staci Dack  [facility] PT Acute Rehabilitation Services Pager [phone] Office [phone]   [facility] 05/31/2023, 4:38 PM

## 2023-05-31 NOTE — Progress Notes (Signed)
Awaiting PT.  

## 2023-05-31 NOTE — Care Management (Addendum)
 FL2 sent for signature, Dr Rhae Hammock, Faxed out to local SNF . Will monitor for acceptance

## 2023-05-31 NOTE — ED Notes (Signed)
 Patient requesting Trazodone for sleep.  Medicated with PRN order

## 2023-05-31 NOTE — [place] FL2 (Signed)
 [place]  MEDICAID FL2 LEVEL OF CARE FORM     IDENTIFICATION  Patient Name: David [name] Birthdate: [birth year] Sex: male Admission Date (Current Location): 05/30/2023  [facility] and [place] Number:  Producer, Television/film/video and Address:  [facility],  [address] [place], [place] [address]      Provider Number: [id number]  Attending Physician Name and Address:  System, Provider Not In  Relative Name and Phone Number:  Gerlean Cohens (Spouse)  [phone] [facility] Phone)    Current Level of Care: Hospital Recommended Level of Care: Skilled Nursing Facility Prior Approval Number:    Date Approved/Denied:   PASRR Number: [id number]  Discharge Plan: SNF    Current Diagnoses: Patient Active Problem List   Diagnosis Date Noted   CHF (congestive heart failure) (HCC) 05/12/2023   Malnutrition of moderate degree 05/06/2023   Diabetic foot infection (HCC) 05/05/2023   Sacral wound 05/05/2023   Pleural effusion on right 05/05/2023   Elevated TSH 05/05/2023   Acute on chronic diastolic (congestive) heart failure (HCC) 05/05/2023   Diastolic CHF (HCC) 05/04/2023   Class 1 obesity 06/30/2022   Acute on chronic diastolic CHF (congestive heart failure) (HCC) 06/29/2022   PAD (peripheral artery disease) (HCC) 06/13/2022   Critical limb ischemia of left lower extremity (HCC) 03/21/2021   Nonhealing ulcer of left lower extremity limited to breakdown of skin (HCC) 03/21/2021   Abscess of left foot 08/03/2020   Osteomyelitis of fifth toe of left foot (HCC)    Mixed hyperlipidemia 10/15/2018   Bilateral leg edema 09/17/2018   Stage 3b chronic kidney disease (HCC) 09/10/2018   Anemia 09/10/2018   Pain and swelling of wrist, right 08/14/2018   Bilateral carotid artery stenosis 07/30/2018   Coronary artery disease involving native coronary artery of native heart without angina pectoris 07/30/2018   Snoring 02/11/2018   Chronic heart failure with preserved ejection fraction  (HFpEF) (HCC) 12/22/2017   At risk for adverse drug reaction 12/15/2017   Chronic pain syndrome 12/15/2017   Status post coronary artery stent placement    Multifocal pneumonia 03/30/2017   Type 2 diabetes mellitus with hyperlipidemia (HCC) 03/29/2017   Acute respiratory failure with hypoxemia (HCC) 03/29/2017   Acute on chronic respiratory failure with hypoxia (HCC)    Pulmonary congestion    Acquired contracture of Achilles tendon, right 08/19/2016   Amputated great toe, right (HCC) 07/18/2016   Onychomycosis 05/29/2016   Diabetic polyneuropathy associated with type 2 diabetes mellitus (HCC) 04/09/2016   Right foot ulcer, limited to breakdown of skin (HCC) 04/09/2016   Hereditary and idiopathic peripheral neuropathy 09/15/2013   Malaise 08/14/2013   Essential hypertension 08/14/2013   Chronic back pain 08/14/2013   Glaucoma 08/14/2013   Headache 08/14/2013    Orientation RESPIRATION BLADDER Height & Weight     Self, Situation, Time, Place  O2 (2L Nasal Cannula) Continent Weight: 94.2 kg Height:  5' 11 (180.3 cm)  BEHAVIORAL SYMPTOMS/MOOD NEUROLOGICAL BOWEL NUTRITION STATUS      Continent Diet (Regular)  AMBULATORY STATUS COMMUNICATION OF NEEDS Skin       Normal                       Personal Care Assistance Level of Assistance  Bathing, Feeding, Dressing           Functional Limitations Info  Sight, Hearing, Speech Sight Info: Adequate Hearing Info: Adequate Speech Info: Adequate    SPECIAL CARE FACTORS FREQUENCY  PT (By licensed PT), OT (  By licensed OT)     PT Frequency: x5/week OT Frequency: x5/week            Contractures Contractures Info: Not present    Additional Factors Info  Code Status, Allergies Code Status Info: Full Allergies Info: No Known Allergies           Current Medications (05/31/2023):  This is the current hospital active medication list Current Facility-Administered Medications  Medication Dose Route Frequency Provider  Last Rate Last Admin   albuterol  (PROVENTIL ) (2.5 MG/3ML) 0.083% nebulizer solution 2.5 mg  2.5 mg Nebulization Q6H PRN Lynwood Setter, RPH       aspirin  EC tablet 81 mg  81 mg Oral Daily Garrick Charleston, MD   81 mg at 05/31/23 9095   atorvastatin  (LIPITOR ) tablet 40 mg  40 mg Oral Daily Garrick Charleston, MD   40 mg at 05/31/23 9095   HYDROcodone -acetaminophen  (NORCO/VICODIN) 5-325 MG per tablet 1 tablet  1 tablet Oral Q6H PRN Garrick Charleston, MD   1 tablet at 05/30/23 2007   insulin  glargine-yfgn (SEMGLEE ) injection 12 Units  12 Units Subcutaneous Daily Garrick Charleston, MD   12 Units at 05/31/23 1233   isosorbide -hydrALAZINE  (BIDIL ) 20-37.5 MG per tablet 2 tablet  2 tablet Oral TID Garrick Charleston, MD   2 tablet at 05/31/23 1233   latanoprost  (XALATAN ) 0.005 % ophthalmic solution 1 drop  1 drop Both Eyes QHS Garrick Charleston, MD   1 drop at 05/30/23 2232   polyethylene glycol (MIRALAX  / GLYCOLAX ) packet 17 g  17 g Oral Daily Garrick Charleston, MD       torsemide  (DEMADEX ) tablet 20 mg  20 mg Oral BID Garrick Charleston, MD   20 mg at 05/30/23 2007   traZODone  (DESYREL ) tablet 50 mg  50 mg Oral QHS PRN Garrick Charleston, MD       Current Outpatient Medications  Medication Sig Dispense Refill   albuterol  (PROVENTIL  HFA;VENTOLIN  HFA) 108 (90 Base) MCG/ACT inhaler Inhale 2 puffs into the lungs every 6 (six) hours as needed for wheezing or shortness of breath. (Patient taking differently: Inhale 2 puffs into the lungs every 6 (six) hours.) 1 Inhaler 2   amLODipine  (NORVASC ) 10 MG tablet Take 10 mg by mouth daily. (Patient not taking: Reported on 05/30/2023)     aspirin  EC 81 MG tablet Take 81 mg by mouth daily.     atorvastatin  (LIPITOR ) 40 MG tablet Take 1 tablet (40 mg total) by mouth daily. 90 tablet 3   Blood Glucose Monitoring Suppl (BLOOD GLUCOSE MONITOR SYSTEM) w/Device KIT Use as directed 3 (three) times daily to check blood sugar 1 kit 0   dextromethorphan -guaiFENesin  (MUCINEX  DM) 30-600 MG  12hr tablet Take 1 tablet by mouth 2 (two) times daily.     ferrous sulfate  325 (65 FE) MG tablet Take 325 mg by mouth daily with breakfast.     Glucose Blood (BLOOD GLUCOSE TEST STRIPS) STRP Use as directed 3 (three) times daily to check blood sugar. 100 strip 0   HYDROcodone -acetaminophen  (NORCO/VICODIN) 5-325 MG tablet Take 1 tablet by mouth every 6 (six) hours as needed for severe pain (pain score 7-10). 30 tablet 0   insulin  glargine (LANTUS ) 100 UNIT/ML Solostar Pen Inject 12 Units into the skin daily. 5 mL 1   Insulin  Pen Needle (BD PEN NEEDLE NANO U/F) 32G X 4 MM MISC Use as directed 3 (three) times daily. 100 each 0   isosorbide -hydrALAZINE  (BIDIL ) 20-37.5 MG tablet Take 2 tablets by mouth 3 (three)  times daily. 180 tablet 1   Lancet Device MISC Use as directed 3 times daily to assist with checking blood sugar. 1 each 0   Lancets MISC Use as directed 3 (three) times daily to check blood sugar. 100 each 0   latanoprost  (XALATAN ) 0.005 % ophthalmic solution Place 1 drop into both eyes at bedtime.     metoprolol  succinate (TOPROL -XL) 100 MG 24 hr tablet Take 100 mg by mouth daily. (Patient not taking: Reported on 05/30/2023)     Multiple Vitamin (MULTIVITAMIN WITH MINERALS) TABS tablet Take 1 tablet by mouth daily. 30 tablet 0   nitroGLYCERIN  (NITROSTAT ) 0.4 MG SL tablet Place 1 tablet (0.4 mg total) under the tongue every 5 (five) minutes as needed for chest pain. 30 tablet 3   polyethylene glycol powder (GLYCOLAX /MIRALAX ) 17 GM/SCOOP powder Take 1 capful (17 g) by mouth daily. 238 g 0   torsemide  (DEMADEX ) 20 MG tablet Take 1 tablet (20 mg total) by mouth 2 (two) times daily. 60 tablet 1   traZODone  (DESYREL ) 100 MG tablet Take 100 mg by mouth at bedtime.     traZODone  (DESYREL ) 50 MG tablet Take 1 tablet (50 mg total) by mouth at bedtime as needed for sleep. 20 tablet 0     Discharge Medications: Please see discharge summary for a list of discharge medications.  Relevant Imaging  Results:  Relevant Lab Results:   Additional Information SSN: [id number]  Corean JAYSON Canary, RN

## 2023-05-31 NOTE — ED Provider Notes (Signed)
 Emergency Medicine Observation Re-evaluation Note  David [name] is a 78 y.o. male, seen on rounds today.  Pt initially presented to the ED for complaints of Weakness Currently, the patient is asleep.  I have reviewed his labs, all look reassuring.  Patient is being held in the ER for placement.  He had recent admission for respiratory illness  Physical Exam  BP (!) 155/77   Pulse 70   Temp 98.5 F (36.9 C) (Oral)   Resp 14   Ht 5' 11 (1.803 m)   Wt 94.2 kg   SpO2 100%   BMI 28.96 kg/m  Physical Exam General: No distress Cardiac: Regular rate Lungs: No respiratory distress Psych: Currently calm  ED Course / MDM  EKG:   I have reviewed the labs performed to date as well as medications administered while in observation.  Recent changes in the last 24 hours include none.  It does not appear that patient has been seen by [facility] specialist.  We will reconsult TOC just to be on the safe side.  Home meds ordered.  PT ordered.  Plan  Current plan is for holding patient for social work placement.    Charlyn Sora, MD 05/31/23 [phone]

## 2023-06-01 ENCOUNTER — Other Ambulatory Visit: Payer: Self-pay

## 2023-06-01 MED ORDER — POLYETHYLENE GLYCOL 3350 17 G PO PACK
17.0000 g | PACK | Freq: Every day | ORAL | Status: DC
  Administered 2023-06-01: 17 g via ORAL
  Filled 2023-06-01: qty 1

## 2023-06-01 NOTE — Progress Notes (Signed)
 CSW presented bed offers to pt's spouse who has accepted [facility]. CSW has notified [facility] and will initiate auth.

## 2023-06-01 NOTE — ED Provider Notes (Signed)
 Emergency Medicine Observation Re-evaluation Note  David [name] is a 78 y.o. male, seen on rounds today.  Pt initially presented to the ED for complaints of Weakness Currently, the patient is asleep in bed.  Physical Exam  BP (!) 124/53 (BP Location: Right Arm)   Pulse 65   Temp 99.1 F (37.3 C) (Oral)   Resp 14   Ht 5' 11 (1.803 m)   Wt 94.2 kg   SpO2 95%   BMI 28.96 kg/m  Physical Exam General: Asleep, no acute distress Cardiac: Regular rate Lungs: No increased work of breathing Psych: Calm, sleep  ED Course / MDM  EKG:   I have reviewed the labs performed to date as well as medications administered while in observation.  Recent changes in the last 24 hours include patient remains medically cleared.  He was evaluated by PT and recommended for SNF.  Plan  Current plan is for SNF.    Kingsley, Precilla Purnell K, DO 06/01/23 [phone]

## 2023-06-01 NOTE — Patient Outreach (Signed)
 Returning phone call as patient left V/M for [place] to update contact information. Updated and confirmed with spouse as pt is currently admitted.  Vanice Sarah Care Management Assistant [phone]

## 2023-06-02 NOTE — ED Notes (Signed)
 Patient stated he takes 150 mg of trazodone at home. Nurse will notify provider.

## 2023-06-02 NOTE — ED Notes (Signed)
 Assumed care of patient. Patient currently sleeping in bed with no signs of acute distress noted. Waiting on placement.

## 2023-06-02 NOTE — ED Provider Notes (Signed)
 Emergency Medicine Observation Re-evaluation Note  David [name] is a 78 y.o. male, seen on rounds today.  Pt initially presented to the ED for complaints of Weakness Currently, the patient is weight, alert, pleasantly interactive.  Physical Exam  BP (!) 153/76   Pulse 62   Temp 98.2 F (36.8 C)   Resp 17   Ht 5' 11 (1.803 m)   Wt 94.2 kg   SpO2 100%   BMI 28.96 kg/m  Physical Exam General: No distress adult male Cardiac: Regular rate and rhythm Lungs: No increased work of breathing Psych: Calm  ED Course / MDM  EKG:   I have reviewed the labs performed to date as well as medications administered while in observation.  Recent changes in the last 24 hours include patient has had improvement with PT, notes that he is able to bear weight.  I discussed this case with social work and he has been accepted to a rehabilitation facility, patient appropriate for discharge.  Plan  Current plan is for discharge.    Garrick Charleston, MD 06/02/23 1351

## 2023-06-02 NOTE — ED Notes (Signed)
 Patient provided with warm blankets.

## 2023-06-02 NOTE — Discharge Instructions (Signed)
 Please follow-up with your physician, return here for concerning changes in your condition.

## 2023-06-02 NOTE — Progress Notes (Signed)
 Physical Therapy Treatment Patient Details Name: David [name] MRN: [id number] DOB: Apr 12, 1946 Today's Date: 06/02/2023   History of Present Illness 78 y.o. male admitted from home 2* weakness and inability to stand or self care. L knee xray- negative for acute findings. Pt with multiple recent hospital admits with most recent 05/11/23 with Acute hypoxemic respiratory failure,  Acute pulmonary edema,  HAP,  Right pleural effusion s/p thoracentesis.  PMHx of CAD (PCI), HTN, dyslipidemia, chronic systolic/diastolic CHF, pulmonary hypertension, anemia of chronic illness, DM-2, neuropathy, CKD-4, and possible OSA.    PT Comments  Pt reports knee feeling some better and he was able to make progress with therapy today.  He was pleased with his progress. Did still note edema throughout L knee but pt tolerating ROM , exercises and transfers.  Pt required mod A to transfer to EOB, and heavy mod A x 2 for 3 attempts to stand.  Pt took 1-2 steps but very unstable with max x 2.  Encouraged to perform bed exercises on his own and will continue therapy.  Patient will benefit from continued inpatient follow up therapy, <3 hours/day at d/c.     If plan is discharge home, recommend the following: Two people to help with walking and/or transfers;Assist for transportation;Help with stairs or ramp for entrance;Two people to help with bathing/dressing/bathroom;Assistance with cooking/housework;Direct supervision/assist for medications management   Can travel by private vehicle     No  Equipment Recommendations  Hoyer lift;Hospital bed;Wheelchair (measurements PT);Wheelchair cushion (measurements PT)    Recommendations for Other Services       Precautions / Restrictions Precautions Precautions: Fall     Mobility  Bed Mobility Overal bed mobility: Needs Assistance Bed Mobility: Supine to Sit     Supine to sit: Mod assist, HOB elevated, Used rails Sit to supine: Mod assist, HOB elevated, Used rails         Transfers Overall transfer level: Needs assistance Equipment used: Rolling walker (2 wheels) Transfers: Sit to/from Stand Sit to Stand: Mod assist, +2 physical assistance           General transfer comment: Performed STS x 3 with heavy mod A x 2 and heavy use of bil UE and RW.  Pt difficulty straightening knees requiring R knee blocked (did not block L due to pain).  Pt additionally leans to R.    Ambulation/Gait Ambulation/Gait assistance: Max assist, +2 physical assistance Gait Distance (Feet): 1 Feet Assistive device: Rolling walker (2 wheels) Gait Pattern/deviations: Step-to pattern, Decreased stride length Gait velocity: decreased     General Gait Details: Pt took 1 step forward -uncontrolled requiring max x 2 to maintain upright   Stairs             Wheelchair Mobility     Tilt Bed    Modified Rankin (Stroke Patients Only)       Balance Overall balance assessment: Needs assistance Sitting-balance support: Bilateral upper extremity supported, Feet supported Sitting balance-Leahy Scale: Poor Sitting balance - Comments: Tends to lean R; required UE use and close supervision.  With LE exercises needs min A for back support   Standing balance support: Bilateral upper extremity supported Standing balance-Leahy Scale: Poor Standing balance comment: Requiring RW and mod x 2 from therapist.  Difficult standing upright with anterior and R lead , difficulty straigthen knees and tucking buttock  Cognition Arousal: Alert Behavior During Therapy: WFL for tasks assessed/performed Overall Cognitive Status: Within Functional Limits for tasks assessed                                          Exercises General Exercises - Lower Extremity Ankle Circles/Pumps: AROM, Both, 10 reps, Supine Quad Sets: Strengthening, Both, 10 reps, Supine Gluteal Sets: Strengthening, Both, 10 reps, Supine Long Arc Quad: AROM,  Right, AAROM, Left, 5 reps, Seated Heel Slides: AROM, Both, 10 reps, Supine (limited motion) Other Exercises Other Exercises: Encouraged pt to perform quad sets, ankle pumps, gluteal sets, heel slides at least 3 x day on his own    General Comments General comments (skin integrity, edema, etc.): Pt on 1.5 L O2 with sats 98%. Tried RA and maintained 89%.  Back on 1.5 L at end of session.      Pertinent Vitals/Pain Pain Assessment Pain Assessment: Faces Faces Pain Scale: Hurts a little bit Pain Location: L knee Pain Descriptors / Indicators: Discomfort Pain Intervention(s): Limited activity within patient's tolerance, Monitored during session, Premedicated before session, Repositioned    Home Living                          Prior Function            PT Goals (current goals can now be found in the care plan section) Progress towards PT goals: Progressing toward goals    Frequency    Min 1X/week      PT Plan      Co-evaluation              AM-PAC PT 6 Clicks Mobility   Outcome Measure  Help needed turning from your back to your side while in a flat bed without using bedrails?: A Lot Help needed moving from lying on your back to sitting on the side of a flat bed without using bedrails?: A Lot Help needed moving to and from a bed to a chair (including a wheelchair)?: Total Help needed standing up from a chair using your arms (e.g., wheelchair or bedside chair)?: Total Help needed to walk in hospital room?: Total Help needed climbing 3-5 steps with a railing? : Total 6 Click Score: 8    End of Session Equipment Utilized During Treatment: Gait belt Activity Tolerance: Patient tolerated treatment well Patient left: in bed;with call bell/phone within reach Nurse Communication: Mobility status PT Visit Diagnosis: Muscle weakness (generalized) (M62.81);Difficulty in walking, not elsewhere classified (R26.2);Pain Pain - Right/Left: Left Pain - part of  body: Knee     Time: 8899-8876 PT Time Calculation (min) (ACUTE ONLY): 23 min  Charges:    $Gait Training: 8-22 mins $Therapeutic Exercise: 8-22 mins PT General Charges $$ ACUTE PT VISIT: 1 Visit                     Benjiman, PT Acute Rehab Services [facility] [phone]    Benjiman VEAR Mulberry 06/02/2023, 11:32 AM

## 2023-06-02 NOTE — Progress Notes (Signed)
 CSW faxed updated PT eval to [facility].  Attn:[place] Fax: [phone]

## 2023-06-02 NOTE — Progress Notes (Addendum)
 Auth pending for [facility].   Addend @ 10:31 AM CSW received call from [facility] requesting an updated PT eval of pt participating. Deadline is 1pm. PT notified via secure chat.

## 2023-06-02 NOTE — ED Notes (Signed)
 Attempted to remove bed pan from pt during rounding. Pt. Refused tech to remove bed. CN made aware.

## 2023-06-02 NOTE — Progress Notes (Addendum)
 Auth approved. Auth ID#: [id number] with start date of today and next review date of 06/04/23. CM following is [place].   CSW notified Whitney at [facility]. Pt is able to admit today. RN and EDP notified via secure chat. Wife aware. PTAR to transport.

## 2023-06-02 NOTE — ED Notes (Signed)
 Attempted to give report to [facility], was told to call back in 10 minutes. Informed them that patient may make it to their facility prior to the 10 minutes, but I would do my best to call back.

## 2023-06-05 NOTE — Progress Notes (Incomplete)
HEART & VASCULAR TRANSITION OF CARE CONSULT NOTE    Referring Physician: Dr. Jomarie [name] Primary Care: David Dills, MD Primary Cardiologist: Dr. Rosemary [name]  Chief Complaint: post hospital HF follow up  HPI: Referred to clinic by Dr. Jomarie [name] for heart failure consultation.   David [name] is a 78 y.o.male with history of HTN, DM2, CKD, diabetic retinopathy and peripheral neuropathy, CAD s/p complex LAD PCI 03/2017, mod carotid stenosis, HFpEF, and pulmonary hypertension (WHO group II).   He has been followed closely by Dr. Rosemary [name]. Echo 2/24 EF 50-55%, normal RV  Admitted 12/24 with a/c HF. Echo showed EF mildly lower at 40-45%, RV mildly reduced. He was diuresed with IV lasix. Treated with abx for possible CAP and right moderate pleural effusion. He declined thoracentesis. GDMT titrated. He did not qualify for home oxygen but willing to pay out of pocket as it improved work of breathing. He was discharged to [facility] 05/11/23, weight *** lbs.  Re-admitted later in the day on 05/11/23 with acute respiratory distress, oxygen sats 60's on arrival to ED. Failed BiPap and was intubated. Underwent R thoracentesis yielding 1900 cc yellow fluid. Diuresed with IV lasix and eventually extubated. Refused labs and imaging. GDMT titrated, but limited by CKD. PT rec'd SNT however he declined, and he was discharged home weight 207 lbs.   Today he presents to [facility] for post hospital follow up. Overall feeling fine. Denies increasing SOB, CP, dizziness, edema, or PND/Orthopnea. Appetite ok. No fever or chills. Weight at home 170 pounds. Taking all medications.    Cardiac Testing    Past Medical History:  Diagnosis Date   Arthritis    CAD in native artery    s/p stent in 11/18   CHF (congestive heart failure) (HCC)    normal echo in 11/18   CKD (chronic kidney disease)    DDD (degenerative disc disease), cervical    Diabetes mellitus with complication (HCC)    Type II   Diabetic neuropathy  (HCC)    Diabetic retinopathy (HCC)    GERD (gastroesophageal reflux disease)    Glaucoma    Hypertension    Current Outpatient Medications  Medication Sig Dispense Refill   albuterol (PROVENTIL HFA;VENTOLIN HFA) 108 (90 Base) MCG/ACT inhaler Inhale 2 puffs into the lungs every 6 (six) hours as needed for wheezing or shortness of breath. 1 Inhaler 2   amLODipine (NORVASC) 10 MG tablet Take 10 mg by mouth daily.     aspirin EC 81 MG tablet Take 81 mg by mouth daily.     atorvastatin (LIPITOR) 40 MG tablet Take 1 tablet (40 mg total) by mouth daily. (Patient taking differently: Take 40 mg by mouth at bedtime.) 90 tablet 3   Blood Glucose Monitoring Suppl (BLOOD GLUCOSE MONITOR SYSTEM) w/Device KIT Use as directed 3 (three) times daily to check blood sugar 1 kit 0   dextromethorphan-guaiFENesin (MUCINEX DM) 30-600 MG 12hr tablet Take 1 tablet by mouth 2 (two) times daily.     ferrous sulfate 325 (65 FE) MG tablet Take 325 mg by mouth daily with breakfast.     Glucose Blood (BLOOD GLUCOSE TEST STRIPS) STRP Use as directed 3 (three) times daily to check blood sugar. 100 strip 0   HYDROcodone-acetaminophen (NORCO/VICODIN) 5-325 MG tablet Take 1 tablet by mouth every 6 (six) hours as needed for severe pain (pain score 7-10). 30 tablet 0   insulin glargine (LANTUS) 100 UNIT/ML Solostar Pen Inject 12 Units into the skin daily. 5 mL  1   Insulin Pen Needle (BD PEN NEEDLE NANO U/F) 32G X 4 MM MISC Use as directed 3 (three) times daily. 100 each 0   isosorbide-hydrALAZINE (BIDIL) 20-37.5 MG tablet Take 2 tablets by mouth 3 (three) times daily. 180 tablet 1   Lancet Device MISC Use as directed 3 times daily to assist with checking blood sugar. 1 each 0   Lancets MISC Use as directed 3 (three) times daily to check blood sugar. 100 each 0   latanoprost (XALATAN) 0.005 % ophthalmic solution Place 1 drop into both eyes at bedtime.     metoprolol succinate (TOPROL-XL) 100 MG 24 hr tablet Take 100 mg by mouth  daily.     Multiple Vitamin (MULTIVITAMIN WITH MINERALS) TABS tablet Take 1 tablet by mouth daily. 30 tablet 0   nitroGLYCERIN (NITROSTAT) 0.4 MG SL tablet Place 1 tablet (0.4 mg total) under the tongue every 5 (five) minutes as needed for chest pain. 30 tablet 3   polyethylene glycol powder (GLYCOLAX/MIRALAX) 17 GM/SCOOP powder Take 1 capful (17 g) by mouth daily. 238 g 0   torsemide (DEMADEX) 20 MG tablet Take 1 tablet (20 mg total) by mouth 2 (two) times daily. 60 tablet 1   traZODone (DESYREL) 100 MG tablet Take 100 mg by mouth at bedtime.     traZODone (DESYREL) 50 MG tablet Take 1 tablet (50 mg total) by mouth at bedtime as needed for sleep. 20 tablet 0   No current facility-administered medications for this visit.    No Known Allergies    Social History   Socioeconomic History   Marital status: Married    Spouse name: David [name]   Number of children: 2   Years of education: 16   Highest education level: Master's degree (e.g., MA, MS, MEng, MEd, MSW, MBA)  Occupational History   Occupation: Retired  Tobacco Use   Smoking status: Former    Current packs/day: 0.00    Average packs/day: 0.3 packs/day for 5.0 years (1.3 ttl pk-yrs)    Types: Cigarettes    Start date: 66    Quit date: 1996    Years since quitting: 29.0    Passive exposure: Past   Smokeless tobacco: Never  Vaping Use   Vaping status: Never Used  Substance and Sexual Activity   Alcohol use: Not Currently    Comment: occ   Drug use: No   Sexual activity: Yes  Other Topics Concern   Not on file  Social History Narrative   Not on file   Social Drivers of Health   Financial Resource Strain: Low Risk  (05/18/2023)   Overall Financial Resource Strain (CARDIA)    Difficulty of Paying Living Expenses: Not very hard  Food Insecurity: No Food Insecurity (05/14/2023)   Hunger Vital Sign    Worried About Running Out of Food in the Last Year: Never true    Ran Out of Food in the Last Year: Never true   Transportation Needs: No Transportation Needs (05/18/2023)   PRAPARE - Administrator, Civil Service (Medical): No    Lack of Transportation (Non-Medical): No  Recent Concern: Transportation Needs - Unmet Transportation Needs (05/05/2023)   PRAPARE - Transportation    Lack of Transportation (Medical): Yes    Lack of Transportation (Non-Medical): Yes  Physical Activity: Inactive (08/20/2017)   Exercise Vital Sign    Days of Exercise per Week: 0 days    Minutes of Exercise per Session: 0 min  Stress: No Stress Concern  Present (08/20/2017)   [employer] of Occupational Health - Occupational Stress Questionnaire    Feeling of Stress : Not at all  Social Connections: Socially Integrated (05/19/2023)   Social Connection and Isolation Panel [NHANES]    Frequency of Communication with Friends and Family: Twice a week    Frequency of Social Gatherings with Friends and Family: Twice a week    Attends Religious Services: 1 to 4 times per year    Active Member of [employer] or Organizations: Yes    Attends Banker Meetings: 1 to 4 times per year    Marital Status: Married  Catering manager Violence: Not At Risk (05/14/2023)   Humiliation, Afraid, Rape, and Kick questionnaire    Fear of Current or Ex-Partner: No    Emotionally Abused: No    Physically Abused: No    Sexually Abused: No      Family History  Problem Relation Age of Onset   Diabetes Other    Hyperlipidemia Other    Hypertension Other    Stroke Other    Alzheimer's disease Other    Thyroid disease Mother    Diabetes Mellitus II Father    Alzheimer's disease Father    There were no vitals filed for this visit.  PHYSICAL EXAM: General:  NAD. No resp difficulty HEENT: Normal Neck: Supple. No JVD. Carotids 2+ bilat; no bruits. No lymphadenopathy or thryomegaly appreciated. Cor: PMI nondisplaced. Regular rate & rhythm. No rubs, gallops or murmurs. Lungs: Clear Abdomen: Soft, nontender, nondistended.  No hepatosplenomegaly. No bruits or masses. Good bowel sounds. Extremities: No cyanosis, clubbing, rash, edema Neuro: Alert & oriented x 3, cranial nerves grossly intact. Moves all 4 extremities w/o difficulty. Affect pleasant.  ECG (personally reviewed):  ASSESSMENT & PLAN: HFmrEF - Echo 2/24 EF 50-55%, normal RV - Echo 12/24 EF 40-45%, RV mildly reduced - Unclear for mild drop in EF, ? Acute illness - NYHA ***, volume *** - Continue torsemide 20 mg bid. - Continue BiDil 2 tabs tid. - Continue Toprol XL 100 mg daily. - Continue  - ? Amyloid picture?  2. CAD - s/p complex LAD PCI 03/2017 - No chest pain - Continue ASA + statin  3. PAD - History of multiple toe amputations - Continue ASA + statin - Previously declined angiogram as recommended by vascular - Needs VVS follow up  4. Chronic Hypoxic Respiratory Failure - Has home O2 - Sats stable today on room air  5. CKD IV - Baseline SCr - Refer to Nephrology  6. Suspect sleep apnea - Consider repeat sleep study  7. Non-compliance  8. Physical Deconditioning  NYHA *** GDMT  Diuretic: torsemide 20 mg bid BB: Toprol XL 100 mg daily Ace/ARB/ARNI: no, CKD MRA: no CKD SGLT2i: no, GFR 23  Referred to HFSW (PCP, Medications, Transportation, ETOH Abuse, Drug Abuse, Insurance, Surveyor, quantity ): No Refer to Pharmacy: Yes or No Refer to Home Health: Yes on No Refer to Advanced Heart Failure Clinic: No, not a candidate for advanced therapies. Refer to General Cardiology: No, established with Dr. Rosemary [name]  Follow up PRN. Continue follow up with General Cardiology.  Prince Rome, FNP-BC 06/08/23

## 2023-06-09 ENCOUNTER — Inpatient Hospital Stay (HOSPITAL_COMMUNITY): Admit: 2023-06-09 | Discharge: 2023-06-09 | Disposition: A | Discharge status: Home / Self Care | Payer: Medicare Other

## 2023-09-04 ENCOUNTER — Emergency Department (HOSPITAL_COMMUNITY)

## 2023-09-04 ENCOUNTER — Encounter (HOSPITAL_COMMUNITY): Payer: Self-pay | Admitting: Internal Medicine

## 2023-09-04 ENCOUNTER — Other Ambulatory Visit: Payer: Self-pay

## 2023-09-04 ENCOUNTER — Inpatient Hospital Stay (HOSPITAL_COMMUNITY)
Admission: EM | Admit: 2023-09-04 | Discharge: 2023-09-09 | DRG: 292 | Disposition: A | Discharge status: Skilled Nursing Facility | Source: Skilled Nursing Facility | Attending: Family Medicine | Admitting: Family Medicine

## 2023-09-04 DIAGNOSIS — E11319 Type 2 diabetes mellitus with unspecified diabetic retinopathy without macular edema: Secondary | ICD-10-CM | POA: Diagnosis present

## 2023-09-04 DIAGNOSIS — Z7982 Long term (current) use of aspirin: Secondary | ICD-10-CM

## 2023-09-04 DIAGNOSIS — E1169 Type 2 diabetes mellitus with other specified complication: Secondary | ICD-10-CM | POA: Diagnosis present

## 2023-09-04 DIAGNOSIS — E11649 Type 2 diabetes mellitus with hypoglycemia without coma: Secondary | ICD-10-CM | POA: Diagnosis not present

## 2023-09-04 DIAGNOSIS — E1122 Type 2 diabetes mellitus with diabetic chronic kidney disease: Secondary | ICD-10-CM | POA: Diagnosis present

## 2023-09-04 DIAGNOSIS — D631 Anemia in chronic kidney disease: Secondary | ICD-10-CM | POA: Diagnosis present

## 2023-09-04 DIAGNOSIS — Z87891 Personal history of nicotine dependence: Secondary | ICD-10-CM

## 2023-09-04 DIAGNOSIS — Z8249 Family history of ischemic heart disease and other diseases of the circulatory system: Secondary | ICD-10-CM

## 2023-09-04 DIAGNOSIS — Z96641 Presence of right artificial hip joint: Secondary | ICD-10-CM | POA: Diagnosis present

## 2023-09-04 DIAGNOSIS — J9811 Atelectasis: Secondary | ICD-10-CM | POA: Diagnosis present

## 2023-09-04 DIAGNOSIS — D638 Anemia in other chronic diseases classified elsewhere: Principal | ICD-10-CM

## 2023-09-04 DIAGNOSIS — M542 Cervicalgia: Secondary | ICD-10-CM | POA: Diagnosis present

## 2023-09-04 DIAGNOSIS — J9611 Chronic respiratory failure with hypoxia: Secondary | ICD-10-CM | POA: Diagnosis present

## 2023-09-04 DIAGNOSIS — N179 Acute kidney failure, unspecified: Secondary | ICD-10-CM | POA: Diagnosis present

## 2023-09-04 DIAGNOSIS — E1142 Type 2 diabetes mellitus with diabetic polyneuropathy: Secondary | ICD-10-CM | POA: Diagnosis present

## 2023-09-04 DIAGNOSIS — I5042 Chronic combined systolic (congestive) and diastolic (congestive) heart failure: Secondary | ICD-10-CM | POA: Diagnosis present

## 2023-09-04 DIAGNOSIS — K5900 Constipation, unspecified: Secondary | ICD-10-CM | POA: Diagnosis not present

## 2023-09-04 DIAGNOSIS — Z794 Long term (current) use of insulin: Secondary | ICD-10-CM

## 2023-09-04 DIAGNOSIS — Z955 Presence of coronary angioplasty implant and graft: Secondary | ICD-10-CM

## 2023-09-04 DIAGNOSIS — I130 Hypertensive heart and chronic kidney disease with heart failure and stage 1 through stage 4 chronic kidney disease, or unspecified chronic kidney disease: Secondary | ICD-10-CM | POA: Diagnosis not present

## 2023-09-04 DIAGNOSIS — I2510 Atherosclerotic heart disease of native coronary artery without angina pectoris: Secondary | ICD-10-CM | POA: Diagnosis present

## 2023-09-04 DIAGNOSIS — Z833 Family history of diabetes mellitus: Secondary | ICD-10-CM

## 2023-09-04 DIAGNOSIS — R5381 Other malaise: Secondary | ICD-10-CM | POA: Diagnosis present

## 2023-09-04 DIAGNOSIS — H409 Unspecified glaucoma: Secondary | ICD-10-CM | POA: Diagnosis present

## 2023-09-04 DIAGNOSIS — I10 Essential (primary) hypertension: Secondary | ICD-10-CM | POA: Diagnosis present

## 2023-09-04 DIAGNOSIS — E785 Hyperlipidemia, unspecified: Secondary | ICD-10-CM | POA: Diagnosis present

## 2023-09-04 DIAGNOSIS — J90 Pleural effusion, not elsewhere classified: Secondary | ICD-10-CM | POA: Diagnosis present

## 2023-09-04 DIAGNOSIS — I700 Atherosclerosis of aorta: Secondary | ICD-10-CM | POA: Diagnosis present

## 2023-09-04 DIAGNOSIS — E119 Type 2 diabetes mellitus without complications: Secondary | ICD-10-CM | POA: Diagnosis present

## 2023-09-04 DIAGNOSIS — Z9981 Dependence on supplemental oxygen: Secondary | ICD-10-CM

## 2023-09-04 DIAGNOSIS — D649 Anemia, unspecified: Secondary | ICD-10-CM | POA: Diagnosis present

## 2023-09-04 DIAGNOSIS — K219 Gastro-esophageal reflux disease without esophagitis: Secondary | ICD-10-CM | POA: Diagnosis present

## 2023-09-04 DIAGNOSIS — N1832 Chronic kidney disease, stage 3b: Secondary | ICD-10-CM | POA: Diagnosis present

## 2023-09-04 DIAGNOSIS — M1990 Unspecified osteoarthritis, unspecified site: Secondary | ICD-10-CM | POA: Diagnosis present

## 2023-09-04 DIAGNOSIS — Z79899 Other long term (current) drug therapy: Secondary | ICD-10-CM

## 2023-09-04 HISTORY — DX: Anemia, unspecified: D64.9

## 2023-09-04 HISTORY — DX: Chronic kidney disease, stage 3 unspecified: N18.30

## 2023-09-04 LAB — COMPREHENSIVE METABOLIC PANEL WITH GFR
ALT: 28 U/L (ref 0–44)
AST: 21 U/L (ref 15–41)
Albumin: 3 g/dL — ABNORMAL LOW (ref 3.5–5.0)
Alkaline Phosphatase: 128 U/L — ABNORMAL HIGH (ref 38–126)
Anion gap: 11 (ref 5–15)
BUN: 69 mg/dL — ABNORMAL HIGH (ref 8–23)
CO2: 24 mmol/L (ref 22–32)
Calcium: 8.4 mg/dL — ABNORMAL LOW (ref 8.9–10.3)
Chloride: 103 mmol/L (ref 98–111)
Creatinine, Ser: 2.71 mg/dL — ABNORMAL HIGH (ref 0.61–1.24)
GFR, Estimated: 24 mL/min — ABNORMAL LOW (ref >60)
Glucose, Bld: 81 mg/dL (ref 70–99)
Potassium: 4.6 mmol/L (ref 3.5–5.1)
Sodium: 138 mmol/L (ref 135–145)
Total Bilirubin: 0.7 mg/dL (ref 0.0–1.2)
Total Protein: 7.8 g/dL (ref 6.5–8.1)

## 2023-09-04 LAB — CBC WITH DIFFERENTIAL/PLATELET
Abs Immature Granulocytes: 0.04 10*3/uL (ref 0.00–0.07)
Basophils Absolute: 0 10*3/uL (ref 0.0–0.1)
Basophils Relative: 0 %
Eosinophils Absolute: 0.1 10*3/uL (ref 0.0–0.5)
Eosinophils Relative: 2 %
HCT: 22.6 % — ABNORMAL LOW (ref 39.0–52.0)
Hemoglobin: 6.7 g/dL — CL (ref 13.0–17.0)
Immature Granulocytes: 1 %
Lymphocytes Relative: 9 %
Lymphs Abs: 0.7 10*3/uL (ref 0.7–4.0)
MCH: 35.1 pg — ABNORMAL HIGH (ref 26.0–34.0)
MCHC: 29.6 g/dL — ABNORMAL LOW (ref 30.0–36.0)
MCV: 118.3 fL — ABNORMAL HIGH (ref 80.0–100.0)
Monocytes Absolute: 0.9 10*3/uL (ref 0.1–1.0)
Monocytes Relative: 10 %
Neutro Abs: 6.8 10*3/uL (ref 1.7–7.7)
Neutrophils Relative %: 78 %
Platelets: 255 10*3/uL (ref 150–400)
RBC: 1.91 MIL/uL — ABNORMAL LOW (ref 4.22–5.81)
RDW: 13.7 % (ref 11.5–15.5)
WBC: 8.6 10*3/uL (ref 4.0–10.5)
nRBC: 0 % (ref 0.0–0.2)

## 2023-09-04 LAB — I-STAT CHEM 8, ED
BUN: 64 mg/dL — ABNORMAL HIGH (ref 8–23)
Calcium, Ion: 1.13 mmol/L — ABNORMAL LOW (ref 1.15–1.40)
Chloride: 106 mmol/L (ref 98–111)
Creatinine, Ser: 3.6 mg/dL — ABNORMAL HIGH (ref 0.61–1.24)
Glucose, Bld: 82 mg/dL (ref 70–99)
HCT: 20 % — ABNORMAL LOW (ref 39.0–52.0)
Hemoglobin: 6.8 g/dL — CL (ref 13.0–17.0)
Potassium: 4.6 mmol/L (ref 3.5–5.1)
Sodium: 139 mmol/L (ref 135–145)
TCO2: 25 mmol/L (ref 22–32)

## 2023-09-04 LAB — LIPASE, BLOOD: Lipase: 23 U/L (ref 11–51)

## 2023-09-04 LAB — PREPARE RBC (CROSSMATCH)

## 2023-09-04 LAB — HEMOGLOBIN AND HEMATOCRIT, BLOOD
HCT: 27 % — ABNORMAL LOW (ref 39.0–52.0)
Hemoglobin: 8.3 g/dL — ABNORMAL LOW (ref 13.0–17.0)

## 2023-09-04 LAB — GLUCOSE, CAPILLARY
Glucose-Capillary: 89 mg/dL (ref 70–99)
Glucose-Capillary: 168 mg/dL — ABNORMAL HIGH (ref 70–99)

## 2023-09-04 MED ORDER — ACETAMINOPHEN 650 MG RE SUPP: 650.0000 mg | Freq: Four times a day (QID) | RECTAL | Status: DC | PRN

## 2023-09-04 MED ORDER — POLYETHYLENE GLYCOL 3350 17 G PO PACK
17.0000 g | PACK | Freq: Every day | ORAL | Status: DC
  Filled 2023-09-04 (×4): qty 1

## 2023-09-04 MED ORDER — INSULIN GLARGINE-YFGN 100 UNIT/ML ~~LOC~~ SOLN
15.0000 [IU] | Freq: Every day | SUBCUTANEOUS | Status: DC
  Administered 2023-09-04 – 2023-09-06 (×3): 15 [IU] via SUBCUTANEOUS
  Filled 2023-09-04 (×4): qty 0.15

## 2023-09-04 MED ORDER — ALBUTEROL SULFATE (2.5 MG/3ML) 0.083% IN NEBU: 3.0000 mL | INHALATION_SOLUTION | Freq: Four times a day (QID) | RESPIRATORY_TRACT | Status: DC | PRN

## 2023-09-04 MED ORDER — ISOSORB DINITRATE-HYDRALAZINE 20-37.5 MG PO TABS
2.0000 | ORAL_TABLET | Freq: Three times a day (TID) | ORAL | Status: DC
  Administered 2023-09-04 – 2023-09-09 (×15): 2 via ORAL
  Filled 2023-09-04 (×16): qty 2

## 2023-09-04 MED ORDER — GABAPENTIN 100 MG PO CAPS
200.0000 mg | ORAL_CAPSULE | Freq: Three times a day (TID) | ORAL | Status: DC
  Administered 2023-09-04 – 2023-09-09 (×15): 200 mg via ORAL
  Filled 2023-09-04 (×15): qty 2

## 2023-09-04 MED ORDER — VITAMIN D 25 MCG (1000 UNIT) PO TABS
5000.0000 [IU] | ORAL_TABLET | Freq: Every day | ORAL | Status: DC
  Administered 2023-09-04 – 2023-09-09 (×6): 5000 [IU] via ORAL
  Filled 2023-09-04 (×6): qty 5

## 2023-09-04 MED ORDER — DOCUSATE SODIUM 100 MG PO CAPS
100.0000 mg | ORAL_CAPSULE | Freq: Two times a day (BID) | ORAL | Status: DC
  Administered 2023-09-04 – 2023-09-09 (×3): 100 mg via ORAL
  Filled 2023-09-04 (×7): qty 1

## 2023-09-04 MED ORDER — VITAMIN B-6 100 MG PO TABS
100.0000 mg | ORAL_TABLET | Freq: Every day | ORAL | Status: DC
  Administered 2023-09-04 – 2023-09-09 (×6): 100 mg via ORAL
  Filled 2023-09-04 (×6): qty 1

## 2023-09-04 MED ORDER — TRAZODONE HCL 100 MG PO TABS
100.0000 mg | ORAL_TABLET | Freq: Every day | ORAL | Status: DC
  Administered 2023-09-04 – 2023-09-08 (×5): 100 mg via ORAL
  Filled 2023-09-04 (×5): qty 1

## 2023-09-04 MED ORDER — SODIUM CHLORIDE 0.9% IV SOLUTION: Freq: Once | INTRAVENOUS | Status: DC

## 2023-09-04 MED ORDER — NITROGLYCERIN 0.4 MG SL SUBL: 0.4000 mg | SUBLINGUAL_TABLET | SUBLINGUAL | Status: DC | PRN

## 2023-09-04 MED ORDER — FENTANYL CITRATE PF 50 MCG/ML IJ SOSY
25.0000 ug | PREFILLED_SYRINGE | Freq: Once | INTRAMUSCULAR | Status: AC
  Administered 2023-09-04: 25 ug via INTRAVENOUS
  Filled 2023-09-04: qty 1

## 2023-09-04 MED ORDER — ONDANSETRON HCL 4 MG PO TABS: 4.0000 mg | ORAL_TABLET | Freq: Four times a day (QID) | ORAL | Status: DC | PRN

## 2023-09-04 MED ORDER — ONDANSETRON HCL 4 MG/2ML IJ SOLN: 4.0000 mg | Freq: Four times a day (QID) | INTRAMUSCULAR | Status: DC | PRN

## 2023-09-04 MED ORDER — INSULIN ASPART 100 UNIT/ML IJ SOLN
0.0000 [IU] | Freq: Three times a day (TID) | INTRAMUSCULAR | Status: DC
  Administered 2023-09-05: 3 [IU] via SUBCUTANEOUS
  Administered 2023-09-07: 2 [IU] via SUBCUTANEOUS
  Administered 2023-09-07 – 2023-09-08 (×2): 1 [IU] via SUBCUTANEOUS

## 2023-09-04 MED ORDER — MELATONIN 3 MG PO TABS
3.0000 mg | ORAL_TABLET | Freq: Every day | ORAL | Status: DC
  Administered 2023-09-04 – 2023-09-08 (×5): 3 mg via ORAL
  Filled 2023-09-04 (×5): qty 1

## 2023-09-04 MED ORDER — METOPROLOL SUCCINATE ER 100 MG PO TB24
100.0000 mg | ORAL_TABLET | Freq: Every day | ORAL | Status: DC
  Administered 2023-09-04 – 2023-09-09 (×4): 100 mg via ORAL
  Filled 2023-09-04 (×5): qty 1

## 2023-09-04 MED ORDER — LATANOPROST 0.005 % OP SOLN
1.0000 [drp] | Freq: Every day | OPHTHALMIC | Status: DC
  Administered 2023-09-04 – 2023-09-08 (×5): 1 [drp] via OPHTHALMIC
  Filled 2023-09-04: qty 2.5

## 2023-09-04 MED ORDER — ASPIRIN 81 MG PO TBEC
81.0000 mg | DELAYED_RELEASE_TABLET | Freq: Every day | ORAL | Status: DC
  Administered 2023-09-04 – 2023-09-05 (×2): 81 mg via ORAL
  Filled 2023-09-04 (×2): qty 1

## 2023-09-04 MED ORDER — ATORVASTATIN CALCIUM 40 MG PO TABS
80.0000 mg | ORAL_TABLET | Freq: Every day | ORAL | Status: DC
  Administered 2023-09-04 – 2023-09-08 (×5): 80 mg via ORAL
  Filled 2023-09-04 (×5): qty 2

## 2023-09-04 MED ORDER — TORSEMIDE 20 MG PO TABS
20.0000 mg | ORAL_TABLET | Freq: Two times a day (BID) | ORAL | Status: DC
  Administered 2023-09-04 – 2023-09-05 (×3): 20 mg via ORAL
  Filled 2023-09-04 (×4): qty 1

## 2023-09-04 MED ORDER — FENTANYL CITRATE PF 50 MCG/ML IJ SOSY: 50.0000 ug | PREFILLED_SYRINGE | INTRAMUSCULAR | Status: DC | PRN

## 2023-09-04 MED ORDER — ACETAMINOPHEN 325 MG PO TABS
650.0000 mg | ORAL_TABLET | Freq: Four times a day (QID) | ORAL | Status: DC | PRN
  Filled 2023-09-04: qty 2

## 2023-09-04 MED ORDER — ENSURE ENLIVE PO LIQD: 237.0000 mL | Freq: Two times a day (BID) | ORAL | Status: DC

## 2023-09-04 NOTE — ED Notes (Signed)
 Blood bank has 1 unit of blood ready for this patient. Sophonie,RN notified.

## 2023-09-04 NOTE — ED Triage Notes (Signed)
 Intermittent abd pain x 1-2 months. Constipation x 4 days despite use of laxatives and attempts at disimpaction from facility.

## 2023-09-04 NOTE — H&P (Signed)
 History and Physical    Patient: David [name] BJY:[id number] DOB: [birth year] DOA: 09/04/2023 DOS: the patient was seen and examined on 09/04/2023 PCP: Merl Star, MD  Patient coming from: Home  Chief Complaint:  Chief Complaint  Patient presents with   Constipation   HPI: [name] is a 78 y.o. male with medical history significant of osteoarthritis, CAD, status post stent placement, chronic systolic heart failure, stage IIIb CKD, type 2 diabetes, diabetic neuropathy, diabetic retinopathy, GERD, glaucoma, hypertension who presented to the emergency department with complaints of constipation.  He was also found to be anemic with a hemoglobin around 6.7 g/dL.  His last bowel movement was 4 days ago.  He has tried MiraLAX  and stool softeners without significant results.  No nausea, vomiting, melena or hematochezia.  No dysuria, frequency or hematuria.  No chest pain, dyspnea or lower extremity edema.  Lab work: CBC showed white count 8.6, hemoglobin 6.7 g/dL platelets 086.  CMP showed normal electrolytes and glucose after calcium  correction.  BUN 69 and creatinine 2.71 mg/dL.  Total protein was 3.0 g/dL and alkaline phosphatase 128 mg/dL, the rest of the LFTs were normal.  Imaging: CT abdomen/pelvis without contrast with no acute inflammatory process in the abdomen.  Aortic atherosclerosis.  Small right pleural effusion and atelectasis of the right lower lung.   ED course: 97.9 F, pulse 55, respiration 18, BP 128/64 mmHg O2 sat 100% on room air.  The patient received fentanyl  25 mcg IVP x 1 and an enema.  Review of Systems: As mentioned in the history of present illness. All other systems reviewed and are negative. Past Medical History:  Diagnosis Date   Arthritis    CAD in native artery    s/p stent in 11/18   CHF (congestive heart failure) (HCC)    normal echo in 11/18   CKD (chronic kidney disease)    DDD (degenerative disc disease), cervical    Diabetes mellitus with  complication (HCC)    Type II   Diabetic neuropathy (HCC)    Diabetic retinopathy (HCC)    GERD (gastroesophageal reflux disease)    Glaucoma    Hypertension    Past Surgical History:  Procedure Laterality Date   AMPUTATION Right 07/09/2016   Procedure: Right Great Toe Amputation at Metatarsophalangeal Joint;  Surgeon: Timothy Ford, MD;  Location: [facility] OR;  Service: Orthopedics;  Laterality: Right;   AMPUTATION Right 05/28/2018   Procedure: RIGHT SECOND TOE AMPUTATION;  Surgeon: Timothy Ford, MD;  Location: [facility] OR;  Service: Orthopedics;  Laterality: Right;   AMPUTATION Left 08/03/2020   Procedure: LEFT 5TH RAY AMPUTATION;  Surgeon: Timothy Ford, MD;  Location: [facility] OR;  Service: Orthopedics;  Laterality: Left;   BACK SURGERY     4   CARDIAC CATHETERIZATION     CORONARY STENT INTERVENTION N/A 04/06/2017   Procedure: CORONARY STENT INTERVENTION;  Surgeon: Cody Das, MD;  Location: [facility];  Service: Cardiovascular;  Laterality: N/A;   CORONARY STENT INTERVENTION N/A 04/07/2017   Procedure: CORONARY STENT INTERVENTION;  Surgeon: Cody Das, MD;  Location: [facility];  Service: Cardiovascular;  Laterality: N/A;   CYST EXCISION     on Back   ENDOTRACHEAL INTUBATION EMERGENT  08/07/2018       JOINT REPLACEMENT     LAPAROSCOPIC ABDOMINAL EXPLORATION N/A 11/26/2017   Procedure: LAPAROSCOPIC ABDOMINAL EXPLORATION, DRAINAGE OF APPENDICEAL ABCESS. PLACEMENT OF DRAIN;  Surgeon: Juanita Norlander, MD;  Location: [facility] OR;  Service: General;  Laterality: N/A;   RIGHT/LEFT HEART CATH AND CORONARY ANGIOGRAPHY N/A 04/06/2017   Procedure: RIGHT/LEFT HEART CATH AND CORONARY ANGIOGRAPHY;  Surgeon: Cody Das, MD;  Location: [facility];  Service: Cardiovascular;  Laterality: N/A;   TOE SURGERY  05/2018   Left    TOTAL HIP ARTHROPLASTY     Right    Social History:  reports that he quit smoking about 29 years ago. His smoking use included cigarettes. He  started smoking about 34 years ago. He has a 1.3 pack-year smoking history. He has been exposed to tobacco smoke. He has never used smokeless tobacco. He reports that he does not currently use alcohol. He reports that he does not use drugs.  No Known Allergies  Family History  Problem Relation Age of Onset   Diabetes Other    Hyperlipidemia Other    Hypertension Other    Stroke Other    Alzheimer's disease Other    Thyroid  disease Mother    Diabetes Mellitus II Father    Alzheimer's disease Father     Prior to Admission medications   Medication Sig Start Date End Date Taking? Authorizing Provider  acetaminophen  (TYLENOL ) 500 MG tablet Take 1,000 mg by mouth in the morning and at bedtime.   Yes [provider]  albuterol  (PROVENTIL  HFA;VENTOLIN  HFA) 108 (90 Base) MCG/ACT inhaler Inhale 2 puffs into the lungs every 6 (six) hours as needed for wheezing or shortness of breath. 04/09/17  Yes Justina Oman, MD  amLODipine  (NORVASC ) 10 MG tablet Take 10 mg by mouth daily. 05/29/23  Yes [provider]  aspirin  EC 81 MG tablet Take 81 mg by mouth daily.   Yes [provider]  atorvastatin  (LIPITOR ) 80 MG tablet Take 80 mg by mouth at bedtime.   Yes [provider]  bisacodyl (DULCOLAX) 10 MG suppository Place 10 mg rectally daily as needed for moderate constipation.   Yes [provider]  bismuth subsalicylate (PEPTO BISMOL) 262 MG/15ML suspension Take 30 mLs by mouth daily as needed for diarrhea or loose stools.   Yes [provider]  cholecalciferol  (VITAMIN D3) 25 MCG (1000 UNIT) tablet Take 5,000 Units by mouth daily.   Yes [provider]  cyanocobalamin (VITAMIN B12) 1000 MCG tablet Take 2,000 mcg by mouth daily.   Yes [provider]  dextromethorphan-guaiFENesin  (MUCINEX  DM) 30-600 MG 12hr tablet Take 1 tablet by mouth 2 (two) times daily.   Yes [provider]  dicyclomine  (BENTYL ) 10 MG capsule Take 10 mg  by mouth in the morning, at noon, in the evening, and at bedtime.   Yes [provider]  docusate sodium  (COLACE) 100 MG capsule Take 100 mg by mouth 2 (two) times daily.   Yes [provider]  ferrous sulfate  325 (65 FE) MG tablet Take 325 mg by mouth daily with breakfast.   Yes [provider]  gabapentin  (NEURONTIN ) 100 MG capsule Take 200 mg by mouth 3 (three) times daily.   Yes [provider]  HYDROcodone -acetaminophen  (NORCO/VICODIN) 5-325 MG tablet Take 1 tablet by mouth every 6 (six) hours as needed for severe pain (pain score 7-10). Patient taking differently: Take 1 tablet by mouth every 5 (five) hours as needed for severe pain (pain score 7-10). 05/25/23  Yes Deforest Fast, MD  insulin  glargine-yfgn (SEMGLEE ) 100 UNIT/ML injection Inject 15 Units into the skin daily.   Yes [provider]  isosorbide -hydrALAZINE  (BIDIL ) 20-37.5 MG tablet Take 2 tablets by  mouth 3 (three) times daily. 05/25/23  Yes Deforest Fast, MD  latanoprost  (XALATAN ) 0.005 % ophthalmic solution Place 1 drop into both eyes at bedtime. 04/10/23  Yes [provider]  melatonin 3 MG TABS tablet Take 3 mg by mouth at bedtime.   Yes [provider]  metoprolol  succinate (TOPROL -XL) 100 MG 24 hr tablet Take 100 mg by mouth daily. 05/29/23  Yes [provider]  Multiple Vitamin (MULTIVITAMIN WITH MINERALS) TABS tablet Take 1 tablet by mouth daily. 08/16/18  Yes Sheikh, Omair Latif, DO  nitroGLYCERIN  (NITROSTAT ) 0.4 MG SL tablet Place 1 tablet (0.4 mg total) under the tongue every 5 (five) minutes as needed for chest pain. 05/02/22  Yes Patwardhan, Manish J, MD  ondansetron  (ZOFRAN ) 4 MG tablet Take 4 mg by mouth every 6 (six) hours as needed for nausea or vomiting.   Yes [provider]  OXYGEN Inhale 3 L into the lungs continuous.   Yes [provider]  polyethylene glycol powder (GLYCOLAX /MIRALAX ) 17 GM/SCOOP powder Take 1 capful (17 g)  by mouth daily. Patient taking differently: Take 17 g by mouth daily. PRN order: 17 g by mouth daily as needed for constipation 05/25/23  Yes Deforest Fast, MD  pyridoxine  (B-6) 100 MG tablet Take 100 mg by mouth daily.   Yes [provider]  torsemide  (DEMADEX ) 20 MG tablet Take 1 tablet (20 mg total) by mouth 2 (two) times daily. 05/25/23  Yes Deforest Fast, MD  traZODone  (DESYREL ) 100 MG tablet Take 100 mg by mouth at bedtime. 05/29/23  Yes [provider]  UNABLE TO FIND Take 30 mLs by mouth in the morning and at bedtime. Med Name: protein liquid   Yes [provider]  guaiFENesin  (MUCINEX ) 600 MG 12 hr tablet Take 600 mg by mouth 2 (two) times daily. Patient not taking: Reported on 09/04/2023    [provider]    Physical Exam: Vitals:   09/04/23 1128 09/04/23 1144 09/04/23 1159 09/04/23 1230  BP: 135/86 139/64 134/66 (!) 141/65  Pulse: 61 (!) 56 (!) 56 (!) 56  Resp: 16 12 14 15   Temp: 97.9 F (36.6 C) 97.6 F (36.4 C) 97.6 F (36.4 C)   TempSrc: Oral Oral Oral   SpO2: 100% 100% 100% 100%   Physical Exam Vitals reviewed.  Constitutional:      General: He is awake.     Appearance: He is obese. He is ill-appearing.  HENT:     Head: Normocephalic.     Nose: No rhinorrhea.     Mouth/Throat:     Mouth: Mucous membranes are moist.  Eyes:     General: No scleral icterus.    Pupils: Pupils are equal, round, and reactive to light.  Neck:     Vascular: No JVD.  Cardiovascular:     Rate and Rhythm: Normal rate and regular rhythm.     Heart sounds: S1 normal and S2 normal.  Pulmonary:     Effort: Pulmonary effort is normal.     Breath sounds: Normal breath sounds. No wheezing, rhonchi or rales.  Abdominal:     General: Bowel sounds are normal. There is no distension.     Palpations: Abdomen is soft.     Tenderness: There is abdominal tenderness. There is no right CVA tenderness, left CVA tenderness, guarding or rebound.  Musculoskeletal:      Cervical back: Neck supple.     Right lower leg: No edema.     Left lower leg: No  edema.  Skin:    General: Skin is warm and dry.     Comments: Pretibial skin with hyperkeratosis and hyperpigmentation.  Neurological:     General: No focal deficit present.     Mental Status: He is alert and oriented to person, place, and time.  Psychiatric:        Mood and Affect: Mood normal.        Behavior: Behavior normal. Behavior is cooperative.     Data Reviewed:  Results are pending, will review when available. 05/05/2023 echocardiogram. IMPRESSIONS:   1. Left ventricular ejection fraction, by estimation, is 40 to 45%. The  left ventricle has mildly decreased function. The left ventricle has no  regional wall motion abnormalities. There is mild concentric left  ventricular hypertrophy. Left ventricular  diastolic parameters are indeterminate.   2. Right ventricular systolic function is moderately reduced. The right  ventricular size is mildly enlarged. There is moderately elevated  pulmonary artery systolic pressure. The estimated right ventricular  systolic pressure is 51.0 mmHg.   3. Left atrial size was mild to moderately dilated.   4. Right atrial size was moderately dilated.   5. The mitral valve is normal in structure. No evidence of mitral valve  regurgitation. No evidence of mitral stenosis.   6. Tricuspid valve regurgitation is mild to moderate.   7. The aortic valve is tricuspid. There is mild calcification of the  aortic valve. Aortic valve regurgitation is not visualized. No aortic  stenosis is present.   8. Aortic dilatation noted. There is borderline dilatation of the  ascending aorta, measuring 38 mm.   9. The inferior vena cava is dilated in size with <50% respiratory  variability, suggesting right atrial pressure of 15 mmHg.    Assessment and Plan: Principal Problem:   Symptomatic anemia Posttransfusion hemoglobin over 8. Will follow hemoglobin in the  morning. Will transfuse further as needed.  Active Problems:   Chronic combined systolic and diastolic congestive heart failure (HCC)  Currently euvolemic. Continue metoprolol  and BiDil .    Essential hypertension Continue metoprolol  succinate 100 mg p.o. daily. Continue BiDil  3 times daily.    Coronary artery disease involving native  coronary artery of native heart without angina pectoris Continue beta-blocker, aspirin  and statin. Sublingual nitroglycerin  as needed.    Stage 3b chronic kidney disease (HCC) Monitor renal function and electrolytes.    Type 2 diabetes mellitus with hyperlipidemia (HCC) Carbohydrate modified diet. Continue Semglee  15 units SQ daily. CBG monitoring with RI SS. Check hemoglobin A1c. On atorvastatin .    Diabetic polyneuropathy associated with type 2 diabetes mellitus (HCC) Continue gabapentin  200 mg p.o. 3 times daily.       Advance Care Planning:   Code Status: Full Code   Consults:   Family Communication:   Severity of Illness: The appropriate patient status for this patient is OBSERVATION. Observation status is judged to be reasonable and necessary in order to provide the required intensity of service to ensure the patient's safety. The patient's presenting symptoms, physical exam findings, and initial radiographic and laboratory data in the context of their medical condition is felt to place them at decreased risk for further clinical deterioration. Furthermore, it is anticipated that the patient will be medically stable for discharge from the hospital within 2 midnights of admission.   Author: Danice Dural, MD 09/04/2023 1:55 PM  For on call review [email or web address].   This document was prepared using Dragon voice recognition software and may contain some unintended transcription errors.

## 2023-09-04 NOTE — ED Provider Notes (Signed)
 I provided a substantive portion of the care of this patient.  I personally made/approved the management plan for this patient and take responsibility for the patient management.      78 year old male presents with constipation but also having increased weakness.  Hemoglobin was 6.7.  Patient to be transfused.  Will consult hospitalist for admission   Lind Repine, MD 09/04/23 1343

## 2023-09-04 NOTE — ED Notes (Signed)
 Patient refused to let me take temperature. He told me to "get out and leave me alone."

## 2023-09-04 NOTE — ED Provider Notes (Signed)
 [place] EMERGENCY DEPARTMENT AT [facility] Provider Note   CSN: [id number] Arrival date & time: 09/04/23  6295     History  Chief Complaint  Patient presents with   Constipation    David [name] is a 78 y.o. male with PMHx OA, CAD, CHF, CKD, DM, diabetic neuropathy/retinopathy, GERD, HTN, anemia of chronic disease who presents to ED concerned for intermittent abdominal pain and constipation x1-2 months. Last BM 4 days ago. Patient has attempted laxatives and attempts of disimpaction from facility.  Denies fever, chest pain, dyspnea, cough, nausea, vomiting, diarrhea, dysuria, hematuria, hematochezia.  I spoke with patient's RN at [facility]. They endorse that patient has been having severe abdominal cramping that seems to be correlated with constipation. Patient has been given Miralax  and Dulcolax at facility. RN denies any other recent infectious symptoms for the patient. Apparently patient declines suppositories but was agreeable to digital disimpaction.    Constipation      Home Medications Prior to Admission medications   Medication Sig Start Date End Date Taking? Authorizing Provider  acetaminophen  (TYLENOL ) 500 MG tablet Take 1,000 mg by mouth in the morning and at bedtime.   Yes [provider]  albuterol  (PROVENTIL  HFA;VENTOLIN  HFA) 108 (90 Base) MCG/ACT inhaler Inhale 2 puffs into the lungs every 6 (six) hours as needed for wheezing or shortness of breath. 04/09/17  Yes Justina Oman, MD  amLODipine  (NORVASC ) 10 MG tablet Take 10 mg by mouth daily. 05/29/23  Yes [provider]  aspirin  EC 81 MG tablet Take 81 mg by mouth daily.   Yes [provider]  atorvastatin  (LIPITOR ) 80 MG tablet Take 80 mg by mouth at bedtime.   Yes [provider]  bisacodyl (DULCOLAX) 10 MG suppository Place 10 mg rectally daily as needed for moderate constipation.   Yes [provider]  bismuth subsalicylate (PEPTO BISMOL)  262 MG/15ML suspension Take 30 mLs by mouth daily as needed for diarrhea or loose stools.   Yes [provider]  cholecalciferol  (VITAMIN D3) 25 MCG (1000 UNIT) tablet Take 5,000 Units by mouth daily.   Yes [provider]  cyanocobalamin (VITAMIN B12) 1000 MCG tablet Take 2,000 mcg by mouth daily.   Yes [provider]  dextromethorphan-guaiFENesin  (MUCINEX  DM) 30-600 MG 12hr tablet Take 1 tablet by mouth 2 (two) times daily.   Yes [provider]  dicyclomine  (BENTYL ) 10 MG capsule Take 10 mg by mouth in the morning, at noon, in the evening, and at bedtime.   Yes [provider]  docusate sodium  (COLACE) 100 MG capsule Take 100 mg by mouth 2 (two) times daily.   Yes [provider]  ferrous sulfate  325 (65 FE) MG tablet Take 325 mg by mouth daily with breakfast.   Yes [provider]  gabapentin  (NEURONTIN ) 100 MG capsule Take 200 mg by mouth 3 (three) times daily.   Yes [provider]  HYDROcodone -acetaminophen  (NORCO/VICODIN) 5-325 MG tablet Take 1 tablet by mouth every 6 (six) hours as needed for severe pain (pain score 7-10). Patient taking differently: Take 1 tablet by mouth every 5 (five) hours as needed for severe pain (pain score 7-10). 05/25/23  Yes Deforest Fast, MD  insulin  glargine-yfgn (SEMGLEE ) 100 UNIT/ML injection Inject 15 Units into the skin daily.   Yes [provider]  isosorbide -hydrALAZINE  (BIDIL ) 20-37.5 MG tablet Take 2 tablets by mouth 3 (three) times daily. 05/25/23  Yes Deforest Fast, MD  latanoprost  (XALATAN ) 0.005 % ophthalmic solution  Place 1 drop into both eyes at bedtime. 04/10/23  Yes [provider]  melatonin 3 MG TABS tablet Take 3 mg by mouth at bedtime.   Yes [provider]  metoprolol  succinate (TOPROL -XL) 100 MG 24 hr tablet Take 100 mg by mouth daily. 05/29/23  Yes [provider]  Multiple Vitamin (MULTIVITAMIN WITH MINERALS) TABS tablet Take 1 tablet  by mouth daily. 08/16/18  Yes Sheikh, Omair Latif, DO  nitroGLYCERIN  (NITROSTAT ) 0.4 MG SL tablet Place 1 tablet (0.4 mg total) under the tongue every 5 (five) minutes as needed for chest pain. 05/02/22  Yes Patwardhan, Manish J, MD  ondansetron  (ZOFRAN ) 4 MG tablet Take 4 mg by mouth every 6 (six) hours as needed for nausea or vomiting.   Yes [provider]  OXYGEN Inhale 3 L into the lungs continuous.   Yes [provider]  polyethylene glycol powder (GLYCOLAX /MIRALAX ) 17 GM/SCOOP powder Take 1 capful (17 g) by mouth daily. Patient taking differently: Take 17 g by mouth daily. PRN order: 17 g by mouth daily as needed for constipation 05/25/23  Yes Deforest Fast, MD  pyridoxine  (B-6) 100 MG tablet Take 100 mg by mouth daily.   Yes [provider]  torsemide  (DEMADEX ) 20 MG tablet Take 1 tablet (20 mg total) by mouth 2 (two) times daily. 05/25/23  Yes Deforest Fast, MD  traZODone  (DESYREL ) 100 MG tablet Take 100 mg by mouth at bedtime. 05/29/23  Yes [provider]  UNABLE TO FIND Take 30 mLs by mouth in the morning and at bedtime. Med Name: protein liquid   Yes [provider]  guaiFENesin  (MUCINEX ) 600 MG 12 hr tablet Take 600 mg by mouth 2 (two) times daily. Patient not taking: Reported on 09/04/2023    [provider]      Allergies    Patient has no known allergies.    Review of Systems   Review of Systems  Gastrointestinal:  Positive for constipation.    Physical Exam Updated Vital Signs BP (!) 141/73   Pulse 65   Temp 97.7 F (36.5 C) (Oral)   Resp 20   SpO2 100%  Physical Exam Vitals and nursing note reviewed.  Constitutional:      General: He is not in acute distress.    Appearance: He is not ill-appearing or toxic-appearing.  HENT:     Head: Normocephalic and atraumatic.     Mouth/Throat:     Mouth: Mucous membranes are moist.  Eyes:     General: No scleral icterus.       Right eye: No discharge.        Left eye:  No discharge.     Conjunctiva/sclera: Conjunctivae normal.  Cardiovascular:     Rate and Rhythm: Normal rate and regular rhythm.     Pulses: Normal pulses.     Heart sounds: Normal heart sounds. No murmur heard. Pulmonary:     Effort: Pulmonary effort is normal. No respiratory distress.     Breath sounds: Normal breath sounds. No wheezing, rhonchi or rales.  Abdominal:     General: Abdomen is flat. Bowel sounds are normal. There is no distension.     Palpations: Abdomen is soft. There is no mass.     Tenderness: There is abdominal tenderness.     Comments: Mild abdominal tenderness to palpation  Musculoskeletal:     Right lower leg: No edema.     Left lower leg: No edema.  Skin:    General: Skin  is warm and dry.     Findings: No rash.  Neurological:     General: No focal deficit present.     Mental Status: He is alert and oriented to person, place, and time. Mental status is at baseline.  Psychiatric:        Mood and Affect: Mood normal.     ED Results / Procedures / Treatments   Labs (all labs ordered are listed, but only abnormal results are displayed) Labs Reviewed  COMPREHENSIVE METABOLIC PANEL WITH GFR - Abnormal; Notable for the following components:      Result Value   BUN 69 (*)    Creatinine, Ser 2.71 (*)    Calcium  8.4 (*)    Albumin  3.0 (*)    Alkaline Phosphatase 128 (*)    GFR, Estimated 24 (*)    All other components within normal limits  CBC WITH DIFFERENTIAL/PLATELET - Abnormal; Notable for the following components:   RBC 1.91 (*)    Hemoglobin 6.7 (*)    HCT 22.6 (*)    MCV 118.3 (*)    MCH 35.1 (*)    MCHC 29.6 (*)    All other components within normal limits  I-STAT CHEM 8, ED - Abnormal; Notable for the following components:   BUN 64 (*)    Creatinine, Ser 3.60 (*)    Calcium , Ion 1.13 (*)    Hemoglobin 6.8 (*)    HCT 20.0 (*)    All other components within normal limits  LIPASE, BLOOD  TYPE AND SCREEN  PREPARE RBC (CROSSMATCH)     EKG None  Radiology CT ABDOMEN PELVIS WO CONTRAST Result Date: 09/04/2023 CLINICAL DATA:  Abdominal pain, acute, nonlocalized. EXAM: CT ABDOMEN AND PELVIS WITHOUT CONTRAST TECHNIQUE: Multidetector CT imaging of the abdomen and pelvis was performed following the standard protocol without IV contrast. RADIATION DOSE REDUCTION: This exam was performed according to the departmental dose-optimization program which includes automated exposure control, adjustment of the mA and/or kV according to patient size and/or use of iterative reconstruction technique. COMPARISON:  CT scan abdomen and pelvis from 12/03/2017. FINDINGS: Lower chest: Moderate cardiomegaly. No pericardial effusion. There are coronary artery atherosclerotic calcifications, in keeping with coronary artery disease. There is apparent hypoattenuation of the blood pool relative to the myocardium, suggestive of anemia. There is small right pleural effusion with associated rounded atelectasis in the right lung lower lobe. There also atelectatic changes in the middle lobe and left lung lower lobe. No left pleural effusion. Hepatobiliary: The liver is normal in size. Non-cirrhotic configuration. No suspicious mass. No intrahepatic or extrahepatic bile duct dilation. Very small volume dependent calcified gallstones/sludge noted. No imaging evidence of acute cholecystitis. No abnormal wall thickening. No pericholecystic fat stranding. Pancreas: Small/atrophic pancreas. No focal mass. No peripancreatic fat stranding. Spleen: Within normal limits. No focal lesion. Adrenals/Urinary Tract: Adrenal glands are unremarkable. No suspicious renal mass within the limitations of this unenhanced exam. No nephroureterolithiasis or obstructive uropathy. Unremarkable urinary bladder. Stomach/Bowel: No disproportionate dilation of the small or large bowel loops. No evidence of abnormal bowel wall thickening or inflammatory changes. The appendix is unremarkable.  Vascular/Lymphatic: No ascites or pneumoperitoneum. There is mild residual fat stranding in the central lower abdomen, overall significantly decreased since the prior study from 12/03/2017. No abdominal or pelvic lymphadenopathy, by size criteria. No aneurysmal dilation of the major abdominal arteries. There are mild peripheral atherosclerotic vascular calcifications of the aorta and its major branches. Reproductive: Normal size prostate. Evaluation is markedly limited due to  extensive streak artifacts from right hip hardware. Other: There is a tiny fat containing umbilical hernia. The soft tissues and abdominal wall are otherwise unremarkable. Musculoskeletal: No suspicious osseous lesions. There are mild multilevel degenerative changes in the visualized spine. IMPRESSION: 1. No acute inflammatory process identified within the abdomen or pelvis. 2. Small right pleural effusion with associated rounded atelectasis in the right lung lower lobe. 3. Multiple other nonacute observations, as described above. 4. Aortic atherosclerosis. Aortic Atherosclerosis (ICD10-I70.0). Electronically Signed   By: Beula Brunswick M.D.   On: 09/04/2023 10:40    Procedures Procedures    Medications Ordered in ED Medications  0.9 %  sodium chloride  infusion (Manually program via Guardrails IV Fluids) (0 mLs Intravenous Hold 09/04/23 0933)  acetaminophen  (TYLENOL ) tablet 650 mg (has no administration in time range)    Or  acetaminophen  (TYLENOL ) suppository 650 mg (has no administration in time range)  ondansetron  (ZOFRAN ) tablet 4 mg (has no administration in time range)    Or  ondansetron  (ZOFRAN ) injection 4 mg (has no administration in time range)  fentaNYL  (SUBLIMAZE ) injection 50 mcg (has no administration in time range)  fentaNYL  (SUBLIMAZE ) injection 25 mcg (25 mcg Intravenous Given 09/04/23 1052)    ED Course/ Medical Decision Making/ A&P                                 Medical Decision Making Amount and/or  Complexity of Data Reviewed Labs: ordered. Radiology: ordered.  Risk Prescription drug management. Decision regarding hospitalization.    This patient presents to the ED for concern of abdominal pain, this involves an extensive number of treatment options, and is a complaint that carries with it a high risk of complications and morbidity.  The differential diagnosis includes gastroenteritis, colitis, small bowel obstruction, appendicitis, cholecystitis, pancreatitis, nephrolithiasis, UTI, pyleonephritis, testicular torsion.   Co morbidities that complicate the patient evaluation  OA, CAD, CHF, CKD, DM, diabetic neuropathy/retinopathy, GERD, HTN, anemia of chronic disease   Additional history obtained:  05/2023 Discharge note: anemia panel suggestive of chronic disease    Problem List / ED Course / Critical interventions / Medication management  Admitting patient for CKD and anemia requiring blood transfusion. Patient presented for intermittent abdominal pain associated with constipation x1-2 months. Patient had mild abdominal tenderness to palpation on physical exam. Rest of physical exam reassuring. Patient afebrile with stable vitals. I Ordered, and personally interpreted labs.  CBC with anemia at 6.7. no leukocytosis. CMP with BUN/Cr near patient's baseline - currently 69/2.71. ALP slightly elevated at 128. I ordered imaging studies including CT Abd/Pelvis wo contrast: evaluate for structural/surgical etiology of patients' severe abdominal pain. I independently visualized and interpreted imaging and I agree with the radiologist interpretation of no acute process. I have reviewed the patients home medicines and have made adjustments as needed Staffed with Dr. Leighton Punches. Dr. Bonita Bussing adimtting provider.   Social Determinants of Health:  SNF resident          Final Clinical Impression(s) / ED Diagnoses Final diagnoses:  Anemia of chronic disease    Rx / DC Orders ED  Discharge Orders     None         Don Fritter 09/04/23 1516    Lind Repine, MD 09/07/23 1023

## 2023-09-05 ENCOUNTER — Encounter (HOSPITAL_COMMUNITY): Payer: Self-pay | Admitting: Nephrology

## 2023-09-05 DIAGNOSIS — E785 Hyperlipidemia, unspecified: Secondary | ICD-10-CM

## 2023-09-05 DIAGNOSIS — N1832 Chronic kidney disease, stage 3b: Secondary | ICD-10-CM | POA: Diagnosis not present

## 2023-09-05 DIAGNOSIS — I10 Essential (primary) hypertension: Secondary | ICD-10-CM | POA: Diagnosis not present

## 2023-09-05 DIAGNOSIS — D649 Anemia, unspecified: Secondary | ICD-10-CM | POA: Diagnosis not present

## 2023-09-05 DIAGNOSIS — I5042 Chronic combined systolic (congestive) and diastolic (congestive) heart failure: Secondary | ICD-10-CM

## 2023-09-05 DIAGNOSIS — E1169 Type 2 diabetes mellitus with other specified complication: Secondary | ICD-10-CM

## 2023-09-05 LAB — COMPREHENSIVE METABOLIC PANEL WITH GFR
ALT: 21 U/L (ref 0–44)
AST: 18 U/L (ref 15–41)
Albumin: 2.9 g/dL — ABNORMAL LOW (ref 3.5–5.0)
Alkaline Phosphatase: 113 U/L (ref 38–126)
Anion gap: 9 (ref 5–15)
BUN: 69 mg/dL — ABNORMAL HIGH (ref 8–23)
CO2: 23 mmol/L (ref 22–32)
Calcium: 8.1 mg/dL — ABNORMAL LOW (ref 8.9–10.3)
Chloride: 106 mmol/L (ref 98–111)
Creatinine, Ser: 3.25 mg/dL — ABNORMAL HIGH (ref 0.61–1.24)
GFR, Estimated: 19 mL/min — ABNORMAL LOW (ref >60)
Glucose, Bld: 113 mg/dL — ABNORMAL HIGH (ref 70–99)
Potassium: 4.3 mmol/L (ref 3.5–5.1)
Sodium: 138 mmol/L (ref 135–145)
Total Bilirubin: 0.7 mg/dL (ref 0.0–1.2)
Total Protein: 7 g/dL (ref 6.5–8.1)

## 2023-09-05 LAB — GLUCOSE, CAPILLARY
Glucose-Capillary: 97 mg/dL (ref 70–99)
Glucose-Capillary: 101 mg/dL — ABNORMAL HIGH (ref 70–99)
Glucose-Capillary: 213 mg/dL — ABNORMAL HIGH (ref 70–99)
Glucose-Capillary: 141 mg/dL — ABNORMAL HIGH (ref 70–99)

## 2023-09-05 LAB — CBC
HCT: 22.6 % — ABNORMAL LOW (ref 39.0–52.0)
Hemoglobin: 6.8 g/dL — CL (ref 13.0–17.0)
MCH: 34.2 pg — ABNORMAL HIGH (ref 26.0–34.0)
MCHC: 30.1 g/dL (ref 30.0–36.0)
MCV: 113.6 fL — ABNORMAL HIGH (ref 80.0–100.0)
Platelets: 237 10*3/uL (ref 150–400)
RBC: 1.99 MIL/uL — ABNORMAL LOW (ref 4.22–5.81)
RDW: 16.4 % — ABNORMAL HIGH (ref 11.5–15.5)
WBC: 7.4 10*3/uL (ref 4.0–10.5)
nRBC: 0 % (ref 0.0–0.2)

## 2023-09-05 LAB — PREPARE RBC (CROSSMATCH)

## 2023-09-05 LAB — HEMOGLOBIN AND HEMATOCRIT, BLOOD
HCT: 28.8 % — ABNORMAL LOW (ref 39.0–52.0)
Hemoglobin: 9 g/dL — ABNORMAL LOW (ref 13.0–17.0)

## 2023-09-05 MED ORDER — SODIUM CHLORIDE 0.9 % IV BOLUS
1500.0000 mL | Freq: Once | INTRAVENOUS | Status: AC
Start: 2023-09-05 — End: 2023-09-06
  Administered 2023-09-06: 1500 mL via INTRAVENOUS

## 2023-09-05 MED ORDER — SODIUM CHLORIDE 0.9 % IV SOLN: INTRAVENOUS | Status: AC

## 2023-09-05 MED ORDER — SODIUM CHLORIDE 0.9% IV SOLUTION: Freq: Once | INTRAVENOUS | Status: AC

## 2023-09-05 NOTE — Progress Notes (Signed)
 [facility]  PROGRESS NOTE  EDGER HUSAIN ZHY:[id number] DOB: 09/04/1945 DOA: 09/04/2023 PCP: Merl Star, MD   Brief HPI:   78 y.o. male with medical history significant of osteoarthritis, CAD, status post stent placement, chronic systolic heart failure, stage IIIb CKD, type 2 diabetes, diabetic neuropathy, diabetic retinopathy, GERD, glaucoma, hypertension who presented to the emergency department with complaints of constipation.  He was also found to be anemic with a hemoglobin around 6.7 g/dL.  His last bowel movement was 4 days ago  CT abdomen/pelvis without contrast with no acute inflammatory process in the abdomen. Aortic atherosclerosis. Small right pleural effusion and atelectasis of the right lower lung.    Assessment/Plan:   Symptomatic anemia -Status post 1 unit PRBC, hemoglobin 6.8 this morning -Getting another unit of PRBC -Follow CBC in a.m. -Will check FOBT - Not sure if this is due to anemia of chronic disease due to underlying CKD stage III    Active Problems:   Chronic combined systolic and diastolic congestive heart failure (HCC)  Currently euvolemic. Continue metoprolol  and BiDil .     Essential hypertension Continue metoprolol  succinate 100 mg p.o. daily. Continue BiDil  3 times daily.     Coronary artery disease involving native  coronary artery of native heart without angina pectoris Continue beta-blocker, aspirin  and statin. Sublingual nitroglycerin  as needed.     Stage 3b chronic kidney disease (HCC) Monitor renal function and electrolytes.     Type 2 diabetes mellitus with hyperlipidemia (HCC) Carbohydrate modified diet. Continue Semglee  15 units SQ daily. CBG monitoring with RI SS. Check hemoglobin A1c. On atorvastatin .     Diabetic polyneuropathy associated with type 2 diabetes mellitus (HCC) Continue gabapentin  200 mg p.o. 3 times daily.    Medications     sodium chloride    Intravenous Once   sodium chloride    Intravenous  Once   aspirin  EC  81 mg Oral Daily   atorvastatin   80 mg Oral QHS   cholecalciferol   5,000 Units Oral Daily   docusate sodium   100 mg Oral BID   feeding supplement  237 mL Oral BID BM   gabapentin   200 mg Oral TID   insulin  aspart  0-9 Units Subcutaneous TID WC   insulin  glargine-yfgn  15 Units Subcutaneous Daily   isosorbide -hydrALAZINE   2 tablet Oral TID   latanoprost   1 drop Both Eyes QHS   melatonin  3 mg Oral QHS   metoprolol  succinate  100 mg Oral Daily   polyethylene glycol  17 g Oral Daily   pyridoxine   100 mg Oral Daily   torsemide   20 mg Oral BID   traZODone   100 mg Oral QHS     Data Reviewed:   CBG:  Recent Labs  Lab 09/04/23 1532 09/04/23 2147 09/05/23 0807  GLUCAP 89 168* 97    SpO2: 99 % O2 Flow Rate (L/min): 2 L/min    Vitals:   09/04/23 1549 09/04/23 1857 09/04/23 2338 09/05/23 0447  BP:  131/60 136/61 (!) 118/58  Pulse:  69 66 (!) 54  Resp:  20 17 18   Temp:  97.7 F (36.5 C) 98.6 F (37 C) 98.2 F (36.8 C)  TempSrc:  Oral    SpO2:  98% 97% 99%  Weight: 88.5 kg     Height: 6\' 2"  (1.88 m)         Data Reviewed:  Basic Metabolic Panel: Recent Labs  Lab 09/04/23 0805 09/04/23 0855 09/05/23 0358  NA 138 139 138  K 4.6 4.6  4.3  CL 103 106 106  CO2 24  --  23  GLUCOSE 81 82 113*  BUN 69* 64* 69*  CREATININE 2.71* 3.60* 3.25*  CALCIUM  8.4*  --  8.1*    CBC: Recent Labs  Lab 09/04/23 0805 09/04/23 0855 09/04/23 1629 09/05/23 0358  WBC 8.6  --   --  7.4  NEUTROABS 6.8  --   --   --   HGB 6.7* 6.8* 8.3* 6.8*  HCT 22.6* 20.0* 27.0* 22.6*  MCV 118.3*  --   --  113.6*  PLT 255  --   --  237    LFT Recent Labs  Lab 09/04/23 0805 09/05/23 0358  AST 21 18  ALT 28 21  ALKPHOS 128* 113  BILITOT 0.7 0.7  PROT 7.8 7.0  ALBUMIN  3.0* 2.9*     Antibiotics: Anti-infectives (From admission, onward)    None        DVT prophylaxis: SCDs  Code Status: Full code  Family Communication: No family at bedside   CONSULTS     Subjective   Denies any complaints   Objective    Physical Examination:   General-appears in no acute distress Heart-S1-S2, regular, no murmur auscultated Lungs-clear to auscultation bilaterally, no wheezing or crackles auscultated Abdomen-soft, nontender, no organomegaly Extremities-no edema in the lower extremities Neuro-alert, oriented x3, no focal deficit noted  Status is: Inpatient:      Pressure Injury 05/12/23 Heel Left Deep Tissue Pressure Injury - Purple or maroon localized area of discolored intact skin or blood-filled blister due to damage of underlying soft tissue from pressure and/or shear. (Active)  05/12/23 0400  Location: Heel  Location Orientation: Left  Staging: Deep Tissue Pressure Injury - Purple or maroon localized area of discolored intact skin or blood-filled blister due to damage of underlying soft tissue from pressure and/or shear.  Wound Description (Comments):   Present on Admission: Yes        Tahiry Spicer S Mikaili Flippin   [facility] If 7PM-7AM, please contact night-coverage at [email or web address], Office  [phone]   09/05/2023, 8:14 AM  LOS: 0 days

## 2023-09-05 NOTE — Care Management Obs Status (Signed)
 MEDICARE OBSERVATION STATUS NOTIFICATION   Patient Details  Name: David [name] MRN: [id number] Date of Birth: [birth year]   Medicare Observation Status Notification Given:  Yes    [name], RN 09/05/2023, 2:21 PM

## 2023-09-05 NOTE — TOC Initial Note (Signed)
 Transition of Care [facility]) - Initial/Assessment Note    Patient Details  Name: David [name] MRN: [id number] Date of Birth: 04/17/1946  Transition of Care [facility]) CM/SW Contact:    David Ream, RN Phone Number: 09/05/2023, 4:01 PM  Clinical Narrative:                 TOC for d/c planning; spoke w/ pt in room; pt says he is from [facility]; he identified POC spouse David [name] [phone]); will need PTAR for transport; pt verified insurance; he says he does not have a PCP; pt denied SDOH risks; pt says he has cane; chart reviewed showed he previously had home oxygen w/ [employer]; TOC will follow.  Expected Discharge Plan: Skilled Nursing Facility Barriers to Discharge: Continued Medical Work up   Patient Goals and CMS Choice Patient states their goals for this hospitalization and ongoing recovery are:: facility CMS Medicare.gov Compare Post Acute Care list provided to:: Patient   [place] ownership interest in [facility].provided to:: Patient    Expected Discharge Plan and Services   Discharge Planning Services: CM Consult   Living arrangements for the past 2 months: Skilled Nursing Facility                                      Prior Living Arrangements/Services Living arrangements for the past 2 months: Skilled Nursing Facility Lives with:: Facility Resident Patient language and need for interpreter reviewed:: Yes Do you feel safe going back to the place where you live?: Yes      Need for Family Participation in Patient Care: Yes (Comment) Care giver support system in place?: Yes (comment) Current home services: DME (cane, oxygen) Criminal Activity/Legal Involvement Pertinent to Current Situation/Hospitalization: No - Comment as needed  Activities of Daily Living   ADL Screening (condition at time of admission) Independently performs ADLs?: No Does the patient have a NEW difficulty with bathing/dressing/toileting/self-feeding that  is expected to last >3 days?: No Does the patient have a NEW difficulty with getting in/out of bed, walking, or climbing stairs that is expected to last >3 days?: No Does the patient have a NEW difficulty with communication that is expected to last >3 days?: No Is the patient deaf or have difficulty hearing?: No Does the patient have difficulty seeing, even when wearing glasses/contacts?: Yes Does the patient have difficulty concentrating, remembering, or making decisions?: No  Permission Sought/Granted Permission sought to share information with : Case Manager Permission granted to share information with : Yes, Verbal Permission Granted  Share Information with NAME: Case Manager     Permission granted to share info w Relationship: David [name] (spouse) [phone]     Emotional Assessment Appearance:: Appears stated age Attitude/Demeanor/Rapport: Gracious Affect (typically observed): Accepting Orientation: : Oriented to Self, Oriented to Place, Oriented to  Time, Oriented to Situation Alcohol / Substance Use: Not Applicable Psych Involvement: No (comment)  Admission diagnosis:  Symptomatic anemia [D64.9] Patient Active Problem List   Diagnosis Date Noted   Symptomatic anemia 09/04/2023   Chronic combined systolic and diastolic congestive heart failure (HCC) 09/04/2023   CHF (congestive heart failure) (HCC) 05/12/2023   Malnutrition of moderate degree 05/06/2023   Diabetic foot infection (HCC) 05/05/2023   Sacral wound 05/05/2023   Pleural effusion on right 05/05/2023   Elevated TSH 05/05/2023   Acute on chronic diastolic (congestive) heart failure (HCC) 05/05/2023   Diastolic CHF (  HCC) 05/04/2023   Class 1 obesity 06/30/2022   Acute on chronic diastolic CHF (congestive heart failure) (HCC) 06/29/2022   PAD (peripheral artery disease) (HCC) 06/13/2022   Critical limb ischemia of left lower extremity (HCC) 03/21/2021   Nonhealing ulcer of left lower extremity limited to  breakdown of skin (HCC) 03/21/2021   Abscess of left foot 08/03/2020   Osteomyelitis of fifth toe of left foot (HCC)    Mixed hyperlipidemia 10/15/2018   Bilateral leg edema 09/17/2018   Stage 3b chronic kidney disease (HCC) 09/10/2018   Anemia 09/10/2018   Pain and swelling of wrist, right 08/14/2018   Bilateral carotid artery stenosis 07/30/2018   Coronary artery disease involving native coronary artery of native heart without angina pectoris 07/30/2018   Snoring 02/11/2018   Chronic heart failure with preserved ejection fraction (HFpEF) (HCC) 12/22/2017   At risk for adverse drug reaction 12/15/2017   Chronic pain syndrome 12/15/2017   Status post coronary artery stent placement    Multifocal pneumonia 03/30/2017   Type 2 diabetes mellitus with hyperlipidemia (HCC) 03/29/2017   Acute respiratory failure with hypoxemia (HCC) 03/29/2017   Acute on chronic respiratory failure with hypoxia (HCC)    Pulmonary congestion    Acquired contracture of Achilles tendon, right 08/19/2016   Amputated great toe, right (HCC) 07/18/2016   Onychomycosis 05/29/2016   Diabetic polyneuropathy associated with type 2 diabetes mellitus (HCC) 04/09/2016   Right foot ulcer, limited to breakdown of skin (HCC) 04/09/2016   Spinal stenosis of lumbar region without neurogenic claudication 05/24/2014   Hereditary and idiopathic peripheral neuropathy 09/15/2013   Malaise 08/14/2013   Essential hypertension 08/14/2013   Chronic back pain 08/14/2013   Glaucoma 08/14/2013   Headache 08/14/2013   PCP:  David Star, MD Pharmacy:   [name] [address] [place] [address] Phone: [phone] Fax: [phone]     Social Drivers of Health (SDOH) Social History: SDOH Screenings   Food Insecurity: No Food Insecurity (09/05/2023)  Housing: Low Risk  (09/05/2023)  Transportation Needs: No Transportation Needs (09/05/2023)  Utilities: Not At Risk (09/05/2023)  Alcohol  Screen: Low Risk  (05/18/2023)  Financial Resource Strain: Low Risk  (05/18/2023)  Physical Activity: Inactive (08/20/2017)  Social Connections: Socially Integrated (09/04/2023)  Stress: No Stress Concern Present (08/20/2017)  Tobacco Use: Medium Risk (09/04/2023)   SDOH Interventions: Food Insecurity Interventions: Intervention Not Indicated, Inpatient TOC Housing Interventions: Intervention Not Indicated, Inpatient TOC Transportation Interventions: Intervention Not Indicated, Inpatient TOC Utilities Interventions: Intervention Not Indicated, Inpatient TOC   Readmission Risk Interventions     No data to display

## 2023-09-05 NOTE — Consult Note (Addendum)
 Renal Service Consult Note [facility]  David [name] 09/05/2023 David Sandifer, MD Requesting Physician: Dr. Alfonse Angle  Reason for Consult: Renal failure  HPI: The patient is a 78 y.o. year-old w/ PMH as below who presented to ED yesterday w/ c/o abd pain x 1-85mos. Also constipation, they attempted disimpaction from the facility. In ED Hb 6.7 g/dL, wbc 8.6, plt 865. BUN 69, creat 2.71. Total prot 3.0, alk phos 128.  No n/v. No CP or SOB. HR 55, RR 18, BP 128/64. CT a/p showed no acute process. In ED HR 55, RR 18, BP 128/64, 100% on RA, afebrile. Pt was admitted for symptomatic anemia. We are asked to see for renal failure.   Pt seen in room. He has no c/o's at this time. No SOB, swelling. Listening to pop music.  BP's are wnl 120- 140 range HR 60s, RR 17  Chronic 3 L [place] O2 at home and here CT abd noncontrast --> No suspicious renal mass within the limitations of this unenhanced exam. No nephroureterolithiasis or obstructive uropathy. Stable urinary bladder.    ROS - denies CP, no joint pain, no HA, no blurry vision, no rash, no diarrhea, no nausea/ vomiting  PMH: CAD CKD Diabetes mellitus type 2 Diabetic retinopathy and neuropathy Glaucoma Hypertension DDD GERD  Past Surgical History  Past Surgical History:  Procedure Laterality Date   AMPUTATION Right 07/09/2016   Procedure: Right Great Toe Amputation at Metatarsophalangeal Joint;  Surgeon: Timothy Ford, MD;  Location: [facility] OR;  Service: Orthopedics;  Laterality: Right;   AMPUTATION Right 05/28/2018   Procedure: RIGHT SECOND TOE AMPUTATION;  Surgeon: Timothy Ford, MD;  Location: [facility] OR;  Service: Orthopedics;  Laterality: Right;   AMPUTATION Left 08/03/2020   Procedure: LEFT 5TH RAY AMPUTATION;  Surgeon: Timothy Ford, MD;  Location[facility] OR;  Service: Orthopedics;  Laterality: Left;   BACK SURGERY     4   CARDIAC CATHETERIZATION     CORONARY STENT INTERVENTION N/A 04/06/2017   Procedure: CORONARY STENT  INTERVENTION;  Surgeon: Cody Das, MD;  Location: [facility] INVASIVE CV LAB;  Service: Cardiovascular;  Laterality: N/A;   CORONARY STENT INTERVENTION N/A 04/07/2017   Procedure: CORONARY STENT INTERVENTION;  Surgeon: Cody Das, MD;  Location: [facility] INVASIVE CV LAB;  Service: Cardiovascular;  Laterality: N/A;   CYST EXCISION     on Back   ENDOTRACHEAL INTUBATION EMERGENT  08/07/2018       JOINT REPLACEMENT     LAPAROSCOPIC ABDOMINAL EXPLORATION N/A 11/26/2017   Procedure: LAPAROSCOPIC ABDOMINAL EXPLORATION, DRAINAGE OF APPENDICEAL ABCESS. PLACEMENT OF DRAIN;  Surgeon: Juanita Norlander, MD;  Location: [facility] OR;  Service: General;  Laterality: N/A;   RIGHT/LEFT HEART CATH AND CORONARY ANGIOGRAPHY N/A 04/06/2017   Procedure: RIGHT/LEFT HEART CATH AND CORONARY ANGIOGRAPHY;  Surgeon: Cody Das, MD;  Location: [facility] INVASIVE CV LAB;  Service: Cardiovascular;  Laterality: N/A;   TOE SURGERY  05/2018   Left    TOTAL HIP ARTHROPLASTY     Right    Family History  Family History  Problem Relation Age of Onset   Diabetes Other    Hyperlipidemia Other    Hypertension Other    Stroke Other    Alzheimer's disease Other    Thyroid  disease Mother    Diabetes Mellitus II Father    Alzheimer's disease Father    Social History  reports that he quit smoking about 29 years ago. His smoking use included cigarettes. He started  smoking about 34 years ago. He has a 1.3 pack-year smoking history. He has been exposed to tobacco smoke. He has never used smokeless tobacco. He reports that he does not currently use alcohol. He reports that he does not use drugs. Allergies No Known Allergies Home medications Prior to Admission medications   Medication Sig Start Date End Date Taking? Authorizing Provider  acetaminophen  (TYLENOL ) 500 MG tablet Take 1,000 mg by mouth in the morning and at bedtime.   Yes [provider]  albuterol  (PROVENTIL  HFA;VENTOLIN  HFA) 108 (90 Base) MCG/ACT inhaler Inhale  2 puffs into the lungs every 6 (six) hours as needed for wheezing or shortness of breath. 04/09/17  Yes Justina Oman, MD  amLODipine  (NORVASC ) 10 MG tablet Take 10 mg by mouth daily. 05/29/23  Yes [provider]  aspirin  EC 81 MG tablet Take 81 mg by mouth daily.   Yes [provider]  atorvastatin  (LIPITOR ) 80 MG tablet Take 80 mg by mouth at bedtime.   Yes [provider]  bisacodyl (DULCOLAX) 10 MG suppository Place 10 mg rectally daily as needed for moderate constipation.   Yes [provider]  bismuth subsalicylate (PEPTO BISMOL) 262 MG/15ML suspension Take 30 mLs by mouth daily as needed for diarrhea or loose stools.   Yes [provider]  cholecalciferol  (VITAMIN D3) 25 MCG (1000 UNIT) tablet Take 5,000 Units by mouth daily.   Yes [provider]  cyanocobalamin (VITAMIN B12) 1000 MCG tablet Take 2,000 mcg by mouth daily.   Yes [provider]  dextromethorphan-guaiFENesin  (MUCINEX  DM) 30-600 MG 12hr tablet Take 1 tablet by mouth 2 (two) times daily.   Yes [provider]  dicyclomine  (BENTYL ) 10 MG capsule Take 10 mg by mouth in the morning, at noon, in the evening, and at bedtime.   Yes [provider]  docusate sodium  (COLACE) 100 MG capsule Take 100 mg by mouth 2 (two) times daily.   Yes [provider]  ferrous sulfate  325 (65 FE) MG tablet Take 325 mg by mouth daily with breakfast.   Yes [provider]  gabapentin  (NEURONTIN ) 100 MG capsule Take 200 mg by mouth 3 (three) times daily.   Yes [provider]  HYDROcodone -acetaminophen  (NORCO/VICODIN) 5-325 MG tablet Take 1 tablet by mouth every 6 (six) hours as needed for severe pain (pain score 7-10). Patient taking differently: Take 1 tablet by mouth every 5 (five) hours as needed for severe pain (pain score 7-10). 05/25/23  Yes Deforest Fast, MD  insulin  glargine-yfgn (SEMGLEE ) 100 UNIT/ML injection Inject 15 Units into the skin  daily.   Yes [provider]  isosorbide -hydrALAZINE  (BIDIL ) 20-37.5 MG tablet Take 2 tablets by mouth 3 (three) times daily. 05/25/23  Yes Deforest Fast, MD  latanoprost  (XALATAN ) 0.005 % ophthalmic solution Place 1 drop into both eyes at bedtime. 04/10/23  Yes [provider]  melatonin 3 MG TABS tablet Take 3 mg by mouth at bedtime.   Yes [provider]  metoprolol  succinate (TOPROL -XL) 100 MG 24 hr tablet Take 100 mg by mouth daily. 05/29/23  Yes [provider]  Multiple Vitamin (MULTIVITAMIN WITH MINERALS) TABS tablet Take 1 tablet by mouth daily. 08/16/18  Yes Sheikh, Omair Latif, DO  nitroGLYCERIN  (NITROSTAT ) 0.4 MG SL tablet Place 1 tablet (0.4 mg total) under the tongue every 5 (five) minutes as needed for chest pain. 05/02/22  Yes Patwardhan, Manish J, MD  ondansetron  (ZOFRAN ) 4 MG tablet Take 4 mg by mouth every 6 (six)  hours as needed for nausea or vomiting.   Yes [provider]  OXYGEN Inhale 3 L into the lungs continuous.   Yes [provider]  polyethylene glycol powder (GLYCOLAX /MIRALAX ) 17 GM/SCOOP powder Take 1 capful (17 g) by mouth daily. Patient taking differently: Take 17 g by mouth daily. PRN order: 17 g by mouth daily as needed for constipation 05/25/23  Yes Deforest Fast, MD  pyridoxine  (B-6) 100 MG tablet Take 100 mg by mouth daily.   Yes [provider]  torsemide  (DEMADEX ) 20 MG tablet Take 1 tablet (20 mg total) by mouth 2 (two) times daily. 05/25/23  Yes Deforest Fast, MD  traZODone  (DESYREL ) 100 MG tablet Take 100 mg by mouth at bedtime. 05/29/23  Yes [provider]  UNABLE TO FIND Take 30 mLs by mouth in the morning and at bedtime. Med Name: protein liquid   Yes [provider]  guaiFENesin  (MUCINEX ) 600 MG 12 hr tablet Take 600 mg by mouth 2 (two) times daily. Patient not taking: Reported on 09/04/2023    [provider]     Vitals:   09/05/23 0907 09/05/23 0940 09/05/23  1325 09/05/23 1502  BP: 134/65 (!) 132/58 (!) 118/55 (!) 128/53  Pulse: (!) 57 (!) 55 62 65  Resp: 18 18 16    Temp: 98.5 F (36.9 C) 98.1 F (36.7 C) 98.2 F (36.8 C) 98.2 F (36.8 C)  TempSrc: Oral Oral Oral   SpO2: 100%  100% 98%  Weight:      Height:       Exam Gen alert, no distress, 3 L [place] Looks bedbound, moves upper torso okay  but not some much movement of the legs No rash, cyanosis or gangrene Sclera anicteric, throat clear  No jvd or bruits Chest clear bilat to bases RRR no RG Abd soft ntnd no mass or ascites +bs GU nl male MS no joint effusions or deformity Ext chronic skin changes, no pitting LE edema, no other edema Neuro is alert, Ox 3 , nf    Renal-related home meds: Amlodipine  10 daily Home oxygen 3 L nasal cannula Isosorbide -hydralazine  20-37.5mg   take 2 tablets 3 times a day Toprol -XL 100 daily Demadex  20 mg twice daily Others: Xarelto, B6, eyedrops, insulin , Neurontin , Colace, statin, aspirin , 3 L of oxygen continuous, Norco, Bentyl , Mucinex  DM  Date   Creat  eGFR (ml/min) 2015    0.71- 1.28  2016   1.84 >> 0.87 2018   1.25- 2.07 July-aug 2019  5.91 --> 1.71 AKI episode 2020   1.52- 2.78 2021   2.34 2022   1.98 Dec 2023  2.22 Jun 2022  1.87- 2.86 22- 37 ml/min Dec-May 24 2022 1.93- 3.17 20- 35 ml/min  05/30/23   2.11  32  ml/min 09/04/23  2.71, 3.60  09/05/23  3.25   UA 05/12/23 - 100 prot, 0-5 rbc/ wbc/ epis  UNa, UCr pending  CT abd / pelvis noncon - 4/18: Urinary Tract: Adrenal glands are unremarkable. No suspicious renal mass within the limitations of this unenhanced exam. No nephroureterolithiasis or obstructive uropathy. Unremarkable urinary bladder. K+ 4.3, BUN 69 , creat 3.25 , alb 2.9  Hb 9.0, WBC 7K  plt 237    Assessment/ Plan: AKI on CKD 3b: b/l creat is 1.8- 2.1 from late 2024 and jan 2025, eGFR 32- 37 ml/min. Creat here 2.7 on admission and up to 3.6 overnight and down to 3.25 today in the setting of symptomatic anemia. UA  negative, urine lytes pending.  CT abd showed no obstruction. No hypotension. Unclear cause of AKI, possibly vol depletion related to torsemide . Will plan bolus 1.5 L and give IVF's at 65 cc/hr overnight. Will follow.  Chronic diastolic CHF: has been in hospital 2-3 times for acute diast CHF in the last few yrs. Looks euvolemic here. Will hold demadex  and give IVF"s.  HTN: on bidil , toprol , norvasc  at home. Cont here as needed.  Chronic resp failure: on 3 L [place] O2 at home Symptomatic anemia: sp prbc's per pmd DM2: on insulin  Debility: may be SNF dependent, might be bedbound      David Poag  MD [facility] 09/05/2023, 6:43 PM  Recent Labs  Lab 09/04/23 0805 09/04/23 0855 09/04/23 1629 09/05/23 0358 09/05/23 1706  HGB 6.7* 6.8*   < > 6.8* 9.0*  ALBUMIN  3.0*  --   --  2.9*  --   CALCIUM  8.4*  --   --  8.1*  --   CREATININE 2.71* 3.60*  --  3.25*  --   K 4.6 4.6  --  4.3  --    < > = values in this interval not displayed.   Inpatient medications:  sodium chloride    Intravenous Once   atorvastatin   80 mg Oral QHS   cholecalciferol   5,000 Units Oral Daily   docusate sodium   100 mg Oral BID   feeding supplement  237 mL Oral BID BM   gabapentin   200 mg Oral TID   insulin  aspart  0-9 Units Subcutaneous TID WC   insulin  glargine-yfgn  15 Units Subcutaneous Daily   isosorbide -hydrALAZINE   2 tablet Oral TID   latanoprost   1 drop Both Eyes QHS   melatonin  3 mg Oral QHS   metoprolol  succinate  100 mg Oral Daily   polyethylene glycol  17 g Oral Daily   pyridoxine   100 mg Oral Daily   torsemide   20 mg Oral BID   traZODone   100 mg Oral QHS    acetaminophen  **OR** acetaminophen , albuterol , fentaNYL  (SUBLIMAZE ) injection, nitroGLYCERIN , ondansetron  **OR** ondansetron  (ZOFRAN ) IV

## 2023-09-06 ENCOUNTER — Encounter (HOSPITAL_COMMUNITY): Payer: Self-pay

## 2023-09-06 DIAGNOSIS — Z79899 Other long term (current) drug therapy: Secondary | ICD-10-CM | POA: Diagnosis not present

## 2023-09-06 DIAGNOSIS — J9611 Chronic respiratory failure with hypoxia: Secondary | ICD-10-CM | POA: Diagnosis present

## 2023-09-06 DIAGNOSIS — I700 Atherosclerosis of aorta: Secondary | ICD-10-CM | POA: Diagnosis present

## 2023-09-06 DIAGNOSIS — Z9981 Dependence on supplemental oxygen: Secondary | ICD-10-CM | POA: Diagnosis not present

## 2023-09-06 DIAGNOSIS — J9811 Atelectasis: Secondary | ICD-10-CM | POA: Diagnosis present

## 2023-09-06 DIAGNOSIS — I130 Hypertensive heart and chronic kidney disease with heart failure and stage 1 through stage 4 chronic kidney disease, or unspecified chronic kidney disease: Secondary | ICD-10-CM | POA: Diagnosis present

## 2023-09-06 DIAGNOSIS — M542 Cervicalgia: Secondary | ICD-10-CM | POA: Diagnosis present

## 2023-09-06 DIAGNOSIS — E785 Hyperlipidemia, unspecified: Secondary | ICD-10-CM | POA: Diagnosis present

## 2023-09-06 DIAGNOSIS — I5042 Chronic combined systolic (congestive) and diastolic (congestive) heart failure: Secondary | ICD-10-CM | POA: Diagnosis present

## 2023-09-06 DIAGNOSIS — N1832 Chronic kidney disease, stage 3b: Secondary | ICD-10-CM | POA: Diagnosis present

## 2023-09-06 DIAGNOSIS — E11649 Type 2 diabetes mellitus with hypoglycemia without coma: Secondary | ICD-10-CM | POA: Diagnosis not present

## 2023-09-06 DIAGNOSIS — Z794 Long term (current) use of insulin: Secondary | ICD-10-CM | POA: Diagnosis not present

## 2023-09-06 DIAGNOSIS — J90 Pleural effusion, not elsewhere classified: Secondary | ICD-10-CM | POA: Diagnosis present

## 2023-09-06 DIAGNOSIS — Z8249 Family history of ischemic heart disease and other diseases of the circulatory system: Secondary | ICD-10-CM | POA: Diagnosis not present

## 2023-09-06 DIAGNOSIS — Z87891 Personal history of nicotine dependence: Secondary | ICD-10-CM | POA: Diagnosis not present

## 2023-09-06 DIAGNOSIS — E1122 Type 2 diabetes mellitus with diabetic chronic kidney disease: Secondary | ICD-10-CM | POA: Diagnosis present

## 2023-09-06 DIAGNOSIS — I2510 Atherosclerotic heart disease of native coronary artery without angina pectoris: Secondary | ICD-10-CM | POA: Diagnosis present

## 2023-09-06 DIAGNOSIS — I10 Essential (primary) hypertension: Secondary | ICD-10-CM | POA: Diagnosis not present

## 2023-09-06 DIAGNOSIS — D649 Anemia, unspecified: Secondary | ICD-10-CM | POA: Diagnosis not present

## 2023-09-06 DIAGNOSIS — K5900 Constipation, unspecified: Secondary | ICD-10-CM | POA: Diagnosis present

## 2023-09-06 DIAGNOSIS — E1169 Type 2 diabetes mellitus with other specified complication: Secondary | ICD-10-CM | POA: Diagnosis present

## 2023-09-06 DIAGNOSIS — E11319 Type 2 diabetes mellitus with unspecified diabetic retinopathy without macular edema: Secondary | ICD-10-CM | POA: Diagnosis present

## 2023-09-06 DIAGNOSIS — Z955 Presence of coronary angioplasty implant and graft: Secondary | ICD-10-CM | POA: Diagnosis not present

## 2023-09-06 DIAGNOSIS — E1142 Type 2 diabetes mellitus with diabetic polyneuropathy: Secondary | ICD-10-CM | POA: Diagnosis present

## 2023-09-06 DIAGNOSIS — D631 Anemia in chronic kidney disease: Secondary | ICD-10-CM | POA: Diagnosis present

## 2023-09-06 DIAGNOSIS — N179 Acute kidney failure, unspecified: Secondary | ICD-10-CM | POA: Diagnosis present

## 2023-09-06 LAB — COMPREHENSIVE METABOLIC PANEL WITH GFR
ALT: 36 U/L (ref 0–44)
AST: 50 U/L — ABNORMAL HIGH (ref 15–41)
Albumin: 2.9 g/dL — ABNORMAL LOW (ref 3.5–5.0)
Alkaline Phosphatase: 149 U/L — ABNORMAL HIGH (ref 38–126)
Anion gap: 8 (ref 5–15)
BUN: 69 mg/dL — ABNORMAL HIGH (ref 8–23)
CO2: 23 mmol/L (ref 22–32)
Calcium: 8.3 mg/dL — ABNORMAL LOW (ref 8.9–10.3)
Chloride: 106 mmol/L (ref 98–111)
Creatinine, Ser: 3.06 mg/dL — ABNORMAL HIGH (ref 0.61–1.24)
GFR, Estimated: 20 mL/min — ABNORMAL LOW (ref >60)
Glucose, Bld: 142 mg/dL — ABNORMAL HIGH (ref 70–99)
Potassium: 4.1 mmol/L (ref 3.5–5.1)
Sodium: 137 mmol/L (ref 135–145)
Total Bilirubin: 0.6 mg/dL (ref 0.0–1.2)
Total Protein: 7.3 g/dL (ref 6.5–8.1)

## 2023-09-06 LAB — URINALYSIS, ROUTINE W REFLEX MICROSCOPIC
Bacteria, UA: NONE SEEN
Bilirubin Urine: NEGATIVE
Glucose, UA: NEGATIVE mg/dL
Ketones, ur: NEGATIVE mg/dL
Leukocytes,Ua: NEGATIVE
Nitrite: NEGATIVE
Protein, ur: 30 mg/dL — AB
Specific Gravity, Urine: 1.008 (ref 1.005–1.030)
pH: 5 (ref 5.0–8.0)

## 2023-09-06 LAB — SODIUM, URINE, RANDOM: Sodium, Ur: 106 mmol/L

## 2023-09-06 LAB — CBC
HCT: 28.2 % — ABNORMAL LOW (ref 39.0–52.0)
Hemoglobin: 8.8 g/dL — ABNORMAL LOW (ref 13.0–17.0)
MCH: 33.7 pg (ref 26.0–34.0)
MCHC: 31.2 g/dL (ref 30.0–36.0)
MCV: 108 fL — ABNORMAL HIGH (ref 80.0–100.0)
Platelets: 241 10*3/uL (ref 150–400)
RBC: 2.61 MIL/uL — ABNORMAL LOW (ref 4.22–5.81)
RDW: 18.8 % — ABNORMAL HIGH (ref 11.5–15.5)
WBC: 7 10*3/uL (ref 4.0–10.5)
nRBC: 0 % (ref 0.0–0.2)

## 2023-09-06 LAB — CREATININE, URINE, RANDOM: Creatinine, Urine: 33 mg/dL

## 2023-09-06 LAB — GLUCOSE, CAPILLARY
Glucose-Capillary: 121 mg/dL — ABNORMAL HIGH (ref 70–99)
Glucose-Capillary: 108 mg/dL — ABNORMAL HIGH (ref 70–99)
Glucose-Capillary: 93 mg/dL (ref 70–99)
Glucose-Capillary: 104 mg/dL — ABNORMAL HIGH (ref 70–99)

## 2023-09-06 MED ORDER — HYDROCODONE-ACETAMINOPHEN 5-325 MG PO TABS
1.0000 | ORAL_TABLET | Freq: Four times a day (QID) | ORAL | Status: AC | PRN
  Administered 2023-09-06 (×2): 1 via ORAL
  Filled 2023-09-06 (×2): qty 1

## 2023-09-06 MED ORDER — HYDROCODONE-ACETAMINOPHEN 5-325 MG PO TABS
1.0000 | ORAL_TABLET | Freq: Four times a day (QID) | ORAL | Status: AC | PRN
  Administered 2023-09-06 – 2023-09-07 (×2): 1 via ORAL
  Filled 2023-09-06 (×2): qty 1

## 2023-09-06 NOTE — Progress Notes (Signed)
 [facility]  PROGRESS NOTE  David [name] UJW:[id number] DOB: 03-Dec-1945 DOA: 09/04/2023 PCP: Merl Star, MD   Brief HPI:   78 y.o. male with medical history significant of osteoarthritis, CAD, status post stent placement, chronic systolic heart failure, stage IIIb CKD, type 2 diabetes, diabetic neuropathy, diabetic retinopathy, GERD, glaucoma, hypertension who presented to the emergency department with complaints of constipation.  He was also found to be anemic with a hemoglobin around 6.7 g/dL.  His last bowel movement was 4 days ago  CT abdomen/pelvis without contrast with no acute inflammatory process in the abdomen. Aortic atherosclerosis. Small right pleural effusion and atelectasis of the right lower lung.    Assessment/Plan:   Symptomatic anemia -Status post 2 unit PRBC, hemoglobin 8.8 this morning -Getting another unit of PRBC -Follow CBC in a.m. -Will check FOBT     Active Problems:   Chronic combined systolic and diastolic congestive heart failure (HCC)  Currently euvolemic. Continue metoprolol  and BiDil .     Essential hypertension Continue metoprolol  succinate 100 mg p.o. daily. Continue BiDil  3 times daily.     Coronary artery disease involving native  coronary artery of native heart without angina pectoris Continue beta-blocker, aspirin  and statin. Sublingual nitroglycerin  as needed.   Acute kidney injury on stage 3b chronic kidney disease [facility]) Nephrology consulted -Creatinine has improved to 3.06 with gentle IV hydration     Type 2 diabetes mellitus with hyperlipidemia (HCC) Carbohydrate modified diet. Continue Semglee  15 units SQ daily. CBG monitoring with RI SS. Check hemoglobin A1c. On atorvastatin .     Diabetic polyneuropathy associated with type 2 diabetes mellitus (HCC) Continue gabapentin  200 mg p.o. 3 times daily.    Medications     sodium chloride    Intravenous Once   atorvastatin   80 mg Oral QHS   cholecalciferol   5,000  Units Oral Daily   docusate sodium   100 mg Oral BID   feeding supplement  237 mL Oral BID BM   gabapentin   200 mg Oral TID   insulin  aspart  0-9 Units Subcutaneous TID WC   insulin  glargine-yfgn  15 Units Subcutaneous Daily   isosorbide -hydrALAZINE   2 tablet Oral TID   latanoprost   1 drop Both Eyes QHS   melatonin  3 mg Oral QHS   metoprolol  succinate  100 mg Oral Daily   polyethylene glycol  17 g Oral Daily   pyridoxine   100 mg Oral Daily   traZODone   100 mg Oral QHS     Data Reviewed:   CBG:  Recent Labs  Lab 09/05/23 0807 09/05/23 1140 09/05/23 1639 09/05/23 2039 09/06/23 0735  GLUCAP 97 101* 213* 141* 121*    SpO2: 100 % O2 Flow Rate (L/min): 3 L/min    Vitals:   09/05/23 1502 09/05/23 2042 09/06/23 0500 09/06/23 0500  BP: (!) 128/53 139/66  (!) 145/66  Pulse: 65 64  (!) 55  Resp:  18  19  Temp: 98.2 F (36.8 C) 98.2 F (36.8 C)  98.6 F (37 C)  TempSrc:  Oral  Oral  SpO2: 98% 97%  100%  Weight:   88.3 kg   Height:          Data Reviewed:  Basic Metabolic Panel: Recent Labs  Lab 09/04/23 0805 09/04/23 0855 09/05/23 0358 09/06/23 0404  NA 138 139 138 137  K 4.6 4.6 4.3 4.1  CL 103 106 106 106  CO2 24  --  23 23  GLUCOSE 81 82 113* 142*  BUN 69* 64* 69*  69*  CREATININE 2.71* 3.60* 3.25* 3.06*  CALCIUM  8.4*  --  8.1* 8.3*    CBC: Recent Labs  Lab 09/04/23 0805 09/04/23 0855 09/04/23 1629 09/05/23 0358 09/05/23 1706 09/06/23 0404  WBC 8.6  --   --  7.4  --  7.0  NEUTROABS 6.8  --   --   --   --   --   HGB 6.7* 6.8* 8.3* 6.8* 9.0* 8.8*  HCT 22.6* 20.0* 27.0* 22.6* 28.8* 28.2*  MCV 118.3*  --   --  113.6*  --  108.0*  PLT 255  --   --  237  --  241    LFT Recent Labs  Lab 09/04/23 0805 09/05/23 0358 09/06/23 0404  AST 21 18 50*  ALT 28 21 36  ALKPHOS 128* 113 149*  BILITOT 0.7 0.7 0.6  PROT 7.8 7.0 7.3  ALBUMIN  3.0* 2.9* 2.9*     Antibiotics: Anti-infectives (From admission, onward)    None        DVT  prophylaxis: SCDs  Code Status: Full code  Family Communication: No family at bedside   CONSULTS    Subjective   Denies any complaints.   Objective    Physical Examination:  General-appears in no acute distress Heart-S1-S2, regular, no murmur auscultated Lungs-clear to auscultation bilaterally, no wheezing or crackles auscultated Abdomen-soft, nontender, no organomegaly Extremities-no edema in the lower extremities Neuro-alert, oriented x3, no focal deficit noted  Status is: Inpatient:      Pressure Injury 05/12/23 Coccyx Mid Stage 2 -  Partial thickness loss of dermis presenting as a shallow open injury with a red, pink wound bed without slough. (Active)  05/12/23 0400  Location: Coccyx  Location Orientation: Mid  Staging: Stage 2 -  Partial thickness loss of dermis presenting as a shallow open injury with a red, pink wound bed without slough.  Wound Description (Comments):   Present on Admission: Yes     Pressure Injury 05/12/23 Heel Left Deep Tissue Pressure Injury - Purple or maroon localized area of discolored intact skin or blood-filled blister due to damage of underlying soft tissue from pressure and/or shear. (Active)  05/12/23 0400  Location: Heel  Location Orientation: Left  Staging: Deep Tissue Pressure Injury - Purple or maroon localized area of discolored intact skin or blood-filled blister due to damage of underlying soft tissue from pressure and/or shear.  Wound Description (Comments):   Present on Admission: Yes        Briyanna Billingham S Mistina Coatney   [facility] If 7PM-7AM, please contact night-coverage at [email or web address], Office  [phone]   09/06/2023, 8:16 AM  LOS: 0 days

## 2023-09-06 NOTE — Progress Notes (Signed)
 [facility] Progress Note  Subjective:  No c/o's today Creat down to 3.0 today  Vitals:   09/05/23 1502 09/05/23 2042 09/06/23 0500 09/06/23 0500  BP: (!) 128/53 139/66  (!) 145/66  Pulse: 65 64  (!) 55  Resp:  18  19  Temp: 98.2 F (36.8 C) 98.2 F (36.8 C)  98.6 F (37 C)  TempSrc:  Oral  Oral  SpO2: 98% 97%  100%  Weight:   88.3 kg   Height:        Exam: Gen alert, no distress, 3 L [place] (baseline) Looks bedbound perhaps Sclera anicteric, throat clear  No jvd or bruits Chest clear bilat to bases RRR no RG Abd soft ntnd no mass or ascites +bs GU nl male Ext chronic skin changes, no pitting LE edema Neuro is alert, Ox 3 , nf      Renal-related home meds: Amlodipine  10 daily Home oxygen 3 L nasal cannula Isosorbide -hydralazine  20-37.5mg   take 2 tablets 3 times a day Toprol -XL 100 daily Demadex  20 mg twice daily Others: Xarelto, B6, eyedrops, insulin , Neurontin , Colace, statin, aspirin , 3 L of oxygen continuous, Norco, Bentyl , Mucinex  DM   Date                             Creat               eGFR (ml/min) 2015                            0.71- 1.28         2016                            1.84 >> 0.87 2018                            1.25- 2.07 July-aug 2019             5.91 --> 1.71    AKI episode 2020                            1.52- 2.78 2021                            2.34 2022                            1.98 Dec 2023                     2.22 Jun 2022                     1.87- 2.86        22- 37 ml/min Dec-May 24 2022           1.93- 3.17        20- 35 ml/min   05/30/23                        2.11                 32  ml/min 09/04/23  2.71, 3.60         09/05/23                        3.25        UA pend UNa, UCr pending  CT abd / pelvis noncon - 4/18: Urinary Tract: Adrenal glands are unremarkable. No suspicious renal mass within the limitations of this unenhanced exam. No nephroureterolithiasis or obstructive uropathy.  Unremarkable urinary bladder.    Assessment/ Plan: AKI on CKD 3b: b/l creat is 1.8- 2.1 from late 2024 and jan 2025, eGFR 32- 37 ml/min. Creat here 2.7 on admission, peaked at  3.6 overnight and then dropped to 3.2 yesterday. This in the setting of symptomatic anemia. UA and urine lytes pending. CT abd showed no obstruction. No hypotension. Exam unremarkable, euvolemic. Unclear cause of AKI. Plan was to give a trial of IVF"s and holding torsemide . Creat down slightly today, no sx's of vol overload. Will cont IVF's at 50 cc/hr, f/u labs in am.  Get UA and lytes.   Chronic diastolic CHF: has been in hospital  1-2 times for acute diast CHF in the last few yrs. Looks euvolemic here. Plan as above.  HTN: on bidil , toprol , norvasc  at home. Cont here as needed. BP's good today Chronic resp failure: on 3 L [place] O2 at home Symptomatic anemia: sp prbc's per pmd DM2: on insulin  Debility: lives in SNF I believe       Larry Poag MD  [facility] 09/06/2023, 2:13 PM  Recent Labs  Lab 09/05/23 0358 09/05/23 1706 09/06/23 0404  HGB 6.8* 9.0* 8.8*  ALBUMIN  2.9*  --  2.9*  CALCIUM  8.1*  --  8.3*  CREATININE 3.25*  --  3.06*  K 4.3  --  4.1   No results for input(s): "IRON ", "TIBC", "FERRITIN" in the last 168 hours. Inpatient medications:  sodium chloride    Intravenous Once   atorvastatin   80 mg Oral QHS   cholecalciferol   5,000 Units Oral Daily   docusate sodium   100 mg Oral BID   feeding supplement  237 mL Oral BID BM   gabapentin   200 mg Oral TID   insulin  aspart  0-9 Units Subcutaneous TID WC   insulin  glargine-yfgn  15 Units Subcutaneous Daily   isosorbide -hydrALAZINE   2 tablet Oral TID   latanoprost   1 drop Both Eyes QHS   melatonin  3 mg Oral QHS   metoprolol  succinate  100 mg Oral Daily   polyethylene glycol  17 g Oral Daily   pyridoxine   100 mg Oral Daily   traZODone   100 mg Oral QHS    sodium chloride  65 mL/hr at 09/06/23 0428   acetaminophen  **OR** acetaminophen , albuterol , fentaNYL   (SUBLIMAZE ) injection, HYDROcodone -acetaminophen , nitroGLYCERIN , ondansetron  **OR** ondansetron  (ZOFRAN ) IV

## 2023-09-07 ENCOUNTER — Other Ambulatory Visit (HOSPITAL_COMMUNITY): Payer: Self-pay

## 2023-09-07 DIAGNOSIS — N1832 Chronic kidney disease, stage 3b: Secondary | ICD-10-CM | POA: Diagnosis not present

## 2023-09-07 DIAGNOSIS — I10 Essential (primary) hypertension: Secondary | ICD-10-CM | POA: Diagnosis not present

## 2023-09-07 DIAGNOSIS — E1169 Type 2 diabetes mellitus with other specified complication: Secondary | ICD-10-CM | POA: Diagnosis not present

## 2023-09-07 DIAGNOSIS — D649 Anemia, unspecified: Secondary | ICD-10-CM | POA: Diagnosis not present

## 2023-09-07 LAB — RENAL FUNCTION PANEL
Albumin: 2.6 g/dL — ABNORMAL LOW (ref 3.5–5.0)
Anion gap: 5 (ref 5–15)
BUN: 65 mg/dL — ABNORMAL HIGH (ref 8–23)
CO2: 26 mmol/L (ref 22–32)
Calcium: 8.2 mg/dL — ABNORMAL LOW (ref 8.9–10.3)
Chloride: 109 mmol/L (ref 98–111)
Creatinine, Ser: 2.61 mg/dL — ABNORMAL HIGH (ref 0.61–1.24)
GFR, Estimated: 25 mL/min — ABNORMAL LOW (ref >60)
Glucose, Bld: 71 mg/dL (ref 70–99)
Phosphorus: 5 mg/dL — ABNORMAL HIGH (ref 2.5–4.6)
Potassium: 4.1 mmol/L (ref 3.5–5.1)
Sodium: 140 mmol/L (ref 135–145)

## 2023-09-07 LAB — GLUCOSE, CAPILLARY
Glucose-Capillary: 47 mg/dL — ABNORMAL LOW (ref 70–99)
Glucose-Capillary: 66 mg/dL — ABNORMAL LOW (ref 70–99)
Glucose-Capillary: 102 mg/dL — ABNORMAL HIGH (ref 70–99)
Glucose-Capillary: 126 mg/dL — ABNORMAL HIGH (ref 70–99)
Glucose-Capillary: 128 mg/dL — ABNORMAL HIGH (ref 70–99)
Glucose-Capillary: 160 mg/dL — ABNORMAL HIGH (ref 70–99)
Glucose-Capillary: 71 mg/dL (ref 70–99)

## 2023-09-07 LAB — TYPE AND SCREEN
ABO/RH(D): O POS
Antibody Screen: NEGATIVE
Unit division: 0
Unit division: 0

## 2023-09-07 LAB — CBC
HCT: 27.9 % — ABNORMAL LOW (ref 39.0–52.0)
Hemoglobin: 8.6 g/dL — ABNORMAL LOW (ref 13.0–17.0)
MCH: 33.6 pg (ref 26.0–34.0)
MCHC: 30.8 g/dL (ref 30.0–36.0)
MCV: 109 fL — ABNORMAL HIGH (ref 80.0–100.0)
Platelets: 216 10*3/uL (ref 150–400)
RBC: 2.56 MIL/uL — ABNORMAL LOW (ref 4.22–5.81)
RDW: 17.8 % — ABNORMAL HIGH (ref 11.5–15.5)
WBC: 7.4 10*3/uL (ref 4.0–10.5)
nRBC: 0 % (ref 0.0–0.2)

## 2023-09-07 LAB — BPAM RBC
Blood Product Expiration Date: 202505202359
Blood Product Expiration Date: 202505162359
ISSUE DATE / TIME: 202504190855
ISSUE DATE / TIME: 202504181119
Unit Type and Rh: 5100
Unit Type and Rh: 5100

## 2023-09-07 MED ORDER — INSULIN GLARGINE-YFGN 100 UNIT/ML ~~LOC~~ SOLN
10.0000 [IU] | Freq: Every day | SUBCUTANEOUS | Status: DC
  Administered 2023-09-08 – 2023-09-09 (×2): 10 [IU] via SUBCUTANEOUS
  Filled 2023-09-07 (×2): qty 0.1

## 2023-09-07 MED ORDER — HYDROCODONE-ACETAMINOPHEN 5-325 MG PO TABS
1.0000 | ORAL_TABLET | Freq: Four times a day (QID) | ORAL | Status: DC | PRN
  Administered 2023-09-07 – 2023-09-08 (×3): 1 via ORAL
  Filled 2023-09-07 (×3): qty 1

## 2023-09-07 MED ORDER — TORSEMIDE 20 MG PO TABS
20.0000 mg | ORAL_TABLET | Freq: Every day | ORAL | 1 refills | Status: DC
  Filled 2023-09-07: qty 30, 30d supply, fill #0
  Filled 2023-09-07: qty 60, 60d supply, fill #0

## 2023-09-07 MED ORDER — TORSEMIDE 20 MG PO TABS
20.0000 mg | ORAL_TABLET | Freq: Every day | ORAL | Status: DC
  Administered 2023-09-07 – 2023-09-09 (×3): 20 mg via ORAL
  Filled 2023-09-07 (×3): qty 1

## 2023-09-07 MED ORDER — PANTOPRAZOLE SODIUM 40 MG PO TBEC: 40.0000 mg | DELAYED_RELEASE_TABLET | Freq: Every day | ORAL | Status: DC

## 2023-09-07 MED ORDER — MEDIHONEY WOUND/BURN DRESSING EX PSTE
1.0000 | PASTE | Freq: Every day | CUTANEOUS | Status: DC
  Administered 2023-09-07 – 2023-09-08 (×2): 1 via TOPICAL
  Filled 2023-09-07: qty 44

## 2023-09-07 NOTE — Progress Notes (Signed)
 PT Cancellation Note  Patient Details Name: David [name] MRN: [id number] DOB: Apr 11, 1946   Cancelled Treatment:    Reason Eval/Treat Not Completed: PT screened, no needs identified, will sign off; per [facility] notes pt is a SNF resident, plan is to return to same via PTAR.   Tamieka Rancourt 09/07/2023, 10:17 AM

## 2023-09-07 NOTE — NC FL2 (Signed)
 [place]  MEDICAID FL2 LEVEL OF CARE FORM     IDENTIFICATION  Patient Name: David [name] Birthdate: 02/16/1946 Sex: male Admission Date (Current Location): 09/04/2023  [facility] and [place] Number:  Producer, television/film/video and Address:  [facility],  [address] [place], [place] [address]      Provider Number: [id number] [id number]  Attending Physician Name and Address:  Ozell Blunt, MD  Relative Name and Phone Number:  [name] (spouse) Ph: [phone]    Current Level of Care: Hospital Recommended Level of Care: Skilled Nursing Facility Prior Approval Number:    Date Approved/Denied:   PASRR Number: [id number]  Discharge Plan: SNF    Current Diagnoses: Patient Active Problem List   Diagnosis Date Noted   Symptomatic anemia 09/04/2023   Chronic combined systolic and diastolic congestive heart failure (HCC) 09/04/2023   CHF (congestive heart failure) (HCC) 05/12/2023   Malnutrition of moderate degree 05/06/2023   Diabetic foot infection (HCC) 05/05/2023   Sacral wound 05/05/2023   Pleural effusion on right 05/05/2023   Elevated TSH 05/05/2023   Acute on chronic diastolic (congestive) heart failure (HCC) 05/05/2023   Diastolic CHF (HCC) 05/04/2023   Class 1 obesity 06/30/2022   Acute on chronic diastolic CHF (congestive heart failure) (HCC) 06/29/2022   PAD (peripheral artery disease) (HCC) 06/13/2022   Critical limb ischemia of left lower extremity (HCC) 03/21/2021   Nonhealing ulcer of left lower extremity limited to breakdown of skin (HCC) 03/21/2021   Abscess of left foot 08/03/2020   Osteomyelitis of fifth toe of left foot (HCC)    Mixed hyperlipidemia 10/15/2018   Bilateral leg edema 09/17/2018   Stage 3b chronic kidney disease (HCC) 09/10/2018   Anemia 09/10/2018   Pain and swelling of wrist, right 08/14/2018   Bilateral carotid artery stenosis 07/30/2018   Coronary artery disease involving native coronary artery of native heart without angina  pectoris 07/30/2018   Snoring 02/11/2018   Chronic heart failure with preserved ejection fraction (HFpEF) (HCC) 12/22/2017   At risk for adverse drug reaction 12/15/2017   Chronic pain syndrome 12/15/2017   Status post coronary artery stent placement    Multifocal pneumonia 03/30/2017   Type 2 diabetes mellitus with hyperlipidemia (HCC) 03/29/2017   Acute respiratory failure with hypoxemia (HCC) 03/29/2017   Acute on chronic respiratory failure with hypoxia (HCC)    Pulmonary congestion    Acquired contracture of Achilles tendon, right 08/19/2016   Amputated great toe, right (HCC) 07/18/2016   Onychomycosis 05/29/2016   Diabetic polyneuropathy associated with type 2 diabetes mellitus (HCC) 04/09/2016   Right foot ulcer, limited to breakdown of skin (HCC) 04/09/2016   Spinal stenosis of lumbar region without neurogenic claudication 05/24/2014   Hereditary and idiopathic peripheral neuropathy 09/15/2013   Malaise 08/14/2013   Essential hypertension 08/14/2013   Chronic back pain 08/14/2013   Glaucoma 08/14/2013   Headache 08/14/2013    Orientation RESPIRATION BLADDER Height & Weight     Self, Time, Situation, Place  Normal Incontinent Weight: 193 lb 2 oz (87.6 kg) Height:  6\' 2"  (188 cm)  BEHAVIORAL SYMPTOMS/MOOD NEUROLOGICAL BOWEL NUTRITION STATUS      Incontinent Diet (Heart healthy/carb modified diet)  AMBULATORY STATUS COMMUNICATION OF NEEDS Skin   Limited Assist Verbally Skin abrasions, Other (Comment) (Abrasions: buttocks; Erythema: groin, hip)                       Personal Care Assistance Level of Assistance  Bathing, Feeding, Dressing Bathing  Assistance: Limited assistance Feeding assistance: Independent Dressing Assistance: Limited assistance     Functional Limitations Info  Sight, Hearing, Speech Sight Info: Impaired Hearing Info: Adequate Speech Info: Adequate    SPECIAL CARE FACTORS FREQUENCY                       Contractures Contractures  Info: Not present    Additional Factors Info  Code Status, Allergies, Insulin  Sliding Scale Code Status Info: Full Allergies Info: NKA   Insulin  Sliding Scale Info: See discharge summary       Current Medications (09/07/2023):  This is the current hospital active medication list Current Facility-Administered Medications  Medication Dose Route Frequency Provider Last Rate Last Admin   0.9 %  sodium chloride  infusion (Manually program via Guardrails IV Fluids)   Intravenous Once [place], [place]   Held at 09/04/23 4098   acetaminophen  (TYLENOL ) tablet 650 mg  650 mg Oral Q6H PRN Danice Dural, MD       Or   acetaminophen  (TYLENOL ) suppository 650 mg  650 mg Rectal Q6H PRN Danice Dural, MD       albuterol  (PROVENTIL ) (2.5 MG/3ML) 0.083% nebulizer solution 3 mL  3 mL Inhalation Q6H PRN Danice Dural, MD       atorvastatin  (LIPITOR ) tablet 80 mg  80 mg Oral QHS Danice Dural, MD   80 mg at 09/06/23 2159   cholecalciferol  (VITAMIN D3) 25 MCG (1000 UNIT) tablet 5,000 Units  5,000 Units Oral Daily Danice Dural, MD   5,000 Units at 09/07/23 1191   docusate sodium  (COLACE) capsule 100 mg  100 mg Oral BID Danice Dural, MD   100 mg at 09/04/23 2133   feeding supplement (ENSURE ENLIVE / ENSURE PLUS) liquid 237 mL  237 mL Oral BID BM Danice Dural, MD       fentaNYL  (SUBLIMAZE ) injection 50 mcg  50 mcg Intravenous Q2H PRN Danice Dural, MD       gabapentin  (NEURONTIN ) capsule 200 mg  200 mg Oral TID Danice Dural, MD   200 mg at 09/07/23 4782   insulin  aspart (novoLOG ) injection 0-9 Units  0-9 Units Subcutaneous TID WC Danice Dural, MD   3 Units at 09/05/23 1728   [START ON 09/08/2023] insulin  glargine-yfgn (SEMGLEE ) injection 10 Units  10 Units Subcutaneous Daily Ozell Blunt, MD       isosorbide -hydrALAZINE  (BIDIL ) 20-37.5 MG per tablet 2 tablet  2 tablet Oral TID Danice Dural, MD   2 tablet at 09/07/23 0930   latanoprost   (XALATAN ) 0.005 % ophthalmic solution 1 drop  1 drop Both Eyes QHS Danice Dural, MD   1 drop at 09/06/23 2202   melatonin tablet 3 mg  3 mg Oral QHS Danice Dural, MD   3 mg at 09/06/23 2159   metoprolol  succinate (TOPROL -XL) 24 hr tablet 100 mg  100 mg Oral Daily Danice Dural, MD   100 mg at 09/07/23 9562   nitroGLYCERIN  (NITROSTAT ) SL tablet 0.4 mg  0.4 mg Sublingual Q5 min PRN Danice Dural, MD       ondansetron  (ZOFRAN ) tablet 4 mg  4 mg Oral Q6H PRN Danice Dural, MD       Or   ondansetron  (ZOFRAN ) injection 4 mg  4 mg Intravenous Q6H PRN Danice Dural, MD       polyethylene glycol (MIRALAX  / GLYCOLAX ) packet 17 g  17 g  Oral Daily Danice Dural, MD       pyridOXINE  (VITAMIN B6) tablet 100 mg  100 mg Oral Daily Danice Dural, MD   100 mg at 09/07/23 0930   traZODone  (DESYREL ) tablet 100 mg  100 mg Oral QHS Danice Dural, MD   100 mg at 09/06/23 2200     Discharge Medications: Please see discharge summary for a list of discharge medications.  Relevant Imaging Results:  Relevant Lab Results:   Additional Information SSN: [id number]  Zenon Hilda, LCSW

## 2023-09-07 NOTE — Progress Notes (Signed)
 [facility] NEPHROLOGY PROGRESS NOTE  Assessment/ Plan: Pt is a 78 y.o. yo male with history of CAD, DM, HTN presented with abdominal pain, consulting for AKI on CKD.  # AKI on CKD 3b: b/l creat is 1.8- 2.1 from late 2024 and jan 2025, eGFR 32- 37 ml/min. Creat here peaked at 3.6 in the setting of symptomatic anemia. UA negative. CT abd showed no obstruction. No hypotension.  Received IV fluid.  Patient is nonoliguric and creatinine level improved significantly.  No need for dialysis.  #Chronic diastolic CHF: has been in hospital 2-3 times for acute diast CHF in the last few yrs. Looks euvolemic here.  May need to resume half of the dose of torsemide  during discharge.  #HTN: on bidil , toprol , norvasc  at home. Cont here as needed.   #Chronic resp failure: on 3 L [place] O2 at home #Symptomatic anemia: sp prbc's per pmd #DM2: on insulin  #Debility: may be SNF dependent, might be bedbound.  Sign off, please call us  back with question.  Discussed with the primary team.  Subjective: Seen and examined.  Denies nausea, vomiting, chest pain, shortness of breath.  Urine output is recorded 950 cc.  No new event. Objective Vital signs in last 24 hours: Vitals:   09/06/23 1427 09/06/23 1954 09/07/23 0426 09/07/23 0500  BP: (!) 160/67 (!) 149/81 117/64   Pulse: 68 71 (!) 51   Resp: 18 14 18    Temp: 98.5 F (36.9 C) 98 F (36.7 C) 98.6 F (37 C)   TempSrc: Oral  Oral   SpO2: 100% 100% 100%   Weight:    87.6 kg  Height:       Weight change: -0.7 kg  Intake/Output Summary (Last 24 hours) at 09/07/2023 1014 Last data filed at 09/07/2023 0913 Gross per 24 hour  Intake 1439.38 ml  Output 950 ml  Net 489.38 ml       Labs: RENAL PANEL Recent Labs  Lab 09/04/23 0805 09/04/23 0855 09/05/23 0358 09/06/23 0404 09/07/23 0409  NA 138 139 138 137 140  K 4.6 4.6 4.3 4.1 4.1  CL 103 106 106 106 109  CO2 24  --  23 23 26   GLUCOSE 81 82 113* 142* 71  BUN 69* 64* 69* 69* 65*   CREATININE 2.71* 3.60* 3.25* 3.06* 2.61*  CALCIUM  8.4*  --  8.1* 8.3* 8.2*  PHOS  --   --   --   --  5.0*  ALBUMIN  3.0*  --  2.9* 2.9* 2.6*    Liver Function Tests: Recent Labs  Lab 09/04/23 0805 09/05/23 0358 09/06/23 0404 09/07/23 0409  AST 21 18 50*  --   ALT 28 21 36  --   ALKPHOS 128* 113 149*  --   BILITOT 0.7 0.7 0.6  --   PROT 7.8 7.0 7.3  --   ALBUMIN  3.0* 2.9* 2.9* 2.6*   Recent Labs  Lab 09/04/23 0805  LIPASE 23   No results for input(s): "AMMONIA" in the last 168 hours. CBC: Recent Labs    05/04/23 2305 05/04/23 2317 05/05/23 0732 05/06/23 0251 05/17/23 1449 05/18/23 0246 09/04/23 0855 09/04/23 1629 09/05/23 0358 09/05/23 1706 09/06/23 0404  HGB  --    < > 8.6*   < > 7.5*   < > 6.8* 8.3* 6.8* 9.0* 8.8*  MCV  --   --  110.5*   < > 108.6*   < >  --   --  113.6*  --  108.0*  VITAMINB12  --   --  1,002*  --  1,305*  --   --   --   --   --   --   FOLATE  --   --  32.9  --  21.4  --   --   --   --   --   --   FERRITIN 212  --   --   --  327  --   --   --   --   --   --   TIBC 203*  --   --   --  188*  --   --   --   --   --   --   IRON  39*  --   --   --  43*  --   --   --   --   --   --   RETICCTPCT 3.2*  --   --   --  3.4*  --   --   --   --   --   --    < > = values in this interval not displayed.    Cardiac Enzymes: No results for input(s): "CKTOTAL", "CKMB", "CKMBINDEX", "TROPONINI" in the last 168 hours. CBG: Recent Labs  Lab 09/06/23 1710 09/06/23 2026 09/07/23 0733 09/07/23 0759 09/07/23 0836  GLUCAP 93 104* 47* 66* 102*    Iron  Studies: No results for input(s): "IRON ", "TIBC", "TRANSFERRIN", "FERRITIN" in the last 72 hours. Studies/Results: No results found.  Medications: Infusions:   Scheduled Medications:  sodium chloride    Intravenous Once   atorvastatin   80 mg Oral QHS   cholecalciferol   5,000 Units Oral Daily   docusate sodium   100 mg Oral BID   feeding supplement  237 mL Oral BID BM   gabapentin   200 mg Oral TID    insulin  aspart  0-9 Units Subcutaneous TID WC   insulin  glargine-yfgn  15 Units Subcutaneous Daily   isosorbide -hydrALAZINE   2 tablet Oral TID   latanoprost   1 drop Both Eyes QHS   melatonin  3 mg Oral QHS   metoprolol  succinate  100 mg Oral Daily   polyethylene glycol  17 g Oral Daily   pyridoxine   100 mg Oral Daily   traZODone   100 mg Oral QHS    have reviewed scheduled and prn medications.  Physical Exam: General:NAD, comfortable Heart:RRR, s1s2 nl Lungs:clear b/l, no crackle Abdomen:soft, Non-tender, non-distended Extremities:No edema Neurology: Alert, awake and following commands.  David [name] David [name] 09/07/2023,10:14 AM  LOS: 1 day

## 2023-09-07 NOTE — TOC Transition Note (Signed)
 Transition of Care [facility]) - Discharge Note  Patient Details  Name: David [name] MRN: [id number] Date of Birth: 14-May-1946  Transition of [facility]) CM/SW Contact:  Zenon Hilda, LCSW Phone Number: 09/07/2023, 2:28 PM  Clinical Narrative: Patient is medically ready to return to [facility]. Discharge packet was completed and PTAR scheduled. Patient now requesting to appeal his discharge as he does not want to return to the facility. CSW met with patient to discuss HINN-12 and patient refused to sign after being notified of the cost of a continued stay. Patient provided copy of HINN-12. CSW assisted patient with calling to appeal discharge. TOC awaiting outcome of appeal.  Final next level of care: Skilled Nursing Facility Barriers to Discharge: Barriers Resolved  Patient Goals and CMS Choice Patient states their goals for this hospitalization and ongoing recovery are:: facility CMS Medicare.gov Compare Post Acute Care list provided to:: Patient Choice offered to / list presented to : NA [place] ownership interest in [facility].provided to:: Patient   Discharge Placement      Patient chooses bed at: Other - please specify in the comment section below: [facility] LTC) Patient to be transferred to facility by: PTAR Name of family member notified: [name] (spouse) Patient and family notified of of transfer: 09/07/23  Discharge Plan and Services Additional resources added to the After Visit Summary for   Discharge Planning Services: CM Consult         DME Arranged: N/A DME Agency: NA  Social Drivers of Health (SDOH) Interventions SDOH Screenings   Food Insecurity: No Food Insecurity (09/05/2023)  Housing: Low Risk  (09/05/2023)  Transportation Needs: No Transportation Needs (09/05/2023)  Utilities: Not At Risk (09/05/2023)  Alcohol Screen: Low Risk  (05/18/2023)  Financial Resource Strain: Low Risk  (05/18/2023)  Physical Activity: Inactive  (08/20/2017)  Social Connections: Socially Integrated (09/04/2023)  Stress: No Stress Concern Present (08/20/2017)  Tobacco Use: Medium Risk (09/04/2023)   Readmission Risk Interventions     No data to display

## 2023-09-07 NOTE — Consult Note (Addendum)
 WOC Nurse Consult Note: this patient is known to [facility] team from previous admission; at that time had a Deep Tissue Pressure Injury L heel that has now progressed to Unstageable Reason for Consult: L heel and buttocks wounds  Wound type: 1. Stage 3 Pressure Injury buttocks  2.  Unstageable Pressure Injury L heel  Pressure Injury POA: Yes (both present on last admission)  Measurement: see nursing flowsheet  Wound bed: 1.  Buttocks 50% pink dry 50% brown necrotic  2.  L heel 60% black necrotic 20% yellow necrotic 20% pink  Drainage (amount, consistency, odor) see nursing flowsheet  Periwound: dry peeling epithelium to buttocks, dark dry skin around heel wound  Dressing procedure/placement/frequency:  Cleanse L heel  wound with Vashe wound cleanser Timm Foot [phone]), do not rinse and allow to air dry.  Apply Medihoney to wound bed daily, cover with dry gauze and wrap in Kerlix roll gauze. Place L foot in Prevalon boot to offload pressure Timm Foot [phone].  Cleanse buttocks wounds with soap and water, dry and apply Medihoney to wound beds daily, cover with dry gauze and silicone foam or ABD pad and tape whichever is preferred.  POC discussed with bedside nurse and primary MD. X-rays and MRI negative for osteomyelitis 04/2023. Patient would benefit from ongoing management of this L heel wound from podiatry, ortho or vascular surgeon as outpatient. Patient has been followed by Dr. Julio Ohm for other wounds in the past.    WOC team will not follow. Re-consult if further needs arise.   Thank you,    Ronni Colace MSN, RN-BC, [employer] [phone]

## 2023-09-07 NOTE — Plan of Care (Signed)
  Problem: Clinical Measurements: Goal: Diagnostic test results will improve Outcome: Progressing   Problem: Clinical Measurements: Goal: Respiratory complications will improve Outcome: Progressing   Problem: Clinical Measurements: Goal: Cardiovascular complication will be avoided Outcome: Progressing   Problem: Clinical Measurements: Goal: Will remain free from infection Outcome: Progressing

## 2023-09-07 NOTE — Inpatient Diabetes Management (Signed)
 Inpatient Diabetes Program Recommendations  AACE/ADA: New Consensus Statement on Inpatient Glycemic Control   Target Ranges:  Prepandial:   less than 140 mg/dL      Peak postprandial:   less than 180 mg/dL (1-2 hours)      Critically ill patients:  140 - 180 mg/dL    Latest Reference Range & Units 09/06/23 07:35 09/06/23 12:14 09/06/23 17:10 09/06/23 20:26 09/07/23 07:33 09/07/23 07:59 09/07/23 08:36  Glucose-Capillary 70 - 99 mg/dL 161 (H) 096 (H) 93 045 (H) 47 (L) 66 (L) 102 (H)   Review of Glycemic Control  Diabetes history: DM2 Outpatient Diabetes medications: Semglee  15 units daily Current orders for Inpatient glycemic control: Semglee  15 units daily, Novolog  0-9 units TID  Inpatient Diabetes Program Recommendations:    Insulin :CBG 71 mg/dl and 66 mg/dl this morning. Please consider decreasing Semglee  to 10 units daily.  Thanks, Beacher Limerick, RN, MSN, CDCES Diabetes Coordinator Inpatient Diabetes Program [phone] (Team Pager from 8am to 5pm)

## 2023-09-07 NOTE — Discharge Summary (Addendum)
 Physician Discharge Summary   Patient: David [name] MRN: [id number] DOB: [birth year]  Admit date:     09/04/2023  Discharge date: 09/09/23  Discharge Physician: Ozell Blunt   PCP: Merl Star, MD   Recommendations at discharge:   Check CBC in 1 week Patient wants to go to a different skilled nursing facility, social work at skilled nursing facility can work with patient to find a new facility.  Discharge Diagnoses: Principal Problem:   Symptomatic anemia Active Problems:   Essential hypertension   Stage 3b chronic kidney disease (HCC)   Type 2 diabetes mellitus with hyperlipidemia (HCC)   Diabetic polyneuropathy associated with type 2 diabetes mellitus (HCC)   Coronary artery disease involving native coronary artery of native heart without angina pectoris   Chronic combined systolic and diastolic congestive heart failure (HCC)  Resolved Problems:   * No resolved hospital problems. *  Hospital Course: 78 y.o. male with medical history significant of osteoarthritis, CAD, status post stent placement, chronic systolic heart failure, stage IIIb CKD, type 2 diabetes, diabetic neuropathy, diabetic retinopathy, GERD, glaucoma, hypertension who presented to the emergency department with complaints of constipation.  He was also found to be anemic with a hemoglobin around 6.7 g/dL.  His last bowel movement was 4 days ago  CT abdomen/pelvis without contrast with no acute inflammatory process in the abdomen. Aortic atherosclerosis. Small right pleural effusion and atelectasis of the right lower lung.   Assessment and Plan:  Symptomatic anemia -Status post units PRBC, hemoglobin is 8.6 this morning -Status post 2 unit PRBC, hemoglobin 8.6 this morning - Likely from anemia of chronic disease - Check CBC in 1 week     Active Problems:   Chronic combined systolic and diastolic congestive heart failure (HCC)  Currently euvolemic. Continue metoprolol  and BiDil . -Will restart  torsemide  at low-dose of 20 mg daily     Essential hypertension Continue metoprolol  succinate 100 mg p.o. daily. Continue BiDil  3 times daily.     Coronary artery disease involving native  coronary artery of native heart without angina pectoris Continue beta-blocker, aspirin  and statin. Sublingual nitroglycerin  as needed.   Acute kidney injury on stage 3b chronic kidney disease [facility]) Nephrology consulted -Creatinine has improved to 2.61 with gentle IV hydration -Nephrology has signed off -Recommend to restart half dose of torsemide  on discharge -Patient takes 20 mg p.o. twice daily at home, will discharge on torsemide  20 mg p.o. daily     Type 2 diabetes mellitus with hyperlipidemia (HCC) Hemoglobin A1c 6.4    Diabetic polyneuropathy associated with type 2 diabetes mellitus (HCC) Continue gabapentin  200 mg p.o. 3 times daily.   Musculoskeletal neck pain - Improved after starting Robaxin , continue Robaxin  500 mg p.o. every 6 hours as needed      Consultants: Nephrology Procedures performed:  Disposition: Skilled nursing facility Diet recommendation:  Cardiac diet DISCHARGE MEDICATION: Allergies as of 09/09/2023   No Known Allergies      Medication List     STOP taking these medications    guaiFENesin  600 MG 12 hr tablet Commonly known as: MUCINEX    HYDROcodone -acetaminophen  5-325 MG tablet Commonly known as: NORCO/VICODIN       TAKE these medications    acetaminophen  500 MG tablet Commonly known as: TYLENOL  Take 1,000 mg by mouth in the morning and at bedtime.   albuterol  108 (90 Base) MCG/ACT inhaler Commonly known as: VENTOLIN  HFA Inhale 2 puffs into the lungs every 6 (six) hours as needed for wheezing or shortness  of breath.   amLODipine  10 MG tablet Commonly known as: NORVASC  Take 10 mg by mouth daily.   aspirin  EC 81 MG tablet Take 81 mg by mouth daily.   atorvastatin  80 MG tablet Commonly known as: LIPITOR  Take 80 mg by mouth at bedtime.    bisacodyl 10 MG suppository Commonly known as: DULCOLAX Place 10 mg rectally daily as needed for moderate constipation.   bismuth subsalicylate 262 MG/15ML suspension Commonly known as: PEPTO BISMOL Take 30 mLs by mouth daily as needed for diarrhea or loose stools.   cholecalciferol  25 MCG (1000 UNIT) tablet Commonly known as: VITAMIN D3 Take 5,000 Units by mouth daily.   cyanocobalamin 1000 MCG tablet Commonly known as: VITAMIN B12 Take 2,000 mcg by mouth daily.   dextromethorphan-guaiFENesin  30-600 MG 12hr tablet Commonly known as: MUCINEX  DM Take 1 tablet by mouth 2 (two) times daily.   dicyclomine  10 MG capsule Commonly known as: BENTYL  Take 10 mg by mouth in the morning, at noon, in the evening, and at bedtime.   docusate sodium  100 MG capsule Commonly known as: COLACE Take 100 mg by mouth 2 (two) times daily.   ferrous sulfate  325 (65 FE) MG tablet Take 325 mg by mouth daily with breakfast.   gabapentin  100 MG capsule Commonly known as: NEURONTIN  Take 200 mg by mouth 3 (three) times daily.   insulin  glargine-yfgn 100 UNIT/ML injection Commonly known as: SEMGLEE  Inject 15 Units into the skin daily.   isosorbide -hydrALAZINE  20-37.5 MG tablet Commonly known as: BIDIL  Take 2 tablets by mouth 3 (three) times daily.   latanoprost  0.005 % ophthalmic solution Commonly known as: XALATAN  Place 1 drop into both eyes at bedtime.   leptospermum manuka honey Pste paste Apply 1 Application topically daily. Apply to buttocks and left heel as directed   melatonin 3 MG Tabs tablet Take 3 mg by mouth at bedtime.   methocarbamol  500 MG tablet Commonly known as: ROBAXIN  Take 1 tablet (500 mg total) by mouth every 6 (six) hours as needed for muscle spasms.   metoprolol  succinate 100 MG 24 hr tablet Commonly known as: TOPROL -XL Take 100 mg by mouth daily.   multivitamin with minerals Tabs tablet Take 1 tablet by mouth daily.   nitroGLYCERIN  0.4 MG SL tablet Commonly  known as: NITROSTAT  Place 1 tablet (0.4 mg total) under the tongue every 5 (five) minutes as needed for chest pain.   ondansetron  4 MG tablet Commonly known as: ZOFRAN  Take 4 mg by mouth every 6 (six) hours as needed for nausea or vomiting.   OXYGEN Inhale 3 L into the lungs continuous.   pantoprazole  40 MG tablet Commonly known as: Protonix  Take 1 tablet (40 mg total) by mouth daily.   polyethylene glycol powder 17 GM/SCOOP powder Commonly known as: GLYCOLAX /MIRALAX  Take 1 capful (17 g) by mouth daily. What changed: additional instructions   pyridoxine  100 MG tablet Commonly known as: B-6 Take 100 mg by mouth daily.   torsemide  20 MG tablet Commonly known as: DEMADEX  Take 1 tablet (20 mg total) by mouth daily. What changed: when to take this   traZODone  100 MG tablet Commonly known as: DESYREL  Take 100 mg by mouth at bedtime.   UNABLE TO FIND Take 30 mLs by mouth in the morning and at bedtime. Med Name: protein liquid               Discharge Care Instructions  (From admission, onward)           Start  Ordered   09/09/23 0000  Discharge wound care:       Comments: Cleanse L heel  wound with Vashe wound cleanser Timm Foot [phone]), do not rinse and allow to air dry.  Apply Medihoney to wound bed daily, cover with dry gauze and wrap in Kerlix roll gauze. Place L foot in Prevalon boot to offload pressure Timm Foot [phone].  2.Cleanse buttocks wounds with soap and water, dry and apply Medihoney to wound beds daily, cover with dry gauze and silicone foam or ABD pad and tape whichever is preferred   09/09/23 9811             Contact information for after-discharge care     Destination     [facility] .   Service: Skilled Nursing Contact information: [address] [place]  [address] [phone]                    Discharge Exam: Filed Weights   09/07/23 0500 09/08/23 0407 09/09/23 0500  Weight: 87.6 kg 87.7 kg  87 kg   General-appears in no acute distress Heart-S1-S2, regular, no murmur auscultated Lungs-clear to auscultation bilaterally, no wheezing or crackles auscultated Abdomen-soft, nontender, no organomegaly Extremities-no edema in the lower extremities Neuro-alert, oriented x3, no focal deficit noted  Condition at discharge: good  The results of significant diagnostics from this hospitalization (including imaging, microbiology, ancillary and laboratory) are listed below for reference.   Imaging Studies: CT ABDOMEN PELVIS WO CONTRAST Result Date: 09/04/2023 CLINICAL DATA:  Abdominal pain, acute, nonlocalized. EXAM: CT ABDOMEN AND PELVIS WITHOUT CONTRAST TECHNIQUE: Multidetector CT imaging of the abdomen and pelvis was performed following the standard protocol without IV contrast. RADIATION DOSE REDUCTION: This exam was performed according to the departmental dose-optimization program which includes automated exposure control, adjustment of the mA and/or kV according to patient size and/or use of iterative reconstruction technique. COMPARISON:  CT scan abdomen and pelvis from 12/03/2017. FINDINGS: Lower chest: Moderate cardiomegaly. No pericardial effusion. There are coronary artery atherosclerotic calcifications, in keeping with coronary artery disease. There is apparent hypoattenuation of the blood pool relative to the myocardium, suggestive of anemia. There is small right pleural effusion with associated rounded atelectasis in the right lung lower lobe. There also atelectatic changes in the middle lobe and left lung lower lobe. No left pleural effusion. Hepatobiliary: The liver is normal in size. Non-cirrhotic configuration. No suspicious mass. No intrahepatic or extrahepatic bile duct dilation. Very small volume dependent calcified gallstones/sludge noted. No imaging evidence of acute cholecystitis. No abnormal wall thickening. No pericholecystic fat stranding. Pancreas: Small/atrophic pancreas. No  focal mass. No peripancreatic fat stranding. Spleen: Within normal limits. No focal lesion. Adrenals/Urinary Tract: Adrenal glands are unremarkable. No suspicious renal mass within the limitations of this unenhanced exam. No nephroureterolithiasis or obstructive uropathy. Unremarkable urinary bladder. Stomach/Bowel: No disproportionate dilation of the small or large bowel loops. No evidence of abnormal bowel wall thickening or inflammatory changes. The appendix is unremarkable. Vascular/Lymphatic: No ascites or pneumoperitoneum. There is mild residual fat stranding in the central lower abdomen, overall significantly decreased since the prior study from 12/03/2017. No abdominal or pelvic lymphadenopathy, by size criteria. No aneurysmal dilation of the major abdominal arteries. There are mild peripheral atherosclerotic vascular calcifications of the aorta and its major branches. Reproductive: Normal size prostate. Evaluation is markedly limited due to extensive streak artifacts from right hip hardware. Other: There is a tiny fat containing umbilical hernia. The soft tissues and abdominal wall are otherwise unremarkable.  Musculoskeletal: No suspicious osseous lesions. There are mild multilevel degenerative changes in the visualized spine. IMPRESSION: 1. No acute inflammatory process identified within the abdomen or pelvis. 2. Small right pleural effusion with associated rounded atelectasis in the right lung lower lobe. 3. Multiple other nonacute observations, as described above. 4. Aortic atherosclerosis. Aortic Atherosclerosis (ICD10-I70.0). Electronically Signed   By: Beula Brunswick M.D.   On: 09/04/2023 10:40    Microbiology: Results for orders placed or performed during the hospital encounter of 05/11/23  Resp panel by RT-PCR (RSV, Flu A&B, Covid) Anterior Nasal Swab     Status: None   Collection Time: 05/12/23 12:21 AM   Specimen: Anterior Nasal Swab  Result Value Ref Range Status   SARS Coronavirus 2 by  RT PCR NEGATIVE NEGATIVE Final   Influenza A by PCR NEGATIVE NEGATIVE Final   Influenza B by PCR NEGATIVE NEGATIVE Final    Comment: (NOTE) The Xpert Xpress SARS-CoV-2/FLU/RSV plus assay is intended as an aid in the diagnosis of influenza from Nasopharyngeal swab specimens and should not be used as a sole basis for treatment. Nasal washings and aspirates are unacceptable for Xpert Xpress SARS-CoV-2/FLU/RSV testing.  Fact Sheet for Patients: BloggerCourse.com  Fact Sheet for Healthcare Providers: SeriousBroker.it  This test is not yet approved or cleared by the [place]  FDA and has been authorized for detection and/or diagnosis of SARS-CoV-2 by FDA under an Emergency Use Authorization (EUA). This EUA will remain in effect (meaning this test can be used) for the duration of the COVID-19 declaration under Section 564(b)(1) of the Act, 21 U.S.C. section 360bbb-3(b)(1), unless the authorization is terminated or revoked.     Resp Syncytial Virus by PCR NEGATIVE NEGATIVE Final    Comment: (NOTE) Fact Sheet for Patients: BloggerCourse.com  Fact Sheet for Healthcare Providers: SeriousBroker.it  This test is not yet approved or cleared by the [place]  FDA and has been authorized for detection and/or diagnosis of SARS-CoV-2 by FDA under an Emergency Use Authorization (EUA). This EUA will remain in effect (meaning this test can be used) for the duration of the COVID-19 declaration under Section 564(b)(1) of the Act, 21 U.S.C. section 360bbb-3(b)(1), unless the authorization is terminated or revoked.  Performed at [facility], [address] [place], [place] [address]   Blood Culture (routine x 2)     Status: None   Collection Time: 05/12/23 12:34 AM   Specimen: BLOOD RIGHT ARM  Result Value Ref Range Status   Specimen Description BLOOD RIGHT ARM  Final   Special  Requests   Final    BOTTLES DRAWN AEROBIC AND ANAEROBIC Blood Culture adequate volume   Culture   Final    NO GROWTH 5 DAYS Performed at [facility], [address] [place], [place] [address]    Report Status 05/17/2023 FINAL  Final  Blood Culture (routine x 2)     Status: None   Collection Time: 05/12/23 12:45 AM   Specimen: BLOOD RIGHT HAND  Result Value Ref Range Status   Specimen Description BLOOD RIGHT HAND  Final   Special Requests   Final    BOTTLES DRAWN AEROBIC AND ANAEROBIC Blood Culture adequate volume   Culture   Final    NO GROWTH 5 DAYS Performed at [facility], [address] [place], [place] [address]    Report Status 05/17/2023 FINAL  Final  MRSA Next Gen by PCR, Nasal     Status: None   Collection Time: 05/12/23  3:45 AM   Specimen: Nasal Mucosa; Nasal Swab  Result Value Ref Range Status   MRSA by PCR Next Gen NOT DETECTED NOT DETECTED Final    Comment: (NOTE) The GeneXpert MRSA Assay (FDA approved for NASAL specimens only), is one component of a comprehensive MRSA colonization surveillance program. It is not intended to diagnose MRSA infection nor to guide or monitor treatment for MRSA infections. Test performance is not FDA approved in patients less than 76 years old. Performed at [facility], [address] [place], [place] [address]   Culture, Respiratory w Gram Stain     Status: None   Collection Time: 05/12/23  9:10 AM   Specimen: Tracheal Aspirate; Respiratory  Result Value Ref Range Status   Specimen Description TRACHEAL ASPIRATE  Final   Special Requests NONE  Final   Gram Stain   Final    NO WBC SEEN FEW GRAM POSITIVE COCCI IN CLUSTERS RARE GRAM POSITIVE RODS Performed at [facility], [address] [place], [place] [address]    Culture ABUNDANT STAPHYLOCOCCUS EPIDERMIDIS  Final   Report Status 05/16/2023 FINAL  Final   Organism ID, Bacteria STAPHYLOCOCCUS EPIDERMIDIS  Final      Susceptibility    Staphylococcus epidermidis - MIC*    CIPROFLOXACIN <=0.5 SENSITIVE Sensitive     ERYTHROMYCIN <=0.25 SENSITIVE Sensitive     GENTAMICIN <=0.5 SENSITIVE Sensitive     OXACILLIN >=4 RESISTANT Resistant     TETRACYCLINE >=16 RESISTANT Resistant     VANCOMYCIN  1 SENSITIVE Sensitive     TRIMETH /SULFA  <=10 SENSITIVE Sensitive     CLINDAMYCIN <=0.25 SENSITIVE Sensitive     RIFAMPIN <=0.5 SENSITIVE Sensitive     Inducible Clindamycin NEGATIVE Sensitive     * ABUNDANT STAPHYLOCOCCUS EPIDERMIDIS  Body fluid culture w Gram Stain     Status: None   Collection Time: 05/12/23 10:49 AM   Specimen: Pleural Fluid  Result Value Ref Range Status   Specimen Description PLEURAL  Final   Special Requests NONE  Final   Gram Stain NO WBC SEEN NO ORGANISMS SEEN   Final   Culture   Final    NO GROWTH 3 DAYS Performed at [facility], [address] [place], [place] [address]    Report Status 05/15/2023 FINAL  Final    Labs: CBC: Recent Labs  Lab 09/04/23 0805 09/04/23 0855 09/04/23 1629 09/05/23 0358 09/05/23 1706 09/06/23 0404 09/07/23 1038  WBC 8.6  --   --  7.4  --  7.0 7.4  NEUTROABS 6.8  --   --   --   --   --   --   HGB 6.7*   < > 8.3* 6.8* 9.0* 8.8* 8.6*  HCT 22.6*   < > 27.0* 22.6* 28.8* 28.2* 27.9*  MCV 118.3*  --   --  113.6*  --  108.0* 109.0*  PLT 255  --   --  237  --  241 216   < > = values in this interval not displayed.   Basic Metabolic Panel: Recent Labs  Lab 09/04/23 0805 09/04/23 0855 09/05/23 0358 09/06/23 0404 09/07/23 0409 09/08/23 0356  NA 138 139 138 137 140 136  K 4.6 4.6 4.3 4.1 4.1 4.3  CL 103 106 106 106 109 105  CO2 24  --  23 23 26 22   GLUCOSE 81 82 113* 142* 71 81  BUN 69* 64* 69* 69* 65* 62*  CREATININE 2.71* 3.60* 3.25* 3.06* 2.61*  2.49*  CALCIUM  8.4*  --  8.1* 8.3* 8.2* 8.1*  PHOS  --   --   --   --  5.0* 5.3*   Liver Function Tests: Recent Labs  Lab 09/04/23 0805 09/05/23 0358 09/06/23 0404 09/07/23 0409 09/08/23 0356  AST 21  18 50*  --   --   ALT 28 21 36  --   --   ALKPHOS 128* 113 149*  --   --   BILITOT 0.7 0.7 0.6  --   --   PROT 7.8 7.0 7.3  --   --   ALBUMIN  3.0* 2.9* 2.9* 2.6* 2.8*   CBG: Recent Labs  Lab 09/07/23 2047 09/08/23 0734 09/08/23 1158 09/08/23 1655 09/08/23 2118  GLUCAP 71 72 94 134* 105*    Discharge time spent: greater than 30 minutes.  Signed: Ozell Blunt, MD [facility] 09/09/2023

## 2023-09-07 NOTE — Progress Notes (Addendum)
 [facility]  PROGRESS NOTE  David [name] BJY:[id number] DOB: 07/30/1945 DOA: 09/04/2023 PCP: Merl Star, MD   Brief HPI:   78 y.o. male with medical history significant of osteoarthritis, CAD, status post stent placement, chronic systolic heart failure, stage IIIb CKD, type 2 diabetes, diabetic neuropathy, diabetic retinopathy, GERD, glaucoma, hypertension who presented to the emergency department with complaints of constipation.  He was also found to be anemic with a hemoglobin around 6.7 g/dL.  His last bowel movement was 4 days ago  CT abdomen/pelvis without contrast with no acute inflammatory process in the abdomen. Aortic atherosclerosis. Small right pleural effusion and atelectasis of the right lower lung.    Assessment/Plan:   Symptomatic anemia -Status post units PRBC, hemoglobin is 8.6 this morning -Status post 2 unit PRBC, hemoglobin 8.8 this morning - Likely from anemia of chronic disease    Active Problems:   Chronic combined systolic and diastolic congestive heart failure (HCC)  Currently euvolemic. Continue metoprolol  and BiDil .     Essential hypertension Continue metoprolol  succinate 100 mg p.o. daily. Continue BiDil  3 times daily.     Coronary artery disease involving native  coronary artery of native heart without angina pectoris Continue beta-blocker, aspirin  and statin. Sublingual nitroglycerin  as needed.   Acute kidney injury on stage 3b chronic kidney disease [facility]) Nephrology consulted -Creatinine has improved to 2.61 with gentle IV hydration -Nephrology has signed off -Recommend to restart half dose of torsemide  on discharge -Patient takes 20 mg p.o. twice daily at home, will discharge on torsemide  20 mg p.o. daily     Type 2 diabetes mellitus with hyperlipidemia (HCC) Carbohydrate modified diet. - Was hypoglycemic this morning, we will cut down the dose of Semglee  to 10 units subcu daily CBG monitoring with RI SS. Check hemoglobin  A1c. On atorvastatin .     Diabetic polyneuropathy associated with type 2 diabetes mellitus (HCC) Continue gabapentin  200 mg p.o. 3 times daily.    Medications     sodium chloride    Intravenous Once   atorvastatin   80 mg Oral QHS   cholecalciferol   5,000 Units Oral Daily   docusate sodium   100 mg Oral BID   feeding supplement  237 mL Oral BID BM   gabapentin   200 mg Oral TID   insulin  aspart  0-9 Units Subcutaneous TID WC   insulin  glargine-yfgn  15 Units Subcutaneous Daily   isosorbide -hydrALAZINE   2 tablet Oral TID   latanoprost   1 drop Both Eyes QHS   melatonin  3 mg Oral QHS   metoprolol  succinate  100 mg Oral Daily   polyethylene glycol  17 g Oral Daily   pyridoxine   100 mg Oral Daily   traZODone   100 mg Oral QHS     Data Reviewed:   CBG:  Recent Labs  Lab 09/06/23 1710 09/06/23 2026 09/07/23 0733 09/07/23 0759 09/07/23 0836  GLUCAP 93 104* 47* 66* 102*    SpO2: 100 % O2 Flow Rate (L/min): 3 L/min    Vitals:   09/06/23 1427 09/06/23 1954 09/07/23 0426 09/07/23 0500  BP: (!) 160/67 (!) 149/81 117/64   Pulse: 68 71 (!) 51   Resp: 18 14 18    Temp: 98.5 F (36.9 C) 98 F (36.7 C) 98.6 F (37 C)   TempSrc: Oral  Oral   SpO2: 100% 100% 100%   Weight:    87.6 kg  Height:          Data Reviewed:  Basic Metabolic Panel: Recent Labs  Lab  09/04/23 0805 09/04/23 0855 09/05/23 0358 09/06/23 0404 09/07/23 0409  NA 138 139 138 137 140  K 4.6 4.6 4.3 4.1 4.1  CL 103 106 106 106 109  CO2 24  --  23 23 26   GLUCOSE 81 82 113* 142* 71  BUN 69* 64* 69* 69* 65*  CREATININE 2.71* 3.60* 3.25* 3.06* 2.61*  CALCIUM  8.4*  --  8.1* 8.3* 8.2*  PHOS  --   --   --   --  5.0*    CBC: Recent Labs  Lab 09/04/23 0805 09/04/23 0855 09/04/23 1629 09/05/23 0358 09/05/23 1706 09/06/23 0404  WBC 8.6  --   --  7.4  --  7.0  NEUTROABS 6.8  --   --   --   --   --   HGB 6.7* 6.8* 8.3* 6.8* 9.0* 8.8*  HCT 22.6* 20.0* 27.0* 22.6* 28.8* 28.2*  MCV 118.3*  --   --   113.6*  --  108.0*  PLT 255  --   --  237  --  241    LFT Recent Labs  Lab 09/04/23 0805 09/05/23 0358 09/06/23 0404 09/07/23 0409  AST 21 18 50*  --   ALT 28 21 36  --   ALKPHOS 128* 113 149*  --   BILITOT 0.7 0.7 0.6  --   PROT 7.8 7.0 7.3  --   ALBUMIN  3.0* 2.9* 2.9* 2.6*     Antibiotics: Anti-infectives (From admission, onward)    None        DVT prophylaxis: SCDs  Code Status: Full code  Family Communication: No family at bedside   CONSULTS    Subjective   Denies any complaints   Objective    Physical Examination:  General-appears in no acute distress Heart-S1-S2, regular, no murmur auscultated Lungs-clear to auscultation bilaterally, no wheezing or crackles auscultated Abdomen-soft, nontender, no organomegaly Extremities-no edema in the lower extremities Neuro-alert, oriented x3, no focal deficit noted  Status is: Inpatient:      Pressure Injury 05/12/23 Coccyx Mid Stage 2 -  Partial thickness loss of dermis presenting as a shallow open injury with a red, pink wound bed without slough. (Active)  05/12/23 0400  Location: Coccyx  Location Orientation: Mid  Staging: Stage 2 -  Partial thickness loss of dermis presenting as a shallow open injury with a red, pink wound bed without slough.  Wound Description (Comments):   Present on Admission: Yes     Pressure Injury 05/12/23 Heel Left Deep Tissue Pressure Injury - Purple or maroon localized area of discolored intact skin or blood-filled blister due to damage of underlying soft tissue from pressure and/or shear. (Active)  05/12/23 0400  Location: Heel  Location Orientation: Left  Staging: Deep Tissue Pressure Injury - Purple or maroon localized area of discolored intact skin or blood-filled blister due to damage of underlying soft tissue from pressure and/or shear.  Wound Description (Comments):   Present on Admission: Yes        Nicco Reaume S Zakery Normington   [facility] If 7PM-7AM, please  contact night-coverage at [email or web address], Office  [phone]   09/07/2023, 8:40 AM  LOS: 1 day

## 2023-09-08 ENCOUNTER — Other Ambulatory Visit (HOSPITAL_COMMUNITY): Payer: Self-pay

## 2023-09-08 DIAGNOSIS — E1169 Type 2 diabetes mellitus with other specified complication: Secondary | ICD-10-CM | POA: Diagnosis not present

## 2023-09-08 DIAGNOSIS — D649 Anemia, unspecified: Secondary | ICD-10-CM | POA: Diagnosis not present

## 2023-09-08 DIAGNOSIS — I10 Essential (primary) hypertension: Secondary | ICD-10-CM | POA: Diagnosis not present

## 2023-09-08 DIAGNOSIS — N1832 Chronic kidney disease, stage 3b: Secondary | ICD-10-CM | POA: Diagnosis not present

## 2023-09-08 LAB — GLUCOSE, CAPILLARY
Glucose-Capillary: 72 mg/dL (ref 70–99)
Glucose-Capillary: 94 mg/dL (ref 70–99)
Glucose-Capillary: 134 mg/dL — ABNORMAL HIGH (ref 70–99)
Glucose-Capillary: 105 mg/dL — ABNORMAL HIGH (ref 70–99)

## 2023-09-08 LAB — RENAL FUNCTION PANEL
Albumin: 2.8 g/dL — ABNORMAL LOW (ref 3.5–5.0)
Anion gap: 9 (ref 5–15)
BUN: 62 mg/dL — ABNORMAL HIGH (ref 8–23)
CO2: 22 mmol/L (ref 22–32)
Calcium: 8.1 mg/dL — ABNORMAL LOW (ref 8.9–10.3)
Chloride: 105 mmol/L (ref 98–111)
Creatinine, Ser: 2.49 mg/dL — ABNORMAL HIGH (ref 0.61–1.24)
GFR, Estimated: 26 mL/min — ABNORMAL LOW (ref >60)
Glucose, Bld: 81 mg/dL (ref 70–99)
Phosphorus: 5.3 mg/dL — ABNORMAL HIGH (ref 2.5–4.6)
Potassium: 4.3 mmol/L (ref 3.5–5.1)
Sodium: 136 mmol/L (ref 135–145)

## 2023-09-08 MED ORDER — METHOCARBAMOL 500 MG PO TABS
500.0000 mg | ORAL_TABLET | Freq: Four times a day (QID) | ORAL | Status: DC
  Administered 2023-09-08 – 2023-09-09 (×4): 500 mg via ORAL
  Filled 2023-09-08 (×4): qty 1

## 2023-09-08 NOTE — Progress Notes (Signed)
 [facility]  PROGRESS NOTE  David [name] GEX:[id number] DOB: [birth year] DOA: 09/04/2023 PCP: Merl Star, MD   Brief HPI:   78 y.o. male with medical history significant of osteoarthritis, CAD, status post stent placement, chronic systolic heart failure, stage IIIb CKD, type 2 diabetes, diabetic neuropathy, diabetic retinopathy, GERD, glaucoma, hypertension who presented to the emergency department with complaints of constipation.  He was also found to be anemic with a hemoglobin around 6.7 g/dL.  His last bowel movement was 4 days ago  CT abdomen/pelvis without contrast with no acute inflammatory process in the abdomen. Aortic atherosclerosis. Small right pleural effusion and atelectasis of the right lower lung.    Assessment/Plan:   Symptomatic anemia -Status post units PRBC, hemoglobin is 8.6 this morning -Status post 2 unit PRBC, hemoglobin 8.8 this morning - Likely from anemia of chronic disease  Neck pain/?  Musculoskeletal -Will start Robaxin  500 mg p.o. every 6 hours   Active Problems:   Chronic combined systolic and diastolic congestive heart failure (HCC)  Currently euvolemic. Continue metoprolol  and BiDil .     Essential hypertension Continue metoprolol  succinate 100 mg p.o. daily. Continue BiDil  3 times daily.     Coronary artery disease involving native  coronary artery of native heart without angina pectoris Continue beta-blocker, aspirin  and statin. Sublingual nitroglycerin  as needed.   Acute kidney injury on stage 3b chronic kidney disease [name]) Nephrology consulted -Creatinine has improved to 2.49 -Nephrology has signed off -Recommend to restart half dose of torsemide  on discharge -Patient takes 20 mg p.o. twice daily at home, will discharge on torsemide  20 mg p.o. daily -Started on torsemide  20 mg daily in the hospital     Type 2 diabetes mellitus with hyperlipidemia (HCC) Carbohydrate modified diet. - Was hypoglycemic this morning, we will  cut down the dose of Semglee  to 10 units subcu daily CBG monitoring with RI SS. On atorvastatin .     Diabetic polyneuropathy associated with type 2 diabetes mellitus (HCC) Continue gabapentin  200 mg p.o. 3 times daily.    Medications     sodium chloride    Intravenous Once   atorvastatin   80 mg Oral QHS   cholecalciferol   5,000 Units Oral Daily   docusate sodium   100 mg Oral BID   feeding supplement  237 mL Oral BID BM   gabapentin   200 mg Oral TID   insulin  aspart  0-9 Units Subcutaneous TID WC   insulin  glargine-yfgn  10 Units Subcutaneous Daily   isosorbide -hydrALAZINE   2 tablet Oral TID   latanoprost   1 drop Both Eyes QHS   leptospermum manuka honey  1 Application Topical Daily   melatonin  3 mg Oral QHS   metoprolol  succinate  100 mg Oral Daily   polyethylene glycol  17 g Oral Daily   pyridoxine   100 mg Oral Daily   torsemide   20 mg Oral Daily   traZODone   100 mg Oral QHS     Data Reviewed:   CBG:  Recent Labs  Lab 09/07/23 1145 09/07/23 1436 09/07/23 1635 09/07/23 2047 09/08/23 0734  GLUCAP 126* 128* 160* 71 72    SpO2: 99 % O2 Flow Rate (L/min): 3 L/min    Vitals:   09/07/23 0500 09/07/23 1146 09/07/23 1944 09/08/23 0407  BP:  (!) 155/75 127/61 (!) 145/70  Pulse:  61 (!) 51 (!) 52  Resp:  18 18 19   Temp:  98.5 F (36.9 C) 98.4 F (36.9 C) 98.4 F (36.9 C)  TempSrc:  Oral Oral Oral  SpO2:  99% 100% 99%  Weight: 87.6 kg   87.7 kg  Height:          Data Reviewed:  Basic Metabolic Panel: Recent Labs  Lab 09/04/23 0805 09/04/23 0855 09/05/23 0358 09/06/23 0404 09/07/23 0409 09/08/23 0356  NA 138 139 138 137 140 136  K 4.6 4.6 4.3 4.1 4.1 4.3  CL 103 106 106 106 109 105  CO2 24  --  23 23 26 22   GLUCOSE 81 82 113* 142* 71 81  BUN 69* 64* 69* 69* 65* 62*  CREATININE 2.71* 3.60* 3.25* 3.06* 2.61* 2.49*  CALCIUM  8.4*  --  8.1* 8.3* 8.2* 8.1*  PHOS  --   --   --   --  5.0* 5.3*    CBC: Recent Labs  Lab 09/04/23 0805 09/04/23 0855  09/04/23 1629 09/05/23 0358 09/05/23 1706 09/06/23 0404 09/07/23 1038  WBC 8.6  --   --  7.4  --  7.0 7.4  NEUTROABS 6.8  --   --   --   --   --   --   HGB 6.7*   < > 8.3* 6.8* 9.0* 8.8* 8.6*  HCT 22.6*   < > 27.0* 22.6* 28.8* 28.2* 27.9*  MCV 118.3*  --   --  113.6*  --  108.0* 109.0*  PLT 255  --   --  237  --  241 216   < > = values in this interval not displayed.    LFT Recent Labs  Lab 09/04/23 0805 09/05/23 0358 09/06/23 0404 09/07/23 0409 09/08/23 0356  AST 21 18 50*  --   --   ALT 28 21 36  --   --   ALKPHOS 128* 113 149*  --   --   BILITOT 0.7 0.7 0.6  --   --   PROT 7.8 7.0 7.3  --   --   ALBUMIN  3.0* 2.9* 2.9* 2.6* 2.8*     Antibiotics: Anti-infectives (From admission, onward)    None        DVT prophylaxis: SCDs  Code Status: Full code  Family Communication: No family at bedside   CONSULTS    Subjective   Continues to complain of neck pain.  Discharge was held yesterday as patient has appealed against discharge.   Objective    Physical Examination:  General-appears in no acute distress Heart-S1-S2, regular, no murmur auscultated Lungs-clear to auscultation bilaterally, no wheezing or crackles auscultated Abdomen-soft, nontender, no organomegaly Extremities-no edema in the lower extremities Musculoskeletal-tenderness in right neck muscles Neuro-alert, oriented x3, no focal deficit noted  Status is: Inpatient:      Pressure Injury 05/12/23 Coccyx Mid Stage 2 -  Partial thickness loss of dermis presenting as a shallow open injury with a red, pink wound bed without slough. (Active)  05/12/23 0400  Location: Coccyx  Location Orientation: Mid  Staging: Stage 2 -  Partial thickness loss of dermis presenting as a shallow open injury with a red, pink wound bed without slough.  Wound Description (Comments):   Present on Admission: Yes     Pressure Injury 05/12/23 Heel Left Deep Tissue Pressure Injury - Purple or maroon localized area  of discolored intact skin or blood-filled blister due to damage of underlying soft tissue from pressure and/or shear. (Active)  05/12/23 0400  Location: Heel  Location Orientation: Left  Staging: Deep Tissue Pressure Injury - Purple or maroon localized area of discolored intact skin or blood-filled blister due to damage of  underlying soft tissue from pressure and/or shear.  Wound Description (Comments):   Present on Admission: Yes        Adalynne Steffensmeier S Burt Piatek   [facility] If 7PM-7AM, please contact night-coverage at [email or web address], Office  [phone]   09/08/2023, 8:00 AM  LOS: 2 days

## 2023-09-08 NOTE — Plan of Care (Signed)
  Problem: Clinical Measurements: Goal: Respiratory complications will improve Outcome: Progressing   Problem: Clinical Measurements: Goal: Cardiovascular complication will be avoided Outcome: Progressing   Problem: Activity: Goal: Risk for activity intolerance will decrease Outcome: Progressing   Problem: Nutrition: Goal: Adequate nutrition will be maintained Outcome: Progressing   Problem: Elimination: Goal: Will not experience complications related to bowel motility Outcome: Progressing

## 2023-09-08 NOTE — TOC Progression Note (Signed)
 Transition of Care [facility]) - Progression Note   Patient Details  Name: David [name] MRN: [id number] Date of Birth: May 06, 1946  [facility]) CM/SW Contact  Zenon Hilda, LCSW Phone Number: 09/08/2023, 3:43 PM  Clinical Narrative: Patient lost the appeal, so patient and spouse were notified. Patient and spouse aware that patient will be financially responsible starting tomorrow. Patient does not want to return to [facility], but spouse reported she cannot care for him at home. Patient and spouse to discuss if patient will go home or return to LTC.  Expected Discharge Plan: Skilled Nursing Facility Barriers to Discharge: Barriers Resolved  Expected Discharge Plan and Services Discharge Planning Services: CM Consult Living arrangements for the past 2 months: Skilled Nursing Facility Expected Discharge Date: 09/07/23               DME Arranged: N/A DME Agency: NA  Social Determinants of Health (SDOH) Interventions SDOH Screenings   Food Insecurity: No Food Insecurity (09/05/2023)  Housing: Low Risk  (09/05/2023)  Transportation Needs: No Transportation Needs (09/05/2023)  Utilities: Not At Risk (09/05/2023)  Alcohol Screen: Low Risk  (05/18/2023)  Financial Resource Strain: Low Risk  (05/18/2023)  Physical Activity: Inactive (08/20/2017)  Social Connections: Socially Integrated (09/04/2023)  Stress: No Stress Concern Present (08/20/2017)  Tobacco Use: Medium Risk (09/04/2023)   Readmission Risk Interventions     No data to display

## 2023-09-09 ENCOUNTER — Other Ambulatory Visit (HOSPITAL_COMMUNITY): Payer: Self-pay

## 2023-09-09 MED ORDER — METHOCARBAMOL 500 MG PO TABS: 500.0000 mg | ORAL_TABLET | Freq: Four times a day (QID) | ORAL | Status: DC | PRN

## 2023-09-09 MED ORDER — MEDIHONEY WOUND/BURN DRESSING EX PSTE: 1.0000 | PASTE | Freq: Every day | CUTANEOUS | Status: DC

## 2023-09-09 NOTE — TOC Transition Note (Signed)
 Transition of Care [facility]) - Discharge Note  Patient Details  Name: David [name] MRN: [id number] Date of Birth: [birth year]  Transition of Care [facility]) CM/SW Contact:  Zenon Hilda, LCSW Phone Number: 09/09/2023, 9:29 AM  Clinical Narrative: CSW provided patient with faxed copy of appeal denial. Patient and spouse confirmed patient will need to return to [facility] LTC as he cannot discharge home. CSW notified Aundra Lee in admissions at [facility]. Medical necessity form done; PTAR scheduled. Discharge packet completed. RN updated. TOC signing off.  Final next level of care: Long Term Nursing Home Barriers to Discharge: Barriers Resolved  Patient Goals and CMS Choice Patient states their goals for this hospitalization and ongoing recovery are:: facility CMS Medicare.gov Compare Post Acute Care list provided to:: Patient Choice offered to / list presented to : NA [place] ownership interest in [facility].provided to:: Patient   Discharge Placement        Patient chooses bed at: Other - please specify in the comment section below: [facility]) Patient to be transferred to facility by: PTAR Name of family member notified: [name] (spouse) Patient and family notified of of transfer: 09/09/23  Discharge Plan and Services Additional resources added to the After Visit Summary for   Discharge Planning Services: CM Consult         DME Arranged: N/A DME Agency: NA  Social Drivers of Health (SDOH) Interventions SDOH Screenings   Food Insecurity: No Food Insecurity (09/05/2023)  Housing: Low Risk  (09/05/2023)  Transportation Needs: No Transportation Needs (09/05/2023)  Utilities: Not At Risk (09/05/2023)  Alcohol Screen: Low Risk  (05/18/2023)  Financial Resource Strain: Low Risk  (05/18/2023)  Physical Activity: Inactive (08/20/2017)  Social Connections: Socially Integrated (09/04/2023)  Stress: No Stress Concern Present (08/20/2017)  Tobacco Use: Medium Risk  (09/04/2023)   Readmission Risk Interventions     No data to display

## 2023-09-09 NOTE — Progress Notes (Signed)
 Discharge summary has been updated.  Patient will be discharged to skilled nursing facility today.

## 2023-10-29 ENCOUNTER — Emergency Department (HOSPITAL_COMMUNITY)

## 2023-10-29 ENCOUNTER — Inpatient Hospital Stay (HOSPITAL_COMMUNITY)
Admission: EM | Admit: 2023-10-29 | Discharge: 2023-11-26 | DRG: 291 | Disposition: A | Discharge status: Skilled Nursing Facility | Source: Skilled Nursing Facility | Attending: Hospitalist | Admitting: Hospitalist

## 2023-10-29 DIAGNOSIS — I5043 Acute on chronic combined systolic (congestive) and diastolic (congestive) heart failure: Secondary | ICD-10-CM | POA: Diagnosis present

## 2023-10-29 DIAGNOSIS — F419 Anxiety disorder, unspecified: Secondary | ICD-10-CM | POA: Diagnosis present

## 2023-10-29 DIAGNOSIS — Z8249 Family history of ischemic heart disease and other diseases of the circulatory system: Secondary | ICD-10-CM

## 2023-10-29 DIAGNOSIS — E1169 Type 2 diabetes mellitus with other specified complication: Secondary | ICD-10-CM | POA: Diagnosis present

## 2023-10-29 DIAGNOSIS — Z8349 Family history of other endocrine, nutritional and metabolic diseases: Secondary | ICD-10-CM

## 2023-10-29 DIAGNOSIS — Z7982 Long term (current) use of aspirin: Secondary | ICD-10-CM

## 2023-10-29 DIAGNOSIS — Z833 Family history of diabetes mellitus: Secondary | ICD-10-CM

## 2023-10-29 DIAGNOSIS — J9601 Acute respiratory failure with hypoxia: Secondary | ICD-10-CM | POA: Diagnosis present

## 2023-10-29 DIAGNOSIS — Z955 Presence of coronary angioplasty implant and graft: Secondary | ICD-10-CM

## 2023-10-29 DIAGNOSIS — Z823 Family history of stroke: Secondary | ICD-10-CM

## 2023-10-29 DIAGNOSIS — I2489 Other forms of acute ischemic heart disease: Secondary | ICD-10-CM | POA: Diagnosis present

## 2023-10-29 DIAGNOSIS — E11628 Type 2 diabetes mellitus with other skin complications: Secondary | ICD-10-CM | POA: Diagnosis present

## 2023-10-29 DIAGNOSIS — D500 Iron deficiency anemia secondary to blood loss (chronic): Secondary | ICD-10-CM | POA: Diagnosis present

## 2023-10-29 DIAGNOSIS — N184 Chronic kidney disease, stage 4 (severe): Secondary | ICD-10-CM | POA: Diagnosis present

## 2023-10-29 DIAGNOSIS — E785 Hyperlipidemia, unspecified: Secondary | ICD-10-CM | POA: Diagnosis present

## 2023-10-29 DIAGNOSIS — I739 Peripheral vascular disease, unspecified: Secondary | ICD-10-CM

## 2023-10-29 DIAGNOSIS — E11319 Type 2 diabetes mellitus with unspecified diabetic retinopathy without macular edema: Secondary | ICD-10-CM | POA: Diagnosis present

## 2023-10-29 DIAGNOSIS — I10 Essential (primary) hypertension: Secondary | ICD-10-CM | POA: Diagnosis present

## 2023-10-29 DIAGNOSIS — R29810 Facial weakness: Secondary | ICD-10-CM | POA: Diagnosis present

## 2023-10-29 DIAGNOSIS — M5030 Other cervical disc degeneration, unspecified cervical region: Secondary | ICD-10-CM | POA: Diagnosis present

## 2023-10-29 DIAGNOSIS — K2100 Gastro-esophageal reflux disease with esophagitis, without bleeding: Secondary | ICD-10-CM | POA: Diagnosis present

## 2023-10-29 DIAGNOSIS — K3189 Other diseases of stomach and duodenum: Secondary | ICD-10-CM | POA: Diagnosis present

## 2023-10-29 DIAGNOSIS — E1122 Type 2 diabetes mellitus with diabetic chronic kidney disease: Secondary | ICD-10-CM | POA: Diagnosis present

## 2023-10-29 DIAGNOSIS — M1990 Unspecified osteoarthritis, unspecified site: Secondary | ICD-10-CM | POA: Diagnosis present

## 2023-10-29 DIAGNOSIS — I2510 Atherosclerotic heart disease of native coronary artery without angina pectoris: Secondary | ICD-10-CM | POA: Diagnosis present

## 2023-10-29 DIAGNOSIS — N179 Acute kidney failure, unspecified: Secondary | ICD-10-CM | POA: Diagnosis present

## 2023-10-29 DIAGNOSIS — I272 Pulmonary hypertension, unspecified: Secondary | ICD-10-CM | POA: Diagnosis present

## 2023-10-29 DIAGNOSIS — E119 Type 2 diabetes mellitus without complications: Secondary | ICD-10-CM | POA: Diagnosis present

## 2023-10-29 DIAGNOSIS — Z1152 Encounter for screening for COVID-19: Secondary | ICD-10-CM

## 2023-10-29 DIAGNOSIS — L089 Local infection of the skin and subcutaneous tissue, unspecified: Secondary | ICD-10-CM | POA: Diagnosis present

## 2023-10-29 DIAGNOSIS — R7989 Other specified abnormal findings of blood chemistry: Secondary | ICD-10-CM | POA: Diagnosis present

## 2023-10-29 DIAGNOSIS — R0602 Shortness of breath: Secondary | ICD-10-CM | POA: Diagnosis not present

## 2023-10-29 DIAGNOSIS — H409 Unspecified glaucoma: Secondary | ICD-10-CM | POA: Diagnosis present

## 2023-10-29 DIAGNOSIS — L89610 Pressure ulcer of right heel, unstageable: Secondary | ICD-10-CM | POA: Diagnosis present

## 2023-10-29 DIAGNOSIS — I509 Heart failure, unspecified: Principal | ICD-10-CM

## 2023-10-29 DIAGNOSIS — K2950 Unspecified chronic gastritis without bleeding: Secondary | ICD-10-CM | POA: Diagnosis present

## 2023-10-29 DIAGNOSIS — H02401 Unspecified ptosis of right eyelid: Secondary | ICD-10-CM | POA: Diagnosis present

## 2023-10-29 DIAGNOSIS — E11621 Type 2 diabetes mellitus with foot ulcer: Secondary | ICD-10-CM | POA: Diagnosis present

## 2023-10-29 DIAGNOSIS — Z5971 Insufficient health insurance coverage: Secondary | ICD-10-CM

## 2023-10-29 DIAGNOSIS — Z87891 Personal history of nicotine dependence: Secondary | ICD-10-CM

## 2023-10-29 DIAGNOSIS — Z751 Person awaiting admission to adequate facility elsewhere: Secondary | ICD-10-CM

## 2023-10-29 DIAGNOSIS — K259 Gastric ulcer, unspecified as acute or chronic, without hemorrhage or perforation: Secondary | ICD-10-CM | POA: Diagnosis present

## 2023-10-29 DIAGNOSIS — I130 Hypertensive heart and chronic kidney disease with heart failure and stage 1 through stage 4 chronic kidney disease, or unspecified chronic kidney disease: Principal | ICD-10-CM | POA: Diagnosis present

## 2023-10-29 DIAGNOSIS — Z794 Long term (current) use of insulin: Secondary | ICD-10-CM

## 2023-10-29 DIAGNOSIS — E663 Overweight: Secondary | ICD-10-CM | POA: Diagnosis present

## 2023-10-29 DIAGNOSIS — H5461 Unqualified visual loss, right eye, normal vision left eye: Secondary | ICD-10-CM | POA: Diagnosis present

## 2023-10-29 DIAGNOSIS — L89620 Pressure ulcer of left heel, unstageable: Secondary | ICD-10-CM | POA: Diagnosis present

## 2023-10-29 DIAGNOSIS — Z96641 Presence of right artificial hip joint: Secondary | ICD-10-CM | POA: Diagnosis present

## 2023-10-29 DIAGNOSIS — Z993 Dependence on wheelchair: Secondary | ICD-10-CM

## 2023-10-29 DIAGNOSIS — Z9981 Dependence on supplemental oxygen: Secondary | ICD-10-CM

## 2023-10-29 DIAGNOSIS — Z820 Family history of epilepsy and other diseases of the nervous system: Secondary | ICD-10-CM

## 2023-10-29 DIAGNOSIS — J159 Unspecified bacterial pneumonia: Secondary | ICD-10-CM | POA: Diagnosis present

## 2023-10-29 DIAGNOSIS — D631 Anemia in chronic kidney disease: Secondary | ICD-10-CM | POA: Diagnosis present

## 2023-10-29 DIAGNOSIS — J189 Pneumonia, unspecified organism: Secondary | ICD-10-CM | POA: Diagnosis not present

## 2023-10-29 DIAGNOSIS — E1140 Type 2 diabetes mellitus with diabetic neuropathy, unspecified: Secondary | ICD-10-CM | POA: Diagnosis present

## 2023-10-29 DIAGNOSIS — Z6825 Body mass index (BMI) 25.0-25.9, adult: Secondary | ICD-10-CM

## 2023-10-29 DIAGNOSIS — Z79899 Other long term (current) drug therapy: Secondary | ICD-10-CM

## 2023-10-29 LAB — CBC WITH DIFFERENTIAL/PLATELET
Abs Immature Granulocytes: 0.04 10*3/uL (ref 0.00–0.07)
Basophils Absolute: 0 10*3/uL (ref 0.0–0.1)
Basophils Relative: 0 %
Eosinophils Absolute: 0.1 10*3/uL (ref 0.0–0.5)
Eosinophils Relative: 1 %
HCT: 26.1 % — ABNORMAL LOW (ref 39.0–52.0)
Hemoglobin: 8.1 g/dL — ABNORMAL LOW (ref 13.0–17.0)
Immature Granulocytes: 1 %
Lymphocytes Relative: 11 %
Lymphs Abs: 1 10*3/uL (ref 0.7–4.0)
MCH: 34 pg (ref 26.0–34.0)
MCHC: 31 g/dL (ref 30.0–36.0)
MCV: 109.7 fL — ABNORMAL HIGH (ref 80.0–100.0)
Monocytes Absolute: 0.8 10*3/uL (ref 0.1–1.0)
Monocytes Relative: 9 %
Neutro Abs: 7 10*3/uL (ref 1.7–7.7)
Neutrophils Relative %: 78 %
Platelets: 237 10*3/uL (ref 150–400)
RBC: 2.38 MIL/uL — ABNORMAL LOW (ref 4.22–5.81)
RDW: 18 % — ABNORMAL HIGH (ref 11.5–15.5)
WBC: 8.8 10*3/uL (ref 4.0–10.5)
nRBC: 0 % (ref 0.0–0.2)

## 2023-10-29 LAB — BASIC METABOLIC PANEL WITH GFR
Anion gap: 11 (ref 5–15)
BUN: 32 mg/dL — ABNORMAL HIGH (ref 8–23)
CO2: 23 mmol/L (ref 22–32)
Calcium: 8 mg/dL — ABNORMAL LOW (ref 8.9–10.3)
Chloride: 107 mmol/L (ref 98–111)
Creatinine, Ser: 1.97 mg/dL — ABNORMAL HIGH (ref 0.61–1.24)
GFR, Estimated: 35 mL/min — ABNORMAL LOW (ref >60)
Glucose, Bld: 141 mg/dL — ABNORMAL HIGH (ref 70–99)
Potassium: 3.6 mmol/L (ref 3.5–5.1)
Sodium: 141 mmol/L (ref 135–145)

## 2023-10-29 LAB — BRAIN NATRIURETIC PEPTIDE: B Natriuretic Peptide: 650.9 pg/mL — ABNORMAL HIGH (ref 0.0–100.0)

## 2023-10-29 MED ORDER — IOHEXOL 350 MG/ML SOLN
60.0000 mL | Freq: Once | INTRAVENOUS | Status: AC | PRN
  Administered 2023-10-29: 60 mL via INTRAVENOUS

## 2023-10-29 MED ORDER — FUROSEMIDE 10 MG/ML IJ SOLN
60.0000 mg | Freq: Once | INTRAMUSCULAR | Status: AC
  Administered 2023-10-29: 60 mg via INTRAVENOUS
  Filled 2023-10-29: qty 8

## 2023-10-29 MED ORDER — SODIUM CHLORIDE 0.9 % IV SOLN
500.0000 mg | Freq: Once | INTRAVENOUS | Status: AC
  Administered 2023-10-29: 500 mg via INTRAVENOUS
  Filled 2023-10-29: qty 5

## 2023-10-29 MED ORDER — SODIUM CHLORIDE 0.9 % IV SOLN
1.0000 g | Freq: Once | INTRAVENOUS | Status: AC
  Administered 2023-10-29: 1 g via INTRAVENOUS
  Filled 2023-10-29: qty 10

## 2023-10-29 NOTE — ED Triage Notes (Signed)
 Patient BIB EMS from home complaining of SOB x 2 days, gotten worse. Hx of CHF, and states he feels like it is CHF exac. Last happened in March 2025 for same thing. On arrival, EMS states patient was not on his oxygen but EMS put on NRB at 10 liters at [place] 100%.   CBG 155 76  180/76 100% NRB 20g Left FA

## 2023-10-29 NOTE — ED Provider Notes (Signed)
 [place] AT [facility] Provider Note   CSN: [id number] Arrival date & time: 10/29/23  1627     Patient presents with: No chief complaint on file.   David [name] is a 78 y.o. male with a history of coronary disease, heart failure, stage III kidney disease, type 2 diabetes, diabetic neuropathy and retinopathy, blindness in right eye, glaucoma, hypertension, presented to ED with concern for generalized weakness and shortness of breath.  Patient reports feeling progressively weak over the past few days.  Denies fevers or productive coughing.  He says he has been essentially bedbound for the past few months with worsening deconditioning and weakness.  The patient reports that he wears 2 L of oxygen at home  I reviewed external records and he was most recently hospitalized 2 months ago, at that time found to be constipated with anemia and a hemoglobin of 6.7.  CT showed no emergent process.  He received 2 units of blood while in the hospital.  This was felt to be anemia due to chronic disease per my review of the discharge summary.  He reports he is eating but has a poor appetite, drinking little.  He reports he has not moved his bowel in 2 or 3 days which is not unusual.  His last echocardiogram on file is from December 2024 with an EF of 40 to 45%.   HPI     Prior to Admission medications   Medication Sig Start Date End Date Taking? Authorizing Provider  acetaminophen  (TYLENOL ) 500 MG tablet Take 1,000 mg by mouth in the morning and at bedtime.    [provider]  albuterol  (PROVENTIL  HFA;VENTOLIN  HFA) 108 (90 Base) MCG/ACT inhaler Inhale 2 puffs into the lungs every 6 (six) hours as needed for wheezing or shortness of breath. 04/09/17   Justina Oman, MD  amLODipine  (NORVASC ) 10 MG tablet Take 10 mg by mouth daily. 05/29/23   [provider]  aspirin  EC 81 MG tablet Take 81 mg by mouth daily.    [provider]   atorvastatin  (LIPITOR ) 80 MG tablet Take 80 mg by mouth at bedtime.    [provider]  bisacodyl (DULCOLAX) 10 MG suppository Place 10 mg rectally daily as needed for moderate constipation.    [provider]  bismuth subsalicylate (PEPTO BISMOL) 262 MG/15ML suspension Take 30 mLs by mouth daily as needed for diarrhea or loose stools.    [provider]  cholecalciferol  (VITAMIN D3) 25 MCG (1000 UNIT) tablet Take 5,000 Units by mouth daily.    [provider]  cyanocobalamin (VITAMIN B12) 1000 MCG tablet Take 2,000 mcg by mouth daily.    [provider]  dextromethorphan-guaiFENesin  (MUCINEX  DM) 30-600 MG 12hr tablet Take 1 tablet by mouth 2 (two) times daily.    [provider]  dicyclomine  (BENTYL ) 10 MG capsule Take 10 mg by mouth in the morning, at noon, in the evening, and at bedtime.    [provider]  docusate sodium  (COLACE) 100 MG capsule Take 100 mg by mouth 2 (two) times daily.    [provider]  ferrous sulfate  325 (65 FE) MG tablet Take 325 mg by mouth daily with breakfast.    [provider]  gabapentin  (NEURONTIN ) 100 MG capsule Take 200 mg by mouth 3 (three) times daily.    [provider]  insulin  glargine-yfgn (SEMGLEE ) 100 UNIT/ML injection Inject 15 Units into the skin daily.    [provider]  isosorbide -hydrALAZINE  (BIDIL ) 20-37.5 MG tablet Take 2 tablets by mouth 3 (three) times daily. 05/25/23   Deforest Fast, MD  latanoprost  (XALATAN ) 0.005 % ophthalmic solution Place 1 drop into both eyes at bedtime. 04/10/23   [provider]  leptospermum manuka honey (MEDIHONEY) PSTE paste Apply 1 Application topically daily. Apply to buttocks and left heel as directed 09/09/23   Ozell Blunt, MD  melatonin 3 MG TABS tablet Take 3 mg by mouth at bedtime.    [provider]  methocarbamol  (ROBAXIN ) 500 MG tablet Take 1 tablet (500 mg total) by mouth every 6 (six) hours  as needed for muscle spasms. 09/09/23   Ozell Blunt, MD  metoprolol  succinate (TOPROL -XL) 100 MG 24 hr tablet Take 100 mg by mouth daily. 05/29/23   [provider]  Multiple Vitamin (MULTIVITAMIN WITH MINERALS) TABS tablet Take 1 tablet by mouth daily. 08/16/18   Sheikh, Omair Latif, DO  nitroGLYCERIN  (NITROSTAT ) 0.4 MG SL tablet Place 1 tablet (0.4 mg total) under the tongue every 5 (five) minutes as needed for chest pain. 05/02/22   Patwardhan, Kaye Parsons, MD  ondansetron  (ZOFRAN ) 4 MG tablet Take 4 mg by mouth every 6 (six) hours as needed for nausea or vomiting.    [provider]  OXYGEN Inhale 3 L into the lungs continuous.    [provider]  pantoprazole  (PROTONIX ) 40 MG tablet Take 1 tablet (40 mg total) by mouth daily. 09/07/23 09/06/24  Ozell Blunt, MD  polyethylene glycol powder (GLYCOLAX /MIRALAX ) 17 GM/SCOOP powder Take 1 capful (17 g) by mouth daily. Patient taking differently: Take 17 g by mouth daily. PRN order: 17 g by mouth daily as needed for constipation 05/25/23   Deforest Fast, MD  pyridoxine  (B-6) 100 MG tablet Take 100 mg by mouth daily.    [provider]  torsemide  (DEMADEX ) 20 MG tablet Take 1 tablet (20 mg total) by mouth daily. 09/07/23   Ozell Blunt, MD  traZODone  (DESYREL ) 100 MG tablet Take 100 mg by mouth at bedtime. 05/29/23   [provider]  UNABLE TO FIND Take 30 mLs by mouth in the morning and at bedtime. Med Name: protein liquid    [provider]    Allergies: Patient has no known allergies.    Review of Systems  Updated Vital Signs BP (!) 162/91   Pulse 69   Temp 98.6 F (37 C)   Resp 14   SpO2 100%   Physical Exam Constitutional:      General: He is not in acute distress. HENT:     Head: Normocephalic and atraumatic.   Eyes:     Conjunctiva/sclera: Conjunctivae normal.     Pupils: Pupils are equal, round, and reactive to light.    Cardiovascular:     Rate and Rhythm: Normal rate and  regular rhythm.  Pulmonary:     Effort: Pulmonary effort is normal. No respiratory distress.     Comments: Distant breath sounds bilaterally, no audible wheezing, patient is wearing 2L [place] home oxygen here with 92% O2 saturation Abdominal:     General: There is no distension.     Tenderness: There is no abdominal tenderness.   Skin:    General: Skin is warm and dry.     Comments: Pressure ulcerations of the bilateral heels, appropriately wound dressed   Neurological:     General: No focal deficit present.     Mental Status: He is alert. Mental status is at baseline.  Psychiatric:        Mood and Affect: Mood normal.        Behavior: Behavior normal.     (all labs ordered are listed, but only abnormal results are displayed) Labs Reviewed  BASIC METABOLIC PANEL WITH GFR - Abnormal; Notable for the following components:      Result Value   Glucose, Bld 141 (*)    BUN 32 (*)    Creatinine, Ser 1.97 (*)    Calcium  8.0 (*)    GFR, Estimated 35 (*)    All other components within normal limits  CBC WITH DIFFERENTIAL/PLATELET - Abnormal; Notable for the following components:   RBC 2.38 (*)    Hemoglobin 8.1 (*)    HCT 26.1 (*)    MCV 109.7 (*)    RDW 18.0 (*)    All other components within normal limits  BRAIN NATRIURETIC PEPTIDE - Abnormal; Notable for the following components:   B Natriuretic Peptide 650.9 (*)    All other components within normal limits    EKG: EKG Interpretation Date/Time:  Thursday October 29 2023 17:06:03 EDT Ventricular Rate:  70 PR Interval:  235 QRS Duration:  86 QT Interval:  484 QTC Calculation: 523 R Axis:   56  Text Interpretation: Sinus rhythm Prolonged PR interval Nonspecific repol abnormality, diffuse leads Prolonged QT interval Confirmed by Jerald Molly [phone]) on 10/29/2023 5:25:51 PM  Radiology: CT Angio Chest PE W and/or Wo Contrast Result Date: 10/29/2023 CLINICAL DATA:  Shortness of breath for 2 days getting worse. History of CHF.  EXAM: CT ANGIOGRAPHY CHEST WITH CONTRAST TECHNIQUE: Multidetector CT imaging of the chest was performed using the standard protocol during bolus administration of intravenous contrast. Multiplanar CT image reconstructions and MIPs were obtained to evaluate the vascular anatomy. RADIATION DOSE REDUCTION: This exam was performed according to the departmental dose-optimization program which includes automated exposure control, adjustment of the mA and/or kV according to patient size and/or use of iterative reconstruction technique. CONTRAST:  60mL OMNIPAQUE  IOHEXOL  350 MG/ML SOLN COMPARISON:  Same day chest radiograph and CT chest 05/05/2023 FINDINGS: Cardiovascular: Negative for acute pulmonary embolism. Dilated main pulmonary artery measuring 34 mm in diameter. Dilated ascending aorta measuring 40 mm in diameter. Cardiomegaly. Trace pericardial effusion. Coronary artery and aortic atherosclerotic calcification. Mediastinum/Nodes: Shotty mediastinal and hilar lymph nodes are favored reactive. Trachea and esophagus are unremarkable. Lungs/Pleura: Marked interlobular septal thickening throughout both lungs. Peribronchovascular ground-glass and consolidative opacities greatest in the right lower lobe. Moderate right small left pleural effusions. No pneumothorax. Upper Abdomen: Question gastric wall thickening versus underdistention. Otherwise no acute abnormality. Musculoskeletal: No acute fracture. Review of the MIP images confirms the above findings. IMPRESSION: 1. Negative for acute pulmonary embolism. 2. CHF with pulmonary edema. Moderate right and small left pleural effusions. Superimposed pneumonia in the right lower lobe is not excluded. 3. Dilated main pulmonary artery, which can be seen in the setting of pulmonary hypertension. 4. Question gastritis versus underdistention. 5. Aortic Atherosclerosis (ICD10-I70.0). Electronically Signed   By: Rozell Cornet M.D.   On: 10/29/2023 21:20   DG Chest Portable 1  View Result Date: 10/29/2023 CLINICAL DATA:  Shortness of breath EXAM: PORTABLE CHEST 1 VIEW COMPARISON:  05/19/2023 FINDINGS: Heart is mildly enlarged. Mild vascular congestion. Small right pleural effusion again noted with peripheral airspace opacities throughout the right lung, similar prior study. Diffuse interstitial opacities concerning for edema. IMPRESSION: Cardiomegaly with vascular congestion and probable interstitial edema. Small right pleural effusion with chronic peripheral densities in  the right mid and lower lung, favor scarring. Electronically Signed   By: Janeece Mechanic M.D.   On: 10/29/2023 18:35     .Critical Care  Performed by: Arvilla Birmingham, MD Authorized by: Arvilla Birmingham, MD   Critical care provider statement:    Critical care time (minutes):  30   Critical care time was exclusive of:  Separately billable procedures and treating other patients   Critical care was necessary to treat or prevent imminent or life-threatening deterioration of the following conditions:  Respiratory failure   Critical care was time spent personally by me on the following activities:  Ordering and performing treatments and interventions, ordering and review of laboratory studies, ordering and review of radiographic studies, pulse oximetry, review of old charts, examination of patient and evaluation of patient's response to treatment Comments:     IV diuresis for congestive heart failure exacerbation with pulmonary edema, hypoxia    Medications Ordered in the ED  iohexol  (OMNIPAQUE ) 350 MG/ML injection 60 mL (60 mLs Intravenous Contrast Given 10/29/23 2035)  furosemide  (LASIX ) injection 60 mg (60 mg Intravenous Given 10/29/23 2213)  cefTRIAXone  (ROCEPHIN ) 1 g in sodium chloride  0.9 % 100 mL IVPB (0 g Intravenous Stopped 10/29/23 2214)  azithromycin  (ZITHROMAX ) 500 mg in sodium chloride  0.9 % 250 mL IVPB (500 mg Intravenous New Bag/Given 10/29/23 2218)    Clinical Course as of 10/29/23 2345   Thu Oct 29, 2023  2327 Admitted to hospitalist [MT]    Clinical Course User Index [MT] Arvilla Birmingham, MD                                 Medical Decision Making Amount and/or Complexity of Data Reviewed Labs: ordered. Radiology: ordered.  Risk Prescription drug management. Decision regarding hospitalization.   This patient presents to the ED with concern for shortness of breath, general fatigue, feeling bad. This involves an extensive number of treatment options, and is a complaint that carries with it a high risk of complications and morbidity.  The differential diagnosis includes anemia versus congestive heart failure versus pneumonia versus other  Co-morbidities that complicate the patient evaluation: History of heart failure, anemia, risk of recurrence  Additional history obtained from EMS  External records from outside source obtained and reviewed including hospital discharge summary from April  I ordered and personally interpreted labs.  The pertinent results include: Elevated BNP  I ordered imaging studies including x-ray of the chest, ct pe I independently visualized and interpreted imaging which showed bilateral pleural effusions, potential infiltrate right-sided I agree with the radiologist interpretation  The patient was maintained on a cardiac monitor.  I personally viewed and interpreted the cardiac monitored which showed an underlying rhythm of: Sinus rhythm  Per my interpretation the patient's ECG shows no acute ischemic findings  I ordered medication including antibiotics for potential community pneumonia.  IV Lasix  diuresis for pulmonary edema  I have reviewed the patients home medicines and have made adjustments as needed  Test Considered: Doubt acute meningitis, or intra-abdominal infection.   After the interventions noted above, I reevaluated the patient and found that they have: stayed the same -patient remained stable on 2 L nasal cannula, not  requiring BiPAP or intubation at this time   Dispostion:  After consideration of the diagnostic results and the patient's response to treatment, I feel that the patent would benefit from medical admission for pneumonia, congestive heart failure  and pulmonary edema.      Final diagnoses:  Congestive heart failure, unspecified HF chronicity, unspecified heart failure type (HCC)  Pneumonia due to infectious organism, unspecified laterality, unspecified part of lung    ED Discharge Orders     None          Arvilla Birmingham, MD 10/29/23 2345

## 2023-10-29 NOTE — H&P (Signed)
 History and Physical    David [name] NGE:[id number] DOB: 11-25-1945 DOA: 10/29/2023  PCP: Merl Star, MD  Patient coming from: home  I have personally briefly reviewed patient's old medical records in [facility]  Chief Complaint: sob x 2days  HPI: David [name] is a 78 y.o. male with medical history significant of  CAD s/p stent, Combined systolic diastolic heart failure, CKDIII, DmII, GERD, HTN  who presents to ED BIBEMS  with complaint of sob x 2 days.  ON arrival EMS placed patient on NRB and transported to ED.  Patient of note ha interim history of admission 4/18-4/23 for diagnosis of symptomatic anemia for which he was treated with 2 units of PRBC. Of note at that time anemia was deemed related to anemia of chronic disease. Currently patient states he feels improved s/p treatment in ED. He notes no fever/chills/ n/v/d/ or abdominal pain or chest pain.   ED Course:   Patient on evaluation found to have CHF exacerbation as well as possible pneumonia RLL. Patient is admitted for further care .  Vitals: 98.8, BP 154/79, hr 70, rr 21 sat 92%  on 2L  EKG: nsr, QTC523 Wbc 8.8, hgb 8.1 at baseline, plt237 Na 141, K 3.6, CL 107, Glu141, cr 1.97 BNP 650 ( 202) RVP pending  cxr FINDINGS: Heart is mildly enlarged. Mild vascular congestion. Small right pleural effusion again noted with peripheral airspace opacities throughout the right lung, similar prior study. Diffuse interstitial opacities concerning for edema.   IMPRESSION: Cardiomegaly with vascular congestion and probable interstitial edema.   Small right pleural effusion with chronic peripheral densities in the right mid and lower lung, favor scarring.   CTPE IMPRESSION: 1. Negative for acute pulmonary embolism. 2. CHF with pulmonary edema. Moderate right and small left pleural effusions. Superimposed pneumonia in the right lower lobe is not excluded. 3. Dilated main pulmonary artery, which can be seen in  the setting of pulmonary hypertension. 4. Question gastritis versus underdistention. 5. Aortic Atherosclerosis (ICD10-I70.0). Review of Systems: As per HPI otherwise 10 point review of systems negative.   Past Medical History:  Diagnosis Date   Anemia    Arthritis    CAD in native artery    s/p stent in 11/18   CHF (congestive heart failure) (HCC)    normal echo in 11/18   CKD (chronic kidney disease) stage 3, GFR 30-59 ml/min (HCC)    DDD (degenerative disc disease), cervical    Diabetes mellitus with complication (HCC)    Type II   Diabetic neuropathy (HCC)    Diabetic retinopathy (HCC)    GERD (gastroesophageal reflux disease)    Glaucoma    Hypertension     Past Surgical History:  Procedure Laterality Date   AMPUTATION Right 07/09/2016   Procedure: Right Great Toe Amputation at Metatarsophalangeal Joint;  Surgeon: Timothy Ford, MD;  Location: [facility] OR;  Service: Orthopedics;  Laterality: Right;   AMPUTATION Right 05/28/2018   Procedure: RIGHT SECOND TOE AMPUTATION;  Surgeon: Timothy Ford, MD;  Location: [facility] OR;  Service: Orthopedics;  Laterality: Right;   AMPUTATION Left 08/03/2020   Procedure: LEFT 5TH RAY AMPUTATION;  Surgeon: Timothy Ford, MD;  Location: [facility] OR;  Service: Orthopedics;  Laterality: Left;   BACK SURGERY     4   CARDIAC CATHETERIZATION     CORONARY STENT INTERVENTION N/A 04/06/2017   Procedure: CORONARY STENT INTERVENTION;  Surgeon: Cody Das, MD;  Location: [facility] INVASIVE CV LAB;  Service:  Cardiovascular;  Laterality: N/A;   CORONARY STENT INTERVENTION N/A 04/07/2017   Procedure: CORONARY STENT INTERVENTION;  Surgeon: Cody Das, MD;  Location: [facility] INVASIVE CV LAB;  Service: Cardiovascular;  Laterality: N/A;   CYST EXCISION     on Back   ENDOTRACHEAL INTUBATION EMERGENT  08/07/2018       JOINT REPLACEMENT     LAPAROSCOPIC ABDOMINAL EXPLORATION N/A 11/26/2017   Procedure: LAPAROSCOPIC ABDOMINAL EXPLORATION, DRAINAGE OF APPENDICEAL ABCESS.  PLACEMENT OF DRAIN;  Surgeon: Juanita Norlander, MD;  Location: [facility] OR;  Service: General;  Laterality: N/A;   RIGHT/LEFT HEART CATH AND CORONARY ANGIOGRAPHY N/A 04/06/2017   Procedure: RIGHT/LEFT HEART CATH AND CORONARY ANGIOGRAPHY;  Surgeon: Cody Das, MD;  Location: [facility] INVASIVE CV LAB;  Service: Cardiovascular;  Laterality: N/A;   TOE SURGERY  05/2018   Left    TOTAL HIP ARTHROPLASTY     Right      reports that he quit smoking about 29 years ago. His smoking use included cigarettes. He started smoking about 34 years ago. He has a 1.3 pack-year smoking history. He has been exposed to tobacco smoke. He has never used smokeless tobacco. He reports that he does not currently use alcohol. He reports that he does not use drugs.  No Known Allergies  Family History  Problem Relation Age of Onset   Diabetes Other    Hyperlipidemia Other    Hypertension Other    Stroke Other    Alzheimer's disease Other    Thyroid  disease Mother    Diabetes Mellitus II Father    Alzheimer's disease Father     Prior to Admission medications   Medication Sig Start Date End Date Taking? Authorizing Provider  acetaminophen  (TYLENOL ) 500 MG tablet Take 1,000 mg by mouth in the morning and at bedtime.    [provider]  albuterol  (PROVENTIL  HFA;VENTOLIN  HFA) 108 (90 Base) MCG/ACT inhaler Inhale 2 puffs into the lungs every 6 (six) hours as needed for wheezing or shortness of breath. 04/09/17   Justina Oman, MD  amLODipine  (NORVASC ) 10 MG tablet Take 10 mg by mouth daily. 05/29/23   [provider]  aspirin  EC 81 MG tablet Take 81 mg by mouth daily.    [provider]  atorvastatin  (LIPITOR ) 80 MG tablet Take 80 mg by mouth at bedtime.    [provider]  bisacodyl (DULCOLAX) 10 MG suppository Place 10 mg rectally daily as needed for moderate constipation.    [provider]  bismuth subsalicylate (PEPTO BISMOL) 262 MG/15ML suspension Take 30 mLs by mouth daily  as needed for diarrhea or loose stools.    [provider]  cholecalciferol  (VITAMIN D3) 25 MCG (1000 UNIT) tablet Take 5,000 Units by mouth daily.    [provider]  cyanocobalamin (VITAMIN B12) 1000 MCG tablet Take 2,000 mcg by mouth daily.    [provider]  dextromethorphan-guaiFENesin  (MUCINEX  DM) 30-600 MG 12hr tablet Take 1 tablet by mouth 2 (two) times daily.    [provider]  dicyclomine  (BENTYL ) 10 MG capsule Take 10 mg by mouth in the morning, at noon, in the evening, and at bedtime.    [provider]  docusate sodium  (COLACE) 100 MG capsule Take 100 mg by mouth 2 (two) times daily.    [provider]  ferrous sulfate  325 (65 FE) MG tablet Take 325 mg by mouth daily with breakfast.    [provider]  gabapentin  (NEURONTIN ) 100 MG capsule Take 200 mg  by mouth 3 (three) times daily.    [provider]  insulin  glargine-yfgn (SEMGLEE ) 100 UNIT/ML injection Inject 15 Units into the skin daily.    [provider]  isosorbide -hydrALAZINE  (BIDIL ) 20-37.5 MG tablet Take 2 tablets by mouth 3 (three) times daily. 05/25/23   Deforest Fast, MD  latanoprost  (XALATAN ) 0.005 % ophthalmic solution Place 1 drop into both eyes at bedtime. 04/10/23   [provider]  leptospermum manuka honey (MEDIHONEY) PSTE paste Apply 1 Application topically daily. Apply to buttocks and left heel as directed 09/09/23   Ozell Blunt, MD  melatonin 3 MG TABS tablet Take 3 mg by mouth at bedtime.    [provider]  methocarbamol  (ROBAXIN ) 500 MG tablet Take 1 tablet (500 mg total) by mouth every 6 (six) hours as needed for muscle spasms. 09/09/23   Ozell Blunt, MD  metoprolol  succinate (TOPROL -XL) 100 MG 24 hr tablet Take 100 mg by mouth daily. 05/29/23   [provider]  Multiple Vitamin (MULTIVITAMIN WITH MINERALS) TABS tablet Take 1 tablet by mouth daily. 08/16/18   Sheikh, Jonel Nephew Latif, DO  nitroGLYCERIN   (NITROSTAT ) 0.4 MG SL tablet Place 1 tablet (0.4 mg total) under the tongue every 5 (five) minutes as needed for chest pain. 05/02/22   Patwardhan, Kaye Parsons, MD  ondansetron  (ZOFRAN ) 4 MG tablet Take 4 mg by mouth every 6 (six) hours as needed for nausea or vomiting.    [provider]  OXYGEN Inhale 3 L into the lungs continuous.    [provider]  pantoprazole  (PROTONIX ) 40 MG tablet Take 1 tablet (40 mg total) by mouth daily. 09/07/23 09/06/24  Ozell Blunt, MD  polyethylene glycol powder (GLYCOLAX /MIRALAX ) 17 GM/SCOOP powder Take 1 capful (17 g) by mouth daily. Patient taking differently: Take 17 g by mouth daily. PRN order: 17 g by mouth daily as needed for constipation 05/25/23   Deforest Fast, MD  pyridoxine  (B-6) 100 MG tablet Take 100 mg by mouth daily.    [provider]  torsemide  (DEMADEX ) 20 MG tablet Take 1 tablet (20 mg total) by mouth daily. 09/07/23   Ozell Blunt, MD  traZODone  (DESYREL ) 100 MG tablet Take 100 mg by mouth at bedtime. 05/29/23   [provider]  UNABLE TO FIND Take 30 mLs by mouth in the morning and at bedtime. Med Name: protein liquid    [provider]    Physical Exam: Vitals:   10/29/23 2200 10/29/23 2215 10/29/23 2230 10/29/23 2245  BP: (!) 171/79 (!) 175/89 (!) 168/86 (!) 162/91  Pulse: 69 70 71 69  Resp: 15 15 16 14   Temp: 98.6 F (37 C)     TempSrc:      SpO2: 100% 98% 100% 100%    Constitutional: NAD, calm, comfortable Vitals:   10/29/23 2200 10/29/23 2215 10/29/23 2230 10/29/23 2245  BP: (!) 171/79 (!) 175/89 (!) 168/86 (!) 162/91  Pulse: 69 70 71 69  Resp: 15 15 16 14   Temp: 98.6 F (37 C)     TempSrc:      SpO2: 100% 98% 100% 100%   Eyes: PERRL, lids and conjunctivae normal ENMT: Mucous membranes are moist. Posterior pharynx clear of any exudate or lesions.Normal dentition.  Neck: normal, supple, no masses, no thyromegaly Respiratory: no wheezing, +crackles. Normal respiratory effort. No  accessory muscle use.  Cardiovascular: Regular rate and rhythm, no murmurs / rubs / gallops. Trace extremity edema. Abdomen: no tenderness, no masses palpated. No hepatosplenomegaly.  Bowel sounds positive.  Musculoskeletal: no clubbing / cyanosis. No joint deformity upper and lower extremities. Good ROM, no contractures. Normal muscle tone. Multiple toe amputations Skin: no rashes, lesions, ulcers. No induration/ chronic wound on lower extremity  Neurologic: CN 2-12 grossly intact. Sensation intact, DTR normal. Strength 5/5 in all 4.  Psychiatric: Normal judgment and insight. Alert and oriented x 3. Normal mood.    Labs on Admission: I have personally reviewed following labs and imaging studies  CBC: Recent Labs  Lab 10/29/23 1746  WBC 8.8  NEUTROABS 7.0  HGB 8.1*  HCT 26.1*  MCV 109.7*  PLT 237   Basic Metabolic Panel: Recent Labs  Lab 10/29/23 1746  NA 141  K 3.6  CL 107  CO2 23  GLUCOSE 141*  BUN 32*  CREATININE 1.97*  CALCIUM  8.0*   GFR: CrCl cannot be calculated (Unknown ideal weight.). Liver Function Tests: No results for input(s): AST, ALT, ALKPHOS, BILITOT, PROT, ALBUMIN  in the last 168 hours. No results for input(s): LIPASE, AMYLASE in the last 168 hours. No results for input(s): AMMONIA in the last 168 hours. Coagulation Profile: No results for input(s): INR, PROTIME in the last 168 hours. Cardiac Enzymes: No results for input(s): CKTOTAL, CKMB, CKMBINDEX, TROPONINI in the last 168 hours. BNP (last 3 results) No results for input(s): PROBNP in the last 8760 hours. HbA1C: No results for input(s): HGBA1C in the last 72 hours. CBG: No results for input(s): GLUCAP in the last 168 hours. Lipid Profile: No results for input(s): CHOL, HDL, LDLCALC, TRIG, CHOLHDL, LDLDIRECT in the last 72 hours. Thyroid  Function Tests: No results for input(s): TSH, T4TOTAL, FREET4, T3FREE, THYROIDAB in the last 72  hours. Anemia Panel: No results for input(s): VITAMINB12, FOLATE, FERRITIN, TIBC, IRON , RETICCTPCT in the last 72 hours. Urine analysis:    Component Value Date/Time   COLORURINE STRAW (A) 09/06/2023 1557   APPEARANCEUR CLEAR 09/06/2023 1557   LABSPEC 1.008 09/06/2023 1557   PHURINE 5.0 09/06/2023 1557   GLUCOSEU NEGATIVE 09/06/2023 1557   HGBUR SMALL (A) 09/06/2023 1557   BILIRUBINUR NEGATIVE 09/06/2023 1557   KETONESUR NEGATIVE 09/06/2023 1557   PROTEINUR 30 (A) 09/06/2023 1557   UROBILINOGEN 1.0 02/13/2015 1934   NITRITE NEGATIVE 09/06/2023 1557   LEUKOCYTESUR NEGATIVE 09/06/2023 1557    Radiological Exams on Admission: CT Angio Chest PE W and/or Wo Contrast Result Date: 10/29/2023 CLINICAL DATA:  Shortness of breath for 2 days getting worse. History of CHF. EXAM: CT ANGIOGRAPHY CHEST WITH CONTRAST TECHNIQUE: Multidetector CT imaging of the chest was performed using the standard protocol during bolus administration of intravenous contrast. Multiplanar CT image reconstructions and MIPs were obtained to evaluate the vascular anatomy. RADIATION DOSE REDUCTION: This exam was performed according to the departmental dose-optimization program which includes automated exposure control, adjustment of the mA and/or kV according to patient size and/or use of iterative reconstruction technique. CONTRAST:  60mL OMNIPAQUE  IOHEXOL  350 MG/ML SOLN COMPARISON:  Same day chest radiograph and CT chest 05/05/2023 FINDINGS: Cardiovascular: Negative for acute pulmonary embolism. Dilated main pulmonary artery measuring 34 mm in diameter. Dilated ascending aorta measuring 40 mm in diameter. Cardiomegaly. Trace pericardial effusion. Coronary artery and aortic atherosclerotic calcification. Mediastinum/Nodes: Shotty mediastinal and hilar lymph nodes are favored reactive. Trachea and esophagus are unremarkable. Lungs/Pleura: Marked interlobular septal thickening throughout both lungs. Peribronchovascular  ground-glass and consolidative opacities greatest in the right lower lobe. Moderate right small left pleural effusions. No pneumothorax. Upper Abdomen: Question gastric wall thickening versus underdistention. Otherwise no acute  abnormality. Musculoskeletal: No acute fracture. Review of the MIP images confirms the above findings. IMPRESSION: 1. Negative for acute pulmonary embolism. 2. CHF with pulmonary edema. Moderate right and small left pleural effusions. Superimposed pneumonia in the right lower lobe is not excluded. 3. Dilated main pulmonary artery, which can be seen in the setting of pulmonary hypertension. 4. Question gastritis versus underdistention. 5. Aortic Atherosclerosis (ICD10-I70.0). Electronically Signed   By: Rozell Cornet M.D.   On: 10/29/2023 21:20   DG Chest Portable 1 View Result Date: 10/29/2023 CLINICAL DATA:  Shortness of breath EXAM: PORTABLE CHEST 1 VIEW COMPARISON:  05/19/2023 FINDINGS: Heart is mildly enlarged. Mild vascular congestion. Small right pleural effusion again noted with peripheral airspace opacities throughout the right lung, similar prior study. Diffuse interstitial opacities concerning for edema. IMPRESSION: Cardiomegaly with vascular congestion and probable interstitial edema. Small right pleural effusion with chronic peripheral densities in the right mid and lower lung, favor scarring. Electronically Signed   By: Janeece Mechanic M.D.   On: 10/29/2023 18:35    EKG: Independently reviewed. See above  Assessment/Plan  Acute on Chronic combined systolic/diastolic  CHF exacerbation  with associate acute hypoxic respiratory failure  -admit to progressive care -placed on CHF protocol  - lasix  iv 60mg  bid  -Continue metoprolol  and BiDil . - update echo in am  -consider cardiology consult  -wean O2 as able  -strict I/o daily weights   CAP presumed bacterial  -ctx/ azithromycin , de-escalate as able  -pulmonary toilet  -urine ag, sputum, f/u on culture data    Abnormal Cardiac Enzyme  -presumed due to demand -will continue to cycle  - update echo in am    Hx of Anemia of Chronic diseaes -currently stable at baseline of hgb of 8  Hypertension   -continue metoprolol , Bidil   -currently not at goal , expect to improve with diuresis   CKDIIIb -at baseline cr 1.79    CAD s/p stent  -continue beta-blocker, aspirin  and statin. -no active issues    DMII -last AC 1 - iss/fs   Chronic diabetic foot ulcer b/l  - wound care to see  - consider podiatry consult       DVT prophylaxis: heparin  Code Status: full/ as discussed per patient wishes in event of cardiac arrest  Family Communication: none at beside Disposition Plan: patient  expected to be admitted greater than 2 midnights  Consults called: cardiology Admission status: progressive care   Sabas Cradle MD [facility]   If 7PM-7AM, please contact night-coverage [email or web address] Password [facility]  10/29/2023, 11:16 PM

## 2023-10-30 ENCOUNTER — Inpatient Hospital Stay (HOSPITAL_COMMUNITY)

## 2023-10-30 ENCOUNTER — Other Ambulatory Visit: Payer: Self-pay

## 2023-10-30 DIAGNOSIS — I272 Pulmonary hypertension, unspecified: Secondary | ICD-10-CM | POA: Diagnosis present

## 2023-10-30 DIAGNOSIS — Z1152 Encounter for screening for COVID-19: Secondary | ICD-10-CM | POA: Diagnosis not present

## 2023-10-30 DIAGNOSIS — I5043 Acute on chronic combined systolic (congestive) and diastolic (congestive) heart failure: Secondary | ICD-10-CM

## 2023-10-30 DIAGNOSIS — I2489 Other forms of acute ischemic heart disease: Secondary | ICD-10-CM | POA: Diagnosis present

## 2023-10-30 DIAGNOSIS — I2510 Atherosclerotic heart disease of native coronary artery without angina pectoris: Secondary | ICD-10-CM | POA: Diagnosis present

## 2023-10-30 DIAGNOSIS — N1832 Chronic kidney disease, stage 3b: Secondary | ICD-10-CM | POA: Diagnosis not present

## 2023-10-30 DIAGNOSIS — R0602 Shortness of breath: Secondary | ICD-10-CM | POA: Diagnosis present

## 2023-10-30 DIAGNOSIS — J9601 Acute respiratory failure with hypoxia: Secondary | ICD-10-CM | POA: Diagnosis present

## 2023-10-30 DIAGNOSIS — I509 Heart failure, unspecified: Secondary | ICD-10-CM | POA: Diagnosis not present

## 2023-10-30 DIAGNOSIS — D500 Iron deficiency anemia secondary to blood loss (chronic): Secondary | ICD-10-CM | POA: Diagnosis present

## 2023-10-30 DIAGNOSIS — E1169 Type 2 diabetes mellitus with other specified complication: Secondary | ICD-10-CM | POA: Diagnosis present

## 2023-10-30 DIAGNOSIS — J159 Unspecified bacterial pneumonia: Secondary | ICD-10-CM | POA: Diagnosis present

## 2023-10-30 DIAGNOSIS — E785 Hyperlipidemia, unspecified: Secondary | ICD-10-CM | POA: Diagnosis present

## 2023-10-30 DIAGNOSIS — L89620 Pressure ulcer of left heel, unstageable: Secondary | ICD-10-CM | POA: Diagnosis present

## 2023-10-30 DIAGNOSIS — L89610 Pressure ulcer of right heel, unstageable: Secondary | ICD-10-CM | POA: Diagnosis present

## 2023-10-30 DIAGNOSIS — I130 Hypertensive heart and chronic kidney disease with heart failure and stage 1 through stage 4 chronic kidney disease, or unspecified chronic kidney disease: Secondary | ICD-10-CM | POA: Diagnosis present

## 2023-10-30 DIAGNOSIS — Z515 Encounter for palliative care: Secondary | ICD-10-CM | POA: Diagnosis not present

## 2023-10-30 DIAGNOSIS — E11621 Type 2 diabetes mellitus with foot ulcer: Secondary | ICD-10-CM | POA: Diagnosis present

## 2023-10-30 DIAGNOSIS — Z7189 Other specified counseling: Secondary | ICD-10-CM | POA: Diagnosis not present

## 2023-10-30 DIAGNOSIS — E11628 Type 2 diabetes mellitus with other skin complications: Secondary | ICD-10-CM | POA: Diagnosis present

## 2023-10-30 DIAGNOSIS — J189 Pneumonia, unspecified organism: Secondary | ICD-10-CM | POA: Diagnosis not present

## 2023-10-30 DIAGNOSIS — N179 Acute kidney failure, unspecified: Secondary | ICD-10-CM | POA: Diagnosis present

## 2023-10-30 DIAGNOSIS — F419 Anxiety disorder, unspecified: Secondary | ICD-10-CM | POA: Diagnosis present

## 2023-10-30 DIAGNOSIS — E11319 Type 2 diabetes mellitus with unspecified diabetic retinopathy without macular edema: Secondary | ICD-10-CM | POA: Diagnosis present

## 2023-10-30 DIAGNOSIS — I5033 Acute on chronic diastolic (congestive) heart failure: Secondary | ICD-10-CM | POA: Diagnosis not present

## 2023-10-30 DIAGNOSIS — R531 Weakness: Secondary | ICD-10-CM | POA: Diagnosis not present

## 2023-10-30 DIAGNOSIS — E1122 Type 2 diabetes mellitus with diabetic chronic kidney disease: Secondary | ICD-10-CM | POA: Diagnosis present

## 2023-10-30 DIAGNOSIS — K259 Gastric ulcer, unspecified as acute or chronic, without hemorrhage or perforation: Secondary | ICD-10-CM | POA: Diagnosis present

## 2023-10-30 DIAGNOSIS — N184 Chronic kidney disease, stage 4 (severe): Secondary | ICD-10-CM | POA: Diagnosis present

## 2023-10-30 DIAGNOSIS — I7090 Unspecified atherosclerosis: Secondary | ICD-10-CM | POA: Diagnosis not present

## 2023-10-30 DIAGNOSIS — D631 Anemia in chronic kidney disease: Secondary | ICD-10-CM | POA: Diagnosis present

## 2023-10-30 DIAGNOSIS — Z794 Long term (current) use of insulin: Secondary | ICD-10-CM | POA: Diagnosis not present

## 2023-10-30 DIAGNOSIS — E1140 Type 2 diabetes mellitus with diabetic neuropathy, unspecified: Secondary | ICD-10-CM | POA: Diagnosis present

## 2023-10-30 LAB — ECHOCARDIOGRAM COMPLETE
AR max vel: 2.24 cm2
AV Area VTI: 2.1 cm2
AV Area mean vel: 2.32 cm2
AV Mean grad: 3 mmHg
AV Peak grad: 6.3 mmHg
Ao pk vel: 1.25 m/s
Area-P 1/2: 4.49 cm2
Calc EF: 42.5 %
S' Lateral: 3.7 cm
Single Plane A2C EF: 43.4 %
Single Plane A4C EF: 40.5 %

## 2023-10-30 LAB — CBC
HCT: 27.7 % — ABNORMAL LOW (ref 39.0–52.0)
HCT: 26.4 % — ABNORMAL LOW (ref 39.0–52.0)
Hemoglobin: 8.7 g/dL — ABNORMAL LOW (ref 13.0–17.0)
Hemoglobin: 8.4 g/dL — ABNORMAL LOW (ref 13.0–17.0)
MCH: 34.5 pg — ABNORMAL HIGH (ref 26.0–34.0)
MCH: 34.3 pg — ABNORMAL HIGH (ref 26.0–34.0)
MCHC: 31.4 g/dL (ref 30.0–36.0)
MCHC: 31.8 g/dL (ref 30.0–36.0)
MCV: 109.9 fL — ABNORMAL HIGH (ref 80.0–100.0)
MCV: 107.8 fL — ABNORMAL HIGH (ref 80.0–100.0)
Platelets: 253 10*3/uL (ref 150–400)
Platelets: 249 10*3/uL (ref 150–400)
RBC: 2.52 MIL/uL — ABNORMAL LOW (ref 4.22–5.81)
RBC: 2.45 MIL/uL — ABNORMAL LOW (ref 4.22–5.81)
RDW: 18.1 % — ABNORMAL HIGH (ref 11.5–15.5)
RDW: 17.7 % — ABNORMAL HIGH (ref 11.5–15.5)
WBC: 10 10*3/uL (ref 4.0–10.5)
WBC: 9.7 10*3/uL (ref 4.0–10.5)
nRBC: 0 % (ref 0.0–0.2)
nRBC: 0 % (ref 0.0–0.2)

## 2023-10-30 LAB — COMPREHENSIVE METABOLIC PANEL WITH GFR
ALT: 17 U/L (ref 0–44)
AST: 39 U/L (ref 15–41)
Albumin: 2.6 g/dL — ABNORMAL LOW (ref 3.5–5.0)
Alkaline Phosphatase: 121 U/L (ref 38–126)
Anion gap: 8 (ref 5–15)
BUN: 30 mg/dL — ABNORMAL HIGH (ref 8–23)
CO2: 25 mmol/L (ref 22–32)
Calcium: 8.2 mg/dL — ABNORMAL LOW (ref 8.9–10.3)
Chloride: 103 mmol/L (ref 98–111)
Creatinine, Ser: 2.06 mg/dL — ABNORMAL HIGH (ref 0.61–1.24)
GFR, Estimated: 33 mL/min — ABNORMAL LOW (ref >60)
Glucose, Bld: 138 mg/dL — ABNORMAL HIGH (ref 70–99)
Potassium: 3.7 mmol/L (ref 3.5–5.1)
Sodium: 136 mmol/L (ref 135–145)
Total Bilirubin: 1.3 mg/dL — ABNORMAL HIGH (ref 0.0–1.2)
Total Protein: 7.9 g/dL (ref 6.5–8.1)

## 2023-10-30 LAB — HEMOGLOBIN A1C
Hgb A1c MFr Bld: 6 % — ABNORMAL HIGH (ref 4.8–5.6)
Mean Plasma Glucose: 125.5 mg/dL

## 2023-10-30 LAB — CREATININE, SERUM
Creatinine, Ser: 2.02 mg/dL — ABNORMAL HIGH (ref 0.61–1.24)
GFR, Estimated: 34 mL/min — ABNORMAL LOW (ref >60)

## 2023-10-30 LAB — GLUCOSE, CAPILLARY
Glucose-Capillary: 156 mg/dL — ABNORMAL HIGH (ref 70–99)
Glucose-Capillary: 152 mg/dL — ABNORMAL HIGH (ref 70–99)

## 2023-10-30 LAB — CBG MONITORING, ED
Glucose-Capillary: 133 mg/dL — ABNORMAL HIGH (ref 70–99)
Glucose-Capillary: 186 mg/dL — ABNORMAL HIGH (ref 70–99)

## 2023-10-30 LAB — PROCALCITONIN: Procalcitonin: <0.1 ng/mL

## 2023-10-30 LAB — STREP PNEUMONIAE URINARY ANTIGEN: Strep Pneumo Urinary Antigen: NEGATIVE

## 2023-10-30 LAB — TROPONIN I (HIGH SENSITIVITY): Troponin I (High Sensitivity): 32 ng/L — ABNORMAL HIGH (ref <18)

## 2023-10-30 MED ORDER — ACETAMINOPHEN 650 MG RE SUPP: 650.0000 mg | Freq: Four times a day (QID) | RECTAL | Status: DC | PRN

## 2023-10-30 MED ORDER — HEPARIN SODIUM (PORCINE) 5000 UNIT/ML IJ SOLN
5000.0000 [IU] | Freq: Three times a day (TID) | INTRAMUSCULAR | Status: DC
  Administered 2023-10-30 – 2023-11-04 (×12): 5000 [IU] via SUBCUTANEOUS
  Filled 2023-10-30 (×16): qty 1

## 2023-10-30 MED ORDER — ACETAMINOPHEN 325 MG PO TABS
650.0000 mg | ORAL_TABLET | Freq: Four times a day (QID) | ORAL | Status: DC | PRN
  Administered 2023-11-26: 650 mg via ORAL
  Filled 2023-10-30 (×2): qty 2

## 2023-10-30 MED ORDER — FUROSEMIDE 10 MG/ML IJ SOLN
40.0000 mg | Freq: Two times a day (BID) | INTRAMUSCULAR | Status: DC
Start: 2023-10-30 — End: 2023-10-30
  Filled 2023-10-30: qty 4

## 2023-10-30 MED ORDER — ALBUTEROL SULFATE (2.5 MG/3ML) 0.083% IN NEBU
2.5000 mg | INHALATION_SOLUTION | RESPIRATORY_TRACT | Status: DC | PRN
  Filled 2023-10-30: qty 3

## 2023-10-30 MED ORDER — INSULIN ASPART 100 UNIT/ML IJ SOLN
0.0000 [IU] | Freq: Three times a day (TID) | INTRAMUSCULAR | Status: DC
  Administered 2023-10-30 – 2023-11-06 (×10): 1 [IU] via SUBCUTANEOUS
  Administered 2023-11-08 – 2023-11-09 (×3): 2 [IU] via SUBCUTANEOUS
  Administered 2023-11-10: 1 [IU] via SUBCUTANEOUS
  Administered 2023-11-10: 3 [IU] via SUBCUTANEOUS
  Administered 2023-11-11 – 2023-11-12 (×3): 1 [IU] via SUBCUTANEOUS
  Administered 2023-11-12: 4 [IU] via SUBCUTANEOUS
  Administered 2023-11-12: 3 [IU] via SUBCUTANEOUS
  Administered 2023-11-13: 2 [IU] via SUBCUTANEOUS
  Administered 2023-11-13: 1 [IU] via SUBCUTANEOUS
  Administered 2023-11-14: 2 [IU] via SUBCUTANEOUS
  Administered 2023-11-14 – 2023-11-18 (×7): 1 [IU] via SUBCUTANEOUS
  Administered 2023-11-19: 3 [IU] via SUBCUTANEOUS
  Administered 2023-11-20: 2 [IU] via SUBCUTANEOUS
  Administered 2023-11-20 – 2023-11-22 (×4): 1 [IU] via SUBCUTANEOUS
  Administered 2023-11-24: 2 [IU] via SUBCUTANEOUS
  Administered 2023-11-24: 1 [IU] via SUBCUTANEOUS
  Filled 2023-10-30: qty 0.06

## 2023-10-30 MED ORDER — FUROSEMIDE 10 MG/ML IJ SOLN
40.0000 mg | Freq: Two times a day (BID) | INTRAMUSCULAR | Status: DC
  Administered 2023-10-30 – 2023-11-01 (×5): 40 mg via INTRAVENOUS
  Filled 2023-10-30 (×5): qty 4

## 2023-10-30 MED ORDER — SODIUM CHLORIDE 0.9 % IV SOLN
100.0000 mg | Freq: Two times a day (BID) | INTRAVENOUS | Status: DC
  Administered 2023-10-30 – 2023-11-01 (×5): 100 mg via INTRAVENOUS
  Filled 2023-10-30 (×6): qty 100

## 2023-10-30 MED ORDER — HYDROCODONE-ACETAMINOPHEN 5-325 MG PO TABS
1.0000 | ORAL_TABLET | Freq: Once | ORAL | Status: AC
  Administered 2023-10-30: 1 via ORAL
  Filled 2023-10-30: qty 1

## 2023-10-30 MED ORDER — SODIUM CHLORIDE 0.9 % IV SOLN
2.0000 g | INTRAVENOUS | Status: AC
  Administered 2023-10-30 – 2023-11-03 (×4): 2 g via INTRAVENOUS
  Filled 2023-10-30 (×5): qty 20

## 2023-10-30 NOTE — Progress Notes (Signed)
 PROGRESS NOTE    David [name]  XBJ:[id number] DOB: 1945/09/12 DOA: 10/29/2023 PCP: Merl Star, MD   Brief Narrative: 78 y.o. male with medical history significant of  CAD s/p stent, Combined systolic diastolic heart failure, CKDIII, DmII, GERD, HTN  who presents to ED BIBEMS  with complaint of sob x 2 days.  ON arrival EMS placed patient on NRB and transported to ED.  Patient of note ha interim history of admission 4/18-4/23 for diagnosis of symptomatic anemia for which he was treated with 2 units of PRBC. Of note at that time anemia was deemed related to anemia of chronic disease. Currently patient states he feels improved s/p treatment in ED. He notes no fever/chills/ n/v/d/ or abdominal pain or chest pain.  Patient was found to be having a right lower lobe pneumonia and CHF exacerbation and admitted for the same.  Chest x-ray showed cardiomegaly vascular congestion and small right pleural effusion with CT of the chest shows negative for PE, shows pulmonary edema, pneumonia in the right lower lobe.  Pulmonary hypertension. Assessment & Plan:   Principal Problem:   CHF (congestive heart failure) (HCC)   Acute on Chronic combined systolic/diastolic  CHF exacerbation  with associate acute hypoxic respiratory failure -patient takes torsemide  20 mg daily at home.  Unclear if he missed doses.  He responded well to the 60 mg of IV Lasix  he received in the ER.  Currently on Lasix  40 mg IV twice daily.  Continue.  Mild bump in creatinine noted.  Closely monitor labs daily.  Last echo from December 2024 with a EF of 45 to 40 to 45% noted.   CAP presumed bacterial  Continue Rocephin  azithromycin .  RSV COVID and flu negative. On 4 L of oxygen saturating 99%. Encourage incentive spirometry. Continue neb treatments.  Abnormal Cardiac Enzyme mildly elevated troponin at 32 -presumed due to demand Follow-up echo   Hx of Anemia of Chronic diseaes -currently stable at baseline of hgb of 8    Hypertension   -continue metoprolol , Bidil   -currently not at goal , expect to improve with diuresis    CKDIIIb -creatinine bumped to 2.06 noted on IV diuresis will follow-up in a.m. -at baseline cr 1.79    CAD s/p stent  -continue beta-blocker, aspirin  and statin. -no active issues    DMII hemoglobin A1c 6.0 on Lantus  at home CBG (last 3)  Recent Labs    10/30/23 0806 10/30/23 1208  GLUCAP 133* 186*      Chronic diabetic foot ulcer b/l  - wound care to see  - consider podiatry consult   Pressure Injury 05/12/23 Coccyx Mid Stage 2 -  Partial thickness loss of dermis presenting as a shallow open injury with a red, pink wound bed without slough. (Active)  05/12/23 0400  Location: Coccyx  Location Orientation: Mid  Staging: Stage 2 -  Partial thickness loss of dermis presenting as a shallow open injury with a red, pink wound bed without slough.  Wound Description (Comments):   DO NOT USE:  Present on Admission: Yes     Pressure Injury 05/12/23 Heel Left Deep Tissue Pressure Injury - Purple or maroon localized area of discolored intact skin or blood-filled blister due to damage of underlying soft tissue from pressure and/or shear. (Active)  05/12/23 0400  Location: Heel  Location Orientation: Left  Staging: Deep Tissue Pressure Injury - Purple or maroon localized area of discolored intact skin or blood-filled blister due to damage of underlying soft tissue from pressure  and/or shear.  Wound Description (Comments):   DO NOT USE:  Present on Admission: Yes    Estimated body mass index is 24.63 kg/m as calculated from the following:   Height as of 09/04/23: 6' 2 (1.88 m).   Weight as of 09/09/23: 87 kg.  DVT prophylaxis: Lovenox   code Status: Full code  Family Communication: None  disposition Plan:  Status is: Inpatient Remains inpatient appropriate because: Cute illness   Consultants:  None  Procedures: None  antimicrobials: Rocephin  and  azithromycin  Subjective: Resting in bed reports feeling better urinating well and speaking with him feels breathing is better than yesterday.  Objective: Vitals:   10/30/23 0800 10/30/23 0815 10/30/23 0830 10/30/23 0845  BP: (!) 171/82 (!) 175/80 (!) 169/81 (!) 173/81  Pulse: 67 65 60 69  Resp: 19 11 15 15   Temp:      TempSrc:      SpO2: 98% 98% 94% 98%    Intake/Output Summary (Last 24 hours) at 10/30/2023 8295 Last data filed at 10/30/2023 0711 Gross per 24 hour  Intake 830 ml  Output 1450 ml  Net -620 ml   There were no vitals filed for this visit.  Examination:  General exam: Appears in no acute distress  respiratory system: CRACKLES  at The bases. Respiratory effort normal. Cardiovascular system:reg edema  Gastrointestinal system: Abdomen is nondistended, soft and nontender. No organomegaly or masses felt. Normal bowel sounds heard. Central nervous system: Alert and oriented. No focal neurological deficits. Extremities: edema   Data Reviewed: I have personally reviewed following labs and imaging studies  CBC: Recent Labs  Lab 10/29/23 1746 10/30/23 0208 10/30/23 0507  WBC 8.8 10.0 9.7  NEUTROABS 7.0  --   --   HGB 8.1* 8.7* 8.4*  HCT 26.1* 27.7* 26.4*  MCV 109.7* 109.9* 107.8*  PLT 237 253 249   Basic Metabolic Panel: Recent Labs  Lab 10/29/23 1746 10/30/23 0208 10/30/23 0507  NA 141  --  136  K 3.6  --  3.7  CL 107  --  103  CO2 23  --  25  GLUCOSE 141*  --  138*  BUN 32*  --  30*  CREATININE 1.97* 2.02* 2.06*  CALCIUM  8.0*  --  8.2*   GFR: CrCl cannot be calculated (Unknown ideal weight.). Liver Function Tests: Recent Labs  Lab 10/30/23 0507  AST 39  ALT 17  ALKPHOS 121  BILITOT 1.3*  PROT 7.9  ALBUMIN  2.6*   No results for input(s): LIPASE, AMYLASE in the last 168 hours. No results for input(s): AMMONIA in the last 168 hours. Coagulation Profile: No results for input(s): INR, PROTIME in the last 168 hours. Cardiac  Enzymes: No results for input(s): CKTOTAL, CKMB, CKMBINDEX, TROPONINI in the last 168 hours. BNP (last 3 results) No results for input(s): PROBNP in the last 8760 hours. HbA1C: Recent Labs    10/30/23 0208  HGBA1C 6.0*   CBG: Recent Labs  Lab 10/30/23 0806  GLUCAP 133*   Lipid Profile: No results for input(s): CHOL, HDL, LDLCALC, TRIG, CHOLHDL, LDLDIRECT in the last 72 hours. Thyroid  Function Tests: No results for input(s): TSH, T4TOTAL, FREET4, T3FREE, THYROIDAB in the last 72 hours. Anemia Panel: No results for input(s): VITAMINB12, FOLATE, FERRITIN, TIBC, IRON , RETICCTPCT in the last 72 hours. Sepsis Labs: Recent Labs  Lab 10/30/23 0208  PROCALCITON <0.10    Recent Results (from the past 240 hours)  Culture, blood (routine x 2) Call MD if unable to obtain prior to  antibiotics being given     Status: None (Preliminary result)   Collection Time: 10/30/23  2:06 AM   Specimen: BLOOD LEFT ARM  Result Value Ref Range Status   Specimen Description   Final    BLOOD LEFT ARM Performed at [facility], [address] [place], [place] [address]    Special Requests   Final    BOTTLES DRAWN AEROBIC AND ANAEROBIC Blood Culture results may not be optimal due to an inadequate volume of blood received in culture bottles Performed at [facility], [address], [place], [place] [address]    Culture   Final    NO GROWTH < 12 HOURS Performed at [facility], [address], [place], [place] [address]    Report Status PENDING  Incomplete  Culture, blood (routine x 2) Call MD if unable to obtain prior to antibiotics being given     Status: None (Preliminary result)   Collection Time: 10/30/23  5:28 AM   Specimen: BLOOD RIGHT HAND  Result Value Ref Range Status   Specimen Description   Final    BLOOD RIGHT HAND Performed at [facility], [address], [place], [place] [address]    Special Requests    Final    BOTTLES DRAWN AEROBIC AND ANAEROBIC Blood Culture results may not be optimal due to an inadequate volume of blood received in culture bottles Performed at [facility], [address] [place], [place] [address]    Culture PENDING  Incomplete   Report Status PENDING  Incomplete         Radiology Studies: CT Angio Chest PE W and/or Wo Contrast Result Date: 10/29/2023 CLINICAL DATA:  Shortness of breath for 2 days getting worse. History of CHF. EXAM: CT ANGIOGRAPHY CHEST WITH CONTRAST TECHNIQUE: Multidetector CT imaging of the chest was performed using the standard protocol during bolus administration of intravenous contrast. Multiplanar CT image reconstructions and MIPs were obtained to evaluate the vascular anatomy. RADIATION DOSE REDUCTION: This exam was performed according to the departmental dose-optimization program which includes automated exposure control, adjustment of the mA and/or kV according to patient size and/or use of iterative reconstruction technique. CONTRAST:  60mL OMNIPAQUE  IOHEXOL  350 MG/ML SOLN COMPARISON:  Same day chest radiograph and CT chest 05/05/2023 FINDINGS: Cardiovascular: Negative for acute pulmonary embolism. Dilated main pulmonary artery measuring 34 mm in diameter. Dilated ascending aorta measuring 40 mm in diameter. Cardiomegaly. Trace pericardial effusion. Coronary artery and aortic atherosclerotic calcification. Mediastinum/Nodes: Shotty mediastinal and hilar lymph nodes are favored reactive. Trachea and esophagus are unremarkable. Lungs/Pleura: Marked interlobular septal thickening throughout both lungs. Peribronchovascular ground-glass and consolidative opacities greatest in the right lower lobe. Moderate right small left pleural effusions. No pneumothorax. Upper Abdomen: Question gastric wall thickening versus underdistention. Otherwise no acute abnormality. Musculoskeletal: No acute fracture. Review of the MIP images confirms the  above findings. IMPRESSION: 1. Negative for acute pulmonary embolism. 2. CHF with pulmonary edema. Moderate right and small left pleural effusions. Superimposed pneumonia in the right lower lobe is not excluded. 3. Dilated main pulmonary artery, which can be seen in the setting of pulmonary hypertension. 4. Question gastritis versus underdistention. 5. Aortic Atherosclerosis (ICD10-I70.0). Electronically Signed   By: Rozell Cornet M.D.   On: 10/29/2023 21:20   DG Chest Portable 1 View Result Date: 10/29/2023 CLINICAL DATA:  Shortness of breath EXAM: PORTABLE CHEST 1 VIEW COMPARISON:  05/19/2023 FINDINGS: Heart is mildly enlarged. Mild vascular congestion. Small  right pleural effusion again noted with peripheral airspace opacities throughout the right lung, similar prior study. Diffuse interstitial opacities concerning for edema. IMPRESSION: Cardiomegaly with vascular congestion and probable interstitial edema. Small right pleural effusion with chronic peripheral densities in the right mid and lower lung, favor scarring. Electronically Signed   By: Janeece Mechanic M.D.   On: 10/29/2023 18:35    Scheduled Meds:  furosemide   40 mg Intravenous Q12H   heparin   5,000 Units Subcutaneous Q8H   insulin  aspart  0-6 Units Subcutaneous TID WC   Continuous Infusions:  cefTRIAXone  (ROCEPHIN )  IV     doxycycline  (VIBRAMYCIN ) IV       LOS: 0 days    Time spent: 50 min  Barbee Lew, MD  10/30/2023, 9:37 AM

## 2023-10-30 NOTE — Consult Note (Addendum)
 Please note that the [facility] nursing team is utilizing a standardized work plan to manage patient consults. We are triaging consults and will try to see the patients within 48 hours. Wound photos in the patient's chart allow us  to consult on the patient in the most efficient and timely manner.    Topical care pending review of wound photos, last photos in the chart 08/2023 at which time patient had left plantar surface heel wound and plantar 5th metatarsal head wound. And several superficial wounds on the lateral aspect of the right foot.   No imaging that I can find since 05/06/2023 for the left foot  Cashius Grandstaff [facility], CNS, CWON-AP [phone]

## 2023-10-30 NOTE — ED Notes (Addendum)
 Pt alert, NAD, calm, interactive, resps e/u, speaking clearly. Last ate yesterday am. Denies pain. Lasix  given. Urine noted in canister, diuresing.

## 2023-10-31 ENCOUNTER — Encounter (HOSPITAL_COMMUNITY): Payer: Self-pay

## 2023-10-31 DIAGNOSIS — J189 Pneumonia, unspecified organism: Secondary | ICD-10-CM | POA: Diagnosis not present

## 2023-10-31 LAB — GLUCOSE, CAPILLARY
Glucose-Capillary: 191 mg/dL — ABNORMAL HIGH (ref 70–99)
Glucose-Capillary: 134 mg/dL — ABNORMAL HIGH (ref 70–99)
Glucose-Capillary: 200 mg/dL — ABNORMAL HIGH (ref 70–99)
Glucose-Capillary: 159 mg/dL — ABNORMAL HIGH (ref 70–99)

## 2023-10-31 MED ORDER — LUBIPROSTONE 24 MCG PO CAPS
24.0000 ug | ORAL_CAPSULE | Freq: Every day | ORAL | Status: DC
  Administered 2023-11-01 – 2023-11-07 (×5): 24 ug via ORAL
  Filled 2023-10-31 (×8): qty 1

## 2023-10-31 MED ORDER — TRAZODONE HCL 100 MG PO TABS
100.0000 mg | ORAL_TABLET | Freq: Every day | ORAL | Status: DC
  Administered 2023-10-31 – 2023-11-25 (×24): 100 mg via ORAL
  Filled 2023-10-31 (×26): qty 1

## 2023-10-31 MED ORDER — SIMETHICONE 80 MG PO CHEW
80.0000 mg | CHEWABLE_TABLET | Freq: Four times a day (QID) | ORAL | Status: DC | PRN
  Administered 2023-10-31 – 2023-11-06 (×5): 80 mg via ORAL
  Filled 2023-10-31 (×5): qty 1

## 2023-10-31 MED ORDER — LATANOPROST 0.005 % OP SOLN
1.0000 [drp] | Freq: Every day | OPHTHALMIC | Status: DC
  Administered 2023-10-31 – 2023-11-25 (×23): 1 [drp] via OPHTHALMIC
  Filled 2023-10-31 (×2): qty 2.5

## 2023-10-31 MED ORDER — TRAMADOL HCL 50 MG PO TABS
50.0000 mg | ORAL_TABLET | Freq: Three times a day (TID) | ORAL | Status: DC | PRN
  Administered 2023-10-31 – 2023-11-13 (×21): 50 mg via ORAL
  Filled 2023-10-31 (×23): qty 1

## 2023-10-31 MED ORDER — SERTRALINE HCL 25 MG PO TABS
25.0000 mg | ORAL_TABLET | Freq: Every day | ORAL | Status: DC
  Administered 2023-11-03 – 2023-11-24 (×17): 25 mg via ORAL
  Filled 2023-10-31 (×25): qty 1

## 2023-10-31 MED ORDER — ISOSORB DINITRATE-HYDRALAZINE 20-37.5 MG PO TABS
2.0000 | ORAL_TABLET | Freq: Three times a day (TID) | ORAL | Status: DC
  Administered 2023-10-31 – 2023-11-26 (×67): 2 via ORAL
  Filled 2023-10-31 (×79): qty 2

## 2023-10-31 MED ORDER — METOPROLOL SUCCINATE ER 100 MG PO TB24
100.0000 mg | ORAL_TABLET | Freq: Every day | ORAL | Status: DC
  Administered 2023-11-01: 100 mg via ORAL
  Filled 2023-10-31: qty 1

## 2023-10-31 NOTE — Progress Notes (Signed)
 PROGRESS NOTE    David [name]  WUJ:[id number] DOB: [birth year] DOA: 10/29/2023 PCP: Merl Star, MD   Brief Narrative: 78 y.o. male with medical history significant of  CAD s/p stent, Combined systolic diastolic heart failure, CKDIII, DmII, GERD, HTN  who presents to ED BIBEMS  with complaint of sob x 2 days.  ON arrival EMS placed patient on NRB and transported to ED.  Patient of note ha interim history of admission 4/18-4/23 for diagnosis of symptomatic anemia for which he was treated with 2 units of PRBC. Of note at that time anemia was deemed related to anemia of chronic disease. Currently patient states he feels improved s/p treatment in ED. He notes no fever/chills/ n/v/d/ or abdominal pain or chest pain.  Patient was found to be having a right lower lobe pneumonia and CHF exacerbation and admitted for the same.  Chest x-ray showed cardiomegaly vascular congestion and small right pleural effusion with CT of the chest shows negative for PE, shows pulmonary edema, pneumonia in the right lower lobe.  Pulmonary hypertension. Assessment & Plan:   Principal Problem:   CHF (congestive heart failure) (HCC) Active Problems:   Pneumonia due to infectious organism   Acute on Chronic combined systolic/diastolic  CHF exacerbation  with associate acute hypoxic respiratory failure -patient takes torsemide  20 mg daily at home.  Unclear if he missed doses.  He responded well to the 60 mg of IV Lasix  he received in the ER.  Currently on Lasix  40 mg IV twice daily.  He is negative by 1.5 L last 24 hours.  Mild bump in creatinine noted.  Lab is pending from today.  He was started on oxygen at 2 L after his hospital stay in December 2024.  At that time he refused thoracentesis.  However he had to be intubated.  Last echo from December 2024 with a EF of 45 to 40 to 45% noted. CT chest on admission showed no evidence of acute pulmonary embolism, CHF with pulmonary edema and moderate right and small left  pleural effusion, superimposed pneumonia in the right lower lobe not excluded, dilated main pulmonary artery.  CAP presumed bacterial  Continue Rocephin  azithromycin .  RSV COVID and flu negative. On 4 L of oxygen saturating 99%. Encourage incentive spirometry. Continue neb treatments. Taper oxygen to baseline 2 L as tolerated and able.  Abnormal Cardiac Enzyme mildly elevated troponin at 32 -presumed due to demand Follow-up echo   Hx of Anemia of Chronic diseaes -currently stable at baseline of hgb of 8   Hypertension   -continue metoprolol , Bidil   -currently not at goal , expect to improve with diuresis    CKDIIIb/stage IV-creatinine bumped to 2.06 noted on IV diuresis will follow-up in a.m. -at baseline cr 1.79 Follow-up labs pending today.    CAD s/p stent  -continue beta-blocker, aspirin  and statin. -no active issues    DMII hemoglobin A1c 6.0 on Lantus  at home CBG (last 3)  Recent Labs    10/30/23 1701 10/30/23 2021 10/31/23 0729  GLUCAP 156* 152* 191*      Chronic diabetic foot ulcer b/l  - wound care to see  - consider podiatry consult   Pressure Injury 05/12/23 Coccyx Mid Stage 2 -  Partial thickness loss of dermis presenting as a shallow open injury with a red, pink wound bed without slough. (Active)  05/12/23 0400  Location: Coccyx  Location Orientation: Mid  Staging: Stage 2 -  Partial thickness loss of dermis presenting as a shallow  open injury with a red, pink wound bed without slough.  Wound Description (Comments):   DO NOT USE:  Present on Admission: Yes     Pressure Injury 05/12/23 Heel Left Deep Tissue Pressure Injury - Purple or maroon localized area of discolored intact skin or blood-filled blister due to damage of underlying soft tissue from pressure and/or shear. (Active)  05/12/23 0400  Location: Heel  Location Orientation: Left  Staging: Deep Tissue Pressure Injury - Purple or maroon localized area of discolored intact skin or blood-filled  blister due to damage of underlying soft tissue from pressure and/or shear.  Wound Description (Comments):   DO NOT USE:  Present on Admission: Yes    Estimated body mass index is 24.87 kg/m as calculated from the following:   Height as of this encounter: 6' 2.02 (1.88 m).   Weight as of this encounter: 87.9 kg.  DVT prophylaxis: Lovenox   code Status: Full code  Family Communication: None  disposition Plan:  Status is: Inpatient Remains inpatient appropriate because: Cute illness   Consultants:  None  Procedures: None  antimicrobials: Rocephin  and azithromycin  Subjective:  He is resting in bed patient lives at home with his wife mostly bedbound or wheelchair-bound has not walked since November of last year has bilateral lower extremity multiple toe amputations he was recommended to follow-up with vascular as an outpatient which she has not done Reports he did not eat anything as his food could not be warmed up by the staff did not sleep well  Objective: Vitals:   10/30/23 1400 10/30/23 2020 10/31/23 0457 10/31/23 0500  BP:  (!) 164/85 (!) 160/74   Pulse:  61 63   Resp:  18 16   Temp:  98.4 F (36.9 C) 98 F (36.7 C)   TempSrc:  Oral Oral   SpO2:  100% 100%   Weight:    87.9 kg  Height: 6' 2.02 (1.88 m)       Intake/Output Summary (Last 24 hours) at 10/31/2023 0918 Last data filed at 10/31/2023 0843 Gross per 24 hour  Intake 864.79 ml  Output 2450 ml  Net -1585.21 ml   Filed Weights   10/31/23 0500  Weight: 87.9 kg    Examination:  General exam: Appears in no acute distress  respiratory system: CRACKLES  at The bases. Respiratory effort normal. Cardiovascular system:reg edema  Gastrointestinal system: Abdomen is nondistended, soft and nontender. No organomegaly or masses felt. Normal bowel sounds heard. Central nervous system: Alert and oriented. No focal neurological deficits. Extremities: Chronic ischemic changes in both feet amputation of multiple toes  noted.  Data Reviewed: I have personally reviewed following labs and imaging studies  CBC: Recent Labs  Lab 10/29/23 1746 10/30/23 0208 10/30/23 0507  WBC 8.8 10.0 9.7  NEUTROABS 7.0  --   --   HGB 8.1* 8.7* 8.4*  HCT 26.1* 27.7* 26.4*  MCV 109.7* 109.9* 107.8*  PLT 237 253 249   Basic Metabolic Panel: Recent Labs  Lab 10/29/23 1746 10/30/23 0208 10/30/23 0507  NA 141  --  136  K 3.6  --  3.7  CL 107  --  103  CO2 23  --  25  GLUCOSE 141*  --  138*  BUN 32*  --  30*  CREATININE 1.97* 2.02* 2.06*  CALCIUM  8.0*  --  8.2*   GFR: Estimated Creatinine Clearance: 35.5 mL/min (A) (by C-G formula based on SCr of 2.06 mg/dL (H)). Liver Function Tests: Recent Labs  Lab 10/30/23 0507  AST 39  ALT 17  ALKPHOS 121  BILITOT 1.3*  PROT 7.9  ALBUMIN  2.6*   No results for input(s): LIPASE, AMYLASE in the last 168 hours. No results for input(s): AMMONIA in the last 168 hours. Coagulation Profile: No results for input(s): INR, PROTIME in the last 168 hours. Cardiac Enzymes: No results for input(s): CKTOTAL, CKMB, CKMBINDEX, TROPONINI in the last 168 hours. BNP (last 3 results) No results for input(s): PROBNP in the last 8760 hours. HbA1C: Recent Labs    10/30/23 0208  HGBA1C 6.0*   CBG: Recent Labs  Lab 10/30/23 0806 10/30/23 1208 10/30/23 1701 10/30/23 2021 10/31/23 0729  GLUCAP 133* 186* 156* 152* 191*   Lipid Profile: No results for input(s): CHOL, HDL, LDLCALC, TRIG, CHOLHDL, LDLDIRECT in the last 72 hours. Thyroid  Function Tests: No results for input(s): TSH, T4TOTAL, FREET4, T3FREE, THYROIDAB in the last 72 hours. Anemia Panel: No results for input(s): VITAMINB12, FOLATE, FERRITIN, TIBC, IRON , RETICCTPCT in the last 72 hours. Sepsis Labs: Recent Labs  Lab 10/30/23 0208  PROCALCITON <0.10    Recent Results (from the past 240 hours)  Culture, blood (routine x 2) Call MD if unable to obtain prior  to antibiotics being given     Status: None (Preliminary result)   Collection Time: 10/30/23  2:06 AM   Specimen: BLOOD LEFT ARM  Result Value Ref Range Status   Specimen Description   Final    BLOOD LEFT ARM Performed at [facility], [address], [place], [place] [address]    Special Requests   Final    BOTTLES DRAWN AEROBIC AND ANAEROBIC Blood Culture results may not be optimal due to an inadequate volume of blood received in culture bottles Performed at [facility], [address], [place], [place] [address]    Culture   Final    NO GROWTH 1 DAY Performed at [facility], [address], [place], [place] [address]    Report Status PENDING  Incomplete  Culture, blood (routine x 2) Call MD if unable to obtain prior to antibiotics being given     Status: None (Preliminary result)   Collection Time: 10/30/23  5:28 AM   Specimen: BLOOD RIGHT HAND  Result Value Ref Range Status   Specimen Description   Final    BLOOD RIGHT HAND Performed at [facility], [address], [place], [place] [address]    Special Requests   Final    BOTTLES DRAWN AEROBIC AND ANAEROBIC Blood Culture results may not be optimal due to an inadequate volume of blood received in culture bottles Performed at [facility], [address] [place], [place] [address]    Culture   Final    NO GROWTH 1 DAY Performed at [facility], [address], [place], [place] [address]    Report Status PENDING  Incomplete         Radiology Studies: ECHOCARDIOGRAM COMPLETE Result Date: 10/30/2023    ECHOCARDIOGRAM REPORT   Patient Name:   SUE MCALEXANDER Date of Exam: 10/30/2023 Medical Rec #:  [id number]        Height:       74.0 in Accession #:    [id number]       Weight:       191.8 lb Date of Birth:  [birth year]       BSA:          2.135 m Patient Age:  77 years         BP:           177/87 mmHg Patient Gender: M                HR:           60 bpm.  Exam Location:  Inpatient Procedure: 2D Echo, Cardiac Doppler, Color Doppler and Strain Analysis (Both            Spectral and Color Flow Doppler were utilized during procedure). Indications:    chf  History:        Patient has prior history of Echocardiogram examinations, most                 recent 06/30/2022.  Sonographer:    Griselda Lederer Referring Phys: [id number] SARA-MAIZ A THOMAS IMPRESSIONS  1. Left ventricular ejection fraction, by estimation, is 35 to 40%. The left ventricle has moderately decreased function. The left ventricle demonstrates global hypokinesis. There is mild left ventricular hypertrophy. Left ventricular diastolic parameters are consistent with Grade II diastolic dysfunction (pseudonormalization). Elevated left atrial pressure. The average left ventricular global longitudinal strain is -12.2 %. The global longitudinal strain is abnormal.  2. Right ventricular systolic function is mildly reduced. The right ventricular size is mildly enlarged. There is severely elevated pulmonary artery systolic pressure. The estimated right ventricular systolic pressure is 89.3 mmHg.  3. Left atrial size was mildly dilated.  4. The mitral valve is normal in structure. Mild mitral valve regurgitation. No evidence of mitral stenosis.  5. The aortic valve is tricuspid. Aortic valve regurgitation is not visualized. No aortic stenosis is present.  6. The inferior vena cava is dilated in size with <50% respiratory variability, suggesting right atrial pressure of 15 mmHg. FINDINGS  Left Ventricle: Left ventricular ejection fraction, by estimation, is 35 to 40%. The left ventricle has moderately decreased function. The left ventricle demonstrates global hypokinesis. The average left ventricular global longitudinal strain is -12.2 %. Strain was performed and the global longitudinal strain is abnormal. The left ventricular internal cavity size was normal in size. There is mild left ventricular hypertrophy. Left  ventricular diastolic parameters are consistent with Grade II diastolic  dysfunction (pseudonormalization). Elevated left atrial pressure. Right Ventricle: The right ventricular size is mildly enlarged. No increase in right ventricular wall thickness. Right ventricular systolic function is mildly reduced. There is severely elevated pulmonary artery systolic pressure. The tricuspid regurgitant velocity is 4.31 m/s, and with an assumed right atrial pressure of 15 mmHg, the estimated right ventricular systolic pressure is 89.3 mmHg. Left Atrium: Left atrial size was mildly dilated. Right Atrium: Right atrial size was normal in size. Pericardium: There is no evidence of pericardial effusion. Mitral Valve: The mitral valve is normal in structure. Mild mitral valve regurgitation. No evidence of mitral valve stenosis. Tricuspid Valve: The tricuspid valve is normal in structure. Tricuspid valve regurgitation is mild. Aortic Valve: The aortic valve is tricuspid. Aortic valve regurgitation is not visualized. No aortic stenosis is present. Aortic valve mean gradient measures 3.0 mmHg. Aortic valve peak gradient measures 6.2 mmHg. Aortic valve area, by VTI measures 2.10 cm. Pulmonic Valve: The pulmonic valve was not well visualized. Pulmonic valve regurgitation is trivial. Aorta: The aortic root is normal in size and structure. Venous: The inferior vena cava is dilated in size with less than 50% respiratory variability, suggesting right atrial pressure of 15 mmHg. IAS/Shunts: The interatrial septum was not well visualized.  LEFT VENTRICLE PLAX 2D  LVIDd:         4.80 cm      Diastology LVIDs:         3.70 cm      LV e' medial:    4.79 cm/s LV PW:         1.10 cm      LV E/e' medial:  8.6 LV IVS:        1.20 cm      LV e' lateral:   4.82 cm/s LVOT diam:     2.20 cm      LV E/e' lateral: 8.5 LV SV:         59 LV SV Index:   28           2D Longitudinal Strain LVOT Area:     3.80 cm     2D Strain GLS Avg:     -12.2 %  LV Volumes  (MOD) LV vol d, MOD A2C: 172.0 ml LV vol d, MOD A4C: 144.0 ml LV vol s, MOD A2C: 97.4 ml LV vol s, MOD A4C: 85.7 ml LV SV MOD A2C:     74.6 ml LV SV MOD A4C:     144.0 ml LV SV MOD BP:      71.6 ml RIGHT VENTRICLE            IVC RV Basal diam:  4.60 cm    IVC diam: 2.30 cm RV S prime:     5.96 cm/s TAPSE (M-mode): 1.6 cm LEFT ATRIUM             Index        RIGHT ATRIUM           Index LA diam:        3.90 cm 1.83 cm/m   RA Area:     20.60 cm LA Vol (A2C):   81.6 ml 38.22 ml/m  RA Volume:   59.50 ml  27.87 ml/m LA Vol (A4C):   89.1 ml 41.73 ml/m LA Biplane Vol: 87.1 ml 40.79 ml/m  AORTIC VALVE AV Area (Vmax):    2.24 cm AV Area (Vmean):   2.32 cm AV Area (VTI):     2.10 cm AV Vmax:           125.00 cm/s AV Vmean:          84.700 cm/s AV VTI:            0.282 m AV Peak Grad:      6.2 mmHg AV Mean Grad:      3.0 mmHg LVOT Vmax:         73.80 cm/s LVOT Vmean:        51.700 cm/s LVOT VTI:          0.156 m LVOT/AV VTI ratio: 0.55  AORTA Ao Root diam: 3.00 cm MITRAL VALVE               TRICUSPID VALVE MV Area (PHT): 4.49 cm    TR Peak grad:   74.3 mmHg MV E velocity: 41.10 cm/s  TR Vmax:        431.00 cm/s MV A velocity: 58.30 cm/s MV E/A ratio:  0.70        SHUNTS                            Systemic VTI:  0.16 m  Systemic Diam: 2.20 cm Carson Clara MD Electronically signed by Carson Clara MD Signature Date/Time: 10/30/2023/4:35:21 PM    Final    CT Angio Chest PE W and/or Wo Contrast Result Date: 10/29/2023 CLINICAL DATA:  Shortness of breath for 2 days getting worse. History of CHF. EXAM: CT ANGIOGRAPHY CHEST WITH CONTRAST TECHNIQUE: Multidetector CT imaging of the chest was performed using the standard protocol during bolus administration of intravenous contrast. Multiplanar CT image reconstructions and MIPs were obtained to evaluate the vascular anatomy. RADIATION DOSE REDUCTION: This exam was performed according to the departmental dose-optimization program which  includes automated exposure control, adjustment of the mA and/or kV according to patient size and/or use of iterative reconstruction technique. CONTRAST:  60mL OMNIPAQUE  IOHEXOL  350 MG/ML SOLN COMPARISON:  Same day chest radiograph and CT chest 05/05/2023 FINDINGS: Cardiovascular: Negative for acute pulmonary embolism. Dilated main pulmonary artery measuring 34 mm in diameter. Dilated ascending aorta measuring 40 mm in diameter. Cardiomegaly. Trace pericardial effusion. Coronary artery and aortic atherosclerotic calcification. Mediastinum/Nodes: Shotty mediastinal and hilar lymph nodes are favored reactive. Trachea and esophagus are unremarkable. Lungs/Pleura: Marked interlobular septal thickening throughout both lungs. Peribronchovascular ground-glass and consolidative opacities greatest in the right lower lobe. Moderate right small left pleural effusions. No pneumothorax. Upper Abdomen: Question gastric wall thickening versus underdistention. Otherwise no acute abnormality. Musculoskeletal: No acute fracture. Review of the MIP images confirms the above findings. IMPRESSION: 1. Negative for acute pulmonary embolism. 2. CHF with pulmonary edema. Moderate right and small left pleural effusions. Superimposed pneumonia in the right lower lobe is not excluded. 3. Dilated main pulmonary artery, which can be seen in the setting of pulmonary hypertension. 4. Question gastritis versus underdistention. 5. Aortic Atherosclerosis (ICD10-I70.0). Electronically Signed   By: Rozell Cornet M.D.   On: 10/29/2023 21:20   DG Chest Portable 1 View Result Date: 10/29/2023 CLINICAL DATA:  Shortness of breath EXAM: PORTABLE CHEST 1 VIEW COMPARISON:  05/19/2023 FINDINGS: Heart is mildly enlarged. Mild vascular congestion. Small right pleural effusion again noted with peripheral airspace opacities throughout the right lung, similar prior study. Diffuse interstitial opacities concerning for edema. IMPRESSION: Cardiomegaly with vascular  congestion and probable interstitial edema. Small right pleural effusion with chronic peripheral densities in the right mid and lower lung, favor scarring. Electronically Signed   By: Janeece Mechanic M.D.   On: 10/29/2023 18:35    Scheduled Meds:  furosemide   40 mg Intravenous Q12H   heparin   5,000 Units Subcutaneous Q8H   insulin  aspart  0-6 Units Subcutaneous TID WC   Continuous Infusions:  cefTRIAXone  (ROCEPHIN )  IV Stopped (10/31/23 0715)   doxycycline  (VIBRAMYCIN ) IV 100 mg (10/31/23 0843)     LOS: 1 day    Time spent: 50 min  Barbee Lew, MD  10/31/2023, 9:18 AM

## 2023-10-31 NOTE — Consult Note (Signed)
 WOC Nurse Consult Note: Reason for Consult: LE wounds, patient noted to have sacral wound as well in review of images Wound type: Unstageable Pressure Injury right and left heel; left and right lateral feet'; all are 100% eschar  Arterial ulceration left medial great toe; 100% clean Pressure Injury POA: Yes Measurement: see nursing flow sheets Wound bed:see above  Drainage (amount, consistency, odor) see nursing flow sheets Periwound: foot/toe deformities  Dressing procedure/placement/frequency: Cleanse all foot wounds with saline pat dry Paint all foot wounds with betadine, allow to air dry. Ok to cover with foam. Reapply betadine daily.  Offload heels at all times with prevalon boots Single layer of xeroform to the great toe wound, top with dry dressing or bandaid. Change daily    Consider referral to VVS and ABIs  Re consult if needed, will not follow at this time. Thanks  Runa Whittingham [employer], RN,CWOCN, CNS, CWON-AP [phone])

## 2023-11-01 ENCOUNTER — Inpatient Hospital Stay (HOSPITAL_COMMUNITY)

## 2023-11-01 DIAGNOSIS — I509 Heart failure, unspecified: Secondary | ICD-10-CM | POA: Diagnosis not present

## 2023-11-01 LAB — CBC
HCT: 22.9 % — ABNORMAL LOW (ref 39.0–52.0)
Hemoglobin: 7.1 g/dL — ABNORMAL LOW (ref 13.0–17.0)
MCH: 34 pg (ref 26.0–34.0)
MCHC: 31 g/dL (ref 30.0–36.0)
MCV: 109.6 fL — ABNORMAL HIGH (ref 80.0–100.0)
Platelets: 239 10*3/uL (ref 150–400)
RBC: 2.09 MIL/uL — ABNORMAL LOW (ref 4.22–5.81)
RDW: 17.5 % — ABNORMAL HIGH (ref 11.5–15.5)
WBC: 9.6 10*3/uL (ref 4.0–10.5)
nRBC: 0 % (ref 0.0–0.2)

## 2023-11-01 LAB — COMPREHENSIVE METABOLIC PANEL WITH GFR
ALT: 17 U/L (ref 0–44)
AST: 31 U/L (ref 15–41)
Albumin: 2.5 g/dL — ABNORMAL LOW (ref 3.5–5.0)
Alkaline Phosphatase: 101 U/L (ref 38–126)
Anion gap: 8 (ref 5–15)
BUN: 32 mg/dL — ABNORMAL HIGH (ref 8–23)
CO2: 27 mmol/L (ref 22–32)
Calcium: 8 mg/dL — ABNORMAL LOW (ref 8.9–10.3)
Chloride: 102 mmol/L (ref 98–111)
Creatinine, Ser: 2.63 mg/dL — ABNORMAL HIGH (ref 0.61–1.24)
GFR, Estimated: 24 mL/min — ABNORMAL LOW (ref >60)
Glucose, Bld: 163 mg/dL — ABNORMAL HIGH (ref 70–99)
Potassium: 3.1 mmol/L — ABNORMAL LOW (ref 3.5–5.1)
Sodium: 137 mmol/L (ref 135–145)
Total Bilirubin: 0.8 mg/dL (ref 0.0–1.2)
Total Protein: 7.2 g/dL (ref 6.5–8.1)

## 2023-11-01 LAB — LEGIONELLA PNEUMOPHILA SEROGP 1 UR AG: L. pneumophila Serogp 1 Ur Ag: NEGATIVE

## 2023-11-01 LAB — GLUCOSE, CAPILLARY
Glucose-Capillary: 151 mg/dL — ABNORMAL HIGH (ref 70–99)
Glucose-Capillary: 180 mg/dL — ABNORMAL HIGH (ref 70–99)
Glucose-Capillary: 188 mg/dL — ABNORMAL HIGH (ref 70–99)
Glucose-Capillary: 166 mg/dL — ABNORMAL HIGH (ref 70–99)

## 2023-11-01 MED ORDER — DOXYCYCLINE HYCLATE 100 MG PO TABS
100.0000 mg | ORAL_TABLET | Freq: Two times a day (BID) | ORAL | Status: DC
  Administered 2023-11-01 – 2023-11-10 (×14): 100 mg via ORAL
  Filled 2023-11-01 (×18): qty 1

## 2023-11-01 MED ORDER — ENSURE PLUS HIGH PROTEIN PO LIQD
237.0000 mL | Freq: Two times a day (BID) | ORAL | Status: DC
  Administered 2023-11-01 – 2023-11-24 (×16): 237 mL via ORAL

## 2023-11-01 MED ORDER — POTASSIUM CHLORIDE CRYS ER 20 MEQ PO TBCR
40.0000 meq | EXTENDED_RELEASE_TABLET | Freq: Once | ORAL | Status: AC
  Administered 2023-11-01: 40 meq via ORAL
  Filled 2023-11-01: qty 2

## 2023-11-01 NOTE — Progress Notes (Signed)
 PROGRESS NOTE    David [name]  NFA:[id number] DOB: [birth year] DOA: 10/29/2023 PCP: Merl Star, MD   Brief Narrative: 78 y.o. male with medical history significant of  CAD s/p stent, Combined systolic diastolic heart failure, CKDIII, DmII, GERD, HTN  who presents to ED BIBEMS  with complaint of sob x 2 days.  ON arrival EMS placed patient on NRB and transported to ED.  Patient of note ha interim history of admission 4/18-4/23 for diagnosis of symptomatic anemia for which he was treated with 2 units of PRBC. Of note at that time anemia was deemed related to anemia of chronic disease. Currently patient states he feels improved s/p treatment in ED. He notes no fever/chills/ n/v/d/ or abdominal pain or chest pain.  Patient was found to be having a right lower lobe pneumonia and CHF exacerbation and admitted for the same.  Chest x-ray showed cardiomegaly vascular congestion and small right pleural effusion with CT of the chest shows negative for PE, shows pulmonary edema, pneumonia in the right lower lobe.  Pulmonary hypertension. Assessment & Plan:   Principal Problem:   CHF (congestive heart failure) (HCC) Active Problems:   Pneumonia due to infectious organism   Acute on Chronic combined systolic/diastolic  CHF exacerbation  with associate acute hypoxic respiratory failure -hold Lasix  tonight and check BMP tomorrow with creatinine trending up.  He appears dry.  Oxygen requirement back to baseline.  He was started on oxygen at 2 L after his hospital stay in December 2024.  At that time he refused thoracentesis.  However he had to be intubated.  Last echo from December 2024 with a EF of 45 to 40 to 45% noted. CT chest on admission showed no evidence of acute pulmonary embolism, CHF with pulmonary edema and moderate right and small left pleural effusion, superimposed pneumonia in the right lower lobe not excluded, dilated main pulmonary artery.  CAP presumed bacterial  Continue Rocephin   azithromycin .  RSV COVID and flu negative. On 4 L of oxygen saturating 99%. Encourage incentive spirometry. Continue neb treatments. Taper oxygen to baseline 2 L as tolerated and able.  Abnormal Cardiac Enzyme mildly elevated troponin at 32 -presumed due to demand Follow-up echo this admission 35 to 40% EF   Hx of Anemia of Chronic diseaes -currently stable at baseline of hgb of 8   Hypertension improved  -continue metoprolol , Bidil    CKDIIIb/stage IV-creatinine bumped to 2.06 noted on IV diuresis will follow-up in a.m. creatinine 2.63 from 2.06 from 1.97 on admission.  Will hold Lasix  tonight.   CAD s/p stent  -continue beta-blocker, aspirin  and statin. -no active issues    DMII hemoglobin A1c 6.0 on Lantus  at home CBG (last 3)  Recent Labs    11/01/23 0742 11/01/23 1209 11/01/23 1646  GLUCAP 151* 180* 188*      Chronic diabetic foot ulcer b/l  - wound care to see  - Per wound care Unstageable Pressure Injury right and left heel; left and right lateral feet'; all are 100% eschar  Arterial ulceration left medial great toe; 100% clean Pressure Injury POA: Yes Cleanse all foot wounds with saline pat dry Paint all foot wounds with betadine, allow to air dry. Ok to cover with foam. Reapply betadine daily.  Offload heels at all times with prevalon boots Single layer of xeroform to the great toe wound, top with dry dressing or bandaid. Change daily Patient was supposed to follow-up with vascular surgery after discharge however he did not follow-up with them.  Will obtain ABI    Pressure Injury 05/12/23 Coccyx Mid Stage 2 -  Partial thickness loss of dermis presenting as a shallow open injury with a red, pink wound bed without slough. (Active)  05/12/23 0400  Location: Coccyx  Location Orientation: Mid  Staging: Stage 2 -  Partial thickness loss of dermis presenting as a shallow open injury with a red, pink wound bed without slough.  Wound Description (Comments):   DO NOT  USE:  Present on Admission: Yes  Dressing Type Foam - Lift dressing to assess site every shift 11/01/23 0800     Pressure Injury 05/12/23 Heel Left Deep Tissue Pressure Injury - Purple or maroon localized area of discolored intact skin or blood-filled blister due to damage of underlying soft tissue from pressure and/or shear. (Active)  05/12/23 0400  Location: Heel  Location Orientation: Left  Staging: Deep Tissue Pressure Injury - Purple or maroon localized area of discolored intact skin or blood-filled blister due to damage of underlying soft tissue from pressure and/or shear.  Wound Description (Comments):   DO NOT USE:  Present on Admission: Yes  Dressing Type None 11/01/23 0800    Estimated body mass index is 24.87 kg/m as calculated from the following:   Height as of this encounter: 6' 2.02 (1.88 m).   Weight as of this encounter: 87.9 kg.  DVT prophylaxis: Lovenox   code Status: Full code  Family Communication: None  disposition Plan:  Status is: Inpatient Remains inpatient appropriate because: Cute illness   Consultants:  None  Procedures: None  antimicrobials: Rocephin  and azithromycin  Subjective:  He is resting in bed patient lives at home with his wife mostly bedbound or wheelchair-bound has not walked since November of last year has bilateral lower extremity multiple toe amputations he was recommended to follow-up with vascular as an outpatient which she has not done Reports he did not eat anything as his food could not be warmed up by the staff did not sleep well  Objective: Vitals:   10/31/23 1134 10/31/23 2127 11/01/23 0602 11/01/23 1209  BP: (!) 147/71 (!) 150/78 (!) 143/70 126/61  Pulse: 63 (!) 59 (!) 57 64  Resp:  18 18 19   Temp: 97.7 F (36.5 C) 98 F (36.7 C) 98.3 F (36.8 C) 97.7 F (36.5 C)  TempSrc: Oral Oral Oral Oral  SpO2: 100% 100% 100% 93%  Weight:      Height:        Intake/Output Summary (Last 24 hours) at 11/01/2023 1717 Last data  filed at 11/01/2023 1600 Gross per 24 hour  Intake 720.02 ml  Output 900 ml  Net -179.98 ml   Filed Weights   10/31/23 0500  Weight: 87.9 kg    Examination:  General exam: Appears in no acute distress  respiratory system: diminished  at The bases. Respiratory effort normal. Cardiovascular system:reg edema  Gastrointestinal system: Abdomen is nondistended, soft and nontender. No organomegaly or masses felt. Normal bowel sounds heard. Central nervous system: Alert and oriented. No focal neurological deficits. Extremities: Chronic ischemic changes in both feet amputation of multiple toes noted.  Data Reviewed: I have personally reviewed following labs and imaging studies  CBC: Recent Labs  Lab 10/29/23 1746 10/30/23 0208 10/30/23 0507 11/01/23 0026  WBC 8.8 10.0 9.7 9.6  NEUTROABS 7.0  --   --   --   HGB 8.1* 8.7* 8.4* 7.1*  HCT 26.1* 27.7* 26.4* 22.9*  MCV 109.7* 109.9* 107.8* 109.6*  PLT 237 253 249 239  Basic Metabolic Panel: Recent Labs  Lab 10/29/23 1746 10/30/23 0208 10/30/23 0507 11/01/23 0026  NA 141  --  136 137  K 3.6  --  3.7 3.1*  CL 107  --  103 102  CO2 23  --  25 27  GLUCOSE 141*  --  138* 163*  BUN 32*  --  30* 32*  CREATININE 1.97* 2.02* 2.06* 2.63*  CALCIUM  8.0*  --  8.2* 8.0*   GFR: Estimated Creatinine Clearance: 27.8 mL/min (A) (by C-G formula based on SCr of 2.63 mg/dL (H)). Liver Function Tests: Recent Labs  Lab 10/30/23 0507 11/01/23 0026  AST 39 31  ALT 17 17  ALKPHOS 121 101  BILITOT 1.3* 0.8  PROT 7.9 7.2  ALBUMIN  2.6* 2.5*   No results for input(s): LIPASE, AMYLASE in the last 168 hours. No results for input(s): AMMONIA in the last 168 hours. Coagulation Profile: No results for input(s): INR, PROTIME in the last 168 hours. Cardiac Enzymes: No results for input(s): CKTOTAL, CKMB, CKMBINDEX, TROPONINI in the last 168 hours. BNP (last 3 results) No results for input(s): PROBNP in the last 8760  hours. HbA1C: Recent Labs    10/30/23 0208  HGBA1C 6.0*   CBG: Recent Labs  Lab 10/31/23 1647 10/31/23 2122 11/01/23 0742 11/01/23 1209 11/01/23 1646  GLUCAP 200* 159* 151* 180* 188*   Lipid Profile: No results for input(s): CHOL, HDL, LDLCALC, TRIG, CHOLHDL, LDLDIRECT in the last 72 hours. Thyroid  Function Tests: No results for input(s): TSH, T4TOTAL, FREET4, T3FREE, THYROIDAB in the last 72 hours. Anemia Panel: No results for input(s): VITAMINB12, FOLATE, FERRITIN, TIBC, IRON , RETICCTPCT in the last 72 hours. Sepsis Labs: Recent Labs  Lab 10/30/23 0208  PROCALCITON <0.10    Recent Results (from the past 240 hours)  Culture, blood (routine x 2) Call MD if unable to obtain prior to antibiotics being given     Status: None (Preliminary result)   Collection Time: 10/30/23  2:06 AM   Specimen: BLOOD LEFT ARM  Result Value Ref Range Status   Specimen Description   Final    BLOOD LEFT ARM Performed at [facility], [address] [place], [place] [address]    Special Requests   Final    BOTTLES DRAWN AEROBIC AND ANAEROBIC Blood Culture results may not be optimal due to an inadequate volume of blood received in culture bottles Performed at [facility], [address], [place], [place] [address]    Culture   Final    NO GROWTH 2 DAYS Performed at [facility], [address] [place], [place] [address]    Report Status PENDING  Incomplete  Culture, blood (routine x 2) Call MD if unable to obtain prior to antibiotics being given     Status: None (Preliminary result)   Collection Time: 10/30/23  5:28 AM   Specimen: BLOOD RIGHT HAND  Result Value Ref Range Status   Specimen Description   Final    BLOOD RIGHT HAND Performed at [facility], [address] [place], [place] [address]    Special Requests   Final    BOTTLES DRAWN AEROBIC AND ANAEROBIC Blood Culture results may not be optimal due to an  inadequate volume of blood received in culture bottles Performed at [facility], [address] [place], [place] [address]    Culture   Final    NO GROWTH 2 DAYS Performed at [facility], [address] [place], [place] [address]    Report Status PENDING  Incomplete         Radiology Studies: No results found.   Scheduled Meds:  doxycycline   100 mg Oral Q12H   feeding supplement  237 mL Oral BID BM   furosemide   40 mg Intravenous Q12H   heparin   5,000 Units Subcutaneous Q8H   insulin  aspart  0-6 Units Subcutaneous TID WC   isosorbide -hydrALAZINE   2 tablet Oral TID   latanoprost   1 drop Both Eyes QHS   lubiprostone  24 mcg Oral Daily   metoprolol  succinate  100 mg Oral Daily   sertraline  25 mg Oral Daily   traZODone   100 mg Oral QHS   Continuous Infusions:  cefTRIAXone  (ROCEPHIN )  IV 2 g (10/31/23 2103)     LOS: 2 days    Time spent:35 min  Barbee Lew, MD  11/01/2023, 5:17 PM

## 2023-11-01 NOTE — Progress Notes (Signed)
 Follow the wound care order instructions to [facility] the wound care.

## 2023-11-01 NOTE — Plan of Care (Signed)
  Problem: Education: Goal: Ability to describe self-care measures that may prevent or decrease complications (Diabetes Survival Skills Education) will improve Outcome: Progressing   Problem: Metabolic: Goal: Ability to maintain appropriate glucose levels will improve Outcome: Progressing   Problem: Nutritional: Goal: Maintenance of adequate nutrition will improve Outcome: Progressing   Problem: Tissue Perfusion: Goal: Adequacy of tissue perfusion will improve Outcome: Progressing   Problem: Education: Goal: Ability to demonstrate management of disease process will improve Outcome: Progressing Goal: Ability to verbalize understanding of medication therapies will improve Outcome: Progressing   Problem: Cardiac: Goal: Ability to achieve and maintain adequate cardiopulmonary perfusion will improve Outcome: Progressing   Problem: Respiratory: Goal: Ability to maintain adequate ventilation will improve Outcome: Progressing Goal: Ability to maintain a clear airway will improve Outcome: Progressing   Problem: Clinical Measurements: Goal: Ability to maintain clinical measurements within normal limits will improve Outcome: Progressing Goal: Respiratory complications will improve Outcome: Progressing Goal: Cardiovascular complication will be avoided Outcome: Progressing

## 2023-11-02 ENCOUNTER — Inpatient Hospital Stay (HOSPITAL_COMMUNITY)

## 2023-11-02 DIAGNOSIS — I509 Heart failure, unspecified: Secondary | ICD-10-CM | POA: Diagnosis not present

## 2023-11-02 DIAGNOSIS — I7090 Unspecified atherosclerosis: Secondary | ICD-10-CM | POA: Diagnosis not present

## 2023-11-02 LAB — VAS US ABI WITH/WO TBI
Left ABI: 0.72
Right ABI: 1.24

## 2023-11-02 LAB — GLUCOSE, CAPILLARY
Glucose-Capillary: 122 mg/dL — ABNORMAL HIGH (ref 70–99)
Glucose-Capillary: 126 mg/dL — ABNORMAL HIGH (ref 70–99)
Glucose-Capillary: 159 mg/dL — ABNORMAL HIGH (ref 70–99)
Glucose-Capillary: 155 mg/dL — ABNORMAL HIGH (ref 70–99)

## 2023-11-02 LAB — BASIC METABOLIC PANEL WITH GFR
Anion gap: 7 (ref 5–15)
BUN: 37 mg/dL — ABNORMAL HIGH (ref 8–23)
CO2: 27 mmol/L (ref 22–32)
Calcium: 8.1 mg/dL — ABNORMAL LOW (ref 8.9–10.3)
Chloride: 103 mmol/L (ref 98–111)
Creatinine, Ser: 3.18 mg/dL — ABNORMAL HIGH (ref 0.61–1.24)
GFR, Estimated: 19 mL/min — ABNORMAL LOW (ref >60)
Glucose, Bld: 132 mg/dL — ABNORMAL HIGH (ref 70–99)
Potassium: 3.7 mmol/L (ref 3.5–5.1)
Sodium: 137 mmol/L (ref 135–145)

## 2023-11-02 MED ORDER — GUAIFENESIN-DM 100-10 MG/5ML PO SYRP: 5.0000 mL | ORAL_SOLUTION | ORAL | Status: DC | PRN

## 2023-11-02 MED ORDER — SODIUM CHLORIDE 0.9 % IV BOLUS: 500.0000 mL | Freq: Once | INTRAVENOUS | Status: AC

## 2023-11-02 MED ORDER — MENTHOL 3 MG MT LOZG
1.0000 | LOZENGE | OROMUCOSAL | Status: AC | PRN
  Administered 2023-11-03 – 2023-11-12 (×2): 3 mg via ORAL
  Filled 2023-11-02 (×2): qty 9

## 2023-11-02 NOTE — Plan of Care (Signed)
  Problem: Education: Goal: Ability to describe self-care measures that may prevent or decrease complications (Diabetes Survival Skills Education) will improve Outcome: Progressing Goal: Individualized Educational Video(s) Outcome: Progressing   Problem: Coping: Goal: Ability to adjust to condition or change in health will improve Outcome: Progressing   Problem: Fluid Volume: Goal: Ability to maintain a balanced intake and output will improve Outcome: Progressing   Problem: Health Behavior/Discharge Planning: Goal: Ability to identify and utilize available resources and services will improve Outcome: Progressing Goal: Ability to manage health-related needs will improve Outcome: Progressing   Problem: Metabolic: Goal: Ability to maintain appropriate glucose levels will improve Outcome: Progressing   Problem: Nutritional: Goal: Maintenance of adequate nutrition will improve Outcome: Progressing Goal: Progress toward achieving an optimal weight will improve Outcome: Progressing   Problem: Skin Integrity: Goal: Risk for impaired skin integrity will decrease Outcome: Progressing   Problem: Tissue Perfusion: Goal: Adequacy of tissue perfusion will improve Outcome: Progressing   Problem: Education: Goal: Ability to demonstrate management of disease process will improve Outcome: Progressing Goal: Ability to verbalize understanding of medication therapies will improve Outcome: Progressing Goal: Individualized Educational Video(s) Outcome: Progressing   Problem: Activity: Goal: Capacity to carry out activities will improve Outcome: Progressing   Problem: Cardiac: Goal: Ability to achieve and maintain adequate cardiopulmonary perfusion will improve Outcome: Progressing   Problem: Activity: Goal: Ability to tolerate increased activity will improve Outcome: Progressing   Problem: Clinical Measurements: Goal: Ability to maintain a body temperature in the normal range will  improve Outcome: Progressing   Problem: Respiratory: Goal: Ability to maintain adequate ventilation will improve Outcome: Progressing Goal: Ability to maintain a clear airway will improve Outcome: Progressing   Problem: Education: Goal: Knowledge of General Education information will improve Description: Including pain rating scale, medication(s)/side effects and non-pharmacologic comfort measures Outcome: Progressing   Problem: Health Behavior/Discharge Planning: Goal: Ability to manage health-related needs will improve Outcome: Progressing   Problem: Clinical Measurements: Goal: Ability to maintain clinical measurements within normal limits will improve Outcome: Progressing Goal: Will remain free from infection Outcome: Progressing Goal: Diagnostic test results will improve Outcome: Progressing Goal: Respiratory complications will improve Outcome: Progressing Goal: Cardiovascular complication will be avoided Outcome: Progressing   Problem: Activity: Goal: Risk for activity intolerance will decrease Outcome: Progressing   Problem: Nutrition: Goal: Adequate nutrition will be maintained Outcome: Progressing   Problem: Coping: Goal: Level of anxiety will decrease Outcome: Progressing   Problem: Elimination: Goal: Will not experience complications related to bowel motility Outcome: Progressing Goal: Will not experience complications related to urinary retention Outcome: Progressing   Problem: Pain Managment: Goal: General experience of comfort will improve and/or be controlled Outcome: Progressing   Problem: Safety: Goal: Ability to remain free from injury will improve Outcome: Progressing   Problem: Skin Integrity: Goal: Risk for impaired skin integrity will decrease Outcome: Progressing

## 2023-11-02 NOTE — Progress Notes (Addendum)
 PROGRESS NOTE    David [name]  WJX:[id number] DOB: January 19, 1946 DOA: 10/29/2023 PCP: Merl Star, MD   Brief Narrative: 78 y.o. male with medical history significant of  CAD s/p stent, Combined systolic diastolic heart failure, CKDIII, DmII, GERD, HTN  who presents to ED BIBEMS  with complaint of sob x 2 days.  ON arrival EMS placed patient on NRB and transported to ED. patient was admitted to the hospital in April for symptomatic anemia transferred 2 units of blood.  It was deemed to be related to anemia of chronic disease.  Patient was admitted for the treatment of CHF and pneumonia. Chest x-ray showed cardiomegaly vascular congestion and small right pleural effusion with CT of the chest shows negative for PE, shows pulmonary edema, pneumonia in the right lower lobe.  Pulmonary hypertension.  Assessment & Plan:   Principal Problem:   CHF (congestive heart failure) (HCC) Active Problems:   Pneumonia due to infectious organism   Acute on Chronic combined systolic/diastolic  CHF exacerbation  with associate acute hypoxic respiratory failure -Lasix  has been on hold due to creatinine trending up.  In fact he looks a little on the dry side today.  Will give him slow IV fluids for 500 cc and continue to hold the Lasix  and recheck renal functions tomorrow.  Oxygen requirement back to baseline.  He was started on oxygen at 2 L after his hospital stay in December 2024.  At that time he refused thoracentesis.  However he had to be intubated.  Last echo from December 2024 with a EF of 45 to 40 to 45% noted. CT chest on admission showed no evidence of acute pulmonary embolism, CHF with pulmonary edema and moderate right and small left pleural effusion, superimposed pneumonia in the right lower lobe not excluded, dilated main pulmonary artery.  CAP presumed bacterial  Continue Rocephin  azithromycin .  RSV COVID and flu negative. On 4 L of oxygen saturating 99%. Encourage incentive  spirometry. Continue neb treatments. Taper oxygen to baseline 2 L as tolerated and able.  Abnormal Cardiac Enzyme mildly elevated troponin at 32 -presumed due to demand Follow-up echo this admission 35 to 40% EF   Hx of Anemia of Chronic diseaes -currently stable at baseline of hgb of 8   Hypertension improved  -continue metoprolol , Bidil    AKI On ckd stage IV-creatinine bumped to 2.63 from 2.06 from 1.97  Will hold Lasix  and give gentle IV fluids 500 cc and follow-up in a.m.   CAD s/p stent  -continue beta-blocker, aspirin  and statin. -no active issues    DMII hemoglobin A1c 6.0 on Lantus  at home CBG (last 3)  Recent Labs    11/01/23 2014 11/02/23 0746 11/02/23 1114  GLUCAP 166* 122* 126*      Chronic diabetic foot ulcer b/l  - Per wound care Unstageable Pressure Injury right and left heel; left and right lateral feet'; all are 100% eschar  Arterial ulceration left medial great toe; 100% clean Pressure Injury POA: Yes Cleanse all foot wounds with saline pat dry Paint all foot wounds with betadine, allow to air dry. Ok to cover with foam. Reapply betadine daily.  Offload heels at all times with prevalon boots Single layer of xeroform to the great toe wound, top with dry dressing or bandaid. Change daily Patient was supposed to follow-up with vascular surgery after discharge however he did not follow-up with them. Will obtain ABI normal on the right and moderate left lower extremity arterial disease.  He was due  to have an appointment with vascular as an outpatient but he did not follow-up on the appointment.    Pressure Injury 05/12/23 Coccyx Mid Stage 2 -  Partial thickness loss of dermis presenting as a shallow open injury with a red, pink wound bed without slough. (Active)  05/12/23 0400  Location: Coccyx  Location Orientation: Mid  Staging: Stage 2 -  Partial thickness loss of dermis presenting as a shallow open injury with a red, pink wound bed without slough.   Wound Description (Comments):   DO NOT USE:  Present on Admission: Yes  Dressing Type Foam - Lift dressing to assess site every shift 11/02/23 0800     Pressure Injury 05/12/23 Heel Left Deep Tissue Pressure Injury - Purple or maroon localized area of discolored intact skin or blood-filled blister due to damage of underlying soft tissue from pressure and/or shear. (Active)  05/12/23 0400  Location: Heel  Location Orientation: Left  Staging: Deep Tissue Pressure Injury - Purple or maroon localized area of discolored intact skin or blood-filled blister due to damage of underlying soft tissue from pressure and/or shear.  Wound Description (Comments):   DO NOT USE:  Present on Admission: Yes  Dressing Type None 11/02/23 0800    Estimated body mass index is 24.64 kg/m as calculated from the following:   Height as of this encounter: 6' 2.02 (1.88 m).   Weight as of this encounter: 87.1 kg.  DVT prophylaxis: Lovenox   code Status: Full code  Family Communication: None  disposition Plan:  Status is: Inpatient Remains inpatient appropriate because: Cute illness   Consultants:  None  Procedures: None  antimicrobials: Rocephin  and azithromycin  Subjective:  He is resting in bed wife at bedside Complains of a sore throat feels breathing is better no chest pain Objective: Vitals:   11/01/23 2004 11/02/23 0438 11/02/23 0500 11/02/23 1107  BP: (!) 159/73 (!) 143/68  (!) 141/67  Pulse: 65 (!) 58  (!) 58  Resp: 15 15  19   Temp: 98.6 F (37 C) 98.3 F (36.8 C)  98.4 F (36.9 C)  TempSrc: Oral   Oral  SpO2: 99% 100%  100%  Weight:   87.1 kg   Height:        Intake/Output Summary (Last 24 hours) at 11/02/2023 1426 Last data filed at 11/02/2023 1100 Gross per 24 hour  Intake 300 ml  Output 900 ml  Net -600 ml   Filed Weights   10/31/23 0500 11/02/23 0500  Weight: 87.9 kg 87.1 kg    Examination:  General exam: Appears in no acute distress  respiratory system: diminished  at  The bases. Respiratory effort normal. Cardiovascular system:reg edema  Gastrointestinal system: Abdomen is nondistended, soft and nontender. No organomegaly or masses felt. Normal bowel sounds heard. Central nervous system: Alert and oriented. No focal neurological deficits. Extremities: Chronic ischemic changes in both feet amputation of multiple toes noted.  Data Reviewed: I have personally reviewed following labs and imaging studies  CBC: Recent Labs  Lab 10/29/23 1746 10/30/23 0208 10/30/23 0507 11/01/23 0026  WBC 8.8 10.0 9.7 9.6  NEUTROABS 7.0  --   --   --   HGB 8.1* 8.7* 8.4* 7.1*  HCT 26.1* 27.7* 26.4* 22.9*  MCV 109.7* 109.9* 107.8* 109.6*  PLT 237 253 249 239   Basic Metabolic Panel: Recent Labs  Lab 10/29/23 1746 10/30/23 0208 10/30/23 0507 11/01/23 0026 11/02/23 0342  NA 141  --  136 137 137  K 3.6  --  3.7 3.1* 3.7  CL 107  --  103 102 103  CO2 23  --  25 27 27   GLUCOSE 141*  --  138* 163* 132*  BUN 32*  --  30* 32* 37*  CREATININE 1.97* 2.02* 2.06* 2.63* 3.18*  CALCIUM  8.0*  --  8.2* 8.0* 8.1*   GFR: Estimated Creatinine Clearance: 23 mL/min (A) (by C-G formula based on SCr of 3.18 mg/dL (H)). Liver Function Tests: Recent Labs  Lab 10/30/23 0507 11/01/23 0026  AST 39 31  ALT 17 17  ALKPHOS 121 101  BILITOT 1.3* 0.8  PROT 7.9 7.2  ALBUMIN  2.6* 2.5*   No results for input(s): LIPASE, AMYLASE in the last 168 hours. No results for input(s): AMMONIA in the last 168 hours. Coagulation Profile: No results for input(s): INR, PROTIME in the last 168 hours. Cardiac Enzymes: No results for input(s): CKTOTAL, CKMB, CKMBINDEX, TROPONINI in the last 168 hours. BNP (last 3 results) No results for input(s): PROBNP in the last 8760 hours. HbA1C: No results for input(s): HGBA1C in the last 72 hours.  CBG: Recent Labs  Lab 11/01/23 1209 11/01/23 1646 11/01/23 2014 11/02/23 0746 11/02/23 1114  GLUCAP 180* 188* 166* 122* 126*    Lipid Profile: No results for input(s): CHOL, HDL, LDLCALC, TRIG, CHOLHDL, LDLDIRECT in the last 72 hours. Thyroid  Function Tests: No results for input(s): TSH, T4TOTAL, FREET4, T3FREE, THYROIDAB in the last 72 hours. Anemia Panel: No results for input(s): VITAMINB12, FOLATE, FERRITIN, TIBC, IRON , RETICCTPCT in the last 72 hours. Sepsis Labs: Recent Labs  Lab 10/30/23 0208  PROCALCITON <0.10    Recent Results (from the past 240 hours)  Culture, blood (routine x 2) Call MD if unable to obtain prior to antibiotics being given     Status: None (Preliminary result)   Collection Time: 10/30/23  2:06 AM   Specimen: BLOOD LEFT ARM  Result Value Ref Range Status   Specimen Description   Final    BLOOD LEFT ARM Performed at [facility], [address] [place], [place] [address]    Special Requests   Final    BOTTLES DRAWN AEROBIC AND ANAEROBIC Blood Culture results may not be optimal due to an inadequate volume of blood received in culture bottles Performed at [facility], [address], [place], [place] [address]    Culture   Final    NO GROWTH 3 DAYS Performed at [facility], [address], [place], [place] [address]    Report Status PENDING  Incomplete  Culture, blood (routine x 2) Call MD if unable to obtain prior to antibiotics being given     Status: None (Preliminary result)   Collection Time: 10/30/23  5:28 AM   Specimen: BLOOD RIGHT HAND  Result Value Ref Range Status   Specimen Description   Final    BLOOD RIGHT HAND Performed at [facility], [address] [place], [place] [address]    Special Requests   Final    BOTTLES DRAWN AEROBIC AND ANAEROBIC Blood Culture results may not be optimal due to an inadequate volume of blood received in culture bottles Performed at [facility], [address] [place], [place] [address]    Culture   Final    NO GROWTH 3 DAYS Performed  at [facility], [address], [place], [place] [address]    Report Status PENDING  Incomplete         Radiology Studies:  VAS US  ABI WITH/WO TBI Result Date: 11/02/2023  LOWER EXTREMITY DOPPLER STUDY Patient Name:  BRANSON KRANZ  Date of Exam:   11/02/2023 Medical Rec #: [id number]         Accession #:    [id number] Date of Birth: [birth year]        Patient Gender: M Patient Age:   59 years Exam Location:  [facility] Procedure:      VAS US  ABI WITH/WO TBI Referring Phys: --------------------------------------------------------------------------------  Indications: Ulcerations to bilateral heels, right lateral foot, left outer              foot. High Risk Factors: Hypertension, hyperlipidemia, Diabetes, past history of                    smoking, coronary artery disease. Other Factors: CHF, CKD III.  Vascular Interventions: Status post Right great toe (2018) and 2nd toe                         amputations (2020), left 5th ray amputation (2022). Limitations: Today's exam was limited due to bandages. Comparison Study: Prior ABI done 04/25/2023 Performing Technologist: Carleene Chase RVS  Examination Guidelines: A complete evaluation includes at minimum, Doppler waveform signals and systolic blood pressure reading at the level of bilateral brachial, anterior tibial, and posterior tibial arteries, when vessel segments are accessible. Bilateral testing is considered an integral part of a complete examination. Photoelectric Plethysmograph (PPG) waveforms and toe systolic pressure readings are included as required and additional duplex testing as needed. Limited examinations for reoccurring indications may be performed as noted.  ABI Findings: +---------+------------------+-----+---------+----------+ Right    Rt Pressure (mmHg)IndexWaveform Comment    +---------+------------------+-----+---------+----------+ Brachial 147                    triphasic            +---------+------------------+-----+---------+----------+ PTA      182               1.24 biphasic            +---------+------------------+-----+---------+----------+ DP       176               1.20 biphasic            +---------+------------------+-----+---------+----------+ Great Toe                                amputation +---------+------------------+-----+---------+----------+ +---------+------------------+-----+---------+--------+ Left     Lt Pressure (mmHg)IndexWaveform Comment  +---------+------------------+-----+---------+--------+ Brachial 145                    triphasic         +---------+------------------+-----+---------+--------+ PTA      106               0.72 biphasic          +---------+------------------+-----+---------+--------+ DP       92                0.63 biphasic          +---------+------------------+-----+---------+--------+ Great Toe                       dampened bandages +---------+------------------+-----+---------+--------+ +-------+-----------+-----------------+------------+------------+ ABI/TBIToday's ABIToday's TBI      Previous ABIPrevious TBI +-------+-----------+-----------------+------------+------------+ Right  1.24       amputation  1.08        amputation   +-------+-----------+-----------------+------------+------------+ Left   0.72       dampened waveform0.63        0.87         +-------+-----------+-----------------+------------+------------+ Right ABIs appear increased compared to prior study on 04/25/2023. Left ABIs appear essentially unchanged compared to prior study on 04/25/2023.  Summary: Right: Resting right ankle-brachial index is within normal range. Left: Resting left ankle-brachial index indicates moderate left lower extremity arterial disease. *See table(s) above for measurements and observations.  Electronically signed by Genny Kid MD on 11/02/2023 at 11:54:32 AM.    Final    DG  Chest 1 View Result Date: 11/01/2023 CLINICAL DATA:  Hypoxia, shortness of breath EXAM: CHEST  1 VIEW COMPARISON:  CT chest dated 10/29/2023 FINDINGS: Mild interstitial edema. Small right pleural effusion. Associated bilateral lower lobe opacities, likely atelectasis. Mild cardiomegaly. No pneumothorax. IMPRESSION: Mild interstitial edema. Small right pleural effusion. Electronically Signed   By: Zadie Herter M.D.   On: 11/01/2023 20:45    Scheduled Meds:  doxycycline   100 mg Oral Q12H   feeding supplement  237 mL Oral BID BM   heparin   5,000 Units Subcutaneous Q8H   insulin  aspart  0-6 Units Subcutaneous TID WC   isosorbide -hydrALAZINE   2 tablet Oral TID   latanoprost   1 drop Both Eyes QHS   lubiprostone  24 mcg Oral Daily   sertraline  25 mg Oral Daily   traZODone   100 mg Oral QHS   Continuous Infusions:  cefTRIAXone  (ROCEPHIN )  IV Stopped (11/01/23 2237)     LOS: 3 days    Time spent:35 min  Barbee Lew, MD  11/02/2023, 2:26 PM

## 2023-11-02 NOTE — Progress Notes (Signed)
 VASCULAR LAB    ABI has been performed.  See CV proc for preliminary results.   Rushawn Capshaw, RVT 11/02/2023, 10:48 AM

## 2023-11-02 NOTE — Plan of Care (Signed)

## 2023-11-03 DIAGNOSIS — J189 Pneumonia, unspecified organism: Secondary | ICD-10-CM | POA: Diagnosis not present

## 2023-11-03 DIAGNOSIS — I5043 Acute on chronic combined systolic (congestive) and diastolic (congestive) heart failure: Secondary | ICD-10-CM | POA: Diagnosis not present

## 2023-11-03 LAB — BASIC METABOLIC PANEL WITH GFR
Anion gap: 9 (ref 5–15)
BUN: 40 mg/dL — ABNORMAL HIGH (ref 8–23)
CO2: 25 mmol/L (ref 22–32)
Calcium: 7.9 mg/dL — ABNORMAL LOW (ref 8.9–10.3)
Chloride: 104 mmol/L (ref 98–111)
Creatinine, Ser: 3.42 mg/dL — ABNORMAL HIGH (ref 0.61–1.24)
GFR, Estimated: 18 mL/min — ABNORMAL LOW (ref >60)
Glucose, Bld: 123 mg/dL — ABNORMAL HIGH (ref 70–99)
Potassium: 3.9 mmol/L (ref 3.5–5.1)
Sodium: 138 mmol/L (ref 135–145)

## 2023-11-03 LAB — GLUCOSE, CAPILLARY
Glucose-Capillary: 138 mg/dL — ABNORMAL HIGH (ref 70–99)
Glucose-Capillary: 137 mg/dL — ABNORMAL HIGH (ref 70–99)
Glucose-Capillary: 125 mg/dL — ABNORMAL HIGH (ref 70–99)

## 2023-11-03 NOTE — TOC CM/SW Note (Signed)
 Transition of Care [facility]) - Inpatient Brief Assessment   Patient Details  Name: David [name] MRN: [id number] Date of Birth: [birth year]  Transition of [facility]) CM/SW Contact:    Gertha Ku, LCSW Phone Number: 11/03/2023, 3:20 PM   Clinical Narrative:   Transition of Care Department [facility]) has reviewed patient and no TOC needs have been identified at this time. We will continue to monitor patient advancement through interdisciplinary progression rounds. If new patient transition needs arise, please place a TOC consult.  Transition of Care Asessment: Insurance and Status: Insurance coverage has been reviewed Patient has primary care physician: Yes Home environment has been reviewed: home with spouse Prior level of function:: mod independent   Social Drivers of Health Review: SDOH reviewed no interventions necessary Readmission risk has been reviewed: Yes Transition of care needs: no transition of care needs at this time

## 2023-11-03 NOTE — Progress Notes (Signed)
 Patient refused CBG check this morning, insulin  held, RN educated patient about monitoring blood sugars.

## 2023-11-03 NOTE — Plan of Care (Signed)
  Problem: Activity: Goal: Capacity to carry out activities will improve Outcome: Not Progressing   

## 2023-11-03 NOTE — Progress Notes (Signed)
 [facility]  PROGRESS NOTE  David [name] JYN:[id number] DOB: Aug 25, 1945 DOA: 10/29/2023 PCP: Merl Star, MD   Brief HPI:   78 y.o. male with medical history significant of  CAD s/p stent, Combined systolic diastolic heart failure, CKDIII, DmII, GERD, HTN  who presents to ED BIBEMS  with complaint of sob x 2 days.  ON arrival EMS placed patient on NRB and transported to ED. patient was admitted to the hospital in April for symptomatic anemia transferred 2 units of blood.  It was deemed to be related to anemia of chronic disease.  Patient was admitted for the treatment of CHF and pneumonia. Chest x-ray showed cardiomegaly vascular congestion and small right pleural effusion with CT of the chest shows negative for PE, shows pulmonary edema, pneumonia in the right lower lobe.  Pulmonary hypertension.    Assessment/Plan:   Acute on Chronic combined systolic/diastolic  CHF exacerbation  with associate acute hypoxic respiratory failure -Lasix  has been on hold due to creatinine trending up.   - Was started on Lasix  yesterday, creatinine up to 3.42.  Will hold further doses of Lasix .  Last echo from December 2024 with a EF of 45 to 40 to 45% noted. CT chest on admission showed no evidence of acute pulmonary embolism, CHF with pulmonary edema and moderate right and small left pleural effusion, superimposed pneumonia in the right lower lobe not excluded, dilated main pulmonary artery.   CAP presumed bacterial  Continue Rocephin  azithromycin .  RSV COVID and flu negative. On 4 L of oxygen saturating 99%. Encourage incentive spirometry. Continue neb treatments. Taper oxygen to baseline 2 L as tolerated and able.   Abnormal Cardiac Enzyme mildly elevated troponin at 32 -presumed due to demand Follow-up echo this admission 35 to 40% EF   Hx of Anemia of Chronic diseaes -currently stable at baseline of hgb of 8   Hypertension improved  -continue metoprolol , Bidil     AKI On ckd stage  IV-creatinine bumped to 3.42  from 2.06 from 1.97   Will hold Lasix   Follow BMP in am    CAD s/p stent  -continue beta-blocker, aspirin  and statin. -no active issues    DMII hemoglobin A1c 6.0 on Lantus  at home       Chronic diabetic foot ulcer b/l  - Per wound care Unstageable Pressure Injury right and left heel; left and right lateral feet'; all are 100% eschar  Arterial ulceration left medial great toe; 100% clean Pressure Injury POA: Yes Cleanse all foot wounds with saline pat dry Paint all foot wounds with betadine, allow to air dry. Ok to cover with foam. Reapply betadine daily.  Offload heels at all times with prevalon boots Single layer of xeroform to the great toe wound, top with dry dressing or bandaid. Change daily Patient was supposed to follow-up with vascular surgery after discharge however he did not follow-up with them. Will obtain ABI normal on the right and moderate left lower extremity arterial disease.  He was due to have an appointment with vascular as an outpatient but he did not follow-up on the appointment.         Pressure Injury 05/12/23 Coccyx Mid Stage 2 -  Partial thickness loss of dermis presenting as a shallow open injury with a red, pink wound bed without slough. (Active)  05/12/23 0400  Location: Coccyx  Location Orientation: Mid  Staging: Stage 2 -  Partial thickness loss of dermis presenting as a shallow open injury with a red, pink wound bed  without slough.  Wound Description (Comments):   DO NOT USE:  Present on Admission: Yes  Dressing Type Foam - Lift dressing to assess site every shift 11/02/23 0800     Pressure Injury 05/12/23 Heel Left Deep Tissue Pressure Injury - Purple or maroon localized area of discolored intact skin or blood-filled blister due to damage of underlying soft tissue from pressure and/or shear. (Active)  05/12/23 0400  Location: Heel  Location Orientation: Left  Staging: Deep Tissue Pressure Injury - Purple or maroon  localized area of discolored intact skin or blood-filled blister due to damage of underlying soft tissue from pressure and/or shear.  Wound Description (Comments):   DO NOT USE:  Present on Admission: Yes  Dressing Type None 11/02/23 0800      Estimated body mass index is 24.64 kg/m as calculated from the following:   Height as of this encounter: 6' 2.02 (1.88 m).   Weight as of this encounter: 87.1 kg.      Medications     doxycycline   100 mg Oral Q12H   feeding supplement  237 mL Oral BID BM   heparin   5,000 Units Subcutaneous Q8H   insulin  aspart  0-6 Units Subcutaneous TID WC   isosorbide -hydrALAZINE   2 tablet Oral TID   latanoprost   1 drop Both Eyes QHS   lubiprostone  24 mcg Oral Daily   sertraline  25 mg Oral Daily   traZODone   100 mg Oral QHS     Data Reviewed:   CBG:  Recent Labs  Lab 11/02/23 1114 11/02/23 1646 11/02/23 2031 11/03/23 1219 11/03/23 1655  GLUCAP 126* 159* 155* 138* 137*    SpO2: 98 % O2 Flow Rate (L/min): 3 L/min    Vitals:   11/02/23 1945 11/03/23 0451 11/03/23 0500 11/03/23 1338  BP: 133/67 138/64  133/66  Pulse: 63 62  75  Resp: 18 18  16   Temp: 98.6 F (37 C) 98 F (36.7 C)  98.2 F (36.8 C)  TempSrc: Oral Oral  Axillary  SpO2: 99% 100%  98%  Weight:   88 kg   Height:          Data Reviewed:  Basic Metabolic Panel: Recent Labs  Lab 10/29/23 1746 10/30/23 0208 10/30/23 0507 11/01/23 0026 11/02/23 0342 11/03/23 0440  NA 141  --  136 137 137 138  K 3.6  --  3.7 3.1* 3.7 3.9  CL 107  --  103 102 103 104  CO2 23  --  25 27 27 25   GLUCOSE 141*  --  138* 163* 132* 123*  BUN 32*  --  30* 32* 37* 40*  CREATININE 1.97* 2.02* 2.06* 2.63* 3.18* 3.42*  CALCIUM  8.0*  --  8.2* 8.0* 8.1* 7.9*    CBC: Recent Labs  Lab 10/29/23 1746 10/30/23 0208 10/30/23 0507 11/01/23 0026  WBC 8.8 10.0 9.7 9.6  NEUTROABS 7.0  --   --   --   HGB 8.1* 8.7* 8.4* 7.1*  HCT 26.1* 27.7* 26.4* 22.9*  MCV 109.7* 109.9* 107.8* 109.6*   PLT 237 253 249 239    LFT Recent Labs  Lab 10/30/23 0507 11/01/23 0026  AST 39 31  ALT 17 17  ALKPHOS 121 101  BILITOT 1.3* 0.8  PROT 7.9 7.2  ALBUMIN  2.6* 2.5*     Antibiotics: Anti-infectives (From admission, onward)    Start     Dose/Rate Route Frequency Ordered Stop   11/01/23 2200  doxycycline  (VIBRA -TABS) tablet 100 mg  100 mg Oral Every 12 hours 11/01/23 0838     10/30/23 2200  cefTRIAXone  (ROCEPHIN ) 2 g in sodium chloride  0.9 % 100 mL IVPB        2 g 200 mL/hr over 30 Minutes Intravenous Every 24 hours 10/30/23 0007 11/04/23 2159   10/30/23 1000  doxycycline  (VIBRAMYCIN ) 100 mg in sodium chloride  0.9 % 250 mL IVPB  Status:  Discontinued        100 mg 125 mL/hr over 120 Minutes Intravenous Every 12 hours 10/30/23 0007 11/01/23 0838   10/29/23 2130  cefTRIAXone  (ROCEPHIN ) 1 g in sodium chloride  0.9 % 100 mL IVPB        1 g 200 mL/hr over 30 Minutes Intravenous  Once 10/29/23 2124 10/29/23 2214   10/29/23 2130  azithromycin  (ZITHROMAX ) 500 mg in sodium chloride  0.9 % 250 mL IVPB        500 mg 250 mL/hr over 60 Minutes Intravenous  Once 10/29/23 2124 10/30/23 0012        DVT prophylaxis: Heparin   Code Status: Full code  Family Communication: No family at bedside   CONSULTS    Subjective   Feels fatigued this morning   Objective    Physical Examination:  General-appears in no acute distress Heart-S1-S2, regular, no murmur auscultated Lungs-decreased breath sounds at lung bases Abdomen-soft, nontender, no organomegaly Extremities-no edema in the lower extremities Neuro-alert, oriented x3, no focal deficit noted   Status is: Inpatient:      Pressure Injury 05/12/23 Coccyx Mid Stage 2 -  Partial thickness loss of dermis presenting as a shallow open injury with a red, pink wound bed without slough. (Active)  05/12/23 0400  Location: Coccyx  Location Orientation: Mid  Staging: Stage 2 -  Partial thickness loss of dermis presenting as a  shallow open injury with a red, pink wound bed without slough.  Wound Description (Comments):   DO NOT USE:  Present on Admission: Yes     Pressure Injury 05/12/23 Heel Left Deep Tissue Pressure Injury - Purple or maroon localized area of discolored intact skin or blood-filled blister due to damage of underlying soft tissue from pressure and/or shear. (Active)  05/12/23 0400  Location: Heel  Location Orientation: Left  Staging: Deep Tissue Pressure Injury - Purple or maroon localized area of discolored intact skin or blood-filled blister due to damage of underlying soft tissue from pressure and/or shear.  Wound Description (Comments):   DO NOT USE:  Present on Admission: Yes        Marasia Newhall S Jodilyn Giese   [facility] If 7PM-7AM, please contact night-coverage at [email or web address], Office  [phone]   11/03/2023, 6:32 PM  LOS: 4 days

## 2023-11-03 NOTE — Plan of Care (Signed)

## 2023-11-04 DIAGNOSIS — I509 Heart failure, unspecified: Secondary | ICD-10-CM | POA: Diagnosis not present

## 2023-11-04 DIAGNOSIS — J189 Pneumonia, unspecified organism: Secondary | ICD-10-CM | POA: Diagnosis not present

## 2023-11-04 DIAGNOSIS — I5043 Acute on chronic combined systolic (congestive) and diastolic (congestive) heart failure: Secondary | ICD-10-CM | POA: Diagnosis not present

## 2023-11-04 LAB — RETICULOCYTES
Immature Retic Fract: 15.7 % (ref 2.3–15.9)
RBC.: 2.28 MIL/uL — ABNORMAL LOW (ref 4.22–5.81)
Retic Count, Absolute: 57.7 10*3/uL (ref 19.0–186.0)
Retic Ct Pct: 2.5 % (ref 0.4–3.1)

## 2023-11-04 LAB — HEMOGLOBIN AND HEMATOCRIT, BLOOD
HCT: 24.5 % — ABNORMAL LOW (ref 39.0–52.0)
Hemoglobin: 7.6 g/dL — ABNORMAL LOW (ref 13.0–17.0)

## 2023-11-04 LAB — IRON AND TIBC
Iron: 93 ug/dL (ref 45–182)
Saturation Ratios: 64 % — ABNORMAL HIGH (ref 17.9–39.5)
TIBC: 146 ug/dL — ABNORMAL LOW (ref 250–450)
UIBC: 53 ug/dL

## 2023-11-04 LAB — CULTURE, BLOOD (ROUTINE X 2): Culture: NO GROWTH

## 2023-11-04 LAB — COMPREHENSIVE METABOLIC PANEL WITH GFR
ALT: 12 U/L (ref 0–44)
AST: 23 U/L (ref 15–41)
Albumin: 2.3 g/dL — ABNORMAL LOW (ref 3.5–5.0)
Alkaline Phosphatase: 80 U/L (ref 38–126)
Anion gap: 8 (ref 5–15)
BUN: 44 mg/dL — ABNORMAL HIGH (ref 8–23)
CO2: 25 mmol/L (ref 22–32)
Calcium: 8.1 mg/dL — ABNORMAL LOW (ref 8.9–10.3)
Chloride: 103 mmol/L (ref 98–111)
Creatinine, Ser: 3.58 mg/dL — ABNORMAL HIGH (ref 0.61–1.24)
GFR, Estimated: 17 mL/min — ABNORMAL LOW (ref >60)
Glucose, Bld: 113 mg/dL — ABNORMAL HIGH (ref 70–99)
Potassium: 3.8 mmol/L (ref 3.5–5.1)
Sodium: 136 mmol/L (ref 135–145)
Total Bilirubin: 0.8 mg/dL (ref 0.0–1.2)
Total Protein: 6.6 g/dL (ref 6.5–8.1)

## 2023-11-04 LAB — CBC
HCT: 20 % — ABNORMAL LOW (ref 39.0–52.0)
Hemoglobin: 6 g/dL — CL (ref 13.0–17.0)
MCH: 34.3 pg — ABNORMAL HIGH (ref 26.0–34.0)
MCHC: 30 g/dL (ref 30.0–36.0)
MCV: 114.3 fL — ABNORMAL HIGH (ref 80.0–100.0)
Platelets: 210 10*3/uL (ref 150–400)
RBC: 1.75 MIL/uL — ABNORMAL LOW (ref 4.22–5.81)
RDW: 17.8 % — ABNORMAL HIGH (ref 11.5–15.5)
WBC: 7.4 10*3/uL (ref 4.0–10.5)
nRBC: 0 % (ref 0.0–0.2)

## 2023-11-04 LAB — FERRITIN: Ferritin: 288 ng/mL (ref 24–336)

## 2023-11-04 LAB — GLUCOSE, CAPILLARY
Glucose-Capillary: 110 mg/dL — ABNORMAL HIGH (ref 70–99)
Glucose-Capillary: 166 mg/dL — ABNORMAL HIGH (ref 70–99)
Glucose-Capillary: 153 mg/dL — ABNORMAL HIGH (ref 70–99)
Glucose-Capillary: 176 mg/dL — ABNORMAL HIGH (ref 70–99)

## 2023-11-04 LAB — FOLATE: Folate: 10.5 ng/mL (ref >5.9)

## 2023-11-04 LAB — PREPARE RBC (CROSSMATCH)

## 2023-11-04 LAB — VITAMIN B12: Vitamin B-12: 834 pg/mL (ref 180–914)

## 2023-11-04 MED ORDER — BISACODYL 10 MG RE SUPP
10.0000 mg | Freq: Once | RECTAL | Status: DC
  Filled 2023-11-04: qty 1

## 2023-11-04 MED ORDER — SODIUM CHLORIDE 0.9% IV SOLUTION: Freq: Once | INTRAVENOUS | Status: AC

## 2023-11-04 MED ORDER — DOCUSATE SODIUM 100 MG PO CAPS
100.0000 mg | ORAL_CAPSULE | Freq: Every day | ORAL | Status: DC
  Administered 2023-11-11 – 2023-11-24 (×8): 100 mg via ORAL
  Filled 2023-11-04 (×21): qty 1

## 2023-11-04 NOTE — Consult Note (Signed)
 [facility] Consultation Note  Referring Provider: [facility] Primary Care Physician:  Merl Star, MD Primary Gastroenterologist:  [facility] (unassigned)  Reason for Consultation:  anemia  HPI: David [name] is a 78 y.o. male admitted shortness of breath with pneumonia.  Found also to have anemia.  Has chronic anemia for at least past two years.  Last colonoscopy Dr. Andriette Keeling 2019 showed 1 cm pedunculated descending colon polyp (removed, unclear pathology) as well as moderate internal hemorrhoids.  No dysphagia, GERD, change in bowel habits, blood in stool, weight loss.  Some lower abdominal cramps.   Past Medical History:  Diagnosis Date   Anemia    Arthritis    CAD in native artery    s/p stent in 11/18   CHF (congestive heart failure) (HCC)    normal echo in 11/18   CKD (chronic kidney disease) stage 3, GFR 30-59 ml/min (HCC)    DDD (degenerative disc disease), cervical    Diabetes mellitus with complication (HCC)    Type II   Diabetic neuropathy (HCC)    Diabetic retinopathy (HCC)    GERD (gastroesophageal reflux disease)    Glaucoma    Hypertension     Past Surgical History:  Procedure Laterality Date   AMPUTATION Right 07/09/2016   Procedure: Right Great Toe Amputation at Metatarsophalangeal Joint;  Surgeon: Timothy Ford, MD;  Location: [facility] OR;  Service: Orthopedics;  Laterality: Right;   AMPUTATION Right 05/28/2018   Procedure: RIGHT SECOND TOE AMPUTATION;  Surgeon: Timothy Ford, MD;  Location: [facility] OR;  Service: Orthopedics;  Laterality: Right;   AMPUTATION Left 08/03/2020   Procedure: LEFT 5TH RAY AMPUTATION;  Surgeon: Timothy Ford, MD;  Location: [facility] OR;  Service: Orthopedics;  Laterality: Left;   BACK SURGERY     4   CARDIAC CATHETERIZATION     CORONARY STENT INTERVENTION N/A 04/06/2017   Procedure: CORONARY STENT INTERVENTION;  Surgeon: Cody Das, MD;  Location: [facility] INVASIVE [facility] LAB;  Service: Cardiovascular;  Laterality: N/A;    CORONARY STENT INTERVENTION N/A 04/07/2017   Procedure: CORONARY STENT INTERVENTION;  Surgeon: Cody Das, MD;  Location: [facility]AS[facility] LAB;  Service: Cardiovascular;  Laterality: N/A;   CYST EXCISION     on Back   ENDOTRACHEAL INTUBATION EMERGENT  08/07/2018       JOINT REPLACEMENT     LAPAROSCOPIC ABDOMINAL EXPLORATION N/A 11/26/2017   Procedure: LAPAROSCOPIC ABDOMINAL EXPLORATION, DRAINAGE OF APPENDICEAL ABCESS. PLACEMENT OF DRAIN;  Surgeon: Juanita Norlander, MD;  Location: [facility] OR;  Service: General;  Laterality: N/A;   RIGHT/LEFT HEART CATH AND CORONARY ANGIOGRAPHY N/A 04/06/2017   Procedure: RIGHT/LEFT HEART CATH AND CORONARY ANGIOGRAPHY;  Surgeon: Cody Das, MD;  Location: [facility] LAB;  Service: Cardiovascular;  Laterality: N/A;   TOE SURGERY  05/2018   Left    TOTAL HIP ARTHROPLASTY     Right     Prior to Admission medications   Medication Sig Start Date End Date Taking? Authorizing Provider  acetaminophen  (TYLENOL ) 500 MG tablet Take 1,000 mg by mouth in the morning and at bedtime.    [provider]  albuterol  (PROVENTIL  HFA;VENTOLIN  HFA) 108 (90 Base) MCG/ACT inhaler Inhale 2 puffs into the lungs every 6 (six) hours as needed for wheezing or shortness of breath. 04/09/17   Justina Oman, MD  amLODipine  (NORVASC ) 10 MG tablet Take 10 mg by mouth daily. 05/29/23   [provider]  aspirin  EC 81 MG tablet Take 81 mg  by mouth daily.    [provider]  atorvastatin  (LIPITOR ) 80 MG tablet Take 80 mg by mouth at bedtime.    [provider]  bisacodyl (DULCOLAX) 10 MG suppository Place 10 mg rectally daily as needed for moderate constipation.    [provider]  bismuth subsalicylate (PEPTO BISMOL) 262 MG/15ML suspension Take 30 mLs by mouth daily as needed for diarrhea or loose stools.    [provider]  cholecalciferol  (VITAMIN D3) 25 MCG (1000 UNIT) tablet Take 5,000 Units by mouth daily.    [provider]  cyanocobalamin (VITAMIN B12) 1000 MCG tablet Take 2,000 mcg by mouth daily.    [provider]  dextromethorphan-guaiFENesin  (MUCINEX  DM) 30-600 MG 12hr tablet Take 1 tablet by mouth 2 (two) times daily.    [provider]  dicyclomine  (BENTYL ) 10 MG capsule Take 10 mg by mouth in the morning, at noon, in the evening, and at bedtime.    [provider]  docusate sodium  (COLACE) 100 MG capsule Take 100 mg by mouth 2 (two) times daily.    [provider]  ferrous sulfate  325 (65 FE) MG tablet Take 325 mg by mouth daily with breakfast.    [provider]  gabapentin  (NEURONTIN ) 100 MG capsule Take 200 mg by mouth 3 (three) times daily.    [provider]  insulin  glargine-yfgn (SEMGLEE ) 100 UNIT/ML injection Inject 15 Units into the skin daily.    [provider]  isosorbide -hydrALAZINE  (BIDIL ) 20-37.5 MG tablet Take 2 tablets by mouth 3 (three) times daily. 05/25/23   Deforest Fast, MD  LANTUS  SOLOSTAR 100 UNIT/ML Solostar Pen Inject 12 Units into the skin daily.    [provider]  latanoprost  (XALATAN ) 0.005 % ophthalmic solution Place 1 drop into both eyes at bedtime. 04/10/23   [provider]  leptospermum manuka honey (MEDIHONEY) PSTE paste Apply 1 Application topically daily. Apply to buttocks and left heel as directed 09/09/23   Ozell Blunt, MD  lubiprostone (AMITIZA) 24 MCG capsule Take 24 mcg by mouth daily. 10/22/23   [provider]  melatonin 3 MG TABS tablet Take 3 mg by mouth at bedtime.    [provider]  methocarbamol  (ROBAXIN ) 500 MG tablet Take 1 tablet (500 mg total) by mouth every 6 (six) hours as needed for muscle spasms. 09/09/23   Ozell Blunt, MD  metoprolol  succinate (TOPROL -XL) 100 MG 24 hr tablet Take 100 mg by mouth daily. 05/29/23   [provider]  Multiple Vitamin (MULTIVITAMIN WITH MINERALS) TABS tablet Take 1 tablet by mouth daily. 08/16/18    Aura Leeds Latif, DO  nitroGLYCERIN  (NITROSTAT ) 0.4 MG SL tablet Place 1 tablet (0.4 mg total) under the tongue every 5 (five) minutes as needed for chest pain. 05/02/22   Patwardhan, Kaye Parsons, MD  ondansetron  (ZOFRAN ) 4 MG tablet Take 4 mg by mouth every 6 (six) hours as needed for nausea or vomiting.    [provider]  OXYGEN Inhale 3 L/min into the lungs continuous.    [provider]  pantoprazole  (PROTONIX ) 40 MG tablet Take 1 tablet (40 mg total) by mouth daily. 09/07/23 09/06/24  Ozell Blunt, MD  polyethylene glycol powder (GLYCOLAX /MIRALAX ) 17 GM/SCOOP powder Take 1 capful (17 g) by mouth daily. Patient taking differently: Take 17 g by mouth daily. PRN order: 17 g by mouth daily as needed for constipation 05/25/23   Deforest Fast, MD  pyridoxine  (B-6) 100 MG tablet Take 100 mg  by mouth daily.    [provider]  sertraline (ZOLOFT) 25 MG tablet Take 25 mg by mouth daily. 09/20/23   [provider]  torsemide  (DEMADEX ) 20 MG tablet Take 1 tablet (20 mg total) by mouth daily. Patient taking differently: Take 20 mg by mouth 2 (two) times daily. 09/07/23   Ozell Blunt, MD  traMADol  (ULTRAM ) 50 MG tablet Take 50 mg by mouth 3 (three) times daily as needed for moderate pain (pain score 4-6) or severe pain (pain score 7-10). 10/08/23   [provider]  traZODone  (DESYREL ) 100 MG tablet Take 100 mg by mouth at bedtime. 05/29/23   [provider]  UNABLE TO FIND Take 30 mLs by mouth in the morning and at bedtime. Med Name: protein liquid    [provider]    Current Facility-Administered Medications  Medication Dose Route Frequency Provider Last Rate Last Admin   acetaminophen  (TYLENOL ) tablet 650 mg  650 mg Oral Q6H PRN Sabas Cradle, MD       Or   acetaminophen  (TYLENOL ) suppository 650 mg  650 mg Rectal Q6H PRN Thomas, Sara-Maiz A, MD       albuterol  (PROVENTIL ) (2.5 MG/3ML) 0.083% nebulizer solution 2.5 mg  2.5 mg  Nebulization Q2H PRN Sabas Cradle, MD       bisacodyl (DULCOLAX) suppository 10 mg  10 mg Rectal Once [place], Benuel Brazier, MD       docusate sodium  (COLACE) capsule 100 mg  100 mg Oral Daily Ozell Blunt, MD       doxycycline  (VIBRA -TABS) tablet 100 mg  100 mg Oral Q12H Legge, Justin M, RPH   100 mg at 11/04/23 0950   feeding supplement (ENSURE PLUS HIGH PROTEIN) liquid 237 mL  237 mL Oral BID BM Barbee Lew, MD   237 mL at 11/02/23 1032   guaiFENesin -dextromethorphan (ROBITUSSIN DM) 100-10 MG/5ML syrup 5 mL  5 mL Oral Q4H PRN Barbee Lew, MD       insulin  aspart (novoLOG ) injection 0-6 Units  0-6 Units Subcutaneous TID WC Sabas Cradle, MD   1 Units at 11/04/23 1201   isosorbide -hydrALAZINE  (BIDIL ) 20-37.5 MG per tablet 2 tablet  2 tablet Oral TID Barbee Lew, MD   2 tablet at 11/04/23 0950   latanoprost  (XALATAN ) 0.005 % ophthalmic solution 1 drop  1 drop Both Eyes QHS Barbee Lew, MD   1 drop at 11/03/23 2222   lubiprostone (AMITIZA) capsule 24 mcg  24 mcg Oral Daily Mathews, Elizabeth G, MD   24 mcg at 11/04/23 0950   menthol-cetylpyridinium (CEPACOL) lozenge 3 mg  1 lozenge Oral PRN Daniels, James K, NP   3 mg at 11/03/23 2956   sertraline (ZOLOFT) tablet 25 mg  25 mg Oral Daily Mathews, Elizabeth G, MD   25 mg at 11/03/23 2130   simethicone  (MYLICON) chewable tablet 80 mg  80 mg Oral QID PRN Mathews, Elizabeth G, MD   80 mg at 11/01/23 2015   traMADol  (ULTRAM ) tablet 50 mg  50 mg Oral TID PRN Mathews, Elizabeth G, MD   50 mg at 11/04/23 8657   traZODone  (DESYREL ) tablet 100 mg  100 mg Oral QHS Mathews, Elizabeth G, MD   100 mg at 11/03/23 2220    Allergies as of 10/29/2023   (No Known Allergies)    Family History  Problem Relation Age of Onset   Diabetes Other    Hyperlipidemia Other    Hypertension Other  Stroke Other    Alzheimer's disease Other    Thyroid  disease Mother    Diabetes Mellitus II Father    Alzheimer's disease Father      Social History   Socioeconomic History   Marital status: Married    Spouse name: Larence Pleas   Number of children: 2   Years of education: 16   Highest education level: Master's degree (e.g., MA, MS, MEng, MEd, MSW, MBA)  Occupational History   Occupation: Retired  Tobacco Use   Smoking status: Former    Current packs/day: 0.00    Average packs/day: 0.3 packs/day for 5.0 years (1.3 ttl pk-yrs)    Types: Cigarettes    Start date: 29    Quit date: 1996    Years since quitting: 29.4    Passive exposure: Past   Smokeless tobacco: Never  Vaping Use   Vaping status: Never Used  Substance and Sexual Activity   Alcohol use: Not Currently    Comment: occ   Drug use: No   Sexual activity: Yes  Other Topics Concern   Not on file  Social History Narrative   Not on file   Social Drivers of Health   Financial Resource Strain: Low Risk  (05/18/2023)   Overall Financial Resource Strain (CARDIA)    Difficulty of Paying Living Expenses: Not very hard  Food Insecurity: No Food Insecurity (10/30/2023)   Hunger Vital Sign    Worried About Running Out of Food in the Last Year: Never true    Ran Out of Food in the Last Year: Never true  Transportation Needs: No Transportation Needs (10/30/2023)   PRAPARE - Administrator, Civil Service (Medical): No    Lack of Transportation (Non-Medical): No  Physical Activity: Inactive (08/20/2017)   Exercise Vital Sign    Days of Exercise per Week: 0 days    Minutes of Exercise per Session: 0 min  Stress: No Stress Concern Present (08/20/2017)   [employer] of Occupational Health - Occupational Stress Questionnaire    Feeling of Stress : Not at all  Social Connections: Socially Integrated (10/30/2023)   Social Connection and Isolation Panel    Frequency of Communication with Friends and Family: Twice a week    Frequency of Social Gatherings with Friends and Family: Twice a week    Attends Religious Services: More than 4 times per year     Active Member of Clubs or Organizations: No    Attends Banker Meetings: 1 to 4 times per year    Marital Status: Married  Catering manager Violence: Not At Risk (10/30/2023)   Humiliation, Afraid, Rape, and Kick questionnaire    Fear of Current or Ex-Partner: No    Emotionally Abused: No    Physically Abused: No    Sexually Abused: No    Review of Systems: As per HPI, all others negative  Physical Exam: Vital signs in last 24 hours: Temp:  [97.7 F (36.5 C)-98.9 F (37.2 C)] 97.7 F (36.5 C) (06/18 1212) Pulse Rate:  [59-75] 63 (06/18 1212) Resp:  [16-20] 20 (06/18 1212) BP: (123-149)/(65-72) 144/68 (06/18 1212) SpO2:  [98 %-100 %] 100 % (06/18 1212) Weight:  [88 kg] 88 kg (06/18 0500) Last BM Date : 10/30/23 (per pt) General:   Alert,  Well-developed, tachypnea at rest, weak-appearing Head:  Normocephalic and atraumatic. Eyes:  Sclera clear, no icterus.   Conjunctiva pink. Ears:  Normal auditory acuity. Nose:  No deformity, discharge,  or lesions. Mouth:  No deformity or lesions.  Oropharynx pink & moist. Neck:  Supple; no masses or thyromegaly. Lungs:  Tachypnea at rest Abdomen:  Soft, nontender and nondistended. No peritonitis Msk:  Symmetrical without gross deformities. Normal posture. Pulses:  Normal pulses noted. Extremities:  Without clubbing or edema. Neurologic:  Alert and  oriented x4; diffusely weak, otherwise grossly normal neurologically. Skin:  Intact without significant lesions or rashes. Psych:  Alert and cooperative. Normal mood and affect.   Lab Results: Recent Labs    11/04/23 0419  WBC 7.4  HGB 6.0*  HCT 20.0*  PLT 210   BMET Recent Labs    11/02/23 0342 11/03/23 0440 11/04/23 0419  NA 137 138 136  K 3.7 3.9 3.8  CL 103 104 103  CO2 27 25 25   GLUCOSE 132* 123* 113*  BUN 37* 40* 44*  CREATININE 3.18* 3.42* 3.58*  CALCIUM  8.1* 7.9* 8.1*   LFT Recent Labs    11/04/23 0419  PROT 6.6  ALBUMIN  2.3*  AST 23  ALT 12   ALKPHOS 80  BILITOT 0.8   PT/INR No results for input(s): LABPROT, INR in the last 72 hours.  Studies/Results: No results found.  Impression:   Shortness of breath.  Likely multifactorial (pneumonia, heart failure, anemia). Anemia, chronic, macrocytic.  No overt GI bleeding.  Has had intermittent anemia at least for 2 years, and was scheduled for EGD/Colon with [facility] in July 2023 but never had these procedures done. Community-acquired pneumonia.  Plan:  Patient is in no shape for endoscopic evaluation right now.   Would treat pneumonia and anemia as indicated and [facility] will revisit in a couple days. Lab work-up for anemia. Diet as tolerated OK from GI perspective. [facility] will revisit Friday.   LOS: 5 days   Jawan Chavarria M  11/04/2023, 1:21 PM  Cell [phone] If no answer or after 5 PM call [phone]

## 2023-11-04 NOTE — Progress Notes (Signed)
 [facility]  PROGRESS NOTE  David [name] DGU:[id number] DOB: 1945-05-30 DOA: 10/29/2023 PCP: Merl Star, MD   Brief HPI:   78 y.o. male with medical history significant of  CAD s/p stent, Combined systolic diastolic heart failure, CKDIII, DmII, GERD, HTN  who presents to ED BIBEMS  with complaint of sob x 2 days.  ON arrival EMS placed patient on NRB and transported to ED. patient was admitted to the hospital in April for symptomatic anemia transferred 2 units of blood.  It was deemed to be related to anemia of chronic disease.  Patient was admitted for the treatment of CHF and pneumonia. Chest x-ray showed cardiomegaly vascular congestion and small right pleural effusion with CT of the chest shows negative for PE, shows pulmonary edema, pneumonia in the right lower lobe.  Pulmonary hypertension.    Assessment/Plan:   Acute on Chronic combined systolic/diastolic  CHF exacerbation  with associate acute hypoxic respiratory failure -Lasix  has been on hold due to creatinine trending up.   - Was started on Lasix  yesterday, creatinine up to 3.42.  Lasix  currently on hold  Last echo from December 2024 with a EF of 45 to 40 to 45% noted. CT chest on admission showed no evidence of acute pulmonary embolism, CHF with pulmonary edema and moderate right and small left pleural effusion, superimposed pneumonia in the right lower lobe not excluded, dilated main pulmonary artery.   CAP presumed bacterial  Completed 5 days of  Rocephin  azithromycin .   RSV COVID and flu negative. On 4 L of oxygen saturating 99%. Encourage incentive spirometry. Continue neb treatments. Taper oxygen to baseline 2 L as tolerated and able.   Abnormal Cardiac Enzyme mildly elevated troponin at 32 -presumed due to demand Follow-up echo this admission 35 to 40% EF   Macrocytic anemia -Hemoglobin dropped to 6.0 this morning -1 unit PRBC will be transfused today -Follow CBC in a.m. -Check FOBT, consult GI for  possible EGD    Hypertension improved  -continue metoprolol , Bidil     AKI On ckd stage IV-creatinine bumped to 3.58 from 2.06 from 1.97   Will hold Lasix   Follow BMP in am    CAD s/p stent  -continue beta-blocker, aspirin  and statin. -no active issues    DMII hemoglobin A1c 6.0 on Lantus  at home       Chronic diabetic foot ulcer b/l  - Per wound care Unstageable Pressure Injury right and left heel; left and right lateral feet'; all are 100% eschar  Arterial ulceration left medial great toe; 100% clean Pressure Injury POA: Yes Cleanse all foot wounds with saline pat dry Paint all foot wounds with betadine, allow to air dry. Ok to cover with foam. Reapply betadine daily.  Offload heels at all times with prevalon boots Single layer of xeroform to the great toe wound, top with dry dressing or bandaid. Change daily Patient was supposed to follow-up with vascular surgery after discharge however he did not follow-up with them.  ABI normal on the right and moderate left lower extremity arterial disease.   You he was due to have an appointment with vascular as an outpatient but he did not follow-up on the appointment.         Pressure Injury 05/12/23 Coccyx Mid Stage 2 -  Partial thickness loss of dermis presenting as a shallow open injury with a red, pink wound bed without slough. (Active)  05/12/23 0400  Location: Coccyx  Location Orientation: Mid  Staging: Stage 2 -  Partial  thickness loss of dermis presenting as a shallow open injury with a red, pink wound bed without slough.  Wound Description (Comments):   DO NOT USE:  Present on Admission: Yes  Dressing Type Foam - Lift dressing to assess site every shift 11/02/23 0800     Pressure Injury 05/12/23 Heel Left Deep Tissue Pressure Injury - Purple or maroon localized area of discolored intact skin or blood-filled blister due to damage of underlying soft tissue from pressure and/or shear. (Active)  05/12/23 0400  Location: Heel   Location Orientation: Left  Staging: Deep Tissue Pressure Injury - Purple or maroon localized area of discolored intact skin or blood-filled blister due to damage of underlying soft tissue from pressure and/or shear.  Wound Description (Comments):   DO NOT USE:  Present on Admission: Yes  Dressing Type None 11/02/23 0800      Estimated body mass index is 24.64 kg/m as calculated from the following:   Height as of this encounter: 6' 2.02 (1.88 m).   Weight as of this encounter: 87.1 kg.      Medications     sodium chloride    Intravenous Once   doxycycline   100 mg Oral Q12H   feeding supplement  237 mL Oral BID BM   heparin   5,000 Units Subcutaneous Q8H   insulin  aspart  0-6 Units Subcutaneous TID WC   isosorbide -hydrALAZINE   2 tablet Oral TID   latanoprost   1 drop Both Eyes QHS   lubiprostone  24 mcg Oral Daily   sertraline  25 mg Oral Daily   traZODone   100 mg Oral QHS     Data Reviewed:   CBG:  Recent Labs  Lab 11/02/23 2031 11/03/23 1219 11/03/23 1655 11/03/23 2215 11/04/23 0753  GLUCAP 155* 138* 137* 125* 110*    SpO2: 100 % O2 Flow Rate (L/min): 3 L/min    Vitals:   11/03/23 2015 11/04/23 0500 11/04/23 0537 11/04/23 0906  BP: 133/65  123/66 (!) 149/72  Pulse: 63  (!) 59 65  Resp: 17  19 20   Temp: 98.9 F (37.2 C)  98.2 F (36.8 C) 97.7 F (36.5 C)  TempSrc: Oral  Oral Oral  SpO2: 100%  100% 100%  Weight:  88 kg    Height:          Data Reviewed:  Basic Metabolic Panel: Recent Labs  Lab 10/30/23 0507 11/01/23 0026 11/02/23 0342 11/03/23 0440 11/04/23 0419  NA 136 137 137 138 136  K 3.7 3.1* 3.7 3.9 3.8  CL 103 102 103 104 103  CO2 25 27 27 25 25   GLUCOSE 138* 163* 132* 123* 113*  BUN 30* 32* 37* 40* 44*  CREATININE 2.06* 2.63* 3.18* 3.42* 3.58*  CALCIUM  8.2* 8.0* 8.1* 7.9* 8.1*    CBC: Recent Labs  Lab 10/29/23 1746 10/30/23 0208 10/30/23 0507 11/01/23 0026 11/04/23 0419  WBC 8.8 10.0 9.7 9.6 7.4  NEUTROABS 7.0  --    --   --   --   HGB 8.1* 8.7* 8.4* 7.1* 6.0*  HCT 26.1* 27.7* 26.4* 22.9* 20.0*  MCV 109.7* 109.9* 107.8* 109.6* 114.3*  PLT 237 253 249 239 210    LFT Recent Labs  Lab 10/30/23 0507 11/01/23 0026 11/04/23 0419  AST 39 31 23  ALT 17 17 12   ALKPHOS 121 101 80  BILITOT 1.3* 0.8 0.8  PROT 7.9 7.2 6.6  ALBUMIN  2.6* 2.5* 2.3*     Antibiotics: Anti-infectives (From admission, onward)    Start  Dose/Rate Route Frequency Ordered Stop   11/01/23 2200  doxycycline  (VIBRA -TABS) tablet 100 mg        100 mg Oral Every 12 hours 11/01/23 0838     10/30/23 2200  cefTRIAXone  (ROCEPHIN ) 2 g in sodium chloride  0.9 % 100 mL IVPB        2 g 200 mL/hr over 30 Minutes Intravenous Every 24 hours 10/30/23 0007 11/03/23 2258   10/30/23 1000  doxycycline  (VIBRAMYCIN ) 100 mg in sodium chloride  0.9 % 250 mL IVPB  Status:  Discontinued        100 mg 125 mL/hr over 120 Minutes Intravenous Every 12 hours 10/30/23 0007 11/01/23 0838   10/29/23 2130  cefTRIAXone  (ROCEPHIN ) 1 g in sodium chloride  0.9 % 100 mL IVPB        1 g 200 mL/hr over 30 Minutes Intravenous  Once 10/29/23 2124 10/29/23 2214   10/29/23 2130  azithromycin  (ZITHROMAX ) 500 mg in sodium chloride  0.9 % 250 mL IVPB        500 mg 250 mL/hr over 60 Minutes Intravenous  Once 10/29/23 2124 10/30/23 0012        DVT prophylaxis: Heparin   Code Status: Full code  Family Communication: No family at bedside   CONSULTS    Subjective   Hemoglobin dropped to 6.0 this morning.  Still feels generalized weakness.   Objective    Physical Examination:  General-appears in no acute distress Heart-S1-S2, regular, no murmur auscultated Lungs-clear to auscultation bilaterally, no wheezing or crackles auscultated Abdomen-soft, nontender, no organomegaly Extremities-no edema in the lower extremities Neuro-alert, oriented x3, no focal deficit noted   Status is: Inpatient:      Pressure Injury 05/12/23 Coccyx Mid Stage 2 -  Partial  thickness loss of dermis presenting as a shallow open injury with a red, pink wound bed without slough. (Active)  05/12/23 0400  Location: Coccyx  Location Orientation: Mid  Staging: Stage 2 -  Partial thickness loss of dermis presenting as a shallow open injury with a red, pink wound bed without slough.  Wound Description (Comments):   DO NOT USE:  Present on Admission: Yes     Pressure Injury 05/12/23 Heel Left Deep Tissue Pressure Injury - Purple or maroon localized area of discolored intact skin or blood-filled blister due to damage of underlying soft tissue from pressure and/or shear. (Active)  05/12/23 0400  Location: Heel  Location Orientation: Left  Staging: Deep Tissue Pressure Injury - Purple or maroon localized area of discolored intact skin or blood-filled blister due to damage of underlying soft tissue from pressure and/or shear.  Wound Description (Comments):   DO NOT USE:  Present on Admission: Yes        Vance Hochmuth S Madix Blowe   [facility] If 7PM-7AM, please contact night-coverage at [email or web address], Office  [phone]   11/04/2023, 9:19 AM  LOS: 5 days

## 2023-11-04 NOTE — Progress Notes (Signed)
 Taking over care of pt at 1500.  Agree with previous RN assessment.

## 2023-11-04 NOTE — Plan of Care (Signed)

## 2023-11-05 DIAGNOSIS — J189 Pneumonia, unspecified organism: Secondary | ICD-10-CM | POA: Diagnosis not present

## 2023-11-05 DIAGNOSIS — I509 Heart failure, unspecified: Secondary | ICD-10-CM | POA: Diagnosis not present

## 2023-11-05 DIAGNOSIS — I5043 Acute on chronic combined systolic (congestive) and diastolic (congestive) heart failure: Secondary | ICD-10-CM | POA: Diagnosis not present

## 2023-11-05 LAB — CBC
HCT: 26.3 % — ABNORMAL LOW (ref 39.0–52.0)
Hemoglobin: 8 g/dL — ABNORMAL LOW (ref 13.0–17.0)
MCH: 33.3 pg (ref 26.0–34.0)
MCHC: 30.4 g/dL (ref 30.0–36.0)
MCV: 109.6 fL — ABNORMAL HIGH (ref 80.0–100.0)
Platelets: 237 10*3/uL (ref 150–400)
RBC: 2.4 MIL/uL — ABNORMAL LOW (ref 4.22–5.81)
RDW: 20.5 % — ABNORMAL HIGH (ref 11.5–15.5)
WBC: 8 10*3/uL (ref 4.0–10.5)
nRBC: 0 % (ref 0.0–0.2)

## 2023-11-05 LAB — COMPREHENSIVE METABOLIC PANEL WITH GFR
ALT: 12 U/L (ref 0–44)
AST: 23 U/L (ref 15–41)
Albumin: 2.6 g/dL — ABNORMAL LOW (ref 3.5–5.0)
Alkaline Phosphatase: 89 U/L (ref 38–126)
Anion gap: 6 (ref 5–15)
BUN: 31 mg/dL — ABNORMAL HIGH (ref 8–23)
CO2: 25 mmol/L (ref 22–32)
Calcium: 8.5 mg/dL — ABNORMAL LOW (ref 8.9–10.3)
Chloride: 106 mmol/L (ref 98–111)
Creatinine, Ser: 3.47 mg/dL — ABNORMAL HIGH (ref 0.61–1.24)
GFR, Estimated: 18 mL/min — ABNORMAL LOW (ref >60)
Glucose, Bld: 119 mg/dL — ABNORMAL HIGH (ref 70–99)
Potassium: 4.1 mmol/L (ref 3.5–5.1)
Sodium: 137 mmol/L (ref 135–145)
Total Bilirubin: 0.9 mg/dL (ref 0.0–1.2)
Total Protein: 7.6 g/dL (ref 6.5–8.1)

## 2023-11-05 LAB — BPAM RBC
Blood Product Expiration Date: 202507152359
ISSUE DATE / TIME: 202506180907
Unit Type and Rh: 5100

## 2023-11-05 LAB — TYPE AND SCREEN
ABO/RH(D): O POS
Antibody Screen: NEGATIVE
Unit division: 0

## 2023-11-05 LAB — GLUCOSE, CAPILLARY
Glucose-Capillary: 119 mg/dL — ABNORMAL HIGH (ref 70–99)
Glucose-Capillary: 122 mg/dL — ABNORMAL HIGH (ref 70–99)
Glucose-Capillary: 136 mg/dL — ABNORMAL HIGH (ref 70–99)
Glucose-Capillary: 170 mg/dL — ABNORMAL HIGH (ref 70–99)

## 2023-11-05 LAB — HAPTOGLOBIN: Haptoglobin: 105 mg/dL (ref 34–355)

## 2023-11-05 MED ORDER — SENNOSIDES-DOCUSATE SODIUM 8.6-50 MG PO TABS
2.0000 | ORAL_TABLET | Freq: Every day | ORAL | Status: AC
  Administered 2023-11-09: 2 via ORAL
  Filled 2023-11-05 (×3): qty 2

## 2023-11-05 NOTE — Progress Notes (Signed)
 [facility]  PROGRESS NOTE  David [name] NGE:[id number] DOB: February 16, 1946 DOA: 10/29/2023 PCP: Merl Star, MD   Brief HPI:   78 y.o. male with medical history significant of  CAD s/p stent, Combined systolic diastolic heart failure, CKDIII, DmII, GERD, HTN  who presents to ED BIBEMS  with complaint of sob x 2 days.  ON arrival EMS placed patient on NRB and transported to ED. patient was admitted to the hospital in April for symptomatic anemia transferred 2 units of blood.  It was deemed to be related to anemia of chronic disease.  Patient was admitted for the treatment of CHF and pneumonia. Chest x-ray showed cardiomegaly vascular congestion and small right pleural effusion with CT of the chest shows negative for PE, shows pulmonary edema, pneumonia in the right lower lobe.  Pulmonary hypertension.    Assessment/Plan:   Acute on Chronic combined systolic/diastolic  CHF exacerbation  with associate acute hypoxic respiratory failure -Lasix  has been on hold due to creatinine trending up.   - Was started on Lasix  yesterday, creatinine up to 3.42.  Lasix  currently on hold  Last echo from December 2024 with a EF of 45 to 40 to 45% noted. CT chest on admission showed no evidence of acute pulmonary embolism, CHF with pulmonary edema and moderate right and small left pleural effusion, superimposed pneumonia in the right lower lobe not excluded, dilated main pulmonary artery.   CAP presumed bacterial  Completed 5 days of  Rocephin  azithromycin .   RSV COVID and flu negative. On 4 L of oxygen saturating 99%. Encourage incentive spirometry. Continue neb treatments. Taper oxygen to baseline 2 L as tolerated and able.   Abnormal Cardiac Enzyme mildly elevated troponin at 32 -presumed due to demand Follow-up echo this admission 35 to 40% EF   Macrocytic anemia -Hemoglobin dropped to 6.0 on 11/04/2023 -Status post 1 unit PRBC -Hemoglobin is up to 8.0 -GI consulted, plan for EGD once  patient more stable - FOBT pending    Hypertension improved  -continue metoprolol , Bidil     AKI On ckd stage IV-creatinine bumped to 3.58 from 2.06 from 1.97   -Creatinine has improved to 3.47 Continue to hold Lasix   Follow BMP in am    CAD s/p stent  -continue beta-blocker, aspirin  and statin. -no active issues    DMII hemoglobin A1c 6.0 on Lantus  at home       Chronic diabetic foot ulcer b/l  - Per wound care Unstageable Pressure Injury right and left heel; left and right lateral feet'; all are 100% eschar  Arterial ulceration left medial great toe; 100% clean Pressure Injury POA: Yes Cleanse all foot wounds with saline pat dry Paint all foot wounds with betadine, allow to air dry. Ok to cover with foam. Reapply betadine daily.  Offload heels at all times with prevalon boots Single layer of xeroform to the great toe wound, top with dry dressing or bandaid. Change daily Patient was supposed to follow-up with vascular surgery after discharge however he did not follow-up with them.  ABI normal on the right and moderate left lower extremity arterial disease.   You he was due to have an appointment with vascular as an outpatient but he did not follow-up on the appointment.         Pressure Injury 05/12/23 Coccyx Mid Stage 2 -  Partial thickness loss of dermis presenting as a shallow open injury with a red, pink wound bed without slough. (Active)  05/12/23 0400  Location: Coccyx  Location Orientation: Mid  Staging: Stage 2 -  Partial thickness loss of dermis presenting as a shallow open injury with a red, pink wound bed without slough.  Wound Description (Comments):   DO NOT USE:  Present on Admission: Yes  Dressing Type Foam - Lift dressing to assess site every shift 11/02/23 0800     Pressure Injury 05/12/23 Heel Left Deep Tissue Pressure Injury - Purple or maroon localized area of discolored intact skin or blood-filled blister due to damage of underlying soft tissue from  pressure and/or shear. (Active)  05/12/23 0400  Location: Heel  Location Orientation: Left  Staging: Deep Tissue Pressure Injury - Purple or maroon localized area of discolored intact skin or blood-filled blister due to damage of underlying soft tissue from pressure and/or shear.  Wound Description (Comments):   DO NOT USE:  Present on Admission: Yes  Dressing Type None 11/02/23 0800      Estimated body mass index is 24.64 kg/m as calculated from the following:   Height as of this encounter: 6' 2.02 (1.88 m).   Weight as of this encounter: 87.1 kg.      Medications     bisacodyl  10 mg Rectal Once   docusate sodium   100 mg Oral Daily   doxycycline   100 mg Oral Q12H   feeding supplement  237 mL Oral BID BM   insulin  aspart  0-6 Units Subcutaneous TID WC   isosorbide -hydrALAZINE   2 tablet Oral TID   latanoprost   1 drop Both Eyes QHS   lubiprostone  24 mcg Oral Daily   sertraline  25 mg Oral Daily   traZODone   100 mg Oral QHS     Data Reviewed:   CBG:  Recent Labs  Lab 11/04/23 1141 11/04/23 1621 11/04/23 2028 11/05/23 0739 11/05/23 1113  GLUCAP 166* 153* 176* 119* 122*    SpO2: 100 % O2 Flow Rate (L/min): 3 L/min    Vitals:   11/05/23 0455 11/05/23 0500 11/05/23 1114 11/05/23 1237  BP: 135/63  (!) 158/76 139/75  Pulse: 64  72 72  Resp: 17   20  Temp: 98.1 F (36.7 C)   97.9 F (36.6 C)  TempSrc:    Oral  SpO2: 100%   100%  Weight:  89 kg    Height:          Data Reviewed:  Basic Metabolic Panel: Recent Labs  Lab 11/01/23 0026 11/02/23 0342 11/03/23 0440 11/04/23 0419 11/05/23 0843  NA 137 137 138 136 137  K 3.1* 3.7 3.9 3.8 4.1  CL 102 103 104 103 106  CO2 27 27 25 25 25   GLUCOSE 163* 132* 123* 113* 119*  BUN 32* 37* 40* 44* 31*  CREATININE 2.63* 3.18* 3.42* 3.58* 3.47*  CALCIUM  8.0* 8.1* 7.9* 8.1* 8.5*    CBC: Recent Labs  Lab 10/29/23 1746 10/30/23 0208 10/30/23 0507 11/01/23 0026 11/04/23 0419 11/04/23 1358  11/05/23 0843  WBC 8.8 10.0 9.7 9.6 7.4  --  8.0  NEUTROABS 7.0  --   --   --   --   --   --   HGB 8.1* 8.7* 8.4* 7.1* 6.0* 7.6* 8.0*  HCT 26.1* 27.7* 26.4* 22.9* 20.0* 24.5* 26.3*  MCV 109.7* 109.9* 107.8* 109.6* 114.3*  --  109.6*  PLT 237 253 249 239 210  --  237    LFT Recent Labs  Lab 10/30/23 0507 11/01/23 0026 11/04/23 0419 11/05/23 0843  AST 39 31 23 23   ALT 17  17 12 12   ALKPHOS 121 101 80 89  BILITOT 1.3* 0.8 0.8 0.9  PROT 7.9 7.2 6.6 7.6  ALBUMIN  2.6* 2.5* 2.3* 2.6*     Antibiotics: Anti-infectives (From admission, onward)    Start     Dose/Rate Route Frequency Ordered Stop   11/01/23 2200  doxycycline  (VIBRA -TABS) tablet 100 mg        100 mg Oral Every 12 hours 11/01/23 0838     10/30/23 2200  cefTRIAXone  (ROCEPHIN ) 2 g in sodium chloride  0.9 % 100 mL IVPB        2 g 200 mL/hr over 30 Minutes Intravenous Every 24 hours 10/30/23 0007 11/03/23 2258   10/30/23 1000  doxycycline  (VIBRAMYCIN ) 100 mg in sodium chloride  0.9 % 250 mL IVPB  Status:  Discontinued        100 mg 125 mL/hr over 120 Minutes Intravenous Every 12 hours 10/30/23 0007 11/01/23 0838   10/29/23 2130  cefTRIAXone  (ROCEPHIN ) 1 g in sodium chloride  0.9 % 100 mL IVPB        1 g 200 mL/hr over 30 Minutes Intravenous  Once 10/29/23 2124 10/29/23 2214   10/29/23 2130  azithromycin  (ZITHROMAX ) 500 mg in sodium chloride  0.9 % 250 mL IVPB        500 mg 250 mL/hr over 60 Minutes Intravenous  Once 10/29/23 2124 10/30/23 0012        DVT prophylaxis: Heparin   Code Status: Full code  Family Communication: No family at bedside   CONSULTS    Subjective   Hemoglobin dropped to 6.0 this morning.  Still feels generalized weakness.   Objective    Physical Examination:  General-appears in no acute distress Heart-S1-S2, regular, no murmur auscultated Lungs-clear to auscultation bilaterally, no wheezing or crackles auscultated Abdomen-soft, nontender, no organomegaly Extremities-no edema in the  lower extremities Neuro-alert, oriented x3, no focal deficit noted   Status is: Inpatient:      Pressure Injury 05/12/23 Coccyx Mid Stage 2 -  Partial thickness loss of dermis presenting as a shallow open injury with a red, pink wound bed without slough. (Active)  05/12/23 0400  Location: Coccyx  Location Orientation: Mid  Staging: Stage 2 -  Partial thickness loss of dermis presenting as a shallow open injury with a red, pink wound bed without slough.  Wound Description (Comments):   DO NOT USE:  Present on Admission: Yes     Pressure Injury 05/12/23 Heel Left Deep Tissue Pressure Injury - Purple or maroon localized area of discolored intact skin or blood-filled blister due to damage of underlying soft tissue from pressure and/or shear. (Active)  05/12/23 0400  Location: Heel  Location Orientation: Left  Staging: Deep Tissue Pressure Injury - Purple or maroon localized area of discolored intact skin or blood-filled blister due to damage of underlying soft tissue from pressure and/or shear.  Wound Description (Comments):   DO NOT USE:  Present on Admission: Yes        David [name]   [facility] If 7PM-7AM, please contact night-coverage at [email or web address], Office  [phone]   11/05/2023, 3:37 PM  LOS: 6 days

## 2023-11-05 NOTE — Progress Notes (Signed)
 Patient David [name] is alert and oriented x 4. Patient Fetch declined AM labs and wound care at [phone].  Education provided to both patient and David [name].  David [name] stated that she understood the education provided and accepted that Patient David [name] had the right to decline.  David [name] attempted to convince patient to take labs and wound care.  Patient continued to decline both treatment and labs.  On call NP contacted via Secure Chat.  Labs rescheduled to 0900 and wound care rescheduled to a later time.  Patient stated that wound care had already been done and not needed again.  Explanation of wound care schedule explained to patient by David Jobs, RN.

## 2023-11-06 DIAGNOSIS — J189 Pneumonia, unspecified organism: Secondary | ICD-10-CM | POA: Diagnosis not present

## 2023-11-06 DIAGNOSIS — I5043 Acute on chronic combined systolic (congestive) and diastolic (congestive) heart failure: Secondary | ICD-10-CM | POA: Diagnosis not present

## 2023-11-06 DIAGNOSIS — I509 Heart failure, unspecified: Secondary | ICD-10-CM | POA: Diagnosis not present

## 2023-11-06 LAB — BASIC METABOLIC PANEL WITH GFR
Anion gap: 10 (ref 5–15)
BUN: 44 mg/dL — ABNORMAL HIGH (ref 8–23)
CO2: 25 mmol/L (ref 22–32)
Calcium: 8.7 mg/dL — ABNORMAL LOW (ref 8.9–10.3)
Chloride: 103 mmol/L (ref 98–111)
Creatinine, Ser: 2.91 mg/dL — ABNORMAL HIGH (ref 0.61–1.24)
GFR, Estimated: 22 mL/min — ABNORMAL LOW (ref >60)
Glucose, Bld: 130 mg/dL — ABNORMAL HIGH (ref 70–99)
Potassium: 4.1 mmol/L (ref 3.5–5.1)
Sodium: 138 mmol/L (ref 135–145)

## 2023-11-06 LAB — CBC
HCT: 22.9 % — ABNORMAL LOW (ref 39.0–52.0)
Hemoglobin: 7.1 g/dL — ABNORMAL LOW (ref 13.0–17.0)
MCH: 33.2 pg (ref 26.0–34.0)
MCHC: 31 g/dL (ref 30.0–36.0)
MCV: 107 fL — ABNORMAL HIGH (ref 80.0–100.0)
Platelets: 204 10*3/uL (ref 150–400)
RBC: 2.14 MIL/uL — ABNORMAL LOW (ref 4.22–5.81)
RDW: 19.7 % — ABNORMAL HIGH (ref 11.5–15.5)
WBC: 7.4 10*3/uL (ref 4.0–10.5)
nRBC: 0 % (ref 0.0–0.2)

## 2023-11-06 LAB — GLUCOSE, CAPILLARY
Glucose-Capillary: 128 mg/dL — ABNORMAL HIGH (ref 70–99)
Glucose-Capillary: 135 mg/dL — ABNORMAL HIGH (ref 70–99)
Glucose-Capillary: 164 mg/dL — ABNORMAL HIGH (ref 70–99)
Glucose-Capillary: 153 mg/dL — ABNORMAL HIGH (ref 70–99)

## 2023-11-06 MED ORDER — DICYCLOMINE HCL 10 MG PO CAPS
10.0000 mg | ORAL_CAPSULE | Freq: Once | ORAL | Status: AC
  Administered 2023-11-07: 10 mg via ORAL
  Filled 2023-11-06: qty 1

## 2023-11-06 MED ORDER — MORPHINE SULFATE (PF) 2 MG/ML IV SOLN
1.0000 mg | Freq: Once | INTRAVENOUS | Status: AC | PRN
  Administered 2023-11-06: 1 mg via INTRAVENOUS
  Filled 2023-11-06: qty 1

## 2023-11-06 MED ORDER — BISACODYL 5 MG PO TBEC
5.0000 mg | DELAYED_RELEASE_TABLET | Freq: Every day | ORAL | Status: DC | PRN
  Administered 2023-11-18: 5 mg via ORAL
  Filled 2023-11-06: qty 1

## 2023-11-06 NOTE — Progress Notes (Signed)
 [facility]  PROGRESS NOTE  David [name] DOB: 08-Jan-1946 DOA: 10/29/2023 PCP: Merl Star, MD   Brief HPI:   78 y.o. male with medical history significant of  CAD David/p stent, Combined systolic diastolic heart failure, CKDIII, DmII, GERD, HTN  who presents to ED BIBEMS  with complaint of sob x 2 days.  ON arrival EMS placed patient on NRB and transported to ED. patient was admitted to the hospital in April for symptomatic anemia transferred 2 units of blood.  It was deemed to be related to anemia of chronic disease.  Patient was admitted for the treatment of CHF and pneumonia. Chest x-ray showed cardiomegaly vascular congestion and small right pleural effusion with CT of the chest shows negative for PE, shows pulmonary edema, pneumonia in the right lower lobe.  Pulmonary hypertension.    Assessment/Plan:   Acute on Chronic combined systolic/diastolic  CHF exacerbation  with associate acute hypoxic respiratory failure -Lasix  has been on hold due to creatinine trending up.   - Was started on Lasix  yesterday, creatinine up to 3.42.  Lasix  currently on hold  Last echo from December 2024 with a EF of 45 to 40 to 45% noted. CT chest on admission showed no evidence of acute pulmonary embolism, CHF with pulmonary edema and moderate right and small left pleural effusion, superimposed pneumonia in the right lower lobe not excluded, dilated main pulmonary artery.   CAP presumed bacterial  Completed 5 days of  Rocephin  azithromycin .   RSV COVID and flu negative. On 4 L of oxygen saturating 99%. Encourage incentive spirometry. Continue neb treatments. Taper oxygen to baseline 2 L as tolerated and able.   Abnormal Cardiac Enzyme mildly elevated troponin at 32 -presumed due to demand Follow-up echo this admission 35 to 40% EF   Macrocytic anemia -Hemoglobin dropped to 6.0 on 11/04/2023 -Status post 1 unit PRBC -Hemoglobin is up to 8.0 -GI consulted, plan for EGD in a.m. -  FOBT pending    Hypertension improved  -continue metoprolol , Bidil     AKI On ckd stage IV-creatinine bumped to 3.58 from 2.06 from 1.97   -Creatinine has improved to 2.91 Continue to hold Lasix   Follow BMP in am    CAD David/p stent  -continue beta-blocker, aspirin  and statin. -no active issues    DMII hemoglobin  A1c 6.0 on Lantus  at home       Chronic diabetic foot ulcer b/l  - Per wound care Unstageable Pressure Injury right and left heel; left and right lateral feet'; all are 100% eschar  Arterial ulceration left medial great toe; 100% clean Pressure Injury POA: Yes Cleanse all foot wounds with saline pat dry Paint all foot wounds with betadine, allow to air dry. Ok to cover with foam. Reapply betadine daily.  Offload heels at all times with prevalon boots Single layer of xeroform to the great toe wound, top with dry dressing or bandaid. Change daily Patient was supposed to follow-up with vascular surgery after discharge however he did not follow-up with them.  ABI normal on the right and moderate left lower extremity arterial disease.   You he was due to have an appointment with vascular as an outpatient but he did not follow-up on the appointment.         Pressure Injury 05/12/23 Coccyx Mid Stage 2 -  Partial thickness loss of dermis presenting as a shallow open injury with a red, pink wound bed without slough. (Active)  05/12/23 0400  Location: Coccyx  Location  Orientation: Mid  Staging: Stage 2 -  Partial thickness loss of dermis presenting as a shallow open injury with a red, pink wound bed without slough.  Wound Description (Comments):   DO NOT USE:  Present on Admission: Yes  Dressing Type Foam - Lift dressing to assess site every shift 11/02/23 0800     Pressure Injury 05/12/23 Heel Left Deep Tissue Pressure Injury - Purple or maroon localized area of discolored intact skin or blood-filled blister due to damage of underlying soft tissue from pressure and/or shear.  (Active)  05/12/23 0400  Location: Heel  Location Orientation: Left  Staging: Deep Tissue Pressure Injury - Purple or maroon localized area of discolored intact skin or blood-filled blister due to damage of underlying soft tissue from pressure and/or shear.  Wound Description (Comments):   DO NOT USE:  Present on Admission: Yes  Dressing Type None 11/02/23 0800      Estimated body mass index is 24.64 kg/m as calculated from the following:   Height as of this encounter: 6' 2.02 (1.88 m).   Weight as of this encounter: 87.1 kg.      Medications     bisacodyl  10 mg Rectal Once   docusate sodium   100 mg Oral Daily   doxycycline   100 mg Oral Q12H   feeding supplement  237 mL Oral BID BM   insulin  aspart  0-6 Units Subcutaneous TID WC   isosorbide -hydrALAZINE   2 tablet Oral TID   latanoprost   1 drop Both Eyes QHS   lubiprostone  24 mcg Oral Daily   senna-docusate  2 tablet Oral QHS   sertraline  25 mg Oral Daily   traZODone   100 mg Oral QHS     Data Reviewed:   CBG:  Recent Labs  Lab 11/05/23 1113 11/05/23 1615 11/05/23 2024 11/06/23 0735 11/06/23 1132  GLUCAP 122* 136* 170* 128* 135*    SpO2: 100 % O2 Flow Rate (L/min): 3 L/min    Vitals:   11/05/23 2023 11/06/23 0329 11/06/23 0414 11/06/23 1124  BP: 138/69 130/65  (!) 163/78  Pulse: 65 61  74  Resp: 17 16    Temp: 97.8 F (36.6 C) 98 F (36.7 C)    TempSrc: Oral     SpO2: 99% 100%    Weight:   89.2 kg   Height:          Data Reviewed:  Basic Metabolic Panel: Recent Labs  Lab 11/02/23 0342 11/03/23 0440 11/04/23 0419 11/05/23 0843 11/06/23 0951  NA 137 138 136 137 138  K 3.7 3.9 3.8 4.1 4.1  CL 103 104 103 106 103  CO2 27 25 25 25 25   GLUCOSE 132* 123* 113* 119* 130*  BUN 37* 40* 44* 31* 44*  CREATININE 3.18* 3.42* 3.58* 3.47* 2.91*  CALCIUM  8.1* 7.9* 8.1* 8.5* 8.7*    CBC: Recent Labs  Lab 11/01/23 0026 11/04/23 0419 11/04/23 1358 11/05/23 0843 11/06/23 0407  WBC 9.6 7.4   --  8.0 7.4  HGB 7.1* 6.0* 7.6* 8.0* 7.1*  HCT 22.9* 20.0* 24.5* 26.3* 22.9*  MCV 109.6* 114.3*  --  109.6* 107.0*  PLT 239 210  --  237 204    LFT Recent Labs  Lab 11/01/23 0026 11/04/23 0419 11/05/23 0843  AST 31 23 23   ALT 17 12 12   ALKPHOS 101 80 89  BILITOT 0.8 0.8 0.9  PROT 7.2 6.6 7.6  ALBUMIN  2.5* 2.3* 2.6*     Antibiotics: Anti-infectives (From admission, onward)  Start     Dose/Rate Route Frequency Ordered Stop   11/01/23 2200  doxycycline  (VIBRA -TABS) tablet 100 mg        100 mg Oral Every 12 hours 11/01/23 0838     10/30/23 2200  cefTRIAXone  (ROCEPHIN ) 2 g in sodium chloride  0.9 % 100 mL IVPB        2 g 200 mL/hr over 30 Minutes Intravenous Every 24 hours 10/30/23 0007 11/03/23 2258   10/30/23 1000  doxycycline  (VIBRAMYCIN ) 100 mg in sodium chloride  0.9 % 250 mL IVPB  Status:  Discontinued        100 mg 125 mL/hr over 120 Minutes Intravenous Every 12 hours 10/30/23 0007 11/01/23 0838   10/29/23 2130  cefTRIAXone  (ROCEPHIN ) 1 g in sodium chloride  0.9 % 100 mL IVPB        1 g 200 mL/hr over 30 Minutes Intravenous  Once 10/29/23 2124 10/29/23 2214   10/29/23 2130  azithromycin  (ZITHROMAX ) 500 mg in sodium chloride  0.9 % 250 mL IVPB        500 mg 250 mL/hr over 60 Minutes Intravenous  Once 10/29/23 2124 10/30/23 0012        DVT prophylaxis: Heparin   Code Status: Full code  Family Communication: No family at bedside   CONSULTS    Subjective   Hemoglobin dropped to 7.1 this morning.  Still complains of generalized weakness/fatigue.  Denies shortness of breath   Objective    Physical Examination:  General-appears in no acute distress Heart-S1-S2, regular, no murmur auscultated Lungs-clear to auscultation bilaterally, no wheezing or crackles auscultated Abdomen-soft, nontender, no organomegaly Extremities-no edema in the lower extremities Neuro-alert, oriented x3, no focal deficit noted  Status is: Inpatient:      Pressure Injury  05/12/23 Coccyx Mid Stage 2 -  Partial thickness loss of dermis presenting as a shallow open injury with a red, pink wound bed without slough. (Active)  05/12/23 0400  Location: Coccyx  Location Orientation: Mid  Staging: Stage 2 -  Partial thickness loss of dermis presenting as a shallow open injury with a red, pink wound bed without slough.  Wound Description (Comments):   DO NOT USE:  Present on Admission: Yes     Pressure Injury 05/12/23 Heel Left Deep Tissue Pressure Injury - Purple or maroon localized area of discolored intact skin or blood-filled blister due to damage of underlying soft tissue from pressure and/or shear. (Active)  05/12/23 0400  Location: Heel  Location Orientation: Left  Staging: Deep Tissue Pressure Injury - Purple or maroon localized area of discolored intact skin or blood-filled blister due to damage of underlying soft tissue from pressure and/or shear.  Wound Description (Comments):   DO NOT USE:  Present on Admission: Yes        David [name] David [name]   [facility] If 7PM-7AM, please contact night-coverage at [email or web address], Office  [phone]   11/06/2023, 1:59 PM  LOS: 7 days

## 2023-11-06 NOTE — Progress Notes (Signed)
 Subjective: No abdominal pain or blood in stool. Still doesn't feel well.  Objective: Vital signs in last 24 hours: Temp:  [97.8 F (36.6 C)-98 F (36.7 C)] 98 F (36.7 C) (06/20 0329) Pulse Rate:  [61-72] 61 (06/20 0329) Resp:  [16-20] 16 (06/20 0329) BP: (130-158)/(65-76) 130/65 (06/20 0329) SpO2:  [99 %-100 %] 100 % (06/20 0329) Weight:  [89.2 kg] 89.2 kg (06/20 0414) Weight change: 0.2 kg Last BM Date : 11/03/23  PE: GEN:  Less tachypnea cf two days ago, NAD ABD:  Soft, non-tender  Lab Results: CBC    Component Value Date/Time   WBC 7.4 11/06/2023 0407   RBC 2.14 (L) 11/06/2023 0407   HGB 7.1 (L) 11/06/2023 0407   HGB 8.0 (L) 09/10/2018 1527   HCT 22.9 (L) 11/06/2023 0407   HCT 23.6 (L) 09/10/2018 1527   PLT 204 11/06/2023 0407   PLT 206 09/10/2018 1527   MCV 107.0 (H) 11/06/2023 0407   MCV 108 (H) 09/10/2018 1527   MCH 33.2 11/06/2023 0407   MCHC 31.0 11/06/2023 0407   RDW 19.7 (H) 11/06/2023 0407   RDW 12.5 09/10/2018 1527   LYMPHSABS 1.0 10/29/2023 1746   LYMPHSABS 1.4 07/20/2018 1456   MONOABS 0.8 10/29/2023 1746   EOSABS 0.1 10/29/2023 1746   EOSABS 0.1 07/20/2018 1456   BASOSABS 0.0 10/29/2023 1746   BASOSABS 0.0 07/20/2018 1456  CMP     Component Value Date/Time   NA 137 11/05/2023 0843   NA 138 05/02/2022 1523   K 4.1 11/05/2023 0843   CL 106 11/05/2023 0843   CO2 25 11/05/2023 0843   GLUCOSE 119 (H) 11/05/2023 0843   BUN 31 (H) 11/05/2023 0843   BUN 29 (H) 05/02/2022 1523   CREATININE 3.47 (H) 11/05/2023 0843   CALCIUM  8.5 (L) 11/05/2023 0843   PROT 7.6 11/05/2023 0843   PROT 6.8 09/10/2018 1526   ALBUMIN  2.6 (L) 11/05/2023 0843   ALBUMIN  3.7 09/10/2018 1526   AST 23 11/05/2023 0843   ALT 12 11/05/2023 0843   ALKPHOS 89 11/05/2023 0843   BILITOT 0.9 11/05/2023 0843   BILITOT 0.6 09/10/2018 1526   EGFR 33 (L) 05/02/2022 1523   GFRNONAA 18 (L) 11/05/2023 0843    Assessment:   Shortness of breath.  Likely multifactorial  (pneumonia, heart failure, anemia). Anemia, chronic, macrocytic.  No overt GI bleeding.  Has had intermittent anemia at least for 2 years, and was scheduled for EGD/Colon with [facility] in July 2023 but never had these procedures done. Community-acquired pneumonia.  Improved from respiratory standpoint  Plan:   I believe patient is well-enough from respiratory standpoint to tolerate endoscopic procedures with the associated sedation. I have advised endoscopy and colonoscopy.  Patient declines colonoscopy.  So, for now, we'll plan on endoscopy tomorrow. Risks (bleeding, infection, bowel perforation that could require surgery, sedation-related changes in cardiopulmonary systems), benefits (identification and possible treatment of source of symptoms, exclusion of certain causes of symptoms), and alternatives (watchful waiting, radiographic imaging studies, empiric medical treatment) of upper endoscopy (EGD) were explained to patient/family in detail and patient wishes to proceed.  Follow CBCs, transfuse as needed, PPI. [facility] will follow.   Yves Herb 11/06/2023, 9:59 AM   Cell [phone] If no answer or after 5 PM call [phone]

## 2023-11-06 NOTE — Progress Notes (Signed)
   11/06/23 0844  Readmission Prevention Plan - Extreme Risk  Transportation Screening Complete  Medication Review Complete  Home Care Consult Complete  Social Work consult for recovery care planning/counseling (includes patient and caregiver) Complete  Palliative Care Screening Not Applicable  Skilled Nursing Facility Not Applicable

## 2023-11-07 ENCOUNTER — Inpatient Hospital Stay (HOSPITAL_COMMUNITY): Admitting: Anesthesiology

## 2023-11-07 ENCOUNTER — Encounter (HOSPITAL_COMMUNITY)
Admission: EM | Disposition: A | Discharge status: Skilled Nursing Facility | Payer: Self-pay | Source: Skilled Nursing Facility | Attending: Family Medicine

## 2023-11-07 ENCOUNTER — Encounter (HOSPITAL_COMMUNITY): Payer: Self-pay

## 2023-11-07 DIAGNOSIS — I5043 Acute on chronic combined systolic (congestive) and diastolic (congestive) heart failure: Secondary | ICD-10-CM | POA: Diagnosis not present

## 2023-11-07 DIAGNOSIS — I5033 Acute on chronic diastolic (congestive) heart failure: Secondary | ICD-10-CM | POA: Diagnosis not present

## 2023-11-07 DIAGNOSIS — J189 Pneumonia, unspecified organism: Secondary | ICD-10-CM | POA: Diagnosis not present

## 2023-11-07 DIAGNOSIS — I130 Hypertensive heart and chronic kidney disease with heart failure and stage 1 through stage 4 chronic kidney disease, or unspecified chronic kidney disease: Secondary | ICD-10-CM | POA: Diagnosis not present

## 2023-11-07 DIAGNOSIS — N1832 Chronic kidney disease, stage 3b: Secondary | ICD-10-CM | POA: Diagnosis not present

## 2023-11-07 DIAGNOSIS — K259 Gastric ulcer, unspecified as acute or chronic, without hemorrhage or perforation: Secondary | ICD-10-CM

## 2023-11-07 DIAGNOSIS — I509 Heart failure, unspecified: Secondary | ICD-10-CM | POA: Diagnosis not present

## 2023-11-07 HISTORY — PX: ESOPHAGOGASTRODUODENOSCOPY: SHX5428

## 2023-11-07 LAB — CBC
HCT: 24.8 % — ABNORMAL LOW (ref 39.0–52.0)
Hemoglobin: 7.6 g/dL — ABNORMAL LOW (ref 13.0–17.0)
MCH: 33.3 pg (ref 26.0–34.0)
MCHC: 30.6 g/dL (ref 30.0–36.0)
MCV: 108.8 fL — ABNORMAL HIGH (ref 80.0–100.0)
Platelets: 225 10*3/uL (ref 150–400)
RBC: 2.28 MIL/uL — ABNORMAL LOW (ref 4.22–5.81)
RDW: 19.5 % — ABNORMAL HIGH (ref 11.5–15.5)
WBC: 9.5 10*3/uL (ref 4.0–10.5)
nRBC: 0 % (ref 0.0–0.2)

## 2023-11-07 LAB — GLUCOSE, CAPILLARY
Glucose-Capillary: 134 mg/dL — ABNORMAL HIGH (ref 70–99)
Glucose-Capillary: 122 mg/dL — ABNORMAL HIGH (ref 70–99)
Glucose-Capillary: 144 mg/dL — ABNORMAL HIGH (ref 70–99)
Glucose-Capillary: 181 mg/dL — ABNORMAL HIGH (ref 70–99)

## 2023-11-07 LAB — BASIC METABOLIC PANEL WITH GFR
Anion gap: 10 (ref 5–15)
BUN: 43 mg/dL — ABNORMAL HIGH (ref 8–23)
CO2: 24 mmol/L (ref 22–32)
Calcium: 8.7 mg/dL — ABNORMAL LOW (ref 8.9–10.3)
Chloride: 103 mmol/L (ref 98–111)
Creatinine, Ser: 2.67 mg/dL — ABNORMAL HIGH (ref 0.61–1.24)
GFR, Estimated: 24 mL/min — ABNORMAL LOW (ref >60)
Glucose, Bld: 142 mg/dL — ABNORMAL HIGH (ref 70–99)
Potassium: 3.9 mmol/L (ref 3.5–5.1)
Sodium: 137 mmol/L (ref 135–145)

## 2023-11-07 SURGERY — EGD (ESOPHAGOGASTRODUODENOSCOPY)
Anesthesia: Monitor Anesthesia Care | Laterality: Left

## 2023-11-07 MED ORDER — FENTANYL CITRATE (PF) 100 MCG/2ML IJ SOLN
INTRAMUSCULAR | Status: DC | PRN
  Administered 2023-11-07 (×2): 25 ug via INTRAVENOUS

## 2023-11-07 MED ORDER — ETOMIDATE 2 MG/ML IV SOLN
INTRAVENOUS | Status: AC
  Filled 2023-11-07: qty 10

## 2023-11-07 MED ORDER — MORPHINE SULFATE (PF) 2 MG/ML IV SOLN
1.0000 mg | INTRAVENOUS | Status: AC | PRN
  Administered 2023-11-07 – 2023-11-09 (×4): 1 mg via INTRAVENOUS
  Filled 2023-11-07 (×4): qty 1

## 2023-11-07 MED ORDER — PANTOPRAZOLE SODIUM 40 MG PO TBEC
40.0000 mg | DELAYED_RELEASE_TABLET | Freq: Every day | ORAL | Status: DC
  Administered 2023-11-07 – 2023-11-26 (×17): 40 mg via ORAL
  Filled 2023-11-07 (×20): qty 1

## 2023-11-07 MED ORDER — PHENYLEPHRINE 80 MCG/ML (10ML) SYRINGE FOR IV PUSH (FOR BLOOD PRESSURE SUPPORT)
PREFILLED_SYRINGE | INTRAVENOUS | Status: DC | PRN
  Administered 2023-11-07 (×2): 80 ug via INTRAVENOUS

## 2023-11-07 MED ORDER — PROPOFOL 500 MG/50ML IV EMUL
INTRAVENOUS | Status: DC | PRN
  Administered 2023-11-07: 40 ug/kg/min via INTRAVENOUS
  Administered 2023-11-07: 10 mg via INTRAVENOUS

## 2023-11-07 MED ORDER — EPINEPHRINE PF 1 MG/ML IJ SOLN
INTRAMUSCULAR | Status: AC
  Filled 2023-11-07: qty 2

## 2023-11-07 MED ORDER — ETOMIDATE 2 MG/ML IV SOLN
INTRAVENOUS | Status: DC | PRN
Start: 2023-11-07 — End: 2023-11-07
  Administered 2023-11-07 (×2): 2 mg via INTRAVENOUS

## 2023-11-07 MED ORDER — LIDOCAINE 2% (20 MG/ML) 5 ML SYRINGE
INTRAMUSCULAR | Status: DC | PRN
Start: 2023-11-07 — End: 2023-11-07
  Administered 2023-11-07: 100 mg via INTRAVENOUS

## 2023-11-07 MED ORDER — SODIUM CHLORIDE 0.9 % IV SOLN: INTRAVENOUS | Status: DC | PRN

## 2023-11-07 MED ORDER — FENTANYL CITRATE (PF) 100 MCG/2ML IJ SOLN
INTRAMUSCULAR | Status: AC
  Filled 2023-11-07: qty 2

## 2023-11-07 NOTE — Op Note (Signed)
 [facility] Patient Name: David [name] Procedure Date: 11/07/2023 MRN: [id number] Attending MD: Oliva Boots , MD, [id number] Date of Birth: 02-08-1946 CSN: [id number] Age: 78 Admit Type: Outpatient Procedure:                Upper GI endoscopy Indications:              Iron  deficiency anemia secondary to chronic blood                            loss Providers:                Oliva Boots, MD, Particia Fischer, RN, Felice Sar,                            Technician Referring MD:              Medicines:                Monitored Anesthesia Care Complications:            No immediate complications. Estimated Blood Loss:     Estimated blood loss: none. Procedure:                Pre-Anesthesia Assessment:                           - Prior to the procedure, a History and Physical                            was performed, and patient medications and                            allergies were reviewed. The patient's tolerance of                            previous anesthesia was also reviewed. The risks                            and benefits of the procedure and the sedation                            options and risks were discussed with the patient.                            All questions were answered, and informed consent                            was obtained. Prior Anticoagulants: The patient has                            taken no anticoagulant or antiplatelet agents                            except for aspirin . ASA Grade Assessment: III - A                            patient  with severe systemic disease. After                            reviewing the risks and benefits, the patient was                            deemed in satisfactory condition to undergo the                            procedure.                           After obtaining informed consent, the endoscope was                            passed under direct vision. Throughout the                             procedure, the patient's blood pressure, pulse, and                            oxygen saturations were monitored continuously. The                            [id number]-[id number] ([id number]) Olympus endoscope was introduced                            through the mouth, and advanced to the third part                            of duodenum. The upper GI endoscopy was                            accomplished without difficulty. The patient                            tolerated the procedure well. Scope In: Scope Out: Findings:      The larynx was normal.      LA Grade A (one or more mucosal breaks less than 5 mm, not extending       between tops of 2 mucosal folds) esophagitis with no bleeding was found.       Very minimal distal esophagitis      One non-bleeding superficial gastric ulcer and localized inflammation       with no stigmata of bleeding was found in the gastric fundus. Biopsies       were taken with a cold forceps for histology.      Diffuse mild mucosal changes characterized by Melanosis looking were       found in the duodenal bulb, in the second portion of the duodenum and in       the third portion of the duodenum. Biopsies were taken with a cold       forceps for histology.      A single small submucosal nodule was found in the third portion of the       duodenum. Biopsies were taken with a cold  forceps for histology.      The exam of the duodenum was otherwise normal. Except for a bulbous       ampulla which was draining normal bile      The exam was otherwise without abnormality. Impression:               - Normal larynx.                           - LA Grade A reflux esophagitis with no bleeding.                           - Non-bleeding gastric ulcer with no stigmata of                            bleeding. Biopsied.                           - Mucosal changes in the duodenum. Biopsied.                           - Submucosal nodule found in the duodenum. Biopsied.                            - The examination was otherwise normal. Moderate Sedation:      Not Applicable - Patient had care per Anesthesia. Recommendation:           - Soft diet today.                           - Continue present medications. Will add once a day                            Protonix                            - Await pathology results.                           - Return to GI clinic PRN.                           - Telephone GI clinic if symptomatic PRN.                           - Telephone GI clinic for pathology results in 5                            days.                           - Perform a colonoscopy at appointment to be                            scheduled. Either inpatient or outpatient when he  will agree to it and please let us  know if he                            changes his mind with either us  or his primary                            gastroenterologist at [facility] Procedure Code(s):        --- Professional ---                           [phone], Esophagogastroduodenoscopy, flexible,                            transoral; with biopsy, single or multiple Diagnosis Code(s):        --- Professional ---                           K21.00, Gastro-esophageal reflux disease with                            esophagitis, without bleeding                           K25.9, Gastric ulcer, unspecified as acute or                            chronic, without hemorrhage or perforation                           K31.89, Other diseases of stomach and duodenum                           D50.0, Iron  deficiency anemia secondary to blood                            loss (chronic) CPT copyright 2022 [employer]. All rights reserved. The codes documented in this report are preliminary and upon coder review may  be revised to meet current compliance requirements. Oliva Boots, MD 11/07/2023 8:21:17 AM This report has been signed electronically. Number of Addenda: 0

## 2023-11-07 NOTE — Progress Notes (Signed)
 [facility]  PROGRESS NOTE  David [name] FMW:[id number] DOB: [birth year] DOA: 10/29/2023 PCP: Rexanne Ingle, MD   Brief HPI:   78 y.o. male with medical history significant of  CAD s/p stent, Combined systolic diastolic heart failure, CKDIII, DmII, GERD, HTN  who presents to ED BIBEMS  with complaint of sob x 2 days.  ON arrival EMS placed patient on NRB and transported to ED. patient was admitted to the hospital in April for symptomatic anemia transferred 2 units of blood.  It was deemed to be related to anemia of chronic disease.  Patient was admitted for the treatment of CHF and pneumonia. Chest x-ray showed cardiomegaly vascular congestion and small right pleural effusion with CT of the chest shows negative for PE, shows pulmonary edema, pneumonia in the right lower lobe.  Pulmonary hypertension.    Assessment/Plan:   Acute on Chronic combined systolic/diastolic  CHF exacerbation  with associate acute hypoxic respiratory failure -Lasix  has been on hold due to creatinine trending up.   - Was started on Lasix , creatinine up to 3.42.  Lasix  currently on hold  Last echo from December 2024 with a EF of 45 to 40 to 45% noted. CT chest on admission showed no evidence of acute pulmonary embolism, CHF with pulmonary edema and moderate right and small left pleural effusion, superimposed pneumonia in the right lower lobe not excluded, dilated main pulmonary artery.   CAP presumed bacterial  Completed 5 days of  Rocephin  azithromycin .   RSV COVID and flu negative. On 4 L of oxygen saturating 99%. Encourage incentive spirometry. Continue neb treatments. Taper oxygen to baseline 2 L as tolerated and able.   Abnormal Cardiac Enzyme mildly elevated troponin at 32 -presumed due to demand Follow-up echo this admission 35 to 40% EF   Macrocytic anemia -Hemoglobin dropped to 6.0 on 11/04/2023 -Status post 1 unit PRBC -Hemoglobin is up to 8.0 -GI consulted, underwent EGD, which showed  nonbleeding gastric ulcer, mucosal changes in duodenum; biopsies obtained - Plan to follow-up with GI as outpatient.  Patient to call GI for biopsy result in 5 days    Hypertension improved  -continue metoprolol , Bidil     AKI On ckd stage IV-creatinine bumped to 3.58 from 2.06 from 1.97   -Creatinine has improved to 2.67 Continue to hold Lasix   Follow BMP in am    CAD s/p stent  -continue beta-blocker, aspirin  and statin. -no active issues    DMII  hemoglobin A1c 6.0  on Lantus  at home      Chronic diabetic foot ulcer b/l  - Per wound care Unstageable Pressure Injury right and left heel; left and right lateral feet'; all are 100% eschar  Arterial ulceration left medial great toe; 100% clean Pressure Injury POA: Yes Cleanse all foot wounds with saline pat dry Paint all foot wounds with betadine, allow to air dry. Ok to cover with foam. Reapply betadine daily.  Offload heels at all times with prevalon boots Single layer of xeroform to the great toe wound, top with dry dressing or bandaid. Change daily Patient was supposed to follow-up with vascular surgery after discharge however he did not follow-up with them.  ABI normal on the right and moderate left lower extremity arterial disease.    he was due to have an appointment with vascular as an outpatient but he did not follow-up on the appointment.         Pressure Injury 05/12/23 Coccyx Mid Stage 2 -  Partial thickness loss of dermis  presenting as a shallow open injury with a red, pink wound bed without slough. (Active)  05/12/23 0400  Location: Coccyx  Location Orientation: Mid  Staging: Stage 2 -  Partial thickness loss of dermis presenting as a shallow open injury with a red, pink wound bed without slough.  Wound Description (Comments):   DO NOT USE:  Present on Admission: Yes  Dressing Type Foam - Lift dressing to assess site every shift 11/02/23 0800     Pressure Injury 05/12/23 Heel Left Deep Tissue Pressure Injury -  Purple or maroon localized area of discolored intact skin or blood-filled blister due to damage of underlying soft tissue from pressure and/or shear. (Active)  05/12/23 0400  Location: Heel  Location Orientation: Left  Staging: Deep Tissue Pressure Injury - Purple or maroon localized area of discolored intact skin or blood-filled blister due to damage of underlying soft tissue from pressure and/or shear.  Wound Description (Comments):   DO NOT USE:  Present on Admission: Yes  Dressing Type None 11/02/23 0800      Estimated body mass index is 24.64 kg/m as calculated from the following:   Height as of this encounter: 6' 2.02 (1.88 m).   Weight as of this encounter: 87.1 kg.      Medications     [MAR Hold] bisacodyl   10 mg Rectal Once   [MAR Hold] docusate sodium   100 mg Oral Daily   [MAR Hold] doxycycline   100 mg Oral Q12H   [MAR Hold] feeding supplement  237 mL Oral BID BM   [MAR Hold] insulin  aspart  0-6 Units Subcutaneous TID WC   [MAR Hold] isosorbide -hydrALAZINE   2 tablet Oral TID   [MAR Hold] latanoprost   1 drop Both Eyes QHS   [MAR Hold] lubiprostone   24 mcg Oral Daily   [MAR Hold] senna-docusate  2 tablet Oral QHS   [MAR Hold] sertraline   25 mg Oral Daily   [MAR Hold] traZODone   100 mg Oral QHS     Data Reviewed:   CBG:  Recent Labs  Lab 11/06/23 0735 11/06/23 1132 11/06/23 1651 11/06/23 2058 11/07/23 0740  GLUCAP 128* 135* 164* 153* 134*    SpO2: 99 % O2 Flow Rate (L/min): 2 L/min    Vitals:   11/07/23 0825 11/07/23 0830 11/07/23 0840 11/07/23 0847  BP: (!) 155/78 (!) 156/86 (!) 153/82 (!) 156/83  Pulse: 73 73 71 71  Resp: 15 10 10 12   Temp: 98.5 F (36.9 C)     TempSrc: Temporal     SpO2: 97% 99% 99% 99%  Weight:      Height:          Data Reviewed:  Basic Metabolic Panel: Recent Labs  Lab 11/03/23 0440 11/04/23 0419 11/05/23 0843 11/06/23 0951 11/07/23 0419  NA 138 136 137 138 137  K 3.9 3.8 4.1 4.1 3.9  CL 104 103 106 103 103   CO2 25 25 25 25 24   GLUCOSE 123* 113* 119* 130* 142*  BUN 40* 44* 31* 44* 43*  CREATININE 3.42* 3.58* 3.47* 2.91* 2.67*  CALCIUM  7.9* 8.1* 8.5* 8.7* 8.7*    CBC: Recent Labs  Lab 11/01/23 0026 11/04/23 0419 11/04/23 1358 11/05/23 0843 11/06/23 0407 11/07/23 0419  WBC 9.6 7.4  --  8.0 7.4 9.5  HGB 7.1* 6.0* 7.6* 8.0* 7.1* 7.6*  HCT 22.9* 20.0* 24.5* 26.3* 22.9* 24.8*  MCV 109.6* 114.3*  --  109.6* 107.0* 108.8*  PLT 239 210  --  237 204 225  LFT Recent Labs  Lab 11/01/23 0026 11/04/23 0419 11/05/23 0843  AST 31 23 23   ALT 17 12 12   ALKPHOS 101 80 89  BILITOT 0.8 0.8 0.9  PROT 7.2 6.6 7.6  ALBUMIN  2.5* 2.3* 2.6*     Antibiotics: Anti-infectives (From admission, onward)    Start     Dose/Rate Route Frequency Ordered Stop   11/01/23 2200  [MAR Hold]  doxycycline  (VIBRA -TABS) tablet 100 mg        (MAR Hold since Sat 11/07/2023 at 0720.Hold Reason: Transfer to a Procedural area)   100 mg Oral Every 12 hours 11/01/23 0838     10/30/23 2200  cefTRIAXone  (ROCEPHIN ) 2 g in sodium chloride  0.9 % 100 mL IVPB        2 g 200 mL/hr over 30 Minutes Intravenous Every 24 hours 10/30/23 0007 11/03/23 2258   10/30/23 1000  doxycycline  (VIBRAMYCIN ) 100 mg in sodium chloride  0.9 % 250 mL IVPB  Status:  Discontinued        100 mg 125 mL/hr over 120 Minutes Intravenous Every 12 hours 10/30/23 0007 11/01/23 0838   10/29/23 2130  cefTRIAXone  (ROCEPHIN ) 1 g in sodium chloride  0.9 % 100 mL IVPB        1 g 200 mL/hr over 30 Minutes Intravenous  Once 10/29/23 2124 10/29/23 2214   10/29/23 2130  azithromycin  (ZITHROMAX ) 500 mg in sodium chloride  0.9 % 250 mL IVPB        500 mg 250 mL/hr over 60 Minutes Intravenous  Once 10/29/23 2124 10/30/23 0012        DVT prophylaxis: Heparin   Code Status: Full code  Family Communication: No family at bedside   CONSULTS    Subjective   Denies shortness of breath.  S/p EGD today.  No significant source of bleeding noted.   Objective     Physical Examination:  General-appears in no acute distress Heart-S1-S2, regular, no murmur auscultated Lungs-clear to auscultation bilaterally, no wheezing or crackles auscultated Abdomen-soft, nontender, no organomegaly Extremities-no edema in the lower extremities Neuro-alert, oriented x3, no focal deficit noted  Status is: Inpatient:      Pressure Injury 05/12/23 Coccyx Mid Stage 2 -  Partial thickness loss of dermis presenting as a shallow open injury with a red, pink wound bed without slough. (Active)  05/12/23 0400  Location: Coccyx  Location Orientation: Mid  Staging: Stage 2 -  Partial thickness loss of dermis presenting as a shallow open injury with a red, pink wound bed without slough.  Wound Description (Comments):   DO NOT USE:  Present on Admission: Yes     Pressure Injury 05/12/23 Heel Left Deep Tissue Pressure Injury - Purple or maroon localized area of discolored intact skin or blood-filled blister due to damage of underlying soft tissue from pressure and/or shear. (Active)  05/12/23 0400  Location: Heel  Location Orientation: Left  Staging: Deep Tissue Pressure Injury - Purple or maroon localized area of discolored intact skin or blood-filled blister due to damage of underlying soft tissue from pressure and/or shear.  Wound Description (Comments):   DO NOT USE:  Present on Admission: Yes        Tranika Scholler S Eugenia Eldredge   [facility] If 7PM-7AM, please contact night-coverage at [email or web address], Office  [phone]   11/07/2023, 9:05 AM  LOS: 8 days

## 2023-11-07 NOTE — Anesthesia Preprocedure Evaluation (Signed)
 Anesthesia Evaluation  Patient identified by MRN, date of birth, ID band Patient awake    Reviewed: Allergy & Precautions, NPO status , Patient's Chart, lab work & pertinent test results, reviewed documented beta blocker date and time   History of Anesthesia Complications Negative for: history of anesthetic complications  Airway Mallampati: II  TM Distance: >3 FB Neck ROM: Full    Dental  (+) Teeth Intact   Pulmonary pneumonia, former smoker   Pulmonary exam normal        Cardiovascular hypertension, Pt. on medications and Pt. on home beta blockers + CAD, + Cardiac Stents (2018), + Peripheral Vascular Disease and +CHF  Normal cardiovascular exam     Neuro/Psych  Headaches  Neuromuscular disease  negative psych ROS   GI/Hepatic Neg liver ROS,GERD  ,,  Endo/Other  diabetes, Type 2, Insulin  Dependent    Renal/GU Renal InsufficiencyRenal disease  negative genitourinary   Musculoskeletal  (+) Arthritis ,    Abdominal   Peds  Hematology  (+) Blood dyscrasia, anemia   Anesthesia Other Findings   Reproductive/Obstetrics                             Anesthesia Physical Anesthesia Plan  ASA: III  Anesthesia Plan: MAC   Post-op Pain Management: Minimal or no pain anticipated   Induction: Intravenous  PONV Risk Score and Plan: 1 and Propofol  infusion, TIVA and Treatment may vary due to age or medical condition  Airway Management Planned: Natural Airway and Nasal Cannula  Additional Equipment: None  Intra-op Plan:   Post-operative Plan:   Informed Consent: I have reviewed the patients History and Physical, chart, labs and discussed the procedure including the risks, benefits and alternatives for the proposed anesthesia with the patient or authorized representative who has indicated his/her understanding and acceptance.       Plan Discussed with:   Anesthesia Plan Comments:          Anesthesia Quick Evaluation

## 2023-11-07 NOTE — Transfer of Care (Signed)
 Immediate Anesthesia Transfer of Care Note  Patient: David [name]  Procedure(s) Performed: EGD (ESOPHAGOGASTRODUODENOSCOPY) (Left)  Patient Location: PACU  Anesthesia Type:MAC  Level of Consciousness: awake, alert , and oriented  Airway & Oxygen Therapy: Patient Spontanous Breathing and Patient connected to face mask oxygen  Post-op Assessment: Report given to RN and Post -op Vital signs reviewed and stable  Post vital signs: Reviewed and stable  Last Vitals:  Vitals Value Taken Time  BP 155/78 11/07/23 08:25  Temp    Pulse 73 11/07/23 08:27  Resp 11 11/07/23 08:27  SpO2 98 % 11/07/23 08:27  Vitals shown include unfiled device data.  Last Pain:  Vitals:   11/07/23 0735  TempSrc: Temporal  PainSc: 7       Patients Stated Pain Goal: 0 (11/04/23 0949)  Complications: No notable events documented.

## 2023-11-07 NOTE — Anesthesia Postprocedure Evaluation (Signed)
 Anesthesia Post Note  Patient: David [name]  Procedure(s) Performed: EGD (ESOPHAGOGASTRODUODENOSCOPY) (Left)     Patient location during evaluation: PACU Anesthesia Type: MAC Level of consciousness: awake and alert Pain management: pain level controlled Vital Signs Assessment: post-procedure vital signs reviewed and stable Respiratory status: spontaneous breathing, nonlabored ventilation and respiratory function stable Cardiovascular status: blood pressure returned to baseline and stable Postop Assessment: no apparent nausea or vomiting Anesthetic complications: no   No notable events documented.  Last Vitals:  Vitals:   11/07/23 0840 11/07/23 0847  BP: (!) 153/82 (!) 156/83  Pulse: 71 71  Resp: 10 12  Temp:    SpO2: 99% 99%    Last Pain:  Vitals:   11/07/23 1030  TempSrc:   PainSc: 0-No pain                 Butler Levander Pinal

## 2023-11-07 NOTE — Progress Notes (Signed)
 David [name] 7:37 AM  Subjective: Patient seen and examined and case discussed with my partner Dr. Burnette and his hospital computer chart reviewed and other than abdominal cramps he has no GI complaints  Objective: Vital signs stable afebrile no acute distress abdomen is soft nontender exam please see preassessment valuation creatinine slight decrease hemoglobin stable CT of abdomen normal in April CT in June of the chest with questionable gastric wall thickening  Assessment: Anemia not iron  deficient no guaiacs on chart  Plan: Okay to proceed with endoscopy today with anesthesia assistance  [facility]  office [phone] After 5PM or if no answer call [phone]

## 2023-11-07 NOTE — Anesthesia Procedure Notes (Signed)
 Procedure Name: MAC Date/Time: 11/07/2023 8:00 AM  Performed by: Obadiah Reyes BROCKS, CRNAPre-anesthesia Checklist: Patient identified, Emergency Drugs available, Suction available, Patient being monitored and Timeout performed Patient Re-evaluated:Patient Re-evaluated prior to induction Oxygen Delivery Method: Simple face mask Preoxygenation: Pre-oxygenation with 100% oxygen Induction Type: IV induction

## 2023-11-08 DIAGNOSIS — I5043 Acute on chronic combined systolic (congestive) and diastolic (congestive) heart failure: Secondary | ICD-10-CM | POA: Diagnosis not present

## 2023-11-08 DIAGNOSIS — J189 Pneumonia, unspecified organism: Secondary | ICD-10-CM | POA: Diagnosis not present

## 2023-11-08 DIAGNOSIS — I509 Heart failure, unspecified: Secondary | ICD-10-CM | POA: Diagnosis not present

## 2023-11-08 LAB — CBC
HCT: 22.2 % — ABNORMAL LOW (ref 39.0–52.0)
Hemoglobin: 6.8 g/dL — CL (ref 13.0–17.0)
MCH: 33.3 pg (ref 26.0–34.0)
MCHC: 30.6 g/dL (ref 30.0–36.0)
MCV: 108.8 fL — ABNORMAL HIGH (ref 80.0–100.0)
Platelets: 214 10*3/uL (ref 150–400)
RBC: 2.04 MIL/uL — ABNORMAL LOW (ref 4.22–5.81)
RDW: 19.5 % — ABNORMAL HIGH (ref 11.5–15.5)
WBC: 7.5 10*3/uL (ref 4.0–10.5)
nRBC: 0 % (ref 0.0–0.2)

## 2023-11-08 LAB — BASIC METABOLIC PANEL WITH GFR
Anion gap: 9 (ref 5–15)
BUN: 40 mg/dL — ABNORMAL HIGH (ref 8–23)
CO2: 25 mmol/L (ref 22–32)
Calcium: 8.4 mg/dL — ABNORMAL LOW (ref 8.9–10.3)
Chloride: 103 mmol/L (ref 98–111)
Creatinine, Ser: 2.39 mg/dL — ABNORMAL HIGH (ref 0.61–1.24)
GFR, Estimated: 27 mL/min — ABNORMAL LOW (ref >60)
Glucose, Bld: 142 mg/dL — ABNORMAL HIGH (ref 70–99)
Potassium: 3.9 mmol/L (ref 3.5–5.1)
Sodium: 137 mmol/L (ref 135–145)

## 2023-11-08 LAB — GLUCOSE, CAPILLARY
Glucose-Capillary: 139 mg/dL — ABNORMAL HIGH (ref 70–99)
Glucose-Capillary: 211 mg/dL — ABNORMAL HIGH (ref 70–99)
Glucose-Capillary: 213 mg/dL — ABNORMAL HIGH (ref 70–99)

## 2023-11-08 LAB — PREPARE RBC (CROSSMATCH)

## 2023-11-08 MED ORDER — SODIUM CHLORIDE 0.9% IV SOLUTION: Freq: Once | INTRAVENOUS | Status: AC

## 2023-11-08 MED ORDER — SERTRALINE HCL 50 MG PO TABS: 50.0000 mg | ORAL_TABLET | Freq: Every day | ORAL | Status: DC

## 2023-11-08 MED ORDER — TORSEMIDE 20 MG PO TABS
20.0000 mg | ORAL_TABLET | Freq: Every day | ORAL | Status: DC
  Administered 2023-11-08 – 2023-11-26 (×16): 20 mg via ORAL
  Filled 2023-11-08 (×19): qty 1

## 2023-11-08 MED ORDER — DICYCLOMINE HCL 10 MG PO CAPS
10.0000 mg | ORAL_CAPSULE | Freq: Three times a day (TID) | ORAL | Status: DC
  Administered 2023-11-08 – 2023-11-09 (×5): 10 mg via ORAL
  Filled 2023-11-08 (×5): qty 1

## 2023-11-08 NOTE — Progress Notes (Signed)
 [facility]  PROGRESS NOTE  David [name] FMW:[id number] DOB: Aug 20, 1945 DOA: 10/29/2023 PCP: Rexanne Ingle, MD   Brief HPI:   78 y.o. male with medical history significant of  CAD s/p stent, Combined systolic diastolic heart failure, CKDIII, DmII, GERD, HTN  who presents to ED BIBEMS  with complaint of sob x 2 days.  ON arrival EMS placed patient on NRB and transported to ED. patient was admitted to the hospital in April for symptomatic anemia transferred 2 units of blood.  It was deemed to be related to anemia of chronic disease.  Patient was admitted for the treatment of CHF and pneumonia. Chest x-ray showed cardiomegaly vascular congestion and small right pleural effusion with CT of the chest shows negative for PE, shows pulmonary edema, pneumonia in the right lower lobe.  Pulmonary hypertension.    Assessment/Plan:   Acute on Chronic combined systolic/diastolic  CHF exacerbation  with associate acute hypoxic respiratory failure -Lasix  has been on hold due to creatinine trending up.   - Was started on Lasix , creatinine up to 3.42.  Lasix  currently on hold  Last echo from December 2024 with a EF of 45 to 40 to 45% noted. CT chest on admission showed no evidence of acute pulmonary embolism, CHF with pulmonary edema and moderate right and small left pleural effusion, superimposed pneumonia in the right lower lobe not excluded, dilated main pulmonary artery. -Will start patient on torsemide  20 mg daily, patient takes torsemide  20 mg p.o. twice daily at home.   CAP presumed bacterial  Completed 5 days of  Rocephin  azithromycin .   RSV COVID and flu negative. On 4 L of oxygen saturating 99%. Encourage incentive spirometry. Continue neb treatments. Taper oxygen to baseline 2 L as tolerated and able.   Abnormal Cardiac Enzyme mildly elevated troponin at 32 -presumed due to demand Follow-up echo this admission 35 to 40% EF  Epigastric discomfort - Will start Bentyl  10 mg p.o. 3  times daily with meals -Discontinue Amitiza  - Will start Zoloft  50 mg daily   Macrocytic anemia -Hemoglobin dropped to 6.0 on 11/04/2023 -Status post 1 unit PRBC -Hemoglobin is up to 8.0 -GI consulted, underwent EGD, which showed nonbleeding gastric ulcer, mucosal changes in duodenum; biopsies obtained - Plan to follow-up with GI as outpatient.  Patient to call GI for biopsy result in 5 days - Patient has been refusing colonoscopy at this time.    Hypertension improved  -continue metoprolol , Bidil     AKI On ckd stage IV-creatinine bumped to 3.58 from 2.06 from 1.97   -Creatinine has improved to 2.39 Started on torsemide  Follow BMP in am    CAD s/p stent  -continue beta-blocker, aspirin  and statin. -no active issues    DMII  hemoglobin A1c 6.0  on Lantus  at home      Chronic diabetic foot ulcer b/l  - Per wound care Unstageable Pressure Injury right and left heel; left and right lateral feet'; all are 100% eschar  Arterial ulceration left medial great toe; 100% clean Pressure Injury POA: Yes Cleanse all foot wounds with saline pat dry Paint all foot wounds with betadine, allow to air dry. Ok to cover with foam. Reapply betadine daily.  Offload heels at all times with prevalon boots Single layer of xeroform to the great toe wound, top with dry dressing or bandaid. Change daily Patient was supposed to follow-up with vascular surgery after discharge however he did not follow-up with them.  ABI normal on the right and moderate left  lower extremity arterial disease.    he was due to have an appointment with vascular as an outpatient but he did not follow-up on the appointment.         Pressure Injury 05/12/23 Coccyx Mid Stage 2 -  Partial thickness loss of dermis presenting as a shallow open injury with a red, pink wound bed without slough. (Active)  05/12/23 0400  Location: Coccyx  Location Orientation: Mid  Staging: Stage 2 -  Partial thickness loss of dermis presenting as  a shallow open injury with a red, pink wound bed without slough.  Wound Description (Comments):   DO NOT USE:  Present on Admission: Yes  Dressing Type Foam - Lift dressing to assess site every shift 11/02/23 0800     Pressure Injury 05/12/23 Heel Left Deep Tissue Pressure Injury - Purple or maroon localized area of discolored intact skin or blood-filled blister due to damage of underlying soft tissue from pressure and/or shear. (Active)  05/12/23 0400  Location: Heel  Location Orientation: Left  Staging: Deep Tissue Pressure Injury - Purple or maroon localized area of discolored intact skin or blood-filled blister due to damage of underlying soft tissue from pressure and/or shear.  Wound Description (Comments):   DO NOT USE:  Present on Admission: Yes  Dressing Type None 11/02/23 0800      Estimated body mass index is 24.64 kg/m as calculated from the following:   Height as of this encounter: 6' 2.02 (1.88 m).   Weight as of this encounter: 87.1 kg.      Medications     bisacodyl   10 mg Rectal Once   dicyclomine   10 mg Oral TID AC   docusate sodium   100 mg Oral Daily   doxycycline   100 mg Oral Q12H   feeding supplement  237 mL Oral BID BM   insulin  aspart  0-6 Units Subcutaneous TID WC   isosorbide -hydrALAZINE   2 tablet Oral TID   latanoprost   1 drop Both Eyes QHS   pantoprazole   40 mg Oral Daily   senna-docusate  2 tablet Oral QHS   sertraline   25 mg Oral Daily   torsemide   20 mg Oral Daily   traZODone   100 mg Oral QHS     Data Reviewed:   CBG:  Recent Labs  Lab 11/07/23 0740 11/07/23 1058 11/07/23 1620 11/07/23 2308 11/08/23 1100  GLUCAP 134* 122* 144* 181* 139*    SpO2: 100 % O2 Flow Rate (L/min): 2 L/min    Vitals:   11/08/23 0240 11/08/23 0500 11/08/23 1330 11/08/23 1410  BP: 134/64  130/65 (!) 143/67  Pulse: (!) 59  68 69  Resp: 17  18 16   Temp: 98.4 F (36.9 C)  98.3 F (36.8 C) 98.1 F (36.7 C)  TempSrc: Oral  Oral Oral  SpO2: 100%  100%  100%  Weight:  91.6 kg    Height:          Data Reviewed:  Basic Metabolic Panel: Recent Labs  Lab 11/04/23 0419 11/05/23 0843 11/06/23 0951 11/07/23 0419 11/08/23 0357  NA 136 137 138 137 137  K 3.8 4.1 4.1 3.9 3.9  CL 103 106 103 103 103  CO2 25 25 25 24 25   GLUCOSE 113* 119* 130* 142* 142*  BUN 44* 31* 44* 43* 40*  CREATININE 3.58* 3.47* 2.91* 2.67* 2.39*  CALCIUM  8.1* 8.5* 8.7* 8.7* 8.4*    CBC: Recent Labs  Lab 11/04/23 0419 11/04/23 1358 11/05/23 0843 11/06/23 0407 11/07/23 0419 11/08/23  0357  WBC 7.4  --  8.0 7.4 9.5 7.5  HGB 6.0* 7.6* 8.0* 7.1* 7.6* 6.8*  HCT 20.0* 24.5* 26.3* 22.9* 24.8* 22.2*  MCV 114.3*  --  109.6* 107.0* 108.8* 108.8*  PLT 210  --  237 204 225 214    LFT Recent Labs  Lab 11/04/23 0419 11/05/23 0843  AST 23 23  ALT 12 12  ALKPHOS 80 89  BILITOT 0.8 0.9  PROT 6.6 7.6  ALBUMIN  2.3* 2.6*     Antibiotics: Anti-infectives (From admission, onward)    Start     Dose/Rate Route Frequency Ordered Stop   11/01/23 2200  doxycycline  (VIBRA -TABS) tablet 100 mg        100 mg Oral Every 12 hours 11/01/23 0838     10/30/23 2200  cefTRIAXone  (ROCEPHIN ) 2 g in sodium chloride  0.9 % 100 mL IVPB        2 g 200 mL/hr over 30 Minutes Intravenous Every 24 hours 10/30/23 0007 11/03/23 2258   10/30/23 1000  doxycycline  (VIBRAMYCIN ) 100 mg in sodium chloride  0.9 % 250 mL IVPB  Status:  Discontinued        100 mg 125 mL/hr over 120 Minutes Intravenous Every 12 hours 10/30/23 0007 11/01/23 0838   10/29/23 2130  cefTRIAXone  (ROCEPHIN ) 1 g in sodium chloride  0.9 % 100 mL IVPB        1 g 200 mL/hr over 30 Minutes Intravenous  Once 10/29/23 2124 10/29/23 2214   10/29/23 2130  azithromycin  (ZITHROMAX ) 500 mg in sodium chloride  0.9 % 250 mL IVPB        500 mg 250 mL/hr over 60 Minutes Intravenous  Once 10/29/23 2124 10/30/23 0012        DVT prophylaxis: Heparin   Code Status: Full code  Family Communication: No family at  bedside   CONSULTS    Subjective   Patient seen and examined, complains of epigastric discomfort.  Patient says that he usually gets this sharp pain in upper abdomen when he sees a stranger in the room or an unfamiliar person comes and talks to him. Hemoglobin dropped to 6.8 this morning.  Objective    Physical Examination:  General-appears in no acute distress Heart-S1-S2, regular, no murmur auscultated Lungs-clear to auscultation bilaterally, no wheezing or crackles auscultated Abdomen-soft, nontender, no organomegaly Extremities-no edema in the lower extremities Neuro-alert, oriented x3, no focal deficit noted  Status is: Inpatient:      Pressure Injury 05/12/23 Coccyx Mid Stage 2 -  Partial thickness loss of dermis presenting as a shallow open injury with a red, pink wound bed without slough. (Active)  05/12/23 0400  Location: Coccyx  Location Orientation: Mid  Staging: Stage 2 -  Partial thickness loss of dermis presenting as a shallow open injury with a red, pink wound bed without slough.  Wound Description (Comments):   DO NOT USE:  Present on Admission: Yes     Pressure Injury 05/12/23 Heel Left Deep Tissue Pressure Injury - Purple or maroon localized area of discolored intact skin or blood-filled blister due to damage of underlying soft tissue from pressure and/or shear. (Active)  05/12/23 0400  Location: Heel  Location Orientation: Left  Staging: Deep Tissue Pressure Injury - Purple or maroon localized area of discolored intact skin or blood-filled blister due to damage of underlying soft tissue from pressure and/or shear.  Wound Description (Comments):   DO NOT USE:  Present on Admission: Yes        [place]  [facility] If 7PM-7AM, please contact night-coverage at [email or web address], Office  [phone]   11/08/2023, 2:43 PM  LOS: 9 days

## 2023-11-08 NOTE — Evaluation (Signed)
 Physical Therapy Evaluation Patient Details Name: David [name] MRN: [id number] DOB: December 18, 1945 Today's Date: 11/08/2023  History of Present Illness  Pt is a 78 y.o. male admitted 10/29/23 for Acute on Chronic combined systolic/diastolic CHF exacerbation with associate acute hypoxic respiratory failure. Past medical history significant of CAD s/p stent, Combined systolic diastolic heart failure, CKD, GERD, HTN , anemia of chronic illness, DM II, neuropathy  Clinical Impression  Pt admitted with above diagnosis.  Pt currently with functional limitations due to the deficits listed below (see PT Problem List). Pt will benefit from acute skilled PT to increase their independence and safety with mobility to allow discharge.  Pt reports he was at [facility] for months (since at least Jan) and had signed himself out to return home however only home for one day prior to this admission.  Pt states he has not attempted any OOB activity or sitting EOB since admission (10 days ago) and was just starting to stand and use parallel bars and RW with PT at SNF prior to going home.  Pt utilizing w/c for home.  Pt agreeable to return to SNF setting for rehab however states he will not return to [facility].  Pt currently requiring increased assist for bed mobility and presents with generalized weakness, decreased balance and deconditioning.  Pt assisted with sitting EOB and performed a couple LE exercises.  Pt repositioned in bed upon return to supine (reapplied Prevalon boots and checked with RN to tuck pillow under right hip).          If plan is discharge home, recommend the following: Two people to help with walking and/or transfers;A lot of help with bathing/dressing/bathroom   Can travel by private vehicle   No    Equipment Recommendations Hospital bed;Hoyer lift  Recommendations for Other Services       Functional Status Assessment Patient has had a recent decline in their functional status and  demonstrates the ability to make significant improvements in function in a reasonable and predictable amount of time.     Precautions / Restrictions Precautions Precautions: Fall Precaution/Restrictions Comments: pt reports 2L O2 [place] baseline, wears left knee sleeve/brace, Prevalon boots for bil heel pressure injuries      Mobility  Bed Mobility Overal bed mobility: Needs Assistance Bed Mobility: Rolling, Sidelying to Sit, Sit to Supine Rolling: Used rails, Mod assist, +2 for physical assistance Sidelying to sit: Max assist, +2 for physical assistance       General bed mobility comments: pt attempting to assist with upper body upon supine to sit however requiring more assist for return to bed - controlling trunk descent and assist for bil LEs    Transfers                   General transfer comment: NT at this time; pt has been in bed for at least 10 days (reports no OOB activity since admission) also receiving PRBCs during session    Ambulation/Gait                  Stairs            Wheelchair Mobility     Tilt Bed    Modified Rankin (Stroke Patients Only)       Balance Overall balance assessment: Needs assistance Sitting-balance support: Bilateral upper extremity supported, Feet supported Sitting balance-Leahy Scale: Poor Sitting balance - Comments: reliant on UE support; unable to sit without UE support (tends to lean to right side in  bed per pt and also observed more right forearm lean/propping with fatigue in sitting) Postural control: Right lateral lean                                   Pertinent Vitals/Pain Pain Assessment Pain Assessment: Faces Faces Pain Scale: Hurts little more Pain Location: left knee, LEs with movement Pain Descriptors / Indicators: Grimacing, Guarding, Sore (stiff) Pain Intervention(s): Repositioned, Monitored during session    Home Living Family/patient expects to be discharged to:: Skilled  nursing facility Living Arrangements: Spouse/significant other Available Help at Discharge: Family;Available PRN/intermittently Type of Home: House Home Access: Stairs to enter Entrance Stairs-Rails: Right;Left Entrance Stairs-Number of Steps: 3   Home Layout: Two level;Able to live on main level with bedroom/bathroom Home Equipment: Rolling Walker (2 wheels);Cane - single point;Shower seat;Wheelchair - manual      Prior Function Prior Level of Function : Needs assist             Mobility Comments: reports he had been in SNF since Dec/Jan and had signed himself out to go home, was home for one day and then admitted; had only just started standing and taking step in parallel bars with PT at SNF prior to return home - w/c for home       Extremity/Trunk Assessment   Upper Extremity Assessment Upper Extremity Assessment: RUE deficits/detail;LUE deficits/detail RUE Deficits / Details: wears gloves bilaterally due to neuropathy    Lower Extremity Assessment Lower Extremity Assessment: Generalized weakness;LLE deficits/detail (denies neuropathy in LEs ? but not able to perform bil active ankle movement) LLE Deficits / Details: reports decreased strength and pain in L LE since knee injury (sliding out of car returning home from SNF per his report)       Communication   Communication Communication: Impaired Factors Affecting Communication: Other (comment) (R eye blindness per pt, tends to blink a lot or keep eyelid closed)    Cognition Arousal: Alert Behavior During Therapy: WFL for tasks assessed/performed   PT - Cognitive impairments: No apparent impairments                         Following commands: Intact       Cueing       General Comments      Exercises General Exercises - Lower Extremity Ankle Circles/Pumps: Limitations, Other (comment) Ankle Circles/Pumps Limitations: actively unable to perform ankle pumps Short Arc Quad: AROM, Both, 10 reps,  Seated Hip Flexion/Marching: AROM, 10 reps, Limitations, Both Hip Flexion/Marching Limitations: limited active   Assessment/Plan    PT Assessment Patient needs continued PT services  PT Problem List Decreased strength;Decreased coordination;Impaired sensation;Decreased skin integrity;Decreased knowledge of use of DME;Decreased mobility;Decreased balance;Decreased activity tolerance       PT Treatment Interventions Gait training;DME instruction;Balance training;Neuromuscular re-education;Patient/family education;Functional mobility training;Wheelchair mobility training;Therapeutic exercise;Therapeutic activities    PT Goals (Current goals can be found in the Care Plan section)  Acute Rehab PT Goals PT Goal Formulation: With patient/family Time For Goal Achievement: 11/22/23 Potential to Achieve Goals: Fair    Frequency Min 2X/week     Co-evaluation               AM-PAC PT 6 Clicks Mobility  Outcome Measure Help needed turning from your back to your side while in a flat bed without using bedrails?: A Lot Help needed moving from lying on your back to  sitting on the side of a flat bed without using bedrails?: Total Help needed moving to and from a bed to a chair (including a wheelchair)?: Total Help needed standing up from a chair using your arms (e.g., wheelchair or bedside chair)?: Total Help needed to walk in hospital room?: Total Help needed climbing 3-5 steps with a railing? : Total 6 Click Score: 7    End of Session Equipment Utilized During Treatment: Oxygen (remained on baseline 2L O2 [place], denies dizziness or SOB with session) Activity Tolerance: Patient tolerated treatment well Patient left: in bed;with call bell/phone within reach;with family/visitor present   PT Visit Diagnosis: Muscle weakness (generalized) (M62.81);Other abnormalities of gait and mobility (R26.89)    Time: 8579-8545 PT Time Calculation (min) (ACUTE ONLY): 34 min   Charges:   PT  Evaluation $PT Eval Moderate Complexity: 1 Mod PT Treatments $Therapeutic Activity: 8-22 mins PT General Charges $$ ACUTE PT VISIT: 1 Visit       Tari PT, DPT Physical Therapist Acute Rehabilitation Services Office: [phone]   Tari CROME [place] 11/08/2023, 4:32 PM

## 2023-11-08 NOTE — Progress Notes (Signed)
 David [name] Cha 10:38 AM  Subjective: Patient doing well without any significant GI complaints or problems from his endoscopy and his case discussed with his wife and the hospital team and has been on Bentyl  at home which has not helped his abdominal spasms and he seems to only get that when he is scared or meet strangers or was in a rush and we talked about how he needed a colonoscopy as an inpatient or outpatient just to be complete and they would like to continue to think about that and we answered all of his and his wife's questions  Objective: Vital signs stable afebrile no acute distress patient looks well in good spirits not examined today BUN and creatinine slight decrease hemoglobin slight decrease and again he is not iron  deficient  Assessment: Multiple medical problems including chronic anemia  Plan: Please let my partner Dr. Dianna know if they would like inpatient colonoscopy otherwise I will follow-up the biopsies and he can follow-up with his [facility] and continue Protonix  long-term particularly as long as he is on an aspirin  a day and please call us  if we can be of any further assistance with this hospital stay  [facility]  office [phone] After 5PM or if no answer call [phone]

## 2023-11-09 DIAGNOSIS — J189 Pneumonia, unspecified organism: Secondary | ICD-10-CM | POA: Diagnosis not present

## 2023-11-09 DIAGNOSIS — I5043 Acute on chronic combined systolic (congestive) and diastolic (congestive) heart failure: Secondary | ICD-10-CM | POA: Diagnosis not present

## 2023-11-09 DIAGNOSIS — I509 Heart failure, unspecified: Secondary | ICD-10-CM | POA: Diagnosis not present

## 2023-11-09 LAB — TYPE AND SCREEN
ABO/RH(D): O POS
Antibody Screen: NEGATIVE
Unit division: 0

## 2023-11-09 LAB — BPAM RBC
Blood Product Expiration Date: 202507172359
ISSUE DATE / TIME: 202506221349
Unit Type and Rh: 5100

## 2023-11-09 LAB — GLUCOSE, CAPILLARY
Glucose-Capillary: 149 mg/dL — ABNORMAL HIGH (ref 70–99)
Glucose-Capillary: 220 mg/dL — ABNORMAL HIGH (ref 70–99)
Glucose-Capillary: 206 mg/dL — ABNORMAL HIGH (ref 70–99)
Glucose-Capillary: 218 mg/dL — ABNORMAL HIGH (ref 70–99)

## 2023-11-09 LAB — CBC
HCT: 23.5 % — ABNORMAL LOW (ref 39.0–52.0)
Hemoglobin: 7.4 g/dL — ABNORMAL LOW (ref 13.0–17.0)
MCH: 32.6 pg (ref 26.0–34.0)
MCHC: 31.5 g/dL (ref 30.0–36.0)
MCV: 103.5 fL — ABNORMAL HIGH (ref 80.0–100.0)
Platelets: 207 10*3/uL (ref 150–400)
RBC: 2.27 MIL/uL — ABNORMAL LOW (ref 4.22–5.81)
RDW: 22 % — ABNORMAL HIGH (ref 11.5–15.5)
WBC: 7.7 10*3/uL (ref 4.0–10.5)
nRBC: 0 % (ref 0.0–0.2)

## 2023-11-09 LAB — OCCULT BLOOD X 1 CARD TO LAB, STOOL: Fecal Occult Bld: NEGATIVE

## 2023-11-09 LAB — BASIC METABOLIC PANEL WITH GFR
Anion gap: 7 (ref 5–15)
BUN: 40 mg/dL — ABNORMAL HIGH (ref 8–23)
CO2: 26 mmol/L (ref 22–32)
Calcium: 8.2 mg/dL — ABNORMAL LOW (ref 8.9–10.3)
Chloride: 103 mmol/L (ref 98–111)
Creatinine, Ser: 2.44 mg/dL — ABNORMAL HIGH (ref 0.61–1.24)
GFR, Estimated: 27 mL/min — ABNORMAL LOW (ref >60)
Glucose, Bld: 175 mg/dL — ABNORMAL HIGH (ref 70–99)
Potassium: 3.8 mmol/L (ref 3.5–5.1)
Sodium: 136 mmol/L (ref 135–145)

## 2023-11-09 MED ORDER — ONDANSETRON HCL 4 MG/2ML IJ SOLN
4.0000 mg | Freq: Four times a day (QID) | INTRAMUSCULAR | Status: DC | PRN
  Filled 2023-11-09: qty 2

## 2023-11-09 MED ORDER — METOCLOPRAMIDE HCL 5 MG/ML IJ SOLN
5.0000 mg | Freq: Four times a day (QID) | INTRAMUSCULAR | Status: DC
  Administered 2023-11-09 – 2023-11-25 (×55): 5 mg via INTRAVENOUS
  Filled 2023-11-09 (×58): qty 2

## 2023-11-09 NOTE — Progress Notes (Signed)
 Notified by tele tech of 2 second pause and brief period of 2nd degree heart block.  Patient asymptomatic.  VS obtained and charted.  On call provider, Lynwood Kipper, NP, notified.

## 2023-11-09 NOTE — Evaluation (Signed)
 Occupational Therapy Evaluation Patient Details Name: David [name] MRN: [id number] DOB: 08-24-1945 Today's Date: 11/09/2023   History of Present Illness   Pt is a 78 y.o. male admitted 10/29/23 for Acute on Chronic combined systolic/diastolic CHF exacerbation with associate acute hypoxic respiratory failure. Past medical history significant of CAD s/p stent, Combined systolic diastolic heart failure, CKD, GERD, HTN , anemia of chronic illness, DM II, neuropathy     Clinical Impressions PTA, patient had been working with therapy at SNF from prior hospital admission early 2025 with PT and OT progressing to standing and short distance amb in parallel bars until patient signed himself out and was only home for 1 day home with wife then this hospital admission. Currently, patient presents with deficits outlined below (see OT Problem List for details) most significantly pain and nausea this visit, decreased skin integrity, generalized muscle weakness, balance and activity tolerance deficits limiting BADL's and functional mobility. Patient requires skilled OT services while in Acute hospital setting to progress OOB tolerance and functional status and allow for discharge. Patient will benefit from continued inpatient follow up therapy, <3 hours/day.       If plan is discharge home, recommend the following:   Two people to help with walking and/or transfers;Two people to help with bathing/dressing/bathroom;Assistance with cooking/housework;Assistance with feeding;Direct supervision/assist for medications management;Direct supervision/assist for financial management;Assist for transportation;Help with stairs or ramp for entrance     Functional Status Assessment   Patient has had a recent decline in their functional status and demonstrates the ability to make significant improvements in function in a reasonable and predictable amount of time.     Equipment Recommendations   None recommended  by OT (tbd post rehab venue)      Precautions/Restrictions   Precautions Precautions: Fall Precaution/Restrictions Comments: pt reports 2L O2 [place] baseline, wears left knee sleeve/brace, Prevalon boots for bil heel pressure injuries Restrictions Weight Bearing Restrictions Per Provider Order: No     Mobility Bed Mobility Overal bed mobility: Needs Assistance Bed Mobility: Rolling, Sidelying to Sit, Sit to Supine Rolling: Mod assist, +2 for physical assistance, +2 for safety/equipment, Used rails Sidelying to sit: Max assist, +2 for physical assistance, +2 for safety/equipment, HOB elevated, Used rails       General bed mobility comments: PRAFOs in place for protection of B LE wounds and thicvk work gloves on hands for pain management    Transfers Overall transfer level:  (patient with significant nausea and pain given morphine  prior but still declining OOB)                        Balance Overall balance assessment: Needs assistance Sitting-balance support: Bilateral upper extremity supported, Feet supported Sitting balance-Leahy Scale: Poor Sitting balance - Comments: significant R lateral lean supported and unsupported Postural control: Right lateral lean                                 ADL either performed or assessed with clinical judgement   ADL Overall ADL's : Needs assistance/impaired Eating/Feeding: Minimal assistance;Bed level   Grooming: Wash/dry hands;Wash/dry face;Bed level;Minimal assistance   Upper Body Bathing: Bed level;Moderate assistance   Lower Body Bathing: Total assistance;+2 for physical assistance;+2 for safety/equipment;Bed level   Upper Body Dressing : Moderate assistance Upper Body Dressing Details (indicate cue type and reason): excluding fasteners Lower Body Dressing: Total assistance;+2 for physical assistance;+2 for safety/equipment;Bed level  Functional mobility during ADLs: +2 for physical  assistance;+2 for safety/equipment;Maximal assistance (EOB) General ADL Comments: hand pain with need for gloves impacts all B hand use, will trial with built up utensils     Vision Baseline Vision/History:  (R eye blindness) Ability to See in Adequate Light: 1 Impaired Patient Visual Report: No change from baseline Vision Assessment?: Wears glasses for reading     Perception Perception: Impaired Preception Impairment Details: Figure ground         Pertinent Vitals/Pain Pain Assessment Pain Assessment: 0-10 Pain Score: 6  Pain Location: everywhare Pain Descriptors / Indicators: Grimacing, Guarding, Sore Pain Intervention(s): Limited activity within patient's tolerance, Monitored during session, Repositioned, Premedicated before session, Relaxation, Other (comment) (uses B work gloves for pain management of hand pain)     Extremity/Trunk Assessment Upper Extremity Assessment Upper Extremity Assessment: Right hand dominant   Lower Extremity Assessment Lower Extremity Assessment: Generalized weakness;Defer to PT evaluation   Cervical / Trunk Assessment Cervical / Trunk Assessment: Normal   Communication Communication Communication: Impaired;No apparent difficulties   Cognition Arousal: Alert Behavior During Therapy: WFL for tasks assessed/performed Cognition: No apparent impairments                               Following commands: Intact       Cueing  General Comments   Cueing Techniques: Verbal cues  discoloration B LE's from knees down, no edema preset, B PRAFO's in place with B forefoot wounds banadaged and heels with protectors in place c/d/i           Home Living Family/patient expects to be discharged to:: Skilled nursing facility Living Arrangements: Spouse/significant other Available Help at Discharge: Family;Available PRN/intermittently Type of Home: House Home Access: Stairs to enter [employer] of Steps: 3 Entrance  Stairs-Rails: Right;Left Home Layout: Two level;Able to live on main level with bedroom/bathroom Alternate Level Stairs-Number of Steps: full flight, typically backs up the stairs on his bottom Alternate Level Stairs-Rails: Right           Home Equipment: Rolling Walker (2 wheels);Cane - single point;Shower seat;Wheelchair - manual   Additional Comments: wife is retired now      Prior Functioning/Environment Prior Level of Function : Needs assist       Physical Assist : Mobility (physical);ADLs (physical) Mobility (physical): Transfers   Mobility Comments: reports he had been in SNF since Dec/Jan and had signed himself out to go home, was home for one day and then admitted; had only just started standing and taking step in parallel bars with PT at SNF prior to return home - w/c for home ADLs Comments: wife is limited at what physical assist she can provide    OT Problem List: Decreased strength;Decreased activity tolerance;Impaired balance (sitting and/or standing);Impaired vision/perception;Decreased coordination;Decreased knowledge of use of DME or AE;Decreased knowledge of precautions;Cardiopulmonary status limiting activity;Impaired sensation;Impaired UE functional use;Pain   OT Treatment/Interventions: Self-care/ADL training;Therapeutic exercise;Neuromuscular education;Energy conservation;DME and/or AE instruction;Manual therapy;Therapeutic activities;Patient/family education;Balance training      OT Goals(Current goals can be found in the care plan section)   Acute Rehab OT Goals Patient Stated Goal: to walk OT Goal Formulation: With patient Time For Goal Achievement: 11/23/23 Potential to Achieve Goals: Fair ADL Goals Pt Will Perform Eating: with set-up;with adaptive utensils;sitting Pt Will Perform Grooming: with set-up;with adaptive equipment;sitting Pt Will Perform Upper Body Bathing: with set-up;sitting Pt Will Perform Upper Body Dressing: with min  assist;sitting Pt  Will Transfer to Toilet: with max assist;with +2 assist;bedside commode   OT Frequency:  Min 2X/week       AM-PAC OT 6 Clicks Daily Activity     Outcome Measure Help from another person eating meals?: A Lot Help from another person taking care of personal grooming?: A Lot Help from another person toileting, which includes using toliet, bedpan, or urinal?: A Lot Help from another person bathing (including washing, rinsing, drying)?: A Lot Help from another person to put on and taking off regular upper body clothing?: A Lot Help from another person to put on and taking off regular lower body clothing?: A Lot 6 Click Score: 12   End of Session Nurse Communication: Mobility status;Other (comment) (nursing assisted with bed mobility and positioning)  Activity Tolerance: Patient limited by fatigue;Patient limited by pain;Other (comment) (nausea) Patient left: in bed;with call bell/phone within reach;with bed alarm set  OT Visit Diagnosis: Other abnormalities of gait and mobility (R26.89);Muscle weakness (generalized) (M62.81);Pain                Time: 8849-8772 OT Time Calculation (min): 37 min Charges:  OT General Charges $OT Visit: 1 Visit OT Evaluation $OT Eval Low Complexity: 1 Low OT Treatments $Therapeutic Activity: 8-22 mins  Kavin Weckwerth OT/L Acute Rehabilitation Department  [phone]  11/09/2023, 12:54 PM

## 2023-11-09 NOTE — Progress Notes (Signed)
 Woke patient up to do wound care to bilateral feet. Patient refuses at this time, kindly requests for it to be done after breakfast for he wishes to get more sleep.

## 2023-11-09 NOTE — Progress Notes (Signed)
 [facility]  PROGRESS NOTE  ZARIAH JOST FMW:[id number] DOB: 12/19/1945 DOA: 10/29/2023 PCP: Rexanne Ingle, MD   Brief HPI:   79 y.o. male with medical history significant of  CAD s/p stent, Combined systolic diastolic heart failure, CKDIII, DmII, GERD, HTN  who presents to ED BIBEMS  with complaint of sob x 2 days.  ON arrival EMS placed patient on NRB and transported to ED. patient was admitted to the hospital in April for symptomatic anemia transferred 2 units of blood.  It was deemed to be related to anemia of chronic disease.  Patient was admitted for the treatment of CHF and pneumonia. Chest x-ray showed cardiomegaly vascular congestion and small right pleural effusion with CT of the chest shows negative for PE, shows pulmonary edema, pneumonia in the right lower lobe.  Pulmonary hypertension.    Assessment/Plan:   Acute on Chronic combined systolic/diastolic  CHF exacerbation  with associate acute hypoxic respiratory failure -Lasix  has been on hold due to creatinine trending up.   - Was started on Lasix , creatinine up to 3.42.  Lasix  currently on hold  Last echo from December 2024 with a EF of 45 to 40 to 45% noted. CT chest on admission showed no evidence of acute pulmonary embolism, CHF with pulmonary edema and moderate right and small left pleural effusion, superimposed pneumonia in the right lower lobe not excluded, dilated main pulmonary artery. -Started back on torsemide  20 mg daily    CAP presumed bacterial  Completed 5 days of  Rocephin  azithromycin .   RSV COVID and flu negative. On 4 L of oxygen saturating 99%. Encourage incentive spirometry. Continue neb treatments. Taper oxygen to baseline 2 L as tolerated and able.   Abnormal Cardiac Enzyme mildly elevated troponin at 32 -presumed due to demand Follow-up echo this admission 35 to 40% EF  Epigastric discomfort/anxiety - Will start Bentyl  10 mg p.o. 3 times daily with meals -Discontinue Amitiza  - Started  on Zoloft  50 mg daily   Macrocytic anemia -Hemoglobin dropped to 6.0 on 11/04/2023 -Status post 1 unit PRBC -Hemoglobin is 7.4 today -GI consulted, underwent EGD, which showed nonbleeding gastric ulcer, mucosal changes in duodenum; biopsies obtained - Plan to follow-up with GI as outpatient.  Patient to call GI for biopsy result in 5 days - Patient has been refusing colonoscopy at this time.    Hypertension improved  -continue metoprolol , Bidil     AKI On ckd stage IV-creatinine bumped to 3.58 from 2.06 from 1.97   -Creatinine has improved to 2.39 Started on torsemide  Follow BMP in am    CAD s/p stent  -continue beta-blocker, aspirin  and statin. -no active issues    DMII  hemoglobin A1c 6.0  on Lantus  at home      Chronic diabetic foot ulcer b/l  - Per wound care Unstageable Pressure Injury right and left heel; left and right lateral feet'; all are 100% eschar  Arterial ulceration left medial great toe; 100% clean Pressure Injury POA: Yes Cleanse all foot wounds with saline pat dry Paint all foot wounds with betadine, allow to air dry. Ok to cover with foam. Reapply betadine daily.  Offload heels at all times with prevalon boots Single layer of xeroform to the great toe wound, top with dry dressing or bandaid. Change daily Patient was supposed to follow-up with vascular surgery after discharge however he did not follow-up with them.  ABI normal on the right and moderate left lower extremity arterial disease.    he was due to  have an appointment with vascular as an outpatient but he did not follow-up on the appointment.         Pressure Injury 05/12/23 Coccyx Mid Stage 2 -  Partial thickness loss of dermis presenting as a shallow open injury with a red, pink wound bed without slough. (Active)  05/12/23 0400  Location: Coccyx  Location Orientation: Mid  Staging: Stage 2 -  Partial thickness loss of dermis presenting as a shallow open injury with a red, pink wound bed  without slough.  Wound Description (Comments):   DO NOT USE:  Present on Admission: Yes  Dressing Type Foam - Lift dressing to assess site every shift 11/02/23 0800     Pressure Injury 05/12/23 Heel Left Deep Tissue Pressure Injury - Purple or maroon localized area of discolored intact skin or blood-filled blister due to damage of underlying soft tissue from pressure and/or shear. (Active)  05/12/23 0400  Location: Heel  Location Orientation: Left  Staging: Deep Tissue Pressure Injury - Purple or maroon localized area of discolored intact skin or blood-filled blister due to damage of underlying soft tissue from pressure and/or shear.  Wound Description (Comments):   DO NOT USE:  Present on Admission: Yes  Dressing Type None 11/02/23 0800      Estimated body mass index is 24.64 kg/m as calculated from the following:   Height as of this encounter: 6' 2.02 (1.88 m).   Weight as of this encounter: 87.1 kg.      Medications     bisacodyl   10 mg Rectal Once   dicyclomine   10 mg Oral TID AC   docusate sodium   100 mg Oral Daily   doxycycline   100 mg Oral Q12H   feeding supplement  237 mL Oral BID BM   insulin  aspart  0-6 Units Subcutaneous TID WC   isosorbide -hydrALAZINE   2 tablet Oral TID   latanoprost   1 drop Both Eyes QHS   pantoprazole   40 mg Oral Daily   senna-docusate  2 tablet Oral QHS   sertraline   25 mg Oral Daily   torsemide   20 mg Oral Daily   traZODone   100 mg Oral QHS     Data Reviewed:   CBG:  Recent Labs  Lab 11/07/23 2308 11/08/23 1100 11/08/23 1625 11/08/23 2131 11/09/23 0728  GLUCAP 181* 139* 211* 213* 149*    SpO2:  (pt. refused) O2 Flow Rate (L/min): 2 L/min    Vitals:   11/08/23 1410 11/08/23 1632 11/08/23 2131 11/09/23 0247  BP: (!) 143/67 (!) 169/91 (!) 152/111 131/68  Pulse: 69 73 80 (!) 57  Resp: 16 16 16 16   Temp: 98.1 F (36.7 C) 98.1 F (36.7 C) 99.1 F (37.3 C) 98.9 F (37.2 C)  TempSrc: Oral Oral Oral Oral  SpO2: 100% 100%  100% 100%  Weight:      Height:          Data Reviewed:  Basic Metabolic Panel: Recent Labs  Lab 11/05/23 0843 11/06/23 0951 11/07/23 0419 11/08/23 0357 11/09/23 0344  NA 137 138 137 137 136  K 4.1 4.1 3.9 3.9 3.8  CL 106 103 103 103 103  CO2 25 25 24 25 26   GLUCOSE 119* 130* 142* 142* 175*  BUN 31* 44* 43* 40* 40*  CREATININE 3.47* 2.91* 2.67* 2.39* 2.44*  CALCIUM  8.5* 8.7* 8.7* 8.4* 8.2*    CBC: Recent Labs  Lab 11/05/23 0843 11/06/23 0407 11/07/23 0419 11/08/23 0357 11/09/23 0344  WBC 8.0 7.4 9.5 7.5 7.7  HGB 8.0* 7.1* 7.6* 6.8* 7.4*  HCT 26.3* 22.9* 24.8* 22.2* 23.5*  MCV 109.6* 107.0* 108.8* 108.8* 103.5*  PLT 237 204 225 214 207    LFT Recent Labs  Lab 11/04/23 0419 11/05/23 0843  AST 23 23  ALT 12 12  ALKPHOS 80 89  BILITOT 0.8 0.9  PROT 6.6 7.6  ALBUMIN  2.3* 2.6*     Antibiotics: Anti-infectives (From admission, onward)    Start     Dose/Rate Route Frequency Ordered Stop   11/01/23 2200  doxycycline  (VIBRA -TABS) tablet 100 mg        100 mg Oral Every 12 hours 11/01/23 0838     10/30/23 2200  cefTRIAXone  (ROCEPHIN ) 2 g in sodium chloride  0.9 % 100 mL IVPB        2 g 200 mL/hr over 30 Minutes Intravenous Every 24 hours 10/30/23 0007 11/03/23 2258   10/30/23 1000  doxycycline  (VIBRAMYCIN ) 100 mg in sodium chloride  0.9 % 250 mL IVPB  Status:  Discontinued        100 mg 125 mL/hr over 120 Minutes Intravenous Every 12 hours 10/30/23 0007 11/01/23 0838   10/29/23 2130  cefTRIAXone  (ROCEPHIN ) 1 g in sodium chloride  0.9 % 100 mL IVPB        1 g 200 mL/hr over 30 Minutes Intravenous  Once 10/29/23 2124 10/29/23 2214   10/29/23 2130  azithromycin  (ZITHROMAX ) 500 mg in sodium chloride  0.9 % 250 mL IVPB        500 mg 250 mL/hr over 60 Minutes Intravenous  Once 10/29/23 2124 10/30/23 0012        DVT prophylaxis: Heparin   Code Status: Full code  Family Communication: No family at bedside   CONSULTS    Subjective   Complains of nausea  this morning.  Objective    Physical Examination:  General-appears in no acute distress Heart-S1-S2, regular, no murmur auscultated Lungs-clear to auscultation bilaterally, no wheezing or crackles auscultated Abdomen-soft, nontender, no organomegaly Extremities-no edema in the lower extremities Neuro-alert, oriented x3, no focal deficit noted  Status is: Inpatient:      Pressure Injury 05/12/23 Coccyx Mid Stage 2 -  Partial thickness loss of dermis presenting as a shallow open injury with a red, pink wound bed without slough. (Active)  05/12/23 0400  Location: Coccyx  Location Orientation: Mid  Staging: Stage 2 -  Partial thickness loss of dermis presenting as a shallow open injury with a red, pink wound bed without slough.  Wound Description (Comments):   DO NOT USE:  Present on Admission: Yes     Pressure Injury 05/12/23 Heel Left Deep Tissue Pressure Injury - Purple or maroon localized area of discolored intact skin or blood-filled blister due to damage of underlying soft tissue from pressure and/or shear. (Active)  05/12/23 0400  Location: Heel  Location Orientation: Left  Staging: Deep Tissue Pressure Injury - Purple or maroon localized area of discolored intact skin or blood-filled blister due to damage of underlying soft tissue from pressure and/or shear.  Wound Description (Comments):   DO NOT USE:  Present on Admission: Yes        Paislee Szatkowski S Reon Hunley   [facility] If 7PM-7AM, please contact night-coverage at [email or web address], Office  [phone]   11/09/2023, 7:43 AM  LOS: 10 days

## 2023-11-09 NOTE — TOC Progression Note (Signed)
 Transition of Care [facility]) - Progression Note    Patient Details  Name: David [name] MRN: [id number] Date of Birth: 27-Mar-1946  [facility]) CM/SW Contact  Heather DELENA Saltness, LCSW Phone Number: 11/09/2023, 11:45 AM  Clinical Narrative:    Pt recommended for short-term SNF placement by PT. CSW met with pt and spouse at bedside to discuss discharge plan. Pt confirmed he is agreeable to go to SNF upon discharge. Pt stated I'm open to go to SNF, but I do not want to go back to the place I came from [facility]). Per pt's request, CSW sent referral to SNFs in [place] and surrounding areas (besides [facility]). CSW will present bed offers to pt tomorrow. TOC will continue to follow.     Barriers to Discharge: Continued Medical Work up  Expected Discharge Plan and Services SNF   Social Determinants of Health (SDOH) Interventions SDOH Screenings   Food Insecurity: No Food Insecurity (10/30/2023)  Housing: Low Risk  (10/30/2023)  Transportation Needs: No Transportation Needs (10/30/2023)  Utilities: Not At Risk (10/30/2023)  Alcohol Screen: Low Risk  (05/18/2023)  Financial Resource Strain: Low Risk  (05/18/2023)  Physical Activity: Inactive (08/20/2017)  Social Connections: Socially Integrated (10/30/2023)  Stress: No Stress Concern Present (08/20/2017)  Tobacco Use: Medium Risk (11/07/2023)    Readmission Risk Interventions    11/09/2023   11:30 AM 11/06/2023    8:44 AM  Readmission Risk Prevention Plan  Transportation Screening Complete Complete  Medication Review (RN Care Manager) Complete Complete  PCP or Specialist appointment within 3-5 days of discharge Complete   HRI or Home Care Consult Complete Complete  SW Recovery Care/Counseling Consult Complete Complete  Palliative Care Screening Not Applicable Not Applicable  Skilled Nursing Facility Complete Not Applicable    Heather Saltness, MSW, LCSW 11/09/2023 11:49 AM

## 2023-11-09 NOTE — NC FL2 (Signed)
 [place]  MEDICAID FL2 LEVEL OF CARE FORM     IDENTIFICATION  Patient Name: David [name] Birthdate: [birth year] Sex: male Admission Date (Current Location): 10/29/2023  [facility] and [place] Number:  Producer, television/film/video and Address:  [facility],  [address] [place], [place] [address]      Provider Number: [id number]  Attending Physician Name and Address:  Drusilla Sabas RAMAN, MD  Relative Name and Phone Number:  Gerlean Cohens [facility])  [phone]    Current Level of Care: Hospital Recommended Level of Care: Skilled Nursing Facility Prior Approval Number:    Date Approved/Denied:   PASRR Number: [id number]  Discharge Plan: SNF    Current Diagnoses: Patient Active Problem List   Diagnosis Date Noted   Symptomatic anemia 09/04/2023   Chronic combined systolic and diastolic congestive heart failure (HCC) 09/04/2023   CHF (congestive heart failure) (HCC) 05/12/2023   Malnutrition of moderate degree 05/06/2023   Diabetic foot infection (HCC) 05/05/2023   Sacral wound 05/05/2023   Pleural effusion on right 05/05/2023   Elevated TSH 05/05/2023   Acute on chronic diastolic (congestive) heart failure (HCC) 05/05/2023   Diastolic CHF (HCC) 05/04/2023   Class 1 obesity 06/30/2022   Acute on chronic diastolic CHF (congestive heart failure) (HCC) 06/29/2022   PAD (peripheral artery disease) (HCC) 06/13/2022   Critical limb ischemia of left lower extremity (HCC) 03/21/2021   Nonhealing ulcer of left lower extremity limited to breakdown of skin (HCC) 03/21/2021   Abscess of left foot 08/03/2020   Osteomyelitis of fifth toe of left foot (HCC)    Mixed hyperlipidemia 10/15/2018   Bilateral leg edema 09/17/2018   Stage 3b chronic kidney disease (HCC) 09/10/2018   Anemia 09/10/2018   Pain and swelling of wrist, right 08/14/2018   Bilateral carotid artery stenosis 07/30/2018   Coronary artery disease involving native coronary artery of native heart without angina  pectoris 07/30/2018   Snoring 02/11/2018   Chronic heart failure with preserved ejection fraction (HFpEF) (HCC) 12/22/2017   At risk for adverse drug reaction 12/15/2017   Chronic pain syndrome 12/15/2017   Status post coronary artery stent placement    Pneumonia due to infectious organism 03/30/2017   Type 2 diabetes mellitus with hyperlipidemia (HCC) 03/29/2017   Acute respiratory failure with hypoxemia (HCC) 03/29/2017   Acute on chronic respiratory failure with hypoxia (HCC)    Pulmonary congestion    Acquired contracture of Achilles tendon, right 08/19/2016   Amputated great toe, right (HCC) 07/18/2016   Onychomycosis 05/29/2016   Diabetic polyneuropathy associated with type 2 diabetes mellitus (HCC) 04/09/2016   Right foot ulcer, limited to breakdown of skin (HCC) 04/09/2016   Spinal stenosis of lumbar region without neurogenic claudication 05/24/2014   Hereditary and idiopathic peripheral neuropathy 09/15/2013   Malaise 08/14/2013   Essential hypertension 08/14/2013   Chronic back pain 08/14/2013   Glaucoma 08/14/2013   Headache 08/14/2013    Orientation RESPIRATION BLADDER Height & Weight     Self, Time, Situation, Place  Normal Incontinent Weight: 201 lb 15.1 oz (91.6 kg) Height:  6' 2 (188 cm)  BEHAVIORAL SYMPTOMS/MOOD NEUROLOGICAL BOWEL NUTRITION STATUS      Continent Diet (soft diet)  AMBULATORY STATUS COMMUNICATION OF NEEDS Skin   Extensive Assist Verbally Other (Comment) (Pressure Injury 05/12/23 Coccyx Mid Stage 2 -  Partial thickness, Pressure Injury 05/12/23 Heel Left Deep Tissue Pressure Injury, and Wound / Incision (Open or Dehisced) 05/23/23 Foot Anterior;Right;Upper)  Personal Care Assistance Level of Assistance  Bathing, Feeding, Dressing Bathing Assistance: Maximum assistance Feeding assistance: Independent Dressing Assistance: Maximum assistance     Functional Limitations Info  Sight, Hearing, Speech Sight Info: Impaired  (eye glasses; blind in right eye) Hearing Info: Adequate Speech Info: Adequate    SPECIAL CARE FACTORS FREQUENCY  PT (By licensed PT), OT (By licensed OT)     PT Frequency: 5x per week OT Frequency: 5x per week            Contractures Contractures Info: Not present    Additional Factors Info  Code Status, Allergies Code Status Info: FULL Allergies Info: Nsaids           Current Medications (11/09/2023):  This is the current hospital active medication list Current Facility-Administered Medications  Medication Dose Route Frequency Provider Last Rate Last Admin   acetaminophen  (TYLENOL ) tablet 650 mg  650 mg Oral Q6H PRN Rosalie Kitchens, MD       Or   acetaminophen  (TYLENOL ) suppository 650 mg  650 mg Rectal Q6H PRN Rosalie Kitchens, MD       albuterol  (PROVENTIL ) (2.5 MG/3ML) 0.083% nebulizer solution 2.5 mg  2.5 mg Nebulization Q2H PRN Rosalie Kitchens, MD       bisacodyl  (DULCOLAX) EC tablet 5 mg  5 mg Oral Daily PRN Rosalie Kitchens, MD       bisacodyl  (DULCOLAX) suppository 10 mg  10 mg Rectal Once Magod, Marc, MD       dicyclomine  (BENTYL ) capsule 10 mg  10 mg Oral TID AC Lama, Gagan S, MD   10 mg at 11/09/23 9062   docusate sodium  (COLACE) capsule 100 mg  100 mg Oral Daily Rosalie Kitchens, MD       doxycycline  (VIBRA -TABS) tablet 100 mg  100 mg Oral Q12H Rosalie Kitchens, MD   100 mg at 11/09/23 9061   feeding supplement (ENSURE PLUS HIGH PROTEIN) liquid 237 mL  237 mL Oral BID BM Rosalie Kitchens, MD   237 mL at 11/08/23 1058   guaiFENesin -dextromethorphan (ROBITUSSIN DM) 100-10 MG/5ML syrup 5 mL  5 mL Oral Q4H PRN Rosalie Kitchens, MD       insulin  aspart (novoLOG ) injection 0-6 Units  0-6 Units Subcutaneous TID WC Rosalie Kitchens, MD   2 Units at 11/08/23 1732   isosorbide -hydrALAZINE  (BIDIL ) 20-37.5 MG per tablet 2 tablet  2 tablet Oral TID Rosalie Kitchens, MD   2 tablet at 11/09/23 9062   latanoprost  (XALATAN ) 0.005 % ophthalmic solution 1 drop  1 drop Both Eyes QHS Rosalie Kitchens, MD   1 drop at 11/08/23 2048    menthol-cetylpyridinium (CEPACOL) lozenge 3 mg  1 lozenge Oral PRN Rosalie Kitchens, MD   3 mg at 11/03/23 9041   ondansetron  (ZOFRAN ) injection 4 mg  4 mg Intravenous Q6H PRN Lama, Gagan S, MD       pantoprazole  (PROTONIX ) EC tablet 40 mg  40 mg Oral Daily Rosalie Kitchens, MD   40 mg at 11/09/23 9062   senna-docusate (Senokot-S) tablet 2 tablet  2 tablet Oral QHS Rosalie Kitchens, MD       sertraline  (ZOLOFT ) tablet 25 mg  25 mg Oral Daily Rosalie Kitchens, MD   25 mg at 11/09/23 9061   simethicone  (MYLICON) chewable tablet 80 mg  80 mg Oral QID PRN Rosalie Kitchens, MD   80 mg at 11/06/23 1940   torsemide  (DEMADEX ) tablet 20 mg  20 mg Oral Daily Lama, Gagan S, MD   20 mg at 11/09/23 [phone]  traMADol  (ULTRAM ) tablet 50 mg  50 mg Oral TID PRN Rosalie Kitchens, MD   50 mg at 11/07/23 1549   traZODone  (DESYREL ) tablet 100 mg  100 mg Oral QHS Rosalie Kitchens, MD   100 mg at 11/08/23 2047     Discharge Medications: Please see discharge summary for a list of discharge medications.  Relevant Imaging Results:  Relevant Lab Results:   Additional Information SSN: [id number]  Heather DELENA Saltness, LCSW

## 2023-11-10 DIAGNOSIS — I5043 Acute on chronic combined systolic (congestive) and diastolic (congestive) heart failure: Secondary | ICD-10-CM | POA: Diagnosis not present

## 2023-11-10 DIAGNOSIS — J189 Pneumonia, unspecified organism: Secondary | ICD-10-CM | POA: Diagnosis not present

## 2023-11-10 DIAGNOSIS — I509 Heart failure, unspecified: Secondary | ICD-10-CM | POA: Diagnosis not present

## 2023-11-10 LAB — CBC
HCT: 25.9 % — ABNORMAL LOW (ref 39.0–52.0)
Hemoglobin: 8.1 g/dL — ABNORMAL LOW (ref 13.0–17.0)
MCH: 32.5 pg (ref 26.0–34.0)
MCHC: 31.3 g/dL (ref 30.0–36.0)
MCV: 104 fL — ABNORMAL HIGH (ref 80.0–100.0)
Platelets: 207 10*3/uL (ref 150–400)
RBC: 2.49 MIL/uL — ABNORMAL LOW (ref 4.22–5.81)
RDW: 21.7 % — ABNORMAL HIGH (ref 11.5–15.5)
WBC: 7.2 10*3/uL (ref 4.0–10.5)
nRBC: 0 % (ref 0.0–0.2)

## 2023-11-10 LAB — BASIC METABOLIC PANEL WITH GFR
Anion gap: 10 (ref 5–15)
BUN: 41 mg/dL — ABNORMAL HIGH (ref 8–23)
CO2: 27 mmol/L (ref 22–32)
Calcium: 8.5 mg/dL — ABNORMAL LOW (ref 8.9–10.3)
Chloride: 101 mmol/L (ref 98–111)
Creatinine, Ser: 2.14 mg/dL — ABNORMAL HIGH (ref 0.61–1.24)
GFR, Estimated: 31 mL/min — ABNORMAL LOW (ref >60)
Glucose, Bld: 141 mg/dL — ABNORMAL HIGH (ref 70–99)
Potassium: 4.1 mmol/L (ref 3.5–5.1)
Sodium: 138 mmol/L (ref 135–145)

## 2023-11-10 LAB — GLUCOSE, CAPILLARY
Glucose-Capillary: 130 mg/dL — ABNORMAL HIGH (ref 70–99)
Glucose-Capillary: 151 mg/dL — ABNORMAL HIGH (ref 70–99)
Glucose-Capillary: 284 mg/dL — ABNORMAL HIGH (ref 70–99)
Glucose-Capillary: 202 mg/dL — ABNORMAL HIGH (ref 70–99)

## 2023-11-10 MED ORDER — DICYCLOMINE HCL 10 MG PO CAPS
10.0000 mg | ORAL_CAPSULE | Freq: Three times a day (TID) | ORAL | Status: DC | PRN
  Administered 2023-11-10 – 2023-11-24 (×2): 10 mg via ORAL
  Filled 2023-11-10 (×2): qty 1

## 2023-11-10 NOTE — Plan of Care (Signed)
  Problem: Metabolic: Goal: Ability to maintain appropriate glucose levels will improve Outcome: Progressing   Problem: Skin Integrity: Goal: Risk for impaired skin integrity will decrease Outcome: Progressing   Problem: Activity: Goal: Risk for activity intolerance will decrease Outcome: Progressing   Problem: Nutrition: Goal: Adequate nutrition will be maintained Outcome: Progressing   

## 2023-11-10 NOTE — TOC Progression Note (Signed)
 Transition of Care [facility]) - Progression Note    Patient Details  Name: David [name] MRN: [id number] Date of Birth: 07/05/1945  Transition of Care [facility]) CM/SW Contact  Heather DELENA Saltness, LCSW Phone Number: 11/10/2023, 10:16 AM  Clinical Narrative:    CSW met with pt at bedside to discuss bed offers for short-term SNF. CSW provided pt with list of facilities with name of facility, location and Medicare Star-Rating. Pt reports None of these facilities look good. I've been to [facility] and it was bad. CSW advised pt that he can take today to review the bed offers. CSW will follow up with pt tomorrow for pt's bed choice.  Medicare Star-Rating  [facility] [address] [place], [place] [address] [phone] Overall rating ?? Below average  [facility] [address] [place], [place] [address] [phone] Overall rating ???? Below average  [facility] [address] [place], [place] [address] [phone] Overall rating ?? Much below average   [facility] [address] [place], [place] [address] [phone] Overall rating ?? Much below average  [facility] [address] [place], [place] [address] [phone] Overall rating ?? Much below average     Barriers to Discharge: Continued Medical Work up  Expected Discharge Plan and Services  SNF   Social Determinants of Health (SDOH) Interventions SDOH Screenings   Food Insecurity: No Food Insecurity (10/30/2023)  Housing: Low Risk  (10/30/2023)  Transportation Needs: No Transportation Needs (10/30/2023)  Utilities: Not At Risk (10/30/2023)  Alcohol Screen: Low Risk  (05/18/2023)  Financial Resource Strain: Low Risk  (05/18/2023)  Physical Activity: Inactive (08/20/2017)  Social Connections: Socially Integrated (10/30/2023)  Stress: No  Stress Concern Present (08/20/2017)  Tobacco Use: Medium Risk (11/07/2023)    Readmission Risk Interventions    11/09/2023   11:30 AM 11/06/2023    8:44 AM  Readmission Risk Prevention Plan  Transportation Screening Complete Complete  Medication Review (RN Care Manager) Complete Complete  PCP or Specialist appointment within 3-5 days of discharge Complete   HRI or Home Care Consult Complete Complete  SW Recovery Care/Counseling Consult Complete Complete  Palliative Care Screening Not Applicable Not Applicable  Skilled Nursing Facility Complete Not Applicable    Heather Saltness, MSW, LCSW 11/10/2023 10:24 AM

## 2023-11-10 NOTE — Progress Notes (Addendum)
 [facility]  PROGRESS NOTE  David [name] FMW:[id number] DOB: 02-07-1946 DOA: 10/29/2023 PCP: Rexanne Ingle, MD   Brief HPI:   78 y.o. male with medical history significant of  CAD s/p stent, Combined systolic diastolic heart failure, CKDIII, DmII, GERD, HTN  who presents to ED BIBEMS  with complaint of sob x 2 days.  ON arrival EMS placed patient on NRB and transported to ED. patient was admitted to the hospital in April for symptomatic anemia transferred 2 units of blood.  It was deemed to be related to anemia of chronic disease.  Patient was admitted for the treatment of CHF and pneumonia. Chest x-ray showed cardiomegaly vascular congestion and small right pleural effusion with CT of the chest shows negative for PE, shows pulmonary edema, pneumonia in the right lower lobe.  Pulmonary hypertension.    Assessment/Plan:   Acute on Chronic combined systolic/diastolic  CHF exacerbation  with associate acute hypoxic respiratory failure   Last echo from December 2024 with a EF of 45 to 40 to 45% noted. CT chest on admission showed no evidence of acute pulmonary embolism, CHF with pulmonary edema and moderate right and small left pleural effusion, superimposed pneumonia in the right lower lobe not excluded, dilated main pulmonary artery. -Started back on torsemide  20 mg daily    CAP presumed bacterial  Completed 5 days of  Rocephin  azithromycin .   RSV COVID and flu negative. Currently requiring 2 L point of oxygen, at baseline Encourage incentive spirometry. Continue neb treatments.    Abnormal Cardiac Enzyme mildly elevated troponin at 32 -presumed due to demand Follow-up echo this admission 35 to 40% EF  Epigastric discomfort/anxiety -Improved after starting Reglan  - Will start Bentyl  10 mg p.o. 3 times daily as needed with meals -Discontinue Amitiza  - Started on Zoloft  50 mg daily - Can be discharged on Reglan  5 mg p.o. every 8 hours as needed, Zoloft  50 mg daily, Bentyl   10 mg 3 times daily as needed   Macrocytic anemia -Hemoglobin dropped to 6.0 on 11/04/2023 -Status post 1 unit PRBC -Hemoglobin is 8.1 today -GI consulted, underwent EGD, which showed nonbleeding gastric ulcer, mucosal changes in duodenum; biopsies obtained - Plan to follow-up with GI as outpatient.  Patient to call GI for biopsy result in 5 days - Patient has been refusing colonoscopy at this time.    Hypertension improved  -continue metoprolol , Bidil     AKI On ckd stage IV-creatinine bumped to 3.58 from 2.06 from 1.97   -Creatinine has improved to 2.14 Started on torsemide  Follow BMP in am    CAD s/p stent  -continue beta-blocker, aspirin  and statin. -no active issues    DMII  hemoglobin A1c 6.0  on Lantus  at home      Chronic diabetic foot ulcer b/l  - Per wound care Unstageable Pressure Injury right and left heel; left and right lateral feet'; all are 100% eschar  Arterial ulceration left medial great toe; 100% clean Pressure Injury POA: Yes Cleanse all foot wounds with saline pat dry Paint all foot wounds with betadine, allow to air dry. Ok to cover with foam. Reapply betadine daily.  Offload heels at all times with prevalon boots Single layer of xeroform to the great toe wound, top with dry dressing or bandaid. Change daily Patient was supposed to follow-up with vascular surgery after discharge however he did not follow-up with them. - Treated with doxycycline  for 10 days  ABI normal on the right and moderate left lower extremity arterial disease.  he was due to have an appointment with vascular as an outpatient but he did not follow-up on the appointment. -Need follow-up with vascular surgery as outpatient    Disposition -Plan to go to skilled nursing facility       Pressure Injury 05/12/23 Coccyx Mid Stage 2 -  Partial thickness loss of dermis presenting as a shallow open injury with a red, pink wound bed without slough. (Active)  05/12/23 0400  Location:  Coccyx  Location Orientation: Mid  Staging: Stage 2 -  Partial thickness loss of dermis presenting as a shallow open injury with a red, pink wound bed without slough.  Wound Description (Comments):   DO NOT USE:  Present on Admission: Yes  Dressing Type Foam - Lift dressing to assess site every shift 11/02/23 0800     Pressure Injury 05/12/23 Heel Left Deep Tissue Pressure Injury - Purple or maroon localized area of discolored intact skin or blood-filled blister due to damage of underlying soft tissue from pressure and/or shear. (Active)  05/12/23 0400  Location: Heel  Location Orientation: Left  Staging: Deep Tissue Pressure Injury - Purple or maroon localized area of discolored intact skin or blood-filled blister due to damage of underlying soft tissue from pressure and/or shear.  Wound Description (Comments):   DO NOT USE:  Present on Admission: Yes  Dressing Type None 11/02/23 0800      Estimated body mass index is 24.64 kg/m as calculated from the following:   Height as of this encounter: 6' 2.02 (1.88 m).   Weight as of this encounter: 87.1 kg.      Medications     bisacodyl   10 mg Rectal Once   docusate sodium   100 mg Oral Daily   doxycycline   100 mg Oral Q12H   feeding supplement  237 mL Oral BID BM   insulin  aspart  0-6 Units Subcutaneous TID WC   isosorbide -hydrALAZINE   2 tablet Oral TID   latanoprost   1 drop Both Eyes QHS   metoCLOPramide  (REGLAN ) injection  5 mg Intravenous Q6H   pantoprazole   40 mg Oral Daily   senna-docusate  2 tablet Oral QHS   sertraline   25 mg Oral Daily   torsemide   20 mg Oral Daily   traZODone   100 mg Oral QHS     Data Reviewed:   CBG:  Recent Labs  Lab 11/09/23 1139 11/09/23 1642 11/09/23 2229 11/10/23 0737 11/10/23 1117  GLUCAP 220* 206* 218* 130* 151*    SpO2: 100 % O2 Flow Rate (L/min): 2 L/min    Vitals:   11/09/23 2014 11/10/23 0330 11/10/23 0500 11/10/23 1319  BP: (!) 141/73 123/73  120/61  Pulse: 68 (!) 51   (!) 56  Resp: 18 18  16   Temp: 98 F (36.7 C) 98 F (36.7 C)  98 F (36.7 C)  TempSrc: Oral Oral  Oral  SpO2: 100% 100%  100%  Weight:   92.5 kg   Height:          Data Reviewed:  Basic Metabolic Panel: Recent Labs  Lab 11/06/23 0951 11/07/23 0419 11/08/23 0357 11/09/23 0344 11/10/23 0903  NA 138 137 137 136 138  K 4.1 3.9 3.9 3.8 4.1  CL 103 103 103 103 101  CO2 25 24 25 26 27   GLUCOSE 130* 142* 142* 175* 141*  BUN 44* 43* 40* 40* 41*  CREATININE 2.91* 2.67* 2.39* 2.44* 2.14*  CALCIUM  8.7* 8.7* 8.4* 8.2* 8.5*    CBC: Recent Labs  Lab  11/06/23 0407 11/07/23 0419 11/08/23 0357 11/09/23 0344 11/10/23 0903  WBC 7.4 9.5 7.5 7.7 7.2  HGB 7.1* 7.6* 6.8* 7.4* 8.1*  HCT 22.9* 24.8* 22.2* 23.5* 25.9*  MCV 107.0* 108.8* 108.8* 103.5* 104.0*  PLT 204 225 214 207 207    LFT Recent Labs  Lab 11/04/23 0419 11/05/23 0843  AST 23 23  ALT 12 12  ALKPHOS 80 89  BILITOT 0.8 0.9  PROT 6.6 7.6  ALBUMIN  2.3* 2.6*     Antibiotics: Anti-infectives (From admission, onward)    Start     Dose/Rate Route Frequency Ordered Stop   11/01/23 2200  doxycycline  (VIBRA -TABS) tablet 100 mg        100 mg Oral Every 12 hours 11/01/23 0838     10/30/23 2200  cefTRIAXone  (ROCEPHIN ) 2 g in sodium chloride  0.9 % 100 mL IVPB        2 g 200 mL/hr over 30 Minutes Intravenous Every 24 hours 10/30/23 0007 11/03/23 2258   10/30/23 1000  doxycycline  (VIBRAMYCIN ) 100 mg in sodium chloride  0.9 % 250 mL IVPB  Status:  Discontinued        100 mg 125 mL/hr over 120 Minutes Intravenous Every 12 hours 10/30/23 0007 11/01/23 0838   10/29/23 2130  cefTRIAXone  (ROCEPHIN ) 1 g in sodium chloride  0.9 % 100 mL IVPB        1 g 200 mL/hr over 30 Minutes Intravenous  Once 10/29/23 2124 10/29/23 2214   10/29/23 2130  azithromycin  (ZITHROMAX ) 500 mg in sodium chloride  0.9 % 250 mL IVPB        500 mg 250 mL/hr over 60 Minutes Intravenous  Once 10/29/23 2124 10/30/23 0012        DVT prophylaxis:  Heparin   Code Status: Full code  Family Communication: No family at bedside   CONSULTS    Subjective   Nausea and epigastric pain improved after starting Reglan .  Objective    Physical Examination:  General-appears in no acute distress Heart-S1-S2, regular, no murmur auscultated Lungs-clear to auscultation bilaterally, no wheezing or crackles auscultated Abdomen-soft, nontender, no organomegaly Extremities-no edema in the lower extremities Neuro-alert, oriented x3, no focal deficit noted  Status is: Inpatient:      Pressure Injury 05/12/23 Coccyx Mid Stage 2 -  Partial thickness loss of dermis presenting as a shallow open injury with a red, pink wound bed without slough. (Active)  05/12/23 0400  Location: Coccyx  Location Orientation: Mid  Staging: Stage 2 -  Partial thickness loss of dermis presenting as a shallow open injury with a red, pink wound bed without slough.  Wound Description (Comments):   DO NOT USE:  Present on Admission: Yes     Pressure Injury 05/12/23 Heel Left Deep Tissue Pressure Injury - Purple or maroon localized area of discolored intact skin or blood-filled blister due to damage of underlying soft tissue from pressure and/or shear. (Active)  05/12/23 0400  Location: Heel  Location Orientation: Left  Staging: Deep Tissue Pressure Injury - Purple or maroon localized area of discolored intact skin or blood-filled blister due to damage of underlying soft tissue from pressure and/or shear.  Wound Description (Comments):   DO NOT USE:  Present on Admission: Yes        Keimon Basaldua S Nyiah Pianka   [facility] If 7PM-7AM, please contact night-coverage at [email or web address], Office  [phone]   11/10/2023, 3:16 PM  LOS: 11 days

## 2023-11-11 DIAGNOSIS — E1169 Type 2 diabetes mellitus with other specified complication: Secondary | ICD-10-CM

## 2023-11-11 DIAGNOSIS — E785 Hyperlipidemia, unspecified: Secondary | ICD-10-CM

## 2023-11-11 DIAGNOSIS — I509 Heart failure, unspecified: Secondary | ICD-10-CM | POA: Diagnosis not present

## 2023-11-11 LAB — BASIC METABOLIC PANEL WITH GFR
Anion gap: 9 (ref 5–15)
BUN: 40 mg/dL — ABNORMAL HIGH (ref 8–23)
CO2: 27 mmol/L (ref 22–32)
Calcium: 8.4 mg/dL — ABNORMAL LOW (ref 8.9–10.3)
Chloride: 97 mmol/L — ABNORMAL LOW (ref 98–111)
Creatinine, Ser: 2.5 mg/dL — ABNORMAL HIGH (ref 0.61–1.24)
GFR, Estimated: 26 mL/min — ABNORMAL LOW (ref >60)
Glucose, Bld: 119 mg/dL — ABNORMAL HIGH (ref 70–99)
Potassium: 4.1 mmol/L (ref 3.5–5.1)
Sodium: 133 mmol/L — ABNORMAL LOW (ref 135–145)

## 2023-11-11 LAB — GLUCOSE, CAPILLARY
Glucose-Capillary: 125 mg/dL — ABNORMAL HIGH (ref 70–99)
Glucose-Capillary: 160 mg/dL — ABNORMAL HIGH (ref 70–99)
Glucose-Capillary: 197 mg/dL — ABNORMAL HIGH (ref 70–99)
Glucose-Capillary: 141 mg/dL — ABNORMAL HIGH (ref 70–99)

## 2023-11-11 LAB — SURGICAL PATHOLOGY

## 2023-11-11 MED ORDER — TRAMADOL HCL 50 MG PO TABS: 50.0000 mg | ORAL_TABLET | Freq: Three times a day (TID) | ORAL | 0 refills | Status: DC | PRN

## 2023-11-11 MED ORDER — METOCLOPRAMIDE HCL 5 MG PO TABS: 5.0000 mg | ORAL_TABLET | Freq: Three times a day (TID) | ORAL | Status: DC

## 2023-11-11 MED ORDER — INSULIN ASPART 100 UNIT/ML IJ SOLN: 0.0000 [IU] | Freq: Three times a day (TID) | INTRAMUSCULAR | Status: DC

## 2023-11-11 NOTE — Plan of Care (Signed)
  Problem: Education: Goal: Ability to describe self-care measures that may prevent or decrease complications (Diabetes Survival Skills Education) will improve Outcome: Progressing   Problem: Coping: Goal: Ability to adjust to condition or change in health will improve Outcome: Progressing   Problem: Health Behavior/Discharge Planning: Goal: Ability to identify and utilize available resources and services will improve Outcome: Progressing

## 2023-11-11 NOTE — Discharge Summary (Addendum)
 Physician Discharge Summary  David [name] FMW:[id number] DOB: 1945-09-06 DOA: 10/29/2023  PCP: Rexanne Ingle, MD  Admit date: 10/29/2023 Discharge date: 11/11/2023   Discharge disposition: Skilled nursing facility   Recommendations for Outpatient Follow-Up:   Cbc/bmp 1 week Vascular follow up   Discharge Diagnosis:   Principal Problem:   CHF (congestive heart failure) (HCC) Active Problems:   Pneumonia due to infectious organism    Discharge Condition: Improved.  Diet recommendation: Regular.  Wound care: Cleanse all foot wounds with saline pat dry Paint all foot wounds with betadine, allow to air dry. Ok to cover with foam. Reapply betadine daily.  Offload heels at all times with prevalon boots Single layer of xeroform to the great toe wound, top with dry dressing or bandaid. Change daily  Code status: Full.   History of Present Illness:   78 y.o. male with medical history significant of  CAD s/p stent, Combined systolic diastolic heart failure, CKDIII, DmII, GERD, HTN  who presents to ED BIBEMS  with complaint of sob x 2 days.  ON arrival EMS placed patient on NRB and transported to ED. patient was admitted to the hospital in April for symptomatic anemia transferred 2 units of blood.  It was deemed to be related to anemia of chronic disease.  Patient was admitted for the treatment of CHF and pneumonia. Chest x-ray showed cardiomegaly vascular congestion and small right pleural effusion with CT of the chest shows negative for PE, shows pulmonary edema, pneumonia in the right lower lobe.  Pulmonary hypertension.   Hospital Course by Problem:   Acute on Chronic combined systolic/diastolic  CHF exacerbation  with associate acute hypoxic respiratory failure   Last echo from December 2024 with a EF of 45 to 40 to 45% noted. CT chest on admission showed no evidence of acute pulmonary embolism, CHF with pulmonary edema and moderate right and small left pleural  effusion, superimposed pneumonia in the right lower lobe not excluded, dilated main pulmonary artery. -Started back on torsemide  20 mg daily     CAP presumed bacterial  Completed 5 days of  Rocephin  azithromycin .   RSV COVID and flu negative. Currently requiring 2 L point of oxygen, at baseline Encourage incentive spirometry. Continue neb treatments   Abnormal Cardiac Enzyme mildly elevated troponin at 32 -presumed due to demand -echo this admission 35 to 40% EF   Epigastric discomfort/anxiety -Improved after starting Reglan  -  discharged on Reglan  5 mg p.o. every 8 hours as needed, Zoloft  50 mg daily, Bentyl  10 mg 3 times daily as needed   Macrocytic anemia -Hemoglobin dropped to 6.0 on 11/04/2023 -Status post 1 unit PRBC -GI consulted, underwent EGD, which showed nonbleeding gastric ulcer, mucosal changes in duodenum; biopsies obtained - Plan to follow-up with GI as outpatient.  Patient to call GI for biopsy result in 5 days - Patient has been refusing colonoscopy at this time.     Hypertension improved  -continue metoprolol , Bidil     AKI On ckd stage IV-creatinine bumped to 3.58 from 2.06 from 1.97   -Creatinine has improved to 2.14 Started on torsemide  - Trend    CAD s/p stent  -continue beta-blocker, aspirin  and statin. -no active issues    DMII  hemoglobin A1c 6.0  on Lantus  at home   Chronic diabetic foot ulcer b/l  - Per wound care Unstageable Pressure Injury right and left heel; left and right lateral feet'; all are 100% eschar  Arterial ulceration left medial great  toe; 100% clean Pressure Injury POA: Yes Cleanse all foot wounds with saline pat dry Paint all foot wounds with betadine, allow to air dry. Ok to cover with foam. Reapply betadine daily.  Offload heels at all times with prevalon boots Single layer of xeroform to the great toe wound, top with dry dressing or bandaid. Change daily - Treated with doxycycline  for 10 days  ABI normal on the right and  moderate left lower extremity arterial disease.    -he was due to have an appointment with vascular as an outpatient but he did not follow-up on the appointment. -Need follow-up with vascular surgery as outpatient      Medical Consultants:   GI   Discharge Exam:   Vitals:   11/11/23 0513 11/11/23 1121  BP: 134/73 134/80  Pulse: (!) 54 66  Resp: 17 19  Temp: 97.7 F (36.5 C) 97.7 F (36.5 C)  SpO2: 98% 100%   Vitals:   11/10/23 1945 11/11/23 0500 11/11/23 0513 11/11/23 1121  BP: 132/66  134/73 134/80  Pulse: (!) 56  (!) 54 66  Resp: 16  17 19   Temp: 98.1 F (36.7 C)  97.7 F (36.5 C) 97.7 F (36.5 C)  TempSrc: Oral  Oral Oral  SpO2: 100%  98% 100%  Weight:  89.7 kg    Height:        General exam: Appears calm and comfortable. On 2L [place]   The results of significant diagnostics from this hospitalization (including imaging, microbiology, ancillary and laboratory) are listed below for reference.     Procedures and Diagnostic Studies:   ECHOCARDIOGRAM COMPLETE Result Date: 10/30/2023    ECHOCARDIOGRAM REPORT   Patient Name:   David [name] Date of Exam: 10/30/2023 Medical Rec #:  [id number]        Height:       74.0 in Accession #:    [id number]       Weight:       191.8 lb Date of Birth:  1945/08/23       BSA:          2.135 m Patient Age:    77 years         BP:           177/87 mmHg Patient Gender: M                HR:           60 bpm. Exam Location:  Inpatient Procedure: 2D Echo, Cardiac Doppler, Color Doppler and Strain Analysis (Both            Spectral and Color Flow Doppler were utilized during procedure). Indications:    chf  History:        Patient has prior history of Echocardiogram examinations, most                 recent 06/30/2022.  Sonographer:    Therisa Crouch Referring Phys: [id number] SARA-MAIZ A THOMAS IMPRESSIONS  1. Left ventricular ejection fraction, by estimation, is 35 to 40%. The left ventricle has moderately decreased function. The left ventricle  demonstrates global hypokinesis. There is mild left ventricular hypertrophy. Left ventricular diastolic parameters are consistent with Grade II diastolic dysfunction (pseudonormalization). Elevated left atrial pressure. The average left ventricular global longitudinal strain is -12.2 %. The global longitudinal strain is abnormal.  2. Right ventricular systolic function is mildly reduced. The right ventricular size is mildly enlarged. There is severely elevated pulmonary artery systolic pressure.  The estimated right ventricular systolic pressure is 89.3 mmHg.  3. Left atrial size was mildly dilated.  4. The mitral valve is normal in structure. Mild mitral valve regurgitation. No evidence of mitral stenosis.  5. The aortic valve is tricuspid. Aortic valve regurgitation is not visualized. No aortic stenosis is present.  6. The inferior vena cava is dilated in size with <50% respiratory variability, suggesting right atrial pressure of 15 mmHg. FINDINGS  Left Ventricle: Left ventricular ejection fraction, by estimation, is 35 to 40%. The left ventricle has moderately decreased function. The left ventricle demonstrates global hypokinesis. The average left ventricular global longitudinal strain is -12.2 %. Strain was performed and the global longitudinal strain is abnormal. The left ventricular internal cavity size was normal in size. There is mild left ventricular hypertrophy. Left ventricular diastolic parameters are consistent with Grade II diastolic  dysfunction (pseudonormalization). Elevated left atrial pressure. Right Ventricle: The right ventricular size is mildly enlarged. No increase in right ventricular wall thickness. Right ventricular systolic function is mildly reduced. There is severely elevated pulmonary artery systolic pressure. The tricuspid regurgitant velocity is 4.31 m/s, and with an assumed right atrial pressure of 15 mmHg, the estimated right ventricular systolic pressure is 89.3 mmHg. Left Atrium:  Left atrial size was mildly dilated. Right Atrium: Right atrial size was normal in size. Pericardium: There is no evidence of pericardial effusion. Mitral Valve: The mitral valve is normal in structure. Mild mitral valve regurgitation. No evidence of mitral valve stenosis. Tricuspid Valve: The tricuspid valve is normal in structure. Tricuspid valve regurgitation is mild. Aortic Valve: The aortic valve is tricuspid. Aortic valve regurgitation is not visualized. No aortic stenosis is present. Aortic valve mean gradient measures 3.0 mmHg. Aortic valve peak gradient measures 6.2 mmHg. Aortic valve area, by VTI measures 2.10 cm. Pulmonic Valve: The pulmonic valve was not well visualized. Pulmonic valve regurgitation is trivial. Aorta: The aortic root is normal in size and structure. Venous: The inferior vena cava is dilated in size with less than 50% respiratory variability, suggesting right atrial pressure of 15 mmHg. IAS/Shunts: The interatrial septum was not well visualized.  LEFT VENTRICLE PLAX 2D LVIDd:         4.80 cm      Diastology LVIDs:         3.70 cm      LV e' medial:    4.79 cm/s LV PW:         1.10 cm      LV E/e' medial:  8.6 LV IVS:        1.20 cm      LV e' lateral:   4.82 cm/s LVOT diam:     2.20 cm      LV E/e' lateral: 8.5 LV SV:         59 LV SV Index:   28           2D Longitudinal Strain LVOT Area:     3.80 cm     2D Strain GLS Avg:     -12.2 %  LV Volumes (MOD) LV vol d, MOD A2C: 172.0 ml LV vol d, MOD A4C: 144.0 ml LV vol s, MOD A2C: 97.4 ml LV vol s, MOD A4C: 85.7 ml LV SV MOD A2C:     74.6 ml LV SV MOD A4C:     144.0 ml LV SV MOD BP:      71.6 ml RIGHT VENTRICLE  IVC RV Basal diam:  4.60 cm    IVC diam: 2.30 cm RV S prime:     5.96 cm/s TAPSE (M-mode): 1.6 cm LEFT ATRIUM             Index        RIGHT ATRIUM           Index LA diam:        3.90 cm 1.83 cm/m   RA Area:     20.60 cm LA Vol (A2C):   81.6 ml 38.22 ml/m  RA Volume:   59.50 ml  27.87 ml/m LA Vol (A4C):   89.1 ml  41.73 ml/m LA Biplane Vol: 87.1 ml 40.79 ml/m  AORTIC VALVE AV Area (Vmax):    2.24 cm AV Area (Vmean):   2.32 cm AV Area (VTI):     2.10 cm AV Vmax:           125.00 cm/s AV Vmean:          84.700 cm/s AV VTI:            0.282 m AV Peak Grad:      6.2 mmHg AV Mean Grad:      3.0 mmHg LVOT Vmax:         73.80 cm/s LVOT Vmean:        51.700 cm/s LVOT VTI:          0.156 m LVOT/AV VTI ratio: 0.55  AORTA Ao Root diam: 3.00 cm MITRAL VALVE               TRICUSPID VALVE MV Area (PHT): 4.49 cm    TR Peak grad:   74.3 mmHg MV E velocity: 41.10 cm/s  TR Vmax:        431.00 cm/s MV A velocity: 58.30 cm/s MV E/A ratio:  0.70        SHUNTS                            Systemic VTI:  0.16 m                            Systemic Diam: 2.20 cm Lonni Nanas MD Electronically signed by Lonni Nanas MD Signature Date/Time: 10/30/2023/4:35:21 PM    Final    CT Angio Chest PE W and/or Wo Contrast Result Date: 10/29/2023 CLINICAL DATA:  Shortness of breath for 2 days getting worse. History of CHF. EXAM: CT ANGIOGRAPHY CHEST WITH CONTRAST TECHNIQUE: Multidetector CT imaging of the chest was performed using the standard protocol during bolus administration of intravenous contrast. Multiplanar CT image reconstructions and MIPs were obtained to evaluate the vascular anatomy. RADIATION DOSE REDUCTION: This exam was performed according to the departmental dose-optimization program which includes automated exposure control, adjustment of the mA and/or kV according to patient size and/or use of iterative reconstruction technique. CONTRAST:  60mL OMNIPAQUE  IOHEXOL  350 MG/ML SOLN COMPARISON:  Same day chest radiograph and CT chest 05/05/2023 FINDINGS: Cardiovascular: Negative for acute pulmonary embolism. Dilated main pulmonary artery measuring 34 mm in diameter. Dilated ascending aorta measuring 40 mm in diameter. Cardiomegaly. Trace pericardial effusion. Coronary artery and aortic atherosclerotic calcification.  Mediastinum/Nodes: Shotty mediastinal and hilar lymph nodes are favored reactive. Trachea and esophagus are unremarkable. Lungs/Pleura: Marked interlobular septal thickening throughout both lungs. Peribronchovascular ground-glass and consolidative opacities greatest in the right lower lobe. Moderate right small left pleural effusions. No pneumothorax.  Upper Abdomen: Question gastric wall thickening versus underdistention. Otherwise no acute abnormality. Musculoskeletal: No acute fracture. Review of the MIP images confirms the above findings. IMPRESSION: 1. Negative for acute pulmonary embolism. 2. CHF with pulmonary edema. Moderate right and small left pleural effusions. Superimposed pneumonia in the right lower lobe is not excluded. 3. Dilated main pulmonary artery, which can be seen in the setting of pulmonary hypertension. 4. Question gastritis versus underdistention. 5. Aortic Atherosclerosis (ICD10-I70.0). Electronically Signed   By: Norman Gatlin M.D.   On: 10/29/2023 21:20   DG Chest Portable 1 View Result Date: 10/29/2023 CLINICAL DATA:  Shortness of breath EXAM: PORTABLE CHEST 1 VIEW COMPARISON:  05/19/2023 FINDINGS: Heart is mildly enlarged. Mild vascular congestion. Small right pleural effusion again noted with peripheral airspace opacities throughout the right lung, similar prior study. Diffuse interstitial opacities concerning for edema. IMPRESSION: Cardiomegaly with vascular congestion and probable interstitial edema. Small right pleural effusion with chronic peripheral densities in the right mid and lower lung, favor scarring. Electronically Signed   By: Franky Crease M.D.   On: 10/29/2023 18:35     Labs:   Basic Metabolic Panel: Recent Labs  Lab 11/07/23 0419 11/08/23 0357 11/09/23 0344 11/10/23 0903 11/11/23 0426  NA 137 137 136 138 133*  K 3.9 3.9 3.8 4.1 4.1  CL 103 103 103 101 97*  CO2 24 25 26 27 27   GLUCOSE 142* 142* 175* 141* 119*  BUN 43* 40* 40* 41* 40*  CREATININE  2.67* 2.39* 2.44* 2.14* 2.50*  CALCIUM  8.7* 8.4* 8.2* 8.5* 8.4*   GFR Estimated Creatinine Clearance: 29.2 mL/min (A) (by C-G formula based on SCr of 2.5 mg/dL (H)). Liver Function Tests: Recent Labs  Lab 11/05/23 0843  AST 23  ALT 12  ALKPHOS 89  BILITOT 0.9  PROT 7.6  ALBUMIN  2.6*   No results for input(s): LIPASE, AMYLASE in the last 168 hours. No results for input(s): AMMONIA in the last 168 hours. Coagulation profile No results for input(s): INR, PROTIME in the last 168 hours.  CBC: Recent Labs  Lab 11/06/23 0407 11/07/23 0419 11/08/23 0357 11/09/23 0344 11/10/23 0903  WBC 7.4 9.5 7.5 7.7 7.2  HGB 7.1* 7.6* 6.8* 7.4* 8.1*  HCT 22.9* 24.8* 22.2* 23.5* 25.9*  MCV 107.0* 108.8* 108.8* 103.5* 104.0*  PLT 204 225 214 207 207   Cardiac Enzymes: No results for input(s): CKTOTAL, CKMB, CKMBINDEX, TROPONINI in the last 168 hours. BNP: Invalid input(s): POCBNP CBG: Recent Labs  Lab 11/10/23 1117 11/10/23 1630 11/10/23 2114 11/11/23 0741 11/11/23 1117  GLUCAP 151* 284* 202* 125* 160*   D-Dimer No results for input(s): DDIMER in the last 72 hours. Hgb A1c No results for input(s): HGBA1C in the last 72 hours. Lipid Profile No results for input(s): CHOL, HDL, LDLCALC, TRIG, CHOLHDL, LDLDIRECT in the last 72 hours. Thyroid  function studies No results for input(s): TSH, T4TOTAL, T3FREE, THYROIDAB in the last 72 hours.  Invalid input(s): FREET3 Anemia work up No results for input(s): VITAMINB12, FOLATE, FERRITIN, TIBC, IRON , RETICCTPCT in the last 72 hours. Microbiology No results found for this or any previous visit (from the past 240 hours).   Discharge Instructions:   Discharge Instructions     (HEART FAILURE PATIENTS) Call MD:  Anytime you have any of the following symptoms: 1) 3 pound weight gain in 24 hours or 5 pounds in 1 week 2) shortness of breath, with or without a dry hacking cough 3)  swelling in the hands, feet or stomach 4)  if you have to sleep on extra pillows at night in order to breathe.   Complete by: As directed    Diet - low sodium heart healthy   Complete by: As directed    Diet Carb Modified   Complete by: As directed    Heart Failure patients record your daily weight using the same scale at the same time of day   Complete by: As directed    Increase activity slowly   Complete by: As directed       Allergies as of 11/11/2023       Reactions   Nsaids Other (See Comments)   CKD stage 3         Medication List     PAUSE taking these medications    amLODipine  10 MG tablet Wait to take this until your doctor or other care provider tells you to start again. Commonly known as: NORVASC  Take 10 mg by mouth daily.   metoprolol  succinate 100 MG 24 hr tablet Wait to take this until your doctor or other care provider tells you to start again. Commonly known as: TOPROL -XL Take 100 mg by mouth daily.       STOP taking these medications    insulin  glargine-yfgn 100 UNIT/ML injection Commonly known as: SEMGLEE        TAKE these medications    acetaminophen  500 MG tablet Commonly known as: TYLENOL  Take 1,000 mg by mouth in the morning and at bedtime.   albuterol  108 (90 Base) MCG/ACT inhaler Commonly known as: VENTOLIN  HFA Inhale 2 puffs into the lungs every 6 (six) hours as needed for wheezing or shortness of breath.   aspirin  EC 81 MG tablet Take 81 mg by mouth daily.   atorvastatin  80 MG tablet Commonly known as: LIPITOR  Take 80 mg by mouth at bedtime.   bisacodyl  10 MG suppository Commonly known as: DULCOLAX Place 10 mg rectally daily as needed for moderate constipation.   bismuth subsalicylate 262 MG/15ML suspension Commonly known as: PEPTO BISMOL Take 30 mLs by mouth daily as needed for diarrhea or loose stools.   cholecalciferol  25 MCG (1000 UNIT) tablet Commonly known as: VITAMIN D3 Take 5,000 Units by mouth daily.    cyanocobalamin 1000 MCG tablet Commonly known as: VITAMIN B12 Take 2,000 mcg by mouth daily.   dextromethorphan-guaiFENesin  30-600 MG 12hr tablet Commonly known as: MUCINEX  DM Take 1 tablet by mouth 2 (two) times daily.   dicyclomine  10 MG capsule Commonly known as: BENTYL  Take 10 mg by mouth in the morning, at noon, in the evening, and at bedtime.   docusate sodium  100 MG capsule Commonly known as: COLACE Take 100 mg by mouth 2 (two) times daily.   ferrous sulfate  325 (65 FE) MG tablet Take 325 mg by mouth daily with breakfast.   gabapentin  100 MG capsule Commonly known as: NEURONTIN  Take 200 mg by mouth 3 (three) times daily.   insulin  aspart 100 UNIT/ML injection Commonly known as: novoLOG  Inject 0-6 Units into the skin 3 (three) times daily with meals.   isosorbide -hydrALAZINE  20-37.5 MG tablet Commonly known as: BIDIL  Take 2 tablets by mouth 3 (three) times daily.   Lantus  SoloStar 100 UNIT/ML Solostar Pen Generic drug: insulin  glargine Inject 12 Units into the skin daily.   latanoprost  0.005 % ophthalmic solution Commonly known as: XALATAN  Place 1 drop into both eyes at bedtime.   leptospermum manuka honey Pste paste Apply 1 Application topically daily. Apply to buttocks and left heel as directed  lubiprostone  24 MCG capsule Commonly known as: AMITIZA  Take 24 mcg by mouth daily.   melatonin 3 MG Tabs tablet Take 3 mg by mouth at bedtime.   methocarbamol  500 MG tablet Commonly known as: ROBAXIN  Take 1 tablet (500 mg total) by mouth every 6 (six) hours as needed for muscle spasms.   metoCLOPramide  5 MG tablet Commonly known as: Reglan  Take 1 tablet (5 mg total) by mouth 3 (three) times daily.   multivitamin with minerals Tabs tablet Take 1 tablet by mouth daily.   nitroGLYCERIN  0.4 MG SL tablet Commonly known as: NITROSTAT  Place 1 tablet (0.4 mg total) under the tongue every 5 (five) minutes as needed for chest pain.   ondansetron  4 MG  tablet Commonly known as: ZOFRAN  Take 4 mg by mouth every 6 (six) hours as needed for nausea or vomiting.   OXYGEN Inhale 3 L/min into the lungs continuous.   pantoprazole  40 MG tablet Commonly known as: Protonix  Take 1 tablet (40 mg total) by mouth daily.   polyethylene glycol powder 17 GM/SCOOP powder Commonly known as: GLYCOLAX /MIRALAX  Take 1 capful (17 g) by mouth daily. What changed: additional instructions   pyridoxine  100 MG tablet Commonly known as: B-6 Take 100 mg by mouth daily.   sertraline  25 MG tablet Commonly known as: ZOLOFT  Take 25 mg by mouth daily.   torsemide  20 MG tablet Commonly known as: DEMADEX  Take 1 tablet (20 mg total) by mouth daily. What changed: when to take this   traMADol  50 MG tablet Commonly known as: ULTRAM  Take 1 tablet (50 mg total) by mouth 3 (three) times daily as needed for moderate pain (pain score 4-6) or severe pain (pain score 7-10).   traZODone  100 MG tablet Commonly known as: DESYREL  Take 100 mg by mouth at bedtime.   UNABLE TO FIND Take 30 mLs by mouth in the morning and at bedtime. Med Name: protein liquid          Time coordinating discharge: 45 min  Signed:  Harlene RAYMOND Bowl DO  [facility] 11/11/2023, 12:27 PM

## 2023-11-11 NOTE — TOC Progression Note (Signed)
 Transition of Care [facility]) - Progression Note    Patient Details  Name: David [name] MRN: [id number] Date of Birth: February 11, 1946  [facility] CM/SW Contact  Heather DELENA Saltness, LCSW Phone Number: 11/11/2023, 1:34 PM  Clinical Narrative:    CSW met with pt and wife at bedside to discuss SNF bed choice. Pt chooses bed at [facility] for short-term rehab. Insurance authorization has been requested, currently pending.     Barriers to Discharge: Continued Medical Work up  Expected Discharge Plan and Services [place]       Expected Discharge Date: 11/11/23                     Social Determinants of Health (SDOH) Interventions SDOH Screenings   Food Insecurity: No Food Insecurity (10/30/2023)  Housing: Low Risk  (10/30/2023)  Transportation Needs: No Transportation Needs (10/30/2023)  Utilities: Not At Risk (10/30/2023)  Alcohol Screen: Low Risk  (05/18/2023)  Financial Resource Strain: Low Risk  (05/18/2023)  Physical Activity: Inactive (08/20/2017)  Social Connections: Socially Integrated (10/30/2023)  Stress: No Stress Concern Present (08/20/2017)  Tobacco Use: Medium Risk (11/07/2023)    Readmission Risk Interventions    11/09/2023   11:30 AM 11/06/2023    8:44 AM  Readmission Risk Prevention Plan  Transportation Screening Complete Complete  Medication Review (RN Care Manager) Complete Complete  PCP or Specialist appointment within 3-5 days of discharge Complete   HRI or Home Care Consult Complete Complete  SW Recovery Care/Counseling Consult Complete Complete  Palliative Care Screening Not Applicable Not Applicable  Skilled Nursing Facility Complete Not Applicable    Heather Saltness, MSW, LCSW 11/11/2023 1:36 PM

## 2023-11-11 NOTE — Care Management Important Message (Signed)
 Important Message  Patient Details IM Letter given. Name: David [name] MRN: [id number] Date of Birth: 12-30-1945   Important Message Given:  Yes - Medicare IM     [name] 11/11/2023, 10:02 AM

## 2023-11-12 LAB — GLUCOSE, CAPILLARY
Glucose-Capillary: 155 mg/dL — ABNORMAL HIGH (ref 70–99)
Glucose-Capillary: 298 mg/dL — ABNORMAL HIGH (ref 70–99)
Glucose-Capillary: 314 mg/dL — ABNORMAL HIGH (ref 70–99)
Glucose-Capillary: 355 mg/dL — ABNORMAL HIGH (ref 70–99)

## 2023-11-12 NOTE — Progress Notes (Signed)
 Physical Therapy Treatment Patient Details Name: David [name] MRN: [id number] DOB: 03/01/1946 Today's Date: 11/12/2023   History of Present Illness Pt is a 78 y.o. male admitted 10/29/23 for Acute on Chronic combined systolic/diastolic CHF exacerbation with associate acute hypoxic respiratory failure. Past medical history significant of CAD s/p stent, Combined systolic diastolic heart failure, CKD, GERD, HTN , anemia of chronic illness, DM II, neuropathy    PT Comments  Pt in bed, agreeable to PT session within therapeutic window for pain meds. Requires heavy cues and mod A to roll and come to sit EOB. Once sitting, he requires fading physical assist due to tendency to lean R. Pt reports that he noticed the leaning came about 1 year ago, same time frame as loss of vision which is likely associated with dec proprioception due to dec visual input. Educated on finding COM and mitigating lean. Pt able to maintain balance with intermittent HHA sitting EOB x5-10 mins. Pt will benefit from skilled PT 5 times per week at dc.     If plan is discharge home, recommend the following: Two people to help with walking and/or transfers;A lot of help with bathing/dressing/bathroom;Assistance with cooking/housework;Assist for transportation   Can travel by private vehicle     No  Equipment Recommendations  Hospital bed;Hoyer lift    Recommendations for Other Services       Precautions / Restrictions Precautions Precautions: Fall Precaution/Restrictions Comments: pt reports 2L O2 [place] baseline, wears left knee sleeve/brace, Prevalon boots for bil heel pressure injuries Restrictions Weight Bearing Restrictions Per Provider Order: No     Mobility  Bed Mobility Overal bed mobility: Needs Assistance Bed Mobility: Rolling, Sidelying to Sit, Sit to Supine Rolling: Mod assist, Used rails Sidelying to sit: Mod assist   Sit to supine: Min assist   General bed mobility comments: PRAFOs in place for  protection of B LE wounds and thick work gloves on hands for pain management, tends to lean right in sitting    Transfers                   General transfer comment: unable to progress at this time due to LE pain and safety in standing while donning PRAFOs    Ambulation/Gait                   Stairs             Wheelchair Mobility     Tilt Bed    Modified Rankin (Stroke Patients Only)       Balance Overall balance assessment: Needs assistance Sitting-balance support: Bilateral upper extremity supported, Feet supported Sitting balance-Leahy Scale: Poor Sitting balance - Comments: significant R lateral lean supported and unsupported Postural control: Right lateral lean                                  Communication Communication Communication: Impaired;No apparent difficulties Factors Affecting Communication: Other (comment) (blind R eye)  Cognition Arousal: Alert Behavior During Therapy: WFL for tasks assessed/performed   PT - Cognitive impairments: No apparent impairments                         Following commands: Intact      Cueing Cueing Techniques: Verbal cues  Exercises      General Comments General comments (skin integrity, edema, etc.): dressings clean and intact bilaterally, maintains donning of PRAFO  throughout session, reports right lean came about in timeframe of losing vision in R eye 1 year ago      Pertinent Vitals/Pain Pain Assessment Pain Assessment: 0-10 Pain Score: 7  Pain Location: low back (chronic), L knee (s/p 2 weeks closed knee in door) Pain Intervention(s): Limited activity within patient's tolerance, Monitored during session, Repositioned    Home Living                          Prior Function            PT Goals (current goals can now be found in the care plan section) Acute Rehab PT Goals PT Goal Formulation: With patient/family Time For Goal Achievement:  11/22/23 Potential to Achieve Goals: Fair Progress towards PT goals: Progressing toward goals    Frequency    Min 2X/week      PT Plan      Co-evaluation              AM-PAC PT 6 Clicks Mobility   Outcome Measure  Help needed turning from your back to your side while in a flat bed without using bedrails?: A Lot Help needed moving from lying on your back to sitting on the side of a flat bed without using bedrails?: A Lot Help needed moving to and from a bed to a chair (including a wheelchair)?: Total Help needed standing up from a chair using your arms (e.g., wheelchair or bedside chair)?: Total Help needed to walk in hospital room?: Total Help needed climbing 3-5 steps with a railing? : Total 6 Click Score: 8    End of Session Equipment Utilized During Treatment: Oxygen Activity Tolerance: Patient tolerated treatment well Patient left: in bed;with call bell/phone within reach;with family/visitor present;with bed alarm set Nurse Communication: Mobility status PT Visit Diagnosis: Muscle weakness (generalized) (M62.81);Other abnormalities of gait and mobility (R26.89)     Time: 8876-8857 PT Time Calculation (min) (ACUTE ONLY): 19 min  Charges:    $Therapeutic Activity: 8-22 mins PT General Charges $$ ACUTE PT VISIT: 1 Visit                     Stann, PT Acute Rehabilitation Services Office: [phone] 11/12/2023    Stann DELENA Ohara 11/12/2023, 12:20 PM

## 2023-11-12 NOTE — TOC Progression Note (Signed)
 Transition of Care [facility]) - Progression Note    Patient Details  Name: David [name] MRN: [id number] Date of Birth: 1946/02/24  [facility]) CM/SW Contact  Tawni CHRISTELLA Eva, LCSW Phone Number: 11/12/2023, 10:55 AM  Clinical Narrative:     Pt's insurance shara is pending. TOC to follow.     Barriers to Discharge: Continued Medical Work up  Expected Discharge Plan and Services         Expected Discharge Date: 11/11/23                                     Social Determinants of Health (SDOH) Interventions SDOH Screenings   Food Insecurity: No Food Insecurity (10/30/2023)  Housing: Low Risk  (10/30/2023)  Transportation Needs: No Transportation Needs (10/30/2023)  Utilities: Not At Risk (10/30/2023)  Alcohol Screen: Low Risk  (05/18/2023)  Financial Resource Strain: Low Risk  (05/18/2023)  Physical Activity: Inactive (08/20/2017)  Social Connections: Socially Integrated (10/30/2023)  Stress: No Stress Concern Present (08/20/2017)  Tobacco Use: Medium Risk (11/07/2023)    Readmission Risk Interventions    11/09/2023   11:30 AM 11/06/2023    8:44 AM  Readmission Risk Prevention Plan  Transportation Screening Complete Complete  Medication Review Oceanographer) Complete Complete  PCP or Specialist appointment within 3-5 days of discharge Complete   HRI or Home Care Consult Complete Complete  SW Recovery Care/Counseling Consult Complete Complete  Palliative Care Screening Not Applicable Not Applicable  Skilled Nursing Facility Complete Not Applicable

## 2023-11-12 NOTE — Plan of Care (Signed)
 Awaiting insurance approval for SNF, medically cleared    Problem: Coping: Goal: Ability to adjust to condition or change in health will improve Outcome: Progressing   Problem: Fluid Volume: Goal: Ability to maintain a balanced intake and output will improve Outcome: Progressing   Problem: Health Behavior/Discharge Planning: Goal: Ability to identify and utilize available resources and services will improve Outcome: Progressing

## 2023-11-12 NOTE — Progress Notes (Signed)
 Continue to await insurance authorization for patient who is medically ready to be discharged to a skilled nursing facility. JV

## 2023-11-13 LAB — GLUCOSE, CAPILLARY
Glucose-Capillary: 166 mg/dL — ABNORMAL HIGH (ref 70–99)
Glucose-Capillary: 149 mg/dL — ABNORMAL HIGH (ref 70–99)
Glucose-Capillary: 229 mg/dL — ABNORMAL HIGH (ref 70–99)
Glucose-Capillary: 250 mg/dL — ABNORMAL HIGH (ref 70–99)

## 2023-11-13 MED ORDER — TRAMADOL HCL 50 MG PO TABS
50.0000 mg | ORAL_TABLET | Freq: Three times a day (TID) | ORAL | Status: DC | PRN
  Administered 2023-11-13 – 2023-11-18 (×9): 100 mg via ORAL
  Administered 2023-11-18: 50 mg via ORAL
  Administered 2023-11-19 – 2023-11-22 (×4): 100 mg via ORAL
  Administered 2023-11-22 – 2023-11-23 (×2): 50 mg via ORAL
  Administered 2023-11-23: 100 mg via ORAL
  Administered 2023-11-23: 50 mg via ORAL
  Administered 2023-11-24: 100 mg via ORAL
  Administered 2023-11-24 – 2023-11-25 (×2): 50 mg via ORAL
  Filled 2023-11-13: qty 1
  Filled 2023-11-13: qty 2
  Filled 2023-11-13: qty 1
  Filled 2023-11-13 (×8): qty 2
  Filled 2023-11-13: qty 1
  Filled 2023-11-13 (×2): qty 2
  Filled 2023-11-13: qty 1
  Filled 2023-11-13 (×3): qty 2
  Filled 2023-11-13 (×2): qty 1
  Filled 2023-11-13: qty 2

## 2023-11-13 NOTE — Progress Notes (Addendum)
 Day 3 of waiting for insurance Auth.  Patient with no complaints.  Please see discharge summary from 6/25. David Bowl DO  [facility]  PROGRESS NOTE  David [name] FMW:[id number] DOB: 1946/03/10 DOA: 10/29/2023 PCP: Rexanne Ingle, MD      Assessment/Plan:   Acute on Chronic combined systolic/diastolic  CHF exacerbation  with associate acute hypoxic respiratory failure   Last echo from December 2024 with a EF of 45 to 40 to 45% noted. CT chest on admission showed no evidence of acute pulmonary embolism, CHF with pulmonary edema and moderate right and small left pleural effusion, superimposed pneumonia in the right lower lobe not excluded, dilated main pulmonary artery. -Started back on torsemide  20 mg daily - BMP 1 week    CAP presumed bacterial  Completed 5 days of  Rocephin  azithromycin .   RSV COVID and flu negative. Currently requiring 2 L point of oxygen, at baseline Encourage incentive spirometry. Continue neb treatments.    Abnormal Cardiac Enzyme mildly elevated troponin at 32 -presumed due to demand Follow-up echo this admission 35 to 40% EF-outpatient follow-up to make sure medically optimized  Epigastric discomfort/anxiety -Improved after starting Reglan  - Will start Bentyl  10 mg p.o. 3 times daily as needed with meals -Discontinue Amitiza  - On Zoloft  - Can be discharged on Reglan  5 mg p.o. every 8 hours as needed, Zoloft  50 mg daily, Bentyl  10 mg 3 times daily as needed -QTc was checked by EKG on 6/25 and within normal limits   Macrocytic anemia -Hemoglobin dropped to 6.0 on 11/04/2023 -Status post 1 unit PRBC -GI consulted, underwent EGD, which showed nonbleeding gastric ulcer, mucosal changes in duodenum; biopsies obtained - Plan to follow-up with GI as outpatient.  - Patient has been refusing colonoscopy at this time.    Hypertension improved  -continue metoprolol , Bidil     AKI On ckd stage IV-creatinine bumped to 3.58 from 2.06 from 1.97    -Creatinine has improved to 2.14 Started on torsemide  Outpatient BMP    CAD s/p stent  -continue beta-blocker, aspirin  and statin. -no active issues    DMII  hemoglobin A1c 6.0  on Lantus  at home      Chronic diabetic foot ulcer b/l  - Per wound care Unstageable Pressure Injury right and left heel; left and right lateral feet'; all are 100% eschar  Arterial ulceration left medial great toe; 100% clean Pressure Injury POA: Yes Cleanse all foot wounds with saline pat dry Paint all foot wounds with betadine, allow to air dry. Ok to cover with foam. Reapply betadine daily.  Offload heels at all times with prevalon boots Single layer of xeroform to the great toe wound, top with dry dressing or bandaid. Change daily Patient was supposed to follow-up with vascular surgery after discharge however he did not follow-up with them. - Treated with doxycycline  for 10 days  ABI normal on the right and moderate left lower extremity arterial disease.    he was due to have an appointment with vascular as an outpatient but he did not follow-up on the appointment. -Need follow-up with vascular surgery as outpatient -Pain control as able    Disposition -Plan to go to skilled nursing facility        Medications     bisacodyl   10 mg Rectal Once   docusate sodium   100 mg Oral Daily   feeding supplement  237 mL Oral BID BM   insulin  aspart  0-6 Units Subcutaneous TID WC   isosorbide -hydrALAZINE   2 tablet  Oral TID   latanoprost   1 drop Both Eyes QHS   metoCLOPramide  (REGLAN ) injection  5 mg Intravenous Q6H   pantoprazole   40 mg Oral Daily   sertraline   25 mg Oral Daily   torsemide   20 mg Oral Daily   traZODone   100 mg Oral QHS     Data Reviewed:   CBG:  Recent Labs  Lab 11/12/23 1146 11/12/23 1652 11/12/23 2122 11/13/23 0733 11/13/23 1155  GLUCAP 298* 314* 355* 166* 149*    SpO2: 100 % O2 Flow Rate (L/min): 2 L/min    Vitals:   11/12/23 2124 11/13/23 0423 11/13/23 0500  11/13/23 1301  BP: (!) 159/80 114/61  (!) 146/72  Pulse: 78 (!) 52  62  Resp: 18 17  18   Temp: 98.3 F (36.8 C) 98 F (36.7 C)  97.8 F (36.6 C)  TempSrc: Oral Oral  Oral  SpO2: 99% 100%  100%  Weight:   88.2 kg   Height:          Data Reviewed:  Basic Metabolic Panel: Recent Labs  Lab 11/07/23 0419 11/08/23 0357 11/09/23 0344 11/10/23 0903 11/11/23 0426  NA 137 137 136 138 133*  K 3.9 3.9 3.8 4.1 4.1  CL 103 103 103 101 97*  CO2 24 25 26 27 27   GLUCOSE 142* 142* 175* 141* 119*  BUN 43* 40* 40* 41* 40*  CREATININE 2.67* 2.39* 2.44* 2.14* 2.50*  CALCIUM  8.7* 8.4* 8.2* 8.5* 8.4*    CBC: Recent Labs  Lab 11/07/23 0419 11/08/23 0357 11/09/23 0344 11/10/23 0903  WBC 9.5 7.5 7.7 7.2  HGB 7.6* 6.8* 7.4* 8.1*  HCT 24.8* 22.2* 23.5* 25.9*  MCV 108.8* 108.8* 103.5* 104.0*  PLT 225 214 207 207    LFT No results for input(s): AST, ALT, ALKPHOS, BILITOT, PROT, ALBUMIN  in the last 168 hours.    Antibiotics: Anti-infectives (From admission, onward)    Start     Dose/Rate Route Frequency Ordered Stop   11/01/23 2200  doxycycline  (VIBRA -TABS) tablet 100 mg  Status:  Discontinued        100 mg Oral Every 12 hours 11/01/23 0838 11/10/23 1521   10/30/23 2200  cefTRIAXone  (ROCEPHIN ) 2 g in sodium chloride  0.9 % 100 mL IVPB        2 g 200 mL/hr over 30 Minutes Intravenous Every 24 hours 10/30/23 0007 11/03/23 2258   10/30/23 1000  doxycycline  (VIBRAMYCIN ) 100 mg in sodium chloride  0.9 % 250 mL IVPB  Status:  Discontinued        100 mg 125 mL/hr over 120 Minutes Intravenous Every 12 hours 10/30/23 0007 11/01/23 0838   10/29/23 2130  cefTRIAXone  (ROCEPHIN ) 1 g in sodium chloride  0.9 % 100 mL IVPB        1 g 200 mL/hr over 30 Minutes Intravenous  Once 10/29/23 2124 10/29/23 2214   10/29/23 2130  azithromycin  (ZITHROMAX ) 500 mg in sodium chloride  0.9 % 250 mL IVPB        500 mg 250 mL/hr over 60 Minutes Intravenous  Once 10/29/23 2124 10/30/23 0012         DVT prophylaxis: Heparin   Code Status: Full code  Family Communication: Family at bedside   CONSULTS    Subjective   Awaiting insurance authorization  Objective      Status is: Inpatient:         David [name]   [facility] If 7PM-7AM, please contact night-coverage at [email or web address], Office  [phone]   11/13/2023,  4:02 PM  LOS: 14 days

## 2023-11-13 NOTE — TOC Progression Note (Addendum)
 Transition of Care [facility]) - Progression Note    Patient Details  Name: David [name] MRN: [id number] Date of Birth: 11-08-1945  [facility] CM/SW Contact  Tawni CHRISTELLA Eva, LCSW Phone Number: 11/13/2023, 9:32 AM  Clinical Narrative:     Insurance auth still pending. TOC to follow.  ADDEN  2:50pm Pt's insurance shara went to peer to peer, peer to peer [phone] Option 5. Deadline is: 11/16/23 10:00 AM. MD Made aware. TOC to follow.   4:00pm Pt will need updated PT note by 10 am Monday message sent to [facility]. TOC to follow.     Barriers to Discharge: Continued Medical Work up  Expected Discharge Plan and Services         Expected Discharge Date: 11/11/23                                     Social Determinants of Health (SDOH) Interventions SDOH Screenings   Food Insecurity: No Food Insecurity (10/30/2023)  Housing: Low Risk  (10/30/2023)  Transportation Needs: No Transportation Needs (10/30/2023)  Utilities: Not At Risk (10/30/2023)  Alcohol Screen: Low Risk  (05/18/2023)  Financial Resource Strain: Low Risk  (05/18/2023)  Physical Activity: Inactive (08/20/2017)  Social Connections: Socially Integrated (10/30/2023)  Stress: No Stress Concern Present (08/20/2017)  Tobacco Use: Medium Risk (11/07/2023)    Readmission Risk Interventions    11/09/2023   11:30 AM 11/06/2023    8:44 AM  Readmission Risk Prevention Plan  Transportation Screening Complete Complete  Medication Review Oceanographer) Complete Complete  PCP or Specialist appointment within 3-5 days of discharge Complete   HRI or Home Care Consult Complete Complete  SW Recovery Care/Counseling Consult Complete Complete  Palliative Care Screening Not Applicable Not Applicable  Skilled Nursing Facility Complete Not Applicable

## 2023-11-13 NOTE — Plan of Care (Signed)
 Per Case Manager:  Pt will need updated PT note by 10 am Monday message sent to [facility]. TOC to follow.    No acute changes this shift

## 2023-11-13 NOTE — Progress Notes (Signed)
 Called to do peer to peer with insurance company.  They would like an updated note from the therapy to show that patient can get out of bed and mobilize with a walker to approve skilled nursing facility placement.  They had concerns that pain prevented patient from ambulating on 6/26.  Window for this appeal is open until Monday at 11 AM.  Case management updated. Patient has PRN ultram   Kynzi Levay DO

## 2023-11-13 NOTE — Plan of Care (Signed)
  Problem: Coping: Goal: Ability to adjust to condition or change in health will improve Outcome: Adequate for Discharge   Problem: Health Behavior/Discharge Planning: Goal: Ability to identify and utilize available resources and services will improve Outcome: Adequate for Discharge   Problem: Health Behavior/Discharge Planning: Goal: Ability to manage health-related needs will improve Outcome: Adequate for Discharge

## 2023-11-13 NOTE — Inpatient Diabetes Management (Signed)
 Inpatient Diabetes Program Recommendations  AACE/ADA: New Consensus Statement on Inpatient Glycemic Control (2015)  Target Ranges:  Prepandial:   less than 140 mg/dL      Peak postprandial:   less than 180 mg/dL (1-2 hours)      Critically ill patients:  140 - 180 mg/dL   Lab Results  Component Value Date   GLUCAP 166 (H) 11/13/2023   HGBA1C 6.0 (H) 10/30/2023    Review of Glycemic Control  Latest Reference Range & Units 11/11/23 20:33 11/12/23 07:38 11/12/23 11:46 11/12/23 16:52 11/12/23 21:22 11/13/23 07:33  Glucose-Capillary 70 - 99 mg/dL 858 (H) 844 (H) 701 (H) 314 (H) 355 (H) 166 (H)  (H): Data is abnormally high Diabetes history: Type 2 DM Outpatient Diabetes medications: Semglee  15 units QD Current orders for Inpatient glycemic control: Novolog  0-6 units TID  Inpatient Diabetes Program Recommendations:    Consider adding Novolog  2 units TID (assuming patient is consuming >50% of meals)  Thanks, Tinnie Minus, MSN, RNC-OB Diabetes Coordinator [phone] (8a-5p)

## 2023-11-14 DIAGNOSIS — I509 Heart failure, unspecified: Secondary | ICD-10-CM | POA: Diagnosis not present

## 2023-11-14 LAB — GLUCOSE, CAPILLARY
Glucose-Capillary: 204 mg/dL — ABNORMAL HIGH (ref 70–99)
Glucose-Capillary: 158 mg/dL — ABNORMAL HIGH (ref 70–99)
Glucose-Capillary: 127 mg/dL — ABNORMAL HIGH (ref 70–99)

## 2023-11-14 NOTE — Progress Notes (Addendum)
 9194- Patient refused morning blood sugar check. Aggravated and combative. Will re attempt at a later time when breakfast tray is delivered    1200- Patient continues to refuse blood glucose checks, refusing mobility/turns, and wound care. Educated patient on importance of off loading with current sacral wound and wound dressing changes. Continued to refuse wound care and turns  1430- Patient was agreeable to PT - transferred to chair with use of Sera Steady. Will use lift to transfer back to bed. RN was able to change sacral mepilix and assess sacral wound with mobility

## 2023-11-14 NOTE — Progress Notes (Signed)
 PROGRESS NOTE    David [name]  FMW:[id number] DOB: 1945-10-06 DOA: 10/29/2023 PCP: Rexanne Ingle, MD    Brief Narrative: 78 y.o. male with medical history significant of  CAD s/p stent, Combined systolic diastolic heart failure, CKDIII, DmII, GERD, HTN  who presents to ED BIBEMS  with complaint of sob x 2 days.  ON arrival EMS placed patient on NRB and transported to ED. patient was admitted to the hospital in April for symptomatic anemia transferred 2 units of blood.  It was deemed to be related to anemia of chronic disease.  Patient was admitted for the treatment of CHF and pneumonia. Chest x-ray showed cardiomegaly vascular congestion and small right pleural effusion with CT of the chest shows negative for PE, shows pulmonary edema, pneumonia in the right lower lobe.  Pulmonary hypertension.  Assessment & Plan:   Principal Problem:   CHF (congestive heart failure) (HCC) Active Problems:   Pneumonia due to infectious organism  Acute on Chronic combined systolic/diastolic  CHF exacerbation  with associate acute hypoxic respiratory failure   Last echo from December 2024 with a EF of 45 to 40 to 45% noted. CT chest on admission showed no evidence of acute pulmonary embolism, CHF with pulmonary edema and moderate right and small left pleural effusion, superimposed pneumonia in the right lower lobe not excluded, dilated main pulmonary artery. -Continue  torsemide  20 mg daily   CAP presumed bacterial  Completed 5 days of  Rocephin  azithromycin .   RSV COVID and flu negative. Currently requiring 2 L of oxygen, at baseline Encourage incentive spirometry. Continue neb treatments.    Abnormal Cardiac Enzyme mildly elevated troponin at 32 -presumed due to demand Follow-up echo this admission 35 to 40% EF   Epigastric discomfort/anxiety -Improved after starting Reglan  - Will start Bentyl  10 mg p.o. 3 times daily as needed with meals -Discontinue Amitiza  - Started on Zoloft  50 mg  daily - Can be discharged on Reglan  5 mg p.o. every 8 hours as needed, Zoloft  50 mg daily, Bentyl  10 mg 3 times daily as needed   Macrocytic anemia -Hemoglobin dropped to 6.0 on 11/04/2023 -Status post 1 unit PRBC -Hemoglobin 8.1 6/24 -GI consulted, underwent EGD, which showed nonbleeding gastric ulcer, mucosal changes in duodenum; biopsies obtained - Plan to follow-up with GI as outpatient.  -Gastric biopsy shows chronic active gastritis with ulcer and purulent exudates, iron  deposits suggestive of iron  pill gastritis, negative for intestinal metaplasia or dysplasia.  Duodenal biopsy negative for dysplasia or malignancy. -H. pylori pending - Patient has been refusing colonoscopy at this time.     Hypertension improved  -continue metoprolol , Bidil     AKI On ckd stage IV-resolved.  Last creatinine we have is 2.50 from 6/25.  Continue torsemide .  Follow-up labs in AM.  CAD s/p stent  -continue beta-blocker, aspirin  and statin. -no active issues    DMII  hemoglobin A1c 6.0  on Lantus  at home   Chronic diabetic foot ulcer b/l  - Per wound care Unstageable Pressure Injury right and left heel; left and right lateral feet'; all are 100% eschar  Arterial ulceration left medial great toe; 100% clean Pressure Injury POA: Yes Cleanse all foot wounds with saline pat dry Paint all foot wounds with betadine, allow to air dry. Ok to cover with foam. Reapply betadine daily.  Offload heels at all times with prevalon boots Single layer of xeroform to the great toe wound, top with dry dressing or bandaid. Change daily Patient was supposed to follow-up  with vascular surgery after discharge however he did not follow-up with them. - Treated with doxycycline  for 10 days  ABI normal on the right and moderate left lower extremity arterial disease.    he was due to have an appointment with vascular as an outpatient but he did not follow-up on the appointment. -Need follow-up with vascular surgery as  outpatient     Disposition -Plan to go to skilled nursing facility Pressure Injury 05/12/23 Coccyx Mid Stage 2 -  Partial thickness loss of dermis presenting as a shallow open injury with a red, pink wound bed without slough. (Active)  05/12/23 0400  Location: Coccyx  Location Orientation: Mid  Staging: Stage 2 -  Partial thickness loss of dermis presenting as a shallow open injury with a red, pink wound bed without slough.  Wound Description (Comments):   DO NOT USE:  Present on Admission: Yes  Dressing Type Gauze (Comment);Foam - Lift dressing to assess site every shift 11/13/23 0800     Pressure Injury 05/12/23 Heel Left Deep Tissue Pressure Injury - Purple or maroon localized area of discolored intact skin or blood-filled blister due to damage of underlying soft tissue from pressure and/or shear. (Active)  05/12/23 0400  Location: Heel  Location Orientation: Left  Staging: Deep Tissue Pressure Injury - Purple or maroon localized area of discolored intact skin or blood-filled blister due to damage of underlying soft tissue from pressure and/or shear.  Wound Description (Comments):   DO NOT USE:  Present on Admission: Yes  Dressing Type Gauze (Comment) 11/13/23 0800    Estimated body mass index is 25.22 kg/m as calculated from the following:   Height as of this encounter: 6' 2 (1.88 m).   Weight as of this encounter: 89.1 kg.  DVT prophylaxis:heparin  Code Status: full Family Communication:none Disposition Plan:  Status is: Inpatient Remains inpatient appropriate because: medically stable for dc No further inpatient work up planned Await SNF PEER TO PEER requested updated PT eval    Subjective:  Resting in bed  No events overnight  Objective: Vitals:   11/13/23 1301 11/13/23 2115 11/14/23 0500 11/14/23 0611  BP: (!) 146/72 (!) 149/71  130/66  Pulse: 62 65  (!) 55  Resp: 18 19  19   Temp: 97.8 F (36.6 C) 98.5 F (36.9 C)  97.8 F (36.6 C)  TempSrc: Oral Oral   Oral  SpO2: 100% 98%  100%  Weight:   89.1 kg   Height:        Intake/Output Summary (Last 24 hours) at 11/14/2023 1020 Last data filed at 11/14/2023 0600 Gross per 24 hour  Intake --  Output 1100 ml  Net -1100 ml   Filed Weights   11/12/23 0429 11/13/23 0500 11/14/23 0500  Weight: 89 kg 88.2 kg 89.1 kg    Examination:  General exam: Appears in nad Respiratory system: Clear to auscultation. Respiratory effort normal. Cardiovascular system:reg Gastrointestinal system: Abdomen is nondistended, soft and nontender. No organomegaly or masses felt. Normal bowel sounds heard. Central nervous system: Alert and oriented. No focal neurological deficits. Extremities:  Chronic ischemic changes in both feet amputation of multiple toes noted.    Data Reviewed: I have personally reviewed following labs and imaging studies  CBC: Recent Labs  Lab 11/08/23 0357 11/09/23 0344 11/10/23 0903  WBC 7.5 7.7 7.2  HGB 6.8* 7.4* 8.1*  HCT 22.2* 23.5* 25.9*  MCV 108.8* 103.5* 104.0*  PLT 214 207 207   Basic Metabolic Panel: Recent Labs  Lab 11/08/23 0357  11/09/23 0344 11/10/23 0903 11/11/23 0426  NA 137 136 138 133*  K 3.9 3.8 4.1 4.1  CL 103 103 101 97*  CO2 25 26 27 27   GLUCOSE 142* 175* 141* 119*  BUN 40* 40* 41* 40*  CREATININE 2.39* 2.44* 2.14* 2.50*  CALCIUM  8.4* 8.2* 8.5* 8.4*   GFR: Estimated Creatinine Clearance: 29.2 mL/min (A) (by C-G formula based on SCr of 2.5 mg/dL (H)). Liver Function Tests: No results for input(s): AST, ALT, ALKPHOS, BILITOT, PROT, ALBUMIN  in the last 168 hours. No results for input(s): LIPASE, AMYLASE in the last 168 hours. No results for input(s): AMMONIA in the last 168 hours. Coagulation Profile: No results for input(s): INR, PROTIME in the last 168 hours. Cardiac Enzymes: No results for input(s): CKTOTAL, CKMB, CKMBINDEX, TROPONINI in the last 168 hours. BNP (last 3 results) No results for input(s): PROBNP in  the last 8760 hours. HbA1C: No results for input(s): HGBA1C in the last 72 hours. CBG: Recent Labs  Lab 11/12/23 2122 11/13/23 0733 11/13/23 1155 11/13/23 1644 11/13/23 2116  GLUCAP 355* 166* 149* 229* 250*   Lipid Profile: No results for input(s): CHOL, HDL, LDLCALC, TRIG, CHOLHDL, LDLDIRECT in the last 72 hours. Thyroid  Function Tests: No results for input(s): TSH, T4TOTAL, FREET4, T3FREE, THYROIDAB in the last 72 hours. Anemia Panel: No results for input(s): VITAMINB12, FOLATE, FERRITIN, TIBC, IRON , RETICCTPCT in the last 72 hours. Sepsis Labs: No results for input(s): PROCALCITON, LATICACIDVEN in the last 168 hours.  No results found for this or any previous visit (from the past 240 hours).    Radiology Studies: No results found.  Scheduled Meds:  bisacodyl   10 mg Rectal Once   docusate sodium   100 mg Oral Daily   feeding supplement  237 mL Oral BID BM   insulin  aspart  0-6 Units Subcutaneous TID WC   isosorbide -hydrALAZINE   2 tablet Oral TID   latanoprost   1 drop Both Eyes QHS   metoCLOPramide  (REGLAN ) injection  5 mg Intravenous Q6H   pantoprazole   40 mg Oral Daily   sertraline   25 mg Oral Daily   torsemide   20 mg Oral Daily   traZODone   100 mg Oral QHS   Continuous Infusions:   LOS: 15 days    Time spent: 30 min  Almarie KANDICE Hoots, MD  11/14/2023, 10:20 AM

## 2023-11-14 NOTE — Plan of Care (Signed)
  Problem: Coping: Goal: Ability to adjust to condition or change in health will improve Outcome: Progressing   Problem: Health Behavior/Discharge Planning: Goal: Ability to manage health-related needs will improve Outcome: Progressing   Problem: Safety: Goal: Ability to remain free from injury will improve Outcome: Progressing   Problem: Skin Integrity: Goal: Risk for impaired skin integrity will decrease Outcome: Progressing

## 2023-11-14 NOTE — Progress Notes (Deleted)
 [name]

## 2023-11-14 NOTE — Progress Notes (Addendum)
 Patient David [name] is alert and oriented x 4.  Patient [name] declined wound treatment.  Education provided by [employer], Charity fundraiser.  Patient continued to decline.

## 2023-11-14 NOTE — Progress Notes (Signed)
 Physical Therapy Treatment Patient Details Name: David [name] MRN: [id number] DOB: [birth year] Today's Date: 11/14/2023   History of Present Illness Pt is a 78 y.o. male admitted 10/29/23 for Acute on Chronic combined systolic/diastolic CHF exacerbation with associate acute hypoxic respiratory failure. Past medical history significant of CAD s/p stent, Combined systolic diastolic heart failure, CKD, GERD, HTN , anemia of chronic illness, DM II, neuropathy    PT Comments  AxO x 3 initially not willing to participate stating I have not been able to walk since January.  At best, he was briefly able to stand in the parallel bars at [facility]. Mostly, he is wheelchair bound and transfering via lateral scoot.  NOT standing. Started asking him about his medical history, he was open and engaging.  Biggest issues are his LEFT knee and Neuropathy all extremities.  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  He agreed to try but was hesitant due to competency/skill level of staff and fear of causing more pain esp in his L knee. Assisted with session was his RN and Rehab Tech + Therapist. Getting to EOB was very difficult. See Bed Mobility Used STEDY to stand.  See below Transfers Rec MAXI MOVE for nursing staff Prior to admit, Pt was home one day after signing himself out of [facility] He has been NON AMB since January 2025.  At best, he was able to self laterally scoot to his wheelchair.  His Spouse is small and physically unable to assist.   Pt is agreeing to return to SNF (NOT [facility]) in hopes to regain his ability to self transfer into his wheelchair.  Pt will need ST Rehab at SNF to address mobility and functional decline prior to safely returning home.    If plan is discharge home, recommend the following: Two people to help with walking and/or transfers;A lot of help  with bathing/dressing/bathroom;Assistance with cooking/housework;Assist for transportation   Can travel by private vehicle     No  Equipment Recommendations  Hospital bed;Hoyer lift    Recommendations for Other Services       Precautions / Restrictions Precautions Precautions: Fall Precaution/Restrictions Comments: pt reports 2L O2 [place] baseline as needed, wears left knee sleeve/brace, Prevalon boots for bil heel pressure injuries plus Neuropathy all extremities.  Wearing welders gloves to keep his hands warm. Restrictions Weight Bearing Restrictions Per Provider Order: No     Mobility  Bed Mobility Overal bed mobility: Needs Assistance Bed Mobility: Supine to Sit     Supine to sit: Max assist     General bed mobility comments: Removed PRAFO's (Booties) and applied large grip socks.  Pt lack R big Toe and L pinky toe.  Pt c/o MAX Neuropathy pain B LE.  Required MAX Assist to transfer to EOB.  VERY rigid throughout.  Severe RIGHT lean requiring Max Assist for support.  Great difficulty achieving B hip and knee flexion.  Max extensor tone.  Had to elevated bed to nearly all the way to get Pt sitting in a close to midline plane.    Transfers Overall transfer level: Needs assistance   Transfers: Sit to/from Stand             General transfer comment: Required + 3 assist (side by side plus long rolled bed sheet around Pt's low back to pull hips forward while he stood)  Poor trunk control  present with MAX RIGHT lean and forward with head downward) Assist with sit to stand twice such that RN could change buttock bandage and perform skin assesment.  STEDY flaps placed down and rolled to recliner still requiring upper body assist for balance.  Third sit to stand to remove STEDY flaps and assist with lowering into recliner.  MAX c/o LEFT pain having to bend during activity.  Positioned in recliner with multiple pillows.  Rec MAXI MOVE back to bed.  Lift pad under Pt. Transfer via Lift  Equipment: Stedy  Ambulation/Gait               General Gait Details: non amb since January (stated Pt)   Stairs             Wheelchair Mobility     Tilt Bed    Modified Rankin (Stroke Patients Only)       Balance                                            Communication    Cognition Arousal: Alert Behavior During Therapy: WFL for tasks assessed/performed   PT - Cognitive impairments: No apparent impairments                       PT - Cognition Comments: AxO x 3 initially not willing to participate stating I have not been able to walk since January.  At best, he was briefly able to stand in the parallel bars at [facility]. Mostly, he is wheelchair bound and transfering via lateral scoot.  NOT standing. Started asking him about his medical history, he was open and engaging.  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  He agreed to try but was hesitant due to competency/skill level of staff and fear of causing more pain esp in his L knee.        Cueing    Exercises      General Comments        Pertinent Vitals/Pain Pain Assessment Pain Assessment: 0-10 Pain Score: 10-Worst pain ever Pain Location: L knee with WBing Pain Descriptors / Indicators: Grimacing, Guarding, Sore Pain Intervention(s): Monitored during session, Patient requesting pain meds-RN notified, Repositioned    Home Living                          Prior Function            PT Goals (current goals can now be found in the care plan section) Progress towards PT goals: Progressing toward goals    Frequency    Min 2X/week      PT Plan      Co-evaluation              AM-PAC PT 6 Clicks Mobility   Outcome Measure  Help needed turning from your back to your side while in a flat bed without using bedrails?:  Total Help needed moving from lying on your back to sitting on the side of a flat bed without using bedrails?: Total Help needed moving to and from a bed to a chair (including a wheelchair)?: Total Help needed standing up from a chair using your arms (e.g., wheelchair or bedside chair)?: Total  Help needed to walk in hospital room?: Total Help needed climbing 3-5 steps with a railing? : Total 6 Click Score: 6    End of Session Equipment Utilized During Treatment: Gait belt Activity Tolerance: Patient tolerated treatment well Patient left: in chair;with call bell/phone within reach;with chair alarm set Nurse Communication: Mobility status PT Visit Diagnosis: Muscle weakness (generalized) (M62.81);Other abnormalities of gait and mobility (R26.89)     Time: 1412-1440 PT Time Calculation (min) (ACUTE ONLY): 28 min  Charges:    $Therapeutic Activity: 23-37 mins PT General Charges $$ ACUTE PT VISIT: 1 Visit                    Katheryn Leap  PTA Acute  Rehabilitation Services Office M-F          [phone]

## 2023-11-15 DIAGNOSIS — I5043 Acute on chronic combined systolic (congestive) and diastolic (congestive) heart failure: Secondary | ICD-10-CM | POA: Diagnosis not present

## 2023-11-15 LAB — CBC
HCT: 28.6 % — ABNORMAL LOW (ref 39.0–52.0)
Hemoglobin: 8.7 g/dL — ABNORMAL LOW (ref 13.0–17.0)
MCH: 33 pg (ref 26.0–34.0)
MCHC: 30.4 g/dL (ref 30.0–36.0)
MCV: 108.3 fL — ABNORMAL HIGH (ref 80.0–100.0)
Platelets: 226 10*3/uL (ref 150–400)
RBC: 2.64 MIL/uL — ABNORMAL LOW (ref 4.22–5.81)
RDW: 21 % — ABNORMAL HIGH (ref 11.5–15.5)
WBC: 8.8 10*3/uL (ref 4.0–10.5)
nRBC: 0 % (ref 0.0–0.2)

## 2023-11-15 LAB — COMPREHENSIVE METABOLIC PANEL WITH GFR
ALT: 18 U/L (ref 0–44)
AST: 24 U/L (ref 15–41)
Albumin: 2.7 g/dL — ABNORMAL LOW (ref 3.5–5.0)
Alkaline Phosphatase: 81 U/L (ref 38–126)
Anion gap: 11 (ref 5–15)
BUN: 52 mg/dL — ABNORMAL HIGH (ref 8–23)
CO2: 26 mmol/L (ref 22–32)
Calcium: 8.6 mg/dL — ABNORMAL LOW (ref 8.9–10.3)
Chloride: 99 mmol/L (ref 98–111)
Creatinine, Ser: 2.42 mg/dL — ABNORMAL HIGH (ref 0.61–1.24)
GFR, Estimated: 27 mL/min — ABNORMAL LOW (ref >60)
Glucose, Bld: 185 mg/dL — ABNORMAL HIGH (ref 70–99)
Potassium: 3.7 mmol/L (ref 3.5–5.1)
Sodium: 136 mmol/L (ref 135–145)
Total Bilirubin: 0.6 mg/dL (ref 0.0–1.2)
Total Protein: 7.3 g/dL (ref 6.5–8.1)

## 2023-11-15 LAB — GLUCOSE, CAPILLARY
Glucose-Capillary: 176 mg/dL — ABNORMAL HIGH (ref 70–99)
Glucose-Capillary: 136 mg/dL — ABNORMAL HIGH (ref 70–99)
Glucose-Capillary: 114 mg/dL — ABNORMAL HIGH (ref 70–99)
Glucose-Capillary: 147 mg/dL — ABNORMAL HIGH (ref 70–99)

## 2023-11-15 NOTE — Progress Notes (Signed)
 PROGRESS NOTE  David [name] FMW:[id number] DOB: Jun 09, 1945 DOA: 10/29/2023 PCP: Rexanne Ingle, MD   LOS: 16 days   Brief Narrative / Interim history: 78 year old male with CAD status post stenting, combined CHF, CKD 4, DM2, HTN who was admitted to the hospital with shortness of breath of several days.  He apparently required nonrebreather upon being transported to the ER.  On admission imaging showed vascular congestion, small right pleural effusion with concern for fluid overload.  He was admitted to the hospital and diuresed.  SNF is pending  Subjective / 24h Interval events: Feels like his breathing is improving.  Assesement and Plan: Principal problem Acute on chronic combined CHF, acute hypoxic respiratory failure-imaging on admission of the CT of the chest did not show any PE but it did show CHF with pulmonary edema and bilateral pleural effusions.  Has been diuresed and now placed on torsemide , continue.  He is net -5.1 L, now reports feeling much better and close to baseline - Continue Imdur as well  Active problems CAP-RSV, Covid, flu all negative.  Status post 5 days of ceftriaxone  azithromycin .  Respiratory status stable  Troponin elevation-flat, not in a pattern consistent with ACS, likely demand ischemia in the setting of CHF  Epigastric discomfort /intermittent anxiety-improved after Reglan .  Can be discharged on Reglan  PRN, Zoloft , Bentyl  as well  Macrocytic anemia -hemoglobin dipped to 6.0 on 6/18, received 1 unit of packed red blood cells.  Of note, he was hospitalized earlier on with symptomatic anemia.  GI consulted and evaluated patient, underwent an EGD which showed nonbleeding gastric ulcer, mucosal changes in the duodenal, and biopsies showed chronic active gastritis with ulcer and purulent exudates, iron  deposits suggestive of iron  pill gastritis, negative for intestinal metaplasia or dysplasia. Duodenal biopsy negative for dysplasia or malignancy.  -colonoscopy  was offered but he declined  Essential hypertension-continue Imdur  Acute kidney injury on CKD stage IV -baseline creatinine between 2 and 3, it was as high as 3.58 on 6/18.  Has improved meanwhile and now back to baseline around 2.4  CAD s/p stent -no chest discomfort   DMII - hemoglobin A1c 6.0.   CBG (last 3)  Recent Labs    11/14/23 1544 11/14/23 2014 11/15/23 0743  GLUCAP 158* 127* 176*     Chronic diabetic foot ulcer b/l - Per wound care Unstageable Pressure Injury right and left heel; left and right lateral feet'; all are 100% eschar  Arterial ulceration left medial great toe; 100% clean Pressure Injury POA: Yes Cleanse all foot wounds with saline pat dry Paint all foot wounds with betadine, allow to air dry. Ok to cover with foam. Reapply betadine daily.  Offload heels at all times with prevalon boots Single layer of xeroform to the great toe wound, top with dry dressing or bandaid. Change daily Patient was supposed to follow-up with vascular surgery after discharge however he did not follow-up with them. - Treated with doxycycline  for 10 days  ABI normal on the right and moderate left lower extremity arterial disease.    he was due to have an appointment with vascular as an outpatient but he did not follow-up on the appointment. -Need follow-up with vascular surgery as outpatient   Scheduled Meds:  bisacodyl   10 mg Rectal Once   docusate sodium   100 mg Oral Daily   feeding supplement  237 mL Oral BID BM   insulin  aspart  0-6 Units Subcutaneous TID WC   isosorbide -hydrALAZINE   2 tablet Oral TID  latanoprost   1 drop Both Eyes QHS   metoCLOPramide  (REGLAN ) injection  5 mg Intravenous Q6H   pantoprazole   40 mg Oral Daily   sertraline   25 mg Oral Daily   torsemide   20 mg Oral Daily   traZODone   100 mg Oral QHS   Continuous Infusions: PRN Meds:.acetaminophen  **OR** acetaminophen , albuterol , bisacodyl , dicyclomine , guaiFENesin -dextromethorphan, ondansetron  (ZOFRAN )  IV, simethicone , traMADol   Current Outpatient Medications  Medication Instructions   acetaminophen  (TYLENOL ) 1,000 mg, Oral, 2 times daily   albuterol  (PROVENTIL  HFA;VENTOLIN  HFA) 108 (90 Base) MCG/ACT inhaler 2 puffs, Inhalation, Every 6 hours PRN   [Paused] amLODipine  (NORVASC ) 10 mg, Oral, Daily   aspirin  EC 81 mg, Oral, Daily   atorvastatin  (LIPITOR ) 80 mg, Oral, Daily at bedtime   bisacodyl  (DULCOLAX) 10 mg, Rectal, Daily PRN   bismuth subsalicylate (PEPTO BISMOL) 262 MG/15ML suspension 30 mLs, Oral, Daily PRN   cholecalciferol  (VITAMIN D3) 5,000 Units, Oral, Daily   cyanocobalamin (VITAMIN B12) 2,000 mcg, Oral, Daily   dextromethorphan-guaiFENesin  (MUCINEX  DM) 30-600 MG 12hr tablet 1 tablet, Oral, 2 times daily   dicyclomine  (BENTYL ) 10 mg, Oral, 4 times daily   docusate sodium  (COLACE) 100 mg, Oral, 2 times daily   ferrous sulfate  325 mg, Oral, Daily with breakfast   gabapentin  (NEURONTIN ) 200 mg, Oral, 3 times daily   insulin  aspart (NOVOLOG ) 0-6 Units, Subcutaneous, 3 times daily with meals   insulin  glargine-yfgn (SEMGLEE ) 15 Units, Subcutaneous, Daily   isosorbide -hydrALAZINE  (BIDIL ) 20-37.5 MG tablet 2 tablets, Oral, 3 times daily   Lantus  SoloStar 12 Units, Subcutaneous, Daily   latanoprost  (XALATAN ) 0.005 % ophthalmic solution 1 drop, Both Eyes, Daily at bedtime   leptospermum manuka honey (MEDIHONEY) PSTE paste 1 Application, Topical, Daily, Apply to buttocks and left heel as directed   lubiprostone  (AMITIZA ) 24 mcg, Daily   melatonin 3 mg, Oral, Daily at bedtime   methocarbamol  (ROBAXIN ) 500 mg, Oral, Every 6 hours PRN   metoCLOPramide  (REGLAN ) 5 mg, Oral, 3 times daily   [Paused] metoprolol  succinate (TOPROL -XL) 100 mg, Oral, Daily   Multiple Vitamin (MULTIVITAMIN WITH MINERALS) TABS tablet 1 tablet, Oral, Daily   nitroGLYCERIN  (NITROSTAT ) 0.4 mg, Sublingual, Every 5 min PRN   ondansetron  (ZOFRAN ) 4 mg, Oral, Every 6 hours PRN   OXYGEN 3 L/min, Inhalation, Continuous    pantoprazole  (PROTONIX ) 40 mg, Oral, Daily   polyethylene glycol powder (GLYCOLAX /MIRALAX ) 17 GM/SCOOP powder Take 1 capful (17 g) by mouth daily.   pyridoxine  (B-6) 100 mg, Oral, Daily   sertraline  (ZOLOFT ) 25 mg, Daily   torsemide  (DEMADEX ) 20 mg, Oral, Daily   traMADol  (ULTRAM ) 50 mg, Oral, 3 times daily PRN   traZODone  (DESYREL ) 100 mg, Oral, Daily at bedtime   UNABLE TO FIND 30 mLs, Oral, 2 times daily, Med Name: protein liquid     Diet Orders (From admission, onward)     Start     Ordered   11/11/23 0000  Diet - low sodium heart healthy        11/11/23 0841   11/11/23 0000  Diet Carb Modified        11/11/23 0841   11/07/23 1018  DIET SOFT Fluid consistency: Thin  Diet effective now       Question:  Fluid consistency:  Answer:  Thin   11/07/23 1017            DVT prophylaxis:    Lab Results  Component Value Date   PLT 226 11/15/2023      Code Status:  Full Code  Family Communication: wife at bedside   Status is: Inpatient Remains inpatient appropriate because: severity of illness  Level of care: Med-Surg  Consultants:  GI  Objective: Vitals:   11/14/23 1546 11/14/23 2018 11/15/23 0500 11/15/23 0528  BP: 137/69 (!) 159/89  124/67  Pulse: 95 62  63  Resp: 18 19  17   Temp: 98.7 F (37.1 C) 97.9 F (36.6 C)  98.4 F (36.9 C)  TempSrc: Oral Oral  Oral  SpO2: 98% 97%  94%  Weight:   89.3 kg   Height:        Intake/Output Summary (Last 24 hours) at 11/15/2023 1114 Last data filed at 11/15/2023 0900 Gross per 24 hour  Intake 240 ml  Output 1700 ml  Net -1460 ml   Wt Readings from Last 3 Encounters:  11/15/23 89.3 kg  09/09/23 87 kg  05/30/23 94.2 kg    Examination:  Constitutional: NAD Eyes: no scleral icterus ENMT: Mucous membranes are moist.  Neck: normal, supple Respiratory: clear to auscultation bilaterally, no wheezing, no crackles.  Cardiovascular: Regular rate and rhythm, no murmurs / rubs / gallops. No LE edema.  Abdomen: non  distended, no tenderness. Bowel sounds positive.  Musculoskeletal: no clubbing / cyanosis.    Data Reviewed: I have independently reviewed following labs and imaging studies   CBC Recent Labs  Lab 11/09/23 0344 11/10/23 0903 11/15/23 0643  WBC 7.7 7.2 8.8  HGB 7.4* 8.1* 8.7*  HCT 23.5* 25.9* 28.6*  PLT 207 207 226  MCV 103.5* 104.0* 108.3*  MCH 32.6 32.5 33.0  MCHC 31.5 31.3 30.4  RDW 22.0* 21.7* 21.0*    Recent Labs  Lab 11/09/23 0344 11/10/23 0903 11/11/23 0426 11/15/23 0643  NA 136 138 133* 136  K 3.8 4.1 4.1 3.7  CL 103 101 97* 99  CO2 26 27 27 26   GLUCOSE 175* 141* 119* 185*  BUN 40* 41* 40* 52*  CREATININE 2.44* 2.14* 2.50* 2.42*  CALCIUM  8.2* 8.5* 8.4* 8.6*  AST  --   --   --  24  ALT  --   --   --  18  ALKPHOS  --   --   --  81  BILITOT  --   --   --  0.6  ALBUMIN   --   --   --  2.7*    ------------------------------------------------------------------------------------------------------------------ No results for input(s): CHOL, HDL, LDLCALC, TRIG, CHOLHDL, LDLDIRECT in the last 72 hours.  Lab Results  Component Value Date   HGBA1C 6.0 (H) 10/30/2023   ------------------------------------------------------------------------------------------------------------------ No results for input(s): TSH, T4TOTAL, T3FREE, THYROIDAB in the last 72 hours.  Invalid input(s): FREET3  Cardiac Enzymes No results for input(s): CKMB, TROPONINI, MYOGLOBIN in the last 168 hours.  Invalid input(s): CK ------------------------------------------------------------------------------------------------------------------    Component Value Date/Time   BNP 650.9 (H) 10/29/2023 1746    CBG: Recent Labs  Lab 11/13/23 2116 11/14/23 1237 11/14/23 1544 11/14/23 2014 11/15/23 0743  GLUCAP 250* 204* 158* 127* 176*    No results found for this or any previous visit (from the past 240 hours).   Radiology Studies: No results  found.   Nilda Fendt, MD, PhD [facility]  Between 7 am - 7 pm I am available, please contact me via Amion (for emergencies) or Securechat (non urgent messages)  Between 7 pm - 7 am I am not available, please contact night coverage MD/APP via Amion

## 2023-11-15 NOTE — TOC Progression Note (Addendum)
 Transition of Care [facility] - Progression Note    Patient Details  Name: David [name] MRN: [id number] Date of Birth: 1946-05-15  [facility]) CM/SW Contact  Heather DELENA Saltness, LCSW Phone Number: 11/15/2023, 5:03 PM  Clinical Narrative:    Pt's insurance authorization for SNF placement was denied. Auth ID: [id number]. CSW informed pt of authorization being denied and number to call to appeal denial, [phone]. CSW advised pt of option to return to [facility] LTC or return home with [facility] services. CSW attempted to speak with [facility] admissions staff, [facility] and [place], but no answer. Pt reports Don't start that again. I'm not going back there (referring to [facility]). TOC to follow up with [facility] admission staff and pt tomorrow. TOC will continue to follow.     Barriers to Discharge: SNF Authorization Denied  Expected Discharge Plan and Services ALF or Home with [facility] services       Expected Discharge Date: 11/11/23                     Social Determinants of Health (SDOH) Interventions SDOH Screenings   Food Insecurity: No Food Insecurity (10/30/2023)  Housing: Low Risk  (10/30/2023)  Transportation Needs: No Transportation Needs (10/30/2023)  Utilities: Not At Risk (10/30/2023)  Alcohol Screen: Low Risk  (05/18/2023)  Financial Resource Strain: Low Risk  (05/18/2023)  Physical Activity: Inactive (08/20/2017)  Social Connections: Socially Integrated (10/30/2023)  Stress: No Stress Concern Present (08/20/2017)  Tobacco Use: Medium Risk (11/07/2023)    Readmission Risk Interventions    11/09/2023   11:30 AM 11/06/2023    8:44 AM  Readmission Risk Prevention Plan  Transportation Screening Complete Complete  Medication Review (RN Care Manager) Complete Complete  PCP or Specialist appointment within 3-5 days of discharge Complete   HRI or Home Care Consult Complete Complete  SW Recovery Care/Counseling Consult Complete Complete  Palliative Care  Screening Not Applicable Not Applicable  Skilled Nursing Facility Complete Not Applicable    Heather Saltness, MSW, LCSW 11/15/2023 5:03 PM

## 2023-11-15 NOTE — TOC Progression Note (Signed)
 Transition of Care [facility]) - Progression Note    Patient Details  Name: David [name] MRN: [id number] Date of Birth: 04-17-1946  Transition of Care [facility]) CM/SW Contact  Heather DELENA Saltness, LCSW Phone Number: 11/15/2023, 9:35 AM  Clinical Narrative:    CSW uploaded updated PT note from 6/28 to [facility] portal for insurance authorization. TOC will continue to follow.     Barriers to Discharge: Continued Medical Work up  Expected Discharge Plan and Services  [facility]       Expected Discharge Date: 11/11/23                   Social Determinants of Health (SDOH) Interventions SDOH Screenings   Food Insecurity: No Food Insecurity (10/30/2023)  Housing: Low Risk  (10/30/2023)  Transportation Needs: No Transportation Needs (10/30/2023)  Utilities: Not At Risk (10/30/2023)  Alcohol Screen: Low Risk  (05/18/2023)  Financial Resource Strain: Low Risk  (05/18/2023)  Physical Activity: Inactive (08/20/2017)  Social Connections: Socially Integrated (10/30/2023)  Stress: No Stress Concern Present (08/20/2017)  Tobacco Use: Medium Risk (11/07/2023)    Readmission Risk Interventions    11/09/2023   11:30 AM 11/06/2023    8:44 AM  Readmission Risk Prevention Plan  Transportation Screening Complete Complete  Medication Review (RN Care Manager) Complete Complete  PCP or Specialist appointment within 3-5 days of discharge Complete   HRI or Home Care Consult Complete Complete  SW Recovery Care/Counseling Consult Complete Complete  Palliative Care Screening Not Applicable Not Applicable  Skilled Nursing Facility Complete Not Applicable    Heather Saltness, MSW, LCSW 11/15/2023 9:36 AM

## 2023-11-16 DIAGNOSIS — E785 Hyperlipidemia, unspecified: Secondary | ICD-10-CM | POA: Diagnosis not present

## 2023-11-16 DIAGNOSIS — J189 Pneumonia, unspecified organism: Secondary | ICD-10-CM | POA: Diagnosis not present

## 2023-11-16 DIAGNOSIS — I509 Heart failure, unspecified: Secondary | ICD-10-CM | POA: Diagnosis not present

## 2023-11-16 DIAGNOSIS — E1169 Type 2 diabetes mellitus with other specified complication: Secondary | ICD-10-CM | POA: Diagnosis not present

## 2023-11-16 LAB — GLUCOSE, CAPILLARY
Glucose-Capillary: 124 mg/dL — ABNORMAL HIGH (ref 70–99)
Glucose-Capillary: 151 mg/dL — ABNORMAL HIGH (ref 70–99)
Glucose-Capillary: 191 mg/dL — ABNORMAL HIGH (ref 70–99)
Glucose-Capillary: 231 mg/dL — ABNORMAL HIGH (ref 70–99)

## 2023-11-16 NOTE — Progress Notes (Signed)
 PROGRESS NOTE  David [name] FMW:[id number] DOB: [birth year] DOA: 10/29/2023 PCP: Rexanne Ingle, MD   LOS: 17 days   Brief Narrative / Interim history: 78 year old male with CAD status post stenting, combined CHF, CKD 4, DM2, HTN who was admitted to the hospital with shortness of breath of several days.  He apparently required nonrebreather upon being transported to the ER.  On admission imaging showed vascular congestion, small right pleural effusion with concern for fluid overload.  He was admitted to the hospital and diuresed.  SNF apparently was denied.  Await plan per TOC for safe discharge  Subjective / 24h Interval events: Sleepy this morning  Assesement and Plan: Principal problem Acute on chronic combined CHF, acute hypoxic respiratory failure-imaging on admission of the CT of the chest did not show any PE but it did show CHF with pulmonary edema and bilateral pleural effusions.  Has been diuresed and now placed on torsemide , continue.  He is net -5.1 L, now reports feeling much better and close to baseline - Continue Imdur as well  Active problems CAP-RSV, Covid, flu all negative.  Status post 5 days of ceftriaxone  azithromycin .  Respiratory status stable  Troponin elevation-flat, not in a pattern consistent with ACS, likely demand ischemia in the setting of CHF  Epigastric discomfort /intermittent anxiety-improved after Reglan .  Can be discharged on Reglan  PRN, Zoloft , Bentyl  as well  Macrocytic anemia -hemoglobin dipped to 6.0 on 6/18, received 1 unit of packed red blood cells.  Of note, he was hospitalized earlier on with symptomatic anemia.  GI consulted and evaluated patient, underwent an EGD which showed nonbleeding gastric ulcer, mucosal changes in the duodenal, and biopsies showed chronic active gastritis with ulcer and purulent exudates, iron  deposits suggestive of iron  pill gastritis, negative for intestinal metaplasia or dysplasia. Duodenal biopsy negative for  dysplasia or malignancy.  -colonoscopy was offered but he declined  Essential hypertension-continue Imdur  Acute kidney injury on CKD stage IV -baseline creatinine between 2 and 3, it was as high as 3.58 on 6/18.  Has improved meanwhile and now back to baseline around 2.4  CAD s/p stent -no chest discomfort   DMII - hemoglobin A1c 6.0.   CBG (last 3)  Recent Labs    11/15/23 2124 11/16/23 0744 11/16/23 1202  GLUCAP 147* 124* 151*     Chronic diabetic foot ulcer b/l - Per wound care Unstageable Pressure Injury right and left heel; left and right lateral feet'; all are 100% eschar  Arterial ulceration left medial great toe; 100% clean Pressure Injury POA: Yes Cleanse all foot wounds with saline pat dry Paint all foot wounds with betadine, allow to air dry. Ok to cover with foam. Reapply betadine daily.  Offload heels at all times with prevalon boots Single layer of xeroform to the great toe wound, top with dry dressing or bandaid. Change daily Patient was supposed to follow-up with vascular surgery after discharge however he did not follow-up with them. - Treated with doxycycline  for 10 days  ABI normal on the right and moderate left lower extremity arterial disease.    he was due to have an appointment with vascular as an outpatient but he did not follow-up on the appointment. -Need follow-up with vascular surgery as outpatient   Scheduled Meds:  bisacodyl   10 mg Rectal Once   docusate sodium   100 mg Oral Daily   feeding supplement  237 mL Oral BID BM   insulin  aspart  0-6 Units Subcutaneous TID WC   isosorbide -hydrALAZINE   2 tablet Oral TID   latanoprost   1 drop Both Eyes QHS   metoCLOPramide  (REGLAN ) injection  5 mg Intravenous Q6H   pantoprazole   40 mg Oral Daily   sertraline   25 mg Oral Daily   torsemide   20 mg Oral Daily   traZODone   100 mg Oral QHS   Continuous Infusions: PRN Meds:.acetaminophen  **OR** acetaminophen , albuterol , bisacodyl , dicyclomine ,  guaiFENesin -dextromethorphan, ondansetron  (ZOFRAN ) IV, simethicone , traMADol   Current Outpatient Medications  Medication Instructions   acetaminophen  (TYLENOL ) 1,000 mg, Oral, 2 times daily   albuterol  (PROVENTIL  HFA;VENTOLIN  HFA) 108 (90 Base) MCG/ACT inhaler 2 puffs, Inhalation, Every 6 hours PRN   [Paused] amLODipine  (NORVASC ) 10 mg, Oral, Daily   aspirin  EC 81 mg, Oral, Daily   atorvastatin  (LIPITOR ) 80 mg, Oral, Daily at bedtime   bisacodyl  (DULCOLAX) 10 mg, Rectal, Daily PRN   bismuth subsalicylate (PEPTO BISMOL) 262 MG/15ML suspension 30 mLs, Oral, Daily PRN   cholecalciferol  (VITAMIN D3) 5,000 Units, Oral, Daily   cyanocobalamin (VITAMIN B12) 2,000 mcg, Oral, Daily   dextromethorphan-guaiFENesin  (MUCINEX  DM) 30-600 MG 12hr tablet 1 tablet, Oral, 2 times daily   dicyclomine  (BENTYL ) 10 mg, Oral, 4 times daily   docusate sodium  (COLACE) 100 mg, Oral, 2 times daily   ferrous sulfate  325 mg, Oral, Daily with breakfast   gabapentin  (NEURONTIN ) 200 mg, Oral, 3 times daily   insulin  aspart (NOVOLOG ) 0-6 Units, Subcutaneous, 3 times daily with meals   insulin  glargine-yfgn (SEMGLEE ) 15 Units, Subcutaneous, Daily   isosorbide -hydrALAZINE  (BIDIL ) 20-37.5 MG tablet 2 tablets, Oral, 3 times daily   Lantus  SoloStar 12 Units, Subcutaneous, Daily   latanoprost  (XALATAN ) 0.005 % ophthalmic solution 1 drop, Both Eyes, Daily at bedtime   leptospermum manuka honey (MEDIHONEY) PSTE paste 1 Application, Topical, Daily, Apply to buttocks and left heel as directed   lubiprostone  (AMITIZA ) 24 mcg, Daily   melatonin 3 mg, Oral, Daily at bedtime   methocarbamol  (ROBAXIN ) 500 mg, Oral, Every 6 hours PRN   metoCLOPramide  (REGLAN ) 5 mg, Oral, 3 times daily   [Paused] metoprolol  succinate (TOPROL -XL) 100 mg, Oral, Daily   Multiple Vitamin (MULTIVITAMIN WITH MINERALS) TABS tablet 1 tablet, Oral, Daily   nitroGLYCERIN  (NITROSTAT ) 0.4 mg, Sublingual, Every 5 min PRN   ondansetron  (ZOFRAN ) 4 mg, Oral, Every 6  hours PRN   OXYGEN 3 L/min, Inhalation, Continuous   pantoprazole  (PROTONIX ) 40 mg, Oral, Daily   polyethylene glycol powder (GLYCOLAX /MIRALAX ) 17 GM/SCOOP powder Take 1 capful (17 g) by mouth daily.   pyridoxine  (B-6) 100 mg, Oral, Daily   sertraline  (ZOLOFT ) 25 mg, Daily   torsemide  (DEMADEX ) 20 mg, Oral, Daily   traMADol  (ULTRAM ) 50 mg, Oral, 3 times daily PRN   traZODone  (DESYREL ) 100 mg, Oral, Daily at bedtime   UNABLE TO FIND 30 mLs, Oral, 2 times daily, Med Name: protein liquid     Diet Orders (From admission, onward)     Start     Ordered   11/11/23 0000  Diet - low sodium heart healthy        11/11/23 0841   11/11/23 0000  Diet Carb Modified        11/11/23 0841   11/07/23 1018  DIET SOFT Fluid consistency: Thin  Diet effective now       Question:  Fluid consistency:  Answer:  Thin   11/07/23 1017            DVT prophylaxis:    Lab Results  Component Value Date   PLT 226 11/15/2023  Code Status: Full Code  Family Communication: wife at bedside   Status is: Inpatient Remains inpatient appropriate because: severity of illness  Level of care: Med-Surg  Consultants:  GI  Objective: Vitals:   11/15/23 2123 11/16/23 0124 11/16/23 0428 11/16/23 0536  BP: 136/75 126/72  (!) 144/74  Pulse: 63 (!) 56  (!) 50  Resp: 16 17  16   Temp: 97.7 F (36.5 C) 97.8 F (36.6 C)  97.8 F (36.6 C)  TempSrc: Oral Oral  Oral  SpO2: 98% 100%  100%  Weight:   89.3 kg   Height:        Intake/Output Summary (Last 24 hours) at 11/16/2023 1317 Last data filed at 11/15/2023 2255 Gross per 24 hour  Intake 240 ml  Output 500 ml  Net -260 ml   Wt Readings from Last 3 Encounters:  11/16/23 89.3 kg  09/09/23 87 kg  05/30/23 94.2 kg    Examination:   General: Appearance:     Overweight male in no acute distress     Lungs:     respirations unlabored  Heart:    Bradycardic. Normal rhythm. No murmurs, rubs, or gallops.      Neurologic:   sleepy      Data  Reviewed: I have independently reviewed following labs and imaging studies   CBC Recent Labs  Lab 11/10/23 0903 11/15/23 0643  WBC 7.2 8.8  HGB 8.1* 8.7*  HCT 25.9* 28.6*  PLT 207 226  MCV 104.0* 108.3*  MCH 32.5 33.0  MCHC 31.3 30.4  RDW 21.7* 21.0*    Recent Labs  Lab 11/10/23 0903 11/11/23 0426 11/15/23 0643  NA 138 133* 136  K 4.1 4.1 3.7  CL 101 97* 99  CO2 27 27 26   GLUCOSE 141* 119* 185*  BUN 41* 40* 52*  CREATININE 2.14* 2.50* 2.42*  CALCIUM  8.5* 8.4* 8.6*  AST  --   --  24  ALT  --   --  18  ALKPHOS  --   --  81  BILITOT  --   --  0.6  ALBUMIN   --   --  2.7*    ------------------------------------------------------------------------------------------------------------------ No results for input(s): CHOL, HDL, LDLCALC, TRIG, CHOLHDL, LDLDIRECT in the last 72 hours.  Lab Results  Component Value Date   HGBA1C 6.0 (H) 10/30/2023   ------------------------------------------------------------------------------------------------------------------ No results for input(s): TSH, T4TOTAL, T3FREE, THYROIDAB in the last 72 hours.  Invalid input(s): FREET3  Cardiac Enzymes No results for input(s): CKMB, TROPONINI, MYOGLOBIN in the last 168 hours.  Invalid input(s): CK ------------------------------------------------------------------------------------------------------------------    Component Value Date/Time   BNP 650.9 (H) 10/29/2023 1746    CBG: Recent Labs  Lab 11/15/23 1136 11/15/23 1617 11/15/23 2124 11/16/23 0744 11/16/23 1202  GLUCAP 136* 114* 147* 124* 151*    No results found for this or any previous visit (from the past 240 hours).   Radiology Studies: No results found.   Harlene Bowl DO [facility]  Between 7 am - 7 pm I am available, please contact me via Amion (for emergencies) or Securechat (non urgent messages)  Between 7 pm - 7 am I am not available, please contact night coverage MD/APP  via Amion

## 2023-11-16 NOTE — Plan of Care (Signed)
  Problem: Coping: Goal: Ability to adjust to condition or change in health will improve Outcome: Progressing   Problem: Health Behavior/Discharge Planning: Goal: Ability to identify and utilize available resources and services will improve Outcome: Progressing   

## 2023-11-16 NOTE — TOC Progression Note (Addendum)
 Transition of Care [facility]) - Progression Note    Patient Details  Name: David [name] MRN: [id number] Date of Birth: 29-Jul-1945  Transition of Care [facility]) CM/SW Contact  Jonuel Butterfield, Glenys DASEN, RN Phone Number:   Clinical Narrative:    CM spoke with patient and spouse [name]) in the room. Patient states left [facility] to return home. Stay home 1 day prior to coming to the hospital. Patient states that he refuses to return to [facility]. Patient is on oxygen at home, not aware of the agency. DME - [facility], [place], [facility]. No HH or SDOH needs. Patient will require ambulance for transportation home at discharge. CM informed patient the need to apply for medicaid and will add to AVS. CM will call [facility] to ask about patients discharge, with permission from patient. TOC will continue to follow for safe discharge.  1:00 PM CM called to patients room due to patients wife showing signs of confusion. With permission from patient, CM called Chole daughter [phone] who lives in [place] and [name] Lunger cousin [phone]. CM called Chole to get [name]'s telephone number. Left message for [name] to return call.  2:00 PM Patients cousin [name] returned call. CM requested that she come to the hospital to discuss care for the patient.  [name] shared the patients home is not appropriate for him to return and that spouse can not care for patient. [name] states that there is mold growing in the home and general filth.   3:00 PM. Patient has oxygen with [employer] ([place]).   Barriers to Discharge: SNF Authorization Denied  Expected Discharge Plan and Services         Expected Discharge Date: 11/11/23                                     Social Determinants of Health (SDOH) Interventions SDOH Screenings   Food Insecurity: No Food Insecurity (10/30/2023)  Housing: Low Risk  (10/30/2023)  Transportation Needs: No Transportation Needs (10/30/2023)  Utilities: Not At Risk (10/30/2023)   Alcohol Screen: Low Risk  (05/18/2023)  Financial Resource Strain: Low Risk  (05/18/2023)  Physical Activity: Inactive (08/20/2017)  Social Connections: Socially Integrated (10/30/2023)  Stress: No Stress Concern Present (08/20/2017)  Tobacco Use: Medium Risk (11/07/2023)    Readmission Risk Interventions    11/09/2023   11:30 AM 11/06/2023    8:44 AM  Readmission Risk Prevention Plan  Transportation Screening Complete Complete  Medication Review [facility]) Complete Complete  PCP or Specialist appointment within 3-5 days of discharge Complete   HRI or Home Care Consult Complete Complete  SW Recovery Care/Counseling Consult Complete Complete  Palliative Care Screening Not Applicable Not Applicable  Skilled Nursing Facility Complete Not Applicable

## 2023-11-16 NOTE — TOC Progression Note (Signed)
 Transition of Care [facility]) - Progression Note    Patient Details  Name: David [name] MRN: [id number] Date of Birth: 1946-03-08  Transition of Care [facility]) CM/SW Contact  Heather DELENA Saltness, LCSW Phone Number: 11/16/2023, 8:57 AM  Clinical Narrative:    CSW spoke with Tammy at [facility] Per Tammy, pt cannot return to [facility] due to signing himself out on 6/11. Tammy also reports pt owes a substantial amount of money. TOC will assist with setting up [facility] PT/OT and discharge home with wife.     Barriers to Discharge: SNF Authorization Denied  Expected Discharge Plan and Services  Home       Expected Discharge Date: 11/11/23                     Social Determinants of Health (SDOH) Interventions SDOH Screenings   Food Insecurity: No Food Insecurity (10/30/2023)  Housing: Low Risk  (10/30/2023)  Transportation Needs: No Transportation Needs (10/30/2023)  Utilities: Not At Risk (10/30/2023)  Alcohol Screen: Low Risk  (05/18/2023)  Financial Resource Strain: Low Risk  (05/18/2023)  Physical Activity: Inactive (08/20/2017)  Social Connections: Socially Integrated (10/30/2023)  Stress: No Stress Concern Present (08/20/2017)  Tobacco Use: Medium Risk (11/07/2023)    Readmission Risk Interventions    11/09/2023   11:30 AM 11/06/2023    8:44 AM  Readmission Risk Prevention Plan  Transportation Screening Complete Complete  Medication Review (RN Care Manager) Complete Complete  PCP or Specialist appointment within 3-5 days of discharge Complete   HRI or Home Care Consult Complete Complete  SW Recovery Care/Counseling Consult Complete Complete  Palliative Care Screening Not Applicable Not Applicable  Skilled Nursing Facility Complete Not Applicable    Heather Saltness, MSW, LCSW 11/16/2023 9:01 AM

## 2023-11-16 NOTE — Care Management Important Message (Signed)
 Important Message  Patient Details IM Letter given. Name: David [name] MRN: [id number] Date of Birth: [birth year]   Important Message Given:  Yes - Medicare IM     [name] 11/16/2023, 12:10 PM

## 2023-11-17 DIAGNOSIS — E785 Hyperlipidemia, unspecified: Secondary | ICD-10-CM | POA: Diagnosis not present

## 2023-11-17 DIAGNOSIS — E1169 Type 2 diabetes mellitus with other specified complication: Secondary | ICD-10-CM | POA: Diagnosis not present

## 2023-11-17 DIAGNOSIS — I509 Heart failure, unspecified: Secondary | ICD-10-CM | POA: Diagnosis not present

## 2023-11-17 DIAGNOSIS — J189 Pneumonia, unspecified organism: Secondary | ICD-10-CM | POA: Diagnosis not present

## 2023-11-17 LAB — GLUCOSE, CAPILLARY
Glucose-Capillary: 147 mg/dL — ABNORMAL HIGH (ref 70–99)
Glucose-Capillary: 139 mg/dL — ABNORMAL HIGH (ref 70–99)
Glucose-Capillary: 176 mg/dL — ABNORMAL HIGH (ref 70–99)
Glucose-Capillary: 238 mg/dL — ABNORMAL HIGH (ref 70–99)

## 2023-11-17 NOTE — Progress Notes (Signed)
 Occupational Therapy Treatment Patient Details Name: David [name] MRN: [id number] DOB: May 04, 1946 Today's Date: 11/17/2023   History of present illness Pt is a 78 y.o. male admitted 10/29/23 for Acute on Chronic combined systolic/diastolic CHF exacerbation with associate acute hypoxic respiratory failure. Past medical history significant of CAD s/p stent, Combined systolic diastolic heart failure, CKD, GERD, HTN , anemia of chronic illness, DM II, neuropathy   OT comments  Pt seen for OT/PT co-tx. Pt requires MOD A +2 for bed mobility, posterior and R lateral lean present sitting EOB. Able to maintain seated balance with intermittent HHA on bed rail and cues to maintain midline orientation. Grooming tasks completed with setup, tolerates 10-15 mins seated before fatiguing. Discharge recommendation appropriate, OT will continue to progress as able.       If plan is discharge home, recommend the following:  Two people to help with walking and/or transfers;Two people to help with bathing/dressing/bathroom;Assistance with cooking/housework;Assistance with feeding;Direct supervision/assist for medications management;Direct supervision/assist for financial management;Assist for transportation;Help with stairs or ramp for entrance   Equipment Recommendations  None recommended by OT       Precautions / Restrictions Precautions Precautions: Fall Recall of Precautions/Restrictions: Intact Precaution/Restrictions Comments: pt reports 2L O2 [place] baseline as needed, wears left knee sleeve/brace, Prevalon boots for bil heel pressure injuries plus Neuropathy all extremities.  Wearing welders gloves to keep his hands warm. Vision deficits. Restrictions Weight Bearing Restrictions Per Provider Order: No       Mobility Bed Mobility Overal bed mobility: Needs Assistance Bed Mobility: Supine to Sit, Sit to Supine     Supine to sit: +2 for physical assistance, Mod assist Sit to supine: Mod assist, +2  for physical assistance   General bed mobility comments: with encourgement, pt is able to perform bed mobility with MOD A +2, assist for trunk and BLE due to pain and wakenss.    Transfers Overall transfer level: Needs assistance                 General transfer comment: NT, session focused on seated balance and ADLs     Balance Overall balance assessment: Needs assistance Sitting-balance support: Bilateral upper extremity supported, Feet supported Sitting balance-Leahy Scale: Poor Sitting balance - Comments: significant R lateral lean supported and unsupported, improves with time in position until fatigue sets in. can maintain balance with close supervision, up to MAXA for orientation to midline Postural control: Right lateral lean, Posterior lean                                 ADL either performed or assessed with clinical judgement   ADL Overall ADL's : Needs assistance/impaired Eating/Feeding: Bed level Eating/Feeding Details (indicate cue type and reason): requires assist to open container of ensure bed level Grooming: Sitting;Oral care;Set up;Wash/dry face;Wash/dry hands Grooming Details (indicate cue type and reason): requires external support for seated balance while performing oral care sitting EOB. pt with decreased functional grasp, min vcs                               General ADL Comments: requires +2 for ADL setup and seated balance, heavy R and posterior lean     Communication Communication Communication: No apparent difficulties Factors Affecting Communication: Other (comment)   Cognition Arousal: Alert Behavior During Therapy: WFL for tasks assessed/performed Cognition: No apparent impairments  Following commands: Intact        Cueing   Cueing Techniques: Verbal cues        General Comments dons PRAFOs throguhout session    Pertinent Vitals/ Pain        No  pain   Frequency  Min 2X/week        Progress Toward Goals  OT Goals(current goals can now be found in the care plan section)  Progress towards OT goals: Progressing toward goals  Acute Rehab OT Goals OT Goal Formulation: With patient Time For Goal Achievement: 11/23/23 Potential to Achieve Goals: Fair  Plan      Co-evaluation      Reason for Co-Treatment: For patient/therapist safety;Complexity of the patient's impairments (multi-system involvement) PT goals addressed during session: Mobility/safety with mobility;Balance;Strengthening/ROM OT goals addressed during session: Strengthening/ROM;ADL's and self-care      AM-PAC OT 6 Clicks Daily Activity     Outcome Measure   Help from another person eating meals?: A Lot Help from another person taking care of personal grooming?: A Lot Help from another person toileting, which includes using toliet, bedpan, or urinal?: Total Help from another person bathing (including washing, rinsing, drying)?: A Lot Help from another person to put on and taking off regular upper body clothing?: A Lot Help from another person to put on and taking off regular lower body clothing?: Total 6 Click Score: 10    End of Session Equipment Utilized During Treatment: Oxygen  OT Visit Diagnosis: Other abnormalities of gait and mobility (R26.89);Muscle weakness (generalized) (M62.81);Pain   Activity Tolerance Patient limited by fatigue   Patient Left in bed;with call bell/phone within reach;with bed alarm set   Nurse Communication Mobility status        Time: 1326-1350 OT Time Calculation (min): 24 min  Charges: OT General Charges $OT Visit: 1 Visit OT Treatments $Self Care/Home Management : 8-22 mins Audreyanna Butkiewicz L. Anavey Coombes, OTR/L  11/17/23, 3:51 PM

## 2023-11-17 NOTE — TOC Progression Note (Addendum)
 Transition of Care [facility]) - Progression Note    Patient Details  Name: David [name] MRN: [id number] Date of Birth: 07/24/1945  Transition of Care [facility]) CM/SW Contact  Doneta Glenys DASEN, RN Phone Number: 11/17/2023, 9:18 AM  Clinical Narrative:    CM called [facility] to file a case. CM will wait to hear if [facility] will accept patient and wife David [name]. 10:40 AM  [facility] did not accept patient for evaluation. However, the wife was accepted to evaluate. 3:15 PM CM spoke with patient in the room. Informed patient if he wants to return to a SNF that he can appeal the denial or he would need to pay out of pocket. Patient said just sent me home. I will set patient up with [facility] PT/OT/RN/SW/Aid 3:15 [facility] Angie has send papers work to main office. 4:30 [facility] not able to accept care if discharged tomorrow. Will reach out to [facility].  Expected Discharge Plan: Skilled Nursing Facility Barriers to Discharge: SNF Authorization Denied  Expected Discharge Plan and Services In-house Referral: NA Discharge Planning Services: CM Consult Post Acute Care Choice: NA Living arrangements for the past 2 months: Skilled Nursing Facility Expected Discharge Date: 11/11/23               DME Arranged: N/A DME Agency: NA       HH Arranged: NA HH Agency: NA         Social Determinants of Health (SDOH) Interventions SDOH Screenings   Food Insecurity: No Food Insecurity (10/30/2023)  Housing: Low Risk  (10/30/2023)  Transportation Needs: No Transportation Needs (10/30/2023)  Utilities: Not At Risk (10/30/2023)  Alcohol Screen: Low Risk  (05/18/2023)  Financial Resource Strain: Low Risk  (05/18/2023)  Physical Activity: Inactive (08/20/2017)  Social Connections: Socially Integrated (10/30/2023)  Stress: No Stress Concern Present (08/20/2017)  Tobacco Use: Medium Risk (11/07/2023)    Readmission Risk Interventions    11/09/2023   11:30 AM 11/06/2023    8:44 AM  Readmission Risk  Prevention Plan  Transportation Screening Complete Complete  Medication Review (RN Care Manager) Complete Complete  PCP or Specialist appointment within 3-5 days of discharge Complete   HRI or Home Care Consult Complete Complete  SW Recovery Care/Counseling Consult Complete Complete  Palliative Care Screening Not Applicable Not Applicable  Skilled Nursing Facility Complete Not Applicable

## 2023-11-17 NOTE — Progress Notes (Signed)
 PROGRESS NOTE  David [name] FMW:[id number] DOB: 17-Nov-1945 DOA: 10/29/2023 PCP: Rexanne Ingle, MD   LOS: 18 days   Brief Narrative / Interim history: 78 year old male with CAD status post stenting, combined CHF, CKD 4, DM2, HTN who was admitted to the hospital with shortness of breath of several days.  He apparently required nonrebreather upon being transported to the ER.  On admission imaging showed vascular congestion, small right pleural effusion with concern for fluid overload.  He was admitted to the hospital and diuresed.  SNF apparently was denied.  Await plan per TOC for safe discharge  Subjective / 24h Interval events: Under the covers, will wake up when spoken to  Assesement and Plan: Principal problem Acute on chronic combined CHF, acute hypoxic respiratory failure-imaging on admission of the CT of the chest did not show any PE but it did show CHF with pulmonary edema and bilateral pleural effusions.  Has been diuresed and now placed on torsemide , continue.  He is net -5.1 L, now reports feeling much better and close to baseline - Continue Imdur as well  CAP-RSV, Covid, flu all negative.  Status post 5 days of ceftriaxone /azithromycin .  Respiratory status stable  Troponin elevation-flat, not in a pattern consistent with ACS, likely demand ischemia in the setting of CHF  Epigastric discomfort /intermittent anxiety-improved after Reglan .  Can be discharged on Reglan  PRN, Zoloft , Bentyl  as well  Macrocytic anemia -hemoglobin dipped to 6.0 on 6/18, received 1 unit of packed red blood cells.  Of note, he was hospitalized earlier on with symptomatic anemia.  GI consulted and evaluated patient, underwent an EGD which showed nonbleeding gastric ulcer, mucosal changes in the duodenal, and biopsies showed chronic active gastritis with ulcer and purulent exudates, iron  deposits suggestive of iron  pill gastritis, negative for intestinal metaplasia or dysplasia. Duodenal biopsy negative  for dysplasia or malignancy.  -colonoscopy was offered but he declined  Essential hypertension-continue Imdur  Acute kidney injury on CKD stage IV -baseline creatinine between 2 and 3, it was as high as 3.58 on 6/18.  Has improved meanwhile and now back to baseline around 2.4  CAD s/p stent -no chest discomfort   DMII - hemoglobin A1c 6.0.     Chronic diabetic foot ulcer b/l - Per wound care Unstageable Pressure Injury right and left heel; left and right lateral feet'; all are 100% eschar  Arterial ulceration left medial great toe; 100% clean Pressure Injury POA: Yes Cleanse all foot wounds with saline pat dry Paint all foot wounds with betadine, allow to air dry. Ok to cover with foam. Reapply betadine daily.  Offload heels at all times with prevalon boots Single layer of xeroform to the great toe wound, top with dry dressing or bandaid. Change daily Patient was supposed to follow-up with vascular surgery after discharge however he did not follow-up with them. - Treated with doxycycline  for 10 days  ABI normal on the right and moderate left lower extremity arterial disease.    he was due to have an appointment with vascular as an outpatient but he did not follow-up on the appointment. -Need follow-up with vascular surgery as outpatient   Scheduled Meds:  bisacodyl   10 mg Rectal Once   docusate sodium   100 mg Oral Daily   feeding supplement  237 mL Oral BID BM   insulin  aspart  0-6 Units Subcutaneous TID WC   isosorbide -hydrALAZINE   2 tablet Oral TID   latanoprost   1 drop Both Eyes QHS   metoCLOPramide  (REGLAN ) injection  5 mg Intravenous Q6H   pantoprazole   40 mg Oral Daily   sertraline   25 mg Oral Daily   torsemide   20 mg Oral Daily   traZODone   100 mg Oral QHS   Continuous Infusions: PRN Meds:.acetaminophen  **OR** acetaminophen , albuterol , bisacodyl , dicyclomine , guaiFENesin -dextromethorphan, ondansetron  (ZOFRAN ) IV, simethicone , traMADol   Current Outpatient Medications   Medication Instructions   acetaminophen  (TYLENOL ) 1,000 mg, Oral, 2 times daily   albuterol  (PROVENTIL  HFA;VENTOLIN  HFA) 108 (90 Base) MCG/ACT inhaler 2 puffs, Inhalation, Every 6 hours PRN   [Paused] amLODipine  (NORVASC ) 10 mg, Oral, Daily   aspirin  EC 81 mg, Oral, Daily   atorvastatin  (LIPITOR ) 80 mg, Oral, Daily at bedtime   bisacodyl  (DULCOLAX) 10 mg, Rectal, Daily PRN   bismuth subsalicylate (PEPTO BISMOL) 262 MG/15ML suspension 30 mLs, Oral, Daily PRN   cholecalciferol  (VITAMIN D3) 5,000 Units, Oral, Daily   cyanocobalamin (VITAMIN B12) 2,000 mcg, Oral, Daily   dextromethorphan-guaiFENesin  (MUCINEX  DM) 30-600 MG 12hr tablet 1 tablet, Oral, 2 times daily   dicyclomine  (BENTYL ) 10 mg, Oral, 4 times daily   docusate sodium  (COLACE) 100 mg, Oral, 2 times daily   ferrous sulfate  325 mg, Oral, Daily with breakfast   gabapentin  (NEURONTIN ) 200 mg, Oral, 3 times daily   insulin  aspart (NOVOLOG ) 0-6 Units, Subcutaneous, 3 times daily with meals   insulin  glargine-yfgn (SEMGLEE ) 15 Units, Subcutaneous, Daily   isosorbide -hydrALAZINE  (BIDIL ) 20-37.5 MG tablet 2 tablets, Oral, 3 times daily   Lantus  SoloStar 12 Units, Subcutaneous, Daily   latanoprost  (XALATAN ) 0.005 % ophthalmic solution 1 drop, Both Eyes, Daily at bedtime   leptospermum manuka honey (MEDIHONEY) PSTE paste 1 Application, Topical, Daily, Apply to buttocks and left heel as directed   lubiprostone  (AMITIZA ) 24 mcg, Daily   melatonin 3 mg, Oral, Daily at bedtime   methocarbamol  (ROBAXIN ) 500 mg, Oral, Every 6 hours PRN   metoCLOPramide  (REGLAN ) 5 mg, Oral, 3 times daily   [Paused] metoprolol  succinate (TOPROL -XL) 100 mg, Oral, Daily   Multiple Vitamin (MULTIVITAMIN WITH MINERALS) TABS tablet 1 tablet, Oral, Daily   nitroGLYCERIN  (NITROSTAT ) 0.4 mg, Sublingual, Every 5 min PRN   ondansetron  (ZOFRAN ) 4 mg, Oral, Every 6 hours PRN   OXYGEN 3 L/min, Inhalation, Continuous   pantoprazole  (PROTONIX ) 40 mg, Oral, Daily    polyethylene glycol powder (GLYCOLAX /MIRALAX ) 17 GM/SCOOP powder Take 1 capful (17 g) by mouth daily.   pyridoxine  (B-6) 100 mg, Oral, Daily   sertraline  (ZOLOFT ) 25 mg, Daily   torsemide  (DEMADEX ) 20 mg, Oral, Daily   traMADol  (ULTRAM ) 50 mg, Oral, 3 times daily PRN   traZODone  (DESYREL ) 100 mg, Oral, Daily at bedtime   UNABLE TO FIND 30 mLs, Oral, 2 times daily, Med Name: protein liquid     Diet Orders (From admission, onward)     Start     Ordered   11/11/23 0000  Diet - low sodium heart healthy        11/11/23 0841   11/11/23 0000  Diet Carb Modified        11/11/23 0841   11/07/23 1018  DIET SOFT Fluid consistency: Thin  Diet effective now       Question:  Fluid consistency:  Answer:  Thin   11/07/23 1017            DVT prophylaxis:    Lab Results  Component Value Date   PLT 226 11/15/2023      Code Status: Full Code  Family Communication: wife at bedside 6/30  Status is: Inpatient  Remains inpatient appropriate because: severity of illness  Level of care: Med-Surg  Consultants:  GI  Objective: Vitals:   11/16/23 0536 11/16/23 2033 11/17/23 0715 11/17/23 1243  BP: (!) 144/74 110/66  (!) 172/81  Pulse: (!) 50 (!) 58  61  Resp: 16 17    Temp: 97.8 F (36.6 C) 98.1 F (36.7 C)  98.1 F (36.7 C)  TempSrc: Oral   Oral  SpO2: 100% 100%  100%  Weight:  87.2 kg 87.1 kg   Height:        Intake/Output Summary (Last 24 hours) at 11/17/2023 1250 Last data filed at 11/17/2023 0800 Gross per 24 hour  Intake 240 ml  Output 1100 ml  Net -860 ml   Wt Readings from Last 3 Encounters:  11/17/23 87.1 kg  09/09/23 87 kg  05/30/23 94.2 kg    Examination:   General: Appearance:     Overweight male in no acute distress     Lungs:     respirations unlabored  Heart:    Normal heart rate. Normal rhythm. No murmurs, rubs, or gallops.      Neurologic:   sleepy      Data Reviewed: I have independently reviewed following labs and imaging studies    CBC Recent Labs  Lab 11/15/23 0643  WBC 8.8  HGB 8.7*  HCT 28.6*  PLT 226  MCV 108.3*  MCH 33.0  MCHC 30.4  RDW 21.0*    Recent Labs  Lab 11/11/23 0426 11/15/23 0643  NA 133* 136  K 4.1 3.7  CL 97* 99  CO2 27 26  GLUCOSE 119* 185*  BUN 40* 52*  CREATININE 2.50* 2.42*  CALCIUM  8.4* 8.6*  AST  --  24  ALT  --  18  ALKPHOS  --  81  BILITOT  --  0.6  ALBUMIN   --  2.7*    ------------------------------------------------------------------------------------------------------------------ No results for input(s): CHOL, HDL, LDLCALC, TRIG, CHOLHDL, LDLDIRECT in the last 72 hours.  Lab Results  Component Value Date   HGBA1C 6.0 (H) 10/30/2023   ------------------------------------------------------------------------------------------------------------------ No results for input(s): TSH, T4TOTAL, T3FREE, THYROIDAB in the last 72 hours.  Invalid input(s): FREET3  Cardiac Enzymes No results for input(s): CKMB, TROPONINI, MYOGLOBIN in the last 168 hours.  Invalid input(s): CK ------------------------------------------------------------------------------------------------------------------    Component Value Date/Time   BNP 650.9 (H) 10/29/2023 1746    CBG: Recent Labs  Lab 11/16/23 0744 11/16/23 1202 11/16/23 1647 11/16/23 2127 11/17/23 0848  GLUCAP 124* 151* 191* 231* 147*    No results found for this or any previous visit (from the past 240 hours).   Radiology Studies: No results found.   Harlene Bowl DO [facility]  Between 7 am - 7 pm I am available, please contact me via Amion (for emergencies) or Securechat (non urgent messages)  Between 7 pm - 7 am I am not available, please contact night coverage MD/APP via Amion

## 2023-11-17 NOTE — Progress Notes (Signed)
 Physical Therapy Treatment Patient Details Name: David [name] MRN: [id number] DOB: 1945/10/21 Today's Date: 11/17/2023   History of Present Illness Pt is a 78 y.o. male admitted 10/29/23 for Acute on Chronic combined systolic/diastolic CHF exacerbation with associate acute hypoxic respiratory failure. Past medical history significant of CAD s/p stent, Combined systolic diastolic heart failure, CKD, GERD, HTN , anemia of chronic illness, DM II, neuropathy    PT Comments  Pt in bed when PT arrives, agreeable to session. Pt requires assistance for trunk and LE management with transfers supine to and from sitting. Once upright, pt requires inc time but is able to maintain balance with intermittent HHA on rails. Able to brush teeth and wash face, fatigues with 10-15 mins in sitting evident by increase amplitude of R lean and decreased ability to maintain sitting balance. Pt returns to supine and positioned comfortably with all needs met.    If plan is discharge home, recommend the following: Two people to help with walking and/or transfers;A lot of help with bathing/dressing/bathroom;Assistance with cooking/housework;Assist for transportation   Can travel by private vehicle     No  Equipment Recommendations  Hospital bed;Hoyer lift    Recommendations for Other Services       Precautions / Restrictions Precautions Precautions: Fall Recall of Precautions/Restrictions: Intact Precaution/Restrictions Comments: pt reports 2L O2 [place] baseline as needed, wears left knee sleeve/brace, Prevalon boots for bil heel pressure injuries plus Neuropathy all extremities.  Wearing welders gloves to keep his hands warm. Restrictions Weight Bearing Restrictions Per Provider Order: No     Mobility  Bed Mobility Overal bed mobility: Needs Assistance Bed Mobility: Supine to Sit, Sit to Supine     Supine to sit: +2 for physical assistance, Mod assist Sit to supine: Mod assist, +2 for physical assistance    General bed mobility comments: requires assistance for trunk and LE management with transfers supine to and from sitting. Once upright, pt requires inc time but is able to maintain balance with intermittent HHA on rails. Able to brush teeth and wash face, fatigues with 10-15 mins in sitting evident by increase amplitude of R lean and decreased ability to maintain sitting balance    Transfers                        Ambulation/Gait                   Stairs             Wheelchair Mobility     Tilt Bed    Modified Rankin (Stroke Patients Only)       Balance Overall balance assessment: Needs assistance Sitting-balance support: Bilateral upper extremity supported, Feet supported Sitting balance-Leahy Scale: Poor Sitting balance - Comments: significant R lateral lean supported and unsupported, improves with time in position until fatigue sets in Postural control: Right lateral lean, Posterior lean                                  Communication Communication Communication: Impaired;No apparent difficulties Factors Affecting Communication: Other (comment) (blind R eye)  Cognition Arousal: Alert Behavior During Therapy: WFL for tasks assessed/performed   PT - Cognitive impairments: No apparent impairments                         Following commands: Intact      Cueing Cueing  Techniques: Verbal cues  Exercises      General Comments General comments (skin integrity, edema, etc.): dons PRAFOs throguhout session      Pertinent Vitals/Pain Pain Assessment Pain Assessment: No/denies pain Pain Intervention(s): Monitored during session, Repositioned, Limited activity within patient's tolerance    Home Living                          Prior Function            PT Goals (current goals can now be found in the care plan section) Acute Rehab PT Goals PT Goal Formulation: With patient/family Time For Goal Achievement:  11/22/23 Potential to Achieve Goals: Fair Progress towards PT goals: Progressing toward goals    Frequency    Min 2X/week      PT Plan      Co-evaluation PT/OT/SLP Co-Evaluation/Treatment: Yes Reason for Co-Treatment: For patient/therapist safety;Complexity of the patient's impairments (multi-system involvement) PT goals addressed during session: Mobility/safety with mobility;Balance;Strengthening/ROM OT goals addressed during session: Strengthening/ROM;ADL's and self-care      AM-PAC PT 6 Clicks Mobility   Outcome Measure  Help needed turning from your back to your side while in a flat bed without using bedrails?: A Lot Help needed moving from lying on your back to sitting on the side of a flat bed without using bedrails?: Total Help needed moving to and from a bed to a chair (including a wheelchair)?: Total Help needed standing up from a chair using your arms (e.g., wheelchair or bedside chair)?: Total Help needed to walk in hospital room?: Total Help needed climbing 3-5 steps with a railing? : Total 6 Click Score: 7    End of Session Equipment Utilized During Treatment: Gait belt Activity Tolerance: Patient tolerated treatment well Patient left: with call bell/phone within reach;in bed Nurse Communication: Mobility status PT Visit Diagnosis: Muscle weakness (generalized) (M62.81);Other abnormalities of gait and mobility (R26.89)     Time: 1326-1350 PT Time Calculation (min) (ACUTE ONLY): 24 min  Charges:    $Therapeutic Activity: 8-22 mins PT General Charges $$ ACUTE PT VISIT: 1 Visit                     Stann, PT Acute Rehabilitation Services Office: [phone] 11/17/2023    [name] 11/17/2023, 2:04 PM

## 2023-11-17 NOTE — Plan of Care (Signed)
  Problem: Education: Goal: Ability to describe self-care measures that may prevent or decrease complications (Diabetes Survival Skills Education) will improve Outcome: Progressing Goal: Individualized Educational Video(s) Outcome: Progressing   Problem: Coping: Goal: Ability to adjust to condition or change in health will improve Outcome: Progressing   Problem: Fluid Volume: Goal: Ability to maintain a balanced intake and output will improve Outcome: Progressing   Problem: Health Behavior/Discharge Planning: Goal: Ability to identify and utilize available resources and services will improve Outcome: Progressing Goal: Ability to manage health-related needs will improve Outcome: Progressing   Problem: Metabolic: Goal: Ability to maintain appropriate glucose levels will improve Outcome: Progressing   Problem: Nutritional: Goal: Maintenance of adequate nutrition will improve Outcome: Progressing Goal: Progress toward achieving an optimal weight will improve Outcome: Progressing   Problem: Skin Integrity: Goal: Risk for impaired skin integrity will decrease Outcome: Progressing   Problem: Tissue Perfusion: Goal: Adequacy of tissue perfusion will improve Outcome: Progressing   Problem: Education: Goal: Ability to demonstrate management of disease process will improve Outcome: Progressing Goal: Ability to verbalize understanding of medication therapies will improve Outcome: Progressing Goal: Individualized Educational Video(s) Outcome: Progressing   Problem: Activity: Goal: Capacity to carry out activities will improve Outcome: Progressing   Problem: Cardiac: Goal: Ability to achieve and maintain adequate cardiopulmonary perfusion will improve Outcome: Progressing   Problem: Activity: Goal: Ability to tolerate increased activity will improve Outcome: Progressing   Problem: Clinical Measurements: Goal: Ability to maintain a body temperature in the normal range will  improve Outcome: Progressing   Problem: Respiratory: Goal: Ability to maintain adequate ventilation will improve Outcome: Progressing Goal: Ability to maintain a clear airway will improve Outcome: Progressing   Problem: Education: Goal: Knowledge of General Education information will improve Description: Including pain rating scale, medication(s)/side effects and non-pharmacologic comfort measures Outcome: Progressing   Problem: Health Behavior/Discharge Planning: Goal: Ability to manage health-related needs will improve Outcome: Progressing   Problem: Clinical Measurements: Goal: Ability to maintain clinical measurements within normal limits will improve Outcome: Progressing Goal: Will remain free from infection Outcome: Progressing Goal: Diagnostic test results will improve Outcome: Progressing Goal: Respiratory complications will improve Outcome: Progressing Goal: Cardiovascular complication will be avoided Outcome: Progressing   Problem: Activity: Goal: Risk for activity intolerance will decrease Outcome: Progressing   Problem: Nutrition: Goal: Adequate nutrition will be maintained Outcome: Progressing   Problem: Coping: Goal: Level of anxiety will decrease Outcome: Progressing   Problem: Elimination: Goal: Will not experience complications related to bowel motility Outcome: Progressing Goal: Will not experience complications related to urinary retention Outcome: Progressing   Problem: Pain Managment: Goal: General experience of comfort will improve and/or be controlled Outcome: Progressing   Problem: Safety: Goal: Ability to remain free from injury will improve Outcome: Progressing   Problem: Skin Integrity: Goal: Risk for impaired skin integrity will decrease Outcome: Progressing   Problem: Skin Integrity: Goal: Risk for impaired skin integrity will decrease Outcome: Progressing

## 2023-11-18 DIAGNOSIS — I5033 Acute on chronic diastolic (congestive) heart failure: Secondary | ICD-10-CM

## 2023-11-18 DIAGNOSIS — R7989 Other specified abnormal findings of blood chemistry: Secondary | ICD-10-CM | POA: Diagnosis present

## 2023-11-18 LAB — GLUCOSE, CAPILLARY
Glucose-Capillary: 137 mg/dL — ABNORMAL HIGH (ref 70–99)
Glucose-Capillary: 171 mg/dL — ABNORMAL HIGH (ref 70–99)
Glucose-Capillary: 157 mg/dL — ABNORMAL HIGH (ref 70–99)
Glucose-Capillary: 212 mg/dL — ABNORMAL HIGH (ref 70–99)

## 2023-11-18 NOTE — Plan of Care (Signed)
  Problem: Education: Goal: Ability to describe self-care measures that may prevent or decrease complications (Diabetes Survival Skills Education) will improve Outcome: Progressing Goal: Individualized Educational Video(s) Outcome: Progressing   Problem: Coping: Goal: Ability to adjust to condition or change in health will improve Outcome: Progressing   Problem: Fluid Volume: Goal: Ability to maintain a balanced intake and output will improve Outcome: Progressing   Problem: Health Behavior/Discharge Planning: Goal: Ability to identify and utilize available resources and services will improve Outcome: Progressing Goal: Ability to manage health-related needs will improve Outcome: Progressing   Problem: Metabolic: Goal: Ability to maintain appropriate glucose levels will improve Outcome: Progressing   Problem: Nutritional: Goal: Maintenance of adequate nutrition will improve Outcome: Progressing Goal: Progress toward achieving an optimal weight will improve Outcome: Progressing   Problem: Skin Integrity: Goal: Risk for impaired skin integrity will decrease Outcome: Progressing   Problem: Tissue Perfusion: Goal: Adequacy of tissue perfusion will improve Outcome: Progressing   Problem: Education: Goal: Ability to demonstrate management of disease process will improve Outcome: Progressing Goal: Ability to verbalize understanding of medication therapies will improve Outcome: Progressing Goal: Individualized Educational Video(s) Outcome: Progressing   Problem: Activity: Goal: Capacity to carry out activities will improve Outcome: Progressing   Problem: Cardiac: Goal: Ability to achieve and maintain adequate cardiopulmonary perfusion will improve Outcome: Progressing   Problem: Activity: Goal: Ability to tolerate increased activity will improve Outcome: Progressing   Problem: Clinical Measurements: Goal: Ability to maintain a body temperature in the normal range will  improve Outcome: Progressing   Problem: Respiratory: Goal: Ability to maintain adequate ventilation will improve Outcome: Progressing Goal: Ability to maintain a clear airway will improve Outcome: Progressing   Problem: Education: Goal: Knowledge of General Education information will improve Description: Including pain rating scale, medication(s)/side effects and non-pharmacologic comfort measures Outcome: Progressing   Problem: Health Behavior/Discharge Planning: Goal: Ability to manage health-related needs will improve Outcome: Progressing   Problem: Clinical Measurements: Goal: Ability to maintain clinical measurements within normal limits will improve Outcome: Progressing Goal: Will remain free from infection Outcome: Progressing Goal: Diagnostic test results will improve Outcome: Progressing Goal: Respiratory complications will improve Outcome: Progressing Goal: Cardiovascular complication will be avoided Outcome: Progressing   Problem: Activity: Goal: Risk for activity intolerance will decrease Outcome: Progressing   Problem: Nutrition: Goal: Adequate nutrition will be maintained Outcome: Progressing   Problem: Coping: Goal: Level of anxiety will decrease Outcome: Progressing   Problem: Elimination: Goal: Will not experience complications related to bowel motility Outcome: Progressing Goal: Will not experience complications related to urinary retention Outcome: Progressing   Problem: Pain Managment: Goal: General experience of comfort will improve and/or be controlled Outcome: Progressing   Problem: Safety: Goal: Ability to remain free from injury will improve Outcome: Progressing   Problem: Skin Integrity: Goal: Risk for impaired skin integrity will decrease Outcome: Progressing   Problem: Skin Integrity: Goal: Risk for impaired skin integrity will decrease Outcome: Progressing

## 2023-11-18 NOTE — Hospital Course (Addendum)
 78yo with h/o CAD s/p stent, chronic combined CHF, stage 4 CKD, DM, and HTN who presented on 6/12 with SOB.  He was treated for acute on chronic CHF and effectively diuresed.  He is blood pressure was initially low and heart rate was low as well.  His amlodipine  and metoprolol  has been held.  They could be restarted once blood pressure and heart rate is stable.   Disposition has been complicated, but he was finally recognized as having LTC Medicaid and so should be able to dc to a facility soon.  He is medically stable. His disposition was delayed due to lack of SNF insurance.  Now he has a LTC [employer] and he is being planned for discharge to [facility] today. Patient has been on oxygen.

## 2023-11-18 NOTE — Progress Notes (Signed)
 Physical Therapy Treatment Patient Details Name: David [name] MRN: [id number] DOB: 20-Feb-1946 Today's Date: 11/18/2023   History of Present Illness Pt is a 78 y.o. male admitted 10/29/23 for Acute on Chronic combined systolic/diastolic CHF exacerbation with associate acute hypoxic respiratory failure. Past medical history significant of CAD s/p stent, Combined systolic diastolic heart failure, CKD, GERD, HTN , anemia of chronic illness, DM II, neuropathy    PT Comments  The patient is motivated to sit up onto bed edge. Requires extensive assistance of 2 persons. Patient has been limited in mobility for several months.  Patient leans to right in sitting. Patient does initiate moving back near midline, then drifts back to the right. Patient sat on bed edge x 8 . Continue PT for mobility. Patient will benefit from continued inpatient follow up therapy, <3 hours/day     If plan is discharge home, recommend the following: Two people to help with walking and/or transfers;A lot of help with bathing/dressing/bathroom;Assistance with cooking/housework;Assist for transportation   Can travel by private vehicle     No  Equipment Recommendations  Hospital bed;Hoyer lift    Recommendations for Other Services       Precautions / Restrictions Precautions Precautions: Fall Recall of Precautions/Restrictions: Intact Precaution/Restrictions Comments: pt reports 2L O2 [place] baseline as needed, wears left knee sleeve/brace, Prevalon boots for bil heel pressure injuries plus Neuropathy all extremities.  Wearing welders gloves to keep his hands warm. Vision deficits.     Mobility  Bed Mobility   Bed Mobility: Rolling Rolling: Mod assist, Used rails   Supine to sit: +2 for physical assistance, Mod assist, Max assist, +2 for safety/equipment, HOB elevated Sit to supine: Max assist, +2 for safety/equipment, +2 for physical assistance   General bed mobility comments: PURPLE SLIDE UNDER BED PAD ,  ASSIST LEGS TO BED EDGE AND TO INITIATE COMING TO SIT, PATIENT  ROLLED TO LEFT ELBOW AND ASSISTED  TO PUSH UPRIGHT. MAX TO RETURN TO SUPINE , LEGS AND TRUN ASSIST.    Transfers                   General transfer comment: nt HAS NOT STOOD IN MONTHS    Ambulation/Gait                   Stairs             Wheelchair Mobility     Tilt Bed    Modified Rankin (Stroke Patients Only)       Balance Overall balance assessment: Needs assistance Sitting-balance support: Bilateral upper extremity supported, Feet supported Sitting balance-Leahy Scale: Poor Sitting balance - Comments: significant R lateral lean supported and unsupported, improves with time in position until fatigue sets in. can maintain balance with close supervision, up to MAX A for orientation to midline Postural control: Right lateral lean, Posterior lean                                  Communication    Cognition Arousal: Alert Behavior During Therapy: WFL for tasks assessed/performed   PT - Cognitive impairments: No apparent impairments                       PT - Cognition Comments: PATIENT AWAKE AND  ORIENTED, READY  TO SIT UP. Following commands: Intact      Cueing Cueing Techniques: Verbal cues  Exercises General Exercises -  Lower Extremity Long Arc Quad: AROM, Both, 10 reps    General Comments        Pertinent Vitals/Pain Pain Assessment Faces Pain Scale: Hurts even more Pain Location: L knee with  ROM Pain Descriptors / Indicators: Grimacing, Guarding, Sore Pain Intervention(s): Monitored during session, Repositioned    Home Living                          Prior Function            PT Goals (current goals can now be found in the care plan section) Progress towards PT goals: Progressing toward goals    Frequency    Min 2X/week      PT Plan      Co-evaluation              AM-PAC PT 6 Clicks Mobility   Outcome  Measure  Help needed turning from your back to your side while in a flat bed without using bedrails?: A Lot Help needed moving from lying on your back to sitting on the side of a flat bed without using bedrails?: Total Help needed moving to and from a bed to a chair (including a wheelchair)?: Total Help needed standing up from a chair using your arms (e.g., wheelchair or bedside chair)?: Total Help needed to walk in hospital room?: Total Help needed climbing 3-5 steps with a railing? : Total 6 Click Score: 7    End of Session Equipment Utilized During Treatment: Oxygen Activity Tolerance: Patient tolerated treatment well Patient left: with call bell/phone within reach;in bed;with bed alarm set Nurse Communication: Mobility status PT Visit Diagnosis: Muscle weakness (generalized) (M62.81);Other abnormalities of gait and mobility (R26.89)     Time: 8473-8443 PT Time Calculation (min) (ACUTE ONLY): 30 min  Charges:    $Therapeutic Activity: 23-37 mins PT General Charges $$ ACUTE PT VISIT: 1 Visit                     Darice Potters PT Acute Rehabilitation Services Office [phone]    Potters Darice Norris 11/18/2023, 4:28 PM

## 2023-11-18 NOTE — TOC Progression Note (Addendum)
 Transition of Care [facility]) - Progression Note    Patient Details  Name: David [name] MRN: [id number] Date of Birth: 1946/03/30  [facility] CM/SW Contact  Doneta Glenys DASEN, RN Phone Number: 11/18/2023, 9:11 AM  Clinical Narrative:    [facility] unable to accept patient due to capacity. [facility] could not accept due to insurance. Waiting on [facility] to reply. 12:00 [facility] has accepted the patient. 4:00 PM CM has called patients wife with no contact. Patient states he has not heard from his wife in 2 days and is concern. 4:30 PM CM spoke with the daughter yesterday evening. I explained that her father was refusing to call SS to start Medicaid also pay out of pocket for SNF. I explained that I have set him up for all resources we can give him. The patient has refused a hospital and hoyer life. 5:00 PM CM called patients cousin [name] Lunger [phone] to update on plan of care and  concerns from patients wife. [name] stated that will go to the house to check on wife and update CM in the morning.  Expected Discharge Plan: Skilled Nursing Facility Barriers to Discharge: SNF Authorization Denied  Expected Discharge Plan and Services In-house Referral: NA Discharge Planning Services: CM Consult Post Acute Care Choice: NA Living arrangements for the past 2 months: Skilled Nursing Facility Expected Discharge Date: 11/11/23               DME Arranged: N/A DME Agency: NA       HH Arranged: NA HH Agency: NA         Social Determinants of Health (SDOH) Interventions SDOH Screenings   Food Insecurity: No Food Insecurity (10/30/2023)  Housing: Low Risk  (10/30/2023)  Transportation Needs: No Transportation Needs (10/30/2023)  Utilities: Not At Risk (10/30/2023)  Alcohol Screen: Low Risk  (05/18/2023)  Financial Resource Strain: Low Risk  (05/18/2023)  Physical Activity: Inactive (08/20/2017)  Social Connections: Socially Integrated (10/30/2023)  Stress: No Stress  Concern Present (08/20/2017)  Tobacco Use: Medium Risk (11/07/2023)    Readmission Risk Interventions    11/09/2023   11:30 AM 11/06/2023    8:44 AM  Readmission Risk Prevention Plan  Transportation Screening Complete Complete  Medication Review (RN Care Manager) Complete Complete  PCP or Specialist appointment within 3-5 days of discharge Complete   HRI or Home Care Consult Complete Complete  SW Recovery Care/Counseling Consult Complete Complete  Palliative Care Screening Not Applicable Not Applicable  Skilled Nursing Facility Complete Not Applicable

## 2023-11-18 NOTE — Progress Notes (Addendum)
 Progress Note   Patient: David [name] FMW:[id number] DOB: [birth year] DOA: 10/29/2023     19 DOS: the patient was seen and examined on 11/18/2023   Brief hospital course: 78yo with h/o CAD s/p stent, chronic combined CHF, stage 4 CKD, DM, and HTN who presented on 6/12 with SOB.  He was treated for acute on chronic CHF and effectively diuresed.  Disposition has been complicated, as he was recommended for SNF rehab but denied by insurance despite Peer-to-Peer.  He will be discharged to home with [facility] assistance.  Assessment and Plan:  Acute on chronic combined CHF, acute hypoxic respiratory failure Imaging on admission of the CT of the chest did not show any PE but it did show CHF with pulmonary edema and bilateral pleural effusions Has been diuresed and now placed on torsemide  with improvement   He is net -5.1 L, now reports feeling much better and close to baseline Continue Imdur   CAP Possible pneumonia overlay RSV, Covid, flu all negative Status post 5 days of ceftriaxone /azithromycin  Respiratory status stable   Troponin elevation Flat, not in a pattern consistent with ACS Likely demand ischemia in the setting of CHF   Epigastric discomfort /intermittent anxiety Improved after Reglan  Can be discharged on Reglan  PRN, Zoloft , Bentyl  as well   Macrocytic anemia  Hemoglobin dipped to 6.0 on 6/18, received 1 unit of packed red blood cells GI consulted and performed EGD which showed nonbleeding gastric ulcer, mucosal changes in the duodenal, and biopsies showed chronic active gastritis with ulcer and purulent exudates, iron  deposits suggestive of iron  pill gastritis Colonoscopy was offered but he declined   Essential hypertension Continue Imdur   Acute kidney injury on CKD stage IV  Baseline creatinine between 2 and 3, it was as high as 3.58 on 6/18 Improved back to baseline  Avoid nephrotoxic medications   CAD s/p stent  No chest discomfort   DMII  Hemoglobin A1c 6.0, good  control Will cover with very sensitive scale SSI   Chronic diabetic foot ulcer b/l  Per wound care Unstageable Pressure Injury right and left heel; left and right lateral feet'; all are 100% eschar  Arterial ulceration left medial great toe; 100% clean Pressure Injury POA: Yes Cleanse all foot wounds with saline pat dry Paint all foot wounds with betadine, allow to air dry. Ok to cover with foam. Reapply betadine daily.  Offload heels at all times with prevalon boots Single layer of xeroform to the great toe wound, top with dry dressing or bandaid. Change daily Patient was supposed to follow-up with vascular surgery but did not after last hospitalization Treated with doxycycline  for 10 days ABI normal on the right and moderate left lower extremity arterial disease.   Will need follow-up with vascular surgery as outpatient  Disposition Patient has been medically stable for dc for some time He was declined by insurance for SNF rehab since he has been a LTC SNF patient He owes money to [facility] and cannot return there If unable to afford LTC, he will need to go home with home health support Per [facility] team notes, the plan will be home with [facility] with hospital bed and [facility] lift         Consultants: GI PT OT TOC team   Procedures: Echocardiogram 6/16 ABI 6/16 EGD 6/21   Antibiotics: Azithromycin  x 1 Ceftriaxone  x 6 doses        Subjective: He reports that he needs to remain hospitalized to get stronger.  No specific complaints other than being hot and then cold.   I called to speak with his daughter, Lelynd Poer.  She reports that he was recommended for rehab, but insurance will no longer cover this.  He had some PNA, CHF, DM.  Regarding his release, they have determined that he needs to go home.  She is concerned about him going home - wants him to be safe, healthy.  He frequent has to return to the hospital after discharge.  She is in [place] .  He lives  with his wife, [name].  She did not know that she needs to be more involved.       Objective: Vitals:   11/17/23 1935 11/18/23 0438  BP: (!) 182/91 (!) 118/54  Pulse: 72 (!) 52  Resp: 18 16  Temp: 98.4 F (36.9 C) 98.7 F (37.1 C)  SpO2: 100% 100%    Intake/Output Summary (Last 24 hours) at 11/18/2023 1741 Last data filed at 11/18/2023 9561 Gross per 24 hour  Intake --  Output 500 ml  Net -500 ml   Filed Weights   11/16/23 2033 11/17/23 0715 11/18/23 1043  Weight: 87.2 kg 87.1 kg 82.4 kg    Exam:  General:  Appears calm and comfortable and is in NAD, chronically very debilitated Eyes:  R ptosis (chronic) ENT:  grossly normal hearing, lips & tongue, mmm Cardiovascular:  RRR, no m/r/g. No LE edema.  Respiratory:   CTA bilaterally with no wheezes/rales/rhonchi.  Normal respiratory effort. Abdomen:  soft, NT, ND Skin:  no rash or induration seen on limited exam Musculoskeletal:  generalized weakness with developing contractures; BLE are in Prevalon boots Psychiatric:  blunted mood and affect, speech fluent and appropriate, AOx3 Neurologic:  R facial droop, generalized weakness  Data Reviewed: I have reviewed the patient's lab results since admission.  Pertinent labs for today include:   None today     Family Communication: None present; I spoke at length with his daughter by telephone.  I left a message for his wife by telephone.  Disposition: Status is: Inpatient Remains inpatient appropriate because: uncertain disposition     Time spent: 50 minutes  Unresulted Labs (From admission, onward)     Start     Ordered   11/19/23 0500  CBC with Differential/Platelet  Tomorrow morning,   R       Question:  Specimen collection method  Answer:  Lab=Lab collect   11/18/23 1741   11/19/23 0500  Basic metabolic panel with GFR  Tomorrow morning,   R       Question:  Specimen collection method  Answer:  Lab=Lab collect   11/18/23 1741   Unscheduled  Occult blood card to  lab, stool  As needed,   R      11/04/23 1040             Author: Delon Herald, MD 11/18/2023 5:41 PM  For on call review [email or web address].

## 2023-11-19 DIAGNOSIS — I5043 Acute on chronic combined systolic (congestive) and diastolic (congestive) heart failure: Secondary | ICD-10-CM | POA: Diagnosis not present

## 2023-11-19 LAB — CBC WITH DIFFERENTIAL/PLATELET
Abs Immature Granulocytes: 0.02 10*3/uL (ref 0.00–0.07)
Basophils Absolute: 0 10*3/uL (ref 0.0–0.1)
Basophils Relative: 0 %
Eosinophils Absolute: 0.1 10*3/uL (ref 0.0–0.5)
Eosinophils Relative: 1 %
HCT: 27.3 % — ABNORMAL LOW (ref 39.0–52.0)
Hemoglobin: 8.4 g/dL — ABNORMAL LOW (ref 13.0–17.0)
Immature Granulocytes: 0 %
Lymphocytes Relative: 16 %
Lymphs Abs: 1.1 10*3/uL (ref 0.7–4.0)
MCH: 32.6 pg (ref 26.0–34.0)
MCHC: 30.8 g/dL (ref 30.0–36.0)
MCV: 105.8 fL — ABNORMAL HIGH (ref 80.0–100.0)
Monocytes Absolute: 0.9 10*3/uL (ref 0.1–1.0)
Monocytes Relative: 13 %
Neutro Abs: 4.8 10*3/uL (ref 1.7–7.7)
Neutrophils Relative %: 70 %
Platelets: 205 10*3/uL (ref 150–400)
RBC: 2.58 MIL/uL — ABNORMAL LOW (ref 4.22–5.81)
RDW: 20.4 % — ABNORMAL HIGH (ref 11.5–15.5)
WBC: 6.9 10*3/uL (ref 4.0–10.5)
nRBC: 0 % (ref 0.0–0.2)

## 2023-11-19 LAB — GLUCOSE, CAPILLARY
Glucose-Capillary: 130 mg/dL — ABNORMAL HIGH (ref 70–99)
Glucose-Capillary: 252 mg/dL — ABNORMAL HIGH (ref 70–99)
Glucose-Capillary: 146 mg/dL — ABNORMAL HIGH (ref 70–99)
Glucose-Capillary: 160 mg/dL — ABNORMAL HIGH (ref 70–99)

## 2023-11-19 LAB — BASIC METABOLIC PANEL WITH GFR
Anion gap: 9 (ref 5–15)
BUN: 62 mg/dL — ABNORMAL HIGH (ref 8–23)
CO2: 31 mmol/L (ref 22–32)
Calcium: 8.6 mg/dL — ABNORMAL LOW (ref 8.9–10.3)
Chloride: 98 mmol/L (ref 98–111)
Creatinine, Ser: 2.54 mg/dL — ABNORMAL HIGH (ref 0.61–1.24)
GFR, Estimated: 25 mL/min — ABNORMAL LOW (ref >60)
Glucose, Bld: 135 mg/dL — ABNORMAL HIGH (ref 70–99)
Potassium: 4.1 mmol/L (ref 3.5–5.1)
Sodium: 138 mmol/L (ref 135–145)

## 2023-11-19 MED ORDER — SALINE SPRAY 0.65 % NA SOLN
1.0000 | NASAL | Status: DC | PRN
  Filled 2023-11-19: qty 44

## 2023-11-19 NOTE — Plan of Care (Signed)
   Problem: Education: Goal: Ability to describe self-care measures that may prevent or decrease complications (Diabetes Survival Skills Education) will improve Outcome: Progressing Goal: Individualized Educational Video(s) Outcome: Progressing   Problem: Coping: Goal: Ability to adjust to condition or change in health will improve Outcome: Progressing

## 2023-11-19 NOTE — Progress Notes (Signed)
 OT Cancellation Note  Patient Details Name: DAWON TROOP MRN: [id number] DOB: 1946-02-01   Cancelled Treatment:    Reason Eval/Treat Not Completed: Patient's level of consciousness Attempted to see pt for OT treatment this afternoon. Pt currently sleeping soundly and unable to participate in skilled OT treatment at this time. Will continue to follow up with pt at a later time.   Leita Howell, OTR/L,CBIS  Supplemental OT - [facility] and [facility] Secure Chat Preferred   11/19/2023, 4:00 PM

## 2023-11-19 NOTE — Plan of Care (Signed)
  Problem: Education: Goal: Ability to describe self-care measures that may prevent or decrease complications (Diabetes Survival Skills Education) will improve Outcome: Progressing Goal: Individualized Educational Video(s) Outcome: Progressing   Problem: Coping: Goal: Ability to adjust to condition or change in health will improve Outcome: Progressing   Problem: Fluid Volume: Goal: Ability to maintain a balanced intake and output will improve Outcome: Progressing   Problem: Health Behavior/Discharge Planning: Goal: Ability to identify and utilize available resources and services will improve Outcome: Progressing Goal: Ability to manage health-related needs will improve Outcome: Progressing   Problem: Metabolic: Goal: Ability to maintain appropriate glucose levels will improve Outcome: Progressing   Problem: Nutritional: Goal: Maintenance of adequate nutrition will improve Outcome: Progressing Goal: Progress toward achieving an optimal weight will improve Outcome: Progressing   Problem: Skin Integrity: Goal: Risk for impaired skin integrity will decrease Outcome: Progressing   Problem: Tissue Perfusion: Goal: Adequacy of tissue perfusion will improve Outcome: Progressing   Problem: Education: Goal: Ability to demonstrate management of disease process will improve Outcome: Progressing Goal: Ability to verbalize understanding of medication therapies will improve Outcome: Progressing Goal: Individualized Educational Video(s) Outcome: Progressing   Problem: Activity: Goal: Capacity to carry out activities will improve Outcome: Progressing   Problem: Cardiac: Goal: Ability to achieve and maintain adequate cardiopulmonary perfusion will improve Outcome: Progressing   Problem: Activity: Goal: Ability to tolerate increased activity will improve Outcome: Progressing   Problem: Clinical Measurements: Goal: Ability to maintain a body temperature in the normal range will  improve Outcome: Progressing   Problem: Respiratory: Goal: Ability to maintain adequate ventilation will improve Outcome: Progressing Goal: Ability to maintain a clear airway will improve Outcome: Progressing   Problem: Education: Goal: Knowledge of General Education information will improve Description: Including pain rating scale, medication(s)/side effects and non-pharmacologic comfort measures Outcome: Progressing   Problem: Health Behavior/Discharge Planning: Goal: Ability to manage health-related needs will improve Outcome: Progressing   Problem: Clinical Measurements: Goal: Ability to maintain clinical measurements within normal limits will improve Outcome: Progressing Goal: Will remain free from infection Outcome: Progressing Goal: Diagnostic test results will improve Outcome: Progressing Goal: Respiratory complications will improve Outcome: Progressing Goal: Cardiovascular complication will be avoided Outcome: Progressing   Problem: Activity: Goal: Risk for activity intolerance will decrease Outcome: Progressing   Problem: Nutrition: Goal: Adequate nutrition will be maintained Outcome: Progressing   Problem: Coping: Goal: Level of anxiety will decrease Outcome: Progressing   Problem: Elimination: Goal: Will not experience complications related to bowel motility Outcome: Progressing Goal: Will not experience complications related to urinary retention Outcome: Progressing   Problem: Pain Managment: Goal: General experience of comfort will improve and/or be controlled Outcome: Progressing   Problem: Safety: Goal: Ability to remain free from injury will improve Outcome: Progressing   Problem: Skin Integrity: Goal: Risk for impaired skin integrity will decrease Outcome: Progressing   Problem: Skin Integrity: Goal: Risk for impaired skin integrity will decrease Outcome: Progressing

## 2023-11-19 NOTE — Progress Notes (Signed)
 Secure chat sent to primary nurse and Dr Barbarann to update them on pharmacy availability on 7/4 & 7/5 if patient discharges home w/ [facility].

## 2023-11-19 NOTE — TOC Progression Note (Addendum)
 Transition of Care [facility]) - Progression Note    Patient Details  Name: David [name] MRN: [id number] Date of Birth: 28-Aug-1945  [facility] of [facility] CM/SW Contact  Doneta Glenys DASEN, RN Phone Number: 11/19/2023, 8:34 AM  Clinical Narrative:    CM called patients wife. No answer, left message. CM called patient's cousin [name], unable to left message.Her mail box was full. CM sent text message to call by 9:00 AM. If CM does not hear her cousin by 9:00 AM, will call [facility] Police to perform a welfare check. 8:45 AM CM spoke with [facility] - said that they will be able to make a visit on July 4th if patient is discharge today. 9:15 AM CM called [employer] to request a well fare check at a patients address for wife Williams. CM spoke with officer # [phone]. 10:15 Officer Claudene [phone] states patient did not answer door and that one of the vehicle is not in the driveway. Patients wife is located at the [facility] ED. APS will be checking on the wife. 3:00 PM CM and Dr. Barbarann spoke with patient and wife in room. Patient and wife agreeable to having a hospital and hoyer lift delivered to the home and financial assistant to contact patient. [employer] states patient is active with oxygen.   Expected Discharge Plan: Skilled Nursing Facility Barriers to Discharge: SNF Authorization Denied  Expected Discharge Plan and Services In-house Referral: NA Discharge Planning Services: CM Consult Post Acute Care Choice: NA Living arrangements for the past 2 months: Skilled Nursing Facility Expected Discharge Date: 11/11/23               DME Arranged: N/A DME Agency: NA       HH Arranged: NA HH Agency: NA         Social Determinants of Health (SDOH) Interventions SDOH Screenings   Food Insecurity: No Food Insecurity (10/30/2023)  Housing: Low Risk  (10/30/2023)  Transportation Needs: No Transportation Needs (10/30/2023)  Utilities: Not At Risk (10/30/2023)  Alcohol Screen: Low Risk   (05/18/2023)  Financial Resource Strain: Low Risk  (05/18/2023)  Physical Activity: Inactive (08/20/2017)  Social Connections: Socially Integrated (10/30/2023)  Stress: No Stress Concern Present (08/20/2017)  Tobacco Use: Medium Risk (11/07/2023)    Readmission Risk Interventions    11/09/2023   11:30 AM 11/06/2023    8:44 AM  Readmission Risk Prevention Plan  Transportation Screening Complete Complete  Medication Review (RN Care Manager) Complete Complete  PCP or Specialist appointment within 3-5 days of discharge Complete   HRI or Home Care Consult Complete Complete  SW Recovery Care/Counseling Consult Complete Complete  Palliative Care Screening Not Applicable Not Applicable  Skilled Nursing Facility Complete Not Applicable

## 2023-11-19 NOTE — Discharge Summary (Signed)
 Physician Discharge Summary   Patient: David [name] MRN: [id number] DOB: 03-12-1946  Admit date:     10/29/2023  Discharge date: 11/19/23  Discharge Physician: Delon Herald   PCP: Rexanne Ingle, MD   Recommendations at discharge:   You have no more available rehabilitation days in SNF and so would have to pay out of pocket You are encouraged to pursue Medicaid so that long-term care can be an option In the meantime, you are being discharged with maximal home support - hospital bed, [facility] lift, physical/occupational therapy, SW, RN, and aide Home health has been arranged to see you tomorrow, 7/4, by [facility] O2 is also arranged with humidification Hold amlodipine  and Toprol  XL Follow up with Dr. Micki in 1-2 weeks Follow up with vascular surgery; referral placed  Discharge Diagnoses: Principal Problem:   Acute on chronic combined systolic and diastolic CHF (congestive heart failure) (HCC) Active Problems:   Essential hypertension   Type 2 diabetes mellitus with hyperlipidemia (HCC)   Pneumonia due to infectious organism   Diabetic foot infection (HCC)   Troponin level elevated    Hospital Course: 77yo with h/o CAD s/p stent, chronic combined CHF, stage 4 CKD, DM, and HTN who presented on 6/12 with SOB.  He was treated for acute on chronic CHF and effectively diuresed.  Disposition has been complicated, as he was recommended for SNF rehab but denied by insurance despite Peer-to-Peer.  He will be discharged to home with [facility] assistance.  Assessment and Plan:  Acute on chronic combined CHF, acute hypoxic respiratory failure Imaging on admission of the CT of the chest did not show any PE but it did show CHF with pulmonary edema and bilateral pleural effusions Has been diuresed and now placed on torsemide  with improvement   He is net -5.1 L, now reports feeling much better and close to baseline Continue Bidil    CAP Possible pneumonia overlay RSV, Covid, flu  all negative Status post 5 days of ceftriaxone /azithromycin  Respiratory status stable C/o nasal drip - will change O2 to humidified with nasal saline prn   Troponin elevation Flat, not in a pattern consistent with ACS Likely demand ischemia in the setting of CHF   Epigastric discomfort /intermittent anxiety Improved after Reglan  Can be discharged on Reglan  PRN, Zoloft , Bentyl  as well Continue good bowel regimen   Macrocytic anemia  Hemoglobin dipped to 6.0 on 6/18, received 1 unit of packed red blood cells GI consulted and performed EGD which showed nonbleeding gastric ulcer, mucosal changes in the duodenal, and biopsies showed chronic active gastritis with ulcer and purulent exudates, iron  deposits suggestive of iron  pill gastritis Colonoscopy was offered but he declined   Essential hypertension Continue Bidil  Hold amlodipine  and Toprol  XL   Acute kidney injury on CKD stage IV  Baseline creatinine between 2 and 3, it was as high as 3.58 on 6/18 Improved back to baseline  Avoid nephrotoxic medications   CAD s/p stent  No chest discomfort Continue atorvastatin    DMII  Hemoglobin A1c 6.0, good control Continue Lantus    Chronic diabetic foot ulcer b/l  Per wound care Unstageable Pressure Injury right and left heel; left and right lateral feet'; all are 100% eschar  Arterial ulceration left medial great toe; 100% clean Pressure Injury POA: Yes Cleanse all foot wounds with saline pat dry Paint all foot wounds with betadine, allow to air dry. Ok to cover with foam. Reapply betadine daily.  Offload heels at all times with prevalon boots Single layer  of xeroform to the great toe wound, top with dry dressing or bandaid. Change daily Patient was supposed to follow-up with vascular surgery but did not after last hospitalization Treated with doxycycline  for 10 days ABI normal on the right and moderate left lower extremity arterial disease.   Will need follow-up with vascular  surgery as outpatient   Disposition Patient has been medically stable for dc for some time He was declined by insurance for SNF rehab since he has been a LTC SNF patient He owes money to [facility] and cannot return there If unable to afford LTC, he will need to go home with home health support Per [facility] team notes, the plan will be home with [facility] with hospital bed and [facility] lift           Consultants: GI PT OT TOC team   Procedures: Echocardiogram 6/16 ABI 6/16 EGD 6/21   Antibiotics: Azithromycin  x 1 Ceftriaxone  x 6 doses      Pain control - [place]  Controlled Substance Reporting System database was reviewed. and patient was instructed, not to drive, operate heavy machinery, perform activities at heights, swimming or participation in water activities or provide baby-sitting services while on Pain, Sleep and Anxiety Medications; until their outpatient Physician has advised to do so again. Also recommended to not to take more than prescribed Pain, Sleep and Anxiety Medications.    Disposition: Home health Diet recommendation:  Discharge Diet Orders (From admission, onward)     Start     Ordered   11/11/23 0000  Diet - low sodium heart healthy        11/11/23 0841   11/11/23 0000  Diet Carb Modified        11/11/23 0841           Carb modified diet DISCHARGE MEDICATION: Allergies as of 11/19/2023       Reactions   Nsaids Other (See Comments)   CKD stage 3         Medication List     PAUSE taking these medications    amLODipine  10 MG tablet Wait to take this until your doctor or other care provider tells you to start again. Commonly known as: NORVASC  Take 10 mg by mouth daily.   metoprolol  succinate 100 MG 24 hr tablet Wait to take this until your doctor or other care provider tells you to start again. Commonly known as: TOPROL -XL Take 100 mg by mouth daily.       STOP taking these medications    insulin  glargine-yfgn 100  UNIT/ML injection Commonly known as: SEMGLEE        TAKE these medications    acetaminophen  500 MG tablet Commonly known as: TYLENOL  Take 1,000 mg by mouth in the morning and at bedtime.   albuterol  108 (90 Base) MCG/ACT inhaler Commonly known as: VENTOLIN  HFA Inhale 2 puffs into the lungs every 6 (six) hours as needed for wheezing or shortness of breath.   aspirin  EC 81 MG tablet Take 81 mg by mouth daily.   atorvastatin  80 MG tablet Commonly known as: LIPITOR  Take 80 mg by mouth at bedtime.   bisacodyl  10 MG suppository Commonly known as: DULCOLAX Place 10 mg rectally daily as needed for moderate constipation.   bismuth subsalicylate 262 MG/15ML suspension Commonly known as: PEPTO BISMOL Take 30 mLs by mouth daily as needed for diarrhea or loose stools.   cholecalciferol  25 MCG (1000 UNIT) tablet Commonly known as: VITAMIN D3 Take 5,000 Units by mouth  daily.   cyanocobalamin 1000 MCG tablet Commonly known as: VITAMIN B12 Take 2,000 mcg by mouth daily.   dextromethorphan-guaiFENesin  30-600 MG 12hr tablet Commonly known as: MUCINEX  DM Take 1 tablet by mouth 2 (two) times daily.   dicyclomine  10 MG capsule Commonly known as: BENTYL  Take 10 mg by mouth in the morning, at noon, in the evening, and at bedtime.   docusate sodium  100 MG capsule Commonly known as: COLACE Take 100 mg by mouth 2 (two) times daily.   ferrous sulfate  325 (65 FE) MG tablet Take 325 mg by mouth daily with breakfast.   gabapentin  100 MG capsule Commonly known as: NEURONTIN  Take 200 mg by mouth 3 (three) times daily.   insulin  aspart 100 UNIT/ML injection Commonly known as: novoLOG  Inject 0-6 Units into the skin 3 (three) times daily with meals.   isosorbide -hydrALAZINE  20-37.5 MG tablet Commonly known as: BIDIL  Take 2 tablets by mouth 3 (three) times daily.   Lantus  SoloStar 100 UNIT/ML Solostar Pen Generic drug: insulin  glargine Inject 12 Units into the skin daily.    latanoprost  0.005 % ophthalmic solution Commonly known as: XALATAN  Place 1 drop into both eyes at bedtime.   leptospermum manuka honey Pste paste Apply 1 Application topically daily. Apply to buttocks and left heel as directed   lubiprostone  24 MCG capsule Commonly known as: AMITIZA  Take 24 mcg by mouth daily.   melatonin 3 MG Tabs tablet Take 3 mg by mouth at bedtime.   methocarbamol  500 MG tablet Commonly known as: ROBAXIN  Take 1 tablet (500 mg total) by mouth every 6 (six) hours as needed for muscle spasms.   metoCLOPramide  5 MG tablet Commonly known as: Reglan  Take 1 tablet (5 mg total) by mouth 3 (three) times daily.   multivitamin with minerals Tabs tablet Take 1 tablet by mouth daily.   nitroGLYCERIN  0.4 MG SL tablet Commonly known as: NITROSTAT  Place 1 tablet (0.4 mg total) under the tongue every 5 (five) minutes as needed for chest pain.   ondansetron  4 MG tablet Commonly known as: ZOFRAN  Take 4 mg by mouth every 6 (six) hours as needed for nausea or vomiting.   OXYGEN Inhale 3 L/min into the lungs continuous.   pantoprazole  40 MG tablet Commonly known as: Protonix  Take 1 tablet (40 mg total) by mouth daily.   polyethylene glycol powder 17 GM/SCOOP powder Commonly known as: GLYCOLAX /MIRALAX  Take 1 capful (17 g) by mouth daily. What changed: additional instructions   pyridoxine  100 MG tablet Commonly known as: B-6 Take 100 mg by mouth daily.   sertraline  25 MG tablet Commonly known as: ZOLOFT  Take 25 mg by mouth daily.   torsemide  20 MG tablet Commonly known as: DEMADEX  Take 1 tablet (20 mg total) by mouth daily. What changed: when to take this   traMADol  50 MG tablet Commonly known as: ULTRAM  Take 1 tablet (50 mg total) by mouth 3 (three) times daily as needed for moderate pain (pain score 4-6) or severe pain (pain score 7-10).   traZODone  100 MG tablet Commonly known as: DESYREL  Take 100 mg by mouth at bedtime.   UNABLE TO FIND Take 30  mLs by mouth in the morning and at bedtime. Med Name: protein liquid               Durable Medical Equipment  (From admission, onward)           Start     Ordered   11/19/23 1614  DME Oxygen  Once  Question Answer Comment  Length of Need Lifetime   Mode or (Route) Nasal cannula   Liters per Minute 2   Frequency Continuous (stationary and portable oxygen unit needed)   Oxygen conserving device Yes   Oxygen delivery system Gas      11/19/23 1614   11/19/23 1605  For home use only DME Other see comment  Once       Comments: Deitra lift  Question:  Length of Need  Answer:  Lifetime   11/19/23 1604   11/19/23 1604  For home use only DME Hospital bed  Once       Question Answer Comment  Length of Need Lifetime   The above medical condition requires: Patient requires the ability to reposition frequently   Bed type Semi-electric      11/19/23 1604              Discharge Care Instructions  (From admission, onward)           Start     Ordered   11/19/23 0000  Discharge wound care:       Comments: Cleanse all foot wounds with saline pat dry Paint all foot wounds with betadine, allow to air dry. Ok to cover with foam. Reapply betadine daily.  Offload heels at all times with prevalon boots Single layer of xeroform to the great toe wound, top with dry dressing or bandaid. Change daily   11/19/23 1614            Discharge Exam:   Subjective: reports that he is having nasal congestion, rhinorrhea, and headache.  Otherwise, he is at his usual baseline from a respiratory standpoint.   Objective: Vitals:   11/18/23 2133 11/19/23 1106  BP: (!) 102/49 138/63  Pulse: (!) 52 60  Resp: 20   Temp:  97.9 F (36.6 C)  SpO2: 100% 100%    Intake/Output Summary (Last 24 hours) at 11/19/2023 1614 Last data filed at 11/19/2023 1216 Gross per 24 hour  Intake 240 ml  Output 1000 ml  Net -760 ml   Filed Weights   11/17/23 0715 11/18/23 1043 11/19/23 0821   Weight: 87.1 kg 82.4 kg 83.6 kg    Exam:  General:  Appears calm and comfortable and is in NAD, chronically very debilitated Eyes:  R ptosis (chronic) ENT:  grossly normal hearing, lips & tongue, mmm Cardiovascular:  RRR, no m/r/g. No LE edema.  Respiratory:   CTA bilaterally with no wheezes/rales/rhonchi.  Normal respiratory effort. Abdomen:  soft, NT, ND Skin:  no rash or induration seen on limited exam Musculoskeletal:  generalized weakness with developing contractures; BLE are in Prevalon boots Psychiatric:  blunted mood and affect, speech fluent and appropriate, AOx3 Neurologic:  R facial droop, generalized weakness  Data Reviewed: I have reviewed the patient's lab results since admission.  Pertinent labs for today include:   Glucose 135 BUN 62/Creatinine 2.54/GFR 25, stable WBC 6.9 Hgb 8.4, stable    Condition at discharge: stable  The results of significant diagnostics from this hospitalization (including imaging, microbiology, ancillary and laboratory) are listed below for reference.   Imaging Studies: VAS US  ABI WITH/WO TBI Result Date: 11/02/2023  LOWER EXTREMITY DOPPLER STUDY Patient Name:  David [name]  Date of Exam:   11/02/2023 Medical Rec #: [id number]         Accession #:    [id number] Date of Birth: 1945-05-24        Patient Gender: M Patient Age:   60  years Exam Location:  [facility] Procedure:      VAS US  ABI WITH/WO TBI Referring Phys: --------------------------------------------------------------------------------  Indications: Ulcerations to bilateral heels, right lateral foot, left outer              foot. High Risk Factors: Hypertension, hyperlipidemia, Diabetes, past history of                    smoking, coronary artery disease. Other Factors: CHF, CKD III.  Vascular Interventions: Status post Right great toe (2018) and 2nd toe                         amputations (2020), left 5th ray amputation (2022). Limitations: Today's exam was limited due  to bandages. Comparison Study: Prior ABI done 04/25/2023 Performing Technologist: Rachel Pellet RVS  Examination Guidelines: A complete evaluation includes at minimum, Doppler waveform signals and systolic blood pressure reading at the level of bilateral brachial, anterior tibial, and posterior tibial arteries, when vessel segments are accessible. Bilateral testing is considered an integral part of a complete examination. Photoelectric Plethysmograph (PPG) waveforms and toe systolic pressure readings are included as required and additional duplex testing as needed. Limited examinations for reoccurring indications may be performed as noted.  ABI Findings: +---------+------------------+-----+---------+----------+ Right    Rt Pressure (mmHg)IndexWaveform Comment    +---------+------------------+-----+---------+----------+ Brachial 147                    triphasic           +---------+------------------+-----+---------+----------+ PTA      182               1.24 biphasic            +---------+------------------+-----+---------+----------+ DP       176               1.20 biphasic            +---------+------------------+-----+---------+----------+ Great Toe                                amputation +---------+------------------+-----+---------+----------+ +---------+------------------+-----+---------+--------+ Left     Lt Pressure (mmHg)IndexWaveform Comment  +---------+------------------+-----+---------+--------+ Brachial 145                    triphasic         +---------+------------------+-----+---------+--------+ PTA      106               0.72 biphasic          +---------+------------------+-----+---------+--------+ DP       92                0.63 biphasic          +---------+------------------+-----+---------+--------+ Great Toe                       dampened bandages +---------+------------------+-----+---------+--------+  +-------+-----------+-----------------+------------+------------+ ABI/TBIToday's ABIToday's TBI      Previous ABIPrevious TBI +-------+-----------+-----------------+------------+------------+ Right  1.24       amputation       1.08        amputation   +-------+-----------+-----------------+------------+------------+ Left   0.72       dampened waveform0.63        0.87         +-------+-----------+-----------------+------------+------------+ Right ABIs appear increased compared to prior study on 04/25/2023. Left ABIs appear essentially unchanged compared  to prior study on 04/25/2023.  Summary: Right: Resting right ankle-brachial index is within normal range. Left: Resting left ankle-brachial index indicates moderate left lower extremity arterial disease. *See table(s) above for measurements and observations.  Electronically signed by Gaile New MD on 11/02/2023 at 11:54:32 AM.    Final    DG Chest 1 View Result Date: 11/01/2023 CLINICAL DATA:  Hypoxia, shortness of breath EXAM: CHEST  1 VIEW COMPARISON:  CT chest dated 10/29/2023 FINDINGS: Mild interstitial edema. Small right pleural effusion. Associated bilateral lower lobe opacities, likely atelectasis. Mild cardiomegaly. No pneumothorax. IMPRESSION: Mild interstitial edema. Small right pleural effusion. Electronically Signed   By: Pinkie Pebbles M.D.   On: 11/01/2023 20:45   ECHOCARDIOGRAM COMPLETE Result Date: 10/30/2023    ECHOCARDIOGRAM REPORT   Patient Name:   David [name] Date of Exam: 10/30/2023 Medical Rec #:  [id number]        Height:       74.0 in Accession #:    [id number]       Weight:       191.8 lb Date of Birth:  19-Sep-1945       BSA:          2.135 m Patient Age:    77 years         BP:           177/87 mmHg Patient Gender: M                HR:           60 bpm. Exam Location:  Inpatient Procedure: 2D Echo, Cardiac Doppler, Color Doppler and Strain Analysis (Both            Spectral and Color Flow Doppler were utilized  during procedure). Indications:    chf  History:        Patient has prior history of Echocardiogram examinations, most                 recent 06/30/2022.  Sonographer:    Therisa Crouch Referring Phys: [id number] SARA-MAIZ A THOMAS IMPRESSIONS  1. Left ventricular ejection fraction, by estimation, is 35 to 40%. The left ventricle has moderately decreased function. The left ventricle demonstrates global hypokinesis. There is mild left ventricular hypertrophy. Left ventricular diastolic parameters are consistent with Grade II diastolic dysfunction (pseudonormalization). Elevated left atrial pressure. The average left ventricular global longitudinal strain is -12.2 %. The global longitudinal strain is abnormal.  2. Right ventricular systolic function is mildly reduced. The right ventricular size is mildly enlarged. There is severely elevated pulmonary artery systolic pressure. The estimated right ventricular systolic pressure is 89.3 mmHg.  3. Left atrial size was mildly dilated.  4. The mitral valve is normal in structure. Mild mitral valve regurgitation. No evidence of mitral stenosis.  5. The aortic valve is tricuspid. Aortic valve regurgitation is not visualized. No aortic stenosis is present.  6. The inferior vena cava is dilated in size with <50% respiratory variability, suggesting right atrial pressure of 15 mmHg. FINDINGS  Left Ventricle: Left ventricular ejection fraction, by estimation, is 35 to 40%. The left ventricle has moderately decreased function. The left ventricle demonstrates global hypokinesis. The average left ventricular global longitudinal strain is -12.2 %. Strain was performed and the global longitudinal strain is abnormal. The left ventricular internal cavity size was normal in size. There is mild left ventricular hypertrophy. Left ventricular diastolic parameters are consistent with Grade II diastolic  dysfunction (pseudonormalization). Elevated left atrial pressure.  Right Ventricle: The right  ventricular size is mildly enlarged. No increase in right ventricular wall thickness. Right ventricular systolic function is mildly reduced. There is severely elevated pulmonary artery systolic pressure. The tricuspid regurgitant velocity is 4.31 m/s, and with an assumed right atrial pressure of 15 mmHg, the estimated right ventricular systolic pressure is 89.3 mmHg. Left Atrium: Left atrial size was mildly dilated. Right Atrium: Right atrial size was normal in size. Pericardium: There is no evidence of pericardial effusion. Mitral Valve: The mitral valve is normal in structure. Mild mitral valve regurgitation. No evidence of mitral valve stenosis. Tricuspid Valve: The tricuspid valve is normal in structure. Tricuspid valve regurgitation is mild. Aortic Valve: The aortic valve is tricuspid. Aortic valve regurgitation is not visualized. No aortic stenosis is present. Aortic valve mean gradient measures 3.0 mmHg. Aortic valve peak gradient measures 6.2 mmHg. Aortic valve area, by VTI measures 2.10 cm. Pulmonic Valve: The pulmonic valve was not well visualized. Pulmonic valve regurgitation is trivial. Aorta: The aortic root is normal in size and structure. Venous: The inferior vena cava is dilated in size with less than 50% respiratory variability, suggesting right atrial pressure of 15 mmHg. IAS/Shunts: The interatrial septum was not well visualized.  LEFT VENTRICLE PLAX 2D LVIDd:         4.80 cm      Diastology LVIDs:         3.70 cm      LV e' medial:    4.79 cm/s LV PW:         1.10 cm      LV E/e' medial:  8.6 LV IVS:        1.20 cm      LV e' lateral:   4.82 cm/s LVOT diam:     2.20 cm      LV E/e' lateral: 8.5 LV SV:         59 LV SV Index:   28           2D Longitudinal Strain LVOT Area:     3.80 cm     2D Strain GLS Avg:     -12.2 %  LV Volumes (MOD) LV vol d, MOD A2C: 172.0 ml LV vol d, MOD A4C: 144.0 ml LV vol s, MOD A2C: 97.4 ml LV vol s, MOD A4C: 85.7 ml LV SV MOD A2C:     74.6 ml LV SV MOD A4C:      144.0 ml LV SV MOD BP:      71.6 ml RIGHT VENTRICLE            IVC RV Basal diam:  4.60 cm    IVC diam: 2.30 cm RV S prime:     5.96 cm/s TAPSE (M-mode): 1.6 cm LEFT ATRIUM             Index        RIGHT ATRIUM           Index LA diam:        3.90 cm 1.83 cm/m   RA Area:     20.60 cm LA Vol (A2C):   81.6 ml 38.22 ml/m  RA Volume:   59.50 ml  27.87 ml/m LA Vol (A4C):   89.1 ml 41.73 ml/m LA Biplane Vol: 87.1 ml 40.79 ml/m  AORTIC VALVE AV Area (Vmax):    2.24 cm AV Area (Vmean):   2.32 cm AV Area (VTI):     2.10 cm AV Vmax:  125.00 cm/s AV Vmean:          84.700 cm/s AV VTI:            0.282 m AV Peak Grad:      6.2 mmHg AV Mean Grad:      3.0 mmHg LVOT Vmax:         73.80 cm/s LVOT Vmean:        51.700 cm/s LVOT VTI:          0.156 m LVOT/AV VTI ratio: 0.55  AORTA Ao Root diam: 3.00 cm MITRAL VALVE               TRICUSPID VALVE MV Area (PHT): 4.49 cm    TR Peak grad:   74.3 mmHg MV E velocity: 41.10 cm/s  TR Vmax:        431.00 cm/s MV A velocity: 58.30 cm/s MV E/A ratio:  0.70        SHUNTS                            Systemic VTI:  0.16 m                            Systemic Diam: 2.20 cm Lonni Nanas MD Electronically signed by Lonni Nanas MD Signature Date/Time: 10/30/2023/4:35:21 PM    Final    CT Angio Chest PE W and/or Wo Contrast Result Date: 10/29/2023 CLINICAL DATA:  Shortness of breath for 2 days getting worse. History of CHF. EXAM: CT ANGIOGRAPHY CHEST WITH CONTRAST TECHNIQUE: Multidetector CT imaging of the chest was performed using the standard protocol during bolus administration of intravenous contrast. Multiplanar CT image reconstructions and MIPs were obtained to evaluate the vascular anatomy. RADIATION DOSE REDUCTION: This exam was performed according to the departmental dose-optimization program which includes automated exposure control, adjustment of the mA and/or kV according to patient size and/or use of iterative reconstruction technique. CONTRAST:  60mL  OMNIPAQUE  IOHEXOL  350 MG/ML SOLN COMPARISON:  Same day chest radiograph and CT chest 05/05/2023 FINDINGS: Cardiovascular: Negative for acute pulmonary embolism. Dilated main pulmonary artery measuring 34 mm in diameter. Dilated ascending aorta measuring 40 mm in diameter. Cardiomegaly. Trace pericardial effusion. Coronary artery and aortic atherosclerotic calcification. Mediastinum/Nodes: Shotty mediastinal and hilar lymph nodes are favored reactive. Trachea and esophagus are unremarkable. Lungs/Pleura: Marked interlobular septal thickening throughout both lungs. Peribronchovascular ground-glass and consolidative opacities greatest in the right lower lobe. Moderate right small left pleural effusions. No pneumothorax. Upper Abdomen: Question gastric wall thickening versus underdistention. Otherwise no acute abnormality. Musculoskeletal: No acute fracture. Review of the MIP images confirms the above findings. IMPRESSION: 1. Negative for acute pulmonary embolism. 2. CHF with pulmonary edema. Moderate right and small left pleural effusions. Superimposed pneumonia in the right lower lobe is not excluded. 3. Dilated main pulmonary artery, which can be seen in the setting of pulmonary hypertension. 4. Question gastritis versus underdistention. 5. Aortic Atherosclerosis (ICD10-I70.0). Electronically Signed   By: Norman Gatlin M.D.   On: 10/29/2023 21:20   DG Chest Portable 1 View Result Date: 10/29/2023 CLINICAL DATA:  Shortness of breath EXAM: PORTABLE CHEST 1 VIEW COMPARISON:  05/19/2023 FINDINGS: Heart is mildly enlarged. Mild vascular congestion. Small right pleural effusion again noted with peripheral airspace opacities throughout the right lung, similar prior study. Diffuse interstitial opacities concerning for edema. IMPRESSION: Cardiomegaly with vascular congestion and probable interstitial edema. Small right pleural effusion  with chronic peripheral densities in the right mid and lower lung, favor scarring.  Electronically Signed   By: Franky Crease M.D.   On: 10/29/2023 18:35    Microbiology: Results for orders placed or performed during the hospital encounter of 10/29/23  Culture, blood (routine x 2) Call MD if unable to obtain prior to antibiotics being given     Status: None   Collection Time: 10/30/23  2:06 AM   Specimen: BLOOD LEFT ARM  Result Value Ref Range Status   Specimen Description   Final    BLOOD LEFT ARM Performed at [facility], [address] [place], [place] [address]    Special Requests   Final    BOTTLES DRAWN AEROBIC AND ANAEROBIC Blood Culture results may not be optimal due to an inadequate volume of blood received in culture bottles Performed at [facility], [address], [place], [place] [address]    Culture   Final    NO GROWTH 5 DAYS Performed at [facility], [address], [place], [place] [address]    Report Status 11/04/2023 FINAL  Final  Culture, blood (routine x 2) Call MD if unable to obtain prior to antibiotics being given     Status: None   Collection Time: 10/30/23  5:28 AM   Specimen: BLOOD RIGHT HAND  Result Value Ref Range Status   Specimen Description   Final    BLOOD RIGHT HAND Performed at [facility], [address], [place], [place] [address]    Special Requests   Final    BOTTLES DRAWN AEROBIC AND ANAEROBIC Blood Culture results may not be optimal due to an inadequate volume of blood received in culture bottles Performed at [facility], [address], [place], [place] [address]    Culture   Final    NO GROWTH 5 DAYS Performed at [facility], [address] [place], [place] [address]    Report Status 11/04/2023 FINAL  Final    Labs: CBC: Recent Labs  Lab 11/15/23 0643 11/19/23 0918  WBC 8.8 6.9  NEUTROABS  --  4.8  HGB 8.7* 8.4*  HCT 28.6* 27.3*  MCV 108.3* 105.8*  PLT 226 205   Basic Metabolic Panel: Recent Labs  Lab 11/15/23 0643 11/19/23 0918   NA 136 138  K 3.7 4.1  CL 99 98  CO2 26 31  GLUCOSE 185* 135*  BUN 52* 62*  CREATININE 2.42* 2.54*  CALCIUM  8.6* 8.6*   Liver Function Tests: Recent Labs  Lab 11/15/23 0643  AST 24  ALT 18  ALKPHOS 81  BILITOT 0.6  PROT 7.3  ALBUMIN  2.7*   CBG: Recent Labs  Lab 11/18/23 1154 11/18/23 1733 11/18/23 2130 11/19/23 0735 11/19/23 1143  GLUCAP 171* 157* 212* 130* 252*    Discharge time spent: greater than 30 minutes.  Signed: Delon Herald, MD [facility] 11/19/2023

## 2023-11-20 DIAGNOSIS — I5043 Acute on chronic combined systolic (congestive) and diastolic (congestive) heart failure: Secondary | ICD-10-CM | POA: Diagnosis not present

## 2023-11-20 LAB — GLUCOSE, CAPILLARY
Glucose-Capillary: 135 mg/dL — ABNORMAL HIGH (ref 70–99)
Glucose-Capillary: 218 mg/dL — ABNORMAL HIGH (ref 70–99)
Glucose-Capillary: 163 mg/dL — ABNORMAL HIGH (ref 70–99)
Glucose-Capillary: 112 mg/dL — ABNORMAL HIGH (ref 70–99)

## 2023-11-20 MED ORDER — METOCLOPRAMIDE HCL 5 MG PO TABS: 5.0000 mg | ORAL_TABLET | Freq: Three times a day (TID) | ORAL | 0 refills | Status: DC

## 2023-11-20 MED ORDER — INSULIN ASPART 100 UNIT/ML IJ SOLN: 0.0000 [IU] | Freq: Three times a day (TID) | INTRAMUSCULAR | 0 refills | Status: DC

## 2023-11-20 NOTE — Progress Notes (Addendum)
 AVS reviewed w/ patient.  Pt will go home via [facility]. During review of AVS, this patient  stated he has no way to pick up meds prescribed. He also states he has no medication at home because he has been in a rest home since November Secure chat sent to Dr Barbarann stating It may be better to send him tomorrow so his discharge meds can be filled at the outpatient pharmacy. They will be open on Saturday from 0830 - 1600. Primary nurse also updated verbally by this RN.

## 2023-11-20 NOTE — Plan of Care (Signed)
  Problem: Education: Goal: Ability to describe self-care measures that may prevent or decrease complications (Diabetes Survival Skills Education) will improve Outcome: Progressing Goal: Individualized Educational Video(s) Outcome: Progressing   Problem: Coping: Goal: Ability to adjust to condition or change in health will improve Outcome: Progressing   Problem: Fluid Volume: Goal: Ability to maintain a balanced intake and output will improve Outcome: Progressing   Problem: Health Behavior/Discharge Planning: Goal: Ability to identify and utilize available resources and services will improve Outcome: Progressing Goal: Ability to manage health-related needs will improve Outcome: Progressing   Problem: Metabolic: Goal: Ability to maintain appropriate glucose levels will improve Outcome: Progressing   Problem: Nutritional: Goal: Maintenance of adequate nutrition will improve Outcome: Progressing Goal: Progress toward achieving an optimal weight will improve Outcome: Progressing   Problem: Skin Integrity: Goal: Risk for impaired skin integrity will decrease Outcome: Progressing   Problem: Tissue Perfusion: Goal: Adequacy of tissue perfusion will improve Outcome: Progressing   Problem: Education: Goal: Ability to demonstrate management of disease process will improve Outcome: Progressing Goal: Ability to verbalize understanding of medication therapies will improve Outcome: Progressing Goal: Individualized Educational Video(s) Outcome: Progressing   Problem: Activity: Goal: Capacity to carry out activities will improve Outcome: Progressing   Problem: Cardiac: Goal: Ability to achieve and maintain adequate cardiopulmonary perfusion will improve Outcome: Progressing   Problem: Activity: Goal: Ability to tolerate increased activity will improve Outcome: Progressing   Problem: Clinical Measurements: Goal: Ability to maintain a body temperature in the normal range will  improve Outcome: Progressing   Problem: Respiratory: Goal: Ability to maintain adequate ventilation will improve Outcome: Progressing Goal: Ability to maintain a clear airway will improve Outcome: Progressing   Problem: Education: Goal: Knowledge of General Education information will improve Description: Including pain rating scale, medication(s)/side effects and non-pharmacologic comfort measures Outcome: Progressing   Problem: Health Behavior/Discharge Planning: Goal: Ability to manage health-related needs will improve Outcome: Progressing   Problem: Clinical Measurements: Goal: Ability to maintain clinical measurements within normal limits will improve Outcome: Progressing Goal: Will remain free from infection Outcome: Progressing Goal: Diagnostic test results will improve Outcome: Progressing Goal: Respiratory complications will improve Outcome: Progressing Goal: Cardiovascular complication will be avoided Outcome: Progressing   Problem: Activity: Goal: Risk for activity intolerance will decrease Outcome: Progressing   Problem: Nutrition: Goal: Adequate nutrition will be maintained Outcome: Progressing   Problem: Coping: Goal: Level of anxiety will decrease Outcome: Progressing   Problem: Elimination: Goal: Will not experience complications related to bowel motility Outcome: Progressing Goal: Will not experience complications related to urinary retention Outcome: Progressing   Problem: Pain Managment: Goal: General experience of comfort will improve and/or be controlled Outcome: Progressing   Problem: Safety: Goal: Ability to remain free from injury will improve Outcome: Progressing   Problem: Skin Integrity: Goal: Risk for impaired skin integrity will decrease Outcome: Progressing   Problem: Skin Integrity: Goal: Risk for impaired skin integrity will decrease Outcome: Progressing

## 2023-11-20 NOTE — Progress Notes (Signed)
 [facility] canceled transport due to patient discharging home without medications at home. Call to [name] (cousin) by [facility] to see if she was at pt's home. Pt's cousin stated she was at home in [place], [place] and was not able to return to [facility]. Pt's wife should be at home per cousin, but she has been acting strangely per cousin. APS case opened on pt's wife this past week per TOC. Cousin states home does not have a working bathroom on the lower level, mold has been in the home for at least 20 years and there are pathways to get to places in the home due to hoarding Home meds will be need to be filled at [facility] so pt will have discharge meds. No delivery on Saturday unless a staff member can pick up meds. [facility] will need to be rescheduled. Dr Barbarann updated by primary nurse.

## 2023-11-20 NOTE — Care Management Important Message (Signed)
 Important Message  Patient Details IM Letter given. Name: David [name] MRN: [id number] Date of Birth: 1945-07-07   Important Message Given:  Yes - Medicare IM     [name] 11/20/2023, 11:20 AM

## 2023-11-20 NOTE — Discharge Summary (Signed)
 Physician Discharge Summary   Patient: David [name] MRN: [id number] DOB: 07-Mar-1946  Admit date:     10/29/2023  Discharge date: 11/20/23  Discharge Physician: Delon Herald   PCP: Rexanne Ingle, MD   Recommendations at discharge:   You have no more available rehabilitation days in SNF and so would have to pay out of pocket You would have to have Medicaid in order for long-term care to be an option, other than paying out of pocket In the meantime, you are being discharged with maximal home support - hospital bed, [facility] lift, physical/occupational therapy, SW, RN, and aide Home health has been arranged to see you tomorrow, 7/4, by [facility] O2 is also arranged with humidification Hold amlodipine  and Toprol  XL Follow up with Dr. Micki in 1-2 weeks Follow up with vascular surgery; referral placed  Discharge Diagnoses: Principal Problem:   Acute on chronic combined systolic and diastolic CHF (congestive heart failure) (HCC) Active Problems:   Essential hypertension   Type 2 diabetes mellitus with hyperlipidemia (HCC)   Pneumonia due to infectious organism   Diabetic foot infection (HCC)   Troponin level elevated    Hospital Course: 78yo with h/o CAD s/p stent, chronic combined CHF, stage 4 CKD, DM, and HTN who presented on 6/12 with SOB.  He was treated for acute on chronic CHF and effectively diuresed.  Disposition has been complicated, as he was recommended for SNF rehab but denied by insurance despite Peer-to-Peer.  He will be discharged to home with [facility] assistance.  Assessment and Plan:  Acute on chronic combined CHF, acute hypoxic respiratory failure Imaging on admission of the CT of the chest did not show any PE but it did show CHF with pulmonary edema and bilateral pleural effusions Has been diuresed and now placed on torsemide  with improvement   He is net -5.1 L, now reports feeling much better and close to baseline Continue Bidil    CAP Possible  pneumonia overlay RSV, Covid, flu all negative Status post 5 days of ceftriaxone /azithromycin  Respiratory status stable C/o nasal drip - will change O2 to humidified with nasal saline prn   Troponin elevation Flat, not in a pattern consistent with ACS Likely demand ischemia in the setting of CHF   Epigastric discomfort /intermittent anxiety Improved after Reglan  Can be discharged on Reglan  PRN, Zoloft , Bentyl  as well Continue good bowel regimen   Macrocytic anemia  Hemoglobin dipped to 6.0 on 6/18, received 1 unit of packed red blood cells GI consulted and performed EGD which showed nonbleeding gastric ulcer, mucosal changes in the duodenal, and biopsies showed chronic active gastritis with ulcer and purulent exudates, iron  deposits suggestive of iron  pill gastritis Colonoscopy was offered but he declined   Essential hypertension Continue Bidil  Hold amlodipine  and Toprol  XL   Acute kidney injury on CKD stage IV  Baseline creatinine between 2 and 3, it was as high as 3.58 on 6/18 Improved back to baseline  Avoid nephrotoxic medications   CAD s/p stent  No chest discomfort Continue atorvastatin    DMII  Hemoglobin A1c 6.0, good control Continue Lantus    Chronic diabetic foot ulcer b/l  Per wound care Unstageable Pressure Injury right and left heel; left and right lateral feet'; all are 100% eschar  Arterial ulceration left medial great toe; 100% clean Pressure Injury POA: Yes Cleanse all foot wounds with saline pat dry Paint all foot wounds with betadine, allow to air dry. Ok to cover with foam. Reapply betadine daily.  Offload heels at  all times with prevalon boots Single layer of xeroform to the great toe wound, top with dry dressing or bandaid. Change daily Patient was supposed to follow-up with vascular surgery but did not after last hospitalization Treated with doxycycline  for 10 days ABI normal on the right and moderate left lower extremity arterial disease.   Will  need follow-up with vascular surgery as outpatient   Disposition Patient has been medically stable for dc for some time He was declined by insurance for SNF rehab since he has been a LTC SNF patient He owes money to [facility] and cannot return there If unable to afford LTC, he will need to go home with home health support Per [facility] team notes, the plan will be home with [facility] with hospital bed and [facility] lift           Consultants: GI PT OT TOC team   Procedures: Echocardiogram 6/16 ABI 6/16 EGD 6/21   Antibiotics: Azithromycin  x 1 Ceftriaxone  x 6 doses   Pain control - [place]  Controlled Substance Reporting System database was reviewed. and patient was instructed, not to drive, operate heavy machinery, perform activities at heights, swimming or participation in water activities or provide baby-sitting services while on Pain, Sleep and Anxiety Medications; until their outpatient Physician has advised to do so again. Also recommended to not to take more than prescribed Pain, Sleep and Anxiety Medications.   Disposition: Home health Diet recommendation:  Discharge Diet Orders (From admission, onward)     Start     Ordered   11/11/23 0000  Diet - low sodium heart healthy        11/11/23 0841   11/11/23 0000  Diet Carb Modified        11/11/23 0841           Cardiac diet DISCHARGE MEDICATION: Allergies as of 11/20/2023       Reactions   Nsaids Other (See Comments)   CKD stage 3         Medication List     PAUSE taking these medications    amLODipine  10 MG tablet Wait to take this until your doctor or other care provider tells you to start again. Commonly known as: NORVASC  Take 10 mg by mouth daily.   metoprolol  succinate 100 MG 24 hr tablet Wait to take this until your doctor or other care provider tells you to start again. Commonly known as: TOPROL -XL Take 100 mg by mouth daily.       STOP taking these medications    insulin   glargine-yfgn 100 UNIT/ML injection Commonly known as: SEMGLEE        TAKE these medications    acetaminophen  500 MG tablet Commonly known as: TYLENOL  Take 1,000 mg by mouth in the morning and at bedtime.   albuterol  108 (90 Base) MCG/ACT inhaler Commonly known as: VENTOLIN  HFA Inhale 2 puffs into the lungs every 6 (six) hours as needed for wheezing or shortness of breath.   aspirin  EC 81 MG tablet Take 81 mg by mouth daily.   atorvastatin  80 MG tablet Commonly known as: LIPITOR  Take 80 mg by mouth at bedtime.   bisacodyl  10 MG suppository Commonly known as: DULCOLAX Place 10 mg rectally daily as needed for moderate constipation.   bismuth subsalicylate 262 MG/15ML suspension Commonly known as: PEPTO BISMOL Take 30 mLs by mouth daily as needed for diarrhea or loose stools.   cholecalciferol  25 MCG (1000 UNIT) tablet Commonly known as: VITAMIN D3 Take 5,000 Units  by mouth daily.   cyanocobalamin  1000 MCG tablet Commonly known as: VITAMIN B12 Take 2,000 mcg by mouth daily.   dextromethorphan -guaiFENesin  30-600 MG 12hr tablet Commonly known as: MUCINEX  DM Take 1 tablet by mouth 2 (two) times daily.   dicyclomine  10 MG capsule Commonly known as: BENTYL  Take 10 mg by mouth in the morning, at noon, in the evening, and at bedtime.   docusate sodium  100 MG capsule Commonly known as: COLACE Take 100 mg by mouth 2 (two) times daily.   ferrous sulfate  325 (65 FE) MG tablet Take 325 mg by mouth daily with breakfast.   gabapentin  100 MG capsule Commonly known as: NEURONTIN  Take 200 mg by mouth 3 (three) times daily.   insulin  aspart 100 UNIT/ML injection Commonly known as: novoLOG  Inject 0-6 Units into the skin 3 (three) times daily with meals.   isosorbide -hydrALAZINE  20-37.5 MG tablet Commonly known as: BIDIL  Take 2 tablets by mouth 3 (three) times daily.   Lantus  SoloStar 100 UNIT/ML Solostar Pen Generic drug: insulin  glargine Inject 12 Units into the skin  daily.   latanoprost  0.005 % ophthalmic solution Commonly known as: XALATAN  Place 1 drop into both eyes at bedtime.   leptospermum manuka honey Pste paste Apply 1 Application topically daily. Apply to buttocks and left heel as directed   lubiprostone  24 MCG capsule Commonly known as: AMITIZA  Take 24 mcg by mouth daily.   melatonin 3 MG Tabs tablet Take 3 mg by mouth at bedtime.   methocarbamol  500 MG tablet Commonly known as: ROBAXIN  Take 1 tablet (500 mg total) by mouth every 6 (six) hours as needed for muscle spasms.   metoCLOPramide  5 MG tablet Commonly known as: Reglan  Take 1 tablet (5 mg total) by mouth 3 (three) times daily.   multivitamin with minerals Tabs tablet Take 1 tablet by mouth daily.   nitroGLYCERIN  0.4 MG SL tablet Commonly known as: NITROSTAT  Place 1 tablet (0.4 mg total) under the tongue every 5 (five) minutes as needed for chest pain.   ondansetron  4 MG tablet Commonly known as: ZOFRAN  Take 4 mg by mouth every 6 (six) hours as needed for nausea or vomiting.   OXYGEN Inhale 3 L/min into the lungs continuous.   pantoprazole  40 MG tablet Commonly known as: Protonix  Take 1 tablet (40 mg total) by mouth daily.   polyethylene glycol powder 17 GM/SCOOP powder Commonly known as: GLYCOLAX /MIRALAX  Take 1 capful (17 g) by mouth daily. What changed: additional instructions   pyridoxine  100 MG tablet Commonly known as: B-6 Take 100 mg by mouth daily.   sertraline  25 MG tablet Commonly known as: ZOLOFT  Take 25 mg by mouth daily.   torsemide  20 MG tablet Commonly known as: DEMADEX  Take 1 tablet (20 mg total) by mouth daily. What changed: when to take this   traMADol  50 MG tablet Commonly known as: ULTRAM  Take 1 tablet (50 mg total) by mouth 3 (three) times daily as needed for moderate pain (pain score 4-6) or severe pain (pain score 7-10).   traZODone  100 MG tablet Commonly known as: DESYREL  Take 100 mg by mouth at bedtime.   UNABLE TO  FIND Take 30 mLs by mouth in the morning and at bedtime. Med Name: protein liquid               Durable Medical Equipment  (From admission, onward)           Start     Ordered   11/19/23 1616  DME Oxygen  Once  Comments: Include humidifier  Question Answer Comment  Length of Need Lifetime   Mode or (Route) Nasal cannula   Liters per Minute 2   Frequency Continuous (stationary and portable oxygen unit needed)   Oxygen conserving device Yes   Oxygen delivery system Gas      11/19/23 1616   11/19/23 1605  For home use only DME Other see comment  Once       Comments: Deitra lift  Question:  Length of Need  Answer:  Lifetime   11/19/23 1604   11/19/23 1604  For home use only DME Hospital bed  Once       Question Answer Comment  Length of Need Lifetime   The above medical condition requires: Patient requires the ability to reposition frequently   Bed type Semi-electric      11/19/23 1604              Discharge Care Instructions  (From admission, onward)           Start     Ordered   11/19/23 0000  Discharge wound care:       Comments: Cleanse all foot wounds with saline pat dry Paint all foot wounds with betadine, allow to air dry. Ok to cover with foam. Reapply betadine daily.  Offload heels at all times with prevalon boots Single layer of xeroform to the great toe wound, top with dry dressing or bandaid. Change daily   11/19/23 1614            Discharge Exam:   Subjective: Feels about the same.  Did not leave yesterday due to ongoing coordination of care but all has been arranged and he should be ready for dc today.   Objective: Vitals:   11/20/23 0536 11/20/23 1320  BP: (!) 141/61 135/61  Pulse: (!) 58 (!) 59  Resp: 20 19  Temp: 98.1 F (36.7 C) 98.6 F (37 C)  SpO2: 99% 100%    Intake/Output Summary (Last 24 hours) at 11/20/2023 1358 Last data filed at 11/20/2023 1321 Gross per 24 hour  Intake 870 ml  Output 1650 ml  Net -780  ml   Filed Weights   11/18/23 1043 11/19/23 0821 11/20/23 0500  Weight: 82.4 kg 83.6 kg 83 kg    Exam:  General:  Appears calm and comfortable and is in NAD, chronically very debilitated Eyes:  R ptosis (chronic) ENT:  grossly normal hearing, lips & tongue, mmm Cardiovascular:  RRR, no m/r/g. No LE edema.  Respiratory:   CTA bilaterally with no wheezes/rales/rhonchi.  Normal respiratory effort. Abdomen:  soft, NT, ND Skin:  no rash or induration seen on limited exam Musculoskeletal:  generalized weakness with developing contractures; BLE are in Prevalon boots Psychiatric:  blunted mood and affect, speech fluent and appropriate, AOx3 Neurologic:  R facial droop, generalized weakness  Data Reviewed: I have reviewed the patient's lab results since admission.  Pertinent labs for today include:   None today    Condition at discharge: fair  The results of significant diagnostics from this hospitalization (including imaging, microbiology, ancillary and laboratory) are listed below for reference.   Imaging Studies: VAS US  ABI WITH/WO TBI Result Date: 11/02/2023  LOWER EXTREMITY DOPPLER STUDY Patient Name:  David [name]  Date of Exam:   11/02/2023 Medical Rec #: [id number]         Accession #:    [id number] Date of Birth: [birth year]        Patient Gender: M Patient  Age:   74 years Exam Location:  [facility] Procedure:      VAS US  ABI WITH/WO TBI Referring Phys: --------------------------------------------------------------------------------  Indications: Ulcerations to bilateral heels, right lateral foot, left outer              foot. High Risk Factors: Hypertension, hyperlipidemia, Diabetes, past history of                    smoking, coronary artery disease. Other Factors: CHF, CKD III.  Vascular Interventions: Status post Right great toe (2018) and 2nd toe                         amputations (2020), left 5th ray amputation (2022). Limitations: Today's exam was limited due to  bandages. Comparison Study: Prior ABI done 04/25/2023 Performing Technologist: Rachel Pellet RVS  Examination Guidelines: A complete evaluation includes at minimum, Doppler waveform signals and systolic blood pressure reading at the level of bilateral brachial, anterior tibial, and posterior tibial arteries, when vessel segments are accessible. Bilateral testing is considered an integral part of a complete examination. Photoelectric Plethysmograph (PPG) waveforms and toe systolic pressure readings are included as required and additional duplex testing as needed. Limited examinations for reoccurring indications may be performed as noted.  ABI Findings: +---------+------------------+-----+---------+----------+ Right    Rt Pressure (mmHg)IndexWaveform Comment    +---------+------------------+-----+---------+----------+ Brachial 147                    triphasic           +---------+------------------+-----+---------+----------+ PTA      182               1.24 biphasic            +---------+------------------+-----+---------+----------+ DP       176               1.20 biphasic            +---------+------------------+-----+---------+----------+ Great Toe                                amputation +---------+------------------+-----+---------+----------+ +---------+------------------+-----+---------+--------+ Left     Lt Pressure (mmHg)IndexWaveform Comment  +---------+------------------+-----+---------+--------+ Brachial 145                    triphasic         +---------+------------------+-----+---------+--------+ PTA      106               0.72 biphasic          +---------+------------------+-----+---------+--------+ DP       92                0.63 biphasic          +---------+------------------+-----+---------+--------+ Great Toe                       dampened bandages +---------+------------------+-----+---------+--------+  +-------+-----------+-----------------+------------+------------+ ABI/TBIToday's ABIToday's TBI      Previous ABIPrevious TBI +-------+-----------+-----------------+------------+------------+ Right  1.24       amputation       1.08        amputation   +-------+-----------+-----------------+------------+------------+ Left   0.72       dampened waveform0.63        0.87         +-------+-----------+-----------------+------------+------------+ Right ABIs appear increased compared to prior study on 04/25/2023. Left ABIs  appear essentially unchanged compared to prior study on 04/25/2023.  Summary: Right: Resting right ankle-brachial index is within normal range. Left: Resting left ankle-brachial index indicates moderate left lower extremity arterial disease. *See table(s) above for measurements and observations.  Electronically signed by Gaile New MD on 11/02/2023 at 11:54:32 AM.    Final    DG Chest 1 View Result Date: 11/01/2023 CLINICAL DATA:  Hypoxia, shortness of breath EXAM: CHEST  1 VIEW COMPARISON:  CT chest dated 10/29/2023 FINDINGS: Mild interstitial edema. Small right pleural effusion. Associated bilateral lower lobe opacities, likely atelectasis. Mild cardiomegaly. No pneumothorax. IMPRESSION: Mild interstitial edema. Small right pleural effusion. Electronically Signed   By: Pinkie Pebbles M.D.   On: 11/01/2023 20:45   ECHOCARDIOGRAM COMPLETE Result Date: 10/30/2023    ECHOCARDIOGRAM REPORT   Patient Name:   David [name] Date of Exam: 10/30/2023 Medical Rec #:  [id number]        Height:       74.0 in Accession #:    [id number]       Weight:       191.8 lb Date of Birth:  Sep 30, 1945       BSA:          2.135 m Patient Age:    77 years         BP:           177/87 mmHg Patient Gender: M                HR:           60 bpm. Exam Location:  Inpatient Procedure: 2D Echo, Cardiac Doppler, Color Doppler and Strain Analysis (Both            Spectral and Color Flow Doppler were utilized  during procedure). Indications:    chf  History:        Patient has prior history of Echocardiogram examinations, most                 recent 06/30/2022.  Sonographer:    Therisa Crouch Referring Phys: [id number] SARA-MAIZ A THOMAS IMPRESSIONS  1. Left ventricular ejection fraction, by estimation, is 35 to 40%. The left ventricle has moderately decreased function. The left ventricle demonstrates global hypokinesis. There is mild left ventricular hypertrophy. Left ventricular diastolic parameters are consistent with Grade II diastolic dysfunction (pseudonormalization). Elevated left atrial pressure. The average left ventricular global longitudinal strain is -12.2 %. The global longitudinal strain is abnormal.  2. Right ventricular systolic function is mildly reduced. The right ventricular size is mildly enlarged. There is severely elevated pulmonary artery systolic pressure. The estimated right ventricular systolic pressure is 89.3 mmHg.  3. Left atrial size was mildly dilated.  4. The mitral valve is normal in structure. Mild mitral valve regurgitation. No evidence of mitral stenosis.  5. The aortic valve is tricuspid. Aortic valve regurgitation is not visualized. No aortic stenosis is present.  6. The inferior vena cava is dilated in size with <50% respiratory variability, suggesting right atrial pressure of 15 mmHg. FINDINGS  Left Ventricle: Left ventricular ejection fraction, by estimation, is 35 to 40%. The left ventricle has moderately decreased function. The left ventricle demonstrates global hypokinesis. The average left ventricular global longitudinal strain is -12.2 %. Strain was performed and the global longitudinal strain is abnormal. The left ventricular internal cavity size was normal in size. There is mild left ventricular hypertrophy. Left ventricular diastolic parameters are consistent with Grade II diastolic  dysfunction (pseudonormalization).  Elevated left atrial pressure. Right Ventricle: The right  ventricular size is mildly enlarged. No increase in right ventricular wall thickness. Right ventricular systolic function is mildly reduced. There is severely elevated pulmonary artery systolic pressure. The tricuspid regurgitant velocity is 4.31 m/s, and with an assumed right atrial pressure of 15 mmHg, the estimated right ventricular systolic pressure is 89.3 mmHg. Left Atrium: Left atrial size was mildly dilated. Right Atrium: Right atrial size was normal in size. Pericardium: There is no evidence of pericardial effusion. Mitral Valve: The mitral valve is normal in structure. Mild mitral valve regurgitation. No evidence of mitral valve stenosis. Tricuspid Valve: The tricuspid valve is normal in structure. Tricuspid valve regurgitation is mild. Aortic Valve: The aortic valve is tricuspid. Aortic valve regurgitation is not visualized. No aortic stenosis is present. Aortic valve mean gradient measures 3.0 mmHg. Aortic valve peak gradient measures 6.2 mmHg. Aortic valve area, by VTI measures 2.10 cm. Pulmonic Valve: The pulmonic valve was not well visualized. Pulmonic valve regurgitation is trivial. Aorta: The aortic root is normal in size and structure. Venous: The inferior vena cava is dilated in size with less than 50% respiratory variability, suggesting right atrial pressure of 15 mmHg. IAS/Shunts: The interatrial septum was not well visualized.  LEFT VENTRICLE PLAX 2D LVIDd:         4.80 cm      Diastology LVIDs:         3.70 cm      LV e' medial:    4.79 cm/s LV PW:         1.10 cm      LV E/e' medial:  8.6 LV IVS:        1.20 cm      LV e' lateral:   4.82 cm/s LVOT diam:     2.20 cm      LV E/e' lateral: 8.5 LV SV:         59 LV SV Index:   28           2D Longitudinal Strain LVOT Area:     3.80 cm     2D Strain GLS Avg:     -12.2 %  LV Volumes (MOD) LV vol d, MOD A2C: 172.0 ml LV vol d, MOD A4C: 144.0 ml LV vol s, MOD A2C: 97.4 ml LV vol s, MOD A4C: 85.7 ml LV SV MOD A2C:     74.6 ml LV SV MOD A4C:      144.0 ml LV SV MOD BP:      71.6 ml RIGHT VENTRICLE            IVC RV Basal diam:  4.60 cm    IVC diam: 2.30 cm RV S prime:     5.96 cm/s TAPSE (M-mode): 1.6 cm LEFT ATRIUM             Index        RIGHT ATRIUM           Index LA diam:        3.90 cm 1.83 cm/m   RA Area:     20.60 cm LA Vol (A2C):   81.6 ml 38.22 ml/m  RA Volume:   59.50 ml  27.87 ml/m LA Vol (A4C):   89.1 ml 41.73 ml/m LA Biplane Vol: 87.1 ml 40.79 ml/m  AORTIC VALVE AV Area (Vmax):    2.24 cm AV Area (Vmean):   2.32 cm AV Area (VTI):     2.10 cm AV Vmax:  125.00 cm/s AV Vmean:          84.700 cm/s AV VTI:            0.282 m AV Peak Grad:      6.2 mmHg AV Mean Grad:      3.0 mmHg LVOT Vmax:         73.80 cm/s LVOT Vmean:        51.700 cm/s LVOT VTI:          0.156 m LVOT/AV VTI ratio: 0.55  AORTA Ao Root diam: 3.00 cm MITRAL VALVE               TRICUSPID VALVE MV Area (PHT): 4.49 cm    TR Peak grad:   74.3 mmHg MV E velocity: 41.10 cm/s  TR Vmax:        431.00 cm/s MV A velocity: 58.30 cm/s MV E/A ratio:  0.70        SHUNTS                            Systemic VTI:  0.16 m                            Systemic Diam: 2.20 cm Lonni Nanas MD Electronically signed by Lonni Nanas MD Signature Date/Time: 10/30/2023/4:35:21 PM    Final    CT Angio Chest PE W and/or Wo Contrast Result Date: 10/29/2023 CLINICAL DATA:  Shortness of breath for 2 days getting worse. History of CHF. EXAM: CT ANGIOGRAPHY CHEST WITH CONTRAST TECHNIQUE: Multidetector CT imaging of the chest was performed using the standard protocol during bolus administration of intravenous contrast. Multiplanar CT image reconstructions and MIPs were obtained to evaluate the vascular anatomy. RADIATION DOSE REDUCTION: This exam was performed according to the departmental dose-optimization program which includes automated exposure control, adjustment of the mA and/or kV according to patient size and/or use of iterative reconstruction technique. CONTRAST:  60mL  OMNIPAQUE  IOHEXOL  350 MG/ML SOLN COMPARISON:  Same day chest radiograph and CT chest 05/05/2023 FINDINGS: Cardiovascular: Negative for acute pulmonary embolism. Dilated main pulmonary artery measuring 34 mm in diameter. Dilated ascending aorta measuring 40 mm in diameter. Cardiomegaly. Trace pericardial effusion. Coronary artery and aortic atherosclerotic calcification. Mediastinum/Nodes: Shotty mediastinal and hilar lymph nodes are favored reactive. Trachea and esophagus are unremarkable. Lungs/Pleura: Marked interlobular septal thickening throughout both lungs. Peribronchovascular ground-glass and consolidative opacities greatest in the right lower lobe. Moderate right small left pleural effusions. No pneumothorax. Upper Abdomen: Question gastric wall thickening versus underdistention. Otherwise no acute abnormality. Musculoskeletal: No acute fracture. Review of the MIP images confirms the above findings. IMPRESSION: 1. Negative for acute pulmonary embolism. 2. CHF with pulmonary edema. Moderate right and small left pleural effusions. Superimposed pneumonia in the right lower lobe is not excluded. 3. Dilated main pulmonary artery, which can be seen in the setting of pulmonary hypertension. 4. Question gastritis versus underdistention. 5. Aortic Atherosclerosis (ICD10-I70.0). Electronically Signed   By: Norman Gatlin M.D.   On: 10/29/2023 21:20   DG Chest Portable 1 View Result Date: 10/29/2023 CLINICAL DATA:  Shortness of breath EXAM: PORTABLE CHEST 1 VIEW COMPARISON:  05/19/2023 FINDINGS: Heart is mildly enlarged. Mild vascular congestion. Small right pleural effusion again noted with peripheral airspace opacities throughout the right lung, similar prior study. Diffuse interstitial opacities concerning for edema. IMPRESSION: Cardiomegaly with vascular congestion and probable interstitial edema. Small right pleural effusion  with chronic peripheral densities in the right mid and lower lung, favor scarring.  Electronically Signed   By: Franky Crease M.D.   On: 10/29/2023 18:35    Microbiology: Results for orders placed or performed during the hospital encounter of 10/29/23  Culture, blood (routine x 2) Call MD if unable to obtain prior to antibiotics being given     Status: None   Collection Time: 10/30/23  2:06 AM   Specimen: BLOOD LEFT ARM  Result Value Ref Range Status   Specimen Description   Final    BLOOD LEFT ARM Performed at [facility], [address], [place], [place] [address]    Special Requests   Final    BOTTLES DRAWN AEROBIC AND ANAEROBIC Blood Culture results may not be optimal due to an inadequate volume of blood received in culture bottles Performed at [facility], [address] [place], [place] [address]    Culture   Final    NO GROWTH 5 DAYS Performed at [facility], [address] [place], [place] [address]    Report Status 11/04/2023 FINAL  Final  Culture, blood (routine x 2) Call MD if unable to obtain prior to antibiotics being given     Status: None   Collection Time: 10/30/23  5:28 AM   Specimen: BLOOD RIGHT HAND  Result Value Ref Range Status   Specimen Description   Final    BLOOD RIGHT HAND Performed at [facility], [address] [place], [place] [address]    Special Requests   Final    BOTTLES DRAWN AEROBIC AND ANAEROBIC Blood Culture results may not be optimal due to an inadequate volume of blood received in culture bottles Performed at [facility], [address], [place], [place] [address]    Culture   Final    NO GROWTH 5 DAYS Performed at [facility], [address], [place], [place] [address]    Report Status 11/04/2023 FINAL  Final    Labs: CBC: Recent Labs  Lab 11/15/23 0643 11/19/23 0918  WBC 8.8 6.9  NEUTROABS  --  4.8  HGB 8.7* 8.4*  HCT 28.6* 27.3*  MCV 108.3* 105.8*  PLT 226 205   Basic Metabolic Panel: Recent Labs  Lab 11/15/23 0643 11/19/23 0918   NA 136 138  K 3.7 4.1  CL 99 98  CO2 26 31  GLUCOSE 185* 135*  BUN 52* 62*  CREATININE 2.42* 2.54*  CALCIUM  8.6* 8.6*   Liver Function Tests: Recent Labs  Lab 11/15/23 0643  AST 24  ALT 18  ALKPHOS 81  BILITOT 0.6  PROT 7.3  ALBUMIN  2.7*   CBG: Recent Labs  Lab 11/19/23 1143 11/19/23 1752 11/19/23 2026 11/20/23 0752 11/20/23 1134  GLUCAP 252* 146* 160* 135* 218*    Discharge time spent: greater than 30 minutes.  Signed: Delon Herald, MD [facility] 11/20/2023

## 2023-11-20 NOTE — Progress Notes (Signed)
 PT Cancellation Note  Patient Details Name: David [name] MRN: [id number] DOB: [birth year]   Cancelled Treatment:    Reason Eval/Treat Not Completed: (P) Patient at procedure or test/unavailable. Pt discussing discharge options with social Investment banker, operational. Will try again as schedule allows, which may be another day  Elsie Grieves, PT, DPT [facility] Rehabilitation Department Office: [phone]   Elsie Grieves 11/20/2023, 9:40 AM

## 2023-11-20 NOTE — TOC Transition Note (Signed)
 Transition of Care [facility]) - Discharge Note   Patient Details  Name: David [name] MRN: [id number] Date of Birth: 1945-11-12  Transition of Care [facility]) CM/SW Contact:  Doneta Glenys DASEN, RN Phone Number: 11/20/2023, 2:17 PM   Clinical Narrative:    CM spoke to patient, spouse [name] and daughter Chole on speaker phone in patients room. CM explained that hospital bed and hoyer lift will be delivered this morning. Also that [facility] will be coming to the home with RN, PT, OT,Aid and SW. All 3 agreeable to plan and services to assist patient in the home.  [employer] called patients room stating will be delivering hospital bed and [employer]. Patients spouse [name] left hospital heading home to receive delivery of equipment. CM called patients cousin [name] requesting that she goes to patient home to assist with receiving patient. [name] agreed to be at the house no later than 3:00 PM.  Discharge packet on unit. Printed face sheet, medical necessity, and discharge summary. PTAR called at 2:30 PM TOC signing off.    Final next level of care: Home w Home Health Services Barriers to Discharge: Barriers Resolved   Patient Goals and CMS Choice Patient states their goals for this hospitalization and ongoing recovery are:: SNF CMS Medicare.gov Compare Post Acute Care list provided to:: Patient Choice offered to / list presented to : Patient [place] ownership interest in [facility].provided to:: Parent NA    Discharge Placement                  Name of family member notified: Raymond Distance, Cythnia Patient and family notified of of transfer: 11/20/23  Discharge Plan and Services Additional resources added to the After Visit Summary for   In-house Referral: NA Discharge Planning Services: CM Consult Post Acute Care Choice: NA          DME Arranged: Hospital bed, Other see comment [facility]) DME Agency: [employer] Date DME Agency Contacted: 11/19/23 Time  DME Agency Contacted: 1505 Representative spoke with at DME Agency: [facility] Arranged: RN, PT, OT, Nurse's Aide, Social Work [employer] Agency: [facility] Date HH Agency Contacted: 11/18/23 Time HH Agency Contacted: 0845 Representative spoke with at [facility] Agency: Don[facility]  Social Drivers of Health (SDOH) Interventions SDOH Screenings   Food Insecurity: No Food Insecurity (10/30/2023)  Housing: Low Risk  (10/30/2023)  Transportation Needs: No Transportation Needs (10/30/2023)  Utilities: Not At Risk (10/30/2023)  Alcohol Screen: Low Risk  (05/18/2023)  Financial Resource Strain: Low Risk  (05/18/2023)  Physical Activity: Inactive (08/20/2017)  Social Connections: Socially Integrated (10/30/2023)  Stress: No Stress Concern Present (08/20/2017)  Tobacco Use: Medium Risk (11/07/2023)     Readmission Risk Interventions    11/09/2023   11:30 AM 11/06/2023    8:44 AM  Readmission Risk Prevention Plan  Transportation Screening Complete Complete  Medication Review (RN Care Manager) Complete Complete  PCP or Specialist appointment within 3-5 days of discharge Complete   HRI or Home Care Consult Complete Complete  SW Recovery Care/Counseling Consult Complete Complete  Palliative Care Screening Not Applicable Not Applicable  Skilled Nursing Facility Complete Not Applicable

## 2023-11-21 ENCOUNTER — Other Ambulatory Visit (HOSPITAL_COMMUNITY): Payer: Self-pay

## 2023-11-21 LAB — GLUCOSE, CAPILLARY
Glucose-Capillary: 140 mg/dL — ABNORMAL HIGH (ref 70–99)
Glucose-Capillary: 185 mg/dL — ABNORMAL HIGH (ref 70–99)
Glucose-Capillary: 162 mg/dL — ABNORMAL HIGH (ref 70–99)

## 2023-11-21 MED ORDER — LATANOPROST 0.005 % OP SOLN
1.0000 [drp] | Freq: Every day | OPHTHALMIC | 0 refills | Status: DC
  Filled 2023-11-21: qty 2.5, 50d supply, fill #0

## 2023-11-21 MED ORDER — GABAPENTIN 100 MG PO CAPS
200.0000 mg | ORAL_CAPSULE | Freq: Three times a day (TID) | ORAL | 0 refills | Status: DC
  Filled 2023-11-21: qty 180, 30d supply, fill #0

## 2023-11-21 MED ORDER — VITAMIN B-12 1000 MCG PO TABS
2000.0000 ug | ORAL_TABLET | Freq: Every day | ORAL | 0 refills | Status: DC
  Filled 2023-11-21: qty 130, 65d supply, fill #0

## 2023-11-21 MED ORDER — TRAZODONE HCL 100 MG PO TABS
100.0000 mg | ORAL_TABLET | Freq: Every day | ORAL | 0 refills | Status: DC
  Filled 2023-11-21: qty 30, 30d supply, fill #0

## 2023-11-21 MED ORDER — DICYCLOMINE HCL 10 MG PO CAPS
10.0000 mg | ORAL_CAPSULE | Freq: Four times a day (QID) | ORAL | 0 refills | Status: DC
  Filled 2023-11-21: qty 120, 30d supply, fill #0

## 2023-11-21 MED ORDER — MEDIHONEY WOUND/BURN DRESSING EX PSTE: 1.0000 | PASTE | Freq: Every day | CUTANEOUS | 0 refills | Status: DC

## 2023-11-21 MED ORDER — TRAMADOL HCL 50 MG PO TABS
50.0000 mg | ORAL_TABLET | Freq: Three times a day (TID) | ORAL | 0 refills | Status: DC
  Filled 2023-11-21: qty 15, 5d supply, fill #0

## 2023-11-21 MED ORDER — ALBUTEROL SULFATE HFA 108 (90 BASE) MCG/ACT IN AERS
2.0000 | INHALATION_SPRAY | Freq: Four times a day (QID) | RESPIRATORY_TRACT | 2 refills | Status: DC | PRN
  Filled 2023-11-21: qty 6.7, 25d supply, fill #0

## 2023-11-21 MED ORDER — POLYETHYLENE GLYCOL 3350 17 GM/SCOOP PO POWD
17.0000 g | Freq: Every day | ORAL | 0 refills | Status: DC
  Filled 2023-11-21: qty 476, 28d supply, fill #0

## 2023-11-21 MED ORDER — ACETAMINOPHEN 500 MG PO TABS
1000.0000 mg | ORAL_TABLET | Freq: Two times a day (BID) | ORAL | 0 refills | Status: DC
  Filled 2023-11-21: qty 120, 30d supply, fill #0

## 2023-11-21 MED ORDER — METOCLOPRAMIDE HCL 5 MG PO TABS
5.0000 mg | ORAL_TABLET | Freq: Three times a day (TID) | ORAL | 0 refills | Status: DC
Start: 2023-11-21 — End: 2024-02-18
  Filled 2023-11-21: qty 90, 30d supply, fill #0

## 2023-11-21 MED ORDER — ASPIRIN EC 81 MG PO TBEC
81.0000 mg | DELAYED_RELEASE_TABLET | Freq: Every day | ORAL | 0 refills | Status: DC
Start: 2023-11-21 — End: 2024-02-18
  Filled 2023-11-21: qty 30, 30d supply, fill #0

## 2023-11-21 MED ORDER — TORSEMIDE 20 MG PO TABS
20.0000 mg | ORAL_TABLET | Freq: Every day | ORAL | 0 refills | Status: DC
Start: 2023-11-21 — End: 2024-01-07
  Filled 2023-11-21: qty 22, 22d supply, fill #0

## 2023-11-21 MED ORDER — BISACODYL 10 MG RE SUPP
10.0000 mg | Freq: Every day | RECTAL | 0 refills | Status: DC | PRN
Start: 2023-11-21 — End: 2024-02-18
  Filled 2023-11-21: qty 12, 12d supply, fill #0

## 2023-11-21 MED ORDER — DM-GUAIFENESIN ER 30-600 MG PO TB12
1.0000 | ORAL_TABLET | Freq: Two times a day (BID) | ORAL | 0 refills | Status: DC
  Filled 2023-11-21: qty 60, 30d supply, fill #0

## 2023-11-21 MED ORDER — DOCUSATE SODIUM 100 MG PO CAPS
100.0000 mg | ORAL_CAPSULE | Freq: Two times a day (BID) | ORAL | 0 refills | Status: DC
Start: 2023-11-21 — End: 2024-02-18
  Filled 2023-11-21: qty 100, 50d supply, fill #0

## 2023-11-21 MED ORDER — METHOCARBAMOL 500 MG PO TABS: 500.0000 mg | ORAL_TABLET | Freq: Four times a day (QID) | ORAL | 0 refills | Status: DC | PRN

## 2023-11-21 MED ORDER — TRAMADOL HCL 50 MG PO TABS: 50.0000 mg | ORAL_TABLET | Freq: Three times a day (TID) | ORAL | 0 refills | Status: DC | PRN

## 2023-11-21 MED ORDER — ALBUTEROL SULFATE HFA 108 (90 BASE) MCG/ACT IN AERS: 2.0000 | INHALATION_SPRAY | Freq: Four times a day (QID) | RESPIRATORY_TRACT | 2 refills | Status: DC | PRN

## 2023-11-21 MED ORDER — INSULIN ASPART 100 UNIT/ML IJ SOLN
0.0000 [IU] | Freq: Three times a day (TID) | INTRAMUSCULAR | 0 refills | Status: DC
  Filled 2023-11-21: qty 10, 56d supply, fill #0

## 2023-11-21 MED ORDER — LUBIPROSTONE 24 MCG PO CAPS
24.0000 ug | ORAL_CAPSULE | Freq: Every day | ORAL | 0 refills | Status: DC
  Filled 2023-11-21: qty 30, 30d supply, fill #0

## 2023-11-21 MED ORDER — ONDANSETRON HCL 4 MG PO TABS
4.0000 mg | ORAL_TABLET | Freq: Four times a day (QID) | ORAL | 0 refills | Status: DC | PRN
  Filled 2023-11-21: qty 18, 5d supply, fill #0

## 2023-11-21 MED ORDER — VITAMIN D 25 MCG (1000 UNIT) PO TABS
5000.0000 [IU] | ORAL_TABLET | Freq: Every day | ORAL | 0 refills | Status: DC
  Filled 2023-11-21: qty 150, 30d supply, fill #0

## 2023-11-21 MED ORDER — SALINE SPRAY 0.65 % NA SOLN
1.0000 | NASAL | 0 refills | Status: DC | PRN
  Filled 2023-11-21: qty 60, 30d supply, fill #0

## 2023-11-21 MED ORDER — PYRIDOXINE HCL 100 MG PO TABS
100.0000 mg | ORAL_TABLET | Freq: Every day | ORAL | 0 refills | Status: DC
  Filled 2023-11-21: qty 100, 100d supply, fill #0

## 2023-11-21 MED ORDER — PANTOPRAZOLE SODIUM 40 MG PO TBEC: 40.0000 mg | DELAYED_RELEASE_TABLET | Freq: Every day | ORAL | 0 refills | Status: DC

## 2023-11-21 MED ORDER — ATORVASTATIN CALCIUM 80 MG PO TABS
80.0000 mg | ORAL_TABLET | Freq: Every day | ORAL | 0 refills | Status: DC
  Filled 2023-11-21: qty 30, 30d supply, fill #0

## 2023-11-21 MED ORDER — LANTUS SOLOSTAR 100 UNIT/ML ~~LOC~~ SOPN
12.0000 [IU] | PEN_INJECTOR | Freq: Every day | SUBCUTANEOUS | 0 refills | Status: DC
Start: 2023-11-21 — End: 2024-01-06
  Filled 2023-11-21: qty 15, 125d supply, fill #0

## 2023-11-21 MED ORDER — NITROGLYCERIN 0.4 MG SL SUBL
0.4000 mg | SUBLINGUAL_TABLET | SUBLINGUAL | 0 refills | Status: DC | PRN
  Filled 2023-11-21: qty 25, 1d supply, fill #0

## 2023-11-21 MED ORDER — FERROUS SULFATE 325 (65 FE) MG PO TABS
325.0000 mg | ORAL_TABLET | Freq: Every day | ORAL | 0 refills | Status: DC
  Filled 2023-11-21: qty 100, 100d supply, fill #0

## 2023-11-21 MED ORDER — ADULT MULTIVITAMIN W/MINERALS CH: 1.0000 | ORAL_TABLET | Freq: Every day | ORAL | 0 refills | Status: DC

## 2023-11-21 MED ORDER — SERTRALINE HCL 25 MG PO TABS
25.0000 mg | ORAL_TABLET | Freq: Every day | ORAL | 0 refills | Status: DC
  Filled 2023-11-21: qty 30, 30d supply, fill #0

## 2023-11-21 MED ORDER — CERTAVITE SENIOR PO TABS
1.0000 | ORAL_TABLET | Freq: Every day | ORAL | 0 refills | Status: DC
  Filled 2023-11-21: qty 90, 90d supply, fill #0

## 2023-11-21 MED ORDER — MELATONIN 3 MG PO TABS
3.0000 mg | ORAL_TABLET | Freq: Every day | ORAL | 0 refills | Status: DC
  Filled 2023-11-21: qty 30, 30d supply, fill #0

## 2023-11-21 NOTE — TOC Progression Note (Addendum)
 Transition of Care [facility]) - Progression Note    Patient Details  Name: David [name] MRN: [id number] Date of Birth: Mar 21, 1946  Transition of Care [facility]) CM/SW Contact  Tawni CHRISTELLA Eva, LCSW Phone Number: 11/21/2023, 2:52 PM  Clinical Narrative:     Received message that the patient received his medications for discharge. Per chart review, the patient's cousin is not willing to meet him at the home upon arrival. CSW attempted to contact the patient's spouse several times but received no answer. A voicemail was left requesting a return call.  CSW spoke with the patient's daughter, Sheffield, who resides in [place] . She stated that the patient's wife should be at home but could not confirm this, as she does not have a phone. CSW inquired whether the pt's cousin would still be meeting him at the home, as per a note from yesterday indicating she had informed the RN that she would not be present and is currently in [place] . The pt's daughter attempted to contact the cousin but did not receive an answer. CSW also asked if the patient has running water in the home, as there was a previous mention of a possible issue. The daughter stated she was not aware of any problems with the water. The daughter reported that the pt can return home but requested that he have his phone with him so he can call for help if needed. CSW explained that EMS is unlikely to assist in locating the pt's phone but agreed to make a note of the request.  2:40pm CSW staffed the pt's case with the on-call [facility] supervisor, Delphine, who suggested conducting a welfare check at the home. CSW called [employer] to request the welfare check and is currently awaiting a call back.TOC to follow.   3:00pm CSW met with the pt at the bedside. The pt was oriented x3. The pt reported that he does not have his house keys with him and that his wife has them. The pt then expressed concerns about not having access to a phone when he returns home. He  believes his phone is somewhere in the home but it is dead. CSW explained that his wife will need to assist him in finding the phone, as EMS will only place the pt in the home and not search for items.  4:00pm CSW received a callback from [facility] Police [employer]. She reported that the pt's wife is at the home but appeared very confused, making statements such as, Oh, I have to pick him up from school today. Officer Battle placed the phone on speaker for CSW to speak with the pt's wife.The pt's wife stated that she would be on her way to pick up the pt. CSW explained to her that she would need assistance picking up the pt, as he requires maximum assistance and a Hoyer lift for transportation. CSW informed the pt's wife that the hospital can arrange EMS transport and asked if she could verify that there is a phone in the home. The pt's wife went to look for the phone and returned to the door stating they need to get new phones. CSW discussed the case with the on-call [facility] supervisor, Delphine. Per Delphine, due to the pt's wife's confusion and CSW's inability to verify a phone in the home, the pt is unable to be discharged today. TOC to follow.           Expected Discharge Plan and Services In-house Referral: NA Discharge Planning Services: CM Consult Post Acute  Care Choice: NA Living arrangements for the past 2 months: Skilled Nursing Facility Expected Discharge Date: 11/19/23               DME Arranged: Hospital bed, Other see comment, Oxygen [facility]ift) DME Agency: [employer] Date DME Agency Contacted: 11/19/23 Time DME Agency Contacted: 1505 Representative spoke with at DME Agency: [facility] Arranged: RN, PT, OT, Nurse's Aide, Social Work [employer] Agency: [facility] Date HH Agency Contacted: 11/18/23 Time HH Agency Contacted: 0845 Representative spoke with at [facility] Agency: Donette   Social Determinants of Health (SDOH) Interventions SDOH Screenings    Food Insecurity: No Food Insecurity (10/30/2023)  Housing: Low Risk  (10/30/2023)  Transportation Needs: No Transportation Needs (10/30/2023)  Utilities: Not At Risk (10/30/2023)  Alcohol Screen: Low Risk  (05/18/2023)  Financial Resource Strain: Low Risk  (05/18/2023)  Physical Activity: Inactive (08/20/2017)  Social Connections: Socially Integrated (10/30/2023)  Stress: No Stress Concern Present (08/20/2017)  Tobacco Use: Medium Risk (11/07/2023)    Readmission Risk Interventions    11/09/2023   11:30 AM 11/06/2023    8:44 AM  Readmission Risk Prevention Plan  Transportation Screening Complete Complete  Medication Review (RN Care Manager) Complete Complete  PCP or Specialist appointment within 3-5 days of discharge Complete   HRI or Home Care Consult Complete Complete  SW Recovery Care/Counseling Consult Complete Complete  Palliative Care Screening Not Applicable Not Applicable  Skilled Nursing Facility Complete Not Applicable

## 2023-11-21 NOTE — Progress Notes (Signed)
  Progress Note   Patient: David [name] FMW:[id number] DOB: Jan 14, 1946 DOA: 10/29/2023     22 DOS: the patient was seen and examined on 11/21/2023   No Charge Note: Patient was discharged each of the last 2 days and has no acute inpatient needs.  Discharge on 7/3 was delayed when patient finally agreed to hospital bed and hoyer lift at home late in the day and this could not be arranged.  Discharge on 7/4 was delayed (despite delivery of hospital bed) because at time of dc patient reported having no access to any home medications and no access to obtain from a pharmacy.  Home medications have all been ordered for Meds to Bed delivery from [facility] pharmacy and patient remains stable for PTAR transport to home once they are delivered.    Author: Delon Herald, MD 11/21/2023 8:04 AM  For on call review [email or web address].

## 2023-11-22 DIAGNOSIS — I5043 Acute on chronic combined systolic (congestive) and diastolic (congestive) heart failure: Secondary | ICD-10-CM | POA: Diagnosis not present

## 2023-11-22 LAB — GLUCOSE, CAPILLARY
Glucose-Capillary: 126 mg/dL — ABNORMAL HIGH (ref 70–99)
Glucose-Capillary: 191 mg/dL — ABNORMAL HIGH (ref 70–99)
Glucose-Capillary: 173 mg/dL — ABNORMAL HIGH (ref 70–99)
Glucose-Capillary: 188 mg/dL — ABNORMAL HIGH (ref 70–99)

## 2023-11-22 MED ORDER — SALINE SPRAY 0.65 % NA SOLN
1.0000 | NASAL | Status: DC
  Administered 2023-11-22 – 2023-11-26 (×18): 1 via NASAL
  Filled 2023-11-22: qty 44

## 2023-11-22 NOTE — Progress Notes (Signed)
 Progress Note   Patient: David [name] FMW:[id number] DOB: June 15, 1945 DOA: 10/29/2023     23 DOS: the patient was seen and examined on 11/22/2023   Brief hospital course: 78yo with h/o CAD s/p stent, chronic combined CHF, stage 4 CKD, DM, and HTN who presented on 6/12 with SOB.  He was treated for acute on chronic CHF and effectively diuresed.  Disposition has been complicated, as he was recommended for SNF rehab but denied by insurance despite Peer-to-Peer.  He will be discharged to home with [facility] assistance.  Assessment and Plan:   Disposition - this is the reason why patient remains hospitalized Patient has been medically stable for dc for some time He was declined by insurance for SNF rehab since he has been a LTC SNF patient He owes money to [facility] and cannot return there If unable to afford LTC, he will need to go home with home health support Per [facility] team notes, the plan will be home with [facility] with hospital bed and [facility] lift Home health services have been maximized but there remain concerns about home safety - his wife appears to have confusion issues, there is hoarding behavior in the home, there is a question of whether the water works in the home Initially discharged by me on 7/3 but he had previously declined hospital bed and Deitra and agreed to these items too late for them to be delivered Discharged again on 7/4 after delivery of bed and lift but then did not have access to medications since it was a holiday Unable to be discharged again on 7/5 due to safety concerns (see TOC note) Patient may become LLOS due to safety concerns at home He remains medically stable for discharge when he has a safe disposition  Acute on chronic combined CHF, acute hypoxic respiratory failure Imaging on admission of the CT of the chest did not show any PE but it did show CHF with pulmonary edema and bilateral pleural effusions Has been diuresed and now placed on torsemide  with  improvement   He is net -5.1 L, now reports feeling much better and close to baseline Continue Bidil    CAP Possible pneumonia overlay RSV, Covid, flu all negative Status post 5 days of ceftriaxone /azithromycin  Respiratory status stable C/o nasal drip - will change O2 to humidified with nasal saline prn   Troponin elevation Flat, not in a pattern consistent with ACS Likely demand ischemia in the setting of CHF   Epigastric discomfort /intermittent anxiety Improved after Reglan  Can be discharged on Reglan  PRN, Zoloft , Bentyl  as well Continue good bowel regimen   Macrocytic anemia  Hemoglobin dipped to 6.0 on 6/18, received 1 unit of packed red blood cells GI consulted and performed EGD which showed nonbleeding gastric ulcer, mucosal changes in the duodenal, and biopsies showed chronic active gastritis with ulcer and purulent exudates, iron  deposits suggestive of iron  pill gastritis Colonoscopy was offered but he declined   Essential hypertension Continue Bidil  Hold amlodipine  and Toprol  XL   Acute kidney injury on CKD stage IV  Baseline creatinine between 2 and 3, it was as high as 3.58 on 6/18 Improved back to baseline  Avoid nephrotoxic medications   CAD s/p stent  No chest discomfort Continue atorvastatin    DMII  Hemoglobin A1c 6.0, good control Continue Lantus    Chronic diabetic foot ulcer b/l  Per wound care Unstageable Pressure Injury right and left heel; left and right lateral feet'; all are 100% eschar  Arterial ulceration left medial  great toe; 100% clean Pressure Injury POA: Yes Cleanse all foot wounds with saline pat dry Paint all foot wounds with betadine, allow to air dry. Ok to cover with foam. Reapply betadine daily.  Offload heels at all times with prevalon boots Single layer of xeroform to the great toe wound, top with dry dressing or bandaid. Change daily Patient was supposed to follow-up with vascular surgery but did not after last  hospitalization Treated with doxycycline  for 10 days ABI normal on the right and moderate left lower extremity arterial disease.   Will need follow-up with vascular surgery as outpatient           Consultants: GI PT OT TOC team   Procedures: Echocardiogram 6/16 ABI 6/16 EGD 6/21   Antibiotics: Azithromycin  x 1 Ceftriaxone  x 6 doses  30 Day Unplanned Readmission Risk Score    Flowsheet Row ED to Hosp-Admission (Current) from 10/29/2023 in [facility]  30 Day Unplanned Readmission Risk Score (%) 47.8 Filed at 11/22/2023 0801    This score is the patient's risk of an unplanned readmission within 30 days of being discharged (0 -100%). The score is based on dignosis, age, lab data, medications, orders, and past utilization.   Low:  0-14.9   Medium: 15-21.9   High: 22-29.9   Extreme: 30 and above           Subjective: Long discussion this AM.  If he is unwilling/unable to pay for LTC at a facility out of pocket, he most likely needs to hire a full time caregiver at home since his wife is apparently unable and there is no other family available.   Objective: Vitals:   11/21/23 2003 11/22/23 0558  BP: (!) 141/68 (!) 154/70  Pulse: (!) 52 (!) 53  Resp: 18 16  Temp: 97.8 F (36.6 C) 97.8 F (36.6 C)  SpO2: 100% 100%    Intake/Output Summary (Last 24 hours) at 11/22/2023 1028 Last data filed at 11/22/2023 0856 Gross per 24 hour  Intake 240 ml  Output 800 ml  Net -560 ml   Filed Weights   11/18/23 1043 11/19/23 0821 11/20/23 0500  Weight: 82.4 kg 83.6 kg 83 kg    Exam:  General:  Appears calm and comfortable and is in NAD, chronically very debilitated Eyes:  R ptosis (chronic) ENT:  grossly normal hearing, lips & tongue, mmm Cardiovascular:  RRR, no m/r/g. No LE edema.  Respiratory:   CTA bilaterally with no wheezes/rales/rhonchi.  Normal respiratory effort. Abdomen:  soft, NT, ND Skin:  no rash or induration seen on  limited exam Musculoskeletal:  generalized weakness with developing contractures; BLE are in Prevalon boots Psychiatric:  blunted mood and affect, speech fluent and appropriate, AOx3 Neurologic:  R facial droop, generalized weakness  Data Reviewed: I have reviewed the patient's lab results since admission.  Pertinent labs for today include:   None     Family Communication: None present  Disposition: Status is: Inpatient Remains inpatient appropriate because: unsafe disposition     Time spent: 35 minutes  Unresulted Labs (From admission, onward)    None        Author: Delon Herald, MD 11/22/2023 10:28 AM  For on call review [email or web address].

## 2023-11-22 NOTE — Plan of Care (Signed)
  Problem: Education: Goal: Ability to describe self-care measures that may prevent or decrease complications (Diabetes Survival Skills Education) will improve Outcome: Progressing   Problem: Coping: Goal: Ability to adjust to condition or change in health will improve Outcome: Progressing   Problem: Nutritional: Goal: Maintenance of adequate nutrition will improve Outcome: Progressing   Problem: Skin Integrity: Goal: Risk for impaired skin integrity will decrease Outcome: Progressing   

## 2023-11-22 NOTE — Plan of Care (Signed)
  Problem: Coping: Goal: Ability to adjust to condition or change in health will improve Outcome: Progressing   Problem: Metabolic: Goal: Ability to maintain appropriate glucose levels will improve Outcome: Progressing   Problem: Skin Integrity: Goal: Risk for impaired skin integrity will decrease Outcome: Progressing   Problem: Tissue Perfusion: Goal: Adequacy of tissue perfusion will improve Outcome: Progressing   Problem: Clinical Measurements: Goal: Ability to maintain a body temperature in the normal range will improve Outcome: Progressing   Problem: Clinical Measurements: Goal: Ability to maintain clinical measurements within normal limits will improve Outcome: Progressing Goal: Diagnostic test results will improve Outcome: Progressing Goal: Respiratory complications will improve Outcome: Progressing

## 2023-11-23 DIAGNOSIS — I5043 Acute on chronic combined systolic (congestive) and diastolic (congestive) heart failure: Secondary | ICD-10-CM | POA: Diagnosis not present

## 2023-11-23 LAB — GLUCOSE, CAPILLARY: Glucose-Capillary: 167 mg/dL — ABNORMAL HIGH (ref 70–99)

## 2023-11-23 NOTE — Progress Notes (Signed)
 Physical Therapy Treatment Patient Details Name: David [name] MRN: [id number] DOB: 1945/07/30 Today's Date: 11/23/2023   History of Present Illness Pt is a 78 y.o. male admitted 10/29/23 for Acute on Chronic combined systolic/diastolic CHF exacerbation with associate acute hypoxic respiratory failure. Past medical history significant of CAD s/p stent, Combined systolic diastolic heart failure, CKD, GERD, HTN , anemia of chronic illness, DM II, neuropathy    PT Comments  Pt profoundly weak, unable to perform ankle pumps, needing assistance to complete supine hip abduction, supine hip adduction and supine heel slides. Pt reports my legs aren't listening to me, maximum education regarding mobility with therapy and nursing, purpose and benefit of therapeutic interventions, post acute care PT, but pt reports no success previously. Pt unaware he has been in hospital for 25 days, therapist attempting to encourage and educate pt on exercise performance daily, spending time OOB, but unsure of carryover. Pt maintains static sitting EOB for 9 minutes, therapist supporting with sheet around back and under arms to cue forward weight shift and using to assist pt when reaching in all directions with single UE to prevent LOB, varying from close SUPV for ~10 sec to mod A for static sitting. Attempted to position pt with feet on floor at 90/90 to increase BOS but pt prefers BLE slightly extended for comfort. Pt with strong weight shift R and posterior, reports pain at L buttock. Sacral patch intact, but pt tender to palpation over foam on L side and RN notified. Pt appears to fall asleep with rest breaks, easy to arouse and reengage in therapy.   If plan is discharge home, recommend the following: Two people to help with walking and/or transfers;A lot of help with bathing/dressing/bathroom;Assistance with cooking/housework;Assist for transportation   Can travel by private vehicle     No  Equipment  Recommendations  Hospital bed;Hoyer lift    Recommendations for Other Services       Precautions / Restrictions Precautions Precautions: Fall Recall of Precautions/Restrictions: Intact Precaution/Restrictions Comments: pt reports 2L O2 Hawkins baseline as needed, wears left knee sleeve/brace, Prevalon boots for bil heel pressure injuries plus Neuropathy all extremities.  Wearing welders gloves to keep his hands warm. Vision deficits.     Mobility  Bed Mobility Overal bed mobility: Needs Assistance Bed Mobility: Rolling, Supine to Sit, Sit to Supine Rolling: Mod assist, Used rails   Supine to sit: Max assist, +2 for physical assistance, HOB elevated, Used rails Sit to supine: Max assist, +2 for physical assistance   General bed mobility comments: mod A with bedrail to roll to side; max A+2 for supine<>sit, pt inches BLE ~3 inches towards EOB and pulls on bedrails with verbal cues but ultimately needs max A+2 for trunk and BLE management for powering up to siting and return to supine    Transfers                        Ambulation/Gait                   Stairs             Wheelchair Mobility     Tilt Bed    Modified Rankin (Stroke Patients Only)       Balance Overall balance assessment: Needs assistance Sitting-balance support: Bilateral upper extremity supported, Feet supported Sitting balance-Leahy Scale: Poor Sitting balance - Comments: lateral lean to R and posterior, constant multimodal cues to improve orientation back to upright and in  midline, though drifts back to R lateral lean and posteior lean; reports pain on L buttock causing trunk lean butunable to perform single UE reaching without min A to support to prevent LOB Postural control: Right lateral lean, Posterior lean                                  Communication Communication Communication: No apparent difficulties  Cognition Arousal: Alert Behavior During Therapy:  Flat affect   PT - Cognitive impairments: No apparent impairments                       PT - Cognition Comments: pt flat, reports being in hospital for 2 weeks and states wow, really? when therapist reports it has been 25 days, appears to fall asleep with rest breaks but able to be aroused and participate Following commands: Intact      Cueing Cueing Techniques: Verbal cues, Tactile cues, Visual cues  Exercises General Exercises - Lower Extremity Ankle Circles/Pumps:  (unable to perform) Heel Slides: AAROM, Both, 10 reps Hip ABduction/ADduction: AAROM, Both, 10 reps    General Comments        Pertinent Vitals/Pain Pain Assessment Pain Assessment: Faces Faces Pain Scale: Hurts whole lot Pain Location: L buttock Pain Descriptors / Indicators: Sore, Discomfort, Grimacing, Guarding Pain Intervention(s): Limited activity within patient's tolerance, Monitored during session, Repositioned    Home Living                          Prior Function            PT Goals (current goals can now be found in the care plan section) Acute Rehab PT Goals PT Goal Formulation: With patient Time For Goal Achievement: 12/06/23 Potential to Achieve Goals: Poor Progress towards PT goals: Not progressing toward goals - comment (profoundly weak)    Frequency    Min 2X/week      PT Plan      Co-evaluation              AM-PAC PT 6 Clicks Mobility   Outcome Measure  Help needed turning from your back to your side while in a flat bed without using bedrails?: A Lot Help needed moving from lying on your back to sitting on the side of a flat bed without using bedrails?: Total Help needed moving to and from a bed to a chair (including a wheelchair)?: Total Help needed standing up from a chair using your arms (e.g., wheelchair or bedside chair)?: Total Help needed to walk in hospital room?: Total Help needed climbing 3-5 steps with a railing? : Total 6 Click  Score: 7    End of Session Equipment Utilized During Treatment: Oxygen Activity Tolerance: Patient tolerated treatment well;Patient limited by pain Patient left: in bed;with call bell/phone within reach;with bed alarm set Nurse Communication: Mobility status;Other (comment) (L buttock pain) PT Visit Diagnosis: Muscle weakness (generalized) (M62.81);Other abnormalities of gait and mobility (R26.89)     Time: 8895-8868 PT Time Calculation (min) (ACUTE ONLY): 27 min  Charges:    $Therapeutic Exercise: 8-22 mins $Therapeutic Activity: 8-22 mins PT General Charges $$ ACUTE PT VISIT: 1 Visit                      Tori Riti Rollyson PT, DPT 11/23/23, 11:46 AM

## 2023-11-23 NOTE — Progress Notes (Signed)
 Progress Note   Patient: David [name] FMW:[id number] DOB: [birth year] DOA: 10/29/2023     24 DOS: the patient was seen and examined on 11/23/2023   Brief hospital course: 78yo with h/o CAD s/p stent, chronic combined CHF, stage 4 CKD, DM, and HTN who presented on 6/12 with SOB.  He was treated for acute on chronic CHF and effectively diuresed.  Disposition has been complicated, as he was recommended for SNF rehab but denied by insurance despite Peer-to-Peer.  He will be discharged to home with [facility] assistance.  Assessment and Plan:  Disposition - this is the reason why patient remains hospitalized Patient has been medically stable for dc for some time He was declined by insurance for SNF rehab since he has been a LTC SNF patient He owes money to [facility] and cannot return there If unable to afford LTC, he will need to go home with home health support Per [facility] team notes, the plan will be home with [facility] with hospital bed and [facility] lift Home health services have been maximized but there remain concerns about home safety - his wife appears to have confusion issues, there is hoarding behavior in the home, there is a question of whether the water works in the home Initially discharged by me on 7/3 but he had previously declined hospital bed and Deitra and agreed to these items too late for them to be delivered Discharged again on 7/4 after delivery of bed and lift but then did not have access to medications since it was a holiday Unable to be discharged again on 7/5 due to safety concerns (see TOC note) Patient may become LLOS due to safety concerns at home His wife is currently hospitalized (7/7) and so this again creates an unsafe disposition since he requires total care He remains medically stable for discharge when he has a safe disposition   Acute on chronic combined CHF, acute hypoxic respiratory failure Imaging on admission of the CT of the chest did not show any PE but it did  show CHF with pulmonary edema and bilateral pleural effusions Has been diuresed and now placed on torsemide  with improvement   He is net -5.1 L, now reports feeling much better and close to baseline Continue Bidil    CAP Possible pneumonia overlay RSV, Covid, flu all negative Status post 5 days of ceftriaxone /azithromycin  Respiratory status stable C/o nasal drip - changed O2 to humidified with nasal saline prn   Troponin elevation Flat, not in a pattern consistent with ACS Likely demand ischemia in the setting of CHF   Epigastric discomfort /intermittent anxiety Improved after Reglan  Can be discharged on Reglan  PRN, Zoloft , Bentyl  as well Continue good bowel regimen   Macrocytic anemia  Hemoglobin dipped to 6.0 on 6/18, received 1 unit of packed red blood cells GI consulted and performed EGD which showed nonbleeding gastric ulcer, mucosal changes in the duodenal, and biopsies showed chronic active gastritis with ulcer and purulent exudates, iron  deposits suggestive of iron  pill gastritis Colonoscopy was offered but he declined   Essential hypertension Continue Bidil  Hold amlodipine  and Toprol  XL   Acute kidney injury on CKD stage IV  Baseline creatinine between 2 and 3, it was as high as 3.58 on 6/18 Improved back to baseline  Avoid nephrotoxic medications   CAD s/p stent  No chest discomfort Continue atorvastatin    DMII  Hemoglobin A1c 6.0, good control Continue Lantus    Chronic diabetic foot ulcer b/l  Per wound care Unstageable Pressure Injury  right and left heel; left and right lateral feet'; all are 100% eschar  Arterial ulceration left medial great toe; 100% clean Pressure Injury POA: Yes Cleanse all foot wounds with saline pat dry Paint all foot wounds with betadine, allow to air dry. Ok to cover with foam. Reapply betadine daily.  Offload heels at all times with prevalon boots Single layer of xeroform to the great toe wound, top with dry dressing or bandaid.  Change daily Patient was supposed to follow-up with vascular surgery but did not after last hospitalization Treated with doxycycline  for 10 days ABI normal on the right and moderate left lower extremity arterial disease.   Will need follow-up with vascular surgery as outpatient           Consultants: GI PT OT TOC team   Procedures: Echocardiogram 6/16 ABI 6/16 EGD 6/21   Antibiotics: Azithromycin  x 1 Ceftriaxone  x 6 doses   30 Day Unplanned Readmission Risk Score    Flowsheet Row ED to Hosp-Admission (Current) from 10/29/2023 in [facility]  30 Day Unplanned Readmission Risk Score (%) 36.2 Filed at 11/23/2023 0801    This score is the patient's risk of an unplanned readmission within 30 days of being discharged (0 -100%). The score is based on dignosis, age, lab data, medications, orders, and past utilization.   Low:  0-14.9   Medium: 15-21.9   High: 22-29.9   Extreme: 30 and above           Subjective: Feels fatigued.  No other new complaints.  RN reported that patient was refusing medications this AM.   Objective: Vitals:   11/22/23 1211 11/22/23 2027  BP: 137/65 139/69  Pulse: (!) 55 (!) 53  Resp: 20 17  Temp: 98.4 F (36.9 C) 98.1 F (36.7 C)  SpO2: 99% 100%    Intake/Output Summary (Last 24 hours) at 11/23/2023 0805 Last data filed at 11/22/2023 1835 Gross per 24 hour  Intake 840 ml  Output 1125 ml  Net -285 ml   Filed Weights   11/18/23 1043 11/19/23 0821 11/20/23 0500  Weight: 82.4 kg 83.6 kg 83 kg    Exam:  General:  Appears calm and comfortable and is in NAD, chronically very debilitated Eyes:  R ptosis (chronic) ENT:  grossly normal hearing, lips & tongue, mmm Cardiovascular:  RRR, no m/r/g. No LE edema.  Respiratory:   CTA bilaterally with no wheezes/rales/rhonchi.  Normal respiratory effort. Abdomen:  soft, NT, ND Skin:  no rash or induration seen on limited exam Musculoskeletal:  generalized  weakness with developing contractures; BLE are in Prevalon boots Psychiatric:  blunted mood and affect, speech fluent and appropriate, AOx3 Neurologic:  R facial droop, generalized weakness  Data Reviewed: I have reviewed the patient's lab results since admission.  Pertinent labs for today include:   None     Family Communication: None present  Disposition: Status is: Inpatient Remains inpatient appropriate because: unsafe disposition     Time spent: 25 minutes  Unresulted Labs (From admission, onward)    None        Author: Delon Herald, MD 11/23/2023 8:05 AM  For on call review [email or web address].

## 2023-11-23 NOTE — Care Management Important Message (Signed)
 Important Message  Patient Details IM Letter given. Name: BENITO LEMMERMAN MRN: [id number] Date of Birth: May 09, 1946   Important Message Given:  Yes - Medicare IM     [name] 11/23/2023, 1:17 PM

## 2023-11-23 NOTE — Progress Notes (Signed)
 Heart Failure Navigator Progress Note  Assessed for Heart & Vascular TOC clinic readiness.  Patient does not meet criteria due to per MD note , patient is a total care patient. No HF TOC. .   Navigator will sign off at this time.   Stephane Haddock, BSN, Scientist, clinical (histocompatibility and immunogenetics) Only

## 2023-11-23 NOTE — Progress Notes (Signed)
 Chaplains received a consult to provide emotional support to David [name].  His main concern right now is that he has not been able to see his wife who is also hospitalized on a different unit in this hospital.  As she is medically stable right now, I worked with the nurses on both units to facilitate a visit between them.  He also feels like he does not have an understanding of what is going on with her health and what brings her to the hospital. I wrote to her providers to let them know where to locate him if there are updates that can be given to him.

## 2023-11-23 NOTE — TOC Progression Note (Signed)
 Transition of Care [facility]) - Progression Note    Patient Details  Name: David [name] MRN: [id number] Date of Birth: [birth year]  Transition of Care [facility]) CM/SW Contact  David Glenys DASEN, RN Phone Number: 11/23/2023, 12:50 PM  Clinical Narrative:    CM spoke with patient in the room. Patient is A&O x3. Informed patient of his choices for care. Patient states he is not willing/decline to pay for LTC. Discussed again with him that CM has set-up resources with [facility] for PT,OT,RN, aid and SW. Also [employer] for DME hospital bed, hoyer lift, and humidified oxygen. Recommended that patient speak with [facility] for a personal care aid and explained that would be an out of pocket expense. Patient states his personal cell phone is in his home in the pillow case on the bed.  Patient is aware that his wife David [name] is now in the hospital.   Expected Discharge Plan: Skilled Nursing Facility Barriers to Discharge: Barriers Resolved  Expected Discharge Plan and Services In-house Referral: NA Discharge Planning Services: CM Consult Post Acute Care Choice: NA Living arrangements for the past 2 months: Skilled Nursing Facility Expected Discharge Date: 11/19/23               DME Arranged: Hospital bed, Other see comment, Oxygen [facility] Lift) DME Agency: [employer] Date DME Agency Contacted: 11/19/23 Time DME Agency Contacted: 1505 Representative spoke with at DME Agency: [place] HH Arranged: RN, PT, OT, Nurse's Aide, Social Work [employer] Agency: [facility] ([facility]) Date HH Agency Contacted: 11/18/23 Time HH Agency Contacted: 0845 Representative spoke with at [facility] Agency: [place]   Social Determinants of Health (SDOH) Interventions SDOH Screenings   Food Insecurity: No Food Insecurity (10/30/2023)  Housing: Low Risk  (10/30/2023)  Transportation Needs: No Transportation Needs (10/30/2023)  Utilities: Not At Risk (10/30/2023)  Alcohol Screen: Low Risk  (05/18/2023)   Financial Resource Strain: Low Risk  (05/18/2023)  Physical Activity: Inactive (08/20/2017)  Social Connections: Socially Integrated (10/30/2023)  Stress: No Stress Concern Present (08/20/2017)  Tobacco Use: Medium Risk (11/07/2023)    Readmission Risk Interventions    11/09/2023   11:30 AM 11/06/2023    8:44 AM  Readmission Risk Prevention Plan  Transportation Screening Complete Complete  Medication Review (RN Care Manager) Complete Complete  PCP or Specialist appointment within 3-5 days of discharge Complete   HRI or Home Care Consult Complete Complete  SW Recovery Care/Counseling Consult Complete Complete  Palliative Care Screening Not Applicable Not Applicable  Skilled Nursing Facility Complete Not Applicable

## 2023-11-23 NOTE — Plan of Care (Signed)
  Problem: Education: Goal: Ability to describe self-care measures that may prevent or decrease complications (Diabetes Survival Skills Education) will improve Outcome: Adequate for Discharge Goal: Individualized Educational Video(s) Outcome: Adequate for Discharge   Problem: Coping: Goal: Ability to adjust to condition or change in health will improve Outcome: Adequate for Discharge   Problem: Fluid Volume: Goal: Ability to maintain a balanced intake and output will improve Outcome: Adequate for Discharge

## 2023-11-24 DIAGNOSIS — Z515 Encounter for palliative care: Secondary | ICD-10-CM

## 2023-11-24 DIAGNOSIS — Z7189 Other specified counseling: Secondary | ICD-10-CM | POA: Diagnosis not present

## 2023-11-24 DIAGNOSIS — R531 Weakness: Secondary | ICD-10-CM

## 2023-11-24 DIAGNOSIS — I5043 Acute on chronic combined systolic (congestive) and diastolic (congestive) heart failure: Secondary | ICD-10-CM | POA: Diagnosis not present

## 2023-11-24 LAB — GLUCOSE, CAPILLARY
Glucose-Capillary: 153 mg/dL — ABNORMAL HIGH (ref 70–99)
Glucose-Capillary: 204 mg/dL — ABNORMAL HIGH (ref 70–99)

## 2023-11-24 NOTE — [place] FL2 (Signed)
 [place]  MEDICAID FL2 LEVEL OF CARE FORM     IDENTIFICATION  Patient Name: David [name] Birthdate: January 13, 1946 Sex: male Admission Date (Current Location): 10/29/2023  [place] and [place] Number:  [name] [id number] Facility and Address:  [facility],  [address], [place] [address]      Provider Number: [id number]  Attending Physician Name and Address:  Barbarann Nest, MD  Relative Name and Phone Number:  David [name] [facility])  [phone]    Current Level of Care: Hospital Recommended Level of Care: Other (Comment) (Long Term Care) Prior Approval Number:    Date Approved/Denied:   PASRR Number: [id number]  Discharge Plan: Other (Comment) (Long Term Care)    Current Diagnoses: Patient Active Problem List   Diagnosis Date Noted   Troponin level elevated 11/18/2023   Symptomatic anemia 09/04/2023   Chronic combined systolic and diastolic congestive heart failure (HCC) 09/04/2023   Acute on chronic combined systolic and diastolic CHF (congestive heart failure) (HCC) 05/12/2023   Malnutrition of moderate degree 05/06/2023   Diabetic foot infection (HCC) 05/05/2023   Sacral wound 05/05/2023   Pleural effusion on right 05/05/2023   Elevated TSH 05/05/2023   Acute on chronic diastolic (congestive) heart failure (HCC) 05/05/2023   Diastolic CHF (HCC) 05/04/2023   Class 1 obesity 06/30/2022   Acute on chronic diastolic CHF (congestive heart failure) (HCC) 06/29/2022   PAD (peripheral artery disease) (HCC) 06/13/2022   Critical limb ischemia of left lower extremity (HCC) 03/21/2021   Nonhealing ulcer of left lower extremity limited to breakdown of skin (HCC) 03/21/2021   Abscess of left foot 08/03/2020   Osteomyelitis of fifth toe of left foot (HCC)    Mixed hyperlipidemia 10/15/2018   Bilateral leg edema 09/17/2018   Stage 3b chronic kidney disease (HCC) 09/10/2018   Anemia 09/10/2018   Pain and swelling of wrist, right 08/14/2018    Bilateral carotid artery stenosis 07/30/2018   Coronary artery disease involving native coronary artery of native heart without angina pectoris 07/30/2018   Snoring 02/11/2018   Chronic heart failure with preserved ejection fraction (HFpEF) (HCC) 12/22/2017   At risk for adverse drug reaction 12/15/2017   Chronic pain syndrome 12/15/2017   Status post coronary artery stent placement    Pneumonia due to infectious organism 03/30/2017   Type 2 diabetes mellitus with hyperlipidemia (HCC) 03/29/2017   Acute respiratory failure with hypoxemia (HCC) 03/29/2017   Acute on chronic respiratory failure with hypoxia (HCC)    Pulmonary congestion    Acquired contracture of Achilles tendon, right 08/19/2016   Amputated great toe, right (HCC) 07/18/2016   Onychomycosis 05/29/2016   Diabetic polyneuropathy associated with type 2 diabetes mellitus (HCC) 04/09/2016   Right foot ulcer, limited to breakdown of skin (HCC) 04/09/2016   Spinal stenosis of lumbar region without neurogenic claudication 05/24/2014   Hereditary and idiopathic peripheral neuropathy 09/15/2013   Malaise 08/14/2013   Essential hypertension 08/14/2013   Chronic back pain 08/14/2013   Glaucoma 08/14/2013   Headache 08/14/2013    Orientation RESPIRATION BLADDER Height & Weight     Self, Time, Situation, Place  Normal Incontinent Weight: 85 kg Height:  6' 2 (188 cm)  BEHAVIORAL SYMPTOMS/MOOD NEUROLOGICAL BOWEL NUTRITION STATUS      Continent Diet (Soft diabetic)  AMBULATORY STATUS COMMUNICATION OF NEEDS Skin   Extensive Assist Verbally PU Stage and Appropriate Care (Pressure Injury 05/12/23 Coccyx Mid Stage 2 - Partial thickness, Pressure Injury 05/12/23 Heel Left Deep Tissue Pressure Injury, and Wound / Incision (  Open or Dehisced) 05/23/23 Foot Anterior;Right;Upper)                       Personal Care Assistance Level of Assistance  Bathing, Feeding, Dressing Bathing Assistance: Maximum assistance Feeding assistance:  Limited assistance Dressing Assistance: Maximum assistance     Functional Limitations Info  Sight, Hearing, Speech Sight Info: Impaired Hearing Info: Adequate Speech Info: Adequate    SPECIAL CARE FACTORS FREQUENCY  PT (By licensed PT), OT (By licensed OT)     PT Frequency: 5x weekly OT Frequency: 5x weekly            Contractures Contractures Info: Not present    Additional Factors Info  Code Status, Allergies, Insulin  Sliding Scale Code Status Info: Full Allergies Info: Nsaids   Insulin  Sliding Scale Info: insulin  aspart (NOVOLOG ) 100 UNIT/ML injection Inject 0-6 Units into the skin 3 (three) times daily with meals  insulin  aspart (NOVOLOG ) 100 UNIT/ML injection Inject 0-6 Units into the skin 3 (three) times daily with meals.       Current Medications (11/24/2023):  This is the current hospital active medication list Current Facility-Administered Medications  Medication Dose Route Frequency Provider Last Rate Last Admin   acetaminophen  (TYLENOL ) tablet 650 mg  650 mg Oral Q6H PRN Rosalie Kitchens, MD       Or   acetaminophen  (TYLENOL ) suppository 650 mg  650 mg Rectal Q6H PRN Rosalie Kitchens, MD       albuterol  (PROVENTIL ) (2.5 MG/3ML) 0.083% nebulizer solution 2.5 mg  2.5 mg Nebulization Q2H PRN Rosalie Kitchens, MD       bisacodyl  (DULCOLAX) EC tablet 5 mg  5 mg Oral Daily PRN Rosalie Kitchens, MD   5 mg at 11/18/23 1235   bisacodyl  (DULCOLAX) suppository 10 mg  10 mg Rectal Once Magod, Marc, MD       dicyclomine  (BENTYL ) capsule 10 mg  10 mg Oral TID WC PRN Drusilla Sabas RAMAN, MD   10 mg at 11/10/23 1707   docusate sodium  (COLACE) capsule 100 mg  100 mg Oral Daily Rosalie Kitchens, MD   100 mg at 11/23/23 1045   feeding supplement (ENSURE PLUS HIGH PROTEIN) liquid 237 mL  237 mL Oral BID BM Rosalie Kitchens, MD   237 mL at 11/23/23 1046   guaiFENesin -dextromethorphan  (ROBITUSSIN DM) 100-10 MG/5ML syrup 5 mL  5 mL Oral Q4H PRN Rosalie Kitchens, MD       insulin  aspart (novoLOG ) injection 0-6 Units  0-6 Units  Subcutaneous TID WC Rosalie Kitchens, MD   1 Units at 11/22/23 1627   isosorbide -hydrALAZINE  (BIDIL ) 20-37.5 MG per tablet 2 tablet  2 tablet Oral TID Rosalie Kitchens, MD   2 tablet at 11/23/23 2042   latanoprost  (XALATAN ) 0.005 % ophthalmic solution 1 drop  1 drop Both Eyes QHS Rosalie Kitchens, MD   1 drop at 11/23/23 2042   metoCLOPramide  (REGLAN ) injection 5 mg  5 mg Intravenous Q6H Lama, Gagan S, MD   5 mg at 11/24/23 0234   ondansetron  (ZOFRAN ) injection 4 mg  4 mg Intravenous Q6H PRN Drusilla Sabas RAMAN, MD       pantoprazole  (PROTONIX ) EC tablet 40 mg  40 mg Oral Daily Rosalie Kitchens, MD   40 mg at 11/23/23 1045   sertraline  (ZOLOFT ) tablet 25 mg  25 mg Oral Daily Rosalie Kitchens, MD   25 mg at 11/23/23 1045   simethicone  (MYLICON) chewable tablet 80 mg  80 mg Oral QID PRN Rosalie Kitchens, MD  80 mg at 11/06/23 1940   sodium chloride  (OCEAN) 0.65 % nasal spray 1 spray  1 spray Each Nare Q2H Yates, Jennifer, MD   1 spray at 11/24/23 0234   torsemide  (DEMADEX ) tablet 20 mg  20 mg Oral Daily Lama, Gagan S, MD   20 mg at 11/23/23 1045   traMADol  (ULTRAM ) tablet 50-100 mg  50-100 mg Oral TID PRN Vann, Jessica U, DO   50 mg at 11/23/23 2135   traZODone  (DESYREL ) tablet 100 mg  100 mg Oral QHS Rosalie Kitchens, MD   100 mg at 11/23/23 2042     Discharge Medications: Please see discharge summary for a list of discharge medications.  Relevant Imaging Results:  Relevant Lab Results:   Additional Information SSN [id number]  Doneta Glenys DASEN, RN

## 2023-11-24 NOTE — Plan of Care (Signed)
  Problem: Nutritional: Goal: Maintenance of adequate nutrition will improve Outcome: Progressing   Problem: Coping: Goal: Ability to adjust to condition or change in health will improve Outcome: Progressing

## 2023-11-24 NOTE — Progress Notes (Signed)
 Progress Note   Patient: David [name] FMW:[id number] DOB: 1946/02/17 DOA: 10/29/2023     25 DOS: the patient was seen and examined on 11/24/2023   Brief hospital course: 78yo with h/o CAD s/p stent, chronic combined CHF, stage 4 CKD, DM, and HTN who presented on 6/12 with SOB.  He was treated for acute on chronic CHF and effectively diuresed.  Disposition has been complicated, but he was finally recognized as having LTC Medicaid and so should be able to dc to a facility soon.  He is medically stable.  Assessment and Plan:  Disposition - this is the reason why patient remains hospitalized Patient has been medically stable for dc for some time He was declined by insurance for SNF rehab since he has been a LTC SNF patient He owes money to [facility] and cannot return there If unable to afford LTC, he will need to go home with home health support Per [facility] team notes, the plan will be home with [facility] with hospital bed and [facility] lift Home health services have been maximized but there remain concerns about home safety - his wife appears to have confusion issues, there is hoarding behavior in the home Initially discharged by me on 7/3 but he had previously declined hospital bed and [facility] and agreed to these items too late for them to be delivered Discharged again on 7/4 after delivery of bed and lift but then did not have access to medications since it was a holiday Unable to be discharged again on 7/5 due to safety concerns (see TOC note) His wife is currently hospitalized (7/7) and so this again creates an unsafe disposition since he requires total care He remains medically stable for discharge when he has a safe disposition He was discovered to have been approved for LTC Medicaid several months ago on 7/8 (patient was unaware) and so now hopefully can be placed in a LTC facility   Acute on chronic combined CHF, acute hypoxic respiratory failure Imaging on admission of the CT of the  chest did not show any PE but it did show CHF with pulmonary edema and bilateral pleural effusions Has been diuresed and now placed on torsemide  with improvement   He is net -5.1 L, now reports feeling much better and close to baseline Continue Bidil    CAP Possible pneumonia overlay RSV, Covid, flu all negative Status post 5 days of ceftriaxone /azithromycin  Respiratory status stable C/o nasal drip - changed O2 to humidified with nasal saline prn   Troponin elevation Flat, not in a pattern consistent with ACS Likely demand ischemia in the setting of CHF   Epigastric discomfort /intermittent anxiety Improved after Reglan  Can be discharged on Reglan  PRN, Zoloft , Bentyl  as well Continue good bowel regimen   Macrocytic anemia  Hemoglobin dipped to 6.0 on 6/18, received 1 unit of packed red blood cells GI consulted and performed EGD which showed nonbleeding gastric ulcer, mucosal changes in the duodenal, and biopsies showed chronic active gastritis with ulcer and purulent exudates, iron  deposits suggestive of iron  pill gastritis Colonoscopy was offered but he declined   Essential hypertension Continue Bidil  Hold amlodipine  and Toprol  XL   Acute kidney injury on CKD stage IV  Baseline creatinine between 2 and 3, it was as high as 3.58 on 6/18 Improved back to baseline  Avoid nephrotoxic medications   CAD s/p stent  No chest discomfort Continue atorvastatin    DMII  Hemoglobin A1c 6.0, good control Continue Lantus    Chronic diabetic foot  ulcer b/l  Per wound care Unstageable Pressure Injury right and left heel; left and right lateral feet'; all are 100% eschar  Arterial ulceration left medial great toe; 100% clean Pressure Injury POA: Yes Cleanse all foot wounds with saline pat dry Paint all foot wounds with betadine, allow to air dry. Ok to cover with foam. Reapply betadine daily.  Offload heels at all times with prevalon boots Single layer of xeroform to the great toe  wound, top with dry dressing or bandaid. Change daily Patient was supposed to follow-up with vascular surgery but did not after last hospitalization Treated with doxycycline  for 10 days ABI normal on the right and moderate left lower extremity arterial disease.   Will need follow-up with vascular surgery as outpatient           Consultants: GI PT OT TOC team   Procedures: Echocardiogram 6/16 ABI 6/16 EGD 6/21   Antibiotics: Azithromycin  x 1 Ceftriaxone  x 6 doses  30 Day Unplanned Readmission Risk Score    Flowsheet Row ED to Hosp-Admission (Current) from 10/29/2023 in [facility]  30 Day Unplanned Readmission Risk Score (%) 36.6 Filed at 11/24/2023 0801    This score is the patient's risk of an unplanned readmission within 30 days of being discharged (0 -100%). The score is based on dignosis, age, lab data, medications, orders, and past utilization.   Low:  0-14.9   Medium: 15-21.9   High: 22-29.9   Extreme: 30 and above           Subjective: Feels well.  No concerns at this time.   Objective: Vitals:   11/23/23 2108 11/24/23 0459  BP: 125/67 (!) 163/78  Pulse: (!) 55 (!) 54  Resp: 18 18  Temp:  98.7 F (37.1 C)  SpO2:      Intake/Output Summary (Last 24 hours) at 11/24/2023 1439 Last data filed at 11/24/2023 0854 Gross per 24 hour  Intake 240 ml  Output 500 ml  Net -260 ml   Filed Weights   11/19/23 0821 11/20/23 0500 11/24/23 0500  Weight: 83.6 kg 83 kg 85 kg    Exam:  General:  Appears calm and comfortable and is in NAD, chronically very debilitated Eyes:  R ptosis (chronic) ENT:  grossly normal hearing, lips & tongue, mmm Cardiovascular:  RRR, no m/r/g. No LE edema.  Respiratory:   CTA bilaterally with no wheezes/rales/rhonchi.  Normal respiratory effort. Abdomen:  soft, NT, ND Skin:  no rash or induration seen on limited exam Musculoskeletal:  generalized weakness with developing contractures; BLE are  in Prevalon boots Psychiatric:  blunted mood and affect, speech fluent and appropriate, AOx3 Neurologic:  R facial droop, generalized weakness  Data Reviewed: I have reviewed the patient's lab results since admission.  Pertinent labs for today include:   None today     Family Communication: None present - wife is hospitalized  Disposition: Status is: Inpatient Remains inpatient appropriate because: awaiting placement     Time spent: 35 minutes  Unresulted Labs (From admission, onward)    None        Author: Delon Herald, MD 11/24/2023 2:39 PM  For on call review [email or web address].

## 2023-11-24 NOTE — Plan of Care (Signed)
  Problem: Metabolic: Goal: Ability to maintain appropriate glucose levels will improve Outcome: Progressing   Problem: Coping: Goal: Ability to adjust to condition or change in health will improve Outcome: Not Progressing   Problem: Fluid Volume: Goal: Ability to maintain a balanced intake and output will improve Outcome: Adequate for Discharge   Problem: Health Behavior/Discharge Planning: Goal: Ability to identify and utilize available resources and services will improve Outcome: Adequate for Discharge

## 2023-11-24 NOTE — TOC Progression Note (Addendum)
 Transition of Care [facility]) - Progression Note    Patient Details  Name: David [name] MRN: [id number] Date of Birth: 01-Jan-1946  Transition of Care [facility]) CM/SW Contact  Doneta Glenys DASEN, RN Phone Number: 11/24/2023, 9:30 AM  Clinical Narrative:    SW spoke with Mylinda Essex from [facility], states that patient has had Medicaid Long Term (ID# - [id number]) since 06/2023. CM called [facility] billing to have insurance information updated. SW called Madelin Jacobus to inquire about patients insurance and if patient is able to return to to [facility] for LTC. Will await response from Tammy. 12:29 PM Choice offered  Service Provider Request Status STAR(s) Address Phone   [facility] SNF  Accepted 3 [address], [place] [address] [phone]  [facility]  Accepted 1 [address] [place], [place] [address] [phone]  [facility] SN[facility]  Accepted 1 [address] [place] [place] [address] [phone]  [facility]  Accepted 1 [address] [place] [address] [phone]  3:15 PM Patient has chosen [facility]. Crystal will check insurance and call CM back tomorrow.   Expected Discharge Plan: Skilled Nursing Facility Barriers to Discharge: Barriers Resolved  Expected Discharge Plan and Services In-house Referral: NA Discharge Planning Services: CM Consult Post Acute Care Choice: NA Living arrangements for the past 2 months: Skilled Nursing Facility Expected Discharge Date: 11/19/23               DME Arranged: Hospital bed, Other see comment, Oxygen [facility]) DME Agency: [employer] Date DME Agency Contacted: 11/19/23 Time DME Agency Contacted: 1505 Representative spoke with at DME Agency: [place] HH Arranged: RN, PT, OT, Nurse's Aide, Social Work [employer] Agency: [facility] Date HH Agency Contacted: 11/18/23 Time HH Agency Contacted: 0845 Representative spoke with at [facility] Agency:  [place]   Social Determinants of Health (SDOH) Interventions SDOH Screenings   Food Insecurity: No Food Insecurity (10/30/2023)  Housing: Low Risk  (10/30/2023)  Transportation Needs: No Transportation Needs (10/30/2023)  Utilities: Not At Risk (10/30/2023)  Alcohol Screen: Low Risk  (05/18/2023)  Financial Resource Strain: Low Risk  (05/18/2023)  Physical Activity: Inactive (08/20/2017)  Social Connections: Socially Integrated (10/30/2023)  Stress: No Stress Concern Present (08/20/2017)  Tobacco Use: Medium Risk (11/07/2023)    Readmission Risk Interventions    11/09/2023   11:30 AM 11/06/2023    8:44 AM  Readmission Risk Prevention Plan  Transportation Screening Complete Complete  Medication Review (RN Care Manager) Complete Complete  PCP or Specialist appointment within 3-5 days of discharge Complete   HRI or Home Care Consult Complete Complete  SW Recovery Care/Counseling Consult Complete Complete  Palliative Care Screening Not Applicable Not Applicable  Skilled Nursing Facility Complete Not Applicable

## 2023-11-24 NOTE — Progress Notes (Signed)
 Physical Therapy Treatment Patient Details Name: David [name] MRN: [id number] DOB: [birth year] Today's Date: 11/24/2023   History of Present Illness Pt is a 78 y.o. male admitted 10/29/23 for Acute on Chronic combined systolic/diastolic CHF exacerbation with associate acute hypoxic respiratory failure. 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  Requires MAX encouragement.  Self limiting.  Wheelchair level prior.  Assisted to EOB was VERY difficult.  General bed mobility comments: Pt required + 2 Max/Total Assist Pt 10% from supine to sit present with severe posterior and RIGHT lean sitting EOB required Max Assist to prevent LOB.  Pt unable able to self correct.  EOB x 7 min to attempt forward lean as well as lateral midline posture.  Pt was unable to achieve a safe self sitting posture.  B LE remained extended.  Unable to achive 90/90 hip/knee flexion.  Unable to functionally use R UE to support self.  Required Total Assist + back to bed then position to comfort.  With POOR sitting ability, unable to attempt any sliding board/lateral scooting to recliner.  General transfer comment: wanted to attempt a lateral scoot however Pt lack the sitting balance needed.  Rec MAXI MOVE for all OOB transfers.    If plan is discharge home, recommend the following:     Can travel by private vehicle        Equipment Recommendations       Recommendations for Other Services       Precautions / Restrictions Precautions Precautions: Fall Precaution/Restrictions Comments: pt reports 2L O2 [employer] baseline as needed, wears left knee sleeve/brace, Prevalon boots for bil heel pressure injuries plus Neuropathy all extremities.  Wearing welders gloves to keep his hands warm. Vision  deficits. Restrictions Weight Bearing Restrictions Per Provider Order: No Other Position/Activity Restrictions: Non amb since Jan 25     Mobility  Bed Mobility Overal bed mobility: Needs Assistance       Supine to sit: Max assist, Total assist, +2 for physical assistance, +2 for safety/equipment Sit to supine: Total assist, +2 for physical assistance, +2 for safety/equipment   General bed mobility comments: Pt required + 2 Max/Total Assist Pt 10% from supine to sit present with severe posterior and RIGHT lean sitting EOB required Max Assist to prevent LOB.  Pt unable able to self correct.  EOB x 7 min to attempt forward lean as well as lateral midline posture.  Pt was unable to achieve a safe self sitting posture.  B LE remained extended.  Unable to achive 90/90 hip/knee flexion.  Unable to functionally use R UE to support self.  With POOR sitting ability, unable to attempt any sliding board/lateral scooting to recliner.  Rec MAXI MOVE LIFT.    Transfers                   General transfer comment: wanted to attempt a lateral scoot however Pt lack the sitting balance needed.  Rec MAXI MOVE.    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  Requires MAX encouragement.  Self limiting.  Wheelchair level prior. Following commands: Intact      Cueing Cueing Techniques: Verbal cues  Exercises      General Comments        Pertinent  Vitals/Pain Pain Assessment Pain Assessment: Faces Faces Pain Scale: Hurts even more Pain Location: buttocks with activity Pain Descriptors / Indicators: Grimacing Pain Intervention(s): Monitored during session, Repositioned     AM-PAC PT 6 Clicks Mobility   Outcome Measure                   End of Session  In bed with bed alarm active and call light within reach             Time: 1532-1550 PT Time Calculation (min) (ACUTE ONLY): 18 min  Charges:    $Therapeutic Activity: 8-22 mins PT General Charges $$ ACUTE PT VISIT: 1 Visit                     Katheryn Leap  PTA Acute  Rehabilitation Services Office M-F          [phone]

## 2023-11-24 NOTE — Progress Notes (Signed)
 Occupational Therapy Treatment Patient Details Name: David [name] MRN: [id number] DOB: March 13, 1946 Today's Date: 11/24/2023   History of present illness Pt is a 78 y.o. male admitted 10/29/23 for Acute on Chronic combined systolic/diastolic CHF exacerbation with associate acute hypoxic respiratory failure. Past medical history significant of CAD s/p stent, Combined systolic diastolic heart failure, CKD, GERD, HTN , anemia of chronic illness, DM II, neuropathy   OT comments  Pt in bed upon arrival. Pt declined sup - sit to EOB, agreeable to rolling and for HOB raised 60-75 degrees for self feeding at bed level. Provided pt with red foam built up grip and pt able to feed self with min A to initiate spoon in hand and to scoop applesauce form cup. Pt then able to hold cup in L hand and finish eating applesauce with Sup/SBA, no spillage. OT will continue to follow acutely to maximize level of function and safety      If plan is discharge home, recommend the following:  Two people to help with walking and/or transfers;Two people to help with bathing/dressing/bathroom;Assistance with cooking/housework;Assistance with feeding;Direct supervision/assist for medications management;Direct supervision/assist for financial management;Assist for transportation;Help with stairs or ramp for entrance   Equipment Recommendations  None recommended by OT    Recommendations for Other Services      Precautions / Restrictions Precautions Precautions: Fall Recall of Precautions/Restrictions: Intact Restrictions Weight Bearing Restrictions Per Provider Order: No       Mobility Bed Mobility Overal bed mobility: Needs Assistance Bed Mobility: Rolling Rolling: Mod assist, Used rails         General bed mobility comments: pt declined sup - sit to EOB, agreeable to [facility] raised 60-75 degrees for self feeding at bed level    Transfers                         Balance                                            ADL either performed or assessed with clinical judgement   ADL Overall ADL's : Needs assistance/impaired Eating/Feeding: Bed level;Minimal assistance;Supervision/ safety                                     General ADL Comments: provided pt with red foam built up grip and pt able to feed self with min A to initiate spoon in hand and to scoop applesauce form cup. Pt then able to hold cup in L hand and finish eating applesauce with Sup/SBA, no spillage    Extremity/Trunk Assessment Upper Extremity Assessment Upper Extremity Assessment: Generalized weakness;Right hand dominant RUE Deficits / Details: wears gloves bilaterally due to neuropathy   Lower Extremity Assessment Lower Extremity Assessment: Defer to PT evaluation        Vision Baseline Vision/History:  (R eye blindness) Ability to See in Adequate Light: 1 Impaired Patient Visual Report: No change from baseline     Perception     Praxis     Communication Communication Communication: No apparent difficulties   Cognition Arousal: Alert Behavior During Therapy: Flat affect  Following commands: Intact        Cueing   Cueing Techniques: Tactile cues, Verbal cues  Exercises      Shoulder Instructions       General Comments      Pertinent Vitals/ Pain       Pain Assessment Pain Assessment: Faces Faces Pain Scale: Hurts a little bit Pain Location: R hand when removing glove Pain Descriptors / Indicators: Grimacing Pain Intervention(s): Monitored during session  Home Living                                          Prior Functioning/Environment              Frequency  Min 2X/week        Progress Toward Goals  OT Goals(current goals can now be found in the care plan section)  Progress towards OT goals: OT to reassess next treatment     Plan      Co-evaluation                  AM-PAC OT 6 Clicks Daily Activity     Outcome Measure   Help from another person eating meals?: A Little Help from another person taking care of personal grooming?: A Lot Help from another person toileting, which includes using toliet, bedpan, or urinal?: Total Help from another person bathing (including washing, rinsing, drying)?: A Lot Help from another person to put on and taking off regular upper body clothing?: A Lot Help from another person to put on and taking off regular lower body clothing?: Total 6 Click Score: 11    End of Session    OT Visit Diagnosis: Other abnormalities of gait and mobility (R26.89);Muscle weakness (generalized) (M62.81);Pain Pain - Right/Left: Right Pain - part of body: Hand   Activity Tolerance Patient tolerated treatment well   Patient Left in bed;with call bell/phone within reach;with bed alarm set   Nurse Communication Mobility status        Time: 8791-8768 OT Time Calculation (min): 23 min  Charges: OT General Charges $OT Visit: 1 Visit OT Treatments $Self Care/Home Management : 8-22 mins    Jacques Karna Loose 11/24/2023, 3:02 PM

## 2023-11-24 NOTE — Consult Note (Signed)
 Consultation Note Date: 11/24/2023   Patient Name: David [name]  DOB: 08/21/1945  MRN: [id number]  Age / Sex: 78 y.o., male  PCP: Rexanne Ingle, MD Referring Physician: Barbarann Nest, MD  Reason for Consultation: Establishing goals of care  HPI/Patient Profile: 78 y.o. male admitted on 10/29/2023  78 year old gentleman, artery disease status post stent, chronic combined congestive heart failure, stage IV CKD diabetes hypertension Admitted for shortness of breath Treated under hospital medicine service for acute on chronic CHF Given diuresis TOC following closely Palliative consult for further goals of care discussions Palliative care consult request received Chart reviewed, patient seen and discussed with patient and wife was also present at bedside.  TOC colleague was also present in the room  Clinical Assessment and Goals of Care:  Palliative medicine is specialized medical care for people living with serious illness. It focuses on providing relief from the symptoms and stress of a serious illness. The goal is to improve quality of life for both the patient and the family. Goals of care: Broad aims of medical therapy in relation to the patient's values and preferences. Our aim is to provide medical care aimed at enabling patients to achieve the goals that matter most to them, given the circumstances of their particular medical situation and their constraints.    NEXT OF KIN  Wife present at bedside.   SUMMARY OF RECOMMENDATIONS   Goals of care discussions attempted to be undertaken.  Wife is also currently hospitalized however she is present at bedside.  The patient and wife state that they have been given good news of by [facility] colleague that the patient is a Medicaid eligible as well as he has multiple acceptances from nearby nursing facilities.  Discussed about scope of current hospitalization.   Goals wishes and values attempted to be explored.  Patient is very much thankful to [facility] colleague and is in a good mood.  He wishes for continued efforts at physical therapy and rehabilitation.  He hopes for some degree of meaningful stabilization/recovery.  Patient is also thankful for being supported by spiritual care in this hospitalization.  At present, full code.  To continue.  I have recommended palliative services at next skilled nursing rehab facility.  No further new inpatient palliative specific recommendations at this time.  Thank you for the consult.  Code Status/Advance Care Planning: Full code   Symptom Management:     Palliative Prophylaxis:  Delirium Protocol  Additional Recommendations (Limitations, Scope, Preferences): Full Scope Treatment  Psycho-social/Spiritual:  Desire for further Chaplaincy support:yes Additional Recommendations: Caregiving  Support/Resources  Prognosis:  Unable to determine  Discharge Planning: Skilled Nursing Facility for rehab with Palliative care service follow-up      Primary Diagnoses: Present on Admission:  Acute on chronic combined systolic and diastolic CHF (congestive heart failure) (HCC)  Pneumonia due to infectious organism  Troponin level elevated  Type 2 diabetes mellitus with hyperlipidemia (HCC)  Essential hypertension  Diabetic foot infection (HCC)   I have reviewed the medical record, interviewed the patient  and family, and examined the patient. The following aspects are pertinent.  Past Medical History:  Diagnosis Date   Anemia    Arthritis    CAD in native artery    s/p stent in 11/18   CHF (congestive heart failure) (HCC)    normal echo in 11/18   CKD (chronic kidney disease) stage 3, GFR 30-59 ml/min (HCC)    DDD (degenerative disc disease), cervical    Diabetes mellitus with complication (HCC)    Type II   Diabetic neuropathy (HCC)    Diabetic retinopathy (HCC)    GERD (gastroesophageal reflux disease)     Glaucoma    Hypertension    Social History   Socioeconomic History   Marital status: Married    Spouse name: [name]   Number of children: 2   Years of education: 16   Highest education level: Master's degree (e.g., MA, MS, MEng, MEd, MSW, MBA)  Occupational History   Occupation: Retired  Tobacco Use   Smoking status: Former    Current packs/day: 0.00    Average packs/day: 0.3 packs/day for 5.0 years (1.3 ttl pk-yrs)    Types: Cigarettes    Start date: 34    Quit date: 1996    Years since quitting: 29.5    Passive exposure: Past   Smokeless tobacco: Never  Vaping Use   Vaping status: Never Used  Substance and Sexual Activity   Alcohol use: Not Currently    Comment: occ   Drug use: No   Sexual activity: Yes  Other Topics Concern   Not on file  Social History Narrative   Not on file   Social Drivers of Health   Financial Resource Strain: Low Risk  (05/18/2023)   Overall Financial Resource Strain (CARDIA)    Difficulty of Paying Living Expenses: Not very hard  Food Insecurity: No Food Insecurity (10/30/2023)   Hunger Vital Sign    Worried About Running Out of Food in the Last Year: Never true    Ran Out of Food in the Last Year: Never true  Transportation Needs: No Transportation Needs (10/30/2023)   PRAPARE - Administrator, Civil Service (Medical): No    Lack of Transportation (Non-Medical): No  Physical Activity: Inactive (08/20/2017)   Exercise Vital Sign    Days of Exercise per Week: 0 days    Minutes of Exercise per Session: 0 min  Stress: No Stress Concern Present (08/20/2017)   [employer] of Occupational Health - Occupational Stress Questionnaire    Feeling of Stress : Not at all  Social Connections: Socially Integrated (10/30/2023)   Social Connection and Isolation Panel    Frequency of Communication with Friends and Family: Twice a week    Frequency of Social Gatherings with Friends and Family: Twice a week    Attends Religious  Services: More than 4 times per year    Active Member of [employer] or Organizations: No    Attends Engineer, structural: 1 to 4 times per year    Marital Status: Married   Family History  Problem Relation Age of Onset   Diabetes Other    Hyperlipidemia Other    Hypertension Other    Stroke Other    Alzheimer's disease Other    Thyroid  disease Mother    Diabetes Mellitus II Father    Alzheimer's disease Father    Scheduled Meds:  bisacodyl   10 mg Rectal Once   docusate sodium   100 mg Oral Daily  feeding supplement  237 mL Oral BID BM   insulin  aspart  0-6 Units Subcutaneous TID WC   isosorbide -hydrALAZINE   2 tablet Oral TID   latanoprost   1 drop Both Eyes QHS   metoCLOPramide  (REGLAN ) injection  5 mg Intravenous Q6H   pantoprazole   40 mg Oral Daily   sertraline   25 mg Oral Daily   sodium chloride   1 spray Each Nare Q2H   torsemide   20 mg Oral Daily   traZODone   100 mg Oral QHS   Continuous Infusions: PRN Meds:.acetaminophen  **OR** acetaminophen , albuterol , bisacodyl , dicyclomine , guaiFENesin -dextromethorphan , ondansetron  (ZOFRAN ) IV, simethicone , traMADol  Medications Prior to Admission:  Prior to Admission medications   Medication Sig Start Date End Date Taking? Authorizing Provider  acetaminophen  (TYLENOL ) 500 MG tablet Take 2 tablets (1,000 mg total) by mouth in the morning and at bedtime. 11/21/23   Barbarann Nest, MD  albuterol  (VENTOLIN  HFA) 108 [phone] Base) MCG/ACT inhaler Inhale 2 puffs into the lungs every 6 (six) hours as needed for wheezing or shortness of breath. 11/21/23   Barbarann Nest, MD  albuterol  (VENTOLIN  HFA) 108 (90 Base) MCG/ACT inhaler Inhale 2 puffs into the lungs every 6 (six) hours as needed for wheezing or shortness of breath.. 11/21/23   Barbarann Nest, MD  amLODipine  (NORVASC ) 10 MG tablet Take 10 mg by mouth daily. 05/29/23   [provider]  aspirin  EC 81 MG tablet Take 1 tablet (81 mg total) by mouth daily. 11/21/23   Barbarann Nest, MD   atorvastatin  (LIPITOR ) 80 MG tablet Take 1 tablet (80 mg total) by mouth at bedtime. 11/21/23   Barbarann Nest, MD  bisacodyl  (DULCOLAX) 10 MG suppository Place 1 suppository (10 mg total) rectally daily as needed for moderate constipation. 11/21/23   Barbarann Nest, MD  bismuth subsalicylate (PEPTO BISMOL) 262 MG/15ML suspension Take 30 mLs by mouth daily as needed for diarrhea or loose stools.    [provider]  cholecalciferol  (VITAMIN D3) 25 MCG (1000 UNIT) tablet Take 5 tablets (5,000 Units total) by mouth daily. 11/21/23   Barbarann Nest, MD  cyanocobalamin  (VITAMIN B12) 1000 MCG tablet Take 2 tablets (2,000 mcg total) by mouth daily. 11/21/23   Barbarann Nest, MD  dextromethorphan -guaiFENesin  (MUCINEX  DM) 30-600 MG 12hr tablet Take 1 tablet by mouth 2 (two) times daily. 11/21/23   Barbarann Nest, MD  dicyclomine  (BENTYL ) 10 MG capsule Take 1 capsule (10 mg total) by mouth in the morning, at noon, in the evening, and at bedtime. 11/21/23   Barbarann Nest, MD  docusate sodium  (COLACE) 100 MG capsule Take 1 capsule (100 mg total) by mouth 2 (two) times daily. 11/21/23   Barbarann Nest, MD  ferrous sulfate  325 (65 FE) MG tablet Take 1 tablet (325 mg total) by mouth daily with breakfast. 11/21/23   Barbarann Nest, MD  gabapentin  (NEURONTIN ) 100 MG capsule Take 2 capsules (200 mg total) by mouth 3 (three) times daily. 11/21/23   Barbarann Nest, MD  insulin  aspart (NOVOLOG ) 100 UNIT/ML injection Inject 0-6 Units into the skin 3 (three) times daily with meals. 11/21/23   Barbarann Nest, MD  insulin  glargine-yfgn (SEMGLEE ) 100 UNIT/ML injection Inject 15 Units into the skin daily.    [provider]  isosorbide -hydrALAZINE  (BIDIL ) 20-37.5 MG tablet Take 2 tablets by mouth 3 (three) times daily. 05/25/23   Fairy Frames, MD  LANTUS  SOLOSTAR 100 UNIT/ML Solostar Pen Inject 12 Units into the skin daily. 11/21/23   Barbarann Nest, MD  latanoprost  (XALATAN ) 0.005 % ophthalmic solution Place 1 drop  into both eyes at bedtime. 11/21/23   Barbarann Nest, MD  leptospermum manuka honey (MEDIHONEY) PSTE paste Apply 1 Application topically daily. Apply to buttocks and left heel as directed 11/21/23   Barbarann Nest, MD  lubiprostone  (AMITIZA ) 24 MCG capsule Take 1 capsule (24 mcg total) by mouth daily. 11/21/23   Barbarann Nest, MD  melatonin 3 MG TABS tablet Take 1 tablet (3 mg total) by mouth at bedtime. 11/21/23   Barbarann Nest, MD  methocarbamol  (ROBAXIN ) 500 MG tablet Take 1 tablet (500 mg total) by mouth every 6 (six) hours as needed for muscle spasms. 11/21/23   Barbarann Nest, MD  metoCLOPramide  (REGLAN ) 5 MG tablet Take 1 tablet (5 mg total) by mouth 3 (three) times daily. 11/21/23 11/20/24  Barbarann Nest, MD  metoprolol  succinate (TOPROL -XL) 100 MG 24 hr tablet Take 100 mg by mouth daily. 05/29/23   [provider]  Multiple Vitamin (MULTIVITAMIN WITH MINERALS) TABS tablet Take 1 tablet by mouth daily. 11/21/23   Barbarann Nest, MD  Multiple Vitamins-Minerals (CERTAVITE SENIOR) TABS Take 1 tablet by mouth daily. 11/21/23   Barbarann Nest, MD  nitroGLYCERIN  (NITROSTAT ) 0.4 MG SL tablet Place 1 tablet (0.4 mg total) under the tongue every 5 (five) minutes as needed for chest pain. 11/21/23   Barbarann Nest, MD  ondansetron  (ZOFRAN ) 4 MG tablet Take 1 tablet (4 mg total) by mouth every 6 (six) hours as needed for nausea or vomiting. 11/21/23   Barbarann Nest, MD  OXYGEN Inhale 3 L/min into the lungs continuous.    [provider]  pantoprazole  (PROTONIX ) 40 MG tablet Take 1 tablet (40 mg total) by mouth daily. 11/21/23   Barbarann Nest, MD  polyethylene glycol powder (GLYCOLAX /MIRALAX ) 17 GM/SCOOP powder Take 17 g by mouth daily. PRN order: 17 g by mouth daily as needed for constipation 11/21/23   Barbarann Nest, MD  pyridoxine  (B-6) 100 MG tablet Take 1 tablet (100 mg total) by mouth daily. 11/21/23   Barbarann Nest, MD  sertraline  (ZOLOFT ) 25 MG tablet Take 1 tablet (25 mg total) by mouth  daily. 11/21/23   Barbarann Nest, MD  sodium chloride  (OCEAN) 0.65 % SOLN nasal spray Place 1 spray into both nostrils as needed for congestion. 11/21/23   Barbarann Nest, MD  torsemide  (DEMADEX ) 20 MG tablet Take 1 tablet (20 mg total) by mouth daily. 11/21/23   Barbarann Nest, MD  traMADol  (ULTRAM ) 50 MG tablet Take 1 tablet (50 mg total) by mouth 3 (three) times daily as needed for moderate pain (pain score 4-6) or severe pain (pain score 7-10). 11/21/23   Barbarann Nest, MD  traMADol  (ULTRAM ) 50 MG tablet Take 1 tablet (50 mg total) by mouth 3 (three) times daily as needed for moderate pain ( pain score 4-6) or severe pain (pain score 7-10). 11/21/23   Barbarann Nest, MD  traZODone  (DESYREL ) 100 MG tablet Take 1 tablet (100 mg total) by mouth at bedtime. 11/21/23   Barbarann Nest, MD  UNABLE TO FIND Take 30 mLs by mouth in the morning and at bedtime. Med Name: protein liquid    [provider]   Allergies  Allergen Reactions   Nsaids Other (See Comments)    CKD stage 3    Review of Systems Weakness Physical Exam Awake alert Resting in bed Generalized weakness No acute distress Regular work of breathing Has right facial droop Has contractures Bilateral lower extremities in boots  Vital Signs: BP (!) 163/78 (BP Location: Right Arm)   Pulse (!) 54  Temp 98.7 F (37.1 C)   Resp 18   Ht 6' 2 (1.88 m)   Wt 85 kg   SpO2 97%   BMI 24.06 kg/m  Pain Scale: 0-10 POSS *See Group Information*: S-Acceptable,Sleep, easy to arouse Pain Score: 7    SpO2: SpO2: 97 % O2 Device:SpO2: 97 % O2 Flow Rate: .O2 Flow Rate (L/min): 1 L/min  IO: Intake/output summary:  Intake/Output Summary (Last 24 hours) at 11/24/2023 1417 Last data filed at 11/24/2023 0854 Gross per 24 hour  Intake 240 ml  Output 500 ml  Net -260 ml    LBM: Last BM Date : 11/21/23 Baseline Weight: Weight: 87.9 kg Most recent weight: Weight: 85 kg     Palliative Assessment/Data:   Palliative performance scale  60%  Time In: 1300 Time Out: 1400 Time Total: 60 Greater than 50%  of this time was spent counseling and coordinating care related to the above assessment and plan.  Signed by: Lonia Serve, MD   Please contact Palliative Medicine Team phone at [phone] for questions and concerns.  For individual provider: See Tracey

## 2023-11-25 ENCOUNTER — Other Ambulatory Visit: Payer: Self-pay | Admitting: Cardiology

## 2023-11-25 ENCOUNTER — Other Ambulatory Visit (HOSPITAL_COMMUNITY): Payer: Self-pay

## 2023-11-25 DIAGNOSIS — I10 Essential (primary) hypertension: Secondary | ICD-10-CM

## 2023-11-25 DIAGNOSIS — I5043 Acute on chronic combined systolic (congestive) and diastolic (congestive) heart failure: Secondary | ICD-10-CM | POA: Diagnosis not present

## 2023-11-25 LAB — GLUCOSE, CAPILLARY
Glucose-Capillary: 173 mg/dL — ABNORMAL HIGH (ref 70–99)
Glucose-Capillary: 236 mg/dL — ABNORMAL HIGH (ref 70–99)

## 2023-11-25 NOTE — Progress Notes (Signed)
 Pt expressed fears and concerns surrounding his wife (currently also a pt) being discharged to her home by herself.   He also shared his fervent hope and desire that they will both be able to reside at the same SNF, ideally in the same room, but if not, at least at the same facility.

## 2023-11-25 NOTE — Progress Notes (Signed)
 Progress Note   Patient: David [name] FMW:[id number] DOB: January 23, 1946 DOA: 10/29/2023     26 DOS: the patient was seen and examined on 11/25/2023   Brief hospital course: 78yo with h/o CAD s/p stent, chronic combined CHF, stage 4 CKD, DM, and HTN who presented on 6/12 with SOB.  He was treated for acute on chronic CHF and effectively diuresed.  Disposition has been complicated, but he was finally recognized as having LTC Medicaid and so should be able to dc to a facility soon.  He is medically stable.  Likely being discharged to skilled nursing facility in the morning tomorrow.  Assessment and Plan:  Acute on chronic combined CHF, acute hypoxic respiratory failure Imaging on admission of the CT of the chest did not show any PE but it did show CHF with pulmonary edema and bilateral pleural effusions Has been diuresed and now placed on torsemide  with improvement   He is net -5.1 L, now reports feeling much better and close to baseline Continue Bidil    CAP Possible pneumonia overlay RSV, Covid, flu all negative Status post 5 days of ceftriaxone /azithromycin  Respiratory status stable C/o nasal drip - changed O2 to humidified with nasal saline prn   Troponin elevation Flat, not in a pattern consistent with ACS Likely demand ischemia in the setting of CHF   Epigastric discomfort /intermittent anxiety Improved after Reglan  Can be discharged on Reglan  PRN, Zoloft , Bentyl  as well Continue good bowel regimen   Macrocytic anemia  Hemoglobin dipped to 6.0 on 6/18, received 1 unit of packed red blood cells GI consulted and performed EGD which showed nonbleeding gastric ulcer, mucosal changes in the duodenal, and biopsies showed chronic active gastritis with ulcer and purulent exudates, iron  deposits suggestive of iron  pill gastritis Colonoscopy was offered but he declined   Essential hypertension Continue Bidil  Hold amlodipine  and Toprol  XL   Acute kidney injury on CKD stage IV   Baseline creatinine between 2 and 3, it was as high as 3.58 on 6/18 Improved back to baseline  Avoid nephrotoxic medications   CAD s/p stent  No chest discomfort Continue atorvastatin    DMII  Hemoglobin A1c 6.0, good control Continue Lantus    Chronic diabetic foot ulcer b/l  Per wound care Unstageable Pressure Injury right and left heel; left and right lateral feet'; all are 100% eschar  Arterial ulceration left medial great toe; 100% clean Pressure Injury POA: Yes Cleanse all foot wounds with saline pat dry Paint all foot wounds with betadine, allow to air dry. Ok to cover with foam. Reapply betadine daily.  Offload heels at all times with prevalon boots Single layer of xeroform to the great toe wound, top with dry dressing or bandaid. Change daily Patient was supposed to follow-up with vascular surgery but did not after last hospitalization Treated with doxycycline  for 10 days ABI normal on the right and moderate left lower extremity arterial disease.   Will need follow-up with vascular surgery as outpatient      Subjective: Denies any fever or chills.  He said he wants to sleep better.  Physical Exam: Vitals:   11/24/23 2024 11/25/23 0326 11/25/23 0535 11/25/23 1320  BP: (!) 174/81  (!) 166/86 (!) 158/66  Pulse: 64  (!) 56 (!) 55  Resp: 16  16 14   Temp: 98.5 F (36.9 C)  97.6 F (36.4 C) 98.1 F (36.7 C)  TempSrc:      SpO2: 98%  100% 100%  Weight:  85 kg    Height:  Constitutional: Alert, awake, calm, comfortable HEENT: Neck supple Respiratory: clear to auscultation bilaterally, no wheezing, no crackles. Normal respiratory effort. No accessory muscle use.  Cardiovascular: Regular rate and rhythm, no murmurs / rubs / gallops. No extremity edema. 2+ pedal pulses. No carotid bruits.  Abdomen: no tenderness, no masses palpated. No hepatosplenomegaly. Bowel sounds positive.  Musculoskeletal: no clubbing / cyanosis. No joint deformity upper and lower  extremities. Good ROM, no contractures. Normal muscle tone.  Skin: no rashes, lesions, ulcers. No induration Neurologic: CN 2-12 grossly intact. Sensation intact, DTR normal. Strength 5/5 x all 4 extremities.  Psychiatric: Normal judgment and insight. Alert and oriented x 3. Normal mood.   Data Reviewed:  There are no new results to review at this time.  Family Communication: None at beside today  Disposition: Status is: Inpatient Remains inpatient appropriate because: Waiting on placement  Planned Discharge Destination: Skilled nursing facility    Time spent: 35 minutes  Author: Nena Rebel, MD 11/25/2023 4:10 PM  For on call review [email or web address].

## 2023-11-25 NOTE — Plan of Care (Signed)
   Problem: Education: Goal: Ability to describe self-care measures that may prevent or decrease complications (Diabetes Survival Skills Education) will improve Outcome: Progressing Goal: Individualized Educational Video(s) Outcome: Progressing

## 2023-11-25 NOTE — Plan of Care (Signed)
   Problem: Fluid Volume: Goal: Ability to maintain a balanced intake and output will improve Outcome: Progressing   Problem: Health Behavior/Discharge Planning: Goal: Ability to manage health-related needs will improve Outcome: Progressing

## 2023-11-25 NOTE — TOC Progression Note (Addendum)
 Transition of Care [facility]) - Progression Note    Patient Details  Name: David [name] MRN: [id number] Date of Birth: [birth year]  Transition of Care [facility]) CM/SW Contact  Doneta Glenys DASEN, RN Phone Number: 11/25/2023, 9:20 AM  Clinical Narrative:    CM reach out [facility] ([facility]) for update on bed offer.Waiting on insurance approval 3:20 PM CM reach out to [facility] for update. No update, still waiting. 3:51 PM We have approval and admit tomorrow. MD made aware via chat.   Expected Discharge Plan: Skilled Nursing Facility Barriers to Discharge: Barriers Resolved  Expected Discharge Plan and Services In-house Referral: NA Discharge Planning Services: CM Consult Post Acute Care Choice: NA Living arrangements for the past 2 months: Skilled Nursing Facility Expected Discharge Date: 11/19/23               DME Arranged: Hospital bed, Other see comment, Oxygen [facility]) DME Agency: [employer] Date DME Agency Contacted: 11/19/23 Time DME Agency Contacted: 1505 Representative spoke with at DME Agency: [place] HH Arranged: RN, PT, OT, Nurse's Aide, Social Work [employer] Agency: [facility] Date HH Agency Contacted: 11/18/23 Time HH Agency Contacted: 0845 Representative spoke with at [facility] Agency: [place]   Social Determinants of Health (SDOH) Interventions SDOH Screenings   Food Insecurity: No Food Insecurity (10/30/2023)  Housing: Low Risk  (10/30/2023)  Transportation Needs: No Transportation Needs (10/30/2023)  Utilities: Not At Risk (10/30/2023)  Alcohol Screen: Low Risk  (05/18/2023)  Financial Resource Strain: Low Risk  (05/18/2023)  Physical Activity: Inactive (08/20/2017)  Social Connections: Socially Integrated (10/30/2023)  Stress: No Stress Concern Present (08/20/2017)  Tobacco Use: Medium Risk (11/07/2023)    Readmission Risk Interventions    11/09/2023   11:30 AM 11/06/2023    8:44 AM  Readmission Risk Prevention Plan  Transportation  Screening Complete Complete  Medication Review (RN Care Manager) Complete Complete  PCP or Specialist appointment within 3-5 days of discharge Complete   HRI or Home Care Consult Complete Complete  SW Recovery Care/Counseling Consult Complete Complete  Palliative Care Screening Not Applicable Not Applicable  Skilled Nursing Facility Complete Not Applicable

## 2023-11-25 NOTE — Progress Notes (Signed)
 Daily Progress Note   Patient Name: David [name]       Date: 11/25/2023 DOB: 06-19-1945  Age: 78 y.o. MRN#: [id number] Attending Physician: Roann Gouty, MD Primary Care Physician: Rexanne Ingle, MD Admit Date: 10/29/2023  Reason for Consultation/Follow-up: Establishing goals of care  Subjective: Resting in bed.   Length of Stay: 26  Current Medications: Scheduled Meds:   bisacodyl   10 mg Rectal Once   docusate sodium   100 mg Oral Daily   feeding supplement  237 mL Oral BID BM   insulin  aspart  0-6 Units Subcutaneous TID WC   isosorbide -hydrALAZINE   2 tablet Oral TID   latanoprost   1 drop Both Eyes QHS   metoCLOPramide  (REGLAN ) injection  5 mg Intravenous Q6H   pantoprazole   40 mg Oral Daily   sertraline   25 mg Oral Daily   sodium chloride   1 spray Each Nare Q2H   torsemide   20 mg Oral Daily   traZODone   100 mg Oral QHS    Continuous Infusions:   PRN Meds: acetaminophen  **OR** acetaminophen , albuterol , bisacodyl , dicyclomine , guaiFENesin -dextromethorphan , ondansetron  (ZOFRAN ) IV, simethicone , traMADol   Physical Exam         Resting in acute distress Regular work of breathing Appears chronically ill  Vital Signs: BP (!) 166/86 (BP Location: Left Arm)   Pulse (!) 56   Temp 97.6 F (36.4 C)   Resp 16   Ht 6' 2 (1.88 m)   Wt 85 kg   SpO2 100%   BMI 24.06 kg/m  SpO2: SpO2: 100 % O2 Device: O2 Device: Room Air O2 Flow Rate: O2 Flow Rate (L/min): 1 L/min  Intake/output summary:  Intake/Output Summary (Last 24 hours) at 11/25/2023 1048 Last data filed at 11/25/2023 0740 Gross per 24 hour  Intake 120 ml  Output 1250 ml  Net -1130 ml   LBM: Last BM Date : 11/21/23 Baseline Weight: Weight: 87.9 kg Most recent weight: Weight: 85 kg       Palliative  Assessment/Data:      Patient Active Problem List   Diagnosis Date Noted   Troponin level elevated 11/18/2023   Symptomatic anemia 09/04/2023   Chronic combined systolic and diastolic congestive heart failure (HCC) 09/04/2023   Acute on chronic combined systolic and diastolic CHF (congestive heart failure) (HCC) 05/12/2023   Malnutrition of moderate  degree 05/06/2023   Diabetic foot infection (HCC) 05/05/2023   Sacral wound 05/05/2023   Pleural effusion on right 05/05/2023   Elevated TSH 05/05/2023   Acute on chronic diastolic (congestive) heart failure (HCC) 05/05/2023   Diastolic CHF (HCC) 05/04/2023   Class 1 obesity 06/30/2022   Acute on chronic diastolic CHF (congestive heart failure) (HCC) 06/29/2022   PAD (peripheral artery disease) (HCC) 06/13/2022   Critical limb ischemia of left lower extremity (HCC) 03/21/2021   Nonhealing ulcer of left lower extremity limited to breakdown of skin (HCC) 03/21/2021   Abscess of left foot 08/03/2020   Osteomyelitis of fifth toe of left foot (HCC)    Mixed hyperlipidemia 10/15/2018   Bilateral leg edema 09/17/2018   Stage 3b chronic kidney disease (HCC) 09/10/2018   Anemia 09/10/2018   Pain and swelling of wrist, right 08/14/2018   Bilateral carotid artery stenosis 07/30/2018   Coronary artery disease involving native coronary artery of native heart without angina pectoris 07/30/2018   Snoring 02/11/2018   Chronic heart failure with preserved ejection fraction (HFpEF) (HCC) 12/22/2017   At risk for adverse drug reaction 12/15/2017   Chronic pain syndrome 12/15/2017   Status post coronary artery stent placement    Pneumonia due to infectious organism 03/30/2017   Type 2 diabetes mellitus with hyperlipidemia (HCC) 03/29/2017   Acute respiratory failure with hypoxemia (HCC) 03/29/2017   Acute on chronic respiratory failure with hypoxia (HCC)    Pulmonary congestion    Acquired contracture of Achilles tendon, right 08/19/2016    Amputated great toe, right (HCC) 07/18/2016   Onychomycosis 05/29/2016   Diabetic polyneuropathy associated with type 2 diabetes mellitus (HCC) 04/09/2016   Right foot ulcer, limited to breakdown of skin (HCC) 04/09/2016   Spinal stenosis of lumbar region without neurogenic claudication 05/24/2014   Hereditary and idiopathic peripheral neuropathy 09/15/2013   Malaise 08/14/2013   Essential hypertension 08/14/2013   Chronic back pain 08/14/2013   Glaucoma 08/14/2013   Headache 08/14/2013    Palliative Care Assessment & Plan   Patient Profile:   Assessment: 78 year old gentleman, artery disease status post stent, chronic combined congestive heart failure, stage IV CKD diabetes hypertension Admitted for shortness of breath Treated under hospital medicine service for acute on chronic CHF Given diuresis TOC following closely Palliative consult for further goals of care discussions  Recommendations/Plan: Recommend addition of palliative services at next to skilled nursing facility-patient being considered for [facility] facility  Code Status:    Code Status Orders  (From admission, onward)           Start     Ordered   10/30/23 0001  Full code  Continuous       Question:  By:  Answer:  Consent: discussion documented in EHR   10/30/23 0007           Code Status History     Date Active Date Inactive Code Status Order ID Comments User Context   09/04/2023 1359 09/09/2023 1605 Full Code [id number]  Celinda Alm Lot, MD ED   05/12/2023 0220 05/25/2023 2125 Full Code [id number]  Dub Mancel HERO, MD ED   05/04/2023 2243 05/11/2023 2301 Full Code [id number]  Silvester Ales, MD ED   06/29/2022 1129 07/04/2022 2139 Full Code [id number]  Harlow Ozell BRAVO, MD ED   08/03/2020 1226 08/05/2020 0114 Full Code [id number]  Persons, Ronal Dragon, PA Inpatient   08/05/2018 1049 08/15/2018 1712 Full Code [id number]  Jadine Toribio SQUIBB, MD ED   12/19/2017 1326 12/22/2017 1832  Full Code [id number]   Leopold Damien NOVAK, MD Inpatient   11/24/2017 1717 12/09/2017 1654 Full Code [id number]  Barbarann Nest, MD ED   03/29/2017 0231 04/10/2017 0146 Full Code [id number]  Charlton Evalene RAMAN, MD ED   09/04/2016 0113 09/08/2016 1624 Full Code [id number]  Leopold Damien NOVAK, MD Inpatient   02/07/2015 0036 02/09/2015 1943 Full Code [id number]  Tobie Yetta HERO, MD Inpatient   09/14/2013 0400 09/17/2013 1507 Full Code [id number]  Franky Redia SAILOR, MD Inpatient   08/14/2013 0957 08/19/2013 1800 Full Code [id number]  Alaine Vicenta NOVAK, MD ED       Prognosis:  Unable to determine  Discharge Planning: Skilled Nursing Facility for rehab with Palliative care service follow-up  Care plan was discussed with IDT  Thank you for allowing the Palliative Medicine Team to assist in the care of this patient.  Low MDM     Greater than 50%  of this time was spent counseling and coordinating care related to the above assessment and plan.  Lonia Serve, MD  Please contact Palliative Medicine Team phone at [phone] for questions and concerns.

## 2023-11-25 NOTE — Progress Notes (Signed)
 Occupational Therapy Treatment Patient Details Name: David [name] MRN: [id number] DOB: 03-May-1946 Today's Date: 11/25/2023   History of present illness Pt is a 78 y.o. male admitted 10/29/23 for Acute on Chronic combined systolic/diastolic CHF exacerbation with associate acute hypoxic respiratory failure. Past medical history significant of CAD s/p stent, Combined systolic diastolic heart failure, CKD, GERD, HTN , anemia of chronic illness, DM II, neuropathy   OT comments  Pt in bed upon arrival with wife at bedside visiting with him. Pt required max encouragement to participated in bed mobility/bed level activity, self limiting. Pt continues to require extensive assist with al mbility and ADL tasks, min A self feedig wih modified utensils. OT will continue to follow acutely      If plan is discharge home, recommend the following:  Two people to help with walking and/or transfers;Two people to help with bathing/dressing/bathroom;Assistance with cooking/housework;Assistance with feeding;Direct supervision/assist for medications management;Direct supervision/assist for financial management;Assist for transportation;Help with stairs or ramp for entrance   Equipment Recommendations  None recommended by OT    Recommendations for Other Services      Precautions / Restrictions Precautions Precautions: Fall Recall of Precautions/Restrictions: Intact Precaution/Restrictions Comments: pt reports 2L O2 [place] baseline as needed, wears left knee sleeve/brace, Prevalon boots for bil heel pressure injuries plus Neuropathy all extremities.  Wearing welders gloves to keep his hands warm. Vision deficits. Restrictions Weight Bearing Restrictions Per Provider Order: No Other Position/Activity Restrictions: Non amb since Jan 25       Mobility Bed Mobility Overal bed mobility: Needs Assistance Bed Mobility: Rolling Rolling: Mod assist, Used rails         General bed mobility comments: max A long  sitting halfway up in bed x 3 trials ~ 15 seconds each    Transfers                         Balance                                           ADL either performed or assessed with clinical judgement   ADL                                              Extremity/Trunk Assessment Upper Extremity Assessment Upper Extremity Assessment: Generalized weakness;Right hand dominant   Lower Extremity Assessment Lower Extremity Assessment: Defer to PT evaluation        Vision Baseline Vision/History:  (R eye blindnedd) Ability to See in Adequate Light: 1 Impaired Patient Visual Report: No change from baseline     Perception     Praxis     Communication Communication Communication: No apparent difficulties   Cognition Arousal: Alert Behavior During Therapy: WFL for tasks assessed/performed                                 Following commands: Intact        Cueing   Cueing Techniques: Verbal cues  Exercises      Shoulder Instructions       General Comments      Pertinent Vitals/ Pain       Pain Assessment Pain Assessment: Faces  Faces Pain Scale: Hurts little more Pain Location: generalized with mobility in bed Pain Descriptors / Indicators: Grimacing Pain Intervention(s): Limited activity within patient's tolerance, Monitored during session, Repositioned  Home Living                                          Prior Functioning/Environment              Frequency  Min 2X/week        Progress Toward Goals  OT Goals(current goals can now be found in the care plan section)  Progress towards OT goals: Not progressing toward goals - comment (pt continues to require extensive assist with al mbility and ADL tasks, min A self feedig wih modified utensils)     Plan      Co-evaluation                 AM-PAC OT 6 Clicks Daily Activity     Outcome Measure   Help from  another person eating meals?: A Little Help from another person taking care of personal grooming?: A Lot Help from another person toileting, which includes using toliet, bedpan, or urinal?: Total Help from another person bathing (including washing, rinsing, drying)?: A Lot Help from another person to put on and taking off regular upper body clothing?: A Lot Help from another person to put on and taking off regular lower body clothing?: Total 6 Click Score: 11    End of Session    OT Visit Diagnosis: Other abnormalities of gait and mobility (R26.89);Muscle weakness (generalized) (M62.81);Pain Pain - part of body:  (generalized)   Activity Tolerance No increased pain;Patient limited by fatigue   Patient Left in bed;with call bell/phone within reach;with bed alarm set;with family/visitor present   Nurse Communication Mobility status        Time: 8571-8557 OT Time Calculation (min): 14 min  Charges: OT General Charges $OT Visit: 1 Visit OT Treatments $Therapeutic Activity: 8-22 mins    Jacques Karna Loose 11/25/2023, 3:05 PM

## 2023-11-25 NOTE — Progress Notes (Signed)
 The patient refused Prevelon boots

## 2023-11-26 DIAGNOSIS — I5043 Acute on chronic combined systolic (congestive) and diastolic (congestive) heart failure: Secondary | ICD-10-CM | POA: Diagnosis not present

## 2023-11-26 NOTE — TOC Transition Note (Signed)
 Transition of Care [facility]) - Discharge Note   Patient Details  Name: David [name] MRN: [id number] Date of Birth: [birth year]  Transition [facility]) CM/SW Contact:  Doneta Glenys DASEN, RN Phone Number: 11/26/2023, 9:45 AM   Clinical Narrative:    Patient will DC to: [place] Family notified:[name], Chole, [name] Transport ab:EUJM   Per MD patient ready for DC to [facility] . RN to call report prior to discharge [phone] [id number]). RN, patient, patient's family, and facility notified of DC. Discharge Summary and FL2 sent to facility. DC packet on chart includes face sheet, medical necessity, discharge summary, one signed prescription, and discharge instruction,. Ambulance [facility]) transport requested for patient at 11:25 am.  CM will sign off for now.  Please consult us  again if new needs arise.     Final next level of care: Long Term Acute Care (LTAC) [facility]) Barriers to Discharge: Barriers Resolved   Patient Goals and CMS Choice Patient states their goals for this hospitalization and ongoing recovery are:: LTC CMS Medicare.gov Compare Post Acute Care list provided to:: Patient Choice offered to / list presented to : Patient [place] ownership interest in [facility].provided to:: Parent NA    Discharge Placement              Patient chooses bed at: [facility] Patient to be transferred to facility by: [facility] Name of family member notified: Raymond Distance, [place] Patient and family notified of of transfer: 11/26/23  Discharge Plan and Services Additional resources added to the After Visit Summary for   In-house Referral: NA Discharge Planning Services: CM Consult Post Acute Care Choice: NA          DME Arranged:  (CANCELLED) DME Agency: NA Date DME Agency Contacted: 11/19/23 Time DME Agency Contacted: 71 Representative spoke with at DME Agency: [place] HH Arranged: NA HH Agency: NA Date HH Agency Contacted: 11/18/23 Time HH Agency  Contacted: 0845 Representative spoke with at [facility] Agency: [place]  Social Drivers of Health (SDOH) Interventions SDOH Screenings   Food Insecurity: No Food Insecurity (10/30/2023)  Housing: Low Risk  (10/30/2023)  Transportation Needs: No Transportation Needs (10/30/2023)  Utilities: Not At Risk (10/30/2023)  Alcohol Screen: Low Risk  (05/18/2023)  Financial Resource Strain: Low Risk  (05/18/2023)  Physical Activity: Inactive (08/20/2017)  Social Connections: Socially Integrated (10/30/2023)  Stress: No Stress Concern Present (08/20/2017)  Tobacco Use: Medium Risk (11/07/2023)     Readmission Risk Interventions    11/09/2023   11:30 AM 11/06/2023    8:44 AM  Readmission Risk Prevention Plan  Transportation Screening Complete Complete  Medication Review [employer]) Complete Complete  PCP or Specialist appointment within 3-5 days of discharge Complete   HRI or Home Care Consult Complete Complete  SW Recovery Care/Counseling Consult Complete Complete  Palliative Care Screening Not Applicable Not Applicable  Skilled Nursing Facility Complete Not Applicable

## 2023-11-26 NOTE — Plan of Care (Signed)
 PMT no charge note Chart reviewed TOC note reviewed It appears that the patient is to be discharged today to [facility] facility Recommend outpatient palliative support No charge Lonia Serve MD [facility] health palliative.

## 2023-11-26 NOTE — Discharge Summary (Signed)
 Physician Discharge Summary   Patient: David [name] MRN: [id number] DOB: 10-31-1945  Admit date:     10/29/2023  Discharge date: 11/26/23  Discharge Physician: Nena Rebel   PCP: Rexanne Ingle, MD   Recommendations at discharge:    You have no more available rehabilitation days in SNF and so would have to pay out of pocket You would have to have Medicaid in order for long-term care to be an option, other than paying out of pocket Hold amlodipine  and Toprol  XL Follow up with Dr. Micki in 1-2 weeks Follow up with vascular surgery; referral placed   Discharge Diagnoses: Principal Problem:   Acute on chronic combined systolic and diastolic CHF (congestive heart failure) (HCC) Active Problems:   Essential hypertension   Type 2 diabetes mellitus with hyperlipidemia (HCC)   Pneumonia due to infectious organism   Diabetic foot infection (HCC)   Troponin level elevated  Resolved Problems:   * No resolved hospital problems. [facility] Course: [phone] with h/o CAD s/p stent, chronic combined CHF, stage 4 CKD, DM, and HTN who presented on 6/12 with SOB.  He was treated for acute on chronic CHF and effectively diuresed.  He is blood pressure was initially low and heart rate was low as well.  His amlodipine  and metoprolol  has been held.  They could be restarted once blood pressure and heart rate is stable.   Disposition has been complicated, but he was finally recognized as having LTC Medicaid and so should be able to dc to a facility soon.  He is medically stable. His disposition was delayed due to lack of SNF insurance.  Now he has a LTC [employer] and he is being planned for discharge to [facility] today. Patient has been on oxygen.  Assessment and Plan: Acute on chronic combined CHF/acute hypoxemic respiratory failure - Treated with torsemide  with improvement.  He is net -5.1 L, he is feeling much better and close to baseline.  Continue BiDil .  His metoprolol  and amlodipine  has  been held due to bradycardia and hypotension.  Since she has been on BiDil , needs to be discussed with cardiologist to restart those medications.  CAP: S/p ceftriaxone /azithromycin , completed the course  Acutely elevated troponin not consistent with ACS likely due to demand ischemia due to CHF No chest pain  Epigastric discomfort/intermittent anxiety: Improved after Reglan  resolved  Macrocytic anemia  Hemoglobin dipped to 6.0 on 6/18, received 1 unit of packed red blood cells GI consulted and performed EGD which showed nonbleeding gastric ulcer, mucosal changes in the duodenal, and biopsies showed chronic active gastritis with ulcer and purulent exudates, iron  deposits suggestive of iron  pill gastritis Colonoscopy was offered but he declined   Essential hypertension Continue Bidil  Hold amlodipine  and Toprol  XL   Acute kidney injury on CKD stage IV  Baseline creatinine between 2 and 3, it was as high as 3.58 on 6/18 Improved back to baseline  Avoid nephrotoxic medications   CAD s/p stent  No chest discomfort Continue atorvastatin    DMII  Hemoglobin A1c 6.0, good control Continue Lantus    Chronic diabetic foot ulcer b/l  Per wound care Unstageable Pressure Injury right and left heel; left and right lateral feet'; all are 100% eschar  Arterial ulceration left medial great toe; 100% clean Pressure Injury POA: Yes Cleanse all foot wounds with saline pat dry Paint all foot wounds with betadine, allow to air dry. Ok to cover with foam. Reapply betadine daily.  Offload heels at all times with prevalon  boots Single layer of xeroform to the great toe wound, top with dry dressing or bandaid. Change daily Patient was supposed to follow-up with vascular surgery but did not after last hospitalization Treated with doxycycline  for 10 days ABI normal on the right and moderate left lower extremity arterial disease.   Will need follow-up with vascular surgery as outpatient           Consultants: GI Procedures performed:   Echocardiogram 6/16 ABI 6/16 EGD 6/21 Disposition: Long term care facility Diet recommendation:  Discharge Diet Orders (From admission, onward)     Start     Ordered   11/26/23 0000  Diet - low sodium heart healthy        11/26/23 1046   11/11/23 0000  Diet - low sodium heart healthy        11/11/23 0841   11/11/23 0000  Diet Carb Modified        11/11/23 0841           Regular diet DISCHARGE MEDICATION: Allergies as of 11/26/2023       Reactions   Nsaids Other (See Comments)   CKD stage 3         Medication List     PAUSE taking these medications    amLODipine  10 MG tablet Wait to take this until your doctor or other care provider tells you to start again. Commonly known as: NORVASC  Take 10 mg by mouth daily.   metoprolol  succinate 100 MG 24 hr tablet Wait to take this until your doctor or other care provider tells you to start again. Commonly known as: TOPROL -XL Take 100 mg by mouth daily.       STOP taking these medications    bismuth subsalicylate 262 MG/15ML suspension Commonly known as: PEPTO BISMOL   insulin  glargine-yfgn 100 UNIT/ML injection Commonly known as: SEMGLEE        TAKE these medications    Acetaminophen  Extra Strength 500 MG Tabs Take 2 tablets (1,000 mg total) by mouth in the morning and at bedtime.   albuterol  108 (90 Base) MCG/ACT inhaler Commonly known as: VENTOLIN  HFA Inhale 2 puffs into the lungs every 6 (six) hours as needed for wheezing or shortness of breath. What changed: Another medication with the same name was added. Make sure you understand how and when to take each.   albuterol  108 (90 Base) MCG/ACT inhaler Commonly known as: VENTOLIN  HFA Inhale 2 puffs into the lungs every 6 (six) hours as needed for wheezing or shortness of breath.. What changed: You were already taking a medication with the same name, and this prescription was added. Make sure you understand how  and when to take each.   aspirin  EC 81 MG tablet Take 1 tablet (81 mg total) by mouth daily.   atorvastatin  80 MG tablet Commonly known as: LIPITOR  Take 1 tablet (80 mg total) by mouth at bedtime.   bisacodyl  10 MG suppository Commonly known as: DULCOLAX Place 1 suppository (10 mg total) rectally daily as needed for moderate constipation.   CertaVite Senior Tabs Take 1 tablet by mouth daily.   cholecalciferol  25 MCG (1000 UNIT) tablet Commonly known as: VITAMIN D3 Take 5 tablets (5,000 Units total) by mouth daily.   cyanocobalamin  1000 MCG tablet Commonly known as: VITAMIN B12 Take 2 tablets (2,000 mcg total) by mouth daily.   Deep Sea Nasal Spray 0.65 % nasal spray Generic drug: sodium chloride  Place 1 spray into both nostrils as needed for congestion.   dextromethorphan -guaiFENesin   30-600 MG 12hr tablet Commonly known as: MUCINEX  DM Take 1 tablet by mouth 2 (two) times daily.   dicyclomine  10 MG capsule Commonly known as: BENTYL  Take 1 capsule (10 mg total) by mouth in the morning, at noon, in the evening, and at bedtime.   docusate sodium  100 MG capsule Commonly known as: COLACE Take 1 capsule (100 mg total) by mouth 2 (two) times daily.   FeroSul 325 (65 Fe) MG tablet Generic drug: ferrous sulfate  Take 1 tablet (325 mg total) by mouth daily with breakfast.   gabapentin  100 MG capsule Commonly known as: NEURONTIN  Take 2 capsules (200 mg total) by mouth 3 (three) times daily.   isosorbide -hydrALAZINE  20-37.5 MG tablet Commonly known as: BIDIL  Take 2 tablets by mouth three times daily.   Lantus  SoloStar 100 UNIT/ML Solostar Pen Generic drug: insulin  glargine Inject 12 Units into the skin daily.   latanoprost  0.005 % ophthalmic solution Commonly known as: XALATAN  Place 1 drop into both eyes at bedtime.   leptospermum manuka honey Pste paste Apply 1 Application topically daily. Apply to buttocks and left heel as directed   lubiprostone  24 MCG  capsule Commonly known as: AMITIZA  Take 1 capsule (24 mcg total) by mouth daily.   melatonin 3 MG Tabs tablet Take 1 tablet (3 mg total) by mouth at bedtime.   methocarbamol  500 MG tablet Commonly known as: ROBAXIN  Take 1 tablet (500 mg total) by mouth every 6 (six) hours as needed for muscle spasms.   metoCLOPramide  5 MG tablet Commonly known as: Reglan  Take 1 tablet (5 mg total) by mouth 3 (three) times daily.   multivitamin with minerals Tabs tablet Take 1 tablet by mouth daily.   nitroGLYCERIN  0.4 MG SL tablet Commonly known as: NITROSTAT  Place 1 tablet (0.4 mg total) under the tongue every 5 (five) minutes as needed for chest pain.   NovoLOG  100 UNIT/ML injection Generic drug: insulin  aspart Inject 0-6 Units into the skin 3 (three) times daily with meals.   ondansetron  4 MG tablet Commonly known as: ZOFRAN  Take 1 tablet (4 mg total) by mouth every 6 (six) hours as needed for nausea or vomiting.   OXYGEN Inhale 3 L/min into the lungs continuous.   pantoprazole  40 MG tablet Commonly known as: Protonix  Take 1 tablet (40 mg total) by mouth daily.   polyethylene glycol powder 17 GM/SCOOP powder Commonly known as: GLYCOLAX /MIRALAX  Take 17 g by mouth daily. PRN order: 17 g by mouth daily as needed for constipation   pyridOXINE  100 MG tablet Commonly known as: VITAMIN B6 Take 1 tablet (100 mg total) by mouth daily.   sertraline  25 MG tablet Commonly known as: ZOLOFT  Take 1 tablet (25 mg total) by mouth daily.   torsemide  20 MG tablet Commonly known as: DEMADEX  Take 1 tablet (20 mg total) by mouth daily. What changed: when to take this   traMADol  50 MG tablet Commonly known as: ULTRAM  Take 1 tablet (50 mg total) by mouth 3 (three) times daily as needed for moderate pain (pain score 4-6) or severe pain (pain score 7-10).   traZODone  100 MG tablet Commonly known as: DESYREL  Take 1 tablet (100 mg total) by mouth at bedtime.   UNABLE TO FIND Take 30 mLs by mouth  in the morning and at bedtime. Med Name: protein liquid               Durable Medical Equipment  (From admission, onward)           Start  Ordered   11/19/23 1616  DME Oxygen  Once       Comments: Include humidifier  Question Answer Comment  Length of Need Lifetime   Mode or (Route) Nasal cannula   Liters per Minute 2   Frequency Continuous (stationary and portable oxygen unit needed)   Oxygen conserving device Yes   Oxygen delivery system Gas      11/19/23 1616   11/19/23 1605  For home use only DME Other see comment  Once       Comments: Deitra lift  Question:  Length of Need  Answer:  Lifetime   11/19/23 1604   11/19/23 1604  For home use only DME Hospital bed  Once       Question Answer Comment  Length of Need Lifetime   The above medical condition requires: Patient requires the ability to reposition frequently   Bed type Semi-electric      11/19/23 1604              Discharge Care Instructions  (From admission, onward)           Start     Ordered   11/19/23 0000  Discharge wound care:       Comments: Cleanse all foot wounds with saline pat dry Paint all foot wounds with betadine, allow to air dry. Ok to cover with foam. Reapply betadine daily.  Offload heels at all times with prevalon boots Single layer of xeroform to the great toe wound, top with dry dressing or bandaid. Change daily   11/19/23 1614            Discharge Exam: Filed Weights   11/20/23 0500 11/24/23 0500 11/25/23 0326  Weight: 83 kg 85 kg 85 kg   Constitutional: Alert, awake, calm, comfortable HEENT: Neck supple Respiratory: clear to auscultation bilaterally, no wheezing, no crackles. Normal respiratory effort. No accessory muscle use.  Cardiovascular: Regular rate and rhythm, no murmurs / rubs / gallops. No extremity edema. 2+ pedal pulses. No carotid bruits.  Abdomen: no tenderness, no masses palpated. No hepatosplenomegaly. Bowel sounds positive.   Musculoskeletal: no clubbing / cyanosis. No joint deformity upper and lower extremities. Good ROM, no contractures. Normal muscle tone.  Skin: no rashes, lesions, ulcers. No induration Neurologic: CN 2-12 grossly intact. Sensation intact, DTR normal. Strength 5/5 x all 4 extremities.  Psychiatric: Normal judgment and insight. Alert and oriented x 3. Normal mood.    Condition at discharge: stable  The results of significant diagnostics from this hospitalization (including imaging, microbiology, ancillary and laboratory) are listed below for reference.   Imaging Studies: VAS US  ABI WITH/WO TBI Result Date: 11/02/2023  LOWER EXTREMITY DOPPLER STUDY Patient Name:  LOKI WUTHRICH  Date of Exam:   11/02/2023 Medical Rec #: [id number]         Accession #:    [id number] Date of Birth: 1946-05-03        Patient Gender: M Patient Age:   19 years Exam Location:  [facility] Procedure:      VAS US  ABI WITH/WO TBI Referring Phys: --------------------------------------------------------------------------------  Indications: Ulcerations to bilateral heels, right lateral foot, left outer              foot. High Risk Factors: Hypertension, hyperlipidemia, Diabetes, past history of                    smoking, coronary artery disease. Other Factors: CHF, CKD III.  Vascular Interventions: Status post Right great  toe (2018) and 2nd toe                         amputations (2020), left 5th ray amputation (2022). Limitations: Today's exam was limited due to bandages. Comparison Study: Prior ABI done 04/25/2023 Performing Technologist: Rachel Pellet RVS  Examination Guidelines: A complete evaluation includes at minimum, Doppler waveform signals and systolic blood pressure reading at the level of bilateral brachial, anterior tibial, and posterior tibial arteries, when vessel segments are accessible. Bilateral testing is considered an integral part of a complete examination. Photoelectric Plethysmograph (PPG) waveforms  and toe systolic pressure readings are included as required and additional duplex testing as needed. Limited examinations for reoccurring indications may be performed as noted.  ABI Findings: +---------+------------------+-----+---------+----------+ Right    Rt Pressure (mmHg)IndexWaveform Comment    +---------+------------------+-----+---------+----------+ Brachial 147                    triphasic           +---------+------------------+-----+---------+----------+ PTA      182               1.24 biphasic            +---------+------------------+-----+---------+----------+ DP       176               1.20 biphasic            +---------+------------------+-----+---------+----------+ Great Toe                                amputation +---------+------------------+-----+---------+----------+ +---------+------------------+-----+---------+--------+ Left     Lt Pressure (mmHg)IndexWaveform Comment  +---------+------------------+-----+---------+--------+ Brachial 145                    triphasic         +---------+------------------+-----+---------+--------+ PTA      106               0.72 biphasic          +---------+------------------+-----+---------+--------+ DP       92                0.63 biphasic          +---------+------------------+-----+---------+--------+ Great Toe                       dampened bandages +---------+------------------+-----+---------+--------+ +-------+-----------+-----------------+------------+------------+ ABI/TBIToday's ABIToday's TBI      Previous ABIPrevious TBI +-------+-----------+-----------------+------------+------------+ Right  1.24       amputation       1.08        amputation   +-------+-----------+-----------------+------------+------------+ Left   0.72       dampened waveform0.63        0.87         +-------+-----------+-----------------+------------+------------+ Right ABIs appear increased compared to  prior study on 04/25/2023. Left ABIs appear essentially unchanged compared to prior study on 04/25/2023.  Summary: Right: Resting right ankle-brachial index is within normal range. Left: Resting left ankle-brachial index indicates moderate left lower extremity arterial disease. *See table(s) above for measurements and observations.  Electronically signed by Gaile New MD on 11/02/2023 at 11:54:32 AM.    Final    DG Chest 1 View Result Date: 11/01/2023 CLINICAL DATA:  Hypoxia, shortness of breath EXAM: CHEST  1 VIEW COMPARISON:  CT chest dated 10/29/2023 FINDINGS: Mild interstitial edema. Small right pleural effusion. Associated bilateral lower  lobe opacities, likely atelectasis. Mild cardiomegaly. No pneumothorax. IMPRESSION: Mild interstitial edema. Small right pleural effusion. Electronically Signed   By: Pinkie Pebbles M.D.   On: 11/01/2023 20:45   ECHOCARDIOGRAM COMPLETE Result Date: 10/30/2023    ECHOCARDIOGRAM REPORT   Patient Name:   DAMETRI OZBURN Date of Exam: 10/30/2023 Medical Rec #:  [id number]        Height:       74.0 in Accession #:    [id number]       Weight:       191.8 lb Date of Birth:  09/12/1945       BSA:          2.135 m Patient Age:    77 years         BP:           177/87 mmHg Patient Gender: M                HR:           60 bpm. Exam Location:  Inpatient Procedure: 2D Echo, Cardiac Doppler, Color Doppler and Strain Analysis (Both            Spectral and Color Flow Doppler were utilized during procedure). Indications:    chf  History:        Patient has prior history of Echocardiogram examinations, most                 recent 06/30/2022.  Sonographer:    Therisa Crouch Referring Phys: [id number] SARA-MAIZ A THOMAS IMPRESSIONS  1. Left ventricular ejection fraction, by estimation, is 35 to 40%. The left ventricle has moderately decreased function. The left ventricle demonstrates global hypokinesis. There is mild left ventricular hypertrophy. Left ventricular diastolic parameters are  consistent with Grade II diastolic dysfunction (pseudonormalization). Elevated left atrial pressure. The average left ventricular global longitudinal strain is -12.2 %. The global longitudinal strain is abnormal.  2. Right ventricular systolic function is mildly reduced. The right ventricular size is mildly enlarged. There is severely elevated pulmonary artery systolic pressure. The estimated right ventricular systolic pressure is 89.3 mmHg.  3. Left atrial size was mildly dilated.  4. The mitral valve is normal in structure. Mild mitral valve regurgitation. No evidence of mitral stenosis.  5. The aortic valve is tricuspid. Aortic valve regurgitation is not visualized. No aortic stenosis is present.  6. The inferior vena cava is dilated in size with <50% respiratory variability, suggesting right atrial pressure of 15 mmHg. FINDINGS  Left Ventricle: Left ventricular ejection fraction, by estimation, is 35 to 40%. The left ventricle has moderately decreased function. The left ventricle demonstrates global hypokinesis. The average left ventricular global longitudinal strain is -12.2 %. Strain was performed and the global longitudinal strain is abnormal. The left ventricular internal cavity size was normal in size. There is mild left ventricular hypertrophy. Left ventricular diastolic parameters are consistent with Grade II diastolic  dysfunction (pseudonormalization). Elevated left atrial pressure. Right Ventricle: The right ventricular size is mildly enlarged. No increase in right ventricular wall thickness. Right ventricular systolic function is mildly reduced. There is severely elevated pulmonary artery systolic pressure. The tricuspid regurgitant velocity is 4.31 m/s, and with an assumed right atrial pressure of 15 mmHg, the estimated right ventricular systolic pressure is 89.3 mmHg. Left Atrium: Left atrial size was mildly dilated. Right Atrium: Right atrial size was normal in size. Pericardium: There is no  evidence of pericardial effusion. Mitral Valve: The mitral valve is normal  in structure. Mild mitral valve regurgitation. No evidence of mitral valve stenosis. Tricuspid Valve: The tricuspid valve is normal in structure. Tricuspid valve regurgitation is mild. Aortic Valve: The aortic valve is tricuspid. Aortic valve regurgitation is not visualized. No aortic stenosis is present. Aortic valve mean gradient measures 3.0 mmHg. Aortic valve peak gradient measures 6.2 mmHg. Aortic valve area, by VTI measures 2.10 cm. Pulmonic Valve: The pulmonic valve was not well visualized. Pulmonic valve regurgitation is trivial. Aorta: The aortic root is normal in size and structure. Venous: The inferior vena cava is dilated in size with less than 50% respiratory variability, suggesting right atrial pressure of 15 mmHg. IAS/Shunts: The interatrial septum was not well visualized.  LEFT VENTRICLE PLAX 2D LVIDd:         4.80 cm      Diastology LVIDs:         3.70 cm      LV e' medial:    4.79 cm/s LV PW:         1.10 cm      LV E/e' medial:  8.6 LV IVS:        1.20 cm      LV e' lateral:   4.82 cm/s LVOT diam:     2.20 cm      LV E/e' lateral: 8.5 LV SV:         59 LV SV Index:   28           2D Longitudinal Strain LVOT Area:     3.80 cm     2D Strain GLS Avg:     -12.2 %  LV Volumes (MOD) LV vol d, MOD A2C: 172.0 ml LV vol d, MOD A4C: 144.0 ml LV vol s, MOD A2C: 97.4 ml LV vol s, MOD A4C: 85.7 ml LV SV MOD A2C:     74.6 ml LV SV MOD A4C:     144.0 ml LV SV MOD BP:      71.6 ml RIGHT VENTRICLE            IVC RV Basal diam:  4.60 cm    IVC diam: 2.30 cm RV S prime:     5.96 cm/s TAPSE (M-mode): 1.6 cm LEFT ATRIUM             Index        RIGHT ATRIUM           Index LA diam:        3.90 cm 1.83 cm/m   RA Area:     20.60 cm LA Vol (A2C):   81.6 ml 38.22 ml/m  RA Volume:   59.50 ml  27.87 ml/m LA Vol (A4C):   89.1 ml 41.73 ml/m LA Biplane Vol: 87.1 ml 40.79 ml/m  AORTIC VALVE AV Area (Vmax):    2.24 cm AV Area (Vmean):   2.32 cm  AV Area (VTI):     2.10 cm AV Vmax:           125.00 cm/s AV Vmean:          84.700 cm/s AV VTI:            0.282 m AV Peak Grad:      6.2 mmHg AV Mean Grad:      3.0 mmHg LVOT Vmax:         73.80 cm/s LVOT Vmean:        51.700 cm/s LVOT VTI:  0.156 m LVOT/AV VTI ratio: 0.55  AORTA Ao Root diam: 3.00 cm MITRAL VALVE               TRICUSPID VALVE MV Area (PHT): 4.49 cm    TR Peak grad:   74.3 mmHg MV E velocity: 41.10 cm/s  TR Vmax:        431.00 cm/s MV A velocity: 58.30 cm/s MV E/A ratio:  0.70        SHUNTS                            Systemic VTI:  0.16 m                            Systemic Diam: 2.20 cm Lonni Nanas MD Electronically signed by Lonni Nanas MD Signature Date/Time: 10/30/2023/4:35:21 PM    Final    CT Angio Chest PE W and/or Wo Contrast Result Date: 10/29/2023 CLINICAL DATA:  Shortness of breath for 2 days getting worse. History of CHF. EXAM: CT ANGIOGRAPHY CHEST WITH CONTRAST TECHNIQUE: Multidetector CT imaging of the chest was performed using the standard protocol during bolus administration of intravenous contrast. Multiplanar CT image reconstructions and MIPs were obtained to evaluate the vascular anatomy. RADIATION DOSE REDUCTION: This exam was performed according to the departmental dose-optimization program which includes automated exposure control, adjustment of the mA and/or kV according to patient size and/or use of iterative reconstruction technique. CONTRAST:  60mL OMNIPAQUE  IOHEXOL  350 MG/ML SOLN COMPARISON:  Same day chest radiograph and CT chest 05/05/2023 FINDINGS: Cardiovascular: Negative for acute pulmonary embolism. Dilated main pulmonary artery measuring 34 mm in diameter. Dilated ascending aorta measuring 40 mm in diameter. Cardiomegaly. Trace pericardial effusion. Coronary artery and aortic atherosclerotic calcification. Mediastinum/Nodes: Shotty mediastinal and hilar lymph nodes are favored reactive. Trachea and esophagus are unremarkable.  Lungs/Pleura: Marked interlobular septal thickening throughout both lungs. Peribronchovascular ground-glass and consolidative opacities greatest in the right lower lobe. Moderate right small left pleural effusions. No pneumothorax. Upper Abdomen: Question gastric wall thickening versus underdistention. Otherwise no acute abnormality. Musculoskeletal: No acute fracture. Review of the MIP images confirms the above findings. IMPRESSION: 1. Negative for acute pulmonary embolism. 2. CHF with pulmonary edema. Moderate right and small left pleural effusions. Superimposed pneumonia in the right lower lobe is not excluded. 3. Dilated main pulmonary artery, which can be seen in the setting of pulmonary hypertension. 4. Question gastritis versus underdistention. 5. Aortic Atherosclerosis (ICD10-I70.0). Electronically Signed   By: Norman Gatlin M.D.   On: 10/29/2023 21:20   DG Chest Portable 1 View Result Date: 10/29/2023 CLINICAL DATA:  Shortness of breath EXAM: PORTABLE CHEST 1 VIEW COMPARISON:  05/19/2023 FINDINGS: Heart is mildly enlarged. Mild vascular congestion. Small right pleural effusion again noted with peripheral airspace opacities throughout the right lung, similar prior study. Diffuse interstitial opacities concerning for edema. IMPRESSION: Cardiomegaly with vascular congestion and probable interstitial edema. Small right pleural effusion with chronic peripheral densities in the right mid and lower lung, favor scarring. Electronically Signed   By: Franky Crease M.D.   On: 10/29/2023 18:35    Microbiology: Results for orders placed or performed during the hospital encounter of 10/29/23  Culture, blood (routine x 2) Call MD if unable to obtain prior to antibiotics being given     Status: None   Collection Time: 10/30/23  2:06 AM   Specimen: BLOOD LEFT ARM  Result Value Ref  Range Status   Specimen Description   Final    BLOOD LEFT ARM Performed at [facility], [address], [place],  [place] [address]    Special Requests   Final    BOTTLES DRAWN AEROBIC AND ANAEROBIC Blood Culture results may not be optimal due to an inadequate volume of blood received in culture bottles Performed at [facility], [address] [place], [place] [address]    Culture   Final    NO GROWTH 5 DAYS Performed at [facility], [address] [place], [place] [address]    Report Status 11/04/2023 FINAL  Final  Culture, blood (routine x 2) Call MD if unable to obtain prior to antibiotics being given     Status: None   Collection Time: 10/30/23  5:28 AM   Specimen: BLOOD RIGHT HAND  Result Value Ref Range Status   Specimen Description   Final    BLOOD RIGHT HAND Performed at [facility], [address] [place], [place] [address]    Special Requests   Final    BOTTLES DRAWN AEROBIC AND ANAEROBIC Blood Culture results may not be optimal due to an inadequate volume of blood received in culture bottles Performed at [facility], [address], [place], [place] [address]    Culture   Final    NO GROWTH 5 DAYS Performed at [facility], [address], [place], [place] [address]    Report Status 11/04/2023 FINAL  Final    Labs: CBC: No results for input(s): WBC, NEUTROABS, HGB, HCT, MCV, PLT in the last 168 hours. Basic Metabolic Panel: No results for input(s): NA, K, CL, CO2, GLUCOSE, BUN, CREATININE, CALCIUM , MG, PHOS in the last 168 hours. Liver Function Tests: No results for input(s): AST, ALT, ALKPHOS, BILITOT, PROT, ALBUMIN  in the last 168 hours. CBG: Recent Labs  Lab 11/23/23 2051 11/24/23 0811 11/24/23 1646 11/25/23 1644 11/25/23 2105  GLUCAP 167* 153* 204* 173* 236*    Discharge time spent: greater than 30 minutes.  Signed: Nena Rebel, MD [facility] 11/26/2023

## 2023-11-26 NOTE — Plan of Care (Signed)
 Problem: Education: Goal: Ability to describe self-care measures that may prevent or decrease complications (Diabetes Survival Skills Education) will improve 11/26/2023 1238 by Claudene Graylin ORN, RN Outcome: Adequate for Discharge 11/26/2023 1238 by Claudene Graylin ORN, RN Outcome: Adequate for Discharge Goal: Individualized Educational Video(s) 11/26/2023 1238 by Claudene Graylin ORN, RN Outcome: Adequate for Discharge 11/26/2023 1238 by Claudene Graylin ORN, RN Outcome: Adequate for Discharge   Problem: Education: Goal: Individualized Educational Video(s) 11/26/2023 1238 by Claudene Graylin ORN, RN Outcome: Adequate for Discharge 11/26/2023 1238 by Claudene Graylin ORN, RN Outcome: Adequate for Discharge   Problem: Education: Goal: Individualized Educational Video(s) 11/26/2023 1238 by Claudene Graylin ORN, RN Outcome: Adequate for Discharge 11/26/2023 1238 by Claudene Graylin ORN, RN Outcome: Adequate for Discharge   Problem: Coping: Goal: Ability to adjust to condition or change in health will improve 11/26/2023 1238 by Claudene Graylin ORN, RN Outcome: Adequate for Discharge 11/26/2023 1238 by Claudene Graylin ORN, RN Outcome: Adequate for Discharge   Problem: Fluid Volume: Goal: Ability to maintain a balanced intake and output will improve 11/26/2023 1238 by Claudene Graylin ORN, RN Outcome: Adequate for Discharge 11/26/2023 1238 by Claudene Graylin ORN, RN Outcome: Adequate for Discharge   Problem: Health Behavior/Discharge Planning: Goal: Ability to identify and utilize available resources and services will improve 11/26/2023 1238 by Claudene Graylin ORN, RN Outcome: Adequate for Discharge 11/26/2023 1238 by Claudene Graylin ORN, RN Outcome: Adequate for Discharge Goal: Ability to manage health-related needs will improve 11/26/2023 1238 by Claudene Graylin ORN, RN Outcome: Adequate for Discharge 11/26/2023 1238 by Claudene Graylin ORN, RN Outcome: Adequate for Discharge   Problem: Nutritional: Goal: Maintenance of adequate nutrition will  improve 11/26/2023 1238 by Claudene Graylin ORN, RN Outcome: Adequate for Discharge 11/26/2023 1238 by Claudene Graylin ORN, RN Outcome: Adequate for Discharge Goal: Progress toward achieving an optimal weight will improve 11/26/2023 1238 by Claudene Graylin ORN, RN Outcome: Adequate for Discharge 11/26/2023 1238 by Claudene Graylin ORN, RN Outcome: Adequate for Discharge   Problem: Skin Integrity: Goal: Risk for impaired skin integrity will decrease 11/26/2023 1238 by Claudene Graylin ORN, RN Outcome: Adequate for Discharge 11/26/2023 1238 by Claudene Graylin ORN, RN Outcome: Adequate for Discharge   Problem: Tissue Perfusion: Goal: Adequacy of tissue perfusion will improve 11/26/2023 1238 by Claudene Graylin ORN, RN Outcome: Adequate for Discharge 11/26/2023 1238 by Claudene Graylin ORN, RN Outcome: Adequate for Discharge   Problem: Education: Goal: Ability to demonstrate management of disease process will improve 11/26/2023 1238 by Claudene Graylin ORN, RN Outcome: Adequate for Discharge 11/26/2023 1238 by Claudene Graylin ORN, RN Outcome: Adequate for Discharge Goal: Ability to verbalize understanding of medication therapies will improve 11/26/2023 1238 by Claudene Graylin ORN, RN Outcome: Adequate for Discharge 11/26/2023 1238 by Claudene Graylin ORN, RN Outcome: Adequate for Discharge Goal: Individualized Educational Video(s) 11/26/2023 1238 by Claudene Graylin ORN, RN Outcome: Adequate for Discharge 11/26/2023 1238 by Claudene Graylin ORN, RN Outcome: Adequate for Discharge   Problem: Cardiac: Goal: Ability to achieve and maintain adequate cardiopulmonary perfusion will improve 11/26/2023 1238 by Claudene Graylin ORN, RN Outcome: Adequate for Discharge 11/26/2023 1238 by Claudene Graylin ORN, RN Outcome: Adequate for Discharge   Problem: Activity: Goal: Capacity to carry out activities will improve 11/26/2023 1238 by Claudene Graylin ORN, RN Outcome: Adequate for Discharge 11/26/2023 1238 by Claudene Graylin ORN, RN Outcome: Adequate for Discharge   Problem:  Activity: Goal: Ability to tolerate increased activity will improve 11/26/2023 1238 by Claudene Graylin ORN, RN Outcome: Adequate for Discharge 11/26/2023 1238 by  Claudene Graylin ORN, RN Outcome: Adequate for Discharge   Problem: Clinical Measurements: Goal: Ability to maintain a body temperature in the normal range will improve 11/26/2023 1238 by Claudene Graylin ORN, RN Outcome: Adequate for Discharge 11/26/2023 1238 by Claudene Graylin ORN, RN Outcome: Adequate for Discharge   Problem: Education: Goal: Knowledge of General Education information will improve Description: Including pain rating scale, medication(s)/side effects and non-pharmacologic comfort measures 11/26/2023 1238 by Claudene Graylin ORN, RN Outcome: Adequate for Discharge 11/26/2023 1238 by Claudene Graylin ORN, RN Outcome: Adequate for Discharge   Problem: Health Behavior/Discharge Planning: Goal: Ability to manage health-related needs will improve 11/26/2023 1238 by Claudene Graylin ORN, RN Outcome: Adequate for Discharge 11/26/2023 1238 by Claudene Graylin ORN, RN Outcome: Adequate for Discharge   Problem: Clinical Measurements: Goal: Ability to maintain clinical measurements within normal limits will improve 11/26/2023 1238 by Claudene Graylin ORN, RN Outcome: Adequate for Discharge 11/26/2023 1238 by Claudene Graylin ORN, RN Outcome: Adequate for Discharge Goal: Will remain free from infection 11/26/2023 1238 by Claudene Graylin ORN, RN Outcome: Adequate for Discharge 11/26/2023 1238 by Claudene Graylin ORN, RN Outcome: Adequate for Discharge Goal: Diagnostic test results will improve 11/26/2023 1238 by Claudene Graylin ORN, RN Outcome: Adequate for Discharge 11/26/2023 1238 by Claudene Graylin ORN, RN Outcome: Adequate for Discharge Goal: Respiratory complications will improve 11/26/2023 1238 by Claudene Graylin ORN, RN Outcome: Adequate for Discharge 11/26/2023 1238 by Claudene Graylin ORN, RN Outcome: Adequate for Discharge Goal: Cardiovascular complication will be avoided 11/26/2023  1238 by Claudene Graylin ORN, RN Outcome: Adequate for Discharge 11/26/2023 1238 by Claudene Graylin ORN, RN Outcome: Adequate for Discharge   Problem: Activity: Goal: Risk for activity intolerance will decrease 11/26/2023 1238 by Claudene Graylin ORN, RN Outcome: Adequate for Discharge 11/26/2023 1238 by Claudene Graylin ORN, RN Outcome: Adequate for Discharge   Problem: Nutrition: Goal: Adequate nutrition will be maintained 11/26/2023 1238 by Claudene Graylin ORN, RN Outcome: Adequate for Discharge 11/26/2023 1238 by Claudene Graylin ORN, RN Outcome: Adequate for Discharge   Problem: Elimination: Goal: Will not experience complications related to bowel motility 11/26/2023 1238 by Claudene Graylin ORN, RN Outcome: Adequate for Discharge 11/26/2023 1238 by Claudene Graylin ORN, RN Outcome: Adequate for Discharge Goal: Will not experience complications related to urinary retention 11/26/2023 1238 by Claudene Graylin ORN, RN Outcome: Adequate for Discharge 11/26/2023 1238 by Claudene Graylin ORN, RN Outcome: Adequate for Discharge   Problem: Safety: Goal: Ability to remain free from injury will improve 11/26/2023 1238 by Claudene Graylin ORN, RN Outcome: Adequate for Discharge 11/26/2023 1238 by Claudene Graylin ORN, RN Outcome: Adequate for Discharge   Problem: Skin Integrity: Goal: Risk for impaired skin integrity will decrease 11/26/2023 1238 by Claudene Graylin ORN, RN Outcome: Adequate for Discharge 11/26/2023 1238 by Claudene Graylin ORN, RN Outcome: Adequate for Discharge   Problem: Skin Integrity: Goal: Risk for impaired skin integrity will decrease 11/26/2023 1238 by Claudene Graylin ORN, RN Outcome: Adequate for Discharge 11/26/2023 1238 by Claudene Graylin ORN, RN Outcome: Adequate for Discharge

## 2023-11-26 NOTE — TOC Progression Note (Signed)
 Transition of Care [facility]) - Progression Note    Patient Details  Name: David [name] MRN: [id number] Date of Birth: 1946/02/14  Transition of Care [facility]) CM/SW Contact  Doneta Glenys DASEN, RN Phone Number: 11/26/2023, 9:27 AM  Clinical Narrative:    CM spoke with Leonidas Christians. CM informed MD that patient can be discharged today and please complete discharge summary. I have to send to [facility]. CM ask Graylin LELON Sharps, RN Please call report to [facility] [phone] Rm [phone]. Will let her know when I call [facility] for transport.    Expected Discharge Plan: Skilled Nursing Facility Barriers to Discharge: Barriers Resolved  Expected Discharge Plan and Services In-house Referral: NA Discharge Planning Services: CM Consult Post Acute Care Choice: NA Living arrangements for the past 2 months: Skilled Nursing Facility Expected Discharge Date: 11/19/23               DME Arranged: Hospital bed, Other see comment, Oxygen [facility]) DME Agency: [employer] Date DME Agency Contacted: 11/19/23 Time DME Agency Contacted: [phone] Representative spoke with at DME Agency: [place] HH Arranged: RN, PT, OT, Nurse's Aide, Social Work [employer] Agency: [facility] Date HH Agency Contacted: 11/18/23 Time HH Agency Contacted: 0845 Representative spoke with at [facility] Agency: [place]   Social Determinants of Health (SDOH) Interventions SDOH Screenings   Food Insecurity: No Food Insecurity (10/30/2023)  Housing: Low Risk  (10/30/2023)  Transportation Needs: No Transportation Needs (10/30/2023)  Utilities: Not At Risk (10/30/2023)  Alcohol Screen: Low Risk  (05/18/2023)  Financial Resource Strain: Low Risk  (05/18/2023)  Physical Activity: Inactive (08/20/2017)  Social Connections: Socially Integrated (10/30/2023)  Stress: No Stress Concern Present (08/20/2017)  Tobacco Use: Medium Risk (11/07/2023)    Readmission Risk Interventions    11/09/2023   11:30 AM 11/06/2023    8:44 AM   Readmission Risk Prevention Plan  Transportation Screening Complete Complete  Medication Review (RN Care Manager) Complete Complete  PCP or Specialist appointment within 3-5 days of discharge Complete   HRI or Home Care Consult Complete Complete  SW Recovery Care/Counseling Consult Complete Complete  Palliative Care Screening Not Applicable Not Applicable  Skilled Nursing Facility Complete Not Applicable

## 2023-11-27 ENCOUNTER — Telehealth: Payer: Self-pay | Admitting: Cardiology

## 2023-11-27 ENCOUNTER — Other Ambulatory Visit (HOSPITAL_COMMUNITY): Payer: Self-pay

## 2023-11-27 NOTE — Telephone Encounter (Signed)
 Pt c/o medication issue:  1. Name of Medication:  atorvastatin  (LIPITOR ) 80 MG tablet   2. How are you currently taking this medication (dosage and times per day)?   3. Are you having a reaction (difficulty breathing--STAT)?   4. What is your medication issue?   Damien, Pharmacist with [facility] would like to confirm medication dose. Please advise.  Phone#: [phone]

## 2023-11-27 NOTE — Telephone Encounter (Signed)
 [name], David, [facility] to Me (Selected Message)     11/27/23  5:13 PM Appears to be 80mg  once daily at bedtime.   Called the pharmacy and left a message verifying this medication dose is correct

## 2023-11-30 ENCOUNTER — Other Ambulatory Visit (HOSPITAL_COMMUNITY): Payer: Self-pay

## 2023-12-03 NOTE — Progress Notes (Deleted)
 Patient ID: David [name], male   DOB: Dec 09, 1945, 78 y.o.   MRN: [id number]  Reason for Consult: No chief complaint on file.   Referred by Rexanne Ingle, MD  Subjective:     HPI David [name] is a 78 y.o. male presenting for evaluation of foot wounds. ***  Past Medical History:  Diagnosis Date   Anemia    Arthritis    CAD in native artery    s/p stent in 11/18   CHF (congestive heart failure) (HCC)    normal echo in 11/18   CKD (chronic kidney disease) stage 3, GFR 30-59 ml/min (HCC)    DDD (degenerative disc disease), cervical    Diabetes mellitus with complication (HCC)    Type II   Diabetic neuropathy (HCC)    Diabetic retinopathy (HCC)    GERD (gastroesophageal reflux disease)    Glaucoma    Hypertension    Family History  Problem Relation Age of Onset   Diabetes Other    Hyperlipidemia Other    Hypertension Other    Stroke Other    Alzheimer's disease Other    Thyroid  disease Mother    Diabetes Mellitus II Father    Alzheimer's disease Father    Past Surgical History:  Procedure Laterality Date   AMPUTATION Right 07/09/2016   Procedure: Right Great Toe Amputation at Metatarsophalangeal Joint;  Surgeon: Jerona Harden GAILS, MD;  Location: [facility] OR;  Service: Orthopedics;  Laterality: Right;   AMPUTATION Right 05/28/2018   Procedure: RIGHT SECOND TOE AMPUTATION;  Surgeon: Harden Jerona GAILS, MD;  Location: [facility] OR;  Service: Orthopedics;  Laterality: Right;   AMPUTATION Left 08/03/2020   Procedure: LEFT 5TH RAY AMPUTATION;  Surgeon: Harden Jerona GAILS, MD;  Location: [facility] OR;  Service: Orthopedics;  Laterality: Left;   BACK SURGERY     4   CARDIAC CATHETERIZATION     CORONARY STENT INTERVENTION N/A 04/06/2017   Procedure: CORONARY STENT INTERVENTION;  Surgeon: Elmira Newman PARAS, MD;  Location: [facility];  Service: Cardiovascular;  Laterality: N/A;   CORONARY STENT INTERVENTION N/A 04/07/2017   Procedure: CORONARY STENT INTERVENTION;  Surgeon: Elmira Newman PARAS, MD;  Location: [facility];  Service: Cardiovascular;  Laterality: N/A;   CYST EXCISION     on Back   ENDOTRACHEAL INTUBATION EMERGENT  08/07/2018       ESOPHAGOGASTRODUODENOSCOPY Left 11/07/2023   Procedure: EGD (ESOPHAGOGASTRODUODENOSCOPY);  Surgeon: Rosalie Kitchens, MD;  Location: [facility];  Service: Gastroenterology;  Laterality: Left;   JOINT REPLACEMENT     LAPAROSCOPIC ABDOMINAL EXPLORATION N/A 11/26/2017   Procedure: LAPAROSCOPIC ABDOMINAL EXPLORATION, DRAINAGE OF APPENDICEAL ABCESS. PLACEMENT OF DRAIN;  Surgeon: Ethyl Lenis, MD;  Location: [facility] OR;  Service: General;  Laterality: N/A;   RIGHT/LEFT HEART CATH AND CORONARY ANGIOGRAPHY N/A 04/06/2017   Procedure: RIGHT/LEFT HEART CATH AND CORONARY ANGIOGRAPHY;  Surgeon: Elmira Newman PARAS, MD;  Location: [facility];  Service: Cardiovascular;  Laterality: N/A;   TOE SURGERY  05/2018   Left    TOTAL HIP ARTHROPLASTY     Right     Short Social History:  Social History   Tobacco Use   Smoking status: Former    Current packs/day: 0.00    Average packs/day: 0.3 packs/day for 5.0 years (1.3 ttl pk-yrs)    Types: Cigarettes    Start date: 49    Quit date: 1996    Years since quitting: 29.5    Passive exposure: Past  Smokeless tobacco: Never  Substance Use Topics   Alcohol use: Not Currently    Comment: occ    Allergies  Allergen Reactions   Nsaids Other (See Comments)    CKD stage 3     Current Outpatient Medications  Medication Sig Dispense Refill   acetaminophen  (TYLENOL ) 500 MG tablet Take 2 tablets (1,000 mg total) by mouth in the morning and at bedtime. 120 tablet 0   albuterol  (VENTOLIN  HFA) 108 (90 Base) MCG/ACT inhaler Inhale 2 puffs into the lungs every 6 (six) hours as needed for wheezing or shortness of breath. 1 each 2   albuterol  (VENTOLIN  HFA) 108 (90 Base) MCG/ACT inhaler Inhale 2 puffs into the lungs every 6 (six) hours as needed for wheezing or shortness of breath.. 6.7 g 2    [Paused] amLODipine  (NORVASC ) 10 MG tablet Take 10 mg by mouth daily.     aspirin  EC 81 MG tablet Take 1 tablet (81 mg total) by mouth daily. 30 tablet 0   atorvastatin  (LIPITOR ) 80 MG tablet Take 1 tablet (80 mg total) by mouth at bedtime. 30 tablet 0   bisacodyl  (DULCOLAX) 10 MG suppository Place 1 suppository (10 mg total) rectally daily as needed for moderate constipation. 12 suppository 0   cholecalciferol  (VITAMIN D3) 25 MCG (1000 UNIT) tablet Take 5 tablets (5,000 Units total) by mouth daily. 150 tablet 0   cyanocobalamin  (VITAMIN B12) 1000 MCG tablet Take 2 tablets (2,000 mcg total) by mouth daily. 130 tablet 0   dextromethorphan -guaiFENesin  (MUCINEX  DM) 30-600 MG 12hr tablet Take 1 tablet by mouth 2 (two) times daily. 60 tablet 0   dicyclomine  (BENTYL ) 10 MG capsule Take 1 capsule (10 mg total) by mouth in the morning, at noon, in the evening, and at bedtime. 120 capsule 0   docusate sodium  (COLACE) 100 MG capsule Take 1 capsule (100 mg total) by mouth 2 (two) times daily. 100 capsule 0   ferrous sulfate  325 (65 FE) MG tablet Take 1 tablet (325 mg total) by mouth daily with breakfast. 100 tablet 0   gabapentin  (NEURONTIN ) 100 MG capsule Take 2 capsules (200 mg total) by mouth 3 (three) times daily. 180 capsule 0   insulin  aspart (NOVOLOG ) 100 UNIT/ML injection Inject 0-6 Units into the skin 3 (three) times daily with meals. 10 mL 0   isosorbide -hydrALAZINE  (BIDIL ) 20-37.5 MG tablet Take 2 tablets by mouth three times daily. 540 tablet 0   LANTUS  SOLOSTAR 100 UNIT/ML Solostar Pen Inject 12 Units into the skin daily. 15 mL 0   latanoprost  (XALATAN ) 0.005 % ophthalmic solution Place 1 drop into both eyes at bedtime. 2.5 mL 0   leptospermum manuka honey (MEDIHONEY) PSTE paste Apply 1 Application topically daily. Apply to buttocks and left heel as directed 44 mL 0   lubiprostone  (AMITIZA ) 24 MCG capsule Take 1 capsule (24 mcg total) by mouth daily. 30 capsule 0   melatonin 3 MG TABS tablet Take  1 tablet (3 mg total) by mouth at bedtime. 30 tablet 0   methocarbamol  (ROBAXIN ) 500 MG tablet Take 1 tablet (500 mg total) by mouth every 6 (six) hours as needed for muscle spasms. 60 tablet 0   metoCLOPramide  (REGLAN ) 5 MG tablet Take 1 tablet (5 mg total) by mouth 3 (three) times daily. 90 tablet 0   [Paused] metoprolol  succinate (TOPROL -XL) 100 MG 24 hr tablet Take 100 mg by mouth daily.     Multiple Vitamin (MULTIVITAMIN WITH MINERALS) TABS tablet Take 1 tablet by mouth daily. 30  tablet 0   Multiple Vitamins-Minerals (CERTAVITE SENIOR) TABS Take 1 tablet by mouth daily. 90 tablet 0   nitroGLYCERIN  (NITROSTAT ) 0.4 MG SL tablet Place 1 tablet (0.4 mg total) under the tongue every 5 (five) minutes as needed for chest pain. 30 tablet 0   ondansetron  (ZOFRAN ) 4 MG tablet Take 1 tablet (4 mg total) by mouth every 6 (six) hours as needed for nausea or vomiting. 20 tablet 0   OXYGEN Inhale 3 L/min into the lungs continuous.     pantoprazole  (PROTONIX ) 40 MG tablet Take 1 tablet (40 mg total) by mouth daily. 30 tablet 0   polyethylene glycol powder (GLYCOLAX /MIRALAX ) 17 GM/SCOOP powder Take 17 g by mouth daily. PRN order: 17 g by mouth daily as needed for constipation 510 g 0   pyridoxine  (B-6) 100 MG tablet Take 1 tablet (100 mg total) by mouth daily. 100 tablet 0   sertraline  (ZOLOFT ) 25 MG tablet Take 1 tablet (25 mg total) by mouth daily. 30 tablet 0   sodium chloride  (OCEAN) 0.65 % SOLN nasal spray Place 1 spray into both nostrils as needed for congestion. 60 mL 0   torsemide  (DEMADEX ) 20 MG tablet Take 1 tablet (20 mg total) by mouth daily. 30 tablet 0   traMADol  (ULTRAM ) 50 MG tablet Take 1 tablet (50 mg total) by mouth 3 (three) times daily as needed for moderate pain (pain score 4-6) or severe pain (pain score 7-10). 15 tablet 0   traZODone  (DESYREL ) 100 MG tablet Take 1 tablet (100 mg total) by mouth at bedtime. 30 tablet 0   UNABLE TO FIND Take 30 mLs by mouth in the morning and at bedtime.  Med Name: protein liquid     No current facility-administered medications for this visit.    REVIEW OF SYSTEMS  All other systems were reviewed and are negative     Objective:  Objective   There were no vitals filed for this visit. There is no height or weight on file to calculate BMI.  Physical Exam General: no acute distress Cardiac: hemodynamically stable Pulm: normal work of breathing Abdomen: non-tender, no pulsatile mass*** Neuro: alert, no focal deficit Extremities: no edema, cyanosis or wounds*** Vascular:   Right: ***  Left: ***  Data: ABI +---------+------------------+-----+---------+----------+  Right   Rt Pressure (mmHg)IndexWaveform Comment     +---------+------------------+-----+---------+----------+  Brachial 147                    triphasic            +---------+------------------+-----+---------+----------+  PTA     182               1.24 biphasic             +---------+------------------+-----+---------+----------+  DP      176               1.20 biphasic             +---------+------------------+-----+---------+----------+  Great Toe                                amputation  +---------+------------------+-----+---------+----------+   +---------+------------------+-----+---------+--------+  Left    Lt Pressure (mmHg)IndexWaveform Comment   +---------+------------------+-----+---------+--------+  Brachial 145                    triphasic          +---------+------------------+-----+---------+--------+  PTA  106               0.72 biphasic           +---------+------------------+-----+---------+--------+  DP      92                0.63 biphasic           +---------+------------------+-----+---------+--------+  Great Toe                       dampened bandages  +---------+------------------+-----+---------+--------+   +-------+-----------+-----------------+------------+------------+   ABI/TBIToday's ABIToday's TBI      Previous ABIPrevious TBI  +-------+-----------+-----------------+------------+------------+  Right 1.24       amputation       1.08        amputation    +-------+-----------+-----------------+------------+------------+  Left  0.72       dampened waveform0.63        0.87          +-------+-----------+-----------------+------------+------------+      Assessment/Plan:   QUNICY HIGINBOTHAM is a 78 y.o. male with ***  Recommendations to optimize cardiovascular risk: Abstinence from all tobacco products. Blood glucose control with goal A1c < 7%. Blood pressure control with goal blood pressure < 140/90 mmHg. Lipid reduction therapy with goal LDL-C <100 mg/dL  Aspirin  81mg  PO QD.  Atorvastatin  40-80mg  PO QD (or other high intensity statin therapy).   Norman GORMAN Serve MD [facility]

## 2023-12-04 ENCOUNTER — Ambulatory Visit: Attending: Vascular Surgery | Admitting: Vascular Surgery

## 2023-12-04 ENCOUNTER — Other Ambulatory Visit: Payer: Self-pay

## 2024-01-01 ENCOUNTER — Other Ambulatory Visit: Payer: Self-pay

## 2024-01-01 ENCOUNTER — Inpatient Hospital Stay (HOSPITAL_COMMUNITY)
Admission: EM | Admit: 2024-01-01 | Discharge: 2024-01-07 | DRG: 682 | Disposition: A | Discharge status: Skilled Nursing Facility | Source: Skilled Nursing Facility | Attending: Internal Medicine | Admitting: Internal Medicine

## 2024-01-01 ENCOUNTER — Encounter (HOSPITAL_COMMUNITY): Payer: Self-pay

## 2024-01-01 DIAGNOSIS — Z794 Long term (current) use of insulin: Secondary | ICD-10-CM

## 2024-01-01 DIAGNOSIS — N184 Chronic kidney disease, stage 4 (severe): Secondary | ICD-10-CM | POA: Diagnosis present

## 2024-01-01 DIAGNOSIS — Z886 Allergy status to analgesic agent status: Secondary | ICD-10-CM

## 2024-01-01 DIAGNOSIS — Z79899 Other long term (current) drug therapy: Secondary | ICD-10-CM

## 2024-01-01 DIAGNOSIS — F411 Generalized anxiety disorder: Secondary | ICD-10-CM | POA: Diagnosis present

## 2024-01-01 DIAGNOSIS — E11621 Type 2 diabetes mellitus with foot ulcer: Secondary | ICD-10-CM | POA: Diagnosis present

## 2024-01-01 DIAGNOSIS — Z955 Presence of coronary angioplasty implant and graft: Secondary | ICD-10-CM

## 2024-01-01 DIAGNOSIS — E119 Type 2 diabetes mellitus without complications: Secondary | ICD-10-CM

## 2024-01-01 DIAGNOSIS — E11628 Type 2 diabetes mellitus with other skin complications: Secondary | ICD-10-CM | POA: Diagnosis present

## 2024-01-01 DIAGNOSIS — Z823 Family history of stroke: Secondary | ICD-10-CM

## 2024-01-01 DIAGNOSIS — Z8249 Family history of ischemic heart disease and other diseases of the circulatory system: Secondary | ICD-10-CM

## 2024-01-01 DIAGNOSIS — K289 Gastrojejunal ulcer, unspecified as acute or chronic, without hemorrhage or perforation: Secondary | ICD-10-CM | POA: Insufficient documentation

## 2024-01-01 DIAGNOSIS — N189 Chronic kidney disease, unspecified: Secondary | ICD-10-CM | POA: Diagnosis present

## 2024-01-01 DIAGNOSIS — L97829 Non-pressure chronic ulcer of other part of left lower leg with unspecified severity: Secondary | ICD-10-CM | POA: Diagnosis present

## 2024-01-01 DIAGNOSIS — I130 Hypertensive heart and chronic kidney disease with heart failure and stage 1 through stage 4 chronic kidney disease, or unspecified chronic kidney disease: Secondary | ICD-10-CM | POA: Diagnosis present

## 2024-01-01 DIAGNOSIS — G629 Polyneuropathy, unspecified: Secondary | ICD-10-CM

## 2024-01-01 DIAGNOSIS — Z833 Family history of diabetes mellitus: Secondary | ICD-10-CM

## 2024-01-01 DIAGNOSIS — K219 Gastro-esophageal reflux disease without esophagitis: Secondary | ICD-10-CM | POA: Diagnosis present

## 2024-01-01 DIAGNOSIS — Z87891 Personal history of nicotine dependence: Secondary | ICD-10-CM

## 2024-01-01 DIAGNOSIS — J029 Acute pharyngitis, unspecified: Secondary | ICD-10-CM | POA: Diagnosis present

## 2024-01-01 DIAGNOSIS — N179 Acute kidney failure, unspecified: Principal | ICD-10-CM | POA: Diagnosis present

## 2024-01-01 DIAGNOSIS — E1142 Type 2 diabetes mellitus with diabetic polyneuropathy: Secondary | ICD-10-CM | POA: Diagnosis present

## 2024-01-01 DIAGNOSIS — Z89411 Acquired absence of right great toe: Secondary | ICD-10-CM

## 2024-01-01 DIAGNOSIS — E1122 Type 2 diabetes mellitus with diabetic chronic kidney disease: Secondary | ICD-10-CM | POA: Diagnosis present

## 2024-01-01 DIAGNOSIS — L89153 Pressure ulcer of sacral region, stage 3: Secondary | ICD-10-CM | POA: Diagnosis present

## 2024-01-01 DIAGNOSIS — Z89421 Acquired absence of other right toe(s): Secondary | ICD-10-CM

## 2024-01-01 DIAGNOSIS — R6339 Other feeding difficulties: Secondary | ICD-10-CM

## 2024-01-01 DIAGNOSIS — I5042 Chronic combined systolic (congestive) and diastolic (congestive) heart failure: Secondary | ICD-10-CM | POA: Diagnosis present

## 2024-01-01 DIAGNOSIS — Z8349 Family history of other endocrine, nutritional and metabolic diseases: Secondary | ICD-10-CM

## 2024-01-01 DIAGNOSIS — Z6824 Body mass index (BMI) 24.0-24.9, adult: Secondary | ICD-10-CM

## 2024-01-01 DIAGNOSIS — Z820 Family history of epilepsy and other diseases of the nervous system: Secondary | ICD-10-CM

## 2024-01-01 DIAGNOSIS — H409 Unspecified glaucoma: Secondary | ICD-10-CM | POA: Diagnosis present

## 2024-01-01 DIAGNOSIS — D631 Anemia in chronic kidney disease: Secondary | ICD-10-CM | POA: Diagnosis present

## 2024-01-01 DIAGNOSIS — L97921 Non-pressure chronic ulcer of unspecified part of left lower leg limited to breakdown of skin: Secondary | ICD-10-CM | POA: Diagnosis present

## 2024-01-01 DIAGNOSIS — I2510 Atherosclerotic heart disease of native coronary artery without angina pectoris: Secondary | ICD-10-CM | POA: Diagnosis present

## 2024-01-01 DIAGNOSIS — R627 Adult failure to thrive: Secondary | ICD-10-CM | POA: Diagnosis present

## 2024-01-01 DIAGNOSIS — K3184 Gastroparesis: Secondary | ICD-10-CM | POA: Diagnosis present

## 2024-01-01 DIAGNOSIS — Z7982 Long term (current) use of aspirin: Secondary | ICD-10-CM

## 2024-01-01 DIAGNOSIS — Z96641 Presence of right artificial hip joint: Secondary | ICD-10-CM | POA: Diagnosis present

## 2024-01-01 DIAGNOSIS — H9202 Otalgia, left ear: Secondary | ICD-10-CM | POA: Diagnosis present

## 2024-01-01 DIAGNOSIS — Z8679 Personal history of other diseases of the circulatory system: Secondary | ICD-10-CM

## 2024-01-01 DIAGNOSIS — E1143 Type 2 diabetes mellitus with diabetic autonomic (poly)neuropathy: Secondary | ICD-10-CM | POA: Diagnosis present

## 2024-01-01 DIAGNOSIS — K5909 Other constipation: Secondary | ICD-10-CM | POA: Diagnosis present

## 2024-01-01 DIAGNOSIS — Z89422 Acquired absence of other left toe(s): Secondary | ICD-10-CM

## 2024-01-01 DIAGNOSIS — I10 Essential (primary) hypertension: Secondary | ICD-10-CM | POA: Diagnosis present

## 2024-01-01 DIAGNOSIS — Z83438 Family history of other disorder of lipoprotein metabolism and other lipidemia: Secondary | ICD-10-CM

## 2024-01-01 DIAGNOSIS — E860 Dehydration: Secondary | ICD-10-CM | POA: Diagnosis present

## 2024-01-01 DIAGNOSIS — E162 Hypoglycemia, unspecified: Secondary | ICD-10-CM | POA: Insufficient documentation

## 2024-01-01 DIAGNOSIS — E11649 Type 2 diabetes mellitus with hypoglycemia without coma: Secondary | ICD-10-CM | POA: Diagnosis present

## 2024-01-01 DIAGNOSIS — D649 Anemia, unspecified: Secondary | ICD-10-CM | POA: Diagnosis present

## 2024-01-01 DIAGNOSIS — L97819 Non-pressure chronic ulcer of other part of right lower leg with unspecified severity: Secondary | ICD-10-CM | POA: Diagnosis present

## 2024-01-01 LAB — CBC WITH DIFFERENTIAL/PLATELET
Abs Granulocyte: 10.6 K/uL — ABNORMAL HIGH (ref 1.5–6.5)
Abs Immature Granulocytes: 0.06 K/uL (ref 0.00–0.07)
Basophils Absolute: 0 K/uL (ref 0.0–0.1)
Basophils Relative: 0 %
Eosinophils Absolute: 0.1 K/uL (ref 0.0–0.5)
Eosinophils Relative: 1 %
HCT: 23.6 % — ABNORMAL LOW (ref 39.0–52.0)
Hemoglobin: 7.2 g/dL — ABNORMAL LOW (ref 13.0–17.0)
Immature Granulocytes: 1 %
Lymphocytes Relative: 9 %
Lymphs Abs: 1.1 K/uL (ref 0.7–4.0)
MCH: 35.3 pg — ABNORMAL HIGH (ref 26.0–34.0)
MCHC: 30.5 g/dL (ref 30.0–36.0)
MCV: 115.7 fL — ABNORMAL HIGH (ref 80.0–100.0)
Monocytes Absolute: 0.9 K/uL (ref 0.1–1.0)
Monocytes Relative: 7 %
Neutro Abs: 10.6 K/uL — ABNORMAL HIGH (ref 1.7–7.7)
Neutrophils Relative %: 82 %
Platelets: 232 K/uL (ref 150–400)
RBC: 2.04 MIL/uL — ABNORMAL LOW (ref 4.22–5.81)
RDW: 20.7 % — ABNORMAL HIGH (ref 11.5–15.5)
WBC: 12.8 K/uL — ABNORMAL HIGH (ref 4.0–10.5)
nRBC: 0 % (ref 0.0–0.2)

## 2024-01-01 LAB — COMPREHENSIVE METABOLIC PANEL WITH GFR
ALT: 30 U/L (ref 0–44)
AST: 30 U/L (ref 15–41)
Albumin: 2.4 g/dL — ABNORMAL LOW (ref 3.5–5.0)
Alkaline Phosphatase: 97 U/L (ref 38–126)
Anion gap: 9 (ref 5–15)
BUN: 51 mg/dL — ABNORMAL HIGH (ref 8–23)
CO2: 22 mmol/L (ref 22–32)
Calcium: 8.3 mg/dL — ABNORMAL LOW (ref 8.9–10.3)
Chloride: 108 mmol/L (ref 98–111)
Creatinine, Ser: 3.11 mg/dL — ABNORMAL HIGH (ref 0.61–1.24)
GFR, Estimated: 20 mL/min — ABNORMAL LOW (ref >60)
Glucose, Bld: 110 mg/dL — ABNORMAL HIGH (ref 70–99)
Potassium: 4.8 mmol/L (ref 3.5–5.1)
Sodium: 139 mmol/L (ref 135–145)
Total Bilirubin: 0.5 mg/dL (ref 0.0–1.2)
Total Protein: 7.5 g/dL (ref 6.5–8.1)

## 2024-01-01 LAB — CBG MONITORING, ED: Glucose-Capillary: 111 mg/dL — ABNORMAL HIGH (ref 70–99)

## 2024-01-01 MED ORDER — SODIUM CHLORIDE 0.9 % IV BOLUS
1000.0000 mL | Freq: Once | INTRAVENOUS | Status: AC
  Administered 2024-01-01: 1000 mL via INTRAVENOUS

## 2024-01-01 NOTE — ED Provider Notes (Signed)
 [place] EMERGENCY DEPARTMENT AT [facility] Provider Note   CSN: [id number] Arrival date & time: 01/01/24  1754     Patient presents with: Fatigue   David [name] is a 78 y.o. male with an extensive past medical history including diabetes, diabetic retinopathy, neuropathy, glaucoma, hypertension, CHF, CKD, CAD and peripheral arterial disease.  Patient arrives from [facility] assisted living facility.  EMS was called due to lethargy.  Patient was found to be hypoglycemic with CBG of 66.  He is also noted to have hemoglobin of 6.2 on labs drawn recently.  Patient states that his sugar is low because he cannot feed himself and this nursing facility refuses to help him.  He states that he cannot grasp the utensils because of his neuropathy and he cannot see to put the food in his mouth.  He states so I just do not eat.  Patient reports that has been about 5 days since he had a meal.  He is asking for orange juice.  He was given 1 mg of glucagon prior to arrival and has CBG of 80 on arrival.  Patient also noted to have anemia.  He was admitted for the same 2 months ago   With an extensive past medical history including coronary artery disease, CHF, chronic kidney disease, diabetes, diabetic neuropathy, diabetic retinopathy, glaucoma, hypertension,       Prior to Admission medications   Medication Sig Start Date End Date Taking? Authorizing Provider  acetaminophen  (TYLENOL ) 500 MG tablet Take 2 tablets (1,000 mg total) by mouth in the morning and at bedtime. 11/21/23   Barbarann Nest, MD  albuterol  (VENTOLIN  HFA) 108 [phone] Base) MCG/ACT inhaler Inhale 2 puffs into the lungs every 6 (six) hours as needed for wheezing or shortness of breath. 11/21/23   Barbarann Nest, MD  albuterol  (VENTOLIN  HFA) 108 [phone] Base) MCG/ACT inhaler Inhale 2 puffs into the lungs every 6 (six) hours as needed for wheezing or shortness of breath.. 11/21/23   Barbarann Nest, MD  amLODipine  (NORVASC ) 10 MG  tablet Take 10 mg by mouth daily. 05/29/23   [provider]  aspirin  EC 81 MG tablet Take 1 tablet (81 mg total) by mouth daily. 11/21/23   Barbarann Nest, MD  atorvastatin  (LIPITOR ) 80 MG tablet Take 1 tablet (80 mg total) by mouth at bedtime. 11/21/23   Barbarann Nest, MD  bisacodyl  (DULCOLAX) 10 MG suppository Place 1 suppository (10 mg total) rectally daily as needed for moderate constipation. 11/21/23   Barbarann Nest, MD  cholecalciferol  (VITAMIN D3) 25 MCG (1000 UNIT) tablet Take 5 tablets (5,000 Units total) by mouth daily. 11/21/23   Barbarann Nest, MD  cyanocobalamin  (VITAMIN B12) 1000 MCG tablet Take 2 tablets (2,000 mcg total) by mouth daily. 11/21/23   Barbarann Nest, MD  dextromethorphan -guaiFENesin  (MUCINEX  DM) 30-600 MG 12hr tablet Take 1 tablet by mouth 2 (two) times daily. 11/21/23   Barbarann Nest, MD  dicyclomine  (BENTYL ) 10 MG capsule Take 1 capsule (10 mg total) by mouth in the morning, at noon, in the evening, and at bedtime. 11/21/23   Barbarann Nest, MD  docusate sodium  (COLACE) 100 MG capsule Take 1 capsule (100 mg total) by mouth 2 (two) times daily. 11/21/23   Barbarann Nest, MD  ferrous sulfate  325 (65 FE) MG tablet Take 1 tablet (325 mg total) by mouth daily with breakfast. 11/21/23   Barbarann Nest, MD  gabapentin  (NEURONTIN ) 100 MG capsule Take 2 capsules (200 mg total) by mouth 3 (three) times  daily. 11/21/23   Barbarann Nest, MD  insulin  aspart (NOVOLOG ) 100 UNIT/ML injection Inject 0-6 Units into the skin 3 (three) times daily with meals. 11/21/23   Barbarann Nest, MD  isosorbide -hydrALAZINE  (BIDIL ) 20-37.5 MG tablet Take 2 tablets by mouth three times daily. 11/25/23   Patwardhan, Newman PARAS, MD  LANTUS  SOLOSTAR 100 UNIT/ML Solostar Pen Inject 12 Units into the skin daily. 11/21/23   Barbarann Nest, MD  latanoprost  (XALATAN ) 0.005 % ophthalmic solution Place 1 drop into both eyes at bedtime. 11/21/23   Barbarann Nest, MD  leptospermum manuka honey (MEDIHONEY) PSTE paste  Apply 1 Application topically daily. Apply to buttocks and left heel as directed 11/21/23   Barbarann Nest, MD  lubiprostone  (AMITIZA ) 24 MCG capsule Take 1 capsule (24 mcg total) by mouth daily. 11/21/23   Barbarann Nest, MD  melatonin 3 MG TABS tablet Take 1 tablet (3 mg total) by mouth at bedtime. 11/21/23   Barbarann Nest, MD  methocarbamol  (ROBAXIN ) 500 MG tablet Take 1 tablet (500 mg total) by mouth every 6 (six) hours as needed for muscle spasms. 11/21/23   Barbarann Nest, MD  metoCLOPramide  (REGLAN ) 5 MG tablet Take 1 tablet (5 mg total) by mouth 3 (three) times daily. 11/21/23 11/20/24  Barbarann Nest, MD  metoprolol  succinate (TOPROL -XL) 100 MG 24 hr tablet Take 100 mg by mouth daily. 05/29/23   [provider]  Multiple Vitamin (MULTIVITAMIN WITH MINERALS) TABS tablet Take 1 tablet by mouth daily. 11/21/23   Barbarann Nest, MD  Multiple Vitamins-Minerals (CERTAVITE SENIOR) TABS Take 1 tablet by mouth daily. 11/21/23   Barbarann Nest, MD  nitroGLYCERIN  (NITROSTAT ) 0.4 MG SL tablet Place 1 tablet (0.4 mg total) under the tongue every 5 (five) minutes as needed for chest pain. 11/21/23   Barbarann Nest, MD  ondansetron  (ZOFRAN ) 4 MG tablet Take 1 tablet (4 mg total) by mouth every 6 (six) hours as needed for nausea or vomiting. 11/21/23   Barbarann Nest, MD  OXYGEN Inhale 3 L/min into the lungs continuous.    [provider]  pantoprazole  (PROTONIX ) 40 MG tablet Take 1 tablet (40 mg total) by mouth daily. 11/21/23   Barbarann Nest, MD  polyethylene glycol powder (GLYCOLAX /MIRALAX ) 17 GM/SCOOP powder Take 17 g by mouth daily. PRN order: 17 g by mouth daily as needed for constipation 11/21/23   Barbarann Nest, MD  pyridoxine  (B-6) 100 MG tablet Take 1 tablet (100 mg total) by mouth daily. 11/21/23   Barbarann Nest, MD  sertraline  (ZOLOFT ) 25 MG tablet Take 1 tablet (25 mg total) by mouth daily. 11/21/23   Barbarann Nest, MD  sodium chloride  (OCEAN) 0.65 % SOLN nasal spray Place 1 spray into  both nostrils as needed for congestion. 11/21/23   Barbarann Nest, MD  torsemide  (DEMADEX ) 20 MG tablet Take 1 tablet (20 mg total) by mouth daily. 11/21/23   Barbarann Nest, MD  traMADol  (ULTRAM ) 50 MG tablet Take 1 tablet (50 mg total) by mouth 3 (three) times daily as needed for moderate pain (pain score 4-6) or severe pain (pain score 7-10). 11/21/23   Barbarann Nest, MD  traZODone  (DESYREL ) 100 MG tablet Take 1 tablet (100 mg total) by mouth at bedtime. 11/21/23   Barbarann Nest, MD  UNABLE TO FIND Take 30 mLs by mouth in the morning and at bedtime. Med Name: protein liquid    [provider]    Allergies: Nsaids    Review of Systems  Updated Vital Signs BP 122/64   Pulse 64  Temp 98 F (36.7 C) (Oral)   Resp 17   Ht 6' 2 (1.88 m)   Wt 85 kg   SpO2 100%   BMI 24.06 kg/m   Physical Exam Vitals and nursing note reviewed.  Constitutional:      General: He is not in acute distress.    Appearance: He is well-developed. He is ill-appearing. He is not diaphoretic.  HENT:     Head: Normocephalic and atraumatic.     Mouth/Throat:     Mouth: Mucous membranes are dry.  Eyes:     General: No scleral icterus.    Conjunctiva/sclera: Conjunctivae normal.  Cardiovascular:     Rate and Rhythm: Normal rate and regular rhythm.     Heart sounds: Normal heart sounds.  Pulmonary:     Effort: Pulmonary effort is normal. No respiratory distress.     Breath sounds: Normal breath sounds.  Abdominal:     Palpations: Abdomen is soft.     Tenderness: There is no abdominal tenderness.  Musculoskeletal:     Cervical back: Normal range of motion and neck supple.  Skin:    General: Skin is warm and dry.  Neurological:     Mental Status: He is alert and oriented to person, place, and time.  Psychiatric:        Behavior: Behavior normal.     (all labs ordered are listed, but only abnormal results are displayed) Labs Reviewed - No data to display  EKG: None  Radiology: No  results found.   Procedures   Medications Ordered in the ED - No data to display  Clinical Course as of 01/02/24 1813  Fri Jan 01, 2024  2154 Hemoglobin(!): 7.2 [AH]  2157 Creatinine(!): 3.11 [AH]  Sat Jan 02, 2024  0024 Consult to Dr. Sundil, who is agreeable to admitting the patient to her service. I appreciate her collaboration in the care of this patient.  [RS]    Clinical Course User Index [AH] Merdith Boyd, PA-C [RS] Sponseller, Pleasant SAUNDERS, PA-C                                 Medical Decision Making Amount and/or Complexity of Data Reviewed Labs: ordered. Decision-making details documented in ED Course.  Risk Decision regarding hospitalization.   This patient presents to the ED for concern of weakness, lethargy  this involves an extensive number of treatment options, and is a complaint that carries with it a high risk of complications and morbidity.  The differential diagnosis of weakness includes but is not limited to neurologic causes (GBS, myasthenia gravis, CVA, MS, ALS, transverse myelitis, spinal cord injury, CVA, botulism, ) and other causes: ACS, Arrhythmia, syncope, orthostatic hypotension, sepsis, hypoglycemia, electrolyte disturbance, hypothyroidism, respiratory failure, symptomatic anemia, dehydration, heat injury, polypharmacy, malignancy.   Co morbidities:   has a past medical history of Anemia, Arthritis, CAD in native artery, CHF (congestive heart failure) (HCC), CKD (chronic kidney disease) stage 3, GFR 30-59 ml/min (HCC), DDD (degenerative disc disease), cervical, Diabetes mellitus with complication (HCC), Diabetic neuropathy (HCC), Diabetic retinopathy (HCC), GERD (gastroesophageal reflux disease), Glaucoma, and Hypertension.   Social Determinants of Health:   SDOH Screenings   Food Insecurity: No Food Insecurity (10/30/2023)  Housing: Low Risk  (10/30/2023)  Transportation Needs: No Transportation Needs (10/30/2023)  Utilities: Not At Risk  (10/30/2023)  Alcohol Screen: Low Risk  (05/18/2023)  Financial Resource Strain: Low Risk  (05/18/2023)  Physical Activity:  Inactive (08/20/2017)  Social Connections: Socially Integrated (10/30/2023)  Stress: No Stress Concern Present (08/20/2017)  Tobacco Use: Medium Risk (01/01/2024)     Additional history:  {Additional history obtained from wife at bedside {External records from outside source obtained and reviewed including previous admission orders  Lab Tests:  I Ordered, and personally interpreted labs.  The pertinent results include:   WBC- elevated at 12.8 HGB 7.2<8.4 (on 11/19/2023) Glu 111 ( after OJ)   Cardiac Monitoring/ECG:  The patient was maintained on a cardiac monitor.  I personally viewed and interpreted the cardiac monitored which showed an underlying rhythm of: sinus rhythm rate of 67  Medicines ordered and prescription drug management:  I ordered medication including  Medications  sodium chloride  0.9 % bolus 1,000 mL (0 mLs Intravenous Stopped 01/01/24 2347)   for dehydration Reevaluation of the patient after these medicines showed that the patient improved I have reviewed the patients home medicines and have made adjustments as needed      ICD-10-CM   1. AKI (acute kidney injury) (HCC)  N17.9     2. Unable to feed self  R63.39       MDM:  Patient here with dehydration . Unable to feed self - needs admission for AKI- patient hand off at shift change to [facility]        Final diagnoses:  None    ED Discharge Orders     None          Arloa Chroman, PA-C 01/02/24 1831    Ruthe Cornet, DO 01/02/24 RONOLD

## 2024-01-01 NOTE — ED Triage Notes (Addendum)
 PT BIB [facility] from [facility] with a facility c/o PT being lethargic x 4 days.Lethargy has caused PT to have poor oral intake. Labs drawn at facility show PT 's HGB was 6.2. When EMS arrived PT had a CBG of 66, 1mg  of Glucagon given and PT last reading was 80. PT declined IV with EMS.PT states that he would not be here if he could just eat. When questioned why he doesn't eat, he states because the facility wont feed him because ethey fell he can feed himself and he can't.

## 2024-01-02 ENCOUNTER — Encounter (HOSPITAL_COMMUNITY): Payer: Self-pay | Admitting: Internal Medicine

## 2024-01-02 DIAGNOSIS — F411 Generalized anxiety disorder: Secondary | ICD-10-CM | POA: Insufficient documentation

## 2024-01-02 DIAGNOSIS — D649 Anemia, unspecified: Secondary | ICD-10-CM

## 2024-01-02 DIAGNOSIS — E11628 Type 2 diabetes mellitus with other skin complications: Secondary | ICD-10-CM | POA: Diagnosis present

## 2024-01-02 DIAGNOSIS — I5042 Chronic combined systolic (congestive) and diastolic (congestive) heart failure: Secondary | ICD-10-CM

## 2024-01-02 DIAGNOSIS — N189 Chronic kidney disease, unspecified: Secondary | ICD-10-CM | POA: Diagnosis not present

## 2024-01-02 DIAGNOSIS — L89153 Pressure ulcer of sacral region, stage 3: Secondary | ICD-10-CM | POA: Diagnosis present

## 2024-01-02 DIAGNOSIS — Z8679 Personal history of other diseases of the circulatory system: Secondary | ICD-10-CM

## 2024-01-02 DIAGNOSIS — E1142 Type 2 diabetes mellitus with diabetic polyneuropathy: Secondary | ICD-10-CM

## 2024-01-02 DIAGNOSIS — E1122 Type 2 diabetes mellitus with diabetic chronic kidney disease: Secondary | ICD-10-CM | POA: Diagnosis present

## 2024-01-02 DIAGNOSIS — Z794 Long term (current) use of insulin: Secondary | ICD-10-CM | POA: Diagnosis not present

## 2024-01-02 DIAGNOSIS — D631 Anemia in chronic kidney disease: Secondary | ICD-10-CM | POA: Diagnosis present

## 2024-01-02 DIAGNOSIS — E860 Dehydration: Secondary | ICD-10-CM | POA: Diagnosis present

## 2024-01-02 DIAGNOSIS — K3184 Gastroparesis: Secondary | ICD-10-CM | POA: Diagnosis present

## 2024-01-02 DIAGNOSIS — E119 Type 2 diabetes mellitus without complications: Secondary | ICD-10-CM

## 2024-01-02 DIAGNOSIS — E11621 Type 2 diabetes mellitus with foot ulcer: Secondary | ICD-10-CM | POA: Diagnosis present

## 2024-01-02 DIAGNOSIS — H60502 Unspecified acute noninfective otitis externa, left ear: Secondary | ICD-10-CM

## 2024-01-02 DIAGNOSIS — E162 Hypoglycemia, unspecified: Secondary | ICD-10-CM | POA: Insufficient documentation

## 2024-01-02 DIAGNOSIS — I10 Essential (primary) hypertension: Secondary | ICD-10-CM | POA: Diagnosis not present

## 2024-01-02 DIAGNOSIS — J029 Acute pharyngitis, unspecified: Secondary | ICD-10-CM | POA: Diagnosis present

## 2024-01-02 DIAGNOSIS — L97819 Non-pressure chronic ulcer of other part of right lower leg with unspecified severity: Secondary | ICD-10-CM | POA: Diagnosis present

## 2024-01-02 DIAGNOSIS — I130 Hypertensive heart and chronic kidney disease with heart failure and stage 1 through stage 4 chronic kidney disease, or unspecified chronic kidney disease: Secondary | ICD-10-CM | POA: Diagnosis present

## 2024-01-02 DIAGNOSIS — R627 Adult failure to thrive: Secondary | ICD-10-CM

## 2024-01-02 DIAGNOSIS — N179 Acute kidney failure, unspecified: Secondary | ICD-10-CM | POA: Diagnosis present

## 2024-01-02 DIAGNOSIS — G629 Polyneuropathy, unspecified: Secondary | ICD-10-CM

## 2024-01-02 DIAGNOSIS — L97829 Non-pressure chronic ulcer of other part of left lower leg with unspecified severity: Secondary | ICD-10-CM | POA: Diagnosis present

## 2024-01-02 DIAGNOSIS — H9202 Otalgia, left ear: Secondary | ICD-10-CM | POA: Diagnosis present

## 2024-01-02 DIAGNOSIS — I2510 Atherosclerotic heart disease of native coronary artery without angina pectoris: Secondary | ICD-10-CM | POA: Diagnosis present

## 2024-01-02 DIAGNOSIS — K5909 Other constipation: Secondary | ICD-10-CM | POA: Diagnosis present

## 2024-01-02 DIAGNOSIS — L97921 Non-pressure chronic ulcer of unspecified part of left lower leg limited to breakdown of skin: Secondary | ICD-10-CM

## 2024-01-02 DIAGNOSIS — K289 Gastrojejunal ulcer, unspecified as acute or chronic, without hemorrhage or perforation: Secondary | ICD-10-CM | POA: Insufficient documentation

## 2024-01-02 DIAGNOSIS — K219 Gastro-esophageal reflux disease without esophagitis: Secondary | ICD-10-CM | POA: Diagnosis present

## 2024-01-02 DIAGNOSIS — E11649 Type 2 diabetes mellitus with hypoglycemia without coma: Secondary | ICD-10-CM | POA: Diagnosis present

## 2024-01-02 DIAGNOSIS — N184 Chronic kidney disease, stage 4 (severe): Secondary | ICD-10-CM | POA: Diagnosis present

## 2024-01-02 DIAGNOSIS — E1143 Type 2 diabetes mellitus with diabetic autonomic (poly)neuropathy: Secondary | ICD-10-CM | POA: Diagnosis present

## 2024-01-02 LAB — COMPREHENSIVE METABOLIC PANEL WITH GFR
ALT: 33 U/L (ref 0–44)
AST: 39 U/L (ref 15–41)
Albumin: 2.6 g/dL — ABNORMAL LOW (ref 3.5–5.0)
Alkaline Phosphatase: 105 U/L (ref 38–126)
Anion gap: 8 (ref 5–15)
BUN: 50 mg/dL — ABNORMAL HIGH (ref 8–23)
CO2: 23 mmol/L (ref 22–32)
Calcium: 8.1 mg/dL — ABNORMAL LOW (ref 8.9–10.3)
Chloride: 107 mmol/L (ref 98–111)
Creatinine, Ser: 2.99 mg/dL — ABNORMAL HIGH (ref 0.61–1.24)
GFR, Estimated: 21 mL/min — ABNORMAL LOW (ref >60)
Glucose, Bld: 143 mg/dL — ABNORMAL HIGH (ref 70–99)
Potassium: 4.4 mmol/L (ref 3.5–5.1)
Sodium: 138 mmol/L (ref 135–145)
Total Bilirubin: 0.8 mg/dL (ref 0.0–1.2)
Total Protein: 7.8 g/dL (ref 6.5–8.1)

## 2024-01-02 LAB — URINALYSIS, ROUTINE W REFLEX MICROSCOPIC
Bilirubin Urine: NEGATIVE
Glucose, UA: NEGATIVE mg/dL
Hgb urine dipstick: NEGATIVE
Ketones, ur: NEGATIVE mg/dL
Leukocytes,Ua: NEGATIVE
Nitrite: NEGATIVE
Protein, ur: 30 mg/dL — AB
Specific Gravity, Urine: 1.014 (ref 1.005–1.030)
pH: 5 (ref 5.0–8.0)

## 2024-01-02 LAB — CBG MONITORING, ED
Glucose-Capillary: 148 mg/dL — ABNORMAL HIGH (ref 70–99)
Glucose-Capillary: 161 mg/dL — ABNORMAL HIGH (ref 70–99)

## 2024-01-02 LAB — CBC
HCT: 22 % — ABNORMAL LOW (ref 39.0–52.0)
Hemoglobin: 6.9 g/dL — CL (ref 13.0–17.0)
MCH: 35.9 pg — ABNORMAL HIGH (ref 26.0–34.0)
MCHC: 31.4 g/dL (ref 30.0–36.0)
MCV: 114.6 fL — ABNORMAL HIGH (ref 80.0–100.0)
Platelets: 229 K/uL (ref 150–400)
RBC: 1.92 MIL/uL — ABNORMAL LOW (ref 4.22–5.81)
RDW: 20.2 % — ABNORMAL HIGH (ref 11.5–15.5)
WBC: 8.2 K/uL (ref 4.0–10.5)
nRBC: 0 % (ref 0.0–0.2)

## 2024-01-02 LAB — GLUCOSE, CAPILLARY
Glucose-Capillary: 167 mg/dL — ABNORMAL HIGH (ref 70–99)
Glucose-Capillary: 156 mg/dL — ABNORMAL HIGH (ref 70–99)

## 2024-01-02 LAB — SODIUM, URINE, RANDOM: Sodium, Ur: 60 mmol/L

## 2024-01-02 LAB — PREPARE RBC (CROSSMATCH)

## 2024-01-02 LAB — CREATININE, URINE, RANDOM: Creatinine, Urine: 79 mg/dL

## 2024-01-02 MED ORDER — MENTHOL 3 MG MT LOZG
1.0000 | LOZENGE | OROMUCOSAL | Status: DC | PRN
  Filled 2024-01-02: qty 9

## 2024-01-02 MED ORDER — DOCUSATE SODIUM 100 MG PO CAPS
100.0000 mg | ORAL_CAPSULE | Freq: Two times a day (BID) | ORAL | Status: DC
  Administered 2024-01-02 – 2024-01-07 (×8): 100 mg via ORAL
  Filled 2024-01-02 (×11): qty 1

## 2024-01-02 MED ORDER — LATANOPROST 0.005 % OP SOLN
1.0000 [drp] | Freq: Every day | OPHTHALMIC | Status: DC
  Administered 2024-01-02 – 2024-01-06 (×6): 1 [drp] via OPHTHALMIC
  Filled 2024-01-02: qty 2.5

## 2024-01-02 MED ORDER — DICYCLOMINE HCL 10 MG PO CAPS
10.0000 mg | ORAL_CAPSULE | Freq: Three times a day (TID) | ORAL | Status: DC
  Administered 2024-01-02 – 2024-01-07 (×21): 10 mg via ORAL
  Filled 2024-01-02 (×21): qty 1

## 2024-01-02 MED ORDER — PHENOL 1.4 % MT LIQD
1.0000 | OROMUCOSAL | Status: DC | PRN
  Administered 2024-01-02: 1 via OROMUCOSAL
  Filled 2024-01-02: qty 177

## 2024-01-02 MED ORDER — SODIUM CHLORIDE 0.9% FLUSH
3.0000 mL | Freq: Two times a day (BID) | INTRAVENOUS | Status: DC
  Administered 2024-01-02 – 2024-01-07 (×10): 3 mL via INTRAVENOUS

## 2024-01-02 MED ORDER — POLYETHYLENE GLYCOL 3350 17 G PO PACK
17.0000 g | PACK | Freq: Every day | ORAL | Status: DC
  Administered 2024-01-02 – 2024-01-07 (×5): 17 g via ORAL
  Filled 2024-01-02 (×6): qty 1

## 2024-01-02 MED ORDER — TRAZODONE HCL 50 MG PO TABS
100.0000 mg | ORAL_TABLET | Freq: Every day | ORAL | Status: DC
  Administered 2024-01-02 – 2024-01-06 (×6): 100 mg via ORAL
  Filled 2024-01-02 (×7): qty 2

## 2024-01-02 MED ORDER — LACTATED RINGERS IV SOLN: INTRAVENOUS | Status: DC

## 2024-01-02 MED ORDER — PANTOPRAZOLE SODIUM 40 MG PO TBEC
40.0000 mg | DELAYED_RELEASE_TABLET | Freq: Every day | ORAL | Status: DC
  Administered 2024-01-02 – 2024-01-07 (×6): 40 mg via ORAL
  Filled 2024-01-02 (×6): qty 1

## 2024-01-02 MED ORDER — ALBUTEROL SULFATE (2.5 MG/3ML) 0.083% IN NEBU: 3.0000 mL | INHALATION_SOLUTION | Freq: Four times a day (QID) | RESPIRATORY_TRACT | Status: DC | PRN

## 2024-01-02 MED ORDER — ONDANSETRON HCL 4 MG PO TABS: 4.0000 mg | ORAL_TABLET | Freq: Four times a day (QID) | ORAL | Status: DC | PRN

## 2024-01-02 MED ORDER — CIPROFLOXACIN-FLUOCINOLONE PF 0.3-0.025 % OT SOLN
0.2500 mL | Freq: Two times a day (BID) | OTIC | Status: AC
  Administered 2024-01-02 – 2024-01-06 (×9): 0.25 mL via OTIC
  Filled 2024-01-02: qty 3.5

## 2024-01-02 MED ORDER — ATORVASTATIN CALCIUM 80 MG PO TABS
80.0000 mg | ORAL_TABLET | Freq: Every day | ORAL | Status: DC
  Administered 2024-01-02 – 2024-01-06 (×6): 80 mg via ORAL
  Filled 2024-01-02: qty 1
  Filled 2024-01-02: qty 2
  Filled 2024-01-02 (×4): qty 1

## 2024-01-02 MED ORDER — LACTATED RINGERS IV SOLN: INTRAVENOUS | Status: AC

## 2024-01-02 MED ORDER — ONDANSETRON HCL 4 MG/2ML IJ SOLN: 4.0000 mg | Freq: Four times a day (QID) | INTRAMUSCULAR | Status: DC | PRN

## 2024-01-02 MED ORDER — SERTRALINE HCL 25 MG PO TABS
25.0000 mg | ORAL_TABLET | Freq: Every day | ORAL | Status: DC
  Administered 2024-01-02 – 2024-01-07 (×6): 25 mg via ORAL
  Filled 2024-01-02 (×6): qty 1

## 2024-01-02 MED ORDER — GABAPENTIN 100 MG PO CAPS
200.0000 mg | ORAL_CAPSULE | Freq: Three times a day (TID) | ORAL | Status: DC
  Administered 2024-01-02 – 2024-01-07 (×17): 200 mg via ORAL
  Filled 2024-01-02 (×17): qty 2

## 2024-01-02 MED ORDER — ASPIRIN 81 MG PO TBEC
81.0000 mg | DELAYED_RELEASE_TABLET | Freq: Every day | ORAL | Status: DC
  Administered 2024-01-02 – 2024-01-07 (×6): 81 mg via ORAL
  Filled 2024-01-02 (×6): qty 1

## 2024-01-02 MED ORDER — LUBIPROSTONE 24 MCG PO CAPS
24.0000 ug | ORAL_CAPSULE | Freq: Every day | ORAL | Status: DC
  Administered 2024-01-03 – 2024-01-07 (×5): 24 ug via ORAL
  Filled 2024-01-02 (×8): qty 1

## 2024-01-02 MED ORDER — LORATADINE 10 MG PO TABS
10.0000 mg | ORAL_TABLET | Freq: Once | ORAL | Status: AC
  Administered 2024-01-02: 10 mg via ORAL
  Filled 2024-01-02: qty 1

## 2024-01-02 MED ORDER — ACETAMINOPHEN 650 MG RE SUPP: 650.0000 mg | Freq: Four times a day (QID) | RECTAL | Status: DC | PRN

## 2024-01-02 MED ORDER — SODIUM CHLORIDE 0.9% FLUSH: 3.0000 mL | INTRAVENOUS | Status: DC | PRN

## 2024-01-02 MED ORDER — SODIUM CHLORIDE 0.9% IV SOLUTION: Freq: Once | INTRAVENOUS | Status: AC

## 2024-01-02 MED ORDER — VITAMIN B-12 1000 MCG PO TABS
2000.0000 ug | ORAL_TABLET | Freq: Every day | ORAL | Status: DC
  Administered 2024-01-02 – 2024-01-07 (×6): 2000 ug via ORAL
  Filled 2024-01-02 (×6): qty 2

## 2024-01-02 MED ORDER — METOCLOPRAMIDE HCL 5 MG PO TABS
5.0000 mg | ORAL_TABLET | Freq: Three times a day (TID) | ORAL | Status: DC
  Administered 2024-01-02 – 2024-01-07 (×17): 5 mg via ORAL
  Filled 2024-01-02 (×17): qty 1

## 2024-01-02 MED ORDER — ACETAMINOPHEN 325 MG PO TABS
650.0000 mg | ORAL_TABLET | Freq: Four times a day (QID) | ORAL | Status: DC | PRN
  Administered 2024-01-02 – 2024-01-03 (×3): 650 mg via ORAL
  Filled 2024-01-02 (×4): qty 2

## 2024-01-02 MED ORDER — SODIUM CHLORIDE 0.9 % IV SOLN
250.0000 mL | INTRAVENOUS | Status: AC | PRN
Start: 2024-01-02 — End: 2024-01-03

## 2024-01-02 MED ORDER — ISOSORB DINITRATE-HYDRALAZINE 20-37.5 MG PO TABS
2.0000 | ORAL_TABLET | Freq: Three times a day (TID) | ORAL | Status: DC
  Administered 2024-01-02 – 2024-01-07 (×17): 2 via ORAL
  Filled 2024-01-02 (×17): qty 2

## 2024-01-02 MED ORDER — SODIUM CHLORIDE 0.9% FLUSH
3.0000 mL | Freq: Two times a day (BID) | INTRAVENOUS | Status: DC
  Administered 2024-01-02 – 2024-01-07 (×9): 3 mL via INTRAVENOUS

## 2024-01-02 MED ORDER — METHOCARBAMOL 500 MG PO TABS
500.0000 mg | ORAL_TABLET | Freq: Four times a day (QID) | ORAL | Status: DC | PRN
  Administered 2024-01-02 – 2024-01-04 (×3): 500 mg via ORAL
  Filled 2024-01-02 (×4): qty 1

## 2024-01-02 NOTE — Plan of Care (Signed)

## 2024-01-02 NOTE — ED Provider Notes (Signed)
  Physical Exam  BP (!) 144/126   Pulse 80   Temp 97.7 F (36.5 C) (Oral)   Resp 18   Ht 6' 2 (1.88 m)   Wt 85 kg   SpO2 100%   BMI 24.06 kg/m   Physical Exam  Procedures  Procedures  ED Course / MDM   Clinical Course as of 01/02/24 0026  Fri Jan 01, 2024  2154 Hemoglobin(!): 7.2 [AH]  2157 Creatinine(!): 3.11 [AH]  Sat Jan 02, 2024  0024 Consult to Dr. Sundil, who is agreeable to admitting the patient to her service. I appreciate her collaboration in the care of this patient.  [RS]    Clinical Course User Index [AH] Harris, Abigail, PA-C [RS] Latara Micheli, Pleasant SAUNDERS, PA-C   Medical Decision Making Amount and/or Complexity of Data Reviewed Labs: ordered. Decision-making details documented in ED Course.  Risk Decision regarding hospitalization.   Care of this patient assumed from preceding ED provider A. Harris, PA-C at time of shift change. Please see her associated note for further insight into the patient's ED course. In brief, patient requiring admission for AKI and failure to thrive; no PO x 5 days, per patient. Hx of severe diabetic retinopathy and neuropathy.   Consult to hospitalist as above. Conal voiced understanding of her medical evaluation and treatment plan. Each of their questions answered to their expressed satisfaction. He is amenable to plan for admission.   This chart was dictated using voice recognition software, Dragon. Despite the best efforts of this provider to proofread and correct errors, errors may still occur which can change documentation meaning.      Prudy Candy R, PA-C 01/02/24 0032    Palumbo, April, MD 01/02/24 [phone]

## 2024-01-02 NOTE — Care Plan (Signed)
 This 78 yrs old male with medical history significant for combined CHF, reduced EF 30 to 35%, chronic microcytic anemia, Essential hypertension, CKD stage IV, CAD s/p cardiac stents, insulin -dependent DM type II, and chronic diabetic foot ulcers bilateral lower extremities presented in the ED for the evaluation for decreased oral intake, low hemoglobin 7.2 and hypoglycemia with blood glucose 66. EMS gave glucagon with improvement of blood glucose to 80.  Patient states that skilled nursing facility/nursing home is not feeding him well and he is unable to feed himself.  Patient is stating that nursing home has not feed him or give any meal for last 5 days.  Patient and family is requesting different SNF facility for placement.  Patient found to have AKI on CKD stage IV.  He is continued on IV fluid resuscitation.  Patient was seen and examined at bedside.  He appears at his baseline denies any concerns.

## 2024-01-02 NOTE — Evaluation (Signed)
 Physical Therapy Evaluation Patient Details Name: David [name] MRN: [id number] DOB: Mar 14, 1946 Today's Date: 01/02/2024  History of Present Illness  78 y.o. male presenting 8/15 from [place] where he was reported to be lethargic with poor PO intake and labs showing HGB of 6.2. Found to have AKI, FTT, hypoglycemia. PMH significant of CAD, combined CHF, HTN, L fifth metatarsal amputation, chronic BLE ulcers, DMII, diabetic neuropathy, diabetic retinopathy, glaucoma, and CKD   Clinical Impression  Pt admitted with above. Per chart pt was at [facility] P[facility]. Pt reports being able to sit on EOB and lateral scoot to/from w/c PTA. At this time patient requiring maxA to roll to the L to remove soiled linens and brief. Pt unable to maintain long sit position in hospital bed with use of bilat bed rails > 10 sec due to onset of fatigue. Pt with R lateral lean. Pt remains appropriate to return to SNF upon d/c to maximize functional recovery. Acute PT to cont to follow.        If plan is discharge home, recommend the following: Two people to help with walking and/or transfers;Two people to help with bathing/dressing/bathroom   Can travel by private vehicle   No    Equipment Recommendations None recommended by PT  Recommendations for Other Services       Functional Status Assessment Patient has had a recent decline in their functional status and demonstrates the ability to make significant improvements in function in a reasonable and predictable amount of time.     Precautions / Restrictions Precautions Precautions: Fall Recall of Precautions/Restrictions: Intact Restrictions Weight Bearing Restrictions Per Provider Order: No      Mobility  Bed Mobility Overal bed mobility: Needs Assistance Bed Mobility: Rolling Rolling: Max assist, +2 for physical assistance, Used rails         General bed mobility comments: pt requires assist to cross LEs per his request, maxA to roll trunk,  pt did initiate reaching with R UE across body to L rail however however unable to pull    Transfers                   General transfer comment: HOB elevated, had pt pull self forwards on bed rails to allow PT to remove cardigan    Ambulation/Gait               General Gait Details: non-ambulatory  Stairs            Wheelchair Mobility     Tilt Bed    Modified Rankin (Stroke Patients Only)       Balance Overall balance assessment: Needs assistance (unable to transfer to EOB to assess, pt unable to hold self up in long sit position)                                           Pertinent Vitals/Pain Pain Assessment Pain Assessment: Faces Faces Pain Scale: Hurts even more Pain Location: generalized with movement Pain Descriptors / Indicators: Discomfort Pain Intervention(s): Monitored during session    Home Living Family/patient expects to be discharged to:: Skilled nursing facility                   Additional Comments: Pt is currently at [facility] however family requesting different placement    Prior Function Prior Level of Function : Needs assist  Mobility Comments: reports he was working with therapy and able to sit EOB and lateral scoot to/from bed/w/c ADLs Comments: assist from rehab staff     Extremity/Trunk Assessment   Upper Extremity Assessment Upper Extremity Assessment: Generalized weakness (pt with bilat neuropathy in hands, wears gloves, limited dexterity/grip)    Lower Extremity Assessment Lower Extremity Assessment: Generalized weakness (pt stiff with limited active ROM)       Communication   Communication Communication: No apparent difficulties    Cognition Arousal: Alert Behavior During Therapy: WFL for tasks assessed/performed                           PT - Cognition Comments: pt hyperfocused on his missing white shirt he lost in the ED earlier this morning. Pt  freq falling asleep Following commands: Intact       Cueing Cueing Techniques: Verbal cues, Tactile cues     General Comments General comments (skin integrity, edema, etc.): pt with discolored LEs, on 4Lo2 via [place]    Exercises     Assessment/Plan    PT Assessment Patient needs continued PT services  PT Problem List Decreased strength;Decreased range of motion;Decreased activity tolerance;Decreased balance;Decreased mobility;Decreased coordination       PT Treatment Interventions DME instruction;Gait training;Stair training;Functional mobility training;Therapeutic activities;Therapeutic exercise;Balance training;Neuromuscular re-education    PT Goals (Current goals can be found in the Care Plan section)  Acute Rehab PT Goals Patient Stated Goal: get stronger PT Goal Formulation: With patient/family Time For Goal Achievement: 01/16/24 Potential to Achieve Goals: Good    Frequency Min 2X/week     Co-evaluation               AM-PAC PT 6 Clicks Mobility  Outcome Measure Help needed turning from your back to your side while in a flat bed without using bedrails?: Total Help needed moving from lying on your back to sitting on the side of a flat bed without using bedrails?: Total Help needed moving to and from a bed to a chair (including a wheelchair)?: Total Help needed standing up from a chair using your arms (e.g., wheelchair or bedside chair)?: Total Help needed to walk in hospital room?: Total Help needed climbing 3-5 steps with a railing? : Total 6 Click Score: 6    End of Session   Activity Tolerance: Patient limited by fatigue Patient left: in bed;with call bell/phone within reach;with family/visitor present;with nursing/sitter in room Nurse Communication: Mobility status PT Visit Diagnosis: Unsteadiness on feet (R26.81);Difficulty in walking, not elsewhere classified (R26.2)    Time: 8692-8665 PT Time Calculation (min) (ACUTE ONLY): 27 min   Charges:    PT Evaluation $PT Eval Moderate Complexity: 1 Mod PT Treatments $Therapeutic Activity: 8-22 mins PT General Charges $$ ACUTE PT VISIT: 1 Visit         David [name], PT, DPT Acute Rehabilitation Services Secure chat preferred Office #: [phone]   David [name] 01/02/2024, 3:03 PM

## 2024-01-02 NOTE — H&P (Signed)
 History and Physical    David [name] FMW:[id number] DOB: December 06, 1945 DOA: 01/01/2024  PCP: Pcp, No   Patient coming from: ALF   Chief Complaint:  Chief Complaint  Patient presents with   Fatigue   ED TRIAGE note: PT BIB [facility] from [facility] with a facility c/o PT being lethargic x 4 days.Lethargy has caused PT to have poor oral intake. Labs drawn at facility show PT 's HGB was 6.2. When EMS arrived PT had a CBG of 66, 1mg  of Glucagon given and PT last reading was 80. PT declined IV with EMS.PT states that he would not be here if he could just eat. When questioned why he doesn't eat, he states because the facility wont feed him because ethey fell he can feed himself and he can't.      HPI:  David [name] is a 78 y.o. male with medical history significant of combined CHF reduced EF 30 to 35%, chronic microcytic anemia, essential hypertension, CKD stage IV, CAD s/p cardiac stent, insulin -dependent DM type II, and chronic diabetic foot ulcer bilateral lower extremities presented emergency department for evaluation for oral intake, low hemoglobin 6.2 and hypoglycemia blood glucose 66. EMS gave glucagon with improvement of blood glucose to 80.  Patient stated that skilled nursing facility/nursing home he is not feeding him well and he is unable to feed himself.  Patient is stating that nursing home has not feed him or give any meal for last 5 days.  Patient denies any headache, dizziness, abdominal pain, nausea, vomiting, diarrhea, change of appetite and weight.  However at the bedside during my evaluation patient has multiple complaints include denies seeing home he is not changing his bed, they are not helping him to feed, and not taking care of him very well and requesting to change to nursing home.  Patient also complaining about sore throat and left-sided ear pain for 1 week and stating that it has been treated by the nursing home though.  Patient stating that he has peripheral  neuropathy of both hands and cannot feed himself.  Also has chronic bilateral lower extremities diabetic foot ulcer. Patient does not have any other complaint at this time.  During last hospitalization 11/2023 patient developed anemia hemoglobin dropped to 6 required 1 unit of blood transfusion underwent EGD showed nonbleeding GI ulcer and biopsy showed chronic active gastritis with ulcer and purulent exudates, iron  deposits suggestive of iron  pill gastritis Colonoscopy was offered but he declined.   ED Course:  At presentation to ED patient is hemodynamically stable.  Currently requiring oxygen 2 L O2 sat 100%.  Blood glucose 111.  CMP showing elevated creatinine 3.11 and low GFR 30.  Low calcium  8.3.  Low albumin  2.4. CBC showing hemoglobin 7.2 (baseline hemoglobin around 7-8), elevated WBC count 12 and normal platelet count.  In the ED patient received 1 L of LR bolus.  Hospitalist has been consulted for further evaluation management of AKI, hypoglycemia and failure to thrive.    Significant labs in the ED: Lab Orders         Comprehensive metabolic panel         CBC with Differential         Urinalysis, Routine w reflex microscopic -Urine, Clean Catch         CBC with Differential/Platelet         Sodium, urine, random         Creatinine, urine, random         Comprehensive  metabolic panel         CBC         CBG monitoring, ED         CBG monitoring, ED       Review of Systems:  Review of Systems  Constitutional:  Negative for chills, fever, malaise/fatigue and weight loss.  Respiratory:  Negative for cough, sputum production and shortness of breath.   Cardiovascular:  Negative for chest pain and palpitations.  Gastrointestinal:  Negative for abdominal pain, heartburn, nausea and vomiting.  Genitourinary:  Negative for dysuria, frequency, hematuria and urgency.  Musculoskeletal:  Negative for myalgias.       Bilateral hand pain  Neurological:  Negative for dizziness and  headaches.  Psychiatric/Behavioral:  The patient is not nervous/anxious.     Past Medical History:  Diagnosis Date   Anemia    Arthritis    CAD in native artery    s/p stent in 11/18   CHF (congestive heart failure) (HCC)    normal echo in 11/18   CKD (chronic kidney disease) stage 3, GFR 30-59 ml/min (HCC)    DDD (degenerative disc disease), cervical    Diabetes mellitus with complication (HCC)    Type II   Diabetic neuropathy (HCC)    Diabetic retinopathy (HCC)    GERD (gastroesophageal reflux disease)    Glaucoma    Hypertension     Past Surgical History:  Procedure Laterality Date   AMPUTATION Right 07/09/2016   Procedure: Right Great Toe Amputation at Metatarsophalangeal Joint;  Surgeon: Jerona Harden GAILS, MD;  Location: [facility] OR;  Service: Orthopedics;  Laterality: Right;   AMPUTATION Right 05/28/2018   Procedure: RIGHT SECOND TOE AMPUTATION;  Surgeon: Harden Jerona GAILS, MD;  Location: [facility] OR;  Service: Orthopedics;  Laterality: Right;   AMPUTATION Left 08/03/2020   Procedure: LEFT 5TH RAY AMPUTATION;  Surgeon: Harden Jerona GAILS, MD;  Location: [facility] OR;  Service: Orthopedics;  Laterality: Left;   BACK SURGERY     4   CARDIAC CATHETERIZATION     CORONARY STENT INTERVENTION N/A 04/06/2017   Procedure: CORONARY STENT INTERVENTION;  Surgeon: Elmira Newman PARAS, MD;  Location: [facility] IN[facility]AS[facility] LAB;  Service: Cardiovascular;  Laterality: N/A;   CORONARY STENT INTERVENTION N/A 04/07/2017   Procedure: CORONARY STENT INTERVENTION;  Surgeon: Elmira Newman PARAS, MD;  Location: [facility] INVAS[facility] CV LAB;  Service: Cardiovascular;  Laterality: N/A;   CYST EXCISION     on Back   ENDOTRACHEAL INTUBATION EMERGENT  08/07/2018       ESOPHAGOGASTRODUODENOSCOPY Left 11/07/2023   Procedure: EGD (ESOPHAGOGASTRODUODENOSCOPY);  Surgeon: Rosalie Kitchens, MD;  Location: [facility];  Service: Gastroenterology;  Laterality: Left;   JOINT REPLACEMENT     LAPAROSCOPIC ABDOMINAL EXPLORATION N/A 11/26/2017   Procedure:  LAPAROSCOPIC ABDOMINAL EXPLORATION, DRAINAGE OF APPENDICEAL ABCESS. PLACEMENT OF DRAIN;  Surgeon: Ethyl Lenis, MD;  Location: [facility] OR;  Service: General;  Laterality: N/A;   RIGHT/LEFT HEART CATH AND CORONARY ANGIOGRAPHY N/A 04/06/2017   Procedure: RIGHT/LEFT HEART CATH AND CORONARY ANGIOGRAPHY;  Surgeon: Elmira Newman PARAS, MD;  Location: [facility];  Service: Cardiovascular;  Laterality: N/A;   TOE SURGERY  05/2018   Left    TOTAL HIP ARTHROPLASTY     Right      reports that he quit smoking about 29 years ago. His smoking use included cigarettes. He started smoking about 34 years ago. He has a 1.3 pack-year smoking history. He has been exposed to tobacco smoke. He has never used  smokeless tobacco. He reports that he does not currently use alcohol. He reports that he does not use drugs.  Allergies  Allergen Reactions   Nsaids Other (See Comments)    CKD stage 3     Family History  Problem Relation Age of Onset   Diabetes Other    Hyperlipidemia Other    Hypertension Other    Stroke Other    Alzheimer's disease Other    Thyroid  disease Mother    Diabetes Mellitus II Father    Alzheimer's disease Father     Prior to Admission medications   Medication Sig Start Date End Date Taking? Authorizing Provider  ascorbic acid (VITAMIN C) 500 MG tablet Take 500 mg by mouth every evening.   Yes [provider]  aspirin  EC 81 MG tablet Take 1 tablet (81 mg total) by mouth daily. 11/21/23  Yes Barbarann Nest, MD  atorvastatin  (LIPITOR ) 80 MG tablet Take 1 tablet (80 mg total) by mouth at bedtime. 11/21/23  Yes Barbarann Nest, MD  lubiprostone  (AMITIZA ) 24 MCG capsule Take 1 capsule (24 mcg total) by mouth daily. 11/21/23  Yes Barbarann Nest, MD  acetaminophen  (TYLENOL ) 500 MG tablet Take 2 tablets (1,000 mg total) by mouth in the morning and at bedtime. 11/21/23   Barbarann Nest, MD  albuterol  (VENTOLIN  HFA) 108 [phone] Base) MCG/ACT inhaler Inhale 2 puffs into the lungs every 6  (six) hours as needed for wheezing or shortness of breath. 11/21/23   Barbarann Nest, MD  albuterol  (VENTOLIN  HFA) 108 (90 Base) MCG/ACT inhaler Inhale 2 puffs into the lungs every 6 (six) hours as needed for wheezing or shortness of breath.. 11/21/23   Barbarann Nest, MD  amLODipine  (NORVASC ) 10 MG tablet Take 10 mg by mouth daily. 05/29/23   [provider]  bisacodyl  (DULCOLAX) 10 MG suppository Place 1 suppository (10 mg total) rectally daily as needed for moderate constipation. 11/21/23   Barbarann Nest, MD  cholecalciferol  (VITAMIN D3) 25 MCG (1000 UNIT) tablet Take 5 tablets (5,000 Units total) by mouth daily. 11/21/23   Barbarann Nest, MD  cyanocobalamin  (VITAMIN B12) 1000 MCG tablet Take 2 tablets (2,000 mcg total) by mouth daily. 11/21/23   Barbarann Nest, MD  dextromethorphan -guaiFENesin  (MUCINEX  DM) 30-600 MG 12hr tablet Take 1 tablet by mouth 2 (two) times daily. 11/21/23   Barbarann Nest, MD  dicyclomine  (BENTYL ) 10 MG capsule Take 1 capsule (10 mg total) by mouth in the morning, at noon, in the evening, and at bedtime. 11/21/23   Barbarann Nest, MD  docusate sodium  (COLACE) 100 MG capsule Take 1 capsule (100 mg total) by mouth 2 (two) times daily. 11/21/23   Barbarann Nest, MD  ferrous sulfate  325 (65 FE) MG tablet Take 1 tablet (325 mg total) by mouth daily with breakfast. 11/21/23   Barbarann Nest, MD  gabapentin  (NEURONTIN ) 100 MG capsule Take 2 capsules (200 mg total) by mouth 3 (three) times daily. 11/21/23   Barbarann Nest, MD  insulin  aspart (NOVOLOG ) 100 UNIT/ML injection Inject 0-6 Units into the skin 3 (three) times daily with meals. 11/21/23   Barbarann Nest, MD  isosorbide -hydrALAZINE  (BIDIL ) 20-37.5 MG tablet Take 2 tablets by mouth three times daily. 11/25/23   Patwardhan, Newman PARAS, MD  LANTUS  SOLOSTAR 100 UNIT/ML Solostar Pen Inject 12 Units into the skin daily. 11/21/23   Barbarann Nest, MD  latanoprost  (XALATAN ) 0.005 % ophthalmic solution Place 1 drop into both eyes at  bedtime. 11/21/23   Barbarann Nest, MD  leptospermum keary qua (MEDIHONEY) GAILA  paste Apply 1 Application topically daily. Apply to buttocks and left heel as directed 11/21/23   Barbarann Nest, MD  melatonin 3 MG TABS tablet Take 1 tablet (3 mg total) by mouth at bedtime. 11/21/23   Barbarann Nest, MD  methocarbamol  (ROBAXIN ) 500 MG tablet Take 1 tablet (500 mg total) by mouth every 6 (six) hours as needed for muscle spasms. 11/21/23   Barbarann Nest, MD  metoCLOPramide  (REGLAN ) 5 MG tablet Take 1 tablet (5 mg total) by mouth 3 (three) times daily. 11/21/23 11/20/24  Barbarann Nest, MD  metoprolol  succinate (TOPROL -XL) 100 MG 24 hr tablet Take 100 mg by mouth daily. 05/29/23   [provider]  Multiple Vitamin (MULTIVITAMIN WITH MINERALS) TABS tablet Take 1 tablet by mouth daily. 11/21/23   Barbarann Nest, MD  Multiple Vitamins-Minerals (CERTAVITE SENIOR) TABS Take 1 tablet by mouth daily. 11/21/23   Barbarann Nest, MD  nitroGLYCERIN  (NITROSTAT ) 0.4 MG SL tablet Place 1 tablet (0.4 mg total) under the tongue every 5 (five) minutes as needed for chest pain. 11/21/23   Barbarann Nest, MD  ondansetron  (ZOFRAN ) 4 MG tablet Take 1 tablet (4 mg total) by mouth every 6 (six) hours as needed for nausea or vomiting. 11/21/23   Barbarann Nest, MD  OXYGEN Inhale 3 L/min into the lungs continuous.    [provider]  pantoprazole  (PROTONIX ) 40 MG tablet Take 1 tablet (40 mg total) by mouth daily. 11/21/23   Barbarann Nest, MD  polyethylene glycol powder (GLYCOLAX /MIRALAX ) 17 GM/SCOOP powder Take 17 g by mouth daily. PRN order: 17 g by mouth daily as needed for constipation 11/21/23   Barbarann Nest, MD  pyridoxine  (B-6) 100 MG tablet Take 1 tablet (100 mg total) by mouth daily. 11/21/23   Barbarann Nest, MD  sertraline  (ZOLOFT ) 25 MG tablet Take 1 tablet (25 mg total) by mouth daily. 11/21/23   Barbarann Nest, MD  sodium chloride  (OCEAN) 0.65 % SOLN nasal spray Place 1 spray into both nostrils as needed for  congestion. 11/21/23   Barbarann Nest, MD  torsemide  (DEMADEX ) 20 MG tablet Take 1 tablet (20 mg total) by mouth daily. 11/21/23   Barbarann Nest, MD  traMADol  (ULTRAM ) 50 MG tablet Take 1 tablet (50 mg total) by mouth 3 (three) times daily as needed for moderate pain (pain score 4-6) or severe pain (pain score 7-10). 11/21/23   Barbarann Nest, MD  traZODone  (DESYREL ) 100 MG tablet Take 1 tablet (100 mg total) by mouth at bedtime. 11/21/23   Barbarann Nest, MD  UNABLE TO FIND Take 30 mLs by mouth in the morning and at bedtime. Med Name: protein liquid    [provider]     Physical Exam: Vitals:   01/02/24 0000 01/02/24 0015 01/02/24 0241 01/02/24 0330  BP: (!) 150/82 (!) 170/98  (!) 157/91  Pulse:    84  Resp: 13 (!) 21  16  Temp:   97.9 F (36.6 C) 98.3 F (36.8 C)  TempSrc:   Oral Oral  SpO2:    100%  Weight:      Height:        Physical Exam Constitutional:      Appearance: Normal appearance. He is not ill-appearing.  HENT:     Mouth/Throat:     Mouth: Mucous membranes are moist.  Eyes:     Pupils: Pupils are equal, round, and reactive to light.  Cardiovascular:     Rate and Rhythm: Normal rate and regular rhythm.     Pulses: Normal  pulses.     Heart sounds: Normal heart sounds.  Pulmonary:     Effort: Pulmonary effort is normal.     Breath sounds: Normal breath sounds.  Abdominal:     Palpations: Abdomen is soft.  Musculoskeletal:        General: Deformity present. No signs of injury.     Cervical back: Neck supple.     Right lower leg: No edema.     Left lower leg: No edema.     Comments: Bilateral lower extremities bandaged and Unna boot in place.  Skin:    General: Skin is dry.     Capillary Refill: Capillary refill takes less than 2 seconds.  Neurological:     Mental Status: He is alert and oriented to person, place, and time.     Sensory: No sensory deficit.  Psychiatric:        Mood and Affect: Mood normal.      Labs on Admission: I have  personally reviewed following labs and imaging studies  CBC: Recent Labs  Lab 01/01/24 2015  WBC 12.8*  NEUTROABS 10.6*  HGB 7.2*  HCT 23.6*  MCV 115.7*  PLT 232   Basic Metabolic Panel: Recent Labs  Lab 01/01/24 1947 01/02/24 0110  NA 139 138  K 4.8 4.4  CL 108 107  CO2 22 23  GLUCOSE 110* 143*  BUN 51* 50*  CREATININE 3.11* 2.99*  CALCIUM  8.3* 8.1*   GFR: Estimated Creatinine Clearance: 24.4 mL/min (A) (by C-G formula based on SCr of 2.99 mg/dL (H)). Liver Function Tests: Recent Labs  Lab 01/01/24 1947 01/02/24 0110  AST 30 39  ALT 30 33  ALKPHOS 97 105  BILITOT 0.5 0.8  PROT 7.5 7.8  ALBUMIN  2.4* 2.6*   No results for input(s): LIPASE, AMYLASE in the last 168 hours. No results for input(s): AMMONIA in the last 168 hours. Coagulation Profile: No results for input(s): INR, PROTIME in the last 168 hours. Cardiac Enzymes: No results for input(s): CKTOTAL, CKMB, CKMBINDEX, TROPONINI, TROPONINIHS in the last 168 hours. BNP (last 3 results) Recent Labs    05/04/23 2047 05/11/23 2310 10/29/23 1746  BNP 455.1* 202.7* 650.9*   HbA1C: No results for input(s): HGBA1C in the last 72 hours. CBG: Recent Labs  Lab 01/01/24 2009 01/02/24 0113  GLUCAP 111* 148*   Lipid Profile: No results for input(s): CHOL, HDL, LDLCALC, TRIG, CHOLHDL, LDLDIRECT in the last 72 hours. Thyroid  Function Tests: No results for input(s): TSH, T4TOTAL, FREET4, T3FREE, THYROIDAB in the last 72 hours. Anemia Panel: No results for input(s): VITAMINB12, FOLATE, FERRITIN, TIBC, IRON , RETICCTPCT in the last 72 hours. Urine analysis:    Component Value Date/Time   COLORURINE YELLOW 01/02/2024 0110   APPEARANCEUR CLEAR 01/02/2024 0110   LABSPEC 1.014 01/02/2024 0110   PHURINE 5.0 01/02/2024 0110   GLUCOSEU NEGATIVE 01/02/2024 0110   HGBUR NEGATIVE 01/02/2024 0110   BILIRUBINUR NEGATIVE 01/02/2024 0110   KETONESUR NEGATIVE  01/02/2024 0110   PROTEINUR 30 (A) 01/02/2024 0110   UROBILINOGEN 1.0 02/13/2015 1934   NITRITE NEGATIVE 01/02/2024 0110   LEUKOCYTESUR NEGATIVE 01/02/2024 0110    Radiological Exams on Admission: I have personally reviewed images No results found.   EKG: My personal interpretation of EKG shows: Normal sinus rhythm heart rate 67    Assessment/Plan: Principal Problem:   Acute kidney injury superimposed on chronic kidney disease (HCC) Active Problems:   Failure to thrive in adult   Essential hypertension   Insulin  dependent type 2  diabetes mellitus (HCC)   Peripheral neuropathy   Diabetic polyneuropathy associated with type 2 diabetes mellitus (HCC)   Nonhealing ulcer of left lower extremity limited to breakdown of skin (HCC)   Chronic anemia   Chronic combined systolic and diastolic CHF (congestive heart failure) (HCC)   Hypoglycemia   History of CAD (coronary artery disease)   History of gastrointestinal ulcer   Generalized anxiety disorder    Assessment and Plan: Acute kidney injury superimposed CKD stage IV -Patient presented emergency department complaining of unable to feed himself at nursing home is not giving him food almost for last 5 days.  EMS found patient was hypoglycemic blood glucose 66 which improved to 100 after giving glucagon.  Patient is hemodynamically stable. Currently requiring oxygen 2 L O2 sat 100%.  Blood glucose 111.  CMP showing elevated creatinine 3.11 and low GFR 30.  Low calcium  8.3.  Low albumin  2.4. cBC showing hemoglobin 7.2 (baseline hemoglobin around 7-8), elevated WBC count 12 and normal platelet count. In the ED patient received 1 L of LR bolus. -Checking UA, urine creatinine and sodium. - Continue maintenance fluid LR 75 cc/h. - Continue monitor improvement of renal function, avoid nephrotoxic agent and renally adjust medications.   Failure to thrive in adult Reported he is unable to feed himself due to generalized weakness.  Concern for  failure to thrive in adult.  Consulting inpatient PT and OT for evaluation and possible long-term assisted living facility placement.  Hypoglycemia-resolved Insulin -dependent DM type II - Initial blood glucose was 66 which improved to 100 after giving glucagon per EMS report.  Most recent A1c 6 in June 2025. - Hold Lantus  in the setting of hypoglycemia.  Continue check POC blood glucose.  If glucose continue to trend Siy in that case we will restart sliding scale insulin  as needed.  Anemia of chronic disease History of GI ulcer on most recent EGD 12/06/2023 -Stable H&H 7.2 and 23.  Elevated MCV 115.  Baseline hemoglobin around 7-8.  History of chronic anemia in the setting of CKD and history of GI ulcer. - Given hemoglobin at baseline continue to monitor at this time there is no need for blood transfusion.   Peripheral neuropathy -Continue gabapentin   Generalized anxiety disorder -Continue Zoloft   Chronic constipation Diabetic gastroparesis - Continue Reglan  5 mg 3 times daily, maintenance L milligram 3 times daily.  Combined systolic and diastolic heart reduced EF less than 30 to 35% -Blood pressure within good range.  Continue BiDil  3 times daily.  History of CAD -Continue aspirin  and Lipitor .  Bilateral lower extremities chronic diabetic ulcer - Consulting wound care for dressing change and further management.   Sore throat and left-sided ear pain - Physical exam showed normal left-sided ear [place] and normal tympanic membrane.  Patient continues medium pain with manipulation of the left ear. - Continue ciprofloxacin  eardrop twice daily for 5 days.   DVT prophylaxis:  SCDs Code Status:  Full Code Diet: Heart healthy and carb modified diet Family Communication:   Family was present at bedside, at the time of interview. Opportunity was given to ask question and all questions were answered satisfactorily.  Disposition Plan: Pending PT and OT evaluation.  Continue your  improvement of renal function. Consults: Physical therapy and optional therapy Admission status:   Observation, Telemetry bed  Severity of Illness: The appropriate patient status for this patient is OBSERVATION. Observation status is judged to be reasonable and necessary in order to provide the required intensity of service to  ensure the patient's safety. The patient's presenting symptoms, physical exam findings, and initial radiographic and laboratory data in the context of their medical condition is felt to place them at decreased risk for further clinical deterioration. Furthermore, it is anticipated that the patient will be medically stable for discharge from the hospital within 2 midnights of admission.     Chiniqua Kilcrease, MD [facility]  How to contact the [facility] Attending or Consulting provider 7A - 7P or covering provider during after hours 7P -7A, for this patient.  Check the care team in [facility] and look for a) attending/consulting [facility] provider listed and b) the [facility] team listed Log into [email or web address] and use [place] universal password to access. If you do not have the password, please contact the hospital operator. Locate the [facility] provider you are looking for under [facility] and page to a number that you can be directly reached. If you still have difficulty reaching the provider, please page the [facility] (Director on Call) for the Hospitalists listed on amion for assistance.  01/02/2024, 3:49 AM

## 2024-01-02 NOTE — Consult Note (Addendum)
 WOC Nurse Consult Note: patient is known to WOC team from multiple previous admissions with chronic pressure injuries to B heels/diabetic ulcers to feet and a Stage 3 Pressure injury sacrum  Reason for Consult: foot wounds  Wound type: 1.  L great toe full thickness diabetic ulcer pink dry  2.  L lateral foot full thickness likely r/t PAD and neuropathy dark eschar  3. L heel unstageable Pressure Injury 80% eschar 20% pink dry; R heel unstageable 80% black eschar 20% pink  4.  Sacral deep tissue pressure injury purple maroon discoloration; B buttocks with healing Stage 3 Pressure Injury pink and moist  5.  R plantar foot full thickness wound dark eschar/callusing neuropathic  Pressure Injury POA: Yes, heels and sacrum/buttocks  Measurement: see nursing flowsheet  Wound bed: as above  Drainage (amount, consistency, odor) see nursing flowsheet  Periwound:dry flaky skin, heavy callusing to plantar foot Dressing procedure/placement/frequency:  Cleanse sacral wound with NS, apply Xeroform gauze Soila [phone]) to wound bed daily and secure with silicone foam.  Apply silicone foam to healing wounds B buttocks, lift daily to assess.  Paint L great toe, L lateral foot and R plantar foot wounds with Betadine 2 times daily and secure with silicone foam or dry gauze and Kerlix roll guaze.  Paint heel wounds with Betadine then cover with Xeroform gauze ([employer]son #[id number]) daily.  Cover with silicone foam or Kerlix roll guaze and place B feet in Prevalon boots to offload pressure.   Patient would benefit from low air loss mattress for pressure redistribution and moisture management.    POC discussed with bedside nurse. WOC team will not follow. DEEP TISSUE PRESSURE INJURIES ARE HIGH RISK TO DETERIORATE.  Please reconsult if further needs arise.   Thank you,    Powell Bar MSN, RN-BC, [employer]

## 2024-01-03 DIAGNOSIS — N189 Chronic kidney disease, unspecified: Secondary | ICD-10-CM

## 2024-01-03 DIAGNOSIS — N179 Acute kidney failure, unspecified: Secondary | ICD-10-CM

## 2024-01-03 LAB — GLUCOSE, CAPILLARY
Glucose-Capillary: 108 mg/dL — ABNORMAL HIGH (ref 70–99)
Glucose-Capillary: 114 mg/dL — ABNORMAL HIGH (ref 70–99)
Glucose-Capillary: 126 mg/dL — ABNORMAL HIGH (ref 70–99)
Glucose-Capillary: 139 mg/dL — ABNORMAL HIGH (ref 70–99)
Glucose-Capillary: 146 mg/dL — ABNORMAL HIGH (ref 70–99)
Glucose-Capillary: 154 mg/dL — ABNORMAL HIGH (ref 70–99)

## 2024-01-03 MED ORDER — TRAMADOL HCL 50 MG PO TABS
50.0000 mg | ORAL_TABLET | Freq: Two times a day (BID) | ORAL | Status: DC | PRN
  Administered 2024-01-03 – 2024-01-07 (×6): 50 mg via ORAL
  Filled 2024-01-03 (×6): qty 1

## 2024-01-03 NOTE — [place] FL2 (Signed)
   MEDICAID FL2 LEVEL OF CARE FORM     IDENTIFICATION  Patient Name: David [name] Birthdate: 06-04-1945 Sex: male Admission Date (Current Location): 01/01/2024  [facility] and [place] Number:  Producer, television/film/video and Address:  [employer] [facility], [address], [place], [place] [address]      Provider Number: [id number]  Attending Physician Name and Address:  Leotis Bogus, MD  Relative Name and Phone Number:  Olander Friedl (daughter) [phone] [name] Collum (spouse) [phone]    Current Level of Care: Hospital Recommended Level of Care: Skilled Nursing Facility Prior Approval Number:    Date Approved/Denied:   PASRR Number: [id number]  Discharge Plan: SNF    Current Diagnoses: Patient Active Problem List   Diagnosis Date Noted   Failure to thrive in adult 01/02/2024   Hypoglycemia 01/02/2024   History of CAD (coronary artery disease) 01/02/2024   History of gastrointestinal ulcer 01/02/2024   Generalized anxiety disorder 01/02/2024   Troponin level elevated 11/18/2023   Chronic anemia 09/04/2023   Chronic combined systolic and diastolic CHF (congestive heart failure) (HCC) 09/04/2023   Acute on chronic combined systolic and diastolic CHF (congestive heart failure) (HCC) 05/12/2023   Malnutrition of moderate degree 05/06/2023   Diabetic foot infection (HCC) 05/05/2023   Sacral wound 05/05/2023   Pleural effusion on right 05/05/2023   Elevated TSH 05/05/2023   Acute on chronic diastolic (congestive) heart failure (HCC) 05/05/2023   Diastolic CHF (HCC) 05/04/2023   Class 1 obesity 06/30/2022   Acute on chronic diastolic CHF (congestive heart failure) (HCC) 06/29/2022   PAD (peripheral artery disease) (HCC) 06/13/2022   Critical limb ischemia of left lower extremity (HCC) 03/21/2021   Nonhealing ulcer of left lower extremity limited to breakdown of skin (HCC) 03/21/2021   Abscess of left foot 08/03/2020   Osteomyelitis of fifth  toe of left foot (HCC)    Mixed hyperlipidemia 10/15/2018   Bilateral leg edema 09/17/2018   Stage 3b chronic kidney disease (HCC) 09/10/2018   Anemia 09/10/2018   Pain and swelling of wrist, right 08/14/2018   Acute kidney injury superimposed on chronic kidney disease (HCC) 08/06/2018   Bilateral carotid artery stenosis 07/30/2018   Coronary artery disease involving native coronary artery of native heart without angina pectoris 07/30/2018   Snoring 02/11/2018   Chronic heart failure with preserved ejection fraction (HFpEF) (HCC) 12/22/2017   At risk for adverse drug reaction 12/15/2017   Chronic pain syndrome 12/15/2017   Status post coronary artery stent placement    Pneumonia due to infectious organism 03/30/2017   Insulin  dependent type 2 diabetes mellitus (HCC) 03/29/2017   Acute respiratory failure with hypoxemia (HCC) 03/29/2017   Acute on chronic respiratory failure with hypoxia (HCC)    Pulmonary congestion    Acquired contracture of Achilles tendon, right 08/19/2016   Amputated great toe, right (HCC) 07/18/2016   Onychomycosis 05/29/2016   Diabetic polyneuropathy associated with type 2 diabetes mellitus (HCC) 04/09/2016   Right foot ulcer, limited to breakdown of skin (HCC) 04/09/2016   Spinal stenosis of lumbar region without neurogenic claudication 05/24/2014   Peripheral neuropathy 09/15/2013   Malaise 08/14/2013   Essential hypertension 08/14/2013   Chronic back pain 08/14/2013   Glaucoma 08/14/2013   Headache 08/14/2013    Orientation RESPIRATION BLADDER Height & Weight     Self, Time, Situation, Place  O2 (2 liters Nasal Cannula) Incontinent, External catheter (External Urinary Catheter) Weight: 187 lb 6.3 oz (85 kg) Height:  6' 2 (  188 cm)  BEHAVIORAL SYMPTOMS/MOOD NEUROLOGICAL BOWEL NUTRITION STATUS        Diet (Please see discharge summary)  AMBULATORY STATUS COMMUNICATION OF NEEDS Skin     Verbally Other (Comment)  (Erythema,Leg,Foot,Sacrum,Bil.,Wound/Incision LDAs)                       Personal Care Assistance Level of Assistance  Bathing, Dressing, Feeding Bathing Assistance: Maximum assistance Feeding assistance: Limited assistance Dressing Assistance: Maximum assistance     Functional Limitations Info  Hearing, Speech, Sight Sight Info: Impaired Hearing Info: Adequate Speech Info: Adequate    SPECIAL CARE FACTORS FREQUENCY  PT (By licensed PT), OT (By licensed OT)     PT Frequency: 5x min weekly OT Frequency: 5x min weekly            Contractures Contractures Info: Not present    Additional Factors Info  Code Status, Allergies, Psychotropic Code Status Info: FULL Allergies Info: Nsaids Psychotropic Info: gabapentin  (NEURONTIN ) capsule 200 mg 3 times daily,sertraline  (ZOLOFT ) tablet 25 mg daily,traZODone  (DESYREL ) tablet 100 mg daily at bedtime         Current Medications (01/03/2024):  This is the current hospital active medication list Current Facility-Administered Medications  Medication Dose Route Frequency Provider Last Rate Last Admin   acetaminophen  (TYLENOL ) tablet 650 mg  650 mg Oral Q6H PRN Sundil, Subrina, MD   650 mg at 01/02/24 2034   Or   acetaminophen  (TYLENOL ) suppository 650 mg  650 mg Rectal Q6H PRN Sundil, Subrina, MD       albuterol  (PROVENTIL ) (2.5 MG/3ML) 0.083% nebulizer solution 3 mL  3 mL Inhalation Q6H PRN Sundil, Subrina, MD       aspirin  EC tablet 81 mg  81 mg Oral Daily Sundil, Subrina, MD   81 mg at 01/03/24 1113   atorvastatin  (LIPITOR ) tablet 80 mg  80 mg Oral QHS Sundil, Subrina, MD   80 mg at 01/02/24 2156   ciprofloxacin -fluocinolone  OTIC (EAR) solution 0.3%-0.025%  0.25 mL Left EAR BID Sundil, Subrina, MD   0.25 mL at 01/03/24 1134   cyanocobalamin  (VITAMIN B12) tablet 2,000 mcg  2,000 mcg Oral Daily Sundil, Subrina, MD   2,000 mcg at 01/03/24 1112   dicyclomine  (BENTYL ) capsule 10 mg  10 mg Oral TID AC & HS Sundil, Subrina, MD   10 mg  at 01/03/24 1135   docusate sodium  (COLACE) capsule 100 mg  100 mg Oral BID Sundil, Subrina, MD   100 mg at 01/02/24 2154   gabapentin  (NEURONTIN ) capsule 200 mg  200 mg Oral TID Sundil, Subrina, MD   200 mg at 01/03/24 1111   isosorbide -hydrALAZINE  (BIDIL ) 20-37.5 MG per tablet 2 tablet  2 tablet Oral TID Sundil, Subrina, MD   2 tablet at 01/03/24 1113   latanoprost  (XALATAN ) 0.005 % ophthalmic solution 1 drop  1 drop Both Eyes QHS Sundil, Subrina, MD   1 drop at 01/03/24 1115   lubiprostone  (AMITIZA ) capsule 24 mcg  24 mcg Oral Q breakfast Sundil, Subrina, MD   24 mcg at 01/03/24 1134   menthol -cetylpyridinium (CEPACOL) lozenge 3 mg  1 lozenge Oral PRN Sundil, Subrina, MD       methocarbamol  (ROBAXIN ) tablet 500 mg  500 mg Oral Q6H PRN Sundil, Subrina, MD   500 mg at 01/03/24 1111   metoCLOPramide  (REGLAN ) tablet 5 mg  5 mg Oral TID Sundil, Subrina, MD   5 mg at 01/03/24 1114   ondansetron  (ZOFRAN ) tablet 4 mg  4 mg  Oral Q6H PRN Sundil, Subrina, MD       Or   ondansetron  (ZOFRAN ) injection 4 mg  4 mg Intravenous Q6H PRN Sundil, Subrina, MD       pantoprazole  (PROTONIX ) EC tablet 40 mg  40 mg Oral Daily Sundil, Subrina, MD   40 mg at 01/03/24 1111   phenol (CHLORASEPTIC) mouth spray 1 spray  1 spray Mouth/Throat PRN Sundil, Subrina, MD   1 spray at 01/02/24 0447   polyethylene glycol (MIRALAX  / GLYCOLAX ) packet 17 g  17 g Oral Daily Sundil, Subrina, MD   17 g at 01/02/24 1043   sertraline  (ZOLOFT ) tablet 25 mg  25 mg Oral Daily Sundil, Subrina, MD   25 mg at 01/03/24 1113   sodium chloride  flush (NS) 0.9 % injection 3 mL  3 mL Intravenous Q12H Sundil, Subrina, MD   3 mL at 01/03/24 1114   sodium chloride  flush (NS) 0.9 % injection 3 mL  3 mL Intravenous Q12H Sundil, Subrina, MD   3 mL at 01/03/24 1114   sodium chloride  flush (NS) 0.9 % injection 3 mL  3 mL Intravenous PRN Sundil, Subrina, MD       traZODone  (DESYREL ) tablet 100 mg  100 mg Oral QHS Sundil, Subrina, MD   100 mg at 01/02/24 2203      Discharge Medications: Please see discharge summary for a list of discharge medications.  Relevant Imaging Results:  Relevant Lab Results:   Additional Information [id number]  Isaiah Public, LCSWA

## 2024-01-03 NOTE — Progress Notes (Signed)
 Patient states he would like to get his wound care done tomorrow around noon, and would like to be pre-medicated with his prn Tramadol  PO prior his wound care. Will pass this information to day shift RN for she/he to follow.

## 2024-01-03 NOTE — TOC Initial Note (Addendum)
 Transition of Care [facility]) - Initial/Assessment Note    Patient Details  Name: David [name] MRN: [id number] Date of Birth: [birth year]  Transition of Care [facility]) CM/SW Contact:    Isaiah Public, LCSWA Phone Number: 01/03/2024, 12:16 PM  Clinical Narrative:                  CSW received consult for possible SNF placement at time of discharge. CSW spoke with patient regarding PT recommendation of SNF placement at time of discharge. Patient reports PTA he comes from [facility].Patient expressed understanding of PT recommendation and would like to return to [facility]  at time of discharge and agreeable to receive short term rehab when he returns.  CSW discussed insurance authorization process.  No further questions reported at this time. CSW to continue to follow and assist with discharge planning needs.    Expected Discharge Plan: Skilled Nursing Facility Barriers to Discharge: Continued Medical Work up   Patient Goals and CMS Choice Patient states their goals for this hospitalization and ongoing recovery are:: to return to [place]   Choice offered to / list presented to : Patient      Expected Discharge Plan and Services In-house Referral: Clinical Social Work     Living arrangements for the past 2 months:  (from [place])                                      Prior Living Arrangements/Services Living arrangements for the past 2 months:  (from [place])   Patient language and need for interpreter reviewed:: Yes        Need for Family Participation in Patient Care: Yes (Comment) Care giver support system in place?: Yes (comment)   Criminal Activity/Legal Involvement Pertinent to Current Situation/Hospitalization: No - Comment as needed  Activities of Daily Living   ADL Screening (condition at time of admission) Independently performs ADLs?: No Does the patient have a NEW difficulty with bathing/dressing/toileting/self-feeding that is expected to  last >3 days?: No Does the patient have a NEW difficulty with getting in/out of bed, walking, or climbing stairs that is expected to last >3 days?: No Does the patient have a NEW difficulty with communication that is expected to last >3 days?: No Is the patient deaf or have difficulty hearing?: No Does the patient have difficulty seeing, even when wearing glasses/contacts?: No Does the patient have difficulty concentrating, remembering, or making decisions?: No  Permission Sought/Granted Permission sought to share information with : Case Manager, Magazine features editor, Family Supports Permission granted to share information with : Yes, Verbal Permission Granted     Permission granted to share info w AGENCY: SNF        Emotional Assessment   Attitude/Demeanor/Rapport: Gracious Affect (typically observed): Calm Orientation: : Oriented to Self, Oriented to Place, Oriented to  Time, Oriented to Situation Alcohol / Substance Use: Not Applicable Psych Involvement: No (comment)  Admission diagnosis:  AKI (acute kidney injury) (HCC) [N17.9] Acute kidney injury superimposed on chronic kidney disease (HCC) [N17.9, N18.9] Unable to feed self [R63.39] Patient Active Problem List   Diagnosis Date Noted   Failure to thrive in adult 01/02/2024   Hypoglycemia 01/02/2024   History of CAD (coronary artery disease) 01/02/2024   History of gastrointestinal ulcer 01/02/2024   Generalized anxiety disorder 01/02/2024   Troponin level elevated 11/18/2023   Chronic anemia 09/04/2023   Chronic combined systolic and diastolic  CHF (congestive heart failure) (HCC) 09/04/2023   Acute on chronic combined systolic and diastolic CHF (congestive heart failure) (HCC) 05/12/2023   Malnutrition of moderate degree 05/06/2023   Diabetic foot infection (HCC) 05/05/2023   Sacral wound 05/05/2023   Pleural effusion on right 05/05/2023   Elevated TSH 05/05/2023   Acute on chronic diastolic (congestive)  heart failure (HCC) 05/05/2023   Diastolic CHF (HCC) 05/04/2023   Class 1 obesity 06/30/2022   Acute on chronic diastolic CHF (congestive heart failure) (HCC) 06/29/2022   PAD (peripheral artery disease) (HCC) 06/13/2022   Critical limb ischemia of left lower extremity (HCC) 03/21/2021   Nonhealing ulcer of left lower extremity limited to breakdown of skin (HCC) 03/21/2021   Abscess of left foot 08/03/2020   Osteomyelitis of fifth toe of left foot (HCC)    Mixed hyperlipidemia 10/15/2018   Bilateral leg edema 09/17/2018   Stage 3b chronic kidney disease (HCC) 09/10/2018   Anemia 09/10/2018   Pain and swelling of wrist, right 08/14/2018   Acute kidney injury superimposed on chronic kidney disease (HCC) 08/06/2018   Bilateral carotid artery stenosis 07/30/2018   Coronary artery disease involving native coronary artery of native heart without angina pectoris 07/30/2018   Snoring 02/11/2018   Chronic heart failure with preserved ejection fraction (HFpEF) (HCC) 12/22/2017   At risk for adverse drug reaction 12/15/2017   Chronic pain syndrome 12/15/2017   Status post coronary artery stent placement    Pneumonia due to infectious organism 03/30/2017   Insulin  dependent type 2 diabetes mellitus (HCC) 03/29/2017   Acute respiratory failure with hypoxemia (HCC) 03/29/2017   Acute on chronic respiratory failure with hypoxia (HCC)    Pulmonary congestion    Acquired contracture of Achilles tendon, right 08/19/2016   Amputated great toe, right (HCC) 07/18/2016   Onychomycosis 05/29/2016   Diabetic polyneuropathy associated with type 2 diabetes mellitus (HCC) 04/09/2016   Right foot ulcer, limited to breakdown of skin (HCC) 04/09/2016   Spinal stenosis of lumbar region without neurogenic claudication 05/24/2014   Peripheral neuropathy 09/15/2013   Malaise 08/14/2013   Essential hypertension 08/14/2013   Chronic back pain 08/14/2013   Glaucoma 08/14/2013   Headache 08/14/2013   PCP:  Freddrick,  No Pharmacy:   [facility] - [place], [place] - [address] [place] [address] Phone: [phone] Fax: [phone]     Social Drivers of Health (SDOH) Social History: SDOH Screenings   Food Insecurity: No Food Insecurity (01/02/2024)  Housing: Low Risk  (01/02/2024)  Transportation Needs: No Transportation Needs (01/02/2024)  Utilities: Not At Risk (01/02/2024)  Alcohol Screen: Low Risk  (05/18/2023)  Financial Resource Strain: Low Risk  (05/18/2023)  Physical Activity: Inactive (08/20/2017)  Social Connections: Socially Integrated (01/02/2024)  Stress: No Stress Concern Present (08/20/2017)  Tobacco Use: Medium Risk (01/02/2024)   SDOH Interventions:     Readmission Risk Interventions    11/09/2023   11:30 AM 11/06/2023    8:44 AM  Readmission Risk Prevention Plan  Transportation Screening Complete Complete  Medication Review (RN Care Manager) Complete Complete  PCP or Specialist appointment within 3-5 days of discharge Complete   HRI or Home Care Consult Complete Complete  SW Recovery Care/Counseling Consult Complete Complete  Palliative Care Screening Not Applicable Not Applicable  Skilled Nursing Facility Complete Not Applicable

## 2024-01-03 NOTE — Progress Notes (Signed)
 PROGRESS NOTE    David [name]  FMW:[id number] DOB: Jun 11, 1945 DOA: 01/01/2024 PCP: Pcp, No   Brief Narrative: This 78 yrs old Male with medical history significant for combined CHF, reduced EF 30 to 35%, chronic microcytic anemia, Essential hypertension, CKD stage IV, CAD s/p cardiac stents, insulin -dependent DM type II, and chronic diabetic foot ulcers bilateral lower extremities presented in the ED for the evaluation for decreased oral intake, low hemoglobin 7.2 and hypoglycemia with blood glucose 66. EMS gave glucagon with improvement of blood glucose to 80.  Patient states that skilled nursing facility/nursing home is not feeding him well and he is unable to feed himself.  Patient is stating that nursing home has not feed him or give any meal for last 5 days.  Patient and family is requesting different SNF facility for placement.  Patient found to have AKI on CKD stage IV.  He is continued on IV fluid resuscitation.  Patient was seen and examined at bedside.  He appears at his baseline denies any concerns.   Assessment & Plan:   Principal Problem:   Acute kidney injury superimposed on chronic kidney disease (HCC) Active Problems:   Failure to thrive in adult   Essential hypertension   Insulin  dependent type 2 diabetes mellitus (HCC)   Peripheral neuropathy   Diabetic polyneuropathy associated with type 2 diabetes mellitus (HCC)   Nonhealing ulcer of left lower extremity limited to breakdown of skin (HCC)   Chronic anemia   Chronic combined systolic and diastolic CHF (congestive heart failure) (HCC)   Hypoglycemia   History of CAD (coronary artery disease)   History of gastrointestinal ulcer   Generalized anxiety disorder  AKI on CKD stage IV: Patient presented in the ED complaining of unable to feed himself at nursing home, states SNF is not giving him food almost for last 5 days.  EMS found patient was hypoglycemic,  blood glucose 66 which improved to 100 after giving glucagon.   Patient is hemodynamically stable. Currently requiring oxygen 2 L O2 sat 100%.  Blood glucose 111.  CMP showing elevated creatinine 3.11 and low GFR 30.  Low calcium  8.3.  Low albumin  2.4. CBC showing hemoglobin 7.2 (baseline hemoglobin around 7-8), elevated WBC count 12 and normal platelet count. In the ED patient received 1 L of LR bolus. Continue IV fluid resuscitation.  Serum creatinine improving.  Avoid nephrotoxic medications.  Failure to thrive in adult: He reports he is unable to feed himself due to generalized weakness.   Concern for failure to thrive in adult.   Consulting inpatient PT and OT for evaluation and possible long-term assisted living facility placement.   Hypoglycemia-resolved: Insulin -dependent DM type II Initial blood glucose was 66 which improved to 100 after giving glucagon per EMS report.  Most recent A1c 6 in June 2025. Hold Lantus  in the setting of hypoglycemia.     Anemia of chronic disease: History of GI ulcer on most recent EGD 12/06/2023 Stable H&H 7.2 and 23.  Elevated MCV 115.  Baseline hemoglobin around 7-8.   History of chronic anemia in the setting of CKD and history of GI ulcer. Hb dropped to 6.9.  Transfuse 1 unit PRBC, follow-up H&H.  No obvious visible bleeding noted     Peripheral neuropathy: Continue gabapentin    Generalized anxiety disorder Continue Zoloft    Chronic constipation Diabetic gastroparesis Continue Reglan  5 mg 3 times daily, maintenance L milligram 3 times daily.   Combined systolic and diastolic heart reduced EF less than 30 to  35% Blood pressure within good range.  Continue BiDil  3 times daily.   History of CAD Continue aspirin  and Lipitor .   Bilateral lower extremities chronic diabetic ulcer Consulting wound care for dressing change and further management.   Sore throat and left-sided ear pain Continue ciprofloxacin  eardrop twice daily for 5 days.      DVT prophylaxis: SCDs Code Status: Full code Family  Communication: No family at bed side Disposition Plan:    Status is: Inpatient Remains inpatient appropriate because:  Needs placement in different SNF facility   Consultants:  None  Procedures: None  Antimicrobials:  Anti-infectives (From admission, onward)    None      Subjective: Patient was seen and examined at bedside.  Overnight events noted. Patient reports feeling improved.  Patient reported he wanted to have a regular diet instead of carb modified diet.   Objective: Vitals:   01/02/24 2018 01/03/24 0034 01/03/24 0451 01/03/24 0821  BP: (!) 156/79 (!) 148/81 135/72 (!) 156/82  Pulse: 74 71 65 68  Resp: 16 16 16    Temp: 98 F (36.7 C) (!) 97.5 F (36.4 C) 99.9 F (37.7 C) 98.7 F (37.1 C)  TempSrc: Oral Oral Oral Oral  SpO2: 100% 100% 100% 98%  Weight:      Height:        Intake/Output Summary (Last 24 hours) at 01/03/2024 1120 Last data filed at 01/03/2024 0451 Gross per 24 hour  Intake 2543.97 ml  Output 900 ml  Net 1643.97 ml   Filed Weights   01/01/24 1816  Weight: 85 kg    Examination:  General exam: Appears calm and comfortable, not in any acute distress, deconditioned. Respiratory system: Clear to auscultation. Respiratory effort normal.  RR 15 Cardiovascular system: S1 & S2 heard, RRR. No JVD, murmurs, rubs, gallops or clicks.  Gastrointestinal system: Abdomen is non distended, soft and non tender.  Normal bowel sounds heard. Central nervous system: Alert and oriented x 3. No focal neurological deficits. Extremities: No edema, no cyanosis, no clubbing. Skin: No rashes, lesions or ulcers Psychiatry: Judgement and insight appear normal. Mood & affect appropriate.     Data Reviewed: I have personally reviewed following labs and imaging studies  CBC: Recent Labs  Lab 01/01/24 2015 01/02/24 1130  WBC 12.8* 8.2  NEUTROABS 10.6*  --   HGB 7.2* 6.9*  HCT 23.6* 22.0*  MCV 115.7* 114.6*  PLT 232 229   Basic Metabolic Panel: Recent  Labs  Lab 01/01/24 1947 01/02/24 0110  NA 139 138  K 4.8 4.4  CL 108 107  CO2 22 23  GLUCOSE 110* 143*  BUN 51* 50*  CREATININE 3.11* 2.99*  CALCIUM  8.3* 8.1*   GFR: Estimated Creatinine Clearance: 24.4 mL/min (A) (by C-G formula based on SCr of 2.99 mg/dL (H)). Liver Function Tests: Recent Labs  Lab 01/01/24 1947 01/02/24 0110  AST 30 39  ALT 30 33  ALKPHOS 97 105  BILITOT 0.5 0.8  PROT 7.5 7.8  ALBUMIN  2.4* 2.6*   No results for input(s): LIPASE, AMYLASE in the last 168 hours. No results for input(s): AMMONIA in the last 168 hours. Coagulation Profile: No results for input(s): INR, PROTIME in the last 168 hours. Cardiac Enzymes: No results for input(s): CKTOTAL, CKMB, CKMBINDEX, TROPONINI in the last 168 hours. BNP (last 3 results) No results for input(s): PROBNP in the last 8760 hours. HbA1C: No results for input(s): HGBA1C in the last 72 hours. CBG: Recent Labs  Lab 01/02/24 1128 01/02/24 1727  01/03/24 0035 01/03/24 0452 01/03/24 0815  GLUCAP 167* 156* 126* 114* 108*   Lipid Profile: No results for input(s): CHOL, HDL, LDLCALC, TRIG, CHOLHDL, LDLDIRECT in the last 72 hours. Thyroid  Function Tests: No results for input(s): TSH, T4TOTAL, FREET4, T3FREE, THYROIDAB in the last 72 hours. Anemia Panel: No results for input(s): VITAMINB12, FOLATE, FERRITIN, TIBC, IRON , RETICCTPCT in the last 72 hours. Sepsis Labs: No results for input(s): PROCALCITON, LATICACIDVEN in the last 168 hours.  No results found for this or any previous visit (from the past 240 hours).   Radiology Studies: No results found.  Scheduled Meds:  aspirin  EC  81 mg Oral Daily   atorvastatin   80 mg Oral QHS   ciprofloxacin -fluocinolone  PF  0.25 mL Left EAR BID   cyanocobalamin   2,000 mcg Oral Daily   dicyclomine   10 mg Oral TID AC & HS   docusate sodium   100 mg Oral BID   gabapentin   200 mg Oral TID   isosorbide -hydrALAZINE    2 tablet Oral TID   latanoprost   1 drop Both Eyes QHS   lubiprostone   24 mcg Oral Q breakfast   metoCLOPramide   5 mg Oral TID   pantoprazole   40 mg Oral Daily   polyethylene glycol  17 g Oral Daily   sertraline   25 mg Oral Daily   sodium chloride  flush  3 mL Intravenous Q12H   sodium chloride  flush  3 mL Intravenous Q12H   traZODone   100 mg Oral QHS   Continuous Infusions:   LOS: 1 day    Time spent: 50 mins    Darcel Dawley, MD [facility]   If 7PM-7AM, please contact night-coverage

## 2024-01-03 NOTE — Evaluation (Signed)
 Occupational Therapy Evaluation Patient Details Name: David [name] MRN: [id number] DOB: Jun 23, 1945 Today's Date: 01/03/2024   History of Present Illness   78 y.o. male presenting 8/15 from [place] where he was reported to be lethargic with poor PO intake and labs showing HGB of 6.2. Found to have AKI, FTT, hypoglycemia. PMH significant of CAD, combined CHF, HTN, L fifth metatarsal amputation, chronic BLE ulcers, DMII, diabetic neuropathy, diabetic retinopathy, glaucoma, and CKD     Clinical Impressions PTA, pt was at [facility] facility and was receiving assist with BADL and working on STS transfers. Pt reports max difficulty with self feeding due to poor depth perception and low vision. Upon eval, pt with generalized weakness in extremities and core, poor balance, and decreased activity tolerance. Pt needing max-total A for BADL and max A for bed mobility as well as mod-max A for sitting balance at EOB. Pt reports prior to November 2024 he was independent. Patient will benefit from continued inpatient follow up therapy, <3 hours/day      If plan is discharge home, recommend the following:   Two people to help with walking and/or transfers;Two people to help with bathing/dressing/bathroom;Assistance with cooking/housework;Direct supervision/assist for medications management;Direct supervision/assist for financial management;Assist for transportation;Help with stairs or ramp for entrance;Assistance with feeding     Functional Status Assessment   Patient has had a recent decline in their functional status and demonstrates the ability to make significant improvements in function in a reasonable and predictable amount of time.     Equipment Recommendations   Other (comment) (defer)     Recommendations for Other Services         Precautions/Restrictions   Precautions Precautions: Fall Recall of Precautions/Restrictions: Intact Restrictions Weight Bearing  Restrictions Per Provider Order: No     Mobility Bed Mobility Overal bed mobility: Needs Assistance Bed Mobility: Supine to Sit     Supine to sit: Max assist     General bed mobility comments: cues for sequencing in light of physical deficits    Transfers                   General transfer comment: deferred      Balance Overall balance assessment: Needs assistance Sitting-balance support: Bilateral upper extremity supported, Feet supported Sitting balance-Leahy Scale: Poor Sitting balance - Comments: min-max A for static seated balance.                                   ADL either performed or assessed with clinical judgement   ADL Overall ADL's : Needs assistance/impaired Eating/Feeding: Moderate assistance;Bed level   Grooming: Minimal assistance;Bed level   Upper Body Bathing: Sitting;Bed level;Moderate assistance   Lower Body Bathing: Total assistance;+2 for physical assistance;+2 for safety/equipment;Bed level   Upper Body Dressing : Maximal assistance;Bed level   Lower Body Dressing: Total assistance;+2 for physical assistance;+2 for safety/equipment                       Vision Baseline Vision/History: 2 Legally blind Ability to See in Adequate Light: 3 Highly impaired Patient Visual Report: No change from baseline Additional Comments: able to detect outlines of items in room, but reports max difficulty with depth perception. reports he cannot see out of R eye and closes it often. Unsure if also has some diplopia at times. pt also reporting that L eye vision is not great affecting ability  to self feed     Perception         Praxis         Pertinent Vitals/Pain Pain Assessment Pain Assessment: Faces Faces Pain Scale: Hurts even more Pain Location: generalized with movement Pain Descriptors / Indicators: Discomfort Pain Intervention(s): Limited activity within patient's tolerance, Monitored during session      Extremity/Trunk Assessment Upper Extremity Assessment Upper Extremity Assessment: Generalized weakness (pt with bilat neuropathy in hands, wears gloves, limited dexterity/grip)   Lower Extremity Assessment Lower Extremity Assessment: Defer to PT evaluation       Communication Communication Communication: No apparent difficulties   Cognition Arousal: Alert Behavior During Therapy: WFL for tasks assessed/performed Cognition: Cognition impaired     Awareness: Intellectual awareness intact, Online awareness impaired Memory impairment (select all impairments): Short-term memory Attention impairment (select first level of impairment): Sustained attention, Selective attention Executive functioning impairment (select all impairments): Organization, Sequencing, Problem solving OT - Cognition Comments: pt follows one step commands, is oriented. needs cues for problem solving during mobility. Poor insight into rehab process at times. can be self limiting                 Following commands: Intact       Cueing  General Comments   Cueing Techniques: Verbal cues;Tactile cues  4L O2 via Lee   Exercises     Shoulder Instructions      Home Living Family/patient expects to be discharged to:: Skilled nursing facility                                 Additional Comments: Pt is currently at [facility] however family requesting different placement. per pt, was at home prior to November      Prior Functioning/Environment Prior Level of Function : Needs assist             Mobility Comments: reports he was working with therapy and able to sit EOB and lateral scoot to/from bed/w/c ADLs Comments: assist from rehab staff    OT Problem List: Decreased strength;Decreased activity tolerance;Impaired balance (sitting and/or standing);Decreased cognition;Decreased safety awareness;Decreased knowledge of use of DME or AE;Impaired sensation;Impaired UE functional use    OT Treatment/Interventions: Self-care/ADL training;Therapeutic exercise;DME and/or AE instruction;Therapeutic activities;Balance training;Patient/family education      OT Goals(Current goals can be found in the care plan section)   Acute Rehab OT Goals Patient Stated Goal: get better OT Goal Formulation: With patient Time For Goal Achievement: 01/17/24 Potential to Achieve Goals: Fair   OT Frequency:  Min 1X/week    Co-evaluation              AM-PAC OT 6 Clicks Daily Activity     Outcome Measure Help from another person eating meals?: A Lot Help from another person taking care of personal grooming?: A Little Help from another person toileting, which includes using toliet, bedpan, or urinal?: Total Help from another person bathing (including washing, rinsing, drying)?: Total Help from another person to put on and taking off regular upper body clothing?: A Lot Help from another person to put on and taking off regular lower body clothing?: Total 6 Click Score: 10   End of Session Nurse Communication: Mobility status  Activity Tolerance: Patient tolerated treatment well Patient left: in bed;with call bell/phone within reach;with bed alarm set  OT Visit Diagnosis: Unsteadiness on feet (R26.81);Muscle weakness (generalized) (M62.81);Low vision, both eyes (H54.2);Feeding difficulties (R63.3);Other symptoms  and signs involving cognitive function                Time: 8499-8471 OT Time Calculation (min): 28 min Charges:  OT General Charges $OT Visit: 1 Visit OT Evaluation $OT Eval Moderate Complexity: 1 Mod OT Treatments $Self Care/Home Management : 8-22 mins  Elma JONETTA Lebron FREDERICK, OTR/L [facility] Acute Rehabilitation Office: [phone]   Elma JONETTA Lebron 01/03/2024, 4:24 PM

## 2024-01-04 DIAGNOSIS — N189 Chronic kidney disease, unspecified: Secondary | ICD-10-CM | POA: Diagnosis not present

## 2024-01-04 DIAGNOSIS — N179 Acute kidney failure, unspecified: Secondary | ICD-10-CM | POA: Diagnosis not present

## 2024-01-04 LAB — BASIC METABOLIC PANEL WITH GFR
Anion gap: 5 (ref 5–15)
BUN: 36 mg/dL — ABNORMAL HIGH (ref 8–23)
CO2: 24 mmol/L (ref 22–32)
Calcium: 7.9 mg/dL — ABNORMAL LOW (ref 8.9–10.3)
Chloride: 110 mmol/L (ref 98–111)
Creatinine, Ser: 2.29 mg/dL — ABNORMAL HIGH (ref 0.61–1.24)
GFR, Estimated: 29 mL/min — ABNORMAL LOW (ref >60)
Glucose, Bld: 158 mg/dL — ABNORMAL HIGH (ref 70–99)
Potassium: 4.6 mmol/L (ref 3.5–5.1)
Sodium: 139 mmol/L (ref 135–145)

## 2024-01-04 LAB — HEMOGLOBIN AND HEMATOCRIT, BLOOD
HCT: 31.4 % — ABNORMAL LOW (ref 39.0–52.0)
Hemoglobin: 9.7 g/dL — ABNORMAL LOW (ref 13.0–17.0)

## 2024-01-04 LAB — PHOSPHORUS: Phosphorus: 3.1 mg/dL (ref 2.5–4.6)

## 2024-01-04 LAB — GLUCOSE, CAPILLARY
Glucose-Capillary: 125 mg/dL — ABNORMAL HIGH (ref 70–99)
Glucose-Capillary: 111 mg/dL — ABNORMAL HIGH (ref 70–99)
Glucose-Capillary: 146 mg/dL — ABNORMAL HIGH (ref 70–99)
Glucose-Capillary: 122 mg/dL — ABNORMAL HIGH (ref 70–99)
Glucose-Capillary: 138 mg/dL — ABNORMAL HIGH (ref 70–99)

## 2024-01-04 LAB — TYPE AND SCREEN
ABO/RH(D): O POS
Antibody Screen: NEGATIVE

## 2024-01-04 LAB — MAGNESIUM: Magnesium: 2 mg/dL (ref 1.7–2.4)

## 2024-01-04 LAB — CBC
HCT: 22 % — ABNORMAL LOW (ref 39.0–52.0)
Hemoglobin: 6.8 g/dL — CL (ref 13.0–17.0)
MCH: 33.7 pg (ref 26.0–34.0)
MCHC: 30.9 g/dL (ref 30.0–36.0)
MCV: 108.9 fL — ABNORMAL HIGH (ref 80.0–100.0)
Platelets: 193 K/uL (ref 150–400)
RBC: 2.02 MIL/uL — ABNORMAL LOW (ref 4.22–5.81)
RDW: 23.5 % — ABNORMAL HIGH (ref 11.5–15.5)
WBC: 7.1 K/uL (ref 4.0–10.5)
nRBC: 0 % (ref 0.0–0.2)

## 2024-01-04 LAB — PREPARE RBC (CROSSMATCH)

## 2024-01-04 MED ORDER — SODIUM CHLORIDE 0.9% IV SOLUTION: Freq: Once | INTRAVENOUS | Status: AC

## 2024-01-04 NOTE — Progress Notes (Signed)
 Hgb is 6.8 this morning. Plan to transfuse 1 unit RBCs.

## 2024-01-04 NOTE — Progress Notes (Signed)
 PROGRESS NOTE    David [name]  FMW:[id number] DOB: Nov 14, 1945 DOA: 01/01/2024 PCP: Pcp, No   Brief Narrative: This 78 yrs old Male with medical history significant for combined CHF, reduced EF 30 to 35%, chronic microcytic anemia, Essential hypertension, CKD stage IV, CAD s/p cardiac stents, insulin -dependent DM type II, and chronic diabetic foot ulcers bilateral lower extremities presented in the ED for the evaluation for decreased oral intake, low hemoglobin 7.2 and hypoglycemia with blood glucose 66. EMS gave glucagon with improvement of blood glucose to 80.  Patient states that skilled nursing facility/nursing home is not feeding him well and he is unable to feed himself.  Patient is stating that nursing home has not feed him or give any meal for last 5 days.  Patient and family is requesting different SNF facility for placement.  Patient found to have AKI on CKD stage IV.  He is continued on IV fluid resuscitation.  Patient was seen and examined at bedside.  He appears at his baseline denies any concerns.   Assessment & Plan:   Principal Problem:   Acute kidney injury superimposed on chronic kidney disease (HCC) Active Problems:   Failure to thrive in adult   Essential hypertension   Insulin  dependent type 2 diabetes mellitus (HCC)   Peripheral neuropathy   Diabetic polyneuropathy associated with type 2 diabetes mellitus (HCC)   Nonhealing ulcer of left lower extremity limited to breakdown of skin (HCC)   Chronic anemia   Chronic combined systolic and diastolic CHF (congestive heart failure) (HCC)   Hypoglycemia   History of CAD (coronary artery disease)   History of gastrointestinal ulcer   Generalized anxiety disorder  AKI on CKD stage IV: Patient presented in the ED complaining of unable to feed himself at nursing home, states SNF is not giving him food almost for last 5 days.  EMS found patient was hypoglycemic,  blood glucose 66 which improved to 100 after giving glucagon.   Patient is hemodynamically stable. Currently requiring oxygen 2 L O2 sat 100%.  Blood glucose 111.  CMP showing elevated creatinine 3.11 and low GFR 30.  Low calcium  8.3.  Low albumin  2.4. CBC showing hemoglobin 7.2 (baseline hemoglobin around 7-8), elevated WBC count 12 and normal platelet count. In the ED patient received 1 L of LR bolus. Continue IV fluid resuscitation.  Serum creatinine improving.  Avoid nephrotoxic medications. Blood work was improved.  Patient is eating well in house.  Failure to thrive in adult: He reports he is unable to feed himself due to generalized weakness.   Concern for failure to thrive in adult.   Consulting inpatient PT and OT for evaluation and possible long-term assisted living facility placement.   Hypoglycemia-resolved: Insulin -dependent DM type II Initial blood glucose was 66 which improved to 100 after giving glucagon per EMS report.  Most recent A1c 6 in June 2025. Hold Lantus  in the setting of hypoglycemia.     Anemia of chronic disease: History of GI ulcer on most recent EGD 12/06/2023 Stable H&H 7.2 and 23.  Elevated MCV 115.  Baseline hemoglobin around 7-8.   History of chronic anemia in the setting of CKD and history of GI ulcer. Hb dropped to 6.9.  Transfuse 1 unit PRBC, follow-up H&H.  No obvious visible bleeding noted.     Peripheral neuropathy: Continue gabapentin .   Generalized anxiety disorder Continue Zoloft .   Chronic constipation Diabetic gastroparesis Continue Reglan  5 mg 3 times daily, maintenance L milligram 3 times daily.  Combined systolic and diastolic heart reduced EF less than 30 to 35% Blood pressure within good range.  Continue BiDil  3 times daily.   History of CAD: Continue aspirin  and Lipitor .   Bilateral lower extremities chronic diabetic ulcer Consulting wound care for dressing change and further management.   Sore throat and left-sided ear pain Continue ciprofloxacin  eardrop twice daily for 5 days.       DVT prophylaxis: SCDs Code Status: Full code Family Communication: No family at bed side. Disposition Plan:    Status is: Inpatient Remains inpatient appropriate because:  Needs placement in different SNF facility.   Consultants:  None  Procedures: None  Antimicrobials:  Anti-infectives (From admission, onward)    None      Subjective: Patient was seen and examined at bedside.  Overnight events noted. Patient reports feeling improved.  His hemoglobin dropped requiring blood transfusion.   Patient denies any visible notable bleeding.   Objective: Vitals:   01/04/24 0737 01/04/24 0755 01/04/24 1048 01/04/24 1049  BP: 125/73 121/69 119/73 119/73  Pulse: (!) 59 (!) 59 66 66  Resp: 16  16 16   Temp: 98.2 F (36.8 C) 97.7 F (36.5 C) (!) 97.5 F (36.4 C) (!) 97.5 F (36.4 C)  TempSrc: Axillary Oral Oral Oral  SpO2: 100% 100% 100%   Weight:      Height:        Intake/Output Summary (Last 24 hours) at 01/04/2024 1055 Last data filed at 01/04/2024 0910 Gross per 24 hour  Intake 337.17 ml  Output 400 ml  Net -62.83 ml   Filed Weights   01/01/24 1816  Weight: 85 kg    Examination:  General exam: Appears calm and comfortable, not in any acute distress, deconditioned. Respiratory system: CTA Bilaterally. Respiratory effort normal.  RR 14 Cardiovascular system: S1 & S2 heard, RRR. No JVD, murmurs, rubs, gallops or clicks.  Gastrointestinal system: Abdomen is non distended, soft and non tender.  Normal bowel sounds heard. Central nervous system: Alert and oriented x 3. No focal neurological deficits. Extremities: No edema, no cyanosis, no clubbing. Skin: No rashes, lesions or ulcers Psychiatry: Judgement and insight appear normal. Mood & affect appropriate.     Data Reviewed: I have personally reviewed following labs and imaging studies  CBC: Recent Labs  Lab 01/01/24 2015 01/02/24 1130 01/04/24 0250  WBC 12.8* 8.2 7.1  NEUTROABS 10.6*  --   --    HGB 7.2* 6.9* 6.8*  HCT 23.6* 22.0* 22.0*  MCV 115.7* 114.6* 108.9*  PLT 232 229 193   Basic Metabolic Panel: Recent Labs  Lab 01/01/24 1947 01/02/24 0110 01/04/24 0250  NA 139 138 139  K 4.8 4.4 4.6  CL 108 107 110  CO2 22 23 24   GLUCOSE 110* 143* 158*  BUN 51* 50* 36*  CREATININE 3.11* 2.99* 2.29*  CALCIUM  8.3* 8.1* 7.9*  MG  --   --  2.0  PHOS  --   --  3.1   GFR: Estimated Creatinine Clearance: 31.9 mL/min (A) (by C-G formula based on SCr of 2.29 mg/dL (H)). Liver Function Tests: Recent Labs  Lab 01/01/24 1947 01/02/24 0110  AST 30 39  ALT 30 33  ALKPHOS 97 105  BILITOT 0.5 0.8  PROT 7.5 7.8  ALBUMIN  2.4* 2.6*   No results for input(s): LIPASE, AMYLASE in the last 168 hours. No results for input(s): AMMONIA in the last 168 hours. Coagulation Profile: No results for input(s): INR, PROTIME in the last 168 hours. Cardiac  Enzymes: No results for input(s): CKTOTAL, CKMB, CKMBINDEX, TROPONINI in the last 168 hours. BNP (last 3 results) No results for input(s): PROBNP in the last 8760 hours. HbA1C: No results for input(s): HGBA1C in the last 72 hours. CBG: Recent Labs  Lab 01/03/24 1239 01/03/24 1751 01/03/24 2031 01/04/24 0458 01/04/24 0856  GLUCAP 139* 146* 154* 125* 111*   Lipid Profile: No results for input(s): CHOL, HDL, LDLCALC, TRIG, CHOLHDL, LDLDIRECT in the last 72 hours. Thyroid  Function Tests: No results for input(s): TSH, T4TOTAL, FREET4, T3FREE, THYROIDAB in the last 72 hours. Anemia Panel: No results for input(s): VITAMINB12, FOLATE, FERRITIN, TIBC, IRON , RETICCTPCT in the last 72 hours. Sepsis Labs: No results for input(s): PROCALCITON, LATICACIDVEN in the last 168 hours.  No results found for this or any previous visit (from the past 240 hours).   Radiology Studies: No results found.  Scheduled Meds:  aspirin  EC  81 mg Oral Daily   atorvastatin   80 mg Oral QHS    ciprofloxacin -fluocinolone  PF  0.25 mL Left EAR BID   cyanocobalamin   2,000 mcg Oral Daily   dicyclomine   10 mg Oral TID AC & HS   docusate sodium   100 mg Oral BID   gabapentin   200 mg Oral TID   isosorbide -hydrALAZINE   2 tablet Oral TID   latanoprost   1 drop Both Eyes QHS   lubiprostone   24 mcg Oral Q breakfast   metoCLOPramide   5 mg Oral TID   pantoprazole   40 mg Oral Daily   polyethylene glycol  17 g Oral Daily   sertraline   25 mg Oral Daily   sodium chloride  flush  3 mL Intravenous Q12H   sodium chloride  flush  3 mL Intravenous Q12H   traZODone   100 mg Oral QHS   Continuous Infusions:   LOS: 2 days    Time spent: 35 mins    Darcel Dawley, MD [facility]   If 7PM-7AM, please contact night-coverage

## 2024-01-04 NOTE — Progress Notes (Signed)
 Critical lab of Hbg 6.8 was received and provider was notified. 1 unit of blood was ordered by provider and started. Remained next to the patient during transfusion. Patient is stable, no c/o were obtained and no reaction noted so far. Will continue to monitor.

## 2024-01-04 NOTE — Progress Notes (Signed)
 Blood transfusion stopped at 1425. No reaction noted. Will continue to monitor through this shift.

## 2024-01-04 NOTE — Progress Notes (Signed)
 Blood transfusion started at 1048 am, RN at bed side. Will continue to monitor through this shift.

## 2024-01-04 NOTE — Plan of Care (Signed)
  Problem: Education: Goal: Knowledge of General Education information will improve Description: Including pain rating scale, medication(s)/side effects and non-pharmacologic comfort measures 01/04/2024 0308 by Evern Monica HERO, RN Outcome: Progressing 01/04/2024 0308 by Evern Monica HERO, RN Outcome: Progressing   Problem: Health Behavior/Discharge Planning: Goal: Ability to manage health-related needs will improve 01/04/2024 0308 by Evern Monica HERO, RN Outcome: Progressing 01/04/2024 0308 by Evern Monica HERO, RN Outcome: Progressing   Problem: Clinical Measurements: Goal: Ability to maintain clinical measurements within normal limits will improve 01/04/2024 0308 by Evern Monica HERO, RN Outcome: Progressing 01/04/2024 0308 by Evern Monica HERO, RN Outcome: Progressing Goal: Will remain free from infection 01/04/2024 0308 by Evern Monica HERO, RN Outcome: Progressing 01/04/2024 0308 by Evern Monica HERO, RN Outcome: Progressing Goal: Diagnostic test results will improve 01/04/2024 0308 by Evern Monica HERO, RN Outcome: Progressing 01/04/2024 0308 by Evern Monica HERO, RN Outcome: Progressing Goal: Respiratory complications will improve 01/04/2024 0308 by Evern Monica HERO, RN Outcome: Progressing 01/04/2024 0308 by Evern Monica HERO, RN Outcome: Progressing Goal: Cardiovascular complication will be avoided 01/04/2024 0308 by Evern Monica HERO, RN Outcome: Progressing 01/04/2024 0308 by Evern Monica HERO, RN Outcome: Progressing   Problem: Activity: Goal: Risk for activity intolerance will decrease 01/04/2024 0308 by Evern Monica HERO, RN Outcome: Progressing 01/04/2024 0308 by Evern Monica HERO, RN Outcome: Progressing   Problem: Nutrition: Goal: Adequate nutrition will be maintained 01/04/2024 0308 by Evern Monica HERO, RN Outcome: Progressing 01/04/2024 0308 by Evern Monica HERO, RN Outcome: Progressing   Problem: Coping: Goal: Level of anxiety will decrease 01/04/2024  0308 by Evern Monica HERO, RN Outcome: Progressing 01/04/2024 0308 by Evern Monica HERO, RN Outcome: Progressing   Problem: Elimination: Goal: Will not experience complications related to bowel motility 01/04/2024 0308 by Evern Monica HERO, RN Outcome: Progressing 01/04/2024 0308 by Evern Monica HERO, RN Outcome: Progressing Goal: Will not experience complications related to urinary retention 01/04/2024 0308 by Evern Monica HERO, RN Outcome: Progressing 01/04/2024 0308 by Evern Monica HERO, RN Outcome: Progressing   Problem: Pain Managment: Goal: General experience of comfort will improve and/or be controlled 01/04/2024 0308 by Evern Monica HERO, RN Outcome: Progressing 01/04/2024 0308 by Evern Monica HERO, RN Outcome: Progressing   Problem: Safety: Goal: Ability to remain free from injury will improve 01/04/2024 0308 by Evern Monica HERO, RN Outcome: Progressing 01/04/2024 0308 by Evern Monica HERO, RN Outcome: Progressing   Problem: Skin Integrity: Goal: Risk for impaired skin integrity will decrease 01/04/2024 0308 by Evern Monica HERO, RN Outcome: Progressing 01/04/2024 0308 by Evern Monica HERO, RN Outcome: Progressing

## 2024-01-04 NOTE — Progress Notes (Signed)
 Blood transfusion completed. Vitals signs were taken. WNL. Patient stable. Patient care transferred to day shift RN.

## 2024-01-04 NOTE — Plan of Care (Signed)
  Problem: Education: Goal: Knowledge of General Education information will improve Description: Including pain rating scale, medication(s)/side effects and non-pharmacologic comfort measures Outcome: Progressing   Problem: Clinical Measurements: Goal: Cardiovascular complication will be avoided Outcome: Progressing   Problem: Nutrition: Goal: Adequate nutrition will be maintained Outcome: Progressing   Problem: Coping: Goal: Level of anxiety will decrease Outcome: Progressing   Problem: Elimination: Goal: Will not experience complications related to bowel motility Outcome: Progressing   Problem: Pain Managment: Goal: General experience of comfort will improve and/or be controlled Outcome: Progressing   Problem: Safety: Goal: Ability to remain free from injury will improve Outcome: Progressing

## 2024-01-04 NOTE — Progress Notes (Signed)
 Blood is being transfused. RN at bed side. No reaction noted. Increased the rate. Will continue to monitor through this shift

## 2024-01-05 DIAGNOSIS — N179 Acute kidney failure, unspecified: Secondary | ICD-10-CM

## 2024-01-05 DIAGNOSIS — N189 Chronic kidney disease, unspecified: Secondary | ICD-10-CM | POA: Diagnosis not present

## 2024-01-05 LAB — TYPE AND SCREEN
ABO/RH(D): O POS
Antibody Screen: NEGATIVE
Unit division: 0
Unit division: 0
Unit division: 0

## 2024-01-05 LAB — BPAM RBC
Blood Product Expiration Date: 202509142359
Blood Product Expiration Date: 202509162359
Blood Product Expiration Date: 202509142359
ISSUE DATE / TIME: 202508180358
ISSUE DATE / TIME: 202508181041
ISSUE DATE / TIME: 202508161343
Unit Type and Rh: 5100
Unit Type and Rh: 5100
Unit Type and Rh: 5100

## 2024-01-05 LAB — GLUCOSE, CAPILLARY
Glucose-Capillary: 121 mg/dL — ABNORMAL HIGH (ref 70–99)
Glucose-Capillary: 149 mg/dL — ABNORMAL HIGH (ref 70–99)
Glucose-Capillary: 151 mg/dL — ABNORMAL HIGH (ref 70–99)
Glucose-Capillary: 156 mg/dL — ABNORMAL HIGH (ref 70–99)
Glucose-Capillary: 158 mg/dL — ABNORMAL HIGH (ref 70–99)
Glucose-Capillary: 163 mg/dL — ABNORMAL HIGH (ref 70–99)
Glucose-Capillary: 163 mg/dL — ABNORMAL HIGH (ref 70–99)
Glucose-Capillary: 164 mg/dL — ABNORMAL HIGH (ref 70–99)
Glucose-Capillary: 170 mg/dL — ABNORMAL HIGH (ref 70–99)

## 2024-01-05 NOTE — Plan of Care (Signed)

## 2024-01-05 NOTE — Progress Notes (Signed)
 PROGRESS NOTE    David [name]  FMW:[id number] DOB: 01/21/1946 DOA: 01/01/2024 PCP: Pcp, No   Brief Narrative: This 78 yrs old Male with medical history significant for combined CHF, reduced EF 30 to 35%, chronic microcytic anemia, Essential hypertension, CKD stage IV, CAD s/p cardiac stents, insulin -dependent DM type II, and chronic diabetic foot ulcers in bilateral lower extremities presented in the ED for the evaluation for decreased oral intake, low hemoglobin 7.2 and hypoglycemia with blood glucose 66. EMS gave glucagon with improvement of blood glucose to 80.  Patient states that skilled nursing facility/nursing home is not feeding him well and he is unable to feed himself.  Patient is stating that nursing home has not feed him or give any meal for last 5 days.  Patient and family is requesting different SNF facility for placement.  Patient found to have AKI on CKD stage IV.  He is continued on IV fluid resuscitation.  He appears at his baseline,  denies any concerns.   Assessment & Plan:   Principal Problem:   Acute kidney injury superimposed on chronic kidney disease (HCC) Active Problems:   Failure to thrive in adult   Essential hypertension   Insulin  dependent type 2 diabetes mellitus (HCC)   Peripheral neuropathy   Diabetic polyneuropathy associated with type 2 diabetes mellitus (HCC)   Nonhealing ulcer of left lower extremity limited to breakdown of skin (HCC)   Chronic anemia   Chronic combined systolic and diastolic CHF (congestive heart failure) (HCC)   Hypoglycemia   History of CAD (coronary artery disease)   History of gastrointestinal ulcer   Generalized anxiety disorder  AKI on CKD stage IV : Patient presented in the ED complaining of unable to feed himself at nursing home, states SNF has not given  him food almost for last 5 days.  EMS found patient was hypoglycemic,  blood glucose 66 which improved to 100 after giving glucagon.  Patient is hemodynamically stable.  Currently requiring oxygen 2 L O2 sat 100%.  Blood glucose 111.  CMP showing elevated creatinine 3.11 and low GFR 30.  Low calcium  8.3.  Low albumin  2.4. CBC showing hemoglobin 7.2 (baseline hemoglobin around 7-8), elevated WBC count 12 and normal platelet count. In the ED patient received 1 L of LR bolus. Continue IV fluid resuscitation.  Serum creatinine improving.  Avoid nephrotoxic medications.  Blood work was improved.  Patient is eating well in hospital.  Failure to thrive in adult: He reports he is unable to feed himself due to generalized weakness.   Concern for failure to thrive in adult.   Consulting inpatient PT and OT for evaluation and possible long-term assisted living facility placement.   Hypoglycemia-resolved: Insulin -dependent DM type II Initial blood glucose was 66 which improved to 100 after giving glucagon per EMS report.  Most recent A1c 6 in June 2025. Hold Lantus  in the setting of hypoglycemia.     Anemia of chronic disease: History of GI ulcer on most recent EGD 12/06/2023 Stable H&H 7.2 and 23.  Elevated MCV 115.  Baseline hemoglobin around 7-8.   History of chronic anemia in the setting of CKD and history of GI ulcer. Hb dropped to 6.9.  Transfuse 1 unit PRBC, follow-up H&H.  No obvious visible bleeding noted.   Peripheral neuropathy: Continue gabapentin .   Generalized anxiety disorder: Continue Zoloft .   Chronic constipation Diabetic gastroparesis Continue Reglan  5 mg 3 times daily.   Combined systolic and diastolic heart,  reduced EF less than 30  to 35% Blood pressure within good range.  Continue BiDil  3 times daily.   History of CAD: Continue aspirin  and Lipitor .   Bilateral lower extremities chronic diabetic ulcer Consulting wound care for dressing change and further management.   Sore throat and left-sided ear pain Continue ciprofloxacin  eardrop twice daily for 5 days.      DVT prophylaxis: SCDs Code Status: Full code Family  Communication: No family at bed side. Disposition Plan:    Status is: Inpatient Remains inpatient appropriate because:  Needs placement in different SNF facility.   Consultants:  None  Procedures: None  Antimicrobials:  Anti-infectives (From admission, onward)    None      Subjective: Patient was seen and examined at bedside. Overnight events noted. Patient reports feeling much improved.  His hemoglobin dropped requiring blood transfusion.   Patient denies any visible,  notable bleeding.   Objective: Vitals:   01/04/24 2010 01/05/24 0037 01/05/24 0428 01/05/24 0803  BP: 137/89 110/69 110/63 125/79  Pulse: 63 66 62 62  Resp: 16 16 16 18   Temp: 98 F (36.7 C) 98 F (36.7 C) 98.7 F (37.1 C) (!) 97.5 F (36.4 C)  TempSrc: Oral Oral  Oral  SpO2: 100% 100% 100% 100%  Weight:      Height:        Intake/Output Summary (Last 24 hours) at 01/05/2024 1106 Last data filed at 01/05/2024 0428 Gross per 24 hour  Intake 1478 ml  Output 500 ml  Net 978 ml   Filed Weights   01/01/24 1816  Weight: 85 kg    Examination:  General exam: Appears calm and comfortable, not in any acute distress, deconditioned. Respiratory system: CTA Bilaterally. Respiratory effort normal.  RR 15 Cardiovascular system: S1 & S2 heard, RRR. No JVD, murmurs, rubs, gallops or clicks.  Gastrointestinal system: Abdomen is non distended, soft and non tender.  Normal bowel sounds heard. Central nervous system: Alert and oriented x 3. No focal neurological deficits. Extremities: No edema, no cyanosis, no clubbing. Skin: No rashes, lesions or ulcers Psychiatry: Judgement and insight appear normal. Mood & affect appropriate.     Data Reviewed: I have personally reviewed following labs and imaging studies  CBC: Recent Labs  Lab 01/01/24 2015 01/02/24 1130 01/04/24 0250 01/04/24 1548  WBC 12.8* 8.2 7.1  --   NEUTROABS 10.6*  --   --   --   HGB 7.2* 6.9* 6.8* 9.7*  HCT 23.6* 22.0* 22.0* 31.4*   MCV 115.7* 114.6* 108.9*  --   PLT 232 229 193  --    Basic Metabolic Panel: Recent Labs  Lab 01/01/24 1947 01/02/24 0110 01/04/24 0250  NA 139 138 139  K 4.8 4.4 4.6  CL 108 107 110  CO2 22 23 24   GLUCOSE 110* 143* 158*  BUN 51* 50* 36*  CREATININE 3.11* 2.99* 2.29*  CALCIUM  8.3* 8.1* 7.9*  MG  --   --  2.0  PHOS  --   --  3.1   GFR: Estimated Creatinine Clearance: 31.9 mL/min (A) (by C-G formula based on SCr of 2.29 mg/dL (H)). Liver Function Tests: Recent Labs  Lab 01/01/24 1947 01/02/24 0110  AST 30 39  ALT 30 33  ALKPHOS 97 105  BILITOT 0.5 0.8  PROT 7.5 7.8  ALBUMIN  2.4* 2.6*   No results for input(s): LIPASE, AMYLASE in the last 168 hours. No results for input(s): AMMONIA in the last 168 hours. Coagulation Profile: No results for input(s): INR, PROTIME in the  last 168 hours. Cardiac Enzymes: No results for input(s): CKTOTAL, CKMB, CKMBINDEX, TROPONINI in the last 168 hours. BNP (last 3 results) No results for input(s): PROBNP in the last 8760 hours. HbA1C: No results for input(s): HGBA1C in the last 72 hours. CBG: Recent Labs  Lab 01/04/24 1646 01/04/24 2012 01/05/24 0038 01/05/24 0426 01/05/24 0806  GLUCAP 122* 138* 163* 170* 156*   Lipid Profile: No results for input(s): CHOL, HDL, LDLCALC, TRIG, CHOLHDL, LDLDIRECT in the last 72 hours. Thyroid  Function Tests: No results for input(s): TSH, T4TOTAL, FREET4, T3FREE, THYROIDAB in the last 72 hours. Anemia Panel: No results for input(s): VITAMINB12, FOLATE, FERRITIN, TIBC, IRON , RETICCTPCT in the last 72 hours. Sepsis Labs: No results for input(s): PROCALCITON, LATICACIDVEN in the last 168 hours.  No results found for this or any previous visit (from the past 240 hours).   Radiology Studies: No results found.  Scheduled Meds:  aspirin  EC  81 mg Oral Daily   atorvastatin   80 mg Oral QHS   ciprofloxacin -fluocinolone  PF  0.25 mL Left  EAR BID   cyanocobalamin   2,000 mcg Oral Daily   dicyclomine   10 mg Oral TID AC & HS   docusate sodium   100 mg Oral BID   gabapentin   200 mg Oral TID   isosorbide -hydrALAZINE   2 tablet Oral TID   latanoprost   1 drop Both Eyes QHS   lubiprostone   24 mcg Oral Q breakfast   metoCLOPramide   5 mg Oral TID   pantoprazole   40 mg Oral Daily   polyethylene glycol  17 g Oral Daily   sertraline   25 mg Oral Daily   sodium chloride  flush  3 mL Intravenous Q12H   sodium chloride  flush  3 mL Intravenous Q12H   traZODone   100 mg Oral QHS   Continuous Infusions:   LOS: 3 days    Time spent: 35 mins    Darcel Dawley, MD [facility]   If 7PM-7AM, please contact night-coverage

## 2024-01-06 DIAGNOSIS — N179 Acute kidney failure, unspecified: Secondary | ICD-10-CM | POA: Diagnosis not present

## 2024-01-06 DIAGNOSIS — N189 Chronic kidney disease, unspecified: Secondary | ICD-10-CM | POA: Diagnosis not present

## 2024-01-06 LAB — CBC WITH DIFFERENTIAL/PLATELET
Basophils Absolute: 0 K/uL (ref 0.0–0.1)
Basophils Relative: 0 %
Eosinophils Absolute: 0.1 K/uL (ref 0.0–0.5)
Eosinophils Relative: 2 %
HCT: 31.2 % — ABNORMAL LOW (ref 39.0–52.0)
Hemoglobin: 9.6 g/dL — ABNORMAL LOW (ref 13.0–17.0)
Lymphocytes Relative: 15 %
Lymphs Abs: 1 K/uL (ref 0.7–4.0)
MCH: 32.5 pg (ref 26.0–34.0)
MCHC: 30.8 g/dL (ref 30.0–36.0)
MCV: 105.8 fL — ABNORMAL HIGH (ref 80.0–100.0)
Monocytes Absolute: 0.1 K/uL (ref 0.1–1.0)
Monocytes Relative: 2 %
Neutro Abs: 5.4 K/uL (ref 1.7–7.7)
Neutrophils Relative %: 81 %
Platelets: 194 K/uL (ref 150–400)
RBC: 2.95 MIL/uL — ABNORMAL LOW (ref 4.22–5.81)
RDW: 24.2 % — ABNORMAL HIGH (ref 11.5–15.5)
WBC: 6.7 K/uL (ref 4.0–10.5)
nRBC: 0 % (ref 0.0–0.2)

## 2024-01-06 LAB — BASIC METABOLIC PANEL WITH GFR
Anion gap: 6 (ref 5–15)
BUN: 43 mg/dL — ABNORMAL HIGH (ref 8–23)
CO2: 22 mmol/L (ref 22–32)
Calcium: 8.1 mg/dL — ABNORMAL LOW (ref 8.9–10.3)
Chloride: 106 mmol/L (ref 98–111)
Creatinine, Ser: 3.06 mg/dL — ABNORMAL HIGH (ref 0.61–1.24)
GFR, Estimated: 20 mL/min — ABNORMAL LOW (ref >60)
Glucose, Bld: 158 mg/dL — ABNORMAL HIGH (ref 70–99)
Potassium: 5.2 mmol/L — ABNORMAL HIGH (ref 3.5–5.1)
Sodium: 134 mmol/L — ABNORMAL LOW (ref 135–145)

## 2024-01-06 LAB — GLUCOSE, CAPILLARY
Glucose-Capillary: 153 mg/dL — ABNORMAL HIGH (ref 70–99)
Glucose-Capillary: 163 mg/dL — ABNORMAL HIGH (ref 70–99)
Glucose-Capillary: 119 mg/dL — ABNORMAL HIGH (ref 70–99)
Glucose-Capillary: 153 mg/dL — ABNORMAL HIGH (ref 70–99)

## 2024-01-06 LAB — MAGNESIUM: Magnesium: 2 mg/dL (ref 1.7–2.4)

## 2024-01-06 LAB — PHOSPHORUS: Phosphorus: 4.5 mg/dL (ref 2.5–4.6)

## 2024-01-06 MED ORDER — INSULIN ASPART 100 UNIT/ML IJ SOLN: 2.0000 [IU] | Freq: Two times a day (BID) | INTRAMUSCULAR | Status: DC

## 2024-01-06 MED ORDER — LANTUS SOLOSTAR 100 UNIT/ML ~~LOC~~ SOPN: 8.0000 [IU] | PEN_INJECTOR | Freq: Every day | SUBCUTANEOUS | Status: DC

## 2024-01-06 NOTE — Progress Notes (Signed)
 Physical Therapy Treatment Patient Details Name: David [name] MRN: [id number] DOB: 02-26-1946 Today's Date: 01/06/2024   History of Present Illness 78 y.o. male presenting 8/15 from [place] where he was reported to be lethargic with poor PO intake and labs showing HGB of 6.2. Found to have AKI, FTT, hypoglycemia. PMH significant of CAD, combined CHF, HTN, L fifth metatarsal amputation, chronic BLE ulcers, DMII, diabetic neuropathy, diabetic retinopathy, glaucoma, and CKD    PT Comments  Pt asleep on entry, needing increased stimulation to rouse. NT in room collecting vitals and agreeable to assist in bed mobility. Pt needs total A for moving LE out of Prevalon boots. Pt maxAx2 to come to EoB. Once there he was able to progress from maxA for support to ~30 sec of static sitting unsupported before increased fatigue and need for posterior support. Pt ultimately requires total Ax2 for return to supine. Pt was able to lift B LE for placement of Prevalon boots at end of session. Patient will benefit from continued inpatient follow up therapy, <3 hours/day. PT will continue to follow acutely.      If plan is discharge home, recommend the following: Two people to help with walking and/or transfers;Two people to help with bathing/dressing/bathroom   Can travel by private vehicle     No  Equipment Recommendations  None recommended by PT    Recommendations for Other Services       Precautions / Restrictions Precautions Precautions: Fall Recall of Precautions/Restrictions: Intact Restrictions Weight Bearing Restrictions Per Provider Order: No     Mobility  Bed Mobility Overal bed mobility: Needs Assistance Bed Mobility: Supine to Sit, Sit to Supine Rolling: Max assist, Used rails, +2 for physical assistance   Supine to sit: Max assist, +2 for physical assistance, Used rails, HOB elevated Sit to supine: Total assist, +2 for physical assistance   General bed mobility comments: pt  requiring increased cuing for command follow, ultimately maxAx2 for rolling and coming to EoB with heavy use of rails, pt requires total Ax 2 for bringing LE back into the bed.    Transfers                   General transfer comment: deferred due to level of arousal    Ambulation/Gait               General Gait Details: non-ambulatory         Balance Overall balance assessment: Needs assistance Sitting-balance support: Bilateral upper extremity supported, Feet supported Sitting balance-Leahy Scale: Poor Sitting balance - Comments: supervision -max A for static seated balance. able to achieve balance and maintain for ~30 sec before needing posterior support                                    Communication Communication Communication: No apparent difficulties;Impaired Factors Affecting Communication: Other (comment);Reduced clarity of speech  Cognition Arousal: Alert Behavior During Therapy: WFL for tasks assessed/performed                           PT - Cognition Comments: pt asleep on entry, takes a little time to rouse Following commands: Intact      Cueing Cueing Techniques: Verbal cues, Tactile cues     General Comments General comments (skin integrity, edema, etc.): 2L O2 via [place], VSS      Pertinent Vitals/Pain Pain Assessment  Pain Assessment: Faces Faces Pain Scale: Hurts a little bit Pain Location: generalized with movement Pain Descriptors / Indicators: Discomfort Pain Intervention(s): Limited activity within patient's tolerance, Monitored during session, Repositioned     PT Goals (current goals can now be found in the care plan section) Acute Rehab PT Goals Patient Stated Goal: get stronger PT Goal Formulation: With patient/family Time For Goal Achievement: 01/16/24 Potential to Achieve Goals: Good Progress towards PT goals: Progressing toward goals    Frequency    Min 2X/week       AM-PAC PT 6 Clicks  Mobility   Outcome Measure  Help needed turning from your back to your side while in a flat bed without using bedrails?: Total Help needed moving from lying on your back to sitting on the side of a flat bed without using bedrails?: Total Help needed moving to and from a bed to a chair (including a wheelchair)?: Total Help needed standing up from a chair using your arms (e.g., wheelchair or bedside chair)?: Total Help needed to walk in hospital room?: Total Help needed climbing 3-5 steps with a railing? : Total 6 Click Score: 6    End of Session   Activity Tolerance: Patient limited by fatigue Patient left: in bed;with call bell/phone within reach;with family/visitor present;with nursing/sitter in room Nurse Communication: Mobility status PT Visit Diagnosis: Unsteadiness on feet (R26.81);Difficulty in walking, not elsewhere classified (R26.2)     Time: 8844-8783 PT Time Calculation (min) (ACUTE ONLY): 21 min  Charges:    $Therapeutic Activity: 8-22 mins PT General Charges $$ ACUTE PT VISIT: 1 Visit                     Lourdes Kucharski B. Fleeta Lapidus PT, DPT Acute Rehabilitation Services Please use secure chat or  Call Office [phone]    David [name] Fleeta [facility] 01/06/2024, 12:23 PM

## 2024-01-06 NOTE — Plan of Care (Signed)

## 2024-01-06 NOTE — NC FL2 (Signed)
 [place]  MEDICAID FL2 LEVEL OF CARE FORM     IDENTIFICATION  Patient Name: David [name] Birthdate: [birth year] Sex: male Admission Date (Current Location): 01/01/2024  [facility] and [place] Number:  Producer, television/film/video and Address:  The . [facility], [address] [place], [place] [address]      Provider Number: [id number]  Attending Physician Name and Address:  Cheryle Page, MD  Relative Name and Phone Number:  [name] Collum (spouse) [phone]    Current Level of Care: Hospital Recommended Level of Care: Skilled Nursing Facility Prior Approval Number:    Date Approved/Denied:   PASRR Number: [id number]  Discharge Plan: SNF    Current Diagnoses: Patient Active Problem List   Diagnosis Date Noted   Failure to thrive in adult 01/02/2024   Hypoglycemia 01/02/2024   History of CAD (coronary artery disease) 01/02/2024   History of gastrointestinal ulcer 01/02/2024   Generalized anxiety disorder 01/02/2024   Troponin level elevated 11/18/2023   Chronic anemia 09/04/2023   Chronic combined systolic and diastolic CHF (congestive heart failure) (HCC) 09/04/2023   Acute on chronic combined systolic and diastolic CHF (congestive heart failure) (HCC) 05/12/2023   Malnutrition of moderate degree 05/06/2023   Diabetic foot infection (HCC) 05/05/2023   Sacral wound 05/05/2023   Pleural effusion on right 05/05/2023   Elevated TSH 05/05/2023   Acute on chronic diastolic (congestive) heart failure (HCC) 05/05/2023   Diastolic CHF (HCC) 05/04/2023   Class 1 obesity 06/30/2022   Acute on chronic diastolic CHF (congestive heart failure) (HCC) 06/29/2022   PAD (peripheral artery disease) (HCC) 06/13/2022   Critical limb ischemia of left lower extremity (HCC) 03/21/2021   Nonhealing ulcer of left lower extremity limited to breakdown of skin (HCC) 03/21/2021   Abscess of left foot 08/03/2020   Osteomyelitis of fifth toe of left foot (HCC)    Mixed  hyperlipidemia 10/15/2018   Bilateral leg edema 09/17/2018   Stage 3b chronic kidney disease (HCC) 09/10/2018   Anemia 09/10/2018   Pain and swelling of wrist, right 08/14/2018   Acute kidney injury superimposed on chronic kidney disease (HCC) 08/06/2018   Bilateral carotid artery stenosis 07/30/2018   Coronary artery disease involving native coronary artery of native heart without angina pectoris 07/30/2018   Snoring 02/11/2018   Chronic heart failure with preserved ejection fraction (HFpEF) (HCC) 12/22/2017   At risk for adverse drug reaction 12/15/2017   Chronic pain syndrome 12/15/2017   Status post coronary artery stent placement    Pneumonia due to infectious organism 03/30/2017   Insulin  dependent type 2 diabetes mellitus (HCC) 03/29/2017   Acute respiratory failure with hypoxemia (HCC) 03/29/2017   Acute on chronic respiratory failure with hypoxia (HCC)    Pulmonary congestion    Acquired contracture of Achilles tendon, right 08/19/2016   Amputated great toe, right (HCC) 07/18/2016   Onychomycosis 05/29/2016   Diabetic polyneuropathy associated with type 2 diabetes mellitus (HCC) 04/09/2016   Right foot ulcer, limited to breakdown of skin (HCC) 04/09/2016   Spinal stenosis of lumbar region without neurogenic claudication 05/24/2014   Peripheral neuropathy 09/15/2013   Malaise 08/14/2013   Essential hypertension 08/14/2013   Chronic back pain 08/14/2013   Glaucoma 08/14/2013   Headache 08/14/2013    Orientation RESPIRATION BLADDER Height & Weight     Self, Place, Situation  O2 (2L) Continent, External catheter Weight: 187 lb 6.3 oz (85 kg) Height:  6' 2 (188 cm)  BEHAVIORAL SYMPTOMS/MOOD NEUROLOGICAL BOWEL NUTRITION STATUS  Continent Diet (see DC summary)  AMBULATORY STATUS COMMUNICATION OF NEEDS Skin   Extensive Assist Verbally Normal                       Personal Care Assistance Level of Assistance  Bathing, Feeding, Dressing Bathing Assistance:  Maximum assistance Feeding assistance: Limited assistance Dressing Assistance: Maximum assistance     Functional Limitations Info  Sight, Hearing, Speech Sight Info: Impaired Hearing Info: Adequate Speech Info: Adequate    SPECIAL CARE FACTORS FREQUENCY  PT (By licensed PT), OT (By licensed OT)     PT Frequency: 5x/week OT Frequency: 5x/week            Contractures Contractures Info: Not present    Additional Factors Info  Code Status, Allergies Code Status Info: FULL Allergies Info: Nsaids Psychotropic Info: gabapentin  (NEURONTIN ) capsule 200 mg 3 times daily,sertraline  (ZOLOFT ) tablet 25 mg daily,traZODone  (DESYREL ) tablet 100 mg daily at bedtime         Current Medications (01/06/2024):  This is the current hospital active medication list Current Facility-Administered Medications  Medication Dose Route Frequency Provider Last Rate Last Admin   acetaminophen  (TYLENOL ) tablet 650 mg  650 mg Oral Q6H PRN Sundil, Subrina, MD   650 mg at 01/03/24 1344   Or   acetaminophen  (TYLENOL ) suppository 650 mg  650 mg Rectal Q6H PRN Sundil, Subrina, MD       albuterol  (PROVENTIL ) (2.5 MG/3ML) 0.083% nebulizer solution 3 mL  3 mL Inhalation Q6H PRN Sundil, Subrina, MD       aspirin  EC tablet 81 mg  81 mg Oral Daily Sundil, Subrina, MD   81 mg at 01/06/24 0756   atorvastatin  (LIPITOR ) tablet 80 mg  80 mg Oral QHS Sundil, Subrina, MD   80 mg at 01/05/24 2141   ciprofloxacin -fluocinolone  OTIC (EAR) solution 0.3%-0.025%  0.25 mL Left EAR BID Sundil, Subrina, MD   0.25 mL at 01/06/24 0757   cyanocobalamin  (VITAMIN B12) tablet 2,000 mcg  2,000 mcg Oral Daily Sundil, Subrina, MD   2,000 mcg at 01/06/24 0755   dicyclomine  (BENTYL ) capsule 10 mg  10 mg Oral TID AC & HS Sundil, Subrina, MD   10 mg at 01/06/24 1101   docusate sodium  (COLACE) capsule 100 mg  100 mg Oral BID Sundil, Subrina, MD   100 mg at 01/06/24 9243   gabapentin  (NEURONTIN ) capsule 200 mg  200 mg Oral TID Sundil, Subrina, MD    200 mg at 01/06/24 9245   isosorbide -hydrALAZINE  (BIDIL ) 20-37.5 MG per tablet 2 tablet  2 tablet Oral TID Sundil, Subrina, MD   2 tablet at 01/06/24 0755   latanoprost  (XALATAN ) 0.005 % ophthalmic solution 1 drop  1 drop Both Eyes QHS Sundil, Subrina, MD   1 drop at 01/05/24 2142   lubiprostone  (AMITIZA ) capsule 24 mcg  24 mcg Oral Q breakfast Sundil, Subrina, MD   24 mcg at 01/06/24 9244   menthol -cetylpyridinium (CEPACOL) lozenge 3 mg  1 lozenge Oral PRN Sundil, Subrina, MD       methocarbamol  (ROBAXIN ) tablet 500 mg  500 mg Oral Q6H PRN Sundil, Subrina, MD   500 mg at 01/04/24 0850   metoCLOPramide  (REGLAN ) tablet 5 mg  5 mg Oral TID Sundil, Subrina, MD   5 mg at 01/06/24 9244   ondansetron  (ZOFRAN ) tablet 4 mg  4 mg Oral Q6H PRN Sundil, Subrina, MD       Or   ondansetron  (ZOFRAN ) injection 4 mg  4 mg Intravenous  Q6H PRN Sundil, Subrina, MD       pantoprazole  (PROTONIX ) EC tablet 40 mg  40 mg Oral Daily Sundil, Subrina, MD   40 mg at 01/06/24 0755   phenol (CHLORASEPTIC) mouth spray 1 spray  1 spray Mouth/Throat PRN Sundil, Subrina, MD   1 spray at 01/02/24 0447   polyethylene glycol (MIRALAX  / GLYCOLAX ) packet 17 g  17 g Oral Daily Sundil, Subrina, MD   17 g at 01/06/24 0757   sertraline  (ZOLOFT ) tablet 25 mg  25 mg Oral Daily Sundil, Subrina, MD   25 mg at 01/06/24 0755   sodium chloride  flush (NS) 0.9 % injection 3 mL  3 mL Intravenous Q12H Sundil, Subrina, MD   3 mL at 01/06/24 9081   sodium chloride  flush (NS) 0.9 % injection 3 mL  3 mL Intravenous Q12H Sundil, Subrina, MD   3 mL at 01/06/24 9081   sodium chloride  flush (NS) 0.9 % injection 3 mL  3 mL Intravenous PRN Sundil, Subrina, MD       traMADol  (ULTRAM ) tablet 50 mg  50 mg Oral Q12H PRN Opyd, Timothy S, MD   50 mg at 01/06/24 0756   traZODone  (DESYREL ) tablet 100 mg  100 mg Oral QHS Sundil, Subrina, MD   100 mg at 01/05/24 2141     Discharge Medications: Please see discharge summary for a list of discharge  medications.  Relevant Imaging Results:  Relevant Lab Results:   Additional Information SSN: [id number]  Nazar Kuan A [place], LCSW

## 2024-01-06 NOTE — TOC Progression Note (Addendum)
 Transition of Care [facility]) - Progression Note    Patient Details  Name: David [name] MRN: [id number] Date of Birth: 06-04-1945  Transition of Care [facility] CM/SW Contact  Pinkey Mcjunkin A [place], LCSW Phone Number: 01/06/2024, 1:02 PM  Clinical Narrative:      Update 1605 Authorization unable to be completed. Pt's information for current subscriber # on facesheet  is dated, policy ended on 12/17/23. CSW contacted pt and pt spouse, given updated # from card they had, [facility] end date still 12/17/23.   CSW reached out to admitting to find out if they had information on pt's updated information. No answer, unable to leave VM.   CSW reached out to [facility] stated pt needs new authorization under their Medicare benefits, unable to use pt's Medicaid. If unable, CSW will follow up regarding.     1302 CSW notified pt's medical stable for DC.   CSW met with pt and pt's wife [name] at bedside. Pt said he was ok to go back to [facility], but asked if he could return to his similar room. CSW requested on pt's behalf to Tok at [facility]. [facility] notified of possible DC today and waiting to hear back regarding bed availability.  Faxed out other bed offers, unlikely for pt to DC to another facility.   CSW requested CMA to assist with insurance authorization approval for short term rehab at [facility]. Updated PT note available.      CSW will continue to follow.   Expected Discharge Plan: Skilled Nursing Facility Barriers to Discharge: Continued Medical Work up               Expected Discharge Plan and Services In-house Referral: Clinical Social Work     Living arrangements for the past 2 months: Skilled Holiday representative (from [place])                                       Social Drivers of Health (SDOH) Interventions SDOH Screenings   Food Insecurity: No Food Insecurity (01/02/2024)  Housing: Low Risk  (01/02/2024)  Transportation Needs: No  Transportation Needs (01/02/2024)  Utilities: Not At Risk (01/02/2024)  Alcohol Screen: Low Risk  (05/18/2023)  Financial Resource Strain: Low Risk  (05/18/2023)  Physical Activity: Inactive (08/20/2017)  Social Connections: Socially Integrated (01/02/2024)  Stress: No Stress Concern Present (08/20/2017)  Tobacco Use: Medium Risk (01/02/2024)    Readmission Risk Interventions    11/09/2023   11:30 AM 11/06/2023    8:44 AM  Readmission Risk Prevention Plan  Transportation Screening Complete Complete  Medication Review (RN Care Manager) Complete Complete  PCP or Specialist appointment within 3-5 days of discharge Complete   HRI or Home Care Consult Complete Complete  SW Recovery Care/Counseling Consult Complete Complete  Palliative Care Screening Not Applicable Not Applicable  Skilled Nursing Facility Complete Not Applicable

## 2024-01-06 NOTE — Progress Notes (Signed)
 PROGRESS NOTE    David [name]  FMW:[id number] DOB: 08/08/1945 DOA: 01/01/2024 PCP: Pcp, No   Brief Narrative:  78 yrs old Male with medical history significant for combined CHF, reduced EF 30 to 35%, chronic microcytic anemia, Essential hypertension, CKD stage IV, CAD s/p cardiac stents, insulin -dependent DM type II, and chronic diabetic foot ulcers in bilateral lower extremities presented for the evaluation of decreased oral intake, low hemoglobin 7.2 and hypoglycemia with blood glucose 66. EMS gave glucagon with improvement of blood glucose to 80.  On presentation, he was found to have AKI on CKD stage IV.  He was started on IV fluids.  PT recommending SNF placement.  TOC consulted.  Subsequently, his creatinine has improved.  Currently medically for discharge.  Assessment & Plan:   AKI on CKD stage IV -Possibly from poor oral intake - Treated with IV fluids and discontinued.  Improving.  Creatinine currently at baseline.  Monitor.  Outpatient follow-up with nephrology.  Doubt that patient is a candidate for hemodialysis.  Failure to thrive in adult - Patient apparently unable to feed himself due to generalized weakness.  Questionable cause. - Encourage oral intake. -PT recommending SNF.  TOC consulted  Hypoglycemia: Resolved Insulin -dependent diabetes mellitus type 2 - Most recent A1c was 6 in June 2005.  Blood sugars currently stable.  Long-acting insulin  on hold for now  Anemia of chronic disease: History of GI ulcer on most recent EGD 12/06/2023 - Hemoglobin 6.8 on 01/04/2024: Received 1 unit of packed red cell transfusion.  Hemoglobin 9.6 today.  No signs of bleeding.  Continue Protonix   peripheral neuropathy: -Continue gabapentin .   Generalized anxiety disorder: -Continue Zoloft .   Chronic constipation Diabetic gastroparesis -Continue Reglan .   Combined systolic and diastolic heart,  reduced EF less than 30 to 35% -Blood pressure within good range.  Continue BiDil .    History of CAD: Continue aspirin  and Lipitor .   Bilateral lower extremities chronic diabetic ulcer and stage III pressure injury of sacrum present on admission -Follow wound care consult recommendations   Sore throat and left-sided ear pain -Continue ciprofloxacin  eardrop twice daily for 5 days.  Goals of care - Consult palliative care for goals of care discussion    DVT prophylaxis: SCDs Code Status: Full Family Communication: Wife at bedside Disposition Plan: Status is: Inpatient Remains inpatient appropriate because: Of severity of illness    Consultants: Consult palliative care  Procedures: None  Antimicrobials:  Anti-infectives (From admission, onward)    None        Subjective: Patient seen and examined at bedside.  Poor historian.  No fever, seizures, agitation reported.  Objective: Vitals:   01/05/24 2059 01/05/24 2344 01/06/24 0446 01/06/24 0742  BP: 123/70 (!) 140/77 120/72 (!) 138/91  Pulse: 65 65 66 65  Resp: 18  18 18   Temp: 97.8 F (36.6 C) 98.4 F (36.9 C) 97.8 F (36.6 C) 98.1 F (36.7 C)  TempSrc:    Oral  SpO2: 100% 100% 100% 100%  Weight:      Height:        Intake/Output Summary (Last 24 hours) at 01/06/2024 1157 Last data filed at 01/05/2024 2154 Gross per 24 hour  Intake 240 ml  Output 200 ml  Net 40 ml   Filed Weights   01/01/24 1816  Weight: 85 kg    Examination:  General exam: Appears calm and comfortable.  Chronically ill and deconditioned looking. Respiratory system: Bilateral decreased breath sounds at bases Cardiovascular system: S1 & S2 heard,  Rate controlled  Gastrointestinal system: Abdomen is slightly distended, soft and nontender. bowel sounds heard. Extremities: Bilateral lower extremity edema; no cyanosis Central nervous system: Alert and oriented.  Slow to respond.  Poor historian.  No focal neurological deficits. Moving extremities Skin: No petechiae/lesions  psychiatry: Flat affect.  Not  agitated.    Data Reviewed: I have personally reviewed following labs and imaging studies  CBC: Recent Labs  Lab 01/01/24 2015 01/02/24 1130 01/04/24 0250 01/04/24 1548 01/06/24 0936  WBC 12.8* 8.2 7.1  --  6.7  NEUTROABS 10.6*  --   --   --  5.4  HGB 7.2* 6.9* 6.8* 9.7* 9.6*  HCT 23.6* 22.0* 22.0* 31.4* 31.2*  MCV 115.7* 114.6* 108.9*  --  105.8*  PLT 232 229 193  --  194   Basic Metabolic Panel: Recent Labs  Lab 01/01/24 1947 01/02/24 0110 01/04/24 0250 01/06/24 0249  NA 139 138 139 134*  K 4.8 4.4 4.6 5.2*  CL 108 107 110 106  CO2 22 23 24 22   GLUCOSE 110* 143* 158* 158*  BUN 51* 50* 36* 43*  CREATININE 3.11* 2.99* 2.29* 3.06*  CALCIUM  8.3* 8.1* 7.9* 8.1*  MG  --   --  2.0 2.0  PHOS  --   --  3.1 4.5   GFR: Estimated Creatinine Clearance: 23.9 mL/min (A) (by C-G formula based on SCr of 3.06 mg/dL (H)). Liver Function Tests: Recent Labs  Lab 01/01/24 1947 01/02/24 0110  AST 30 39  ALT 30 33  ALKPHOS 97 105  BILITOT 0.5 0.8  PROT 7.5 7.8  ALBUMIN  2.4* 2.6*   No results for input(s): LIPASE, AMYLASE in the last 168 hours. No results for input(s): AMMONIA in the last 168 hours. Coagulation Profile: No results for input(s): INR, PROTIME in the last 168 hours. Cardiac Enzymes: No results for input(s): CKTOTAL, CKMB, CKMBINDEX, TROPONINI in the last 168 hours. BNP (last 3 results) No results for input(s): PROBNP in the last 8760 hours. HbA1C: No results for input(s): HGBA1C in the last 72 hours. CBG: Recent Labs  Lab 01/05/24 1928 01/05/24 2117 01/05/24 2345 01/06/24 0447 01/06/24 0744  GLUCAP 121* 158* 163* 153* 163*   Lipid Profile: No results for input(s): CHOL, HDL, LDLCALC, TRIG, CHOLHDL, LDLDIRECT in the last 72 hours. Thyroid  Function Tests: No results for input(s): TSH, T4TOTAL, FREET4, T3FREE, THYROIDAB in the last 72 hours. Anemia Panel: No results for input(s): VITAMINB12, FOLATE,  FERRITIN, TIBC, IRON , RETICCTPCT in the last 72 hours. Sepsis Labs: No results for input(s): PROCALCITON, LATICACIDVEN in the last 168 hours.  No results found for this or any previous visit (from the past 240 hours).       Radiology Studies: No results found.      Scheduled Meds:  aspirin  EC  81 mg Oral Daily   atorvastatin   80 mg Oral QHS   ciprofloxacin -fluocinolone  PF  0.25 mL Left EAR BID   cyanocobalamin   2,000 mcg Oral Daily   dicyclomine   10 mg Oral TID AC & HS   docusate sodium   100 mg Oral BID   gabapentin   200 mg Oral TID   isosorbide -hydrALAZINE   2 tablet Oral TID   latanoprost   1 drop Both Eyes QHS   lubiprostone   24 mcg Oral Q breakfast   metoCLOPramide   5 mg Oral TID   pantoprazole   40 mg Oral Daily   polyethylene glycol  17 g Oral Daily   sertraline   25 mg Oral Daily   sodium chloride  flush  3 mL Intravenous Q12H   sodium chloride  flush  3 mL Intravenous Q12H   traZODone   100 mg Oral QHS   Continuous Infusions:        Sophie Mao, MD [facility] 01/06/2024, 11:57 AM

## 2024-01-07 DIAGNOSIS — N189 Chronic kidney disease, unspecified: Secondary | ICD-10-CM | POA: Diagnosis not present

## 2024-01-07 DIAGNOSIS — N179 Acute kidney failure, unspecified: Secondary | ICD-10-CM | POA: Diagnosis not present

## 2024-01-07 LAB — GLUCOSE, CAPILLARY
Glucose-Capillary: 136 mg/dL — ABNORMAL HIGH (ref 70–99)
Glucose-Capillary: 141 mg/dL — ABNORMAL HIGH (ref 70–99)
Glucose-Capillary: 148 mg/dL — ABNORMAL HIGH (ref 70–99)
Glucose-Capillary: 156 mg/dL — ABNORMAL HIGH (ref 70–99)
Glucose-Capillary: 214 mg/dL — ABNORMAL HIGH (ref 70–99)

## 2024-01-07 MED ORDER — TRAMADOL HCL 50 MG PO TABS: 50.0000 mg | ORAL_TABLET | Freq: Two times a day (BID) | ORAL | 0 refills | Status: DC | PRN

## 2024-01-07 MED ORDER — ACETAMINOPHEN 500 MG PO TABS
1000.0000 mg | ORAL_TABLET | Freq: Once | ORAL | Status: AC
  Administered 2024-01-07: 1000 mg via ORAL
  Filled 2024-01-07: qty 2

## 2024-01-07 NOTE — Discharge Summary (Signed)
 Physician Discharge Summary  David [name] FMW:[id number] DOB: 10-Dec-1945 DOA: 01/01/2024  PCP: Freddrick, No  Admit date: 01/01/2024 Discharge date: 01/07/2024  Admitted From: SNF Disposition: SNF  Recommendations for Outpatient Follow-up:  Follow up with SNF provider with repeat CBC/BMP in the next few days Outpatient follow-up with nephrology  Recommend outpatient evaluation and follow-up by palliative care for goals of care discussion  follow up in ED if symptoms worsen or new appear   Home Health: No Equipment/Devices: None  Discharge Condition: Guarded  CODE STATUS: Full Diet recommendation: Heart healthy  Brief/Interim Summary: 78 yrs old Male with medical history significant for combined CHF, reduced EF 30 to 35%, chronic microcytic anemia, Essential hypertension, CKD stage IV, CAD s/p cardiac stents, insulin -dependent DM type II, and chronic diabetic foot ulcers in bilateral lower extremities presented for the evaluation of decreased oral intake, low hemoglobin 7.2 and hypoglycemia with blood glucose 66. EMS gave glucagon with improvement of blood glucose to 80.  On presentation, he was found to have AKI on CKD stage IV.  He was started on IV fluids.  PT recommending SNF placement.  TOC consulted.  Subsequently, his creatinine has improved.  Currently medically stable for discharge.  Discharge to SNF once bed is available  Discharge Diagnoses:   AKI on CKD stage IV -Possibly from poor oral intake - Treated with IV fluids and discontinued.  Improving.  Creatinine currently at baseline.  Monitor intermittently as an outpatient.  Outpatient follow-up with nephrology.  Doubt that patient is a candidate for hemodialysis. -Discharge to SNF once bed is available   Failure to thrive in adult - Patient apparently unable to feed himself due to generalized weakness.  Questionable cause. - Encourage oral intake. -PT recommending SNF.     Hypoglycemia: Resolved Insulin -dependent  diabetes mellitus type 2 - Most recent A1c was 6 in June 2005.  Blood sugars currently stable.  Long-acting insulin  on hold for now   Anemia of chronic disease: History of GI ulcer on most recent EGD 12/06/2023 - Hemoglobin 6.8 on 01/04/2024: Received 1 unit of packed red cell transfusion.  Hemoglobin 9.6 today.  No signs of bleeding.  Continue Protonix    peripheral neuropathy: -Continue gabapentin .   Generalized anxiety disorder: -Continue Zoloft .   Chronic constipation Diabetic gastroparesis -Continue Reglan .   Combined systolic and diastolic heart,  reduced EF less than 30 to 35% -Blood pressure within good range.  Continue BiDil .   History of CAD: Continue aspirin  and Lipitor .   Bilateral lower extremities chronic diabetic ulcer and stage III pressure injury of sacrum present on admission -Follow wound care consult recommendations   Sore throat and left-sided ear pain - Completed 5 days of ciprofloxacin  eardrops   goals of care - Consult palliative care for goals of care discussion.  This can happen as an outpatient.    Discharge Instructions  Discharge Instructions     Diet - low sodium heart healthy   Complete by: As directed    Diet Carb Modified   Complete by: As directed    Discharge wound care:   Complete by: As directed    As per wound care RN's recommendations Wound care  2 times daily      Comments: Paint L great toe, L lateral foot and R plantar foot wounds with Betadine 2 times daily and secure with silicone foam or dry gauze and Kerlix roll guaze    Wound care  Daily      Comments: 1.  Cleanse sacral wound  with NS, apply Xeroform gauze David [name]) to wound bed daily and secure with silicone foam.  Apply silicone foam to healing wounds B buttocks, lift daily to assess 2. Paint heel wounds with Betadine then cover with Xeroform gauze (David [name]) daily.  Cover with silicone foam or Kerlix roll guaze and place B feet in Prevalon boots to offload pressure    Increase activity slowly   Complete by: As directed       Allergies as of 01/07/2024       Reactions   Nsaids Other (See Comments)   CKD stage 3         Medication List     STOP taking these medications    amLODipine  10 MG tablet Commonly known as: NORVASC    carbamide peroxide 6.5 % OTIC solution Commonly known as: DEBROX   hydrALAZINE  10 MG tablet Commonly known as: APRESOLINE    Lantus  SoloStar 100 UNIT/ML Solostar Pen Generic drug: insulin  glargine   leptospermum manuka honey Pste paste   metoprolol  succinate 100 MG 24 hr tablet Commonly known as: TOPROL -XL   torsemide  20 MG tablet Commonly known as: DEMADEX        TAKE these medications    acetaminophen  500 MG tablet Commonly known as: TYLENOL  Take 2 tablets (1,000 mg total) by mouth in the morning and at bedtime. What changed: when to take this   albuterol  108 (90 Base) MCG/ACT inhaler Commonly known as: VENTOLIN  HFA Inhale 2 puffs into the lungs every 6 (six) hours as needed for wheezing or shortness of breath. What changed: Another medication with the same name was removed. Continue taking this medication, and follow the directions you see here.   ascorbic acid 500 MG tablet Commonly known as: VITAMIN C Take 500 mg by mouth every evening.   aspirin  EC 81 MG tablet Take 1 tablet (81 mg total) by mouth daily.   atorvastatin  80 MG tablet Commonly known as: LIPITOR  Take 1 tablet (80 mg total) by mouth at bedtime.   bisacodyl  10 MG suppository Commonly known as: DULCOLAX Place 1 suppository (10 mg total) rectally daily as needed for moderate constipation.   CertaVite Senior Tabs Take 1 tablet by mouth daily.   dextromethorphan -guaiFENesin  30-600 MG 12hr tablet Commonly known as: MUCINEX  DM Take 1 tablet by mouth 2 (two) times daily.   dicyclomine  10 MG capsule Commonly known as: BENTYL  Take 1 capsule (10 mg total) by mouth in the morning, at noon, in the evening, and at bedtime.    docusate sodium  100 MG capsule Commonly known as: COLACE Take 1 capsule (100 mg total) by mouth 2 (two) times daily.   ferrous sulfate  325 (65 FE) MG tablet Take 1 tablet (325 mg total) by mouth daily with breakfast.   gabapentin  100 MG capsule Commonly known as: NEURONTIN  Take 2 capsules (200 mg total) by mouth 3 (three) times daily.   insulin  aspart 100 UNIT/ML injection Commonly known as: novoLOG  Inject 2-12 Units into the skin in the morning and at bedtime. Per sliding scale, 201-250= 2 units, 251-300= 4 units, 301-350= 6 units, 351-400= 8 units, 401-450= 10 units, 451-500= 12 units, if greater than 400 call MD   isosorbide -hydrALAZINE  20-37.5 MG tablet Commonly known as: BIDIL  Take 2 tablets by mouth three times daily.   latanoprost  0.005 % ophthalmic solution Commonly known as: XALATAN  Place 1 drop into both eyes at bedtime.   lubiprostone  24 MCG capsule Commonly known as: AMITIZA  Take 1 capsule (24 mcg total) by mouth daily.  melatonin 3 MG Tabs tablet Take 1 tablet (3 mg total) by mouth at bedtime.   methocarbamol  500 MG tablet Commonly known as: ROBAXIN  Take 1 tablet (500 mg total) by mouth every 6 (six) hours as needed for muscle spasms.   metoCLOPramide  5 MG tablet Commonly known as: Reglan  Take 1 tablet (5 mg total) by mouth 3 (three) times daily.   nitroGLYCERIN  0.4 MG SL tablet Commonly known as: NITROSTAT  Place 1 tablet (0.4 mg total) under the tongue every 5 (five) minutes as needed for chest pain.   nutrition supplement (JUVEN) Pack Take 1 packet by mouth 2 (two) times daily between meals.   ondansetron  4 MG tablet Commonly known as: ZOFRAN  Take 1 tablet (4 mg total) by mouth every 6 (six) hours as needed for nausea or vomiting.   OXYGEN Inhale 3 L/min into the lungs continuous.   pantoprazole  40 MG tablet Commonly known as: Protonix  Take 1 tablet (40 mg total) by mouth daily.   polyethylene glycol powder 17 GM/SCOOP powder Commonly known  as: GLYCOLAX /MIRALAX  Take 17 g by mouth daily. PRN order: 17 g by mouth daily as needed for constipation   pyridoxine  100 MG tablet Commonly known as: B-6 Take 1 tablet (100 mg total) by mouth daily.   sertraline  25 MG tablet Commonly known as: ZOLOFT  Take 1 tablet (25 mg total) by mouth daily.   sodium chloride  0.65 % Soln nasal spray Commonly known as: OCEAN Place 1 spray into both nostrils as needed for congestion.   traMADol  50 MG tablet Commonly known as: ULTRAM  Take 1 tablet (50 mg total) by mouth every 12 (twelve) hours as needed for moderate pain (pain score 4-6) or severe pain (pain score 7-10). What changed: when to take this   traZODone  100 MG tablet Commonly known as: DESYREL  Take 1 tablet (100 mg total) by mouth at bedtime.   Vitamin B-12 CR 1000 MCG Tbcr Take 1,000 mcg by mouth every Sunday. What changed: Another medication with the same name was removed. Continue taking this medication, and follow the directions you see here.   Vitamin D3 1.25 MG (50000 UT) Caps Take 50,000 Units by mouth every 30 (thirty) days.   ZINC OXIDE (TOPICAL) EX Apply 1 application  topically in the morning and at bedtime. Apply to buttocks topically every day and night shift               Discharge Care Instructions  (From admission, onward)           Start     Ordered   01/07/24 0000  Discharge wound care:       Comments: As per wound care RN's recommendations Wound care  2 times daily      Comments: Paint L great toe, L lateral foot and R plantar foot wounds with Betadine 2 times daily and secure with silicone foam or dry gauze and Kerlix roll guaze    Wound care  Daily      Comments: 1.  Cleanse sacral wound with NS, apply Xeroform gauze David [phone]) to wound bed daily and secure with silicone foam.  Apply silicone foam to healing wounds B buttocks, lift daily to assess 2. Paint heel wounds with Betadine then cover with Xeroform gauze (David [name]) daily.  Cover  with silicone foam or Kerlix roll guaze and place B feet in Prevalon boots to offload pressure   01/07/24 1025              Allergies  Allergen  Reactions   Nsaids Other (See Comments)    CKD stage 3     Consultations: Palliative care consultation is pending   Procedures/Studies: No results found.    Subjective: Patient seen and examined at bedside.  Wife at bedside.  No fever, vomiting, worsening abdominal pain reported.  Discharge Exam: Vitals:   01/07/24 0511 01/07/24 0805  BP: 139/80 (!) 144/75  Pulse: 69 66  Resp:  18  Temp: (!) 97.4 F (36.3 C) 98.3 F (36.8 C)  SpO2: 100% 100%    General: Pt is alert, awake, not in acute distress.  Chronically ill and deconditioned looking.  Slow to respond.  Flat affect.  Poor historian. Cardiovascular: rate controlled, S1/S2 + Respiratory: bilateral decreased breath sounds at bases with scattered crackles Abdominal: Soft, NT, ND, bowel sounds + Extremities: Mild lower extremity edema present; no cyanosis    The results of significant diagnostics from this hospitalization (including imaging, microbiology, ancillary and laboratory) are listed below for reference.     Microbiology: No results found for this or any previous visit (from the past 240 hours).   Labs: BNP (last 3 results) Recent Labs    05/04/23 2047 05/11/23 2310 10/29/23 1746  BNP 455.1* 202.7* 650.9*   Basic Metabolic Panel: Recent Labs  Lab 01/01/24 1947 01/02/24 0110 01/04/24 0250 01/06/24 0249  NA 139 138 139 134*  K 4.8 4.4 4.6 5.2*  CL 108 107 110 106  CO2 22 23 24 22   GLUCOSE 110* 143* 158* 158*  BUN 51* 50* 36* 43*  CREATININE 3.11* 2.99* 2.29* 3.06*  CALCIUM  8.3* 8.1* 7.9* 8.1*  MG  --   --  2.0 2.0  PHOS  --   --  3.1 4.5   Liver Function Tests: Recent Labs  Lab 01/01/24 1947 01/02/24 0110  AST 30 39  ALT 30 33  ALKPHOS 97 105  BILITOT 0.5 0.8  PROT 7.5 7.8  ALBUMIN  2.4* 2.6*   No results for input(s): LIPASE,  AMYLASE in the last 168 hours. No results for input(s): AMMONIA in the last 168 hours. CBC: Recent Labs  Lab 01/01/24 2015 01/02/24 1130 01/04/24 0250 01/04/24 1548 01/06/24 0936  WBC 12.8* 8.2 7.1  --  6.7  NEUTROABS 10.6*  --   --   --  5.4  HGB 7.2* 6.9* 6.8* 9.7* 9.6*  HCT 23.6* 22.0* 22.0* 31.4* 31.2*  MCV 115.7* 114.6* 108.9*  --  105.8*  PLT 232 229 193  --  194   Cardiac Enzymes: No results for input(s): CKTOTAL, CKMB, CKMBINDEX, TROPONINI in the last 168 hours. BNP: Invalid input(s): POCBNP CBG: Recent Labs  Lab 01/06/24 0744 01/06/24 1205 01/06/24 2148 01/07/24 0053 01/07/24 0512  GLUCAP 163* 119* 153* 156* 141*   D-Dimer No results for input(s): DDIMER in the last 72 hours. Hgb A1c No results for input(s): HGBA1C in the last 72 hours. Lipid Profile No results for input(s): CHOL, HDL, LDLCALC, TRIG, CHOLHDL, LDLDIRECT in the last 72 hours. Thyroid  function studies No results for input(s): TSH, T4TOTAL, T3FREE, THYROIDAB in the last 72 hours.  Invalid input(s): FREET3 Anemia work up No results for input(s): VITAMINB12, FOLATE, FERRITIN, TIBC, IRON , RETICCTPCT in the last 72 hours. Urinalysis    Component Value Date/Time   COLORURINE YELLOW 01/02/2024 0110   APPEARANCEUR CLEAR 01/02/2024 0110   LABSPEC 1.014 01/02/2024 0110   PHURINE 5.0 01/02/2024 0110   GLUCOSEU NEGATIVE 01/02/2024 0110   HGBUR NEGATIVE 01/02/2024 0110   BILIRUBINUR NEGATIVE 01/02/2024 0110  KETONESUR NEGATIVE 01/02/2024 0110   PROTEINUR 30 (A) 01/02/2024 0110   UROBILINOGEN 1.0 02/13/2015 1934   NITRITE NEGATIVE 01/02/2024 0110   LEUKOCYTESUR NEGATIVE 01/02/2024 0110   Sepsis Labs Recent Labs  Lab 01/01/24 2015 01/02/24 1130 01/04/24 0250 01/06/24 0936  WBC 12.8* 8.2 7.1 6.7   Microbiology No results found for this or any previous visit (from the past 240 hours).   Time coordinating discharge: 35  minutes  SIGNED:   Sophie Mao, MD  [facility] 01/07/2024, 10:26 AM

## 2024-01-07 NOTE — Progress Notes (Signed)
 DC order noted per MD. DC RN at bedside. Confirmed with TOC CM patient SNF [facility] was approved. Transport coordinated via ambulance. AVS printed and placed and transfer packet, still needs medical necessity packet per TOC CM and report to be called by primary RN.

## 2024-01-07 NOTE — Plan of Care (Signed)
  Problem: Education: Goal: Knowledge of General Education information will improve Description: Including pain rating scale, medication(s)/side effects and non-pharmacologic comfort measures Outcome: Adequate for Discharge   Problem: Health Behavior/Discharge Planning: Goal: Ability to manage health-related needs will improve Outcome: Adequate for Discharge   Problem: Clinical Measurements: Goal: Ability to maintain clinical measurements within normal limits will improve Outcome: Adequate for Discharge Goal: Will remain free from infection Outcome: Adequate for Discharge Goal: Diagnostic test results will improve Outcome: Adequate for Discharge Goal: Respiratory complications will improve Outcome: Adequate for Discharge Goal: Cardiovascular complication will be avoided Outcome: Adequate for Discharge   Problem: Activity: Goal: Risk for activity intolerance will decrease Outcome: Adequate for Discharge   Problem: Nutrition: Goal: Adequate nutrition will be maintained Outcome: Adequate for Discharge   Problem: Coping: Goal: Level of anxiety will decrease Outcome: Adequate for Discharge   Problem: Elimination: Goal: Will not experience complications related to bowel motility Outcome: Adequate for Discharge Goal: Will not experience complications related to urinary retention Outcome: Adequate for Discharge   Problem: Pain Managment: Goal: General experience of comfort will improve and/or be controlled Outcome: Adequate for Discharge   Problem: Safety: Goal: Ability to remain free from injury will improve Outcome: Adequate for Discharge   Problem: Skin Integrity: Goal: Risk for impaired skin integrity will decrease Outcome: Adequate for Discharge   Problem: Acute Rehab PT Goals(only PT should resolve) Goal: Pt will Roll Supine to Side Outcome: Adequate for Discharge Goal: Pt Will Go Supine/Side To Sit Outcome: Adequate for Discharge Goal: Patient Will Perform  Sitting Balance Outcome: Adequate for Discharge Goal: Pt Will Transfer Bed To Chair/Chair To Bed Outcome: Adequate for Discharge   Problem: Acute Rehab OT Goals (only OT should resolve) Goal: Pt. Will Perform Eating Outcome: Adequate for Discharge Goal: Pt. Will Perform Grooming Outcome: Adequate for Discharge Goal: Pt. Will Transfer To Toilet Outcome: Adequate for Discharge Goal: OT Additional ADL Goal #1 Outcome: Adequate for Discharge Goal: OT Additional ADL Goal #2 Outcome: Adequate for Discharge

## 2024-01-07 NOTE — Progress Notes (Signed)
 Report given to [facility] at [place].

## 2024-01-07 NOTE — Progress Notes (Signed)
 Occupational Therapy Treatment Patient Details Name: David [name] MRN: [id number] DOB: 1946/05/11 Today's Date: 01/07/2024   History of present illness 78 y.o. male presenting 8/15 from [place] where he was reported to be lethargic with poor PO intake and labs showing HGB of 6.2. Found to have AKI, FTT, hypoglycemia. PMH significant of CAD, combined CHF, HTN, L fifth metatarsal amputation, chronic BLE ulcers, DMII, diabetic neuropathy, diabetic retinopathy, glaucoma, and CKD   OT comments  Patient received in supine and eager to participate. Patient instructed on bed mobility and max assist to get to EOB with use of bed rail and bed pad. Patient required heavy max assist for sitting balance initially due to posterior leaning. Patient performed trunk flexion exercises and progressed to CGA.  Patient required total assist to return to supine and max assist for positioning in bed.   Patient will benefit from continued inpatient follow up therapy, <3 hours/day.  Acute OT to continue to follow to address established goals to facilitate DC to next venue of care.        If plan is discharge home, recommend the following:  Two people to help with walking and/or transfers;Two people to help with bathing/dressing/bathroom;Assistance with cooking/housework;Direct supervision/assist for medications management;Direct supervision/assist for financial management;Assist for transportation;Help with stairs or ramp for entrance;Assistance with feeding   Equipment Recommendations  Other (comment) (defer)    Recommendations for Other Services      Precautions / Restrictions Precautions Precautions: Fall Recall of Precautions/Restrictions: Intact Restrictions Weight Bearing Restrictions Per Provider Order: No       Mobility Bed Mobility Overal bed mobility: Needs Assistance Bed Mobility: Supine to Sit, Sit to Supine     Supine to sit: Max assist, Used rails Sit to supine: Total assist    General bed mobility comments: requires assistance for trunk and BLE, bed pad used to assist with scooting hips towards EOB    Transfers                   General transfer comment: not attempted     Balance Overall balance assessment: Needs assistance Sitting-balance support: Bilateral upper extremity supported, Feet supported Sitting balance-Leahy Scale: Poor Sitting balance - Comments: max assist initially and progressed to CGA at end of session Postural control: Posterior lean                                 ADL either performed or assessed with clinical judgement   ADL Overall ADL's : Needs assistance/impaired     Grooming: Wash/dry face;Minimal assistance;Sitting Grooming Details (indicate cue type and reason): on EOB                               General ADL Comments: focused on bed mobility and sitting balance    Extremity/Trunk Assessment              Vision       Perception     Praxis     Communication Communication Communication: No apparent difficulties;Impaired Factors Affecting Communication: Reduced clarity of speech   Cognition Arousal: Alert Behavior During Therapy: WFL for tasks assessed/performed Cognition: Cognition impaired     Awareness: Intellectual awareness intact, Online awareness impaired Memory impairment (select all impairments): Short-term memory Attention impairment (select first level of impairment): Sustained attention, Selective attention Executive functioning impairment (select all impairments): Organization, Sequencing, Problem solving  Following commands: Intact        Cueing   Cueing Techniques: Verbal cues, Tactile cues  Exercises      Shoulder Instructions       General Comments      Pertinent Vitals/ Pain       Pain Assessment Pain Assessment: Faces Faces Pain Scale: Hurts a little bit Pain Location: generalized with movement Pain Descriptors  / Indicators: Discomfort, Grimacing Pain Intervention(s): Limited activity within patient's tolerance, Monitored during session, Repositioned  Home Living                                          Prior Functioning/Environment              Frequency  Min 1X/week        Progress Toward Goals  OT Goals(current goals can now be found in the care plan section)  Progress towards OT goals: Progressing toward goals  Acute Rehab OT Goals Patient Stated Goal: get better OT Goal Formulation: With patient Time For Goal Achievement: 01/17/24 Potential to Achieve Goals: Fair ADL Goals Pt Will Perform Eating: with set-up;bed level Pt Will Perform Grooming: with min assist;sitting Pt Will Transfer to Toilet: with mod assist;with +2 assist;stand pivot transfer Additional ADL Goal #1: Pt will perform bed mobility with min A as a precursor to ADL Additional ADL Goal #2: Pt will sit EOB with CGA as a precursor to ADL  Plan      Co-evaluation                 AM-PAC OT 6 Clicks Daily Activity     Outcome Measure   Help from another person eating meals?: A Lot Help from another person taking care of personal grooming?: A Little Help from another person toileting, which includes using toliet, bedpan, or urinal?: Total Help from another person bathing (including washing, rinsing, drying)?: Total Help from another person to put on and taking off regular upper body clothing?: A Lot Help from another person to put on and taking off regular lower body clothing?: Total 6 Click Score: 10    End of Session    OT Visit Diagnosis: Unsteadiness on feet (R26.81);Muscle weakness (generalized) (M62.81);Low vision, both eyes (H54.2);Feeding difficulties (R63.3);Other symptoms and signs involving cognitive function   Activity Tolerance Patient tolerated treatment well   Patient Left in bed;with call bell/phone within reach;with bed alarm set;with family/visitor present    Nurse Communication Mobility status        Time: 8763-8740 OT Time Calculation (min): 23 min  Charges: OT General Charges $OT Visit: 1 Visit OT Treatments $Therapeutic Activity: 23-37 mins  Dick [name], OTA Acute Rehabilitation Services  Office [phone]   David [name] 01/07/2024, 3:21 PM

## 2024-01-07 NOTE — TOC Progression Note (Signed)
 Transition of Care [facility]) - Progression Note    Patient Details  Name: David [name] MRN: [id number] Date of Birth: Sep 23, 1945  [facility]) CM/SW Contact  Trenden Hazelrigg A [place], LCSW Phone Number: 01/07/2024, 11:26 AM  Clinical Narrative:     CSW contacted admitting, confirmed pt's member ID is current.  CSW reached out to [facility] to start authorization as pt could not be pulled up in the portal. Agent informed CSW that the current member ID information would not pull up pt and CSW unable to complete authorization.   CSW then reached out to [facility], via provider line, [phone].    Confirmed that pt's current subscriber ID: [id number]  is current. Start date of 12/18/23 to 05/18/24.   Pt's authorization pending, Reference #  [id number], currently pending. CSW sent clinicals to [phone].   [facility] notified of pending authorization, if approved, pt to DC today.   CSW will continue to follow.   Expected Discharge Plan: Skilled Nursing Facility Barriers to Discharge: Continued Medical Work up               Expected Discharge Plan and Services In-house Referral: Clinical Social Work     Living arrangements for the past 2 months: Skilled Holiday representative (from [place]) Expected Discharge Date: 01/07/24                                     Social Drivers of Health (SDOH) Interventions SDOH Screenings   Food Insecurity: No Food Insecurity (01/02/2024)  Housing: Low Risk  (01/02/2024)  Transportation Needs: No Transportation Needs (01/02/2024)  Utilities: Not At Risk (01/02/2024)  Alcohol Screen: Low Risk  (05/18/2023)  Financial Resource Strain: Low Risk  (05/18/2023)  Physical Activity: Inactive (08/20/2017)  Social Connections: Socially Integrated (01/02/2024)  Stress: No Stress Concern Present (08/20/2017)  Tobacco Use: Medium Risk (01/02/2024)    Readmission Risk Interventions    11/09/2023   11:30 AM 11/06/2023    8:44  AM  Readmission Risk Prevention Plan  Transportation Screening Complete Complete  Medication Review (RN Care Manager) Complete Complete  PCP or Specialist appointment within 3-5 days of discharge Complete   HRI or Home Care Consult Complete Complete  SW Recovery Care/Counseling Consult Complete Complete  Palliative Care Screening Not Applicable Not Applicable  Skilled Nursing Facility Complete Not Applicable

## 2024-01-07 NOTE — TOC Transition Note (Signed)
 Transition of Care [facility]) - Discharge Note   Patient Details  Name: David [name] MRN: [id number] Date of Birth: 05/24/1945  Transition of Care [facility]) CM/SW Contact:  Constantina Laseter A [place], LCSW Phone Number: 01/07/2024, 2:53 PM   Clinical Narrative:     Patient will DC to: [facility]  Anticipated DC date: 01/07/24  Family notified: [name] Collum  Transport by: [facility]  CSW was contacted by [facility] and notified that pt's insurance plan has a skilled rehab component and pt can DC to facility and [facility] will have the contracted nurses do assessment for pt and continue with rehab services. Pt is able to DC without needed authorization approval finalization. CSW contacted Logan with [facility] and notified.    Per MD patient ready for DC to [facility]. RN, patient, patient's family, and facility notified of DC. Discharge Summary and FL2 sent to facility. RN to call report prior to discharge [phone]). DC packet on chart. Ambulance transport requested for patient.     CSW will sign off for now as social work intervention is no longer needed. Please consult us  again if new needs arise.   Final next level of care: Skilled Nursing Facility Barriers to Discharge: Barriers Resolved   Patient Goals and CMS Choice Patient states their goals for this hospitalization and ongoing recovery are:: [facility], rehab CMS Medicare.gov Compare Post Acute Care list provided to:: Patient Choice offered to / list presented to : Patient      Discharge Placement              Patient chooses bed at: [facility] Patient to be transferred to facility by: [facility] Name of family member notified: [name] Collum, pt's wife Patient and family notified of of transfer: 01/07/24  Discharge Plan and Services Additional resources added to the After Visit Summary for   In-house Referral: Clinical Social Work                                   Social Drivers of Health (SDOH) Interventions SDOH  Screenings   Food Insecurity: No Food Insecurity (01/02/2024)  Housing: Low Risk  (01/02/2024)  Transportation Needs: No Transportation Needs (01/02/2024)  Utilities: Not At Risk (01/02/2024)  Alcohol Screen: Low Risk  (05/18/2023)  Financial Resource Strain: Low Risk  (05/18/2023)  Physical Activity: Inactive (08/20/2017)  Social Connections: Socially Integrated (01/02/2024)  Stress: No Stress Concern Present (08/20/2017)  Tobacco Use: Medium Risk (01/02/2024)     Readmission Risk Interventions    11/09/2023   11:30 AM 11/06/2023    8:44 AM  Readmission Risk Prevention Plan  Transportation Screening Complete Complete  Medication Review [facility]) Complete Complete  PCP or Specialist appointment within 3-5 days of discharge Complete   HRI or Home Care Consult Complete Complete  SW Recovery Care/Counseling Consult Complete Complete  Palliative Care Screening Not Applicable Not Applicable  Skilled Nursing Facility Complete Not Applicable

## 2024-01-07 NOTE — Progress Notes (Signed)
 Patient refused pictures and measurement of sacral wound. Attempted twice today.

## 2024-01-07 NOTE — Plan of Care (Signed)

## 2024-01-07 NOTE — Care Management Important Message (Signed)
 Important Message  Patient Details  Name: David [name] MRN: [id number] Date of Birth: [birth year]   Important Message Given:  Yes - Medicare IM     Claretta Deed 01/07/2024, 8:33 AM

## 2024-02-03 ENCOUNTER — Inpatient Hospital Stay (HOSPITAL_COMMUNITY): Admission: EM | Admit: 2024-02-03 | Discharge: 2024-03-19 | DRG: 291 | Disposition: E | Source: Skilled Nursing Facility

## 2024-02-03 ENCOUNTER — Inpatient Hospital Stay (HOSPITAL_COMMUNITY)

## 2024-02-03 ENCOUNTER — Emergency Department (HOSPITAL_COMMUNITY)

## 2024-02-03 ENCOUNTER — Encounter (HOSPITAL_COMMUNITY): Payer: Self-pay | Admitting: Emergency Medicine

## 2024-02-03 DIAGNOSIS — E43 Unspecified severe protein-calorie malnutrition: Secondary | ICD-10-CM | POA: Diagnosis present

## 2024-02-03 DIAGNOSIS — I70261 Atherosclerosis of native arteries of extremities with gangrene, right leg: Secondary | ICD-10-CM | POA: Diagnosis not present

## 2024-02-03 DIAGNOSIS — K3184 Gastroparesis: Secondary | ICD-10-CM | POA: Diagnosis present

## 2024-02-03 DIAGNOSIS — R7401 Elevation of levels of liver transaminase levels: Secondary | ICD-10-CM | POA: Diagnosis not present

## 2024-02-03 DIAGNOSIS — Z8679 Personal history of other diseases of the circulatory system: Secondary | ICD-10-CM | POA: Diagnosis not present

## 2024-02-03 DIAGNOSIS — I132 Hypertensive heart and chronic kidney disease with heart failure and with stage 5 chronic kidney disease, or end stage renal disease: Principal | ICD-10-CM | POA: Diagnosis present

## 2024-02-03 DIAGNOSIS — E1152 Type 2 diabetes mellitus with diabetic peripheral angiopathy with gangrene: Secondary | ICD-10-CM | POA: Diagnosis present

## 2024-02-03 DIAGNOSIS — E872 Acidosis, unspecified: Secondary | ICD-10-CM | POA: Diagnosis present

## 2024-02-03 DIAGNOSIS — R0602 Shortness of breath: Secondary | ICD-10-CM

## 2024-02-03 DIAGNOSIS — J690 Pneumonitis due to inhalation of food and vomit: Secondary | ICD-10-CM | POA: Diagnosis not present

## 2024-02-03 DIAGNOSIS — I468 Cardiac arrest due to other underlying condition: Secondary | ICD-10-CM | POA: Diagnosis not present

## 2024-02-03 DIAGNOSIS — R578 Other shock: Secondary | ICD-10-CM | POA: Diagnosis not present

## 2024-02-03 DIAGNOSIS — I10 Essential (primary) hypertension: Secondary | ICD-10-CM | POA: Diagnosis present

## 2024-02-03 DIAGNOSIS — Z96641 Presence of right artificial hip joint: Secondary | ICD-10-CM | POA: Diagnosis present

## 2024-02-03 DIAGNOSIS — R001 Bradycardia, unspecified: Secondary | ICD-10-CM | POA: Diagnosis not present

## 2024-02-03 DIAGNOSIS — I5033 Acute on chronic diastolic (congestive) heart failure: Secondary | ICD-10-CM | POA: Diagnosis present

## 2024-02-03 DIAGNOSIS — L97919 Non-pressure chronic ulcer of unspecified part of right lower leg with unspecified severity: Secondary | ICD-10-CM | POA: Diagnosis present

## 2024-02-03 DIAGNOSIS — D509 Iron deficiency anemia, unspecified: Secondary | ICD-10-CM | POA: Diagnosis present

## 2024-02-03 DIAGNOSIS — D638 Anemia in other chronic diseases classified elsewhere: Secondary | ICD-10-CM | POA: Diagnosis not present

## 2024-02-03 DIAGNOSIS — E1122 Type 2 diabetes mellitus with diabetic chronic kidney disease: Secondary | ICD-10-CM | POA: Diagnosis not present

## 2024-02-03 DIAGNOSIS — I5041 Acute combined systolic (congestive) and diastolic (congestive) heart failure: Secondary | ICD-10-CM

## 2024-02-03 DIAGNOSIS — N19 Unspecified kidney failure: Secondary | ICD-10-CM

## 2024-02-03 DIAGNOSIS — F32A Depression, unspecified: Secondary | ICD-10-CM | POA: Diagnosis present

## 2024-02-03 DIAGNOSIS — E119 Type 2 diabetes mellitus without complications: Secondary | ICD-10-CM

## 2024-02-03 DIAGNOSIS — Z955 Presence of coronary angioplasty implant and graft: Secondary | ICD-10-CM

## 2024-02-03 DIAGNOSIS — E1142 Type 2 diabetes mellitus with diabetic polyneuropathy: Secondary | ICD-10-CM | POA: Diagnosis present

## 2024-02-03 DIAGNOSIS — I4891 Unspecified atrial fibrillation: Secondary | ICD-10-CM | POA: Diagnosis not present

## 2024-02-03 DIAGNOSIS — L97519 Non-pressure chronic ulcer of other part of right foot with unspecified severity: Secondary | ICD-10-CM | POA: Diagnosis present

## 2024-02-03 DIAGNOSIS — I472 Ventricular tachycardia, unspecified: Secondary | ICD-10-CM | POA: Diagnosis not present

## 2024-02-03 DIAGNOSIS — E1169 Type 2 diabetes mellitus with other specified complication: Secondary | ICD-10-CM | POA: Diagnosis present

## 2024-02-03 DIAGNOSIS — N184 Chronic kidney disease, stage 4 (severe): Secondary | ICD-10-CM

## 2024-02-03 DIAGNOSIS — M8610 Other acute osteomyelitis, unspecified site: Secondary | ICD-10-CM

## 2024-02-03 DIAGNOSIS — I5021 Acute systolic (congestive) heart failure: Secondary | ICD-10-CM | POA: Diagnosis not present

## 2024-02-03 DIAGNOSIS — Z66 Do not resuscitate: Secondary | ICD-10-CM | POA: Diagnosis not present

## 2024-02-03 DIAGNOSIS — Z89421 Acquired absence of other right toe(s): Secondary | ICD-10-CM

## 2024-02-03 DIAGNOSIS — M86271 Subacute osteomyelitis, right ankle and foot: Secondary | ICD-10-CM | POA: Diagnosis not present

## 2024-02-03 DIAGNOSIS — I739 Peripheral vascular disease, unspecified: Secondary | ICD-10-CM | POA: Diagnosis not present

## 2024-02-03 DIAGNOSIS — M86171 Other acute osteomyelitis, right ankle and foot: Secondary | ICD-10-CM | POA: Diagnosis present

## 2024-02-03 DIAGNOSIS — J9621 Acute and chronic respiratory failure with hypoxia: Secondary | ICD-10-CM | POA: Diagnosis not present

## 2024-02-03 DIAGNOSIS — Z89411 Acquired absence of right great toe: Secondary | ICD-10-CM

## 2024-02-03 DIAGNOSIS — H409 Unspecified glaucoma: Secondary | ICD-10-CM | POA: Diagnosis present

## 2024-02-03 DIAGNOSIS — R791 Abnormal coagulation profile: Secondary | ICD-10-CM | POA: Diagnosis not present

## 2024-02-03 DIAGNOSIS — L89322 Pressure ulcer of left buttock, stage 2: Secondary | ICD-10-CM | POA: Diagnosis present

## 2024-02-03 DIAGNOSIS — F411 Generalized anxiety disorder: Secondary | ICD-10-CM | POA: Diagnosis present

## 2024-02-03 DIAGNOSIS — I2722 Pulmonary hypertension due to left heart disease: Secondary | ICD-10-CM | POA: Diagnosis present

## 2024-02-03 DIAGNOSIS — M86161 Other acute osteomyelitis, right tibia and fibula: Secondary | ICD-10-CM | POA: Diagnosis not present

## 2024-02-03 DIAGNOSIS — Z91148 Patient's other noncompliance with medication regimen for other reason: Secondary | ICD-10-CM

## 2024-02-03 DIAGNOSIS — G9341 Metabolic encephalopathy: Secondary | ICD-10-CM | POA: Diagnosis present

## 2024-02-03 DIAGNOSIS — I96 Gangrene, not elsewhere classified: Secondary | ICD-10-CM | POA: Diagnosis not present

## 2024-02-03 DIAGNOSIS — Z79899 Other long term (current) drug therapy: Secondary | ICD-10-CM

## 2024-02-03 DIAGNOSIS — E870 Hyperosmolality and hypernatremia: Secondary | ICD-10-CM | POA: Diagnosis present

## 2024-02-03 DIAGNOSIS — D631 Anemia in chronic kidney disease: Secondary | ICD-10-CM | POA: Diagnosis present

## 2024-02-03 DIAGNOSIS — D539 Nutritional anemia, unspecified: Secondary | ICD-10-CM | POA: Diagnosis present

## 2024-02-03 DIAGNOSIS — I5043 Acute on chronic combined systolic (congestive) and diastolic (congestive) heart failure: Secondary | ICD-10-CM | POA: Diagnosis present

## 2024-02-03 DIAGNOSIS — N179 Acute kidney failure, unspecified: Secondary | ICD-10-CM | POA: Diagnosis present

## 2024-02-03 DIAGNOSIS — L89610 Pressure ulcer of right heel, unstageable: Secondary | ICD-10-CM | POA: Diagnosis present

## 2024-02-03 DIAGNOSIS — Z794 Long term (current) use of insulin: Secondary | ICD-10-CM

## 2024-02-03 DIAGNOSIS — I509 Heart failure, unspecified: Secondary | ICD-10-CM | POA: Insufficient documentation

## 2024-02-03 DIAGNOSIS — M869 Osteomyelitis, unspecified: Secondary | ICD-10-CM

## 2024-02-03 DIAGNOSIS — N186 End stage renal disease: Secondary | ICD-10-CM | POA: Diagnosis present

## 2024-02-03 DIAGNOSIS — Z833 Family history of diabetes mellitus: Secondary | ICD-10-CM

## 2024-02-03 DIAGNOSIS — M868X7 Other osteomyelitis, ankle and foot: Secondary | ICD-10-CM | POA: Diagnosis not present

## 2024-02-03 DIAGNOSIS — Z7982 Long term (current) use of aspirin: Secondary | ICD-10-CM

## 2024-02-03 DIAGNOSIS — L97929 Non-pressure chronic ulcer of unspecified part of left lower leg with unspecified severity: Secondary | ICD-10-CM | POA: Diagnosis present

## 2024-02-03 DIAGNOSIS — E785 Hyperlipidemia, unspecified: Secondary | ICD-10-CM | POA: Diagnosis not present

## 2024-02-03 DIAGNOSIS — Z87891 Personal history of nicotine dependence: Secondary | ICD-10-CM

## 2024-02-03 DIAGNOSIS — Z886 Allergy status to analgesic agent status: Secondary | ICD-10-CM

## 2024-02-03 DIAGNOSIS — J9601 Acute respiratory failure with hypoxia: Secondary | ICD-10-CM | POA: Diagnosis not present

## 2024-02-03 DIAGNOSIS — Z8249 Family history of ischemic heart disease and other diseases of the circulatory system: Secondary | ICD-10-CM

## 2024-02-03 DIAGNOSIS — R579 Shock, unspecified: Secondary | ICD-10-CM | POA: Diagnosis not present

## 2024-02-03 DIAGNOSIS — I469 Cardiac arrest, cause unspecified: Secondary | ICD-10-CM | POA: Diagnosis not present

## 2024-02-03 DIAGNOSIS — K219 Gastro-esophageal reflux disease without esophagitis: Secondary | ICD-10-CM | POA: Diagnosis present

## 2024-02-03 DIAGNOSIS — Z9981 Dependence on supplemental oxygen: Secondary | ICD-10-CM

## 2024-02-03 DIAGNOSIS — E1143 Type 2 diabetes mellitus with diabetic autonomic (poly)neuropathy: Secondary | ICD-10-CM | POA: Diagnosis present

## 2024-02-03 DIAGNOSIS — R34 Anuria and oliguria: Secondary | ICD-10-CM | POA: Diagnosis not present

## 2024-02-03 DIAGNOSIS — E11319 Type 2 diabetes mellitus with unspecified diabetic retinopathy without macular edema: Secondary | ICD-10-CM | POA: Diagnosis present

## 2024-02-03 DIAGNOSIS — L8990 Pressure ulcer of unspecified site, unspecified stage: Secondary | ICD-10-CM

## 2024-02-03 DIAGNOSIS — E11621 Type 2 diabetes mellitus with foot ulcer: Secondary | ICD-10-CM | POA: Diagnosis present

## 2024-02-03 DIAGNOSIS — R339 Retention of urine, unspecified: Secondary | ICD-10-CM | POA: Diagnosis present

## 2024-02-03 DIAGNOSIS — K922 Gastrointestinal hemorrhage, unspecified: Secondary | ICD-10-CM | POA: Diagnosis not present

## 2024-02-03 DIAGNOSIS — E7849 Other hyperlipidemia: Secondary | ICD-10-CM | POA: Diagnosis present

## 2024-02-03 DIAGNOSIS — I2510 Atherosclerotic heart disease of native coronary artery without angina pectoris: Secondary | ICD-10-CM | POA: Diagnosis present

## 2024-02-03 DIAGNOSIS — Z6824 Body mass index (BMI) 24.0-24.9, adult: Secondary | ICD-10-CM

## 2024-02-03 DIAGNOSIS — I130 Hypertensive heart and chronic kidney disease with heart failure and stage 1 through stage 4 chronic kidney disease, or unspecified chronic kidney disease: Secondary | ICD-10-CM | POA: Diagnosis not present

## 2024-02-03 DIAGNOSIS — I272 Pulmonary hypertension, unspecified: Secondary | ICD-10-CM | POA: Diagnosis not present

## 2024-02-03 DIAGNOSIS — Z89422 Acquired absence of other left toe(s): Secondary | ICD-10-CM

## 2024-02-03 LAB — CBC WITH DIFFERENTIAL/PLATELET
Basophils Absolute: 0 K/uL (ref 0.0–0.1)
Basophils Relative: 0 %
Eosinophils Absolute: 0 K/uL (ref 0.0–0.5)
Eosinophils Relative: 0 %
HCT: 31.9 % — ABNORMAL LOW (ref 39.0–52.0)
Hemoglobin: 9.6 g/dL — ABNORMAL LOW (ref 13.0–17.0)
Lymphocytes Relative: 3 %
Lymphs Abs: 0.2 K/uL — ABNORMAL LOW (ref 0.7–4.0)
MCH: 33.4 pg (ref 26.0–34.0)
MCHC: 30.1 g/dL (ref 30.0–36.0)
MCV: 111.1 fL — ABNORMAL HIGH (ref 80.0–100.0)
Monocytes Absolute: 0.7 K/uL (ref 0.1–1.0)
Monocytes Relative: 9 %
Neutro Abs: 6.8 K/uL (ref 1.7–7.7)
Neutrophils Relative %: 88 %
Platelets: 155 K/uL (ref 150–400)
RBC: 2.87 MIL/uL — ABNORMAL LOW (ref 4.22–5.81)
RDW: 25.2 % — ABNORMAL HIGH (ref 11.5–15.5)
WBC: 7.7 K/uL (ref 4.0–10.5)
nRBC: 0 % (ref 0.0–0.2)

## 2024-02-03 LAB — GLUCOSE, CAPILLARY: Glucose-Capillary: 128 mg/dL — ABNORMAL HIGH (ref 70–99)

## 2024-02-03 LAB — CBC
HCT: 32.6 % — ABNORMAL LOW (ref 39.0–52.0)
Hemoglobin: 9.7 g/dL — ABNORMAL LOW (ref 13.0–17.0)
MCH: 33.6 pg (ref 26.0–34.0)
MCHC: 29.8 g/dL — ABNORMAL LOW (ref 30.0–36.0)
MCV: 112.8 fL — ABNORMAL HIGH (ref 80.0–100.0)
Platelets: 149 K/uL — ABNORMAL LOW (ref 150–400)
RBC: 2.89 MIL/uL — ABNORMAL LOW (ref 4.22–5.81)
RDW: 25.4 % — ABNORMAL HIGH (ref 11.5–15.5)
WBC: 7.2 K/uL (ref 4.0–10.5)
nRBC: 0 % (ref 0.0–0.2)

## 2024-02-03 LAB — BASIC METABOLIC PANEL WITH GFR
Anion gap: 12 (ref 5–15)
BUN: 120 mg/dL — ABNORMAL HIGH (ref 8–23)
CO2: 18 mmol/L — ABNORMAL LOW (ref 22–32)
Calcium: 9.1 mg/dL (ref 8.9–10.3)
Chloride: 110 mmol/L (ref 98–111)
Creatinine, Ser: 5.01 mg/dL — ABNORMAL HIGH (ref 0.61–1.24)
GFR, Estimated: 11 mL/min — ABNORMAL LOW (ref >60)
Glucose, Bld: 123 mg/dL — ABNORMAL HIGH (ref 70–99)
Potassium: 4.9 mmol/L (ref 3.5–5.1)
Sodium: 140 mmol/L (ref 135–145)

## 2024-02-03 LAB — BRAIN NATRIURETIC PEPTIDE: B Natriuretic Peptide: 704.6 pg/mL — ABNORMAL HIGH (ref 0.0–100.0)

## 2024-02-03 LAB — LACTIC ACID, PLASMA: Lactic Acid, Venous: 1.3 mmol/L (ref 0.5–1.9)

## 2024-02-03 LAB — IRON AND TIBC
Iron: 103 ug/dL (ref 45–182)
Saturation Ratios: 67 % — ABNORMAL HIGH (ref 17.9–39.5)
TIBC: 154 ug/dL — ABNORMAL LOW (ref 250–450)
UIBC: 51 ug/dL

## 2024-02-03 LAB — RESP PANEL BY RT-PCR (RSV, FLU A&B, COVID)  RVPGX2
Influenza A by PCR: NEGATIVE
Influenza B by PCR: NEGATIVE
Resp Syncytial Virus by PCR: NEGATIVE
SARS Coronavirus 2 by RT PCR: NEGATIVE

## 2024-02-03 LAB — FERRITIN: Ferritin: 696 ng/mL — ABNORMAL HIGH (ref 24–336)

## 2024-02-03 LAB — SEDIMENTATION RATE: Sed Rate: 73 mm/h — ABNORMAL HIGH (ref 0–16)

## 2024-02-03 LAB — CREATININE, SERUM
Creatinine, Ser: 5.17 mg/dL — ABNORMAL HIGH (ref 0.61–1.24)
GFR, Estimated: 11 mL/min — ABNORMAL LOW (ref >60)

## 2024-02-03 MED ORDER — IPRATROPIUM-ALBUTEROL 0.5-2.5 (3) MG/3ML IN SOLN
3.0000 mL | Freq: Once | RESPIRATORY_TRACT | Status: AC
  Administered 2024-02-03: 3 mL via RESPIRATORY_TRACT
  Filled 2024-02-03: qty 3

## 2024-02-03 MED ORDER — NITROGLYCERIN 0.4 MG SL SUBL: 0.4000 mg | SUBLINGUAL_TABLET | SUBLINGUAL | Status: DC | PRN

## 2024-02-03 MED ORDER — ONDANSETRON HCL 4 MG/2ML IJ SOLN
4.0000 mg | Freq: Four times a day (QID) | INTRAMUSCULAR | Status: DC | PRN
  Administered 2024-02-03 – 2024-02-13 (×3): 4 mg via INTRAVENOUS
  Filled 2024-02-03 (×5): qty 2

## 2024-02-03 MED ORDER — VANCOMYCIN HCL 1750 MG/350ML IV SOLN
1750.0000 mg | Freq: Once | INTRAVENOUS | Status: AC
  Administered 2024-02-03: 1750 mg via INTRAVENOUS
  Filled 2024-02-03: qty 350

## 2024-02-03 MED ORDER — ASPIRIN 81 MG PO TBEC
81.0000 mg | DELAYED_RELEASE_TABLET | Freq: Every day | ORAL | Status: DC
Start: 2024-02-03 — End: 2024-02-08
  Administered 2024-02-03 – 2024-02-07 (×5): 81 mg via ORAL
  Filled 2024-02-03 (×6): qty 1

## 2024-02-03 MED ORDER — SERTRALINE HCL 50 MG PO TABS
25.0000 mg | ORAL_TABLET | Freq: Every day | ORAL | Status: DC
Start: 2024-02-03 — End: 2024-02-08
  Administered 2024-02-03 – 2024-02-07 (×5): 25 mg via ORAL
  Filled 2024-02-03 (×6): qty 1

## 2024-02-03 MED ORDER — ATORVASTATIN CALCIUM 40 MG PO TABS
80.0000 mg | ORAL_TABLET | Freq: Every day | ORAL | Status: DC
Start: 2024-02-03 — End: 2024-02-08
  Administered 2024-02-03 – 2024-02-07 (×4): 80 mg via ORAL
  Filled 2024-02-03 (×5): qty 1

## 2024-02-03 MED ORDER — DOCUSATE SODIUM 100 MG PO CAPS
100.0000 mg | ORAL_CAPSULE | Freq: Two times a day (BID) | ORAL | Status: DC
Start: 2024-02-03 — End: 2024-02-08
  Administered 2024-02-03 – 2024-02-07 (×7): 100 mg via ORAL
  Filled 2024-02-03 (×9): qty 1

## 2024-02-03 MED ORDER — PIPERACILLIN-TAZOBACTAM IN DEX 2-0.25 GM/50ML IV SOLN
2.2500 g | Freq: Four times a day (QID) | INTRAVENOUS | Status: DC
  Administered 2024-02-04 – 2024-02-05 (×6): 2.25 g via INTRAVENOUS
  Filled 2024-02-03 (×9): qty 50

## 2024-02-03 MED ORDER — SODIUM CHLORIDE 0.9% FLUSH: 3.0000 mL | INTRAVENOUS | Status: DC | PRN

## 2024-02-03 MED ORDER — INSULIN ASPART 100 UNIT/ML IJ SOLN: 0.0000 [IU] | Freq: Three times a day (TID) | INTRAMUSCULAR | Status: DC

## 2024-02-03 MED ORDER — FUROSEMIDE 10 MG/ML IJ SOLN
40.0000 mg | Freq: Two times a day (BID) | INTRAMUSCULAR | Status: DC
Start: 2024-02-04 — End: 2024-02-04
  Administered 2024-02-04: 40 mg via INTRAVENOUS
  Filled 2024-02-03: qty 4

## 2024-02-03 MED ORDER — LATANOPROST 0.005 % OP SOLN
1.0000 [drp] | Freq: Every day | OPHTHALMIC | Status: DC
  Administered 2024-02-03 – 2024-02-16 (×14): 1 [drp] via OPHTHALMIC
  Filled 2024-02-03 (×2): qty 2.5

## 2024-02-03 MED ORDER — ONDANSETRON HCL 4 MG PO TABS: 4.0000 mg | ORAL_TABLET | Freq: Four times a day (QID) | ORAL | Status: DC | PRN

## 2024-02-03 MED ORDER — FUROSEMIDE 10 MG/ML IJ SOLN
40.0000 mg | Freq: Once | INTRAMUSCULAR | Status: AC
Start: 2024-02-03 — End: 2024-02-03
  Administered 2024-02-03: 40 mg via INTRAVENOUS
  Filled 2024-02-03: qty 4

## 2024-02-03 MED ORDER — SODIUM CHLORIDE 0.9% FLUSH
3.0000 mL | Freq: Two times a day (BID) | INTRAVENOUS | Status: DC
  Administered 2024-02-03 – 2024-02-17 (×27): 3 mL via INTRAVENOUS

## 2024-02-03 MED ORDER — HEPARIN SODIUM (PORCINE) 5000 UNIT/ML IJ SOLN
5000.0000 [IU] | Freq: Three times a day (TID) | INTRAMUSCULAR | Status: DC
Start: 2024-02-03 — End: 2024-02-16
  Administered 2024-02-03 – 2024-02-16 (×38): 5000 [IU] via SUBCUTANEOUS
  Filled 2024-02-03 (×38): qty 1

## 2024-02-03 MED ORDER — BISACODYL 10 MG RE SUPP: 10.0000 mg | Freq: Every day | RECTAL | Status: DC | PRN

## 2024-02-03 MED ORDER — TRAMADOL HCL 50 MG PO TABS
50.0000 mg | ORAL_TABLET | Freq: Two times a day (BID) | ORAL | Status: DC | PRN
  Administered 2024-02-05: 50 mg via ORAL
  Filled 2024-02-03: qty 1

## 2024-02-03 MED ORDER — ACETAMINOPHEN 650 MG RE SUPP: 650.0000 mg | Freq: Four times a day (QID) | RECTAL | Status: DC | PRN

## 2024-02-03 MED ORDER — TRAZODONE HCL 50 MG PO TABS
100.0000 mg | ORAL_TABLET | Freq: Every day | ORAL | Status: DC
Start: 2024-02-03 — End: 2024-02-08
  Administered 2024-02-03 – 2024-02-07 (×4): 100 mg via ORAL
  Filled 2024-02-03 (×4): qty 1

## 2024-02-03 MED ORDER — ACETAMINOPHEN 325 MG PO TABS
650.0000 mg | ORAL_TABLET | Freq: Four times a day (QID) | ORAL | Status: DC | PRN
  Administered 2024-02-05: 650 mg via ORAL
  Filled 2024-02-03: qty 2

## 2024-02-03 MED ORDER — DICYCLOMINE HCL 10 MG PO CAPS
10.0000 mg | ORAL_CAPSULE | Freq: Three times a day (TID) | ORAL | Status: DC
Start: 2024-02-03 — End: 2024-02-08
  Administered 2024-02-03 – 2024-02-07 (×11): 10 mg via ORAL
  Filled 2024-02-03 (×22): qty 1

## 2024-02-03 MED ORDER — VANCOMYCIN VARIABLE DOSE PER UNSTABLE RENAL FUNCTION (PHARMACIST DOSING): Status: DC

## 2024-02-03 MED ORDER — SODIUM CHLORIDE 0.9 % IV SOLN: 250.0000 mL | INTRAVENOUS | Status: AC | PRN

## 2024-02-03 NOTE — ED Provider Notes (Signed)
 [facility]ERGENC[facility] DEPARTMENT AT [facility] Provider Note   CSN: [id number] Arrival date & time: 02/03/24  9083     Patient presents with: No chief complaint on file.   David [name] is a 78 y.o. male.   HPI     78 year old male with a history of CHF with reduced ejection fraction of 30 to 35%, chronic microcytic anemia, hypertension, CKD stage IV, coronary artery disease status post cardiac stents, insulin -dependent diabetes type 2, chronic diabetic foot ulcers, recent admission with concern for AKI on CKD, failure to thrive who presents from skilled nursing facility with concern for shortness of breath.  Reports he has had a cough over the last week which has become more productive.  He is not able to see what color the phlegm is as he swallows it right away.  The cough is worsening to the point that yesterday he coughed to the point of vomiting.  He developed shortness of breath yesterday.  He had some nausea beginning yesterday.  Denies any known fevers.  Denies chest pain, abdominal pain, diarrhea or constipation.  He does not have any feeling in his feet due to his diabetic neuropathy.  His ulcers have been wrapped at the facility.  Past Medical History:  Diagnosis Date   Anemia    Arthritis    CAD in native artery    s/p stent in 11/18   CHF (congestive heart failure) (HCC)    normal echo in 11/18   CKD (chronic kidney disease) stage 3, GFR 30-59 ml/min (HCC)    DDD (degenerative disc disease), cervical    Diabetes mellitus with complication (HCC)    Type II   Diabetic neuropathy (HCC)    Diabetic retinopathy (HCC)    GERD (gastroesophageal reflux disease)    Glaucoma    Hypertension     Prior to Admission medications   Medication Sig Start Date End Date Taking? Authorizing Provider  acetaminophen  (TYLENOL ) 500 MG tablet Take 2 tablets (1,000 mg total) by mouth in the morning and at bedtime. 11/21/23  Yes Barbarann Nest, MD  albuterol  (VENTOLIN  HFA)  108 (90 Base) MCG/ACT inhaler Inhale 2 puffs into the lungs every 6 (six) hours as needed for wheezing or shortness of breath. 11/21/23  Yes Barbarann Nest, MD  ascorbic acid (VITAMIN C) 500 MG tablet Take 500 mg by mouth daily.   Yes [provider]  aspirin  EC 81 MG tablet Take 1 tablet (81 mg total) by mouth daily. 11/21/23  Yes Barbarann Nest, MD  atorvastatin  (LIPITOR ) 80 MG tablet Take 1 tablet (80 mg total) by mouth at bedtime. 11/21/23  Yes Barbarann Nest, MD  bisacodyl  (DULCOLAX) 10 MG suppository Place 1 suppository (10 mg total) rectally daily as needed for moderate constipation. 11/21/23  Yes Barbarann Nest, MD  Cholecalciferol  (VITAMIN D3) 1.25 MG (50000 UT) CAPS Take 50,000 Units by mouth every 30 (thirty) days.   Yes [provider]  Cyanocobalamin  (VITAMIN B-12 CR) 1000 MCG TBCR Take 1,000 mcg by mouth every 14 (fourteen) days.   Yes [provider]  dextromethorphan -guaiFENesin  (MUCINEX  DM) 30-600 MG 12hr tablet Take 1 tablet by mouth 2 (two) times daily. 11/21/23  Yes Barbarann Nest, MD  dicyclomine  (BENTYL ) 10 MG capsule Take 1 capsule (10 mg total) by mouth in the morning, at noon, in the evening, and at bedtime. 11/21/23  Yes Barbarann Nest, MD  docusate sodium  (COLACE) 100 MG capsule Take 1 capsule (100 mg total) by mouth 2 (two) times  daily. 11/21/23  Yes Barbarann Nest, MD  ferrous sulfate  325 (65 FE) MG tablet Take 1 tablet (325 mg total) by mouth daily with breakfast. 11/21/23  Yes Barbarann Nest, MD  gabapentin  (NEURONTIN ) 100 MG capsule Take 2 capsules (200 mg total) by mouth 3 (three) times daily. Patient taking differently: Take 200 mg by mouth 2 (two) times daily. 11/21/23  Yes Barbarann Nest, MD  insulin  aspart (NOVOLOG ) 100 UNIT/ML injection Inject 2-12 Units into the skin in the morning and at bedtime. Per sliding scale, 201-250= 2 units, 251-300= 4 units, 301-350= 6 units, 351-400= 8 units, 401-450= 10 units, 451-500= 12 units, if greater than 400  call MD 01/06/24  Yes Cheryle Page, MD  isosorbide -hydrALAZINE  (BIDIL ) 20-37.5 MG tablet Take 2 tablets by mouth three times daily. 11/25/23  Yes Patwardhan, Manish J, MD  latanoprost  (XALATAN ) 0.005 % ophthalmic solution Place 1 drop into both eyes at bedtime. 11/21/23  Yes Barbarann Nest, MD  lubiprostone  (AMITIZA ) 24 MCG capsule Take 1 capsule (24 mcg total) by mouth daily. 11/21/23  Yes Barbarann Nest, MD  melatonin 3 MG TABS tablet Take 1 tablet (3 mg total) by mouth at bedtime. 11/21/23  Yes Barbarann Nest, MD  methocarbamol  (ROBAXIN ) 500 MG tablet Take 1 tablet (500 mg total) by mouth every 6 (six) hours as needed for muscle spasms. 11/21/23  Yes Barbarann Nest, MD  metoCLOPramide  (REGLAN ) 5 MG tablet Take 1 tablet (5 mg total) by mouth 3 (three) times daily. 11/21/23 11/20/24 Yes Barbarann Nest, MD  Multiple Vitamins-Minerals (CERTAVITE SENIOR) TABS Take 1 tablet by mouth daily. 11/21/23  Yes Barbarann Nest, MD  nitroGLYCERIN  (NITROSTAT ) 0.4 MG SL tablet Place 1 tablet (0.4 mg total) under the tongue every 5 (five) minutes as needed for chest pain. 11/21/23  Yes Barbarann Nest, MD  nutrition supplement, JUVEN, (JUVEN) PACK Take 1 packet by mouth 2 (two) times daily between meals.   Yes [provider]  ondansetron  (ZOFRAN ) 4 MG tablet Take 1 tablet (4 mg total) by mouth every 6 (six) hours as needed for nausea or vomiting. 11/21/23  Yes Barbarann Nest, MD  OXYGEN Inhale 3 L/min into the lungs continuous.   Yes [provider]  pantoprazole  (PROTONIX ) 40 MG tablet Take 1 tablet (40 mg total) by mouth daily. 11/21/23  Yes Barbarann Nest, MD  polyethylene glycol (MIRALAX  / GLYCOLAX ) 17 g packet Take 17 g by mouth daily as needed for mild constipation or moderate constipation.   Yes [provider]  polyethylene glycol powder (GLYCOLAX /MIRALAX ) 17 GM/SCOOP powder Take 17 g by mouth daily. PRN order: 17 g by mouth daily as needed for constipation Patient taking differently: Take 17 g  by mouth daily. 11/21/23  Yes Barbarann Nest, MD  pyridoxine  (B-6) 100 MG tablet Take 1 tablet (100 mg total) by mouth daily. 11/21/23  Yes Barbarann Nest, MD  sertraline  (ZOLOFT ) 25 MG tablet Take 1 tablet (25 mg total) by mouth daily. 11/21/23  Yes Barbarann Nest, MD  sodium chloride  (OCEAN) 0.65 % SOLN nasal spray Place 1 spray into both nostrils as needed for congestion. Patient taking differently: Place 1 spray into both nostrils daily as needed for congestion. 11/21/23  Yes Barbarann Nest, MD  traMADol  (ULTRAM ) 50 MG tablet Take 1 tablet (50 mg total) by mouth every 12 (twelve) hours as needed for moderate pain (pain score 4-6) or severe pain (pain score 7-10). 01/07/24  Yes Cheryle Page, MD  traZODone  (DESYREL ) 100 MG tablet Take 1 tablet (100 mg total) by mouth  at bedtime. 11/21/23  Yes Barbarann Nest, MD  ZINC OXIDE, TOPICAL, EX Apply 1 application  topically in the morning and at bedtime. Apply to buttocks topically every day and night shift   Yes [provider]    Allergies: Nsaids    Review of Systems  Updated Vital Signs BP (!) 148/80 (BP Location: Left Arm)   Pulse 60   Temp (!) 93 F (33.9 C) (Rectal)   Resp 14   SpO2 98%   Physical Exam Vitals and nursing note reviewed.  Constitutional:      General: He is not in acute distress.    Appearance: He is well-developed. He is not diaphoretic.  HENT:     Head: Normocephalic and atraumatic.  Eyes:     Conjunctiva/sclera: Conjunctivae normal.  Cardiovascular:     Rate and Rhythm: Normal rate and regular rhythm.     Heart sounds: Normal heart sounds. No murmur heard.    No friction rub. No gallop.  Pulmonary:     Effort: Pulmonary effort is normal. No respiratory distress.     Breath sounds: Normal breath sounds. No wheezing or rales.  Abdominal:     General: There is no distension.     Palpations: Abdomen is soft.     Tenderness: There is no abdominal tenderness. There is no guarding.  Musculoskeletal:      Cervical back: Normal range of motion.  Skin:    General: Skin is warm and dry.     Comments: Ulcer to left heel, unstageable Right foot with fifth mtp ulcer, bloody and purulent drainage  Neurological:     Mental Status: He is alert and oriented to person, place, and time.     (all labs ordered are listed, but only abnormal results are displayed) Labs Reviewed  BASIC METABOLIC PANEL WITH GFR - Abnormal; Notable for the following components:      Result Value   CO2 18 (*)    Glucose, Bld 123 (*)    BUN 120 (*)    Creatinine, Ser 5.01 (*)    GFR, Estimated 11 (*)    All other components within normal limits  CBC WITH DIFFERENTIAL/PLATELET - Abnormal; Notable for the following components:   RBC 2.87 (*)    Hemoglobin 9.6 (*)    HCT 31.9 (*)    MCV 111.1 (*)    RDW 25.2 (*)    Lymphs Abs 0.2 (*)    All other components within normal limits  BRAIN NATRIURETIC PEPTIDE - Abnormal; Notable for the following components:   B Natriuretic Peptide 704.6 (*)    All other components within normal limits  SEDIMENTATION RATE - Abnormal; Notable for the following components:   Sed Rate 73 (*)    All other components within normal limits  CBC - Abnormal; Notable for the following components:   RBC 2.89 (*)    Hemoglobin 9.7 (*)    HCT 32.6 (*)    MCV 112.8 (*)    MCHC 29.8 (*)    RDW 25.4 (*)    Platelets 149 (*)    All other components within normal limits  CREATININE, SERUM - Abnormal; Notable for the following components:   Creatinine, Ser 5.17 (*)    GFR, Estimated 11 (*)    All other components within normal limits  IRON  AND TIBC - Abnormal; Notable for the following components:   TIBC 154 (*)    Saturation Ratios 67 (*)    All other components within normal limits  FERRITIN - Abnormal; Notable for the following components:   Ferritin 696 (*)    All other components within normal limits  GLUCOSE, CAPILLARY - Abnormal; Notable for the following components:   Glucose-Capillary  128 (*)    All other components within normal limits  RESP PANEL BY RT-PCR (RSV, FLU A&B, COVID)  [id number]  CULTURE, BLOOD (ROUTINE X 2)  CULTURE, BLOOD (ROUTINE X 2)  LACTIC ACID, PLASMA  CBC  COMPREHENSIVE METABOLIC PANEL WITH GFR  PHOSPHORUS    EKG: EKG Interpretation Date/Time:  Wednesday February 03 2024 10:47:56 EDT Ventricular Rate:  60 PR Interval:  240 QRS Duration:  92 QT Interval:  516 QTC Calculation: 516 R Axis:   89  Text Interpretation: Sinus rhythm with 1st degree A-V block Nonspecific ST and T wave abnormality Prolonged QT Abnormal ECG When compared with ECG of 01-Jan-2024 20:05, No significant change since last tracing Confirmed by Dreama Longs ([id number]) on 02/03/2024 1:53:51 PM  Radiology: MR FOOT RIGHT WO CONTRAST Result Date: 02/03/2024 CLINICAL DATA:  Right foot ulcer.  Concern for osteomyelitis. EXAM: MRI OF THE RIGHT FOREFOOT WITHOUT CONTRAST TECHNIQUE: Multiplanar, multisequence MR imaging of the right forefoot was performed. No intravenous contrast was administered. COMPARISON:  Right foot radiographs dated 05/06/2023. MRI of the right foot dated 02/06/2015. FINDINGS: Bones/Joint/Cartilage Soft tissue ulceration at the lateral plantar forefoot, extends to the level of the underlying fifth MTP joint. Marrow edema with corresponding T1 hypointensity of the fifth proximal phalanx and the fifth metatarsal head and neck with associated areas of cortical indistinctness is compatible with osteomyelitis. No marrow signal abnormality identified elsewhere to suggest osteomyelitis. 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 No significant joint effusion. No fracture or dislocation. Mild degenerative arthropathy of the midfoot. Ligaments Lisfranc ligament is intact. Muscles and Tendons Fatty atrophy of the intrinsic musculature of the foot, likely reflects chronic denervation changes. No significant tenosynovitis. Soft tissue Soft tissue ulceration at the  lateral plantar forefoot at the level of the fifth MTP joint with surrounding soft tissue thickening and edema. No loculated fluid collection. Postoperative changes related to prior amputations of the first and second toes. IMPRESSION: 1. Osteomyelitis of the fifth proximal phalanx and the fifth metatarsal head and neck with overlying soft tissue ulceration at the lateral plantar forefoot. No loculated fluid collection. 2. Postoperative changes related to amputation of the first and second toes. 3. Chronic denervation changes of the intrinsic foot musculature. Electronically Signed   By: Harrietta Sherry M.D.   On: 02/03/2024 17:08   DG Chest 2 View Result Date: 02/03/2024 CLINICAL DATA:  Shortness of breath EXAM: CHEST - 2 VIEW COMPARISON:  11/01/2023 FINDINGS: Similar low lung volumes with cardiomegaly, vascular congestion, and basilar opacities favored to be atelectasis. Suspect small bilateral pleural effusions. No pneumothorax. Trachea midline. Degenerative changes of the spine. Very limited lateral view because of technique. IMPRESSION: 1. Cardiomegaly with vascular congestion and basilar atelectasis. 2. Suspect small bilateral pleural effusions. Electronically Signed   By: CHRISTELLA.  Shick M.D.   On: 02/03/2024 10:56     Procedures   Medications Ordered in the ED  aspirin  EC tablet 81 mg (has no administration in time range)  traMADol  (ULTRAM ) tablet 50 mg (has no administration in time range)  atorvastatin  (LIPITOR ) tablet 80 mg (has no administration in time range)  nitroGLYCERIN  (NITROSTAT ) SL tablet 0.4 mg (has no administration in time range)  sertraline  (ZOLOFT ) tablet 25 mg (has no administration in time range)  traZODone  (DESYREL ) tablet  100 mg (has no administration in time range)  bisacodyl  (DULCOLAX) suppository 10 mg (has no administration in time range)  dicyclomine  (BENTYL ) capsule 10 mg (has no administration in time range)  docusate sodium  (COLACE) capsule 100 mg (has no  administration in time range)  latanoprost  (XALATAN ) 0.005 % ophthalmic solution 1 drop (has no administration in time range)  furosemide  (LASIX ) injection 40 mg (has no administration in time range)  heparin  injection 5,000 Units (has no administration in time range)  sodium chloride  flush (NS) 0.9 % injection 3 mL (has no administration in time range)  sodium chloride  flush (NS) 0.9 % injection 3 mL (has no administration in time range)  0.9 %  sodium chloride  infusion (has no administration in time range)  acetaminophen  (TYLENOL ) tablet 650 mg (has no administration in time range)    Or  acetaminophen  (TYLENOL ) suppository 650 mg (has no administration in time range)  ondansetron  (ZOFRAN ) tablet 4 mg ( Oral See Alternative 02/03/24 2028)    Or  ondansetron  (ZOFRAN ) injection 4 mg (4 mg Intravenous Given 02/03/24 2028)  insulin  aspart (novoLOG ) injection 0-9 Units (has no administration in time range)  vancomycin  (VANCOREADY) IVPB 1750 mg/350 mL (has no administration in time range)  piperacillin -tazobactam (ZOSYN ) IVPB 2.25 g (has no administration in time range)  vancomycin  variable dose per unstable renal function (pharmacist dosing) (has no administration in time range)  ipratropium-albuterol  (DUONEB) 0.5-2.5 (3) MG/3ML nebulizer solution 3 mL (3 mLs Nebulization Given 02/03/24 1423)  furosemide  (LASIX ) injection 40 mg (40 mg Intravenous Given 02/03/24 1422)                                      78 year old male with a history of CHF with reduced ejection fraction of 30 to 35%, chronic microcytic anemia, hypertension, CKD stage IV, coronary artery disease status post cardiac stents, insulin -dependent diabetes type 2, chronic diabetic foot ulcers, recent admission with concern for AKI on CKD, failure to thrive who presents from skilled nursing facility with concern for shortness of breath.  Differential diagnosis for dyspnea includes ACS, PE, COPD exacerbation, CHF exacerbation, anemia,  pneumonia, viral etiology such as COVID 19 infection, metabolic abnormality.    Labs are completed and personally evaluated interpreted by me are significant for creatinine of 5.01 from most recent 3.06 at time of discharge on 20 August, previous creatinine of 2.54 in July.  His CBC shows no leukocytosis, stable anemia. His BNP is increased from prior values with number of 704 from previous of 202 in December 2024, 650 in June.  His COVID, flu and RSV test were negative.  Chest x-ray was completed and evaluated by me and radiology shows cardiomegaly  Given no fever, no leukocytosis, no sign of pneumonia on CXR do not suspect pneumonia at this time and suspect symptoms are likely secondary to volume overload and worsening renal failure.  Clinically, have lower suspicion for PE given gradual onset of symptoms, no asymmetric leg swelling, presence of cough and worsening renal failure.   Given concern for purulence from right foot wound, will order MR foot. Ordered blood cx/lactic acid.   Nephrology, Dr. Jerrye consulted. Admitted for further care of acute on chronic renal failure, dyspnea.      Final diagnoses:  Acute renal failure, unspecified acute renal failure type (HCC)  Shortness of breath  Other acute osteomyelitis, unspecified site [facility])    ED Discharge Orders  None          Dreama Longs, MD 02/03/24 2145

## 2024-02-03 NOTE — Consult Note (Signed)
 [facility] Renal Consultation Note  Requesting MD: Sabas Brod, MD Indication for Consultation:  AKI on CKD   Chief complaint: shortness of breath   HPI:  David [name] is a 78 y.o. male with a history of chronic kidney disease, coronary artery disease, chronic combined systolic and diastolic congestive heart failure, type 2 diabetes mellitus and hypertension who presented to the hospital from his SNF with worsening shortness of breath.  His baseline creatinine per review of labs below is approximately 2.5-3.  Nephrology is consulted for assistance with management of AKI.  The ER ordered lasix  40 mg IV once.  There is no urine output recorded.  He does not appear to be on any diuretics as an outpatient per his med rec.  Note that on chart review, palliative care has seen him on a prior admission and had recommended outpatient palliative support.  Nephrology has seen him during prior admission (at least in 08/2023).  He had labs with Dr. Gearline at [facility] in 08/2019 but no more recent labs are available in [facility] suggesting he was lost to follow-up. He does not recall.  He doesn't recall seeing palliative care previously.  Breathing is better than when he came in.  His abdomen feels a little distended to him.  He doesn't know where he would usually build up fluid.  He's on oxygen at the SNF.  I asked what happened to his foot and he states diabetes.  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      Creatinine, Ser  Date/Time Value Ref Range Status  02/03/2024 11:13 AM 5.01 (H) 0.61 - 1.24 mg/dL Final  91/79/7974 97:50 AM 3.06 (H) 0.61 - 1.24 mg/dL Final  91/81/7974 97:49 AM 2.29 (H) 0.61 - 1.24 mg/dL Final  91/83/7974 98:89 AM 2.99 (H) 0.61 - 1.24 mg/dL Final  91/84/7974 92:52 PM 3.11 (H) 0.61 - 1.24 mg/dL Final  92/96/7974 90:81 AM 2.54 (H) 0.61 - 1.24 mg/dL Final  93/70/7974 93:56 AM 2.42 (H) 0.61 - 1.24 mg/dL Final  93/74/7974 95:73 AM 2.50 (H)  0.61 - 1.24 mg/dL Final  93/75/7974 90:96 AM 2.14 (H) 0.61 - 1.24 mg/dL Final  93/76/7974 96:55 AM 2.44 (H) 0.61 - 1.24 mg/dL Final  93/77/7974 96:42 AM 2.39 (H) 0.61 - 1.24 mg/dL Final  93/78/7974 95:80 AM 2.67 (H) 0.61 - 1.24 mg/dL Final  93/79/7974 90:48 AM 2.91 (H) 0.61 - 1.24 mg/dL Final  93/80/7974 91:56 AM 3.47 (H) 0.61 - 1.24 mg/dL Final  93/81/7974 95:80 AM 3.58 (H) 0.61 - 1.24 mg/dL Final  93/82/7974 95:59 AM 3.42 (H) 0.61 - 1.24 mg/dL Final  93/83/7974 96:57 AM 3.18 (H) 0.61 - 1.24 mg/dL Final  93/84/7974 87:73 AM 2.63 (H) 0.61 - 1.24 mg/dL Final  93/86/7974 94:92 AM 2.06 (H) 0.61 - 1.24 mg/dL Final  93/86/7974 97:91 AM 2.02 (H) 0.61 - 1.24 mg/dL Final  93/87/7974 94:53 PM 1.97 (H) 0.61 - 1.24 mg/dL Final  95/77/7974 96:43 AM 2.49 (H) 0.61 - 1.24 mg/dL Final  95/78/7974 95:90 AM 2.61 (H) 0.61 - 1.24 mg/dL Final  95/79/7974 95:95 AM 3.06 (H) 0.61 - 1.24 mg/dL Final  95/80/7974 96:41 AM 3.25 (H) 0.61 - 1.24 mg/dL Final  95/81/7974 91:44 AM 3.60 (H) 0.61 - 1.24 mg/dL Final  95/81/7974 91:94 AM 2.71 (H) 0.61 - 1.24 mg/dL Final  98/88/7974 94:51 PM 2.11 (H) 0.61 - 1.24 mg/dL Final  98/93/7974 97:71 AM 2.59 (H) 0.61 - 1.24 mg/dL Final  98/94/7974  02:33 AM 2.59 (H) 0.61 - 1.24 mg/dL Final  98/95/7974 97:74 AM 2.74 (H) 0.61 - 1.24 mg/dL Final  98/96/7974 96:87 AM 2.72 (H) 0.61 - 1.24 mg/dL Final  98/97/7974 97:59 AM 2.17 (H) 0.61 - 1.24 mg/dL Final  98/98/7974 97:48 AM 2.41 (H) 0.61 - 1.24 mg/dL Final  87/68/7975 96:95 AM 2.36 (H) 0.61 - 1.24 mg/dL Final  87/69/7975 97:53 AM 2.32 (H) 0.61 - 1.24 mg/dL Final  87/70/7975 97:50 PM 2.43 (H) 0.61 - 1.24 mg/dL Final  87/71/7975 97:70 AM 2.86 (H) 0.61 - 1.24 mg/dL Final  87/72/7975 91:79 AM 3.17 (H) 0.61 - 1.24 mg/dL Final  87/73/7975 96:96 AM 2.61 (H) 0.61 - 1.24 mg/dL Final  87/74/7975 97:63 AM 2.58 (H) 0.61 - 1.24 mg/dL Final  87/75/7975 95:80 AM 2.88 (H) 0.61 - 1.24 mg/dL Final  87/76/7975 88:89 PM 2.85 (H) 0.61 - 1.24 mg/dL Final   87/76/7975 97:76 AM 2.87 (H) 0.61 - 1.24 mg/dL Final  87/79/7975 97:59 AM 2.82 (H) 0.61 - 1.24 mg/dL Final  87/81/7975 97:48 AM 2.45 (H) 0.61 - 1.24 mg/dL Final  87/82/7975 92:67 AM 1.93 (H) 0.61 - 1.24 mg/dL Final  87/83/7975 91:54 PM 2.20 (H) 0.61 - 1.24 mg/dL Final  97/84/7975 87:84 AM 2.86 (H) 0.61 - 1.24 mg/dL Final  97/85/7975 92:42 AM 2.32 (H) 0.61 - 1.24 mg/dL Final  97/86/7975 96:55 PM 2.53 (H) 0.61 - 1.24 mg/dL Final  97/87/7975 87:74 AM 2.09 (H) 0.61 - 1.24 mg/dL Final     PMHx:   Past Medical History:  Diagnosis Date   Anemia    Arthritis    CAD in native artery    s/p stent in 11/18   CHF (congestive heart failure) (HCC)    normal echo in 11/18   CKD (chronic kidney disease) stage 3, GFR 30-59 ml/min (HCC)    DDD (degenerative disc disease), cervical    Diabetes mellitus with complication (HCC)    Type II   Diabetic neuropathy (HCC)    Diabetic retinopathy (HCC)    GERD (gastroesophageal reflux disease)    Glaucoma    Hypertension     Past Surgical History:  Procedure Laterality Date   AMPUTATION Right 07/09/2016   Procedure: Right Great Toe Amputation at Metatarsophalangeal Joint;  Surgeon: Jerona Harden GAILS, MD;  Location: [facility] OR;  Service: Orthopedics;  Laterality: Right;   AMPUTATION Right 05/28/2018   Procedure: RIGHT SECOND TOE AMPUTATION;  Surgeon: Harden Jerona GAILS, MD;  Location: [facility] OR;  Service: Orthopedics;  Laterality: Right;   AMPUTATION Left 08/03/2020   Procedure: LEFT 5TH RAY AMPUTATION;  Surgeon: Harden Jerona GAILS, MD;  Location: [facility] OR;  Service: Orthopedics;  Laterality: Left;   BACK SURGERY     4   CARDIAC CATHETERIZATION     CORONARY STENT INTERVENTION N/A 04/06/2017   Procedure: CORONARY STENT INTERVENTION;  Surgeon: Elmira Newman PARAS, MD;  Location: [facility];  Service: Cardiovascular;  Laterality: N/A;   CORONARY STENT INTERVENTION N/A 04/07/2017   Procedure: CORONARY STENT INTERVENTION;  Surgeon: Elmira Newman PARAS, MD;  Location:  [facility];  Service: Cardiovascular;  Laterality: N/A;   CYST EXCISION     on Back   ENDOTRACHEAL INTUBATION EMERGENT  08/07/2018       ESOPHAGOGASTRODUODENOSCOPY Left 11/07/2023   Procedure: EGD (ESOPHAGOGASTRODUODENOSCOPY);  Surgeon: Rosalie Kitchens, MD;  Location: [facility];  Service: Gastroenterology;  Laterality: Left;   JOINT REPLACEMENT     LAPAROSCOPIC ABDOMINAL EXPLORATION N/A 11/26/2017   Procedure: LAPAROSCOPIC ABDOMINAL EXPLORATION,  DRAINAGE OF APPENDICEAL ABCESS. PLACEMENT OF DRAIN;  Surgeon: Ethyl Lenis, MD;  Location: [facility] OR;  Service: General;  Laterality: N/A;   RIGHT/LEFT HEART CATH AND CORONARY ANGIOGRAPHY N/A 04/06/2017   Procedure: RIGHT/LEFT HEART CATH AND CORONARY ANGIOGRAPHY;  Surgeon: Elmira Newman PARAS, MD;  Location: [facility];  Service: Cardiovascular;  Laterality: N/A;   TOE SURGERY  05/2018   Left    TOTAL HIP ARTHROPLASTY     Right     Family Hx:  Family History  Problem Relation Age of Onset   Diabetes Other    Hyperlipidemia Other    Hypertension Other    Stroke Other    Alzheimer's disease Other    Thyroid  disease Mother    Diabetes Mellitus II Father    Alzheimer's disease Father     Social History:  reports that he quit smoking about 29 years ago. His smoking use included cigarettes. He started smoking about 34 years ago. He has a 1.3 pack-year smoking history. He has been exposed to tobacco smoke. He has never used smokeless tobacco. He reports that he does not currently use alcohol. He reports that he does not use drugs.  Allergies:  Allergies  Allergen Reactions   Nsaids Other (See Comments)    CKD stage 3     Medications: Prior to Admission medications   Medication Sig Start Date End Date Taking? Authorizing Provider  acetaminophen  (TYLENOL ) 500 MG tablet Take 2 tablets (1,000 mg total) by mouth in the morning and at bedtime. 11/21/23  Yes Barbarann Nest, MD  albuterol  (VENTOLIN  HFA) 108 (90 Base) MCG/ACT inhaler  Inhale 2 puffs into the lungs every 6 (six) hours as needed for wheezing or shortness of breath. 11/21/23  Yes Barbarann Nest, MD  ascorbic acid (VITAMIN C) 500 MG tablet Take 500 mg by mouth daily.   Yes [provider]  aspirin  EC 81 MG tablet Take 1 tablet (81 mg total) by mouth daily. 11/21/23  Yes Barbarann Nest, MD  atorvastatin  (LIPITOR ) 80 MG tablet Take 1 tablet (80 mg total) by mouth at bedtime. 11/21/23  Yes Barbarann Nest, MD  bisacodyl  (DULCOLAX) 10 MG suppository Place 1 suppository (10 mg total) rectally daily as needed for moderate constipation. 11/21/23  Yes Barbarann Nest, MD  Cholecalciferol  (VITAMIN D3) 1.25 MG (50000 UT) CAPS Take 50,000 Units by mouth every 30 (thirty) days.   Yes [provider]  Cyanocobalamin  (VITAMIN B-12 CR) 1000 MCG TBCR Take 1,000 mcg by mouth every 14 (fourteen) days.   Yes [provider]  dextromethorphan -guaiFENesin  (MUCINEX  DM) 30-600 MG 12hr tablet Take 1 tablet by mouth 2 (two) times daily. 11/21/23  Yes Barbarann Nest, MD  dicyclomine  (BENTYL ) 10 MG capsule Take 1 capsule (10 mg total) by mouth in the morning, at noon, in the evening, and at bedtime. 11/21/23  Yes Barbarann Nest, MD  docusate sodium  (COLACE) 100 MG capsule Take 1 capsule (100 mg total) by mouth 2 (two) times daily. 11/21/23  Yes Barbarann Nest, MD  ferrous sulfate  325 (65 FE) MG tablet Take 1 tablet (325 mg total) by mouth daily with breakfast. 11/21/23  Yes Barbarann Nest, MD  gabapentin  (NEURONTIN ) 100 MG capsule Take 2 capsules (200 mg total) by mouth 3 (three) times daily. Patient taking differently: Take 200 mg by mouth 2 (two) times daily. 11/21/23  Yes Barbarann Nest, MD  insulin  aspart (NOVOLOG ) 100 UNIT/ML injection Inject 2-12 Units into the skin in the morning and at bedtime. Per sliding scale, 201-250=  2 units, 251-300= 4 units, 301-350= 6 units, 351-400= 8 units, 401-450= 10 units, 451-500= 12 units, if greater than 400 call MD 01/06/24  Yes Cheryle Page, MD  isosorbide -hydrALAZINE  (BIDIL ) 20-37.5 MG tablet Take 2 tablets by mouth three times daily. 11/25/23  Yes Patwardhan, Manish J, MD  latanoprost  (XALATAN ) 0.005 % ophthalmic solution Place 1 drop into both eyes at bedtime. 11/21/23  Yes Barbarann Nest, MD  lubiprostone  (AMITIZA ) 24 MCG capsule Take 1 capsule (24 mcg total) by mouth daily. 11/21/23  Yes Barbarann Nest, MD  melatonin 3 MG TABS tablet Take 1 tablet (3 mg total) by mouth at bedtime. 11/21/23  Yes Barbarann Nest, MD  methocarbamol  (ROBAXIN ) 500 MG tablet Take 1 tablet (500 mg total) by mouth every 6 (six) hours as needed for muscle spasms. 11/21/23  Yes Barbarann Nest, MD  metoCLOPramide  (REGLAN ) 5 MG tablet Take 1 tablet (5 mg total) by mouth 3 (three) times daily. 11/21/23 11/20/24 Yes Barbarann Nest, MD  Multiple Vitamins-Minerals (CERTAVITE SENIOR) TABS Take 1 tablet by mouth daily. 11/21/23  Yes Barbarann Nest, MD  nitroGLYCERIN  (NITROSTAT ) 0.4 MG SL tablet Place 1 tablet (0.4 mg total) under the tongue every 5 (five) minutes as needed for chest pain. 11/21/23  Yes Barbarann Nest, MD  nutrition supplement, JUVEN, (JUVEN) PACK Take 1 packet by mouth 2 (two) times daily between meals.   Yes [provider]  ondansetron  (ZOFRAN ) 4 MG tablet Take 1 tablet (4 mg total) by mouth every 6 (six) hours as needed for nausea or vomiting. 11/21/23  Yes Barbarann Nest, MD  OXYGEN Inhale 3 L/min into the lungs continuous.   Yes [provider]  pantoprazole  (PROTONIX ) 40 MG tablet Take 1 tablet (40 mg total) by mouth daily. 11/21/23  Yes Barbarann Nest, MD  polyethylene glycol (MIRALAX  / GLYCOLAX ) 17 g packet Take 17 g by mouth daily as needed for mild constipation or moderate constipation.   Yes [provider]  polyethylene glycol powder (GLYCOLAX /MIRALAX ) 17 GM/SCOOP powder Take 17 g by mouth daily. PRN order: 17 g by mouth daily as needed for constipation Patient taking differently: Take 17 g by mouth daily. 11/21/23  Yes  Barbarann Nest, MD  pyridoxine  (B-6) 100 MG tablet Take 1 tablet (100 mg total) by mouth daily. 11/21/23  Yes Barbarann Nest, MD  sertraline  (ZOLOFT ) 25 MG tablet Take 1 tablet (25 mg total) by mouth daily. 11/21/23  Yes Barbarann Nest, MD  sodium chloride  (OCEAN) 0.65 % SOLN nasal spray Place 1 spray into both nostrils as needed for congestion. Patient taking differently: Place 1 spray into both nostrils daily as needed for congestion. 11/21/23  Yes Barbarann Nest, MD  traMADol  (ULTRAM ) 50 MG tablet Take 1 tablet (50 mg total) by mouth every 12 (twelve) hours as needed for moderate pain (pain score 4-6) or severe pain (pain score 7-10). 01/07/24  Yes Cheryle Page, MD  traZODone  (DESYREL ) 100 MG tablet Take 1 tablet (100 mg total) by mouth at bedtime. 11/21/23  Yes Barbarann Nest, MD  ZINC OXIDE, TOPICAL, EX Apply 1 application  topically in the morning and at bedtime. Apply to buttocks topically every day and night shift   Yes [provider]   I have reviewed the patient's current and reported prior to admission medications.   Labs:     Latest Ref Rng & Units 02/03/2024   11:13 AM 01/06/2024    2:49 AM 01/04/2024    2:50 AM  BMP  Glucose 70 - 99 mg/dL 876  158  158   BUN 8 - 23 mg/dL 879  43  36   Creatinine 0.61 - 1.24 mg/dL 4.98  6.93  7.70   Sodium 135 - 145 mmol/L 140  134  139   Potassium 3.5 - 5.1 mmol/L 4.9  5.2  4.6   Chloride 98 - 111 mmol/L 110  106  110   CO2 22 - 32 mmol/L 18  22  24    Calcium  8.9 - 10.3 mg/dL 9.1  8.1  7.9     Urinalysis    Component Value Date/Time   COLORURINE YELLOW 01/02/2024 0110   APPEARANCEUR CLEAR 01/02/2024 0110   LABSPEC 1.014 01/02/2024 0110   PHURINE 5.0 01/02/2024 0110   GLUCOSEU NEGATIVE 01/02/2024 0110   HGBUR NEGATIVE 01/02/2024 0110   BILIRUBINUR NEGATIVE 01/02/2024 0110   KETONESUR NEGATIVE 01/02/2024 0110   PROTEINUR 30 (A) 01/02/2024 0110   UROBILINOGEN 1.0 02/13/2015 1934   NITRITE NEGATIVE 01/02/2024 0110    LEUKOCYTESUR NEGATIVE 01/02/2024 0110     ROS:  Pertinent items noted in HPI and remainder of comprehensive ROS otherwise negative.  Physical Exam: Vitals:   02/03/24 1800 02/03/24 1833  BP: 126/69   Pulse: (!) 58   Resp: 12   Temp: (!) 92.3 F (33.5 C) (!) 93.3 F (34.1 C)  SpO2: 100%      General:  adult male in bed in NAD; lying near flat  HEENT: NCAT Eyes: EOMI sclera anicteric Neck: supple trachea midline  Heart: S1S2 no rub Lungs: clear to auscultation; normal work of breathing on 2.5 liters oxygen  Abdomen: soft/distended/nontender Extremities: no edema appreciated; feet are being wrapped Neuro: gross visual deficit, right eye; alert and oriented x 3 GU: has a male purewick on Psych normal mood and affect Skin: nursing is assessing and wrapping his feet- they report he has unstageable heel ulcer on one foot, beefy red ulcer on right foot - please refer to their notes as well   Assessment/Plan:   # AKI  - May be cardiorenal vs CKD progression.  Setting of multiple leg wounds, as well  - Note pharmacy is consulted for dosing of vanc and zosyn    -  Continue supportive care and follow trends   # CKD stage IV  - note baseline Cr is 2.5 - 3.0  - Secondary to diabetic nephropathy and microvascular disease from HTN     # Chronic combined systolic and diastolic congestive heart failure - Continue with lasix  as ordered for now    # HTN  - Acceptable on current regimen   # Metabolic acidosis - Setting of AKI - Follow with lasix  for now   # Anemia of CKD - Macrocytic anemia  - Concern for nutrient deficiency - update iron  panel as well as likely functional iron  deficiency  - B12 and folate ok this summer  Thank you for the consult.  Please do not hesitate to contact me with any questions regarding our patient   Katheryn JAYSON Saba 02/03/2024, 8:16 PM

## 2024-02-03 NOTE — ED Notes (Signed)
 Bair hugger initiated per MD [place]

## 2024-02-03 NOTE — ED Triage Notes (Signed)
 Pt here from [facility] , pt refused meds at nursing home and demanded to come to the hospital

## 2024-02-03 NOTE — H&P (Signed)
 [facility] H&P    Patient Demographics:    David [name], is a 78 y.o. male  MRN: [id number]  DOB - 1946-05-02  Admit Date - 02/03/2024  Referring MD/NP/PA: Rocky Massy  Outpatient Primary MD for the patient is Pcp, No  Patient coming from: Skilled nursing facility  Chief complaint-shortness of breath   HPI:    David [name]  is a 78 y.o. male, with medical history of combined systolic and diastolic CHF with reduced EF of 30 to 35%, essential hypertension, CKD stage IV, chronic microcytic anemia, CAD s/p coronary stents, diabetes mellitus type 2, chronic diabetic foot ulcers in bilateral lower extremities who was brought to the hospital from skilled facility for worsening shortness of breath for past 2 weeks.  Patient uses 3 L/min of oxygen at home which is his baseline.  And has noted worsening shortness of breath. Denies fever or chills Denies coughing up any phlegm Denies chest pain Denies nausea vomiting or diarrhea Denies dysuria Has noticed reduced urination over past few days  In the ED, chest x-ray showed pulmonary edema, BNP elevated 704, creatinine elevated at 5.01 Baseline creatinine around 3.06 as of 01/06/2024. Patient also found to have bleeding ulcer in right foot. Patient was given Lasix  40 mg IV by the ED provider   Review of systems:    In addition to the HPI above,    All other systems reviewed and are negative.    Past History of the following :    Past Medical History:  Diagnosis Date   Anemia    Arthritis    CAD in native artery    s/p stent in 11/18   CHF (congestive heart failure) (HCC)    normal echo in 11/18   CKD (chronic kidney disease) stage 3, GFR 30-59 ml/min (HCC)    DDD (degenerative disc disease), cervical    Diabetes mellitus with complication (HCC)    Type II   Diabetic neuropathy (HCC)    Diabetic retinopathy (HCC)    GERD (gastroesophageal reflux  disease)    Glaucoma    Hypertension       Past Surgical History:  Procedure Laterality Date   AMPUTATION Right 07/09/2016   Procedure: Right Great Toe Amputation at Metatarsophalangeal Joint;  Surgeon: Jerona Harden GAILS, MD;  Location: [facility] OR;  Service: Orthopedics;  Laterality: Right;   AMPUTATION Right 05/28/2018   Procedure: RIGHT SECOND TOE AMPUTATION;  Surgeon: Harden Jerona GAILS, MD;  Location: [facility] OR;  Service: Orthopedics;  Laterality: Right;   AMPUTATION Left 08/03/2020   Procedure: LEFT 5TH RAY AMPUTATION;  Surgeon: Harden Jerona GAILS, MD;  Location: [facility] OR;  Service: Orthopedics;  Laterality: Left;   BACK SURGERY     4   CARDIAC CATHETERIZATION     CORONARY STENT INTERVENTION N/A 04/06/2017   Procedure: CORONARY STENT INTERVENTION;  Surgeon: Elmira Newman PARAS, MD;  Location: [facility];  Service: Cardiovascular;  Laterality: N/A;   CORONARY STENT INTERVENTION N/A 04/07/2017   Procedure: CORONARY STENT INTERVENTION;  Surgeon: Elmira Newman PARAS, MD;  Location:  [facility];  Service: Cardiovascular;  Laterality: N/A;   CYST EXCISION     on Back   ENDOTRACHEAL INTUBATION EMERGENT  08/07/2018       ESOPHAGOGASTRODUODENOSCOPY Left 11/07/2023   Procedure: EGD (ESOPHAGOGASTRODUODENOSCOPY);  Surgeon: Rosalie Kitchens, MD;  Location: [facility];  Service: Gastroenterology;  Laterality: Left;   JOINT REPLACEMENT     LAPAROSCOPIC ABDOMINAL EXPLORATION N/A 11/26/2017   Procedure: LAPAROSCOPIC ABDOMINAL EXPLORATION, DRAINAGE OF APPENDICEAL ABCESS. PLACEMENT OF DRAIN;  Surgeon: Ethyl Lenis, MD;  Location: [facility] OR;  Service: General;  Laterality: N/A;   RIGHT/LEFT HEART CATH AND CORONARY ANGIOGRAPHY N/A 04/06/2017   Procedure: RIGHT/LEFT HEART CATH AND CORONARY ANGIOGRAPHY;  Surgeon: Elmira Newman PARAS, MD;  Location: [facility];  Service: Cardiovascular;  Laterality: N/A;   TOE SURGERY  05/2018   Left    TOTAL HIP ARTHROPLASTY     Right       Social History:      Social  History   Tobacco Use   Smoking status: Former    Current packs/day: 0.00    Average packs/day: 0.3 packs/day for 5.0 years (1.3 ttl pk-yrs)    Types: Cigarettes    Start date: 29    Quit date: 1996    Years since quitting: 29.7    Passive exposure: Past   Smokeless tobacco: Never  Substance Use Topics   Alcohol use: Not Currently    Comment: occ       Family History :     Family History  Problem Relation Age of Onset   Diabetes Other    Hyperlipidemia Other    Hypertension Other    Stroke Other    Alzheimer's disease Other    Thyroid  disease Mother    Diabetes Mellitus II Father    Alzheimer's disease Father       Home Medications:   Prior to Admission medications   Medication Sig Start Date End Date Taking? Authorizing Provider  acetaminophen  (TYLENOL ) 500 MG tablet Take 2 tablets (1,000 mg total) by mouth in the morning and at bedtime. Patient taking differently: Take 1,000 mg by mouth 3 (three) times daily. 11/21/23   Barbarann Nest, MD  albuterol  (VENTOLIN  HFA) 108 (90 Base) MCG/ACT inhaler Inhale 2 puffs into the lungs every 6 (six) hours as needed for wheezing or shortness of breath. 11/21/23   Barbarann Nest, MD  ascorbic acid (VITAMIN C) 500 MG tablet Take 500 mg by mouth every evening.    [provider]  aspirin  EC 81 MG tablet Take 1 tablet (81 mg total) by mouth daily. 11/21/23   Barbarann Nest, MD  atorvastatin  (LIPITOR ) 80 MG tablet Take 1 tablet (80 mg total) by mouth at bedtime. 11/21/23   Barbarann Nest, MD  bisacodyl  (DULCOLAX) 10 MG suppository Place 1 suppository (10 mg total) rectally daily as needed for moderate constipation. 11/21/23   Barbarann Nest, MD  Cholecalciferol  (VITAMIN D3) 1.25 MG (50000 UT) CAPS Take 50,000 Units by mouth every 30 (thirty) days.    [provider]  Cyanocobalamin  (VITAMIN B-12 CR) 1000 MCG TBCR Take 1,000 mcg by mouth every Sunday.    [provider]  dextromethorphan -guaiFENesin  (MUCINEX  DM)  30-600 MG 12hr tablet Take 1 tablet by mouth 2 (two) times daily. 11/21/23   Barbarann Nest, MD  dicyclomine  (BENTYL ) 10 MG capsule Take 1 capsule (10 mg total) by mouth in the morning, at noon, in the evening, and at bedtime. 11/21/23   Barbarann Nest, MD  docusate  sodium (COLACE) 100 MG capsule Take 1 capsule (100 mg total) by mouth 2 (two) times daily. 11/21/23   Barbarann Nest, MD  ferrous sulfate  325 (65 FE) MG tablet Take 1 tablet (325 mg total) by mouth daily with breakfast. 11/21/23   Barbarann Nest, MD  gabapentin  (NEURONTIN ) 100 MG capsule Take 2 capsules (200 mg total) by mouth 3 (three) times daily. 11/21/23   Barbarann Nest, MD  insulin  aspart (NOVOLOG ) 100 UNIT/ML injection Inject 2-12 Units into the skin in the morning and at bedtime. Per sliding scale, 201-250= 2 units, 251-300= 4 units, 301-350= 6 units, 351-400= 8 units, 401-450= 10 units, 451-500= 12 units, if greater than 400 call MD 01/06/24   Cheryle Page, MD  isosorbide -hydrALAZINE  (BIDIL ) 20-37.5 MG tablet Take 2 tablets by mouth three times daily. 11/25/23   Patwardhan, Manish J, MD  latanoprost  (XALATAN ) 0.005 % ophthalmic solution Place 1 drop into both eyes at bedtime. 11/21/23   Barbarann Nest, MD  lubiprostone  (AMITIZA ) 24 MCG capsule Take 1 capsule (24 mcg total) by mouth daily. 11/21/23   Barbarann Nest, MD  melatonin 3 MG TABS tablet Take 1 tablet (3 mg total) by mouth at bedtime. 11/21/23   Barbarann Nest, MD  methocarbamol  (ROBAXIN ) 500 MG tablet Take 1 tablet (500 mg total) by mouth every 6 (six) hours as needed for muscle spasms. 11/21/23   Barbarann Nest, MD  metoCLOPramide  (REGLAN ) 5 MG tablet Take 1 tablet (5 mg total) by mouth 3 (three) times daily. 11/21/23 11/20/24  Barbarann Nest, MD  Multiple Vitamins-Minerals (CERTAVITE SENIOR) TABS Take 1 tablet by mouth daily. 11/21/23   Barbarann Nest, MD  nitroGLYCERIN  (NITROSTAT ) 0.4 MG SL tablet Place 1 tablet (0.4 mg total) under the tongue every 5 (five) minutes as needed for  chest pain. 11/21/23   Barbarann Nest, MD  nutrition supplement, JUVEN, RAWLEIGH) PACK Take 1 packet by mouth 2 (two) times daily between meals.    [provider]  ondansetron  (ZOFRAN ) 4 MG tablet Take 1 tablet (4 mg total) by mouth every 6 (six) hours as needed for nausea or vomiting. 11/21/23   Barbarann Nest, MD  OXYGEN Inhale 3 L/min into the lungs continuous.    [provider]  pantoprazole  (PROTONIX ) 40 MG tablet Take 1 tablet (40 mg total) by mouth daily. 11/21/23   Barbarann Nest, MD  polyethylene glycol powder (GLYCOLAX /MIRALAX ) 17 GM/SCOOP powder Take 17 g by mouth daily. PRN order: 17 g by mouth daily as needed for constipation 11/21/23   Barbarann Nest, MD  pyridoxine  (B-6) 100 MG tablet Take 1 tablet (100 mg total) by mouth daily. 11/21/23   Barbarann Nest, MD  sertraline  (ZOLOFT ) 25 MG tablet Take 1 tablet (25 mg total) by mouth daily. 11/21/23   Barbarann Nest, MD  sodium chloride  (OCEAN) 0.65 % SOLN nasal spray Place 1 spray into both nostrils as needed for congestion. 11/21/23   Barbarann Nest, MD  traMADol  (ULTRAM ) 50 MG tablet Take 1 tablet (50 mg total) by mouth every 12 (twelve) hours as needed for moderate pain (pain score 4-6) or severe pain (pain score 7-10). 01/07/24   Cheryle Page, MD  traZODone  (DESYREL ) 100 MG tablet Take 1 tablet (100 mg total) by mouth at bedtime. 11/21/23   Barbarann Nest, MD  ZINC OXIDE, TOPICAL, EX Apply 1 application  topically in the morning and at bedtime. Apply to buttocks topically every day and night shift    [provider]     Allergies:     Allergies  Allergen Reactions   Nsaids Other (See Comments)    CKD stage 3      Physical Exam:   Vitals  Blood pressure (!) 140/83, pulse (!) 58, temperature (!) 97.5 F (36.4 C), resp. rate 16, SpO2 98%.  1.  General: Appears in no acute distress  2. Psychiatric: Alert, oriented x 3, intact insight and judgment  3. Neurologic: Cranial nerves II through XII grossly  intact, no focal deficit noted  4. HEENMT:  Atraumatic normocephalic, extraocular's are intact  5. Respiratory : Lungs clear to auscultation bilaterally  6. Cardiovascular : S1-S2, regular, no murmur auscultated no JVD, peripheral pulses palpable bilaterally with Dopplers  7. Gastrointestinal:  Abdomen is soft, nontender, no organomegaly      Data Review:    CBC Recent Labs  Lab 02/03/24 1113  WBC 7.7  HGB 9.6*  HCT 31.9*  PLT 155  MCV 111.1*  MCH 33.4  MCHC 30.1  RDW 25.2*  LYMPHSABS 0.2*  MONOABS 0.7  EOSABS 0.0  BASOSABS 0.0   ------------------------------------------------------------------------------------------------------------------  Results for orders placed or performed during the hospital encounter of 02/03/24 (from the past 48 hours)  Resp panel by RT-PCR (RSV, Flu A&B, Covid) Anterior Nasal Swab     Status: None   Collection Time: 02/03/24  9:56 AM   Specimen: Anterior Nasal Swab  Result Value Ref Range   SARS Coronavirus 2 by RT PCR NEGATIVE NEGATIVE   Influenza A by PCR NEGATIVE NEGATIVE   Influenza B by PCR NEGATIVE NEGATIVE    Comment: (NOTE) The Xpert Xpress SARS-CoV-2/FLU/RSV plus assay is intended as an aid in the diagnosis of influenza from Nasopharyngeal swab specimens and should not be used as a sole basis for treatment. Nasal washings and aspirates are unacceptable for Xpert Xpress SARS-CoV-2/FLU/RSV testing.  Fact Sheet for Patients: BloggerCourse.com  Fact Sheet for Healthcare Providers: SeriousBroker.it  This test is not yet approved or cleared by the [place]  FDA and has been authorized for detection and/or diagnosis of SARS-CoV-2 by FDA under an Emergency Use Authorization (EUA). This EUA will remain in effect (meaning this test can be used) for the duration of the COVID-19 declaration under Section 564(b)(1) of the Act, 21 U.S.C. section 360bbb-3(b)(1), unless the  authorization is terminated or revoked.     Resp Syncytial Virus by PCR NEGATIVE NEGATIVE    Comment: (NOTE) Fact Sheet for Patients: BloggerCourse.com  Fact Sheet for Healthcare Providers: SeriousBroker.it  This test is not yet approved or cleared by the [place]  FDA and has been authorized for detection and/or diagnosis of SARS-CoV-2 by FDA under an Emergency Use Authorization (EUA). This EUA will remain in effect (meaning this test can be used) for the duration of the COVID-19 declaration under Section 564(b)(1) of the Act, 21 U.S.C. section 360bbb-3(b)(1), unless the authorization is terminated or revoked.  Performed at [facility], [address], [place], [place] [address]   Basic metabolic panel     Status: Abnormal   Collection Time: 02/03/24 11:13 AM  Result Value Ref Range   Sodium 140 135 - 145 mmol/L   Potassium 4.9 3.5 - 5.1 mmol/L   Chloride 110 98 - 111 mmol/L   CO2 18 (L) 22 - 32 mmol/L   Glucose, Bld 123 (H) 70 - 99 mg/dL    Comment: Glucose reference range applies only to samples taken after fasting for at least 8 hours.   BUN 120 (H) 8 - 23 mg/dL   Creatinine, Ser 4.98 (H) 0.61 -  1.24 mg/dL   Calcium  9.1 8.9 - 10.3 mg/dL   GFR, Estimated 11 (L) >60 mL/min    Comment: (NOTE) Calculated using the CKD-EPI Creatinine Equation (2021)    Anion gap 12 5 - 15    Comment: Performed at [facility], [address], [place], [place] [address]  CBC with Differential     Status: Abnormal   Collection Time: 02/03/24 11:13 AM  Result Value Ref Range   WBC 7.7 4.0 - 10.5 K/uL   RBC 2.87 (L) 4.22 - 5.81 MIL/uL   Hemoglobin 9.6 (L) 13.0 - 17.0 g/dL   HCT 68.0 (L) 60.9 - 47.9 %   MCV 111.1 (H) 80.0 - 100.0 fL   MCH 33.4 26.0 - 34.0 pg   MCHC 30.1 30.0 - 36.0 g/dL   RDW 74.7 (H) 88.4 - 84.4 %   Platelets 155 150 - 400 K/uL   nRBC 0.0 0.0 - 0.2 %   Neutrophils Relative % 88 %   Neutro Abs 6.8 1.7 -  7.7 K/uL   Lymphocytes Relative 3 %   Lymphs Abs 0.2 (L) 0.7 - 4.0 K/uL   Monocytes Relative 9 %   Monocytes Absolute 0.7 0.1 - 1.0 K/uL   Eosinophils Relative 0 %   Eosinophils Absolute 0.0 0.0 - 0.5 K/uL   Basophils Relative 0 %   Basophils Absolute 0.0 0.0 - 0.1 K/uL   WBC Morphology See Note     Comment:  Mild Left Shift. 1 to 5% Metas, occ myelo   Smear Review See Note     Comment:  Normal Platelet Morphology   Polychromasia PRESENT     Comment: Performed at [facility], [address] [place], [place] [address]  Brain natriuretic peptide     Status: Abnormal   Collection Time: 02/03/24 11:13 AM  Result Value Ref Range   B Natriuretic Peptide 704.6 (H) 0.0 - 100.0 pg/mL    Comment: Performed at [facility], [address] [place], [place] [address]    Chemistries  Recent Labs  Lab 02/03/24 1113  NA 140  K 4.9  CL 110  CO2 18*  GLUCOSE 123*  BUN 120*  CREATININE 5.01*  CALCIUM  9.1   ------------------------------------------------------------------------------------------------------------------  ------------------------------------------------------------------------------------------------------------------ GFR: CrCl cannot be calculated (Unknown ideal weight.). Liver Function Tests: No results for input(s): AST, ALT, ALKPHOS, BILITOT, PROT, ALBUMIN  in the last 168 hours. No results for input(s): LIPASE, AMYLASE in the last 168 hours. No results for input(s): AMMONIA in the last 168 hours. Coagulation Profile: No results for input(s): INR, PROTIME in the last 168 hours. Cardiac Enzymes: No results for input(s): CKTOTAL, CKMB, CKMBINDEX, TROPONINI in the last 168 hours. BNP (last 3 results) No results for input(s): PROBNP in the last 8760 hours. HbA1C: No results for input(s): HGBA1C in the last 72 hours. CBG: No results for input(s): GLUCAP in the last 168 hours. Lipid Profile: No results for input(s):  CHOL, HDL, LDLCALC, TRIG, CHOLHDL, LDLDIRECT in the last 72 hours. Thyroid  Function Tests: No results for input(s): TSH, T4TOTAL, FREET4, T3FREE, THYROIDAB in the last 72 hours. Anemia Panel: No results for input(s): VITAMINB12, FOLATE, FERRITIN, TIBC, IRON , RETICCTPCT in the last 72 hours.  --------------------------------------------------------------------------------------------------------------- Urine analysis:    Component Value Date/Time   COLORURINE YELLOW 01/02/2024 0110   APPEARANCEUR CLEAR 01/02/2024 0110   LABSPEC 1.014 01/02/2024 0110   PHURINE 5.0 01/02/2024 0110   GLUCOSEU NEGATIVE 01/02/2024 0110   HGBUR NEGATIVE 01/02/2024 0110  BILIRUBINUR NEGATIVE 01/02/2024 0110   KETONESUR NEGATIVE 01/02/2024 0110   PROTEINUR 30 (A) 01/02/2024 0110   UROBILINOGEN 1.0 02/13/2015 1934   NITRITE NEGATIVE 01/02/2024 0110   LEUKOCYTESUR NEGATIVE 01/02/2024 0110      Imaging Results:    DG Chest 2 View Result Date: 02/03/2024 CLINICAL DATA:  Shortness of breath EXAM: CHEST - 2 VIEW COMPARISON:  11/01/2023 FINDINGS: Similar low lung volumes with cardiomegaly, vascular congestion, and basilar opacities favored to be atelectasis. Suspect small bilateral pleural effusions. No pneumothorax. Trachea midline. Degenerative changes of the spine. Very limited lateral view because of technique. IMPRESSION: 1. Cardiomegaly with vascular congestion and basilar atelectasis. 2. Suspect small bilateral pleural effusions. Electronically Signed   By: CHRISTELLA.  Shick M.D.   On: 02/03/2024 10:56    My personal review of EKG: Rhythm NSR, first-degree AV block, nonspecific ST-T changes   Assessment & Plan:    Principal Problem:   Acute CHF (HCC)   Acute on chronic CHF exacerbation(combined)-patient presented with worsening shortness of breath, chest x-ray showed cardiomegaly with vascular congestion.  BNP elevated at 704, he does have chronic BNP elevation.  Does not  appear to be in significant fluid overload.  Has been given Lasix  40 mg IV by the ED provider.  Will assess diuretic response and follow BUN/creatinine in AM.  In the meantime we will start Lasix  40 mg IV twice daily starting from tomorrow morning.  Might have to adjust the dose of Lasix  based on levels of creatinine.  Strict intake and output, daily weights.  Last echo report is from June 2025 which showed EF of 35 to 40% with global hypokinesis, LVH, grade 2 diastolic dysfunction.  Acute on chronic kidney disease stage IV-patient baseline creatinine around 3, now presenting with creatinine of 5.0, likely in setting of cardiorenal syndrome.  Patient only takes torsemide  20 mg daily at home.  Nephrology has been consulted by the ED provider.  Started on Lasix  IV as above.  Will closely watch patient's BUN/creatinine.  Started on Lasix  IV 40 twice daily from tomorrow morning.  Diabetes mellitus type 2-initiate sliding scale insulin  on NovoLog .  Check CBG before every meal and at bedtime.  Chronic hypoxemic respiratory failure-likely in setting of CHF, patient is on baseline 3 L/min of oxygen via nasal cannula.  Currently on 3 L/min of oxygen.  Diabetic foot ulcer-patient has a large bleeding ulcer noted on the lateral aspect of right foot.  MRI of right foot has been ordered.  Will start empirically on vancomycin  and Zosyn .  Antibiotics can be discontinued if MRI shows no evidence for osteomyelitis.  Check ESR.  Will consult wound care RN.  Follow blood culture results.  Peripheral pulses are palpable bilaterally  Peripheral neuropathy-gabapentin  on hold due to worsening renal function.  Generalized anxiety disorder-continue Zoloft   Diabetic gastroparesis-takes Reglan  at home.  Currently on hold.  Start Zofran  as needed for nausea and vomiting  Macrocytic anemia-MCV 111.  Hemoglobin stable at 9.6.  At baseline.  Likely in setting of CKD stage IV.  B12 was 834 in June 2025.   DVT Prophylaxis-   L  heparin   AM Labs Ordered, also please review Full Orders  Family Communication: Admission, patients condition and plan of care including tests being ordered have been discussed with the patient and his wife at bedside who indicate understanding and agree with the plan and Code Status.  Code Status: Full code  Admission status: Inpatient  * I certify that at the point of admission  it is my clinical judgment that the patient will require inpatient hospital care spanning beyond 2 midnights from the point of admission due to high intensity of service, high risk for further deterioration and high frequency of surveillance required.*  Time spent in minutes : 60 min   Elyse Prevo S Damian Buckles M.D

## 2024-02-03 NOTE — ED Notes (Signed)
 Daughter Meshilem Machuca [phone] would like an update asap

## 2024-02-03 NOTE — ED Notes (Signed)
 CCMD callled

## 2024-02-03 NOTE — ED Notes (Signed)
 Pt incontinent of stool and urine at this time. Fresh diaper placed. Minimal skin breakdown noted to pt's coccyx. Pt states skin breakdown has been there for a while.

## 2024-02-03 NOTE — ED Provider Triage Note (Signed)
 Emergency Medicine Provider Triage Evaluation Note  David [name] , a 78 y.o. male  was evaluated in triage.  Pt complains of cough and shortness of breath.  Review of Systems  Positive: Cough, SOB  Negative: Chest pain  Physical Exam  BP 132/66   Pulse 63   Temp (!) 97.5 F (36.4 C)   Resp 18   SpO2 97%  Gen:   Awake, no distress   Resp:  Normal effort  MSK:   Moves extremities without difficulty  Other:    Medical Decision Making  Medically screening exam initiated at 9:55 AM.  Appropriate orders placed.  CALLIN ASHE was informed that the remainder of the evaluation will be completed by another provider, this initial triage assessment does not replace that evaluation, and the importance of remaining in the ED until their evaluation is complete.     Ula Prentice SAUNDERS, MD 02/03/24 [phone]

## 2024-02-03 NOTE — Progress Notes (Signed)
 Pharmacy Antibiotic Note  David [name] is a 78 y.o. male for which pharmacy has been consulted for vancomycin  and zosyn  dosing for sepsis.  SCr 5.01 - AKI WBC 7.7; LA 1.3; T 93.3; HR 58; RR 12 COVID neg / flu neg  Plan: Zosyn  2.25g q6h Vancomycin  1750 mg once, subsequent dosing as indicated per random vancomycin  level until renal function stable and/or improved, at which time scheduled dosing can be considered Monitor WBC, fever, renal function, cultures De-escalate when able     Temp (24hrs), Avg:94.4 F (34.7 C), Min:92.3 F (33.5 C), Max:97.5 F (36.4 C)  Recent Labs  Lab 02/03/24 1113 02/03/24 1720  WBC 7.7  --   CREATININE 5.01*  --   LATICACIDVEN  --  1.3    CrCl cannot be calculated (Unknown ideal weight.).    Allergies  Allergen Reactions   Nsaids Other (See Comments)    CKD stage 3      Microbiology results: Pending  Thank you for allowing pharmacy to be a part of this patient's care.  Dorn Buttner, PharmD, BCPS 02/03/2024 6:37 PM ED Clinical Pharmacist -  [phone]

## 2024-02-04 DIAGNOSIS — Z8679 Personal history of other diseases of the circulatory system: Secondary | ICD-10-CM

## 2024-02-04 DIAGNOSIS — I739 Peripheral vascular disease, unspecified: Secondary | ICD-10-CM

## 2024-02-04 DIAGNOSIS — N184 Chronic kidney disease, stage 4 (severe): Secondary | ICD-10-CM | POA: Diagnosis not present

## 2024-02-04 DIAGNOSIS — M869 Osteomyelitis, unspecified: Secondary | ICD-10-CM

## 2024-02-04 DIAGNOSIS — I5033 Acute on chronic diastolic (congestive) heart failure: Secondary | ICD-10-CM

## 2024-02-04 DIAGNOSIS — E1169 Type 2 diabetes mellitus with other specified complication: Secondary | ICD-10-CM | POA: Insufficient documentation

## 2024-02-04 DIAGNOSIS — L8990 Pressure ulcer of unspecified site, unspecified stage: Secondary | ICD-10-CM

## 2024-02-04 DIAGNOSIS — M868X7 Other osteomyelitis, ankle and foot: Secondary | ICD-10-CM

## 2024-02-04 DIAGNOSIS — I10 Essential (primary) hypertension: Secondary | ICD-10-CM

## 2024-02-04 DIAGNOSIS — E119 Type 2 diabetes mellitus without complications: Secondary | ICD-10-CM

## 2024-02-04 LAB — COMPREHENSIVE METABOLIC PANEL WITH GFR
ALT: 18 U/L (ref 0–44)
AST: 32 U/L (ref 15–41)
Albumin: 2.7 g/dL — ABNORMAL LOW (ref 3.5–5.0)
Alkaline Phosphatase: 140 U/L — ABNORMAL HIGH (ref 38–126)
Anion gap: 14 (ref 5–15)
BUN: 126 mg/dL — ABNORMAL HIGH (ref 8–23)
CO2: 20 mmol/L — ABNORMAL LOW (ref 22–32)
Calcium: 8.8 mg/dL — ABNORMAL LOW (ref 8.9–10.3)
Chloride: 106 mmol/L (ref 98–111)
Creatinine, Ser: 5.31 mg/dL — ABNORMAL HIGH (ref 0.61–1.24)
GFR, Estimated: 11 mL/min — ABNORMAL LOW (ref >60)
Glucose, Bld: 98 mg/dL (ref 70–99)
Potassium: 4.9 mmol/L (ref 3.5–5.1)
Sodium: 140 mmol/L (ref 135–145)
Total Bilirubin: 1.3 mg/dL — ABNORMAL HIGH (ref 0.0–1.2)
Total Protein: 7.7 g/dL (ref 6.5–8.1)

## 2024-02-04 LAB — CBC
HCT: 29.2 % — ABNORMAL LOW (ref 39.0–52.0)
Hemoglobin: 8.9 g/dL — ABNORMAL LOW (ref 13.0–17.0)
MCH: 33.5 pg (ref 26.0–34.0)
MCHC: 30.5 g/dL (ref 30.0–36.0)
MCV: 109.8 fL — ABNORMAL HIGH (ref 80.0–100.0)
Platelets: 141 K/uL — ABNORMAL LOW (ref 150–400)
RBC: 2.66 MIL/uL — ABNORMAL LOW (ref 4.22–5.81)
RDW: 25.1 % — ABNORMAL HIGH (ref 11.5–15.5)
WBC: 7.5 K/uL (ref 4.0–10.5)
nRBC: 0 % (ref 0.0–0.2)

## 2024-02-04 LAB — GLUCOSE, CAPILLARY
Glucose-Capillary: 94 mg/dL (ref 70–99)
Glucose-Capillary: 87 mg/dL (ref 70–99)
Glucose-Capillary: 81 mg/dL (ref 70–99)
Glucose-Capillary: 89 mg/dL (ref 70–99)

## 2024-02-04 LAB — PHOSPHORUS: Phosphorus: 5.5 mg/dL — ABNORMAL HIGH (ref 2.5–4.6)

## 2024-02-04 MED ORDER — CHLORHEXIDINE GLUCONATE CLOTH 2 % EX PADS
6.0000 | MEDICATED_PAD | Freq: Every day | CUTANEOUS | Status: DC
  Administered 2024-02-04 – 2024-02-15 (×11): 6 via TOPICAL

## 2024-02-04 MED ORDER — DARBEPOETIN ALFA 40 MCG/0.4ML IJ SOSY
40.0000 ug | PREFILLED_SYRINGE | Freq: Once | INTRAMUSCULAR | Status: AC
  Administered 2024-02-04: 40 ug via SUBCUTANEOUS
  Filled 2024-02-04: qty 0.4

## 2024-02-04 NOTE — Progress Notes (Deleted)
 Patient going to dialysis, Resp. Theraphy here to help with o2 and transfer

## 2024-02-04 NOTE — Assessment & Plan Note (Signed)
No chest pain, no acute coronary syndrome.  

## 2024-02-04 NOTE — Assessment & Plan Note (Signed)
 Echocardiogram with reduced LV systolic function EF 35 to 40%, mild LVH, grade II diastolic dysfunction (pseudo normalization EA), RV with mild reduction in systolic function, RVSP 89.3 mmHg, LA with mild dilatation, mild MR, mild TR.   Limited medical therapy due to acutely reduced GFR. Continue blood pressure monitoring.

## 2024-02-04 NOTE — Assessment & Plan Note (Signed)
 Left heal pressure ulcer, present on admission, not able to stage.

## 2024-02-04 NOTE — Assessment & Plan Note (Addendum)
 Glucose has been stable, poor oral intake.  Fasting glucose capillary today was 119 mg/dl  Plan to hold on insulin  therapy and will do capillary glucose checks as needed.  Continue with statin therapy.

## 2024-02-04 NOTE — Assessment & Plan Note (Addendum)
 Hypernatremia.  Advanced renal disease with serum cr today at 5.10 with K at 4.5 and serum bicarbonate at 17  Na 146 and p 4.7  Foley catheter has been placed with urine output of 800 cc   Very poor oral intake.  Acute metabolic encephalopathy, possible mild uremic symptoms, I suspect patient may have baseline cognitive impairment.   Continue supportive medical care.  Holding on loop diuretic for now.  May need renal replacement therapy, likely elevated risk for complications due to heart failure and elevated pulmonary pressures.  Patient not ambulatory at SNF  Follow up renal function in am. Follow up with nephrology recommendations.

## 2024-02-04 NOTE — Progress Notes (Addendum)
 Progress Note   David [name]: David [name] FMW:[id number] DOB: [birth year] DOA: 02/03/2024     1 DOS: the David [name] was seen and examined on 02/04/2024   Brief hospital course: David [name] was admitted to the hospital with the working diagnosis of volume overload, in the setting of progressive renal disease.   78 yo male with the past medical history of heart failure,, hypertension, CKD stage IV, chronic anemia, coronary artery disease, T2DM and chronic foot ulcers, who presented with dyspnea. David [name] was transferred from SNF for 2 weeks of worsening dyspnea. On his initial physical examination his blood pressure was 140/83, HR 58, RR 16 and 02 saturation 98%,  Lungs with no wheezing or rhonchi, heart with S1 and S2 present and regular, no rubs, abdomen with no distention and trace lower extremity edema.  Bilateral foot ulcers.   Na 140, K 4.9 Cl 110, bicarbonate 18, glucose 123, bun 120 cr 5.0  BNP 704  Lactic acid 1,3 Wbc 7.7 hgb 9.6 plt 155    Assessment and Plan: * Acute on chronic diastolic CHF (congestive heart failure) (HCC) Echocardiogram with reduced LV systolic function EF 35 to 40%, mild LVH, grade II diastolic dysfunction (pseudo normalization EA), RV with mild reduction in systolic function, RVSP 89.3 mmHg, LA with mild dilatation, mild MR, mild TR.   Improved volume status. Limited medical therapy due to acutely reduced GFR.  Essential hypertension Continue blood pressure monitoring.   History of CAD (coronary artery disease) No chest pain, no acute coronary syndrome   CKD (chronic kidney disease) stage 4, GFR 15-29 ml/min (HCC) Renal function with progressive decline in GFR.  Na 140, K 4,9, bicarbonate 20, BUN 126 and cr 5,31  Acute metabolic encephalopathy, possible mild uremic symptoms.  Continue supportive medical care.  Holding on loop diuretic for now.  May need renal replacement therapy, likely elevated risk for complications due to heart failure and elevated  pulmonary pressures.   Follow up renal function in am. Nephrology has been consulted.   PAD (peripheral artery disease) (HCC) Continue blood pressure monitoring   Pressure injury of skin Left heal pressure ulcer, present on admission, not able to stage.   Foot osteomyelitis, right (HCC) MRI with osteomyelitis of the fifth proximal phalanx and fifth metatarsal head and neck with overlying soft tissue ulceration at the lateral plantar forefoot. No loculated fluid collection.   Continue antibiotic therapy with vancomycin  and Zosyn  Will consult orthopedics for possible surgical intervention.   Type 2 diabetes mellitus (HCC) Continue insulin  sliding scale for glucose cover and monitoring         Subjective: David [name] has been somnolent today with episodic confusion per nursing, his wife at the bedside, David [name] has been not ambulatory at SNF  Physical Exam: Vitals:   02/04/24 0559 02/04/24 0630 02/04/24 0907 02/04/24 1112  BP: 121/69  122/68 119/67  Pulse: 66  70 69  Resp: 20  16 (!) 22  Temp: 98.1 F (36.7 C) 98.2 F (36.8 C) 98.7 F (37.1 C) 98.3 F (36.8 C)  TempSrc: Oral Rectal Oral Oral  SpO2: 100%  100% 100%  Weight:  93.9 kg     Neurology somnolent, responds to touch and voice, yes and no to simple questions ENT with mild pallor Cardiovascular with S1 and S2 present and regular with no gallops or rubs Respiratory with poor inspiratory effort with no wheezing or rhonchi, positive rales Abdomen with no distention  Positive lower extremity edema +, dressing in place bilateral feet and protective boots  in place  Data Reviewed:    Family Communication: I spoke with David [name]'s wife at the bedside, we talked in detail about David [name]'s condition, plan of care and prognosis and all questions were addressed.   Disposition: Status is: Inpatient Remains inpatient appropriate because: IV antibiotics and renal function monitoring   Planned Discharge Destination: Skilled  nursing facility     Author: Elidia Toribio Furnace, MD 02/04/2024 4:37 PM  For on call review [email or web address].

## 2024-02-04 NOTE — Progress Notes (Signed)
 Patient refused 2200 scheduled medications, patient repeatedly stated no when RN asked him to take his medications.

## 2024-02-04 NOTE — Assessment & Plan Note (Signed)
 MRI with osteomyelitis of the fifth proximal phalanx and fifth metatarsal head and neck with overlying soft tissue ulceration at the lateral plantar forefoot. No loculated fluid collection.   Continue antibiotic therapy with vancomycin  and Zosyn  Will consult orthopedics for possible surgical intervention.

## 2024-02-04 NOTE — Assessment & Plan Note (Signed)
 Continue losartan  for blood pressure control.

## 2024-02-04 NOTE — Consult Note (Addendum)
 WOC Nurse Consult Note: Consult requested for BLE.  Secure chat messages sent to the primary team as follows, According to the MRI results, Osteomyelitis of the fifth proximal phalanx and the fifth metatarsal head and neck with overlying soft tissue ulceration at the lateral plantar forefoot.  This complex medical condition is beyond the scope of practice for WOC nurses.  I will provide topical treatment recommendations, but please consult ortho service for further plan of care.  Left heel with chronic Unstageable pressure injury, 100% dry eschar, 3X3cm Right heel with chronic Unstageable pressure injury; 100% dry eschar, 2X2cm Right outer foot with full thickness wound with osteomyelitis; 5X3cm, red with bloody drainage to anteror foot, eschar to plantar foot.  It is best practice to leave dry stable eschar in place to heels.   Topical treatment orders provided for bedside nurses to perform as follows:  Prevalon boots to reduce pressure.  1. Apply Betadine and dry gauze to left and right heel wounds Q day, then cover with kerlex 2. Apply Xeroform gauze to right outer foot Q day and cover with dry gauze and kerlex until further plan of care is provided by the ortho team.   Thank-you,  Stephane Fought MSN, RN, CWOCN, CWCN-AP, CNS Contact Mon-Fri 0700-1500: [phone]

## 2024-02-04 NOTE — Hospital Course (Addendum)
 David [name] was admitted to the hospital with the working diagnosis of volume overload, in the setting of progressive renal disease.   78 yo male with the past medical history of heart failure,, hypertension, CKD stage IV, chronic anemia, coronary artery disease, T2DM and chronic foot ulcers, who presented with dyspnea. Patient was transferred from SNF for 2 weeks of worsening dyspnea. On his initial physical examination his blood pressure was 140/83, HR 58, RR 16 and 02 saturation 98%,  Lungs with no wheezing or rhonchi, heart with S1 and S2 present and regular, no rubs, abdomen with no distention and trace lower extremity edema.  Bilateral foot ulcers.   Na 140, K 4.9 Cl 110, bicarbonate 18, glucose 123, bun 120 cr 5.0  BNP 704  Lactic acid 1,3 Wbc 7.7 hgb 9.6 plt 155   09/19 worsening renal function, patient marginal candidate for renal replacement therapy.  Will need dialysis prior to left foot osteomyelitis amputation.  09/20 advanced renal failure, not clear if patient is candidate for renal replacement therapy.

## 2024-02-04 NOTE — Progress Notes (Signed)
 [facility] Progress Note  Name: EGOR FULLILOVE MRN: [id number] DOB: 09-24-1945  Chief Complaint:  Shortness of breath   Subjective:  He had 700 mL UOP recorded over 9/17 (likely after arrival to the floor).  He had elevated bladder scan at 405 mL.  There is no note to indicate if he was in/out cath'd.  He is married and is ok with me calling his wife.  He would like to discuss dialysis further tomorrow, not today.  I called his wife and she states that he will frequently wait until the last minute and he likes to avoid things that are unpleasant.  She states that he doesn't go to the doctor much.  He saw Dr. Gearline once at [facility] in 09/2019 then was lost to follow-up; Cr was 1.89 at that time.    Review of systems:  He states that his breathing is alright  Denies chest pain  Denies n/v  ----------------- Background on consult:  PARTICK MUSSELMAN is a 78 y.o. male with a history of chronic kidney disease, coronary artery disease, chronic combined systolic and diastolic congestive heart failure, type 2 diabetes mellitus and hypertension who presented to the hospital from his SNF with worsening shortness of breath.  His baseline creatinine per review of labs below is approximately 2.5-3.  Nephrology is consulted for assistance with management of AKI.  The ER ordered lasix  40 mg IV once.  There is no urine output recorded.  He does not appear to be on any diuretics as an outpatient per his med rec.  Note that on chart review, palliative care has seen him on a prior admission and had recommended outpatient palliative support.  Nephrology has seen him during prior admission (at least in 08/2023).  He had labs with Dr. Gearline at [facility] in 08/2019 but no more recent labs are available in [facility] suggesting he was lost to follow-up. He does not recall.  He doesn't recall seeing palliative care previously.  Breathing is better than when he came in.  His abdomen feels  a little distended to him.  He doesn't know where he would usually build up fluid.  He's on oxygen at the SNF.  I asked what happened to his foot and he states diabetes.  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        Intake/Output Summary (Last 24 hours) at 02/04/2024 0956 Last data filed at 02/04/2024 0235 Gross per 24 hour  Intake 510 ml  Output 710 ml  Net -200 ml    Vitals:  Vitals:   02/04/24 0400 02/04/24 0559 02/04/24 0630 02/04/24 0907  BP:  121/69  122/68  Pulse:  66  70  Resp:  20  16  Temp: (!) 96.3 F (35.7 C) 98.1 F (36.7 C) 98.2 F (36.8 C) 98.7 F (37.1 C)  TempSrc: Rectal Oral Rectal Oral  SpO2:  100%  100%  Weight:   93.9 kg      Physical Exam:  General adult male in bed in no acute distress HEENT normocephalic atraumatic extraocular movements intact sclera anicteric Neck supple trachea midline Lungs clear to auscultation bilaterally normal work of breathing at rest on oxygen Heart S1S2 no rub Abdomen soft nontender nondistended Extremities no pitting edema  Neuro patient states doesn't know the year; oriented to person Psych normal mood and affect GU purewick on    Medications reviewed   Labs:     Latest Ref  Rng & Units 02/04/2024    3:18 AM 02/03/2024    8:13 PM 02/03/2024   11:13 AM  BMP  Glucose 70 - 99 mg/dL 98   876   BUN 8 - 23 mg/dL 873   879   Creatinine 0.61 - 1.24 mg/dL 4.68  4.82  4.98   Sodium 135 - 145 mmol/L 140   140   Potassium 3.5 - 5.1 mmol/L 4.9   4.9   Chloride 98 - 111 mmol/L 106   110   CO2 22 - 32 mmol/L 20   18   Calcium  8.9 - 10.3 mg/dL 8.8   9.1      Assessment/Plan:      # AKI  - May be cardiorenal vs CKD progression.  Setting of multiple leg wounds, as well  - Note pharmacy is consulted for dosing of vanc and zosyn    -  Continue supportive care and follow trends  - pause lasix   - Repeat post-void bladder scan and place foley if above 300 ml retained  - I have reached out to his  wife about his goals as above.  I will speak with the patient and his wife again tomorrow about this as well    # CKD stage IV  - note baseline Cr is 2.5 - 3.0  - Secondary to diabetic nephropathy and microvascular disease from HTN     # Urinary retention  - Repeat post-void bladder scan and place foley if above 300 ml retained   - I spoke with his RN and appreciate her assistance    # Chronic combined systolic and diastolic congestive heart failure - Pause lasix  and assess diuretic needs daily      # HTN  - Acceptable on current regimen    # Metabolic acidosis - Setting of AKI - improved with diuretics    # Anemia of CKD - Macrocytic anemia  - Concern for nutrient deficiency - Iron  is replete  - B12 and folate ok this summer - aranesp  40 mcg once    Disposition - continue inpatient monitoring     Katheryn JAYSON Saba, MD 02/04/2024 11:07 AM

## 2024-02-05 DIAGNOSIS — M86271 Subacute osteomyelitis, right ankle and foot: Secondary | ICD-10-CM

## 2024-02-05 DIAGNOSIS — Z8679 Personal history of other diseases of the circulatory system: Secondary | ICD-10-CM | POA: Diagnosis not present

## 2024-02-05 DIAGNOSIS — I5033 Acute on chronic diastolic (congestive) heart failure: Secondary | ICD-10-CM | POA: Diagnosis not present

## 2024-02-05 DIAGNOSIS — I96 Gangrene, not elsewhere classified: Secondary | ICD-10-CM

## 2024-02-05 DIAGNOSIS — N184 Chronic kidney disease, stage 4 (severe): Secondary | ICD-10-CM | POA: Diagnosis not present

## 2024-02-05 DIAGNOSIS — I10 Essential (primary) hypertension: Secondary | ICD-10-CM | POA: Diagnosis not present

## 2024-02-05 LAB — BASIC METABOLIC PANEL WITH GFR
Anion gap: 15 (ref 5–15)
BUN: 120 mg/dL — ABNORMAL HIGH (ref 8–23)
CO2: 16 mmol/L — ABNORMAL LOW (ref 22–32)
Calcium: 8.6 mg/dL — ABNORMAL LOW (ref 8.9–10.3)
Chloride: 115 mmol/L — ABNORMAL HIGH (ref 98–111)
Creatinine, Ser: 5.34 mg/dL — ABNORMAL HIGH (ref 0.61–1.24)
GFR, Estimated: 10 mL/min — ABNORMAL LOW (ref >60)
Glucose, Bld: 117 mg/dL — ABNORMAL HIGH (ref 70–99)
Potassium: 4.7 mmol/L (ref 3.5–5.1)
Sodium: 146 mmol/L — ABNORMAL HIGH (ref 135–145)

## 2024-02-05 LAB — CBC
HCT: 30.1 % — ABNORMAL LOW (ref 39.0–52.0)
Hemoglobin: 9.4 g/dL — ABNORMAL LOW (ref 13.0–17.0)
MCH: 33.9 pg (ref 26.0–34.0)
MCHC: 31.2 g/dL (ref 30.0–36.0)
MCV: 108.7 fL — ABNORMAL HIGH (ref 80.0–100.0)
Platelets: 128 K/uL — ABNORMAL LOW (ref 150–400)
RBC: 2.77 MIL/uL — ABNORMAL LOW (ref 4.22–5.81)
RDW: 25.6 % — ABNORMAL HIGH (ref 11.5–15.5)
WBC: 10.9 K/uL — ABNORMAL HIGH (ref 4.0–10.5)
nRBC: 0.2 % (ref 0.0–0.2)

## 2024-02-05 LAB — GLUCOSE, CAPILLARY
Glucose-Capillary: 85 mg/dL (ref 70–99)
Glucose-Capillary: 108 mg/dL — ABNORMAL HIGH (ref 70–99)
Glucose-Capillary: 109 mg/dL — ABNORMAL HIGH (ref 70–99)
Glucose-Capillary: 122 mg/dL — ABNORMAL HIGH (ref 70–99)

## 2024-02-05 MED ORDER — PIPERACILLIN-TAZOBACTAM IN DEX 2-0.25 GM/50ML IV SOLN
2.2500 g | Freq: Three times a day (TID) | INTRAVENOUS | Status: DC
  Filled 2024-02-05: qty 50

## 2024-02-05 MED ORDER — SODIUM BICARBONATE 650 MG PO TABS
650.0000 mg | ORAL_TABLET | Freq: Two times a day (BID) | ORAL | Status: DC
  Administered 2024-02-05 – 2024-02-07 (×5): 650 mg via ORAL
  Filled 2024-02-05 (×5): qty 1

## 2024-02-05 MED ORDER — PANTOPRAZOLE SODIUM 40 MG PO TBEC
40.0000 mg | DELAYED_RELEASE_TABLET | Freq: Every day | ORAL | Status: DC
  Administered 2024-02-05 – 2024-02-07 (×3): 40 mg via ORAL
  Filled 2024-02-05 (×4): qty 1

## 2024-02-05 MED ORDER — DEXTROSE 5 % IV SOLN: INTRAVENOUS | Status: DC

## 2024-02-05 MED ORDER — FREE WATER
200.0000 mL | Freq: Four times a day (QID) | Status: DC
  Administered 2024-02-05 – 2024-02-09 (×4): 200 mL via ORAL

## 2024-02-05 MED ORDER — NEPRO/CARBSTEADY PO LIQD
237.0000 mL | Freq: Two times a day (BID) | ORAL | Status: DC
  Administered 2024-02-05 – 2024-02-07 (×4): 237 mL via ORAL

## 2024-02-05 MED ORDER — LINEZOLID 600 MG PO TABS
600.0000 mg | ORAL_TABLET | Freq: Two times a day (BID) | ORAL | Status: DC
Start: 2024-02-05 — End: 2024-02-08
  Administered 2024-02-05 – 2024-02-08 (×6): 600 mg via ORAL
  Filled 2024-02-05 (×7): qty 1

## 2024-02-05 MED ORDER — SODIUM CHLORIDE 0.9 % IV SOLN: INTRAVENOUS | Status: AC

## 2024-02-05 MED ORDER — SODIUM CHLORIDE 0.9 % IV SOLN
1.5000 g | Freq: Two times a day (BID) | INTRAVENOUS | Status: DC
  Administered 2024-02-05 – 2024-02-07 (×5): 1.5 g via INTRAVENOUS
  Filled 2024-02-05 (×6): qty 4

## 2024-02-05 NOTE — Progress Notes (Signed)
 Progress Note   Patient: David [name] FMW:[id number] DOB: November 16, 1945 DOA: 02/03/2024     2 DOS: the patient was seen and examined on 02/05/2024   Brief hospital course: David [name] was admitted to the hospital with the working diagnosis of volume overload, in the setting of progressive renal disease.   78 yo male with the past medical history of heart failure,, hypertension, CKD stage IV, chronic anemia, coronary artery disease, T2DM and chronic foot ulcers, who presented with dyspnea. Patient was transferred from SNF for 2 weeks of worsening dyspnea. On his initial physical examination his blood pressure was 140/83, HR 58, RR 16 and 02 saturation 98%,  Lungs with no wheezing or rhonchi, heart with S1 and S2 present and regular, no rubs, abdomen with no distention and trace lower extremity edema.  Bilateral foot ulcers.   Na 140, K 4.9 Cl 110, bicarbonate 18, glucose 123, bun 120 cr 5.0  BNP 704  Lactic acid 1,3 Wbc 7.7 hgb 9.6 plt 155   09/19 worsening renal function, patient marginal candidate for renal replacement therapy.  Will need dialysis prior to left foot osteomyelitis amputation.   Assessment and Plan: * Acute on chronic diastolic CHF (congestive heart failure) (HCC) Echocardiogram with reduced LV systolic function EF 35 to 40%, mild LVH, grade II diastolic dysfunction (pseudo normalization EA), RV with mild reduction in systolic function, RVSP 89.3 mmHg, LA with mild dilatation, mild MR, mild TR.   Limited medical therapy due to acutely reduced GFR. Continue blood pressure monitoring.   Essential hypertension Continue blood pressure monitoring.   History of CAD (coronary artery disease) No chest pain, no acute coronary syndrome   CKD (chronic kidney disease) stage 4, GFR 15-29 ml/min (HCC) Hypernatremia.  Continue worsening renal function with serum cr at 5.34 with BUN 120, K is 4.7 and serum bicarbonate at 16  Na 146   Acute metabolic encephalopathy, possible  mild uremic symptoms.  Continue supportive medical care.  Holding on loop diuretic for now.  May need renal replacement therapy, likely elevated risk for complications due to heart failure and elevated pulmonary pressures.  Patient not ambulatory at SNF  Follow up renal function in am. Nephrology has been consulted.   PAD (peripheral artery disease) (HCC) Continue blood pressure monitoring   Pressure injury of skin Left heal pressure ulcer, present on admission, not able to stage.   Foot osteomyelitis, right (HCC) MRI with osteomyelitis of the fifth proximal phalanx and fifth metatarsal head and neck with overlying soft tissue ulceration at the lateral plantar forefoot. No loculated fluid collection.   Continue antibiotic therapy with linezolid  and unasyn .  Orthopedics consulted, patient will need renal replacement therapy before considering surgery.   Type 2 diabetes mellitus (HCC) Continue insulin  sliding scale for glucose cover and monitoring       Subjective: Patient not much interactive, mentions having pain but not dyspnea.   Physical Exam: Vitals:   02/05/24 0356 02/05/24 0436 02/05/24 0815 02/05/24 1205  BP: 120/86  136/78 129/70  Pulse: 65 67 71 70  Resp: 20 18 16 20   Temp: 98 F (36.7 C)  97.7 F (36.5 C) 98.7 F (37.1 C)  TempSrc: Oral  Oral Oral  SpO2: 93%  93% 92%  Weight:  93.3 kg     Neurology awake and alert, deconditioned and ill looking appearing, not interactive ENT with mild pallor Cardiovascular with S1 and S2 present and regular with no gallops, rubs or murmurs Respiratory with bilateral rales with poor inspiratory  effort, on anterior auscultation  Abdomen with no distention  Positive lower extremity edema  Data Reviewed:   Family Communication: no family at the bedside.  I spoke with patient's daughter at the bedside, we talked in detail about patient's condition, plan of care and prognosis and all questions were  addressed.  Disposition: Status is: Inpatient Remains inpatient appropriate because: worsening renal function   Planned Discharge Destination: Skilled nursing facility    Author: Elidia Toribio Furnace, MD 02/05/2024 5:27 PM  For on call review [email or web address].

## 2024-02-05 NOTE — Consult Note (Signed)
 ORTHOPAEDIC CONSULTATION  REQUESTING PHYSICIAN: Arrien, Elidia Sieving,*  Chief Complaint: Wet gangrenous changes lateral right foot and dry gangrenous changes left heel.  HPI: David [name] is a 78 y.o. male who presents with progressive gangrenous changes both lower extremities.  Patient is status post right great toe and second toe amputation and left fifth ray amputation.  Patient has chronic kidney disease with type 2 diabetes.  Past Medical History:  Diagnosis Date   Anemia    Arthritis    CAD in native artery    s/p stent in 11/18   CHF (congestive heart failure) (HCC)    normal echo in 11/18   CKD (chronic kidney disease) stage 3, GFR 30-59 ml/min (HCC)    DDD (degenerative disc disease), cervical    Diabetes mellitus with complication (HCC)    Type II   Diabetic neuropathy (HCC)    Diabetic retinopathy (HCC)    GERD (gastroesophageal reflux disease)    Glaucoma    Hypertension    Past Surgical History:  Procedure Laterality Date   AMPUTATION Right 07/09/2016   Procedure: Right Great Toe Amputation at Metatarsophalangeal Joint;  Surgeon: Jerona Harden GAILS, MD;  Location: [facility] OR;  Service: Orthopedics;  Laterality: Right;   AMPUTATION Right 05/28/2018   Procedure: RIGHT SECOND TOE AMPUTATION;  Surgeon: Harden Jerona GAILS, MD;  Location: [facility] OR;  Service: Orthopedics;  Laterality: Right;   AMPUTATION Left 08/03/2020   Procedure: LEFT 5TH RAY AMPUTATION;  Surgeon: Harden Jerona GAILS, MD;  Location: [facility] OR;  Service: Orthopedics;  Laterality: Left;   BACK SURGERY     4   CARDIAC CATHETERIZATION     CORONARY STENT INTERVENTION N/A 04/06/2017   Procedure: CORONARY STENT INTERVENTION;  Surgeon: Elmira Newman PARAS, MD;  Location: [facility];  Service: Cardiovascular;  Laterality: N/A;   CORONARY STENT INTERVENTION N/A 04/07/2017   Procedure: CORONARY STENT INTERVENTION;  Surgeon: Elmira Newman PARAS, MD;  Location: [facility];  Service: Cardiovascular;  Laterality:  N/A;   CYST EXCISION     on Back   ENDOTRACHEAL INTUBATION EMERGENT  08/07/2018       ESOPHAGOGASTRODUODENOSCOPY Left 11/07/2023   Procedure: EGD (ESOPHAGOGASTRODUODENOSCOPY);  Surgeon: Rosalie Kitchens, MD;  Location: [facility];  Service: Gastroenterology;  Laterality: Left;   JOINT REPLACEMENT     LAPAROSCOPIC ABDOMINAL EXPLORATION N/A 11/26/2017   Procedure: LAPAROSCOPIC ABDOMINAL EXPLORATION, DRAINAGE OF APPENDICEAL ABCESS. PLACEMENT OF DRAIN;  Surgeon: Ethyl Lenis, MD;  Location: [facility] OR;  Service: General;  Laterality: N/A;   RIGHT/LEFT HEART CATH AND CORONARY ANGIOGRAPHY N/A 04/06/2017   Procedure: RIGHT/LEFT HEART CATH AND CORONARY ANGIOGRAPHY;  Surgeon: Elmira Newman PARAS, MD;  Location: [facility];  Service: Cardiovascular;  Laterality: N/A;   TOE SURGERY  05/2018   Left    TOTAL HIP ARTHROPLASTY     Right    Social History   Socioeconomic History   Marital status: Married    Spouse name: [name]   Number of children: 2   Years of education: 16   Highest education level: Master's degree (e.g., MA, MS, MEng, MEd, MSW, MBA)  Occupational History   Occupation: Retired  Tobacco Use   Smoking status: Former    Current packs/day: 0.00    Average packs/day: 0.3 packs/day for 5.0 years (1.3 ttl pk-yrs)    Types: Cigarettes    Start date: 32    Quit date: 1996    Years since quitting: 29.7    Passive exposure:  Past   Smokeless tobacco: Never  Vaping Use   Vaping status: Never Used  Substance and Sexual Activity   Alcohol use: Not Currently    Comment: occ   Drug use: No   Sexual activity: Yes  Other Topics Concern   Not on file  Social History Narrative   Not on file   Social Drivers of Health   Financial Resource Strain: Low Risk  (05/18/2023)   Overall Financial Resource Strain (CARDIA)    Difficulty of Paying Living Expenses: Not very hard  Food Insecurity: Patient Unable To Answer (02/04/2024)   Hunger Vital Sign    Worried About Running Out of Food  in the Last Year: Patient unable to answer    Ran Out of Food in the Last Year: Patient unable to answer  Transportation Needs: No Transportation Needs (01/02/2024)   PRAPARE - Administrator, Civil Service (Medical): No    Lack of Transportation (Non-Medical): No  Physical Activity: Inactive (08/20/2017)   Exercise Vital Sign    Days of Exercise per Week: 0 days    Minutes of Exercise per Session: 0 min  Stress: No Stress Concern Present (08/20/2017)   [employer] of Occupational Health - Occupational Stress Questionnaire    Feeling of Stress : Not at all  Social Connections: Socially Integrated (01/02/2024)   Social Connection and Isolation Panel    Frequency of Communication with Friends and Family: Twice a week    Frequency of Social Gatherings with Friends and Family: Twice a week    Attends Religious Services: More than 4 times per year    Active Member of [employer] or Organizations: No    Attends Engineer, structural: 1 to 4 times per year    Marital Status: Married   Family History  Problem Relation Age of Onset   Diabetes Other    Hyperlipidemia Other    Hypertension Other    Stroke Other    Alzheimer's disease Other    Thyroid  disease Mother    Diabetes Mellitus II Father    Alzheimer's disease Father    - negative except otherwise stated in the family history section Allergies  Allergen Reactions   Nsaids Other (See Comments)    CKD stage 3    Prior to Admission medications   Medication Sig Start Date End Date Taking? Authorizing Provider  acetaminophen  (TYLENOL ) 500 MG tablet Take 2 tablets (1,000 mg total) by mouth in the morning and at bedtime. 11/21/23  Yes Barbarann Nest, MD  albuterol  (VENTOLIN  HFA) 108 [phone] Base) MCG/ACT inhaler Inhale 2 puffs into the lungs every 6 (six) hours as needed for wheezing or shortness of breath. 11/21/23  Yes Barbarann Nest, MD  ascorbic acid (VITAMIN C) 500 MG tablet Take 500 mg by mouth daily.   Yes [provider]  aspirin  EC 81 MG tablet Take 1 tablet (81 mg total) by mouth daily. 11/21/23  Yes Barbarann Nest, MD  atorvastatin  (LIPITOR ) 80 MG tablet Take 1 tablet (80 mg total) by mouth at bedtime. 11/21/23  Yes Barbarann Nest, MD  bisacodyl  (DULCOLAX) 10 MG suppository Place 1 suppository (10 mg total) rectally daily as needed for moderate constipation. 11/21/23  Yes Barbarann Nest, MD  Cholecalciferol  (VITAMIN D3) 1.25 MG (50000 UT) CAPS Take 50,000 Units by mouth every 30 (thirty) days.   Yes [provider]  Cyanocobalamin  (VITAMIN B-12 CR) 1000 MCG TBCR Take 1,000 mcg by mouth every 14 (fourteen) days.   Yes [provider]  dextromethorphan -guaiFENesin  (MUCINEX  DM) 30-600 MG 12hr tablet Take 1 tablet by mouth 2 (two) times daily. 11/21/23  Yes Barbarann Nest, MD  dicyclomine  (BENTYL ) 10 MG capsule Take 1 capsule (10 mg total) by mouth in the morning, at noon, in the evening, and at bedtime. 11/21/23  Yes Barbarann Nest, MD  docusate sodium  (COLACE) 100 MG capsule Take 1 capsule (100 mg total) by mouth 2 (two) times daily. 11/21/23  Yes Barbarann Nest, MD  ferrous sulfate  325 (65 FE) MG tablet Take 1 tablet (325 mg total) by mouth daily with breakfast. 11/21/23  Yes Barbarann Nest, MD  gabapentin  (NEURONTIN ) 100 MG capsule Take 2 capsules (200 mg total) by mouth 3 (three) times daily. Patient taking differently: Take 200 mg by mouth 2 (two) times daily. 11/21/23  Yes Barbarann Nest, MD  insulin  aspart (NOVOLOG ) 100 UNIT/ML injection Inject 2-12 Units into the skin in the morning and at bedtime. Per sliding scale, 201-250= 2 units, 251-300= 4 units, 301-350= 6 units, 351-400= 8 units, 401-450= 10 units, 451-500= 12 units, if greater than 400 call MD 01/06/24  Yes Cheryle Page, MD  isosorbide -hydrALAZINE  (BIDIL ) 20-37.5 MG tablet Take 2 tablets by mouth three times daily. 11/25/23  Yes Patwardhan, Manish J, MD  latanoprost  (XALATAN ) 0.005 % ophthalmic solution Place 1 drop into both  eyes at bedtime. 11/21/23  Yes Barbarann Nest, MD  lubiprostone  (AMITIZA ) 24 MCG capsule Take 1 capsule (24 mcg total) by mouth daily. 11/21/23  Yes Barbarann Nest, MD  melatonin 3 MG TABS tablet Take 1 tablet (3 mg total) by mouth at bedtime. 11/21/23  Yes Barbarann Nest, MD  methocarbamol  (ROBAXIN ) 500 MG tablet Take 1 tablet (500 mg total) by mouth every 6 (six) hours as needed for muscle spasms. 11/21/23  Yes Barbarann Nest, MD  metoCLOPramide  (REGLAN ) 5 MG tablet Take 1 tablet (5 mg total) by mouth 3 (three) times daily. 11/21/23 11/20/24 Yes Barbarann Nest, MD  Multiple Vitamins-Minerals (CERTAVITE SENIOR) TABS Take 1 tablet by mouth daily. 11/21/23  Yes Barbarann Nest, MD  nitroGLYCERIN  (NITROSTAT ) 0.4 MG SL tablet Place 1 tablet (0.4 mg total) under the tongue every 5 (five) minutes as needed for chest pain. 11/21/23  Yes Barbarann Nest, MD  nutrition supplement, JUVEN, (JUVEN) PACK Take 1 packet by mouth 2 (two) times daily between meals.   Yes [provider]  ondansetron  (ZOFRAN ) 4 MG tablet Take 1 tablet (4 mg total) by mouth every 6 (six) hours as needed for nausea or vomiting. 11/21/23  Yes Barbarann Nest, MD  OXYGEN Inhale 3 L/min into the lungs continuous.   Yes [provider]  pantoprazole  (PROTONIX ) 40 MG tablet Take 1 tablet (40 mg total) by mouth daily. 11/21/23  Yes Barbarann Nest, MD  polyethylene glycol (MIRALAX  / GLYCOLAX ) 17 g packet Take 17 g by mouth daily as needed for mild constipation or moderate constipation.   Yes [provider]  polyethylene glycol powder (GLYCOLAX /MIRALAX ) 17 GM/SCOOP powder Take 17 g by mouth daily. PRN order: 17 g by mouth daily as needed for constipation Patient taking differently: Take 17 g by mouth daily. 11/21/23  Yes Barbarann Nest, MD  pyridoxine  (B-6) 100 MG tablet Take 1 tablet (100 mg total) by mouth daily. 11/21/23  Yes Barbarann Nest, MD  sertraline  (ZOLOFT ) 25 MG tablet Take 1 tablet (25 mg total) by mouth daily. 11/21/23   Yes Barbarann Nest, MD  sodium chloride  (OCEAN) 0.65 % SOLN nasal spray Place 1 spray into both nostrils as needed  for congestion. Patient taking differently: Place 1 spray into both nostrils daily as needed for congestion. 11/21/23  Yes Barbarann Nest, MD  traMADol  (ULTRAM ) 50 MG tablet Take 1 tablet (50 mg total) by mouth every 12 (twelve) hours as needed for moderate pain (pain score 4-6) or severe pain (pain score 7-10). 01/07/24  Yes Cheryle Page, MD  traZODone  (DESYREL ) 100 MG tablet Take 1 tablet (100 mg total) by mouth at bedtime. 11/21/23  Yes Barbarann Nest, MD  ZINC OXIDE, TOPICAL, EX Apply 1 application  topically in the morning and at bedtime. Apply to buttocks topically every day and night shift   Yes [provider]   MR FOOT RIGHT WO CONTRAST Result Date: 02/03/2024 CLINICAL DATA:  Right foot ulcer.  Concern for osteomyelitis. EXAM: MRI OF THE RIGHT FOREFOOT WITHOUT CONTRAST TECHNIQUE: Multiplanar, multisequence MR imaging of the right forefoot was performed. No intravenous contrast was administered. COMPARISON:  Right foot radiographs dated 05/06/2023. MRI of the right foot dated 02/06/2015. FINDINGS: Bones/Joint/Cartilage Soft tissue ulceration at the lateral plantar forefoot, extends to the level of the underlying fifth MTP joint. Marrow edema with corresponding T1 hypointensity of the fifth proximal phalanx and the fifth metatarsal head and neck with associated areas of cortical indistinctness is compatible with osteomyelitis. No marrow signal abnormality identified elsewhere to suggest osteomyelitis. 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 No significant joint effusion. No fracture or dislocation. Mild degenerative arthropathy of the midfoot. Ligaments Lisfranc ligament is intact. Muscles and Tendons Fatty atrophy of the intrinsic musculature of the foot, likely reflects chronic denervation changes. No significant tenosynovitis. Soft tissue Soft tissue  ulceration at the lateral plantar forefoot at the level of the fifth MTP joint with surrounding soft tissue thickening and edema. No loculated fluid collection. Postoperative changes related to prior amputations of the first and second toes. IMPRESSION: 1. Osteomyelitis of the fifth proximal phalanx and the fifth metatarsal head and neck with overlying soft tissue ulceration at the lateral plantar forefoot. No loculated fluid collection. 2. Postoperative changes related to amputation of the first and second toes. 3. Chronic denervation changes of the intrinsic foot musculature. Electronically Signed   By: Harrietta Sherry M.D.   On: 02/03/2024 17:08   - pertinent xrays, CT, MRI studies were reviewed and independently interpreted  Positive ROS: All other systems have been reviewed and were otherwise negative with the exception of those mentioned in the HPI and as above.  Physical Exam: General: Alert, no acute distress Psychiatric: Patient is competent for consent with normal mood and affect Lymphatic: No axillary or cervical lymphadenopathy Cardiovascular: No pedal edema Respiratory: No cyanosis, no use of accessory musculature GI: No organomegaly, abdomen is soft and non-tender    Images:  @ENCIMAGES @  Labs:  Lab Results  Component Value Date   HGBA1C 6.0 (H) 10/30/2023   HGBA1C 6.4 (H) 05/04/2023   HGBA1C 8.6 (H) 06/29/2022   ESRSEDRATE 73 (H) 02/03/2024   ESRSEDRATE 96 (H) 05/04/2023   ESRSEDRATE 73 (H) 02/07/2015   CRP 3.4 (H) 05/04/2023   CRP 8.2 (H) 02/07/2015   LABURIC 4.4 08/12/2018   REPTSTATUS PENDING 02/03/2024   REPTSTATUS PENDING 02/03/2024   GRAMSTAIN NO WBC SEEN NO ORGANISMS SEEN  05/12/2023   CULT  02/03/2024    NO GROWTH 2 DAYS Performed at [facility], [address], [place], [place] [address]    CULT  02/03/2024    NO GROWTH 2 DAYS Performed at [name], [address] [place], [place] [address]    [facility] STAPHYLOCOCCUS EPIDERMIDIS  05/12/2023    Lab Results  Component Value Date   ALBUMIN  2.7 (L) 02/04/2024   ALBUMIN  2.6 (L) 01/02/2024   ALBUMIN  2.4 (L) 01/01/2024   PREALBUMIN 20 05/04/2023   LABURIC 4.4 08/12/2018        Latest Ref Rng & Units 02/05/2024    9:29 AM 02/04/2024    3:18 AM 02/03/2024    8:13 PM  CBC EXTENDED  WBC 4.0 - 10.5 K/uL 10.9  7.5  7.2   RBC 4.22 - 5.81 MIL/uL 2.77  2.66  2.89   Hemoglobin 13.0 - 17.0 g/dL 9.4  8.9  9.7   HCT 60.9 - 52.0 % 30.1  29.2  32.6   Platelets 150 - 400 K/uL 128  141  149     Neurologic: Patient does not have protective sensation bilateral lower extremities.   MUSCULOSKELETAL:   Skin: Examination there is wet gangrenous changes with foul-smelling odor and necrotic tissue lateral right foot.  Patient does not have palpable pulses.  Examination of the left foot he has dry gangrenous changes of the left heel.  There are no palpable pulses on the left foot.   Albumin  2.7.  Most recent hemoglobin A1c 6.0 sed rate 73 most recent ankle-brachial indices shows biphasic flow at the ankle with noncompressible vessels.  White cell count 10.9 with a hemoglobin of 9.4.  MRI scan of the right foot shows osteomyelitis of the lateral column right foot with no abscess.   Patient has progressive decreased renal function with BUN 120 normally around 40-50, creatinine 5.34 with usually between 2-3.   Assessment: Assessment: Progressive gangrenous changes both feet with wet gangrene of  lateral aspect of the right foot, dry gangrene of the left heel, with progressive renal failure.  Plan: Plan: Discussed with patient and his wife recommendation to proceed with a amputation of the right lower extremity.  Patient states that he is opposed to amputation surgery.  Patient would need dialysis prior to considering amputation surgery and recommended they discuss this with the hospitalist and renal team.  Thank you for the consult and the opportunity to see Mr. David Rana, MD [facility] [phone] 3:54 PM

## 2024-02-05 NOTE — Progress Notes (Signed)
 [facility] Progress Note  Name: David [name] MRN: [id number] DOB: 03-31-1946  Chief Complaint:  Shortness of breath   Subjective:  He had 1.3 liters UOP over 9/18.  Per charting, he refused some of his medications yesterday.   Note bladder scan on 9/18 with 396 mL retained x 2.  Foley catheter was placed.  Yesterday he had asked me to talk more about dialysis with him today (Friday).  Today as I spoke with him and his wife and a family friend at bedside, he yelled out in pain every few minutes.  He has not walked in several years.  Gets out of bed to the chair.  His wife thinks that she would want him to have dialysis if needed - we discussed that right now still working up AKI and hoping to improve renal function.  I let her know that if he was having a hard time lying in bed with pain that it might be quite difficult to make it through four hours of HD three times a week.   Review of systems:   He states that his breathing is alright  Denies chest pain  He has had nausea without vomiting  Pain of his backside   ----------------- Background on consult & notable interim history: David [name] is a 78 y.o. male with a history of chronic kidney disease, coronary artery disease, chronic combined systolic and diastolic congestive heart failure, type 2 diabetes mellitus and hypertension who presented to the hospital from his SNF with worsening shortness of breath.  His baseline creatinine per review of labs below is approximately 2.5-3.  Nephrology is consulted for assistance with management of AKI.  The ER ordered lasix  40 mg IV once.  There is no urine output recorded.  He does not appear to be on any diuretics as an outpatient per his med rec.  Note that on chart review, palliative care has seen him on a prior admission and had recommended outpatient palliative support.  Nephrology has seen him during prior admission (at least in 08/2023).  He had labs with Dr. Gearline at  [facility] in 08/2019 but no more recent labs are available in [facility] suggesting he was lost to follow-up. He does not recall.  He doesn't recall seeing palliative care previously.  Breathing is better than when he came in.  His abdomen feels a little distended to him.  He doesn't know where he would usually build up fluid.  He's on oxygen at the SNF.  I asked what happened to his foot and he states diabetes.  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     Per his wife, he will frequently wait until the last minute and he likes to avoid things that are unpleasant.  She states that he doesn't go to the doctor much.  He saw Dr. Gearline once at [facility] in 09/2019 then was lost to follow-up; Cr was 1.89 at that time.      Intake/Output Summary (Last 24 hours) at 02/05/2024 1401 Last data filed at 02/05/2024 1056 Gross per 24 hour  Intake 3 ml  Output 1250 ml  Net -1247 ml    Vitals:  Vitals:   02/05/24 0356 02/05/24 0436 02/05/24 0815 02/05/24 1205  BP: 120/86  136/78 129/70  Pulse: 65 67 71 70  Resp: 20 18 16 20   Temp: 98 F (36.7 C)  97.7 F (36.5 C) 98.7 F (37.1 C)  TempSrc:  Oral  Oral Oral  SpO2: 93%  93% 92%  Weight:  93.3 kg       Physical Exam:    General adult male in bed in no acute distress HEENT normocephalic atraumatic extraocular movements intact sclera anicteric Neck supple trachea midline Lungs clear to auscultation bilaterally normal work of breathing at rest on oxygen Heart S1S2 no rub Abdomen soft nontender non-distended Extremities no pitting edema  Neuro patient states doesn't know the year; oriented to person Psych yelling out in pain  GU foley catheter in place   Medications reviewed   Labs:     Latest Ref Rng & Units 02/05/2024    9:29 AM 02/04/2024    3:18 AM 02/03/2024    8:13 PM  BMP  Glucose 70 - 99 mg/dL 882  98    BUN 8 - 23 mg/dL 879  873    Creatinine 0.61 - 1.24 mg/dL 4.65  4.68  4.82   Sodium 135 -  145 mmol/L 146  140    Potassium 3.5 - 5.1 mmol/L 4.7  4.9    Chloride 98 - 111 mmol/L 115  106    CO2 22 - 32 mmol/L 16  20    Calcium  8.9 - 10.3 mg/dL 8.6  8.8       Assessment/Plan:      # AKI  - May be cardiorenal vs CKD progression.  Setting of multiple leg wounds, as well  - Note pharmacy is consulted for dosing of vanc  -  Continue supportive care and follow trends  - NS at 75 ml/hr x 24 hours  - Continue Foley catheter  - I will speak with the patient and his wife again tomorrow about his goals of care with regard to his renal function - May need to decrease dosing frequency of ampicillin -sulbactam to daily  - He has no absolute contraindications to dialysis but does seem to be a marginal candidate    # CKD stage IV  - note baseline Cr is 2.5 - 3.0  - Secondary to diabetic nephropathy and microvascular disease from HTN     # Urinary retention  - Elevated residuals  - Continue foley catheter     # Chronic combined systolic and diastolic congestive heart failure - Pause lasix  and assess diuretic needs daily      # HTN  - Acceptable on current regimen    # Metabolic acidosis - Setting of AKI - Start sodium bicarbonate   # Anemia of CKD - Macrocytic anemia  - Concern for nutrient deficiency - Iron  is replete  - B12 and folate ok this summer - aranesp  40 mcg once on 9/18   # Hypernatremia  - Free water  deficit  - Start enteric free water      Disposition - continue inpatient monitoring     Katheryn JAYSON Saba, MD 02/05/2024  2:50 PM

## 2024-02-06 DIAGNOSIS — Z8679 Personal history of other diseases of the circulatory system: Secondary | ICD-10-CM | POA: Diagnosis not present

## 2024-02-06 DIAGNOSIS — I10 Essential (primary) hypertension: Secondary | ICD-10-CM | POA: Diagnosis not present

## 2024-02-06 DIAGNOSIS — N184 Chronic kidney disease, stage 4 (severe): Secondary | ICD-10-CM | POA: Diagnosis not present

## 2024-02-06 DIAGNOSIS — I5033 Acute on chronic diastolic (congestive) heart failure: Secondary | ICD-10-CM | POA: Diagnosis not present

## 2024-02-06 LAB — RENAL FUNCTION PANEL
Albumin: 2.5 g/dL — ABNORMAL LOW (ref 3.5–5.0)
Anion gap: 15 (ref 5–15)
BUN: 121 mg/dL — ABNORMAL HIGH (ref 8–23)
CO2: 17 mmol/L — ABNORMAL LOW (ref 22–32)
Calcium: 8.3 mg/dL — ABNORMAL LOW (ref 8.9–10.3)
Chloride: 114 mmol/L — ABNORMAL HIGH (ref 98–111)
Creatinine, Ser: 5.1 mg/dL — ABNORMAL HIGH (ref 0.61–1.24)
GFR, Estimated: 11 mL/min — ABNORMAL LOW (ref >60)
Glucose, Bld: 120 mg/dL — ABNORMAL HIGH (ref 70–99)
Phosphorus: 4.7 mg/dL — ABNORMAL HIGH (ref 2.5–4.6)
Potassium: 4.5 mmol/L (ref 3.5–5.1)
Sodium: 146 mmol/L — ABNORMAL HIGH (ref 135–145)

## 2024-02-06 LAB — CBC
HCT: 27.5 % — ABNORMAL LOW (ref 39.0–52.0)
Hemoglobin: 8.5 g/dL — ABNORMAL LOW (ref 13.0–17.0)
MCH: 33.7 pg (ref 26.0–34.0)
MCHC: 30.9 g/dL (ref 30.0–36.0)
MCV: 109.1 fL — ABNORMAL HIGH (ref 80.0–100.0)
Platelets: 122 K/uL — ABNORMAL LOW (ref 150–400)
RBC: 2.52 MIL/uL — ABNORMAL LOW (ref 4.22–5.81)
RDW: 26 % — ABNORMAL HIGH (ref 11.5–15.5)
WBC: 9.4 K/uL (ref 4.0–10.5)
nRBC: 0 % (ref 0.0–0.2)

## 2024-02-06 LAB — GLUCOSE, CAPILLARY
Glucose-Capillary: 123 mg/dL — ABNORMAL HIGH (ref 70–99)
Glucose-Capillary: 114 mg/dL — ABNORMAL HIGH (ref 70–99)
Glucose-Capillary: 108 mg/dL — ABNORMAL HIGH (ref 70–99)
Glucose-Capillary: 136 mg/dL — ABNORMAL HIGH (ref 70–99)

## 2024-02-06 NOTE — Progress Notes (Signed)
 Progress Note   Patient: David [name] FMW:[id number] DOB: Dec 08, 1945 DOA: 02/03/2024     3 DOS: the patient was seen and examined on 02/06/2024   Brief hospital course: Mr. Geraci was admitted to the hospital with the working diagnosis of volume overload, in the setting of progressive renal disease.   78 yo male with the past medical history of heart failure,, hypertension, CKD stage IV, chronic anemia, coronary artery disease, T2DM and chronic foot ulcers, who presented with dyspnea. Patient was transferred from SNF for 2 weeks of worsening dyspnea. On his initial physical examination his blood pressure was 140/83, HR 58, RR 16 and 02 saturation 98%,  Lungs with no wheezing or rhonchi, heart with S1 and S2 present and regular, no rubs, abdomen with no distention and trace lower extremity edema.  Bilateral foot ulcers.   Na 140, K 4.9 Cl 110, bicarbonate 18, glucose 123, bun 120 cr 5.0  BNP 704  Lactic acid 1,3 Wbc 7.7 hgb 9.6 plt 155   09/19 worsening renal function, patient marginal candidate for renal replacement therapy.  Will need dialysis prior to left foot osteomyelitis amputation.  09/20 advanced renal failure, not clear if patient is candidate for renal replacement therapy.   Assessment and Plan: * Acute on chronic diastolic CHF (congestive heart failure) (HCC) Echocardiogram with reduced LV systolic function EF 35 to 40%, mild LVH, grade II diastolic dysfunction (pseudo normalization EA), RV with mild reduction in systolic function, RVSP 89.3 mmHg, LA with mild dilatation, mild MR, mild TR.   Limited medical therapy due to acutely reduced GFR. Continue blood pressure monitoring.   Essential hypertension Continue blood pressure monitoring.   History of CAD (coronary artery disease) No chest pain, no acute coronary syndrome   CKD (chronic kidney disease) stage 4, GFR 15-29 ml/min (HCC) Hypernatremia.  Advanced renal disease with serum cr today at 5.10 with K at 4.5  and serum bicarbonate at 17  Na 146 and p 4.7  Foley catheter has been placed with urine output of 800 cc   Very poor oral intake.  Acute metabolic encephalopathy, possible mild uremic symptoms, I suspect patient may have baseline cognitive impairment.   Continue supportive medical care.  Holding on loop diuretic for now.  May need renal replacement therapy, likely elevated risk for complications due to heart failure and elevated pulmonary pressures.  Patient not ambulatory at SNF  Follow up renal function in am. Follow up with nephrology recommendations.   PAD (peripheral artery disease) (HCC) Continue blood pressure monitoring   Pressure injury of skin Left heal pressure ulcer, present on admission, not able to stage.   Foot osteomyelitis, right (HCC) MRI with osteomyelitis of the fifth proximal phalanx and fifth metatarsal head and neck with overlying soft tissue ulceration at the lateral plantar forefoot. No loculated fluid collection.   Continue antibiotic therapy with linezolid  and unasyn .  Orthopedics consulted, patient will need renal replacement therapy before considering surgery.   Type 2 diabetes mellitus (HCC) Continue insulin  sliding scale for glucose cover and monitoring         Subjective: Patient with no apparent chest pain or dyspnea, he has been poorly reactive, confused and disorientated.   Physical Exam: Vitals:   02/06/24 0611 02/06/24 0744 02/06/24 1125 02/06/24 1510  BP: 119/68 115/71 120/70 119/71  Pulse: 64 71 (!) 55 68  Resp: 18 14 16 12   Temp: 98.6 F (37 C) 97.7 F (36.5 C) 98.1 F (36.7 C) 98.1 F (36.7 C)  TempSrc:  Oral Oral Oral Oral  SpO2: 97% 94% 94%   Weight:        Neurology eyes closed, not interactive, repeating words.  ENT with mild pallor Cardiovascular with S1 and S2 present and regular with no gallops or rubs, no murmurs Positive JVD Respiratory with bilateral rales with no wheezing, scattered rhonchi  Abdomen with no  distention, soft and non tender Positive lower extremity edema ++ Feet with dressing in place   Data Reviewed:    Family Communication: his wife is at the bedside.  Disposition: Status is: Inpatient Remains inpatient appropriate because: renal failure and osteomyelitis   Planned Discharge Destination: Skilled nursing facility     Author: Elidia Toribio Furnace, MD 02/06/2024 4:55 PM  For on call review [email or web address].

## 2024-02-06 NOTE — Progress Notes (Signed)
 [facility] Progress Note  Name: TAEVON ASCHOFF MRN: [id number] DOB: [birth year]  Chief Complaint:  Shortness of breath   Subjective:  He had 800 mL UOP over 9/19.  Spoke with primary team.  His team called his daughter yesterday and she has concerns about dementia in the patient as well as his spouse.  His daughter is coming to visit in October.  Spoke with his wife at bedside; she states that she is worried about losing her memory as memory loss runs in her family.   I called his daughter and got a Engineer, technical sales.    Review of systems:    He is short of breath  Denies chest pain  He has had nausea without vomiting  Pain of his backside   ----------------- Background on consult & notable interim history: DENHAM MOSE is a 78 y.o. male with a history of chronic kidney disease, coronary artery disease, chronic combined systolic and diastolic congestive heart failure, type 2 diabetes mellitus and hypertension who presented to the hospital from his SNF with worsening shortness of breath.  His baseline creatinine per review of labs below is approximately 2.5-3.  Nephrology is consulted for assistance with management of AKI.  The ER ordered lasix  40 mg IV once.  There is no urine output recorded.  He does not appear to be on any diuretics as an outpatient per his med rec.  Note that on chart review, palliative care has seen him on a prior admission and had recommended outpatient palliative support.  Nephrology has seen him during prior admission (at least in 08/2023).  He had labs with Dr. Gearline at [facility] in 08/2019 but no more recent labs are available in [facility] suggesting he was lost to follow-up. He does not recall.  He doesn't recall seeing palliative care previously.  Breathing is better than when he came in.  His abdomen feels a little distended to him.  He doesn't know where he would usually build up fluid.  He's on oxygen at the SNF.  I asked what happened to his  foot and he states diabetes.  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     Per his wife, he will frequently wait until the last minute and he likes to avoid things that are unpleasant.  She states that he doesn't go to the doctor much.  He saw Dr. Gearline once at [facility] in 09/2019 then was lost to follow-up; Cr was 1.89 at that time.  Note with regard to functional status, his wife states that he has not walked in several years.  Gets out of bed to the chair.       Intake/Output Summary (Last 24 hours) at 02/06/2024 1629 Last data filed at 02/06/2024 1614 Gross per 24 hour  Intake 2560.01 ml  Output 800 ml  Net 1760.01 ml    Vitals:  Vitals:   02/06/24 0611 02/06/24 0744 02/06/24 1125 02/06/24 1510  BP: 119/68 115/71 120/70 119/71  Pulse: 64 71 (!) 55 68  Resp: 18 14 16 12   Temp: 98.6 F (37 C) 97.7 F (36.5 C) 98.1 F (36.7 C) 98.1 F (36.7 C)  TempSrc: Oral Oral Oral Oral  SpO2: 97% 94% 94%   Weight:         Physical Exam:     General adult male in bed in no acute distress HEENT normocephalic atraumatic extraocular movements intact sclera anicteric Neck supple trachea midline Lungs clear  to auscultation bilaterally normal work of breathing at rest on 3 liters oxygen Heart S1S2 no rub Abdomen soft nontender non-distended Extremities no pitting edema  Neuro patient states doesn't know the year; oriented to person Psych yells out the answers to some questions GU foley catheter in place   Medications reviewed   Labs:     Latest Ref Rng & Units 02/06/2024    8:32 AM 02/05/2024    9:29 AM 02/04/2024    3:18 AM  BMP  Glucose 70 - 99 mg/dL 879  882  98   BUN 8 - 23 mg/dL 878  879  873   Creatinine 0.61 - 1.24 mg/dL 4.89  4.65  4.68   Sodium 135 - 145 mmol/L 146  146  140   Potassium 3.5 - 5.1 mmol/L 4.5  4.7  4.9   Chloride 98 - 111 mmol/L 114  115  106   CO2 22 - 32 mmol/L 17  16  20    Calcium  8.9 - 10.3 mg/dL 8.3  8.6  8.8       Assessment/Plan:      # AKI  - May be AKI vs CKD progression.  Setting of multiple leg wounds, as well  - Continue supportive care and follow trends  - Continue Foley catheter   - I will try to call his daughter again tomorrow - May need to decrease dosing frequency of ampicillin -sulbactam to daily  - He has no absolute contraindications to dialysis but does seem to be a marginal candidate given his functional status.  I question how well that he would be able to tolerate sitting in a chair for 4 hour HD treatments   # CKD stage IV  - note baseline Cr is 2.5 - 3.0  - Secondary to diabetic nephropathy and microvascular disease from HTN     # Urinary retention  - Elevated residuals  - Continue foley catheter     # Chronic combined systolic and diastolic congestive heart failure - Pause lasix  and assess diuretic needs daily      # HTN  - Acceptable on current regimen    # Metabolic acidosis - Setting of AKI - on sodium bicarbonate   # Anemia of CKD - Macrocytic anemia  - Concern for nutrient deficiency - Iron  is replete  - B12 and folate ok this summer - aranesp  40 mcg once on 9/18   # Hypernatremia  - Free water  deficit  - on enteric free water      Disposition - continue inpatient monitoring     Katheryn JAYSON Saba, MD 02/06/2024  4:47 PM

## 2024-02-07 DIAGNOSIS — E785 Hyperlipidemia, unspecified: Secondary | ICD-10-CM

## 2024-02-07 DIAGNOSIS — E1169 Type 2 diabetes mellitus with other specified complication: Secondary | ICD-10-CM

## 2024-02-07 DIAGNOSIS — F32A Depression, unspecified: Secondary | ICD-10-CM

## 2024-02-07 DIAGNOSIS — N184 Chronic kidney disease, stage 4 (severe): Secondary | ICD-10-CM | POA: Diagnosis not present

## 2024-02-07 DIAGNOSIS — Z8679 Personal history of other diseases of the circulatory system: Secondary | ICD-10-CM | POA: Diagnosis not present

## 2024-02-07 DIAGNOSIS — I10 Essential (primary) hypertension: Secondary | ICD-10-CM | POA: Diagnosis not present

## 2024-02-07 DIAGNOSIS — I5033 Acute on chronic diastolic (congestive) heart failure: Secondary | ICD-10-CM | POA: Diagnosis not present

## 2024-02-07 LAB — RENAL FUNCTION PANEL
Albumin: 2.4 g/dL — ABNORMAL LOW (ref 3.5–5.0)
Anion gap: 16 — ABNORMAL HIGH (ref 5–15)
BUN: 122 mg/dL — ABNORMAL HIGH (ref 8–23)
CO2: 15 mmol/L — ABNORMAL LOW (ref 22–32)
Calcium: 8.1 mg/dL — ABNORMAL LOW (ref 8.9–10.3)
Chloride: 114 mmol/L — ABNORMAL HIGH (ref 98–111)
Creatinine, Ser: 5.69 mg/dL — ABNORMAL HIGH (ref 0.61–1.24)
GFR, Estimated: 10 mL/min — ABNORMAL LOW (ref >60)
Glucose, Bld: 126 mg/dL — ABNORMAL HIGH (ref 70–99)
Phosphorus: 5.3 mg/dL — ABNORMAL HIGH (ref 2.5–4.6)
Potassium: 4.3 mmol/L (ref 3.5–5.1)
Sodium: 145 mmol/L (ref 135–145)

## 2024-02-07 LAB — CBC
HCT: 27.3 % — ABNORMAL LOW (ref 39.0–52.0)
Hemoglobin: 8.1 g/dL — ABNORMAL LOW (ref 13.0–17.0)
MCH: 33.3 pg (ref 26.0–34.0)
MCHC: 29.7 g/dL — ABNORMAL LOW (ref 30.0–36.0)
MCV: 112.3 fL — ABNORMAL HIGH (ref 80.0–100.0)
Platelets: 113 K/uL — ABNORMAL LOW (ref 150–400)
RBC: 2.43 MIL/uL — ABNORMAL LOW (ref 4.22–5.81)
RDW: 26.4 % — ABNORMAL HIGH (ref 11.5–15.5)
WBC: 9.4 K/uL (ref 4.0–10.5)
nRBC: 0 % (ref 0.0–0.2)

## 2024-02-07 LAB — GLUCOSE, CAPILLARY
Glucose-Capillary: 119 mg/dL — ABNORMAL HIGH (ref 70–99)
Glucose-Capillary: 124 mg/dL — ABNORMAL HIGH (ref 70–99)
Glucose-Capillary: 120 mg/dL — ABNORMAL HIGH (ref 70–99)
Glucose-Capillary: 149 mg/dL — ABNORMAL HIGH (ref 70–99)

## 2024-02-07 LAB — HEPATITIS B SURFACE ANTIGEN: Hepatitis B Surface Ag: NONREACTIVE

## 2024-02-07 MED ORDER — TAMSULOSIN HCL 0.4 MG PO CAPS
0.4000 mg | ORAL_CAPSULE | Freq: Every day | ORAL | Status: DC
  Administered 2024-02-07: 0.4 mg via ORAL
  Filled 2024-02-07: qty 1

## 2024-02-07 MED ORDER — SODIUM CHLORIDE 0.9 % IV SOLN
1.5000 g | INTRAVENOUS | Status: DC
  Administered 2024-02-08: 1.5 g via INTRAVENOUS
  Filled 2024-02-07: qty 4

## 2024-02-07 MED ORDER — CHLORHEXIDINE GLUCONATE CLOTH 2 % EX PADS
6.0000 | MEDICATED_PAD | Freq: Every day | CUTANEOUS | Status: DC
Start: 2024-02-08 — End: 2024-02-16
  Administered 2024-02-08 – 2024-02-12 (×3): 6 via TOPICAL

## 2024-02-07 MED ORDER — SODIUM BICARBONATE 650 MG PO TABS
1300.0000 mg | ORAL_TABLET | Freq: Two times a day (BID) | ORAL | Status: DC
  Administered 2024-02-07: 1300 mg via ORAL
  Filled 2024-02-07 (×2): qty 2

## 2024-02-07 MED ORDER — DARBEPOETIN ALFA 60 MCG/0.3ML IJ SOSY
60.0000 ug | PREFILLED_SYRINGE | INTRAMUSCULAR | Status: DC
  Administered 2024-02-11: 60 ug via SUBCUTANEOUS
  Filled 2024-02-07: qty 0.3

## 2024-02-07 NOTE — Evaluation (Signed)
 Clinical/Bedside Swallow Evaluation Patient Details  Name: David [name] MRN: [id number] Date of Birth: 10/19/1945  Today's Date: 02/07/2024 Time: SLP Start Time (ACUTE ONLY): 1457 SLP Stop Time (ACUTE ONLY): 1525 SLP Time Calculation (min) (ACUTE ONLY): 28 min  Past Medical History:  Past Medical History:  Diagnosis Date   Anemia    Arthritis    CAD in native artery    s/p stent in 11/18   CHF (congestive heart failure) (HCC)    normal echo in 11/18   CKD (chronic kidney disease) stage 3, GFR 30-59 ml/min (HCC)    DDD (degenerative disc disease), cervical    Diabetes mellitus with complication (HCC)    Type II   Diabetic neuropathy (HCC)    Diabetic retinopathy (HCC)    GERD (gastroesophageal reflux disease)    Glaucoma    Hypertension    Past Surgical History:  Past Surgical History:  Procedure Laterality Date   AMPUTATION Right 07/09/2016   Procedure: Right Great Toe Amputation at Metatarsophalangeal Joint;  Surgeon: Jerona Harden GAILS, MD;  Location: [facility] OR;  Service: Orthopedics;  Laterality: Right;   AMPUTATION Right 05/28/2018   Procedure: RIGHT SECOND TOE AMPUTATION;  Surgeon: Harden Jerona GAILS, MD;  Location: [facility] OR;  Service: Orthopedics;  Laterality: Right;   AMPUTATION Left 08/03/2020   Procedure: LEFT 5TH RAY AMPUTATION;  Surgeon: Harden Jerona GAILS, MD;  Location: [facility] OR;  Service: Orthopedics;  Laterality: Left;   BACK SURGERY     4   CARDIAC CATHETERIZATION     CORONARY STENT INTERVENTION N/A 04/06/2017   Procedure: CORONARY STENT INTERVENTION;  Surgeon: Elmira Newman PARAS, MD;  Location: [facility] INVASIVE CV LAB;  Service: Cardiovascular;  Laterality: N/A;   CORONARY STENT INTERVENTION N/A 04/07/2017   Procedure: CORONARY STENT INTERVENTION;  Surgeon: Elmira Newman PARAS, MD;  Location: [facility] INVASIVE CV LAB;  Service: Cardiovascular;  Laterality: N/A;   CYST EXCISION     on Back   ENDOTRACHEAL INTUBATION EMERGENT  08/07/2018       ESOPHAGOGASTRODUODENOSCOPY Left 11/07/2023    Procedure: EGD (ESOPHAGOGASTRODUODENOSCOPY);  Surgeon: Rosalie Kitchens, MD;  Location: [facility];  Service: Gastroenterology;  Laterality: Left;   JOINT REPLACEMENT     LAPAROSCOPIC ABDOMINAL EXPLORATION N/A 11/26/2017   Procedure: LAPAROSCOPIC ABDOMINAL EXPLORATION, DRAINAGE OF APPENDICEAL ABCESS. PLACEMENT OF DRAIN;  Surgeon: Ethyl Lenis, MD;  Location: [facility] OR;  Service: General;  Laterality: N/A;   RIGHT/LEFT HEART CATH AND CORONARY ANGIOGRAPHY N/A 04/06/2017   Procedure: RIGHT/LEFT HEART CATH AND CORONARY ANGIOGRAPHY;  Surgeon: Elmira Newman PARAS, MD;  Location: [facility]VAS[facility] LAB;  Service: Cardiovascular;  Laterality: N/A;   TOE SURGERY  05/2018   Left    TOTAL HIP ARTHROPLASTY     Right    HPI:  Pt is a 78 y.o. male who presented from skilled facility for worsening shortness of breath for past 2 weeks. CXR 02/03/24: Cardiomegaly with vascular congestion and basilar atelectasis, possible small bilateral pleural effusions.PMH: combined systolic and diastolic CHF with reduced EF of 30 to 35%, essential hypertension, CKD stage IV, chronic microcytic anemia, CAD s/p coronary stents, diabetes mellitus type 2, chronic diabetic foot ulcers in bilateral lower extremities. BSE 08/11/18:  normal swallow function; regular texture diet and thin liquids recommended.    Assessment / Plan / Recommendation  Clinical Impression  Pt was seen for bedside swallow evaluation with his wife present who reported that she believes he typically consumes regular texture solids and thin liquids. [facility], SNF was contacted by SLP,  but the nurses were unavailable. Pt did not provide information regarding his history despite SLP's inquiries. Oral mechanism exam was limited due to pt's difficulty following commands. Pt was masticating upon SLP's arrival- pt's wife stated that it was the tuna salad sandwich from lunch >15 mins prior. Oral inspection revealed moderate to severe oral residue in the right lateral sulcus and  on his superior lingual surface; masticated tuna salad was removed by SLP. Pt exhibited symptoms of oropharyngeal dysphagia characterized by anterior spillage, reduced bolus awareness with oral holding and limited mastication, prolonged bolus manipulation, suspected oral/pharyngeal delay, and signs of aspiration with thin liquids via cup and straw, and with nectar thick liquids via straw. Pt's swallow function improved slightly with prompts to attend to boluses. A dysphagia 1 diet with nectar thick liquids is recommended at this time with full supervision and strict observance of swallowing precautions. Pt's wife reported that the pt has not had difficulty swallowing until today and attributed his symptoms to him not liking what he was given. SLP will follow to assess improvement, but should his symptoms persist, a modified barium swallow study is recommended to further investigate swallow function. SLP Visit Diagnosis: Dysphagia, unspecified (R13.10)    Aspiration Risk  Moderate aspiration risk    Diet Recommendation Dysphagia 1 (Puree);Nectar-thick liquid    Liquid Administration via: Cup;No straw Medication Administration: Crushed with puree Supervision: Full supervision/cueing for compensatory strategies Compensations: Slow rate;Small sips/bites;Follow solids with liquid Postural Changes: Seated upright at 90 degrees    Other  Recommendations Oral Care Recommendations: Oral care BID     Assistance Recommended at Discharge    Functional Status Assessment Patient has had a recent decline in their functional status and demonstrates the ability to make significant improvements in function in a reasonable and predictable amount of time.  Frequency and Duration min 2x/week  2 weeks       Prognosis Prognosis for improved oropharyngeal function: Fair Barriers to Reach Goals: Cognitive deficits;Severity of deficits      Swallow Study   General Date of Onset: 02/06/24 HPI: Pt is a 78 y.o.  male who presented from skilled facility for worsening shortness of breath for past 2 weeks. CXR 02/03/24: Cardiomegaly with vascular congestion and basilar atelectasis, possible small bilateral pleural effusions.PMH: combined systolic and diastolic CHF with reduced EF of 30 to 35%, essential hypertension, CKD stage IV, chronic microcytic anemia, CAD s/p coronary stents, diabetes mellitus type 2, chronic diabetic foot ulcers in bilateral lower extremities. BSE 08/11/18:  normal swallow function; regular texture diet and thin liquids recommended. Type of Study: Bedside Swallow Evaluation Previous Swallow Assessment: none Diet Prior to this Study: Regular;Thin liquids (Level 0) Temperature Spikes Noted: No Respiratory Status: Room air History of Recent Intubation: No Behavior/Cognition: Lethargic/Drowsy;Doesn't follow directions;Requires cueing;Cooperative Oral Cavity Assessment:  (oral residue) Oral Care Completed by SLP: Yes Oral Cavity - Dentition: Adequate natural dentition Vision: Functional for self-feeding Self-Feeding Abilities: Total assist Patient Positioning: Upright in bed;Postural control adequate for testing Baseline Vocal Quality: Normal Volitional Cough: Cognitively unable to elicit Volitional Swallow: Unable to elicit    Oral/Motor/Sensory Function Overall Oral Motor/Sensory Function:  (difficult to assess)   Ice Chips Ice chips: Impaired Presentation: Spoon Oral Phase Impairments: Poor awareness of bolus Oral Phase Functional Implications: Oral holding;Left anterior spillage;Prolonged oral transit   Thin Liquid Thin Liquid: Impaired Presentation: Cup;Straw Oral Phase Impairments: Reduced labial seal;Poor awareness of bolus Oral Phase Functional Implications: Left anterior spillage;Oral holding;Prolonged oral transit Pharyngeal  Phase Impairments: Cough -  Immediate;Cough - Delayed;Throat Clearing - Immediate    Nectar Thick Nectar Thick Liquid: Impaired Presentation:  Cup;Straw Oral Phase Impairments: Poor awareness of bolus Oral phase functional implications: Oral holding;Left anterior spillage;Prolonged oral transit Pharyngeal Phase Impairments: Cough - Immediate (via straw)   Honey Thick Honey Thick Liquid: Not tested   Puree Puree: Impaired Presentation: Spoon Oral Phase Impairments: Poor awareness of bolus Oral Phase Functional Implications: Prolonged oral transit;Oral holding   Solid     Solid: Impaired Oral Phase Impairments: Poor awareness of bolus;Impaired mastication;Reduced lingual movement/coordination Oral Phase Functional Implications: Impaired mastication;Oral residue;Prolonged oral transit     Dink Creps I. [place], MS, CCC-SLP Acute Rehabilitation Services Office number [phone]  Thea LILLETTE [place] 02/07/2024,4:09 PM

## 2024-02-07 NOTE — Assessment & Plan Note (Signed)
 Continue with sertraline   I suspect patient has baseline cognitive impairment.

## 2024-02-07 NOTE — Progress Notes (Signed)
 [facility] Progress Note  Name: David [name] MRN: [id number] DOB: 27-Oct-1945  Chief Complaint:  Shortness of breath   Subjective:  There is no urine output documented over 9/20.  Today, his wife states that her memory is alright.  His team called his daughter on 9/19 and she had concerns about memory loss in the patient's spouse.  I called his daughter today.  His daughter is coming to visit in October.  His daughter states that she has power of attorney for her dad.  She does not have any concerns about his memory at his baseline - this is new and different.  He is hoping to become more mobile - he is seeing therapy at facility.     Review of systems:    Unable to obtain secondary to patient factors - he does not answer questions    ----------------- Background on consult & notable interim history: David [name] is a 78 y.o. male with a history of chronic kidney disease, coronary artery disease, chronic combined systolic and diastolic congestive heart failure, type 2 diabetes mellitus and hypertension who presented to the hospital from his SNF with worsening shortness of breath.  His baseline creatinine per review of labs below is approximately 2.5-3.  Nephrology is consulted for assistance with management of AKI.  The ER ordered lasix  40 mg IV once.  There is no urine output recorded.  He does not appear to be on any diuretics as an outpatient per his med rec.  Note that on chart review, palliative care has seen him on a prior admission and had recommended outpatient palliative support.  Nephrology has seen him during prior admission (at least in 08/2023).  He had labs with Dr. Gearline at [facility] in 08/2019 but no more recent labs are available in [facility] suggesting he was lost to follow-up. He does not recall.  He doesn't recall seeing palliative care previously.  Breathing is better than when he came in.  His abdomen feels a little distended to him.  He doesn't  know where he would usually build up fluid.  He's on oxygen at the SNF.  I asked what happened to his foot and he states diabetes.  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     Per his wife, he will frequently wait until the last minute and he likes to avoid things that are unpleasant.  She states that he doesn't go to the doctor much.  He saw Dr. Gearline once at [facility] in 09/2019 then was lost to follow-up; Cr was 1.89 at that time.  Note with regard to functional status, his wife states that he has not walked in several years.  Gets out of bed to the chair.       Intake/Output Summary (Last 24 hours) at 02/07/2024 1518 Last data filed at 02/06/2024 1614 Gross per 24 hour  Intake 120 ml  Output --  Net 120 ml    Vitals:  Vitals:   02/07/24 0225 02/07/24 0616 02/07/24 0731 02/07/24 1205  BP: (!) 95/58  113/64 106/82  Pulse: 66  70 66  Resp: 20  18   Temp: 97.6 F (36.4 C)  98.4 F (36.9 C) 97.6 F (36.4 C)  TempSrc: Oral  Oral Axillary  SpO2: 99%  100% 100%  Weight:  97.1 kg       Physical Exam:      General adult male in bed in no acute  distress HEENT normocephalic atraumatic extraocular movements intact sclera anicteric Neck supple trachea midline Lungs clear to auscultation bilaterally normal work of breathing at rest on 3 liters oxygen Heart S1S2 no rub Abdomen soft nontender non-distended Extremities no pitting edema  Neuro patient states doesn't know the year; oriented to person GU foley catheter in place with amber urine   Medications reviewed   Labs:     Latest Ref Rng & Units 02/07/2024    9:43 AM 02/06/2024    8:32 AM 02/05/2024    9:29 AM  BMP  Glucose 70 - 99 mg/dL 873  879  882   BUN 8 - 23 mg/dL 877  878  879   Creatinine 0.61 - 1.24 mg/dL 4.30  4.89  4.65   Sodium 135 - 145 mmol/L 145  146  146   Potassium 3.5 - 5.1 mmol/L 4.3  4.5  4.7   Chloride 98 - 111 mmol/L 114  114  115   CO2 22 - 32 mmol/L 15  17  16     Calcium  8.9 - 10.3 mg/dL 8.1  8.3  8.6      Assessment/Plan:      # AKI  - May be AKI vs CKD progression.  Setting of multiple leg wounds, as well  - Continue Foley catheter   - If BP drops, may need midodrine - I spoke with his daughter on 9/21 as above.  She does want to pursue HD  - Start HD on 9/22 after tunneled catheter placed with IR  - NPO after midnight - Consult IR for tunneled dialysis catheter  - Would decrease dosing frequency of ampicillin -sulbactam to daily - reached out to primary team    # CKD stage IV  - note baseline Cr is 2.5 - 3.0  - Secondary to diabetic nephropathy and microvascular disease from HTN     # Urinary retention  - Elevated residuals  - Continue foley catheter - Start flomax      # Chronic combined systolic and diastolic congestive heart failure - Pause lasix  and assess diuretic needs daily     # HTN  - Acceptable on current regimen    # Metabolic acidosis - Setting of AKI - on sodium bicarbonate  for now - starting HD as above  # Anemia of CKD - Macrocytic anemia  - Concern for nutrient deficiency - Iron  is replete  - B12 and folate ok this summer - s/p aranesp  40 mcg once on 9/18 - will increase to aranesp  60 mcg weekly on Thursdays   # Hypernatremia  - Free water  deficit  - on enteric free water   - improving    Disposition - continue inpatient monitoring     Katheryn JAYSON Saba, MD 02/07/2024  4:00 PM

## 2024-02-07 NOTE — Progress Notes (Signed)
 Progress Note   Patient: David [name] FMW:[id number] DOB: [birth year] DOA: 02/03/2024     4 DOS: the patient was seen and examined on 02/07/2024   Brief hospital course: Mr. Krakow was admitted to the hospital with the working diagnosis of volume overload, in the setting of progressive renal disease.   78 yo male with the past medical history of heart failure,, hypertension, CKD stage IV, chronic anemia, coronary artery disease, T2DM and chronic foot ulcers, who presented with dyspnea. Patient was transferred from SNF for 2 weeks of worsening dyspnea. On his initial physical examination his blood pressure was 140/83, HR 58, RR 16 and 02 saturation 98%,  Lungs with no wheezing or rhonchi, heart with S1 and S2 present and regular, no rubs, abdomen with no distention and trace lower extremity edema.  Bilateral foot ulcers.   Na 140, K 4.9 Cl 110, bicarbonate 18, glucose 123, bun 120 cr 5.0  BNP 704  Lactic acid 1,3 Wbc 7.7 hgb 9.6 plt 155   09/19 worsening renal function, patient marginal candidate for renal replacement therapy.  Will need dialysis prior to left foot osteomyelitis amputation.  09/20 advanced renal failure, not clear if patient is candidate for renal replacement therapy.   Assessment and Plan: * Acute on chronic diastolic CHF (congestive heart failure) (HCC) Echocardiogram with reduced LV systolic function EF 35 to 40%, mild LVH, grade II diastolic dysfunction (pseudo normalization EA), RV with mild reduction in systolic function, RVSP 89.3 mmHg, LA with mild dilatation, mild MR, mild TR.   Limited medical therapy due to acutely reduced GFR. Continue blood pressure monitoring.   Essential hypertension Continue blood pressure monitoring.   History of CAD (coronary artery disease) No chest pain, no acute coronary syndrome   CKD (chronic kidney disease) stage 4, GFR 15-29 ml/min (HCC) Hypernatremia.  Advanced renal disease pending blood work for this morning.   Foley catheter in place.   Very poor oral intake.  Acute metabolic encephalopathy, possible mild uremic symptoms, I suspect patient may have baseline cognitive impairment.   Continue supportive medical care.  Holding on loop diuretic for now.  May need renal replacement therapy, likely elevated risk for complications due to heart failure and elevated pulmonary pressures.  Poor mobility, question if will tolerate HD.  Patient not ambulatory at SNF  Follow up renal function in am. Follow up with nephrology recommendations.   PAD (peripheral artery disease) (HCC) Continue blood pressure monitoring   Pressure injury of skin Left heal pressure ulcer, present on admission, not able to stage.   Foot osteomyelitis, right (HCC) MRI with osteomyelitis of the fifth proximal phalanx and fifth metatarsal head and neck with overlying soft tissue ulceration at the lateral plantar forefoot. No loculated fluid collection.   Continue antibiotic therapy with linezolid  and unasyn .  Orthopedics consulted, patient will need renal replacement therapy before considering surgery.   Type 2 diabetes mellitus with hyperlipidemia (HCC) Glucose has been stable, poor oral intake.  Fasting glucose capillary today was 119 mg/dl  Plan to hold on insulin  therapy and will do capillary glucose checks as needed.  Continue with statin therapy.   Depression Continue with sertraline   I suspect patient has baseline cognitive impairment.      Subjective: patient continue very weak and deconditioned, very poor oral intake, not much communicative   Physical Exam: Vitals:   02/06/24 2317 02/07/24 0225 02/07/24 0616 02/07/24 0731  BP: 90/63 (!) 95/58  113/64  Pulse: 65 66  70  Resp: 20 20  18  Temp: (!) 97 F (36.1 C) 97.6 F (36.4 C)  98.4 F (36.9 C)  TempSrc: Oral Oral  Oral  SpO2: 95% 99%  100%  Weight:   97.1 kg    Neurology somnolent, opens eyes to touch, not meaningful verbal response.  ENT with  mild pallor Cardiovascular with S1 and S2 present and regular with no gallops, rubs or murmurs Respiratory with no rales or wheezing, no rhonchi, anterior auscultation with poor inspiratory effort Abdomen with no distention, soft and non tender Lower extremity edema ++ Fett with dressing in place, protective boots in place   Data Reviewed:    Family Communication: his wife is at the bedside   Disposition: Status is: Inpatient Remains inpatient appropriate because: pending decision about renal replacement.   Planned Discharge Destination: Skilled nursing facility     Author: Elidia Toribio Furnace, MD 02/07/2024 10:14 AM  For on call review [email or web address].

## 2024-02-07 NOTE — Plan of Care (Signed)
  Problem: Education: Goal: Knowledge of General Education information will improve Description: Including pain rating scale, medication(s)/side effects and non-pharmacologic comfort measures Outcome: Not Progressing   Problem: Health Behavior/Discharge Planning: Goal: Ability to manage health-related needs will improve Outcome: Not Progressing   Problem: Clinical Measurements: Goal: Ability to maintain clinical measurements within normal limits will improve Outcome: Not Progressing Goal: Will remain free from infection Outcome: Not Progressing Goal: Diagnostic test results will improve Outcome: Not Progressing Goal: Respiratory complications will improve Outcome: Not Progressing Goal: Cardiovascular complication will be avoided Outcome: Not Progressing   Problem: Activity: Goal: Risk for activity intolerance will decrease Outcome: Not Progressing   Problem: Nutrition: Goal: Adequate nutrition will be maintained Outcome: Not Progressing   Problem: Coping: Goal: Level of anxiety will decrease Outcome: Not Progressing   Problem: Elimination: Goal: Will not experience complications related to bowel motility Outcome: Not Progressing Goal: Will not experience complications related to urinary retention Outcome: Not Progressing   Problem: Pain Managment: Goal: General experience of comfort will improve and/or be controlled Outcome: Not Progressing   Problem: Safety: Goal: Ability to remain free from injury will improve Outcome: Not Progressing   Problem: Skin Integrity: Goal: Risk for impaired skin integrity will decrease Outcome: Not Progressing   Problem: Education: Goal: Ability to demonstrate management of disease process will improve Outcome: Not Progressing Goal: Ability to verbalize understanding of medication therapies will improve Outcome: Not Progressing Goal: Individualized Educational Video(s) Outcome: Not Progressing   Problem: Activity: Goal:  Capacity to carry out activities will improve Outcome: Not Progressing   Problem: Cardiac: Goal: Ability to achieve and maintain adequate cardiopulmonary perfusion will improve Outcome: Not Progressing   Problem: Education: Goal: Ability to describe self-care measures that may prevent or decrease complications (Diabetes Survival Skills Education) will improve Outcome: Not Progressing Goal: Individualized Educational Video(s) Outcome: Not Progressing   Problem: Coping: Goal: Ability to adjust to condition or change in health will improve Outcome: Not Progressing   Problem: Fluid Volume: Goal: Ability to maintain a balanced intake and output will improve Outcome: Not Progressing   Problem: Health Behavior/Discharge Planning: Goal: Ability to identify and utilize available resources and services will improve Outcome: Not Progressing Goal: Ability to manage health-related needs will improve Outcome: Not Progressing   Problem: Metabolic: Goal: Ability to maintain appropriate glucose levels will improve Outcome: Not Progressing   Problem: Nutritional: Goal: Maintenance of adequate nutrition will improve Outcome: Not Progressing Goal: Progress toward achieving an optimal weight will improve Outcome: Not Progressing   Problem: Skin Integrity: Goal: Risk for impaired skin integrity will decrease Outcome: Not Progressing   Problem: Tissue Perfusion: Goal: Adequacy of tissue perfusion will improve Outcome: Not Progressing

## 2024-02-08 ENCOUNTER — Inpatient Hospital Stay (HOSPITAL_COMMUNITY)

## 2024-02-08 ENCOUNTER — Inpatient Hospital Stay (HOSPITAL_COMMUNITY): Admitting: Anesthesiology

## 2024-02-08 ENCOUNTER — Encounter (HOSPITAL_COMMUNITY): Payer: Self-pay | Admitting: Family Medicine

## 2024-02-08 DIAGNOSIS — I5021 Acute systolic (congestive) heart failure: Secondary | ICD-10-CM | POA: Diagnosis not present

## 2024-02-08 DIAGNOSIS — I469 Cardiac arrest, cause unspecified: Secondary | ICD-10-CM

## 2024-02-08 DIAGNOSIS — N179 Acute kidney failure, unspecified: Secondary | ICD-10-CM

## 2024-02-08 DIAGNOSIS — I96 Gangrene, not elsewhere classified: Secondary | ICD-10-CM | POA: Diagnosis not present

## 2024-02-08 DIAGNOSIS — R579 Shock, unspecified: Secondary | ICD-10-CM

## 2024-02-08 DIAGNOSIS — I5033 Acute on chronic diastolic (congestive) heart failure: Secondary | ICD-10-CM | POA: Diagnosis not present

## 2024-02-08 DIAGNOSIS — D638 Anemia in other chronic diseases classified elsewhere: Secondary | ICD-10-CM

## 2024-02-08 DIAGNOSIS — N19 Unspecified kidney failure: Secondary | ICD-10-CM

## 2024-02-08 DIAGNOSIS — J9621 Acute and chronic respiratory failure with hypoxia: Secondary | ICD-10-CM | POA: Diagnosis not present

## 2024-02-08 HISTORY — PX: IR TUNNELED CENTRAL VENOUS CATH PLC W IMG: IMG1939

## 2024-02-08 LAB — POCT I-STAT 7, (LYTES, BLD GAS, ICA,H+H)
Acid-base deficit: 8 mmol/L — ABNORMAL HIGH (ref 0.0–2.0)
Bicarbonate: 17 mmol/L — ABNORMAL LOW (ref 20.0–28.0)
Calcium, Ion: 1.23 mmol/L (ref 1.15–1.40)
HCT: 26 % — ABNORMAL LOW (ref 39.0–52.0)
Hemoglobin: 8.8 g/dL — ABNORMAL LOW (ref 13.0–17.0)
O2 Saturation: 100 %
Patient temperature: 34.6
Potassium: 3.9 mmol/L (ref 3.5–5.1)
Sodium: 149 mmol/L — ABNORMAL HIGH (ref 135–145)
TCO2: 18 mmol/L — ABNORMAL LOW (ref 22–32)
pCO2 arterial: 29.9 mmHg — ABNORMAL LOW (ref 32–48)
pH, Arterial: 7.352 (ref 7.35–7.45)
pO2, Arterial: 338 mmHg — ABNORMAL HIGH (ref 83–108)

## 2024-02-08 LAB — CBC
HCT: 27.9 % — ABNORMAL LOW (ref 39.0–52.0)
Hemoglobin: 8.3 g/dL — ABNORMAL LOW (ref 13.0–17.0)
MCH: 33.6 pg (ref 26.0–34.0)
MCHC: 29.7 g/dL — ABNORMAL LOW (ref 30.0–36.0)
MCV: 113 fL — ABNORMAL HIGH (ref 80.0–100.0)
Platelets: 121 K/uL — ABNORMAL LOW (ref 150–400)
RBC: 2.47 MIL/uL — ABNORMAL LOW (ref 4.22–5.81)
RDW: 26.4 % — ABNORMAL HIGH (ref 11.5–15.5)
WBC: 11.7 K/uL — ABNORMAL HIGH (ref 4.0–10.5)
nRBC: 0.3 % — ABNORMAL HIGH (ref 0.0–0.2)

## 2024-02-08 LAB — COMPREHENSIVE METABOLIC PANEL WITH GFR
ALT: 19 U/L (ref 0–44)
AST: 31 U/L (ref 15–41)
Albumin: 2.3 g/dL — ABNORMAL LOW (ref 3.5–5.0)
Alkaline Phosphatase: 102 U/L (ref 38–126)
Anion gap: 12 (ref 5–15)
BUN: 122 mg/dL — ABNORMAL HIGH (ref 8–23)
CO2: 18 mmol/L — ABNORMAL LOW (ref 22–32)
Calcium: 9.7 mg/dL (ref 8.9–10.3)
Chloride: 117 mmol/L — ABNORMAL HIGH (ref 98–111)
Creatinine, Ser: 6.21 mg/dL — ABNORMAL HIGH (ref 0.61–1.24)
GFR, Estimated: 9 mL/min — ABNORMAL LOW (ref >60)
Glucose, Bld: 139 mg/dL — ABNORMAL HIGH (ref 70–99)
Potassium: 4.6 mmol/L (ref 3.5–5.1)
Sodium: 147 mmol/L — ABNORMAL HIGH (ref 135–145)
Total Bilirubin: 1.2 mg/dL (ref 0.0–1.2)
Total Protein: 7.7 g/dL (ref 6.5–8.1)

## 2024-02-08 LAB — GLUCOSE, CAPILLARY
Glucose-Capillary: 146 mg/dL — ABNORMAL HIGH (ref 70–99)
Glucose-Capillary: 146 mg/dL — ABNORMAL HIGH (ref 70–99)
Glucose-Capillary: 117 mg/dL — ABNORMAL HIGH (ref 70–99)
Glucose-Capillary: 151 mg/dL — ABNORMAL HIGH (ref 70–99)
Glucose-Capillary: 138 mg/dL — ABNORMAL HIGH (ref 70–99)
Glucose-Capillary: 121 mg/dL — ABNORMAL HIGH (ref 70–99)

## 2024-02-08 LAB — ECHOCARDIOGRAM COMPLETE
AR max vel: 1.75 cm2
AV Peak grad: 5.8 mmHg
Ao pk vel: 1.2 m/s
Area-P 1/2: 3.42 cm2
Est EF: 35
Height: 74 in
S' Lateral: 2.9 cm
Weight: 3308.66 [oz_av]

## 2024-02-08 LAB — RENAL FUNCTION PANEL
Albumin: 2.2 g/dL — ABNORMAL LOW (ref 3.5–5.0)
Anion gap: 12 (ref 5–15)
BUN: 128 mg/dL — ABNORMAL HIGH (ref 8–23)
CO2: 18 mmol/L — ABNORMAL LOW (ref 22–32)
Calcium: 8.2 mg/dL — ABNORMAL LOW (ref 8.9–10.3)
Chloride: 116 mmol/L — ABNORMAL HIGH (ref 98–111)
Creatinine, Ser: 6.24 mg/dL — ABNORMAL HIGH (ref 0.61–1.24)
GFR, Estimated: 9 mL/min — ABNORMAL LOW (ref >60)
Glucose, Bld: 129 mg/dL — ABNORMAL HIGH (ref 70–99)
Phosphorus: 5.7 mg/dL — ABNORMAL HIGH (ref 2.5–4.6)
Potassium: 4.2 mmol/L (ref 3.5–5.1)
Sodium: 146 mmol/L — ABNORMAL HIGH (ref 135–145)

## 2024-02-08 LAB — CULTURE, BLOOD (ROUTINE X 2)
Culture: NO GROWTH
Culture: NO GROWTH
Special Requests: ADEQUATE
Special Requests: ADEQUATE

## 2024-02-08 LAB — LACTIC ACID, PLASMA: Lactic Acid, Venous: 1.1 mmol/L (ref 0.5–1.9)

## 2024-02-08 LAB — MRSA NEXT GEN BY PCR, NASAL: MRSA by PCR Next Gen: NOT DETECTED

## 2024-02-08 LAB — MAGNESIUM: Magnesium: 3.1 mg/dL — ABNORMAL HIGH (ref 1.7–2.4)

## 2024-02-08 MED ORDER — SODIUM BICARBONATE 650 MG PO TABS
1300.0000 mg | ORAL_TABLET | Freq: Two times a day (BID) | ORAL | Status: DC
Start: 2024-02-08 — End: 2024-02-09
  Administered 2024-02-08 – 2024-02-09 (×2): 1300 mg
  Filled 2024-02-08 (×2): qty 2

## 2024-02-08 MED ORDER — SERTRALINE HCL 50 MG PO TABS
25.0000 mg | ORAL_TABLET | Freq: Every day | ORAL | Status: DC
Start: 2024-02-09 — End: 2024-02-18
  Administered 2024-02-09 – 2024-02-17 (×9): 25 mg
  Filled 2024-02-08 (×9): qty 1

## 2024-02-08 MED ORDER — PRISMASOL BGK 4/2.5 32-4-2.5 MEQ/L EC SOLN: Status: DC

## 2024-02-08 MED ORDER — NOREPINEPHRINE 4 MG/250ML-% IV SOLN
0.0000 ug/min | INTRAVENOUS | Status: DC
  Administered 2024-02-08: 2 ug/min via INTRAVENOUS

## 2024-02-08 MED ORDER — POLYETHYLENE GLYCOL 3350 17 G PO PACK: 17.0000 g | PACK | Freq: Every day | ORAL | Status: DC | PRN

## 2024-02-08 MED ORDER — ASPIRIN 81 MG PO CHEW
81.0000 mg | CHEWABLE_TABLET | Freq: Every day | ORAL | Status: DC
  Administered 2024-02-08 – 2024-02-17 (×10): 81 mg
  Filled 2024-02-08 (×9): qty 1

## 2024-02-08 MED ORDER — ATORVASTATIN CALCIUM 40 MG PO TABS
80.0000 mg | ORAL_TABLET | Freq: Every day | ORAL | Status: DC
  Administered 2024-02-08 – 2024-02-14 (×7): 80 mg
  Filled 2024-02-08 (×7): qty 2

## 2024-02-08 MED ORDER — FENTANYL CITRATE (PF) 100 MCG/2ML IJ SOLN
INTRAMUSCULAR | Status: AC | PRN
  Administered 2024-02-08: 25 ug via INTRAVENOUS

## 2024-02-08 MED ORDER — SODIUM CHLORIDE 0.9 % FOR CRRT: INTRAVENOUS_CENTRAL | Status: DC | PRN

## 2024-02-08 MED ORDER — FENTANYL 2500MCG IN NS 250ML (10MCG/ML) PREMIX INFUSION
0.0000 ug/h | INTRAVENOUS | Status: DC
  Administered 2024-02-08: 50 ug/h via INTRAVENOUS
  Administered 2024-02-09: 125 ug/h via INTRAVENOUS
  Filled 2024-02-08 (×2): qty 250

## 2024-02-08 MED ORDER — FLUMAZENIL 0.5 MG/5ML IV SOLN
INTRAVENOUS | Status: AC | PRN
  Administered 2024-02-08: .5 mg via INTRAVENOUS

## 2024-02-08 MED ORDER — FAMOTIDINE 20 MG PO TABS: 20.0000 mg | ORAL_TABLET | Freq: Two times a day (BID) | ORAL | Status: DC

## 2024-02-08 MED ORDER — PANTOPRAZOLE SODIUM 40 MG IV SOLR
40.0000 mg | Freq: Every day | INTRAVENOUS | Status: DC
  Administered 2024-02-08 – 2024-02-14 (×7): 40 mg via INTRAVENOUS
  Filled 2024-02-08 (×7): qty 10

## 2024-02-08 MED ORDER — CALCIUM CHLORIDE 10 % IV SOLN
INTRAVENOUS | Status: AC | PRN
  Administered 2024-02-08: 1 g via INTRAVENOUS

## 2024-02-08 MED ORDER — NALOXONE HCL 0.4 MG/ML IJ SOLN
INTRAMUSCULAR | Status: AC | PRN
  Administered 2024-02-08: .4 mg via INTRAVENOUS

## 2024-02-08 MED ORDER — SODIUM CHLORIDE 0.9 % IV SOLN
3.0000 g | Freq: Two times a day (BID) | INTRAVENOUS | Status: DC
  Administered 2024-02-08 – 2024-02-15 (×14): 3 g via INTRAVENOUS
  Filled 2024-02-08 (×14): qty 8

## 2024-02-08 MED ORDER — HEPARIN SODIUM (PORCINE) 1000 UNIT/ML IJ SOLN
INTRAMUSCULAR | Status: AC
  Filled 2024-02-08: qty 10

## 2024-02-08 MED ORDER — SODIUM BICARBONATE 8.4 % IV SOLN
INTRAVENOUS | Status: AC | PRN
  Administered 2024-02-08: 50 meq via INTRAVENOUS

## 2024-02-08 MED ORDER — PRISMASOL BGK 4/2.5 32-4-2.5 MEQ/L EC SOLN
Status: DC
Start: 2024-02-08 — End: 2024-02-12

## 2024-02-08 MED ORDER — HEPARIN SODIUM (PORCINE) 1000 UNIT/ML DIALYSIS
1000.0000 [IU] | INTRAMUSCULAR | Status: DC | PRN
  Administered 2024-02-11: 3000 [IU] via INTRAVENOUS_CENTRAL
  Filled 2024-02-08: qty 6
  Filled 2024-02-08: qty 3
  Filled 2024-02-08: qty 6

## 2024-02-08 MED ORDER — LIDOCAINE HCL (PF) 1 % IJ SOLN
20.0000 mL | Freq: Once | INTRAMUSCULAR | Status: DC
  Filled 2024-02-08: qty 20

## 2024-02-08 MED ORDER — FENTANYL CITRATE PF 50 MCG/ML IJ SOSY
50.0000 ug | PREFILLED_SYRINGE | Freq: Once | INTRAMUSCULAR | Status: AC
  Administered 2024-02-08: 50 ug via INTRAVENOUS

## 2024-02-08 MED ORDER — MIDAZOLAM HCL 2 MG/2ML IJ SOLN
INTRAMUSCULAR | Status: AC | PRN
  Administered 2024-02-08: 1 mg via INTRAVENOUS
  Administered 2024-02-08: 0.5 mg via INTRAVENOUS

## 2024-02-08 MED ORDER — DOCUSATE SODIUM 50 MG/5ML PO LIQD: 100.0000 mg | Freq: Two times a day (BID) | ORAL | Status: DC | PRN

## 2024-02-08 MED ORDER — ACETAMINOPHEN 650 MG RE SUPP: 650.0000 mg | Freq: Four times a day (QID) | RECTAL | Status: DC | PRN

## 2024-02-08 MED ORDER — CEFAZOLIN SODIUM-DEXTROSE 2-4 GM/100ML-% IV SOLN
INTRAVENOUS | Status: AC
  Filled 2024-02-08: qty 100

## 2024-02-08 MED ORDER — CEFAZOLIN SODIUM-DEXTROSE 2-4 GM/100ML-% IV SOLN
INTRAVENOUS | Status: AC | PRN
Start: 2024-02-08 — End: 2024-02-08
  Administered 2024-02-08: 2 g via INTRAVENOUS

## 2024-02-08 MED ORDER — MIDAZOLAM HCL 2 MG/2ML IJ SOLN
INTRAMUSCULAR | Status: AC
  Filled 2024-02-08: qty 2

## 2024-02-08 MED ORDER — DOCUSATE SODIUM 50 MG/5ML PO LIQD
100.0000 mg | Freq: Two times a day (BID) | ORAL | Status: DC
  Administered 2024-02-08 – 2024-02-12 (×4): 100 mg via ORAL
  Filled 2024-02-08 (×4): qty 10

## 2024-02-08 MED ORDER — FENTANYL CITRATE PF 50 MCG/ML IJ SOSY
50.0000 ug | PREFILLED_SYRINGE | INTRAMUSCULAR | Status: DC | PRN
  Administered 2024-02-08 (×3): 100 ug via INTRAVENOUS
  Filled 2024-02-08 (×2): qty 2
  Filled 2024-02-08: qty 1
  Filled 2024-02-08: qty 2

## 2024-02-08 MED ORDER — NEPRO/CARBSTEADY PO LIQD
237.0000 mL | Freq: Two times a day (BID) | ORAL | Status: DC
  Administered 2024-02-09: 237 mL

## 2024-02-08 MED ORDER — FENTANYL CITRATE (PF) 100 MCG/2ML IJ SOLN
INTRAMUSCULAR | Status: AC
  Filled 2024-02-08: qty 2

## 2024-02-08 MED ORDER — EPINEPHRINE HCL 5 MG/250ML IV SOLN IN NS
INTRAVENOUS | Status: AC
  Filled 2024-02-08: qty 250

## 2024-02-08 MED ORDER — FLUMAZENIL 0.5 MG/5ML IV SOLN
INTRAVENOUS | Status: AC
  Filled 2024-02-08: qty 5

## 2024-02-08 MED ORDER — ACETAMINOPHEN 325 MG PO TABS
650.0000 mg | ORAL_TABLET | Freq: Four times a day (QID) | ORAL | Status: DC | PRN
  Administered 2024-02-13 – 2024-02-14 (×3): 650 mg
  Filled 2024-02-08 (×3): qty 2

## 2024-02-08 MED ORDER — LIDOCAINE HCL 1 % IJ SOLN
INTRAMUSCULAR | Status: AC
  Filled 2024-02-08: qty 20

## 2024-02-08 MED ORDER — TRAZODONE HCL 50 MG PO TABS
100.0000 mg | ORAL_TABLET | Freq: Every day | ORAL | Status: DC
Start: 2024-02-08 — End: 2024-02-08

## 2024-02-08 MED ORDER — LINEZOLID 600 MG PO TABS
600.0000 mg | ORAL_TABLET | Freq: Two times a day (BID) | ORAL | Status: DC
Start: 2024-02-08 — End: 2024-02-15
  Administered 2024-02-08 – 2024-02-15 (×14): 600 mg
  Filled 2024-02-08 (×15): qty 1

## 2024-02-08 MED ORDER — NALOXONE HCL 0.4 MG/ML IJ SOLN
INTRAMUSCULAR | Status: AC
  Filled 2024-02-08: qty 1

## 2024-02-08 MED ORDER — DICYCLOMINE HCL 10 MG PO CAPS
10.0000 mg | ORAL_CAPSULE | Freq: Three times a day (TID) | ORAL | Status: DC
  Administered 2024-02-08 – 2024-02-12 (×17): 10 mg
  Filled 2024-02-08 (×20): qty 1

## 2024-02-08 MED ORDER — EPINEPHRINE 1 MG/10ML IV SOSY
PREFILLED_SYRINGE | INTRAVENOUS | Status: AC | PRN
  Administered 2024-02-08: 1 mg via INTRAVENOUS

## 2024-02-08 NOTE — Progress Notes (Signed)
   02/08/24 1433  Spiritual Encounters  Type of Visit Initial  Care provided to: Patient  Referral source Code page  Reason for visit Code  OnCall Visit No  Spiritual Framework  Presenting Themes Impactful experiences and emotions  Patient Stress Factors Health changes;Major life changes  Family Stress Factors None identified  Interventions  Spiritual Care Interventions Made Compassionate presence;Established relationship of care and support  Intervention Outcomes  Outcomes Awareness of health;Awareness of support;Reduced anxiety   Chaplain responded to Code Blue. Medical team was actively resuscitating the Pt. Chaplain provided silent prayerful presence and support to staff during the event  Pt was successfully resuscitated and stabilized. Chaplain services remain available for Pt, family, and staff as needed.

## 2024-02-08 NOTE — Anesthesia Procedure Notes (Signed)
 Procedure Name: Intubation Date/Time: 02/08/2024 1:58 PM  Performed by: Scherrie Mast, CRNAPre-anesthesia Checklist: Patient identified, Emergency Drugs available, Suction available and Patient being monitored Patient Re-evaluated:Patient Re-evaluated prior to induction Oxygen Delivery Method: Circle System Utilized Preoxygenation: Pre-oxygenation with 100% oxygen Induction Type: IV induction Ventilation: Mask ventilation without difficulty Laryngoscope Size: Glidescope and 3 Grade View: Grade I Tube type: Oral Tube size: 7.5 mm Number of attempts: 1 Airway Equipment and Method: Stylet and Oral airway Placement Confirmation: ETT inserted through vocal cords under direct vision, positive ETCO2 and breath sounds checked- equal and bilateral Secured at: 21 cm Tube secured with: Tape Dental Injury: Teeth and Oropharynx as per pre-operative assessment  Comments: ETT via VAL, +EtCO2 and color change to EtCO2 monitor, bilateral breath sounds, bloody sputum suctioned from ETT, no changes to lips or teeth post intubation, no e/o aspiration/regurgitation

## 2024-02-08 NOTE — Progress Notes (Signed)
 Echocardiogram 2D Echocardiogram has been performed.  Thea Norlander 02/08/2024, 5:54 PM

## 2024-02-08 NOTE — Plan of Care (Signed)

## 2024-02-08 NOTE — Progress Notes (Signed)
 Heart Failure Navigator Progress Note  Assessed for Heart & Vascular TOC clinic readiness.  Patient does not meet criteria due to starting hemodialysis on this admission. No HF TOC. .   Navigator will sign off at this time.   Stephane Haddock, BSN, Scientist, clinical (histocompatibility and immunogenetics) Only

## 2024-02-08 NOTE — Progress Notes (Signed)
 Admit: 02/03/2024 LOS: 5  40M with AKI on CKD4 vs progressive CKD   Subjective:  Was in IR earlier today for temp HD cath s/p code blue with PEA x24m prior to ROSC Now in 72M, discussed status with primary RN HD cath was not placed  RFP reviewed today with K4.2, BUN up to 148, creatinine 6.2 Post code blood pressures are stable not on pressors  09/21 0701 - 09/22 0700 In: 277.9 [P.O.:75; I.V.:3; IV Piggyback:199.9] Out: 275 [Urine:275]  Filed Weights   02/06/24 0500 02/07/24 0616 02/08/24 0530  Weight: 93.3 kg 97.1 kg 93.8 kg    Scheduled Meds:  aspirin  EC  81 mg Oral Daily   atorvastatin   80 mg Oral QHS   Chlorhexidine  Gluconate Cloth  6 each Topical Daily   Chlorhexidine  Gluconate Cloth  6 each Topical Q0600   [START ON 02/11/2024] darbepoetin (ARANESP ) injection - DIALYSIS  60 mcg Subcutaneous Q Thu-1800   dicyclomine   10 mg Oral TID AC & HS   docusate  100 mg Oral BID   famotidine   20 mg Per Tube BID   feeding supplement (NEPRO CARB STEADY)  237 mL Oral BID BM   free water   200 mL Oral Q6H   heparin   5,000 Units Subcutaneous Q8H   latanoprost   1 drop Both Eyes QHS   lidocaine  (PF)  20 mL Infiltration Once   linezolid   600 mg Oral Q12H   pantoprazole   40 mg Oral Daily   sertraline   25 mg Oral Daily   sodium bicarbonate   1,300 mg Oral BID   sodium chloride  flush  3 mL Intravenous Q12H   tamsulosin   0.4 mg Oral QPC supper   traZODone   100 mg Oral QHS   Continuous Infusions:  ampicillin -sulbactam (UNASYN ) IV 1.5 g (02/08/24 1002)   ceFAZolin      PRN Meds:.acetaminophen  **OR** acetaminophen , bisacodyl , ceFAZolin , fentaNYL  (SUBLIMAZE ) injection, fentaNYL , flumazenil , midazolam , naloxone , nitroGLYCERIN , ondansetron  **OR** ondansetron  (ZOFRAN ) IV, polyethylene glycol, sodium chloride  flush, traMADol   Current Labs: reviewed   Physical Exam:  Blood pressure (!) 149/73, pulse 93, temperature (!) 97.5 F (36.4 C), temperature source Oral, resp. rate 15, height 6' 2 (1.88 m),  weight 93.8 kg, SpO2 100%. Intubated and sedated Regular: No rub, normal S1-S2 Coarse breath sounds bilaterally Soft, nontender Lower extremity wounds bandaged  A AKI on CKD 4 versus progression of CKD starting HD PEA arrest 02/08/2024 in IR during temporary HD catheter insertion Urinary retention status post Foley and now on tamsulosin  Hypertension: Blood pressure is normotensive post cardiac arrest Metabolic acidosis stable values on sodium bicarbonate  Anemia, hemoglobin stable 8.1 as of yesterday Foot wounds and osteomyelitis of the fifth proximal phalanx of fifth metatarsal head on the right side on linezolid  and Unasyn  with orthopedics following Chronic DM2  P CCM to assist with HD catheter placement and then will proceed with dialysis here today. I think okay to try intermittent hemodialysis to start with, CRRT only if hemodynamics or patient's status does not permit. Medication Issues; Preferred narcotic agents for pain control are hydromorphone , fentanyl , and methadone. Morphine  should not be used.  Baclofen should be avoided Avoid oral sodium phosphate  and magnesium  citrate based laxatives / bowel preps    Bernardino Gasman MD 02/08/2024, 2:38 PM  Recent Labs  Lab 02/06/24 0832 02/07/24 0943 02/08/24 0338  NA 146* 145 146*  K 4.5 4.3 4.2  CL 114* 114* 116*  CO2 17* 15* 18*  GLUCOSE 120* 126* 129*  BUN 121* 122* 128*  CREATININE 5.10*  5.69* 6.24*  CALCIUM  8.3* 8.1* 8.2*  PHOS 4.7* 5.3* 5.7*   Recent Labs  Lab 02/03/24 1113 02/03/24 2013 02/05/24 0929 02/06/24 0832 02/07/24 0943  WBC 7.7   < > 10.9* 9.4 9.4  NEUTROABS 6.8  --   --   --   --   HGB 9.6*   < > 9.4* 8.5* 8.1*  HCT 31.9*   < > 30.1* 27.5* 27.3*  MCV 111.1*   < > 108.7* 109.1* 112.3*  PLT 155   < > 128* 122* 113*   < > = values in this interval not displayed.

## 2024-02-08 NOTE — TOC Initial Note (Signed)
 Transition of Care [facility]) - Initial/Assessment Note    Patient Details  Name: David [name] MRN: [id number] Date of Birth: [birth year]  [facility] CM/SW Contact:    Lauraine FORBES Saa, LCSWA Phone Number: 02/08/2024, 2:47 PM  Clinical Narrative:                  2:47 PM Per chart review, patient is from [facility]. SNF confirmed patient was LTC at [facility] and is able to return. Patient also has SNF LTC history with [facility] and [facility]. Patient has HH history with [facility], [facility] and [facility]. Patient has DME (oxygen, hoyer lift, hospital bed, BSC, [place], wheelchair, rolling walker, cane, shower stool) history with [facility]. Patient does not have a PCP or preferred pharmacy but has insurance. CSW will continue to follow and be available to assist.  Expected Discharge Plan: Long Term Nursing Home Barriers to Discharge: Continued Medical Work up   Patient Goals and CMS Choice            Expected Discharge Plan and Services In-house Referral: Clinical Social Work   Post Acute Care Choice: Skilled Nursing Facility Living arrangements for the past 2 months: Skilled Nursing Facility                                      Prior Living Arrangements/Services Living arrangements for the past 2 months: Skilled Nursing Facility Lives with:: Facility Resident Patient language and need for interpreter reviewed:: Yes        Need for Family Participation in Patient Care: Yes (Comment) Care giver support system in place?: Yes (comment) Current home services: DME Criminal Activity/Legal Involvement Pertinent to Current Situation/Hospitalization: No - Comment as needed  Activities of Daily Living      Permission Sought/Granted Permission sought to share information with : Family Supports, Oceanographer granted to share information with : No (Contact information on chart)  Share Information with NAME: Khalen Styer  Permission  granted to share info w AGENCY: [facility]  Permission granted to share info w Relationship: Daughter  Permission granted to share info w Contact Information: [phone]  Emotional Assessment   Attitude/Demeanor/Rapport: Unable to Assess Affect (typically observed): Unable to Assess Orientation: : Oriented to Self Alcohol / Substance Use: Not Applicable Psych Involvement: No (comment)  Admission diagnosis:  Acute CHF (HCC) [I50.9] Patient Active Problem List   Diagnosis Date Noted   Depression 02/07/2024   Gangrene of right foot (HCC) 02/05/2024   Pressure injury of skin 02/04/2024   CKD (chronic kidney disease) stage 4, GFR 15-29 ml/min (HCC) 02/04/2024   Foot osteomyelitis, right (HCC) 02/04/2024   Type 2 diabetes mellitus with hyperlipidemia (HCC) 02/04/2024   Acute CHF (HCC) 02/03/2024   Failure to thrive in adult 01/02/2024   Hypoglycemia 01/02/2024   History of CAD (coronary artery disease) 01/02/2024   History of gastrointestinal ulcer 01/02/2024   Generalized anxiety disorder 01/02/2024   Troponin level elevated 11/18/2023   Chronic anemia 09/04/2023   Chronic combined systolic and diastolic CHF (congestive heart failure) (HCC) 09/04/2023   Acute on chronic combined systolic and diastolic CHF (congestive heart failure) (HCC) 05/12/2023   Malnutrition of moderate degree 05/06/2023   Diabetic foot infection (HCC) 05/05/2023   Sacral wound 05/05/2023   Pleural effusion on right 05/05/2023   Elevated TSH 05/05/2023   Acute on chronic diastolic (congestive) heart failure (HCC) 05/05/2023  Diastolic CHF (HCC) 05/04/2023   Class 1 obesity 06/30/2022   Acute on chronic diastolic CHF (congestive heart failure) (HCC) 06/29/2022   PAD (peripheral artery disease) (HCC) 06/13/2022   Critical limb ischemia of left lower extremity (HCC) 03/21/2021   Nonhealing ulcer of left lower extremity limited to breakdown of skin (HCC) 03/21/2021   Abscess of left foot 08/03/2020    Osteomyelitis of fifth toe of left foot (HCC)    Mixed hyperlipidemia 10/15/2018   Bilateral leg edema 09/17/2018   Stage 3b chronic kidney disease (HCC) 09/10/2018   Anemia 09/10/2018   Pain and swelling of wrist, right 08/14/2018   Acute kidney injury superimposed on chronic kidney disease (HCC) 08/06/2018   Bilateral carotid artery stenosis 07/30/2018   Coronary artery disease involving native coronary artery of native heart without angina pectoris 07/30/2018   Snoring 02/11/2018   Chronic heart failure with preserved ejection fraction (HFpEF) (HCC) 12/22/2017   At risk for adverse drug reaction 12/15/2017   Chronic pain syndrome 12/15/2017   Status post coronary artery stent placement    Pneumonia due to infectious organism 03/30/2017   Insulin  dependent type 2 diabetes mellitus (HCC) 03/29/2017   Acute respiratory failure with hypoxemia (HCC) 03/29/2017   Acute on chronic respiratory failure with hypoxia (HCC)    Pulmonary congestion    Acquired contracture of Achilles tendon, right 08/19/2016   Amputated great toe, right (HCC) 07/18/2016   Onychomycosis 05/29/2016   Diabetic polyneuropathy associated with type 2 diabetes mellitus (HCC) 04/09/2016   Right foot ulcer, limited to breakdown of skin (HCC) 04/09/2016   Spinal stenosis of lumbar region without neurogenic claudication 05/24/2014   Peripheral neuropathy 09/15/2013   Malaise 08/14/2013   Essential hypertension 08/14/2013   Chronic back pain 08/14/2013   Glaucoma 08/14/2013   Headache 08/14/2013   PCP:  Pcp, No Pharmacy:  No Pharmacies Listed    Social Drivers of Health (SDOH) Social History: SDOH Screenings   Food Insecurity: Patient Unable To Answer (02/04/2024)  Housing: Patient Unable To Answer (02/04/2024)  Transportation Needs: No Transportation Needs (01/02/2024)  Utilities: Not At Risk (01/02/2024)  Alcohol Screen: Low Risk  (05/18/2023)  Financial Resource Strain: Low Risk  (05/18/2023)  Physical  Activity: Inactive (08/20/2017)  Social Connections: Socially Integrated (01/02/2024)  Stress: No Stress Concern Present (08/20/2017)  Tobacco Use: Medium Risk (02/08/2024)   SDOH Interventions:     Readmission Risk Interventions    11/09/2023   11:30 AM 11/06/2023    8:44 AM  Readmission Risk Prevention Plan  Transportation Screening Complete Complete  Medication Review Oceanographer) Complete Complete  PCP or Specialist appointment within 3-5 days of discharge Complete   HRI or Home Care Consult Complete Complete  SW Recovery Care/Counseling Consult Complete Complete  Palliative Care Screening Not Applicable Not Applicable  Skilled Nursing Facility Complete Not Applicable

## 2024-02-08 NOTE — Procedures (Signed)
 Interventional Radiology Procedure Note  Procedure: Aborted dialysis catheter placement  Complications: None  Estimated Blood Loss: None  Findings: After US  guided access of right internal jugular vein and advancement of a small caliber guidewire via 21 G needle, patient noted to be unresponsive with no pulse after receiving 1 mg Versed  and 25 mcg Fentanyl  IV. CODE BLUE called and patient given Narcan  and Flumazenil . CPR started and procedure aborted with pressure held after needle removal.  Vanellope Passmore T. Luverne, M.D Pager:  [phone]

## 2024-02-08 NOTE — Progress Notes (Signed)
 Morning meds on hold, patient is NPO for HD cath placement, takes medication crushed with apple sauce. MD aware, okay with medication hold. Plan of care continues.

## 2024-02-08 NOTE — Progress Notes (Signed)
 Report received from IR that patient coded and has been transferred to [id number]-[id number] .Report  called to [facility]. Patients belonging transferred to new location.

## 2024-02-08 NOTE — Code Documentation (Signed)
  Patient Name: David [name]   MRN: [id number]   Date of Birth/ Sex: 1945-06-15 , male      Admission Date: 02/03/2024  Attending Provider: Noralee Elidia Sieving,*  Primary Diagnosis: Acute on chronic diastolic CHF (congestive heart failure) (HCC)   Indication: Pt was in his usual state of health until this PM, when he was noted to be bradycardic then PEA. Code blue was subsequently called. At the time of arrival on scene, ACLS protocol was underway.   Technical Description:  - CPR performance duration:  3 minute  - Was defibrillation or cardioversion used? No   - Was external pacer placed? No  - Was patient intubated pre/post CPR? Yes   Medications Administered: Y = Yes; Blank = No Amiodarone    Atropine     Calcium   Y  Epinephrine   Y  Lidocaine     Magnesium     Norepinephrine     Phenylephrine     Sodium bicarbonate   Y  Vasopressin     Post CPR evaluation:  - Final Status - Was patient successfully resuscitated ? Yes - What is current rhythm? tachycardia - What is current hemodynamic status? Transfer to ICU  Miscellaneous Information:  - Labs sent, including: CBC, CMP, Lactic acid  - Primary team notified?  Yes  - Family Notified? Yes  - Additional notes/ transfer status: ICU     Rmoni Keplinger, MD  02/08/2024, 2:03 PM

## 2024-02-08 NOTE — Code Documentation (Signed)
  This RN at pt bedside and noted pt to be following commands but agitated and pulling at ETT. RN noted brady down to asystole. Code blue called, CPR started, providers at bedside. See code note for details.

## 2024-02-08 NOTE — Consult Note (Signed)
 NAME:  David [name], MRN:  [id number], DOB:  [birth year], LOS: 5 ADMISSION DATE:  02/03/2024, CONSULTATION DATE:  02/08/2024 REFERRING MD:  Dr. Noralee, CHIEF COMPLAINT:  PEA arrest   History of Present Illness:   Mr. Thorpe is a 78 year old gentleman with PMH of chronic combined systolic and diastolic heart failure (EF 30-35%), essential HTN, chronic hypoxic respiratory failure (on baseline 3L O2), CKD stage IV, CAD s/p , T2DM, chronic diabetic foot ulcers in bilateral lower extremities who presented to the hospital from [facility]F on 9/17 for worsening dyspnea for the previous 2 weeks. In the ED a CXR demonstrated pulmonary edema, BNP was elevated to 704, Cr was elevated to 5.01. He was admitted to [facility] for heart failure exacerbation.   Nephrology was consulted for AKI on CKD versus progression of CKD and ultimately decision was made to pursue HD for volume management. He went to IR on 9/22 for dialysis catheter placement. During advancement of the guidewire in the right internal jugular he became bradycardic and subsequently became unresponsive found to be in PEA arrest. CPR was started and procedure was aborted. He was intubated during the arrest. After 3 minutes of CPR, epi, calcium , and sodium bicarbonate  pushes ROSC was achieved. He was transferred to [facility] ICU for further management.   Pertinent  Medical History  Per above  Significant Hospital Events: Including procedures, antibiotic start and stop dates in addition to other pertinent events   9/17 Admitted to [facility] for acute on chronic CHF exacerbation and AKI on CKD stage IV 9/22 IR for dialysis catheter placement, complicated by bradycardia and subsequent PEA arrest while advancing guidewire. ROSC obtained after 3 minutes CPR. He was intubated during the arrest.  Interim History / Subjective:  On arrival to the unit patient was hemodynamically stable off pressors. In slow Afib 50-60's. Off sedation was able to wake up and move all extremities  spontaneously. Then developed asystolic arrest requiring 2 rounds of CPR, 2 mg epinephrine , and 1 amp bicarb with ROSC.   Objective    Blood pressure (!) 149/73, pulse 93, temperature (!) 97.5 F (36.4 C), temperature source Oral, resp. rate 15, height 6' 2 (1.88 m), weight 93.8 kg, SpO2 100%.    FiO2 (%):  [100 %] 100 %   Intake/Output Summary (Last 24 hours) at 02/08/2024 1418 Last data filed at 02/08/2024 1100 Gross per 24 hour  Intake 128 ml  Output 275 ml  Net -147 ml   Filed Weights   02/06/24 0500 02/07/24 0616 02/08/24 0530  Weight: 93.3 kg 97.1 kg 93.8 kg    Examination: General: chronically ill appearing, elderly gentleman  HENT: pupils 3 mm and reactive bilaterally, sclera anicteric, AT/ Lungs: on mechanical ventilation, rhonchi bilateral bases Cardiovascular: regular rate and rhythm, normal S1 and S2, no m/r/g Abdomen: soft, non-distended, normoactive bowel sounds Extremities: bilaterally ankles and feet with dressing in place c/d/I, skin tears on left leg, chronic skin changes, cool to touch Neuro: intubated, moves bilateral upper extremities spontaneously, flaccid in bilateral lower extremities  GU: deferred  Resolved problem list   Assessment and Plan   Cardiac arrest Initially PEA in the setting of guidewire placement while obtaining central venous access for HD catheter in IR. Likely secondary to irritation from guidewire in the setting of underlying combined heart failure and pulmonary hypertension. Subsequently had asystolic arrest shortly after arrival to the unit with ROSC after 2 rounds of CPR and epi.  - Limit sedating medication and work towards obtaining neuro  exam - Low threshold for CTH if neuro concerns  - Maintain normothermia, avoid fever   Shock, mixed Distributive following cardiac arrest, possibly cardiogenic in the setting of acute on chronic combined heart failure and arrhythmia also likely contributing (see below). - Continue Levophed  as  needed to maintain MAP>65 - Repeat Echo  Acute on chronic combined systolic and diastolic heart failure Pulmonary hypertension  Most recent Echo 10/30/2023 with EF 35-40%, global hypokinesis, grade II diastolic dysfunction, mild RV, severely elevated pulmonary artery systolic pressure, and mild MR/TR.  - Echo per above  Atrial fibrillation Sinus pause Currently in slow atrial fibrillation in the 50-60's with PVCs on monitor. Intermittently having sinus pauses.  - Continue tele monitoring - ECG now  - Check BMP and correction of electrolytes as needed - Consider TVP if ongoing sinus pauses  Acute on chronic hypoxic respiratory failure, in the setting of PEA arrest  Chronic hypoxic respiratory failure on 3L nasal cannula at home. Intubated during PEA arrest in IR.  - LTVV with 6-8 cc/kg IBW tidal volume, goal Pplat<30, DP<15 - Wean FiO2 as able to maintain SpO2 88-94% - PAD protocol with PRN fentanyl  only for now - CXR and ABG now and PRN - VAP prophylaxis per protocol - GI prophylaxis with pantoprazole   AKI on CKD versus progression of CKD Uremia Baseline Cr prior to admission ~2.5-3. Cr currently 6.21, BUN 122. - Nephrology consulted appreciate recommendations - Will plan to start CRRT - Renally dose medications, ensure adequate renal perfusion  Right foot osteomyelitis  Progressive gangrenous changes of both feet, wet gangrene of lateral aspect of right foot, dry gangrene of left heel   Leukocytosis - Ortho following and had recommended amputation prior to cardiac arrest. At that time patient endorsed that he would be opposed to amputation, had planned to discuss again following dialysis - Will send blood cultures - Continue Linezolid  and Unasyn   - Hold off on broadening antimicrobials for now, low threshold to start if clinically worsens or febrile  Anemia of chronic disease Hgb has been stable ~8. No active signs of bleeding. - Continue to monitor daily CBC  T2DM -  Continue CBG q4h and SSI  VTE prophylaxis: SQH  Labs   CBC: Recent Labs  Lab 02/03/24 1113 02/03/24 2013 02/04/24 0318 02/05/24 0929 02/06/24 0832 02/07/24 0943  WBC 7.7 7.2 7.5 10.9* 9.4 9.4  NEUTROABS 6.8  --   --   --   --   --   HGB 9.6* 9.7* 8.9* 9.4* 8.5* 8.1*  HCT 31.9* 32.6* 29.2* 30.1* 27.5* 27.3*  MCV 111.1* 112.8* 109.8* 108.7* 109.1* 112.3*  PLT 155 149* 141* 128* 122* 113*    Basic Metabolic Panel: Recent Labs  Lab 02/04/24 0318 02/05/24 0929 02/06/24 0832 02/07/24 0943 02/08/24 0338  NA 140 146* 146* 145 146*  K 4.9 4.7 4.5 4.3 4.2  CL 106 115* 114* 114* 116*  CO2 20* 16* 17* 15* 18*  GLUCOSE 98 117* 120* 126* 129*  BUN 126* 120* 121* 122* 128*  CREATININE 5.31* 5.34* 5.10* 5.69* 6.24*  CALCIUM  8.8* 8.6* 8.3* 8.1* 8.2*  PHOS 5.5*  --  4.7* 5.3* 5.7*   GFR: Estimated Creatinine Clearance: 11.7 mL/min (A) (by C-G formula based on SCr of 6.24 mg/dL (H)). Recent Labs  Lab 02/03/24 1720 02/03/24 2013 02/04/24 0318 02/05/24 0929 02/06/24 0832 02/07/24 0943  WBC  --    < > 7.5 10.9* 9.4 9.4  LATICACIDVEN 1.3  --   --   --   --   --    < > =  values in this interval not displayed.    Liver Function Tests: Recent Labs  Lab 02/04/24 0318 02/06/24 0832 02/07/24 0943 02/08/24 0338  AST 32  --   --   --   ALT 18  --   --   --   ALKPHOS 140*  --   --   --   BILITOT 1.3*  --   --   --   PROT 7.7  --   --   --   ALBUMIN  2.7* 2.5* 2.4* 2.2*   No results for input(s): LIPASE, AMYLASE in the last 168 hours. No results for input(s): AMMONIA in the last 168 hours.  ABG    Component Value Date/Time   PHART 7.298 (L) 05/12/2023 0354   PCO2ART 44.7 05/12/2023 0354   PO2ART 66 (L) 05/12/2023 0354   HCO3 22.0 05/12/2023 0354   TCO2 25 09/04/2023 0855   ACIDBASEDEF 4.0 (H) 05/12/2023 0354   O2SAT 91 05/12/2023 0354     Coagulation Profile: No results for input(s): INR, PROTIME in the last 168 hours.  Cardiac Enzymes: No results for  input(s): CKTOTAL, CKMB, CKMBINDEX, TROPONINI in the last 168 hours.  HbA1C: Hgb A1c MFr Bld  Date/Time Value Ref Range Status  10/30/2023 02:08 AM 6.0 (H) 4.8 - 5.6 % Final    Comment:    (NOTE) Diagnosis of Diabetes The following HbA1c ranges recommended by the American Diabetes Association (ADA) may be used as an aid in the diagnosis of diabetes mellitus.  Hemoglobin             Suggested A1C NGSP%              Diagnosis  <5.7                   Non Diabetic  5.7-6.4                Pre-Diabetic  >6.4                   Diabetic  <7.0                   Glycemic control for                       adults with diabetes.    05/04/2023 11:05 PM 6.4 (H) 4.8 - 5.6 % Final    Comment:    (NOTE) Pre diabetes:          5.7%-6.4%  Diabetes:              >6.4%  Glycemic control for   <7.0% adults with diabetes     CBG: Recent Labs  Lab 02/07/24 1543 02/07/24 2048 02/08/24 0557 02/08/24 0818 02/08/24 1158  GLUCAP 120* 149* 146* 146* 117*    Review of Systems:   Unable to obtain patient intubated and sedated   Past Medical History:  He,  has a past medical history of Anemia, Arthritis, CAD in native artery, CHF (congestive heart failure) (HCC), CKD (chronic kidney disease) stage 3, GFR 30-59 ml/min (HCC), DDD (degenerative disc disease), cervical, Diabetes mellitus with complication (HCC), Diabetic neuropathy (HCC), Diabetic retinopathy (HCC), GERD (gastroesophageal reflux disease), Glaucoma, and Hypertension.   Surgical History:   Past Surgical History:  Procedure Laterality Date   AMPUTATION Right 07/09/2016   Procedure: Right Great Toe Amputation at Metatarsophalangeal Joint;  Surgeon: Jerona Harden GAILS, MD;  Location: [facility] OR;  Service: Orthopedics;  Laterality: Right;  AMPUTATION Right 05/28/2018   Procedure: RIGHT SECOND TOE AMPUTATION;  Surgeon: Harden Jerona GAILS, MD;  Location: [facility] OR;  Service: Orthopedics;  Laterality: Right;   AMPUTATION Left 08/03/2020    Procedure: LEFT 5TH RAY AMPUTATION;  Surgeon: Harden Jerona GAILS, MD;  Location: [facility] OR;  Service: Orthopedics;  Laterality: Left;   BACK SURGERY     4   CARDIAC CATHETERIZATION     CORONARY STENT INTERVENTION N/A 04/06/2017   Procedure: CORONARY STENT INTERVENTION;  Surgeon: Elmira Newman PARAS, MD;  Location: [facility] INVASIVE CV LAB;  Service: Cardiovascular;  Laterality: N/A;   CORONARY STENT INTERVENTION N/A 04/07/2017   Procedure: CORONARY STENT INTERVENTION;  Surgeon: Elmira Newman PARAS, MD;  Location: [facility] INVASIVE CV LAB;  Service: Cardiovascular;  Laterality: N/A;   CYST EXCISION     on Back   ENDOTRACHEAL INTUBATION EMERGENT  08/07/2018       ESOPHAGOGASTRODUODENOSCOPY Left 11/07/2023   Procedure: EGD (ESOPHAGOGASTRODUODENOSCOPY);  Surgeon: Rosalie Kitchens, MD;  Location: [facility];  Service: Gastroenterology;  Laterality: Left;   JOINT REPLACEMENT     LAPAROSCOPIC ABDOMINAL EXPLORATION N/A 11/26/2017   Procedure: LAPAROSCOPIC ABDOMINAL EXPLORATION, DRAINAGE OF APPENDICEAL ABCESS. PLACEMENT OF DRAIN;  Surgeon: Ethyl Lenis, MD;  Location: [facility] OR;  Service: General;  Laterality: N/A;   RIGHT/LEFT HEART CATH AND CORONARY ANGIOGRAPHY N/A 04/06/2017   Procedure: RIGHT/LEFT HEART CATH AND CORONARY ANGIOGRAPHY;  Surgeon: Elmira Newman PARAS, MD;  Location: [facility]VASIVE CV LAB;  Service: Cardiovascular;  Laterality: N/A;   TOE SURGERY  05/2018   Left    TOTAL HIP ARTHROPLASTY     Right      Social History:   reports that he quit smoking about 29 years ago. His smoking use included cigarettes. He started smoking about 34 years ago. He has a 1.3 pack-year smoking history. He has been exposed to tobacco smoke. He has never used smokeless tobacco. He reports that he does not currently use alcohol. He reports that he does not use drugs.   Family History:  His family history includes Alzheimer's disease in his father and another family member; Diabetes in an other family member; Diabetes Mellitus II in  his father; Hyperlipidemia in an other family member; Hypertension in an other family member; Stroke in an other family member; Thyroid  disease in his mother.   Allergies Allergies  Allergen Reactions   Nsaids Other (See Comments)    CKD stage 3      Home Medications  Prior to Admission medications   Medication Sig Start Date End Date Taking? Authorizing Provider  acetaminophen  (TYLENOL ) 500 MG tablet Take 2 tablets (1,000 mg total) by mouth in the morning and at bedtime. 11/21/23  Yes Barbarann Nest, MD  albuterol  (VENTOLIN  HFA) 108 (90 Base) MCG/ACT inhaler Inhale 2 puffs into the lungs every 6 (six) hours as needed for wheezing or shortness of breath. 11/21/23  Yes Barbarann Nest, MD  ascorbic acid (VITAMIN C) 500 MG tablet Take 500 mg by mouth daily.   Yes [provider]  aspirin  EC 81 MG tablet Take 1 tablet (81 mg total) by mouth daily. 11/21/23  Yes Barbarann Nest, MD  atorvastatin  (LIPITOR ) 80 MG tablet Take 1 tablet (80 mg total) by mouth at bedtime. 11/21/23  Yes Barbarann Nest, MD  bisacodyl  (DULCOLAX) 10 MG suppository Place 1 suppository (10 mg total) rectally daily as needed for moderate constipation. 11/21/23  Yes Barbarann Nest, MD  Cholecalciferol  (VITAMIN D3) 1.25 MG (50000 UT) CAPS Take 50,000 Units by mouth  every 30 (thirty) days.   Yes [provider]  Cyanocobalamin  (VITAMIN B-12 CR) 1000 MCG TBCR Take 1,000 mcg by mouth every 14 (fourteen) days.   Yes [provider]  dextromethorphan -guaiFENesin  (MUCINEX  DM) 30-600 MG 12hr tablet Take 1 tablet by mouth 2 (two) times daily. 11/21/23  Yes Barbarann Nest, MD  dicyclomine  (BENTYL ) 10 MG capsule Take 1 capsule (10 mg total) by mouth in the morning, at noon, in the evening, and at bedtime. 11/21/23  Yes Barbarann Nest, MD  docusate sodium  (COLACE) 100 MG capsule Take 1 capsule (100 mg total) by mouth 2 (two) times daily. 11/21/23  Yes Barbarann Nest, MD  ferrous sulfate  325 (65 FE) MG tablet Take 1 tablet  (325 mg total) by mouth daily with breakfast. 11/21/23  Yes Barbarann Nest, MD  gabapentin  (NEURONTIN ) 100 MG capsule Take 2 capsules (200 mg total) by mouth 3 (three) times daily. Patient taking differently: Take 200 mg by mouth 2 (two) times daily. 11/21/23  Yes Barbarann Nest, MD  insulin  aspart (NOVOLOG ) 100 UNIT/ML injection Inject 2-12 Units into the skin in the morning and at bedtime. Per sliding scale, 201-250= 2 units, 251-300= 4 units, 301-350= 6 units, 351-400= 8 units, 401-450= 10 units, 451-500= 12 units, if greater than 400 call MD 01/06/24  Yes Cheryle Page, MD  isosorbide -hydrALAZINE  (BIDIL ) 20-37.5 MG tablet Take 2 tablets by mouth three times daily. 11/25/23  Yes Patwardhan, Manish J, MD  latanoprost  (XALATAN ) 0.005 % ophthalmic solution Place 1 drop into both eyes at bedtime. 11/21/23  Yes Barbarann Nest, MD  lubiprostone  (AMITIZA ) 24 MCG capsule Take 1 capsule (24 mcg total) by mouth daily. 11/21/23  Yes Barbarann Nest, MD  melatonin 3 MG TABS tablet Take 1 tablet (3 mg total) by mouth at bedtime. 11/21/23  Yes Barbarann Nest, MD  methocarbamol  (ROBAXIN ) 500 MG tablet Take 1 tablet (500 mg total) by mouth every 6 (six) hours as needed for muscle spasms. 11/21/23  Yes Barbarann Nest, MD  metoCLOPramide  (REGLAN ) 5 MG tablet Take 1 tablet (5 mg total) by mouth 3 (three) times daily. 11/21/23 11/20/24 Yes Barbarann Nest, MD  Multiple Vitamins-Minerals (CERTAVITE SENIOR) TABS Take 1 tablet by mouth daily. 11/21/23  Yes Barbarann Nest, MD  nitroGLYCERIN  (NITROSTAT ) 0.4 MG SL tablet Place 1 tablet (0.4 mg total) under the tongue every 5 (five) minutes as needed for chest pain. 11/21/23  Yes Barbarann Nest, MD  nutrition supplement, JUVEN, (JUVEN) PACK Take 1 packet by mouth 2 (two) times daily between meals.   Yes [provider]  ondansetron  (ZOFRAN ) 4 MG tablet Take 1 tablet (4 mg total) by mouth every 6 (six) hours as needed for nausea or vomiting. 11/21/23  Yes Barbarann Nest, MD  OXYGEN  Inhale 3 L/min into the lungs continuous.   Yes [provider]  pantoprazole  (PROTONIX ) 40 MG tablet Take 1 tablet (40 mg total) by mouth daily. 11/21/23  Yes Barbarann Nest, MD  polyethylene glycol (MIRALAX  / GLYCOLAX ) 17 g packet Take 17 g by mouth daily as needed for mild constipation or moderate constipation.   Yes [provider]  polyethylene glycol powder (GLYCOLAX /MIRALAX ) 17 GM/SCOOP powder Take 17 g by mouth daily. PRN order: 17 g by mouth daily as needed for constipation Patient taking differently: Take 17 g by mouth daily. 11/21/23  Yes Barbarann Nest, MD  pyridoxine  (B-6) 100 MG tablet Take 1 tablet (100 mg total) by mouth daily. 11/21/23  Yes Barbarann Nest, MD  sertraline  (ZOLOFT ) 25 MG tablet Take  1 tablet (25 mg total) by mouth daily. 11/21/23  Yes Barbarann Nest, MD  sodium chloride  (OCEAN) 0.65 % SOLN nasal spray Place 1 spray into both nostrils as needed for congestion. Patient taking differently: Place 1 spray into both nostrils daily as needed for congestion. 11/21/23  Yes Barbarann Nest, MD  traMADol  (ULTRAM ) 50 MG tablet Take 1 tablet (50 mg total) by mouth every 12 (twelve) hours as needed for moderate pain (pain score 4-6) or severe pain (pain score 7-10). 01/07/24  Yes Cheryle Page, MD  traZODone  (DESYREL ) 100 MG tablet Take 1 tablet (100 mg total) by mouth at bedtime. 11/21/23  Yes Barbarann Nest, MD  ZINC OXIDE, TOPICAL, EX Apply 1 application  topically in the morning and at bedtime. Apply to buttocks topically every day and night shift   Yes [provider]     Critical care time: 60 minutes    The patient is critically ill with multiple organ system failure and requires high complexity decision making for assessment and support, frequent evaluation and titration of therapies, advanced monitoring, review of radiographic studies and interpretation of complex data.   Critical Care Time devoted to patient care services, exclusive of separately  billable procedures, described in this note is 60 minutes.  Rexene LOISE Blush, PA-C  Pulmonary & Critical Care 02/08/24 6:28 PM  Please see [email or web address] for pager details.  From 7A-7P if no response, please call [phone] After hours, please call ELink [phone]

## 2024-02-08 NOTE — Procedures (Addendum)
 Arterial Catheter Insertion Procedure Note  SQUARE JOWETT  [id number]  [birth year]  Date:02/08/24  Time:6:34 PM    Provider Performing: Rexene LOISE Blush    Procedure: Insertion of Arterial Line (63379) with US  guidance (23062)   Indication(s) Blood pressure monitoring and/or need for frequent ABGs  Consent Unable to obtain consent due to emergent nature of procedure.  Anesthesia None   Time Out Verified patient identification, verified procedure, site/side was marked, verified correct patient position, special equipment/implants available, medications/allergies/relevant history reviewed, required imaging and test results available.   Sterile Technique Maximal sterile technique including full sterile barrier drape, hand hygiene, sterile gown, sterile gloves, mask, hair covering, sterile ultrasound probe cover (if used).   Procedure Description Area of catheter insertion was cleaned with chlorhexidine  and draped in sterile fashion. With real-time ultrasound guidance an arterial catheter was placed into the left femoral artery.  Appropriate arterial tracings confirmed on monitor.     Complications/Tolerance None; patient tolerated the procedure well.   EBL Minimal   Specimen(s) None  Rexene LOISE Blush, [place] [facility] 02/08/24 6:34 PM  Please see [email or web address] for pager details.  From 7A-7P if no response, please call [phone] After hours, please call ELink [phone]

## 2024-02-08 NOTE — Progress Notes (Signed)
 Patient had PEA cardiac arrest while in IR. Patient has recovered spontaneous circulation, currently intubated and was transfer to the intensive care unit.

## 2024-02-08 NOTE — Code Documentation (Signed)
  Patient Name: David [name]   MRN: [id number]   Date of Birth/ Sex: Jan 18, 1946 , male      Admission Date: 02/03/2024  Attending Provider: Kassie Acquanetta Bradley, MD  Primary Diagnosis: Acute on chronic diastolic CHF (congestive heart failure) (HCC)   Indication: Pt was in his usual state of health until this PM, when he was noted to be in asystole. Code blue was subsequently called. At the time of arrival on scene, ACLS protocol was underway.   Technical Description:  - CPR performance duration:  6 minute  - Was defibrillation or cardioversion used? No   - Was external pacer placed? No  - Was patient intubated pre/post CPR? Yes   Medications Administered: Y = Yes; Blank = No Amiodarone    Atropine     Calcium   1  Epinephrine   2  Lidocaine     Magnesium     Norepinephrine     Phenylephrine     Sodium bicarbonate   1  Vasopressin     Post CPR evaluation:  - Final Status - Was patient successfully resuscitated ? Yes - What is current rhythm? sinus - What is current hemodynamic status?intubated, on   Miscellaneous Information:  - Labs sent, including: None; had recent lab draw since recent  - Primary team notified?  Yes, at bedside  - Family Notified? Yes, by Dr. Neda  - Additional notes/ transfer status:      Elnora Ip, MD  02/08/2024, 3:27 PM

## 2024-02-08 NOTE — Progress Notes (Signed)
   02/08/24 1640  Spiritual Encounters  Type of Visit Follow up  Care provided to: Family  Referral source Chaplain assessment  Reason for visit Code  OnCall Visit No  Spiritual Framework  Presenting Themes Impactful experiences and emotions  Community/Connection Family  Patient Stress Factors None identified  Family Stress Factors Exhausted;Health changes  Interventions  Spiritual Care Interventions Made Compassionate presence;Established relationship of care and support  Intervention Outcomes  Outcomes Awareness of health;Awareness of support;Reduced anxiety   Chaplain responded to Code blue (2x). While the medical team was resuscitating the pt, chaplain sat with the Pt's wife in the waiting room.  She was visible emotional distress. Chaplain provide active listening, compassionate presence, and support as she process the trauma of the event. Chaplain remained with her until her emotions stabilized.  At her request, chaplain offered prayer and assured continued availability for ongoing support.

## 2024-02-08 NOTE — Consult Note (Signed)
 Chief Complaint: Patient was seen in consultation today for AKI on CKD in need of HD at the request of Jerrye Mar  Referring Physician(s): Jerrye Mar  Supervising Physician: Luverne Aran  Patient Status: [facility] - In-pt  History of Present Illness: David [name] is a 78 y.o. male with PMHs of HTN, CAD, CHF, DM, and CKD IV who is in need of long term HD access.   Patient came to ED on 9/17 due to SOB. Workup showed AKI on CKD and he was admitted for further eval and management. Nephrology has been following the patient for AKI on CKD. After thorough discussion with the patient and family, decision was made to proceed with HD. IR was consulted for tunneled HD catheter placement.   Patient laying in bed sleeping, opens his eyes when called but does not answer questions.   Past Medical History:  Diagnosis Date   Anemia    Arthritis    CAD in native artery    s/p stent in 11/18   CHF (congestive heart failure) (HCC)    normal echo in 11/18   CKD (chronic kidney disease) stage 3, GFR 30-59 ml/min (HCC)    DDD (degenerative disc disease), cervical    Diabetes mellitus with complication (HCC)    Type II   Diabetic neuropathy (HCC)    Diabetic retinopathy (HCC)    GERD (gastroesophageal reflux disease)    Glaucoma    Hypertension     Past Surgical History:  Procedure Laterality Date   AMPUTATION Right 07/09/2016   Procedure: Right Great Toe Amputation at Metatarsophalangeal Joint;  Surgeon: Jerona Harden GAILS, MD;  Location: [facility] OR;  Service: Orthopedics;  Laterality: Right;   AMPUTATION Right 05/28/2018   Procedure: RIGHT SECOND TOE AMPUTATION;  Surgeon: Harden Jerona GAILS, MD;  Location: [facility] OR;  Service: Orthopedics;  Laterality: Right;   AMPUTATION Left 08/03/2020   Procedure: LEFT 5TH RAY AMPUTATION;  Surgeon: Harden Jerona GAILS, MD;  Location: [facility] OR;  Service: Orthopedics;  Laterality: Left;   BACK SURGERY     4   CARDIAC CATHETERIZATION     CORONARY STENT INTERVENTION N/A  04/06/2017   Procedure: CORONARY STENT INTERVENTION;  Surgeon: Elmira Newman PARAS, MD;  Location: [facility] INVASIVE CV LAB;  Service: Cardiovascular;  Laterality: N/A;   CORONARY STENT INTERVENTION N/A 04/07/2017   Procedure: CORONARY STENT INTERVENTION;  Surgeon: Elmira Newman PARAS, MD;  Location: [facility] INVASIVE CV LAB;  Service: Cardiovascular;  Laterality: N/A;   CYST EXCISION     on Back   ENDOTRACHEAL INTUBATION EMERGENT  08/07/2018       ESOPHAGOGASTRODUODENOSCOPY Left 11/07/2023   Procedure: EGD (ESOPHAGOGASTRODUODENOSCOPY);  Surgeon: Rosalie Kitchens, MD;  Location: [facility];  Service: Gastroenterology;  Laterality: Left;   JOINT REPLACEMENT     LAPAROSCOPIC ABDOMINAL EXPLORATION N/A 11/26/2017   Procedure: LAPAROSCOPIC ABDOMINAL EXPLORATION, DRAINAGE OF APPENDICEAL ABCESS. PLACEMENT OF DRAIN;  Surgeon: Ethyl Lenis, MD;  Location: [facility] OR;  Service: General;  Laterality: N/A;   RIGHT/LEFT HEART CATH AND CORONARY ANGIOGRAPHY N/A 04/06/2017   Procedure: RIGHT/LEFT HEART CATH AND CORONARY ANGIOGRAPHY;  Surgeon: Elmira Newman PARAS, MD;  Location: [facility] INVASIVE CV LAB;  Service: Cardiovascular;  Laterality: N/A;   TOE SURGERY  05/2018   Left    TOTAL HIP ARTHROPLASTY     Right     Allergies: Nsaids  Medications: Prior to Admission medications   Medication Sig Start Date End Date Taking? Authorizing Provider  acetaminophen  (TYLENOL ) 500 MG tablet Take 2 tablets (  1,000 mg total) by mouth in the morning and at bedtime. 11/21/23  Yes Barbarann Nest, MD  albuterol  (VENTOLIN  HFA) 108 (90 Base) MCG/ACT inhaler Inhale 2 puffs into the lungs every 6 (six) hours as needed for wheezing or shortness of breath. 11/21/23  Yes Barbarann Nest, MD  ascorbic acid (VITAMIN C) 500 MG tablet Take 500 mg by mouth daily.   Yes [provider]  aspirin  EC 81 MG tablet Take 1 tablet (81 mg total) by mouth daily. 11/21/23  Yes Barbarann Nest, MD  atorvastatin  (LIPITOR ) 80 MG tablet Take 1 tablet (80 mg total)  by mouth at bedtime. 11/21/23  Yes Barbarann Nest, MD  bisacodyl  (DULCOLAX) 10 MG suppository Place 1 suppository (10 mg total) rectally daily as needed for moderate constipation. 11/21/23  Yes Barbarann Nest, MD  Cholecalciferol  (VITAMIN D3) 1.25 MG (50000 UT) CAPS Take 50,000 Units by mouth every 30 (thirty) days.   Yes [provider]  Cyanocobalamin  (VITAMIN B-12 CR) 1000 MCG TBCR Take 1,000 mcg by mouth every 14 (fourteen) days.   Yes [provider]  dextromethorphan -guaiFENesin  (MUCINEX  DM) 30-600 MG 12hr tablet Take 1 tablet by mouth 2 (two) times daily. 11/21/23  Yes Barbarann Nest, MD  dicyclomine  (BENTYL ) 10 MG capsule Take 1 capsule (10 mg total) by mouth in the morning, at noon, in the evening, and at bedtime. 11/21/23  Yes Barbarann Nest, MD  docusate sodium  (COLACE) 100 MG capsule Take 1 capsule (100 mg total) by mouth 2 (two) times daily. 11/21/23  Yes Barbarann Nest, MD  ferrous sulfate  325 (65 FE) MG tablet Take 1 tablet (325 mg total) by mouth daily with breakfast. 11/21/23  Yes Barbarann Nest, MD  gabapentin  (NEURONTIN ) 100 MG capsule Take 2 capsules (200 mg total) by mouth 3 (three) times daily. Patient taking differently: Take 200 mg by mouth 2 (two) times daily. 11/21/23  Yes Barbarann Nest, MD  insulin  aspart (NOVOLOG ) 100 UNIT/ML injection Inject 2-12 Units into the skin in the morning and at bedtime. Per sliding scale, 201-250= 2 units, 251-300= 4 units, 301-350= 6 units, 351-400= 8 units, 401-450= 10 units, 451-500= 12 units, if greater than 400 call MD 01/06/24  Yes Cheryle Page, MD  isosorbide -hydrALAZINE  (BIDIL ) 20-37.5 MG tablet Take 2 tablets by mouth three times daily. 11/25/23  Yes Patwardhan, Manish J, MD  latanoprost  (XALATAN ) 0.005 % ophthalmic solution Place 1 drop into both eyes at bedtime. 11/21/23  Yes Barbarann Nest, MD  lubiprostone  (AMITIZA ) 24 MCG capsule Take 1 capsule (24 mcg total) by mouth daily. 11/21/23  Yes Barbarann Nest, MD  melatonin 3 MG  TABS tablet Take 1 tablet (3 mg total) by mouth at bedtime. 11/21/23  Yes Barbarann Nest, MD  methocarbamol  (ROBAXIN ) 500 MG tablet Take 1 tablet (500 mg total) by mouth every 6 (six) hours as needed for muscle spasms. 11/21/23  Yes Barbarann Nest, MD  metoCLOPramide  (REGLAN ) 5 MG tablet Take 1 tablet (5 mg total) by mouth 3 (three) times daily. 11/21/23 11/20/24 Yes Barbarann Nest, MD  Multiple Vitamins-Minerals (CERTAVITE SENIOR) TABS Take 1 tablet by mouth daily. 11/21/23  Yes Barbarann Nest, MD  nitroGLYCERIN  (NITROSTAT ) 0.4 MG SL tablet Place 1 tablet (0.4 mg total) under the tongue every 5 (five) minutes as needed for chest pain. 11/21/23  Yes Barbarann Nest, MD  nutrition supplement, JUVEN, (JUVEN) PACK Take 1 packet by mouth 2 (two) times daily between meals.   Yes [provider]  ondansetron  (ZOFRAN ) 4 MG tablet Take 1 tablet (4 mg  total) by mouth every 6 (six) hours as needed for nausea or vomiting. 11/21/23  Yes Barbarann Nest, MD  OXYGEN Inhale 3 L/min into the lungs continuous.   Yes [provider]  pantoprazole  (PROTONIX ) 40 MG tablet Take 1 tablet (40 mg total) by mouth daily. 11/21/23  Yes Barbarann Nest, MD  polyethylene glycol (MIRALAX  / GLYCOLAX ) 17 g packet Take 17 g by mouth daily as needed for mild constipation or moderate constipation.   Yes [provider]  polyethylene glycol powder (GLYCOLAX /MIRALAX ) 17 GM/SCOOP powder Take 17 g by mouth daily. PRN order: 17 g by mouth daily as needed for constipation Patient taking differently: Take 17 g by mouth daily. 11/21/23  Yes Barbarann Nest, MD  pyridoxine  (B-6) 100 MG tablet Take 1 tablet (100 mg total) by mouth daily. 11/21/23  Yes Barbarann Nest, MD  sertraline  (ZOLOFT ) 25 MG tablet Take 1 tablet (25 mg total) by mouth daily. 11/21/23  Yes Barbarann Nest, MD  sodium chloride  (OCEAN) 0.65 % SOLN nasal spray Place 1 spray into both nostrils as needed for congestion. Patient taking differently: Place 1 spray into  both nostrils daily as needed for congestion. 11/21/23  Yes Barbarann Nest, MD  traMADol  (ULTRAM ) 50 MG tablet Take 1 tablet (50 mg total) by mouth every 12 (twelve) hours as needed for moderate pain (pain score 4-6) or severe pain (pain score 7-10). 01/07/24  Yes Cheryle Page, MD  traZODone  (DESYREL ) 100 MG tablet Take 1 tablet (100 mg total) by mouth at bedtime. 11/21/23  Yes Barbarann Nest, MD  ZINC OXIDE, TOPICAL, EX Apply 1 application  topically in the morning and at bedtime. Apply to buttocks topically every day and night shift   Yes [provider]     Family History  Problem Relation Age of Onset   Diabetes Other    Hyperlipidemia Other    Hypertension Other    Stroke Other    Alzheimer's disease Other    Thyroid  disease Mother    Diabetes Mellitus II Father    Alzheimer's disease Father     Social History   Socioeconomic History   Marital status: Married    Spouse name: [name]   Number of children: 2   Years of education: 16   Highest education level: Master's degree (e.g., MA, MS, MEng, MEd, MSW, MBA)  Occupational History   Occupation: Retired  Tobacco Use   Smoking status: Former    Current packs/day: 0.00    Average packs/day: 0.3 packs/day for 5.0 years (1.3 ttl pk-yrs)    Types: Cigarettes    Start date: 65    Quit date: 1996    Years since quitting: 29.7    Passive exposure: Past   Smokeless tobacco: Never  Vaping Use   Vaping status: Never Used  Substance and Sexual Activity   Alcohol use: Not Currently    Comment: occ   Drug use: No   Sexual activity: Yes  Other Topics Concern   Not on file  Social History Narrative   Not on file   Social Drivers of Health   Financial Resource Strain: Low Risk  (05/18/2023)   Overall Financial Resource Strain (CARDIA)    Difficulty of Paying Living Expenses: Not very hard  Food Insecurity: Patient Unable To Answer (02/04/2024)   Hunger Vital Sign    Worried About Running Out of Food in the Last  Year: Patient unable to answer    Ran Out of Food in the Last Year: Patient unable to  answer  Transportation Needs: No Transportation Needs (01/02/2024)   PRAPARE - Administrator, Civil Service (Medical): No    Lack of Transportation (Non-Medical): No  Physical Activity: Inactive (08/20/2017)   Exercise Vital Sign    Days of Exercise per Week: 0 days    Minutes of Exercise per Session: 0 min  Stress: No Stress Concern Present (08/20/2017)   [employer] of Occupational Health - Occupational Stress Questionnaire    Feeling of Stress : Not at all  Social Connections: Socially Integrated (01/02/2024)   Social Connection and Isolation Panel    Frequency of Communication with Friends and Family: Twice a week    Frequency of Social Gatherings with Friends and Family: Twice a week    Attends Religious Services: More than 4 times per year    Active Member of [employer] or Organizations: No    Attends Engineer, structural: 1 to 4 times per year    Marital Status: Married     Review of Systems: A 12 point ROS discussed and pertinent positives are indicated in the HPI above.  All other systems are negative.  Vital Signs: BP 105/66 (BP Location: Left Arm)   Pulse 69   Temp 98.3 F (36.8 C) (Axillary)   Resp 18   Wt 206 lb 12.7 oz (93.8 kg)   SpO2 100%   BMI 26.55 kg/m    Physical Exam Vitals reviewed.  Constitutional:      General: He is not in acute distress.    Appearance: He is not ill-appearing.  HENT:     Head: Normocephalic and atraumatic.     Mouth/Throat:     Mouth: Mucous membranes are moist.     Pharynx: Oropharynx is clear.  Cardiovascular:     Rate and Rhythm: Normal rate and regular rhythm.     Heart sounds: Normal heart sounds.  Pulmonary:     Effort: Pulmonary effort is normal.     Breath sounds: Normal breath sounds.  Abdominal:     General: Abdomen is flat. Bowel sounds are normal.     Palpations: Abdomen is soft.  Musculoskeletal:      Cervical back: Neck supple.  Skin:    General: Skin is warm and dry.     Coloration: Skin is not jaundiced.     MD Evaluation Airway: WNL Heart: WNL Abdomen: WNL Chest/ Lungs: WNL ASA  Classification: 3 Mallampati/Airway Score: Two  Imaging: MR FOOT RIGHT WO CONTRAST Result Date: 02/03/2024 CLINICAL DATA:  Right foot ulcer.  Concern for osteomyelitis. EXAM: MRI OF THE RIGHT FOREFOOT WITHOUT CONTRAST TECHNIQUE: Multiplanar, multisequence MR imaging of the right forefoot was performed. No intravenous contrast was administered. COMPARISON:  Right foot radiographs dated 05/06/2023. MRI of the right foot dated 02/06/2015. FINDINGS: Bones/Joint/Cartilage Soft tissue ulceration at the lateral plantar forefoot, extends to the level of the underlying fifth MTP joint. Marrow edema with corresponding T1 hypointensity of the fifth proximal phalanx and the fifth metatarsal head and neck with associated areas of cortical indistinctness is compatible with osteomyelitis. No marrow signal abnormality identified elsewhere to suggest osteomyelitis. 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 No significant joint effusion. No fracture or dislocation. Mild degenerative arthropathy of the midfoot. Ligaments Lisfranc ligament is intact. Muscles and Tendons Fatty atrophy of the intrinsic musculature of the foot, likely reflects chronic denervation changes. No significant tenosynovitis. Soft tissue Soft tissue ulceration at the lateral plantar forefoot at the level of the fifth MTP joint  with surrounding soft tissue thickening and edema. No loculated fluid collection. Postoperative changes related to prior amputations of the first and second toes. IMPRESSION: 1. Osteomyelitis of the fifth proximal phalanx and the fifth metatarsal head and neck with overlying soft tissue ulceration at the lateral plantar forefoot. No loculated fluid collection. 2. Postoperative changes related to amputation of the first  and second toes. 3. Chronic denervation changes of the intrinsic foot musculature. Electronically Signed   By: Harrietta Sherry M.D.   On: 02/03/2024 17:08   DG Chest 2 View Result Date: 02/03/2024 CLINICAL DATA:  Shortness of breath EXAM: CHEST - 2 VIEW COMPARISON:  11/01/2023 FINDINGS: Similar low lung volumes with cardiomegaly, vascular congestion, and basilar opacities favored to be atelectasis. Suspect small bilateral pleural effusions. No pneumothorax. Trachea midline. Degenerative changes of the spine. Very limited lateral view because of technique. IMPRESSION: 1. Cardiomegaly with vascular congestion and basilar atelectasis. 2. Suspect small bilateral pleural effusions. Electronically Signed   By: CHRISTELLA.  Shick M.D.   On: 02/03/2024 10:56    Labs:  CBC: Recent Labs    02/04/24 0318 02/05/24 0929 02/06/24 0832 02/07/24 0943  WBC 7.5 10.9* 9.4 9.4  HGB 8.9* 9.4* 8.5* 8.1*  HCT 29.2* 30.1* 27.5* 27.3*  PLT 141* 128* 122* 113*    COAGS: Recent Labs    05/11/23 2310  INR 1.2  APTT 44*    BMP: Recent Labs    02/05/24 0929 02/06/24 0832 02/07/24 0943 02/08/24 0338  NA 146* 146* 145 146*  K 4.7 4.5 4.3 4.2  CL 115* 114* 114* 116*  CO2 16* 17* 15* 18*  GLUCOSE 117* 120* 126* 129*  BUN 120* 121* 122* 128*  CALCIUM  8.6* 8.3* 8.1* 8.2*  CREATININE 5.34* 5.10* 5.69* 6.24*  GFRNONAA 10* 11* 10* 9*    LIVER FUNCTION TESTS: Recent Labs    11/15/23 0643 01/01/24 1947 01/02/24 0110 02/04/24 0318 02/06/24 0832 02/07/24 0943 02/08/24 0338  BILITOT 0.6 0.5 0.8 1.3*  --   --   --   AST 24 30 39 32  --   --   --   ALT 18 30 33 18  --   --   --   ALKPHOS 81 97 105 140*  --   --   --   PROT 7.3 7.5 7.8 7.7  --   --   --   ALBUMIN  2.7* 2.4* 2.6* 2.7* 2.5* 2.4* 2.2*    TUMOR MARKERS: No results for input(s): AFPTM, CEA, CA199, CHROMGRNA in the last 8760 hours.  Assessment and Plan: 78 y.o. male with AKI on CKD who is in need of long term HD access.   NPO since  MN  VSS CBC yesterday stable Allergies reviewed 2g ancef  signed and held   Risks and benefits discussed with the patient's daughter Navi Erber including, but not limited to bleeding, infection, vascular injury, pneumothorax which may require chest tube placement, air embolism or even death  All of the daughter's questions were answered, she is agreeable to proceed. Consent signed and in IR.   Thank you for this interesting consult.  I greatly enjoyed meeting LAMIN CHANDLEY and look forward to participating in their care.  A copy of this report was sent to the requesting provider on this date.  Electronically Signed: Toya VEAR Cousin, PA-C 02/08/2024, 11:17 AM   I spent a total of 40 Minutes    in face to face in clinical consultation, greater than 50% of which was counseling/coordinating care  for tunneled HD cath placement.   This chart was dictated using voice recognition software.  Despite best efforts to proofread,  errors can occur which can change the documentation meaning.

## 2024-02-08 NOTE — Progress Notes (Signed)
   02/08/24 1800  Spiritual Encounters  Type of Visit Initial  Care provided to: [facility] partners present during encounter Nurse  Referral source Chaplain team  Reason for visit Urgent spiritual support  OnCall Visit No   Chaplain met with family member, David [name]. She expressed her feelings in this difficult time and pondered what's next in a period of silence. Her friend was also present to support [name]. They asked for prayer, together we prayed for [name] and and the family in this time of uncertainty.   I provided a non- anxious listening presence for [name] to express herself in a safe space.  I also provided refreshment for the two ladies as they plan to stay for a while. I advised [name] if she wished to speak with a chaplain later to notify their nurse and someone would respond.[name] Carley Waddell Elia  [facility]  [phone]

## 2024-02-08 NOTE — Progress Notes (Signed)
 Speech Language Pathology Treatment: Dysphagia  Patient Details Name: David [name] MRN: [id number] DOB: 19-Oct-1945 Today's Date: 02/08/2024 Time: 8973-8962 SLP Time Calculation (min) (ACUTE ONLY): 11 min  Assessment / Plan / Recommendation Clinical Impression  F/u after yesterday's swallow assessment. Pt NPO for procedure but RN present to give critical meds.  Assisted with thickening water  to nectar and added crushed meds. Pt required frequent cues to awaken and focus attention - lethargic and inconsistently followed commands. Reluctant to take anything - finally accepted meds with cues needed to swallow. Swallow was palpable; there were no overt s/s of aspiration, but he will need more thorough f/u with a range of consistencies after procedure.  D/W RN.    HPI HPI: Pt is a 78 y.o. male who presented from skilled facility for worsening shortness of breath for past 2 weeks. CXR 02/03/24: Cardiomegaly with vascular congestion and basilar atelectasis, possible small bilateral pleural effusions.PMH: combined systolic and diastolic CHF with reduced EF of 30 to 35%, essential hypertension, CKD stage IV, chronic microcytic anemia, CAD s/p coronary stents, diabetes mellitus type 2, chronic diabetic foot ulcers in bilateral lower extremities. BSE 08/11/18:  normal swallow function; regular texture diet and thin liquids recommended.      SLP Plan  Continue with current plan of care          Recommendations  Diet recommendations: Dysphagia 1 (puree);Nectar-thick liquid Liquids provided via: Cup Medication Administration: Crushed with puree Supervision: Staff to assist with self feeding Compensations: Slow rate;Small sips/bites;Follow solids with liquid Postural Changes and/or Swallow Maneuvers: Seated upright 90 degrees                  Oral care BID   Frequent or constant Supervision/Assistance Dysphagia, unspecified (R13.10)     Continue with current plan of care    Peggi Yono L.  [name], MA CCC/SLP Clinical Specialist - Acute Care SLP Acute Rehabilitation Services Office number [phone]  [name] Palma Laurice  02/08/2024, 10:39 AM

## 2024-02-08 NOTE — Sedation Documentation (Addendum)
 Pt. To IR for HD catheter placement as ordered. Pt. From 5Z96. Arousable with moan/groan but followed simple commands and answered simple questions with this RN. Pt. Yelled spontaneously in IR bay but would respond with I'm fine when questioned by this RN.   Pt. To IR suite at 1314, BP 110/65 with HR of 67 and baseline LOC. 1mg  Versed  given for anxiety at 1323 preprocedure. Time Out at 1340 with Dr. Luverne at bedside. BP 102/61 with HR 62 and fentanyl  given at this time with pt. Baseline LOC. 0.5 Versed  given at 1343 with BP 106/61 and HR 57 with pt. Baseline LOC. At 1345, HR 46 and BP 95/56.   At 1347, pt. Brady HR to asystole during HD catheter wire placement after ectopy noted on cardiac monitor. No recovery in HR/rhythm, code called, compressions and BVM breaths started at this time. Narcan  and Romazicon  given by this RN, no change in status post reversal. Code team/Rapid Response RN Leonor arrived at 1349 and ACLS protocol followed. Anesthesia team arrived at 1351. Pt. Successfully intubated at 1351. ROSC achieved at 1356.   Pt. Daughter, Chloe, notified by Dr. Luverne by phone. Pt. Transferred to 7F86 at 1405 with bedside report given to  [facility], [place]. Valuables in IR noted were pillow x1, ball cap x1, and gloves x2.

## 2024-02-08 NOTE — IPAL (Signed)
  Interdisciplinary Goals of Care Family Meeting   Date carried out: 02/08/2024  Location of the meeting: Conference room  Member's involved: Physician and Family Member or next of kin  Durable Power of Attorney or acting medical decision maker: Wife, HCPOA    Discussion: We discussed goals of care for David [name] .  We discussed his chronic co-morbidities including his congestive heart failure as well as his progressive renal disease. With his recent cardiac arrest x 2, he is at high risk for recurrent arrest including when we initiate dialysis. After addressing questions with his wife, cousin and daughter, decision was made by  [facility] for DNR but continue current measures of care.  Code status:   Code Status: Do not attempt resuscitation (DNR) PRE-ARREST INTERVENTIONS DESIRED   Disposition: Continue current acute care  Time spent for the meeting: 35 min    Makynlee Kressin Slater Staff, MD  02/08/2024, 5:30 PM

## 2024-02-08 NOTE — Procedures (Signed)
 Central Venous Catheter Insertion Procedure Note  David [name]  [id number]  04/18/1946  Date:02/08/24  Time:6:35 PM   Provider Performing:Cashius Grandstaff LOISE Blush   Procedure: Insertion of Non-tunneled Central Venous Catheter(36556) with US  guidance (23062)   Indication(s) Medication administration and Hemodialysis  Consent Unable to obtain consent due to emergent nature of procedure.  Anesthesia Topical only with 1% lidocaine    Timeout Verified patient identification, verified procedure, site/side was marked, verified correct patient position, special equipment/implants available, medications/allergies/relevant history reviewed, required imaging and test results available.  Sterile Technique Maximal sterile technique including full sterile barrier drape, hand hygiene, sterile gown, sterile gloves, mask, hair covering, sterile ultrasound probe cover (if used).  Procedure Description Area of catheter insertion was cleaned with chlorhexidine  and draped in sterile fashion.  With real-time ultrasound guidance a HD catheter was placed into the left femoral vein. Nonpulsatile blood flow and easy flushing noted in all ports.  The catheter was sutured in place and sterile dressing applied.  Complications/Tolerance None; patient tolerated the procedure well. Chest x-ray is not ordered for femoral cannulation.  EBL 10 mL  Specimen(s) None   Rexene LOISE Blush, [place] [facility] 02/08/24 6:36 PM  Please see [email or web address] for pager details.  From 7A-7P if no response, please call [phone] After hours, please call ELink [phone]

## 2024-02-09 ENCOUNTER — Inpatient Hospital Stay (HOSPITAL_COMMUNITY)

## 2024-02-09 DIAGNOSIS — I4891 Unspecified atrial fibrillation: Secondary | ICD-10-CM

## 2024-02-09 DIAGNOSIS — I272 Pulmonary hypertension, unspecified: Secondary | ICD-10-CM | POA: Diagnosis not present

## 2024-02-09 DIAGNOSIS — I5033 Acute on chronic diastolic (congestive) heart failure: Secondary | ICD-10-CM | POA: Diagnosis not present

## 2024-02-09 DIAGNOSIS — I469 Cardiac arrest, cause unspecified: Secondary | ICD-10-CM

## 2024-02-09 DIAGNOSIS — J9621 Acute and chronic respiratory failure with hypoxia: Secondary | ICD-10-CM

## 2024-02-09 DIAGNOSIS — I70261 Atherosclerosis of native arteries of extremities with gangrene, right leg: Secondary | ICD-10-CM | POA: Diagnosis not present

## 2024-02-09 DIAGNOSIS — D631 Anemia in chronic kidney disease: Secondary | ICD-10-CM

## 2024-02-09 DIAGNOSIS — R579 Shock, unspecified: Secondary | ICD-10-CM

## 2024-02-09 LAB — RENAL FUNCTION PANEL
Albumin: 2 g/dL — ABNORMAL LOW (ref 3.5–5.0)
Albumin: 2 g/dL — ABNORMAL LOW (ref 3.5–5.0)
Anion gap: 15 (ref 5–15)
Anion gap: 13 (ref 5–15)
BUN: 83 mg/dL — ABNORMAL HIGH (ref 8–23)
BUN: 51 mg/dL — ABNORMAL HIGH (ref 8–23)
CO2: 17 mmol/L — ABNORMAL LOW (ref 22–32)
CO2: 19 mmol/L — ABNORMAL LOW (ref 22–32)
Calcium: 8.3 mg/dL — ABNORMAL LOW (ref 8.9–10.3)
Calcium: 7.9 mg/dL — ABNORMAL LOW (ref 8.9–10.3)
Chloride: 112 mmol/L — ABNORMAL HIGH (ref 98–111)
Chloride: 108 mmol/L (ref 98–111)
Creatinine, Ser: 4.23 mg/dL — ABNORMAL HIGH (ref 0.61–1.24)
Creatinine, Ser: 2.87 mg/dL — ABNORMAL HIGH (ref 0.61–1.24)
GFR, Estimated: 14 mL/min — ABNORMAL LOW (ref >60)
GFR, Estimated: 22 mL/min — ABNORMAL LOW (ref >60)
Glucose, Bld: 103 mg/dL — ABNORMAL HIGH (ref 70–99)
Glucose, Bld: 150 mg/dL — ABNORMAL HIGH (ref 70–99)
Phosphorus: 2.7 mg/dL (ref 2.5–4.6)
Phosphorus: 2.2 mg/dL — ABNORMAL LOW (ref 2.5–4.6)
Potassium: 3.8 mmol/L (ref 3.5–5.1)
Potassium: 4 mmol/L (ref 3.5–5.1)
Sodium: 144 mmol/L (ref 135–145)
Sodium: 140 mmol/L (ref 135–145)

## 2024-02-09 LAB — GLUCOSE, CAPILLARY
Glucose-Capillary: 105 mg/dL — ABNORMAL HIGH (ref 70–99)
Glucose-Capillary: 101 mg/dL — ABNORMAL HIGH (ref 70–99)
Glucose-Capillary: 140 mg/dL — ABNORMAL HIGH (ref 70–99)
Glucose-Capillary: 116 mg/dL — ABNORMAL HIGH (ref 70–99)
Glucose-Capillary: 124 mg/dL — ABNORMAL HIGH (ref 70–99)
Glucose-Capillary: 131 mg/dL — ABNORMAL HIGH (ref 70–99)

## 2024-02-09 LAB — CBC
HCT: 21.3 % — ABNORMAL LOW (ref 39.0–52.0)
Hemoglobin: 6.8 g/dL — CL (ref 13.0–17.0)
MCH: 33.3 pg (ref 26.0–34.0)
MCHC: 31.9 g/dL (ref 30.0–36.0)
MCV: 104.4 fL — ABNORMAL HIGH (ref 80.0–100.0)
Platelets: 130 K/uL — ABNORMAL LOW (ref 150–400)
RBC: 2.04 MIL/uL — ABNORMAL LOW (ref 4.22–5.81)
RDW: 25.9 % — ABNORMAL HIGH (ref 11.5–15.5)
WBC: 8.6 K/uL (ref 4.0–10.5)
nRBC: 0.6 % — ABNORMAL HIGH (ref 0.0–0.2)

## 2024-02-09 LAB — PREPARE RBC (CROSSMATCH)

## 2024-02-09 LAB — MAGNESIUM: Magnesium: 2.9 mg/dL — ABNORMAL HIGH (ref 1.7–2.4)

## 2024-02-09 LAB — HEPATITIS B SURFACE ANTIBODY, QUANTITATIVE: Hep B S AB Quant (Post): <3.5 m[IU]/mL — ABNORMAL LOW

## 2024-02-09 LAB — HEMOGLOBIN AND HEMATOCRIT, BLOOD
HCT: 23.8 % — ABNORMAL LOW (ref 39.0–52.0)
Hemoglobin: 7.9 g/dL — ABNORMAL LOW (ref 13.0–17.0)

## 2024-02-09 LAB — VAS US ABI WITH/WO TBI
Left ABI: 0.75
Right ABI: 1.04

## 2024-02-09 MED ORDER — ORAL CARE MOUTH RINSE
15.0000 mL | OROMUCOSAL | Status: DC
  Administered 2024-02-09 – 2024-02-17 (×95): 15 mL via OROMUCOSAL

## 2024-02-09 MED ORDER — ORAL CARE MOUTH RINSE: 15.0000 mL | OROMUCOSAL | Status: DC | PRN

## 2024-02-09 MED ORDER — ADULT MULTIVITAMIN W/MINERALS CH
1.0000 | ORAL_TABLET | Freq: Every day | ORAL | Status: DC
  Administered 2024-02-09 – 2024-02-17 (×9): 1
  Filled 2024-02-09 (×9): qty 1

## 2024-02-09 MED ORDER — VITAL 1.5 CAL PO LIQD
1000.0000 mL | ORAL | Status: DC
  Administered 2024-02-09 – 2024-02-16 (×7): 1000 mL
  Filled 2024-02-09 (×2): qty 1000

## 2024-02-09 MED ORDER — FENTANYL BOLUS VIA INFUSION
50.0000 ug | INTRAVENOUS | Status: DC | PRN
  Administered 2024-02-09 – 2024-02-10 (×6): 50 ug via INTRAVENOUS

## 2024-02-09 MED ORDER — JUVEN PO PACK
1.0000 | PACK | Freq: Two times a day (BID) | ORAL | Status: DC
  Administered 2024-02-10 – 2024-02-17 (×12): 1
  Filled 2024-02-09 (×13): qty 1

## 2024-02-09 MED ORDER — FREE WATER: 200.0000 mL | Freq: Four times a day (QID) | Status: DC

## 2024-02-09 MED ORDER — POTASSIUM & SODIUM PHOSPHATES 280-160-250 MG PO PACK
2.0000 | PACK | ORAL | Status: AC
  Administered 2024-02-09 (×4): 2
  Filled 2024-02-09 (×2): qty 1
  Filled 2024-02-09 (×3): qty 2

## 2024-02-09 MED ORDER — SODIUM CHLORIDE 0.9% IV SOLUTION: Freq: Once | INTRAVENOUS | Status: DC

## 2024-02-09 MED ORDER — THIAMINE MONONITRATE 100 MG PO TABS
100.0000 mg | ORAL_TABLET | Freq: Every day | ORAL | Status: AC
  Administered 2024-02-09 – 2024-02-13 (×5): 100 mg
  Filled 2024-02-09 (×5): qty 1

## 2024-02-09 MED ORDER — PROSOURCE TF20 ENFIT COMPATIBL EN LIQD
60.0000 mL | Freq: Two times a day (BID) | ENTERAL | Status: DC
Start: 2024-02-09 — End: 2024-02-18
  Administered 2024-02-09 – 2024-02-17 (×15): 60 mL
  Filled 2024-02-09 (×16): qty 60

## 2024-02-09 NOTE — TOC Progression Note (Signed)
 Transition of Care [facility]) - Progression Note    Patient Details  Name: David [name] MRN: [id number] Date of Birth: 1946-01-26  Transition of Care [facility]) CM/SW Contact  Lauraine FORBES Saa, LCSWA Phone Number: 02/09/2024, 3:18 PM  Clinical Narrative:     3:19 PM Per progressions, patient is currently intubated with feeding tube. Patient is not yet medically ready for discharge. CSW will submit FL2 to [facility] SN[facility] LTC upon extubation. CSW will continue to follow and be available to assist.  Expected Discharge Plan: Long Term Nursing Home Barriers to Discharge: Continued Medical Work up               Expected Discharge Plan and Services In-house Referral: Clinical Social Work   Post Acute Care Choice: Skilled Nursing Facility Living arrangements for the past 2 months: Skilled Nursing Facility                                       Social Drivers of Health (SDOH) Interventions SDOH Screenings   Food Insecurity: Patient Unable To Answer (02/04/2024)  Housing: Patient Unable To Answer (02/04/2024)  Transportation Needs: No Transportation Needs (01/02/2024)  Utilities: Not At Risk (01/02/2024)  Alcohol Screen: Low Risk  (05/18/2023)  Financial Resource Strain: Low Risk  (05/18/2023)  Physical Activity: Inactive (08/20/2017)  Social Connections: Socially Integrated (01/02/2024)  Stress: No Stress Concern Present (08/20/2017)  Tobacco Use: Medium Risk (02/08/2024)    Readmission Risk Interventions    11/09/2023   11:30 AM 11/06/2023    8:44 AM  Readmission Risk Prevention Plan  Transportation Screening Complete Complete  Medication Review (RN Care Manager) Complete Complete  PCP or Specialist appointment within 3-5 days of discharge Complete   HRI or Home Care Consult Complete Complete  SW Recovery Care/Counseling Consult Complete Complete  Palliative Care Screening Not Applicable Not Applicable  Skilled Nursing Facility Complete Not Applicable

## 2024-02-09 NOTE — Progress Notes (Signed)
 Pharmacy Electrolyte Replacement  Recent Labs:  Recent Labs    02/09/24 0437  K 3.8  MG 2.9*  PHOS 2.7  CREATININE 4.23*    Low Critical Values (K </= 2.5, Phos </= 1, Mg </= 1) Present: None  MD Contacted: [place]  Plan: Replace Phos-Nak 2 packets per tube every 4 hours x4 doses per CRRT Phosphorus Pharmacy Replacement protocol.   Harlene Boga, PharmD, BCPS, BCCCP Clinical Pharmacist Please refer to [facility] for [facility] Pharmacy numbers 02/09/2024, 9:01 AM

## 2024-02-09 NOTE — Progress Notes (Signed)
 Initial Nutrition Assessment  DOCUMENTATION CODES:  Severe malnutrition in context of chronic illness  INTERVENTION:  Initiate tube feeding via NGT: Vital 1.5 at 60 ml/h (1440 ml per day) Start at 20 and advance by 10mL every 12 hours to reach goal Prosource TF20 60 ml BID Provides 2320 kcal, 137 gm protein, 1100 ml free water  daily MVI daily Pt is at risk for refeeding syndrome given severe malnutrition. Monitor magnesium  and phosphorus daily x 4 days, MD to replete as needed. 100mg  thiamine  x 5 days 1 packet Juven BID, each packet provides 95 calories, 2.5 grams of protein (collagen) + micronutrients to support wound healing   NUTRITION DIAGNOSIS:  Severe Malnutrition related to chronic illness (CHF, wounds) as evidenced by severe fat depletion, severe muscle depletion.  GOAL:  Patient will meet greater than or equal to 90% of their needs  MONITOR:  TF tolerance, I & O's, Vent status, Labs  REASON FOR ASSESSMENT:  Consult Assessment of nutrition requirement/status (CRRT)  ASSESSMENT:  Pt with hx of HTN, GERD, CHF, CAD, DM type 2, gastroparesis, CKD4, and anemia presented to ED from [facility] with SOB. Workup suggestive of CHF exacerbation.  9/17 - presented to ED from [facility] 9/21 - SLP BSE, DYS1/Nectar 9/22 - tunneled HD catheter attempted in IR but py arrested, code blue called, intubated and transferred to ICU  Patient is currently intubated on ventilator support. Wife present at bedside. Discussed intake prior to intubation. Endorses poor intake which she states is very out of character for him as he normally enjoys eating. Reports that she has noticed that pt appears to be losing weight but that pt has not complained of poor energy or feeling poorly recently. States pt rarely complains about anything.   Did discuss that with pt's poor PO intake this admission, muscle and fat deficits, and increased needs with critical illness, he would benefit from enteral  feeds. Wife agreeable. NGT is in place. Pt discussed during ICU rounds and with RN and MD ok to initiate TF.   MV: 13.8 L/min Temp (24hrs), Avg:98 F (36.7 C), Min:94.3 F (34.6 C), Max:99.1 F (37.3 C) MAP (Art Line):  Admit weight: 95.3 kg  Current weight: 95.7 kg    Intake/Output Summary (Last 24 hours) at 02/09/2024 1517 Last data filed at 02/09/2024 1400 Gross per 24 hour  Intake 2034.34 ml  Output 1167.3 ml  Net 867.04 ml  Net IO Since Admission: 1,382.92 mL [02/09/24 1517]  Drains/Lines: Art Line, left femoral Non tunneled HD cath, left femoral triple lumen NGT (gastric) UOP out yesterday  Average Meal Intake: 9/17-9/21: 4% intake x 7 recorded meals  Nutritionally Relevant Medications: Scheduled Meds:  atorvastatin   80 mg Per Tube QHS   dicyclomine   10 mg Per Tube TID AC & HS   docusate  100 mg Oral BID   NEPRO CARB STEADY  237 mL Per Tube BID BM   linezolid   600 mg Per Tube Q12H   pantoprazole   40 mg Intravenous Daily   potassium & sodium phosphates   2 packet Per Tube Q4H   sodium bicarbonate   1,300 mg Per Tube BID   Continuous Infusions:  ampicillin -sulbactam (UNASYN ) IV 3 g (02/09/24 1007)   norepinephrine  (LEVOPHED ) Adult infusion 1 mcg/min (02/09/24 0900)   PRN Meds: bisacodyl , ondansetron , polyethylene glycol  Labs Reviewed: BUN 83, creatinine 4.23 Magnesium  2.9 Chloride 112 CBG ranges from 101-151 mg/dL over the last 24 hours HgbA1c 6.0% (10/30/23)  NUTRITION - FOCUSED PHYSICAL EXAM: Flowsheet  Row Most Recent Value  Orbital Region Severe depletion  Upper Arm Region Severe depletion  Thoracic and Lumbar Region Moderate depletion  Buccal Region Severe depletion  Temple Region Severe depletion  Clavicle Bone Region Moderate depletion  Clavicle and Acromion Bone Region Severe depletion  Scapular Bone Region Moderate depletion  Dorsal Hand Unable to assess  [mittens]  Patellar Region Severe depletion  Anterior Thigh Region Moderate  depletion  Posterior Calf Region Moderate depletion  Edema (RD Assessment) Mild  [BLE]  Hair Reviewed  Eyes Reviewed  Mouth Reviewed  Skin Reviewed  Nails Unable to assess  [mittens]    Diet Order:   Diet Order             Diet NPO time specified  Diet effective midnight                   EDUCATION NEEDS:  Education needs have been addressed  Skin:  Skin Assessment: Skin Integrity Issues: Per WOC 9/18: Unstageable pressure injury: - Left heel with 100% dry eschar, 3X3cm - Right heel with100% dry eschar, 2X2cm Full thickness wound with osteomyelitis - Right outer foot 5X3cm, red with bloody drainage to anteror foot, eschar to plantar foot. Noted Orthopedics recommended amputation this admission, pt refused  Additional wounds per flowsheet: Diabetic Toe Ulcer: - 3rd toe left foot (1 x 1 x 0.01) Stage 2: - left buttock (2x1x0.15)  Last BM:  9/22 - type 7  Height:  Ht Readings from Last 1 Encounters:  02/08/24 6' 2 (1.88 m)    Weight:  Wt Readings from Last 1 Encounters:  02/09/24 95.7 kg    Ideal Body Weight:  86.4 kg  BMI:  Body mass index is 27.09 kg/m.  Estimated Nutritional Needs:  Kcal:  2300-2500 kcal/d Protein:  120-140g/d Fluid:  2.3L/d    Vernell Lukes, RD, LDN, CNSC Registered Dietitian II Please reach out via secure chat

## 2024-02-09 NOTE — Progress Notes (Signed)
 ABI has been completed.   Results can be found under chart review under CV PROC. 02/09/2024 5:36 PM Ronney Honeywell RVT, RDMS

## 2024-02-09 NOTE — Progress Notes (Signed)
 NAME:  David [name], MRN:  [id number], DOB:  [birth year], LOS: 6 ADMISSION DATE:  02/03/2024, CONSULTATION DATE:  02/08/2024 REFERRING MD:  Dr Noralee, CHIEF COMPLAINT:  PEA Arrest   History of Present Illness:  David [name] is a 78 year old gentleman with PMH of chronic combined systolic and diastolic heart failure (EF 30-35%), essential HTN, chronic hypoxic respiratory failure (on baseline 3L O2), CKD stage IV, CAD s/p , T2DM, chronic diabetic foot ulcers in bilateral lower extremities who presented to the hospital from SNF on 9/17 for worsening dyspnea for the previous 2 weeks. In the ED a CXR demonstrated pulmonary edema, BNP was elevated to 704, Cr was elevated to 5.01. He was admitted to [facility] for heart failure exacerbation.    Nephrology was consulted for AKI on CKD versus progression of CKD and ultimately decision was made to pursue HD for volume management. He went to IR on 9/22 for dialysis catheter placement. During advancement of the guidewire in the right internal jugular he became bradycardic and subsequently became unresponsive found to be in PEA arrest. CPR was started and procedure was aborted. He was intubated during the arrest. After 3 minutes of CPR, epi, calcium , and sodium bicarbonate  pushes ROSC was achieved. He was transferred to [facility] ICU for further management.   Pertinent  Medical History   Past Medical History:  Diagnosis Date   Anemia    Arthritis    CAD in native artery    s/p stent in 11/18   CHF (congestive heart failure) (HCC)    normal echo in 11/18   CKD (chronic kidney disease) stage 3, GFR 30-59 ml/min (HCC)    DDD (degenerative disc disease), cervical    Diabetes mellitus with complication (HCC)    Type II   Diabetic neuropathy (HCC)    Diabetic retinopathy (HCC)    GERD (gastroesophageal reflux disease)    Glaucoma    Hypertension      Significant Hospital Events: Including procedures, antibiotic start and stop dates in addition to other pertinent  events   9/17 Admitted to [facility] for acute on chronic CHF exacerbation and AKI on CKD stage IV 9/22 IR for dialysis catheter placement, complicated by bradycardia and subsequent PEA arrest while advancing guidewire. ROSC obtained after 3 minutes CPR. He was intubated during the arrest. 9/22 had a second PEA arrest 9/22 arterial line placed, HD catheter placed  Interim History / Subjective:  No overnight events 2 PEA arrest on 02/08/2024  Objective    Blood pressure (!) 162/89, pulse 69, temperature 98.8 F (37.1 C), resp. rate (!) 22, height 6' 2 (1.88 m), weight 95.7 kg, SpO2 100%.    Vent Mode: PRVC FiO2 (%):  [50 %-100 %] 60 % Set Rate:  [22 bmp] 22 bmp Vt Set:  [650 mL] 650 mL PEEP:  [8 cmH20-10 cmH20] 8 cmH20 Plateau Pressure:  [31 cmH20-35 cmH20] 33 cmH20   Intake/Output Summary (Last 24 hours) at 02/09/2024 0718 Last data filed at 02/09/2024 0700 Gross per 24 hour  Intake 1228.84 ml  Output 644 ml  Net 584.84 ml   Filed Weights   02/07/24 0616 02/08/24 0530 02/09/24 0438  Weight: 97.1 kg 93.8 kg 95.7 kg    Examination: General: Chronically ill-appearing, HENT: Pupils equal Lungs: Bilateral rhonchi Cardiovascular: S1-S2 appreciated Abdomen: Soft, bowel sounds appreciated Extremities: No clubbing, chronic skin changes Neuro: sedated GU:   I reviewed last 24 h vitals and pain scores, last 48 h intake and output, last 24 h  labs and trends, and last 24 h imaging results. Resolved problem list   Assessment and Plan   S/p cardiac arrest PEA x 2 - Successfully resuscitated - Maintain normothermia, avoid fevers - Neuroprotective measures- normothermia, euglycemia, HOB greater than 30, head in neutral alignment, normocapnia, normoxia  Mixed shock Background history of combined systolic and diastolic heart failure - Continue pressors to maintain MAP greater than 65  Pulmonary hypertension - Multifactorial, likely secondary to group 2  Atrial fibrillation -  Continue monitoring - Replete electrolytes  Acute on chronic hypoxemic respiratory failure - Continue mechanical ventilation  -Target TVol 6-8cc/kgIBW -Target Plateau Pressure < 30cm H20 -Target driving pressure less than 15 cm of water  -Target PaO2 55-65: titrate PEEP/FiO2 per protocol -Ventilator associated pneumonia prevention protocol  Acute kidney injury on chronic kidney disease On CRRT and tolerating it okay  Right foot osteomyelitis Progressive gangrenous changes of both feet, Gangrene of left heel - Continue antibiotic coverage with linezolid  and Unasyn  - Surgery had recommended amputation prior to patient's cardiac arrest  Anemia of chronic disease - Continue to monitor - Received 1 unit PRBC 02/09/2024  Type 2 diabetes - Continue SSI  Guarded prognosis Risk of decompensation remains very high  Updated spouse at bedside  He is DNR status  Labs   CBC: Recent Labs  Lab 02/03/24 1113 02/03/24 2013 02/05/24 0929 02/06/24 0832 02/07/24 0943 02/08/24 1446 02/08/24 1513 02/09/24 0437  WBC 7.7   < > 10.9* 9.4 9.4  --  11.7* 8.6  NEUTROABS 6.8  --   --   --   --   --   --   --   HGB 9.6*   < > 9.4* 8.5* 8.1* 8.8* 8.3* 6.8*  HCT 31.9*   < > 30.1* 27.5* 27.3* 26.0* 27.9* 21.3*  MCV 111.1*   < > 108.7* 109.1* 112.3*  --  113.0* 104.4*  PLT 155   < > 128* 122* 113*  --  121* 130*   < > = values in this interval not displayed.    Basic Metabolic Panel: Recent Labs  Lab 02/04/24 0318 02/05/24 0929 02/06/24 0832 02/07/24 0943 02/08/24 0338 02/08/24 1446 02/08/24 1513 02/09/24 0437  NA 140   < > 146* 145 146* 149* 147* 144  K 4.9   < > 4.5 4.3 4.2 3.9 4.6 3.8  CL 106   < > 114* 114* 116*  --  117* 112*  CO2 20*   < > 17* 15* 18*  --  18* 17*  GLUCOSE 98   < > 120* 126* 129*  --  139* 103*  BUN 126*   < > 121* 122* 128*  --  122* 83*  CREATININE 5.31*   < > 5.10* 5.69* 6.24*  --  6.21* 4.23*  CALCIUM  8.8*   < > 8.3* 8.1* 8.2*  --  9.7 8.3*  MG  --   --    --   --   --   --  3.1* 2.9*  PHOS 5.5*  --  4.7* 5.3* 5.7*  --   --  2.7   < > = values in this interval not displayed.   GFR: Estimated Creatinine Clearance: 17.3 mL/min (A) (by C-G formula based on SCr of 4.23 mg/dL (H)). Recent Labs  Lab 02/03/24 1720 02/03/24 2013 02/06/24 0832 02/07/24 0943 02/08/24 1513 02/09/24 0437  WBC  --    < > 9.4 9.4 11.7* 8.6  LATICACIDVEN 1.3  --   --   --  1.1  --    < > = values in this interval not displayed.    Liver Function Tests: Recent Labs  Lab 02/04/24 0318 02/06/24 0832 02/07/24 0943 02/08/24 0338 02/08/24 1513 02/09/24 0437  AST 32  --   --   --  31  --   ALT 18  --   --   --  19  --   ALKPHOS 140*  --   --   --  102  --   BILITOT 1.3*  --   --   --  1.2  --   PROT 7.7  --   --   --  7.7  --   ALBUMIN  2.7* 2.5* 2.4* 2.2* 2.3* 2.0*   No results for input(s): LIPASE, AMYLASE in the last 168 hours. No results for input(s): AMMONIA in the last 168 hours.  ABG    Component Value Date/Time   PHART 7.352 02/08/2024 1446   PCO2ART 29.9 (L) 02/08/2024 1446   PO2ART 338 (H) 02/08/2024 1446   HCO3 17.0 (L) 02/08/2024 1446   TCO2 18 (L) 02/08/2024 1446   ACIDBASEDEF 8.0 (H) 02/08/2024 1446   O2SAT 100 02/08/2024 1446     Coagulation Profile: No results for input(s): INR, PROTIME in the last 168 hours.  Cardiac Enzymes: No results for input(s): CKTOTAL, CKMB, CKMBINDEX, TROPONINI in the last 168 hours.  HbA1C: Hgb A1c MFr Bld  Date/Time Value Ref Range Status  10/30/2023 02:08 AM 6.0 (H) 4.8 - 5.6 % Final    Comment:    (NOTE) Diagnosis of Diabetes The following HbA1c ranges recommended by the American Diabetes Association (ADA) may be used as an aid in the diagnosis of diabetes mellitus.  Hemoglobin             Suggested A1C NGSP%              Diagnosis  <5.7                   Non Diabetic  5.7-6.4                Pre-Diabetic  >6.4                   Diabetic  <7.0                    Glycemic control for                       adults with diabetes.    05/04/2023 11:05 PM 6.4 (H) 4.8 - 5.6 % Final    Comment:    (NOTE) Pre diabetes:          5.7%-6.4%  Diabetes:              >6.4%  Glycemic control for   <7.0% adults with diabetes     CBG: Recent Labs  Lab 02/08/24 1158 02/08/24 1701 02/08/24 1935 02/08/24 2326 02/09/24 0306  GLUCAP 117* 151* 138* 121* 105*    Review of Systems:   Unable to provide history  Past Medical History:  He,  has a past medical history of Anemia, Arthritis, CAD in native artery, CHF (congestive heart failure) (HCC), CKD (chronic kidney disease) stage 3, GFR 30-59 ml/min (HCC), DDD (degenerative disc disease), cervical, Diabetes mellitus with complication (HCC), Diabetic neuropathy (HCC), Diabetic retinopathy (HCC), GERD (gastroesophageal reflux disease), Glaucoma, and Hypertension.   Surgical History:   Past Surgical History:  Procedure Laterality  Date   AMPUTATION Right 07/09/2016   Procedure: Right Great Toe Amputation at Metatarsophalangeal Joint;  Surgeon: Jerona Harden GAILS, MD;  Location[facility] OR;  Service: Orthopedics;  Laterality: Right;   AMPUTATION Right 05/28/2018   Procedure: RIGHT SECOND TOE AMPUTATION;  Surgeon: Harden Jerona GAILS, MD;  Location: [facility] OR;  Service: Orthopedics;  Laterality: Right;   AMPUTATION Left 08/03/2020   Procedure: LEFT 5TH RAY AMPUTATION;  Surgeon: Harden Jerona GAILS, MD;  Location: [facility] OR;  Service: Orthopedics;  Laterality: Left;   BACK SURGERY     4   CARDIAC CATHETERIZATION     CORONARY STENT INTERVENTION N/A 04/06/2017   Procedure: CORONARY STENT INTERVENTION;  Surgeon: Elmira Newman PARAS, MD;  Location: [facility] INVASIVE CV LAB;  Service: Cardiovascular;  Laterality: N/A;   CORONARY STENT INTERVENTION N/A 04/07/2017   Procedure: CORONARY STENT INTERVENTION;  Surgeon: Elmira Newman PARAS, MD;  Location: [facility] INVASIVE CV LAB;  Service: Cardiovascular;  Laterality: N/A;   CYST EXCISION     on Back    ENDOTRACHEAL INTUBATION EMERGENT  08/07/2018       ESOPHAGOGASTRODUODENOSCOPY Left 11/07/2023   Procedure: EGD (ESOPHAGOGASTRODUODENOSCOPY);  Surgeon: Rosalie Kitchens, MD;  Location: [facility];  Service: Gastroenterology;  Laterality: Left;   IR TUNNELED CENTRAL VENOUS CATH PLC W IMG  02/08/2024   JOINT REPLACEMENT     LAPAROSCOPIC ABDOMINAL EXPLORATION N/A 11/26/2017   Procedure: LAPAROSCOPIC ABDOMINAL EXPLORATION, DRAINAGE OF APPENDICEAL ABCESS. PLACEMENT OF DRAIN;  Surgeon: Ethyl Lenis, MD;  Location: [facility] OR;  Service: General;  Laterality: N/A;   RIGHT/LEFT HEART CATH AND CORONARY ANGIOGRAPHY N/A 04/06/2017   Procedure: RIGHT/LEFT HEART CATH AND CORONARY ANGIOGRAPHY;  Surgeon: Elmira Newman PARAS, MD;  Location: [facility] INVASIVE CV LAB;  Service: Cardiovascular;  Laterality: N/A;   TOE SURGERY  05/2018   Left    TOTAL HIP ARTHROPLASTY     Right      Social History:   reports that he quit smoking about 29 years ago. His smoking use included cigarettes. He started smoking about 34 years ago. He has a 1.3 pack-year smoking history. He has been exposed to tobacco smoke. He has never used smokeless tobacco. He reports that he does not currently use alcohol. He reports that he does not use drugs.   Family History:  His family history includes Alzheimer's disease in his father and another family member; Diabetes in an other family member; Diabetes Mellitus II in his father; Hyperlipidemia in an other family member; Hypertension in an other family member; Stroke in an other family member; Thyroid  disease in his mother.   Allergies Allergies  Allergen Reactions   Nsaids Other (See Comments)    CKD stage 3     The patient is critically ill with multiple organ systems failure and requires high complexity decision making for assessment and support, frequent evaluation and titration of therapies, application of advanced monitoring technologies and extensive interpretation of multiple databases. Critical  Care Time devoted to patient care services described in this note independent of APP/resident time (if applicable)  is 33 minutes.   Jennet Epley MD [facility] Personal pager: See Amion If unanswered, please page CCM On-call: [phone]

## 2024-02-09 NOTE — Progress Notes (Signed)
 eLink Physician-Brief Progress Note Patient Name: David [name] DOB: 08-18-1945 MRN: [id number]   Date of Service  02/09/2024  HPI/Events of Note  Received request for CBC  eICU Interventions  Reviewed previous labs and abnormalities noted Reasonable to repeat AM CBC     Intervention Category Minor Interventions: Other:;Clinical assessment - ordering diagnostic tests  Damien ONEIDA Grout 02/09/2024, 3:26 AM

## 2024-02-09 NOTE — Progress Notes (Signed)
 SLP Cancellation Note  Patient Details Name: David [name] MRN: [id number] DOB: [birth year]   Cancelled treatment:    Events of yesterday noted; pt now intubated. SLP will sign off and await new orders.  Nahiara Kretzschmar L. [name], MA CCC/SLP Clinical Specialist - Acute Care SLP Acute Rehabilitation Services Office number [phone]        [name] Palma Laurice 02/09/2024, 9:12 AM

## 2024-02-09 NOTE — Plan of Care (Signed)
  Problem: Education: Goal: Knowledge of General Education information will improve Description: Including pain rating scale, medication(s)/side effects and non-pharmacologic comfort measures Outcome: Progressing   Problem: Health Behavior/Discharge Planning: Goal: Ability to manage health-related needs will improve Outcome: Progressing   Problem: Clinical Measurements: Goal: Ability to maintain clinical measurements within normal limits will improve Outcome: Progressing Goal: Will remain free from infection Outcome: Progressing Goal: Diagnostic test results will improve Outcome: Progressing Goal: Respiratory complications will improve Outcome: Progressing Goal: Cardiovascular complication will be avoided Outcome: Progressing   Problem: Coping: Goal: Level of anxiety will decrease Outcome: Progressing   Problem: Elimination: Goal: Will not experience complications related to bowel motility Outcome: Progressing Goal: Will not experience complications related to urinary retention Outcome: Progressing   Problem: Pain Managment: Goal: General experience of comfort will improve and/or be controlled Outcome: Progressing   Problem: Safety: Goal: Ability to remain free from injury will improve Outcome: Progressing   Problem: Skin Integrity: Goal: Risk for impaired skin integrity will decrease Outcome: Progressing   Problem: Fluid Volume: Goal: Ability to maintain a balanced intake and output will improve Outcome: Progressing   Problem: Health Behavior/Discharge Planning: Goal: Ability to identify and utilize available resources and services will improve Outcome: Progressing Goal: Ability to manage health-related needs will improve Outcome: Progressing   Problem: Metabolic: Goal: Ability to maintain appropriate glucose levels will improve Outcome: Progressing   Problem: Nutritional: Goal: Maintenance of adequate nutrition will improve Outcome: Progressing    Problem: Skin Integrity: Goal: Risk for impaired skin integrity will decrease Outcome: Progressing   Problem: Tissue Perfusion: Goal: Adequacy of tissue perfusion will improve Outcome: Progressing

## 2024-02-09 NOTE — Progress Notes (Signed)
 eLink Physician-Brief Progress Note Patient Name: David [name] DOB: [birth year] MRN: [id number]   Date of Service  02/09/2024  HPI/Events of Note  Hgb 6.8 from 8.3 No report of active bleeding  eICU Interventions  Ordered to transfuse 1 unit PRBC BSRN to secure consent for transfusion     Intervention Category Intermediate Interventions: Diagnostic test evaluation  David [name] 02/09/2024, 5:26 AM

## 2024-02-09 NOTE — Progress Notes (Signed)
 Admit: 02/03/2024 LOS: 6  46M with AKI on CKD4 vs progressive CKD; Cardiac arrest x2 02/08/24  Current CRRT Prescription: Start Date: 02/08/24 Catheter: L Femoral HD Temp Cath placed 9/22 by CCM BFR: 250 Pre Blood Pump: 1000 4K DFR: 1000 4K Replacement Rate: 400 4K Goal UF: Net even Anticoagulation: None Clotting: not since initiation  Subjective:  Started on RRT yesterday evening after left femoral HD catheter placed K3.8, phosphorus 2.7 On low-dose norepinephrine  Wife at bedside, updated Case discussed with primary RN and CCM MD  09/22 0701 - 09/23 0700 In: 1228.8 [I.V.:223.8; NG/GT:705; IV Piggyback:300] Out: 644 [Urine:125]  Filed Weights   02/07/24 0616 02/08/24 0530 02/09/24 0438  Weight: 97.1 kg 93.8 kg 95.7 kg    Scheduled Meds:  sodium chloride    Intravenous Once   aspirin   81 mg Per Tube Daily   atorvastatin   80 mg Per Tube QHS   Chlorhexidine  Gluconate Cloth  6 each Topical Daily   Chlorhexidine  Gluconate Cloth  6 each Topical Q0600   [START ON 02/11/2024] darbepoetin (ARANESP ) injection - DIALYSIS  60 mcg Subcutaneous Q Thu-1800   dicyclomine   10 mg Per Tube TID AC & HS   docusate  100 mg Oral BID   heparin   5,000 Units Subcutaneous Q8H   latanoprost   1 drop Both Eyes QHS   lidocaine  (PF)  20 mL Infiltration Once   linezolid   600 mg Per Tube Q12H   mouth rinse  15 mL Mouth Rinse Q2H   pantoprazole  (PROTONIX ) IV  40 mg Intravenous Daily   potassium & sodium phosphates   2 packet Per Tube Q4H   sertraline   25 mg Per Tube Daily   sodium bicarbonate   1,300 mg Per Tube BID   sodium chloride  flush  3 mL Intravenous Q12H   Continuous Infusions:  ampicillin -sulbactam (UNASYN ) IV Stopped (02/09/24 1037)   fentaNYL  infusion INTRAVENOUS 100 mcg/hr (02/09/24 1100)   norepinephrine  (LEVOPHED ) Adult infusion 1 mcg/min (02/09/24 1100)   prismasol  BGK 4/2.5 400 mL/hr at 02/08/24 1811   prismasol  BGK 4/2.5 1,000 mL/hr at 02/09/24 1011   prismasol  BGK 4/2.5 1,000 mL/hr  at 02/09/24 1011   PRN Meds:.acetaminophen  **OR** acetaminophen , bisacodyl , fentaNYL , heparin , nitroGLYCERIN , ondansetron  **OR** ondansetron  (ZOFRAN ) IV, mouth rinse, polyethylene glycol, sodium chloride , sodium chloride  flush  Current Labs: reviewed   Physical Exam:  Blood pressure 129/74, pulse 63, temperature 98.6 F (37 C), resp. rate (!) 22, height 6' 2 (1.88 m), weight 95.7 kg, SpO2 100%. Intubated and sedated Regular: No rub, normal S1-S2 Coarse breath sounds bilaterally Soft, nontender Lower extremity wounds bandaged Left femoral temporary HD catheter bandaged, clean and intact  A AKI on CKD 4 versus progression of CKD, starting HD during admission on 9/22 PEA arrest 02/08/2024 x2 Urinary retention status post Foley and now on tamsulosin  Hypertension: Blood pressures are soft currently on low-dose norepinephrine  Metabolic acidosis: Dialysis, stop sodium bicarbonate  Anemia, hemoglobin 6.8, transfusion per CCM.  On ESA Foot wounds and osteomyelitis of the fifth proximal phalanx of fifth metatarsal head on the right side on linezolid  and Unasyn  with orthopedics following Chronic DM2 Hypophosphatemia on repletion protocol  P Continue CRRT at current settings  Medication Issues; Preferred narcotic agents for pain control are hydromorphone , fentanyl , and methadone. Morphine  should not be used.  Baclofen should be avoided Avoid oral sodium phosphate  and magnesium  citrate based laxatives / bowel preps    Bernardino Gasman MD 02/09/2024, 12:32 PM  Recent Labs  Lab 02/07/24 [phone] 02/08/24 0338 02/08/24 1446 02/08/24 1513 02/09/24  0437  NA 145 146* 149* 147* 144  K 4.3 4.2 3.9 4.6 3.8  CL 114* 116*  --  117* 112*  CO2 15* 18*  --  18* 17*  GLUCOSE 126* 129*  --  139* 103*  BUN 122* 128*  --  122* 83*  CREATININE 5.69* 6.24*  --  6.21* 4.23*  CALCIUM  8.1* 8.2*  --  9.7 8.3*  PHOS 5.3* 5.7*  --   --  2.7   Recent Labs  Lab 02/03/24 1113 02/03/24 2013 02/07/24 0943  02/08/24 1446 02/08/24 1513 02/09/24 0437  WBC 7.7   < > 9.4  --  11.7* 8.6  NEUTROABS 6.8  --   --   --   --   --   HGB 9.6*   < > 8.1* 8.8* 8.3* 6.8*  HCT 31.9*   < > 27.3* 26.0* 27.9* 21.3*  MCV 111.1*   < > 112.3*  --  113.0* 104.4*  PLT 155   < > 113*  --  121* 130*   < > = values in this interval not displayed.

## 2024-02-10 DIAGNOSIS — I5033 Acute on chronic diastolic (congestive) heart failure: Secondary | ICD-10-CM | POA: Diagnosis not present

## 2024-02-10 DIAGNOSIS — E43 Unspecified severe protein-calorie malnutrition: Secondary | ICD-10-CM | POA: Insufficient documentation

## 2024-02-10 DIAGNOSIS — I469 Cardiac arrest, cause unspecified: Secondary | ICD-10-CM | POA: Diagnosis not present

## 2024-02-10 DIAGNOSIS — I272 Pulmonary hypertension, unspecified: Secondary | ICD-10-CM | POA: Diagnosis not present

## 2024-02-10 DIAGNOSIS — R579 Shock, unspecified: Secondary | ICD-10-CM | POA: Diagnosis not present

## 2024-02-10 LAB — RENAL FUNCTION PANEL
Albumin: 1.9 g/dL — ABNORMAL LOW (ref 3.5–5.0)
Albumin: 2 g/dL — ABNORMAL LOW (ref 3.5–5.0)
Anion gap: 12 (ref 5–15)
Anion gap: 11 (ref 5–15)
BUN: 40 mg/dL — ABNORMAL HIGH (ref 8–23)
BUN: 34 mg/dL — ABNORMAL HIGH (ref 8–23)
CO2: 20 mmol/L — ABNORMAL LOW (ref 22–32)
CO2: 21 mmol/L — ABNORMAL LOW (ref 22–32)
Calcium: 7.6 mg/dL — ABNORMAL LOW (ref 8.9–10.3)
Calcium: 7.6 mg/dL — ABNORMAL LOW (ref 8.9–10.3)
Chloride: 106 mmol/L (ref 98–111)
Chloride: 105 mmol/L (ref 98–111)
Creatinine, Ser: 2.25 mg/dL — ABNORMAL HIGH (ref 0.61–1.24)
Creatinine, Ser: 1.71 mg/dL — ABNORMAL HIGH (ref 0.61–1.24)
GFR, Estimated: 29 mL/min — ABNORMAL LOW (ref >60)
GFR, Estimated: 41 mL/min — ABNORMAL LOW (ref >60)
Glucose, Bld: 169 mg/dL — ABNORMAL HIGH (ref 70–99)
Glucose, Bld: 188 mg/dL — ABNORMAL HIGH (ref 70–99)
Phosphorus: 1.8 mg/dL — ABNORMAL LOW (ref 2.5–4.6)
Phosphorus: 2.4 mg/dL — ABNORMAL LOW (ref 2.5–4.6)
Potassium: 3.6 mmol/L (ref 3.5–5.1)
Potassium: 4 mmol/L (ref 3.5–5.1)
Sodium: 138 mmol/L (ref 135–145)
Sodium: 137 mmol/L (ref 135–145)

## 2024-02-10 LAB — GLUCOSE, CAPILLARY
Glucose-Capillary: 147 mg/dL — ABNORMAL HIGH (ref 70–99)
Glucose-Capillary: 176 mg/dL — ABNORMAL HIGH (ref 70–99)
Glucose-Capillary: 202 mg/dL — ABNORMAL HIGH (ref 70–99)
Glucose-Capillary: 203 mg/dL — ABNORMAL HIGH (ref 70–99)
Glucose-Capillary: 150 mg/dL — ABNORMAL HIGH (ref 70–99)
Glucose-Capillary: 167 mg/dL — ABNORMAL HIGH (ref 70–99)

## 2024-02-10 LAB — BPAM RBC
Blood Product Expiration Date: 202510232359
ISSUE DATE / TIME: 202509230730
Unit Type and Rh: 5100

## 2024-02-10 LAB — TYPE AND SCREEN
ABO/RH(D): O POS
Antibody Screen: NEGATIVE
Unit division: 0

## 2024-02-10 LAB — MAGNESIUM: Magnesium: 2.5 mg/dL — ABNORMAL HIGH (ref 1.7–2.4)

## 2024-02-10 MED ORDER — FENTANYL CITRATE PF 50 MCG/ML IJ SOSY: 25.0000 ug | PREFILLED_SYRINGE | Freq: Once | INTRAMUSCULAR | Status: DC

## 2024-02-10 MED ORDER — INSULIN ASPART 100 UNIT/ML IJ SOLN
0.0000 [IU] | INTRAMUSCULAR | Status: DC
  Administered 2024-02-10: 3 [IU] via SUBCUTANEOUS
  Administered 2024-02-10 (×2): 5 [IU] via SUBCUTANEOUS
  Administered 2024-02-10: 2 [IU] via SUBCUTANEOUS
  Administered 2024-02-11: 3 [IU] via SUBCUTANEOUS
  Administered 2024-02-11 (×2): 2 [IU] via SUBCUTANEOUS
  Administered 2024-02-11 (×2): 3 [IU] via SUBCUTANEOUS
  Administered 2024-02-12: 5 [IU] via SUBCUTANEOUS
  Administered 2024-02-12 (×2): 3 [IU] via SUBCUTANEOUS
  Administered 2024-02-12: 5 [IU] via SUBCUTANEOUS

## 2024-02-10 MED ORDER — POTASSIUM PHOSPHATES 15 MMOLE/5ML IV SOLN
30.0000 mmol | Freq: Once | INTRAVENOUS | Status: AC
  Administered 2024-02-10: 30 mmol via INTRAVENOUS
  Filled 2024-02-10: qty 10

## 2024-02-10 MED ORDER — FENTANYL 2500MCG IN NS 250ML (10MCG/ML) PREMIX INFUSION
0.0000 ug/h | INTRAVENOUS | Status: DC
  Administered 2024-02-10: 150 ug/h via INTRAVENOUS
  Administered 2024-02-11 (×2): 100 ug/h via INTRAVENOUS
  Filled 2024-02-10 (×2): qty 250

## 2024-02-10 MED ORDER — FENTANYL BOLUS VIA INFUSION
25.0000 ug | INTRAVENOUS | Status: DC | PRN
  Administered 2024-02-10 (×2): 25 ug via INTRAVENOUS
  Administered 2024-02-11: 100 ug via INTRAVENOUS
  Administered 2024-02-11 (×4): 50 ug via INTRAVENOUS
  Administered 2024-02-11: 100 ug via INTRAVENOUS
  Administered 2024-02-11: 75 ug via INTRAVENOUS
  Administered 2024-02-11: 100 ug via INTRAVENOUS

## 2024-02-10 NOTE — Progress Notes (Signed)
 Admit: 02/03/2024 LOS: 7  54M with AKI on CKD4 vs progressive CKD; Cardiac arrest x2 02/08/24  Current CRRT Prescription: Start Date: 02/08/24 Catheter: L Femoral HD Temp Cath placed 9/22 by CCM BFR: 250 Pre Blood Pump: 1000 4K DFR: 1000 4K Replacement Rate: 400 4K Goal UF: Net even Anticoagulation: None Clotting: not since initiation  Subjective:  Seen on CRRT  K3.6, phosphorus 1.8, being repleted per pharmacy protocol Slightly net positive yesterday, target net even at the current time Off pressors Wife at bedside, updated Case discussed with primary RN and CCM MD  09/23 0701 - 09/24 0700 In: 1789.7 [I.V.:351.3; Blood:463.3; NG/GT:765; IV Piggyback:200.1] Out: 1271.3 [Urine:55; Emesis/NG output:35]  Filed Weights   02/08/24 0530 02/09/24 0438 02/10/24 0500  Weight: 93.8 kg 95.7 kg 91.3 kg    Scheduled Meds:  sodium chloride    Intravenous Once   aspirin   81 mg Per Tube Daily   atorvastatin   80 mg Per Tube QHS   Chlorhexidine  Gluconate Cloth  6 each Topical Daily   Chlorhexidine  Gluconate Cloth  6 each Topical Q0600   [START ON 02/11/2024] darbepoetin (ARANESP ) injection - DIALYSIS  60 mcg Subcutaneous Q Thu-1800   dicyclomine   10 mg Per Tube TID AC & HS   docusate  100 mg Oral BID   feeding supplement (PROSource TF20)  60 mL Per Tube BID   fentaNYL  (SUBLIMAZE ) injection  25-50 mcg Intravenous Once   heparin   5,000 Units Subcutaneous Q8H   insulin  aspart  0-15 Units Subcutaneous Q4H   latanoprost   1 drop Both Eyes QHS   lidocaine  (PF)  20 mL Infiltration Once   linezolid   600 mg Per Tube Q12H   multivitamin with minerals  1 tablet Per Tube Daily   nutrition supplement (JUVEN)  1 packet Per Tube BID BM   mouth rinse  15 mL Mouth Rinse Q2H   pantoprazole  (PROTONIX ) IV  40 mg Intravenous Daily   sertraline   25 mg Per Tube Daily   sodium chloride  flush  3 mL Intravenous Q12H   thiamine   100 mg Per Tube Daily   Continuous Infusions:  ampicillin -sulbactam (UNASYN ) IV  Stopped (02/10/24 0955)   feeding supplement (VITAL 1.5 CAL) 30 mL/hr at 02/10/24 1000   fentaNYL  infusion INTRAVENOUS 150 mcg/hr (02/10/24 1111)   potassium PHOSPHATE  IVPB (in mmol) 85 mL/hr at 02/10/24 1000   prismasol  BGK 4/2.5 400 mL/hr at 02/10/24 0859   prismasol  BGK 4/2.5 1,000 mL/hr at 02/10/24 1113   prismasol  BGK 4/2.5 1,000 mL/hr at 02/10/24 1113   PRN Meds:.acetaminophen  **OR** acetaminophen , bisacodyl , fentaNYL , heparin , nitroGLYCERIN , ondansetron  **OR** ondansetron  (ZOFRAN ) IV, mouth rinse, polyethylene glycol, sodium chloride , sodium chloride  flush  Current Labs: reviewed   Physical Exam:  Blood pressure 116/64, pulse (!) 55, temperature 97.9 F (36.6 C), resp. rate (!) 22, height 6' 2 (1.88 m), weight 91.3 kg, SpO2 100%. Intubated and sedated Regular: No rub, normal S1-S2 Coarse breath sounds bilaterally Soft, nontender Lower extremity wounds bandaged Left femoral temporary HD catheter bandaged, clean and intact  A AKI on CKD 4 versus progression of CKD, starting HD during admission on 9/22 PEA arrest 02/08/2024 x2 Urinary retention status post Foley and now on tamsulosin  Hypertension: Blood pressures have normalized after cardiac arrest Metabolic acidosis: Improved with dialysis Anemia, transfusion per CCM, on ESA Foot wounds and osteomyelitis of the fifth proximal phalanx of fifth metatarsal head on the right side on linezolid  and Unasyn  with orthopedics following Chronic DM2 Hypophosphatemia on repletion protocol  P Continue CRRT  but start fluid removal targeting net UF of 100 mL/h negative Tentative if hemodynamics are stable to transition over to intermittent hemodialysis the next 48 hours. Medication Issues; Preferred narcotic agents for pain control are hydromorphone , fentanyl , and methadone. Morphine  should not be used.  Baclofen should be avoided Avoid oral sodium phosphate  and magnesium  citrate based laxatives / bowel preps    Bernardino Gasman  MD 02/10/2024, 11:38 AM  Recent Labs  Lab 02/09/24 0437 02/09/24 1758 02/10/24 0309  NA 144 140 138  K 3.8 4.0 3.6  CL 112* 108 106  CO2 17* 19* 20*  GLUCOSE 103* 150* 169*  BUN 83* 51* 40*  CREATININE 4.23* 2.87* 2.25*  CALCIUM  8.3* 7.9* 7.6*  PHOS 2.7 2.2* 1.8*   Recent Labs  Lab 02/07/24 0943 02/08/24 1446 02/08/24 1513 02/09/24 0437 02/09/24 2003  WBC 9.4  --  11.7* 8.6  --   HGB 8.1*   < > 8.3* 6.8* 7.9*  HCT 27.3*   < > 27.9* 21.3* 23.8*  MCV 112.3*  --  113.0* 104.4*  --   PLT 113*  --  121* 130*  --    < > = values in this interval not displayed.

## 2024-02-10 NOTE — Progress Notes (Signed)
 Pharmacy Electrolyte Replacement  Recent Labs:  Recent Labs    02/10/24 0309  K 3.6  MG 2.5*  PHOS 1.8*  CREATININE 2.25*    Low Critical Values (K </= 2.5, Phos </= 1, Mg </= 1) Present: Phos = 1.8  MD Contacted: Foster  Plan: Replace Potassium Phospate 30 mmol IV x 1 per CRRT Phosphorus Pharmacy Replacement protocol.  R. Samual Satterfield, PharmD PGY-1 Acute Care Pharmacy Resident [facility] Please refer to [facility] for [facility] Pharmacy numbers 02/10/2024 7:34 AM

## 2024-02-10 NOTE — TOC Progression Note (Signed)
 Transition of Care [facility]) - Progression Note    Patient Details  Name: David [name] MRN: [id number] Date of Birth: 04/08/1946  Transition of Care [facility]) CM/SW Contact  Lauraine FORBES Saa, LCSWA Phone Number: 02/10/2024, 12:56 PM  Clinical Narrative:     12:56 PM Per progressions, patient is not yet medically ready for discharge. Patient remains intubated with feeding tube and continues to receive CRRT. CSW to submit patient's FL2 to [facility] upon therapy evaluations. TOC will continue to follow and be available to assist.  Expected Discharge Plan: Long Term Nursing Home Barriers to Discharge: Continued Medical Work up               Expected Discharge Plan and Services In-house Referral: Clinical Social Work   Post Acute Care Choice: Skilled Nursing Facility Living arrangements for the past 2 months: Skilled Nursing Facility                                       Social Drivers of Health (SDOH) Interventions SDOH Screenings   Food Insecurity: Patient Unable To Answer (02/04/2024)  Housing: Patient Unable To Answer (02/04/2024)  Transportation Needs: No Transportation Needs (01/02/2024)  Utilities: Not At Risk (01/02/2024)  Alcohol Screen: Low Risk  (05/18/2023)  Financial Resource Strain: Low Risk  (05/18/2023)  Physical Activity: Inactive (08/20/2017)  Social Connections: Socially Integrated (01/02/2024)  Stress: No Stress Concern Present (08/20/2017)  Tobacco Use: Medium Risk (02/08/2024)    Readmission Risk Interventions    11/09/2023   11:30 AM 11/06/2023    8:44 AM  Readmission Risk Prevention Plan  Transportation Screening Complete Complete  Medication Review (RN Care Manager) Complete Complete  PCP or Specialist appointment within 3-5 days of discharge Complete   HRI or Home Care Consult Complete Complete  SW Recovery Care/Counseling Consult Complete Complete  Palliative Care Screening Not Applicable Not Applicable  Skilled Nursing Facility Complete  Not Applicable

## 2024-02-10 NOTE — Progress Notes (Signed)
 PT Cancellation Note  Patient Details Name: David [name] MRN: [id number] DOB: [birth year]   Cancelled Treatment:    Reason Eval/Treat Not Completed: Medical issues which prohibited therapy; intubated and sedated with plans for CRRT.  PT will follow up another day.   [name] Portal 02/10/2024, 9:18 AM Micheline Portal, PT Acute Rehabilitation Services [phone] 02/10/2024

## 2024-02-10 NOTE — Progress Notes (Signed)
 NAME:  David [name], MRN:  [id number], DOB:  06/22/1945, LOS: 7 ADMISSION DATE:  02/03/2024, CONSULTATION DATE:  02/08/2024 REFERRING MD:  Dr Noralee, CHIEF COMPLAINT:  PEA Arrest   History of Present Illness:  David [name] is a 78 year old gentleman with PMH of chronic combined systolic and diastolic heart failure (EF 30-35%), essential HTN, chronic hypoxic respiratory failure (on baseline 3L O2), CKD stage IV, CAD s/p , T2DM, chronic diabetic foot ulcers in bilateral lower extremities who presented to the hospital from SNF on 9/17 for worsening dyspnea for the previous 2 weeks. In the ED a CXR demonstrated pulmonary edema, BNP was elevated to 704, Cr was elevated to 5.01. He was admitted to [facility] for heart failure exacerbation.    Nephrology was consulted for AKI on CKD versus progression of CKD and ultimately decision was made to pursue HD for volume management. He went to IR on 9/22 for dialysis catheter placement. During advancement of the guidewire in the right internal jugular he became bradycardic and subsequently became unresponsive found to be in PEA arrest. CPR was started and procedure was aborted. He was intubated during the arrest. After 3 minutes of CPR, epi, calcium , and sodium bicarbonate  pushes ROSC was achieved. He was transferred to [facility] ICU for further management.   Pertinent  Medical History   Past Medical History:  Diagnosis Date   Anemia    Arthritis    CAD in native artery    s/p stent in 11/18   CHF (congestive heart failure) (HCC)    normal echo in 11/18   CKD (chronic kidney disease) stage 3, GFR 30-59 ml/min (HCC)    DDD (degenerative disc disease), cervical    Diabetes mellitus with complication (HCC)    Type II   Diabetic neuropathy (HCC)    Diabetic retinopathy (HCC)    GERD (gastroesophageal reflux disease)    Glaucoma    Hypertension    Significant Hospital Events: Including procedures, antibiotic start and stop dates in addition to other pertinent events    9/17 Admitted to [facility] for acute on chronic CHF exacerbation and AKI on CKD stage IV 9/22 IR for dialysis catheter placement, complicated by bradycardia and subsequent PEA arrest while advancing guidewire. ROSC obtained after 3 minutes CPR. He was intubated during the arrest. 9/22 had a second PEA arrest 9/22 arterial line placed, HD catheter placed 9/24-remains on CRRT  Interim History / Subjective:  No overnight events 2 PEA arrest on 02/08/2024 Hemodynamic stable Patient responsive to verbal commands, moving upper extremities, appears weak in the lower extremities Eyes will open to name calling  Objective    Blood pressure 116/64, pulse (!) 55, temperature 97.9 F (36.6 C), resp. rate (!) 22, height 6' 2 (1.88 m), weight 91.3 kg, SpO2 100%.    Vent Mode: PRVC FiO2 (%):  [40 %] 40 % Set Rate:  [22 bmp] 22 bmp Vt Set:  [650 mL] 650 mL PEEP:  [5 cmH20-8 cmH20] 5 cmH20 Plateau Pressure:  [28 cmH20-33 cmH20] 28 cmH20   Intake/Output Summary (Last 24 hours) at 02/10/2024 9177 Last data filed at 02/10/2024 0700 Gross per 24 hour  Intake 1745.28 ml  Output 1228 ml  Net 517.28 ml   Filed Weights   02/08/24 0530 02/09/24 0438 02/10/24 0500  Weight: 93.8 kg 95.7 kg 91.3 kg    Examination: General: Chronically ill-appearing HENT: Pupils about 3 mm reactive just update okay Lungs: Bilateral rhonchi Cardiovascular: S1-S2 appreciated Abdomen: Soft, bowel sounds appreciated Extremities:  No clubbing, chronic skin changes Neuro: sedated GU:   I reviewed last 24 h vitals and pain scores, last 48 h intake and output, last 24 h labs and trends, and last 24 h imaging results. Resolved problem list   Assessment and Plan   S/p cardiac arrest, PEA x 2 - Successfully resuscitated - Neuroprotective measures- normothermia, euglycemia, HOB greater than 30, head in neutral alignment, normocapnia, normoxia  Mixed shock Background history of combined systolic and diastolic heart failure  with decompensation at presentation - Off pressors - Continue to maintain MAP greater than 65  Pulmonary hypertension - Secondary to group 2 disease  Atrial fibrillation - Continue monitoring - Replete electrolytes  Acute kidney injury - Maintain renal perfusion - Avoid nephrotoxic medications  Acute on chronic hypoxemic respiratory failure - Continue mechanical ventilation  -Target TVol 6-8cc/kgIBW -Target Plateau Pressure < 30cm H20 -Target driving pressure less than 15 cm of water  -Target PaO2 55-65: titrate PEEP/FiO2 per protocol -Ventilator associated pneumonia prevention protocol  Right foot osteomyelitis Progressive gangrenous changes on both feet Gangrene of the left heel - Continue on antibiotics linezolid  and Unasyn  at present - Surgery had recommended amputation prior to patient's cardiac arrest  Anemia of chronic disease - Continue to monitor - Received 1 unit PRBC on 01/09/2024  Type 2 diabetes - Continue SSI  Bladder prognosis  Updated spouse at bedside  Patient is DNR status  Labs   CBC: Recent Labs  Lab 02/03/24 1113 02/03/24 2013 02/05/24 0929 02/06/24 0832 02/07/24 0943 02/08/24 1446 02/08/24 1513 02/09/24 0437 02/09/24 2003  WBC 7.7   < > 10.9* 9.4 9.4  --  11.7* 8.6  --   NEUTROABS 6.8  --   --   --   --   --   --   --   --   HGB 9.6*   < > 9.4* 8.5* 8.1* 8.8* 8.3* 6.8* 7.9*  HCT 31.9*   < > 30.1* 27.5* 27.3* 26.0* 27.9* 21.3* 23.8*  MCV 111.1*   < > 108.7* 109.1* 112.3*  --  113.0* 104.4*  --   PLT 155   < > 128* 122* 113*  --  121* 130*  --    < > = values in this interval not displayed.    Basic Metabolic Panel: Recent Labs  Lab 02/07/24 0943 02/08/24 0338 02/08/24 1446 02/08/24 1513 02/09/24 0437 02/09/24 1758 02/10/24 0309  NA 145 146* 149* 147* 144 140 138  K 4.3 4.2 3.9 4.6 3.8 4.0 3.6  CL 114* 116*  --  117* 112* 108 106  CO2 15* 18*  --  18* 17* 19* 20*  GLUCOSE 126* 129*  --  139* 103* 150* 169*  BUN 122* 128*   --  122* 83* 51* 40*  CREATININE 5.69* 6.24*  --  6.21* 4.23* 2.87* 2.25*  CALCIUM  8.1* 8.2*  --  9.7 8.3* 7.9* 7.6*  MG  --   --   --  3.1* 2.9*  --  2.5*  PHOS 5.3* 5.7*  --   --  2.7 2.2* 1.8*   GFR: Estimated Creatinine Clearance: 32.5 mL/min (A) (by C-G formula based on SCr of 2.25 mg/dL (H)). Recent Labs  Lab 02/03/24 1720 02/03/24 2013 02/06/24 0832 02/07/24 0943 02/08/24 1513 02/09/24 0437  WBC  --    < > 9.4 9.4 11.7* 8.6  LATICACIDVEN 1.3  --   --   --  1.1  --    < > = values in this interval  not displayed.    Liver Function Tests: Recent Labs  Lab 02/04/24 0318 02/06/24 0832 02/08/24 0338 02/08/24 1513 02/09/24 0437 02/09/24 1758 02/10/24 0309  AST 32  --   --  31  --   --   --   ALT 18  --   --  19  --   --   --   ALKPHOS 140*  --   --  102  --   --   --   BILITOT 1.3*  --   --  1.2  --   --   --   PROT 7.7  --   --  7.7  --   --   --   ALBUMIN  2.7*   < > 2.2* 2.3* 2.0* 2.0* 1.9*   < > = values in this interval not displayed.   No results for input(s): LIPASE, AMYLASE in the last 168 hours. No results for input(s): AMMONIA in the last 168 hours.  ABG    Component Value Date/Time   PHART 7.352 02/08/2024 1446   PCO2ART 29.9 (L) 02/08/2024 1446   PO2ART 338 (H) 02/08/2024 1446   HCO3 17.0 (L) 02/08/2024 1446   TCO2 18 (L) 02/08/2024 1446   ACIDBASEDEF 8.0 (H) 02/08/2024 1446   O2SAT 100 02/08/2024 1446     Coagulation Profile: No results for input(s): INR, PROTIME in the last 168 hours.  Cardiac Enzymes: No results for input(s): CKTOTAL, CKMB, CKMBINDEX, TROPONINI in the last 168 hours.  HbA1C: Hgb A1c MFr Bld  Date/Time Value Ref Range Status  10/30/2023 02:08 AM 6.0 (H) 4.8 - 5.6 % Final    Comment:    (NOTE) Diagnosis of Diabetes The following HbA1c ranges recommended by the American Diabetes Association (ADA) may be used as an aid in the diagnosis of diabetes mellitus.  Hemoglobin             Suggested A1C NGSP%               Diagnosis  <5.7                   Non Diabetic  5.7-6.4                Pre-Diabetic  >6.4                   Diabetic  <7.0                   Glycemic control for                       adults with diabetes.    05/04/2023 11:05 PM 6.4 (H) 4.8 - 5.6 % Final    Comment:    (NOTE) Pre diabetes:          5.7%-6.4%  Diabetes:              >6.4%  Glycemic control for   <7.0% adults with diabetes     CBG: Recent Labs  Lab 02/09/24 1540 02/09/24 1919 02/09/24 2313 02/10/24 0311 02/10/24 0756  GLUCAP 116* 124* 131* 147* 176*    Review of Systems:   Unable to provide history  Past Medical History:  He,  has a past medical history of Anemia, Arthritis, CAD in native artery, CHF (congestive heart failure) (HCC), CKD (chronic kidney disease) stage 3, GFR 30-59 ml/min (HCC), DDD (degenerative disc disease), cervical, Diabetes mellitus with complication (HCC), Diabetic neuropathy (HCC), Diabetic retinopathy (HCC),  GERD (gastroesophageal reflux disease), Glaucoma, and Hypertension.   Surgical History:   Past Surgical History:  Procedure Laterality Date   AMPUTATION Right 07/09/2016   Procedure: Right Great Toe Amputation at Metatarsophalangeal Joint;  Surgeon: Jerona Harden GAILS, MD;  Location: [facility] OR;  Service: Orthopedics;  Laterality: Right;   AMPUTATION Right 05/28/2018   Procedure: RIGHT SECOND TOE AMPUTATION;  Surgeon: Harden Jerona GAILS, MD;  Location: [facility] OR;  Service: Orthopedics;  Laterality: Right;   AMPUTATION Left 08/03/2020   Procedure: LEFT 5TH RAY AMPUTATION;  Surgeon: Harden Jerona GAILS, MD;  Location: [facility] OR;  Service: Orthopedics;  Laterality: Left;   BACK SURGERY     4   CARDIAC CATHETERIZATION     CORONARY STENT INTERVENTION N/A 04/06/2017   Procedure: CORONARY STENT INTERVENTION;  Surgeon: Elmira Newman PARAS, MD;  Location: [facility] INVASIVE CV LAB;  Service: Cardiovascular;  Laterality: N/A;   CORONARY STENT INTERVENTION N/A 04/07/2017   Procedure: CORONARY STENT  INTERVENTION;  Surgeon: Elmira Newman PARAS, MD;  Location: [facility] INVASIVE CV LAB;  Service: Cardiovascular;  Laterality: N/A;   CYST EXCISION     on Back   ENDOTRACHEAL INTUBATION EMERGENT  08/07/2018       ESOPHAGOGASTRODUODENOSCOPY Left 11/07/2023   Procedure: EGD (ESOPHAGOGASTRODUODENOSCOPY);  Surgeon: Rosalie Kitchens, MD;  Location: [facility];  Service: Gastroenterology;  Laterality: Left;   IR TUNNELED CENTRAL VENOUS CATH PLC W IMG  02/08/2024   JOINT REPLACEMENT     LAPAROSCOPIC ABDOMINAL EXPLORATION N/A 11/26/2017   Procedure: LAPAROSCOPIC ABDOMINAL EXPLORATION, DRAINAGE OF APPENDICEAL ABCESS. PLACEMENT OF DRAIN;  Surgeon: Ethyl Lenis, MD;  Location: [facility];  Service: General;  Laterality: N/A;   RIGHT/LEFT HEART CATH AND CORONARY ANGIOGRAPHY N/A 04/06/2017   Procedure: RIGHT/LEFT HEART CATH AND CORONARY ANGIOGRAPHY;  Surgeon: Elmira Newman PARAS, MD;  Location: [facility] INVASIVE CV LAB;  Service: Cardiovascular;  Laterality: N/A;   TOE SURGERY  05/2018   Left    TOTAL HIP ARTHROPLASTY     Right      Social History:   reports that he quit smoking about 29 years ago. His smoking use included cigarettes. He started smoking about 34 years ago. He has a 1.3 pack-year smoking history. He has been exposed to tobacco smoke. He has never used smokeless tobacco. He reports that he does not currently use alcohol. He reports that he does not use drugs.   Family History:  His family history includes Alzheimer's disease in his father and another family member; Diabetes in an other family member; Diabetes Mellitus II in his father; Hyperlipidemia in an other family member; Hypertension in an other family member; Stroke in an other family member; Thyroid  disease in his mother.   Allergies Allergies  Allergen Reactions   Nsaids Other (See Comments)    CKD stage 3     The patient is critically ill with multiple organ systems failure and requires high complexity decision making for assessment and support,  frequent evaluation and titration of therapies, application of advanced monitoring technologies and extensive interpretation of multiple databases. Critical Care Time devoted to patient care services described in this note independent of APP/resident time (if applicable)  is 32 minutes.   Jennet Epley MD [facility] Personal pager: See Amion If unanswered, please page CCM On-call: #[phone]

## 2024-02-10 NOTE — Progress Notes (Signed)
 OT Cancellation Note  Patient Details Name: TREMON SAINVIL MRN: [id number] DOB: 1945-11-12   Cancelled Treatment:    Reason Eval/Treat Not Completed: Patient not medically ready. Pt on the ventilator, sedated with agitation, CRRT, Left fem HD cath. OT will continue to follow for eval.  Leita PARAS [facility] 02/10/2024, 10:31 AM  Leita DEL OTR/L Acute Rehabilitation Services Office: [phone]

## 2024-02-11 DIAGNOSIS — R579 Shock, unspecified: Secondary | ICD-10-CM | POA: Diagnosis not present

## 2024-02-11 DIAGNOSIS — I272 Pulmonary hypertension, unspecified: Secondary | ICD-10-CM | POA: Diagnosis not present

## 2024-02-11 DIAGNOSIS — I5033 Acute on chronic diastolic (congestive) heart failure: Secondary | ICD-10-CM | POA: Diagnosis not present

## 2024-02-11 DIAGNOSIS — I469 Cardiac arrest, cause unspecified: Secondary | ICD-10-CM | POA: Diagnosis not present

## 2024-02-11 LAB — GLUCOSE, CAPILLARY
Glucose-Capillary: 143 mg/dL — ABNORMAL HIGH (ref 70–99)
Glucose-Capillary: 173 mg/dL — ABNORMAL HIGH (ref 70–99)
Glucose-Capillary: 162 mg/dL — ABNORMAL HIGH (ref 70–99)
Glucose-Capillary: 132 mg/dL — ABNORMAL HIGH (ref 70–99)
Glucose-Capillary: 163 mg/dL — ABNORMAL HIGH (ref 70–99)

## 2024-02-11 LAB — RENAL FUNCTION PANEL
Albumin: 2 g/dL — ABNORMAL LOW (ref 3.5–5.0)
Albumin: 2 g/dL — ABNORMAL LOW (ref 3.5–5.0)
Anion gap: 11 (ref 5–15)
Anion gap: 12 (ref 5–15)
BUN: 30 mg/dL — ABNORMAL HIGH (ref 8–23)
BUN: 27 mg/dL — ABNORMAL HIGH (ref 8–23)
CO2: 21 mmol/L — ABNORMAL LOW (ref 22–32)
CO2: 21 mmol/L — ABNORMAL LOW (ref 22–32)
Calcium: 7.6 mg/dL — ABNORMAL LOW (ref 8.9–10.3)
Calcium: 7.5 mg/dL — ABNORMAL LOW (ref 8.9–10.3)
Chloride: 103 mmol/L (ref 98–111)
Chloride: 101 mmol/L (ref 98–111)
Creatinine, Ser: 1.47 mg/dL — ABNORMAL HIGH (ref 0.61–1.24)
Creatinine, Ser: 1.27 mg/dL — ABNORMAL HIGH (ref 0.61–1.24)
GFR, Estimated: 49 mL/min — ABNORMAL LOW (ref >60)
GFR, Estimated: 59 mL/min — ABNORMAL LOW (ref >60)
Glucose, Bld: 165 mg/dL — ABNORMAL HIGH (ref 70–99)
Glucose, Bld: 151 mg/dL — ABNORMAL HIGH (ref 70–99)
Phosphorus: 1.5 mg/dL — ABNORMAL LOW (ref 2.5–4.6)
Phosphorus: 3.2 mg/dL (ref 2.5–4.6)
Potassium: 4.2 mmol/L (ref 3.5–5.1)
Potassium: 4.2 mmol/L (ref 3.5–5.1)
Sodium: 135 mmol/L (ref 135–145)
Sodium: 134 mmol/L — ABNORMAL LOW (ref 135–145)

## 2024-02-11 LAB — CBC
HCT: 23.7 % — ABNORMAL LOW (ref 39.0–52.0)
Hemoglobin: 8 g/dL — ABNORMAL LOW (ref 13.0–17.0)
MCH: 32.5 pg (ref 26.0–34.0)
MCHC: 33.8 g/dL (ref 30.0–36.0)
MCV: 96.3 fL (ref 80.0–100.0)
Platelets: 86 K/uL — ABNORMAL LOW (ref 150–400)
RBC: 2.46 MIL/uL — ABNORMAL LOW (ref 4.22–5.81)
RDW: 26.7 % — ABNORMAL HIGH (ref 11.5–15.5)
WBC: 9.5 K/uL (ref 4.0–10.5)
nRBC: 0.5 % — ABNORMAL HIGH (ref 0.0–0.2)

## 2024-02-11 LAB — MAGNESIUM: Magnesium: 2.4 mg/dL (ref 1.7–2.4)

## 2024-02-11 MED ORDER — CLONAZEPAM 0.5 MG PO TABS
0.5000 mg | ORAL_TABLET | Freq: Every day | ORAL | Status: DC
  Administered 2024-02-11: 0.5 mg
  Filled 2024-02-11: qty 1

## 2024-02-11 MED ORDER — MIDAZOLAM HCL 2 MG/2ML IJ SOLN
2.0000 mg | INTRAMUSCULAR | Status: DC | PRN
  Administered 2024-02-11 – 2024-02-13 (×2): 2 mg via INTRAVENOUS
  Filled 2024-02-11 (×2): qty 2

## 2024-02-11 MED ORDER — SODIUM PHOSPHATES 45 MMOLE/15ML IV SOLN
30.0000 mmol | Freq: Once | INTRAVENOUS | Status: AC
  Administered 2024-02-11: 30 mmol via INTRAVENOUS
  Filled 2024-02-11: qty 10

## 2024-02-11 MED ORDER — QUETIAPINE FUMARATE 25 MG PO TABS
25.0000 mg | ORAL_TABLET | Freq: Every day | ORAL | Status: DC
  Administered 2024-02-11 – 2024-02-13 (×3): 25 mg
  Filled 2024-02-11 (×3): qty 1

## 2024-02-11 MED ORDER — TRAZODONE HCL 50 MG PO TABS
100.0000 mg | ORAL_TABLET | Freq: Every day | ORAL | Status: DC
  Administered 2024-02-11 – 2024-02-14 (×4): 100 mg
  Filled 2024-02-11 (×4): qty 2

## 2024-02-11 NOTE — Progress Notes (Signed)
 NAME:  David [name], MRN:  [id number], DOB:  [birth year], LOS: 8 ADMISSION DATE:  02/03/2024, CONSULTATION DATE:  02/08/2024 REFERRING MD:  Dr Noralee, CHIEF COMPLAINT:  PEA Arrest   History of Present Illness:  [name] is a 78 year old gentleman with PMH of chronic combined systolic and diastolic heart failure (EF 30-35%), essential HTN, chronic hypoxic respiratory failure (on baseline 3L O2), CKD stage IV, CAD s/p , T2DM, chronic diabetic foot ulcers in bilateral lower extremities who presented to the hospital from SNF on 9/17 for worsening dyspnea for the previous 2 weeks. In the ED a CXR demonstrated pulmonary edema, BNP was elevated to 704, Cr was elevated to 5.01. He was admitted to [facility] for heart failure exacerbation.    Nephrology was consulted for AKI on CKD versus progression of CKD and ultimately decision was made to pursue HD for volume management. He went to IR on 9/22 for dialysis catheter placement. During advancement of the guidewire in the right internal jugular he became bradycardic and subsequently became unresponsive found to be in PEA arrest. CPR was started and procedure was aborted. He was intubated during the arrest. After 3 minutes of CPR, epi, calcium , and sodium bicarbonate  pushes ROSC was achieved. He was transferred to [facility] ICU for further management.   Pertinent  Medical History   Past Medical History:  Diagnosis Date   Anemia    Arthritis    CAD in native artery    s/p stent in 11/18   CHF (congestive heart failure) (HCC)    normal echo in 11/18   CKD (chronic kidney disease) stage 3, GFR 30-59 ml/min (HCC)    DDD (degenerative disc disease), cervical    Diabetes mellitus with complication (HCC)    Type II   Diabetic neuropathy (HCC)    Diabetic retinopathy (HCC)    GERD (gastroesophageal reflux disease)    Glaucoma    Hypertension    Significant Hospital Events: Including procedures, antibiotic start and stop dates in addition to other pertinent events    9/17 Admitted to [facility] for acute on chronic CHF exacerbation and AKI on CKD stage IV 9/22 IR for dialysis catheter placement, complicated by bradycardia and subsequent PEA arrest while advancing guidewire. ROSC obtained after 3 minutes CPR. He was intubated during the arrest. 9/22 had a second PEA arrest 9/22 arterial line placed, HD catheter placed 9/24-remains on CRRT  Interim History / Subjective:  No overnight events 2 PEA arrest on 02/08/2024 Hemodynamics remained stable, responsive to verbal stimuli, not able to follow commands however was told that he was able to squeeze with his hands earlier  Objective    Blood pressure 105/71, pulse (!) 53, temperature 99 F (37.2 C), temperature source Esophageal, resp. rate (!) 22, height 6' 2 (1.88 m), weight 86.6 kg, SpO2 100%.    Vent Mode: PRVC FiO2 (%):  [40 %] 40 % Set Rate:  [22 bmp] 22 bmp Vt Set:  [650 mL] 650 mL PEEP:  [5 cmH20] 5 cmH20 Plateau Pressure:  [26 cmH20-28 cmH20] 26 cmH20   Intake/Output Summary (Last 24 hours) at 02/11/2024 0758 Last data filed at 02/11/2024 0700 Gross per 24 hour  Intake 2240.64 ml  Output 4530 ml  Net -2289.36 ml   Filed Weights   02/09/24 0438 02/10/24 0500 02/11/24 0403  Weight: 95.7 kg 91.3 kg 86.6 kg    Examination: General: Chronically ill-appearing HENT: Pupils about 3 mm reactive Lungs: Clear anteriorly, some rhonchi at the sides Cardiovascular: S1-S2  appreciated soft, bowel sounds appreciated Abdomen: Soft, bowel sounds appreciated Extremities: No clubbing, chronic skin changes Neuro: sedated GU:   I reviewed last 24 h vitals and pain scores, last 48 h intake and output, last 24 h labs and trends, and last 24 h imaging results.   Resolved problem list   Assessment and Plan   78 year old gentleman with systolic and diastolic heart failure, came in with decompensation, acute kidney injury Had a PEA arrest x 2-successfully resuscitated He is hemodynamically stable, waking  up but not following commands consistently  S/p cardiac arrest PEA x 2 - Continue neuroprotective measures  Mixed shock Decompensated heart failure, systolic and diastolic Off pressors - Maintain MAP greater than 65  Pulmonary hypertension - Secondary to group 2 disease  Atrial fibrillation - Continue monitoring - Replete electrolytes  Acute kidney injury - On CRRT - Discussed with renal - Will hold CRRT today  Acute hypoxemic respiratory failure - Continue mechanical ventilation  -Target TVol 6-8cc/kgIBW -Target Plateau Pressure < 30cm H20 -Target driving pressure less than 15 cm of water  -Target PaO2 55-65: titrate PEEP/FiO2 per protocol -Ventilator associated pneumonia prevention protocol  Right foot osteomyelitis - Gangrene of the heel - Continue antibiotics - Recommendation is for amputation - This plan is held at the present time pending stabilization  Anemia of chronic disease - Did receive 1 unit PRBC on 01/09/2024  Type 2 diabetes - Continue SSI  Prognosis is guarded Patient is DNR, continue current measures  I did update patient's daughter at bedside 01/10/2024  Labs   CBC: Recent Labs  Lab 02/06/24 0832 02/07/24 0943 02/08/24 1446 02/08/24 1513 02/09/24 0437 02/09/24 2003 02/11/24 0430  WBC 9.4 9.4  --  11.7* 8.6  --  9.5  HGB 8.5* 8.1* 8.8* 8.3* 6.8* 7.9* 8.0*  HCT 27.5* 27.3* 26.0* 27.9* 21.3* 23.8* 23.7*  MCV 109.1* 112.3*  --  113.0* 104.4*  --  96.3  PLT 122* 113*  --  121* 130*  --  86*    Basic Metabolic Panel: Recent Labs  Lab 02/08/24 1513 02/09/24 0437 02/09/24 1758 02/10/24 0309 02/10/24 1718 02/11/24 0430  NA 147* 144 140 138 137 135  K 4.6 3.8 4.0 3.6 4.0 4.2  CL 117* 112* 108 106 105 103  CO2 18* 17* 19* 20* 21* 21*  GLUCOSE 139* 103* 150* 169* 188* 165*  BUN 122* 83* 51* 40* 34* 30*  CREATININE 6.21* 4.23* 2.87* 2.25* 1.71* 1.47*  CALCIUM  9.7 8.3* 7.9* 7.6* 7.6* 7.6*  MG 3.1* 2.9*  --  2.5*  --  2.4  PHOS  --   2.7 2.2* 1.8* 2.4* 1.5*   GFR: Estimated Creatinine Clearance: 49.7 mL/min (A) (by C-G formula based on SCr of 1.47 mg/dL (H)). Recent Labs  Lab 02/07/24 0943 02/08/24 1513 02/09/24 0437 02/11/24 0430  WBC 9.4 11.7* 8.6 9.5  LATICACIDVEN  --  1.1  --   --     Liver Function Tests: Recent Labs  Lab 02/08/24 1513 02/09/24 0437 02/09/24 1758 02/10/24 0309 02/10/24 1718 02/11/24 0430  AST 31  --   --   --   --   --   ALT 19  --   --   --   --   --   ALKPHOS 102  --   --   --   --   --   BILITOT 1.2  --   --   --   --   --   PROT 7.7  --   --   --   --   --  ALBUMIN  2.3* 2.0* 2.0* 1.9* 2.0* 2.0*   No results for input(s): LIPASE, AMYLASE in the last 168 hours. No results for input(s): AMMONIA in the last 168 hours.  ABG    Component Value Date/Time   PHART 7.352 02/08/2024 1446   PCO2ART 29.9 (L) 02/08/2024 1446   PO2ART 338 (H) 02/08/2024 1446   HCO3 17.0 (L) 02/08/2024 1446   TCO2 18 (L) 02/08/2024 1446   ACIDBASEDEF 8.0 (H) 02/08/2024 1446   O2SAT 100 02/08/2024 1446     Coagulation Profile: No results for input(s): INR, PROTIME in the last 168 hours.  Cardiac Enzymes: No results for input(s): CKTOTAL, CKMB, CKMBINDEX, TROPONINI in the last 168 hours.  HbA1C: Hgb A1c MFr Bld  Date/Time Value Ref Range Status  10/30/2023 02:08 AM 6.0 (H) 4.8 - 5.6 % Final    Comment:    (NOTE) Diagnosis of Diabetes The following HbA1c ranges recommended by the American Diabetes Association (ADA) may be used as an aid in the diagnosis of diabetes mellitus.  Hemoglobin             Suggested A1C NGSP%              Diagnosis  <5.7                   Non Diabetic  5.7-6.4                Pre-Diabetic  >6.4                   Diabetic  <7.0                   Glycemic control for                       adults with diabetes.    05/04/2023 11:05 PM 6.4 (H) 4.8 - 5.6 % Final    Comment:    (NOTE) Pre diabetes:          5.7%-6.4%  Diabetes:               >6.4%  Glycemic control for   <7.0% adults with diabetes     CBG: Recent Labs  Lab 02/10/24 1519 02/10/24 1938 02/10/24 2300 02/11/24 0315 02/11/24 0744  GLUCAP 203* 150* 167* 143* 173*    Review of Systems:   Unable to provide history  Past Medical History:  He,  has a past medical history of Anemia, Arthritis, CAD in native artery, CHF (congestive heart failure) (HCC), CKD (chronic kidney disease) stage 3, GFR 30-59 ml/min (HCC), DDD (degenerative disc disease), cervical, Diabetes mellitus with complication (HCC), Diabetic neuropathy (HCC), Diabetic retinopathy (HCC), GERD (gastroesophageal reflux disease), Glaucoma, and Hypertension.   Surgical History:   Past Surgical History:  Procedure Laterality Date   AMPUTATION Right 07/09/2016   Procedure: Right Great Toe Amputation at Metatarsophalangeal Joint;  Surgeon: Jerona Harden GAILS, MD;  Location: [facility] OR;  Service: Orthopedics;  Laterality: Right;   AMPUTATION Right 05/28/2018   Procedure: RIGHT SECOND TOE AMPUTATION;  Surgeon: Harden Jerona GAILS, MD;  Location[facility] OR;  Service: Orthopedics;  Laterality: Right;   AMPUTATION Left 08/03/2020   Procedure: LEFT 5TH RAY AMPUTATION;  Surgeon: Harden Jerona GAILS, MD;  Location: [facility] OR;  Service: Orthopedics;  Laterality: Left;   BACK SURGERY     4   CARDIAC CATHETERIZATION     CORONARY STENT INTERVENTION N/A 04/06/2017   Procedure: CORONARY STENT INTERVENTION;  Surgeon:  Patwardhan, Newman PARAS, MD;  Location: [facility] INVASIVE CV LAB;  Service: Cardiovascular;  Laterality: N/A;   CORONARY STENT INTERVENTION N/A 04/07/2017   Procedure: CORONARY STENT INTERVENTION;  Surgeon: Elmira Newman PARAS, MD;  Location: [facility] INVASIVE CV LAB;  Service: Cardiovascular;  Laterality: N/A;   CYST EXCISION     on Back   ENDOTRACHEAL INTUBATION EMERGENT  08/07/2018       ESOPHAGOGASTRODUODENOSCOPY Left 11/07/2023   Procedure: EGD (ESOPHAGOGASTRODUODENOSCOPY);  Surgeon: Rosalie Kitchens, MD;  Location: [facility];  Service:  Gastroenterology;  Laterality: Left;   IR TUNNELED CENTRAL VENOUS CATH PLC W IMG  02/08/2024   JOINT REPLACEMENT     LAPAROSCOPIC ABDOMINAL EXPLORATION N/A 11/26/2017   Procedure: LAPAROSCOPIC ABDOMINAL EXPLORATION, DRAINAGE OF APPENDICEAL ABCESS. PLACEMENT OF DRAIN;  Surgeon: Ethyl Lenis, MD;  Location: [facility] OR;  Service: General;  Laterality: N/A;   RIGHT/LEFT HEART CATH AND CORONARY ANGIOGRAPHY N/A 04/06/2017   Procedure: RIGHT/LEFT HEART CATH AND CORONARY ANGIOGRAPHY;  Surgeon: Elmira Newman PARAS, MD;  Location: [facility] INVASIVE CV LAB;  Service: Cardiovascular;  Laterality: N/A;   TOE SURGERY  05/2018   Left    TOTAL HIP ARTHROPLASTY     Right      Social History:   reports that he quit smoking about 29 years ago. His smoking use included cigarettes. He started smoking about 34 years ago. He has a 1.3 pack-year smoking history. He has been exposed to tobacco smoke. He has never used smokeless tobacco. He reports that he does not currently use alcohol. He reports that he does not use drugs.   Family History:  His family history includes Alzheimer's disease in his father and another family member; Diabetes in an other family member; Diabetes Mellitus II in his father; Hyperlipidemia in an other family member; Hypertension in an other family member; Stroke in an other family member; Thyroid  disease in his mother.   Allergies Allergies  Allergen Reactions   Nsaids Other (See Comments)    CKD stage 3     The patient is critically ill with multiple organ systems failure and requires high complexity decision making for assessment and support, frequent evaluation and titration of therapies, application of advanced monitoring technologies and extensive interpretation of multiple databases. Critical Care Time devoted to patient care services described in this note independent of APP/resident time (if applicable)  is 32 minutes.   Jennet Epley MD [place] [facility] Critical Care Personal pager:  See Amion If unanswered, please page CCM On-call: [phone]

## 2024-02-11 NOTE — Progress Notes (Signed)
 PT Cancellation Note  Patient Details Name: PRECIOUS GILCHREST MRN: [id number] DOB: December 24, 1945   Cancelled Treatment:    Reason Eval/Treat Not Completed: (P) Medical issues which prohibited therapy Per notes team is potentially stopping CRRT today, Pt remains vented with overnight issues of sedation and agitation. PT will follow back for appropriateness tomorrow.  Ermie Glendenning B. Fleeta Lapidus PT, DPT Acute Rehabilitation Services Please use secure chat or  Call Office [phone]    Almarie KATHEE Fleeta [facility] 02/11/2024, 9:44 AM

## 2024-02-11 NOTE — Progress Notes (Signed)
 Admit: 02/03/2024 LOS: 8  80M with AKI on CKD4 vs progressive CKD; Cardiac arrest x2 02/08/24  Current CRRT Prescription: Start Date: 02/08/24 Catheter: L Femoral HD Temp Cath placed 9/22 by CCM BFR: 250 Pre Blood Pump: 1000 4K DFR: 1000 4K Replacement Rate: 400 4K Goal UF: 100 mL/h net negative Anticoagulation: None Clotting: not since initiation  Subjective:  Seen on CRRT K4.2, phosphorus 1.5 receiving phosphorus repletion Net -2.2 L yesterday, anuric No pressors, blood pressures are fairly stable   09/24 0701 - 09/25 0700 In: 2240.6 [P.O.:175; I.V.:268; NG/GT:1077.5; IV Piggyback:710.2] Out: 4530 [Urine:20]  Filed Weights   02/09/24 0438 02/10/24 0500 02/11/24 0403  Weight: 95.7 kg 91.3 kg 86.6 kg    Scheduled Meds:  sodium chloride    Intravenous Once   aspirin   81 mg Per Tube Daily   atorvastatin   80 mg Per Tube QHS   Chlorhexidine  Gluconate Cloth  6 each Topical Daily   Chlorhexidine  Gluconate Cloth  6 each Topical Q0600   clonazePAM   0.5 mg Per Tube QHS   darbepoetin (ARANESP ) injection - DIALYSIS  60 mcg Subcutaneous Q Thu-1800   dicyclomine   10 mg Per Tube TID AC & HS   docusate  100 mg Oral BID   feeding supplement (PROSource TF20)  60 mL Per Tube BID   fentaNYL  (SUBLIMAZE ) injection  25-50 mcg Intravenous Once   heparin   5,000 Units Subcutaneous Q8H   insulin  aspart  0-15 Units Subcutaneous Q4H   latanoprost   1 drop Both Eyes QHS   lidocaine  (PF)  20 mL Infiltration Once   linezolid   600 mg Per Tube Q12H   multivitamin with minerals  1 tablet Per Tube Daily   nutrition supplement (JUVEN)  1 packet Per Tube BID BM   mouth rinse  15 mL Mouth Rinse Q2H   pantoprazole  (PROTONIX ) IV  40 mg Intravenous Daily   QUEtiapine   25 mg Per Tube QHS   sertraline   25 mg Per Tube Daily   sodium chloride  flush  3 mL Intravenous Q12H   thiamine   100 mg Per Tube Daily   traZODone   100 mg Per Tube QHS   Continuous Infusions:  ampicillin -sulbactam (UNASYN ) IV 3 g (02/11/24  1022)   feeding supplement (VITAL 1.5 CAL) 50 mL/hr at 02/11/24 1000   fentaNYL  infusion INTRAVENOUS 75 mcg/hr (02/11/24 1000)   prismasol  BGK 4/2.5 400 mL/hr at 02/11/24 1024   prismasol  BGK 4/2.5 1,000 mL/hr at 02/11/24 0700   prismasol  BGK 4/2.5 1,000 mL/hr at 02/11/24 0701   PRN Meds:.acetaminophen  **OR** acetaminophen , bisacodyl , fentaNYL , heparin , midazolam , nitroGLYCERIN , ondansetron  **OR** ondansetron  (ZOFRAN ) IV, mouth rinse, polyethylene glycol, sodium chloride , sodium chloride  flush  Current Labs: reviewed   Physical Exam:  Blood pressure 114/66, pulse (!) 56, temperature 99 F (37.2 C), resp. rate 20, height 6' 2 (1.88 m), weight 86.6 kg, SpO2 100%. Intubated and sedated Regular: No rub, normal S1-S2 Coarse breath sounds bilaterally Soft, nontender Lower extremity wounds bandaged Left femoral temporary HD catheter bandaged, clean and intact  A AKI on CKD 4 versus progression of CKD, starting HD during admission on 9/22 PEA arrest 02/08/2024 x2 Urinary retention status post Foley and now on tamsulosin  Hypertension: Blood pressures have normalized after cardiac arrest Metabolic acidosis: Improved with dialysis Anemia, transfusion per CCM, on ESA Foot wounds and osteomyelitis of the fifth proximal phalanx of fifth metatarsal head on the right side on linezolid  and Unasyn  with orthopedics following Chronic DM2 Hypophosphatemia on repletion protocol  P Plan to continue CRRT  for fluid removal with discontinuation by the end of day shift today Likely stable for transition to intermittent hemodialysis starting 9/26 or 9/27 Medication Issues; Preferred narcotic agents for pain control are hydromorphone , fentanyl , and methadone. Morphine  should not be used.  Baclofen should be avoided Avoid oral sodium phosphate  and magnesium  citrate based laxatives / bowel preps   Plan discussed with Dr. Neda and primary RN   Bernardino Gasman MD 02/11/2024, 10:56 AM  Recent Labs  Lab  02/10/24 0309 02/10/24 1718 02/11/24 0430  NA 138 137 135  K 3.6 4.0 4.2  CL 106 105 103  CO2 20* 21* 21*  GLUCOSE 169* 188* 165*  BUN 40* 34* 30*  CREATININE 2.25* 1.71* 1.47*  CALCIUM  7.6* 7.6* 7.6*  PHOS 1.8* 2.4* 1.5*   Recent Labs  Lab 02/08/24 1513 02/09/24 0437 02/09/24 2003 02/11/24 0430  WBC 11.7* 8.6  --  9.5  HGB 8.3* 6.8* 7.9* 8.0*  HCT 27.9* 21.3* 23.8* 23.7*  MCV 113.0* 104.4*  --  96.3  PLT 121* 130*  --  86*

## 2024-02-11 NOTE — Progress Notes (Signed)
 OT Cancellation Note  Patient Details Name: David [name] MRN: [id number] DOB: 1945/07/12   Cancelled Treatment:    Reason Eval/Treat Not Completed: Patient not medically ready. OT will continue to follow acutely. Saw note that team is potentially stopping CRRT today, Pt remains vented with overnight issues of sedation and agitation.   Leita PARAS Tniyah Nakagawa 02/11/2024, 8:42 AM  Leita DEL OTR/L Acute Rehabilitation Services Office: [phone]

## 2024-02-11 NOTE — TOC Progression Note (Signed)
 Transition of Care [facility]) - Progression Note    Patient Details  Name: David [name] MRN: [id number] Date of Birth: [birth year]  [facility] CM/SW Contact  Lauraine FORBES Saa, LCSWA Phone Number: 02/11/2024, 3:43 PM  Clinical Narrative:     3:43 PM Per progressions, patient continues to receive CRRT and remains intubated with NG/OG. CSW to send FL2 to [facility] SNF LTC once patient has been extubated and is able to be evaluated by PT/OT. CSW will continue to follow and be available to assist.  Expected Discharge Plan: Long Term Nursing Home Barriers to Discharge: Continued Medical Work up               Expected Discharge Plan and Services In-house Referral: Clinical Social Work   Post Acute Care Choice: Skilled Nursing Facility Living arrangements for the past 2 months: Skilled Nursing Facility                                       Social Drivers of Health (SDOH) Interventions SDOH Screenings   Food Insecurity: Patient Unable To Answer (02/04/2024)  Housing: Patient Unable To Answer (02/04/2024)  Transportation Needs: No Transportation Needs (01/02/2024)  Utilities: Not At Risk (01/02/2024)  Alcohol Screen: Low Risk  (05/18/2023)  Financial Resource Strain: Low Risk  (05/18/2023)  Physical Activity: Inactive (08/20/2017)  Social Connections: Socially Integrated (01/02/2024)  Stress: No Stress Concern Present (08/20/2017)  Tobacco Use: Medium Risk (02/08/2024)    Readmission Risk Interventions    11/09/2023   11:30 AM 11/06/2023    8:44 AM  Readmission Risk Prevention Plan  Transportation Screening Complete Complete  Medication Review (RN Care Manager) Complete Complete  PCP or Specialist appointment within 3-5 days of discharge Complete   HRI or Home Care Consult Complete Complete  SW Recovery Care/Counseling Consult Complete Complete  Palliative Care Screening Not Applicable Not Applicable  Skilled Nursing Facility Complete Not Applicable

## 2024-02-11 NOTE — Progress Notes (Signed)
 Pharmacy Electrolyte Replacement  Recent Labs:  Recent Labs    02/11/24 0430  K 4.2  MG 2.4  PHOS 1.5*  CREATININE 1.47*    Low Critical Values (K </= 2.5, Phos </= 1, Mg </= 1) Present: Phos = 1.5  MD Contacted: Discussed with Dr. Marlee - possibly stopping CRRT today so he requested lower dose of 30 mmol vs protocol recommendation of 45 mmol.   Plan: Replace Sodium Phospate 30 mol IV x 1 per CRRT Phosphorus Pharmacy Replacement protocol.  Harlene Boga, PharmD, BCPS, BCCCP Clinical Pharmacist Please refer to [facility] for Acute Care [facility] Pharmacy numbers 02/11/2024 7:25 AM

## 2024-02-11 NOTE — Progress Notes (Signed)
 eLink Physician-Brief Progress Note Patient Name: David [name] DOB: [birth year] MRN: [id number]   Date of Service  02/11/2024  HPI/Events of Note  Patient with sub-optimal sedation on the ventilator, he is agitated and biting on the ET Tube.  eICU Interventions  PRN Versed  added to Fentanyl  sedation regimen.        Nailea Whitehorn U Ashleynicole Mcclees 02/11/2024, 12:55 AM

## 2024-02-11 NOTE — Plan of Care (Signed)
  Problem: Clinical Measurements: Goal: Ability to maintain clinical measurements within normal limits will improve Outcome: Progressing   Problem: Nutrition: Goal: Adequate nutrition will be maintained Outcome: Progressing   Problem: Coping: Goal: Level of anxiety will decrease Outcome: Progressing   Problem: Safety: Goal: Ability to remain free from injury will improve Outcome: Progressing   Problem: Skin Integrity: Goal: Risk for impaired skin integrity will decrease Outcome: Progressing   Problem: Cardiac: Goal: Ability to achieve and maintain adequate cardiopulmonary perfusion will improve Outcome: Progressing   Problem: Education: Goal: Individualized Educational Video(s) Outcome: Progressing   Problem: Safety: Goal: Non-violent Restraint(s) Outcome: Progressing   Problem: Respiratory: Goal: Ability to maintain a clear airway and adequate ventilation will improve Outcome: Progressing

## 2024-02-12 ENCOUNTER — Inpatient Hospital Stay (HOSPITAL_COMMUNITY)

## 2024-02-12 DIAGNOSIS — I469 Cardiac arrest, cause unspecified: Secondary | ICD-10-CM | POA: Diagnosis not present

## 2024-02-12 DIAGNOSIS — N179 Acute kidney failure, unspecified: Secondary | ICD-10-CM | POA: Diagnosis not present

## 2024-02-12 DIAGNOSIS — J9601 Acute respiratory failure with hypoxia: Secondary | ICD-10-CM

## 2024-02-12 DIAGNOSIS — I5033 Acute on chronic diastolic (congestive) heart failure: Secondary | ICD-10-CM | POA: Diagnosis not present

## 2024-02-12 LAB — RENAL FUNCTION PANEL
Albumin: 1.9 g/dL — ABNORMAL LOW (ref 3.5–5.0)
Albumin: 2 g/dL — ABNORMAL LOW (ref 3.5–5.0)
Anion gap: 12 (ref 5–15)
Anion gap: 13 (ref 5–15)
BUN: 40 mg/dL — ABNORMAL HIGH (ref 8–23)
BUN: 49 mg/dL — ABNORMAL HIGH (ref 8–23)
CO2: 21 mmol/L — ABNORMAL LOW (ref 22–32)
CO2: 20 mmol/L — ABNORMAL LOW (ref 22–32)
Calcium: 7.4 mg/dL — ABNORMAL LOW (ref 8.9–10.3)
Calcium: 7.3 mg/dL — ABNORMAL LOW (ref 8.9–10.3)
Chloride: 102 mmol/L (ref 98–111)
Chloride: 104 mmol/L (ref 98–111)
Creatinine, Ser: 1.8 mg/dL — ABNORMAL HIGH (ref 0.61–1.24)
Creatinine, Ser: 2.11 mg/dL — ABNORMAL HIGH (ref 0.61–1.24)
GFR, Estimated: 39 mL/min — ABNORMAL LOW (ref >60)
GFR, Estimated: 32 mL/min — ABNORMAL LOW (ref >60)
Glucose, Bld: 257 mg/dL — ABNORMAL HIGH (ref 70–99)
Glucose, Bld: 300 mg/dL — ABNORMAL HIGH (ref 70–99)
Phosphorus: 2.2 mg/dL — ABNORMAL LOW (ref 2.5–4.6)
Phosphorus: 2.2 mg/dL — ABNORMAL LOW (ref 2.5–4.6)
Potassium: 4.3 mmol/L (ref 3.5–5.1)
Potassium: 4.5 mmol/L (ref 3.5–5.1)
Sodium: 135 mmol/L (ref 135–145)
Sodium: 137 mmol/L (ref 135–145)

## 2024-02-12 LAB — GLUCOSE, CAPILLARY
Glucose-Capillary: 173 mg/dL — ABNORMAL HIGH (ref 70–99)
Glucose-Capillary: 240 mg/dL — ABNORMAL HIGH (ref 70–99)
Glucose-Capillary: 238 mg/dL — ABNORMAL HIGH (ref 70–99)
Glucose-Capillary: 159 mg/dL — ABNORMAL HIGH (ref 70–99)
Glucose-Capillary: 234 mg/dL — ABNORMAL HIGH (ref 70–99)
Glucose-Capillary: 250 mg/dL — ABNORMAL HIGH (ref 70–99)
Glucose-Capillary: 158 mg/dL — ABNORMAL HIGH (ref 70–99)

## 2024-02-12 LAB — COMPREHENSIVE METABOLIC PANEL WITH GFR
ALT: 39 U/L (ref 0–44)
AST: 112 U/L — ABNORMAL HIGH (ref 15–41)
Albumin: 2 g/dL — ABNORMAL LOW (ref 3.5–5.0)
Alkaline Phosphatase: 260 U/L — ABNORMAL HIGH (ref 38–126)
Anion gap: 14 (ref 5–15)
BUN: 52 mg/dL — ABNORMAL HIGH (ref 8–23)
CO2: 22 mmol/L (ref 22–32)
Calcium: 7.7 mg/dL — ABNORMAL LOW (ref 8.9–10.3)
Chloride: 102 mmol/L (ref 98–111)
Creatinine, Ser: 2.18 mg/dL — ABNORMAL HIGH (ref 0.61–1.24)
GFR, Estimated: 31 mL/min — ABNORMAL LOW (ref >60)
Glucose, Bld: 246 mg/dL — ABNORMAL HIGH (ref 70–99)
Potassium: 4 mmol/L (ref 3.5–5.1)
Sodium: 138 mmol/L (ref 135–145)
Total Bilirubin: 0.8 mg/dL (ref 0.0–1.2)
Total Protein: 7.4 g/dL (ref 6.5–8.1)

## 2024-02-12 LAB — POCT I-STAT 7, (LYTES, BLD GAS, ICA,H+H)
Acid-Base Excess: 1 mmol/L (ref 0.0–2.0)
Bicarbonate: 24.3 mmol/L (ref 20.0–28.0)
Calcium, Ion: 1.04 mmol/L — ABNORMAL LOW (ref 1.15–1.40)
HCT: 29 % — ABNORMAL LOW (ref 39.0–52.0)
Hemoglobin: 9.9 g/dL — ABNORMAL LOW (ref 13.0–17.0)
O2 Saturation: 100 %
Patient temperature: 99
Potassium: 4.3 mmol/L (ref 3.5–5.1)
Sodium: 139 mmol/L (ref 135–145)
TCO2: 25 mmol/L (ref 22–32)
pCO2 arterial: 32.6 mmHg (ref 32–48)
pH, Arterial: 7.483 — ABNORMAL HIGH (ref 7.35–7.45)
pO2, Arterial: 310 mmHg — ABNORMAL HIGH (ref 83–108)

## 2024-02-12 LAB — CBC
HCT: 28.1 % — ABNORMAL LOW (ref 39.0–52.0)
Hemoglobin: 9.2 g/dL — ABNORMAL LOW (ref 13.0–17.0)
MCH: 32.5 pg (ref 26.0–34.0)
MCHC: 32.7 g/dL (ref 30.0–36.0)
MCV: 99.3 fL (ref 80.0–100.0)
Platelets: 89 K/uL — ABNORMAL LOW (ref 150–400)
RBC: 2.83 MIL/uL — ABNORMAL LOW (ref 4.22–5.81)
RDW: 26.4 % — ABNORMAL HIGH (ref 11.5–15.5)
WBC: 14.3 K/uL — ABNORMAL HIGH (ref 4.0–10.5)
nRBC: 0 % (ref 0.0–0.2)

## 2024-02-12 LAB — TROPONIN I (HIGH SENSITIVITY)
Troponin I (High Sensitivity): 104 ng/L (ref <18)
Troponin I (High Sensitivity): 102 ng/L (ref <18)
Troponin I (High Sensitivity): 101 ng/L (ref <18)

## 2024-02-12 LAB — PHOSPHORUS: Phosphorus: 2.3 mg/dL — ABNORMAL LOW (ref 2.5–4.6)

## 2024-02-12 LAB — TRIGLYCERIDES: Triglycerides: 58 mg/dL (ref <150)

## 2024-02-12 LAB — MAGNESIUM
Magnesium: 2.5 mg/dL — ABNORMAL HIGH (ref 1.7–2.4)
Magnesium: 2.5 mg/dL — ABNORMAL HIGH (ref 1.7–2.4)

## 2024-02-12 LAB — LACTIC ACID, PLASMA: Lactic Acid, Venous: 1.7 mmol/L (ref 0.5–1.9)

## 2024-02-12 MED ORDER — INSULIN ASPART 100 UNIT/ML IJ SOLN
0.0000 [IU] | INTRAMUSCULAR | Status: DC
  Administered 2024-02-12 (×2): 7 [IU] via SUBCUTANEOUS
  Administered 2024-02-13: 3 [IU] via SUBCUTANEOUS
  Administered 2024-02-13: 4 [IU] via SUBCUTANEOUS
  Administered 2024-02-14 – 2024-02-16 (×8): 3 [IU] via SUBCUTANEOUS
  Administered 2024-02-17 (×4): 4 [IU] via SUBCUTANEOUS

## 2024-02-12 MED ORDER — SODIUM PHOSPHATES 45 MMOLE/15ML IV SOLN
10.0000 mmol | Freq: Once | INTRAVENOUS | Status: AC
  Administered 2024-02-12: 10 mmol via INTRAVENOUS
  Filled 2024-02-12: qty 3.33

## 2024-02-12 MED ORDER — PROPOFOL 1000 MG/100ML IV EMUL
0.0000 ug/kg/min | INTRAVENOUS | Status: DC
  Administered 2024-02-12: 5 ug/kg/min via INTRAVENOUS
  Filled 2024-02-12: qty 100

## 2024-02-12 MED ORDER — DEXMEDETOMIDINE HCL IN NACL 400 MCG/100ML IV SOLN
0.0000 ug/kg/h | INTRAVENOUS | Status: DC
  Administered 2024-02-12: 0.4 ug/kg/h via INTRAVENOUS
  Filled 2024-02-12: qty 100

## 2024-02-12 MED ORDER — CHLORHEXIDINE GLUCONATE CLOTH 2 % EX PADS
6.0000 | MEDICATED_PAD | Freq: Every day | CUTANEOUS | Status: DC
Start: 2024-02-13 — End: 2024-02-16
  Administered 2024-02-16: 6 via TOPICAL

## 2024-02-12 MED ORDER — CALCIUM GLUCONATE-NACL 1-0.675 GM/50ML-% IV SOLN
1.0000 g | Freq: Once | INTRAVENOUS | Status: AC
  Administered 2024-02-12: 1000 mg via INTRAVENOUS
  Filled 2024-02-12: qty 50

## 2024-02-12 MED ORDER — DOCUSATE SODIUM 50 MG/5ML PO LIQD
100.0000 mg | Freq: Two times a day (BID) | ORAL | Status: DC
  Administered 2024-02-13 – 2024-02-14 (×3): 100 mg
  Filled 2024-02-12 (×3): qty 10

## 2024-02-12 MED ORDER — ATROPINE SULFATE 1 MG/10ML IJ SOSY
PREFILLED_SYRINGE | INTRAMUSCULAR | Status: AC
  Filled 2024-02-12: qty 10

## 2024-02-12 MED ORDER — FENTANYL CITRATE PF 50 MCG/ML IJ SOSY
25.0000 ug | PREFILLED_SYRINGE | INTRAMUSCULAR | Status: DC | PRN
  Administered 2024-02-12 – 2024-02-13 (×2): 50 ug via INTRAVENOUS
  Administered 2024-02-13: 100 ug via INTRAVENOUS
  Administered 2024-02-14: 50 ug via INTRAVENOUS
  Filled 2024-02-12 (×2): qty 1
  Filled 2024-02-12: qty 2
  Filled 2024-02-12: qty 1

## 2024-02-12 NOTE — Progress Notes (Signed)
 eLink Physician-Brief Progress Note Patient Name: David [name] DOB: 09-03-1945 MRN: [id number]   Date of Service  02/12/2024  HPI/Events of Note  One hour Troponin is flat (102)  eICU Interventions  Continue daily aspirin , no other intervention indicated at this time.        Aviella Disbrow U Tennile Styles 02/12/2024, 11:36 PM

## 2024-02-12 NOTE — Procedures (Signed)
 Cortrak  Person Inserting Tube:  David [name], RD Tube Type:  Cortrak - 43 inches Tube Size:  10 Tube Location:  Right nare Secured by: Bridle Initial Placement:  Gastric Cortrak Secured At:  79 cm Initial Placement Verification:  Cortrak device (Registered Dieticians Only) Procedure Comments:  Cortrak Tube Team Note:  Consult received to place a Cortrak feeding tube.   No x-ray is required. RN may begin using tube.   If the tube becomes dislodged please keep the tube and contact the Cortrak team at [email or web address] for replacement.  If after hours and replacement cannot be delayed, place a NG tube and confirm placement with an abdominal x-ray.   [name] Donal MS, RDN, LDN, CNSC Registered Dietitian 3 Clinical Nutrition RD Inpatient Contact Info in Amion

## 2024-02-12 NOTE — Inpatient Diabetes Management (Signed)
 Inpatient Diabetes Program Recommendations  AACE/ADA: New Consensus Statement on Inpatient Glycemic Control (2015)  Target Ranges:  Prepandial:   less than 140 mg/dL      Peak postprandial:   less than 180 mg/dL (1-2 hours)      Critically ill patients:  140 - 180 mg/dL   Lab Results  Component Value Date   GLUCAP 238 (H) 02/12/2024   HGBA1C 6.0 (H) 10/30/2023    Review of Glycemic Control  Latest Reference Range & Units 02/11/24 23:40 02/12/24 03:20 02/12/24 07:41  Glucose-Capillary 70 - 99 mg/dL 826 (H) 759 (H) 761 (H)   Diabetes history: DM 2 Outpatient Diabetes medications:  Novolog  2-12 units in the AM and PM Current orders for Inpatient glycemic control:  Novolog  0-15 units q 4 hours Vital 60 ml/hr Inpatient Diabetes Program Recommendations:   Consider adding Novolog  2-3 units q 4 hours for tube feed coverage.   Thanks,  Randall Bullocks, RN, BC-ADM Inpatient Diabetes Coordinator Pager [phone]  (8a-5p)

## 2024-02-12 NOTE — Progress Notes (Signed)
 Patient had third episode of bradycardia lasting around 1 minute. Appeared to be CHB on monitor, but unable to confirm with EKG. Blood pressure stable throughout and patient alert on ventilator. Not on sedation at this time.   Episode of vomiting. Tube feeds held at this time. Abdomen soft, patient had large BM earlier this shift.

## 2024-02-12 NOTE — Progress Notes (Signed)
  Progress Note   Date: 02/10/2024  Patient Name: David [name]        MRN#: [id number]  Clarification of diagnosis:  Severe malnutrition

## 2024-02-12 NOTE — Evaluation (Signed)
 Occupational Therapy Evaluation and Discharge from OT Patient Details Name: David [name] MRN: [id number] DOB: 1945-10-04 Today's Date: 02/12/2024   History of Present Illness   Mr. Pierpoint is a 78 year old long term resident from [facility] 02/03/24 due to dyspnea for the previous 2 weeks. CXR demonstrated pulmonary edema, Dx with acute on chronic CHF exacerbation and AKI on CKD stage IV 9/22 IR for dialysis catheter placement, complicated by bradycardia and subsequent PEA arrest ROSC obtained after 3 minutes CPR. He was intubated during the arrest. 9/22 had a second PEA arrest. 9/22 arterial line placed, HD L fem catheter placed. CRRT 9/22-25/25. PMH includes chronic combined systolic and diastolic heart failure (EF 30-35%), essential HTN, chronic hypoxic respiratory failure (on baseline 3L O2), CKD stage IV, CAD s/p , T2DM, chronic diabetic foot ulcers in bilateral lower extremities (ampitation recommended by Sx Prior to arrest)     Clinical Impressions Pt long term resident from [facility] (since Nov 2024?). No family present today during eval and Attempted multiple times to reach staff for PLOF and no answer when receptionist passed back to nursing staff Per therapy notes during Aug Admission he was getting assist for ADL, and working on max +2 lateral transfers to [facility]. Today Pt VSS on vent throughout session. Total A for all aspects of ADL and mobility. Pt would follow occasional one step commands open eyes and squeeze my hand inconsistently. When he does open eyes he shuts R and looks through L. Recommend post-acute LTC placement, without follow up. OT will sign off at this time.      If plan is discharge home, recommend the following:    (from LTC)     Functional Status Assessment   Patient has had a recent decline in their functional status and/or demonstrates limited ability to make significant improvements in function in a reasonable and predictable amount of  time     Equipment Recommendations   None recommended by OT     Recommendations for Other Services         Precautions/Restrictions   Precautions Precautions: Fall Recall of Precautions/Restrictions: Impaired Precaution/Restrictions Comments: L fem HD cath and A line, Bil mitts and wrist restraints Restrictions Weight Bearing Restrictions Per Provider Order: No     Mobility Bed Mobility Overal bed mobility: Needs Assistance Bed Mobility: Rolling Rolling: Total assist, +2 for physical assistance, +2 for safety/equipment         General bed mobility comments: total A +2 for all aspects of bed mobility. Today used bed into chair position    Transfers                   General transfer comment: NT this session. Recommend lift equipment      Balance Overall balance assessment: Needs assistance                                         ADL either performed or assessed with clinical judgement   ADL Overall ADL's : Needs assistance/impaired                                       General ADL Comments: Pt is currently total A for all aspects of ADL     Vision   Additional Comments: when Pt opened eyes,  he intentionally closed R and looked through L     Perception         Praxis         Pertinent Vitals/Pain Pain Assessment Pain Assessment: CPOT Facial Expression: Tense Body Movements: Protection Muscle Tension: Relaxed Compliance with ventilator (intubated pts.): Tolerating ventilator or movement Vocalization (extubated pts.): N/A CPOT Total: 2 Pain Intervention(s): Monitored during session, Repositioned     Extremity/Trunk Assessment Upper Extremity Assessment Upper Extremity Assessment: Difficult to assess due to impaired cognition (pulling against restraints, grasp 4/5)   Lower Extremity Assessment Lower Extremity Assessment: Defer to PT evaluation       Communication Communication Communication:  Impaired Factors Affecting Communication: Trach/intubated   Cognition Arousal: Obtunded Behavior During Therapy: Agitated Cognition: No family/caregiver present to determine baseline, Difficult to assess Difficult to assess due to: Intubated           OT - Cognition Comments: Pt would open eyes to command, squeeze hand to command. responses inconsistent and Pt frequently responds to touch and interaction by jerking hands in frustration, eyes mostly closed and when open closes R eye                 Following commands: Impaired Following commands impaired: Follows one step commands inconsistently     Cueing  General Comments   Cueing Techniques: Verbal cues;Tactile cues  VSS on Vent FiO2 40, PEEP 5.   Exercises Exercises: Other exercises Other Exercises Other Exercises: PROM of BUE, no contractures noted, fingers stiff   Shoulder Instructions      Home Living Family/patient expects to be discharged to:: Skilled nursing facility                                 Additional Comments: Has been at [facility] since Nov 2024n per chart review      Prior Functioning/Environment Prior Level of Function : Needs assist             Mobility Comments: Per CHART from LAST ADMISSION in AUG Pt reports he was working with therapy and able to sit EOB and lateral scoot to/from bed/w/c ADLs Comments: Per CHART from LAST ADMISSION in Aug: assist from rehab staff    OT Problem List: Cardiopulmonary status limiting activity   OT Treatment/Interventions:        OT Goals(Current goals can be found in the care plan section)   Acute Rehab OT Goals Patient Stated Goal: n/a OT Goal Formulation: All assessment and education complete, DC therapy   OT Frequency:       Co-evaluation PT/OT/SLP Co-Evaluation/Treatment: Yes Reason for Co-Treatment: Complexity of the patient's impairments (multi-system involvement);Necessary to address cognition/behavior during  functional activity PT goals addressed during session: Strengthening/ROM OT goals addressed during session: ADL's and self-care;Strengthening/ROM;Other (comment) (cognition)      AM-PAC OT 6 Clicks Daily Activity     Outcome Measure Help from another person eating meals?: Total Help from another person taking care of personal grooming?: Total Help from another person toileting, which includes using toliet, bedpan, or urinal?: Total Help from another person bathing (including washing, rinsing, drying)?: Total Help from another person to put on and taking off regular upper body clothing?: Total Help from another person to put on and taking off regular lower body clothing?: Total 6 Click Score: 6   End of Session Equipment Utilized During Treatment: Other (comment) (vent) Nurse Communication: Mobility status;Need for lift equipment  Activity Tolerance: Patient tolerated treatment well (as allowed by vent and cognition) Patient left: in bed;with call bell/phone within reach;with bed alarm set;with restraints reapplied  OT Visit Diagnosis: Other symptoms and signs involving the nervous system (R29.898);Adult, failure to thrive (R62.7)                Time: 8942-8885 OT Time Calculation (min): 17 min Charges:  OT General Charges $OT Visit: 1 Visit OT Evaluation $OT Eval High Complexity: 1 High  Leita DEL OTR/L Acute Rehabilitation Services Office: [phone]  Leita JINNY Odea 02/12/2024, 12:38 PM

## 2024-02-12 NOTE — TOC Progression Note (Signed)
 Transition of Care [facility]) - Progression Note    Patient Details  Name: David [name] MRN: [id number] Date of Birth: 1945-07-31  [facility] CM/SW Contact  Lauraine FORBES Saa, LCSWA Phone Number: 02/12/2024, 11:25 AM  Clinical Narrative:     11:26 AM Per progressions, patient stopped CRRT yesterday and is to start HD tomorrow. Patient remains intubated with NG/OG. CSW to submit FL2 to [facility] L[facility] upon patient's extubation. CSW will continue to follow and be available to assist.  Expected Discharge Plan: Long Term Nursing Home Barriers to Discharge: Continued Medical Work up               Expected Discharge Plan and Services In-house Referral: Clinical Social Work   Post Acute Care Choice: Skilled Nursing Facility Living arrangements for the past 2 months: Skilled Nursing Facility                                       Social Drivers of Health (SDOH) Interventions SDOH Screenings   Food Insecurity: Patient Unable To Answer (02/04/2024)  Housing: Patient Unable To Answer (02/04/2024)  Transportation Needs: No Transportation Needs (01/02/2024)  Utilities: Not At Risk (01/02/2024)  Alcohol Screen: Low Risk  (05/18/2023)  Financial Resource Strain: Low Risk  (05/18/2023)  Physical Activity: Inactive (08/20/2017)  Social Connections: Socially Integrated (01/02/2024)  Stress: No Stress Concern Present (08/20/2017)  Tobacco Use: Medium Risk (02/08/2024)    Readmission Risk Interventions    11/09/2023   11:30 AM 11/06/2023    8:44 AM  Readmission Risk Prevention Plan  Transportation Screening Complete Complete  Medication Review (RN Care Manager) Complete Complete  PCP or Specialist appointment within 3-5 days of discharge Complete   HRI or Home Care Consult Complete Complete  SW Recovery Care/Counseling Consult Complete Complete  Palliative Care Screening Not Applicable Not Applicable  Skilled Nursing Facility Complete Not Applicable

## 2024-02-12 NOTE — Progress Notes (Signed)
 Nutrition Follow-up  DOCUMENTATION CODES:  Severe malnutrition in context of chronic illness  INTERVENTION:  Continue tube feeding via cortrak: Vital 1.5 at 60 ml/h (1440 ml per day) Prosource TF20 60 ml BID Provides 2320 kcal, 137 gm protein, 1100 ml free water  daily MVI daily Pt is at risk for refeeding syndrome given severe malnutrition. Monitor magnesium  and phosphorus daily x 4 days, MD to replete as needed. 100mg  thiamine  x 5 days 1 packet Juven BID, each packet provides 95 calories, 2.5 grams of protein (collagen) + micronutrients to support wound healing   NUTRITION DIAGNOSIS:  Severe Malnutrition related to chronic illness (CHF, wounds) as evidenced by severe fat depletion, severe muscle depletion. - remains applicable  GOAL:  Patient will meet greater than or equal to 90% of their needs -progressing, TF at goal  MONITOR:  TF tolerance, I & O's, Vent status, Labs  REASON FOR ASSESSMENT:  Consult Assessment of nutrition requirement/status (CRRT)  ASSESSMENT:  Pt with hx of HTN, GERD, CHF, CAD, DM type 2, gastroparesis, CKD4, and anemia presented to ED from [facility] with SOB. Workup suggestive of CHF exacerbation.  9/17 - presented to ED from [facility] 9/21 - SLP BSE, DYS1/Nectar 9/22 - tunneled HD catheter attempted in IR but pt arrested x2, intubated and transferred to ICU 9/25 - CRRT discontinued 9/26 - cortrak placed  Pt resting in bed at the time of assessment, remains on vent support. No family in room. Pt underwent cortrak placement this AM so feeds could be continued when pt able to be extubated.   Feeds at goal, will continue at this time.   MV: 13.5 L/min Temp (24hrs), Avg:99 F (37.2 C), Min:97.7 F (36.5 C), Max:99.5 F (37.5 C) MAP (Art Line):  Admit weight: 95.3 kg  Current weight: 87.5 kg    Intake/Output Summary (Last 24 hours) at 02/12/2024 1057 Last data filed at 02/12/2024 0800 Gross per 24 hour  Intake 1857.68 ml   Output 1573 ml  Net 284.68 ml  Net IO Since Admission: -918.98 mL [02/12/24 1057]  Drains/Lines: Art Line, left femoral Non tunneled HD cath, left femoral triple lumen NGT (gastric) UOP out yesterday  Average Meal Intake: 9/17-9/21: 4% intake x 7 recorded meals  Nutritionally Relevant Medications: Scheduled Meds:  atorvastatin   80 mg Per Tube QHS   dicyclomine   10 mg Per Tube TID AC & HS   docusate  100 mg Oral BID   PROSource TF20  60 mL Per Tube BID   insulin  aspart  0-15 Units Subcutaneous Q4H   linezolid   600 mg Per Tube Q12H   multivitamin with minerals  1 tablet Per Tube Daily   JUVEN  1 packet Per Tube BID BM   pantoprazole  IV  40 mg Intravenous Daily   thiamine   100 mg Per Tube Daily   Continuous Infusions:  ampicillin -sulbactam (UNASYN ) IV 3 g (02/12/24 0926)   feeding supplement (VITAL 1.5 CAL) 60 mL/hr at 02/12/24 0800   PRN Meds: bisacodyl , ondansetron ,  polyethylene glycol  Labs Reviewed: BUN 40, creatinine 1.8 Phosphorus 2.2 Magnesium  2.5 Chloride 112 CBG ranges from 132-240 mg/dL over the last 24 hours HgbA1c 6.0% (10/30/23)  NUTRITION - FOCUSED PHYSICAL EXAM: Flowsheet Row Most Recent Value  Orbital Region Severe depletion  Upper Arm Region Severe depletion  Thoracic and Lumbar Region Moderate depletion  Buccal Region Severe depletion  Temple Region Severe depletion  Clavicle Bone Region Moderate depletion  Clavicle and Acromion Bone Region Severe depletion  Scapular Bone  Region Moderate depletion  Dorsal Hand Unable to assess  [mittens]  Patellar Region Severe depletion  Anterior Thigh Region Moderate depletion  Posterior Calf Region Moderate depletion  Edema (RD Assessment) Mild  [BLE]  Hair Reviewed  Eyes Reviewed  Mouth Reviewed  Skin Reviewed  Nails Unable to assess  [mittens]    Diet Order:   Diet Order             Diet NPO time specified  Diet effective midnight                  EDUCATION NEEDS:  Education needs  have been addressed  Skin:  Skin Assessment: Skin Integrity Issues: Per WOC 9/18: Unstageable pressure injury: - Left heel with 100% dry eschar, 3x3 cm - Right heel with 100% dry eschar, 2x2 cm Full thickness wound with osteomyelitis - Right outer foot 5X3cm, red with bloody drainage to anteror foot, eschar to plantar foot. Noted Orthopedics recommended amputation this admission, pt refused  Additional wounds per flowsheet: Diabetic Ulcer: - 3rd toe left foot (1 x 1 x 0.01 cm) - Right foot (5 x 3 cm) Stage 2: - left buttock (10 x 10 cm)  Last BM:  9/25 - type 7  Height:  Ht Readings from Last 1 Encounters:  02/12/24 6' 2 (1.88 m)    Weight:  Wt Readings from Last 1 Encounters:  02/12/24 87.5 kg    Ideal Body Weight:  86.4 kg  BMI:  Body mass index is 24.77 kg/m.  Estimated Nutritional Needs:  Kcal:  2300-2500 kcal/d Protein:  120-140g/d Fluid:  2.3L/d    Vernell Lukes, RD, LDN, CNSC Registered Dietitian II Please reach out via secure chat

## 2024-02-12 NOTE — Evaluation (Signed)
 Physical Therapy Evaluation Patient Details Name: David [name] MRN: [id number] DOB: 02-26-1946 Today's Date: 02/12/2024  History of Present Illness  David [name] is a 78 year old long term resident from [facility] 02/03/24 due to dyspnea for the previous 2 weeks. CXR demonstrated pulmonary edema, Dx with acute on chronic CHF exacerbation and AKI on CKD stage IV 9/22 IR for dialysis catheter placement, complicated by bradycardia and subsequent PEA arrest ROSC obtained after 3 minutes CPR. He was intubated during the arrest. 9/22 had a second PEA arrest. 9/22 arterial line placed, HD L fem catheter placed. CRRT 9/22-25/25. PMH includes chronic combined systolic and diastolic heart failure (EF 30-35%), essential HTN, chronic hypoxic respiratory failure (on baseline 3L O2), CKD stage IV, CAD s/p , T2DM, chronic diabetic foot ulcers in bilateral lower extremities (ampitation recommended by Sx Prior to arrest)  Clinical Impression  Patient presents with mobility close to recent baseline needing total A for all mobility.  He comes from long term care and noting medical issues currently and limited participation or interest in mobility this session feel he is not appropriate for skilled PT at this time.  PT will sign off.  Consult again if pt improves or is able to participate.          If plan is discharge home, recommend the following: Two people to help with bathing/dressing/bathroom;Two people to help with walking and/or transfers   Can travel by private vehicle   No    Equipment Recommendations None recommended by PT  Recommendations for Other Services       Functional Status Assessment Patient has not had a recent decline in their functional status     Precautions / Restrictions Precautions Precautions: Fall Recall of Precautions/Restrictions: Impaired Precaution/Restrictions Comments: L fem HD cath and A line, Bil mitts and wrist restraints      Mobility  Bed Mobility Overal  bed mobility: Needs Assistance Bed Mobility: Rolling Rolling: Total assist, +2 for physical assistance, +2 for safety/equipment         General bed mobility comments: total A +2 for all aspects of bed mobility. Today used bed into chair position; attempted to help pt pull forward to long sitting, though with increased time, cues and A pt only resisting and pushing back    Transfers                   General transfer comment: NT this session. Recommend lift equipment    Ambulation/Gait                  Stairs            Wheelchair Mobility     Tilt Bed    Modified Rankin (Stroke Patients Only)       Balance                                             Pertinent Vitals/Pain Pain Assessment Pain Assessment: CPOT Facial Expression: Relaxed, neutral Body Movements: Protection Muscle Tension: Relaxed Compliance with ventilator (intubated pts.): Tolerating ventilator or movement Vocalization (extubated pts.): N/A CPOT Total: 1 Pain Intervention(s): Monitored during session, Limited activity within patient's tolerance    Home Living Family/patient expects to be discharged to:: Skilled nursing facility                   Additional Comments: Has  been at [facility] since Nov 2024n per chart review    Prior Function Prior Level of Function : Needs assist             Mobility Comments: Per CHART from LAST ADMISSION in AUG Pt reports he was working with therapy and able to sit EOB and lateral scoot to/from bed/w/c ADLs Comments: Per CHART from LAST ADMISSION in Aug: assist from rehab staff     Extremity/Trunk Assessment   Upper Extremity Assessment Upper Extremity Assessment: Defer to OT evaluation    Lower Extremity Assessment Lower Extremity Assessment: RLE deficits/detail;LLE deficits/detail RLE Deficits / Details: Does not move to command, PROM/AAROM stiffness in ankles and noted with wraps on bilateral feet,  areas of open wounds on bilateral lower legs; hip and knee flexion limited with assist and pt indicates painful LLE Deficits / Details: Does not move to command, PROM/AAROM stiffness in ankles and noted with wraps on bilateral feet, areas of open wounds on bilateral lower legs; hip and knee flexion limited with assist and pt indicates painful       Communication   Communication Communication: Impaired Factors Affecting Communication: Trach/intubated    Cognition Arousal: Obtunded Behavior During Therapy: Agitated   PT - Cognitive impairments: Difficult to assess Difficult to assess due to: Intubated                       Following commands: Impaired Following commands impaired: Follows one step commands inconsistently     Cueing Cueing Techniques: Verbal cues, Gestural cues, Tactile cues     General Comments General comments (skin integrity, edema, etc.): VSS on vent PRVC 40% FiO2 PEEP of 5; coretrack just placed prior to session    Exercises     Assessment/Plan    PT Assessment Patient does not need any further PT services  PT Problem List         PT Treatment Interventions      PT Goals (Current goals can be found in the Care Plan section)  Acute Rehab PT Goals PT Goal Formulation: All assessment and education complete, DC therapy    Frequency       Co-evaluation   Reason for Co-Treatment: Complexity of the patient's impairments (multi-system involvement);Necessary to address cognition/behavior during functional activity PT goals addressed during session: Strengthening/ROM OT goals addressed during session: ADL's and self-care;Strengthening/ROM;Other (comment) (cognition)       AM-PAC PT 6 Clicks Mobility  Outcome Measure Help needed turning from your back to your side while in a flat bed without using bedrails?: Total Help needed moving from lying on your back to sitting on the side of a flat bed without using bedrails?: Total Help needed  moving to and from a bed to a chair (including a wheelchair)?: Total Help needed standing up from a chair using your arms (e.g., wheelchair or bedside chair)?: Total Help needed to walk in hospital room?: Total Help needed climbing 3-5 steps with a railing? : Total 6 Click Score: 6    End of Session   Activity Tolerance: Treatment limited secondary to agitation Patient left: in bed Nurse Communication: Other (comment) (plan for PT to sign off)      Time: 8945-8885 PT Time Calculation (min) (ACUTE ONLY): 20 min   Charges:   PT Evaluation $PT Eval Moderate Complexity: 1 Mod   PT General Charges $$ ACUTE PT VISIT: 1 Visit         Micheline Portal, PT Acute Rehabilitation Services [phone]  02/12/2024   [name] Portal 02/12/2024, 1:57 PM

## 2024-02-12 NOTE — Progress Notes (Signed)
 NAME:  David [name], MRN:  [id number], DOB:  [birth year], LOS: 9 ADMISSION DATE:  02/03/2024, CONSULTATION DATE:  02/08/2024 REFERRING MD:  Dr Noralee, CHIEF COMPLAINT:  PEA Arrest   History of Present Illness:  David [name] is a 78 year old gentleman with PMH of chronic combined systolic and diastolic heart failure (EF 30-35%), essential HTN, chronic hypoxic respiratory failure (on baseline 3L O2), CKD stage IV, CAD s/p , T2DM, chronic diabetic foot ulcers in bilateral lower extremities who presented to the hospital from SNF on 9/17 for worsening dyspnea for the previous 2 weeks. In the ED a CXR demonstrated pulmonary edema, BNP was elevated to 704, Cr was elevated to 5.01. He was admitted to [facility] for heart failure exacerbation.    Nephrology was consulted for AKI on CKD versus progression of CKD and ultimately decision was made to pursue HD for volume management. He went to IR on 9/22 for dialysis catheter placement. During advancement of the guidewire in the right internal jugular he became bradycardic and subsequently became unresponsive found to be in PEA arrest. CPR was started and procedure was aborted. He was intubated during the arrest. After 3 minutes of CPR, epi, calcium , and sodium bicarbonate  pushes ROSC was achieved. He was transferred to [facility] ICU for further management.   Pertinent  Medical History     has a past medical history of Anemia, Arthritis, CAD in native artery, CHF (congestive heart failure) (HCC), CKD (chronic kidney disease) stage 3, GFR 30-59 ml/min (HCC), DDD (degenerative disc disease), cervical, Diabetes mellitus with complication (HCC), Diabetic neuropathy (HCC), Diabetic retinopathy (HCC), GERD (gastroesophageal reflux disease), Glaucoma, and Hypertension.   has a past surgical history that includes Joint replacement; Cyst excision; Total hip arthroplasty; Back surgery; Amputation (Right, 07/09/2016); RIGHT/LEFT HEART CATH AND CORONARY ANGIOGRAPHY (N/A, 04/06/2017);  CORONARY STENT INTERVENTION (N/A, 04/06/2017); CORONARY STENT INTERVENTION (N/A, 04/07/2017); Cardiac catheterization; Laparoscopic abdominal exploration (N/A, 11/26/2017); Amputation (Right, 05/28/2018); Toe Surgery (05/2018); Endotracheal intubation emergent (08/07/2018); Amputation (Left, 08/03/2020); Esophagogastroduodenoscopy (Left, 11/07/2023); and IR TUNNELED CENTRAL VENOUS CATH PLC W IMG (02/08/2024).   Significant Hospital Events: Including procedures, antibiotic start and stop dates in addition to other pertinent events   9/17 Admitted to [facility] for acute on chronic CHF exacerbation and AKI on CKD stage IV 9/22 IR for dialysis catheter placement, complicated by bradycardia and subsequent PEA arrest while advancing guidewire. ROSC obtained after 3 minutes CPR. He was intubated during the arrest. 9/22 had a second PEA arrest 9/22 arterial line placed, HD catheter placed 9/24-remains on CRRT 02/11/24: No overnight events. 2 PEA arrest on 02/08/2024. Hemodynamics remained stable, responsive to verbal stimuli, not able to follow commands however was told that he was able to squeeze with his hands earlier  Interim History / Subjective:    02/12/24: Failed SBT in 2h but was on fent gtt at 25mcg at that time. Now on precedex  gtt and off fent gtt . Followed some commands from RN. NExt HD is tomorrow.  On TF. On also on chronic trazone, chronic zoloft , and new since yesterday klonopin  and seroquel . Precedex  dropping to HR 30   Objective    Blood pressure (!) 114/100, pulse 76, temperature 98.2 F (36.8 C), resp. rate (!) 22, height 6' 2 (1.88 m), weight 87.5 kg, SpO2 99%.    Vent Mode: PRVC FiO2 (%):  [40 %] 40 % Set Rate:  [22 bmp] 22 bmp Vt Set:  [650 mL] 650 mL PEEP:  [5 cmH20] 5 cmH20 Pressure Support:  [87  cmH20-15 cmH20] 12 cmH20 Plateau Pressure:  [29 cmH20-38 cmH20] 29 cmH20   Intake/Output Summary (Last 24 hours) at 02/12/2024 1219 Last data filed at 02/12/2024 1000 Gross per 24 hour   Intake 1587.6 ml  Output 1196 ml  Net 391.6 ml   Filed Weights   02/10/24 0500 02/11/24 0403 02/12/24 0500  Weight: 91.3 kg 86.6 kg 87.5 kg    Examination: .  General Appearance:  Looks criticall ill  Head:  Normocephalic, without obvious abnormality, atraumatic Eyes:  PERRL - yes, conjunctiva/corneas - muddy     Ears:  Normal external ear canals, both ears Nose:  G tube - no Throat:  ETT TUBE - yes , OG tube - yes Neck:  Supple,  No enlargement/tenderness/nodules Lungs: Clear to auscultation bilaterally, Ventilator   Synchrony - yes Heart:  S1 and S2 normal, no murmur, CVP - no.  Pressors - no Abdomen:  Soft, no masses, no organomegaly Genitalia / Rectal:  Not done Extremities:  Extremities- intac Skin:  ntact in exposed areas . Sacral area - not examined Neurologic:  Sedation - none -> RASS - -4 . Moves all 4s - yes. CAM-ICU - sedated . Orientation - not     Assessment and Plan   78 year old gentleman with systolic and diastolic heart failure, came in with decompensation, acute kidney injury Had a PEA arrest x 2-successfully resuscitated  9/26: He is hemodynamically stable.    Plan  - MAP goal >65    Mixed shock Decompensated heart failure, systolic and diastolic  - Off pressors  PLAN - Maintain MAP greater than 65  Pulmonary hypertension - Secondary to group 2 disease  Atrial fibrillation - Continue monitoring - Replete electrolytes  Acute kidney injury  Plan  For HD 02/13/24  Acute hypoxemic respiratory failure  02/12/2024 - > does NOT meet criteria forExtubation in setting of Acute Respiratory Failure due - failed sbt - apenic but was on fent gtt.   Plan   - await improvements after HD 02/13/24  - SEdation plan with less medicines that retain in system (see bel(W_  - PRVC - VAP bundle  -Ventilator associated pneumonia prevention protocol  S/p cardiac arrest PEA - at risk for congittivie issue Sedation needs on ventilator  - followed  commands 9/26 . Fent gt made him apneic on ventilator. Precedex  made him drop HR  Plan  - continue neuro protective measures - avoid meds that retain in system   - dc kolonopin scheduled   - dc'ed fent gtt - try diprivan  gtt -> if does not tolerate then back to fentt gtt - do fent prn  - do versed  prn  - continue home zoloft  and desyrul - ok for seroquel  for agitgtion since 02/11/24   Right foot osteomyelitis - Gangrene of the heel  PLAN - Continue antibiotics - Recommendation is for amputation - This plan is held at the present time pending stabilization  Anemia of chronic disease - - Did receive 1 unit PRBC on 01/09/2024  Plan  - - PRBC for hgb </= 6.9gm%    - exceptions are   -  if ACS susepcted/confirmed then transfuse for hgb </= 8.0gm%,  or    -  active bleeding with hemodynamic instability, then transfuse regardless of hemoglobin value   At at all times try to transfuse 1 unit prbc as possible with exception of active hemorrhage   Type 2 diabetes  - Continue SSI  Prognosis is guarded Patient is DNR,    PLAN  -  continue current measures  PCCM    - did update patient's daughter at bedside 02/10/2024 and - Dr Geronimo:  in 02/12/24 over phone (daugther David [name] said wife has dementia) [phone] [facility] but visiting) - wants daily updates       ATTESTATION & SIGNATURE   The patient David [name] is critically ill with multiple organ systems failure and requires high complexity decision making for assessment and support, frequent evaluation and titration of therapies, application of advanced monitoring technologies and extensive interpretation of multiple databases and discussion with other appropriate health care personnel such as bedside nurses, social workers, case Production designer, theatre/television/film, consultants, respiratory therapists, nutritionists, secretaries etc.,  Critical care time includes but is not restricted to just documentation time. Documentation can happen  in parallel or sequential to care time depending on case mix urgency and priorities for the shift. So, overall critical Care Time devoted to patient care services described in this note is  35  Minutes.   This time reflects time of care of this signee Dr Dorethia Geronimo which includ does not reflect procedure time, or teaching time or supervisory time of PA/NP/Med student/Med Resident etc but could involve care discussion time     Dr. Dorethia Geronimo, M.D., [facility].C.P Pulmonary and Critical Care Medicine Staff Physician, [facility] Pager: [phone], If no answer or between  15:00h - 7:00h: call [phone]  02/12/2024 12:19 PM    LABS    PULMONARY Recent Labs  Lab 02/08/24 1446  PHART 7.352  PCO2ART 29.9*  PO2ART 338*  HCO3 17.0*  TCO2 18*  O2SAT 100    CBC Recent Labs  Lab 02/08/24 1513 02/09/24 0437 02/09/24 2003 02/11/24 0430  HGB 8.3* 6.8* 7.9* 8.0*  HCT 27.9* 21.3* 23.8* 23.7*  WBC 11.7* 8.6  --  9.5  PLT 121* 130*  --  86*    COAGULATION No results for input(s): INR in the last 168 hours.  CARDIAC  No results for input(s): TROPONINI in the last 168 hours. No results for input(s): PROBNP in the last 168 hours.   CHEMISTRY Recent Labs  Lab 02/08/24 1513 02/09/24 0437 02/09/24 1758 02/10/24 0309 02/10/24 1718 02/11/24 0430 02/11/24 1514 02/12/24 0354  NA 147* 144   < > 138 137 135 134* 135  K 4.6 3.8   < > 3.6 4.0 4.2 4.2 4.3  CL 117* 112*   < > 106 105 103 101 102  CO2 18* 17*   < > 20* 21* 21* 21* 21*  GLUCOSE 139* 103*   < > 169* 188* 165* 151* 257*  BUN 122* 83*   < > 40* 34* 30* 27* 40*  CREATININE 6.21* 4.23*   < > 2.25* 1.71* 1.47* 1.27* 1.80*  CALCIUM  9.7 8.3*   < > 7.6* 7.6* 7.6* 7.5* 7.4*  MG 3.1* 2.9*  --  2.5*  --  2.4  --  2.5*  PHOS  --  2.7   < > 1.8* 2.4* 1.5* 3.2 2.2*   < > = values in this interval not displayed.   Estimated Creatinine Clearance: 40.6 mL/min (A) (by  C-G formula based on SCr of 1.8 mg/dL (H)).   LIVER Recent Labs  Lab 02/08/24 1513 02/09/24 0437 02/10/24 0309 02/10/24 1718 02/11/24 0430 02/11/24 1514 02/12/24 0354  AST 31  --   --   --   --   --   --   ALT 19  --   --   --   --   --   --  ALKPHOS 102  --   --   --   --   --   --   BILITOT 1.2  --   --   --   --   --   --   PROT 7.7  --   --   --   --   --   --   ALBUMIN  2.3*   < > 1.9* 2.0* 2.0* 2.0* 1.9*   < > = values in this interval not displayed.     INFECTIOUS Recent Labs  Lab 02/08/24 1513  LATICACIDVEN 1.1     ENDOCRINE CBG (last 3)  Recent Labs    02/12/24 0320 02/12/24 0741 02/12/24 1125  GLUCAP 240* 238* 159*         IMAGING x48h  - image(s) personally visualized  -   highlighted in bold No results found.

## 2024-02-12 NOTE — Progress Notes (Signed)
 Admit: 02/03/2024 LOS: 9  54M with AKI on CKD4 vs progressive CKD; Cardiac arrest x2 02/08/24   Subjective:  Doing well off CRRT, remains on ventilator Slightly net negative yesterday, minimal urine output No pressors, blood pressure stable  09/25 0701 - 09/26 0700 In: 2146.8 [I.V.:229.8; NG/GT:1470.7; IV Piggyback:350.3] Out: 2278 [Urine:135]  Filed Weights   02/10/24 0500 02/11/24 0403 02/12/24 0500  Weight: 91.3 kg 86.6 kg 87.5 kg    Scheduled Meds:  sodium chloride    Intravenous Once   aspirin   81 mg Per Tube Daily   atorvastatin   80 mg Per Tube QHS   Chlorhexidine  Gluconate Cloth  6 each Topical Daily   Chlorhexidine  Gluconate Cloth  6 each Topical Q0600   clonazePAM   0.5 mg Per Tube QHS   darbepoetin (ARANESP ) injection - DIALYSIS  60 mcg Subcutaneous Q Thu-1800   dicyclomine   10 mg Per Tube TID AC & HS   docusate  100 mg Oral BID   feeding supplement (PROSource TF20)  60 mL Per Tube BID   fentaNYL  (SUBLIMAZE ) injection  25-50 mcg Intravenous Once   heparin   5,000 Units Subcutaneous Q8H   insulin  aspart  0-15 Units Subcutaneous Q4H   latanoprost   1 drop Both Eyes QHS   lidocaine  (PF)  20 mL Infiltration Once   linezolid   600 mg Per Tube Q12H   multivitamin with minerals  1 tablet Per Tube Daily   nutrition supplement (JUVEN)  1 packet Per Tube BID BM   mouth rinse  15 mL Mouth Rinse Q2H   pantoprazole  (PROTONIX ) IV  40 mg Intravenous Daily   QUEtiapine   25 mg Per Tube QHS   sertraline   25 mg Per Tube Daily   sodium chloride  flush  3 mL Intravenous Q12H   thiamine   100 mg Per Tube Daily   traZODone   100 mg Per Tube QHS   Continuous Infusions:  ampicillin -sulbactam (UNASYN ) IV 3 g (02/12/24 0926)   feeding supplement (VITAL 1.5 CAL) 60 mL/hr at 02/12/24 0800   fentaNYL  infusion INTRAVENOUS 75 mcg/hr (02/12/24 0800)   PRN Meds:.acetaminophen  **OR** acetaminophen , bisacodyl , fentaNYL , heparin , midazolam , nitroGLYCERIN , ondansetron  **OR** ondansetron  (ZOFRAN ) IV,  mouth rinse, polyethylene glycol, sodium chloride , sodium chloride  flush  Current Labs: reviewed   Physical Exam:  Blood pressure (!) 114/100, pulse 76, temperature 98.2 F (36.8 C), resp. rate (!) 22, height 6' 2 (1.88 m), weight 87.5 kg, SpO2 99%. Intubated and sedated Regular: No rub, normal S1-S2 Coarse breath sounds bilaterally Soft, nontender Lower extremity wounds bandaged Left femoral temporary HD catheter bandaged, clean and intact  A AKI on CKD 4 versus progression of CKD, starting HD during admission on 9/22, off CRRT 9/25; at this point he is likely ESRD PEA arrest 02/08/2024 x2 Urinary retention status post Foley and now on tamsulosin  Hypertension: Blood pressures have normalized after cardiac arrest Metabolic acidosis: Improved with dialysis Anemia, transfusion per CCM, on ESA Foot wounds and osteomyelitis of the fifth proximal phalanx of fifth metatarsal head on the right side on linezolid  and Unasyn  with orthopedics following Chronic DM2 Hypophosphatemia should improve now that CRRT is stopped  P Stable, plan for intermittent hemodialysis tomorrow After dialysis tomorrow likely can remove femoral HD catheter in reattempt placement of tunneled HD catheter. Once more stable can evaluate for AV fistula/graft as well. Medication Issues; Preferred narcotic agents for pain control are hydromorphone , fentanyl , and methadone. Morphine  should not be used.  Baclofen should be avoided Avoid oral sodium phosphate  and magnesium  citrate based laxatives /  bowel preps   Plan discussed with Dr. Neda and primary RN   Bernardino Gasman MD 02/12/2024, 11:32 AM  Recent Labs  Lab 02/11/24 0430 02/11/24 1514 02/12/24 0354  NA 135 134* 135  K 4.2 4.2 4.3  CL 103 101 102  CO2 21* 21* 21*  GLUCOSE 165* 151* 257*  BUN 30* 27* 40*  CREATININE 1.47* 1.27* 1.80*  CALCIUM  7.6* 7.5* 7.4*  PHOS 1.5* 3.2 2.2*   Recent Labs  Lab 02/08/24 1513 02/09/24 0437 02/09/24 2003  02/11/24 0430  WBC 11.7* 8.6  --  9.5  HGB 8.3* 6.8* 7.9* 8.0*  HCT 27.9* 21.3* 23.8* 23.7*  MCV 113.0* 104.4*  --  96.3  PLT 121* 130*  --  86*

## 2024-02-12 NOTE — Progress Notes (Addendum)
   Call from RN  1 min bradycardia to the 20s > 1h ago with vomiting and then settled (never got zofran  though is MAR)  Then again 45 sec of bradycardia with aline losing wave form now  Off all sedation -  MEd review shows zofran  -not gotten it, bentyl  - getting it scheduled  A Episodes of bradyucardia Vomiting Good BM -   Plan  Stat EKG Stat abg Cbc Cmet Lactate Trop KUB  Dc bentyl  Dc zofran    If needed consider levophed  gtt/atropine  pushes  Xxxx Addendu, AFTERNOON LABs - mild LOW PHOS =->replete - ioneized calcium  low -> replete   SIGNATURE    Dr. Dorethia Cave, M.D., F.C.C.P,  Pulmonary and Critical Care Medicine Staff Physician, [facility] Center Director - Interstitial Lung Disease  Program  Pulmonary Fibrosis [facility] at [facility] [place], [place], [address]   Pager: [phone], If no answer  -> Check AMION or Try [phone] Telephone (clinical office): [phone] Telephone (research): [phone]  6:46 PM 02/12/2024

## 2024-02-12 NOTE — Progress Notes (Signed)
 PM rounds  Stable but did not toelrate diprivan  either - got brady per RN Currently off sedation and RASs -2/-3  Plan  Dc diprivan  out of MAR  Do fent prn Do versed  prn If needed go back to to fent gtt Dc aline 02/13/24     SIGNATURE    Dr. Dorethia Cave, M.D., F.C.C.P,  Pulmonary and Critical Care Medicine Staff Physician, [facility] Center Director - Interstitial Lung Disease  Program  Pulmonary Fibrosis [facility] at [facility] [place], [place], [address]   Pager: [phone], If no answer  -> Check AMION or Try [phone] Telephone (clinical office): [phone] Telephone (research): [phone]  5:03 PM 02/12/2024

## 2024-02-13 ENCOUNTER — Inpatient Hospital Stay (HOSPITAL_COMMUNITY)

## 2024-02-13 DIAGNOSIS — I469 Cardiac arrest, cause unspecified: Secondary | ICD-10-CM | POA: Diagnosis not present

## 2024-02-13 DIAGNOSIS — N179 Acute kidney failure, unspecified: Secondary | ICD-10-CM | POA: Diagnosis not present

## 2024-02-13 DIAGNOSIS — J9601 Acute respiratory failure with hypoxia: Secondary | ICD-10-CM | POA: Diagnosis not present

## 2024-02-13 DIAGNOSIS — I5033 Acute on chronic diastolic (congestive) heart failure: Secondary | ICD-10-CM | POA: Diagnosis not present

## 2024-02-13 LAB — POCT I-STAT 7, (LYTES, BLD GAS, ICA,H+H)
Acid-Base Excess: 2 mmol/L (ref 0.0–2.0)
Bicarbonate: 24 mmol/L (ref 20.0–28.0)
Calcium, Ion: 1.07 mmol/L — ABNORMAL LOW (ref 1.15–1.40)
HCT: 29 % — ABNORMAL LOW (ref 39.0–52.0)
Hemoglobin: 9.9 g/dL — ABNORMAL LOW (ref 13.0–17.0)
O2 Saturation: 100 %
Patient temperature: 97.9
Potassium: 4 mmol/L (ref 3.5–5.1)
Sodium: 141 mmol/L (ref 135–145)
TCO2: 25 mmol/L (ref 22–32)
pCO2 arterial: 27.7 mmHg — ABNORMAL LOW (ref 32–48)
pH, Arterial: 7.545 — ABNORMAL HIGH (ref 7.35–7.45)
pO2, Arterial: 151 mmHg — ABNORMAL HIGH (ref 83–108)

## 2024-02-13 LAB — COMPREHENSIVE METABOLIC PANEL WITH GFR
ALT: 47 U/L — ABNORMAL HIGH (ref 0–44)
AST: 139 U/L — ABNORMAL HIGH (ref 15–41)
Albumin: 2 g/dL — ABNORMAL LOW (ref 3.5–5.0)
Alkaline Phosphatase: 278 U/L — ABNORMAL HIGH (ref 38–126)
Anion gap: 15 (ref 5–15)
BUN: 53 mg/dL — ABNORMAL HIGH (ref 8–23)
CO2: 21 mmol/L — ABNORMAL LOW (ref 22–32)
Calcium: 7.8 mg/dL — ABNORMAL LOW (ref 8.9–10.3)
Chloride: 104 mmol/L (ref 98–111)
Creatinine, Ser: 2.28 mg/dL — ABNORMAL HIGH (ref 0.61–1.24)
GFR, Estimated: 29 mL/min — ABNORMAL LOW (ref >60)
Glucose, Bld: 111 mg/dL — ABNORMAL HIGH (ref 70–99)
Potassium: 4 mmol/L (ref 3.5–5.1)
Sodium: 140 mmol/L (ref 135–145)
Total Bilirubin: 1.3 mg/dL — ABNORMAL HIGH (ref 0.0–1.2)
Total Protein: 7.7 g/dL (ref 6.5–8.1)

## 2024-02-13 LAB — CBC
HCT: 27.9 % — ABNORMAL LOW (ref 39.0–52.0)
Hemoglobin: 9.3 g/dL — ABNORMAL LOW (ref 13.0–17.0)
MCH: 32.5 pg (ref 26.0–34.0)
MCHC: 33.3 g/dL (ref 30.0–36.0)
MCV: 97.6 fL (ref 80.0–100.0)
Platelets: 98 K/uL — ABNORMAL LOW (ref 150–400)
RBC: 2.86 MIL/uL — ABNORMAL LOW (ref 4.22–5.81)
RDW: 26.2 % — ABNORMAL HIGH (ref 11.5–15.5)
WBC: 18.4 K/uL — ABNORMAL HIGH (ref 4.0–10.5)
nRBC: 0 % (ref 0.0–0.2)

## 2024-02-13 LAB — RENAL FUNCTION PANEL
Albumin: 2 g/dL — ABNORMAL LOW (ref 3.5–5.0)
Albumin: 2.5 g/dL — ABNORMAL LOW (ref 3.5–5.0)
Anion gap: 15 (ref 5–15)
Anion gap: 15 (ref 5–15)
BUN: 54 mg/dL — ABNORMAL HIGH (ref 8–23)
BUN: 19 mg/dL (ref 8–23)
CO2: 21 mmol/L — ABNORMAL LOW (ref 22–32)
CO2: 26 mmol/L (ref 22–32)
Calcium: 7.7 mg/dL — ABNORMAL LOW (ref 8.9–10.3)
Calcium: 8.7 mg/dL — ABNORMAL LOW (ref 8.9–10.3)
Chloride: 102 mmol/L (ref 98–111)
Chloride: 95 mmol/L — ABNORMAL LOW (ref 98–111)
Creatinine, Ser: 2.2 mg/dL — ABNORMAL HIGH (ref 0.61–1.24)
Creatinine, Ser: 1.17 mg/dL (ref 0.61–1.24)
GFR, Estimated: 30 mL/min — ABNORMAL LOW (ref >60)
GFR, Estimated: >60 mL/min (ref >60)
Glucose, Bld: 109 mg/dL — ABNORMAL HIGH (ref 70–99)
Glucose, Bld: 95 mg/dL (ref 70–99)
Phosphorus: 2.8 mg/dL (ref 2.5–4.6)
Phosphorus: 1.5 mg/dL — ABNORMAL LOW (ref 2.5–4.6)
Potassium: 3.9 mmol/L (ref 3.5–5.1)
Potassium: 3.4 mmol/L — ABNORMAL LOW (ref 3.5–5.1)
Sodium: 138 mmol/L (ref 135–145)
Sodium: 136 mmol/L (ref 135–145)

## 2024-02-13 LAB — COOXEMETRY PANEL
Carboxyhemoglobin: 2.3 % — ABNORMAL HIGH (ref 0.5–1.5)
Methemoglobin: <0.7 % (ref 0.0–1.5)
O2 Saturation: 81.7 %
Total hemoglobin: 10.5 g/dL — ABNORMAL LOW (ref 12.0–16.0)

## 2024-02-13 LAB — MAGNESIUM: Magnesium: 2.6 mg/dL — ABNORMAL HIGH (ref 1.7–2.4)

## 2024-02-13 LAB — CULTURE, BLOOD (ROUTINE X 2)
Culture: NO GROWTH
Culture: NO GROWTH
Special Requests: ADEQUATE
Special Requests: ADEQUATE

## 2024-02-13 LAB — GLUCOSE, CAPILLARY
Glucose-Capillary: 109 mg/dL — ABNORMAL HIGH (ref 70–99)
Glucose-Capillary: 115 mg/dL — ABNORMAL HIGH (ref 70–99)
Glucose-Capillary: 138 mg/dL — ABNORMAL HIGH (ref 70–99)
Glucose-Capillary: 66 mg/dL — ABNORMAL LOW (ref 70–99)
Glucose-Capillary: 87 mg/dL (ref 70–99)
Glucose-Capillary: 90 mg/dL (ref 70–99)

## 2024-02-13 MED ORDER — PENTAFLUOROPROP-TETRAFLUOROETH EX AERO: 1.0000 | INHALATION_SPRAY | CUTANEOUS | Status: DC | PRN

## 2024-02-13 MED ORDER — PROCHLORPERAZINE EDISYLATE 10 MG/2ML IJ SOLN
5.0000 mg | Freq: Four times a day (QID) | INTRAMUSCULAR | Status: DC | PRN
  Administered 2024-02-13 – 2024-02-14 (×5): 5 mg via INTRAVENOUS
  Filled 2024-02-13 (×8): qty 1

## 2024-02-13 MED ORDER — ANTICOAGULANT SODIUM CITRATE 4% (200MG/5ML) IV SOLN: 5.0000 mL | Status: DC | PRN

## 2024-02-13 MED ORDER — HEPARIN SODIUM (PORCINE) 1000 UNIT/ML DIALYSIS: 1000.0000 [IU] | INTRAMUSCULAR | Status: DC | PRN

## 2024-02-13 MED ORDER — ALTEPLASE 2 MG IJ SOLR: 2.0000 mg | Freq: Once | INTRAMUSCULAR | Status: DC | PRN

## 2024-02-13 MED ORDER — HEPARIN SODIUM (PORCINE) 1000 UNIT/ML DIALYSIS
1000.0000 [IU] | INTRAMUSCULAR | Status: DC | PRN
  Administered 2024-02-13: 2800 [IU]

## 2024-02-13 MED ORDER — DEXTROSE 50 % IV SOLN
12.5000 g | INTRAVENOUS | Status: AC
  Administered 2024-02-14: 12.5 g via INTRAVENOUS
  Filled 2024-02-13: qty 50

## 2024-02-13 MED ORDER — LIDOCAINE HCL (PF) 1 % IJ SOLN: 5.0000 mL | INTRAMUSCULAR | Status: DC | PRN

## 2024-02-13 MED ORDER — KETAMINE HCL-SODIUM CHLORIDE 1000-0.69 MG/100ML-% IV SOLN
1.0000 mg/kg/h | INTRAVENOUS | Status: DC
  Filled 2024-02-13: qty 100

## 2024-02-13 MED ORDER — CALCIUM GLUCONATE-NACL 1-0.675 GM/50ML-% IV SOLN
1.0000 g | Freq: Once | INTRAVENOUS | Status: AC
  Administered 2024-02-13: 1000 mg via INTRAVENOUS
  Filled 2024-02-13: qty 50

## 2024-02-13 MED ORDER — ATROPINE SULFATE 1 MG/10ML IJ SOSY
PREFILLED_SYRINGE | INTRAMUSCULAR | Status: AC
  Filled 2024-02-13: qty 10

## 2024-02-13 MED ORDER — KETAMINE HCL-SODIUM CHLORIDE 1000-0.69 MG/100ML-% IV SOLN: 1.0000 mg/kg/h | INTRAVENOUS | Status: DC

## 2024-02-13 MED ORDER — LIDOCAINE-PRILOCAINE 2.5-2.5 % EX CREA: 1.0000 | TOPICAL_CREAM | CUTANEOUS | Status: DC | PRN

## 2024-02-13 NOTE — Plan of Care (Signed)
   Problem: Clinical Measurements: Goal: Ability to maintain clinical measurements within normal limits will improve Outcome: Progressing Goal: Will remain free from infection Outcome: Progressing Goal: Diagnostic test results will improve Outcome: Progressing Goal: Respiratory complications will improve Outcome: Progressing Goal: Cardiovascular complication will be avoided Outcome: Progressing   Problem: Activity: Goal: Risk for activity intolerance will decrease Outcome: Progressing   Problem: Coping: Goal: Level of anxiety will decrease Outcome: Progressing   Problem: Elimination: Goal: Will not experience complications related to bowel motility Outcome: Progressing Goal: Will not experience complications related to urinary retention Outcome: Progressing   Problem: Pain Managment: Goal: General experience of comfort will improve and/or be controlled Outcome: Progressing   Problem: Safety: Goal: Ability to remain free from injury will improve Outcome: Progressing   Problem: Skin Integrity: Goal: Risk for impaired skin integrity will decrease Outcome: Progressing

## 2024-02-13 NOTE — Progress Notes (Signed)
 Arrived in patient['s room,he follows command ,he is intubated.Consent  for hd treatment verifed.  Access used: Right temp femoral catheter that worked well.Dressing on date.  Duration of treatment: 3.5 hours.  Uf goal :  Met 3 L,tolerated treatment.  Hand off to the patients nurse at bedside with stable vitals.

## 2024-02-13 NOTE — Plan of Care (Signed)
  Problem: Clinical Measurements: Goal: Ability to maintain clinical measurements within normal limits will improve Outcome: Progressing Goal: Will remain free from infection Outcome: Progressing Goal: Diagnostic test results will improve Outcome: Progressing Goal: Respiratory complications will improve Outcome: Progressing Goal: Cardiovascular complication will be avoided Outcome: Progressing   Problem: Activity: Goal: Risk for activity intolerance will decrease Outcome: Progressing   Problem: Nutrition: Goal: Adequate nutrition will be maintained Outcome: Not Progressing

## 2024-02-13 NOTE — Progress Notes (Signed)
 Admit: 02/03/2024 LOS: 10  89M with AKI on CKD4 vs progressive CKD; Cardiac arrest x2 02/08/24   Subjective:  Intermittent bradycardia yesterday Blood pressure stable here this morning 0.4 L urine output recorded K4.0, BUN 53, creatinine 2.3 Has left femoral temporary HD catheter Remains on ventilator  09/26 0701 - 09/27 0700 In: 1230.3 [I.V.:37; NG/GT:690; IV Piggyback:503.4] Out: 357 [Urine:357]  Filed Weights   02/11/24 0403 02/12/24 0500 02/13/24 0357  Weight: 86.6 kg 87.5 kg 87.5 kg    Scheduled Meds:  sodium chloride    Intravenous Once   aspirin   81 mg Per Tube Daily   atorvastatin   80 mg Per Tube QHS   Chlorhexidine  Gluconate Cloth  6 each Topical Daily   Chlorhexidine  Gluconate Cloth  6 each Topical Q0600   Chlorhexidine  Gluconate Cloth  6 each Topical Q0600   darbepoetin (ARANESP ) injection - DIALYSIS  60 mcg Subcutaneous Q Thu-1800   docusate  100 mg Per Tube BID   feeding supplement (PROSource TF20)  60 mL Per Tube BID   heparin   5,000 Units Subcutaneous Q8H   insulin  aspart  0-20 Units Subcutaneous Q4H   latanoprost   1 drop Both Eyes QHS   lidocaine  (PF)  20 mL Infiltration Once   linezolid   600 mg Per Tube Q12H   multivitamin with minerals  1 tablet Per Tube Daily   nutrition supplement (JUVEN)  1 packet Per Tube BID BM   mouth rinse  15 mL Mouth Rinse Q2H   pantoprazole  (PROTONIX ) IV  40 mg Intravenous Daily   QUEtiapine   25 mg Per Tube QHS   sertraline   25 mg Per Tube Daily   sodium chloride  flush  3 mL Intravenous Q12H   traZODone   100 mg Per Tube QHS   Continuous Infusions:  ampicillin -sulbactam (UNASYN ) IV Stopped (02/12/24 2157)   feeding supplement (VITAL 1.5 CAL) Stopped (02/12/24 1700)   PRN Meds:.acetaminophen  **OR** acetaminophen , bisacodyl , fentaNYL  (SUBLIMAZE ) injection, heparin , midazolam , nitroGLYCERIN , [DISCONTINUED] ondansetron  **OR** ondansetron  (ZOFRAN ) IV, mouth rinse, polyethylene glycol, sodium chloride , sodium chloride   flush  Current Labs: reviewed   Physical Exam:  Blood pressure (!) 147/69, pulse 68, temperature 98.6 F (37 C), temperature source Oral, resp. rate 20, height 6' 2 (1.88 m), weight 87.5 kg, SpO2 100%. Intubated and sedated Regular: No rub, normal S1-S2 Coarse breath sounds bilaterally Soft, nontender Lower extremity wounds bandaged Left femoral temporary HD catheter bandaged, clean and intact  A AKI on CKD 4 versus progression of CKD, starting HD during admission on 9/22, off CRRT 9/25; at this point he is likely ESRD PEA arrest 02/08/2024 x2; subsequent intermittent bradycardia episodes Urinary retention status post Foley and now on tamsulosin  Hypertension: Blood pressures have normalized after cardiac arrest Metabolic acidosis: Improved with dialysis Anemia, transfusion per CCM, on ESA Foot wounds and osteomyelitis of the fifth proximal phalanx of fifth metatarsal head on the right side on linezolid  and Unasyn  with orthopedics following Chronic DM2 Hypophosphatemia should improve now that CRRT is stopped  P Hemodialysis today in the ICU I was thinking that we could probably remove the femoral HD catheter anticipating placement of tunneled catheter early next week.  But given bradycardia I am not sure if he would be a candidate for tunneling until more stable.  Leave then and reassess tomorrow morning.   Once more stable can evaluate for AV fistula/graft as well. Medication Issues; Preferred narcotic agents for pain control are hydromorphone , fentanyl , and methadone. Morphine  should not be used.  Baclofen should be avoided Avoid oral  sodium phosphate  and magnesium  citrate based laxatives / bowel preps   Discussed with primary RN  Bernardino Gasman MD 02/13/2024, 8:53 AM  Recent Labs  Lab 02/12/24 1316 02/12/24 1841 02/12/24 1846 02/12/24 2022 02/13/24 0321 02/13/24 0459  NA 137  --    < > 138 140  138 141  K 4.5  --    < > 4.0 4.0  3.9 4.0  CL 104  --   --  102 104  102   --   CO2 20*  --   --  22 21*  21*  --   GLUCOSE 300*  --   --  246* 111*  109*  --   BUN 49*  --   --  52* 53*  54*  --   CREATININE 2.11*  --   --  2.18* 2.28*  2.20*  --   CALCIUM  7.3*  --   --  7.7* 7.8*  7.7*  --   PHOS 2.2* 2.3*  --   --  2.8  --    < > = values in this interval not displayed.   Recent Labs  Lab 02/11/24 0430 02/12/24 1841 02/12/24 1846 02/13/24 0321 02/13/24 0459  WBC 9.5 14.3*  --  18.4*  --   HGB 8.0* 9.2* 9.9* 9.3* 9.9*  HCT 23.7* 28.1* 29.0* 27.9* 29.0*  MCV 96.3 99.3  --  97.6  --   PLT 86* 89*  --  98*  --

## 2024-02-13 NOTE — Progress Notes (Addendum)
 Pt bradycardia on monitor to sustained rate of 37bpm for approximatly 60 seconds at 1012. Pt was noted to be vomiting yellow bile at same time estimate vomitus greater than . Inline suction of ETT retuned white tan secretions but no evidence of aspiration. Personal care, bed pad and gown changed.

## 2024-02-13 NOTE — Progress Notes (Addendum)
 Pharmacy rounds - recommending Ketamine   D/w RN and later examined patient   - no evidence of serotoni syndrome  - update daughter and wife at bedsie  Plan  - start ketamine  for sedation but rN will use it as appropriate       SIGNATURE    Dr. Dorethia Cave, M.D., F.C.C.P,  Pulmonary and Critical Care Medicine Staff Physician, [facility] Center Director - Interstitial Lung Disease  Program  Pulmonary Fibrosis [facility] at [facility] [place], [place], [address]   Pager: [phone], If no answer  -> Check AMION or Try [phone] Telephone (clinical office): [phone] Telephone (research): [phone]  2:38 PM 02/13/2024

## 2024-02-13 NOTE — Progress Notes (Signed)
 NAME:  David [name], MRN:  [id number], DOB:  08/04/1945, LOS: 10 ADMISSION DATE:  02/03/2024, CONSULTATION DATE:  02/08/2024 REFERRING MD:  Dr Noralee, CHIEF COMPLAINT:  PEA Arrest   History of Present Illness:  David [name] is a 78 year old gentleman with PMH of chronic combined systolic and diastolic heart failure (EF 30-35%), essential HTN, chronic hypoxic respiratory failure (on baseline 3L O2), CKD stage IV, CAD s/p , T2DM, chronic diabetic foot ulcers in bilateral lower extremities who presented to the hospital from SNF on 9/17 for worsening dyspnea for the previous 2 weeks. In the ED a CXR demonstrated pulmonary edema, BNP was elevated to 704, Cr was elevated to 5.01. He was admitted to [facility] for heart failure exacerbation.    Nephrology was consulted for AKI on CKD versus progression of CKD and ultimately decision was made to pursue HD for volume management. He went to IR on 9/22 for dialysis catheter placement. During advancement of the guidewire in the right internal jugular he became bradycardic and subsequently became unresponsive found to be in PEA arrest. CPR was started and procedure was aborted. He was intubated during the arrest. After 3 minutes of CPR, epi, calcium , and sodium bicarbonate  pushes ROSC was achieved. He was transferred to [facility] ICU for further management.   Pertinent  Medical History     has a past medical history of Anemia, Arthritis, CAD in native artery, CHF (congestive heart failure) (HCC), CKD (chronic kidney disease) stage 3, GFR 30-59 ml/min (HCC), DDD (degenerative disc disease), cervical, Diabetes mellitus with complication (HCC), Diabetic neuropathy (HCC), Diabetic retinopathy (HCC), GERD (gastroesophageal reflux disease), Glaucoma, and Hypertension.   has a past surgical history that includes Joint replacement; Cyst excision; Total hip arthroplasty; Back surgery; Amputation (Right, 07/09/2016); RIGHT/LEFT HEART CATH AND CORONARY ANGIOGRAPHY (N/A, 04/06/2017);  CORONARY STENT INTERVENTION (N/A, 04/06/2017); CORONARY STENT INTERVENTION (N/A, 04/07/2017); Cardiac catheterization; Laparoscopic abdominal exploration (N/A, 11/26/2017); Amputation (Right, 05/28/2018); Toe Surgery (05/2018); Endotracheal intubation emergent (08/07/2018); Amputation (Left, 08/03/2020); Esophagogastroduodenoscopy (Left, 11/07/2023); and IR TUNNELED CENTRAL VENOUS CATH PLC W IMG (02/08/2024).   Significant Hospital Events: Including procedures, antibiotic start and stop dates in addition to other pertinent events   9/17 Admitted to [facility] for acute on chronic CHF exacerbation and AKI on CKD stage IV 9/22 IR for dialysis catheter placement, complicated by bradycardia and subsequent PEA arrest while advancing guidewire. ROSC obtained after 3 minutes CPR. He was intubated during the arrest. 9/22 had a second PEA arrest 9/22 arterial line placed, HD catheter placed 9/24-remains on CRRT 02/11/24: No overnight events. 2 PEA arrest on 02/08/2024. Hemodynamics remained stable, responsive to verbal stimuli, not able to follow commands however was told that he was able to squeeze with his [name] earlier 02/12/24: Failed SBT in 2h but was on fent gtt at 25mcg at that time. Now on precedex  gtt and off fent gtt . Followed some commands from RN. NExt HD is tomorrow.  On TF. On also on chronic trazone, chronic zoloft , and new since yesterday klonopin  and seroquel . Precedex  dropping to HR 30. Also with diprivan . Got calcium  and phos repetin  Interim History / Subjective:    9/27 -  overnight brief brardycardia only . This am - vomitted and then brady per RN.Failed  SBT but that was 30 min after a fentanul PRN.  Did not tolerated precedex  and diprivan  gtt yesterday due to bradycadrdia. CXR wet with fluid. AWait HD  today  Objective    Blood pressure 118/67, pulse (!) 37, temperature  98.6 F (37 C), temperature source Oral, resp. rate (!) 30, height 6' 2 (1.88 m), weight 87.5 kg, SpO2 100%.    Vent Mode:  PRVC FiO2 (%):  [40 %] 40 % Set Rate:  [20 bmp-22 bmp] 20 bmp Vt Set:  [620 mL-650 mL] 620 mL PEEP:  [5 cmH20] 5 cmH20 Plateau Pressure:  [24 cmH20-33 cmH20] 28 cmH20   Intake/Output Summary (Last 24 hours) at 02/13/2024 1102 Last data filed at 02/13/2024 1015 Gross per 24 hour  Intake 1172.83 ml  Output 607 ml  Net 565.83 ml   Filed Weights   02/11/24 0403 02/12/24 0500 02/13/24 0357  Weight: 86.6 kg 87.5 kg 87.5 kg    Examination: . General Appearance:  Looks criticall ill Head:  Normocephalic, without obvious abnormality, atraumatic Eyes:  PERRL - yes, conjunctiva/corneas - muddyu     Ears:  Normal external ear canals, both ears Nose:  G tube - no Throat:  ETT TUBE - yes , OG tube - yes Neck:  Supple,  No enlargement/tenderness/nodules Lungs: Clear to auscultation bilaterally, Ventilator   Synchrony - yes Heart:  S1 and S2 normal, no murmur, CVP - no.  Pressors - no Abdomen:  Soft, no masses, no organomegaly Genitalia / Rectal:  Not done Extremities:  Extremities- intact Skin:  ntact in exposed areas . Sacral area - STAGE 2 per RN + Both feet in boots with OPEN [place] Neurologic:  Sedation - prn fent -> RASS - -3 . Moves all 4s - yes. CAM-ICU - x . Orientation - gets jumpy on palpatioin     Assessment and Plan   78 year old gentleman  BAseone: with systolic and diastolic heart failure, \came in with decompensation, acute kidney injury Had a PEA arrest x 2-successfully resuscitated  9/27: He is hemodynamically stable.    Plan  - MAP goal >65    Mixed shock - Decompensated heart failure, systolic and diastolic  - Off pressors 02/12/24  PLAN - Maintain MAP greater than 65  Pulmonary hypertension onset 2019 (absent 2018 on echo) and prgoressively worse  - - Secondary to group 2 disease  - repeated echo label it as SEVERE  Plan  - likely needs RHC to asess prognosis to get him off vent  Atrial fibrillation  9/26 and 9/27: in sinus  Plan - Continue  monitoring - Replete electrolytes  Acute kidney injury  9/27 - cxr with some volume overload  Plan  For HD 02/13/24  Acute hypoxemic respiratory failure  02/13/2024 - > does NOT meet criteria forExtubation in setting of Acute Respiratory Failure due - failed sbt - apenic but this was 30 min after fent push  Plan   - await improvements after HD 02/13/24  - PRN SEdation  - can try sbt gain 9/27 but no exutbation 02/13/24  - PRVC - VAP bundle  -Ventilator associated pneumonia prevention protocol  Sedation needs on ventilator - did no tolerated diprivan  and prcedex 02/12/24 due to bradyucardia S/p cardiac arrest PEA - at risk for congittivie issue  9/27: RASS -3 on prn fent  Plan  - prn fent - prn versed   - continue neuro protective measures - avoid meds that retain in system   - dc kolonopin scheduled  9/26  - dc'ed fent gtt 9/26 - do fent prn  - do versed  prn  - continue home zoloft  and desyrul - ok for seroquel  for agitgtion since 02/11/24   Progressive gangrenous changes both feet with wet gangrene of  lateral aspect of  the right foot, dry gangrene of the left heel, - seen by Dr  Dr  Harden 9/19  PLAN - Continue antibiotics - Recommendation is for amputation (surgely likely high risk due to critial illnes + PAH) - This plan is held at the present time pending stabilization  Anemia of chronic disease - - Did receive 1 unit PRBC on 01/09/2024  Plan  - - PRBC for hgb </= 6.9gm%    - exceptions are   -  if ACS susepcted/confirmed then transfuse for hgb </= 8.0gm%,  or    -  active bleeding with hemodynamic instability, then transfuse regardless of hemoglobin value   At at all times try to transfuse 1 unit prbc as possible with exception of active hemorrhage   Type 2 diabetes  - Continue SSI  Prognosis is guarded Patient is DNR,    PLAN  - continue current measures   VOMIT 02/13/24 and 02/12/24 with Bradycardia  - has reative bradycardia  Plan Change zofran   to compazine  prn  PCCM    - 02/13/24:    Best practice:  Diet: TF (keep vomituing Pain/Anxiety/Delirium protocol (if indicated): prn VAP protocol (if indicated): yes DVT prophylaxis: heparin  GI prophylaxis: ppi Glucose control: ssi Mobility: bed rest Code Status: DNR if arrests Disposition: ICU  FAMILYU - did update patient's daughter at bedside 02/10/2024 and - Dr Geronimo:  in 02/12/24 over phone (daugther Chlore Jacome said wife has dementia) [phone] [facility] but visiting) - wants daily updates  -02/13/24 - called dauther. Did not leave message. No  pickup     ATTESTATION & SIGNATURE   The patient David [name] is critically ill with multiple organ systems failure and requires high complexity decision making for assessment and support, frequent evaluation and titration of therapies, application of advanced monitoring technologies and extensive interpretation of multiple databases and discussion with other appropriate health care personnel such as bedside nurses, social workers, case Production designer, theatre/television/film, consultants, respiratory therapists, nutritionists, secretaries etc.,  Critical care time includes but is not restricted to just documentation time. Documentation can happen in parallel or sequential to care time depending on case mix urgency and priorities for the shift. So, overall critical Care Time devoted to patient care services described in this note is  35  Minutes.   This time reflects time of care of this signee Dr Dorethia Geronimo which includ does not reflect procedure time, or teaching time or supervisory time of PA/NP/Med student/Med Resident etc but could involve care discussion time     Dr. Dorethia Geronimo, M.D., [facility]C.P Pulmonary and Critical Care Medicine Staff Physician, [facility] [place] [facility] Pager: [phone], If no answer or between  15:00h - 7:00h: call [phone]  02/13/2024 11:27 AM     LABS     PULMONARY Recent Labs  Lab 02/08/24 1446 02/12/24 1846 02/13/24 0459  PHART 7.352 7.483* 7.545*  PCO2ART 29.9* 32.6 27.7*  PO2ART 338* 310* 151*  HCO3 17.0* 24.3 24.0  TCO2 18* 25 25  O2SAT 100 100 100    CBC Recent Labs  Lab 02/11/24 0430 02/12/24 1841 02/12/24 1846 02/13/24 0321 02/13/24 0459  HGB 8.0* 9.2* 9.9* 9.3* 9.9*  HCT 23.7* 28.1* 29.0* 27.9* 29.0*  WBC 9.5 14.3*  --  18.4*  --   PLT 86* 89*  --  98*  --     COAGULATION No results for input(s): INR in the last 168 hours.  CARDIAC  No  results for input(s): TROPONINI in the last 168 hours. No results for input(s): PROBNP in the last 168 hours.   CHEMISTRY Recent Labs  Lab 02/10/24 0309 02/10/24 1718 02/11/24 0430 02/11/24 1514 02/12/24 0354 02/12/24 1316 02/12/24 1841 02/12/24 1846 02/12/24 2022 02/13/24 0321 02/13/24 0459  NA 138   < > 135 134* 135 137  --  139 138 140  138 141  K 3.6   < > 4.2 4.2 4.3 4.5  --  4.3 4.0 4.0  3.9 4.0  CL 106   < > 103 101 102 104  --   --  102 104  102  --   CO2 20*   < > 21* 21* 21* 20*  --   --  22 21*  21*  --   GLUCOSE 169*   < > 165* 151* 257* 300*  --   --  246* 111*  109*  --   BUN 40*   < > 30* 27* 40* 49*  --   --  52* 53*  54*  --   CREATININE 2.25*   < > 1.47* 1.27* 1.80* 2.11*  --   --  2.18* 2.28*  2.20*  --   CALCIUM  7.6*   < > 7.6* 7.5* 7.4* 7.3*  --   --  7.7* 7.8*  7.7*  --   MG 2.5*  --  2.4  --  2.5*  --  2.5*  --   --  2.6*  --   PHOS 1.8*   < > 1.5* 3.2 2.2* 2.2* 2.3*  --   --  2.8  --    < > = values in this interval not displayed.   Estimated Creatinine Clearance: 33.2 mL/min (A) (by C-G formula based on SCr of 2.2 mg/dL (H)).   LIVER Recent Labs  Lab 02/08/24 1513 02/09/24 0437 02/11/24 1514 02/12/24 0354 02/12/24 1316 02/12/24 2022 02/13/24 0321  AST 31  --   --   --   --  112* 139*  ALT 19  --   --   --   --  39 47*  ALKPHOS 102  --   --   --   --  260* 278*  BILITOT 1.2  --   --   --   --  0.8 1.3*   PROT 7.7  --   --   --   --  7.4 7.7  ALBUMIN  2.3*   < > 2.0* 1.9* 2.0* 2.0* 2.0*  2.0*   < > = values in this interval not displayed.     INFECTIOUS Recent Labs  Lab 02/08/24 1513 02/12/24 1853  LATICACIDVEN 1.1 1.7     ENDOCRINE CBG (last 3)  Recent Labs    02/12/24 2324 02/13/24 0301 02/13/24 0723  GLUCAP 158* 109* 87         IMAGING x48h  - image(s) personally visualized  -   highlighted in bold DG CHEST PORT 1 VIEW Result Date: 02/13/2024 CLINICAL DATA:  Acute on chronic respiratory failure. EXAM: PORTABLE CHEST 1 VIEW COMPARISON:  02/12/2024 FINDINGS: Feeding tube extending into the stomach with its tip not included. Endotracheal tube in satisfactory position. Mildly enlarged cardiac silhouette without significant change. Stable small to moderate-sized right pleural effusion and mild right basilar atelectasis. Clear left lung. Coronary artery stent. Thoracic spine degenerative changes. IMPRESSION: 1. Stable small to moderate-sized right pleural effusion and mild right basilar atelectasis. 2. Stable mild cardiomegaly. Electronically Signed   By: Elspeth Bathe  M.D.   On: 02/13/2024 10:39   DG Abd 1 View Result Date: 02/12/2024 EXAM: 1 VIEW XRAY OF THE ABDOMEN 02/12/2024 06:58:00 PM COMPARISON: 02/08/2024 CLINICAL HISTORY: Vomiting [id number]. vomiting FINDINGS: LINES, TUBES AND DEVICES: Enteric tube in place with distal tip terminating within the expected location of the gastric antrum. BOWEL: Gaseous distension of the stomach. Nonobstructive bowel gas pattern. SOFT TISSUES: No opaque urinary calculi. BONES: No acute osseous abnormality. IMPRESSION: 1. Enteric tube appropriately positioned with tip in the gastric antrum. Electronically signed by: Norman Gatlin MD 02/12/2024 07:15 PM EDT RP Workstation: [id number]

## 2024-02-14 ENCOUNTER — Inpatient Hospital Stay (HOSPITAL_COMMUNITY)

## 2024-02-14 DIAGNOSIS — I5033 Acute on chronic diastolic (congestive) heart failure: Secondary | ICD-10-CM | POA: Diagnosis not present

## 2024-02-14 DIAGNOSIS — I469 Cardiac arrest, cause unspecified: Secondary | ICD-10-CM | POA: Diagnosis not present

## 2024-02-14 DIAGNOSIS — N179 Acute kidney failure, unspecified: Secondary | ICD-10-CM | POA: Diagnosis not present

## 2024-02-14 DIAGNOSIS — J9601 Acute respiratory failure with hypoxia: Secondary | ICD-10-CM | POA: Diagnosis not present

## 2024-02-14 LAB — GLUCOSE, CAPILLARY
Glucose-Capillary: 108 mg/dL — ABNORMAL HIGH (ref 70–99)
Glucose-Capillary: 113 mg/dL — ABNORMAL HIGH (ref 70–99)
Glucose-Capillary: 114 mg/dL — ABNORMAL HIGH (ref 70–99)
Glucose-Capillary: 126 mg/dL — ABNORMAL HIGH (ref 70–99)
Glucose-Capillary: 132 mg/dL — ABNORMAL HIGH (ref 70–99)
Glucose-Capillary: 139 mg/dL — ABNORMAL HIGH (ref 70–99)
Glucose-Capillary: 149 mg/dL — ABNORMAL HIGH (ref 70–99)

## 2024-02-14 LAB — RENAL FUNCTION PANEL
Albumin: 1.9 g/dL — ABNORMAL LOW (ref 3.5–5.0)
Albumin: 1.8 g/dL — ABNORMAL LOW (ref 3.5–5.0)
Anion gap: 13 (ref 5–15)
Anion gap: 13 (ref 5–15)
BUN: 41 mg/dL — ABNORMAL HIGH (ref 8–23)
BUN: 48 mg/dL — ABNORMAL HIGH (ref 8–23)
CO2: 24 mmol/L (ref 22–32)
CO2: 23 mmol/L (ref 22–32)
Calcium: 7.9 mg/dL — ABNORMAL LOW (ref 8.9–10.3)
Calcium: 7.9 mg/dL — ABNORMAL LOW (ref 8.9–10.3)
Chloride: 99 mmol/L (ref 98–111)
Chloride: 100 mmol/L (ref 98–111)
Creatinine, Ser: 2.14 mg/dL — ABNORMAL HIGH (ref 0.61–1.24)
Creatinine, Ser: 2.94 mg/dL — ABNORMAL HIGH (ref 0.61–1.24)
GFR, Estimated: 31 mL/min — ABNORMAL LOW (ref >60)
GFR, Estimated: 21 mL/min — ABNORMAL LOW (ref >60)
Glucose, Bld: 127 mg/dL — ABNORMAL HIGH (ref 70–99)
Glucose, Bld: 117 mg/dL — ABNORMAL HIGH (ref 70–99)
Phosphorus: 3.2 mg/dL (ref 2.5–4.6)
Phosphorus: 3.9 mg/dL (ref 2.5–4.6)
Potassium: 4.1 mmol/L (ref 3.5–5.1)
Potassium: 4.3 mmol/L (ref 3.5–5.1)
Sodium: 136 mmol/L (ref 135–145)
Sodium: 136 mmol/L (ref 135–145)

## 2024-02-14 LAB — MAGNESIUM: Magnesium: 2.1 mg/dL (ref 1.7–2.4)

## 2024-02-14 LAB — COMPREHENSIVE METABOLIC PANEL WITH GFR
ALT: 63 U/L — ABNORMAL HIGH (ref 0–44)
AST: 139 U/L — ABNORMAL HIGH (ref 15–41)
Albumin: 1.8 g/dL — ABNORMAL LOW (ref 3.5–5.0)
Alkaline Phosphatase: 290 U/L — ABNORMAL HIGH (ref 38–126)
Anion gap: 10 (ref 5–15)
BUN: 40 mg/dL — ABNORMAL HIGH (ref 8–23)
CO2: 24 mmol/L (ref 22–32)
Calcium: 7.8 mg/dL — ABNORMAL LOW (ref 8.9–10.3)
Chloride: 102 mmol/L (ref 98–111)
Creatinine, Ser: 2.29 mg/dL — ABNORMAL HIGH (ref 0.61–1.24)
GFR, Estimated: 29 mL/min — ABNORMAL LOW (ref >60)
Glucose, Bld: 124 mg/dL — ABNORMAL HIGH (ref 70–99)
Potassium: 4.1 mmol/L (ref 3.5–5.1)
Sodium: 136 mmol/L (ref 135–145)
Total Bilirubin: 1.7 mg/dL — ABNORMAL HIGH (ref 0.0–1.2)
Total Protein: 7.5 g/dL (ref 6.5–8.1)

## 2024-02-14 LAB — CBC
HCT: 29.7 % — ABNORMAL LOW (ref 39.0–52.0)
Hemoglobin: 9.7 g/dL — ABNORMAL LOW (ref 13.0–17.0)
MCH: 32.4 pg (ref 26.0–34.0)
MCHC: 32.7 g/dL (ref 30.0–36.0)
MCV: 99.3 fL (ref 80.0–100.0)
Platelets: 118 K/uL — ABNORMAL LOW (ref 150–400)
RBC: 2.99 MIL/uL — ABNORMAL LOW (ref 4.22–5.81)
RDW: 26.8 % — ABNORMAL HIGH (ref 11.5–15.5)
WBC: 25 K/uL — ABNORMAL HIGH (ref 4.0–10.5)
nRBC: 0.1 % (ref 0.0–0.2)

## 2024-02-14 LAB — CK TOTAL AND CKMB (NOT AT ARMC)
CK, MB: 2.4 ng/mL (ref 0.5–5.0)
Total CK: 449 U/L — ABNORMAL HIGH (ref 49–397)

## 2024-02-14 MED ORDER — SCOPOLAMINE 1 MG/3DAYS TD PT72
1.0000 | MEDICATED_PATCH | TRANSDERMAL | Status: DC
  Administered 2024-02-14: 1 mg via TRANSDERMAL
  Filled 2024-02-14: qty 1

## 2024-02-14 NOTE — Progress Notes (Signed)
 ORal secrfetions post extubation  Plan Scopalamine patch    SIGNATURE    Dr. Dorethia Cave, M.D., F.C.C.P,  Pulmonary and Critical Care Medicine Staff Physician, [facility] Center Director - Interstitial Lung Disease  Program  Pulmonary Fibrosis [facility] at [facility] [place], [place], [address]   Pager: [phone], If no answer  -> Check AMION or Try [phone] Telephone (clinical office): [phone] Telephone (research): [phone]  3:00 PM 02/14/2024

## 2024-02-14 NOTE — Plan of Care (Signed)
  Problem: Clinical Measurements: Goal: Ability to maintain clinical measurements within normal limits will improve Outcome: Progressing Goal: Will remain free from infection Outcome: Progressing Goal: Diagnostic test results will improve Outcome: Progressing Goal: Respiratory complications will improve Outcome: Progressing Goal: Cardiovascular complication will be avoided Outcome: Progressing   Problem: Activity: Goal: Risk for activity intolerance will decrease Outcome: Progressing   Problem: Nutrition: Goal: Adequate nutrition will be maintained Outcome: Progressing   Problem: Coping: Goal: Level of anxiety will decrease Outcome: Progressing   Problem: Pain Managment: Goal: General experience of comfort will improve and/or be controlled Outcome: Progressing   Problem: Safety: Goal: Ability to remain free from injury will improve Outcome: Progressing   Problem: Skin Integrity: Goal: Risk for impaired skin integrity will decrease Outcome: Progressing   Problem: Cardiac: Goal: Ability to achieve and maintain adequate cardiopulmonary perfusion will improve Outcome: Progressing   Problem: Coping: Goal: Ability to adjust to condition or change in health will improve Outcome: Progressing   Problem: Tissue Perfusion: Goal: Adequacy of tissue perfusion will improve Outcome: Progressing   Problem: Activity: Goal: Ability to tolerate increased activity will improve Outcome: Progressing   Problem: Respiratory: Goal: Ability to maintain a clear airway and adequate ventilation will improve Outcome: Progressing   Problem: Role Relationship: Goal: Method of communication will improve Outcome: Progressing   Problem: Safety: Goal: Non-violent Restraint(s) Outcome: Progressing

## 2024-02-14 NOTE — Procedures (Signed)
 Extubation Procedure Note  Patient Details:   Name: David [name] DOB: Nov 11, 1945 MRN: [id number]   Airway Documentation:    Vent end date: 02/14/24 Vent end time: 1235   Evaluation  O2 sats: stable throughout Complications: No apparent complications Patient did tolerate procedure well. Bilateral Breath Sounds: Diminished, Rhonchi   Yes   Patient was extubated per provider order to 3L [place]. Patient had positive cuff leak, no stridor noted. No other complications.RT will continue to monitor.   Damien JULIANNA Pae 02/14/2024, 12:46 PM

## 2024-02-14 NOTE — Progress Notes (Signed)
 Pt not managing secretions at back of oropharynx after extubation. Was able to suction some out with yankauer. Lethargic and not at baseline per family. High risk of aspiration with recent history of vomiting after administration of medications and dietary supplements via feeding tube.   Avoiding fentanyl  secondary to borderline BP and lethargy. Pt yelling out and admits to pain but is unable to specify or quantify. Acetaminophen  given per tube with some relief and decrease in calling out.  Restarted vital 1.5 at 40ml/hr (trickle feed) which has been held for over 36 hours because of nausea and vomiting. Last vomiting episode yesterday when compazine  started for N/V. Giving compazine  5 mg IV q6h PRN with success to counter N/V

## 2024-02-14 NOTE — Progress Notes (Signed)
 NAME:  David [name], MRN:  [id number], DOB:  Mar 23, 1946, LOS: 11 ADMISSION DATE:  02/03/2024, CONSULTATION DATE:  02/08/2024 REFERRING MD:  David Noralee, CHIEF COMPLAINT:  PEA Arrest   History of Present Illness:  David [name] is a 78 year old gentleman with PMH of chronic combined systolic and diastolic heart failure (EF 30-35%), essential HTN, chronic hypoxic respiratory failure (on baseline 3L O2), CKD stage IV, CAD s/p , T2DM, chronic diabetic foot ulcers in bilateral lower extremities who presented to the hospital from [facility]F on 9/17 for worsening dyspnea for the previous 2 weeks. In the ED a CXR demonstrated pulmonary edema, BNP was elevated to 704, Cr was elevated to 5.01. He was admitted to [facility] for heart failure exacerbation.    Nephrology was consulted for AKI on CKD versus progression of CKD and ultimately decision was made to pursue HD for volume management. He went to IR on 9/22 for dialysis catheter placement. During advancement of the guidewire in the right internal jugular he became bradycardic and subsequently became unresponsive found to be in PEA arrest. CPR was started and procedure was aborted. He was intubated during the arrest. After 3 minutes of CPR, epi, calcium , and sodium bicarbonate  pushes ROSC was achieved. He was transferred to [facility] ICU for further management.   Pertinent  Medical History     has a past medical history of Anemia, Arthritis, CAD in native artery, CHF (congestive heart failure) (HCC), CKD (chronic kidney disease) stage 3, GFR 30-59 ml/min (HCC), DDD (degenerative disc disease), cervical, Diabetes mellitus with complication (HCC), Diabetic neuropathy (HCC), Diabetic retinopathy (HCC), GERD (gastroesophageal reflux disease), Glaucoma, and Hypertension.   has a past surgical history that includes Joint replacement; Cyst excision; Total hip arthroplasty; Back surgery; Amputation (Right, 07/09/2016); RIGHT/LEFT HEART CATH AND CORONARY ANGIOGRAPHY (N/A, 04/06/2017);  CORONARY STENT INTERVENTION (N/A, 04/06/2017); CORONARY STENT INTERVENTION (N/A, 04/07/2017); Cardiac catheterization; Laparoscopic abdominal exploration (N/A, 11/26/2017); Amputation (Right, 05/28/2018); Toe Surgery (05/2018); Endotracheal intubation emergent (08/07/2018); Amputation (Left, 08/03/2020); Esophagogastroduodenoscopy (Left, 11/07/2023); and IR TUNNELED CENTRAL VENOUS CATH PLC W IMG (02/08/2024).   Significant Hospital Events: Including procedures, antibiotic start and stop dates in addition to other pertinent events   9/17 Admitted to [facility] for acute on chronic CHF exacerbation and AKI on CKD stage IV 9/22 IR for dialysis catheter placement, complicated by bradycardia and subsequent PEA arrest while advancing guidewire. ROSC obtained after 3 minutes CPR. He was intubated during the arrest. 9/22 had a second PEA arrest 9/22 arterial line placed, LEF FEMORAL HD catheter placed 9/24-remains on CRRT 02/11/24: No overnight events. 2 PEA arrest on 02/08/2024. Hemodynamics remained stable, responsive to verbal stimuli, not able to follow commands however was told that he was able to squeeze with his hands earlier 02/12/24: Failed SBT in 2h but was on fent gtt at 25mcg at that time. Now on precedex  gtt and off fent gtt . Followed some commands from RN. NExt HD is tomorrow.  On TF. On also on chronic trazone, chronic zoloft , and new since yesterday klonopin  and seroquel . Precedex  dropping to HR 30. Also with diprivan . Got calcium  and phos repetin 9/27 -  overnight brief brardycardia only . This am - vomitted and then brady per RN.Failed  SBT but that was 30 min after a fentanul PRN.  Did not tolerated precedex  and diprivan  gtt yesterday due to bradycadrdia. CXR wet with fluid. HD with 3L Ultrasfiltration  Interim History / Subjective:   9/28 - REnmal plans 3 more Litersl 9/.29/25. HAs PAH since  2019 - needs RHC./ On Vent . Fio2 40%. AFebrile. WBC rising .  CXr despite HD yesterday is worse with Rt side  fluid v intilrates versus both. Been weaning all night. More awake. Followed some simple commands. No more bradycardia. Did have vomiting yesterday -> better/resolved with compazine . NEVER needed ketamine    Objective    Blood pressure 129/62, pulse 86, temperature 98.7 F (37.1 C), resp. rate 17, height 6' 2 (1.88 m), weight 82.5 kg, SpO2 95%.    Vent Mode: PSV;CPAP FiO2 (%):  [40 %] 40 % PEEP:  [5 cmH20] 5 cmH20 Pressure Support:  [10 cmH20] 10 cmH20 Plateau Pressure:  [21 cmH20] 21 cmH20   Intake/Output Summary (Last 24 hours) at 02/14/2024 1227 Last data filed at 02/14/2024 0700 Gross per 24 hour  Intake 100 ml  Output 3110 ml  Net -3010 ml   Filed Weights   02/13/24 1534 02/13/24 1915 02/14/24 0256  Weight: 87.5 kg 84.5 kg 82.5 kg    Examination: . General Appearance:  Looks criticall ill bu better Head:  Normocephalic, without obvious abnormality, atraumatic Eyes:  PERRL - yes, conjunctiva/corneas - mudd     Ears:  Normal external ear canals, both ears Nose:  G tube - yes Throat:  ETT TUBE - yes , OG tube - no Neck:  Supple,  No enlargement/tenderness/nodules Lungs: Clear to auscultation bilaterally, Ventilator   Synchrony - yes on PSV Heart:  S1 and S2 normal, no murmur, CVP - no.  Pressors - no Abdomen:  Soft, no masses, no organomegaly Genitalia / Rectal:  Not done Extremities:  Extremities- intact Skin:  ntact in exposed areas . Sacral area - not examined Neurologic:  Sedation - prin -> RASS - -1 . Moves all 4s - yes. CAM-ICU - cannot test . Orientation - followed som simple commands     Assessment and Plan   78 year old gentleman  BAseline: with systolic and diastolic heart failure, \came in with decompensation, acute kidney injury Had a PEA arrest x 2-successfully resuscitated  9/27 and 9/28: He is hemodynamically stable.    Plan  - MAP goal >65   Mixed shock - Decompensated heart failure, systolic and diastolic  - Off pressors 02/12/24  PLAN -  Maintain MAP greater than 65  Pulmonary hypertension onset 2019 (absent 2018 on echo) and prgoressively worse  - - Secondary to group 2 disease  - repeated echo label it as SEVERE >  Plan  - needs RHC -> to call cards 02/15/24  Atrial fibrillation  9/26 and 9/27: and 9/28 in sinus  Plan - Continue monitoring - Replete electrolytes  Acute kidney injury - CXR 9/27 and 9.28with volume overload Left femoral HD cath + -   - s/pHD with 3L UL on 02/13/24  Plan  For HD 02/15/24 RHC will indicate volume status better and understand cardiac risk for another central HD cath better For now keep left femoral HD cath  Acute hypoxemic respiratory failure. On vent since cardiac arrest  02/14/2024 - > meets exubaion criteria but cxr stil wet   Plan   - Extubate an d monitor - trach aspirate 9/28 before extubation - VAP bundle  Sedation needs on ventilator - did no tolerated diprivan  and prcedex 02/12/24 due to bradyucardia S/p cardiac arrest PEA - at risk for congittivie issue  9/28 - off alll sedation  Plan  - dc prn fent - dc prn versed  - dc seroqel  - continue neuro protective measures   - continue home  zoloft  and desyrul   Progressive gangrenous changes both feet with wet gangrene of  lateral aspect of the right foot, dry gangrene of the left heel, - seen by David  David [name] 9/19  PLAN - Continue antibiotics - Recommendation is for amputation (surgely likely high risk due to critial illnes + PAH) - Needs RHC before any surgery - This plan is held at the present time pending stabilization  Anemia of chronic disease - - Did receive 1 unit PRBC on 01/09/2024  Plan  - - PRBC for hgb </= 6.9gm%    - exceptions are   -  if ACS susepcted/confirmed then transfuse for hgb </= 8.0gm%,  or    -  active bleeding with hemodynamic instability, then transfuse regardless of hemoglobin value   At at all times try to transfuse 1 unit prbc as possible with exception of active  hemorrhage   Type 2 diabetes  - Continue SSI  Prognosis is guarded Patient is DNR,    PLAN  - continue current measures   VOMIT 02/13/24 and 02/12/24 with Bradycardia  -no Vomit > no bradycardia 02/14/24  Plan Change zofran  to compazine  prn     Best practice:  Diet: TF (keep vomituing Pain/Anxiety/Delirium protocol (if indicated): prn VAP protocol (if indicated): yes DVT prophylaxis: heparin  GI prophylaxis: ppi Glucose control: ssi Mobility: bed rest Code Status: DNR if arrests Disposition: ICU  FAMILYU - did update patient's daughter at bedside 02/10/2024 and - David Geronimo:  in 02/12/24 over phone (daugther Chlore Bibby said wife has dementia) [phone] [facility] but visiting) - wants daily updates  -02/13/24 - called dauther. Did not leave message. No  pickup - > later upated at bedside. Daughter and wife  - 02/14/24: LMTCB daughter      ATTESTATION & SIGNATURE   The patient David [name] is critically ill with multiple organ systems failure and requires high complexity decision making for assessment and support, frequent evaluation and titration of therapies, application of advanced monitoring technologies and extensive interpretation of multiple databases and discussion with other appropriate health care personnel such as bedside nurses, social workers, case Production designer, theatre/television/film, consultants, respiratory therapists, nutritionists, secretaries etc.,  Critical care time includes but is not restricted to just documentation time. Documentation can happen in parallel or sequential to care time depending on case mix urgency and priorities for the shift. So, overall critical Care Time devoted to patient care services described in this note is  35  Minutes.   This time reflects time of care of this signee David Dorethia Geronimo which includ does not reflect procedure time, or teaching time or supervisory time of PA/NP/Med student/Med Resident etc but could involve care discussion  time     David. Dorethia Geronimo, M.D., [facility].C.P Pulmonary and Critical Care Medicine Staff Physician, [facility] [place] [facility] Pager: [phone], If no answer or between  15:00h - 7:00h: call [phone]  02/14/2024 12:46 PM     LABS    PULMONARY Recent Labs  Lab 02/08/24 1446 02/12/24 1846 02/13/24 0459 02/13/24 1300  PHART 7.352 7.483* 7.545*  --   PCO2ART 29.9* 32.6 27.7*  --   PO2ART 338* 310* 151*  --   HCO3 17.0* 24.3 24.0  --   TCO2 18* 25 25  --   O2SAT 100 100 100 81.7    CBC Recent Labs  Lab 02/12/24 1841 02/12/24 1846 02/13/24 0321 02/13/24 0459 02/14/24 0431  HGB 9.2*   < > 9.3* 9.9* 9.7*  HCT 28.1*   < > 27.9* 29.0* 29.7*  WBC 14.3*  --  18.4*  --  25.0*  PLT 89*  --  98*  --  118*   < > = values in this interval not displayed.    COAGULATION No results for input(s): INR in the last 168 hours.  CARDIAC  No results for input(s): TROPONINI in the last 168 hours. No results for input(s): PROBNP in the last 168 hours.   CHEMISTRY Recent Labs  Lab 02/11/24 0430 02/11/24 1514 02/12/24 0354 02/12/24 1316 02/12/24 1841 02/12/24 1846 02/12/24 2022 02/13/24 0321 02/13/24 0459 02/13/24 1900 02/14/24 0431  NA 135   < > 135 137  --    < > 138 140  138 141 136 136  136  K 4.2   < > 4.3 4.5  --    < > 4.0 4.0  3.9 4.0 3.4* 4.1  4.1  CL 103   < > 102 104  --   --  102 104  102  --  95* 102  99  CO2 21*   < > 21* 20*  --   --  22 21*  21*  --  26 24  24   GLUCOSE 165*   < > 257* 300*  --   --  246* 111*  109*  --  95 124*  127*  BUN 30*   < > 40* 49*  --   --  52* 53*  54*  --  19 40*  41*  CREATININE 1.47*   < > 1.80* 2.11*  --   --  2.18* 2.28*  2.20*  --  1.17 2.29*  2.14*  CALCIUM  7.6*   < > 7.4* 7.3*  --   --  7.7* 7.8*  7.7*  --  8.7* 7.8*  7.9*  MG 2.4  --  2.5*  --  2.5*  --   --  2.6*  --   --  2.1  PHOS 1.5*   < > 2.2* 2.2* 2.3*  --   --  2.8  --  1.5* 3.2   < > = values in this  interval not displayed.   Estimated Creatinine Clearance: 34.1 mL/min (A) (by C-G formula based on SCr of 2.14 mg/dL (H)).   LIVER Recent Labs  Lab 02/08/24 1513 02/09/24 0437 02/12/24 1316 02/12/24 2022 02/13/24 0321 02/13/24 1900 02/14/24 0431  AST 31  --   --  112* 139*  --  139*  ALT 19  --   --  39 47*  --  63*  ALKPHOS 102  --   --  260* 278*  --  290*  BILITOT 1.2  --   --  0.8 1.3*  --  1.7*  PROT 7.7  --   --  7.4 7.7  --  7.5  ALBUMIN  2.3*   < > 2.0* 2.0* 2.0*  2.0* 2.5* 1.8*  1.9*   < > = values in this interval not displayed.     INFECTIOUS Recent Labs  Lab 02/08/24 1513 02/12/24 1853  LATICACIDVEN 1.1 1.7     ENDOCRINE CBG (last 3)  Recent Labs    02/14/24 0436 02/14/24 0744 02/14/24 1105  GLUCAP 132* 126* 139*         IMAGING x48h  - image(s) personally visualized  -   highlighted in bold DG CHEST PORT 1 VIEW Result Date: 02/14/2024 EXAM: 1 VIEW(S) XRAY  OF THE CHEST 02/14/2024 05:52:00 AM COMPARISON: 02/13/2024 CLINICAL HISTORY: Endotracheal tube present; Best images obtainable due to patient's condition FINDINGS: LINES, TUBES AND DEVICES: Endotracheal tube in place with tip 5.9 cm above the carina. Enteric tube in place extending below the diaphragm, distal tip beyond the inferior margin of the film. LUNGS AND PLEURA: Similar layering right pleural effusion. Increased hazy opacities throughout the right lung. Increased left basilar atelectasis. No pneumothorax. HEART AND MEDIASTINUM: Aortic atherosclerosis. No acute abnormality of the cardiac and mediastinal silhouettes. BONES AND SOFT TISSUES: No acute osseous abnormality. IMPRESSION: 1. Endotracheal and enteric tubes appropriately positioned. 2. Increased hazy opacities throughout the right lung, which may reflect worsening infection or edema in the appropriate clinical context. 3. Similar layering right pleural effusion. 4. Increased left basilar atelectasis. 5. Aortic atherosclerosis.  Electronically signed by: Waddell Calk MD 02/14/2024 07:16 AM EDT RP Workstation: [id number]   DG CHEST PORT 1 VIEW Result Date: 02/13/2024 CLINICAL DATA:  Acute on chronic respiratory failure. EXAM: PORTABLE CHEST 1 VIEW COMPARISON:  02/12/2024 FINDINGS: Feeding tube extending into the stomach with its tip not included. Endotracheal tube in satisfactory position. Mildly enlarged cardiac silhouette without significant change. Stable small to moderate-sized right pleural effusion and mild right basilar atelectasis. Clear left lung. Coronary artery stent. Thoracic spine degenerative changes. IMPRESSION: 1. Stable small to moderate-sized right pleural effusion and mild right basilar atelectasis. 2. Stable mild cardiomegaly. Electronically Signed   By: Elspeth Bathe M.D.   On: 02/13/2024 10:39   DG Abd 1 View Result Date: 02/12/2024 EXAM: 1 VIEW XRAY OF THE ABDOMEN 02/12/2024 06:58:00 PM COMPARISON: 02/08/2024 CLINICAL HISTORY: Vomiting [id number]. vomiting FINDINGS: LINES, TUBES AND DEVICES: Enteric tube in place with distal tip terminating within the expected location of the gastric antrum. BOWEL: Gaseous distension of the stomach. Nonobstructive bowel gas pattern. SOFT TISSUES: No opaque urinary calculi. BONES: No acute osseous abnormality. IMPRESSION: 1. Enteric tube appropriately positioned with tip in the gastric antrum. Electronically signed by: Norman Gatlin MD 02/12/2024 07:15 PM EDT RP Workstation: [id number]

## 2024-02-14 NOTE — Progress Notes (Signed)
 Admit: 02/03/2024 LOS: 11  70M with AKI on CKD4 vs progressive CKD; Cardiac arrest x2 02/08/24   Subjective:  Interval events, will be undergoing right heart catheterization to evaluate severe pulmonary hypertension Dialysis yesterday with 3 L ultrafiltration K4.1, bicarbonate 24 4 0.4 L urine output yesterday, stable Has left femoral temporary HD catheter Remains on ventilator  09/27 0701 - 09/28 0700 In: 230 [I.V.:30; IV Piggyback:200] Out: 3665 [Urine:385; Emesis/NG output:280]  Filed Weights   02/13/24 1534 02/13/24 1915 02/14/24 0256  Weight: 87.5 kg 84.5 kg 82.5 kg    Scheduled Meds:  aspirin   81 mg Per Tube Daily   atorvastatin   80 mg Per Tube QHS   Chlorhexidine  Gluconate Cloth  6 each Topical Daily   Chlorhexidine  Gluconate Cloth  6 each Topical Q0600   Chlorhexidine  Gluconate Cloth  6 each Topical Q0600   darbepoetin (ARANESP ) injection - DIALYSIS  60 mcg Subcutaneous Q Thu-1800   docusate  100 mg Per Tube BID   feeding supplement (PROSource TF20)  60 mL Per Tube BID   heparin   5,000 Units Subcutaneous Q8H   insulin  aspart  0-20 Units Subcutaneous Q4H   latanoprost   1 drop Both Eyes QHS   linezolid   600 mg Per Tube Q12H   multivitamin with minerals  1 tablet Per Tube Daily   nutrition supplement (JUVEN)  1 packet Per Tube BID BM   mouth rinse  15 mL Mouth Rinse Q2H   pantoprazole  (PROTONIX ) IV  40 mg Intravenous Daily   QUEtiapine   25 mg Per Tube QHS   sertraline   25 mg Per Tube Daily   sodium chloride  flush  3 mL Intravenous Q12H   traZODone   100 mg Per Tube QHS   Continuous Infusions:  ampicillin -sulbactam (UNASYN ) IV 3 g (02/14/24 0950)   anticoagulant sodium citrate     anticoagulant sodium citrate     feeding supplement (VITAL 1.5 CAL) Stopped (02/12/24 1700)   ketamine  (KETALAR ) adult infusion     PRN Meds:.acetaminophen  **OR** acetaminophen , alteplase, alteplase, anticoagulant sodium citrate, anticoagulant sodium citrate, bisacodyl , fentaNYL   (SUBLIMAZE ) injection, heparin , heparin , heparin , lidocaine  (PF), lidocaine -prilocaine, midazolam , mouth rinse, pentafluoroprop-tetrafluoroeth, polyethylene glycol, prochlorperazine , sodium chloride , sodium chloride  flush  Current Labs: reviewed   Physical Exam:  Blood pressure 137/74, pulse 77, temperature 97.7 F (36.5 C), temperature source Axillary, resp. rate 14, height 6' 2 (1.88 m), weight 82.5 kg, SpO2 100%. Intubated and sedated Regular: No rub, normal S1-S2 Coarse breath sounds bilaterally Soft, nontender Lower extremity wounds bandaged Left femoral temporary HD catheter bandaged, clean and intact  A AKI on CKD 4 versus progression of CKD, starting HD during admission on 9/22, off CRRT 9/25; at this point he is likely ESRD PEA arrest 02/08/2024 x2; subsequent intermittent bradycardia episodes Urinary retention status post Foley and now on tamsulosin  Hypertension: Blood pressures have normalized after cardiac arrest Metabolic acidosis: Improved with dialysis Anemia, transfusion per CCM, on ESA Foot wounds and osteomyelitis of the fifth proximal phalanx of fifth metatarsal head on the right side on linezolid  and Unasyn  with orthopedics following Chronic DM2 Hypophosphatemia should improve now that CRRT is stopped Severe pulmonary hypertension followed by CCM and plan for right heart catheterization  P Continue optimization of volume status with HD tomorrow in the ICU, at least 3 L UF will be the target Keep femoral temporary HD catheter pending optimization of cardiac status, as his cardiac arrest occurred during attempted tunneled dialysis catheter placement Once more stable can evaluate for AV fistula/graft as well. Medication  Issues; Preferred narcotic agents for pain control are hydromorphone , fentanyl , and methadone. Morphine  should not be used.  Baclofen should be avoided Avoid oral sodium phosphate  and magnesium  citrate based laxatives / bowel preps   Discussed with  primary RN and Dr. Geronimo Bernardino Gasman MD 02/14/2024, 10:11 AM  Recent Labs  Lab 02/13/24 0321 02/13/24 0459 02/13/24 1900 02/14/24 0431  NA 140  138 141 136 136  136  K 4.0  3.9 4.0 3.4* 4.1  4.1  CL 104  102  --  95* 102  99  CO2 21*  21*  --  26 24  24   GLUCOSE 111*  109*  --  95 124*  127*  BUN 53*  54*  --  19 40*  41*  CREATININE 2.28*  2.20*  --  1.17 2.29*  2.14*  CALCIUM  7.8*  7.7*  --  8.7* 7.8*  7.9*  PHOS 2.8  --  1.5* 3.2   Recent Labs  Lab 02/12/24 1841 02/12/24 1846 02/13/24 0321 02/13/24 0459 02/14/24 0431  WBC 14.3*  --  18.4*  --  25.0*  HGB 9.2*   < > 9.3* 9.9* 9.7*  HCT 28.1*   < > 27.9* 29.0* 29.7*  MCV 99.3  --  97.6  --  99.3  PLT 89*  --  98*  --  118*   < > = values in this interval not displayed.

## 2024-02-15 ENCOUNTER — Inpatient Hospital Stay (HOSPITAL_COMMUNITY)

## 2024-02-15 DIAGNOSIS — R7401 Elevation of levels of liver transaminase levels: Secondary | ICD-10-CM

## 2024-02-15 DIAGNOSIS — N179 Acute kidney failure, unspecified: Secondary | ICD-10-CM | POA: Diagnosis not present

## 2024-02-15 DIAGNOSIS — I5033 Acute on chronic diastolic (congestive) heart failure: Secondary | ICD-10-CM | POA: Diagnosis not present

## 2024-02-15 DIAGNOSIS — N184 Chronic kidney disease, stage 4 (severe): Secondary | ICD-10-CM | POA: Diagnosis not present

## 2024-02-15 LAB — CBC
HCT: 25.1 % — ABNORMAL LOW (ref 39.0–52.0)
Hemoglobin: 8.1 g/dL — ABNORMAL LOW (ref 13.0–17.0)
MCH: 32.5 pg (ref 26.0–34.0)
MCHC: 32.3 g/dL (ref 30.0–36.0)
MCV: 100.8 fL — ABNORMAL HIGH (ref 80.0–100.0)
Platelets: 140 K/uL — ABNORMAL LOW (ref 150–400)
RBC: 2.49 MIL/uL — ABNORMAL LOW (ref 4.22–5.81)
RDW: 26.9 % — ABNORMAL HIGH (ref 11.5–15.5)
WBC: 17.6 K/uL — ABNORMAL HIGH (ref 4.0–10.5)
nRBC: 0.2 % (ref 0.0–0.2)

## 2024-02-15 LAB — COMPREHENSIVE METABOLIC PANEL WITH GFR
ALT: 249 U/L — ABNORMAL HIGH (ref 0–44)
AST: 671 U/L — ABNORMAL HIGH (ref 15–41)
Albumin: 1.7 g/dL — ABNORMAL LOW (ref 3.5–5.0)
Alkaline Phosphatase: 435 U/L — ABNORMAL HIGH (ref 38–126)
Anion gap: 12 (ref 5–15)
BUN: 54 mg/dL — ABNORMAL HIGH (ref 8–23)
CO2: 25 mmol/L (ref 22–32)
Calcium: 7.8 mg/dL — ABNORMAL LOW (ref 8.9–10.3)
Chloride: 101 mmol/L (ref 98–111)
Creatinine, Ser: 3.14 mg/dL — ABNORMAL HIGH (ref 0.61–1.24)
GFR, Estimated: 20 mL/min — ABNORMAL LOW (ref >60)
Glucose, Bld: 126 mg/dL — ABNORMAL HIGH (ref 70–99)
Potassium: 4 mmol/L (ref 3.5–5.1)
Sodium: 138 mmol/L (ref 135–145)
Total Bilirubin: 2.1 mg/dL — ABNORMAL HIGH (ref 0.0–1.2)
Total Protein: 7.2 g/dL (ref 6.5–8.1)

## 2024-02-15 LAB — GLUCOSE, CAPILLARY
Glucose-Capillary: 126 mg/dL — ABNORMAL HIGH (ref 70–99)
Glucose-Capillary: 126 mg/dL — ABNORMAL HIGH (ref 70–99)
Glucose-Capillary: 130 mg/dL — ABNORMAL HIGH (ref 70–99)
Glucose-Capillary: 136 mg/dL — ABNORMAL HIGH (ref 70–99)
Glucose-Capillary: 144 mg/dL — ABNORMAL HIGH (ref 70–99)
Glucose-Capillary: 88 mg/dL (ref 70–99)

## 2024-02-15 LAB — MAGNESIUM: Magnesium: 2.5 mg/dL — ABNORMAL HIGH (ref 1.7–2.4)

## 2024-02-15 MED ORDER — SODIUM CHLORIDE 0.9 % IV SOLN
2.0000 g | Freq: Once | INTRAVENOUS | Status: AC
  Administered 2024-02-15: 2 g via INTRAVENOUS
  Filled 2024-02-15: qty 12.5

## 2024-02-15 MED ORDER — SODIUM CHLORIDE 0.9 % IV SOLN
1.0000 g | INTRAVENOUS | Status: DC
  Administered 2024-02-16: 1 g via INTRAVENOUS
  Filled 2024-02-15 (×2): qty 10

## 2024-02-15 MED ORDER — VANCOMYCIN HCL 1750 MG/350ML IV SOLN
1750.0000 mg | Freq: Once | INTRAVENOUS | Status: AC
  Administered 2024-02-15: 1750 mg via INTRAVENOUS
  Filled 2024-02-15: qty 350

## 2024-02-15 MED ORDER — VANCOMYCIN VARIABLE DOSE PER UNSTABLE RENAL FUNCTION (PHARMACIST DOSING): Status: DC

## 2024-02-15 MED ORDER — METRONIDAZOLE 500 MG/100ML IV SOLN
500.0000 mg | Freq: Two times a day (BID) | INTRAVENOUS | Status: DC
  Administered 2024-02-15 – 2024-02-17 (×4): 500 mg via INTRAVENOUS
  Filled 2024-02-15 (×4): qty 100

## 2024-02-15 MED ORDER — ALBUMIN HUMAN 25 % IV SOLN
25.0000 g | Freq: Once | INTRAVENOUS | Status: AC
  Administered 2024-02-15: 25 g via INTRAVENOUS
  Filled 2024-02-15: qty 100

## 2024-02-15 NOTE — Progress Notes (Signed)
   02/15/24 1845  Vitals  Temp 97.8 F (36.6 C)  Pulse Rate 79  Resp (!) 21  BP (!) 99/54  SpO2 100 %  Post Treatment  Dialyzer Clearance Clear  Hemodialysis Intake (mL) 100 mL  Liters Processed 82.1  Fluid Removed (mL) 2500 mL  Tolerated HD Treatment Yes   Received patient in bed to unit.  Alert and oriented.  Informed consent signed and in chart.   TX duration:3.5hrs  Patient tolerated well.  Transported back to the room  Alert, without acute distress.  Hand-off given to patient's nurse.   Access used: [facility] Access issues: none  Total UF removed: 2.5L Medication(s) given: albumin   [name]

## 2024-02-15 NOTE — Consult Note (Addendum)
 [facility]    Date of Admission:  02/03/2024    Reason for Consult: Osteomyelitis     Referring Provider: Theodoro Lakes, MD  Antibiotics: Unasyn : 9/22 - Present Vancomycin : 9/29   Assessment and Plan: The patient is a 78 year old male with past medical history significant for chronic combined systolic and diastolic heart failure (EF 30-35%), essential hypertension, chronic hypoxic respiratory failure on 3L at baseline, CKD stage 4, CAD s/p stents, type 2 diabetes mellitus and chronic diabetic foot ulcers in bilateral lower extremities who presented to the ED from a SNF on September 17th due to worsening shortness of breath x 2 weeks. The patient was admitted for a CHF exacerbation. He unfortunately went into PEA arrest while having a dialysis catheter placed in order to start HD. He was intubated during the arrest and extubated on 9/28. He remains obtunded. Hospital course complicated by osteomyelitis/gangrene to both lower extremities. Patient initially refused the right lower extremity amputation that was offered by Orthopedic Surgery  Osteomyelitis/Gangrene Right Lower Extremity  Gangrene to Left Lower Extremity Combined Systolic and Diastolic Heart Failure Transaminitis - Possible Hepatic Congestion. Hepatitis work up is pending   - The patient is currently on Vancomycin  and Unasyn  - Will discontinue Unasyn  and broaden his antibiotics to add on Cefepime  and Metronidazole   - Further recommendations pending possible intervention with Orthopedic Surgery   Principal Problem:   Acute on chronic diastolic CHF (congestive heart failure) (HCC) Active Problems:   Essential hypertension   PAD (peripheral artery disease)   History of CAD (coronary artery disease)   Pressure injury of skin   CKD (chronic kidney disease) stage 4, GFR 15-29 ml/min (HCC)   Foot osteomyelitis, right (HCC)   Type 2 diabetes mellitus with hyperlipidemia (HCC)   Gangrene of right  foot (HCC)   Depression   Cardiac arrest (HCC)   Shock (HCC)   Uremia   Acute renal failure   Protein-calorie malnutrition, severe    aspirin   81 mg Per Tube Daily   atorvastatin   80 mg Per Tube QHS   Chlorhexidine  Gluconate Cloth  6 each Topical Daily   Chlorhexidine  Gluconate Cloth  6 each Topical Q0600   Chlorhexidine  Gluconate Cloth  6 each Topical Q0600   darbepoetin (ARANESP ) injection - DIALYSIS  60 mcg Subcutaneous Q Thu-1800   docusate  100 mg Per Tube BID   feeding supplement (PROSource TF20)  60 mL Per Tube BID   heparin   5,000 Units Subcutaneous Q8H   insulin  aspart  0-20 Units Subcutaneous Q4H   latanoprost   1 drop Both Eyes QHS   multivitamin with minerals  1 tablet Per Tube Daily   nutrition supplement (JUVEN)  1 packet Per Tube BID BM   mouth rinse  15 mL Mouth Rinse Q2H   sertraline   25 mg Per Tube Daily   sodium chloride  flush  3 mL Intravenous Q12H   [START ON 02/16/2024] vancomycin  variable dose per unstable renal function (pharmacist dosing)   Does not apply See admin instructions   HPI: David [name] is a 78 y.o. male with past medical history significant for chronic combined systolic and diastolic heart failure (EF 30-35%), essential hypertension, chronic hypoxic respiratory failure on 3L at baseline, CKD stage 4, CAD s/p stents, type 2 diabetes mellitus and chronic diabetic foot ulcers in bilateral lower extremities who presented to the ED from a SNF on September 17th due to worsening shortness of breath x 2 weeks.  Work up in the ED showed pulmonary edema on chest xray, BNP of 704 and a creatinine of 5. The patient was admitted for a heart failure exacerbation. Nephrology was then consulted for AKI on CKD and the decision was made to proceed with dialysis. The patient then went to IR on September 22nd for dialysis catheter placement and during the advancement of the guidewire into the right internal jugular, the patient became bradycardic and became unresponsive.  PEA arrest. CPR initiated. ROSC achieved. The patient was intubated during the code and then transferred to the ICU.  The patient was extubated on September 28th but he remains obtunded. Echo completed this admission shows an EF of 35%, RVSP 61 and moderate pericardial effusion. Elevated LFTs noted. Orthopedic Surgery consultation for gangrene to both lower extremities. The patient is status post right great toe and second toe amputation and left 5th ray amputation. At the time of Orthos evaluation, the patient declined amputation of the right lower extremity. MRI of the right lower extremity shows signs of osteomyelitis of the fifth proximal phalanx and the fifth metatarsal head and neck with overlying soft tissue ulceration at the lateral plantar forefoot. Infectious Disease was consulted for antibiotics recommendations while waiting possible amputation. The patient is currently on Vancomycin  and Unasyn    Review of Systems: Unable to be assessed due to mental status   Past Medical History:  Diagnosis Date   Anemia    Arthritis    CAD in native artery    s/p stent in 11/18   CHF (congestive heart failure) (HCC)    normal echo in 11/18   CKD (chronic kidney disease) stage 3, GFR 30-59 ml/min (HCC)    DDD (degenerative disc disease), cervical    Diabetes mellitus with complication (HCC)    Type II   Diabetic neuropathy (HCC)    Diabetic retinopathy (HCC)    GERD (gastroesophageal reflux disease)    Glaucoma    Hypertension     Social History   Tobacco Use   Smoking status: Former    Current packs/day: 0.00    Average packs/day: 0.3 packs/day for 5.0 years (1.3 ttl pk-yrs)    Types: Cigarettes    Start date: 96    Quit date: 1996    Years since quitting: 29.7    Passive exposure: Past   Smokeless tobacco: Never  Vaping Use   Vaping status: Never Used  Substance Use Topics   Alcohol use: Not Currently    Comment: occ   Drug use: No    Family History  Problem Relation  Age of Onset   Diabetes Other    Hyperlipidemia Other    Hypertension Other    Stroke Other    Alzheimer's disease Other    Thyroid  disease Mother    Diabetes Mellitus II Father    Alzheimer's disease Father    Allergies  Allergen Reactions   Nsaids Other (See Comments)    CKD stage 3    OBJECTIVE: Blood pressure (!) 109/55, pulse 75, temperature 99.2 F (37.3 C), resp. rate (!) 21, height 6' 2 (1.88 m), weight 82.5 kg, SpO2 100%.  General: Elderly male lying in bed in no apparent distress HENT: Moist mucous membranes, normal nose, normal external ears, and normocephalic Neck: Supple, trachea midline, and normal cervical range of motion Eyes: PERRL, EOMI, non-icteric, and normal conjunctivae and lids Lungs: Rales appreciated bilaterally  Cardiac: Regular rate and rhythm. No murmurs, rubs or gallops. No peripheral edema Abdomen: Soft, Non distended, Non  tender, active bowel sounds Skin: Bilateral lower extremity wounds  GU: Deferred genital exam Musculoskeletal: No obvious skeletal abnormalities Neuro: Obtunded  Psych: Unable to be assessed   Lab Results Lab Results  Component Value Date   WBC 17.6 (H) 02/15/2024   HGB 8.1 (L) 02/15/2024   HCT 25.1 (L) 02/15/2024   MCV 100.8 (H) 02/15/2024   PLT 140 (L) 02/15/2024    Lab Results  Component Value Date   CREATININE 3.14 (H) 02/15/2024   BUN 54 (H) 02/15/2024   NA 138 02/15/2024   K 4.0 02/15/2024   CL 101 02/15/2024   CO2 25 02/15/2024    Lab Results  Component Value Date   ALT 249 (H) 02/15/2024   AST 671 (H) 02/15/2024   ALKPHOS 435 (H) 02/15/2024   BILITOT 2.1 (H) 02/15/2024    Microbiology: Recent Results (from the past 240 hours)  MRSA Next Gen by PCR, Nasal     Status: None   Collection Time: 02/06/24  4:23 AM   Specimen: Nasal Mucosa; Nasal Swab  Result Value Ref Range Status   MRSA by PCR Next Gen NOT DETECTED NOT DETECTED Final    Comment: (NOTE) The GeneXpert MRSA Assay (FDA approved for NASAL  specimens only), is one component of a comprehensive MRSA colonization surveillance program. It is not intended to diagnose MRSA infection nor to guide or monitor treatment for MRSA infections. Test performance is not FDA approved in patients less than 74 years old. Performed at [facility], [address], [place], [place] [address]   Culture, blood (Routine X 2) w Reflex to ID Panel     Status: None   Collection Time: 02/08/24  7:19 PM   Specimen: BLOOD RIGHT ARM  Result Value Ref Range Status   Specimen Description BLOOD RIGHT ARM  Final   Special Requests   Final    BOTTLES DRAWN AEROBIC AND ANAEROBIC Blood Culture adequate volume   Culture   Final    NO GROWTH 5 DAYS Performed at [facility], [address] [place], [place] [address]    Report Status 02/13/2024 FINAL  Final  Culture, blood (Routine X 2) w Reflex to ID Panel     Status: None   Collection Time: 02/08/24  7:20 PM   Specimen: BLOOD  Result Value Ref Range Status   Specimen Description BLOOD SITE NOT SPECIFIED  Final   Special Requests   Final    BOTTLES DRAWN AEROBIC AND ANAEROBIC Blood Culture adequate volume   Culture   Final    NO GROWTH 5 DAYS Performed at [facility], [address] [place], [place] [address]    Report Status 02/13/2024 FINAL  Final  Culture, Respiratory w Gram Stain     Status: None (Preliminary result)   Collection Time: 02/14/24 12:37 PM   Specimen: Tracheal Aspirate; Respiratory  Result Value Ref Range Status   Specimen Description TRACHEAL ASPIRATE  Final   Special Requests NONE  Final   Gram Stain   Final    RARE WBC SEEN MODERATE YEAST Performed at [facility], [address] [place], [place] [address]    Culture PENDING  Incomplete   Report Status PENDING  Incomplete   Evalene Munch, MD [facility]  02/15/2024 2:27 PM

## 2024-02-15 NOTE — Progress Notes (Signed)
 Nutrition Follow-up  DOCUMENTATION CODES:  Severe malnutrition in context of chronic illness  INTERVENTION:  Continue tube feeding via cortrak: Vital 1.5 at 60 ml/h (1440 ml per day) Prosource TF20 60 ml BID Provides 2320 kcal, 137 gm protein, 1100 ml free water  daily MVI daily 1 packet Juven BID, each packet provides 95 calories, 2.5 grams of protein (collagen) + micronutrients to support wound healing   NUTRITION DIAGNOSIS:  Severe Malnutrition related to chronic illness (CHF, wounds) as evidenced by severe fat depletion, severe muscle depletion. - remains applicable  GOAL:  Patient will meet greater than or equal to 90% of their needs -progressing, TF at goal  MONITOR:  TF tolerance, I & O's, Vent status, Labs  REASON FOR ASSESSMENT:  Consult Assessment of nutrition requirement/status (CRRT)  ASSESSMENT:  Pt with hx of HTN, GERD, CHF, CAD, DM type 2, gastroparesis, CKD4, and anemia presented to ED from [facility] with SOB. Workup suggestive of CHF exacerbation.  9/17 - presented to ED from [facility] 9/21 - SLP BSE, DYS1/Nectar 9/22 - tunneled HD catheter attempted in IR but pt arrested x2, intubated and transferred to ICU 9/25 - CRRT discontinued 9/26 - cortrak placed (gastric), pt vomited in the PM, TF held 9/27 - pt continues to vomit 9/28 - extubated, trickles restarted  Pt resting in bed at the time of assessment, no family present this AM. TF infusing at 27mL/h. Pt discussed during ICU rounds and with RN and MD. Vomiting over the weekend even after TF had been held x 24 hours. Was restarted back at a trickle and so far has been tolerating.   Pt yelling out intermittently when RN repositioning. Imaging this AM shows concern for new hepatic issues.   SLP evaluated, unable to recommend a diet at this time. Will continue current regimen and monitor for tolerance.   Admit weight: 95.3 kg  Current weight: 82.5 kg  Weight loss noted over the last 9 months  (04/2023-01/2024, 19.9%) which is severe. 5.2% noted over the last 5 months. Unsure if 9 month loss is accurate, but pt is most certainly trending down over time.    Intake/Output Summary (Last 24 hours) at 02/15/2024 1207 Last data filed at 02/15/2024 0800 Gross per 24 hour  Intake 541.83 ml  Output 0 ml  Net 541.83 ml  Net IO Since Admission: -3,041.32 mL [02/15/24 1207]  Drains/Lines: Art Line, left femoral Non tunneled HD cath, left femoral triple lumen cortrak (gastric) UOP 30mL out yesterday  Average Meal Intake: 9/17-9/21: 4% intake x 7 recorded meals  Nutritionally Relevant Medications: Scheduled Meds:  atorvastatin   80 mg Per Tube QHS   dicyclomine   10 mg Per Tube TID AC & HS   docusate  100 mg Oral BID   PROSource TF20  60 mL Per Tube BID   insulin  aspart  0-15 Units Subcutaneous Q4H   linezolid   600 mg Per Tube Q12H   multivitamin with minerals  1 tablet Per Tube Daily   JUVEN  1 packet Per Tube BID BM   pantoprazole  IV  40 mg Intravenous Daily   thiamine   100 mg Per Tube Daily   Continuous Infusions:  ampicillin -sulbactam (UNASYN ) IV 3 g (02/12/24 0926)   feeding supplement (VITAL 1.5 CAL) 60 mL/hr at 02/12/24 0800   PRN Meds: bisacodyl , ondansetron ,  polyethylene glycol  Labs Reviewed: BUN 40, creatinine 1.8 Phosphorus 2.2 Magnesium  2.5 Chloride 112 CBG ranges from 132-240 mg/dL over the last 24 hours HgbA1c 6.0% (10/30/23)  NUTRITION -  FOCUSED PHYSICAL EXAM: Flowsheet Row Most Recent Value  Orbital Region Severe depletion  Upper Arm Region Severe depletion  Thoracic and Lumbar Region Moderate depletion  Buccal Region Severe depletion  Temple Region Severe depletion  Clavicle Bone Region Moderate depletion  Clavicle and Acromion Bone Region Severe depletion  Scapular Bone Region Moderate depletion  Dorsal Hand Unable to assess  [mittens]  Patellar Region Severe depletion  Anterior Thigh Region Moderate depletion  Posterior Calf Region Moderate  depletion  Edema (RD Assessment) Mild  [BLE]  Hair Reviewed  Eyes Reviewed  Mouth Reviewed  Skin Reviewed  Nails Unable to assess  [mittens]    Diet Order:   Diet Order             Diet NPO time specified  Diet effective now                  EDUCATION NEEDS:  Education needs have been addressed  Skin:  Skin Assessment: Skin Integrity Issues: Per WOC 9/18: Unstageable pressure injury: - Left heel with 100% dry eschar, 3x3 cm - Right heel with 100% dry eschar, 2x2 cm Full thickness wound with osteomyelitis - Right outer foot 5X3cm, red with bloody drainage to anteror foot, eschar to plantar foot. Noted Orthopedics recommended amputation this admission, pt refused  Additional wounds per flowsheet: Diabetic Ulcer: - 3rd toe left foot (1 x 1 x 0.01 cm) - Right foot (5 x 3 cm) Stage 2: - left buttock (10 x 10 cm)  Last BM:  9/28 - type 6  Height:  Ht Readings from Last 1 Encounters:  02/13/24 6' 2 (1.88 m)    Weight:  Wt Readings from Last 1 Encounters:  02/15/24 82.5 kg    Ideal Body Weight:  86.4 kg  BMI:  Body mass index is 23.35 kg/m.  Estimated Nutritional Needs:  Kcal:  2300-2500 kcal/d Protein:  120-140g/d Fluid:  2.3L/d    Vernell Lukes, RD, LDN, CNSC Registered Dietitian II Please reach out via secure chat

## 2024-02-15 NOTE — Plan of Care (Signed)
  Problem: Clinical Measurements: Goal: Ability to maintain clinical measurements within normal limits will improve Outcome: Progressing Goal: Will remain free from infection Outcome: Progressing Goal: Diagnostic test results will improve Outcome: Progressing Goal: Respiratory complications will improve Outcome: Progressing Goal: Cardiovascular complication will be avoided Outcome: Progressing   Problem: Activity: Goal: Risk for activity intolerance will decrease Outcome: Progressing   Problem: Elimination: Goal: Will not experience complications related to bowel motility Outcome: Progressing Goal: Will not experience complications related to urinary retention Outcome: Progressing   Problem: Pain Managment: Goal: General experience of comfort will improve and/or be controlled Outcome: Progressing   Problem: Education: Goal: Ability to demonstrate management of disease process will improve Outcome: Progressing Goal: Ability to verbalize understanding of medication therapies will improve Outcome: Progressing Goal: Individualized Educational Video(s) Outcome: Progressing

## 2024-02-15 NOTE — Progress Notes (Signed)
 Pharmacy Antibiotic Note  David [name] is a 78 y.o. male admitted on 02/03/2024 with osteomyelitis, gangrenous lower extremitiy.  Pharmacy has been consulted for Vancomycin  dosing. Stopping Linezolid  with worsening liver labs. Remains on Unasyn .   Nephrology seeing for iHD -plan for iHD today (no set schedule inpatient at this time). Patient starting HD now.   Plan: Discontinue Linezolid  - last dose 9:37 AM.  Vancomycin  1750mg  IV x1 this PM (load post HD since starting HD now).  Follow-up for next HD timing for further dosing.     Height: 6' 2 (188 cm) Weight: 82.5 kg (181 lb 14.1 oz) IBW/kg (Calculated) : 82.2  Temp (24hrs), Avg:99 F (37.2 C), Min:98.1 F (36.7 C), Max:99.7 F (37.6 C)  Recent Labs  Lab 02/08/24 1513 02/09/24 0437 02/11/24 0430 02/11/24 1514 02/12/24 1841 02/12/24 1853 02/12/24 2022 02/13/24 0321 02/13/24 1900 02/14/24 0431 02/14/24 1600 02/15/24 0344  WBC 11.7*   < > 9.5  --  14.3*  --   --  18.4*  --  25.0*  --  17.6*  CREATININE 6.21*   < > 1.47*   < >  --   --    < > 2.28*  2.20* 1.17 2.29*  2.14* 2.94* 3.14*  LATICACIDVEN 1.1  --   --   --   --  1.7  --   --   --   --   --   --    < > = values in this interval not displayed.    Estimated Creatinine Clearance: 23.3 mL/min (A) (by C-G formula based on SCr of 3.14 mg/dL (H)).    Allergies  Allergen Reactions   Nsaids Other (See Comments)    CKD stage 3     Antimicrobials this admission: Vanc 9/17>>9/19 Zosyn  9/17>>9/19 Unasyn  9/19>> Linezolid  9/19>>  Dose adjustments this admission:   Microbiology results: 9/17 BCx x2: ngF 9/20 MRSA PCR negative 9/28 TA: (stain - mod yeast) >>    Thank you for allowing pharmacy to be a part of this patient's care.  Harlene Boga, PharmD, BCPS, BCCCP Clinical Pharmacist Please refer to [facility] for [facility] Pharmacy numbers 02/15/2024 1:32 PM

## 2024-02-15 NOTE — TOC Progression Note (Signed)
 Transition of Care [facility]) - Progression Note    Patient Details  Name: David [name] MRN: [id number] Date of Birth: [birth year]  [facility] CM/SW Contact  Lauraine FORBES Saa, LCSWA Phone Number: 02/15/2024, 1:54 PM  Clinical Narrative:     1:54 PM Per progressions, patient is not yet medically ready to discharge. CSW sent patient's FL2 to [facility] LTC. CSW will continue to follow and be available to assist.  Expected Discharge Plan: Long Term Nursing Home Barriers to Discharge: Continued Medical Work up               Expected Discharge Plan and Services In-house Referral: Clinical Social Work   Post Acute Care Choice: Skilled Nursing Facility Living arrangements for the past 2 months: Skilled Nursing Facility                                       Social Drivers of Health (SDOH) Interventions SDOH Screenings   Food Insecurity: Patient Unable To Answer (02/04/2024)  Housing: Patient Unable To Answer (02/04/2024)  Transportation Needs: No Transportation Needs (01/02/2024)  Utilities: Not At Risk (01/02/2024)  Alcohol Screen: Low Risk  (05/18/2023)  Financial Resource Strain: Low Risk  (05/18/2023)  Physical Activity: Inactive (08/20/2017)  Social Connections: Socially Integrated (01/02/2024)  Stress: No Stress Concern Present (08/20/2017)  Tobacco Use: Medium Risk (02/08/2024)    Readmission Risk Interventions    11/09/2023   11:30 AM 11/06/2023    8:44 AM  Readmission Risk Prevention Plan  Transportation Screening Complete Complete  Medication Review (RN Care Manager) Complete Complete  PCP or Specialist appointment within 3-5 days of discharge Complete   HRI or Home Care Consult Complete Complete  SW Recovery Care/Counseling Consult Complete Complete  Palliative Care Screening Not Applicable Not Applicable  Skilled Nursing Facility Complete Not Applicable

## 2024-02-15 NOTE — Progress Notes (Signed)
 eLink Physician-Brief Progress Note Patient Name: David [name] DOB: 10-10-1945 MRN: [id number]   Date of Service  02/15/2024  HPI/Events of Note  Notified of rhonchi bil,  extubated/IHD yesterday with poor cough. Suggesting CXR, NTS orders and chest PT  eICU Interventions  CXR, nasotracheal suctioning and chest physiotherapy ordered     Intervention Category Intermediate Interventions: Other:  Damien ONEIDA Grout 02/15/2024, 1:07 AM

## 2024-02-15 NOTE — [place] FL2 (Signed)
 [place]  MEDICAID FL2 LEVEL OF CARE FORM     IDENTIFICATION  Patient Name: David [name] Birthdate: Jun 08, 1945 Sex: male Admission Date (Current Location): 02/03/2024  [facility] and [place] Number:  Producer, television/film/video and Address:  [facility] [place], [address], [place], [place] [address]      Provider Number: [id number]  Attending Physician Name and Address:  Theodoro Lakes, MD  Relative Name and Phone Number:  Rommel Hogston; Daughter; [phone]    Current Level of Care: Hospital Recommended Level of Care: Skilled Nursing Facility Prior Approval Number:    Date Approved/Denied:   PASRR Number: [id number]  Discharge Plan: SNF    Current Diagnoses: Patient Active Problem List   Diagnosis Date Noted   Protein-calorie malnutrition, severe 02/10/2024   Cardiac arrest (HCC) 02/08/2024   Shock (HCC) 02/08/2024   Uremia 02/08/2024   Acute renal failure 02/08/2024   Depression 02/07/2024   Gangrene of right foot (HCC) 02/05/2024   Pressure injury of skin 02/04/2024   CKD (chronic kidney disease) stage 4, GFR 15-29 ml/min (HCC) 02/04/2024   Foot osteomyelitis, right (HCC) 02/04/2024   Type 2 diabetes mellitus with hyperlipidemia (HCC) 02/04/2024   Acute CHF (HCC) 02/03/2024   Failure to thrive in adult 01/02/2024   Hypoglycemia 01/02/2024   History of CAD (coronary artery disease) 01/02/2024   History of gastrointestinal ulcer 01/02/2024   Generalized anxiety disorder 01/02/2024   Troponin level elevated 11/18/2023   Chronic anemia 09/04/2023   Chronic combined systolic and diastolic CHF (congestive heart failure) (HCC) 09/04/2023   Acute on chronic combined systolic and diastolic CHF (congestive heart failure) (HCC) 05/12/2023   Malnutrition of moderate degree 05/06/2023   Diabetic foot infection (HCC) 05/05/2023   Sacral wound 05/05/2023   Pleural effusion on right 05/05/2023   Elevated TSH 05/05/2023   Acute on chronic diastolic  (congestive) heart failure (HCC) 05/05/2023   Diastolic CHF (HCC) 05/04/2023   Class 1 obesity 06/30/2022   Acute on chronic diastolic CHF (congestive heart failure) (HCC) 06/29/2022   PAD (peripheral artery disease) 06/13/2022   Critical limb ischemia of left lower extremity (HCC) 03/21/2021   Nonhealing ulcer of left lower extremity limited to breakdown of skin (HCC) 03/21/2021   Abscess of left foot 08/03/2020   Osteomyelitis of fifth toe of left foot (HCC)    Mixed hyperlipidemia 10/15/2018   Bilateral leg edema 09/17/2018   Stage 3b chronic kidney disease (HCC) 09/10/2018   Anemia 09/10/2018   Pain and swelling of wrist, right 08/14/2018   Acute kidney injury superimposed on chronic kidney disease 08/06/2018   Bilateral carotid artery stenosis 07/30/2018   Coronary artery disease involving native coronary artery of native heart without angina pectoris 07/30/2018   Snoring 02/11/2018   Chronic heart failure with preserved ejection fraction (HFpEF) (HCC) 12/22/2017   At risk for adverse drug reaction 12/15/2017   Chronic pain syndrome 12/15/2017   Status post coronary artery stent placement    Pneumonia due to infectious organism 03/30/2017   Insulin  dependent type 2 diabetes mellitus (HCC) 03/29/2017   Acute respiratory failure with hypoxemia (HCC) 03/29/2017   Acute on chronic respiratory failure with hypoxia (HCC)    Pulmonary congestion    Acquired contracture of Achilles tendon, right 08/19/2016   Amputated great toe, right 07/18/2016   Onychomycosis 05/29/2016   Diabetic polyneuropathy associated with type 2 diabetes mellitus (HCC) 04/09/2016   Right foot ulcer, limited to breakdown of skin (HCC) 04/09/2016   Spinal stenosis  of lumbar region without neurogenic claudication 05/24/2014   Peripheral neuropathy 09/15/2013   Malaise 08/14/2013   Essential hypertension 08/14/2013   Chronic back pain 08/14/2013   Glaucoma 08/14/2013   Headache 08/14/2013    Orientation  RESPIRATION BLADDER Height & Weight     Self  Normal (3L nasal cannula) Incontinent, Indwelling catheter Weight: 181 lb 14.1 oz (82.5 kg) Height:  6' 2 (188 cm)  BEHAVIORAL SYMPTOMS/MOOD NEUROLOGICAL BOWEL NUTRITION STATUS      Incontinent Diet (Please see discharge summary)  AMBULATORY STATUS COMMUNICATION OF NEEDS Skin   Extensive Assist Verbally Other (Comment), PU Stage and Appropriate Care (Diabetic Ulcer Toe Anterior L; Pressure Injury Buttocks L Stage 2; Wound Diabetic Ulcer Foot R; Pressure Injury Heel R Unstageable x2; Throat R; Pressure Injury Heel L Unstageable ;Lateral full thickness wound R outer foot Non-pressure wound;Skin tear)                       Personal Care Assistance Level of Assistance  Bathing, Feeding, Dressing Bathing Assistance: Maximum assistance Feeding assistance: Maximum assistance Dressing Assistance: Maximum assistance     Functional Limitations Info  Sight Sight Info: Impaired (R Blind L Impaired)        SPECIAL CARE FACTORS FREQUENCY  PT (By licensed PT), OT (By licensed OT), Speech therapy     PT Frequency: 5x OT Frequency: 5x     Speech Therapy Frequency: 3x      Contractures Contractures Info: Not present    Additional Factors Info  Code Status, Allergies, Psychotropic, Insulin  Sliding Scale Code Status Info: DNR PRE-ARREST INTERVENTIONS DESIRED Allergies Info: Nsaids Psychotropic Info: Please see discharge summary Insulin  Sliding Scale Info: Please see discharge summary       Current Medications (02/15/2024):  This is the current hospital active medication list Current Facility-Administered Medications  Medication Dose Route Frequency Provider Last Rate Last Admin   acetaminophen  (TYLENOL ) tablet 650 mg  650 mg Per Tube Q6H PRN Fobbs, Rodericks T, RPH   650 mg at 02/14/24 1512   Or   acetaminophen  (TYLENOL ) suppository 650 mg  650 mg Rectal Q6H PRN Fobbs, Rodericks T, RPH       alteplase (CATHFLO ACTIVASE) injection 2 mg   2 mg Intracatheter Once PRN Foster, Lori C, MD       alteplase (CATHFLO ACTIVASE) injection 2 mg  2 mg Intracatheter Once PRN Marlee Bernardino NOVAK, MD       Ampicillin -Sulbactam (UNASYN ) 3 g in sodium chloride  0.9 % 100 mL IVPB  3 g Intravenous Q12H Olalere, Adewale A, MD 200 mL/hr at 02/15/24 0940 3 g at 02/15/24 0940   anticoagulant sodium citrate solution 5 mL  5 mL Intracatheter PRN Jerrye Katheryn BROCKS, MD       anticoagulant sodium citrate solution 5 mL  5 mL Intracatheter PRN Marlee Bernardino NOVAK, MD       aspirin  chewable tablet 81 mg  81 mg Per Tube Daily Fobbs, Rodericks T, RPH   81 mg at 02/15/24 0938   atorvastatin  (LIPITOR ) tablet 80 mg  80 mg Per Tube QHS Fobbs, Rodericks T, RPH   80 mg at 02/14/24 2115   bisacodyl  (DULCOLAX) suppository 10 mg  10 mg Rectal Daily PRN Lama, Gagan S, MD       Chlorhexidine  Gluconate Cloth 2 % PADS 6 each  6 each Topical Daily Jerrye Katheryn BROCKS, MD   6 each at 02/15/24 0802   Chlorhexidine  Gluconate Cloth 2 % PADS 6 each  6 each Topical Q0600 Jerrye Katheryn BROCKS, MD   6 each at 02/12/24 2140   Chlorhexidine  Gluconate Cloth 2 % PADS 6 each  6 each Topical Q0600 Marlee Bernardino NOVAK, MD       Darbepoetin Alfa  (ARANESP ) injection 60 mcg  60 mcg Subcutaneous Q Thu-1800 Foster, Lori C, MD   60 mcg at 02/11/24 1810   docusate (COLACE) 50 MG/5ML liquid 100 mg  100 mg Per Tube BID Ramaswamy, Murali, MD   100 mg at 02/14/24 2115   feeding supplement (PROSource TF20) liquid 60 mL  60 mL Per Tube BID Olalere, Adewale A, MD   60 mL at 02/15/24 0937   feeding supplement (VITAL 1.5 CAL) liquid 1,000 mL  1,000 mL Per Tube Continuous Olalere, Adewale A, MD 30 mL/hr at 02/15/24 0757 Rate Change at 02/15/24 0757   fentaNYL  (SUBLIMAZE ) injection 25-100 mcg  25-100 mcg Intravenous Q15 min PRN Ramaswamy, Murali, MD   50 mcg at 02/14/24 0308   heparin  injection 1,000 Units  1,000 Units Intracatheter PRN Jerrye Katheryn BROCKS, MD   2,800 Units at 02/13/24 1823   heparin  injection 1,000 Units  1,000 Units  Intracatheter PRN Marlee Bernardino NOVAK, MD       heparin  injection 5,000 Units  5,000 Units Subcutaneous Q8H Lama, Gagan S, MD   5,000 Units at 02/15/24 1312   insulin  aspart (novoLOG ) injection 0-20 Units  0-20 Units Subcutaneous Q4H Ramaswamy, Murali, MD   3 Units at 02/15/24 1311   latanoprost  (XALATAN ) 0.005 % ophthalmic solution 1 drop  1 drop Both Eyes QHS Drusilla Sabas RAMAN, MD   1 drop at 02/14/24 2119   lidocaine  (PF) (XYLOCAINE ) 1 % injection 5 mL  5 mL Intradermal PRN Jerrye Katheryn BROCKS, MD       lidocaine -prilocaine (EMLA) cream 1 Application  1 Application Topical PRN Jerrye Katheryn BROCKS, MD       midazolam  (VERSED ) injection 2 mg  2 mg Intravenous Q2H PRN Ogan, Okoronkwo U, MD   2 mg at 02/13/24 1816   multivitamin with minerals tablet 1 tablet  1 tablet Per Tube Daily Olalere, Adewale A, MD   1 tablet at 02/15/24 9062   nutrition supplement (JUVEN) (JUVEN) powder packet 1 packet  1 packet Per Tube BID BM Olalere, Adewale A, MD   1 packet at 02/15/24 1312   Oral care mouth rinse  15 mL Mouth Rinse Q2H Kassie Acquanetta Bradley, MD   15 mL at 02/15/24 1313   Oral care mouth rinse  15 mL Mouth Rinse PRN Kassie Acquanetta Bradley, MD       pentafluoroprop-tetrafluoroeth JUANA) aerosol 1 Application  1 Application Topical PRN Jerrye Katheryn BROCKS, MD       prochlorperazine  (COMPAZINE ) injection 5 mg  5 mg Intravenous Q6H PRN Ramaswamy, Murali, MD   5 mg at 02/14/24 2124   sertraline  (ZOLOFT ) tablet 25 mg  25 mg Per Tube Daily Fobbs, Rodericks T, RPH   25 mg at 02/15/24 0937   sodium chloride  flush (NS) 0.9 % injection 3 mL  3 mL Intravenous Q12H Drusilla Sabas RAMAN, MD   3 mL at 02/15/24 0802   sodium chloride  flush (NS) 0.9 % injection 3 mL  3 mL Intravenous PRN Drusilla Sabas RAMAN, MD       vancomycin  (VANCOREADY) IVPB 1750 mg/350 mL  1,750 mg Intravenous Once Millen, Jessica B, RPH       [START ON 02/16/2024] vancomycin  variable dose per unstable renal function (pharmacist dosing)  Does not apply See admin instructions Millen, Jessica  B, RPH         Discharge Medications: Please see discharge summary for a list of discharge medications.  Relevant Imaging Results:  Relevant Lab Results:   Additional Information SSN: [id number]  Lauraine FORBES Saa, LCSWA

## 2024-02-15 NOTE — Progress Notes (Signed)
 NAME:  David [name], MRN:  [id number], DOB:  Aug 10, 1945, LOS: 12 ADMISSION DATE:  02/03/2024 CHIEF COMPLAINT:  PEA arrest.    History of Present Illness:  David [name] is a 78 year old gentleman with PMH of chronic combined systolic and diastolic heart failure (EF 30-35%), essential HTN, chronic hypoxic respiratory failure (on baseline 3L O2), CKD stage IV, CAD s/p , T2DM, chronic diabetic foot ulcers in bilateral lower extremities who presented to the hospital from SNF on 9/17 for worsening dyspnea for the previous 2 weeks. In the ED a CXR demonstrated pulmonary edema, BNP was elevated to 704, Cr was elevated to 5.01. He was admitted to [facility] for heart failure exacerbation.    Nephrology was consulted for AKI on CKD versus progression of CKD and ultimately decision was made to pursue HD for volume management. He went to IR on 9/22 for dialysis catheter placement. During advancement of the guidewire in the right internal jugular he became bradycardic and subsequently became unresponsive found to be in PEA arrest. CPR was started and procedure was aborted. He was intubated during the arrest. After 3 minutes of CPR, epi, calcium , and sodium bicarbonate  pushes ROSC was achieved. He was transferred to [facility] ICU for further management.     Interim History / Subjective:  Patient not oriented.  Obtunded.  Not following commands.  Protecting airways. Worsening LFTs today.  Significant Hospital Events:  has a past medical history of Anemia, Arthritis, CAD in native artery, CHF (congestive heart failure) (HCC), CKD (chronic kidney disease) stage 3, GFR 30-59 ml/min (HCC), DDD (degenerative disc disease), cervical, Diabetes mellitus with complication (HCC), Diabetic neuropathy (HCC), Diabetic retinopathy (HCC), GERD (gastroesophageal reflux disease), Glaucoma, and Hypertension.    has a past surgical history that includes Joint replacement; Cyst excision; Total hip arthroplasty; Back surgery; Amputation (Right,  07/09/2016); RIGHT/LEFT HEART CATH AND CORONARY ANGIOGRAPHY (N/A, 04/06/2017); CORONARY STENT INTERVENTION (N/A, 04/06/2017); CORONARY STENT INTERVENTION (N/A, 04/07/2017); Cardiac catheterization; Laparoscopic abdominal exploration (N/A, 11/26/2017); Amputation (Right, 05/28/2018); Toe Surgery (05/2018); Endotracheal intubation emergent (08/07/2018); Amputation (Left, 08/03/2020); Esophagogastroduodenoscopy (Left, 11/07/2023); and IR TUNNELED CENTRAL VENOUS CATH PLC W IMG (02/08/2024).  Objective    Blood pressure (!) 110/53, pulse 83, temperature 99.2 F (37.3 C), resp. rate (!) 23, height 6' 2 (1.88 m), weight 82.5 kg, SpO2 100%.    FiO2 (%):  [32 %] 32 %   Intake/Output Summary (Last 24 hours) at 02/15/2024 1226 Last data filed at 02/15/2024 0800 Gross per 24 hour  Intake 541.83 ml  Output 0 ml  Net 541.83 ml   Filed Weights   02/13/24 1915 02/14/24 0256 02/15/24 0703  Weight: 84.5 kg 82.5 kg 82.5 kg    Examination: General: Elderly male who is obtunded. Lungs: Rales bilaterally. Heart: regular rate rhythm, no murmur appreciated.  Abdomen: non tender, non distended. Normal BS.  Neuro: Obtunded and not following commands.  Protecting airways. Bilateral lower extremity showing chronic wounds.   Assessment and Plan  78 year old gentleman with PMH of chronic combined systolic and diastolic heart failure (EF 30-35%), essential HTN, chronic hypoxic respiratory failure (on baseline 3L O2), CKD stage IV, CAD s/p , T2DM, chronic diabetic foot ulcers in bilateral lower extremities who presented to the hospital from SNF on 9/17 for worsening dyspnea for the previous 2 weeks. Was being treated for heart failure exacerbation.  Underwent HD catheter placement with IR and had PEA arrest for 3 minutes.  Had a repeat PEA arrest.  Hence he was presented to  the ICU.  Combined systolic and diastolic heart failure: AKI on CKD4: Transaminitis: Likely related to congestive hepatopathy -Continue with fluid  removal with help of dialysis.  Has a left femoral line in place. - Nephrology following.  Plan for ultrafiltration today. - Liver ultrasound.  Will order hepatitis panel for tomorrow.  Shock: Likely combination of cardiogenic and septic. Off pressors.  Maintain MAP greater than 65.  Pulmonary hypertension likely group 2: - Echo September 2025: EF 35%, RVSP 61, moderate pericardial effusion and TR V-max 3.4 m/s. - Continue fluid removal with HD. - Will consider right heart cath in future.  Continue Unasyn .  Atrial fibrillation: Now in normal sinus rhythm.  Gangrenous changes in lower extremity: Osteomyelitis of 5th phalanx and metatarsal,:  - Being followed by orthopedics.  Status post right great toe and second toe amputation and left fifth ray amputation. - Patient was against amputation when seen by orthopedics. - Will need further medical optimization prior to amputation. - Will consult ID for input on antibiotic management. - Linezolid  changed to vancomycin . - ID consult.  Acute hypoxemic respiratory failure: Cardiac arrest x 2:  - Status post extubation.  On nasal cannula. - He is obtunded but arousable. - Monitor.  Will consider CT head in future.  Anemia of chronic disease: - Keep hemoglobin greater than 7.  DM 2: - Continue SSI.  Continue home Zoloft .    Labs   CBC: Recent Labs  Lab 02/11/24 0430 02/12/24 1841 02/12/24 1846 02/13/24 0321 02/13/24 0459 02/14/24 0431 02/15/24 0344  WBC 9.5 14.3*  --  18.4*  --  25.0* 17.6*  HGB 8.0* 9.2* 9.9* 9.3* 9.9* 9.7* 8.1*  HCT 23.7* 28.1* 29.0* 27.9* 29.0* 29.7* 25.1*  MCV 96.3 99.3  --  97.6  --  99.3 100.8*  PLT 86* 89*  --  98*  --  118* 140*    Basic Metabolic Panel: Recent Labs  Lab 02/12/24 0354 02/12/24 1316 02/12/24 1841 02/12/24 1846 02/13/24 0321 02/13/24 0459 02/13/24 1900 02/14/24 0431 02/14/24 1600 02/15/24 0344  NA 135   < >  --    < > 140  138 141 136 136  136 136 138  K 4.3   < >   --    < > 4.0  3.9 4.0 3.4* 4.1  4.1 4.3 4.0  CL 102   < >  --    < > 104  102  --  95* 102  99 100 101  CO2 21*   < >  --    < > 21*  21*  --  26 24  24 23 25   GLUCOSE 257*   < >  --    < > 111*  109*  --  95 124*  127* 117* 126*  BUN 40*   < >  --    < > 53*  54*  --  19 40*  41* 48* 54*  CREATININE 1.80*   < >  --    < > 2.28*  2.20*  --  1.17 2.29*  2.14* 2.94* 3.14*  CALCIUM  7.4*   < >  --    < > 7.8*  7.7*  --  8.7* 7.8*  7.9* 7.9* 7.8*  MG 2.5*  --  2.5*  --  2.6*  --   --  2.1  --  2.5*  PHOS 2.2*   < > 2.3*  --  2.8  --  1.5* 3.2 3.9  --    < > =  values in this interval not displayed.   GFR: Estimated Creatinine Clearance: 23.3 mL/min (A) (by C-G formula based on SCr of 3.14 mg/dL (H)). Recent Labs  Lab 02/08/24 1513 02/09/24 0437 02/12/24 1841 02/12/24 1853 02/13/24 0321 02/14/24 0431 02/15/24 0344  WBC 11.7*   < > 14.3*  --  18.4* 25.0* 17.6*  LATICACIDVEN 1.1  --   --  1.7  --   --   --    < > = values in this interval not displayed.    Liver Function Tests: Recent Labs  Lab 02/08/24 1513 02/09/24 0437 02/12/24 2022 02/13/24 0321 02/13/24 1900 02/14/24 0431 02/14/24 1600 02/15/24 0344  AST 31  --  112* 139*  --  139*  --  671*  ALT 19  --  39 47*  --  63*  --  249*  ALKPHOS 102  --  260* 278*  --  290*  --  435*  BILITOT 1.2  --  0.8 1.3*  --  1.7*  --  2.1*  PROT 7.7  --  7.4 7.7  --  7.5  --  7.2  ALBUMIN  2.3*   < > 2.0* 2.0*  2.0* 2.5* 1.8*  1.9* 1.8* 1.7*   < > = values in this interval not displayed.   No results for input(s): LIPASE, AMYLASE in the last 168 hours. No results for input(s): AMMONIA in the last 168 hours.  ABG    Component Value Date/Time   PHART 7.545 (H) 02/13/2024 0459   PCO2ART 27.7 (L) 02/13/2024 0459   PO2ART 151 (H) 02/13/2024 0459   HCO3 24.0 02/13/2024 0459   TCO2 25 02/13/2024 0459   ACIDBASEDEF 8.0 (H) 02/08/2024 1446   O2SAT 81.7 02/13/2024 1300      Past Medical History:  He,  has a past  medical history of Anemia, Arthritis, CAD in native artery, CHF (congestive heart failure) (HCC), CKD (chronic kidney disease) stage 3, GFR 30-59 ml/min (HCC), DDD (degenerative disc disease), cervical, Diabetes mellitus with complication (HCC), Diabetic neuropathy (HCC), Diabetic retinopathy (HCC), GERD (gastroesophageal reflux disease), Glaucoma, and Hypertension.   Surgical History:   Past Surgical History:  Procedure Laterality Date   AMPUTATION Right 07/09/2016   Procedure: Right Great Toe Amputation at Metatarsophalangeal Joint;  Surgeon: Jerona Harden GAILS, MD;  Location: [facility] OR;  Service: Orthopedics;  Laterality: Right;   AMPUTATION Right 05/28/2018   Procedure: RIGHT SECOND TOE AMPUTATION;  Surgeon: Harden Jerona GAILS, MD;  Location: [facility] OR;  Service: Orthopedics;  Laterality: Right;   AMPUTATION Left 08/03/2020   Procedure: LEFT 5TH RAY AMPUTATION;  Surgeon: Harden Jerona GAILS, MD;  Location: [facility] OR;  Service: Orthopedics;  Laterality: Left;   BACK SURGERY     4   CARDIAC CATHETERIZATION     CORONARY STENT INTERVENTION N/A 04/06/2017   Procedure: CORONARY STENT INTERVENTION;  Surgeon: Elmira Newman PARAS, MD;  Location: [facility];  Service: Cardiovascular;  Laterality: N/A;   CORONARY STENT INTERVENTION N/A 04/07/2017   Procedure: CORONARY STENT INTERVENTION;  Surgeon: Elmira Newman PARAS, MD;  Location: [facility];  Service: Cardiovascular;  Laterality: N/A;   CYST EXCISION     on Back   ENDOTRACHEAL INTUBATION EMERGENT  08/07/2018       ESOPHAGOGASTRODUODENOSCOPY Left 11/07/2023   Procedure: EGD (ESOPHAGOGASTRODUODENOSCOPY);  Surgeon: Rosalie Kitchens, MD;  Location: [facility];  Service: Gastroenterology;  Laterality: Left;   IR TUNNELED CENTRAL VENOUS CATH [facility] W IMG  02/08/2024   JOINT REPLACEMENT  LAPAROSCOPIC ABDOMINAL EXPLORATION N/A 11/26/2017   Procedure: LAPAROSCOPIC ABDOMINAL EXPLORATION, DRAINAGE OF APPENDICEAL ABCESS. PLACEMENT OF DRAIN;  Surgeon: Ethyl Lenis, MD;   Location: [facility] OR;  Service: General;  Laterality: N/A;   RIGHT/LEFT HEART CATH AND CORONARY ANGIOGRAPHY N/A 04/06/2017   Procedure: RIGHT/LEFT HEART CATH AND CORONARY ANGIOGRAPHY;  Surgeon: Elmira Newman PARAS, MD;  Location: [facility];  Service: Cardiovascular;  Laterality: N/A;   TOE SURGERY  05/2018   Left    TOTAL HIP ARTHROPLASTY     Right      Social History:   reports that he quit smoking about 29 years ago. His smoking use included cigarettes. He started smoking about 34 years ago. He has a 1.3 pack-year smoking history. He has been exposed to tobacco smoke. He has never used smokeless tobacco. He reports that he does not currently use alcohol. He reports that he does not use drugs.   Family History:  His family history includes Alzheimer's disease in his father and another family member; Diabetes in an other family member; Diabetes Mellitus II in his father; Hyperlipidemia in an other family member; Hypertension in an other family member; Stroke in an other family member; Thyroid  disease in his mother.   Allergies Allergies  Allergen Reactions   Nsaids Other (See Comments)    CKD stage 3      Home Medications  Prior to Admission medications   Medication Sig Start Date End Date Taking? Authorizing Provider  acetaminophen  (TYLENOL ) 500 MG tablet Take 2 tablets (1,000 mg total) by mouth in the morning and at bedtime. 11/21/23  Yes Barbarann Nest, MD  albuterol  (VENTOLIN  HFA) 108 [phone] Base) MCG/ACT inhaler Inhale 2 puffs into the lungs every 6 (six) hours as needed for wheezing or shortness of breath. 11/21/23  Yes Barbarann Nest, MD  ascorbic acid (VITAMIN C) 500 MG tablet Take 500 mg by mouth daily.   Yes [provider]  aspirin  EC 81 MG tablet Take 1 tablet (81 mg total) by mouth daily. 11/21/23  Yes Barbarann Nest, MD  atorvastatin  (LIPITOR ) 80 MG tablet Take 1 tablet (80 mg total) by mouth at bedtime. 11/21/23  Yes Barbarann Nest, MD  bisacodyl  (DULCOLAX) 10 MG  suppository Place 1 suppository (10 mg total) rectally daily as needed for moderate constipation. 11/21/23  Yes Barbarann Nest, MD  Cholecalciferol  (VITAMIN D3) 1.25 MG (50000 UT) CAPS Take 50,000 Units by mouth every 30 (thirty) days.   Yes [provider]  Cyanocobalamin  (VITAMIN B-12 CR) 1000 MCG TBCR Take 1,000 mcg by mouth every 14 (fourteen) days.   Yes [provider]  dextromethorphan -guaiFENesin  (MUCINEX  DM) 30-600 MG 12hr tablet Take 1 tablet by mouth 2 (two) times daily. 11/21/23  Yes Barbarann Nest, MD  dicyclomine  (BENTYL ) 10 MG capsule Take 1 capsule (10 mg total) by mouth in the morning, at noon, in the evening, and at bedtime. 11/21/23  Yes Barbarann Nest, MD  docusate sodium  (COLACE) 100 MG capsule Take 1 capsule (100 mg total) by mouth 2 (two) times daily. 11/21/23  Yes Barbarann Nest, MD  ferrous sulfate  325 (65 FE) MG tablet Take 1 tablet (325 mg total) by mouth daily with breakfast. 11/21/23  Yes Barbarann Nest, MD  gabapentin  (NEURONTIN ) 100 MG capsule Take 2 capsules (200 mg total) by mouth 3 (three) times daily. Patient taking differently: Take 200 mg by mouth 2 (two) times daily. 11/21/23  Yes Barbarann Nest, MD  insulin  aspart (NOVOLOG ) 100 UNIT/ML injection Inject 2-12 Units into the  skin in the morning and at bedtime. Per sliding scale, 201-250= 2 units, 251-300= 4 units, 301-350= 6 units, 351-400= 8 units, 401-450= 10 units, 451-500= 12 units, if greater than 400 call MD 01/06/24  Yes Cheryle Page, MD  isosorbide -hydrALAZINE  (BIDIL ) 20-37.5 MG tablet Take 2 tablets by mouth three times daily. 11/25/23  Yes Patwardhan, Manish J, MD  latanoprost  (XALATAN ) 0.005 % ophthalmic solution Place 1 drop into both eyes at bedtime. 11/21/23  Yes Barbarann Nest, MD  lubiprostone  (AMITIZA ) 24 MCG capsule Take 1 capsule (24 mcg total) by mouth daily. 11/21/23  Yes Barbarann Nest, MD  melatonin 3 MG TABS tablet Take 1 tablet (3 mg total) by mouth at bedtime. 11/21/23  Yes Barbarann Nest, MD  methocarbamol  (ROBAXIN ) 500 MG tablet Take 1 tablet (500 mg total) by mouth every 6 (six) hours as needed for muscle spasms. 11/21/23  Yes Barbarann Nest, MD  metoCLOPramide  (REGLAN ) 5 MG tablet Take 1 tablet (5 mg total) by mouth 3 (three) times daily. 11/21/23 11/20/24 Yes Barbarann Nest, MD  Multiple Vitamins-Minerals (CERTAVITE SENIOR) TABS Take 1 tablet by mouth daily. 11/21/23  Yes Barbarann Nest, MD  nitroGLYCERIN  (NITROSTAT ) 0.4 MG SL tablet Place 1 tablet (0.4 mg total) under the tongue every 5 (five) minutes as needed for chest pain. 11/21/23  Yes Barbarann Nest, MD  nutrition supplement, JUVEN, (JUVEN) PACK Take 1 packet by mouth 2 (two) times daily between meals.   Yes [provider]  ondansetron  (ZOFRAN ) 4 MG tablet Take 1 tablet (4 mg total) by mouth every 6 (six) hours as needed for nausea or vomiting. 11/21/23  Yes Barbarann Nest, MD  OXYGEN Inhale 3 L/min into the lungs continuous.   Yes [provider]  pantoprazole  (PROTONIX ) 40 MG tablet Take 1 tablet (40 mg total) by mouth daily. 11/21/23  Yes Barbarann Nest, MD  polyethylene glycol (MIRALAX  / GLYCOLAX ) 17 g packet Take 17 g by mouth daily as needed for mild constipation or moderate constipation.   Yes [provider]  polyethylene glycol powder (GLYCOLAX /MIRALAX ) 17 GM/SCOOP powder Take 17 g by mouth daily. PRN order: 17 g by mouth daily as needed for constipation Patient taking differently: Take 17 g by mouth daily. 11/21/23  Yes Barbarann Nest, MD  pyridoxine  (B-6) 100 MG tablet Take 1 tablet (100 mg total) by mouth daily. 11/21/23  Yes Barbarann Nest, MD  sertraline  (ZOLOFT ) 25 MG tablet Take 1 tablet (25 mg total) by mouth daily. 11/21/23  Yes Barbarann Nest, MD  sodium chloride  (OCEAN) 0.65 % SOLN nasal spray Place 1 spray into both nostrils as needed for congestion. Patient taking differently: Place 1 spray into both nostrils daily as needed for congestion. 11/21/23  Yes Barbarann Nest, MD   traMADol  (ULTRAM ) 50 MG tablet Take 1 tablet (50 mg total) by mouth every 12 (twelve) hours as needed for moderate pain (pain score 4-6) or severe pain (pain score 7-10). 01/07/24  Yes Cheryle Page, MD  traZODone  (DESYREL ) 100 MG tablet Take 1 tablet (100 mg total) by mouth at bedtime. 11/21/23  Yes Barbarann Nest, MD  ZINC OXIDE, TOPICAL, EX Apply 1 application  topically in the morning and at bedtime. Apply to buttocks topically every day and night shift   Yes [provider]     CRITICAL CARE Performed by: Sammi JONETTA Fredericks.     Total critical care time: 45 minutes   Critical care time was exclusive of separately billable procedures and treating other patients.   Critical care was  necessary to treat or prevent imminent or life-threatening deterioration.   Critical care was time spent personally by me on the following activities: development of treatment plan with patient and/or surrogate as well as nursing, discussions with consultants, evaluation of patient's response to treatment, examination of patient, obtaining history from patient or surrogate, ordering and performing treatments and interventions, ordering and review of laboratory studies, ordering and review of radiographic studies, pulse oximetry, re-evaluation of patient's condition and participation in multidisciplinary rounds.  Sammi JONETTA Fredericks, MD Pulmonary, Critical Care and Sleep Attending.  Pager: [phone]  02/15/2024, 12:26 PM

## 2024-02-15 NOTE — Evaluation (Signed)
 Clinical/Bedside Swallow Evaluation Patient Details  Name: David [name] MRN: [id number] Date of Birth: [birth year]  Today's Date: 02/15/2024 Time: SLP Start Time (ACUTE ONLY): 1300 SLP Stop Time (ACUTE ONLY): 1310 SLP Time Calculation (min) (ACUTE ONLY): 10 min  Past Medical History:  Past Medical History:  Diagnosis Date   Anemia    Arthritis    CAD in native artery    s/p stent in 11/18   CHF (congestive heart failure) (HCC)    normal echo in 11/18   CKD (chronic kidney disease) stage 3, GFR 30-59 ml/min (HCC)    DDD (degenerative disc disease), cervical    Diabetes mellitus with complication (HCC)    Type II   Diabetic neuropathy (HCC)    Diabetic retinopathy (HCC)    GERD (gastroesophageal reflux disease)    Glaucoma    Hypertension    Past Surgical History:  Past Surgical History:  Procedure Laterality Date   AMPUTATION Right 07/09/2016   Procedure: Right Great Toe Amputation at Metatarsophalangeal Joint;  Surgeon: Jerona Harden GAILS, MD;  Location: [facility] OR;  Service: Orthopedics;  Laterality: Right;   AMPUTATION Right 05/28/2018   Procedure: RIGHT SECOND TOE AMPUTATION;  Surgeon: Harden Jerona GAILS, MD;  Location: [facility] OR;  Service: Orthopedics;  Laterality: Right;   AMPUTATION Left 08/03/2020   Procedure: LEFT 5TH RAY AMPUTATION;  Surgeon: Harden Jerona GAILS, MD;  Location: [facility] OR;  Service: Orthopedics;  Laterality: Left;   BACK SURGERY     4   CARDIAC CATHETERIZATION     CORONARY STENT INTERVENTION N/A 04/06/2017   Procedure: CORONARY STENT INTERVENTION;  Surgeon: Elmira Newman PARAS, MD;  Location: [facility];  Service: Cardiovascular;  Laterality: N/A;   CORONARY STENT INTERVENTION N/A 04/07/2017   Procedure: CORONARY STENT INTERVENTION;  Surgeon: Elmira Newman PARAS, MD;  Location: [facility];  Service: Cardiovascular;  Laterality: N/A;   CYST EXCISION     on Back   ENDOTRACHEAL INTUBATION EMERGENT  08/07/2018       ESOPHAGOGASTRODUODENOSCOPY Left 11/07/2023    Procedure: EGD (ESOPHAGOGASTRODUODENOSCOPY);  Surgeon: Rosalie Kitchens, MD;  Location: [facility];  Service: Gastroenterology;  Laterality: Left;   IR TUNNELED CENTRAL VENOUS CATH PLC W IMG  02/08/2024   JOINT REPLACEMENT     LAPAROSCOPIC ABDOMINAL EXPLORATION N/A 11/26/2017   Procedure: LAPAROSCOPIC ABDOMINAL EXPLORATION, DRAINAGE OF APPENDICEAL ABCESS. PLACEMENT OF DRAIN;  Surgeon: Ethyl Lenis, MD;  Location: [facility] OR;  Service: General;  Laterality: N/A;   RIGHT/LEFT HEART CATH AND CORONARY ANGIOGRAPHY N/A 04/06/2017   Procedure: RIGHT/LEFT HEART CATH AND CORONARY ANGIOGRAPHY;  Surgeon: Elmira Newman PARAS, MD;  Location: [facility];  Service: Cardiovascular;  Laterality: N/A;   TOE SURGERY  05/2018   Left    TOTAL HIP ARTHROPLASTY     Right    HPI:  Pt is a 78 y.o. male who presented from SNF with worsening SOB for two weeks. Dx HF exacerbation and AKI. 9/22 PEA arrest during dialysis placement in IR; Intubated 9/22; 9/22 second PEA arrest; CRRT; extubated 9/28 (total ETT days 6); not managing secretions post-extubation and SLP consulted 9/29. Initial swallow assessment 9/21 with recs for dysphagia 1/nectar liquids.  PMH: combined systolic and diastolic CHF with reduced EF of 30 to 35%, essential hypertension, CKD stage IV, chronic microcytic anemia, CAD s/p coronary stents, diabetes mellitus type 2, chronic diabetic foot ulcers in bilateral lower extremities with amputations, EGD 10/2023.    Assessment / Plan / Recommendation  Clinical Impression  Pt participated in limited swallowing assessment due to mental status. He was lethargic, not following commands, but awakened briefly during oral care.  He intermittently bit down on suctioning catheter.  Briefly accepted ice chips before letting them fall from his mouth without awareness; bit down on spoon, requiring some effort to extract from his mouth.  No spontaneous swallowing was observed. Recommend continue NPO with cortrak feeds; SLP  will follow for improvements in mentation that will allow PO trials. D/W RN.  SLP Visit Diagnosis: Dysphagia, unspecified (R13.10)    Aspiration Risk  Other (comment) (unknowm)    Diet Recommendation   NPO  Medication Administration: Via alternative means    Other  Recommendations Oral Care Recommendations: Oral care QID     Assistance Recommended at Discharge    Functional Status Assessment Patient has had a recent decline in their functional status and demonstrates the ability to make significant improvements in function in a reasonable and predictable amount of time.  Frequency and Duration min 2x/week  2 weeks       Prognosis Prognosis for improved oropharyngeal function: Fair Barriers to Reach Goals: Cognitive deficits;Severity of deficits      Swallow Study   General Date of Onset: 02/06/24 HPI: Pt is a 78 y.o. male who presented from SNF with worsening SOB for two weeks. Dx HF exacerbation and AKI. 9/22 PEA arrest during dialysis placement in IR; Intubated 9/22; 9/22 second PEA arrest; CRRT; extubated 9/28 (total ETT days 6); not managing secretions post-extubation and SLP consulted 9/29. Initial swallow assessment 9/21 with recs for dysphagia 1/nectar liquids.  PMH: combined systolic and diastolic CHF with reduced EF of 30 to 35%, essential hypertension, CKD stage IV, chronic microcytic anemia, CAD s/p coronary stents, diabetes mellitus type 2, chronic diabetic foot ulcers in bilateral lower extremities. Type of Study: Bedside Swallow Evaluation Previous Swallow Assessment: see HPI Diet Prior to this Study: NPO Temperature Spikes Noted: No Respiratory Status: Nasal cannula History of Recent Intubation: Yes Total duration of intubation (days): 5 days Date extubated: 02/14/24 Behavior/Cognition: Lethargic/Drowsy Oral Cavity Assessment: Within Functional Limits Oral Care Completed by SLP: Yes Oral Cavity - Dentition: Adequate natural dentition Self-Feeding Abilities:  Total assist Patient Positioning: Upright in bed Baseline Vocal Quality: Low vocal intensity Volitional Cough: Cognitively unable to elicit Volitional Swallow: Unable to elicit    Oral/Motor/Sensory Function Overall Oral Motor/Sensory Function: Other (comment) (no obvious deficits at baseline; not f/c)   Ice Chips Ice chips: Impaired Presentation: Spoon Oral Phase Impairments: Poor awareness of bolus Oral Phase Functional Implications: Right anterior spillage;Left anterior spillage;Oral holding   Thin Liquid Thin Liquid: Not tested    Nectar Thick Nectar Thick Liquid: Not tested   Honey Thick Honey Thick Liquid: Not tested   Puree Puree: Not tested   Solid    Renan Danese L. [name], MA CCC/SLP Clinical Specialist - Acute Care SLP Acute Rehabilitation Services Office number [phone]  Solid: Not tested      [name] Palma Laurice 02/15/2024,1:20 PM

## 2024-02-15 NOTE — Plan of Care (Signed)
  Problem: Clinical Measurements: Goal: Ability to maintain clinical measurements within normal limits will improve Outcome: Progressing Goal: Will remain free from infection Outcome: Progressing Goal: Diagnostic test results will improve Outcome: Progressing Goal: Respiratory complications will improve Outcome: Progressing Goal: Cardiovascular complication will be avoided Outcome: Progressing   Problem: Activity: Goal: Risk for activity intolerance will decrease Outcome: Progressing   Problem: Elimination: Goal: Will not experience complications related to bowel motility Outcome: Progressing Goal: Will not experience complications related to urinary retention Outcome: Progressing   Problem: Pain Managment: Goal: General experience of comfort will improve and/or be controlled Outcome: Progressing   Problem: Safety: Goal: Ability to remain free from injury will improve Outcome: Progressing   Problem: Skin Integrity: Goal: Risk for impaired skin integrity will decrease Outcome: Progressing   Problem: Activity: Goal: Capacity to carry out activities will improve Outcome: Progressing   Problem: Cardiac: Goal: Ability to achieve and maintain adequate cardiopulmonary perfusion will improve Outcome: Progressing   Problem: Coping: Goal: Ability to adjust to condition or change in health will improve Outcome: Progressing   Problem: Metabolic: Goal: Ability to maintain appropriate glucose levels will improve Outcome: Progressing   Problem: Skin Integrity: Goal: Risk for impaired skin integrity will decrease Outcome: Progressing   Problem: Tissue Perfusion: Goal: Adequacy of tissue perfusion will improve Outcome: Progressing   Problem: Respiratory: Goal: Ability to maintain a clear airway and adequate ventilation will improve Outcome: Progressing   Problem: Safety: Goal: Non-violent Restraint(s) Outcome: Progressing

## 2024-02-15 NOTE — Progress Notes (Signed)
 Admit: 02/03/2024 LOS: 12  86M with AKI on CKD4 vs progressive CKD; Cardiac arrest x2 02/08/24   Subjective:  No major events reported overnight.  Remains somnolent.  On 2L [place].  Receiving antibiotics. K4.0, bicarbonate 25 30ml urine output yesterday, decline.  Has left femoral temporary HD catheter Plan HD today  09/28 0701 - 09/29 0700 In: 200 [IV Piggyback:200] Out: 30 [Urine:30]  Filed Weights   02/13/24 1534 02/13/24 1915 02/14/24 0256  Weight: 87.5 kg 84.5 kg 82.5 kg    Scheduled Meds:  aspirin   81 mg Per Tube Daily   atorvastatin   80 mg Per Tube QHS   Chlorhexidine  Gluconate Cloth  6 each Topical Daily   Chlorhexidine  Gluconate Cloth  6 each Topical Q0600   Chlorhexidine  Gluconate Cloth  6 each Topical Q0600   darbepoetin (ARANESP ) injection - DIALYSIS  60 mcg Subcutaneous Q Thu-1800   docusate  100 mg Per Tube BID   feeding supplement (PROSource TF20)  60 mL Per Tube BID   heparin   5,000 Units Subcutaneous Q8H   insulin  aspart  0-20 Units Subcutaneous Q4H   latanoprost   1 drop Both Eyes QHS   linezolid   600 mg Per Tube Q12H   multivitamin with minerals  1 tablet Per Tube Daily   nutrition supplement (JUVEN)  1 packet Per Tube BID BM   mouth rinse  15 mL Mouth Rinse Q2H   scopolamine  1 patch Transdermal Q72H   sertraline   25 mg Per Tube Daily   sodium chloride  flush  3 mL Intravenous Q12H   traZODone   100 mg Per Tube QHS   Continuous Infusions:  ampicillin -sulbactam (UNASYN ) IV Stopped (02/14/24 2149)   anticoagulant sodium citrate     anticoagulant sodium citrate     feeding supplement (VITAL 1.5 CAL) 1,000 mL (02/14/24 1456)   PRN Meds:.acetaminophen  **OR** acetaminophen , alteplase, alteplase, anticoagulant sodium citrate, anticoagulant sodium citrate, bisacodyl , fentaNYL  (SUBLIMAZE ) injection, heparin , heparin , lidocaine  (PF), lidocaine -prilocaine, midazolam , mouth rinse, pentafluoroprop-tetrafluoroeth, prochlorperazine , sodium chloride  flush  Current Labs:  reviewed   Physical Exam:  Blood pressure (!) 99/50, pulse 82, temperature 98.1 F (36.7 C), temperature source Axillary, resp. rate 19, height 6' 2 (1.88 m), weight 82.5 kg, SpO2 100%. Intubated and sedated Regular: No rub, normal S1-S2 Coarse breath sounds bilaterally Soft, nontender Lower extremity wounds bandaged Left femoral temporary HD catheter bandaged, clean and intact  A AKI on CKD 4 versus progression of CKD, starting HD during admission on 9/22, off CRRT 9/25; at this point he is likely ESRD PEA arrest 02/08/2024 x2; subsequent intermittent bradycardia episodes Urinary retention status post Foley and on tamsulosin , now DC'd Hypertension: Blood pressures have normalized after cardiac arrest Metabolic acidosis: Improved with dialysis Anemia, transfusion per CCM, on ESA Foot wounds and osteomyelitis of the fifth proximal phalanx of fifth metatarsal head on the right side on linezolid  and Unasyn  with orthopedics following Chronic DM2 Hypophosphatemia should improve now that CRRT is stopped.  Will recheck Severe pulmonary hypertension followed by CCM and plan for right heart catheterization  P Continue optimization of volume status with HD today in the ICU, at least 3 L UF will be the target Keep femoral temporary HD catheter pending optimization of cardiac status, as his cardiac arrest occurred during attempted tunneled dialysis catheter placement Once more stable can evaluate for AV fistula/graft as well. Replace phosphorus per primary Medication Issues; Preferred narcotic agents for pain control are hydromorphone , fentanyl , and methadone. Morphine  should not be used.  Baclofen should be avoided Avoid  oral sodium phosphate  and magnesium  citrate based laxatives / bowel preps   Discussed with primary RN  Dr. Evalene HERO Gaylia Kassel  02/15/2024, 7:13 AM  Recent Labs  Lab 02/13/24 1900 02/14/24 0431 02/14/24 1600 02/15/24 0344  NA 136 136  136 136 138  K 3.4* 4.1  4.1 4.3  4.0  CL 95* 102  99 100 101  CO2 26 24  24 23 25   GLUCOSE 95 124*  127* 117* 126*  BUN 19 40*  41* 48* 54*  CREATININE 1.17 2.29*  2.14* 2.94* 3.14*  CALCIUM  8.7* 7.8*  7.9* 7.9* 7.8*  PHOS 1.5* 3.2 3.9  --    Recent Labs  Lab 02/13/24 0321 02/13/24 0459 02/14/24 0431 02/15/24 0344  WBC 18.4*  --  25.0* 17.6*  HGB 9.3* 9.9* 9.7* 8.1*  HCT 27.9* 29.0* 29.7* 25.1*  MCV 97.6  --  99.3 100.8*  PLT 98*  --  118* 140*

## 2024-02-15 NOTE — Progress Notes (Signed)
 PT Cancellation Note  Patient Details Name: David [name] MRN: [id number] DOB: 1945/12/18   Cancelled Treatment:    Reason Eval/Treat Not Completed: Other (comment); patient seen prior to extubation and noted to be not following commands and somewhat resistant.  Patient total A at baseline at LTC facility.  New order received due to extubation.  Will sign off as pt not appropriate for skilled PT services.  Thank you.   [name] Portal 02/15/2024, 9:16 AM Micheline Portal, PT Acute Rehabilitation Services [phone] 02/15/2024

## 2024-02-16 ENCOUNTER — Inpatient Hospital Stay (HOSPITAL_COMMUNITY)

## 2024-02-16 ENCOUNTER — Encounter (HOSPITAL_COMMUNITY): Payer: Self-pay | Admitting: Family Medicine

## 2024-02-16 DIAGNOSIS — Z794 Long term (current) use of insulin: Secondary | ICD-10-CM

## 2024-02-16 DIAGNOSIS — E1122 Type 2 diabetes mellitus with diabetic chronic kidney disease: Secondary | ICD-10-CM

## 2024-02-16 DIAGNOSIS — I5033 Acute on chronic diastolic (congestive) heart failure: Secondary | ICD-10-CM | POA: Diagnosis not present

## 2024-02-16 DIAGNOSIS — M86161 Other acute osteomyelitis, right tibia and fibula: Secondary | ICD-10-CM

## 2024-02-16 DIAGNOSIS — I5043 Acute on chronic combined systolic (congestive) and diastolic (congestive) heart failure: Secondary | ICD-10-CM

## 2024-02-16 DIAGNOSIS — I130 Hypertensive heart and chronic kidney disease with heart failure and stage 1 through stage 4 chronic kidney disease, or unspecified chronic kidney disease: Secondary | ICD-10-CM

## 2024-02-16 LAB — RENAL FUNCTION PANEL
Albumin: 2.1 g/dL — ABNORMAL LOW (ref 3.5–5.0)
Anion gap: 14 (ref 5–15)
BUN: 46 mg/dL — ABNORMAL HIGH (ref 8–23)
CO2: 25 mmol/L (ref 22–32)
Calcium: 8.1 mg/dL — ABNORMAL LOW (ref 8.9–10.3)
Chloride: 97 mmol/L — ABNORMAL LOW (ref 98–111)
Creatinine, Ser: 2.66 mg/dL — ABNORMAL HIGH (ref 0.61–1.24)
GFR, Estimated: 24 mL/min — ABNORMAL LOW (ref >60)
Glucose, Bld: 99 mg/dL (ref 70–99)
Phosphorus: 2.5 mg/dL (ref 2.5–4.6)
Potassium: 3.7 mmol/L (ref 3.5–5.1)
Sodium: 136 mmol/L (ref 135–145)

## 2024-02-16 LAB — POCT I-STAT 7, (LYTES, BLD GAS, ICA,H+H)
Acid-Base Excess: 3 mmol/L — ABNORMAL HIGH (ref 0.0–2.0)
Acid-Base Excess: 0 mmol/L (ref 0.0–2.0)
Bicarbonate: 25.6 mmol/L (ref 20.0–28.0)
Bicarbonate: 23.7 mmol/L (ref 20.0–28.0)
Calcium, Ion: 1.09 mmol/L — ABNORMAL LOW (ref 1.15–1.40)
Calcium, Ion: 1.1 mmol/L — ABNORMAL LOW (ref 1.15–1.40)
HCT: 24 % — ABNORMAL LOW (ref 39.0–52.0)
HCT: 29 % — ABNORMAL LOW (ref 39.0–52.0)
Hemoglobin: 8.2 g/dL — ABNORMAL LOW (ref 13.0–17.0)
Hemoglobin: 9.9 g/dL — ABNORMAL LOW (ref 13.0–17.0)
O2 Saturation: 100 %
O2 Saturation: 97 %
Patient temperature: 98.6
Patient temperature: 97.7
Potassium: 3.3 mmol/L — ABNORMAL LOW (ref 3.5–5.1)
Potassium: 3.3 mmol/L — ABNORMAL LOW (ref 3.5–5.1)
Sodium: 138 mmol/L (ref 135–145)
Sodium: 140 mmol/L (ref 135–145)
TCO2: 27 mmol/L (ref 22–32)
TCO2: 25 mmol/L (ref 22–32)
pCO2 arterial: 31.3 mmHg — ABNORMAL LOW (ref 32–48)
pCO2 arterial: 33.6 mmHg (ref 32–48)
pH, Arterial: 7.521 — ABNORMAL HIGH (ref 7.35–7.45)
pH, Arterial: 7.455 — ABNORMAL HIGH (ref 7.35–7.45)
pO2, Arterial: 287 mmHg — ABNORMAL HIGH (ref 83–108)
pO2, Arterial: 86 mmHg (ref 83–108)

## 2024-02-16 LAB — HEPATIC FUNCTION PANEL
ALT: 235 U/L — ABNORMAL HIGH (ref 0–44)
AST: 574 U/L — ABNORMAL HIGH (ref 15–41)
Albumin: 2 g/dL — ABNORMAL LOW (ref 3.5–5.0)
Alkaline Phosphatase: 478 U/L — ABNORMAL HIGH (ref 38–126)
Bilirubin, Direct: 1.4 mg/dL — ABNORMAL HIGH (ref 0.0–0.2)
Indirect Bilirubin: 1.2 mg/dL — ABNORMAL HIGH (ref 0.3–0.9)
Total Bilirubin: 2.6 mg/dL — ABNORMAL HIGH (ref 0.0–1.2)
Total Protein: 7.3 g/dL (ref 6.5–8.1)

## 2024-02-16 LAB — CBC
HCT: 23.5 % — ABNORMAL LOW (ref 39.0–52.0)
Hemoglobin: 7.6 g/dL — ABNORMAL LOW (ref 13.0–17.0)
MCH: 33.2 pg (ref 26.0–34.0)
MCHC: 32.3 g/dL (ref 30.0–36.0)
MCV: 102.6 fL — ABNORMAL HIGH (ref 80.0–100.0)
Platelets: 142 K/uL — ABNORMAL LOW (ref 150–400)
RBC: 2.29 MIL/uL — ABNORMAL LOW (ref 4.22–5.81)
RDW: 27.7 % — ABNORMAL HIGH (ref 11.5–15.5)
WBC: 9.2 K/uL (ref 4.0–10.5)
nRBC: 1.2 % — ABNORMAL HIGH (ref 0.0–0.2)

## 2024-02-16 LAB — GLUCOSE, CAPILLARY
Glucose-Capillary: 104 mg/dL — ABNORMAL HIGH (ref 70–99)
Glucose-Capillary: 103 mg/dL — ABNORMAL HIGH (ref 70–99)
Glucose-Capillary: 107 mg/dL — ABNORMAL HIGH (ref 70–99)
Glucose-Capillary: 109 mg/dL — ABNORMAL HIGH (ref 70–99)
Glucose-Capillary: 104 mg/dL — ABNORMAL HIGH (ref 70–99)
Glucose-Capillary: 147 mg/dL — ABNORMAL HIGH (ref 70–99)

## 2024-02-16 LAB — HEPATITIS PANEL, ACUTE
HCV Ab: NONREACTIVE
Hep A IgM: NONREACTIVE
Hep B C IgM: NONREACTIVE
Hepatitis B Surface Ag: NONREACTIVE

## 2024-02-16 MED ORDER — HEPARIN SODIUM (PORCINE) 5000 UNIT/ML IJ SOLN
5000.0000 [IU] | Freq: Three times a day (TID) | INTRAMUSCULAR | Status: DC
Start: 2024-02-17 — End: 2024-02-17

## 2024-02-16 MED ORDER — NOREPINEPHRINE 4 MG/250ML-% IV SOLN
0.0000 ug/min | INTRAVENOUS | Status: DC
  Administered 2024-02-16: 2 ug/min via INTRAVENOUS
  Filled 2024-02-16: qty 250

## 2024-02-16 MED ORDER — PROPOFOL 1000 MG/100ML IV EMUL
0.0000 ug/kg/min | INTRAVENOUS | Status: DC
  Administered 2024-02-16 (×2): 10 ug/kg/min via INTRAVENOUS
  Filled 2024-02-16 (×2): qty 100

## 2024-02-16 MED ORDER — DOCUSATE SODIUM 50 MG/5ML PO LIQD
100.0000 mg | Freq: Two times a day (BID) | ORAL | Status: DC
  Administered 2024-02-16: 100 mg
  Filled 2024-02-16: qty 10

## 2024-02-16 MED ORDER — SUCCINYLCHOLINE CHLORIDE 200 MG/10ML IV SOSY
PREFILLED_SYRINGE | INTRAVENOUS | Status: AC
  Filled 2024-02-16: qty 10

## 2024-02-16 MED ORDER — SODIUM CHLORIDE 0.9 % IV SOLN
250.0000 mL | INTRAVENOUS | Status: AC
  Administered 2024-02-16: 250 mL via INTRAVENOUS

## 2024-02-16 MED ORDER — CHLORHEXIDINE GLUCONATE CLOTH 2 % EX PADS
6.0000 | MEDICATED_PAD | Freq: Every day | CUTANEOUS | Status: DC
  Administered 2024-02-17: 6 via TOPICAL

## 2024-02-16 MED ORDER — ETOMIDATE 2 MG/ML IV SOLN: 10.0000 mg | Freq: Once | INTRAVENOUS | Status: AC

## 2024-02-16 MED ORDER — ETOMIDATE 2 MG/ML IV SOLN
INTRAVENOUS | Status: AC
  Administered 2024-02-16: 10 mg via INTRAVENOUS
  Filled 2024-02-16: qty 10

## 2024-02-16 MED ORDER — POLYETHYLENE GLYCOL 3350 17 G PO PACK
17.0000 g | PACK | Freq: Every day | ORAL | Status: DC
  Administered 2024-02-16: 17 g
  Filled 2024-02-16: qty 1

## 2024-02-16 MED ORDER — FENTANYL CITRATE PF 50 MCG/ML IJ SOSY: 50.0000 ug | PREFILLED_SYRINGE | INTRAMUSCULAR | Status: DC | PRN

## 2024-02-16 MED ORDER — PHENYLEPHRINE 80 MCG/ML (10ML) SYRINGE FOR IV PUSH (FOR BLOOD PRESSURE SUPPORT)
PREFILLED_SYRINGE | INTRAVENOUS | Status: AC
  Administered 2024-02-16: 160 ug via INTRAVENOUS
  Filled 2024-02-16: qty 10

## 2024-02-16 MED ORDER — ROCURONIUM BROMIDE 10 MG/ML (PF) SYRINGE
PREFILLED_SYRINGE | INTRAVENOUS | Status: AC
  Filled 2024-02-16: qty 10

## 2024-02-16 MED ORDER — FENTANYL CITRATE PF 50 MCG/ML IJ SOSY
PREFILLED_SYRINGE | INTRAMUSCULAR | Status: AC
  Filled 2024-02-16: qty 1

## 2024-02-16 MED ORDER — MIDAZOLAM HCL 2 MG/2ML IJ SOLN
INTRAMUSCULAR | Status: AC
  Administered 2024-02-16: 2 mg via INTRAVENOUS
  Filled 2024-02-16: qty 2

## 2024-02-16 MED ORDER — PHENYLEPHRINE 80 MCG/ML (10ML) SYRINGE FOR IV PUSH (FOR BLOOD PRESSURE SUPPORT): 80.0000 ug | PREFILLED_SYRINGE | Freq: Once | INTRAVENOUS | Status: AC | PRN

## 2024-02-16 MED ORDER — DEXMEDETOMIDINE HCL IN NACL 400 MCG/100ML IV SOLN: 0.0000 ug/kg/h | INTRAVENOUS | Status: DC

## 2024-02-16 MED ORDER — MIDAZOLAM HCL 2 MG/2ML IJ SOLN: 2.0000 mg | Freq: Once | INTRAMUSCULAR | Status: AC

## 2024-02-16 NOTE — Progress Notes (Signed)
 Admit: 02/03/2024 LOS: 13  35M with AKI on CKD4 vs progressive CKD; Cardiac arrest x2 02/08/24   Subjective:  HD yesterday: 3.5 hours, UF 2.5L, received albumin ; stable K3.7/3.3, bicarbonate 25 25ml urine output yesterday, decline/stable.  Has left femoral temporary HD catheter Aspiration event overnight requiring intubation.  09/29 0701 - 09/30 0700 In: 690.1 [I.V.:138.1; NG/GT:320.5; IV Piggyback:231.4] Out: 2525 [Urine:25]  Filed Weights   02/14/24 0256 02/15/24 0703 02/15/24 1445  Weight: 82.5 kg 82.5 kg 82.5 kg    Scheduled Meds:  aspirin   81 mg Per Tube Daily   Chlorhexidine  Gluconate Cloth  6 each Topical Daily   Chlorhexidine  Gluconate Cloth  6 each Topical Q0600   Chlorhexidine  Gluconate Cloth  6 each Topical Q0600   darbepoetin (ARANESP ) injection - DIALYSIS  60 mcg Subcutaneous Q Thu-1800   docusate  100 mg Per Tube BID   feeding supplement (PROSource TF20)  60 mL Per Tube BID   fentaNYL        heparin   5,000 Units Subcutaneous Q8H   insulin  aspart  0-20 Units Subcutaneous Q4H   latanoprost   1 drop Both Eyes QHS   multivitamin with minerals  1 tablet Per Tube Daily   nutrition supplement (JUVEN)  1 packet Per Tube BID BM   mouth rinse  15 mL Mouth Rinse Q2H   polyethylene glycol  17 g Per Tube Daily   sertraline   25 mg Per Tube Daily   sodium chloride  flush  3 mL Intravenous Q12H   vancomycin  variable dose per unstable renal function (pharmacist dosing)   Does not apply See admin instructions   Continuous Infusions:  sodium chloride  250 mL (02/16/24 0646)   anticoagulant sodium citrate     anticoagulant sodium citrate     ceFEPime  (MAXIPIME ) IV     dexmedetomidine  (PRECEDEX ) IV infusion     feeding supplement (VITAL 1.5 CAL) 30 mL/hr at 02/15/24 0757   metronidazole  Stopped (02/15/24 2227)   norepinephrine  (LEVOPHED ) Adult infusion     propofol  (DIPRIVAN ) infusion 10 mcg/kg/min (02/16/24 0635)   PRN Meds:.acetaminophen  **OR** acetaminophen , alteplase,  alteplase, anticoagulant sodium citrate, anticoagulant sodium citrate, bisacodyl , fentaNYL , fentaNYL  (SUBLIMAZE ) injection, fentaNYL  (SUBLIMAZE ) injection, heparin , heparin , lidocaine  (PF), lidocaine -prilocaine, midazolam , mouth rinse, pentafluoroprop-tetrafluoroeth, prochlorperazine , sodium chloride  flush  Current Labs: reviewed   Physical Exam:  Blood pressure (!) 87/48, pulse 73, temperature 98.4 F (36.9 C), temperature source Axillary, resp. rate 20, height 6' 2 (1.88 m), weight 82.5 kg, SpO2 100%.  Intubated/sedated RRR BLBS, mechanical Soft, nontender Lower extremity wounds bandaged Left femoral temporary HD catheter bandaged, clean and intact  A AKI on CKD 4 versus progression of CKD, starting HD during admission on 9/22, off CRRT 9/25; at this point he is likely ESRD PEA arrest 02/08/2024 x2; subsequent intermittent bradycardia episodes Urinary retention status post Foley and on tamsulosin , now DC'd Hypertension: Blood pressures have normalized after cardiac arrest Metabolic acidosis: Improved with dialysis Anemia, transfusion per CCM, on ESA Foot wounds and osteomyelitis of the fifth proximal phalanx of fifth metatarsal head on the right side on linezolid  and Unasyn  with orthopedics following Chronic DM2 Hypophosphatemia should improve now that CRRT is stopped.  Will recheck Severe pulmonary hypertension followed by CCM and plan for right heart catheterization Acute hypoxemic respiratory failure: Intubated 9/30  P Continue optimization of volume status with HD tomorrow in the ICU vs CRRT Keep femoral temporary HD catheter pending optimization of cardiac status, as his cardiac arrest occurred during attempted tunneled dialysis catheter placement Once more stable can  evaluate for AV fistula/graft as well. Replace phosphorus per primary Medication Issues; Preferred narcotic agents for pain control are hydromorphone , fentanyl , and methadone. Morphine  should not be used.   Baclofen should be avoided Avoid oral sodium phosphate  and magnesium  citrate based laxatives / bowel preps   Discussed with primary RN, primary team  Dr. Evalene HERO Ajahni Nay  02/16/2024, 7:40 AM  Recent Labs  Lab 02/14/24 0431 02/14/24 1600 02/15/24 0344 02/16/24 0531 02/16/24 0650  NA 136  136 136 138 136 138  K 4.1  4.1 4.3 4.0 3.7 3.3*  CL 102  99 100 101 97*  --   CO2 24  24 23 25 25   --   GLUCOSE 124*  127* 117* 126* 99  --   BUN 40*  41* 48* 54* 46*  --   CREATININE 2.29*  2.14* 2.94* 3.14* 2.66*  --   CALCIUM  7.8*  7.9* 7.9* 7.8* 8.1*  --   PHOS 3.2 3.9  --  2.5  --    Recent Labs  Lab 02/14/24 0431 02/15/24 0344 02/16/24 0531 02/16/24 0650  WBC 25.0* 17.6* 9.2  --   HGB 9.7* 8.1* 7.6* 8.2*  HCT 29.7* 25.1* 23.5* 24.0*  MCV 99.3 100.8* 102.6*  --   PLT 118* 140* 142*  --

## 2024-02-16 NOTE — Progress Notes (Addendum)
 eLink Physician-Brief Progress Note Patient Name: David [name] DOB: [birth year] MRN: [id number]   Date of Service  02/16/2024  HPI/Events of Note  Camera : high alert  Aspirated on Tube feeding. Then become unresponsive. Did not get any narcotics. Stat glucose 104, D50 stat. Hemodynamically stable. Sternal rub no response. Pupils are equal. Small.RT ambu bagging.  S/p extubated on 29 th. ESRD.  CHF exacerbation.   eICU Interventions  Low GCS. Code status ok to intubate. Ground team notified to come to bed side stat.  05:49 CCM at bed side.  RR, labored.       Intervention Category Major Interventions: Change in mental status - evaluation and management  [name]  06:48 Call received from radiology, post intubation CxR images seen. - advised to retract ET tube up by 2 cm and get CxR. Ordered. Notified bed side RN same.  02/16/2024, 5:46 AM

## 2024-02-16 NOTE — Progress Notes (Signed)
 eLink Physician-Brief Progress Note Patient Name: David [name] DOB: 10/29/1945 MRN: [id number]   Date of Service  02/16/2024  HPI/Events of Note  Concern for increased heart bruising in the patient's abdomen, concern patient's stools are red-tinged, potentially had dark vomit this morning.  Asking about holding heparin   Stable hemoglobin trend, no overt coagulopathy  eICU Interventions  Retime heparin  to continue after reassessment tomorrow. will need to alternate injection sites  Order complete coagulopathy workup for the morning   0525 -request for Flexi-Seal given copious stooling.  Attempted rectal pouch but it was ineffective.  Has some reddish stools.  Coagulopathy workup pending.  Given concern for lower GI bleed and potential coagulopathy, rectal tube would be relatively contraindicated at this time.  Has a femoral line, may need to be moved.  DC docusate, switch polyethylene glycol to as needed.  Continue other care as currently prescribed  Intervention Category Minor Interventions: Routine modifications to care plan (e.g. PRN medications for pain, fever)  David [name] 02/16/2024, 9:12 PM

## 2024-02-16 NOTE — Progress Notes (Signed)
 [facility]  Date of Admission:  02/03/2024                                  Reason for Consult: Osteomyelitis                           Referring Provider: Theodoro Lakes, MD   Antibiotics: Unasyn : 9/22 - 9/29 Vancomycin : 9/29 - 9/30 Cefepime  - 9/29 - Present Metronidazole  9/29 - Present    Assessment and Plan: The patient is a 78 year old male with past medical history significant for chronic combined systolic and diastolic heart failure (EF 30-35%), essential hypertension, chronic hypoxic respiratory failure on 3L at baseline, CKD stage 4, CAD s/p stents, type 2 diabetes mellitus and chronic diabetic foot ulcers in bilateral lower extremities who presented to the ED from a SNF on September 17th due to worsening shortness of breath x 2 weeks. The patient was admitted for a CHF exacerbation. He unfortunately went into PEA arrest while having a dialysis catheter placed in order to start HD. He was intubated during the arrest and extubated on 9/28. He remains obtunded. Hospital course complicated by osteomyelitis/gangrene to both lower extremities. Patient initially refused the right lower extremity amputation that was offered by Orthopedic Surgery   Osteomyelitis/Gangrene Right Lower Extremity  Gangrene to Left Lower Extremity Combined Systolic and Diastolic Heart Failure Transaminitis - Possible Hepatic Congestion. Hepatitis panel is negative    - The patient was on Vancomycin  and Unasyn  - Antibiotics were broadened to include Vancomycin , Cefepime  and Metronidazole  - Will discontinue Vancomycin  today since this MRSA PCR is negative - Sputum culture obtained on 9/28, will continue to follow up on final results - Further recommendations to follow pending overall clinical course and if surgical intervention If needed/wanted   Patient Active Problem List   Diagnosis Date Noted   Protein-calorie malnutrition, severe 02/10/2024   Cardiac arrest (HCC) 02/08/2024    Shock (HCC) 02/08/2024   Uremia 02/08/2024   Acute renal failure 02/08/2024   Depression 02/07/2024   Gangrene of right foot (HCC) 02/05/2024   Pressure injury of skin 02/04/2024   CKD (chronic kidney disease) stage 4, GFR 15-29 ml/min (HCC) 02/04/2024   Foot osteomyelitis, right (HCC) 02/04/2024   Type 2 diabetes mellitus with hyperlipidemia (HCC) 02/04/2024   Acute CHF (HCC) 02/03/2024   Failure to thrive in adult 01/02/2024   Hypoglycemia 01/02/2024   History of CAD (coronary artery disease) 01/02/2024   History of gastrointestinal ulcer 01/02/2024   Generalized anxiety disorder 01/02/2024   Troponin level elevated 11/18/2023   Chronic anemia 09/04/2023   Chronic combined systolic and diastolic CHF (congestive heart failure) (HCC) 09/04/2023   Acute on chronic combined systolic and diastolic CHF (congestive heart failure) (HCC) 05/12/2023   Malnutrition of moderate degree 05/06/2023   Diabetic foot infection (HCC) 05/05/2023   Sacral wound 05/05/2023   Pleural effusion on right 05/05/2023   Elevated TSH 05/05/2023   Acute on chronic diastolic (congestive) heart failure (HCC) 05/05/2023   Diastolic CHF (HCC) 05/04/2023   Class 1 obesity 06/30/2022   Acute on chronic diastolic CHF (congestive heart failure) (HCC) 06/29/2022   PAD (peripheral artery disease) 06/13/2022   Critical limb ischemia of left lower extremity (HCC) 03/21/2021   Nonhealing ulcer of left lower extremity limited to breakdown of skin (HCC) 03/21/2021   Abscess of left foot  08/03/2020   Osteomyelitis of fifth toe of left foot (HCC)    Mixed hyperlipidemia 10/15/2018   Bilateral leg edema 09/17/2018   Stage 3b chronic kidney disease (HCC) 09/10/2018   Anemia 09/10/2018   Pain and swelling of wrist, right 08/14/2018   Acute kidney injury superimposed on chronic kidney disease 08/06/2018   Bilateral carotid artery stenosis 07/30/2018   Coronary artery disease involving native coronary artery of native heart  without angina pectoris 07/30/2018   Snoring 02/11/2018   Chronic heart failure with preserved ejection fraction (HFpEF) (HCC) 12/22/2017   At risk for adverse drug reaction 12/15/2017   Chronic pain syndrome 12/15/2017   Status post coronary artery stent placement    Pneumonia due to infectious organism 03/30/2017   Insulin  dependent type 2 diabetes mellitus (HCC) 03/29/2017   Acute respiratory failure with hypoxemia (HCC) 03/29/2017   Acute on chronic respiratory failure with hypoxia (HCC)    Pulmonary congestion    Acquired contracture of Achilles tendon, right 08/19/2016   Amputated great toe, right 07/18/2016   Onychomycosis 05/29/2016   Diabetic polyneuropathy associated with type 2 diabetes mellitus (HCC) 04/09/2016   Right foot ulcer, limited to breakdown of skin (HCC) 04/09/2016   Spinal stenosis of lumbar region without neurogenic claudication 05/24/2014   Peripheral neuropathy 09/15/2013   Malaise 08/14/2013   Essential hypertension 08/14/2013   Chronic back pain 08/14/2013   Glaucoma 08/14/2013   Headache 08/14/2013    Current Discharge Medication List     CONTINUE these medications which have NOT CHANGED   Details  acetaminophen  (TYLENOL ) 500 MG tablet Take 2 tablets (1,000 mg total) by mouth in the morning and at bedtime. Qty: 120 tablet, Refills: 0    albuterol  (VENTOLIN  HFA) 108 (90 Base) MCG/ACT inhaler Inhale 2 puffs into the lungs every 6 (six) hours as needed for wheezing or shortness of breath. Qty: 1 each, Refills: 2    ascorbic acid (VITAMIN C) 500 MG tablet Take 500 mg by mouth daily.    aspirin  EC 81 MG tablet Take 1 tablet (81 mg total) by mouth daily. Qty: 30 tablet, Refills: 0    atorvastatin  (LIPITOR ) 80 MG tablet Take 1 tablet (80 mg total) by mouth at bedtime. Qty: 30 tablet, Refills: 0    bisacodyl  (DULCOLAX) 10 MG suppository Place 1 suppository (10 mg total) rectally daily as needed for moderate constipation. Qty: 12 suppository, Refills:  0    Cholecalciferol  (VITAMIN D3) 1.25 MG (50000 UT) CAPS Take 50,000 Units by mouth every 30 (thirty) days.    Cyanocobalamin  (VITAMIN B-12 CR) 1000 MCG TBCR Take 1,000 mcg by mouth every 14 (fourteen) days.    dextromethorphan -guaiFENesin  (MUCINEX  DM) 30-600 MG 12hr tablet Take 1 tablet by mouth 2 (two) times daily. Qty: 60 tablet, Refills: 0    dicyclomine  (BENTYL ) 10 MG capsule Take 1 capsule (10 mg total) by mouth in the morning, at noon, in the evening, and at bedtime. Qty: 120 capsule, Refills: 0    docusate sodium  (COLACE) 100 MG capsule Take 1 capsule (100 mg total) by mouth 2 (two) times daily. Qty: 100 capsule, Refills: 0   Comments: changed per package size    ferrous sulfate  325 (65 FE) MG tablet Take 1 tablet (325 mg total) by mouth daily with breakfast. Qty: 100 tablet, Refills: 0   Comments: changed per package size    gabapentin  (NEURONTIN ) 100 MG capsule Take 2 capsules (200 mg total) by mouth 3 (three) times daily. Qty: 180 capsule, Refills: 0  insulin  aspart (NOVOLOG ) 100 UNIT/ML injection Inject 2-12 Units into the skin in the morning and at bedtime. Per sliding scale, 201-250= 2 units, 251-300= 4 units, 301-350= 6 units, 351-400= 8 units, 401-450= 10 units, 451-500= 12 units, if greater than 400 call MD    isosorbide -hydrALAZINE  (BIDIL ) 20-37.5 MG tablet Take 2 tablets by mouth three times daily. Qty: 540 tablet, Refills: 0   Comments: Patient needs an appointment for additional refills. Last appointment : 02/2023 CGM Associated Diagnoses: Essential hypertension    latanoprost  (XALATAN ) 0.005 % ophthalmic solution Place 1 drop into both eyes at bedtime. Qty: 2.5 mL, Refills: 0    lubiprostone  (AMITIZA ) 24 MCG capsule Take 1 capsule (24 mcg total) by mouth daily. Qty: 30 capsule, Refills: 0    melatonin 3 MG TABS tablet Take 1 tablet (3 mg total) by mouth at bedtime. Qty: 30 tablet, Refills: 0    methocarbamol  (ROBAXIN ) 500 MG tablet Take 1 tablet (500 mg  total) by mouth every 6 (six) hours as needed for muscle spasms. Qty: 60 tablet, Refills: 0    metoCLOPramide  (REGLAN ) 5 MG tablet Take 1 tablet (5 mg total) by mouth 3 (three) times daily. Qty: 90 tablet, Refills: 0    Multiple Vitamins-Minerals (CERTAVITE SENIOR) TABS Take 1 tablet by mouth daily. Qty: 90 tablet, Refills: 0    nitroGLYCERIN  (NITROSTAT ) 0.4 MG SL tablet Place 1 tablet (0.4 mg total) under the tongue every 5 (five) minutes as needed for chest pain. Qty: 30 tablet, Refills: 0   Associated Diagnoses: Coronary artery disease involving native coronary artery of native heart without angina pectoris    nutrition supplement, JUVEN, (JUVEN) PACK Take 1 packet by mouth 2 (two) times daily between meals.    ondansetron  (ZOFRAN ) 4 MG tablet Take 1 tablet (4 mg total) by mouth every 6 (six) hours as needed for nausea or vomiting. Qty: 20 tablet, Refills: 0    OXYGEN Inhale 3 L/min into the lungs continuous.    pantoprazole  (PROTONIX ) 40 MG tablet Take 1 tablet (40 mg total) by mouth daily. Qty: 30 tablet, Refills: 0    polyethylene glycol (MIRALAX  / GLYCOLAX ) 17 g packet Take 17 g by mouth daily as needed for mild constipation or moderate constipation.    polyethylene glycol powder (GLYCOLAX /MIRALAX ) 17 GM/SCOOP powder Take 17 g by mouth daily. PRN order: 17 g by mouth daily as needed for constipation Qty: 510 g, Refills: 0    pyridoxine  (B-6) 100 MG tablet Take 1 tablet (100 mg total) by mouth daily. Qty: 100 tablet, Refills: 0   Comments: Changed quanity per package size    sertraline  (ZOLOFT ) 25 MG tablet Take 1 tablet (25 mg total) by mouth daily. Qty: 30 tablet, Refills: 0    sodium chloride  (OCEAN) 0.65 % SOLN nasal spray Place 1 spray into both nostrils as needed for congestion. Qty: 60 mL, Refills: 0    traMADol  (ULTRAM ) 50 MG tablet Take 1 tablet (50 mg total) by mouth every 12 (twelve) hours as needed for moderate pain (pain score 4-6) or severe pain (pain score  7-10). Qty: 6 tablet, Refills: 0    traZODone  (DESYREL ) 100 MG tablet Take 1 tablet (100 mg total) by mouth at bedtime. Qty: 30 tablet, Refills: 0    ZINC OXIDE, TOPICAL, EX Apply 1 application  topically in the morning and at bedtime. Apply to buttocks topically every day and night shift       Subjective: The patient was re-intubated this morning after he aspirated  and became unresponsive. Not on pressors. Afebrile.   Review of Systems: Unable to be obtained. Mechanically ventilated   Past Medical History:  Diagnosis Date   Anemia    Arthritis    CAD in native artery    s/p stent in 11/18   CHF (congestive heart failure) (HCC)    normal echo in 11/18   CKD (chronic kidney disease) stage 3, GFR 30-59 ml/min (HCC)    DDD (degenerative disc disease), cervical    Diabetes mellitus with complication (HCC)    Type II   Diabetic neuropathy (HCC)    Diabetic retinopathy (HCC)    GERD (gastroesophageal reflux disease)    Glaucoma    Hypertension     Social History   Tobacco Use   Smoking status: Former    Current packs/day: 0.00    Average packs/day: 0.3 packs/day for 5.0 years (1.3 ttl pk-yrs)    Types: Cigarettes    Start date: 37    Quit date: 84    Years since quitting: 29.7    Passive exposure: Past   Smokeless tobacco: Never  Vaping Use   Vaping status: Never Used  Substance Use Topics   Alcohol use: Not Currently    Comment: occ   Drug use: No   Family History  Problem Relation Age of Onset   Diabetes Other    Hyperlipidemia Other    Hypertension Other    Stroke Other    Alzheimer's disease Other    Thyroid  disease Mother    Diabetes Mellitus II Father    Alzheimer's disease Father    Allergies  Allergen Reactions   Nsaids Other (See Comments)    CKD stage 3    Objective: Vitals:   02/16/24 1015 02/16/24 1030 02/16/24 1105 02/16/24 1147  BP: (!) 89/52 (!) 100/56    Pulse: 75 80  78  Resp: (!) 28 (!) 28  (!) 24  Temp:   (!) 97.4 F (36.3  C)   TempSrc:   Axillary   SpO2: 93% 92%  97%  Weight:      Height:       Body mass index is 23.35 kg/m.  General: Elderly male lying in bed in no apparent distress. Intubated and sedated  HENT: Moist mucous membranes, normal nose, normal external ears, and normocephalic. ETT, NGT Neck: Supple, trachea midline, and normal cervical range of motion Eyes: PERRL, EOMI, non-icteric, and normal conjunctivae and lids Lungs: Tachypneic. Rales appreciated bilaterally  Cardiac: Regular rate and rhythm. No murmurs, rubs or gallops. No peripheral edema Abdomen: Soft, Non distended, Non tender, active bowel sounds Skin: Bilateral lower extremity wounds with bandages in place  GU: Deferred genital exam Musculoskeletal: No obvious skeletal abnormalities Neuro: Obtunded  Psych: Unable to be assessed    Problem List Items Addressed This Visit       Genitourinary   Acute renal failure - Primary   Other Visit Diagnoses       Shortness of breath         Other acute osteomyelitis, unspecified site (HCC)       Relevant Medications   vancomycin  (VANCOREADY) IVPB 1750 mg/350 mL (Completed)   ceFAZolin  (ANCEF ) IVPB 2g/100 mL premix (Completed)   vancomycin  (VANCOREADY) IVPB 1750 mg/350 mL (Completed)   metroNIDAZOLE  (FLAGYL ) IVPB 500 mg   ceFEPIme  (MAXIPIME ) 2 g in sodium chloride  0.9 % 100 mL IVPB (Completed)   ceFEPIme  (MAXIPIME ) 1 g in sodium chloride  0.9 % 100 mL IVPB (Start on 02/16/2024 10:00  PM)      Evalene CHRISTELLA Munch, MD [facility] 02/16/2024, 1:31 PM

## 2024-02-16 NOTE — Procedures (Addendum)
 Intubation Procedure Note  David [name]  [id number]  1945/12/31  Date:02/16/24  Time:6:07 AM   Provider Performing:Talma Aguillard Maree    Procedure: Intubation (31500)  Indication(s) Respiratory Failure, patient had vomiting and aspirated, Mental status was altered, not maintaining airway  Consent Unable to obtain consent due to emergent nature of procedure.   Anesthesia Etomidate  and Versed    Time Out Verified patient identification, verified procedure, site/side was marked, verified correct patient position, special equipment/implants available, medications/allergies/relevant history reviewed, required imaging and test results available.   Sterile Technique Usual hand hygeine, masks, and gloves were used   Procedure Description Patient positioned in bed supine.  Sedation given as noted above.  Patient was intubated with endotracheal tube using Glidescope.  View was Grade 1 full glottis .  Number of attempts was 1.  Colorimetric CO2 detector was consistent with tracheal placement.   Complications/Tolerance None; patient tolerated the procedure well. Chest X-ray is ordered to verify placement.   EBL none   Specimen(s) None

## 2024-02-16 NOTE — Plan of Care (Signed)
  Problem: Clinical Measurements: Goal: Ability to maintain clinical measurements within normal limits will improve Outcome: Progressing Goal: Will remain free from infection Outcome: Progressing Goal: Diagnostic test results will improve Outcome: Progressing Goal: Respiratory complications will improve Outcome: Progressing Goal: Cardiovascular complication will be avoided Outcome: Progressing   Problem: Activity: Goal: Risk for activity intolerance will decrease Outcome: Progressing   Problem: Pain Managment: Goal: General experience of comfort will improve and/or be controlled Outcome: Progressing   Problem: Safety: Goal: Ability to remain free from injury will improve Outcome: Progressing   Problem: Skin Integrity: Goal: Risk for impaired skin integrity will decrease Outcome: Progressing   Problem: Cardiac: Goal: Ability to achieve and maintain adequate cardiopulmonary perfusion will improve Outcome: Progressing   Problem: Coping: Goal: Ability to adjust to condition or change in health will improve Outcome: Progressing   Problem: Metabolic: Goal: Ability to maintain appropriate glucose levels will improve Outcome: Progressing   Problem: Skin Integrity: Goal: Risk for impaired skin integrity will decrease Outcome: Progressing   Problem: Tissue Perfusion: Goal: Adequacy of tissue perfusion will improve Outcome: Progressing   Problem: Activity: Goal: Ability to tolerate increased activity will improve Outcome: Progressing   Problem: Role Relationship: Goal: Method of communication will improve Outcome: Progressing   Problem: Safety: Goal: Non-violent Restraint(s) Outcome: Progressing

## 2024-02-16 NOTE — Progress Notes (Signed)
 SLP Cancellation Note  Patient Details Name: David [name] MRN: [id number] DOB: Dec 31, 1945   Cancelled treatment:       Reason Eval/Treat Not Completed: Patient not medically ready. Pt now intubated. Will sign off   Shaquira Moroz, Consuelo Fitch 02/16/2024, 9:35 AM

## 2024-02-16 NOTE — TOC Progression Note (Signed)
 Transition of Care [facility]) - Progression Note    Patient Details  Name: David [name] MRN: [id number] Date of Birth: 15-Aug-1945  Transition of [facility]) CM/SW Contact  Lauraine FORBES Saa, LCSWA Phone Number: 02/16/2024, 11:57 AM  Clinical Narrative:     11:57 AM Per progressions, patient is not medically ready for discharge. Patient is current intubated with cortrak. Patient is to start tube feeds today. CSW will continue to follow.  Expected Discharge Plan: Long Term Nursing Home Barriers to Discharge: Continued Medical Work up               Expected Discharge Plan and Services In-house Referral: Clinical Social Work   Post Acute Care Choice: Skilled Nursing Facility Living arrangements for the past 2 months: Skilled Nursing Facility                                       Social Drivers of Health (SDOH) Interventions SDOH Screenings   Food Insecurity: Patient Unable To Answer (02/04/2024)  Housing: Patient Unable To Answer (02/04/2024)  Transportation Needs: No Transportation Needs (01/02/2024)  Utilities: Not At Risk (01/02/2024)  Alcohol Screen: Low Risk  (05/18/2023)  Financial Resource Strain: Low Risk  (05/18/2023)  Physical Activity: Inactive (08/20/2017)  Social Connections: Socially Integrated (01/02/2024)  Stress: No Stress Concern Present (08/20/2017)  Tobacco Use: Medium Risk (02/08/2024)    Readmission Risk Interventions    11/09/2023   11:30 AM 11/06/2023    8:44 AM  Readmission Risk Prevention Plan  Transportation Screening Complete Complete  Medication Review (RN Care Manager) Complete Complete  PCP or Specialist appointment within 3-5 days of discharge Complete   HRI or Home Care Consult Complete Complete  SW Recovery Care/Counseling Consult Complete Complete  Palliative Care Screening Not Applicable Not Applicable  Skilled Nursing Facility Complete Not Applicable

## 2024-02-16 NOTE — Progress Notes (Signed)
 NAME:  David [name], MRN:  [id number], DOB:  1945-06-25, LOS: 13 ADMISSION DATE:  02/03/2024 CHIEF COMPLAINT:  PEA arrest.    History of Present Illness:  [name] is a 78 year old gentleman with PMH of chronic combined systolic and diastolic heart failure (EF 30-35%), essential HTN, chronic hypoxic respiratory failure (on baseline 3L O2), CKD stage IV, CAD s/p , T2DM, chronic diabetic foot ulcers in bilateral lower extremities who presented to the hospital from SNF on 9/17 for worsening dyspnea for the previous 2 weeks. In the ED a CXR demonstrated pulmonary edema, BNP was elevated to 704, Cr was elevated to 5.01. He was admitted to [facility] for heart failure exacerbation.    Nephrology was consulted for AKI on CKD versus progression of CKD and ultimately decision was made to pursue HD for volume management. He went to IR on 9/22 for dialysis catheter placement. During advancement of the guidewire in the right internal jugular he became bradycardic and subsequently became unresponsive found to be in PEA arrest. CPR was started and procedure was aborted. He was intubated during the arrest. After 3 minutes of CPR, epi, calcium , and sodium bicarbonate  pushes ROSC was achieved. He was transferred to [facility] ICU for further management.     Interim History / Subjective:  Patient overnight aspirated and became hypoxic.  Was subsequently intubated. Plateau's in 30s with tidal volume in the 620.  Decrease tidal volume to 500 with plateau around 25.  Significant Hospital Events:  has a past medical history of Anemia, Arthritis, CAD in native artery, CHF (congestive heart failure) (HCC), CKD (chronic kidney disease) stage 3, GFR 30-59 ml/min (HCC), DDD (degenerative disc disease), cervical, Diabetes mellitus with complication (HCC), Diabetic neuropathy (HCC), Diabetic retinopathy (HCC), GERD (gastroesophageal reflux disease), Glaucoma, and Hypertension.    has a past surgical history that includes Joint  replacement; Cyst excision; Total hip arthroplasty; Back surgery; Amputation (Right, 07/09/2016); RIGHT/LEFT HEART CATH AND CORONARY ANGIOGRAPHY (N/A, 04/06/2017); CORONARY STENT INTERVENTION (N/A, 04/06/2017); CORONARY STENT INTERVENTION (N/A, 04/07/2017); Cardiac catheterization; Laparoscopic abdominal exploration (N/A, 11/26/2017); Amputation (Right, 05/28/2018); Toe Surgery (05/2018); Endotracheal intubation emergent (08/07/2018); Amputation (Left, 08/03/2020); Esophagogastroduodenoscopy (Left, 11/07/2023); and IR TUNNELED CENTRAL VENOUS CATH PLC W IMG (02/08/2024).  Objective    Blood pressure (!) 94/54, pulse 77, temperature 97.7 F (36.5 C), temperature source Axillary, resp. rate (!) 27, height 6' 2 (1.88 m), weight 82.5 kg, SpO2 98%.    Vent Mode: PRVC FiO2 (%):  [40 %-100 %] 40 % Set Rate:  [20 bmp] 20 bmp Vt Set:  [500 mL-620 mL] 500 mL PEEP:  [5 cmH20] 5 cmH20 Plateau Pressure:  [22 cmH20-26 cmH20] 24 cmH20   Intake/Output Summary (Last 24 hours) at 02/16/2024 1757 Last data filed at 02/16/2024 1700 Gross per 24 hour  Intake 766.22 ml  Output 2525 ml  Net -1758.78 ml   Filed Weights   02/14/24 0256 02/15/24 0703 02/15/24 1445  Weight: 82.5 kg 82.5 kg 82.5 kg    Examination: General: Elderly male who is currently intubated. Lungs: Rales bilaterally. Heart: regular rate rhythm, no murmur appreciated.  Abdomen: non tender, non distended. Normal BS.  Neuro: Currently intubated. Bilateral lower extremity showing chronic wounds.   Assessment and Plan  78 year old gentleman with PMH of chronic combined systolic and diastolic heart failure (EF 30-35%), essential HTN, chronic hypoxic respiratory failure (on baseline 3L O2), CKD stage IV, CAD s/p , T2DM, chronic diabetic foot ulcers in bilateral lower extremities who presented to the hospital  from SNF on 9/17 for worsening dyspnea for the previous 2 weeks. Was being treated for heart failure exacerbation.  Underwent HD catheter  placement with IR and had PEA arrest for 3 minutes.  Had a repeat PEA arrest.  Hence he was presented to the ICU.  Combined systolic and diastolic heart failure: AKI on CKD4: Transaminitis: Likely related to congestive hepatopathy -Continue with fluid removal with help of dialysis.  Has a left femoral line in place. - Nephrology following.  Plan for ultrafiltration today. - Liver ultrasound-nonspecific increased hepatic echogenicity.  Trace perihepatic ascites.  Hepatitis panel pending. Trending down.   Shock: Likely combination of cardiogenic and septic. Off pressors.  Maintain MAP greater than 65.  Pulmonary hypertension likely group 2: - Echo September 2025: EF 35%, RVSP 61, moderate pericardial effusion and TR V-max 3.4 m/s. - Continue fluid removal with HD. - Will consider right heart cath in future.    Atrial fibrillation: Now in normal sinus rhythm.  Gangrenous changes in lower extremity: Osteomyelitis of 5th phalanx and metatarsal,:  - Being followed by orthopedics.  Status post right great toe and second toe amputation and left fifth ray amputation. - Patient was against amputation when seen by orthopedics. - Will need further medical optimization prior to amputation. - ID consulted.  Patient currently on Vanco cefepime  and Flagyl .  Acute hypoxemic respiratory failure: Cardiac arrest x 2: Aspiration pneumonia  -Reintubated on 9/30.  Witnessed aspiration event with aspiration content seen while intubating. -LT VV.  High risk to go into ARDS.  Encephalopathy: - Seen prior to intubation. - With nausea vomiting will get CT head.  Anemia of chronic disease: - Keep hemoglobin greater than 7.  DM 2: - Continue SSI.  Continue home Zoloft .  FEN: Tube feed. Heparin  for DVT prophylaxis.  Labs   CBC: Recent Labs  Lab 02/12/24 1841 02/12/24 1846 02/13/24 0321 02/13/24 0459 02/14/24 0431 02/15/24 0344 02/16/24 0531 02/16/24 0650  WBC 14.3*  --  18.4*  --   25.0* 17.6* 9.2  --   HGB 9.2*   < > 9.3* 9.9* 9.7* 8.1* 7.6* 8.2*  HCT 28.1*   < > 27.9* 29.0* 29.7* 25.1* 23.5* 24.0*  MCV 99.3  --  97.6  --  99.3 100.8* 102.6*  --   PLT 89*  --  98*  --  118* 140* 142*  --    < > = values in this interval not displayed.    Basic Metabolic Panel: Recent Labs  Lab 02/12/24 0354 02/12/24 1316 02/12/24 1841 02/12/24 1846 02/13/24 0321 02/13/24 0459 02/13/24 1900 02/14/24 0431 02/14/24 1600 02/15/24 0344 02/16/24 0531 02/16/24 0650  NA 135   < >  --    < > 140  138   < > 136 136  136 136 138 136 138  K 4.3   < >  --    < > 4.0  3.9   < > 3.4* 4.1  4.1 4.3 4.0 3.7 3.3*  CL 102   < >  --    < > 104  102  --  95* 102  99 100 101 97*  --   CO2 21*   < >  --    < > 21*  21*  --  26 24  24 23 25 25   --   GLUCOSE 257*   < >  --    < > 111*  109*  --  95 124*  127* 117* 126* 99  --  BUN 40*   < >  --    < > 53*  54*  --  19 40*  41* 48* 54* 46*  --   CREATININE 1.80*   < >  --    < > 2.28*  2.20*  --  1.17 2.29*  2.14* 2.94* 3.14* 2.66*  --   CALCIUM  7.4*   < >  --    < > 7.8*  7.7*  --  8.7* 7.8*  7.9* 7.9* 7.8* 8.1*  --   MG 2.5*  --  2.5*  --  2.6*  --   --  2.1  --  2.5*  --   --   PHOS 2.2*   < > 2.3*  --  2.8  --  1.5* 3.2 3.9  --  2.5  --    < > = values in this interval not displayed.   GFR: Estimated Creatinine Clearance: 27.5 mL/min (A) (by C-G formula based on SCr of 2.66 mg/dL (H)). Recent Labs  Lab 02/12/24 1853 02/13/24 0321 02/14/24 0431 02/15/24 0344 02/16/24 0531  WBC  --  18.4* 25.0* 17.6* 9.2  LATICACIDVEN 1.7  --   --   --   --     Liver Function Tests: Recent Labs  Lab 02/12/24 2022 02/13/24 0321 02/13/24 1900 02/14/24 0431 02/14/24 1600 02/15/24 0344 02/16/24 0531  AST 112* 139*  --  139*  --  671* 574*  ALT 39 47*  --  63*  --  249* 235*  ALKPHOS 260* 278*  --  290*  --  435* 478*  BILITOT 0.8 1.3*  --  1.7*  --  2.1* 2.6*  PROT 7.4 7.7  --  7.5  --  7.2 7.3  ALBUMIN  2.0* 2.0*  2.0* 2.5*  1.8*  1.9* 1.8* 1.7* 2.0*  2.1*   No results for input(s): LIPASE, AMYLASE in the last 168 hours. No results for input(s): AMMONIA in the last 168 hours.  ABG    Component Value Date/Time   PHART 7.521 (H) 02/16/2024 0650   PCO2ART 31.3 (L) 02/16/2024 0650   PO2ART 287 (H) 02/16/2024 0650   HCO3 25.6 02/16/2024 0650   TCO2 27 02/16/2024 0650   ACIDBASEDEF 8.0 (H) 02/08/2024 1446   O2SAT 100 02/16/2024 0650      Past Medical History:  He,  has a past medical history of Anemia, Arthritis, CAD in native artery, CHF (congestive heart failure) (HCC), CKD (chronic kidney disease) stage 3, GFR 30-59 ml/min (HCC), DDD (degenerative disc disease), cervical, Diabetes mellitus with complication (HCC), Diabetic neuropathy (HCC), Diabetic retinopathy (HCC), GERD (gastroesophageal reflux disease), Glaucoma, and Hypertension.   Surgical History:   Past Surgical History:  Procedure Laterality Date   AMPUTATION Right 07/09/2016   Procedure: Right Great Toe Amputation at Metatarsophalangeal Joint;  Surgeon: Jerona Harden GAILS, MD;  Location: [facility] OR;  Service: Orthopedics;  Laterality: Right;   AMPUTATION Right 05/28/2018   Procedure: RIGHT SECOND TOE AMPUTATION;  Surgeon: Harden Jerona GAILS, MD;  Location: [facility] OR;  Service: Orthopedics;  Laterality: Right;   AMPUTATION Left 08/03/2020   Procedure: LEFT 5TH RAY AMPUTATION;  Surgeon: Harden Jerona GAILS, MD;  Location: [facility] OR;  Service: Orthopedics;  Laterality: Left;   BACK SURGERY     4   CARDIAC CATHETERIZATION     CORONARY STENT INTERVENTION N/A 04/06/2017   Procedure: CORONARY STENT INTERVENTION;  Surgeon: Elmira Newman PARAS, MD;  Location: [facility] INVASIVE CV LAB;  Service: Cardiovascular;  Laterality: N/A;  CORONARY STENT INTERVENTION N/A 04/07/2017   Procedure: CORONARY STENT INTERVENTION;  Surgeon: Elmira Newman PARAS, MD;  Location: [facility] INVASIVE CV LAB;  Service: Cardiovascular;  Laterality: N/A;   CYST EXCISION     on Back   ENDOTRACHEAL INTUBATION  EMERGENT  08/07/2018       ESOPHAGOGASTRODUODENOSCOPY Left 11/07/2023   Procedure: EGD (ESOPHAGOGASTRODUODENOSCOPY);  Surgeon: Rosalie Kitchens, MD;  Location: [facility];  Service: Gastroenterology;  Laterality: Left;   IR TUNNELED CENTRAL VENOUS CATH PLC W IMG  02/08/2024   JOINT REPLACEMENT     LAPAROSCOPIC ABDOMINAL EXPLORATION N/A 11/26/2017   Procedure: LAPAROSCOPIC ABDOMINAL EXPLORATION, DRAINAGE OF APPENDICEAL ABCESS. PLACEMENT OF DRAIN;  Surgeon: Ethyl Lenis, MD;  Location: [facility] OR;  Service: General;  Laterality: N/A;   RIGHT/LEFT HEART CATH AND CORONARY ANGIOGRAPHY N/A 04/06/2017   Procedure: RIGHT/LEFT HEART CATH AND CORONARY ANGIOGRAPHY;  Surgeon: Elmira Newman PARAS, MD;  Location: [facility]VASIVE CV LAB;  Service: Cardiovascular;  Laterality: N/A;   TOE SURGERY  05/2018   Left    TOTAL HIP ARTHROPLASTY     Right      Social History:   reports that he quit smoking about 29 years ago. His smoking use included cigarettes. He started smoking about 34 years ago. He has a 1.3 pack-year smoking history. He has been exposed to tobacco smoke. He has never used smokeless tobacco. He reports that he does not currently use alcohol. He reports that he does not use drugs.   Family History:  His family history includes Alzheimer's disease in his father and another family member; Diabetes in an other family member; Diabetes Mellitus II in his father; Hyperlipidemia in an other family member; Hypertension in an other family member; Stroke in an other family member; Thyroid  disease in his mother.   Allergies Allergies  Allergen Reactions   Nsaids Other (See Comments)    CKD stage 3      Home Medications  Prior to Admission medications   Medication Sig Start Date End Date Taking? Authorizing Provider  acetaminophen  (TYLENOL ) 500 MG tablet Take 2 tablets (1,000 mg total) by mouth in the morning and at bedtime. 11/21/23  Yes Barbarann Nest, MD  albuterol  (VENTOLIN  HFA) 108 [phone] Base) MCG/ACT inhaler  Inhale 2 puffs into the lungs every 6 (six) hours as needed for wheezing or shortness of breath. 11/21/23  Yes Barbarann Nest, MD  ascorbic acid (VITAMIN C) 500 MG tablet Take 500 mg by mouth daily.   Yes [provider]  aspirin  EC 81 MG tablet Take 1 tablet (81 mg total) by mouth daily. 11/21/23  Yes Barbarann Nest, MD  atorvastatin  (LIPITOR ) 80 MG tablet Take 1 tablet (80 mg total) by mouth at bedtime. 11/21/23  Yes Barbarann Nest, MD  bisacodyl  (DULCOLAX) 10 MG suppository Place 1 suppository (10 mg total) rectally daily as needed for moderate constipation. 11/21/23  Yes Barbarann Nest, MD  Cholecalciferol  (VITAMIN D3) 1.25 MG (50000 UT) CAPS Take 50,000 Units by mouth every 30 (thirty) days.   Yes [provider]  Cyanocobalamin  (VITAMIN B-12 CR) 1000 MCG TBCR Take 1,000 mcg by mouth every 14 (fourteen) days.   Yes [provider]  dextromethorphan -guaiFENesin  (MUCINEX  DM) 30-600 MG 12hr tablet Take 1 tablet by mouth 2 (two) times daily. 11/21/23  Yes Barbarann Nest, MD  dicyclomine  (BENTYL ) 10 MG capsule Take 1 capsule (10 mg total) by mouth in the morning, at noon, in the evening, and at bedtime. 11/21/23  Yes Barbarann Nest, MD  docusate sodium  (  COLACE) 100 MG capsule Take 1 capsule (100 mg total) by mouth 2 (two) times daily. 11/21/23  Yes Barbarann Nest, MD  ferrous sulfate  325 (65 FE) MG tablet Take 1 tablet (325 mg total) by mouth daily with breakfast. 11/21/23  Yes Barbarann Nest, MD  gabapentin  (NEURONTIN ) 100 MG capsule Take 2 capsules (200 mg total) by mouth 3 (three) times daily. Patient taking differently: Take 200 mg by mouth 2 (two) times daily. 11/21/23  Yes Barbarann Nest, MD  insulin  aspart (NOVOLOG ) 100 UNIT/ML injection Inject 2-12 Units into the skin in the morning and at bedtime. Per sliding scale, 201-250= 2 units, 251-300= 4 units, 301-350= 6 units, 351-400= 8 units, 401-450= 10 units, 451-500= 12 units, if greater than 400 call MD 01/06/24  Yes Cheryle Page, MD  isosorbide -hydrALAZINE  (BIDIL ) 20-37.5 MG tablet Take 2 tablets by mouth three times daily. 11/25/23  Yes Patwardhan, Manish J, MD  latanoprost  (XALATAN ) 0.005 % ophthalmic solution Place 1 drop into both eyes at bedtime. 11/21/23  Yes Barbarann Nest, MD  lubiprostone  (AMITIZA ) 24 MCG capsule Take 1 capsule (24 mcg total) by mouth daily. 11/21/23  Yes Barbarann Nest, MD  melatonin 3 MG TABS tablet Take 1 tablet (3 mg total) by mouth at bedtime. 11/21/23  Yes Barbarann Nest, MD  methocarbamol  (ROBAXIN ) 500 MG tablet Take 1 tablet (500 mg total) by mouth every 6 (six) hours as needed for muscle spasms. 11/21/23  Yes Barbarann Nest, MD  metoCLOPramide  (REGLAN ) 5 MG tablet Take 1 tablet (5 mg total) by mouth 3 (three) times daily. 11/21/23 11/20/24 Yes Barbarann Nest, MD  Multiple Vitamins-Minerals (CERTAVITE SENIOR) TABS Take 1 tablet by mouth daily. 11/21/23  Yes Barbarann Nest, MD  nitroGLYCERIN  (NITROSTAT ) 0.4 MG SL tablet Place 1 tablet (0.4 mg total) under the tongue every 5 (five) minutes as needed for chest pain. 11/21/23  Yes Barbarann Nest, MD  nutrition supplement, JUVEN, (JUVEN) PACK Take 1 packet by mouth 2 (two) times daily between meals.   Yes [provider]  ondansetron  (ZOFRAN ) 4 MG tablet Take 1 tablet (4 mg total) by mouth every 6 (six) hours as needed for nausea or vomiting. 11/21/23  Yes Barbarann Nest, MD  OXYGEN Inhale 3 L/min into the lungs continuous.   Yes [provider]  pantoprazole  (PROTONIX ) 40 MG tablet Take 1 tablet (40 mg total) by mouth daily. 11/21/23  Yes Barbarann Nest, MD  polyethylene glycol (MIRALAX  / GLYCOLAX ) 17 g packet Take 17 g by mouth daily as needed for mild constipation or moderate constipation.   Yes [provider]  polyethylene glycol powder (GLYCOLAX /MIRALAX ) 17 GM/SCOOP powder Take 17 g by mouth daily. PRN order: 17 g by mouth daily as needed for constipation Patient taking differently: Take 17 g by mouth daily. 11/21/23  Yes  Barbarann Nest, MD  pyridoxine  (B-6) 100 MG tablet Take 1 tablet (100 mg total) by mouth daily. 11/21/23  Yes Barbarann Nest, MD  sertraline  (ZOLOFT ) 25 MG tablet Take 1 tablet (25 mg total) by mouth daily. 11/21/23  Yes Barbarann Nest, MD  sodium chloride  (OCEAN) 0.65 % SOLN nasal spray Place 1 spray into both nostrils as needed for congestion. Patient taking differently: Place 1 spray into both nostrils daily as needed for congestion. 11/21/23  Yes Barbarann Nest, MD  traMADol  (ULTRAM ) 50 MG tablet Take 1 tablet (50 mg total) by mouth every 12 (twelve) hours as needed for moderate pain (pain score 4-6) or severe pain (pain score 7-10). 01/07/24  Yes Alekh, Kshitiz,  MD  traZODone  (DESYREL ) 100 MG tablet Take 1 tablet (100 mg total) by mouth at bedtime. 11/21/23  Yes Barbarann Nest, MD  ZINC OXIDE, TOPICAL, EX Apply 1 application  topically in the morning and at bedtime. Apply to buttocks topically every day and night shift   Yes [provider]     CRITICAL CARE Performed by: Sammi JONETTA Fredericks.     Total critical care time: 45 minutes   Critical care time was exclusive of separately billable procedures and treating other patients.   Critical care was necessary to treat or prevent imminent or life-threatening deterioration.   Critical care was time spent personally by me on the following activities: development of treatment plan with patient and/or surrogate as well as nursing, discussions with consultants, evaluation of patient's response to treatment, examination of patient, obtaining history from patient or surrogate, ordering and performing treatments and interventions, ordering and review of laboratory studies, ordering and review of radiographic studies, pulse oximetry, re-evaluation of patient's condition and participation in multidisciplinary rounds.  Sammi JONETTA Fredericks, MD Pulmonary, Critical Care and Sleep Attending.  Pager: [phone]  02/16/2024, 5:57 PM

## 2024-02-17 ENCOUNTER — Inpatient Hospital Stay (HOSPITAL_COMMUNITY)

## 2024-02-17 DIAGNOSIS — G9341 Metabolic encephalopathy: Secondary | ICD-10-CM

## 2024-02-17 DIAGNOSIS — K922 Gastrointestinal hemorrhage, unspecified: Secondary | ICD-10-CM

## 2024-02-17 DIAGNOSIS — R791 Abnormal coagulation profile: Secondary | ICD-10-CM

## 2024-02-17 LAB — RENAL FUNCTION PANEL
Albumin: 1.8 g/dL — ABNORMAL LOW (ref 3.5–5.0)
Anion gap: 17 — ABNORMAL HIGH (ref 5–15)
BUN: 71 mg/dL — ABNORMAL HIGH (ref 8–23)
CO2: 23 mmol/L (ref 22–32)
Calcium: 8.3 mg/dL — ABNORMAL LOW (ref 8.9–10.3)
Chloride: 98 mmol/L (ref 98–111)
Creatinine, Ser: 3.26 mg/dL — ABNORMAL HIGH (ref 0.61–1.24)
GFR, Estimated: 19 mL/min — ABNORMAL LOW (ref >60)
Glucose, Bld: 182 mg/dL — ABNORMAL HIGH (ref 70–99)
Phosphorus: 2.1 mg/dL — ABNORMAL LOW (ref 2.5–4.6)
Potassium: 3.6 mmol/L (ref 3.5–5.1)
Sodium: 138 mmol/L (ref 135–145)

## 2024-02-17 LAB — CULTURE, RESPIRATORY W GRAM STAIN

## 2024-02-17 LAB — GLUCOSE, CAPILLARY
Glucose-Capillary: 121 mg/dL — ABNORMAL HIGH (ref 70–99)
Glucose-Capillary: 163 mg/dL — ABNORMAL HIGH (ref 70–99)
Glucose-Capillary: 164 mg/dL — ABNORMAL HIGH (ref 70–99)
Glucose-Capillary: 179 mg/dL — ABNORMAL HIGH (ref 70–99)
Glucose-Capillary: 196 mg/dL — ABNORMAL HIGH (ref 70–99)

## 2024-02-17 LAB — SEDIMENTATION RATE: Sed Rate: >140 mm/h — ABNORMAL HIGH (ref 0–16)

## 2024-02-17 LAB — DIC (DISSEMINATED INTRAVASCULAR COAGULATION)PANEL
D-Dimer, Quant: 9.82 ug{FEU}/mL — ABNORMAL HIGH (ref 0.00–0.50)
Fibrinogen: >800 mg/dL — ABNORMAL HIGH (ref 210–475)
INR: 1.6 — ABNORMAL HIGH (ref 0.8–1.2)
Platelets: 100 K/uL — ABNORMAL LOW (ref 150–400)
Prothrombin Time: 19.6 s — ABNORMAL HIGH (ref 11.4–15.2)
Smear Review: NONE SEEN
aPTT: >200 s (ref 24–36)

## 2024-02-17 LAB — CBC
HCT: 30.1 % — ABNORMAL LOW (ref 39.0–52.0)
Hemoglobin: 9.7 g/dL — ABNORMAL LOW (ref 13.0–17.0)
MCH: 32.8 pg (ref 26.0–34.0)
MCHC: 32.2 g/dL (ref 30.0–36.0)
MCV: 101.7 fL — ABNORMAL HIGH (ref 80.0–100.0)
Platelets: 100 K/uL — ABNORMAL LOW (ref 150–400)
RBC: 2.96 MIL/uL — ABNORMAL LOW (ref 4.22–5.81)
RDW: 27.6 % — ABNORMAL HIGH (ref 11.5–15.5)
WBC: 7.7 K/uL (ref 4.0–10.5)
nRBC: 1.6 % — ABNORMAL HIGH (ref 0.0–0.2)

## 2024-02-17 LAB — TRIGLYCERIDES: Triglycerides: 94 mg/dL (ref <150)

## 2024-02-17 LAB — C-REACTIVE PROTEIN: CRP: 32.5 mg/dL — ABNORMAL HIGH (ref <1.0)

## 2024-02-17 LAB — APTT: aPTT: >200 s (ref 24–36)

## 2024-02-17 MED ORDER — EPINEPHRINE HCL 5 MG/250ML IV SOLN IN NS
INTRAVENOUS | Status: AC
  Administered 2024-02-17: 20 ug/min via INTRAVENOUS
  Filled 2024-02-17: qty 250

## 2024-02-17 MED ORDER — POLYETHYLENE GLYCOL 3350 17 G PO PACK: 17.0000 g | PACK | Freq: Every day | ORAL | Status: DC | PRN

## 2024-02-17 MED ORDER — PENTAFLUOROPROP-TETRAFLUOROETH EX AERO: 1.0000 | INHALATION_SPRAY | CUTANEOUS | Status: DC | PRN

## 2024-02-17 MED ORDER — LIDOCAINE-PRILOCAINE 2.5-2.5 % EX CREA: 1.0000 | TOPICAL_CREAM | CUTANEOUS | Status: DC | PRN

## 2024-02-17 MED ORDER — NOREPINEPHRINE 4 MG/250ML-% IV SOLN: 0.0000 ug/min | INTRAVENOUS | Status: DC

## 2024-02-17 MED ORDER — HEPARIN SODIUM (PORCINE) 1000 UNIT/ML IJ SOLN
INTRAMUSCULAR | Status: AC
  Filled 2024-02-17: qty 3

## 2024-02-17 MED ORDER — HEPARIN SODIUM (PORCINE) 1000 UNIT/ML DIALYSIS: 1000.0000 [IU] | INTRAMUSCULAR | Status: DC | PRN

## 2024-02-17 MED ORDER — SODIUM CHLORIDE 0.9 % IV SOLN
1.0000 g | INTRAVENOUS | Status: DC
  Filled 2024-02-17: qty 1000

## 2024-02-17 MED ORDER — LIDOCAINE HCL (PF) 1 % IJ SOLN: 5.0000 mL | INTRAMUSCULAR | Status: DC | PRN

## 2024-02-17 MED ORDER — VITAMIN K1 10 MG/ML IJ SOLN
10.0000 mg | Freq: Once | INTRAVENOUS | Status: AC
  Administered 2024-02-17: 10 mg via INTRAVENOUS
  Filled 2024-02-17: qty 1

## 2024-02-17 MED ORDER — VASOPRESSIN 20 UNIT/ML IV SOLN
0.0000 [IU]/min | INTRAVENOUS | Status: DC
  Administered 2024-02-17: 0.03 [IU]/min via INTRAVENOUS

## 2024-02-17 MED ORDER — EPINEPHRINE HCL 5 MG/250ML IV SOLN IN NS: 0.5000 ug/min | INTRAVENOUS | Status: DC

## 2024-02-17 MED ORDER — CHLORHEXIDINE GLUCONATE CLOTH 2 % EX PADS: 6.0000 | MEDICATED_PAD | Freq: Every day | CUTANEOUS | Status: DC

## 2024-02-17 MED ORDER — SODIUM CHLORIDE 0.9 % IV SOLN: 500.0000 mg | INTRAVENOUS | Status: DC

## 2024-02-17 MED ORDER — VASOPRESSIN 20 UNITS/100 ML INFUSION FOR SHOCK
INTRAVENOUS | Status: AC
  Filled 2024-02-17: qty 100

## 2024-02-17 MED ORDER — NOREPINEPHRINE 4 MG/250ML-% IV SOLN
INTRAVENOUS | Status: AC
  Administered 2024-02-17: 12 ug/min via INTRAVENOUS
  Filled 2024-02-17: qty 250

## 2024-02-17 DEATH — deceased

## 2024-02-28 ENCOUNTER — Other Ambulatory Visit: Payer: Self-pay | Admitting: Cardiology

## 2024-02-28 DIAGNOSIS — I5032 Chronic diastolic (congestive) heart failure: Secondary | ICD-10-CM

## 2024-02-28 DIAGNOSIS — I2510 Atherosclerotic heart disease of native coronary artery without angina pectoris: Secondary | ICD-10-CM

## 2024-02-28 DIAGNOSIS — I10 Essential (primary) hypertension: Secondary | ICD-10-CM

## 2024-03-02 MED FILL — Medication: Qty: 1 | Status: AC

## 2024-03-15 MED FILL — Medication: Qty: 1 | Status: AC

## 2024-03-19 NOTE — Progress Notes (Addendum)
 NAME:  David [name], MRN:  [id number], DOB:  [birth year], LOS: 14 ADMISSION DATE:  02/03/2024 CHIEF COMPLAINT:  PEA arrest.    History of Present Illness:  Mr. Schillaci is a 78 year old gentleman with PMH of chronic combined systolic and diastolic heart failure (EF 30-35%), essential HTN, chronic hypoxic respiratory failure (on baseline 3L O2), CKD stage IV, CAD s/p , T2DM, chronic diabetic foot ulcers in bilateral lower extremities who presented to the hospital from SNF on 9/17 for worsening dyspnea for the previous 2 weeks. In the ED a CXR demonstrated pulmonary edema, BNP was elevated to 704, Cr was elevated to 5.01. He was admitted to [facility] for heart failure exacerbation.    Nephrology was consulted for AKI on CKD versus progression of CKD and ultimately decision was made to pursue HD for volume management. He went to IR on 9/22 for dialysis catheter placement. During advancement of the guidewire in the right internal jugular he became bradycardic and subsequently became unresponsive found to be in PEA arrest. CPR was started and procedure was aborted. He was intubated during the arrest. After 3 minutes of CPR, epi, calcium , and sodium bicarbonate  pushes ROSC was achieved. He was transferred to [facility] ICU for further management.     Interim History / Subjective:  There was concern for LGIB overnight and hence coagulation panel was ordered.  Hemoglobin has been stable.  Off pressors.  PMH:   has a past medical history of Anemia, Arthritis, CAD in native artery, CHF (congestive heart failure) (HCC), CKD (chronic kidney disease) stage 3, GFR 30-59 ml/min (HCC), DDD (degenerative disc disease), cervical, Diabetes mellitus with complication (HCC), Diabetic neuropathy (HCC), Diabetic retinopathy (HCC), GERD (gastroesophageal reflux disease), Glaucoma, and Hypertension.    has a past surgical history that includes Joint replacement; Cyst excision; Total hip arthroplasty; Back surgery; Amputation (Right,  07/09/2016); RIGHT/LEFT HEART CATH AND CORONARY ANGIOGRAPHY (N/A, 04/06/2017); CORONARY STENT INTERVENTION (N/A, 04/06/2017); CORONARY STENT INTERVENTION (N/A, 04/07/2017); Cardiac catheterization; Laparoscopic abdominal exploration (N/A, 11/26/2017); Amputation (Right, 05/28/2018); Toe Surgery (05/2018); Endotracheal intubation emergent (08/07/2018); Amputation (Left, 08/03/2020); Esophagogastroduodenoscopy (Left, 11/07/2023); and IR TUNNELED CENTRAL VENOUS CATH PLC W IMG (02/08/2024).  Objective    Blood pressure (!) 96/52, pulse 75, temperature (!) 97.5 F (36.4 C), temperature source Axillary, resp. rate 18, height 6' 2 (1.88 m), weight 82.5 kg, SpO2 95%.    Vent Mode: PRVC FiO2 (%):  [40 %] 40 % Set Rate:  [20 bmp] 20 bmp Vt Set:  [500 mL] 500 mL PEEP:  [5 cmH20] 5 cmH20 Pressure Support:  [5 cmH20] 5 cmH20 Plateau Pressure:  [17 cmH20-24 cmH20] 18 cmH20   Intake/Output Summary (Last 24 hours) at 02/23/2024 1432 Last data filed at 23-Feb-2024 1420 Gross per 24 hour  Intake 679.68 ml  Output 0 ml  Net 679.68 ml   Filed Weights   02/14/24 0256 02/15/24 0703 02/15/24 1445  Weight: 82.5 kg 82.5 kg 82.5 kg    Examination: General: Elderly male who is currently intubated. Lungs: Minimal rales bilaterally Heart: regular rate rhythm, no murmur appreciated.  Abdomen: non tender, non distended. Normal BS.  Neuro: Currently intubated.  Not sedated but not following commands or waking up. Bilateral lower extremity showing chronic wounds.   Assessment and Plan  78 year old gentleman with PMH of chronic combined systolic and diastolic heart failure (EF 30-35%), essential HTN, chronic hypoxic respiratory failure (on baseline 3L O2), CKD stage IV, CAD s/p , T2DM, chronic diabetic foot ulcers in bilateral lower  extremities who presented to the hospital from SNF on 9/17 for worsening dyspnea for the previous 2 weeks. Was being treated for heart failure exacerbation.  Underwent HD catheter placement  with IR and had PEA arrest for 3 minutes.  Had a repeat PEA arrest.  Hence he was presented to the ICU.  Combined systolic and diastolic heart failure: AKI on CKD4: Transaminitis: Likely related to congestive hepatopathy -Continue with fluid removal with help of dialysis.  Has a left femoral line in place. - Nephrology following.  Plan for ultrafiltration today. - Liver ultrasound-nonspecific increased hepatic echogenicity.  Trace perihepatic ascites.  Hepatitis panel pending. Trending down.   Shock: Likely combination of cardiogenic and septic. Off pressors.  Maintain MAP greater than 65.  Pulmonary hypertension likely group 2: - Echo September 2025: EF 35%, RVSP 61, moderate pericardial effusion and TR V-max 3.4 m/s. - Continue fluid removal with HD. - Will consider right heart cath in future.    Atrial fibrillation: Now in normal sinus rhythm.  Gangrenous changes in lower extremity: Osteomyelitis of 5th phalanx and metatarsal,:  - Being followed by orthopedics.  Status post right great toe and second toe amputation and left fifth ray amputation. - Patient was against amputation when seen by orthopedics. - Will need further medical optimization prior to amputation. - ID consulted.  Patient currently on Vanco cefepime  and Flagyl . - Reached out to orthopedics for potential debridement to get him more stabilized prior to more definitive surgery. - ESR CRP.  Acute hypoxemic respiratory failure: Cardiac arrest x 2: Aspiration pneumonia  -Reintubated on 9/30.  Witnessed aspiration event with aspiration content seen while intubating. -LT VV.  High risk to go into ARDS.   LGIB: - Monitor hemoglobin.  Hemoglobin stable.  Elevated APTT: - Likely related to liver dysfunction. - Dose of vitamin K given. - Does not appear to be in DIC.  Metabolic encephalopathy: - Seen prior to intubation. - CT head negative. - Likely because of above infection and prior CAC.   Anemia of chronic  disease: - Keep hemoglobin greater than 7.  DM 2: - Continue SSI.  Continue home Zoloft .  FEN: Tube feed. Heparin  discontinued.  SCDs.  Updated daughter on 03/01/2024.  See separate note.  Labs   CBC: Recent Labs  Lab 02/13/24 0321 02/13/24 0459 02/14/24 0431 02/15/24 0344 02/16/24 0531 02/16/24 0650 02/16/24 1844 2024-03-01 0612  WBC 18.4*  --  25.0* 17.6* 9.2  --   --  7.7  HGB 9.3*   < > 9.7* 8.1* 7.6* 8.2* 9.9* 9.7*  HCT 27.9*   < > 29.7* 25.1* 23.5* 24.0* 29.0* 30.1*  MCV 97.6  --  99.3 100.8* 102.6*  --   --  101.7*  PLT 98*  --  118* 140* 142*  --   --  100*  100*   < > = values in this interval not displayed.    Basic Metabolic Panel: Recent Labs  Lab 02/12/24 0354 02/12/24 1316 02/12/24 1841 02/12/24 1846 02/13/24 0321 02/13/24 0459 02/13/24 1900 02/14/24 0431 02/14/24 1600 02/15/24 0344 02/16/24 0531 02/16/24 0650 02/16/24 1844 03-01-2024 0512  NA 135   < >  --    < > 140  138   < > 136 136  136 136 138 136 138 140 138  K 4.3   < >  --    < > 4.0  3.9   < > 3.4* 4.1  4.1 4.3 4.0 3.7 3.3* 3.3* 3.6  CL 102   < >  --    < >  104  102  --  95* 102  99 100 101 97*  --   --  98  CO2 21*   < >  --    < > 21*  21*  --  26 24  24 23 25 25   --   --  23  GLUCOSE 257*   < >  --    < > 111*  109*  --  95 124*  127* 117* 126* 99  --   --  182*  BUN 40*   < >  --    < > 53*  54*  --  19 40*  41* 48* 54* 46*  --   --  71*  CREATININE 1.80*   < >  --    < > 2.28*  2.20*  --  1.17 2.29*  2.14* 2.94* 3.14* 2.66*  --   --  3.26*  CALCIUM  7.4*   < >  --    < > 7.8*  7.7*  --  8.7* 7.8*  7.9* 7.9* 7.8* 8.1*  --   --  8.3*  MG 2.5*  --  2.5*  --  2.6*  --   --  2.1  --  2.5*  --   --   --   --   PHOS 2.2*   < > 2.3*  --  2.8  --  1.5* 3.2 3.9  --  2.5  --   --  2.1*   < > = values in this interval not displayed.   GFR: Estimated Creatinine Clearance: 22.4 mL/min (A) (by C-G formula based on SCr of 3.26 mg/dL (H)). Recent Labs  Lab 02/12/24 1853  02/13/24 0321 02/14/24 0431 02/15/24 0344 02/16/24 0531 2024-03-01 0612  WBC  --    < > 25.0* 17.6* 9.2 7.7  LATICACIDVEN 1.7  --   --   --   --   --    < > = values in this interval not displayed.    Liver Function Tests: Recent Labs  Lab 02/12/24 2022 02/13/24 0321 02/13/24 1900 02/14/24 0431 02/14/24 1600 02/15/24 0344 02/16/24 0531 March 01, 2024 0512  AST 112* 139*  --  139*  --  671* 574*  --   ALT 39 47*  --  63*  --  249* 235*  --   ALKPHOS 260* 278*  --  290*  --  435* 478*  --   BILITOT 0.8 1.3*  --  1.7*  --  2.1* 2.6*  --   PROT 7.4 7.7  --  7.5  --  7.2 7.3  --   ALBUMIN  2.0* 2.0*  2.0*   < > 1.8*  1.9* 1.8* 1.7* 2.0*  2.1* 1.8*   < > = values in this interval not displayed.   No results for input(s): LIPASE, AMYLASE in the last 168 hours. No results for input(s): AMMONIA in the last 168 hours.  ABG    Component Value Date/Time   PHART 7.455 (H) 02/16/2024 1844   PCO2ART 33.6 02/16/2024 1844   PO2ART 86 02/16/2024 1844   HCO3 23.7 02/16/2024 1844   TCO2 25 02/16/2024 1844   ACIDBASEDEF 8.0 (H) 02/08/2024 1446   O2SAT 97 02/16/2024 1844      Past Medical History:  He,  has a past medical history of Anemia, Arthritis, CAD in native artery, CHF (congestive heart failure) (HCC), CKD (chronic kidney disease) stage 3, GFR 30-59 ml/min (HCC), DDD (degenerative disc disease), cervical, Diabetes mellitus with complication (  HCC), Diabetic neuropathy (HCC), Diabetic retinopathy (HCC), GERD (gastroesophageal reflux disease), Glaucoma, and Hypertension.   Surgical History:   Past Surgical History:  Procedure Laterality Date   AMPUTATION Right 07/09/2016   Procedure: Right Great Toe Amputation at Metatarsophalangeal Joint;  Surgeon: Jerona Harden GAILS, MD;  Location: [facility] OR;  Service: Orthopedics;  Laterality: Right;   AMPUTATION Right 05/28/2018   Procedure: RIGHT SECOND TOE AMPUTATION;  Surgeon: Harden Jerona GAILS, MD;  Location: [facility] OR;  Service: Orthopedics;   Laterality: Right;   AMPUTATION Left 08/03/2020   Procedure: LEFT 5TH RAY AMPUTATION;  Surgeon: Harden Jerona GAILS, MD;  Location: [facility] OR;  Service: Orthopedics;  Laterality: Left;   BACK SURGERY     4   CARDIAC CATHETERIZATION     CORONARY STENT INTERVENTION N/A 04/06/2017   Procedure: CORONARY STENT INTERVENTION;  Surgeon: Elmira Newman PARAS, MD;  Location: [facility] INVASIVE CV LAB;  Service: Cardiovascular;  Laterality: N/A;   CORONARY STENT INTERVENTION N/A 04/07/2017   Procedure: CORONARY STENT INTERVENTION;  Surgeon: Elmira Newman PARAS, MD;  Location: [facility] INVASIVE CV LAB;  Service: Cardiovascular;  Laterality: N/A;   CYST EXCISION     on Back   ENDOTRACHEAL INTUBATION EMERGENT  08/07/2018       ESOPHAGOGASTRODUODENOSCOPY Left 11/07/2023   Procedure: EGD (ESOPHAGOGASTRODUODENOSCOPY);  Surgeon: Rosalie Kitchens, MD;  Location: [facility];  Service: Gastroenterology;  Laterality: Left;   IR TUNNELED CENTRAL VENOUS CATH PLC W IMG  02/08/2024   JOINT REPLACEMENT     LAPAROSCOPIC ABDOMINAL EXPLORATION N/A 11/26/2017   Procedure: LAPAROSCOPIC ABDOMINAL EXPLORATION, DRAINAGE OF APPENDICEAL ABCESS. PLACEMENT OF DRAIN;  Surgeon: Ethyl Lenis, MD;  Location: [facility] OR;  Service: General;  Laterality: N/A;   RIGHT/LEFT HEART CATH AND CORONARY ANGIOGRAPHY N/A 04/06/2017   Procedure: RIGHT/LEFT HEART CATH AND CORONARY ANGIOGRAPHY;  Surgeon: Elmira Newman PARAS, MD;  Location: [facility] INVASIVE CV LAB;  Service: Cardiovascular;  Laterality: N/A;   TOE SURGERY  05/2018   Left    TOTAL HIP ARTHROPLASTY     Right      Social History:   reports that he quit smoking about 29 years ago. His smoking use included cigarettes. He started smoking about 34 years ago. He has a 1.3 pack-year smoking history. He has been exposed to tobacco smoke. He has never used smokeless tobacco. He reports that he does not currently use alcohol. He reports that he does not use drugs.   Family History:  His family history includes Alzheimer's  disease in his father and another family member; Diabetes in an other family member; Diabetes Mellitus II in his father; Hyperlipidemia in an other family member; Hypertension in an other family member; Stroke in an other family member; Thyroid  disease in his mother.   Allergies Allergies  Allergen Reactions   Nsaids Other (See Comments)    CKD stage 3      Home Medications  Prior to Admission medications   Medication Sig Start Date End Date Taking? Authorizing Provider  acetaminophen  (TYLENOL ) 500 MG tablet Take 2 tablets (1,000 mg total) by mouth in the morning and at bedtime. 11/21/23  Yes Barbarann Nest, MD  albuterol  (VENTOLIN  HFA) 108 [phone] Base) MCG/ACT inhaler Inhale 2 puffs into the lungs every 6 (six) hours as needed for wheezing or shortness of breath. 11/21/23  Yes Barbarann Nest, MD  ascorbic acid (VITAMIN C) 500 MG tablet Take 500 mg by mouth daily.   Yes [provider]  aspirin  EC 81 MG tablet Take 1 tablet (81  mg total) by mouth daily. 11/21/23  Yes Barbarann Nest, MD  atorvastatin  (LIPITOR ) 80 MG tablet Take 1 tablet (80 mg total) by mouth at bedtime. 11/21/23  Yes Barbarann Nest, MD  bisacodyl  (DULCOLAX) 10 MG suppository Place 1 suppository (10 mg total) rectally daily as needed for moderate constipation. 11/21/23  Yes Barbarann Nest, MD  Cholecalciferol  (VITAMIN D3) 1.25 MG (50000 UT) CAPS Take 50,000 Units by mouth every 30 (thirty) days.   Yes [provider]  Cyanocobalamin  (VITAMIN B-12 CR) 1000 MCG TBCR Take 1,000 mcg by mouth every 14 (fourteen) days.   Yes [provider]  dextromethorphan -guaiFENesin  (MUCINEX  DM) 30-600 MG 12hr tablet Take 1 tablet by mouth 2 (two) times daily. 11/21/23  Yes Barbarann Nest, MD  dicyclomine  (BENTYL ) 10 MG capsule Take 1 capsule (10 mg total) by mouth in the morning, at noon, in the evening, and at bedtime. 11/21/23  Yes Barbarann Nest, MD  docusate sodium  (COLACE) 100 MG capsule Take 1 capsule (100 mg total) by  mouth 2 (two) times daily. 11/21/23  Yes Barbarann Nest, MD  ferrous sulfate  325 (65 FE) MG tablet Take 1 tablet (325 mg total) by mouth daily with breakfast. 11/21/23  Yes Barbarann Nest, MD  gabapentin  (NEURONTIN ) 100 MG capsule Take 2 capsules (200 mg total) by mouth 3 (three) times daily. Patient taking differently: Take 200 mg by mouth 2 (two) times daily. 11/21/23  Yes Barbarann Nest, MD  insulin  aspart (NOVOLOG ) 100 UNIT/ML injection Inject 2-12 Units into the skin in the morning and at bedtime. Per sliding scale, 201-250= 2 units, 251-300= 4 units, 301-350= 6 units, 351-400= 8 units, 401-450= 10 units, 451-500= 12 units, if greater than 400 call MD 01/06/24  Yes Cheryle Page, MD  isosorbide -hydrALAZINE  (BIDIL ) 20-37.5 MG tablet Take 2 tablets by mouth three times daily. 11/25/23  Yes Patwardhan, Manish J, MD  latanoprost  (XALATAN ) 0.005 % ophthalmic solution Place 1 drop into both eyes at bedtime. 11/21/23  Yes Barbarann Nest, MD  lubiprostone  (AMITIZA ) 24 MCG capsule Take 1 capsule (24 mcg total) by mouth daily. 11/21/23  Yes Barbarann Nest, MD  melatonin 3 MG TABS tablet Take 1 tablet (3 mg total) by mouth at bedtime. 11/21/23  Yes Barbarann Nest, MD  methocarbamol  (ROBAXIN ) 500 MG tablet Take 1 tablet (500 mg total) by mouth every 6 (six) hours as needed for muscle spasms. 11/21/23  Yes Barbarann Nest, MD  metoCLOPramide  (REGLAN ) 5 MG tablet Take 1 tablet (5 mg total) by mouth 3 (three) times daily. 11/21/23 11/20/24 Yes Barbarann Nest, MD  Multiple Vitamins-Minerals (CERTAVITE SENIOR) TABS Take 1 tablet by mouth daily. 11/21/23  Yes Barbarann Nest, MD  nitroGLYCERIN  (NITROSTAT ) 0.4 MG SL tablet Place 1 tablet (0.4 mg total) under the tongue every 5 (five) minutes as needed for chest pain. 11/21/23  Yes Barbarann Nest, MD  nutrition supplement, JUVEN, (JUVEN) PACK Take 1 packet by mouth 2 (two) times daily between meals.   Yes [provider]  ondansetron  (ZOFRAN ) 4 MG tablet Take 1 tablet (4 mg  total) by mouth every 6 (six) hours as needed for nausea or vomiting. 11/21/23  Yes Barbarann Nest, MD  OXYGEN Inhale 3 L/min into the lungs continuous.   Yes [provider]  pantoprazole  (PROTONIX ) 40 MG tablet Take 1 tablet (40 mg total) by mouth daily. 11/21/23  Yes Barbarann Nest, MD  polyethylene glycol (MIRALAX  / GLYCOLAX ) 17 g packet Take 17 g by mouth daily as needed for mild constipation or moderate constipation.  Yes [provider]  polyethylene glycol powder (GLYCOLAX /MIRALAX ) 17 GM/SCOOP powder Take 17 g by mouth daily. PRN order: 17 g by mouth daily as needed for constipation Patient taking differently: Take 17 g by mouth daily. 11/21/23  Yes Barbarann Nest, MD  pyridoxine  (B-6) 100 MG tablet Take 1 tablet (100 mg total) by mouth daily. 11/21/23  Yes Barbarann Nest, MD  sertraline  (ZOLOFT ) 25 MG tablet Take 1 tablet (25 mg total) by mouth daily. 11/21/23  Yes Barbarann Nest, MD  sodium chloride  (OCEAN) 0.65 % SOLN nasal spray Place 1 spray into both nostrils as needed for congestion. Patient taking differently: Place 1 spray into both nostrils daily as needed for congestion. 11/21/23  Yes Barbarann Nest, MD  traMADol  (ULTRAM ) 50 MG tablet Take 1 tablet (50 mg total) by mouth every 12 (twelve) hours as needed for moderate pain (pain score 4-6) or severe pain (pain score 7-10). 01/07/24  Yes Cheryle Page, MD  traZODone  (DESYREL ) 100 MG tablet Take 1 tablet (100 mg total) by mouth at bedtime. 11/21/23  Yes Barbarann Nest, MD  ZINC OXIDE, TOPICAL, EX Apply 1 application  topically in the morning and at bedtime. Apply to buttocks topically every day and night shift   Yes [provider]     CRITICAL CARE Performed by: Sammi JONETTA Fredericks.     Total critical care time: 50 minutes   Critical care time was exclusive of separately billable procedures and treating other patients.   Critical care was necessary to treat or prevent imminent or life-threatening  deterioration.   Critical care was time spent personally by me on the following activities: development of treatment plan with patient and/or surrogate as well as nursing, discussions with consultants, evaluation of patient's response to treatment, examination of patient, obtaining history from patient or surrogate, ordering and performing treatments and interventions, ordering and review of laboratory studies, ordering and review of radiographic studies, pulse oximetry, re-evaluation of patient's condition and participation in multidisciplinary rounds.  Sammi JONETTA Fredericks, MD Pulmonary, Critical Care and Sleep Attending.  Pager: [phone]  24-Feb-2024, 2:32 PM

## 2024-03-19 NOTE — Progress Notes (Signed)
 Admit: 02/03/2024 LOS: 14  52M with AKI on CKD4 vs progressive CKD; Cardiac arrest x2 02/08/24   Subjective:  K 3.6, bicarbonate 23 UOP 0.  Has left femoral temporary HD catheter Remains intubated, FiO2 40%.  On levo 1. Plan HD today  09/30 0701 - 02-23-24 0700 In: 824.1 [I.V.:404.1; NG/GT:120; IV Piggyback:300] Out: 0   Filed Weights   02/14/24 0256 02/15/24 0703 02/15/24 1445  Weight: 82.5 kg 82.5 kg 82.5 kg    Scheduled Meds:  aspirin   81 mg Per Tube Daily   Chlorhexidine  Gluconate Cloth  6 each Topical Q0600   darbepoetin (ARANESP ) injection - DIALYSIS  60 mcg Subcutaneous Q Thu-1800   feeding supplement (PROSource TF20)  60 mL Per Tube BID   insulin  aspart  0-20 Units Subcutaneous Q4H   latanoprost   1 drop Both Eyes QHS   multivitamin with minerals  1 tablet Per Tube Daily   nutrition supplement (JUVEN)  1 packet Per Tube BID BM   mouth rinse  15 mL Mouth Rinse Q2H   sertraline   25 mg Per Tube Daily   sodium chloride  flush  3 mL Intravenous Q12H   Continuous Infusions:  anticoagulant sodium citrate     anticoagulant sodium citrate     ceFEPime  (MAXIPIME ) IV Stopped (02/16/24 2132)   feeding supplement (VITAL 1.5 CAL) 1,000 mL (02/16/24 1440)   metronidazole  100 mL/hr at 02-23-2024 0839   norepinephrine  (LEVOPHED ) Adult infusion 1 mcg/min (2024/02/23 0839)   propofol  (DIPRIVAN ) infusion Stopped (October 26, 2023 0817)   PRN Meds:.acetaminophen  **OR** acetaminophen , alteplase, alteplase, anticoagulant sodium citrate, anticoagulant sodium citrate, bisacodyl , fentaNYL  (SUBLIMAZE ) injection, fentaNYL  (SUBLIMAZE ) injection, heparin , lidocaine  (PF), lidocaine -prilocaine, midazolam , mouth rinse, pentafluoroprop-tetrafluoroeth, polyethylene glycol, sodium chloride  flush  Current Labs: reviewed   Physical Exam:  Blood pressure (!) 100/39, pulse 78, temperature (!) 97.5 F (36.4 C), temperature source Axillary, resp. rate 18, height 6' 2 (1.88 m), weight 82.5 kg, SpO2  98%.  Intubated/sedated RRR BLBS, mechanical Soft, nontender Lower extremity wounds bandaged Left femoral temporary HD catheter bandaged, clean and intact  A AKI on CKD 4 versus progression of CKD, starting HD during admission on 9/22, off CRRT 9/25; at this point he is likely ESRD PEA arrest 02/08/2024 x2; subsequent intermittent bradycardia episodes Urinary retention status post Foley and on tamsulosin , now DC'd Hypertension: Blood pressures have normalized after cardiac arrest -> now requiring low-dose pressors Metabolic acidosis: Improved with dialysis Anemia, transfusion per CCM, on ESA Foot wounds and osteomyelitis of the fifth proximal phalanx of fifth metatarsal head on the right side on linezolid  and Unasyn  with orthopedics following Chronic DM2 Hypophosphatemia should improve now that CRRT is stopped.  Will recheck Severe pulmonary hypertension followed by CCM and plan for right heart catheterization Acute hypoxemic respiratory failure: Intubated 9/30  P Continue optimization of volume status with HD today in the ICU  Keep femoral temporary HD catheter pending optimization of cardiac status, as his cardiac arrest occurred during attempted tunneled dialysis catheter placement Once more stable can evaluate for AV fistula/graft as well. Replace phosphorus per primary Medication Issues; Preferred narcotic agents for pain control are hydromorphone , fentanyl , and methadone. Morphine  should not be used.  Baclofen should be avoided Avoid oral sodium phosphate  and magnesium  citrate based laxatives / bowel preps   Discussed with primary RN, primary team  Dr. Evalene HERO Jaelie Aguilera  02-23-2024, 1:34 PM  Recent Labs  Lab 02/14/24 1600 02/15/24 0344 02/16/24 0531 02/16/24 0650 02/16/24 1844 02-23-24 0512  NA 136 138 136 138 140 138  K 4.3 4.0 3.7 3.3* 3.3* 3.6  CL 100 101 97*  --   --  98  CO2 23 25 25   --   --  23  GLUCOSE 117* 126* 99  --   --  182*  BUN 48* 54* 46*  --   --   71*  CREATININE 2.94* 3.14* 2.66*  --   --  3.26*  CALCIUM  7.9* 7.8* 8.1*  --   --  8.3*  PHOS 3.9  --  2.5  --   --  2.1*   Recent Labs  Lab 02/15/24 0344 02/16/24 0531 02/16/24 0650 02/16/24 1844 2024/02/22 0612  WBC 17.6* 9.2  --   --  7.7  HGB 8.1* 7.6* 8.2* 9.9* 9.7*  HCT 25.1* 23.5* 24.0* 29.0* 30.1*  MCV 100.8* 102.6*  --   --  101.7*  PLT 140* 142*  --   --  100*  100*

## 2024-03-19 NOTE — Progress Notes (Signed)
 [facility]  Date of Admission:  02/03/2024                                  Reason for Consult: Osteomyelitis                           Referring Provider: Theodoro Lakes, MD   Antibiotics: Unasyn : 9/22 - 9/29 Vancomycin : 9/29 - 9/30 Cefepime  - 9/29 - 10/1 Metronidazole  9/29 - 10/1   Assessment and Plan: The patient is a 78 year old male with past medical history significant for chronic combined systolic and diastolic heart failure (EF 30-35%), essential hypertension, chronic hypoxic respiratory failure on 3L at baseline, CKD stage 4, CAD s/p stents, type 2 diabetes mellitus and chronic diabetic foot ulcers in bilateral lower extremities who presented to the ED from a SNF on September 17th due to worsening shortness of breath x 2 weeks. The patient was admitted for a CHF exacerbation. He unfortunately went into PEA arrest while having a dialysis catheter placed in order to start HD. He was intubated during the arrest and extubated on 9/28. He remains obtunded. Hospital course complicated by osteomyelitis/gangrene to both lower extremities. Patient initially refused the right lower extremity amputation that was offered by Orthopedic Surgery   Osteomyelitis/Gangrene Right Lower Extremity  Gangrene to Left Lower Extremity Combined Systolic and Diastolic Heart Failure Transaminitis - Possible Hepatic Congestion. Hepatitis panel is negative    - The patient was on Vancomycin  and Unasyn  - Antibiotics were broadened to include Vancomycin , Cefepime  and Metronidazole  for his lower extremity wounds/gangrene/osteo - Vancomycin  was discontinued on 9/30 due to a negative MRSA PCR - Sputum culture has just resulted and he is now growing Citrobacter that is resistant to Cefepime  - Will change his antibiotics to Ertapenem - Further recommendations to follow pending overall clinical course and if surgical intervention If needed/wanted   Patient Active Problem List   Diagnosis  Date Noted   Protein-calorie malnutrition, severe 02/10/2024   Cardiac arrest (HCC) 02/08/2024   Shock (HCC) 02/08/2024   Uremia 02/08/2024   Acute renal failure 02/08/2024   Depression 02/07/2024   Gangrene of right foot (HCC) 02/05/2024   Pressure injury of skin 02/04/2024   CKD (chronic kidney disease) stage 4, GFR 15-29 ml/min (HCC) 02/04/2024   Foot osteomyelitis, right (HCC) 02/04/2024   Type 2 diabetes mellitus with hyperlipidemia (HCC) 02/04/2024   Acute CHF (HCC) 02/03/2024   Failure to thrive in adult 01/02/2024   Hypoglycemia 01/02/2024   History of CAD (coronary artery disease) 01/02/2024   History of gastrointestinal ulcer 01/02/2024   Generalized anxiety disorder 01/02/2024   Troponin level elevated 11/18/2023   Chronic anemia 09/04/2023   Chronic combined systolic and diastolic CHF (congestive heart failure) (HCC) 09/04/2023   Acute on chronic combined systolic and diastolic CHF (congestive heart failure) (HCC) 05/12/2023   Malnutrition of moderate degree 05/06/2023   Diabetic foot infection (HCC) 05/05/2023   Sacral wound 05/05/2023   Pleural effusion on right 05/05/2023   Elevated TSH 05/05/2023   Acute on chronic diastolic (congestive) heart failure (HCC) 05/05/2023   Diastolic CHF (HCC) 05/04/2023   Class 1 obesity 06/30/2022   Acute on chronic diastolic CHF (congestive heart failure) (HCC) 06/29/2022   PAD (peripheral artery disease) 06/13/2022   Critical limb ischemia of left lower extremity (HCC) 03/21/2021   Nonhealing ulcer of left  lower extremity limited to breakdown of skin (HCC) 03/21/2021   Abscess of left foot 08/03/2020   Osteomyelitis of fifth toe of left foot (HCC)    Mixed hyperlipidemia 10/15/2018   Bilateral leg edema 09/17/2018   Stage 3b chronic kidney disease (HCC) 09/10/2018   Anemia 09/10/2018   Pain and swelling of wrist, right 08/14/2018   Acute kidney injury superimposed on chronic kidney disease 08/06/2018   Bilateral carotid  artery stenosis 07/30/2018   Coronary artery disease involving native coronary artery of native heart without angina pectoris 07/30/2018   Snoring 02/11/2018   Chronic heart failure with preserved ejection fraction (HFpEF) (HCC) 12/22/2017   At risk for adverse drug reaction 12/15/2017   Chronic pain syndrome 12/15/2017   Status post coronary artery stent placement    Pneumonia due to infectious organism 03/30/2017   Insulin  dependent type 2 diabetes mellitus (HCC) 03/29/2017   Acute respiratory failure with hypoxemia (HCC) 03/29/2017   Acute on chronic respiratory failure with hypoxia (HCC)    Pulmonary congestion    Acquired contracture of Achilles tendon, right 08/19/2016   Amputated great toe, right 07/18/2016   Onychomycosis 05/29/2016   Diabetic polyneuropathy associated with type 2 diabetes mellitus (HCC) 04/09/2016   Right foot ulcer, limited to breakdown of skin (HCC) 04/09/2016   Spinal stenosis of lumbar region without neurogenic claudication 05/24/2014   Peripheral neuropathy 09/15/2013   Malaise 08/14/2013   Essential hypertension 08/14/2013   Chronic back pain 08/14/2013   Glaucoma 08/14/2013   Headache 08/14/2013    Current Discharge Medication List     CONTINUE these medications which have NOT CHANGED   Details  acetaminophen  (TYLENOL ) 500 MG tablet Take 2 tablets (1,000 mg total) by mouth in the morning and at bedtime. Qty: 120 tablet, Refills: 0    albuterol  (VENTOLIN  HFA) 108 (90 Base) MCG/ACT inhaler Inhale 2 puffs into the lungs every 6 (six) hours as needed for wheezing or shortness of breath. Qty: 1 each, Refills: 2    ascorbic acid (VITAMIN C) 500 MG tablet Take 500 mg by mouth daily.    aspirin  EC 81 MG tablet Take 1 tablet (81 mg total) by mouth daily. Qty: 30 tablet, Refills: 0    atorvastatin  (LIPITOR ) 80 MG tablet Take 1 tablet (80 mg total) by mouth at bedtime. Qty: 30 tablet, Refills: 0    bisacodyl  (DULCOLAX) 10 MG suppository Place 1  suppository (10 mg total) rectally daily as needed for moderate constipation. Qty: 12 suppository, Refills: 0    Cholecalciferol  (VITAMIN D3) 1.25 MG (50000 UT) CAPS Take 50,000 Units by mouth every 30 (thirty) days.    Cyanocobalamin  (VITAMIN B-12 CR) 1000 MCG TBCR Take 1,000 mcg by mouth every 14 (fourteen) days.    dextromethorphan -guaiFENesin  (MUCINEX  DM) 30-600 MG 12hr tablet Take 1 tablet by mouth 2 (two) times daily. Qty: 60 tablet, Refills: 0    dicyclomine  (BENTYL ) 10 MG capsule Take 1 capsule (10 mg total) by mouth in the morning, at noon, in the evening, and at bedtime. Qty: 120 capsule, Refills: 0    docusate sodium  (COLACE) 100 MG capsule Take 1 capsule (100 mg total) by mouth 2 (two) times daily. Qty: 100 capsule, Refills: 0   Comments: changed per package size    ferrous sulfate  325 (65 FE) MG tablet Take 1 tablet (325 mg total) by mouth daily with breakfast. Qty: 100 tablet, Refills: 0   Comments: changed per package size    gabapentin  (NEURONTIN ) 100 MG capsule Take 2  capsules (200 mg total) by mouth 3 (three) times daily. Qty: 180 capsule, Refills: 0    insulin  aspart (NOVOLOG ) 100 UNIT/ML injection Inject 2-12 Units into the skin in the morning and at bedtime. Per sliding scale, 201-250= 2 units, 251-300= 4 units, 301-350= 6 units, 351-400= 8 units, 401-450= 10 units, 451-500= 12 units, if greater than 400 call MD    isosorbide -hydrALAZINE  (BIDIL ) 20-37.5 MG tablet Take 2 tablets by mouth three times daily. Qty: 540 tablet, Refills: 0   Comments: Patient needs an appointment for additional refills. Last appointment : 02/2023 CGM Associated Diagnoses: Essential hypertension    latanoprost  (XALATAN ) 0.005 % ophthalmic solution Place 1 drop into both eyes at bedtime. Qty: 2.5 mL, Refills: 0    lubiprostone  (AMITIZA ) 24 MCG capsule Take 1 capsule (24 mcg total) by mouth daily. Qty: 30 capsule, Refills: 0    melatonin 3 MG TABS tablet Take 1 tablet (3 mg total) by  mouth at bedtime. Qty: 30 tablet, Refills: 0    methocarbamol  (ROBAXIN ) 500 MG tablet Take 1 tablet (500 mg total) by mouth every 6 (six) hours as needed for muscle spasms. Qty: 60 tablet, Refills: 0    metoCLOPramide  (REGLAN ) 5 MG tablet Take 1 tablet (5 mg total) by mouth 3 (three) times daily. Qty: 90 tablet, Refills: 0    Multiple Vitamins-Minerals (CERTAVITE SENIOR) TABS Take 1 tablet by mouth daily. Qty: 90 tablet, Refills: 0    nitroGLYCERIN  (NITROSTAT ) 0.4 MG SL tablet Place 1 tablet (0.4 mg total) under the tongue every 5 (five) minutes as needed for chest pain. Qty: 30 tablet, Refills: 0   Associated Diagnoses: Coronary artery disease involving native coronary artery of native heart without angina pectoris    nutrition supplement, JUVEN, (JUVEN) PACK Take 1 packet by mouth 2 (two) times daily between meals.    ondansetron  (ZOFRAN ) 4 MG tablet Take 1 tablet (4 mg total) by mouth every 6 (six) hours as needed for nausea or vomiting. Qty: 20 tablet, Refills: 0    OXYGEN Inhale 3 L/min into the lungs continuous.    pantoprazole  (PROTONIX ) 40 MG tablet Take 1 tablet (40 mg total) by mouth daily. Qty: 30 tablet, Refills: 0    polyethylene glycol (MIRALAX  / GLYCOLAX ) 17 g packet Take 17 g by mouth daily as needed for mild constipation or moderate constipation.    polyethylene glycol powder (GLYCOLAX /MIRALAX ) 17 GM/SCOOP powder Take 17 g by mouth daily. PRN order: 17 g by mouth daily as needed for constipation Qty: 510 g, Refills: 0    pyridoxine  (B-6) 100 MG tablet Take 1 tablet (100 mg total) by mouth daily. Qty: 100 tablet, Refills: 0   Comments: Changed quanity per package size    sertraline  (ZOLOFT ) 25 MG tablet Take 1 tablet (25 mg total) by mouth daily. Qty: 30 tablet, Refills: 0    sodium chloride  (OCEAN) 0.65 % SOLN nasal spray Place 1 spray into both nostrils as needed for congestion. Qty: 60 mL, Refills: 0    traMADol  (ULTRAM ) 50 MG tablet Take 1 tablet (50 mg  total) by mouth every 12 (twelve) hours as needed for moderate pain (pain score 4-6) or severe pain (pain score 7-10). Qty: 6 tablet, Refills: 0    traZODone  (DESYREL ) 100 MG tablet Take 1 tablet (100 mg total) by mouth at bedtime. Qty: 30 tablet, Refills: 0    ZINC OXIDE, TOPICAL, EX Apply 1 application  topically in the morning and at bedtime. Apply to buttocks topically every day and  night shift       Subjective: The patient remains intubated and off sedation. He does not follow commands and is not waking up. Remains on pressor support. Made DNR however, family would like to proceed with tracheostomy if the time comes. Potential for amputation of his leg if he becomes more stable. Sputum is now growing Citrobacter. Will make antibiotic adjustments. No acute events overnight  Review of Systems: Unable to be obtained. Mechanically ventilated   Past Medical History:  Diagnosis Date   Anemia    Arthritis    CAD in native artery    s/p stent in 11/18   CHF (congestive heart failure) (HCC)    normal echo in 11/18   CKD (chronic kidney disease) stage 3, GFR 30-59 ml/min (HCC)    DDD (degenerative disc disease), cervical    Diabetes mellitus with complication (HCC)    Type II   Diabetic neuropathy (HCC)    Diabetic retinopathy (HCC)    GERD (gastroesophageal reflux disease)    Glaucoma    Hypertension     Social History   Tobacco Use   Smoking status: Former    Current packs/day: 0.00    Average packs/day: 0.3 packs/day for 5.0 years (1.3 ttl pk-yrs)    Types: Cigarettes    Start date: 15    Quit date: 45    Years since quitting: 29.7    Passive exposure: Past   Smokeless tobacco: Never  Vaping Use   Vaping status: Never Used  Substance Use Topics   Alcohol use: Not Currently    Comment: occ   Drug use: No   Family History  Problem Relation Age of Onset   Diabetes Other    Hyperlipidemia Other    Hypertension Other    Stroke Other    Alzheimer's disease  Other    Thyroid  disease Mother    Diabetes Mellitus II Father    Alzheimer's disease Father    Allergies  Allergen Reactions   Nsaids Other (See Comments)    CKD stage 3    Objective: Vitals:   2024/03/10 1300 03-10-24 1315 2024/03/10 1330 03/10/2024 1345  BP: (!) 82/46 (!) 83/41 (!) 99/50 (!) 96/52  Pulse: 62 66 70 75  Resp: 19 16 15 18   Temp:      TempSrc:      SpO2: 96% 95% 96% 95%  Weight:      Height:       Body mass index is 23.35 kg/m.  General: Elderly male lying in bed in no apparent distress. Intubated  HENT: Moist mucous membranes, normal nose, normal external ears, and normocephalic. ETT, NGT Neck: Supple, trachea midline, and normal cervical range of motion Eyes: PERRL, non-icteric, and normal conjunctivae and lids Lungs: Rales appreciated bilaterally  Cardiac: Regular rate and rhythm. No murmurs, rubs or gallops. No peripheral edema Abdomen: Soft, Non distended, active bowel sounds Skin: Bilateral lower extremity wounds with bandages in place. Bloody gauze noted to his left lower extremity  GU: Deferred genital exam Musculoskeletal: No obvious skeletal abnormalities Neuro: Obtunded  Psych: Unable to be assessed    Problem List Items Addressed This Visit       Genitourinary   Acute renal failure - Primary   Other Visit Diagnoses       Shortness of breath         Other acute osteomyelitis, unspecified site (HCC)       Relevant Medications   vancomycin  (VANCOREADY) IVPB 1750 mg/350 mL (Completed)  ceFAZolin  (ANCEF ) IVPB 2g/100 mL premix (Completed)   vancomycin  (VANCOREADY) IVPB 1750 mg/350 mL (Completed)   metroNIDAZOLE  (FLAGYL ) IVPB 500 mg   ceFEPIme  (MAXIPIME ) 2 g in sodium chloride  0.9 % 100 mL IVPB (Completed)   ceFEPIme  (MAXIPIME ) 1 g in sodium chloride  0.9 % 100 mL IVPB      Evalene CHRISTELLA Munch, MD [facility] March 03, 2024, 2:35 PM

## 2024-03-19 NOTE — TOC Progression Note (Signed)
 Transition of Care [facility]) - Progression Note    Patient Details  Name: David [name] MRN: [id number] Date of Birth: 01-01-1946  [facility] CM/SW Contact  Lauraine FORBES Saa, LCSWA Phone Number: 02/22/24, 12:03 PM  Clinical Narrative:     12:03 PM Per chart review, patient is not yet medically ready for discharge to [facility]. Patient remains intubated with Cortrak. CSW will continue to follow.    Expected Discharge Plan: Long Term Nursing Home Barriers to Discharge: Continued Medical Work up               Expected Discharge Plan and Services In-house Referral: Clinical Social Work   Post Acute Care Choice: Skilled Nursing Facility Living arrangements for the past 2 months: Skilled Nursing Facility                                       Social Drivers of Health (SDOH) Interventions SDOH Screenings   Food Insecurity: Patient Unable To Answer (02/04/2024)  Housing: Patient Unable To Answer (02/04/2024)  Transportation Needs: No Transportation Needs (01/02/2024)  Utilities: Not At Risk (01/02/2024)  Alcohol Screen: Low Risk  (05/18/2023)  Financial Resource Strain: Low Risk  (05/18/2023)  Physical Activity: Inactive (08/20/2017)  Social Connections: Socially Integrated (01/02/2024)  Stress: No Stress Concern Present (08/20/2017)  Tobacco Use: Medium Risk (02/16/2024)    Readmission Risk Interventions    11/09/2023   11:30 AM 11/06/2023    8:44 AM  Readmission Risk Prevention Plan  Transportation Screening Complete Complete  Medication Review (RN Care Manager) Complete Complete  PCP or Specialist appointment within 3-5 days of discharge Complete   HRI or Home Care Consult Complete Complete  SW Recovery Care/Counseling Consult Complete Complete  Palliative Care Screening Not Applicable Not Applicable  Skilled Nursing Facility Complete Not Applicable

## 2024-03-19 NOTE — Progress Notes (Signed)
 Pharmacy: Antimicrobial Stewardship Note  20 YOM with B/L LE gangrene and RLE osteo. Also noted to have respiratory cultures growing Citrobacter species which appear to have ESBL qualities - noted resistance to both Rocephin  and Cefepime .  Procedure Component Value Units Date/Time  Culture, Respiratory w Gram Stain [[id number]]  Collected: 02/14/24 1237  Order Status: Completed Specimen: Respiratory from Tracheal Aspirate Updated: Feb 19, 2024 1415   Specimen Description TRACHEAL ASPIRATE   Special Requests NONE   Gram Stain --   RARE WBC SEEN MODERATE YEAST Performed at [facility], [address] [place], [place] [address]   Culture ABUNDANT CITROBACTER SPECIES   Report Status 2024-02-19 FINAL   Organism ID, Bacteria CITROBACTER SPECIES  Susceptibility  Citrobacter species ([id number])  Antibiotic Interpretation Microscan Method Status   CEFEPIME  Resistant >=32 RESISTANT MIC Final   ERTAPENEM Sensitive <=0.12 SENSITIVE MIC Final   CEFTRIAXONE  Resistant >=64 RESISTANT MIC Final   CIPROFLOXACIN  Sensitive <=0.06 SENSITIVE MIC Final   GENTAMICIN Sensitive <=1 SENSITIVE MIC Final   MEROPENEM Sensitive <=0.25 SENSITIVE MIC Final   TRIMETH /SULFA  Sensitive <=20 SENSITIVE MIC Final   PIP/TAZO Resistant  MIC Final         Per discussion with Dr. Evelynn, will adjust antibiotics to Ertapenem to cover for this respiratory organism and continued coverage for the leg infection.  Plan - D/c Cefepime  and Flagyl  - Start Ertapenem 1g IV x 1 dose followed by 500 mg IV every 24 hours - Will monitor improvement, plans for RRT (IHD vs CRRT)  Thank you for allowing pharmacy to be a part of this patient's care.  Almarie Lunger, PharmD, BCPS, BCIDP Infectious Diseases Clinical Pharmacist 19-Feb-2024 3:39 PM   **Pharmacist phone directory can now be found on [email or web address] ([email or web address]).  Listed under [facility].

## 2024-03-19 NOTE — Death Summary Note (Signed)
 DEATH SUMMARY   Patient Details  Name: David [name] MRN: [id number] DOB: 10/07/1945 PCP:Pcp, No  Admission/Discharge Information   Admit Date:  02/10/2024  Date of Death:  02/24/2024  Time of Death:  28-Feb-1634  Length of Stay: 14   Principle Cause of death: cardiac arrest.   Hospital Diagnoses: Principal Problem:   Acute on chronic diastolic CHF (congestive heart failure) (HCC) Active Problems:   Essential hypertension   PAD (peripheral artery disease)   History of CAD (coronary artery disease)   Pressure injury of skin   CKD (chronic kidney disease) stage 4, GFR 15-29 ml/min (HCC)   Foot osteomyelitis, right (HCC)   Type 2 diabetes mellitus with hyperlipidemia (HCC)   Gangrene of right foot (HCC)   Depression   Cardiac arrest (HCC)   Shock (HCC)   Uremia   Acute renal failure   Protein-calorie malnutrition, severe   Hospital Course: 78 old male with past medical history as described below who presented for worsening dyspnea.  He underwent HD cath placement with IR and had PEA arrest for 3 minutes and then a repeat PEA arrest and hence was brought to the ICU.  In the ICU he was intubated and was placed on CRRT for fluid removal for his combined systolic and diastolic heart failure.  He also had severe pulmonary hypertension diagnosed likely group 2.  He was subsequently diagnosed with suspected aspiration pneumonia and was on broad-spectrum antibiotics.  He was subsequently extubated but his mental status remained poor postextubation.  He was reintubated on 9/30 after a witnessed aspiration event.  He was treated with antibiotics.  His osteomyelitis of fifth phalanx and metatarsal was managed with antibiotics only.  ID was involved in his care.  He was on vancomycin  cefepime  and Flagyl . Today he was on low-dose pressor that is 1 mcg of norepinephrine .  He was off sedation and not following commands.  His CRRT was transitioned to HD.  When dialysis team came to perform HD on the  patient [even before HD was started] his pressor requirement started going up and in a span of 30 minutes he was on 3 pressors without able to maintain his blood pressure.  Quick bedside POCUS did not reveal cardiac tamponade or pneumothorax.  His LV looked underfilled but his RV was dilated.  Poor contractility of heart.  He was DNR but okay with shock.  ACLS guidelines had already been initiated and he was given 2 rounds of epi.  Initially he was having pulseless electrical activity then became asystolic and then went into polymorphic V. tach.  3 shocks were given and patient was also given magnesium .  Patient could not be resuscitated and he was pronounced dead at 4:35 PM.  Assessment and Plan: 78 year old gentleman with PMH of chronic combined systolic and diastolic heart failure (EF 30-35%), essential HTN, chronic hypoxic respiratory failure (on baseline 3L O2), CKD stage IV, CAD s/p , T2DM, chronic diabetic foot ulcers in bilateral lower extremities who presented to the hospital from [facility] on 2024/02/10 for worsening dyspnea for the previous 2 weeks. Was being treated for heart failure exacerbation.  Underwent HD catheter placement with IR and had PEA arrest for 3 minutes.  Had a repeat PEA arrest.  Hence he was presented to the ICU.   Combined systolic and diastolic heart failure: AKI on CKD4: Transaminitis: Likely related to congestive hepatopathy -Continue with fluid removal with help of dialysis.  Has a left femoral line in place. - Nephrology  following.  Plan for ultrafiltration today. - Liver ultrasound-nonspecific increased hepatic echogenicity.  Trace perihepatic ascites.  Hepatitis panel pending. Trending down.    Shock: Likely combination of cardiogenic and septic. Off pressors.  Maintain MAP greater than 65.   Pulmonary hypertension likely group 2: - Echo September 2025: EF 35%, RVSP 61, moderate pericardial effusion and TR V-max 3.4 m/s. - Continue fluid removal with HD. - Will consider  right heart cath in future.    Atrial fibrillation: Now in normal sinus rhythm.   Gangrenous changes in lower extremity: Osteomyelitis of 5th phalanx and metatarsal,:  - Being followed by orthopedics.  Status post right great toe and second toe amputation and left fifth ray amputation. - Patient was against amputation when seen by orthopedics. - Will need further medical optimization prior to amputation. - ID consulted.  Patient currently on Vanco cefepime  and Flagyl . - Reached out to orthopedics for potential debridement to get him more stabilized prior to more definitive surgery. - ESR CRP.   Acute hypoxemic respiratory failure: Cardiac arrest x 2: Aspiration pneumonia  -Reintubated on 9/30.  Witnessed aspiration event with aspiration content seen while intubating. -LT VV.  High risk to go into ARDS.    LGIB: - Monitor hemoglobin.  Hemoglobin stable.   Elevated APTT: - Likely related to liver dysfunction. - Dose of vitamin K given. - Does not appear to be in DIC.   Metabolic encephalopathy: - Seen prior to intubation. - CT head negative. - Likely because of above infection and prior CAC.    Anemia of chronic disease: - Keep hemoglobin greater than 7.   DM 2: - Continue SSI.        Procedures: temporary HD cath.   Consultations: Orthopedics, IR, Renal.   The results of significant diagnostics from this hospitalization (including imaging, microbiology, ancillary and laboratory) are listed below for reference.   Significant Diagnostic Studies: CT HEAD WO CONTRAST ( ) Result Date: 03-03-24 CLINICAL DATA:  Initial evaluation for altered mental status. EXAM: CT HEAD WITHOUT CONTRAST TECHNIQUE: Contiguous axial images were obtained from the base of the skull through the vertex without intravenous contrast. RADIATION DOSE REDUCTION: This exam was performed according to the departmental dose-optimization program which includes automated exposure control, adjustment  of the mA and/or kV according to patient size and/or use of iterative reconstruction technique. COMPARISON:  Prior study from 02/13/2015. FINDINGS: Brain: Generalized age-related cerebral atrophy. Patchy hypodensity involving the supratentorial white matter, consistent with chronic small vessel ischemic disease, moderate in nature. Encephalomalacia at the anterior left frontal lobe, consistent with a chronic left ACA distribution infarct. No acute intracranial hemorrhage. No acute large vessel territory infarct. No mass lesion, midline shift or mass effect. No hydrocephalus or extra-axial fluid collection. Vascular: No abnormal hyperdense vessel. Calcified atherosclerosis present at the skull base. Skull: Scalp soft tissues demonstrate no acute finding. Calvarium intact. Sinuses/Orbits: Globes and orbital soft tissues demonstrate no acute finding. Ocular senescent calcifications noted. Right maxillary sinus retention cyst noted. Visualized paranasal sinuses are otherwise largely clear. Large left mastoid and middle ear effusion. Image nasopharynx unremarkable. Other: None. IMPRESSION: 1. No acute intracranial abnormality. 2. Age-related cerebral atrophy with moderate chronic small vessel ischemic disease. Chronic left ACA distribution infarct. 3. Large left mastoid and middle ear effusion, of uncertain significance. Correlation with physical exam recommended. Electronically Signed   By: Morene Hoard M.D.   On: 03/03/2024 02:40   DG CHEST PORT 1 VIEW Result Date: 02/16/2024 CLINICAL DATA:  [id number] Endotracheal tube present [id number]  EXAM: PORTABLE CHEST - 1 VIEW COMPARISON:  02/16/2024 6:27 a.m. FINDINGS: Interval retraction of the endotracheal tube, which now terminates in the mid trachea. Weighted feeding tube courses below the diaphragm with the distal tip not included in the field of view. Lower lung volumes. Similar small layering bilateral pleural effusions, larger on the right than the left. Mild  cardiomegaly. Tortuous aorta with aortic atherosclerosis. Multilevel thoracic osteophytosis. IMPRESSION: 1. Well-positioned endotracheal tube, which has been retracted in the interim. Weighted feeding tube courses below the diaphragm with the distal tip not included in the field of view. No pneumothorax. 2. No significant change to the lungs. Electronically Signed   By: Rogelia Myers M.D.   On: 02/16/2024 08:56   Portable Chest x-ray Addendum Date: 02/16/2024 ADDENDUM REPORT: 02/16/2024 06:59 ADDENDUM: Study discussed by telephone with Dr. Rober on 02/16/2024 at 0647 hours. Electronically Signed   By: VEAR Hurst M.D.   On: 02/16/2024 06:59   Result Date: 02/16/2024 CLINICAL DATA:  78 year old male with respiratory failure, intubated. EXAM: PORTABLE CHEST 1 VIEW COMPARISON:  Portable chest yesterday and earlier. FINDINGS: Portable AP semi upright view at 0627 hours. Right mainstem bronchus intubation. ETT tip should be retracted about 2 cm. Stable enteric feeding tube coursing to the abdomen, tip not included. Veiling lung base pleural effusions, more apparent on the right, stable. Mildly improved lung volumes and bilateral ventilation. No pneumothorax. No new pulmonary opacity. Stable cardiac size and mediastinal contours. Stable visualized osseous structures. Paucity of bowel gas. IMPRESSION: 1. Right mainstem bronchus intubation. Recommend ETT retraction of 2 cm, repeat portable chest to confirm. 2. Mildly improved lung volumes and ventilation with evidence of bilateral pleural effusions, patchy nonspecific lung opacity. 3. Stable visible enteric feeding tube. Electronically Signed: By: VEAR Hurst M.D. On: 02/16/2024 06:36   US  Abdomen Limited RUQ (LIVER/GB) Result Date: 02/15/2024 CLINICAL DATA:  Hepatitis. EXAM: ULTRASOUND ABDOMEN LIMITED RIGHT UPPER QUADRANT COMPARISON:  None Available. FINDINGS: Gallbladder: No gallstones or wall thickening visualized. Sludge is noted in the gallbladder. No sonographic  Murphy sign noted by sonographer. Common bile duct: Diameter: 7 mm Liver: No focal lesion identified. Diffusely increased hepatic parenchymal echogenicity. Trace perihepatic ascites. Portal vein is patent on color Doppler imaging with normal direction of blood flow towards the liver. Other: None. IMPRESSION: 1. Diffusely increased hepatic parenchymal echogenicity, which is nonspecific, but can be seen in the setting of hepatic steatosis or other hepatocellular disease processes. Correlate with liver function tests. 2. Trace perihepatic ascites. Electronically Signed   By: Harrietta Sherry M.D.   On: 02/15/2024 10:00   DG Chest Port 1 View Result Date: 02/15/2024 EXAM: 1 VIEW(S) XRAY OF THE CHEST 02/15/2024 01:23:15 AM COMPARISON: Comparison with x-rays of 02/14/2024. CLINICAL HISTORY: [id number] Increased tracheal secretions [id number]. Increased tracheal secretion Increased tracheal secretions [id number]. Increased tracheal secretion. FINDINGS: LINES, TUBES AND DEVICES: Some diaphragmatic enteric tube. The endotracheal tube has been removed. LUNGS AND PLEURA: Low lung volumes accentuate pulmonary vascularity. Small bilateral pleural effusions. No pneumothorax. No focal pulmonary opacity. No pulmonary edema. HEART AND MEDIASTINUM: Stable cardiomediastinal silhouette. Coronary stenting is present. BONES AND SOFT TISSUES: No acute osseous abnormality. IMPRESSION: 1. Low lung volumes accentuating pulmonary vascularity. 2. Small bilateral pleural effusions. Electronically signed by: Norman Gatlin MD 02/15/2024 01:28 AM EDT RP Workstation: [id number]   DG CHEST PORT 1 VIEW Result Date: 02/14/2024 EXAM: 1 VIEW(S) XRAY OF THE CHEST 02/14/2024 05:52:00 AM COMPARISON: 02/13/2024 CLINICAL HISTORY: Endotracheal tube present; Best images obtainable due to patient's condition FINDINGS:  LINES, TUBES AND DEVICES: Endotracheal tube in place with tip 5.9 cm above the carina. Enteric tube in place extending below the diaphragm, distal tip  beyond the inferior margin of the film. LUNGS AND PLEURA: Similar layering right pleural effusion. Increased hazy opacities throughout the right lung. Increased left basilar atelectasis. No pneumothorax. HEART AND MEDIASTINUM: Aortic atherosclerosis. No acute abnormality of the cardiac and mediastinal silhouettes. BONES AND SOFT TISSUES: No acute osseous abnormality. IMPRESSION: 1. Endotracheal and enteric tubes appropriately positioned. 2. Increased hazy opacities throughout the right lung, which may reflect worsening infection or edema in the appropriate clinical context. 3. Similar layering right pleural effusion. 4. Increased left basilar atelectasis. 5. Aortic atherosclerosis. Electronically signed by: Waddell Calk MD 02/14/2024 07:16 AM EDT RP Workstation: [id number]   DG CHEST PORT 1 VIEW Result Date: 02/13/2024 CLINICAL DATA:  Acute on chronic respiratory failure. EXAM: PORTABLE CHEST 1 VIEW COMPARISON:  02/12/2024 FINDINGS: Feeding tube extending into the stomach with its tip not included. Endotracheal tube in satisfactory position. Mildly enlarged cardiac silhouette without significant change. Stable small to moderate-sized right pleural effusion and mild right basilar atelectasis. Clear left lung. Coronary artery stent. Thoracic spine degenerative changes. IMPRESSION: 1. Stable small to moderate-sized right pleural effusion and mild right basilar atelectasis. 2. Stable mild cardiomegaly. Electronically Signed   By: Elspeth Bathe M.D.   On: 02/13/2024 10:39   DG Abd 1 View Result Date: 02/12/2024 EXAM: 1 VIEW XRAY OF THE ABDOMEN 02/12/2024 06:58:00 PM COMPARISON: 02/08/2024 CLINICAL HISTORY: Vomiting [id number]. vomiting FINDINGS: LINES, TUBES AND DEVICES: Enteric tube in place with distal tip terminating within the expected location of the gastric antrum. BOWEL: Gaseous distension of the stomach. Nonobstructive bowel gas pattern. SOFT TISSUES: No opaque urinary calculi. BONES: No acute osseous  abnormality. IMPRESSION: 1. Enteric tube appropriately positioned with tip in the gastric antrum. Electronically signed by: Norman Gatlin MD 02/12/2024 07:15 PM EDT RP Workstation: [id number]   VAS US  ABI WITH/WO TBI Result Date: 02/09/2024  LOWER EXTREMITY DOPPLER STUDY Patient Name:  David [name]  Date of Exam:   02/09/2024 Medical Rec #: [id number]         Accession #:    [id number] Date of Birth: 1945/09/04        Patient Gender: M Patient Age:   65 years Exam [facility]:  [facility] Procedure:      VAS US  ABI WITH/WO TBI Referring Phys: DANIEL MARCHWIANY --------------------------------------------------------------------------------  Indications: Gangrene, and peripheral artery disease. High Risk Factors: Hypertension, hyperlipidemia, Diabetes, past history of                    smoking, coronary artery disease. Other Factors: CHF, CKD, RLE great toe amputation (2018), RLE 2nd toe amputation                (2022), LLE 5th ray amputation (2022).  Limitations: Today's exam was limited due to an open wound and bandages. Comparison Study: Previous exam was on 11/02/2023 Performing Technologist: Leigh Rom RVT/RDMS  Examination Guidelines: A complete evaluation includes at minimum, Doppler waveform signals and systolic blood pressure reading at the level of bilateral brachial, anterior tibial, and posterior tibial arteries, when vessel segments are accessible. Bilateral testing is considered an integral part of a complete examination. Photoelectric Plethysmograph (PPG) waveforms and toe systolic pressure readings are included as required and additional duplex testing as needed. Limited examinations for reoccurring indications may be performed as noted.  ABI Findings: +---------+------------------+-----+---------+----------+ Right    Rt Pressure (mmHg)IndexWaveform  Comment    +---------+------------------+-----+---------+----------+ Brachial 121                    triphasic            +---------+------------------+-----+---------+----------+ PTA      126               1.04 biphasic            +---------+------------------+-----+---------+----------+ DP       112               0.93 biphasic            +---------+------------------+-----+---------+----------+ Great Toe                                amputation +---------+------------------+-----+---------+----------+ +---------+------------------+-----+----------+----------------------+ Left     Lt Pressure (mmHg)IndexWaveform  Comment                +---------+------------------+-----+----------+----------------------+ Brachial                                  art line, central line +---------+------------------+-----+----------+----------------------+ PTA      91                0.75 biphasic                         +---------+------------------+-----+----------+----------------------+ DP       78                0.64 monophasic                       +---------+------------------+-----+----------+----------------------+ Great Toe52                0.43 Abnormal                         +---------+------------------+-----+----------+----------------------+ +-------+-----------+-----------+------------+--------------+ ABI/TBIToday's ABIToday's TBIPrevious ABIPrevious TBI   +-------+-----------+-----------+------------+--------------+ Right  1.04       amputation 1.24        amputation     +-------+-----------+-----------+------------+--------------+ Left   0.75       0.43       0.72        n/a (bandages) +-------+-----------+-----------+------------+--------------+ Difficulty obtaining bilateral DPA doppler waveforms due to bandages.  Summary: Right: Resting right ankle-brachial index is within normal range.  Left: Resting left ankle-brachial index indicates moderate left lower extremity arterial disease. The left toe-brachial index is abnormal.  *See table(s) above for measurements and  observations.  Electronically signed by Gaile New MD on 02/09/2024 at 8:44:29 PM.    Final    ECHOCARDIOGRAM COMPLETE Result Date: 02/08/2024    ECHOCARDIOGRAM REPORT   Patient Name:   David [name] Date of Exam: 02/08/2024 Medical Rec #:  [id number]        Height:       74.0 in Accession #:    [id number]       Weight:       206.8 lb Date of Birth:  1946-01-05       BSA:          2.205 m Patient Age:    77 years         BP:           96/66 mmHg Patient Gender: M  HR:           78 bpm. Exam Location:  Inpatient Procedure: 2D Echo, Cardiac Doppler and Color Doppler (Both Spectral and Color            Flow Doppler were utilized during procedure). Indications:     CHF-Acute Systolic I50.21  History:         Patient has prior history of Echocardiogram examinations, most                  recent 10/30/2023. CHF, CAD, PAD and CKD, stage 4; Risk                  Factors:Hypertension, Diabetes and Dyslipidemia.  Sonographer:     Thea Norlander RCS Referring Phys:  REXENE LOISE BLUSH Diagnosing Phys: Ezra Kanner IMPRESSIONS  1. Left ventricular ejection fraction, by estimation, is 35%. The left ventricle has moderately decreased function. The left ventricle demonstrates global hypokinesis. There is mild concentric left ventricular hypertrophy. Left ventricular diastolic parameters are indeterminate.  2. Mildly D-shaped interventricular septum suggesting a degree of RV pressure/volume overload. Right ventricular systolic function is severely reduced. The right ventricular size is moderately enlarged. There is severely elevated pulmonary artery systolic pressure. The estimated right ventricular systolic pressure is 60.7 mmHg.  3. Right atrial size was moderately dilated.  4. The mitral valve is normal in structure. Trivial mitral valve regurgitation. No evidence of mitral stenosis.  5. The aortic valve is tricuspid. There is mild calcification of the aortic valve. Aortic valve regurgitation is not  visualized. No aortic stenosis is present.  6. The inferior vena cava is dilated in size with <50% respiratory variability, suggesting right atrial pressure of 15 mmHg.  7. Moderate pericardial effusion. There is no evidence of cardiac tamponade. FINDINGS  Left Ventricle: Left ventricular ejection fraction, by estimation, is 35%. The left ventricle has moderately decreased function. The left ventricle demonstrates global hypokinesis. The left ventricular internal cavity size was normal in size. There is mild concentric left ventricular hypertrophy. Left ventricular diastolic parameters are indeterminate. Right Ventricle: Mildly D-shaped interventricular septum suggesting a degree of RV pressure/volume overload. The right ventricular size is moderately enlarged. No increase in right ventricular wall thickness. Right ventricular systolic function is severely reduced. There is severely elevated pulmonary artery systolic pressure. The tricuspid regurgitant velocity is 3.38 m/s, and with an assumed right atrial pressure of 15 mmHg, the estimated right ventricular systolic pressure is 60.7 mmHg. Left Atrium: Left atrial size was normal in size. Right Atrium: Right atrial size was moderately dilated. Pericardium: A moderately sized pericardial effusion is present. There is no evidence of cardiac tamponade. Mitral Valve: The mitral valve is normal in structure. Trivial mitral valve regurgitation. No evidence of mitral valve stenosis. Tricuspid Valve: The tricuspid valve is normal in structure. Tricuspid valve regurgitation is mild. Aortic Valve: The aortic valve is tricuspid. There is mild calcification of the aortic valve. Aortic valve regurgitation is not visualized. No aortic stenosis is present. Aortic valve peak gradient measures 5.8 mmHg. Pulmonic Valve: The pulmonic valve was normal in structure. Pulmonic valve regurgitation is trivial. Aorta: The aortic root is normal in size and structure. Venous: The inferior vena  cava is dilated in size with less than 50% respiratory variability, suggesting right atrial pressure of 15 mmHg. IAS/Shunts: No atrial level shunt detected by color flow Doppler.  LEFT VENTRICLE PLAX 2D LVIDd:         3.60 cm   Diastology LVIDs:  2.90 cm   LV e' medial:    4.57 cm/s LV PW:         1.20 cm   LV E/e' medial:  14.0 LV IVS:        1.00 cm   LV e' lateral:   13.80 cm/s LVOT diam:     1.80 cm   LV E/e' lateral: 4.6 LV SV:         37 LV SV Index:   17 LVOT Area:     2.54 cm  RIGHT VENTRICLE            IVC RV S prime:     8.92 cm/s  IVC diam: 2.70 cm TAPSE (M-mode): 1.0 cm LEFT ATRIUM           Index        RIGHT ATRIUM           Index LA diam:      2.80 cm 1.27 cm/m   RA Area:     36.60 cm LA Vol (A2C): 30.9 ml 14.02 ml/m  RA Volume:   181.00 ml 82.10 ml/m LA Vol (A4C): 24.4 ml 11.07 ml/m  AORTIC VALVE AV Area (Vmax): 1.75 cm AV Vmax:        120.00 cm/s AV Peak Grad:   5.8 mmHg LVOT Vmax:      82.30 cm/s LVOT Vmean:     50.800 cm/s LVOT VTI:       0.146 m  AORTA Ao Root diam: 3.20 cm Ao Asc diam:  3.60 cm MITRAL VALVE               TRICUSPID VALVE MV Area (PHT): 3.42 cm    TR Peak grad:   45.7 mmHg MV Decel Time: 222 msec    TR Vmax:        338.00 cm/s MV E velocity: 63.90 cm/s MV A velocity: 43.50 cm/s  SHUNTS MV E/A ratio:  1.47        Systemic VTI:  0.15 m                            Systemic Diam: 1.80 cm Dalton McleanMD Electronically signed by Ezra Kanner Signature Date/Time: 02/08/2024/6:14:35 PM    Final (Updated)    IR TUNNELED CENTRAL VENOUS CATH [facility] IMG Result Date: 02/08/2024 CLINICAL DATA:  Renal failure and need for tunneled hemodialysis catheter. EXAM: ABORTED TUNNELED CENTRAL VENOUS HEMODIALYSIS CATHETER PLACEMENT ANESTHESIA/SEDATION: Moderate (conscious) sedation was employed during this procedure. A total of Versed  1.0 mg and Fentanyl  25 mcg was administered intravenously. MEDICATIONS: 2 g IV Ancef . FLUOROSCOPY: None PROCEDURE: The procedure, risks, benefits, and  alternatives were explained to the patient's daughter. Questions regarding the procedure were encouraged and answered. The patient's daughter understands and consents to the procedure. A timeout was performed prior to initiating the procedure. The right neck and chest were prepped with chlorhexidine  in a sterile fashion, and a sterile drape was applied covering the operative field. Maximum barrier sterile technique with sterile gowns and gloves were used for the procedure. Local anesthesia was provided with 1% lidocaine . Ultrasound was performed to confirm patency of the right internal jugular vein. An ultrasound image was saved and recorded. After creating a small venotomy incision, a 21 gauge needle was advanced into the right internal jugular vein under direct, real-time ultrasound guidance. A 0.018 inch guidewire was then advanced through the 21 gauge needle. At that point, the patient was  noted to be unresponsive and bradycardic followed by loss of pulse. A Code Blue was called and the procedure aborted. The needle and guidewire were immediately removed and pressure held over the puncture site. The patient was immediately given Narcan  and flumazenil  to reverse the administered Versed  and fentanyl . CPR was started. COMPLICATIONS: None.  No pneumothorax. FINDINGS: See above. IMPRESSION: Aborted placement of tunneled hemodialysis catheter as above. A Code Blue was called and the patient resuscitated by administration of reversal medications, CPR and further resuscitation by the code team. Electronically Signed   By: Marcey Moan M.D.   On: 02/08/2024 18:05   DG Abd 1 View Result Date: 02/08/2024 EXAM: 1 VIEW XRAY OF THE ABDOMEN 02/08/2024 03:01:00 PM COMPARISON: 05/11/2023 CLINICAL HISTORY: Acute hypoxic respiratory failure (HCC), Nasogastric tube present FINDINGS: LINES, TUBES AND DEVICES: Enteric tube in place with tip positioned over the gastric body. Defibrillator pads noted over the lower chest. BOWEL:  No bowel dilatation in the included upper abdomen. SOFT TISSUES: No opaque urinary calculi. BONES: No acute osseous abnormality. IMPRESSION: 1. Enteric tube with tip in the gastric body, appropriately positioned. Electronically signed by: Andrea Gasman MD 02/08/2024 06:03 PM EDT RP Workstation: [id number]   DG CHEST PORT 1 VIEW Result Date: 02/08/2024 CLINICAL DATA:  [id number] Acute hypoxic respiratory failure (HCC) [id number] EXAM: PORTABLE CHEST - 1 VIEW COMPARISON:  02/03/2024 FINDINGS: Interval placement of an endotracheal tube, which terminates in the mid trachea. Defibrillator pad overlying the chest. Lower lung volumes. Bilateral perihilar interstitial opacities. Patchy opacities in the lung bases. Small right pleural effusion and trace left pleural effusion. No pneumothorax. No cardiomegaly. Esophagogastric tube courses below the diaphragm with the distal tip not included in the field of view. IMPRESSION: 1. Low lung volumes with findings suggesting pulmonary edema. Small right and trace left pleural effusions. 2. Well-positioned endotracheal tube terminating in the mid trachea. No pneumothorax. 3. Esophagogastric tube courses below the diaphragm with the distal tip not included in the field of view. Electronically Signed   By: Rogelia Myers M.D.   On: 02/08/2024 15:19   MR FOOT RIGHT WO CONTRAST Result Date: 02/03/2024 CLINICAL DATA:  Right foot ulcer.  Concern for osteomyelitis. EXAM: MRI OF THE RIGHT FOREFOOT WITHOUT CONTRAST TECHNIQUE: Multiplanar, multisequence MR imaging of the right forefoot was performed. No intravenous contrast was administered. COMPARISON:  Right foot radiographs dated 05/06/2023. MRI of the right foot dated 02/06/2015. FINDINGS: Bones/Joint/Cartilage Soft tissue ulceration at the lateral plantar forefoot, extends to the level of the underlying fifth MTP joint. Marrow edema with corresponding T1 hypointensity of the fifth proximal phalanx and the fifth metatarsal head and neck  with associated areas of cortical indistinctness is compatible with osteomyelitis. No marrow signal abnormality identified elsewhere to suggest osteomyelitis. 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 No significant joint effusion. No fracture or dislocation. Mild degenerative arthropathy of the midfoot. Ligaments Lisfranc ligament is intact. Muscles and Tendons Fatty atrophy of the intrinsic musculature of the foot, likely reflects chronic denervation changes. No significant tenosynovitis. Soft tissue Soft tissue ulceration at the lateral plantar forefoot at the level of the fifth MTP joint with surrounding soft tissue thickening and edema. No loculated fluid collection. Postoperative changes related to prior amputations of the first and second toes. IMPRESSION: 1. Osteomyelitis of the fifth proximal phalanx and the fifth metatarsal head and neck with overlying soft tissue ulceration at the lateral plantar forefoot. No loculated fluid collection. 2. Postoperative changes related to amputation of the first and  second toes. 3. Chronic denervation changes of the intrinsic foot musculature. Electronically Signed   By: Harrietta Sherry M.D.   On: 02/03/2024 17:08   DG Chest 2 View Result Date: 02/03/2024 CLINICAL DATA:  Shortness of breath EXAM: CHEST - 2 VIEW COMPARISON:  11/01/2023 FINDINGS: Similar low lung volumes with cardiomegaly, vascular congestion, and basilar opacities favored to be atelectasis. Suspect small bilateral pleural effusions. No pneumothorax. Trachea midline. Degenerative changes of the spine. Very limited lateral view because of technique. IMPRESSION: 1. Cardiomegaly with vascular congestion and basilar atelectasis. 2. Suspect small bilateral pleural effusions. Electronically Signed   By: CHRISTELLA.  Shick M.D.   On: 02/03/2024 10:56    Microbiology: Recent Results (from the past 240 hours)  Culture, blood (Routine X 2) w Reflex to ID Panel     Status: None   Collection  Time: 02/08/24  7:19 PM   Specimen: BLOOD RIGHT ARM  Result Value Ref Range Status   Specimen Description BLOOD RIGHT ARM  Final   Special Requests   Final    BOTTLES DRAWN AEROBIC AND ANAEROBIC Blood Culture adequate volume   Culture   Final    NO GROWTH 5 DAYS Performed at [facility], [address] [place], [place] [address]    Report Status 02/13/2024 FINAL  Final  Culture, blood (Routine X 2) w Reflex to ID Panel     Status: None   Collection Time: 02/08/24  7:20 PM   Specimen: BLOOD  Result Value Ref Range Status   Specimen Description BLOOD SITE NOT SPECIFIED  Final   Special Requests   Final    BOTTLES DRAWN AEROBIC AND ANAEROBIC Blood Culture adequate volume   Culture   Final    NO GROWTH 5 DAYS Performed at [facility], [address] [place], [place] [address]    Report Status 02/13/2024 FINAL  Final  Culture, Respiratory w Gram Stain     Status: None   Collection Time: 02/14/24 12:37 PM   Specimen: Tracheal Aspirate; Respiratory  Result Value Ref Range Status   Specimen Description TRACHEAL ASPIRATE  Final   Special Requests NONE  Final   Gram Stain   Final    RARE WBC SEEN MODERATE YEAST Performed at [facility], [address] [place], [place] [address]    Culture ABUNDANT CITROBACTER SPECIES  Final   Report Status Mar 14, 2024 FINAL  Final   Organism ID, Bacteria CITROBACTER SPECIES  Final      Susceptibility   Citrobacter species - MIC*    CEFEPIME  >=32 RESISTANT Resistant     ERTAPENEM <=0.12 SENSITIVE Sensitive     CEFTRIAXONE  >=64 RESISTANT Resistant     CIPROFLOXACIN  <=0.06 SENSITIVE Sensitive     GENTAMICIN <=1 SENSITIVE Sensitive     MEROPENEM <=0.25 SENSITIVE Sensitive     TRIMETH /SULFA  <=20 SENSITIVE Sensitive     PIP/TAZO Value in next row Resistant      >=128 RESISTANTThis is a modified FDA-approved test that has been validated and its performance characteristics determined by the reporting laboratory.  This laboratory is  certified under the Clinical Laboratory Improvement Amendments CLIA as qualified to perform high complexity clinical laboratory testing.    * ABUNDANT CITROBACTER SPECIES    Time spent: >30 minutes  Signed: Sammi Fredericks, MD 2024/03/14

## 2024-03-19 NOTE — Plan of Care (Signed)
  Problem: Clinical Measurements: Goal: Ability to maintain clinical measurements within normal limits will improve Outcome: Progressing Goal: Will remain free from infection Outcome: Progressing Goal: Diagnostic test results will improve Outcome: Progressing Goal: Respiratory complications will improve Outcome: Progressing Goal: Cardiovascular complication will be avoided Outcome: Progressing   Problem: Activity: Goal: Risk for activity intolerance will decrease Outcome: Progressing   Problem: Coping: Goal: Level of anxiety will decrease Outcome: Progressing   Problem: Elimination: Goal: Will not experience complications related to bowel motility Outcome: Progressing Goal: Will not experience complications related to urinary retention Outcome: Progressing   Problem: Pain Managment: Goal: General experience of comfort will improve and/or be controlled Outcome: Progressing   Problem: Safety: Goal: Ability to remain free from injury will improve Outcome: Progressing   Problem: Skin Integrity: Goal: Risk for impaired skin integrity will decrease Outcome: Progressing   Problem: Activity: Goal: Capacity to carry out activities will improve Outcome: Progressing   Problem: Cardiac: Goal: Ability to achieve and maintain adequate cardiopulmonary perfusion will improve Outcome: Progressing   Problem: Fluid Volume: Goal: Ability to maintain a balanced intake and output will improve Outcome: Progressing   Problem: Metabolic: Goal: Ability to maintain appropriate glucose levels will improve Outcome: Progressing   Problem: Nutritional: Goal: Maintenance of adequate nutrition will improve Outcome: Progressing Goal: Progress toward achieving an optimal weight will improve Outcome: Progressing   Problem: Skin Integrity: Goal: Risk for impaired skin integrity will decrease Outcome: Progressing   Problem: Activity: Goal: Ability to tolerate increased activity will  improve Outcome: Progressing   Problem: Respiratory: Goal: Ability to maintain a clear airway and adequate ventilation will improve Outcome: Progressing   Problem: Role Relationship: Goal: Method of communication will improve Outcome: Progressing   Problem: Safety: Goal: Non-violent Restraint(s) Outcome: Progressing

## 2024-03-19 NOTE — Significant Event (Signed)
 Had a prolonged conversation with patient's daughter Harlene. She lives in [place] . Per her he was conversant and doing well at home despite his medical comorbidities and she would want him to get better and come back home if possible.  She would want to continue all the measures that is needed to get him over the hump but she would not want him to suffer.  She understands that he is intubated right now.  I did explain to her that I am in the process of considering extubating him and if he fails extubation he might need a tracheostomy.  She understands and she is okay with tracheostomy if needed.  In the meanwhile she is hoping that once we do the tracheostomy and be able to surgically cleared the foot with amputation/debridement he will improve.  I did explain to her that he is altered which is likely because of his underlying hospitalization and his infection.  At the end what we have decided is that it is okay to reintubate the patient if needed and okay to proceed with tracheostomy if needed.

## 2024-03-19 NOTE — Code Documentation (Signed)
 CODE DOCUMENTATION No CPR; shock and meds only  Patient Name: David [name]   MRN: [id number]   Date of Birth/ Sex: 1945-07-12 , male      Admission Date: 02/03/2024  Attending Provider: Theodoro Lakes, MD  Primary Diagnosis: Acute CHF [facility]) [I50.9]   Indication: Pt was in his usual state of health until this PM, when he was noted to be hypotensive at 1622 2024/02/24. Primary team in the ICU responded immediately as patient's code status was DNR with interventions: no compressions but desired defibrillation if needed, medications, and continued intubation.    Technical Description:  - CPR performance duration:  No CPR  - Was defibrillation or cardioversion used? Yes; defibrillation  - Was external pacer placed? No  - Was patient intubated pre/post CPR? Yes; prior to ACLS (no CPR)   Medications Administered: Y = Yes; Blank = No Amiodarone    Atropine     Calcium     Epinephrine   2  Lidocaine     Magnesium   1  Norepinephrine     Phenylephrine     Sodium bicarbonate     Vasopressin    Other    Post CPR evaluation:  - Final Status - Was patient successfully resuscitated ? No   Miscellaneous Information:  - Time of death:  54   - Primary team notified?  Yes  - Family Notified? Yes     Elnora Ip, MD   24-Feb-2024, 4:57 PM

## 2024-03-19 NOTE — Progress Notes (Signed)
 Rt transported pt on vent to CT and back with no complications with Rnx1.

## 2024-03-19 DEATH — deceased
